# Patient Record
Sex: Male | Born: 1950
Health system: Southern US, Community
[De-identification: ages and names within clinical notes are randomized; demographics above are authoritative.]

## PROBLEM LIST (undated history)

## (undated) DIAGNOSIS — E039 Hypothyroidism, unspecified: Secondary | ICD-10-CM

## (undated) DIAGNOSIS — E079 Disorder of thyroid, unspecified: Secondary | ICD-10-CM

## (undated) DIAGNOSIS — T7840XA Allergy, unspecified, initial encounter: Secondary | ICD-10-CM

## (undated) DIAGNOSIS — M1990 Unspecified osteoarthritis, unspecified site: Secondary | ICD-10-CM

## (undated) DIAGNOSIS — K219 Gastro-esophageal reflux disease without esophagitis: Secondary | ICD-10-CM

## (undated) DIAGNOSIS — J45909 Unspecified asthma, uncomplicated: Secondary | ICD-10-CM

## (undated) DIAGNOSIS — D649 Anemia, unspecified: Secondary | ICD-10-CM

## (undated) DIAGNOSIS — E785 Hyperlipidemia, unspecified: Secondary | ICD-10-CM

## (undated) DIAGNOSIS — I10 Essential (primary) hypertension: Secondary | ICD-10-CM

## (undated) DIAGNOSIS — F32A Depression, unspecified: Secondary | ICD-10-CM

## (undated) DIAGNOSIS — F329 Major depressive disorder, single episode, unspecified: Secondary | ICD-10-CM

## (undated) DIAGNOSIS — H532 Diplopia: Secondary | ICD-10-CM

## (undated) HISTORY — DX: Major depressive disorder, single episode, unspecified: F32.9

## (undated) HISTORY — DX: Anemia, unspecified: D64.9

## (undated) HISTORY — PX: TONSILLECTOMY: SUR1361

## (undated) HISTORY — DX: Gastro-esophageal reflux disease without esophagitis: K21.9

## (undated) HISTORY — DX: Essential (primary) hypertension: I10

## (undated) HISTORY — DX: Depression, unspecified: F32.A

## (undated) HISTORY — DX: Hyperlipidemia, unspecified: E78.5

## (undated) HISTORY — DX: Disorder of thyroid, unspecified: E07.9

## (undated) HISTORY — DX: Allergy, unspecified, initial encounter: T78.40XA

---

## 2000-06-21 ENCOUNTER — Other Ambulatory Visit: Admission: RE | Admit: 2000-06-21 | Discharge: 2000-06-21 | Payer: Self-pay | Admitting: Urology

## 2003-11-24 ENCOUNTER — Emergency Department (HOSPITAL_COMMUNITY): Admission: EM | Admit: 2003-11-24 | Discharge: 2003-11-24 | Payer: Self-pay | Admitting: Emergency Medicine

## 2003-11-25 HISTORY — PX: OTHER SURGICAL HISTORY: SHX169

## 2004-09-07 ENCOUNTER — Ambulatory Visit (HOSPITAL_BASED_OUTPATIENT_CLINIC_OR_DEPARTMENT_OTHER): Admission: RE | Admit: 2004-09-07 | Discharge: 2004-09-07 | Payer: Self-pay | Admitting: Internal Medicine

## 2004-09-30 ENCOUNTER — Ambulatory Visit: Payer: Self-pay | Admitting: Internal Medicine

## 2004-11-01 ENCOUNTER — Ambulatory Visit: Payer: Self-pay | Admitting: Internal Medicine

## 2004-11-02 ENCOUNTER — Encounter (INDEPENDENT_AMBULATORY_CARE_PROVIDER_SITE_OTHER): Payer: Self-pay | Admitting: *Deleted

## 2004-11-08 ENCOUNTER — Encounter (INDEPENDENT_AMBULATORY_CARE_PROVIDER_SITE_OTHER): Payer: Self-pay | Admitting: *Deleted

## 2004-11-14 ENCOUNTER — Encounter (INDEPENDENT_AMBULATORY_CARE_PROVIDER_SITE_OTHER): Payer: Self-pay | Admitting: *Deleted

## 2004-11-18 ENCOUNTER — Encounter (INDEPENDENT_AMBULATORY_CARE_PROVIDER_SITE_OTHER): Payer: Self-pay | Admitting: *Deleted

## 2004-11-25 ENCOUNTER — Ambulatory Visit: Payer: Self-pay | Admitting: Internal Medicine

## 2004-12-01 ENCOUNTER — Ambulatory Visit: Payer: Self-pay | Admitting: Internal Medicine

## 2004-12-01 ENCOUNTER — Encounter (INDEPENDENT_AMBULATORY_CARE_PROVIDER_SITE_OTHER): Payer: Self-pay | Admitting: *Deleted

## 2004-12-06 ENCOUNTER — Encounter: Payer: Self-pay | Admitting: Internal Medicine

## 2004-12-26 ENCOUNTER — Ambulatory Visit: Payer: Self-pay | Admitting: Cardiology

## 2004-12-26 ENCOUNTER — Encounter: Payer: Self-pay | Admitting: Internal Medicine

## 2005-11-03 ENCOUNTER — Encounter (INDEPENDENT_AMBULATORY_CARE_PROVIDER_SITE_OTHER): Payer: Self-pay | Admitting: *Deleted

## 2009-09-18 HISTORY — PX: BONE MARROW TRANSPLANT: SHX200

## 2010-05-02 ENCOUNTER — Ambulatory Visit: Payer: Self-pay | Admitting: Internal Medicine

## 2010-05-02 ENCOUNTER — Inpatient Hospital Stay (HOSPITAL_COMMUNITY): Admission: EM | Admit: 2010-05-02 | Discharge: 2010-05-06 | Payer: Self-pay | Admitting: Emergency Medicine

## 2010-05-03 ENCOUNTER — Ambulatory Visit: Payer: Self-pay | Admitting: Gastroenterology

## 2010-05-04 ENCOUNTER — Encounter: Payer: Self-pay | Admitting: Internal Medicine

## 2010-05-04 ENCOUNTER — Ambulatory Visit: Payer: Self-pay | Admitting: Cardiology

## 2010-05-04 ENCOUNTER — Encounter: Payer: Self-pay | Admitting: Gastroenterology

## 2010-05-05 ENCOUNTER — Encounter: Payer: Self-pay | Admitting: Gastroenterology

## 2010-05-06 ENCOUNTER — Encounter: Payer: Self-pay | Admitting: Internal Medicine

## 2010-05-30 DIAGNOSIS — R7402 Elevation of levels of lactic acid dehydrogenase (LDH): Secondary | ICD-10-CM | POA: Insufficient documentation

## 2010-05-30 DIAGNOSIS — D638 Anemia in other chronic diseases classified elsewhere: Secondary | ICD-10-CM | POA: Insufficient documentation

## 2010-05-30 DIAGNOSIS — K279 Peptic ulcer, site unspecified, unspecified as acute or chronic, without hemorrhage or perforation: Secondary | ICD-10-CM | POA: Insufficient documentation

## 2010-05-30 DIAGNOSIS — K449 Diaphragmatic hernia without obstruction or gangrene: Secondary | ICD-10-CM | POA: Insufficient documentation

## 2010-05-30 DIAGNOSIS — G4730 Sleep apnea, unspecified: Secondary | ICD-10-CM | POA: Insufficient documentation

## 2010-05-30 DIAGNOSIS — E785 Hyperlipidemia, unspecified: Secondary | ICD-10-CM | POA: Insufficient documentation

## 2010-05-30 DIAGNOSIS — D649 Anemia, unspecified: Secondary | ICD-10-CM | POA: Insufficient documentation

## 2010-05-30 DIAGNOSIS — R740 Nonspecific elevation of levels of transaminase and lactic acid dehydrogenase [LDH]: Secondary | ICD-10-CM

## 2010-05-30 DIAGNOSIS — K222 Esophageal obstruction: Secondary | ICD-10-CM | POA: Insufficient documentation

## 2010-05-30 DIAGNOSIS — J45909 Unspecified asthma, uncomplicated: Secondary | ICD-10-CM | POA: Insufficient documentation

## 2010-05-30 DIAGNOSIS — R7401 Elevation of levels of liver transaminase levels: Secondary | ICD-10-CM | POA: Insufficient documentation

## 2010-05-31 ENCOUNTER — Ambulatory Visit: Payer: Self-pay | Admitting: Internal Medicine

## 2010-05-31 LAB — CONVERTED CEMR LAB
Eosinophils Absolute: 0.3 10*3/uL (ref 0.0–0.7)
Lymphs Abs: 0.6 10*3/uL — ABNORMAL LOW (ref 0.7–4.0)
MCHC: 34.4 g/dL (ref 30.0–36.0)
MCV: 98.1 fL (ref 78.0–100.0)
Monocytes Absolute: 0.4 10*3/uL (ref 0.1–1.0)
Neutrophils Relative %: 49.9 % (ref 43.0–77.0)
Platelets: 190 10*3/uL (ref 150.0–400.0)

## 2010-06-02 ENCOUNTER — Ambulatory Visit: Payer: Self-pay | Admitting: Internal Medicine

## 2010-06-04 LAB — CONVERTED CEMR LAB
AST: 44 units/L — ABNORMAL HIGH (ref 0–37)
Albumin: 3.8 g/dL (ref 3.5–5.2)
Alkaline Phosphatase: 72 units/L (ref 39–117)
BUN: 22 mg/dL (ref 6–23)
Ferritin: 40.1 ng/mL (ref 22.0–322.0)
Folate: >20 ng/mL
Potassium: 5.5 meq/L — ABNORMAL HIGH (ref 3.5–5.1)
Saturation Ratios: 23.3 % (ref 20.0–50.0)
Sodium: 139 meq/L (ref 135–145)
Total Bilirubin: 0.4 mg/dL (ref 0.3–1.2)
Total Protein: 8.3 g/dL (ref 6.0–8.3)
Transferrin: 282.6 mg/dL (ref 212.0–360.0)

## 2010-06-22 ENCOUNTER — Ambulatory Visit: Payer: Self-pay | Admitting: Internal Medicine

## 2010-06-22 LAB — CONVERTED CEMR LAB
Basophils Absolute: 0 10*3/uL (ref 0.0–0.1)
Eosinophils Absolute: 0.2 10*3/uL (ref 0.0–0.7)
Hemoglobin: 11.6 g/dL — ABNORMAL LOW (ref 13.0–17.0)
Iron: 80 ug/dL (ref 42–165)
Lymphocytes Relative: 41.1 % (ref 12.0–46.0)
MCHC: 35.1 g/dL (ref 30.0–36.0)
Monocytes Relative: 8.9 % (ref 3.0–12.0)
Neutro Abs: 1.5 10*3/uL (ref 1.4–7.7)
Neutrophils Relative %: 43.4 % (ref 43.0–77.0)
RDW: 19 % — ABNORMAL HIGH (ref 11.5–14.6)
Saturation Ratios: 19.4 % — ABNORMAL LOW (ref 20.0–50.0)
Transferrin: 294.6 mg/dL (ref 212.0–360.0)

## 2010-07-18 ENCOUNTER — Ambulatory Visit: Payer: Self-pay | Admitting: Internal Medicine

## 2010-07-18 LAB — CONVERTED CEMR LAB
Basophils Absolute: 0 10*3/uL (ref 0.0–0.1)
Eosinophils Relative: 4.6 % (ref 0.0–5.0)
Iron: 102 ug/dL (ref 42–165)
Lymphocytes Relative: 23.8 % (ref 12.0–46.0)
Lymphs Abs: 1.1 10*3/uL (ref 0.7–4.0)
Monocytes Relative: 8.3 % (ref 3.0–12.0)
Neutrophils Relative %: 63 % (ref 43.0–77.0)
Platelets: 211 10*3/uL (ref 150.0–400.0)
RDW: 18.3 % — ABNORMAL HIGH (ref 11.5–14.6)
Transferrin: 287.2 mg/dL (ref 212.0–360.0)
WBC: 4.5 10*3/uL (ref 4.5–10.5)

## 2010-08-15 ENCOUNTER — Ambulatory Visit: Payer: Self-pay | Admitting: Cardiology

## 2010-08-18 ENCOUNTER — Ambulatory Visit: Payer: Self-pay | Admitting: Cardiology

## 2010-08-29 ENCOUNTER — Ambulatory Visit: Payer: Self-pay | Admitting: Internal Medicine

## 2010-09-01 LAB — CONVERTED CEMR LAB
Basophils Relative: 0.3 % (ref 0.0–3.0)
Eosinophils Absolute: 0.2 10*3/uL (ref 0.0–0.7)
Eosinophils Relative: 6 % — ABNORMAL HIGH (ref 0.0–5.0)
Lymphocytes Relative: 24.5 % (ref 12.0–46.0)
MCV: 99.1 fL (ref 78.0–100.0)
Monocytes Absolute: 0.5 10*3/uL (ref 0.1–1.0)
Neutrophils Relative %: 56.5 % (ref 43.0–77.0)
Platelets: 225 10*3/uL (ref 150.0–400.0)
RBC: 3.57 M/uL — ABNORMAL LOW (ref 4.22–5.81)
WBC: 3.9 10*3/uL — ABNORMAL LOW (ref 4.5–10.5)

## 2010-09-19 ENCOUNTER — Ambulatory Visit: Payer: Self-pay | Admitting: Internal Medicine

## 2010-09-22 ENCOUNTER — Other Ambulatory Visit: Payer: Self-pay | Admitting: Internal Medicine

## 2010-09-22 ENCOUNTER — Encounter: Payer: Self-pay | Admitting: Internal Medicine

## 2010-09-22 LAB — CBC WITH DIFFERENTIAL/PLATELET
Basophils Absolute: 0 10*3/uL (ref 0.0–0.1)
Basophils Relative: 0.2 % (ref 0.0–3.0)
Eosinophils Absolute: 0.1 10*3/uL (ref 0.0–0.7)
Eosinophils Relative: 2.8 % (ref 0.0–5.0)
HCT: 36.5 % — ABNORMAL LOW (ref 39.0–52.0)
Hemoglobin: 12.5 g/dL — ABNORMAL LOW (ref 13.0–17.0)
Lymphocytes Relative: 29.9 % (ref 12.0–46.0)
Lymphs Abs: 1.4 10*3/uL (ref 0.7–4.0)
MCHC: 34.3 g/dL (ref 30.0–36.0)
MCV: 101.2 fl — ABNORMAL HIGH (ref 78.0–100.0)
Monocytes Absolute: 0.5 10*3/uL (ref 0.1–1.0)
Monocytes Relative: 9.4 % (ref 3.0–12.0)
Neutro Abs: 2.8 10*3/uL (ref 1.4–7.7)
Neutrophils Relative %: 57.7 % (ref 43.0–77.0)
Platelets: 217 10*3/uL (ref 150.0–400.0)
RBC: 3.61 Mil/uL — ABNORMAL LOW (ref 4.22–5.81)
RDW: 17 % — ABNORMAL HIGH (ref 11.5–14.6)
WBC: 4.8 10*3/uL (ref 4.5–10.5)

## 2010-09-22 LAB — CONVERTED CEMR LAB: Retic Ct Pct: 1.2 % (ref 0.4–3.1)

## 2010-09-23 LAB — HEPATIC FUNCTION PANEL
ALT: 36 U/L (ref 0–53)
AST: 37 U/L (ref 0–37)
Albumin: 3.6 g/dL (ref 3.5–5.2)
Alkaline Phosphatase: 69 U/L (ref 39–117)
Bilirubin, Direct: 0.1 mg/dL (ref 0.0–0.3)
Total Bilirubin: 0.4 mg/dL (ref 0.3–1.2)
Total Protein: 8.4 g/dL — ABNORMAL HIGH (ref 6.0–8.3)

## 2010-09-23 LAB — FERRITIN: Ferritin: 56.6 ng/mL (ref 22.0–322.0)

## 2010-09-23 LAB — IBC PANEL
Iron: 111 ug/dL (ref 42–165)
Saturation Ratios: 28.2 % (ref 20.0–50.0)
Transferrin: 281.3 mg/dL (ref 212.0–360.0)

## 2010-09-23 LAB — B12 AND FOLATE PANEL
Folate: 15.7 ng/mL
Vitamin B-12: 967 pg/mL — ABNORMAL HIGH (ref 211–911)

## 2010-09-29 ENCOUNTER — Encounter: Payer: Self-pay | Admitting: Internal Medicine

## 2010-09-29 ENCOUNTER — Ambulatory Visit
Admission: RE | Admit: 2010-09-29 | Discharge: 2010-09-29 | Payer: Self-pay | Source: Home / Self Care | Attending: Internal Medicine | Admitting: Internal Medicine

## 2010-10-04 LAB — CONVERTED CEMR LAB
Albumin ELP: 48.3 % — ABNORMAL LOW (ref 55.8–66.1)
Alpha-2-Globulin: 8.3 % (ref 7.1–11.8)
Total Protein, Serum Electrophoresis: 9.2 g/dL — ABNORMAL HIGH (ref 6.0–8.3)

## 2010-10-07 ENCOUNTER — Telehealth (INDEPENDENT_AMBULATORY_CARE_PROVIDER_SITE_OTHER): Payer: Self-pay | Admitting: *Deleted

## 2010-10-10 ENCOUNTER — Ambulatory Visit (HOSPITAL_BASED_OUTPATIENT_CLINIC_OR_DEPARTMENT_OTHER): Payer: PRIVATE HEALTH INSURANCE | Admitting: Oncology

## 2010-10-11 LAB — CONVERTED CEMR LAB
IgG (Immunoglobin G), Serum: 4190 mg/dL — ABNORMAL HIGH (ref 694–1618)
IgM, Serum: 28 mg/dL — ABNORMAL LOW (ref 60–263)
Total Protein, Serum Electrophoresis: 10.1 g/dL — ABNORMAL HIGH (ref 6.0–8.3)

## 2010-10-14 ENCOUNTER — Ambulatory Visit (HOSPITAL_COMMUNITY)
Admission: RE | Admit: 2010-10-14 | Discharge: 2010-10-14 | Payer: Self-pay | Source: Home / Self Care | Attending: Oncology | Admitting: Oncology

## 2010-10-18 NOTE — Letter (Signed)
Summary: [facility])  [name])   Imported By: Lamona Curl CMA (AAMA) 07/15/2010 17:07:01  _____________________________________________________________________  External Attachment:    Type:   Image     Comment:   External Document

## 2010-10-18 NOTE — Assessment & Plan Note (Signed)
Summary: post hospital dueodenal ulcer/GI bleed   History of Present Illness Visit Type: Initial Visit Primary GI MD: Lina Sar MD Primary Provider: Dr. Patty Sermons Chief Complaint: follow-up hospitalization for duodenal ulcer c/o weakness, dizziness, and BP drops when standing History of Present Illness:   This is a 60 year old white male who is status post hospitalization for a bleeding duodenal ulcer. He had an urgent  upper endoscopy on 05/03/10 with findings of 3 small bleeding duodenal ulcers which were injected with epinephrine. He was H. pylori negative. His hemoglobin on discharge was 10.1. He has been on Protonix 40 mg once a day. His hemoglobin 2 days ago was 10.9. He has been extremely tired and not able to do his usual exercises which includes triathlon, biking and running. His last colonoscopy in 2007 was normal. He has a history of abnormal liver function tests which are related to statins for hyperlipidemia. He denies any abdominal pain or black stools except those caused by oral iron.   GI Review of Systems      Denies abdominal pain, acid reflux, belching, bloating, chest pain, dysphagia with liquids, dysphagia with solids, heartburn, loss of appetite, nausea, vomiting, vomiting blood, weight loss, and  weight gain.        Denies anal fissure, black tarry stools, change in bowel habit, constipation, diarrhea, diverticulosis, fecal incontinence, heme positive stool, hemorrhoids, irritable bowel syndrome, jaundice, light color stool, liver problems, rectal bleeding, and  rectal pain.    Current Medications (verified): 1)  Pantoprazole Sodium 40 Mg Tbec (Pantoprazole Sodium) .... Take 1 Tablet By Mouth Two Times A Day 2)  Zocor 10 Mg Tabs (Simvastatin) .... Take 1 Tablet By Mouth Two Times A Day 3)  Melatonin (Unknown Dosage) .... Take 1 Tablet By Mouth As Needed As Needed For Insomnia 4)  Iron 325 (65 Fe) Mg Tabs (Ferrous Sulfate) .[name].. 1 By Mouth Once Daily 5)  Fish Oil 1000  Mg Caps (Omega-3 Fatty Acids) .[name].. 1 By Mouth Once Daily 6)  Selenium 200 Mcg Tabs (Selenium) .... 2 By Mouth Once Daily 7)  Vitamin C 1000 Mg Tabs (Ascorbic Acid) .... 2 By Mouth Once Daily 8)  Zetia 10 Mg Tabs (Ezetimibe) .[name].. 1 By Mouth Once Daily 9)  Allergy 4 Mg Tabs (Chlorpheniramine Maleate) .[name].. 1 By Mouth Once Daily 10)  5-Htp 50 Mg Caps (5-Hydroxytryptophan) .[name].. 1 By Mouth Once Daily 11)  Vitamin A .[name].. 1 By Mouth Once Daily  Allergies (verified): No Known Drug Allergies  Past History:  Past Medical History: Current Problems:  SCHATZKI'S RING (ICD-530.3) HIATAL HERNIA (ICD-553.3) ANEMIA (ICD-285.9) PEPTIC ULCER DISEASE (ICD-533.90) DEPRESSION (ICD-311) ASTHMA, UNSPECIFIED (ICD-493.90) HYPERLIPIDEMIA (ICD-272.4) TRANSAMINASES, SERUM, ELEVATED (ICD-790.4) SLEEP APNEA, MILD (ICD-780.57)   Anxiety Disorder  Past Surgical History: Reviewed history from 05/30/2010 and no changes required. Unremarkable  Family History: Reviewed history from 05/30/2010 and no changes required. No FH of Colon Cancer: Family History of Prostate Cancer: Maternal GrandFather Family History of Diabetes: Father Family History of Heart Disease: Father Family History of Liver Disease: Brother (hep.C) Family History of Pancreatic Cancer:Brother  Social History: Reviewed history from 05/30/2010 and no changes required. Patient currently smokes.   Quit  Alcohol Use - yes Illicit Drug Use - no Daily Caffeine Use  Review of Systems       The patient complains of allergy/sinus, anxiety-new, depression-new, itching, skin rash, and sleeping problems.  The patient denies anemia, back pain, blood in urine, breast changes/lumps, change in vision, confusion, cough, coughing up blood,  fainting, fatigue, fever, headaches-new, hearing problems, heart murmur, heart rhythm changes, menstrual pain, muscle pains/cramps, night sweats, nosebleeds, pregnancy symptoms, shortness of breath, sore throat, swelling  of feet/legs, swollen lymph glands, thirst - excessive , urination - excessive , urination changes/pain, urine leakage, vision changes, and voice change.         Pertinent positive and negative review of systems were noted in the above HPI. All other ROS was otherwise negative.   Vital Signs:  Patient profile:   60 year old male Height:      70 inches Weight:      175 pounds BMI:     25.20 Pulse rate:   64 / minute Pulse rhythm:   regular BP sitting:   120 / 76  (left arm)  Vitals Entered By: [name] (June 02, 2010 9:08 AM)  Physical Exam  General:  Well developed, well nourished, no acute distress. Neck:  Supple; no masses or thyromegaly. Lungs:  Clear throughout to auscultation. Heart:  Regular rate and rhythm; no murmurs, rubs,  or bruits. Abdomen:  Soft, nontender and nondistended. No masses, hepatosplenomegaly or hernias noted. Normal bowel sounds. Rectal:  soft Hemoccult negative stool. Extremities:  No clubbing, cyanosis, edema or deformities noted. Skin:  Intact without significant lesions or rashes. Psych:  Alert and cooperative. Normal mood and affect.   Impression & Recommendations:  Problem # 1:  ANEMIA (ICD-285.9) persistent anemia following upper GI bleed. Patient just started on iron supplements. I have given him  samples of Integra to take on a daily basis. We will check his chemistries today. Orders: TLB-CMP (Comprehensive Metabolic Pnl) (80053-COMP) TLB-B12, Serum-Total ONLY (93235-T73) TLB-Folic Acid (Folate) (82746-FOL) TLB-IBC Pnl (Iron/FE;Transferrin) (83550-IBC) TLB-Ferritin (82728-FER) TLB-Sedimentation Rate (ESR) (85652-ESR) TLB-TSH (Thyroid Stimulating Hormone) (84443-TSH)  Problem # 2:  PEPTIC ULCER DISEASE (ICD-533.90) Patient is status post major upper GI bleed from H.Pylori  duodenal ulcers. He is currently stable hemodynamically. He needs to continue on Protonix 40 mg twice a day for 2 weeks and then he can reduce to once  daily. I will see him in 8 weeks. As far as his exercises are concerned, I have asked him to reduce it at least 50%. Orders: TLB-CMP (Comprehensive Metabolic Pnl) (80053-COMP) TLB-B12, Serum-Total ONLY (22025-K27) TLB-Folic Acid (Folate) (82746-FOL) TLB-IBC Pnl (Iron/FE;Transferrin) (83550-IBC) TLB-Ferritin (82728-FER)  Patient Instructions: 1)  Reduce exercise to 50%. 2)  Integra Plus one p.o. q.d.[name] 3)   metabolic panel, sedimentation rate, anemia panel, TSH. 4)  Protonix 40 mg p.o. Erland.i.d. 5)  Office visit 8 weeks. 6)  Copy sent to : Dr Patty Sermons 7)  The medication list was reviewed and reconciled.  All changed / newly prescribed medications were explained.  A complete medication list was provided to the patient / caregiver.

## 2010-10-18 NOTE — Letter (Signed)
Summary: Letter to Patient from [facility]  Letter to Patient from [facility]   Imported By: Lamona Curl CMA (AAMA) 06/01/2010 16:21:30  _____________________________________________________________________  External Attachment:    Type:   Image     Comment:   External Document

## 2010-10-18 NOTE — Assessment & Plan Note (Signed)
Summary: Gastroenterology  DURWIN DAVISSON MR#:  [id number] Page #  NAME:  Andre [name], Andre [name]  OFFICE NO:  [id number]  DATE:  11/25/04  DOB:  2050/09/29  HISTORY OF PRESENT ILLNESS:  The patient is a 60 year old white male who comes for evaluation of abnormal liver function tests.  We had received a call from him yesterday, and he faxed [place] the results from his recent physical at [place], which showed elevation of his transaminases.  We also have received copies of his liver function tests, which were repeated yesterday at Dr. Yevonne Pax office.  The liver function tests are as follows:  In May 2004, liver function tests were normal prior to him starting Lipitor.  The next liver function test done in June 2005 showed elevation of transaminases but normal bilirubin and normal alkaline phosphatase.  Lipitor was discontinued, and he was subsequently put on Crestor, which he stayed on, and then at some point changed to Zetia.  Liver tests November 01, 2004, showed AST 38, ALT 54, alkaline phosphatase 78, bilirubin 7 with GGT 152.  At that time, his iron saturation was 30%, hepatitis A antibody IgG was positive, IgM was negative, anti-HBc was negative, anti-HBs was negative, and hepatitis Osias surface antigen was negative.  Also, his hepatitis C antibody was negative.  His TSH was normal.  He was taken off his lipid-lowering agents, and on November 10, 2004, AST was 138 with ALT of 127, normal alkaline phosphatase, bilirubin, elevation of GGT to 207.  Next liver test check was on November 18, 2004 - AST 50, ALT 69, and GGT of 157 with normal rest of the liver function tests.  Yesterday in Dr. Yevonne Pax office, his repeat liver function tests on November 23, 2004, showed AST 98, ALT 101 with normal alkaline phosphatase 98, total bilirubin 0.3.  The patient has no history of liver problems.  He is feeling well energy wise and has no digestive problems to suggest gallbladder disease.  In fact, he had an upper abdominal ultrasound in  [facility] 2 weeks ago, which showed normal liver.  Splenic size was not described in the note, but the gallbladder appeared entirely normal, and common bile duct was normal size.  The patient uses alcohol socially.  He drinks 1 or 2 glasses of wine a day and about 1 or 2 drinks a week.  About once or twice a month, he and his wife drink a whole bottle of wine.  He has many days that he does not drink any alcohol and some days he drinks 1 or 2 beers but no other liquor.  He takes 2 Tylenol PM at night every night.  His brother was diagnosed with hepatitis C.  He was using drugs as a teenager and was in drug rehabilitation, and he is being treated for hepatitis C.  The patient lived with him in college when his brother was using drugs.  The patient himself has never used intravenous drugs or any types of injections.  He has never had blood transfusions.  He tried once to give blood at the age of about 64 but was refused for it.  He does not know the reason for it.  There is no other family history of liver disease.    MEDICATIONS:  Zetia 10 mg daily was discontinued about 2 weeks ago, Nexium 40 mg a day for acid reflux, Zoloft 200 mg q.h.s. was discontinued, Allegra 180 mg p.o. daily, Advair 100/50 p.r.n., Tylenol PM 2 at bedtime, Crestor 10 mg  was discontinued, Ziac 2.5/6.25 mg p.o. daily, and albuterol inhaler.   PAST HISTORY:  Significant for high blood pressure, asthmatic bronchitis, sleep apnea, hyperlipidemia, depression.  No operations.   FAMILY HISTORY:  Positive for heart disease and prostate cancer in his father and diabetes, hepatitis C in one brother.  SOCIAL HISTORY:  He is married.  He has 2 children.  College graduate.  He is a Animator.  He smokes cigars 2-3 a week.  Drinks alcohol as mentioned above.   REVIEW OF SYSTEMS:  Allergies, skin rashes diagnosed as eczema, sleeping problems, night sweats, itching.   PHYSICAL EXAMINATION:  Blood pressure 110/68.  Weight pounds  196 pounds.  Pulse 66.  He was in no distress.  Skin was warm and dry.  There was no palmar erythema, no Dupuytren contractions.  Sclerae were anicteric.  There were no spider nevi anywhere throughout his anterior chest or arms.  Also, there was no visible eczema.  Lungs were clear to auscultation.  Neck was supple.  Thyroid was not enlarged.  COR with quiet precordium; normal S1; normal S2.  Abdomen was soft and relaxed.  The liver edge was at the costal margin.  On percussion, I could not percuss the upper border of his liver because of increased tympany, but his liver was not enlarged and did not appear to be tender.  Splenic tip was not palpable.  Bowel sounds were normoactive.  Rectal exam not done.  Extremities:  No edema.    IMPRESSION:  A 60 year old white male with mild elevation of transaminases with normal bilirubin.  We have documented normal liver function tests from 2 years ago.  Liver abnormalities seem to have been documented since he was started on statin agents for hyperlipidemia.  The more likely reason for abnormal liver function tests would be then drug related.  Other possibility would be fatty liver.  Although as reported from the ultrasound from [place], his liver texture appeared normal.  He has no symptoms to suggest biliary dysfunction and structurally his gallbladder appears normal on the ultrasound.  Again, this is just from the report.  Other possibility would be some hereditary liver disease such as autoimmune liver disease, alpha-1 antitrypsin, unlikely Wilson's disease.  He has been checked for hepatitis A, Jakyle, and C, but we may have to repeat his hepatitis C serologies because his brother has hepatitis C, and patient has been exposed to him over the years.  We will check hepatitis C RNA by PCR.  He certainly does not show any evidence of chronic liver disease or chronic dysfunction of the liver as evidenced by normal serum albumin levels.    PLAN:  1.  I would like to  personally review the upper abdominal ultrasound to look at the splenic size and the texture of the liver.  2.  Patient has discontinued alcohol and Tylenol as well as statin medications.   3.  Obtain alpha-1 antitrypsin and ceruloplasmin levels, IgG and IgM levels, and ANA titer as well as prothrombin time.  I would hold off on his hyperlipidemic agents until we can assess the synthetic liver function.  If we do not get the upper abdominal ultrasound for review, we may need to obtain our own ultrasound to get more information about his liver and spleen.    I have told the patient that his liver impairment is not serious and that often we may need to observe the liver functions over a period of a year or 2 to see  the pattern.  Nothing necessarily has to be done about it.  I do not feel that a liver biopsy needs to be done right away unless we would see some benefit in potential treatment.        Hedwig Morton. Juanda Chance, M.D.  [id number] cc:  Cassell Clement, MD cc:  Macario Carls, MD, Internal Medicine       [facility]       [address]       [place], [place]  [address]  D:  11/25/04; T:  ; Job [phone]

## 2010-10-18 NOTE — Letter (Signed)
Summary: New Patient letter  [name]  [address] [place], [place] [address]   Phone: [phone]  Fax: [phone]       05/06/2010 MRN: [id number]  [facility] [address] [place], [place]  [address]  Dear Andre [name],  Welcome to the Gastroenterology Division at [facility].    You are scheduled to see Dr.  Juanda Chance  on 05/02/10 at 8:45 on the 3rd floor at [facility], [address]  We ask that you try to arrive at our office 15 minutes prior to your appointment time to allow for check-in.  We would like you to complete the enclosed self-administered evaluation form prior to your visit and bring it with you on the day of your appointment.  We will review it with you.  Also, please bring a complete list of all your medications or, if you prefer, bring the medication bottles and we will list them.  Please bring your insurance card so that we may make a copy of it.  If your insurance requires a referral to see a specialist, please bring your referral form from your primary care physician.  Co-payments are due at the time of your visit and may be paid by cash, check or credit card.     Your office visit will consist of a consult with your physician (includes a physical exam), any laboratory testing he/she may order, scheduling of any necessary diagnostic testing (e.g. x-ray, ultrasound, CT-scan), and scheduling of a procedure (e.g. Endoscopy, Colonoscopy) if required.  Please allow enough time on your schedule to allow for any/all of these possibilities.    If you cannot keep your appointment, please call [phone] to cancel or reschedule prior to your appointment date.  This allows [place] the opportunity to schedule an appointment for another patient in need of care.  If you do not cancel or reschedule by 5 p.m. the business day prior to your appointment date, you will be charged a $50.00 late cancellation/no-show fee.    Thank you for  choosing [facility] for your medical needs.  We appreciate the opportunity to care for you.  Please visit [place] at our website  to learn more about our practice.                     Sincerely,                                                             The Gastroenterology Division

## 2010-10-18 NOTE — Letter (Signed)
Summary: Gastroenterology  [facility]   Office No. [id number] Page December 06, 2004    Douglas L. Yetta Barre, MD [facility] [address] [place], [place] [address]  RE:     Andre [name]   Office No. [id number] DOB:  04/27/2051  Dear  Dr. Yetta Barre:   I had the pleasure of seeing your patient, [name], for evaluation of abnormal liver function tests. I appreciate your forwarding your complete and thorough physical exam from November 02, 2004, which included Andre [name] history as well as blood and urine tests and ultrasound of the liver and upper abdomen. I am enclosing my initial consultation note from November 25, 2004.  Andre [name] blood tests have come back showing mild elevation of autoimmune markers pertaining to possible mild inflammation of the liver. Specifically, his IgG gammaglobulin was elevated at 2077, while his IgM was normal. His ANA titer, however, was negative, but his anti-smooth muscle antibody IgG was elevated with a titer of 1:32, which is definitely positive, as well as the IgG titer of 1:20. As you remember, his sedimentation rate and C-reactive protein were mildly elevated.  Review of his upper abdominal ultrasound shows normal-sized spleen and no evidence of portal hypertension. His synthetic liver function was normal as far as his serum albumin and coagulation studies were concerned.  I have discussed this case with my associates, who agree that the patient most likely has a low-activity autoimmune liver disease which is not causing any impairment of the synthetic or other activities and probably is not definite enough to institute treatment, which in this case would have to be immunosuppressant medications. In fact, we would not recommend treatment. We would not recommend diagnostic liver biopsies because it would not likely result in any therapeutic intervention.  Our plan would be to continue to follow his liver function tests over a  period of the next 3 months and again in 6 months over a period of a year or two. If there is any evidence of a deterioration of the liver function or if he truly develops symptoms of autoimmune disease elsewhere, we would probably be interested in proceeding with a liver biopsy. I have talked to Andre [name] about it, and he agrees with the plans.  I will be in touch with you concerning his followup liver function tests, which will be repeated in June of 2006. It is a pleasure to take part in Andre [name] care.  Best regards,     Hedwig Morton. Juanda Chance, M.D.  Enclosure  [id number] D:  12/06/04; T:  ; Job [phone]

## 2010-10-18 NOTE — Procedures (Signed)
Summary: Upper Endoscopy  Patient: Andre [name] Note: All result statuses are Final unless otherwise noted.  Tests: (1) Upper Endoscopy (EGD)   EGD Upper Endoscopy       DONE     [facility]     [address]     [place], [place]  [address]           ENDOSCOPY PROCEDURE REPORT           PATIENT:  Andre [name], Andre [name]  MR#:  [id number]     BIRTHDATE:  04/05/1951, 58 yrs. old  GENDER:  male           ENDOSCOPIST:  Jamarquis Crull. Arlyce Dice, MD     Referred by:           PROCEDURE DATE:  05/03/2010     PROCEDURE:  EGD with biopsy     ASA CLASS:  Class II     INDICATIONS:  melena           MEDICATIONS:   Fentanyl 25 mcg IV, Versed 5 mg IV     TOPICAL ANESTHETIC:  Cetacaine Spray           DESCRIPTION OF PROCEDURE:   After the risks benefits and     alternatives of the procedure were thoroughly explained, informed     consent was obtained.  The EG-[id number] ([id number]) endoscope was     introduced through the mouth and advanced to the third portion of     the duodenum, without limitations.  The instrument was slowly     withdrawn as the mucosa was fully examined.     <<PROCEDUREIMAGES>>           Multiple ulcers were found in the bulb of the duodenum. 3 discreet     ulcers measuring 3-108mm at apex of bulb, 2 on anterior wall and 3rd     on posterior wall - all actively bleeding. 13cc epinephrine was     injected in divided portions into each ulcer achieving hemostasis.     Bx taken to r/o H. pylori (see image002, image005, image010, and     image011).  A Schatzki's ring was found at the gastroesophageal     junction (see image013).  A hiatal hernia was found. 3cm sliding     hiatal hernia  Otherwise the examination was normal.     Retroflexed views revealed no abnormalities.    The scope was then     withdrawn from the patient and the procedure completed.           COMPLICATIONS:  None           ENDOSCOPIC IMPRESSION:     1) Ulcers, multiple in the bulb of duodenum - s/p  epinephrine     injection with hemostasis     2) Schatzki's ring at the gastroesophageal junction     3) Hiatal hernia     4) Otherwise normal examination     RECOMMENDATIONS:Protonix drip           REPEAT EXAM:  No           ______________________________     Barbette Hair. Arlyce Dice, MD           CC:  Cassell Clement, MD           n.     Rosalie DoctorBarbette Hair. Kaplan at 05/03/2010 05:23 PM           Duwaine Maxin, [id number]  Note: An exclamation mark (!) indicates a result that was not dispersed into the flowsheet. Document Creation Date: 05/03/2010 5:25 PM _______________________________________________________________________  (1) Order result status: Final Collection or observation date-time: 05/03/2010 17:11 Requested date-time:  Receipt date-time:  Reported date-time:  Referring Physician:   Ordering Physician: Melvia Heaps [phone]) Specimen Source:  Source: Launa Grill Order Number: [id number] Lab site:

## 2010-10-18 NOTE — Letter (Signed)
Summary: [facility]  [facility]   Imported By: Lamona Curl CMA (AAMA) 06/01/2010 16:18:45  _____________________________________________________________________  External Attachment:    Type:   Image     Comment:   External Document

## 2010-10-18 NOTE — Letter (Signed)
Summary: [facility]  [employer]   Imported By: Lamona Curl CMA (AAMA) 06/01/2010 16:23:51  _____________________________________________________________________  External Attachment:    Type:   Image     Comment:   External Document

## 2010-10-18 NOTE — Letter (Signed)
Summary: Letter to pt from [facility]  Letter to pt from [facility]   Imported By: Lamona Curl CMA (AAMA) 06/01/2010 16:19:27  _____________________________________________________________________  External Attachment:    Type:   Image     Comment:   External Document

## 2010-10-18 NOTE — Letter (Signed)
Summary: [facility]   Imported By: Lamona Curl CMA (AAMA) 06/01/2010 16:23:13  _____________________________________________________________________  External Attachment:    Type:   Image     Comment:   External Document

## 2010-10-18 NOTE — Consult Note (Signed)
Summary: [place]  [facility]   Imported By: Lester Bloomfield 05/30/2010 11:03:21  _____________________________________________________________________  External Attachment:    Type:   Image     Comment:   External Document

## 2010-10-18 NOTE — Consult Note (Signed)
Summary: GI Bleed    [name], Andre [name]              ACCOUNT NO.:  [id number]      MEDICAL RECORD NO.:  [id number]           PATIENT TYPE:      LOCATION:                                 FACILITY:      PHYSICIAN:  Currie Paris, M.D.DATE OF BIRTH:  09/11/1951      DATE OF CONSULTATION:  05/04/2010   DATE OF DISCHARGE:                                    CONSULTATION      REQUESTING PHYSICIAN:  Ileana Roup, MD      GASTROENTEROLOGIST:  Barbette Hair. Arlyce Dice, MD, Clementeen Graham      REASON FOR CONSULTATION:  Upper GI bleed secondary to peptic ulcer   disease.      HISTORY OF PRESENT ILLNESS:  [name] is a 60 year old white male   with a history of hyperlipidemia but otherwise in good health who   developed weakness and shortness of breath this past Monday while at   work.  He is otherwise in very good health running marathons, biking,   and swimming.  After walking up 3 flights of steps, the patient   developed shortness of breath and worsening weakness.  Following a   meeting, he then developed sweats and felt like he was going to passed   out.  At this time, EMS was called and the patient was brought to the   emergency department.  At this time, workup was negative for any cardiac   indications; however, it did reveal anemia.  The patient then was found   to have heme-positive stools.  He does admit to having darker black   melanotic stools for the last week along with constipation.   Gastroenterology was consulted and an endoscopy was performed which   revealed three bleeding duodenal bulb ulcers with visible vessel.  These   were injected with epinephrine and bleeding ceased.  Overnight, the   patient did have a slight decrease in his hemoglobin to 7.9 and the   patient was transfused 2 units of packed red blood cells.  This morning,   his hemoglobin is up to 9.9.  The patient denies any abdominal pain at   any point in time.  At this time, we are asked to evaluate the  patient   to be on standby in case the patient were to rebleed and need emergent   surgical intervention.      REVIEW OF SYSTEMS:  Please see HPI.  Otherwise, all other systems are   negative.      FAMILY HISTORY:  Noncontributory.      PAST MEDICAL HISTORY:  Hyperlipidemia.      PAST SURGICAL HISTORY:  None.      SOCIAL HISTORY:  The patient has 2 daughters.  He is a Surveyor, minerals.  He   denies any alcohol or tobacco.      ALLERGIES:  NKDA.      Medications at home include:   1. Over-the-counter Tylenol p.r.n.   2. Melatonin 3 mg at bedtime daily as needed.   3. Selenium  400 mg daily.   4. Vitamin A.   5. Fish oil.   6. Vitamin C.   7. Over-the-counter 5-HTP.   8. Aspirin 81 mg daily.   9. Zyrtec 10 mg daily.   10.Doxycycline 100 mg Khyran.i.d.  He has been on a 14-day course.   11.Zetia 10 mg daily.      PHYSICAL EXAMINATION:  GENERAL:  [name] is a very pleasant 58-year-   old white male who is well developed and well nourished and currently in   bed in no acute distress.   VITAL SIGNS:  Temperature 97.9, pulse 62, blood pressure 129/64,   respirations are 16.   HEENT:  Head is normocephalic, atraumatic.  Sclerae noninjected.  Pupils   are equal, round, and reactive to light.  Ears and nose without any   obvious masses or lesions.  No rhinorrhea.  Mouth is pink and moist.   Throat shows no exudate.   HEART:  Regular rate and rhythm.  Normal S1 and S2.  No murmurs,   gallops, or rubs are noted.  He does have palpable carotid, radial, and   pedal pulses bilaterally.   LUNGS:  Clear to auscultation bilaterally with no wheezes, rhonchi, or   rales noted.  Respiratory effort is nonlabored.   ABDOMEN:  Soft, nontender, nondistended with active bowel sounds.  No   masses, scars, hernias, or organomegaly are noted.   MUSCULOSKELETAL:  All four extremities are symmetrical with no cyanosis,   clubbing, or edema.   NEUROLOGIC:  Cranial nerves II through XII appear to be grossly  intact.   Deep tendon reflex exam is deferred at this time.   PSYCHIATRIC:  The patient is alert and oriented x3 with an appropriate   affect.      LABORATORY DATA AND DIAGNOSTICS:  White blood cell count 7100,   hemoglobin 9.9 which is up to 7.9 overnight, hematocrit 29.5, platelet   count is 133,000.  Sodium 138, potassium 4.3, glucose 98, BUN 29,   creatinine 0.88.  Endoscopy revealed three discrete ulcers measuring 3-4   mm at the apex of the bulb.  They were all actively bleeding and   epinephrine was injected to achieve hemostasis.  Biopsy was taken to   rule out H. pylori which is currently pending.      IMPRESSION:   1. Upper gastrointestinal bleed secondary to three bleeding duodenal       bulb ulcers.   2. Hyperlipidemia.      PLAN:  At this time, the patient is currently stable and is tolerating   clear liquids.  I agree with GI evaluation and management with PPIs.  If   the patient remains stable, he will need appropriate GI follow up.   However, if the patient begins to acutely bleed, he may require urgent   or emergent surgical intervention for which we will be available for.   We will follow the patient along with you in case we are needed.   Otherwise, I agree with the current care that the patient is receiving   by his primary medical team as well as the gastroenterologist.               Letha Cape, PA         ______________________________   Currie Paris, M.D.      KEO/MEDQ  D:  05/04/2010  T:  05/04/2010  Job:  [id number]      cc:   Nilda Simmer.  Meredith Pel, M.D.   Barbette Hair. Arlyce Dice, MD,FACG      Electronically Signed by Barnetta Chapel PA on 05/12/2010 10:17:54 AM   Electronically Signed by Cyndia Bent M.D. on 05/12/2010 12:21:20 PM

## 2010-10-18 NOTE — Letter (Signed)
Summary: [facility]  [facility]   Imported By: Lamona Curl CMA (AAMA) 06/01/2010 16:22:45  _____________________________________________________________________  External Attachment:    Type:   Image     Comment:   External Document

## 2010-10-20 ENCOUNTER — Encounter: Payer: Self-pay | Admitting: Internal Medicine

## 2010-10-20 ENCOUNTER — Encounter (HOSPITAL_BASED_OUTPATIENT_CLINIC_OR_DEPARTMENT_OTHER): Payer: PRIVATE HEALTH INSURANCE | Admitting: Oncology

## 2010-10-20 DIAGNOSIS — K219 Gastro-esophageal reflux disease without esophagitis: Secondary | ICD-10-CM

## 2010-10-20 DIAGNOSIS — C9000 Multiple myeloma not having achieved remission: Secondary | ICD-10-CM

## 2010-10-20 DIAGNOSIS — E785 Hyperlipidemia, unspecified: Secondary | ICD-10-CM

## 2010-10-20 LAB — COMPREHENSIVE METABOLIC PANEL
ALT: 43 U/L (ref 0–53)
Albumin: 3.5 g/dL (ref 3.5–5.2)
CO2: 26 mEq/L (ref 19–32)
Calcium: 9.2 mg/dL (ref 8.4–10.5)
Chloride: 105 mEq/L (ref 96–112)
Glucose, Bld: 82 mg/dL (ref 70–99)
Potassium: 4.3 mEq/L (ref 3.5–5.3)
Sodium: 136 mEq/L (ref 135–145)
Total Protein: 9 g/dL — ABNORMAL HIGH (ref 6.0–8.3)

## 2010-10-20 LAB — LACTATE DEHYDROGENASE: LDH: 187 U/L (ref 94–250)

## 2010-10-20 LAB — CBC WITH DIFFERENTIAL/PLATELET
BASO%: 0.3 % (ref 0.0–2.0)
Eosinophils Absolute: 0.1 10*3/uL (ref 0.0–0.5)
MCHC: 34.3 g/dL (ref 32.0–36.0)
MONO#: 0.4 10*3/uL (ref 0.1–0.9)
NEUT#: 2 10*3/uL (ref 1.5–6.5)
RBC: 3.42 10*6/uL — ABNORMAL LOW (ref 4.20–5.82)
WBC: 3.6 10*3/uL — ABNORMAL LOW (ref 4.0–10.3)
lymph#: 1.1 10*3/uL (ref 0.9–3.3)

## 2010-10-20 NOTE — Progress Notes (Signed)
----   Converted from flag ---- ---- 10/07/2010 2:47 PM, Hart Carwin MD wrote: Rene Kocher, could you,please, set up an appointment with Dr Ruthann Cancer at [facility] with regards to his IgG gammopathy. I have spoken to Dr Darnelle Catalan. Please forward all his records to him ( especiaslly all the SPEP and Immunoelectrophoresis). Thanx DB. Please give them pt's mobile phone #  to contact him. ------------------------------  Phone Note Outgoing Call   Call placed by: Jesse Fall, RN Call placed to: Chrys Racer Summary of Call: Faxed patient records and referral form to Chrys Racer new patient coordinator at [facility]. Left message for her to let me know if she got all the records. Initial call taken by: Jesse Fall RN,  October 07, 2010 4:14 PM  Follow-up for Phone Call        Spoke with Luster Landsberg. She got the records and they are on Dr. Darrall Dears desk for review. She will call with appt.  Follow-up by: Jesse Fall RN,  October 10, 2010 1:30 PM

## 2010-10-21 ENCOUNTER — Other Ambulatory Visit (HOSPITAL_COMMUNITY)
Admission: RE | Admit: 2010-10-21 | Discharge: 2010-10-21 | Disposition: A | Discharge status: Home / Self Care | Payer: PRIVATE HEALTH INSURANCE | Source: Ambulatory Visit | Attending: Oncology | Admitting: Oncology

## 2010-10-21 ENCOUNTER — Other Ambulatory Visit: Payer: Self-pay | Admitting: Oncology

## 2010-10-21 ENCOUNTER — Encounter: Payer: PRIVATE HEALTH INSURANCE | Admitting: Oncology

## 2010-10-21 DIAGNOSIS — C9000 Multiple myeloma not having achieved remission: Secondary | ICD-10-CM

## 2010-10-21 DIAGNOSIS — D72822 Plasmacytosis: Secondary | ICD-10-CM | POA: Insufficient documentation

## 2010-10-21 DIAGNOSIS — D649 Anemia, unspecified: Secondary | ICD-10-CM | POA: Insufficient documentation

## 2010-10-21 DIAGNOSIS — D72819 Decreased white blood cell count, unspecified: Secondary | ICD-10-CM | POA: Insufficient documentation

## 2010-10-24 ENCOUNTER — Other Ambulatory Visit: Payer: Self-pay | Admitting: Oncology

## 2010-10-25 LAB — PROTEIN ELECTROPHORESIS, SERUM
Alpha-1-Globulin: 3.7 % (ref 2.9–4.9)
Beta 2: 3 % — ABNORMAL LOW (ref 3.2–6.5)
Gamma Globulin: 32.3 % — ABNORMAL HIGH (ref 11.1–18.8)
M-Spike, %: 2.6 g/dL

## 2010-10-26 LAB — UIFE/LIGHT CHAINS/TP QN, 24-HR UR
Albumin, U: DETECTED
Alpha 1, Urine: DETECTED — AB
Beta, Urine: DETECTED — AB
Free Lambda Excretion/Day: 448.2 mg/d
Free Lambda Lt Chains,Ur: 16.6 mg/dL — ABNORMAL HIGH (ref 0.08–1.01)
Total Protein, Urine-Ur/day: 510 mg/d — ABNORMAL HIGH (ref 10–140)
Volume, Urine: 2700 mL

## 2010-10-28 ENCOUNTER — Encounter (HOSPITAL_BASED_OUTPATIENT_CLINIC_OR_DEPARTMENT_OTHER): Payer: PRIVATE HEALTH INSURANCE | Admitting: Oncology

## 2010-10-28 ENCOUNTER — Encounter: Payer: Self-pay | Admitting: Internal Medicine

## 2010-10-28 DIAGNOSIS — C9000 Multiple myeloma not having achieved remission: Secondary | ICD-10-CM

## 2010-11-14 ENCOUNTER — Encounter (HOSPITAL_BASED_OUTPATIENT_CLINIC_OR_DEPARTMENT_OTHER): Payer: PRIVATE HEALTH INSURANCE | Admitting: Oncology

## 2010-11-14 DIAGNOSIS — E785 Hyperlipidemia, unspecified: Secondary | ICD-10-CM

## 2010-11-14 DIAGNOSIS — C9000 Multiple myeloma not having achieved remission: Secondary | ICD-10-CM

## 2010-11-14 DIAGNOSIS — K219 Gastro-esophageal reflux disease without esophagitis: Secondary | ICD-10-CM

## 2010-11-15 NOTE — Letter (Signed)
Summary: [facility]  [facility]   Imported By: Sherian Rein 11/07/2010 07:48:17  _____________________________________________________________________  External Attachment:    Type:   Image     Comment:   External Document

## 2010-11-17 ENCOUNTER — Encounter (HOSPITAL_BASED_OUTPATIENT_CLINIC_OR_DEPARTMENT_OTHER): Payer: PRIVATE HEALTH INSURANCE | Admitting: Oncology

## 2010-11-17 ENCOUNTER — Other Ambulatory Visit: Payer: Self-pay | Admitting: Oncology

## 2010-11-17 DIAGNOSIS — C9000 Multiple myeloma not having achieved remission: Secondary | ICD-10-CM

## 2010-11-17 DIAGNOSIS — E785 Hyperlipidemia, unspecified: Secondary | ICD-10-CM

## 2010-11-17 DIAGNOSIS — K219 Gastro-esophageal reflux disease without esophagitis: Secondary | ICD-10-CM

## 2010-11-17 DIAGNOSIS — Z5112 Encounter for antineoplastic immunotherapy: Secondary | ICD-10-CM

## 2010-11-17 LAB — PROTIME-INR: INR: 1.1 — ABNORMAL LOW (ref 2.00–3.50)

## 2010-11-17 LAB — CBC WITH DIFFERENTIAL/PLATELET
BASO%: 0.3 % (ref 0.0–2.0)
HCT: 34 % — ABNORMAL LOW (ref 38.4–49.9)
MCHC: 34.1 g/dL (ref 32.0–36.0)
MONO#: 0.2 10*3/uL (ref 0.1–0.9)
RBC: 3.43 10*6/uL — ABNORMAL LOW (ref 4.20–5.82)
WBC: 3.4 10*3/uL — ABNORMAL LOW (ref 4.0–10.3)
lymph#: 1 10*3/uL (ref 0.9–3.3)
nRBC: 0 % (ref 0–0)

## 2010-11-21 ENCOUNTER — Ambulatory Visit: Payer: Self-pay | Admitting: Internal Medicine

## 2010-11-21 ENCOUNTER — Encounter (HOSPITAL_BASED_OUTPATIENT_CLINIC_OR_DEPARTMENT_OTHER): Payer: PRIVATE HEALTH INSURANCE | Admitting: Oncology

## 2010-11-21 ENCOUNTER — Other Ambulatory Visit: Payer: Self-pay | Admitting: Oncology

## 2010-11-21 DIAGNOSIS — K219 Gastro-esophageal reflux disease without esophagitis: Secondary | ICD-10-CM

## 2010-11-21 DIAGNOSIS — C9000 Multiple myeloma not having achieved remission: Secondary | ICD-10-CM

## 2010-11-21 DIAGNOSIS — E785 Hyperlipidemia, unspecified: Secondary | ICD-10-CM

## 2010-11-21 DIAGNOSIS — Z5112 Encounter for antineoplastic immunotherapy: Secondary | ICD-10-CM

## 2010-11-21 LAB — CBC WITH DIFFERENTIAL/PLATELET
BASO%: 0.4 % (ref 0.0–2.0)
EOS%: 1.4 % (ref 0.0–7.0)
HCT: 35.7 % — ABNORMAL LOW (ref 38.4–49.9)
LYMPH%: 30.5 % (ref 14.0–49.0)
MCH: 34.7 pg — ABNORMAL HIGH (ref 27.2–33.4)
MCHC: 34.6 g/dL (ref 32.0–36.0)
NEUT%: 62.2 % (ref 39.0–75.0)
Platelets: 206 10*3/uL (ref 140–400)

## 2010-11-24 ENCOUNTER — Other Ambulatory Visit: Payer: Self-pay | Admitting: Oncology

## 2010-11-24 ENCOUNTER — Encounter (HOSPITAL_BASED_OUTPATIENT_CLINIC_OR_DEPARTMENT_OTHER): Payer: PRIVATE HEALTH INSURANCE | Admitting: Oncology

## 2010-11-24 DIAGNOSIS — C9000 Multiple myeloma not having achieved remission: Secondary | ICD-10-CM

## 2010-11-24 DIAGNOSIS — E785 Hyperlipidemia, unspecified: Secondary | ICD-10-CM

## 2010-11-24 DIAGNOSIS — Z5112 Encounter for antineoplastic immunotherapy: Secondary | ICD-10-CM

## 2010-11-24 DIAGNOSIS — K219 Gastro-esophageal reflux disease without esophagitis: Secondary | ICD-10-CM

## 2010-11-24 LAB — CBC WITH DIFFERENTIAL/PLATELET
BASO%: 0 % (ref 0.0–2.0)
Basophils Absolute: 0 10*3/uL (ref 0.0–0.1)
HCT: 35.6 % — ABNORMAL LOW (ref 38.4–49.9)
LYMPH%: 5.1 % — ABNORMAL LOW (ref 14.0–49.0)
MCH: 33.5 pg — ABNORMAL HIGH (ref 27.2–33.4)
MCHC: 34.3 g/dL (ref 32.0–36.0)
MONO#: 0.1 10*3/uL (ref 0.1–0.9)
NEUT%: 93.9 % — ABNORMAL HIGH (ref 39.0–75.0)
Platelets: 179 10*3/uL (ref 140–400)
WBC: 6.3 10*3/uL (ref 4.0–10.3)

## 2010-11-24 LAB — COMPREHENSIVE METABOLIC PANEL
AST: 38 U/L — ABNORMAL HIGH (ref 0–37)
BUN: 29 mg/dL — ABNORMAL HIGH (ref 6–23)
CO2: 19 mEq/L (ref 19–32)
Calcium: 9.1 mg/dL (ref 8.4–10.5)
Chloride: 102 mEq/L (ref 96–112)
Creatinine, Ser: 1.04 mg/dL (ref 0.40–1.50)
Glucose, Bld: 180 mg/dL — ABNORMAL HIGH (ref 70–99)

## 2010-11-24 LAB — IGG: IgG (Immunoglobin G), Serum: 3370 mg/dL — ABNORMAL HIGH (ref 694–1618)

## 2010-11-24 NOTE — Letter (Signed)
Summary: Ruthann Cancer MD  Ruthann Cancer MD   Imported By: Lester Faison 11/17/2010 10:48:52  _____________________________________________________________________  External Attachment:    Type:   Image     Comment:   External Document

## 2010-11-28 ENCOUNTER — Other Ambulatory Visit: Payer: Self-pay | Admitting: Oncology

## 2010-11-28 ENCOUNTER — Encounter (HOSPITAL_BASED_OUTPATIENT_CLINIC_OR_DEPARTMENT_OTHER): Payer: PRIVATE HEALTH INSURANCE | Admitting: Oncology

## 2010-11-28 DIAGNOSIS — K219 Gastro-esophageal reflux disease without esophagitis: Secondary | ICD-10-CM

## 2010-11-28 DIAGNOSIS — Z5181 Encounter for therapeutic drug level monitoring: Secondary | ICD-10-CM

## 2010-11-28 DIAGNOSIS — Z7901 Long term (current) use of anticoagulants: Secondary | ICD-10-CM

## 2010-11-28 DIAGNOSIS — Z5112 Encounter for antineoplastic immunotherapy: Secondary | ICD-10-CM

## 2010-11-28 LAB — CBC WITH DIFFERENTIAL/PLATELET
Basophils Absolute: 0 10*3/uL (ref 0.0–0.1)
Eosinophils Absolute: 0.1 10*3/uL (ref 0.0–0.5)
HGB: 12.2 g/dL — ABNORMAL LOW (ref 13.0–17.1)
MONO#: 0.3 10*3/uL (ref 0.1–0.9)
NEUT#: 4.6 10*3/uL (ref 1.5–6.5)
RBC: 3.64 10*6/uL — ABNORMAL LOW (ref 4.20–5.82)
RDW: 16.3 % — ABNORMAL HIGH (ref 11.0–14.6)
WBC: 5.9 10*3/uL (ref 4.0–10.3)
lymph#: 0.8 10*3/uL — ABNORMAL LOW (ref 0.9–3.3)

## 2010-11-28 LAB — PROTIME-INR
INR: 2.7 (ref 2.00–3.50)
Protime: 32.4 Seconds — ABNORMAL HIGH (ref 10.6–13.4)

## 2010-12-01 ENCOUNTER — Encounter (HOSPITAL_BASED_OUTPATIENT_CLINIC_OR_DEPARTMENT_OTHER): Payer: PRIVATE HEALTH INSURANCE | Admitting: Oncology

## 2010-12-01 ENCOUNTER — Other Ambulatory Visit: Payer: Self-pay | Admitting: Oncology

## 2010-12-01 DIAGNOSIS — C9000 Multiple myeloma not having achieved remission: Secondary | ICD-10-CM

## 2010-12-01 DIAGNOSIS — E785 Hyperlipidemia, unspecified: Secondary | ICD-10-CM

## 2010-12-01 DIAGNOSIS — Z7901 Long term (current) use of anticoagulants: Secondary | ICD-10-CM

## 2010-12-01 LAB — CBC WITH DIFFERENTIAL/PLATELET
Basophils Absolute: 0 10*3/uL (ref 0.0–0.1)
EOS%: 0.2 % (ref 0.0–7.0)
Eosinophils Absolute: 0 10*3/uL (ref 0.0–0.5)
HGB: 12.7 g/dL — ABNORMAL LOW (ref 13.0–17.1)
NEUT#: 7.6 10*3/uL — ABNORMAL HIGH (ref 1.5–6.5)
RBC: 3.76 10*6/uL — ABNORMAL LOW (ref 4.20–5.82)
RDW: 16.5 % — ABNORMAL HIGH (ref 11.0–14.6)
lymph#: 0.5 10*3/uL — ABNORMAL LOW (ref 0.9–3.3)
nRBC: 0 % (ref 0–0)

## 2010-12-01 LAB — PROTIME-INR
INR: 2.6 (ref 2.00–3.50)
Protime: 31.2 Seconds — ABNORMAL HIGH (ref 10.6–13.4)

## 2010-12-02 LAB — COMPREHENSIVE METABOLIC PANEL
ALT: 39 U/L (ref 0–53)
AST: 37 U/L (ref 0–37)
Albumin: 3 g/dL — ABNORMAL LOW (ref 3.5–5.2)
Chloride: 105 mEq/L (ref 96–112)
Creatinine, Ser: 0.94 mg/dL (ref 0.4–1.5)
GFR calc Af Amer: >60 mL/min (ref >60)
Sodium: 135 mEq/L (ref 135–145)
Total Bilirubin: 0.4 mg/dL (ref 0.3–1.2)

## 2010-12-02 LAB — BASIC METABOLIC PANEL
BUN: 15 mg/dL (ref 6–23)
BUN: 26 mg/dL — ABNORMAL HIGH (ref 6–23)
CO2: 25 mEq/L (ref 19–32)
Calcium: 8.7 mg/dL (ref 8.4–10.5)
Calcium: 9.2 mg/dL (ref 8.4–10.5)
Calcium: 9.2 mg/dL (ref 8.4–10.5)
Chloride: 109 mEq/L (ref 96–112)
Chloride: 109 mEq/L (ref 96–112)
Creatinine, Ser: 0.95 mg/dL (ref 0.4–1.5)
Creatinine, Ser: 1 mg/dL (ref 0.4–1.5)
GFR calc Af Amer: >60 mL/min (ref >60)
GFR calc Af Amer: >60 mL/min (ref >60)
GFR calc Af Amer: >60 mL/min (ref >60)
GFR calc non Af Amer: >60 mL/min (ref >60)
GFR calc non Af Amer: >60 mL/min (ref >60)
Glucose, Bld: 100 mg/dL — ABNORMAL HIGH (ref 70–99)
Glucose, Bld: 98 mg/dL (ref 70–99)
Potassium: 3.8 mEq/L (ref 3.5–5.1)
Potassium: 4.3 mEq/L (ref 3.5–5.1)
Sodium: 138 mEq/L (ref 135–145)
Sodium: 138 mEq/L (ref 135–145)

## 2010-12-02 LAB — CBC
HCT: 22.6 % — ABNORMAL LOW (ref 39.0–52.0)
HCT: 27.7 % — ABNORMAL LOW (ref 39.0–52.0)
HCT: 28.7 % — ABNORMAL LOW (ref 39.0–52.0)
HCT: 28.9 % — ABNORMAL LOW (ref 39.0–52.0)
HCT: 29.5 % — ABNORMAL LOW (ref 39.0–52.0)
HCT: 30.4 % — ABNORMAL LOW (ref 39.0–52.0)
HCT: 30.7 % — ABNORMAL LOW (ref 39.0–52.0)
Hemoglobin: 10.1 g/dL — ABNORMAL LOW (ref 13.0–17.0)
Hemoglobin: 9.1 g/dL — ABNORMAL LOW (ref 13.0–17.0)
Hemoglobin: 9.9 g/dL — ABNORMAL LOW (ref 13.0–17.0)
MCH: 31.5 pg (ref 26.0–34.0)
MCH: 31.8 pg (ref 26.0–34.0)
MCH: 32.1 pg (ref 26.0–34.0)
MCH: 32.7 pg (ref 26.0–34.0)
MCH: 33.2 pg (ref 26.0–34.0)
MCH: 34.1 pg — ABNORMAL HIGH (ref 26.0–34.0)
MCHC: 33.1 g/dL (ref 30.0–36.0)
MCHC: 33.6 g/dL (ref 30.0–36.0)
MCHC: 33.6 g/dL (ref 30.0–36.0)
MCHC: 34.1 g/dL (ref 30.0–36.0)
MCHC: 34.9 g/dL (ref 30.0–36.0)
MCHC: 35.1 g/dL (ref 30.0–36.0)
MCHC: 35.1 g/dL (ref 30.0–36.0)
MCV: 100.3 fL — ABNORMAL HIGH (ref 78.0–100.0)
MCV: 100.4 fL — ABNORMAL HIGH (ref 78.0–100.0)
MCV: 101.7 fL — ABNORMAL HIGH (ref 78.0–100.0)
MCV: 91.5 fL (ref 78.0–100.0)
MCV: 93.2 fL (ref 78.0–100.0)
MCV: 93.9 fL (ref 78.0–100.0)
MCV: 99.6 fL (ref 78.0–100.0)
Platelets: 139 10*3/uL — ABNORMAL LOW (ref 150–400)
Platelets: 155 10*3/uL (ref 150–400)
Platelets: 158 10*3/uL (ref 150–400)
Platelets: 158 10*3/uL (ref 150–400)
Platelets: 159 10*3/uL (ref 150–400)
Platelets: 171 10*3/uL (ref 150–400)
Platelets: 180 10*3/uL (ref 150–400)
RBC: 2.27 MIL/uL — ABNORMAL LOW (ref 4.22–5.81)
RBC: 2.28 MIL/uL — ABNORMAL LOW (ref 4.22–5.81)
RBC: 2.61 MIL/uL — ABNORMAL LOW (ref 4.22–5.81)
RBC: 2.76 MIL/uL — ABNORMAL LOW (ref 4.22–5.81)
RBC: 3.05 MIL/uL — ABNORMAL LOW (ref 4.22–5.81)
RBC: 3.14 MIL/uL — ABNORMAL LOW (ref 4.22–5.81)
RBC: 3.27 MIL/uL — ABNORMAL LOW (ref 4.22–5.81)
RDW: 15.1 % (ref 11.5–15.5)
RDW: 15.2 % (ref 11.5–15.5)
RDW: 15.3 % (ref 11.5–15.5)
RDW: 19 % — ABNORMAL HIGH (ref 11.5–15.5)
RDW: 19.1 % — ABNORMAL HIGH (ref 11.5–15.5)
WBC: 5.2 10*3/uL (ref 4.0–10.5)
WBC: 5.2 10*3/uL (ref 4.0–10.5)
WBC: 5.6 10*3/uL (ref 4.0–10.5)
WBC: 5.8 10*3/uL (ref 4.0–10.5)
WBC: 6.1 10*3/uL (ref 4.0–10.5)
WBC: 7.1 10*3/uL (ref 4.0–10.5)
WBC: 7.8 10*3/uL (ref 4.0–10.5)
WBC: 8 10*3/uL (ref 4.0–10.5)

## 2010-12-02 LAB — CARDIAC PANEL(CRET KIN+CKTOT+MB+TROPI)
CK, MB: 2.1 ng/mL (ref 0.3–4.0)
Relative Index: 1.9 (ref 0.0–2.5)
Total CK: 111 U/L (ref 7–232)
Troponin I: 0.01 ng/mL (ref 0.00–0.06)
Troponin I: 0.01 ng/mL (ref 0.00–0.06)

## 2010-12-02 LAB — CROSSMATCH: Antibody Screen: NEGATIVE

## 2010-12-02 LAB — DIFFERENTIAL
Basophils Absolute: 0 10*3/uL (ref 0.0–0.1)
Basophils Absolute: 0 10*3/uL (ref 0.0–0.1)
Basophils Relative: 0 % (ref 0–1)
Basophils Relative: 0 % (ref 0–1)
Eosinophils Absolute: 0.4 10*3/uL (ref 0.0–0.7)
Eosinophils Relative: 5 % (ref 0–5)
Lymphocytes Relative: 15 % (ref 12–46)
Lymphocytes Relative: 25 % (ref 12–46)
Monocytes Absolute: 0.4 10*3/uL (ref 0.1–1.0)
Monocytes Absolute: 0.4 10*3/uL (ref 0.1–1.0)
Monocytes Relative: 7 % (ref 3–12)
Neutro Abs: 3 10*3/uL (ref 1.7–7.7)
Neutrophils Relative %: 58 % (ref 43–77)

## 2010-12-02 LAB — POCT I-STAT, CHEM 8
Creatinine, Ser: 0.9 mg/dL (ref 0.4–1.5)
HCT: 32 % — ABNORMAL LOW (ref 39.0–52.0)
Hemoglobin: 10.9 g/dL — ABNORMAL LOW (ref 13.0–17.0)
Potassium: 4.2 mEq/L (ref 3.5–5.1)
Sodium: 138 mEq/L (ref 135–145)
TCO2: 24 mmol/L (ref 0–100)

## 2010-12-02 LAB — URINALYSIS, ROUTINE W REFLEX MICROSCOPIC
Glucose, UA: NEGATIVE mg/dL
Hgb urine dipstick: NEGATIVE
Ketones, ur: 15 mg/dL — AB
Protein, ur: NEGATIVE mg/dL
Urobilinogen, UA: 0.2 mg/dL (ref 0.0–1.0)

## 2010-12-02 LAB — POCT CARDIAC MARKERS: Myoglobin, poc: 68.2 ng/mL (ref 12–200)

## 2010-12-02 LAB — IRON AND TIBC
Saturation Ratios: 43 % (ref 20–55)
TIBC: 272 ug/dL (ref 215–435)

## 2010-12-02 LAB — GLUCOSE, CAPILLARY: Glucose-Capillary: 94 mg/dL (ref 70–99)

## 2010-12-02 LAB — HEMOCCULT GUIAC POC 1CARD (OFFICE): Fecal Occult Bld: NEGATIVE

## 2010-12-02 LAB — MRSA PCR SCREENING
MRSA by PCR: NEGATIVE
MRSA by PCR: NEGATIVE

## 2010-12-02 LAB — VITAMIN B12: Vitamin B-12: 571 pg/mL (ref 211–911)

## 2010-12-02 LAB — FERRITIN: Ferritin: 78 ng/mL (ref 22–322)

## 2010-12-02 LAB — TROPONIN I: Troponin I: 0.02 ng/mL (ref 0.00–0.06)

## 2010-12-02 LAB — FOLATE: Folate: 8.2 ng/mL

## 2010-12-02 LAB — TSH: TSH: 2.175 u[IU]/mL (ref 0.350–4.500)

## 2010-12-02 LAB — HAPTOGLOBIN: Haptoglobin: 85 mg/dL (ref 16–200)

## 2010-12-02 LAB — LACTATE DEHYDROGENASE: LDH: 178 U/L (ref 94–250)

## 2010-12-02 LAB — CK TOTAL AND CKMB (NOT AT ARMC): Relative Index: 2.1 (ref 0.0–2.5)

## 2010-12-08 ENCOUNTER — Other Ambulatory Visit: Payer: Self-pay | Admitting: Oncology

## 2010-12-08 ENCOUNTER — Encounter (HOSPITAL_BASED_OUTPATIENT_CLINIC_OR_DEPARTMENT_OTHER): Payer: PRIVATE HEALTH INSURANCE | Admitting: Oncology

## 2010-12-08 DIAGNOSIS — E785 Hyperlipidemia, unspecified: Secondary | ICD-10-CM

## 2010-12-08 DIAGNOSIS — Z5112 Encounter for antineoplastic immunotherapy: Secondary | ICD-10-CM

## 2010-12-08 DIAGNOSIS — K219 Gastro-esophageal reflux disease without esophagitis: Secondary | ICD-10-CM

## 2010-12-08 DIAGNOSIS — C9000 Multiple myeloma not having achieved remission: Secondary | ICD-10-CM

## 2010-12-08 LAB — PROTIME-INR: INR: 2.7 (ref 2.00–3.50)

## 2010-12-08 LAB — CBC WITH DIFFERENTIAL/PLATELET
BASO%: 0 % (ref 0.0–2.0)
EOS%: 0.2 % (ref 0.0–7.0)
MCH: 34.7 pg — ABNORMAL HIGH (ref 27.2–33.4)
MCHC: 34.5 g/dL (ref 32.0–36.0)
MONO#: 0.1 10*3/uL (ref 0.1–0.9)
RDW: 17.1 % — ABNORMAL HIGH (ref 11.0–14.6)
WBC: 5.5 10*3/uL (ref 4.0–10.3)
lymph#: 0.2 10*3/uL — ABNORMAL LOW (ref 0.9–3.3)

## 2010-12-12 ENCOUNTER — Other Ambulatory Visit: Payer: Self-pay | Admitting: Oncology

## 2010-12-12 ENCOUNTER — Encounter (HOSPITAL_BASED_OUTPATIENT_CLINIC_OR_DEPARTMENT_OTHER): Payer: PRIVATE HEALTH INSURANCE | Admitting: Oncology

## 2010-12-12 DIAGNOSIS — E785 Hyperlipidemia, unspecified: Secondary | ICD-10-CM

## 2010-12-12 DIAGNOSIS — K219 Gastro-esophageal reflux disease without esophagitis: Secondary | ICD-10-CM

## 2010-12-12 DIAGNOSIS — C9000 Multiple myeloma not having achieved remission: Secondary | ICD-10-CM

## 2010-12-12 DIAGNOSIS — Z5112 Encounter for antineoplastic immunotherapy: Secondary | ICD-10-CM

## 2010-12-12 LAB — PROTIME-INR
INR: 1.6 — ABNORMAL LOW (ref 2.00–3.50)
Protime: 19.2 Seconds — ABNORMAL HIGH (ref 10.6–13.4)

## 2010-12-12 LAB — PROTEIN ELECTROPHORESIS, SERUM
Albumin ELP: 49.3 % — ABNORMAL LOW (ref 55.8–66.1)
Beta 2: 3.7 % (ref 3.2–6.5)
Total Protein, Serum Electrophoresis: 7.3 g/dL (ref 6.0–8.3)

## 2010-12-12 LAB — CBC WITH DIFFERENTIAL/PLATELET
Basophils Absolute: 0 10*3/uL (ref 0.0–0.1)
EOS%: 0.6 % (ref 0.0–7.0)
Eosinophils Absolute: 0 10*3/uL (ref 0.0–0.5)
HCT: 34.2 % — ABNORMAL LOW (ref 38.4–49.9)
HGB: 11.5 g/dL — ABNORMAL LOW (ref 13.0–17.1)
MONO#: 0.2 10*3/uL (ref 0.1–0.9)
NEUT#: 2.9 10*3/uL (ref 1.5–6.5)
NEUT%: 83.1 % — ABNORMAL HIGH (ref 39.0–75.0)
RDW: 16.5 % — ABNORMAL HIGH (ref 11.0–14.6)
WBC: 3.5 10*3/uL — ABNORMAL LOW (ref 4.0–10.3)
lymph#: 0.4 10*3/uL — ABNORMAL LOW (ref 0.9–3.3)

## 2010-12-12 LAB — KAPPA/LAMBDA LIGHT CHAINS
Kappa free light chain: 1.3 mg/dL (ref 0.33–1.94)
Kappa:Lambda Ratio: 0.32 (ref 0.26–1.65)

## 2010-12-15 ENCOUNTER — Other Ambulatory Visit: Payer: Self-pay | Admitting: Oncology

## 2010-12-15 ENCOUNTER — Encounter (HOSPITAL_BASED_OUTPATIENT_CLINIC_OR_DEPARTMENT_OTHER): Payer: PRIVATE HEALTH INSURANCE | Admitting: Oncology

## 2010-12-15 DIAGNOSIS — Z5112 Encounter for antineoplastic immunotherapy: Secondary | ICD-10-CM

## 2010-12-15 DIAGNOSIS — E785 Hyperlipidemia, unspecified: Secondary | ICD-10-CM

## 2010-12-15 DIAGNOSIS — Z7901 Long term (current) use of anticoagulants: Secondary | ICD-10-CM

## 2010-12-15 DIAGNOSIS — C9000 Multiple myeloma not having achieved remission: Secondary | ICD-10-CM

## 2010-12-15 DIAGNOSIS — K219 Gastro-esophageal reflux disease without esophagitis: Secondary | ICD-10-CM

## 2010-12-15 LAB — CBC WITH DIFFERENTIAL/PLATELET
BASO%: 0.2 % (ref 0.0–2.0)
EOS%: 0.7 % (ref 0.0–7.0)
HCT: 34.8 % — ABNORMAL LOW (ref 38.4–49.9)
LYMPH%: 11.9 % — ABNORMAL LOW (ref 14.0–49.0)
MCH: 33.5 pg — ABNORMAL HIGH (ref 27.2–33.4)
MCHC: 34.2 g/dL (ref 32.0–36.0)
NEUT%: 82.1 % — ABNORMAL HIGH (ref 39.0–75.0)
RBC: 3.55 10*6/uL — ABNORMAL LOW (ref 4.20–5.82)
lymph#: 0.5 10*3/uL — ABNORMAL LOW (ref 0.9–3.3)

## 2010-12-19 ENCOUNTER — Encounter (HOSPITAL_BASED_OUTPATIENT_CLINIC_OR_DEPARTMENT_OTHER): Payer: PRIVATE HEALTH INSURANCE | Admitting: Oncology

## 2010-12-19 ENCOUNTER — Other Ambulatory Visit: Payer: Self-pay | Admitting: Oncology

## 2010-12-19 DIAGNOSIS — K219 Gastro-esophageal reflux disease without esophagitis: Secondary | ICD-10-CM

## 2010-12-19 DIAGNOSIS — Z5112 Encounter for antineoplastic immunotherapy: Secondary | ICD-10-CM

## 2010-12-19 DIAGNOSIS — C9000 Multiple myeloma not having achieved remission: Secondary | ICD-10-CM

## 2010-12-19 DIAGNOSIS — E785 Hyperlipidemia, unspecified: Secondary | ICD-10-CM

## 2010-12-19 LAB — CBC WITH DIFFERENTIAL/PLATELET
BASO%: 0.2 % (ref 0.0–2.0)
Eosinophils Absolute: 0.1 10*3/uL (ref 0.0–0.5)
LYMPH%: 10.3 % — ABNORMAL LOW (ref 14.0–49.0)
MCHC: 34.2 g/dL (ref 32.0–36.0)
MONO#: 0.4 10*3/uL (ref 0.1–0.9)
NEUT#: 4.4 10*3/uL (ref 1.5–6.5)
Platelets: 144 10*3/uL (ref 140–400)
RBC: 3.5 10*6/uL — ABNORMAL LOW (ref 4.20–5.82)
WBC: 5.4 10*3/uL (ref 4.0–10.3)
lymph#: 0.6 10*3/uL — ABNORMAL LOW (ref 0.9–3.3)
nRBC: 0 % (ref 0–0)

## 2010-12-19 LAB — PROTEIN ELECTROPHORESIS, SERUM
Alpha-2-Globulin: 11 % (ref 7.1–11.8)
Beta 2: 3.8 % (ref 3.2–6.5)
Beta Globulin: 6.6 % (ref 4.7–7.2)
M-Spike, %: 1.2 g/dL

## 2010-12-19 LAB — COMPREHENSIVE METABOLIC PANEL
ALT: 91 U/L — ABNORMAL HIGH (ref 0–53)
AST: 77 U/L — ABNORMAL HIGH (ref 0–37)
Alkaline Phosphatase: 91 U/L (ref 39–117)
Creatinine, Ser: 1.14 mg/dL (ref 0.40–1.50)
Sodium: 136 mEq/L (ref 135–145)
Total Bilirubin: 0.3 mg/dL (ref 0.3–1.2)
Total Protein: 7.3 g/dL (ref 6.0–8.3)

## 2010-12-19 LAB — PROTIME-INR
INR: 1.8 — ABNORMAL LOW (ref 2.00–3.50)
Protime: 21.6 Seconds — ABNORMAL HIGH (ref 10.6–13.4)

## 2010-12-19 LAB — KAPPA/LAMBDA LIGHT CHAINS: Lambda Free Lght Chn: 3.18 mg/dL — ABNORMAL HIGH (ref 0.57–2.63)

## 2010-12-25 ENCOUNTER — Emergency Department (HOSPITAL_COMMUNITY)
Admission: EM | Admit: 2010-12-25 | Discharge: 2010-12-25 | Disposition: A | Discharge status: Home / Self Care | Payer: PRIVATE HEALTH INSURANCE | Attending: Emergency Medicine | Admitting: Emergency Medicine

## 2010-12-25 DIAGNOSIS — M729 Fibroblastic disorder, unspecified: Secondary | ICD-10-CM

## 2010-12-25 DIAGNOSIS — M5430 Sciatica, unspecified side: Secondary | ICD-10-CM | POA: Insufficient documentation

## 2010-12-25 DIAGNOSIS — Z859 Personal history of malignant neoplasm, unspecified: Secondary | ICD-10-CM | POA: Insufficient documentation

## 2010-12-25 DIAGNOSIS — Z7901 Long term (current) use of anticoagulants: Secondary | ICD-10-CM | POA: Insufficient documentation

## 2010-12-25 DIAGNOSIS — M7989 Other specified soft tissue disorders: Secondary | ICD-10-CM | POA: Insufficient documentation

## 2010-12-29 ENCOUNTER — Ambulatory Visit (HOSPITAL_COMMUNITY)
Admission: RE | Admit: 2010-12-29 | Discharge: 2010-12-29 | Disposition: A | Discharge status: Home / Self Care | Payer: PRIVATE HEALTH INSURANCE | Source: Ambulatory Visit | Attending: Oncology | Admitting: Oncology

## 2010-12-29 ENCOUNTER — Other Ambulatory Visit: Payer: Self-pay | Admitting: Oncology

## 2010-12-29 ENCOUNTER — Encounter (HOSPITAL_BASED_OUTPATIENT_CLINIC_OR_DEPARTMENT_OTHER): Payer: PRIVATE HEALTH INSURANCE | Admitting: Oncology

## 2010-12-29 DIAGNOSIS — M4120 Other idiopathic scoliosis, site unspecified: Secondary | ICD-10-CM | POA: Insufficient documentation

## 2010-12-29 DIAGNOSIS — M899 Disorder of bone, unspecified: Secondary | ICD-10-CM | POA: Insufficient documentation

## 2010-12-29 DIAGNOSIS — K219 Gastro-esophageal reflux disease without esophagitis: Secondary | ICD-10-CM

## 2010-12-29 DIAGNOSIS — Z5112 Encounter for antineoplastic immunotherapy: Secondary | ICD-10-CM

## 2010-12-29 DIAGNOSIS — C9000 Multiple myeloma not having achieved remission: Secondary | ICD-10-CM | POA: Insufficient documentation

## 2010-12-29 DIAGNOSIS — M545 Low back pain, unspecified: Secondary | ICD-10-CM | POA: Insufficient documentation

## 2010-12-29 DIAGNOSIS — M1610 Unilateral primary osteoarthritis, unspecified hip: Secondary | ICD-10-CM | POA: Insufficient documentation

## 2010-12-29 DIAGNOSIS — M25559 Pain in unspecified hip: Secondary | ICD-10-CM | POA: Insufficient documentation

## 2010-12-29 DIAGNOSIS — M949 Disorder of cartilage, unspecified: Secondary | ICD-10-CM | POA: Insufficient documentation

## 2010-12-29 DIAGNOSIS — Z5181 Encounter for therapeutic drug level monitoring: Secondary | ICD-10-CM

## 2010-12-29 DIAGNOSIS — Z7901 Long term (current) use of anticoagulants: Secondary | ICD-10-CM

## 2010-12-29 DIAGNOSIS — M169 Osteoarthritis of hip, unspecified: Secondary | ICD-10-CM | POA: Insufficient documentation

## 2010-12-29 DIAGNOSIS — E785 Hyperlipidemia, unspecified: Secondary | ICD-10-CM

## 2010-12-29 LAB — CBC WITH DIFFERENTIAL/PLATELET
Basophils Absolute: 0 10*3/uL (ref 0.0–0.1)
Eosinophils Absolute: 0 10*3/uL (ref 0.0–0.5)
HGB: 12.5 g/dL — ABNORMAL LOW (ref 13.0–17.1)
LYMPH%: 7.5 % — ABNORMAL LOW (ref 14.0–49.0)
MCV: 99.2 fL — ABNORMAL HIGH (ref 79.3–98.0)
MONO#: 0.1 10*3/uL (ref 0.1–0.9)
MONO%: 1.1 % (ref 0.0–14.0)
NEUT#: 6.6 10*3/uL — ABNORMAL HIGH (ref 1.5–6.5)
Platelets: 190 10*3/uL (ref 140–400)
RBC: 3.71 10*6/uL — ABNORMAL LOW (ref 4.20–5.82)
RDW: 16.6 % — ABNORMAL HIGH (ref 11.0–14.6)
WBC: 7.2 10*3/uL (ref 4.0–10.3)
nRBC: 0 % (ref 0–0)

## 2010-12-29 LAB — PROTIME-INR
INR: 1.6 — ABNORMAL LOW (ref 2.00–3.50)
Protime: 19.2 Seconds — ABNORMAL HIGH (ref 10.6–13.4)

## 2011-01-02 ENCOUNTER — Encounter (HOSPITAL_BASED_OUTPATIENT_CLINIC_OR_DEPARTMENT_OTHER): Payer: PRIVATE HEALTH INSURANCE | Admitting: Oncology

## 2011-01-02 ENCOUNTER — Other Ambulatory Visit: Payer: Self-pay | Admitting: Oncology

## 2011-01-02 DIAGNOSIS — C9000 Multiple myeloma not having achieved remission: Secondary | ICD-10-CM

## 2011-01-02 DIAGNOSIS — K219 Gastro-esophageal reflux disease without esophagitis: Secondary | ICD-10-CM

## 2011-01-02 DIAGNOSIS — Z5181 Encounter for therapeutic drug level monitoring: Secondary | ICD-10-CM

## 2011-01-02 DIAGNOSIS — Z5112 Encounter for antineoplastic immunotherapy: Secondary | ICD-10-CM

## 2011-01-02 DIAGNOSIS — M25551 Pain in right hip: Secondary | ICD-10-CM

## 2011-01-02 DIAGNOSIS — Z7901 Long term (current) use of anticoagulants: Secondary | ICD-10-CM

## 2011-01-02 LAB — PROTEIN ELECTROPHORESIS, SERUM
Albumin ELP: 55.3 % — ABNORMAL LOW (ref 55.8–66.1)
Beta Globulin: 7.3 % — ABNORMAL HIGH (ref 4.7–7.2)
M-Spike, %: 0.81 g/dL
Total Protein, Serum Electrophoresis: 7 g/dL (ref 6.0–8.3)

## 2011-01-02 LAB — CBC WITH DIFFERENTIAL/PLATELET
BASO%: 0.1 % (ref 0.0–2.0)
EOS%: 1.1 % (ref 0.0–7.0)
LYMPH%: 11.8 % — ABNORMAL LOW (ref 14.0–49.0)
MCH: 34.7 pg — ABNORMAL HIGH (ref 27.2–33.4)
MCHC: 34 g/dL (ref 32.0–36.0)
MCV: 102 fL — ABNORMAL HIGH (ref 79.3–98.0)
MONO%: 11.4 % (ref 0.0–14.0)
NEUT#: 4.4 10*3/uL (ref 1.5–6.5)
Platelets: 193 10*3/uL (ref 140–400)
RBC: 3.27 10*6/uL — ABNORMAL LOW (ref 4.20–5.82)
RDW: 16.7 % — ABNORMAL HIGH (ref 11.0–14.6)

## 2011-01-02 LAB — COMPREHENSIVE METABOLIC PANEL
ALT: 105 U/L — ABNORMAL HIGH (ref 0–53)
Albumin: 3.9 g/dL (ref 3.5–5.2)
Alkaline Phosphatase: 99 U/L (ref 39–117)
CO2: 22 mEq/L (ref 19–32)
Glucose, Bld: 128 mg/dL — ABNORMAL HIGH (ref 70–99)
Potassium: 4.1 mEq/L (ref 3.5–5.3)
Sodium: 138 mEq/L (ref 135–145)
Total Protein: 7 g/dL (ref 6.0–8.3)

## 2011-01-02 LAB — PROTIME-INR: INR: 1.7 — ABNORMAL LOW (ref 2.00–3.50)

## 2011-01-04 ENCOUNTER — Ambulatory Visit (HOSPITAL_COMMUNITY)
Admission: RE | Admit: 2011-01-04 | Discharge: 2011-01-04 | Disposition: A | Discharge status: Home / Self Care | Payer: PRIVATE HEALTH INSURANCE | Source: Ambulatory Visit | Attending: Oncology | Admitting: Oncology

## 2011-01-04 DIAGNOSIS — S3210XA Unspecified fracture of sacrum, initial encounter for closed fracture: Secondary | ICD-10-CM | POA: Insufficient documentation

## 2011-01-04 DIAGNOSIS — C9000 Multiple myeloma not having achieved remission: Secondary | ICD-10-CM | POA: Insufficient documentation

## 2011-01-04 DIAGNOSIS — M25551 Pain in right hip: Secondary | ICD-10-CM

## 2011-01-04 DIAGNOSIS — M25559 Pain in unspecified hip: Secondary | ICD-10-CM | POA: Insufficient documentation

## 2011-01-04 DIAGNOSIS — X58XXXA Exposure to other specified factors, initial encounter: Secondary | ICD-10-CM | POA: Insufficient documentation

## 2011-01-04 MED ORDER — GADOBENATE DIMEGLUMINE 529 MG/ML IV SOLN
20.0000 mL | Freq: Once | INTRAVENOUS | Status: AC | PRN
  Administered 2011-01-04: 20 mL via INTRAVENOUS

## 2011-01-05 ENCOUNTER — Other Ambulatory Visit: Payer: Self-pay | Admitting: Oncology

## 2011-01-05 ENCOUNTER — Encounter (HOSPITAL_BASED_OUTPATIENT_CLINIC_OR_DEPARTMENT_OTHER): Payer: PRIVATE HEALTH INSURANCE | Admitting: Oncology

## 2011-01-05 DIAGNOSIS — C9000 Multiple myeloma not having achieved remission: Secondary | ICD-10-CM

## 2011-01-05 DIAGNOSIS — K219 Gastro-esophageal reflux disease without esophagitis: Secondary | ICD-10-CM

## 2011-01-05 DIAGNOSIS — E785 Hyperlipidemia, unspecified: Secondary | ICD-10-CM

## 2011-01-05 DIAGNOSIS — Z5112 Encounter for antineoplastic immunotherapy: Secondary | ICD-10-CM

## 2011-01-05 LAB — PROTIME-INR
INR: 2.2 (ref 2.00–3.50)
Protime: 26.4 Seconds — ABNORMAL HIGH (ref 10.6–13.4)

## 2011-01-05 LAB — CBC WITH DIFFERENTIAL/PLATELET
BASO%: 0.7 % (ref 0.0–2.0)
EOS%: 3.3 % (ref 0.0–7.0)
HCT: 35 % — ABNORMAL LOW (ref 38.4–49.9)
MCH: 33.1 pg (ref 27.2–33.4)
MCHC: 33.4 g/dL (ref 32.0–36.0)
MONO#: 0.4 10*3/uL (ref 0.1–0.9)
NEUT%: 78.4 % — ABNORMAL HIGH (ref 39.0–75.0)
RBC: 3.53 10*6/uL — ABNORMAL LOW (ref 4.20–5.82)
RDW: 16.4 % — ABNORMAL HIGH (ref 11.0–14.6)
WBC: 5.5 10*3/uL (ref 4.0–10.3)
lymph#: 0.6 10*3/uL — ABNORMAL LOW (ref 0.9–3.3)
nRBC: 0 % (ref 0–0)

## 2011-01-05 LAB — BASIC METABOLIC PANEL
BUN: 19 mg/dL (ref 6–23)
Calcium: 8.9 mg/dL (ref 8.4–10.5)
Creatinine, Ser: 1.01 mg/dL (ref 0.40–1.50)

## 2011-01-09 ENCOUNTER — Other Ambulatory Visit: Payer: Self-pay | Admitting: Oncology

## 2011-01-09 ENCOUNTER — Encounter (HOSPITAL_BASED_OUTPATIENT_CLINIC_OR_DEPARTMENT_OTHER): Payer: PRIVATE HEALTH INSURANCE | Admitting: Oncology

## 2011-01-09 DIAGNOSIS — K219 Gastro-esophageal reflux disease without esophagitis: Secondary | ICD-10-CM

## 2011-01-09 DIAGNOSIS — E785 Hyperlipidemia, unspecified: Secondary | ICD-10-CM

## 2011-01-09 DIAGNOSIS — Z5112 Encounter for antineoplastic immunotherapy: Secondary | ICD-10-CM

## 2011-01-09 DIAGNOSIS — C9000 Multiple myeloma not having achieved remission: Secondary | ICD-10-CM

## 2011-01-09 LAB — PROTIME-INR
INR: 2.2 (ref 2.00–3.50)
Protime: 26.4 Seconds — ABNORMAL HIGH (ref 10.6–13.4)

## 2011-01-09 LAB — CBC WITH DIFFERENTIAL/PLATELET
BASO%: 0 % (ref 0.0–2.0)
Basophils Absolute: 0 10*3/uL (ref 0.0–0.1)
EOS%: 0.5 % (ref 0.0–7.0)
MCH: 34.9 pg — ABNORMAL HIGH (ref 27.2–33.4)
MCHC: 34.4 g/dL (ref 32.0–36.0)
MCV: 101.4 fL — ABNORMAL HIGH (ref 79.3–98.0)
MONO%: 2.7 % (ref 0.0–14.0)
RDW: 16.5 % — ABNORMAL HIGH (ref 11.0–14.6)
lymph#: 0.4 10*3/uL — ABNORMAL LOW (ref 0.9–3.3)

## 2011-01-11 ENCOUNTER — Encounter (HOSPITAL_BASED_OUTPATIENT_CLINIC_OR_DEPARTMENT_OTHER): Payer: PRIVATE HEALTH INSURANCE | Admitting: Oncology

## 2011-01-11 DIAGNOSIS — K5900 Constipation, unspecified: Secondary | ICD-10-CM

## 2011-01-11 DIAGNOSIS — C9000 Multiple myeloma not having achieved remission: Secondary | ICD-10-CM

## 2011-01-11 DIAGNOSIS — IMO0002 Reserved for concepts with insufficient information to code with codable children: Secondary | ICD-10-CM

## 2011-01-11 DIAGNOSIS — M8448XA Pathological fracture, other site, initial encounter for fracture: Secondary | ICD-10-CM

## 2011-01-11 LAB — PROTEIN ELECTROPHORESIS, SERUM
Albumin ELP: 56.1 % (ref 55.8–66.1)
Alpha-1-Globulin: 5.6 % — ABNORMAL HIGH (ref 2.9–4.9)
Beta 2: 4.2 % (ref 3.2–6.5)
Gamma Globulin: 13.5 % (ref 11.1–18.8)

## 2011-01-11 LAB — KAPPA/LAMBDA LIGHT CHAINS: Kappa free light chain: 1.13 mg/dL (ref 0.33–1.94)

## 2011-01-19 ENCOUNTER — Other Ambulatory Visit: Payer: Self-pay | Admitting: Oncology

## 2011-01-19 ENCOUNTER — Encounter (HOSPITAL_BASED_OUTPATIENT_CLINIC_OR_DEPARTMENT_OTHER): Payer: PRIVATE HEALTH INSURANCE | Admitting: Oncology

## 2011-01-19 DIAGNOSIS — K219 Gastro-esophageal reflux disease without esophagitis: Secondary | ICD-10-CM

## 2011-01-19 DIAGNOSIS — C9000 Multiple myeloma not having achieved remission: Secondary | ICD-10-CM

## 2011-01-19 DIAGNOSIS — E785 Hyperlipidemia, unspecified: Secondary | ICD-10-CM

## 2011-01-19 DIAGNOSIS — Z5112 Encounter for antineoplastic immunotherapy: Secondary | ICD-10-CM

## 2011-01-19 LAB — CBC WITH DIFFERENTIAL/PLATELET
Basophils Absolute: 0 10*3/uL (ref 0.0–0.1)
Eosinophils Absolute: 0.1 10*3/uL (ref 0.0–0.5)
HGB: 11.7 g/dL — ABNORMAL LOW (ref 13.0–17.1)
MCV: 98 fL (ref 79.3–98.0)
MONO#: 0.2 10*3/uL (ref 0.1–0.9)
NEUT#: 5.2 10*3/uL (ref 1.5–6.5)
RDW: 16.2 % — ABNORMAL HIGH (ref 11.0–14.6)
lymph#: 0.5 10*3/uL — ABNORMAL LOW (ref 0.9–3.3)

## 2011-01-19 LAB — BASIC METABOLIC PANEL
BUN: 22 mg/dL (ref 6–23)
CO2: 18 mEq/L — ABNORMAL LOW (ref 19–32)
Chloride: 107 mEq/L (ref 96–112)
Creatinine, Ser: 1 mg/dL (ref 0.40–1.50)
Potassium: 4.2 mEq/L (ref 3.5–5.3)

## 2011-01-23 ENCOUNTER — Other Ambulatory Visit: Payer: Self-pay | Admitting: Oncology

## 2011-01-23 ENCOUNTER — Encounter (HOSPITAL_BASED_OUTPATIENT_CLINIC_OR_DEPARTMENT_OTHER): Payer: PRIVATE HEALTH INSURANCE | Admitting: Oncology

## 2011-01-23 DIAGNOSIS — C9000 Multiple myeloma not having achieved remission: Secondary | ICD-10-CM

## 2011-01-23 DIAGNOSIS — Z7901 Long term (current) use of anticoagulants: Secondary | ICD-10-CM

## 2011-01-23 DIAGNOSIS — Z5112 Encounter for antineoplastic immunotherapy: Secondary | ICD-10-CM

## 2011-01-23 LAB — CBC WITH DIFFERENTIAL/PLATELET
Basophils Absolute: 0 10*3/uL (ref 0.0–0.1)
EOS%: 0.4 % (ref 0.0–7.0)
LYMPH%: 10 % — ABNORMAL LOW (ref 14.0–49.0)
MCH: 32.7 pg (ref 27.2–33.4)
MCV: 95.4 fL (ref 79.3–98.0)
MONO%: 3 % (ref 0.0–14.0)
Platelets: 190 10*3/uL (ref 140–400)
RBC: 3.49 10*6/uL — ABNORMAL LOW (ref 4.20–5.82)
RDW: 15.7 % — ABNORMAL HIGH (ref 11.0–14.6)

## 2011-01-24 ENCOUNTER — Other Ambulatory Visit: Payer: Self-pay | Admitting: Oncology

## 2011-01-26 ENCOUNTER — Other Ambulatory Visit: Payer: Self-pay | Admitting: Oncology

## 2011-01-26 ENCOUNTER — Encounter (HOSPITAL_BASED_OUTPATIENT_CLINIC_OR_DEPARTMENT_OTHER): Payer: PRIVATE HEALTH INSURANCE | Admitting: Oncology

## 2011-01-26 DIAGNOSIS — C9000 Multiple myeloma not having achieved remission: Secondary | ICD-10-CM

## 2011-01-26 LAB — CBC WITH DIFFERENTIAL/PLATELET
BASO%: 0.2 % (ref 0.0–2.0)
EOS%: 0 % (ref 0.0–7.0)
Eosinophils Absolute: 0 10*3/uL (ref 0.0–0.5)
LYMPH%: 10.9 % — ABNORMAL LOW (ref 14.0–49.0)
MCHC: 34.4 g/dL (ref 32.0–36.0)
MCV: 100.4 fL — ABNORMAL HIGH (ref 79.3–98.0)
MONO%: 0.8 % (ref 0.0–14.0)
NEUT#: 3.9 10*3/uL (ref 1.5–6.5)
Platelets: 165 10*3/uL (ref 140–400)
RBC: 3.62 10*6/uL — ABNORMAL LOW (ref 4.20–5.82)
RDW: 16.4 % — ABNORMAL HIGH (ref 11.0–14.6)

## 2011-01-26 LAB — PROTIME-INR
INR: 1.1 — ABNORMAL LOW (ref 2.00–3.50)
Protime: 13.2 Seconds (ref 10.6–13.4)

## 2011-01-30 LAB — COMPREHENSIVE METABOLIC PANEL
ALT: 34 U/L (ref 0–53)
Alkaline Phosphatase: 66 U/L (ref 39–117)
Creatinine, Ser: 0.95 mg/dL (ref 0.40–1.50)
Sodium: 134 mEq/L — ABNORMAL LOW (ref 135–145)
Total Bilirubin: 0.3 mg/dL (ref 0.3–1.2)
Total Protein: 6.4 g/dL (ref 6.0–8.3)

## 2011-01-30 LAB — PROTEIN ELECTROPHORESIS, SERUM
Albumin ELP: 58.1 % (ref 55.8–66.1)
Alpha-2-Globulin: 12.2 % — ABNORMAL HIGH (ref 7.1–11.8)
Beta 2: 4.5 % (ref 3.2–6.5)
Beta Globulin: 7.3 % — ABNORMAL HIGH (ref 4.7–7.2)

## 2011-01-30 LAB — KAPPA/LAMBDA LIGHT CHAINS: Lambda Free Lght Chn: 1.8 mg/dL (ref 0.57–2.63)

## 2011-02-01 ENCOUNTER — Other Ambulatory Visit: Payer: Self-pay | Admitting: Oncology

## 2011-02-01 ENCOUNTER — Other Ambulatory Visit: Payer: Self-pay | Admitting: Interventional Radiology

## 2011-02-01 ENCOUNTER — Ambulatory Visit (HOSPITAL_COMMUNITY): Payer: PRIVATE HEALTH INSURANCE | Attending: Oncology

## 2011-02-01 ENCOUNTER — Ambulatory Visit (HOSPITAL_COMMUNITY)
Admission: RE | Admit: 2011-02-01 | Discharge: 2011-02-01 | Disposition: A | Discharge status: Home / Self Care | Payer: PRIVATE HEALTH INSURANCE | Source: Ambulatory Visit | Attending: Oncology | Admitting: Oncology

## 2011-02-01 DIAGNOSIS — C9000 Multiple myeloma not having achieved remission: Secondary | ICD-10-CM | POA: Insufficient documentation

## 2011-02-01 DIAGNOSIS — D72822 Plasmacytosis: Secondary | ICD-10-CM | POA: Insufficient documentation

## 2011-02-01 LAB — CBC
Platelets: 131 10*3/uL — ABNORMAL LOW (ref 150–400)
RDW: 15.6 % — ABNORMAL HIGH (ref 11.5–15.5)
WBC: 3.9 10*3/uL — ABNORMAL LOW (ref 4.0–10.5)

## 2011-02-01 LAB — APTT: aPTT: 31 seconds (ref 24–37)

## 2011-02-01 LAB — PROTIME-INR: INR: 0.94 (ref 0.00–1.49)

## 2011-02-03 NOTE — Procedures (Signed)
Andre [name], Andre [name]              ACCOUNT NO.:  [id number]   MEDICAL RECORD NO.:  [id number]          PATIENT TYPE:  OUT   LOCATION:  SLEEP CENTER                 FACILITY:  [facility]   PHYSICIAN:  Clinton D. Maple Hudson, M.D. DATE OF BIRTH:  23-May-1951   DATE OF STUDY:  09/07/2004                              NOCTURNAL POLYSOMNOGRAM   INDICATION FOR STUDY:  Hypersomnia with sleep apnea.  Other sleep  disturbance.  Neck size 15.5 inches.  Epsworth sleepiness score 13/24.  BMI  29.  Weight 204 pounds.   SLEEP ARCHITECTURE:  Total sleep time 418 with sleep efficiency 90%.  Stage  1 was 11%, stage 2 was 63% and stages 3 and 4 were 10%.  REM was 17% of  total sleep time.  Sleep latency 3 minutes.  REM latency 295 minutes.  Awake  after sleep onset 42 minutes.  Arousal index 20.   RESPIRATORY DATA:  RDI 14.5 obstructive events per hour, indicating mild  obstructive sleep apnea/hypopnea syndrome.  This included 5 central apneas,  26 obstructive apneas and 70 hypopneas.  Events were not positional.  REM  RDI was 26.8 per hour.  He did not have enough early events to permit CPAP  titration by protocol.   OXYGEN DATA:  Moderate to occasionally loud snoring with oxygen desaturation  to a nadir of 85% with events.  Mean oxygen saturation was 93% on room air  through the study.   CARDIAC DATA:  Normal sinus rhythm.   MOVEMENT/PARASOMNIA:  A total of 86 limb jerks were recorded of which 12  were associated with arousal or awakening for a periodic limb movement with  arousal index of 1.7 per hour.   IMPRESSION/RECOMMENDATION:  Mild obstructive sleep apnea/hypopnea syndrome,  RDI 14.5 per hour with oxygen desaturation to 85% during events.  Very mild  periodic limb movement with arousal, 1.7 per hour.  Consider return for CPAP  titration or evaluation for alternative therapies as appropriate.                                                           Clinton D. Maple Hudson, M.D.  Diplomate, [employer]   CDY/MEDQ  D:  09/11/2004 13:46:25  T:  09/12/2004 16:02:09  Job:  [id number]

## 2011-02-08 ENCOUNTER — Other Ambulatory Visit: Payer: Self-pay | Admitting: Oncology

## 2011-02-08 ENCOUNTER — Encounter (HOSPITAL_BASED_OUTPATIENT_CLINIC_OR_DEPARTMENT_OTHER): Payer: PRIVATE HEALTH INSURANCE | Admitting: Oncology

## 2011-02-08 DIAGNOSIS — C9000 Multiple myeloma not having achieved remission: Secondary | ICD-10-CM

## 2011-02-08 DIAGNOSIS — Z5112 Encounter for antineoplastic immunotherapy: Secondary | ICD-10-CM

## 2011-02-08 LAB — COMPREHENSIVE METABOLIC PANEL
ALT: 24 U/L (ref 0–53)
AST: 27 U/L (ref 0–37)
Albumin: 3.5 g/dL (ref 3.5–5.2)
Alkaline Phosphatase: 66 U/L (ref 39–117)
Calcium: 8.8 mg/dL (ref 8.4–10.5)
Chloride: 106 mEq/L (ref 96–112)
Potassium: 4.7 mEq/L (ref 3.5–5.3)
Sodium: 139 mEq/L (ref 135–145)
Total Protein: 6.4 g/dL (ref 6.0–8.3)

## 2011-02-08 LAB — CBC WITH DIFFERENTIAL/PLATELET
Basophils Absolute: 0.1 10*3/uL (ref 0.0–0.1)
EOS%: 5.2 % (ref 0.0–7.0)
HGB: 11.1 g/dL — ABNORMAL LOW (ref 13.0–17.1)
MCH: 34.1 pg — ABNORMAL HIGH (ref 27.2–33.4)
MCV: 99.9 fL — ABNORMAL HIGH (ref 79.3–98.0)
MONO%: 12.2 % (ref 0.0–14.0)
RDW: 16.4 % — ABNORMAL HIGH (ref 11.0–14.6)

## 2011-02-14 ENCOUNTER — Other Ambulatory Visit: Payer: Self-pay | Admitting: Cardiology

## 2011-02-14 MED ORDER — EZETIMIBE 10 MG PO TABS: 10.0000 mg | ORAL_TABLET | Freq: Every day | ORAL | Status: DC

## 2011-02-14 NOTE — Telephone Encounter (Signed)
Med refill

## 2011-02-15 ENCOUNTER — Encounter: Payer: Self-pay | Admitting: Cardiology

## 2011-02-16 ENCOUNTER — Other Ambulatory Visit: Payer: Self-pay | Admitting: *Deleted

## 2011-02-16 ENCOUNTER — Ambulatory Visit: Payer: PRIVATE HEALTH INSURANCE | Admitting: Cardiology

## 2011-02-16 ENCOUNTER — Encounter: Payer: PRIVATE HEALTH INSURANCE | Admitting: *Deleted

## 2011-02-16 DIAGNOSIS — E7800 Pure hypercholesterolemia, unspecified: Secondary | ICD-10-CM

## 2011-02-16 MED ORDER — EZETIMIBE 10 MG PO TABS: 10.0000 mg | ORAL_TABLET | Freq: Every day | ORAL | Status: DC

## 2011-02-16 NOTE — Telephone Encounter (Signed)
Refilled zetia.

## 2011-02-27 ENCOUNTER — Other Ambulatory Visit: Payer: Self-pay | Admitting: Physician Assistant

## 2011-02-27 ENCOUNTER — Encounter (HOSPITAL_BASED_OUTPATIENT_CLINIC_OR_DEPARTMENT_OTHER): Payer: PRIVATE HEALTH INSURANCE | Admitting: Oncology

## 2011-02-27 DIAGNOSIS — IMO0002 Reserved for concepts with insufficient information to code with codable children: Secondary | ICD-10-CM

## 2011-02-27 DIAGNOSIS — M8448XA Pathological fracture, other site, initial encounter for fracture: Secondary | ICD-10-CM

## 2011-02-27 DIAGNOSIS — K5900 Constipation, unspecified: Secondary | ICD-10-CM

## 2011-02-27 DIAGNOSIS — C9000 Multiple myeloma not having achieved remission: Secondary | ICD-10-CM

## 2011-02-27 LAB — CBC WITH DIFFERENTIAL/PLATELET
BASO%: 0 % (ref 0.0–2.0)
Basophils Absolute: 0 10*3/uL (ref 0.0–0.1)
EOS%: 2.1 % (ref 0.0–7.0)
HCT: 36.9 % — ABNORMAL LOW (ref 38.4–49.9)
HGB: 12.5 g/dL — ABNORMAL LOW (ref 13.0–17.1)
MCH: 33.8 pg — ABNORMAL HIGH (ref 27.2–33.4)
MCHC: 33.8 g/dL (ref 32.0–36.0)
MCV: 100 fL — ABNORMAL HIGH (ref 79.3–98.0)
MONO%: 3.9 % (ref 0.0–14.0)
NEUT%: 88.7 % — ABNORMAL HIGH (ref 39.0–75.0)
RDW: 16 % — ABNORMAL HIGH (ref 11.0–14.6)
lymph#: 0.4 10*3/uL — ABNORMAL LOW (ref 0.9–3.3)

## 2011-02-27 LAB — COMPREHENSIVE METABOLIC PANEL
ALT: 29 U/L (ref 0–53)
AST: 31 U/L (ref 0–37)
Alkaline Phosphatase: 71 U/L (ref 39–117)
BUN: 18 mg/dL (ref 6–23)
Calcium: 10.3 mg/dL (ref 8.4–10.5)
Chloride: 102 mEq/L (ref 96–112)
Creatinine, Ser: 1.01 mg/dL (ref 0.50–1.35)

## 2011-03-01 ENCOUNTER — Other Ambulatory Visit: Payer: Self-pay | Admitting: Physician Assistant

## 2011-03-01 ENCOUNTER — Encounter (HOSPITAL_BASED_OUTPATIENT_CLINIC_OR_DEPARTMENT_OTHER): Payer: PRIVATE HEALTH INSURANCE | Admitting: Oncology

## 2011-03-01 DIAGNOSIS — C9000 Multiple myeloma not having achieved remission: Secondary | ICD-10-CM

## 2011-03-01 DIAGNOSIS — M8448XA Pathological fracture, other site, initial encounter for fracture: Secondary | ICD-10-CM

## 2011-03-01 DIAGNOSIS — K5900 Constipation, unspecified: Secondary | ICD-10-CM

## 2011-03-01 DIAGNOSIS — IMO0002 Reserved for concepts with insufficient information to code with codable children: Secondary | ICD-10-CM

## 2011-03-01 LAB — COMPREHENSIVE METABOLIC PANEL
ALT: 19 U/L (ref 0–53)
AST: 19 U/L (ref 0–37)
Alkaline Phosphatase: 70 U/L (ref 39–117)
BUN: 18 mg/dL (ref 6–23)
Calcium: 9.5 mg/dL (ref 8.4–10.5)
Chloride: 104 mEq/L (ref 96–112)
Creatinine, Ser: 0.98 mg/dL (ref 0.50–1.35)
Total Bilirubin: 0.6 mg/dL (ref 0.3–1.2)

## 2011-03-01 LAB — CBC WITH DIFFERENTIAL/PLATELET
BASO%: 0.2 % (ref 0.0–2.0)
Basophils Absolute: 0 10*3/uL (ref 0.0–0.1)
EOS%: 3 % (ref 0.0–7.0)
HCT: 33 % — ABNORMAL LOW (ref 38.4–49.9)
HGB: 11.2 g/dL — ABNORMAL LOW (ref 13.0–17.1)
LYMPH%: 8.7 % — ABNORMAL LOW (ref 14.0–49.0)
MCH: 33.5 pg — ABNORMAL HIGH (ref 27.2–33.4)
MCHC: 33.9 g/dL (ref 32.0–36.0)
MCV: 98.8 fL — ABNORMAL HIGH (ref 79.3–98.0)
MONO%: 4.4 % (ref 0.0–14.0)
NEUT%: 83.7 % — ABNORMAL HIGH (ref 39.0–75.0)
Platelets: 91 10*3/uL — ABNORMAL LOW (ref 140–400)

## 2011-03-03 ENCOUNTER — Encounter (HOSPITAL_BASED_OUTPATIENT_CLINIC_OR_DEPARTMENT_OTHER): Payer: PRIVATE HEALTH INSURANCE | Admitting: Oncology

## 2011-03-03 ENCOUNTER — Other Ambulatory Visit: Payer: Self-pay | Admitting: Physician Assistant

## 2011-03-03 DIAGNOSIS — M8448XA Pathological fracture, other site, initial encounter for fracture: Secondary | ICD-10-CM

## 2011-03-03 DIAGNOSIS — K5900 Constipation, unspecified: Secondary | ICD-10-CM

## 2011-03-03 DIAGNOSIS — IMO0002 Reserved for concepts with insufficient information to code with codable children: Secondary | ICD-10-CM

## 2011-03-03 DIAGNOSIS — C9000 Multiple myeloma not having achieved remission: Secondary | ICD-10-CM

## 2011-03-03 LAB — CBC WITH DIFFERENTIAL/PLATELET
Basophils Absolute: 0 10*3/uL (ref 0.0–0.1)
EOS%: 8.9 % — ABNORMAL HIGH (ref 0.0–7.0)
HCT: 31.5 % — ABNORMAL LOW (ref 38.4–49.9)
HGB: 10.8 g/dL — ABNORMAL LOW (ref 13.0–17.1)
LYMPH%: 46.7 % (ref 14.0–49.0)
MCH: 32.5 pg (ref 27.2–33.4)
NEUT%: 6.6 % — ABNORMAL LOW (ref 39.0–75.0)
Platelets: 86 10*3/uL — ABNORMAL LOW (ref 140–400)
lymph#: 0.4 10*3/uL — ABNORMAL LOW (ref 0.9–3.3)

## 2011-03-03 LAB — COMPREHENSIVE METABOLIC PANEL
Albumin: 4.1 g/dL (ref 3.5–5.2)
BUN: 12 mg/dL (ref 6–23)
Calcium: 9 mg/dL (ref 8.4–10.5)
Chloride: 101 mEq/L (ref 96–112)
Creatinine, Ser: 0.86 mg/dL (ref 0.50–1.35)
Glucose, Bld: 100 mg/dL — ABNORMAL HIGH (ref 70–99)
Potassium: 4.4 mEq/L (ref 3.5–5.3)

## 2011-03-15 ENCOUNTER — Encounter: Payer: Self-pay | Admitting: Otolaryngology

## 2011-03-15 ENCOUNTER — Encounter: Payer: Self-pay | Admitting: Cardiology

## 2011-04-03 ENCOUNTER — Other Ambulatory Visit: Payer: Self-pay | Admitting: Oncology

## 2011-04-03 ENCOUNTER — Encounter (HOSPITAL_BASED_OUTPATIENT_CLINIC_OR_DEPARTMENT_OTHER): Payer: PRIVATE HEALTH INSURANCE | Admitting: Oncology

## 2011-04-03 DIAGNOSIS — K5900 Constipation, unspecified: Secondary | ICD-10-CM

## 2011-04-03 DIAGNOSIS — C9000 Multiple myeloma not having achieved remission: Secondary | ICD-10-CM

## 2011-04-03 DIAGNOSIS — M8448XA Pathological fracture, other site, initial encounter for fracture: Secondary | ICD-10-CM

## 2011-04-03 DIAGNOSIS — IMO0002 Reserved for concepts with insufficient information to code with codable children: Secondary | ICD-10-CM

## 2011-04-03 LAB — COMPREHENSIVE METABOLIC PANEL
ALT: 21 U/L (ref 0–53)
AST: 25 U/L (ref 0–37)
Albumin: 4.1 g/dL (ref 3.5–5.2)
Alkaline Phosphatase: 85 U/L (ref 39–117)
BUN: 18 mg/dL (ref 6–23)
CO2: 23 mEq/L (ref 19–32)
Calcium: 9.7 mg/dL (ref 8.4–10.5)
Chloride: 104 mEq/L (ref 96–112)
Creatinine, Ser: 0.92 mg/dL (ref 0.50–1.35)
Glucose, Bld: 89 mg/dL (ref 70–99)
Potassium: 4.2 mEq/L (ref 3.5–5.3)
Sodium: 139 mEq/L (ref 135–145)
Total Bilirubin: 0.3 mg/dL (ref 0.3–1.2)
Total Protein: 6.5 g/dL (ref 6.0–8.3)

## 2011-04-03 LAB — CBC WITH DIFFERENTIAL/PLATELET
BASO%: 0.3 % (ref 0.0–2.0)
Basophils Absolute: 0 10*3/uL (ref 0.0–0.1)
EOS%: 0 % (ref 0.0–7.0)
Eosinophils Absolute: 0 10*3/uL (ref 0.0–0.5)
HCT: 31.6 % — ABNORMAL LOW (ref 38.4–49.9)
HGB: 10.7 g/dL — ABNORMAL LOW (ref 13.0–17.1)
LYMPH%: 28.5 % (ref 14.0–49.0)
MCH: 31.9 pg (ref 27.2–33.4)
MCHC: 33.9 g/dL (ref 32.0–36.0)
MCV: 94.3 fL (ref 79.3–98.0)
MONO#: 1.5 10*3/uL — ABNORMAL HIGH (ref 0.1–0.9)
MONO%: 20.6 % — ABNORMAL HIGH (ref 0.0–14.0)
NEUT#: 3.6 10*3/uL (ref 1.5–6.5)
NEUT%: 50.6 % (ref 39.0–75.0)
Platelets: 169 10*3/uL (ref 140–400)
RBC: 3.35 10*6/uL — ABNORMAL LOW (ref 4.20–5.82)
RDW: 16.6 % — ABNORMAL HIGH (ref 11.0–14.6)
WBC: 7.1 10*3/uL (ref 4.0–10.3)
lymph#: 2 10*3/uL (ref 0.9–3.3)
nRBC: 1 % — ABNORMAL HIGH (ref 0–0)

## 2011-04-10 ENCOUNTER — Encounter (HOSPITAL_BASED_OUTPATIENT_CLINIC_OR_DEPARTMENT_OTHER): Payer: PRIVATE HEALTH INSURANCE | Admitting: Oncology

## 2011-04-10 ENCOUNTER — Other Ambulatory Visit: Payer: Self-pay | Admitting: Oncology

## 2011-04-10 DIAGNOSIS — IMO0002 Reserved for concepts with insufficient information to code with codable children: Secondary | ICD-10-CM

## 2011-04-10 DIAGNOSIS — K5900 Constipation, unspecified: Secondary | ICD-10-CM

## 2011-04-10 DIAGNOSIS — M8448XA Pathological fracture, other site, initial encounter for fracture: Secondary | ICD-10-CM

## 2011-04-10 DIAGNOSIS — C9000 Multiple myeloma not having achieved remission: Secondary | ICD-10-CM

## 2011-04-10 LAB — CBC WITH DIFFERENTIAL/PLATELET
Basophils Absolute: 0 10*3/uL (ref 0.0–0.1)
Eosinophils Absolute: 0.2 10*3/uL (ref 0.0–0.5)
HCT: 33.7 % — ABNORMAL LOW (ref 38.4–49.9)
HGB: 11.5 g/dL — ABNORMAL LOW (ref 13.0–17.1)
LYMPH%: 28.9 % (ref 14.0–49.0)
MCV: 98.8 fL — ABNORMAL HIGH (ref 79.3–98.0)
MONO%: 16.2 % — ABNORMAL HIGH (ref 0.0–14.0)
NEUT#: 2.9 10*3/uL (ref 1.5–6.5)
NEUT%: 50.8 % (ref 39.0–75.0)
Platelets: 238 10*3/uL (ref 140–400)

## 2011-04-12 LAB — COMPREHENSIVE METABOLIC PANEL
ALT: 24 U/L (ref 0–53)
CO2: 25 mEq/L (ref 19–32)
Calcium: 9.7 mg/dL (ref 8.4–10.5)
Chloride: 102 mEq/L (ref 96–112)
Glucose, Bld: 113 mg/dL — ABNORMAL HIGH (ref 70–99)
Sodium: 138 mEq/L (ref 135–145)
Total Protein: 6.3 g/dL (ref 6.0–8.3)

## 2011-04-12 LAB — SPEP & IFE WITH QIG
Albumin ELP: 60 % (ref 55.8–66.1)
Alpha-1-Globulin: 4.8 % (ref 2.9–4.9)
Alpha-2-Globulin: 11 % (ref 7.1–11.8)
Beta 2: 4.5 % (ref 3.2–6.5)
IgM, Serum: 26 mg/dL — ABNORMAL LOW (ref 41–251)

## 2011-04-12 LAB — MAGNESIUM: Magnesium: 2 mg/dL (ref 1.5–2.5)

## 2011-04-17 ENCOUNTER — Other Ambulatory Visit: Payer: Self-pay | Admitting: Oncology

## 2011-04-17 ENCOUNTER — Encounter (HOSPITAL_BASED_OUTPATIENT_CLINIC_OR_DEPARTMENT_OTHER): Payer: PRIVATE HEALTH INSURANCE | Admitting: Oncology

## 2011-04-17 DIAGNOSIS — IMO0002 Reserved for concepts with insufficient information to code with codable children: Secondary | ICD-10-CM

## 2011-04-17 DIAGNOSIS — M8448XA Pathological fracture, other site, initial encounter for fracture: Secondary | ICD-10-CM

## 2011-04-17 DIAGNOSIS — K5900 Constipation, unspecified: Secondary | ICD-10-CM

## 2011-04-17 DIAGNOSIS — C9000 Multiple myeloma not having achieved remission: Secondary | ICD-10-CM

## 2011-04-17 LAB — COMPREHENSIVE METABOLIC PANEL
ALT: 24 U/L (ref 0–53)
AST: 35 U/L (ref 0–37)
Calcium: 10.1 mg/dL (ref 8.4–10.5)
Chloride: 106 mEq/L (ref 96–112)
Creatinine, Ser: 0.94 mg/dL (ref 0.50–1.35)
Potassium: 4.9 mEq/L (ref 3.5–5.3)
Sodium: 140 mEq/L (ref 135–145)

## 2011-04-17 LAB — CBC WITH DIFFERENTIAL/PLATELET
BASO%: 0.3 % (ref 0.0–2.0)
EOS%: 6.5 % (ref 0.0–7.0)
MCH: 33.4 pg (ref 27.2–33.4)
MCHC: 33.3 g/dL (ref 32.0–36.0)
RBC: 3.66 10*6/uL — ABNORMAL LOW (ref 4.20–5.82)
RDW: 18.9 % — ABNORMAL HIGH (ref 11.0–14.6)
lymph#: 1.5 10*3/uL (ref 0.9–3.3)

## 2011-05-23 ENCOUNTER — Other Ambulatory Visit: Payer: Self-pay | Admitting: Oncology

## 2011-05-23 ENCOUNTER — Encounter (HOSPITAL_BASED_OUTPATIENT_CLINIC_OR_DEPARTMENT_OTHER): Payer: PRIVATE HEALTH INSURANCE | Admitting: Oncology

## 2011-05-23 DIAGNOSIS — C9000 Multiple myeloma not having achieved remission: Secondary | ICD-10-CM

## 2011-05-23 DIAGNOSIS — K5900 Constipation, unspecified: Secondary | ICD-10-CM

## 2011-05-23 DIAGNOSIS — IMO0002 Reserved for concepts with insufficient information to code with codable children: Secondary | ICD-10-CM

## 2011-05-23 DIAGNOSIS — M8448XA Pathological fracture, other site, initial encounter for fracture: Secondary | ICD-10-CM

## 2011-05-23 LAB — CBC WITH DIFFERENTIAL/PLATELET
Basophils Absolute: 0 10*3/uL (ref 0.0–0.1)
Eosinophils Absolute: 0.2 10*3/uL (ref 0.0–0.5)
HCT: 39.8 % (ref 38.4–49.9)
HGB: 13.6 g/dL (ref 13.0–17.1)
LYMPH%: 12.6 % — ABNORMAL LOW (ref 14.0–49.0)
MCV: 95.9 fL (ref 79.3–98.0)
MONO#: 0.5 10*3/uL (ref 0.1–0.9)
MONO%: 9.6 % (ref 0.0–14.0)
NEUT#: 3.8 10*3/uL (ref 1.5–6.5)
Platelets: 155 10*3/uL (ref 140–400)
RBC: 4.15 10*6/uL — ABNORMAL LOW (ref 4.20–5.82)
WBC: 5.1 10*3/uL (ref 4.0–10.3)

## 2011-05-25 LAB — COMPREHENSIVE METABOLIC PANEL
Albumin: 4 g/dL (ref 3.5–5.2)
Alkaline Phosphatase: 74 U/L (ref 39–117)
BUN: 17 mg/dL (ref 6–23)
CO2: 22 mEq/L (ref 19–32)
Glucose, Bld: 94 mg/dL (ref 70–99)
Potassium: 4.2 mEq/L (ref 3.5–5.3)
Sodium: 138 mEq/L (ref 135–145)
Total Bilirubin: 0.5 mg/dL (ref 0.3–1.2)
Total Protein: 6.4 g/dL (ref 6.0–8.3)

## 2011-05-25 LAB — PROTEIN ELECTROPHORESIS, SERUM
Albumin ELP: 56.4 % (ref 55.8–66.1)
Alpha-2-Globulin: 13.1 % — ABNORMAL HIGH (ref 7.1–11.8)
Beta 2: 4.9 % (ref 3.2–6.5)
Beta Globulin: 7.5 % — ABNORMAL HIGH (ref 4.7–7.2)

## 2011-05-25 LAB — KAPPA/LAMBDA LIGHT CHAINS: Kappa:Lambda Ratio: 1.04 (ref 0.26–1.65)

## 2011-06-27 ENCOUNTER — Encounter: Payer: Self-pay | Admitting: Certified Registered Nurse Anesthetist

## 2011-06-28 ENCOUNTER — Encounter: Payer: Self-pay | Admitting: *Deleted

## 2011-07-04 ENCOUNTER — Encounter: Payer: Self-pay | Admitting: Oncology

## 2011-07-04 ENCOUNTER — Other Ambulatory Visit: Payer: Self-pay | Admitting: Oncology

## 2011-07-04 DIAGNOSIS — C9000 Multiple myeloma not having achieved remission: Secondary | ICD-10-CM | POA: Insufficient documentation

## 2011-07-18 ENCOUNTER — Encounter: Payer: Self-pay | Admitting: *Deleted

## 2011-07-20 ENCOUNTER — Encounter (HOSPITAL_BASED_OUTPATIENT_CLINIC_OR_DEPARTMENT_OTHER): Payer: PRIVATE HEALTH INSURANCE | Admitting: Oncology

## 2011-07-20 ENCOUNTER — Other Ambulatory Visit: Payer: Self-pay | Admitting: Oncology

## 2011-07-20 DIAGNOSIS — C9000 Multiple myeloma not having achieved remission: Secondary | ICD-10-CM

## 2011-07-20 DIAGNOSIS — M8448XA Pathological fracture, other site, initial encounter for fracture: Secondary | ICD-10-CM

## 2011-07-20 DIAGNOSIS — K5900 Constipation, unspecified: Secondary | ICD-10-CM

## 2011-07-20 DIAGNOSIS — IMO0002 Reserved for concepts with insufficient information to code with codable children: Secondary | ICD-10-CM

## 2011-07-20 LAB — CBC WITH DIFFERENTIAL/PLATELET
BASO%: 0.3 % (ref 0.0–2.0)
EOS%: 3.1 % (ref 0.0–7.0)
HCT: 39.3 % (ref 38.4–49.9)
MCH: 31.7 pg (ref 27.2–33.4)
MCHC: 33.8 g/dL (ref 32.0–36.0)
MONO#: 0.7 10*3/uL (ref 0.1–0.9)
RBC: 4.18 10*6/uL — ABNORMAL LOW (ref 4.20–5.82)
RDW: 16.6 % — ABNORMAL HIGH (ref 11.0–14.6)
WBC: 5.8 10*3/uL (ref 4.0–10.3)
lymph#: 1.1 10*3/uL (ref 0.9–3.3)

## 2011-07-21 LAB — PROTEIN / CREATININE RATIO, URINE
Creatinine, Urine: 31.5 mg/dL
Total Protein, Urine: <3 mg/dL

## 2011-07-24 ENCOUNTER — Ambulatory Visit (HOSPITAL_BASED_OUTPATIENT_CLINIC_OR_DEPARTMENT_OTHER): Payer: PRIVATE HEALTH INSURANCE | Admitting: Oncology

## 2011-07-24 DIAGNOSIS — C9000 Multiple myeloma not having achieved remission: Secondary | ICD-10-CM

## 2011-07-24 LAB — COMPREHENSIVE METABOLIC PANEL
ALT: 34 U/L (ref 0–53)
AST: 37 U/L (ref 0–37)
Alkaline Phosphatase: 83 U/L (ref 39–117)
CO2: 20 mEq/L (ref 19–32)
Creatinine, Ser: 1.04 mg/dL (ref 0.50–1.35)
Sodium: 141 mEq/L (ref 135–145)
Total Bilirubin: 0.3 mg/dL (ref 0.3–1.2)
Total Protein: 6.6 g/dL (ref 6.0–8.3)

## 2011-07-24 LAB — PROTEIN ELECTROPHORESIS, SERUM
Alpha-2-Globulin: 10.8 % (ref 7.1–11.8)
Beta 2: 3.4 % (ref 3.2–6.5)
Beta Globulin: 6.9 % (ref 4.7–7.2)
Gamma Globulin: 11.8 % (ref 11.1–18.8)
M-Spike, %: 0.24 g/dL
Total Protein, Serum Electrophoresis: 6.6 g/dL (ref 6.0–8.3)

## 2011-07-24 NOTE — Progress Notes (Signed)
ID: Andre [name]   Chief Complaint  Patient presents with  . Multiple Myeloma    60 y/o [place] man with a history or IgG lambda multiple myeloma    Interval History: Pt fells "great"--skin a bit dry--no pain, rash, fever, exercising regulalry by walking, swimming, biking and doing yoga (no jogging).   ROS:  Detailed ROS otherwise negative  Medications: I have reviewed the patient's current medications.    Objective: Vital signs in last 24 hours:  Blood pressure 135/84, pulse 70, temperature 97.2 F (36.2 C), temperature source Oral, height 5\' 10"  (1.778 m), weight 178 lb 8 oz (80.967 kg).   Physical Exam:   Sclerae unicteric  Oropharynx clear  No peripheral adenopathy  Lungs clear -- no rales or rhonchi  Heart regular rate and rhythm  Abdomen benign  MSK no focal spinal tenderness, no peripheral edema  Neuro nonfocal  Breast exam:   Lab Results:  All labs WNL, M-spike 0.28   Studies/Results: No results found.  Assessment:  Stable to slightly improved post transplant   Plan: Initiate monthly zolendronic acide, repeat labs monthly, consider starting revlimid when M-pike trends up Candi Profit C 07/24/2011

## 2011-08-06 ENCOUNTER — Other Ambulatory Visit: Payer: Self-pay | Admitting: Oncology

## 2011-08-07 ENCOUNTER — Ambulatory Visit (HOSPITAL_BASED_OUTPATIENT_CLINIC_OR_DEPARTMENT_OTHER): Payer: PRIVATE HEALTH INSURANCE

## 2011-08-07 ENCOUNTER — Other Ambulatory Visit: Payer: Self-pay | Admitting: Oncology

## 2011-08-07 DIAGNOSIS — C9000 Multiple myeloma not having achieved remission: Secondary | ICD-10-CM

## 2011-08-07 MED ORDER — ZOLEDRONIC ACID 4 MG/100ML IV SOLN
4.0000 mg | Freq: Once | INTRAVENOUS | Status: AC
  Administered 2011-08-07: 4 mg via INTRAVENOUS
  Filled 2011-08-07: qty 100

## 2011-08-07 MED ORDER — SODIUM CHLORIDE 0.9 % IJ SOLN
3.0000 mL | Freq: Once | INTRAMUSCULAR | Status: DC | PRN
  Filled 2011-08-07: qty 10

## 2011-08-07 MED ORDER — SODIUM CHLORIDE 0.9 % IV SOLN
Freq: Once | INTRAVENOUS | Status: AC
  Administered 2011-08-07: 10:00:00 via INTRAVENOUS

## 2011-08-07 NOTE — Patient Instructions (Signed)
Instructed patient to call with any problems.  Patient aware of next appointment 

## 2011-08-22 ENCOUNTER — Other Ambulatory Visit: Payer: Self-pay | Admitting: Oncology

## 2011-08-22 ENCOUNTER — Other Ambulatory Visit (HOSPITAL_BASED_OUTPATIENT_CLINIC_OR_DEPARTMENT_OTHER): Payer: PRIVATE HEALTH INSURANCE | Admitting: Lab

## 2011-08-22 DIAGNOSIS — M8448XA Pathological fracture, other site, initial encounter for fracture: Secondary | ICD-10-CM

## 2011-08-22 DIAGNOSIS — C9000 Multiple myeloma not having achieved remission: Secondary | ICD-10-CM

## 2011-08-22 LAB — CBC WITH DIFFERENTIAL/PLATELET
BASO%: 0.3 % (ref 0.0–2.0)
EOS%: 1.8 % (ref 0.0–7.0)
MCH: 31.7 pg (ref 27.2–33.4)
MCHC: 33.9 g/dL (ref 32.0–36.0)
MCV: 93.6 fL (ref 79.3–98.0)
MONO%: 10.8 % (ref 0.0–14.0)
NEUT#: 4.4 10*3/uL (ref 1.5–6.5)
RBC: 3.88 10*6/uL — ABNORMAL LOW (ref 4.20–5.82)
RDW: 17.8 % — ABNORMAL HIGH (ref 11.0–14.6)

## 2011-08-22 LAB — PROTEIN / CREATININE RATIO, URINE
Creatinine, Urine: 106.2 mg/dL
Total Protein, Urine: 7 mg/dL

## 2011-08-23 ENCOUNTER — Ambulatory Visit: Payer: PRIVATE HEALTH INSURANCE

## 2011-08-23 NOTE — Progress Notes (Signed)
Explained to pt that he is not due for zometa today. Trey Paula is going to talk to pt re scheduling

## 2011-08-24 LAB — COMPREHENSIVE METABOLIC PANEL
ALT: 29 U/L (ref 0–53)
AST: 33 U/L (ref 0–37)
Albumin: 4 g/dL (ref 3.5–5.2)
CO2: 22 mEq/L (ref 19–32)
Calcium: 9 mg/dL (ref 8.4–10.5)
Chloride: 105 mEq/L (ref 96–112)
Creatinine, Ser: 1.02 mg/dL (ref 0.50–1.35)
Potassium: 4.2 mEq/L (ref 3.5–5.3)
Total Protein: 6.1 g/dL (ref 6.0–8.3)

## 2011-08-24 LAB — PROTEIN ELECTROPHORESIS, SERUM
Alpha-2-Globulin: 10.4 % (ref 7.1–11.8)
Beta 2: 2.9 % — ABNORMAL LOW (ref 3.2–6.5)
Beta Globulin: 6.7 % (ref 4.7–7.2)
Gamma Globulin: 12.7 % (ref 11.1–18.8)
M-Spike, %: 0.27 g/dL
Total Protein, Serum Electrophoresis: 6.1 g/dL (ref 6.0–8.3)

## 2011-08-24 LAB — KAPPA/LAMBDA LIGHT CHAINS: Kappa free light chain: 1.79 mg/dL (ref 0.33–1.94)

## 2011-09-04 ENCOUNTER — Other Ambulatory Visit (HOSPITAL_BASED_OUTPATIENT_CLINIC_OR_DEPARTMENT_OTHER): Payer: PRIVATE HEALTH INSURANCE | Admitting: Lab

## 2011-09-04 ENCOUNTER — Other Ambulatory Visit: Payer: Self-pay | Admitting: Oncology

## 2011-09-04 ENCOUNTER — Ambulatory Visit (HOSPITAL_BASED_OUTPATIENT_CLINIC_OR_DEPARTMENT_OTHER): Payer: PRIVATE HEALTH INSURANCE

## 2011-09-04 DIAGNOSIS — C9000 Multiple myeloma not having achieved remission: Secondary | ICD-10-CM

## 2011-09-04 LAB — CBC WITH DIFFERENTIAL/PLATELET
EOS%: 2.5 % (ref 0.0–7.0)
Eosinophils Absolute: 0.1 10*3/uL (ref 0.0–0.5)
LYMPH%: 23.6 % (ref 14.0–49.0)
MCH: 30.8 pg (ref 27.2–33.4)
MCHC: 33.8 g/dL (ref 32.0–36.0)
MCV: 91.1 fL (ref 79.3–98.0)
MONO%: 12.1 % (ref 0.0–14.0)
NEUT#: 3.2 10*3/uL (ref 1.5–6.5)
Platelets: 152 10*3/uL (ref 140–400)
RBC: 4.06 10*6/uL — ABNORMAL LOW (ref 4.20–5.82)
RDW: 17.1 % — ABNORMAL HIGH (ref 11.0–14.6)

## 2011-09-04 LAB — PROTEIN / CREATININE RATIO, URINE: Creatinine, Urine: 26 mg/dL

## 2011-09-04 MED ORDER — SODIUM CHLORIDE 0.9 % IV SOLN
Freq: Once | INTRAVENOUS | Status: AC
  Administered 2011-09-04: 20 mL/h via INTRAVENOUS

## 2011-09-04 MED ORDER — ZOLEDRONIC ACID 4 MG/100ML IV SOLN
4.0000 mg | Freq: Once | INTRAVENOUS | Status: AC
  Administered 2011-09-04: 4 mg via INTRAVENOUS
  Filled 2011-09-04: qty 100

## 2011-09-04 MED ORDER — SODIUM CHLORIDE 0.9 % IJ SOLN
10.0000 mL | INTRAMUSCULAR | Status: DC | PRN
  Filled 2011-09-04: qty 10

## 2011-09-06 LAB — COMPREHENSIVE METABOLIC PANEL
Albumin: 4.1 g/dL (ref 3.5–5.2)
Alkaline Phosphatase: 74 U/L (ref 39–117)
BUN: 16 mg/dL (ref 6–23)
CO2: 22 mEq/L (ref 19–32)
Calcium: 9.3 mg/dL (ref 8.4–10.5)
Glucose, Bld: 88 mg/dL (ref 70–99)
Potassium: 4.6 mEq/L (ref 3.5–5.3)
Sodium: 141 mEq/L (ref 135–145)
Total Protein: 6.4 g/dL (ref 6.0–8.3)

## 2011-09-06 LAB — KAPPA/LAMBDA LIGHT CHAINS
Kappa free light chain: 1.65 mg/dL (ref 0.33–1.94)
Kappa:Lambda Ratio: 0.95 (ref 0.26–1.65)
Lambda Free Lght Chn: 1.74 mg/dL (ref 0.57–2.63)

## 2011-09-06 LAB — PROTEIN ELECTROPHORESIS, SERUM
Alpha-1-Globulin: 4.2 % (ref 2.9–4.9)
Gamma Globulin: 12.6 % (ref 11.1–18.8)
M-Spike, %: 0.29 g/dL
Total Protein, Serum Electrophoresis: 6.4 g/dL (ref 6.0–8.3)

## 2011-09-26 ENCOUNTER — Ambulatory Visit (HOSPITAL_BASED_OUTPATIENT_CLINIC_OR_DEPARTMENT_OTHER): Payer: PRIVATE HEALTH INSURANCE | Admitting: Oncology

## 2011-09-26 ENCOUNTER — Telehealth: Payer: Self-pay | Admitting: Oncology

## 2011-09-26 ENCOUNTER — Other Ambulatory Visit: Payer: Self-pay | Admitting: Oncology

## 2011-09-26 ENCOUNTER — Other Ambulatory Visit: Payer: PRIVATE HEALTH INSURANCE | Admitting: Lab

## 2011-09-26 DIAGNOSIS — C9000 Multiple myeloma not having achieved remission: Secondary | ICD-10-CM

## 2011-09-26 NOTE — Telephone Encounter (Signed)
Gv pt appt for [id number]

## 2011-09-26 NOTE — Progress Notes (Signed)
ID: Andre [name]  MR#: [id number]  CSN#: [id number]   Interval History:  Andre [name] returns today with one of his daughters for followup of his multiple myeloma. The only active treatment he is receiving right now is: Chronic acid. He received this most recently 09/04/2011. He tells me he feels "like the flu" for 2 or 3 days after that treatment. He has no other symptoms. His teeth have been fine and he goes to the dentist every 6 months. There are no plans for any extractions or implants. He has a return visit at [facility] for February 13. He has become a vegetarian, eating aches and fish however. He has a new copy, [name], who is in mixture of core D. and [place].  ROS:  He is running 30 minutes a couple of times a week and swimming 3 times a week. On weekends he runs a little bit more. He is fairly bored at work he says because he had to give so many duties up. 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 He does say that he has "chemotherapy brain", forgetting particularly names of people whose names she really should know. A detailed review of systems is otherwise unremarkable   Allergies  Allergen Reactions  . Crestor (Rosuvastatin Calcium)   . Lipitor (Atorvastatin Calcium)     Current Outpatient Prescriptions  Medication Sig Dispense Refill  . aspirin 81 MG tablet Take 81 mg by mouth daily.        . Cetirizine HCl (ZYRTEC PO) Take by mouth daily.        [name] ezetimibe (ZETIA) 10 MG tablet Take 1 tablet (10 mg total) by mouth daily.  90 tablet  3  . famciclovir (FAMVIR) 250 MG tablet Take 500 mg by mouth.        . fish oil-omega-3 fatty acids 1000 MG capsule Take by mouth daily.       . IRON PO Take by mouth daily.        [name] omeprazole (PRILOSEC) 40 MG capsule Take 40 mg by mouth daily.        [name] triamcinolone (KENALOG) 0.1 % cream Apply 1 application topically 2 (two) times daily.        [name] 5-Hydroxytryptophan (5-HTP PO) Take by mouth  daily.        [name] acyclovir (ZOVIRAX) 800 MG tablet Take 800 mg by mouth 2 (two) times daily.        . fexofenadine (ALLEGRA) 60 MG tablet Take 180 mg by mouth daily.        . fluticasone (FLOVENT HFA) 110 MCG/ACT inhaler Inhale 1 puff into the lungs 2 (two) times daily. fLOVENT 1 PUFF QID.       [name] SELENIUM PO Take by mouth daily.        . Vitamin D, Ergocalciferol, (DRISDOL) 50000 UNITS CAPS Take 50,000 Units by mouth every 7 (seven) days.           Objective:  Filed Vitals:   09/26/11 1132  BP: 131/80  Pulse: 68  Temp: 97.6 F (36.4 [name])    BMI: Body mass index is 24.79 kg/(m^2).   ECOG FS:  Physical Exam:   Sclerae unicteric  Oropharynx clear  No peripheral adenopathy  Lungs clear -- no rales or rhonchi  Heart regular rate and rhythm  Abdomen benign  MSK no focal spinal tenderness, no peripheral edema  Neuro nonfocal   Lab Results:  Chemistry      Component Value Date/Time   NA 141 09/04/2011 1012   NA 141 09/04/2011 1012   NA 141 09/04/2011 1012   K 4.6 09/04/2011 1012   K 4.6 09/04/2011 1012   K 4.6 09/04/2011 1012   CL 108 09/04/2011 1012   CL 108 09/04/2011 1012   CL 108 09/04/2011 1012   CO2 22 09/04/2011 1012   CO2 22 09/04/2011 1012   CO2 22 09/04/2011 1012   BUN 16 09/04/2011 1012   BUN 16 09/04/2011 1012   BUN 16 09/04/2011 1012   CREATININE 0.94 09/04/2011 1012   CREATININE 0.94 09/04/2011 1012   CREATININE 0.94 09/04/2011 1012      Component Value Date/Time   CALCIUM 9.3 09/04/2011 1012   CALCIUM 9.3 09/04/2011 1012   CALCIUM 9.3 09/04/2011 1012   ALKPHOS 74 09/04/2011 1012   ALKPHOS 74 09/04/2011 1012   ALKPHOS 74 09/04/2011 1012   AST 33 09/04/2011 1012   AST 33 09/04/2011 1012   AST 33 09/04/2011 1012   ALT 24 09/04/2011 1012   ALT 24 09/04/2011 1012   ALT 24 09/04/2011 1012   BILITOT 0.5 09/04/2011 1012   BILITOT 0.5 09/04/2011 1012   BILITOT 0.5 09/04/2011 1012       Lab Results  Component Value Date   WBC 5.1 09/04/2011     HGB 12.5* 09/04/2011   HCT 37.0* 09/04/2011   MCV 91.1 09/04/2011   PLT 152 09/04/2011   NEUTROABS 3.2 09/04/2011    Studies/Results:  No new results found.  Assessment: 61 year old [place] man with a history of multiple myeloma diagnosed February of 2012, with an initial M. spike of 2.6 g/dL, high ACE showing an immunoglobulin G. lambda paraprotein and free lambda light chains in the urine. Cytogenetics showed trisomy 16. Bone marrow biopsy showed a 22% plasmacytosis. Treated with  (1) bortezomib subcutaneously, lenalidomide, and dexamethasone, with repeat bone marrow biopsy may of 2012 showing 10% plasmacytosis  (2) high-dose chemotherapy with BCNU and melphalan at [facility], followed by stem cell rescue July of 2012  (3) on monthly zoledronic acid started December of 2012  Plan: I think he will do better with the zoledronic acid if she takes calcium the day before the day of and the day after each treatment and he will implement this. He will see Dr. Barbaraann Boys at [facility] in February and will see me again in March. We will follow his SPEP and protein/creatinine urinary ratio monthly. He knows to call for any problems that might occur before the next visit.  Andre [name] 09/26/2011

## 2011-09-28 LAB — PROTEIN ELECTROPHORESIS, SERUM
Albumin ELP: 64.4 % (ref 55.8–66.1)
Beta 2: 3.2 % (ref 3.2–6.5)
Total Protein, Serum Electrophoresis: 6.2 g/dL (ref 6.0–8.3)

## 2011-09-28 LAB — KAPPA/LAMBDA LIGHT CHAINS
Kappa:Lambda Ratio: 0.96 (ref 0.26–1.65)
Lambda Free Lght Chn: 1.47 mg/dL (ref 0.57–2.63)

## 2011-09-28 LAB — BASIC METABOLIC PANEL
CO2: 25 mEq/L (ref 19–32)
Chloride: 106 mEq/L (ref 96–112)
Potassium: 4.3 mEq/L (ref 3.5–5.3)
Sodium: 139 mEq/L (ref 135–145)

## 2011-10-02 ENCOUNTER — Ambulatory Visit (HOSPITAL_BASED_OUTPATIENT_CLINIC_OR_DEPARTMENT_OTHER): Payer: PRIVATE HEALTH INSURANCE

## 2011-10-02 ENCOUNTER — Other Ambulatory Visit: Payer: PRIVATE HEALTH INSURANCE | Admitting: Lab

## 2011-10-02 DIAGNOSIS — C9000 Multiple myeloma not having achieved remission: Secondary | ICD-10-CM

## 2011-10-02 LAB — CBC WITH DIFFERENTIAL/PLATELET
BASO%: 0.4 % (ref 0.0–2.0)
Eosinophils Absolute: 0.1 10*3/uL (ref 0.0–0.5)
HCT: 37.6 % — ABNORMAL LOW (ref 38.4–49.9)
LYMPH%: 19.3 % (ref 14.0–49.0)
MCHC: 34.4 g/dL (ref 32.0–36.0)
MONO#: 0.5 10*3/uL (ref 0.1–0.9)
NEUT#: 2.9 10*3/uL (ref 1.5–6.5)
NEUT%: 65.8 % (ref 39.0–75.0)
Platelets: 168 10*3/uL (ref 140–400)
WBC: 4.4 10*3/uL (ref 4.0–10.3)
lymph#: 0.8 10*3/uL — ABNORMAL LOW (ref 0.9–3.3)

## 2011-10-02 MED ORDER — SODIUM CHLORIDE 0.9 % IV SOLN
Freq: Once | INTRAVENOUS | Status: AC
  Administered 2011-10-02: 09:00:00 via INTRAVENOUS

## 2011-10-02 MED ORDER — ZOLEDRONIC ACID 4 MG/100ML IV SOLN
4.0000 mg | Freq: Once | INTRAVENOUS | Status: AC
  Administered 2011-10-02: 4 mg via INTRAVENOUS
  Filled 2011-10-02: qty 100

## 2011-10-04 LAB — COMPREHENSIVE METABOLIC PANEL
ALT: 19 U/L (ref 0–53)
CO2: 23 mEq/L (ref 19–32)
Calcium: 9.3 mg/dL (ref 8.4–10.5)
Chloride: 105 mEq/L (ref 96–112)
Glucose, Bld: 94 mg/dL (ref 70–99)
Sodium: 138 mEq/L (ref 135–145)
Total Bilirubin: 0.4 mg/dL (ref 0.3–1.2)
Total Protein: 6.2 g/dL (ref 6.0–8.3)

## 2011-10-04 LAB — PROTEIN ELECTROPHORESIS, SERUM
Albumin ELP: 65.3 % (ref 55.8–66.1)
Alpha-1-Globulin: 4.1 % (ref 2.9–4.9)
Alpha-2-Globulin: 9.7 % (ref 7.1–11.8)
Beta 2: 3.2 % (ref 3.2–6.5)
Beta Globulin: 6.4 % (ref 4.7–7.2)
Gamma Globulin: 11.3 % (ref 11.1–18.8)

## 2011-10-04 LAB — PROTEIN / CREATININE RATIO, URINE

## 2011-10-25 ENCOUNTER — Encounter: Payer: Self-pay | Admitting: *Deleted

## 2011-10-25 NOTE — Progress Notes (Unsigned)
Pt reports that he has cold sx, sore throat, body aches, but is afebrile. Pt inquires of MD, to recommend a PCP. Call back # [phone]

## 2011-10-30 ENCOUNTER — Other Ambulatory Visit (HOSPITAL_BASED_OUTPATIENT_CLINIC_OR_DEPARTMENT_OTHER): Payer: PRIVATE HEALTH INSURANCE | Admitting: Lab

## 2011-10-30 ENCOUNTER — Ambulatory Visit (HOSPITAL_BASED_OUTPATIENT_CLINIC_OR_DEPARTMENT_OTHER): Payer: PRIVATE HEALTH INSURANCE

## 2011-10-30 DIAGNOSIS — C9000 Multiple myeloma not having achieved remission: Secondary | ICD-10-CM

## 2011-10-30 LAB — PROTEIN / CREATININE RATIO, URINE
Creatinine, Urine: 121.7 mg/dL
Protein Creatinine Ratio: 0.03 (ref <0.15)
Total Protein, Urine: 4 mg/dL

## 2011-10-30 LAB — CBC WITH DIFFERENTIAL/PLATELET
BASO%: 0.4 % (ref 0.0–2.0)
Basophils Absolute: 0 10*3/uL (ref 0.0–0.1)
EOS%: 4.2 % (ref 0.0–7.0)
MCH: 32.5 pg (ref 27.2–33.4)
MCHC: 34.8 g/dL (ref 32.0–36.0)
MCV: 93.2 fL (ref 79.3–98.0)
MONO%: 13.4 % (ref 0.0–14.0)
RBC: 4.25 10*6/uL (ref 4.20–5.82)
RDW: 14.4 % (ref 11.0–14.6)
lymph#: 1.2 10*3/uL (ref 0.9–3.3)
nRBC: 0 % (ref 0–0)

## 2011-10-30 MED ORDER — ZOLEDRONIC ACID 4 MG/100ML IV SOLN
4.0000 mg | Freq: Once | INTRAVENOUS | Status: AC
  Administered 2011-10-30: 4 mg via INTRAVENOUS
  Filled 2011-10-30: qty 100

## 2011-10-30 MED ORDER — SODIUM CHLORIDE 0.9 % IV SOLN
Freq: Once | INTRAVENOUS | Status: AC
  Administered 2011-10-30: 08:00:00 via INTRAVENOUS

## 2011-11-01 LAB — PROTEIN ELECTROPHORESIS, SERUM
Albumin ELP: 62.6 % (ref 55.8–66.1)
Total Protein, Serum Electrophoresis: 6.3 g/dL (ref 6.0–8.3)

## 2011-11-01 LAB — COMPREHENSIVE METABOLIC PANEL
ALT: 28 U/L (ref 0–53)
Albumin: 4.1 g/dL (ref 3.5–5.2)
Alkaline Phosphatase: 74 U/L (ref 39–117)
CO2: 23 mEq/L (ref 19–32)
Glucose, Bld: 91 mg/dL (ref 70–99)
Potassium: 4.5 mEq/L (ref 3.5–5.3)
Sodium: 138 mEq/L (ref 135–145)
Total Protein: 6.3 g/dL (ref 6.0–8.3)

## 2011-11-02 ENCOUNTER — Ambulatory Visit: Payer: PRIVATE HEALTH INSURANCE

## 2011-11-07 ENCOUNTER — Other Ambulatory Visit: Payer: Self-pay | Admitting: *Deleted

## 2011-11-07 DIAGNOSIS — C9000 Multiple myeloma not having achieved remission: Secondary | ICD-10-CM

## 2011-11-07 MED ORDER — OMEPRAZOLE 40 MG PO CPDR: 40.0000 mg | DELAYED_RELEASE_CAPSULE | Freq: Every day | ORAL | Status: DC

## 2011-11-10 ENCOUNTER — Other Ambulatory Visit: Payer: Self-pay | Admitting: *Deleted

## 2011-11-13 ENCOUNTER — Telehealth: Payer: Self-pay | Admitting: *Deleted

## 2011-11-13 NOTE — Telephone Encounter (Signed)
per orders from 11-10-2011 added on zometa on 11-30-2011

## 2011-11-29 ENCOUNTER — Other Ambulatory Visit (HOSPITAL_BASED_OUTPATIENT_CLINIC_OR_DEPARTMENT_OTHER): Payer: PRIVATE HEALTH INSURANCE | Admitting: Lab

## 2011-11-29 DIAGNOSIS — C9000 Multiple myeloma not having achieved remission: Secondary | ICD-10-CM

## 2011-11-29 LAB — PROTEIN / CREATININE RATIO, URINE

## 2011-11-29 LAB — CBC WITH DIFFERENTIAL/PLATELET
BASO%: 0.3 % (ref 0.0–2.0)
Basophils Absolute: 0 10*3/uL (ref 0.0–0.1)
EOS%: 7.6 % — ABNORMAL HIGH (ref 0.0–7.0)
HCT: 38.1 % — ABNORMAL LOW (ref 38.4–49.9)
LYMPH%: 20.1 % (ref 14.0–49.0)
MCH: 31.5 pg (ref 27.2–33.4)
MCHC: 33.6 g/dL (ref 32.0–36.0)
MCV: 93.8 fL (ref 79.3–98.0)
MONO%: 8.7 % (ref 0.0–14.0)
NEUT%: 63.3 % (ref 39.0–75.0)
lymph#: 1.2 10*3/uL (ref 0.9–3.3)

## 2011-11-30 ENCOUNTER — Ambulatory Visit (HOSPITAL_BASED_OUTPATIENT_CLINIC_OR_DEPARTMENT_OTHER): Payer: PRIVATE HEALTH INSURANCE | Admitting: Oncology

## 2011-11-30 ENCOUNTER — Other Ambulatory Visit: Payer: Self-pay | Admitting: Oncology

## 2011-11-30 ENCOUNTER — Ambulatory Visit (HOSPITAL_BASED_OUTPATIENT_CLINIC_OR_DEPARTMENT_OTHER): Payer: PRIVATE HEALTH INSURANCE

## 2011-11-30 DIAGNOSIS — C9000 Multiple myeloma not having achieved remission: Secondary | ICD-10-CM

## 2011-11-30 MED ORDER — ZOLEDRONIC ACID 4 MG/100ML IV SOLN
4.0000 mg | Freq: Once | INTRAVENOUS | Status: AC
  Administered 2011-11-30: 4 mg via INTRAVENOUS
  Filled 2011-11-30: qty 100

## 2011-11-30 NOTE — Patient Instructions (Signed)
Pt aware of next appt and knows to call office if any problems.

## 2011-11-30 NOTE — Progress Notes (Signed)
ID: [name]   DOB: 1951-06-19  MR#: [id number]  CSN#:[id number]  HISTORY OF PRESENT ILLNESS: The patient was worked up for peptic ulcer disease in August of 2011, with significant bleeding and anemia.  The patient was Helicobacter pylori negative.  He received some epinephrine when he had his EGD and then started on Protonix.  [name] anemia slowly resolved so that by September, his hemoglobin was up to 10.9 and by earlier this month, his hemoglobin was up to 12.5.    As part of his general workup, he was found to have a slightly elevated globulin fraction.  In September, his total protein was 8.3 with an albumin of 3.8.  In January, the total protein was 8.4 and albumin 3.6.  With persistence of this slight abnormality, Dr. Juanda Chance obtained serum immunofixation and SPEP.  The SPTP showed an M-spike of 2.67 grams.  A total IgG was 4,190.  Total IgA low at 28.  Total IgM low at 28 also.  The immunofixation showed a monoclonal IgG lambda paraprotein.  There were also monoclonal free lambda light chains present. With this information, the patient was referred for further evaluation. 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 He saw Dr. Twanna Hy last month and he understands her to wish him to start Revlimid at this point. I have not yet received a note from her.  REVIEW OF SYSTEMS: He has had a "head cold" for about a month. This was manifested by cough and a little bit of a runny nose. There was no fever, phlegm production, and only minimal aches and pains. He takes Tylenol and Zyrtec for that. Otherwise a detailed review of systems is noncontributory  PAST MEDICAL HISTORY: Past Medical History  Diagnosis Date  . Hypertension   . Hyperlipidemia   . Duodenal ulcer   .  Multiple myeloma 07/04/2011  Significant for peptic ulcer disease as noted above.  History of hyperlipidemia.  History of anxiety and depression.  History of GERD which is significantly improved with weight loss and history of reactive airway disease, possibly secondary to the GERD which also improved with weight loss.  There was a history of obesity, now much improved secondary to exercise and diet.   PAST SURGICAL HISTORY: Past Surgical History  Procedure Date  . Cardiolite study 11/25/2003    NORMAL    FAMILY HISTORY Family History  Problem Relation Age of Onset  . Cancer Mother   . Hypertension Father   . Stroke Father   . Asthma Father   . Diabetes Father   [name] father died at the age of 44 following a stroke.  [name] mother died with cancer of the throat at the age of 73.   She had a history of depression and was a smoker.  Not clear how much alcohol she drank, according to the patient.  The patient had two brothers.  One died from the age of 6 from a "kidney problem."  The second one died at the age of 71 from pancreatic cancer. (This may have been duodenal cancer. 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  His wife of 37 years is [name].  Unfortunately, they are in the process of divorce, although they continue to live in the same house. The patient has two daughters:  Andre [name] who is 7, and Andre [name] who is 5.  They both live here in [place]. 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  The patient is very close to his daughters.  There are no grandchildren.  The patient is not a church attender.  He does derive a great deal of support from friends at the "Y" which he attends very regularly, and also participates in Al-Anon even though actually there is no alcohol or drug problem in the family.  He  participates in this group and he gets quite a bit of support from it.     ADVANCED DIRECTIVES:  HEALTH MAINTENANCE: History  Substance Use Topics  . Smoking status: Former Games developer  . Smokeless tobacco: Not on file  . Alcohol Use: No     Colonoscopy:  PSA:  Lipid panel:  Allergies  Allergen Reactions  . Crestor (Rosuvastatin Calcium)   . Lipitor (Atorvastatin Calcium)     Current Outpatient Prescriptions  Medication Sig Dispense Refill  . aspirin 81 MG tablet Take 81 mg by mouth daily.        . Cetirizine HCl (ZYRTEC PO) Take by mouth daily.        [name] ezetimibe (ZETIA) 10 MG tablet Take 1 tablet (10 mg total) by mouth daily.  90 tablet  3  . famciclovir (FAMVIR) 250 MG tablet Take 500 mg by mouth.        . fish oil-omega-3 fatty acids 1000 MG capsule Take by mouth daily.       . fluticasone (FLOVENT HFA) 110 MCG/ACT inhaler Inhale 1 puff into the lungs 2 (two) times daily. fLOVENT 1 PUFF QID.       [name] omeprazole (PRILOSEC) 40 MG capsule Take 1 capsule (40 mg total) by mouth daily.  30 capsule  2  . 5-Hydroxytryptophan (5-HTP PO) Take by mouth daily.        . IRON PO Take by mouth daily.        . SELENIUM PO Take by mouth daily.        [name] triamcinolone (KENALOG) 0.1 % cream Apply 1 application topically 2 (two) times daily.          OBJECTIVE: Middle-aged white male who appears tired Filed Vitals:   11/30/11 0849  BP: 127/82  Pulse: 96  Temp: 97.4 F (36.3 C)     Body mass index is 25.18 kg/(m^2).    ECOG FS: 0  Sclerae unicteric Oropharynx clear No peripheral adenopathy Lungs no rales or rhonchi Heart regular rate and rhythm Abd benign MSK no focal spinal tenderness, no peripheral edema Neuro: nonfocal  LAB RESULTS: His M spike here it was not detectable last checked. It was 0.13 at [facility]. His beta 2 microglobulin was minimally elevated but again he's been having a little bit of a head cold. His urine protein/creatinine ratio was essentially  Unmeasurable.  Lab  Results  Component Value Date   WBC 5.8 11/29/2011   NEUTROABS 3.7 11/29/2011   HGB 12.8* 11/29/2011   HCT 38.1* 11/29/2011   MCV 93.8 11/29/2011   PLT 156 11/29/2011      Chemistry      Component Value Date/Time   NA 139 11/29/2011 0740   K 5.3 11/29/2011 0740   CL 105 11/29/2011 0740   CO2 27 11/29/2011 0740  BUN 18 11/29/2011 0740   CREATININE 1.00 11/29/2011 0740      Component Value Date/Time   CALCIUM 9.5 11/29/2011 0740   ALKPHOS 89 11/29/2011 0740   AST 34 11/29/2011 0740   ALT 30 11/29/2011 0740   BILITOT 0.4 11/29/2011 0740       No results found for this basename: LABCA2    No components found with this basename: LABCA125    No results found for this basename: INR:1;PROTIME:1 in the last 168 hours  Urinalysis    Component Value Date/Time   COLORURINE YELLOW 05/02/2010 0957   APPEARANCEUR CLEAR 05/02/2010 0957   LABSPEC 1.024 05/02/2010 0957   PHURINE 5.5 05/02/2010 0957   GLUCOSEU NEGATIVE 05/02/2010 0957   HGBUR NEGATIVE 05/02/2010 0957   BILIRUBINUR NEGATIVE 05/02/2010 0957   KETONESUR 15* 05/02/2010 0957   PROTEINUR NEGATIVE 05/02/2010 0957   UROBILINOGEN 0.2 05/02/2010 0957   NITRITE NEGATIVE 05/02/2010 0957   LEUKOCYTESUR NEGATIVE MICROSCOPIC NOT DONE ON URINES WITH NEGATIVE PROTEIN, BLOOD, LEUKOCYTES, NITRITE, OR GLUCOSE <1000 mg/dL. 05/02/2010 0957       STUDIES: No new results found.  ASSESSMENT: 61 year old [place] man with a history of multiple myeloma diagnosed February of 2012, with an initial M. spike of 2.6 g/dL, IFE showing an IgG lambda paraprotein and free lambda light chains in the urine. Cytogenetics showed trisomy 33. Bone marrow biopsy showed a 22% plasmacytosis. Treated with  (1) bortezomib subcutaneously, lenalidomide, and dexamethasone, with repeat bone marrow biopsy may of 2012 showing 10% plasmacytosis  (2) high-dose chemotherapy with BCNU and melphalan at [facility], followed by stem cell rescue July of 2012  (3) on monthly zoledronic acid  started December of 2012   PLAN: We are continuing the zoledronic acid monthly. We are adding a bortezomib at 10 mg daily 21 days on and 7 days off. We are going to continue to follow his labs on a monthly basis and he will be seen here every 3 months, with next visit at [facility] planned for August. We will repeat a bone survey prior to the [facility] visit. He is aware of the possible toxicities side effects and complications of Revlimid, which he has had before. He knows to call for any problems that may develop before the next visit  Nathalia Wismer C    11/30/2011

## 2011-12-01 LAB — COMPREHENSIVE METABOLIC PANEL
ALT: 30 U/L (ref 0–53)
AST: 34 U/L (ref 0–37)
Alkaline Phosphatase: 89 U/L (ref 39–117)
BUN: 18 mg/dL (ref 6–23)
Creatinine, Ser: 1 mg/dL (ref 0.50–1.35)
Total Bilirubin: 0.4 mg/dL (ref 0.3–1.2)

## 2011-12-01 LAB — PROTEIN ELECTROPHORESIS, SERUM
Albumin ELP: 61.7 % (ref 55.8–66.1)
Beta 2: 3.7 % (ref 3.2–6.5)
Total Protein, Serum Electrophoresis: 6.6 g/dL (ref 6.0–8.3)

## 2011-12-07 ENCOUNTER — Other Ambulatory Visit: Payer: Self-pay | Admitting: *Deleted

## 2011-12-07 ENCOUNTER — Other Ambulatory Visit: Payer: Self-pay | Admitting: Oncology

## 2011-12-07 MED ORDER — LENALIDOMIDE 10 MG PO CAPS: ORAL_CAPSULE | ORAL | Status: DC

## 2011-12-27 ENCOUNTER — Telehealth: Payer: Self-pay | Admitting: *Deleted

## 2011-12-27 NOTE — Telephone Encounter (Signed)
Pt called to this RN to state onset post resuming revlimid of constipation and feeling achy.  Per discussion [name] will increase dosage of miralax as well as use OTC med like Gas X for discomfort.  [name] understands to call office if needed.

## 2011-12-28 ENCOUNTER — Ambulatory Visit (HOSPITAL_BASED_OUTPATIENT_CLINIC_OR_DEPARTMENT_OTHER): Payer: PRIVATE HEALTH INSURANCE

## 2011-12-28 ENCOUNTER — Other Ambulatory Visit (HOSPITAL_BASED_OUTPATIENT_CLINIC_OR_DEPARTMENT_OTHER): Payer: PRIVATE HEALTH INSURANCE | Admitting: Lab

## 2011-12-28 DIAGNOSIS — C9000 Multiple myeloma not having achieved remission: Secondary | ICD-10-CM

## 2011-12-28 LAB — PROTEIN / CREATININE RATIO, URINE
Creatinine, Urine: 130.9 mg/dL
Total Protein, Urine: 13 mg/dL

## 2011-12-28 LAB — CBC WITH DIFFERENTIAL/PLATELET
Basophils Absolute: 0 10*3/uL (ref 0.0–0.1)
Eosinophils Absolute: 0.1 10*3/uL (ref 0.0–0.5)
HCT: 34.5 % — ABNORMAL LOW (ref 38.4–49.9)
HGB: 11.7 g/dL — ABNORMAL LOW (ref 13.0–17.1)
MCV: 96.3 fL (ref 79.3–98.0)
MONO%: 17 % — ABNORMAL HIGH (ref 0.0–14.0)
NEUT#: 2.6 10*3/uL (ref 1.5–6.5)
NEUT%: 65.4 % (ref 39.0–75.0)
Platelets: 120 10*3/uL — ABNORMAL LOW (ref 140–400)
RDW: 14.7 % — ABNORMAL HIGH (ref 11.0–14.6)

## 2011-12-28 MED ORDER — ZOLEDRONIC ACID 4 MG/100ML IV SOLN
4.0000 mg | Freq: Once | INTRAVENOUS | Status: AC
  Administered 2011-12-28: 4 mg via INTRAVENOUS
  Filled 2011-12-28: qty 100

## 2012-01-01 LAB — COMPREHENSIVE METABOLIC PANEL
Albumin: 3.8 g/dL (ref 3.5–5.2)
CO2: 22 mEq/L (ref 19–32)
Glucose, Bld: 103 mg/dL — ABNORMAL HIGH (ref 70–99)
Sodium: 140 mEq/L (ref 135–145)
Total Bilirubin: 0.5 mg/dL (ref 0.3–1.2)
Total Protein: 6 g/dL (ref 6.0–8.3)

## 2012-01-01 LAB — PROTEIN ELECTROPHORESIS, SERUM
Albumin ELP: 59.2 % (ref 55.8–66.1)
Beta Globulin: 6.8 % (ref 4.7–7.2)
Total Protein, Serum Electrophoresis: 6 g/dL (ref 6.0–8.3)

## 2012-01-03 ENCOUNTER — Other Ambulatory Visit: Payer: Self-pay | Admitting: *Deleted

## 2012-01-03 MED ORDER — LENALIDOMIDE 10 MG PO CAPS: ORAL_CAPSULE | ORAL | Status: DC

## 2012-01-08 ENCOUNTER — Telehealth: Payer: Self-pay | Admitting: *Deleted

## 2012-01-08 NOTE — Telephone Encounter (Signed)
Andre [name] called to this RN due to mild tenderness and redness at site of port ( port has been removed ).  Area is not hot to touch but Andre [name] did notice " feels kinda hard there ".  Andre [name] states he has had some rashes with start of Revlimid "but this seems different "  This note will be reviewed with MD for appropriate follow up.

## 2012-01-25 ENCOUNTER — Ambulatory Visit (HOSPITAL_BASED_OUTPATIENT_CLINIC_OR_DEPARTMENT_OTHER): Payer: PRIVATE HEALTH INSURANCE

## 2012-01-25 ENCOUNTER — Other Ambulatory Visit (HOSPITAL_BASED_OUTPATIENT_CLINIC_OR_DEPARTMENT_OTHER): Payer: PRIVATE HEALTH INSURANCE

## 2012-01-25 DIAGNOSIS — C9000 Multiple myeloma not having achieved remission: Secondary | ICD-10-CM

## 2012-01-25 LAB — CBC WITH DIFFERENTIAL/PLATELET
Basophils Absolute: 0.1 10*3/uL (ref 0.0–0.1)
EOS%: 15.4 % — ABNORMAL HIGH (ref 0.0–7.0)
Eosinophils Absolute: 0.7 10*3/uL — ABNORMAL HIGH (ref 0.0–0.5)
HCT: 37.9 % — ABNORMAL LOW (ref 38.4–49.9)
HGB: 12.9 g/dL — ABNORMAL LOW (ref 13.0–17.1)
MCH: 30.9 pg (ref 27.2–33.4)
MCV: 90.9 fL (ref 79.3–98.0)
MONO%: 14.1 % — ABNORMAL HIGH (ref 0.0–14.0)
NEUT#: 2.2 10*3/uL (ref 1.5–6.5)
NEUT%: 48.3 % (ref 39.0–75.0)
RDW: 14.6 % (ref 11.0–14.6)
lymph#: 1 10*3/uL (ref 0.9–3.3)

## 2012-01-25 MED ORDER — ZOLEDRONIC ACID 4 MG/100ML IV SOLN
4.0000 mg | Freq: Once | INTRAVENOUS | Status: AC
  Administered 2012-01-25: 4 mg via INTRAVENOUS
  Filled 2012-01-25: qty 100

## 2012-01-25 MED ORDER — SODIUM CHLORIDE 0.9 % IJ SOLN
10.0000 mL | INTRAMUSCULAR | Status: DC | PRN
  Filled 2012-01-25: qty 10

## 2012-01-25 NOTE — Progress Notes (Signed)
Pt seems down and somewhat anxious today.  Pt states that he is overwhelmed in dealing with "life."  Pt denies being suicidal.  RN educating patient on antidepressants and taking them on a temporary basis.  Pt verbalized understanding but did not state that it was something he was interested in at the time.  Pt also states a slight increase in urinary frequency and rare occasions where he felt his bladder did not empty completely. Pt states this is new but really seemed distracted and uninterested in giving more details.  RN will leave notice with Dr. Darnelle Catalan of the findings.

## 2012-01-25 NOTE — Patient Instructions (Signed)
Pt discharged home ambulatory.  Pt to call with questions and concerns.  Pt encouraged to call if having any further lingering depression

## 2012-01-26 LAB — PROTEIN / CREATININE RATIO, URINE
Creatinine, Urine: 20.1 mg/dL
Total Protein, Urine: <3 mg/dL

## 2012-01-29 ENCOUNTER — Other Ambulatory Visit: Payer: Self-pay | Admitting: *Deleted

## 2012-01-29 LAB — COMPREHENSIVE METABOLIC PANEL
Albumin: 4.1 g/dL (ref 3.5–5.2)
BUN: 20 mg/dL (ref 6–23)
CO2: 23 mEq/L (ref 19–32)
Glucose, Bld: 85 mg/dL (ref 70–99)
Potassium: 4.3 mEq/L (ref 3.5–5.3)
Sodium: 138 mEq/L (ref 135–145)
Total Protein: 6.5 g/dL (ref 6.0–8.3)

## 2012-01-29 LAB — BETA 2 MICROGLOBULIN, SERUM: Beta-2 Microglobulin: 2.83 mg/L — ABNORMAL HIGH (ref 1.01–1.73)

## 2012-01-29 LAB — PROTEIN ELECTROPHORESIS, SERUM
Albumin ELP: 60.4 % (ref 55.8–66.1)
Alpha-1-Globulin: 4.5 % (ref 2.9–4.9)
Total Protein, Serum Electrophoresis: 6.5 g/dL (ref 6.0–8.3)

## 2012-01-29 LAB — KAPPA/LAMBDA LIGHT CHAINS: Lambda Free Lght Chn: 2.52 mg/dL (ref 0.57–2.63)

## 2012-01-30 ENCOUNTER — Telehealth: Payer: Self-pay | Admitting: *Deleted

## 2012-01-30 ENCOUNTER — Other Ambulatory Visit: Payer: Self-pay | Admitting: Oncology

## 2012-01-30 DIAGNOSIS — C9000 Multiple myeloma not having achieved remission: Secondary | ICD-10-CM

## 2012-01-30 NOTE — Telephone Encounter (Signed)
per orders from 01-30-2012 labs have been added on for 02-22-2012 at 8:30am

## 2012-02-08 ENCOUNTER — Other Ambulatory Visit: Payer: Self-pay | Admitting: *Deleted

## 2012-02-08 DIAGNOSIS — C9000 Multiple myeloma not having achieved remission: Secondary | ICD-10-CM

## 2012-02-08 MED ORDER — OMEPRAZOLE 40 MG PO CPDR: 40.0000 mg | DELAYED_RELEASE_CAPSULE | Freq: Every day | ORAL | Status: DC

## 2012-02-22 ENCOUNTER — Ambulatory Visit (HOSPITAL_BASED_OUTPATIENT_CLINIC_OR_DEPARTMENT_OTHER): Payer: PRIVATE HEALTH INSURANCE

## 2012-02-22 ENCOUNTER — Other Ambulatory Visit (HOSPITAL_BASED_OUTPATIENT_CLINIC_OR_DEPARTMENT_OTHER): Payer: PRIVATE HEALTH INSURANCE | Admitting: Lab

## 2012-02-22 ENCOUNTER — Other Ambulatory Visit: Payer: Self-pay | Admitting: Oncology

## 2012-02-22 VITALS — BP 127/75 | HR 62 | Temp 98.5°F

## 2012-02-22 DIAGNOSIS — C9000 Multiple myeloma not having achieved remission: Secondary | ICD-10-CM

## 2012-02-22 LAB — CBC WITH DIFFERENTIAL/PLATELET
Basophils Absolute: 0 10*3/uL (ref 0.0–0.1)
Eosinophils Absolute: 0.3 10*3/uL (ref 0.0–0.5)
HGB: 12.5 g/dL — ABNORMAL LOW (ref 13.0–17.1)
MCV: 91 fL (ref 79.3–98.0)
MONO#: 0.4 10*3/uL (ref 0.1–0.9)
MONO%: 13.1 % (ref 0.0–14.0)
NEUT#: 1.4 10*3/uL — ABNORMAL LOW (ref 1.5–6.5)
RDW: 15 % — ABNORMAL HIGH (ref 11.0–14.6)
WBC: 3.1 10*3/uL — ABNORMAL LOW (ref 4.0–10.3)
lymph#: 0.9 10*3/uL (ref 0.9–3.3)
nRBC: 0 % (ref 0–0)

## 2012-02-22 LAB — PROTEIN / CREATININE RATIO, URINE
Creatinine, Urine: 34.6 mg/dL
Total Protein, Urine: <3 mg/dL

## 2012-02-22 MED ORDER — HEPARIN SOD (PORK) LOCK FLUSH 100 UNIT/ML IV SOLN
500.0000 [IU] | Freq: Once | INTRAVENOUS | Status: DC | PRN
  Filled 2012-02-22: qty 5

## 2012-02-22 MED ORDER — ALTEPLASE 2 MG IJ SOLR
2.0000 mg | Freq: Once | INTRAMUSCULAR | Status: DC | PRN
  Filled 2012-02-22: qty 2

## 2012-02-22 MED ORDER — SODIUM CHLORIDE 0.9 % IJ SOLN
10.0000 mL | INTRAMUSCULAR | Status: DC | PRN
  Filled 2012-02-22: qty 10

## 2012-02-22 MED ORDER — ZOLEDRONIC ACID 4 MG/100ML IV SOLN
4.0000 mg | Freq: Once | INTRAVENOUS | Status: AC
  Administered 2012-02-22: 4 mg via INTRAVENOUS
  Filled 2012-02-22: qty 100

## 2012-02-22 MED ORDER — HEPARIN SOD (PORK) LOCK FLUSH 100 UNIT/ML IV SOLN
250.0000 [IU] | Freq: Once | INTRAVENOUS | Status: DC | PRN
  Filled 2012-02-22: qty 5

## 2012-02-22 MED ORDER — SODIUM CHLORIDE 0.9 % IJ SOLN
3.0000 mL | Freq: Once | INTRAMUSCULAR | Status: DC | PRN
  Filled 2012-02-22: qty 10

## 2012-02-22 NOTE — Patient Instructions (Signed)
[facility] Discharge Instructions for Patients Receiving Chemotherapy  Today you received the following chemotherapy agents Zometa To help prevent nausea and vomiting after your treatment, we encourage you to take your nausea medication as prescribed. If you develop nausea and vomiting that is not controlled by your nausea medication, call the clinic. If it is after clinic hours your family physician or the after hours number for the clinic or go to the Emergency Department.   BELOW ARE SYMPTOMS THAT SHOULD BE REPORTED IMMEDIATELY:  *FEVER GREATER THAN 100.5 F  *CHILLS WITH OR WITHOUT FEVER  NAUSEA AND VOMITING THAT IS NOT CONTROLLED WITH YOUR NAUSEA MEDICATION  *UNUSUAL SHORTNESS OF BREATH  *UNUSUAL BRUISING OR BLEEDING  TENDERNESS IN MOUTH AND THROAT WITH OR WITHOUT PRESENCE OF ULCERS  *URINARY PROBLEMS  *BOWEL PROBLEMS  UNUSUAL RASH Items with * indicate a potential emergency and should be followed up as soon as possible.  One of the nurses will contact you 24 hours after your treatment. Please let the nurse know about any problems that you may have experienced. Feel free to call the clinic you have any questions or concerns. The clinic phone number is [phone].   I have been informed and understand all the instructions given to me. I know to contact the clinic, my physician, or go to the Emergency Department if any problems should occur. I do not have any questions at this time, but understand that I may call the clinic during office hours   should I have any questions or need assistance in obtaining follow up care.    __________________________________________  _____________  __________ Signature of Patient or Authorized Representative            Date                   Time    __________________________________________ Nurse's Signature    

## 2012-02-26 LAB — COMPREHENSIVE METABOLIC PANEL
ALT: 30 U/L (ref 0–53)
Albumin: 4.3 g/dL (ref 3.5–5.2)
CO2: 25 mEq/L (ref 19–32)
Calcium: 9 mg/dL (ref 8.4–10.5)
Chloride: 105 mEq/L (ref 96–112)
Glucose, Bld: 66 mg/dL — ABNORMAL LOW (ref 70–99)
Potassium: 3.9 mEq/L (ref 3.5–5.3)
Sodium: 138 mEq/L (ref 135–145)
Total Protein: 6.7 g/dL (ref 6.0–8.3)

## 2012-02-26 LAB — PROTEIN ELECTROPHORESIS, SERUM
Albumin ELP: 61.6 % (ref 55.8–66.1)
Total Protein, Serum Electrophoresis: 6.7 g/dL (ref 6.0–8.3)

## 2012-02-26 LAB — KAPPA/LAMBDA LIGHT CHAINS: Lambda Free Lght Chn: 3.11 mg/dL — ABNORMAL HIGH (ref 0.57–2.63)

## 2012-02-27 ENCOUNTER — Other Ambulatory Visit: Payer: Self-pay | Admitting: *Deleted

## 2012-02-27 MED ORDER — LENALIDOMIDE 10 MG PO CAPS: ORAL_CAPSULE | ORAL | Status: DC

## 2012-03-04 ENCOUNTER — Ambulatory Visit (HOSPITAL_BASED_OUTPATIENT_CLINIC_OR_DEPARTMENT_OTHER): Payer: PRIVATE HEALTH INSURANCE | Admitting: Oncology

## 2012-03-04 ENCOUNTER — Telehealth: Payer: Self-pay | Admitting: Oncology

## 2012-03-04 ENCOUNTER — Telehealth: Payer: Self-pay | Admitting: *Deleted

## 2012-03-04 VITALS — BP 149/90 | HR 55 | Temp 97.8°F | Ht 70.0 in | Wt 179.7 lb

## 2012-03-04 DIAGNOSIS — C9000 Multiple myeloma not having achieved remission: Secondary | ICD-10-CM

## 2012-03-04 NOTE — Telephone Encounter (Signed)
Per staff message I have scheduled appt. JMW 

## 2012-03-04 NOTE — Progress Notes (Signed)
ID: Andre [name]   DOB: 06-05-1951  MR#: [id number]  WUJ#:[id number]  HISTORY OF PRESENT ILLNESS: The patient was worked up for peptic ulcer disease in August of 2011, with significant bleeding and anemia.  The patient was Helicobacter pylori negative.  He received some epinephrine when he had his EGD and then started on Protonix.  [name] anemia slowly resolved so that by September, his hemoglobin was up to 10.9 and by earlier this month, his hemoglobin was up to 12.5.    As part of his general workup, he was found to have a slightly elevated globulin fraction.  In September, his total protein was 8.3 with an albumin of 3.8.  In January, the total protein was 8.4 and albumin 3.6.  With persistence of this slight abnormality, Dr. Juanda Chance obtained serum immunofixation and SPEP.  The SPTP showed an M-spike of 2.67 grams.  A total IgG was 4,190.  Total IgA low at 28.  Total IgM low at 28 also.  The immunofixation showed a monoclonal IgG lambda paraprotein.  There were also monoclonal free lambda light chains present. With this information, the patient was referred for further evaluation. A diagnosis of myeloma was confirmed by bone marrow biopsy and subsequebnt treatment is as detailed below  INTERVAL HISTORY: Andre [name] returns today with his daughter for followup of his multiple myeloma. He was doing generally well until June 5. He started feeling bad at that day. The symptoms were a little bit date but generally he was not able to keep up with his bicycle group, or felt like exercising. Since her last visit here also he [place] which is much noise year. He is having trouble sleeping, taking some Tylenol PM at night. At any rate, when he started feeling poorly he contacted [facility], and he was told to stay off his Revlimid (he had a ready been off for one week).  REVIEW OF SYSTEMS: He has not had any fever, bleeding, or pain. He has had a little bit of a sore throat, but no drainage,  cough, phlegm production, pleurisy, or shortness of breath. He's had some bilateral ankle swelling. His appetite is poor. He's had a bit of a headache. Otherwise a detailed review of systems today was noncontributory.  PAST MEDICAL HISTORY: Past Medical History  Diagnosis Date  . Hypertension   . Hyperlipidemia   . Duodenal ulcer   . Multiple myeloma 07/04/2011  Significant for peptic ulcer disease as noted above.  History of hyperlipidemia.  History of anxiety and depression.  History of GERD which is significantly improved with weight loss and history of reactive airway disease, possibly secondary to the GERD which also improved with weight loss.  There was a history of obesity, now much improved secondary to exercise and diet.   PAST SURGICAL HISTORY: Past Surgical History  Procedure Date  . Cardiolite study 11/25/2003    NORMAL    FAMILY HISTORY Family History  Problem Relation Age of Onset  . Cancer Mother   . Hypertension Father   . Stroke Father   . Asthma Father   . Diabetes Father   [name] father died at the age of 13 following a stroke.  [name] mother died with cancer of the throat at the age of 68.   She had a history of depression and was a smoker.  Not clear how much alcohol she drank, according to the patient.  The patient had two brothers.  One died from the  age of 6 from a "kidney problem."  The second one died at the age of 39 from pancreatic cancer. (This may have been duodenal cancer. 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  His wife of 37 years is Andre [name].  Unfortunately, they are in the process of divorce. The patient has two daughters:  Andre [name] who is 81, and Andre [name] who is 37.  They both live here in [place]. 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  The patient is very close  to his daughters.  There are no grandchildren.  The patient is not a church attender.  He does derive a great deal of support from friends at the "[employer]" which he attends very regularly, and also participates in Al-Anon even though actually there is no alcohol or drug problem in the family.  He participates in this group and he gets quite a bit of support from it.     ADVANCED DIRECTIVES:  HEALTH MAINTENANCE: History  Substance Use Topics  . Smoking status: Former Games developer  . Smokeless tobacco: Not on file  . Alcohol Use: No     Colonoscopy:  PSA:  Lipid panel:  Allergies  Allergen Reactions  . Crestor (Rosuvastatin Calcium)   . Lipitor (Atorvastatin Calcium)     Current Outpatient Prescriptions  Medication Sig Dispense Refill  . aspirin 81 MG tablet Take 81 mg by mouth daily.        . Cetirizine HCl (ZYRTEC PO) Take by mouth daily.        . diphenhydramine-acetaminophen (TYLENOL PM) 25-500 MG TABS Take 2 tablets by mouth at bedtime as needed.      . famciclovir (FAMVIR) 250 MG tablet Take 500 mg by mouth.        . fish oil-omega-3 fatty acids 1000 MG capsule Take by mouth daily.       . fluticasone (FLOVENT HFA) 110 MCG/ACT inhaler Inhale 1 puff into the lungs 2 (two) times daily. fLOVENT 1 PUFF QID.       [name] IRON PO Take by mouth daily.        [name] omeprazole (PRILOSEC) 40 MG capsule Take 1 capsule (40 mg total) by mouth daily.  30 capsule  2  . sennosides-docusate sodium (SENOKOT-S) 8.6-50 MG tablet Take 1 tablet by mouth daily.      [name] 5-Hydroxytryptophan (5-HTP PO) Take by mouth daily.        [name] ezetimibe (ZETIA) 10 MG tablet Take 1 tablet (10 mg total) by mouth daily.  90 tablet  3  . lenalidomide (REVLIMID) 10 MG capsule 10mg  daily x 21 days / off x 7 days  21 capsule  0  . SELENIUM PO Take by mouth daily.        [name] triamcinolone (KENALOG) 0.1 % cream Apply 1 application topically 2 (two) times daily.        [name] DISCONTD: fexofenadine (ALLEGRA) 60 MG tablet Take 180 mg by mouth daily.           OBJECTIVE: Middle-aged white male in no acute distress Filed Vitals:   03/04/12 1116  BP: 149/90  Pulse: 55  Temp: 97.8 F (36.6 C)     Body mass index is 25.78 kg/(m^2).    ECOG FS: 1  Sclerae unicteric Oropharynx clear No peripheral adenopathy Lungs no rales or rhonchi  Heart regular rate and rhythm Abd benign MSK slight bilateral lower extremity edema, really more in the ankles, right slightly greater than left. Neuro: nonfocal  LAB RESULTS: His kappa light chain continues to increase, currently 5.13. Lambda is 3.11. The SPEP continues to show no monoclonal protein and his urine of protein/creatinine ratio is less than the measurable cut off.  Lab Results  Component Value Date   WBC 3.1* 02/22/2012   NEUTROABS 1.4* 02/22/2012   HGB 12.5* 02/22/2012   HCT 36.5* 02/22/2012   MCV 91.0 02/22/2012   PLT 94* 02/22/2012      Chemistry      Component Value Date/Time   NA 138 02/22/2012 0834   K 3.9 02/22/2012 0834   CL 105 02/22/2012 0834   CO2 25 02/22/2012 0834   BUN 12 02/22/2012 0834   CREATININE 1.12 02/22/2012 0834      Component Value Date/Time   CALCIUM 9.0 02/22/2012 0834   ALKPHOS 49 02/22/2012 0834   AST 39* 02/22/2012 0834   ALT 30 02/22/2012 0834   BILITOT 0.4 02/22/2012 0834       No results found for this basename: LABCA2    No components found with this basename: LABCA125    No results found for this basename: INR:1;PROTIME:1 in the last 168 hours  Urinalysis    Component Value Date/Time   COLORURINE YELLOW 05/02/2010 0957   APPEARANCEUR CLEAR 05/02/2010 0957   LABSPEC 1.024 05/02/2010 0957   PHURINE 5.5 05/02/2010 0957   GLUCOSEU NEGATIVE 05/02/2010 0957   HGBUR NEGATIVE 05/02/2010 0957   BILIRUBINUR NEGATIVE 05/02/2010 0957   KETONESUR 15* 05/02/2010 0957   PROTEINUR NEGATIVE 05/02/2010 0957   UROBILINOGEN 0.2 05/02/2010 0957   NITRITE NEGATIVE 05/02/2010 0957   LEUKOCYTESUR NEGATIVE MICROSCOPIC NOT DONE ON URINES WITH NEGATIVE PROTEIN, BLOOD, LEUKOCYTES, NITRITE, OR  GLUCOSE <1000 mg/dL. 05/02/2010 0957       STUDIES: No new results found.  ASSESSMENT: 61 year old [place] man with a history of multiple myeloma diagnosed February of 2012, with an initial M. spike of 2.6 g/dL, IFE showing an IgG lambda paraprotein and free lambda light chains in the urine. Cytogenetics showed trisomy 69. Bone marrow biopsy showed a 22% plasmacytosis. Treated with  (1) bortezomib subcutaneously, lenalidomide, and dexamethasone, with repeat bone marrow biopsy may of 2012 showing 10% plasmacytosis  (2) high-dose chemotherapy with BCNU and melphalan at [facility], followed by stem cell rescue July of 2012  (3) on monthly zoledronic acid started December of 2012 (4) low-dose lenalidomide resumed April 2013   PLAN: We're holding the lenalidomide for now. I am going to repeat labs this week and then again July 1. She he will see me again on July 2 and we will do zoledronic acid on that day. The fact that both the kappa and lambda are up suggests some kind of inflammatory problem, although I have no localizing symptoms as well as no other significant systemic symptoms beyond fatigue. We will repeat labs on July 1. He is a ready scheduled to be seen at [facility] again late August.  In short I do not feel what we are seeing is recurrence of his myeloma, but an unrelated issue him a possibly viral, and hopefully it will either clear on its own or it will become more diagnostically manifest over the next 2 weeks the Jeannetta Cerutti C    03/04/2012

## 2012-03-04 NOTE — Telephone Encounter (Signed)
gve the pt his June,july 2013 appt calendar. Pt is aware his zometa tx will be added. Sent michelle a staff message

## 2012-03-07 ENCOUNTER — Other Ambulatory Visit (HOSPITAL_BASED_OUTPATIENT_CLINIC_OR_DEPARTMENT_OTHER): Payer: PRIVATE HEALTH INSURANCE | Admitting: Lab

## 2012-03-07 DIAGNOSIS — C9000 Multiple myeloma not having achieved remission: Secondary | ICD-10-CM

## 2012-03-07 LAB — CBC WITH DIFFERENTIAL/PLATELET
Basophils Absolute: 0.1 10*3/uL (ref 0.0–0.1)
Eosinophils Absolute: 0.2 10*3/uL (ref 0.0–0.5)
HCT: 37 % — ABNORMAL LOW (ref 38.4–49.9)
HGB: 12.5 g/dL — ABNORMAL LOW (ref 13.0–17.1)
MCV: 95.5 fL (ref 79.3–98.0)
MONO%: 5.2 % (ref 0.0–14.0)
NEUT#: 2.2 10*3/uL (ref 1.5–6.5)
NEUT%: 59.2 % (ref 39.0–75.0)
RDW: 16.7 % — ABNORMAL HIGH (ref 11.0–14.6)

## 2012-03-08 LAB — KAPPA/LAMBDA LIGHT CHAINS
Kappa free light chain: 2.15 mg/dL — ABNORMAL HIGH (ref 0.33–1.94)
Lambda Free Lght Chn: 1.63 mg/dL (ref 0.57–2.63)

## 2012-03-08 LAB — BETA 2 MICROGLOBULIN, SERUM: Beta-2 Microglobulin: 1.79 mg/L — ABNORMAL HIGH (ref 1.01–1.73)

## 2012-03-10 ENCOUNTER — Other Ambulatory Visit: Payer: Self-pay | Admitting: Cardiology

## 2012-03-12 ENCOUNTER — Other Ambulatory Visit: Payer: Self-pay | Admitting: Oncology

## 2012-03-13 ENCOUNTER — Other Ambulatory Visit: Payer: Self-pay | Admitting: *Deleted

## 2012-03-13 DIAGNOSIS — E7800 Pure hypercholesterolemia, unspecified: Secondary | ICD-10-CM

## 2012-03-13 MED ORDER — EZETIMIBE 10 MG PO TABS: 10.0000 mg | ORAL_TABLET | Freq: Every day | ORAL | Status: DC

## 2012-03-13 NOTE — Telephone Encounter (Signed)
Refilled zetia.

## 2012-03-18 ENCOUNTER — Other Ambulatory Visit: Payer: PRIVATE HEALTH INSURANCE | Admitting: Lab

## 2012-03-18 DIAGNOSIS — C9000 Multiple myeloma not having achieved remission: Secondary | ICD-10-CM

## 2012-03-18 LAB — CBC WITH DIFFERENTIAL/PLATELET
BASO%: 0.9 % (ref 0.0–2.0)
HCT: 36.8 % — ABNORMAL LOW (ref 38.4–49.9)
MCHC: 33.6 g/dL (ref 32.0–36.0)
MONO#: 0.2 10*3/uL (ref 0.1–0.9)
NEUT#: 1.6 10*3/uL (ref 1.5–6.5)
RBC: 3.82 10*6/uL — ABNORMAL LOW (ref 4.20–5.82)
WBC: 2.8 10*3/uL — ABNORMAL LOW (ref 4.0–10.3)
lymph#: 0.7 10*3/uL — ABNORMAL LOW (ref 0.9–3.3)

## 2012-03-18 LAB — PROTEIN / CREATININE RATIO, URINE: Protein Creatinine Ratio: 0.09 (ref <0.15)

## 2012-03-19 ENCOUNTER — Ambulatory Visit (HOSPITAL_BASED_OUTPATIENT_CLINIC_OR_DEPARTMENT_OTHER): Payer: PRIVATE HEALTH INSURANCE | Admitting: Oncology

## 2012-03-19 ENCOUNTER — Ambulatory Visit (HOSPITAL_BASED_OUTPATIENT_CLINIC_OR_DEPARTMENT_OTHER): Payer: PRIVATE HEALTH INSURANCE

## 2012-03-19 ENCOUNTER — Telehealth: Payer: Self-pay | Admitting: *Deleted

## 2012-03-19 ENCOUNTER — Telehealth: Payer: Self-pay | Admitting: Oncology

## 2012-03-19 ENCOUNTER — Other Ambulatory Visit: Payer: PRIVATE HEALTH INSURANCE | Admitting: Lab

## 2012-03-19 VITALS — BP 132/85 | HR 75 | Temp 98.0°F | Ht 70.0 in | Wt 180.9 lb

## 2012-03-19 DIAGNOSIS — C9000 Multiple myeloma not having achieved remission: Secondary | ICD-10-CM

## 2012-03-19 MED ORDER — ZOLEDRONIC ACID 4 MG/100ML IV SOLN
4.0000 mg | Freq: Once | INTRAVENOUS | Status: AC
  Administered 2012-03-19: 4 mg via INTRAVENOUS
  Filled 2012-03-19: qty 100

## 2012-03-19 NOTE — Patient Instructions (Addendum)
[facility] Discharge Instructions for Patients Receiving Chemotherapy  Today you received the following chemotherapy agents Zometa To help prevent nausea and vomiting after your treatment, we encourage you to take your nausea medication as directed by your provider   If you develop nausea and vomiting that is not controlled by your nausea medication, call the clinic. If it is after clinic hours your family physician or the after hours number for the clinic or go to the Emergency Department.   BELOW ARE SYMPTOMS THAT SHOULD BE REPORTED IMMEDIATELY:  *FEVER GREATER THAN 100.5 F  *CHILLS WITH OR WITHOUT FEVER  NAUSEA AND VOMITING THAT IS NOT CONTROLLED WITH YOUR NAUSEA MEDICATION  *UNUSUAL SHORTNESS OF BREATH  *UNUSUAL BRUISING OR BLEEDING  TENDERNESS IN MOUTH AND THROAT WITH OR WITHOUT PRESENCE OF ULCERS  *URINARY PROBLEMS  *BOWEL PROBLEMS  UNUSUAL RASH Items with * indicate a potential emergency and should be followed up as soon as possible.  One of the nurses will contact you 24 hours after your treatment. Please let the nurse know about any problems that you may have experienced. Feel free to call the clinic you have any questions or concerns. The clinic phone number is [phone].   I have been informed and understand all the instructions given to me. I know to contact the clinic, my physician, or go to the Emergency Department if any problems should occur. I do not have any questions at this time, but understand that I may call the clinic during office hours   should I have any questions or need assistance in obtaining follow up care.    __________________________________________  _____________  __________ Signature of Patient or Authorized Representative            Date                   Time    __________________________________________ Nurse's Signature

## 2012-03-19 NOTE — Telephone Encounter (Signed)
Per staff message I have scheduled appts. JMW  

## 2012-03-19 NOTE — Telephone Encounter (Signed)
lmonvm adviisng the pt to pick up his appt calendars for the rest of the appt calendars for sept/oct

## 2012-03-19 NOTE — Progress Notes (Signed)
ID: Andre [name]   DOB: [birth year]  MR#: [id number]  WUJ#:[id number]  HISTORY OF PRESENT ILLNESS: The patient was worked up for peptic ulcer disease in August of 2011, with significant bleeding and anemia.  The patient was Helicobacter pylori negative.  He received some epinephrine when he had his EGD and then started on Protonix.  [name] anemia slowly resolved so that by September, his hemoglobin was up to 10.9 and by earlier this month, his hemoglobin was up to 12.5.    As part of his general workup, he was found to have a slightly elevated globulin fraction.  In September, his total protein was 8.3 with an albumin of 3.8.  In January, the total protein was 8.4 and albumin 3.6.  With persistence of this slight abnormality, Dr. Juanda Chance obtained serum immunofixation and SPEP.  The SPTP showed an M-spike of 2.67 grams.  A total IgG was 4,190.  Total IgA low at 28.  Total IgM low at 28 also.  The immunofixation showed a monoclonal IgG lambda paraprotein.  There were also monoclonal free lambda light chains present. With this information, the patient was referred for further evaluation. A diagnosis of myeloma was confirmed by bone marrow biopsy and subsequebnt treatment is as detailed below  INTERVAL HISTORY: Andre [name] returns today with his daughter for followup of his multiple myeloma. His daughter in particular is concerned because "several people have noticed" that he looks more tired, has less energy, and "looks older". Despite this she keeps up with his vigorous exercise program, including going jogging this morning.   REVIEW OF SYSTEMS: He denies fever. He continues to sleep poorly, even though they have made some changes in his new apartment to limit the nighttime noise. He feels tired. He is constipated, and he takes 2 Senokot at bedtime but does not remember to take the MiraLAX in the morning. He is more forgetful, he tells me. He doesn't know exactly how much fluid he is taking during the day.  He is having some headaches, no nasal discharge, no cough, no pleurisy, no shortness of breath. A detailed review of systems was otherwise noncontributory  PAST MEDICAL HISTORY: Past Medical History  Diagnosis Date  . Hypertension   . Hyperlipidemia   . Duodenal ulcer   . Multiple myeloma 07/04/2011  Significant for peptic ulcer disease as noted above.  History of hyperlipidemia.  History of anxiety and depression.  History of GERD which is significantly improved with weight loss and history of reactive airway disease, possibly secondary to the GERD which also improved with weight loss.  There was a history of obesity, now much improved secondary to exercise and diet.   PAST SURGICAL HISTORY: Past Surgical History  Procedure Date  . Cardiolite study 11/25/2003    NORMAL    FAMILY HISTORY Family History  Problem Relation Age of Onset  . Cancer Mother   . Hypertension Father   . Stroke Father   . Asthma Father   . Diabetes Father   [name] father died at the age of 60 following a stroke.  [name] mother died with cancer of the throat at the age of 28.   She had a history of depression and was a smoker.  Not clear how much alcohol she drank, according to the patient.  The patient had two brothers.  One died from the age of 6 from a "kidney problem."  The second one died at the age of 1 from pancreatic cancer. (This may have  been duodenal cancer. 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  His wife of 37 years is [name].  Unfortunately, they are in the process of divorce. The patient has two daughters:  [name] who is 48, and Bogart who is 79.  They both live here in [place]. 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  The patient is very close to his daughters.  There are no grandchildren.  The patient is  not a church attender.  He does derive a great deal of support from friends at the "[employer]" which he attends very regularly, and also participates in Al-Anon even though actually there is no alcohol or drug problem in the family.  He participates in this group and he gets quite a bit of support from it.     ADVANCED DIRECTIVES:  HEALTH MAINTENANCE: History  Substance Use Topics  . Smoking status: Former Games developer  . Smokeless tobacco: Not on file  . Alcohol Use: No     Colonoscopy:  PSA:  Lipid panel:  Allergies  Allergen Reactions  . Crestor (Rosuvastatin Calcium)   . Lipitor (Atorvastatin Calcium)     Current Outpatient Prescriptions  Medication Sig Dispense Refill  . 5-Hydroxytryptophan (5-HTP PO) Take by mouth daily.        [name] aspirin 81 MG tablet Take 81 mg by mouth daily.        . Cetirizine HCl (ZYRTEC PO) Take by mouth daily.        . diphenhydramine-acetaminophen (TYLENOL PM) 25-500 MG TABS Take 2 tablets by mouth at bedtime as needed.      . ezetimibe (ZETIA) 10 MG tablet Take 1 tablet (10 mg total) by mouth daily.  30 tablet  0  . famciclovir (FAMVIR) 250 MG tablet Take 500 mg by mouth.        . fish oil-omega-3 fatty acids 1000 MG capsule Take by mouth daily.       . fluticasone (FLOVENT HFA) 110 MCG/ACT inhaler Inhale 1 puff into the lungs 2 (two) times daily. fLOVENT 1 PUFF QID.       [name] IRON PO Take by mouth daily.        [name] lenalidomide (REVLIMID) 10 MG capsule 10mg  daily x 21 days / off x 7 days  21 capsule  0  . omeprazole (PRILOSEC) 40 MG capsule Take 1 capsule (40 mg total) by mouth daily.  30 capsule  2  . SELENIUM PO Take by mouth daily.        . sennosides-docusate sodium (SENOKOT-S) 8.6-50 MG tablet Take 1 tablet by mouth daily.      [name] triamcinolone (KENALOG) 0.1 % cream Apply 1 application topically 2 (two) times daily.        [name] DISCONTD: fexofenadine (ALLEGRA) 60 MG tablet Take 180 mg by mouth daily.          OBJECTIVE: Middle-aged white male who appears  fatigued Filed Vitals:   03/19/12 0836  BP: 132/85  Pulse: 75  Temp: 98 F (36.7 C)     Body mass index is 25.96 kg/(m^2).    ECOG FS: 1  Sclerae unicteric; some infraorbital edema, noi erythema Oropharynx clear No peripheral adenopathy Lungs no rales or rhonchi Heart regular rate and rhythm Abd benign MSK no focal spinal tenderness Neuro: nonfocal  LAB RESULTS:  Lab  Results  Component Value Date   WBC 2.8* 03/18/2012   NEUTROABS 1.6 03/18/2012   HGB 12.4* 03/18/2012   HCT 36.8* 03/18/2012   MCV 96.4 03/18/2012   PLT 80* 03/18/2012      Chemistry      Component Value Date/Time   NA 139 03/18/2012 0947   K 3.8 03/18/2012 0947   CL 102 03/18/2012 0947   CO2 25 03/18/2012 0947   BUN 16 03/18/2012 0947   CREATININE 1.16 03/18/2012 0947      Component Value Date/Time   CALCIUM 9.0 03/18/2012 0947   ALKPHOS 74 03/18/2012 0947   AST 80* 03/18/2012 0947   ALT 44 03/18/2012 0947   BILITOT 0.4 03/18/2012 0947       No results found for this basename: LABCA2    No components found with this basename: LABCA125    No results found for this basename: INR:1;PROTIME:1 in the last 168 hours  Urinalysis    Component Value Date/Time   COLORURINE YELLOW 05/02/2010 0957   APPEARANCEUR CLEAR 05/02/2010 0957   LABSPEC 1.024 05/02/2010 0957   PHURINE 5.5 05/02/2010 0957   GLUCOSEU NEGATIVE 05/02/2010 0957   HGBUR NEGATIVE 05/02/2010 0957   BILIRUBINUR NEGATIVE 05/02/2010 0957   KETONESUR 15* 05/02/2010 0957   PROTEINUR NEGATIVE 05/02/2010 0957   UROBILINOGEN 0.2 05/02/2010 0957   NITRITE NEGATIVE 05/02/2010 0957   LEUKOCYTESUR NEGATIVE MICROSCOPIC NOT DONE ON URINES WITH NEGATIVE PROTEIN, BLOOD, LEUKOCYTES, NITRITE, OR GLUCOSE <1000 mg/dL. 05/02/2010 0957       STUDIES: No new results found.  ASSESSMENT: 61 year old [place] man with a history of multiple myeloma diagnosed February of 2012, with an initial M. spike of 2.6 g/dL, IFE showing an IgG lambda paraprotein and free lambda light chains in the  urine. Cytogenetics showed trisomy 67. Bone marrow biopsy showed a 22% plasmacytosis. Treated with  (1) bortezomib subcutaneously, lenalidomide, and dexamethasone, with repeat bone marrow biopsy may of 2012 showing 10% plasmacytosis  (2) high-dose chemotherapy with BCNU and melphalan at [facility], followed by stem cell rescue July of 2012  (3) on monthly zoledronic acid started December of 2012 (4) low-dose lenalidomide resumed April 2013   PLAN:   He resumed the lenalidomide last week, and is tolerating that well. He has the right bowel prophylaxis regimen, if he only uses it. As far as his symptoms, I think he is having mild to moderate sinusitis. We discussed of his taking guaifenesin 2 tablets twice daily, drinking lots of hot liquids, and using an occasional Aleve or Tylenol. His repeat light chains from yesterday are still pending. He will receive zoledronic acid today and every 4 weeks as before. We are going to continue to follow his lab work on an every 4 week basis. He will see me again in October unless there are problems that develop before then. He will continue his lenalidomide at 10 mg daily 3 weeks on one week off  Devinne Epstein C    03/19/2012

## 2012-03-20 ENCOUNTER — Ambulatory Visit: Payer: PRIVATE HEALTH INSURANCE

## 2012-03-20 ENCOUNTER — Other Ambulatory Visit: Payer: PRIVATE HEALTH INSURANCE | Admitting: Lab

## 2012-03-20 ENCOUNTER — Other Ambulatory Visit: Payer: Self-pay | Admitting: Oncology

## 2012-03-20 DIAGNOSIS — C9000 Multiple myeloma not having achieved remission: Secondary | ICD-10-CM

## 2012-03-20 LAB — PROTEIN ELECTROPHORESIS, SERUM
Beta Globulin: 6.2 % (ref 4.7–7.2)
Total Protein, Serum Electrophoresis: 6.3 g/dL (ref 6.0–8.3)

## 2012-03-20 LAB — COMPREHENSIVE METABOLIC PANEL
ALT: 44 U/L (ref 0–53)
Albumin: 4.1 g/dL (ref 3.5–5.2)
CO2: 25 mEq/L (ref 19–32)
Calcium: 9 mg/dL (ref 8.4–10.5)
Chloride: 102 mEq/L (ref 96–112)
Potassium: 3.8 mEq/L (ref 3.5–5.3)
Sodium: 139 mEq/L (ref 135–145)
Total Protein: 6.3 g/dL (ref 6.0–8.3)

## 2012-03-20 LAB — KAPPA/LAMBDA LIGHT CHAINS
Kappa free light chain: 4.69 mg/dL — ABNORMAL HIGH (ref 0.33–1.94)
Lambda Free Lght Chn: 1.98 mg/dL (ref 0.57–2.63)

## 2012-03-21 ENCOUNTER — Ambulatory Visit: Payer: PRIVATE HEALTH INSURANCE

## 2012-03-24 ENCOUNTER — Other Ambulatory Visit: Payer: Self-pay | Admitting: Oncology

## 2012-03-25 ENCOUNTER — Telehealth: Payer: Self-pay | Admitting: *Deleted

## 2012-03-25 ENCOUNTER — Other Ambulatory Visit: Payer: Self-pay | Admitting: *Deleted

## 2012-03-25 MED ORDER — TRAZODONE HCL 50 MG PO TABS: 50.0000 mg | ORAL_TABLET | Freq: Every day | ORAL | Status: DC

## 2012-03-25 NOTE — Telephone Encounter (Signed)
Pt left message stating concern for ongoing fatique, decreased stamina and loss of sleep.  Andre [name] states he awoke last night at midnight and was unable to return to sleep- he finally took 1 mg ativan.  This am his coworkers noticed Andre [name]'s speech is slurred.  Andre [name] states he " feels out of it " but also felt this way yesterday without use of ativan.  He noticed that he cannot walk up the 3 flights of stairs he usually does at work without fatique.  Andre [name] stated several times per lengthy message which required a 2nd message to be completed- " I am just concerned why am I feeling like this?"  Return call number was not given.  This note will be given to MD for review and follow up.

## 2012-03-27 ENCOUNTER — Inpatient Hospital Stay (HOSPITAL_COMMUNITY)
Admission: EM | Admit: 2012-03-27 | Discharge: 2012-03-29 | DRG: 069 | Disposition: A | Discharge status: Home / Self Care | Payer: PRIVATE HEALTH INSURANCE | Attending: Internal Medicine | Admitting: Internal Medicine

## 2012-03-27 ENCOUNTER — Other Ambulatory Visit: Payer: Self-pay | Admitting: Oncology

## 2012-03-27 ENCOUNTER — Telehealth: Payer: Self-pay | Admitting: Oncology

## 2012-03-27 ENCOUNTER — Emergency Department (HOSPITAL_COMMUNITY): Payer: PRIVATE HEALTH INSURANCE

## 2012-03-27 ENCOUNTER — Encounter (HOSPITAL_COMMUNITY): Payer: Self-pay | Admitting: Emergency Medicine

## 2012-03-27 DIAGNOSIS — R21 Rash and other nonspecific skin eruption: Secondary | ICD-10-CM | POA: Diagnosis present

## 2012-03-27 DIAGNOSIS — K222 Esophageal obstruction: Secondary | ICD-10-CM

## 2012-03-27 DIAGNOSIS — G4730 Sleep apnea, unspecified: Secondary | ICD-10-CM

## 2012-03-27 DIAGNOSIS — G459 Transient cerebral ischemic attack, unspecified: Secondary | ICD-10-CM

## 2012-03-27 DIAGNOSIS — K449 Diaphragmatic hernia without obstruction or gangrene: Secondary | ICD-10-CM

## 2012-03-27 DIAGNOSIS — R7401 Elevation of levels of liver transaminase levels: Secondary | ICD-10-CM

## 2012-03-27 DIAGNOSIS — E785 Hyperlipidemia, unspecified: Secondary | ICD-10-CM

## 2012-03-27 DIAGNOSIS — J45909 Unspecified asthma, uncomplicated: Secondary | ICD-10-CM

## 2012-03-27 DIAGNOSIS — H532 Diplopia: Secondary | ICD-10-CM

## 2012-03-27 DIAGNOSIS — C9000 Multiple myeloma not having achieved remission: Secondary | ICD-10-CM

## 2012-03-27 DIAGNOSIS — F329 Major depressive disorder, single episode, unspecified: Secondary | ICD-10-CM

## 2012-03-27 DIAGNOSIS — D649 Anemia, unspecified: Secondary | ICD-10-CM

## 2012-03-27 DIAGNOSIS — E7800 Pure hypercholesterolemia, unspecified: Secondary | ICD-10-CM

## 2012-03-27 DIAGNOSIS — D61818 Other pancytopenia: Secondary | ICD-10-CM | POA: Diagnosis present

## 2012-03-27 DIAGNOSIS — K279 Peptic ulcer, site unspecified, unspecified as acute or chronic, without hemorrhage or perforation: Secondary | ICD-10-CM

## 2012-03-27 HISTORY — DX: Diplopia: H53.2

## 2012-03-27 HISTORY — DX: Transient cerebral ischemic attack, unspecified: G45.9

## 2012-03-27 LAB — CBC WITH DIFFERENTIAL/PLATELET
Basophils Absolute: 0 10*3/uL (ref 0.0–0.1)
Eosinophils Absolute: 0.3 10*3/uL (ref 0.0–0.7)
Lymphs Abs: 0.8 10*3/uL (ref 0.7–4.0)
MCH: 31.5 pg (ref 26.0–34.0)
MCHC: 33.8 g/dL (ref 30.0–36.0)
MCV: 93.1 fL (ref 78.0–100.0)
Monocytes Absolute: 0.4 10*3/uL (ref 0.1–1.0)
Platelets: 60 10*3/uL — ABNORMAL LOW (ref 150–400)
RDW: 16.9 % — ABNORMAL HIGH (ref 11.5–15.5)
WBC: 3.3 10*3/uL — ABNORMAL LOW (ref 4.0–10.5)

## 2012-03-27 LAB — COMPREHENSIVE METABOLIC PANEL
ALT: 32 U/L (ref 0–53)
AST: 43 U/L — ABNORMAL HIGH (ref 0–37)
Albumin: 3.7 g/dL (ref 3.5–5.2)
Alkaline Phosphatase: 80 U/L (ref 39–117)
BUN: 13 mg/dL (ref 6–23)
CO2: 26 mEq/L (ref 19–32)
Glucose, Bld: 81 mg/dL (ref 70–99)
Total Bilirubin: 0.3 mg/dL (ref 0.3–1.2)

## 2012-03-27 LAB — RAPID URINE DRUG SCREEN, HOSP PERFORMED
Barbiturates: NOT DETECTED
Benzodiazepines: NOT DETECTED
Cocaine: NOT DETECTED
Opiates: NOT DETECTED

## 2012-03-27 MED ORDER — LORAZEPAM 0.5 MG PO TABS
0.5000 mg | ORAL_TABLET | Freq: Every day | ORAL | Status: DC
  Administered 2012-03-27 – 2012-03-28 (×2): 0.5 mg via ORAL
  Filled 2012-03-27 (×2): qty 1

## 2012-03-27 MED ORDER — HYDROCORTISONE 1 % EX CREA
TOPICAL_CREAM | Freq: Two times a day (BID) | CUTANEOUS | Status: DC
  Administered 2012-03-27: 1 via TOPICAL
  Administered 2012-03-28 – 2012-03-29 (×3): via TOPICAL
  Filled 2012-03-27: qty 28

## 2012-03-27 MED ORDER — SENNA 8.6 MG PO TABS
2.0000 | ORAL_TABLET | Freq: Every day | ORAL | Status: DC
  Administered 2012-03-27 – 2012-03-28 (×2): 17.2 mg via ORAL
  Filled 2012-03-27 (×2): qty 1
  Filled 2012-03-27: qty 2

## 2012-03-27 MED ORDER — TRAZODONE HCL 50 MG PO TABS
50.0000 mg | ORAL_TABLET | Freq: Every day | ORAL | Status: DC
  Administered 2012-03-27 – 2012-03-28 (×2): 50 mg via ORAL
  Filled 2012-03-27 (×4): qty 1

## 2012-03-27 MED ORDER — GADOBENATE DIMEGLUMINE 529 MG/ML IV SOLN
17.0000 mL | Freq: Once | INTRAVENOUS | Status: AC | PRN
  Administered 2012-03-27: 17 mL via INTRAVENOUS

## 2012-03-27 MED ORDER — GUAIFENESIN ER 600 MG PO TB12
1200.0000 mg | ORAL_TABLET | Freq: Two times a day (BID) | ORAL | Status: DC
  Administered 2012-03-27 – 2012-03-29 (×4): 1200 mg via ORAL
  Filled 2012-03-27 (×5): qty 2

## 2012-03-27 MED ORDER — BISACODYL 5 MG PO TBEC: 10.0000 mg | DELAYED_RELEASE_TABLET | Freq: Every day | ORAL | Status: DC | PRN

## 2012-03-27 MED ORDER — SODIUM CHLORIDE 0.9 % IV SOLN
INTRAVENOUS | Status: DC
  Administered 2012-03-27 – 2012-03-29 (×5): via INTRAVENOUS

## 2012-03-27 MED ORDER — FLUCONAZOLE 100 MG PO TABS
100.0000 mg | ORAL_TABLET | Freq: Every day | ORAL | Status: DC
  Administered 2012-03-28 – 2012-03-29 (×2): 100 mg via ORAL
  Filled 2012-03-27 (×2): qty 1

## 2012-03-27 MED ORDER — LORATADINE 10 MG PO TABS
10.0000 mg | ORAL_TABLET | Freq: Every day | ORAL | Status: DC
  Administered 2012-03-28 – 2012-03-29 (×2): 10 mg via ORAL
  Filled 2012-03-27 (×2): qty 1

## 2012-03-27 MED ORDER — EZETIMIBE 10 MG PO TABS
10.0000 mg | ORAL_TABLET | Freq: Every day | ORAL | Status: DC
  Administered 2012-03-28 – 2012-03-29 (×2): 10 mg via ORAL
  Filled 2012-03-27 (×2): qty 1

## 2012-03-27 MED ORDER — PANTOPRAZOLE SODIUM 40 MG PO TBEC
40.0000 mg | DELAYED_RELEASE_TABLET | Freq: Every day | ORAL | Status: DC
  Administered 2012-03-28 – 2012-03-29 (×2): 40 mg via ORAL
  Filled 2012-03-27 (×2): qty 1

## 2012-03-27 MED ORDER — ACETAMINOPHEN 325 MG PO TABS
650.0000 mg | ORAL_TABLET | ORAL | Status: DC | PRN
  Administered 2012-03-29: 650 mg via ORAL
  Filled 2012-03-27: qty 2

## 2012-03-27 MED ORDER — FLUTICASONE PROPIONATE 50 MCG/ACT NA SUSP
1.0000 | Freq: Every day | NASAL | Status: DC
  Filled 2012-03-27: qty 16

## 2012-03-27 NOTE — ED Provider Notes (Signed)
I have personally seen and examined the patient.  I have discussed the plan of care with the resident.  I have reviewed the documentation on PMH/FH/Soc. History.  I have reviewed the documentation of the resident and agree.  Doug Sou, MD 03/27/12 [phone]

## 2012-03-27 NOTE — ED Provider Notes (Signed)
History     CSN: [id number]  Arrival date & time 03/27/12  1008   First MD Initiated Contact with Patient 03/27/12 1044      Chief Complaint  Patient presents with  . Dizziness  . Blurred Vision  . Aphasia    (Consider location/radiation/quality/duration/timing/severity/associated sxs/prior treatment) HPI 61 year old man with a history of multiple myeloma s/p bone marrow transplant who presents with new onset diplopia that occurred this morning. It last ten minutes and then resolved spontaneously. This is the first occurrence of diplopia, but he feels that his vision is  worsening gradually with intermittent blurriness. This is also accompanied by generalized decrease in energy, but he still exercises daily. Additionally, he notes indisidious onset memory loss and word finding deficit. However, he denies dysarthria, aphassia and dysphagia. He also has a new onset rash on his trunk and lower extremities bilaterally. He says the he has had this rash in the past and told it was "yeast". However, he believes that this is worse than previous occurrences.    Past Medical History  Diagnosis Date  . Hypertension   . Hyperlipidemia   . Duodenal ulcer   . Multiple myeloma 07/04/2011    Past Surgical History  Procedure Date  . Cardiolite study 11/25/2003    NORMAL    Family History  Problem Relation Age of Onset  . Cancer Mother   . Hypertension Father   . Stroke Father   . Asthma Father   . Diabetes Father     History  Substance Use Topics  . Smoking status: Former Games developer  . Smokeless tobacco: Not on file  . Alcohol Use: No      Review of Systems  Constitutional: Positive for fatigue. Negative for fever and chills.  HENT: Positive for hearing loss, congestion and sneezing.   Eyes: Positive for visual disturbance.  Respiratory: Negative.   Cardiovascular: Negative.   Gastrointestinal: Negative.   Genitourinary: Negative.   Musculoskeletal: Negative.   Neurological:  Positive for dizziness, weakness and headaches.  Hematological: Negative.     Allergies  Crestor and Lipitor  Home Medications   Current Outpatient Rx  Name Route Sig Dispense Refill  . ASPIRIN 81 MG PO TABS Oral Take 81 mg by mouth daily.      [name] ZYRTEC PO Oral Take by mouth daily.      [name] EZETIMIBE 10 MG PO TABS Oral Take 1 tablet (10 mg total) by mouth daily. 30 tablet 0    Patient needs to call for fasting lab work and app ...  . FAMCICLOVIR 250 MG PO TABS Oral Take 250 mg by mouth daily.     . OMEGA-3 FATTY ACIDS 1000 MG PO CAPS Oral Take 2 g by mouth daily.     [name] FLUCONAZOLE 100 MG PO TABS Oral Take 100 mg by mouth daily.    . GUAIFENESIN ER 600 MG PO TB12 Oral Take 1,200 mg by mouth 2 (two) times daily.    [name] LORAZEPAM 0.5 MG PO TABS Oral Take 0.5 mg by mouth at bedtime.    . OMEPRAZOLE 40 MG PO CPDR Oral Take 1 capsule (40 mg total) by mouth daily. 30 capsule 2  . TRAZODONE HCL 50 MG PO TABS Oral Take 1 tablet (50 mg total) by mouth at bedtime. 30 tablet 3  . ZOLEDRONIC ACID 4 MG/5ML IV CONC Intravenous Inject 4 mg into the vein once.      BP 119/80  Pulse 51  Temp 98.5 F (  36.9 C) (Oral)  Resp 9  SpO2 97%  Physical Exam  Constitutional: Vital signs are normal. He appears ill. No distress.  HENT:  Head: Normocephalic and atraumatic.  Right Ear: External ear normal.  Left Ear: External ear normal.  Mouth/Throat: Oropharynx is clear and moist.  Eyes: Conjunctivae and EOM are normal. Pupils are equal, round, and reactive to light.  Neck: Normal range of motion. Neck supple.  Cardiovascular: Normal rate, regular rhythm and normal heart sounds.   Pulmonary/Chest: Effort normal and breath sounds normal.  Abdominal: Soft. Bowel sounds are normal.  Neurological: He is alert. He has normal strength and normal reflexes. He is not disoriented. No cranial nerve deficit or sensory deficit. He displays a negative Romberg sign.  Skin: Petechiae and rash noted.       Petechiael  rash on trunk and lower extremities bilaterally  Psychiatric: His affect is blunt. He is slowed.    ED Course  Procedures (including critical care time)  Labs Reviewed  COMPREHENSIVE METABOLIC PANEL - Abnormal; Notable for the following:    AST 43 (*)     GFR calc non Af Amer 66 (*)     GFR calc Af Amer 77 (*)     All other components within normal limits  CBC WITH DIFFERENTIAL - Abnormal; Notable for the following:    WBC 3.3 (*)     RBC 3.78 (*)     Hemoglobin 11.9 (*)     HCT 35.2 (*)     RDW 16.9 (*)     Platelets 60 (*)     Monocytes Relative 13 (*)     Eosinophils Relative 9 (*)     All other components within normal limits   [name] Wo Contrast  03/27/2012  *RADIOLOGY REPORT*  Clinical Data: 61 year old male with multiple myeloma.  Blurred vision.  MRI HEAD WITHOUT AND WITH CONTRAST  Technique:  Multiplanar, multiecho pulse sequences of the brain and surrounding structures were obtained according to standard protocol without and with intravenous contrast  Contrast: 17mL MULTIHANCE GADOBENATE DIMEGLUMINE 529 MG/ML IV SOLN  Comparison: None.  Findings: Bone marrow signal is heterogeneous but overall appears to be within normal limits throughout the visualized skull and upper cervical spine.  No restricted diffusion to suggest acute infarction.  No midline shift, mass effect, evidence of mass lesion, ventriculomegaly, extra-axial collection or acute intracranial hemorrhage. Cervicomedullary junction and pituitary are within normal limits. Major intracranial vascular flow voids are preserved.  Mild for age cerebral white matter T2 and FLAIR hyperintensity, mostly periventricular.  No abnormal enhancement of the brain.  No abnormal dural thickening or enhancement identified.  Negative visualized cervical spine.  Visualized orbit soft tissues are within normal limits.  Optic chiasm is within normal limits. Mild ethmoid sinus mucosal thickening.  Other Visualized paranasal sinuses and  mastoids are clear.  Negative scalp soft tissues.  IMPRESSION: 1. No acute or metastatic intracranial abnormality. 2.  Mildly heterogeneous but otherwise essentially normal skull bone marrow signal without strong evidence of active myelomatous involvement.  Original Report Authenticated By: Harley Hallmark, M.D.     1. TIA (transient ischemic attack)       MDM  Given the patient's history of neurologic symptoms with resolution prior to exam, there is concern for vascular issues like a TIA. Given his multiple myeloma, there is also risk for intracranial pathology. Therefore, an MRI of the brain was ordered to r/o metastases or lesions. [name] oncologist Dr. Ruthann Cancer was  consulted and agreed with this plan. Given the negative MRI, he will be admitted for a TIA work-up.         Garnetta Buddy, MD 03/27/12 [phone]

## 2012-03-27 NOTE — Telephone Encounter (Signed)
S/w the pt and he is aware of his appts on 04/16/2012

## 2012-03-27 NOTE — ED Provider Notes (Signed)
Complained of generalized weakness for one month andSlow speech for one month. Patient felt improved this morning he presents today as he developed diplopia lasting for less than 5 minutes this morning  Doug Sou, MD 03/27/12 1644

## 2012-03-27 NOTE — ED Notes (Signed)
States "something isn't right. Speech is slow, can't put thoughts together, increased fatigue," did swim this morning,

## 2012-03-27 NOTE — H&P (Signed)
[facility] History and Physical  Andre [name] ZOX:[id number] DOB: [birth year] DOA: 03/27/2012  Referring physician: Dr. Arvid Right PCP: Renee Harder, MD   Chief Complaint: Transient diplopia and dizziness  HPI:  The patient is a 61 year old white male with past medical history significant for multiple myeloma, hypertension, hyperlipidemia who arm presents with above complaints. He states that the arm today he had double vision which lasted for a couple of minutes as well as dizziness and then spontaneously resolved. He states that he has had slurred speech for the past few days -and reports that he first noted it after taking Ativan the night of prior, but even after his dose of Ativan was decreased he he still has some slurred speech per family.He denies blurry vision, focal weakness, dysphagia. He reports that he has had nasal congestion, and 'sinus 'headache' but no rhinorrhea or sneezing, or postnasal drip. He denies fevers, cough, shortness of breath, chest pain and no vomiting. He reports that he has been feeling fatigued and had nausea for the past month, and Dr. Darnelle Catalan  asked him to stop his Revelmid about 3 days ago. He was seen in the ED and is an MRI of his brain was done and showed no acute intracranial findings. Lab work revealed a platelet count of 60. He is admitted for further evaluation and management. 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 Bocock admits to a rash that he first developed several days ago-was started initially from his groin area bilaterally and spread to his lower extremities and and then up to his trunk and upper extremities. She does not report any pruritus. He states that he's had a similar rash sometime in the past but it resolved.  Review of Systems:  The patient denies  anorexia, fever, weight loss,, vision loss, decreased hearing, hoarseness, chest pain, syncope, dyspnea on exertion, peripheral edema, balance deficits, hemoptysis, abdominal pain, melena, hematochezia, severe indigestion/heartburn, hematuria, incontinence, genital sores, muscle weakness, transient blindness, difficulty walking, depression, unusual weight change, abnormal bleeding.   Past Medical History  Diagnosis Date  . Hypertension   . Hyperlipidemia   . Duodenal ulcer   . Multiple myeloma 07/04/2011   Past Surgical History  Procedure Date  . Cardiolite study 11/25/2003    NORMAL   Social History:  reports that he quit smoking about 43 years ago. He has never used smokeless tobacco. He reports that he does not drink alcohol or use illicit drugs.  Allergies  Allergen Reactions  . Crestor (Rosuvastatin Calcium)   . Lipitor (Atorvastatin Calcium)     Family History  Problem Relation Age of Onset  . Cancer Mother   . Hypertension Father   . Stroke Father   . Asthma Father   . Diabetes Father     Prior to Admission medications   Medication Sig Start Date End Date Taking? Authorizing Provider  aspirin 81 MG tablet Take 81 mg by mouth daily.     Yes Historical Provider, MD  Cetirizine HCl (ZYRTEC PO) Take by mouth daily.     Yes Historical Provider, MD  ezetimibe (ZETIA) 10 MG tablet Take 1 tablet (10 mg total) by mouth daily. 03/13/12 03/13/13 Yes Cassell Clement, MD  famciclovir (FAMVIR) 250 MG tablet Take 250 mg by mouth daily.  Yes Historical Provider, MD  fish oil-omega-3 fatty acids 1000 MG capsule Take 2 g by mouth daily.    Yes Historical Provider, MD  fluconazole (DIFLUCAN) 100 MG tablet Take 100 mg by mouth daily. 03/24/12 04/03/12 Yes Historical Provider, MD  guaiFENesin (MUCINEX) 600 MG 12 hr tablet Take 1,200 mg by mouth 2 (two) times daily.   Yes Historical Provider, MD  LORazepam (ATIVAN) 0.5 MG tablet Take 0.5 mg by mouth at bedtime.   Yes Historical Provider, MD    omeprazole (PRILOSEC) 40 MG capsule Take 1 capsule (40 mg total) by mouth daily. 02/08/12  Yes Lowella Dell, MD  traZODone (DESYREL) 50 MG tablet Take 1 tablet (50 mg total) by mouth at bedtime. 03/25/12 04/24/12 Yes Lowella Dell, MD  zolendronic acid (ZOMETA) 4 MG/5ML injection Inject 4 mg into the vein once.   Yes Historical Provider, MD   Physical Exam: Filed Vitals:   03/27/12 1600 03/27/12 1630 03/27/12 1700 03/27/12 1756  BP: 120/71 131/93 134/89 139/89  Pulse: 48 57 53 56  Temp:    97.7 F (36.5 C)  TempSrc:    Oral  Resp:    18  Height:    5\' 9"  (1.753 m)  Weight:    83.8 kg (184 lb 11.9 oz)  SpO2: 96% 97% 97% 98%    Constitutional: Vital signs reviewed.  Patient is a well-developed and well-nourished  in no acute distress and cooperative with exam. Alert and oriented x3.  Head: Normocephalic and atraumatic Mouth: no erythema or exudates, slightly dry MM Eyes: PERRL, EOMI, conjunctivae normal, No scleral icterus.  Neck: Supple, Trachea midline normal ROM, No JVD, mass, thyromegaly, or carotid bruit present.  Cardiovascular: RRR, S1 normal, S2 normal, no MRG, pulses symmetric and intact bilaterally Pulmonary/Chest: CTAB, no wheezes, rales, or rhonchi Abdominal: Soft. Non-tender, non-distended, bowel sounds are normal, no masses, organomegaly, or guarding present.  Extremities: No cyanosis and no edema  Neurological: A&O x3, Strength is normal and symmetric bilaterally, cranial nerve II-XII are grossly intact, no focal motor deficit, sensory intact to light touch bilaterally. No facial asymmetry. Right plantar reflex downgoing, left  plantar reflex equivocal. Skin: Warm, dry and intact. He has a nonblanching papular/petechial appearing rash diffusely on his lower extremities, on the flexor surfaces of his upper extremities, trunk -none on his face.  Psychiatric:  speech and behavior is normal. Judgment and thought content normal.    Labs on Admission:  Basic Metabolic  Panel:  Lab 03/27/12 1225  NA 136  K 3.8  CL 101  CO2 26  GLUCOSE 81  BUN 13  CREATININE 1.15  CALCIUM 9.2  MG --  PHOS --   Liver Function Tests:  Lab 03/27/12 1225  AST 43*  ALT 32  ALKPHOS 80  BILITOT 0.3  PROT 6.7  ALBUMIN 3.7   No results found for this basename: LIPASE:5,AMYLASE:5 in the last 168 hours No results found for this basename: AMMONIA:5 in the last 168 hours CBC:  Lab 03/27/12 1225  WBC 3.3*  NEUTROABS 1.8  HGB 11.9*  HCT 35.2*  MCV 93.1  PLT 60*   Cardiac Enzymes: No results found for this basename: CKTOTAL:5,CKMB:5,CKMBINDEX:5,TROPONINI:5 in the last 168 hours BNP: No components found with this basename: POCBNP:5 CBG: No results found for this basename: GLUCAP:5 in the last 168 hours  Radiological Exams on Admission: Mr Armon Orvis Wo Contrast  03/27/2012  *RADIOLOGY REPORT*  Clinical Data: 61 year old male with multiple myeloma.  Blurred vision.  MRI HEAD  WITHOUT AND WITH CONTRAST  Technique:  Multiplanar, multiecho pulse sequences of the brain and surrounding structures were obtained according to standard protocol without and with intravenous contrast  Contrast: 17mL MULTIHANCE GADOBENATE DIMEGLUMINE 529 MG/ML IV SOLN  Comparison: None.  Findings: Bone marrow signal is heterogeneous but overall appears to be within normal limits throughout the visualized skull and upper cervical spine.  No restricted diffusion to suggest acute infarction.  No midline shift, mass effect, evidence of mass lesion, ventriculomegaly, extra-axial collection or acute intracranial hemorrhage. Cervicomedullary junction and pituitary are within normal limits. Major intracranial vascular flow voids are preserved.  Mild for age cerebral white matter T2 and FLAIR hyperintensity, mostly periventricular.  No abnormal enhancement of the brain.  No abnormal dural thickening or enhancement identified.  Negative visualized cervical spine.  Visualized orbit soft tissues are within normal  limits.  Optic chiasm is within normal limits. Mild ethmoid sinus mucosal thickening.  Other Visualized paranasal sinuses and mastoids are clear.  Negative scalp soft tissues.  IMPRESSION: 1. No acute or metastatic intracranial abnormality. 2.  Mildly heterogeneous but otherwise essentially normal skull bone marrow signal without strong evidence of active myelomatous involvement.  Original Report Authenticated By: Harley Hallmark, M.D.    EKG: Normal sinus rhythm with no acute ischemic changes  Assessment/Plan Active Problems:  Probable TIA (transient ischemic attack)/ Transient diplopia -As discussed above, MRI negative for CVA, carotid Dopplers and echo pending. -Will hold off aspirin at this time due to his thrombocytopenia -Have consulted neurology for further recommendations  Multiple myeloma -Patient is followed by Dr. Darnelle Catalan and states that his Revlimid was stopped 3 days ago Pancytopenia/thrombocytopenia -Will hold off aspirin and Famvir for now, follow and recheck -He has no gross bleeding at this time, will follow -Have consulted Dr. Darnelle Catalan and for further recommendations  Rash/?petechial -Follow and recheck platelet counts, dermatology referal inpatient versus outpatient    Code Status: full code  Kela Millin [facility] Pager [phone] TIME spent- If 7PM-7AM, please contact night-coverage [email or web address] Password [facility] 03/27/2012, 6:52 PM

## 2012-03-27 NOTE — ED Notes (Signed)
Resident at bedside.  

## 2012-03-27 NOTE — ED Notes (Signed)
Double vision, slow speaking, weakness, cannot fully smile x 2 days. Hx mult myeloma. States he took 1 mg ativan Monday night, felt "slow" after. Denies taking full dose since, symptoms still present. Rash present on legs, upper thighs, bilat x5 days

## 2012-03-28 ENCOUNTER — Other Ambulatory Visit: Payer: PRIVATE HEALTH INSURANCE

## 2012-03-28 ENCOUNTER — Inpatient Hospital Stay (HOSPITAL_COMMUNITY): Payer: PRIVATE HEALTH INSURANCE

## 2012-03-28 DIAGNOSIS — I059 Rheumatic mitral valve disease, unspecified: Secondary | ICD-10-CM

## 2012-03-28 LAB — CBC
HCT: 35.6 % — ABNORMAL LOW (ref 39.0–52.0)
Hemoglobin: 12 g/dL — ABNORMAL LOW (ref 13.0–17.0)
MCH: 31.9 pg (ref 26.0–34.0)
MCHC: 33.7 g/dL (ref 30.0–36.0)
MCV: 94.7 fL (ref 78.0–100.0)
RDW: 17 % — ABNORMAL HIGH (ref 11.5–15.5)

## 2012-03-28 LAB — GLUCOSE, CAPILLARY
Glucose-Capillary: 83 mg/dL (ref 70–99)
Glucose-Capillary: 79 mg/dL (ref 70–99)

## 2012-03-28 LAB — HEMOGLOBIN A1C: Mean Plasma Glucose: 108 mg/dL (ref <117)

## 2012-03-28 LAB — LIPID PANEL
Cholesterol: 147 mg/dL (ref 0–200)
Total CHOL/HDL Ratio: 2.9 RATIO
VLDL: 12 mg/dL (ref 0–40)

## 2012-03-28 MED ORDER — GADOBENATE DIMEGLUMINE 529 MG/ML IV SOLN
17.0000 mL | Freq: Once | INTRAVENOUS | Status: AC | PRN
Start: 2012-03-28 — End: 2012-03-28
  Administered 2012-03-28: 17 mL via INTRAVENOUS

## 2012-03-28 NOTE — Progress Notes (Signed)
   CARE MANAGEMENT NOTE 03/28/2012  Patient:  Andre [name], Andre [name]   Account Number:  [id number]  Date Initiated:  03/28/2012  Documentation initiated by:  Jiles Crocker  Subjective/Objective Assessment:   ADMITTED WITH Probable TIA (transient ischemic attack)/ Transient diplopia; Multiple myeloma     Action/Plan:   PCP: Renee Harder, MD  LIVES AT HOME ALONE   Anticipated DC Date:  04/04/2012   Anticipated DC Plan:  HOME W HOME HEALTH SERVICES      DC Planning Services  CM consult               Status of service:  In process, will continue to follow Medicare Important Message given?  NA - LOS <3 / Initial given by admissions (If response is "NO", the following Medicare IM given date fields will be blank)  Per UR Regulation:  Reviewed for med. necessity/level of care/duration of stay  Comments:  7/11/2013[facility] RN, BSN, MHA

## 2012-03-28 NOTE — Progress Notes (Signed)
VASCULAR LAB PRELIMINARY  PRELIMINARY  PRELIMINARY  PRELIMINARY  Carotid duplex  completed.    Preliminary report:  Bilateral:  No evidence of hemodynamically significant internal carotid artery stenosis.   Vertebral artery flow is antegrade.      Katerina Zurn, RVT 03/28/2012, 10:20 AM

## 2012-03-28 NOTE — Progress Notes (Signed)
[facility] PROGRESS NOTE  Andre [name] ZOX:[id number] DOB: 1950/11/25 DOA: 03/27/2012 PCP: Renee Harder, MD  Assessment/Plan: Active Problems:  TIA (transient ischemic attack)  Transient diplopia  Multiple myeloma  Rash  Pancytopenia  Probable TIA (transient ischemic attack)/ Transient diplopia  -As discussed above, MRI negative for CVA, carotid Dopplers and echo pending.  -holding off aspirin at this time due to his thrombocytopenia  -appreciate neurology input- will obtain MRA of head and neck -carotid doppler neg for stenosis,f/u echo Multiple myeloma  -Patient is followed by Dr. Darnelle Catalan and states that his Revlimid was stopped 3 days ago  -await onc eval. Pancytopenia/thrombocytopenia  -holding off aspirin and Famvir for now, plt ct beginning to trend up, follow  -He has no gross bleeding at this time, will follow  -await Dr. Darnelle Catalan and for further recommendations  Rash/?petechial  -pt was seen by Dr Donzetta Starch in past, called his office, but he is out of town till Monday 7/15   Brief narrative: Pt is a 61 yo with multiple myeloma admitted with tansient diplopia and dizziness. MRI in ED neg for acute intracranial findings. Pt also with pancytopenia/>thrombocytopenia, and has rash.   Consultants:  Neuro  onc  Antibiotics:  NONE  HPI/Subjective: Today pt states he feels much better, no further dizziness or diplopia. Reports pruritis with rash.  Objective: Filed Vitals:   03/28/12 0632 03/28/12 1339 03/28/12 1847 03/28/12 2250  BP: 131/83 127/79 160/84 143/81  Pulse: 64 50 48 46  Temp:  98.3 F (36.8 C) 97.5 F (36.4 C) 97.4 F (36.3 C)  TempSrc:  Oral Oral Oral  Resp: 20 18 16 16   Height:      Weight:      SpO2: 99% 96% 98% 95%    Intake/Output Summary (Last 24 hours) at 03/28/12 2312 Last data filed at 03/28/12 1900  Gross per 24 hour  Intake 3191.67 ml  Output    650 ml  Net 2541.67 ml    Exam: Eyes: PERRL, EOMI, conjunctivae  normal, No scleral icterus.  Neck: Supple, Trachea midline normal ROM, No JVD, mass, thyromegaly, or carotid bruit present.  Cardiovascular: RRR, S1 normal, S2 normal, no MRG, pulses symmetric and intact bilaterally  Pulmonary/Chest: CTAB, no wheezes, rales, or rhonchi  Abdominal: Soft. Non-tender, non-distended, bowel sounds are normal, no masses, organomegaly, or guarding present.  Extremities: No cyanosis and no edema  Skin: Warm, dry and intact. Unchanged nonblanching papular/petechial appearing rash diffusely on his lower extremities, on the flexor surfaces of his upper extremities, trunk -none on his face   Data Reviewed: Basic Metabolic Panel:  Lab 03/27/12 8119  NA 136  K 3.8  CL 101  CO2 26  GLUCOSE 81  BUN 13  CREATININE 1.15  CALCIUM 9.2  MG --  PHOS --   Liver Function Tests:  Lab 03/27/12 1225  AST 43*  ALT 32  ALKPHOS 80  BILITOT 0.3  PROT 6.7  ALBUMIN 3.7   No results found for this basename: LIPASE:5,AMYLASE:5 in the last 168 hours No results found for this basename: AMMONIA:5 in the last 168 hours CBC:  Lab 03/28/12 0348 03/27/12 1225  WBC 2.7* 3.3*  NEUTROABS -- 1.8  HGB 12.0* 11.9*  HCT 35.6* 35.2*  MCV 94.7 93.1  PLT 64* 60*   Cardiac Enzymes: No results found for this basename: CKTOTAL:5,CKMB:5,CKMBINDEX:5,TROPONINI:5 in the last 168 hours BNP (last 3 results) No results found for this basename: PROBNP:3 in the last 8760 hours CBG:  Lab 03/28/12 2248 03/28/12  1651 03/28/12 1148 03/28/12 0818 03/27/12 2202  GLUCAP 79 86 83 69* 94    No results found for this or any previous visit (from the past 240 hour(s)).   Studies:  [name] ZO Contrast  03/27/2012  *RADIOLOGY REPORT*  Clinical Data: 61 year old male with multiple myeloma.  Blurred vision.  MRI HEAD WITHOUT AND WITH CONTRAST  Technique:  Multiplanar, multiecho pulse sequences of the brain and surrounding structures were obtained according to standard protocol without and with  intravenous contrast  Contrast: 17mL MULTIHANCE GADOBENATE DIMEGLUMINE 529 MG/ML IV SOLN  Comparison: None.  Findings: Bone marrow signal is heterogeneous but overall appears to be within normal limits throughout the visualized skull and upper cervical spine.  No restricted diffusion to suggest acute infarction.  No midline shift, mass effect, evidence of mass lesion, ventriculomegaly, extra-axial collection or acute intracranial hemorrhage. Cervicomedullary junction and pituitary are within normal limits. Major intracranial vascular flow voids are preserved.  Mild for age cerebral white matter T2 and FLAIR hyperintensity, mostly periventricular.  No abnormal enhancement of the brain.  No abnormal dural thickening or enhancement identified.  Negative visualized cervical spine.  Visualized orbit soft tissues are within normal limits.  Optic chiasm is within normal limits. Mild ethmoid sinus mucosal thickening.  Other Visualized paranasal sinuses and mastoids are clear.  Negative scalp soft tissues.  IMPRESSION: 1. No acute or metastatic intracranial abnormality. 2.  Mildly heterogeneous but otherwise essentially normal skull bone marrow signal without strong evidence of active myelomatous involvement.  Original Report Authenticated By: Harley Hallmark, M.D.    Scheduled Meds:   . ezetimibe  10 mg Oral Daily  . fluconazole  100 mg Oral Daily  . fluticasone  1 spray Each Nare Daily  . guaiFENesin  1,200 mg Oral BID  . hydrocortisone cream   Topical BID  . loratadine  10 mg Oral Daily  . LORazepam  0.5 mg Oral QHS  . pantoprazole  40 mg Oral Q1200  . senna  2 tablet Oral QHS  . traZODone  50 mg Oral QHS   Continuous Infusions:   . sodium chloride 100 mL/hr at 03/28/12 2104          Kela Millin [facility] Pager [phone]  If 7PM-7AM, please contact night-coverage [email or web address] Password [facility] 03/28/2012, 12:46 PM

## 2012-03-28 NOTE — Consult Note (Signed)
[facility] CONSULT NOTE     Reason for Consult: Transient diplopia.  CC: Transient diplopia.    HPI:    Andre [name] is an 61 y.o. male who presents for evaluation of transient diplopia with presyncopal sensation. He has a history of multiple myeloma and is treated with chemotherapy. On the day of admission, the patient experienced double vision for 1-2 minutes in conjunction with a feeling of faintness. An associate of his felt that his speech was also slightly slurred at that time. He states he went to the bathroom to urinate and the symptoms resolved; also looked in the mirror and his face and eyes appeared normal. He was feeling generally unwell at that time and states that a sense of general malaise has been going on for about one month. He feels that this may be related to his chemotherapy. He states that his speech has been slurred for the past few days, initially noticing this after taking Ativan. The patient denies blurred vision, eye pain, head pain, facial weakness, facial numbness, limb weakness or limb numbness. The patient endorses nasal congestion and sinus discomfort for about the last month.   I have reviewed the MRI scan and agree with the radiologist's interpretation. No acute or metastatic intracranial abnormality seen. There is no finding to explain the patient's transient diplopia.    Past Medical History  Diagnosis Date  . Hypertension   . Hyperlipidemia   . Duodenal ulcer   . Multiple myeloma 07/04/2011    Past Surgical History  Procedure Date  . Cardiolite study 11/25/2003    NORMAL    Family History  Problem Relation Age of Onset  . Cancer Mother   . Hypertension Father   . Stroke Father   . Asthma Father   . Diabetes Father     Social History:  reports that he quit smoking about 43 years ago. He has never used smokeless tobacco. He reports that he does not drink alcohol or use illicit drugs.  Allergies  Allergen  Reactions  . Crestor (Rosuvastatin Calcium)   . Lipitor (Atorvastatin Calcium)     Medications:    Scheduled:   . ezetimibe  10 mg Oral Daily  . fluconazole  100 mg Oral Daily  . fluticasone  1 spray Each Nare Daily  . guaiFENesin  1,200 mg Oral BID  . hydrocortisone cream   Topical BID  . loratadine  10 mg Oral Daily  . LORazepam  0.5 mg Oral QHS  . pantoprazole  40 mg Oral Q1200  . senna  2 tablet Oral QHS  . traZODone  50 mg Oral QHS    Review of Systems - No fevers, cough, SOB, vomiting, chest pain. Positive for sinus discomfort and fatigue.   Blood pressure 131/83, pulse 64, temperature 97.3 F (36.3 C), temperature source Oral, resp. rate 20, height 5\' 9"  (1.753 m), weight 83.8 kg (184 lb 11.9 oz), SpO2 99.00%.   Neurologic Examination:  Ment: Intact to complex questions and commands.  CN: PERRL, visual fields intact, EOMI without nystagmus or elicited diplopia. Eyes conjugate. Palpebral fissures equal. VII equal bilaterally. Hearing intact to conversation. Facial sensation normal in V1-3 distribution bilaterally. Tongue midline. Palate elevates normally. No hypophonia or hoarseness.  Motor: 5/5 in all 4 extremities.  Sensory: Intact to fine touch and temperature x 4. No extinction.  Reflexes: 2+ and symmetric  throughout with downgoing toes.  Cerebellar: No ataxia on FNF.  Gait: Deferred.    Lab Results  Component Value Date/Time   CHOL 147 03/28/2012  3:48 AM    Results for orders placed during the hospital encounter of 03/27/12 (from the past 48 hour(s))  COMPREHENSIVE METABOLIC PANEL     Status: Abnormal   Collection Time   03/27/12 12:25 PM      Component Value Range Comment   Sodium 136  135 - 145 mEq/L    Potassium 3.8  3.5 - 5.1 mEq/L    Chloride 101  96 - 112 mEq/L    CO2 26  19 - 32 mEq/L    Glucose, Bld 81  70 - 99 mg/dL    BUN 13  6 - 23 mg/dL    Creatinine, Ser 8.11  0.50 - 1.35 mg/dL    Calcium 9.2  8.4 - 91.4 mg/dL    Total Protein 6.7  6.0 -  8.3 g/dL    Albumin 3.7  3.5 - 5.2 g/dL    AST 43 (*) 0 - 37 U/L    ALT 32  0 - 53 U/L    Alkaline Phosphatase 80  39 - 117 U/L    Total Bilirubin 0.3  0.3 - 1.2 mg/dL    GFR calc non Af Amer 66 (*) >90 mL/min    GFR calc Af Amer 77 (*) >90 mL/min   CBC WITH DIFFERENTIAL     Status: Abnormal   Collection Time   03/27/12 12:25 PM      Component Value Range Comment   WBC 3.3 (*) 4.0 - 10.5 K/uL    RBC 3.78 (*) 4.22 - 5.81 MIL/uL    Hemoglobin 11.9 (*) 13.0 - 17.0 g/dL    HCT 78.2 (*) 95.6 - 52.0 %    MCV 93.1  78.0 - 100.0 fL    MCH 31.5  26.0 - 34.0 pg    MCHC 33.8  30.0 - 36.0 g/dL    RDW 21.3 (*) 08.6 - 15.5 %    Platelets 60 (*) 150 - 400 K/uL    Neutrophils Relative 55  43 - 77 %    Lymphocytes Relative 23  12 - 46 %    Monocytes Relative 13 (*) 3 - 12 %    Eosinophils Relative 9 (*) 0 - 5 %    Basophils Relative 0  0 - 1 %    Neutro Abs 1.8  1.7 - 7.7 K/uL    Lymphs Abs 0.8  0.7 - 4.0 K/uL    Monocytes Absolute 0.4  0.1 - 1.0 K/uL    Eosinophils Absolute 0.3  0.0 - 0.7 K/uL    Basophils Absolute 0.0  0.0 - 0.1 K/uL   HEMOGLOBIN A1C     Status: Normal   Collection Time   03/27/12 12:25 PM      Component Value Range Comment   Hemoglobin A1C 5.4  <5.7 %    Mean Plasma Glucose 108  <117 mg/dL   URINE RAPID DRUG SCREEN (HOSP PERFORMED)     Status: Normal   Collection Time   03/27/12  9:27 PM      Component Value Range Comment   Opiates NONE DETECTED  NONE DETECTED    Cocaine NONE DETECTED  NONE DETECTED    Benzodiazepines NONE DETECTED  NONE DETECTED    Amphetamines NONE DETECTED  NONE DETECTED    Tetrahydrocannabinol NONE DETECTED  NONE DETECTED    Barbiturates NONE  DETECTED  NONE DETECTED   GLUCOSE, CAPILLARY     Status: Normal   Collection Time   03/27/12 10:02 PM      Component Value Range Comment   Glucose-Capillary 94  70 - 99 mg/dL   LIPID PANEL     Status: Normal   Collection Time   03/28/12  3:48 AM      Component Value Range Comment   Cholesterol 147  0 -  200 mg/dL    Triglycerides 60  <098 mg/dL    HDL 51  >11 mg/dL    Total CHOL/HDL Ratio 2.9      VLDL 12  0 - 40 mg/dL    LDL Cholesterol 84  0 - 99 mg/dL   CBC     Status: Abnormal   Collection Time   03/28/12  3:48 AM      Component Value Range Comment   WBC 2.7 (*) 4.0 - 10.5 K/uL    RBC 3.76 (*) 4.22 - 5.81 MIL/uL    Hemoglobin 12.0 (*) 13.0 - 17.0 g/dL    HCT 91.4 (*) 78.2 - 52.0 %    MCV 94.7  78.0 - 100.0 fL    MCH 31.9  26.0 - 34.0 pg    MCHC 33.7  30.0 - 36.0 g/dL    RDW 95.6 (*) 21.3 - 15.5 %    Platelets 64 (*) 150 - 400 K/uL CONSISTENT WITH PREVIOUS RESULT  GLUCOSE, CAPILLARY     Status: Abnormal   Collection Time   03/28/12  8:18 AM      Component Value Range Comment   Glucose-Capillary 69 (*) 70 - 99 mg/dL     Mr Breylen Agyeman Wo Contrast  03/27/2012  *RADIOLOGY REPORT*  Clinical Data: 61 year old male with multiple myeloma.  Blurred vision.  MRI HEAD WITHOUT AND WITH CONTRAST  Technique:  Multiplanar, multiecho pulse sequences of the brain and surrounding structures were obtained according to standard protocol without and with intravenous contrast  Contrast: 17mL MULTIHANCE GADOBENATE DIMEGLUMINE 529 MG/ML IV SOLN  Comparison: None.  Findings: Bone marrow signal is heterogeneous but overall appears to be within normal limits throughout the visualized skull and upper cervical spine.  No restricted diffusion to suggest acute infarction.  No midline shift, mass effect, evidence of mass lesion, ventriculomegaly, extra-axial collection or acute intracranial hemorrhage. Cervicomedullary junction and pituitary are within normal limits. Major intracranial vascular flow voids are preserved.  Mild for age cerebral white matter T2 and FLAIR hyperintensity, mostly periventricular.  No abnormal enhancement of the brain.  No abnormal dural thickening or enhancement identified.  Negative visualized cervical spine.  Visualized orbit soft tissues are within normal limits.  Optic chiasm is within  normal limits. Mild ethmoid sinus mucosal thickening.  Other Visualized paranasal sinuses and mastoids are clear.  Negative scalp soft tissues.  IMPRESSION: 1. No acute or metastatic intracranial abnormality. 2.  Mildly heterogeneous but otherwise essentially normal skull bone marrow signal without strong evidence of active myelomatous involvement.  Original Report Authenticated By: Harley Hallmark, M.D.     Assessment/Plan:   Transient diplopia with presyncope. No neurological or MRI examination findings to suggest a structural cause for his diplopia, although a vascular etiology is possible. Symptoms not consistent with myasthenia gravis. Transient, short duration of episode with complete resolution, as well as nonfocal neurological exam are reassuring; however, TIA should be considered. DDx includes transient hypotension with vertebrobasilar hypoperfusion. Recommend MRA of head and neck to further evaluate for possible critical vertebral  or basilar artery stenosis.     Electronically signed: Dr. Caryl Pina

## 2012-03-28 NOTE — Progress Notes (Signed)
  Echocardiogram 2D Echocardiogram has been performed.  Cathie Beams 03/28/2012, 9:26 AM

## 2012-03-28 NOTE — Progress Notes (Signed)
PT Cancellation Note  ___Treatment cancelled today due to medical issues with patient which prohibited   therapy  ___x Treatment cancelled today due to patient receiving procedure or test   ___ Treatment cancelled today due to patient's refusal to participate   ___ Treatment cancelled today due to    [facility] T [phone]

## 2012-03-29 ENCOUNTER — Telehealth: Payer: Self-pay | Admitting: *Deleted

## 2012-03-29 ENCOUNTER — Other Ambulatory Visit: Payer: Self-pay | Admitting: Oncology

## 2012-03-29 DIAGNOSIS — G459 Transient cerebral ischemic attack, unspecified: Principal | ICD-10-CM

## 2012-03-29 DIAGNOSIS — C9000 Multiple myeloma not having achieved remission: Secondary | ICD-10-CM

## 2012-03-29 DIAGNOSIS — D696 Thrombocytopenia, unspecified: Secondary | ICD-10-CM

## 2012-03-29 DIAGNOSIS — Z9484 Stem cells transplant status: Secondary | ICD-10-CM

## 2012-03-29 LAB — GLUCOSE, CAPILLARY: Glucose-Capillary: 77 mg/dL (ref 70–99)

## 2012-03-29 LAB — CBC
Platelets: 58 10*3/uL — ABNORMAL LOW (ref 150–400)
RBC: 3.7 MIL/uL — ABNORMAL LOW (ref 4.22–5.81)
WBC: 3 10*3/uL — ABNORMAL LOW (ref 4.0–10.5)

## 2012-03-29 MED ORDER — LORATADINE 10 MG PO TABS: 10.0000 mg | ORAL_TABLET | Freq: Every day | ORAL | Status: DC

## 2012-03-29 MED ORDER — ASPIRIN 81 MG PO TABS: 81.0000 mg | ORAL_TABLET | Freq: Every day | ORAL | Status: DC

## 2012-03-29 MED ORDER — FLUTICASONE PROPIONATE 50 MCG/ACT NA SUSP: 1.0000 | Freq: Every day | NASAL | Status: DC

## 2012-03-29 MED ORDER — DOXYCYCLINE HYCLATE 50 MG PO CAPS: 100.0000 mg | ORAL_CAPSULE | Freq: Two times a day (BID) | ORAL | Status: AC

## 2012-03-29 NOTE — Progress Notes (Signed)
MRA of neck reviewed. Proximal left vertebral artery tortuosity with possible focal stenosis noted. This could be due to artifact or atherosclerotic disease. CTA could be obtained to further evaluate, however, confirmation of atherosclerotic narrowing would not change treatment. The patient has a normal caliber right vertebral artery, which provides most of the flow-related signal to the basilar artery. Would avoid hypotension, maintain good hydration, start ASA and Lipitor if no contraindications.   Please call neurology to discuss further if necessary.   Electronically signed: Dr. Caryl Pina

## 2012-03-29 NOTE — Consult Note (Addendum)
Date of Admission:  03/27/2012  Date of Consult:  03/29/2012  Reason for Consult:Rash, thrmbocytpoenia Referring Physician: Magrinat  Impression/Recommendation Rash Vasculitis vs RMSF/ricketsia Check ANA, ANCA, ACE RMSF, Ehrlichia Check HIV, RPR Skin BX If he is d/c, give him rx for doxy to take if he develops fever or any clinical change.   Comment- very odd rash (spares palms and soles). He is not so immunosuppressed that he would have OIs (KS). He could have RMSF but would expect more diffuse rash (his decreased PLT fit with this, his normal/near normal LFTs, and lack of fever do not). A vasculitis (Wegner's) would fit his sinus congestion and rash.   Thank you so much for this interesting consult,   Andre [name] [phone]  Andre [name] is an 61 y.o. male.  HPI: 61 yo M with hx of Multiple Myeloma, HTN, hyperlipidemia, adm 7-10 with 1-2 minutes of diplopia, slurred speech, and pre-syncope.  He has felt unwell for a month (fatigue, nausea) and has had slurred speech for last several days. He had MRI that showed no acute event.  He has also developed a rash over last 3 days(first in his groin, then onto his LE, then onto his trunk). States he gets this rash yearly, resolved with steroids. Occasionally pruritic. Pt attributes rash to heat.  No fever or chills.  His labs are notable for a mild leukopenia, anemia and thrombocytopenia (58-60s).  He has no hx of tick bite. Has a dog (for last 9 months) Recently started on trazadone for sleep.   Past Medical History  Diagnosis Date  . Hypertension   . Hyperlipidemia   . Duodenal ulcer   . Multiple myeloma 07/04/2011    Past Surgical History  Procedure Date  . Cardiolite study 11/25/2003    NORMAL  ergies:   Allergies  Allergen Reactions  . Crestor (Rosuvastatin Calcium)   . Lipitor (Atorvastatin Calcium)     Medications:  Scheduled:   . ezetimibe  10 mg Oral Daily  . fluconazole  100 mg Oral Daily  . fluticasone  1  spray Each Nare Daily  . guaiFENesin  1,200 mg Oral BID  . hydrocortisone cream   Topical BID  . loratadine  10 mg Oral Daily  . LORazepam  0.5 mg Oral QHS  . pantoprazole  40 mg Oral Q1200  . senna  2 tablet Oral QHS  . traZODone  50 mg Oral QHS    Social History:  reports that he quit smoking about 43 years ago. He has never used smokeless tobacco. He reports that he does not drink alcohol or use illicit drugs.  Family History  Problem Relation Age of Onset  . Cancer Mother   . Hypertension Father   . Stroke Father   . Asthma Father   . Diabetes Father     General ROS: normal BM, no oral ulcers, normal urination, no nose or gum bleeds, denies ETOH/tobacco. Recently separated.  Blood pressure 137/83, pulse 51, temperature 97.7 F (36.5 C), temperature source Oral, resp. rate 16, height 5\' 9"  (1.753 m), weight 83.8 kg (184 lb 11.9 oz), SpO2 96.00%. General appearance: alert, cooperative and no distress Eyes: negative findings: pupils equal, round, reactive to light and accomodation and bilateral arcus senilus. Throat: abnormal findings: small erythematous areas on anterior pharynx Neck: no adenopathy and supple, symmetrical, trachea midline Lungs: clear to auscultation bilaterally Heart: regular rate and rhythm Abdomen: normal findings: bowel sounds normal and soft, non-tender Extremities: edema none Skin: diffuse petchiea on  bilateral LE   Results for orders placed during the hospital encounter of 03/27/12 (from the past 48 hour(s))  URINE RAPID DRUG SCREEN (HOSP PERFORMED)     Status: Normal   Collection Time   03/27/12  9:27 PM      Component Value Range Comment   Opiates NONE DETECTED  NONE DETECTED    Cocaine NONE DETECTED  NONE DETECTED    Benzodiazepines NONE DETECTED  NONE DETECTED    Amphetamines NONE DETECTED  NONE DETECTED    Tetrahydrocannabinol NONE DETECTED  NONE DETECTED    Barbiturates NONE DETECTED  NONE DETECTED   GLUCOSE, CAPILLARY     Status: Normal     Collection Time   03/27/12 10:02 PM      Component Value Range Comment   Glucose-Capillary 94  70 - 99 mg/dL   LIPID PANEL     Status: Normal   Collection Time   03/28/12  3:48 AM      Component Value Range Comment   Cholesterol 147  0 - 200 mg/dL    Triglycerides 60  <161 mg/dL    HDL 51  >09 mg/dL    Total CHOL/HDL Ratio 2.9      VLDL 12  0 - 40 mg/dL    LDL Cholesterol 84  0 - 99 mg/dL   CBC     Status: Abnormal   Collection Time   03/28/12  3:48 AM      Component Value Range Comment   WBC 2.7 (*) 4.0 - 10.5 K/uL    RBC 3.76 (*) 4.22 - 5.81 MIL/uL    Hemoglobin 12.0 (*) 13.0 - 17.0 g/dL    HCT 60.4 (*) 54.0 - 52.0 %    MCV 94.7  78.0 - 100.0 fL    MCH 31.9  26.0 - 34.0 pg    MCHC 33.7  30.0 - 36.0 g/dL    RDW 98.1 (*) 19.1 - 15.5 %    Platelets 64 (*) 150 - 400 K/uL CONSISTENT WITH PREVIOUS RESULT  GLUCOSE, CAPILLARY     Status: Abnormal   Collection Time   03/28/12  8:18 AM      Component Value Range Comment   Glucose-Capillary 69 (*) 70 - 99 mg/dL   GLUCOSE, CAPILLARY     Status: Normal   Collection Time   03/28/12 11:48 AM      Component Value Range Comment   Glucose-Capillary 83  70 - 99 mg/dL   GLUCOSE, CAPILLARY     Status: Normal   Collection Time   03/28/12  4:51 PM      Component Value Range Comment   Glucose-Capillary 86  70 - 99 mg/dL   GLUCOSE, CAPILLARY     Status: Normal   Collection Time   03/28/12 10:48 PM      Component Value Range Comment   Glucose-Capillary 79  70 - 99 mg/dL   CBC     Status: Abnormal   Collection Time   03/29/12  3:40 AM      Component Value Range Comment   WBC 3.0 (*) 4.0 - 10.5 K/uL    RBC 3.70 (*) 4.22 - 5.81 MIL/uL    Hemoglobin 11.7 (*) 13.0 - 17.0 g/dL    HCT 47.8 (*) 29.5 - 52.0 %    MCV 93.2  78.0 - 100.0 fL    MCH 31.6  26.0 - 34.0 pg    MCHC 33.9  30.0 - 36.0 g/dL    RDW 62.1 (*)  11.5 - 15.5 %    Platelets 58 (*) 150 - 400 K/uL CONSISTENT WITH PREVIOUS RESULT  GLUCOSE, CAPILLARY     Status: Normal   Collection  Time   03/29/12  7:32 AM      Component Value Range Comment   Glucose-Capillary 70  70 - 99 mg/dL   GLUCOSE, CAPILLARY     Status: Normal   Collection Time   03/29/12 11:55 AM      Component Value Range Comment   Glucose-Capillary 77  70 - 99 mg/dL    Comment 1 Notify RN      No results found for this basename: sdes, specrequest, cult, reptstatus   Mr [name] Wo Contrast  03/28/2012  *RADIOLOGY REPORT*  Clinical Data:  Diplopia and dizziness.  Multiple myeloma.  MRA HEAD WITHOUT CONTRAST  Technique:  Angiographic images of the Circle of Willis were obtained using MRA technique without intravenous contrast.  Comparison:  MRI head 03/27/2012  Findings:  Both vertebral arteries are patent to the basilar.  PICA is patent bilaterally.  Basilar is widely patent.  Superior cerebellar and posterior cerebral arteries are patent bilaterally.  Internal carotid artery is patent bilaterally without significant stenosis.  Anterior and middle cerebral arteries are patent bilaterally without stenosis.  Negative for cerebral aneurysm.  IMPRESSION: Negative  *RADIOLOGY REPORT*  Clinical Data:  Diplopia and dizziness.  MRA NECK WITHOUT AND WITH CONTRAST  Technique:  Angiographic images of the neck were obtained using MRA technique without and with intravenous contrast.  Carotid stenosis measurements (when applicable) are obtained utilizing NASCET criteria, using the distal internal carotid diameter as the denominator.  Contrast: 17mL MULTIHANCE GADOBENATE DIMEGLUMINE 529 MG/ML IV SOLN  Comparison:  MRI head 03/27/2012  Findings:  Right carotid:  No significant carotid stenosis. Bifurcation is widely patent.  Left carotid:  No significant carotid stenosis or dissection. Bifurcation widely patent.  Vertebral arteries:  Decreased signal in the proximal left vertebral artery may be due to stenosis or tortuosity and artifact. Right vertebral artery is dominant and  widely patent to the basilar.  Left vertebral is patent to the  basilar.  IMPRESSION: No significant carotid stenosis.  Possible stenosis versus artifact in the proximal left vertebral artery.  Original Report Authenticated By: Camelia Phenes, M.D.   Mr Angiogram Neck W Wo Contrast  03/28/2012  *RADIOLOGY REPORT*  Clinical Data:  Diplopia and dizziness.  Multiple myeloma.  MRA HEAD WITHOUT CONTRAST  Technique:  Angiographic images of the Circle of Willis were obtained using MRA technique without intravenous contrast.  Comparison:  MRI head 03/27/2012  Findings:  Both vertebral arteries are patent to the basilar.  PICA is patent bilaterally.  Basilar is widely patent.  Superior cerebellar and posterior cerebral arteries are patent bilaterally.  Internal carotid artery is patent bilaterally without significant stenosis.  Anterior and middle cerebral arteries are patent bilaterally without stenosis.  Negative for cerebral aneurysm.  IMPRESSION: Negative  *RADIOLOGY REPORT*  Clinical Data:  Diplopia and dizziness.  MRA NECK WITHOUT AND WITH CONTRAST  Technique:  Angiographic images of the neck were obtained using MRA technique without and with intravenous contrast.  Carotid stenosis measurements (when applicable) are obtained utilizing NASCET criteria, using the distal internal carotid diameter as the denominator.  Contrast: 17mL MULTIHANCE GADOBENATE DIMEGLUMINE 529 MG/ML IV SOLN  Comparison:  MRI head 03/27/2012  Findings:  Right carotid:  No significant carotid stenosis. Bifurcation is widely patent.  Left carotid:  No significant carotid stenosis or dissection. Bifurcation  widely patent.  Vertebral arteries:  Decreased signal in the proximal left vertebral artery may be due to stenosis or tortuosity and artifact. Right vertebral artery is dominant and  widely patent to the basilar.  Left vertebral is patent to the basilar.  IMPRESSION: No significant carotid stenosis.  Possible stenosis versus artifact in the proximal left vertebral artery.  Original Report Authenticated By:  Camelia Phenes, M.D.   Mr Damarious Holtsclaw Wo Contrast  03/27/2012  *RADIOLOGY REPORT*  Clinical Data: 61 year old male with multiple myeloma.  Blurred vision.  MRI HEAD WITHOUT AND WITH CONTRAST  Technique:  Multiplanar, multiecho pulse sequences of the brain and surrounding structures were obtained according to standard protocol without and with intravenous contrast  Contrast: 17mL MULTIHANCE GADOBENATE DIMEGLUMINE 529 MG/ML IV SOLN  Comparison: None.  Findings: Bone marrow signal is heterogeneous but overall appears to be within normal limits throughout the visualized skull and upper cervical spine.  No restricted diffusion to suggest acute infarction.  No midline shift, mass effect, evidence of mass lesion, ventriculomegaly, extra-axial collection or acute intracranial hemorrhage. Cervicomedullary junction and pituitary are within normal limits. Major intracranial vascular flow voids are preserved.  Mild for age cerebral white matter T2 and FLAIR hyperintensity, mostly periventricular.  No abnormal enhancement of the brain.  No abnormal dural thickening or enhancement identified.  Negative visualized cervical spine.  Visualized orbit soft tissues are within normal limits.  Optic chiasm is within normal limits. Mild ethmoid sinus mucosal thickening.  Other Visualized paranasal sinuses and mastoids are clear.  Negative scalp soft tissues.  IMPRESSION: 1. No acute or metastatic intracranial abnormality. 2.  Mildly heterogeneous but otherwise essentially normal skull bone marrow signal without strong evidence of active myelomatous involvement.  Original Report Authenticated By: Harley Hallmark, M.D.   No results found for this or any previous visit (from the past 240 hour(s)).    03/29/2012, 2:03 PM     LOS: 2 days

## 2012-03-29 NOTE — Progress Notes (Signed)
Andre [name]   DOB:1951/05/12   GN#:[id number]   HQI#:[id number]  Subjective: Andre [name] is reading his Kindle in bed this AM; uneventful night; denies fever at any point, not aware of any tick bites, not aware of any exposures (he moved in to a newly-constructed apartment and the start of his symptoms coincided roughly with the move). He is completing a divorce and admits to some depressive thoughts, was started on trazodone (more to help insomnia than for treatment of depression) last few days. His lenalidomide was stopped about 3 weeks ago, resumed for 2 days, then stopped again   Objective: middle-aged White male examined in bed Filed Vitals:   03/29/12 0533  BP: 145/91  Pulse: 48  Temp: 97.7 F (36.5 C)  Resp: 16    Body mass index is 27.28 kg/(m^2).  Intake/Output Summary (Last 24 hours) at 03/29/12 0748 Last data filed at 03/29/12 0700  Gross per 24 hour  Intake   3360 ml  Output    250 ml  Net   3110 ml     Exam is unchanged c/w yesterday  CBG (last 3)   Basename 03/29/12 0732 03/28/12 2248 03/28/12 1651  GLUCAP 70 79 86     Labs:  Lab Results  Component Value Date   WBC 3.0* 03/29/2012   HGB 11.7* 03/29/2012   HCT 34.5* 03/29/2012   MCV 93.2 03/29/2012   PLT 58* 03/29/2012   NEUTROABS 1.8 03/27/2012    Urine Studies No results found for this basename: UACOL:2,UAPR:2,USPG:2,UPH:2,UTP:2,UGL:2,UKET:2,UBIL:2,UHGB:2,UNIT:2,UROB:2,ULEU:2,UEPI:2,UWBC:2,URBC:2,UBAC:2,CAST:2,CRYS:2,UCOM:2,BILUA:2 in the last 72 hours  Basic Metabolic Panel:  Lab 03/27/12 1324  NA 136  K 3.8  CL 101  CO2 26  GLUCOSE 81  BUN 13  CREATININE 1.15  CALCIUM 9.2  MG --  PHOS --   GFR Estimated Creatinine Clearance: 66.6 ml/min (by C-G formula based on Cr of 1.15). Liver Function Tests:  Lab 03/27/12 1225  AST 43*  ALT 32  ALKPHOS 80  BILITOT 0.3  PROT 6.7  ALBUMIN 3.7   No results found for this basename: LIPASE:5,AMYLASE:5 in the last 168 hours No results found for this  basename: AMMONIA:5 in the last 168 hours Coagulation profile No results found for this basename: INR:5,PROTIME:5 in the last 168 hours  CBC:  Lab 03/29/12 0340 03/28/12 0348 03/27/12 1225  WBC 3.0* 2.7* 3.3*  NEUTROABS -- -- 1.8  HGB 11.7* 12.0* 11.9*  HCT 34.5* 35.6* 35.2*  MCV 93.2 94.7 93.1  PLT 58* 64* 60*   Cardiac Enzymes: No results found for this basename: CKTOTAL:5,CKMB:5,CKMBINDEX:5,TROPONINI:5 in the last 168 hours BNP: No components found with this basename: POCBNP:5 CBG:  Lab 03/29/12 0732 03/28/12 2248 03/28/12 1651 03/28/12 1148 03/28/12 0818  GLUCAP 70 79 86 83 69*   D-Dimer No results found for this basename: DDIMER:2 in the last 72 hours Hgb A1c  Basename 03/27/12 1225  HGBA1C 5.4   Lipid Profile  Basename 03/28/12 0348  CHOL 147  HDL 51  LDLCALC 84  TRIG 60  CHOLHDL 2.9  LDLDIRECT --   Thyroid function studies No results found for this basename: TSH,T4TOTAL,FREET3,T3FREE,THYROIDAB in the last 72 hours Anemia work up No results found for this basename: VITAMINB12:2,FOLATE:2,FERRITIN:2,TIBC:2,IRON:2,RETICCTPCT:2 in the last 72 hours Microbiology No results found for this or any previous visit (from the past 240 hour(s)).    Studies:  [name] MW Contrast  03/28/2012  *RADIOLOGY REPORT*  Clinical Data:  Diplopia and dizziness.  Multiple myeloma.  MRA HEAD WITHOUT CONTRAST  Technique:  Angiographic  images of the Circle of Willis were obtained using MRA technique without intravenous contrast.  Comparison:  MRI head 03/27/2012  Findings:  Both vertebral arteries are patent to the basilar.  PICA is patent bilaterally.  Basilar is widely patent.  Superior cerebellar and posterior cerebral arteries are patent bilaterally.  Internal carotid artery is patent bilaterally without significant stenosis.  Anterior and middle cerebral arteries are patent bilaterally without stenosis.  Negative for cerebral aneurysm.  IMPRESSION: Negative  *RADIOLOGY REPORT*   Clinical Data:  Diplopia and dizziness.  MRA NECK WITHOUT AND WITH CONTRAST  Technique:  Angiographic images of the neck were obtained using MRA technique without and with intravenous contrast.  Carotid stenosis measurements (when applicable) are obtained utilizing NASCET criteria, using the distal internal carotid diameter as the denominator.  Contrast: 17mL MULTIHANCE GADOBENATE DIMEGLUMINE 529 MG/ML IV SOLN  Comparison:  MRI head 03/27/2012  Findings:  Right carotid:  No significant carotid stenosis. Bifurcation is widely patent.  Left carotid:  No significant carotid stenosis or dissection. Bifurcation widely patent.  Vertebral arteries:  Decreased signal in the proximal left vertebral artery may be due to stenosis or tortuosity and artifact. Right vertebral artery is dominant and  widely patent to the basilar.  Left vertebral is patent to the basilar.  IMPRESSION: No significant carotid stenosis.  Possible stenosis versus artifact in the proximal left vertebral artery.  Original Report Authenticated By: Camelia Phenes, M.D.   Mr Angiogram Neck W Wo Contrast  03/28/2012  *RADIOLOGY REPORT*  Clinical Data:  Diplopia and dizziness.  Multiple myeloma.  MRA HEAD WITHOUT CONTRAST  Technique:  Angiographic images of the Circle of Willis were obtained using MRA technique without intravenous contrast.  Comparison:  MRI head 03/27/2012  Findings:  Both vertebral arteries are patent to the basilar.  PICA is patent bilaterally.  Basilar is widely patent.  Superior cerebellar and posterior cerebral arteries are patent bilaterally.  Internal carotid artery is patent bilaterally without significant stenosis.  Anterior and middle cerebral arteries are patent bilaterally without stenosis.  Negative for cerebral aneurysm.  IMPRESSION: Negative  *RADIOLOGY REPORT*  Clinical Data:  Diplopia and dizziness.  MRA NECK WITHOUT AND WITH CONTRAST  Technique:  Angiographic images of the neck were obtained using MRA technique without  and with intravenous contrast.  Carotid stenosis measurements (when applicable) are obtained utilizing NASCET criteria, using the distal internal carotid diameter as the denominator.  Contrast: 17mL MULTIHANCE GADOBENATE DIMEGLUMINE 529 MG/ML IV SOLN  Comparison:  MRI head 03/27/2012  Findings:  Right carotid:  No significant carotid stenosis. Bifurcation is widely patent.  Left carotid:  No significant carotid stenosis or dissection. Bifurcation widely patent.  Vertebral arteries:  Decreased signal in the proximal left vertebral artery may be due to stenosis or tortuosity and artifact. Right vertebral artery is dominant and  widely patent to the basilar.  Left vertebral is patent to the basilar.  IMPRESSION: No significant carotid stenosis.  Possible stenosis versus artifact in the proximal left vertebral artery.  Original Report Authenticated By: Camelia Phenes, M.D.   Mr Johnaton Sonneborn Wo Contrast  03/27/2012  *RADIOLOGY REPORT*  Clinical Data: 61 year old male with multiple myeloma.  Blurred vision.  MRI HEAD WITHOUT AND WITH CONTRAST  Technique:  Multiplanar, multiecho pulse sequences of the brain and surrounding structures were obtained according to standard protocol without and with intravenous contrast  Contrast: 17mL MULTIHANCE GADOBENATE DIMEGLUMINE 529 MG/ML IV SOLN  Comparison: None.  Findings: Bone marrow signal is heterogeneous but overall  appears to be within normal limits throughout the visualized skull and upper cervical spine.  No restricted diffusion to suggest acute infarction.  No midline shift, mass effect, evidence of mass lesion, ventriculomegaly, extra-axial collection or acute intracranial hemorrhage. Cervicomedullary junction and pituitary are within normal limits. Major intracranial vascular flow voids are preserved.  Mild for age cerebral white matter T2 and FLAIR hyperintensity, mostly periventricular.  No abnormal enhancement of the brain.  No abnormal dural thickening or enhancement  identified.  Negative visualized cervical spine.  Visualized orbit soft tissues are within normal limits.  Optic chiasm is within normal limits. Mild ethmoid sinus mucosal thickening.  Other Visualized paranasal sinuses and mastoids are clear.  Negative scalp soft tissues.  IMPRESSION: 1. No acute or metastatic intracranial abnormality. 2.  Mildly heterogeneous but otherwise essentially normal skull bone marrow signal without strong evidence of active myelomatous involvement.  Original Report Authenticated By: Harley Hallmark, M.D.    [facility]* *[facility]* [address] [place], [place] [address] [phone]  ------------------------------------------------------------ Transthoracic Echocardiography  (Report amended )  Patient: Doyce, Saling MR #: [id number] Study Date: 03/28/2012 Gender: M Age: 61 Height: 175.3cm Weight: 83.8kg BSA: 2.80m^2 Pt. Status: Room: 1445  PERFORMING [place] SONOGRAPHER Cathie Beams ORDERING Viyuoh, Adeline cc:  ------------------------------------------------------------  ------------------------------------------------------------ Indications: TIA 435.9.  ------------------------------------------------------------ History: PMH: History of multiple myeloma. Transient diplopia. Rash. Pancytopenia. Schatzki's ring. Mild sleep apnea. Risk factors: Dyslipidemia.  ------------------------------------------------------------ Study Conclusions  - Left ventricle: Poor acoustic windows limit study. Overall LVEF is depressed at approximately 40 to 45% with diffuse hypokinesis. The cavity size was normal. Wall thickness was normal. - Mitral valve: Mild regurgitation. - Left atrium: The atrium was mildly dilated. Transthoracic echocardiography. M-mode, complete 2D, spectral Doppler, and color Doppler. Height: Height: 175.3cm. Height: 69in. Weight: Weight: 83.8kg. Weight: 184.4lb. Body mass index: BMI:  27.3kg/m^2. Body surface area: BSA: 2.64m^2. Blood pressure: 131/83. Patient status: Inpatient. Location: Bedside.  ------------------------------------------------------------  ------------------------------------------------------------ Left ventricle: Poor acoustic windows limit study. Overall LVEF is depressed at approximately 40 to 45% with diffuse hypokinesis. The cavity size was normal. Wall thickness was normal.  ------------------------------------------------------------ Aortic valve: Mildly thickened leaflets. Doppler: Trivial regurgitation.  ------------------------------------------------------------ Mitral valve: Structurally normal valve. Leaflet separation was normal. Doppler: Transvalvular velocity was within the normal range. There was no evidence for stenosis. Mild regurgitation.  ------------------------------------------------------------ Left atrium: The atrium was mildly dilated.  ------------------------------------------------------------ Right ventricle: The cavity size was normal. Wall thickness was normal. Systolic function was normal.  ------------------------------------------------------------ Tricuspid valve: Structurally normal valve. Leaflet separation was normal. Doppler: Transvalvular velocity was within the normal range. No regurgitation.  ------------------------------------------------------------ Right atrium: The atrium was normal in size.  ------------------------------------------------------------ Pericardium: There was no pericardial effusion.  ------------------------------------------------------------  2D measurements Normal Doppler measurements Normal Left ventricle Left ventricle LVID ED, 53 mm 43-52 Ea, lat ann, 7.6 cm/s ------ chord, tiss DP PLAX E/Ea, lat 5.9 ------ LVID ES, 39 mm 23-38 ann, tiss DP 7 chord, Ea, med ann, 6.5 cm/s ------ PLAX tiss DP 3 FS, 26 % >29 E/Ea, med 6.9 ------ chord, ann, tiss DP 5 PLAX  Mitral valve LVPW, ED 8 mm ------ Peak E vel 45. cm/s ------ IVS/LVPW 1 <1.3 4 ratio, ED Peak A vel 67. cm/s ------ Ventricular septum 1 IVS, ED 8 mm ------ Deceleration 356 ms 150-23 Aorta time 0 Root 33 mm ------ Peak E/A 0.7 ------ diam, ED ratio Left atrium Right ventricle AP dim 40.32 mm ------ Sa vel, lat  10 cm/s ------ AP dim 1.98 cm/m^2 <2.2 ann, tiss DP index  ------------------------------------------------------------ Lanetta Inch, Paula [id number]           Assessment: 61 y.o. [place] man admitted with rash, thrombocytopenia, possible TIA, workup negative except for a possibly artefactual drop in his EF by echo  --- with a history of multiple myeloma diagnosed February of 2012, with an initial M. spike of 2.6 g/dL, IFE showing an IgG lambda paraprotein and free lambda light chains in the urine. Cytogenetics showed trisomy 42. Bone marrow biopsy showed a 22% plasmacytosis. Treated with  (1) bortezomib subcutaneously, lenalidomide, and dexamethasone, with repeat bone marrow biopsy may of 2012 showing 10% plasmacytosis  (2) high-dose chemotherapy with BCNU and melphalan at [facility], followed by stem cell rescue July of 2012  (3) on monthly zoledronic acid started December of 2012  (4) low-dose lenalidomide resumed April 2013, held past 3 weeks  Discussion: Andre [name] has been unwell for several weeks. While there is no evidence of active myeloma by recent labs, his immune system would not be expected to be normal one year from his stem-cell transplant. His cytopenias may be due to medication although he has been mostly off lenalidomide (Revlimid) for the past 3 weeks. His thrombocytopenia is not severe enough to explain his rash. He tells me he has had no tick exposures to his knowledge and has had no fever. At this point I have no single diagnosis that can explain all his symptoms. I anticipate he will be going home today.  Plan: I think it may be a good idea for  ID to consult, and I have discussed [name]'s case with Dr Cliffton Asters. He is aware Mr Bellanca likely will go home today. I have arranged for a rest MUGA, as outpatient (July 24). He already has a follow-up appointment with me July 30.   May discharge the patient at your discretion. Thank you for the excellent care provided to Mr. Steppe!     Qasim Diveley C 03/29/2012

## 2012-03-29 NOTE — Discharge Summary (Signed)
Physician Discharge Summary  FINNEAN CERAMI RUE:[id number] DOB: 12/19/1950 DOA: 03/27/2012  PCP: Renee Harder, MD  Admit date: 03/27/2012 Discharge date: 03/29/2012  Recommendations for Outpatient Follow-up:  1. PCP in one to 2 weeks, call for APPT 2. Dr. Aaron Edelman as directed 3. PCP to follow up on ANA, ANCA, ACE  RMSF, Ehrlichia  HIV, RPR results 4.Dr Ninetta Lights at outpatient ID clinic, call for appointment Discharge Diagnoses:  Active Problems:  TIA (transient ischemic attack)  Transient diplopia  Multiple myeloma  Rash  Pancytopenia    Discharge Condition: Improved/Stable Diet recommendation: heart healthy  Brief History of present illness:  The patient is a 61 year old white male with past medical history significant for multiple myeloma, hypertension, hyperlipidemia who arm presents with above complaints. He states that the arm today he had double vision which lasted for a couple of minutes as well as dizziness and then spontaneously resolved. He states that he has had slurred speech for the past few days -and reports that he first noted it after taking Ativan the night of prior, but even after his dose of Ativan was decreased he he still has some slurred speech per family.He denies blurry vision, focal weakness, dysphagia. He reports that he has had nasal congestion, and 'sinus 'headache' but no rhinorrhea or sneezing, or postnasal drip. He denies fevers, cough, shortness of breath, chest pain and no vomiting. He reports that he has been feeling fatigued and had nausea for the past month, and Dr. Darnelle Catalan asked him to stop his Revelmid about 3 days ago. He was seen in the ED and is an MRI of his brain was done and showed no acute intracranial findings. Lab work revealed a platelet count of 60. He is admitted for further evaluation and management. 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 Maday admits to a rash that he first developed several days ago-was started initially from his groin area bilaterally and spread to his lower extremities and and then up to his trunk and upper extremities. She does not report any pruritus. He states that he's had a similar rash sometime in the past but it resolved.   Hospital Course by problem list:  Probable TIA (transient ischemic attack)/ Transient diplopia  -As discussed above, MRI negative for CVA, carotid Dopplers and echo pending.  -holding off aspirin at this time due to his thrombocytopenia  -appreciate neurology input -carotid doppler neg for stenosis,f/u echo  -MRA of the head and neck was done as per neuro recommendations and MRA of the head was negative, MRA of the neck showed no significant carotid stenosis, possible stenosis versus artifact in the proximal left vertebral artery. -Dr Otelia Limes followed up and stated that the MRA neck findings could be due to artifact or atherosclerotic disease. CTA could be obtained to further evaluate, however, confirmation of atherosclerotic narrowing would not change treatment. The patient has a normal caliber right vertebral artery, which provides most of the flow-related signal to the basilar artery. Would avoid hypotension, maintain good hydration, start ASA and Lipitor if no contraindications.  -And reports allergic reaction to Lipitor, so we'll not start Lipitor, will continue his zetia, aspirin has been put on hold due to low platelets and it will be restarted on okay with Dr. Roderic Scarce is to  f/u with him as directed. Multiple myeloma  -Patient is followed by Dr. Darnelle Catalan and states that his Revlimid was stopped 3 days ago  -Appreciate Dr. Darrall Dears input, patient to followup with him as directed.  Pancytopenia/thrombocytopenia  -ASA famciclovir were held in the hospital. And per [facility] okay to discharge from  their standpoint. -He has no gross bleeding at this time, follow up with Dr Darnelle Catalan re:ASA  Also outpt  Rash/?petechial  -pt was seen by Dr Donzetta Starch in past, called his office, but he is out of town till Monday 7/15 -up with him outpatient. -Dr. Moshe Cipro input, please see consult note. Will give patient a prescription for doxycycline as directed and his to keep that on hand and only fill if he has any fever or clinical change as discussed -And is to follow up outpatient follow with his PCP and Dr. Ninetta Lights for results ofCheck ANA, ANCA, ACE  RMSF, Ehrlichia Check HIV, RPR      Procedures: ECHO Study Conclusions  - Left ventricle: Poor acoustic windows limit study. Overall LVEF is depressed at approximately 40 to 45% with diffuse hypokinesis. The cavity size was normal. Wall thickness was normal. - Mitral valve: Mild regurgitation. - Left atrium: The atrium was mildly dilated. Transthoracic echocardiography.  CAROTID DOPPLERS Carotid duplex completed.  Preliminary report: Bilateral: No evidence of hemodynamically significant internal carotid artery stenosis. Vertebral artery flow is antegrade.     Consultations: Neuro- Dr Otelia Limes ONC-Dr Magrinat ID-Dr hatcher   Discharge Exam: Filed Vitals:   03/29/12 1340  BP: 137/83  Pulse: 51  Temp: 97.7 F (36.5 C)  Resp: 16   Filed Vitals:   03/28/12 2250 03/29/12 0248 03/29/12 0533 03/29/12 1340  BP: 143/81 137/88 145/91 137/83  Pulse: 46 47 48 51  Temp: 97.4 F (36.3 C) 97.6 F (36.4 C) 97.7 F (36.5 C) 97.7 F (36.5 C)  TempSrc: Oral Oral Oral Oral  Resp: 16 16 16 16   Height:      Weight:      SpO2: 95% 93% 99% 96%    Exam:  Eyes: PERRL, EOMI, conjunctivae normal, No scleral icterus.  Neck: Supple, Trachea midline normal ROM, No JVD, mass, thyromegaly, or carotid bruit present.  Cardiovascular: RRR, S1 normal, S2 normal, no MRG, pulses symmetric and intact bilaterally  Pulmonary/Chest: CTAB, no wheezes,  rales, or rhonchi  Abdominal: Soft. Non-tender, non-distended, bowel sounds are normal, no masses, organomegaly, or guarding present.  Extremities: No cyanosis and no edema  Skin: Warm, dry and intact. Unchanged nonblanching papular/petechial appearing rash diffusely on his lower extremities, on the flexor surfaces of his upper extremities, trunk -none on his face  Discharge Instructions  Discharge Orders    Future Appointments: Provider: Department: Dept Phone: Center:   04/16/2012 2:30 PM Lowella Dell, MD [facility] [phone] None   04/16/2012 3:30 PM [facility] [phone] None   05/14/2012 1:30 PM Windell Hummingbird [facility] [phone] None   05/14/2012 2:00 PM [facility] [phone] None   06/11/2012 1:15 PM Beverely Pace Shumate [facility] [phone] None   06/11/2012 1:45 PM [facility] [phone] None   06/18/2012 10:30 AM Radene Gunning [facility] [phone] None   06/18/2012 11:00 AM Lowella Dell, MD [facility] [phone] None   07/09/2012 11:15 AM Delcie Roch [facility] [phone] None   07/09/2012 11:45 AM [facility] [phone] None     Future Orders Please Complete By Expires   Diet -  low sodium heart healthy      Increase activity slowly        Medication List  As of 03/29/2012  4:49 PM   STOP taking these medications         ZYRTEC PO         TAKE these medications         aspirin 81 MG tablet   Take 1 tablet (81 mg total) by mouth daily.      doxycycline 50 MG capsule   Commonly known as: VIBRAMYCIN   Take 2 capsules (100 mg total) by mouth 2 (two) times daily.      ezetimibe 10 MG tablet   Commonly known as: ZETIA   Take 1 tablet (10 mg total) by mouth daily.      famciclovir 250 MG tablet   Commonly known as: FAMVIR   Take 250 mg by mouth daily.      fish oil-omega-3 fatty acids 1000 MG capsule   Take 2 g by mouth daily.      fluconazole  100 MG tablet   Commonly known as: DIFLUCAN   Take 100 mg by mouth daily.      fluticasone 50 MCG/ACT nasal spray   Commonly known as: FLONASE   Place 1 spray into the nose daily.      guaiFENesin 600 MG 12 hr tablet   Commonly known as: MUCINEX   Take 1,200 mg by mouth 2 (two) times daily.      loratadine 10 MG tablet   Commonly known as: CLARITIN   Take 1 tablet (10 mg total) by mouth daily.      LORazepam 0.5 MG tablet   Commonly known as: ATIVAN   Take 0.5 mg by mouth at bedtime.      omeprazole 40 MG capsule   Commonly known as: PRILOSEC   Take 1 capsule (40 mg total) by mouth daily.      traZODone 50 MG tablet   Commonly known as: DESYREL   Take 1 tablet (50 mg total) by mouth at bedtime.      ZOMETA 4 MG/5ML injection   Generic drug: zolendronic acid   Inject 4 mg into the vein once.              The results of significant diagnostics from this hospitalization (including imaging, microbiology, ancillary and laboratory) are listed below for reference.    Significant Diagnostic Studies: Mr [name] JX Contrast  03/28/2012  *RADIOLOGY REPORT*  Clinical Data:  Diplopia and dizziness.  Multiple myeloma.  MRA HEAD WITHOUT CONTRAST  Technique:  Angiographic images of the Circle of Willis were obtained using MRA technique without intravenous contrast.  Comparison:  MRI head 03/27/2012  Findings:  Both vertebral arteries are patent to the basilar.  PICA is patent bilaterally.  Basilar is widely patent.  Superior cerebellar and posterior cerebral arteries are patent bilaterally.  Internal carotid artery is patent bilaterally without significant stenosis.  Anterior and middle cerebral arteries are patent bilaterally without stenosis.  Negative for cerebral aneurysm.  IMPRESSION: Negative  *RADIOLOGY REPORT*  Clinical Data:  Diplopia and dizziness.  MRA NECK WITHOUT AND WITH CONTRAST  Technique:  Angiographic images of the neck were obtained using MRA technique without and with  intravenous contrast.  Carotid stenosis measurements (when applicable) are obtained utilizing NASCET criteria, using the distal internal carotid diameter as the denominator.  Contrast: 17mL MULTIHANCE GADOBENATE DIMEGLUMINE 529 MG/ML IV SOLN  Comparison:  MRI head 03/27/2012  Findings:  Right carotid:  No significant carotid stenosis. Bifurcation is widely patent.  Left carotid:  No significant carotid stenosis or dissection. Bifurcation widely patent.  Vertebral arteries:  Decreased signal in the proximal left vertebral artery may be due to stenosis or tortuosity and artifact. Right vertebral artery is dominant and  widely patent to the basilar.  Left vertebral is patent to the basilar.  IMPRESSION: No significant carotid stenosis.  Possible stenosis versus artifact in the proximal left vertebral artery.  Original Report Authenticated By: Camelia Phenes, M.D.   Mr Angiogram Neck W Wo Contrast  03/28/2012  *RADIOLOGY REPORT*  Clinical Data:  Diplopia and dizziness.  Multiple myeloma.  MRA HEAD WITHOUT CONTRAST  Technique:  Angiographic images of the Circle of Willis were obtained using MRA technique without intravenous contrast.  Comparison:  MRI head 03/27/2012  Findings:  Both vertebral arteries are patent to the basilar.  PICA is patent bilaterally.  Basilar is widely patent.  Superior cerebellar and posterior cerebral arteries are patent bilaterally.  Internal carotid artery is patent bilaterally without significant stenosis.  Anterior and middle cerebral arteries are patent bilaterally without stenosis.  Negative for cerebral aneurysm.  IMPRESSION: Negative  *RADIOLOGY REPORT*  Clinical Data:  Diplopia and dizziness.  MRA NECK WITHOUT AND WITH CONTRAST  Technique:  Angiographic images of the neck were obtained using MRA technique without and with intravenous contrast.  Carotid stenosis measurements (when applicable) are obtained utilizing NASCET criteria, using the distal internal carotid diameter as the  denominator.  Contrast: 17mL MULTIHANCE GADOBENATE DIMEGLUMINE 529 MG/ML IV SOLN  Comparison:  MRI head 03/27/2012  Findings:  Right carotid:  No significant carotid stenosis. Bifurcation is widely patent.  Left carotid:  No significant carotid stenosis or dissection. Bifurcation widely patent.  Vertebral arteries:  Decreased signal in the proximal left vertebral artery may be due to stenosis or tortuosity and artifact. Right vertebral artery is dominant and  widely patent to the basilar.  Left vertebral is patent to the basilar.  IMPRESSION: No significant carotid stenosis.  Possible stenosis versus artifact in the proximal left vertebral artery.  Original Report Authenticated By: Camelia Phenes, M.D.   Mr Milen Lengacher Wo Contrast  03/27/2012  *RADIOLOGY REPORT*  Clinical Data: 61 year old male with multiple myeloma.  Blurred vision.  MRI HEAD WITHOUT AND WITH CONTRAST  Technique:  Multiplanar, multiecho pulse sequences of the brain and surrounding structures were obtained according to standard protocol without and with intravenous contrast  Contrast: 17mL MULTIHANCE GADOBENATE DIMEGLUMINE 529 MG/ML IV SOLN  Comparison: None.  Findings: Bone marrow signal is heterogeneous but overall appears to be within normal limits throughout the visualized skull and upper cervical spine.  No restricted diffusion to suggest acute infarction.  No midline shift, mass effect, evidence of mass lesion, ventriculomegaly, extra-axial collection or acute intracranial hemorrhage. Cervicomedullary junction and pituitary are within normal limits. Major intracranial vascular flow voids are preserved.  Mild for age cerebral white matter T2 and FLAIR hyperintensity, mostly periventricular.  No abnormal enhancement of the brain.  No abnormal dural thickening or enhancement identified.  Negative visualized cervical spine.  Visualized orbit soft tissues are within normal limits.  Optic chiasm is within normal limits. Mild ethmoid sinus mucosal  thickening.  Other Visualized paranasal sinuses and mastoids are clear.  Negative scalp soft tissues.  IMPRESSION: 1. No acute or metastatic intracranial abnormality. 2.  Mildly heterogeneous but otherwise essentially normal skull bone marrow signal without strong evidence of active myelomatous involvement.  Original  Report Authenticated By: Harley Hallmark, M.D.    Microbiology: No results found for this or any previous visit (from the past 240 hour(s)).   Labs: Basic Metabolic Panel:  Lab 03/27/12 1610  NA 136  K 3.8  CL 101  CO2 26  GLUCOSE 81  BUN 13  CREATININE 1.15  CALCIUM 9.2  MG --  PHOS --   Liver Function Tests:  Lab 03/27/12 1225  AST 43*  ALT 32  ALKPHOS 80  BILITOT 0.3  PROT 6.7  ALBUMIN 3.7   No results found for this basename: LIPASE:5,AMYLASE:5 in the last 168 hours No results found for this basename: AMMONIA:5 in the last 168 hours CBC:  Lab 03/29/12 0340 03/28/12 0348 03/27/12 1225  WBC 3.0* 2.7* 3.3*  NEUTROABS -- -- 1.8  HGB 11.7* 12.0* 11.9*  HCT 34.5* 35.6* 35.2*  MCV 93.2 94.7 93.1  PLT 58* 64* 60*   Cardiac Enzymes: No results found for this basename: CKTOTAL:5,CKMB:5,CKMBINDEX:5,TROPONINI:5 in the last 168 hours BNP: BNP (last 3 results) No results found for this basename: PROBNP:3 in the last 8760 hours CBG:  Lab 03/29/12 1155 03/29/12 0732 03/28/12 2248 03/28/12 1651 03/28/12 1148  GLUCAP 77 70 79 86 83    Time coordinating discharge: >69mins  Signed:  Zohan Shiflet C  [facility] 03/29/2012, 4:49 PM

## 2012-03-29 NOTE — Evaluation (Signed)
Physical Therapy Evaluation Patient Details Name: Andre [name] MRN: [id number] DOB: [birth year] Today's Date: 03/29/2012 Time: 4782-9562 PT Time Calculation (min): 13 min  PT Assessment / Plan / Recommendation Clinical Impression  Pt presents with TIA with history of multiple myeloma.  Tolerated ambulation in hallway with no AD at independent level with no LOB or instability noted with head turns with gait.  Pt will not require any further follow up in acute care or at D/C.      PT Assessment  Patent does not need any further PT services    Follow Up Recommendations  No PT follow up    Barriers to Discharge        Equipment Recommendations  None recommended by PT    Recommendations for Other Services     Frequency      Precautions / Restrictions Precautions Precautions: None Restrictions Weight Bearing Restrictions: No   Pertinent Vitals/Pain No pain      Mobility  Bed Mobility Bed Mobility: Supine to Sit Supine to Sit: 7: Independent Transfers Transfers: Sit to Stand;Stand to Sit Sit to Stand: 7: Independent;From bed;With upper extremity assist Stand to Sit: 7: Independent;With upper extremity assist;With armrests;To chair/3-in-1 Ambulation/Gait Ambulation/Gait Assistance: 7: Independent Ambulation Distance (Feet): 400 Feet Assistive device: None Gait Pattern: Within Functional Limits Stairs: No Wheelchair Mobility Wheelchair Mobility: No    Exercises     PT Diagnosis:    PT Problem List:   PT Treatment Interventions:     PT Goals    Visit Information  Last PT Received On: 03/29/12 Assistance Needed: +1    Subjective Data  Subjective: I slept pretty well last night Patient Stated Goal: to get home today   Prior Functioning  Home Living Lives With: Alone Available Help at Discharge: Family Type of Home: House Home Access: Level entry Home Layout: One level Bathroom Shower/Tub: Engineer, manufacturing systems: Standard Home Adaptive  Equipment: Straight cane Prior Function Level of Independence: Independent Able to Take Stairs?: Yes Driving: Yes Communication Communication: No difficulties    Cognition  Overall Cognitive Status: Appears within functional limits for tasks assessed/performed Arousal/Alertness: Awake/alert Orientation Level: Appears intact for tasks assessed Behavior During Session: [facility] for tasks performed    Extremity/Trunk Assessment Right Lower Extremity Assessment RLE ROM/Strength/Tone: Within functional levels RLE Coordination: WFL - gross motor Left Lower Extremity Assessment LLE ROM/Strength/Tone: Within functional levels LLE Coordination: WFL - gross motor Trunk Assessment Trunk Assessment: Normal   Balance High Level Balance High Level Balance Activites: Head turns High Level Balance Comments: Had pt ambulate approx 60-70' perfomring head turns side to side and up/down with no LOB/instability noted.   End of Session PT - End of Session Activity Tolerance: Patient tolerated treatment well Patient left: in chair;with call bell/phone within reach;with family/visitor present Nurse Communication: Mobility status  GP     Page, Meribeth Mattes 03/29/2012, 9:47 AM

## 2012-03-29 NOTE — Telephone Encounter (Signed)
Patient is currently in room 1445 at [facility]. Outpatient MUGA scan can not be schedule while patient is in the hospital. You can order it as an inpatient or wait till she is discharged to schedule as an out patient. Sent md and email thru epic to inform him of the patient status

## 2012-03-30 ENCOUNTER — Other Ambulatory Visit: Payer: Self-pay | Admitting: Oncology

## 2012-03-30 LAB — RPR: RPR Ser Ql: NONREACTIVE

## 2012-04-01 ENCOUNTER — Other Ambulatory Visit: Payer: Self-pay | Admitting: *Deleted

## 2012-04-01 DIAGNOSIS — C9000 Multiple myeloma not having achieved remission: Secondary | ICD-10-CM

## 2012-04-01 LAB — ANCA SCREEN W REFLEX TITER: Atypical p-ANCA Screen: NEGATIVE

## 2012-04-01 LAB — HIV-1 RNA QUANT-NO REFLEX-BLD
HIV 1 RNA Quant: <20 copies/mL (ref <20)
HIV-1 RNA Quant, Log: <1.3 {Log} (ref <1.30)

## 2012-04-01 MED ORDER — LORAZEPAM 0.5 MG PO TABS: ORAL_TABLET | ORAL | Status: DC

## 2012-04-02 ENCOUNTER — Telehealth: Payer: Self-pay | Admitting: Oncology

## 2012-04-02 NOTE — Telephone Encounter (Signed)
S/w the pt and he is aware of his lab and muga scan appt

## 2012-04-10 ENCOUNTER — Other Ambulatory Visit (HOSPITAL_BASED_OUTPATIENT_CLINIC_OR_DEPARTMENT_OTHER): Payer: PRIVATE HEALTH INSURANCE | Admitting: Lab

## 2012-04-10 ENCOUNTER — Encounter (HOSPITAL_COMMUNITY)
Admission: RE | Admit: 2012-04-10 | Discharge: 2012-04-10 | Disposition: A | Discharge status: Home / Self Care | Payer: PRIVATE HEALTH INSURANCE | Source: Ambulatory Visit | Attending: Oncology | Admitting: Oncology

## 2012-04-10 DIAGNOSIS — C9000 Multiple myeloma not having achieved remission: Secondary | ICD-10-CM

## 2012-04-10 LAB — CBC & DIFF AND RETIC
Basophils Absolute: 0.1 10*3/uL (ref 0.0–0.1)
Eosinophils Absolute: 0.6 10*3/uL — ABNORMAL HIGH (ref 0.0–0.5)
HGB: 12.7 g/dL — ABNORMAL LOW (ref 13.0–17.1)
Immature Retic Fract: 14.4 % — ABNORMAL HIGH (ref 3.00–10.60)
NEUT#: 1.7 10*3/uL (ref 1.5–6.5)
RBC: 3.93 10*6/uL — ABNORMAL LOW (ref 4.20–5.82)
RDW: 17.9 % — ABNORMAL HIGH (ref 11.0–14.6)
Retic %: 1.12 % (ref 0.80–1.80)
Retic Ct Abs: 44.02 10*3/uL (ref 34.80–93.90)
WBC: 4.1 10*3/uL (ref 4.0–10.3)
lymph#: 1.4 10*3/uL (ref 0.9–3.3)

## 2012-04-10 LAB — PROTEIN / CREATININE RATIO, URINE: Protein Creatinine Ratio: 0.09 (ref <0.15)

## 2012-04-10 MED ORDER — TECHNETIUM TC 99M-LABELED RED BLOOD CELLS IV KIT
24.8000 | PACK | Freq: Once | INTRAVENOUS | Status: AC | PRN
  Administered 2012-04-10: 24.8 via INTRAVENOUS

## 2012-04-12 LAB — COMPREHENSIVE METABOLIC PANEL
ALT: 30 U/L (ref 0–53)
AST: 53 U/L — ABNORMAL HIGH (ref 0–37)
Alkaline Phosphatase: 63 U/L (ref 39–117)
BUN: 16 mg/dL (ref 6–23)
Calcium: 9.9 mg/dL (ref 8.4–10.5)
Chloride: 103 mEq/L (ref 96–112)
Creatinine, Ser: 1.49 mg/dL — ABNORMAL HIGH (ref 0.50–1.35)
Potassium: 4.2 mEq/L (ref 3.5–5.3)

## 2012-04-12 LAB — PROTEIN ELECTROPHORESIS, SERUM
Albumin ELP: 64.7 % (ref 55.8–66.1)
Beta Globulin: 6.3 % (ref 4.7–7.2)
Total Protein, Serum Electrophoresis: 6.9 g/dL (ref 6.0–8.3)

## 2012-04-12 LAB — SEDIMENTATION RATE: Sed Rate: 5 mm/hr (ref 0–16)

## 2012-04-12 LAB — KAPPA/LAMBDA LIGHT CHAINS
Kappa free light chain: 2.89 mg/dL — ABNORMAL HIGH (ref 0.33–1.94)
Kappa:Lambda Ratio: 1.55 (ref 0.26–1.65)

## 2012-04-16 ENCOUNTER — Telehealth: Payer: Self-pay | Admitting: *Deleted

## 2012-04-16 ENCOUNTER — Ambulatory Visit (HOSPITAL_BASED_OUTPATIENT_CLINIC_OR_DEPARTMENT_OTHER): Payer: PRIVATE HEALTH INSURANCE | Admitting: Oncology

## 2012-04-16 ENCOUNTER — Other Ambulatory Visit: Payer: PRIVATE HEALTH INSURANCE | Admitting: Lab

## 2012-04-16 ENCOUNTER — Ambulatory Visit: Payer: PRIVATE HEALTH INSURANCE

## 2012-04-16 ENCOUNTER — Other Ambulatory Visit: Payer: Self-pay | Admitting: Oncology

## 2012-04-16 VITALS — BP 153/82 | HR 58 | Temp 98.4°F | Ht 69.0 in | Wt 180.4 lb

## 2012-04-16 DIAGNOSIS — K277 Chronic peptic ulcer, site unspecified, without hemorrhage or perforation: Secondary | ICD-10-CM

## 2012-04-16 DIAGNOSIS — C9000 Multiple myeloma not having achieved remission: Secondary | ICD-10-CM

## 2012-04-16 DIAGNOSIS — K5909 Other constipation: Secondary | ICD-10-CM

## 2012-04-16 NOTE — Telephone Encounter (Signed)
Patient's appointments have all ready been scheduled printed out calendar and gave to the patient

## 2012-04-16 NOTE — Progress Notes (Signed)
ID: Andre [name]   DOB: [birth year]  MR#: [id number]  FAO#:[id number]  HISTORY OF PRESENT ILLNESS: The patient was worked up for peptic ulcer disease in August of 2011, with significant bleeding and anemia.  The patient was Helicobacter pylori negative.  He received some epinephrine when he had his EGD and then started on Protonix.  [name] anemia slowly resolved so that by September, his hemoglobin was up to 10.9 and by earlier this month, his hemoglobin was up to 12.5.    As part of his general workup, he was found to have a slightly elevated globulin fraction.  In September, his total protein was 8.3 with an albumin of 3.8.  In January, the total protein was 8.4 and albumin 3.6.  With persistence of this slight abnormality, Dr. Juanda [name] obtained serum immunofixation and SPEP.  The SPTP showed an M-spike of 2.67 grams.  A total IgG was 4,190.  Total IgA low at 28.  Total IgM low at 28 also.  The immunofixation showed a monoclonal IgG lambda paraprotein.  There were also monoclonal free lambda light chains present. With this information, the patient was referred for further evaluation. A diagnosis of myeloma was confirmed by bone marrow biopsy and subsequebnt treatment is as detailed below  INTERVAL HISTORY: Andre [name] returns today with his daughter for followup of his multiple myeloma. Since the last visit here he had a hospitalization for what initially was thought might be a stroke. He had extensive workup including brain MRI, and brain and neck MRA is, all of which were negative. He had a rest MUGA which was fine as well. In addition, because he had a rash, and evidence of malaise for several weeks, as well as a slight transaminitis, we obtained an infectious disease consult. Extensive lab work was obtained including IgM 4 [facility] spotted fever and early 2 uses, ANA and ANCA titers, RPR, and HIV, all of which were negative.   REVIEW OF SYSTEMS: He tells me he saw his dermatologist, Dr. Donzetta [name], regarding his rash, and that Dr. Yetta [name] feels this is a form of eczema. He is treating it with a lotion. Andre [name] is working a little bit harder than before, and "getting back to normal". She still speaks with a nasal voice and sounds slightly hoarse. He usually wakes up at 3:45 in the morning so he can exercise between 345 and 7 AM, but more recently he has been "in bulge and," and getting about 445. He continues to jog, swim, and do spin class. He remains off Revlimid. He is still constipated. He is not drinking as much as I would like. Otherwise a detailed review of systems today was entirely negative.  PAST MEDICAL HISTORY: Past Medical History  Diagnosis Date  . Hypertension   . Hyperlipidemia   . Duodenal ulcer   . Multiple myeloma 07/04/2011  Significant for peptic ulcer disease as noted above.  History of hyperlipidemia.  History of anxiety and depression.  History of GERD which is significantly improved with weight loss and history of reactive airway disease, possibly secondary to the GERD which also improved with weight loss.  There was a history of obesity, now much improved secondary to exercise and diet.   PAST SURGICAL HISTORY: Past Surgical History  Procedure Date  . Cardiolite study 11/25/2003    NORMAL    FAMILY HISTORY Family History  Problem Relation Age of Onset  . Cancer Mother   . Hypertension Father   . Stroke Father   .  Asthma Father   . Diabetes Father   [name] father died at the age of 66 following a stroke.  [name] mother died with cancer of the throat at the age of 77.   She had a history of depression and was a smoker.  Not clear how much alcohol she drank, according to the patient.  The patient had two brothers.  One died from the age of 6 from a "kidney problem."  The second one died at the age of 40 from pancreatic cancer. (This may have been duodenal cancer. The patient is not sure.)  SOCIAL HISTORY: Andre [name] works as a Surveyor, minerals.  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  His wife of 37 years is Andre [name].  Unfortunately, they are in the process of divorce. The patient has two daughters:  Andre [name] who is 46, and Andre [name] who is 5.  They both live here in [place]. 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  The patient is very close to his daughters.  There are no grandchildren.  The patient is not a church attender.  He does derive a great deal of support from friends at the "[employer]" which he attends very regularly, and also participates in Al-Anon even though actually there is no alcohol or drug problem in the family.  He participates in this group and he gets quite a bit of support from it.     ADVANCED DIRECTIVES:  HEALTH MAINTENANCE: History  Substance Use Topics  . Smoking status: Former Smoker -- 3 years    Quit date: 03/27/1969  . Smokeless tobacco: Never Used  . Alcohol Use: No     Colonoscopy:  PSA:  Lipid panel:  Allergies  Allergen Reactions  . Crestor (Rosuvastatin Calcium)   . Lipitor (Atorvastatin Calcium)     Current Outpatient Prescriptions  Medication Sig Dispense Refill  . aspirin 81 MG tablet Take 1 tablet (81 mg total) by mouth daily.  30 tablet    . ezetimibe (ZETIA) 10 MG tablet Take 1 tablet (10 mg total) by mouth daily.  30 tablet  0  . famciclovir (FAMVIR) 250 MG tablet Take 250 mg by mouth daily.       . fish oil-omega-3 fatty acids 1000 MG capsule Take 2 g by mouth daily.       . fluticasone (FLONASE) 50 MCG/ACT nasal spray Place 1 spray into the nose daily.  16 g  0  . guaiFENesin (MUCINEX) 600 MG 12 hr tablet Take 1,200 mg by mouth 2 (two) times daily.      [name] loratadine (CLARITIN) 10 MG tablet Take 1 tablet (10 mg total) by mouth daily.      [name] LORazepam (ATIVAN) 0.5 MG tablet TAKE 1/2 TO ONE TAB BY MOUTH TWICE DAILY  60 tablet  3  . omeprazole (PRILOSEC) 40 MG capsule Take 1 capsule (40 mg total) by  mouth daily.  30 capsule  2  . traZODone (DESYREL) 50 MG tablet Take 1 tablet (50 mg total) by mouth at bedtime.  30 tablet  3  . zolendronic acid (ZOMETA) 4 MG/5ML injection Inject 4 mg into the vein once.      [name] DISCONTD: fexofenadine (ALLEGRA) 60 MG tablet Take 180 mg by mouth daily.          OBJECTIVE: Middle-aged white male who appears slightly fatigued Filed Vitals:   04/16/12 1439  BP:  153/82  Pulse: 58  Temp: 98.4 F (36.9 C)     Body mass index is 26.64 kg/(m^2).    ECOG FS: 1  Sclerae unicteric; some infraorbital edema, no erythema Oropharynx clear No peripheral adenopathy Lungs no rales or rhonchi Heart regular rate and rhythm Abd benign MSK no focal spinal tenderness, no peripheral edema Neuro: nonfocal; alert and oriented x3; mood slightly depressed  LAB RESULTS:  Lab Results  Component Value Date   WBC 4.1 04/10/2012   NEUTROABS 1.7 04/10/2012   HGB 12.7* 04/10/2012   HCT 36.3* 04/10/2012   MCV 92.4 04/10/2012   PLT 90* 04/10/2012      Chemistry      Component Value Date/Time   NA 140 04/10/2012 1123   K 4.2 04/10/2012 1123   CL 103 04/10/2012 1123   CO2 26 04/10/2012 1123   BUN 16 04/10/2012 1123   CREATININE 1.49* 04/10/2012 1123      Component Value Date/Time   CALCIUM 9.9 04/10/2012 1123   ALKPHOS 63 04/10/2012 1123   AST 53* 04/10/2012 1123   ALT 30 04/10/2012 1123   BILITOT 0.5 04/10/2012 1123       No results found for this basename: LABCA2    No components found with this basename: LABCA125    No results found for this basename: INR:1;PROTIME:1 in the last 168 hours  Urinalysis    Component Value Date/Time   COLORURINE YELLOW 05/02/2010 0957   APPEARANCEUR CLEAR 05/02/2010 0957   LABSPEC 1.024 05/02/2010 0957   PHURINE 5.5 05/02/2010 0957   GLUCOSEU NEGATIVE 05/02/2010 0957   HGBUR NEGATIVE 05/02/2010 0957   BILIRUBINUR NEGATIVE 05/02/2010 0957   KETONESUR 15* 05/02/2010 0957   PROTEINUR NEGATIVE 05/02/2010 0957   UROBILINOGEN 0.2 05/02/2010 0957    NITRITE NEGATIVE 05/02/2010 0957   LEUKOCYTESUR NEGATIVE MICROSCOPIC NOT DONE ON URINES WITH NEGATIVE PROTEIN, BLOOD, LEUKOCYTES, NITRITE, OR GLUCOSE <1000 mg/dL. 05/02/2010 0957    Results for Andre [name], Andre [name] (MRN [id number]) as of 04/16/2012 14:53  Ref. Range 01/25/2012 09:33 02/22/2012 08:34 03/07/2012 08:45 03/18/2012 09:47 04/10/2012 11:23  Kappa free light chain Latest Range: 0.33-1.94 mg/dL 5.78 (H) 4.69 (H) 6.29 (H) 4.69 (H) 2.89 (H)   Results for SHEM, PLEMMONS (MRN [id number]) as of 04/16/2012 14:53  Ref. Range 12/28/2011 08:44 01/25/2012 09:33 02/22/2012 08:33 03/18/2012 09:47 04/10/2012 11:23  PROTEIN CREATININE RATIO Latest Range: <0.15  0.10 * * 0.09 0.09   M-spike:not detectable  STUDIES: [name] Wo Contrast  03/28/2012  *RADIOLOGY REPORT*  Clinical Data:  Diplopia and dizziness.  Multiple myeloma.  MRA HEAD WITHOUT CONTRAST  Technique:  Angiographic images of the Circle of Willis were obtained using MRA technique without intravenous contrast.  Comparison:  MRI head 03/27/2012  Findings:  Both vertebral arteries are patent to the basilar.  PICA is patent bilaterally.  Basilar is widely patent.  Superior cerebellar and posterior cerebral arteries are patent bilaterally.  Internal carotid artery is patent bilaterally without significant stenosis.  Anterior and middle cerebral arteries are patent bilaterally without stenosis.  Negative for cerebral aneurysm.  IMPRESSION: Negative  *RADIOLOGY REPORT*  Clinical Data:  Diplopia and dizziness.  MRA NECK WITHOUT AND WITH CONTRAST  Technique:  Angiographic images of the neck were obtained using MRA technique without and with intravenous contrast.  Carotid stenosis measurements (when applicable) are obtained utilizing NASCET criteria, using the distal internal carotid diameter as the denominator.  Contrast: 17mL MULTIHANCE GADOBENATE DIMEGLUMINE 529 MG/ML IV SOLN  Comparison:  MRI head  03/27/2012  Findings:  Right carotid:  No significant carotid  stenosis. Bifurcation is widely patent.  Left carotid:  No significant carotid stenosis or dissection. Bifurcation widely patent.  Vertebral arteries:  Decreased signal in the proximal left vertebral artery may be due to stenosis or tortuosity and artifact. Right vertebral artery is dominant and  widely patent to the basilar.  Left vertebral is patent to the basilar.  IMPRESSION: No significant carotid stenosis.  Possible stenosis versus artifact in the proximal left vertebral artery.  Original Report Authenticated By: Andre [name], M.D.   Mr Angiogram Neck W Wo Contrast  03/28/2012  *RADIOLOGY REPORT*  Clinical Data:  Diplopia and dizziness.  Multiple myeloma.  MRA HEAD WITHOUT CONTRAST  Technique:  Angiographic images of the Circle of Willis were obtained using MRA technique without intravenous contrast.  Comparison:  MRI head 03/27/2012  Findings:  Both vertebral arteries are patent to the basilar.  PICA is patent bilaterally.  Basilar is widely patent.  Superior cerebellar and posterior cerebral arteries are patent bilaterally.  Internal carotid artery is patent bilaterally without significant stenosis.  Anterior and middle cerebral arteries are patent bilaterally without stenosis.  Negative for cerebral aneurysm.  IMPRESSION: Negative  *RADIOLOGY REPORT*  Clinical Data:  Diplopia and dizziness.  MRA NECK WITHOUT AND WITH CONTRAST  Technique:  Angiographic images of the neck were obtained using MRA technique without and with intravenous contrast.  Carotid stenosis measurements (when applicable) are obtained utilizing NASCET criteria, using the distal internal carotid diameter as the denominator.  Contrast: 17mL MULTIHANCE GADOBENATE DIMEGLUMINE 529 MG/ML IV SOLN  Comparison:  MRI head 03/27/2012  Findings:  Right carotid:  No significant carotid stenosis. Bifurcation is widely patent.  Left carotid:  No significant carotid stenosis or dissection. Bifurcation widely patent.  Vertebral arteries:  Decreased  signal in the proximal left vertebral artery may be due to stenosis or tortuosity and artifact. Right vertebral artery is dominant and  widely patent to the basilar.  Left vertebral is patent to the basilar.  IMPRESSION: No significant carotid stenosis.  Possible stenosis versus artifact in the proximal left vertebral artery.  Original Report Authenticated By: Andre [name], M.D.   Mr Graydon Fofana Wo Contrast  03/27/2012  *RADIOLOGY REPORT*  Clinical Data: 61 year old male with multiple myeloma.  Blurred vision.  MRI HEAD WITHOUT AND WITH CONTRAST  Technique:  Multiplanar, multiecho pulse sequences of the brain and surrounding structures were obtained according to standard protocol without and with intravenous contrast  Contrast: 17mL MULTIHANCE GADOBENATE DIMEGLUMINE 529 MG/ML IV SOLN  Comparison: None.  Findings: Bone marrow signal is heterogeneous but overall appears to be within normal limits throughout the visualized skull and upper cervical spine.  No restricted diffusion to suggest acute infarction.  No midline shift, mass effect, evidence of mass lesion, ventriculomegaly, extra-axial collection or acute intracranial hemorrhage. Cervicomedullary junction and pituitary are within normal limits. Major intracranial vascular flow voids are preserved.  Mild for age cerebral white matter T2 and FLAIR hyperintensity, mostly periventricular.  No abnormal enhancement of the brain.  No abnormal dural thickening or enhancement identified.  Negative visualized cervical spine.  Visualized orbit soft tissues are within normal limits.  Optic chiasm is within normal limits. Mild ethmoid sinus mucosal thickening.  Other Visualized paranasal sinuses and mastoids are clear.  Negative scalp soft tissues.  IMPRESSION: 1. No acute or metastatic intracranial abnormality. 2.  Mildly heterogeneous but otherwise essentially normal skull bone marrow signal without strong evidence of active myelomatous involvement.  Original Report  Authenticated By: Andre [name], M.D.   Nm Cardiac Muga Rest  04/10/2012  *RADIOLOGY REPORT*  Clinical Data: Multiple myeloma, receiving chemotherapy  NUCLEAR MEDICINE CARDIAC MULTIPLE UPTAKE GATED ACQUISITION SCAN  Technique: Radiolabeled red blood cells used to perform resting radionuclide ventriculography. Imaging performed in the anterior, LAO, and lateral projections.Resting left ventricular ejection fraction estimated after drawing region of interest curves around the left ventricle during systole and diastole.  Radiopharmaceutical: 24.8MILLI CURIE ULTRATAG TECHNETIUM TC 82M- LABELED RED BLOOD CELLS IV KIT  Comparison: None available  Findings: Resting left ventricular ejection fraction is estimated at 53%.  No gross wall motion abnormality.  IMPRESSION: Estimated EF 53%  Original Report Authenticated By: Judie Petit. Ruel Favors, M.D.    ASSESSMENT: 61 y.o.  [place] man with a history of multiple myeloma diagnosed February of 2012, with an initial M-spike of 2.6 g/dL, IFE showing an IgG lambda paraprotein and free lambda light chains in the urine. Cytogenetics showed trisomy 66. Bone marrow biopsy showed a 22% plasmacytosis. Treated with   (1) bortezomib (subcutaneously), lenalidomide, and dexamethasone, with repeat bone marrow biopsy may of 2012 showing 10% plasmacytosis   (2) high-dose chemotherapy with BCNU and melphalan at [facility], followed by stem cell rescue July of 2012   (3) on monthly zoledronic acid started December of 2012  (4) low-dose lenalidomide resumed April 2013, interrupted several times since   PLAN:   I am not sure exactly what was going on with Andre [name] a few weeks ago, when he was feeling of so poorly, but his entire workup was negative and he seems to be on demand. I have asked him to not resume lenalidomide at this point, since he really has an appointment with Dr. as bread oh in a month and that way he she can evaluate them off treatment and decide again about whether she wants  him back on lenalidomide and at which the os. Note that he remains constipated even though he is not on that medication. I have asked him to drink at least 2 quarts of water daily. His creatinine has risen slightly, to 1.49, doubtless due to the contrast load he received earlier this month, possibly also do to dehydration. I am holding zoledronic acid on total lab results.  We will continue to check his lab work on a monthly basis. He will see me again late October. He knows to call for any problems that may develop before that visit.  Syble Picco C    04/16/2012

## 2012-04-18 ENCOUNTER — Ambulatory Visit: Payer: PRIVATE HEALTH INSURANCE

## 2012-04-18 ENCOUNTER — Other Ambulatory Visit: Payer: PRIVATE HEALTH INSURANCE | Admitting: Lab

## 2012-04-18 DIAGNOSIS — R5383 Other fatigue: Secondary | ICD-10-CM | POA: Insufficient documentation

## 2012-04-26 ENCOUNTER — Other Ambulatory Visit: Payer: PRIVATE HEALTH INSURANCE

## 2012-04-29 ENCOUNTER — Other Ambulatory Visit: Payer: PRIVATE HEALTH INSURANCE

## 2012-05-01 ENCOUNTER — Ambulatory Visit: Payer: PRIVATE HEALTH INSURANCE | Admitting: Nurse Practitioner

## 2012-05-13 ENCOUNTER — Ambulatory Visit (HOSPITAL_COMMUNITY)
Admission: RE | Admit: 2012-05-13 | Discharge: 2012-05-13 | Disposition: A | Discharge status: Home / Self Care | Payer: PRIVATE HEALTH INSURANCE | Source: Ambulatory Visit | Attending: Oncology | Admitting: Oncology

## 2012-05-13 ENCOUNTER — Telehealth: Payer: Self-pay | Admitting: *Deleted

## 2012-05-13 ENCOUNTER — Other Ambulatory Visit: Payer: Self-pay | Admitting: Oncology

## 2012-05-13 DIAGNOSIS — C9000 Multiple myeloma not having achieved remission: Secondary | ICD-10-CM | POA: Insufficient documentation

## 2012-05-13 DIAGNOSIS — Z8781 Personal history of (healed) traumatic fracture: Secondary | ICD-10-CM | POA: Insufficient documentation

## 2012-05-13 DIAGNOSIS — I639 Cerebral infarction, unspecified: Secondary | ICD-10-CM

## 2012-05-13 DIAGNOSIS — M533 Sacrococcygeal disorders, not elsewhere classified: Secondary | ICD-10-CM | POA: Insufficient documentation

## 2012-05-13 DIAGNOSIS — M5146 Schmorl's nodes, lumbar region: Secondary | ICD-10-CM | POA: Insufficient documentation

## 2012-05-13 DIAGNOSIS — R52 Pain, unspecified: Secondary | ICD-10-CM

## 2012-05-13 DIAGNOSIS — F29 Unspecified psychosis not due to a substance or known physiological condition: Secondary | ICD-10-CM | POA: Insufficient documentation

## 2012-05-13 DIAGNOSIS — R413 Other amnesia: Secondary | ICD-10-CM | POA: Insufficient documentation

## 2012-05-13 DIAGNOSIS — Z87898 Personal history of other specified conditions: Secondary | ICD-10-CM | POA: Insufficient documentation

## 2012-05-13 MED ORDER — GADOBENATE DIMEGLUMINE 529 MG/ML IV SOLN
17.0000 mL | Freq: Once | INTRAVENOUS | Status: AC | PRN
  Administered 2012-05-13: 17 mL via INTRAVENOUS

## 2012-05-13 NOTE — Telephone Encounter (Signed)
Pt left to  to this RN to inquire about scheduled appts tomorrow and if needed to keep. Andre [name] went to [facility] last week and was seen by MD with labs.  Per [name] recommendation was to continue to hold zometa at present. It was also recommended that he obtain an MRI of the pelvis to re-evaluate area of hx of fx due to new onset of pain.  Call returned and above discussed with plan to cancel appts for tomorrow. MRI will be scheduled.  Of note [name] states he left work early due to onset of " feeling woozy- and feel like I am not able to find my words well".  Per discussion - Andre [name]'s dtr is coming over to his home and if needed will take him to urgent care.  This RN discussed above with MD with recommendation to order MRI of head and pelvis- with head being urgent for evaluation of symptoms.  Per MRI department MRI can be performed at 8pm.  Reviewed above with MD with his recommendation for Andre [name] to proceed to urgent care for evaluation and then go for MRI.  All the above discussed with [name] who verbalized understanding. Of note he called urgent care and was told to go to the ER-  [name] states he feels symptoms are subsiding and his plan is to stay at home with his daughter present. If symptoms recur or worsen he will proceed to the ER or call 911.

## 2012-05-14 ENCOUNTER — Ambulatory Visit: Payer: PRIVATE HEALTH INSURANCE

## 2012-05-14 ENCOUNTER — Other Ambulatory Visit: Payer: PRIVATE HEALTH INSURANCE | Admitting: Lab

## 2012-05-14 ENCOUNTER — Telehealth: Payer: Self-pay | Admitting: *Deleted

## 2012-05-14 NOTE — Telephone Encounter (Signed)
Per review of MRI of head by MD recommendation is for pt to be seen by neurology.  This RN called to [name], spoke with Sedalia Muta and obtained an appt for this Friday at 830 with Dr Marjory Lies.  Called pt and discussed results and MD recommendation.  Andre [name] stated prior commitment per above ( his dtr has a surgical procedure at [facility] ) .  Per discussion Andre [name] will call neurology office to discuss appt as well as he states he would like a copy to be faxed to Dr Barnett Abu with whom " we are friends ".

## 2012-05-16 ENCOUNTER — Other Ambulatory Visit: Payer: Self-pay | Admitting: Oncology

## 2012-05-16 ENCOUNTER — Ambulatory Visit: Payer: PRIVATE HEALTH INSURANCE

## 2012-05-16 ENCOUNTER — Other Ambulatory Visit: Payer: PRIVATE HEALTH INSURANCE | Admitting: Lab

## 2012-05-16 ENCOUNTER — Other Ambulatory Visit: Payer: Self-pay | Admitting: *Deleted

## 2012-05-16 DIAGNOSIS — C9000 Multiple myeloma not having achieved remission: Secondary | ICD-10-CM

## 2012-05-16 MED ORDER — OMEPRAZOLE 40 MG PO CPDR: 40.0000 mg | DELAYED_RELEASE_CAPSULE | Freq: Every day | ORAL | Status: DC

## 2012-05-17 ENCOUNTER — Other Ambulatory Visit: Payer: Self-pay | Admitting: Oncology

## 2012-05-17 ENCOUNTER — Encounter: Payer: Self-pay | Admitting: Oncology

## 2012-05-30 ENCOUNTER — Telehealth: Payer: Self-pay | Admitting: *Deleted

## 2012-05-30 NOTE — Telephone Encounter (Signed)
Pt called to this RN to inform of current work up for abnormal mri of brain- He was seen today by Dr Anne Hahn and obtained additional studies as well as he is scheduled for spinal tap 05/30/2012.  Per Andre [name] possible dx include PML and MS. Andre [name] inquired about above in relation to his cancer diagnosis and therapy " have you ever seen this before? ".  This RN discussed with Andre [name] above work up and relation to his dx and chemo is not a known cause for either of these diagnosis  but due to surveillance by this office with pt's symptoms comorbid diagnosis are often found. Noted at present abnormal enhancement on mri of brain has not been diagnosed and current work up is appropriate.  Verbal support given to Andre [name] regard above potential diagnosis as well as request to keep this office aware of situation for appropriate coordination of care.  Andre [name] verbalized understanding and will keep in contact with this office.

## 2012-06-09 ENCOUNTER — Other Ambulatory Visit: Payer: Self-pay | Admitting: Oncology

## 2012-06-09 NOTE — Progress Notes (Signed)
Received a call from Lesia Sago. He tells me the blood and spinal fluid on Andre [name] was negative for JC virus antibodies by PCR. So luckily we are not dealing with progressive multifocal leukoencephalopathy.  On the other hand he did find the ACE level to be increased. He wonders if we are dealing with neurosarcoidosis. He is giving him a trial of prednisone. He will also send a chest x-ray to look for hilar adenopathy.

## 2012-06-10 ENCOUNTER — Telehealth: Payer: Self-pay | Admitting: *Deleted

## 2012-06-10 ENCOUNTER — Encounter: Payer: Self-pay | Admitting: *Deleted

## 2012-06-10 NOTE — Telephone Encounter (Signed)
Pt left message wanting to verify if this office is aware of current therapy by neurologist of daily prednisone 20mg  - He also inquired if he should keep appointments scheduled for lab and zometa tomorrow.  This RN returned call to pt's cell number and obtained identified vm- message left informing no noted correspondence per neurologist in chart. Noted steroid therapy will be documented. Regarding appointments for tomorrow - pt is to keep lab appt but zometa will be held at present until MD returns to office for review of current neuro status therapy and coordination of care.

## 2012-06-11 ENCOUNTER — Other Ambulatory Visit (HOSPITAL_BASED_OUTPATIENT_CLINIC_OR_DEPARTMENT_OTHER): Payer: PRIVATE HEALTH INSURANCE | Admitting: Lab

## 2012-06-11 ENCOUNTER — Ambulatory Visit: Payer: PRIVATE HEALTH INSURANCE

## 2012-06-11 DIAGNOSIS — C9000 Multiple myeloma not having achieved remission: Secondary | ICD-10-CM

## 2012-06-11 LAB — CBC WITH DIFFERENTIAL/PLATELET
BASO%: 0.2 % (ref 0.0–2.0)
Eosinophils Absolute: 0 10*3/uL (ref 0.0–0.5)
LYMPH%: 15.1 % (ref 14.0–49.0)
MCHC: 34.5 g/dL (ref 32.0–36.0)
MONO#: 0.3 10*3/uL (ref 0.1–0.9)
NEUT#: 4.9 10*3/uL (ref 1.5–6.5)
RBC: 3.31 10*6/uL — ABNORMAL LOW (ref 4.20–5.82)
RDW: 20.2 % — ABNORMAL HIGH (ref 11.0–14.6)
WBC: 6.2 10*3/uL (ref 4.0–10.3)
lymph#: 0.9 10*3/uL (ref 0.9–3.3)

## 2012-06-11 LAB — COMPREHENSIVE METABOLIC PANEL (CC13)
AST: 29 U/L (ref 5–34)
Alkaline Phosphatase: 57 U/L (ref 40–150)
Glucose: 105 mg/dl — ABNORMAL HIGH (ref 70–99)
Sodium: 137 mEq/L (ref 136–145)
Total Bilirubin: 0.5 mg/dL (ref 0.20–1.20)
Total Protein: 7.3 g/dL (ref 6.4–8.3)

## 2012-06-11 LAB — PROTEIN / CREATININE RATIO, URINE
Creatinine, Urine: 57.2 mg/dL
Total Protein, Urine: 6 mg/dL

## 2012-06-13 LAB — BETA 2 MICROGLOBULIN, SERUM: Beta-2 Microglobulin: 1.64 mg/L (ref 1.01–1.73)

## 2012-06-13 LAB — PROTEIN ELECTROPHORESIS, SERUM
Albumin ELP: 67.9 % — ABNORMAL HIGH (ref 55.8–66.1)
Alpha-1-Globulin: 3.4 % (ref 2.9–4.9)
Alpha-2-Globulin: 8.3 % (ref 7.1–11.8)
Beta Globulin: 5.7 % (ref 4.7–7.2)
Total Protein, Serum Electrophoresis: 7.3 g/dL (ref 6.0–8.3)

## 2012-06-18 ENCOUNTER — Other Ambulatory Visit: Payer: PRIVATE HEALTH INSURANCE | Admitting: Lab

## 2012-06-18 ENCOUNTER — Ambulatory Visit: Payer: PRIVATE HEALTH INSURANCE | Admitting: Oncology

## 2012-06-18 ENCOUNTER — Ambulatory Visit (HOSPITAL_BASED_OUTPATIENT_CLINIC_OR_DEPARTMENT_OTHER): Payer: PRIVATE HEALTH INSURANCE | Admitting: Oncology

## 2012-06-18 VITALS — BP 139/82 | HR 56 | Temp 98.0°F | Resp 20 | Ht 69.0 in | Wt 175.5 lb

## 2012-06-18 DIAGNOSIS — C9000 Multiple myeloma not having achieved remission: Secondary | ICD-10-CM

## 2012-06-18 MED ORDER — ZOLPIDEM TARTRATE 5 MG PO TABS: 5.0000 mg | ORAL_TABLET | Freq: Every evening | ORAL | Status: DC | PRN

## 2012-06-18 NOTE — Progress Notes (Signed)
ID: Andre [name]   DOB: 1950/12/17  MR#: [id number]  UVO#:[id number]  HISTORY OF PRESENT ILLNESS: The patient was worked up for peptic ulcer disease in August of 2011, with significant bleeding and anemia.  The patient was Helicobacter pylori negative.  He received some epinephrine when he had his EGD and then started on Protonix.  [name] anemia slowly resolved so that by September, his hemoglobin was up to 10.9 and by earlier this month, his hemoglobin was up to 12.5.    As part of his general workup, he was found to have a slightly elevated globulin fraction.  In September, his total protein was 8.3 with an albumin of 3.8.  In January, the total protein was 8.4 and albumin 3.6.  With persistence of this slight abnormality, Dr. Juanda Chance obtained serum immunofixation and SPEP.  The SPTP showed an M-spike of 2.67 grams.  A total IgG was 4,190.  Total IgA low at 28.  Total IgM low at 28 also.  The immunofixation showed a monoclonal IgG lambda paraprotein.  There were also monoclonal free lambda light chains present. With this information, the patient was referred for further evaluation. A diagnosis of myeloma was confirmed by bone marrow biopsy and subsequebnt treatment is as detailed below  INTERVAL HISTORY: Andre [name] returns today for followup of his multiple myeloma. Since the last visit here he was evaluated by neurology for possible JC virus encephalopathy and he was found to be negative in serum and CSF. On the other hand his ACE level was elevated in both. Dr. Anne Hahn' working diagnosis is primary CNS sarcoid. Andre [name] is on prednisone 20 mg daily currently.   REVIEW OF SYSTEMS: Aside from that he continues to have problems sleeping. He tells me Benadryl does not work for him anymore. He requested some Ambien which I was glad to write for him, suggesting nevertheless that he alternate with the Benadryl. He continues to exercise regularly, swimming 3-4 times a week and taking walks with some runs of the  other days. Currently he has no headaches no visual changes no nausea or vomiting photophobia or stiff neck. He has nocturia 2-3 times every night. He has mild constipation which is not a new problem for him. Otherwise a detailed review of systems today was noncontributory  PAST MEDICAL HISTORY: Past Medical History  Diagnosis Date  . Hypertension   . Hyperlipidemia   . Duodenal ulcer   . Multiple myeloma 07/04/2011  Significant for peptic ulcer disease as noted above.  History of hyperlipidemia.  History of anxiety and depression.  History of GERD which is significantly improved with weight loss and history of reactive airway disease, possibly secondary to the GERD which also improved with weight loss.  There was a history of obesity, now much improved secondary to exercise and diet.   PAST SURGICAL HISTORY: Past Surgical History  Procedure Date  . Cardiolite study 11/25/2003    NORMAL    FAMILY HISTORY Family History  Problem Relation Age of Onset  . Cancer Mother   . Hypertension Father   . Stroke Father   . Asthma Father   . Diabetes Father   [name] father died at the age of 53 following a stroke.  [name] mother died with cancer of the throat at the age of 5.   She had a history of depression and was a smoker.  Not clear how much alcohol she drank, according to the patient.  The patient had two brothers.  One died from  the age of 6 from a "kidney problem."  The second one died at the age of 10 from pancreatic cancer. (This may have been duodenal cancer. 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  His wife of 37 years is [name].  Unfortunately, they are in the process of divorce. The patient has two daughters:  Andre [name] who is 77, and Burciaga who is 71.  They both live here in [place]. 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  The patient is very close to his daughters.  There are no grandchildren.  The patient is not a church attender.  He does derive a great deal of support from friends at the "[employer]" which he attends very regularly, and also participates in Al-Anon even though actually there is no alcohol or drug problem in the family.  He participates in this group and he gets quite a bit of support from it.     ADVANCED DIRECTIVES:  HEALTH MAINTENANCE: History  Substance Use Topics  . Smoking status: Former Smoker -- 3 years    Quit date: 03/27/1969  . Smokeless tobacco: Never Used  . Alcohol Use: No     Colonoscopy:  PSA:  Lipid panel:  Allergies  Allergen Reactions  . Crestor (Rosuvastatin Calcium)   . Lipitor (Atorvastatin Calcium)     Current Outpatient Prescriptions  Medication Sig Dispense Refill  . aspirin 81 MG tablet Take 1 tablet (81 mg total) by mouth daily.  30 tablet    . ezetimibe (ZETIA) 10 MG tablet Take 1 tablet (10 mg total) by mouth daily.  30 tablet  0  . famciclovir (FAMVIR) 250 MG tablet Take 250 mg by mouth daily.       . fish oil-omega-3 fatty acids 1000 MG capsule Take 2 g by mouth daily.       . fluticasone (FLONASE) 50 MCG/ACT nasal spray Place 1 spray into the nose daily.  16 g  0  . guaiFENesin (MUCINEX) 600 MG 12 hr tablet Take 1,200 mg by mouth 2 (two) times daily.      [name] loratadine (CLARITIN) 10 MG tablet Take 1 tablet (10 mg total) by mouth daily.      [name] LORazepam (ATIVAN) 0.5 MG tablet TAKE 1/2 TO ONE TAB BY MOUTH TWICE DAILY  60 tablet  3  . omeprazole (PRILOSEC) 40 MG capsule Take 1 capsule (40 mg total) by mouth daily.  30 capsule  4  . predniSONE (DELTASONE) 20 MG tablet Take 20 mg by mouth daily.      . traZODone (DESYREL) 50 MG tablet Take 1 tablet (50 mg total) by mouth at bedtime.  30 tablet  3  . zolendronic acid (ZOMETA) 4 MG/5ML injection Inject 4 mg into the vein once.      [name] zolpidem (AMBIEN) 5 MG tablet Take 1 tablet (5 mg  total) by mouth at bedtime as needed for sleep.  60 tablet  4  . DISCONTD: fexofenadine (ALLEGRA) 60 MG tablet Take 180 mg by mouth daily.          OBJECTIVE: Middle-aged white male in no acute distress Filed Vitals:   06/18/12 1103  BP: 139/82  Pulse: 56  Temp: 98 F (36.7 C)  Resp: 20     Body mass index is 25.92 kg/(m^2).    ECOG  FS: 0  Sclerae unicteric Oropharynx clear No peripheral adenopathy Lungs no rales or rhonchi Heart regular rate and rhythm Abd benign MSK no focal spinal tenderness, no peripheral edema Neuro: nonfocal; alert and oriented x3; appropriate affect  LAB RESULTS:  SPEP shows no detectable M-spike (06/11/2012);   Results for NYXON, STRUPP (MRN [id number]) as of 06/18/2012 17:47  Ref. Range 01/25/2012 09:33 02/22/2012 08:34 03/07/2012 08:45 03/18/2012 09:47 04/10/2012 11:23  Kappa free light chain Latest Range: 0.33-1.94 mg/dL 4.09 (H) 8.11 (H) 9.14 (H) 4.69 (H) 2.89 (H)    Results for JOHNSON, ARIZOLA (MRN [id number]) as of 06/18/2012 17:47  Ref. Range 01/25/2012 09:33 02/22/2012 08:34 03/07/2012 08:45 03/18/2012 09:47 04/10/2012 11:23  Lambda Free Lght Chn Latest Range: 0.57-2.63 mg/dL 0.86 5.78 (H) 4.69 6.29 1.87     Lab Results  Component Value Date   WBC 6.2 06/11/2012   NEUTROABS 4.9 06/11/2012   HGB 12.3* 06/11/2012   HCT 35.7* 06/11/2012   MCV 107.9* 06/11/2012   PLT 118* 06/11/2012      Chemistry      Component Value Date/Time   NA 137 06/11/2012 1257   NA 140 04/10/2012 1123   K 4.9 06/11/2012 1257   K 4.2 04/10/2012 1123   CL 103 06/11/2012 1257   CL 103 04/10/2012 1123   CO2 24 06/11/2012 1257   CO2 26 04/10/2012 1123   BUN 23.0 06/11/2012 1257   BUN 16 04/10/2012 1123   CREATININE 1.3 06/11/2012 1257   CREATININE 1.49* 04/10/2012 1123      Component Value Date/Time   CALCIUM 10.4 06/11/2012 1257   CALCIUM 9.9 04/10/2012 1123   ALKPHOS 57 06/11/2012 1257   ALKPHOS 63 04/10/2012 1123   AST 29 06/11/2012 1257   AST 53* 04/10/2012 1123   ALT 33 06/11/2012  1257   ALT 30 04/10/2012 1123   BILITOT 0.50 06/11/2012 1257   BILITOT 0.5 04/10/2012 1123       No results found for this basename: LABCA2    No components found with this basename: LABCA125    No results found for this basename: INR:1;PROTIME:1 in the last 168 hours  Urinalysis    Component Value Date/Time   COLORURINE YELLOW 05/02/2010 0957   APPEARANCEUR CLEAR 05/02/2010 0957   LABSPEC 1.024 05/02/2010 0957   PHURINE 5.5 05/02/2010 0957   GLUCOSEU NEGATIVE 05/02/2010 0957   HGBUR NEGATIVE 05/02/2010 0957   BILIRUBINUR NEGATIVE 05/02/2010 0957   KETONESUR 15* 05/02/2010 0957   PROTEINUR NEGATIVE 05/02/2010 0957   UROBILINOGEN 0.2 05/02/2010 0957   NITRITE NEGATIVE 05/02/2010 0957   LEUKOCYTESUR NEGATIVE MICROSCOPIC NOT DONE ON URINES WITH NEGATIVE PROTEIN, BLOOD, LEUKOCYTES, NITRITE, OR GLUCOSE <1000 mg/dL. 05/02/2010 0957   Results for AVANTE, CARNEIRO (MRN [id number]) as of 04/16/2012 14:53  Ref. Range 12/28/2011 08:44 01/25/2012 09:33 02/22/2012 08:33 03/18/2012 09:47 04/10/2012 11:23  PROTEIN CREATININE RATIO Latest Range: <0.15  0.10 * * 0.09 0.09   STUDIES: Mr [name] Wo Contrast  03/28/2012  *RADIOLOGY REPORT*  Clinical Data:  Diplopia and dizziness.  Multiple myeloma.  MRA HEAD WITHOUT CONTRAST  Technique:  Angiographic images of the Circle of Willis were obtained using MRA technique without intravenous contrast.  Comparison:  MRI head 03/27/2012  Findings:  Both vertebral arteries are patent to the basilar.  PICA is patent bilaterally.  Basilar is widely patent.  Superior cerebellar and posterior cerebral arteries are patent bilaterally.  Internal carotid artery is patent bilaterally without significant stenosis.  Anterior and middle cerebral  arteries are patent bilaterally without stenosis.  Negative for cerebral aneurysm.  IMPRESSION: Negative  *RADIOLOGY REPORT*  Clinical Data:  Diplopia and dizziness.  MRA NECK WITHOUT AND WITH CONTRAST  Technique:  Angiographic images of the neck were  obtained using MRA technique without and with intravenous contrast.  Carotid stenosis measurements (when applicable) are obtained utilizing NASCET criteria, using the distal internal carotid diameter as the denominator.  Contrast: 17mL MULTIHANCE GADOBENATE DIMEGLUMINE 529 MG/ML IV SOLN  Comparison:  MRI head 03/27/2012  Findings:  Right carotid:  No significant carotid stenosis. Bifurcation is widely patent.  Left carotid:  No significant carotid stenosis or dissection. Bifurcation widely patent.  Vertebral arteries:  Decreased signal in the proximal left vertebral artery may be due to stenosis or tortuosity and artifact. Right vertebral artery is dominant and  widely patent to the basilar.  Left vertebral is patent to the basilar.  IMPRESSION: No significant carotid stenosis.  Possible stenosis versus artifact in the proximal left vertebral artery.  Original Report Authenticated By: Camelia Phenes, M.D.   Mr Angiogram Neck W Wo Contrast  03/28/2012  *RADIOLOGY REPORT*  Clinical Data:  Diplopia and dizziness.  Multiple myeloma.  MRA HEAD WITHOUT CONTRAST  Technique:  Angiographic images of the Circle of Willis were obtained using MRA technique without intravenous contrast.  Comparison:  MRI head 03/27/2012  Findings:  Both vertebral arteries are patent to the basilar.  PICA is patent bilaterally.  Basilar is widely patent.  Superior cerebellar and posterior cerebral arteries are patent bilaterally.  Internal carotid artery is patent bilaterally without significant stenosis.  Anterior and middle cerebral arteries are patent bilaterally without stenosis.  Negative for cerebral aneurysm.  IMPRESSION: Negative  *RADIOLOGY REPORT*  Clinical Data:  Diplopia and dizziness.  MRA NECK WITHOUT AND WITH CONTRAST  Technique:  Angiographic images of the neck were obtained using MRA technique without and with intravenous contrast.  Carotid stenosis measurements (when applicable) are obtained utilizing NASCET criteria, using  the distal internal carotid diameter as the denominator.  Contrast: 17mL MULTIHANCE GADOBENATE DIMEGLUMINE 529 MG/ML IV SOLN  Comparison:  MRI head 03/27/2012  Findings:  Right carotid:  No significant carotid stenosis. Bifurcation is widely patent.  Left carotid:  No significant carotid stenosis or dissection. Bifurcation widely patent.  Vertebral arteries:  Decreased signal in the proximal left vertebral artery may be due to stenosis or tortuosity and artifact. Right vertebral artery is dominant and  widely patent to the basilar.  Left vertebral is patent to the basilar.  IMPRESSION: No significant carotid stenosis.  Possible stenosis versus artifact in the proximal left vertebral artery.  Original Report Authenticated By: Camelia Phenes, M.D.   Mr Berlon Wirthlin Wo Contrast  03/27/2012  *RADIOLOGY REPORT*  Clinical Data: 61 year old male with multiple myeloma.  Blurred vision.  MRI HEAD WITHOUT AND WITH CONTRAST  Technique:  Multiplanar, multiecho pulse sequences of the brain and surrounding structures were obtained according to standard protocol without and with intravenous contrast  Contrast: 17mL MULTIHANCE GADOBENATE DIMEGLUMINE 529 MG/ML IV SOLN  Comparison: None.  Findings: Bone marrow signal is heterogeneous but overall appears to be within normal limits throughout the visualized skull and upper cervical spine.  No restricted diffusion to suggest acute infarction.  No midline shift, mass effect, evidence of mass lesion, ventriculomegaly, extra-axial collection or acute intracranial hemorrhage. Cervicomedullary junction and pituitary are within normal limits. Major intracranial vascular flow voids are preserved.  Mild for age cerebral white matter T2 and FLAIR hyperintensity, mostly  periventricular.  No abnormal enhancement of the brain.  No abnormal dural thickening or enhancement identified.  Negative visualized cervical spine.  Visualized orbit soft tissues are within normal limits.  Optic chiasm is within  normal limits. Mild ethmoid sinus mucosal thickening.  Other Visualized paranasal sinuses and mastoids are clear.  Negative scalp soft tissues.  IMPRESSION: 1. No acute or metastatic intracranial abnormality. 2.  Mildly heterogeneous but otherwise essentially normal skull bone marrow signal without strong evidence of active myelomatous involvement.  Original Report Authenticated By: Harley Hallmark, M.D.   Nm Cardiac Muga Rest  04/10/2012  *RADIOLOGY REPORT*  Clinical Data: Multiple myeloma, receiving chemotherapy  NUCLEAR MEDICINE CARDIAC MULTIPLE UPTAKE GATED ACQUISITION SCAN  Technique: Radiolabeled red blood cells used to perform resting radionuclide ventriculography. Imaging performed in the anterior, LAO, and lateral projections.Resting left ventricular ejection fraction estimated after drawing region of interest curves around the left ventricle during systole and diastole.  Radiopharmaceutical: 24.8MILLI CURIE ULTRATAG TECHNETIUM TC 67M- LABELED RED BLOOD CELLS IV KIT  Comparison: None available  Findings: Resting left ventricular ejection fraction is estimated at 53%.  No gross wall motion abnormality.  IMPRESSION: Estimated EF 53%  Original Report Authenticated By: Judie Petit. Ruel Favors, M.D.    ASSESSMENT: 61 y.o.  [place] man with a history of multiple myeloma diagnosed February of 2012, with an initial M-spike of 2.6 g/dL, IFE showing an IgG lambda paraprotein and free lambda light chains in the urine. Cytogenetics showed trisomy 87. Bone marrow biopsy showed a 22% plasmacytosis. Treated with   (1) bortezomib (subcutaneously), lenalidomide, and dexamethasone, with repeat bone marrow biopsy may of 2012 showing 10% plasmacytosis   (2) high-dose chemotherapy with BCNU and melphalan at [facility], followed by stem cell rescue July of 2012   (3) on monthly zoledronic acid started December of 2012  (4) low-dose lenalidomide resumed April 2013, interrupted several times since  (5) CNS symptoms and  abnormal brain MRI being evaluated by neurology   PLAN: I do not have a copy of Dr. Clarisa Kindred final report so I am not sure how long he plans to keep Andre [name] on prednisone before repeating a brain MRI, if that is his plan. In the meantime I would not want to resume Revlimid. Luckily at this point there is no evidence of disease progression, and we can simply wait on until neurologic evaluation is finalized. Andre [name] will return here October 22 for his every three-month zoledronic acid infusion. He will see [place] again in early December, but he knows to call for any problems that may develop before that visit.  I am not sure exactly what was going on with Andre [name] a few weeks ago, when he was feeling of so poorly, but his entire workup was negative and he seems to be on demand. I have asked him to not resume lenalidomide at this point, since he really has an appointment with Dr. as bread oh in a month and that way he she can evaluate them off treatment and decide again about whether she wants him back on lenalidomide and at which the os. Note that he remains constipated even though he is not on that medication. I have asked him to drink at least 2 quarts of water daily. His creatinine has risen slightly, to 1.49, doubtless due to the contrast load he received earlier this month, possibly also do to dehydration. I am holding zoledronic acid on total lab results.  We will continue to check his lab work on a monthly basis. He  will see me again late October. He knows to call for any problems that may develop before that visit.  Corbin Hott C    06/18/2012

## 2012-06-20 ENCOUNTER — Telehealth: Payer: Self-pay | Admitting: *Deleted

## 2012-06-20 NOTE — Telephone Encounter (Signed)
Gave patient appointment for 08-12-2012 lab only 10:00am   Gave patient appointment for 08-20-2012 md 11:00am

## 2012-07-02 ENCOUNTER — Telehealth: Payer: Self-pay | Admitting: *Deleted

## 2012-07-02 NOTE — Telephone Encounter (Signed)
Per staff message I have adjusted appts for 10/22.  JMW

## 2012-07-09 ENCOUNTER — Other Ambulatory Visit: Payer: PRIVATE HEALTH INSURANCE | Admitting: Lab

## 2012-07-09 ENCOUNTER — Ambulatory Visit (HOSPITAL_BASED_OUTPATIENT_CLINIC_OR_DEPARTMENT_OTHER): Payer: PRIVATE HEALTH INSURANCE

## 2012-07-09 ENCOUNTER — Other Ambulatory Visit (HOSPITAL_BASED_OUTPATIENT_CLINIC_OR_DEPARTMENT_OTHER): Payer: PRIVATE HEALTH INSURANCE | Admitting: Lab

## 2012-07-09 ENCOUNTER — Ambulatory Visit: Payer: PRIVATE HEALTH INSURANCE

## 2012-07-09 VITALS — BP 139/83 | HR 92 | Temp 97.9°F

## 2012-07-09 DIAGNOSIS — Z5112 Encounter for antineoplastic immunotherapy: Secondary | ICD-10-CM

## 2012-07-09 DIAGNOSIS — C9000 Multiple myeloma not having achieved remission: Secondary | ICD-10-CM

## 2012-07-09 LAB — PROTEIN / CREATININE RATIO, URINE: Protein Creatinine Ratio: 0.06 (ref <0.15)

## 2012-07-09 LAB — COMPREHENSIVE METABOLIC PANEL (CC13)
ALT: 32 U/L (ref 0–55)
AST: 30 U/L (ref 5–34)
Alkaline Phosphatase: 44 U/L (ref 40–150)
Sodium: 137 mEq/L (ref 136–145)
Total Bilirubin: 0.7 mg/dL (ref 0.20–1.20)
Total Protein: 6.8 g/dL (ref 6.4–8.3)

## 2012-07-09 LAB — CBC WITH DIFFERENTIAL/PLATELET
Basophils Absolute: 0 10*3/uL (ref 0.0–0.1)
Eosinophils Absolute: 0 10*3/uL (ref 0.0–0.5)
HGB: 12.7 g/dL — ABNORMAL LOW (ref 13.0–17.1)
LYMPH%: 10.5 % — ABNORMAL LOW (ref 14.0–49.0)
MCH: 36.9 pg — ABNORMAL HIGH (ref 27.2–33.4)
MCV: 107.3 fL — ABNORMAL HIGH (ref 79.3–98.0)
MONO%: 3.2 % (ref 0.0–14.0)
NEUT#: 5.7 10*3/uL (ref 1.5–6.5)
Platelets: 106 10*3/uL — ABNORMAL LOW (ref 140–400)
RBC: 3.44 10*6/uL — ABNORMAL LOW (ref 4.20–5.82)

## 2012-07-09 MED ORDER — ZOLEDRONIC ACID 4 MG/5ML IV CONC
4.0000 mg | Freq: Once | INTRAVENOUS | Status: AC
  Administered 2012-07-09: 4 mg via INTRAVENOUS
  Filled 2012-07-09: qty 5

## 2012-07-09 MED ORDER — SODIUM CHLORIDE 0.9 % IJ SOLN
3.0000 mL | Freq: Once | INTRAMUSCULAR | Status: DC | PRN
  Filled 2012-07-09: qty 10

## 2012-07-09 MED ORDER — SODIUM CHLORIDE 0.9 % IV SOLN
INTRAVENOUS | Status: DC
  Administered 2012-07-09: 09:00:00 via INTRAVENOUS

## 2012-07-09 NOTE — Patient Instructions (Addendum)
Zometa (Zoledronic Acid) What is this medicine? ZOLEDRONIC ACID (ZOE le dron ik AS id) lowers the amount of calcium loss from bone. It is used to treat too much calcium in your blood from cancer. It is also used to prevent complications of cancer that has spread to the bone. This medicine may be used for other purposes; ask your health care provider or pharmacist if you have questions.  What should I tell my health care provider before I take this medicine? They need to know if you have any of these conditions: -aspirin-sensitive asthma -dental disease -kidney disease -an unusual or allergic reaction to zoledronic acid, other medicines, foods, dyes, or preservatives -pregnant or trying to get pregnant -breast-feeding  How should I use this medicine? This medicine is for infusion into a vein. It is given by a health care professional in a hospital or clinic setting. Talk to your pediatrician regarding the use of this medicine in children. Special care may be needed. Overdosage: If you think you have taken too much of this medicine contact a poison control center or emergency room at once. NOTE: This medicine is only for you. Do not share this medicine with others. What if I miss a dose? It is important not to miss your dose. Call your doctor or health care professional if you are unable to keep an appointment.  What may interact with this medicine? -certain antibiotics given by injection -NSAIDs, medicines for pain and inflammation, like ibuprofen or naproxen -some diuretics like bumetanide, furosemide -teriparatide -thalidomide This list may not describe all possible interactions. Give your health care provider a list of all the medicines, herbs, non-prescription drugs, or dietary supplements you use. Also tell them if you smoke, drink alcohol, or use illegal drugs. Some items may interact with your medicine.  What should I watch for while using this medicine? Visit your doctor or  health care professional for regular checkups. It may be some time before you see the benefit from this medicine. Do not stop taking your medicine unless your doctor tells you to. Your doctor may order blood tests or other tests to see how you are doing. Women should inform their doctor if they wish to become pregnant or think they might be pregnant. There is a potential for serious side effects to an unborn child. Talk to your health care professional or pharmacist for more information. You should make sure that you get enough calcium and vitamin D while you are taking this medicine. Discuss the foods you eat and the vitamins you take with your health care professional. Some people who take this medicine have severe bone, joint, and/or muscle pain. This medicine may also increase your risk for a broken thigh bone. Tell your doctor right away if you have pain in your upper leg or groin. Tell your doctor if you have any pain that does not go away or that gets worse.  What side effects may I notice from receiving this medicine? Side effects that you should report to your doctor or health care professional as soon as possible: -allergic reactions like skin rash, itching or hives, swelling of the face, lips, or tongue -anxiety, confusion, or depression -breathing problems -changes in vision -feeling faint or lightheaded, falls -jaw burning, cramping, pain -muscle cramps, stiffness, or weakness -trouble passing urine or change in the amount of urine  Side effects that usually do not require medical attention (report to your doctor or health care professional if they continue or are bothersome): -bone,   joint, or muscle pain -fever -hair loss -irritation at site where injected -loss of appetite -nausea, vomiting -stomach upset -tired This list may not describe all possible side effects. Call your doctor for medical advice about side effects. You may report side effects to FDA at  1-800-FDA-1088. Where should I keep my medicine? This drug is given in a hospital or clinic and will not be stored at home. NOTE: This sheet is a summary. It may not cover all possible information. If you have questions about this medicine, talk to your doctor, pharmacist, or health care provider.  2012, [employer]vier/Gold Standard. (03/03/2011 9:06:58 AM) 

## 2012-07-11 LAB — KAPPA/LAMBDA LIGHT CHAINS
Kappa free light chain: 0.85 mg/dL (ref 0.33–1.94)
Kappa:Lambda Ratio: 1.42 (ref 0.26–1.65)
Lambda Free Lght Chn: 0.6 mg/dL (ref 0.57–2.63)

## 2012-07-11 LAB — SPEP & IFE WITH QIG
Albumin ELP: 68.8 % — ABNORMAL HIGH (ref 55.8–66.1)
Alpha-2-Globulin: 7.9 % (ref 7.1–11.8)
Beta Globulin: 6.2 % (ref 4.7–7.2)
IgA: 86 mg/dL (ref 68–379)
Total Protein, Serum Electrophoresis: 6.5 g/dL (ref 6.0–8.3)

## 2012-08-12 ENCOUNTER — Encounter: Payer: Self-pay | Admitting: Oncology

## 2012-08-12 ENCOUNTER — Other Ambulatory Visit: Payer: PRIVATE HEALTH INSURANCE | Admitting: Lab

## 2012-08-12 DIAGNOSIS — C9000 Multiple myeloma not having achieved remission: Secondary | ICD-10-CM

## 2012-08-12 DIAGNOSIS — K5900 Constipation, unspecified: Secondary | ICD-10-CM

## 2012-08-12 LAB — PROTEIN / CREATININE RATIO, URINE: Total Protein, Urine: <3 mg/dL

## 2012-08-12 LAB — COMPREHENSIVE METABOLIC PANEL (CC13)
Albumin: 3.9 g/dL (ref 3.5–5.0)
CO2: 26 mEq/L (ref 22–29)
Calcium: 10.1 mg/dL (ref 8.4–10.4)
Glucose: 105 mg/dl — ABNORMAL HIGH (ref 70–99)
Potassium: 4.3 mEq/L (ref 3.5–5.1)
Sodium: 139 mEq/L (ref 136–145)
Total Protein: 6.8 g/dL (ref 6.4–8.3)

## 2012-08-12 LAB — CBC WITH DIFFERENTIAL/PLATELET
Basophils Absolute: 0 10*3/uL (ref 0.0–0.1)
Eosinophils Absolute: 0 10*3/uL (ref 0.0–0.5)
HGB: 13.1 g/dL (ref 13.0–17.1)
MONO#: 0.2 10*3/uL (ref 0.1–0.9)
NEUT#: 7.8 10*3/uL — ABNORMAL HIGH (ref 1.5–6.5)
Platelets: 119 10*3/uL — ABNORMAL LOW (ref 140–400)
RBC: 3.56 10*6/uL — ABNORMAL LOW (ref 4.20–5.82)
RDW: 14.7 % — ABNORMAL HIGH (ref 11.0–14.6)
WBC: 8.7 10*3/uL (ref 4.0–10.3)

## 2012-08-16 LAB — KAPPA/LAMBDA LIGHT CHAINS: Kappa free light chain: 1.17 mg/dL (ref 0.33–1.94)

## 2012-08-16 LAB — SPEP & IFE WITH QIG
Albumin ELP: 66 % (ref 55.8–66.1)
Alpha-1-Globulin: 3.9 % (ref 2.9–4.9)
Alpha-2-Globulin: 9.1 % (ref 7.1–11.8)
Beta Globulin: 6.9 % (ref 4.7–7.2)
Total Protein, Serum Electrophoresis: 7.1 g/dL (ref 6.0–8.3)

## 2012-08-16 LAB — BETA 2 MICROGLOBULIN, SERUM: Beta-2 Microglobulin: 1.71 mg/L (ref 1.01–1.73)

## 2012-08-20 ENCOUNTER — Ambulatory Visit (HOSPITAL_BASED_OUTPATIENT_CLINIC_OR_DEPARTMENT_OTHER): Payer: PRIVATE HEALTH INSURANCE | Admitting: Oncology

## 2012-08-20 ENCOUNTER — Telehealth: Payer: Self-pay | Admitting: *Deleted

## 2012-08-20 VITALS — BP 136/80 | HR 57 | Temp 97.5°F | Resp 20 | Ht 69.0 in | Wt 178.9 lb

## 2012-08-20 DIAGNOSIS — C9000 Multiple myeloma not having achieved remission: Secondary | ICD-10-CM

## 2012-08-20 NOTE — Telephone Encounter (Signed)
Per staff message and POF I have scheduled appt.  JMW  

## 2012-08-20 NOTE — Progress Notes (Signed)
ID: DOMINIQ FONTAINE   DOB: 1951-05-03  MR#: [id number]  BMW#:[id number]   PCP: Sonda Primes SU: OTHER MD: Lesia Sago, Eddie Candle  HISTORY OF PRESENT ILLNESS: The patient was worked up for peptic ulcer disease in August of 2011, with significant bleeding and anemia.  The patient was Helicobacter pylori negative.  He received some epinephrine when he had his EGD and then started on Protonix.  [name] anemia slowly resolved so that by September, his hemoglobin was up to 10.9 and by earlier this month, his hemoglobin was up to 12.5.    As part of his general workup, he was found to have a slightly elevated globulin fraction.  In September, his total protein was 8.3 with an albumin of 3.8.  In January, the total protein was 8.4 and albumin 3.6.  With persistence of this slight abnormality, Dr. Juanda Chance obtained serum immunofixation and SPEP.  The SPTP showed an M-spike of 2.67 grams.  A total IgG was 4,190.  Total IgA low at 28.  Total IgM low at 28 also.  The immunofixation showed a monoclonal IgG lambda paraprotein.  There were also monoclonal free lambda light chains present. With this information, the patient was referred for further evaluation. A diagnosis of myeloma was confirmed by bone marrow biopsy and subsequebnt treatment is as detailed below  INTERVAL HISTORY: [name] returns today for followup of his multiple myeloma. In the interval since his last visit here he was evaluated by Dr. Orlean Patten read a at [facility]. Her feeling is that we should continue off Revlimid for now, at least until the neurologic problem gets completely clarified (there is a second opinion at [facility] pending).   REVIEW OF SYSTEMS: [name] is pretty much back to baseline. He is swimming daily and taking walks with his dog (he is not jogging currently). He is working full-time. He has gotten used to it is new apartment, which can be noisy at night, and is sleeping better. He has a little discomfort in his right shoulder  sometimes, but there have been no fevers, no bleeding, no rash, and no weight loss. He had a good time in Thanksgiving's, in his looking forward to the Christmas and new year holidays. He tolerated his zoledronic acid in October without event. A detailed review of systems was otherwise noncontributory.  PAST MEDICAL HISTORY: Past Medical History  Diagnosis Date  . Hypertension   . Hyperlipidemia   . Duodenal ulcer   . Multiple myeloma 07/04/2011  Significant for peptic ulcer disease as noted above.  History of hyperlipidemia.  History of anxiety and depression.  History of GERD which is significantly improved with weight loss and history of reactive airway disease, possibly secondary to the GERD which also improved with weight loss.  There was a history of obesity, now much improved secondary to exercise and diet.   PAST SURGICAL HISTORY: Past Surgical History  Procedure Date  . Cardiolite study 11/25/2003    NORMAL    FAMILY HISTORY Family History  Problem Relation Age of Onset  . Cancer Mother   . Hypertension Father   . Stroke Father   . Asthma Father   . Diabetes Father   [name] father died at the age of 33 following a stroke.  [name] mother died with cancer of the throat at the age of 43.   She had a history of depression and was a smoker.  Not clear how much alcohol she drank, according to the patient.  The patient had  two brothers.  One died from the age of 6 from a "kidney problem."  The second one died at the age of 63 from pancreatic cancer. (This may have been duodenal cancer. The patient is not sure.)  SOCIAL HISTORY: [name] works as a Surveyor, minerals.  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  His wife of 37 years is [name].  Unfortunately, they are in the process of divorce. The patient has two daughters:  [name] who is 64, and Petrovich who is 64.  They both  live here in [place]. 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  The patient is very close to his daughters.  There are no grandchildren.  The patient is not a church attender.  He does derive a great deal of support from friends at the "[employer]" which he attends very regularly, and also participates in Al-Anon even though actually there is no alcohol or drug problem in the family.  He participates in this group and he gets quite a bit of support from it.      ADVANCED DIRECTIVES: In place  HEALTH MAINTENANCE: History  Substance Use Topics  . Smoking status: Former Smoker -- 3 years    Quit date: 03/27/1969  . Smokeless tobacco: Never Used  . Alcohol Use: No     Colonoscopy:  PSA:  Lipid panel:  Allergies  Allergen Reactions  . Crestor (Rosuvastatin Calcium)   . Lipitor (Atorvastatin Calcium)     Current Outpatient Prescriptions  Medication Sig Dispense Refill  . aspirin 81 MG tablet Take 1 tablet (81 mg total) by mouth daily.  30 tablet    . ezetimibe (ZETIA) 10 MG tablet Take 1 tablet (10 mg total) by mouth daily.  30 tablet  0  . famciclovir (FAMVIR) 250 MG tablet Take 250 mg by mouth daily.       . fish oil-omega-3 fatty acids 1000 MG capsule Take 2 g by mouth daily.       . fluticasone (FLONASE) 50 MCG/ACT nasal spray Place 1 spray into the nose daily.  16 g  0  . guaiFENesin (MUCINEX) 600 MG 12 hr tablet Take 1,200 mg by mouth 2 (two) times daily.      [name] loratadine (CLARITIN) 10 MG tablet Take 1 tablet (10 mg total) by mouth daily.      [name] LORazepam (ATIVAN) 0.5 MG tablet TAKE 1/2 TO ONE TAB BY MOUTH TWICE DAILY  60 tablet  3  . omeprazole (PRILOSEC) 40 MG capsule Take 1 capsule (40 mg total) by mouth daily.  30 capsule  4  . predniSONE (DELTASONE) 20 MG tablet Take 20 mg by mouth daily.      . traZODone (DESYREL) 50 MG tablet Take 1 tablet (50 mg total) by mouth at bedtime.  30 tablet  3  . zolendronic acid (ZOMETA) 4 MG/5ML injection Inject 4 mg into the vein once.      [name] zolpidem  (AMBIEN) 5 MG tablet Take 1 tablet (5 mg total) by mouth at bedtime as needed for sleep.  60 tablet  4  . [DISCONTINUED] fexofenadine (ALLEGRA) 60 MG tablet Take 180 mg by mouth daily.          OBJECTIVE: Middle-aged white male in no acute distress Filed Vitals:   08/20/12 1111  BP: 136/80  Pulse: 57  Temp: 97.5 F (36.4 C)  Resp: 20     Body  mass index is 26.42 kg/(m^2).    ECOG FS: 0  Sclerae unicteric Oropharynx clear No peripheral adenopathy Lungs no rales or rhonchi Heart regular rate and rhythm Abd benign MSK no focal spinal tenderness, no peripheral edema; good strength on abduction both upper extremities Neuro: nonfocal; alert and oriented x3; positive affect  LAB RESULTS:  SPEP shows no monoclonal bands; his lambda light chains and beta-2 microglobulin levels are normal; he has a normal IgG and IgM level, slightly low IgA level   Lab Results  Component Value Date   WBC 8.7 08/12/2012   NEUTROABS 7.8* 08/12/2012   HGB 13.1 08/12/2012   HCT 38.9 08/12/2012   MCV 109.4* 08/12/2012   PLT 119* 08/12/2012      Chemistry      Component Value Date/Time   NA 139 08/12/2012 1018   NA 140 04/10/2012 1123   K 4.3 08/12/2012 1018   K 4.2 04/10/2012 1123   CL 105 08/12/2012 1018   CL 103 04/10/2012 1123   CO2 26 08/12/2012 1018   CO2 26 04/10/2012 1123   BUN 20.0 08/12/2012 1018   BUN 16 04/10/2012 1123   CREATININE 1.2 08/12/2012 1018   CREATININE 1.49* 04/10/2012 1123      Component Value Date/Time   CALCIUM 10.1 08/12/2012 1018   CALCIUM 9.9 04/10/2012 1123   ALKPHOS 46 08/12/2012 1018   ALKPHOS 63 04/10/2012 1123   AST 24 08/12/2012 1018   AST 53* 04/10/2012 1123   ALT 24 08/12/2012 1018   ALT 30 04/10/2012 1123   BILITOT 0.36 08/12/2012 1018   BILITOT 0.5 04/10/2012 1123       No results found for this basename: LABCA2    No components found with this basename: LABCA125    No results found for this basename: INR:1;PROTIME:1 in the last 168  hours  Urinalysis    Component Value Date/Time   COLORURINE YELLOW 05/02/2010 0957   APPEARANCEUR CLEAR 05/02/2010 0957   LABSPEC 1.024 05/02/2010 0957   PHURINE 5.5 05/02/2010 0957   GLUCOSEU NEGATIVE 05/02/2010 0957   HGBUR NEGATIVE 05/02/2010 0957   BILIRUBINUR NEGATIVE 05/02/2010 0957   KETONESUR 15* 05/02/2010 0957   PROTEINUR NEGATIVE 05/02/2010 0957   UROBILINOGEN 0.2 05/02/2010 0957   NITRITE NEGATIVE 05/02/2010 0957   LEUKOCYTESUR NEGATIVE MICROSCOPIC NOT DONE ON URINES WITH NEGATIVE PROTEIN, BLOOD, LEUKOCYTES, NITRITE, OR GLUCOSE <1000 mg/dL. 05/02/2010 0957    STUDIES: Mr [name] Wo Contrast  03/28/2012  *RADIOLOGY REPORT*  Clinical Data:  Diplopia and dizziness.  Multiple myeloma.  MRA HEAD WITHOUT CONTRAST  Technique:  Angiographic images of the Circle of Willis were obtained using MRA technique without intravenous contrast.  Comparison:  MRI head 03/27/2012  Findings:  Both vertebral arteries are patent to the basilar.  PICA is patent bilaterally.  Basilar is widely patent.  Superior cerebellar and posterior cerebral arteries are patent bilaterally.  Internal carotid artery is patent bilaterally without significant stenosis.  Anterior and middle cerebral arteries are patent bilaterally without stenosis.  Negative for cerebral aneurysm.  IMPRESSION: Negative  *RADIOLOGY REPORT*  Clinical Data:  Diplopia and dizziness.  MRA NECK WITHOUT AND WITH CONTRAST  Technique:  Angiographic images of the neck were obtained using MRA technique without and with intravenous contrast.  Carotid stenosis measurements (when applicable) are obtained utilizing NASCET criteria, using the distal internal carotid diameter as the denominator.  Contrast: 17mL MULTIHANCE GADOBENATE DIMEGLUMINE 529 MG/ML IV SOLN  Comparison:  MRI head 03/27/2012  Findings:  Right carotid:  No significant carotid stenosis. Bifurcation is widely patent.  Left carotid:  No significant carotid stenosis or dissection. Bifurcation widely  patent.  Vertebral arteries:  Decreased signal in the proximal left vertebral artery may be due to stenosis or tortuosity and artifact. Right vertebral artery is dominant and  widely patent to the basilar.  Left vertebral is patent to the basilar.  IMPRESSION: No significant carotid stenosis.  Possible stenosis versus artifact in the proximal left vertebral artery.  Original Report Authenticated By: Camelia Phenes, M.D.   Mr Angiogram Neck W Wo Contrast  03/28/2012  *RADIOLOGY REPORT*  Clinical Data:  Diplopia and dizziness.  Multiple myeloma.  MRA HEAD WITHOUT CONTRAST  Technique:  Angiographic images of the Circle of Willis were obtained using MRA technique without intravenous contrast.  Comparison:  MRI head 03/27/2012  Findings:  Both vertebral arteries are patent to the basilar.  PICA is patent bilaterally.  Basilar is widely patent.  Superior cerebellar and posterior cerebral arteries are patent bilaterally.  Internal carotid artery is patent bilaterally without significant stenosis.  Anterior and middle cerebral arteries are patent bilaterally without stenosis.  Negative for cerebral aneurysm.  IMPRESSION: Negative  *RADIOLOGY REPORT*  Clinical Data:  Diplopia and dizziness.  MRA NECK WITHOUT AND WITH CONTRAST  Technique:  Angiographic images of the neck were obtained using MRA technique without and with intravenous contrast.  Carotid stenosis measurements (when applicable) are obtained utilizing NASCET criteria, using the distal internal carotid diameter as the denominator.  Contrast: 17mL MULTIHANCE GADOBENATE DIMEGLUMINE 529 MG/ML IV SOLN  Comparison:  MRI head 03/27/2012  Findings:  Right carotid:  No significant carotid stenosis. Bifurcation is widely patent.  Left carotid:  No significant carotid stenosis or dissection. Bifurcation widely patent.  Vertebral arteries:  Decreased signal in the proximal left vertebral artery may be due to stenosis or tortuosity and artifact. Right vertebral artery is  dominant and  widely patent to the basilar.  Left vertebral is patent to the basilar.  IMPRESSION: No significant carotid stenosis.  Possible stenosis versus artifact in the proximal left vertebral artery.  Original Report Authenticated By: Camelia Phenes, M.D.   Mr Stepfon Rawles Wo Contrast  03/27/2012  *RADIOLOGY REPORT*  Clinical Data: 61 year old male with multiple myeloma.  Blurred vision.  MRI HEAD WITHOUT AND WITH CONTRAST  Technique:  Multiplanar, multiecho pulse sequences of the brain and surrounding structures were obtained according to standard protocol without and with intravenous contrast  Contrast: 17mL MULTIHANCE GADOBENATE DIMEGLUMINE 529 MG/ML IV SOLN  Comparison: None.  Findings: Bone marrow signal is heterogeneous but overall appears to be within normal limits throughout the visualized skull and upper cervical spine.  No restricted diffusion to suggest acute infarction.  No midline shift, mass effect, evidence of mass lesion, ventriculomegaly, extra-axial collection or acute intracranial hemorrhage. Cervicomedullary junction and pituitary are within normal limits. Major intracranial vascular flow voids are preserved.  Mild for age cerebral white matter T2 and FLAIR hyperintensity, mostly periventricular.  No abnormal enhancement of the brain.  No abnormal dural thickening or enhancement identified.  Negative visualized cervical spine.  Visualized orbit soft tissues are within normal limits.  Optic chiasm is within normal limits. Mild ethmoid sinus mucosal thickening.  Other Visualized paranasal sinuses and mastoids are clear.  Negative scalp soft tissues.  IMPRESSION: 1. No acute or metastatic intracranial abnormality. 2.  Mildly heterogeneous but otherwise essentially normal skull bone marrow signal without strong evidence of active myelomatous involvement.  Original Report Authenticated By: Ulla Potash  III, M.D.   Nm Cardiac Muga Rest  04/10/2012  *RADIOLOGY REPORT*  Clinical Data: Multiple  myeloma, receiving chemotherapy  NUCLEAR MEDICINE CARDIAC MULTIPLE UPTAKE GATED ACQUISITION SCAN  Technique: Radiolabeled red blood cells used to perform resting radionuclide ventriculography. Imaging performed in the anterior, LAO, and lateral projections.Resting left ventricular ejection fraction estimated after drawing region of interest curves around the left ventricle during systole and diastole.  Radiopharmaceutical: 24.8MILLI CURIE ULTRATAG TECHNETIUM TC 77M- LABELED RED BLOOD CELLS IV KIT  Comparison: None available  Findings: Resting left ventricular ejection fraction is estimated at 53%.  No gross wall motion abnormality.  IMPRESSION: Estimated EF 53%  Original Report Authenticated By: Judie Petit. Ruel Favors, M.D.    ASSESSMENT: 62 y.o.  [place] man with a history of multiple myeloma diagnosed February of 2012, with an initial M-spike of 2.6 g/dL, IFE showing an IgG lambda paraprotein and free lambda light chains in the urine. Cytogenetics showed trisomy 39. Bone marrow biopsy showed a 22% plasmacytosis. 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 Anne Hahn   PLAN: [name] is doing great from a myeloma point of view, with no evidence of disease activity. He will be seen again at [facility] in January, and he will see me again late February. I am concerned regarding osteoporosis given his continuing use of steroids. He will have zoledronic acid again mid February, with repeat  lab work at that time. Otherwise she knows to call me for any problems that may develop before the next visit.  Garcia Dalzell C    08/20/2012

## 2012-08-20 NOTE — Telephone Encounter (Signed)
zometa feb 18 w labs, see me feb 25  Sent email to set up patients zometa

## 2012-08-22 ENCOUNTER — Telehealth: Payer: Self-pay | Admitting: Internal Medicine

## 2012-08-22 NOTE — Telephone Encounter (Signed)
The patient is hoping to become a new patient of Dr.[name]'s.  He states he reached out to Dr.Brodie to talk to Dr.[name] about adding him on.  Do you want him added?

## 2012-08-23 NOTE — Telephone Encounter (Signed)
Yes, pls Thx 

## 2012-08-23 NOTE — Telephone Encounter (Signed)
Pt scheduled for next available, thanks!

## 2012-08-23 NOTE — Telephone Encounter (Signed)
Thx

## 2012-08-27 ENCOUNTER — Other Ambulatory Visit: Payer: Self-pay | Admitting: Chiropractic Medicine

## 2012-08-27 ENCOUNTER — Ambulatory Visit
Admission: RE | Admit: 2012-08-27 | Discharge: 2012-08-27 | Disposition: A | Discharge status: Home / Self Care | Payer: PRIVATE HEALTH INSURANCE | Source: Ambulatory Visit | Attending: Chiropractic Medicine | Admitting: Chiropractic Medicine

## 2012-08-27 DIAGNOSIS — M25519 Pain in unspecified shoulder: Secondary | ICD-10-CM

## 2012-09-25 ENCOUNTER — Other Ambulatory Visit: Payer: Self-pay | Admitting: *Deleted

## 2012-09-25 DIAGNOSIS — C9000 Multiple myeloma not having achieved remission: Secondary | ICD-10-CM

## 2012-09-27 ENCOUNTER — Encounter: Payer: Self-pay | Admitting: Internal Medicine

## 2012-09-27 ENCOUNTER — Other Ambulatory Visit (INDEPENDENT_AMBULATORY_CARE_PROVIDER_SITE_OTHER): Payer: Commercial Managed Care - PPO

## 2012-09-27 ENCOUNTER — Ambulatory Visit (INDEPENDENT_AMBULATORY_CARE_PROVIDER_SITE_OTHER): Payer: Commercial Managed Care - PPO | Admitting: Internal Medicine

## 2012-09-27 VITALS — BP 170/80 | HR 80 | Temp 98.2°F | Resp 16 | Ht 69.0 in | Wt 185.0 lb

## 2012-09-27 DIAGNOSIS — F341 Dysthymic disorder: Secondary | ICD-10-CM

## 2012-09-27 DIAGNOSIS — G4700 Insomnia, unspecified: Secondary | ICD-10-CM

## 2012-09-27 DIAGNOSIS — R5381 Other malaise: Secondary | ICD-10-CM

## 2012-09-27 DIAGNOSIS — C9000 Multiple myeloma not having achieved remission: Secondary | ICD-10-CM

## 2012-09-27 DIAGNOSIS — F329 Major depressive disorder, single episode, unspecified: Secondary | ICD-10-CM

## 2012-09-27 DIAGNOSIS — Z0000 Encounter for general adult medical examination without abnormal findings: Secondary | ICD-10-CM

## 2012-09-27 DIAGNOSIS — R5383 Other fatigue: Secondary | ICD-10-CM

## 2012-09-27 DIAGNOSIS — Z7189 Other specified counseling: Secondary | ICD-10-CM | POA: Insufficient documentation

## 2012-09-27 LAB — HEPATIC FUNCTION PANEL
ALT: 26 U/L (ref 0–53)
AST: 25 U/L (ref 0–37)
Albumin: 3.9 g/dL (ref 3.5–5.2)
Alkaline Phosphatase: 38 U/L — ABNORMAL LOW (ref 39–117)
Bilirubin, Direct: 0.1 mg/dL (ref 0.0–0.3)
Total Bilirubin: 0.6 mg/dL (ref 0.3–1.2)
Total Protein: 6.2 g/dL (ref 6.0–8.3)

## 2012-09-27 LAB — URINALYSIS, ROUTINE W REFLEX MICROSCOPIC
Bilirubin Urine: NEGATIVE
Ketones, ur: NEGATIVE
Leukocytes, UA: NEGATIVE
Urobilinogen, UA: 0.2 (ref 0.0–1.0)

## 2012-09-27 LAB — TESTOSTERONE: Testosterone: 146.32 ng/dL — ABNORMAL LOW (ref 350.00–890.00)

## 2012-09-27 LAB — PSA: PSA: 0.46 ng/mL (ref 0.10–4.00)

## 2012-09-27 LAB — CORTISOL: Cortisol, Plasma: 6.4 ug/dL

## 2012-09-27 LAB — VITAMIN B12: Vitamin B-12: 884 pg/mL (ref 211–911)

## 2012-09-27 MED ORDER — ESCITALOPRAM OXALATE 10 MG PO TABS: 10.0000 mg | ORAL_TABLET | Freq: Every day | ORAL | Status: DC

## 2012-09-27 NOTE — Assessment & Plan Note (Signed)
Start Lexapro

## 2012-09-27 NOTE — Assessment & Plan Note (Signed)
He is getting care at [facility]

## 2012-09-27 NOTE — Assessment & Plan Note (Signed)
Continue with current prescription therapy as reflected on the Med list.  

## 2012-09-27 NOTE — Progress Notes (Signed)
  Subjective:     HPI   New patient presents for a wellness exam; he needs to address  chronic hypertension that has been well controlled with medicines; to address chronic fatigue, insomnia,  hyperlipidemia controlled with medicines as well; and to address MM, anxiety.  C/o depression.  BP Readings from Last 3 Encounters:  09/27/12 170/80  08/20/12 136/80  07/09/12 139/83   Wt Readings from Last 3 Encounters:  09/27/12 185 lb (83.915 kg)  08/20/12 178 lb 14.4 oz (81.149 kg)  06/18/12 175 lb 8 oz (79.606 kg)       Review of Systems  Constitutional: Positive for fatigue. Negative for diaphoresis, appetite change and unexpected weight change.  HENT: Negative for nosebleeds, congestion, sore throat, facial swelling, sneezing, trouble swallowing and neck pain.   Eyes: Negative for itching and visual disturbance.  Respiratory: Negative for cough, chest tightness and wheezing.   Cardiovascular: Negative for chest pain, palpitations and leg swelling.  Gastrointestinal: Negative for nausea, vomiting, diarrhea, blood in stool and abdominal distention.  Genitourinary: Negative for dysuria, urgency, frequency and hematuria.  Musculoskeletal: Negative for back pain, joint swelling and gait problem.  Skin: Negative for rash.  Neurological: Positive for weakness. Negative for dizziness, tremors and speech difficulty.  Psychiatric/Behavioral: Positive for dysphoric mood. Negative for suicidal ideas, sleep disturbance and agitation. The patient is nervous/anxious.        Objective:   Physical Exam  Constitutional: He is oriented to person, place, and time. He appears well-developed. No distress.       Appears tired  HENT:  Mouth/Throat: Oropharynx is clear and moist.  Eyes: Conjunctivae normal are normal. Pupils are equal, round, and reactive to light.  Neck: Normal range of motion. No JVD present. No thyromegaly present.  Cardiovascular: Normal rate, regular rhythm, normal heart  sounds and intact distal pulses.  Exam reveals no gallop and no friction rub.   No murmur heard. Pulmonary/Chest: Effort normal and breath sounds normal. No respiratory distress. He has no wheezes. He has no rales. He exhibits no tenderness.  Abdominal: Soft. Bowel sounds are normal. He exhibits no distension and no mass. There is no tenderness. There is no rebound and no guarding.  Musculoskeletal: Normal range of motion. He exhibits no edema and no tenderness.  Lymphadenopathy:    He has no cervical adenopathy.  Neurological: He is alert and oriented to person, place, and time. He has normal reflexes. No cranial nerve deficit. He exhibits normal muscle tone. Coordination normal.  Skin: Skin is warm and dry. No rash noted.  Psychiatric: He has a normal mood and affect. His behavior is normal. Judgment and thought content normal.    Lab Results  Component Value Date   WBC 8.7 08/12/2012   HGB 13.1 08/12/2012   HCT 38.9 08/12/2012   PLT 119* 08/12/2012   GLUCOSE 105* 08/12/2012   CHOL 147 03/28/2012   TRIG 60 03/28/2012   HDL 51 03/28/2012   LDLCALC 84 03/28/2012   ALT 26 09/27/2012   AST 25 09/27/2012   NA 139 08/12/2012   K 4.3 08/12/2012   CL 105 08/12/2012   CREATININE 1.2 08/12/2012   BUN 20.0 08/12/2012   CO2 26 08/12/2012   TSH 25.53* 09/27/2012   PSA 0.46 09/27/2012   INR 0.90 02/08/2011   INR 0.90 02/08/2011   HGBA1C 5.4 03/27/2012    Chart reviewed A complex case     Assessment & Plan:

## 2012-09-27 NOTE — Assessment & Plan Note (Signed)
2014 CFS - post chemo, bone marrow transplant -- multifactorial

## 2012-09-27 NOTE — Assessment & Plan Note (Signed)
We discussed age appropriate health related issues, including available/recomended screening tests and vaccinations. All questions were answered. 

## 2012-09-28 LAB — VITAMIN D 25 HYDROXY (VIT D DEFICIENCY, FRACTURES): Vit D, 25-Hydroxy: 29 ng/mL — ABNORMAL LOW (ref 30–89)

## 2012-09-30 ENCOUNTER — Other Ambulatory Visit: Payer: Self-pay | Admitting: *Deleted

## 2012-09-30 ENCOUNTER — Encounter: Payer: Self-pay | Admitting: Internal Medicine

## 2012-09-30 ENCOUNTER — Telehealth: Payer: Self-pay | Admitting: Internal Medicine

## 2012-09-30 DIAGNOSIS — E039 Hypothyroidism, unspecified: Secondary | ICD-10-CM

## 2012-09-30 DIAGNOSIS — C9000 Multiple myeloma not having achieved remission: Secondary | ICD-10-CM

## 2012-09-30 DIAGNOSIS — E559 Vitamin D deficiency, unspecified: Secondary | ICD-10-CM

## 2012-09-30 DIAGNOSIS — E291 Testicular hypofunction: Secondary | ICD-10-CM

## 2012-09-30 LAB — TESTOSTERONE, FREE, TOTAL, SHBG
Testosterone, Free: 31 pg/mL — ABNORMAL LOW (ref 47.0–244.0)
Testosterone-% Free: 2 % (ref 1.6–2.9)

## 2012-09-30 MED ORDER — ERGOCALCIFEROL 1.25 MG (50000 UT) PO CAPS: 50000.0000 [IU] | ORAL_CAPSULE | ORAL | Status: DC

## 2012-09-30 MED ORDER — LEVOTHYROXINE SODIUM 50 MCG PO TABS: 50.0000 ug | ORAL_TABLET | Freq: Every day | ORAL | Status: DC

## 2012-09-30 MED ORDER — VITAMIN D 1000 UNITS PO TABS: 1000.0000 [IU] | ORAL_TABLET | Freq: Every day | ORAL | Status: DC

## 2012-09-30 NOTE — Telephone Encounter (Signed)
Pt informed

## 2012-09-30 NOTE — Telephone Encounter (Signed)
Andre [name], please, inform patient that all labs are normal except for a low vit D - start Vit D Rx and low thyroid production - start thyroid med. He has a low testost as well. Keep ROV with TSH in 4-6 wks to discuss further Rx.  Thx

## 2012-10-01 ENCOUNTER — Ambulatory Visit (HOSPITAL_COMMUNITY)
Admission: RE | Admit: 2012-10-01 | Discharge: 2012-10-01 | Disposition: A | Discharge status: Home / Self Care | Payer: Commercial Managed Care - PPO | Source: Ambulatory Visit | Attending: Oncology | Admitting: Oncology

## 2012-10-01 ENCOUNTER — Other Ambulatory Visit: Payer: Self-pay | Admitting: Oncology

## 2012-10-01 ENCOUNTER — Other Ambulatory Visit (HOSPITAL_BASED_OUTPATIENT_CLINIC_OR_DEPARTMENT_OTHER): Payer: Commercial Managed Care - PPO | Admitting: Lab

## 2012-10-01 DIAGNOSIS — C9000 Multiple myeloma not having achieved remission: Secondary | ICD-10-CM

## 2012-10-01 DIAGNOSIS — M542 Cervicalgia: Secondary | ICD-10-CM | POA: Insufficient documentation

## 2012-10-01 DIAGNOSIS — M25519 Pain in unspecified shoulder: Secondary | ICD-10-CM | POA: Insufficient documentation

## 2012-10-01 DIAGNOSIS — M47812 Spondylosis without myelopathy or radiculopathy, cervical region: Secondary | ICD-10-CM | POA: Insufficient documentation

## 2012-10-01 DIAGNOSIS — M5020 Other cervical disc displacement, unspecified cervical region: Secondary | ICD-10-CM | POA: Insufficient documentation

## 2012-10-01 LAB — COMPREHENSIVE METABOLIC PANEL (CC13)
ALT: 20 U/L (ref 0–55)
Albumin: 3.4 g/dL — ABNORMAL LOW (ref 3.5–5.0)
CO2: 27 mEq/L (ref 22–29)
Calcium: 9.4 mg/dL (ref 8.4–10.4)
Chloride: 103 mEq/L (ref 98–107)
Glucose: 122 mg/dl — ABNORMAL HIGH (ref 70–99)
Sodium: 140 mEq/L (ref 136–145)
Total Bilirubin: 0.42 mg/dL (ref 0.20–1.20)
Total Protein: 6.5 g/dL (ref 6.4–8.3)

## 2012-10-01 LAB — PROTEIN / CREATININE RATIO, URINE
Protein Creatinine Ratio: 0.04 (ref <0.15)
Total Protein, Urine: 3 mg/dL

## 2012-10-01 LAB — CBC WITH DIFFERENTIAL/PLATELET
BASO%: 0.2 % (ref 0.0–2.0)
EOS%: 0.3 % (ref 0.0–7.0)
MCH: 35.4 pg — ABNORMAL HIGH (ref 27.2–33.4)
MCHC: 34.6 g/dL (ref 32.0–36.0)
MCV: 102.3 fL — ABNORMAL HIGH (ref 79.3–98.0)
MONO%: 6.1 % (ref 0.0–14.0)
NEUT%: 86.2 % — ABNORMAL HIGH (ref 39.0–75.0)
RDW: 15.3 % — ABNORMAL HIGH (ref 11.0–14.6)
lymph#: 0.7 10*3/uL — ABNORMAL LOW (ref 0.9–3.3)

## 2012-10-01 MED ORDER — GADOBENATE DIMEGLUMINE 529 MG/ML IV SOLN
17.0000 mL | Freq: Once | INTRAVENOUS | Status: AC | PRN
  Administered 2012-10-01: 17 mL via INTRAVENOUS

## 2012-10-03 LAB — SPEP & IFE WITH QIG
Albumin ELP: 66.1 % (ref 55.8–66.1)
Alpha-1-Globulin: 3.8 % (ref 2.9–4.9)
Alpha-2-Globulin: 10.2 % (ref 7.1–11.8)
Gamma Globulin: 9.3 % — ABNORMAL LOW (ref 11.1–18.8)

## 2012-10-03 LAB — KAPPA/LAMBDA LIGHT CHAINS
Kappa:Lambda Ratio: 0.7 (ref 0.26–1.65)
Lambda Free Lght Chn: 1.45 mg/dL (ref 0.57–2.63)

## 2012-10-08 ENCOUNTER — Telehealth: Payer: Self-pay | Admitting: *Deleted

## 2012-10-08 ENCOUNTER — Other Ambulatory Visit: Payer: Self-pay | Admitting: *Deleted

## 2012-10-08 ENCOUNTER — Encounter: Payer: Self-pay | Admitting: Internal Medicine

## 2012-10-08 DIAGNOSIS — R9089 Other abnormal findings on diagnostic imaging of central nervous system: Secondary | ICD-10-CM | POA: Insufficient documentation

## 2012-10-08 NOTE — Progress Notes (Signed)
Fax received from pt requesting assistance with scan and lab request per visit at [facility]. [name] would like to obtain these test locally.  Faxed request per [facility] ordered and onc tx schedule sent to scheduling.

## 2012-10-08 NOTE — Telephone Encounter (Signed)
Pt informed

## 2012-10-08 NOTE — Telephone Encounter (Signed)
TSH 25.53   0.35 - 5.50 uIU/mL Regarding thyroid test results above, should the 25.53 reading be .2553 or are my results 5 times higher than normal? Thanks. Laruth Bouchard

## 2012-10-08 NOTE — Telephone Encounter (Signed)
The TSH test result is high which means that the thyroid gland function is low. There is a low level of thyroid hormone in your body at present. The value is correct. Pls take the Rx Thx

## 2012-10-08 NOTE — Progress Notes (Signed)
Message left by Andre [name] wanting recommendation per shoulder MRI result.  Per MD review recommended ortho consult- if pt does not have an ortho - request will be sent to Dr Rennis Chris.  This RN returned call to Andre [name], obtianed VM- detailed message left.  Andre [name] left second message stating he does not have an ortho and has heard good things about Dr Rennis Chris and would like to proceed with appointment.  Request placed via Onc Tx Schedule.

## 2012-10-10 ENCOUNTER — Telehealth: Payer: Self-pay | Admitting: Oncology

## 2012-10-10 ENCOUNTER — Other Ambulatory Visit: Payer: Self-pay | Admitting: *Deleted

## 2012-10-10 NOTE — Telephone Encounter (Signed)
S/w the pt and he is aware of the appt with dr supple on 10/14/2012@10 :00am. gve this information to kim in medical records for them to send that office some notes prior to that appt.

## 2012-10-11 ENCOUNTER — Telehealth: Payer: Self-pay | Admitting: Oncology

## 2012-10-11 ENCOUNTER — Other Ambulatory Visit: Payer: Commercial Managed Care - PPO

## 2012-10-11 NOTE — Telephone Encounter (Signed)
lmonvm for pt re lb for 1/28 @ 3:15pm. Also s/w Marchelle Folks @ [facility] re appt w/Dr. Rennis Chris and per Marchelle Folks pt is already on schedule for 1/27 @ 3:30pm. Also lm for pt confirming this appt.

## 2012-10-11 NOTE — Telephone Encounter (Signed)
Faxed pt medical records to Dr. Rennis Chris

## 2012-10-14 ENCOUNTER — Other Ambulatory Visit: Payer: Self-pay | Admitting: *Deleted

## 2012-10-14 DIAGNOSIS — C9000 Multiple myeloma not having achieved remission: Secondary | ICD-10-CM

## 2012-10-14 MED ORDER — OMEPRAZOLE 40 MG PO CPDR: 40.0000 mg | DELAYED_RELEASE_CAPSULE | Freq: Every day | ORAL | Status: DC

## 2012-10-15 ENCOUNTER — Other Ambulatory Visit: Payer: Self-pay | Admitting: Oncology

## 2012-10-15 ENCOUNTER — Ambulatory Visit (HOSPITAL_COMMUNITY)
Admission: RE | Admit: 2012-10-15 | Discharge: 2012-10-15 | Disposition: A | Discharge status: Home / Self Care | Payer: Commercial Managed Care - PPO | Source: Ambulatory Visit | Attending: Oncology | Admitting: Oncology

## 2012-10-15 ENCOUNTER — Other Ambulatory Visit (HOSPITAL_BASED_OUTPATIENT_CLINIC_OR_DEPARTMENT_OTHER): Payer: Commercial Managed Care - PPO | Admitting: Lab

## 2012-10-15 ENCOUNTER — Encounter (HOSPITAL_COMMUNITY): Payer: Self-pay

## 2012-10-15 DIAGNOSIS — R9089 Other abnormal findings on diagnostic imaging of central nervous system: Secondary | ICD-10-CM

## 2012-10-15 DIAGNOSIS — C9000 Multiple myeloma not having achieved remission: Secondary | ICD-10-CM | POA: Insufficient documentation

## 2012-10-15 DIAGNOSIS — R930 Abnormal findings on diagnostic imaging of skull and head, not elsewhere classified: Secondary | ICD-10-CM | POA: Insufficient documentation

## 2012-10-15 DIAGNOSIS — I2510 Atherosclerotic heart disease of native coronary artery without angina pectoris: Secondary | ICD-10-CM | POA: Insufficient documentation

## 2012-10-15 LAB — COMPREHENSIVE METABOLIC PANEL (CC13)
ALT: 25 U/L (ref 0–55)
AST: 25 U/L (ref 5–34)
Alkaline Phosphatase: 41 U/L (ref 40–150)
BUN: 23.7 mg/dL (ref 7.0–26.0)
Creatinine: 1.1 mg/dL (ref 0.7–1.3)

## 2012-10-15 MED ORDER — IOHEXOL 300 MG/ML  SOLN
80.0000 mL | Freq: Once | INTRAMUSCULAR | Status: AC | PRN
  Administered 2012-10-15: 80 mL via INTRAVENOUS

## 2012-10-21 ENCOUNTER — Other Ambulatory Visit: Payer: Self-pay | Admitting: *Deleted

## 2012-10-25 ENCOUNTER — Telehealth: Payer: Self-pay | Admitting: Cardiology

## 2012-10-25 NOTE — Telephone Encounter (Signed)
Pt read in My Chart that he has heart disease and was never told that

## 2012-10-25 NOTE — Telephone Encounter (Signed)
Will have Dr Patty Sermons review CT of chest and call next week

## 2012-11-01 ENCOUNTER — Ambulatory Visit: Payer: Commercial Managed Care - PPO | Admitting: Internal Medicine

## 2012-11-05 ENCOUNTER — Other Ambulatory Visit (HOSPITAL_BASED_OUTPATIENT_CLINIC_OR_DEPARTMENT_OTHER): Payer: Commercial Managed Care - PPO | Admitting: Lab

## 2012-11-05 ENCOUNTER — Ambulatory Visit (HOSPITAL_BASED_OUTPATIENT_CLINIC_OR_DEPARTMENT_OTHER): Payer: Commercial Managed Care - PPO

## 2012-11-05 VITALS — BP 127/74 | HR 73 | Temp 97.0°F | Resp 20

## 2012-11-05 DIAGNOSIS — C9000 Multiple myeloma not having achieved remission: Secondary | ICD-10-CM

## 2012-11-05 LAB — PROTEIN / CREATININE RATIO, URINE
Protein Creatinine Ratio: 0.1 (ref <0.15)
Total Protein, Urine: 10 mg/dL

## 2012-11-05 LAB — COMPREHENSIVE METABOLIC PANEL (CC13)
ALT: 22 U/L (ref 0–55)
CO2: 25 mEq/L (ref 22–29)
Creatinine: 1.2 mg/dL (ref 0.7–1.3)
Total Bilirubin: 0.47 mg/dL (ref 0.20–1.20)

## 2012-11-05 LAB — CBC WITH DIFFERENTIAL/PLATELET
Basophils Absolute: 0 10*3/uL (ref 0.0–0.1)
EOS%: 3.3 % (ref 0.0–7.0)
HCT: 37.3 % — ABNORMAL LOW (ref 38.4–49.9)
HGB: 12.5 g/dL — ABNORMAL LOW (ref 13.0–17.1)
LYMPH%: 16.6 % (ref 14.0–49.0)
MCH: 33.1 pg (ref 27.2–33.4)
MCHC: 33.5 g/dL (ref 32.0–36.0)
MCV: 98.7 fL — ABNORMAL HIGH (ref 79.3–98.0)
MONO%: 12.6 % (ref 0.0–14.0)
NEUT%: 67 % (ref 39.0–75.0)
Platelets: 107 10*3/uL — ABNORMAL LOW (ref 140–400)

## 2012-11-05 MED ORDER — ZOLEDRONIC ACID 4 MG/100ML IV SOLN
4.0000 mg | Freq: Once | INTRAVENOUS | Status: AC
  Administered 2012-11-05: 4 mg via INTRAVENOUS
  Filled 2012-11-05: qty 100

## 2012-11-05 NOTE — Telephone Encounter (Signed)
Left message to call back  

## 2012-11-05 NOTE — Patient Instructions (Signed)
Zoledronic Acid injection (Hypercalcemia, Oncology) What is this medicine? ZOLEDRONIC ACID (ZOE le dron ik AS id) lowers the amount of calcium loss from bone. It is used to treat too much calcium in your blood from cancer. It is also used to prevent complications of cancer that has spread to the bone. This medicine may be used for other purposes; ask your health care provider or pharmacist if you have questions. What should I tell my health care provider before I take this medicine? They need to know if you have any of these conditions: -aspirin-sensitive asthma -dental disease -kidney disease -an unusual or allergic reaction to zoledronic acid, other medicines, foods, dyes, or preservatives -pregnant or trying to get pregnant -breast-feeding How should I use this medicine? This medicine is for infusion into a vein. It is given by a health care professional in a hospital or clinic setting. Talk to your pediatrician regarding the use of this medicine in children. Special care may be needed. Overdosage: If you think you have taken too much of this medicine contact a poison control center or emergency room at once. NOTE: This medicine is only for you. Do not share this medicine with others. What if I miss a dose? It is important not to miss your dose. Call your doctor or health care professional if you are unable to keep an appointment. What may interact with this medicine? -certain antibiotics given by injection -NSAIDs, medicines for pain and inflammation, like ibuprofen or naproxen -some diuretics like bumetanide, furosemide -teriparatide -thalidomide This list may not describe all possible interactions. Give your health care provider a list of all the medicines, herbs, non-prescription drugs, or dietary supplements you use. Also tell them if you smoke, drink alcohol, or use illegal drugs. Some items may interact with your medicine. What should I watch for while using this medicine? Visit  your doctor or health care professional for regular checkups. It may be some time before you see the benefit from this medicine. Do not stop taking your medicine unless your doctor tells you to. Your doctor may order blood tests or other tests to see how you are doing. Women should inform their doctor if they wish to become pregnant or think they might be pregnant. There is a potential for serious side effects to an unborn child. Talk to your health care professional or pharmacist for more information. You should make sure that you get enough calcium and vitamin D while you are taking this medicine. Discuss the foods you eat and the vitamins you take with your health care professional. Some people who take this medicine have severe bone, joint, and/or muscle pain. This medicine may also increase your risk for a broken thigh bone. Tell your doctor right away if you have pain in your upper leg or groin. Tell your doctor if you have any pain that does not go away or that gets worse. What side effects may I notice from receiving this medicine? Side effects that you should report to your doctor or health care professional as soon as possible: -allergic reactions like skin rash, itching or hives, swelling of the face, lips, or tongue -anxiety, confusion, or depression -breathing problems -changes in vision -feeling faint or lightheaded, falls -jaw burning, cramping, pain -muscle cramps, stiffness, or weakness -trouble passing urine or change in the amount of urine Side effects that usually do not require medical attention (report to your doctor or health care professional if they continue or are bothersome): -bone, joint, or muscle pain -  fever -hair loss -irritation at site where injected -loss of appetite -nausea, vomiting -stomach upset -tired This list may not describe all possible side effects. Call your doctor for medical advice about side effects. You may report side effects to FDA at  1-800-FDA-1088. Where should I keep my medicine? This drug is given in a hospital or clinic and will not be stored at home. NOTE: This sheet is a summary. It may not cover all possible information. If you have questions about this medicine, talk to your doctor, pharmacist, or health care provider.  2013, [employer]vier/Gold Standard. (03/03/2011 9:06:58 AM)  

## 2012-11-05 NOTE — Telephone Encounter (Signed)
Pt calling to follow up on this because he has never heard back from Dr. Patty Sermons regarding the situation

## 2012-11-06 ENCOUNTER — Telehealth: Payer: Self-pay | Admitting: Cardiology

## 2012-11-06 DIAGNOSIS — E785 Hyperlipidemia, unspecified: Secondary | ICD-10-CM

## 2012-11-06 NOTE — Telephone Encounter (Signed)
Left message to call back  

## 2012-11-06 NOTE — Telephone Encounter (Signed)
New Problem:    Patient called in returning your call. Please call back. 

## 2012-11-07 LAB — SPEP & IFE WITH QIG
Alpha-1-Globulin: 3.6 % (ref 2.9–4.9)
Beta 2: 3.7 % (ref 3.2–6.5)
Gamma Globulin: 8.1 % — ABNORMAL LOW (ref 11.1–18.8)
IgA: 65 mg/dL — ABNORMAL LOW (ref 68–379)
IgG (Immunoglobin G), Serum: 629 mg/dL — ABNORMAL LOW (ref 650–1600)
IgM, Serum: 46 mg/dL (ref 41–251)

## 2012-11-07 LAB — KAPPA/LAMBDA LIGHT CHAINS: Lambda Free Lght Chn: 1.48 mg/dL (ref 0.57–2.63)

## 2012-11-07 NOTE — Telephone Encounter (Signed)
New problem    Returning call back to nurse.   

## 2012-11-07 NOTE — Telephone Encounter (Signed)
Patient not having any cardiac issues but has been a long time since he has seen  Dr. Patty Sermons, scheduled ov next week.

## 2012-11-12 ENCOUNTER — Telehealth: Payer: Self-pay | Admitting: *Deleted

## 2012-11-12 ENCOUNTER — Telehealth: Payer: Self-pay | Admitting: Oncology

## 2012-11-12 ENCOUNTER — Ambulatory Visit (HOSPITAL_BASED_OUTPATIENT_CLINIC_OR_DEPARTMENT_OTHER): Payer: Commercial Managed Care - PPO | Admitting: Oncology

## 2012-11-12 VITALS — BP 145/85 | HR 60 | Temp 97.5°F | Resp 20 | Ht 69.0 in | Wt 187.1 lb

## 2012-11-12 DIAGNOSIS — C9000 Multiple myeloma not having achieved remission: Secondary | ICD-10-CM

## 2012-11-12 NOTE — Telephone Encounter (Signed)
, °

## 2012-11-12 NOTE — Telephone Encounter (Signed)
Per staff phone call and POF I have schedueld appts.  JMW  

## 2012-11-12 NOTE — Progress Notes (Signed)
ID: Andre [name]   DOB: 1951-05-12  MR#: [id number]  WUJ#:[id number]   PCP: Andre [name] SU: OTHER MD: Andre [name], Andre [name], Andre [name]  HISTORY OF PRESENT ILLNESS: The patient was worked up for peptic ulcer disease in August of 2011, with significant bleeding and anemia.  The patient was Helicobacter pylori negative.  He received some epinephrine when he had his EGD and then started on Protonix.  [name] anemia slowly resolved so that by September, his hemoglobin was up to 10.9 and by earlier this month, his hemoglobin was up to 12.5.    As part of his general workup, he was found to have a slightly elevated globulin fraction.  In September, his total protein was 8.3 with an albumin of 3.8.  In January, the total protein was 8.4 and albumin 3.6.  With persistence of this slight abnormality, Dr. Juanda [name] obtained serum immunofixation and SPEP.  The SPTP showed an M-spike of 2.67 grams.  A total IgG was 4,190.  Total IgA low at 28.  Total IgM low at 28 also.  The immunofixation showed a monoclonal IgG lambda paraprotein.  There were also monoclonal free lambda light chains present. With this information, the patient was referred for further evaluation. A diagnosis of myeloma was confirmed by bone marrow biopsy and subsequebnt treatment is as detailed below  INTERVAL HISTORY: Andre [name] returns today for followup of his multiple myeloma. Since his last visit here his diagnosis of sarcoidosis has been reviewed by neurology at [facility], and a CT of the chest here showed no evidence of adenopathy. He is now off prednisone (as of 2 weeks ago). Other workup is pending.   REVIEW OF SYSTEMS: He is mildly fatigued, and is walking rather than running. He has not gone back to swimming. He continues to have pain in his right shoulder which is intermittent. He feels somewhat depressed. He denies headache, visual changes, nausea, or vomiting. He tells me his balance is good although he has fallen  perhaps 3 times over the last several months, 2 of those occasions being while exercising outside and tripping over something. There has been no fever, rash, bleeding, or unexplained weight loss. A detailed review of systems today was otherwise noncontributory  PAST MEDICAL HISTORY: Past Medical History  Diagnosis Date  . Hypertension   . Hyperlipidemia   . Duodenal ulcer   . Multiple myeloma(203.0) 07/04/2011  . Allergy   Significant for peptic ulcer disease as noted above.  History of hyperlipidemia.  History of anxiety and depression.  History of GERD which is significantly improved with weight loss and history of reactive airway disease, possibly secondary to the GERD which also improved with weight loss.  There was a history of obesity, now much improved secondary to exercise and diet.   PAST SURGICAL HISTORY: Past Surgical History  Procedure Laterality Date  . Cardiolite study  11/25/2003    NORMAL  . Bone marrow transplant  2011    for MM    FAMILY HISTORY Family History  Problem Relation Age of Onset  . Cancer Mother   . Hypertension Father   . Stroke Father   . Asthma Father   . Diabetes Father   [name] father died at the age of 57 following a stroke.  [name] mother died with cancer of the throat at the age of 44.   She had a history of depression and was a smoker.  Not clear how much alcohol she drank, according to the patient.  The patient had two brothers.  One died from the age of 6 from a "kidney problem."  The second one died at the age of 55 from pancreatic cancer. (This may have been duodenal cancer. 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  His wife of 37 years is Andre [name].  Unfortunately, they are in the process of divorce. The patient has two daughters:  Andre [name] who is 60, and  Andre [name] who is 57.  They both live here in [place]. 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  The patient is very close to his daughters.  There are no grandchildren.  The patient is not a church attender.  He does derive a great deal of support from friends at the "[employer]" which he attends very regularly, and also participates in Al-Anon even though actually there is no alcohol or drug problem in the family.  He participates in this group and he gets quite a bit of support from it.      ADVANCED DIRECTIVES: In place  HEALTH MAINTENANCE: History  Substance Use Topics  . Smoking status: Former Smoker -- 3 years    Quit date: 03/27/1969  . Smokeless tobacco: Never Used  . Alcohol Use: No     Colonoscopy:  PSA:  Lipid panel:  Allergies  Allergen Reactions  . Crestor (Rosuvastatin Calcium)   . Lipitor (Atorvastatin Calcium)     Current Outpatient Prescriptions  Medication Sig Dispense Refill  . aspirin 81 MG tablet Take 1 tablet (81 mg total) by mouth daily.  30 tablet    . cholecalciferol (VITAMIN D) 1000 UNITS tablet Take 1 tablet (1,000 Units total) by mouth daily.  100 tablet  3  . Coenzyme Q10 (CO Q 10 PO) Take by mouth daily.      . ergocalciferol (VITAMIN D2) 50000 UNITS capsule Take 1 capsule (50,000 Units total) by mouth once a week.  6 capsule  0  . escitalopram (LEXAPRO) 10 MG tablet Take 1 tablet (10 mg total) by mouth daily.  30 tablet  5  . ezetimibe (ZETIA) 10 MG tablet Take 1 tablet (10 mg total) by mouth daily.  30 tablet  0  . famciclovir (FAMVIR) 250 MG tablet Take 250 mg by mouth daily.       . fish oil-omega-3 fatty acids 1000 MG capsule Take 2 g by mouth daily.       . fluticasone (FLONASE) 50 MCG/ACT nasal spray Place 1 spray into the nose daily.  16 g  0  . guaiFENesin (MUCINEX) 600 MG 12 hr tablet Take 1,200 mg by mouth 2 (two) times daily.      [name] levothyroxine (SYNTHROID) 50 MCG tablet Take 1 tablet (50 mcg total) by mouth daily.  30 tablet  11  . loratadine  (CLARITIN) 10 MG tablet Take 1 tablet (10 mg total) by mouth daily.      [name] LORazepam (ATIVAN) 0.5 MG tablet TAKE 1/2 TO ONE TAB BY MOUTH TWICE DAILY  60 tablet  3  . omeprazole (PRILOSEC) 40 MG capsule Take 1 capsule (40 mg total) by mouth daily.  30 capsule  5  . predniSONE (DELTASONE) 20 MG tablet Take 20 mg by mouth daily.      . traZODone (DESYREL) 50 MG tablet Take 1 tablet (50 mg total) by mouth at bedtime.  30 tablet  3  . zolendronic acid (ZOMETA) 4 MG/5ML injection Inject 4 mg into the vein once.      [name] zolpidem (AMBIEN) 5 MG tablet Take 1 tablet (5 mg total) by mouth at bedtime as needed for sleep.  60 tablet  4  . [DISCONTINUED] fexofenadine (ALLEGRA) 60 MG tablet Take 180 mg by mouth daily.         No current facility-administered medications for this visit.    OBJECTIVE: Middle-aged white male who appears tired Filed Vitals:   11/12/12 0928  BP: 145/85  Pulse: 60  Temp: 97.5 F (36.4 C)  Resp: 20     Body mass index is 27.62 kg/(m^2).    ECOG FS: 1  Sclerae unicteric Oropharynx clear No peripheral adenopathy Lungs no rales or rhonchi Heart regular rate and rhythm Abd soft, nontender, positive bowel sounds MSK no focal spinal tenderness, no peripheral edema Neuro: nonfocal; well oriented; appropriate affect  LAB RESULTS:    Results for ROMELO, SCIANDRA (MRN [id number]) as of 11/12/2012 13:42  Ref. Range 11/05/2012 08:06  Albumin ELP Latest Range: 55.8-66.1 % 67.6 (H)  COMMENT (PROTEIN ELECTROPHOR) No range found *  Alpha-1-Globulin Latest Range: 2.9-4.9 % 3.6  Alpha-2-Globulin Latest Range: 7.1-11.8 % 10.1  Beta Globulin Latest Range: 4.7-7.2 % 6.9  Beta 2 Latest Range: 3.2-6.5 % 3.7  Gamma Globulin Latest Range: 11.1-18.8 % 8.1 (L)  M-SPIKE, % No range found NOT DET  SPE Interp. No range found *  IgG (Immunoglobin G), Serum Latest Range: [phone] mg/dL 119 (L)  IgA Latest Range: 68-379 mg/dL 65 (L)  IgM, Serum Latest Range: 41-251 mg/dL 46  Total Protein,  serum electrophor Latest Range: 6.0-8.3 g/dL 6.2  Kappa free light chain Latest Range: 0.33-1.94 mg/dL 1.47  Lambda Free Lght Chn Latest Range: 0.57-2.63 mg/dL 8.29  Kappa:Lambda Ratio Latest Range: 0.26-1.65  0.93     Lab Results  Component Value Date   WBC 4.3 11/05/2012   NEUTROABS 2.9 11/05/2012   HGB 12.5* 11/05/2012   HCT 37.3* 11/05/2012   MCV 98.7* 11/05/2012   PLT 107* 11/05/2012      Chemistry      Component Value Date/Time   NA 139 11/05/2012 0806   NA 140 04/10/2012 1123   K 4.0 11/05/2012 0806   K 4.2 04/10/2012 1123   CL 105 11/05/2012 0806   CL 103 04/10/2012 1123   CO2 25 11/05/2012 0806   CO2 26 04/10/2012 1123   BUN 20.4 11/05/2012 0806   BUN 16 04/10/2012 1123   CREATININE 1.2 11/05/2012 0806   CREATININE 1.49* 04/10/2012 1123      Component Value Date/Time   CALCIUM 9.7 11/05/2012 0806   CALCIUM 9.9 04/10/2012 1123   ALKPHOS 39* 11/05/2012 0806   ALKPHOS 38* 09/27/2012 1132   AST 25 11/05/2012 0806   AST 25 09/27/2012 1132   ALT 22 11/05/2012 0806   ALT 26 09/27/2012 1132   BILITOT 0.47 11/05/2012 0806   BILITOT 0.6 09/27/2012 1132       No results found for this basename: LABCA2    No components found with this basename: LABCA125    No results found for this basename: INR,  in the last 168 hours  Urinalysis    Component Value Date/Time   COLORURINE YELLOW 09/27/2012 1132   APPEARANCEUR CLEAR 09/27/2012 1132   LABSPEC 1.015 09/27/2012 1132   PHURINE 6.0 09/27/2012 1132   GLUCOSEU NEGATIVE 05/02/2010 0957   HGBUR SMALL 09/27/2012 1132  BILIRUBINUR NEGATIVE 09/27/2012 1132   KETONESUR NEGATIVE 09/27/2012 1132   PROTEINUR NEGATIVE 05/02/2010 0957   UROBILINOGEN 0.2 09/27/2012 1132   NITRITE NEGATIVE 09/27/2012 1132   LEUKOCYTESUR NEGATIVE 09/27/2012 1132    STUDIES: 10/15/2012  *RADIOLOGY REPORT*  Clinical Data: Multiple myeloma.  Possible sarcoidosis.  CT CHEST WITH CONTRAST  Technique:  Multidetector CT imaging of the chest was performed following the standard  protocol during bolus administration of intravenous contrast.  Contrast: 80mL OMNIPAQUE IOHEXOL 300 MG/ML  SOLN  Comparison: 05/02/2010  Findings: Coronary artery atherosclerosis noted particularly involving the left anterior descending coronary artery.  There is prominence of stool in the upper colon.  No mediastinal or hilar adenopathy.  No nodular airway thickening or nodularity along the septa and pleural surfaces.  No architectural distortion of the lung.  No upper abdominal findings characteristic of sarcoidosis.  No specific bony lesions of myeloma observed.  IMPRESSION:  1.  No thoracic findings of sarcoidosis. 2.  Coronary artery disease. 3.  No specific osseous abnormalities characteristic of myeloma are observed in the thorax. 4.  Prominence of stool in the transverse colon may reflect constipation.   Original Report Authenticated By: Andre Rong, M.D.     ASSESSMENT: 62 y.o.   man with a history of multiple myeloma diagnosed February of 2012, with an initial M-spike of 2.6 g/dL, IFE showing an IgG lambda paraprotein and free lambda light chains in the urine. Cytogenetics showed trisomy 32. Bone marrow biopsy showed a 22% plasmacytosis. Treated with   (1) bortezomib (subcutaneously), lenalidomide, and dexamethasone, with repeat bone marrow biopsy May of 2012 showing 10% plasmacytosis   (2) high-dose chemotherapy with BCNU and melphalan at [facility], followed by stem cell rescue July of 2012   (3) on zoledronic acid started December of 2012, initially monthly, currently every 3 months, is recent dose 11/05/12  (4) low-dose lenalidomide resumed April 2013, interrupted several times since, currently off  (5) CNS symptoms and abnormal brain MRI being evaluated by neurology    PLAN: Andre [name] is doing great from a myeloma point of view, with no evidence of disease activity. He will be seen at [facility] again in April. He will see [place] again in May with his next dose of zoledronic acid. I have  encouraged him to continue to increase his exercise regimen. He is still recovering from coming off the prednisone. I do not think this is a good time to resume the Revlimid, but certainly if he is feeling better and the neurologic issues have largely been clarified by the time he sees Dr. Barbaraann Boys in April we can consider starting Revlimid then at her discretion. MAGRINAT,GUSTAV C    11/12/2012

## 2012-11-13 ENCOUNTER — Ambulatory Visit (INDEPENDENT_AMBULATORY_CARE_PROVIDER_SITE_OTHER): Payer: Commercial Managed Care - PPO | Admitting: Internal Medicine

## 2012-11-13 ENCOUNTER — Ambulatory Visit (INDEPENDENT_AMBULATORY_CARE_PROVIDER_SITE_OTHER): Payer: Commercial Managed Care - PPO

## 2012-11-13 ENCOUNTER — Telehealth: Payer: Self-pay | Admitting: Oncology

## 2012-11-13 ENCOUNTER — Encounter: Payer: Self-pay | Admitting: Internal Medicine

## 2012-11-13 ENCOUNTER — Other Ambulatory Visit (INDEPENDENT_AMBULATORY_CARE_PROVIDER_SITE_OTHER): Payer: Commercial Managed Care - PPO

## 2012-11-13 VITALS — BP 144/92 | HR 80 | Temp 98.0°F | Resp 16 | Wt 186.0 lb

## 2012-11-13 DIAGNOSIS — E291 Testicular hypofunction: Secondary | ICD-10-CM

## 2012-11-13 DIAGNOSIS — F329 Major depressive disorder, single episode, unspecified: Secondary | ICD-10-CM

## 2012-11-13 DIAGNOSIS — E785 Hyperlipidemia, unspecified: Secondary | ICD-10-CM

## 2012-11-13 DIAGNOSIS — E559 Vitamin D deficiency, unspecified: Secondary | ICD-10-CM

## 2012-11-13 DIAGNOSIS — R5383 Other fatigue: Secondary | ICD-10-CM

## 2012-11-13 DIAGNOSIS — E039 Hypothyroidism, unspecified: Secondary | ICD-10-CM

## 2012-11-13 DIAGNOSIS — C9000 Multiple myeloma not having achieved remission: Secondary | ICD-10-CM

## 2012-11-13 DIAGNOSIS — R5381 Other malaise: Secondary | ICD-10-CM

## 2012-11-13 DIAGNOSIS — F341 Dysthymic disorder: Secondary | ICD-10-CM

## 2012-11-13 LAB — TSH: TSH: 8.57 u[IU]/mL — ABNORMAL HIGH (ref 0.35–5.50)

## 2012-11-13 LAB — LIPID PANEL
Cholesterol: 211 mg/dL — ABNORMAL HIGH (ref 0–200)
Total CHOL/HDL Ratio: 5
Triglycerides: 73 mg/dL (ref 0.0–149.0)
VLDL: 14.6 mg/dL (ref 0.0–40.0)

## 2012-11-13 MED ORDER — VITAMIN D 1000 UNITS PO TABS: 1000.0000 [IU] | ORAL_TABLET | Freq: Every day | ORAL | Status: DC

## 2012-11-13 MED ORDER — TESTOSTERONE 10 MG/ACT (2%) TD GEL: 2.0000 | TRANSDERMAL | Status: DC

## 2012-11-13 NOTE — Assessment & Plan Note (Signed)
Considering the Testost replacement

## 2012-11-13 NOTE — Assessment & Plan Note (Signed)
Continue with current prescription therapy as reflected on the Med list.  

## 2012-11-13 NOTE — Assessment & Plan Note (Signed)
Better on Lexapro 

## 2012-11-13 NOTE — Assessment & Plan Note (Signed)
Discussed options 

## 2012-11-13 NOTE — Telephone Encounter (Signed)
S/W THE PT AND HE IS AWARE OF HIS APPT FOR LABS ONLY ON 01/27/2013

## 2012-11-13 NOTE — Progress Notes (Signed)
Quick Note:  Please make copy of labs for patient visit. ______ 

## 2012-11-13 NOTE — Assessment & Plan Note (Signed)
Continue with current prescription therapy as reflected on the Med list. TSH 

## 2012-11-13 NOTE — Progress Notes (Signed)
   Subjective:     HPI   New patient presents for a f/u  chronic hypertension that has been well controlled with medicines; to address chronic fatigue, insomnia,  hyperlipidemia controlled with medicines as well; and to address MM, anxiety.  F/u depression - better. F/u hypogonadism and low testosterone  BP Readings from Last 3 Encounters:  11/13/12 144/92  11/12/12 145/85  11/05/12 127/74   Wt Readings from Last 3 Encounters:  11/13/12 186 lb (84.369 kg)  11/12/12 187 lb 1.6 oz (84.868 kg)  09/27/12 185 lb (83.915 kg)       Review of Systems  Constitutional: Positive for fatigue. Negative for diaphoresis, appetite change and unexpected weight change.  HENT: Negative for nosebleeds, congestion, sore throat, facial swelling, sneezing, trouble swallowing and neck pain.   Eyes: Negative for itching and visual disturbance.  Respiratory: Negative for cough, chest tightness and wheezing.   Cardiovascular: Negative for chest pain, palpitations and leg swelling.  Gastrointestinal: Negative for nausea, vomiting, diarrhea, blood in stool and abdominal distention.  Genitourinary: Negative for dysuria, urgency, frequency and hematuria.  Musculoskeletal: Negative for back pain, joint swelling and gait problem.  Skin: Negative for rash.  Neurological: Positive for weakness. Negative for dizziness, tremors and speech difficulty.  Psychiatric/Behavioral: Positive for dysphoric mood. Negative for suicidal ideas, sleep disturbance and agitation. The patient is nervous/anxious.        Objective:   Physical Exam  Constitutional: He is oriented to person, place, and time. He appears well-developed. No distress.  Appears tired  HENT:  Mouth/Throat: Oropharynx is clear and moist.  Eyes: Conjunctivae are normal. Pupils are equal, round, and reactive to light.  Neck: Normal range of motion. No JVD present. No thyromegaly present.  Cardiovascular: Normal rate, regular rhythm, normal heart  sounds and intact distal pulses.  Exam reveals no gallop and no friction rub.   No murmur heard. Pulmonary/Chest: Effort normal and breath sounds normal. No respiratory distress. He has no wheezes. He has no rales. He exhibits no tenderness.  Abdominal: Soft. Bowel sounds are normal. He exhibits no distension and no mass. There is no tenderness. There is no rebound and no guarding.  Musculoskeletal: Normal range of motion. He exhibits no edema and no tenderness.  Lymphadenopathy:    He has no cervical adenopathy.  Neurological: He is alert and oriented to person, place, and time. He has normal reflexes. No cranial nerve deficit. He exhibits normal muscle tone. Coordination normal.  Skin: Skin is warm and dry. No rash noted.  Psychiatric: He has a normal mood and affect. His behavior is normal. Judgment and thought content normal.    Lab Results  Component Value Date   WBC 4.3 11/05/2012   HGB 12.5* 11/05/2012   HCT 37.3* 11/05/2012   PLT 107* 11/05/2012   GLUCOSE 97 11/05/2012   CHOL 211* 11/13/2012   TRIG 73.0 11/13/2012   HDL 44.70 11/13/2012   LDLDIRECT 155.8 11/13/2012   LDLCALC 84 03/28/2012   ALT 22 11/05/2012   AST 25 11/05/2012   NA 139 11/05/2012   K 4.0 11/05/2012   CL 105 11/05/2012   CREATININE 1.2 11/05/2012   BUN 20.4 11/05/2012   CO2 25 11/05/2012   TSH 25.53* 09/27/2012   PSA 0.46 09/27/2012   INR 0.90 02/08/2011   HGBA1C 5.4 03/27/2012    Chart reviewed      Assessment & Plan:

## 2012-11-14 ENCOUNTER — Telehealth: Payer: Self-pay | Admitting: Internal Medicine

## 2012-11-14 MED ORDER — LEVOTHYROXINE SODIUM 50 MCG PO TABS: 75.0000 ug | ORAL_TABLET | Freq: Every day | ORAL | Status: DC

## 2012-11-14 MED ORDER — LEVOTHYROXINE SODIUM 75 MCG PO TABS: 75.0000 ug | ORAL_TABLET | Freq: Every day | ORAL | Status: DC

## 2012-11-14 NOTE — Telephone Encounter (Signed)
Pt informed

## 2012-11-14 NOTE — Telephone Encounter (Signed)
Andre [name], please, inform patient that he needs to take Levothyroxine 75 mcg/d TSH in 2 mo Thx

## 2012-11-15 ENCOUNTER — Encounter: Payer: Self-pay | Admitting: Cardiology

## 2012-11-15 ENCOUNTER — Ambulatory Visit (INDEPENDENT_AMBULATORY_CARE_PROVIDER_SITE_OTHER): Payer: Commercial Managed Care - PPO | Admitting: Cardiology

## 2012-11-15 VITALS — BP 128/90 | HR 62 | Ht 69.0 in | Wt 186.0 lb

## 2012-11-15 DIAGNOSIS — E785 Hyperlipidemia, unspecified: Secondary | ICD-10-CM

## 2012-11-15 MED ORDER — EZETIMIBE 10 MG PO TABS: 10.0000 mg | ORAL_TABLET | Freq: Every day | ORAL | Status: DC

## 2012-11-15 NOTE — Assessment & Plan Note (Addendum)
His LDL cholesterol is 155.  Previously he had been on Zetia which he tolerated well but has run out.  We will restart Zetia 10 mg daily.  The patient will also work hard on careful diet and weight loss.  He has gained a lot of weight as a result of his previous chemotherapy which he has now completed.  In the past he had difficulty with statins because of developing abnormal liver function studies.  His recent liver function tests have been normal

## 2012-11-15 NOTE — Progress Notes (Signed)
Drue Second Date of Birth:  April 26, 1951 [facility] [address] [place], [place]  [address] [phone]  Fax   [phone]  HPI: This 62 year old gentleman is seen after a long absence.  We have not seen him in several years.  In the and he was diagnosed with myeloma and was treated by Dr. Darnelle Catalan and also by a myeloma specialist at [facility].  He is presently in remission and that he is not on any recent chemotherapy From a cardiac standpoint the patient has a past history of labile hypertension and a history of hypercholesterolemia.  Current Outpatient Prescriptions  Medication Sig Dispense Refill  . aspirin 81 MG tablet Take 1 tablet (81 mg total) by mouth daily.  30 tablet    . cholecalciferol (VITAMIN D) 1000 UNITS tablet Take 1 tablet (1,000 Units total) by mouth daily.  100 tablet  3  . escitalopram (LEXAPRO) 10 MG tablet Take 1 tablet (10 mg total) by mouth daily.  30 tablet  5  . fish oil-omega-3 fatty acids 1000 MG capsule Take 2 g by mouth daily.       [name] levothyroxine (SYNTHROID) 75 MCG tablet Take 1 tablet (75 mcg total) by mouth daily.  30 tablet  2  . loratadine (CLARITIN) 10 MG tablet Take 1 tablet (10 mg total) by mouth daily.      [name] omeprazole (PRILOSEC) 40 MG capsule Take 1 capsule (40 mg total) by mouth daily.  30 capsule  5  . ezetimibe (ZETIA) 10 MG tablet Take 1 tablet (10 mg total) by mouth daily.  90 tablet  3  . [DISCONTINUED] fexofenadine (ALLEGRA) 60 MG tablet Take 180 mg by mouth daily.         No current facility-administered medications for this visit.    Allergies  Allergen Reactions  . Crestor (Rosuvastatin Calcium)   . Lipitor (Atorvastatin Calcium)     Patient Active Problem List  Diagnosis  . HYPERLIPIDEMIA  . ANEMIA  . ASTHMA, UNSPECIFIED  . SCHATZKI'S RING  . PEPTIC ULCER DISEASE  . HIATAL HERNIA  . SLEEP APNEA, MILD  . TRANSAMINASES, SERUM, ELEVATED  . Multiple myeloma  . Myeloma  . TIA (transient ischemic  attack)  . Transient diplopia  . Rash  . Pancytopenia  . Fatigue  . Insomnia  . Reactive depression (situational)  . Well adult exam  . Vitamin D deficiency  . Hypothyroidism  . Hypogonadism male  . Abnormal brain MRI    History  Smoking status  . Former Smoker -- 3 years  . Quit date: 03/27/1969  Smokeless tobacco  . Never Used    History  Alcohol Use No    Family History  Problem Relation Age of Onset  . Cancer Mother   . Hypertension Father   . Stroke Father   . Asthma Father   . Diabetes Father     Review of Systems: The patient denies any heat or cold intolerance.  No weight gain or weight loss.  The patient denies headaches or blurry vision.  There is no cough or sputum production.  The patient denies dizziness.  There is no hematuria or hematochezia.  The patient denies any muscle aches or arthritis.  The patient denies any rash.  The patient denies frequent falling or instability.  There is no history of depression or anxiety.  All other systems were reviewed and are negative.   Physical Exam: Filed Vitals:   11/15/12 1504  BP: 128/90  Pulse: 62  the general appearance reveals a well-developed well-nourished pleasant gentleman in no distress.The head and neck exam reveals pupils equal and reactive.  Extraocular movements are full.  There is no scleral icterus.  The mouth and pharynx are normal.  The neck is supple.  The carotids reveal no bruits.  The jugular venous pressure is normal.  The  thyroid is not enlarged.  There is no lymphadenopathy.  The chest is clear to percussion and auscultation.  There are no rales or rhonchi.  Expansion of the chest is symmetrical.  The precordium is quiet.  The first heart sound is normal.  The second heart sound is physiologically split.  There is no murmur gallop rub or click.  There is no abnormal lift or heave.  The abdomen is soft and nontender.  The bowel sounds are normal.  The liver and spleen are not enlarged.  There  are no abdominal masses.  There are no abdominal bruits.  Extremities reveal good pedal pulses.  There is mild ankle edema worse on the right..  There is no cyanosis or clubbing.  Strength is normal and symmetrical in all extremities.  There is no lateralizing weakness.  There are no sensory deficits.  The skin is warm and dry.  There is no rash.      Assessment / Plan: Continue same medication and restart Zetia 10 mg daily.  For his blood pressure he will continue to exercise and be more careful with diet and lose weight and in particular watch dietary salt. Recheck in 6 months for followup office visit lipid panel hepatic function panel and basal metabolic panel.

## 2012-11-15 NOTE — Patient Instructions (Addendum)
Start Zetia 10 mg daily   Your physician wants you to follow-up in: 6 months with fasting lab. You will receive a reminder letter in the mail two months in advance. If you don't receive a letter, please call our office to schedule the follow-up appointment.

## 2012-11-21 ENCOUNTER — Ambulatory Visit: Payer: Commercial Managed Care - PPO | Admitting: Internal Medicine

## 2013-01-02 ENCOUNTER — Other Ambulatory Visit: Payer: Self-pay | Admitting: Oncology

## 2013-01-02 ENCOUNTER — Encounter: Payer: Self-pay | Admitting: Oncology

## 2013-01-10 ENCOUNTER — Other Ambulatory Visit (INDEPENDENT_AMBULATORY_CARE_PROVIDER_SITE_OTHER): Payer: Commercial Managed Care - PPO

## 2013-01-10 ENCOUNTER — Ambulatory Visit (INDEPENDENT_AMBULATORY_CARE_PROVIDER_SITE_OTHER): Payer: Commercial Managed Care - PPO | Admitting: Internal Medicine

## 2013-01-10 ENCOUNTER — Ambulatory Visit (INDEPENDENT_AMBULATORY_CARE_PROVIDER_SITE_OTHER)
Admission: RE | Admit: 2013-01-10 | Discharge: 2013-01-10 | Disposition: A | Discharge status: Home / Self Care | Payer: Commercial Managed Care - PPO | Source: Ambulatory Visit | Attending: Internal Medicine | Admitting: Internal Medicine

## 2013-01-10 ENCOUNTER — Telehealth: Payer: Self-pay | Admitting: Internal Medicine

## 2013-01-10 ENCOUNTER — Encounter: Payer: Self-pay | Admitting: Internal Medicine

## 2013-01-10 VITALS — BP 118/74 | HR 74 | Temp 97.6°F | Resp 16 | Wt 182.0 lb

## 2013-01-10 DIAGNOSIS — E039 Hypothyroidism, unspecified: Secondary | ICD-10-CM

## 2013-01-10 DIAGNOSIS — R059 Cough, unspecified: Secondary | ICD-10-CM | POA: Insufficient documentation

## 2013-01-10 DIAGNOSIS — A499 Bacterial infection, unspecified: Secondary | ICD-10-CM

## 2013-01-10 DIAGNOSIS — R05 Cough: Secondary | ICD-10-CM

## 2013-01-10 DIAGNOSIS — J329 Chronic sinusitis, unspecified: Secondary | ICD-10-CM | POA: Insufficient documentation

## 2013-01-10 LAB — TSH: TSH: 5.79 u[IU]/mL — ABNORMAL HIGH (ref 0.35–5.50)

## 2013-01-10 MED ORDER — MOXIFLOXACIN HCL 400 MG PO TABS: 400.0000 mg | ORAL_TABLET | Freq: Every day | ORAL | Status: DC

## 2013-01-10 MED ORDER — LEVOTHYROXINE SODIUM 88 MCG PO TABS: 88.0000 ug | ORAL_TABLET | Freq: Every day | ORAL | Status: DC

## 2013-01-10 MED ORDER — HYDROCOD POLST-CPM POLST ER 10-8 MG PO CP12: 1.0000 | ORAL_CAPSULE | Freq: Two times a day (BID) | ORAL | Status: DC | PRN

## 2013-01-10 NOTE — Assessment & Plan Note (Signed)
I will treat this with avelox

## 2013-01-10 NOTE — Addendum Note (Signed)
Addended by: Etta Grandchild on: 01/10/2013 01:28 PM   Modules accepted: Orders, Medications

## 2013-01-10 NOTE — Assessment & Plan Note (Signed)
CXR is Neg for PNA He will try tussicaps for the cough

## 2013-01-10 NOTE — Progress Notes (Signed)
Subjective:    Patient ID: Andre [name], male    DOB: 09/18/1951, 62 y.o.   MRN: [id number]  Sinusitis This is a new problem. Episode onset: for 2 weeks. The problem has been gradually worsening since onset. There has been no fever. The fever has been present for less than 1 day. His pain is at a severity of 0/10. He is experiencing no pain. Associated symptoms include chills, congestion, coughing (NP), sinus pressure and a sore throat. Pertinent negatives include no diaphoresis, ear pain, headaches, hoarse voice, neck pain, shortness of breath, sneezing or swollen glands. Past treatments include spray decongestants. The treatment provided mild relief.      Review of Systems  Constitutional: Positive for chills. Negative for fever, diaphoresis, activity change, appetite change, fatigue and unexpected weight change.  HENT: Positive for congestion, sore throat and sinus pressure. Negative for ear pain, hoarse voice, rhinorrhea, sneezing, trouble swallowing, neck pain, dental problem, voice change and postnasal drip.   Eyes: Negative.   Respiratory: Positive for cough (NP). Negative for choking, shortness of breath and stridor.   Cardiovascular: Negative.  Negative for chest pain, palpitations and leg swelling.  Gastrointestinal: Negative.  Negative for nausea, vomiting, abdominal pain, diarrhea and constipation.  Endocrine: Negative.   Genitourinary: Negative.   Musculoskeletal: Negative.   Skin: Negative.  Negative for rash.  Allergic/Immunologic: Positive for immunocompromised state. Negative for environmental allergies and food allergies.  Neurological: Negative.  Negative for dizziness and headaches.  Hematological: Negative.  Negative for adenopathy. Does not bruise/bleed easily.  Psychiatric/Behavioral: Negative.        Objective:   Physical Exam  Vitals reviewed. Constitutional: He is oriented to person, place, and time. He appears well-developed and well-nourished.  Non-toxic  appearance. He does not have a sickly appearance. He does not appear ill. No distress.  HENT:  Head: No trismus in the jaw.  Right Ear: Hearing, tympanic membrane, external ear and ear canal normal.  Left Ear: Hearing, tympanic membrane, external ear and ear canal normal.  Nose: Mucosal edema and rhinorrhea present. No nose lacerations, sinus tenderness, nasal deformity, septal deviation or nasal septal hematoma. No epistaxis.  No foreign bodies. Right sinus exhibits maxillary sinus tenderness. Right sinus exhibits no frontal sinus tenderness. Left sinus exhibits maxillary sinus tenderness. Left sinus exhibits no frontal sinus tenderness.  Mouth/Throat: Oropharynx is clear and moist and mucous membranes are normal. Mucous membranes are not pale, not dry and not cyanotic. No oral lesions. No edematous. No oropharyngeal exudate, posterior oropharyngeal edema, posterior oropharyngeal erythema or tonsillar abscesses.  Eyes: Conjunctivae are normal. Right eye exhibits no discharge. Left eye exhibits no discharge. No scleral icterus.  Neck: Normal range of motion. Neck supple. No JVD present. No tracheal deviation present. No thyromegaly present.  Cardiovascular: Normal rate, regular rhythm, normal heart sounds and intact distal pulses.  Exam reveals no gallop and no friction rub.   No murmur heard. Pulmonary/Chest: Effort normal and breath sounds normal. No stridor. No respiratory distress. He has no wheezes. He has no rales. He exhibits no tenderness.  Abdominal: Soft. Bowel sounds are normal. He exhibits no distension and no mass. There is no tenderness. There is no rebound and no guarding.  Musculoskeletal: Normal range of motion. He exhibits no edema and no tenderness.  Lymphadenopathy:    He has no cervical adenopathy.  Neurological: He is oriented to person, place, and time.  Skin: Skin is warm and dry. No rash noted. He is not diaphoretic. No erythema. No pallor.  Psychiatric: He has a normal  mood and affect. His behavior is normal. Judgment and thought content normal.     Lab Results  Component Value Date   WBC 4.3 11/05/2012   HGB 12.5* 11/05/2012   HCT 37.3* 11/05/2012   PLT 107* 11/05/2012   GLUCOSE 97 11/05/2012   CHOL 211* 11/13/2012   TRIG 73.0 11/13/2012   HDL 44.70 11/13/2012   LDLDIRECT 155.8 11/13/2012   LDLCALC 84 03/28/2012   ALT 22 11/05/2012   AST 25 11/05/2012   NA 139 11/05/2012   K 4.0 11/05/2012   CL 105 11/05/2012   CREATININE 1.2 11/05/2012   BUN 20.4 11/05/2012   CO2 25 11/05/2012   TSH 8.57* 11/13/2012   PSA 0.46 09/27/2012   INR 0.90 02/08/2011   HGBA1C 5.4 03/27/2012       Assessment & Plan:

## 2013-01-10 NOTE — Telephone Encounter (Signed)
Patient Information:  Caller Name: Carrier  Phone: [phone]  Patient: Andre [name], Andre [name]  Gender: Male  DOB: 05/04/1951  Age: 62 Years  PCP: [name], Alex (Adults only)  Office Follow Up:  Does the office need to follow up with this patient?: No  Instructions For The Office: N/A  RN Note:  pt went to work this am and then came home because he felt so bad  Symptoms  Reason For Call & Symptoms: cold symptoms:  sore throat, runny nose and cough.  Yellow mucous  Reviewed Health History In EMR: Yes  Reviewed Medications In EMR: Yes  Reviewed Allergies In EMR: Yes  Reviewed Surgeries / Procedures: Yes  Date of Onset of Symptoms: 12/27/2012  Treatments Tried: ziacam, [facility] brand of thera flu  Treatments Tried Worked: No  Guideline(s) Used:  Colds  Disposition Per Guideline:   See Today or Tomorrow in Office  Reason For Disposition Reached:   Sinus congestion (pressure, fullness) present > 10 days  Advice Given:  N/A  Patient Will Follow Care Advice:  YES  Appointment Scheduled:  01/10/2013 10:00:00 Appointment Scheduled Provider:  Sanda Linger (Adults only)

## 2013-01-10 NOTE — Assessment & Plan Note (Signed)
I will check his TSH today and will adjust his dose if needed 

## 2013-01-10 NOTE — Patient Instructions (Signed)

## 2013-01-27 ENCOUNTER — Ambulatory Visit: Payer: Commercial Managed Care - PPO | Admitting: Lab

## 2013-01-27 DIAGNOSIS — C9000 Multiple myeloma not having achieved remission: Secondary | ICD-10-CM

## 2013-01-27 LAB — CBC WITH DIFFERENTIAL/PLATELET
BASO%: 0.7 % (ref 0.0–2.0)
EOS%: 5 % (ref 0.0–7.0)
HGB: 11.8 g/dL — ABNORMAL LOW (ref 13.0–17.1)
MCH: 32.1 pg (ref 27.2–33.4)
MCHC: 33.7 g/dL (ref 32.0–36.0)
RDW: 14.3 % (ref 11.0–14.6)
lymph#: 1.1 10*3/uL (ref 0.9–3.3)

## 2013-01-27 LAB — COMPREHENSIVE METABOLIC PANEL (CC13)
Albumin: 3.6 g/dL (ref 3.5–5.0)
Alkaline Phosphatase: 57 U/L (ref 40–150)
BUN: 16.7 mg/dL (ref 7.0–26.0)
Creatinine: 1.1 mg/dL (ref 0.7–1.3)
Glucose: 93 mg/dl (ref 70–99)
Total Bilirubin: 0.47 mg/dL (ref 0.20–1.20)

## 2013-01-28 ENCOUNTER — Ambulatory Visit (HOSPITAL_BASED_OUTPATIENT_CLINIC_OR_DEPARTMENT_OTHER): Payer: Commercial Managed Care - PPO

## 2013-01-28 ENCOUNTER — Telehealth: Payer: Self-pay | Admitting: Oncology

## 2013-01-28 ENCOUNTER — Other Ambulatory Visit: Payer: Commercial Managed Care - PPO | Admitting: Lab

## 2013-01-28 ENCOUNTER — Ambulatory Visit (HOSPITAL_BASED_OUTPATIENT_CLINIC_OR_DEPARTMENT_OTHER): Payer: Commercial Managed Care - PPO | Admitting: Oncology

## 2013-01-28 VITALS — BP 112/73 | HR 74 | Temp 98.4°F | Resp 20 | Ht 69.0 in | Wt 181.6 lb

## 2013-01-28 DIAGNOSIS — C9000 Multiple myeloma not having achieved remission: Secondary | ICD-10-CM

## 2013-01-28 LAB — KAPPA/LAMBDA LIGHT CHAINS
Kappa free light chain: 1.3 mg/dL (ref 0.33–1.94)
Kappa:Lambda Ratio: 0.84 (ref 0.26–1.65)
Lambda Free Lght Chn: 1.54 mg/dL (ref 0.57–2.63)

## 2013-01-28 LAB — PROTEIN / CREATININE RATIO, URINE: Creatinine, Urine: 30.5 mg/dL

## 2013-01-28 MED ORDER — ZOLEDRONIC ACID 4 MG/100ML IV SOLN
4.0000 mg | Freq: Once | INTRAVENOUS | Status: AC
  Administered 2013-01-28: 4 mg via INTRAVENOUS
  Filled 2013-01-28: qty 100

## 2013-01-28 MED ORDER — SODIUM CHLORIDE 0.9 % IJ SOLN
3.0000 mL | Freq: Once | INTRAMUSCULAR | Status: DC | PRN
  Filled 2013-01-28: qty 10

## 2013-01-28 MED ORDER — ZOLEDRONIC ACID 4 MG/5ML IV CONC: 4.0000 mg | Freq: Once | INTRAVENOUS | Status: DC

## 2013-01-28 NOTE — Progress Notes (Signed)
ID: Andre [name]   DOB: Feb 26, 1951  MR#: [id number]  HYQ#:[id number]   PCP: Sonda Primes SU: OTHER MD: Lesia Sago, Eddie Candle, Vivi Martens Brackbill  HISTORY OF PRESENT ILLNESS: The patient was worked up for peptic ulcer disease in August of 2011, with significant bleeding and anemia.  The patient was Helicobacter pylori negative.  He received some epinephrine when he had his EGD and then started on Protonix.  [name] anemia slowly resolved so that by September, his hemoglobin was up to 10.9 and by earlier this month, his hemoglobin was up to 12.5.    As part of his general workup, he was found to have a slightly elevated globulin fraction.  In September, his total protein was 8.3 with an albumin of 3.8.  In January, the total protein was 8.4 and albumin 3.6.  With persistence of this slight abnormality, Dr. Juanda Chance obtained serum immunofixation and SPEP.  The SPTP showed an M-spike of 2.67 grams.  A total IgG was 4,190.  Total IgA low at 28.  Total IgM low at 28 also.  The immunofixation showed a monoclonal IgG lambda paraprotein.  There were also monoclonal free lambda light chains present. With this information, the patient was referred for further evaluation. A diagnosis of myeloma was confirmed by bone marrow biopsy and subsequebnt treatment is as detailed below  INTERVAL HISTORY: Andre [name] returns today for followup of his multiple myeloma. The interval history is generally positive. He is working full-time. He swimming and half hour twice a week and runs/walks 2-3 miles 3-4 times a week. His social life has improved as well. Finally, his daughter Ing is pregnant.   REVIEW OF SYSTEMS: He is less confused and forgetful than before, and also less depressed. He has occasional headaches. He had quite a few "cold" this winter, leading to a significant sinus infection which required antibiotics to clear. He is now on daily Claritin. His thyroid medication was recently  increased slightly. He is sleeping better. Overall the picture on getting is that he is getting back to his normal, although he is only about 85% there. A detailed review of systems was otherwise noncontributory  PAST MEDICAL HISTORY: Past Medical History  Diagnosis Date  . Hypertension   . Hyperlipidemia   . Duodenal ulcer   . Multiple myeloma(203.0) 07/04/2011  . Allergy   Significant for peptic ulcer disease as noted above.  History of hyperlipidemia.  History of anxiety and depression.  History of GERD which is significantly improved with weight loss and history of reactive airway disease, possibly secondary to the GERD which also improved with weight loss.  There was a history of obesity, now much improved secondary to exercise and diet.   PAST SURGICAL HISTORY: Past Surgical History  Procedure Laterality Date  . Cardiolite study  11/25/2003    NORMAL  . Bone marrow transplant  2011    for MM    FAMILY HISTORY Family History  Problem Relation Age of Onset  . Cancer Mother   . Hypertension Father   . Stroke Father   . Asthma Father   . Diabetes Father   [name] father died at the age of 67 following a stroke.  [name] mother died with cancer of the throat at the age of 110.   She had a history of depression and was a smoker.  Not clear how much alcohol she drank, according to the patient.  The patient had two brothers.  One died from the age  of 6 from a "kidney problem."  The second one died at the age of 50 from pancreatic cancer. (This may have been duodenal cancer. 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  His wife of 37 years is [name].  Unfortunately, they are in the process of divorce. The patient has two daughters:  [name] who is 34, and Hoselton who is 70.  They both live here in  [place]. 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  The patient is very close to his daughters.  There are no grandchildren.  The patient is not a church attender.  He does derive a great deal of support from friends at the "[employer]" which he attends very regularly, and also participates in Al-Anon even though actually there is no alcohol or drug problem in the family.  He participates in this group and he gets quite a bit of support from it.      ADVANCED DIRECTIVES: In place  HEALTH MAINTENANCE: History  Substance Use Topics  . Smoking status: Former Smoker -- 3 years    Quit date: 03/27/1969  . Smokeless tobacco: Never Used  . Alcohol Use: No     Colonoscopy:  PSA:  Lipid panel:  Allergies  Allergen Reactions  . Crestor (Rosuvastatin Calcium)   . Lipitor (Atorvastatin Calcium)     Current Outpatient Prescriptions  Medication Sig Dispense Refill  . aspirin 81 MG tablet Take 1 tablet (81 mg total) by mouth daily.  30 tablet    . cholecalciferol (VITAMIN D) 1000 UNITS tablet Take 1 tablet (1,000 Units total) by mouth daily.  100 tablet  3  . escitalopram (LEXAPRO) 10 MG tablet Take 1 tablet (10 mg total) by mouth daily.  30 tablet  5  . ezetimibe (ZETIA) 10 MG tablet Take 1 tablet (10 mg total) by mouth daily.  90 tablet  3  . fish oil-omega-3 fatty acids 1000 MG capsule Take 2 g by mouth daily.       . Hydrocod Polst-Chlorphen Polst 10-8 MG CP12 Take 1 tablet by mouth 2 (two) times daily as needed.  25 each  0  . levothyroxine (SYNTHROID, LEVOTHROID) 88 MCG tablet Take 1 tablet (88 mcg total) by mouth daily.  90 tablet  1  . loratadine (CLARITIN) 10 MG tablet Take 1 tablet (10 mg total) by mouth daily.      [name] moxifloxacin (AVELOX) 400 MG tablet Take 1 tablet (400 mg total) by mouth daily.  10 tablet  0  . omeprazole (PRILOSEC) 40 MG capsule Take 1 capsule (40 mg total) by mouth daily.  30 capsule  5  . [DISCONTINUED] fexofenadine (ALLEGRA) 60 MG tablet Take 180 mg by mouth daily.          No current facility-administered medications for this visit.    OBJECTIVE: Middle-aged white male who appears well Filed Vitals:   01/28/13 1356  BP: 112/73  Pulse: 74  Temp: 98.4 F (36.9 [name])  Resp: 20     Body mass index is 26.81 kg/(m^2).    ECOG FS: 0  Sclerae unicteric Oropharynx clear No cervical or supraclavicular adenopathy Lungs no rales or rhonchi Heart regular rate and rhythm Abd soft, nontender, positive bowel sounds MSK no focal spinal tenderness, no peripheral edema Neuro: nonfocal; well oriented; appropriate affect  LAB RESULTS:  Repeat SPEP and light chains yesterday are pending  Lab Results  Component Value Date   WBC 4.7 01/27/2013   NEUTROABS 2.7 01/27/2013   HGB 11.8* 01/27/2013   HCT 35.1* 01/27/2013   MCV 95.2 01/27/2013   PLT 165 01/27/2013      Chemistry      Component Value Date/Time   NA 139 01/27/2013 1322   NA 140 04/10/2012 1123   K 4.1 01/27/2013 1322   K 4.2 04/10/2012 1123   CL 106 01/27/2013 1322   CL 103 04/10/2012 1123   CO2 25 01/27/2013 1322   CO2 26 04/10/2012 1123   BUN 16.7 01/27/2013 1322   BUN 16 04/10/2012 1123   CREATININE 1.1 01/27/2013 1322   CREATININE 1.49* 04/10/2012 1123      Component Value Date/Time   CALCIUM 9.4 01/27/2013 1322   CALCIUM 9.9 04/10/2012 1123   ALKPHOS 57 01/27/2013 1322   ALKPHOS 38* 09/27/2012 1132   AST 23 01/27/2013 1322   AST 25 09/27/2012 1132   ALT 15 01/27/2013 1322   ALT 26 09/27/2012 1132   BILITOT 0.47 01/27/2013 1322   BILITOT 0.6 09/27/2012 1132       No results found for this basename: LABCA2    No components found with this basename: LABCA125    No results found for this basename: INR,  in the last 168 hours  Urinalysis    Component Value Date/Time   COLORURINE YELLOW 09/27/2012 1132   APPEARANCEUR CLEAR 09/27/2012 1132   LABSPEC 1.015 09/27/2012 1132   PHURINE 6.0 09/27/2012 1132   GLUCOSEU NEGATIVE 05/02/2010 0957   HGBUR SMALL 09/27/2012 1132   BILIRUBINUR NEGATIVE 09/27/2012 1132    KETONESUR NEGATIVE 09/27/2012 1132   PROTEINUR NEGATIVE 05/02/2010 0957   UROBILINOGEN 0.2 09/27/2012 1132   NITRITE NEGATIVE 09/27/2012 1132   LEUKOCYTESUR NEGATIVE 09/27/2012 1132    STUDIES: Dg Chest 2 View  01/10/2013  *RADIOLOGY REPORT*  Clinical Data: Cough.  CHEST - 2 VIEW  Comparison: Chest CT 10/15/2012  Findings: Mild peribronchial thickening. Heart and mediastinal contours are within normal limits.  No focal opacities or effusions.  No acute bony abnormality.  IMPRESSION: Mild bronchitic changes.   Original Report Authenticated By: Charlett Nose, M.D.     ASSESSMENT: 62 y.o.  [place] man with a history of multiple myeloma diagnosed February of 2012, with an initial M-spike of 2.6 g/dL, IFE showing an IgG lambda paraprotein and free lambda light chains in the urine. Cytogenetics showed trisomy 3. Bone marrow biopsy showed a 22% plasmacytosis. Treated with   (1) bortezomib (subcutaneously), lenalidomide, and dexamethasone, with repeat bone marrow biopsy May of 2012 showing 10% plasmacytosis   (2) high-dose chemotherapy with BCNU and melphalan at [facility], followed by stem cell rescue July of 2012   (3) on zoledronic acid started December of 2012, initially monthly, currently every 3 months, most recent dose 01/28/2013  (4) low-dose lenalidomide resumed April 2013, interrupted several times since, currently off  (5) CNS symptoms and abnormal brain MRI extensively evaluated by neurology with no definitive diagnosis established; improving    PLAN: Andre [name] is doing much better overall clinically, and I think he is ready now to start lenalidomide, perhaps add low-dose. He is already scheduled to meet with Dr. Barbaraann Boys later this week. He will bring yesterday's labs for her to evaluate. I am going to see him again in 2 months. He knows to call for any problems that may develop before that visit.  Andre [name]  01/28/2013   

## 2013-01-28 NOTE — Patient Instructions (Addendum)
Zoledronic Acid injection (Hypercalcemia, Oncology) What is this medicine? ZOLEDRONIC ACID (ZOE le dron ik AS id) lowers the amount of calcium loss from bone. It is used to treat too much calcium in your blood from cancer. It is also used to prevent complications of cancer that has spread to the bone. This medicine may be used for other purposes; ask your health care provider or pharmacist if you have questions. What should I tell my health care provider before I take this medicine? They need to know if you have any of these conditions: -aspirin-sensitive asthma -dental disease -kidney disease -an unusual or allergic reaction to zoledronic acid, other medicines, foods, dyes, or preservatives -pregnant or trying to get pregnant -breast-feeding How should I use this medicine? This medicine is for infusion into a vein. It is given by a health care professional in a hospital or clinic setting. Talk to your pediatrician regarding the use of this medicine in children. Special care may be needed. Overdosage: If you think you have taken too much of this medicine contact a poison control center or emergency room at once. NOTE: This medicine is only for you. Do not share this medicine with others. What if I miss a dose? It is important not to miss your dose. Call your doctor or health care professional if you are unable to keep an appointment. What may interact with this medicine? -certain antibiotics given by injection -NSAIDs, medicines for pain and inflammation, like ibuprofen or naproxen -some diuretics like bumetanide, furosemide -teriparatide -thalidomide This list may not describe all possible interactions. Give your health care provider a list of all the medicines, herbs, non-prescription drugs, or dietary supplements you use. Also tell them if you smoke, drink alcohol, or use illegal drugs. Some items may interact with your medicine. What should I watch for while using this medicine? Visit  your doctor or health care professional for regular checkups. It may be some time before you see the benefit from this medicine. Do not stop taking your medicine unless your doctor tells you to. Your doctor may order blood tests or other tests to see how you are doing. Women should inform their doctor if they wish to become pregnant or think they might be pregnant. There is a potential for serious side effects to an unborn child. Talk to your health care professional or pharmacist for more information. You should make sure that you get enough calcium and vitamin D while you are taking this medicine. Discuss the foods you eat and the vitamins you take with your health care professional. Some people who take this medicine have severe bone, joint, and/or muscle pain. This medicine may also increase your risk for a broken thigh bone. Tell your doctor right away if you have pain in your upper leg or groin. Tell your doctor if you have any pain that does not go away or that gets worse. What side effects may I notice from receiving this medicine? Side effects that you should report to your doctor or health care professional as soon as possible: -allergic reactions like skin rash, itching or hives, swelling of the face, lips, or tongue -anxiety, confusion, or depression -breathing problems -changes in vision -feeling faint or lightheaded, falls -jaw burning, cramping, pain -muscle cramps, stiffness, or weakness -trouble passing urine or change in the amount of urine Side effects that usually do not require medical attention (report to your doctor or health care professional if they continue or are bothersome): -bone, joint, or muscle pain -  fever -hair loss -irritation at site where injected -loss of appetite -nausea, vomiting -stomach upset -tired This list may not describe all possible side effects. Call your doctor for medical advice about side effects. You may report side effects to FDA at  1-800-FDA-1088. Where should I keep my medicine? This drug is given in a hospital or clinic and will not be stored at home. NOTE: This sheet is a summary. It may not cover all possible information. If you have questions about this medicine, talk to your doctor, pharmacist, or health care provider.  2013, [employer]. (03/03/2011 9:06:58 AM)  

## 2013-01-29 ENCOUNTER — Other Ambulatory Visit: Payer: Self-pay | Admitting: *Deleted

## 2013-01-29 ENCOUNTER — Telehealth: Payer: Self-pay | Admitting: *Deleted

## 2013-01-29 DIAGNOSIS — C9000 Multiple myeloma not having achieved remission: Secondary | ICD-10-CM

## 2013-01-29 NOTE — Telephone Encounter (Signed)
Per staff message and POF I have scheduled appts.  JMW  

## 2013-01-30 ENCOUNTER — Other Ambulatory Visit: Payer: Self-pay | Admitting: Oncology

## 2013-01-31 LAB — SPEP & IFE WITH QIG
Alpha-1-Globulin: 3.9 % (ref 2.9–4.9)
Beta 2: 3 % — ABNORMAL LOW (ref 3.2–6.5)
Gamma Globulin: 10.3 % — ABNORMAL LOW (ref 11.1–18.8)
IgM, Serum: 34 mg/dL — ABNORMAL LOW (ref 41–251)

## 2013-02-04 ENCOUNTER — Telehealth: Payer: Self-pay | Admitting: *Deleted

## 2013-02-04 ENCOUNTER — Other Ambulatory Visit: Payer: Self-pay | Admitting: Oncology

## 2013-02-04 ENCOUNTER — Other Ambulatory Visit: Payer: Self-pay | Admitting: *Deleted

## 2013-02-04 MED ORDER — LENALIDOMIDE 5 MG PO CAPS: 5.0000 mg | ORAL_CAPSULE | Freq: Every day | ORAL | Status: DC

## 2013-02-04 NOTE — Telephone Encounter (Signed)
sw pt [facility] appt d/t for 03/10/13 lab @ 1:45pm and ov @ 2:15pm. Pt is aware...td

## 2013-02-04 NOTE — Telephone Encounter (Signed)
Per MD this RN spoke with pt regarding SPEP result with noted M spike now appearing. Andre [name] states per Dr Barbaraann Boys recommedation if M spike return to resume revlimid at 5mg  ( lower dose then before ).  Prescription obtained with authorization number per [employer].  Prescription faxed to [facility] at [phone].

## 2013-02-06 ENCOUNTER — Ambulatory Visit (INDEPENDENT_AMBULATORY_CARE_PROVIDER_SITE_OTHER): Payer: Commercial Managed Care - PPO | Admitting: Internal Medicine

## 2013-02-06 ENCOUNTER — Encounter: Payer: Self-pay | Admitting: Internal Medicine

## 2013-02-06 ENCOUNTER — Other Ambulatory Visit: Payer: Self-pay | Admitting: *Deleted

## 2013-02-06 VITALS — BP 112/72 | HR 72 | Temp 98.3°F | Resp 16 | Wt 180.0 lb

## 2013-02-06 DIAGNOSIS — F341 Dysthymic disorder: Secondary | ICD-10-CM

## 2013-02-06 DIAGNOSIS — K222 Esophageal obstruction: Secondary | ICD-10-CM

## 2013-02-06 DIAGNOSIS — E039 Hypothyroidism, unspecified: Secondary | ICD-10-CM

## 2013-02-06 DIAGNOSIS — F329 Major depressive disorder, single episode, unspecified: Secondary | ICD-10-CM

## 2013-02-06 DIAGNOSIS — R5383 Other fatigue: Secondary | ICD-10-CM

## 2013-02-06 DIAGNOSIS — E559 Vitamin D deficiency, unspecified: Secondary | ICD-10-CM

## 2013-02-06 DIAGNOSIS — C9000 Multiple myeloma not having achieved remission: Secondary | ICD-10-CM

## 2013-02-06 DIAGNOSIS — R5381 Other malaise: Secondary | ICD-10-CM

## 2013-02-06 NOTE — Assessment & Plan Note (Signed)
Per Dr Magrinat 

## 2013-02-06 NOTE — Assessment & Plan Note (Signed)
Better Continue with current prescription therapy as reflected on the Med list.  

## 2013-02-06 NOTE — Progress Notes (Signed)
   Subjective:     HPI   Pt presents for a f/u  chronic hypertension that has been well controlled with medicines; to address chronic fatigue, insomnia,  hyperlipidemia controlled with medicines as well; and to address MM - re-occurred, anxiety.  F/u depression - better. F/u hypogonadism and low testosterone  BP Readings from Last 3 Encounters:  02/06/13 112/72  01/28/13 112/73  01/10/13 118/74   Wt Readings from Last 3 Encounters:  02/06/13 180 lb (81.647 kg)  01/28/13 181 lb 9.6 oz (82.373 kg)  01/10/13 182 lb (82.555 kg)       Review of Systems  Constitutional: Positive for fatigue. Negative for diaphoresis, appetite change and unexpected weight change.  HENT: Negative for nosebleeds, congestion, sore throat, facial swelling, sneezing, trouble swallowing and neck pain.   Eyes: Negative for itching and visual disturbance.  Respiratory: Negative for cough, chest tightness and wheezing.   Cardiovascular: Negative for chest pain, palpitations and leg swelling.  Gastrointestinal: Negative for nausea, vomiting, diarrhea, blood in stool and abdominal distention.  Genitourinary: Negative for dysuria, urgency, frequency and hematuria.  Musculoskeletal: Negative for back pain, joint swelling and gait problem.  Skin: Negative for rash.  Neurological: Positive for weakness. Negative for dizziness, tremors and speech difficulty.  Psychiatric/Behavioral: Positive for dysphoric mood. Negative for suicidal ideas, sleep disturbance and agitation. The patient is nervous/anxious.        Objective:   Physical Exam  Constitutional: He is oriented to person, place, and time. He appears well-developed. No distress.  Appears tired  HENT:  Mouth/Throat: Oropharynx is clear and moist.  Eyes: Conjunctivae are normal. Pupils are equal, round, and reactive to light.  Neck: Normal range of motion. No JVD present. No thyromegaly present.  Cardiovascular: Normal rate, regular rhythm, normal heart  sounds and intact distal pulses.  Exam reveals no gallop and no friction rub.   No murmur heard. Pulmonary/Chest: Effort normal and breath sounds normal. No respiratory distress. He has no wheezes. He has no rales. He exhibits no tenderness.  Abdominal: Soft. Bowel sounds are normal. He exhibits no distension and no mass. There is no tenderness. There is no rebound and no guarding.  Musculoskeletal: Normal range of motion. He exhibits no edema and no tenderness.  Lymphadenopathy:    He has no cervical adenopathy.  Neurological: He is alert and oriented to person, place, and time. He has normal reflexes. No cranial nerve deficit. He exhibits normal muscle tone. Coordination normal.  Skin: Skin is warm and dry. No rash noted.  Psychiatric: He has a normal mood and affect. His behavior is normal. Judgment and thought content normal.    Lab Results  Component Value Date   WBC 4.7 01/27/2013   HGB 11.8* 01/27/2013   HCT 35.1* 01/27/2013   PLT 165 01/27/2013   GLUCOSE 93 01/27/2013   CHOL 211* 11/13/2012   TRIG 73.0 11/13/2012   HDL 44.70 11/13/2012   LDLDIRECT 155.8 11/13/2012   LDLCALC 84 03/28/2012   ALT 15 01/27/2013   AST 23 01/27/2013   NA 139 01/27/2013   K 4.1 01/27/2013   CL 106 01/27/2013   CREATININE 1.1 01/27/2013   BUN 16.7 01/27/2013   CO2 25 01/27/2013   TSH 5.79* 01/10/2013   PSA 0.46 09/27/2012   INR 0.90 02/08/2011   HGBA1C 5.4 03/27/2012    Chart reviewed      Assessment & Plan:

## 2013-02-06 NOTE — Assessment & Plan Note (Signed)
No sx's Continue with current prescription therapy as reflected on the Med list.  

## 2013-02-06 NOTE — Assessment & Plan Note (Signed)
Continue with current prescription therapy as reflected on the Med list.  

## 2013-02-06 NOTE — Assessment & Plan Note (Signed)
Daytime tiredness - - power nap

## 2013-02-11 ENCOUNTER — Encounter: Payer: Self-pay | Admitting: Oncology

## 2013-02-17 ENCOUNTER — Other Ambulatory Visit: Payer: Self-pay | Admitting: *Deleted

## 2013-02-17 MED ORDER — LENALIDOMIDE 5 MG PO CAPS: 5.0000 mg | ORAL_CAPSULE | Freq: Every day | ORAL | Status: DC

## 2013-02-18 ENCOUNTER — Other Ambulatory Visit: Payer: Self-pay | Admitting: *Deleted

## 2013-02-25 ENCOUNTER — Emergency Department (HOSPITAL_COMMUNITY): Payer: Commercial Managed Care - PPO

## 2013-02-25 ENCOUNTER — Encounter (HOSPITAL_COMMUNITY): Payer: Self-pay | Admitting: Emergency Medicine

## 2013-02-25 ENCOUNTER — Emergency Department (HOSPITAL_COMMUNITY)
Admission: EM | Admit: 2013-02-25 | Discharge: 2013-02-25 | Disposition: A | Discharge status: Home / Self Care | Payer: Commercial Managed Care - PPO | Attending: Emergency Medicine | Admitting: Emergency Medicine

## 2013-02-25 DIAGNOSIS — Z7982 Long term (current) use of aspirin: Secondary | ICD-10-CM | POA: Insufficient documentation

## 2013-02-25 DIAGNOSIS — M545 Low back pain, unspecified: Secondary | ICD-10-CM | POA: Insufficient documentation

## 2013-02-25 DIAGNOSIS — I10 Essential (primary) hypertension: Secondary | ICD-10-CM | POA: Insufficient documentation

## 2013-02-25 DIAGNOSIS — Z87891 Personal history of nicotine dependence: Secondary | ICD-10-CM | POA: Insufficient documentation

## 2013-02-25 DIAGNOSIS — K260 Acute duodenal ulcer with hemorrhage: Secondary | ICD-10-CM | POA: Insufficient documentation

## 2013-02-25 DIAGNOSIS — Z79899 Other long term (current) drug therapy: Secondary | ICD-10-CM | POA: Insufficient documentation

## 2013-02-25 DIAGNOSIS — M549 Dorsalgia, unspecified: Secondary | ICD-10-CM

## 2013-02-25 DIAGNOSIS — Z862 Personal history of diseases of the blood and blood-forming organs and certain disorders involving the immune mechanism: Secondary | ICD-10-CM | POA: Insufficient documentation

## 2013-02-25 DIAGNOSIS — Z85830 Personal history of malignant neoplasm of bone: Secondary | ICD-10-CM | POA: Insufficient documentation

## 2013-02-25 DIAGNOSIS — Z8639 Personal history of other endocrine, nutritional and metabolic disease: Secondary | ICD-10-CM | POA: Insufficient documentation

## 2013-02-25 MED ORDER — CYCLOBENZAPRINE HCL 10 MG PO TABS: 10.0000 mg | ORAL_TABLET | Freq: Three times a day (TID) | ORAL | Status: DC | PRN

## 2013-02-25 MED ORDER — IBUPROFEN 800 MG PO TABS: 800.0000 mg | ORAL_TABLET | Freq: Three times a day (TID) | ORAL | Status: DC

## 2013-02-25 NOTE — ED Notes (Addendum)
Pt c/o lower back pain that started about a week ago, states he did a couple of exercises and it got a little better but today when he got up from working it was across his whole lower back. Took 3 ibuprofen about 45 mins ago with some relief.

## 2013-02-26 NOTE — ED Provider Notes (Signed)
History     CSN: [id number]  Arrival date & time 02/25/13  1244   First MD Initiated Contact with Patient 02/25/13 1310      Chief Complaint  Patient presents with  . Back Pain    (Consider location/radiation/quality/duration/timing/severity/associated sxs/prior treatment) Patient is a 62 y.o. male presenting with back pain. The history is provided by the patient (the pt complains of back pain radiating down his left leg).  Back Pain Location:  Lumbar spine Quality:  Aching Radiates to:  L posterior upper leg Pain severity:  Moderate Pain is:  Worse during the day Onset quality:  Gradual Timing:  Constant Chronicity:  New Associated symptoms: no abdominal pain, no chest pain and no headaches     Past Medical History  Diagnosis Date  . Hypertension   . Hyperlipidemia   . Duodenal ulcer   . Multiple myeloma 07/04/2011  . Allergy     Past Surgical History  Procedure Laterality Date  . Cardiolite study  11/25/2003    NORMAL  . Bone marrow transplant  2011    for MM    Family History  Problem Relation Age of Onset  . Cancer Mother   . Hypertension Father   . Stroke Father   . Asthma Father   . Diabetes Father     History  Substance Use Topics  . Smoking status: Former Smoker -- 3 years    Quit date: 03/27/1969  . Smokeless tobacco: Never Used  . Alcohol Use: No      Review of Systems  Constitutional: Negative for appetite change and fatigue.  HENT: Negative for congestion, sinus pressure and ear discharge.   Eyes: Negative for discharge.  Respiratory: Negative for cough.   Cardiovascular: Negative for chest pain.  Gastrointestinal: Negative for abdominal pain and diarrhea.  Genitourinary: Negative for frequency and hematuria.  Musculoskeletal: Positive for back pain.  Skin: Negative for rash.  Neurological: Negative for seizures and headaches.  Psychiatric/Behavioral: Negative for hallucinations.    Allergies  Crestor and Lipitor  Home  Medications   Current Outpatient Rx  Name  Route  Sig  Dispense  Refill  . aspirin 81 MG tablet   Oral   Take 1 tablet (81 mg total) by mouth daily.   30 tablet        AS DIRECTED PER DR MAGRINAT   . cholecalciferol (VITAMIN D) 1000 UNITS tablet   Oral   Take 1 tablet (1,000 Units total) by mouth daily.   100 tablet   3   . diphenhydramine-acetaminophen (TYLENOL PM) 25-500 MG TABS   Oral   Take 0.5 tablets by mouth at bedtime as needed.         [name] escitalopram (LEXAPRO) 10 MG tablet   Oral   Take 1 tablet (10 mg total) by mouth daily.   30 tablet   5   . ezetimibe (ZETIA) 10 MG tablet   Oral   Take 1 tablet (10 mg total) by mouth daily.   90 tablet   3   . ibuprofen (ADVIL,MOTRIN) 200 MG tablet   Oral   Take 600 mg by mouth every 6 (six) hours as needed for pain.         [name] lenalidomide (REVLIMID) 5 MG capsule   Oral   Take 1 capsule (5 mg total) by mouth daily. Take 5mg  daily x 21 days then off x 7 days   21 capsule   0   . levothyroxine (SYNTHROID, LEVOTHROID) 88 MCG  tablet   Oral   Take 1 tablet (88 mcg total) by mouth daily.   90 tablet   1   . loratadine (CLARITIN) 10 MG tablet   Oral   Take 1 tablet (10 mg total) by mouth daily.         [name] omeprazole (PRILOSEC) 40 MG capsule   Oral   Take 1 capsule (40 mg total) by mouth daily.   30 capsule   5   . cyclobenzaprine (FLEXERIL) 10 MG tablet   Oral   Take 1 tablet (10 mg total) by mouth 3 (three) times daily as needed for muscle spasms.   30 tablet   0   . Hydrocod Polst-Chlorphen Polst 10-8 MG CP12   Oral   Take 1 tablet by mouth 2 (two) times daily as needed.   25 each   0   . ibuprofen (ADVIL,MOTRIN) 800 MG tablet   Oral   Take 1 tablet (800 mg total) by mouth 3 (three) times daily.   30 tablet   0     BP 146/79  Pulse 72  Temp(Src) 98.5 F (36.9 C) (Oral)  Resp 20  SpO2 100%  Physical Exam  Constitutional: He is oriented to person, place, and time. He appears  well-developed.  HENT:  Head: Normocephalic.  Eyes: Conjunctivae and EOM are normal. No scleral icterus.  Neck: Neck supple. No thyromegaly present.  Cardiovascular: Normal rate and regular rhythm.  Exam reveals no gallop and no friction rub.   No murmur heard. Pulmonary/Chest: No stridor. He has no wheezes. He has no rales. He exhibits no tenderness.  Abdominal: He exhibits no distension. There is no tenderness. There is no rebound.  Musculoskeletal: Normal range of motion. He exhibits no edema.  Tender lumbar spine with pos straight raising on left  Lymphadenopathy:    He has no cervical adenopathy.  Neurological: He is oriented to person, place, and time. Coordination normal.  Skin: No rash noted. No erythema.  Psychiatric: He has a normal mood and affect. His behavior is normal.    ED Course  Procedures (including critical care time)  Labs Reviewed - No data to display Dg Lumbar Spine Complete  02/25/2013   *RADIOLOGY REPORT*  Clinical Data: Low back pain, history of sacral fracture 1 year ago  LUMBAR SPINE - COMPLETE 4+ VIEW  Comparison: MR pelvis of 05/13/2012  Findings: The lumbar vertebrae are in normal alignment.  No compression deformity is seen.  There is mild degenerative disc disease at L2-3 and L3-4 levels with slight loss of disc space and spurring.  The SI joints appear normal.  Some sclerosis is noted in the right sacrum which may be due to healing from prior study of fracture.  IMPRESSION: Normal alignment with mild degenerative disc disease at L3-4 and L4- 5 levels.  No acute compression deformity.   Original Report Authenticated By: Dwyane Dee, M.D.     1. Back pain       MDM          Benny Lennert, MD 02/26/13 1435

## 2013-03-10 ENCOUNTER — Other Ambulatory Visit: Payer: Commercial Managed Care - PPO | Admitting: Lab

## 2013-03-10 ENCOUNTER — Telehealth: Payer: Self-pay | Admitting: *Deleted

## 2013-03-10 ENCOUNTER — Encounter: Payer: Self-pay | Admitting: Physician Assistant

## 2013-03-10 ENCOUNTER — Ambulatory Visit (HOSPITAL_BASED_OUTPATIENT_CLINIC_OR_DEPARTMENT_OTHER): Payer: Commercial Managed Care - PPO | Admitting: Physician Assistant

## 2013-03-10 VITALS — BP 107/68 | HR 101 | Temp 97.8°F | Resp 20 | Ht 69.0 in | Wt 181.0 lb

## 2013-03-10 DIAGNOSIS — C9000 Multiple myeloma not having achieved remission: Secondary | ICD-10-CM

## 2013-03-10 DIAGNOSIS — D649 Anemia, unspecified: Secondary | ICD-10-CM

## 2013-03-10 LAB — COMPREHENSIVE METABOLIC PANEL (CC13)
ALT: 39 U/L (ref 0–55)
CO2: 24 mEq/L (ref 22–29)
Calcium: 8.9 mg/dL (ref 8.4–10.4)
Chloride: 107 mEq/L (ref 98–107)
Creatinine: 1.1 mg/dL (ref 0.7–1.3)
Glucose: 99 mg/dl (ref 70–99)
Total Bilirubin: 0.37 mg/dL (ref 0.20–1.20)
Total Protein: 6.5 g/dL (ref 6.4–8.3)

## 2013-03-10 LAB — CBC WITH DIFFERENTIAL/PLATELET
Basophils Absolute: 0 10*3/uL (ref 0.0–0.1)
EOS%: 8 % — ABNORMAL HIGH (ref 0.0–7.0)
Eosinophils Absolute: 0.3 10*3/uL (ref 0.0–0.5)
HGB: 12 g/dL — ABNORMAL LOW (ref 13.0–17.1)
NEUT#: 1.9 10*3/uL (ref 1.5–6.5)
RDW: 14.7 % — ABNORMAL HIGH (ref 11.0–14.6)
lymph#: 1 10*3/uL (ref 0.9–3.3)

## 2013-03-10 NOTE — Telephone Encounter (Signed)
Per staff phone call and POF I have schedueld appts.  JMW  

## 2013-03-10 NOTE — Progress Notes (Signed)
ID: Andre [name]   DOB: 12-21-1950  MR#: [id number]  CSN#:[id number]   PCP: Andre [name]: OTHER MD: Andre [name], Andre [name], Andre [name]  HISTORY OF PRESENT ILLNESS: The patient was worked up for peptic ulcer disease in August of 2011, with significant bleeding and anemia.  The patient was Helicobacter pylori negative.  He received some epinephrine when he had his EGD and then started on Protonix.  [name] anemia slowly resolved so that by September, his hemoglobin was up to 10.9 and by earlier this month, his hemoglobin was up to 12.5.    As part of his general workup, he was found to have a slightly elevated globulin fraction.  In September, his total protein was 8.3 with an albumin of 3.8.  In January, the total protein was 8.4 and albumin 3.6.  With persistence of this slight abnormality, Dr. Juanda [name] obtained serum immunofixation and SPEP.  The SPTP showed an M-spike of 2.67 grams.  A total IgG was 4,190.  Total IgA low at 28.  Total IgM low at 28 also.  The immunofixation showed a monoclonal IgG lambda paraprotein.  There were also monoclonal free lambda light chains present. With this information, the patient was referred for further evaluation. A diagnosis of myeloma was confirmed by bone marrow biopsy and subsequebnt treatment is as detailed below  INTERVAL HISTORY: Andre [name] returns today for followup of his multiple myeloma. The interval history is stable. He begin low-dose lenalidomide on June 4, and is currently day 19 cycle one (he takes this 21 days on and 7 days off).   The big news is that his daughter Andre [name] gave birth to his first granddaughter Andre [name]") last Thursday, June 19, and they're both doing well!  Andre [name] himself is working full-time. He continues to exercise on a regular basis. He is now walking 5 or 6 days per week, usually 2-3 miles. He has had some increased lower back pain which has interrupted his running, and some shoulder pain that  interrupted his swimming.   REVIEW OF SYSTEMS: Physically, Andre [name] has had some continued problems concentrating, and still finds himself to be forgetful. He continues on Andre [name], 10 mg daily, which he feels is helping with the depression. His energy level is low, and in fact he takes at least one nap  every day. His vision is still blurry, and he tells me he had one "episode" last week where his vision was very blurred and "things felt like they were jumping around" .  He sat still, rested, and drink water until the symptoms resolved, and he was able to drive himself home with no residual symptoms. He has occasional headaches, and he does think that these increased after resuming the lenalidomide. He takes ibuprofen or Tylenol which he finds helpful.   He's eating and drinking fairly well, and denies any nausea. He has mild constipation and manages daily bowel movements. He's had no blood in the stool, and in fact denies any abnormal bleeding elsewhere. His previous peripheral neuropathy continues to improve.  A detailed review of systems is otherwise stable and noncontributory.   PAST MEDICAL HISTORY: Past Medical History  Diagnosis Date  . Hypertension   . Hyperlipidemia   . Duodenal ulcer   . Multiple myeloma 07/04/2011  . Allergy   Significant for peptic ulcer disease as noted above.  History of hyperlipidemia.  History of anxiety and depression.  History of GERD which is significantly improved with weight loss and history of reactive airway  disease, possibly secondary to the GERD which also improved with weight loss.  There was a history of obesity, now much improved secondary to exercise and diet.   PAST SURGICAL HISTORY: Past Surgical History  Procedure Laterality Date  . Cardiolite study  11/25/2003    NORMAL  . Bone marrow transplant  2011    for MM    FAMILY HISTORY Family History  Problem Relation Age of Onset  . Cancer Mother   . Hypertension Father   . Stroke Father   .  Asthma Father   . Diabetes Father   [name] father died at the age of 43 following a stroke.  [name] mother died with cancer of the throat at the age of 44.   She had a history of depression and was a smoker.  Not clear how much alcohol she drank, according to the patient.  The patient had two brothers.  One died from the age of 6 from a "kidney problem."  The second one died at the age of 57 from pancreatic cancer. (This may have been duodenal cancer. 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  His wife of 37 years is Andre [name].  Unfortunately, they are in the process of divorce. The patient has two daughters:  Andre [name] who is 66, and Andre [name] who is 50.  They both live here in [place]. 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  The patient is very close to his daughters.  Andre [name] has one daughter, Andre [name], born 03/06/2013.  The patient is not a church attender.  He does derive a great deal of support from friends at the "[employer]" which he attends very regularly, and also participates in Al-Anon even though actually there is no alcohol or drug problem in the family.  He participates in this group and he gets quite a bit of support from it.      ADVANCED DIRECTIVES: In place  HEALTH MAINTENANCE: History  Substance Use Topics  . Smoking status: Former Smoker -- 3 years    Quit date: 03/27/1969  . Smokeless tobacco: Never Used  . Alcohol Use: No     Colonoscopy:  PSA:  Lipid panel:  Allergies  Allergen Reactions  . Crestor (Rosuvastatin Calcium)     ADVERSE EFFECTS ON LIVER  . Lipitor (Atorvastatin Calcium)     Current Outpatient Prescriptions  Medication Sig Dispense Refill  . aspirin 81 MG tablet Take 1 tablet (81 mg total) by mouth daily.  30 tablet    . cholecalciferol (VITAMIN D) 1000 UNITS tablet Take 1  tablet (1,000 Units total) by mouth daily.  100 tablet  3  . diphenhydramine-acetaminophen (TYLENOL PM) 25-500 MG TABS Take 0.5 tablets by mouth at bedtime as needed.      [name] escitalopram (Andre [name]) 10 MG tablet Take 1 tablet (10 mg total) by mouth daily.  30 tablet  5  . ezetimibe (ZETIA) 10 MG tablet Take 1 tablet (10 mg total) by mouth daily.  90 tablet  3  . ibuprofen (ADVIL,MOTRIN) 200 MG tablet Take 600 mg by mouth every 6 (six) hours as needed for pain.      [name] ibuprofen (ADVIL,MOTRIN) 800 MG tablet Take 1 tablet (800 mg total) by mouth 3 (three) times daily.  30 tablet  0  . lenalidomide (REVLIMID) 5 MG  capsule Take 1 capsule (5 mg total) by mouth daily. Take 5mg  daily x 21 days then off x 7 days  21 capsule  0  . levothyroxine (SYNTHROID, LEVOTHROID) 88 MCG tablet Take 1 tablet (88 mcg total) by mouth daily.  90 tablet  1  . loratadine (CLARITIN) 10 MG tablet Take 1 tablet (10 mg total) by mouth daily.      [name] omeprazole (PRILOSEC) 40 MG capsule Take 1 capsule (40 mg total) by mouth daily.  30 capsule  5  . cyclobenzaprine (FLEXERIL) 10 MG tablet Take 1 tablet (10 mg total) by mouth 3 (three) times daily as needed for muscle spasms.  30 tablet  0  . Hydrocod Polst-Chlorphen Polst 10-8 MG CP12 Take 1 tablet by mouth 2 (two) times daily as needed.  25 each  0  . [DISCONTINUED] fexofenadine (ALLEGRA) 60 MG tablet Take 180 mg by mouth daily.         No current facility-administered medications for this visit.    OBJECTIVE: Middle-aged white male who appears comfortable and is in no acute distress Filed Vitals:   03/10/13 1411  BP: 107/68  Pulse: 101  Temp: 97.8 F (36.6 C)  Resp: 20     Body mass index is 26.72 kg/(m^2).    ECOG FS: 1 Filed Weights   03/10/13 1411  Weight: 181 lb (82.101 kg)   Sclerae unicteric Oropharynx clear, buccal mucosa is pink and moist No cervical or supraclavicular adenopathy Lungs clear to auscultation, no rales or rhonchi Heart regular rate and  rhythm Abd soft, nontender to palpation, positive bowel sounds MSK no focal spinal tenderness to vigorous palpation, no peripheral edema Neuro: nonfocal; well oriented; appropriate affect  LAB RESULTS:   Additional labs are pending today (03/10/2013),  including kappa/lambda light chains, proteins/creatinine ratio, and metabolic panel.   Lab Results  Component Value Date   WBC 4.0 03/10/2013   NEUTROABS 1.9 03/10/2013   HGB 12.0* 03/10/2013   HCT 35.1* 03/10/2013   MCV 93.0 03/10/2013   PLT 120* 03/10/2013      Chemistry      Component Value Date/Time   NA 141 03/10/2013 1352   NA 140 04/10/2012 1123   K 4.4 03/10/2013 1352   K 4.2 04/10/2012 1123   CL 107 03/10/2013 1352   CL 103 04/10/2012 1123   CO2 24 03/10/2013 1352   CO2 26 04/10/2012 1123   BUN 15.6 03/10/2013 1352   BUN 16 04/10/2012 1123   CREATININE 1.1 03/10/2013 1352   CREATININE 1.49* 04/10/2012 1123      Component Value Date/Time   CALCIUM 8.9 03/10/2013 1352   CALCIUM 9.9 04/10/2012 1123   ALKPHOS 72 03/10/2013 1352   ALKPHOS 38* 09/27/2012 1132   AST 31 03/10/2013 1352   AST 25 09/27/2012 1132   ALT 39 03/10/2013 1352   ALT 26 09/27/2012 1132   BILITOT 0.37 03/10/2013 1352   BILITOT 0.6 09/27/2012 1132         STUDIES:  Dg Lumbar Spine Complete  02/25/2013   *RADIOLOGY REPORT*  Clinical Data: Low back pain, history of sacral fracture 1 year ago  LUMBAR SPINE - COMPLETE 4+ VIEW  Comparison: MR pelvis of 05/13/2012  Findings: The lumbar vertebrae are in normal alignment.  No compression deformity is seen.  There is mild degenerative disc disease at L2-3 and L3-4 levels with slight loss of disc space and spurring.  The SI joints appear normal.  Some sclerosis is noted in the right  sacrum which may be due to healing from prior study of fracture.  IMPRESSION: Normal alignment with mild degenerative disc disease at L3-4 and L4- 5 levels.  No acute compression deformity.   Original Report Authenticated By: Dwyane Dee, M.D.      ASSESSMENT: 62 y.o.  [place] man with a history of multiple myeloma diagnosed February of 2012, with an initial M-spike of 2.6 g/dL, IFE showing an IgG lambda paraprotein and free lambda light chains in the urine. Cytogenetics showed trisomy 67. Bone marrow biopsy showed a 22% plasmacytosis. Treated with   (1) bortezomib (subcutaneously), lenalidomide, and dexamethasone, with repeat bone marrow biopsy May of 2012 showing 10% plasmacytosis   (2) high-dose chemotherapy with BCNU and melphalan at [facility], followed by stem cell rescue July of 2012   (3) on zoledronic acid started December of 2012, initially monthly, currently given every 3 months, most recent dose 01/28/2013  (4) low-dose lenalidomide resumed April 2013, interrupted several times since.  Resumed again on 02/19/2013, and a dose of 5 mg daily, 21 days on and 7 days off.  (5) CNS symptoms and abnormal brain MRI extensively evaluated by neurology with no definitive diagnosis established; improving    PLAN:  I have reviewed Andre [name]'s case with Dr. Darnelle Catalan, and Andre [name] seems to be tolerating the lenalidomide reasonably well clinically. He'll continue with the current regimen, with Revlimid given at 5 mg daily, 21 days on and 7 days off. He is scheduled to see Dr. Darnelle Catalan in 2 weeks as he begins cycle 2 of lenalidomide, then again in early August as he begins cycle 3. At that time, he will also be due for his next dose of zoledronic acid, given every 3 months with last dose on 01/28/2013.  Of course in the meanwhile, Andre [name] will let us know if he has any increased problems. He also knows to call with any questions prior to his next scheduled visit.   Graham Doukas    03/10/2013

## 2013-03-10 NOTE — Telephone Encounter (Signed)
appts made and printed. Pt is aware that zometa will follow after seeing GCM. i emailed MW to add the tx...td

## 2013-03-11 ENCOUNTER — Telehealth: Payer: Self-pay | Admitting: *Deleted

## 2013-03-11 NOTE — Telephone Encounter (Signed)
[facility] faxed Revlimid refill request.  Request to provider for review.

## 2013-03-12 LAB — SPEP & IFE WITH QIG
Albumin ELP: 61.8 % (ref 55.8–66.1)
Beta 2: 3.6 % (ref 3.2–6.5)
IgA: 121 mg/dL (ref 68–379)
IgM, Serum: 53 mg/dL (ref 41–251)
Total Protein, Serum Electrophoresis: 6.3 g/dL (ref 6.0–8.3)

## 2013-03-12 LAB — KAPPA/LAMBDA LIGHT CHAINS
Kappa free light chain: 3.47 mg/dL — ABNORMAL HIGH (ref 0.33–1.94)
Kappa:Lambda Ratio: 0.95 (ref 0.26–1.65)
Lambda Free Lght Chn: 3.67 mg/dL — ABNORMAL HIGH (ref 0.57–2.63)

## 2013-03-14 ENCOUNTER — Other Ambulatory Visit: Payer: Self-pay | Admitting: *Deleted

## 2013-03-14 MED ORDER — LENALIDOMIDE 5 MG PO CAPS: 5.0000 mg | ORAL_CAPSULE | Freq: Every day | ORAL | Status: DC

## 2013-03-17 ENCOUNTER — Other Ambulatory Visit: Payer: Self-pay | Admitting: Oncology

## 2013-03-17 ENCOUNTER — Telehealth: Payer: Self-pay | Admitting: *Deleted

## 2013-03-17 NOTE — Telephone Encounter (Signed)
Message left by pt stating he reviewed lab results per mychart and noted increase in M spike.  " I was wondering if Dr Thea Silversmith wanted to increase my dose of revlimid since I am tolerating it so well "  Return call number given as [phone].

## 2013-03-18 ENCOUNTER — Other Ambulatory Visit: Payer: Commercial Managed Care - PPO | Admitting: Lab

## 2013-03-18 ENCOUNTER — Telehealth: Payer: Self-pay | Admitting: Oncology

## 2013-03-18 ENCOUNTER — Other Ambulatory Visit: Payer: Self-pay | Admitting: *Deleted

## 2013-03-19 ENCOUNTER — Ambulatory Visit (HOSPITAL_BASED_OUTPATIENT_CLINIC_OR_DEPARTMENT_OTHER): Payer: Commercial Managed Care - PPO | Admitting: Oncology

## 2013-03-19 ENCOUNTER — Encounter: Payer: Commercial Managed Care - PPO | Admitting: *Deleted

## 2013-03-19 ENCOUNTER — Encounter: Payer: Self-pay | Admitting: *Deleted

## 2013-03-19 ENCOUNTER — Telehealth: Payer: Self-pay | Admitting: *Deleted

## 2013-03-19 ENCOUNTER — Telehealth: Payer: Self-pay | Admitting: Oncology

## 2013-03-19 VITALS — BP 121/75 | HR 69 | Temp 98.5°F | Resp 20 | Ht 69.0 in | Wt 180.4 lb

## 2013-03-19 DIAGNOSIS — C9000 Multiple myeloma not having achieved remission: Secondary | ICD-10-CM

## 2013-03-19 NOTE — Progress Notes (Signed)
ID: Andre [name]   DOB: 1951/08/14  MR#: [id number]  MVH#:[id number]   PCP: Andre [name] SU: OTHER MD: Andre [name], Andre [name], Andre [name]  HISTORY OF PRESENT ILLNESS: The patient was worked up for peptic ulcer disease in August of 2011, with significant bleeding and anemia.  The patient was Helicobacter pylori negative.  He received some epinephrine when he had his EGD and then started on Protonix.  [name] anemia slowly resolved so that by September, his hemoglobin was up to 10.9 and by earlier this month, his hemoglobin was up to 12.5.    As part of his general workup, he was found to have a slightly elevated globulin fraction.  In September, his total protein was 8.3 with an albumin of 3.8.  In January, the total protein was 8.4 and albumin 3.6.  With persistence of this slight abnormality, Dr. Juanda [name] obtained serum immunofixation and SPEP.  The SPTP showed an M-spike of 2.67 grams.  A total IgG was 4,190.  Total IgA low at 28.  Total IgM low at 28 also.  The immunofixation showed a monoclonal IgG lambda paraprotein.  There were also monoclonal free lambda light chains present. With this information, the patient was referred for further evaluation. A diagnosis of myeloma was confirmed by bone marrow biopsy and subsequebnt treatment is as detailed below  INTERVAL HISTORY: Andre [name] returns today for followup of his multiple myeloma accompanied by his daughter Andre [name] (his daughter Andre [name], who usually comes with him, is busy with her 46 day old baby). We scheduled this visit so we could discuss with Andre [name] the results of his most recent lab work, which showed disease progression.   REVIEW OF SYSTEMS: He is walking 2 miles a day, beginning to do a little bit of running, but he isn't swimming because he continues to have a little bit of discomfort in his left shoulder. He has mild discomfort in his lower back, which developed after he helped his daughter moves some  heavy boxes. A film of that area showed only mild arthritis. He is tolerating the left eyelid it with mild constipation and mild to moderate fatigue as the main side effects, but he continues to work full-time. He is able to walk up 3 flights of stairs at work without stopping. There have been no unusual headaches, visual changes, nausea, vomiting, taste alteration, or change in bladder habits. There has been no fever, rash, or bleeding. A detailed review of systems was otherwise noncontributory  PAST MEDICAL HISTORY: Past Medical History  Diagnosis Date  . Hypertension   . Hyperlipidemia   . Duodenal ulcer   . Multiple myeloma 07/04/2011  . Allergy   Significant for peptic ulcer disease as noted above.  History of hyperlipidemia.  History of anxiety and depression.  History of GERD which is significantly improved with weight loss and history of reactive airway disease, possibly secondary to the GERD which also improved with weight loss.  There was a history of obesity, now much improved secondary to exercise and diet.   PAST SURGICAL HISTORY: Past Surgical History  Procedure Laterality Date  . Cardiolite study  11/25/2003    NORMAL  . Bone marrow transplant  2011    for MM    FAMILY HISTORY Family History  Problem Relation Age of Onset  . Cancer Mother   . Hypertension Father   . Stroke Father   . Asthma Father   . Diabetes Father   [name] father died at the age  of 84 following a stroke.  [name] mother died with cancer of the throat at the age of 21.   She had a history of depression and was a smoker.  Not clear how much alcohol she drank, according to the patient.  The patient had two brothers.  One died from the age of 6 from a "kidney problem."  The second one died at the age of 46 from pancreatic cancer. (This may have been duodenal cancer. 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  His wife of 37 years is Andre [name].  Unfortunately, they are in the process of divorce. The patient has two daughters:  Andre [name] who is 6, and Andre [name] who is 54.  They both live here in [place]. 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  The patient is very close to his daughters.  Andre [name] has one daughter, Andre [name], born 03/06/2013.  The patient is not a church attender.  He does derive a great deal of support from friends at the "[employer]" which he attends very regularly, and also participates in Al-Anon even though actually there is no alcohol or drug problem in the family.  He participates in this group and he gets quite a bit of support from it.      ADVANCED DIRECTIVES: In place  HEALTH MAINTENANCE: History  Substance Use Topics  . Smoking status: Former Smoker -- 3 years    Quit date: 03/27/1969  . Smokeless tobacco: Never Used  . Alcohol Use: No     Colonoscopy:  PSA:  Lipid panel:  Allergies  Allergen Reactions  . Crestor (Rosuvastatin Calcium)     ADVERSE EFFECTS ON LIVER  . Lipitor (Atorvastatin Calcium)     Current Outpatient Prescriptions  Medication Sig Dispense Refill  . aspirin 81 MG tablet Take 1 tablet (81 mg total) by mouth daily.  30 tablet    . cholecalciferol (VITAMIN D) 1000 UNITS tablet Take 1 tablet (1,000 Units total) by mouth daily.  100 tablet  3  . cyclobenzaprine (FLEXERIL) 10 MG tablet Take 1 tablet (10 mg total) by mouth 3 (three) times daily as needed for muscle spasms.  30 tablet  0  . diphenhydramine-acetaminophen (TYLENOL PM) 25-500 MG TABS Take 0.5 tablets by mouth at bedtime as needed.      [name] escitalopram (LEXAPRO) 10 MG tablet Take 1 tablet (10 mg total) by mouth daily.  30 tablet  5  . ezetimibe (ZETIA) 10 MG tablet Take 1 tablet (10 mg total) by mouth daily.  90 tablet  3  . Hydrocod Polst-Chlorphen Polst 10-8 MG CP12 Take 1 tablet by mouth 2 (two) times daily as  needed.  25 each  0  . ibuprofen (ADVIL,MOTRIN) 200 MG tablet Take 600 mg by mouth every 6 (six) hours as needed for pain.      [name] ibuprofen (ADVIL,MOTRIN) 800 MG tablet Take 1 tablet (800 mg total) by mouth 3 (three) times daily.  30 tablet  0  . lenalidomide (REVLIMID) 5 MG capsule Take 1 capsule (5 mg total) by mouth daily. Take 5mg  daily x 21 days then off x 7 days  21 capsule  0  . levothyroxine (SYNTHROID, LEVOTHROID) 88 MCG tablet Take 1 tablet (88 mcg total) by mouth daily.  90 tablet  1  . loratadine (CLARITIN) 10 MG  tablet Take 1 tablet (10 mg total) by mouth daily.      [name] omeprazole (PRILOSEC) 40 MG capsule Take 1 capsule (40 mg total) by mouth daily.  30 capsule  5  . [DISCONTINUED] fexofenadine (ALLEGRA) 60 MG tablet Take 180 mg by mouth daily.         No current facility-administered medications for this visit.    OBJECTIVE: Middle-aged white male in no acute distress Filed Vitals:   03/19/13 0910  BP: 121/75  Pulse: 69  Temp: 98.5 F (36.9 C)  Resp: 20     Body mass index is 26.63 kg/(m^2).    ECOG FS: 1 Filed Weights   03/19/13 0910  Weight: 180 lb 6.4 oz (81.829 kg)   Sclerae unicteric Oropharynx clear No cervical or supraclavicular adenopathy Lungs clear to auscultation, no rales or rhonchi Heart regular rate and rhythm, no S3 noted Abd soft, nontender, positive bowel sounds MSK no focal spinal tenderness to vigorous palpation including the lumbosacral area, no peripheral edema Neuro: nonfocal; well oriented; appropriate affect  LAB RESULTS: Results for GIBRAN, VESELKA (MRN [id number]) as of 03/19/2013 09:10  Ref. Range 08/12/2012 10:18 10/01/2012 08:40 11/05/2012 08:06 01/27/2013 00:00 03/10/2013 13:52  M-SPIKE, % No range found NOT DET NOT DET NOT DET 0.19 0.27   Results for [facility] (MRN [id number]) as of 03/19/2013 09:10  Ref. Range 10/01/2012 08:40 11/05/2012 08:06 01/27/2013 00:00 01/27/2013 13:22 03/10/2013 13:52  Kappa free light chain Latest Range:  0.33-1.94 mg/dL 7.82 9.56  2.13 0.86 (H)  Lambda Free Lght Chn Latest Range: 0.57-2.63 mg/dL 5.78 4.69  6.29 5.28 (H)  Kappa:Lambda Ratio Latest Range: 0.26-1.65  0.70 0.93  0.84 0.95     Lab Results  Component Value Date   WBC 4.0 03/10/2013   NEUTROABS 1.9 03/10/2013   HGB 12.0* 03/10/2013   HCT 35.1* 03/10/2013   MCV 93.0 03/10/2013   PLT 120* 03/10/2013      Chemistry      Component Value Date/Time   NA 141 03/10/2013 1352   NA 140 04/10/2012 1123   K 4.4 03/10/2013 1352   K 4.2 04/10/2012 1123   CL 107 03/10/2013 1352   CL 103 04/10/2012 1123   CO2 24 03/10/2013 1352   CO2 26 04/10/2012 1123   BUN 15.6 03/10/2013 1352   BUN 16 04/10/2012 1123   CREATININE 1.1 03/10/2013 1352   CREATININE 1.49* 04/10/2012 1123      Component Value Date/Time   CALCIUM 8.9 03/10/2013 1352   CALCIUM 9.9 04/10/2012 1123   ALKPHOS 72 03/10/2013 1352   ALKPHOS 38* 09/27/2012 1132   AST 31 03/10/2013 1352   AST 25 09/27/2012 1132   ALT 39 03/10/2013 1352   ALT 26 09/27/2012 1132   BILITOT 0.37 03/10/2013 1352   BILITOT 0.6 09/27/2012 1132      STUDIES:  Dg Lumbar Spine Complete  02/25/2013   *RADIOLOGY REPORT*  Clinical Data: Low back pain, history of sacral fracture 1 year ago  LUMBAR SPINE - COMPLETE 4+ VIEW  Comparison: MR pelvis of 05/13/2012  Findings: The lumbar vertebrae are in normal alignment.  No compression deformity is seen.  There is mild degenerative disc disease at L2-3 and L3-4 levels with slight loss of disc space and spurring.  The SI joints appear normal.  Some sclerosis is noted in the right sacrum which may be due to healing from prior study of fracture.  IMPRESSION: Normal alignment with mild degenerative disc disease at L3-4 and  L4- 5 levels.  No acute compression deformity.   Original Report Authenticated By: Dwyane Dee, M.D.     ASSESSMENT: 62 y.o.  [place] man with a history of multiple myeloma diagnosed February of 2012, with an initial M-spike of 2.6 g/dL, IFE showing an IgG  lambda paraprotein and free lambda light chains in the urine. Cytogenetics showed trisomy 71. Bone marrow biopsy showed a 22% plasmacytosis. Treated with   (1) bortezomib (subcutaneously), lenalidomide, and dexamethasone, with repeat bone marrow biopsy May of 2012 showing 10% plasmacytosis   (2) high-dose chemotherapy with BCNU and melphalan at [facility], followed by stem cell rescue July of 2012   (3) on zoledronic acid started December of 2012, initially monthly, currently given every 3 months, most recent dose 01/28/2013  (4) low-dose lenalidomide resumed April 2013, interrupted several times since.  Resumed again on 02/19/2013, and a dose of 5 mg daily, 21 days on and 7 days off.  (5) CNS symptoms and abnormal brain MRI extensively evaluated by neurology with no definitive diagnosis established; improving   (6) rising M spike noted June 2014   PLAN:   we spent the better part of today's 45 minute meeting reviewing [name]'s situation in detail. It appears [name]'s myeloma is again active. The fact that the M spike continued to rise despite his being on 5 mg of lenalidomide daily tells [place] that that treatment, at least at that dose, is not going to control the disease.  I think we need to be a bit more aggressive so as not to let the myeloma get ahead of [place]. Accordingly I would treat him with carfilzomib and dexamethasone, preferably through our SWOG S1304 study. I discussed the format of this study and the overall treatment plan with Andre [name]. He is very interested in participating.   The only fly in the ointment is an echocardiogram he had July of 2013 which showed a borderline to low ejection fraction. Given his excellent exercise tolerance I suspect this has resolved, but in any case it will need to be repeated. Andre [name] is very interested in participating. I will arrange for a meeting with the research nurse to get the process started.   MAGRINAT,GUSTAV C    03/19/2013

## 2013-03-19 NOTE — Telephone Encounter (Signed)
Per staff message and POF I have scheduled appts.  JMW  

## 2013-03-20 ENCOUNTER — Telehealth: Payer: Self-pay | Admitting: Oncology

## 2013-03-20 ENCOUNTER — Other Ambulatory Visit (HOSPITAL_COMMUNITY): Payer: Commercial Managed Care - PPO

## 2013-03-20 NOTE — Telephone Encounter (Signed)
, °

## 2013-03-24 ENCOUNTER — Other Ambulatory Visit: Payer: Self-pay | Admitting: *Deleted

## 2013-03-24 ENCOUNTER — Ambulatory Visit (HOSPITAL_COMMUNITY)
Admission: RE | Admit: 2013-03-24 | Discharge: 2013-03-24 | Disposition: A | Discharge status: Home / Self Care | Payer: Commercial Managed Care - PPO | Source: Ambulatory Visit | Attending: Oncology | Admitting: Oncology

## 2013-03-24 ENCOUNTER — Telehealth: Payer: Self-pay | Admitting: *Deleted

## 2013-03-24 DIAGNOSIS — Z0000 Encounter for general adult medical examination without abnormal findings: Secondary | ICD-10-CM

## 2013-03-24 DIAGNOSIS — Z01818 Encounter for other preprocedural examination: Secondary | ICD-10-CM | POA: Insufficient documentation

## 2013-03-24 DIAGNOSIS — G4700 Insomnia, unspecified: Secondary | ICD-10-CM

## 2013-03-24 DIAGNOSIS — C9000 Multiple myeloma not having achieved remission: Secondary | ICD-10-CM

## 2013-03-24 DIAGNOSIS — F329 Major depressive disorder, single episode, unspecified: Secondary | ICD-10-CM

## 2013-03-24 DIAGNOSIS — I519 Heart disease, unspecified: Secondary | ICD-10-CM | POA: Insufficient documentation

## 2013-03-24 DIAGNOSIS — R5383 Other fatigue: Secondary | ICD-10-CM

## 2013-03-24 DIAGNOSIS — I517 Cardiomegaly: Secondary | ICD-10-CM

## 2013-03-24 MED ORDER — ESCITALOPRAM OXALATE 10 MG PO TABS: 10.0000 mg | ORAL_TABLET | Freq: Every day | ORAL | Status: DC

## 2013-03-24 NOTE — Telephone Encounter (Signed)
sw pt and tried to make him aware of his appts for 03/25/13. Pt was already aware due to mychart. i made him aware that MW will move his other appts. i emailed MW to move the tx...td

## 2013-03-24 NOTE — Progress Notes (Signed)
Echocardiogram 2D Echocardiogram has been performed.  Andre [name] 03/24/2013, 11:39 AM

## 2013-03-25 ENCOUNTER — Ambulatory Visit: Payer: Commercial Managed Care - PPO | Admitting: Oncology

## 2013-03-25 ENCOUNTER — Other Ambulatory Visit (HOSPITAL_BASED_OUTPATIENT_CLINIC_OR_DEPARTMENT_OTHER): Payer: Commercial Managed Care - PPO | Admitting: Lab

## 2013-03-25 ENCOUNTER — Ambulatory Visit: Payer: Commercial Managed Care - PPO

## 2013-03-25 ENCOUNTER — Other Ambulatory Visit: Payer: Self-pay

## 2013-03-25 ENCOUNTER — Telehealth: Payer: Self-pay | Admitting: *Deleted

## 2013-03-25 DIAGNOSIS — C9000 Multiple myeloma not having achieved remission: Secondary | ICD-10-CM

## 2013-03-25 LAB — CBC WITH DIFFERENTIAL/PLATELET
Basophils Absolute: 0.2 10*3/uL — ABNORMAL HIGH (ref 0.0–0.1)
EOS%: 9.5 % — ABNORMAL HIGH (ref 0.0–7.0)
Eosinophils Absolute: 0.4 10*3/uL (ref 0.0–0.5)
HGB: 12.3 g/dL — ABNORMAL LOW (ref 13.0–17.1)
MCV: 91.9 fL (ref 79.3–98.0)
MONO%: 11.6 % (ref 0.0–14.0)
NEUT#: 1.8 10*3/uL (ref 1.5–6.5)
RBC: 3.99 10*6/uL — ABNORMAL LOW (ref 4.20–5.82)
RDW: 15 % — ABNORMAL HIGH (ref 11.0–14.6)
lymph#: 1.4 10*3/uL (ref 0.9–3.3)

## 2013-03-25 LAB — COMPREHENSIVE METABOLIC PANEL (CC13)
Albumin: 3.6 g/dL (ref 3.5–5.0)
Alkaline Phosphatase: 66 U/L (ref 40–150)
BUN: 19 mg/dL (ref 7.0–26.0)
CO2: 25 mEq/L (ref 22–29)
Calcium: 9.3 mg/dL (ref 8.4–10.4)
Chloride: 106 mEq/L (ref 98–109)
Glucose: 112 mg/dl (ref 70–140)
Potassium: 3.7 mEq/L (ref 3.5–5.1)
Sodium: 138 mEq/L (ref 136–145)
Total Protein: 6.7 g/dL (ref 6.4–8.3)

## 2013-03-25 NOTE — Telephone Encounter (Signed)
Per staff message and POF I have scheduled appts.  JMW  

## 2013-03-26 ENCOUNTER — Other Ambulatory Visit: Payer: Self-pay | Admitting: *Deleted

## 2013-03-26 ENCOUNTER — Telehealth: Payer: Self-pay | Admitting: *Deleted

## 2013-03-26 DIAGNOSIS — C9000 Multiple myeloma not having achieved remission: Secondary | ICD-10-CM

## 2013-03-26 NOTE — Telephone Encounter (Signed)
Lm informed the pt that his appt w/ [facility] on 04/03/13 @ 4:30 pm is now cancel. Also made him aware that his appt times for 04/08/13 has changed. gv appt for 7/22/14labs@ 10:30am, ov @ 11am, and tx to follow. i emailed MW to adjust tx time. Pt is aware that i will mail a letter/avs as well...td

## 2013-03-27 ENCOUNTER — Encounter (HOSPITAL_COMMUNITY): Payer: Self-pay | Admitting: Pharmacy Technician

## 2013-03-27 ENCOUNTER — Other Ambulatory Visit: Payer: Self-pay | Admitting: Radiology

## 2013-03-29 LAB — SPEP & IFE WITH QIG
Gamma Globulin: 13.7 % (ref 11.1–18.8)
IgA: 117 mg/dL (ref 68–379)
IgG (Immunoglobin G), Serum: 974 mg/dL (ref 650–1600)
IgM, Serum: 43 mg/dL (ref 41–251)
M-Spike, %: 0.32 g/dL

## 2013-03-29 LAB — IGE: IgE (Immunoglobulin E), Serum: 2349.2 IU/mL — ABNORMAL HIGH (ref 0.0–180.0)

## 2013-03-29 LAB — BETA 2 MICROGLOBULIN, SERUM: Beta-2 Microglobulin: 1.91 mg/L — ABNORMAL HIGH (ref 1.01–1.73)

## 2013-03-31 ENCOUNTER — Encounter (HOSPITAL_COMMUNITY): Payer: Self-pay

## 2013-03-31 ENCOUNTER — Ambulatory Visit (HOSPITAL_COMMUNITY)
Admission: RE | Admit: 2013-03-31 | Discharge: 2013-03-31 | Disposition: A | Discharge status: Home / Self Care | Payer: Commercial Managed Care - PPO | Source: Ambulatory Visit | Attending: Oncology | Admitting: Oncology

## 2013-03-31 ENCOUNTER — Encounter: Payer: Self-pay | Admitting: *Deleted

## 2013-03-31 DIAGNOSIS — D649 Anemia, unspecified: Secondary | ICD-10-CM | POA: Insufficient documentation

## 2013-03-31 DIAGNOSIS — E785 Hyperlipidemia, unspecified: Secondary | ICD-10-CM | POA: Insufficient documentation

## 2013-03-31 DIAGNOSIS — C9000 Multiple myeloma not having achieved remission: Secondary | ICD-10-CM

## 2013-03-31 DIAGNOSIS — Z79899 Other long term (current) drug therapy: Secondary | ICD-10-CM | POA: Insufficient documentation

## 2013-03-31 DIAGNOSIS — C9002 Multiple myeloma in relapse: Secondary | ICD-10-CM | POA: Insufficient documentation

## 2013-03-31 DIAGNOSIS — I10 Essential (primary) hypertension: Secondary | ICD-10-CM | POA: Insufficient documentation

## 2013-03-31 LAB — BONE MARROW EXAM

## 2013-03-31 LAB — CBC
HCT: 37.3 % — ABNORMAL LOW (ref 39.0–52.0)
Hemoglobin: 12.7 g/dL — ABNORMAL LOW (ref 13.0–17.0)
MCHC: 34 g/dL (ref 30.0–36.0)
MCV: 89 fL (ref 78.0–100.0)

## 2013-03-31 LAB — PROTIME-INR: INR: 0.91 (ref 0.00–1.49)

## 2013-03-31 MED ORDER — SODIUM CHLORIDE 0.9 % IV SOLN
INTRAVENOUS | Status: DC
  Administered 2013-03-31: 20 mL/h via INTRAVENOUS

## 2013-03-31 MED ORDER — FENTANYL CITRATE 0.05 MG/ML IJ SOLN
INTRAMUSCULAR | Status: AC
  Filled 2013-03-31: qty 6

## 2013-03-31 MED ORDER — FENTANYL CITRATE 0.05 MG/ML IJ SOLN
INTRAMUSCULAR | Status: AC | PRN
  Administered 2013-03-31: 100 ug via INTRAVENOUS

## 2013-03-31 MED ORDER — HYDROCODONE-ACETAMINOPHEN 5-325 MG PO TABS
1.0000 | ORAL_TABLET | ORAL | Status: DC | PRN
  Filled 2013-03-31: qty 2

## 2013-03-31 MED ORDER — MIDAZOLAM HCL 2 MG/2ML IJ SOLN
INTRAMUSCULAR | Status: AC
  Filled 2013-03-31: qty 6

## 2013-03-31 MED ORDER — MIDAZOLAM HCL 2 MG/2ML IJ SOLN
INTRAMUSCULAR | Status: AC | PRN
  Administered 2013-03-31 (×2): 1 mg via INTRAVENOUS

## 2013-03-31 NOTE — Progress Notes (Signed)
03/31/2013 Patient in to clinic this morning for bone marrow procedure in interventional radiology. Spoke with Lynden Ang in Interventional Radiology [phone]) who will notify Dr. Bonnielee Haff (pager [phone]) that additional samples are required for the research study, to be collected during today's procedure, in addition to standard procedures collected for institutional assessment by [facility] pathology. Retrieved 24 hour urine sample from patient in Short Stay center this morning after notifying patient's nurse, Darel Hong, and transported sample to [facility] laboratory for testing (UPEP, IFE and total protein). Cindy S. Clelia Croft BSN, RN, CCRP 03/31/2013 9:27 AM

## 2013-03-31 NOTE — Procedures (Signed)
BM aspirate and Core R ilac bone No comp

## 2013-03-31 NOTE — H&P (Signed)
Andre [name] is an 62 y.o. male.   Chief Complaint: "I'm having another bone marrow biopsy" HPI: Patient with history of recurrent multiple myeloma presents today for CT guided bone marrow biopsy.  Past Medical History  Diagnosis Date  . Hypertension   . Hyperlipidemia   . Duodenal ulcer   . Multiple myeloma 07/04/2011  . Allergy     Past Surgical History  Procedure Laterality Date  . Cardiolite study  11/25/2003    NORMAL  . Bone marrow transplant  2011    for MM    Family History  Problem Relation Age of Onset  . Cancer Mother   . Hypertension Father   . Stroke Father   . Asthma Father   . Diabetes Father    Social History:  reports that he quit smoking about 44 years ago. He has never used smokeless tobacco. He reports that he does not drink alcohol or use illicit drugs.  Allergies:  Allergies  Allergen Reactions  . Crestor (Rosuvastatin Calcium)     ADVERSE EFFECTS ON LIVER  . Lipitor (Atorvastatin Calcium)     Current outpatient prescriptions:aspirin EC 81 MG tablet, Take 81 mg by mouth every morning., Disp: , Rfl: ;  cholecalciferol (VITAMIN D) 1000 UNITS tablet, Take 1,000 Units by mouth every morning., Disp: , Rfl: ;  diphenhydramine-acetaminophen (TYLENOL PM) 25-500 MG TABS, Take 1 tablet by mouth at bedtime. , Disp: , Rfl: ;  escitalopram (LEXAPRO) 10 MG tablet, Take 10 mg by mouth at bedtime., Disp: , Rfl:  ezetimibe (ZETIA) 10 MG tablet, Take 10 mg by mouth every morning., Disp: , Rfl: ;  ibuprofen (ADVIL,MOTRIN) 200 MG tablet, Take 400 mg by mouth every 8 (eight) hours as needed for pain. , Disp: , Rfl: ;  levothyroxine (SYNTHROID, LEVOTHROID) 88 MCG tablet, Take 88 mcg by mouth daily before breakfast., Disp: , Rfl: ;  loratadine (CLARITIN) 10 MG tablet, Take 10 mg by mouth every morning., Disp: , Rfl:  omeprazole (PRILOSEC) 40 MG capsule, Take 40 mg by mouth every morning., Disp: , Rfl: ;  [DISCONTINUED] fexofenadine (ALLEGRA) 60 MG tablet, Take 180 mg by  mouth daily.  , Disp: , Rfl:  Current facility-administered medications:0.9 %  sodium chloride infusion, , Intravenous, Continuous, Brayton El, PA-C, Last Rate: 20 mL/hr at 03/31/13 0717, 20 mL/hr at 03/31/13 0717   Results for orders placed during the hospital encounter of 03/31/13 (from the past 48 hour(s))  CBC     Status: Abnormal   Collection Time    03/31/13  7:10 AM      Result Value Range   WBC 4.7  4.0 - 10.5 K/uL   RBC 4.19 (*) 4.22 - 5.81 MIL/uL   Hemoglobin 12.7 (*) 13.0 - 17.0 g/dL   HCT 21.3 (*) 08.6 - 57.8 %   MCV 89.0  78.0 - 100.0 fL   MCH 30.3  26.0 - 34.0 pg   MCHC 34.0  30.0 - 36.0 g/dL   RDW 46.9  62.9 - 52.8 %   Platelets 164  150 - 400 K/uL  PROTIME-INR     Status: None   Collection Time    03/31/13  7:10 AM      Result Value Range   Prothrombin Time 12.1  11.6 - 15.2 seconds   INR 0.91  0.00 - 1.49   No results found.  Review of Systems  Constitutional: Negative for fever and chills.  Respiratory: Negative for cough and shortness of breath.   Cardiovascular:  Negative for chest pain.  Gastrointestinal: Negative for nausea, vomiting and abdominal pain.  Musculoskeletal: Negative for back pain.  Neurological: Negative for headaches.  Endo/Heme/Allergies: Does not bruise/bleed easily.    Blood pressure 137/78, pulse 53, temperature 97.4 F (36.3 C), temperature source Oral, resp. rate 20, height 5\' 9"  (1.753 m), weight 180 lb (81.647 kg), SpO2 97.00%. Physical Exam  Constitutional: He is oriented to person, place, and time. He appears well-developed and well-nourished.  Cardiovascular:  Sl bradycardic but reg rhythm  Respiratory: Effort normal and breath sounds normal.  GI: Soft. Bowel sounds are normal.  Musculoskeletal: Normal range of motion. He exhibits no edema.  Neurological: He is alert and oriented to person, place, and time.     Assessment/Plan: Pt with recurrent multiple myeloma. Plan is for CT guided bone marrow biopsy today.  Details/risks of procedure d/w pt with his understanding and consent.  ALLRED,D KEVIN 03/31/2013, 8:10 AM

## 2013-04-01 ENCOUNTER — Encounter (HOSPITAL_COMMUNITY)
Admission: RE | Admit: 2013-04-01 | Discharge: 2013-04-01 | Disposition: A | Discharge status: Home / Self Care | Payer: Commercial Managed Care - PPO | Source: Ambulatory Visit | Attending: Oncology | Admitting: Oncology

## 2013-04-01 ENCOUNTER — Ambulatory Visit (HOSPITAL_COMMUNITY)
Admission: RE | Admit: 2013-04-01 | Discharge: 2013-04-01 | Disposition: A | Discharge status: Home / Self Care | Payer: Commercial Managed Care - PPO | Source: Ambulatory Visit | Attending: Oncology | Admitting: Oncology

## 2013-04-01 DIAGNOSIS — C9000 Multiple myeloma not having achieved remission: Secondary | ICD-10-CM

## 2013-04-01 DIAGNOSIS — M899 Disorder of bone, unspecified: Secondary | ICD-10-CM | POA: Insufficient documentation

## 2013-04-01 LAB — GLUCOSE, CAPILLARY: Glucose-Capillary: 79 mg/dL (ref 70–99)

## 2013-04-01 MED ORDER — FLUDEOXYGLUCOSE F - 18 (FDG) INJECTION
16.9000 | Freq: Once | INTRAVENOUS | Status: AC | PRN
  Administered 2013-04-01: 16.9 via INTRAVENOUS

## 2013-04-03 ENCOUNTER — Ambulatory Visit: Payer: Commercial Managed Care - PPO | Admitting: Oncology

## 2013-04-07 ENCOUNTER — Other Ambulatory Visit: Payer: Commercial Managed Care - PPO | Admitting: Lab

## 2013-04-07 ENCOUNTER — Other Ambulatory Visit: Payer: Self-pay | Admitting: *Deleted

## 2013-04-07 ENCOUNTER — Telehealth: Payer: Self-pay | Admitting: *Deleted

## 2013-04-07 ENCOUNTER — Ambulatory Visit: Payer: Commercial Managed Care - PPO

## 2013-04-07 DIAGNOSIS — C9000 Multiple myeloma not having achieved remission: Secondary | ICD-10-CM

## 2013-04-07 LAB — UIFE/LIGHT CHAINS/TP QN, 24-HR UR: Time: 24 hours

## 2013-04-07 LAB — UPEP/TP, 24-HR URINE

## 2013-04-07 NOTE — Telephone Encounter (Signed)
04/07/2013 Spoke with patient by phone today to inform him that we were notified today that [facility] laboratory lost his 24-hour urine sample that was collected and received by the [facility] laboratory on Monday, July 14th. Accordingly, the patient cannot be enrolled in the [facility] S1304 study without this sample. In addition, other lab tests will be out of date as of tomorrow (original anticipated randomization and treatment date).  Per discussion with Dr. Darnelle Catalan and coordination with patient, appointments previously scheduled for this week and next week will be cancelled. The patient will be seen on Friday, July 25th for [facility] and physical by mid-level provider. Patient will collect the repeat 24-hour urine sample on Sunday, July 27th and return the sample to the [facility] lab on Monday morning 04/14/2013. Results anticipated to be received in order to determine eligibility and enroll patient for anticipated treatment start of Tuesday, August 5th.  Per Teacher, English as a foreign language discussion with [facility] lab representation, there will be no charges to the patient for the repeat lab samples to be obtained; patient notified of above.  Cindy S. Clelia Croft BSN, RN, CCRP 04/07/2013 1:34 PM

## 2013-04-08 ENCOUNTER — Ambulatory Visit: Payer: Commercial Managed Care - PPO

## 2013-04-08 ENCOUNTER — Telehealth: Payer: Self-pay | Admitting: *Deleted

## 2013-04-08 ENCOUNTER — Other Ambulatory Visit: Payer: Commercial Managed Care - PPO

## 2013-04-08 ENCOUNTER — Other Ambulatory Visit: Payer: Commercial Managed Care - PPO | Admitting: Lab

## 2013-04-08 ENCOUNTER — Ambulatory Visit: Payer: Commercial Managed Care - PPO | Admitting: Family

## 2013-04-08 NOTE — Telephone Encounter (Signed)
sw pt informed him that his appts were cancel for 7/22, 7/23, 7/29, and 7/30. gv appt d/t for 04/11/13 for labs @ 9:30am and ov @ 10am. Pt is aware...td

## 2013-04-09 ENCOUNTER — Ambulatory Visit: Payer: Commercial Managed Care - PPO

## 2013-04-11 ENCOUNTER — Encounter: Payer: Commercial Managed Care - PPO | Admitting: *Deleted

## 2013-04-11 ENCOUNTER — Other Ambulatory Visit: Payer: Commercial Managed Care - PPO | Admitting: Lab

## 2013-04-11 ENCOUNTER — Telehealth: Payer: Self-pay | Admitting: Oncology

## 2013-04-11 ENCOUNTER — Encounter: Payer: Self-pay | Admitting: Family

## 2013-04-11 ENCOUNTER — Ambulatory Visit (HOSPITAL_BASED_OUTPATIENT_CLINIC_OR_DEPARTMENT_OTHER): Payer: Commercial Managed Care - PPO | Admitting: Family

## 2013-04-11 VITALS — BP 125/76 | HR 73 | Temp 98.5°F | Resp 20 | Ht 69.0 in | Wt 183.7 lb

## 2013-04-11 DIAGNOSIS — C9000 Multiple myeloma not having achieved remission: Secondary | ICD-10-CM

## 2013-04-11 LAB — CBC WITH DIFFERENTIAL/PLATELET
BASO%: 0.8 % (ref 0.0–2.0)
EOS%: 11.8 % — ABNORMAL HIGH (ref 0.0–7.0)
HGB: 12 g/dL — ABNORMAL LOW (ref 13.0–17.1)
MCH: 31.3 pg (ref 27.2–33.4)
MCHC: 34.2 g/dL (ref 32.0–36.0)
MCV: 91.6 fL (ref 79.3–98.0)
MONO%: 12 % (ref 0.0–14.0)
RBC: 3.85 10*6/uL — ABNORMAL LOW (ref 4.20–5.82)
RDW: 15.3 % — ABNORMAL HIGH (ref 11.0–14.6)
lymph#: 1 10*3/uL (ref 0.9–3.3)

## 2013-04-11 LAB — COMPREHENSIVE METABOLIC PANEL (CC13)
ALT: 19 U/L (ref 0–55)
AST: 23 U/L (ref 5–34)
Albumin: 3.5 g/dL (ref 3.5–5.0)
Alkaline Phosphatase: 63 U/L (ref 40–150)
Calcium: 9 mg/dL (ref 8.4–10.4)
Chloride: 109 mEq/L (ref 98–109)
Potassium: 4.2 mEq/L (ref 3.5–5.1)
Sodium: 140 mEq/L (ref 136–145)

## 2013-04-11 NOTE — Patient Instructions (Addendum)
Please contact us at [phone] if you have any questions or concerns.  Please continue to do well and enjoy life!!!  Get plenty of rest, drink plenty of water, exercise daily (walking), eat a balanced diet.  Take measures for constipation including increased hydration, Activia yogurt, probiotics, increased fiber intake, stool softeners, MiraLax if necessary.  Flush left eye twice daily with saline, take 1 Benadryl capsule daily x 3 days (in addition todaily Loratadine) for irritated eye.  If not clearing over the weekend or getting worse (i.e. Swelling, discharge) - go to ER or Urgent Care.

## 2013-04-11 NOTE — Telephone Encounter (Signed)
gv pt appt schedule for July and August. Pt needs f/u 8/19 prior to tx w/either GM or AB. No availability. Message to GM/AB and Judithann Graves - pt aware.

## 2013-04-11 NOTE — Progress Notes (Signed)
[facility]  Telephone:([phone] [phone]  OFFICE PROGRESS NOTE    ID: MUKUND WEINREB   DOB: 10-31-1950  MR#: [id number]  JYN#:[id number]   PCP: Sonda Primes, MD NEURO: Marlan Palau , MD CARDIO: Clovis Pu. Patty Sermons, MD NEURO: Elinor Dodge, MD [facility]) HEME/ONC:  Eddie Candle, MD [facility])   HISTORY OF PRESENT ILLNESS: The patient was worked up for peptic ulcer disease in 04/2010, with significant bleeding and anemia.  The patient was Helicobacter pylori negative.  He received some epinephrine when he had his EGD and then started on Protonix.  [name] anemia slowly resolved so that by 05/2010, his hemoglobin was up to 10.9.  Currently his hemoglobin has been in the mid-to-low 12 g/dL range.    As part of his general workup, he was found to have a slightly elevated globulin fraction.  In 05/2010, his total protein was 8.3 with an albumin of 3.8.  In 09/2010, the total protein was 8.4 and albumin 3.6.  With persistence of this slight abnormality, Dr. Juanda Chance obtained serum immunofixation and SPEP in 09/2010.  The SPEP showed an M-spike of 2.67 grams.  A total IgG was 4,190.  Total IgA low at 28.  Total IgM low at 28 also.  The immunofixation showed a monoclonal IgG lambda paraprotein.  There were also monoclonal free lambda light chains present. With this information, the patient was referred for further evaluation. A diagnosis of myeloma was confirmed by bone marrow biopsy and his subsequent treatment is as detailed below.  INTERVAL HISTORY: Mr. Juhasz returns today for followup of his multiple myeloma.  He was visited by research nurse Judithann Graves, RN for screening laboratory tests to determine eligibility for [phone] protocol study.   We discussed the results of his most recent PET scan and bone scan in 03/2013.  His interval history is significant for completing a 2-D echo on 03/24/2013 which  showed an estimated left ventricular ejection fraction between 50% to 55%.  Mr. Watkinson also completed a bone marrow biopsy on 03/31/2013.    REVIEW OF SYSTEMS: He continues to walk 2 miles a day.  He states his back pain and shoulder pain have been better do to increased physical stretching activities.  He denies any neuropathy, but does state he has a sharp pain in the top of both of his feet that is intermittent in nature.  He continues to experience fatigue.  Mr. Keenum also states that he has ongoing constipation.  He continues to work full-time. He is able to walk up 3 flights of stairs at work without stopping. There have been no unusual headaches, visual changes, nausea, vomiting, taste alteration, or change in bladder habits. There has been no fever, rash, or bleeding. A detailed review of systems was otherwise noncontributory   PAST MEDICAL HISTORY: Past Medical History  Diagnosis Date  . Hypertension   . Hyperlipidemia   . Duodenal ulcer   . Multiple myeloma 07/04/2011  . Allergy   . Thyroid disease   Significant for peptic ulcer disease as noted above.  History of hyperlipidemia.  History of anxiety and depression.  History of GERD which is significantly improved with weight loss and history of reactive airway disease, possibly secondary to the GERD which also improved with weight loss.  There was a history of obesity, now much improved secondary to exercise and diet.    PAST SURGICAL HISTORY: Past Surgical History  Procedure Laterality Date  .  Cardiolite study  11/25/2003    NORMAL  . Bone marrow transplant  2011    for MM  . Tonsillectomy       FAMILY HISTORY Family History  Problem Relation Age of Onset  . Cancer Mother   . Hypertension Father   . Stroke Father   . Asthma Father   . Diabetes Father   [name] father died at the age of 29 following a stroke.  [name] mother died with cancer of the throat at the age of 62.   She had a history of  depression and was a smoker.  Not clear how much alcohol she drank, according to the patient.  The patient had two brothers.  One died from the age of 6 from a "kidney problem."  The second one died at the age of 27 from pancreatic cancer. (This may have been duodenal cancer. The patient is not sure.)   SOCIAL HISTORY: [name] works as a Surveyor, minerals.  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  He is currently employed as a Paramedic.  His wife of 37 years is [name].  Unfortunately, they are in the process of divorce. The patient has two daughters:  [name] who is 23, and Skare who is 34.  They both live here in [place]. 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  The patient is very close to his daughters.  Thomann has one daughter, [name], born 03/06/2013.  The patient is not a church attender.  He does derive a great deal of support from friends at the "[employer]" which he attends very regularly, and also participates in Al-Anon even though actually there is no alcohol or drug problem in the family.  He participates in this group and he gets quite a bit of support from it.       ADVANCED DIRECTIVES: In place   HEALTH MAINTENANCE: History  Substance Use Topics  . Smoking status: Former Smoker -- 3 years    Quit date: 03/27/1969  . Smokeless tobacco: Never Used  . Alcohol Use: No     Colonoscopy:  Not on file  PSA: 09/27/2012  Lipid panel: 11/15/2012  Allergies  Allergen Reactions  . Crestor (Rosuvastatin Calcium)     ADVERSE EFFECTS ON LIVER  . Lipitor (Atorvastatin Calcium)     Current Outpatient Prescriptions  Medication Sig Dispense Refill  . aspirin EC 81 MG tablet Take 81 mg by mouth every morning.      . cholecalciferol (VITAMIN D) 1000 UNITS tablet Take 1,000 Units by mouth every morning.      . diphenhydramine-acetaminophen (TYLENOL PM) 25-500 MG TABS Take 1 tablet by mouth at bedtime.       [name] escitalopram (LEXAPRO) 10 MG  tablet Take 10 mg by mouth at bedtime.      [name] ezetimibe (ZETIA) 10 MG tablet Take 10 mg by mouth every morning.      [name] ibuprofen (ADVIL,MOTRIN) 200 MG tablet Take 400 mg by mouth every 8 (eight) hours as needed for pain.       [name] levothyroxine (SYNTHROID, LEVOTHROID) 88 MCG tablet Take 88 mcg by mouth daily before breakfast.      . loratadine (CLARITIN) 10 MG tablet Take 10 mg by mouth every morning.      [name] omeprazole (PRILOSEC) 40 MG capsule Take 40 mg by mouth every morning.      . [DISCONTINUED] fexofenadine (ALLEGRA) 60 MG tablet Take 180 mg  by mouth daily.         No current facility-administered medications for this visit.    OBJECTIVE: Middle-aged white male in no acute distress Filed Vitals:   04/11/13 1006  BP: 125/76  Pulse: 73  Temp: 98.5 F (36.9 C)  Resp: 20     Body mass index is 27.12 kg/(m^2).    ECOG FS: 1,  Zubrod Perf. Status Scale 0,   Filed Weights   04/11/13 1006  Weight: 183 lb 11.2 oz (83.326 kg)   General appearance: Alert, cooperative, well nourished, no apparent distress Head: Normocephalic, without obvious abnormality, atraumatic Eyes: Injected left sclera, right conjunctiva/corneas was clear, PERRLA, EOMI Nose: Nares, septum and mucosa are normal, no drainage or sinus tenderness Neck: No adenopathy, supple, symmetrical, trachea midline, no tenderness Resp: Clear to auscultation bilaterally Cardio: Regular rate and rhythm, S1, S2 normal, no murmur, click, rub or gallop GI: Soft, distended, non-tender, hypoactive bowel sounds, no organomegaly Extremities: Extremities normal, atraumatic, no cyanosis or edema Lymph nodes: Cervical, supraclavicular, and axillary nodes normal Neurologic: Grossly normal, no peripheral motor neuropathy or peripheral sensory neuropathy.   LAB RESULTS: Results for JAVARUS, DORNER (MRN [id number]) as of 03/19/2013 09:10  Ref. Range 08/12/2012 10:18 10/01/2012 08:40 11/05/2012 08:06 01/27/2013 00:00 03/10/2013 13:52 03/25/2013 14:10   M-SPIKE, % No range found NOT DET NOT DET NOT DET 0.19 0.27 0.32     Results for [facility] (MRN [id number]) as of 03/19/2013 09:10  Ref. Range 10/01/2012 08:40 11/05/2012 08:06 01/27/2013 00:00 01/27/2013 13:22 03/10/2013 13:52 03/25/2013 14:10  Kappa free light chain Latest Range: 0.33-1.94 mg/dL 7.82 9.56  2.13 0.86 (H) 2.52 (H)  Lambda Free Lght Chn Latest Range: 0.57-2.63 mg/dL 5.78 4.69  6.29 5.28 (H) 2.51  Kappa:Lambda Ratio Latest Range: 0.26-1.65  0.70 0.93  0.84 0.95 1.00     Lab Results  Component Value Date   WBC 3.9* 04/11/2013   NEUTROABS 1.9 04/11/2013   HGB 12.0* 04/11/2013   HCT 35.2* 04/11/2013   MCV 91.6 04/11/2013   PLT 150 04/11/2013      Chemistry      Component Value Date/Time   NA 140 04/11/2013 0942   NA 140 04/10/2012 1123   K 4.2 04/11/2013 0942   K 4.2 04/10/2012 1123   CL 107 03/10/2013 1352   CL 103 04/10/2012 1123   CO2 22 04/11/2013 0942   CO2 26 04/10/2012 1123   BUN 23.0 04/11/2013 0942   BUN 16 04/10/2012 1123   CREATININE 1.0 04/11/2013 0942   CREATININE 1.49* 04/10/2012 1123      Component Value Date/Time   CALCIUM 9.0 04/11/2013 0942   CALCIUM 9.9 04/10/2012 1123   ALKPHOS 63 04/11/2013 0942   ALKPHOS 38* 09/27/2012 1132   AST 23 04/11/2013 0942   AST 25 09/27/2012 1132   ALT 19 04/11/2013 0942   ALT 26 09/27/2012 1132   BILITOT 0.27 04/11/2013 0942   BILITOT 0.6 09/27/2012 1132      STUDIES: Nm Pet Image Restag (ps) Skull Base To Thigh 04/01/2013   **ADDENDUM** CREATED: 04/01/2013 15:26:22  Diffuse uptake in the thyroid gland most consistent with thyroiditis.  **END ADDENDUM** SIGNED BY: Cyndie Chime, M.D.  04/01/2013   *RADIOLOGY REPORT*  Clinical Data: Subsequent treatment strategy for multiple myeloma.  NUCLEAR MEDICINE PET SKULL BASE TO THIGH  Fasting Blood Glucose:  79  Technique:  16.9 mCi F-18 FDG was injected intravenously. CT data was obtained and used for attenuation correction and anatomic localization  only.  (This was not acquired as a  diagnostic CT examination.) Additional exam technical data entered on technologist worksheet.  Comparison:  Chest CT 10/15/2012  Findings:  Neck: No hypermetabolic lymph nodes in the neck.  Chest:  No hypermetabolic mediastinal or hilar nodes.  No suspicious pulmonary nodules on the CT scan.  Abdomen/Pelvis:  No abnormal hypermetabolic activity within the liver, pancreas, adrenal glands, or spleen.  No hypermetabolic lymph nodes in the abdomen or pelvis.  Skeleton:  Slight increased uptake around the right glenohumeral joint is likely due to inflammation.  No destructive bone lesion. No hypermetabolic bone lesions are identified although there is diffuse osseous myelomatous disease as noted on the recent bone survey.  I do not see a pathologic fracture or obvious soft tissue component.  IMPRESSION:  1.  Diffuse osseous myelomatous disease.  No pathologic fracture or soft tissue component. 2.  Otherwise, negative PET CT.  Original Report Authenticated By: Rudie Meyer, M.D.   Dg Bone Survey Met 04/01/2013   *RADIOLOGY REPORT*  Clinical Data: 62 year old male with multiple myeloma.  METASTATIC BONE SURVEY  Comparison: 10/14/2010.  Findings: Abnormal mineralization of the calvarium with chronic numerous small lytic lesions.  Overall no significant interval change; some areas demonstrate slightly more lesion while some areas demonstrate slightly less.  C-spine bone mineralization is stable.  Stable lower cervical spine disc and endplate degeneration.  Thoracic spine and visualized thorax bone mineralization is stable. Thoracic osteopenia but no pathologic vertebral fracture identified.  Lumbar spine bone mineralization is stable.  Osteopenia but no lumbar spine pathologic fracture.  Stable to mildly progressed lumbar disc and endplate degeneration at L2-L3.  Stable and relatively normal for age pelvis bone mineralization.  Stable bone mineralization and both femurs with scattered small fairly subtle lytic lesions.   Stable more severe lytic lesions in the bilateral humeri.  No humerus pathologic fracture.  These images also include the ribs which appears stable with no expansile or destructive rib lesion identified.  There are stable mild lytic lesions in the distal left clavicle.  IMPRESSION: Overall stable plain radiographic findings of multiple myeloma since 10/14/2010.  Extensive lytic lesions in the calvarium and both humeri.  Osteopenia in the spine.  No pathologic fracture identified.   Original Report Authenticated By: Erskine Speed, M.D.     ASSESSMENT: Mr. Carrier is a 62 y.o.  [place], [place] man with a history of multiple myeloma that was diagnosed 10/2010, with an initial M-spike of 2.6 g/dL, IFE showing an IgG lambda paraprotein and free lambda light chains in the urine. Cytogenetics showed trisomy 9. Bone marrow biopsy showed a 22% plasmacytosis. Treated with:   (1) Bortezomib (subcutaneously), Lenalidomide, and Dexamethasone, with repeat bone marrow biopsy 01/2011 showing 10% plasmacytosis.  (2) High-dose chemotherapy with BCNU and Melphalan at [facility] followed by stem cell rescue in 03/2011.  (3) On Zoledronic acid started 08/2011, initially monthly, currently given every 3 months, most recent dose 01/28/2013.  (4) Low-dose Lenalidomide resumed 12/2011, interrupted several times since.  Resumed again on 02/19/2013, at a dose of 5 mg daily, 21 days on and 7 days off.  Discontinued in 03/2013.  (5) CNS symptoms and abnormal brain MRI extensively evaluated by neurology with no definitive diagnosis established; improving.   (6) Rising M spike noted in 02/2013.  (7) Left eye irritation  (8) Constipation   PLAN:  Dr. Darnelle Catalan previously discussed with Mr. Ledo in detail that it appears his myeloma is again active. The fact that the M  spike continued to rise despite his being on 5 mg of Lenalidomide daily tells [place] that treatment, at least at that dose, is not going to control the  disease.  Aggressive myeloma treatment was discussed and agreed upon by Dr. Darnelle Catalan and Mr. Furukawa.   Currently Mr. Arrazola is undergoing SWOG [phone] protocol screening exams to determine eligibility to participate in the protocol.   Accordingly, Dr. Darnelle Catalan would treat him with Carfilzomib and Dexamethasone, preferably within the context of this study.  Mr. Huckeby is asked to flush irritated left his eye with normal saline and to take rate attained 10 mg by mouth daily and 1 Benadryl 25 mg capsule by mouth daily.  The patient was asked to go to the ER urgent care if his eye is not clearing within the next few days or if his eye symptoms worsen (i.e. swelling, discharge).  Mr. Hillis and I discussed several measures for alleviating constipation including increased hydration, probiotics, and decreased fiber intake, stool softeners, and MiraLAX as directed.  We plan to see Mr. Deshler again on 04/22/2013 at which time, Mr. Cameron Ali is scheduled to begin treatment with Carfilzomib and Dexamethasone.  He is also scheduled to receive Zometa on that day.  We will check laboratories of CBC and CMP during his next office visit.  All questions answered.  Mr. Custis was encouraged to contact us in the interim if he has any problems, concerns, or questions.   Larina Bras NP-C,  04/11/2013  2:48 PM

## 2013-04-13 ENCOUNTER — Other Ambulatory Visit (HOSPITAL_COMMUNITY): Payer: Self-pay | Admitting: Oncology

## 2013-04-13 NOTE — Progress Notes (Signed)
Repeat chromosomal analysis from bone marrow biopsy 03/31/2013 showed normal male chromosomes.

## 2013-04-14 ENCOUNTER — Ambulatory Visit: Payer: Commercial Managed Care - PPO

## 2013-04-14 ENCOUNTER — Ambulatory Visit: Payer: Commercial Managed Care - PPO | Admitting: Lab

## 2013-04-14 ENCOUNTER — Other Ambulatory Visit: Payer: Self-pay | Admitting: *Deleted

## 2013-04-14 DIAGNOSIS — C9000 Multiple myeloma not having achieved remission: Secondary | ICD-10-CM

## 2013-04-14 MED ORDER — OMEPRAZOLE 40 MG PO CPDR: 40.0000 mg | DELAYED_RELEASE_CAPSULE | Freq: Every morning | ORAL | Status: DC

## 2013-04-14 NOTE — Telephone Encounter (Signed)
NO NOTE. REFILL ONLY.

## 2013-04-15 ENCOUNTER — Ambulatory Visit: Payer: Commercial Managed Care - PPO

## 2013-04-15 ENCOUNTER — Other Ambulatory Visit: Payer: Commercial Managed Care - PPO | Admitting: Lab

## 2013-04-15 LAB — SPEP & IFE WITH QIG
Alpha-2-Globulin: 10 % (ref 7.1–11.8)
Beta Globulin: 7 % (ref 4.7–7.2)
Gamma Globulin: 13.1 % (ref 11.1–18.8)
IgA: 104 mg/dL (ref 68–379)
IgG (Immunoglobin G), Serum: 798 mg/dL (ref 650–1600)
M-Spike, %: 0.31 g/dL

## 2013-04-15 LAB — KAPPA/LAMBDA LIGHT CHAINS
Kappa free light chain: 1.81 mg/dL (ref 0.33–1.94)
Kappa:Lambda Ratio: 0.71 (ref 0.26–1.65)

## 2013-04-15 LAB — PHOSPHORUS: Phosphorus: 2.4 mg/dL (ref 2.3–4.6)

## 2013-04-15 LAB — IGE: IgE (Immunoglobulin E), Serum: 2413.6 IU/mL — ABNORMAL HIGH (ref 0.0–180.0)

## 2013-04-16 ENCOUNTER — Ambulatory Visit: Payer: Commercial Managed Care - PPO

## 2013-04-16 LAB — UPEP/TP, 24-HR URINE: Total Volume, Urine: 2100 mL

## 2013-04-17 LAB — UIFE/LIGHT CHAINS/TP QN, 24-HR UR
Albumin, U: DETECTED
Alpha 1, Urine: DETECTED — AB
Alpha 2, Urine: DETECTED — AB
Beta, Urine: DETECTED — AB
Free Lambda Excretion/Day: 1.68 mg/d
Gamma Globulin, Urine: DETECTED — AB

## 2013-04-18 ENCOUNTER — Telehealth: Payer: Self-pay | Admitting: Oncology

## 2013-04-18 ENCOUNTER — Other Ambulatory Visit: Payer: Self-pay | Admitting: Oncology

## 2013-04-18 ENCOUNTER — Other Ambulatory Visit: Payer: Self-pay | Admitting: *Deleted

## 2013-04-18 MED ORDER — LENALIDOMIDE 25 MG PO CAPS: ORAL_CAPSULE | ORAL | Status: DC

## 2013-04-18 NOTE — Telephone Encounter (Signed)
Per 8/1 pof cx all visits/labs/tx and schedule for f/u w/GM 8/25 @ 2:30 (although call) and lb 8/21. S/w pt he is aware and has been given new lab date for 8/21 and f/u for 8/25.

## 2013-04-18 NOTE — Progress Notes (Signed)
After a full workup it turns out Andre [name]'s myeloma, while a bit more active, does not meet criteria for true progression, and so he cannot enroll in the carfilzomib study. I called him to discuss the situation. We're going to try 25 mg of lenalidomide daily for 14 days, off 7 days, and check labs in 4 weeks and 8 weeks. He has a good understanding of this plan and is in agreement with it. He knows to call for any side effects or other concerns.

## 2013-04-21 ENCOUNTER — Ambulatory Visit: Payer: Commercial Managed Care - PPO

## 2013-04-21 ENCOUNTER — Telehealth: Payer: Self-pay | Admitting: *Deleted

## 2013-04-21 NOTE — Telephone Encounter (Signed)
[facility] faxed revlimid refill request.  Request to provider for review.  August 1st refill noted but no authorization number.

## 2013-04-22 ENCOUNTER — Other Ambulatory Visit: Payer: Commercial Managed Care - PPO

## 2013-04-22 ENCOUNTER — Ambulatory Visit: Payer: Commercial Managed Care - PPO

## 2013-04-22 ENCOUNTER — Ambulatory Visit: Payer: Commercial Managed Care - PPO | Admitting: Oncology

## 2013-04-22 ENCOUNTER — Other Ambulatory Visit: Payer: Commercial Managed Care - PPO | Admitting: Lab

## 2013-04-23 ENCOUNTER — Ambulatory Visit: Payer: Commercial Managed Care - PPO

## 2013-04-29 ENCOUNTER — Other Ambulatory Visit: Payer: Commercial Managed Care - PPO | Admitting: Lab

## 2013-04-29 ENCOUNTER — Ambulatory Visit: Payer: Commercial Managed Care - PPO

## 2013-04-30 ENCOUNTER — Encounter: Payer: Self-pay | Admitting: Oncology

## 2013-04-30 ENCOUNTER — Ambulatory Visit: Payer: Commercial Managed Care - PPO

## 2013-05-06 ENCOUNTER — Ambulatory Visit: Payer: Commercial Managed Care - PPO

## 2013-05-06 ENCOUNTER — Other Ambulatory Visit: Payer: Commercial Managed Care - PPO | Admitting: Lab

## 2013-05-07 ENCOUNTER — Ambulatory Visit: Payer: Commercial Managed Care - PPO

## 2013-05-08 ENCOUNTER — Other Ambulatory Visit (HOSPITAL_BASED_OUTPATIENT_CLINIC_OR_DEPARTMENT_OTHER): Payer: Commercial Managed Care - PPO | Admitting: Lab

## 2013-05-08 DIAGNOSIS — C9000 Multiple myeloma not having achieved remission: Secondary | ICD-10-CM

## 2013-05-08 LAB — COMPREHENSIVE METABOLIC PANEL (CC13)
ALT: 21 U/L (ref 0–55)
AST: 17 U/L (ref 5–34)
Albumin: 3.6 g/dL (ref 3.5–5.0)
Alkaline Phosphatase: 60 U/L (ref 40–150)
Calcium: 9 mg/dL (ref 8.4–10.4)
Chloride: 108 mEq/L (ref 98–109)
Potassium: 4.4 mEq/L (ref 3.5–5.1)
Sodium: 140 mEq/L (ref 136–145)
Total Protein: 6.9 g/dL (ref 6.4–8.3)

## 2013-05-08 LAB — CBC WITH DIFFERENTIAL/PLATELET
EOS%: 6.1 % (ref 0.0–7.0)
HGB: 11.8 g/dL — ABNORMAL LOW (ref 13.0–17.1)
MCH: 30.5 pg (ref 27.2–33.4)
MCV: 92.2 fL (ref 79.3–98.0)
MONO%: 20.8 % — ABNORMAL HIGH (ref 0.0–14.0)
NEUT#: 2.6 10*3/uL (ref 1.5–6.5)
RBC: 3.88 10*6/uL — ABNORMAL LOW (ref 4.20–5.82)
RDW: 16.4 % — ABNORMAL HIGH (ref 11.0–14.6)
lymph#: 1.4 10*3/uL (ref 0.9–3.3)

## 2013-05-09 LAB — PROTEIN / CREATININE RATIO, URINE: Creatinine, Urine: 26.4 mg/dL

## 2013-05-12 ENCOUNTER — Ambulatory Visit (HOSPITAL_BASED_OUTPATIENT_CLINIC_OR_DEPARTMENT_OTHER): Payer: Commercial Managed Care - PPO | Admitting: Oncology

## 2013-05-12 ENCOUNTER — Other Ambulatory Visit: Payer: Self-pay | Admitting: *Deleted

## 2013-05-12 ENCOUNTER — Telehealth: Payer: Self-pay | Admitting: *Deleted

## 2013-05-12 VITALS — BP 110/72 | HR 55 | Temp 98.6°F | Resp 20 | Ht 69.0 in | Wt 180.8 lb

## 2013-05-12 DIAGNOSIS — C9000 Multiple myeloma not having achieved remission: Secondary | ICD-10-CM

## 2013-05-12 LAB — SPEP & IFE WITH QIG
Albumin ELP: 59.7 % (ref 55.8–66.1)
Beta 2: 4.4 % (ref 3.2–6.5)
Beta Globulin: 6.9 % (ref 4.7–7.2)
IgA: 147 mg/dL (ref 68–379)
IgG (Immunoglobin G), Serum: 1040 mg/dL (ref 650–1600)
IgM, Serum: 50 mg/dL (ref 41–251)
M-Spike, %: 0.38 g/dL
Total Protein, Serum Electrophoresis: 7 g/dL (ref 6.0–8.3)

## 2013-05-12 LAB — KAPPA/LAMBDA LIGHT CHAINS: Kappa free light chain: 3.51 mg/dL — ABNORMAL HIGH (ref 0.33–1.94)

## 2013-05-12 NOTE — Progress Notes (Signed)
ID: HAMILTON MARINELLO   DOB: 1951/02/12  MR#: [id number]  WUJ#:[id number]   PCP: Trinna Post [name] SU: OTHER MD: Lesia Sago, Eddie Candle, Vivi Martens Brackbill  HISTORY OF PRESENT ILLNESS: The patient was worked up for peptic ulcer disease in August of 2011, with significant bleeding and anemia.  The patient was Helicobacter pylori negative.  He received some epinephrine when he had his EGD and then started on Protonix.  [name] anemia slowly resolved so that by September, his hemoglobin was up to 10.9 and by earlier this month, his hemoglobin was up to 12.5.    As part of his general workup, he was found to have a slightly elevated globulin fraction.  In September, his total protein was 8.3 with an albumin of 3.8.  In January, the total protein was 8.4 and albumin 3.6.  With persistence of this slight abnormality, Dr. Juanda Chance obtained serum immunofixation and SPEP.  The SPTP showed an M-spike of 2.67 grams.  A total IgG was 4,190.  Total IgA low at 28.  Total IgM low at 28 also.  The immunofixation showed a monoclonal IgG lambda paraprotein.  There were also monoclonal free lambda light chains present. With this information, the patient was referred for further evaluation. A diagnosis of myeloma was confirmed by bone marrow biopsy and subsequebnt treatment is as detailed below  INTERVAL HISTORY: [name] returns today for followup of his multiple myeloma. Today is day 21 of his first cycle of full dose lenalidomide   REVIEW OF SYSTEMS: [name] is tolerating the lenalidomide well. He feels a little bit more fatigued and occasionally sneaks an afternoon nap. Recently he felt he couldn't fully participate in a spin class. Nevertheless he goes to spin 2 days a week, walks one to one a half miles every day, swims twice a week, and is planning to go [place] this weekend. He is a little bit more constipated than usual, and despite multiple discussion he still has not placed himself on an adequate  bowel prophylaxis regimen. He takes a stool softener daily. He tells me shoulder pain is better he occasionally feels a little bit dizzy, but has attributed that to dehydration, and now is trying to drink more during the day. Those symptoms have not recurred. A detailed review of systems today was otherwise noncontributory  PAST MEDICAL HISTORY: Past Medical History  Diagnosis Date  . Hypertension   . Hyperlipidemia   . Duodenal ulcer   . Multiple myeloma 07/04/2011  . Allergy   . Thyroid disease   Significant for peptic ulcer disease as noted above.  History of hyperlipidemia.  History of anxiety and depression.  History of GERD which is significantly improved with weight loss and history of reactive airway disease, possibly secondary to the GERD which also improved with weight loss.  There was a history of obesity, now much improved secondary to exercise and diet.   PAST SURGICAL HISTORY: Past Surgical History  Procedure Laterality Date  . Cardiolite study  11/25/2003    NORMAL  . Bone marrow transplant  2011    for MM  . Tonsillectomy      FAMILY HISTORY Family History  Problem Relation Age of Onset  . Cancer Mother   . Hypertension Father   . Stroke Father   . Asthma Father   . Diabetes Father   [name] father died at the age of 49 following a stroke.  [name] mother died with cancer of the throat at the age of 28.  She had a history of depression and was a smoker.  Not clear how much alcohol she drank, according to the patient.  The patient had two brothers.  One died from the age of 6 from a "kidney problem."  The second one died at the age of 75 from pancreatic cancer. (This may have been duodenal cancer. The patient is not sure.)  SOCIAL HISTORY: [name] works as a Surveyor, minerals.  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  His wife of 37+ years is [name].   Unfortunately, they are in the process of divorce. The patient has two daughters:  [name] who is 7, and Goldfarb who is 59.  They both live here in [place]. 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  The patient is very close to his daughters.  Groft has one daughter, [name], born 03/06/2013.  The patient is not a church attender.  He does derive a great deal of support from friends at the "[employer]" which he attends very regularly, and also participates in Al-Anon even though actually there is no alcohol or drug problem in the family.  He participates in this group and he gets quite a bit of support from it.      ADVANCED DIRECTIVES: In place  HEALTH MAINTENANCE: History  Substance Use Topics  . Smoking status: Former Smoker -- 3 years    Quit date: 03/27/1969  . Smokeless tobacco: Never Used  . Alcohol Use: No     Colonoscopy:  PSA:  Lipid panel:  Allergies  Allergen Reactions  . Crestor [Rosuvastatin Calcium]     ADVERSE EFFECTS ON LIVER  . Lipitor [Atorvastatin Calcium]     Current Outpatient Prescriptions  Medication Sig Dispense Refill  . aspirin EC 81 MG tablet Take 81 mg by mouth every morning.      . cholecalciferol (VITAMIN D) 1000 UNITS tablet Take 1,000 Units by mouth every morning.      . diphenhydramine-acetaminophen (TYLENOL PM) 25-500 MG TABS Take 1 tablet by mouth at bedtime.       [name] escitalopram (LEXAPRO) 10 MG tablet Take 10 mg by mouth at bedtime.      [name] ezetimibe (ZETIA) 10 MG tablet Take 10 mg by mouth every morning.      [name] ibuprofen (ADVIL,MOTRIN) 200 MG tablet Take 400 mg by mouth every 8 (eight) hours as needed for pain.       [name] lenalidomide (REVLIMID) 25 MG capsule Take one tablet daily for 14 days, then off 7 days, then repeat  28 capsule  4  . levothyroxine (SYNTHROID, LEVOTHROID) 88 MCG tablet Take 88 mcg by mouth daily before breakfast.      . loratadine (CLARITIN) 10 MG tablet Take 10 mg by mouth every morning.      [name] omeprazole (PRILOSEC) 40 MG  capsule Take 1 capsule (40 mg total) by mouth every morning.  30 capsule  4  . [DISCONTINUED] fexofenadine (ALLEGRA) 60 MG tablet Take 180 mg by mouth daily.         No current facility-administered medications for this visit.    OBJECTIVE: Middle-aged white male who appears stated age 13 Vitals:   05/12/13 1449  BP: 110/72  Pulse: 55  Temp: 98.6 F (37 C)  Resp: 20     Body mass index is 26.69 kg/(m^2).    ECOG FS: 0 Filed Weights   05/12/13 1449  Weight:  180 lb 12.8 oz (82.01 kg)   Sclerae unicteric, EOMs full, pupils equal and reactive to light Oropharynx clear No cervical or supraclavicular adenopathy Lungs clear to auscultation, good excursion bilaterally Heart regular rate and rhythm, no murmur noted Abd soft, nontender, positive bowel sounds MSK no focal spinal tenderness , no peripheral edema Neuro: nonfocal; well oriented; appropriate affect  LAB RESULTS: Repeat SPEP and light chains 05/08/2013 showed the M spike to be slightly higher at 0.38 as compared to 0.31 July 25. Lambda free light chains had increased to 3.69 from 2.55, but so had the A light chains to 3.51 from 1.81, with a result that the kappa and lambda ratio was normal at 0.95. Total IgG was 1040, up from 798 July 25. IgA was also improved at 147, up from 104. IgM was 50 as opposed to 33   Lab Results  Component Value Date   WBC 5.5 05/08/2013   NEUTROABS 2.6 05/08/2013   HGB 11.8* 05/08/2013   HCT 35.8* 05/08/2013   MCV 92.2 05/08/2013   PLT 105* 05/08/2013      Chemistry      Component Value Date/Time   NA 140 05/08/2013 1428   NA 140 04/10/2012 1123   K 4.4 05/08/2013 1428   K 4.2 04/10/2012 1123   CL 107 03/10/2013 1352   CL 103 04/10/2012 1123   CO2 21* 05/08/2013 1428   CO2 26 04/10/2012 1123   BUN 18.4 05/08/2013 1428   BUN 16 04/10/2012 1123   CREATININE 1.0 05/08/2013 1428   CREATININE 1.49* 04/10/2012 1123      Component Value Date/Time   CALCIUM 9.0 05/08/2013 1428   CALCIUM 9.9 04/10/2012  1123   ALKPHOS 60 05/08/2013 1428   ALKPHOS 38* 09/27/2012 1132   AST 17 05/08/2013 1428   AST 25 09/27/2012 1132   ALT 21 05/08/2013 1428   ALT 26 09/27/2012 1132   BILITOT 0.41 05/08/2013 1428   BILITOT 0.6 09/27/2012 1132      STUDIES: METASTATIC BONE SURVEY  Comparison: 10/14/2010.  Findings: Abnormal mineralization of the calvarium with chronic  numerous small lytic lesions. Overall no significant interval  change; some areas demonstrate slightly more lesion while some  areas demonstrate slightly less.  C-spine bone mineralization is stable. Stable lower cervical spine  disc and endplate degeneration.  Thoracic spine and visualized thorax bone mineralization is stable.  Thoracic osteopenia but no pathologic vertebral fracture  identified.  Lumbar spine bone mineralization is stable. Osteopenia but no  lumbar spine pathologic fracture. Stable to mildly progressed  lumbar disc and endplate degeneration at L2-L3.  Stable and relatively normal for age pelvis bone mineralization.  Stable bone mineralization and both femurs with scattered small  fairly subtle lytic lesions.  Stable more severe lytic lesions in the bilateral humeri. No  humerus pathologic fracture. These images also include the ribs  which appears stable with no expansile or destructive rib lesion  identified. There are stable mild lytic lesions in the distal left  clavicle.  IMPRESSION:  Overall stable plain radiographic findings of multiple myeloma  since 10/14/2010. Extensive lytic lesions in the calvarium and  both humeri. Osteopenia in the spine. No pathologic fracture  identified.   Patient: JASPER, HANF Collected: 03/31/2013 Client: [facility] Accession: [id number] Received: 03/31/2013 Art Hoss DOB: [birth year] Age: 24 Gender: M Reported: 04/02/2013 [address] Patient Ph: [phone] MRN #: [id number] [place], [place] [address] Visit #: [id number] Chart #: Phone: [phone] Fax: CC:  Ruthann Cancer,  MD BONE MARROW REPORT FINAL DIAGNOSIS Diagnosis Bone Marrow, Aspirate,Biopsy, and Clot, right iliac - SLIGHTLY HYPERCELLULAR BONE MARROW FOR AGE WITH TRILINEAGE HEMATOPOIESIS AND 4% PLASMA CELLS. - SEE COMMENT. PERIPHERAL BLOOD: - MILD NORMOCYTIC-NORMOCHROMIC ANEMIA. Diagnosis Note The plasma cells represent 4% of all cells with lack of large aggregates or sheets. Immunohistochemical stains generally show that the plasma cell display a polyclonal staining pattern for kappa and lambda light chains although in small foci, there appears to be a slight lambda light chain excess. In the presence of a monoclonal IgG lambda protein, the findings suggest plasma cell dyscrasia/neoplasm. However the findings are limited and hence clinical correlation is recommended. (BNS:kh 04-02-13) Guerry Bruin MD Pathologist, Electronic Signature (Case signed 04/02/2013) GROSS AND MICROSCOPIC INFORMATION Specimen Clinical Information multiple myeloma (kp) Source Bone Marrow, Aspirate,Biopsy, and Clot, right iliac Microscopic LAB DATA: CBC performed on 03/31/2013 shows: WBC 4.7 K/ul Neutrophils 47% HB 12.7 g/dl Lymphocytes 16% HCT 10.9% Monocytes 16% 1 of 3 FINAL for [facility],  J ([id number]) Microscopic(continued) MCV 89.0 fL Eosinophils 12% RDW 14.4 % Basophils 1% PLT 164 K/ul PERIPHERAL BLOOD SMEAR: The red blood cells display mild anisopoikilocytosis with minimal polychromasia. No rouleaux formation is identified. The white blood cells are normal in number. The neutrophils display scattered cells with mild toxic granulation but significant left shift is not seen on scan. The platelets are normal in number. BONE MARROW ASPIRATE: Erythroid precursors: Orderly and progressive maturation for the most part. Granulocytic precursors: Orderly and progressive maturation. Megakaryocytes: Abundant with predominately normal morphology. Lymphocytes/plasma cells: The plasma cells represent  4% of all cells with variable distribution associated with small clusters mostly composed of few cells. Large clusters or sheets of plasma cells are not identified. Large lymphoid aggregates are not identified. TOUCH PREPARATIONS: A mixture of cell types. CLOT and BIOPSY: The sections show 40 to 60% cellularity with a mixture of cell types. Occasional small clusters of plasma cells are identified. Significant lymphoid aggregates are not present. Immunohistochemical stains for CD138, kappa and lambda were performed on block 1A and 1B, with appropriate controls. CD138 highlights the plasma cell component present. Kappa and lambda stains show a polyclonal staining pattern in plasma cells although in small foci there appears to be slight lambda light chain excess. IRON STAIN: Iron stains are performed on a bone marrow aspirate smear and section of clot. The controls stained appropriately. Storage Iron: Scanty. Ringed Sideroblasts: Absent. ADDITIONAL DATA / TESTING:The specimen was sent for cytogenetic analysis and a separate report showed normal male chromosomes with no discernible clonal chromosomal abnormalities   Specimen Table Bone Marrow count performed on 500 cells shows: Blasts: 1% Myeloid 57% Promyelocyts: 1% Myelocytes: 10% Erythroid 27% Metamyelocyts: 3% Bands: 13% Lymphocytes: 9% Neutrophils: 24% Eosinophils: 5% Plasma Cells: 4% Basophils: 0% Monocytes: 3%    ASSESSMENT: 62 y.o.  [place] man with a history of multiple myeloma diagnosed February of 2012, with an initial M-spike of 2.6 g/dL, IFE showing an IgG lambda paraprotein and free lambda light chains in the urine. Cytogenetics showed trisomy 53. Bone marrow biopsy showed a 22% plasmacytosis. Treated with   (1) bortezomib (subcutaneously), lenalidomide, and dexamethasone, with repeat bone marrow biopsy May of 2012 showing 10% plasmacytosis   (2) high-dose chemotherapy with BCNU and melphalan at [facility], followed by stem  cell rescue July of 2012   (3) on zoledronic acid started December of 2012, initially monthly, currently given every 3 months, most recent dose 01/28/2013  (4) low-dose lenalidomide resumed April 2013, interrupted several times since.  Resumed again on 02/19/2013, and a dose of 5 mg daily, 21 days on and 7 days off.  (5) CNS symptoms and abnormal brain MRI extensively evaluated by neurology with no definitive diagnosis established; improving   (6) rising M spike noted June 2014 but did not meet criteria for carfilzomib study  (7) on lenalidomide 25 mg daily, 14 days on, 7 days off, starting 04/18/2013   PLAN:  [name] is tolerating the full dose lenalidomide well. The plan is to continue 14 days on, 7 days off. He is M spike is slightly up, but so are all his immunoglobulins. His lambda light chains are also up, but then the Light chains rose as well so that the ratio remains normal. Overall I am interpreting and is as stable after her 1 cycle of this dose of lenalidomide.  He will start his second cycle tomorrow. She will have repeat lab work September 16 and see me a week later for results. He also has an appointment with Dr. Barbaraann Boys at [facility] later this month. Finally we have scheduled him for a repeat brain MRI September 9. We will make sure Dr. Ronalee Red gets a copy of that report.  Johnknows to call for any problems that may develop before his next visit here.   Mekhi Sonn C    05/12/2013

## 2013-05-12 NOTE — Telephone Encounter (Signed)
THIS REFILL REQUEST FOR REVLIMID WAS GIVEN TO DR.MAGRINAT'S NURSE, VAL DODD,RN. 

## 2013-05-12 NOTE — Telephone Encounter (Signed)
appts made and printed. Pt is aware that cs will call him w/ appt d/t for his MRI...td

## 2013-05-13 ENCOUNTER — Other Ambulatory Visit: Payer: Self-pay | Admitting: *Deleted

## 2013-05-13 MED ORDER — LENALIDOMIDE 25 MG PO CAPS: ORAL_CAPSULE | ORAL | Status: DC

## 2013-05-21 ENCOUNTER — Telehealth: Payer: Self-pay | Admitting: Oncology

## 2013-05-21 NOTE — Telephone Encounter (Signed)
Sent medical records to [facility].

## 2013-05-23 ENCOUNTER — Other Ambulatory Visit: Payer: Self-pay | Admitting: *Deleted

## 2013-05-27 ENCOUNTER — Ambulatory Visit (HOSPITAL_COMMUNITY)
Admission: RE | Admit: 2013-05-27 | Discharge: 2013-05-27 | Disposition: A | Discharge status: Home / Self Care | Payer: Commercial Managed Care - PPO | Source: Ambulatory Visit | Attending: Oncology | Admitting: Oncology

## 2013-05-27 DIAGNOSIS — R413 Other amnesia: Secondary | ICD-10-CM | POA: Insufficient documentation

## 2013-05-27 DIAGNOSIS — C9000 Multiple myeloma not having achieved remission: Secondary | ICD-10-CM

## 2013-05-27 MED ORDER — GADOBENATE DIMEGLUMINE 529 MG/ML IV SOLN
20.0000 mL | Freq: Once | INTRAVENOUS | Status: AC | PRN
  Administered 2013-05-27: 17 mL via INTRAVENOUS

## 2013-05-28 ENCOUNTER — Telehealth: Payer: Self-pay | Admitting: *Deleted

## 2013-05-28 ENCOUNTER — Other Ambulatory Visit: Payer: Self-pay | Admitting: *Deleted

## 2013-05-28 DIAGNOSIS — C9000 Multiple myeloma not having achieved remission: Secondary | ICD-10-CM

## 2013-05-28 NOTE — Telephone Encounter (Signed)
gv pt appt for labs on 06/10/13@ 12:30pm..the patient is aware...td

## 2013-05-30 ENCOUNTER — Telehealth: Payer: Self-pay | Admitting: *Deleted

## 2013-05-30 NOTE — Telephone Encounter (Signed)
Called patient per Dr Darnelle Catalan, "Brain MRI now looks fine"

## 2013-06-02 ENCOUNTER — Telehealth: Payer: Self-pay | Admitting: *Deleted

## 2013-06-02 ENCOUNTER — Telehealth: Payer: Self-pay | Admitting: Internal Medicine

## 2013-06-02 DIAGNOSIS — E039 Hypothyroidism, unspecified: Secondary | ICD-10-CM

## 2013-06-02 NOTE — Telephone Encounter (Signed)
Per pharmacy I have scheduled appt 

## 2013-06-02 NOTE — Telephone Encounter (Signed)
Patient is going tomorrow morning for labs at the cancer center and he would like to have his labs for Dr. Posey Rea drawn a the same time, he needs an order placed for this , call the patient with any questions

## 2013-06-02 NOTE — Telephone Encounter (Signed)
OK - done Thx 

## 2013-06-03 ENCOUNTER — Other Ambulatory Visit (HOSPITAL_BASED_OUTPATIENT_CLINIC_OR_DEPARTMENT_OTHER): Payer: Commercial Managed Care - PPO | Admitting: Lab

## 2013-06-03 ENCOUNTER — Other Ambulatory Visit: Payer: Self-pay | Admitting: *Deleted

## 2013-06-03 DIAGNOSIS — C9000 Multiple myeloma not having achieved remission: Secondary | ICD-10-CM

## 2013-06-03 LAB — CBC WITH DIFFERENTIAL/PLATELET
Basophils Absolute: 0.1 10*3/uL (ref 0.0–0.1)
EOS%: 11.6 % — ABNORMAL HIGH (ref 0.0–7.0)
Eosinophils Absolute: 0.4 10*3/uL (ref 0.0–0.5)
HGB: 11.3 g/dL — ABNORMAL LOW (ref 13.0–17.1)
LYMPH%: 30.9 % (ref 14.0–49.0)
MCH: 31.3 pg (ref 27.2–33.4)
MCV: 92.7 fL (ref 79.3–98.0)
MONO%: 14.7 % — ABNORMAL HIGH (ref 0.0–14.0)
NEUT#: 1.4 10*3/uL — ABNORMAL LOW (ref 1.5–6.5)
Platelets: 90 10*3/uL — ABNORMAL LOW (ref 140–400)
RBC: 3.62 10*6/uL — ABNORMAL LOW (ref 4.20–5.82)
RDW: 17.4 % — ABNORMAL HIGH (ref 11.0–14.6)

## 2013-06-03 LAB — COMPREHENSIVE METABOLIC PANEL (CC13)
AST: 22 U/L (ref 5–34)
Alkaline Phosphatase: 66 U/L (ref 40–150)
BUN: 18.9 mg/dL (ref 7.0–26.0)
Creatinine: 1.1 mg/dL (ref 0.7–1.3)
Glucose: 81 mg/dl (ref 70–140)
Potassium: 4.4 mEq/L (ref 3.5–5.1)
Total Bilirubin: 0.29 mg/dL (ref 0.20–1.20)

## 2013-06-03 LAB — PROTEIN / CREATININE RATIO, URINE
Creatinine, Urine: 113 mg/dL
Total Protein, Urine: 6 mg/dL

## 2013-06-03 NOTE — Telephone Encounter (Signed)
Patient notified

## 2013-06-05 LAB — SPEP & IFE WITH QIG
Alpha-2-Globulin: 9.9 % (ref 7.1–11.8)
Beta 2: 4 % (ref 3.2–6.5)
Beta Globulin: 7.5 % — ABNORMAL HIGH (ref 4.7–7.2)
IgA: 189 mg/dL (ref 68–379)
IgG (Immunoglobin G), Serum: 1010 mg/dL (ref 650–1600)
M-Spike, %: 0.35 g/dL

## 2013-06-05 LAB — KAPPA/LAMBDA LIGHT CHAINS: Kappa:Lambda Ratio: 0.78 (ref 0.26–1.65)

## 2013-06-06 ENCOUNTER — Ambulatory Visit: Payer: Commercial Managed Care - PPO | Admitting: Internal Medicine

## 2013-06-10 ENCOUNTER — Ambulatory Visit (HOSPITAL_BASED_OUTPATIENT_CLINIC_OR_DEPARTMENT_OTHER): Payer: Commercial Managed Care - PPO | Admitting: Oncology

## 2013-06-10 ENCOUNTER — Other Ambulatory Visit: Payer: Self-pay | Admitting: Oncology

## 2013-06-10 ENCOUNTER — Telehealth: Payer: Self-pay | Admitting: Oncology

## 2013-06-10 ENCOUNTER — Other Ambulatory Visit (HOSPITAL_BASED_OUTPATIENT_CLINIC_OR_DEPARTMENT_OTHER): Payer: Commercial Managed Care - PPO | Admitting: Lab

## 2013-06-10 ENCOUNTER — Ambulatory Visit (HOSPITAL_BASED_OUTPATIENT_CLINIC_OR_DEPARTMENT_OTHER): Payer: Commercial Managed Care - PPO

## 2013-06-10 VITALS — BP 123/76 | HR 48 | Temp 97.9°F | Resp 18 | Ht 69.0 in | Wt 181.6 lb

## 2013-06-10 DIAGNOSIS — C9000 Multiple myeloma not having achieved remission: Secondary | ICD-10-CM

## 2013-06-10 LAB — CBC WITH DIFFERENTIAL/PLATELET
Basophils Absolute: 0 10*3/uL (ref 0.0–0.1)
Eosinophils Absolute: 0.4 10*3/uL (ref 0.0–0.5)
HCT: 37.3 % — ABNORMAL LOW (ref 38.4–49.9)
HGB: 12.3 g/dL — ABNORMAL LOW (ref 13.0–17.1)
LYMPH%: 28.8 % (ref 14.0–49.0)
MCHC: 32.9 g/dL (ref 32.0–36.0)
MCV: 92.8 fL (ref 79.3–98.0)
MONO#: 0.2 10*3/uL (ref 0.1–0.9)
MONO%: 8.6 % (ref 0.0–14.0)
NEUT#: 1.4 10*3/uL — ABNORMAL LOW (ref 1.5–6.5)
Platelets: 111 10*3/uL — ABNORMAL LOW (ref 140–400)
RBC: 4.02 10*6/uL — ABNORMAL LOW (ref 4.20–5.82)
RDW: 17.7 % — ABNORMAL HIGH (ref 11.0–14.6)

## 2013-06-10 LAB — BASIC METABOLIC PANEL (CC13)
BUN: 14.8 mg/dL (ref 7.0–26.0)
CO2: 23 mEq/L (ref 22–29)
Calcium: 9.4 mg/dL (ref 8.4–10.4)
Glucose: 69 mg/dl — ABNORMAL LOW (ref 70–140)

## 2013-06-10 MED ORDER — SODIUM CHLORIDE 0.9 % IJ SOLN
10.0000 mL | INTRAMUSCULAR | Status: DC | PRN
  Filled 2013-06-10: qty 10

## 2013-06-10 MED ORDER — ZOLEDRONIC ACID 4 MG/100ML IV SOLN
4.0000 mg | Freq: Once | INTRAVENOUS | Status: AC
  Administered 2013-06-10: 4 mg via INTRAVENOUS
  Filled 2013-06-10: qty 100

## 2013-06-10 NOTE — Progress Notes (Signed)
ID: Andre [name]   DOB: Jan 06, 1951  MR#: [id number]  WUJ#:[id number]   PCP: Andre [name] SU: OTHER MD: Andre [name], Andre [name], Andre [name]  HISTORY OF PRESENT ILLNESS: The patient was worked up for peptic ulcer disease in August of 2011, with significant bleeding and anemia.  The patient was Helicobacter pylori negative.  He received some epinephrine when he had his EGD and then started on Protonix.  [name] anemia slowly resolved so that by September, his hemoglobin was up to 10.9 and by earlier this month, his hemoglobin was up to 12.5.    As part of his general workup, he was found to have a slightly elevated globulin fraction.  In September, his total protein was 8.3 with an albumin of 3.8.  In January, the total protein was 8.4 and albumin 3.6.  With persistence of this slight abnormality, Dr. Juanda [name] obtained serum immunofixation and SPEP.  The SPTP showed an M-spike of 2.67 grams.  A total IgG was 4,190.  Total IgA low at 28.  Total IgM low at 28 also.  The immunofixation showed a monoclonal IgG lambda paraprotein.  There were also monoclonal free lambda light chains present. With this information, the patient was referred for further evaluation. A diagnosis of myeloma was confirmed by bone marrow biopsy and subsequebnt treatment is as detailed below  INTERVAL HISTORY: Andre [name] returns today for followup of his multiple myeloma. Today is day 8 of his 3d cycle of full dose lenalidomide   REVIEW OF SYSTEMS: Andre [name] continues to tolerate the lenalidomide well. He does have some constipation, and he really does not like a routine bowel prophylaxis. He uses Dulcolax as needed and has MiraLAX available. He is not having any neuropathy issue of concern. He's currently training for a half marathon, walk/ran 6.4 miles earlier today, swims 45 minutes 3 times a week, and of course continues to work as before. He did have an episode of what he calls "amnesia" yesterday  (06/09/2013) at home. He was unable to remember his dogs name or some names at work, though he says he remembers his family names. He had gotten up at 4 in the morning as usual, had minimal breakfast [facility] [employer] [facility], came back home, had more breakfast, and then had this episode where he noted the agnosia but without any dizziness, nausea, balance problems, visual changes, headache, or any other symptoms of any type. He suspects that perhaps he was a little bit dry. In addition there is unusual stress in his life currently partly because of marital issues, partly because his granddaughter has been found to have multiple hemangiomas (she is only 50 months old), and he is very wary about his daughter Andre [name], of course is also worried about his cancer or issues. The agnosia resolved spontaneously after about an hour and has had no further episodes Otherwise though a detailed review of systems today was entirely noncontributory  PAST MEDICAL HISTORY: Past Medical History  Diagnosis Date  . Hypertension   . Hyperlipidemia   . Duodenal ulcer   . Multiple myeloma 07/04/2011  . Allergy   . Thyroid disease   Significant for peptic ulcer disease as noted above.  History of hyperlipidemia.  History of anxiety and depression.  History of GERD which is significantly improved with weight loss and history of reactive airway disease, possibly secondary to the GERD which also improved with weight loss.  There was a history of obesity, now much improved secondary to  exercise and diet.   PAST SURGICAL HISTORY: Past Surgical History  Procedure Laterality Date  . Cardiolite study  11/25/2003    NORMAL  . Bone marrow transplant  2011    for MM  . Tonsillectomy      FAMILY HISTORY Family History  Problem Relation Age of Onset  . Cancer Mother   . Hypertension Father   . Stroke Father   . Asthma Father   . Diabetes Father   [name] father died at the age of 4 following a stroke.  The  patient's mother died with cancer of the throat at the age of 20.   She had a history of depression and was a smoker.  Not clear how much alcohol she drank, according to the patient.  The patient had two brothers.  One died from the age of 6 from a "kidney problem."  The second one died at the age of 88 from pancreatic cancer. (This may have been duodenal cancer. 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  His wife of 37+ years is Andre [name].  Unfortunately, they are in the process of divorce. The patient has two daughters:  Andre [name] who is 26, and Andre [name] who is 34.  They both live here in [place]. 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  The patient is very close to his daughters.  Andre [name] has one daughter, Andre [name], born 03/06/2013.  The patient is not a church attender.  He does derive a great deal of support from friends at the [employer] which he attends very regularly, and also participates in Al-Anon even though actually there is no alcohol or drug problem in the family.  He participates in this group and he gets quite a bit of support from it.      ADVANCED DIRECTIVES: In place  HEALTH MAINTENANCE: History  Substance Use Topics  . Smoking status: Former Smoker -- 3 years    Quit date: 03/27/1969  . Smokeless tobacco: Never Used  . Alcohol Use: No     Colonoscopy:  PSA:  Lipid panel:  Allergies  Allergen Reactions  . Crestor [Rosuvastatin Calcium]     ADVERSE EFFECTS ON LIVER  . Lipitor [Atorvastatin Calcium]     Current Outpatient Prescriptions  Medication Sig Dispense Refill  . aspirin EC 81 MG tablet Take 81 mg by mouth every morning.      . cholecalciferol (VITAMIN D) 1000 UNITS tablet Take 1,000 Units by mouth every morning.      . diphenhydramine-acetaminophen (TYLENOL PM) 25-500 MG TABS Take 1 tablet by  mouth at bedtime.       [name] escitalopram (LEXAPRO) 10 MG tablet Take 10 mg by mouth at bedtime.      [name] ezetimibe (ZETIA) 10 MG tablet Take 10 mg by mouth every morning.      [name] ibuprofen (ADVIL,MOTRIN) 200 MG tablet Take 400 mg by mouth every 8 (eight) hours as needed for pain.       [name] lenalidomide (REVLIMID) 25 MG capsule Take one tablet daily for 14 days, then off 7 days, then repeat  21 capsule  0  . levothyroxine (SYNTHROID, LEVOTHROID) 88 MCG tablet Take 88 mcg by mouth daily before breakfast.      . loratadine (CLARITIN) 10 MG tablet Take 10 mg by mouth every morning.      [name]  omeprazole (PRILOSEC) 40 MG capsule Take 1 capsule (40 mg total) by mouth every morning.  30 capsule  4  . [DISCONTINUED] fexofenadine (ALLEGRA) 60 MG tablet Take 180 mg by mouth daily.         No current facility-administered medications for this visit.    OBJECTIVE: Middle-aged white male in no acute distress Filed Vitals:   06/10/13 1246  BP: 123/76  Pulse: 48  Temp: 97.9 F (36.6 [name])  Resp: 18     Body mass index is 26.81 kg/(m^2).    ECOG FS: 0 Filed Weights   06/10/13 1246  Weight: 181 lb 9.6 oz (82.373 kg)   Sclerae unicteric, EOMs full, pupils equal and reactive to light Oropharynx clear, good dentition No cervical or supraclavicular adenopathy Lungs clear to auscultation, good excursion bilaterally Heart regular rate and rhythm, no murmur noted Abd soft, nontender, positive bowel sounds MSK no focal spinal tenderness , no peripheral edema Neuro: nonfocal; well oriented; appropriate affect  LAB RESULTS: (latest is first)  M-Spike, % g/dL  1.61    0.96    0.45    0.32    0.27    0.19     Range 1wk ago (06/03/13) 68mo ago (05/08/13) 55mo ago (04/11/13) 55mo ago (03/25/13) 89mo ago (03/10/13) 30mo ago (01/27/13) 31mo ago (11/05/12) Kappa free light chain 0.33 - 1.94 mg/dL  4.09 (H)    8.11 (H)    1.81    2.52 (H)    3.47 (H)    1.30    1.38   Lambda Free Lght Chn 0.57 - 2.63  mg/dL  9.14 (H)    7.82 (H)    2.55    2.51    3.67 (H)    1.54    1.48   Kappa:Lambda Ratio 0.26 - 1.65  0.78    0.95    0.71    1.00    0.95    0.84    0.93        Lab Results  Component Value Date   WBC 2.9* 06/10/2013   NEUTROABS 1.4* 06/10/2013   HGB 12.3* 06/10/2013   HCT 37.3* 06/10/2013   MCV 92.8 06/10/2013   PLT 111* 06/10/2013      Chemistry      Component Value Date/Time   NA 138 06/03/2013 0817   NA 140 04/10/2012 1123   K 4.4 06/03/2013 0817   K 4.2 04/10/2012 1123   CL 107 03/10/2013 1352   CL 103 04/10/2012 1123   CO2 24 06/03/2013 0817   CO2 26 04/10/2012 1123   BUN 18.9 06/03/2013 0817   BUN 16 04/10/2012 1123   CREATININE 1.1 06/03/2013 0817   CREATININE 1.49* 04/10/2012 1123      Component Value Date/Time   CALCIUM 9.0 06/03/2013 0817   CALCIUM 9.9 04/10/2012 1123   ALKPHOS 66 06/03/2013 0817   ALKPHOS 38* 09/27/2012 1132   AST 22 06/03/2013 0817   AST 25 09/27/2012 1132   ALT 19 06/03/2013 0817   ALT 26 09/27/2012 1132   BILITOT 0.29 06/03/2013 0817   BILITOT 0.6 09/27/2012 1132      STUDIES: Mr Damek Ende Wo Contrast  2013-05-28   CLINICAL DATA:  62 year old male with multiple myeloma. History of memory loss and confusion.  EXAM: MRI HEAD WITHOUT AND WITH CONTRAST  TECHNIQUE: Multiplanar, multiecho pulse sequences of the brain and surrounding structures were obtained according to standard protocol without and with intravenous contrast  CONTRAST:  20 mL MultiHance  COMPARISON:  Brain MRI without contrast 05/13/2012, MRI without and with contrast 03/27/2012.  FINDINGS: Interval decreased patchy and periventricular cerebral white matter FLAIR hyperintensity, with signal changes now more resembling those on 03/27/2012. Occasional white matter areas are increased in T2 and FLAIR signal since that July COMPARISON (left periatrial white matter). No associated enhancement or mass effect.  No restricted diffusion to suggest acute infarction. No midline shift,  mass effect, evidence of mass lesion, ventriculomegaly, extra-axial collection or acute intracranial hemorrhage. Cervicomedullary junction and pituitary are within normal limits. Major intracranial vascular flow voids are preserved.  No cortical encephalomalacia. Signal in the deep gray matter nuclei and brainstem remains within normal limits.  Heterogeneous bone marrow signal throughout the calvarium appears stable. Associated heterogeneous enhancement is stable. No dural thickening or abnormal dural enhancement. Negative visualized cervical spine.  Visualized orbit soft tissues are within normal limits. Stable mild paranasal sinus mucosal thickening. Mastoids remain clear. Negative negative scalp soft tissues.  IMPRESSION: 1. Regressed nonspecific cerebral white matter nonenhancing signal changes since 05/13/2012, mildly increased relative to the 03/27/2012 study. No new or acute intracranial abnormality.  2. Stable heterogeneous marrow signal throughout the calvarium. Negative visualized skullbase and upper cervical spine.   Electronically Signed   By: Augusto Gamble M.D.   On: 05/27/2013 09:35   ASSESSMENT: 62 y.o.  Andre [name] with a history of multiple myeloma diagnosed February of 2012, with an initial M-spike of 2.6 g/dL, IFE showing an IgG lambda paraprotein and free lambda light chains in the urine. Cytogenetics showed trisomy 56. Bone marrow biopsy showed a 22% plasmacytosis. Treated with   (1) bortezomib (subcutaneously), lenalidomide, and dexamethasone, with repeat bone marrow biopsy May of 2012 showing 10% plasmacytosis   (2) high-dose chemotherapy with BCNU and melphalan at [facility], followed by stem cell rescue July of 2012   (3) on zoledronic acid started December of 2012, initially monthly, currently given every 3 months, most recent dose 01/28/2013  (4) low-dose lenalidomide resumed April 2013, interrupted several times since.  Resumed again on 02/19/2013, and a dose of 5 mg daily, 21 days on  and 7 days off.  (5) CNS symptoms and abnormal brain MRI extensively evaluated by neurology with no definitive diagnosis established; improving   (6) rising M spike noted June 2014 but did not meet criteria for carfilzomib study  (7) on lenalidomide 25 mg daily, 14 days on, 7 days off, starting 04/18/2013   PLAN:  Andre [name] is doing well as far as his myeloma is concerned. His platelets are a little bit up today, and his hemoglobin is up. His white cell count is a little down, which is consistent with this treatment. There has not been a significant change in his kappa/lambda ratio, which remains normal, or his M spike, which remains at about 0.35. The plan is to continue as before, 2 weeks on, 1 week off, and we will see him again at the beginning of his sixth cycle, with labs a few days before. He really has an appointment with Dr. Barbaraann Boys 4 November.  I am not sure what the "and knees he a" (actually agnosia) episode was all about. It could have been dehydration, as he suspects, he is certainly under a lot of family stress in addition to his medical issues at this point. His MRI of the brain is reassuring in this regard as was exam today. However I did alert him to let me know if he has any further episodes similar to the one yesterday.  He knows to call for any problems that may develop before his next visit here.   Andre [name]    06/10/2013

## 2013-06-10 NOTE — Addendum Note (Signed)
Addended by: Billey Co on: 06/10/2013 05:21 PM   Modules accepted: Medications

## 2013-06-10 NOTE — Patient Instructions (Addendum)
Zoledronic Acid injection (Hypercalcemia, Oncology) What is this medicine? ZOLEDRONIC ACID (ZOE le dron ik AS id) lowers the amount of calcium loss from bone. It is used to treat too much calcium in your blood from cancer. It is also used to prevent complications of cancer that has spread to the bone. This medicine may be used for other purposes; ask your health care provider or pharmacist if you have questions. What should I tell my health care provider before I take this medicine? They need to know if you have any of these conditions: -aspirin-sensitive asthma -dental disease -kidney disease -an unusual or allergic reaction to zoledronic acid, other medicines, foods, dyes, or preservatives -pregnant or trying to get pregnant -breast-feeding How should I use this medicine? This medicine is for infusion into a vein. It is given by a health care professional in a hospital or clinic setting. Talk to your pediatrician regarding the use of this medicine in children. Special care may be needed. Overdosage: If you think you have taken too much of this medicine contact a poison control center or emergency room at once. NOTE: This medicine is only for you. Do not share this medicine with others. What if I miss a dose? It is important not to miss your dose. Call your doctor or health care professional if you are unable to keep an appointment. What may interact with this medicine? -certain antibiotics given by injection -NSAIDs, medicines for pain and inflammation, like ibuprofen or naproxen -some diuretics like bumetanide, furosemide -teriparatide -thalidomide This list may not describe all possible interactions. Give your health care provider a list of all the medicines, herbs, non-prescription drugs, or dietary supplements you use. Also tell them if you smoke, drink alcohol, or use illegal drugs. Some items may interact with your medicine. What should I watch for while using this medicine? Visit  your doctor or health care professional for regular checkups. It may be some time before you see the benefit from this medicine. Do not stop taking your medicine unless your doctor tells you to. Your doctor may order blood tests or other tests to see how you are doing. Women should inform their doctor if they wish to become pregnant or think they might be pregnant. There is a potential for serious side effects to an unborn child. Talk to your health care professional or pharmacist for more information. You should make sure that you get enough calcium and vitamin D while you are taking this medicine. Discuss the foods you eat and the vitamins you take with your health care professional. Some people who take this medicine have severe bone, joint, and/or muscle pain. This medicine may also increase your risk for a broken thigh bone. Tell your doctor right away if you have pain in your upper leg or groin. Tell your doctor if you have any pain that does not go away or that gets worse. What side effects may I notice from receiving this medicine? Side effects that you should report to your doctor or health care professional as soon as possible: -allergic reactions like skin rash, itching or hives, swelling of the face, lips, or tongue -anxiety, confusion, or depression -breathing problems -changes in vision -feeling faint or lightheaded, falls -jaw burning, cramping, pain -muscle cramps, stiffness, or weakness -trouble passing urine or change in the amount of urine Side effects that usually do not require medical attention (report to your doctor or health care professional if they continue or are bothersome): -bone, joint, or muscle pain -  fever -hair loss -irritation at site where injected -loss of appetite -nausea, vomiting -stomach upset -tired This list may not describe all possible side effects. Call your doctor for medical advice about side effects. You may report side effects to FDA at  1-800-FDA-1088. Where should I keep my medicine? This drug is given in a hospital or clinic and will not be stored at home. NOTE: This sheet is a summary. It may not cover all possible information. If you have questions about this medicine, talk to your doctor, pharmacist, or health care provider.  2013, [employer]. (03/03/2011 9:06:58 AM)  

## 2013-06-11 ENCOUNTER — Ambulatory Visit: Payer: Commercial Managed Care - PPO | Admitting: Internal Medicine

## 2013-06-11 DIAGNOSIS — Z0289 Encounter for other administrative examinations: Secondary | ICD-10-CM

## 2013-06-13 ENCOUNTER — Encounter: Payer: Self-pay | Admitting: *Deleted

## 2013-06-13 NOTE — Progress Notes (Signed)
RECEIVED A FAX FROM [facility] CONCERNING A REFILL REQUEST FOR PT.'S REVLIMID. THIS REQUEST WAS GIVEN TO DR.MAGRINAT'S NURSE, VAL DODD,RN.

## 2013-06-16 ENCOUNTER — Other Ambulatory Visit: Payer: Self-pay | Admitting: *Deleted

## 2013-06-16 ENCOUNTER — Ambulatory Visit (INDEPENDENT_AMBULATORY_CARE_PROVIDER_SITE_OTHER): Payer: Commercial Managed Care - PPO | Admitting: *Deleted

## 2013-06-16 ENCOUNTER — Ambulatory Visit (INDEPENDENT_AMBULATORY_CARE_PROVIDER_SITE_OTHER): Payer: Commercial Managed Care - PPO | Admitting: Internal Medicine

## 2013-06-16 ENCOUNTER — Encounter: Payer: Self-pay | Admitting: Internal Medicine

## 2013-06-16 VITALS — BP 122/80 | HR 48 | Temp 97.0°F | Wt 182.0 lb

## 2013-06-16 DIAGNOSIS — E039 Hypothyroidism, unspecified: Secondary | ICD-10-CM

## 2013-06-16 DIAGNOSIS — E291 Testicular hypofunction: Secondary | ICD-10-CM

## 2013-06-16 DIAGNOSIS — Z23 Encounter for immunization: Secondary | ICD-10-CM

## 2013-06-16 DIAGNOSIS — Z0000 Encounter for general adult medical examination without abnormal findings: Secondary | ICD-10-CM

## 2013-06-16 LAB — BASIC METABOLIC PANEL
CO2: 28 mEq/L (ref 19–32)
Calcium: 8.6 mg/dL (ref 8.4–10.5)
Chloride: 106 mEq/L (ref 96–112)
Creatinine, Ser: 1.1 mg/dL (ref 0.4–1.5)
Glucose, Bld: 79 mg/dL (ref 70–99)

## 2013-06-16 LAB — TSH: TSH: 13.52 u[IU]/mL — ABNORMAL HIGH (ref 0.35–5.50)

## 2013-06-16 LAB — URINALYSIS, ROUTINE W REFLEX MICROSCOPIC
Ketones, ur: NEGATIVE
Leukocytes, UA: NEGATIVE
Specific Gravity, Urine: 1.01 (ref 1.000–1.030)
Urine Glucose: NEGATIVE
Urobilinogen, UA: 0.2 (ref 0.0–1.0)

## 2013-06-16 LAB — HEPATIC FUNCTION PANEL
AST: 26 U/L (ref 0–37)
Albumin: 3.8 g/dL (ref 3.5–5.2)
Alkaline Phosphatase: 62 U/L (ref 39–117)
Total Bilirubin: 0.5 mg/dL (ref 0.3–1.2)
Total Protein: 6.3 g/dL (ref 6.0–8.3)

## 2013-06-16 LAB — PSA: PSA: 0.95 ng/mL (ref 0.10–4.00)

## 2013-06-16 MED ORDER — LENALIDOMIDE 25 MG PO CAPS: ORAL_CAPSULE | ORAL | Status: DC

## 2013-06-16 NOTE — Assessment & Plan Note (Signed)
Off Rx 

## 2013-06-16 NOTE — Progress Notes (Signed)
   Subjective:     HPI   The patient presents for a wellness exam; he needs to address  chronic hypertension that has been well controlled with medicines; to address chronic fatigue, insomnia,  hyperlipidemia controlled with medicines as well; and to address MM, anxiety. Runnig 17 mi/wk  C/o depression.  BP Readings from Last 3 Encounters:  06/16/13 122/80  06/10/13 123/76  05/12/13 110/72   Wt Readings from Last 3 Encounters:  06/16/13 182 lb (82.555 kg)  06/10/13 181 lb 9.6 oz (82.373 kg)  05/12/13 180 lb 12.8 oz (82.01 kg)       Review of Systems  Constitutional: Positive for fatigue. Negative for diaphoresis, appetite change and unexpected weight change.  HENT: Negative for nosebleeds, congestion, sore throat, facial swelling, sneezing, trouble swallowing and neck pain.   Eyes: Negative for itching and visual disturbance.  Respiratory: Negative for cough, chest tightness and wheezing.   Cardiovascular: Negative for chest pain, palpitations and leg swelling.  Gastrointestinal: Negative for nausea, vomiting, diarrhea, blood in stool and abdominal distention.  Genitourinary: Negative for dysuria, urgency, frequency and hematuria.  Musculoskeletal: Negative for back pain, joint swelling and gait problem.  Skin: Negative for rash.  Neurological: Positive for weakness. Negative for dizziness, tremors and speech difficulty.  Psychiatric/Behavioral: Positive for dysphoric mood. Negative for suicidal ideas, sleep disturbance and agitation. The patient is nervous/anxious.        Objective:   Physical Exam  Constitutional: He is oriented to person, place, and time. He appears well-developed. No distress.  Appears tired  HENT:  Mouth/Throat: Oropharynx is clear and moist.  Eyes: Conjunctivae are normal. Pupils are equal, round, and reactive to light.  Neck: Normal range of motion. No JVD present. No thyromegaly present.  Cardiovascular: Normal rate, regular rhythm, normal  heart sounds and intact distal pulses.  Exam reveals no gallop and no friction rub.   No murmur heard. Pulmonary/Chest: Effort normal and breath sounds normal. No respiratory distress. He has no wheezes. He has no rales. He exhibits no tenderness.  Abdominal: Soft. Bowel sounds are normal. He exhibits no distension and no mass. There is no tenderness. There is no rebound and no guarding.  Genitourinary: Rectum normal and prostate normal. Guaiac negative stool.  Musculoskeletal: Normal range of motion. He exhibits no edema and no tenderness.  Lymphadenopathy:    He has no cervical adenopathy.  Neurological: He is alert and oriented to person, place, and time. He has normal reflexes. No cranial nerve deficit. He exhibits normal muscle tone. Coordination normal.  Skin: Skin is warm and dry. No rash noted.  Psychiatric: He has a normal mood and affect. His behavior is normal. Judgment and thought content normal.    Lab Results  Component Value Date   WBC 2.9* 06/10/2013   HGB 12.3* 06/10/2013   HCT 37.3* 06/10/2013   PLT 111* 06/10/2013   GLUCOSE 69* 06/10/2013   CHOL 211* 11/13/2012   TRIG 73.0 11/13/2012   HDL 44.70 11/13/2012   LDLDIRECT 155.8 11/13/2012   LDLCALC 84 03/28/2012   ALT 19 06/03/2013   AST 22 06/03/2013   NA 140 06/10/2013   K 4.0 06/10/2013   CL 107 03/10/2013   CREATININE 1.0 06/10/2013   BUN 14.8 06/10/2013   CO2 23 06/10/2013   TSH 5.79* 01/10/2013   PSA 0.46 09/27/2012   INR 0.91 03/31/2013   HGBA1C 5.4 03/27/2012          Assessment & Plan:

## 2013-06-16 NOTE — Assessment & Plan Note (Signed)
We discussed age appropriate health related issues, including available/recomended screening tests and vaccinations. We discussed a need for adhering to healthy diet and exercise. Labs/EKG were reviewed/ordered. All questions were answered.   

## 2013-06-16 NOTE — Assessment & Plan Note (Signed)
Continue with current prescription therapy as reflected on the Med list. Labs  

## 2013-06-18 LAB — NMR LIPOPROFILE WITHOUT LIPIDS
HDL Particle Number: 22.9 umol/L — ABNORMAL LOW
HDL Size: 9.4 nm
LDL Particle Number: 1169 nmol/L — ABNORMAL HIGH
LDL Size: 20.2 nm — ABNORMAL LOW
LP-IR Score: 52 — ABNORMAL HIGH
Large HDL-P: 6.9 umol/L
Large VLDL-P: 1.5 nmol/L
Small LDL Particle Number: 751 nmol/L — ABNORMAL HIGH
VLDL Size: 61.3 nm — ABNORMAL HIGH

## 2013-06-18 MED ORDER — LEVOTHYROXINE SODIUM 150 MCG PO TABS: 150.0000 ug | ORAL_TABLET | Freq: Every day | ORAL | Status: DC

## 2013-06-22 ENCOUNTER — Encounter: Payer: Self-pay | Admitting: Internal Medicine

## 2013-06-24 ENCOUNTER — Other Ambulatory Visit: Payer: Self-pay | Admitting: Internal Medicine

## 2013-06-24 MED ORDER — LEVOTHYROXINE SODIUM 150 MCG PO TABS: 150.0000 ug | ORAL_TABLET | Freq: Every day | ORAL | Status: DC

## 2013-06-26 ENCOUNTER — Other Ambulatory Visit: Payer: Self-pay | Admitting: Internal Medicine

## 2013-07-10 ENCOUNTER — Other Ambulatory Visit: Payer: Self-pay | Admitting: Physician Assistant

## 2013-07-10 ENCOUNTER — Telehealth: Payer: Self-pay | Admitting: *Deleted

## 2013-07-10 NOTE — Telephone Encounter (Signed)
Rec'd fax requesting refill on Revlimid from [facility]. Faxed back that it will be on hold until labs and OV with Zollie Scale on 07/16/13. Patient aware of plan and has some on hand at home already.

## 2013-07-11 ENCOUNTER — Other Ambulatory Visit (HOSPITAL_BASED_OUTPATIENT_CLINIC_OR_DEPARTMENT_OTHER): Payer: Commercial Managed Care - PPO | Admitting: Lab

## 2013-07-11 DIAGNOSIS — C9000 Multiple myeloma not having achieved remission: Secondary | ICD-10-CM

## 2013-07-11 LAB — COMPREHENSIVE METABOLIC PANEL (CC13)
ALT: 26 U/L (ref 0–55)
AST: 49 U/L — ABNORMAL HIGH (ref 5–34)
Albumin: 3.2 g/dL — ABNORMAL LOW (ref 3.5–5.0)
Anion Gap: 10 mEq/L (ref 3–11)
CO2: 24 mEq/L (ref 22–29)
Calcium: 8.9 mg/dL (ref 8.4–10.4)
Chloride: 105 mEq/L (ref 98–109)
Glucose: 90 mg/dl (ref 70–140)
Potassium: 3.8 mEq/L (ref 3.5–5.1)
Sodium: 139 mEq/L (ref 136–145)
Total Bilirubin: 0.46 mg/dL (ref 0.20–1.20)
Total Protein: 6.3 g/dL — ABNORMAL LOW (ref 6.4–8.3)

## 2013-07-11 LAB — CBC WITH DIFFERENTIAL/PLATELET
BASO%: 1.1 % (ref 0.0–2.0)
EOS%: 30 % — ABNORMAL HIGH (ref 0.0–7.0)
HCT: 35.6 % — ABNORMAL LOW (ref 38.4–49.9)
LYMPH%: 19.7 % (ref 14.0–49.0)
MCH: 31.3 pg (ref 27.2–33.4)
MCHC: 32.8 g/dL (ref 32.0–36.0)
MCV: 95.6 fL (ref 79.3–98.0)
MONO#: 0.4 10*3/uL (ref 0.1–0.9)
MONO%: 10.9 % (ref 0.0–14.0)
NEUT%: 38.3 % — ABNORMAL LOW (ref 39.0–75.0)
Platelets: 84 10*3/uL — ABNORMAL LOW (ref 140–400)
RBC: 3.72 10*6/uL — ABNORMAL LOW (ref 4.20–5.82)
WBC: 3.3 10*3/uL — ABNORMAL LOW (ref 4.0–10.3)

## 2013-07-11 LAB — PROTEIN / CREATININE RATIO, URINE: Total Protein, Urine: <3 mg/dL

## 2013-07-15 LAB — SPEP & IFE WITH QIG
Alpha-1-Globulin: 4 % (ref 2.9–4.9)
Alpha-2-Globulin: 9.8 % (ref 7.1–11.8)
Beta 2: 3.7 % (ref 3.2–6.5)
Beta Globulin: 7.9 % — ABNORMAL HIGH (ref 4.7–7.2)
Gamma Globulin: 13.7 % (ref 11.1–18.8)
IgA: 152 mg/dL (ref 68–379)
IgG (Immunoglobin G), Serum: 827 mg/dL (ref 650–1600)
M-Spike, %: 0.23 g/dL

## 2013-07-15 LAB — KAPPA/LAMBDA LIGHT CHAINS
Kappa free light chain: 2.65 mg/dL — ABNORMAL HIGH (ref 0.33–1.94)
Kappa:Lambda Ratio: 0.82 (ref 0.26–1.65)
Lambda Free Lght Chn: 3.23 mg/dL — ABNORMAL HIGH (ref 0.57–2.63)

## 2013-07-16 ENCOUNTER — Encounter: Payer: Self-pay | Admitting: Physician Assistant

## 2013-07-16 ENCOUNTER — Telehealth: Payer: Self-pay | Admitting: Oncology

## 2013-07-16 ENCOUNTER — Ambulatory Visit (HOSPITAL_BASED_OUTPATIENT_CLINIC_OR_DEPARTMENT_OTHER): Payer: Commercial Managed Care - PPO | Admitting: Physician Assistant

## 2013-07-16 VITALS — BP 109/69 | HR 65 | Temp 97.6°F | Resp 18 | Ht 69.0 in | Wt 179.5 lb

## 2013-07-16 DIAGNOSIS — L0390 Cellulitis, unspecified: Secondary | ICD-10-CM | POA: Insufficient documentation

## 2013-07-16 DIAGNOSIS — D61818 Other pancytopenia: Secondary | ICD-10-CM

## 2013-07-16 DIAGNOSIS — C9000 Multiple myeloma not having achieved remission: Secondary | ICD-10-CM

## 2013-07-16 DIAGNOSIS — L0291 Cutaneous abscess, unspecified: Secondary | ICD-10-CM

## 2013-07-16 MED ORDER — MUPIROCIN 2 % EX OINT: TOPICAL_OINTMENT | CUTANEOUS | Status: DC

## 2013-07-16 NOTE — Progress Notes (Signed)
ID: Andre [name]   DOB: Jul 02, 1951  MR#: [id number]  CSN#:[id number]   PCP: Sonda Primes SU: OTHER MD: Lesia Sago, Eddie Candle, Peyton Najjar  CHIEF COMPLAINT: Multiple myeloma    HISTORY OF PRESENT ILLNESS: The patient was worked up for peptic ulcer disease in August of 2011, with significant bleeding and anemia.  The patient was Helicobacter pylori negative.  He received some epinephrine when he had his EGD and then started on Protonix.  [name] anemia slowly resolved so that by September, his hemoglobin was up to 10.9 and by earlier this month, his hemoglobin was up to 12.5.    As part of his general workup, he was found to have a slightly elevated globulin fraction.  In September, his total protein was 8.3 with an albumin of 3.8.  In January, the total protein was 8.4 and albumin 3.6.  With persistence of this slight abnormality, Dr. Juanda Chance obtained serum immunofixation and SPEP.  The SPTP showed an M-spike of 2.67 grams.  A total IgG was 4,190.  Total IgA low at 28.  Total IgM low at 28 also.  The immunofixation showed a monoclonal IgG lambda paraprotein.  There were also monoclonal free lambda light chains present. With this information, the patient was referred for further evaluation.   A diagnosis of myeloma was confirmed by bone marrow biopsy and subsequebnt treatment is as detailed below  INTERVAL HISTORY: Andre [name] returns today for followup of his multiple myeloma. Today is day 1 of his fifth cycle of full dose lenalidomide, 25 mg daily, 14 days on and 7 days off. He is tolerating the medication well, and has only a few new complaints.  This morning in the shower, Andre [name] noticed an area of soreness under the left arm. He cannot feel any masses or lymph nodes, but the area is very tender to touch. He is noted no skin changes or redness. He does have very dry skin, and subsequently has quite a bit of itching, especially on his back. He's had no actual rashes.  He has a sore "raw" area inside his right nostril that has been present for several weeks. He's had this issue in the past and it tends to come and go. There no vesicles or pustules present.  Overall, Andre [name] is staying very busy. He is still running regularly, and in fact is planning to run a half marathon in 2 weeks. His family is doing well. His granddaughter (71 months old) has recently been treated in [facility] for internal hemangiomas, and is doing well.   REVIEW OF SYSTEMS: Andre [name] Has had no recent fevers, chills, drenching night sweats, increased fatigue, or unexplained weight loss. He denies any new or unusual bony pain. He is eating and drinking fairly well with no nausea. He occasionally has mild constipation, but recently that has not been a problem. She's had no increased cough, phlegm production, orthopnea, shortness of breath, chest pain, or palpitations. He denies any peripheral swelling. She's had no increased peripheral neuropathy. He denies any abnormal headaches or dizziness.  A detailed review of systems is otherwise stable and noncontributory.   PAST MEDICAL HISTORY: Past Medical History  Diagnosis Date  . Hypertension   . Hyperlipidemia   . Duodenal ulcer   . Multiple myeloma 07/04/2011  . Allergy   . Thyroid disease   Significant for peptic ulcer disease as noted above.  History of hyperlipidemia.  History of anxiety and depression.  History of GERD which is significantly improved with weight  loss and history of reactive airway disease, possibly secondary to the GERD which also improved with weight loss.  There was a history of obesity, now much improved secondary to exercise and diet.   PAST SURGICAL HISTORY: Past Surgical History  Procedure Laterality Date  . Cardiolite study  11/25/2003    NORMAL  . Bone marrow transplant  2011    for MM  . Tonsillectomy      FAMILY HISTORY Family History  Problem Relation Age of Onset  . Cancer Mother   . Hypertension Father    . Stroke Father   . Asthma Father   . Diabetes Father   [name] father died at the age of 23 following a stroke.  [name] mother died with cancer of the throat at the age of 72.   She had a history of depression and was a smoker.  Not clear how much alcohol she drank, according to the patient.  The patient had two brothers.  One died from the age of 6 from a "kidney problem."  The second one died at the age of 85 from pancreatic cancer. (This may have been duodenal cancer. 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  His wife of 37+ years was [name], but they are now divorced.   The patient has two daughters:  [name] who is 55, and Gantt who is 25.  They both live here in [place]. 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  The patient is very close to his daughters.  Dietze has one daughter, [name], born 03/06/2013.  The patient is not a church attender.  He does derive a great deal of support from friends at the "[employer] which he attends very regularly, and also participates in Al-Anon even though actually there is no alcohol or drug problem in the family.  He participates in this group and he gets quite a bit of support from it.      ADVANCED DIRECTIVES: In place  HEALTH MAINTENANCE: (updated 07/16/2013) History  Substance Use Topics  . Smoking status: Former Smoker -- 3 years    Quit date: 03/27/1969  . Smokeless tobacco: Never Used  . Alcohol Use: No     Colonoscopy:  PSA:  Sept 2014, normal/Dr. [name]  Lipid panel: Sept 2014/Dr. [name]   Allergies  Allergen Reactions  . Crestor [Rosuvastatin Calcium]     ADVERSE EFFECTS ON LIVER  . Lipitor [Atorvastatin Calcium]     Current Outpatient Prescriptions  Medication Sig Dispense Refill  . aspirin EC 81 MG tablet Take 81  mg by mouth every morning.      . Bisacodyl (DULCOLAX PO) Take 1 tablet by mouth.      . cholecalciferol (VITAMIN D) 1000 UNITS tablet Take 1,000 Units by mouth every morning.      . diphenhydramine-acetaminophen (TYLENOL PM) 25-500 MG TABS Take 1 tablet by mouth at bedtime.       [name] escitalopram (LEXAPRO) 10 MG tablet Take 10 mg by mouth at bedtime.      [name] ezetimibe (ZETIA) 10 MG tablet Take 10 mg by mouth every morning.      [name] ibuprofen (ADVIL,MOTRIN) 200 MG tablet Take 400 mg by mouth every 8 (eight) hours as needed for pain.       [name] lenalidomide (REVLIMID) 25  MG capsule Take one tablet daily for 14 days, then off 7 days, then repeat auth # 06/16/2013 [id number]  21 capsule  0  . levothyroxine (SYNTHROID, LEVOTHROID) 150 MCG tablet Take 1 tablet (150 mcg total) by mouth daily.  90 tablet  3  . loratadine (CLARITIN) 10 MG tablet Take 10 mg by mouth every morning.      [name] omeprazole (PRILOSEC) 40 MG capsule Take 1 capsule (40 mg total) by mouth every morning.  30 capsule  4  . polyethylene glycol (MIRALAX / GLYCOLAX) packet Take 17 g by mouth daily.      . predniSONE (DELTASONE) 20 MG tablet Take 40 mg by mouth daily.      . mupirocin ointment (BACTROBAN) 2 % Place small amount into each nostril twice daily x 5 days.  After application, press sides of nose together and gently massage.  22 g  0  . [DISCONTINUED] fexofenadine (ALLEGRA) 60 MG tablet Take 180 mg by mouth daily.         No current facility-administered medications for this visit.    OBJECTIVE: Middle-aged white male in no acute distress Filed Vitals:   07/16/13 0919  BP: 109/69  Pulse: 65  Temp: 97.6 F (36.4 C)  Resp: 18     Body mass index is 26.5 kg/(m^2).    ECOG FS: 0 Filed Weights   07/16/13 0919  Weight: 179 lb 8 oz (81.421 kg)   Physical Exam: HEENT:  Sclerae anicteric.  Oropharynx clear. Buccal mucosa is pink and moist. Mucositis the right nares is erythematous, but with no obvious drainage, pustules, or vesicles  noted. NODES:  No cervical or supraclavicular lymphadenopathy palpated. In the lower left axillary region there is a palpable lymph node approximately 1-1.5 cm in size, soft and movable. No additional axillary lymphadenopathy is palpated. LUNGS:  Clear to auscultation bilaterally.  No wheezes or rhonchi HEART:  Regular rate and rhythm. No murmur  ABDOMEN:  Soft, nontender.  Positive bowel sounds.  MSK:  No focal spinal tenderness to palpation. Full range of motion in the upper and lower extremities. EXTREMITIES:  No peripheral edema.   SKIN:  Skin is extremely dry and flaky, especially on the trunk and greater on the back. No obvious rashes are noted. NEURO:  Nonfocal. Well oriented.  Positive affect.   LAB RESULTS:   M-Spike, % g/dL 1.47  82/95/6213    0.86  06/03/2013    0.38  05/08/2013    0.31  04/11/2013    0.32  03/25/2013   Kappa Free Light Chains 2.65 07/11/2013     2.73 06/03/2013     3.51 05/08/2013     2.55 04/11/2013     2.51 03/25/2013   Lambda Free Light Chains 3.23 07/11/2013     3.49 06/03/2013     3.69 05/08/2013     2.55 04/11/2013     2.51 03/25/2013    Lab Results  Component Value Date   WBC 3.3* 07/11/2013   NEUTROABS 1.3* 07/11/2013   HGB 11.7* 07/11/2013   HCT 35.6* 07/11/2013   MCV 95.6 07/11/2013   PLT 84* 07/11/2013      Chemistry      Component Value Date/Time   NA 139 07/11/2013 0810   NA 138 06/16/2013 1547   K 3.8 07/11/2013 0810   K 4.7 06/16/2013 1547   CL 106 06/16/2013 1547   CL 107 03/10/2013 1352   CO2 24 07/11/2013 0810   CO2 28 06/16/2013 1547  BUN 19.0 07/11/2013 0810   BUN 13 06/16/2013 1547   CREATININE 1.0 07/11/2013 0810   CREATININE 1.1 06/16/2013 1547      Component Value Date/Time   CALCIUM 8.9 07/11/2013 0810   CALCIUM 8.6 06/16/2013 1547   ALKPHOS 58 07/11/2013 0810   ALKPHOS 62 06/16/2013 1547   AST 49* 07/11/2013 0810   AST 26 06/16/2013 1547   ALT 26 07/11/2013 0810   ALT 23 06/16/2013 1547   BILITOT 0.46  07/11/2013 0810   BILITOT 0.5 06/16/2013 1547      STUDIES: Mr Conlee Sliter Wo Contrast  13-Jun-2013   CLINICAL DATA:  62 year old male with multiple myeloma. History of memory loss and confusion.  EXAM: MRI HEAD WITHOUT AND WITH CONTRAST  TECHNIQUE: Multiplanar, multiecho pulse sequences of the brain and surrounding structures were obtained according to standard protocol without and with intravenous contrast  CONTRAST:  20 mL MultiHance  COMPARISON:  Brain MRI without contrast 05/13/2012, MRI without and with contrast 03/27/2012.  FINDINGS: Interval decreased patchy and periventricular cerebral white matter FLAIR hyperintensity, with signal changes now more resembling those on 03/27/2012. Occasional white matter areas are increased in T2 and FLAIR signal since that July COMPARISON (left periatrial white matter). No associated enhancement or mass effect.  No restricted diffusion to suggest acute infarction. No midline shift, mass effect, evidence of mass lesion, ventriculomegaly, extra-axial collection or acute intracranial hemorrhage. Cervicomedullary junction and pituitary are within normal limits. Major intracranial vascular flow voids are preserved.  No cortical encephalomalacia. Signal in the deep gray matter nuclei and brainstem remains within normal limits.  Heterogeneous bone marrow signal throughout the calvarium appears stable. Associated heterogeneous enhancement is stable. No dural thickening or abnormal dural enhancement. Negative visualized cervical spine.  Visualized orbit soft tissues are within normal limits. Stable mild paranasal sinus mucosal thickening. Mastoids remain clear. Negative negative scalp soft tissues.  IMPRESSION: 1. Regressed nonspecific cerebral white matter nonenhancing signal changes since 05/13/2012, mildly increased relative to the 03/27/2012 study. No new or acute intracranial abnormality.  2. Stable heterogeneous marrow signal throughout the calvarium. Negative visualized  skullbase and upper cervical spine.   Electronically Signed   By: Augusto Gamble M.D.   On: Jun 13, 2013 09:35   ASSESSMENT: 63 y.o.  [place] man with a history of multiple myeloma diagnosed February of 2012, with an initial M-spike of 2.6 g/dL, IFE showing an IgG lambda paraprotein and free lambda light chains in the urine. Cytogenetics showed trisomy 67. Bone marrow biopsy showed a 22% plasmacytosis. Treated with   (1) bortezomib (subcutaneously), lenalidomide, and dexamethasone, with repeat bone marrow biopsy May of 2012 showing 10% plasmacytosis   (2) high-dose chemotherapy with BCNU and melphalan at [facility], followed by stem cell rescue July of 2012   (3) on zoledronic acid started December of 2012, initially monthly, currently given every 3 months, most recent dose 01/28/2013  (4) low-dose lenalidomide resumed April 2013, interrupted several times since.  Resumed again on 02/19/2013, and a dose of 5 mg daily, 21 days on and 7 days off.  (5) CNS symptoms and abnormal brain MRI extensively evaluated by neurology with no definitive diagnosis established; improving   (6) rising M spike noted June 2014 but did not meet criteria for carfilzomib study  (7) on lenalidomide 25 mg daily, 14 days on, 7 days off, starting 04/18/2013   PLAN:  This case was reviewed with Dr. Darnelle Catalan, who also reviewed Andre [name]'s labs today. He appears to be very stable, and is tolerating the  lenolidomide very well at the current dose. Accordingly, we are making no changes to his current regimen, and he will continue on 25 mg daily, 14 days on and 7 days off.  The abnormality in the left axilla is consistent with a reactive or inflammatory lymph node, and Andre [name] will stop back in one week for brief followup simply to make sure that the area is improving. Dr. Darnelle Catalan also examined this area today.   Otherwise, Andre [name] is scheduled to see Dr. Barbaraann Boys on November 19. He'll return to see Dr. Darnelle Catalan in early December to review his  treatment plan. In the meanwhile, he knows to call with any changes or problems.  Of note, I have called in a prescription for Bactroban ointment to use in the nostrils, applying ointment in both nostrils twice daily for 5 days for an apparent cellulitis. Andre [name] will let us know if this does not take care of the problem.  Antwon Rochin PA-C    07/16/2013

## 2013-07-16 NOTE — Telephone Encounter (Signed)
, °

## 2013-07-28 ENCOUNTER — Other Ambulatory Visit: Payer: Self-pay | Admitting: Oncology

## 2013-07-28 DIAGNOSIS — C9000 Multiple myeloma not having achieved remission: Secondary | ICD-10-CM

## 2013-07-29 ENCOUNTER — Ambulatory Visit (HOSPITAL_BASED_OUTPATIENT_CLINIC_OR_DEPARTMENT_OTHER): Payer: Commercial Managed Care - PPO | Admitting: Oncology

## 2013-07-29 ENCOUNTER — Other Ambulatory Visit (HOSPITAL_BASED_OUTPATIENT_CLINIC_OR_DEPARTMENT_OTHER): Payer: Commercial Managed Care - PPO

## 2013-07-29 VITALS — BP 107/70 | HR 69 | Temp 98.0°F | Resp 20 | Ht 69.0 in | Wt 182.6 lb

## 2013-07-29 DIAGNOSIS — L989 Disorder of the skin and subcutaneous tissue, unspecified: Secondary | ICD-10-CM | POA: Insufficient documentation

## 2013-07-29 DIAGNOSIS — C9000 Multiple myeloma not having achieved remission: Secondary | ICD-10-CM

## 2013-07-29 LAB — CBC WITH DIFFERENTIAL/PLATELET
BASO%: 0.8 % (ref 0.0–2.0)
Basophils Absolute: 0 10*3/uL (ref 0.0–0.1)
EOS%: 19.1 % — ABNORMAL HIGH (ref 0.0–7.0)
HCT: 32.1 % — ABNORMAL LOW (ref 38.4–49.9)
HGB: 10.5 g/dL — ABNORMAL LOW (ref 13.0–17.1)
MCH: 31.5 pg (ref 27.2–33.4)
MCHC: 32.7 g/dL (ref 32.0–36.0)
MCV: 96.3 fL (ref 79.3–98.0)
MONO#: 0.3 10*3/uL (ref 0.1–0.9)
MONO%: 14.2 % — ABNORMAL HIGH (ref 0.0–14.0)
RBC: 3.33 10*6/uL — ABNORMAL LOW (ref 4.20–5.82)
RDW: 20.6 % — ABNORMAL HIGH (ref 11.0–14.6)
WBC: 2.4 10*3/uL — ABNORMAL LOW (ref 4.0–10.3)

## 2013-07-29 LAB — COMPREHENSIVE METABOLIC PANEL (CC13)
ALT: 23 U/L (ref 0–55)
AST: 19 U/L (ref 5–34)
Albumin: 3.1 g/dL — ABNORMAL LOW (ref 3.5–5.0)
Alkaline Phosphatase: 73 U/L (ref 40–150)
Anion Gap: 9 mEq/L (ref 3–11)
BUN: 14.9 mg/dL (ref 7.0–26.0)
CO2: 23 mEq/L (ref 22–29)
Potassium: 3.8 mEq/L (ref 3.5–5.1)
Sodium: 139 mEq/L (ref 136–145)
Total Protein: 6.3 g/dL — ABNORMAL LOW (ref 6.4–8.3)

## 2013-07-29 NOTE — Progress Notes (Signed)
ID: Andre [name]   DOB: December 11, 1950  MR#: [id number]  CSN#:[id number]   PCP: Sonda Primes SU: OTHER MD: Lesia Sago, Eddie Candle, Peyton Najjar  CHIEF COMPLAINT: Multiple myeloma    HISTORY OF PRESENT ILLNESS: The patient was worked up for peptic ulcer disease in August of 2011, with significant bleeding and anemia.  The patient was Helicobacter pylori negative.  He received some epinephrine when he had his EGD and then started on Protonix.  [name] anemia slowly resolved so that by September, his hemoglobin was up to 10.9 and by earlier this month, his hemoglobin was up to 12.5.    As part of his general workup, he was found to have a slightly elevated globulin fraction.  In September, his total protein was 8.3 with an albumin of 3.8.  In January, the total protein was 8.4 and albumin 3.6.  With persistence of this slight abnormality, Dr. Juanda Chance obtained serum immunofixation and SPEP.  The SPTP showed an M-spike of 2.67 grams.  A total IgG was 4,190.  Total IgA low at 28.  Total IgM low at 28 also.  The immunofixation showed a monoclonal IgG lambda paraprotein.  There were also monoclonal free lambda light chains present. With this information, the patient was referred for further evaluation.   A diagnosis of myeloma was confirmed by bone marrow biopsy and subsequebnt treatment is as detailed below  INTERVAL HISTORY: Andre [name] returns today for an unscheduled visit. He has noted a "bump" at the base of his neck on the left that he wanted [place] to look at. It is causing him some pain. He is taking Motrin and that makes it feel better. It has been there approximately 1 week.   REVIEW OF SYSTEMS: Andre [name] has had no fever, bleeding, or shortness of breath, cough, phlegm production or pleurisy. There has been no change in bowel or bladder habits. He has no pain. He continues to exercise vigorously, although he complains of feeling tired. He is working full-time. He developed a  "total body rash", which is hard to see and is associated with dry skin. He is using some all be MultiHance with some success as far as that is concerned. He is tolerating the lenalidomide without significant constipation, somnolence, or other side effects. A detailed review of systems was otherwise stable   PAST MEDICAL HISTORY: Past Medical History  Diagnosis Date  . Hypertension   . Hyperlipidemia   . Duodenal ulcer   . Multiple myeloma 07/04/2011  . Allergy   . Thyroid disease   Significant for peptic ulcer disease as noted above.  History of hyperlipidemia.  History of anxiety and depression.  History of GERD which is significantly improved with weight loss and history of reactive airway disease, possibly secondary to the GERD which also improved with weight loss.  There was a history of obesity, now much improved secondary to exercise and diet.   PAST SURGICAL HISTORY: Past Surgical History  Procedure Laterality Date  . Cardiolite study  11/25/2003    NORMAL  . Bone marrow transplant  2011    for MM  . Tonsillectomy      FAMILY HISTORY Family History  Problem Relation Age of Onset  . Cancer Mother   . Hypertension Father   . Stroke Father   . Asthma Father   . Diabetes Father   [name] father died at the age of 48 following a stroke.  [name] mother died with cancer of the throat at the  age of 62.   She had a history of depression and was a smoker.  Not clear how much alcohol she drank, according to the patient.  The patient had two brothers.  One died from the age of 6 from a "kidney problem."  The second one died at the age of 66 from pancreatic cancer. (This may have been duodenal cancer. 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  His wife of 37+ years was [name], but  they are now divorced.   The patient has two daughters:  Andre [name] who is 40, and Andre [name] who is 5.  They both live here in [place]. 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  The patient is very close to his daughters.  Parson has one daughter, Andre [name], born 03/06/2013.  The patient is not a church attender.  He does derive a great deal of support from friends at the "[employer]" which he attends very regularly, and also participates in Al-Anon even though actually there is no alcohol or drug problem in the family.  He participates in this group and he gets quite a bit of support from it.      ADVANCED DIRECTIVES: In place  HEALTH MAINTENANCE: (updated 07/16/2013) History  Substance Use Topics  . Smoking status: Former Smoker -- 3 years    Quit date: 03/27/1969  . Smokeless tobacco: Never Used  . Alcohol Use: No     Colonoscopy:  PSA:  Sept 2014, normal/Dr. [name]  Lipid panel: Sept 2014/Dr. [name]   Allergies  Allergen Reactions  . Crestor [Rosuvastatin Calcium]     ADVERSE EFFECTS ON LIVER  . Lipitor [Atorvastatin Calcium]     Current Outpatient Prescriptions  Medication Sig Dispense Refill  . aspirin EC 81 MG tablet Take 81 mg by mouth every morning.      . Bisacodyl (DULCOLAX PO) Take 1 tablet by mouth.      . cholecalciferol (VITAMIN D) 1000 UNITS tablet Take 1,000 Units by mouth every morning.      . diphenhydramine-acetaminophen (TYLENOL PM) 25-500 MG TABS Take 1 tablet by mouth at bedtime.       [name] escitalopram (LEXAPRO) 10 MG tablet Take 10 mg by mouth at bedtime.      [name] ezetimibe (ZETIA) 10 MG tablet Take 10 mg by mouth every morning.      [name] ibuprofen (ADVIL,MOTRIN) 200 MG tablet Take 400 mg by mouth every 8 (eight) hours as needed for pain.       [name] lenalidomide (REVLIMID) 25 MG capsule Take one tablet daily for 14 days, then off 7 days, then repeat auth # 06/16/2013 [id number]  21 capsule  0  . levothyroxine (SYNTHROID, LEVOTHROID) 150 MCG tablet Take 1 tablet (150 mcg  total) by mouth daily.  90 tablet  3  . loratadine (CLARITIN) 10 MG tablet Take 10 mg by mouth every morning.      . mupirocin ointment (BACTROBAN) 2 % Place small amount into each nostril twice daily x 5 days.  After application, press sides of nose together and gently massage.  22 g  0  . omeprazole (PRILOSEC) 40 MG capsule Take 1 capsule (40 mg total) by mouth every morning.  30 capsule  4  . polyethylene glycol (MIRALAX / GLYCOLAX) packet Take 17 g by mouth daily.      [name]  predniSONE (DELTASONE) 20 MG tablet Take 40 mg by mouth daily.      . [DISCONTINUED] fexofenadine (ALLEGRA) 60 MG tablet Take 180 mg by mouth daily.         No current facility-administered medications for this visit.    OBJECTIVE: Middle-aged white male in no acute distress Filed Vitals:   07/29/13 1319  BP: 107/70  Pulse: 69  Temp: 98 F (36.7 C)  Resp: 20     Body mass index is 26.95 kg/(m^2).    ECOG FS: 0 Filed Weights   07/29/13 1319  Weight: 182 lb 9.6 oz (82.827 kg)   Physical Exam: Pupils were equal round and reactive to light  Oropharynx was clear There is no cervical or supraclavicular adenopathy.  At the base of the neck to the left, posteriorly, there is a flat moderately firm rounded lesion consistent with a cyst. It is not fluctuant, erythematous, or tender to palpation. It is minimally movable. There is no obvious skin breakdown, bite mark, or folliculitis. Heart regular rate and rhythm Abdomen soft nontender positive bowel sounds Minimal bilateral lower extremity edema Neuro is nonfocal, well-oriented, would depressed affect  LAB RESULTS:   M-Spike, % g/dL 1.61  09/60/4540    9.81  06/03/2013    0.38  05/08/2013    0.31  04/11/2013    0.32  03/25/2013   Kappa Free Light Chains 2.65 07/11/2013     2.73 06/03/2013     3.51 05/08/2013     2.55 04/11/2013     2.51 03/25/2013   Lambda Free Light  Chains 3.23 07/11/2013     3.49 06/03/2013     3.69 05/08/2013     2.55 04/11/2013     2.51 03/25/2013    Lab Results  Component Value Date   WBC 2.4* 07/29/2013   NEUTROABS 1.0* 07/29/2013   HGB 10.5* 07/29/2013   HCT 32.1* 07/29/2013   MCV 96.3 07/29/2013   PLT 91* 07/29/2013      Chemistry      Component Value Date/Time   NA 139 07/11/2013 0810   NA 138 06/16/2013 1547   K 3.8 07/11/2013 0810   K 4.7 06/16/2013 1547   CL 106 06/16/2013 1547   CL 107 03/10/2013 1352   CO2 24 07/11/2013 0810   CO2 28 06/16/2013 1547   BUN 19.0 07/11/2013 0810   BUN 13 06/16/2013 1547   CREATININE 1.0 07/11/2013 0810   CREATININE 1.1 06/16/2013 1547      Component Value Date/Time   CALCIUM 8.9 07/11/2013 0810   CALCIUM 8.6 06/16/2013 1547   ALKPHOS 58 07/11/2013 0810   ALKPHOS 62 06/16/2013 1547   AST 49* 07/11/2013 0810   AST 26 06/16/2013 1547   ALT 26 07/11/2013 0810   ALT 23 06/16/2013 1547   BILITOT 0.46 07/11/2013 0810   BILITOT 0.5 06/16/2013 1547      STUDIES: No results found.  ASSESSMENT: 62 y.o.  [place] man with a history of multiple myeloma diagnosed February of 2012, with an initial M-spike of 2.6 g/dL, IFE showing an IgG lambda paraprotein and free lambda light chains in the urine. Cytogenetics showed trisomy 33. Bone marrow biopsy showed a 22% plasmacytosis. Treated with   (1) bortezomib (subcutaneously), lenalidomide, and dexamethasone, with repeat bone marrow biopsy May of 2012 showing 10% plasmacytosis   (2) high-dose chemotherapy with BCNU and melphalan at [facility], followed by stem cell rescue July of 2012   (3) on zoledronic acid started December of 2012, initially  monthly, currently given every 3 months, most recent dose 01/28/2013  (4) low-dose lenalidomide resumed April 2013, interrupted several times since.  Resumed again on 02/19/2013, and a dose of 5 mg daily, 21 days on and 7 days off.  (5) CNS symptoms and abnormal brain MRI extensively evaluated by neurology  with no definitive diagnosis established; improving   (6) rising M spike noted June 2014 but did not meet criteria for carfilzomib study  (7) on lenalidomide 25 mg daily, 14 days on, 7 days off, starting 04/18/2013   PLAN:  I'm not quite sure why the bump in the neck that Andre [name] has developed is. It seems to me more like a sebaceous cyst than anything else. However he says it is decreasing. I don't know why it would be causing him any discomfort.   Because he thinks it is smaller, we're going to wait a few days before doing anything about it. However if it is not significantly smaller by next Monday him in November 17, I will send him to a surgeon for biopsy or aspiration (he requests Dr. Ovidio Kin).  Lowella Dell, MD     07/29/2013

## 2013-07-30 ENCOUNTER — Other Ambulatory Visit: Payer: Self-pay | Admitting: *Deleted

## 2013-07-30 DIAGNOSIS — C9000 Multiple myeloma not having achieved remission: Secondary | ICD-10-CM

## 2013-07-30 MED ORDER — LENALIDOMIDE 25 MG PO CAPS: ORAL_CAPSULE | ORAL | Status: DC

## 2013-07-31 LAB — PROTEIN ELECTROPHORESIS, SERUM
Albumin ELP: 54 % — ABNORMAL LOW (ref 55.8–66.1)
Alpha-1-Globulin: 8.4 % — ABNORMAL HIGH (ref 2.9–4.9)
Beta 2: 4.1 % (ref 3.2–6.5)
Beta Globulin: 7.6 % — ABNORMAL HIGH (ref 4.7–7.2)
Gamma Globulin: 14.3 % (ref 11.1–18.8)
M-Spike, %: 0.33 g/dL

## 2013-08-05 ENCOUNTER — Telehealth: Payer: Self-pay | Admitting: *Deleted

## 2013-08-05 DIAGNOSIS — C9000 Multiple myeloma not having achieved remission: Secondary | ICD-10-CM

## 2013-08-05 MED ORDER — AZITHROMYCIN 250 MG PO TABS: ORAL_TABLET | ORAL | Status: DC

## 2013-08-05 NOTE — Telephone Encounter (Signed)
Patient calling in to report that he has had a cold with congestion since last Thursday. Has been unsuccessful with OTC Dayquil and Nyquil. Still running low grade temps of 99-100 and persistant headaches. Per Dr Darnelle Catalan, we will call in for Zpak and patient to continue with OTC supportive care.

## 2013-08-19 ENCOUNTER — Other Ambulatory Visit (HOSPITAL_BASED_OUTPATIENT_CLINIC_OR_DEPARTMENT_OTHER): Payer: Commercial Managed Care - PPO | Admitting: Lab

## 2013-08-19 DIAGNOSIS — C9000 Multiple myeloma not having achieved remission: Secondary | ICD-10-CM

## 2013-08-19 LAB — CBC WITH DIFFERENTIAL/PLATELET
EOS%: 19.9 % — ABNORMAL HIGH (ref 0.0–7.0)
Eosinophils Absolute: 0.8 10*3/uL — ABNORMAL HIGH (ref 0.0–0.5)
HCT: 31.8 % — ABNORMAL LOW (ref 38.4–49.9)
HGB: 10.4 g/dL — ABNORMAL LOW (ref 13.0–17.1)
LYMPH%: 24.7 % (ref 14.0–49.0)
MCH: 32.8 pg (ref 27.2–33.4)
MCV: 100 fL — ABNORMAL HIGH (ref 79.3–98.0)
MONO%: 11.1 % (ref 0.0–14.0)
NEUT%: 43 % (ref 39.0–75.0)
Platelets: 86 10*3/uL — ABNORMAL LOW (ref 140–400)
RBC: 3.18 10*6/uL — ABNORMAL LOW (ref 4.20–5.82)
RDW: 20 % — ABNORMAL HIGH (ref 11.0–14.6)

## 2013-08-19 LAB — PROTEIN / CREATININE RATIO, URINE
Creatinine, Urine: 139.3 mg/dL
Total Protein, Urine: 13 mg/dL

## 2013-08-25 ENCOUNTER — Other Ambulatory Visit: Payer: Self-pay | Admitting: *Deleted

## 2013-08-25 MED ORDER — LENALIDOMIDE 25 MG PO CAPS: 25.0000 mg | ORAL_CAPSULE | Freq: Every day | ORAL | Status: DC

## 2013-08-26 ENCOUNTER — Telehealth: Payer: Self-pay | Admitting: *Deleted

## 2013-08-26 ENCOUNTER — Ambulatory Visit (HOSPITAL_BASED_OUTPATIENT_CLINIC_OR_DEPARTMENT_OTHER): Payer: Commercial Managed Care - PPO | Admitting: Oncology

## 2013-08-26 ENCOUNTER — Ambulatory Visit (HOSPITAL_BASED_OUTPATIENT_CLINIC_OR_DEPARTMENT_OTHER): Payer: Commercial Managed Care - PPO | Admitting: Lab

## 2013-08-26 VITALS — BP 103/67 | HR 61 | Temp 98.7°F | Resp 18 | Ht 69.0 in | Wt 182.3 lb

## 2013-08-26 DIAGNOSIS — C9000 Multiple myeloma not having achieved remission: Secondary | ICD-10-CM

## 2013-08-26 DIAGNOSIS — R9409 Abnormal results of other function studies of central nervous system: Secondary | ICD-10-CM

## 2013-08-26 LAB — CBC WITH DIFFERENTIAL/PLATELET
BASO%: 0.9 % (ref 0.0–2.0)
EOS%: 28.2 % — ABNORMAL HIGH (ref 0.0–7.0)
HCT: 29.9 % — ABNORMAL LOW (ref 38.4–49.9)
MCH: 33.9 pg — ABNORMAL HIGH (ref 27.2–33.4)
MCHC: 34.2 g/dL (ref 32.0–36.0)
MCV: 99 fL — ABNORMAL HIGH (ref 79.3–98.0)
MONO#: 0.4 10*3/uL (ref 0.1–0.9)
MONO%: 13.6 % (ref 0.0–14.0)
NEUT%: 29.8 % — ABNORMAL LOW (ref 39.0–75.0)
RDW: 19.9 % — ABNORMAL HIGH (ref 11.0–14.6)
WBC: 2.8 10*3/uL — ABNORMAL LOW (ref 4.0–10.3)
lymph#: 0.8 10*3/uL — ABNORMAL LOW (ref 0.9–3.3)

## 2013-08-26 LAB — COMPREHENSIVE METABOLIC PANEL (CC13)
ALT: 20 U/L (ref 0–55)
AST: 20 U/L (ref 5–34)
Albumin: 3.1 g/dL — ABNORMAL LOW (ref 3.5–5.0)
CO2: 23 mEq/L (ref 22–29)
Calcium: 8.8 mg/dL (ref 8.4–10.4)
Chloride: 106 mEq/L (ref 98–109)
Creatinine: 1 mg/dL (ref 0.7–1.3)
Potassium: 4.5 mEq/L (ref 3.5–5.1)
Sodium: 138 mEq/L (ref 136–145)
Total Protein: 6.3 g/dL — ABNORMAL LOW (ref 6.4–8.3)

## 2013-08-26 NOTE — Telephone Encounter (Signed)
sw pt gv appts for labs on 09/23/13@ 8am and 10/21/13 @ 8am. i also gv appt for labs@ 11:30am and ov@ 12 noon for 10/29/13. Pt is aware...td

## 2013-08-26 NOTE — Telephone Encounter (Signed)
Sent pt back to labs today. Pt is aware that i have to send an email to [facility] for a time slot. Pt is aware that i will call w/ labs for the future...td

## 2013-08-26 NOTE — Progress Notes (Signed)
ID: [name]   DOB: 1951/07/23  MR#: [id number]  WUJ#:[id number]   PCP: Andre [name] SU: OTHER MD: Andre [name], Andre [name], Andre [name]  CHIEF COMPLAINT: Multiple myeloma   HISTORY OF PRESENT ILLNESS: The patient was worked up for peptic ulcer disease in August of 2011, with significant bleeding and anemia.  The patient was Helicobacter pylori negative.  He received some epinephrine when he had his EGD and then started on Protonix.  [name] anemia slowly resolved so that by September, his hemoglobin was up to 10.9 and by earlier this month, his hemoglobin was up to 12.5.    As part of his general workup, he was found to have a slightly elevated globulin fraction.  In September, his total protein was 8.3 with an albumin of 3.8.  In January, the total protein was 8.4 and albumin 3.6.  With persistence of this slight abnormality, Dr. Juanda [name] obtained serum immunofixation and SPEP.  The SPTP showed an M-spike of 2.67 grams.  A total IgG was 4,190.  Total IgA low at 28.  Total IgM low at 28 also.  The immunofixation showed a monoclonal IgG lambda paraprotein.  There were also monoclonal free lambda light chains present. With this information, the patient was referred for further evaluation.   A diagnosis of myeloma was confirmed by bone marrow biopsy and subsequebnt treatment is as detailed below  INTERVAL HISTORY: Andre [name] returns today for followup of his multiple myeloma. Since his last visit here he took a trip by himself to the [facility], which she enjoyed. While there he ran a half marathon.   REVIEW OF SYSTEMS: Andre [name] called [place] a couple weeks ago complaining of an upper respiratory infection that would not clear. He was producing yellow phlegm, although temperatures were mild. We wrote him for a Z-Pak and had cleared it. Also the "bump" on the back of his neck has resolved without any other intervention. He denies constipation or peripheral neuropathy  problems. He had a nice time over Thanksgiving's particularly of visiting with this baby grandchild he developed a cramp after her 12 miles a running, left flank, which cleared with hydration and a little bit of massage. He was able to finish the race. A detailed review of systems was otherwise noncontributory  PAST MEDICAL HISTORY: Past Medical History  Diagnosis Date  . Hypertension   . Hyperlipidemia   . Duodenal ulcer   . Multiple myeloma 07/04/2011  . Allergy   . Thyroid disease   Significant for peptic ulcer disease as noted above.  History of hyperlipidemia.  History of anxiety and depression.  History of GERD which is significantly improved with weight loss and history of reactive airway disease, possibly secondary to the GERD which also improved with weight loss.  There was a history of obesity, now much improved secondary to exercise and diet.   PAST SURGICAL HISTORY: Past Surgical History  Procedure Laterality Date  . Cardiolite study  11/25/2003    NORMAL  . Bone marrow transplant  2011    for MM  . Tonsillectomy      FAMILY HISTORY Family History  Problem Relation Age of Onset  . Cancer Mother   . Hypertension Father   . Stroke Father   . Asthma Father   . Diabetes Father   [name] father died at the age of 90 following a stroke.  [name] mother died with cancer of the throat at the age of 54.   She had a  history of depression and was a smoker.  Not clear how much alcohol she drank, according to the patient.  The patient had two brothers.  One died from the age of 6 from a "kidney problem."  The second one died at the age of 49 from pancreatic cancer. (This may have been duodenal cancer. 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  His wife of 37+ years was Andre [name], but  they are now divorced.   The patient has two daughters:  Andre [name] who is 17, and Andre [name] who is 20.  They both live here in [place]. 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  The patient is very close to his daughters.  Andre [name] has one daughter, Andre [name], born 03/06/2013.  The patient is not a church attender.  He does derive a great deal of support from friends at the "[employer]" which he attends very regularly, and also participates in Al-Anon even though actually there is no alcohol or drug problem in the family.  He participates in this group and he gets quite a bit of support from it.      ADVANCED DIRECTIVES: In place  HEALTH MAINTENANCE: (updated 07/16/2013) History  Substance Use Topics  . Smoking status: Former Smoker -- 3 years    Quit date: 03/27/1969  . Smokeless tobacco: Never Used  . Alcohol Use: No     Colonoscopy:  PSA:  Sept 2014, normal/Dr. [name]  Lipid panel: Sept 2014/Dr. [name]   Allergies  Allergen Reactions  . Crestor [Rosuvastatin Calcium]     ADVERSE EFFECTS ON LIVER  . Lipitor [Atorvastatin Calcium]     Current Outpatient Prescriptions  Medication Sig Dispense Refill  . aspirin EC 81 MG tablet Take 81 mg by mouth every morning.      [name] azithromycin (ZITHROMAX) 250 MG tablet Take as directed  6 each  0  . Bisacodyl (DULCOLAX PO) Take 1 tablet by mouth.      . cholecalciferol (VITAMIN D) 1000 UNITS tablet Take 1,000 Units by mouth every morning.      . diphenhydramine-acetaminophen (TYLENOL PM) 25-500 MG TABS Take 1 tablet by mouth at bedtime.       [name] escitalopram (LEXAPRO) 10 MG tablet Take 10 mg by mouth at bedtime.      [name] ezetimibe (ZETIA) 10 MG tablet Take 10 mg by mouth every morning.      [name] ibuprofen (ADVIL,MOTRIN) 200 MG tablet Take 400 mg by mouth every 8 (eight) hours as needed for pain.       [name] lenalidomide (REVLIMID) 25 MG capsule Take 1 capsule (25 mg total) by mouth daily. Take one tablet daily for 21 days, then off 7 days, repeat q28days Berkley Harvey  #[id number] on 08/25/2013 Faxed to [facility] at [phone]  21 capsule  0  . levothyroxine (SYNTHROID, LEVOTHROID) 150 MCG tablet Take 1 tablet (150 mcg total) by mouth daily.  90 tablet  3  . loratadine (CLARITIN) 10 MG tablet Take 10 mg by mouth every morning.      . mupirocin ointment (BACTROBAN) 2 % Place small amount into each nostril twice daily x 5 days.  After application, press sides of nose together and gently massage.  22 g  0  . omeprazole (PRILOSEC) 40 MG capsule Take 1 capsule (40 mg total) by mouth  every morning.  30 capsule  4  . polyethylene glycol (MIRALAX / GLYCOLAX) packet Take 17 g by mouth daily.      . predniSONE (DELTASONE) 20 MG tablet Take 40 mg by mouth daily.      . [DISCONTINUED] fexofenadine (ALLEGRA) 60 MG tablet Take 180 mg by mouth daily.         No current facility-administered medications for this visit.    OBJECTIVE: Middle-aged white man who appears well Filed Vitals:   08/26/13 0919  BP: 103/67  Pulse: 61  Temp: 98.7 F (37.1 C)  Resp: 18     Body mass index is 26.91 kg/(m^2).    ECOG FS: 0 Filed Weights   08/26/13 0919  Weight: 182 lb 4.8 oz (82.691 kg)   Physical Exam: Pupils were equal and round Oropharynx no thrush or other lesions There is no cervical or supraclavicular adenopathy. What was probably a blocked pore at the back of his neck has resolved. Heart regular rate and rhythm Abdomen soft, nontender, positive bowel sounds No peripheral edema Neuro is nonfocal, well-oriented, appropriate affect  LAB RESULTS:   M-Spike, % g/dL 1.61  09/60/4540    9.81  06/03/2013    0.38  05/08/2013    0.31  04/11/2013    0.32  03/25/2013   Kappa Free Light Chains 2.65 07/11/2013     2.73 06/03/2013     3.51 05/08/2013     2.55 04/11/2013     2.51 03/25/2013   Lambda Free Light Chains 3.23 07/11/2013     3.49 06/03/2013     3.69 05/08/2013     2.55 04/11/2013     2.51 03/25/2013    Lab Results  Component Value Date   WBC 3.9*  08/19/2013   NEUTROABS 1.7 08/19/2013   HGB 10.4* 08/19/2013   HCT 31.8* 08/19/2013   MCV 100.0* 08/19/2013   PLT 86* 08/19/2013      Chemistry      Component Value Date/Time   NA 139 07/29/2013 1259   NA 138 06/16/2013 1547   K 3.8 07/29/2013 1259   K 4.7 06/16/2013 1547   CL 106 06/16/2013 1547   CL 107 03/10/2013 1352   CO2 23 07/29/2013 1259   CO2 28 06/16/2013 1547   BUN 14.9 07/29/2013 1259   BUN 13 06/16/2013 1547   CREATININE 0.9 07/29/2013 1259   CREATININE 1.1 06/16/2013 1547      Component Value Date/Time   CALCIUM 9.1 07/29/2013 1259   CALCIUM 8.6 06/16/2013 1547   ALKPHOS 73 07/29/2013 1259   ALKPHOS 62 06/16/2013 1547   AST 19 07/29/2013 1259   AST 26 06/16/2013 1547   ALT 23 07/29/2013 1259   ALT 23 06/16/2013 1547   BILITOT 0.30 07/29/2013 1259   BILITOT 0.5 06/16/2013 1547      STUDIES: No results found.  ASSESSMENT: 62 y.o.  [place] man with a history of multiple myeloma diagnosed February of 2012, with an initial M-spike of 2.6 g/dL, IFE showing an IgG lambda paraprotein and free lambda light chains in the urine. Cytogenetics showed trisomy 30. Bone marrow biopsy showed a 22% plasmacytosis. Treated with   (1) bortezomib (subcutaneously), lenalidomide, and dexamethasone, with repeat bone marrow biopsy May of 2012 showing 10% plasmacytosis   (2) high-dose chemotherapy with BCNU and melphalan at [facility], followed by stem cell rescue July of 2012   (3) on zoledronic acid started December of 2012, initially monthly, currently given every 3 months, most recent dose 01/28/2013  (  4) low-dose lenalidomide resumed April 2013, interrupted several times since.  Resumed again on 02/19/2013, and a dose of 5 mg daily, 21 days on and 7 days off.  (5) CNS symptoms and abnormal brain MRI extensively evaluated by neurology with no definitive diagnosis established; improving   (6) rising M spike noted June 2014 but did not meet criteria for carfilzomib study  (7) on lenalidomide 25  mg daily, 14 days on, 7 days off, starting 04/18/2013   PLAN:  We have scheduled Andre [name] for every 4 week as in light chains but for some reason that has not been done. I discussed this with the lab. I sent him back to get those labs drawn today. He will also have a TSH today.  I think it would be better if he had his labs drawn a few days before the visit and then there would be little question as to which to draw. Accordingly his next set of lab work will be January 6. He will see Dr. Barbaraann Boys at [facility] January 15. Her labs he will be February 3 and then he'll see me February 10.  At this point it appears that Andre [name] is doing well as far as his myeloma is concerned. He is tolerating the lenalidomide well. The plan is to continue on a 14 days on, 7 days off pattern indefinitely or until disease progression. He knows to call for any problems that may develop before his next visit here.  Lowella Dell, MD     08/26/2013

## 2013-08-28 LAB — SPEP & IFE WITH QIG
Albumin ELP: 57 % (ref 55.8–66.1)
Alpha-1-Globulin: 5.4 % — ABNORMAL HIGH (ref 2.9–4.9)
Alpha-2-Globulin: 10.5 % (ref 7.1–11.8)
Beta 2: 4.5 % (ref 3.2–6.5)
Gamma Globulin: 15.3 % (ref 11.1–18.8)
IgA: 240 mg/dL (ref 68–379)
IgG (Immunoglobin G), Serum: 1000 mg/dL (ref 650–1600)
IgM, Serum: 48 mg/dL (ref 41–251)

## 2013-08-28 LAB — KAPPA/LAMBDA LIGHT CHAINS
Kappa:Lambda Ratio: 1.01 (ref 0.26–1.65)
Lambda Free Lght Chn: 5.13 mg/dL — ABNORMAL HIGH (ref 0.57–2.63)

## 2013-08-29 ENCOUNTER — Other Ambulatory Visit: Payer: Self-pay | Admitting: *Deleted

## 2013-09-03 ENCOUNTER — Ambulatory Visit (HOSPITAL_BASED_OUTPATIENT_CLINIC_OR_DEPARTMENT_OTHER): Payer: Commercial Managed Care - PPO

## 2013-09-03 ENCOUNTER — Telehealth: Payer: Self-pay | Admitting: *Deleted

## 2013-09-03 ENCOUNTER — Other Ambulatory Visit: Payer: Self-pay | Admitting: *Deleted

## 2013-09-03 ENCOUNTER — Ambulatory Visit (HOSPITAL_COMMUNITY)
Admission: RE | Admit: 2013-09-03 | Discharge: 2013-09-03 | Disposition: A | Discharge status: Home / Self Care | Payer: Commercial Managed Care - PPO | Source: Ambulatory Visit | Attending: Physician Assistant | Admitting: Physician Assistant

## 2013-09-03 DIAGNOSIS — C9000 Multiple myeloma not having achieved remission: Secondary | ICD-10-CM

## 2013-09-03 DIAGNOSIS — D649 Anemia, unspecified: Secondary | ICD-10-CM

## 2013-09-03 DIAGNOSIS — R079 Chest pain, unspecified: Secondary | ICD-10-CM | POA: Insufficient documentation

## 2013-09-03 LAB — CBC WITH DIFFERENTIAL/PLATELET
BASO%: 2.7 % — ABNORMAL HIGH (ref 0.0–2.0)
Basophils Absolute: 0.1 10*3/uL (ref 0.0–0.1)
EOS%: 14.2 % — ABNORMAL HIGH (ref 0.0–7.0)
HCT: 28.7 % — ABNORMAL LOW (ref 38.4–49.9)
HGB: 9.8 g/dL — ABNORMAL LOW (ref 13.0–17.1)
LYMPH%: 31.5 % (ref 14.0–49.0)
MCH: 34.9 pg — ABNORMAL HIGH (ref 27.2–33.4)
MCHC: 34.3 g/dL (ref 32.0–36.0)
MONO%: 16.5 % — ABNORMAL HIGH (ref 0.0–14.0)
NEUT%: 35.1 % — ABNORMAL LOW (ref 39.0–75.0)
Platelets: 80 10*3/uL — ABNORMAL LOW (ref 140–400)

## 2013-09-03 NOTE — Telephone Encounter (Signed)
Patient came in for labs/xray, discussed with Zollie Scale, PA, no obvious/acute injuries noted on xray, labs stable. Patient can continue to take Ibuprofen for pain and will call us if pain worsens.

## 2013-09-03 NOTE — Telephone Encounter (Signed)
Patient calling in to report that he fell Monday and still has same amount of pain in ribs, has been taking Ibuprofen, but not helping much. Spoke with Zollie Scale, PA, patient should come in for CBC to check platelets and have a CXR. Patient states he can be here within the hour. He is to go to [facility] first then come for labs and wait for results.

## 2013-09-15 ENCOUNTER — Telehealth: Payer: Self-pay | Admitting: *Deleted

## 2013-09-15 ENCOUNTER — Telehealth: Payer: Self-pay | Admitting: Internal Medicine

## 2013-09-15 ENCOUNTER — Ambulatory Visit (INDEPENDENT_AMBULATORY_CARE_PROVIDER_SITE_OTHER): Payer: Commercial Managed Care - PPO | Admitting: Gastroenterology

## 2013-09-15 ENCOUNTER — Encounter: Payer: Self-pay | Admitting: Gastroenterology

## 2013-09-15 ENCOUNTER — Other Ambulatory Visit: Payer: Self-pay | Admitting: *Deleted

## 2013-09-15 VITALS — BP 90/70 | HR 84 | Ht 69.5 in | Wt 178.4 lb

## 2013-09-15 DIAGNOSIS — C9000 Multiple myeloma not having achieved remission: Secondary | ICD-10-CM

## 2013-09-15 DIAGNOSIS — K625 Hemorrhage of anus and rectum: Secondary | ICD-10-CM | POA: Insufficient documentation

## 2013-09-15 HISTORY — DX: Hemorrhage of anus and rectum: K62.5

## 2013-09-15 MED ORDER — MOVIPREP 100 G PO SOLR: 1.0000 | ORAL | Status: DC

## 2013-09-15 MED ORDER — OMEPRAZOLE 40 MG PO CPDR: 40.0000 mg | DELAYED_RELEASE_CAPSULE | Freq: Every morning | ORAL | Status: DC

## 2013-09-15 MED ORDER — HYDROCORTISONE ACETATE 25 MG RE SUPP: 25.0000 mg | Freq: Two times a day (BID) | RECTAL | Status: DC

## 2013-09-15 NOTE — Telephone Encounter (Signed)
Augusto Garbe, RN at 09/15/2013 8:59 AM     Status: Signed        Patient called reporting rectal bleeding. "I've called GI, Dr./ Juanda Chance and haven't heard back yet. I've had this happen before and had an ulcer problem. Blood on tissue and on the stool. None in the water. I've been taking ibuprofen for pain in my rib area. Blood is not random it's when I have a bowel movement." Asked if constipated and says I have been before. This nurse instructed to go to ER for evaluation. Wants to wait a little longer for a return call from Dr. Regino Schultze office. This nurse suggested he try something new for pain that does not affect or thin blood and to drink more water or stool softeners to not strain with bowel movements. Will notify providers. Next scheduled f/u here is 10-29-2013   The above note was from the Triage Nurse at the [facility] where pt is being tx with Revlimid for Multiple Myleoma.  Pt reports BRB on toilet tissue and in the stool, not the bowl, for 2-3 days. He is not sure if he has hemorrhoids; only COLON was done in [place] and I did not look for the report. He states he fell and has pain in his rib area and has been taking a lot of Ibuprofen. He has a hx of bleeding ulcers in 2011. Pt will see Doug Sou, PA this am.

## 2013-09-15 NOTE — Progress Notes (Signed)
09/15/2013 Andre [name] [id number] 12/13/1950   HISTORY OF PRESENT ILLNESS:  This is a pleasant 62 year old male who is a patient of Dr. Regino Schultze.  He was seen by our physicians last in 2011 at which time he was hospitalized for an UGIB.  He underwent EGD by Dr. Arlyce Dice at that time on 05/03/2010 and was found to have multiple ulcers in the bulb of the duodenum that were injected with epi for hemostasis.  He also had a Schatzki's ring at the GE junction and a hiatal hernia.  Biopsies showed chronic duodenitis C/W peptic duodenitis.  Since that time he has been diagnosed with multiple myeloma and has been undergoing treatment for that.    He comes in today with complaints of rectal bleeding.  He says that for the past few days he has been passing blood with his stools; says that the blood is mixed in with the stools.  No black stools.  No abdominal pain.  Tends to have constipation but uses Miralax and dulcolax for that issue.  Today it was the most that he had seen.  Last colonoscopy was in 10/2005 in [place] at which time he had moderate diverticulosis and modest hemorrhoids noted.  Labs are checked regularly by oncology.  Hgb last week was 9.8 grams and 3 months ago his Hgb was 12.3 grams, but obviously labs are always off with this multiple myeloma so he says that his oncologist did not seem concerned about the drop.       He was taking ibuprofen regularly for about a week due to pain on his right side in his ribs from a fall that he had.  Is no longer taking that.  He takes daily omeprazole as well.  He also complains of a rash on his back and circulation problems in his fingers.  Says that he saw dermatology about the rash before, but it is in different areas now.        Past Medical History  Diagnosis Date  . Hypertension   . Hyperlipidemia   . Duodenal ulcer   . Multiple myeloma 07/04/2011  . Allergy   . Thyroid disease   . GERD (gastroesophageal reflux disease)   . Depression   . Anemia     Past Surgical History  Procedure Laterality Date  . Cardiolite study  11/25/2003    NORMAL  . Bone marrow transplant  2011    for MM  . Tonsillectomy      reports that he quit smoking about 44 years ago. He has never used smokeless tobacco. He reports that he does not drink alcohol or use illicit drugs. family history includes Asthma in his father; Diabetes in his father; Hypertension in his father; Pancreatic cancer in his brother; Stroke in his father; Throat cancer in his mother. Allergies  Allergen Reactions  . Crestor [Rosuvastatin Calcium]     ADVERSE EFFECTS ON LIVER  . Lipitor [Atorvastatin Calcium]       Outpatient Encounter Prescriptions as of 09/15/2013  Medication Sig  . aspirin EC 81 MG tablet Take 81 mg by mouth every morning.  . Bisacodyl (DULCOLAX PO) Take 1 tablet by mouth.  . cetirizine (ZYRTEC) 10 MG tablet Take 10 mg by mouth daily.  . cholecalciferol (VITAMIN D) 1000 UNITS tablet Take 1,000 Units by mouth every morning.  . diphenhydrAMINE (BENADRYL) 25 MG tablet Take 25 mg by mouth as needed for itching.  . diphenhydramine-acetaminophen (TYLENOL PM) 25-500 MG TABS Take 1 tablet by  mouth at bedtime.   [name] escitalopram (LEXAPRO) 10 MG tablet Take 10 mg by mouth at bedtime.  [name] ibuprofen (ADVIL,MOTRIN) 200 MG tablet Take 400 mg by mouth every 8 (eight) hours as needed for pain.   [name] lenalidomide (REVLIMID) 25 MG capsule Take 1 capsule (25 mg total) by mouth daily. Take one tablet daily for 21 days, then off 7 days, repeat q28days Berkley Harvey #[id number] on 08/25/2013 Faxed to [facility] at [phone]  . levothyroxine (SYNTHROID, LEVOTHROID) 150 MCG tablet Take 1 tablet (150 mcg total) by mouth daily.  . mupirocin ointment (BACTROBAN) 2 % Place small amount into each nostril twice daily x 5 days.  After application, press sides of nose together and gently massage.  [name] omeprazole (PRILOSEC) 40 MG capsule Take 1 capsule (40 mg total) by mouth every morning.  . hydrocortisone  (ANUSOL-HC) 25 MG suppository Place 1 suppository (25 mg total) rectally every 12 (twelve) hours.  [name] MOVIPREP 100 G SOLR Take 1 kit (200 g total) by mouth as directed.  . polyethylene glycol (MIRALAX / GLYCOLAX) packet Take 17 g by mouth as needed.   . predniSONE (DELTASONE) 20 MG tablet Take 40 mg by mouth daily.  . [DISCONTINUED] azithromycin (ZITHROMAX) 250 MG tablet Take as directed  . [DISCONTINUED] ezetimibe (ZETIA) 10 MG tablet Take 10 mg by mouth every morning.  . [DISCONTINUED] loratadine (CLARITIN) 10 MG tablet Take 10 mg by mouth every morning.     REVIEW OF SYSTEMS  : All other systems reviewed and negative except where noted in the History of Present Illness.   PHYSICAL EXAM: BP 90/70  Pulse 84  Ht 5' 9.5" (1.765 m)  Wt 178 lb 6 oz (80.91 kg)  BMI 25.97 kg/m2  SpO2 98% General: Well developed white male in no acute distress; slightly anxious. Head: Normocephalic and atraumatic Eyes:  Sclerae anicteric, conjunctiva pink. Ears: Normal auditory acuity. Lungs: Clear throughout to auscultation Heart: Regular rate and rhythm Abdomen: Soft, non-distended.  BS present.  Non-tender. Rectal:  External hemorrhoid noted without bleeding.  DRE did not reveal any masses.  Very small amount of light brown stool on exam glove with trace amount of pink blood.  Heme positive. Musculoskeletal: Symmetrical with no gross deformities  Skin:  He has a diffuse erythematous rash that looks somewhat like a contact dermatitis covering his back and on his upper arms. Extremities: No edema  Neurological: Alert oriented x 4, grossly non-focal. Psychological:  Alert and cooperative. Normal mood and affect  ASSESSMENT AND PLAN: -Rectal bleeding:  Could be hemorrhoidal or diverticular (had diverticulosis on previous colonoscopy in 2007).  Will treat with hydrocortisone suppositories at bedtime for now.  Will schedule colonoscopy for further evaluation.  The risks, benefits, and alternatives were  discussed with the patient and he consents to proceed.  CBC is monitored regularly by oncology.  *I told him that he needs to contact and discuss the other issues regarding his rash and poor circulation with his PCP and/or oncology as they maybe be side effects or related to his chemo.

## 2013-09-15 NOTE — Patient Instructions (Addendum)
We sent prescriptions to [facility], [address] 1. Colonoscopy prep  (Moviprep) 2. Anusol HC Suppository  You have been scheduled for a colonoscopy with propofol. Please follow written instructions given to you at your visit today.  Please pick up your prep kit at the pharmacy within the next 1-3 days. If you use inhalers (even only as needed), please bring them with you on the day of your procedure.

## 2013-09-15 NOTE — Telephone Encounter (Signed)
Patient called reporting rectal bleeding.  "I've called GI, Dr./ Juanda Chance and haven't heard back yet.  I've had this happen before and had an ulcer problem.  Blood on tissue and on the stool.  None in the water.  I've been taking ibuprofen for pain in my rib area.  Blood is not random it's when I have a bowel movement."  Asked if constipated and says I have been before.  This nurse instructed to go to ER for evaluation.  Wants to wait a little longer for a return call from Dr. Regino Schultze office.  This nurse suggested he try something new for pain that does not affect or thin blood and to drink more water or stool softeners to not strain with bowel movements.  Will notify providers.  Next scheduled f/u here is 10-29-2013.

## 2013-09-16 NOTE — Progress Notes (Signed)
Reviewed and agree. He is my next door neighbor. When is his colonoscopy? I can do him during my hospital week if there are no available spots before that or even next Thursday Jan 8 on my day off, at [facility], conscious sedation

## 2013-09-17 ENCOUNTER — Other Ambulatory Visit (INDEPENDENT_AMBULATORY_CARE_PROVIDER_SITE_OTHER): Payer: Commercial Managed Care - PPO

## 2013-09-17 ENCOUNTER — Other Ambulatory Visit: Payer: Self-pay | Admitting: Internal Medicine

## 2013-09-17 ENCOUNTER — Encounter: Payer: Self-pay | Admitting: Internal Medicine

## 2013-09-17 ENCOUNTER — Ambulatory Visit (INDEPENDENT_AMBULATORY_CARE_PROVIDER_SITE_OTHER): Payer: Commercial Managed Care - PPO | Admitting: Internal Medicine

## 2013-09-17 ENCOUNTER — Telehealth: Payer: Self-pay | Admitting: *Deleted

## 2013-09-17 VITALS — BP 120/82 | HR 68 | Temp 95.7°F | Resp 16 | Wt 180.0 lb

## 2013-09-17 DIAGNOSIS — E039 Hypothyroidism, unspecified: Secondary | ICD-10-CM

## 2013-09-17 DIAGNOSIS — C9000 Multiple myeloma not having achieved remission: Secondary | ICD-10-CM

## 2013-09-17 DIAGNOSIS — I7300 Raynaud's syndrome without gangrene: Secondary | ICD-10-CM

## 2013-09-17 DIAGNOSIS — R21 Rash and other nonspecific skin eruption: Secondary | ICD-10-CM

## 2013-09-17 LAB — URINALYSIS, ROUTINE W REFLEX MICROSCOPIC
Leukocytes, UA: NEGATIVE
Nitrite: NEGATIVE
Total Protein, Urine: NEGATIVE
Urine Glucose: NEGATIVE
pH: 6 (ref 5.0–8.0)

## 2013-09-17 LAB — BASIC METABOLIC PANEL
BUN: 19 mg/dL (ref 6–23)
CO2: 26 mEq/L (ref 19–32)
Chloride: 103 mEq/L (ref 96–112)
Creatinine, Ser: 1.2 mg/dL (ref 0.4–1.5)
Potassium: 3.5 mEq/L (ref 3.5–5.1)

## 2013-09-17 LAB — SEDIMENTATION RATE: Sed Rate: 45 mm/hr — ABNORMAL HIGH (ref 0–22)

## 2013-09-17 MED ORDER — METHYLPREDNISOLONE ACETATE 80 MG/ML IJ SUSP
80.0000 mg | Freq: Once | INTRAMUSCULAR | Status: AC
  Administered 2013-09-17: 80 mg via INTRAMUSCULAR

## 2013-09-17 MED ORDER — PREDNISONE 10 MG PO TABS: ORAL_TABLET | ORAL | Status: DC

## 2013-09-17 NOTE — Assessment & Plan Note (Signed)
F/u Dr Magrinat 

## 2013-09-17 NOTE — Assessment & Plan Note (Signed)
New 12/14 - poss Revlimid related  Depomedrol 80 mg im Prednisone 10 mg: take 4 tabs a day x 3 days; then 3 tabs a day x 4 days; then 2 tabs a day x 4 days, then 1 tab a day x 6 days, then stop. Take pc.

## 2013-09-17 NOTE — Progress Notes (Signed)
Pre visit review using our clinic review tool, if applicable. No additional management support is needed unless otherwise documented below in the visit note. 

## 2013-09-17 NOTE — Progress Notes (Signed)
Subjective:     HPI  C/o poor circulation - fingers are turning white in the cold C/o rash and itching x 4 mo - worse; he had to stop running; not feeling well x 2 mo C/o R CP after a fall  The patient  needs to address  chronic hypertension that has been well controlled with medicines; to address chronic fatigue, insomnia,  hyperlipidemia F/u  depression.  BP Readings from Last 3 Encounters:  09/17/13 120/82  09/15/13 90/70  08/26/13 103/67   Wt Readings from Last 3 Encounters:  09/17/13 180 lb (81.647 kg)  09/15/13 178 lb 6 oz (80.91 kg)  08/26/13 182 lb 4.8 oz (82.691 kg)       Review of Systems  Constitutional: Positive for fatigue. Negative for diaphoresis, appetite change and unexpected weight change.  HENT: Negative for congestion, facial swelling, nosebleeds, sneezing, sore throat and trouble swallowing.   Eyes: Negative for itching and visual disturbance.  Respiratory: Negative for cough, chest tightness and wheezing.   Cardiovascular: Negative for chest pain, palpitations and leg swelling.  Gastrointestinal: Negative for nausea, vomiting, diarrhea, blood in stool and abdominal distention.  Genitourinary: Negative for dysuria, urgency, frequency and hematuria.  Musculoskeletal: Negative for back pain, gait problem, joint swelling and neck pain.  Skin: Positive for pallor and rash.  Neurological: Positive for weakness. Negative for dizziness, tremors and speech difficulty.  Psychiatric/Behavioral: Positive for dysphoric mood. Negative for suicidal ideas, sleep disturbance and agitation. The patient is nervous/anxious.        Objective:   Physical Exam  Constitutional: He is oriented to person, place, and time. He appears well-developed. No distress.  Appears tired  HENT:  Mouth/Throat: Oropharynx is clear and moist.  Eyes: Conjunctivae are normal. Pupils are equal, round, and reactive to light.  Neck: Normal range of motion. No JVD present. No thyromegaly  present.  Cardiovascular: Normal rate, regular rhythm, normal heart sounds and intact distal pulses.  Exam reveals no gallop and no friction rub.   No murmur heard. 4 fingertips are white and cool  Pulmonary/Chest: Effort normal and breath sounds normal. No respiratory distress. He has no wheezes. He has no rales. He exhibits no tenderness.  Abdominal: Soft. Bowel sounds are normal. He exhibits no distension and no mass. There is no tenderness. There is no rebound and no guarding.  Genitourinary: Rectum normal and prostate normal. Guaiac negative stool.  Musculoskeletal: Normal range of motion. He exhibits no edema and no tenderness.  Lymphadenopathy:    He has no cervical adenopathy.  Neurological: He is alert and oriented to person, place, and time. He has normal reflexes. No cranial nerve deficit. He exhibits normal muscle tone. Coordination normal.  Skin: Skin is warm and dry. No rash noted.  Diffuse erythematous and scaly rash on face, neck, trunk  Psychiatric: He has a normal mood and affect. His behavior is normal. Judgment and thought content normal.    Lab Results  Component Value Date   WBC 3.2* 09/03/2013   HGB 9.8* 09/03/2013   HCT 28.7* 09/03/2013   PLT 80* 09/03/2013   GLUCOSE 76 08/26/2013   CHOL 211* 11/13/2012   TRIG 73.0 11/13/2012   HDL 44.70 11/13/2012   LDLDIRECT 155.8 11/13/2012   LDLCALC 84 03/28/2012   ALT 20 08/26/2013   AST 20 08/26/2013   NA 138 08/26/2013   K 4.5 08/26/2013   CL 106 06/16/2013   CREATININE 1.0 08/26/2013   BUN 17.2 08/26/2013   CO2 23 08/26/2013  TSH 13.52* 06/16/2013   PSA 0.95 06/16/2013   INR 0.91 03/31/2013   HGBA1C 5.4 03/27/2012          Assessment & Plan:

## 2013-09-17 NOTE — Assessment & Plan Note (Signed)
New 12/14 - poss Revlimid related Gloves

## 2013-09-17 NOTE — Patient Instructions (Signed)
Use good gloves

## 2013-09-17 NOTE — Assessment & Plan Note (Signed)
Labs

## 2013-09-17 NOTE — Telephone Encounter (Signed)
This RN spoke with pt per his call stating concern per Dr Posey Rea that rash and poor circulation if fingers is being caused by the Revlimid. Andre [name]'s feeling is that it is the Revlimid that is the cause too.  At present Andre [name] has taken 1 dose of his " next on week ".  Per phone discussion- Andre [name] will hold his Revlimid at present. This note will be given to MD for review upon his return to the office on 09/22/2013.

## 2013-09-18 ENCOUNTER — Encounter: Payer: Self-pay | Admitting: Internal Medicine

## 2013-09-19 ENCOUNTER — Other Ambulatory Visit: Payer: Self-pay | Admitting: *Deleted

## 2013-09-20 ENCOUNTER — Other Ambulatory Visit: Payer: Self-pay | Admitting: Oncology

## 2013-09-22 ENCOUNTER — Other Ambulatory Visit: Payer: Self-pay | Admitting: Oncology

## 2013-09-23 ENCOUNTER — Ambulatory Visit (AMBULATORY_SURGERY_CENTER): Payer: Commercial Managed Care - PPO | Admitting: Internal Medicine

## 2013-09-23 ENCOUNTER — Encounter: Payer: Self-pay | Admitting: Internal Medicine

## 2013-09-23 ENCOUNTER — Other Ambulatory Visit: Payer: Commercial Managed Care - PPO

## 2013-09-23 ENCOUNTER — Telehealth: Payer: Self-pay | Admitting: Oncology

## 2013-09-23 VITALS — BP 107/59 | HR 50 | Temp 96.5°F | Resp 17 | Ht 69.5 in | Wt 178.0 lb

## 2013-09-23 DIAGNOSIS — K625 Hemorrhage of anus and rectum: Secondary | ICD-10-CM

## 2013-09-23 MED ORDER — SODIUM CHLORIDE 0.9 % IV SOLN: 500.0000 mL | INTRAVENOUS | Status: DC

## 2013-09-23 MED ORDER — HYDROCORTISONE ACETATE 25 MG RE SUPP: 25.0000 mg | Freq: Every evening | RECTAL | Status: DC | PRN

## 2013-09-23 NOTE — Progress Notes (Signed)
A/ox3 pleased with MAC, report to Wendy RN 

## 2013-09-23 NOTE — Progress Notes (Addendum)
Called to room to assist during endoscopic procedure.  Patient ID and intended procedure confirmed with present staff. Received instructions for my participation in the procedure from the performing physician.    There was no polyp after all.

## 2013-09-23 NOTE — Telephone Encounter (Signed)
, °

## 2013-09-23 NOTE — Patient Instructions (Signed)
YOU HAD AN ENDOSCOPIC PROCEDURE TODAY AT THE [facility]: Refer to the procedure report that was given to you for any specific questions about what was found during the examination.  If the procedure report does not answer your questions, please call your gastroenterologist to clarify.  If you requested that your care partner not be given the details of your procedure findings, then the procedure report has been included in a sealed envelope for you to review at your convenience later.  YOU SHOULD EXPECT: Some feelings of bloating in the abdomen. Passage of more gas than usual.  Walking can help get rid of the air that was put into your GI tract during the procedure and reduce the bloating. If you had a lower endoscopy (such as a colonoscopy or flexible sigmoidoscopy) you may notice spotting of blood in your stool or on the toilet paper. If you underwent a bowel prep for your procedure, then you may not have a normal bowel movement for a few days.  DIET: Your first meal following the procedure should be a light meal and then it is ok to progress to your normal diet.  A half-sandwich or bowl of soup is an example of a good first meal.  Heavy or fried foods are harder to digest and may make you feel nauseous or bloated.  Likewise meals heavy in dairy and vegetables can cause extra gas to form and this can also increase the bloating.  Drink plenty of fluids but you should avoid alcoholic beverages for 24 hours.  ACTIVITY: Your care partner should take you home directly after the procedure.  You should plan to take it easy, moving slowly for the rest of the day.  You can resume normal activity the day after the procedure however you should NOT DRIVE or use heavy machinery for 24 hours (because of the sedation medicines used during the test).    SYMPTOMS TO REPORT IMMEDIATELY: A gastroenterologist can be reached at any hour.  During normal business hours, 8:30 AM to 5:00 PM Monday through Friday,  call [phone].  After hours and on weekends, please call the GI answering service at [phone] who will take a message and have the physician on call contact you.   Following lower endoscopy (colonoscopy or flexible sigmoidoscopy):  Excessive amounts of blood in the stool  Significant tenderness or worsening of abdominal pains  Swelling of the abdomen that is new, acute  Fever of 100F or higher  FOLLOW UP: If any biopsies were taken you will be contacted by phone or by letter within the next 1-3 weeks.  Call your gastroenterologist if you have not heard about the biopsies in 3 weeks.  Our staff will call the home number listed on your records the next business day following your procedure to check on you and address any questions or concerns that you may have at that time regarding the information given to you following your procedure. This is a courtesy call and so if there is no answer at the home number and we have not heard from you through the emergency physician on call, we will assume that you have returned to your regular daily activities without incident.  SIGNATURES/CONFIDENTIALITY: You and/or your care partner have signed paperwork which will be entered into your electronic medical record.  These signatures attest to the fact that that the information above on your After Visit Summary has been reviewed and is understood.  Full responsibility of the confidentiality of this   discharge information lies with you and/or your care-partner.  Recommendations Reduce Advil intake High-fiber diet Anusol HC suppositories as needed for symptomatic hemorrhoids  Laxative and stool softeners as needed for constipation Next colonoscopy in 10 years

## 2013-09-23 NOTE — Op Note (Signed)
[place]  [address] [name], [id number]   COLONOSCOPY PROCEDURE REPORT  PATIENT: Andre [name], Andre [name].  MR#: [id number] BIRTHDATE: [birth year] , 62  yrs. old GENDER: Male ENDOSCOPIST: Lafayette Dragon, MD REFERRED JD:[facility] Avel Sensor, M.D, Dr Gunnar Bulla Magrinat. PROCEDURE DATE:  09/23/2013 PROCEDURE:   Colonoscopy, diagnostic First Screening Colonoscopy - Avg.  risk and is 50 yrs.  old or older - No.  Prior Negative Screening - Now for repeat screening. N/A  History of Adenoma - Now for follow-up colonoscopy & has been > or = to 3 yrs.  N/A  Polyps Removed Today? No.  Recommend repeat exam, <10 yrs? No. ASA CLASS:   Class III INDICATIONS:anal bleeding and Anemia, multiple myeloma. Last colonoscopy 2007 showed internal hemorrhoids MEDICATIONS: MAC sedation, administered by CRNA and propofol (Diprivan) 270m IV  DESCRIPTION OF PROCEDURE:   After the risks benefits and alternatives of the procedure were thoroughly explained, informed consent was obtained.  A digital rectal exam revealed no abnormalities of the rectum.   The LB [id number] endoscope was introduced through the anus and advanced to the cecum, which was identified by both the appendix and ileocecal valve. No adverse events experienced.   The quality of the prep was good, using MoviPrep  The instrument was then slowly withdrawn as the colon was fully examined.      COLON FINDINGS: Small internal hemorrhoids were found.  Retroflexed views revealed no abnormalities. The time to cecum=12 minutes 20 seconds.  Withdrawal time=8 minutes 22 seconds.  The scope was withdrawn and the procedure completed. COMPLICATIONS: There were no complications.  ENDOSCOPIC IMPRESSION: Small internal hemorrhoids ,no evidence of active bleeding. Suspect the source of bleeding was hemorrhoidal no significant diverticulosis noted  RECOMMENDATIONS: reduce Advil intake High-fiber diet Anusol HC suppositories as needed  for symptomatic hemorrhoids Lax and stool softeners as needed for constipation Recall colonoscopy in 10 years   eSigned:  DLafayette Dragon MD 09/23/2013 10:49 AM   cc:   PATIENT NAME:  [name], [name] MR#: [id number]

## 2013-09-24 ENCOUNTER — Telehealth: Payer: Self-pay | Admitting: *Deleted

## 2013-09-24 NOTE — Telephone Encounter (Deleted)
  Follow up Call-  Call back number 09/23/2013  Post procedure Call Back phone  # [phone]  Permission to leave phone message Yes     Patient questions:  Do you have a fever, pain , or abdominal swelling? {yes no:314532} Pain Score  {NUMBERS; 0-10:5044} *  Have you tolerated food without any problems? {yes no:314532}  Have you been able to return to your normal activities? {yes no:314532}  Do you have any questions about your discharge instructions: Diet   {yes no:314532} Medications  {yes no:314532} Follow up visit  {yes no:314532}  Do you have questions or concerns about your Care? {yes no:314532}  Actions: * If pain score is 4 or above: {ACTION; [facility] ENDO PAIN >4:21563::"No action needed, pain <4."}

## 2013-09-24 NOTE — Telephone Encounter (Signed)
Name [place].  LMOM if any questions or concerns, please give [place] a call.

## 2013-09-25 ENCOUNTER — Other Ambulatory Visit: Payer: Commercial Managed Care - PPO

## 2013-09-25 ENCOUNTER — Ambulatory Visit (HOSPITAL_BASED_OUTPATIENT_CLINIC_OR_DEPARTMENT_OTHER): Payer: Commercial Managed Care - PPO | Admitting: Oncology

## 2013-09-25 ENCOUNTER — Encounter: Payer: Commercial Managed Care - PPO | Admitting: Physician Assistant

## 2013-09-25 ENCOUNTER — Other Ambulatory Visit (HOSPITAL_BASED_OUTPATIENT_CLINIC_OR_DEPARTMENT_OTHER): Payer: Commercial Managed Care - PPO

## 2013-09-25 VITALS — BP 117/68 | HR 50 | Temp 97.7°F | Resp 18 | Ht 69.5 in | Wt 176.8 lb

## 2013-09-25 DIAGNOSIS — C9000 Multiple myeloma not having achieved remission: Secondary | ICD-10-CM

## 2013-09-25 LAB — CBC WITH DIFFERENTIAL/PLATELET
BASO%: 0.7 % (ref 0.0–2.0)
Basophils Absolute: 0 10*3/uL (ref 0.0–0.1)
EOS%: 0.3 % (ref 0.0–7.0)
Eosinophils Absolute: 0 10*3/uL (ref 0.0–0.5)
HCT: 33.5 % — ABNORMAL LOW (ref 38.4–49.9)
HGB: 11.1 g/dL — ABNORMAL LOW (ref 13.0–17.1)
LYMPH#: 0.9 10*3/uL (ref 0.9–3.3)
LYMPH%: 29.7 % (ref 14.0–49.0)
MCH: 34 pg — ABNORMAL HIGH (ref 27.2–33.4)
MCHC: 33.2 g/dL (ref 32.0–36.0)
MCV: 102.6 fL — ABNORMAL HIGH (ref 79.3–98.0)
MONO#: 0.2 10*3/uL (ref 0.1–0.9)
MONO%: 6.9 % (ref 0.0–14.0)
NEUT#: 1.9 10*3/uL (ref 1.5–6.5)
NEUT%: 62.4 % (ref 39.0–75.0)
Platelets: 84 10*3/uL — ABNORMAL LOW (ref 140–400)
RBC: 3.26 10*6/uL — ABNORMAL LOW (ref 4.20–5.82)
RDW: 18.5 % — ABNORMAL HIGH (ref 11.0–14.6)
WBC: 3.1 10*3/uL — ABNORMAL LOW (ref 4.0–10.3)

## 2013-09-25 MED ORDER — LENALIDOMIDE 10 MG PO CAPS: ORAL_CAPSULE | ORAL | Status: DC

## 2013-09-25 NOTE — Progress Notes (Signed)
ID: Andre [name]   DOB: 1950-10-19  MR#: [id number]  HYI#:[id number]   PCP: [name] SU: OTHER MD: [name], [name], Shanon Rosser  CHIEF COMPLAINT: Multiple myeloma   HISTORY OF PRESENT ILLNESS: The patient was worked up for peptic ulcer disease in August of 2011, with significant bleeding and anemia.  The patient was Helicobacter pylori negative.  He received some epinephrine when he had his EGD and then started on Protonix.  [name] anemia slowly resolved so that by September, his hemoglobin was up to 10.9 and by earlier this month, his hemoglobin was up to 12.5.    As part of his general workup, he was found to have a slightly elevated globulin fraction.  In September, his total protein was 8.3 with an albumin of 3.8.  In January, the total protein was 8.4 and albumin 3.6.  With persistence of this slight abnormality, Dr. Olevia Perches obtained serum immunofixation and SPEP.  The SPTP showed an M-spike of 2.67 grams.  A total IgG was 4,190.  Total IgA low at 28.  Total IgM low at 28 also.  The immunofixation showed a monoclonal IgG lambda paraprotein.  There were also monoclonal free lambda light chains present. With this information, the patient was referred for further evaluation.   A diagnosis of myeloma was confirmed by bone marrow biopsy and subsequebnt treatment is as detailed below  INTERVAL HISTORY: Andre [name] returns today for followup of his multiple myeloma. Since his last visit here he took a trip by himself to the [facility], which she enjoyed. While there he ran a half marathon.   REVIEW OF SYSTEMS: Andre [name] called [place] a couple weeks ago complaining of an upper respiratory infection that would not clear. He was producing yellow phlegm, although temperatures were mild. We wrote him for a Z-Pak and had cleared it. Also the "bump" on the back of his neck has resolved without any other intervention. He denies constipation or peripheral neuropathy  problems. He had a nice time over Thanksgiving's particularly of visiting with this baby grandchild he developed a cramp after her 12 miles a running, left flank, which cleared with hydration and a little bit of massage. He was able to finish the race. A detailed review of systems was otherwise noncontributory  PAST MEDICAL HISTORY: Past Medical History  Diagnosis Date  . Hypertension   . Hyperlipidemia   . Duodenal ulcer   . Multiple myeloma 07/04/2011  . Allergy   . Thyroid disease   . GERD (gastroesophageal reflux disease)   . Depression   . Anemia   Significant for peptic ulcer disease as noted above.  History of hyperlipidemia.  History of anxiety and depression.  History of GERD which is significantly improved with weight loss and history of reactive airway disease, possibly secondary to the GERD which also improved with weight loss.  There was a history of obesity, now much improved secondary to exercise and diet.   PAST SURGICAL HISTORY: Past Surgical History  Procedure Laterality Date  . Cardiolite study  11/25/2003    NORMAL  . Bone marrow transplant  2011    for MM  . Tonsillectomy      FAMILY HISTORY Family History  Problem Relation Age of Onset  . Throat cancer Mother   . Hypertension Father   . Stroke Father   . Asthma Father   . Diabetes Father   . Pancreatic cancer Brother   [name] father died at the age of 28 following  a stroke.  [name] mother died with cancer of the throat at the age of 58.   She had a history of depression and was a smoker.  Not clear how much alcohol she drank, according to the patient.  The patient had two brothers.  One died from the age of 61 from a "kidney problem."  The second one died at the age of 21 from pancreatic cancer. (This may have been duodenal cancer. 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  His wife of 37+ years was [name], but they are now divorced.   The patient has two daughters:  [name] who is 76, and Missildine who is 48.  They both live here in [place]. 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  The patient is very close to his daughters.  Constante has one daughter, [name], born 03/06/2013.  The patient is not a church attender.  He does derive a great deal of support from friends at the [employer] which he attends very regularly, and also participates in Al-Anon even though actually there is no alcohol or drug problem in the family.  He participates in this group and he gets quite a bit of support from it.      ADVANCED DIRECTIVES: In place  HEALTH MAINTENANCE: (updated 07/16/2013) History  Substance Use Topics  . Smoking status: Former Smoker -- 3 years    Quit date: 03/27/1969  . Smokeless tobacco: Never Used  . Alcohol Use: No     Colonoscopy:  PSA:  Sept 2014, normal/Dr. [name]  Lipid panel: Sept 2014/Dr. [name]   Allergies  Allergen Reactions  . Crestor [Rosuvastatin Calcium]     ADVERSE EFFECTS ON LIVER  . Lipitor [Atorvastatin Calcium]     Current Outpatient Prescriptions  Medication Sig Dispense Refill  . aspirin EC 81 MG tablet Take 81 mg by mouth every morning.      . Bisacodyl (DULCOLAX PO) Take 1 tablet by mouth.      . cetirizine (ZYRTEC) 10 MG tablet Take 10 mg by mouth daily.      . cholecalciferol (VITAMIN D) 1000 UNITS tablet Take 1,000 Units by mouth every morning.      . diphenhydrAMINE (BENADRYL) 25 MG tablet Take 25 mg by mouth as needed for itching.      . diphenhydramine-acetaminophen (TYLENOL PM) 25-500 MG TABS Take 1 tablet by mouth at bedtime.       [name] escitalopram (LEXAPRO) 10 MG tablet take 1 tablet by mouth once daily  30 tablet  11  . hydrocortisone (ANUSOL-HC) 25 MG suppository Place 1 suppository (25 mg total) rectally every 12 (twelve) hours.  10  suppository  1  . hydrocortisone (ANUSOL-HC) 25 MG suppository Place 1 suppository (25 mg total) rectally at bedtime as needed for hemorrhoids.  12 suppository  1  . ibuprofen (ADVIL,MOTRIN) 200 MG tablet Take 400 mg by mouth every 8 (eight) hours as needed for pain.       [name] lenalidomide (REVLIMID) 25 MG capsule Take 1 capsule (25 mg total) by mouth daily. Take one tablet daily for 21 days, then off 7 days, repeat q28days Josem Kaufmann #[id number] on 08/25/2013 Faxed to [facility] at [phone]  21 capsule  0  . levothyroxine (SYNTHROID, LEVOTHROID) 150 MCG tablet Take 1 tablet (150 mcg total)  by mouth daily.  90 tablet  3  . mupirocin ointment (BACTROBAN) 2 % Place small amount into each nostril twice daily x 5 days.  After application, press sides of nose together and gently massage.  22 g  0  . omeprazole (PRILOSEC) 40 MG capsule Take 1 capsule (40 mg total) by mouth every morning.  30 capsule  4  . polyethylene glycol (MIRALAX / GLYCOLAX) packet Take 17 g by mouth as needed.       . predniSONE (DELTASONE) 10 MG tablet Prednisone 10 mg: take 4 tabs a day x 3 days; then 3 tabs a day x 4 days; then 2 tabs a day x 4 days, then 1 tab a day x 6 days, then stop. Take pc.  38 tablet  1  . [DISCONTINUED] fexofenadine (ALLEGRA) 60 MG tablet Take 180 mg by mouth daily.         No current facility-administered medications for this visit.    OBJECTIVE: Middle-aged white man who appears well Filed Vitals:   09/25/13 1146  BP: 117/68  Pulse: 50  Temp: 97.7 F (36.5 C)  Resp: 18     Body mass index is 25.74 kg/(m^2).    ECOG FS: 0 Filed Weights   09/25/13 1146  Weight: 176 lb 12.8 oz (80.196 kg)   Physical Exam: Pupils were equal and round Oropharynx no thrush or other lesions There is no cervical or supraclavicular adenopathy. What was probably a blocked pore at the back of his neck has resolved. Heart regular rate and rhythm Abdomen soft, nontender, positive bowel sounds No peripheral edema Neuro is  nonfocal, well-oriented, appropriate affect  LAB RESULTS:   M-Spike, % g/dL 0.23  07/11/2013    0.35  06/03/2013    0.38  05/08/2013    0.31  04/11/2013    0.32  03/25/2013   Kappa Free Light Chains 2.65 07/11/2013     2.73 06/03/2013     3.51 05/08/2013     2.55 04/11/2013     2.51 03/25/2013   Lambda Free Light Chains 3.23 07/11/2013     3.49 06/03/2013     3.69 05/08/2013     2.55 04/11/2013     2.51 03/25/2013    Lab Results  Component Value Date   WBC 3.1* 09/25/2013   NEUTROABS 1.9 09/25/2013   HGB 11.1* 09/25/2013   HCT 33.5* 09/25/2013   MCV 102.6* 09/25/2013   PLT 84* 09/25/2013      Chemistry      Component Value Date/Time   NA 136 09/17/2013 0940   NA 138 08/26/2013 1016   K 3.5 09/17/2013 0940   K 4.5 08/26/2013 1016   CL 103 09/17/2013 0940   CL 107 03/10/2013 1352   CO2 26 09/17/2013 0940   CO2 23 08/26/2013 1016   BUN 19 09/17/2013 0940   BUN 17.2 08/26/2013 1016   CREATININE 1.2 09/17/2013 0940   CREATININE 1.0 08/26/2013 1016      Component Value Date/Time   CALCIUM 8.7 09/17/2013 0940   CALCIUM 8.8 08/26/2013 1016   ALKPHOS 81 08/26/2013 1016   ALKPHOS 62 06/16/2013 1547   AST 20 08/26/2013 1016   AST 26 06/16/2013 1547   ALT 20 08/26/2013 1016   ALT 23 06/16/2013 1547   BILITOT 0.51 08/26/2013 1016   BILITOT 0.5 06/16/2013 1547      STUDIES: No results found.  ASSESSMENT: 63 y.o.  [place] man with a history of multiple myeloma diagnosed February of 2012,  with an initial M-spike of 2.6 g/dL, IFE showing an IgG lambda paraprotein and free lambda light chains in the urine. Cytogenetics showed trisomy 103. Bone marrow biopsy showed a 22% plasmacytosis. Treated with   (1) bortezomib (subcutaneously), lenalidomide, and dexamethasone, with repeat bone marrow biopsy May of 2012 showing 10% plasmacytosis   (2) high-dose chemotherapy with BCNU and melphalan at [facility], followed by stem cell rescue July of 2012   (3) on zoledronic acid started December of 2012,  initially monthly, currently given every 3 months, most recent dose 01/28/2013  (4) low-dose lenalidomide resumed April 2013, interrupted several times since.  Resumed again on 02/19/2013, and a dose of 5 mg daily, 21 days on and 7 days off.  (5) CNS symptoms and abnormal brain MRI extensively evaluated by neurology with no definitive diagnosis established; improving   (6) rising M spike noted June 2014 but did not meet criteria for carfilzomib study  (7) on lenalidomide 25 mg daily, 14 days on, 7 days off, starting 04/18/2013   PLAN:  We have scheduled [name] for every 4 week as in light chains but for some reason that has not been done. I discussed this with the lab. I sent him back to get those labs drawn today. He will also have a TSH today.  I think it would be better if he had his labs drawn a few days before the visit and then there would be little question as to which to draw. Accordingly his next set of lab work will be January 6. He will see Dr. Alvie Heidelberg at [facility] January 15. Her labs he will be February 3 and then he'll see me February 10.  At this point it appears that Andre [name] is doing well as far as his myeloma is concerned. He is tolerating the lenalidomide well. The plan is to continue on a 14 days on, 7 days off pattern indefinitely or until disease progression. He knows to call for any problems that may develop before his next visit here.  Chauncey Cruel, MD     09/25/2013

## 2013-09-25 NOTE — Progress Notes (Signed)
ID: ARGYLE GUSTAFSON   DOB: 01/10/1951  MR#: [id number]  NIO#:[id number]   PCP: Cristie Hem [name] SU: OTHER MD: [name], [name], Shanon Rosser  CHIEF COMPLAINT: Multiple myeloma   HISTORY OF PRESENT ILLNESS: The patient was worked up for peptic ulcer disease in August of 2011, with significant bleeding and anemia.  The patient was Helicobacter pylori negative.  He received some epinephrine when he had his EGD and then started on Protonix.  [name] anemia slowly resolved so that by September, his hemoglobin was up to 10.9 and by earlier this month, his hemoglobin was up to 12.5.    As part of his general workup, he was found to have a slightly elevated globulin fraction.  In September, his total protein was 8.3 with an albumin of 3.8.  In January, the total protein was 8.4 and albumin 3.6.  With persistence of this slight abnormality, Dr. Olevia Perches obtained serum immunofixation and SPEP.  The SPTP showed an M-spike of 2.67 grams.  A total IgG was 4,190.  Total IgA low at 28.  Total IgM low at 28 also.  The immunofixation showed a monoclonal IgG lambda paraprotein.  There were also monoclonal free lambda light chains present. With this information, the patient was referred for further evaluation.   A diagnosis of myeloma was confirmed by bone marrow biopsy and subsequebnt treatment is as detailed below  INTERVAL HISTORY: [name] returns today for followup of his multiple myeloma. The interval history is significant for having developed a total body rash, which was evaluated by his primary care physician, Dr. Alain Marion. He stopped the lenalidomide December 29 and received a steroid taper. He tells me the rash is better.   REVIEW OF SYSTEMS: [name] tolerated the 25 mg lenalidomide moderately well. He does get constipated from it. He finds it very hard to regulate his bowel movements. He is supposed to be on stool softeners and MiraLAX daily, but for some reason he doesn't  quite do that but waits until he gets constipated before trying to work on that. Even though he complains of feeling tired he has done a couple of half marathons recently. He feels a little bit more forgetful than usual. He has had no bleeding, no fever, no unexplained weight loss, and no pain. He just had a colonoscopy earlier this week which was unremarkable except for internal hemorrhoids. Overall he "feels good" and doesn't feel any significantly better off treatment. A detailed review of systems today was otherwise noncontributory  PAST MEDICAL HISTORY: Past Medical History  Diagnosis Date  . Hypertension   . Hyperlipidemia   . Duodenal ulcer   . Multiple myeloma 07/04/2011  . Allergy   . Thyroid disease   . GERD (gastroesophageal reflux disease)   . Depression   . Anemia   Significant for peptic ulcer disease as noted above.  History of hyperlipidemia.  History of anxiety and depression.  History of GERD which is significantly improved with weight loss and history of reactive airway disease, possibly secondary to the GERD which also improved with weight loss.  There was a history of obesity, now much improved secondary to exercise and diet.   PAST SURGICAL HISTORY: Past Surgical History  Procedure Laterality Date  . Cardiolite study  11/25/2003    NORMAL  . Bone marrow transplant  2011    for MM  . Tonsillectomy      FAMILY HISTORY Family History  Problem Relation Age of Onset  . Throat cancer Mother   .  Hypertension Father   . Stroke Father   . Asthma Father   . Diabetes Father   . Pancreatic cancer Brother   [name] father died at the age of 33 following a stroke.  [name] mother died with cancer of the throat at the age of 40.   She had a history of depression and was a smoker.  Not clear how much alcohol she drank, according to the patient.  The patient had two brothers.  One died from the age of 27 from a "kidney problem."  The second one died at the age of 43  from pancreatic cancer. (This may have been duodenal cancer. 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  His wife of 37+ years was [name], but they are now divorced.   The patient has two daughters:  [name] who is 76, and Lippy who is 68.  They both live here in [place]. 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  The patient is very close to his daughters.  Schoonmaker has one daughter, [name], born 03/06/2013, apparently with [employer] The patient is not a church attender.  He does derive a great deal of support from friends at the "[employer] which he attends very regularly, and also participates in Al-Anon even though actually there is no alcohol or drug problem in the family.  He participates in this group and he gets quite a bit of support from it.      ADVANCED DIRECTIVES: In place  HEALTH MAINTENANCE: (updated 07/16/2013) History  Substance Use Topics  . Smoking status: Former Smoker -- 3 years    Quit date: 03/27/1969  . Smokeless tobacco: Never Used  . Alcohol Use: No     Colonoscopy:  PSA:  Sept 2014, normal/Dr. [name]  Lipid panel: Sept 2014/Dr. [name]   Allergies  Allergen Reactions  . Crestor [Rosuvastatin Calcium]     ADVERSE EFFECTS ON LIVER  . Lipitor [Atorvastatin Calcium]     Current Outpatient Prescriptions  Medication Sig Dispense Refill  . aspirin EC 81 MG tablet Take 81 mg by mouth every morning.      . Bisacodyl (DULCOLAX PO) Take 1 tablet by mouth.      . cetirizine (ZYRTEC) 10 MG tablet Take 10 mg by mouth daily.      . cholecalciferol (VITAMIN D) 1000 UNITS tablet Take 1,000 Units by mouth every morning.      . diphenhydrAMINE (BENADRYL) 25 MG tablet Take 25 mg by mouth as needed for itching.      . diphenhydramine-acetaminophen (TYLENOL  PM) 25-500 MG TABS Take 1 tablet by mouth at bedtime.       [name] escitalopram (LEXAPRO) 10 MG tablet take 1 tablet by mouth once daily  30 tablet  11  . hydrocortisone (ANUSOL-HC) 25 MG suppository Place 1 suppository (25 mg total) rectally every 12 (twelve) hours.  10 suppository  1  . hydrocortisone (ANUSOL-HC) 25 MG suppository Place 1 suppository (25 mg total) rectally at bedtime as needed for hemorrhoids.  12 suppository  1  . ibuprofen (ADVIL,MOTRIN) 200 MG tablet Take 400 mg by mouth every 8 (eight) hours as needed for pain.       [name] lenalidomide (REVLIMID) 25 MG capsule Take 1 capsule (25 mg total) by mouth daily. Take one  tablet daily for 21 days, then off 7 days, repeat q28days Josem Kaufmann #[id number] on 08/25/2013 Faxed to [facility] at [phone]  21 capsule  0  . levothyroxine (SYNTHROID, LEVOTHROID) 150 MCG tablet Take 1 tablet (150 mcg total) by mouth daily.  90 tablet  3  . mupirocin ointment (BACTROBAN) 2 % Place small amount into each nostril twice daily x 5 days.  After application, press sides of nose together and gently massage.  22 g  0  . omeprazole (PRILOSEC) 40 MG capsule Take 1 capsule (40 mg total) by mouth every morning.  30 capsule  4  . polyethylene glycol (MIRALAX / GLYCOLAX) packet Take 17 g by mouth as needed.       . predniSONE (DELTASONE) 10 MG tablet Prednisone 10 mg: take 4 tabs a day x 3 days; then 3 tabs a day x 4 days; then 2 tabs a day x 4 days, then 1 tab a day x 6 days, then stop. Take pc.  38 tablet  1  . [DISCONTINUED] fexofenadine (ALLEGRA) 60 MG tablet Take 180 mg by mouth daily.         No current facility-administered medications for this visit.    OBJECTIVE: Middle-aged white man who appears well There were no vitals filed for this visit.   There is no weight on file to calculate BMI.    ECOG FS: 1 There were no vitals filed for this visit. Physical Exam: Pupils were equal and reactive Oropharynx no thrush or other lesions There is no cervical or  supraclavicular adenopathy Heart regular rate and rhythm Abdomen soft, nontender, positive bowel sounds No peripheral edema Neuro nonfocal, well-oriented, appropriate affect Skin: There is a minimally palpable erythematous blanching rash over the trunk, back greater than front, associated with some peeling and minimal scaliness. This is confluent.  LAB RESULTS: Results for MIECZYSLAW, STAMAS (MRN [id number]) as of 09/25/2013 12:49  Ref. Range 03/10/2013 13:52 03/25/2013 14:10 04/11/2013 09:43 05/08/2013 14:27 06/03/2013 08:17 07/11/2013 08:10 07/29/2013 12:59 08/26/2013 10:16  Kappa free light chain Latest Range: 0.33-1.94 mg/dL 3.47 (H) 2.52 (H) 1.81 3.51 (H) 2.73 (H) 2.65 (H)  5.19 (H)  Lambda Free Lght Chn Latest Range: 0.57-2.63 mg/dL 3.67 (H) 2.51 2.55 3.69 (H) 3.49 (H) 3.23 (H)  5.13 (H)  Kappa:Lambda Ratio Latest Range: 0.26-1.65  0.95 1.00 0.71 0.95 0.78 0.82  1.01   Results for ALONTAE, CHALOUX (MRN [id number]) as of 09/25/2013 12:49  Ref. Range 03/10/2013 13:52 03/25/2013 14:10 04/11/2013 09:43 05/08/2013 14:27 06/03/2013 08:17 07/11/2013 08:10 07/29/2013 12:59 08/26/2013 10:16  M-SPIKE, % No range found 0.27 0.32 0.31 0.38 0.35 0.23 0.33 0.34   Results for LINLEY, MOXLEY (MRN [id number]) as of 09/25/2013 12:49  Ref. Range 10/01/2012 08:40 11/05/2012 08:05 01/27/2013 13:22 03/10/2013 13:52 05/08/2013 14:27 06/03/2013 08:17 07/11/2013 08:10 08/19/2013 08:14  PROTEIN CREATININE RATIO Latest Range: <0.15  0.04 0.10 * * * 0.05 * 0.09     Lab Results  Component Value Date   WBC 3.1* 09/25/2013   NEUTROABS 1.9 09/25/2013   HGB 11.1* 09/25/2013   HCT 33.5* 09/25/2013   MCV 102.6* 09/25/2013   PLT 84* 09/25/2013      Chemistry      Component Value Date/Time   NA 136 09/17/2013 0940   NA 138 08/26/2013 1016   K 3.5 09/17/2013 0940   K 4.5 08/26/2013 1016   CL 103 09/17/2013 0940   CL 107 03/10/2013 1352   CO2 26 09/17/2013 0940   CO2 23  08/26/2013 1016   BUN 19 09/17/2013 0940   BUN 17.2 08/26/2013 1016    CREATININE 1.2 09/17/2013 0940   CREATININE 1.0 08/26/2013 1016      Component Value Date/Time   CALCIUM 8.7 09/17/2013 0940   CALCIUM 8.8 08/26/2013 1016   ALKPHOS 81 08/26/2013 1016   ALKPHOS 62 06/16/2013 1547   AST 20 08/26/2013 1016   AST 26 06/16/2013 1547   ALT 20 08/26/2013 1016   ALT 23 06/16/2013 1547   BILITOT 0.51 08/26/2013 1016   BILITOT 0.5 06/16/2013 1547      STUDIES: Dg Chest 2 View  09/03/2013   CLINICAL DATA:  History of trauma, chest pain  EXAM: CHEST  2 VIEW  COMPARISON:  None.  FINDINGS: The heart size and mediastinal contours are within normal limits. Both lungs are clear. There is minimal dextroscoliosis within the thoracolumbar spine.  IMPRESSION: No active cardiopulmonary disease.   Electronically Signed   By: Margaree Mackintosh M.D.   On: 09/03/2013 13:57    ASSESSMENT: 63 y.o.  [place] man with a history of multiple myeloma diagnosed February of 2012, with an initial M-spike of 2.6 g/dL, IFE showing an IgG lambda paraprotein and free lambda light chains in the urine. Cytogenetics showed trisomy 31. Bone marrow biopsy showed a 22% plasmacytosis. Treated with   (1) bortezomib (subcutaneously), lenalidomide, and dexamethasone, with repeat bone marrow biopsy May of 2012 showing 10% plasmacytosis   (2) high-dose chemotherapy with BCNU and melphalan at [facility], followed by stem cell rescue July of 2012   (3) on zoledronic acid started December of 2012, initially monthly, currently given every 3 months, most recent dose 01/28/2013  (4) low-dose lenalidomide resumed April 2013, interrupted several times since.  Resumed again on 02/19/2013, and a dose of 5 mg daily, 21 days on and 7 days off.  (5) CNS symptoms and abnormal brain MRI extensively evaluated by neurology with no definitive diagnosis established; improving   (6) rising M spike noted June 2014 but did not meet criteria for carfilzomib study  (7) on lenalidomide 25 mg daily, 14 days on, 7 days off, starting  04/18/2013, interrupted December 2014 because of rash; resumed January 2015 at 10 mg/ day 21 days on/ 7 days off   PLAN:  [name] was myeloma labs remain stable. While his lambda light chains to go up and down, the Is follow so his ratio generally remains normal. The main problem he has aside from the recent rash is constipation, and we have repeatedly reviewed how he can make sure not to have hard bowel movements, even if he occasionally and sub-having loose 1. At any rate colonoscopy earlier this week showed only internal hemorrhoids.  I am not sure the rash [name] developed is entirely attributable to the lenalidomide. In any case he has been off the medication now about 2 weeks and also received a steroid taper. The rash has improved but not resolved.  Symptomatically I have suggested he use of emollients. As far as myeloma treatment is concerned we are going to drop the lenalidomide dose to 10 mg daily and you'll be on that 3 weeks on, one week off. He a ready has an appointment with Dr. Alvie Heidelberg at [facility] next week. His next set of labs here will be the first week in February and I will see him the week after that. Diagnosed a call for any problems that may develop before that visit  Chauncey Cruel, MD     09/25/2013

## 2013-09-26 ENCOUNTER — Other Ambulatory Visit: Payer: Self-pay | Admitting: *Deleted

## 2013-09-30 ENCOUNTER — Encounter: Payer: Commercial Managed Care - PPO | Admitting: Internal Medicine

## 2013-10-01 NOTE — Progress Notes (Signed)
Patient was seen by Dr. [name] on 09/25/2013.  [name], PA-C 09/25/2013

## 2013-10-15 ENCOUNTER — Ambulatory Visit (INDEPENDENT_AMBULATORY_CARE_PROVIDER_SITE_OTHER): Payer: Commercial Managed Care - PPO | Admitting: Internal Medicine

## 2013-10-15 ENCOUNTER — Other Ambulatory Visit: Payer: Self-pay

## 2013-10-15 ENCOUNTER — Encounter: Payer: Self-pay | Admitting: Internal Medicine

## 2013-10-15 VITALS — BP 124/86 | HR 76 | Temp 97.5°F | Resp 16 | Wt 180.0 lb

## 2013-10-15 DIAGNOSIS — C9000 Multiple myeloma not having achieved remission: Secondary | ICD-10-CM

## 2013-10-15 DIAGNOSIS — J45909 Unspecified asthma, uncomplicated: Secondary | ICD-10-CM

## 2013-10-15 DIAGNOSIS — Z23 Encounter for immunization: Secondary | ICD-10-CM

## 2013-10-15 DIAGNOSIS — R5383 Other fatigue: Secondary | ICD-10-CM

## 2013-10-15 DIAGNOSIS — R5381 Other malaise: Secondary | ICD-10-CM

## 2013-10-15 DIAGNOSIS — G459 Transient cerebral ischemic attack, unspecified: Secondary | ICD-10-CM

## 2013-10-15 DIAGNOSIS — F4321 Adjustment disorder with depressed mood: Secondary | ICD-10-CM

## 2013-10-15 DIAGNOSIS — F329 Major depressive disorder, single episode, unspecified: Secondary | ICD-10-CM

## 2013-10-15 DIAGNOSIS — R21 Rash and other nonspecific skin eruption: Secondary | ICD-10-CM

## 2013-10-15 DIAGNOSIS — E039 Hypothyroidism, unspecified: Secondary | ICD-10-CM

## 2013-10-15 MED ORDER — LENALIDOMIDE 10 MG PO CAPS: ORAL_CAPSULE | ORAL | Status: DC

## 2013-10-15 MED ORDER — METHYLPREDNISOLONE ACETATE 80 MG/ML IJ SUSP
80.0000 mg | Freq: Once | INTRAMUSCULAR | Status: AC
Start: 2013-10-15 — End: 2013-10-15
  Administered 2013-10-15: 80 mg via INTRAMUSCULAR

## 2013-10-15 MED ORDER — AMLODIPINE BESYLATE 2.5 MG PO TABS: 2.5000 mg | ORAL_TABLET | Freq: Every day | ORAL | Status: DC

## 2013-10-15 NOTE — Assessment & Plan Note (Signed)
Continue with current prescription therapy as reflected on the Med list.  

## 2013-10-15 NOTE — Assessment & Plan Note (Addendum)
12/14 x2 mo - poss Revlimid related 1/15 better on a lower dose Depomedrol 80 mg im

## 2013-10-15 NOTE — Progress Notes (Signed)
Pre visit review using our clinic review tool, if applicable. No additional management support is needed unless otherwise documented below in the visit note. 

## 2013-10-15 NOTE — Assessment & Plan Note (Signed)
Better  

## 2013-10-15 NOTE — Telephone Encounter (Signed)
lvm that pt needs to take [employer] survey

## 2013-10-15 NOTE — Progress Notes (Signed)
Subjective:     HPI  F/u poor circulation - fingers are turning white in the cold - not better  Dr [name] has reduced his Revlimid dose from 25 to 10 mg F/u rash and itching x 5 mo - worse - better on Steroids F/u R CP after a fall - resolved  The patient  needs to address  chronic hypertension that has been well controlled with medicines; to address chronic fatigue, insomnia,  Hyperlipidemia  F/u  depression.  BP Readings from Last 3 Encounters:  10/15/13 124/86  09/25/13 117/68  09/23/13 107/59   Wt Readings from Last 3 Encounters:  10/15/13 180 lb (81.647 kg)  09/25/13 176 lb 12.8 oz (80.196 kg)  09/23/13 178 lb (80.74 kg)       Review of Systems  Constitutional: Positive for fatigue. Negative for diaphoresis, appetite change and unexpected weight change.  HENT: Negative for congestion, facial swelling, nosebleeds, sneezing, sore throat and trouble swallowing.   Eyes: Negative for itching and visual disturbance.  Respiratory: Negative for cough, chest tightness and wheezing.   Cardiovascular: Negative for chest pain, palpitations and leg swelling.  Gastrointestinal: Negative for nausea, vomiting, diarrhea, blood in stool and abdominal distention.  Genitourinary: Negative for dysuria, urgency, frequency and hematuria.  Musculoskeletal: Negative for back pain, gait problem, joint swelling and neck pain.  Skin: Positive for pallor and rash.  Neurological: Positive for weakness. Negative for dizziness, tremors and speech difficulty.  Psychiatric/Behavioral: Positive for dysphoric mood. Negative for suicidal ideas, sleep disturbance and agitation. The patient is nervous/anxious.        Objective:   Physical Exam  Constitutional: He is oriented to person, place, and time. He appears well-developed. No distress.  Appears tired  HENT:  Mouth/Throat: Oropharynx is clear and moist.  Eyes: Conjunctivae are normal. Pupils are equal, round, and reactive to light.  Neck:  Normal range of motion. No JVD present. No thyromegaly present.  Cardiovascular: Normal rate, regular rhythm, normal heart sounds and intact distal pulses.  Exam reveals no gallop and no friction rub.   No murmur heard. 4 fingertips are white and cool  Pulmonary/Chest: Effort normal and breath sounds normal. No respiratory distress. He has no wheezes. He has no rales. He exhibits no tenderness.  Abdominal: Soft. Bowel sounds are normal. He exhibits no distension and no mass. There is no tenderness. There is no rebound and no guarding.  Genitourinary: Rectum normal and prostate normal. Guaiac negative stool.  Musculoskeletal: Normal range of motion. He exhibits no edema and no tenderness.  Lymphadenopathy:    He has no cervical adenopathy.  Neurological: He is alert and oriented to person, place, and time. He has normal reflexes. No cranial nerve deficit. He exhibits normal muscle tone. Coordination normal.  Skin: Skin is warm and dry. Rash noted.  Residual faint rash on back  Psychiatric: He has a normal mood and affect. His behavior is normal. Judgment and thought content normal.    Lab Results  Component Value Date   WBC 3.1* 09/25/2013   HGB 11.1* 09/25/2013   HCT 33.5* 09/25/2013   PLT 84* 09/25/2013   GLUCOSE 78 09/17/2013   CHOL 211* 11/13/2012   TRIG 73.0 11/13/2012   HDL 44.70 11/13/2012   LDLDIRECT 155.8 11/13/2012   LDLCALC 84 03/28/2012   ALT 20 08/26/2013   AST 20 08/26/2013   NA 136 09/17/2013   K 3.5 09/17/2013   CL 103 09/17/2013   CREATININE 1.2 09/17/2013   BUN 19 09/17/2013  CO2 26 09/17/2013   TSH 0.08* 09/17/2013   PSA 0.95 06/16/2013   INR 0.91 03/31/2013   HGBA1C 5.4 03/27/2012          Assessment & Plan:

## 2013-10-16 ENCOUNTER — Other Ambulatory Visit: Payer: Self-pay | Admitting: *Deleted

## 2013-10-16 DIAGNOSIS — C9000 Multiple myeloma not having achieved remission: Secondary | ICD-10-CM

## 2013-10-16 MED ORDER — LENALIDOMIDE 10 MG PO CAPS: ORAL_CAPSULE | ORAL | Status: DC

## 2013-10-21 ENCOUNTER — Other Ambulatory Visit (HOSPITAL_BASED_OUTPATIENT_CLINIC_OR_DEPARTMENT_OTHER): Payer: Commercial Managed Care - PPO

## 2013-10-21 DIAGNOSIS — C9000 Multiple myeloma not having achieved remission: Secondary | ICD-10-CM

## 2013-10-21 LAB — CBC WITH DIFFERENTIAL/PLATELET
BASO%: 0.5 % (ref 0.0–2.0)
BASOS ABS: 0 10*3/uL (ref 0.0–0.1)
EOS%: 5.7 % (ref 0.0–7.0)
Eosinophils Absolute: 0.1 10*3/uL (ref 0.0–0.5)
HEMATOCRIT: 33.8 % — AB (ref 38.4–49.9)
HEMOGLOBIN: 11.4 g/dL — AB (ref 13.0–17.1)
LYMPH#: 1 10*3/uL (ref 0.9–3.3)
LYMPH%: 41.3 % (ref 14.0–49.0)
MCH: 34.6 pg — AB (ref 27.2–33.4)
MCHC: 33.7 g/dL (ref 32.0–36.0)
MCV: 102.5 fL — AB (ref 79.3–98.0)
MONO#: 0.4 10*3/uL (ref 0.1–0.9)
MONO%: 17 % — AB (ref 0.0–14.0)
NEUT#: 0.9 10*3/uL — ABNORMAL LOW (ref 1.5–6.5)
NEUT%: 35.5 % — AB (ref 39.0–75.0)
Platelets: 65 10*3/uL — ABNORMAL LOW (ref 140–400)
RBC: 3.3 10*6/uL — ABNORMAL LOW (ref 4.20–5.82)
RDW: 17.4 % — ABNORMAL HIGH (ref 11.0–14.6)
WBC: 2.5 10*3/uL — ABNORMAL LOW (ref 4.0–10.3)

## 2013-10-29 ENCOUNTER — Ambulatory Visit: Payer: Commercial Managed Care - PPO

## 2013-10-29 ENCOUNTER — Telehealth: Payer: Self-pay | Admitting: Oncology

## 2013-10-29 ENCOUNTER — Other Ambulatory Visit (HOSPITAL_BASED_OUTPATIENT_CLINIC_OR_DEPARTMENT_OTHER): Payer: Commercial Managed Care - PPO

## 2013-10-29 ENCOUNTER — Ambulatory Visit (HOSPITAL_BASED_OUTPATIENT_CLINIC_OR_DEPARTMENT_OTHER): Payer: Commercial Managed Care - PPO | Admitting: Oncology

## 2013-10-29 VITALS — BP 124/81 | HR 54 | Temp 97.5°F | Resp 20 | Ht 69.5 in | Wt 180.0 lb

## 2013-10-29 DIAGNOSIS — K279 Peptic ulcer, site unspecified, unspecified as acute or chronic, without hemorrhage or perforation: Secondary | ICD-10-CM

## 2013-10-29 DIAGNOSIS — J45909 Unspecified asthma, uncomplicated: Secondary | ICD-10-CM

## 2013-10-29 DIAGNOSIS — C9000 Multiple myeloma not having achieved remission: Secondary | ICD-10-CM

## 2013-10-29 DIAGNOSIS — D649 Anemia, unspecified: Secondary | ICD-10-CM

## 2013-10-29 DIAGNOSIS — E039 Hypothyroidism, unspecified: Secondary | ICD-10-CM

## 2013-10-29 LAB — CBC WITH DIFFERENTIAL/PLATELET
BASO%: 1 % (ref 0.0–2.0)
Basophils Absolute: 0 10*3/uL (ref 0.0–0.1)
EOS ABS: 0.1 10*3/uL (ref 0.0–0.5)
EOS%: 4.5 % (ref 0.0–7.0)
HEMATOCRIT: 35.2 % — AB (ref 38.4–49.9)
HEMOGLOBIN: 11.5 g/dL — AB (ref 13.0–17.1)
LYMPH#: 1.2 10*3/uL (ref 0.9–3.3)
LYMPH%: 40.8 % (ref 14.0–49.0)
MCH: 33.9 pg — ABNORMAL HIGH (ref 27.2–33.4)
MCHC: 32.8 g/dL (ref 32.0–36.0)
MCV: 103.5 fL — ABNORMAL HIGH (ref 79.3–98.0)
MONO#: 0.4 10*3/uL (ref 0.1–0.9)
MONO%: 13.6 % (ref 0.0–14.0)
NEUT%: 40.1 % (ref 39.0–75.0)
NEUTROS ABS: 1.1 10*3/uL — AB (ref 1.5–6.5)
Platelets: 61 10*3/uL — ABNORMAL LOW (ref 140–400)
RBC: 3.4 10*6/uL — ABNORMAL LOW (ref 4.20–5.82)
RDW: 16.9 % — ABNORMAL HIGH (ref 11.0–14.6)
WBC: 2.8 10*3/uL — AB (ref 4.0–10.3)

## 2013-10-29 LAB — PROTEIN / CREATININE RATIO, URINE
Creatinine, Urine: 66.2 mg/dL
Protein Creatinine Ratio: 0.05 (ref <0.15)
Total Protein, Urine: 3 mg/dL

## 2013-10-29 NOTE — Telephone Encounter (Signed)
, °

## 2013-10-29 NOTE — Progress Notes (Signed)
ID: AVEN CHRISTEN   DOB: Aug 15, 1951  MR#: [id number]  JZP#:[id number]   PCP: Cristie Hem [name] SU: OTHER MD: [name], [name], Shanon Rosser  CHIEF COMPLAINT: Multiple myeloma   HISTORY OF PRESENT ILLNESS: The patient was worked up for peptic ulcer disease in August of 2011, with significant bleeding and anemia.  The patient was Helicobacter pylori negative.  He received some epinephrine when he had his EGD and then started on Protonix.  [name] anemia slowly resolved so that by September, his hemoglobin was up to 10.9 and by earlier this month, his hemoglobin was up to 12.5.    As part of his general workup, he was found to have a slightly elevated globulin fraction.  In September, his total protein was 8.3 with an albumin of 3.8.  In January, the total protein was 8.4 and albumin 3.6.  With persistence of this slight abnormality, Dr. Olevia Perches obtained serum immunofixation and SPEP.  The SPTP showed an M-spike of 2.67 grams.  A total IgG was 4,190.  Total IgA low at 28.  Total IgM low at 28 also.  The immunofixation showed a monoclonal IgG lambda paraprotein.  There were also monoclonal free lambda light chains present. With this information, the patient was referred for further evaluation.   A diagnosis of myeloma was confirmed by bone marrow biopsy and subsequebnt treatment is as detailed below  INTERVAL HISTORY: Andre [name] returns today for followup of his multiple myeloma. The interval history is generally unremarkable. He continues on lenalidomide 2 weeks on and one-week off. He appears to be tolerating this well. He has mild constipation, which is a chronic problem with him, and 1 we have discussed multiple times (for some reason he is very resistance to a routine bowel prophylaxis regimen). There is only very minimal grade 1 neuropathy, unchanged from baseline   REVIEW OF SYSTEMS: Andre [name] t feels "tired", but is training for a half marathon. He does 3 mild sinus  treadmill is almost everyday. He is about to get back into swimming. Sometimes he gets cramps in his feet or cats. This can be painful. He got himself some potassium pills and that is helping. He is also trying to hydrate himself well. When he gets very cold his fingers can turn white (not blue). He was started on amlodipine for this problem.  He complains of easy bruising. A detailed review of systems today was otherwise noncontributory  PAST MEDICAL HISTORY: Past Medical History  Diagnosis Date  . Hypertension   . Hyperlipidemia   . Duodenal ulcer   . Multiple myeloma 07/04/2011  . Allergy   . Thyroid disease   . GERD (gastroesophageal reflux disease)   . Depression   . Anemia   Significant for peptic ulcer disease as noted above.  History of hyperlipidemia.  History of anxiety and depression.  History of GERD which is significantly improved with weight loss and history of reactive airway disease, possibly secondary to the GERD which also improved with weight loss.  There was a history of obesity, now much improved secondary to exercise and diet.   PAST SURGICAL HISTORY: Past Surgical History  Procedure Laterality Date  . Cardiolite study  11/25/2003    NORMAL  . Bone marrow transplant  2011    for MM  . Tonsillectomy      FAMILY HISTORY Family History  Problem Relation Age of Onset  . Throat cancer Mother   . Hypertension Father   . Stroke Father   .  Asthma Father   . Diabetes Father   . Pancreatic cancer Brother   [name] father died at the age of 66 following a stroke.  [name] mother died with cancer of the throat at the age of 17.   She had a history of depression and was a smoker.  Not clear how much alcohol she drank, according to the patient.  The patient had two brothers.  One died from the age of 61 from a "kidney problem."  The second one died at the age of 71 from pancreatic cancer. (This may have been duodenal cancer. 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  His wife of 37+ years was Andre [name], but they are now divorced.   The patient has two daughters:  Andre [name] who is 85, and Andre [name] who is 81.  They both live here in [place]. 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  The patient is very close to his daughters.  Andre [name] has one daughter, Andre [name], born 03/06/2013, apparently with Weber/Osler/Rendu The patient is not a church attender.  He does derive a great deal of support from friends at the "[employer]" which he attends very regularly, and also participates in Al-Anon even though actually there is no alcohol or drug problem in the family.  He participates in this group and he gets quite a bit of support from it.      ADVANCED DIRECTIVES: In place  HEALTH MAINTENANCE: (updated 07/16/2013) History  Substance Use Topics  . Smoking status: Former Smoker -- 3 years    Quit date: 03/27/1969  . Smokeless tobacco: Never Used  . Alcohol Use: No     Colonoscopy:  PSA:  Sept 2014, normal/Dr. [name]  Lipid panel: Sept 2014/Dr. [name]   Allergies  Allergen Reactions  . Crestor [Rosuvastatin Calcium]     ADVERSE EFFECTS ON LIVER  . Lipitor [Atorvastatin Calcium]     Current Outpatient Prescriptions  Medication Sig Dispense Refill  . amLODipine (NORVASC) 2.5 MG tablet Take 1 tablet (2.5 mg total) by mouth daily.  30 tablet  5  . aspirin EC 81 MG tablet Take 81 mg by mouth every morning.      . Bisacodyl (DULCOLAX PO) Take 1 tablet by mouth.      . cetirizine (ZYRTEC) 10 MG tablet Take 10 mg by mouth daily.      . cholecalciferol (VITAMIN D) 1000 UNITS tablet Take 1,000 Units by mouth every morning.      . diphenhydrAMINE (BENADRYL) 25 MG tablet Take 25 mg by mouth as needed for itching.      . diphenhydramine-acetaminophen  (TYLENOL PM) 25-500 MG TABS Take 1 tablet by mouth at bedtime.       [name] escitalopram (LEXAPRO) 10 MG tablet take 1 tablet by mouth once daily  30 tablet  11  . hydrocortisone (ANUSOL-HC) 25 MG suppository Place 1 suppository (25 mg total) rectally every 12 (twelve) hours.  10 suppository  1  . hydrocortisone (ANUSOL-HC) 25 MG suppository Place 1 suppository (25 mg total) rectally at bedtime as needed for hemorrhoids.  12 suppository  1  . ibuprofen (ADVIL,MOTRIN) 200 MG tablet Take 400 mg by mouth every 8 (eight) hours as needed for pain.       [name] lenalidomide (REVLIMID) 10 MG capsule  Take one capsule daily for 21 days, then off for 7 days, then repeat  21 capsule  0  . levothyroxine (SYNTHROID, LEVOTHROID) 150 MCG tablet Take 1 tablet (150 mcg total) by mouth daily.  90 tablet  3  . mupirocin ointment (BACTROBAN) 2 % Place small amount into each nostril twice daily x 5 days.  After application, press sides of nose together and gently massage.  22 g  0  . omeprazole (PRILOSEC) 40 MG capsule Take 1 capsule (40 mg total) by mouth every morning.  30 capsule  4  . polyethylene glycol (MIRALAX / GLYCOLAX) packet Take 17 g by mouth as needed.       . [DISCONTINUED] fexofenadine (ALLEGRA) 60 MG tablet Take 180 mg by mouth daily.         No current facility-administered medications for this visit.    OBJECTIVE: Middle-aged white man in no acute distress Filed Vitals:   10/29/13 1154  BP: 124/81  Pulse: 54  Temp: 97.5 F (36.4 C)  Resp: 20     Body mass index is 26.21 kg/(m^2).    ECOG FS: 1 Filed Weights   10/29/13 1154  Weight: 180 lb (81.647 kg)   Physical Exam: Pupils were equal and round Oropharynx no thrush, teeth in good repair There is no cervical or supraclavicular adenopathy Heart regular rate and rhythm Abdomen soft, nontender, positive bowel sounds No peripheral edema Neuro nonfocal, well-oriented, appropriate affect  LAB RESULTS:  Lab work obtained at [facility] 10/01/2013 showed a  A light chains at 2.49, lambda at 4.28, with a ratio of 0.58. Total IgG was 1070, IgA to 39 and IgM 34. The IgE was 3220. The M spike on the SPEP was 0.25. Beta-2 microglobulin was 3.26.    Lab Results  Component Value Date   WBC 2.8* 10/29/2013   NEUTROABS 1.1* 10/29/2013   HGB 11.5* 10/29/2013   HCT 35.2* 10/29/2013   MCV 103.5* 10/29/2013   PLT 61* 10/29/2013      Chemistry      Component Value Date/Time   NA 136 09/17/2013 0940   NA 138 08/26/2013 1016   K 3.5 09/17/2013 0940   K 4.5 08/26/2013 1016   CL 103 09/17/2013 0940   CL 107 03/10/2013 1352   CO2 26 09/17/2013 0940   CO2 23 08/26/2013 1016   BUN 19 09/17/2013 0940   BUN 17.2 08/26/2013 1016   CREATININE 1.2 09/17/2013 0940   CREATININE 1.0 08/26/2013 1016      Component Value Date/Time   CALCIUM 8.7 09/17/2013 0940   CALCIUM 8.8 08/26/2013 1016   ALKPHOS 81 08/26/2013 1016   ALKPHOS 62 06/16/2013 1547   AST 20 08/26/2013 1016   AST 26 06/16/2013 1547   ALT 20 08/26/2013 1016   ALT 23 06/16/2013 1547   BILITOT 0.51 08/26/2013 1016   BILITOT 0.5 06/16/2013 1547      STUDIES: No results found. ASSESSMENT: 63 y.o.  [place] man with a history of multiple myeloma diagnosed February of 2012, with an initial M-spike of 2.6 g/dL, IFE showing an IgG lambda paraprotein and free lambda light chains in the urine. Cytogenetics showed trisomy 62. Bone marrow biopsy showed a 22% plasmacytosis. Treated with   (1) bortezomib (subcutaneously), lenalidomide, and dexamethasone, with repeat bone marrow biopsy May of 2012 showing 10% plasmacytosis   (2) high-dose chemotherapy with BCNU and melphalan at [facility], followed by stem cell rescue July of 2012   (3) on zoledronic acid started December of 2012,  initially monthly, currently given every 3 months, most recent dose 06/10/2013  (4) low-dose lenalidomide resumed April 2013, interrupted several times since.  Resumed again on 02/19/2013, and a dose of 5 mg daily, 21 days on and 7 days off.  (5)  CNS symptoms and abnormal brain MRI extensively evaluated by neurology with no definitive diagnosis established; improving   (6) rising M spike noted June 2014 but did not meet criteria for carfilzomib study  (7) on lenalidomide 25 mg daily, 14 days on, 7 days off, starting 04/18/2013, interrupted December 2014 because of rash; resumed January 2015 at 10 mg/ day 21 days on/ 7 days off   PLAN:  Andre [name] is tolerating the lenalidomide at a lower dose much better. His myeloma appears to continue under control. We are going to start checking labs every 2 weeks, just to make sure he does not become significantly anemic or thrombocytopenic. We will follow his protein studies on a monthly basis.  We are behind on his alendronate and we will schedule that for repeat in March.  He will see me again early April. He may return to [facility] in June or otherwise see [place] that month as well. Andre [name] has a good understanding of the overall plan. He agrees with that. He knows the goal of treatment is control. He will call with any problems that may develop before the next visit here.  Chauncey Cruel, MD     10/29/2013

## 2013-11-03 ENCOUNTER — Telehealth: Payer: Self-pay | Admitting: Oncology

## 2013-11-03 LAB — PROTEIN ELECTROPHORESIS, SERUM
ALPHA-1-GLOBULIN: 4.1 % (ref 2.9–4.9)
ALPHA-2-GLOBULIN: 10 % (ref 7.1–11.8)
Albumin ELP: 57.5 % (ref 55.8–66.1)
Beta 2: 4.5 % (ref 3.2–6.5)
Beta Globulin: 7.7 % — ABNORMAL HIGH (ref 4.7–7.2)
GAMMA GLOBULIN: 16.2 % (ref 11.1–18.8)
M-Spike, %: 0.48 g/dL
Total Protein, Serum Electrophoresis: 7.2 g/dL (ref 6.0–8.3)

## 2013-11-03 LAB — KAPPA/LAMBDA LIGHT CHAINS
KAPPA FREE LGHT CHN: 3.55 mg/dL — AB (ref 0.33–1.94)
Kappa:Lambda Ratio: 0.78 (ref 0.26–1.65)
Lambda Free Lght Chn: 4.57 mg/dL — ABNORMAL HIGH (ref 0.57–2.63)

## 2013-11-03 LAB — BETA 2 MICROGLOBULIN, SERUM: BETA 2 MICROGLOBULIN: 2.34 mg/L — AB (ref 1.01–1.73)

## 2013-11-03 NOTE — Telephone Encounter (Signed)
s.w. pt and advised on all appt...pt ok and aware °

## 2013-11-07 ENCOUNTER — Other Ambulatory Visit: Payer: Self-pay | Admitting: *Deleted

## 2013-11-07 NOTE — Telephone Encounter (Signed)
THIS REFILL REQUEST FOR REVLIMID WAS PLACED ON DR.MAGRINAT'S NURSE'S DESK.

## 2013-11-10 ENCOUNTER — Encounter: Payer: Self-pay | Admitting: Oncology

## 2013-11-10 ENCOUNTER — Other Ambulatory Visit: Payer: Self-pay | Admitting: *Deleted

## 2013-11-10 DIAGNOSIS — C9000 Multiple myeloma not having achieved remission: Secondary | ICD-10-CM

## 2013-11-10 MED ORDER — LENALIDOMIDE 10 MG PO CAPS: ORAL_CAPSULE | ORAL | Status: DC

## 2013-11-12 ENCOUNTER — Telehealth: Payer: Self-pay | Admitting: Physician Assistant

## 2013-11-12 NOTE — Telephone Encounter (Signed)
I have fixed the patient's appts. I have sent him an email with new appts

## 2013-11-12 NOTE — Telephone Encounter (Signed)
, °

## 2013-11-13 ENCOUNTER — Other Ambulatory Visit: Payer: Commercial Managed Care - PPO

## 2013-11-17 ENCOUNTER — Other Ambulatory Visit (HOSPITAL_BASED_OUTPATIENT_CLINIC_OR_DEPARTMENT_OTHER): Payer: Commercial Managed Care - PPO

## 2013-11-17 DIAGNOSIS — K279 Peptic ulcer, site unspecified, unspecified as acute or chronic, without hemorrhage or perforation: Secondary | ICD-10-CM

## 2013-11-17 DIAGNOSIS — E039 Hypothyroidism, unspecified: Secondary | ICD-10-CM

## 2013-11-17 DIAGNOSIS — J45909 Unspecified asthma, uncomplicated: Secondary | ICD-10-CM

## 2013-11-17 DIAGNOSIS — D649 Anemia, unspecified: Secondary | ICD-10-CM

## 2013-11-17 DIAGNOSIS — C9000 Multiple myeloma not having achieved remission: Secondary | ICD-10-CM

## 2013-11-17 LAB — COMPREHENSIVE METABOLIC PANEL (CC13)
ALT: 16 U/L (ref 0–55)
ANION GAP: 5 meq/L (ref 3–11)
AST: 16 U/L (ref 5–34)
Albumin: 3.4 g/dL — ABNORMAL LOW (ref 3.5–5.0)
Alkaline Phosphatase: 75 U/L (ref 40–150)
BUN: 15.3 mg/dL (ref 7.0–26.0)
CO2: 24 meq/L (ref 22–29)
Calcium: 9.2 mg/dL (ref 8.4–10.4)
Chloride: 108 mEq/L (ref 98–109)
Creatinine: 1 mg/dL (ref 0.7–1.3)
GLUCOSE: 85 mg/dL (ref 70–140)
Potassium: 3.7 mEq/L (ref 3.5–5.1)
SODIUM: 137 meq/L (ref 136–145)
Total Bilirubin: 0.35 mg/dL (ref 0.20–1.20)
Total Protein: 6.8 g/dL (ref 6.4–8.3)

## 2013-11-17 LAB — CBC WITH DIFFERENTIAL/PLATELET
BASO%: 0.9 % (ref 0.0–2.0)
BASOS ABS: 0 10*3/uL (ref 0.0–0.1)
EOS%: 17.8 % — ABNORMAL HIGH (ref 0.0–7.0)
Eosinophils Absolute: 0.5 10*3/uL (ref 0.0–0.5)
HEMATOCRIT: 33.9 % — AB (ref 38.4–49.9)
HGB: 11.6 g/dL — ABNORMAL LOW (ref 13.0–17.1)
LYMPH#: 0.9 10*3/uL (ref 0.9–3.3)
LYMPH%: 32.9 % (ref 14.0–49.0)
MCH: 35 pg — ABNORMAL HIGH (ref 27.2–33.4)
MCHC: 34.2 g/dL (ref 32.0–36.0)
MCV: 102.3 fL — ABNORMAL HIGH (ref 79.3–98.0)
MONO#: 0.4 10*3/uL (ref 0.1–0.9)
MONO%: 13.8 % (ref 0.0–14.0)
NEUT#: 0.9 10*3/uL — ABNORMAL LOW (ref 1.5–6.5)
NEUT%: 34.6 % — ABNORMAL LOW (ref 39.0–75.0)
Platelets: 76 10*3/uL — ABNORMAL LOW (ref 140–400)
RBC: 3.31 10*6/uL — ABNORMAL LOW (ref 4.20–5.82)
RDW: 16.9 % — ABNORMAL HIGH (ref 11.0–14.6)
WBC: 2.7 10*3/uL — AB (ref 4.0–10.3)

## 2013-11-20 LAB — PROTEIN ELECTROPHORESIS, SERUM
ALPHA-1-GLOBULIN: 4.5 % (ref 2.9–4.9)
ALPHA-2-GLOBULIN: 10.5 % (ref 7.1–11.8)
Albumin ELP: 56.8 % (ref 55.8–66.1)
Beta 2: 4.3 % (ref 3.2–6.5)
Beta Globulin: 7.4 % — ABNORMAL HIGH (ref 4.7–7.2)
Gamma Globulin: 16.5 % (ref 11.1–18.8)
M-Spike, %: 0.44 g/dL
TOTAL PROTEIN, SERUM ELECTROPHOR: 6.5 g/dL (ref 6.0–8.3)

## 2013-11-20 LAB — KAPPA/LAMBDA LIGHT CHAINS
KAPPA LAMBDA RATIO: 1.03 (ref 0.26–1.65)
Kappa free light chain: 5.03 mg/dL — ABNORMAL HIGH (ref 0.33–1.94)
Lambda Free Lght Chn: 4.88 mg/dL — ABNORMAL HIGH (ref 0.57–2.63)

## 2013-11-27 ENCOUNTER — Other Ambulatory Visit: Payer: Commercial Managed Care - PPO

## 2013-11-27 ENCOUNTER — Ambulatory Visit: Payer: Commercial Managed Care - PPO

## 2013-11-28 ENCOUNTER — Ambulatory Visit (HOSPITAL_BASED_OUTPATIENT_CLINIC_OR_DEPARTMENT_OTHER): Payer: Commercial Managed Care - PPO

## 2013-11-28 ENCOUNTER — Other Ambulatory Visit (HOSPITAL_BASED_OUTPATIENT_CLINIC_OR_DEPARTMENT_OTHER): Payer: Commercial Managed Care - PPO

## 2013-11-28 VITALS — BP 108/73 | HR 54 | Temp 97.7°F | Resp 18

## 2013-11-28 DIAGNOSIS — C9000 Multiple myeloma not having achieved remission: Secondary | ICD-10-CM

## 2013-11-28 DIAGNOSIS — E039 Hypothyroidism, unspecified: Secondary | ICD-10-CM

## 2013-11-28 DIAGNOSIS — K279 Peptic ulcer, site unspecified, unspecified as acute or chronic, without hemorrhage or perforation: Secondary | ICD-10-CM

## 2013-11-28 DIAGNOSIS — J45909 Unspecified asthma, uncomplicated: Secondary | ICD-10-CM

## 2013-11-28 DIAGNOSIS — D649 Anemia, unspecified: Secondary | ICD-10-CM

## 2013-11-28 LAB — CBC WITH DIFFERENTIAL/PLATELET
BASO%: 0.2 % (ref 0.0–2.0)
Basophils Absolute: 0 10*3/uL (ref 0.0–0.1)
EOS%: 6.8 % (ref 0.0–7.0)
Eosinophils Absolute: 0.2 10*3/uL (ref 0.0–0.5)
HCT: 33.8 % — ABNORMAL LOW (ref 38.4–49.9)
HGB: 11.3 g/dL — ABNORMAL LOW (ref 13.0–17.1)
LYMPH%: 31.4 % (ref 14.0–49.0)
MCH: 34.1 pg — AB (ref 27.2–33.4)
MCHC: 33.5 g/dL (ref 32.0–36.0)
MCV: 101.7 fL — ABNORMAL HIGH (ref 79.3–98.0)
MONO#: 0.5 10*3/uL (ref 0.1–0.9)
MONO%: 18.5 % — AB (ref 0.0–14.0)
NEUT#: 1.2 10*3/uL — ABNORMAL LOW (ref 1.5–6.5)
NEUT%: 43.1 % (ref 39.0–75.0)
Platelets: 78 10*3/uL — ABNORMAL LOW (ref 140–400)
RBC: 3.32 10*6/uL — AB (ref 4.20–5.82)
RDW: 16.4 % — ABNORMAL HIGH (ref 11.0–14.6)
WBC: 2.8 10*3/uL — AB (ref 4.0–10.3)
lymph#: 0.9 10*3/uL (ref 0.9–3.3)

## 2013-11-28 LAB — COMPREHENSIVE METABOLIC PANEL (CC13)
ALBUMIN: 3.4 g/dL — AB (ref 3.5–5.0)
ALT: 18 U/L (ref 0–55)
AST: 16 U/L (ref 5–34)
Alkaline Phosphatase: 82 U/L (ref 40–150)
Anion Gap: 10 mEq/L (ref 3–11)
BUN: 16.6 mg/dL (ref 7.0–26.0)
CO2: 21 mEq/L — ABNORMAL LOW (ref 22–29)
Calcium: 9.7 mg/dL (ref 8.4–10.4)
Chloride: 108 mEq/L (ref 98–109)
Creatinine: 1 mg/dL (ref 0.7–1.3)
Glucose: 103 mg/dl (ref 70–140)
POTASSIUM: 4 meq/L (ref 3.5–5.1)
Sodium: 139 mEq/L (ref 136–145)
Total Bilirubin: 0.29 mg/dL (ref 0.20–1.20)
Total Protein: 6.8 g/dL (ref 6.4–8.3)

## 2013-11-28 LAB — PROTEIN / CREATININE RATIO, URINE
CREATININE, URINE: 119.1 mg/dL
Protein Creatinine Ratio: 0.1 (ref <0.15)
TOTAL PROTEIN, URINE: 12 mg/dL

## 2013-11-28 MED ORDER — ZOLEDRONIC ACID 4 MG/100ML IV SOLN
4.0000 mg | Freq: Once | INTRAVENOUS | Status: AC
  Administered 2013-11-28: 4 mg via INTRAVENOUS
  Filled 2013-11-28: qty 100

## 2013-11-28 NOTE — Patient Instructions (Signed)

## 2013-12-02 LAB — PROTEIN ELECTROPHORESIS, SERUM
ALBUMIN ELP: 56.6 % (ref 55.8–66.1)
Alpha-1-Globulin: 4.4 % (ref 2.9–4.9)
Alpha-2-Globulin: 10.7 % (ref 7.1–11.8)
Beta 2: 4.5 % (ref 3.2–6.5)
Beta Globulin: 7.6 % — ABNORMAL HIGH (ref 4.7–7.2)
Gamma Globulin: 16.2 % (ref 11.1–18.8)
M-SPIKE, %: 0.43 g/dL
Total Protein, Serum Electrophoresis: 6.1 g/dL (ref 6.0–8.3)

## 2013-12-02 LAB — KAPPA/LAMBDA LIGHT CHAINS
KAPPA LAMBDA RATIO: 1.02 (ref 0.26–1.65)
Kappa free light chain: 4.01 mg/dL — ABNORMAL HIGH (ref 0.33–1.94)
Lambda Free Lght Chn: 3.95 mg/dL — ABNORMAL HIGH (ref 0.57–2.63)

## 2013-12-03 ENCOUNTER — Other Ambulatory Visit: Payer: Self-pay | Admitting: Physician Assistant

## 2013-12-03 ENCOUNTER — Telehealth: Payer: Self-pay | Admitting: Oncology

## 2013-12-03 NOTE — Telephone Encounter (Signed)
S/w the pt and he is aware of his April appts. °

## 2013-12-08 ENCOUNTER — Other Ambulatory Visit: Payer: Self-pay | Admitting: Internal Medicine

## 2013-12-08 ENCOUNTER — Encounter: Payer: Self-pay | Admitting: Internal Medicine

## 2013-12-08 MED ORDER — TADALAFIL 20 MG PO TABS
10.0000 mg | ORAL_TABLET | Freq: Every day | ORAL | Status: DC | PRN
Start: 2013-12-08 — End: 2014-07-03

## 2013-12-09 ENCOUNTER — Other Ambulatory Visit: Payer: Self-pay | Admitting: *Deleted

## 2013-12-09 DIAGNOSIS — C9000 Multiple myeloma not having achieved remission: Secondary | ICD-10-CM

## 2013-12-09 MED ORDER — LENALIDOMIDE 10 MG PO CAPS: ORAL_CAPSULE | ORAL | Status: DC

## 2013-12-09 NOTE — Telephone Encounter (Signed)
THIS REFILL REQUEST FOR REVLIMID WAS GIVEN TO DR.MAGRINAT'S NURSE, VAL DODD,RN. 

## 2013-12-11 ENCOUNTER — Other Ambulatory Visit (HOSPITAL_BASED_OUTPATIENT_CLINIC_OR_DEPARTMENT_OTHER): Payer: Commercial Managed Care - PPO

## 2013-12-11 ENCOUNTER — Other Ambulatory Visit: Payer: Self-pay | Admitting: *Deleted

## 2013-12-11 DIAGNOSIS — C9000 Multiple myeloma not having achieved remission: Secondary | ICD-10-CM

## 2013-12-11 LAB — CBC WITH DIFFERENTIAL/PLATELET
BASO%: 1.1 % (ref 0.0–2.0)
BASOS ABS: 0 10*3/uL (ref 0.0–0.1)
EOS%: 14.2 % — ABNORMAL HIGH (ref 0.0–7.0)
Eosinophils Absolute: 0.5 10*3/uL (ref 0.0–0.5)
HCT: 32.6 % — ABNORMAL LOW (ref 38.4–49.9)
HGB: 10.8 g/dL — ABNORMAL LOW (ref 13.0–17.1)
LYMPH%: 24.3 % (ref 14.0–49.0)
MCH: 33.7 pg — ABNORMAL HIGH (ref 27.2–33.4)
MCHC: 33.1 g/dL (ref 32.0–36.0)
MCV: 101.7 fL — AB (ref 79.3–98.0)
MONO#: 0.4 10*3/uL (ref 0.1–0.9)
MONO%: 12.5 % (ref 0.0–14.0)
NEUT#: 1.6 10*3/uL (ref 1.5–6.5)
NEUT%: 47.9 % (ref 39.0–75.0)
PLATELETS: 80 10*3/uL — AB (ref 140–400)
RBC: 3.2 10*6/uL — AB (ref 4.20–5.82)
RDW: 16.7 % — ABNORMAL HIGH (ref 11.0–14.6)
WBC: 3.3 10*3/uL — ABNORMAL LOW (ref 4.0–10.3)
lymph#: 0.8 10*3/uL — ABNORMAL LOW (ref 0.9–3.3)

## 2013-12-15 ENCOUNTER — Other Ambulatory Visit: Payer: Self-pay | Admitting: Internal Medicine

## 2013-12-25 ENCOUNTER — Ambulatory Visit (HOSPITAL_BASED_OUTPATIENT_CLINIC_OR_DEPARTMENT_OTHER): Payer: Commercial Managed Care - PPO | Admitting: Oncology

## 2013-12-25 ENCOUNTER — Telehealth: Payer: Self-pay | Admitting: Oncology

## 2013-12-25 ENCOUNTER — Other Ambulatory Visit (HOSPITAL_BASED_OUTPATIENT_CLINIC_OR_DEPARTMENT_OTHER): Payer: Commercial Managed Care - PPO

## 2013-12-25 ENCOUNTER — Ambulatory Visit: Payer: Commercial Managed Care - PPO | Admitting: Physician Assistant

## 2013-12-25 ENCOUNTER — Telehealth: Payer: Self-pay | Admitting: *Deleted

## 2013-12-25 VITALS — BP 113/74 | HR 59 | Temp 97.7°F | Resp 18 | Ht 69.5 in | Wt 181.7 lb

## 2013-12-25 DIAGNOSIS — D649 Anemia, unspecified: Secondary | ICD-10-CM

## 2013-12-25 DIAGNOSIS — J45909 Unspecified asthma, uncomplicated: Secondary | ICD-10-CM

## 2013-12-25 DIAGNOSIS — K279 Peptic ulcer, site unspecified, unspecified as acute or chronic, without hemorrhage or perforation: Secondary | ICD-10-CM

## 2013-12-25 DIAGNOSIS — E039 Hypothyroidism, unspecified: Secondary | ICD-10-CM

## 2013-12-25 DIAGNOSIS — C9000 Multiple myeloma not having achieved remission: Secondary | ICD-10-CM

## 2013-12-25 DIAGNOSIS — D61818 Other pancytopenia: Secondary | ICD-10-CM

## 2013-12-25 LAB — COMPREHENSIVE METABOLIC PANEL (CC13)
ALBUMIN: 3.3 g/dL — AB (ref 3.5–5.0)
ALT: 11 U/L (ref 0–55)
AST: 16 U/L (ref 5–34)
Alkaline Phosphatase: 68 U/L (ref 40–150)
Anion Gap: 8 mEq/L (ref 3–11)
BILIRUBIN TOTAL: 0.37 mg/dL (ref 0.20–1.20)
BUN: 22.5 mg/dL (ref 7.0–26.0)
CO2: 23 mEq/L (ref 22–29)
Calcium: 9.2 mg/dL (ref 8.4–10.4)
Chloride: 110 mEq/L — ABNORMAL HIGH (ref 98–109)
Creatinine: 1.2 mg/dL (ref 0.7–1.3)
Glucose: 100 mg/dl (ref 70–140)
POTASSIUM: 4.2 meq/L (ref 3.5–5.1)
Sodium: 141 mEq/L (ref 136–145)
Total Protein: 6.4 g/dL (ref 6.4–8.3)

## 2013-12-25 LAB — PROTEIN / CREATININE RATIO, URINE
Creatinine, Urine: 139.9 mg/dL
PROTEIN CREATININE RATIO: 0.04 (ref <0.15)
TOTAL PROTEIN, URINE: 6 mg/dL

## 2013-12-25 LAB — CBC WITH DIFFERENTIAL/PLATELET
BASO%: 1.7 % (ref 0.0–2.0)
BASOS ABS: 0 10*3/uL (ref 0.0–0.1)
EOS%: 5.9 % (ref 0.0–7.0)
Eosinophils Absolute: 0.2 10*3/uL (ref 0.0–0.5)
HEMATOCRIT: 31.8 % — AB (ref 38.4–49.9)
HGB: 10.6 g/dL — ABNORMAL LOW (ref 13.0–17.1)
LYMPH%: 33.5 % (ref 14.0–49.0)
MCH: 33.5 pg — AB (ref 27.2–33.4)
MCHC: 33.4 g/dL (ref 32.0–36.0)
MCV: 100.4 fL — AB (ref 79.3–98.0)
MONO#: 0.4 10*3/uL (ref 0.1–0.9)
MONO%: 16.6 % — AB (ref 0.0–14.0)
NEUT%: 42.3 % (ref 39.0–75.0)
NEUTROS ABS: 1.1 10*3/uL — AB (ref 1.5–6.5)
Platelets: 69 10*3/uL — ABNORMAL LOW (ref 140–400)
RBC: 3.17 10*6/uL — ABNORMAL LOW (ref 4.20–5.82)
RDW: 16.3 % — ABNORMAL HIGH (ref 11.0–14.6)
WBC: 2.6 10*3/uL — ABNORMAL LOW (ref 4.0–10.3)
lymph#: 0.9 10*3/uL (ref 0.9–3.3)

## 2013-12-25 NOTE — Progress Notes (Signed)
ID: Andre [name]   DOB: July 24, 1951  MR#: [id number]  CSN#:[id number]   PCP: [name] SU: OTHER MD: [name], [name], Shanon Rosser  CHIEF COMPLAINT: Multiple myeloma   HISTORY OF PRESENT ILLNESS: The patient was worked up for peptic ulcer disease in August of 2011, with significant bleeding and anemia.  The patient was Helicobacter pylori negative.  He received some epinephrine when he had his EGD and then started on Protonix.  [name] anemia slowly resolved so that by September, his hemoglobin was up to 10.9 and by earlier this month, his hemoglobin was up to 12.5.    As part of his general workup, he was found to have a slightly elevated globulin fraction.  In September, his total protein was 8.3 with an albumin of 3.8.  In January, the total protein was 8.4 and albumin 3.6.  With persistence of this slight abnormality, Dr. Olevia Perches obtained serum immunofixation and SPEP.  The SPTP showed an M-spike of 2.67 grams.  A total IgG was 4,190.  Total IgA low at 28.  Total IgM low at 28 also.  The immunofixation showed a monoclonal IgG lambda paraprotein.  There were also monoclonal free lambda light chains present. With this information, the patient was referred for further evaluation.   A diagnosis of myeloma was confirmed by bone marrow biopsy and subsequebnt treatment is as detailed below  INTERVAL HISTORY: Andre [name] returns today for followup of his multiple myeloma. The interval history is generally unremarkable. He is working full-time. His daughters have sold their shop and now Olden is home with her baby and [name] is working in Personal assistant. Andre [name] has met a very nice lady who lives in [place] and this is stimulating to him. He continues on lenalidomide 2 weeks on and one-week off at 10 mg a day.   REVIEW OF SYSTEMS: Andre [name] reports no neuropathy symptoms. He remains moderately constipated. He does have a bowel movement every day and he says it is  not hard he just asked to "sit there for 10 or 12 minutes". He is now on 3 stool softeners a day but never did start the MiraLAX. He describes himself as tired particularly towards the end of the 3-week treatment cycle. He denies rash, bleeding, fever, or other systemic symptoms. He has cramps sometimes in the morning. He does not know much fluid he is drinking during the day but understands a couple quarts a day is the goal. A detailed review of systems today was otherwise noncontributory  PAST MEDICAL HISTORY: Past Medical History  Diagnosis Date  . Hypertension   . Hyperlipidemia   . Duodenal ulcer   . Multiple myeloma 07/04/2011  . Allergy   . Thyroid disease   . GERD (gastroesophageal reflux disease)   . Depression   . Anemia   Significant for peptic ulcer disease as noted above.  History of hyperlipidemia.  History of anxiety and depression.  History of GERD which is significantly improved with weight loss and history of reactive airway disease, possibly secondary to the GERD which also improved with weight loss.  There was a history of obesity, now much improved secondary to exercise and diet.   PAST SURGICAL HISTORY: Past Surgical History  Procedure Laterality Date  . Cardiolite study  11/25/2003    NORMAL  . Bone marrow transplant  2011    for MM  . Tonsillectomy      FAMILY HISTORY Family History  Problem Relation Age of Onset  .  Throat cancer Mother   . Hypertension Father   . Stroke Father   . Asthma Father   . Diabetes Father   . Pancreatic cancer Brother   [name] father died at the age of 9 following a stroke.  [name] mother died with cancer of the throat at the age of 63.   She had a history of depression and was a smoker.  Not clear how much alcohol she drank, according to the patient.  The patient had two brothers.  One died from the age of 11 from a "kidney problem."  The second one died at the age of 59 from pancreatic cancer. (This may have been  duodenal cancer. 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  His wife of 37+ years was [name], but they are now divorced.   The patient has two daughters:  [name] who is 99, and Butler who is 56.  They both live here in [place]. 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  The patient is very close to his daughters.  Sinn has one daughter, [name], born 03/06/2013, apparently with Weber/Osler/Rendu The patient is not a church attender.  He does derive a great deal of support from friends at the "[employer]" which he attends very regularly, and also participates in Al-Anon even though actually there is no alcohol or drug problem in the family.  He participates in this group and he gets quite a bit of support from it.      ADVANCED DIRECTIVES: In place  HEALTH MAINTENANCE: (updated 07/16/2013) History  Substance Use Topics  . Smoking status: Former Smoker -- 3 years    Quit date: 03/27/1969  . Smokeless tobacco: Never Used  . Alcohol Use: No     Colonoscopy:  PSA:  Sept 2014, normal/Dr. [name]  Lipid panel: Sept 2014/Dr. [name]   Allergies  Allergen Reactions  . Crestor [Rosuvastatin Calcium]     ADVERSE EFFECTS ON LIVER  . Lipitor [Atorvastatin Calcium]     Current Outpatient Prescriptions  Medication Sig Dispense Refill  . amLODipine (NORVASC) 2.5 MG tablet Take 1 tablet (2.5 mg total) by mouth daily.  30 tablet  5  . aspirin EC 81 MG tablet Take 81 mg by mouth every morning.      . Bisacodyl (DULCOLAX PO) Take 1 tablet by mouth.      . cetirizine (ZYRTEC) 10 MG tablet Take 10 mg by mouth daily.      . cholecalciferol (VITAMIN D) 1000 UNITS tablet take 1 tablet by mouth once daily  100 tablet  3  . diphenhydrAMINE (BENADRYL) 25 MG tablet Take 25 mg by mouth as needed  for itching.      . diphenhydramine-acetaminophen (TYLENOL PM) 25-500 MG TABS Take 1 tablet by mouth at bedtime.       [name] escitalopram (LEXAPRO) 10 MG tablet take 1 tablet by mouth once daily  30 tablet  11  . hydrocortisone (ANUSOL-HC) 25 MG suppository Place 1 suppository (25 mg total) rectally every 12 (twelve) hours.  10 suppository  1  . hydrocortisone (ANUSOL-HC) 25 MG suppository Place 1 suppository (25 mg total) rectally at bedtime as needed for hemorrhoids.  12 suppository  1  . ibuprofen (ADVIL,MOTRIN) 200 MG tablet Take 400 mg by mouth every 8 (eight)  hours as needed for pain.       [name] lenalidomide (REVLIMID) 10 MG capsule Take one capsule daily for 21 days, then off for 7 days, then repeat  21 capsule  0  . levothyroxine (SYNTHROID, LEVOTHROID) 150 MCG tablet Take 1 tablet (150 mcg total) by mouth daily.  90 tablet  3  . mupirocin ointment (BACTROBAN) 2 % Place small amount into each nostril twice daily x 5 days.  After application, press sides of nose together and gently massage.  22 g  0  . omeprazole (PRILOSEC) 40 MG capsule Take 1 capsule (40 mg total) by mouth every morning.  30 capsule  4  . polyethylene glycol (MIRALAX / GLYCOLAX) packet Take 17 g by mouth as needed.       . tadalafil (CIALIS) 20 MG tablet Take 0.5-1 tablets (10-20 mg total) by mouth daily as needed for erectile dysfunction.  10 tablet  3  . [DISCONTINUED] fexofenadine (ALLEGRA) 60 MG tablet Take 180 mg by mouth daily.         No current facility-administered medications for this visit.    OBJECTIVE: Middle-aged white man in no acute distress Filed Vitals:   12/25/13 0902  BP: 113/74  Pulse: 59  Temp: 97.7 F (36.5 C)  Resp: 18     Body mass index is 26.46 kg/(m^2).    ECOG FS: 1 Filed Weights   12/25/13 0902  Weight: 181 lb 11.2 oz (82.419 kg)   Sclerae unicteric, pupils equal and reactive Oropharynx clear and moist No cervical or supraclavicular adenopathy Lungs no rales or rhonchi Heart regular  rate and rhythm Abd soft, nontender, positive bowel sounds MSK no focal spinal tenderness, no upper extremity lymphedema Neuro: nonfocal, well oriented, appropriate affect    LAB RESULTS: Results for LES, LONGMORE (MRN [id number]) as of 12/25/2013 09:03  Ref. Range 07/29/2013 12:59 08/26/2013 10:16 10/29/2013 12:41 11/17/2013 08:22 11/28/2013 08:22  M-SPIKE, % No range found 0.33 0.34 0.48 0.44 0.43    Results for [facility] (MRN [id number]) as of 12/25/2013 09:03  Ref. Range 07/29/2013 12:59 08/26/2013 10:16 10/29/2013 12:41 11/17/2013 08:22 11/28/2013 08:22  Kappa free light chain Latest Range: 0.33-1.94 mg/dL  5.19 (H) 3.55 (H) 5.03 (H) 4.01 (H)  Lambda Free Lght Chn Latest Range: 0.57-2.63 mg/dL  5.13 (H) 4.57 (H) 4.88 (H) 3.95 (H)  Kappa:Lambda Ratio Latest Range: 0.26-1.65   1.01 0.78 1.03 1.02   Lab Results  Component Value Date   WBC 2.6* 12/25/2013   NEUTROABS 1.1* 12/25/2013   HGB 10.6* 12/25/2013   HCT 31.8* 12/25/2013   MCV 100.4* 12/25/2013   PLT 69* 12/25/2013      Chemistry      Component Value Date/Time   NA 139 11/28/2013 0822   NA 136 09/17/2013 0940   K 4.0 11/28/2013 0822   K 3.5 09/17/2013 0940   CL 103 09/17/2013 0940   CL 107 03/10/2013 1352   CO2 21* 11/28/2013 0822   CO2 26 09/17/2013 0940   BUN 16.6 11/28/2013 0822   BUN 19 09/17/2013 0940   CREATININE 1.0 11/28/2013 0822   CREATININE 1.2 09/17/2013 0940      Component Value Date/Time   CALCIUM 9.7 11/28/2013 0822   CALCIUM 8.7 09/17/2013 0940   ALKPHOS 82 11/28/2013 0822   ALKPHOS 62 06/16/2013 1547   AST 16 11/28/2013 0822   AST 26 06/16/2013 1547   ALT 18 11/28/2013 0822   ALT 23 06/16/2013 1547   BILITOT 0.29 11/28/2013  0037   BILITOT 0.5 06/16/2013 1547      STUDIES: No results found. ASSESSMENT: 63 y.o.  [place] man with a history of multiple myeloma diagnosed February of 2012, with an initial M-spike of 2.6 g/dL, IFE showing an IgG lambda paraprotein and free lambda light chains in the urine.  Cytogenetics showed trisomy 72. Bone marrow biopsy showed a 22% plasmacytosis. Treated with   (1) bortezomib (subcutaneously), lenalidomide, and dexamethasone, with repeat bone marrow biopsy May of 2012 showing 10% plasmacytosis   (2) high-dose chemotherapy with BCNU and melphalan at [facility], followed by stem cell rescue July of 2012   (3) on zoledronic acid started December of 2012, initially monthly, currently given every 3 months, most recent dose 11/28/2013  (4) low-dose lenalidomide resumed April 2013, interrupted several times since.  Resumed again on 02/19/2013, and a dose of 5 mg daily, 21 days on and 7 days off.  (5) CNS symptoms and abnormal brain MRI extensively evaluated by neurology with no definitive diagnosis established; improving   (6) rising M spike noted June 2014 but did not meet criteria for carfilzomib study  (7) on lenalidomide 25 mg daily, 14 days on, 7 days off, starting 04/18/2013, interrupted December 2014 because of rash; resumed January 2015 at 10 mg/ day 21 days on/ 7 days off   PLAN:  Andre [name] is very stable as far as his multiple myeloma is concerned. He has slightly progressive cytopenias and mild to moderate symptoms from the lenalidomide but I am making no changes in his dose or treatment interval. We repeated his lab work today, with results pending, and we will continue to check his lab work on a monthly basis. If he sees Dr. Alvie Heidelberg in June and then he can see me again in August.  We again went over her bowel prophylaxis issues and I again urged him to take MiraLAX in addition to stool softeners. [name] agrees he can be a little "forgetful" and that is a concern, however I do not see progression in that problem over the past year.  He has a good understanding of the overall plan. He agrees with it. He will call with any problems that may develop before the next visit here.   Chauncey Cruel, MD     12/25/2013

## 2013-12-25 NOTE — Telephone Encounter (Signed)
, °

## 2013-12-25 NOTE — Telephone Encounter (Signed)
Per staff message and POF I have scheduled appts.  JMW  

## 2013-12-29 LAB — PROTEIN ELECTROPHORESIS, SERUM
ALBUMIN ELP: 57.9 % (ref 55.8–66.1)
ALPHA-1-GLOBULIN: 4.3 % (ref 2.9–4.9)
Alpha-2-Globulin: 10.4 % (ref 7.1–11.8)
Beta 2: 4.6 % (ref 3.2–6.5)
Beta Globulin: 7 % (ref 4.7–7.2)
GAMMA GLOBULIN: 15.8 % (ref 11.1–18.8)
M-Spike, %: 0.36 g/dL
TOTAL PROTEIN, SERUM ELECTROPHOR: 6.2 g/dL (ref 6.0–8.3)

## 2013-12-29 LAB — KAPPA/LAMBDA LIGHT CHAINS
Kappa free light chain: 4.28 mg/dL — ABNORMAL HIGH (ref 0.33–1.94)
Kappa:Lambda Ratio: 0.83 (ref 0.26–1.65)
LAMBDA FREE LGHT CHN: 5.15 mg/dL — AB (ref 0.57–2.63)

## 2014-01-06 ENCOUNTER — Other Ambulatory Visit: Payer: Self-pay | Admitting: *Deleted

## 2014-01-06 DIAGNOSIS — C9000 Multiple myeloma not having achieved remission: Secondary | ICD-10-CM

## 2014-01-06 MED ORDER — LENALIDOMIDE 10 MG PO CAPS: ORAL_CAPSULE | ORAL | Status: DC

## 2014-01-06 NOTE — Telephone Encounter (Signed)
THIS REFILL REQUEST FOR REVLIMID WAS GIVEN TO DR.MAGRINAT'S NURSE, AMY MITCHELL,RN. 

## 2014-01-22 ENCOUNTER — Other Ambulatory Visit (HOSPITAL_BASED_OUTPATIENT_CLINIC_OR_DEPARTMENT_OTHER): Payer: Commercial Managed Care - PPO

## 2014-01-22 ENCOUNTER — Telehealth: Payer: Self-pay | Admitting: Oncology

## 2014-01-22 DIAGNOSIS — D649 Anemia, unspecified: Secondary | ICD-10-CM

## 2014-01-22 DIAGNOSIS — J45909 Unspecified asthma, uncomplicated: Secondary | ICD-10-CM

## 2014-01-22 DIAGNOSIS — C9000 Multiple myeloma not having achieved remission: Secondary | ICD-10-CM

## 2014-01-22 DIAGNOSIS — E039 Hypothyroidism, unspecified: Secondary | ICD-10-CM

## 2014-01-22 DIAGNOSIS — K279 Peptic ulcer, site unspecified, unspecified as acute or chronic, without hemorrhage or perforation: Secondary | ICD-10-CM

## 2014-01-22 LAB — COMPREHENSIVE METABOLIC PANEL (CC13)
ALT: 13 U/L (ref 0–55)
AST: 15 U/L (ref 5–34)
Albumin: 3.3 g/dL — ABNORMAL LOW (ref 3.5–5.0)
Alkaline Phosphatase: 70 U/L (ref 40–150)
Anion Gap: 11 mEq/L (ref 3–11)
BILIRUBIN TOTAL: 0.31 mg/dL (ref 0.20–1.20)
BUN: 21.4 mg/dL (ref 7.0–26.0)
CO2: 21 mEq/L — ABNORMAL LOW (ref 22–29)
CREATININE: 1.1 mg/dL (ref 0.7–1.3)
Calcium: 9.5 mg/dL (ref 8.4–10.4)
Chloride: 109 mEq/L (ref 98–109)
Glucose: 82 mg/dl (ref 70–140)
Potassium: 4.3 mEq/L (ref 3.5–5.1)
Sodium: 141 mEq/L (ref 136–145)
Total Protein: 6.6 g/dL (ref 6.4–8.3)

## 2014-01-22 LAB — CBC WITH DIFFERENTIAL/PLATELET
BASO%: 1.5 % (ref 0.0–2.0)
Basophils Absolute: 0 10*3/uL (ref 0.0–0.1)
EOS%: 5.7 % (ref 0.0–7.0)
Eosinophils Absolute: 0.2 10*3/uL (ref 0.0–0.5)
HEMATOCRIT: 32.3 % — AB (ref 38.4–49.9)
HGB: 10.9 g/dL — ABNORMAL LOW (ref 13.0–17.1)
LYMPH%: 33.1 % (ref 14.0–49.0)
MCH: 34.1 pg — ABNORMAL HIGH (ref 27.2–33.4)
MCHC: 33.8 g/dL (ref 32.0–36.0)
MCV: 100.9 fL — ABNORMAL HIGH (ref 79.3–98.0)
MONO#: 0.5 10*3/uL (ref 0.1–0.9)
MONO%: 19.5 % — ABNORMAL HIGH (ref 0.0–14.0)
NEUT#: 1.1 10*3/uL — ABNORMAL LOW (ref 1.5–6.5)
NEUT%: 40.2 % (ref 39.0–75.0)
PLATELETS: 70 10*3/uL — AB (ref 140–400)
RBC: 3.2 10*6/uL — AB (ref 4.20–5.82)
RDW: 16.4 % — ABNORMAL HIGH (ref 11.0–14.6)
WBC: 2.8 10*3/uL — ABNORMAL LOW (ref 4.0–10.3)
lymph#: 0.9 10*3/uL (ref 0.9–3.3)

## 2014-01-22 LAB — PROTEIN / CREATININE RATIO, URINE
Creatinine, Urine: 151.8 mg/dL
PROTEIN CREATININE RATIO: 0.04 (ref <0.15)
TOTAL PROTEIN, URINE: 6 mg/dL

## 2014-01-23 ENCOUNTER — Telehealth: Payer: Self-pay | Admitting: *Deleted

## 2014-01-23 NOTE — Telephone Encounter (Signed)
Per staff message from scheduler I have moved appt from 6/4 to 6/2

## 2014-01-26 ENCOUNTER — Other Ambulatory Visit: Payer: Self-pay | Admitting: Oncology

## 2014-01-26 ENCOUNTER — Ambulatory Visit (HOSPITAL_BASED_OUTPATIENT_CLINIC_OR_DEPARTMENT_OTHER): Payer: Commercial Managed Care - PPO | Admitting: Oncology

## 2014-01-26 DIAGNOSIS — C9001 Multiple myeloma in remission: Secondary | ICD-10-CM

## 2014-01-26 DIAGNOSIS — F411 Generalized anxiety disorder: Secondary | ICD-10-CM

## 2014-01-26 DIAGNOSIS — C9000 Multiple myeloma not having achieved remission: Secondary | ICD-10-CM

## 2014-01-26 DIAGNOSIS — F29 Unspecified psychosis not due to a substance or known physiological condition: Secondary | ICD-10-CM

## 2014-01-26 LAB — KAPPA/LAMBDA LIGHT CHAINS
KAPPA FREE LGHT CHN: 1.87 mg/dL (ref 0.33–1.94)
Kappa:Lambda Ratio: 0.31 (ref 0.26–1.65)
Lambda Free Lght Chn: 6.11 mg/dL — ABNORMAL HIGH (ref 0.57–2.63)

## 2014-01-26 LAB — PROTEIN ELECTROPHORESIS, SERUM
Albumin ELP: 57.9 % (ref 55.8–66.1)
Alpha-1-Globulin: 4 % (ref 2.9–4.9)
Alpha-2-Globulin: 10.1 % (ref 7.1–11.8)
Beta 2: 4.3 % (ref 3.2–6.5)
Beta Globulin: 7.4 % — ABNORMAL HIGH (ref 4.7–7.2)
GAMMA GLOBULIN: 16.3 % (ref 11.1–18.8)
M-SPIKE, %: 0.45 g/dL
Total Protein, Serum Electrophoresis: 6.4 g/dL (ref 6.0–8.3)

## 2014-01-26 NOTE — Progress Notes (Signed)
ID: PLEZ BELTON   DOB: 06/04/1951  MR#: [id number]  JSE#:[id number]   PCP: Cristie Hem [name] SU: OTHER MD: [name], [name], Shanon Rosser  CHIEF COMPLAINT: Multiple myeloma   HISTORY OF PRESENT ILLNESS: The patient was worked up for peptic ulcer disease in August of 2011, with significant bleeding and anemia.  The patient was Helicobacter pylori negative.  He received some epinephrine when he had his EGD and then started on Protonix.  [name] anemia slowly resolved so that by September, his hemoglobin was up to 10.9 and by earlier this month, his hemoglobin was up to 12.5.    As part of his general workup, he was found to have a slightly elevated globulin fraction.  In September, his total protein was 8.3 with an albumin of 3.8.  In January, the total protein was 8.4 and albumin 3.6.  With persistence of this slight abnormality, Dr. Olevia Perches obtained serum immunofixation and SPEP.  The SPTP showed an M-spike of 2.67 grams.  A total IgG was 4,190.  Total IgA low at 28.  Total IgM low at 28 also.  The immunofixation showed a monoclonal IgG lambda paraprotein.  There were also monoclonal free lambda light chains present. With this information, the patient was referred for further evaluation.   A diagnosis of myeloma was confirmed by bone marrow biopsy and subsequebnt treatment is as detailed below  INTERVAL HISTORY: [name] returns today for an unscheduled visit and accompanied by his "girlfriend" Camey. He walked in today because he was feeling forgetful and confused. He had a bit of a headache. It was mostly on the left face. It was not severe. He says he was trying to do something at work and couldn't get it done. He did feel a great deal of anxiety because he had to tell his wife that he would no longer pay for her to be a member of a country club. That apparently was not in their agreement. He was a very stressful situation for him   REVIEW OF SYSTEMS: [name]  reports no changes in vision, no nausea or vomiting, no dizziness or gait imbalance. Has been no fever, rash or bleeding. He denies any cough, shortness of breath, or change in bowel or bladder habits. He denies pain. A detailed review of systems was otherwise noncontributory  PAST MEDICAL HISTORY: Past Medical History  Diagnosis Date  . Hypertension   . Hyperlipidemia   . Duodenal ulcer   . Multiple myeloma 07/04/2011  . Allergy   . Thyroid disease   . GERD (gastroesophageal reflux disease)   . Depression   . Anemia   Significant for peptic ulcer disease as noted above.  History of hyperlipidemia.  History of anxiety and depression.  History of GERD which is significantly improved with weight loss and history of reactive airway disease, possibly secondary to the GERD which also improved with weight loss.  There was a history of obesity, now much improved secondary to exercise and diet.   PAST SURGICAL HISTORY: Past Surgical History  Procedure Laterality Date  . Cardiolite study  11/25/2003    NORMAL  . Bone marrow transplant  2011    for MM  . Tonsillectomy      FAMILY HISTORY Family History  Problem Relation Age of Onset  . Throat cancer Mother   . Hypertension Father   . Stroke Father   . Asthma Father   . Diabetes Father   . Pancreatic cancer Brother   [name] father  died at the age of 31 following a stroke.  [name] mother died with cancer of the throat at the age of 25.   She had a history of depression and was a smoker.  Not clear how much alcohol she drank, according to the patient.  The patient had two brothers.  One died from the age of 59 from a "kidney problem."  The second one died at the age of 3 from pancreatic cancer. (This may have been duodenal cancer. 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  His wife of 37+ years was [name], but they are now divorced.   The patient has two daughters:  [name] who is 88, and Turney who is 18.  They both live here in [place]. 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  The patient is very close to his daughters.  [place] has one daughter, [name], born 03/06/2013, apparently with Weber/Osler/Rendu The patient is not a church attender.  He does derive a great deal of support from friends at the "[employer]" which he attends very regularly, and also participates in Al-Anon even though actually there is no alcohol or drug problem in the family.  He participates in this group and he gets quite a bit of support from it.      ADVANCED DIRECTIVES: In place  HEALTH MAINTENANCE: (updated 07/16/2013) History  Substance Use Topics  . Smoking status: Former Smoker -- 3 years    Quit date: 03/27/1969  . Smokeless tobacco: Never Used  . Alcohol Use: No     Colonoscopy:  PSA:  Sept 2014, normal/Dr. [name]  Lipid panel: Sept 2014/Dr. [name]   Allergies  Allergen Reactions  . Crestor [Rosuvastatin Calcium]     ADVERSE EFFECTS ON LIVER  . Lipitor [Atorvastatin Calcium]     Current Outpatient Prescriptions  Medication Sig Dispense Refill  . amLODipine (NORVASC) 2.5 MG tablet Take 1 tablet (2.5 mg total) by mouth daily.  30 tablet  5  . aspirin EC 81 MG tablet Take 81 mg by mouth every morning.      . Bisacodyl (DULCOLAX PO) Take 1 tablet by mouth.      . cetirizine (ZYRTEC) 10 MG tablet Take 10 mg by mouth daily.      . cholecalciferol (VITAMIN D) 1000 UNITS tablet take 1 tablet by mouth once daily  100 tablet  3  . diphenhydrAMINE (BENADRYL) 25 MG tablet Take 25 mg by mouth as needed for itching.      . diphenhydramine-acetaminophen (TYLENOL PM) 25-500 MG TABS Take 1 tablet by mouth at bedtime.       [name] escitalopram (LEXAPRO) 10 MG tablet take 1 tablet by mouth once daily  30 tablet  11  .  hydrocortisone (ANUSOL-HC) 25 MG suppository Place 1 suppository (25 mg total) rectally every 12 (twelve) hours.  10 suppository  1  . hydrocortisone (ANUSOL-HC) 25 MG suppository Place 1 suppository (25 mg total) rectally at bedtime as needed for hemorrhoids.  12 suppository  1  . ibuprofen (ADVIL,MOTRIN) 200 MG tablet Take 400 mg by mouth every 8 (eight) hours as needed for pain.       [name] lenalidomide (REVLIMID) 10 MG capsule Take one capsule daily for 21 days, then off for 7 days, then repeat  21 capsule  0  . levothyroxine (SYNTHROID, LEVOTHROID) 150 MCG tablet Take 1 tablet (150 mcg total) by mouth daily.  90 tablet  3  . mupirocin ointment (BACTROBAN) 2 % Place small amount into each nostril twice daily x 5 days.  After application, press sides of nose together and gently massage.  22 g  0  . omeprazole (PRILOSEC) 40 MG capsule Take 1 capsule (40 mg total) by mouth every morning.  30 capsule  4  . polyethylene glycol (MIRALAX / GLYCOLAX) packet Take 17 g by mouth as needed.       . tadalafil (CIALIS) 20 MG tablet Take 0.5-1 tablets (10-20 mg total) by mouth daily as needed for erectile dysfunction.  10 tablet  3  . [DISCONTINUED] fexofenadine (ALLEGRA) 60 MG tablet Take 180 mg by mouth daily.         No current facility-administered medications for this visit.    OBJECTIVE: Middle-aged white man who appears stated age  Blood pressure sitting was 130/72 with heart rate 56, standing was 119/77 with heart rate 72 Sclerae unicteric, pupils equal and reactive, EOMs intact Oropharynx clear and moist No cervical or supraclavicular adenopathy Lungs no rales or rhonchi Heart regular rate and rhythm, no murmur appreciated Abd soft, nontender, positive bowel sounds MSK no focal spinal tenderness, no joint edema Neuro: nonfocal, and specifically 5 over 5 motor throughout, with no sensory level. Normal grimace and frown. Affect is "spacey". He was unable to interpret figurative comments ("did you  where a kevlar Vest?") and did not remember the name of the name of his neurologist   LAB RESULTS:  Lab Results  Component Value Date   WBC 2.8* 01/22/2014   NEUTROABS 1.1* 01/22/2014   HGB 10.9* 01/22/2014   HCT 32.3* 01/22/2014   MCV 100.9* 01/22/2014   PLT 70* 01/22/2014      Chemistry      Component Value Date/Time   NA 141 01/22/2014 0813   NA 136 09/17/2013 0940   K 4.3 01/22/2014 0813   K 3.5 09/17/2013 0940   CL 103 09/17/2013 0940   CL 107 03/10/2013 1352   CO2 21* 01/22/2014 0813   CO2 26 09/17/2013 0940   BUN 21.4 01/22/2014 0813   BUN 19 09/17/2013 0940   CREATININE 1.1 01/22/2014 0813   CREATININE 1.2 09/17/2013 0940      Component Value Date/Time   CALCIUM 9.5 01/22/2014 0813   CALCIUM 8.7 09/17/2013 0940   ALKPHOS 70 01/22/2014 0813   ALKPHOS 62 06/16/2013 1547   AST 15 01/22/2014 0813   AST 26 06/16/2013 1547   ALT 13 01/22/2014 0813   ALT 23 06/16/2013 1547   BILITOT 0.31 01/22/2014 0813   BILITOT 0.5 06/16/2013 1547      STUDIES: No results found.  ASSESSMENT: 63 y.o.  [place] man with a history of multiple myeloma diagnosed February of 2012, with an initial M-spike of 2.6 g/dL, IFE showing an IgG lambda paraprotein and free lambda light chains in the urine. Cytogenetics showed trisomy 57. Bone marrow biopsy showed a 22% plasmacytosis. Treated with   (1) bortezomib (subcutaneously), lenalidomide, and dexamethasone, with repeat bone marrow biopsy May of 2012 showing 10% plasmacytosis   (2) high-dose chemotherapy with BCNU and melphalan at [facility], followed by stem cell rescue July of 2012   (3) on zoledronic acid started December of 2012, initially monthly, currently given every 3 months, most recent dose 11/28/2013  (4) low-dose lenalidomide resumed April 2013, interrupted several times since.  Resumed again  on 02/19/2013, and a dose of 5 mg daily, 21 days on and 7 days off.  (5) CNS symptoms and abnormal brain MRI extensively evaluated by neurology with no definitive diagnosis  established; improving   (6) rising M spike noted June 2014 but did not meet criteria for carfilzomib study  (7) on lenalidomide 25 mg daily, 14 days on, 7 days off, starting 04/18/2013, interrupted December 2014 because of rash; resumed January 2015 at 10 mg/ day 21 days on/ 7 days off   PLAN:  I am not sure what is going on with [name]. His neurologic exam is nonfocal. Or this could be anxiety I suppose from the stressful situation with his wife today. Nevertheless I am setting him up for an MRI of the brain on the earliest we were able to schedule is for 2 days from now. I have asked him to call Dr. Jannifer Franklin and alert them as to his symptoms and certainly call him or [place] or go to the emergency room if any new symptoms develop. Otherwise we will call him as soon as we have the results of his brain MRI.  In the meantime we are holding the Revlimid N. Natalia Leatherwood. I have asked him to rehydrate himself over the next 2 hours since he is mildly orthostatic.  He knows to call for any problems that may develop before the next visit here.  Chauncey Cruel, MD     01/26/2014

## 2014-01-28 ENCOUNTER — Ambulatory Visit (HOSPITAL_COMMUNITY)
Admission: RE | Admit: 2014-01-28 | Discharge: 2014-01-28 | Disposition: A | Discharge status: Home / Self Care | Payer: Commercial Managed Care - PPO | Source: Ambulatory Visit | Attending: Oncology | Admitting: Oncology

## 2014-01-28 ENCOUNTER — Telehealth: Payer: Self-pay | Admitting: *Deleted

## 2014-01-28 DIAGNOSIS — C9001 Multiple myeloma in remission: Secondary | ICD-10-CM | POA: Insufficient documentation

## 2014-01-28 DIAGNOSIS — R4182 Altered mental status, unspecified: Secondary | ICD-10-CM | POA: Insufficient documentation

## 2014-01-28 MED ORDER — GADOBENATE DIMEGLUMINE 529 MG/ML IV SOLN
17.0000 mL | Freq: Once | INTRAVENOUS | Status: AC | PRN
  Administered 2014-01-28: 17 mL via INTRAVENOUS

## 2014-01-28 NOTE — Telephone Encounter (Signed)
Called patient following MRI from today, per Dr [name], MRI is negative. Patient to continue with Revlimid as before. Patient verbalized understanding. Forwarded information to Dr Nigel Bridgeman, Neurologist at [facility] per patient request for follow up in August 2015.

## 2014-01-29 ENCOUNTER — Telehealth: Payer: Self-pay | Admitting: *Deleted

## 2014-01-29 NOTE — Telephone Encounter (Signed)
This RN received call from pt stating he will be seeing Dr Doneen Poisson at [facility] tomorrow 5/15 - per discussion - [name] will pick up a disc of MRI obtained 5/11 to take with hime for MD review.  This RN called to radiology and spoke with [facility] per above. Disc will be available for pick up today. Pt aware.

## 2014-01-30 ENCOUNTER — Other Ambulatory Visit: Payer: Self-pay | Admitting: *Deleted

## 2014-01-30 DIAGNOSIS — C9000 Multiple myeloma not having achieved remission: Secondary | ICD-10-CM

## 2014-01-30 NOTE — Telephone Encounter (Signed)
THIS REFILL REQUEST FOR REVLIMID WAS GIVEN TO DR.MAGRINAT'S NURSE, AMY MITCHELL,RN. 

## 2014-02-03 MED ORDER — LENALIDOMIDE 10 MG PO CAPS: ORAL_CAPSULE | ORAL | Status: DC

## 2014-02-03 NOTE — Addendum Note (Signed)
Addended by: Lannette Donath E on: 02/03/2014 12:32 PM   Modules accepted: Orders

## 2014-02-17 ENCOUNTER — Other Ambulatory Visit: Payer: Self-pay | Admitting: *Deleted

## 2014-02-17 ENCOUNTER — Other Ambulatory Visit (HOSPITAL_BASED_OUTPATIENT_CLINIC_OR_DEPARTMENT_OTHER): Payer: Commercial Managed Care - PPO

## 2014-02-17 ENCOUNTER — Ambulatory Visit (HOSPITAL_BASED_OUTPATIENT_CLINIC_OR_DEPARTMENT_OTHER): Payer: Commercial Managed Care - PPO

## 2014-02-17 VITALS — BP 105/69 | HR 46 | Temp 97.8°F | Resp 20

## 2014-02-17 DIAGNOSIS — D649 Anemia, unspecified: Secondary | ICD-10-CM

## 2014-02-17 DIAGNOSIS — K279 Peptic ulcer, site unspecified, unspecified as acute or chronic, without hemorrhage or perforation: Secondary | ICD-10-CM

## 2014-02-17 DIAGNOSIS — C9000 Multiple myeloma not having achieved remission: Secondary | ICD-10-CM

## 2014-02-17 DIAGNOSIS — E039 Hypothyroidism, unspecified: Secondary | ICD-10-CM

## 2014-02-17 DIAGNOSIS — J45909 Unspecified asthma, uncomplicated: Secondary | ICD-10-CM

## 2014-02-17 LAB — CBC WITH DIFFERENTIAL/PLATELET
BASO%: 0.6 % (ref 0.0–2.0)
Basophils Absolute: 0 10*3/uL (ref 0.0–0.1)
EOS%: 6.7 % (ref 0.0–7.0)
Eosinophils Absolute: 0.2 10*3/uL (ref 0.0–0.5)
HCT: 32.8 % — ABNORMAL LOW (ref 38.4–49.9)
HGB: 10.9 g/dL — ABNORMAL LOW (ref 13.0–17.1)
LYMPH%: 35.4 % (ref 14.0–49.0)
MCH: 33.6 pg — AB (ref 27.2–33.4)
MCHC: 33.3 g/dL (ref 32.0–36.0)
MCV: 100.9 fL — AB (ref 79.3–98.0)
MONO#: 0.4 10*3/uL (ref 0.1–0.9)
MONO%: 15.6 % — AB (ref 0.0–14.0)
NEUT#: 1.2 10*3/uL — ABNORMAL LOW (ref 1.5–6.5)
NEUT%: 41.7 % (ref 39.0–75.0)
Platelets: 83 10*3/uL — ABNORMAL LOW (ref 140–400)
RBC: 3.25 10*6/uL — ABNORMAL LOW (ref 4.20–5.82)
RDW: 16.8 % — AB (ref 11.0–14.6)
WBC: 2.9 10*3/uL — ABNORMAL LOW (ref 4.0–10.3)
lymph#: 1 10*3/uL (ref 0.9–3.3)

## 2014-02-17 LAB — COMPREHENSIVE METABOLIC PANEL (CC13)
ALK PHOS: 75 U/L (ref 40–150)
ALT: 17 U/L (ref 0–55)
AST: 17 U/L (ref 5–34)
Albumin: 3.3 g/dL — ABNORMAL LOW (ref 3.5–5.0)
Anion Gap: 11 mEq/L (ref 3–11)
BUN: 17.2 mg/dL (ref 7.0–26.0)
CALCIUM: 10 mg/dL (ref 8.4–10.4)
CHLORIDE: 107 meq/L (ref 98–109)
CO2: 22 mEq/L (ref 22–29)
CREATININE: 1.2 mg/dL (ref 0.7–1.3)
Glucose: 96 mg/dl (ref 70–140)
Potassium: 4.7 mEq/L (ref 3.5–5.1)
Sodium: 141 mEq/L (ref 136–145)
Total Bilirubin: 0.44 mg/dL (ref 0.20–1.20)
Total Protein: 6.7 g/dL (ref 6.4–8.3)

## 2014-02-17 LAB — PROTEIN / CREATININE RATIO, URINE
CREATININE, URINE: 96.4 mg/dL
Protein Creatinine Ratio: 0.05 (ref <0.15)
Total Protein, Urine: 5 mg/dL

## 2014-02-17 MED ORDER — SODIUM CHLORIDE 0.9 % IV SOLN
INTRAVENOUS | Status: DC
  Administered 2014-02-17: 09:00:00 via INTRAVENOUS

## 2014-02-17 MED ORDER — OMEPRAZOLE 40 MG PO CPDR: 40.0000 mg | DELAYED_RELEASE_CAPSULE | Freq: Every morning | ORAL | Status: DC

## 2014-02-17 MED ORDER — ZOLEDRONIC ACID 4 MG/100ML IV SOLN
4.0000 mg | Freq: Once | INTRAVENOUS | Status: AC
  Administered 2014-02-17: 4 mg via INTRAVENOUS
  Filled 2014-02-17: qty 100

## 2014-02-17 NOTE — Patient Instructions (Signed)

## 2014-02-19 ENCOUNTER — Ambulatory Visit: Payer: Commercial Managed Care - PPO

## 2014-02-19 ENCOUNTER — Other Ambulatory Visit: Payer: Commercial Managed Care - PPO

## 2014-02-19 LAB — PROTEIN ELECTROPHORESIS, SERUM
Albumin ELP: 57.5 % (ref 55.8–66.1)
Alpha-1-Globulin: 4.1 % (ref 2.9–4.9)
Alpha-2-Globulin: 9.6 % (ref 7.1–11.8)
Beta 2: 4.2 % (ref 3.2–6.5)
Beta Globulin: 7.4 % — ABNORMAL HIGH (ref 4.7–7.2)
Gamma Globulin: 17.2 % (ref 11.1–18.8)
M-Spike, %: 0.53 g/dL
TOTAL PROTEIN, SERUM ELECTROPHOR: 6.6 g/dL (ref 6.0–8.3)

## 2014-02-19 LAB — KAPPA/LAMBDA LIGHT CHAINS
Kappa free light chain: 2.89 mg/dL — ABNORMAL HIGH (ref 0.33–1.94)
Kappa:Lambda Ratio: 0.59 (ref 0.26–1.65)
LAMBDA FREE LGHT CHN: 4.89 mg/dL — AB (ref 0.57–2.63)

## 2014-02-24 ENCOUNTER — Other Ambulatory Visit: Payer: Self-pay | Admitting: *Deleted

## 2014-03-10 ENCOUNTER — Other Ambulatory Visit: Payer: Self-pay | Admitting: Physician Assistant

## 2014-03-18 ENCOUNTER — Other Ambulatory Visit (HOSPITAL_BASED_OUTPATIENT_CLINIC_OR_DEPARTMENT_OTHER): Payer: Commercial Managed Care - PPO

## 2014-03-18 DIAGNOSIS — J45909 Unspecified asthma, uncomplicated: Secondary | ICD-10-CM

## 2014-03-18 DIAGNOSIS — C9000 Multiple myeloma not having achieved remission: Secondary | ICD-10-CM

## 2014-03-18 DIAGNOSIS — K279 Peptic ulcer, site unspecified, unspecified as acute or chronic, without hemorrhage or perforation: Secondary | ICD-10-CM

## 2014-03-18 DIAGNOSIS — E039 Hypothyroidism, unspecified: Secondary | ICD-10-CM

## 2014-03-18 DIAGNOSIS — D649 Anemia, unspecified: Secondary | ICD-10-CM

## 2014-03-18 LAB — COMPREHENSIVE METABOLIC PANEL (CC13)
ALBUMIN: 3.3 g/dL — AB (ref 3.5–5.0)
ALT: 16 U/L (ref 0–55)
ANION GAP: 9 meq/L (ref 3–11)
AST: 17 U/L (ref 5–34)
Alkaline Phosphatase: 65 U/L (ref 40–150)
BUN: 22.1 mg/dL (ref 7.0–26.0)
CHLORIDE: 107 meq/L (ref 98–109)
CO2: 22 meq/L (ref 22–29)
Calcium: 9.4 mg/dL (ref 8.4–10.4)
Creatinine: 1.2 mg/dL (ref 0.7–1.3)
GLUCOSE: 100 mg/dL (ref 70–140)
POTASSIUM: 4.1 meq/L (ref 3.5–5.1)
SODIUM: 138 meq/L (ref 136–145)
TOTAL PROTEIN: 6.7 g/dL (ref 6.4–8.3)
Total Bilirubin: 0.33 mg/dL (ref 0.20–1.20)

## 2014-03-18 LAB — CBC WITH DIFFERENTIAL/PLATELET
BASO%: 2 % (ref 0.0–2.0)
BASOS ABS: 0.1 10*3/uL (ref 0.0–0.1)
EOS ABS: 0.2 10*3/uL (ref 0.0–0.5)
EOS%: 8.2 % — AB (ref 0.0–7.0)
HCT: 31.1 % — ABNORMAL LOW (ref 38.4–49.9)
HGB: 10.6 g/dL — ABNORMAL LOW (ref 13.0–17.1)
LYMPH%: 39.6 % (ref 14.0–49.0)
MCH: 33 pg (ref 27.2–33.4)
MCHC: 34.1 g/dL (ref 32.0–36.0)
MCV: 96.9 fL (ref 79.3–98.0)
MONO#: 0.4 10*3/uL (ref 0.1–0.9)
MONO%: 16.5 % — ABNORMAL HIGH (ref 0.0–14.0)
NEUT%: 33.7 % — ABNORMAL LOW (ref 39.0–75.0)
NEUTROS ABS: 0.9 10*3/uL — AB (ref 1.5–6.5)
PLATELETS: 70 10*3/uL — AB (ref 140–400)
RBC: 3.21 10*6/uL — ABNORMAL LOW (ref 4.20–5.82)
RDW: 15.3 % — AB (ref 11.0–14.6)
WBC: 2.6 10*3/uL — ABNORMAL LOW (ref 4.0–10.3)
lymph#: 1 10*3/uL (ref 0.9–3.3)

## 2014-03-18 LAB — PROTEIN / CREATININE RATIO, URINE
Creatinine, Urine: 74.7 mg/dL
PROTEIN CREATININE RATIO: 0.04 (ref <0.15)
TOTAL PROTEIN, URINE: 3 mg/dL

## 2014-03-19 ENCOUNTER — Other Ambulatory Visit: Payer: Self-pay | Admitting: *Deleted

## 2014-03-19 ENCOUNTER — Other Ambulatory Visit: Payer: Commercial Managed Care - PPO

## 2014-03-19 NOTE — Telephone Encounter (Signed)
THIS REFILL REQUEST FOR REVLIMID WAS GIVEN TO DR.MAGRINAT'S NURSE, VAL DODD,RN.

## 2014-03-23 ENCOUNTER — Other Ambulatory Visit: Payer: Self-pay | Admitting: *Deleted

## 2014-03-23 LAB — PROTEIN ELECTROPHORESIS, SERUM
ALBUMIN ELP: 55 % — AB (ref 55.8–66.1)
ALPHA-1-GLOBULIN: 4.2 % (ref 2.9–4.9)
ALPHA-2-GLOBULIN: 10.5 % (ref 7.1–11.8)
Beta 2: 5.3 % (ref 3.2–6.5)
Beta Globulin: 6.7 % (ref 4.7–7.2)
COMMENT (PROTEIN ELECTROPHOR): 22
Gamma Globulin: 18.3 % (ref 11.1–18.8)
M-Spike, %: 0.55 g/dL
TOTAL PROTEIN, SERUM ELECTROPHOR: 6.5 g/dL (ref 6.0–8.3)

## 2014-03-23 LAB — KAPPA/LAMBDA LIGHT CHAINS
Kappa free light chain: 5.13 mg/dL — ABNORMAL HIGH (ref 0.33–1.94)
Kappa:Lambda Ratio: 0.75 (ref 0.26–1.65)
Lambda Free Lght Chn: 6.86 mg/dL — ABNORMAL HIGH (ref 0.57–2.63)

## 2014-03-25 ENCOUNTER — Other Ambulatory Visit: Payer: Self-pay | Admitting: *Deleted

## 2014-03-25 DIAGNOSIS — C9000 Multiple myeloma not having achieved remission: Secondary | ICD-10-CM

## 2014-03-25 MED ORDER — LENALIDOMIDE 10 MG PO CAPS: ORAL_CAPSULE | ORAL | Status: DC

## 2014-03-27 ENCOUNTER — Encounter: Payer: Self-pay | Admitting: Oncology

## 2014-03-30 ENCOUNTER — Encounter: Payer: Self-pay | Admitting: Oncology

## 2014-03-30 NOTE — Telephone Encounter (Addendum)
Message printed and taken to Dr. [name] for review. 03-31-2014 Dr. [name] collaborative spoke with patient.  Patient instructed his ratios are good.  No need to resume revlimid.

## 2014-04-16 ENCOUNTER — Other Ambulatory Visit (HOSPITAL_BASED_OUTPATIENT_CLINIC_OR_DEPARTMENT_OTHER): Payer: Commercial Managed Care - PPO

## 2014-04-16 DIAGNOSIS — K279 Peptic ulcer, site unspecified, unspecified as acute or chronic, without hemorrhage or perforation: Secondary | ICD-10-CM

## 2014-04-16 DIAGNOSIS — C9000 Multiple myeloma not having achieved remission: Secondary | ICD-10-CM

## 2014-04-16 DIAGNOSIS — E039 Hypothyroidism, unspecified: Secondary | ICD-10-CM

## 2014-04-16 DIAGNOSIS — D649 Anemia, unspecified: Secondary | ICD-10-CM

## 2014-04-16 DIAGNOSIS — J45909 Unspecified asthma, uncomplicated: Secondary | ICD-10-CM

## 2014-04-16 LAB — CBC WITH DIFFERENTIAL/PLATELET
BASO%: 1.8 % (ref 0.0–2.0)
BASOS ABS: 0 10*3/uL (ref 0.0–0.1)
EOS%: 6.3 % (ref 0.0–7.0)
Eosinophils Absolute: 0.2 10*3/uL (ref 0.0–0.5)
HCT: 33.3 % — ABNORMAL LOW (ref 38.4–49.9)
HEMOGLOBIN: 11.1 g/dL — AB (ref 13.0–17.1)
LYMPH%: 40.4 % (ref 14.0–49.0)
MCH: 33 pg (ref 27.2–33.4)
MCHC: 33.3 g/dL (ref 32.0–36.0)
MCV: 99.1 fL — ABNORMAL HIGH (ref 79.3–98.0)
MONO#: 0.5 10*3/uL (ref 0.1–0.9)
MONO%: 19.3 % — AB (ref 0.0–14.0)
NEUT#: 0.8 10*3/uL — ABNORMAL LOW (ref 1.5–6.5)
NEUT%: 32.2 % — ABNORMAL LOW (ref 39.0–75.0)
Platelets: 84 10*3/uL — ABNORMAL LOW (ref 140–400)
RBC: 3.36 10*6/uL — AB (ref 4.20–5.82)
RDW: 16.3 % — AB (ref 11.0–14.6)
WBC: 2.5 10*3/uL — ABNORMAL LOW (ref 4.0–10.3)
lymph#: 1 10*3/uL (ref 0.9–3.3)

## 2014-04-16 LAB — PROTEIN / CREATININE RATIO, URINE
Creatinine, Urine: 102.1 mg/dL
Protein Creatinine Ratio: 0.05 (ref <0.15)
Total Protein, Urine: 5 mg/dL

## 2014-04-17 ENCOUNTER — Other Ambulatory Visit: Payer: Self-pay | Admitting: *Deleted

## 2014-04-17 NOTE — Telephone Encounter (Signed)
THIS REFILL REQUEST FOR REVLIMID WAS GIVEN TO DR.MAGRINAT'S NURSE, VAL DODD,RN.

## 2014-04-20 LAB — PROTEIN ELECTROPHORESIS, SERUM
ALPHA-1-GLOBULIN: 3.8 % (ref 2.9–4.9)
ALPHA-2-GLOBULIN: 9.7 % (ref 7.1–11.8)
Albumin ELP: 56.4 % (ref 55.8–66.1)
BETA 2: 4.6 % (ref 3.2–6.5)
Beta Globulin: 7.3 % — ABNORMAL HIGH (ref 4.7–7.2)
Gamma Globulin: 18.2 % (ref 11.1–18.8)
M-Spike, %: 0.58 g/dL
Total Protein, Serum Electrophoresis: 6.5 g/dL (ref 6.0–8.3)

## 2014-04-20 LAB — KAPPA/LAMBDA LIGHT CHAINS
KAPPA FREE LGHT CHN: 4.96 mg/dL — AB (ref 0.33–1.94)
Kappa:Lambda Ratio: 0.72 (ref 0.26–1.65)
LAMBDA FREE LGHT CHN: 6.86 mg/dL — AB (ref 0.57–2.63)

## 2014-04-21 ENCOUNTER — Ambulatory Visit (HOSPITAL_BASED_OUTPATIENT_CLINIC_OR_DEPARTMENT_OTHER): Payer: Commercial Managed Care - PPO | Admitting: Oncology

## 2014-04-21 VITALS — BP 115/51 | HR 51 | Temp 97.5°F | Resp 18 | Ht 69.5 in | Wt 182.6 lb

## 2014-04-21 DIAGNOSIS — C9000 Multiple myeloma not having achieved remission: Secondary | ICD-10-CM

## 2014-04-21 DIAGNOSIS — E039 Hypothyroidism, unspecified: Secondary | ICD-10-CM

## 2014-04-21 DIAGNOSIS — R930 Abnormal findings on diagnostic imaging of skull and head, not elsewhere classified: Secondary | ICD-10-CM

## 2014-04-21 DIAGNOSIS — D61818 Other pancytopenia: Secondary | ICD-10-CM

## 2014-04-21 DIAGNOSIS — R9089 Other abnormal findings on diagnostic imaging of central nervous system: Secondary | ICD-10-CM

## 2014-04-21 NOTE — Progress Notes (Signed)
ID: Andre [name]   DOB: 09-11-1951  MR#: [id number]  YKD#:[id number]   PCP: [name] SU: OTHER MD: [name], [name], Dian Situ Brackbill  CHIEF COMPLAINT: Multiple myeloma CURRENT TREATMENT: Lenalidomide maintenance   HISTORY OF PRESENT ILLNESS: From the original intake nodes:  The patient was worked up for peptic ulcer disease in August of 2011, with significant bleeding and anemia.  The patient was Helicobacter pylori negative.  He received some epinephrine when he had his EGD and then started on Protonix.  [name] anemia slowly resolved so that by September, his hemoglobin was up to 10.9 and by earlier this month, his hemoglobin was up to 12.5.    As part of his general workup, he was found to have a slightly elevated globulin fraction.  In September, his total protein was 8.3 with an albumin of 3.8.  In January, the total protein was 8.4 and albumin 3.6.  With persistence of this slight abnormality, Dr. Olevia Perches obtained serum immunofixation and SPEP.  The SPTP showed an M-spike of 2.67 grams.  A total IgG was 4,190.  Total IgA low at 28.  Total IgM low at 28 also.  The immunofixation showed a monoclonal IgG lambda paraprotein.  There were also monoclonal free lambda light chains present. With this information, the patient was referred for further evaluation.   A diagnosis of myeloma was confirmed by bone marrow biopsy and subsequebnt treatment is as detailed below  INTERVAL HISTORY: Andre [name] returns today for followup of his multiple myeloma. He continues on lenalidomide, 10 mg daily, 21 days on, 7 days off. This is the third week of treatment. He is tolerating the lenalidomide well, with no change in his peripheral neuropathy (he has minimal residual neuropathy from earlier treatments), no insomnia, and only mild constipation, which is a chronic problem with.   REVIEW OF SYSTEMS: Andre [name] continues to work full-time. Is not exercising as vigorously as he used  to do. He does walk extensively several days a week. There have been no unusual headaches, visual changes, dizziness, imbalance, confusion, nausea, or vomiting. He denies any cough, phlegm production or pleurisy. There have been no fever or bleeding. His been no rash. A detailed review of systems today was otherwise noncontributory  PAST MEDICAL HISTORY: Past Medical History  Diagnosis Date  . Hypertension   . Hyperlipidemia   . Duodenal ulcer   . Multiple myeloma 07/04/2011  . Allergy   . Thyroid disease   . GERD (gastroesophageal reflux disease)   . Depression   . Anemia   Significant for peptic ulcer disease as noted above.  History of hyperlipidemia.  History of anxiety and depression.  History of GERD which is significantly improved with weight loss and history of reactive airway disease, possibly secondary to the GERD which also improved with weight loss.  There was a history of obesity, now much improved secondary to exercise and diet.   PAST SURGICAL HISTORY: Past Surgical History  Procedure Laterality Date  . Cardiolite study  11/25/2003    NORMAL  . Bone marrow transplant  2011    for MM  . Tonsillectomy      FAMILY HISTORY Family History  Problem Relation Age of Onset  . Throat cancer Mother   . Hypertension Father   . Stroke Father   . Asthma Father   . Diabetes Father   . Pancreatic cancer Brother   [name] father died at the age of 52 following a stroke.  [name] mother  died with cancer of the throat at the age of 17.   She had a history of depression and was a smoker.  Not clear how much alcohol she drank, according to the patient.  The patient had two brothers.  One died from the age of 42 from a "kidney problem."  The second one died at the age of 8 from pancreatic cancer. (This may have been duodenal cancer. 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  His wife of 37+ years was [name], but they are now divorced.   The patient has two daughters:  [name] who is 90, and General who is 48.  They both live here in [place]. 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  The patient is very close to his daughters.  Post[place]us has one daughter, [name], born 03/06/2013, apparently with Weber/Osler/Rendu The patient is not a church attender.  He does derive a great deal of support from friends at the "[employer]" which he attends very regularly, and also participates in Al-Anon even though actually there is no alcohol or drug problem in the family.  He participates in this group and he gets quite a bit of support from it.  He is considering marriage to "Carney" his significant other, in the spring of 2016    ADVANCED DIRECTIVES: In place  HEALTH MAINTENANCE: (updated 07/16/2013) History  Substance Use Topics  . Smoking status: Former Smoker -- 3 years    Quit date: 03/27/1969  . Smokeless tobacco: Never Used  . Alcohol Use: No     Colonoscopy:  PSA:  Sept 2014, normal/Dr. [name]  Lipid panel: Sept 2014/Dr. [name]   Allergies  Allergen Reactions  . Crestor [Rosuvastatin Calcium]     ADVERSE EFFECTS ON LIVER  . Lipitor [Atorvastatin Calcium]     Current Outpatient Prescriptions  Medication Sig Dispense Refill  . amLODipine (NORVASC) 2.5 MG tablet Take 1 tablet (2.5 mg total) by mouth daily.  30 tablet  5  . aspirin EC 81 MG tablet Take 81 mg by mouth every morning.      . Bisacodyl (DULCOLAX PO) Take 1 tablet by mouth.      . cetirizine (ZYRTEC) 10 MG tablet Take 10 mg by mouth daily.      . cholecalciferol (VITAMIN D) 1000 UNITS tablet take 1 tablet by mouth once daily  100 tablet  3  . diphenhydrAMINE (BENADRYL) 25 MG tablet Take 25 mg by mouth as needed for itching.      . diphenhydramine-acetaminophen (TYLENOL PM) 25-500 MG TABS  Take 1 tablet by mouth at bedtime.       [name] escitalopram (LEXAPRO) 10 MG tablet take 1 tablet by mouth once daily  30 tablet  11  . hydrocortisone (ANUSOL-HC) 25 MG suppository Place 1 suppository (25 mg total) rectally every 12 (twelve) hours.  10 suppository  1  . hydrocortisone (ANUSOL-HC) 25 MG suppository Place 1 suppository (25 mg total) rectally at bedtime as needed for hemorrhoids.  12 suppository  1  . ibuprofen (ADVIL,MOTRIN) 200 MG tablet Take 400 mg by mouth every 8 (eight) hours as needed for pain.       [name] lenalidomide (REVLIMID) 10 MG capsule Take one capsule daily for 21 days, then off for 7 days, then repeat  21 capsule  0  . levothyroxine (SYNTHROID, LEVOTHROID) 150 MCG tablet Take 1 tablet (150 mcg total) by mouth daily.  90 tablet  3  . mupirocin ointment (BACTROBAN) 2 % Place small amount into each nostril twice daily x 5 days.  After application, press sides of nose together and gently massage.  22 g  0  . omeprazole (PRILOSEC) 40 MG capsule Take 1 capsule (40 mg total) by mouth every morning.  30 capsule  2  . polyethylene glycol (MIRALAX / GLYCOLAX) packet Take 17 g by mouth as needed.       . tadalafil (CIALIS) 20 MG tablet Take 0.5-1 tablets (10-20 mg total) by mouth daily as needed for erectile dysfunction.  10 tablet  3  . [DISCONTINUED] fexofenadine (ALLEGRA) 60 MG tablet Take 180 mg by mouth daily.         No current facility-administered medications for this visit.    OBJECTIVE: Middle-aged white man in no acute distress Filed Vitals:   04/21/14 1610  BP: 115/51  Pulse: 51  Temp: 97.5 F (36.4 C)  Resp: 18   Body mass index is 26.59 kg/(m^2).  Sclerae unicteric, pupils equal and reactive Oropharynx clear and moist No cervical or supraclavicular adenopathy Lungs no rales or rhonchi Heart regular rate and rhythm Abd soft, nontender, positive bowel sounds MSK no focal spinal tenderness, no joint edema Neuro: nonfocal, well oriented, appropriate  affect     LAB RESULTS:  Lab Results  Component Value Date   WBC 2.5* 04/16/2014   NEUTROABS 0.8* 04/16/2014   HGB 11.1* 04/16/2014   HCT 33.3* 04/16/2014   MCV 99.1* 04/16/2014   PLT 84* 04/16/2014      Chemistry      Component Value Date/Time   NA 138 03/18/2014 0823   NA 136 09/17/2013 0940   K 4.1 03/18/2014 0823   K 3.5 09/17/2013 0940   CL 103 09/17/2013 0940   CL 107 03/10/2013 1352   CO2 22 03/18/2014 0823   CO2 26 09/17/2013 0940   BUN 22.1 03/18/2014 0823   BUN 19 09/17/2013 0940   CREATININE 1.2 03/18/2014 0823   CREATININE 1.2 09/17/2013 0940      Component Value Date/Time   CALCIUM 9.4 03/18/2014 0823   CALCIUM 8.7 09/17/2013 0940   ALKPHOS 65 03/18/2014 0823   ALKPHOS 62 06/16/2013 1547   AST 17 03/18/2014 0823   AST 26 06/16/2013 1547   ALT 16 03/18/2014 0823   ALT 23 06/16/2013 1547   BILITOT 0.33 03/18/2014 0823   BILITOT 0.5 06/16/2013 1547    -Spike, % g/dL  0.58    0.55    0.53    0.45    0.36    0.43    0.44    Lambda Free Lght Chn 0.57 - 2.63 mg/dL  6.86 (H)    6.86 (H)    4.89 (H)    6.11 (H)    5.15 (H)    3.95 (H)    4.88 (H)      STUDIES: No results found.  ASSESSMENT: 63 y.o.  [place] man with a history of multiple myeloma diagnosed February of 2012, with an initial M-spike of 2.6 g/dL, IFE showing an IgG lambda paraprotein and free lambda light chains in the urine. Cytogenetics showed trisomy 83. Bone marrow biopsy showed a 22% plasmacytosis. Treated with   (1) bortezomib (subcutaneously), lenalidomide, and dexamethasone, with repeat bone marrow biopsy May of 2012 showing 10% plasmacytosis   (2) high-dose chemotherapy with BCNU  and melphalan at [facility], followed by stem cell rescue July of 2012   (3) on zoledronic acid started December of 2012, initially monthly, currently given every 3 months, most recent dose 11/28/2013  (4) low-dose lenalidomide resumed April 2013, interrupted several times since.  Resumed again on 02/19/2013, and  a dose of 5 mg daily, 21 days on and 7 days off.  (5) CNS symptoms and abnormal brain MRI extensively evaluated by neurology with no definitive diagnosis established; improving   (6) rising M spike noted June 2014 but did not meet criteria for carfilzomib study  (7) on lenalidomide 25 mg daily, 14 days on, 7 days off, starting 04/18/2013, interrupted December 2014 because of rash; resumed January 2015 at 10 mg/ day at 21 days on/ 7 days off   PLAN:  Andre [name] is tolerating the lenalidomide remarkably well. There may be a very gradual upward trend, but the kappa lambda ratio remains normal and the M spike is still well under 1 g. We do have to watch his counts, but I would prefer not to drop his dose a less than neutropenia becomes more severe. We are going to follow his counts on an every two-week basis at this point. Of course we will continue to follow the SPEP and light chains every 4 weeks.  I have encouraged her to get back into his more usual vigorous exercise program. He is considering marriage in the spring of 2016, which is very positive thing from every point of view. Otherwise the plan is to continue lenalidomide to progression, at which point we will consider the carfilzomib study.  Andre [name] has a good understanding of the overall plan. He agrees with it. He knows the goal of treatment in his case is control. He will call with any problems that may develop before his next visit here. Chauncey Cruel, MD     04/21/2014

## 2014-04-22 ENCOUNTER — Telehealth: Payer: Self-pay | Admitting: Oncology

## 2014-04-22 NOTE — Addendum Note (Signed)
Addended by: Amelia Jo I on: 04/22/2014 09:16 AM   Modules accepted: Orders, Medications

## 2014-04-22 NOTE — Telephone Encounter (Signed)
cld & spoke to pt in re to sch set for labs-pt stated had MY CHART and will review other times & dates

## 2014-04-23 ENCOUNTER — Other Ambulatory Visit: Payer: Self-pay | Admitting: *Deleted

## 2014-04-23 ENCOUNTER — Telehealth: Payer: Self-pay | Admitting: *Deleted

## 2014-04-23 DIAGNOSIS — C9000 Multiple myeloma not having achieved remission: Secondary | ICD-10-CM

## 2014-04-23 MED ORDER — LENALIDOMIDE 10 MG PO CAPS: ORAL_CAPSULE | ORAL | Status: DC

## 2014-04-23 NOTE — Telephone Encounter (Signed)
Called pt on cell phone and work phone unsuccessfully.  Left messages on both voice mail for pt to call triage nurse back re: pt needs to call [employer] to take survey for Revlimid refill.

## 2014-05-05 ENCOUNTER — Other Ambulatory Visit (HOSPITAL_BASED_OUTPATIENT_CLINIC_OR_DEPARTMENT_OTHER): Payer: Commercial Managed Care - PPO

## 2014-05-05 DIAGNOSIS — J45909 Unspecified asthma, uncomplicated: Secondary | ICD-10-CM

## 2014-05-05 DIAGNOSIS — D649 Anemia, unspecified: Secondary | ICD-10-CM

## 2014-05-05 DIAGNOSIS — E039 Hypothyroidism, unspecified: Secondary | ICD-10-CM

## 2014-05-05 DIAGNOSIS — K279 Peptic ulcer, site unspecified, unspecified as acute or chronic, without hemorrhage or perforation: Secondary | ICD-10-CM

## 2014-05-05 DIAGNOSIS — C9000 Multiple myeloma not having achieved remission: Secondary | ICD-10-CM

## 2014-05-05 LAB — CBC WITH DIFFERENTIAL/PLATELET
BASO%: 1.1 % (ref 0.0–2.0)
Basophils Absolute: 0 10*3/uL (ref 0.0–0.1)
EOS%: 16.3 % — ABNORMAL HIGH (ref 0.0–7.0)
Eosinophils Absolute: 0.4 10*3/uL (ref 0.0–0.5)
HEMATOCRIT: 34.6 % — AB (ref 38.4–49.9)
HGB: 11.4 g/dL — ABNORMAL LOW (ref 13.0–17.1)
LYMPH%: 32 % (ref 14.0–49.0)
MCH: 32.8 pg (ref 27.2–33.4)
MCHC: 32.9 g/dL (ref 32.0–36.0)
MCV: 99.6 fL — ABNORMAL HIGH (ref 79.3–98.0)
MONO#: 0.3 10*3/uL (ref 0.1–0.9)
MONO%: 13.4 % (ref 0.0–14.0)
NEUT#: 0.8 10*3/uL — ABNORMAL LOW (ref 1.5–6.5)
NEUT%: 37.2 % — AB (ref 39.0–75.0)
PLATELETS: 78 10*3/uL — AB (ref 140–400)
RBC: 3.47 10*6/uL — AB (ref 4.20–5.82)
RDW: 17 % — ABNORMAL HIGH (ref 11.0–14.6)
WBC: 2.2 10*3/uL — ABNORMAL LOW (ref 4.0–10.3)
lymph#: 0.7 10*3/uL — ABNORMAL LOW (ref 0.9–3.3)

## 2014-05-07 ENCOUNTER — Telehealth: Payer: Self-pay | Admitting: *Deleted

## 2014-05-07 NOTE — Telephone Encounter (Signed)
Message left by pt stating request for a return call " due to issues with memory and I am supposed to drive to [place] today and am unsure what to do?"  This RN returned call to given number of [phone] and obtained identified VM-message left to return call.  This note will be given to MD for review.

## 2014-05-10 ENCOUNTER — Encounter: Payer: Self-pay | Admitting: Oncology

## 2014-05-18 ENCOUNTER — Other Ambulatory Visit: Payer: Self-pay | Admitting: *Deleted

## 2014-05-18 NOTE — Telephone Encounter (Signed)
THIS REFILL REQUEST FOR REVLIMID WAS GIVEN TO DR.MAGRINAT'S NURSE, STACEY CAMP,RN.

## 2014-05-19 ENCOUNTER — Other Ambulatory Visit (HOSPITAL_BASED_OUTPATIENT_CLINIC_OR_DEPARTMENT_OTHER): Payer: Commercial Managed Care - PPO

## 2014-05-19 ENCOUNTER — Other Ambulatory Visit: Payer: Self-pay | Admitting: *Deleted

## 2014-05-19 DIAGNOSIS — C9000 Multiple myeloma not having achieved remission: Secondary | ICD-10-CM

## 2014-05-19 DIAGNOSIS — K279 Peptic ulcer, site unspecified, unspecified as acute or chronic, without hemorrhage or perforation: Secondary | ICD-10-CM

## 2014-05-19 DIAGNOSIS — D649 Anemia, unspecified: Secondary | ICD-10-CM

## 2014-05-19 DIAGNOSIS — E039 Hypothyroidism, unspecified: Secondary | ICD-10-CM

## 2014-05-19 DIAGNOSIS — D61818 Other pancytopenia: Secondary | ICD-10-CM

## 2014-05-19 DIAGNOSIS — J45909 Unspecified asthma, uncomplicated: Secondary | ICD-10-CM

## 2014-05-19 LAB — CBC WITH DIFFERENTIAL/PLATELET
BASO%: 1.5 % (ref 0.0–2.0)
Basophils Absolute: 0 10*3/uL (ref 0.0–0.1)
EOS%: 10.3 % — AB (ref 0.0–7.0)
Eosinophils Absolute: 0.2 10*3/uL (ref 0.0–0.5)
HEMATOCRIT: 31.7 % — AB (ref 38.4–49.9)
HGB: 10.8 g/dL — ABNORMAL LOW (ref 13.0–17.1)
LYMPH%: 32.3 % (ref 14.0–49.0)
MCH: 33.1 pg (ref 27.2–33.4)
MCHC: 34.1 g/dL (ref 32.0–36.0)
MCV: 97.2 fL (ref 79.3–98.0)
MONO#: 0.2 10*3/uL (ref 0.1–0.9)
MONO%: 9.7 % (ref 0.0–14.0)
NEUT#: 0.9 10*3/uL — ABNORMAL LOW (ref 1.5–6.5)
NEUT%: 46.2 % (ref 39.0–75.0)
Platelets: 70 10*3/uL — ABNORMAL LOW (ref 140–400)
RBC: 3.26 10*6/uL — AB (ref 4.20–5.82)
RDW: 15.9 % — ABNORMAL HIGH (ref 11.0–14.6)
WBC: 2 10*3/uL — AB (ref 4.0–10.3)
lymph#: 0.6 10*3/uL — ABNORMAL LOW (ref 0.9–3.3)

## 2014-05-19 LAB — PROTEIN / CREATININE RATIO, URINE
Creatinine, Urine: 76.4 mg/dL
Protein Creatinine Ratio: 0.1 (ref <0.15)
Total Protein, Urine: 8 mg/dL (ref 5–25)

## 2014-05-19 MED ORDER — OMEPRAZOLE 40 MG PO CPDR: 40.0000 mg | DELAYED_RELEASE_CAPSULE | Freq: Every morning | ORAL | Status: DC

## 2014-05-19 MED ORDER — LENALIDOMIDE 10 MG PO CAPS: ORAL_CAPSULE | ORAL | Status: DC

## 2014-05-21 ENCOUNTER — Other Ambulatory Visit: Payer: Self-pay | Admitting: Oncology

## 2014-05-21 ENCOUNTER — Encounter: Payer: Self-pay | Admitting: Oncology

## 2014-05-21 LAB — PROTEIN ELECTROPHORESIS, SERUM
ALBUMIN ELP: 56.4 % (ref 55.8–66.1)
ALPHA-2-GLOBULIN: 9.5 % (ref 7.1–11.8)
Alpha-1-Globulin: 4 % (ref 2.9–4.9)
BETA GLOBULIN: 7.2 % (ref 4.7–7.2)
Beta 2: 4.4 % (ref 3.2–6.5)
Gamma Globulin: 18.5 % (ref 11.1–18.8)
M-Spike, %: 0.58 g/dL
Total Protein, Serum Electrophoresis: 6.2 g/dL (ref 6.0–8.3)

## 2014-05-21 LAB — KAPPA/LAMBDA LIGHT CHAINS
KAPPA FREE LGHT CHN: 5.01 mg/dL — AB (ref 0.33–1.94)
Kappa:Lambda Ratio: 0.73 (ref 0.26–1.65)
Lambda Free Lght Chn: 6.83 mg/dL — ABNORMAL HIGH (ref 0.57–2.63)

## 2014-06-02 ENCOUNTER — Ambulatory Visit (HOSPITAL_BASED_OUTPATIENT_CLINIC_OR_DEPARTMENT_OTHER): Payer: Commercial Managed Care - PPO

## 2014-06-02 DIAGNOSIS — D649 Anemia, unspecified: Secondary | ICD-10-CM

## 2014-06-02 DIAGNOSIS — E039 Hypothyroidism, unspecified: Secondary | ICD-10-CM

## 2014-06-02 DIAGNOSIS — C9000 Multiple myeloma not having achieved remission: Secondary | ICD-10-CM

## 2014-06-02 DIAGNOSIS — K279 Peptic ulcer, site unspecified, unspecified as acute or chronic, without hemorrhage or perforation: Secondary | ICD-10-CM

## 2014-06-02 DIAGNOSIS — J45909 Unspecified asthma, uncomplicated: Secondary | ICD-10-CM

## 2014-06-02 LAB — CBC WITH DIFFERENTIAL/PLATELET
BASO%: 0.9 % (ref 0.0–2.0)
Basophils Absolute: 0 10*3/uL (ref 0.0–0.1)
EOS ABS: 0.4 10*3/uL (ref 0.0–0.5)
EOS%: 17.5 % — ABNORMAL HIGH (ref 0.0–7.0)
HCT: 32.6 % — ABNORMAL LOW (ref 38.4–49.9)
HGB: 11.1 g/dL — ABNORMAL LOW (ref 13.0–17.1)
LYMPH%: 35.9 % (ref 14.0–49.0)
MCH: 33.2 pg (ref 27.2–33.4)
MCHC: 34 g/dL (ref 32.0–36.0)
MCV: 97.6 fL (ref 79.3–98.0)
MONO#: 0.4 10*3/uL (ref 0.1–0.9)
MONO%: 17.9 % — ABNORMAL HIGH (ref 0.0–14.0)
NEUT%: 27.8 % — ABNORMAL LOW (ref 39.0–75.0)
NEUTROS ABS: 0.7 10*3/uL — AB (ref 1.5–6.5)
PLATELETS: 64 10*3/uL — AB (ref 140–400)
RBC: 3.34 10*6/uL — ABNORMAL LOW (ref 4.20–5.82)
RDW: 16.1 % — ABNORMAL HIGH (ref 11.0–14.6)
WBC: 2.3 10*3/uL — AB (ref 4.0–10.3)
lymph#: 0.8 10*3/uL — ABNORMAL LOW (ref 0.9–3.3)

## 2014-06-02 LAB — TECHNOLOGIST REVIEW

## 2014-06-03 ENCOUNTER — Encounter: Payer: Self-pay | Admitting: Oncology

## 2014-06-08 ENCOUNTER — Other Ambulatory Visit: Payer: Self-pay | Admitting: Oncology

## 2014-06-12 ENCOUNTER — Other Ambulatory Visit: Payer: Self-pay | Admitting: *Deleted

## 2014-06-12 DIAGNOSIS — C9000 Multiple myeloma not having achieved remission: Secondary | ICD-10-CM

## 2014-06-12 MED ORDER — LENALIDOMIDE 10 MG PO CAPS: ORAL_CAPSULE | ORAL | Status: DC

## 2014-06-16 ENCOUNTER — Other Ambulatory Visit: Payer: Commercial Managed Care - PPO

## 2014-06-16 DIAGNOSIS — K279 Peptic ulcer, site unspecified, unspecified as acute or chronic, without hemorrhage or perforation: Secondary | ICD-10-CM

## 2014-06-16 DIAGNOSIS — J45909 Unspecified asthma, uncomplicated: Secondary | ICD-10-CM

## 2014-06-16 DIAGNOSIS — E039 Hypothyroidism, unspecified: Secondary | ICD-10-CM

## 2014-06-16 DIAGNOSIS — D649 Anemia, unspecified: Secondary | ICD-10-CM

## 2014-06-16 DIAGNOSIS — C9000 Multiple myeloma not having achieved remission: Secondary | ICD-10-CM

## 2014-06-16 LAB — PROTEIN / CREATININE RATIO, URINE
Creatinine, Urine: 106.2 mg/dL
PROTEIN CREATININE RATIO: 0.15 — AB (ref <0.15)
Total Protein, Urine: 16 mg/dL (ref 5–25)

## 2014-06-23 ENCOUNTER — Telehealth: Payer: Self-pay | Admitting: Nurse Practitioner

## 2014-06-23 ENCOUNTER — Other Ambulatory Visit: Payer: Self-pay | Admitting: *Deleted

## 2014-06-23 ENCOUNTER — Other Ambulatory Visit (HOSPITAL_BASED_OUTPATIENT_CLINIC_OR_DEPARTMENT_OTHER): Payer: Commercial Managed Care - PPO

## 2014-06-23 ENCOUNTER — Ambulatory Visit (HOSPITAL_BASED_OUTPATIENT_CLINIC_OR_DEPARTMENT_OTHER): Payer: Commercial Managed Care - PPO | Admitting: Nurse Practitioner

## 2014-06-23 ENCOUNTER — Encounter: Payer: Self-pay | Admitting: Nurse Practitioner

## 2014-06-23 VITALS — BP 121/69 | HR 61 | Temp 98.2°F | Resp 18 | Ht 69.5 in | Wt 183.6 lb

## 2014-06-23 DIAGNOSIS — C9000 Multiple myeloma not having achieved remission: Secondary | ICD-10-CM

## 2014-06-23 LAB — CBC WITH DIFFERENTIAL/PLATELET
BASO%: 1.2 % (ref 0.0–2.0)
Basophils Absolute: 0 10*3/uL (ref 0.0–0.1)
EOS%: 9.7 % — ABNORMAL HIGH (ref 0.0–7.0)
Eosinophils Absolute: 0.4 10*3/uL (ref 0.0–0.5)
HCT: 34.5 % — ABNORMAL LOW (ref 38.4–49.9)
HGB: 11.3 g/dL — ABNORMAL LOW (ref 13.0–17.1)
LYMPH#: 1.4 10*3/uL (ref 0.9–3.3)
LYMPH%: 37 % (ref 14.0–49.0)
MCH: 32.9 pg (ref 27.2–33.4)
MCHC: 32.7 g/dL (ref 32.0–36.0)
MCV: 100.4 fL — ABNORMAL HIGH (ref 79.3–98.0)
MONO#: 0.5 10*3/uL (ref 0.1–0.9)
MONO%: 12.7 % (ref 0.0–14.0)
NEUT#: 1.5 10*3/uL (ref 1.5–6.5)
NEUT%: 39.4 % (ref 39.0–75.0)
Platelets: 92 10*3/uL — ABNORMAL LOW (ref 140–400)
RBC: 3.43 10*6/uL — AB (ref 4.20–5.82)
RDW: 16.6 % — ABNORMAL HIGH (ref 11.0–14.6)
WBC: 3.7 10*3/uL — AB (ref 4.0–10.3)

## 2014-06-23 NOTE — Telephone Encounter (Signed)
, °

## 2014-06-23 NOTE — Progress Notes (Signed)
ID: Andre [name]   DOB: 10/08/1950  MR#: [id number]  KY[facility]#:[id number]   PCP: Cristie Hem [name] SU: OTHER MD: [name], [name], Shanon Rosser  CHIEF COMPLAINT: Multiple myeloma CURRENT TREATMENT: Lenalidomide maintenance   HISTORY OF PRESENT ILLNESS: From the original intake nodes:  The patient was worked up for peptic ulcer disease in August of 2011, with significant bleeding and anemia.  The patient was Helicobacter pylori negative.  He received some epinephrine when he had his EGD and then started on Protonix.  [name] anemia slowly resolved so that by September, his hemoglobin was up to 10.9 and by earlier this month, his hemoglobin was up to 12.5.    As part of his general workup, he was found to have a slightly elevated globulin fraction.  In September, his total protein was 8.3 with an albumin of 3.8.  In January, the total protein was 8.4 and albumin 3.6.  With persistence of this slight abnormality, Dr. Olevia Perches obtained serum immunofixation and SPEP.  The SPTP showed an M-spike of 2.67 grams.  A total IgG was 4,190.  Total IgA low at 28.  Total IgM low at 28 also.  The immunofixation showed a monoclonal IgG lambda paraprotein.  There were also monoclonal free lambda light chains present. With this information, the patient was referred for further evaluation.   A diagnosis of myeloma was confirmed by bone marrow biopsy and subsequebnt treatment is as detailed below  INTERVAL HISTORY: Andre [name] returns today for followup of his multiple myeloma. He continues on lenalidomide, 10 mg daily, 21 days on, 7 days off. This is his second week of this cycle. He is tolerating the lenalidomide well. His main complaint is how "foggy" his train of thought can be at times. This is not persistent or frequent, but when it occurs he feels the need to leave work.    REVIEW OF SYSTEMS: Andre [name] denies fevers, chills, nausea, vomiting, or changes in bowel or bladder habits. He has no  shortness of breath, chest pain, cough, palpitations, or fatigue. He has had some rare occurences of dizziness and mild headaches. He denies losing balance, confusion, or vision changes. He walks on the treadmill 3-4 times per week. A detailed review of systems is otherwise noncontributory.  PAST MEDICAL HISTORY: Past Medical History  Diagnosis Date  . Hypertension   . Hyperlipidemia   . Duodenal ulcer   . Multiple myeloma 07/04/2011  . Allergy   . Thyroid disease   . GERD (gastroesophageal reflux disease)   . Depression   . Anemia   Significant for peptic ulcer disease as noted above.  History of hyperlipidemia.  History of anxiety and depression.  History of GERD which is significantly improved with weight loss and history of reactive airway disease, possibly secondary to the GERD which also improved with weight loss.  There was a history of obesity, now much improved secondary to exercise and diet.   PAST SURGICAL HISTORY: Past Surgical History  Procedure Laterality Date  . Cardiolite study  11/25/2003    NORMAL  . Bone marrow transplant  2011    for MM  . Tonsillectomy      FAMILY HISTORY Family History  Problem Relation Age of Onset  . Throat cancer Mother   . Hypertension Father   . Stroke Father   . Asthma Father   . Diabetes Father   . Pancreatic cancer Brother   [name] father died at the age of 46 following a stroke.  The  patient's mother died with cancer of the throat at the age of 23.   She had a history of depression and was a smoker.  Not clear how much alcohol she drank, according to the patient.  The patient had two brothers.  One died from the age of 3 from a "kidney problem."  The second one died at the age of 82 from pancreatic cancer. (This may have been duodenal cancer. 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  His wife of 37+ years was [name], but they are now divorced.   The patient has two daughters:  [name] who is 99, and Seddon who is 103.  They both live here in [place]. 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  The patient is very close to his daughters.  Crace has one daughter, [name], born 03/06/2013, apparently with [employer] The patient is not a church attender.  He does derive a great deal of support from friends at the "[employer] which he attends very regularly, and also participates in Al-Anon even though actually there is no alcohol or drug problem in the family.  He participates in this group and he gets quite a bit of support from it.  He is considering marriage to "Carney" his significant other, in the spring of 2016    ADVANCED DIRECTIVES: In place  HEALTH MAINTENANCE: (updated 07/16/2013) History  Substance Use Topics  . Smoking status: Former Smoker -- 3 years    Quit date: 03/27/1969  . Smokeless tobacco: Never Used  . Alcohol Use: No     Colonoscopy:  PSA:  Sept 2014, normal/Dr. [name]  Lipid panel: Sept 2014/Dr. [name]   Allergies  Allergen Reactions  . Crestor [Rosuvastatin Calcium]     ADVERSE EFFECTS ON LIVER  . Lipitor [Atorvastatin Calcium]     Current Outpatient Prescriptions  Medication Sig Dispense Refill  . aspirin EC 81 MG tablet Take 81 mg by mouth every morning.      . Bisacodyl (DULCOLAX PO) Take 1 tablet by mouth.      . cetirizine (ZYRTEC) 10 MG tablet Take 10 mg by mouth daily.      . cholecalciferol (VITAMIN D) 1000 UNITS tablet take 1 tablet by mouth once daily  100 tablet  3  . escitalopram (LEXAPRO) 10 MG tablet take 1 tablet by mouth once daily  30 tablet  11  . ibuprofen (ADVIL,MOTRIN) 200 MG tablet Take 400 mg by mouth every 8 (eight) hours as needed for pain.       [name] lenalidomide (REVLIMID) 10 MG capsule Take one capsule daily for 21 days, then off for 7  days, then repeat  21 capsule  0  . levothyroxine (SYNTHROID, LEVOTHROID) 150 MCG tablet Take 1 tablet (150 mcg total) by mouth daily.  90 tablet  3  . mupirocin ointment (BACTROBAN) 2 % Place small amount into each nostril twice daily x 5 days.  After application, press sides of nose together and gently massage.  22 g  0  . omeprazole (PRILOSEC) 40 MG capsule Take 1 capsule (40 mg total) by mouth every morning.  30 capsule  2  . tadalafil (CIALIS) 20 MG tablet Take 0.5-1 tablets (10-20 mg total) by mouth daily as needed for erectile dysfunction.  10 tablet  3  .  polyethylene glycol (MIRALAX / GLYCOLAX) packet Take 17 g by mouth as needed.       . [DISCONTINUED] fexofenadine (ALLEGRA) 60 MG tablet Take 180 mg by mouth daily.         No current facility-administered medications for this visit.    OBJECTIVE: Middle-aged white man in no acute distress Filed Vitals:   06/23/14 1311  BP: 121/69  Pulse: 61  Temp: 98.2 F (36.8 C)  Resp: 18   Body mass index is 26.73 kg/(m^2).  Skin: warm, dry  HEENT: sclerae anicteric, conjunctivae pink, oropharynx clear. No thrush or mucositis.  Lymph Nodes: No cervical or supraclavicular lymphadenopathy  Lungs: clear to auscultation bilaterally, no rales, wheezes, or rhonci  Heart: regular rate and rhythm  Abdomen: round, soft, non tender, positive bowel sounds  Musculoskeletal: No focal spinal tenderness, no peripheral edema  Neuro: non focal, well oriented, positive affect   LAB RESULTS:  Lab Results  Component Value Date   WBC 3.7* 06/23/2014   NEUTROABS 1.5 06/23/2014   HGB 11.3* 06/23/2014   HCT 34.5* 06/23/2014   MCV 100.4* 06/23/2014   PLT 92* 06/23/2014      Chemistry      Component Value Date/Time   NA 138 03/18/2014 0823   NA 136 09/17/2013 0940   K 4.1 03/18/2014 0823   K 3.5 09/17/2013 0940   CL 103 09/17/2013 0940   CL 107 03/10/2013 1352   CO2 22 03/18/2014 0823   CO2 26 09/17/2013 0940   BUN 22.1 03/18/2014 0823   BUN 19 09/17/2013  0940   CREATININE 1.2 03/18/2014 0823   CREATININE 1.2 09/17/2013 0940      Component Value Date/Time   CALCIUM 9.4 03/18/2014 0823   CALCIUM 8.7 09/17/2013 0940   ALKPHOS 65 03/18/2014 0823   ALKPHOS 62 06/16/2013 1547   AST 17 03/18/2014 0823   AST 26 06/16/2013 1547   ALT 16 03/18/2014 0823   ALT 23 06/16/2013 1547   BILITOT 0.33 03/18/2014 0823   BILITOT 0.5 06/16/2013 1547    -Spike, % g/dL  0.58    0.55    0.53    0.45    0.36    0.43    0.44    Lambda Free Lght Chn 0.57 - 2.63 mg/dL  6.86 (H)    6.86 (H)    4.89 (H)    6.11 (H)    5.15 (H)    3.95 (H)    4.88 (H)      STUDIES: No results found.  ASSESSMENT: 63 y.o.  [place] man with a history of multiple myeloma diagnosed February of 2012, with an initial M-spike of 2.6 g/dL, IFE showing an IgG lambda paraprotein and free lambda light chains in the urine. Cytogenetics showed trisomy 60. Bone marrow biopsy showed a 22% plasmacytosis. Treated with   (1) bortezomib (subcutaneously), lenalidomide, and dexamethasone, with repeat bone marrow biopsy May of 2012 showing 10% plasmacytosis   (2) high-dose chemotherapy with BCNU and melphalan at [facility], followed by stem cell rescue July of 2012   (3) on zoledronic acid started December of 2012, initially monthly, currently given every 3 months, most recent dose 11/28/2013  (4) low-dose lenalidomide resumed April 2013, interrupted several times since.  Resumed again on 02/19/2013, and a dose of 5 mg daily, 21 days on and 7 days off.  (5) CNS symptoms and abnormal brain MRI extensively evaluated by neurology with no definitive diagnosis established; improving   (6) rising M spike noted  June 2014 but did not meet criteria for carfilzomib study  (7) on lenalidomide 25 mg daily, 14 days on, 7 days off, starting 04/18/2013, interrupted December 2014 because of rash; resumed January 2015 at 10 mg/ day at 21 days on/ 7 days off   PLAN:  Andre [name] continues to tolerate the  lenalidomide well. A CBC was drawn today and his hgb, ANC, and platelet count were all the best that they have been in months. He will continue to have CBC and CMET drawn every 2 weeks. We will have the SPEP, kappa/lambda light chains, and protien/creatinine ratios performed monthly, next due at his next lab visit. His previous labs showed that the kappa/lambda ratio remained normal, but the M spike was still well under 1.0, though there has been an upward trend.   The plan is to continue the lenalidomide to progression. At that point, consideration will be given to the carfilzomib study. [name] understands and agrees with this plan. He knows the goal of treatment in his case is control. He has been encouraged to call with any issues that might arise before his next visit here.   Marcelino Duster, NP     06/23/2014

## 2014-06-24 ENCOUNTER — Other Ambulatory Visit: Payer: Self-pay | Admitting: *Deleted

## 2014-06-29 ENCOUNTER — Other Ambulatory Visit: Payer: Self-pay | Admitting: Internal Medicine

## 2014-06-30 ENCOUNTER — Other Ambulatory Visit: Payer: Commercial Managed Care - PPO

## 2014-07-03 ENCOUNTER — Ambulatory Visit (INDEPENDENT_AMBULATORY_CARE_PROVIDER_SITE_OTHER): Payer: Commercial Managed Care - PPO | Admitting: Internal Medicine

## 2014-07-03 ENCOUNTER — Encounter: Payer: Self-pay | Admitting: Internal Medicine

## 2014-07-03 VITALS — BP 120/78 | HR 58 | Temp 98.3°F | Ht 70.0 in | Wt 184.0 lb

## 2014-07-03 DIAGNOSIS — Z23 Encounter for immunization: Secondary | ICD-10-CM

## 2014-07-03 DIAGNOSIS — Z0000 Encounter for general adult medical examination without abnormal findings: Secondary | ICD-10-CM

## 2014-07-03 MED ORDER — SILDENAFIL CITRATE 100 MG PO TABS: 100.0000 mg | ORAL_TABLET | ORAL | Status: DC | PRN

## 2014-07-03 MED ORDER — LEVOTHYROXINE SODIUM 150 MCG PO TABS: 150.0000 ug | ORAL_TABLET | Freq: Every day | ORAL | Status: DC

## 2014-07-03 NOTE — Progress Notes (Signed)
Subjective:     HPI  The patient is here for a wellness exam.   Getting re-married soon F/u poor circulation - fingers are turning white in the cold   Dr [name] has reduced his Revlimid dose from 25 to 10 mg  The patient  needs to address  chronic hypertension that has been well controlled with medicines; to address chronic fatigue, insomnia,  Hyperlipidemia  F/u  Depression - better.  BP Readings from Last 3 Encounters:  07/03/14 120/78  06/23/14 121/69  04/21/14 115/51   Wt Readings from Last 3 Encounters:  07/03/14 184 lb (83.462 kg)  06/23/14 183 lb 9.6 oz (83.28 kg)  04/21/14 182 lb 9.6 oz (82.827 kg)       Review of Systems  Constitutional: Positive for fatigue. Negative for diaphoresis, appetite change and unexpected weight change.  HENT: Negative for congestion, facial swelling, nosebleeds, sneezing, sore throat and trouble swallowing.   Eyes: Negative for itching and visual disturbance.  Respiratory: Negative for cough, chest tightness and wheezing.   Cardiovascular: Negative for chest pain, palpitations and leg swelling.  Gastrointestinal: Negative for nausea, vomiting, diarrhea, blood in stool and abdominal distention.  Genitourinary: Negative for dysuria, urgency, frequency and hematuria.  Musculoskeletal: Negative for back pain, gait problem, joint swelling and neck pain.  Skin: Positive for pallor and rash.  Neurological: Positive for weakness. Negative for dizziness, tremors and speech difficulty.  Psychiatric/Behavioral: Positive for dysphoric mood. Negative for suicidal ideas, sleep disturbance and agitation. The patient is nervous/anxious.        Objective:   Physical Exam  Constitutional: He is oriented to person, place, and time. He appears well-developed. No distress.  Appears tired  HENT:  Mouth/Throat: Oropharynx is clear and moist.  Eyes: Conjunctivae are normal. Pupils are equal, round, and reactive to light.  Neck: Normal range of  motion. No JVD present. No thyromegaly present.  Cardiovascular: Normal rate, regular rhythm, normal heart sounds and intact distal pulses.  Exam reveals no gallop and no friction rub.   No murmur heard. 4 fingertips are white and cool  Pulmonary/Chest: Effort normal and breath sounds normal. No respiratory distress. He has no wheezes. He has no rales. He exhibits no tenderness.  Abdominal: Soft. Bowel sounds are normal. He exhibits no distension and no mass. There is no tenderness. There is no rebound and no guarding.  Genitourinary: Rectum normal and prostate normal. Guaiac negative stool.  Musculoskeletal: Normal range of motion. He exhibits no edema and no tenderness.  Lymphadenopathy:    He has no cervical adenopathy.  Neurological: He is alert and oriented to person, place, and time. He has normal reflexes. No cranial nerve deficit. He exhibits normal muscle tone. Coordination normal.  Skin: Skin is warm and dry. Rash noted.  Residual faint rash on back  Psychiatric: He has a normal mood and affect. His behavior is normal. Judgment and thought content normal.    Lab Results  Component Value Date   WBC 3.7* 06/23/2014   HGB 11.3* 06/23/2014   HCT 34.5* 06/23/2014   PLT 92* 06/23/2014   GLUCOSE 100 03/18/2014   CHOL 211* 11/13/2012   TRIG 73.0 11/13/2012   HDL 44.70 11/13/2012   LDLDIRECT 155.8 11/13/2012   LDLCALC 84 03/28/2012   ALT 16 03/18/2014   AST 17 03/18/2014   NA 138 03/18/2014   K 4.1 03/18/2014   CL 103 09/17/2013   CREATININE 1.2 03/18/2014   BUN 22.1 03/18/2014   CO2 22 03/18/2014  TSH 0.08* 09/17/2013   PSA 0.95 06/16/2013   INR 0.91 03/31/2013   HGBA1C 5.4 03/27/2012          Assessment & Plan:

## 2014-07-03 NOTE — Progress Notes (Signed)
Pre visit review using our clinic review tool, if applicable. No additional management support is needed unless otherwise documented below in the visit note. 

## 2014-07-07 ENCOUNTER — Other Ambulatory Visit: Payer: Self-pay | Admitting: *Deleted

## 2014-07-07 ENCOUNTER — Other Ambulatory Visit (HOSPITAL_BASED_OUTPATIENT_CLINIC_OR_DEPARTMENT_OTHER): Payer: Commercial Managed Care - PPO

## 2014-07-07 DIAGNOSIS — C9000 Multiple myeloma not having achieved remission: Secondary | ICD-10-CM

## 2014-07-07 LAB — CBC WITH DIFFERENTIAL/PLATELET
BASO%: 1.3 % (ref 0.0–2.0)
Basophils Absolute: 0 10*3/uL (ref 0.0–0.1)
EOS%: 6.6 % (ref 0.0–7.0)
Eosinophils Absolute: 0.2 10*3/uL (ref 0.0–0.5)
HCT: 34.4 % — ABNORMAL LOW (ref 38.4–49.9)
HGB: 11.2 g/dL — ABNORMAL LOW (ref 13.0–17.1)
LYMPH%: 42.2 % (ref 14.0–49.0)
MCH: 32.5 pg (ref 27.2–33.4)
MCHC: 32.5 g/dL (ref 32.0–36.0)
MCV: 100 fL — ABNORMAL HIGH (ref 79.3–98.0)
MONO#: 0.4 10*3/uL (ref 0.1–0.9)
MONO%: 15 % — ABNORMAL HIGH (ref 0.0–14.0)
NEUT#: 0.9 10*3/uL — ABNORMAL LOW (ref 1.5–6.5)
NEUT%: 34.9 % — ABNORMAL LOW (ref 39.0–75.0)
Platelets: 85 10*3/uL — ABNORMAL LOW (ref 140–400)
RBC: 3.44 10*6/uL — ABNORMAL LOW (ref 4.20–5.82)
RDW: 16.5 % — ABNORMAL HIGH (ref 11.0–14.6)
WBC: 2.7 10*3/uL — ABNORMAL LOW (ref 4.0–10.3)
lymph#: 1.1 10*3/uL (ref 0.9–3.3)

## 2014-07-07 LAB — COMPREHENSIVE METABOLIC PANEL (CC13)
ALT: 16 U/L (ref 0–55)
AST: 18 U/L (ref 5–34)
Albumin: 3.2 g/dL — ABNORMAL LOW (ref 3.5–5.0)
Alkaline Phosphatase: 76 U/L (ref 40–150)
Anion Gap: 7 meq/L (ref 3–11)
BUN: 20.7 mg/dL (ref 7.0–26.0)
CO2: 19 meq/L — ABNORMAL LOW (ref 22–29)
Calcium: 9 mg/dL (ref 8.4–10.4)
Chloride: 113 meq/L — ABNORMAL HIGH (ref 98–109)
Creatinine: 1.2 mg/dL (ref 0.7–1.3)
Glucose: 116 mg/dL (ref 70–140)
Potassium: 4.1 meq/L (ref 3.5–5.1)
Sodium: 138 meq/L (ref 136–145)
Total Bilirubin: 0.37 mg/dL (ref 0.20–1.20)
Total Protein: 6.7 g/dL (ref 6.4–8.3)

## 2014-07-07 LAB — PROTEIN / CREATININE RATIO, URINE
Creatinine, Urine: 96.4 mg/dL
Protein Creatinine Ratio: 0.13 (ref <0.15)
TOTAL PROTEIN, URINE: 13 mg/dL (ref 5–25)

## 2014-07-07 NOTE — Telephone Encounter (Signed)
THIS REFILL REQUEST FOR REVLIMID WAS GIVEN TO DR.MAGRINAT'S NURSE, DIANA BURLESON,RN.

## 2014-07-08 ENCOUNTER — Other Ambulatory Visit: Payer: Self-pay | Admitting: *Deleted

## 2014-07-08 DIAGNOSIS — C9000 Multiple myeloma not having achieved remission: Secondary | ICD-10-CM

## 2014-07-08 MED ORDER — LENALIDOMIDE 10 MG PO CAPS: ORAL_CAPSULE | ORAL | Status: DC

## 2014-07-08 NOTE — Telephone Encounter (Signed)
[facility] faxed revlimid refill request.  Request to provider's desk/in-basket for review and approved.  Authorization number obtained indicates patient is required to take survey for authorization number to be valid.  Called [phone], voicemail left with [employer] number and instructions to take patient survey.

## 2014-07-09 LAB — PROTEIN ELECTROPHORESIS, SERUM
Albumin ELP: 53.6 % — ABNORMAL LOW (ref 55.8–66.1)
Alpha-1-Globulin: 4.3 % (ref 2.9–4.9)
Alpha-2-Globulin: 9.8 % (ref 7.1–11.8)
Beta 2: 5.3 % (ref 3.2–6.5)
Beta Globulin: 6.8 % (ref 4.7–7.2)
Gamma Globulin: 20.2 % — ABNORMAL HIGH (ref 11.1–18.8)
M-Spike, %: 0.69 g/dL
Total Protein, Serum Electrophoresis: 6.6 g/dL (ref 6.0–8.3)

## 2014-07-09 LAB — KAPPA/LAMBDA LIGHT CHAINS
Kappa free light chain: 3.62 mg/dL — ABNORMAL HIGH (ref 0.33–1.94)
Kappa:Lambda Ratio: 0.53 (ref 0.26–1.65)
Lambda Free Lght Chn: 6.87 mg/dL — ABNORMAL HIGH (ref 0.57–2.63)

## 2014-07-12 NOTE — Assessment & Plan Note (Signed)
We discussed age appropriate health related issues, including available/recomended screening tests and vaccinations. All questions were answered.

## 2014-07-14 ENCOUNTER — Other Ambulatory Visit: Payer: Commercial Managed Care - PPO

## 2014-07-17 ENCOUNTER — Other Ambulatory Visit: Payer: Self-pay | Admitting: *Deleted

## 2014-07-17 DIAGNOSIS — C9000 Multiple myeloma not having achieved remission: Secondary | ICD-10-CM

## 2014-07-20 ENCOUNTER — Telehealth: Payer: Self-pay | Admitting: Oncology

## 2014-07-20 ENCOUNTER — Other Ambulatory Visit (HOSPITAL_BASED_OUTPATIENT_CLINIC_OR_DEPARTMENT_OTHER): Payer: Commercial Managed Care - PPO

## 2014-07-20 ENCOUNTER — Telehealth: Payer: Self-pay | Admitting: Nephrology

## 2014-07-20 ENCOUNTER — Ambulatory Visit (HOSPITAL_BASED_OUTPATIENT_CLINIC_OR_DEPARTMENT_OTHER): Payer: Commercial Managed Care - PPO | Admitting: Oncology

## 2014-07-20 VITALS — BP 113/69 | HR 61 | Temp 97.7°F | Resp 18 | Ht 70.0 in | Wt 181.7 lb

## 2014-07-20 DIAGNOSIS — H538 Other visual disturbances: Secondary | ICD-10-CM

## 2014-07-20 DIAGNOSIS — R930 Abnormal findings on diagnostic imaging of skull and head, not elsewhere classified: Secondary | ICD-10-CM

## 2014-07-20 DIAGNOSIS — C9000 Multiple myeloma not having achieved remission: Secondary | ICD-10-CM

## 2014-07-20 DIAGNOSIS — R9089 Other abnormal findings on diagnostic imaging of central nervous system: Secondary | ICD-10-CM

## 2014-07-20 DIAGNOSIS — D61818 Other pancytopenia: Secondary | ICD-10-CM

## 2014-07-20 DIAGNOSIS — R42 Dizziness and giddiness: Secondary | ICD-10-CM

## 2014-07-20 LAB — CBC WITH DIFFERENTIAL/PLATELET
BASO%: 0.4 % (ref 0.0–2.0)
BASOS ABS: 0 10*3/uL (ref 0.0–0.1)
EOS ABS: 0.2 10*3/uL (ref 0.0–0.5)
EOS%: 7.1 % — ABNORMAL HIGH (ref 0.0–7.0)
HCT: 34.7 % — ABNORMAL LOW (ref 38.4–49.9)
HEMOGLOBIN: 11.3 g/dL — AB (ref 13.0–17.1)
LYMPH%: 33.2 % (ref 14.0–49.0)
MCH: 32.7 pg (ref 27.2–33.4)
MCHC: 32.5 g/dL (ref 32.0–36.0)
MCV: 100.7 fL — AB (ref 79.3–98.0)
MONO#: 0.4 10*3/uL (ref 0.1–0.9)
MONO%: 12.8 % (ref 0.0–14.0)
NEUT%: 46.5 % (ref 39.0–75.0)
NEUTROS ABS: 1.5 10*3/uL (ref 1.5–6.5)
PLATELETS: 101 10*3/uL — AB (ref 140–400)
RBC: 3.45 10*6/uL — ABNORMAL LOW (ref 4.20–5.82)
RDW: 16.6 % — ABNORMAL HIGH (ref 11.0–14.6)
WBC: 3.3 10*3/uL — ABNORMAL LOW (ref 4.0–10.3)
lymph#: 1.1 10*3/uL (ref 0.9–3.3)

## 2014-07-20 LAB — COMPREHENSIVE METABOLIC PANEL (CC13)
ALBUMIN: 3.5 g/dL (ref 3.5–5.0)
ALK PHOS: 77 U/L (ref 40–150)
ALT: 25 U/L (ref 0–55)
AST: 23 U/L (ref 5–34)
Anion Gap: 6 mEq/L (ref 3–11)
BILIRUBIN TOTAL: 0.26 mg/dL (ref 0.20–1.20)
BUN: 15.7 mg/dL (ref 7.0–26.0)
CO2: 24 mEq/L (ref 22–29)
Calcium: 9.4 mg/dL (ref 8.4–10.4)
Chloride: 109 mEq/L (ref 98–109)
Creatinine: 1.2 mg/dL (ref 0.7–1.3)
GLUCOSE: 67 mg/dL — AB (ref 70–140)
Potassium: 4.3 mEq/L (ref 3.5–5.1)
Sodium: 140 mEq/L (ref 136–145)
Total Protein: 7 g/dL (ref 6.4–8.3)

## 2014-07-20 NOTE — Progress Notes (Signed)
ID: Andre [name]   DOB: 1951-04-18  MR#: [id number]  ALP#:[id number]   PCP: [name] SU: OTHER MD: [name], [name], Dian Situ Nabil, Bubolz Gollehon [phone])  CHIEF COMPLAINT: Multiple myeloma CURRENT TREATMENT: Lenalidomide maintenance   HISTORY OF PRESENT ILLNESS: From the original intake nodes:  The patient was worked up for peptic ulcer disease in August of 2011, with significant bleeding and anemia.  The patient was Helicobacter pylori negative.  He received some epinephrine when he had his EGD and then started on Protonix.  [name] anemia slowly resolved so that by September, his hemoglobin was up to 10.9 and by earlier this month, his hemoglobin was up to 12.5.    As part of his general workup, he was found to have a slightly elevated globulin fraction.  In September, his total protein was 8.3 with an albumin of 3.8.  In January, the total protein was 8.4 and albumin 3.6.  With persistence of this slight abnormality, Dr. Olevia Perches obtained serum immunofixation and SPEP.  The SPTP showed an M-spike of 2.67 grams.  A total IgG was 4,190.  Total IgA low at 28.  Total IgM low at 28 also.  The immunofixation showed a monoclonal IgG lambda paraprotein.  There were also monoclonal free lambda light chains present. With this information, the patient was referred for further evaluation.   A diagnosis of myeloma was confirmed by bone marrow biopsy and subsequebnt treatment is as detailed below  INTERVAL HISTORY: Andre [name] returns today for followup of his multiple myeloma. He continues on lenalidomide, 10 mg daily, 21 days on, 7 days off. This is day 11 of his current cycle. He is tolerating the lenalidomide well. He denies any significant peripheral neuropathy. He is not constipated or sleepy.   REVIEW OF SYSTEMS: Andre [name] sometimes has blurred vision and feels a little unclear in his thinking. This tends to happen about once a week. It can happen right after  breakfast or you can happen at work sometimes. It is not occurring more frequently or 4 more prolonged periods of time than before. The main problem she has right now is a left maxillary wisdom tooth that is going to have to be removed this Friday, November 6. He is walking about 2 miles every other day but he is not swimming or jogging at this point. He is planning to get married. He has been dating [employer] for about 2 years now. He's got a second grandchild on the way.   PAST MEDICAL HISTORY: Past Medical History  Diagnosis Date  . Hypertension   . Hyperlipidemia   . Duodenal ulcer   . Multiple myeloma 07/04/2011  . Allergy   . Thyroid disease   . GERD (gastroesophageal reflux disease)   . Depression   . Anemia   Significant for peptic ulcer disease as noted above.  History of hyperlipidemia.  History of anxiety and depression.  History of GERD which is significantly improved with weight loss and history of reactive airway disease, possibly secondary to the GERD which also improved with weight loss.  There was a history of obesity, now much improved secondary to exercise and diet.   PAST SURGICAL HISTORY: Past Surgical History  Procedure Laterality Date  . Cardiolite study  11/25/2003    NORMAL  . Bone marrow transplant  2011    for MM  . Tonsillectomy      FAMILY HISTORY Family History  Problem Relation Age of Onset  . Throat cancer Mother   .  Hypertension Father   . Stroke Father   . Asthma Father   . Diabetes Father   . Pancreatic cancer Brother   [name] father died at the age of 57 following a stroke.  [name] mother died with cancer of the throat at the age of 66.   She had a history of depression and was a smoker.  Not clear how much alcohol she drank, according to the patient.  The patient had two brothers.  One died from the age of 29 from a "kidney problem."  The second one died at the age of 42 from pancreatic cancer. (This may have been duodenal  cancer. 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  His wife of 37+ years was [name], but they are now divorced.   The patient has two daughters:  [name] who is 33, and Groeneveld who is 58.  They both live here in [place]. 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  The patient is very close to his daughters.  Lacko has one daughter, [name], born 03/06/2013, apparently with [employer] The patient is not a church attender.  He does derive a great deal of support from friends at the [employer] which he attends very regularly, and also participates in Al-Anon even though actually there is no alcohol or drug problem in the family.  He participates in this group and he gets quite a bit of support from it.  He is considering marriage to "Carnmie" his significant other, January 2016    ADVANCED DIRECTIVES: In place  HEALTH MAINTENANCE: (updated 07/16/2013) History  Substance Use Topics  . Smoking status: Former Smoker -- 3 years    Quit date: 03/27/1969  . Smokeless tobacco: Never Used  . Alcohol Use: No     Colonoscopy:  PSA:  Sept 2014, normal/Dr. [name]  Lipid panel: Sept 2014/Dr. [name]   Allergies  Allergen Reactions  . Crestor [Rosuvastatin Calcium]     ADVERSE EFFECTS ON LIVER  . Lipitor [Atorvastatin Calcium]     Current Outpatient Prescriptions  Medication Sig Dispense Refill  . aspirin EC 81 MG tablet Take 81 mg by mouth every morning.    . Bisacodyl (DULCOLAX PO) Take 1 tablet by mouth.    . cetirizine (ZYRTEC) 10 MG tablet Take 10 mg by mouth daily.    . cholecalciferol (VITAMIN D) 1000 UNITS tablet take 1 tablet by mouth once daily 100 tablet 3  . escitalopram (LEXAPRO) 10 MG tablet take 1 tablet by mouth once daily 30 tablet 11  . ibuprofen (ADVIL,MOTRIN)  200 MG tablet Take 400 mg by mouth every 8 (eight) hours as needed for pain.     [name] lenalidomide (REVLIMID) 10 MG capsule Take one capsule daily for 21 days, then off for 7 days, then repeat 21 capsule 0  . levothyroxine (SYNTHROID, LEVOTHROID) 150 MCG tablet Take 1 tablet (150 mcg total) by mouth daily before breakfast. 90 tablet 3  . mupirocin ointment (BACTROBAN) 2 % Place small amount into each nostril twice daily x 5 days.  After application, press sides of nose together and gently massage. 22 g 0  . omeprazole (PRILOSEC) 40 MG capsule Take 1 capsule (40 mg total) by mouth every morning. 30 capsule 2  . polyethylene glycol (MIRALAX / GLYCOLAX) packet Take 17  g by mouth as needed.     . sildenafil (VIAGRA) 100 MG tablet Take 1 tablet (100 mg total) by mouth as needed for erectile dysfunction. 12 tablet 11  . [DISCONTINUED] fexofenadine (ALLEGRA) 60 MG tablet Take 180 mg by mouth daily.       No current facility-administered medications for this visit.    OBJECTIVE: Middle-aged white man in no acute distress Filed Vitals:   07/20/14 1400  BP: 113/69  Pulse: 61  Temp: 97.7 F (36.5 C)  Resp: 18   Body mass index is 26.07 kg/(m^2).  Sclerae unicteric, pupils equal and reactive Oropharynx clear and moist-- no thrushor other lesions No cervical or supraclavicular adenopathy Lungs no rales or rhonchi Heart regular rate and rhythm Abd soft, nontender, positive bowel sounds MSK no focal spinal tenderness, no upper extremity lymphedema Neuro: nonfocal, well oriented, appropriate affect   LAB RESULTS:  Lab Results  Component Value Date   WBC 3.3* 07/20/2014   NEUTROABS 1.5 07/20/2014   HGB 11.3* 07/20/2014   HCT 34.7* 07/20/2014   MCV 100.7* 07/20/2014   PLT 101* 07/20/2014      Chemistry      Component Value Date/Time   NA 138 07/07/2014 0803   NA 136 09/17/2013 0940   K 4.1 07/07/2014 0803   K 3.5 09/17/2013 0940   CL 103 09/17/2013 0940   CL 107 03/10/2013 1352   CO2  19* 07/07/2014 0803   CO2 26 09/17/2013 0940   BUN 20.7 07/07/2014 0803   BUN 19 09/17/2013 0940   CREATININE 1.2 07/07/2014 0803   CREATININE 1.2 09/17/2013 0940      Component Value Date/Time   CALCIUM 9.0 07/07/2014 0803   CALCIUM 8.7 09/17/2013 0940   ALKPHOS 76 07/07/2014 0803   ALKPHOS 62 06/16/2013 1547   AST 18 07/07/2014 0803   AST 26 06/16/2013 1547   ALT 16 07/07/2014 0803   ALT 23 06/16/2013 1547   BILITOT 0.37 07/07/2014 0803   BILITOT 0.5 06/16/2013 1547    Results for [name], BUA (MRN [id number]) as of 07/20/2014 14:02  Ref. Range 02/17/2014 08:05 03/18/2014 08:23 04/16/2014 08:11 05/19/2014 08:24 07/07/2014 08:03  Kappa free light chain Latest Range: 0.33-1.94 mg/dL 2.89 (H) 5.13 (H) 4.96 (H) 5.01 (H) 3.62 (H)  Lambda Free Lght Chn Latest Range: 0.57-2.63 mg/dL 4.89 (H) 6.86 (H) 6.86 (H) 6.83 (H) 6.87 (H)  Kappa:Lambda Ratio Latest Range: 0.26-1.65  0.59 0.75 0.72 0.73 0.53  Results for [facility] (MRN [id number]) as of 07/20/2014 14:02  Ref. Range 12/25/2013 08:47 01/22/2014 08:13 02/17/2014 08:05 03/18/2014 08:23 04/16/2014 08:11 05/19/2014 08:24 07/07/2014 08:03  M-SPIKE, % No range found 0.36 0.45 0.53 0.55 0.58 0.58 0.69   STUDIES: No results found.  ASSESSMENT: 63 y.o.  Novice man with a history of multiple myeloma diagnosed February of 2012, with an initial M-spike of 2.6 g/dL, IFE showing an IgG lambda paraprotein and free lambda light chains in the urine. Cytogenetics showed trisomy 74. Bone marrow biopsy showed a 22% plasmacytosis. Treated with   (1) bortezomib (subcutaneously), lenalidomide, and dexamethasone, with repeat bone marrow biopsy May of 2012 showing 10% plasmacytosis   (2) high-dose chemotherapy with BCNU and melphalan at [facility], followed by stem cell rescue July of 2012   (3) on zoledronic acid started December of 2012, initially monthly, currently given every 3 months, most recent dose 11/28/2013  (4) low-dose lenalidomide resumed April 2013,  interrupted several times since.  Resumed again on 02/19/2013, and a dose  of 5 mg daily, 21 days on and 7 days off.  (5) CNS symptoms and abnormal brain MRI extensively evaluated by neurology with no definitive diagnosis established; improving   (6) rising M spike noted June 2014 but did not meet criteria for carfilzomib study  (7) on lenalidomide 25 mg daily, 14 days on, 7 days off, starting 04/18/2013, interrupted December 2014 because of rash; resumed January 2015 at 10 mg/ day at 21 days on/ 7 days off   PLAN:  Andre [name] is tolerating the lenalidomide fine. His counts are holding up and he doesn't have any significant peripheral neuropathy.  I don't think antibiotic prophylaxis is absolutely mandatory prior to his wisdom tooth extraction, but it is true that he is somewhat immunocompromised because of the nature of his illness and because of the lenalidomide. If his dentist wanted to use antibiotics before the procedure I would have no problems with that.  I don't have an explanation as to Andre [name]'s brief episodes of dizziness and blurred vision. He has had abnormal MRIs of the brain in the past and he has been excised evaluated by neurology both here and at [facility]. [place] as this is not progressive or more continuous or intense I think observation alone is adequate.  We are going to continue lenalidomide until there is evidence of disease progression. At that time we would consider the carfilzomib study.  Chauncey Cruel, MD     07/20/2014

## 2014-07-20 NOTE — Telephone Encounter (Signed)
per pof to sch pt appt-gave pt copy of sch °

## 2014-07-21 ENCOUNTER — Other Ambulatory Visit: Payer: Commercial Managed Care - PPO

## 2014-07-28 ENCOUNTER — Other Ambulatory Visit: Payer: Commercial Managed Care - PPO

## 2014-08-04 ENCOUNTER — Telehealth: Payer: Self-pay | Admitting: Nurse Practitioner

## 2014-08-04 ENCOUNTER — Telehealth: Payer: Self-pay | Admitting: Oncology

## 2014-08-04 ENCOUNTER — Other Ambulatory Visit (HOSPITAL_BASED_OUTPATIENT_CLINIC_OR_DEPARTMENT_OTHER): Payer: Commercial Managed Care - PPO

## 2014-08-04 ENCOUNTER — Other Ambulatory Visit: Payer: Self-pay | Admitting: Nurse Practitioner

## 2014-08-04 DIAGNOSIS — C9000 Multiple myeloma not having achieved remission: Secondary | ICD-10-CM

## 2014-08-04 LAB — CBC WITH DIFFERENTIAL/PLATELET
BASO%: 0.7 % (ref 0.0–2.0)
Basophils Absolute: 0 10*3/uL (ref 0.0–0.1)
EOS%: 5.5 % (ref 0.0–7.0)
Eosinophils Absolute: 0.2 10*3/uL (ref 0.0–0.5)
HEMATOCRIT: 33.2 % — AB (ref 38.4–49.9)
HGB: 11.3 g/dL — ABNORMAL LOW (ref 13.0–17.1)
LYMPH%: 46.7 % (ref 14.0–49.0)
MCH: 33.2 pg (ref 27.2–33.4)
MCHC: 34 g/dL (ref 32.0–36.0)
MCV: 97.6 fL (ref 79.3–98.0)
MONO#: 0.3 10*3/uL (ref 0.1–0.9)
MONO%: 10.3 % (ref 0.0–14.0)
NEUT%: 36.8 % — ABNORMAL LOW (ref 39.0–75.0)
NEUTROS ABS: 1 10*3/uL — AB (ref 1.5–6.5)
PLATELETS: 74 10*3/uL — AB (ref 140–400)
RBC: 3.4 10*6/uL — AB (ref 4.20–5.82)
RDW: 15.6 % — ABNORMAL HIGH (ref 11.0–14.6)
WBC: 2.7 10*3/uL — ABNORMAL LOW (ref 4.0–10.3)
lymph#: 1.3 10*3/uL (ref 0.9–3.3)

## 2014-08-04 LAB — COMPREHENSIVE METABOLIC PANEL (CC13)
ALBUMIN: 3.3 g/dL — AB (ref 3.5–5.0)
ALT: 22 U/L (ref 0–55)
AST: 18 U/L (ref 5–34)
Alkaline Phosphatase: 72 U/L (ref 40–150)
Anion Gap: 8 mEq/L (ref 3–11)
BILIRUBIN TOTAL: 0.32 mg/dL (ref 0.20–1.20)
BUN: 17.5 mg/dL (ref 7.0–26.0)
CALCIUM: 9.8 mg/dL (ref 8.4–10.4)
CO2: 22 mEq/L (ref 22–29)
Chloride: 110 mEq/L — ABNORMAL HIGH (ref 98–109)
Creatinine: 1.1 mg/dL (ref 0.7–1.3)
GLUCOSE: 94 mg/dL (ref 70–140)
Potassium: 4.2 mEq/L (ref 3.5–5.1)
Sodium: 141 mEq/L (ref 136–145)
Total Protein: 6.8 g/dL (ref 6.4–8.3)

## 2014-08-04 LAB — PROTEIN / CREATININE RATIO, URINE
Creatinine, Urine: 87.5 mg/dL
Protein Creatinine Ratio: 0.16 — ABNORMAL HIGH (ref <0.15)
Total Protein, Urine: 14 mg/dL (ref 5–25)

## 2014-08-04 NOTE — Telephone Encounter (Signed)
s.w pt and advised on NOV appt....pt ok adn aware °

## 2014-08-04 NOTE — Telephone Encounter (Signed)
Reviewed lab work. [place] down to 1.0, scheduled to restart lenalidomide this Thursday. Consulted with Dr. [name]. He wishes to hold start of drug for 1 week and recheck CBC. Conveyed this message to Andre [name] around 1:20pm. Patient confirms understanding. Will hold med until given permission to restart next week. POF put in for lab draw next Tuesday. Will continue to monitor.

## 2014-08-05 ENCOUNTER — Encounter: Payer: Self-pay | Admitting: Nurse Practitioner

## 2014-08-06 LAB — PROTEIN ELECTROPHORESIS, SERUM
ALPHA-2-GLOBULIN: 10.7 % (ref 7.1–11.8)
Albumin ELP: 52.3 % — ABNORMAL LOW (ref 55.8–66.1)
Alpha-1-Globulin: 4.4 % (ref 2.9–4.9)
Beta 2: 5.4 % (ref 3.2–6.5)
Beta Globulin: 7 % (ref 4.7–7.2)
Gamma Globulin: 20.2 % — ABNORMAL HIGH (ref 11.1–18.8)
M-SPIKE, %: 0.68 g/dL
TOTAL PROTEIN, SERUM ELECTROPHOR: 6.6 g/dL (ref 6.0–8.3)

## 2014-08-06 LAB — KAPPA/LAMBDA LIGHT CHAINS
KAPPA FREE LGHT CHN: 2.15 mg/dL — AB (ref 0.33–1.94)
Kappa:Lambda Ratio: 0.4 (ref 0.26–1.65)
LAMBDA FREE LGHT CHN: 5.35 mg/dL — AB (ref 0.57–2.63)

## 2014-08-07 ENCOUNTER — Other Ambulatory Visit: Payer: Self-pay | Admitting: *Deleted

## 2014-08-07 NOTE — Telephone Encounter (Signed)
Refill request to MD desk for approval. 

## 2014-08-11 ENCOUNTER — Other Ambulatory Visit (HOSPITAL_BASED_OUTPATIENT_CLINIC_OR_DEPARTMENT_OTHER): Payer: Commercial Managed Care - PPO

## 2014-08-11 ENCOUNTER — Other Ambulatory Visit: Payer: Commercial Managed Care - PPO

## 2014-08-11 ENCOUNTER — Other Ambulatory Visit: Payer: Self-pay | Admitting: Emergency Medicine

## 2014-08-11 DIAGNOSIS — C9000 Multiple myeloma not having achieved remission: Secondary | ICD-10-CM

## 2014-08-11 LAB — COMPREHENSIVE METABOLIC PANEL (CC13)
ALBUMIN: 3.4 g/dL — AB (ref 3.5–5.0)
ALT: 11 U/L (ref 0–55)
AST: 16 U/L (ref 5–34)
Alkaline Phosphatase: 85 U/L (ref 40–150)
Anion Gap: 8 mEq/L (ref 3–11)
BUN: 21.2 mg/dL (ref 7.0–26.0)
CALCIUM: 9.2 mg/dL (ref 8.4–10.4)
CHLORIDE: 108 meq/L (ref 98–109)
CO2: 22 mEq/L (ref 22–29)
CREATININE: 1.1 mg/dL (ref 0.7–1.3)
GLUCOSE: 119 mg/dL (ref 70–140)
Potassium: 3.9 mEq/L (ref 3.5–5.1)
Sodium: 138 mEq/L (ref 136–145)
Total Bilirubin: 0.31 mg/dL (ref 0.20–1.20)
Total Protein: 7 g/dL (ref 6.4–8.3)

## 2014-08-11 LAB — CBC WITH DIFFERENTIAL/PLATELET
BASO%: 2.5 % — ABNORMAL HIGH (ref 0.0–2.0)
BASOS ABS: 0.1 10*3/uL (ref 0.0–0.1)
EOS ABS: 0.1 10*3/uL (ref 0.0–0.5)
EOS%: 4.4 % (ref 0.0–7.0)
HEMATOCRIT: 34.4 % — AB (ref 38.4–49.9)
HEMOGLOBIN: 11.3 g/dL — AB (ref 13.0–17.1)
LYMPH%: 42 % (ref 14.0–49.0)
MCH: 32.9 pg (ref 27.2–33.4)
MCHC: 32.9 g/dL (ref 32.0–36.0)
MCV: 100 fL — AB (ref 79.3–98.0)
MONO#: 0.3 10*3/uL (ref 0.1–0.9)
MONO%: 10.5 % (ref 0.0–14.0)
NEUT%: 40.6 % (ref 39.0–75.0)
NEUTROS ABS: 1 10*3/uL — AB (ref 1.5–6.5)
Platelets: 104 10*3/uL — ABNORMAL LOW (ref 140–400)
RBC: 3.44 10*6/uL — ABNORMAL LOW (ref 4.20–5.82)
RDW: 16.8 % — ABNORMAL HIGH (ref 11.0–14.6)
WBC: 2.5 10*3/uL — ABNORMAL LOW (ref 4.0–10.3)
lymph#: 1.1 10*3/uL (ref 0.9–3.3)

## 2014-08-12 ENCOUNTER — Other Ambulatory Visit: Payer: Self-pay | Admitting: *Deleted

## 2014-08-12 NOTE — Telephone Encounter (Signed)
THIS REFILL REQUEST FOR REVLIMID WAS GIVEN TO DR.MAGRINAT'S NURSE, VAL DODD,RN.

## 2014-08-17 ENCOUNTER — Other Ambulatory Visit: Payer: Self-pay | Admitting: *Deleted

## 2014-08-17 DIAGNOSIS — C9000 Multiple myeloma not having achieved remission: Secondary | ICD-10-CM

## 2014-08-17 MED ORDER — LENALIDOMIDE 10 MG PO CAPS: ORAL_CAPSULE | ORAL | Status: DC

## 2014-08-17 MED ORDER — OMEPRAZOLE 40 MG PO CPDR: 40.0000 mg | DELAYED_RELEASE_CAPSULE | Freq: Every morning | ORAL | Status: DC

## 2014-08-18 ENCOUNTER — Telehealth: Payer: Self-pay | Admitting: Nurse Practitioner

## 2014-08-18 ENCOUNTER — Other Ambulatory Visit (HOSPITAL_BASED_OUTPATIENT_CLINIC_OR_DEPARTMENT_OTHER): Payer: Commercial Managed Care - PPO

## 2014-08-18 ENCOUNTER — Other Ambulatory Visit: Payer: Self-pay | Admitting: Oncology

## 2014-08-18 DIAGNOSIS — C9000 Multiple myeloma not having achieved remission: Secondary | ICD-10-CM

## 2014-08-18 LAB — CBC WITH DIFFERENTIAL/PLATELET
BASO%: 1.1 % (ref 0.0–2.0)
Basophils Absolute: 0 10*3/uL (ref 0.0–0.1)
EOS%: 4.7 % (ref 0.0–7.0)
Eosinophils Absolute: 0.1 10*3/uL (ref 0.0–0.5)
HCT: 34.1 % — ABNORMAL LOW (ref 38.4–49.9)
HGB: 11.6 g/dL — ABNORMAL LOW (ref 13.0–17.1)
LYMPH#: 1 10*3/uL (ref 0.9–3.3)
LYMPH%: 35 % (ref 14.0–49.0)
MCH: 33.5 pg — ABNORMAL HIGH (ref 27.2–33.4)
MCHC: 34 g/dL (ref 32.0–36.0)
MCV: 98.6 fL — ABNORMAL HIGH (ref 79.3–98.0)
MONO#: 0.4 10*3/uL (ref 0.1–0.9)
MONO%: 14.4 % — ABNORMAL HIGH (ref 0.0–14.0)
NEUT%: 44.8 % (ref 39.0–75.0)
NEUTROS ABS: 1.2 10*3/uL — AB (ref 1.5–6.5)
PLATELETS: 86 10*3/uL — AB (ref 140–400)
RBC: 3.46 10*6/uL — AB (ref 4.20–5.82)
RDW: 15.6 % — ABNORMAL HIGH (ref 11.0–14.6)
WBC: 2.8 10*3/uL — ABNORMAL LOW (ref 4.0–10.3)

## 2014-08-18 LAB — COMPREHENSIVE METABOLIC PANEL (CC13)
ALBUMIN: 3.4 g/dL — AB (ref 3.5–5.0)
ALT: 23 U/L (ref 0–55)
ANION GAP: 8 meq/L (ref 3–11)
AST: 23 U/L (ref 5–34)
Alkaline Phosphatase: 84 U/L (ref 40–150)
BILIRUBIN TOTAL: 0.31 mg/dL (ref 0.20–1.20)
BUN: 17.7 mg/dL (ref 7.0–26.0)
CO2: 22 meq/L (ref 22–29)
Calcium: 9.5 mg/dL (ref 8.4–10.4)
Chloride: 107 mEq/L (ref 98–109)
Creatinine: 1.1 mg/dL (ref 0.7–1.3)
Glucose: 103 mg/dl (ref 70–140)
Potassium: 4.3 mEq/L (ref 3.5–5.1)
SODIUM: 137 meq/L (ref 136–145)
TOTAL PROTEIN: 6.9 g/dL (ref 6.4–8.3)

## 2014-08-18 NOTE — Telephone Encounter (Signed)
per pof to sch pt appt-cld & spoke to pt to give time & date-pt understood

## 2014-08-18 NOTE — Telephone Encounter (Signed)
Reviewed lab work. ANC up to 1.2. Consulted with Dr. [name]. He suggested [name] restart the lenalidomide, but with 14 days on/7 days off instead of 21 days on/7 days off. Patient confirms understanding. Patient to return to clinic this Friday for an office visit with Dr. [name]. Will continue weekly labs.

## 2014-08-20 ENCOUNTER — Telehealth: Payer: Self-pay | Admitting: *Deleted

## 2014-08-20 ENCOUNTER — Other Ambulatory Visit: Payer: Self-pay | Admitting: Oncology

## 2014-08-20 DIAGNOSIS — C9000 Multiple myeloma not having achieved remission: Secondary | ICD-10-CM

## 2014-08-20 NOTE — Telephone Encounter (Signed)
Called patient to take Revlimid survey required for current refill order to be filled.  Patient states "this is too early.  I need to call them because this is costing too much money and I will count Revlimid pills I have at home.  [employer] number given with instructions to follow option to talk to a live person.  He says the sig "was changed 07-20-2014 to take 14 days on, 7 days off and repeat and heather is aware of this change".  Asked that he let Triage know the results of his inquiry and he can refuse shipment.

## 2014-08-21 ENCOUNTER — Telehealth: Payer: Self-pay | Admitting: *Deleted

## 2014-08-21 ENCOUNTER — Telehealth: Payer: Self-pay | Admitting: Oncology

## 2014-08-21 ENCOUNTER — Other Ambulatory Visit: Payer: Self-pay | Admitting: Nurse Practitioner

## 2014-08-21 ENCOUNTER — Ambulatory Visit (HOSPITAL_BASED_OUTPATIENT_CLINIC_OR_DEPARTMENT_OTHER): Payer: Commercial Managed Care - PPO | Admitting: Oncology

## 2014-08-21 VITALS — BP 130/72 | HR 66 | Temp 97.5°F | Resp 18 | Ht 70.0 in | Wt 182.5 lb

## 2014-08-21 DIAGNOSIS — C9000 Multiple myeloma not having achieved remission: Secondary | ICD-10-CM

## 2014-08-21 DIAGNOSIS — K5900 Constipation, unspecified: Secondary | ICD-10-CM

## 2014-08-21 DIAGNOSIS — R9089 Other abnormal findings on diagnostic imaging of central nervous system: Secondary | ICD-10-CM

## 2014-08-21 MED ORDER — LENALIDOMIDE 10 MG PO CAPS: 10.0000 mg | ORAL_CAPSULE | Freq: Every day | ORAL | Status: DC

## 2014-08-21 NOTE — Telephone Encounter (Signed)
Per staff message and POF I have scheduled appts. Advised scheduler of appts. JMW  

## 2014-08-21 NOTE — Telephone Encounter (Signed)
pe pof to sch pt appt-MW sch trmts-will mail copy of sch-mailed

## 2014-08-21 NOTE — Telephone Encounter (Signed)
per pof to sch pt appt-sent MW email to sch pt trmt-will mail copy o fsch once reply

## 2014-08-21 NOTE — Progress Notes (Signed)
ID: Andre [name]   DOB: 22-Jul-1951  MR#: [id number]  CSN#:[id number]   PCP: [name] SU: OTHER MD: [name], [name], Dian Situ Jorgeluis, Gurganus Gollehon [phone])  CHIEF COMPLAINT: Multiple myeloma CURRENT TREATMENT: Lenalidomide maintenance   HISTORY OF PRESENT ILLNESS: From the original intake nodes:  The patient was worked up for peptic ulcer disease in August of 2011, with significant bleeding and anemia.  The patient was Helicobacter pylori negative.  He received some epinephrine when he had his EGD and then started on Protonix.  [name] anemia slowly resolved so that by September, his hemoglobin was up to 10.9 and by earlier this month, his hemoglobin was up to 12.5.    As part of his general workup, he was found to have a slightly elevated globulin fraction.  In September, his total protein was 8.3 with an albumin of 3.8.  In January, the total protein was 8.4 and albumin 3.6.  With persistence of this slight abnormality, Dr. Olevia Perches obtained serum immunofixation and SPEP.  The SPTP showed an M-spike of 2.67 grams.  A total IgG was 4,190.  Total IgA low at 28.  Total IgM low at 28 also.  The immunofixation showed a monoclonal IgG lambda paraprotein.  There were also monoclonal free lambda light chains present. With this information, the patient was referred for further evaluation.   A diagnosis of myeloma was confirmed by bone marrow biopsy and subsequebnt treatment is as detailed below  INTERVAL HISTORY: Andre [name] returns today for followup of his multiple myeloma accompanied by his fiancee [name].[name] abdominal tenderness the last visit here we dropped his lenalidomide dose from 10 mg/d 21/7 to same daily dose 14/7. We will be following his protein counts closely to make sure there is no significant increase, in which case we would go back to the prior schedule. The problem of course is his bordeline counts,  Which however have not resulted in  intercurrentinfection or transfusion needs.  REVIEW OF SYSTEMS: Andre [name] still has a balance problem though there have been no recent falls. He feels a little dizzy some mornings. There has been no nausea or vomiting, no visual changes, and no focal weakness. He denies neuropahy symptoms. He remains constipatied, the takes too many softeners etc and develops diarrhea. This is a chronic complaint. He is walking about a mile a day, which would be great in most patients but is a significant decline from prior in his case. A detailed review of systems today was otherwise negative.   PAST MEDICAL HISTORY: Past Medical History  Diagnosis Date  . Hypertension   . Hyperlipidemia   . Duodenal ulcer   . Multiple myeloma 07/04/2011  . Allergy   . Thyroid disease   . GERD (gastroesophageal reflux disease)   . Depression   . Anemia   Significant for peptic ulcer disease as noted above.  History of hyperlipidemia.  History of anxiety and depression.  History of GERD which is significantly improved with weight loss and history of reactive airway disease, possibly secondary to the GERD which also improved with weight loss.  There was a history of obesity, now much improved secondary to exercise and diet.   PAST SURGICAL HISTORY: Past Surgical History  Procedure Laterality Date  . Cardiolite study  11/25/2003    NORMAL  . Bone marrow transplant  2011    for MM  . Tonsillectomy      FAMILY HISTORY Family History  Problem Relation Age of Onset  . Throat  cancer Mother   . Hypertension Father   . Stroke Father   . Asthma Father   . Diabetes Father   . Pancreatic cancer Brother   [name] father died at the age of 72 following a stroke.  [name] mother died with cancer of the throat at the age of 51.   She had a history of depression and was a smoker.  Not clear how much alcohol she drank, according to the patient.  The patient had two brothers.  One died from the age of 11 from a "kidney  problem."  The second one died at the age of 25 from pancreatic cancer. (This may have been duodenal cancer. 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  His wife of 37+ years was [name], but they are now divorced.   The patient has two daughters:  [name] who is 65, and [name] who is 24.  They both live here in [place]. 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  The patient is very close to his daughters.  [place] has one daughter, Andre [name], born 03/06/2013, apparently with [employer]/Rendu The patient is not a church attender.  He does derive a great deal of support from friends at the "[employer] which he attends very regularly, and also participates in Al-Anon even though actually there is no alcohol or drug problem in the family.  He participates in this group and he gets quite a bit of support from it.  He is considering marriage to "Carnmie" his significant other, January 2016    ADVANCED DIRECTIVES: In place  HEALTH MAINTENANCE: (updated 07/16/2013) History  Substance Use Topics  . Smoking status: Former Smoker -- 3 years    Quit date: 03/27/1969  . Smokeless tobacco: Never Used  . Alcohol Use: No     Colonoscopy:  PSA:  Sept 2014, normal/Dr. [name]  Lipid panel: Sept 2014/Dr. [name]   Allergies  Allergen Reactions  . Crestor [Rosuvastatin Calcium]     ADVERSE EFFECTS ON LIVER  . Lipitor [Atorvastatin Calcium]     Current Outpatient Prescriptions  Medication Sig Dispense Refill  . aspirin EC 81 MG tablet Take 81 mg by mouth every morning.    . Bisacodyl (DULCOLAX PO) Take 1 tablet by mouth.    . cetirizine (ZYRTEC) 10 MG tablet Take 10 mg by mouth daily.    . cholecalciferol (VITAMIN D) 1000 UNITS tablet take 1 tablet by mouth once daily 100 tablet 3  . escitalopram  (LEXAPRO) 10 MG tablet take 1 tablet by mouth once daily 30 tablet 11  . ibuprofen (ADVIL,MOTRIN) 200 MG tablet Take 400 mg by mouth every 8 (eight) hours as needed for pain.     [name] lenalidomide (REVLIMID) 10 MG capsule Take 1 capsule (10 mg total) by mouth daily. 14 days on, 7 days off, then repeat 14 capsule 1  . levothyroxine (SYNTHROID, LEVOTHROID) 150 MCG tablet Take 1 tablet (150 mcg total) by mouth daily before breakfast. 90 tablet 3  . mupirocin ointment (BACTROBAN) 2 % Place small amount into each nostril twice daily x 5 days.  After application, press sides of nose together and gently massage. 22 g 0  . omeprazole (PRILOSEC) 40 MG capsule Take 1 capsule (40 mg total) by mouth every morning. 30 capsule 2  .  polyethylene glycol (MIRALAX / GLYCOLAX) packet Take 17 g by mouth as needed.     . sildenafil (VIAGRA) 100 MG tablet Take 1 tablet (100 mg total) by mouth as needed for erectile dysfunction. 12 tablet 11  . [DISCONTINUED] fexofenadine (ALLEGRA) 60 MG tablet Take 180 mg by mouth daily.       No current facility-administered medications for this visit.    OBJECTIVE: Middle-aged white man who appears stated age 63 Vitals:   08/21/14 1333  BP: 130/72  Pulse: 66  Temp: 97.5 F (36.4 C)  Resp: 18   Body mass index is 26.19 kg/(m^2).  Sclerae unicteric, pupils round, equal and reactive Oropharynx clear and moist-- no thrushor other lesions No cervical or supraclavicular adenopathy Lungs no rales or rhonchi Heart regular rate and rhythm Abd soft, nontender, positive bowel sounds MSK no focal spinal tenderness, no lymphedema Neuro: nonfocal, well oriented, appropriate affect   LAB RESULTS:  Lab Results  Component Value Date   WBC 2.8* 08/18/2014   NEUTROABS 1.2* 08/18/2014   HGB 11.6* 08/18/2014   HCT 34.1* 08/18/2014   MCV 98.6* 08/18/2014   PLT 86* 08/18/2014      Chemistry      Component Value Date/Time   NA 137 08/18/2014 0827   NA 136 09/17/2013 0940   K  4.3 08/18/2014 0827   K 3.5 09/17/2013 0940   CL 103 09/17/2013 0940   CL 107 03/10/2013 1352   CO2 22 08/18/2014 0827   CO2 26 09/17/2013 0940   BUN 17.7 08/18/2014 0827   BUN 19 09/17/2013 0940   CREATININE 1.1 08/18/2014 0827   CREATININE 1.2 09/17/2013 0940      Component Value Date/Time   CALCIUM 9.5 08/18/2014 0827   CALCIUM 8.7 09/17/2013 0940   ALKPHOS 84 08/18/2014 0827   ALKPHOS 62 06/16/2013 1547   AST 23 08/18/2014 0827   AST 26 06/16/2013 1547   ALT 23 08/18/2014 0827   ALT 23 06/16/2013 1547   BILITOT 0.31 08/18/2014 0827   BILITOT 0.5 06/16/2013 1547    .Results for TYJAY, GALINDO (MRN [id number]) as of 08/21/2014 13:27  Ref. Range 03/18/2014 08:23 04/16/2014 08:11 05/19/2014 08:24 07/07/2014 08:03 08/04/2014 08:01  M-SPIKE, % No range found 0.55 0.58 0.58 0.69 0.68  Results for [facility] (MRN [id number]) as of 08/21/2014 13:27  Ref. Range 03/18/2014 08:23 04/16/2014 08:11 05/19/2014 08:24 07/07/2014 08:03 08/04/2014 08:01  Lambda Free Lght Chn Latest Range: 0.57-2.63 mg/dL 6.86 (H) 6.86 (H) 6.83 (H) 6.87 (H) 5.35 (H)  Kappa:Lambda Ratio Latest Range: 0.26-1.65  0.75 0.72 0.73 0.53 0.40   .  STUDIES: No results found.  ASSESSMENT: 63 y.o.  [place] man with a history of multiple myeloma diagnosed February of 2012, with an initial M-spike of 2.6 g/dL, IFE showing an IgG lambda paraprotein and free lambda light chains in the urine. Cytogenetics showed trisomy 48. Bone marrow biopsy showed a 22% plasmacytosis. Treated with   (1) bortezomib (subcutaneously), lenalidomide, and dexamethasone, with repeat bone marrow biopsy May of 2012 showing 10% plasmacytosis   (2) high-dose chemotherapy with BCNU and melphalan at [facility], followed by stem cell rescue July of 2012   (3) on zoledronic acid started December of 2012, initially monthly, currently given every 3 months, most recent dose 02/17/2014  (4) low-dose lenalidomide resumed April 2013, interrupted several times  since.  Resumed again on 02/19/2013, and a dose of 5 mg daily, 21 days on and 7 days off.  (5) CNS symptoms and  abnormal brain MRI extensively evaluated by neurology with no definitive diagnosis established; improving   (6) rising M spike noted June 2014 but did not meet criteria for carfilzomib study  (7) on lenalidomide 25 mg daily, 14 days on, 7 days off, starting 04/18/2013, interrupted December 2014 because of rash;   (a) resumed January 2015 at 10 mg/ day at 21 days on/ 7 days off  (Jostin) starting 08/18/2014 decreasedto 10 mg/ day 14 days on, 7 days off because of cytopenias   PLAN:  Andre [name] is very stable clnically. He is dropping off on his exercise routine-- we discussed that. He isging to be married early next year.   I suggested repeating a brain MRI at this point since he apparently no longer has neurologic follow-up at [facility]. He demurred, however. He is overdue for zolendronate, which he will receive DEC 15   We again discussed constipation-- this is an area he can resolve if he focuses on it; I tried to enroll [name] on the project. At any rate he knows to take 1 to 4 stool softerners a day and use Miralax as needed.  I am hopeful the slightly less intense lenalidomide schedule will allow his neutrophils to come up some. If however we note a rise in his protein numbers we will go back to the 21/7 schedule. If that does not hold it of course we would consider the carfilzomib study or other suggestions depending on Dr Kendell Bane opinion--he will be seeing her in [place]   Accordingly he will see me again in March. We will continue to follow the counts Q 2 weeks through December, then monthly as before. Andre [name] has a good understanding of the overall plan. He agrees with it. He knows the goal of treatment in his case is control. He will call with any problems that may develop beore his next visit here. [name]  Chauncey Cruel, MD     08/21/2014

## 2014-08-25 ENCOUNTER — Other Ambulatory Visit: Payer: Commercial Managed Care - PPO

## 2014-09-01 ENCOUNTER — Other Ambulatory Visit (HOSPITAL_BASED_OUTPATIENT_CLINIC_OR_DEPARTMENT_OTHER): Payer: Commercial Managed Care - PPO

## 2014-09-01 ENCOUNTER — Ambulatory Visit (HOSPITAL_BASED_OUTPATIENT_CLINIC_OR_DEPARTMENT_OTHER): Payer: Commercial Managed Care - PPO

## 2014-09-01 DIAGNOSIS — C9000 Multiple myeloma not having achieved remission: Secondary | ICD-10-CM

## 2014-09-01 LAB — CBC WITH DIFFERENTIAL/PLATELET
BASO%: 0.3 % (ref 0.0–2.0)
Basophils Absolute: 0 10*3/uL (ref 0.0–0.1)
EOS%: 6.4 % (ref 0.0–7.0)
Eosinophils Absolute: 0.2 10*3/uL (ref 0.0–0.5)
HEMATOCRIT: 32.4 % — AB (ref 38.4–49.9)
HGB: 10.7 g/dL — ABNORMAL LOW (ref 13.0–17.1)
LYMPH%: 28.5 % (ref 14.0–49.0)
MCH: 33.5 pg — AB (ref 27.2–33.4)
MCHC: 33.1 g/dL (ref 32.0–36.0)
MCV: 101.1 fL — ABNORMAL HIGH (ref 79.3–98.0)
MONO#: 0.5 10*3/uL (ref 0.1–0.9)
MONO%: 15.2 % — ABNORMAL HIGH (ref 0.0–14.0)
NEUT#: 1.5 10*3/uL (ref 1.5–6.5)
NEUT%: 49.6 % (ref 39.0–75.0)
Platelets: 84 10*3/uL — ABNORMAL LOW (ref 140–400)
RBC: 3.2 10*6/uL — ABNORMAL LOW (ref 4.20–5.82)
RDW: 16.8 % — ABNORMAL HIGH (ref 11.0–14.6)
WBC: 3 10*3/uL — AB (ref 4.0–10.3)
lymph#: 0.9 10*3/uL (ref 0.9–3.3)

## 2014-09-01 LAB — COMPREHENSIVE METABOLIC PANEL (CC13)
ALT: 20 U/L (ref 0–55)
AST: 19 U/L (ref 5–34)
Albumin: 3.1 g/dL — ABNORMAL LOW (ref 3.5–5.0)
Alkaline Phosphatase: 73 U/L (ref 40–150)
Anion Gap: 10 mEq/L (ref 3–11)
BUN: 14 mg/dL (ref 7.0–26.0)
CHLORIDE: 108 meq/L (ref 98–109)
CO2: 21 mEq/L — ABNORMAL LOW (ref 22–29)
CREATININE: 1 mg/dL (ref 0.7–1.3)
Calcium: 9 mg/dL (ref 8.4–10.4)
EGFR: 76 mL/min/{1.73_m2} — ABNORMAL LOW (ref >90)
Glucose: 103 mg/dl (ref 70–140)
Potassium: 3.8 mEq/L (ref 3.5–5.1)
SODIUM: 139 meq/L (ref 136–145)
TOTAL PROTEIN: 6.3 g/dL — AB (ref 6.4–8.3)
Total Bilirubin: 0.38 mg/dL (ref 0.20–1.20)

## 2014-09-01 LAB — PROTEIN / CREATININE RATIO, URINE
Creatinine, Urine: 127.5 mg/dL
PROTEIN CREATININE RATIO: 0.23 — AB (ref <0.15)
Total Protein, Urine: 29 mg/dL — ABNORMAL HIGH (ref 5–25)

## 2014-09-01 MED ORDER — ZOLEDRONIC ACID 4 MG/100ML IV SOLN
4.0000 mg | Freq: Once | INTRAVENOUS | Status: AC
  Administered 2014-09-01: 4 mg via INTRAVENOUS
  Filled 2014-09-01: qty 100

## 2014-09-01 NOTE — Patient Instructions (Signed)

## 2014-09-03 ENCOUNTER — Other Ambulatory Visit: Payer: Self-pay | Admitting: Nurse Practitioner

## 2014-09-08 ENCOUNTER — Other Ambulatory Visit: Payer: Commercial Managed Care - PPO

## 2014-09-10 ENCOUNTER — Encounter: Payer: Self-pay | Admitting: Nurse Practitioner

## 2014-09-15 ENCOUNTER — Other Ambulatory Visit (HOSPITAL_BASED_OUTPATIENT_CLINIC_OR_DEPARTMENT_OTHER): Payer: Commercial Managed Care - PPO

## 2014-09-15 DIAGNOSIS — C9000 Multiple myeloma not having achieved remission: Secondary | ICD-10-CM

## 2014-09-15 LAB — COMPREHENSIVE METABOLIC PANEL (CC13)
ALT: 16 U/L (ref 0–55)
AST: 20 U/L (ref 5–34)
Albumin: 3.4 g/dL — ABNORMAL LOW (ref 3.5–5.0)
Alkaline Phosphatase: 81 U/L (ref 40–150)
Anion Gap: 8 mEq/L (ref 3–11)
BUN: 15.3 mg/dL (ref 7.0–26.0)
CALCIUM: 8.8 mg/dL (ref 8.4–10.4)
CHLORIDE: 108 meq/L (ref 98–109)
CO2: 21 mEq/L — ABNORMAL LOW (ref 22–29)
CREATININE: 1.1 mg/dL (ref 0.7–1.3)
EGFR: 71 mL/min/{1.73_m2} — AB (ref >90)
Glucose: 87 mg/dl (ref 70–140)
Potassium: 4.4 mEq/L (ref 3.5–5.1)
Sodium: 137 mEq/L (ref 136–145)
Total Bilirubin: 0.42 mg/dL (ref 0.20–1.20)
Total Protein: 7 g/dL (ref 6.4–8.3)

## 2014-09-15 LAB — CBC WITH DIFFERENTIAL/PLATELET
BASO%: 0.6 % (ref 0.0–2.0)
Basophils Absolute: 0 10*3/uL (ref 0.0–0.1)
EOS%: 3.8 % (ref 0.0–7.0)
Eosinophils Absolute: 0.1 10*3/uL (ref 0.0–0.5)
HEMATOCRIT: 33.9 % — AB (ref 38.4–49.9)
HGB: 11.2 g/dL — ABNORMAL LOW (ref 13.0–17.1)
LYMPH#: 0.8 10*3/uL — AB (ref 0.9–3.3)
LYMPH%: 21.8 % (ref 14.0–49.0)
MCH: 33.1 pg (ref 27.2–33.4)
MCHC: 32.9 g/dL (ref 32.0–36.0)
MCV: 100.6 fL — ABNORMAL HIGH (ref 79.3–98.0)
MONO#: 0.2 10*3/uL (ref 0.1–0.9)
MONO%: 6.6 % (ref 0.0–14.0)
NEUT#: 2.4 10*3/uL (ref 1.5–6.5)
NEUT%: 67.2 % (ref 39.0–75.0)
Platelets: 94 10*3/uL — ABNORMAL LOW (ref 140–400)
RBC: 3.37 10*6/uL — ABNORMAL LOW (ref 4.20–5.82)
RDW: 17 % — AB (ref 11.0–14.6)
WBC: 3.6 10*3/uL — ABNORMAL LOW (ref 4.0–10.3)

## 2014-09-16 ENCOUNTER — Other Ambulatory Visit (INDEPENDENT_AMBULATORY_CARE_PROVIDER_SITE_OTHER): Payer: Commercial Managed Care - PPO

## 2014-09-16 ENCOUNTER — Other Ambulatory Visit: Payer: Self-pay | Admitting: *Deleted

## 2014-09-16 ENCOUNTER — Telehealth: Payer: Self-pay | Admitting: *Deleted

## 2014-09-16 DIAGNOSIS — Z0000 Encounter for general adult medical examination without abnormal findings: Secondary | ICD-10-CM

## 2014-09-16 DIAGNOSIS — C9000 Multiple myeloma not having achieved remission: Secondary | ICD-10-CM

## 2014-09-16 LAB — URINALYSIS, ROUTINE W REFLEX MICROSCOPIC
BILIRUBIN URINE: NEGATIVE
KETONES UR: NEGATIVE
Leukocytes, UA: NEGATIVE
Nitrite: NEGATIVE
PH: 7 (ref 5.0–8.0)
SPECIFIC GRAVITY, URINE: 1.01 (ref 1.000–1.030)
TOTAL PROTEIN, URINE-UPE24: NEGATIVE
Urine Glucose: NEGATIVE
Urobilinogen, UA: 0.2 (ref 0.0–1.0)

## 2014-09-16 LAB — LIPID PANEL
CHOL/HDL RATIO: 4
Cholesterol: 165 mg/dL (ref 0–200)
HDL: 37.6 mg/dL — ABNORMAL LOW (ref >39.00)
LDL CALC: 106 mg/dL — AB (ref 0–99)
NonHDL: 127.4
TRIGLYCERIDES: 107 mg/dL (ref 0.0–149.0)
VLDL: 21.4 mg/dL (ref 0.0–40.0)

## 2014-09-16 LAB — BASIC METABOLIC PANEL
BUN: 20 mg/dL (ref 6–23)
CO2: 24 mEq/L (ref 19–32)
Calcium: 9 mg/dL (ref 8.4–10.5)
Chloride: 108 mEq/L (ref 96–112)
Creatinine, Ser: 1.4 mg/dL (ref 0.4–1.5)
GFR: 54.46 mL/min — ABNORMAL LOW (ref >60.00)
GLUCOSE: 89 mg/dL (ref 70–99)
POTASSIUM: 5.3 meq/L — AB (ref 3.5–5.1)
SODIUM: 139 meq/L (ref 135–145)

## 2014-09-16 LAB — HEPATIC FUNCTION PANEL
ALK PHOS: 75 U/L (ref 39–117)
ALT: 16 U/L (ref 0–53)
AST: 21 U/L (ref 0–37)
Albumin: 3.8 g/dL (ref 3.5–5.2)
BILIRUBIN DIRECT: 0.1 mg/dL (ref 0.0–0.3)
Total Bilirubin: 0.6 mg/dL (ref 0.2–1.2)
Total Protein: 7 g/dL (ref 6.0–8.3)

## 2014-09-16 LAB — TSH: TSH: 0.12 u[IU]/mL — ABNORMAL LOW (ref 0.35–4.50)

## 2014-09-16 LAB — PSA: PSA: 1.12 ng/mL (ref 0.10–4.00)

## 2014-09-16 MED ORDER — LENALIDOMIDE 10 MG PO CAPS: 10.0000 mg | ORAL_CAPSULE | Freq: Every day | ORAL | Status: DC

## 2014-09-16 NOTE — Telephone Encounter (Signed)
Called pt at home to remind pt to take survey with [facility] for Revlimid refill.  Pt stated he has had diarrhea x 3 today.  Pt is still on Revlimid cycle until Sep 21, 2014. Instructed pt to take Imodium now to help with diarrhea symptom.  Reinforced increased po fluids.  Instructed pt to call Dr. [name] nurse early in the am for update on diarrhea.  Message to Dr. [name] and desk nurse Val to review.

## 2014-09-16 NOTE — Telephone Encounter (Signed)
Spoke with Andre [name] and was informed that Andre [name] had taken 2 Imodium capsules, and drinking Gatorade at present.  Informed Andre [name] that per Dr. [name], Andre [name] can come in early am 09/17/14 to be evaluated by [name], NP symptom management.  Andre [name] understood and agreed with the plan.

## 2014-09-17 ENCOUNTER — Other Ambulatory Visit (HOSPITAL_BASED_OUTPATIENT_CLINIC_OR_DEPARTMENT_OTHER): Payer: Commercial Managed Care - PPO

## 2014-09-17 ENCOUNTER — Ambulatory Visit (HOSPITAL_BASED_OUTPATIENT_CLINIC_OR_DEPARTMENT_OTHER): Payer: Commercial Managed Care - PPO | Admitting: Nurse Practitioner

## 2014-09-17 ENCOUNTER — Encounter: Payer: Self-pay | Admitting: Nurse Practitioner

## 2014-09-17 VITALS — BP 108/57 | HR 71 | Temp 97.8°F | Resp 18 | Ht 70.0 in | Wt 182.2 lb

## 2014-09-17 DIAGNOSIS — C9000 Multiple myeloma not having achieved remission: Secondary | ICD-10-CM

## 2014-09-17 DIAGNOSIS — E559 Vitamin D deficiency, unspecified: Secondary | ICD-10-CM

## 2014-09-17 DIAGNOSIS — R197 Diarrhea, unspecified: Secondary | ICD-10-CM | POA: Insufficient documentation

## 2014-09-17 LAB — COMPREHENSIVE METABOLIC PANEL (CC13)
ALK PHOS: 84 U/L (ref 40–150)
ALT: 16 U/L (ref 0–55)
AST: 18 U/L (ref 5–34)
Albumin: 3.4 g/dL — ABNORMAL LOW (ref 3.5–5.0)
Anion Gap: 9 mEq/L (ref 3–11)
BILIRUBIN TOTAL: 0.59 mg/dL (ref 0.20–1.20)
BUN: 12.2 mg/dL (ref 7.0–26.0)
CO2: 21 mEq/L — ABNORMAL LOW (ref 22–29)
Calcium: 8.8 mg/dL (ref 8.4–10.4)
Chloride: 109 mEq/L (ref 98–109)
Creatinine: 1.2 mg/dL (ref 0.7–1.3)
EGFR: 61 mL/min/{1.73_m2} — ABNORMAL LOW (ref >90)
Glucose: 92 mg/dl (ref 70–140)
POTASSIUM: 4.1 meq/L (ref 3.5–5.1)
SODIUM: 139 meq/L (ref 136–145)
Total Protein: 6.9 g/dL (ref 6.4–8.3)

## 2014-09-17 LAB — CBC WITH DIFFERENTIAL/PLATELET
BASO%: 0.4 % (ref 0.0–2.0)
Basophils Absolute: 0 10*3/uL (ref 0.0–0.1)
EOS%: 5.8 % (ref 0.0–7.0)
Eosinophils Absolute: 0.2 10*3/uL (ref 0.0–0.5)
HCT: 35.6 % — ABNORMAL LOW (ref 38.4–49.9)
HEMOGLOBIN: 12.1 g/dL — AB (ref 13.0–17.1)
LYMPH#: 0.9 10*3/uL (ref 0.9–3.3)
LYMPH%: 33.2 % (ref 14.0–49.0)
MCH: 33.7 pg — AB (ref 27.2–33.4)
MCHC: 34 g/dL (ref 32.0–36.0)
MCV: 99.2 fL — ABNORMAL HIGH (ref 79.3–98.0)
MONO#: 0.2 10*3/uL (ref 0.1–0.9)
MONO%: 7.3 % (ref 0.0–14.0)
NEUT#: 1.4 10*3/uL — ABNORMAL LOW (ref 1.5–6.5)
NEUT%: 53.3 % (ref 39.0–75.0)
Platelets: 84 10*3/uL — ABNORMAL LOW (ref 140–400)
RBC: 3.59 10*6/uL — AB (ref 4.20–5.82)
RDW: 16.4 % — AB (ref 11.0–14.6)
WBC: 2.6 10*3/uL — ABNORMAL LOW (ref 4.0–10.3)

## 2014-09-17 LAB — PROTEIN ELECTROPHORESIS, SERUM
ALPHA-1-GLOBULIN: 4.6 % (ref 2.9–4.9)
Albumin ELP: 52.5 % — ABNORMAL LOW (ref 55.8–66.1)
Alpha-2-Globulin: 10.7 % (ref 7.1–11.8)
BETA 2: 5 % (ref 3.2–6.5)
Beta Globulin: 6.9 % (ref 4.7–7.2)
GAMMA GLOBULIN: 20.3 % — AB (ref 11.1–18.8)
M-Spike, %: 0.67 g/dL
Total Protein, Serum Electrophoresis: 6.8 g/dL (ref 6.0–8.3)

## 2014-09-17 LAB — KAPPA/LAMBDA LIGHT CHAINS
KAPPA FREE LGHT CHN: 5.22 mg/dL — AB (ref 0.33–1.94)
Kappa:Lambda Ratio: 0.66 (ref 0.26–1.65)
LAMBDA FREE LGHT CHN: 7.86 mg/dL — AB (ref 0.57–2.63)

## 2014-09-17 MED ORDER — ONDANSETRON HCL 8 MG PO TABS
8.0000 mg | ORAL_TABLET | Freq: Three times a day (TID) | ORAL | Status: DC | PRN
Start: 2014-09-17 — End: 2014-12-01

## 2014-09-17 NOTE — Progress Notes (Signed)
will   SYMPTOM MANAGEMENT CLINIC   HPI: Andre [name] 63 y.o. male diagnosed with multiple myeloma.  Currently undergoing Revlimid oral therapy.  Patient called the cancer Center yesterday requesting urgent care visit.  He states that he developed diarrhea yesterday; and felt slightly dehydrated.  He states that he did initiate Imodium therapy yesterday as well; and now all diarrhea has cleared.  He was able to drink a little coffee and Gatorade; and eat some toast earlier this morning with no diarrhea episodes.  Patient has a history of chronic constipation alternating with diarrhea after taking stool softeners.  Patient states that he does intermittently feel slightly nauseous; but has had no vomiting.  He did request a refill of his Zofran today.  Patient denies any recent fevers or chills.  Patient states that he did just return from a primary care visit and yearly physical yesterday as well.  He is questioning possibility of reviewing those lab results as well.   HPI  ROS  Past Medical History  Diagnosis Date  . Hypertension   . Hyperlipidemia   . Duodenal ulcer   . Multiple myeloma 07/04/2011  . Allergy   . Thyroid disease   . GERD (gastroesophageal reflux disease)   . Depression   . Anemia     Past Surgical History  Procedure Laterality Date  . Cardiolite study  11/25/2003    NORMAL  . Bone marrow transplant  2011    for MM  . Tonsillectomy      has HYPERLIPIDEMIA; ANEMIA; ASTHMA, UNSPECIFIED; SCHATZKI'S RING; PEPTIC ULCER DISEASE; HIATAL HERNIA; SLEEP APNEA, MILD; TRANSAMINASES, SERUM, ELEVATED; Multiple myeloma; TIA (transient ischemic attack); Transient diplopia; Pancytopenia; Fatigue; Insomnia; Reactive depression (situational); Well adult exam; Vitamin D deficiency; Hypothyroidism; Hypogonadism male; Abnormal brain MRI; Cough; Bacterial sinusitis; Cellulitis; Skin lesion, superficial; Hemorrhage of rectum and anus; Rash of entire body; Raynaud's phenomenon; and  Diarrhea on his problem list.     is allergic to crestor and lipitor.    Medication List       This list is accurate as of: 09/17/14 10:57 AM.  Always use your most recent med list.               aspirin EC 81 MG tablet  Take 81 mg by mouth every morning.     cetirizine 10 MG tablet  Commonly known as:  ZYRTEC  Take 10 mg by mouth daily.     cholecalciferol 1000 UNITS tablet  Commonly known as:  VITAMIN D  take 1 tablet by mouth once daily     DULCOLAX PO  Take 1 tablet by mouth.     escitalopram 10 MG tablet  Commonly known as:  LEXAPRO  take 1 tablet by mouth once daily     ibuprofen 200 MG tablet  Commonly known as:  ADVIL,MOTRIN  Take 400 mg by mouth every 8 (eight) hours as needed for pain.     lenalidomide 10 MG capsule  Commonly known as:  REVLIMID  Take 1 capsule (10 mg total) by mouth daily. 14 days on, 7 days off, then repeat     levothyroxine 150 MCG tablet  Commonly known as:  SYNTHROID, LEVOTHROID  Take 1 tablet (150 mcg total) by mouth daily before breakfast.     mupirocin ointment 2 %  Commonly known as:  BACTROBAN  Place small amount into each nostril twice daily x 5 days.  After application, press sides of nose together and gently massage.  omeprazole 40 MG capsule  Commonly known as:  PRILOSEC  Take 1 capsule (40 mg total) by mouth every morning.     ondansetron 8 MG tablet  Commonly known as:  ZOFRAN  Take 1 tablet (8 mg total) by mouth every 8 (eight) hours as needed for nausea or vomiting.     polyethylene glycol packet  Commonly known as:  MIRALAX / GLYCOLAX  Take 17 g by mouth as needed.     sildenafil 100 MG tablet  Commonly known as:  VIAGRA  Take 1 tablet (100 mg total) by mouth as needed for erectile dysfunction.         PHYSICAL EXAMINATION  Blood pressure 108/57, pulse 71, temperature 97.8 F (36.6 C), temperature source Oral, resp. rate 18, height _0  (1.778 m), weight 182 lb 3.2 oz (82.645 kg), SpO2 99  %.  Physical Exam  Constitutional: He is oriented to person, place, and time. Vital signs are normal. He appears unhealthy.  HENT:  Head: Normocephalic and atraumatic.  Mouth/Throat: No oropharyngeal exudate.  Eyes: Conjunctivae and EOM are normal. Pupils are equal, round, and reactive to light. Right eye exhibits no discharge. Left eye exhibits no discharge. No scleral icterus.  Neck: Normal range of motion. Neck supple. No JVD present. No tracheal deviation present. No thyromegaly present.  Cardiovascular: Normal rate, regular rhythm, normal heart sounds and intact distal pulses.   Pulmonary/Chest: Effort normal and breath sounds normal. No stridor. No respiratory distress. He has no wheezes. He has no rales. He exhibits no tenderness.  Abdominal: Soft. Bowel sounds are normal. He exhibits no distension and no mass. There is no tenderness. There is no rebound and no guarding.  Musculoskeletal: Normal range of motion. He exhibits no edema or tenderness.  Lymphadenopathy:    He has no cervical adenopathy.  Neurological: He is alert and oriented to person, place, and time. Gait normal.  Skin: Skin is warm and dry. No rash noted. No erythema. There is pallor.  Psychiatric: Affect normal.  Nursing note and vitals reviewed.   LABORATORY DATA:. Appointment on 09/17/2014  Component Date Value Ref Range Status  . WBC 09/17/2014 2.6* 4.0 - 10.3 10e3/uL Final  . NEUT# 09/17/2014 1.4* 1.5 - 6.5 10e3/uL Final  . HGB 09/17/2014 12.1* 13.0 - 17.1 g/dL Final  . HCT 09/17/2014 35.6* 38.4 - 49.9 % Final  . Platelets 09/17/2014 84* 140 - 400 10e3/uL Final  . MCV 09/17/2014 99.2* 79.3 - 98.0 fL Final  . MCH 09/17/2014 33.7* 27.2 - 33.4 pg Final  . MCHC 09/17/2014 34.0  32.0 - 36.0 g/dL Final  . RBC 09/17/2014 3.59* 4.20 - 5.82 10e6/uL Final  . RDW 09/17/2014 16.4* 11.0 - 14.6 % Final  . lymph# 09/17/2014 0.9  0.9 - 3.3 10e3/uL Final  . MONO# 09/17/2014 0.2  0.1 - 0.9 10e3/uL Final  . Eosinophils  Absolute 09/17/2014 0.2  0.0 - 0.5 10e3/uL Final  . Basophils Absolute 09/17/2014 0.0  0.0 - 0.1 10e3/uL Final  . NEUT% 09/17/2014 53.3  39.0 - 75.0 % Final  . LYMPH% 09/17/2014 33.2  14.0 - 49.0 % Final  . MONO% 09/17/2014 7.3  0.0 - 14.0 % Final  . EOS% 09/17/2014 5.8  0.0 - 7.0 % Final  . BASO% 09/17/2014 0.4  0.0 - 2.0 % Final  . Sodium 09/17/2014 139  136 - 145 mEq/L Final  . Potassium 09/17/2014 4.1  3.5 - 5.1 mEq/L Final  . Chloride 09/17/2014 109  98 - 109 mEq/L  Final  . CO2 09/17/2014 21* 22 - 29 mEq/L Final  . Glucose 09/17/2014 92  70 - 140 mg/dl Final  . BUN 09/17/2014 12.2  7.0 - 26.0 mg/dL Final  . Creatinine 09/17/2014 1.2  0.7 - 1.3 mg/dL Final  . Total Bilirubin 09/17/2014 0.59  0.20 - 1.20 mg/dL Final  . Alkaline Phosphatase 09/17/2014 84  40 - 150 U/L Final  . AST 09/17/2014 18  5 - 34 U/L Final  . ALT 09/17/2014 16  0 - 55 U/L Final  . Total Protein 09/17/2014 6.9  6.4 - 8.3 g/dL Final  . Albumin 09/17/2014 3.4* 3.5 - 5.0 g/dL Final  . Calcium 09/17/2014 8.8  8.4 - 10.4 mg/dL Final  . Anion Gap 09/17/2014 9  3 - 11 mEq/L Final  . EGFR 09/17/2014 61* >90 ml/min/1.73 m2 Final   eGFR is calculated using the CKD-EPI Creatinine Equation (2009)  Appointment on 09/16/2014  Component Date Value Ref Range Status  . Sodium 09/16/2014 139  135 - 145 mEq/L Final  . Potassium 09/16/2014 5.3* 3.5 - 5.1 mEq/L Final  . Chloride 09/16/2014 108  96 - 112 mEq/L Final  . CO2 09/16/2014 24  19 - 32 mEq/L Final  . Glucose, Bld 09/16/2014 89  70 - 99 mg/dL Final  . BUN 09/16/2014 20  6 - 23 mg/dL Final  . Creatinine, Ser 09/16/2014 1.4  0.4 - 1.5 mg/dL Final  . Calcium 09/16/2014 9.0  8.4 - 10.5 mg/dL Final  . GFR 09/16/2014 54.46* >60.00 mL/min Final  . Total Bilirubin 09/16/2014 0.6  0.2 - 1.2 mg/dL Final  . Bilirubin, Direct 09/16/2014 0.1  0.0 - 0.3 mg/dL Final  . Alkaline Phosphatase 09/16/2014 75  39 - 117 U/L Final  . AST 09/16/2014 21  0 - 37 U/L Final  . ALT 09/16/2014 16   0 - 53 U/L Final  . Total Protein 09/16/2014 7.0  6.0 - 8.3 g/dL Final  . Albumin 09/16/2014 3.8  3.5 - 5.2 g/dL Final  . Cholesterol 09/16/2014 165  0 - 200 mg/dL Final   ATP III Classification       Desirable:  < 200 mg/dL               Borderline High:  200 - 239 mg/dL          High:  > = 240 mg/dL  . Triglycerides 09/16/2014 107.0  0.0 - 149.0 mg/dL Final   Normal:  <150 mg/dLBorderline High:  150 - 199 mg/dL  . HDL 09/16/2014 37.60* >39.00 mg/dL Final  . VLDL 09/16/2014 21.4  0.0 - 40.0 mg/dL Final  . LDL Cholesterol 09/16/2014 106* 0 - 99 mg/dL Final  . Total CHOL/HDL Ratio 09/16/2014 4   Final                  Men          Women1/2 Average Risk     3.4          3.3Average Risk          5.0          4.42X Average Risk          9.6          7.13X Average Risk          15.0          11.0                      .  NonHDL 09/16/2014 127.40   Final   NOTE:  Non-HDL goal should be 30 mg/dL higher than patient's LDL goal (i.e. LDL goal of < 70 mg/dL, would have non-HDL goal of < 100 mg/dL)  . PSA 09/16/2014 1.12  0.10 - 4.00 ng/mL Final  . TSH 09/16/2014 0.12* 0.35 - 4.50 uIU/mL Final  . Color, Urine 09/16/2014 YELLOW  Yellow;Lt. Yellow Final  . APPearance 09/16/2014 CLEAR  Clear Final  . Specific Gravity, Urine 09/16/2014 1.010  1.000-1.030 Final  . pH 09/16/2014 7.0  5.0 - 8.0 Final  . Total Protein, Urine 09/16/2014 NEGATIVE  Negative Final  . Urine Glucose 09/16/2014 NEGATIVE  Negative Final  . Ketones, ur 09/16/2014 NEGATIVE  Negative Final  . Bilirubin Urine 09/16/2014 NEGATIVE  Negative Final  . Hgb urine dipstick 09/16/2014 SMALL* Negative Final  . Urobilinogen, UA 09/16/2014 0.2  0.0 - 1.0 Final  . Leukocytes, UA 09/16/2014 NEGATIVE  Negative Final  . Nitrite 09/16/2014 NEGATIVE  Negative Final  . WBC, UA 09/16/2014 0-2/hpf  0-2/hpf Final  . RBC / HPF 09/16/2014 3-6/hpf* 0-2/hpf Final  . Squamous Epithelial / LPF 09/16/2014 Rare(0-4/hpf)  Rare(0-4/hpf) Final  . Hyaline Casts, UA  09/16/2014 Presence of* None Final  Appointment on 09/15/2014  Component Date Value Ref Range Status  . WBC 09/15/2014 3.6* 4.0 - 10.3 10e3/uL Final  . NEUT# 09/15/2014 2.4  1.5 - 6.5 10e3/uL Final  . HGB 09/15/2014 11.2* 13.0 - 17.1 g/dL Final  . HCT 09/15/2014 33.9* 38.4 - 49.9 % Final  . Platelets 09/15/2014 94* 140 - 400 10e3/uL Final  . MCV 09/15/2014 100.6* 79.3 - 98.0 fL Final  . MCH 09/15/2014 33.1  27.2 - 33.4 pg Final  . MCHC 09/15/2014 32.9  32.0 - 36.0 g/dL Final  . RBC 09/15/2014 3.37* 4.20 - 5.82 10e6/uL Final  . RDW 09/15/2014 17.0* 11.0 - 14.6 % Final  . lymph# 09/15/2014 0.8* 0.9 - 3.3 10e3/uL Final  . MONO# 09/15/2014 0.2  0.1 - 0.9 10e3/uL Final  . Eosinophils Absolute 09/15/2014 0.1  0.0 - 0.5 10e3/uL Final  . Basophils Absolute 09/15/2014 0.0  0.0 - 0.1 10e3/uL Final  . NEUT% 09/15/2014 67.2  39.0 - 75.0 % Final  . LYMPH% 09/15/2014 21.8  14.0 - 49.0 % Final  . MONO% 09/15/2014 6.6  0.0 - 14.0 % Final  . EOS% 09/15/2014 3.8  0.0 - 7.0 % Final  . BASO% 09/15/2014 0.6  0.0 - 2.0 % Final  . Sodium 09/15/2014 137  136 - 145 mEq/L Final  . Potassium 09/15/2014 4.4  3.5 - 5.1 mEq/L Final  . Chloride 09/15/2014 108  98 - 109 mEq/L Final  . CO2 09/15/2014 21* 22 - 29 mEq/L Final  . Glucose 09/15/2014 87  70 - 140 mg/dl Final  . BUN 09/15/2014 15.3  7.0 - 26.0 mg/dL Final  . Creatinine 09/15/2014 1.1  0.7 - 1.3 mg/dL Final  . Total Bilirubin 09/15/2014 0.42  0.20 - 1.20 mg/dL Final  . Alkaline Phosphatase 09/15/2014 81  40 - 150 U/L Final  . AST 09/15/2014 20  5 - 34 U/L Final  . ALT 09/15/2014 16  0 - 55 U/L Final  . Total Protein 09/15/2014 7.0  6.4 - 8.3 g/dL Final  . Albumin 09/15/2014 3.4* 3.5 - 5.0 g/dL Final  . Calcium 09/15/2014 8.8  8.4 - 10.4 mg/dL Final  . Anion Gap 09/15/2014 8  3 - 11 mEq/L Final  . EGFR 09/15/2014 71* >90 ml/min/1.73 m2 Final  eGFR is calculated using the CKD-EPI Creatinine Equation (2009)  . Kappa free light chain 09/15/2014 5.22*  0.33 - 1.94 mg/dL Preliminary  . Lambda Free Lght Chn 09/15/2014 7.86* 0.57 - 2.63 mg/dL Preliminary  . Kappa:Lambda Ratio 09/15/2014 0.66  0.26 - 1.65 Preliminary     RADIOGRAPHIC STUDIES: No results found.  ASSESSMENT/PLAN:    Diarrhea Patient reports that he had diarrhea a total of 4 times yesterday; with the first episode causing some stool incontinence he was at work.  Patient states that he then took Imodium as directed; and this seems to have resolved all diarrhea symptoms.  Patient was able to eat some toast this morning with no recurrence of the diarrhea.  Discussed the need to continue with a clear liquid diet and to advance very slowly to a brat diet as tolerated.  Patient states that he does not feel dehydrated today.  Advised both patient and his fiance to call or go directly to the emergency department if he develops any worsening symptoms over the weekend.  Patient does admit to having issues with constipation alternating with diarrhea.  He states that he takes stool softeners to prevent any constipation; and then will subsequently often develop some diarrhea issues.  Patient also requested and given a refill for his Zofran today as well.  Multiple myeloma Patient continues the oral Revlimid therapy at a decreased maintenance therapy of 2 weeks on; with one week off.  He is scheduled to complete his next cycle of Revlimid this coming Monday, 09/21/2014.  His counts have improved with a WBC of 2.6 and an ANC of 1.4.  Hemoglobin remained stable at 12.1.  Platelet count however, has decreased from 94 down to 84.  Patient denies any worsening issues with either easy bleeding or bruising.  There was some discussion regarding patient taking a baby aspirin daily.  Advised patient not baby aspirin not needed when platelets are lower; but did advised patient to follow-up with both his primary care provider and Dr. [name] regarding future aspirin use.  Agent did obtain his light chains  and urine protein creatinine ratio lab results as well; but still pending are the SPEP results.  Advised patient I would follow-up with him as soon as the SPEP results are available.  Also, patient had a physical with his primary care physician Dr. Alain Marion just yesterday; it had additional labs drawn.  Did briefly review all of patient's lab results from that primary care visit.  Total cholesterol was 165.  PSA was within normal limits.  TSH was slightly low; and advised patient to follow-up with his primary care provider regarding possible Synthroid dosage adjustment.  Patient was also questioning his vitamin D level.  Was unable to add vitamin D level to today's already drawn labs.  Will add vitamin D level to next set of labs drawn here at the cancer center.  Patient was also questioning the scheduling of his next brain MRI.  Advised patient I would let him know regarding the scheduling of his next MRI.  Patient will continue with monthly lab draws.  He'll return on 12/01/2014 for his next follow-up visit here at the cancer center.  Patient stated understanding of all instructions; and was in agreement with this plan of care. The patient knows to call the clinic with any problems, questions or concerns.   Review/collaboration with Dr. [name] regarding all aspects of patient's visit today.   Total time spent with patient was 25 minutes;  with greater than 75  percent of that time spent in face to face counseling regarding his symptoms, and coordination of care and follow up.  Disclaimer: This note was dictated with voice recognition software. Similar sounding words can inadvertently be transcribed and may not be corrected upon review.   Drue Second, NP 09/17/2014

## 2014-09-17 NOTE — Assessment & Plan Note (Signed)
Patient reports that he had diarrhea a total of 4 times yesterday; with the first episode causing some stool incontinence he was at work.  Patient states that he then took Imodium as directed; and this seems to have resolved all diarrhea symptoms.  Patient was able to eat some toast this morning with no recurrence of the diarrhea.  Discussed the need to continue with a clear liquid diet and to advance very slowly to a brat diet as tolerated.  Patient states that he does not feel dehydrated today.  Advised both patient and his fiance to call or go directly to the emergency department if he develops any worsening symptoms over the weekend.  Patient does admit to having issues with constipation alternating with diarrhea.  He states that he takes stool softeners to prevent any constipation; and then will subsequently often develop some diarrhea issues.  Patient also requested and given a refill for his Zofran today as well.

## 2014-09-17 NOTE — Assessment & Plan Note (Signed)
Patient continues the oral Revlimid therapy at a decreased maintenance therapy of 2 weeks on; with one week off.  He is scheduled to complete his next cycle of Revlimid this coming Monday, 09/21/2014.  His counts have improved with a WBC of 2.6 and an ANC of 1.4.  Hemoglobin remained stable at 12.1.  Platelet count however, has decreased from 94 down to 84.  Patient denies any worsening issues with either easy bleeding or bruising.  There was some discussion regarding patient taking a baby aspirin daily.  Advised patient not baby aspirin not needed when platelets are lower; but did advised patient to follow-up with both his primary care provider and Dr. [name] regarding future aspirin use.  Agent did obtain his light chains and urine protein creatinine ratio lab results as well; but still pending are the SPEP results.  Advised patient I would follow-up with him as soon as the SPEP results are available.  Also, patient had a physical with his primary care physician Dr. Alain Marion just yesterday; it had additional labs drawn.  Did briefly review all of patient's lab results from that primary care visit.  Total cholesterol was 165.  PSA was within normal limits.  TSH was slightly low; and advised patient to follow-up with his primary care provider regarding possible Synthroid dosage adjustment.  Patient was also questioning his vitamin D level.  Was unable to add vitamin D level to today's already drawn labs.  Will add vitamin D level to next set of labs drawn here at the cancer center.  Patient was also questioning the scheduling of his next brain MRI.  Advised patient I would let him know regarding the scheduling of his next MRI.  Patient will continue with monthly lab draws.  He'll return on 12/01/2014 for his next follow-up visit here at the cancer center.

## 2014-09-22 ENCOUNTER — Other Ambulatory Visit: Payer: Self-pay | Admitting: *Deleted

## 2014-09-22 ENCOUNTER — Other Ambulatory Visit: Payer: Commercial Managed Care - PPO

## 2014-09-22 DIAGNOSIS — E875 Hyperkalemia: Secondary | ICD-10-CM

## 2014-09-22 DIAGNOSIS — E039 Hypothyroidism, unspecified: Secondary | ICD-10-CM

## 2014-09-29 ENCOUNTER — Other Ambulatory Visit: Payer: Commercial Managed Care - PPO

## 2014-09-29 ENCOUNTER — Other Ambulatory Visit: Payer: Self-pay | Admitting: Nurse Practitioner

## 2014-09-29 ENCOUNTER — Other Ambulatory Visit (HOSPITAL_BASED_OUTPATIENT_CLINIC_OR_DEPARTMENT_OTHER): Payer: Commercial Managed Care - PPO

## 2014-09-29 ENCOUNTER — Ambulatory Visit: Payer: Commercial Managed Care - PPO | Admitting: Oncology

## 2014-09-29 DIAGNOSIS — C9000 Multiple myeloma not having achieved remission: Secondary | ICD-10-CM

## 2014-09-29 DIAGNOSIS — E559 Vitamin D deficiency, unspecified: Secondary | ICD-10-CM

## 2014-09-29 LAB — CBC WITH DIFFERENTIAL/PLATELET
BASO%: 1.2 % (ref 0.0–2.0)
Basophils Absolute: 0 10*3/uL (ref 0.0–0.1)
EOS%: 9.1 % — ABNORMAL HIGH (ref 0.0–7.0)
Eosinophils Absolute: 0.2 10*3/uL (ref 0.0–0.5)
HCT: 33.9 % — ABNORMAL LOW (ref 38.4–49.9)
HEMOGLOBIN: 10.9 g/dL — AB (ref 13.0–17.1)
LYMPH%: 36.5 % (ref 14.0–49.0)
MCH: 33.1 pg (ref 27.2–33.4)
MCHC: 32.2 g/dL (ref 32.0–36.0)
MCV: 102.9 fL — ABNORMAL HIGH (ref 79.3–98.0)
MONO#: 0.6 10*3/uL (ref 0.1–0.9)
MONO%: 21.5 % — ABNORMAL HIGH (ref 0.0–14.0)
NEUT#: 0.9 10*3/uL — ABNORMAL LOW (ref 1.5–6.5)
NEUT%: 31.7 % — AB (ref 39.0–75.0)
Platelets: 92 10*3/uL — ABNORMAL LOW (ref 140–400)
RBC: 3.29 10*6/uL — ABNORMAL LOW (ref 4.20–5.82)
RDW: 17 % — ABNORMAL HIGH (ref 11.0–14.6)
WBC: 2.7 10*3/uL — ABNORMAL LOW (ref 4.0–10.3)
lymph#: 1 10*3/uL (ref 0.9–3.3)

## 2014-09-29 LAB — COMPREHENSIVE METABOLIC PANEL (CC13)
ALBUMIN: 3.5 g/dL (ref 3.5–5.0)
ALK PHOS: 76 U/L (ref 40–150)
ALT: 17 U/L (ref 0–55)
AST: 21 U/L (ref 5–34)
Anion Gap: 6 mEq/L (ref 3–11)
BUN: 17.1 mg/dL (ref 7.0–26.0)
CO2: 25 mEq/L (ref 22–29)
CREATININE: 1.1 mg/dL (ref 0.7–1.3)
Calcium: 9 mg/dL (ref 8.4–10.4)
Chloride: 108 mEq/L (ref 98–109)
EGFR: 67 mL/min/{1.73_m2} — AB (ref >90)
GLUCOSE: 79 mg/dL (ref 70–140)
POTASSIUM: 4.7 meq/L (ref 3.5–5.1)
Sodium: 139 mEq/L (ref 136–145)
Total Bilirubin: 0.35 mg/dL (ref 0.20–1.20)
Total Protein: 7 g/dL (ref 6.4–8.3)

## 2014-09-29 LAB — PROTEIN / CREATININE RATIO, URINE
Creatinine, Urine: 32.7 mg/dL
Protein Creatinine Ratio: 0.12 (ref <0.15)
Total Protein, Urine: 4 mg/dL — ABNORMAL LOW (ref 5–25)

## 2014-09-30 ENCOUNTER — Telehealth: Payer: Self-pay | Admitting: *Deleted

## 2014-09-30 LAB — VITAMIN D 25 HYDROXY (VIT D DEFICIENCY, FRACTURES): VIT D 25 HYDROXY: 32 ng/mL (ref 30–100)

## 2014-09-30 NOTE — Telephone Encounter (Signed)
Called patient for [employer] in reference to vitamin D level. Advised patient that level is 32 which is in the low range (30-100). Patient currently taking 1000 units a day. Advised patient that he may take 2000 units a day to help increase level. Patient verbalized understanding.

## 2014-10-02 ENCOUNTER — Telehealth: Payer: Self-pay | Admitting: Oncology

## 2014-10-02 NOTE — Telephone Encounter (Signed)
Faxed pt medical records to [facility]

## 2014-10-05 ENCOUNTER — Other Ambulatory Visit: Payer: Self-pay | Admitting: Internal Medicine

## 2014-10-08 ENCOUNTER — Encounter: Payer: Self-pay | Admitting: Nurse Practitioner

## 2014-10-09 NOTE — Telephone Encounter (Signed)
Spoke with Mr. Baruch and told him that his  Protein tests were normal per Nira Conn.  He needs to come in on 10-13-14 for labs as his ANC was low at 0.9.  Told Heather that Mr. Hoos is still taking his revlimid and will finish his 14 day cycle on 10-12-14.  Told Mr. Viscomi that he needs to call the office or go to the ED if he has a temp of 100.5 or higher.  Patient verbalized understanding.  He was given an appointment for 10-13-14 at 0815.

## 2014-10-13 ENCOUNTER — Other Ambulatory Visit: Payer: Self-pay | Admitting: *Deleted

## 2014-10-13 ENCOUNTER — Other Ambulatory Visit (HOSPITAL_BASED_OUTPATIENT_CLINIC_OR_DEPARTMENT_OTHER): Payer: Commercial Managed Care - PPO

## 2014-10-13 DIAGNOSIS — C9000 Multiple myeloma not having achieved remission: Secondary | ICD-10-CM

## 2014-10-13 LAB — CBC WITH DIFFERENTIAL/PLATELET
BASO%: 1.1 % (ref 0.0–2.0)
BASOS ABS: 0 10*3/uL (ref 0.0–0.1)
EOS%: 7.1 % — ABNORMAL HIGH (ref 0.0–7.0)
Eosinophils Absolute: 0.2 10*3/uL (ref 0.0–0.5)
HCT: 36.2 % — ABNORMAL LOW (ref 38.4–49.9)
HEMOGLOBIN: 11.7 g/dL — AB (ref 13.0–17.1)
LYMPH%: 28.2 % (ref 14.0–49.0)
MCH: 32.9 pg (ref 27.2–33.4)
MCHC: 32.2 g/dL (ref 32.0–36.0)
MCV: 102 fL — AB (ref 79.3–98.0)
MONO#: 0.3 10*3/uL (ref 0.1–0.9)
MONO%: 12 % (ref 0.0–14.0)
NEUT%: 51.6 % (ref 39.0–75.0)
NEUTROS ABS: 1.3 10*3/uL — AB (ref 1.5–6.5)
Platelets: 107 10*3/uL — ABNORMAL LOW (ref 140–400)
RBC: 3.55 10*6/uL — AB (ref 4.20–5.82)
RDW: 16.7 % — ABNORMAL HIGH (ref 11.0–14.6)
WBC: 2.6 10*3/uL — ABNORMAL LOW (ref 4.0–10.3)
lymph#: 0.7 10*3/uL — ABNORMAL LOW (ref 0.9–3.3)

## 2014-10-13 LAB — COMPREHENSIVE METABOLIC PANEL (CC13)
ALT: 21 U/L (ref 0–55)
AST: 21 U/L (ref 5–34)
Albumin: 3.6 g/dL (ref 3.5–5.0)
Alkaline Phosphatase: 70 U/L (ref 40–150)
Anion Gap: 10 mEq/L (ref 3–11)
BUN: 18.3 mg/dL (ref 7.0–26.0)
CHLORIDE: 109 meq/L (ref 98–109)
CO2: 20 meq/L — AB (ref 22–29)
Calcium: 8.6 mg/dL (ref 8.4–10.4)
Creatinine: 1.4 mg/dL — ABNORMAL HIGH (ref 0.7–1.3)
EGFR: 55 mL/min/{1.73_m2} — ABNORMAL LOW (ref >90)
GLUCOSE: 99 mg/dL (ref 70–140)
Potassium: 4.2 mEq/L (ref 3.5–5.1)
Sodium: 138 mEq/L (ref 136–145)
TOTAL PROTEIN: 7 g/dL (ref 6.4–8.3)
Total Bilirubin: 0.54 mg/dL (ref 0.20–1.20)

## 2014-10-13 MED ORDER — LENALIDOMIDE 10 MG PO CAPS: 10.0000 mg | ORAL_CAPSULE | Freq: Every day | ORAL | Status: DC

## 2014-10-13 NOTE — Telephone Encounter (Signed)
Faxed Rx of Revlimid to [facility]. Authorization # [id number].

## 2014-10-14 ENCOUNTER — Telehealth: Payer: Self-pay | Admitting: *Deleted

## 2014-10-14 NOTE — Telephone Encounter (Signed)
[facility] called requesting Revlimid refill as it needs a valid authorization number.

## 2014-10-15 ENCOUNTER — Other Ambulatory Visit: Payer: Self-pay | Admitting: *Deleted

## 2014-10-15 DIAGNOSIS — C9000 Multiple myeloma not having achieved remission: Secondary | ICD-10-CM

## 2014-10-15 MED ORDER — LENALIDOMIDE 10 MG PO CAPS: 10.0000 mg | ORAL_CAPSULE | Freq: Every day | ORAL | Status: DC

## 2014-10-23 ENCOUNTER — Other Ambulatory Visit (INDEPENDENT_AMBULATORY_CARE_PROVIDER_SITE_OTHER): Payer: Commercial Managed Care - PPO

## 2014-10-23 DIAGNOSIS — E039 Hypothyroidism, unspecified: Secondary | ICD-10-CM

## 2014-10-23 DIAGNOSIS — E875 Hyperkalemia: Secondary | ICD-10-CM

## 2014-10-23 LAB — BASIC METABOLIC PANEL
BUN: 19 mg/dL (ref 6–23)
CO2: 24 mEq/L (ref 19–32)
CREATININE: 1.22 mg/dL (ref 0.40–1.50)
Calcium: 9.1 mg/dL (ref 8.4–10.5)
Chloride: 108 mEq/L (ref 96–112)
GFR: 63.29 mL/min (ref >60.00)
GLUCOSE: 69 mg/dL — AB (ref 70–99)
Potassium: 4.1 mEq/L (ref 3.5–5.1)
SODIUM: 138 meq/L (ref 135–145)

## 2014-10-23 LAB — T4, FREE: Free T4: 1.15 ng/dL (ref 0.60–1.60)

## 2014-10-23 LAB — TSH: TSH: 0.14 u[IU]/mL — ABNORMAL LOW (ref 0.35–4.50)

## 2014-10-25 ENCOUNTER — Other Ambulatory Visit: Payer: Self-pay | Admitting: Oncology

## 2014-10-26 ENCOUNTER — Other Ambulatory Visit: Payer: Self-pay | Admitting: Internal Medicine

## 2014-10-26 ENCOUNTER — Other Ambulatory Visit: Payer: Self-pay

## 2014-10-26 ENCOUNTER — Encounter: Payer: Self-pay | Admitting: Oncology

## 2014-10-26 DIAGNOSIS — C9000 Multiple myeloma not having achieved remission: Secondary | ICD-10-CM

## 2014-10-26 MED ORDER — LENALIDOMIDE 5 MG PO CAPS: 5.0000 mg | ORAL_CAPSULE | Freq: Every day | ORAL | Status: DC

## 2014-10-26 NOTE — Telephone Encounter (Signed)
Per Dr [name], dose to be reduced to 5 mg, 7 days on 7 days off then repeat.    New authorization rcvd from [employer], prescription printed and sent to [facility]. Notes from [facility] dtd 10/01/14 reviewed by Dr [name].  Sent to scan.

## 2014-10-27 ENCOUNTER — Other Ambulatory Visit: Payer: Self-pay | Admitting: *Deleted

## 2014-10-27 ENCOUNTER — Other Ambulatory Visit (HOSPITAL_BASED_OUTPATIENT_CLINIC_OR_DEPARTMENT_OTHER): Payer: Commercial Managed Care - PPO

## 2014-10-27 DIAGNOSIS — C9000 Multiple myeloma not having achieved remission: Secondary | ICD-10-CM

## 2014-10-27 LAB — COMPREHENSIVE METABOLIC PANEL (CC13)
ALBUMIN: 3.6 g/dL (ref 3.5–5.0)
ALT: 20 U/L (ref 0–55)
ANION GAP: 10 meq/L (ref 3–11)
AST: 22 U/L (ref 5–34)
Alkaline Phosphatase: 81 U/L (ref 40–150)
BUN: 17.8 mg/dL (ref 7.0–26.0)
CHLORIDE: 106 meq/L (ref 98–109)
CO2: 22 meq/L (ref 22–29)
Calcium: 9.5 mg/dL (ref 8.4–10.4)
Creatinine: 1.3 mg/dL (ref 0.7–1.3)
EGFR: 57 mL/min/{1.73_m2} — ABNORMAL LOW (ref >90)
Glucose: 94 mg/dl (ref 70–140)
POTASSIUM: 4.2 meq/L (ref 3.5–5.1)
SODIUM: 138 meq/L (ref 136–145)
TOTAL PROTEIN: 7.2 g/dL (ref 6.4–8.3)
Total Bilirubin: 0.34 mg/dL (ref 0.20–1.20)

## 2014-10-27 LAB — CBC WITH DIFFERENTIAL/PLATELET
BASO%: 1.8 % (ref 0.0–2.0)
BASOS ABS: 0 10*3/uL (ref 0.0–0.1)
EOS ABS: 0.2 10*3/uL (ref 0.0–0.5)
EOS%: 9.4 % — ABNORMAL HIGH (ref 0.0–7.0)
HEMATOCRIT: 35.8 % — AB (ref 38.4–49.9)
HEMOGLOBIN: 11.7 g/dL — AB (ref 13.0–17.1)
LYMPH#: 0.6 10*3/uL — AB (ref 0.9–3.3)
LYMPH%: 26.7 % (ref 14.0–49.0)
MCH: 33 pg (ref 27.2–33.4)
MCHC: 32.7 g/dL (ref 32.0–36.0)
MCV: 100.8 fL — ABNORMAL HIGH (ref 79.3–98.0)
MONO#: 0.1 10*3/uL (ref 0.1–0.9)
MONO%: 6.2 % (ref 0.0–14.0)
NEUT%: 55.9 % (ref 39.0–75.0)
NEUTROS ABS: 1.3 10*3/uL — AB (ref 1.5–6.5)
Platelets: 116 10*3/uL — ABNORMAL LOW (ref 140–400)
RBC: 3.55 10*6/uL — ABNORMAL LOW (ref 4.20–5.82)
RDW: 16.5 % — AB (ref 11.0–14.6)
WBC: 2.4 10*3/uL — ABNORMAL LOW (ref 4.0–10.3)

## 2014-10-27 LAB — PROTEIN / CREATININE RATIO, URINE
Creatinine, Urine: 101.6 mg/dL
Protein Creatinine Ratio: 0.14 (ref <0.15)
Total Protein, Urine: 14 mg/dL (ref 5–25)

## 2014-10-27 MED ORDER — LEVOTHYROXINE SODIUM 150 MCG PO TABS: 150.0000 ug | ORAL_TABLET | Freq: Every day | ORAL | Status: DC

## 2014-10-29 LAB — PROTEIN ELECTROPHORESIS, SERUM
Albumin ELP: 54.3 % — ABNORMAL LOW (ref 55.8–66.1)
Alpha-1-Globulin: 4.3 % (ref 2.9–4.9)
Alpha-2-Globulin: 10.8 % (ref 7.1–11.8)
Beta 2: 5.1 % (ref 3.2–6.5)
Beta Globulin: 7 % (ref 4.7–7.2)
Gamma Globulin: 18.5 % (ref 11.1–18.8)
M-SPIKE, %: 0.69 g/dL
Total Protein, Serum Electrophoresis: 7 g/dL (ref 6.0–8.3)

## 2014-10-29 LAB — KAPPA/LAMBDA LIGHT CHAINS
Kappa free light chain: 5.58 mg/dL — ABNORMAL HIGH (ref 0.33–1.94)
Kappa:Lambda Ratio: 0.66 (ref 0.26–1.65)
LAMBDA FREE LGHT CHN: 8.42 mg/dL — AB (ref 0.57–2.63)

## 2014-11-16 ENCOUNTER — Other Ambulatory Visit: Payer: Self-pay | Admitting: *Deleted

## 2014-11-16 DIAGNOSIS — K279 Peptic ulcer, site unspecified, unspecified as acute or chronic, without hemorrhage or perforation: Secondary | ICD-10-CM

## 2014-11-16 DIAGNOSIS — C9000 Multiple myeloma not having achieved remission: Secondary | ICD-10-CM

## 2014-11-16 MED ORDER — OMEPRAZOLE 40 MG PO CPDR: 40.0000 mg | DELAYED_RELEASE_CAPSULE | Freq: Every morning | ORAL | Status: DC

## 2014-11-18 ENCOUNTER — Other Ambulatory Visit: Payer: Self-pay | Admitting: *Deleted

## 2014-11-18 NOTE — Telephone Encounter (Signed)
Received faxed request for Revlimid refill.  Pt. Now on 5mg  - 7 days on and then 7 days off.  Reduced dose due to cytopenias.  Pt. Due for labs on 3/8 and then see Dr. [name] on 3/15.  Will give to Dr. [name] for his review.

## 2014-11-24 ENCOUNTER — Other Ambulatory Visit (HOSPITAL_BASED_OUTPATIENT_CLINIC_OR_DEPARTMENT_OTHER): Payer: Commercial Managed Care - PPO

## 2014-11-24 DIAGNOSIS — C9000 Multiple myeloma not having achieved remission: Secondary | ICD-10-CM

## 2014-11-24 LAB — CBC WITH DIFFERENTIAL/PLATELET
BASO%: 0.3 % (ref 0.0–2.0)
Basophils Absolute: 0 10*3/uL (ref 0.0–0.1)
EOS%: 5.6 % (ref 0.0–7.0)
Eosinophils Absolute: 0.2 10*3/uL (ref 0.0–0.5)
HCT: 35.5 % — ABNORMAL LOW (ref 38.4–49.9)
HEMOGLOBIN: 12 g/dL — AB (ref 13.0–17.1)
LYMPH%: 29.5 % (ref 14.0–49.0)
MCH: 33 pg (ref 27.2–33.4)
MCHC: 33.8 g/dL (ref 32.0–36.0)
MCV: 97.5 fL (ref 79.3–98.0)
MONO#: 0.3 10*3/uL (ref 0.1–0.9)
MONO%: 9.1 % (ref 0.0–14.0)
NEUT%: 55.5 % (ref 39.0–75.0)
NEUTROS ABS: 1.8 10*3/uL (ref 1.5–6.5)
Platelets: 95 10*3/uL — ABNORMAL LOW (ref 140–400)
RBC: 3.64 10*6/uL — AB (ref 4.20–5.82)
RDW: 15 % — ABNORMAL HIGH (ref 11.0–14.6)
WBC: 3.2 10*3/uL — ABNORMAL LOW (ref 4.0–10.3)
lymph#: 0.9 10*3/uL (ref 0.9–3.3)

## 2014-11-24 LAB — COMPREHENSIVE METABOLIC PANEL (CC13)
ALBUMIN: 3.6 g/dL (ref 3.5–5.0)
ALK PHOS: 77 U/L (ref 40–150)
ALT: 17 U/L (ref 0–55)
ANION GAP: 10 meq/L (ref 3–11)
AST: 19 U/L (ref 5–34)
BUN: 22.6 mg/dL (ref 7.0–26.0)
CO2: 20 mEq/L — ABNORMAL LOW (ref 22–29)
Calcium: 9.3 mg/dL (ref 8.4–10.4)
Chloride: 106 mEq/L (ref 98–109)
Creatinine: 1.4 mg/dL — ABNORMAL HIGH (ref 0.7–1.3)
EGFR: 54 mL/min/{1.73_m2} — AB (ref >90)
GLUCOSE: 130 mg/dL (ref 70–140)
Potassium: 4.4 mEq/L (ref 3.5–5.1)
SODIUM: 136 meq/L (ref 136–145)
TOTAL PROTEIN: 7 g/dL (ref 6.4–8.3)
Total Bilirubin: 0.46 mg/dL (ref 0.20–1.20)

## 2014-11-24 LAB — PROTEIN / CREATININE RATIO, URINE
Creatinine, Urine: 146.8 mg/dL
PROTEIN CREATININE RATIO: 0.1 (ref <0.15)
Total Protein, Urine: 14 mg/dL (ref 5–25)

## 2014-11-25 ENCOUNTER — Other Ambulatory Visit: Payer: Self-pay | Admitting: Emergency Medicine

## 2014-11-25 DIAGNOSIS — C9000 Multiple myeloma not having achieved remission: Secondary | ICD-10-CM

## 2014-11-25 MED ORDER — LENALIDOMIDE 5 MG PO CAPS: 5.0000 mg | ORAL_CAPSULE | Freq: Every day | ORAL | Status: DC

## 2014-11-26 LAB — PROTEIN ELECTROPHORESIS, SERUM
ALPHA-2-GLOBULIN: 10.4 % (ref 7.1–11.8)
Albumin ELP: 56.2 % (ref 55.8–66.1)
Alpha-1-Globulin: 3.7 % (ref 2.9–4.9)
Beta 2: 4.2 % (ref 3.2–6.5)
Beta Globulin: 7.2 % (ref 4.7–7.2)
Gamma Globulin: 18.3 % (ref 11.1–18.8)
M-Spike, %: 0.7 g/dL
TOTAL PROTEIN, SERUM ELECTROPHOR: 6.9 g/dL (ref 6.0–8.3)

## 2014-11-26 LAB — KAPPA/LAMBDA LIGHT CHAINS
KAPPA LAMBDA RATIO: 0.39 (ref 0.26–1.65)
Kappa free light chain: 3.46 mg/dL — ABNORMAL HIGH (ref 0.33–1.94)
LAMBDA FREE LGHT CHN: 8.91 mg/dL — AB (ref 0.57–2.63)

## 2014-11-27 ENCOUNTER — Encounter: Payer: Self-pay | Admitting: Nurse Practitioner

## 2014-12-01 ENCOUNTER — Telehealth: Payer: Self-pay | Admitting: Oncology

## 2014-12-01 ENCOUNTER — Ambulatory Visit (HOSPITAL_BASED_OUTPATIENT_CLINIC_OR_DEPARTMENT_OTHER): Payer: Commercial Managed Care - PPO | Admitting: Oncology

## 2014-12-01 VITALS — BP 109/84 | HR 69 | Temp 98.3°F | Resp 18 | Ht 70.0 in | Wt 181.9 lb

## 2014-12-01 DIAGNOSIS — J4531 Mild persistent asthma with (acute) exacerbation: Secondary | ICD-10-CM

## 2014-12-01 DIAGNOSIS — C9000 Multiple myeloma not having achieved remission: Secondary | ICD-10-CM

## 2014-12-01 DIAGNOSIS — R9089 Other abnormal findings on diagnostic imaging of central nervous system: Secondary | ICD-10-CM

## 2014-12-01 DIAGNOSIS — G459 Transient cerebral ischemic attack, unspecified: Secondary | ICD-10-CM

## 2014-12-01 DIAGNOSIS — D61818 Other pancytopenia: Secondary | ICD-10-CM

## 2014-12-01 NOTE — Progress Notes (Signed)
ID: Andre [name]   DOB: 02/10/1951  MR#: [id number]  WUJ#:[id number]   PCP: Andre [name] SU: OTHER MD: Andre [name], Andre [name], Andre [name], Andre [name] [phone])  CHIEF COMPLAINT: Multiple myeloma  CURRENT TREATMENT: Lenalidomide maintenance   HISTORY OF PRESENT ILLNESS: From the original intake nodes:  The patient was worked up for peptic ulcer disease in August of 2011, with significant bleeding and anemia.  The patient was Helicobacter pylori negative.  He received some epinephrine when he had his EGD and then started on Protonix.  [name] anemia slowly resolved so that by September, his hemoglobin was up to 10.9 and by earlier this month, his hemoglobin was up to 12.5.    As part of his general workup, he was found to have a slightly elevated globulin fraction.  In September, his total protein was 8.3 with an albumin of 3.8.  In January, the total protein was 8.4 and albumin 3.6.  With persistence of this slight abnormality, Dr. Olevia [name] obtained serum immunofixation and SPEP.  The SPTP showed an M-spike of 2.67 grams.  A total IgG was 4,190.  Total IgA low at 28.  Total IgM low at 28 also.  The immunofixation showed a monoclonal IgG lambda paraprotein.  There were also monoclonal free lambda light chains present. With this information, the patient was referred for further evaluation.   A diagnosis of myeloma was confirmed by bone marrow biopsy and subsequebnt treatment is as detailed below  INTERVAL HISTORY: Andre [name] returns today for followup of his multiple myeloma. He had his wife Andre [name] went to [place] for their honeymoon and he really enjoyed that. He continues on lenalidomide 5 mg daily, 1 week on 1 week off. On this dose he feels "pretty good". He still complains of mild constipation. Occasionally he takes a laxative. He is not hydrating himself as much as I would like.  REVIEW OF SYSTEMS: Andre [name] has some "night sweats" at times. He has some  ringing in his ear and just had an episode of sore throat, all likely related to allergy issues. He has very infrequent headaches which are mild. He continues to have mild peripheral neuropathy symptoms. He feels forgetful and is interested in coming to our "chemo brain" talk. A detailed review of systems today was otherwise noncontributory   PAST MEDICAL HISTORY: Past Medical History  Diagnosis Date  . Hypertension   . Hyperlipidemia   . Duodenal ulcer   . Multiple myeloma 07/04/2011  . Allergy   . Thyroid disease   . GERD (gastroesophageal reflux disease)   . Depression   . Anemia   Significant for peptic ulcer disease as noted above.  History of hyperlipidemia.  History of anxiety and depression.  History of GERD which is significantly improved with weight loss and history of reactive airway disease, possibly secondary to the GERD which also improved with weight loss.  There was a history of obesity, now much improved secondary to exercise and diet.   PAST SURGICAL HISTORY: Past Surgical History  Procedure Laterality Date  . Cardiolite study  11/25/2003    NORMAL  . Bone marrow transplant  2011    for MM  . Tonsillectomy      FAMILY HISTORY Family History  Problem Relation Age of Onset  . Throat cancer Mother   . Hypertension Father   . Stroke Father   . Asthma Father   . Diabetes Father   . Pancreatic cancer Brother   [name] father died  at the age of 66 following a stroke.  [name] mother died with cancer of the throat at the age of 66.   She had a history of depression and was a smoker.  Not clear how much alcohol she drank, according to the patient.  The patient had two brothers.  One died from the age of 47 from a "kidney problem."  The second one died at the age of 22 from pancreatic cancer. (This may have been duodenal cancer. 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  His wife of 37+ years was Andre [name], but they are now divorced. In 2016 he remarried, his current wife's name isKami.  The patient has two daughters:  Andre [name], who works in [place], who is expecting her second child April 2016. .  They both live here in [place]. 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  The patient is very close to his daughters.  Andre [name] first daughter, Andre [name], born 03/06/2013, apparently has Weber/Osler/Rendu. The patient is not a church attender.  He does derive a great deal of support from friends at the "[employer] which he attends very regularly, and also participates in Al-Anon even though actually there is no alcohol or drug problem in the family.  He participates in this group and he gets quite a bit of support from it.    ADVANCED DIRECTIVES: In place  HEALTH MAINTENANCE: (updated 07/16/2013) History  Substance Use Topics  . Smoking status: Former Smoker -- 3 years    Quit date: 03/27/1969  . Smokeless tobacco: Never Used  . Alcohol Use: No     Colonoscopy:  PSA:  Sept 2014, normal/Dr. [name]  Lipid panel: Sept 2014/Dr. [name]   Allergies  Allergen Reactions  . Crestor [Rosuvastatin Calcium]     ADVERSE EFFECTS ON LIVER  . Lipitor [Atorvastatin Calcium]     Current Outpatient Prescriptions  Medication Sig Dispense Refill  . aspirin EC 81 MG tablet Take 81 mg by mouth every morning.    . Bisacodyl (DULCOLAX PO) Take 1 tablet by mouth.    . cetirizine (ZYRTEC) 10 MG tablet Take 10 mg by mouth daily.    . cholecalciferol (VITAMIN D) 1000 UNITS tablet take 1 tablet by mouth once daily 100 tablet 3  . escitalopram (LEXAPRO) 10 MG tablet Take 1 tablet (10 mg total) by mouth daily. 30 tablet 5  . ibuprofen (ADVIL,MOTRIN) 200 MG tablet Take 400 mg by mouth every 8 (eight) hours as needed for pain.     [name] lenalidomide  (REVLIMID) 5 MG capsule Take 1 capsule (5 mg total) by mouth daily. 7 days on, 7 days off, then repeat 14 capsule 1  . levothyroxine (SYNTHROID, LEVOTHROID) 150 MCG tablet Take 1 tablet (150 mcg total) by mouth daily before breakfast. 90 tablet 2  . mupirocin ointment (BACTROBAN) 2 % Place small amount into each nostril twice daily x 5 days.  After application, press sides of nose together and gently massage. 22 g 0  . omeprazole (PRILOSEC) 40 MG capsule Take 1 capsule (40 mg total) by mouth every morning. 90 capsule 3  . ondansetron (ZOFRAN) 8 MG tablet Take 1 tablet (8 mg total) by mouth every 8 (eight) hours as needed for nausea or vomiting. 20 tablet 1  . polyethylene  glycol (MIRALAX / GLYCOLAX) packet Take 17 g by mouth as needed.     . sildenafil (VIAGRA) 100 MG tablet Take 1 tablet (100 mg total) by mouth as needed for erectile dysfunction. 12 tablet 11  . [DISCONTINUED] fexofenadine (ALLEGRA) 60 MG tablet Take 180 mg by mouth daily.       No current facility-administered medications for this visit.    OBJECTIVE: Middle-aged white man in no acute distress Filed Vitals:   12/01/14 0932  BP: 109/84  Pulse: 69  Temp: 98.3 F (36.8 C)  Resp: 18   Body mass index is 26.1 kg/(m^2).  Sclerae unicteric, pupils equal and reactive Oropharynx clear and moist-- no thrush or other lesionsNo cervical or supraclavicular adenopathy Lungs no rales or rhonchi Heart regular rate and rhythm Abd soft, nontender, positive bowel sounds MSK no focal spinal tenderness, no upper extremity lymphedema Neuro: nonfocal, well oriented, appropriate affect Breasts:     LAB RESULTS:  Lab Results  Component Value Date   WBC 3.2* 11/24/2014   NEUTROABS 1.8 11/24/2014   HGB 12.0* 11/24/2014   HCT 35.5* 11/24/2014   MCV 97.5 11/24/2014   PLT 95* 11/24/2014      Chemistry      Component Value Date/Time   NA 136 11/24/2014 0755   NA 138 10/23/2014 0842   K 4.4 11/24/2014 0755   K 4.1 10/23/2014  0842   CL 108 10/23/2014 0842   CL 107 03/10/2013 1352   CO2 20* 11/24/2014 0755   CO2 24 10/23/2014 0842   BUN 22.6 11/24/2014 0755   BUN 19 10/23/2014 0842   CREATININE 1.4* 11/24/2014 0755   CREATININE 1.22 10/23/2014 0842      Component Value Date/Time   CALCIUM 9.3 11/24/2014 0755   CALCIUM 9.1 10/23/2014 0842   ALKPHOS 77 11/24/2014 0755   ALKPHOS 75 09/16/2014 0750   AST 19 11/24/2014 0755   AST 21 09/16/2014 0750   ALT 17 11/24/2014 0755   ALT 16 09/16/2014 0750   BILITOT 0.46 11/24/2014 0755   BILITOT 0.6 09/16/2014 0750     .Results for ZEIN, HELBING (MRN [id number]) as of 12/01/2014 10:24  Ref. Range 07/07/2014 08:03 08/04/2014 08:01 09/15/2014 08:11 10/27/2014 08:31 11/24/2014 07:55  M-SPIKE, % Latest Units: g/dL 0.69 0.68 0.67 0.69 0.70   Results for KYSHAUN, BARNETTE (MRN [id number]) as of 12/01/2014 10:24  Ref. Range 07/07/2014 08:03 08/04/2014 08:01 09/15/2014 08:11 10/27/2014 08:31 11/24/2014 07:55  Kappa:Lambda Ratio Latest Range: 0.26-1.65  0.53 0.40 0.66 0.66 0.39   STUDIES: No results found.  ASSESSMENT: 64 y.o.  [place] man with a history of multiple myeloma diagnosed February of 2012, with an initial M-spike of 2.6 g/dL, IFE showing an IgG lambda paraprotein and free lambda light chains in the urine. Cytogenetics showed trisomy 69. Bone marrow biopsy showed a 22% plasmacytosis. Treated with   (1) bortezomib (subcutaneously), lenalidomide, and dexamethasone, with repeat bone marrow biopsy May of 2012 showing 10% plasmacytosis   (2) high-dose chemotherapy with BCNU and melphalan at [facility], followed by stem cell rescue July of 2012   (3) on zoledronic acid started December of 2012, initially monthly, currently given every 3 months, most recent dose 02/17/2014  (4) low-dose lenalidomide resumed April 2013, interrupted several times since.  Resumed again on 02/19/2013, and a dose of 5 mg daily, 21 days on and 7 days off.  (5) CNS symptoms and abnormal brain MRI  extensively evaluated by neurology with no definitive diagnosis established; improving   (6)  rising M spike noted June 2014 but did not meet criteria for carfilzomib study  (7) on lenalidomide 25 mg daily, 14 days on, 7 days off, starting 04/18/2013, interrupted December 2014 because of rash;   (a) resumed January 2015 at 10 mg/ day at 21 days on/ 7 days off  (Cassiel) starting 08/18/2014 decreasedto 10 mg/ day 14 days on, 7 days off because of cytopenias  (c) as of February 2016 he has been on 5 mg daily 7 days on 7 days off   PLAN:  Andre [name] is tolerating his current dose and schedule of Revlimid excellently. He does have some constipation associated with this, but then he has always been on the constipated side. His numbers are very stable and so long as there is not a significant progression the plan is to continue the Revlimid. If I when there is progression he will enroll in the carfilzomib study.  We discussed infection prevention issues but I encouraged him to continue all his regular activities. He is also trying to increase his exercise tolerance.  We will continue to check his labs on a monthly basis. He will see me again in 3 months. He knows to call for any problems that may develop before then.   [name]  Chauncey Cruel, MD     12/01/2014

## 2014-12-01 NOTE — Telephone Encounter (Signed)
appts made and time moved later per pt req,[place] per pt req to see barb neff and NUT consult  anne

## 2014-12-07 ENCOUNTER — Ambulatory Visit: Payer: Commercial Managed Care - PPO | Admitting: Nutrition

## 2014-12-07 NOTE — Progress Notes (Signed)
64 year old male diagnosed with multiple myeloma in February 2012.  He is a patient of Dr. [name].  Past medical history includes hypertension, hyperlipidemia, duodenal ulcer, GERD, depression, anemia, and obesity.  Medications include Dulcolax, vitamin D, Revlimid, Synthroid, Prilosec, and MiraLAX.  Height: 5 feet 10 inches. Weight: 185.2 pounds. Usual body weight: 180 pounds. BMI: 26.57.  Patient would like review of healthy diet during treatment for multiple myeloma. Patient is a vegetarian but eats fish. Complains of constipation on Revlimid.  This has been improved as dosage has been decreased.  Nutrition diagnosis: Food and nutrition related knowledge deficit related to multiple myeloma as evidenced by no prior need for nutrition related information.  Intervention: Patient educated to consume adequate protein and calories for weight maintenance. Recommended patient continue exercise as permitted by physician. Reviewed strategies for dealing with constipation. Many questions were answered.  Teach back method used.  Contact information was given.  Monitoring, evaluation, goals: Patient will consume healthy diet with increased vegetables to promote weight maintenance.  Nutrition diagnosis resolved.  **Disclaimer: This note was dictated with voice recognition software. Similar sounding words can inadvertently be transcribed and this note may contain transcription errors which may not have been corrected upon publication of note.**

## 2014-12-22 ENCOUNTER — Other Ambulatory Visit (HOSPITAL_BASED_OUTPATIENT_CLINIC_OR_DEPARTMENT_OTHER): Payer: Commercial Managed Care - PPO

## 2014-12-22 DIAGNOSIS — C9000 Multiple myeloma not having achieved remission: Secondary | ICD-10-CM

## 2014-12-22 LAB — COMPREHENSIVE METABOLIC PANEL (CC13)
ALBUMIN: 3.4 g/dL — AB (ref 3.5–5.0)
ALT: 28 U/L (ref 0–55)
AST: 24 U/L (ref 5–34)
Alkaline Phosphatase: 78 U/L (ref 40–150)
Anion Gap: 8 mEq/L (ref 3–11)
BUN: 24.7 mg/dL (ref 7.0–26.0)
CALCIUM: 9.2 mg/dL (ref 8.4–10.4)
CHLORIDE: 108 meq/L (ref 98–109)
CO2: 20 mEq/L — ABNORMAL LOW (ref 22–29)
Creatinine: 1.5 mg/dL — ABNORMAL HIGH (ref 0.7–1.3)
EGFR: 49 mL/min/{1.73_m2} — ABNORMAL LOW (ref >90)
Glucose: 96 mg/dl (ref 70–140)
POTASSIUM: 4.1 meq/L (ref 3.5–5.1)
Sodium: 136 mEq/L (ref 136–145)
Total Bilirubin: 0.44 mg/dL (ref 0.20–1.20)
Total Protein: 6.8 g/dL (ref 6.4–8.3)

## 2014-12-22 LAB — CBC WITH DIFFERENTIAL/PLATELET
BASO%: 0.4 % (ref 0.0–2.0)
Basophils Absolute: 0 10*3/uL (ref 0.0–0.1)
EOS%: 5.4 % (ref 0.0–7.0)
Eosinophils Absolute: 0.1 10*3/uL (ref 0.0–0.5)
HCT: 32.9 % — ABNORMAL LOW (ref 38.4–49.9)
HGB: 11.2 g/dL — ABNORMAL LOW (ref 13.0–17.1)
LYMPH#: 0.6 10*3/uL — AB (ref 0.9–3.3)
LYMPH%: 27.4 % (ref 14.0–49.0)
MCH: 32.9 pg (ref 27.2–33.4)
MCHC: 34 g/dL (ref 32.0–36.0)
MCV: 96.8 fL (ref 79.3–98.0)
MONO#: 0.3 10*3/uL (ref 0.1–0.9)
MONO%: 13.9 % (ref 0.0–14.0)
NEUT%: 52.9 % (ref 39.0–75.0)
NEUTROS ABS: 1.2 10*3/uL — AB (ref 1.5–6.5)
Platelets: 74 10*3/uL — ABNORMAL LOW (ref 140–400)
RBC: 3.4 10*6/uL — ABNORMAL LOW (ref 4.20–5.82)
RDW: 15.4 % — ABNORMAL HIGH (ref 11.0–14.6)
WBC: 2.2 10*3/uL — ABNORMAL LOW (ref 4.0–10.3)

## 2014-12-23 LAB — PROTEIN / CREATININE RATIO, URINE
CREATININE, URINE: 143.6 mg/dL
Protein Creatinine Ratio: 0.14 (ref <0.15)
Total Protein, Urine: 20 mg/dL (ref 5–25)

## 2014-12-24 LAB — KAPPA/LAMBDA LIGHT CHAINS
KAPPA FREE LGHT CHN: 3.87 mg/dL — AB (ref 0.33–1.94)
Kappa:Lambda Ratio: 0.33 (ref 0.26–1.65)
Lambda Free Lght Chn: 11.6 mg/dL — ABNORMAL HIGH (ref 0.57–2.63)

## 2014-12-24 LAB — PROTEIN ELECTROPHORESIS, SERUM
ALPHA-2-GLOBULIN: 0.7 g/dL (ref 0.5–0.9)
Abnormal Protein Band1: 0.8 g/dL
Albumin ELP: 3.7 g/dL — ABNORMAL LOW (ref 3.8–4.8)
Alpha-1-Globulin: 0.3 g/dL (ref 0.2–0.3)
BETA 2: 0.3 g/dL (ref 0.2–0.5)
Beta Globulin: 0.4 g/dL (ref 0.4–0.6)
GAMMA GLOBULIN: 1.3 g/dL (ref 0.8–1.7)
Total Protein, Serum Electrophoresis: 6.8 g/dL (ref 6.1–8.1)

## 2015-01-06 ENCOUNTER — Telehealth: Payer: Self-pay

## 2015-01-06 ENCOUNTER — Other Ambulatory Visit: Payer: Self-pay | Admitting: *Deleted

## 2015-01-06 DIAGNOSIS — C9000 Multiple myeloma not having achieved remission: Secondary | ICD-10-CM

## 2015-01-06 MED ORDER — LENALIDOMIDE 5 MG PO CAPS: 5.0000 mg | ORAL_CAPSULE | Freq: Every day | ORAL | Status: DC

## 2015-01-06 NOTE — Telephone Encounter (Signed)
Incoming fax requesting [employer] authorization number for revlimid. Forwarded to [facility] RN with Dr [name].

## 2015-01-06 NOTE — Telephone Encounter (Signed)
This medication was e-scribed to [facility].

## 2015-01-06 NOTE — Telephone Encounter (Signed)
TC from [place] @ [facility] requesting refill on patient's Revlimid. Please fax to [phone] today. Please put in parentheses "[name]" Chaney Maclaren so the data entry will be correct.

## 2015-01-08 ENCOUNTER — Encounter: Payer: Self-pay | Admitting: Oncology

## 2015-01-08 ENCOUNTER — Other Ambulatory Visit: Payer: Self-pay | Admitting: *Deleted

## 2015-01-19 ENCOUNTER — Other Ambulatory Visit (HOSPITAL_BASED_OUTPATIENT_CLINIC_OR_DEPARTMENT_OTHER): Payer: Commercial Managed Care - PPO

## 2015-01-19 DIAGNOSIS — C9000 Multiple myeloma not having achieved remission: Secondary | ICD-10-CM | POA: Diagnosis not present

## 2015-01-19 LAB — CBC WITH DIFFERENTIAL/PLATELET
BASO%: 0.6 % (ref 0.0–2.0)
BASOS ABS: 0 10*3/uL (ref 0.0–0.1)
EOS%: 11.6 % — ABNORMAL HIGH (ref 0.0–7.0)
Eosinophils Absolute: 0.3 10*3/uL (ref 0.0–0.5)
HCT: 34.1 % — ABNORMAL LOW (ref 38.4–49.9)
HGB: 11.1 g/dL — ABNORMAL LOW (ref 13.0–17.1)
LYMPH#: 0.7 10*3/uL — AB (ref 0.9–3.3)
LYMPH%: 28 % (ref 14.0–49.0)
MCH: 31.7 pg (ref 27.2–33.4)
MCHC: 32.5 g/dL (ref 32.0–36.0)
MCV: 97.6 fL (ref 79.3–98.0)
MONO#: 0.3 10*3/uL (ref 0.1–0.9)
MONO%: 12.8 % (ref 0.0–14.0)
NEUT#: 1.1 10*3/uL — ABNORMAL LOW (ref 1.5–6.5)
NEUT%: 47 % (ref 39.0–75.0)
PLATELETS: 121 10*3/uL — AB (ref 140–400)
RBC: 3.49 10*6/uL — ABNORMAL LOW (ref 4.20–5.82)
RDW: 16.7 % — ABNORMAL HIGH (ref 11.0–14.6)
WBC: 2.4 10*3/uL — ABNORMAL LOW (ref 4.0–10.3)

## 2015-01-20 LAB — PROTEIN / CREATININE RATIO, URINE
Creatinine, Urine: 105.1 mg/dL
Protein Creatinine Ratio: 0.13 (ref <0.15)
Total Protein, Urine: 14 mg/dL (ref 5–25)

## 2015-01-21 LAB — PROTEIN ELECTROPHORESIS, SERUM
ALBUMIN ELP: 3.7 g/dL — AB (ref 3.8–4.8)
ALPHA-1-GLOBULIN: 0.3 g/dL (ref 0.2–0.3)
Abnormal Protein Band1: 0.7 g/dL
Alpha-2-Globulin: 0.7 g/dL (ref 0.5–0.9)
BETA 2: 0.3 g/dL (ref 0.2–0.5)
Beta Globulin: 0.5 g/dL (ref 0.4–0.6)
Gamma Globulin: 1.3 g/dL (ref 0.8–1.7)
Total Protein, Serum Electrophoresis: 6.6 g/dL (ref 6.1–8.1)

## 2015-01-21 LAB — KAPPA/LAMBDA LIGHT CHAINS
Kappa free light chain: 3.94 mg/dL — ABNORMAL HIGH (ref 0.33–1.94)
Kappa:Lambda Ratio: 0.29 (ref 0.26–1.65)
Lambda Free Lght Chn: 13.4 mg/dL — ABNORMAL HIGH (ref 0.57–2.63)

## 2015-01-29 ENCOUNTER — Telehealth: Payer: Self-pay

## 2015-01-29 NOTE — Telephone Encounter (Signed)
revlimid refill request to Dr [name]

## 2015-02-01 ENCOUNTER — Telehealth: Payer: Self-pay | Admitting: *Deleted

## 2015-02-01 ENCOUNTER — Other Ambulatory Visit: Payer: Self-pay | Admitting: *Deleted

## 2015-02-01 DIAGNOSIS — C9000 Multiple myeloma not having achieved remission: Secondary | ICD-10-CM

## 2015-02-01 MED ORDER — LENALIDOMIDE 5 MG PO CAPS: 5.0000 mg | ORAL_CAPSULE | Freq: Every day | ORAL | Status: DC

## 2015-02-01 NOTE — Telephone Encounter (Signed)
Received call from [place] @ [facility] pharmacy re:  Pt will be out of town;  Pt requested refill of Revlimid before he leaves.  Message sent to Dr. [name] , and Tivis Ringer, desk nurse today. [facility]  Phone     [phone]  [facility]

## 2015-02-02 NOTE — Telephone Encounter (Signed)
Prescription sent- thank you - Val

## 2015-02-02 NOTE — Telephone Encounter (Signed)
I believe this haas been taken care of  Triage: Please make sure to copy my nurse with all med requests you cannot handle yourself  Thanks!

## 2015-02-09 ENCOUNTER — Encounter: Payer: Self-pay | Admitting: Nurse Practitioner

## 2015-02-09 ENCOUNTER — Encounter: Payer: Self-pay | Admitting: Oncology

## 2015-02-11 ENCOUNTER — Other Ambulatory Visit: Payer: Self-pay | Admitting: Oncology

## 2015-02-16 ENCOUNTER — Other Ambulatory Visit (HOSPITAL_BASED_OUTPATIENT_CLINIC_OR_DEPARTMENT_OTHER): Payer: Commercial Managed Care - PPO

## 2015-02-16 DIAGNOSIS — C9000 Multiple myeloma not having achieved remission: Secondary | ICD-10-CM

## 2015-02-16 LAB — PROTEIN / CREATININE RATIO, URINE
CREATININE, URINE: 81.5 mg/dL
Protein Creatinine Ratio: 0.18 — ABNORMAL HIGH (ref <0.15)
Total Protein, Urine: 15 mg/dL (ref 5–25)

## 2015-02-18 LAB — PROTEIN ELECTROPHORESIS, SERUM
Abnormal Protein Band1: 0.8 g/dL
Albumin ELP: 3.8 g/dL (ref 3.8–4.8)
Alpha-1-Globulin: 0.3 g/dL (ref 0.2–0.3)
Alpha-2-Globulin: 0.8 g/dL (ref 0.5–0.9)
BETA GLOBULIN: 0.5 g/dL (ref 0.4–0.6)
Beta 2: 0.3 g/dL (ref 0.2–0.5)
Gamma Globulin: 1.4 g/dL (ref 0.8–1.7)
TOTAL PROTEIN, SERUM ELECTROPHOR: 7.1 g/dL (ref 6.1–8.1)

## 2015-02-18 LAB — KAPPA/LAMBDA LIGHT CHAINS
Kappa free light chain: 4.07 mg/dL — ABNORMAL HIGH (ref 0.33–1.94)
Kappa:Lambda Ratio: 0.32 (ref 0.26–1.65)
Lambda Free Lght Chn: 12.7 mg/dL — ABNORMAL HIGH (ref 0.57–2.63)

## 2015-02-22 ENCOUNTER — Ambulatory Visit (HOSPITAL_BASED_OUTPATIENT_CLINIC_OR_DEPARTMENT_OTHER): Payer: Commercial Managed Care - PPO | Admitting: Oncology

## 2015-02-22 ENCOUNTER — Ambulatory Visit: Payer: Commercial Managed Care - PPO | Admitting: Oncology

## 2015-02-22 ENCOUNTER — Ambulatory Visit (HOSPITAL_BASED_OUTPATIENT_CLINIC_OR_DEPARTMENT_OTHER): Payer: Commercial Managed Care - PPO

## 2015-02-22 ENCOUNTER — Telehealth: Payer: Self-pay | Admitting: Oncology

## 2015-02-22 VITALS — BP 131/73 | HR 66 | Temp 97.6°F | Resp 18 | Ht 70.0 in | Wt 182.4 lb

## 2015-02-22 DIAGNOSIS — C9000 Multiple myeloma not having achieved remission: Secondary | ICD-10-CM

## 2015-02-22 DIAGNOSIS — I10 Essential (primary) hypertension: Secondary | ICD-10-CM | POA: Diagnosis not present

## 2015-02-22 DIAGNOSIS — C9001 Multiple myeloma in remission: Secondary | ICD-10-CM | POA: Diagnosis not present

## 2015-02-22 LAB — CBC WITH DIFFERENTIAL/PLATELET
BASO%: 0.8 % (ref 0.0–2.0)
BASOS ABS: 0 10*3/uL (ref 0.0–0.1)
EOS%: 3 % (ref 0.0–7.0)
Eosinophils Absolute: 0.1 10*3/uL (ref 0.0–0.5)
HEMATOCRIT: 32.8 % — AB (ref 38.4–49.9)
HGB: 11.3 g/dL — ABNORMAL LOW (ref 13.0–17.1)
LYMPH%: 36.5 % (ref 14.0–49.0)
MCH: 32.7 pg (ref 27.2–33.4)
MCHC: 34.5 g/dL (ref 32.0–36.0)
MCV: 94.8 fL (ref 79.3–98.0)
MONO#: 0.7 10*3/uL (ref 0.1–0.9)
MONO%: 19.5 % — ABNORMAL HIGH (ref 0.0–14.0)
NEUT#: 1.5 10*3/uL (ref 1.5–6.5)
NEUT%: 40.2 % (ref 39.0–75.0)
NRBC: 0 % (ref 0–0)
PLATELETS: 110 10*3/uL — AB (ref 140–400)
RBC: 3.46 10*6/uL — ABNORMAL LOW (ref 4.20–5.82)
RDW: 16 % — ABNORMAL HIGH (ref 11.0–14.6)
WBC: 3.6 10*3/uL — ABNORMAL LOW (ref 4.0–10.3)
lymph#: 1.3 10*3/uL (ref 0.9–3.3)

## 2015-02-22 NOTE — Progress Notes (Signed)
ID: DONATELLO KLEVE   DOB: [birth year]  MR#: [id number]  TKP#:[id number]   PCP: [name] SU: OTHER MD: [name], [name], [address] [place] [phone]), Ardine Bjork  CHIEF COMPLAINT: Multiple myeloma  CURRENT TREATMENT: Lenalidomide maintenance   HISTORY OF PRESENT ILLNESS: From the original intake nodes:  The patient was worked up for peptic ulcer disease in August of 2011, with significant bleeding and anemia.  The patient was Helicobacter pylori negative.  He received some epinephrine when he had his EGD and then started on Protonix.  [name] anemia slowly resolved so that by September, his hemoglobin was up to 10.9 and by earlier this month, his hemoglobin was up to 12.5.    As part of his general workup, he was found to have a slightly elevated globulin fraction.  In September, his total protein was 8.3 with an albumin of 3.8.  In January, the total protein was 8.4 and albumin 3.6.  With persistence of this slight abnormality, Dr. Olevia Perches obtained serum immunofixation and SPEP.  The SPTP showed an M-spike of 2.67 grams.  A total IgG was 4,190.  Total IgA low at 28.  Total IgM low at 28 also.  The immunofixation showed a monoclonal IgG lambda paraprotein.  There were also monoclonal free lambda light chains present. With this information, the patient was referred for further evaluation.   A diagnosis of myeloma was confirmed by bone marrow biopsy and subsequebnt treatment is as detailed below  INTERVAL HISTORY: [name] returns today for followup of his multiple myeloma. He continues on very low-dose lenalidomide maintenance, 5 mg daily 7 days on and 7 days off. He is just starting his week" on" today. -- On the current dose C doesn't feel particularly fatigued. He continues to have constipation and as soon as he starts the lenalidomide he starts the stool softeners. Unfortunately he has never been able to get his bowels regulated. There are  either to lose or too hard. Usually has 2 or 3 bowel movements during the lenalidomide week and 1-2 on the "off" week. He is not having new problems with neuropathy or daytime somnolence. He obtains a drug in approximately $100 a month  REVIEW OF SYSTEMS: [name] just travel to [place] for his wife's daughter's graduation and on the way picked up a "head cold". He had a severe headache, cough and phlegm, but no fever. He still has a little bit of a dry cough. He is not exercising regularly currently because these just getting over that viral illness. He is having pain at the base of his left thumb and so Ardine Bjork who gave him a shot and put him on a splint. That worked well. Sometimes [name] feels a little bit the Z or "goofy", and finds it hard to concentrate. This is not persistent however. Currently he is not hydrating himself as aggressively as he used to. Sometimes has trouble swallowing. A detailed review of systems today was otherwise stable   PAST MEDICAL HISTORY: Past Medical History  Diagnosis Date  . Hypertension   . Hyperlipidemia   . Duodenal ulcer   . Multiple myeloma 07/04/2011  . Allergy   . Thyroid disease   . GERD (gastroesophageal reflux disease)   . Depression   . Anemia   Significant for peptic ulcer disease as noted above.  History of hyperlipidemia.  History of anxiety and depression.  History of GERD which is significantly improved with weight loss and history of reactive airway disease,  possibly secondary to the GERD which also improved with weight loss.  There was a history of obesity, now much improved secondary to exercise and diet.   PAST SURGICAL HISTORY: Past Surgical History  Procedure Laterality Date  . Cardiolite study  11/25/2003    NORMAL  . Bone marrow transplant  2011    for MM  . Tonsillectomy      FAMILY HISTORY Family History  Problem Relation Age of Onset  . Throat cancer Mother   . Hypertension Father   . Stroke Father   . Asthma Father   .  Diabetes Father   . Pancreatic cancer Brother   [name] father died at the age of 63 following a stroke.  [name] mother died with cancer of the throat at the age of 37.   She had a history of depression and was a smoker.  Not clear how much alcohol she drank, according to the patient.  The patient had two brothers.  One died from the age of 2 from a "kidney problem."  The second one died at the age of 42 from pancreatic cancer. (This may have been duodenal cancer. 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  His wife of 37+ years was [name], but they are now divorced. In 2016 he remarried, his current wife's name is [name].  The patient has two daughters:  [name], who works in [place], who is expecting her second child April 2016. .  They both live here in [place]. 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  The patient is very close to his daughters.  Lobue first daughter, [name], born 03/06/2013, apparently has Weber/Osler/Rendu. The patient is not a church attender.  He does derive a great deal of support from friends at the "[employer]" which he attends very regularly, and also participates in Al-Anon even though actually there is no alcohol or drug problem in the family.  He participates in this group and he gets quite a bit of support from it.    ADVANCED DIRECTIVES: In place  HEALTH MAINTENANCE: (updated 07/16/2013) History  Substance Use Topics  . Smoking status: Former Smoker -- 3 years    Quit date: 03/27/1969  . Smokeless tobacco: Never Used  . Alcohol Use: No     Colonoscopy:  PSA:  Sept 2014, normal/Dr. [name]  Lipid panel: Sept 2014/Dr. [name]   Allergies  Allergen Reactions  . Crestor [Rosuvastatin Calcium]     ADVERSE EFFECTS ON LIVER   . Lipitor [Atorvastatin Calcium]     Current Outpatient Prescriptions  Medication Sig Dispense Refill  . aspirin EC 81 MG tablet Take 81 mg by mouth every morning.    . Bisacodyl (DULCOLAX PO) Take 1 tablet by mouth.    . cetirizine (ZYRTEC) 10 MG tablet Take 10 mg by mouth daily.    . cholecalciferol (VITAMIN D) 1000 UNITS tablet take 1 tablet by mouth once daily 100 tablet 3  . escitalopram (LEXAPRO) 10 MG tablet Take 1 tablet (10 mg total) by mouth daily. 30 tablet 5  . ibuprofen (ADVIL,MOTRIN) 200 MG tablet Take 400 mg by mouth every 8 (eight) hours as needed for pain.     [name] lenalidomide (REVLIMID) 5 MG capsule Take 1 capsule (5 mg total) by mouth daily.  7 days on, 7 days off, then repeat 14 capsule 0  . levothyroxine (SYNTHROID, LEVOTHROID) 150 MCG tablet Take 1 tablet (150 mcg total) by mouth daily before breakfast. 90 tablet 2  . omeprazole (PRILOSEC) 40 MG capsule Take 1 capsule (40 mg total) by mouth every morning. 90 capsule 3  . polyethylene glycol (MIRALAX / GLYCOLAX) packet Take 17 g by mouth as needed.     . sildenafil (VIAGRA) 100 MG tablet Take 1 tablet (100 mg total) by mouth as needed for erectile dysfunction. 12 tablet 11  . [DISCONTINUED] fexofenadine (ALLEGRA) 60 MG tablet Take 180 mg by mouth daily.       No current facility-administered medications for this visit.    OBJECTIVE: Middle-aged white man who appears well Filed Vitals:   02/22/15 1132  BP: 131/73  Pulse: 66  Temp: 97.6 F (36.4 C)  Resp: 18   Body mass index is 26.17 kg/(m^2).  Sclerae unicteric, EOMs intact Oropharynx clear, dentition in good repair No cervical or supraclavicular adenopathy Lungs no rales or rhonchi Heart regular rate and rhythm Abd soft, nontender, positive bowel sounds MSK no focal spinal tenderness, no upper extremity lymphedema Neuro: nonfocal, well oriented, appropriate affect   LAB RESULTS:  Lab Results  Component Value Date   WBC 2.4* 01/19/2015   NEUTROABS  1.1* 01/19/2015   HGB 11.1* 01/19/2015   HCT 34.1* 01/19/2015   MCV 97.6 01/19/2015   PLT 121* 01/19/2015      Chemistry      Component Value Date/Time   NA 136 12/22/2014 0858   NA 138 10/23/2014 0842   K 4.1 12/22/2014 0858   K 4.1 10/23/2014 0842   CL 108 10/23/2014 0842   CL 107 03/10/2013 1352   CO2 20* 12/22/2014 0858   CO2 24 10/23/2014 0842   BUN 24.7 12/22/2014 0858   BUN 19 10/23/2014 0842   CREATININE 1.5* 12/22/2014 0858   CREATININE 1.22 10/23/2014 0842      Component Value Date/Time   CALCIUM 9.2 12/22/2014 0858   CALCIUM 9.1 10/23/2014 0842   ALKPHOS 78 12/22/2014 0858   ALKPHOS 75 09/16/2014 0750   AST 24 12/22/2014 0858   AST 21 09/16/2014 0750   ALT 28 12/22/2014 0858   ALT 16 09/16/2014 0750   BILITOT 0.44 12/22/2014 0858   BILITOT 0.6 09/16/2014 0750    Results for INOCENCIO, ROY (MRN [id number]) as of 02/22/2015 11:35  Ref. Range 10/27/2014 08:31 11/24/2014 07:55 12/22/2014 08:59 01/19/2015 08:13 02/16/2015 09:03  Kappa:Lambda Ratio Latest Ref Range: 0.26-1.65  0.66 0.39 0.33 0.29 0.32   Abnormal Protein Band1 g/dL 0.8 0.7 0.8         STUDIES: No results found.  ASSESSMENT: 64 y.o.  [place] man with a history of multiple myeloma diagnosed February of 2012, with an initial M-spike of 2.6 g/dL, IFE showing an IgG lambda paraprotein and free lambda light chains in the urine. Cytogenetics showed trisomy 63. Bone marrow biopsy showed a 22% plasmacytosis. Treated with   (1) bortezomib (subcutaneously), lenalidomide, and dexamethasone, with repeat bone marrow biopsy May of 2012 showing 10% plasmacytosis   (2) high-dose chemotherapy with BCNU and melphalan at [facility], followed by stem cell rescue July of 2012   (3) on zoledronic acid started December of 2012, initially monthly, currently given every 3 months, most recent dose 02/17/2014  (4) low-dose lenalidomide resumed April 2013, interrupted several times since.  Resumed again on 02/19/2013, and a dose of  5 mg daily, 21 days  on and 7 days off.  (5) CNS symptoms and abnormal brain MRI extensively evaluated by neurology with no definitive diagnosis established; improving   (6) rising M spike noted June 2014 but did not meet criteria for carfilzomib study  (7) on lenalidomide 25 mg daily, 14 days on, 7 days off, starting 04/18/2013, interrupted December 2014 because of rash;   (a) resumed January 2015 at 10 mg/ day at 21 days on/ 7 days off  ([place]) starting 08/18/2014 decreasedto 10 mg/ day 14 days on, 7 days off because of cytopenias  (c) as of February 2016 he has been on 5 mg daily 7 days on 7 days off   PLAN:  [name] continues in remission as far as his myeloma is concerned. Even at this lower dose, though, his counts are marginal. I think it would be useful before the end of the year to repeat a bone marrow biopsy, and we will likely do that the first week in December so we will have all the results ready by the time he sees Dr. Alvie Heidelberg HTN January 2017. We will also obtain a bone survey around that time.  Otherwise I am not making any changes in his treatment protocol. We will obtain a CBC today, then again June 28 with full labs, then monthly, with a visit in September and then again the second week in December.  He has a good understanding of the overall plan. He knows a goal of treatment is control. He will call with any problems that may develop before his next visit here. [name]  Chauncey Cruel, MD     02/22/2015

## 2015-02-22 NOTE — Telephone Encounter (Signed)
Appointments made and avs printed for patient °

## 2015-02-24 ENCOUNTER — Encounter: Payer: Self-pay | Admitting: Oncology

## 2015-02-24 ENCOUNTER — Other Ambulatory Visit: Payer: Self-pay | Admitting: Oncology

## 2015-03-03 ENCOUNTER — Other Ambulatory Visit: Payer: Self-pay | Admitting: *Deleted

## 2015-03-03 DIAGNOSIS — C9000 Multiple myeloma not having achieved remission: Secondary | ICD-10-CM

## 2015-03-03 MED ORDER — LENALIDOMIDE 5 MG PO CAPS: 5.0000 mg | ORAL_CAPSULE | Freq: Every day | ORAL | Status: DC

## 2015-03-09 ENCOUNTER — Other Ambulatory Visit: Payer: Self-pay | Admitting: *Deleted

## 2015-03-09 MED ORDER — VITAMIN D 1000 UNITS PO TABS: 1000.0000 [IU] | ORAL_TABLET | Freq: Every day | ORAL | Status: DC

## 2015-03-16 ENCOUNTER — Other Ambulatory Visit (HOSPITAL_BASED_OUTPATIENT_CLINIC_OR_DEPARTMENT_OTHER): Payer: Commercial Managed Care - PPO

## 2015-03-16 DIAGNOSIS — C9000 Multiple myeloma not having achieved remission: Secondary | ICD-10-CM

## 2015-03-16 LAB — CBC WITH DIFFERENTIAL/PLATELET
BASO%: 0.7 % (ref 0.0–2.0)
Basophils Absolute: 0 10*3/uL (ref 0.0–0.1)
EOS%: 7.9 % — AB (ref 0.0–7.0)
Eosinophils Absolute: 0.2 10*3/uL (ref 0.0–0.5)
HEMATOCRIT: 34 % — AB (ref 38.4–49.9)
HGB: 11.4 g/dL — ABNORMAL LOW (ref 13.0–17.1)
LYMPH#: 0.7 10*3/uL — AB (ref 0.9–3.3)
LYMPH%: 25.6 % (ref 14.0–49.0)
MCH: 32.2 pg (ref 27.2–33.4)
MCHC: 33.4 g/dL (ref 32.0–36.0)
MCV: 96.3 fL (ref 79.3–98.0)
MONO#: 0.3 10*3/uL (ref 0.1–0.9)
MONO%: 11.4 % (ref 0.0–14.0)
NEUT#: 1.5 10*3/uL (ref 1.5–6.5)
NEUT%: 54.4 % (ref 39.0–75.0)
Platelets: 141 10*3/uL (ref 140–400)
RBC: 3.54 10*6/uL — AB (ref 4.20–5.82)
RDW: 16.6 % — ABNORMAL HIGH (ref 11.0–14.6)
WBC: 2.8 10*3/uL — AB (ref 4.0–10.3)

## 2015-03-16 LAB — COMPREHENSIVE METABOLIC PANEL (CC13)
ALT: 26 U/L (ref 0–55)
AST: 22 U/L (ref 5–34)
Albumin: 3.3 g/dL — ABNORMAL LOW (ref 3.5–5.0)
Alkaline Phosphatase: 84 U/L (ref 40–150)
Anion Gap: 6 mEq/L (ref 3–11)
BUN: 22.3 mg/dL (ref 7.0–26.0)
CO2: 24 mEq/L (ref 22–29)
CREATININE: 1.2 mg/dL (ref 0.7–1.3)
Calcium: 9.5 mg/dL (ref 8.4–10.4)
Chloride: 108 mEq/L (ref 98–109)
EGFR: 64 mL/min/{1.73_m2} — ABNORMAL LOW (ref >90)
Glucose: 96 mg/dl (ref 70–140)
POTASSIUM: 3.9 meq/L (ref 3.5–5.1)
Sodium: 138 mEq/L (ref 136–145)
Total Bilirubin: 0.42 mg/dL (ref 0.20–1.20)
Total Protein: 6.9 g/dL (ref 6.4–8.3)

## 2015-03-16 LAB — PROTEIN / CREATININE RATIO, URINE
Creatinine, Urine: 127.4 mg/dL
Protein Creatinine Ratio: 0.16 — ABNORMAL HIGH (ref <0.15)
Total Protein, Urine: 21 mg/dL (ref 5–25)

## 2015-03-18 LAB — PROTEIN ELECTROPHORESIS, SERUM
ABNORMAL PROTEIN BAND1: 0.9 g/dL
ALPHA-1-GLOBULIN: 0.3 g/dL (ref 0.2–0.3)
Albumin ELP: 3.5 g/dL — ABNORMAL LOW (ref 3.8–4.8)
Alpha-2-Globulin: 0.8 g/dL (ref 0.5–0.9)
BETA 2: 0.3 g/dL (ref 0.2–0.5)
BETA GLOBULIN: 0.5 g/dL (ref 0.4–0.6)
Gamma Globulin: 1.4 g/dL (ref 0.8–1.7)
Total Protein, Serum Electrophoresis: 6.7 g/dL (ref 6.1–8.1)

## 2015-03-18 LAB — KAPPA/LAMBDA LIGHT CHAINS
Kappa free light chain: 4.3 mg/dL — ABNORMAL HIGH (ref 0.33–1.94)
Kappa:Lambda Ratio: 0.3 (ref 0.26–1.65)
LAMBDA FREE LGHT CHN: 14.3 mg/dL — AB (ref 0.57–2.63)

## 2015-03-29 ENCOUNTER — Other Ambulatory Visit: Payer: Self-pay

## 2015-03-29 ENCOUNTER — Other Ambulatory Visit: Payer: Self-pay | Admitting: Internal Medicine

## 2015-03-29 DIAGNOSIS — C9000 Multiple myeloma not having achieved remission: Secondary | ICD-10-CM

## 2015-03-29 MED ORDER — LENALIDOMIDE 5 MG PO CAPS: 5.0000 mg | ORAL_CAPSULE | Freq: Every day | ORAL | Status: DC

## 2015-03-31 ENCOUNTER — Other Ambulatory Visit: Payer: Self-pay

## 2015-03-31 DIAGNOSIS — C9000 Multiple myeloma not having achieved remission: Secondary | ICD-10-CM

## 2015-03-31 MED ORDER — LENALIDOMIDE 5 MG PO CAPS: 5.0000 mg | ORAL_CAPSULE | Freq: Every day | ORAL | Status: DC

## 2015-04-13 ENCOUNTER — Other Ambulatory Visit (HOSPITAL_BASED_OUTPATIENT_CLINIC_OR_DEPARTMENT_OTHER): Payer: Commercial Managed Care - PPO

## 2015-04-13 DIAGNOSIS — C9000 Multiple myeloma not having achieved remission: Secondary | ICD-10-CM

## 2015-04-13 LAB — COMPREHENSIVE METABOLIC PANEL (CC13)
ALBUMIN: 3.5 g/dL (ref 3.5–5.0)
ALT: 24 U/L (ref 0–55)
AST: 24 U/L (ref 5–34)
Alkaline Phosphatase: 90 U/L (ref 40–150)
Anion Gap: 7 mEq/L (ref 3–11)
BUN: 19.1 mg/dL (ref 7.0–26.0)
CALCIUM: 9.5 mg/dL (ref 8.4–10.4)
CO2: 22 mEq/L (ref 22–29)
CREATININE: 1.3 mg/dL (ref 0.7–1.3)
Chloride: 107 mEq/L (ref 98–109)
EGFR: 60 mL/min/{1.73_m2} — ABNORMAL LOW (ref >90)
GLUCOSE: 101 mg/dL (ref 70–140)
Potassium: 4.4 mEq/L (ref 3.5–5.1)
Sodium: 137 mEq/L (ref 136–145)
TOTAL PROTEIN: 7.2 g/dL (ref 6.4–8.3)
Total Bilirubin: 0.47 mg/dL (ref 0.20–1.20)

## 2015-04-13 LAB — CBC WITH DIFFERENTIAL/PLATELET
BASO%: 1.3 % (ref 0.0–2.0)
Basophils Absolute: 0 10*3/uL (ref 0.0–0.1)
EOS%: 8.6 % — AB (ref 0.0–7.0)
Eosinophils Absolute: 0.2 10*3/uL (ref 0.0–0.5)
HCT: 34.2 % — ABNORMAL LOW (ref 38.4–49.9)
HGB: 11.5 g/dL — ABNORMAL LOW (ref 13.0–17.1)
LYMPH%: 27 % (ref 14.0–49.0)
MCH: 31.8 pg (ref 27.2–33.4)
MCHC: 33.6 g/dL (ref 32.0–36.0)
MCV: 94.5 fL (ref 79.3–98.0)
MONO#: 0.3 10*3/uL (ref 0.1–0.9)
MONO%: 11.7 % (ref 0.0–14.0)
NEUT#: 1.3 10*3/uL — ABNORMAL LOW (ref 1.5–6.5)
NEUT%: 51.4 % (ref 39.0–75.0)
Platelets: 130 10*3/uL — ABNORMAL LOW (ref 140–400)
RBC: 3.62 10*6/uL — ABNORMAL LOW (ref 4.20–5.82)
RDW: 16.1 % — AB (ref 11.0–14.6)
WBC: 2.5 10*3/uL — ABNORMAL LOW (ref 4.0–10.3)
lymph#: 0.7 10*3/uL — ABNORMAL LOW (ref 0.9–3.3)

## 2015-04-13 LAB — PROTEIN / CREATININE RATIO, URINE
Creatinine, Urine: 25.8 mg/dL
PROTEIN CREATININE RATIO: 0.19 — AB (ref <0.15)
TOTAL PROTEIN, URINE: 5 mg/dL (ref 5–25)

## 2015-04-16 LAB — KAPPA/LAMBDA LIGHT CHAINS
Kappa free light chain: 3.87 mg/dL — ABNORMAL HIGH (ref 0.33–1.94)
Kappa:Lambda Ratio: 0.23 — ABNORMAL LOW (ref 0.26–1.65)
LAMBDA FREE LGHT CHN: 17.2 mg/dL — AB (ref 0.57–2.63)

## 2015-04-16 LAB — PROTEIN ELECTROPHORESIS, SERUM
ALBUMIN ELP: 3.8 g/dL (ref 3.8–4.8)
Abnormal Protein Band1: 1 g/dL
Alpha-1-Globulin: 0.3 g/dL (ref 0.2–0.3)
Alpha-2-Globulin: 0.8 g/dL (ref 0.5–0.9)
BETA GLOBULIN: 0.5 g/dL (ref 0.4–0.6)
Beta 2: 0.3 g/dL (ref 0.2–0.5)
Gamma Globulin: 1.5 g/dL (ref 0.8–1.7)
TOTAL PROTEIN, SERUM ELECTROPHOR: 7.1 g/dL (ref 6.1–8.1)

## 2015-04-28 ENCOUNTER — Telehealth: Payer: Self-pay | Admitting: *Deleted

## 2015-04-28 NOTE — Telephone Encounter (Signed)
TC from patient requesting refill on his Revilmid from [facility].

## 2015-04-30 ENCOUNTER — Telehealth: Payer: Self-pay

## 2015-04-30 ENCOUNTER — Other Ambulatory Visit: Payer: Self-pay | Admitting: *Deleted

## 2015-04-30 DIAGNOSIS — C9000 Multiple myeloma not having achieved remission: Secondary | ICD-10-CM

## 2015-04-30 MED ORDER — LENALIDOMIDE 5 MG PO CAPS: 5.0000 mg | ORAL_CAPSULE | Freq: Every day | ORAL | Status: DC

## 2015-04-30 NOTE — Telephone Encounter (Signed)
Rob from [facility] called requesting refill on pt's revlimid. Pt was using [facility] which is now with [facility]. The [facility] phone# is [phone]. The [facility] fax# is [phone]. Please send new Rx.

## 2015-05-11 ENCOUNTER — Other Ambulatory Visit (HOSPITAL_BASED_OUTPATIENT_CLINIC_OR_DEPARTMENT_OTHER): Payer: Commercial Managed Care - PPO

## 2015-05-11 DIAGNOSIS — C9000 Multiple myeloma not having achieved remission: Secondary | ICD-10-CM

## 2015-05-11 LAB — CBC WITH DIFFERENTIAL/PLATELET
BASO%: 0.7 % (ref 0.0–2.0)
BASOS ABS: 0 10*3/uL (ref 0.0–0.1)
EOS ABS: 0.2 10*3/uL (ref 0.0–0.5)
EOS%: 5.9 % (ref 0.0–7.0)
HCT: 35.2 % — ABNORMAL LOW (ref 38.4–49.9)
HGB: 11.7 g/dL — ABNORMAL LOW (ref 13.0–17.1)
LYMPH%: 20.5 % (ref 14.0–49.0)
MCH: 31.6 pg (ref 27.2–33.4)
MCHC: 33.3 g/dL (ref 32.0–36.0)
MCV: 94.8 fL (ref 79.3–98.0)
MONO#: 0.3 10*3/uL (ref 0.1–0.9)
MONO%: 9.7 % (ref 0.0–14.0)
NEUT%: 63.2 % (ref 39.0–75.0)
NEUTROS ABS: 2.1 10*3/uL (ref 1.5–6.5)
PLATELETS: 130 10*3/uL — AB (ref 140–400)
RBC: 3.71 10*6/uL — AB (ref 4.20–5.82)
RDW: 16.9 % — ABNORMAL HIGH (ref 11.0–14.6)
WBC: 3.4 10*3/uL — AB (ref 4.0–10.3)
lymph#: 0.7 10*3/uL — ABNORMAL LOW (ref 0.9–3.3)

## 2015-05-11 LAB — COMPREHENSIVE METABOLIC PANEL (CC13)
ALBUMIN: 3.3 g/dL — AB (ref 3.5–5.0)
ALK PHOS: 86 U/L (ref 40–150)
ALT: 28 U/L (ref 0–55)
ANION GAP: 8 meq/L (ref 3–11)
AST: 22 U/L (ref 5–34)
BILIRUBIN TOTAL: 0.38 mg/dL (ref 0.20–1.20)
BUN: 18.2 mg/dL (ref 7.0–26.0)
CO2: 22 meq/L (ref 22–29)
Calcium: 9.3 mg/dL (ref 8.4–10.4)
Chloride: 107 mEq/L (ref 98–109)
Creatinine: 1.3 mg/dL (ref 0.7–1.3)
EGFR: 59 mL/min/{1.73_m2} — AB (ref >90)
Glucose: 88 mg/dl (ref 70–140)
Potassium: 4 mEq/L (ref 3.5–5.1)
Sodium: 137 mEq/L (ref 136–145)
TOTAL PROTEIN: 6.9 g/dL (ref 6.4–8.3)

## 2015-05-11 LAB — PROTEIN / CREATININE RATIO, URINE
Creatinine, Urine: 28 mg/dL
Total Protein, Urine: <4 mg/dL — ABNORMAL LOW (ref 5–25)

## 2015-05-13 LAB — PROTEIN ELECTROPHORESIS, SERUM
ALBUMIN ELP: 3.7 g/dL — AB (ref 3.8–4.8)
ALPHA-1-GLOBULIN: 0.3 g/dL (ref 0.2–0.3)
ALPHA-2-GLOBULIN: 0.8 g/dL (ref 0.5–0.9)
Abnormal Protein Band1: 0.9 g/dL
Beta 2: 0.4 g/dL (ref 0.2–0.5)
Beta Globulin: 0.5 g/dL (ref 0.4–0.6)
GAMMA GLOBULIN: 1.4 g/dL (ref 0.8–1.7)
Total Protein, Serum Electrophoresis: 7 g/dL (ref 6.1–8.1)

## 2015-05-13 LAB — KAPPA/LAMBDA LIGHT CHAINS
KAPPA FREE LGHT CHN: 3.73 mg/dL — AB (ref 0.33–1.94)
KAPPA LAMBDA RATIO: 0.26 (ref 0.26–1.65)
LAMBDA FREE LGHT CHN: 14.4 mg/dL — AB (ref 0.57–2.63)

## 2015-05-17 ENCOUNTER — Encounter: Payer: Self-pay | Admitting: Nurse Practitioner

## 2015-05-25 ENCOUNTER — Other Ambulatory Visit: Payer: Self-pay | Admitting: *Deleted

## 2015-05-25 DIAGNOSIS — C9000 Multiple myeloma not having achieved remission: Secondary | ICD-10-CM

## 2015-05-25 MED ORDER — LENALIDOMIDE 5 MG PO CAPS: 5.0000 mg | ORAL_CAPSULE | Freq: Every day | ORAL | Status: DC

## 2015-05-25 NOTE — Telephone Encounter (Signed)
Patient called requesting Revlimid refill he is to restart 05-31-2015 from [facility].     Patient instructed to take patient survey.  [facility] number provided.

## 2015-05-29 ENCOUNTER — Emergency Department (HOSPITAL_COMMUNITY): Payer: Commercial Managed Care - PPO

## 2015-05-29 ENCOUNTER — Encounter (HOSPITAL_COMMUNITY): Payer: Self-pay | Admitting: Emergency Medicine

## 2015-05-29 ENCOUNTER — Emergency Department (HOSPITAL_COMMUNITY)
Admission: EM | Admit: 2015-05-29 | Discharge: 2015-05-29 | Disposition: A | Discharge status: Home / Self Care | Payer: Commercial Managed Care - PPO | Attending: Emergency Medicine | Admitting: Emergency Medicine

## 2015-05-29 DIAGNOSIS — Z79899 Other long term (current) drug therapy: Secondary | ICD-10-CM | POA: Insufficient documentation

## 2015-05-29 DIAGNOSIS — Z7982 Long term (current) use of aspirin: Secondary | ICD-10-CM | POA: Insufficient documentation

## 2015-05-29 DIAGNOSIS — K219 Gastro-esophageal reflux disease without esophagitis: Secondary | ICD-10-CM | POA: Insufficient documentation

## 2015-05-29 DIAGNOSIS — I10 Essential (primary) hypertension: Secondary | ICD-10-CM | POA: Insufficient documentation

## 2015-05-29 DIAGNOSIS — Z8579 Personal history of other malignant neoplasms of lymphoid, hematopoietic and related tissues: Secondary | ICD-10-CM | POA: Insufficient documentation

## 2015-05-29 DIAGNOSIS — Z862 Personal history of diseases of the blood and blood-forming organs and certain disorders involving the immune mechanism: Secondary | ICD-10-CM | POA: Diagnosis not present

## 2015-05-29 DIAGNOSIS — Z8739 Personal history of other diseases of the musculoskeletal system and connective tissue: Secondary | ICD-10-CM | POA: Insufficient documentation

## 2015-05-29 DIAGNOSIS — R4182 Altered mental status, unspecified: Secondary | ICD-10-CM | POA: Insufficient documentation

## 2015-05-29 DIAGNOSIS — E079 Disorder of thyroid, unspecified: Secondary | ICD-10-CM | POA: Insufficient documentation

## 2015-05-29 LAB — COMPREHENSIVE METABOLIC PANEL
ALT: 24 U/L (ref 17–63)
ANION GAP: 11 (ref 5–15)
AST: 31 U/L (ref 15–41)
Albumin: 3.7 g/dL (ref 3.5–5.0)
Alkaline Phosphatase: 85 U/L (ref 38–126)
BILIRUBIN TOTAL: 0.5 mg/dL (ref 0.3–1.2)
BUN: 20 mg/dL (ref 6–20)
CO2: 22 mmol/L (ref 22–32)
Calcium: 9.6 mg/dL (ref 8.9–10.3)
Chloride: 104 mmol/L (ref 101–111)
Creatinine, Ser: 1.32 mg/dL — ABNORMAL HIGH (ref 0.61–1.24)
GFR, EST NON AFRICAN AMERICAN: 55 mL/min — AB (ref >60)
Glucose, Bld: 95 mg/dL (ref 65–99)
POTASSIUM: 5 mmol/L (ref 3.5–5.1)
Sodium: 137 mmol/L (ref 135–145)
TOTAL PROTEIN: 7.2 g/dL (ref 6.5–8.1)

## 2015-05-29 LAB — URINALYSIS, ROUTINE W REFLEX MICROSCOPIC
BILIRUBIN URINE: NEGATIVE
Glucose, UA: NEGATIVE mg/dL
Ketones, ur: NEGATIVE mg/dL
LEUKOCYTES UA: NEGATIVE
NITRITE: NEGATIVE
Protein, ur: NEGATIVE mg/dL
SPECIFIC GRAVITY, URINE: 1.009 (ref 1.005–1.030)
UROBILINOGEN UA: 0.2 mg/dL (ref 0.0–1.0)
pH: 7 (ref 5.0–8.0)

## 2015-05-29 LAB — RAPID URINE DRUG SCREEN, HOSP PERFORMED
Amphetamines: NOT DETECTED
Barbiturates: NOT DETECTED
Benzodiazepines: NOT DETECTED
COCAINE: NOT DETECTED
OPIATES: NOT DETECTED
TETRAHYDROCANNABINOL: NOT DETECTED

## 2015-05-29 LAB — CBC
HEMATOCRIT: 36.4 % — AB (ref 39.0–52.0)
HEMOGLOBIN: 12.1 g/dL — AB (ref 13.0–17.0)
MCH: 31.3 pg (ref 26.0–34.0)
MCHC: 33.2 g/dL (ref 30.0–36.0)
MCV: 94.1 fL (ref 78.0–100.0)
Platelets: 125 10*3/uL — ABNORMAL LOW (ref 150–400)
RBC: 3.87 MIL/uL — AB (ref 4.22–5.81)
RDW: 16 % — AB (ref 11.5–15.5)
WBC: 4.7 10*3/uL (ref 4.0–10.5)

## 2015-05-29 LAB — URINE MICROSCOPIC-ADD ON

## 2015-05-29 NOTE — ED Provider Notes (Signed)
2130 - MR findings reviewed and are negative for any acute changes. Findings discussed with the patient and wife at bedside who verbalize understanding. Wife states that patient is "night and day better", back to baseline. Wife reports that patient has had some similar episodes in the past. Patient stable for d/c at this time. Return precautions provided at discharge; VSS.   Filed Vitals:   05/29/15 1900 05/29/15 1915 05/29/15 1924 05/29/15 1930  BP: 125/73 125/48  123/72  Pulse: 54 71  60  Temp:   98.5 F (36.9 C)   TempSrc:      Resp: 16 16  16   SpO2: 100% 90%  97%   Results for orders placed or performed during the hospital encounter of 05/29/15  Comprehensive metabolic panel  Result Value Ref Range   Sodium 137 135 - 145 mmol/L   Potassium 5.0 3.5 - 5.1 mmol/L   Chloride 104 101 - 111 mmol/L   CO2 22 22 - 32 mmol/L   Glucose, Bld 95 65 - 99 mg/dL   BUN 20 6 - 20 mg/dL   Creatinine, Ser 1.32 (H) 0.61 - 1.24 mg/dL   Calcium 9.6 8.9 - 10.3 mg/dL   Total Protein 7.2 6.5 - 8.1 g/dL   Albumin 3.7 3.5 - 5.0 g/dL   AST 31 15 - 41 U/L   ALT 24 17 - 63 U/L   Alkaline Phosphatase 85 38 - 126 U/L   Total Bilirubin 0.5 0.3 - 1.2 mg/dL   GFR calc non Af Amer 55 (L) >60 mL/min   GFR calc Af Amer >60 >60 mL/min   Anion gap 11 5 - 15  CBC  Result Value Ref Range   WBC 4.7 4.0 - 10.5 K/uL   RBC 3.87 (L) 4.22 - 5.81 MIL/uL   Hemoglobin 12.1 (L) 13.0 - 17.0 g/dL   HCT 36.4 (L) 39.0 - 52.0 %   MCV 94.1 78.0 - 100.0 fL   MCH 31.3 26.0 - 34.0 pg   MCHC 33.2 30.0 - 36.0 g/dL   RDW 16.0 (H) 11.5 - 15.5 %   Platelets 125 (L) 150 - 400 K/uL  Urinalysis, Routine w reflex microscopic (not at [facility])  Result Value Ref Range   Color, Urine YELLOW YELLOW   APPearance CLEAR CLEAR   Specific Gravity, Urine 1.009 1.005 - 1.030   pH 7.0 5.0 - 8.0   Glucose, UA NEGATIVE NEGATIVE mg/dL   Hgb urine dipstick MODERATE (A) NEGATIVE   Bilirubin Urine NEGATIVE NEGATIVE   Ketones, ur NEGATIVE NEGATIVE  mg/dL   Protein, ur NEGATIVE NEGATIVE mg/dL   Urobilinogen, UA 0.2 0.0 - 1.0 mg/dL   Nitrite NEGATIVE NEGATIVE   Leukocytes, UA NEGATIVE NEGATIVE  Urine rapid drug screen (hosp performed)  Result Value Ref Range   Opiates NONE DETECTED NONE DETECTED   Cocaine NONE DETECTED NONE DETECTED   Benzodiazepines NONE DETECTED NONE DETECTED   Amphetamines NONE DETECTED NONE DETECTED   Tetrahydrocannabinol NONE DETECTED NONE DETECTED   Barbiturates NONE DETECTED NONE DETECTED  Urine microscopic-add on  Result Value Ref Range   RBC / HPF 3-6 <3 RBC/hpf   Ct Head Wo Contrast  05/29/2015   CLINICAL DATA:  Confusion. Altered mental status. History of stroke. History of myeloma.  EXAM: CT HEAD WITHOUT CONTRAST  TECHNIQUE: Contiguous axial images were obtained from the base of the skull through the vertex without intravenous contrast.  COMPARISON:  MR brain 01/28/2014.  FINDINGS: No evidence for acute infarction, hemorrhage, mass lesion, hydrocephalus,  or extra-axial fluid. Mild atrophy. Slight hypoattenuation of white matter representing small vessel disease. Remote LEFT cerebellar and LEFT posterior lentiform nucleus infarcts. Extensive osseous disease in the calvarium and skullbase, multiple punched-out lytic lesions, related to multiple myeloma. No epidural tumor is evident.  IMPRESSION: Atrophy and small vessel disease.  No acute intracranial findings.  Osseous changes related to multiple myeloma, without visible epidural tumor.   Electronically Signed   By: Staci Righter M.D.   On: 05/29/2015 16:15   Mr Brain Wo Contrast  05/29/2015   CLINICAL DATA:  Acute onset of confusion. Altered mental status. Memory loss. Personal history of multiple myeloma and hypertension.  EXAM: MRI HEAD WITHOUT CONTRAST  TECHNIQUE: Multiplanar, multiecho pulse sequences of the brain and surrounding structures were obtained without intravenous contrast.  COMPARISON:  CT head from the same day.  MRI brain 01/28/2014.  FINDINGS:  The diffusion-weighted images demonstrate no evidence for acute or subacute infarction. No acute hemorrhage or mass lesion is present. The ventricles are of normal size. Mild periventricular white matter changes bilaterally are stable.  The basal ganglia and brainstem are within normal limits. Flow is present in the major intracranial arteries. The globes and orbits are intact. Minimal mucosal thickening is present in the posterior ethmoid air cells bilaterally. The paranasal sinuses are otherwise clear.  Skullbase is within normal limits. Midline structures are unremarkable.  IMPRESSION: 1. No acute intracranial abnormality or significant interval change. 2. Stable mild atrophy and white matter change, within normal limits for age.   Electronically Signed   By: San Morelle M.D.   On: 05/29/2015 21:13      Antonietta Breach, PA-C 05/29/15 2134  Quintella Reichert, MD 05/30/15 [phone]

## 2015-05-29 NOTE — ED Notes (Signed)
Pts wife reports that he has had sudden onset of altered mental status. Pt was repeating questions and has been increasingly forgetful. Pt is a cancer pt and is on chemo theropy  Spoke to Dr. Tomi Bamberger concerning pts altered mental status and possible code stroke. No code stroke to be called at this time.

## 2015-05-29 NOTE — ED Provider Notes (Signed)
CSN: [id number]     Arrival date & time 05/29/15  1423 History   First MD Initiated Contact with Patient 05/29/15 1714     Chief Complaint  Patient presents with  . Altered Mental Status   HPI  [name] is a 64 year old male with PMHx of multiple myeloma in remission and HTN presenting with AMS. Pt's wife provides most of the history. She states that at 130 PM today, pt became acutely confused and with memory loss. Pt could not remember things like where he was, how he got there, how long he and his wife had been married, etc. She states he has had episodes like this in the past that only lasted about 30 minutes and resolved on their own. Pt's oncologist states these episodes are likely related to the chemotherapy he is taking. Pt takes lenalidomide for MM, 7 days on and 7 days off for past 8 years. Pt states he is in no pain currently. He states he is here because "his wife loves him". Wife denies fevers or recent complaints of pain. Husband answers most of the interview questions with "I don't know" or "I can't remember".   Past Medical History  Diagnosis Date  . Hypertension   . Hyperlipidemia   . Duodenal ulcer   . Multiple myeloma 07/04/2011  . Allergy   . Thyroid disease   . GERD (gastroesophageal reflux disease)   . Depression   . Anemia    Past Surgical History  Procedure Laterality Date  . Cardiolite study  11/25/2003    NORMAL  . Bone marrow transplant  2011    for MM  . Tonsillectomy     Family History  Problem Relation Age of Onset  . Throat cancer Mother   . Hypertension Father   . Stroke Father   . Asthma Father   . Diabetes Father   . Pancreatic cancer Brother    Social History  Substance Use Topics  . Smoking status: Former Smoker -- 3 years    Quit date: 03/27/1969  . Smokeless tobacco: Never Used  . Alcohol Use: No    Review of Systems  Unable to perform ROS     Allergies  Rosuvastatin; Crestor; and Lipitor  Home Medications   Prior to  Admission medications   Medication Sig Start Date End Date Taking? Authorizing Provider  aspirin EC 81 MG tablet Take 81 mg by mouth every morning.   Yes Historical Provider, MD  Bisacodyl (DULCOLAX PO) Take 1 tablet by mouth daily as needed (for constipation).    Yes Historical Provider, MD  cetirizine (ZYRTEC) 10 MG tablet Take 10 mg by mouth daily.   Yes Historical Provider, MD  cholecalciferol (VITAMIN D) 1000 UNITS tablet Take 1 tablet (1,000 Units total) by mouth daily. 03/09/15  Yes Aleksei [name] V, MD  escitalopram (LEXAPRO) 10 MG tablet take 1 tablet by mouth once daily 03/29/15  Yes Aleksei [name] V, MD  lenalidomide (REVLIMID) 5 MG capsule Take 1 capsule (5 mg total) by mouth daily. 7 days on, 7 days off, then repeat Patient taking differently: Take 5 mg by mouth at bedtime. 7 days on, 7 days off, then repeat 05/25/15  Yes Chauncey Cruel, MD  levothyroxine (SYNTHROID, LEVOTHROID) 150 MCG tablet Take 1 tablet (150 mcg total) by mouth daily before breakfast. 10/27/14  Yes Aleksei [name] V, MD  omeprazole (PRILOSEC) 40 MG capsule Take 1 capsule (40 mg total) by mouth every morning. 11/16/14  Yes [name]  Magrinat, MD  sildenafil (VIAGRA) 100 MG tablet Take 1 tablet (100 mg total) by mouth as needed for erectile dysfunction. 07/03/14   Aleksei [name] V, MD   BP 125/73 mmHg  Pulse 54  Temp(Src) 98.5 F (36.9 C) (Oral)  Resp 16  SpO2 100% Physical Exam  Constitutional: He is oriented to person, place, and time. He appears well-developed and well-nourished. No distress.  HENT:  Head: Normocephalic and atraumatic.  Eyes: Conjunctivae and EOM are normal. Pupils are equal, round, and reactive to light.  Neck: Normal range of motion.  Cardiovascular: Normal rate, regular rhythm and normal heart sounds.   Pulmonary/Chest: Effort normal and breath sounds normal. No respiratory distress. He has no wheezes. He has no rales.  Abdominal: Soft. There is no tenderness.  Musculoskeletal:  Normal range of motion.  Neurological: He is alert and oriented to person, place, and time. No cranial nerve deficit.  5/5 motor strength in all extremities. Sensation intact to light touch throughout.  Skin: Skin is warm and dry.  Psychiatric: His affect is inappropriate. Cognition and memory are impaired. He exhibits abnormal recent memory and abnormal remote memory.  Pt with inappropriate affect. Laughing throughout interview. Answers multiple questions with jokes. Impaired memory. Cannot remember where he works, how long he and his wife have been married or what he has been doing the past couple of days. He is not concerned by this. Appears to not be paying attention to the conversation at times.  He is inattentive.  Nursing note and vitals reviewed.   ED Course  Procedures (including critical care time) Labs Review Labs Reviewed  COMPREHENSIVE METABOLIC PANEL - Abnormal; Notable for the following:    Creatinine, Ser 1.32 (*)    GFR calc non Af Amer 55 (*)    All other components within normal limits  CBC - Abnormal; Notable for the following:    RBC 3.87 (*)    Hemoglobin 12.1 (*)    HCT 36.4 (*)    RDW 16.0 (*)    Platelets 125 (*)    All other components within normal limits  URINALYSIS, ROUTINE W REFLEX MICROSCOPIC (NOT AT [facility]) - Abnormal; Notable for the following:    Hgb urine dipstick MODERATE (*)    All other components within normal limits  URINE RAPID DRUG SCREEN, HOSP PERFORMED  URINE MICROSCOPIC-ADD ON    Imaging Review Ct Head Wo Contrast  05/29/2015   CLINICAL DATA:  Confusion. Altered mental status. History of stroke. History of myeloma.  EXAM: CT HEAD WITHOUT CONTRAST  TECHNIQUE: Contiguous axial images were obtained from the base of the skull through the vertex without intravenous contrast.  COMPARISON:  MR brain 01/28/2014.  FINDINGS: No evidence for acute infarction, hemorrhage, mass lesion, hydrocephalus, or extra-axial fluid. Mild atrophy. Slight  hypoattenuation of white matter representing small vessel disease. Remote LEFT cerebellar and LEFT posterior lentiform nucleus infarcts. Extensive osseous disease in the calvarium and skullbase, multiple punched-out lytic lesions, related to multiple myeloma. No epidural tumor is evident.  IMPRESSION: Atrophy and small vessel disease.  No acute intracranial findings.  Osseous changes related to multiple myeloma, without visible epidural tumor.   Electronically Signed   By: Staci Righter M.D.   On: 05/29/2015 16:15   I have personally reviewed and evaluated these images and lab results as part of my medical decision-making.   EKG Interpretation   Date/Time:  Saturday May 29 2015 17:47:11 EDT Ventricular Rate:  55 PR Interval:  138 QRS Duration: 96 QT  Interval:  441 QTC Calculation: 422 R Axis:   -21 Text Interpretation:  Sinus rhythm Borderline left axis deviation  Confirmed by Hazle Coca [phone]) on 05/29/2015 5:52:32 PM      MDM   Final diagnoses:  None   Pt presenting with AMS. Pt having difficulty with memory in particular. Denies current complaints. Wife states he has not been febrile or complaining of any symptoms before the onset of AMS. VSS. GCS 15. Non-focal neuro exam. Pt inattentive and difficult to interview. Inappropriate laughing and joking. Is oriented to person and current place but not why he is here or recent locations. Cannot remember what he did today or yesterday. CBC, CMP, UA, UDS unremarkable. Head CT shows no acute findings. Consulted Dr. Doy Mince (neuro) who recommends brain MR. If brain MR normal, pt stable for discharge and can follow up with oncologist outpatient. Patient's wife is able to care for him at home. Case discussed and handed off to Dr. Ralene Bathe at shift change.     Thales Knipple, PA-C 05/29/15 1936  Quintella Reichert, MD 05/30/15 [phone]

## 2015-05-29 NOTE — ED Notes (Signed)
Pt returned from MRI °

## 2015-05-29 NOTE — Discharge Instructions (Signed)

## 2015-05-29 NOTE — ED Notes (Signed)
Patient transported to CT 

## 2015-05-30 ENCOUNTER — Other Ambulatory Visit: Payer: Self-pay | Admitting: Oncology

## 2015-05-31 ENCOUNTER — Telehealth: Payer: Self-pay | Admitting: *Deleted

## 2015-05-31 NOTE — Telephone Encounter (Signed)
PT. HAD AN "EPISODE" OVER THE WEEKEND. DR.MAGRINAT MAY WANT TO SEE PT. ALSO NEED TO GET LAB RESULTS TO [facility].

## 2015-06-01 ENCOUNTER — Telehealth: Payer: Self-pay | Admitting: *Deleted

## 2015-06-01 NOTE — Telephone Encounter (Signed)
This RN obtained an appointment with Dr Nigel Bridgeman post recent episode over the weekend of global amnesia.  At present [name] feels he is about back to baseline.  He is continuing on his current regimen of revlimid.  This RN obtained an appointment with Dr Nigel Bridgeman at [facility] for 06/14/2015 at 320pm.  Andre [name] aware of above.  Will keep scheduled appointment with Dr Jannifer Rodney.

## 2015-06-06 NOTE — Progress Notes (Signed)
ID: Andre Andre [name]   DOB: 10-20-1950  MR#: [id number]  XNA#:[id number]   PCP: Andre Andre [name] SU: OTHER MD: Andre Andre [name], Andre Andre [name], [address] [place] [phone]), Andre Andre [name]  CHIEF COMPLAINT: Multiple myeloma  CURRENT TREATMENT: Lenalidomide maintenance   HISTORY OF PRESENT ILLNESS: From the original intake nodes:  The patient was worked up for peptic ulcer disease in August of 2011, with significant bleeding and anemia.  The patient was Helicobacter pylori negative.  He received some epinephrine when he had his EGD and then started on Protonix.  [name] anemia slowly resolved so that by September, his hemoglobin was up to 10.9 and by earlier this month, his hemoglobin was up to 12.5.    As part of his general workup, he was found to have a slightly elevated globulin fraction.  In September, his total protein was 8.3 with an albumin of 3.8.  In January, the total protein was 8.4 and albumin 3.6.  With persistence of this slight abnormality, Dr. Olevia Andre [name] obtained serum immunofixation and SPEP.  The SPTP showed an M-spike of 2.67 grams.  A total IgG was 4,190.  Total IgA low at 28.  Total IgM low at 28 also.  The immunofixation showed a monoclonal IgG lambda paraprotein.  There were also monoclonal free lambda light chains present. With this information, the patient was referred for further evaluation.   A diagnosis of myeloma was confirmed by bone marrow biopsy and subsequebnt treatment is as detailed below  INTERVAL HISTORY: Andre Andre [name] returns today for followup of his multiple myeloma accompanied by his wife Andre Andre [name]. He continues on very low-dose lenalidomide maintenance, 5 mg daily 7 days on and 7 days off. He is doing very well on this dose, as far as side effects are concerned, and his labs show no evidence of disease progression.  On 05/29/2015 about 2 PM  I was called by his wife who told me Andre Andre [name] was not acting right. He seemed confused, didn't seem  to know where he was or why he was there. I suggested she take him to the emergency room and he was evaluated there with no focal neurologic findings, unremarkable  electrocardiogram, with a CT of the head and a brain MRI both of which showed no acute changes. There was no evidence of infection, and  his comprehensive metabolic panel and complete blood count were normal. By 8 PM or so he was back to normal. Of note, intercourse had shortly preceded this event. He had not taken sildenafil. Also, he was towards the end of his "off" week on lenalidomide. The problem Andre [name] not recurred.  REVIEW OF SYSTEMS: Andre Andre [name] continues to work full-time. There is some stress from work but not more than usual. He walks 1-2 miles daily for exercise. He is tolerating the very low-dose lenalidomide with no change in symptoms, mostly mild constipation which easily resolves when he remembers to take his stool softeners. He's had no headaches no visual changes no nausea or vomiting or dizziness or gait imbalance, no falls. There have been no medication changes and specifically no benzodiazepines or narcotics. He reports no palpitations, chest pain or pressure, shortness of breath, rash, or bleeding problems. There Andre [name] been no fever. A detailed review of systems today was otherwise noncontributory   PAST MEDICAL HISTORY: Past Medical History  Diagnosis Date  . Hypertension   . Hyperlipidemia   . Duodenal ulcer   . Multiple myeloma 07/04/2011  . Allergy   . Thyroid disease   .  GERD (gastroesophageal reflux disease)   . Depression   . Anemia   Significant for peptic ulcer disease as noted above.  History of hyperlipidemia.  History of anxiety and depression.  History of GERD which is significantly improved with weight loss and history of reactive airway disease, possibly secondary to the GERD which also improved with weight loss.  There was a history of obesity, now much improved secondary to exercise and diet.   PAST SURGICAL  HISTORY: Past Surgical History  Procedure Laterality Date  . Cardiolite study  11/25/2003    NORMAL  . Bone marrow transplant  2011    for MM  . Tonsillectomy      FAMILY HISTORY Family History  Problem Relation Age of Onset  . Throat cancer Mother   . Hypertension Father   . Stroke Father   . Asthma Father   . Diabetes Father   . Pancreatic cancer Brother   [name] father died at the age of 16 following a stroke.  [name] mother died with cancer of the throat at the age of 32.   She had a history of depression and was a smoker.  Not clear how much alcohol she drank, according to the patient.  The patient had two brothers.  One died from the age of 16 from a "kidney problem."  The second one died at the age of 44 from pancreatic cancer. (This may have been duodenal cancer. 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  His wife of 37+ years was Andre Andre [name], but they are now divorced. In 2016 he remarried, his current wife's name is Andre Andre [name].  The patient Andre [name] two daughters:  Andre Andre [name], who works in [place], who is expecting her second child April 2016. .  They both live here in [place]. 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  The patient is very close to his daughters.  Andre Andre [name] first daughter, Andre Andre [name], born 03/06/2013, apparently Andre [name] Andre Andre [name]. The patient is not a church attender.  He does derive a great deal of support from friends at the "[employer] which he attends very regularly, and also participates in Al-Anon even though actually there is no alcohol or drug problem in the family.  He participates in this group and he gets quite a bit of support from it.    ADVANCED DIRECTIVES: In place  HEALTH MAINTENANCE: (updated 07/16/2013) Social History  Substance Use  Topics  . Smoking status: Former Smoker -- 3 years    Quit date: 03/27/1969  . Smokeless tobacco: Never Used  . Alcohol Use: No     Colonoscopy:  PSA:  Sept 2014, normal/Dr. [name]  Lipid panel: Sept 2014/Dr. [name]   Allergies  Allergen Reactions  . Rosuvastatin     Other reaction(s): Liver Disorder  . Crestor [Rosuvastatin Calcium]     ADVERSE EFFECTS ON LIVER  . Lipitor [Atorvastatin Calcium]     Current Outpatient Prescriptions  Medication Sig Dispense Refill  . aspirin EC 81 MG tablet Take 81 mg by mouth every morning.    . Bisacodyl (DULCOLAX PO) Take 1 tablet by mouth daily as needed (for constipation).     . cetirizine (ZYRTEC) 10 MG tablet Take 10 mg by mouth daily.    . cholecalciferol (VITAMIN D) 1000 UNITS tablet Take  1 tablet (1,000 Units total) by mouth daily. 100 tablet 3  . escitalopram (LEXAPRO) 10 MG tablet take 1 tablet by mouth once daily 30 tablet 5  . lenalidomide (REVLIMID) 5 MG capsule Take 1 capsule (5 mg total) by mouth daily. 7 days on, 7 days off, then repeat (Patient taking differently: Take 5 mg by mouth at bedtime. 7 days on, 7 days off, then repeat) 14 capsule 0  . levothyroxine (SYNTHROID, LEVOTHROID) 150 MCG tablet Take 1 tablet (150 mcg total) by mouth daily before breakfast. 90 tablet 2  . omeprazole (PRILOSEC) 40 MG capsule Take 1 capsule (40 mg total) by mouth every morning. 90 capsule 3  . sildenafil (VIAGRA) 100 MG tablet Take 1 tablet (100 mg total) by mouth as needed for erectile dysfunction. 12 tablet 11  . [DISCONTINUED] fexofenadine (ALLEGRA) 60 MG tablet Take 180 mg by mouth daily.       No current facility-administered medications for this visit.    OBJECTIVE: Middle-aged white man in no acute distress Filed Vitals:   06/07/15 0958  BP: 120/92  Pulse: 69  Temp: 97.7 F (36.5 C)  Resp: 18   Body mass index is 26.39 kg/(m^2).  Sclerae unicteric, EOMs intact, pupils round and equal Oropharynx clear, dentition in good  repair No cervical or supraclavicular adenopathy Lungs no rales or rhonchi Heart regular rate and rhythm, no murmur appreciated Abd soft, nontender, positive bowel sounds MSK no focal spinal tenderness, no upper extremity lymphedema Neuro: Entirely nonfocal, well oriented 3, answering without hesitation, appropriate affect   LAB RESULTS:  Lab Results  Component Value Date   WBC 2.7* 06/07/2015   NEUTROABS 1.5 06/07/2015   HGB 11.6* 06/07/2015   HCT 34.4* 06/07/2015   MCV 94.5 06/07/2015   PLT 119* 06/07/2015      Chemistry      Component Value Date/Time   NA 139 06/07/2015 0943   NA 137 05/29/2015 1532   K 4.3 06/07/2015 0943   K 5.0 05/29/2015 1532   CL 104 05/29/2015 1532   CL 107 03/10/2013 1352   CO2 23 06/07/2015 0943   CO2 22 05/29/2015 1532   BUN 16.1 06/07/2015 0943   BUN 20 05/29/2015 1532   CREATININE 1.2 06/07/2015 0943   CREATININE 1.32* 05/29/2015 1532      Component Value Date/Time   CALCIUM 9.3 06/07/2015 0943   CALCIUM 9.6 05/29/2015 1532   ALKPHOS 90 06/07/2015 0943   ALKPHOS 85 05/29/2015 1532   AST 24 06/07/2015 0943   AST 31 05/29/2015 1532   ALT 29 06/07/2015 0943   ALT 24 05/29/2015 1532   BILITOT 0.40 06/07/2015 0943   BILITOT 0.5 05/29/2015 1532    Results for OTONIEL, MYHAND (MRN [id number]) as of 06/07/2015 10:31  Ref. Range 01/19/2015 08:13 02/16/2015 09:03 03/16/2015 08:42 04/13/2015 08:51 05/11/2015 08:50  Kappa free light chain Latest Ref Range: 0.33-1.94 mg/dL 3.94 (H) 4.07 (H) 4.30 (H) 3.87 (H) 3.73 (H)  Lambda Free Lght Chn Latest Ref Range: 0.57-2.63 mg/dL 13.40 (H) 12.70 (H) 14.30 (H) 17.20 (H) 14.40 (H)  Kappa:Lambda Ratio Latest Ref Range: 0.26-1.65  0.29 0.32 0.30 0.23 (L) 0.26   Results for [facility] (MRN [id number]) as of 06/07/2015 10:31  Ref. Range 01/19/2015 08:13 02/16/2015 09:03 03/16/2015 08:42 04/13/2015 08:51 05/11/2015 08:50  Gamma Globulin Latest Ref Range: 0.8-1.7 g/dL 1.3 1.4 1.4 1.5 1.4  A restricted band  consistent with monoclonal protein is present.The monoclonal protein peak accounts for 0.9 g/dL of the  total1.4 g/dL of protein in the gamma region.  STUDIES: Ct Head Wo Contrast  05/29/2015   CLINICAL DATA:  Confusion. Altered mental status. History of stroke. History of myeloma.  EXAM: CT HEAD WITHOUT CONTRAST  TECHNIQUE: Contiguous axial images were obtained from the base of the skull through the vertex without intravenous contrast.  COMPARISON:  MR brain 01/28/2014.  FINDINGS: No evidence for acute infarction, hemorrhage, mass lesion, hydrocephalus, or extra-axial fluid. Mild atrophy. Slight hypoattenuation of white matter representing small vessel disease. Remote LEFT cerebellar and LEFT posterior lentiform nucleus infarcts. Extensive osseous disease in the calvarium and skullbase, multiple punched-out lytic lesions, related to multiple myeloma. No epidural tumor is evident.  IMPRESSION: Atrophy and small vessel disease.  No acute intracranial findings.  Osseous changes related to multiple myeloma, without visible epidural tumor.   Electronically Signed   By: Staci Righter M.D.   On: 05/29/2015 16:15   Mr Brain Wo Contrast  05/29/2015   CLINICAL DATA:  Acute onset of confusion. Altered mental status. Memory loss. Personal history of multiple myeloma and hypertension.  EXAM: MRI HEAD WITHOUT CONTRAST  TECHNIQUE: Multiplanar, multiecho pulse sequences of the brain and surrounding structures were obtained without intravenous contrast.  COMPARISON:  CT head from the same day.  MRI brain 01/28/2014.  FINDINGS: The diffusion-weighted images demonstrate no evidence for acute or subacute infarction. No acute hemorrhage or mass lesion is present. The ventricles are of normal size. Mild periventricular white matter changes bilaterally are stable.  The basal ganglia and brainstem are within normal limits. Flow is present in the major intracranial arteries. The globes and orbits are intact. Minimal mucosal  thickening is present in the posterior ethmoid air cells bilaterally. The paranasal sinuses are otherwise clear.  Skullbase is within normal limits. Midline structures are unremarkable.  IMPRESSION: 1. No acute intracranial abnormality or significant interval change. 2. Stable mild atrophy and white matter change, within normal limits for age.   Electronically Signed   By: San Morelle M.D.   On: 05/29/2015 21:13    ASSESSMENT: 65 y.o.  [place] man with a history of multiple myeloma diagnosed February of 2012, with an initial M-spike of 2.6 g/dL, IFE showing an IgG lambda paraprotein and free lambda light chains in the urine. Cytogenetics showed trisomy 28. Bone marrow biopsy showed a 22% plasmacytosis. Treated with   (1) bortezomib (subcutaneously), lenalidomide, and dexamethasone, with repeat bone marrow biopsy May of 2012 showing 10% plasmacytosis   (2) high-dose chemotherapy with BCNU and melphalan at [facility], followed by stem cell rescue July of 2012   (3) on zoledronic acid started December of 2012, initially monthly, currently given every 3 months, most recent dose 02/17/2014  (4) low-dose lenalidomide resumed April 2013, interrupted several times since.  Resumed again on 02/19/2013, and a dose of 5 mg daily, 21 days on and 7 days off.  (5) CNS symptoms and abnormal brain MRI extensively evaluated by neurology with no definitive diagnosis established; improving   (6) rising M spike noted June 2014 but did not meet criteria for carfilzomib study  (7) on lenalidomide 25 mg daily, 14 days on, 7 days off, starting 04/18/2013, interrupted December 2014 because of rash;   (a) resumed January 2015 at 10 mg/ day at 21 days on/ 7 days off  ([facility]) starting 08/18/2014 decreasedto 10 mg/ day 14 days on, 7 days off because of cytopenias  (c) as of February 2016 he Andre [name] been on 5 mg daily 7 days on 7 days off  (  8) transient global amnesia 05/29/2015, resolved without intervention   PLAN:  I am  not sure what happened on September 10, but it certainly does not seem to have been a stroke or even a TIA. I think it was a blood flow issue to the brain, possibly related to the earlier activity, and not directly related to lenalidomide, since he had been off that medication for one week.  Certainly they will let me know if this happens again, and they will also discuss it further with. Dr. Nigel Bridgeman at [facility], whom they are seeing later this month. I suggested a get a disc with the actual MRI images so we can review them directly at the time of the visit. In terms of prevention, I suggested not taking sildenafil, although that was not related to this incident, and perhaps having something to drink immediately after intercourse encased dehydration was partly responsible for this transient event.  As far as his myeloma is concerned he is doing terrific on very minimal doses of lenalidomide. He is ready scheduled for lab work in October and November and then a bone marrow biopsy in early December. He will see me after that and then he will see Dr. Alvie Heidelberg at [facility] in January  Andre Andre [name] a good understanding of this plan. He agrees with it. He knows to call for any problems that may develop before the next visit here.   [name]  Chauncey Cruel, MD     06/07/2015

## 2015-06-07 ENCOUNTER — Ambulatory Visit (HOSPITAL_BASED_OUTPATIENT_CLINIC_OR_DEPARTMENT_OTHER): Payer: Commercial Managed Care - PPO | Admitting: Oncology

## 2015-06-07 ENCOUNTER — Other Ambulatory Visit (HOSPITAL_BASED_OUTPATIENT_CLINIC_OR_DEPARTMENT_OTHER): Payer: Commercial Managed Care - PPO

## 2015-06-07 VITALS — BP 120/92 | HR 69 | Temp 97.7°F | Resp 18 | Ht 70.0 in | Wt 183.9 lb

## 2015-06-07 DIAGNOSIS — R930 Abnormal findings on diagnostic imaging of skull and head, not elsewhere classified: Secondary | ICD-10-CM

## 2015-06-07 DIAGNOSIS — C9000 Multiple myeloma not having achieved remission: Secondary | ICD-10-CM

## 2015-06-07 DIAGNOSIS — R9089 Other abnormal findings on diagnostic imaging of central nervous system: Secondary | ICD-10-CM

## 2015-06-07 DIAGNOSIS — G454 Transient global amnesia: Secondary | ICD-10-CM | POA: Diagnosis not present

## 2015-06-07 HISTORY — DX: Transient global amnesia: G45.4

## 2015-06-07 LAB — COMPREHENSIVE METABOLIC PANEL (CC13)
ALBUMIN: 3.5 g/dL (ref 3.5–5.0)
ALK PHOS: 90 U/L (ref 40–150)
ALT: 29 U/L (ref 0–55)
ANION GAP: 7 meq/L (ref 3–11)
AST: 24 U/L (ref 5–34)
BILIRUBIN TOTAL: 0.4 mg/dL (ref 0.20–1.20)
BUN: 16.1 mg/dL (ref 7.0–26.0)
CALCIUM: 9.3 mg/dL (ref 8.4–10.4)
CO2: 23 mEq/L (ref 22–29)
Chloride: 108 mEq/L (ref 98–109)
Creatinine: 1.2 mg/dL (ref 0.7–1.3)
EGFR: 64 mL/min/{1.73_m2} — AB (ref >90)
Glucose: 87 mg/dl (ref 70–140)
POTASSIUM: 4.3 meq/L (ref 3.5–5.1)
Sodium: 139 mEq/L (ref 136–145)
TOTAL PROTEIN: 7.2 g/dL (ref 6.4–8.3)

## 2015-06-07 LAB — CBC WITH DIFFERENTIAL/PLATELET
BASO%: 0.9 % (ref 0.0–2.0)
Basophils Absolute: 0 10*3/uL (ref 0.0–0.1)
EOS%: 7.5 % — ABNORMAL HIGH (ref 0.0–7.0)
Eosinophils Absolute: 0.2 10*3/uL (ref 0.0–0.5)
HEMATOCRIT: 34.4 % — AB (ref 38.4–49.9)
HEMOGLOBIN: 11.6 g/dL — AB (ref 13.0–17.1)
LYMPH%: 23.4 % (ref 14.0–49.0)
MCH: 31.9 pg (ref 27.2–33.4)
MCHC: 33.7 g/dL (ref 32.0–36.0)
MCV: 94.5 fL (ref 79.3–98.0)
MONO#: 0.3 10*3/uL (ref 0.1–0.9)
MONO%: 12.3 % (ref 0.0–14.0)
NEUT#: 1.5 10*3/uL (ref 1.5–6.5)
NEUT%: 55.9 % (ref 39.0–75.0)
PLATELETS: 119 10*3/uL — AB (ref 140–400)
RBC: 3.64 10*6/uL — ABNORMAL LOW (ref 4.20–5.82)
RDW: 17 % — ABNORMAL HIGH (ref 11.0–14.6)
WBC: 2.7 10*3/uL — AB (ref 4.0–10.3)
lymph#: 0.6 10*3/uL — ABNORMAL LOW (ref 0.9–3.3)

## 2015-06-08 ENCOUNTER — Other Ambulatory Visit: Payer: Commercial Managed Care - PPO

## 2015-06-14 ENCOUNTER — Encounter: Payer: Self-pay | Admitting: Internal Medicine

## 2015-06-16 ENCOUNTER — Telehealth: Payer: Self-pay | Admitting: Oncology

## 2015-06-16 ENCOUNTER — Encounter: Payer: Self-pay | Admitting: Internal Medicine

## 2015-06-16 NOTE — Telephone Encounter (Signed)
Faxed pt medical records to [facility]

## 2015-06-21 ENCOUNTER — Telehealth: Payer: Self-pay | Admitting: *Deleted

## 2015-06-21 NOTE — Telephone Encounter (Signed)
PT. IS TAKING ALEVE AND FORCING FLUIDS. NO FEVER. PT. HAS A COUGH WITH YELLOW SPUTUM. HE HAS SOME WHEEZING BUT NO SHORTNESS OF BREATH. PT. HAS A RUNNY NOSE WITH CLEAR MUCUS. ANY SUGGESTIONS?

## 2015-06-22 ENCOUNTER — Other Ambulatory Visit: Payer: Self-pay | Admitting: *Deleted

## 2015-06-22 DIAGNOSIS — C9000 Multiple myeloma not having achieved remission: Secondary | ICD-10-CM

## 2015-06-22 MED ORDER — LENALIDOMIDE 5 MG PO CAPS: 5.0000 mg | ORAL_CAPSULE | Freq: Every day | ORAL | Status: DC

## 2015-06-22 NOTE — Telephone Encounter (Signed)
Signed prescription faxed to [facility] at [phone]. Original prescription sent to HIM to be scanned into patient's chart.

## 2015-06-23 ENCOUNTER — Telehealth: Payer: Self-pay | Admitting: *Deleted

## 2015-06-23 NOTE — Telephone Encounter (Signed)
Call received "to check on the status of my revlimid."  Informed order was faxed to [facility] yesterday at 1800.  Reports he is due to resume on Monday 06-28-2015.

## 2015-07-06 ENCOUNTER — Other Ambulatory Visit (HOSPITAL_BASED_OUTPATIENT_CLINIC_OR_DEPARTMENT_OTHER): Payer: Commercial Managed Care - PPO

## 2015-07-06 DIAGNOSIS — C9000 Multiple myeloma not having achieved remission: Secondary | ICD-10-CM | POA: Diagnosis not present

## 2015-07-06 LAB — CBC WITH DIFFERENTIAL/PLATELET
BASO%: 0.7 % (ref 0.0–2.0)
Basophils Absolute: 0 10*3/uL (ref 0.0–0.1)
EOS%: 8.8 % — AB (ref 0.0–7.0)
Eosinophils Absolute: 0.2 10*3/uL (ref 0.0–0.5)
HCT: 34.4 % — ABNORMAL LOW (ref 38.4–49.9)
HGB: 11.9 g/dL — ABNORMAL LOW (ref 13.0–17.1)
LYMPH%: 27.5 % (ref 14.0–49.0)
MCH: 32.2 pg (ref 27.2–33.4)
MCHC: 34.6 g/dL (ref 32.0–36.0)
MCV: 93.2 fL (ref 79.3–98.0)
MONO#: 0.3 10*3/uL (ref 0.1–0.9)
MONO%: 10.3 % (ref 0.0–14.0)
NEUT#: 1.4 10*3/uL — ABNORMAL LOW (ref 1.5–6.5)
NEUT%: 52.7 % (ref 39.0–75.0)
Platelets: 120 10*3/uL — ABNORMAL LOW (ref 140–400)
RBC: 3.69 10*6/uL — AB (ref 4.20–5.82)
RDW: 16 % — ABNORMAL HIGH (ref 11.0–14.6)
WBC: 2.7 10*3/uL — ABNORMAL LOW (ref 4.0–10.3)
lymph#: 0.8 10*3/uL — ABNORMAL LOW (ref 0.9–3.3)

## 2015-07-06 LAB — COMPREHENSIVE METABOLIC PANEL (CC13)
ALT: 26 U/L (ref 0–55)
ANION GAP: 9 meq/L (ref 3–11)
AST: 24 U/L (ref 5–34)
Albumin: 3.5 g/dL (ref 3.5–5.0)
Alkaline Phosphatase: 86 U/L (ref 40–150)
BUN: 22.2 mg/dL (ref 7.0–26.0)
CHLORIDE: 109 meq/L (ref 98–109)
CO2: 19 meq/L — AB (ref 22–29)
CREATININE: 1.4 mg/dL — AB (ref 0.7–1.3)
Calcium: 9.4 mg/dL (ref 8.4–10.4)
EGFR: 52 mL/min/{1.73_m2} — ABNORMAL LOW (ref >90)
GLUCOSE: 106 mg/dL (ref 70–140)
Potassium: 4 mEq/L (ref 3.5–5.1)
SODIUM: 137 meq/L (ref 136–145)
Total Bilirubin: 0.53 mg/dL (ref 0.20–1.20)
Total Protein: 7.4 g/dL (ref 6.4–8.3)

## 2015-07-08 ENCOUNTER — Ambulatory Visit (INDEPENDENT_AMBULATORY_CARE_PROVIDER_SITE_OTHER): Payer: Commercial Managed Care - PPO | Admitting: Internal Medicine

## 2015-07-08 ENCOUNTER — Other Ambulatory Visit (INDEPENDENT_AMBULATORY_CARE_PROVIDER_SITE_OTHER): Payer: Commercial Managed Care - PPO

## 2015-07-08 ENCOUNTER — Other Ambulatory Visit: Payer: Self-pay | Admitting: Internal Medicine

## 2015-07-08 ENCOUNTER — Ambulatory Visit (INDEPENDENT_AMBULATORY_CARE_PROVIDER_SITE_OTHER)
Admission: RE | Admit: 2015-07-08 | Discharge: 2015-07-08 | Disposition: A | Discharge status: Home / Self Care | Payer: Commercial Managed Care - PPO | Source: Ambulatory Visit | Attending: Internal Medicine | Admitting: Internal Medicine

## 2015-07-08 ENCOUNTER — Encounter: Payer: Self-pay | Admitting: Internal Medicine

## 2015-07-08 VITALS — BP 150/98 | HR 52 | Temp 98.3°F | Ht 70.0 in | Wt 185.0 lb

## 2015-07-08 DIAGNOSIS — E038 Other specified hypothyroidism: Secondary | ICD-10-CM

## 2015-07-08 DIAGNOSIS — Z0000 Encounter for general adult medical examination without abnormal findings: Secondary | ICD-10-CM | POA: Diagnosis not present

## 2015-07-08 DIAGNOSIS — L570 Actinic keratosis: Secondary | ICD-10-CM | POA: Diagnosis not present

## 2015-07-08 DIAGNOSIS — R05 Cough: Secondary | ICD-10-CM

## 2015-07-08 DIAGNOSIS — E559 Vitamin D deficiency, unspecified: Secondary | ICD-10-CM

## 2015-07-08 DIAGNOSIS — E034 Atrophy of thyroid (acquired): Secondary | ICD-10-CM

## 2015-07-08 DIAGNOSIS — R059 Cough, unspecified: Secondary | ICD-10-CM

## 2015-07-08 LAB — URINALYSIS, ROUTINE W REFLEX MICROSCOPIC
BILIRUBIN URINE: NEGATIVE
Ketones, ur: NEGATIVE
LEUKOCYTES UA: NEGATIVE
NITRITE: NEGATIVE
Specific Gravity, Urine: <=1.005 — AB (ref 1.000–1.030)
TOTAL PROTEIN, URINE-UPE24: NEGATIVE
UROBILINOGEN UA: 0.2 (ref 0.0–1.0)
Urine Glucose: NEGATIVE
WBC, UA: NONE SEEN (ref 0–?)
pH: 5.5 (ref 5.0–8.0)

## 2015-07-08 LAB — LIPID PANEL
CHOL/HDL RATIO: 5
CHOLESTEROL: 181 mg/dL (ref 0–200)
HDL: 38.9 mg/dL — ABNORMAL LOW (ref >39.00)
LDL Cholesterol: 103 mg/dL — ABNORMAL HIGH (ref 0–99)
NonHDL: 141.61
TRIGLYCERIDES: 191 mg/dL — AB (ref 0.0–149.0)
VLDL: 38.2 mg/dL (ref 0.0–40.0)

## 2015-07-08 LAB — TSH: TSH: 0.04 u[IU]/mL — AB (ref 0.35–4.50)

## 2015-07-08 LAB — PSA: PSA: 0.7 ng/mL (ref 0.10–4.00)

## 2015-07-08 MED ORDER — BENZONATATE 200 MG PO CAPS: 200.0000 mg | ORAL_CAPSULE | Freq: Two times a day (BID) | ORAL | Status: DC | PRN

## 2015-07-08 NOTE — Progress Notes (Signed)
Subjective:  Patient ID: Andre [name], male    DOB: 1951/06/15  Age: 64 y.o. MRN: [id number]  CC: No chief complaint on file.   HPI Andre [name] presents for a well exam. C/o cough - x1 month after a URI. C/o GERD sx's  Outpatient Prescriptions Prior to Visit  Medication Sig Dispense Refill  . aspirin EC 81 MG tablet Take 81 mg by mouth every morning.    . Bisacodyl (DULCOLAX PO) Take 1 tablet by mouth daily as needed (for constipation).     . cetirizine (ZYRTEC) 10 MG tablet Take 10 mg by mouth daily.    . cholecalciferol (VITAMIN D) 1000 UNITS tablet Take 1 tablet (1,000 Units total) by mouth daily. 100 tablet 3  . escitalopram (LEXAPRO) 10 MG tablet take 1 tablet by mouth once daily 30 tablet 5  . lenalidomide (REVLIMID) 5 MG capsule Take 1 capsule (5 mg total) by mouth daily. 7 days on, 7 days off, then repeat 14 capsule 0  . omeprazole (PRILOSEC) 40 MG capsule Take 1 capsule (40 mg total) by mouth every morning. 90 capsule 3  . sildenafil (VIAGRA) 100 MG tablet Take 1 tablet (100 mg total) by mouth as needed for erectile dysfunction. 12 tablet 11  . levothyroxine (SYNTHROID, LEVOTHROID) 150 MCG tablet Take 1 tablet (150 mcg total) by mouth daily before breakfast. 90 tablet 2   No facility-administered medications prior to visit.    ROS Review of Systems  Constitutional: Negative for appetite change, fatigue and unexpected weight change.  HENT: Negative for congestion, nosebleeds, sneezing, sore throat and trouble swallowing.   Eyes: Negative for itching and visual disturbance.  Respiratory: Positive for cough.   Cardiovascular: Negative for chest pain, palpitations and leg swelling.  Gastrointestinal: Negative for nausea, diarrhea, blood in stool and abdominal distention.  Genitourinary: Negative for frequency and hematuria.  Musculoskeletal: Negative for back pain, joint swelling, gait problem and neck pain.  Skin: Negative for rash.  Neurological: Negative for  dizziness, tremors, speech difficulty and weakness.  Psychiatric/Behavioral: Negative for sleep disturbance, dysphoric mood and agitation. The patient is not nervous/anxious.     Objective:  BP 150/98 mmHg  Pulse 52  Temp(Src) 98.3 F (36.8 C) (Oral)  Ht 5' 10"  (1.778 m)  Wt 185 lb (83.915 kg)  BMI 26.54 kg/m2  SpO2 98%  BP Readings from Last 3 Encounters:  07/08/15 150/98  06/07/15 120/92  05/29/15 113/59    Wt Readings from Last 3 Encounters:  07/08/15 185 lb (83.915 kg)  06/07/15 183 lb 14.4 oz (83.416 kg)  02/22/15 182 lb 6.4 oz (82.736 kg)    Physical Exam  Constitutional: He is oriented to person, place, and time. He appears well-developed. No distress.  NAD  HENT:  Mouth/Throat: Oropharynx is clear and moist.  Eyes: Conjunctivae are normal. Pupils are equal, round, and reactive to light.  Neck: Normal range of motion. No JVD present. No thyromegaly present.  Cardiovascular: Normal rate, regular rhythm, normal heart sounds and intact distal pulses.  Exam reveals no gallop and no friction rub.   No murmur heard. Pulmonary/Chest: Effort normal and breath sounds normal. No respiratory distress. He has no wheezes. He has no rales. He exhibits no tenderness.  Abdominal: Soft. Bowel sounds are normal. He exhibits no distension and no mass. There is no tenderness. There is no rebound and no guarding.  Musculoskeletal: Normal range of motion. He exhibits no edema or tenderness.  Lymphadenopathy:    He has no cervical  adenopathy.  Neurological: He is alert and oriented to person, place, and time. He has normal reflexes. No cranial nerve deficit. He exhibits normal muscle tone. He displays a negative Romberg sign. Coordination and gait normal.  Skin: Skin is warm and dry. No rash noted.  Psychiatric: He has a normal mood and affect. His behavior is normal. Judgment and thought content normal.  AK L temple   Lab Results  Component Value Date   WBC 2.7* 07/06/2015   HGB  11.9* 07/06/2015   HCT 34.4* 07/06/2015   PLT 120* 07/06/2015   GLUCOSE 106 07/06/2015   CHOL 181 07/08/2015   TRIG 191.0* 07/08/2015   HDL 38.90* 07/08/2015   LDLDIRECT 155.8 11/13/2012   LDLCALC 103* 07/08/2015   ALT 26 07/06/2015   AST 24 07/06/2015   NA 137 07/06/2015   K 4.0 07/06/2015   CL 104 05/29/2015   CREATININE 1.4* 07/06/2015   BUN 22.2 07/06/2015   CO2 19* 07/06/2015   TSH 0.04* 07/08/2015   PSA 0.70 07/08/2015   INR 0.91 03/31/2013   HGBA1C 5.4 03/27/2012    Ct Head Wo Contrast  05/29/2015  CLINICAL DATA:  Confusion. Altered mental status. History of stroke. History of myeloma. EXAM: CT HEAD WITHOUT CONTRAST TECHNIQUE: Contiguous axial images were obtained from the base of the skull through the vertex without intravenous contrast. COMPARISON:  MR brain 01/28/2014. FINDINGS: No evidence for acute infarction, hemorrhage, mass lesion, hydrocephalus, or extra-axial fluid. Mild atrophy. Slight hypoattenuation of white matter representing small vessel disease. Remote LEFT cerebellar and LEFT posterior lentiform nucleus infarcts. Extensive osseous disease in the calvarium and skullbase, multiple punched-out lytic lesions, related to multiple myeloma. No epidural tumor is evident. IMPRESSION: Atrophy and small vessel disease.  No acute intracranial findings. Osseous changes related to multiple myeloma, without visible epidural tumor. Electronically Signed   By: Staci Righter M.D.   On: 05/29/2015 16:15   Mr Brain Wo Contrast  05/29/2015  CLINICAL DATA:  Acute onset of confusion. Altered mental status. Memory loss. Personal history of multiple myeloma and hypertension. EXAM: MRI HEAD WITHOUT CONTRAST TECHNIQUE: Multiplanar, multiecho pulse sequences of the brain and surrounding structures were obtained without intravenous contrast. COMPARISON:  CT head from the same day.  MRI brain 01/28/2014. FINDINGS: The diffusion-weighted images demonstrate no evidence for acute or subacute  infarction. No acute hemorrhage or mass lesion is present. The ventricles are of normal size. Mild periventricular white matter changes bilaterally are stable. The basal ganglia and brainstem are within normal limits. Flow is present in the major intracranial arteries. The globes and orbits are intact. Minimal mucosal thickening is present in the posterior ethmoid air cells bilaterally. The paranasal sinuses are otherwise clear. Skullbase is within normal limits. Midline structures are unremarkable. IMPRESSION: 1. No acute intracranial abnormality or significant interval change. 2. Stable mild atrophy and white matter change, within normal limits for age. Electronically Signed   By: San Morelle M.D.   On: 05/29/2015 21:13    Procedure Note :     Procedure : Cryosurgery   Indication:  Actinic keratosis(es)   Risks including unsuccessful procedure , bleeding, infection, bruising, scar, a need for a repeat  procedure and others were explained to the patient in detail as well as the benefits. Informed consent was obtained verbally.   1  lesion(s)  on  L temple  was/were treated with liquid nitrogen on a Q-tip in a usual fasion . Band-Aid was applied and antibiotic ointment was given for a  later use.   Tolerated well. Complications none.   Postprocedure instructions :     Keep the wounds clean. You can wash them with liquid soap and water. Pat dry with gauze or a Kleenex tissue  Before applying antibiotic ointment and a Band-Aid.   You need to report immediately  if  any signs of infection develop.    Assessment & Plan:   Diagnoses and all orders for this visit:  Well adult exam -     TSH; Future -     PSA; Future -     Lipid panel; Future -     Urinalysis; Future  Vitamin D deficiency  Cough -     DG Chest 2 View  Hypothyroidism due to acquired atrophy of thyroid -     TSH; Future -     Lipid panel; Future  Other orders -     benzonatate (TESSALON) 200 MG capsule; Take 1  capsule (200 mg total) by mouth 2 (two) times daily as needed for cough.   I am having Mr. Creps start on benzonatate. I am also having him maintain his aspirin EC, Bisacodyl (DULCOLAX PO), cetirizine, sildenafil, omeprazole, cholecalciferol, escitalopram, and lenalidomide.  Meds ordered this encounter  Medications  . benzonatate (TESSALON) 200 MG capsule    Sig: Take 1 capsule (200 mg total) by mouth 2 (two) times daily as needed for cough.    Dispense:  30 capsule    Refill:  0     Follow-up: Return in about 6 months (around 01/06/2016) for a follow-up visit.  [name], MD

## 2015-07-08 NOTE — Assessment & Plan Note (Signed)
L temple See cryo

## 2015-07-08 NOTE — Assessment & Plan Note (Signed)
Post-URI. Better Tessalon prn

## 2015-07-08 NOTE — Assessment & Plan Note (Signed)

## 2015-07-08 NOTE — Assessment & Plan Note (Signed)
On Vit D 

## 2015-07-08 NOTE — Assessment & Plan Note (Signed)
On Levothroid 

## 2015-07-08 NOTE — Patient Instructions (Addendum)
Postprocedure instructions :     Keep the wounds clean. You can wash them with liquid soap and water. Pat dry with gauze or a Kleenex tissue  Before applying antibiotic ointment and a Band-Aid. 

## 2015-07-08 NOTE — Progress Notes (Signed)
Pre visit review using our clinic review tool, if applicable. No additional management support is needed unless otherwise documented below in the visit note. 

## 2015-07-20 ENCOUNTER — Other Ambulatory Visit: Payer: Self-pay | Admitting: *Deleted

## 2015-07-20 DIAGNOSIS — C9000 Multiple myeloma not having achieved remission: Secondary | ICD-10-CM

## 2015-07-20 MED ORDER — LENALIDOMIDE 5 MG PO CAPS
5.0000 mg | ORAL_CAPSULE | Freq: Every day | ORAL | Status: DC
Start: 2015-07-20 — End: 2015-08-16

## 2015-08-03 ENCOUNTER — Other Ambulatory Visit (HOSPITAL_BASED_OUTPATIENT_CLINIC_OR_DEPARTMENT_OTHER): Payer: Commercial Managed Care - PPO

## 2015-08-03 DIAGNOSIS — C9000 Multiple myeloma not having achieved remission: Secondary | ICD-10-CM

## 2015-08-03 LAB — CBC WITH DIFFERENTIAL/PLATELET
BASO%: 0.3 % (ref 0.0–2.0)
BASOS ABS: 0 10*3/uL (ref 0.0–0.1)
EOS ABS: 0.4 10*3/uL (ref 0.0–0.5)
EOS%: 12.3 % — AB (ref 0.0–7.0)
HEMATOCRIT: 35.6 % — AB (ref 38.4–49.9)
HEMOGLOBIN: 11.9 g/dL — AB (ref 13.0–17.1)
LYMPH#: 0.7 10*3/uL — AB (ref 0.9–3.3)
LYMPH%: 23.3 % (ref 14.0–49.0)
MCH: 31.9 pg (ref 27.2–33.4)
MCHC: 33.4 g/dL (ref 32.0–36.0)
MCV: 95.4 fL (ref 79.3–98.0)
MONO#: 0.4 10*3/uL (ref 0.1–0.9)
MONO%: 14.7 % — ABNORMAL HIGH (ref 0.0–14.0)
NEUT#: 1.5 10*3/uL (ref 1.5–6.5)
NEUT%: 49.4 % (ref 39.0–75.0)
PLATELETS: 125 10*3/uL — AB (ref 140–400)
RBC: 3.73 10*6/uL — ABNORMAL LOW (ref 4.20–5.82)
RDW: 16.2 % — AB (ref 11.0–14.6)
WBC: 3 10*3/uL — ABNORMAL LOW (ref 4.0–10.3)

## 2015-08-03 LAB — COMPREHENSIVE METABOLIC PANEL (CC13)
ALBUMIN: 3.3 g/dL — AB (ref 3.5–5.0)
ALK PHOS: 98 U/L (ref 40–150)
ALT: 27 U/L (ref 0–55)
ANION GAP: 8 meq/L (ref 3–11)
AST: 26 U/L (ref 5–34)
BUN: 19.3 mg/dL (ref 7.0–26.0)
CALCIUM: 9.4 mg/dL (ref 8.4–10.4)
CHLORIDE: 110 meq/L — AB (ref 98–109)
CO2: 19 mEq/L — ABNORMAL LOW (ref 22–29)
Creatinine: 1.3 mg/dL (ref 0.7–1.3)
EGFR: 56 mL/min/{1.73_m2} — ABNORMAL LOW (ref >90)
Glucose: 91 mg/dl (ref 70–140)
POTASSIUM: 3.9 meq/L (ref 3.5–5.1)
Sodium: 137 mEq/L (ref 136–145)
Total Bilirubin: 0.43 mg/dL (ref 0.20–1.20)
Total Protein: 7.3 g/dL (ref 6.4–8.3)

## 2015-08-13 ENCOUNTER — Other Ambulatory Visit: Payer: Self-pay | Admitting: *Deleted

## 2015-08-13 DIAGNOSIS — C9000 Multiple myeloma not having achieved remission: Secondary | ICD-10-CM

## 2015-08-16 ENCOUNTER — Other Ambulatory Visit: Payer: Self-pay

## 2015-08-16 DIAGNOSIS — C9000 Multiple myeloma not having achieved remission: Secondary | ICD-10-CM

## 2015-08-16 MED ORDER — LENALIDOMIDE 5 MG PO CAPS: 5.0000 mg | ORAL_CAPSULE | Freq: Every day | ORAL | Status: DC

## 2015-08-19 ENCOUNTER — Other Ambulatory Visit: Payer: Self-pay

## 2015-08-23 ENCOUNTER — Other Ambulatory Visit: Payer: Self-pay | Admitting: Radiology

## 2015-08-24 ENCOUNTER — Ambulatory Visit (HOSPITAL_COMMUNITY)
Admission: RE | Admit: 2015-08-24 | Discharge: 2015-08-24 | Disposition: A | Discharge status: Home / Self Care | Payer: Commercial Managed Care - PPO | Source: Ambulatory Visit | Attending: Oncology | Admitting: Oncology

## 2015-08-24 ENCOUNTER — Encounter (HOSPITAL_COMMUNITY): Payer: Self-pay

## 2015-08-24 DIAGNOSIS — C9000 Multiple myeloma not having achieved remission: Secondary | ICD-10-CM

## 2015-08-24 DIAGNOSIS — R5383 Other fatigue: Secondary | ICD-10-CM | POA: Insufficient documentation

## 2015-08-24 DIAGNOSIS — F4321 Adjustment disorder with depressed mood: Secondary | ICD-10-CM | POA: Insufficient documentation

## 2015-08-24 DIAGNOSIS — G4700 Insomnia, unspecified: Secondary | ICD-10-CM | POA: Diagnosis not present

## 2015-08-24 DIAGNOSIS — H532 Diplopia: Secondary | ICD-10-CM | POA: Insufficient documentation

## 2015-08-24 DIAGNOSIS — Z01812 Encounter for preprocedural laboratory examination: Secondary | ICD-10-CM | POA: Insufficient documentation

## 2015-08-24 DIAGNOSIS — D649 Anemia, unspecified: Secondary | ICD-10-CM | POA: Insufficient documentation

## 2015-08-24 DIAGNOSIS — D696 Thrombocytopenia, unspecified: Secondary | ICD-10-CM | POA: Insufficient documentation

## 2015-08-24 HISTORY — DX: Diplopia: H53.2

## 2015-08-24 HISTORY — DX: Hypothyroidism, unspecified: E03.9

## 2015-08-24 HISTORY — DX: Unspecified osteoarthritis, unspecified site: M19.90

## 2015-08-24 HISTORY — DX: Unspecified asthma, uncomplicated: J45.909

## 2015-08-24 LAB — CBC WITH DIFFERENTIAL/PLATELET
BASOS PCT: 0 %
Basophils Absolute: 0 10*3/uL (ref 0.0–0.1)
EOS ABS: 0.3 10*3/uL (ref 0.0–0.7)
Eosinophils Relative: 7 %
HEMATOCRIT: 37.1 % — AB (ref 39.0–52.0)
HEMOGLOBIN: 12.5 g/dL — AB (ref 13.0–17.0)
Lymphocytes Relative: 24 %
Lymphs Abs: 1.1 10*3/uL (ref 0.7–4.0)
MCH: 32.1 pg (ref 26.0–34.0)
MCHC: 33.7 g/dL (ref 30.0–36.0)
MCV: 95.1 fL (ref 78.0–100.0)
Monocytes Absolute: 1 10*3/uL (ref 0.1–1.0)
Monocytes Relative: 22 %
NEUTROS ABS: 2.1 10*3/uL (ref 1.7–7.7)
NEUTROS PCT: 47 %
Platelets: 132 10*3/uL — ABNORMAL LOW (ref 150–400)
RBC: 3.9 MIL/uL — AB (ref 4.22–5.81)
RDW: 16 % — ABNORMAL HIGH (ref 11.5–15.5)
WBC: 4.6 10*3/uL (ref 4.0–10.5)

## 2015-08-24 LAB — PROTIME-INR
INR: 1 (ref 0.00–1.49)
Prothrombin Time: 13.4 s (ref 11.6–15.2)

## 2015-08-24 LAB — BONE MARROW EXAM

## 2015-08-24 MED ORDER — FENTANYL CITRATE (PF) 100 MCG/2ML IJ SOLN
INTRAMUSCULAR | Status: AC
  Filled 2015-08-24: qty 4

## 2015-08-24 MED ORDER — SODIUM CHLORIDE 0.9 % IV SOLN
INTRAVENOUS | Status: DC
  Administered 2015-08-24: 10:00:00 via INTRAVENOUS

## 2015-08-24 MED ORDER — MIDAZOLAM HCL 2 MG/2ML IJ SOLN
INTRAMUSCULAR | Status: AC
  Filled 2015-08-24: qty 6

## 2015-08-24 MED ORDER — MIDAZOLAM HCL 2 MG/2ML IJ SOLN
INTRAMUSCULAR | Status: AC | PRN
  Administered 2015-08-24 (×3): 1 mg via INTRAVENOUS

## 2015-08-24 MED ORDER — HYDROCODONE-ACETAMINOPHEN 5-325 MG PO TABS
1.0000 | ORAL_TABLET | ORAL | Status: DC | PRN
  Filled 2015-08-24: qty 2

## 2015-08-24 MED ORDER — FENTANYL CITRATE (PF) 100 MCG/2ML IJ SOLN
INTRAMUSCULAR | Status: AC | PRN
  Administered 2015-08-24: 50 ug via INTRAVENOUS

## 2015-08-24 NOTE — Procedures (Signed)
CT-guided  R iliac bone marrow aspiration and core biopsy No complication No blood loss. See complete dictation in [facility] PACS  

## 2015-08-24 NOTE — Progress Notes (Signed)
Patient ID: Andre [name], male   DOB: [birth year], 64 y.o.   MRN: [id number]    Referring Physician(s): Magrinat,Gustav C  Chief Complaint:  Multiple myeloma  Subjective: Patient familiar to IR service from prior bone marrow biopsy in 2014. He has a history of multiple myeloma and is currently on lenalidomide maintenance therapy. He presents today for follow-up CT-guided bone marrow biopsy to gauge treatment response. He currently denies fevers, chills, HA, CP, dyspnea, cough, abd/back pain, N/V or abnormal bleeding.    Allergies: Rosuvastatin; Crestor; and Lipitor  Medications: Prior to Admission medications   Medication Sig Start Date End Date Taking? Authorizing Provider  Acetaminophen (TYLENOL 8 HOUR ARTHRITIS PAIN PO) Take by mouth.   Yes Historical Provider, MD  aspirin EC 81 MG tablet Take 81 mg by mouth every morning.   Yes Historical Provider, MD  cetirizine (ZYRTEC) 10 MG tablet Take 10 mg by mouth daily.   Yes Historical Provider, MD  cholecalciferol (VITAMIN D) 1000 UNITS tablet Take 1 tablet (1,000 Units total) by mouth daily. 03/09/15  Yes Aleksei [name] V, MD  escitalopram (LEXAPRO) 10 MG tablet take 1 tablet by mouth once daily 03/29/15  Yes Aleksei [name] V, MD  lenalidomide (REVLIMID) 5 MG capsule Take 1 capsule (5 mg total) by mouth daily. 7 days on, 7 days off, then repeat 08/16/15  Yes Chauncey Cruel, MD  levothyroxine (SYNTHROID, LEVOTHROID) 150 MCG tablet Take 1 tablet by mouth  daily 07/08/15  Yes Aleksei [name] V, MD  naproxen (NAPROSYN) 250 MG tablet Take by mouth 2 (two) times daily with a meal.   Yes Historical Provider, MD  omeprazole (PRILOSEC) 40 MG capsule Take 1 capsule (40 mg total) by mouth every morning. 11/16/14  Yes Chauncey Cruel, MD  benzonatate (TESSALON) 200 MG capsule Take 1 capsule (200 mg total) by mouth 2 (two) times daily as needed for cough. 07/08/15   Aleksei [name] V, MD  Bisacodyl (DULCOLAX PO) Take 1 tablet by mouth  daily as needed (for constipation).     Historical Provider, MD  sildenafil (VIAGRA) 100 MG tablet Take 1 tablet (100 mg total) by mouth as needed for erectile dysfunction. 07/03/14   Aleksei [name] V, MD     Vital Signs: BP 117/87 mmHg  Pulse 67  Temp(Src) 97.7 F (36.5 C) (Oral)  Resp 18  SpO2 100%  Physical Exam patient awake, alert. Chest clear to auscultation bilaterally. Heart with regular rate and rhythm. Abdomen soft, positive bowel sounds, nontender. Extremities with full range of motion and no significant edema.  Imaging: No results found.  Labs:  CBC:  Recent Labs  06/07/15 0943 07/06/15 0827 08/03/15 0847 08/24/15 0930  WBC 2.7* 2.7* 3.0* 4.6  HGB 11.6* 11.9* 11.9* 12.5*  HCT 34.4* 34.4* 35.6* 37.1*  PLT 119* 120* 125* 132*    COAGS: No results for input(s): INR, APTT in the last 8760 hours.  BMP:  Recent Labs  09/16/14 0750  10/23/14 0842  05/29/15 1532 06/07/15 0943 07/06/15 0827 08/03/15 0848  NA 139  < > 138  < > 137 139 137 137  K 5.3*  < > 4.1  < > 5.0 4.3 4.0 3.9  CL 108  --  108  --  104  --   --   --   CO2 24  < > 24  < > 22 23 19* 19*  GLUCOSE 89  < > 69*  < > 95 87 106 91  BUN 20  < >  19  < > 20 16.1 22.2 19.3  CALCIUM 9.0  < > 9.1  < > 9.6 9.3 9.4 9.4  CREATININE 1.4  < > 1.22  < > 1.32* 1.2 1.4* 1.3  GFRNONAA  --   --   --   --  51*  --   --   --   GFRAA  --   --   --   --  >60  --   --   --   < > = values in this interval not displayed.  LIVER FUNCTION TESTS:  Recent Labs  05/29/15 1532 06/07/15 0943 07/06/15 0827 08/03/15 0848  BILITOT 0.5 0.40 0.53 0.43  AST 31 24 24 26   ALT 24 29 26 27   ALKPHOS 85 90 86 98  PROT 7.2 7.2 7.4 7.3  ALBUMIN 3.7 3.5 3.5 3.3*    Assessment and Plan: Patient with history of multiple myeloma, currently on lenalidomide maintenance therapy. Plan is for follow-up CT guided bone marrow biopsy today.Risks and benefits discussed with the patient including, but not limited to bleeding,  infection, damage to adjacent structures or low yield requiring additional tests.All of the patient's questions were answered, patient is agreeable to proceed.Consent signed and in chart.     Signed: D. Rowe Robert 08/24/2015, 9:44 AM   I spent a total of 15 minutes at the the patient's bedside AND on the patient's hospital floor or unit, greater than 50% of which was counseling/coordinating care for CT-guided bone marrow biopsy

## 2015-08-24 NOTE — Discharge Instructions (Signed)
Moderate Conscious Sedation, Adult Sedation is the use of medicines to promote relaxation and relieve discomfort and anxiety. Moderate conscious sedation is a type of sedation. Under moderate conscious sedation you are less alert than normal but are still able to respond to instructions or stimulation. Moderate conscious sedation is used during short medical and dental procedures. It is milder than deep sedation or general anesthesia and allows you to return to your regular activities sooner. LET [facility] CARE PROVIDER KNOW ABOUT:   Any allergies you have.  All medicines you are taking, including vitamins, herbs, eye drops, creams, and over-the-counter medicines.  Use of steroids (by mouth or creams).  Previous problems you or members of your family have had with the use of anesthetics.  Any blood disorders you have.  Previous surgeries you have had.  Medical conditions you have.  Possibility of pregnancy, if this applies.  Use of cigarettes, alcohol, or illegal drugs. RISKS AND COMPLICATIONS Generally, this is a safe procedure. However, as with any procedure, problems can occur. Possible problems include:  Oversedation.  Trouble breathing on your own. You may need to have a breathing tube until you are awake and breathing on your own.  Allergic reaction to any of the medicines used for the procedure. BEFORE THE PROCEDURE  You may have blood tests done. These tests can help show how well your kidneys and liver are working. They can also show how well your blood clots.  A physical exam will be done.  Only take medicines as directed by your health care provider. You may need to stop taking medicines (such as blood thinners, aspirin, or nonsteroidal anti-inflammatory drugs) before the procedure.   Do not eat or drink at least 6 hours before the procedure or as directed by your health care provider.  Arrange for a responsible adult, family member, or friend to take you home  after the procedure. He or she should stay with you for at least 24 hours after the procedure, until the medicine has worn off. PROCEDURE   An intravenous (IV) catheter will be inserted into one of your veins. Medicine will be able to flow directly into your body through this catheter. You may be given medicine through this tube to help prevent pain and help you relax.  The medical or dental procedure will be done. AFTER THE PROCEDURE  You will stay in a recovery area until the medicine has worn off. Your blood pressure and pulse will be checked.   Depending on the procedure you had, you may be allowed to go home when you can tolerate liquids and your pain is under control.   This information is not intended to replace advice given to you by your health care provider. Make sure you discuss any questions you have with your health care provider.   Document Released: 05/30/2001 Document Revised: 09/25/2014 Document Reviewed: 05/12/2013 Elsevier Interactive Patient Education 2016 [place]. Bone Marrow Aspiration and Bone Marrow Biopsy, Care After Refer to this sheet in the next few weeks. These instructions provide you with information about caring for yourself after your procedure. Your health care provider may also give you more specific instructions. Your treatment has been planned according to current medical practices, but problems sometimes occur. Call your health care provider if you have any problems or questions after your procedure. WHAT TO EXPECT AFTER THE PROCEDURE After your procedure, it is common to have:  Soreness or tenderness around the puncture site.  Bruising. HOME CARE INSTRUCTIONS  Take  medicines only as directed by your health care provider.  Follow your health care provider's instructions about:  Puncture site care.  Bandage (dressing) changes and removal.  Bathe and shower as directed by your health care provider.  Check your puncture site every day for  signs of infection. Watch for:  Redness, swelling, or pain.  Fluid, blood, or pus.  Return to your normal activities as directed by your health care provider.  Keep all follow-up visits as directed by your health care provider. This is important. SEEK MEDICAL CARE IF:  You have a fever.  You have uncontrollable bleeding.  You have redness, swelling, or pain at the site of your puncture.  You have fluid, blood, or pus coming from your puncture site.   This information is not intended to replace advice given to you by your health care provider. Make sure you discuss any questions you have with your health care provider.   Document Released: 03/24/2005 Document Revised: 01/19/2015 Document Reviewed: 08/26/2014 Elsevier Interactive Patient Education [employer].

## 2015-08-26 ENCOUNTER — Encounter: Payer: Self-pay | Admitting: Oncology

## 2015-08-26 ENCOUNTER — Other Ambulatory Visit: Payer: Self-pay | Admitting: Oncology

## 2015-08-31 ENCOUNTER — Other Ambulatory Visit (HOSPITAL_BASED_OUTPATIENT_CLINIC_OR_DEPARTMENT_OTHER): Payer: Commercial Managed Care - PPO

## 2015-08-31 ENCOUNTER — Telehealth: Payer: Self-pay | Admitting: Oncology

## 2015-08-31 ENCOUNTER — Ambulatory Visit (HOSPITAL_BASED_OUTPATIENT_CLINIC_OR_DEPARTMENT_OTHER): Payer: Commercial Managed Care - PPO | Admitting: Oncology

## 2015-08-31 VITALS — BP 123/76 | HR 66 | Temp 97.9°F | Resp 18 | Ht 70.0 in | Wt 186.8 lb

## 2015-08-31 DIAGNOSIS — D649 Anemia, unspecified: Secondary | ICD-10-CM

## 2015-08-31 DIAGNOSIS — C9002 Multiple myeloma in relapse: Secondary | ICD-10-CM

## 2015-08-31 DIAGNOSIS — C9000 Multiple myeloma not having achieved remission: Secondary | ICD-10-CM

## 2015-08-31 LAB — COMPREHENSIVE METABOLIC PANEL
ALT: 22 U/L (ref 0–55)
ANION GAP: 8 meq/L (ref 3–11)
AST: 23 U/L (ref 5–34)
Albumin: 3.5 g/dL (ref 3.5–5.0)
Alkaline Phosphatase: 96 U/L (ref 40–150)
BILIRUBIN TOTAL: 0.37 mg/dL (ref 0.20–1.20)
BUN: 19.5 mg/dL (ref 7.0–26.0)
CHLORIDE: 105 meq/L (ref 98–109)
CO2: 23 meq/L (ref 22–29)
Calcium: 9.2 mg/dL (ref 8.4–10.4)
Creatinine: 1.4 mg/dL — ABNORMAL HIGH (ref 0.7–1.3)
EGFR: 54 mL/min/{1.73_m2} — AB (ref >90)
GLUCOSE: 74 mg/dL (ref 70–140)
POTASSIUM: 3.8 meq/L (ref 3.5–5.1)
SODIUM: 136 meq/L (ref 136–145)
TOTAL PROTEIN: 7.9 g/dL (ref 6.4–8.3)

## 2015-08-31 LAB — CBC WITH DIFFERENTIAL/PLATELET
BASO%: 0.6 % (ref 0.0–2.0)
Basophils Absolute: 0 10*3/uL (ref 0.0–0.1)
EOS ABS: 0.3 10*3/uL (ref 0.0–0.5)
EOS%: 8.5 % — ABNORMAL HIGH (ref 0.0–7.0)
HCT: 36.7 % — ABNORMAL LOW (ref 38.4–49.9)
HGB: 12.1 g/dL — ABNORMAL LOW (ref 13.0–17.1)
LYMPH%: 20.2 % (ref 14.0–49.0)
MCH: 31.9 pg (ref 27.2–33.4)
MCHC: 33 g/dL (ref 32.0–36.0)
MCV: 96.6 fL (ref 79.3–98.0)
MONO#: 0.4 10*3/uL (ref 0.1–0.9)
MONO%: 10.5 % (ref 0.0–14.0)
NEUT%: 60.2 % (ref 39.0–75.0)
NEUTROS ABS: 2.3 10*3/uL (ref 1.5–6.5)
PLATELETS: 137 10*3/uL — AB (ref 140–400)
RBC: 3.81 10*6/uL — AB (ref 4.20–5.82)
RDW: 16.8 % — ABNORMAL HIGH (ref 11.0–14.6)
WBC: 3.8 10*3/uL — AB (ref 4.0–10.3)
lymph#: 0.8 10*3/uL — ABNORMAL LOW (ref 0.9–3.3)

## 2015-08-31 LAB — TISSUE HYBRIDIZATION (BONE MARROW)-NCBH

## 2015-08-31 LAB — CHROMOSOME ANALYSIS, BONE MARROW

## 2015-08-31 NOTE — Telephone Encounter (Signed)
Appointments made and avs printed °

## 2015-08-31 NOTE — Progress Notes (Signed)
ID: Andre [name]   DOB: May 02, 1951  MR#: [id number]  QMG#:[id number]   PCP: [name] SU: OTHER MD: [name], [name], [address] [facility] ([place]), Ardine Bjork  CHIEF COMPLAINT: Multiple myeloma  CURRENT TREATMENT: stopping lenalidomide maintenance   HISTORY OF PRESENT ILLNESS: From the original intake nodes:  The patient was worked up for peptic ulcer disease in August of 2011, with significant bleeding and anemia.  The patient was Helicobacter pylori negative.  He received some epinephrine when he had his EGD and then started on Protonix.  [name] anemia slowly resolved so that by September, his hemoglobin was up to 10.9 and by earlier this month, his hemoglobin was up to 12.5.    As part of his general workup, he was found to have a slightly elevated globulin fraction.  In September, his total protein was 8.3 with an albumin of 3.8.  In January, the total protein was 8.4 and albumin 3.6.  With persistence of this slight abnormality, Dr. Olevia Perches obtained serum immunofixation and SPEP.  The SPTP showed an M-spike of 2.67 grams.  A total IgG was 4,190.  Total IgA low at 28.  Total IgM low at 28 also.  The immunofixation showed a monoclonal IgG lambda paraprotein.  There were also monoclonal free lambda light chains present. With this information, the patient was referred for further evaluation.   A diagnosis of myeloma was confirmed by bone marrow biopsy and subsequebnt treatment is as detailed below  INTERVAL HISTORY: [name] returns today with his wife [name] for followup of his multiple myeloma. We have continue to follow his protein studies, which have been very stable, the M spike at less than a gram and the Past/lambda ratio in the normal range. He has been on very low-dose lenalidomide, 5 mg daily 1 week on 1 week off. Last week she had a restaging bone marrow biopsy which shows 22% plasma cells in the marrow. His prior biopsy, 2  years ago, showed 4% plasma cells. He is here to discuss those results.  REVIEW OF SYSTEMS: [name] works full-time. He exercises by walking and doing a little running. This is a little less frequent now than before. It is also more walking than running. He is not swimming anymore. He finds that he "keeps a cold" longer than before. This is mostly congestion and sinus symptoms, no fever, no shortness of breath, no green or bloody phlegm. He takes Zyrtec for this. He continues to have "spells". These tend to happen more in the morning. They last about 30 minutes. He sees double and can't concentrate very well. This usually clears without any intervention other than perhaps drinking some water or eating some food. They do not happen every day but perhaps once a week herself. He has a feeling that his right foot doesn't work right and is very careful going up and down steps. He has not stumbled or fall and however. There is no change in his peripheral neuropathy symptoms. He describes himself as fatigued and forgetful. He is not depressed. A detailed review of systems today was otherwise stable  PAST MEDICAL HISTORY: Past Medical History  Diagnosis Date  . Hypertension   . Hyperlipidemia   . Duodenal ulcer   . Allergy   . Thyroid disease   . GERD (gastroesophageal reflux disease)   . Depression   . Anemia   . Hypothyroidism   . Asthma     no treatment x 20 years  . Arthritis   .  Double vision     occurs at times   . Multiple myeloma 07/04/2011  Significant for peptic ulcer disease as noted above.  History of hyperlipidemia.  History of anxiety and depression.  History of GERD which is significantly improved with weight loss and history of reactive airway disease, possibly secondary to the GERD which also improved with weight loss.  There was a history of obesity, now much improved secondary to exercise and diet.   PAST SURGICAL HISTORY: Past Surgical History  Procedure Laterality Date  .  Cardiolite study  11/25/2003    NORMAL  . Bone marrow transplant  2011    for MM  . Tonsillectomy      FAMILY HISTORY Family History  Problem Relation Age of Onset  . Throat cancer Mother   . Hypertension Father   . Stroke Father   . Asthma Father   . Diabetes Father   . Pancreatic cancer Brother   [name] father died at the age of 84 following a stroke.  [name] mother died with cancer of the throat at the age of 74.   She had a history of depression and was a smoker.  Not clear how much alcohol she drank, according to the patient.  The patient had two brothers.  One died from the age of 6 from a "kidney problem."  The second one died at the age of 56 from pancreatic cancer. (This may have been duodenal cancer. 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  His wife of 37+ years was Ellen, but they are now divorced. In 2016 he remarried, his current wife's name is [name].  The patient has two daughters:  Mary Ellen, who works in real estate, and Elizabeth, who is expecting her second child April 2016. .  They both live here in [place]. 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  The patient is very close to his daughters.  Elizabeth's first daughter, Haley, born 03/06/2013, apparently has Weber/Osler/Rendu. The patient is not a church attender.  He does derive a great deal of support from friends at the "[employer]" which he attends very regularly, and also participates in Al-Anon even though actually there is no alcohol or drug problem in the family.  He participates in this group and he gets quite a bit of support from it.    ADVANCED DIRECTIVES: In place  HEALTH MAINTENANCE: (updated 07/16/2013) Social History  Substance Use Topics  . Smoking status: Former Smoker -- 3 years    Quit  date: 03/27/1969  . Smokeless tobacco: Never Used  . Alcohol Use: No     Colonoscopy:  PSA:  Sept 2014, normal/Dr. [name]  Lipid panel: Sept 2014/Dr. [name]   Allergies  Allergen Reactions  . Rosuvastatin     Other reaction(s): Liver Disorder  . Crestor [Rosuvastatin Calcium]     ADVERSE EFFECTS ON LIVER  . Lipitor [Atorvastatin Calcium]     Current Outpatient Prescriptions  Medication Sig Dispense Refill  . Acetaminophen (TYLENOL 8 HOUR ARTHRITIS PAIN PO) Take by mouth.    . aspirin EC 81 MG tablet Take 81 mg by mouth every morning.    . benzonatate (TESSALON) 200 MG capsule Take 1 capsule (200 mg total) by mouth 2 (two) times daily as needed for cough. 30 capsule 0  .   Bisacodyl (DULCOLAX PO) Take 1 tablet by mouth daily as needed (for constipation).     . cetirizine (ZYRTEC) 10 MG tablet Take 10 mg by mouth daily.    . cholecalciferol (VITAMIN D) 1000 UNITS tablet Take 1 tablet (1,000 Units total) by mouth daily. 100 tablet 3  . escitalopram (LEXAPRO) 10 MG tablet take 1 tablet by mouth once daily 30 tablet 5  . levothyroxine (SYNTHROID, LEVOTHROID) 150 MCG tablet Take 1 tablet by mouth  daily 90 tablet 3  . naproxen (NAPROSYN) 250 MG tablet Take by mouth 2 (two) times daily with a meal.    . omeprazole (PRILOSEC) 40 MG capsule Take 1 capsule (40 mg total) by mouth every morning. 90 capsule 3  . sildenafil (VIAGRA) 100 MG tablet Take 1 tablet (100 mg total) by mouth as needed for erectile dysfunction. 12 tablet 11  . [DISCONTINUED] fexofenadine (ALLEGRA) 60 MG tablet Take 180 mg by mouth daily.       No current facility-administered medications for this visit.    OBJECTIVE: Middle-aged white man who appears stated age Filed Vitals:   08/31/15 0958  BP: 123/76  Pulse: 66  Temp: 97.9 F (36.6 C)  Resp: 18   Body mass index is 26.8 kg/(m^2).  Sclerae unicteric, pupils round and equal, extraocular movements, mild bilateral arcus senilis Oropharynx clear and  moist-- no thrush or other lesions, good dentition No cervical or supraclavicular adenopathy Lungs no rales or rhonchi Heart regular rate and rhythm Abd soft, nontender, positive bowel sounds MSK no focal spinal tenderness, no joint edema Neuro: nonfocal, , with pedal dorsiflexion bilaterally 5 over 5, well oriented, appropriate affect  LAB RESULTS:  Lab Results  Component Value Date   WBC 3.8* 08/31/2015   NEUTROABS 2.3 08/31/2015   HGB 12.1* 08/31/2015   HCT 36.7* 08/31/2015   MCV 96.6 08/31/2015   PLT 137* 08/31/2015      Chemistry      Component Value Date/Time   NA 136 08/31/2015 0933   NA 137 05/29/2015 1532   K 3.8 08/31/2015 0933   K 5.0 05/29/2015 1532   CL 104 05/29/2015 1532   CL 107 03/10/2013 1352   CO2 23 08/31/2015 0933   CO2 22 05/29/2015 1532   BUN 19.5 08/31/2015 0933   BUN 20 05/29/2015 1532   CREATININE 1.4* 08/31/2015 0933   CREATININE 1.32* 05/29/2015 1532      Component Value Date/Time   CALCIUM 9.2 08/31/2015 0933   CALCIUM 9.6 05/29/2015 1532   ALKPHOS 96 08/31/2015 0933   ALKPHOS 85 05/29/2015 1532   AST 23 08/31/2015 0933   AST 31 05/29/2015 1532   ALT 22 08/31/2015 0933   ALT 24 05/29/2015 1532   BILITOT 0.37 08/31/2015 0933   BILITOT 0.5 05/29/2015 1532    Results for Alejo,  J (MRN [id number]) as of 08/31/2015 10:26  Ref. Range 01/19/2015 08:13 02/16/2015 09:03 03/16/2015 08:42 04/13/2015 08:51 05/11/2015 08:50  Gamma Globulin Latest Ref Range: 0.8-1.7 g/dL 1.3 1.4 1.4 1.5 1.4  SPE Interp. Unknown * * * * *  Total Protein, Serum Electrophoresis Latest Ref Range: 6.1-8.1 g/dL 6.6 7.1 6.7 7.1 7.0  Kappa free light chain Latest Ref Range: 0.33-1.94 mg/dL 3.94 (H) 4.07 (H) 4.30 (H) 3.87 (H) 3.73 (H)  Lambda Free Lght Chn Latest Ref Range: 0.57-2.63 mg/dL 13.40 (H) 12.70 (H) 14.30 (H) 17.20 (H) 14.40 (H)  Kappa:Lambda Ratio Latest Ref Range: 0.26-1.65  0.29 0.32 0.30 0.23 (L) 0.26    Collected: 05/11/15   0850   Resulting lab: [facility]   Value: *   Comment: A restricted band consistent with monoclonal protein is present.The monoclonal protein peak accounts for 0.9 g/dL of the total1.4 g/dL of protein in the gamma region. Results are consistent with SPE performed on 04/14/2015 Reviewed by Janice J. Hessling,    Patient: Pursel,  J Collected: 08/24/2015 Client: [facility] Accession: [id number] Received: 08/24/2015 D. Daniel Hassell DOB: 07/08/1951 Age: 64 Gender: M Reported: 08/26/2015 [address] Patient Ph: [phone] MRN #: [id number] [place], [place] [address] Visit #: [id number] Chart #: Phone: [phone] Fax: CC: Gustav Magrinat, MD BONE MARROW REPORT FINAL DIAGNOSIS Diagnosis Bone Marrow, Aspirate,Biopsy, and Clot, mult myeloma post treatment - HYPERCELLULAR BONE MARROW FOR AGE WITH PLASMA CELL NEOPLASM. - SEE COMMENT. PERIPHERAL BLOOD: - MILD NORMOCYTIC-NORMOCHROMIC ANEMIA - THROMBOCYTOPENIA. Diagnosis Note The bone marrow shows increased number of atypical plasma cells representing 22% of all cells in the aspirate associated with interstitial infiltrates and numerous variably sized clusters in the clot and biopsy sections. Immunohistochemical stains show that the plasma cells display lambda light chain restriction/excess consistent with persistent/recurrent plasma cell neoplasm. There is a background of trilineage hematopoiesis with nonspecific changes. Correlation with cytogenetic and FISH studies is recommended. (BNS:kh:ecj 08-26-15) STUDIES: Ct Biopsy  08/24/2015  CLINICAL DATA:  Multiple myeloma, post treatment. EXAM: CT GUIDED DEEP ILIAC BONE ASPIRATION AND CORE BIOPSY TECHNIQUE: The procedure, risks (including but not limited to bleeding, infection, organ damage ), benefits, and alternatives were explained to the patient. Questions regarding the procedure were encouraged and answered. The patient understands and consents to the procedure. Patient was placed supine on the CT  gantry and limited axial scans through the pelvis were obtained. Appropriate skin entry site was identified. Skin site was marked, prepped with Betadine, draped in usual sterile fashion, and infiltrated locally with 1% lidocaine. Intravenous Fentanyl and Versed were administered as conscious sedation during continuous cardiorespiratory monitoring by the radiology RN, with a total moderate sedation time of 11 minutes. Under CT fluoroscopic guidance an 11-gauge Cook trocar bone needle was advanced into the right iliac bone just lateral to the sacroiliac joint. Once needle tip position was confirmed, coaxial core and aspiration samples were obtained. The final sample was obtained using the guiding needle itself, which was then removed. Two additional core samples were then obtained at the request of the pathology tech. Post procedure scans show no hematoma or fracture. Patient tolerated procedure well. COMPLICATIONS: COMPLICATIONS none IMPRESSION: 1. Technically successful CT guided right iliac bone core and aspiration biopsy. Electronically Signed   By: D  Hassell M.D.   On: 08/24/2015 15:42    ASSESSMENT: 64 y.o.  [place] man with a history of multiple myeloma diagnosed February of 2012, with an initial M-spike of 2.6 g/dL, IFE showing an IgG lambda paraprotein and free lambda light chains in the urine. Cytogenetics showed trisomy 11. Bone marrow biopsy showed a 22% plasmacytosis. Treated with   (1) bortezomib (subcutaneously), lenalidomide, and dexamethasone, with repeat bone marrow biopsy May of 2012 showing 10% plasmacytosis   (2) high-dose chemotherapy with BCNU and melphalan at [facility], followed by stem cell rescue July of 2012   (3) on zoledronic acid started December of 2012, initially monthly, currently given every 3 months, most recent dose 02/17/2014  (4) low-dose lenalidomide resumed April 2013, interrupted several times since.  Resumed again on 02/19/2013, and a dose of 5 mg daily, 21 days on and  7 days off, later further reduced to 7 days   on, 7 days off  (5) CNS symptoms and abnormal brain MRI extensively evaluated by neurology with no definitive diagnosis established; improving   (6) rising M spike noted June 2014 but did not meet criteria for carfilzomib study  (7) on lenalidomide 25 mg daily, 14 days on, 7 days off, starting 04/18/2013, interrupted December 2014 because of rash;   (a) resumed January 2015 at 10 mg/ day at 21 days on/ 7 days off  (Beck) starting 08/18/2014 decreasedto 10 mg/ day 14 days on, 7 days off because of cytopenias  (c) as of February 2016 he has been on 5 mg daily 7 days on 7 days off  (8) transient global amnesia 05/29/2015, resolved without intervention   PLAN:  [name]'s protein studies are very stable on his current lenalidomide dose, which is minimal. His counts are holding up well. The fly in the ointment is the 20% plasmacytosis in his bone marrow biopsy.  Certainly one option would be to continue as we are doing and repeat a bone marrow biopsy in 3 months. However I would like him to visit again with Dr. Alvie Heidelberg and see if he qualifies for some of the studies (unfortunately not available here in [place]) that are bringing in some of the more exciting myeloma drugs into the clinic. He is agreeable to that, so I have asked him to go ahead and stop the lenalidomide at this point.  We are obtaining a bone survey within a week and he will also receive a dose of zolendronate this week.  I don't have an explanation for his "spells" described above. If they are due to the lenalidomide, which I doubt, she should notice a change in either frequency or intensity or both so he will keep an eye out for that.  Otherwise I have encouraged him to intensify his exercise program. He is going to return to see me in February. He knows to call for any problems that may develop before that visit.  [name]  Chauncey Cruel, MD     08/31/2015

## 2015-09-06 ENCOUNTER — Ambulatory Visit (HOSPITAL_COMMUNITY)
Admission: RE | Admit: 2015-09-06 | Discharge: 2015-09-06 | Disposition: A | Discharge status: Home / Self Care | Payer: Commercial Managed Care - PPO | Source: Ambulatory Visit | Attending: Oncology | Admitting: Oncology

## 2015-09-06 ENCOUNTER — Ambulatory Visit (HOSPITAL_BASED_OUTPATIENT_CLINIC_OR_DEPARTMENT_OTHER): Payer: Commercial Managed Care - PPO

## 2015-09-06 VITALS — BP 116/69 | HR 65 | Temp 97.8°F | Resp 18

## 2015-09-06 DIAGNOSIS — C9002 Multiple myeloma in relapse: Secondary | ICD-10-CM

## 2015-09-06 DIAGNOSIS — M899 Disorder of bone, unspecified: Secondary | ICD-10-CM | POA: Insufficient documentation

## 2015-09-06 DIAGNOSIS — M4854XA Collapsed vertebra, not elsewhere classified, thoracic region, initial encounter for fracture: Secondary | ICD-10-CM | POA: Insufficient documentation

## 2015-09-06 DIAGNOSIS — X58XXXA Exposure to other specified factors, initial encounter: Secondary | ICD-10-CM | POA: Insufficient documentation

## 2015-09-06 MED ORDER — ZOLEDRONIC ACID 4 MG/100ML IV SOLN
4.0000 mg | Freq: Once | INTRAVENOUS | Status: AC
  Administered 2015-09-06: 4 mg via INTRAVENOUS
  Filled 2015-09-06: qty 100

## 2015-09-06 MED ORDER — SODIUM CHLORIDE 0.9 % IV SOLN
Freq: Once | INTRAVENOUS | Status: AC
  Administered 2015-09-06: 09:00:00 via INTRAVENOUS

## 2015-09-06 NOTE — Patient Instructions (Signed)

## 2015-09-08 ENCOUNTER — Telehealth: Payer: Self-pay | Admitting: *Deleted

## 2015-09-08 ENCOUNTER — Encounter: Payer: Self-pay | Admitting: Oncology

## 2015-09-08 NOTE — Telephone Encounter (Signed)
Patient called requesting results of 09-06-2015 Bone Survey. Return number [phone]."    Will notify Dr. [name] of this request. IMPRESSION: Mild progression of numerous lytic lesions within the calvarium.  New mild compression fracture at T12.  Lytic lesions within the femurs and humeri bilaterally appear stable.  Subtle small lytic lesions within the left radius. This was not imaged on prior study.   Electronically Signed  By: Rolm Baptise M.D.  On: 09/06/2015 10:29

## 2015-09-09 ENCOUNTER — Encounter (HOSPITAL_COMMUNITY): Payer: Self-pay

## 2015-09-11 ENCOUNTER — Other Ambulatory Visit: Payer: Self-pay | Admitting: Oncology

## 2015-09-11 NOTE — Progress Notes (Unsigned)
Calles pt w results of bone surbvery. He will see Dr Alvie Heidelberg Jan 5 and we will change Rx after that visit basedon her recommendations

## 2015-09-15 ENCOUNTER — Other Ambulatory Visit: Payer: Self-pay | Admitting: *Deleted

## 2015-09-15 NOTE — Telephone Encounter (Signed)
This RN faxed back unsigned prescription for revlimid to [facility] mail order pharmacy with written statement that this medication is currently on hold.

## 2015-09-27 NOTE — Telephone Encounter (Signed)
Progress Notes   Andre [name] (MR# [id number])      Progress Notes Info    Author Note Status Last Update User Last Update Date/Time   Chauncey Cruel, MD Sign at close encounter Chauncey Cruel, MD 09/11/2015 8:52 AM    Progress Notes    Expand All Collapse All   Calles pt w results of bone surbvery. He will see Dr Alvie Heidelberg Jan 5 and we will change Rx after that visit basedon her recommendations

## 2015-10-01 ENCOUNTER — Encounter: Payer: Self-pay | Admitting: Oncology

## 2015-10-05 NOTE — Telephone Encounter (Signed)
Pt is getting anxious to hear about treatment plan. My chart message forwarded to Dr Doris Cheadle. Pt has been to [facility], needs treatment prior to transplant.

## 2015-10-06 ENCOUNTER — Other Ambulatory Visit: Payer: Self-pay | Admitting: Oncology

## 2015-10-06 ENCOUNTER — Telehealth: Payer: Self-pay | Admitting: Oncology

## 2015-10-06 DIAGNOSIS — C9002 Multiple myeloma in relapse: Secondary | ICD-10-CM

## 2015-10-06 MED ORDER — DEXAMETHASONE 6 MG PO TABS: ORAL_TABLET | ORAL | Status: DC

## 2015-10-06 MED ORDER — ASPIRIN EC 81 MG PO TBEC: 81.0000 mg | DELAYED_RELEASE_TABLET | Freq: Every day | ORAL | Status: DC

## 2015-10-06 MED ORDER — POMALIDOMIDE 4 MG PO CAPS: ORAL_CAPSULE | ORAL | Status: DC

## 2015-10-06 NOTE — Telephone Encounter (Signed)
Spoke with patient and gave appointments for lab and treatments.

## 2015-10-06 NOTE — Progress Notes (Unsigned)
Andre [name] was evaluated again at [facility] by Dr. Alvie Heidelberg and she is suggesting a second transplant. Of course he will need pretransplant remission induction and she is suggesting pomalidomide, dexamethasone, and bortezomib.  The specific doses are pomalidomide 4 mg daily 14 days on, 7 days off, Velcade given on days 1, 4, 8, and 11 of each 21 day cycle, and dexamethasone at 20 mg given on days 1, 2, 4, 5, 8, 9, and 11, 12 of each 21 day cycle.  I have called Andre [name] and let them know of the plan. He is in agreement. He will see me on Fabry seventh and that will be day 1 cycle 1. After 3 cycles she will be restaged and hopefully can proceed to transplantation.

## 2015-10-07 ENCOUNTER — Other Ambulatory Visit: Payer: Self-pay | Admitting: *Deleted

## 2015-10-07 ENCOUNTER — Other Ambulatory Visit: Payer: Self-pay | Admitting: Oncology

## 2015-10-07 MED ORDER — POMALIDOMIDE 4 MG PO CAPS: ORAL_CAPSULE | ORAL | Status: DC

## 2015-10-07 MED ORDER — ESCITALOPRAM OXALATE 10 MG PO TABS: 10.0000 mg | ORAL_TABLET | Freq: Every day | ORAL | Status: DC

## 2015-10-12 ENCOUNTER — Other Ambulatory Visit: Payer: Self-pay | Admitting: Oncology

## 2015-10-15 ENCOUNTER — Other Ambulatory Visit: Payer: Self-pay | Admitting: Oncology

## 2015-10-15 ENCOUNTER — Encounter: Payer: Self-pay | Admitting: Oncology

## 2015-10-15 DIAGNOSIS — C9002 Multiple myeloma in relapse: Secondary | ICD-10-CM

## 2015-10-15 MED ORDER — DEXAMETHASONE 6 MG PO TABS: ORAL_TABLET | ORAL | Status: DC

## 2015-10-15 NOTE — Progress Notes (Signed)
Per [facility] pomalyst approved 10/13/15-10/12/16. [place] [id number]. I sent to medical records

## 2015-10-17 ENCOUNTER — Other Ambulatory Visit: Payer: Self-pay | Admitting: Oncology

## 2015-10-17 ENCOUNTER — Encounter: Payer: Self-pay | Admitting: Oncology

## 2015-10-19 ENCOUNTER — Telehealth: Payer: Self-pay | Admitting: Oncology

## 2015-10-19 ENCOUNTER — Other Ambulatory Visit (HOSPITAL_BASED_OUTPATIENT_CLINIC_OR_DEPARTMENT_OTHER): Payer: Commercial Managed Care - PPO

## 2015-10-19 DIAGNOSIS — C9000 Multiple myeloma not having achieved remission: Secondary | ICD-10-CM

## 2015-10-19 DIAGNOSIS — C9002 Multiple myeloma in relapse: Secondary | ICD-10-CM

## 2015-10-19 LAB — COMPREHENSIVE METABOLIC PANEL
ALT: 23 U/L (ref 0–55)
ANION GAP: 7 meq/L (ref 3–11)
AST: 23 U/L (ref 5–34)
Albumin: 3.5 g/dL (ref 3.5–5.0)
Alkaline Phosphatase: 78 U/L (ref 40–150)
BUN: 20.8 mg/dL (ref 7.0–26.0)
CHLORIDE: 110 meq/L — AB (ref 98–109)
CO2: 18 meq/L — AB (ref 22–29)
CREATININE: 1.2 mg/dL (ref 0.7–1.3)
Calcium: 9.6 mg/dL (ref 8.4–10.4)
EGFR: 64 mL/min/{1.73_m2} — ABNORMAL LOW (ref >90)
Glucose: 102 mg/dl (ref 70–140)
Potassium: 4.1 mEq/L (ref 3.5–5.1)
Sodium: 136 mEq/L (ref 136–145)
Total Bilirubin: 0.35 mg/dL (ref 0.20–1.20)
Total Protein: 7.7 g/dL (ref 6.4–8.3)

## 2015-10-19 LAB — CBC WITH DIFFERENTIAL/PLATELET
BASO%: 0.3 % (ref 0.0–2.0)
BASOS ABS: 0 10*3/uL (ref 0.0–0.1)
EOS ABS: 0.1 10*3/uL (ref 0.0–0.5)
EOS%: 2.2 % (ref 0.0–7.0)
HCT: 32.6 % — ABNORMAL LOW (ref 38.4–49.9)
HGB: 11.2 g/dL — ABNORMAL LOW (ref 13.0–17.1)
LYMPH%: 29.3 % (ref 14.0–49.0)
MCH: 32.2 pg (ref 27.2–33.4)
MCHC: 34.4 g/dL (ref 32.0–36.0)
MCV: 93.7 fL (ref 79.3–98.0)
MONO#: 0.6 10*3/uL (ref 0.1–0.9)
MONO%: 16.6 % — AB (ref 0.0–14.0)
NEUT#: 1.9 10*3/uL (ref 1.5–6.5)
NEUT%: 51.6 % (ref 39.0–75.0)
Platelets: 121 10*3/uL — ABNORMAL LOW (ref 140–400)
RBC: 3.48 10*6/uL — AB (ref 4.20–5.82)
RDW: 16.3 % — AB (ref 11.0–14.6)
WBC: 3.7 10*3/uL — ABNORMAL LOW (ref 4.0–10.3)
lymph#: 1.1 10*3/uL (ref 0.9–3.3)

## 2015-10-19 NOTE — Telephone Encounter (Signed)
Called and left a message with 2/3 md visit

## 2015-10-20 LAB — PROTEIN / CREATININE RATIO, URINE
CREATININE, UR: 35.5 mg/dL
PROTEIN,TOTAL,URINE: 5.1 mg/dL
PROTEIN/CREAT RATIO: 144 mg/g{creat} (ref 0–200)

## 2015-10-20 LAB — KAPPA/LAMBDA LIGHT CHAINS
Ig Kappa Free Light Chain: 23.04 mg/L — ABNORMAL HIGH (ref 3.30–19.40)
Ig Lambda Free Light Chain: 121.32 mg/L — ABNORMAL HIGH (ref 5.71–26.30)
KAPPA/LAMBDA FLC RATIO: 0.19 — AB (ref 0.26–1.65)

## 2015-10-21 LAB — PROTEIN ELECTROPHORESIS, SERUM
A/G RATIO SPE: 0.9 (ref 0.7–1.7)
ALBUMIN: 3.3 g/dL (ref 2.9–4.4)
ALPHA 2: 0.6 g/dL (ref 0.4–1.0)
Alpha 1: 0.2 g/dL (ref 0.0–0.4)
BETA: 1.1 g/dL (ref 0.7–1.3)
Gamma Globulin: 1.7 g/dL (ref 0.4–1.8)
Globulin, Total: 3.7 g/dL (ref 2.2–3.9)
M-Spike, %: 1.3 g/dL — ABNORMAL HIGH
Total Protein: 7 g/dL (ref 6.0–8.5)

## 2015-10-22 ENCOUNTER — Ambulatory Visit (HOSPITAL_BASED_OUTPATIENT_CLINIC_OR_DEPARTMENT_OTHER): Payer: Commercial Managed Care - PPO | Admitting: Oncology

## 2015-10-22 ENCOUNTER — Telehealth: Payer: Self-pay | Admitting: Oncology

## 2015-10-22 VITALS — BP 142/86 | HR 77 | Temp 97.5°F | Resp 18 | Ht 70.0 in | Wt 186.3 lb

## 2015-10-22 DIAGNOSIS — C9002 Multiple myeloma in relapse: Secondary | ICD-10-CM | POA: Diagnosis not present

## 2015-10-22 MED ORDER — VALACYCLOVIR HCL 1 G PO TABS: 1000.0000 mg | ORAL_TABLET | Freq: Every day | ORAL | Status: DC

## 2015-10-22 MED ORDER — ASPIRIN EC 325 MG PO TBEC: 325.0000 mg | DELAYED_RELEASE_TABLET | Freq: Every day | ORAL | Status: DC

## 2015-10-22 MED ORDER — ZOLPIDEM TARTRATE 5 MG PO TABS: 5.0000 mg | ORAL_TABLET | Freq: Every evening | ORAL | Status: DC | PRN

## 2015-10-22 NOTE — Telephone Encounter (Signed)
Appointments made and avs printed °

## 2015-10-22 NOTE — Progress Notes (Signed)
ID: [name]   DOB: February 17, 1951  MR#: [id number]  EYE#:[id number]   PCP: [name] SU: OTHER MD: [name], [name], [address] [place] [phone]), Ardine Bjork  CHIEF COMPLAINT: Multiple myeloma  CURRENT TREATMENT: Bortezomib, pomalidomide, dexamethasone   HISTORY OF PRESENT ILLNESS: From the original intake nodes:  The patient was worked up for peptic ulcer disease in August of 2011, with significant bleeding and anemia.  The patient was Helicobacter pylori negative.  He received some epinephrine when he had his EGD and then started on Protonix.  [name] anemia slowly resolved so that by September, his hemoglobin was up to 10.9 and by earlier this month, his hemoglobin was up to 12.5.    As part of his general workup, he was found to have a slightly elevated globulin fraction.  In September, his total protein was 8.3 with an albumin of 3.8.  In January, the total protein was 8.4 and albumin 3.6.  With persistence of this slight abnormality, Dr. Olevia Perches obtained serum immunofixation and SPEP.  The SPTP showed an M-spike of 2.67 grams.  A total IgG was 4,190.  Total IgA low at 28.  Total IgM low at 28 also.  The immunofixation showed a monoclonal IgG lambda paraprotein.  There were also monoclonal free lambda light chains present. With this information, the patient was referred for further evaluation.   A diagnosis of myeloma was confirmed by bone marrow biopsy and subsequebnt treatment is as detailed below  INTERVAL HISTORY: Andre [name] returns today for follow-up of his relapsed multiple myeloma accompanied by his wife Andre [name]. We were able to get approval of his pomalidomide and he has had in hand. He is scheduled to start the bortezomib next Tuesday. He is here today to discuss the possible side effects, toxicities and complications of these agents.  REVIEW OF SYSTEMS: Andre [name] has been pretty much staying home except to go to work. He has  gone fishing a couple of times. He is afraid of crowds. He just doesn't want to get sick. He has insomnia problems which are not new and which were actually helped by the lenalidomide, so he is hoping the pomalidomide will be equally helpful there. He has mild ringing in his ears. He does have some back pain which is very variable. Yesterday he did 30 minutes and a recumbent bike and after that his back heard a little bit more. He feels anxious but not depressed. He is a little bit forgetful. His wife feels is less forgetful and he wants. Otherwise a detailed review of systems today was noncontributory  PAST MEDICAL HISTORY: Past Medical History  Diagnosis Date  . Hypertension   . Hyperlipidemia   . Duodenal ulcer   . Allergy   . Thyroid disease   . GERD (gastroesophageal reflux disease)   . Depression   . Anemia   . Hypothyroidism   . Asthma     no treatment x 20 years  . Arthritis   . Double vision     occurs at times   . Multiple myeloma 07/04/2011  Significant for peptic ulcer disease as noted above.  History of hyperlipidemia.  History of anxiety and depression.  History of GERD which is significantly improved with weight loss and history of reactive airway disease, possibly secondary to the GERD which also improved with weight loss.  There was a history of obesity, now much improved secondary to exercise and diet.   PAST SURGICAL HISTORY: Past Surgical History  Procedure Laterality Date  . Cardiolite study  11/25/2003    NORMAL  . Bone marrow transplant  2011    for MM  . Tonsillectomy      FAMILY HISTORY Family History  Problem Relation Age of Onset  . Throat cancer Mother   . Hypertension Father   . Stroke Father   . Asthma Father   . Diabetes Father   . Pancreatic cancer Brother   [name] father died at the age of 65 following a stroke.  [name] mother died with cancer of the throat at the age of 45.   She had a history of depression and was a smoker.  Not  clear how much alcohol she drank, according to the patient.  The patient had two brothers.  One died from the age of 68 from a "kidney problem."  The second one died at the age of 22 from pancreatic cancer. (This may have been duodenal cancer. 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  His wife of 37+ years was [name], but they are now divorced. In 2016 he remarried, his current wife's name is [name].  The patient has two daughters:  Andre [name], who works in [place], who is expecting her second child April 2016. .  They both live here in [place]. 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  The patient is very close to his daughters.  [place] first daughter, Andre [name], born 03/06/2013, apparently has Weber/Osler/Rendu. The patient is not a church attender.  He does derive a great deal of support from friends at the "[employer]" which he attends very regularly, and also participates in Al-Anon even though actually there is no alcohol or drug problem in the family.  He participates in this group and he gets quite a bit of support from it.     ADVANCED DIRECTIVES: In place  HEALTH MAINTENANCE: (updated 07/16/2013) Social History  Substance Use Topics  . Smoking status: Former Smoker -- 3 years    Quit date: 03/27/1969  . Smokeless tobacco: Never Used  . Alcohol Use: No     Colonoscopy:  PSA:  Sept 2014, normal/Dr. [name]  Lipid panel: Sept 2014/Dr. [name]   Allergies  Allergen Reactions  . Rosuvastatin     Other reaction(s): Liver Disorder  . Crestor [Rosuvastatin Calcium]     ADVERSE EFFECTS ON LIVER  . Lipitor [Atorvastatin Calcium]     Current Outpatient Prescriptions  Medication Sig Dispense Refill  . Acetaminophen (TYLENOL 8 HOUR ARTHRITIS PAIN PO) Take by mouth.     [name] aspirin EC 81 MG tablet Take 81 mg by mouth every morning.    [name] aspirin EC 81 MG tablet Take 1 tablet (81 mg total) by mouth daily. 100 tablet 4  . benzonatate (TESSALON) 200 MG capsule Take 1 capsule (200 mg total) by mouth 2 (two) times daily as needed for cough. 30 capsule 0  . Bisacodyl (DULCOLAX PO) Take 1 tablet by mouth daily as needed (for constipation).     . cetirizine (ZYRTEC) 10 MG tablet Take 10 mg by mouth daily.    . cholecalciferol (VITAMIN D) 1000 UNITS tablet Take 1 tablet (1,000 Units total) by mouth daily. 100 tablet 3  . dexamethasone (DECADRON) 6 MG tablet Take 3 tablets by mouth with  food as directed 24 tablet 4  . escitalopram (LEXAPRO) 10 MG tablet Take 1 tablet (10 mg total) by mouth daily. 30 tablet 5  . levothyroxine (SYNTHROID, LEVOTHROID) 150 MCG tablet Take 1 tablet by mouth  daily 90 tablet 3  . naproxen (NAPROSYN) 250 MG tablet Take by mouth 2 (two) times daily with a meal.    . omeprazole (PRILOSEC) 40 MG capsule Take 1 capsule (40 mg total) by mouth every morning. 90 capsule 3  . pomalidomide (POMALYST) 4 MG capsule Take 1 capsule by mouth with water daily on days 1-14. Repeat every 21 days. 14 capsule 4  . sildenafil (VIAGRA) 100 MG tablet Take 1 tablet (100 mg total) by mouth as needed for erectile dysfunction. 12 tablet 11  . [DISCONTINUED] fexofenadine (ALLEGRA) 60 MG tablet Take 180 mg by mouth daily.       No current facility-administered medications for this visit.    OBJECTIVE: Middle-aged white man in no acute distress Filed Vitals:   10/22/15 1337  BP: 142/86  Pulse: 77  Temp: 97.5 F (36.4 C)  Resp: 18   Body mass index is 26.73 kg/(m^2).  Sclerae unicteric, EOMs intact Oropharynx clear, dentition in good repair No cervical or supraclavicular adenopathy Lungs no rales or rhonchi Heart regular rate and rhythm Abd soft, nontender, positive bowel sounds MSK no focal spinal tenderness, no upper extremity lymphedema Neuro: nonfocal,  well oriented, appropriate affect   LAB RESULTS:  Lab Results  Component Value Date   WBC 3.7* 10/19/2015   NEUTROABS 1.9 10/19/2015   HGB 11.2* 10/19/2015   HCT 32.6* 10/19/2015   MCV 93.7 10/19/2015   PLT 121* 10/19/2015      Chemistry      Component Value Date/Time   NA 136 10/19/2015 0940   NA 137 05/29/2015 1532   K 4.1 10/19/2015 0940   K 5.0 05/29/2015 1532   CL 104 05/29/2015 1532   CL 107 03/10/2013 1352   CO2 18* 10/19/2015 0940   CO2 22 05/29/2015 1532   BUN 20.8 10/19/2015 0940   BUN 20 05/29/2015 1532   CREATININE 1.2 10/19/2015 0940   CREATININE 1.32* 05/29/2015 1532      Component Value Date/Time   CALCIUM 9.6 10/19/2015 0940   CALCIUM 9.6 05/29/2015 1532   ALKPHOS 78 10/19/2015 0940   ALKPHOS 85 05/29/2015 1532   AST 23 10/19/2015 0940   AST 31 05/29/2015 1532   ALT 23 10/19/2015 0940   ALT 24 05/29/2015 1532   BILITOT 0.35 10/19/2015 0940   BILITOT 0.5 05/29/2015 1532    Results for QUADRE, BRISTOL (MRN [id number]) as of 10/22/2015 15:17  Ref. Range 08/04/2014 08:01 09/15/2014 08:11 10/27/2014 08:31 11/24/2014 07:55 10/19/2015 09:40  M-SPIKE, % Latest Ref Range: Not Observed g/dL 0.68 0.67 0.69 0.70 1.3 (H)   STUDIES: No results found.  ASSESSMENT: 65 y.o.  [place] man with a history of multiple myeloma diagnosed February of 2012, with an initial M-spike of 2.6 g/dL, IFE showing an IgG lambda paraprotein and free lambda light chains in the urine. Cytogenetics showed trisomy 23. Bone marrow biopsy showed a 22% plasmacytosis. Treated with   (1) bortezomib (subcutaneously), lenalidomide, and dexamethasone, with repeat bone marrow biopsy May of 2012 showing 10% plasmacytosis   (2) high-dose chemotherapy with BCNU and melphalan at [facility], followed by stem cell rescue July of 2012   (3) on zoledronic acid started December of 2012, initially monthly, currently given every 3 months, most recent dose 02/17/2014  (4)  low-dose lenalidomide resumed April  2013, interrupted several times since.  Resumed again on 02/19/2013, and a dose of 5 mg daily, 21 days on and 7 days off, later further reduced to 7 days on, 7 days off  (5) CNS symptoms and abnormal brain MRI extensively evaluated by neurology with no definitive diagnosis established; improving   (6) rising M spike noted June 2014 but did not meet criteria for carfilzomib study  (7) on lenalidomide 25 mg daily, 14 days on, 7 days off, starting 04/18/2013, interrupted December 2014 because of rash;   (a) resumed January 2015 at 10 mg/ day at 21 days on/ 7 days off  ([place]) starting 08/18/2014 decreasedto 10 mg/ day 14 days on, 7 days off because of cytopenias  (c) as of February 2016 he has been on 5 mg daily 7 days on 7 days off  (d) lenalidomide discontinued December 2016 with evidence of disease progression  (8) transient global amnesia 05/29/2015, resolved without intervention  (9) starting PVD 10/25/2015 w ASA 325 thromboprophylaxis, valacyclovir prophylaxis  (a) pomalidomide 4 mg/d days 1-14  ([place]) bortezomib sQ days 2,5,9,,12 of each 21 day cycle  (c) dexamethasome 20 mg two days a week   PLAN:  Andre [name] is ready to start his treatment 2 try to get into remission so he can follow-up with a second autologous transplant. We are going to start the pomalidomide 10/25/2015. Today we discussed the possible toxicities, side effects and complications. He will take this drug daily for 14 days, repeating every 21 days.  He is starting the bortezomib on 10/26/2015, repeating Tuesday and Friday of the first 2 weeks and then off the third week. Of course this repeats every 21 days as well.  We negotiated on the dexamethasone. Here ready has problems with insomnia. We are going to do the dexamethasone at 20 mg on Tuesdays and Thursdays only(in the MM007 protocol it was 4 days out of every 9) and he may take Ambien in those nights. I discouraged his taking Ambien more frequently than that because he can develop  tolerance. We are upping his aspirin to 325 as of today and he was told far to Junior today as well.  We will be following his myeloma labs every 3 weeks with the next set do February 21. I'm going to see him next week just to make sure there are no unusual side effects to start with and of course we will follow his counts on a weekly basis as well.  He is scheduled to see Dr. Alvie Heidelberg again in mid April. He will probably complete his 12 weeks of treatment here late April before proceeding to his transplant sometime in May most likely.  Andre [name] has a good understanding of this plan. He agrees with it. He will call with any problems that may develop before his next visit here.   Chauncey Cruel, MD     10/22/2015

## 2015-10-25 ENCOUNTER — Other Ambulatory Visit: Payer: Self-pay | Admitting: Nurse Practitioner

## 2015-10-26 ENCOUNTER — Ambulatory Visit: Payer: Commercial Managed Care - PPO

## 2015-10-26 ENCOUNTER — Telehealth: Payer: Self-pay | Admitting: Oncology

## 2015-10-26 ENCOUNTER — Ambulatory Visit (HOSPITAL_BASED_OUTPATIENT_CLINIC_OR_DEPARTMENT_OTHER): Payer: Commercial Managed Care - PPO

## 2015-10-26 ENCOUNTER — Ambulatory Visit: Payer: Commercial Managed Care - PPO | Admitting: Oncology

## 2015-10-26 VITALS — BP 136/90 | HR 80 | Temp 98.2°F | Resp 18

## 2015-10-26 DIAGNOSIS — C9002 Multiple myeloma in relapse: Secondary | ICD-10-CM | POA: Diagnosis not present

## 2015-10-26 DIAGNOSIS — Z5112 Encounter for antineoplastic immunotherapy: Secondary | ICD-10-CM

## 2015-10-26 MED ORDER — PROCHLORPERAZINE MALEATE 10 MG PO TABS
10.0000 mg | ORAL_TABLET | Freq: Once | ORAL | Status: AC
  Administered 2015-10-26: 10 mg via ORAL

## 2015-10-26 MED ORDER — ONDANSETRON HCL 8 MG PO TABS
ORAL_TABLET | ORAL | Status: AC
  Filled 2015-10-26: qty 1

## 2015-10-26 MED ORDER — PROCHLORPERAZINE MALEATE 10 MG PO TABS
ORAL_TABLET | ORAL | Status: AC
  Filled 2015-10-26: qty 1

## 2015-10-26 MED ORDER — BORTEZOMIB CHEMO SQ INJECTION 3.5 MG (2.5MG/ML)
1.3000 mg/m2 | Freq: Once | INTRAMUSCULAR | Status: AC
  Administered 2015-10-26: 2.75 mg via SUBCUTANEOUS
  Filled 2015-10-26: qty 2.75

## 2015-10-26 NOTE — Telephone Encounter (Signed)
Adjusted 2/17 appointments per 2/3 pof. Not able to speak patient by phone due to some type of phone issue. Patient to be given new schedule at 2/10 visit.

## 2015-10-26 NOTE — Patient Instructions (Signed)
[facility] Discharge Instructions for Patients Receiving Chemotherapy  Today you received the following chemotherapy agents Velcade To help prevent nausea and vomiting after your treatment, we encourage you to take your nausea medication as prescribed.  If you develop nausea and vomiting that is not controlled by your nausea medication, call the clinic.   BELOW ARE SYMPTOMS THAT SHOULD BE REPORTED IMMEDIATELY:  *FEVER GREATER THAN 100.5 F  *CHILLS WITH OR WITHOUT FEVER  NAUSEA AND VOMITING THAT IS NOT CONTROLLED WITH YOUR NAUSEA MEDICATION  *UNUSUAL SHORTNESS OF BREATH  *UNUSUAL BRUISING OR BLEEDING  TENDERNESS IN MOUTH AND THROAT WITH OR WITHOUT PRESENCE OF ULCERS  *URINARY PROBLEMS  *BOWEL PROBLEMS  UNUSUAL RASH Items with * indicate a potential emergency and should be followed up as soon as possible.  Feel free to call the clinic you have any questions or concerns. The clinic phone number is [phone].  Please show the CHEMO ALERT CARD at check-in to the Emergency Department and triage nurse.   Bortezomib injection (Velcade) What is this medicine? BORTEZOMIB (bor TEZ oh mib) is a medicine that targets proteins in cancer cells and stops the cancer cells from growing. It is used to treat multiple myeloma and mantle-cell lymphoma. This medicine may be used for other purposes; ask your health care provider or pharmacist if you have questions. What should I tell my health care provider before I take this medicine? They need to know if you have any of these conditions: -diabetes -heart disease -irregular heartbeat -liver disease -on hemodialysis -low blood counts, like low white blood cells, platelets, or hemoglobin -peripheral neuropathy -taking medicine for blood pressure -an unusual or allergic reaction to bortezomib, mannitol, boron, other medicines, foods, dyes, or preservatives -pregnant or trying to get pregnant -breast-feeding How should I  use this medicine? This medicine is for injection into a vein or for injection under the skin. It is given by a health care professional in a hospital or clinic setting. Talk to your pediatrician regarding the use of this medicine in children. Special care may be needed. Overdosage: If you think you have taken too much of this medicine contact a poison control center or emergency room at once. NOTE: This medicine is only for you. Do not share this medicine with others. What if I miss a dose? It is important not to miss your dose. Call your doctor or health care professional if you are unable to keep an appointment. What may interact with this medicine? This medicine may interact with the following medications: -ketoconazole -rifampin -ritonavir -St. [name]'s Wort This list may not describe all possible interactions. Give your health care provider a list of all the medicines, herbs, non-prescription drugs, or dietary supplements you use. Also tell them if you smoke, drink alcohol, or use illegal drugs. Some items may interact with your medicine. What should I watch for while using this medicine? Visit your doctor for checks on your progress. This drug may make you feel generally unwell. This is not uncommon, as chemotherapy can affect healthy cells as well as cancer cells. Report any side effects. Continue your course of treatment even though you feel ill unless your doctor tells you to stop. You may get drowsy or dizzy. Do not drive, use machinery, or do anything that needs mental alertness until you know how this medicine affects you. Do not stand or sit up quickly, especially if you are an older patient. This reduces the risk of dizzy or fainting spells. In some   cases, you may be given additional medicines to help with side effects. Follow all directions for their use. Call your doctor or health care professional for advice if you get a fever, chills or sore throat, or other symptoms of a cold or  flu. Do not treat yourself. This drug decreases your body's ability to fight infections. Try to avoid being around people who are sick. This medicine may increase your risk to bruise or bleed. Call your doctor or health care professional if you notice any unusual bleeding. You may need blood work done while you are taking this medicine. In some patients, this medicine may cause a serious brain infection that may cause death. If you have any problems seeing, thinking, speaking, walking, or standing, tell your doctor right away. If you cannot reach your doctor, urgently seek other source of medical care. Do not become pregnant while taking this medicine. Women should inform their doctor if they wish to become pregnant or think they might be pregnant. There is a potential for serious side effects to an unborn child. Talk to your health care professional or pharmacist for more information. Do not breast-feed an infant while taking this medicine. Check with your doctor or health care professional if you get an attack of severe diarrhea, nausea and vomiting, or if you sweat a lot. The loss of too much body fluid can make it dangerous for you to take this medicine. What side effects may I notice from receiving this medicine? Side effects that you should report to your doctor or health care professional as soon as possible: -allergic reactions like skin rash, itching or hives, swelling of the face, lips, or tongue -breathing problems -changes in hearing -changes in vision -fast, irregular heartbeat -feeling faint or lightheaded, falls -pain, tingling, numbness in the hands or feet -right upper belly pain -seizures -swelling of the ankles, feet, hands -unusual bleeding or bruising -unusually weak or tired -vomiting -yellowing of the eyes or skin Side effects that usually do not require medical attention (report to your doctor or health care professional if they continue or are bothersome): -changes in  emotions or moods -constipation -diarrhea -loss of appetite -headache -irritation at site where injected -nausea This list may not describe all possible side effects. Call your doctor for medical advice about side effects. You may report side effects to FDA at 1-800-FDA-1088. Where should I keep my medicine? This drug is given in a hospital or clinic and will not be stored at home. NOTE: This sheet is a summary. It may not cover all possible information. If you have questions about this medicine, talk to your doctor, pharmacist, or health care provider.    2016, Elsevier/Gold Standard. (2014-11-03 14:47:04)  

## 2015-10-27 ENCOUNTER — Telehealth: Payer: Self-pay | Admitting: Pharmacist

## 2015-10-27 NOTE — Telephone Encounter (Signed)
10/27/15: Attempted to reach patient for follow up on oral medication:Pomalyst/Velcade. No answer. Left VM for patient to call back with any questions or issues.   Thank you,  Montel Clock, PharmD, [facility] [phone]

## 2015-10-29 ENCOUNTER — Other Ambulatory Visit (HOSPITAL_BASED_OUTPATIENT_CLINIC_OR_DEPARTMENT_OTHER): Payer: Commercial Managed Care - PPO

## 2015-10-29 ENCOUNTER — Telehealth: Payer: Self-pay | Admitting: Oncology

## 2015-10-29 ENCOUNTER — Ambulatory Visit (HOSPITAL_BASED_OUTPATIENT_CLINIC_OR_DEPARTMENT_OTHER): Payer: Commercial Managed Care - PPO | Admitting: Oncology

## 2015-10-29 ENCOUNTER — Ambulatory Visit (HOSPITAL_BASED_OUTPATIENT_CLINIC_OR_DEPARTMENT_OTHER): Payer: Commercial Managed Care - PPO

## 2015-10-29 VITALS — BP 139/77 | HR 70 | Temp 97.6°F | Resp 18 | Ht 70.0 in | Wt 187.4 lb

## 2015-10-29 DIAGNOSIS — B359 Dermatophytosis, unspecified: Secondary | ICD-10-CM | POA: Diagnosis not present

## 2015-10-29 DIAGNOSIS — C9002 Multiple myeloma in relapse: Secondary | ICD-10-CM | POA: Diagnosis not present

## 2015-10-29 DIAGNOSIS — C9000 Multiple myeloma not having achieved remission: Secondary | ICD-10-CM

## 2015-10-29 DIAGNOSIS — Z5112 Encounter for antineoplastic immunotherapy: Secondary | ICD-10-CM

## 2015-10-29 LAB — CBC WITH DIFFERENTIAL/PLATELET
BASO%: 0.1 % (ref 0.0–2.0)
BASOS ABS: 0 10*3/uL (ref 0.0–0.1)
EOS%: 0.8 % (ref 0.0–7.0)
Eosinophils Absolute: 0 10*3/uL (ref 0.0–0.5)
HEMATOCRIT: 35.2 % — AB (ref 38.4–49.9)
HGB: 11.8 g/dL — ABNORMAL LOW (ref 13.0–17.1)
LYMPH#: 0.4 10*3/uL — AB (ref 0.9–3.3)
LYMPH%: 6.9 % — AB (ref 14.0–49.0)
MCH: 32.4 pg (ref 27.2–33.4)
MCHC: 33.6 g/dL (ref 32.0–36.0)
MCV: 96.3 fL (ref 79.3–98.0)
MONO#: 0.3 10*3/uL (ref 0.1–0.9)
MONO%: 4.9 % (ref 0.0–14.0)
NEUT#: 4.7 10*3/uL (ref 1.5–6.5)
NEUT%: 87.3 % — AB (ref 39.0–75.0)
PLATELETS: 148 10*3/uL (ref 140–400)
RBC: 3.65 10*6/uL — AB (ref 4.20–5.82)
RDW: 17.3 % — ABNORMAL HIGH (ref 11.0–14.6)
WBC: 5.4 10*3/uL (ref 4.0–10.3)

## 2015-10-29 MED ORDER — PROCHLORPERAZINE MALEATE 10 MG PO TABS
ORAL_TABLET | ORAL | Status: AC
  Filled 2015-10-29: qty 1

## 2015-10-29 MED ORDER — BORTEZOMIB CHEMO SQ INJECTION 3.5 MG (2.5MG/ML)
1.3000 mg/m2 | Freq: Once | INTRAMUSCULAR | Status: AC
  Administered 2015-10-29: 2.75 mg via SUBCUTANEOUS
  Filled 2015-10-29: qty 2.75

## 2015-10-29 MED ORDER — KETOCONAZOLE 2 % EX CREA: 1.0000 "application " | TOPICAL_CREAM | Freq: Every day | CUTANEOUS | Status: DC

## 2015-10-29 MED ORDER — PROCHLORPERAZINE MALEATE 10 MG PO TABS
10.0000 mg | ORAL_TABLET | Freq: Once | ORAL | Status: AC
  Administered 2015-10-29: 10 mg via ORAL

## 2015-10-29 NOTE — Progress Notes (Signed)
ID: Andre [name]   DOB: 27-Aug-1951  MR#: [id number]  ZRA#:[id number]   PCP: [name] SU: OTHER MD: [name], [name], [address] [place] [phone]), Ardine Bjork  CHIEF COMPLAINT: Multiple myeloma  CURRENT TREATMENT: Bortezomib, pomalidomide, dexamethasone   HISTORY OF PRESENT ILLNESS: From the original intake nodes:  The patient was worked up for peptic ulcer disease in August of 2011, with significant bleeding and anemia.  The patient was Helicobacter pylori negative.  He received some epinephrine when he had his EGD and then started on Protonix.  [name] anemia slowly resolved so that by September, his hemoglobin was up to 10.9 and by earlier this month, his hemoglobin was up to 12.5.    As part of his general workup, he was found to have a slightly elevated globulin fraction.  In September, his total protein was 8.3 with an albumin of 3.8.  In January, the total protein was 8.4 and albumin 3.6.  With persistence of this slight abnormality, Dr. Olevia Perches obtained serum immunofixation and SPEP.  The SPTP showed an M-spike of 2.67 grams.  A total IgG was 4,190.  Total IgA low at 28.  Total IgM low at 28 also.  The immunofixation showed a monoclonal IgG lambda paraprotein.  There were also monoclonal free lambda light chains present. With this information, the patient was referred for further evaluation.   A diagnosis of myeloma was confirmed by bone marrow biopsy and subsequebnt treatment is as detailed below  INTERVAL HISTORY: Andre [name] returns today for follow-up of his multiple myeloma accompanied by his wife [name]. Today is day 1 cycle 4 of bortezomib which she receives on days 1, 4, 8 and 11 every 21 days. He is also on pomalidomide which he receives daily and dexamethasone which he takes on the bortezomib days.   REVIEW OF SYSTEMS: Andre [name] had a little nausea after his first bortezomib treatment but no vomiting. He has had some headaches,  which are mild, possibly related to sinus problems, and successfully treated with Aleve. He set facial flushing related to the dexamethasone and also insomnia. He is using Ambien 10 mg on the dexamethasone nights. Other nights he uses Benadryl. He does feel tired and was not able to get back to work on the bortezomib day. He doesn't plan to go back to work today either. He has a little bit of heartburn which she treats with Tums. He doesn't know exactly how to take his Prilosec because it supposedly should be on an empty stomach and it conflicts with other medications. He is still exercising. He has mild back pain but is able to walk and uses stationary bike. He feels forgetful and anxious but not depressed. He is still very functional at work. A detailed review of systems today was otherwise stable  PAST MEDICAL HISTORY: Past Medical History  Diagnosis Date  . Hypertension   . Hyperlipidemia   . Duodenal ulcer   . Allergy   . Thyroid disease   . GERD (gastroesophageal reflux disease)   . Depression   . Anemia   . Hypothyroidism   . Asthma     no treatment x 20 years  . Arthritis   . Double vision     occurs at times   . Multiple myeloma 07/04/2011  Significant for peptic ulcer disease as noted above.  History of hyperlipidemia.  History of anxiety and depression.  History of GERD which is significantly improved with weight loss and history of reactive airway  disease, possibly secondary to the GERD which also improved with weight loss.  There was a history of obesity, now much improved secondary to exercise and diet.   PAST SURGICAL HISTORY: Past Surgical History  Procedure Laterality Date  . Cardiolite study  11/25/2003    NORMAL  . Bone marrow transplant  2011    for MM  . Tonsillectomy      FAMILY HISTORY Family History  Problem Relation Age of Onset  . Throat cancer Mother   . Hypertension Father   . Stroke Father   . Asthma Father   . Diabetes Father   . Pancreatic cancer  Brother   [name] father died at the age of 80 following a stroke.  [name] mother died with cancer of the throat at the age of 26.   She had a history of depression and was a smoker.  Not clear how much alcohol she drank, according to the patient.  The patient had two brothers.  One died from the age of 69 from a "kidney problem."  The second one died at the age of 1 from pancreatic cancer. (This may have been duodenal cancer. 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  His wife of 37+ years was [name], but they are now divorced. In 2016 he remarried, his current wife's name is [name].  The patient has two daughters:  [name], who works in [place], who is expecting her second child April 2016. .  They both live here in [place]. 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  The patient is very close to his daughters.  Weick first daughter, [name], born 03/06/2013, apparently has Weber/Osler/Rendu. The patient is not a church attender.  He does derive a great deal of support from friends at the "[employer]" which he attends very regularly, and also participates in Al-Anon even though actually there is no alcohol or drug problem in the family.  He participates in this group and he gets quite a bit of support from it.     ADVANCED DIRECTIVES: In place  HEALTH MAINTENANCE: (updated 07/16/2013) Social History  Substance Use Topics  . Smoking status: Former Smoker -- 3 years    Quit date: 03/27/1969  . Smokeless tobacco: Never Used  . Alcohol Use: No     Colonoscopy:  PSA:  Sept 2014, normal/Dr. [name]  Lipid panel: Sept 2014/Dr. [name]   Allergies  Allergen Reactions  . Rosuvastatin     Other reaction(s): Liver Disorder  . Crestor [Rosuvastatin Calcium]      ADVERSE EFFECTS ON LIVER  . Lipitor [Atorvastatin Calcium]     Current Outpatient Prescriptions  Medication Sig Dispense Refill  . Acetaminophen (TYLENOL 8 HOUR ARTHRITIS PAIN PO) Take by mouth.    [name] aspirin EC 325 MG tablet Take 1 tablet (325 mg total) by mouth daily. 100 tablet 4  . Bisacodyl (DULCOLAX PO) Take 1 tablet by mouth daily as needed (for constipation).     . cetirizine (ZYRTEC) 10 MG tablet Take 10 mg by mouth daily.    . cholecalciferol (VITAMIN D) 1000 UNITS tablet Take 1 tablet (1,000 Units total) by mouth daily. 100 tablet 3  . dexamethasone (DECADRON) 6 MG tablet Take 3 tablets by mouth with food as directed 24 tablet 4  .  escitalopram (LEXAPRO) 10 MG tablet Take 1 tablet (10 mg total) by mouth daily. 30 tablet 5  . levothyroxine (SYNTHROID, LEVOTHROID) 150 MCG tablet Take 1 tablet by mouth  daily 90 tablet 3  . naproxen (NAPROSYN) 250 MG tablet Take by mouth 2 (two) times daily with a meal.    . omeprazole (PRILOSEC) 40 MG capsule Take 1 capsule (40 mg total) by mouth every morning. 90 capsule 3  . pomalidomide (POMALYST) 4 MG capsule Take 1 capsule by mouth with water daily on days 1-14. Repeat every 21 days. 14 capsule 4  . sildenafil (VIAGRA) 100 MG tablet Take 1 tablet (100 mg total) by mouth as needed for erectile dysfunction. 12 tablet 11  . valACYclovir (VALTREX) 1000 MG tablet Take 1 tablet (1,000 mg total) by mouth daily. 60 tablet 6  . zolpidem (AMBIEN) 5 MG tablet Take 1-2 tablets (5-10 mg total) by mouth at bedtime as needed for sleep. 30 tablet 1  . [DISCONTINUED] fexofenadine (ALLEGRA) 60 MG tablet Take 180 mg by mouth daily.       No current facility-administered medications for this visit.    OBJECTIVE: Middle-aged white man who appears stated age 65 Vitals:   10/29/15 0858  BP: 139/77  Pulse: 70  Temp: 97.6 F (36.4 C)  Resp: 18   Body mass index is 26.89 kg/(m^2).  Filed Weights   10/29/15 0858  Weight: 187 lb 6.4 oz (85.004 kg)     Sclerae unicteric, pupils round and equal Oropharynx clear and moist-- no thrush or other lesions No cervical or supraclavicular adenopathy Lungs no rales or rhonchi Heart regular rate and rhythm Abd soft, nontender, positive bowel sounds MSK no focal spinal tenderness, no upper extremity lymphedema Neuro: nonfocal, well oriented, appropriate affect Skin: An area of dermatophytosis left lower quadrant    LAB RESULTS:  Lab Results  Component Value Date   WBC 5.4 10/29/2015   NEUTROABS 4.7 10/29/2015   HGB 11.8* 10/29/2015   HCT 35.2* 10/29/2015   MCV 96.3 10/29/2015   PLT 148 10/29/2015      Chemistry      Component Value Date/Time   NA 136 10/19/2015 0940   NA 137 05/29/2015 1532   K 4.1 10/19/2015 0940   K 5.0 05/29/2015 1532   CL 104 05/29/2015 1532   CL 107 03/10/2013 1352   CO2 18* 10/19/2015 0940   CO2 22 05/29/2015 1532   BUN 20.8 10/19/2015 0940   BUN 20 05/29/2015 1532   CREATININE 1.2 10/19/2015 0940   CREATININE 1.32* 05/29/2015 1532      Component Value Date/Time   CALCIUM 9.6 10/19/2015 0940   CALCIUM 9.6 05/29/2015 1532   ALKPHOS 78 10/19/2015 0940   ALKPHOS 85 05/29/2015 1532   AST 23 10/19/2015 0940   AST 31 05/29/2015 1532   ALT 23 10/19/2015 0940   ALT 24 05/29/2015 1532   BILITOT 0.35 10/19/2015 0940   BILITOT 0.5 05/29/2015 1532    Results for AMAN, BONET (MRN [id number]) as of 10/22/2015 15:17  Ref. Range 08/04/2014 08:01 09/15/2014 08:11 10/27/2014 08:31 11/24/2014 07:55 10/19/2015 09:40  M-SPIKE, % Latest Ref Range: Not Observed g/dL 0.68 0.67 0.69 0.70 1.3 (H)      Ref Range 10d ago    Ig Kappa Free Light Chain 3.30 - 19.40 mg/L 23.04 (H)   Ig Lambda Free Light Chain 5.71 - 26.30 mg/L 121.32 (H)   Kappa/Lambda FluidC Ratio 0.26 - 1.65  0.19 (L)  STUDIES: No results found.  ASSESSMENT: 65 y.o.  [place] man with a history of multiple myeloma diagnosed February of 2012, with an initial M-spike of 2.6 g/dL, IFE  showing an IgG lambda paraprotein and free lambda light chains in the urine. Cytogenetics showed trisomy 19. Bone marrow biopsy showed a 22% plasmacytosis. Treated with   (1) bortezomib (subcutaneously), lenalidomide, and dexamethasone, with repeat bone marrow biopsy May of 2012 showing 10% plasmacytosis   (2) high-dose chemotherapy with BCNU and melphalan at [facility], followed by stem cell rescue July of 2012   (3) on zoledronic acid started December of 2012, initially monthly, currently given every 3 months, most recent dose 02/17/2014  (4) low-dose lenalidomide resumed April 2013, interrupted several times since.  Resumed again on 02/19/2013, and a dose of 5 mg daily, 21 days on and 7 days off, later further reduced to 7 days on, 7 days off  (5) CNS symptoms and abnormal brain MRI extensively evaluated by neurology with no definitive diagnosis established; improving   (6) rising M spike noted June 2014 but did not meet criteria for carfilzomib study  (7) on lenalidomide 25 mg daily, 14 days on, 7 days off, starting 04/18/2013, interrupted December 2014 because of rash;   (a) resumed January 2015 at 10 mg/ day at 21 days on/ 7 days off  (Jimel) starting 08/18/2014 decreasedto 10 mg/ day 14 days on, 7 days off because of cytopenias  (c) as of February 2016 he has been on 5 mg daily 7 days on 7 days off  (d) lenalidomide discontinued December 2016 with evidence of disease progression  (8) transient global amnesia 05/29/2015, resolved without intervention  (9) starting PVD 10/25/2015 w ASA 325 thromboprophylaxis, valacyclovir prophylaxis  (a) pomalidomide 4 mg/d days 1-14  (Axil) bortezomib sQ days 2,5,9,,12 of each 21 day cycle  (c) dexamethasome 20 mg two days a week   PLAN:  Andre [name] is tolerating his first cycle of PVD without unusual toxicities. I have encouraged him to continue to exercise and work as much as possible. He is very careful regarding infectious exposures.  I have prescribed  ketoconazole cream for his dermatophytosis. We discussed that he should take his Prilosec. I encouraged him to use the Ambien on dexamethasone nights. It is encouraging that so far he has developed no problems with peripheral neuropathy. He is also managing his constipation better.  He will return next week for completion of this cycle. We will do lab work every 3 weeks, the plan being to continue treatment through April. He will be reevaluated at [facility] mid April and at that time I expect he will receive a transplant date.   Chauncey Cruel, MD     10/29/2015

## 2015-10-29 NOTE — Telephone Encounter (Signed)
Gave patient avs report and appointments for February and March. Treatments appointments added up to one cycle more after last office visit. Additional treatments will be added at future office visits.

## 2015-10-29 NOTE — Progress Notes (Signed)
Okay to proceed with treatment with Cmet from 10/19/15 per Dr. [name].

## 2015-10-29 NOTE — Patient Instructions (Signed)
[facility] Discharge Instructions for Patients Receiving Chemotherapy  Today you received the following chemotherapy agents Velcade To help prevent nausea and vomiting after your treatment, we encourage you to take your nausea medication as prescribed.  If you develop nausea and vomiting that is not controlled by your nausea medication, call the clinic.   BELOW ARE SYMPTOMS THAT SHOULD BE REPORTED IMMEDIATELY:  *FEVER GREATER THAN 100.5 F  *CHILLS WITH OR WITHOUT FEVER  NAUSEA AND VOMITING THAT IS NOT CONTROLLED WITH YOUR NAUSEA MEDICATION  *UNUSUAL SHORTNESS OF BREATH  *UNUSUAL BRUISING OR BLEEDING  TENDERNESS IN MOUTH AND THROAT WITH OR WITHOUT PRESENCE OF ULCERS  *URINARY PROBLEMS  *BOWEL PROBLEMS  UNUSUAL RASH Items with * indicate a potential emergency and should be followed up as soon as possible.  Feel free to call the clinic you have any questions or concerns. The clinic phone number is [phone].  Please show the CHEMO ALERT CARD at check-in to the Emergency Department and triage nurse.   Bortezomib injection (Velcade) What is this medicine? BORTEZOMIB (bor TEZ oh mib) is a medicine that targets proteins in cancer cells and stops the cancer cells from growing. It is used to treat multiple myeloma and mantle-cell lymphoma. This medicine may be used for other purposes; ask your health care provider or pharmacist if you have questions. What should I tell my health care provider before I take this medicine? They need to know if you have any of these conditions: -diabetes -heart disease -irregular heartbeat -liver disease -on hemodialysis -low blood counts, like low white blood cells, platelets, or hemoglobin -peripheral neuropathy -taking medicine for blood pressure -an unusual or allergic reaction to bortezomib, mannitol, boron, other medicines, foods, dyes, or preservatives -pregnant or trying to get pregnant -breast-feeding How should I  use this medicine? This medicine is for injection into a vein or for injection under the skin. It is given by a health care professional in a hospital or clinic setting. Talk to your pediatrician regarding the use of this medicine in children. Special care may be needed. Overdosage: If you think you have taken too much of this medicine contact a poison control center or emergency room at once. NOTE: This medicine is only for you. Do not share this medicine with others. What if I miss a dose? It is important not to miss your dose. Call your doctor or health care professional if you are unable to keep an appointment. What may interact with this medicine? This medicine may interact with the following medications: -ketoconazole -rifampin -ritonavir -St. [name]'s Wort This list may not describe all possible interactions. Give your health care provider a list of all the medicines, herbs, non-prescription drugs, or dietary supplements you use. Also tell them if you smoke, drink alcohol, or use illegal drugs. Some items may interact with your medicine. What should I watch for while using this medicine? Visit your doctor for checks on your progress. This drug may make you feel generally unwell. This is not uncommon, as chemotherapy can affect healthy cells as well as cancer cells. Report any side effects. Continue your course of treatment even though you feel ill unless your doctor tells you to stop. You may get drowsy or dizzy. Do not drive, use machinery, or do anything that needs mental alertness until you know how this medicine affects you. Do not stand or sit up quickly, especially if you are an older patient. This reduces the risk of dizzy or fainting spells. In some   cases, you may be given additional medicines to help with side effects. Follow all directions for their use. Call your doctor or health care professional for advice if you get a fever, chills or sore throat, or other symptoms of a cold or  flu. Do not treat yourself. This drug decreases your body's ability to fight infections. Try to avoid being around people who are sick. This medicine may increase your risk to bruise or bleed. Call your doctor or health care professional if you notice any unusual bleeding. You may need blood work done while you are taking this medicine. In some patients, this medicine may cause a serious brain infection that may cause death. If you have any problems seeing, thinking, speaking, walking, or standing, tell your doctor right away. If you cannot reach your doctor, urgently seek other source of medical care. Do not become pregnant while taking this medicine. Women should inform their doctor if they wish to become pregnant or think they might be pregnant. There is a potential for serious side effects to an unborn child. Talk to your health care professional or pharmacist for more information. Do not breast-feed an infant while taking this medicine. Check with your doctor or health care professional if you get an attack of severe diarrhea, nausea and vomiting, or if you sweat a lot. The loss of too much body fluid can make it dangerous for you to take this medicine. What side effects may I notice from receiving this medicine? Side effects that you should report to your doctor or health care professional as soon as possible: -allergic reactions like skin rash, itching or hives, swelling of the face, lips, or tongue -breathing problems -changes in hearing -changes in vision -fast, irregular heartbeat -feeling faint or lightheaded, falls -pain, tingling, numbness in the hands or feet -right upper belly pain -seizures -swelling of the ankles, feet, hands -unusual bleeding or bruising -unusually weak or tired -vomiting -yellowing of the eyes or skin Side effects that usually do not require medical attention (report to your doctor or health care professional if they continue or are bothersome): -changes in  emotions or moods -constipation -diarrhea -loss of appetite -headache -irritation at site where injected -nausea This list may not describe all possible side effects. Call your doctor for medical advice about side effects. You may report side effects to FDA at 1-800-FDA-1088. Where should I keep my medicine? This drug is given in a hospital or clinic and will not be stored at home. NOTE: This sheet is a summary. It may not cover all possible information. If you have questions about this medicine, talk to your doctor, pharmacist, or health care provider.    2016, Elsevier/Gold Standard. (2014-11-03 14:47:04)  

## 2015-11-02 ENCOUNTER — Other Ambulatory Visit (HOSPITAL_BASED_OUTPATIENT_CLINIC_OR_DEPARTMENT_OTHER): Payer: Commercial Managed Care - PPO

## 2015-11-02 ENCOUNTER — Encounter: Payer: Self-pay | Admitting: Pharmacist

## 2015-11-02 ENCOUNTER — Ambulatory Visit (HOSPITAL_BASED_OUTPATIENT_CLINIC_OR_DEPARTMENT_OTHER): Payer: Commercial Managed Care - PPO

## 2015-11-02 VITALS — BP 128/71 | HR 63 | Temp 98.2°F | Resp 20

## 2015-11-02 DIAGNOSIS — Z5112 Encounter for antineoplastic immunotherapy: Secondary | ICD-10-CM | POA: Diagnosis not present

## 2015-11-02 DIAGNOSIS — C9000 Multiple myeloma not having achieved remission: Secondary | ICD-10-CM

## 2015-11-02 DIAGNOSIS — C9002 Multiple myeloma in relapse: Secondary | ICD-10-CM

## 2015-11-02 LAB — CBC WITH DIFFERENTIAL/PLATELET
BASO%: 0.1 % (ref 0.0–2.0)
Basophils Absolute: 0 10*3/uL (ref 0.0–0.1)
EOS ABS: 0 10*3/uL (ref 0.0–0.5)
EOS%: 0.5 % (ref 0.0–7.0)
HEMATOCRIT: 39.3 % (ref 38.4–49.9)
HEMOGLOBIN: 13 g/dL (ref 13.0–17.1)
LYMPH#: 0.4 10*3/uL — AB (ref 0.9–3.3)
LYMPH%: 6.8 % — ABNORMAL LOW (ref 14.0–49.0)
MCH: 32.2 pg (ref 27.2–33.4)
MCHC: 33 g/dL (ref 32.0–36.0)
MCV: 97.3 fL (ref 79.3–98.0)
MONO#: 0.3 10*3/uL (ref 0.1–0.9)
MONO%: 4.2 % (ref 0.0–14.0)
NEUT%: 88.4 % — ABNORMAL HIGH (ref 39.0–75.0)
NEUTROS ABS: 5.7 10*3/uL (ref 1.5–6.5)
PLATELETS: 117 10*3/uL — AB (ref 140–400)
RBC: 4.04 10*6/uL — ABNORMAL LOW (ref 4.20–5.82)
RDW: 17.6 % — AB (ref 11.0–14.6)
WBC: 6.5 10*3/uL (ref 4.0–10.3)

## 2015-11-02 LAB — COMPREHENSIVE METABOLIC PANEL
ALT: 32 U/L (ref 0–55)
ANION GAP: 10 meq/L (ref 3–11)
AST: 25 U/L (ref 5–34)
Albumin: 3.6 g/dL (ref 3.5–5.0)
Alkaline Phosphatase: 81 U/L (ref 40–150)
BILIRUBIN TOTAL: 0.34 mg/dL (ref 0.20–1.20)
BUN: 24.4 mg/dL (ref 7.0–26.0)
CALCIUM: 9.4 mg/dL (ref 8.4–10.4)
CO2: 19 mEq/L — ABNORMAL LOW (ref 22–29)
CREATININE: 1.3 mg/dL (ref 0.7–1.3)
Chloride: 104 mEq/L (ref 98–109)
EGFR: 56 mL/min/{1.73_m2} — ABNORMAL LOW (ref >90)
Glucose: 78 mg/dl (ref 70–140)
Potassium: 4.3 mEq/L (ref 3.5–5.1)
Sodium: 133 mEq/L — ABNORMAL LOW (ref 136–145)
TOTAL PROTEIN: 8 g/dL (ref 6.4–8.3)

## 2015-11-02 MED ORDER — BORTEZOMIB CHEMO SQ INJECTION 3.5 MG (2.5MG/ML)
1.3000 mg/m2 | Freq: Once | INTRAMUSCULAR | Status: AC
  Administered 2015-11-02: 2.75 mg via SUBCUTANEOUS
  Filled 2015-11-02: qty 2.75

## 2015-11-02 MED ORDER — PROCHLORPERAZINE MALEATE 10 MG PO TABS
ORAL_TABLET | ORAL | Status: AC
  Filled 2015-11-02: qty 1

## 2015-11-02 MED ORDER — PROCHLORPERAZINE MALEATE 10 MG PO TABS
10.0000 mg | ORAL_TABLET | Freq: Once | ORAL | Status: AC
Start: 2015-11-02 — End: 2015-11-02
  Administered 2015-11-02: 10 mg via ORAL

## 2015-11-02 NOTE — Progress Notes (Signed)
..  Oral Chemotherapy Follow-Up Form  Original Start date of oral chemotherapy: _2/6/2017___   Called patient today to follow up regarding patient's oral chemotherapy medication: _Pomalyst/Velcade___  Pt is doing well today. No issues to report. He has been on Regimen for 1 week. No side effects or missed doses.   Pt reports _0___ tablets/doses missed in the last week/month.     Will follow up and call patient again in _2 weeks____   Thank you,  Montel Clock, PharmD, [facility]

## 2015-11-02 NOTE — Progress Notes (Signed)
OK to treat with CMET results from 10/19/15 per Dr. [name].

## 2015-11-02 NOTE — Patient Instructions (Signed)
[facility] Discharge Instructions for Patients Receiving Chemotherapy  Today you received the following chemotherapy agents Velcade To help prevent nausea and vomiting after your treatment, we encourage you to take your nausea medication as prescribed.  If you develop nausea and vomiting that is not controlled by your nausea medication, call the clinic.   BELOW ARE SYMPTOMS THAT SHOULD BE REPORTED IMMEDIATELY:  *FEVER GREATER THAN 100.5 F  *CHILLS WITH OR WITHOUT FEVER  NAUSEA AND VOMITING THAT IS NOT CONTROLLED WITH YOUR NAUSEA MEDICATION  *UNUSUAL SHORTNESS OF BREATH  *UNUSUAL BRUISING OR BLEEDING  TENDERNESS IN MOUTH AND THROAT WITH OR WITHOUT PRESENCE OF ULCERS  *URINARY PROBLEMS  *BOWEL PROBLEMS  UNUSUAL RASH Items with * indicate a potential emergency and should be followed up as soon as possible.  Feel free to call the clinic you have any questions or concerns. The clinic phone number is [phone].  Please show the [facility] at check-in to the Emergency Department and triage nurse.   Bortezomib injection (Velcade) What is this medicine? BORTEZOMIB (bor TEZ oh mib) is a medicine that targets proteins in cancer cells and stops the cancer cells from growing. It is used to treat multiple myeloma and mantle-cell lymphoma. This medicine may be used for other purposes; ask your health care provider or pharmacist if you have questions. What should I tell my health care provider before I take this medicine? They need to know if you have any of these conditions: -diabetes -heart disease -irregular heartbeat -liver disease -on hemodialysis -low blood counts, like low white blood cells, platelets, or hemoglobin -peripheral neuropathy -taking medicine for blood pressure -an unusual or allergic reaction to bortezomib, mannitol, boron, other medicines, foods, dyes, or preservatives -pregnant or trying to get pregnant -breast-feeding How should I  use this medicine? This medicine is for injection into a vein or for injection under the skin. It is given by a health care professional in a hospital or clinic setting. Talk to your pediatrician regarding the use of this medicine in children. Special care may be needed. Overdosage: If you think you have taken too much of this medicine contact a poison control center or emergency room at once. NOTE: This medicine is only for you. Do not share this medicine with others. What if I miss a dose? It is important not to miss your dose. Call your doctor or health care professional if you are unable to keep an appointment. What may interact with this medicine? This medicine may interact with the following medications: -ketoconazole -rifampin -ritonavir -St. [name]'s Wort This list may not describe all possible interactions. Give your health care provider a list of all the medicines, herbs, non-prescription drugs, or dietary supplements you use. Also tell them if you smoke, drink alcohol, or use illegal drugs. Some items may interact with your medicine. What should I watch for while using this medicine? Visit your doctor for checks on your progress. This drug may make you feel generally unwell. This is not uncommon, as chemotherapy can affect healthy cells as well as cancer cells. Report any side effects. Continue your course of treatment even though you feel ill unless your doctor tells you to stop. You may get drowsy or dizzy. Do not drive, use machinery, or do anything that needs mental alertness until you know how this medicine affects you. Do not stand or sit up quickly, especially if you are an older patient. This reduces the risk of dizzy or fainting spells. In some  cases, you may be given additional medicines to help with side effects. Follow all directions for their use. Call your doctor or health care professional for advice if you get a fever, chills or sore throat, or other symptoms of a cold or  flu. Do not treat yourself. This drug decreases your body's ability to fight infections. Try to avoid being around people who are sick. This medicine may increase your risk to bruise or bleed. Call your doctor or health care professional if you notice any unusual bleeding. You may need blood work done while you are taking this medicine. In some patients, this medicine may cause a serious brain infection that may cause death. If you have any problems seeing, thinking, speaking, walking, or standing, tell your doctor right away. If you cannot reach your doctor, urgently seek other source of medical care. Do not become pregnant while taking this medicine. Women should inform their doctor if they wish to become pregnant or think they might be pregnant. There is a potential for serious side effects to an unborn child. Talk to your health care professional or pharmacist for more information. Do not breast-feed an infant while taking this medicine. Check with your doctor or health care professional if you get an attack of severe diarrhea, nausea and vomiting, or if you sweat a lot. The loss of too much body fluid can make it dangerous for you to take this medicine. What side effects may I notice from receiving this medicine? Side effects that you should report to your doctor or health care professional as soon as possible: -allergic reactions like skin rash, itching or hives, swelling of the face, lips, or tongue -breathing problems -changes in hearing -changes in vision -fast, irregular heartbeat -feeling faint or lightheaded, falls -pain, tingling, numbness in the hands or feet -right upper belly pain -seizures -swelling of the ankles, feet, hands -unusual bleeding or bruising -unusually weak or tired -vomiting -yellowing of the eyes or skin Side effects that usually do not require medical attention (report to your doctor or health care professional if they continue or are bothersome): -changes in  emotions or moods -constipation -diarrhea -loss of appetite -headache -irritation at site where injected -nausea This list may not describe all possible side effects. Call your doctor for medical advice about side effects. You may report side effects to FDA at 1-800-FDA-1088. Where should I keep my medicine? This drug is given in a hospital or clinic and will not be stored at home. NOTE: This sheet is a summary. It may not cover all possible information. If you have questions about this medicine, talk to your doctor, pharmacist, or health care provider.    2016, Elsevier/Gold Standard. (2014-11-03 14:47:04)

## 2015-11-04 ENCOUNTER — Telehealth: Payer: Self-pay

## 2015-11-04 NOTE — Telephone Encounter (Signed)
Patient called today stating that he has not had a bowel movement in two days.  He has tried using dulcolax and has not been successful.  Writer encouraged him to try miralax and instructed him to use it as directed, also to pick up a senna type product and take it daily because patient states that he has been dealing with constipation since he began his treatment regimen.  Patient stated understanding.  He has an appt in lab, Gentry Fitz, NP and infusion.  He will discuss with Nira Conn if this issue has not been resolved.

## 2015-11-05 ENCOUNTER — Ambulatory Visit (HOSPITAL_BASED_OUTPATIENT_CLINIC_OR_DEPARTMENT_OTHER): Payer: Commercial Managed Care - PPO | Admitting: Nurse Practitioner

## 2015-11-05 ENCOUNTER — Other Ambulatory Visit (HOSPITAL_BASED_OUTPATIENT_CLINIC_OR_DEPARTMENT_OTHER): Payer: Commercial Managed Care - PPO

## 2015-11-05 ENCOUNTER — Encounter: Payer: Self-pay | Admitting: Nurse Practitioner

## 2015-11-05 ENCOUNTER — Other Ambulatory Visit: Payer: Commercial Managed Care - PPO

## 2015-11-05 ENCOUNTER — Other Ambulatory Visit: Payer: Self-pay | Admitting: *Deleted

## 2015-11-05 ENCOUNTER — Ambulatory Visit (HOSPITAL_BASED_OUTPATIENT_CLINIC_OR_DEPARTMENT_OTHER): Payer: Commercial Managed Care - PPO

## 2015-11-05 ENCOUNTER — Ambulatory Visit: Payer: Commercial Managed Care - PPO

## 2015-11-05 VITALS — BP 135/80 | HR 85 | Temp 97.6°F | Resp 19 | Wt 189.2 lb

## 2015-11-05 DIAGNOSIS — Z5111 Encounter for antineoplastic chemotherapy: Secondary | ICD-10-CM | POA: Diagnosis not present

## 2015-11-05 DIAGNOSIS — D696 Thrombocytopenia, unspecified: Secondary | ICD-10-CM | POA: Diagnosis not present

## 2015-11-05 DIAGNOSIS — K5900 Constipation, unspecified: Secondary | ICD-10-CM

## 2015-11-05 DIAGNOSIS — C9002 Multiple myeloma in relapse: Secondary | ICD-10-CM

## 2015-11-05 DIAGNOSIS — C9000 Multiple myeloma not having achieved remission: Secondary | ICD-10-CM

## 2015-11-05 DIAGNOSIS — K279 Peptic ulcer, site unspecified, unspecified as acute or chronic, without hemorrhage or perforation: Secondary | ICD-10-CM

## 2015-11-05 LAB — CBC WITH DIFFERENTIAL/PLATELET
BASO%: 0 % (ref 0.0–2.0)
Basophils Absolute: 0 10*3/uL (ref 0.0–0.1)
EOS%: 0.2 % (ref 0.0–7.0)
Eosinophils Absolute: 0 10*3/uL (ref 0.0–0.5)
HCT: 37.6 % — ABNORMAL LOW (ref 38.4–49.9)
HGB: 12.2 g/dL — ABNORMAL LOW (ref 13.0–17.1)
LYMPH%: 6.8 % — AB (ref 14.0–49.0)
MCH: 31.6 pg (ref 27.2–33.4)
MCHC: 32.6 g/dL (ref 32.0–36.0)
MCV: 97 fL (ref 79.3–98.0)
MONO#: 0.4 10*3/uL (ref 0.1–0.9)
MONO%: 5.8 % (ref 0.0–14.0)
NEUT%: 87.2 % — ABNORMAL HIGH (ref 39.0–75.0)
NEUTROS ABS: 5.3 10*3/uL (ref 1.5–6.5)
Platelets: 81 10*3/uL — ABNORMAL LOW (ref 140–400)
RBC: 3.87 10*6/uL — AB (ref 4.20–5.82)
RDW: 17.3 % — ABNORMAL HIGH (ref 11.0–14.6)
WBC: 6.1 10*3/uL (ref 4.0–10.3)
lymph#: 0.4 10*3/uL — ABNORMAL LOW (ref 0.9–3.3)

## 2015-11-05 MED ORDER — PROCHLORPERAZINE MALEATE 10 MG PO TABS
10.0000 mg | ORAL_TABLET | Freq: Once | ORAL | Status: AC
  Administered 2015-11-05: 10 mg via ORAL

## 2015-11-05 MED ORDER — PROCHLORPERAZINE MALEATE 10 MG PO TABS
ORAL_TABLET | ORAL | Status: AC
  Filled 2015-11-05: qty 1

## 2015-11-05 MED ORDER — BORTEZOMIB CHEMO SQ INJECTION 3.5 MG (2.5MG/ML)
1.3000 mg/m2 | Freq: Once | INTRAMUSCULAR | Status: AC
  Administered 2015-11-05: 2.75 mg via SUBCUTANEOUS
  Filled 2015-11-05: qty 2.75

## 2015-11-05 MED ORDER — OMEPRAZOLE 40 MG PO CPDR: 40.0000 mg | DELAYED_RELEASE_CAPSULE | Freq: Every morning | ORAL | Status: DC

## 2015-11-05 NOTE — Progress Notes (Signed)
ID: PAULMICHAEL [name]   DOB: [birth year]  MR#: [id number]  IDU#:[id number]   PCP: [name] SU: OTHER MD: [name], [name], [address] [place] [phone]), Ardine Bjork  CHIEF COMPLAINT: Multiple myeloma  CURRENT TREATMENT: Bortezomib, pomalidomide, dexamethasone   HISTORY OF PRESENT ILLNESS: From the original intake nodes:  The patient was worked up for peptic ulcer disease in August of 2011, with significant bleeding and anemia.  The patient was Helicobacter pylori negative.  He received some epinephrine when he had his EGD and then started on Protonix.  [name] anemia slowly resolved so that by September, his hemoglobin was up to 10.9 and by earlier this month, his hemoglobin was up to 12.5.    As part of his general workup, he was found to have a slightly elevated globulin fraction.  In September, his total protein was 8.3 with an albumin of 3.8.  In January, the total protein was 8.4 and albumin 3.6.  With persistence of this slight abnormality, Dr. Olevia Perches obtained serum immunofixation and SPEP.  The SPTP showed an M-spike of 2.67 grams.  A total IgG was 4,190.  Total IgA low at 28.  Total IgM low at 28 also.  The immunofixation showed a monoclonal IgG lambda paraprotein.  There were also monoclonal free lambda light chains present. With this information, the patient was referred for further evaluation.   A diagnosis of myeloma was confirmed by bone marrow biopsy and subsequebnt treatment is as detailed below  INTERVAL HISTORY: Andre [name] returns today for follow-up of his multiple myeloma, accompanied by his wife Andre [name]. Today is day 11, cycle 1 of bortezomib which she receives on days 1, 4, 8 and 11 every 21 days. He is also on pomalidomide which he receives daily and dexamethasone which he takes on the bortezomib days.   REVIEW OF SYSTEMS: Andre [name] is using miralax twice daily but is having hard stools. He takes just 1 sennakot tablet daily.  His water intake is fair. He eats a salad daily and his appetite is good. His heartburn has decreased with omeprazole use. He is fatigued. He has regular headaches, managed with aleve. His back pain has resolved. He is using [place] on days he takes high doses of dexamethasone. He denies shortness of breath, chest pain, cough, or palpitations. He denies mouth sores. The rash to his left abdomen is improving but still present. He is not using his ketoconazole cream as often as prescribed. He is anxious but denies depression. A detailed review of systems is otherwise stable.  PAST MEDICAL HISTORY: Past Medical History  Diagnosis Date  . Hypertension   . Hyperlipidemia   . Duodenal ulcer   . Allergy   . Thyroid disease   . GERD (gastroesophageal reflux disease)   . Depression   . Anemia   . Hypothyroidism   . Asthma     no treatment x 20 years  . Arthritis   . Double vision     occurs at times   . Multiple myeloma 07/04/2011  Significant for peptic ulcer disease as noted above.  History of hyperlipidemia.  History of anxiety and depression.  History of GERD which is significantly improved with weight loss and history of reactive airway disease, possibly secondary to the GERD which also improved with weight loss.  There was a history of obesity, now much improved secondary to exercise and diet.   PAST SURGICAL HISTORY: Past Surgical History  Procedure Laterality Date  . Cardiolite study  11/25/2003  NORMAL  . Bone marrow transplant  2011    for MM  . Tonsillectomy      FAMILY HISTORY Family History  Problem Relation Age of Onset  . Throat cancer Mother   . Hypertension Father   . Stroke Father   . Asthma Father   . Diabetes Father   . Pancreatic cancer Brother   [name] father died at the age of 10 following a stroke.  [name] mother died with cancer of the throat at the age of 66.   She had a history of depression and was a smoker.  Not clear how much alcohol she  drank, according to the patient.  The patient had two brothers.  One died from the age of 52 from a "kidney problem."  The second one died at the age of 61 from pancreatic cancer. (This may have been duodenal cancer. 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  His wife of 37+ years was [name], but they are now divorced. In 2016 he remarried, his current wife's name is [name].  The patient has two daughters:  Andre [name], who works in [place], who is expecting her second child April 2016. .  They both live here in [place]. 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  The patient is very close to his daughters.  [place] first daughter, Andre [name], born 03/06/2013, apparently has Weber/Osler/Rendu. The patient is not a church attender.  He does derive a great deal of support from friends at the "[employer]" which he attends very regularly, and also participates in Al-Anon even though actually there is no alcohol or drug problem in the family.  He participates in this group and he gets quite a bit of support from it.     ADVANCED DIRECTIVES: In place  HEALTH MAINTENANCE: (updated 07/16/2013) Social History  Substance Use Topics  . Smoking status: Former Smoker -- 3 years    Quit date: 03/27/1969  . Smokeless tobacco: Never Used  . Alcohol Use: No     Colonoscopy:  PSA:  Sept 2014, normal/Dr. [name]  Lipid panel: Sept 2014/Dr. [name]   Allergies  Allergen Reactions  . Rosuvastatin     Other reaction(s): Liver Disorder  . Crestor [Rosuvastatin Calcium]     ADVERSE EFFECTS ON LIVER  . Lipitor [Atorvastatin Calcium]     Current Outpatient Prescriptions  Medication Sig Dispense Refill  . Acetaminophen (TYLENOL 8 HOUR ARTHRITIS PAIN PO) Take by mouth.    [name] aspirin EC 325 MG  tablet Take 1 tablet (325 mg total) by mouth daily. 100 tablet 4  . cetirizine (ZYRTEC) 10 MG tablet Take 10 mg by mouth daily.    . cholecalciferol (VITAMIN D) 1000 UNITS tablet Take 1 tablet (1,000 Units total) by mouth daily. 100 tablet 3  . dexamethasone (DECADRON) 6 MG tablet Take 3 tablets by mouth with food as directed 24 tablet 4  . escitalopram (LEXAPRO) 10 MG tablet Take 1 tablet (10 mg total) by mouth daily. 30 tablet 5  . ketoconazole (NIZORAL) 2 % cream Apply 1 application topically daily. 15 g 0  . levothyroxine (SYNTHROID, LEVOTHROID) 150 MCG tablet Take 1 tablet by mouth  daily 90 tablet 3  . naproxen (NAPROSYN) 250 MG tablet Take by mouth 2 (two) times daily  with a meal.    . omeprazole (PRILOSEC) 40 MG capsule Take 1 capsule (40 mg total) by mouth every morning. 90 capsule 3  . pomalidomide (POMALYST) 4 MG capsule Take 1 capsule by mouth with water daily on days 1-14. Repeat every 21 days. 14 capsule 4  . valACYclovir (VALTREX) 1000 MG tablet Take 1 tablet (1,000 mg total) by mouth daily. 60 tablet 6  . zolpidem (AMBIEN) 5 MG tablet Take 1-2 tablets (5-10 mg total) by mouth at bedtime as needed for sleep. 30 tablet 1  . Bisacodyl (DULCOLAX PO) Take 1 tablet by mouth daily as needed (for constipation). Reported on 11/05/2015    . sildenafil (VIAGRA) 100 MG tablet Take 1 tablet (100 mg total) by mouth as needed for erectile dysfunction. (Patient not taking: Reported on 11/05/2015) 12 tablet 11  . [DISCONTINUED] fexofenadine (ALLEGRA) 60 MG tablet Take 180 mg by mouth daily.       No current facility-administered medications for this visit.    OBJECTIVE: Middle-aged white man who appears stated age 65 Vitals:   11/05/15 1125  BP: 135/80  Pulse: 85  Temp: 97.6 F (36.4 C)  Resp: 19   Body mass index is 27.15 kg/(m^2).  Filed Weights   11/05/15 1125  Weight: 189 lb 3.2 oz (85.821 kg)   Skin: continued dermatophytosis to left lower quadrant HEENT: sclerae anicteric,  conjunctivae pink, oropharynx clear. No thrush or mucositis.  Lymph Nodes: No cervical or supraclavicular lymphadenopathy  Lungs: clear to auscultation bilaterally, no rales, wheezes, or rhonci  Heart: regular rate and rhythm  Abdomen: round, soft, non tender, positive bowel sounds  Musculoskeletal: No focal spinal tenderness, no peripheral edema  Neuro: non focal, well oriented, positive affect   LAB RESULTS:  Lab Results  Component Value Date   WBC 6.1 11/05/2015   NEUTROABS 5.3 11/05/2015   HGB 12.2* 11/05/2015   HCT 37.6* 11/05/2015   MCV 97.0 11/05/2015   PLT 81* 11/05/2015      Chemistry      Component Value Date/Time   NA 133* 11/02/2015 0920   NA 137 05/29/2015 1532   K 4.3 11/02/2015 0920   K 5.0 05/29/2015 1532   CL 104 05/29/2015 1532   CL 107 03/10/2013 1352   CO2 19* 11/02/2015 0920   CO2 22 05/29/2015 1532   BUN 24.4 11/02/2015 0920   BUN 20 05/29/2015 1532   CREATININE 1.3 11/02/2015 0920   CREATININE 1.32* 05/29/2015 1532      Component Value Date/Time   CALCIUM 9.4 11/02/2015 0920   CALCIUM 9.6 05/29/2015 1532   ALKPHOS 81 11/02/2015 0920   ALKPHOS 85 05/29/2015 1532   AST 25 11/02/2015 0920   AST 31 05/29/2015 1532   ALT 32 11/02/2015 0920   ALT 24 05/29/2015 1532   BILITOT 0.34 11/02/2015 0920   BILITOT 0.5 05/29/2015 1532    Results for LEXX, MONTE (MRN [id number]) as of 10/22/2015 15:17  Ref. Range 08/04/2014 08:01 09/15/2014 08:11 10/27/2014 08:31 11/24/2014 07:55 10/19/2015 09:40  M-SPIKE, % Latest Ref Range: Not Observed g/dL 0.68 0.67 0.69 0.70 1.3 (H)      Ref Range 10d ago    Ig Kappa Free Light Chain 3.30 - 19.40 mg/L 23.04 (H)   Ig Lambda Free Light Chain 5.71 - 26.30 mg/L 121.32 (H)   Kappa/Lambda FluidC Ratio 0.26 - 1.65  0.19 (L)         STUDIES: No results found.  ASSESSMENT: 65 y.o.   [place] man with  a history of multiple myeloma diagnosed February of 2012, with an initial M-spike of 2.6 g/dL, IFE showing an IgG  lambda paraprotein and free lambda light chains in the urine. Cytogenetics showed trisomy 41. Bone marrow biopsy showed a 22% plasmacytosis. Treated with   (1) bortezomib (subcutaneously), lenalidomide, and dexamethasone, with repeat bone marrow biopsy May of 2012 showing 10% plasmacytosis   (2) high-dose chemotherapy with BCNU and melphalan at [facility], followed by stem cell rescue July of 2012   (3) on zoledronic acid started December of 2012, initially monthly, currently given every 3 months, most recent dose 02/17/2014  (4) low-dose lenalidomide resumed April 2013, interrupted several times since.  Resumed again on 02/19/2013, and a dose of 5 mg daily, 21 days on and 7 days off, later further reduced to 7 days on, 7 days off  (5) CNS symptoms and abnormal brain MRI extensively evaluated by neurology with no definitive diagnosis established; improving   (6) rising M spike noted June 2014 but did not meet criteria for carfilzomib study  (7) on lenalidomide 25 mg daily, 14 days on, 7 days off, starting 04/18/2013, interrupted December 2014 because of rash;   (a) resumed January 2015 at 10 mg/ day at 21 days on/ 7 days off  ([place]) starting 08/18/2014 decreasedto 10 mg/ day 14 days on, 7 days off because of cytopenias  (c) as of February 2016 he has been on 5 mg daily 7 days on 7 days off  (d) lenalidomide discontinued December 2016 with evidence of disease progression  (8) transient global amnesia 05/29/2015, resolved without intervention  (9) starting PVD 10/25/2015 w ASA 325 thromboprophylaxis, valacyclovir prophylaxis  (a) pomalidomide 4 mg/d days 1-14  ([place]) bortezomib sQ days 2,5,9,,12 of each 21 day cycle  (c) dexamethasome 20 mg two days a week   PLAN:  [name]'s main concern was constipation. I wrote down a bowel regimen that he is to lean on for the next 3-5 days. He will take 2 sennakot tablets BID with miralax daily. When he achieves soft formed bowel movements at least every other day he  can "pull back" on the intensity. He will increase his water and fiber intake.   The labs were reviewed in detail and his platelets are down to 81. I consulted with Dr. [name], given that Andre [name] is also on 351m aspirin daily. He suggests we proceed with day 11 velcade as planned.Our goal is platelets 50 or higher.   JJenny Reichmannwill return in 10 days prior to the start of cycle 2 of treatment. He understands and agrees with this plan. He has been encouraged to call with any issues that might arise before his next visit here.   HLaurie Panda NP    11/05/2015

## 2015-11-05 NOTE — Progress Notes (Signed)
Pt states he took his decadron at home prior to coming in for appt.

## 2015-11-05 NOTE — Progress Notes (Signed)
Okay to treat per Gentry Fitz, NP with today's labs and okay to use CMet from 11/02/15.

## 2015-11-05 NOTE — Patient Instructions (Signed)
Bortezomib injection What is this medicine? BORTEZOMIB (bor TEZ oh mib) is a medicine that targets proteins in cancer cells and stops the cancer cells from growing. It is used to treat multiple myeloma and mantle-cell lymphoma. This medicine may be used for other purposes; ask your health care provider or pharmacist if you have questions. What should I tell my health care provider before I take this medicine? They need to know if you have any of these conditions: -diabetes -heart disease -irregular heartbeat -liver disease -on hemodialysis -low blood counts, like low white blood cells, platelets, or hemoglobin -peripheral neuropathy -taking medicine for blood pressure -an unusual or allergic reaction to bortezomib, mannitol, boron, other medicines, foods, dyes, or preservatives -pregnant or trying to get pregnant -breast-feeding How should I use this medicine? This medicine is for injection into a vein or for injection under the skin. It is given by a health care professional in a hospital or clinic setting. Talk to your pediatrician regarding the use of this medicine in children. Special care may be needed. Overdosage: If you think you have taken too much of this medicine contact a poison control center or emergency room at once. NOTE: This medicine is only for you. Do not share this medicine with others. What if I miss a dose? It is important not to miss your dose. Call your doctor or health care professional if you are unable to keep an appointment. What may interact with this medicine? This medicine may interact with the following medications: -ketoconazole -rifampin -ritonavir -St. [name]'s Wort This list may not describe all possible interactions. Give your health care provider a list of all the medicines, herbs, non-prescription drugs, or dietary supplements you use. Also tell them if you smoke, drink alcohol, or use illegal drugs. Some items may interact with your medicine. What  should I watch for while using this medicine? Visit your doctor for checks on your progress. This drug may make you feel generally unwell. This is not uncommon, as chemotherapy can affect healthy cells as well as cancer cells. Report any side effects. Continue your course of treatment even though you feel ill unless your doctor tells you to stop. You may get drowsy or dizzy. Do not drive, use machinery, or do anything that needs mental alertness until you know how this medicine affects you. Do not stand or sit up quickly, especially if you are an older patient. This reduces the risk of dizzy or fainting spells. In some cases, you may be given additional medicines to help with side effects. Follow all directions for their use. Call your doctor or health care professional for advice if you get a fever, chills or sore throat, or other symptoms of a cold or flu. Do not treat yourself. This drug decreases your body's ability to fight infections. Try to avoid being around people who are sick. This medicine may increase your risk to bruise or bleed. Call your doctor or health care professional if you notice any unusual bleeding. You may need blood work done while you are taking this medicine. In some patients, this medicine may cause a serious brain infection that may cause death. If you have any problems seeing, thinking, speaking, walking, or standing, tell your doctor right away. If you cannot reach your doctor, urgently seek other source of medical care. Do not become pregnant while taking this medicine. Women should inform their doctor if they wish to become pregnant or think they might be pregnant. There is a potential for serious  side effects to an unborn child. Talk to your health care professional or pharmacist for more information. Do not breast-feed an infant while taking this medicine. Check with your doctor or health care professional if you get an attack of severe diarrhea, nausea and vomiting, or if  you sweat a lot. The loss of too much body fluid can make it dangerous for you to take this medicine. What side effects may I notice from receiving this medicine? Side effects that you should report to your doctor or health care professional as soon as possible: -allergic reactions like skin rash, itching or hives, swelling of the face, lips, or tongue -breathing problems -changes in hearing -changes in vision -fast, irregular heartbeat -feeling faint or lightheaded, falls -pain, tingling, numbness in the hands or feet -right upper belly pain -seizures -swelling of the ankles, feet, hands -unusual bleeding or bruising -unusually weak or tired -vomiting -yellowing of the eyes or skin Side effects that usually do not require medical attention (report to your doctor or health care professional if they continue or are bothersome): -changes in emotions or moods -constipation -diarrhea -loss of appetite -headache -irritation at site where injected -nausea This list may not describe all possible side effects. Call your doctor for medical advice about side effects. You may report side effects to FDA at 1-800-FDA-1088. Where should I keep my medicine? This drug is given in a hospital or clinic and will not be stored at home. NOTE: This sheet is a summary. It may not cover all possible information. If you have questions about this medicine, talk to your doctor, pharmacist, or health care provider.    2016, [employer] Standard. (2014-11-03 14:47:04)

## 2015-11-06 LAB — PROTEIN / CREATININE RATIO, URINE
Creatinine, Urine: 21.1 mg/dL
PROTEIN UR: 4.4 mg/dL
PROTEIN/CREAT RATIO: 209 mg/g{creat} — AB (ref 0–200)

## 2015-11-07 ENCOUNTER — Other Ambulatory Visit: Payer: Self-pay

## 2015-11-07 ENCOUNTER — Encounter (HOSPITAL_COMMUNITY): Payer: Self-pay | Admitting: Radiology

## 2015-11-07 ENCOUNTER — Inpatient Hospital Stay (HOSPITAL_COMMUNITY)
Admission: EM | Admit: 2015-11-07 | Discharge: 2015-11-09 | DRG: 389 | Disposition: A | Discharge status: Home / Self Care | Payer: Commercial Managed Care - PPO | Attending: Internal Medicine | Admitting: Internal Medicine

## 2015-11-07 ENCOUNTER — Emergency Department (HOSPITAL_COMMUNITY): Payer: Commercial Managed Care - PPO

## 2015-11-07 DIAGNOSIS — Z823 Family history of stroke: Secondary | ICD-10-CM | POA: Diagnosis not present

## 2015-11-07 DIAGNOSIS — E039 Hypothyroidism, unspecified: Secondary | ICD-10-CM | POA: Diagnosis present

## 2015-11-07 DIAGNOSIS — Z8249 Family history of ischemic heart disease and other diseases of the circulatory system: Secondary | ICD-10-CM

## 2015-11-07 DIAGNOSIS — Z808 Family history of malignant neoplasm of other organs or systems: Secondary | ICD-10-CM

## 2015-11-07 DIAGNOSIS — Z9481 Bone marrow transplant status: Secondary | ICD-10-CM

## 2015-11-07 DIAGNOSIS — Z791 Long term (current) use of non-steroidal anti-inflammatories (NSAID): Secondary | ICD-10-CM | POA: Diagnosis not present

## 2015-11-07 DIAGNOSIS — Z8711 Personal history of peptic ulcer disease: Secondary | ICD-10-CM | POA: Diagnosis not present

## 2015-11-07 DIAGNOSIS — C9000 Multiple myeloma not having achieved remission: Secondary | ICD-10-CM | POA: Diagnosis present

## 2015-11-07 DIAGNOSIS — K8020 Calculus of gallbladder without cholecystitis without obstruction: Secondary | ICD-10-CM | POA: Diagnosis present

## 2015-11-07 DIAGNOSIS — R109 Unspecified abdominal pain: Secondary | ICD-10-CM | POA: Diagnosis not present

## 2015-11-07 DIAGNOSIS — D6959 Other secondary thrombocytopenia: Secondary | ICD-10-CM | POA: Diagnosis present

## 2015-11-07 DIAGNOSIS — K5669 Other intestinal obstruction: Secondary | ICD-10-CM | POA: Diagnosis not present

## 2015-11-07 DIAGNOSIS — Z7952 Long term (current) use of systemic steroids: Secondary | ICD-10-CM | POA: Diagnosis not present

## 2015-11-07 DIAGNOSIS — K279 Peptic ulcer, site unspecified, unspecified as acute or chronic, without hemorrhage or perforation: Secondary | ICD-10-CM

## 2015-11-07 DIAGNOSIS — I129 Hypertensive chronic kidney disease with stage 1 through stage 4 chronic kidney disease, or unspecified chronic kidney disease: Secondary | ICD-10-CM | POA: Diagnosis present

## 2015-11-07 DIAGNOSIS — Z7982 Long term (current) use of aspirin: Secondary | ICD-10-CM

## 2015-11-07 DIAGNOSIS — D649 Anemia, unspecified: Secondary | ICD-10-CM | POA: Diagnosis present

## 2015-11-07 DIAGNOSIS — K4090 Unilateral inguinal hernia, without obstruction or gangrene, not specified as recurrent: Secondary | ICD-10-CM | POA: Diagnosis present

## 2015-11-07 DIAGNOSIS — E038 Other specified hypothyroidism: Secondary | ICD-10-CM | POA: Diagnosis not present

## 2015-11-07 DIAGNOSIS — Z888 Allergy status to other drugs, medicaments and biological substances status: Secondary | ICD-10-CM | POA: Diagnosis not present

## 2015-11-07 DIAGNOSIS — K5900 Constipation, unspecified: Secondary | ICD-10-CM

## 2015-11-07 DIAGNOSIS — N183 Chronic kidney disease, stage 3 (moderate): Secondary | ICD-10-CM | POA: Diagnosis present

## 2015-11-07 DIAGNOSIS — Q929 Trisomy and partial trisomy of autosomes, unspecified: Secondary | ICD-10-CM

## 2015-11-07 DIAGNOSIS — K5660 Unspecified intestinal obstruction: Secondary | ICD-10-CM | POA: Diagnosis present

## 2015-11-07 DIAGNOSIS — K56609 Unspecified intestinal obstruction, unspecified as to partial versus complete obstruction: Secondary | ICD-10-CM | POA: Diagnosis present

## 2015-11-07 DIAGNOSIS — Z79899 Other long term (current) drug therapy: Secondary | ICD-10-CM | POA: Diagnosis not present

## 2015-11-07 DIAGNOSIS — E785 Hyperlipidemia, unspecified: Secondary | ICD-10-CM | POA: Diagnosis present

## 2015-11-07 DIAGNOSIS — D6481 Anemia due to antineoplastic chemotherapy: Secondary | ICD-10-CM | POA: Diagnosis present

## 2015-11-07 DIAGNOSIS — D631 Anemia in chronic kidney disease: Secondary | ICD-10-CM | POA: Diagnosis present

## 2015-11-07 DIAGNOSIS — Z825 Family history of asthma and other chronic lower respiratory diseases: Secondary | ICD-10-CM

## 2015-11-07 DIAGNOSIS — Z833 Family history of diabetes mellitus: Secondary | ICD-10-CM

## 2015-11-07 DIAGNOSIS — Z800 Family history of malignant neoplasm of digestive organs: Secondary | ICD-10-CM | POA: Diagnosis not present

## 2015-11-07 DIAGNOSIS — D638 Anemia in other chronic diseases classified elsewhere: Secondary | ICD-10-CM | POA: Diagnosis present

## 2015-11-07 DIAGNOSIS — Z87891 Personal history of nicotine dependence: Secondary | ICD-10-CM

## 2015-11-07 DIAGNOSIS — D696 Thrombocytopenia, unspecified: Secondary | ICD-10-CM

## 2015-11-07 DIAGNOSIS — T451X5A Adverse effect of antineoplastic and immunosuppressive drugs, initial encounter: Secondary | ICD-10-CM | POA: Diagnosis present

## 2015-11-07 DIAGNOSIS — K5901 Slow transit constipation: Secondary | ICD-10-CM | POA: Diagnosis not present

## 2015-11-07 DIAGNOSIS — R748 Abnormal levels of other serum enzymes: Secondary | ICD-10-CM

## 2015-11-07 DIAGNOSIS — Z818 Family history of other mental and behavioral disorders: Secondary | ICD-10-CM | POA: Diagnosis not present

## 2015-11-07 DIAGNOSIS — R1013 Epigastric pain: Secondary | ICD-10-CM | POA: Diagnosis not present

## 2015-11-07 LAB — COMPREHENSIVE METABOLIC PANEL
ALK PHOS: 81 U/L (ref 38–126)
ALT: 36 U/L (ref 17–63)
AST: 26 U/L (ref 15–41)
Albumin: 3.7 g/dL (ref 3.5–5.0)
Anion gap: 11 (ref 5–15)
BUN: 22 mg/dL — ABNORMAL HIGH (ref 6–20)
CALCIUM: 9.4 mg/dL (ref 8.9–10.3)
CO2: 23 mmol/L (ref 22–32)
CREATININE: 1.3 mg/dL — AB (ref 0.61–1.24)
Chloride: 101 mmol/L (ref 101–111)
GFR, EST NON AFRICAN AMERICAN: 56 mL/min — AB (ref >60)
Glucose, Bld: 101 mg/dL — ABNORMAL HIGH (ref 65–99)
Potassium: 4.2 mmol/L (ref 3.5–5.1)
SODIUM: 135 mmol/L (ref 135–145)
Total Bilirubin: 0.4 mg/dL (ref 0.3–1.2)
Total Protein: 6.8 g/dL (ref 6.5–8.1)

## 2015-11-07 LAB — CBC
HCT: 37.7 % — ABNORMAL LOW (ref 39.0–52.0)
Hemoglobin: 12.7 g/dL — ABNORMAL LOW (ref 13.0–17.0)
MCH: 32.1 pg (ref 26.0–34.0)
MCHC: 33.7 g/dL (ref 30.0–36.0)
MCV: 95.2 fL (ref 78.0–100.0)
PLATELETS: 56 10*3/uL — AB (ref 150–400)
RBC: 3.96 MIL/uL — AB (ref 4.22–5.81)
RDW: 16.7 % — AB (ref 11.5–15.5)
WBC: 4.6 10*3/uL (ref 4.0–10.5)

## 2015-11-07 LAB — URINALYSIS, ROUTINE W REFLEX MICROSCOPIC
BILIRUBIN URINE: NEGATIVE
Glucose, UA: NEGATIVE mg/dL
KETONES UR: NEGATIVE mg/dL
Leukocytes, UA: NEGATIVE
Nitrite: NEGATIVE
PROTEIN: NEGATIVE mg/dL
Specific Gravity, Urine: 1.009 (ref 1.005–1.030)
pH: 7 (ref 5.0–8.0)

## 2015-11-07 LAB — URINE MICROSCOPIC-ADD ON

## 2015-11-07 LAB — LIPASE, BLOOD: Lipase: 72 U/L — ABNORMAL HIGH (ref 11–51)

## 2015-11-07 MED ORDER — MENTHOL 3 MG MT LOZG
1.0000 | LOZENGE | OROMUCOSAL | Status: DC | PRN
  Administered 2015-11-07: 3 mg via ORAL
  Filled 2015-11-07: qty 9

## 2015-11-07 MED ORDER — VALACYCLOVIR HCL 500 MG PO TABS
1000.0000 mg | ORAL_TABLET | Freq: Every day | ORAL | Status: DC
  Administered 2015-11-09: 1000 mg via ORAL
  Filled 2015-11-07 (×3): qty 2

## 2015-11-07 MED ORDER — ACETAMINOPHEN 325 MG PO TABS: 650.0000 mg | ORAL_TABLET | Freq: Four times a day (QID) | ORAL | Status: DC | PRN

## 2015-11-07 MED ORDER — IOHEXOL 300 MG/ML  SOLN: 80.0000 mL | Freq: Once | INTRAMUSCULAR | Status: DC | PRN

## 2015-11-07 MED ORDER — ONDANSETRON HCL 4 MG PO TABS: 4.0000 mg | ORAL_TABLET | Freq: Four times a day (QID) | ORAL | Status: DC | PRN

## 2015-11-07 MED ORDER — MORPHINE SULFATE (PF) 4 MG/ML IV SOLN
4.0000 mg | Freq: Once | INTRAVENOUS | Status: AC
  Administered 2015-11-07: 4 mg via INTRAVENOUS
  Filled 2015-11-07: qty 1

## 2015-11-07 MED ORDER — LIDOCAINE VISCOUS 2 % MT SOLN
15.0000 mL | Freq: Once | OROMUCOSAL | Status: AC
  Administered 2015-11-07: 15 mL via OROMUCOSAL
  Filled 2015-11-07: qty 15

## 2015-11-07 MED ORDER — DEXAMETHASONE 6 MG PO TABS
18.0000 mg | ORAL_TABLET | Freq: Every day | ORAL | Status: DC
  Filled 2015-11-07: qty 3

## 2015-11-07 MED ORDER — ESCITALOPRAM OXALATE 10 MG PO TABS
10.0000 mg | ORAL_TABLET | Freq: Every day | ORAL | Status: DC
Start: 2015-11-08 — End: 2015-11-09
  Administered 2015-11-09: 10 mg via ORAL
  Filled 2015-11-07 (×2): qty 1

## 2015-11-07 MED ORDER — MORPHINE SULFATE (PF) 2 MG/ML IV SOLN
2.0000 mg | INTRAVENOUS | Status: DC | PRN
  Administered 2015-11-07 – 2015-11-08 (×3): 2 mg via INTRAVENOUS
  Filled 2015-11-07 (×3): qty 1

## 2015-11-07 MED ORDER — ACETAMINOPHEN 650 MG RE SUPP: 650.0000 mg | Freq: Four times a day (QID) | RECTAL | Status: DC | PRN

## 2015-11-07 MED ORDER — SODIUM CHLORIDE 0.9 % IV SOLN
INTRAVENOUS | Status: AC
  Administered 2015-11-07 – 2015-11-08 (×2): via INTRAVENOUS

## 2015-11-07 MED ORDER — ONDANSETRON HCL 4 MG/2ML IJ SOLN: 4.0000 mg | Freq: Four times a day (QID) | INTRAMUSCULAR | Status: DC | PRN

## 2015-11-07 MED ORDER — PANTOPRAZOLE SODIUM 40 MG IV SOLR
40.0000 mg | Freq: Once | INTRAVENOUS | Status: AC
  Administered 2015-11-07: 40 mg via INTRAVENOUS
  Filled 2015-11-07: qty 40

## 2015-11-07 MED ORDER — PANTOPRAZOLE SODIUM 40 MG IV SOLR
40.0000 mg | INTRAVENOUS | Status: DC
  Administered 2015-11-08: 40 mg via INTRAVENOUS
  Filled 2015-11-07 (×2): qty 40

## 2015-11-07 MED ORDER — IOHEXOL 300 MG/ML  SOLN
100.0000 mL | Freq: Once | INTRAMUSCULAR | Status: AC | PRN
  Administered 2015-11-07: 80 mL via INTRAVENOUS

## 2015-11-07 MED ORDER — KETOCONAZOLE 2 % EX CREA
1.0000 "application " | TOPICAL_CREAM | Freq: Every day | CUTANEOUS | Status: DC
  Administered 2015-11-08: 1 via TOPICAL
  Filled 2015-11-07: qty 15

## 2015-11-07 MED ORDER — VITAMIN D3 25 MCG (1000 UNIT) PO TABS
1000.0000 [IU] | ORAL_TABLET | Freq: Every day | ORAL | Status: DC
  Administered 2015-11-09: 1000 [IU] via ORAL
  Filled 2015-11-07 (×2): qty 1

## 2015-11-07 MED ORDER — HYDROCODONE-ACETAMINOPHEN 5-325 MG PO TABS: 1.0000 | ORAL_TABLET | ORAL | Status: DC | PRN

## 2015-11-07 MED ORDER — ONDANSETRON HCL 4 MG/2ML IJ SOLN
4.0000 mg | Freq: Once | INTRAMUSCULAR | Status: AC
  Administered 2015-11-07: 4 mg via INTRAVENOUS
  Filled 2015-11-07: qty 2

## 2015-11-07 MED ORDER — PHENOL 1.4 % MT LIQD
1.0000 | OROMUCOSAL | Status: DC | PRN
  Administered 2015-11-07: 1 via OROMUCOSAL
  Filled 2015-11-07: qty 177

## 2015-11-07 MED ORDER — IOHEXOL 300 MG/ML  SOLN
25.0000 mL | Freq: Once | INTRAMUSCULAR | Status: AC | PRN
  Administered 2015-11-07: 25 mL via ORAL

## 2015-11-07 MED ORDER — SODIUM CHLORIDE 0.9 % IV BOLUS (SEPSIS)
1000.0000 mL | Freq: Once | INTRAVENOUS | Status: AC
  Administered 2015-11-07: 1000 mL via INTRAVENOUS

## 2015-11-07 MED ORDER — LEVOTHYROXINE SODIUM 150 MCG PO TABS
150.0000 ug | ORAL_TABLET | Freq: Every day | ORAL | Status: DC
  Filled 2015-11-07 (×2): qty 1

## 2015-11-07 MED ORDER — ZOLPIDEM TARTRATE 5 MG PO TABS: 5.0000 mg | ORAL_TABLET | Freq: Every evening | ORAL | Status: DC | PRN

## 2015-11-07 NOTE — H&P (Signed)
[facility] History and Physical  [name] OIZ:[id number] DOB: 02-25-1951 DOA: 11/07/2015  Referring physician: ED physician PCP: [name], MD  Specialists:  Dr. [name] (oncology)   Chief Complaint:  Constipation, epigastric discomfort   HPI: Andre [name] is a 65 y.o. male with PMH of hypertension, hyperlipidemia, hypothyroidism, peptic ulcer disease, and multiple myeloma diagnosed in 2011 and currently on chemotherapy and presents to the ED with several days of worsening constipation and concomitant development of epigastric discomfort. Patient is currently under treatment for his multiple myeloma with most recent treatment on 11/05/2015. He had been complaining of constipation at that time and had escalated his bowel regimen which included daily MiraLAX and Colace. Despite this therapy, patient has had only one, very small bowel movement in the past several days. Over this interval is developed worsening epigastric discomfort. He denies any history of abdominal surgery and has never had similar symptoms previously. He does have a history of peptic ulcer disease but describes the current symptoms as different from anything he had experienced in the past. There's been no fever, chills, chest pain, palpitations, flank pain, or dysuria.  In ED, patient was found to be afebrile, saturating well on room air, and with vital signs stable. CMP is notable for a stable CKD stage III. CBC is notable for a stable normocytic anemia with hemoglobin of 12.7, as well as a worsening chronic thrombocytopenia with platelet count now 56,000. CT of the abdomen was obtained and is consistent with a small bowel obstruction with transition point in the right upper quadrant. NG tube was placed for comfort in the emergency department and the patient was given a dose of IV Protonix. Surgical consultation was requested by the EDP and Dr. Zella Richer has kindly agreed to see the patient. Mr. [name]  remained hemodynamically stable in the emergency department and will be admitted to the general ward for ongoing evaluation and management of small bowel obstruction.  Where does patient live?   At home     Can patient participate in ADLs?  Yes        Review of Systems:   General: no fevers, chills, sweats, weight change, or poor appetite. Gen weakness HEENT: no blurry vision, hearing changes or sore throat Pulm: no dyspnea, cough, or wheeze CV: no chest pain or palpitations Abd: no vomiting or diarrhea. Nausea, epigastric discomfort, constipation GU: no dysuria, hematuria, increased urinary frequency, or urgency  Ext: no leg edema Neuro: no focal weakness, numbness, or tingling, no vision change or hearing loss Skin: no rash, no wounds MSK: No muscle spasm, no deformity, no red, hot, or swollen joint Heme: No easy bruising or bleeding Travel history: No recent long distant travel    Allergy:  Allergies  Allergen Reactions  . Atorvastatin Other (See Comments)  . Rosuvastatin     Other reaction(s): Liver Disorder  . Crestor [Rosuvastatin Calcium]     ADVERSE EFFECTS ON LIVER  . Lipitor [Atorvastatin Calcium]     Past Medical History  Diagnosis Date  . Hypertension   . Hyperlipidemia   . Duodenal ulcer   . Allergy   . Thyroid disease   . GERD (gastroesophageal reflux disease)   . Depression   . Anemia   . Hypothyroidism   . Asthma     no treatment x 20 years  . Arthritis   . Double vision     occurs at times   . Multiple myeloma 07/04/2011    Past Surgical History  Procedure  Laterality Date  . Cardiolite study  11/25/2003    NORMAL  . Bone marrow transplant  2011    for MM  . Tonsillectomy      Social History:  reports that he quit smoking about 46 years ago. He has never used smokeless tobacco. He reports that he does not drink alcohol or use illicit drugs.  Family History:  Family History  Problem Relation Age of Onset  . Throat cancer Mother   .  Hypertension Father   . Stroke Father   . Asthma Father   . Diabetes Father   . Pancreatic cancer Brother      Prior to Admission medications   Medication Sig Start Date End Date Taking? Authorizing Provider  aspirin EC 325 MG tablet Take 1 tablet (325 mg total) by mouth daily. 10/22/15  Yes Chauncey Cruel, MD  Bisacodyl (DULCOLAX PO) Take 1 tablet by mouth daily as needed (for constipation). Reported on 11/05/2015   Yes Historical Provider, MD  cetirizine (ZYRTEC) 10 MG tablet Take 10 mg by mouth daily.   Yes Historical Provider, MD  cholecalciferol (VITAMIN D) 1000 UNITS tablet Take 1 tablet (1,000 Units total) by mouth daily. 03/09/15  Yes Aleksei [name] V, MD  dexamethasone (DECADRON) 6 MG tablet Take 3 tablets by mouth with food as directed 10/15/15  Yes Chauncey Cruel, MD  escitalopram (LEXAPRO) 10 MG tablet Take 1 tablet (10 mg total) by mouth daily. 10/07/15  Yes Aleksei [name] V, MD  ketoconazole (NIZORAL) 2 % cream Apply 1 application topically daily. 10/29/15  Yes Chauncey Cruel, MD  levothyroxine (SYNTHROID, LEVOTHROID) 150 MCG tablet Take 1 tablet by mouth  daily 07/08/15  Yes Aleksei [name] V, MD  naproxen sodium (ANAPROX) 220 MG tablet Take 440 mg by mouth at bedtime as needed (pain.).   Yes Historical Provider, MD  omeprazole (PRILOSEC) 40 MG capsule Take 1 capsule (40 mg total) by mouth every morning. 11/05/15  Yes Chauncey Cruel, MD  polyethylene glycol (MIRALAX / GLYCOLAX) packet Take 17 g by mouth daily.   Yes Historical Provider, MD  pomalidomide (POMALYST) 4 MG capsule Take 1 capsule by mouth with water daily on days 1-14. Repeat every 21 days. 10/07/15  Yes Chauncey Cruel, MD  senna (SENOKOT) 8.6 MG tablet Take 1 tablet by mouth 2 (two) times daily.   Yes Historical Provider, MD  valACYclovir (VALTREX) 1000 MG tablet Take 1 tablet (1,000 mg total) by mouth daily. 10/22/15  Yes Chauncey Cruel, MD  zolpidem (AMBIEN) 5 MG tablet Take 1-2 tablets (5-10 mg  total) by mouth at bedtime as needed for sleep. 10/22/15  Yes Chauncey Cruel, MD  sildenafil (VIAGRA) 100 MG tablet Take 1 tablet (100 mg total) by mouth as needed for erectile dysfunction. Patient not taking: Reported on 11/05/2015 07/03/14   Cassandria Anger, MD    Physical Exam: Filed Vitals:   11/07/15 1600 11/07/15 1620 11/07/15 1822 11/07/15 1847  BP: 113/80 113/80 119/78 123/81  Pulse: 58 56 63 71  Temp:    98.5 F (36.9 C)  TempSrc:    Axillary  Resp: 16 12 12 12   SpO2: 100% 97% 98% 98%   General: Not in acute distress HEENT:       Eyes: PERRL, EOMI, no scleral icterus or conjunctival pallor.       ENT: No discharge from the ears or nose, no pharyngeal ulcers, oral mucosa moist and pink. NGT draining bilious fluid.  Neck: No JVD, no bruit, no appreciable mass Heme: No cervical adenopathy, no pallor Cardiac: S1/S2, RRR, No murmurs, No gallops or rubs. Pulm: Good air movement bilaterally. No rales, wheezing, rhonchi or rubs. Abd: Soft, nondistended, mild tenderness across upper quadrants, no rebound pain or gaurding, BS hypoactive, but present. Ext: No LE edema bilaterally. 2+DP/PT pulse bilaterally. Musculoskeletal: No gross deformity, no red, hot, swollen joints   Skin: No rashes or wounds on exposed surfaces  Neuro: Alert, oriented X3, cranial nerves II-XII grossly intact. No focal findings Psych: Patient is not overtly psychotic, appropriate mood and affect.  Labs on Admission:  Basic Metabolic Panel:  Recent Labs Lab 11/02/15 0920 11/07/15 1256  NA 133* 135  K 4.3 4.2  CL  --  101  CO2 19* 23  GLUCOSE 78 101*  BUN 24.4 22*  CREATININE 1.3 1.30*  CALCIUM 9.4 9.4   Liver Function Tests:  Recent Labs Lab 11/02/15 0920 11/07/15 1256  AST 25 26  ALT 32 36  ALKPHOS 81 81  BILITOT 0.34 0.4  PROT 8.0 6.8  ALBUMIN 3.6 3.7    Recent Labs Lab 11/07/15 1256  LIPASE 72*   No results for input(s): AMMONIA in the last 168 hours. CBC:  Recent  Labs Lab 11/02/15 0838 11/05/15 1104 11/07/15 1256  WBC 6.5 6.1 4.6  NEUTROABS 5.7 5.3  --   HGB 13.0 12.2* 12.7*  HCT 39.3 37.6* 37.7*  MCV 97.3 97.0 95.2  PLT 117* 81* 56*   Cardiac Enzymes: No results for input(s): CKTOTAL, CKMB, CKMBINDEX, TROPONINI in the last 168 hours.  BNP (last 3 results) No results for input(s): BNP in the last 8760 hours.  ProBNP (last 3 results) No results for input(s): PROBNP in the last 8760 hours.  CBG: No results for input(s): GLUCAP in the last 168 hours.  Radiological Exams on Admission: Ct Abdomen Pelvis W Contrast  11/07/2015  CLINICAL DATA:  Patient with multiple myeloma diagnosed October, 2012. Last chemotherapy treatment 11/05/2015. Lower abdominal pain and cramping today. Initial encounter. EXAM: CT ABDOMEN AND PELVIS WITH CONTRAST TECHNIQUE: Multidetector CT imaging of the abdomen and pelvis was performed using the standard protocol following bolus administration of intravenous contrast. CONTRAST:  25 mL OMNIPAQUE IOHEXOL 300 MG/ML SOLN, 80 mL OMNIPAQUE IOHEXOL 300 MG/ML SOLN COMPARISON:  PET CT scan 04/01/2013.  Osseous survey 09/06/2015. FINDINGS: The lung bases are clear. No pleural or pericardial effusion. Heart size is normal. Calcific coronary artery disease is seen. Small hiatal hernia is noted. A few small gallstones are identified there is no CT evidence of cholecystitis. The liver, spleen, adrenal glands, kidneys and pancreas appear normal. Small bowel loops are dilated up to approximately 3.3 cm. Fecalized contents of small bowel are seen in the mid and right upper quadrant of the abdomen. Transition point appears to the in the right upper quadrant where an angulated loop of bowel is identified. Distal small bowel loops are completely decompressed. There is a small volume of free pelvic fluid. No pneumatosis, free intraperitoneal air or portal venous gas is identified. The patient has a right inguinal hernia containing a loop of small  bowel but there is no transition point in this location. The colon demonstrates scattered diverticular disease without diverticulitis. There is a large volume of stool in the ascending and transverse colon. The appendix appears normal. No lymphadenopathy is identified. Scattered aorto iliac atherosclerosis without aneurysm is seen. Bones demonstrate a mild, anterior superior endplate compression fracture of T12. Scattered punctate lucent lesions are  identified throughout the spine and likely in the pelvis and hips consistent with the patient's history of multiple myeloma. IMPRESSION: The study is positive for small bowel obstruction with a transition poorly likely in the right upper quadrant of the abdomen where an angulated loop of bowel is identified. Right inguinal hernia contains a loop of small bowel but no associated complication such as obstruction. Prominent stool burden ascending and transverse colon. Diverticulosis without diverticulitis. Aortoiliac atherosclerosis. Gallstones without cholecystitis. Punctate lucent lesions in the bones are consistent with history of multiple myeloma. Mild superior endplate compression fracture of T12 is seen on the comparison osseous survey. Calcific coronary artery disease. Electronically Signed   By: Inge Rise M.D.   On: 11/07/2015 17:14    EKG: Independently reviewed.  Abnormal findings:   Sinus rhythm, LAD, no significant change from priors   Assessment/Plan  1. SBO - CT findings c/w SBO, transition point in RUQ  - No relevant surgical history  - Small BM passed 24 hrs prior to arrival  - Bowel sounds hypoactive but present on exam in ED  - NGT placed for comfort, draining dark green liquid  - IVF hydration, NPO  - Monitor for resolution, advance diet as bowel function returns  - Appreciate surgical consultants   2. Multiple myeloma  - Diagnosed 2011, managed by Dr. [name]  - Under treatment with bortezomib, pamalidomide, and dexamethasone   - Appreciate Dr. [name] input; none of these chemo agents known to be associated with SBO; he plans to round on pt tomorrow   3. Thrombocytopenia  - Likely chemotherapy-induced  - 56,000 on admission, lower than priors  - Hold pharmacologic VTE ppx, SCD only  - Monitor   4. Hypothyroidism - Appears to be stable  - Continue home-dose Synthroid 150 mcg qD    5. Peptic ulcer disease - Reports h/o bleeding peptic ulcer  - Continue IV PPI while NPO  - Monitor   6. Anemia - Stable, likely secondary to chemotherapeutics vs chronic kidney disease  - Hgb 12.7 on arrival, consistent with his apparent baseline  - No sign of active blood loss, monitoring   7. CKD stage III  - SCr 1.30 on admission, consistent with his apparent baseline  - IVF hydration while NPO - Avoid nephrotoxins where possible  - Monitor      DVT ppx:  SCDs  Code Status: Full code Family Communication:  Yes, patient's wife at bed side Disposition Plan: Admit to inpatient   Date of Service 11/07/2015    Vianne Bulls, MD [facility] Pager [phone]  If 7PM-7AM, please contact night-coverage [email or web address] Password [facility] 11/07/2015, 7:03 PM

## 2015-11-07 NOTE — Progress Notes (Signed)
COURTESY NOTE: Called by patient's wife who tells me he is in the ED and has been found to have SBO. Reviewed CT which confirms.  Andre [name]'s myeloma shows minimal activity at present. He is receiving pomalidomide, bortezomib and dexamethasone. None of those agents is associated with SBO. Pomalidomide can cause constipation.The dexamethasone (which he receives only on bortezomib days, last dose 11/05/2015) could potentially hide a diverticular fever. All those agents can be stopped w/o taper while he undergoes evaluation and treatment for his SBO.  Please let me know if I can be of help. Otherwise I will plan to see him in the hospital during rounds tomorrow AM  Lurline Del MD  [phone]

## 2015-11-07 NOTE — ED Provider Notes (Signed)
CSN: [id number]     Arrival date & time 11/07/15  1232 History   First MD Initiated Contact with Patient 11/07/15 1507     Chief Complaint  Patient presents with  . Abdominal Pain  . Constipation     (Consider location/radiation/quality/duration/timing/severity/associated sxs/prior Treatment) HPI Comments: Andre [name] is a 65 y.o. male with a PMHx of HTN, HLD, duodenal ulcer, hypothyroidism, asthma, anemia, depression, arthritis, and multiple myeloma on chemo (last tx Friday 3 days ago), who presents to the ED with complaints of sudden onset upper abdominal pain that began around 6 AM. He describes the pain as 3/10 intermittent cramping in the upper abdomen, nonradiating, with no known aggravating or alleviating factors given the has not tried anything prior to arrival. Associated symptoms include constipation, he has been on laxatives which include Senokot and Mira lax, and the last stool was present 24 hours ago when he had a soft small bowel movement. He also complains of nausea. He admits to chronic NSAID use, took Aleve last night before bed.  He denies any fevers, chills, chest pain, shortness breath, vomiting, diarrhea, obstipation, melena, hematochezia, rectal pain, testicular pain or swelling, dysuria, hematuria, numbness, tingling, weakness, recent travel, suspicious food intake, sick contacts, or alcohol use. He has never had pancreatitis. No prior abd surgeries. No hx of obstructions  Patient is a 65 y.o. male presenting with abdominal pain and constipation. The history is provided by medical records and the patient. No language interpreter was used.  Abdominal Pain Pain location:  LUQ, RUQ and epigastric Pain quality: cramping   Pain radiates to:  Does not radiate Pain severity:  Moderate Onset quality:  Sudden Duration:  9 hours Timing:  Intermittent Progression:  Waxing and waning Chronicity:  New Context: laxative use   Context: not recent travel, not sick contacts and  not suspicious food intake   Relieved by:  None tried Worsened by:  Nothing tried Ineffective treatments:  None tried Associated symptoms: constipation and nausea   Associated symptoms: no chest pain, no chills, no diarrhea, no dysuria, no fever, no flatus, no hematemesis, no hematochezia, no hematuria, no melena, no shortness of breath and no vomiting   Risk factors: NSAID use   Risk factors: no alcohol abuse   Constipation Associated symptoms: abdominal pain and nausea   Associated symptoms: no diarrhea, no dysuria, no fever, no flatus, no hematochezia and no vomiting     Past Medical History  Diagnosis Date  . Hypertension   . Hyperlipidemia   . Duodenal ulcer   . Allergy   . Thyroid disease   . GERD (gastroesophageal reflux disease)   . Depression   . Anemia   . Hypothyroidism   . Asthma     no treatment x 20 years  . Arthritis   . Double vision     occurs at times   . Multiple myeloma 07/04/2011   Past Surgical History  Procedure Laterality Date  . Cardiolite study  11/25/2003    NORMAL  . Bone marrow transplant  2011    for MM  . Tonsillectomy     Family History  Problem Relation Age of Onset  . Throat cancer Mother   . Hypertension Father   . Stroke Father   . Asthma Father   . Diabetes Father   . Pancreatic cancer Brother    Social History  Substance Use Topics  . Smoking status: Former Smoker -- 3 years    Quit date: 03/27/1969  . Smokeless  tobacco: Never Used  . Alcohol Use: No    Review of Systems  Constitutional: Negative for fever and chills.  Respiratory: Negative for shortness of breath.   Cardiovascular: Negative for chest pain.  Gastrointestinal: Positive for nausea, abdominal pain and constipation. Negative for vomiting, diarrhea, blood in stool, melena, hematochezia, anal bleeding, rectal pain, flatus and hematemesis.  Genitourinary: Negative for dysuria, hematuria, scrotal swelling and testicular pain.  Musculoskeletal: Negative for  myalgias and arthralgias.  Skin: Negative for color change.  Allergic/Immunologic: Positive for immunocompromised state (on chemo).  Neurological: Negative for weakness and numbness.  Psychiatric/Behavioral: Negative for confusion.   10 Systems reviewed and are negative for acute change except as noted in the HPI.    Allergies  Atorvastatin; Rosuvastatin; Crestor; and Lipitor  Home Medications   Prior to Admission medications   Medication Sig Start Date End Date Taking? Authorizing Provider  aspirin EC 325 MG tablet Take 1 tablet (325 mg total) by mouth daily. 10/22/15  Yes Chauncey Cruel, MD  Bisacodyl (DULCOLAX PO) Take 1 tablet by mouth daily as needed (for constipation). Reported on 11/05/2015   Yes Historical Provider, MD  cetirizine (ZYRTEC) 10 MG tablet Take 10 mg by mouth daily.   Yes Historical Provider, MD  cholecalciferol (VITAMIN D) 1000 UNITS tablet Take 1 tablet (1,000 Units total) by mouth daily. 03/09/15  Yes Aleksei [name] V, MD  dexamethasone (DECADRON) 6 MG tablet Take 3 tablets by mouth with food as directed 10/15/15  Yes Chauncey Cruel, MD  escitalopram (LEXAPRO) 10 MG tablet Take 1 tablet (10 mg total) by mouth daily. 10/07/15  Yes Aleksei [name] V, MD  ketoconazole (NIZORAL) 2 % cream Apply 1 application topically daily. 10/29/15  Yes Chauncey Cruel, MD  levothyroxine (SYNTHROID, LEVOTHROID) 150 MCG tablet Take 1 tablet by mouth  daily 07/08/15  Yes Aleksei [name] V, MD  naproxen sodium (ANAPROX) 220 MG tablet Take 440 mg by mouth at bedtime as needed (pain.).   Yes Historical Provider, MD  omeprazole (PRILOSEC) 40 MG capsule Take 1 capsule (40 mg total) by mouth every morning. 11/05/15  Yes Chauncey Cruel, MD  polyethylene glycol (MIRALAX / GLYCOLAX) packet Take 17 g by mouth daily.   Yes Historical Provider, MD  pomalidomide (POMALYST) 4 MG capsule Take 1 capsule by mouth with water daily on days 1-14. Repeat every 21 days. 10/07/15  Yes Chauncey Cruel, MD  senna (SENOKOT) 8.6 MG tablet Take 1 tablet by mouth 2 (two) times daily.   Yes Historical Provider, MD  valACYclovir (VALTREX) 1000 MG tablet Take 1 tablet (1,000 mg total) by mouth daily. 10/22/15  Yes Chauncey Cruel, MD  zolpidem (AMBIEN) 5 MG tablet Take 1-2 tablets (5-10 mg total) by mouth at bedtime as needed for sleep. 10/22/15  Yes Chauncey Cruel, MD  sildenafil (VIAGRA) 100 MG tablet Take 1 tablet (100 mg total) by mouth as needed for erectile dysfunction. Patient not taking: Reported on 11/05/2015 07/03/14   Aleksei [name] V, MD   BP 113/81 mmHg  Pulse 58  Temp(Src) 97.8 F (36.6 C) (Oral)  Resp 22  SpO2 99% Physical Exam  Constitutional: He is oriented to person, place, and time. Vital signs are normal. He appears well-developed and well-nourished.  Non-toxic appearance. No distress.  Afebrile, nontoxic, NAD  HENT:  Head: Normocephalic and atraumatic.  Mouth/Throat: Oropharynx is clear and moist. Mucous membranes are dry.  Dry mucous membranes  Eyes: Conjunctivae and EOM are normal. Right eye exhibits  no discharge. Left eye exhibits no discharge.  Neck: Normal range of motion. Neck supple.  Cardiovascular: Normal rate, regular rhythm, normal heart sounds and intact distal pulses.  Exam reveals no gallop and no friction rub.   No murmur heard. Pulmonary/Chest: Effort normal and breath sounds normal. No respiratory distress. He has no decreased breath sounds. He has no wheezes. He has no rhonchi. He has no rales.  Abdominal: Soft. Normal appearance and bowel sounds are normal. He exhibits no distension. There is tenderness in the epigastric area, left upper quadrant and left lower quadrant. There is no rigidity, no rebound, no guarding, no CVA tenderness, no tenderness at McBurney's point and negative Murphy's sign.    Soft, nondistended, +BS throughout, with TTP in LUQ, LLQ, and epigastrum, no r/g/r, neg murphy's, neg mcburney's, no CVA TTP   Musculoskeletal:  Normal range of motion.  Neurological: He is alert and oriented to person, place, and time. He has normal strength. No sensory deficit.  Skin: Skin is warm, dry and intact. No rash noted.  Psychiatric: He has a normal mood and affect.  Nursing note and vitals reviewed.   ED Course  Procedures (including critical care time) Labs Review Labs Reviewed  LIPASE, BLOOD - Abnormal; Notable for the following:    Lipase 72 (*)    All other components within normal limits  COMPREHENSIVE METABOLIC PANEL - Abnormal; Notable for the following:    Glucose, Bld 101 (*)    BUN 22 (*)    Creatinine, Ser 1.30 (*)    GFR calc non Af Amer 56 (*)    All other components within normal limits  CBC - Abnormal; Notable for the following:    RBC 3.96 (*)    Hemoglobin 12.7 (*)    HCT 37.7 (*)    RDW 16.7 (*)    Platelets 56 (*)    All other components within normal limits  URINALYSIS, ROUTINE W REFLEX MICROSCOPIC (NOT AT [facility]) - Abnormal; Notable for the following:    Hgb urine dipstick SMALL (*)    All other components within normal limits  URINE MICROSCOPIC-ADD ON - Abnormal; Notable for the following:    Squamous Epithelial / LPF 0-5 (*)    Bacteria, UA RARE (*)    All other components within normal limits  I-STAT TROPOININ, ED    Imaging Review Ct Abdomen Pelvis W Contrast  11/07/2015  CLINICAL DATA:  Patient with multiple myeloma diagnosed October, 2012. Last chemotherapy treatment 11/05/2015. Lower abdominal pain and cramping today. Initial encounter. EXAM: CT ABDOMEN AND PELVIS WITH CONTRAST TECHNIQUE: Multidetector CT imaging of the abdomen and pelvis was performed using the standard protocol following bolus administration of intravenous contrast. CONTRAST:  25 mL OMNIPAQUE IOHEXOL 300 MG/ML SOLN, 80 mL OMNIPAQUE IOHEXOL 300 MG/ML SOLN COMPARISON:  PET CT scan 04/01/2013.  Osseous survey 09/06/2015. FINDINGS: The lung bases are clear. No pleural or pericardial effusion. Heart size is normal.  Calcific coronary artery disease is seen. Small hiatal hernia is noted. A few small gallstones are identified there is no CT evidence of cholecystitis. The liver, spleen, adrenal glands, kidneys and pancreas appear normal. Small bowel loops are dilated up to approximately 3.3 cm. Fecalized contents of small bowel are seen in the mid and right upper quadrant of the abdomen. Transition point appears to the in the right upper quadrant where an angulated loop of bowel is identified. Distal small bowel loops are completely decompressed. There is a small volume of free pelvic fluid. No  pneumatosis, free intraperitoneal air or portal venous gas is identified. The patient has a right inguinal hernia containing a loop of small bowel but there is no transition point in this location. The colon demonstrates scattered diverticular disease without diverticulitis. There is a large volume of stool in the ascending and transverse colon. The appendix appears normal. No lymphadenopathy is identified. Scattered aorto iliac atherosclerosis without aneurysm is seen. Bones demonstrate a mild, anterior superior endplate compression fracture of T12. Scattered punctate lucent lesions are identified throughout the spine and likely in the pelvis and hips consistent with the patient's history of multiple myeloma. IMPRESSION: The study is positive for small bowel obstruction with a transition poorly likely in the right upper quadrant of the abdomen where an angulated loop of bowel is identified. Right inguinal hernia contains a loop of small bowel but no associated complication such as obstruction. Prominent stool burden ascending and transverse colon. Diverticulosis without diverticulitis. Aortoiliac atherosclerosis. Gallstones without cholecystitis. Punctate lucent lesions in the bones are consistent with history of multiple myeloma. Mild superior endplate compression fracture of T12 is seen on the comparison osseous survey. Calcific coronary  artery disease. Electronically Signed   By: Inge Rise M.D.   On: 11/07/2015 17:14   I have personally reviewed and evaluated these images and lab results as part of my medical decision-making.   EKG Interpretation   Date/Time:  Sunday November 07 2015 15:52:22 EST Ventricular Rate:  60 PR Interval:  173 QRS Duration: 96 QT Interval:  406 QTC Calculation: 406 R Axis:   0 Text Interpretation:  Sinus rhythm Left axis deviation No significant  change since last tracing Confirmed by Wilson Singer  MD, STEPHEN ([id number]) on  11/07/2015 3:56:38 PM      MDM   Final diagnoses:  SBO (small bowel obstruction) (HCC)  Elevated lipase  Constipation, unspecified constipation type  Chronic anemia  Multiple myeloma, remission status unspecified ([place])    65 y.o. male here with upper abdominal pain that began around 6 AM. He is a cancer patient with multiple myeloma. He also complains of constipation with a very small soft bowel movement 24 hours ago but none since then. On exam, tenderness was prominently in the epigastrium and left upper quadrant, no tenderness in the right upper quadrant, negative Murphy's sign, mild tenderness in the left lower quadrant as well. He also complains of nausea. Labs revealed lipase of 72 which would be consistent with early pancreatitis. UA without evidence of UTI. CMP with baseline BUN/creatinine of 22/1.3. No alkaline phosphatase or LFT elevations. Bili is normal. CBC reveals chronic anemia and chronic thrombocytopenia which is relatively unchanged from baseline. Given lab findings of possible pancreatitis, will obtain CT and hopefully this will evaluate the gallbladder as well. Will also see if any obstruction or any other acute findings show in the lower abdominal organs. Doubt need for rectal exam right now but CT will help reveal any impaction. Will give pain meds, nausea meds, and protonix, and fluids, and reassess shortly. Will hold off on GI cocktail until we know if  he can take PO. Will also get EKG and trop.  5:24 PM CT showing SBO with transition point in RUQ, prominent stool burden in transverse and ascending colon, no impaction. Gall stones without cholecystitis, but I question whether this could be contributing to the elevated lipase. Will proceed with NGT placement, and consult surgery as well as admission teams. Pt and family updated. Of note, EKG without acute changes, trop not yet resulted, will  ensure this results. Dr. Wilson Singer saw pt with me and agrees with plan.  5:28 PM Dr. Myna Hidalgo returning page, will admit to med-surg. Still awaiting to hear back from surgery team.   5:43 PM Dr. Zella Richer of surgery returning page, will consult on pt while pt is admitted. Please see H&P notes for further documentation of care. Pt stable at this time.  BP 113/80 mmHg  Pulse 56  Temp(Src) 97.8 F (36.6 C) (Oral)  Resp 12  SpO2 97%  Meds ordered this encounter  Medications  . sodium chloride 0.9 % bolus 1,000 mL    Sig:   . ondansetron (ZOFRAN) injection 4 mg    Sig:   . morphine 4 MG/ML injection 4 mg    Sig:   . pantoprazole (PROTONIX) injection 40 mg    Sig:   . DISCONTD: iohexol (OMNIPAQUE) 300 MG/ML solution 80 mL    Sig:   . iohexol (OMNIPAQUE) 300 MG/ML solution 25 mL    Sig:   . iohexol (OMNIPAQUE) 300 MG/ML solution 100 mL    Sig:   . lidocaine (XYLOCAINE) 2 % viscous mouth solution 15 mL    Sig:       Camprubi-Soms, PA-C 11/07/15 [place], MD 11/11/15 [phone]

## 2015-11-07 NOTE — ED Notes (Addendum)
Chemo pt last treatment on fri. Has had constipation with very little BM 24 hours ago. Has taken multiple different laxatives with no relief. Lower abd pain intermit cramping feeling. Denies any hx of bowel obstruction. Alert x4. States that when he got out of the shower she lowered self to knees due to pain. Did not hit his head.

## 2015-11-07 NOTE — Consult Note (Signed)
Reason for Consult:  SBO Referring Physician: Mercedes Camprubi-Soms, PA-C   Andre [name] is an 64 y.o. male.  HPI: he awoke this morning and is normal time and began having waves of cramping epigastric pain that progressively worsened. He thought it was secondary to some constipation he had. He had a small bowel movement prior to coming to the emergency room but did not get any relief. He has had some nausea but no vomiting. On presentation to the emergency room, he was evaluated and a CT scan demonstrated dilated loops of small bowel with what appeared to be a transition zone in the right upper quadrant area. There is also a small right inguinal hernia containing non-dilated small bowel.   Of note is that he is taking chemotherapy for multiple myeloma. Since being in the hospital, he has passed a small amount of gas. He was able to drink all of the oral contrast for the CT scan.  A nasogastric tube has been placed.  He has had no previous intra-abdominal surgery.  Past Medical History  Diagnosis Date  . Hypertension   . Hyperlipidemia   . Duodenal ulcer   . Allergy   . Thyroid disease   . GERD (gastroesophageal reflux disease)   . Depression   . Anemia   . Hypothyroidism   . Asthma     no treatment x 20 years  . Arthritis   . Double vision     occurs at times   . Multiple myeloma 07/04/2011    Past Surgical History  Procedure Laterality Date  . Cardiolite study  11/25/2003    NORMAL  . Bone marrow transplant  2011    for MM  . Tonsillectomy      Family History  Problem Relation Age of Onset  . Throat cancer Mother   . Hypertension Father   . Stroke Father   . Asthma Father   . Diabetes Father   . Pancreatic cancer Brother     Social History:  reports that he quit smoking about 46 years ago. He has never used smokeless tobacco. He reports that he does not drink alcohol or use illicit drugs.  Allergies:  Allergies  Allergen Reactions  . Atorvastatin Other (See  Comments)  . Rosuvastatin     Other reaction(s): Liver Disorder  . Crestor [Rosuvastatin Calcium]     ADVERSE EFFECTS ON LIVER  . Lipitor [Atorvastatin Calcium]     Prior to Admission medications   Medication Sig Start Date End Date Taking? Authorizing Provider  aspirin EC 325 MG tablet Take 1 tablet (325 mg total) by mouth daily. 10/22/15  Yes Gustav C Magrinat, MD  Bisacodyl (DULCOLAX PO) Take 1 tablet by mouth daily as needed (for constipation). Reported on 11/05/2015   Yes Historical Provider, MD  cetirizine (ZYRTEC) 10 MG tablet Take 10 mg by mouth daily.   Yes Historical Provider, MD  cholecalciferol (VITAMIN D) 1000 UNITS tablet Take 1 tablet (1,000 Units total) by mouth daily. 03/09/15  Yes Aleksei [name] V, MD  dexamethasone (DECADRON) 6 MG tablet Take 3 tablets by mouth with food as directed 10/15/15  Yes Gustav C Magrinat, MD  escitalopram (LEXAPRO) 10 MG tablet Take 1 tablet (10 mg total) by mouth daily. 10/07/15  Yes Aleksei [name] V, MD  ketoconazole (NIZORAL) 2 % cream Apply 1 application topically daily. 10/29/15  Yes Gustav C Magrinat, MD  levothyroxine (SYNTHROID, LEVOTHROID) 150 MCG tablet Take 1 tablet by mouth  daily 07/08/15  Yes Aleksei [name]   V, MD  naproxen sodium (ANAPROX) 220 MG tablet Take 440 mg by mouth at bedtime as needed (pain.).   Yes Historical Provider, MD  omeprazole (PRILOSEC) 40 MG capsule Take 1 capsule (40 mg total) by mouth every morning. 11/05/15  Yes Gustav C Magrinat, MD  polyethylene glycol (MIRALAX / GLYCOLAX) packet Take 17 g by mouth daily.   Yes Historical Provider, MD  pomalidomide (POMALYST) 4 MG capsule Take 1 capsule by mouth with water daily on days 1-14. Repeat every 21 days. 10/07/15  Yes Gustav C Magrinat, MD  senna (SENOKOT) 8.6 MG tablet Take 1 tablet by mouth 2 (two) times daily.   Yes Historical Provider, MD  valACYclovir (VALTREX) 1000 MG tablet Take 1 tablet (1,000 mg total) by mouth daily. 10/22/15  Yes Gustav C Magrinat, MD   zolpidem (AMBIEN) 5 MG tablet Take 1-2 tablets (5-10 mg total) by mouth at bedtime as needed for sleep. 10/22/15  Yes Gustav C Magrinat, MD  sildenafil (VIAGRA) 100 MG tablet Take 1 tablet (100 mg total) by mouth as needed for erectile dysfunction. Patient not taking: Reported on 11/05/2015 07/03/14   Aleksei [name] V, MD     Results for orders placed or performed during the hospital encounter of 11/07/15 (from the past 48 hour(s))  Lipase, blood     Status: Abnormal   Collection Time: 11/07/15 12:56 PM  Result Value Ref Range   Lipase 72 (H) 11 - 51 U/L  Comprehensive metabolic panel     Status: Abnormal   Collection Time: 11/07/15 12:56 PM  Result Value Ref Range   Sodium 135 135 - 145 mmol/L   Potassium 4.2 3.5 - 5.1 mmol/L   Chloride 101 101 - 111 mmol/L   CO2 23 22 - 32 mmol/L   Glucose, Bld 101 (H) 65 - 99 mg/dL   BUN 22 (H) 6 - 20 mg/dL   Creatinine, Ser 1.30 (H) 0.61 - 1.24 mg/dL   Calcium 9.4 8.9 - 10.3 mg/dL   Total Protein 6.8 6.5 - 8.1 g/dL   Albumin 3.7 3.5 - 5.0 g/dL   AST 26 15 - 41 U/L   ALT 36 17 - 63 U/L   Alkaline Phosphatase 81 38 - 126 U/L   Total Bilirubin 0.4 0.3 - 1.2 mg/dL   GFR calc non Af Amer 56 (L) >60 mL/min   GFR calc Af Amer >60 >60 mL/min    Comment: (NOTE) The eGFR has been calculated using the CKD EPI equation. This calculation has not been validated in all clinical situations. eGFR's persistently <60 mL/min signify possible Chronic Kidney Disease.    Anion gap 11 5 - 15  CBC     Status: Abnormal   Collection Time: 11/07/15 12:56 PM  Result Value Ref Range   WBC 4.6 4.0 - 10.5 K/uL   RBC 3.96 (L) 4.22 - 5.81 MIL/uL   Hemoglobin 12.7 (L) 13.0 - 17.0 g/dL   HCT 37.7 (L) 39.0 - 52.0 %   MCV 95.2 78.0 - 100.0 fL   MCH 32.1 26.0 - 34.0 pg   MCHC 33.7 30.0 - 36.0 g/dL   RDW 16.7 (H) 11.5 - 15.5 %   Platelets 56 (L) 150 - 400 K/uL    Comment: REPEATED TO VERIFY SPECIMEN CHECKED FOR CLOTS PLATELET COUNT CONFIRMED BY SMEAR   Urinalysis,  Routine w reflex microscopic (not at [facility])     Status: Abnormal   Collection Time: 11/07/15  2:17 PM  Result Value Ref Range   Color,   Urine YELLOW YELLOW   APPearance CLEAR CLEAR   Specific Gravity, Urine 1.009 1.005 - 1.030   pH 7.0 5.0 - 8.0   Glucose, UA NEGATIVE NEGATIVE mg/dL   Hgb urine dipstick SMALL (A) NEGATIVE   Bilirubin Urine NEGATIVE NEGATIVE   Ketones, ur NEGATIVE NEGATIVE mg/dL   Protein, ur NEGATIVE NEGATIVE mg/dL   Nitrite NEGATIVE NEGATIVE   Leukocytes, UA NEGATIVE NEGATIVE  Urine microscopic-add on     Status: Abnormal   Collection Time: 11/07/15  2:17 PM  Result Value Ref Range   Squamous Epithelial / LPF 0-5 (A) NONE SEEN   WBC, UA 0-5 0 - 5 WBC/hpf   RBC / HPF 6-30 0 - 5 RBC/hpf   Bacteria, UA RARE (A) NONE SEEN    Ct Abdomen Pelvis W Contrast  11/07/2015  CLINICAL DATA:  Patient with multiple myeloma diagnosed October, 2012. Last chemotherapy treatment 11/05/2015. Lower abdominal pain and cramping today. Initial encounter. EXAM: CT ABDOMEN AND PELVIS WITH CONTRAST TECHNIQUE: Multidetector CT imaging of the abdomen and pelvis was performed using the standard protocol following bolus administration of intravenous contrast. CONTRAST:  25 mL OMNIPAQUE IOHEXOL 300 MG/ML SOLN, 80 mL OMNIPAQUE IOHEXOL 300 MG/ML SOLN COMPARISON:  PET CT scan 04/01/2013.  Osseous survey 09/06/2015. FINDINGS: The lung bases are clear. No pleural or pericardial effusion. Heart size is normal. Calcific coronary artery disease is seen. Small hiatal hernia is noted. A few small gallstones are identified there is no CT evidence of cholecystitis. The liver, spleen, adrenal glands, kidneys and pancreas appear normal. Small bowel loops are dilated up to approximately 3.3 cm. Fecalized contents of small bowel are seen in the mid and right upper quadrant of the abdomen. Transition point appears to the in the right upper quadrant where an angulated loop of bowel is identified. Distal small bowel loops are  completely decompressed. There is a small volume of free pelvic fluid. No pneumatosis, free intraperitoneal air or portal venous gas is identified. The patient has a right inguinal hernia containing a loop of small bowel but there is no transition point in this location. The colon demonstrates scattered diverticular disease without diverticulitis. There is a large volume of stool in the ascending and transverse colon. The appendix appears normal. No lymphadenopathy is identified. Scattered aorto iliac atherosclerosis without aneurysm is seen. Bones demonstrate a mild, anterior superior endplate compression fracture of T12. Scattered punctate lucent lesions are identified throughout the spine and likely in the pelvis and hips consistent with the patient's history of multiple myeloma. IMPRESSION: The study is positive for small bowel obstruction with a transition poorly likely in the right upper quadrant of the abdomen where an angulated loop of bowel is identified. Right inguinal hernia contains a loop of small bowel but no associated complication such as obstruction. Prominent stool burden ascending and transverse colon. Diverticulosis without diverticulitis. Aortoiliac atherosclerosis. Gallstones without cholecystitis. Punctate lucent lesions in the bones are consistent with history of multiple myeloma. Mild superior endplate compression fracture of T12 is seen on the comparison osseous survey. Calcific coronary artery disease. Electronically Signed   By: Thomas  Dalessio M.D.   On: 11/07/2015 17:14    Review of Systems  Constitutional: Negative for fever and chills.  Respiratory: Positive for shortness of breath.   Cardiovascular: Negative for chest pain.  Gastrointestinal: Positive for nausea, abdominal pain and constipation. Negative for blood in stool.  Genitourinary: Negative for hematuria.   Blood pressure 123/81, pulse 71, temperature 98.5 F (36.9 C), temperature source Axillary,   resp. rate 12,  SpO2 98 %. Physical Exam  Constitutional: He appears well-developed and well-nourished. No distress.  HENT:  Nasogastric tube in place draining brown-colored fluid  Eyes: No scleral icterus.  Neck: No thyromegaly present.  Cardiovascular: Normal rate and regular rhythm.   Respiratory: Effort normal and breath sounds normal.  GI: Soft. He exhibits no distension and no mass. There is no tenderness.  2 small red rash-like areas where he states he is getting chemotherapy injections.  Genitourinary:  Easily reducible moderate-sized right inguinal hernia.  Neurological: He is alert.  Skin: Skin is warm and dry.    Assessment/Plan: Small bowel obstruction by CT scan. Etiology is unclear at this time. He is not toxic. He is taking chemotherapy for his multiple myeloma.  Recommendation: NG tube decompression. IV fluid hydration. Repeat x-rays in the morning to follow the progression of the oral contrast.  Lluvia Gwynne Andre 11/07/2015, 7:43 PM

## 2015-11-08 ENCOUNTER — Inpatient Hospital Stay (HOSPITAL_COMMUNITY): Payer: Commercial Managed Care - PPO

## 2015-11-08 DIAGNOSIS — E038 Other specified hypothyroidism: Secondary | ICD-10-CM

## 2015-11-08 DIAGNOSIS — R109 Unspecified abdominal pain: Secondary | ICD-10-CM

## 2015-11-08 DIAGNOSIS — C9000 Multiple myeloma not having achieved remission: Secondary | ICD-10-CM

## 2015-11-08 DIAGNOSIS — D649 Anemia, unspecified: Secondary | ICD-10-CM

## 2015-11-08 DIAGNOSIS — K5669 Other intestinal obstruction: Secondary | ICD-10-CM

## 2015-11-08 DIAGNOSIS — K5901 Slow transit constipation: Secondary | ICD-10-CM

## 2015-11-08 LAB — CBC WITH DIFFERENTIAL/PLATELET
Basophils Absolute: 0 10*3/uL (ref 0.0–0.1)
Basophils Relative: 0 %
EOS ABS: 0.1 10*3/uL (ref 0.0–0.7)
Eosinophils Relative: 3 %
HCT: 33.6 % — ABNORMAL LOW (ref 39.0–52.0)
HEMOGLOBIN: 11 g/dL — AB (ref 13.0–17.0)
LYMPHS ABS: 0.6 10*3/uL — AB (ref 0.7–4.0)
Lymphocytes Relative: 15 %
MCH: 32.2 pg (ref 26.0–34.0)
MCHC: 32.7 g/dL (ref 30.0–36.0)
MCV: 98.2 fL (ref 78.0–100.0)
Monocytes Absolute: 0.9 10*3/uL (ref 0.1–1.0)
Monocytes Relative: 23 %
NEUTROS PCT: 60 %
Neutro Abs: 2.5 10*3/uL (ref 1.7–7.7)
PLATELETS: 45 10*3/uL — AB (ref 150–400)
RBC: 3.42 MIL/uL — AB (ref 4.22–5.81)
RDW: 17.2 % — AB (ref 11.5–15.5)
WBC: 4.1 10*3/uL (ref 4.0–10.5)

## 2015-11-08 LAB — BASIC METABOLIC PANEL
ANION GAP: 6 (ref 5–15)
BUN: 17 mg/dL (ref 6–20)
CHLORIDE: 107 mmol/L (ref 101–111)
CO2: 25 mmol/L (ref 22–32)
CREATININE: 1.07 mg/dL (ref 0.61–1.24)
Calcium: 8.2 mg/dL — ABNORMAL LOW (ref 8.9–10.3)
GFR calc non Af Amer: >60 mL/min (ref >60)
Glucose, Bld: 84 mg/dL (ref 65–99)
POTASSIUM: 4.4 mmol/L (ref 3.5–5.1)
SODIUM: 138 mmol/L (ref 135–145)

## 2015-11-08 LAB — GLUCOSE, CAPILLARY
GLUCOSE-CAPILLARY: 66 mg/dL (ref 65–99)
GLUCOSE-CAPILLARY: 102 mg/dL — AB (ref 65–99)
GLUCOSE-CAPILLARY: 81 mg/dL (ref 65–99)

## 2015-11-08 MED ORDER — DEXTROSE 50 % IV SOLN
25.0000 mL | Freq: Once | INTRAVENOUS | Status: AC
  Administered 2015-11-08: 25 mL via INTRAVENOUS
  Filled 2015-11-08: qty 50

## 2015-11-08 MED ORDER — DEXTROSE-NACL 5-0.9 % IV SOLN
INTRAVENOUS | Status: DC
  Administered 2015-11-08: 08:00:00 via INTRAVENOUS

## 2015-11-08 MED ORDER — CHLORHEXIDINE GLUCONATE 0.12 % MT SOLN
15.0000 mL | Freq: Two times a day (BID) | OROMUCOSAL | Status: DC
  Administered 2015-11-08 (×2): 15 mL via OROMUCOSAL
  Filled 2015-11-08 (×3): qty 15

## 2015-11-08 MED ORDER — CETYLPYRIDINIUM CHLORIDE 0.05 % MT LIQD
7.0000 mL | Freq: Two times a day (BID) | OROMUCOSAL | Status: DC
  Administered 2015-11-08: 7 mL via OROMUCOSAL

## 2015-11-08 MED ORDER — LEVOTHYROXINE SODIUM 100 MCG IV SOLR
75.0000 ug | Freq: Every day | INTRAVENOUS | Status: DC
  Administered 2015-11-08: 75 ug via INTRAVENOUS
  Filled 2015-11-08 (×2): qty 5

## 2015-11-08 NOTE — Consult Note (Signed)
[place]  [phone]) [phone] [phone]     ID: Andre [name] DOB: 1951-01-09  MR#: [id number]  HDQ#:[id number]  Patient Care Team: Cassandria Anger, MD as PCP - General (Internal Medicine) Chauncey Cruel, MD as Consulting Physician (Oncology) Concepcion Living, MD as Consulting Physician (Neurology) PCP: [name], MD GYN: SU:  OTHER MD:  CHIEF COMPLAINT: SBO, myeloma  CURRENT TREATMENT: pomalidomide, bortezomib, dexamethasone, ASA   INTERVAL HISTORY: Andre [name] developed cramping abdominal pain yeasterday. He has a long history of problems with constipation and pomalidomide can cause constipation. This was different however, and as the pain grew worse he presented to the ED 11/07/2015 where exam and labs were relatively unremarkable except for an elevated lipase, which led to an abdominal CT. This showed SBO with the apparent tramsition point in the RUQ. There is a R inguinal hernia containing bowel but w/o obstruction. There is evidence of diverticulosis. An ng was placed to suction and he has been admitted with surgical consultation in process.  Andre [name]'s myeloma recently progressed and he was started on remission-induction therapy 10/26/2015. He is receiving pomalidomide, bortezomib and dexamethasone. None of those agents is associated with SBO. Pomalidomide can cause constipation.The dexamethasone (which he receives only on bortezomib days, last dose 11/05/2015) could potentially hide a diverticular fever. All those agents can be stopped w/o taper while he undergoes evaluation and treatment for his SBO.  REVIEW OF SYSTEMS: He c/o discomfort from the ng suction but abdominal pain and cramps are improved. No nausea or vomiting. No h/a, visual changes, confusion, cough, SOB, fever, rash or bleeding. Wife Andre [name] in room.  MULTIPLE MYELOMA HISTORY: From the original intake note::  "Andre [name]" was worked up for peptic ulcer disease in August of  2011, with significant bleeding and anemia. The patient was Helicobacter pylori negative. He received some epinephrine when he had his EGD and then started on Protonix. [name] anemia slowly resolved so that by September, his hemoglobin was up to 10.9 and by earlier this month, his hemoglobin was up to 12.5.   As part of his general workup, he was found to have a slightly elevated globulin fraction. In September 2011, his total protein was 8.3 with an albumin of 3.8. In January, the total protein was 8.4 and albumin 3.6. With persistence of this slight abnormality, Dr. Olevia Perches obtained serum immunofixation and SPEP. The SPTP showed an M-spike of 2.67 grams. A total IgG was 4,190. Total IgA low at 28. Total IgM low at 28 also. The immunofixation showed a monoclonal IgG lambda paraprotein. There were also monoclonal free lambda light chains present. With this information, the patient was referred for further evaluation.   A diagnosis of myeloma was confirmed by bone marrow biopsy and subsequebnt treatment is as detailed below  PAST MEDICAL HISTORY: Past Medical History  Diagnosis Date  . Hypertension   . Hyperlipidemia   . Duodenal ulcer   . Allergy   . Thyroid disease   . GERD (gastroesophageal reflux disease)   . Depression   . Anemia   . Hypothyroidism   . Asthma     no treatment x 20 years  . Arthritis   . Double vision     occurs at times   . Multiple myeloma 07/04/2011    PAST SURGICAL HISTORY: Past Surgical History  Procedure Laterality Date  . Cardiolite study  11/25/2003    NORMAL  . Bone marrow transplant  2011    for MM  . Tonsillectomy  FAMILY HISTORY Family History  Problem Relation Age of Onset  . Throat cancer Mother   . Hypertension Father   . Stroke Father   . Asthma Father   . Diabetes Father   . Pancreatic cancer Brother   [name] father died at the age of 28 following a stroke. [name] mother died with cancer of the throat  at the age of 18. She had a history of depression and was a smoker. Not clear how much alcohol she drank, according to the patient. The patient had two brothers. One died from the age of 58 from a "kidney problem." The second one died at the age of 53 from pancreatic cancer. (This may have been duodenal cancer. 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 His wife of 37+ years was Andre [name], but they are now divorced. In 2016 he remarried, his current wife's name is Andre [name]. The patient has two daughters: Andre [name], who works in [place].. They both live here in [place]. The patient is very close to his daughters. [name] first daughter, Andre [name], born 03/06/2013, apparently has Weber/Osler/Rendu. The patient is not a church attender. He does derive a great deal of support from friends at the "[employer]" which he attends very regularly, and also participates in Al-Anon even though actually there is no alcohol abuse    ADVANCED DIRECTIVES: in place   HEALTH MAINTENANCE: Social History  Substance Use Topics  . Smoking status: Former Smoker -- 3 years    Quit date: 03/27/1969  . Smokeless tobacco: Never Used  . Alcohol Use: No     Allergies  Allergen Reactions  . Atorvastatin Other (See Comments)  . Rosuvastatin     Other reaction(s): Liver Disorder  . Crestor [Rosuvastatin Calcium]     ADVERSE EFFECTS ON LIVER  . Lipitor [Atorvastatin Calcium]     Current Facility-Administered Medications  Medication Dose Route Frequency Provider Last Rate Last Dose  . acetaminophen (TYLENOL) tablet 650 mg  650 mg Oral Q6H PRN Vianne Bulls, MD       Or  . acetaminophen (TYLENOL) suppository 650 mg  650 mg Rectal Q6H PRN Vianne Bulls, MD      . antiseptic oral rinse (CPC / CETYLPYRIDINIUM CHLORIDE 0.05%) solution 7 mL  7 mL Mouth Rinse  [id number] Reyne Dumas, MD      . chlorhexidine (PERIDEX) 0.12 % solution 15 mL  15 mL Mouth Rinse BID Reyne Dumas, MD      . cholecalciferol (VITAMIN D) tablet 1,000 Units  1,000 Units Oral Daily Timothy S Opyd, MD      . dextrose 5 %-0.9 % sodium chloride infusion   Intravenous Continuous Reyne Dumas, MD 75 mL/hr at 11/08/15 0748    . escitalopram (LEXAPRO) tablet 10 mg  10 mg Oral Daily Ilene Qua Opyd, MD      . HYDROcodone-acetaminophen (NORCO/VICODIN) 5-325 MG per tablet 1-2 tablet  1-2 tablet Oral Q4H PRN Ilene Qua Opyd, MD      . ketoconazole (NIZORAL) 2 % cream 1 application  1 application Topical Daily Timothy S Opyd, MD      . levothyroxine (SYNTHROID, LEVOTHROID) tablet 150 mcg  150 mcg Oral QAC breakfast Ilene Qua Opyd, MD      . menthol-cetylpyridinium (CEPACOL) lozenge 3 mg  1 lozenge Oral PRN Jackolyn Confer, MD  3 mg at 11/07/15 2032  . morphine 2 MG/ML injection 2 mg  2 mg Intravenous Q2H PRN Vianne Bulls, MD   2 mg at 11/08/15 0222  . ondansetron (ZOFRAN) tablet 4 mg  4 mg Oral Q6H PRN Vianne Bulls, MD       Or  . ondansetron (ZOFRAN) injection 4 mg  4 mg Intravenous Q6H PRN Vianne Bulls, MD      . pantoprazole (PROTONIX) injection 40 mg  40 mg Intravenous Q24H Ilene Qua Opyd, MD      . phenol (CHLORASEPTIC) mouth spray 1 spray  1 spray Mouth/Throat PRN Jackolyn Confer, MD   1 spray at 11/07/15 2031  . valACYclovir (VALTREX) tablet 1,000 mg  1,000 mg Oral Daily Vianne Bulls, MD   1,000 mg at 11/07/15 2000  . zolpidem (AMBIEN) tablet 5 mg  5 mg Oral QHS PRN Vianne Bulls, MD        OBJECTIVE: middle aged White man examined in bed Filed Vitals:   11/07/15 2110 11/08/15 0525  BP: 147/73 121/64  Pulse: 59 58  Temp: 97.7 F (36.5 C) 97.7 F (36.5 C)  Resp: 14 16     Body mass index is 26.69 kg/(m^2).    ECOG FS:1 - Symptomatic but completely ambulatory  Ocular: Sclerae unicteric, EOMs intact Lymphatic: No cervical or supraclavicular adenopathy Lungs no rales or  rhonchi, auscultated anterolaterally Heart regular rate and rhythm Abd slightly distended, not tender to mild palpation, no BS other than ng noise MSK no focal spinal tenderness, no peripheral edema Neuro: non-focal   LAB RESULTS:  CMP     Component Value Date/Time   NA 138 11/08/2015 0448   NA 133* 11/02/2015 0920   K 4.4 11/08/2015 0448   K 4.3 11/02/2015 0920   CL 107 11/08/2015 0448   CL 107 03/10/2013 1352   CO2 25 11/08/2015 0448   CO2 19* 11/02/2015 0920   GLUCOSE 84 11/08/2015 0448   GLUCOSE 78 11/02/2015 0920   GLUCOSE 99 03/10/2013 1352   BUN 17 11/08/2015 0448   BUN 24.4 11/02/2015 0920   CREATININE 1.07 11/08/2015 0448   CREATININE 1.3 11/02/2015 0920   CALCIUM 8.2* 11/08/2015 0448   CALCIUM 9.4 11/02/2015 0920   PROT 6.8 11/07/2015 1256   PROT 8.0 11/02/2015 0920   PROT 7.0 10/19/2015 0940   ALBUMIN 3.7 11/07/2015 1256   ALBUMIN 3.6 11/02/2015 0920   AST 26 11/07/2015 1256   AST 25 11/02/2015 0920   ALT 36 11/07/2015 1256   ALT 32 11/02/2015 0920   ALKPHOS 81 11/07/2015 1256   ALKPHOS 81 11/02/2015 0920   BILITOT 0.4 11/07/2015 1256   BILITOT 0.34 11/02/2015 0920   GFRNONAA >60 11/08/2015 0448   GFRAA >60 11/08/2015 0448    INo results found for: SPEP, UPEP  Lab Results  Component Value Date   WBC 4.1 11/08/2015   NEUTROABS 2.5 11/08/2015   HGB 11.0* 11/08/2015   HCT 33.6* 11/08/2015   MCV 98.2 11/08/2015   PLT 45* 11/08/2015    _0 @  No results found for: LABCA2  No components found for: LABCA125  No results for input(s): INR in the last 168 hours.  Urinalysis    Component Value Date/Time   COLORURINE YELLOW 11/07/2015 1417   APPEARANCEUR CLEAR 11/07/2015 1417   LABSPEC 1.009 11/07/2015 1417   PHURINE 7.0 11/07/2015 1417   GLUCOSEU NEGATIVE 11/07/2015 1417   GLUCOSEU NEGATIVE 07/08/2015 1050   HGBUR SMALL* 11/07/2015 1417  BILIRUBINUR NEGATIVE 11/07/2015 1417   KETONESUR NEGATIVE 11/07/2015 1417   PROTEINUR  NEGATIVE 11/07/2015 1417   UROBILINOGEN 0.2 07/08/2015 1050   NITRITE NEGATIVE 11/07/2015 1417   LEUKOCYTESUR NEGATIVE 11/07/2015 1417     STUDIES: Ct Abdomen Pelvis W Contrast  11/07/2015  CLINICAL DATA:  Patient with multiple myeloma diagnosed October, 2012. Last chemotherapy treatment 11/05/2015. Lower abdominal pain and cramping today. Initial encounter. EXAM: CT ABDOMEN AND PELVIS WITH CONTRAST TECHNIQUE: Multidetector CT imaging of the abdomen and pelvis was performed using the standard protocol following bolus administration of intravenous contrast. CONTRAST:  25 mL OMNIPAQUE IOHEXOL 300 MG/ML SOLN, 80 mL OMNIPAQUE IOHEXOL 300 MG/ML SOLN COMPARISON:  PET CT scan 04/01/2013.  Osseous survey 09/06/2015. FINDINGS: The lung bases are clear. No pleural or pericardial effusion. Heart size is normal. Calcific coronary artery disease is seen. Small hiatal hernia is noted. A few small gallstones are identified there is no CT evidence of cholecystitis. The liver, spleen, adrenal glands, kidneys and pancreas appear normal. Small bowel loops are dilated up to approximately 3.3 cm. Fecalized contents of small bowel are seen in the mid and right upper quadrant of the abdomen. Transition point appears to the in the right upper quadrant where an angulated loop of bowel is identified. Distal small bowel loops are completely decompressed. There is a small volume of free pelvic fluid. No pneumatosis, free intraperitoneal air or portal venous gas is identified. The patient has a right inguinal hernia containing a loop of small bowel but there is no transition point in this location. The colon demonstrates scattered diverticular disease without diverticulitis. There is a large volume of stool in the ascending and transverse colon. The appendix appears normal. No lymphadenopathy is identified. Scattered aorto iliac atherosclerosis without aneurysm is seen. Bones demonstrate a mild, anterior superior endplate compression  fracture of T12. Scattered punctate lucent lesions are identified throughout the spine and likely in the pelvis and hips consistent with the patient's history of multiple myeloma. IMPRESSION: The study is positive for small bowel obstruction with a transition poorly likely in the right upper quadrant of the abdomen where an angulated loop of bowel is identified. Right inguinal hernia contains a loop of small bowel but no associated complication such as obstruction. Prominent stool burden ascending and transverse colon. Diverticulosis without diverticulitis. Aortoiliac atherosclerosis. Gallstones without cholecystitis. Punctate lucent lesions in the bones are consistent with history of multiple myeloma. Mild superior endplate compression fracture of T12 is seen on the comparison osseous survey. Calcific coronary artery disease. Electronically Signed   By: Inge Rise M.D.   On: 11/07/2015 17:14    ASSESSMENT: 65 y.o. [place] man admitted 11/07/2015 with SBO,  with a history of multiple myeloma diagnosed February of 2012, with an initial M-spike of 2.6 g/dL, IFE showing an IgG lambda paraprotein and free lambda light chains in the urine. Cytogenetics showed trisomy 64. Bone marrow biopsy showed a 22% plasmacytosis. Treated with   (1) bortezomib (subcutaneously), lenalidomide, and dexamethasone, with repeat bone marrow biopsy May of 2012 showing 10% plasmacytosis   (2) high-dose chemotherapy with BCNU and melphalan at [facility], followed by stem cell rescue July of 2012   (3) on zoledronic acid started December of 2012, initially monthly, currently given every 3 months, most recent dose 02/17/2014  (4) low-dose lenalidomide resumed April 2013, interrupted several times since. Resumed again on 02/19/2013, and a dose of 5 mg daily, 21 days on and 7 days off, later further reduced to 7 days on, 7 days  off  (5) CNS symptoms and abnormal brain MRI extensively evaluated by neurology with no definitive  diagnosis established; improving   (6) rising M spike noted June 2014 but did not meet criteria for carfilzomib study  (7) on lenalidomide 25 mg daily, 14 days on, 7 days off, starting 04/18/2013, interrupted December 2014 because of rash;  (a) resumed January 2015 at 10 mg/ day at 21 days on/ 7 days off (Jie) starting 08/18/2014 decreasedto 10 mg/ day 14 days on, 7 days off because of cytopenias (c) as of February 2016 he has been on 5 mg daily 7 days on 7 days off (d) lenalidomide discontinued December 2016 with evidence of disease progression  (8) transient global amnesia 05/29/2015, resolved without intervention  (9) started PVD 10/25/2015 w ASA 325 thromboprophylaxis, valacyclovir prophylaxis (a) pomalidomide 4 mg/d days 1-14 (Javari) bortezomib sQ days 2,5,9,,12 of each 21 day cycle (c) dexamethasome 20 mg two days a week (given on tuesdays and fridays)  (10) autologous stem cell transplant anticipated April or May 2017   PLAN: From a myeloma point of view Andre [name] has early recurrence with no symptomatology. He is currently day 14 cycle 1 of remission induction, the plan being to treat through March after which he will present at [facility] for autologous transplant. His prognosis is generally good and we anticipate years of good-quality life ahead. Accordingly he is a full code.  I do not believe the myeloma is responsible for the SBO. On the other hand he has no history of abdominal procedures. Note he was on ASA prophylactically as his myeloma treament is associated with DVTs--last dose 11/07/2015. If platelets rise above 50K would start prophylactic lovenox. Will write for PAS today.  Next treatment is not due until 11/16/2015. Can postpone as needed until current problem resolved.  Will follow with you. Please let me know if I can be of further help.  Chauncey Cruel, MD   11/08/2015 7:58 AM Medical  Oncology and Hematology [facility] [address] [place], [place] [address] Tel. [phone]    Fax. [phone]

## 2015-11-08 NOTE — Progress Notes (Addendum)
[facility] PROGRESS NOTE  Andre [name] QBH:[id number] DOB: 09/23/1950 DOA: 11/07/2015 PCP: Andre Kehr, MD  Length of stay: 1   Assessment/Plan: Principal Problem:   SBO (small bowel obstruction) (HCC) Active Problems:   ANEMIA   Peptic ulcer disease   Multiple myeloma ([place])   Hypothyroidism   Thrombocytopenia ([place])   Constipation   Chronic anemia     brief summary 65 y.o. male with PMH of hypertension, hyperlipidemia, hypothyroidism, peptic ulcer disease, and multiple myeloma diagnosed in 2011 and currently on chemotherapy and presents to the ED with several days of worsening constipation and concomitant development of epigastric discomfort. Patient is currently under treatment for his multiple myeloma with most recent treatment on 11/05/2015. He had been complaining of constipation at that time and had escalated his bowel regimen which included daily MiraLAX and Colace. Despite this therapy, patient has had only one, very small bowel movement in the past several days.CT of the abdomen was obtained and is consistent with a small bowel obstruction with transition point in the right upper quadrant. NG tube was placed for comfort in the emergency department and the patient was given a dose of IV Protonix. Surgical consultation was requested by the EDP and Dr. Zella Richer  Evaluated the patient.   assessment and plan   1. SBO - CT findings c/w SBO, transition point in RUQ  - No relevant surgical history  - Small BM passed 24 hrs prior to arrival   continue NGT   , KUB this morning normal. Oral contrast in the colon - IVF hydration, NPO  ? Or advance , defer to general surgery    2. Multiple myeloma  - Diagnosed 2011, managed by Dr. Jana [name]  - Under treatment with bortezomib, pamalidomide, and dexamethasone  - Appreciate Dr. Virgie [name] input; none of these chemo agents known to be associated with SBO; he plans to round on pt tomorrow   Next treatment is on  2/28  3. Thrombocytopenia  - Likely chemotherapy-induced   56> 45 , hematology oncology following - Hold pharmacologic VTE ppx, SCD only     4. Hypothyroidism - Appears to be stable  - Continue home-dose Synthroid 150 mcg qD   5. Peptic ulcer disease - Reports h/o bleeding peptic ulcer  - Continue IV PPI while NPO  - Monitor   6. Anemia - Stable, likely secondary to chemotherapeutics vs chronic kidney disease  - Hgb 12.7 on arrival, consistent with his apparent baseline  - No sign of active blood loss, monitoring   7. CKD stage III  - SCr 1.30 on admission,  Now 1.0 -  Continue IVF hydration while NPO - Avoid nephrotoxins where possible       DVT prophylaxsis  SCDs  Code Status:      Code Status Orders        Start     Ordered   11/07/15 1901  Full code   Continuous     11/07/15 1903    Code Status History    Date Active Date Inactive Code Status Order ID Comments User Context   This patient has a current code status but no historical code status.      Family Communication: Discussed in detail with the patient, all imaging results, lab results explained to the patient   Disposition Plan:  Anticipate discharge in 3-5 days     Consultants:   general surgery  Hematology oncology    Procedures:   none  Antibiotics:  Anti-infectives    Start     Dose/Rate Route Frequency Ordered Stop   11/07/15 2000  valACYclovir (VALTREX) tablet 1,000 mg     1,000 mg Oral Daily 11/07/15 1903           HPI/Subjective:  patient had 1350 mL output from NG tube overnight , less nauseous this morning  Objective: Filed Vitals:   11/07/15 1847 11/07/15 1950 11/07/15 2110 11/08/15 0525  BP: 123/81  147/73 121/64  Pulse: 71  59 58  Temp: 98.5 F (36.9 C)  97.7 F (36.5 C) 97.7 F (36.5 C)  TempSrc: Axillary  Oral Oral  Resp: _0 Height:  _1  (1.778 m)    Weight:  84.369 kg (186 lb)    SpO2: 98%  98% 95%    Intake/Output  Summary (Last 24 hours) at 11/08/15 1055 Last data filed at 11/08/15 0600  Gross per 24 hour  Intake 1256.25 ml  Output   1660 ml  Net -403.75 ml    Exam:  General: No acute respiratory distress Lungs: Clear to auscultation bilaterally without wheezes or crackles Cardiovascular: Regular rate and rhythm without murmur gallop or rub normal S1 and S2 Abd: Soft, nondistended, mild tenderness across upper quadrants, no rebound pain or gaurding Extremities: No significant cyanosis, clubbing, or edema bilateral lower extremities     Data Review   Micro Results No results found for this or any previous visit (from the past 240 hour(s)).  Radiology Reports Ct Abdomen Pelvis W Contrast  11/07/2015  CLINICAL DATA:  Patient with multiple myeloma diagnosed October, 2012. Last chemotherapy treatment 11/05/2015. Lower abdominal pain and cramping today. Initial encounter. EXAM: CT ABDOMEN AND PELVIS WITH CONTRAST TECHNIQUE: Multidetector CT imaging of the abdomen and pelvis was performed using the standard protocol following bolus administration of intravenous contrast. CONTRAST:  25 mL OMNIPAQUE IOHEXOL 300 MG/ML SOLN, 80 mL OMNIPAQUE IOHEXOL 300 MG/ML SOLN COMPARISON:  PET CT scan 04/01/2013.  Osseous survey 09/06/2015. FINDINGS: The lung bases are clear. No pleural or pericardial effusion. Heart size is normal. Calcific coronary artery disease is seen. Small hiatal hernia is noted. A few small gallstones are identified there is no CT evidence of cholecystitis. The liver, spleen, adrenal glands, kidneys and pancreas appear normal. Small bowel loops are dilated up to approximately 3.3 cm. Fecalized contents of small bowel are seen in the mid and right upper quadrant of the abdomen. Transition point appears to the in the right upper quadrant where an angulated loop of bowel is identified. Distal small bowel loops are completely decompressed. There is a small volume of free pelvic fluid. No pneumatosis,  free intraperitoneal air or portal venous gas is identified. The patient has a right inguinal hernia containing a loop of small bowel but there is no transition point in this location. The colon demonstrates scattered diverticular disease without diverticulitis. There is a large volume of stool in the ascending and transverse colon. The appendix appears normal. No lymphadenopathy is identified. Scattered aorto iliac atherosclerosis without aneurysm is seen. Bones demonstrate a mild, anterior superior endplate compression fracture of T12. Scattered punctate lucent lesions are identified throughout the spine and likely in the pelvis and hips consistent with the patient's history of multiple myeloma. IMPRESSION: The study is positive for small bowel obstruction with a transition poorly likely in the right upper quadrant of the abdomen where an angulated loop of bowel is identified. Right inguinal hernia contains a loop of small bowel but no associated complication  such as obstruction. Prominent stool burden ascending and transverse colon. Diverticulosis without diverticulitis. Aortoiliac atherosclerosis. Gallstones without cholecystitis. Punctate lucent lesions in the bones are consistent with history of multiple myeloma. Mild superior endplate compression fracture of T12 is seen on the comparison osseous survey. Calcific coronary artery disease. Electronically Signed   By: Inge Rise M.D.   On: 11/07/2015 17:14   Dg Abd 2 Views  11/08/2015  CLINICAL DATA:  Follow-up small bowel obstruction, abdominal cramping all over EXAM: ABDOMEN - 2 VIEW COMPARISON:  CT 11/07/2015 FINDINGS: NG tube is in the stomach. Large volume stool within the colon. Oral contrast material within the right colon. No current visible dilated small bowel loops. No free air organomegaly. IMPRESSION: No current visible dilated small bowel loops. Electronically Signed   By: Rolm Baptise M.D.   On: 11/08/2015 09:52     CBC  Recent  Labs Lab 11/02/15 0838 11/05/15 1104 11/07/15 1256 11/08/15 0448  WBC 6.5 6.1 4.6 4.1  HGB 13.0 12.2* 12.7* 11.0*  HCT 39.3 37.6* 37.7* 33.6*  PLT 117* 81* 56* 45*  MCV 97.3 97.0 95.2 98.2  MCH 32.2 31.6 32.1 32.2  MCHC 33.0 32.6 33.7 32.7  RDW 17.6* 17.3* 16.7* 17.2*  LYMPHSABS 0.4* 0.4*  --  0.6*  MONOABS 0.3 0.4  --  0.9  EOSABS 0.0 0.0  --  0.1  BASOSABS 0.0 0.0  --  0.0    Chemistries   Recent Labs Lab 11/02/15 0920 11/07/15 1256 11/08/15 0448  NA 133* 135 138  K 4.3 4.2 4.4  CL  --  101 107  CO2 19* 23 25  GLUCOSE 78 101* 84  BUN 24.4 22* 17  CREATININE 1.3 1.30* 1.07  CALCIUM 9.4 9.4 8.2*  AST 25 26  --   ALT 32 36  --   ALKPHOS 81 81  --   BILITOT 0.34 0.4  --    ------------------------------------------------------------------------------------------------------------------ estimated creatinine clearance is 70.1 mL/min (by C-G formula based on Cr of 1.07). ------------------------------------------------------------------------------------------------------------------ No results for input(s): HGBA1C in the last 72 hours. ------------------------------------------------------------------------------------------------------------------ No results for input(s): CHOL, HDL, LDLCALC, TRIG, CHOLHDL, LDLDIRECT in the last 72 hours. ------------------------------------------------------------------------------------------------------------------ No results for input(s): TSH, T4TOTAL, T3FREE, THYROIDAB in the last 72 hours.  Invalid input(s): FREET3 ------------------------------------------------------------------------------------------------------------------ No results for input(s): VITAMINB12, FOLATE, FERRITIN, TIBC, IRON, RETICCTPCT in the last 72 hours.  Coagulation profile No results for input(s): INR, PROTIME in the last 168 hours.  No results for input(s): DDIMER in the last 72 hours.  Cardiac Enzymes No results for input(s): CKMB, TROPONINI,  MYOGLOBIN in the last 168 hours.  Invalid input(s): CK ------------------------------------------------------------------------------------------------------------------ Invalid input(s): POCBNP   CBG:  Recent Labs Lab 11/08/15 0723  GLUCAP 66       Studies: Ct Abdomen Pelvis W Contrast  11/07/2015  CLINICAL DATA:  Patient with multiple myeloma diagnosed October, 2012. Last chemotherapy treatment 11/05/2015. Lower abdominal pain and cramping today. Initial encounter. EXAM: CT ABDOMEN AND PELVIS WITH CONTRAST TECHNIQUE: Multidetector CT imaging of the abdomen and pelvis was performed using the standard protocol following bolus administration of intravenous contrast. CONTRAST:  25 mL OMNIPAQUE IOHEXOL 300 MG/ML SOLN, 80 mL OMNIPAQUE IOHEXOL 300 MG/ML SOLN COMPARISON:  PET CT scan 04/01/2013.  Osseous survey 09/06/2015. FINDINGS: The lung bases are clear. No pleural or pericardial effusion. Heart size is normal. Calcific coronary artery disease is seen. Small hiatal hernia is noted. A few small gallstones are identified there is no CT evidence of cholecystitis. The liver, spleen,  adrenal glands, kidneys and pancreas appear normal. Small bowel loops are dilated up to approximately 3.3 cm. Fecalized contents of small bowel are seen in the mid and right upper quadrant of the abdomen. Transition point appears to the in the right upper quadrant where an angulated loop of bowel is identified. Distal small bowel loops are completely decompressed. There is a small volume of free pelvic fluid. No pneumatosis, free intraperitoneal air or portal venous gas is identified. The patient has a right inguinal hernia containing a loop of small bowel but there is no transition point in this location. The colon demonstrates scattered diverticular disease without diverticulitis. There is a large volume of stool in the ascending and transverse colon. The appendix appears normal. No lymphadenopathy is identified.  Scattered aorto iliac atherosclerosis without aneurysm is seen. Bones demonstrate a mild, anterior superior endplate compression fracture of T12. Scattered punctate lucent lesions are identified throughout the spine and likely in the pelvis and hips consistent with the patient's history of multiple myeloma. IMPRESSION: The study is positive for small bowel obstruction with a transition poorly likely in the right upper quadrant of the abdomen where an angulated loop of bowel is identified. Right inguinal hernia contains a loop of small bowel but no associated complication such as obstruction. Prominent stool burden ascending and transverse colon. Diverticulosis without diverticulitis. Aortoiliac atherosclerosis. Gallstones without cholecystitis. Punctate lucent lesions in the bones are consistent with history of multiple myeloma. Mild superior endplate compression fracture of T12 is seen on the comparison osseous survey. Calcific coronary artery disease. Electronically Signed   By: Inge Rise M.D.   On: 11/07/2015 17:14   Dg Abd 2 Views  11/08/2015  CLINICAL DATA:  Follow-up small bowel obstruction, abdominal cramping all over EXAM: ABDOMEN - 2 VIEW COMPARISON:  CT 11/07/2015 FINDINGS: NG tube is in the stomach. Large volume stool within the colon. Oral contrast material within the right colon. No current visible dilated small bowel loops. No free air organomegaly. IMPRESSION: No current visible dilated small bowel loops. Electronically Signed   By: Rolm Baptise M.D.   On: 11/08/2015 09:52      Lab Results  Component Value Date   HGBA1C 5.4 03/27/2012   Lab Results  Component Value Date   LDLCALC 103* 07/08/2015   CREATININE 1.07 11/08/2015       Scheduled Meds: . antiseptic oral rinse  7 mL Mouth Rinse q12n4p  . chlorhexidine  15 mL Mouth Rinse BID  . cholecalciferol  1,000 Units Oral Daily  . escitalopram  10 mg Oral Daily  . ketoconazole  1 application Topical Daily  . levothyroxine   75 mcg Intravenous Daily  . pantoprazole (PROTONIX) IV  40 mg Intravenous Q24H  . valACYclovir  1,000 mg Oral Daily   Continuous Infusions: . dextrose 5 % and 0.9% NaCl 75 mL/hr at 11/08/15 1638    Principal Problem:   SBO (small bowel obstruction) (HCC) Active Problems:   ANEMIA   Peptic ulcer disease   Multiple myeloma (HCC)   Hypothyroidism   Thrombocytopenia (HCC)   Constipation   Chronic anemia    Time spent: 45 minutes   [facility] Pager [phone]. If 7PM-7AM, please contact night-coverage at [email or web address], password [facility] 11/08/2015, 10:55 AM  LOS: 1 day

## 2015-11-08 NOTE — Progress Notes (Signed)
Hypoglycemic Event  CBG: 66  Treatment: 25 ml of D50 IV  Symptoms: asymptomatic  Follow-up CBG: Time:0802  CBG Result: 102  Possible Reasons for Event: Pt NPO  Comments/MD notified: Paged MD D5 added to maintenance fluids    Andre [name]

## 2015-11-08 NOTE — Progress Notes (Signed)
Patient ID: Andre [name], male   DOB: March 06, 1951, 65 y.o.   MRN: [id number]    Subjective: Feels better today. Doesn't like the NG tube with congestion and sore throat. Denies abdominal pain. Has had flatus but no bowel movements.  Objective: Vital signs in last 24 hours: Temp:  [97.7 F (36.5 C)-98.5 F (36.9 C)] 97.7 F (36.5 C) (02/20 0525) Pulse Rate:  [56-71] 58 (02/20 0525) Resp:  [12-22] 16 (02/20 0525) BP: (113-147)/(64-81) 121/64 mmHg (02/20 0525) SpO2:  [95 %-100 %] 95 % (02/20 0525) Weight:  [84.369 kg (186 lb)] 84.369 kg (186 lb) (02/19 1950) Last BM Date: 11/06/15  Intake/Output from previous day: 02/19 0701 - 02/20 0700 In: 1256.3 [I.V.:1256.3] Out: 1660 [Urine:1350; Emesis/NG output:310] Intake/Output this shift:    General appearance: alert, cooperative and no distress GI: nondistended.  Mild right mid abdominal tenderness without guarding.  Lab Results:   Recent Labs  11/07/15 1256 11/08/15 0448  WBC 4.6 4.1  HGB 12.7* 11.0*  HCT 37.7* 33.6*  PLT 56* 45*   BMET  Recent Labs  11/07/15 1256 11/08/15 0448  NA 135 138  K 4.2 4.4  CL 101 107  CO2 23 25  GLUCOSE 101* 84  BUN 22* 17  CREATININE 1.30* 1.07  CALCIUM 9.4 8.2*     Studies/Results: Ct Abdomen Pelvis W Contrast  11/07/2015  CLINICAL DATA:  Patient with multiple myeloma diagnosed October, 2012. Last chemotherapy treatment 11/05/2015. Lower abdominal pain and cramping today. Initial encounter. EXAM: CT ABDOMEN AND PELVIS WITH CONTRAST TECHNIQUE: Multidetector CT imaging of the abdomen and pelvis was performed using the standard protocol following bolus administration of intravenous contrast. CONTRAST:  25 mL OMNIPAQUE IOHEXOL 300 MG/ML SOLN, 80 mL OMNIPAQUE IOHEXOL 300 MG/ML SOLN COMPARISON:  PET CT scan 04/01/2013.  Osseous survey 09/06/2015. FINDINGS: The lung bases are clear. No pleural or pericardial effusion. Heart size is normal. Calcific coronary artery disease is seen. Small  hiatal hernia is noted. A few small gallstones are identified there is no CT evidence of cholecystitis. The liver, spleen, adrenal glands, kidneys and pancreas appear normal. Small bowel loops are dilated up to approximately 3.3 cm. Fecalized contents of small bowel are seen in the mid and right upper quadrant of the abdomen. Transition point appears to the in the right upper quadrant where an angulated loop of bowel is identified. Distal small bowel loops are completely decompressed. There is a small volume of free pelvic fluid. No pneumatosis, free intraperitoneal air or portal venous gas is identified. The patient has a right inguinal hernia containing a loop of small bowel but there is no transition point in this location. The colon demonstrates scattered diverticular disease without diverticulitis. There is a large volume of stool in the ascending and transverse colon. The appendix appears normal. No lymphadenopathy is identified. Scattered aorto iliac atherosclerosis without aneurysm is seen. Bones demonstrate a mild, anterior superior endplate compression fracture of T12. Scattered punctate lucent lesions are identified throughout the spine and likely in the pelvis and hips consistent with the patient's history of multiple myeloma. IMPRESSION: The study is positive for small bowel obstruction with a transition poorly likely in the right upper quadrant of the abdomen where an angulated loop of bowel is identified. Right inguinal hernia contains a loop of small bowel but no associated complication such as obstruction. Prominent stool burden ascending and transverse colon. Diverticulosis without diverticulitis. Aortoiliac atherosclerosis. Gallstones without cholecystitis. Punctate lucent lesions in the bones are consistent with history of multiple  myeloma. Mild superior endplate compression fracture of T12 is seen on the comparison osseous survey. Calcific coronary artery disease. Electronically Signed   By:  Inge Rise M.D.   On: 11/07/2015 17:14    Anti-infectives: Anti-infectives    Start     Dose/Rate Route Frequency Ordered Stop   11/07/15 2000  valACYclovir (VALTREX) tablet 1,000 mg     1,000 mg Oral Daily 11/07/15 1903        Assessment/Plan: Small bowel obstruction with transition point in the right upper quadrant. Currently undergoing treatment for multiple myeloma. Etiology of his obstruction unclear, no previous surgery. Seems clinically improved today.  Repeat x-rays ordered. History of peptic ulcer disease Right inguinal hernia on CT without obstruction Probable gallstones on CT    LOS: 1 day    [name] T 11/08/2015

## 2015-11-09 ENCOUNTER — Other Ambulatory Visit: Payer: Self-pay | Admitting: *Deleted

## 2015-11-09 LAB — CBC WITH DIFFERENTIAL/PLATELET
BASOS ABS: 0 10*3/uL (ref 0.0–0.1)
BASOS PCT: 0 %
EOS ABS: 0.1 10*3/uL (ref 0.0–0.7)
EOS PCT: 3 %
HCT: 35.2 % — ABNORMAL LOW (ref 39.0–52.0)
Hemoglobin: 11.5 g/dL — ABNORMAL LOW (ref 13.0–17.0)
LYMPHS PCT: 26 %
Lymphs Abs: 0.9 10*3/uL (ref 0.7–4.0)
MCH: 32.2 pg (ref 26.0–34.0)
MCHC: 32.7 g/dL (ref 30.0–36.0)
MCV: 98.6 fL (ref 78.0–100.0)
MONO ABS: 0.6 10*3/uL (ref 0.1–1.0)
Monocytes Relative: 17 %
NEUTROS ABS: 1.8 10*3/uL (ref 1.7–7.7)
Neutrophils Relative %: 53 %
PLATELETS: 39 10*3/uL — AB (ref 150–400)
RBC: 3.57 MIL/uL — ABNORMAL LOW (ref 4.22–5.81)
RDW: 17.1 % — AB (ref 11.5–15.5)
WBC: 3.4 10*3/uL — ABNORMAL LOW (ref 4.0–10.5)

## 2015-11-09 LAB — COMPREHENSIVE METABOLIC PANEL
ALT: 27 U/L (ref 17–63)
AST: 20 U/L (ref 15–41)
Albumin: 3.2 g/dL — ABNORMAL LOW (ref 3.5–5.0)
Alkaline Phosphatase: 69 U/L (ref 38–126)
Anion gap: 5 (ref 5–15)
BILIRUBIN TOTAL: 0.3 mg/dL (ref 0.3–1.2)
BUN: 11 mg/dL (ref 6–20)
CHLORIDE: 109 mmol/L (ref 101–111)
CO2: 22 mmol/L (ref 22–32)
CREATININE: 1 mg/dL (ref 0.61–1.24)
Calcium: 8.5 mg/dL — ABNORMAL LOW (ref 8.9–10.3)
GLUCOSE: 85 mg/dL (ref 65–99)
Potassium: 4.3 mmol/L (ref 3.5–5.1)
Sodium: 136 mmol/L (ref 135–145)
Total Protein: 6.3 g/dL — ABNORMAL LOW (ref 6.5–8.1)

## 2015-11-09 LAB — GLUCOSE, CAPILLARY: Glucose-Capillary: 94 mg/dL (ref 65–99)

## 2015-11-09 LAB — APTT: APTT: 28 s (ref 24–37)

## 2015-11-09 LAB — PROTIME-INR
INR: 1.01 (ref 0.00–1.49)
Prothrombin Time: 13.5 seconds (ref 11.6–15.2)

## 2015-11-09 MED ORDER — PANTOPRAZOLE SODIUM 40 MG PO TBEC
40.0000 mg | DELAYED_RELEASE_TABLET | Freq: Every day | ORAL | Status: DC
  Administered 2015-11-09: 40 mg via ORAL
  Filled 2015-11-09: qty 1

## 2015-11-09 MED ORDER — POMALIDOMIDE 4 MG PO CAPS: ORAL_CAPSULE | ORAL | Status: DC

## 2015-11-09 MED ORDER — POLYETHYLENE GLYCOL 3350 17 G PO PACK
17.0000 g | PACK | Freq: Two times a day (BID) | ORAL | Status: DC
  Administered 2015-11-09: 17 g via ORAL
  Filled 2015-11-09: qty 1

## 2015-11-09 MED ORDER — PANTOPRAZOLE SODIUM 40 MG PO TBEC: 40.0000 mg | DELAYED_RELEASE_TABLET | Freq: Every day | ORAL | Status: DC

## 2015-11-09 MED ORDER — LEVOTHYROXINE SODIUM 150 MCG PO TABS
150.0000 ug | ORAL_TABLET | Freq: Every day | ORAL | Status: DC
Start: 2015-11-09 — End: 2015-11-09
  Administered 2015-11-09: 150 ug via ORAL
  Filled 2015-11-09 (×2): qty 1

## 2015-11-09 MED ORDER — BISACODYL 10 MG RE SUPP
10.0000 mg | Freq: Once | RECTAL | Status: AC
  Administered 2015-11-09: 10 mg via RECTAL
  Filled 2015-11-09: qty 1

## 2015-11-09 NOTE — Discharge Instructions (Signed)
Can advance diet slowly, maintaining a low residue diet for approximately 1 week and then slowly introducing high fiber food

## 2015-11-09 NOTE — Discharge Summary (Signed)
Physician Discharge Summary  Andre [name] MRN: [id number] DOB/AGE: 67-Oct-1952 67 y.o.  PCP: [name], MD   Admit date: 11/07/2015 Discharge date: 11/09/2015  Discharge Diagnoses:     Principal Problem:   SBO (small bowel obstruction) (HCC) Active Problems:   ANEMIA   Peptic ulcer disease   Multiple myeloma (HCC)   Hypothyroidism   Thrombocytopenia (HCC)   Constipation   Chronic anemia    Follow-up recommendations Follow-up with PCP in 3-5 days , including all  additional recommended appointments as below Follow-up CBC, CMP in 3-5 days Can advance diet slowly, maintaining a low residue diet for approximately 1 week and then slowly introducing high fiber food     Medication List    STOP taking these medications        aspirin EC 325 MG tablet     naproxen sodium 220 MG tablet  Commonly known as:  ANAPROX     omeprazole 40 MG capsule  Commonly known as:  PRILOSEC     sildenafil 100 MG tablet  Commonly known as:  VIAGRA      TAKE these medications        cetirizine 10 MG tablet  Commonly known as:  ZYRTEC  Take 10 mg by mouth daily.     cholecalciferol 1000 units tablet  Commonly known as:  VITAMIN D  Take 1 tablet (1,000 Units total) by mouth daily.     dexamethasone 6 MG tablet  Commonly known as:  DECADRON  Take 3 tablets by mouth with food as directed     DULCOLAX PO  Take 1 tablet by mouth daily as needed (for constipation). Reported on 11/05/2015     escitalopram 10 MG tablet  Commonly known as:  LEXAPRO  Take 1 tablet (10 mg total) by mouth daily.     ketoconazole 2 % cream  Commonly known as:  NIZORAL  Apply 1 application topically daily.     levothyroxine 150 MCG tablet  Commonly known as:  SYNTHROID, LEVOTHROID  Take 1 tablet by mouth  daily     pantoprazole 40 MG tablet  Commonly known as:  PROTONIX  Take 1 tablet (40 mg total) by mouth daily.     polyethylene glycol packet  Commonly known as:  MIRALAX / GLYCOLAX  Take  17 g by mouth daily.     pomalidomide 4 MG capsule  Commonly known as:  POMALYST  Take 1 capsule by mouth with water daily on days 1-14. Repeat every 21 days.     senna 8.6 MG tablet  Commonly known as:  SENOKOT  Take 1 tablet by mouth 2 (two) times daily.     valACYclovir 1000 MG tablet  Commonly known as:  VALTREX  Take 1 tablet (1,000 mg total) by mouth daily.     zolpidem 5 MG tablet  Commonly known as:  AMBIEN  Take 1-2 tablets (5-10 mg total) by mouth at bedtime as needed for sleep.         Discharge Condition: Stable   Discharge Instructions     Allergies  Allergen Reactions  . Atorvastatin Other (See Comments)  . Rosuvastatin     Other reaction(s): Liver Disorder  . Crestor [Rosuvastatin Calcium]     ADVERSE EFFECTS ON LIVER  . Lipitor [Atorvastatin Calcium]       Disposition: 01-Home or Self Care   Consults:  General surgery Hematology oncology     Significant Diagnostic Studies:  Ct Abdomen Pelvis W Contrast  11/07/2015  CLINICAL DATA:  Patient with multiple myeloma diagnosed October, 2012. Last chemotherapy treatment 11/05/2015. Lower abdominal pain and cramping today. Initial encounter. EXAM: CT ABDOMEN AND PELVIS WITH CONTRAST TECHNIQUE: Multidetector CT imaging of the abdomen and pelvis was performed using the standard protocol following bolus administration of intravenous contrast. CONTRAST:  25 mL OMNIPAQUE IOHEXOL 300 MG/ML SOLN, 80 mL OMNIPAQUE IOHEXOL 300 MG/ML SOLN COMPARISON:  PET CT scan 04/01/2013.  Osseous survey 09/06/2015. FINDINGS: The lung bases are clear. No pleural or pericardial effusion. Heart size is normal. Calcific coronary artery disease is seen. Small hiatal hernia is noted. A few small gallstones are identified there is no CT evidence of cholecystitis. The liver, spleen, adrenal glands, kidneys and pancreas appear normal. Small bowel loops are dilated up to approximately 3.3 cm. Fecalized contents of small bowel are seen in  the mid and right upper quadrant of the abdomen. Transition point appears to the in the right upper quadrant where an angulated loop of bowel is identified. Distal small bowel loops are completely decompressed. There is a small volume of free pelvic fluid. No pneumatosis, free intraperitoneal air or portal venous gas is identified. The patient has a right inguinal hernia containing a loop of small bowel but there is no transition point in this location. The colon demonstrates scattered diverticular disease without diverticulitis. There is a large volume of stool in the ascending and transverse colon. The appendix appears normal. No lymphadenopathy is identified. Scattered aorto iliac atherosclerosis without aneurysm is seen. Bones demonstrate a mild, anterior superior endplate compression fracture of T12. Scattered punctate lucent lesions are identified throughout the spine and likely in the pelvis and hips consistent with the patient's history of multiple myeloma. IMPRESSION: The study is positive for small bowel obstruction with a transition poorly likely in the right upper quadrant of the abdomen where an angulated loop of bowel is identified. Right inguinal hernia contains a loop of small bowel but no associated complication such as obstruction. Prominent stool burden ascending and transverse colon. Diverticulosis without diverticulitis. Aortoiliac atherosclerosis. Gallstones without cholecystitis. Punctate lucent lesions in the bones are consistent with history of multiple myeloma. Mild superior endplate compression fracture of T12 is seen on the comparison osseous survey. Calcific coronary artery disease. Electronically Signed   By: Drusilla Kanner M.D.   On: 11/07/2015 17:14   Dg Abd 2 Views  11/08/2015  CLINICAL DATA:  Follow-up small bowel obstruction, abdominal cramping all over EXAM: ABDOMEN - 2 VIEW COMPARISON:  CT 11/07/2015 FINDINGS: NG tube is in the stomach. Large volume stool within the colon.  Oral contrast material within the right colon. No current visible dilated small bowel loops. No free air organomegaly. IMPRESSION: No current visible dilated small bowel loops. Electronically Signed   By: Charlett Nose M.D.   On: 11/08/2015 09:52        Filed Weights   11/07/15 1950  Weight: 84.369 kg (186 lb)     Microbiology: No results found for this or any previous visit (from the past 240 hour(s)).     Blood Culture No results found for: SDES, SPECREQUEST, CULT, REPTSTATUS    Labs: Results for orders placed or performed during the hospital encounter of 11/07/15 (from the past 48 hour(s))  Lipase, blood     Status: Abnormal   Collection Time: 11/07/15 12:56 PM  Result Value Ref Range   Lipase 72 (H) 11 - 51 U/L  Comprehensive metabolic panel     Status: Abnormal   Collection Time: 11/07/15 12:56  PM  Result Value Ref Range   Sodium 135 135 - 145 mmol/L   Potassium 4.2 3.5 - 5.1 mmol/L   Chloride 101 101 - 111 mmol/L   CO2 23 22 - 32 mmol/L   Glucose, Bld 101 (H) 65 - 99 mg/dL   BUN 22 (H) 6 - 20 mg/dL   Creatinine, Ser 1.30 (H) 0.61 - 1.24 mg/dL   Calcium 9.4 8.9 - 10.3 mg/dL   Total Protein 6.8 6.5 - 8.1 g/dL   Albumin 3.7 3.5 - 5.0 g/dL   AST 26 15 - 41 U/L   ALT 36 17 - 63 U/L   Alkaline Phosphatase 81 38 - 126 U/L   Total Bilirubin 0.4 0.3 - 1.2 mg/dL   GFR calc non Af Amer 56 (L) >60 mL/min   GFR calc Af Amer >60 >60 mL/min    Comment: (NOTE) The eGFR has been calculated using the CKD EPI equation. This calculation has not been validated in all clinical situations. eGFR's persistently <60 mL/min signify possible Chronic Kidney Disease.    Anion gap 11 5 - 15  CBC     Status: Abnormal   Collection Time: 11/07/15 12:56 PM  Result Value Ref Range   WBC 4.6 4.0 - 10.5 K/uL   RBC 3.96 (L) 4.22 - 5.81 MIL/uL   Hemoglobin 12.7 (L) 13.0 - 17.0 g/dL   HCT 37.7 (L) 39.0 - 52.0 %   MCV 95.2 78.0 - 100.0 fL   MCH 32.1 26.0 - 34.0 pg   MCHC 33.7 30.0 - 36.0  g/dL   RDW 16.7 (H) 11.5 - 15.5 %   Platelets 56 (L) 150 - 400 K/uL    Comment: REPEATED TO VERIFY SPECIMEN CHECKED FOR CLOTS PLATELET COUNT CONFIRMED BY SMEAR   Urinalysis, Routine w reflex microscopic (not at [facility]     Status: Abnormal   Collection Time: 11/07/15  2:17 PM  Result Value Ref Range   Color, Urine YELLOW YELLOW   APPearance CLEAR CLEAR   Specific Gravity, Urine 1.009 1.005 - 1.030   pH 7.0 5.0 - 8.0   Glucose, UA NEGATIVE NEGATIVE mg/dL   Hgb urine dipstick SMALL (A) NEGATIVE   Bilirubin Urine NEGATIVE NEGATIVE   Ketones, ur NEGATIVE NEGATIVE mg/dL   Protein, ur NEGATIVE NEGATIVE mg/dL   Nitrite NEGATIVE NEGATIVE   Leukocytes, UA NEGATIVE NEGATIVE  Urine microscopic-add on     Status: Abnormal   Collection Time: 11/07/15  2:17 PM  Result Value Ref Range   Squamous Epithelial / LPF 0-5 (A) NONE SEEN   WBC, UA 0-5 0 - 5 WBC/hpf   RBC / HPF 6-30 0 - 5 RBC/hpf   Bacteria, UA RARE (A) NONE SEEN  Basic metabolic panel     Status: Abnormal   Collection Time: 11/08/15  4:48 AM  Result Value Ref Range   Sodium 138 135 - 145 mmol/L   Potassium 4.4 3.5 - 5.1 mmol/L   Chloride 107 101 - 111 mmol/L   CO2 25 22 - 32 mmol/L   Glucose, Bld 84 65 - 99 mg/dL   BUN 17 6 - 20 mg/dL   Creatinine, Ser 1.07 0.61 - 1.24 mg/dL   Calcium 8.2 (L) 8.9 - 10.3 mg/dL   GFR calc non Af Amer >60 >60 mL/min   GFR calc Af Amer >60 >60 mL/min    Comment: (NOTE) The eGFR has been calculated using the CKD EPI equation. This calculation has not been validated in all clinical situations. eGFR's  persistently <60 mL/min signify possible Chronic Kidney Disease.    Anion gap 6 5 - 15  CBC WITH DIFFERENTIAL     Status: Abnormal   Collection Time: 11/08/15  4:48 AM  Result Value Ref Range   WBC 4.1 4.0 - 10.5 K/uL   RBC 3.42 (L) 4.22 - 5.81 MIL/uL   Hemoglobin 11.0 (L) 13.0 - 17.0 g/dL   HCT 33.6 (L) 39.0 - 52.0 %   MCV 98.2 78.0 - 100.0 fL   MCH 32.2 26.0 - 34.0 pg   MCHC 32.7 30.0 - 36.0  g/dL   RDW 17.2 (H) 11.5 - 15.5 %   Platelets 45 (L) 150 - 400 K/uL    Comment: CONSISTENT WITH PREVIOUS RESULT   Neutrophils Relative % 60 %   Neutro Abs 2.5 1.7 - 7.7 K/uL   Lymphocytes Relative 15 %   Lymphs Abs 0.6 (L) 0.7 - 4.0 K/uL   Monocytes Relative 23 %   Monocytes Absolute 0.9 0.1 - 1.0 K/uL   Eosinophils Relative 3 %   Eosinophils Absolute 0.1 0.0 - 0.7 K/uL   Basophils Relative 0 %   Basophils Absolute 0.0 0.0 - 0.1 K/uL  Glucose, capillary     Status: None   Collection Time: 11/08/15  7:23 AM  Result Value Ref Range   Glucose-Capillary 66 65 - 99 mg/dL  Glucose, capillary     Status: Abnormal   Collection Time: 11/08/15  8:06 AM  Result Value Ref Range   Glucose-Capillary 102 (H) 65 - 99 mg/dL  Glucose, capillary     Status: None   Collection Time: 11/08/15 12:55 PM  Result Value Ref Range   Glucose-Capillary 81 65 - 99 mg/dL  CBC with Differential     Status: Abnormal   Collection Time: 11/09/15  4:49 AM  Result Value Ref Range   WBC 3.4 (L) 4.0 - 10.5 K/uL   RBC 3.57 (L) 4.22 - 5.81 MIL/uL   Hemoglobin 11.5 (L) 13.0 - 17.0 g/dL   HCT 35.2 (L) 39.0 - 52.0 %   MCV 98.6 78.0 - 100.0 fL   MCH 32.2 26.0 - 34.0 pg   MCHC 32.7 30.0 - 36.0 g/dL   RDW 17.1 (H) 11.5 - 15.5 %   Platelets 39 (L) 150 - 400 K/uL    Comment: CONSISTENT WITH PREVIOUS RESULT   Neutrophils Relative % 53 %   Neutro Abs 1.8 1.7 - 7.7 K/uL   Lymphocytes Relative 26 %   Lymphs Abs 0.9 0.7 - 4.0 K/uL   Monocytes Relative 17 %   Monocytes Absolute 0.6 0.1 - 1.0 K/uL   Eosinophils Relative 3 %   Eosinophils Absolute 0.1 0.0 - 0.7 K/uL   Basophils Relative 0 %   Basophils Absolute 0.0 0.0 - 0.1 K/uL  Protime-INR     Status: None   Collection Time: 11/09/15  4:49 AM  Result Value Ref Range   Prothrombin Time 13.5 11.6 - 15.2 seconds   INR 1.01 0.00 - 1.49  APTT     Status: None   Collection Time: 11/09/15  4:49 AM  Result Value Ref Range   aPTT 28 24 - 37 seconds  Comprehensive  metabolic panel     Status: Abnormal   Collection Time: 11/09/15  4:49 AM  Result Value Ref Range   Sodium 136 135 - 145 mmol/L   Potassium 4.3 3.5 - 5.1 mmol/L   Chloride 109 101 - 111 mmol/L   CO2 22 22 - 32 mmol/L  Glucose, Bld 85 65 - 99 mg/dL   BUN 11 6 - 20 mg/dL   Creatinine, Ser 1.00 0.61 - 1.24 mg/dL   Calcium 8.5 (L) 8.9 - 10.3 mg/dL   Total Protein 6.3 (L) 6.5 - 8.1 g/dL   Albumin 3.2 (L) 3.5 - 5.0 g/dL   AST 20 15 - 41 U/L   ALT 27 17 - 63 U/L   Alkaline Phosphatase 69 38 - 126 U/L   Total Bilirubin 0.3 0.3 - 1.2 mg/dL   GFR calc non Af Amer >60 >60 mL/min   GFR calc Af Amer >60 >60 mL/min    Comment: (NOTE) The eGFR has been calculated using the CKD EPI equation. This calculation has not been validated in all clinical situations. eGFR's persistently <60 mL/min signify possible Chronic Kidney Disease.    Anion gap 5 5 - 15  Glucose, capillary     Status: None   Collection Time: 11/09/15  7:56 AM  Result Value Ref Range   Glucose-Capillary 94 65 - 99 mg/dL     Lipid Panel     Component Value Date/Time   CHOL 181 07/08/2015 1050   TRIG 191.0* 07/08/2015 1050   HDL 38.90* 07/08/2015 1050   CHOLHDL 5 07/08/2015 1050   VLDL 38.2 07/08/2015 1050   LDLCALC 103* 07/08/2015 1050   LDLDIRECT 155.8 11/13/2012 0757     Lab Results  Component Value Date   HGBA1C 5.4 03/27/2012     Lab Results  Component Value Date   LDLCALC 103* 07/08/2015   CREATININE 1.00 11/09/2015     brief summary 65 y.o. male with PMH of hypertension, hyperlipidemia, hypothyroidism, peptic ulcer disease, and multiple myeloma diagnosed in 2011 and currently on chemotherapy and presents to the ED with several days of worsening constipation and concomitant development of epigastric discomfort. Patient is currently under treatment for his multiple myeloma with most recent treatment on 11/05/2015. He had been complaining of constipation at that time and had escalated his bowel regimen  which included daily MiraLAX and Colace. Despite this therapy, patient has had only one, very small bowel movement in the past several days.CT of the abdomen was obtained and is consistent with a small bowel obstruction with transition point in the right upper quadrant. NG tube was placed for comfort in the emergency department and the patient was given a dose of IV Protonix. Surgical consultation was requested by the EDP and Dr. Zella Richer Evaluated the patient.  assessment and plan   1. SBO - CT findings c/w SBO, transition point in RUQ  - No relevant surgical history  Patient was advanced to full liquid diet which he tolerated well Discontinued NGT , KUB this morning normal. Oral contrast in the colon Seen by general surgery, patient advance to full liquids Recommended to stay on a low-residue diet for 1 week and then slowly advance Continue aggressive constipation protocol   2. Multiple myeloma  - Diagnosed 2011, managed by Dr. [name]  - Under treatment with bortezomib, pamalidomide, and dexamethasone  Patient seen by Dr. [name] during this hospitalization Next chemotherapy treatment is on 2/28  3. Thrombocytopenia  - Likely chemotherapy-induced  56> 45> 39> , hematology oncology following - Hold pharmacologic VTE ppx, SCD only    4. Hypothyroidism - Appears to be stable  - Continue home-dose Synthroid 150 mcg qD   5. Peptic ulcer disease - Reports h/o bleeding peptic ulcer  Continue PPI  6. Anemia - Stable, likely secondary to chemotherapeutics vs chronic kidney disease  -  Hgb 12.7 on arrival, consistent with his apparent baseline  - No sign of active blood loss, monitoring   7. CKD stage III  - SCr 1.30 on admission, Now 1.0 Improved with IV hydration - Avoid nephrotoxins where possible        Discharge Exam:    Blood pressure 131/79, pulse 68, temperature 98 F (36.7 C), temperature source Oral, resp. rate 18, height _0   (1.778 m), weight 84.369 kg (186 lb), SpO2 99 %.   General: No acute respiratory distress Lungs: Clear to auscultation bilaterally without wheezes or crackles Cardiovascular: Regular rate and rhythm without murmur gallop or rub normal S1 and S2 Abd: Soft, nondistended, mild tenderness across upper quadrants, no rebound pain or gaurding Extremities: No significant cyanosis, clubbing, or edema bilateral lower extremities       Follow-up Information    Follow up with [name], MD. Schedule an appointment as soon as possible for a visit in 3 days.   Specialty:  Internal Medicine   Contact information:   [place] [address] [phone]       Signed: NAQUAN, GARMAN 11/09/2015, 10:40 AM        Time spent >45 mins

## 2015-11-09 NOTE — Progress Notes (Signed)
Patient ID: Andre [name], male   DOB: 09/09/1951, 65 y.o.   MRN: [id number]     [facility]      [address] [place], [place] [address]    Phone: [phone] FAX: [phone]     Subjective: No n/v.  No bm, passing flatus.  Tolerating clears.   Objective:  Vital signs:  Filed Vitals:   11/08/15 0525 11/08/15 1524 11/08/15 2209 11/09/15 0524  BP: 121/64 143/82 133/70 131/79  Pulse: 58 58 63 68  Temp: 97.7 F (36.5 C) 98.1 F (36.7 C) 97.9 F (36.6 C) 98 F (36.7 C)  TempSrc: Oral Oral Oral Oral  Resp: _0 Height:      Weight:      SpO2: 95% 98% 95% 99%    Last BM Date: 11/06/15  Intake/Output   Yesterday:  02/20 0701 - 02/21 0700 In: 938.3 [I.V.:938.3] Out: 710 [Urine:700; Emesis/NG output:10] This shift:    I/O last 3 completed shifts: In: 2194.6 [I.V.:2194.6] Out: 2370 [Urine:2050; Emesis/NG output:320]   Physical Exam: General: Pt awake/alert/oriented x4 in no acute distress Abdomen: Soft.  Nondistended.  Non tender.  No evidence of peritonitis.  No incarcerated hernias.    Problem List:   Principal Problem:   SBO (small bowel obstruction) (HCC) Active Problems:   ANEMIA   Peptic ulcer disease   Multiple myeloma (HCC)   Hypothyroidism   Thrombocytopenia (HCC)   Constipation   Chronic anemia    Results:   Labs: Results for orders placed or performed during the hospital encounter of 11/07/15 (from the past 48 hour(s))  Lipase, blood     Status: Abnormal   Collection Time: 11/07/15 12:56 PM  Result Value Ref Range   Lipase 72 (H) 11 - 51 U/L  Comprehensive metabolic panel     Status: Abnormal   Collection Time: 11/07/15 12:56 PM  Result Value Ref Range   Sodium 135 135 - 145 mmol/L   Potassium 4.2 3.5 - 5.1 mmol/L   Chloride 101 101 - 111 mmol/L   CO2 23 22 - 32 mmol/L   Glucose, Bld 101 (H) 65 - 99 mg/dL   BUN 22 (H) 6 - 20 mg/dL   Creatinine, Ser 1.30 (H) 0.61 - 1.24 mg/dL    Calcium 9.4 8.9 - 10.3 mg/dL   Total Protein 6.8 6.5 - 8.1 g/dL   Albumin 3.7 3.5 - 5.0 g/dL   AST 26 15 - 41 U/L   ALT 36 17 - 63 U/L   Alkaline Phosphatase 81 38 - 126 U/L   Total Bilirubin 0.4 0.3 - 1.2 mg/dL   GFR calc non Af Amer 56 (L) >60 mL/min   GFR calc Af Amer >60 >60 mL/min    Comment: (NOTE) The eGFR has been calculated using the CKD EPI equation. This calculation has not been validated in all clinical situations. eGFR's persistently <60 mL/min signify possible Chronic Kidney Disease.    Anion gap 11 5 - 15  CBC     Status: Abnormal   Collection Time: 11/07/15 12:56 PM  Result Value Ref Range   WBC 4.6 4.0 - 10.5 K/uL   RBC 3.96 (L) 4.22 - 5.81 MIL/uL   Hemoglobin 12.7 (L) 13.0 - 17.0 g/dL   HCT 37.7 (L) 39.0 - 52.0 %   MCV 95.2 78.0 - 100.0 fL   MCH 32.1 26.0 - 34.0 pg   MCHC 33.7 30.0 - 36.0 g/dL   RDW  16.7 (H) 11.5 - 15.5 %   Platelets 56 (L) 150 - 400 K/uL    Comment: REPEATED TO VERIFY SPECIMEN CHECKED FOR CLOTS PLATELET COUNT CONFIRMED BY SMEAR   Urinalysis, Routine w reflex microscopic (not at [facility])     Status: Abnormal   Collection Time: 11/07/15  2:17 PM  Result Value Ref Range   Color, Urine YELLOW YELLOW   APPearance CLEAR CLEAR   Specific Gravity, Urine 1.009 1.005 - 1.030   pH 7.0 5.0 - 8.0   Glucose, UA NEGATIVE NEGATIVE mg/dL   Hgb urine dipstick SMALL (A) NEGATIVE   Bilirubin Urine NEGATIVE NEGATIVE   Ketones, ur NEGATIVE NEGATIVE mg/dL   Protein, ur NEGATIVE NEGATIVE mg/dL   Nitrite NEGATIVE NEGATIVE   Leukocytes, UA NEGATIVE NEGATIVE  Urine microscopic-add on     Status: Abnormal   Collection Time: 11/07/15  2:17 PM  Result Value Ref Range   Squamous Epithelial / LPF 0-5 (A) NONE SEEN   WBC, UA 0-5 0 - 5 WBC/hpf   RBC / HPF 6-30 0 - 5 RBC/hpf   Bacteria, UA RARE (A) NONE SEEN  Basic metabolic panel     Status: Abnormal   Collection Time: 11/08/15  4:48 AM  Result Value Ref Range   Sodium 138 135 - 145 mmol/L   Potassium 4.4  3.5 - 5.1 mmol/L   Chloride 107 101 - 111 mmol/L   CO2 25 22 - 32 mmol/L   Glucose, Bld 84 65 - 99 mg/dL   BUN 17 6 - 20 mg/dL   Creatinine, Ser 1.07 0.61 - 1.24 mg/dL   Calcium 8.2 (L) 8.9 - 10.3 mg/dL   GFR calc non Af Amer >60 >60 mL/min   GFR calc Af Amer >60 >60 mL/min    Comment: (NOTE) The eGFR has been calculated using the CKD EPI equation. This calculation has not been validated in all clinical situations. eGFR's persistently <60 mL/min signify possible Chronic Kidney Disease.    Anion gap 6 5 - 15  CBC WITH DIFFERENTIAL     Status: Abnormal   Collection Time: 11/08/15  4:48 AM  Result Value Ref Range   WBC 4.1 4.0 - 10.5 K/uL   RBC 3.42 (L) 4.22 - 5.81 MIL/uL   Hemoglobin 11.0 (L) 13.0 - 17.0 g/dL   HCT 33.6 (L) 39.0 - 52.0 %   MCV 98.2 78.0 - 100.0 fL   MCH 32.2 26.0 - 34.0 pg   MCHC 32.7 30.0 - 36.0 g/dL   RDW 17.2 (H) 11.5 - 15.5 %   Platelets 45 (L) 150 - 400 K/uL    Comment: CONSISTENT WITH PREVIOUS RESULT   Neutrophils Relative % 60 %   Neutro Abs 2.5 1.7 - 7.7 K/uL   Lymphocytes Relative 15 %   Lymphs Abs 0.6 (L) 0.7 - 4.0 K/uL   Monocytes Relative 23 %   Monocytes Absolute 0.9 0.1 - 1.0 K/uL   Eosinophils Relative 3 %   Eosinophils Absolute 0.1 0.0 - 0.7 K/uL   Basophils Relative 0 %   Basophils Absolute 0.0 0.0 - 0.1 K/uL  Glucose, capillary     Status: None   Collection Time: 11/08/15  7:23 AM  Result Value Ref Range   Glucose-Capillary 66 65 - 99 mg/dL  Glucose, capillary     Status: Abnormal   Collection Time: 11/08/15  8:06 AM  Result Value Ref Range   Glucose-Capillary 102 (H) 65 - 99 mg/dL  Glucose, capillary     Status:  None   Collection Time: 11/08/15 12:55 PM  Result Value Ref Range   Glucose-Capillary 81 65 - 99 mg/dL  CBC with Differential     Status: Abnormal   Collection Time: 11/09/15  4:49 AM  Result Value Ref Range   WBC 3.4 (L) 4.0 - 10.5 K/uL   RBC 3.57 (L) 4.22 - 5.81 MIL/uL   Hemoglobin 11.5 (L) 13.0 - 17.0 g/dL   HCT  35.2 (L) 39.0 - 52.0 %   MCV 98.6 78.0 - 100.0 fL   MCH 32.2 26.0 - 34.0 pg   MCHC 32.7 30.0 - 36.0 g/dL   RDW 17.1 (H) 11.5 - 15.5 %   Platelets 39 (L) 150 - 400 K/uL    Comment: CONSISTENT WITH PREVIOUS RESULT   Neutrophils Relative % 53 %   Neutro Abs 1.8 1.7 - 7.7 K/uL   Lymphocytes Relative 26 %   Lymphs Abs 0.9 0.7 - 4.0 K/uL   Monocytes Relative 17 %   Monocytes Absolute 0.6 0.1 - 1.0 K/uL   Eosinophils Relative 3 %   Eosinophils Absolute 0.1 0.0 - 0.7 K/uL   Basophils Relative 0 %   Basophils Absolute 0.0 0.0 - 0.1 K/uL  Protime-INR     Status: None   Collection Time: 11/09/15  4:49 AM  Result Value Ref Range   Prothrombin Time 13.5 11.6 - 15.2 seconds   INR 1.01 0.00 - 1.49  APTT     Status: None   Collection Time: 11/09/15  4:49 AM  Result Value Ref Range   aPTT 28 24 - 37 seconds  Comprehensive metabolic panel     Status: Abnormal   Collection Time: 11/09/15  4:49 AM  Result Value Ref Range   Sodium 136 135 - 145 mmol/L   Potassium 4.3 3.5 - 5.1 mmol/L   Chloride 109 101 - 111 mmol/L   CO2 22 22 - 32 mmol/L   Glucose, Bld 85 65 - 99 mg/dL   BUN 11 6 - 20 mg/dL   Creatinine, Ser 1.00 0.61 - 1.24 mg/dL   Calcium 8.5 (L) 8.9 - 10.3 mg/dL   Total Protein 6.3 (L) 6.5 - 8.1 g/dL   Albumin 3.2 (L) 3.5 - 5.0 g/dL   AST 20 15 - 41 U/L   ALT 27 17 - 63 U/L   Alkaline Phosphatase 69 38 - 126 U/L   Total Bilirubin 0.3 0.3 - 1.2 mg/dL   GFR calc non Af Amer >60 >60 mL/min   GFR calc Af Amer >60 >60 mL/min    Comment: (NOTE) The eGFR has been calculated using the CKD EPI equation. This calculation has not been validated in all clinical situations. eGFR's persistently <60 mL/min signify possible Chronic Kidney Disease.    Anion gap 5 5 - 15  Glucose, capillary     Status: None   Collection Time: 11/09/15  7:56 AM  Result Value Ref Range   Glucose-Capillary 94 65 - 99 mg/dL    Imaging / Studies: Ct Abdomen Pelvis W Contrast  11/07/2015  CLINICAL DATA:  Patient  with multiple myeloma diagnosed October, 2012. Last chemotherapy treatment 11/05/2015. Lower abdominal pain and cramping today. Initial encounter. EXAM: CT ABDOMEN AND PELVIS WITH CONTRAST TECHNIQUE: Multidetector CT imaging of the abdomen and pelvis was performed using the standard protocol following bolus administration of intravenous contrast. CONTRAST:  25 mL OMNIPAQUE IOHEXOL 300 MG/ML SOLN, 80 mL OMNIPAQUE IOHEXOL 300 MG/ML SOLN COMPARISON:  PET CT scan 04/01/2013.  Osseous survey 09/06/2015. FINDINGS:  The lung bases are clear. No pleural or pericardial effusion. Heart size is normal. Calcific coronary artery disease is seen. Small hiatal hernia is noted. A few small gallstones are identified there is no CT evidence of cholecystitis. The liver, spleen, adrenal glands, kidneys and pancreas appear normal. Small bowel loops are dilated up to approximately 3.3 cm. Fecalized contents of small bowel are seen in the mid and right upper quadrant of the abdomen. Transition point appears to the in the right upper quadrant where an angulated loop of bowel is identified. Distal small bowel loops are completely decompressed. There is a small volume of free pelvic fluid. No pneumatosis, free intraperitoneal air or portal venous gas is identified. The patient has a right inguinal hernia containing a loop of small bowel but there is no transition point in this location. The colon demonstrates scattered diverticular disease without diverticulitis. There is a large volume of stool in the ascending and transverse colon. The appendix appears normal. No lymphadenopathy is identified. Scattered aorto iliac atherosclerosis without aneurysm is seen. Bones demonstrate a mild, anterior superior endplate compression fracture of T12. Scattered punctate lucent lesions are identified throughout the spine and likely in the pelvis and hips consistent with the patient's history of multiple myeloma. IMPRESSION: The study is positive for small  bowel obstruction with a transition poorly likely in the right upper quadrant of the abdomen where an angulated loop of bowel is identified. Right inguinal hernia contains a loop of small bowel but no associated complication such as obstruction. Prominent stool burden ascending and transverse colon. Diverticulosis without diverticulitis. Aortoiliac atherosclerosis. Gallstones without cholecystitis. Punctate lucent lesions in the bones are consistent with history of multiple myeloma. Mild superior endplate compression fracture of T12 is seen on the comparison osseous survey. Calcific coronary artery disease. Electronically Signed   By: Inge Rise M.D.   On: 11/07/2015 17:14   Dg Abd 2 Views  11/08/2015  CLINICAL DATA:  Follow-up small bowel obstruction, abdominal cramping all over EXAM: ABDOMEN - 2 VIEW COMPARISON:  CT 11/07/2015 FINDINGS: NG tube is in the stomach. Large volume stool within the colon. Oral contrast material within the right colon. No current visible dilated small bowel loops. No free air organomegaly. IMPRESSION: No current visible dilated small bowel loops. Electronically Signed   By: Rolm Baptise M.D.   On: 11/08/2015 09:52    Medications / Allergies:  Scheduled Meds: . antiseptic oral rinse  7 mL Mouth Rinse q12n4p  . chlorhexidine  15 mL Mouth Rinse BID  . cholecalciferol  1,000 Units Oral Daily  . escitalopram  10 mg Oral Daily  . ketoconazole  1 application Topical Daily  . levothyroxine  150 mcg Oral QAC breakfast  . pantoprazole  40 mg Oral Daily  . valACYclovir  1,000 mg Oral Daily   Continuous Infusions: . dextrose 5 % and 0.9% NaCl 100 mL/hr at 11/09/15 0000   PRN Meds:.acetaminophen **OR** acetaminophen, HYDROcodone-acetaminophen, menthol-cetylpyridinium, morphine injection, ondansetron **OR** ondansetron (ZOFRAN) IV, phenol, zolpidem  Antibiotics: Anti-infectives    Start     Dose/Rate Route Frequency Ordered Stop   11/07/15 2000  valACYclovir (VALTREX)  tablet 1,000 mg     1,000 mg Oral Daily 11/07/15 1903          Assessment/Plan SBO with transition point RUQ-radiologically and clinically resolved.  Advance to fulls.  Can advance diet slowly, maintaining a low residue diet for approximately 1 week and then slowly introducing high fiber food. He is to start chemo next Tuesday  which causes him constipation, may resume miralax/senna if doing okay clinically.  Discharge later today if he remains clinically stable.    Erby Pian, [facility] Pager [phone])   11/09/2015 10:17 AM

## 2015-11-11 ENCOUNTER — Telehealth: Payer: Self-pay | Admitting: *Deleted

## 2015-11-11 NOTE — Telephone Encounter (Signed)
Transition Care Management Follow-up Telephone Call   Date discharged? 11/09/15   How have you been since you were released from the hospital? Pt states he is doing fine   Do you understand why you were in the hospital? YES   Do you understand the discharge instructions? YES   Where were you discharged to? Home   Items Reviewed:  Medications reviewed: YES  Allergies reviewed: YES  Dietary changes reviewed: YES  Referrals reviewed: No referral needed   Functional Questionnaire:   Activities of Daily Living (ADLs):   He states he are independent in the following: ambulation, bathing and hygiene, feeding, continence, grooming, toileting and dressing States he doesn't require assistance    Any transportation issues/concerns?: NO   Any patient concerns? NO   Confirmed importance and date/time of follow-up visits scheduled YES, appt 11/17/15  Provider Appointment booked with Terri Piedra  Confirmed with patient if condition begins to worsen call PCP or go to the ER.  Patient was given the office number and encouraged to call back with question or concerns.  : YES

## 2015-11-14 ENCOUNTER — Encounter: Payer: Self-pay | Admitting: Nurse Practitioner

## 2015-11-16 ENCOUNTER — Telehealth: Payer: Self-pay | Admitting: Oncology

## 2015-11-16 ENCOUNTER — Ambulatory Visit (HOSPITAL_BASED_OUTPATIENT_CLINIC_OR_DEPARTMENT_OTHER): Payer: Commercial Managed Care - PPO | Admitting: Oncology

## 2015-11-16 ENCOUNTER — Other Ambulatory Visit: Payer: Self-pay | Admitting: Oncology

## 2015-11-16 ENCOUNTER — Other Ambulatory Visit (HOSPITAL_BASED_OUTPATIENT_CLINIC_OR_DEPARTMENT_OTHER): Payer: Commercial Managed Care - PPO

## 2015-11-16 ENCOUNTER — Ambulatory Visit (HOSPITAL_BASED_OUTPATIENT_CLINIC_OR_DEPARTMENT_OTHER): Payer: Commercial Managed Care - PPO

## 2015-11-16 VITALS — BP 138/79 | HR 71 | Temp 97.8°F | Resp 18 | Ht 70.0 in | Wt 187.1 lb

## 2015-11-16 DIAGNOSIS — C9002 Multiple myeloma in relapse: Secondary | ICD-10-CM

## 2015-11-16 DIAGNOSIS — C9000 Multiple myeloma not having achieved remission: Secondary | ICD-10-CM

## 2015-11-16 DIAGNOSIS — Z5111 Encounter for antineoplastic chemotherapy: Secondary | ICD-10-CM | POA: Diagnosis not present

## 2015-11-16 LAB — COMPREHENSIVE METABOLIC PANEL
ALT: 27 U/L (ref 0–55)
ANION GAP: 11 meq/L (ref 3–11)
AST: 18 U/L (ref 5–34)
Albumin: 3.3 g/dL — ABNORMAL LOW (ref 3.5–5.0)
Alkaline Phosphatase: 96 U/L (ref 40–150)
BUN: 19.5 mg/dL (ref 7.0–26.0)
CALCIUM: 9.4 mg/dL (ref 8.4–10.4)
CHLORIDE: 108 meq/L (ref 98–109)
CO2: 19 meq/L — AB (ref 22–29)
Creatinine: 1.4 mg/dL — ABNORMAL HIGH (ref 0.7–1.3)
EGFR: 51 mL/min/{1.73_m2} — AB (ref >90)
Glucose: 99 mg/dl (ref 70–140)
POTASSIUM: 4.3 meq/L (ref 3.5–5.1)
Sodium: 137 mEq/L (ref 136–145)
Total Bilirubin: 0.35 mg/dL (ref 0.20–1.20)
Total Protein: 7 g/dL (ref 6.4–8.3)

## 2015-11-16 LAB — CBC WITH DIFFERENTIAL/PLATELET
BASO%: 1 % (ref 0.0–2.0)
BASOS ABS: 0 10*3/uL (ref 0.0–0.1)
EOS%: 0.5 % (ref 0.0–7.0)
Eosinophils Absolute: 0 10*3/uL (ref 0.0–0.5)
HEMATOCRIT: 32.9 % — AB (ref 38.4–49.9)
HGB: 11.2 g/dL — ABNORMAL LOW (ref 13.0–17.1)
LYMPH%: 26.6 % (ref 14.0–49.0)
MCH: 32.6 pg (ref 27.2–33.4)
MCHC: 34 g/dL (ref 32.0–36.0)
MCV: 95.6 fL (ref 79.3–98.0)
MONO#: 0.2 10*3/uL (ref 0.1–0.9)
MONO%: 11.1 % (ref 0.0–14.0)
NEUT#: 1.2 10*3/uL — ABNORMAL LOW (ref 1.5–6.5)
NEUT%: 60.8 % (ref 39.0–75.0)
PLATELETS: 63 10*3/uL — AB (ref 140–400)
RBC: 3.44 10*6/uL — AB (ref 4.20–5.82)
RDW: 17.5 % — AB (ref 11.0–14.6)
WBC: 2 10*3/uL — ABNORMAL LOW (ref 4.0–10.3)
lymph#: 0.5 10*3/uL — ABNORMAL LOW (ref 0.9–3.3)

## 2015-11-16 MED ORDER — PROCHLORPERAZINE MALEATE 10 MG PO TABS
ORAL_TABLET | ORAL | Status: AC
  Filled 2015-11-16: qty 1

## 2015-11-16 MED ORDER — BORTEZOMIB CHEMO SQ INJECTION 3.5 MG (2.5MG/ML)
1.3000 mg/m2 | Freq: Once | INTRAMUSCULAR | Status: AC
  Administered 2015-11-16: 2.75 mg via SUBCUTANEOUS
  Filled 2015-11-16: qty 2.75

## 2015-11-16 MED ORDER — PROCHLORPERAZINE MALEATE 10 MG PO TABS
10.0000 mg | ORAL_TABLET | Freq: Once | ORAL | Status: AC
  Administered 2015-11-16: 10 mg via ORAL

## 2015-11-16 NOTE — Progress Notes (Signed)
VO given and read back with Dr. [name].  OK to treat patient despite CBC and CMET results from today.  Patient states he took his decadron at home this morning.  He also denies any sleepiness or dizziness with the oral compazine.

## 2015-11-16 NOTE — Patient Instructions (Signed)
[facility] Discharge Instructions for Patients Receiving Chemotherapy  Today you received the following chemotherapy agents Velcade To help prevent nausea and vomiting after your treatment, we encourage you to take your nausea medication as prescribed.  If you develop nausea and vomiting that is not controlled by your nausea medication, call the clinic.   BELOW ARE SYMPTOMS THAT SHOULD BE REPORTED IMMEDIATELY:  *FEVER GREATER THAN 100.5 F  *CHILLS WITH OR WITHOUT FEVER  NAUSEA AND VOMITING THAT IS NOT CONTROLLED WITH YOUR NAUSEA MEDICATION  *UNUSUAL SHORTNESS OF BREATH  *UNUSUAL BRUISING OR BLEEDING  TENDERNESS IN MOUTH AND THROAT WITH OR WITHOUT PRESENCE OF ULCERS  *URINARY PROBLEMS  *BOWEL PROBLEMS  UNUSUAL RASH Items with * indicate a potential emergency and should be followed up as soon as possible.  Feel free to call the clinic you have any questions or concerns. The clinic phone number is [phone].  Please show the CHEMO ALERT CARD at check-in to the Emergency Department and triage nurse.   Bortezomib injection (Velcade) What is this medicine? BORTEZOMIB (bor TEZ oh mib) is a medicine that targets proteins in cancer cells and stops the cancer cells from growing. It is used to treat multiple myeloma and mantle-cell lymphoma. This medicine may be used for other purposes; ask your health care provider or pharmacist if you have questions. What should I tell my health care provider before I take this medicine? They need to know if you have any of these conditions: -diabetes -heart disease -irregular heartbeat -liver disease -on hemodialysis -low blood counts, like low white blood cells, platelets, or hemoglobin -peripheral neuropathy -taking medicine for blood pressure -an unusual or allergic reaction to bortezomib, mannitol, boron, other medicines, foods, dyes, or preservatives -pregnant or trying to get pregnant -breast-feeding How should I  use this medicine? This medicine is for injection into a vein or for injection under the skin. It is given by a health care professional in a hospital or clinic setting. Talk to your pediatrician regarding the use of this medicine in children. Special care may be needed. Overdosage: If you think you have taken too much of this medicine contact a poison control center or emergency room at once. NOTE: This medicine is only for you. Do not share this medicine with others. What if I miss a dose? It is important not to miss your dose. Call your doctor or health care professional if you are unable to keep an appointment. What may interact with this medicine? This medicine may interact with the following medications: -ketoconazole -rifampin -ritonavir -St. [name]'s Wort This list may not describe all possible interactions. Give your health care provider a list of all the medicines, herbs, non-prescription drugs, or dietary supplements you use. Also tell them if you smoke, drink alcohol, or use illegal drugs. Some items may interact with your medicine. What should I watch for while using this medicine? Visit your doctor for checks on your progress. This drug may make you feel generally unwell. This is not uncommon, as chemotherapy can affect healthy cells as well as cancer cells. Report any side effects. Continue your course of treatment even though you feel ill unless your doctor tells you to stop. You may get drowsy or dizzy. Do not drive, use machinery, or do anything that needs mental alertness until you know how this medicine affects you. Do not stand or sit up quickly, especially if you are an older patient. This reduces the risk of dizzy or fainting spells. In some   cases, you may be given additional medicines to help with side effects. Follow all directions for their use. Call your doctor or health care professional for advice if you get a fever, chills or sore throat, or other symptoms of a cold or  flu. Do not treat yourself. This drug decreases your body's ability to fight infections. Try to avoid being around people who are sick. This medicine may increase your risk to bruise or bleed. Call your doctor or health care professional if you notice any unusual bleeding. You may need blood work done while you are taking this medicine. In some patients, this medicine may cause a serious brain infection that may cause death. If you have any problems seeing, thinking, speaking, walking, or standing, tell your doctor right away. If you cannot reach your doctor, urgently seek other source of medical care. Do not become pregnant while taking this medicine. Women should inform their doctor if they wish to become pregnant or think they might be pregnant. There is a potential for serious side effects to an unborn child. Talk to your health care professional or pharmacist for more information. Do not breast-feed an infant while taking this medicine. Check with your doctor or health care professional if you get an attack of severe diarrhea, nausea and vomiting, or if you sweat a lot. The loss of too much body fluid can make it dangerous for you to take this medicine. What side effects may I notice from receiving this medicine? Side effects that you should report to your doctor or health care professional as soon as possible: -allergic reactions like skin rash, itching or hives, swelling of the face, lips, or tongue -breathing problems -changes in hearing -changes in vision -fast, irregular heartbeat -feeling faint or lightheaded, falls -pain, tingling, numbness in the hands or feet -right upper belly pain -seizures -swelling of the ankles, feet, hands -unusual bleeding or bruising -unusually weak or tired -vomiting -yellowing of the eyes or skin Side effects that usually do not require medical attention (report to your doctor or health care professional if they continue or are bothersome): -changes in  emotions or moods -constipation -diarrhea -loss of appetite -headache -irritation at site where injected -nausea This list may not describe all possible side effects. Call your doctor for medical advice about side effects. You may report side effects to FDA at 1-800-FDA-1088. Where should I keep my medicine? This drug is given in a hospital or clinic and will not be stored at home. NOTE: This sheet is a summary. It may not cover all possible information. If you have questions about this medicine, talk to your doctor, pharmacist, or health care provider.    2016, [employer]. (2014-11-03 14:47:04)  

## 2015-11-16 NOTE — Telephone Encounter (Signed)
appt made and avs printed °

## 2015-11-16 NOTE — Progress Notes (Signed)
ID: Andre [name]   DOB: 1951-04-01  MR#: [id number]  YCX#:[id number]   PCP: [name] SU: OTHER MD: [name], [name], [address] [place] [phone]), Ardine Bjork  CHIEF COMPLAINT: Multiple myeloma  CURRENT TREATMENT: Bortezomib, pomalidomide, dexamethasone   HISTORY OF PRESENT ILLNESS: From the original intake nodes:  The patient was worked up for peptic ulcer disease in August of 2011, with significant bleeding and anemia.  The patient was Helicobacter pylori negative.  He received some epinephrine when he had his EGD and then started on Protonix.  [name] anemia slowly resolved so that by September, his hemoglobin was up to 10.9 and by earlier this month, his hemoglobin was up to 12.5.    As part of his general workup, he was found to have a slightly elevated globulin fraction.  In September, his total protein was 8.3 with an albumin of 3.8.  In January, the total protein was 8.4 and albumin 3.6.  With persistence of this slight abnormality, Dr. Olevia Perches obtained serum immunofixation and SPEP.  The SPTP showed an M-spike of 2.67 grams.  A total IgG was 4,190.  Total IgA low at 28.  Total IgM low at 28 also.  The immunofixation showed a monoclonal IgG lambda paraprotein.  There were also monoclonal free lambda light chains present. With this information, the patient was referred for further evaluation.   A diagnosis of myeloma was confirmed by bone marrow biopsy and subsequebnt treatment is as detailed below  INTERVAL HISTORY: Andre [name] returns today for follow-up of his relapsed  multiple myeloma. Today is day 1, cycle 2 of bortezomib which he receives on days 1, 4, 8 and 11 every 21 days. He is also on pomalidomide which he resumed yesterday and dexamethasone which he takes on bortezomib days.  Since his last visit here, Andre [name] had an emergent admission to call Hospital for apparent bowel obstruction. An area of likely obstruction was seen on  his CT scan, but he was not confirmed the next day on plain film, and he continued to pass gas and had several bowel movements. Since going home he has altered his diet, so he is eating more fiber, and drinking less caffeine. He also interrupted the lenalidomide for about a week him a restarting it again yesterday. He also took MiraLAX yesterday just to make sure   REVIEW OF SYSTEMS: Andre [name] has had no fever or bleeding problems periods he gets some mouth irritation at times but no frank ulcers. He has some ringing in his ears, feels forgetful, has low back pain which is not more persistent or intense than before, and has insomnia problems. There has been no neuropathy. A detailed review of systems today was otherwise noncontributory.  PAST MEDICAL HISTORY: Past Medical History  Diagnosis Date  . Hypertension   . Hyperlipidemia   . Duodenal ulcer   . Allergy   . Thyroid disease   . GERD (gastroesophageal reflux disease)   . Depression   . Anemia   . Hypothyroidism   . Asthma     no treatment x 20 years  . Arthritis   . Double vision     occurs at times   . Multiple myeloma 07/04/2011  Significant for peptic ulcer disease as noted above.  History of hyperlipidemia.  History of anxiety and depression.  History of GERD which is significantly improved with weight loss and history of reactive airway disease, possibly secondary to the GERD which also improved with weight loss.  There was a history of obesity, now much improved secondary to exercise and diet.   PAST SURGICAL HISTORY: Past Surgical History  Procedure Laterality Date  . Cardiolite study  11/25/2003    NORMAL  . Bone marrow transplant  2011    for MM  . Tonsillectomy      FAMILY HISTORY Family History  Problem Relation Age of Onset  . Throat cancer Mother   . Hypertension Father   . Stroke Father   . Asthma Father   . Diabetes Father   . Pancreatic cancer Brother   [name] father died at the age of 82 following a  stroke.  [name] mother died with cancer of the throat at the age of 72.   She had a history of depression and was a smoker.  Not clear how much alcohol she drank, according to the patient.  The patient had two brothers.  One died from the age of 50 from a "kidney problem."  The second one died at the age of 31 from pancreatic cancer. (This may have been duodenal cancer. 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  His wife of 37+ years was Andre [name], but they are now divorced. In 2016 he remarried, his current wife's name is Andre [name].  The patient has two daughters:  Andre [name], who works in [place], who is expecting her second child April 2016. .  They both live here in [place]. 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  The patient is very close to his daughters.  Petit first daughter, Andre [name], born 03/06/2013, apparently has Weber/Osler/Rendu. The patient is not a church attender.  He does derive a great deal of support from friends at the "[employer]" which he attends very regularly, and also participates in Al-Anon even though actually there is no alcohol or drug problem in the family.  He participates in this group and he gets quite a bit of support from it.     ADVANCED DIRECTIVES: In place  HEALTH MAINTENANCE: (updated 07/16/2013) Social History  Substance Use Topics  . Smoking status: Former Smoker -- 3 years    Quit date: 03/27/1969  . Smokeless tobacco: Never Used  . Alcohol Use: No     Colonoscopy:  PSA:  Sept 2014, normal/Dr. [name]  Lipid panel: Sept 2014/Dr. [name]   Allergies  Allergen Reactions  . Atorvastatin Other (See Comments)  . Rosuvastatin     Other reaction(s): Liver Disorder  . Crestor [Rosuvastatin Calcium]     ADVERSE EFFECTS ON LIVER  .  Lipitor [Atorvastatin Calcium]     Current Outpatient Prescriptions  Medication Sig Dispense Refill  . Bisacodyl (DULCOLAX PO) Take 1 tablet by mouth daily as needed (for constipation). Reported on 11/05/2015    . cetirizine (ZYRTEC) 10 MG tablet Take 10 mg by mouth daily.    . cholecalciferol (VITAMIN D) 1000 UNITS tablet Take 1 tablet (1,000 Units total) by mouth daily. 100 tablet 3  . dexamethasone (DECADRON) 6 MG tablet Take 3 tablets by mouth with food as directed 24 tablet 4  . escitalopram (LEXAPRO) 10 MG tablet Take 1 tablet (10 mg total) by mouth daily. 30 tablet 5  . ketoconazole (NIZORAL) 2 % cream Apply 1 application topically daily. 15 g 0  . levothyroxine (SYNTHROID,  LEVOTHROID) 150 MCG tablet Take 1 tablet by mouth  daily 90 tablet 3  . pantoprazole (PROTONIX) 40 MG tablet Take 1 tablet (40 mg total) by mouth daily. 30 tablet 0  . polyethylene glycol (MIRALAX / GLYCOLAX) packet Take 17 g by mouth daily.    . pomalidomide (POMALYST) 4 MG capsule Take 1 capsule by mouth with water daily on days 1-14. Repeat every 21 days. 14 capsule 4  . senna (SENOKOT) 8.6 MG tablet Take 1 tablet by mouth 2 (two) times daily.    . valACYclovir (VALTREX) 1000 MG tablet Take 1 tablet (1,000 mg total) by mouth daily. 60 tablet 6  . zolpidem (AMBIEN) 5 MG tablet Take 1-2 tablets (5-10 mg total) by mouth at bedtime as needed for sleep. 30 tablet 1  . [DISCONTINUED] fexofenadine (ALLEGRA) 60 MG tablet Take 180 mg by mouth daily.       No current facility-administered medications for this visit.    OBJECTIVE: Middle-aged white ma iIn no acute distress Filed Vitals:   11/16/15 0814  BP: 138/79  Pulse: 71  Temp: 97.8 F (36.6 C)  Resp: 18   Body mass index is 26.85 kg/(m^2).  Filed Weights   11/16/15 0814  Weight: 187 lb 1.6 oz (84.868 kg)   Sclerae unicteric, pupils round and equal Oropharynx clear and moist-- no thrush or other lesions No cervical or supraclavicular adenopathy Lungs no  rales or rhonchi Heart regular rate and rhythm Abd soft, nontender, positive bowel sounds MSK no focal spinal tenderness, no upper extremity lymphedema Neuro: nonfocal, well oriented, appropriate affect     LAB RESULTS:  Lab Results  Component Value Date   WBC 2.0* 11/16/2015   NEUTROABS 1.2* 11/16/2015   HGB 11.2* 11/16/2015   HCT 32.9* 11/16/2015   MCV 95.6 11/16/2015   PLT 63* 11/16/2015      Chemistry      Component Value Date/Time   NA 136 11/09/2015 0449   NA 133* 11/02/2015 0920   K 4.3 11/09/2015 0449   K 4.3 11/02/2015 0920   CL 109 11/09/2015 0449   CL 107 03/10/2013 1352   CO2 22 11/09/2015 0449   CO2 19* 11/02/2015 0920   BUN 11 11/09/2015 0449   BUN 24.4 11/02/2015 0920   CREATININE 1.00 11/09/2015 0449   CREATININE 1.3 11/02/2015 0920      Component Value Date/Time   CALCIUM 8.5* 11/09/2015 0449   CALCIUM 9.4 11/02/2015 0920   ALKPHOS 69 11/09/2015 0449   ALKPHOS 81 11/02/2015 0920   AST 20 11/09/2015 0449   AST 25 11/02/2015 0920   ALT 27 11/09/2015 0449   ALT 32 11/02/2015 0920   BILITOT 0.3 11/09/2015 0449   BILITOT 0.34 11/02/2015 0920     Repeat/lambda and M spike drawn today and pending  STUDIES: Ct Abdomen Pelvis W Contrast  11/07/2015  CLINICAL DATA:  Patient with multiple myeloma diagnosed October, 2012. Last chemotherapy treatment 11/05/2015. Lower abdominal pain and cramping today. Initial encounter. EXAM: CT ABDOMEN AND PELVIS WITH CONTRAST TECHNIQUE: Multidetector CT imaging of the abdomen and pelvis was performed using the standard protocol following bolus administration of intravenous contrast. CONTRAST:  25 mL OMNIPAQUE IOHEXOL 300 MG/ML SOLN, 80 mL OMNIPAQUE IOHEXOL 300 MG/ML SOLN COMPARISON:  PET CT scan 04/01/2013.  Osseous survey 09/06/2015. FINDINGS: The lung bases are clear. No pleural or pericardial effusion. Heart size is normal. Calcific coronary artery disease is seen. Small hiatal hernia is noted. A few small gallstones are  identified there  is no CT evidence of cholecystitis. The liver, spleen, adrenal glands, kidneys and pancreas appear normal. Small bowel loops are dilated up to approximately 3.3 cm. Fecalized contents of small bowel are seen in the mid and right upper quadrant of the abdomen. Transition point appears to the in the right upper quadrant where an angulated loop of bowel is identified. Distal small bowel loops are completely decompressed. There is a small volume of free pelvic fluid. No pneumatosis, free intraperitoneal air or portal venous gas is identified. The patient has a right inguinal hernia containing a loop of small bowel but there is no transition point in this location. The colon demonstrates scattered diverticular disease without diverticulitis. There is a large volume of stool in the ascending and transverse colon. The appendix appears normal. No lymphadenopathy is identified. Scattered aorto iliac atherosclerosis without aneurysm is seen. Bones demonstrate a mild, anterior superior endplate compression fracture of T12. Scattered punctate lucent lesions are identified throughout the spine and likely in the pelvis and hips consistent with the patient's history of multiple myeloma. IMPRESSION: The study is positive for small bowel obstruction with a transition poorly likely in the right upper quadrant of the abdomen where an angulated loop of bowel is identified. Right inguinal hernia contains a loop of small bowel but no associated complication such as obstruction. Prominent stool burden ascending and transverse colon. Diverticulosis without diverticulitis. Aortoiliac atherosclerosis. Gallstones without cholecystitis. Punctate lucent lesions in the bones are consistent with history of multiple myeloma. Mild superior endplate compression fracture of T12 is seen on the comparison osseous survey. Calcific coronary artery disease. Electronically Signed   By: Inge Rise M.D.   On: 11/07/2015 17:14   Dg  Abd 2 Views  11/08/2015  CLINICAL DATA:  Follow-up small bowel obstruction, abdominal cramping all over EXAM: ABDOMEN - 2 VIEW COMPARISON:  CT 11/07/2015 FINDINGS: NG tube is in the stomach. Large volume stool within the colon. Oral contrast material within the right colon. No current visible dilated small bowel loops. No free air organomegaly. IMPRESSION: No current visible dilated small bowel loops. Electronically Signed   By: Rolm Baptise M.D.   On: 11/08/2015 09:52    ASSESSMENT: 65 y.o.  [place] man with a history of multiple myeloma diagnosed February of 2012, with an initial M-spike of 2.6 g/dL, IFE showing an IgG lambda paraprotein and free lambda light chains in the urine. Cytogenetics showed trisomy 61. Bone marrow biopsy showed a 22% plasmacytosis. Treated with   (1) bortezomib (subcutaneously), lenalidomide, and dexamethasone, with repeat bone marrow biopsy May of 2012 showing 10% plasmacytosis   (2) high-dose chemotherapy with BCNU and melphalan at [facility], followed by stem cell rescue July of 2012   (3) on zoledronic acid started December of 2012, initially monthly, currently given every 3 months, most recent dose 02/17/2014  (4) low-dose lenalidomide resumed April 2013, interrupted several times since.  Resumed again on 02/19/2013, and a dose of 5 mg daily, 21 days on and 7 days off, later further reduced to 7 days on, 7 days off  (5) CNS symptoms and abnormal brain MRI extensively evaluated by neurology with no definitive diagnosis established; improving   (6) rising M spike noted June 2014 but did not meet criteria for carfilzomib study  (7) on lenalidomide 25 mg daily, 14 days on, 7 days off, starting 04/18/2013, interrupted December 2014 because of rash;   (a) resumed January 2015 at 10 mg/ day at 21 days on/ 7 days off  (Dimitrious) starting 08/18/2014  decreasedto 10 mg/ day 14 days on, 7 days off because of cytopenias  (c) as of February 2016 he has been on 5 mg daily 7 days on 7 days  off  (d) lenalidomide discontinued December 2016 with evidence of disease progression  (8) transient global amnesia 05/29/2015, resolved without intervention  (9) starting PVD 10/25/2015 w ASA 325 thromboprophylaxis, valacyclovir prophylaxis  (a) pomalidomide 4 mg/d days 1-14  (Enis) bortezomib sQ days 2,5,9,,12 of each 21 day cycle  (c) dexamethasome 20 mg two days a week   PLAN:  I think Andre [name]'s apparent small bowel obstruction, which was very transient, may have really represented a significant ileus on the abnormal liver mild. We will have to watch this. He is encouraged to use the MiraLAX regularly, and also his plan to eat more fiber is a good one. He will let us know if he becomes backed up.  His platelets are acceptable, being over 50,000. His white cells are close to neutropenia, and we will be watching this closely each dose of bortezomib. He may need some Neulasta support at some point to keep his treatments in course.  I have gone ahead and made him a return appointment here on March 21, which will be the beginning of his third cycle. He will receive his next zolendronate dose on March 24. He will start cycle 4 April 11 and around that time he will also be reevaluated at [facility] under Dr. Kendell Bane guidance. We are looking to his transplant sometime in May or June.  He knows to call for any problems that may develop before his next visit here. Chauncey Cruel, MD    11/16/2015

## 2015-11-17 ENCOUNTER — Ambulatory Visit (INDEPENDENT_AMBULATORY_CARE_PROVIDER_SITE_OTHER): Payer: Commercial Managed Care - PPO | Admitting: Family

## 2015-11-17 ENCOUNTER — Encounter: Payer: Self-pay | Admitting: Family

## 2015-11-17 VITALS — BP 128/88 | HR 56 | Temp 97.9°F | Resp 14 | Ht 70.0 in | Wt 188.0 lb

## 2015-11-17 DIAGNOSIS — S8002XA Contusion of left knee, initial encounter: Secondary | ICD-10-CM

## 2015-11-17 DIAGNOSIS — K5669 Other intestinal obstruction: Secondary | ICD-10-CM | POA: Diagnosis not present

## 2015-11-17 DIAGNOSIS — C9000 Multiple myeloma not having achieved remission: Secondary | ICD-10-CM | POA: Diagnosis not present

## 2015-11-17 DIAGNOSIS — K56609 Unspecified intestinal obstruction, unspecified as to partial versus complete obstruction: Secondary | ICD-10-CM

## 2015-11-17 DIAGNOSIS — S8000XA Contusion of unspecified knee, initial encounter: Secondary | ICD-10-CM | POA: Insufficient documentation

## 2015-11-17 LAB — KAPPA/LAMBDA LIGHT CHAINS
Ig Kappa Free Light Chain: 34.04 mg/L — ABNORMAL HIGH (ref 3.30–19.40)
Ig Lambda Free Light Chain: 38.49 mg/L — ABNORMAL HIGH (ref 5.71–26.30)
Kappa/Lambda FluidC Ratio: 0.88 (ref 0.26–1.65)

## 2015-11-17 NOTE — Assessment & Plan Note (Signed)
Symptoms and exam consistent with contusion. Continue conservative treatment with ice/heat and over-the-counter medications as needed for symptom relief. Stretching multiple times throughout the day. Follow-up if symptoms worsen or do not improve.

## 2015-11-17 NOTE — Progress Notes (Signed)
Pre visit review using our clinic review tool, if applicable. No additional management support is needed unless otherwise documented below in the visit note. 

## 2015-11-17 NOTE — Progress Notes (Signed)
 Subjective:    Patient ID:  Andre [name], male    DOB: 04/28/1951, 65 y.o.   MRN: [id number]  Chief Complaint  Patient presents with  . Hospitalization Follow-up    feeling better since being out of the hospital, no concerns    HPI:   Andre Bergeron is a 65 y.o. male who  has a past medical history of Hypertension; Hyperlipidemia; Duodenal ulcer; Allergy; Thyroid disease; GERD (gastroesophageal reflux disease); Depression; Anemia; Hypothyroidism; Asthma; Arthritis; Double vision; and Multiple myeloma (07/04/2011). and presents today for a hospitalization follow up.  Recently evaluated in the emergency department and admitted to the hospital with complaints of sudden onset upper abdominal pain. Describes the pain as cramping and a severity of 3 out of 10. Associated symptoms included constipation for which she attempted laxative use. Physical exam showed generalized upper abdominal pain. He is currently being treated for multiple myeloma. Lives revealed a lipase of 72 consistent with possible early pancreatitis and a normal UA. A CT scan was ordered to evaluate the gallbladder. CT significant for small bowel obstruction with transition point in right upper quadrant and prominent stool burden in the transverse and descending colon with no impaction. A nasogastric tube was placed and surgery was consulted. Small bowel obstruction was treated medically and KUB the following morning was normal. He is recommended to stay on a low residual diet for 1 week and then slowly advance as tolerated. Recommended continuation of aggressive constipation protocol. He'll continue chemotherapy for his multiple myeloma. Hospitalist follow-up recommendations include CBC, CMP and advancing diet slowly from low residual and gradually introducing higher fiber foods. All hospital records, labs and procedures were reviewed in detail.  Since leaving the hospital he reports that he is feeling better with no concerns. Has  restarted taking his medications as prescribed. Currently using Miralax and Senna which does help with his symptoms. Reports having a bowel movement just about daily. No fevers or abdominal pain. He is back to his baseline since being in the hospital. Does have some difficulties sleeping which is managed with Ambien. Notes some discomfort in his left knee from prior to hospitalization   Allergies  Allergen Reactions  . Atorvastatin Other (See Comments)  . Rosuvastatin     Other reaction(s): Liver Disorder  . Crestor [Rosuvastatin Calcium]     ADVERSE EFFECTS ON LIVER  . Lipitor [Atorvastatin Calcium]      Current Outpatient Prescriptions on File Prior to Visit  Medication Sig Dispense Refill  . Bisacodyl (DULCOLAX PO) Take 1 tablet by mouth daily as needed (for constipation). Reported on 11/05/2015    . cetirizine (ZYRTEC) 10 MG tablet Take 10 mg by mouth daily.    . cholecalciferol (VITAMIN D) 1000 UNITS tablet Take 1 tablet (1,000 Units total) by mouth daily. 100 tablet 3  . dexamethasone (DECADRON) 6 MG tablet Take 3 tablets by mouth with food as directed 24 tablet 4  . escitalopram (LEXAPRO) 10 MG tablet Take 1 tablet (10 mg total) by mouth daily. 30 tablet 5  . ketoconazole (NIZORAL) 2 % cream Apply 1 application topically daily. 15 g 0  . levothyroxine (SYNTHROID, LEVOTHROID) 150 MCG tablet Take 1 tablet by mouth  daily 90 tablet 3  . pantoprazole (PROTONIX) 40 MG tablet Take 1 tablet (40 mg total) by mouth daily. 30 tablet 0  . polyethylene glycol (MIRALAX / GLYCOLAX) packet Take 17 g by mouth daily.    . pomalidomide (POMALYST) 4 MG capsule Take 1 capsule by mouth with   water daily on days 1-14. Repeat every 21 days. 14 capsule 4  . senna (SENOKOT) 8.6 MG tablet Take 1 tablet by mouth 2 (two) times daily.    . valACYclovir (VALTREX) 1000 MG tablet Take 1 tablet (1,000 mg total) by mouth daily. 60 tablet 6  . zolpidem (AMBIEN) 5 MG tablet Take 1-2 tablets (5-10 mg total) by mouth at  bedtime as needed for sleep. 30 tablet 1  . [DISCONTINUED] fexofenadine (ALLEGRA) 60 MG tablet Take 180 mg by mouth daily.       No current facility-administered medications on file prior to visit.     Past Surgical History  Procedure Laterality Date  . Cardiolite study  11/25/2003    NORMAL  . Bone marrow transplant  2011    for MM  . Tonsillectomy      Past Medical History  Diagnosis Date  . Hypertension   . Hyperlipidemia   . Duodenal ulcer   . Allergy   . Thyroid disease   . GERD (gastroesophageal reflux disease)   . Depression   . Anemia   . Hypothyroidism   . Asthma     no treatment x 20 years  . Arthritis   . Double vision     occurs at times   . Multiple myeloma 07/04/2011     Review of Systems  Constitutional: Negative for fever and chills.  Cardiovascular: Negative for chest pain, palpitations and leg swelling.  Gastrointestinal: Negative for nausea, vomiting, abdominal pain, constipation and blood in stool.  Musculoskeletal:       Positive for left knee pain.      Objective:    BP 128/88 mmHg  Pulse 56  Temp(Src) 97.9 F (36.6 C) (Oral)  Resp 14  Ht 5' 10" (1.778 m)  Wt 188 lb (85.276 kg)  BMI 26.98 kg/m2  SpO2 97% Nursing note and vital signs reviewed.  Physical Exam  Constitutional: He is oriented to person, place, and time. He appears well-developed and well-nourished. No distress.  Cardiovascular: Normal rate, regular rhythm, normal heart sounds and intact distal pulses.   Pulmonary/Chest: Effort normal and breath sounds normal.  Abdominal: Soft. Bowel sounds are normal. He exhibits no distension and no mass. There is no tenderness. There is no rebound and no guarding.  Musculoskeletal:  Left knee - no obvious deformity or edema noted. Mild discoloration. Tenderness elicited along the extensor mechanism superior to patella. No other tenderness able to be elicited. Range of motion within normal limits. Strength is 5+. Ligamentous and meniscal  testing are negative. Distal pulses and sensation are intact and appropriate.  Neurological: He is alert and oriented to person, place, and time.  Skin: Skin is warm and dry.  Psychiatric: He has a normal mood and affect. His behavior is normal. Judgment and thought content normal.       Assessment & Plan:   Problem List Items Addressed This Visit      Digestive   SBO (small bowel obstruction) (HCC)    Symptoms of constipation and small bowel obstruction appear resolved with current treatments. Continue current MiraLAX and senna to prevent constipation. No further treatment necessary at this time. Follow-up if symptoms return or constipation worsens.        Other   Contusion, knee - Primary    Symptoms and exam consistent with contusion. Continue conservative treatment with ice/heat and over-the-counter medications as needed for symptom relief. Stretching multiple times throughout the day. Follow-up if symptoms worsen or do not improve.

## 2015-11-17 NOTE — Patient Instructions (Signed)
Thank you for choosing [facility].  Summary/Instructions:  Please continue to take your medication   If your symptoms worsen or fail to improve, please contact our office for further instruction, or in case of emergency go directly to the emergency room at the closest medical facility.

## 2015-11-17 NOTE — Assessment & Plan Note (Addendum)
Symptoms of constipation and small bowel obstruction appear resolved with current treatments. He continues to remain at high risk secondary to his current chemotherapy treatments. Continue current MiraLAX and senna to prevent constipation. No further treatment necessary at this time. Follow-up if symptoms return or constipation worsens.

## 2015-11-18 LAB — PROTEIN ELECTROPHORESIS, SERUM
A/G Ratio: 1.1 (ref 0.7–1.7)
ALPHA 1: 0.2 g/dL (ref 0.0–0.4)
ALPHA 2: 0.8 g/dL (ref 0.4–1.0)
Albumin: 3.3 g/dL (ref 2.9–4.4)
BETA: 1.1 g/dL (ref 0.7–1.3)
GLOBULIN, TOTAL: 3.1 g/dL (ref 2.2–3.9)
Gamma Globulin: 1 g/dL (ref 0.4–1.8)
M-SPIKE, %: 0.6 g/dL — AB
Total Protein: 6.4 g/dL (ref 6.0–8.5)

## 2015-11-19 ENCOUNTER — Other Ambulatory Visit: Payer: Commercial Managed Care - PPO

## 2015-11-19 ENCOUNTER — Ambulatory Visit (HOSPITAL_BASED_OUTPATIENT_CLINIC_OR_DEPARTMENT_OTHER): Payer: Commercial Managed Care - PPO

## 2015-11-19 VITALS — BP 136/82 | HR 60 | Temp 97.1°F

## 2015-11-19 DIAGNOSIS — C9002 Multiple myeloma in relapse: Secondary | ICD-10-CM

## 2015-11-19 DIAGNOSIS — Z5112 Encounter for antineoplastic immunotherapy: Secondary | ICD-10-CM

## 2015-11-19 MED ORDER — PROCHLORPERAZINE MALEATE 10 MG PO TABS
10.0000 mg | ORAL_TABLET | Freq: Once | ORAL | Status: AC
  Administered 2015-11-19: 10 mg via ORAL

## 2015-11-19 MED ORDER — BORTEZOMIB CHEMO SQ INJECTION 3.5 MG (2.5MG/ML)
1.3000 mg/m2 | Freq: Once | INTRAMUSCULAR | Status: AC
  Administered 2015-11-19: 2.75 mg via SUBCUTANEOUS
  Filled 2015-11-19: qty 2.75

## 2015-11-19 MED ORDER — PROCHLORPERAZINE MALEATE 10 MG PO TABS
ORAL_TABLET | ORAL | Status: AC
  Filled 2015-11-19: qty 1

## 2015-11-19 NOTE — Progress Notes (Signed)
OK to treat today with labs from 11/16/15 - VO given and read back with Dr. [name].

## 2015-11-19 NOTE — Patient Instructions (Signed)
[facility] Discharge Instructions for Patients Receiving Chemotherapy  Today you received the following chemotherapy agents:  Velcade  To help prevent nausea and vomiting after your treatment, we encourage you to take your nausea medication as prescribed.   If you develop nausea and vomiting that is not controlled by your nausea medication, call the clinic.   BELOW ARE SYMPTOMS THAT SHOULD BE REPORTED IMMEDIATELY:  *FEVER GREATER THAN 100.5 F  *CHILLS WITH OR WITHOUT FEVER  NAUSEA AND VOMITING THAT IS NOT CONTROLLED WITH YOUR NAUSEA MEDICATION  *UNUSUAL SHORTNESS OF BREATH  *UNUSUAL BRUISING OR BLEEDING  TENDERNESS IN MOUTH AND THROAT WITH OR WITHOUT PRESENCE OF ULCERS  *URINARY PROBLEMS  *BOWEL PROBLEMS  UNUSUAL RASH Items with * indicate a potential emergency and should be followed up as soon as possible.  Feel free to call the clinic you have any questions or concerns. The clinic phone number is [phone].  Please show the CHEMO ALERT CARD at check-in to the Emergency Department and triage nurse.   

## 2015-11-22 NOTE — Telephone Encounter (Signed)
..  Oral Chemotherapy Follow-Up Form    Called patient today to follow up regarding patient's oral chemotherapy medication: 10/25/15  Attempted to reach patient at home. Left VM requesting patient call if he is experiencing adverse effects from his Pomalyst or has any questions about his treatment. Will attempt to call patient again in 2 weeks.

## 2015-11-23 ENCOUNTER — Other Ambulatory Visit (HOSPITAL_BASED_OUTPATIENT_CLINIC_OR_DEPARTMENT_OTHER): Payer: Commercial Managed Care - PPO

## 2015-11-23 ENCOUNTER — Telehealth: Payer: Self-pay | Admitting: *Deleted

## 2015-11-23 ENCOUNTER — Ambulatory Visit (HOSPITAL_BASED_OUTPATIENT_CLINIC_OR_DEPARTMENT_OTHER): Payer: Commercial Managed Care - PPO

## 2015-11-23 ENCOUNTER — Other Ambulatory Visit: Payer: Self-pay | Admitting: *Deleted

## 2015-11-23 VITALS — BP 109/59 | HR 65 | Temp 97.9°F | Resp 18

## 2015-11-23 DIAGNOSIS — C9000 Multiple myeloma not having achieved remission: Secondary | ICD-10-CM

## 2015-11-23 DIAGNOSIS — Z5112 Encounter for antineoplastic immunotherapy: Secondary | ICD-10-CM | POA: Diagnosis not present

## 2015-11-23 DIAGNOSIS — C9002 Multiple myeloma in relapse: Secondary | ICD-10-CM

## 2015-11-23 LAB — CBC WITH DIFFERENTIAL/PLATELET
BASO%: 0.3 % (ref 0.0–2.0)
Basophils Absolute: 0 10*3/uL (ref 0.0–0.1)
EOS%: 3.8 % (ref 0.0–7.0)
Eosinophils Absolute: 0.1 10*3/uL (ref 0.0–0.5)
HCT: 33.9 % — ABNORMAL LOW (ref 38.4–49.9)
HGB: 11.4 g/dL — ABNORMAL LOW (ref 13.0–17.1)
LYMPH%: 18.1 % (ref 14.0–49.0)
MCH: 32.5 pg (ref 27.2–33.4)
MCHC: 33.6 g/dL (ref 32.0–36.0)
MCV: 96.6 fL (ref 79.3–98.0)
MONO#: 0.7 10*3/uL (ref 0.1–0.9)
MONO%: 18.7 % — AB (ref 0.0–14.0)
NEUT%: 59.1 % (ref 39.0–75.0)
NEUTROS ABS: 2.2 10*3/uL (ref 1.5–6.5)
Platelets: 47 10*3/uL — ABNORMAL LOW (ref 140–400)
RBC: 3.51 10*6/uL — AB (ref 4.20–5.82)
RDW: 18.1 % — ABNORMAL HIGH (ref 11.0–14.6)
WBC: 3.6 10*3/uL — AB (ref 4.0–10.3)
lymph#: 0.7 10*3/uL — ABNORMAL LOW (ref 0.9–3.3)
nRBC: 1 % — ABNORMAL HIGH (ref 0–0)

## 2015-11-23 MED ORDER — METOCLOPRAMIDE HCL 5 MG PO TABS: 5.0000 mg | ORAL_TABLET | Freq: Four times a day (QID) | ORAL | Status: DC | PRN

## 2015-11-23 MED ORDER — PROCHLORPERAZINE MALEATE 10 MG PO TABS
ORAL_TABLET | ORAL | Status: AC
  Filled 2015-11-23: qty 1

## 2015-11-23 MED ORDER — PROCHLORPERAZINE MALEATE 10 MG PO TABS
10.0000 mg | ORAL_TABLET | Freq: Once | ORAL | Status: AC
  Administered 2015-11-23: 10 mg via ORAL

## 2015-11-23 MED ORDER — BORTEZOMIB CHEMO SQ INJECTION 3.5 MG (2.5MG/ML)
1.3000 mg/m2 | Freq: Once | INTRAMUSCULAR | Status: AC
  Administered 2015-11-23: 2.75 mg via SUBCUTANEOUS
  Filled 2015-11-23: qty 2.75

## 2015-11-23 NOTE — Telephone Encounter (Signed)
This RN was contacted by RN in treatment room per pt's platelet level today is 47,000 - pt scheduled for velcade injection.  Pt has also been constipated with BM this AM.   Per MD ok to treat today.  Pt is to institute miralax BID.  Informed RN in treatment room.

## 2015-11-23 NOTE — Progress Notes (Signed)
Patient comes in today for velcade.  He has had severe constipation the last two days.  He took miralax, senna, ducolax as warm apple juice, prunes.  He just got relief this morning.  He has felt pretty miserable and has also had some nausea with this.  He would like a script for Compazine. Discussed with Marlon Pel RN -  She will call in compazine to his pharmacy.  Also he is to use miraxlax BID for the next few days and then let her know Thursday or Friday how he is doing. Will verify with Dr. [name] [id number] today with platelets of 47K.   VO given and read back Val DoddRN/Dr. Magrinat  OK to treat today with platelets of 47K.

## 2015-11-23 NOTE — Patient Instructions (Signed)
[facility] Discharge Instructions for Patients Receiving Chemotherapy  Today you received the following chemotherapy agents Velcade  To help prevent nausea and vomiting after your treatment, we encourage you to take your nausea medication    If you develop nausea and vomiting that is not controlled by your nausea medication, call the clinic.   BELOW ARE SYMPTOMS THAT SHOULD BE REPORTED IMMEDIATELY:  *FEVER GREATER THAN 100.5 F  *CHILLS WITH OR WITHOUT FEVER  NAUSEA AND VOMITING THAT IS NOT CONTROLLED WITH YOUR NAUSEA MEDICATION  *UNUSUAL SHORTNESS OF BREATH  *UNUSUAL BRUISING OR BLEEDING  TENDERNESS IN MOUTH AND THROAT WITH OR WITHOUT PRESENCE OF ULCERS  *URINARY PROBLEMS  *BOWEL PROBLEMS  UNUSUAL RASH Items with * indicate a potential emergency and should be followed up as soon as possible.  Feel free to call the clinic you have any questions or concerns. The clinic phone number is [phone].  Please show the CHEMO ALERT CARD at check-in to the Emergency Department and triage nurse.   

## 2015-11-26 ENCOUNTER — Telehealth: Payer: Self-pay | Admitting: Oncology

## 2015-11-26 ENCOUNTER — Ambulatory Visit (HOSPITAL_BASED_OUTPATIENT_CLINIC_OR_DEPARTMENT_OTHER): Payer: Commercial Managed Care - PPO | Admitting: Nurse Practitioner

## 2015-11-26 ENCOUNTER — Ambulatory Visit (HOSPITAL_BASED_OUTPATIENT_CLINIC_OR_DEPARTMENT_OTHER): Payer: Commercial Managed Care - PPO

## 2015-11-26 ENCOUNTER — Other Ambulatory Visit (HOSPITAL_BASED_OUTPATIENT_CLINIC_OR_DEPARTMENT_OTHER): Payer: Commercial Managed Care - PPO

## 2015-11-26 ENCOUNTER — Encounter: Payer: Self-pay | Admitting: Nurse Practitioner

## 2015-11-26 VITALS — BP 121/73 | HR 93 | Temp 97.4°F | Resp 18 | Wt 190.1 lb

## 2015-11-26 DIAGNOSIS — C9000 Multiple myeloma not having achieved remission: Secondary | ICD-10-CM

## 2015-11-26 DIAGNOSIS — D696 Thrombocytopenia, unspecified: Secondary | ICD-10-CM

## 2015-11-26 DIAGNOSIS — C9002 Multiple myeloma in relapse: Secondary | ICD-10-CM

## 2015-11-26 DIAGNOSIS — Z5112 Encounter for antineoplastic immunotherapy: Secondary | ICD-10-CM | POA: Diagnosis not present

## 2015-11-26 DIAGNOSIS — K5900 Constipation, unspecified: Secondary | ICD-10-CM

## 2015-11-26 LAB — CBC WITH DIFFERENTIAL/PLATELET
BASO%: 0.2 % (ref 0.0–2.0)
Basophils Absolute: 0 10*3/uL (ref 0.0–0.1)
EOS%: 0.1 % (ref 0.0–7.0)
Eosinophils Absolute: 0 10*3/uL (ref 0.0–0.5)
HCT: 34.2 % — ABNORMAL LOW (ref 38.4–49.9)
HEMOGLOBIN: 11.3 g/dL — AB (ref 13.0–17.1)
LYMPH%: 11.8 % — ABNORMAL LOW (ref 14.0–49.0)
MCH: 32.4 pg (ref 27.2–33.4)
MCHC: 33 g/dL (ref 32.0–36.0)
MCV: 98 fL (ref 79.3–98.0)
MONO#: 0.2 10*3/uL (ref 0.1–0.9)
MONO%: 4.5 % (ref 0.0–14.0)
NEUT%: 83.4 % — ABNORMAL HIGH (ref 39.0–75.0)
NEUTROS ABS: 3.3 10*3/uL (ref 1.5–6.5)
Platelets: 47 10*3/uL — ABNORMAL LOW (ref 140–400)
RBC: 3.49 10*6/uL — ABNORMAL LOW (ref 4.20–5.82)
RDW: 18.9 % — ABNORMAL HIGH (ref 11.0–14.6)
WBC: 4 10*3/uL (ref 4.0–10.3)
lymph#: 0.5 10*3/uL — ABNORMAL LOW (ref 0.9–3.3)

## 2015-11-26 MED ORDER — BORTEZOMIB CHEMO SQ INJECTION 3.5 MG (2.5MG/ML)
1.3000 mg/m2 | Freq: Once | INTRAMUSCULAR | Status: AC
  Administered 2015-11-26: 2.75 mg via SUBCUTANEOUS
  Filled 2015-11-26: qty 2.75

## 2015-11-26 MED ORDER — PROCHLORPERAZINE MALEATE 10 MG PO TABS
10.0000 mg | ORAL_TABLET | Freq: Once | ORAL | Status: AC
  Administered 2015-11-26: 10 mg via ORAL

## 2015-11-26 MED ORDER — PROCHLORPERAZINE MALEATE 10 MG PO TABS
ORAL_TABLET | ORAL | Status: AC
Start: 2015-11-26 — End: 2015-11-26
  Filled 2015-11-26: qty 1

## 2015-11-26 NOTE — Telephone Encounter (Signed)
gave and printed appt sched and avs for pt for march and April

## 2015-11-26 NOTE — Progress Notes (Signed)
ID: Andre [name]   DOB: 1951-01-07  MR#: [id number]  OFB#:[id number]   PCP: Andre [name] SU: OTHER MD: Andre [name], Andre [name], [address] [place] [phone]), Andre [name]  CHIEF COMPLAINT: Multiple myeloma  CURRENT TREATMENT: Bortezomib, pomalidomide, dexamethasone  HISTORY OF PRESENT ILLNESS: From the original intake nodes:  The patient was worked up for peptic ulcer disease in August of 2011, with significant bleeding and anemia.  The patient was Helicobacter pylori negative.  He received some epinephrine when he had his EGD and then started on Protonix.  [name] anemia slowly resolved so that by September, his hemoglobin was up to 10.9 and by earlier this month, his hemoglobin was up to 12.5.    As part of his general workup, he was found to have a slightly elevated globulin fraction.  In September, his total protein was 8.3 with an albumin of 3.8.  In January, the total protein was 8.4 and albumin 3.6.  With persistence of this slight abnormality, Dr. Olevia [name] obtained serum immunofixation and SPEP.  The SPTP showed an M-spike of 2.67 grams.  A total IgG was 4,190.  Total IgA low at 28.  Total IgM low at 28 also.  The immunofixation showed a monoclonal IgG lambda paraprotein.  There were also monoclonal free lambda light chains present. With this information, the patient was referred for further evaluation.   A diagnosis of myeloma was confirmed by bone marrow biopsy and subsequebnt treatment is as detailed below  INTERVAL HISTORY: Andre [name] returns today for follow-up of his relapsed multiple myeloma, accompanied by his significant other. Today is day 11, cycle 2 of bortezomib which he receives on days 1, 4, 8 and 11 every 21 days. He is also on pomalidomide and has 3 days left in this cycle. Dexamethasone 57m is taken twice weekly.   REVIEW OF SYSTEMS: Andre Reichmannstarted taking miralax twice daily and finally had a soft formed bowel movement  yesterday. Nothing today, but he does feel less bloated. His appetite is up and down depending on the day. Compazine manages his nausea well. He complains of fatigue and generalized weakness. His only pain is chronic in the low back. He sleeps well. He denies fevers or chills. He has no mouth sores, rashes, or neuropathy symptoms. A detailed review of systems sis otherwise stable.  PAST MEDICAL HISTORY: Past Medical History  Diagnosis Date  . Hypertension   . Hyperlipidemia   . Duodenal ulcer   . Allergy   . Thyroid disease   . GERD (gastroesophageal reflux disease)   . Depression   . Anemia   . Hypothyroidism   . Asthma     no treatment x 20 years  . Arthritis   . Double vision     occurs at times   . Multiple myeloma 07/04/2011  Significant for peptic ulcer disease as noted above.  History of hyperlipidemia.  History of anxiety and depression.  History of GERD which is significantly improved with weight loss and history of reactive airway disease, possibly secondary to the GERD which also improved with weight loss.  There was a history of obesity, now much improved secondary to exercise and diet.   PAST SURGICAL HISTORY: Past Surgical History  Procedure Laterality Date  . Cardiolite study  11/25/2003    NORMAL  . Bone marrow transplant  2011    for MM  . Tonsillectomy      FAMILY HISTORY Family History  Problem Relation Age of Onset  . Throat  cancer Mother   . Hypertension Father   . Stroke Father   . Asthma Father   . Diabetes Father   . Pancreatic cancer Brother   [name] father died at the age of 28 following a stroke.  [name] mother died with cancer of the throat at the age of 59.   She had a history of depression and was a smoker.  Not clear how much alcohol she drank, according to the patient.  The patient had two brothers.  One died from the age of 37 from a "kidney problem."  The second one died at the age of 33 from pancreatic cancer. (This may have been  duodenal cancer. 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  His wife of 37+ years was Andre [name], but they are now divorced. In [name] he remarried, his current wife's name is Andre [name].  The patient has two daughters:  Andre [name], who works in [employer], who is expecting her second child Andre [name]. .  They both live here in [place]. 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  The patient is very close to his daughters.  Laura first daughter, Andre [name], born 03/06/2013, apparently has Weber/Osler/Rendu. The patient is not a church attender.  He does derive a great deal of support from friends at the "[employer]" which he attends very regularly, and also participates in Al-Anon even though actually there is no alcohol or drug problem in the family.  He participates in this group and he gets quite a bit of support from it.     ADVANCED DIRECTIVES: In place  HEALTH MAINTENANCE: (updated 07/16/2013) Social History  Substance Use Topics  . Smoking status: Former Smoker -- 3 years    Quit date: 03/27/1969  . Smokeless tobacco: Never Used  . Alcohol Use: No     Colonoscopy:  PSA:  Sept 2014, normal/Dr. [name]  Lipid panel: Sept 2014/Dr. [name]   Allergies  Allergen Reactions  . Atorvastatin Other (See Comments)  . Rosuvastatin     Other reaction(s): Liver Disorder  . Crestor [Rosuvastatin Calcium]     ADVERSE EFFECTS ON LIVER  . Lipitor [Atorvastatin Calcium]     Current Outpatient Prescriptions  Medication Sig Dispense Refill  . Bisacodyl (DULCOLAX PO) Take 1 tablet by mouth daily as needed (for constipation). Reported on 11/05/2015    . cetirizine (ZYRTEC) 10 MG tablet Take 10 mg by mouth daily.    . cholecalciferol (VITAMIN D) 1000 UNITS tablet Take 1 tablet (1,000 Units  total) by mouth daily. 100 tablet 3  . dexamethasone (DECADRON) 6 MG tablet Take 3 tablets by mouth with food as directed 24 tablet 4  . escitalopram (LEXAPRO) 10 MG tablet Take 1 tablet (10 mg total) by mouth daily. 30 tablet 5  . ketoconazole (NIZORAL) 2 % cream Apply 1 application topically daily. 15 g 0  . levothyroxine (SYNTHROID, LEVOTHROID) 150 MCG tablet Take 1 tablet by mouth  daily 90 tablet 3  . metoCLOPramide (REGLAN) 5 MG tablet Take 1 tablet (5 mg total) by mouth every 6 (six) hours as needed for nausea. 90 tablet 1  . pantoprazole (PROTONIX) 40 MG tablet Take 1 tablet (40 mg total) by mouth daily. 30 tablet 0  . polyethylene glycol (MIRALAX / GLYCOLAX) packet Take 17  g by mouth 2 (two) times daily.     . pomalidomide (POMALYST) 4 MG capsule Take 1 capsule by mouth with water daily on days 1-14. Repeat every 21 days. 14 capsule 4  . valACYclovir (VALTREX) 1000 MG tablet Take 1 tablet (1,000 mg total) by mouth daily. 60 tablet 6  . zolpidem (AMBIEN) 5 MG tablet Take 1-2 tablets (5-10 mg total) by mouth at bedtime as needed for sleep. 30 tablet 1  . senna (SENOKOT) 8.6 MG tablet Take 1 tablet by mouth 2 (two) times daily. Reported on 11/26/2015    . [DISCONTINUED] fexofenadine (ALLEGRA) 60 MG tablet Take 180 mg by mouth daily.       No current facility-administered medications for this visit.    OBJECTIVE: Middle-aged white ma iIn no acute distress Filed Vitals:   11/26/15 1027  BP: 121/73  Pulse: 93  Temp: 97.4 F (36.3 C)  Resp: 18   Body mass index is 27.28 kg/(m^2).  Filed Weights   11/26/15 1027  Weight: 190 lb 1.6 oz (86.229 kg)    Skin: warm, dry  HEENT: sclerae anicteric, conjunctivae pink, oropharynx clear. No thrush or mucositis.  Lymph Nodes: No cervical or supraclavicular lymphadenopathy  Lungs: clear to auscultation bilaterally, no rales, wheezes, or rhonci  Heart: regular rate and rhythm  Abdomen: round, soft, non tender, positive bowel sounds   Musculoskeletal: No focal spinal tenderness, no peripheral edema  Neuro: non focal, well oriented, positive affect   LAB RESULTS:  Lab Results  Component Value Date   WBC 4.0 11/26/2015   NEUTROABS 3.3 11/26/2015   HGB 11.3* 11/26/2015   HCT 34.2* 11/26/2015   MCV 98.0 11/26/2015   PLT 47* 11/26/2015      Chemistry      Component Value Date/Time   NA 137 11/16/2015 0800   NA 136 11/09/2015 0449   K 4.3 11/16/2015 0800   K 4.3 11/09/2015 0449   CL 109 11/09/2015 0449   CL 107 03/10/2013 1352   CO2 19* 11/16/2015 0800   CO2 22 11/09/2015 0449   BUN 19.5 11/16/2015 0800   BUN 11 11/09/2015 0449   CREATININE 1.4* 11/16/2015 0800   CREATININE 1.00 11/09/2015 0449      Component Value Date/Time   CALCIUM 9.4 11/16/2015 0800   CALCIUM 8.5* 11/09/2015 0449   ALKPHOS 96 11/16/2015 0800   ALKPHOS 69 11/09/2015 0449   AST 18 11/16/2015 0800   AST 20 11/09/2015 0449   ALT 27 11/16/2015 0800   ALT 27 11/09/2015 0449   BILITOT 0.35 11/16/2015 0800   BILITOT 0.3 11/09/2015 0449      STUDIES: Ct Abdomen Pelvis W Contrast  11/07/2015  CLINICAL DATA:  Patient with multiple myeloma diagnosed October, 2012. Last chemotherapy treatment 11/05/2015. Lower abdominal pain and cramping today. Initial encounter. EXAM: CT ABDOMEN AND PELVIS WITH CONTRAST TECHNIQUE: Multidetector CT imaging of the abdomen and pelvis was performed using the standard protocol following bolus administration of intravenous contrast. CONTRAST:  25 mL OMNIPAQUE IOHEXOL 300 MG/ML SOLN, 80 mL OMNIPAQUE IOHEXOL 300 MG/ML SOLN COMPARISON:  PET CT scan 04/01/2013.  Osseous survey 12/19/[name]. FINDINGS: The lung bases are clear. No pleural or pericardial effusion. Heart size is normal. Calcific coronary artery disease is seen. Small hiatal hernia is noted. A few small gallstones are identified there is no CT evidence of cholecystitis. The liver, spleen, adrenal glands, kidneys and pancreas appear normal. Small bowel loops are  dilated up to approximately 3.3 cm. Fecalized contents of small  bowel are seen in the mid and right upper quadrant of the abdomen. Transition point appears to the in the right upper quadrant where an angulated loop of bowel is identified. Distal small bowel loops are completely decompressed. There is a small volume of free pelvic fluid. No pneumatosis, free intraperitoneal air or portal venous gas is identified. The patient has a right inguinal hernia containing a loop of small bowel but there is no transition point in this location. The colon demonstrates scattered diverticular disease without diverticulitis. There is a large volume of stool in the ascending and transverse colon. The appendix appears normal. No lymphadenopathy is identified. Scattered aorto iliac atherosclerosis without aneurysm is seen. Bones demonstrate a mild, anterior superior endplate compression fracture of T12. Scattered punctate lucent lesions are identified throughout the spine and likely in the pelvis and hips consistent with the patient's history of multiple myeloma. IMPRESSION: The study is positive for small bowel obstruction with a transition poorly likely in the right upper quadrant of the abdomen where an angulated loop of bowel is identified. Right inguinal hernia contains a loop of small bowel but no associated complication such as obstruction. Prominent stool burden ascending and transverse colon. Diverticulosis without diverticulitis. Aortoiliac atherosclerosis. Gallstones without cholecystitis. Punctate lucent lesions in the bones are consistent with history of multiple myeloma. Mild superior endplate compression fracture of T12 is seen on the comparison osseous survey. Calcific coronary artery disease. Electronically Signed   By: Inge Rise M.D.   On: 11/07/2015 17:14   Dg Abd 2 Views  11/08/2015  CLINICAL DATA:  Follow-up small bowel obstruction, abdominal cramping all over EXAM: ABDOMEN - 2 VIEW COMPARISON:  CT  11/07/2015 FINDINGS: NG tube is in the stomach. Large volume stool within the colon. Oral contrast material within the right colon. No current visible dilated small bowel loops. No free air organomegaly. IMPRESSION: No current visible dilated small bowel loops. Electronically Signed   By: Rolm Baptise M.D.   On: 11/08/2015 09:52    ASSESSMENT: 65 y.o.  [place] man with a history of multiple myeloma diagnosed February of 2012, with an initial M-spike of 2.6 g/dL, IFE showing an IgG lambda paraprotein and free lambda light chains in the urine. Cytogenetics showed trisomy 73. Bone marrow biopsy showed a 22% plasmacytosis. Treated with   (1) bortezomib (subcutaneously), lenalidomide, and dexamethasone, with repeat bone marrow biopsy May of 2012 showing 10% plasmacytosis   (2) high-dose chemotherapy with BCNU and melphalan at [facility], followed by stem cell rescue July of 2012   (3) on zoledronic acid started December of 2012, initially monthly, currently given every 3 months, most recent dose 02/17/2014  (4) low-dose lenalidomide resumed Andre 2013, interrupted several times since.  Resumed again on 02/19/2013, and a dose of 5 mg daily, 21 days on and 7 days off, later further reduced to 7 days on, 7 days off  (5) CNS symptoms and abnormal brain MRI extensively evaluated by neurology with no definitive diagnosis established; improving   (6) rising M spike noted June 2014 but did not meet criteria for carfilzomib study  (7) on lenalidomide 25 mg daily, 14 days on, 7 days off, starting 04/18/2013, interrupted December 2014 because of rash;   (a) resumed January 2015 at 10 mg/ day at 21 days on/ 7 days off  (Ilijah) starting 08/18/2014 decreasedto 10 mg/ day 14 days on, 7 days off because of cytopenias  (c) as of February [name] he has been on 5 mg daily 7 days on 7  days off  (d) lenalidomide discontinued December [name] with evidence of disease progression  (8) transient global amnesia 09/10/[name], resolved  without intervention  (9) starting PVD 10/25/2015 w ASA 325 thromboprophylaxis, valacyclovir prophylaxis  (a) pomalidomide 4 mg/d days 1-14  (Trevious) bortezomib sQ days 2,5,9,,12 of each 21 day cycle  (c) dexamethasome 20 mg two days a week   PLAN:  Andre [name] has gotten his constipation under control, and as a result is feeling better overall. He knows to keep up the water and fiber intake, and continue with miralax BID.   The labs were reviewed in detail, his platelets are low but stable at 47. He will proceed with day 11, cycle 2 velcade as planned today. His light chains ratio has normalized according to the 2/28 labs.  I have put some more appointments on the calendar. He will return on 3/21 for follow up and the start of cycle 3. He will see Dr. [name] on 4/11 with the start of cycle 4. On 4/12 he has a return appointment with Dr. Kendell Bane office. He is looking to have a transplant sometime in May or June.  Andre [name] understands and agrees with this plan. He has been encouraged to call with any issues that might arise before his next visit here.   Laurie Panda, NP    11/26/2015

## 2015-11-26 NOTE — Patient Instructions (Signed)
[facility] Discharge Instructions for Patients Receiving Chemotherapy  Today you received the following chemotherapy agents Velcade. To help prevent nausea and vomiting after your treatment, we encourage you to take your nausea medication as directed.  If you develop nausea and vomiting that is not controlled by your nausea medication, call the clinic.   BELOW ARE SYMPTOMS THAT SHOULD BE REPORTED IMMEDIATELY:  *FEVER GREATER THAN 100.5 F  *CHILLS WITH OR WITHOUT FEVER  NAUSEA AND VOMITING THAT IS NOT CONTROLLED WITH YOUR NAUSEA MEDICATION  *UNUSUAL SHORTNESS OF BREATH  *UNUSUAL BRUISING OR BLEEDING  TENDERNESS IN MOUTH AND THROAT WITH OR WITHOUT PRESENCE OF ULCERS  *URINARY PROBLEMS  *BOWEL PROBLEMS  UNUSUAL RASH Items with * indicate a potential emergency and should be followed up as soon as possible.  Feel free to call the clinic you have any questions or concerns. The clinic phone number is [phone].  Please show the CHEMO ALERT CARD at check-in to the Emergency Department and triage nurse.    

## 2015-11-26 NOTE — Progress Notes (Signed)
Reviewed labs with Dr. [name]. Okay to treat with Platelets of 47 today (11/26/15) and CMET from 11/16/15 per Dr. [name].

## 2015-11-27 LAB — PROTEIN / CREATININE RATIO, URINE
Creatinine, Urine: 39.5 mg/dL
PROTEIN,TOTAL,URINE: <4 mg/dL

## 2015-11-30 ENCOUNTER — Ambulatory Visit: Payer: Commercial Managed Care - PPO

## 2015-12-01 ENCOUNTER — Other Ambulatory Visit: Payer: Self-pay | Admitting: *Deleted

## 2015-12-01 NOTE — Telephone Encounter (Signed)
TC from patient requesting refill on his Pomalyst to [place].  Please call patient when refill has been called/fax'd to pharmacy.

## 2015-12-01 NOTE — Telephone Encounter (Signed)
TC from [place] @ [place] requesting refill prescription for pt's Pomalyst

## 2015-12-02 ENCOUNTER — Other Ambulatory Visit: Payer: Self-pay | Admitting: *Deleted

## 2015-12-02 MED ORDER — POMALIDOMIDE 4 MG PO CAPS: ORAL_CAPSULE | ORAL | Status: DC

## 2015-12-06 ENCOUNTER — Telehealth: Payer: Self-pay | Admitting: Pharmacist

## 2015-12-06 NOTE — Telephone Encounter (Signed)
Follow up call for Pomalyst management. Pt unavailable.  I left a voicemail. He is coming to [facility] tomorrow for infusion.  I will try to speak to him there tomorrow. Kennith Center, Pharm.D., CPP 12/06/2015@4 :30 PM

## 2015-12-07 ENCOUNTER — Ambulatory Visit (HOSPITAL_BASED_OUTPATIENT_CLINIC_OR_DEPARTMENT_OTHER): Payer: Commercial Managed Care - PPO

## 2015-12-07 ENCOUNTER — Ambulatory Visit (HOSPITAL_BASED_OUTPATIENT_CLINIC_OR_DEPARTMENT_OTHER): Payer: Commercial Managed Care - PPO | Admitting: Nurse Practitioner

## 2015-12-07 ENCOUNTER — Other Ambulatory Visit (HOSPITAL_BASED_OUTPATIENT_CLINIC_OR_DEPARTMENT_OTHER): Payer: Commercial Managed Care - PPO

## 2015-12-07 ENCOUNTER — Encounter: Payer: Self-pay | Admitting: Pharmacist

## 2015-12-07 ENCOUNTER — Other Ambulatory Visit: Payer: Self-pay

## 2015-12-07 ENCOUNTER — Encounter: Payer: Self-pay | Admitting: Nurse Practitioner

## 2015-12-07 VITALS — BP 115/73 | HR 103 | Temp 98.2°F | Resp 18 | Ht 70.0 in | Wt 187.0 lb

## 2015-12-07 DIAGNOSIS — Z5112 Encounter for antineoplastic immunotherapy: Secondary | ICD-10-CM

## 2015-12-07 DIAGNOSIS — C9002 Multiple myeloma in relapse: Secondary | ICD-10-CM

## 2015-12-07 DIAGNOSIS — C9000 Multiple myeloma not having achieved remission: Secondary | ICD-10-CM

## 2015-12-07 LAB — COMPREHENSIVE METABOLIC PANEL
ALBUMIN: 3.7 g/dL (ref 3.5–5.0)
ALK PHOS: 81 U/L (ref 40–150)
ALT: 25 U/L (ref 0–55)
AST: 22 U/L (ref 5–34)
Anion Gap: 10 mEq/L (ref 3–11)
BILIRUBIN TOTAL: 0.39 mg/dL (ref 0.20–1.20)
BUN: 18.5 mg/dL (ref 7.0–26.0)
CO2: 19 meq/L — AB (ref 22–29)
Calcium: 9.2 mg/dL (ref 8.4–10.4)
Chloride: 108 mEq/L (ref 98–109)
Creatinine: 1.3 mg/dL (ref 0.7–1.3)
EGFR: 57 mL/min/{1.73_m2} — ABNORMAL LOW (ref >90)
GLUCOSE: 117 mg/dL (ref 70–140)
Potassium: 4.7 mEq/L (ref 3.5–5.1)
SODIUM: 137 meq/L (ref 136–145)
TOTAL PROTEIN: 6.8 g/dL (ref 6.4–8.3)

## 2015-12-07 LAB — CBC WITH DIFFERENTIAL/PLATELET
BASO%: 0.4 % (ref 0.0–2.0)
BASOS ABS: 0 10*3/uL (ref 0.0–0.1)
EOS ABS: 0 10*3/uL (ref 0.0–0.5)
EOS%: 0.1 % (ref 0.0–7.0)
HCT: 33.3 % — ABNORMAL LOW (ref 38.4–49.9)
HEMOGLOBIN: 11 g/dL — AB (ref 13.0–17.1)
LYMPH#: 0.4 10*3/uL — AB (ref 0.9–3.3)
LYMPH%: 14.8 % (ref 14.0–49.0)
MCH: 32.3 pg (ref 27.2–33.4)
MCHC: 33 g/dL (ref 32.0–36.0)
MCV: 97.9 fL (ref 79.3–98.0)
MONO#: 0.1 10*3/uL (ref 0.1–0.9)
MONO%: 2.7 % (ref 0.0–14.0)
NEUT%: 82 % — ABNORMAL HIGH (ref 39.0–75.0)
NEUTROS ABS: 2 10*3/uL (ref 1.5–6.5)
Platelets: 110 10*3/uL — ABNORMAL LOW (ref 140–400)
RBC: 3.4 10*6/uL — AB (ref 4.20–5.82)
RDW: 18.6 % — AB (ref 11.0–14.6)
WBC: 2.4 10*3/uL — ABNORMAL LOW (ref 4.0–10.3)

## 2015-12-07 MED ORDER — BORTEZOMIB CHEMO SQ INJECTION 3.5 MG (2.5MG/ML)
1.3000 mg/m2 | Freq: Once | INTRAMUSCULAR | Status: AC
  Administered 2015-12-07: 2.75 mg via SUBCUTANEOUS
  Filled 2015-12-07: qty 2.75

## 2015-12-07 MED ORDER — PROCHLORPERAZINE MALEATE 10 MG PO TABS
10.0000 mg | ORAL_TABLET | Freq: Once | ORAL | Status: AC
  Administered 2015-12-07: 10 mg via ORAL

## 2015-12-07 MED ORDER — ZOLEDRONIC ACID 4 MG/100ML IV SOLN
4.0000 mg | Freq: Once | INTRAVENOUS | Status: AC
  Administered 2015-12-07: 4 mg via INTRAVENOUS
  Filled 2015-12-07: qty 100

## 2015-12-07 MED ORDER — PROCHLORPERAZINE MALEATE 10 MG PO TABS
ORAL_TABLET | ORAL | Status: AC
  Filled 2015-12-07: qty 1

## 2015-12-07 MED ORDER — SODIUM CHLORIDE 0.9 % IV SOLN
Freq: Once | INTRAVENOUS | Status: AC
  Administered 2015-12-07: 13:00:00 via INTRAVENOUS

## 2015-12-07 NOTE — Progress Notes (Signed)
ID: Andre [name]   DOB: 03/30/1951  MR#: [id number]  NID#:[id number]   PCP: Andre [name] SU: OTHER MD: Andre [name], Andre [name], [address] [place] [phone]), Andre [name]  CHIEF COMPLAINT: Multiple myeloma  CURRENT TREATMENT: Bortezomib, pomalidomide, dexamethasone  HISTORY OF PRESENT ILLNESS: From the original intake nodes:  The patient was worked up for peptic ulcer disease in August of 2011, with significant bleeding and anemia.  The patient was Helicobacter pylori negative.  He received some epinephrine when he had his EGD and then started on Protonix.  [name] anemia slowly resolved so that by September, his hemoglobin was up to 10.9 and by earlier this month, his hemoglobin was up to 12.5.    As part of his general workup, he was found to have a slightly elevated globulin fraction.  In September, his total protein was 8.3 with an albumin of 3.8.  In January, the total protein was 8.4 and albumin 3.6.  With persistence of this slight abnormality, Dr. Olevia [name] obtained serum immunofixation and SPEP.  The SPTP showed an M-spike of 2.67 grams.  A total IgG was 4,190.  Total IgA low at 28.  Total IgM low at 28 also.  The immunofixation showed a monoclonal IgG lambda paraprotein.  There were also monoclonal free lambda light chains present. With this information, the patient was referred for further evaluation.   A diagnosis of myeloma was confirmed by bone marrow biopsy and subsequebnt treatment is as detailed below  INTERVAL HISTORY: Andre [name] returns today for follow-up of his relapsed multiple myeloma, alone. Today is day 1, cycle 3 of bortezomib which he receives on days 1, 4, 8 and 11 every 21 days. He only has nausea the day after this injection and compazine his helpful. He takes pomalidomide on days 1-14. He notices some fatigue and weakness while on this.  Dexamethasone 7m is also taken twice weekly.   REVIEW OF SYSTEMS: Andre Reichmannhas a good  strategy for his constipation using miralax and dulcolax PRN. He moves his bowels about every other day with some mild bloating. His appetite is fair. He has no pain beside mild twinges to his lower back. He denies mouth sores, rashes, or neuropathy symptoms. He is not sleeping well, getting a few hours here and there with tylenol PM and ambien. A detailed review of systems is otherwise stable.  PAST MEDICAL HISTORY: Past Medical History  Diagnosis Date  . Hypertension   . Hyperlipidemia   . Duodenal ulcer   . Allergy   . Thyroid disease   . GERD (gastroesophageal reflux disease)   . Depression   . Anemia   . Hypothyroidism   . Asthma     no treatment x 20 years  . Arthritis   . Double vision     occurs at times   . Multiple myeloma 07/04/2011  Significant for peptic ulcer disease as noted above.  History of hyperlipidemia.  History of anxiety and depression.  History of GERD which is significantly improved with weight loss and history of reactive airway disease, possibly secondary to the GERD which also improved with weight loss.  There was a history of obesity, now much improved secondary to exercise and diet.   PAST SURGICAL HISTORY: Past Surgical History  Procedure Laterality Date  . Cardiolite study  11/25/2003    NORMAL  . Bone marrow transplant  2011    for MM  . Tonsillectomy      FAMILY HISTORY Family History  Problem  Relation Age of Onset  . Andre [name]   . Hypertension Father   . Stroke Father   . Asthma Father   . Diabetes Father   . Pancreatic cancer Brother   [name] father died at the age of 20 following a stroke.  [name] [name] died with cancer of the Andre at the age of 62.   She had a history of depression and was a smoker.  Not clear how much alcohol she drank, according to the patient.  The patient had two brothers.  One died from the age of 45 from a "kidney problem."  The second one died at the age of 24 from pancreatic cancer. (This  may have been duodenal cancer. 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  His wife of 37+ years was Andre [name], but they are now divorced. In 2016 he remarried, his current wife's name is Andre [name].  The patient has two daughters:  Andre [name], who is expecting her second child April 2016. .  They both live here in [place]. 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  The patient is very close to his daughters.  [place] first daughter, Andre [name], born 03/06/2013, apparently has Weber/Osler/Rendu. The patient is not a church attender.  He does derive a great deal of support from friends at the "[employer]" which he attends very regularly, and also participates in Al-Anon even though actually there is no alcohol or drug problem in the family.  He participates in this group and he gets quite a bit of support from it.     ADVANCED DIRECTIVES: In place  HEALTH MAINTENANCE: (updated 07/16/2013) Social History  Substance Use Topics  . Smoking status: Former Smoker -- 3 years    Quit date: 03/27/1969  . Smokeless tobacco: Never Used  . Alcohol Use: No     Colonoscopy:  PSA:  Sept 2014, normal/Dr. [name]  Lipid panel: Sept 2014/Dr. [name]   Allergies  Allergen Reactions  . Atorvastatin Other (See Comments)  . Rosuvastatin     Other reaction(s): Liver Disorder  . Crestor [Rosuvastatin Calcium]     ADVERSE EFFECTS ON LIVER  . Lipitor [Atorvastatin Calcium]     Current Outpatient Prescriptions  Medication Sig Dispense Refill  . cetirizine (ZYRTEC) 10 MG tablet Take 10 mg by mouth daily.    . cholecalciferol (VITAMIN D) 1000 UNITS tablet Take 1 tablet (1,000 Units total) by mouth daily. 100 tablet 3  . dexamethasone (DECADRON) 6 MG tablet Take 3 tablets by mouth  with food as directed 24 tablet 4  . escitalopram (LEXAPRO) 10 MG tablet Take 1 tablet (10 mg total) by mouth daily. 30 tablet 5  . levothyroxine (SYNTHROID, LEVOTHROID) 150 MCG tablet Take 1 tablet by mouth  daily 90 tablet 3  . metoCLOPramide (REGLAN) 5 MG tablet Take 1 tablet (5 mg total) by mouth every 6 (six) hours as needed for nausea. 90 tablet 1  . pantoprazole (PROTONIX) 40 MG tablet Take 1 tablet (40 mg total) by mouth daily. 30 tablet 0  . polyethylene glycol (MIRALAX / GLYCOLAX) packet Take 17 g by mouth daily.     . pomalidomide (POMALYST) 4 MG capsule Take 1 capsule by mouth with water daily on days 1-14. Repeat every 21  days. 14 capsule 4  . valACYclovir (VALTREX) 1000 MG tablet Take 1 tablet (1,000 mg total) by mouth daily. 60 tablet 6  . zolpidem (AMBIEN) 5 MG tablet Take 1-2 tablets (5-10 mg total) by mouth at bedtime as needed for sleep. 30 tablet 1  . Bisacodyl (DULCOLAX PO) Take 1 tablet by mouth daily as needed (for constipation). Reported on 12/07/2015    . ketoconazole (NIZORAL) 2 % cream Apply 1 application topically daily. (Patient not taking: Reported on 12/07/2015) 15 g 0  . senna (SENOKOT) 8.6 MG tablet Take 1 tablet by mouth 2 (two) times daily. Reported on 12/07/2015    . [DISCONTINUED] fexofenadine (ALLEGRA) 60 MG tablet Take 180 mg by mouth daily.       No current facility-administered medications for this visit.    OBJECTIVE: Middle-aged white ma iIn no acute distress Filed Vitals:   12/07/15 1126  BP: 115/73  Pulse: 103  Temp: 98.2 F (36.8 C)  Resp: 18   Body mass index is 26.83 kg/(m^2).  Filed Weights   12/07/15 1126  Weight: 187 lb (84.823 kg)    Skin: warm, dry  HEENT: sclerae anicteric, conjunctivae pink, oropharynx clear. No thrush or mucositis.  Lymph Nodes: No cervical or supraclavicular lymphadenopathy  Lungs: clear to auscultation bilaterally, no rales, wheezes, or rhonci  Heart: regular rate and rhythm  Abdomen: round, soft, non tender,  positive bowel sounds  Musculoskeletal: No focal spinal tenderness, no peripheral edema  Neuro: non focal, well oriented, positive affect   LAB RESULTS:  Lab Results  Component Value Date   WBC 2.4* 12/07/2015   NEUTROABS 2.0 12/07/2015   HGB 11.0* 12/07/2015   HCT 33.3* 12/07/2015   MCV 97.9 12/07/2015   PLT 110* 12/07/2015      Chemistry      Component Value Date/Time   NA 137 11/16/2015 0800   NA 136 11/09/2015 0449   K 4.3 11/16/2015 0800   K 4.3 11/09/2015 0449   CL 109 11/09/2015 0449   CL 107 03/10/2013 1352   CO2 19* 11/16/2015 0800   CO2 22 11/09/2015 0449   BUN 19.5 11/16/2015 0800   BUN 11 11/09/2015 0449   CREATININE 1.4* 11/16/2015 0800   CREATININE 1.00 11/09/2015 0449      Component Value Date/Time   CALCIUM 9.4 11/16/2015 0800   CALCIUM 8.5* 11/09/2015 0449   ALKPHOS 96 11/16/2015 0800   ALKPHOS 69 11/09/2015 0449   AST 18 11/16/2015 0800   AST 20 11/09/2015 0449   ALT 27 11/16/2015 0800   ALT 27 11/09/2015 0449   BILITOT 0.35 11/16/2015 0800   BILITOT 0.3 11/09/2015 0449      STUDIES: Ct Abdomen Pelvis W Contrast  11/07/2015  CLINICAL DATA:  Patient with multiple myeloma diagnosed October, 2012. Last chemotherapy treatment 11/05/2015. Lower abdominal pain and cramping today. Initial encounter. EXAM: CT ABDOMEN AND PELVIS WITH CONTRAST TECHNIQUE: Multidetector CT imaging of the abdomen and pelvis was performed using the standard protocol following bolus administration of intravenous contrast. CONTRAST:  25 mL OMNIPAQUE IOHEXOL 300 MG/ML SOLN, 80 mL OMNIPAQUE IOHEXOL 300 MG/ML SOLN COMPARISON:  PET CT scan 04/01/2013.  Osseous survey 09/06/2015. FINDINGS: The lung bases are clear. No pleural or pericardial effusion. Heart size is normal. Calcific coronary artery disease is seen. Small hiatal hernia is noted. A few small gallstones are identified there is no CT evidence of cholecystitis. The liver, spleen, adrenal glands, kidneys and pancreas appear  normal. Small bowel loops are dilated up  to approximately 3.3 cm. Fecalized contents of small bowel are seen in the mid and right upper quadrant of the abdomen. Transition point appears to the in the right upper quadrant where an angulated loop of bowel is identified. Distal small bowel loops are completely decompressed. There is a small volume of free pelvic fluid. No pneumatosis, free intraperitoneal air or portal venous gas is identified. The patient has a right inguinal hernia containing a loop of small bowel but there is no transition point in this location. The colon demonstrates scattered diverticular disease without diverticulitis. There is a large volume of stool in the ascending and transverse colon. The appendix appears normal. No lymphadenopathy is identified. Scattered aorto iliac atherosclerosis without aneurysm is seen. Bones demonstrate a mild, anterior superior endplate compression fracture of T12. Scattered punctate lucent lesions are identified throughout the spine and likely in the pelvis and hips consistent with the patient's history of multiple myeloma. IMPRESSION: The study is positive for small bowel obstruction with a transition poorly likely in the right upper quadrant of the abdomen where an angulated loop of bowel is identified. Right inguinal hernia contains a loop of small bowel but no associated complication such as obstruction. Prominent stool burden ascending and transverse colon. Diverticulosis without diverticulitis. Aortoiliac atherosclerosis. Gallstones without cholecystitis. Punctate lucent lesions in the bones are consistent with history of multiple myeloma. Mild superior endplate compression fracture of T12 is seen on the comparison osseous survey. Calcific coronary artery disease. Electronically Signed   By: Inge Rise M.D.   On: 11/07/2015 17:14   Dg Abd 2 Views  11/08/2015  CLINICAL DATA:  Follow-up small bowel obstruction, abdominal cramping all over EXAM: ABDOMEN  - 2 VIEW COMPARISON:  CT 11/07/2015 FINDINGS: NG tube is in the stomach. Large volume stool within the colon. Oral contrast material within the right colon. No current visible dilated small bowel loops. No free air organomegaly. IMPRESSION: No current visible dilated small bowel loops. Electronically Signed   By: Rolm Baptise M.D.   On: 11/08/2015 09:52    ASSESSMENT: 65 y.o.  [place] man with a history of multiple myeloma diagnosed February of 2012, with an initial M-spike of 2.6 g/dL, IFE showing an IgG lambda paraprotein and free lambda light chains in the urine. Cytogenetics showed trisomy 16. Bone marrow biopsy showed a 22% plasmacytosis. Treated with   (1) bortezomib (subcutaneously), lenalidomide, and dexamethasone, with repeat bone marrow biopsy May of 2012 showing 10% plasmacytosis   (2) high-dose chemotherapy with BCNU and melphalan at [facility], followed by stem cell rescue July of 2012   (3) on zoledronic acid started December of 2012, initially monthly, currently given every 3 months, most recent dose 02/17/2014  (4) low-dose lenalidomide resumed April 2013, interrupted several times since.  Resumed again on 02/19/2013, and a dose of 5 mg daily, 21 days on and 7 days off, later further reduced to 7 days on, 7 days off  (5) CNS symptoms and abnormal brain MRI extensively evaluated by neurology with no definitive diagnosis established; improving   (6) rising M spike noted June 2014 but did not meet criteria for carfilzomib study  (7) on lenalidomide 25 mg daily, 14 days on, 7 days off, starting 04/18/2013, interrupted December 2014 because of rash;   (a) resumed January 2015 at 10 mg/ day at 21 days on/ 7 days off  ([place]) starting 08/18/2014 decreasedto 10 mg/ day 14 days on, 7 days off because of cytopenias  (c) as of February 2016 he has been  on 5 mg daily 7 days on 7 days off  (d) lenalidomide discontinued December 2016 with evidence of disease progression  (8) transient global amnesia  05/29/2015, resolved without intervention  (9) starting PVD 10/25/2015 w ASA 325 thromboprophylaxis, valacyclovir prophylaxis  (a) pomalidomide 4 mg/d days 1-14  (Zaylon) bortezomib sQ days 2,5,9,12 of each 21 day cycle  (c) dexamethasome 20 mg two days a week   PLAN:  The labs were reviewed in detail and are sufficient for treatment. His platelets have improved to 110. He will proceed with his next bortezomib injection today. He is also due for zometa. The SPEP and light chains are still pending, but those have been normalizing for the past month.   He is awaiting a call from the transplant coordinator at [facility]. He believes he will have a meeting with this team prior to Dr. Kendell Bane appointment on 4/12. He will see Dr. [name] on 4/11 with the start of cycle 4. Andre [name] understands and agrees with this plan. He has been encouraged to call with any issues that might arise before his next visit here.   Laurie Panda, NP    12/07/2015

## 2015-12-07 NOTE — Patient Instructions (Addendum)
[facility] Discharge Instructions for Patients Receiving Chemotherapy  Today you received the following chemotherapy agents: Velcade.  To help prevent nausea and vomiting after your treatment, we encourage you to take your nausea medication as directed.   If you develop nausea and vomiting that is not controlled by your nausea medication, call the clinic.   BELOW ARE SYMPTOMS THAT SHOULD BE REPORTED IMMEDIATELY:  *FEVER GREATER THAN 100.5 F  *CHILLS WITH OR WITHOUT FEVER  NAUSEA AND VOMITING THAT IS NOT CONTROLLED WITH YOUR NAUSEA MEDICATION  *UNUSUAL SHORTNESS OF BREATH  *UNUSUAL BRUISING OR BLEEDING  TENDERNESS IN MOUTH AND THROAT WITH OR WITHOUT PRESENCE OF ULCERS  *URINARY PROBLEMS  *BOWEL PROBLEMS  UNUSUAL RASH Items with * indicate a potential emergency and should be followed up as soon as possible.  Feel free to call the clinic you have any questions or concerns. The clinic phone number is [phone].  Please show the [address] at check-in to the Emergency Department and triage nurse.   Zoledronic Acid injection (Paget's Disease, Osteoporosis) What is this medicine? ZOLEDRONIC ACID (ZOE le dron ik AS id) lowers the amount of calcium loss from bone. It is used to treat Paget's disease and osteoporosis in women. This medicine may be used for other purposes; ask your health care provider or pharmacist if you have questions. What should I tell my health care provider before I take this medicine? They need to know if you have any of these conditions: -aspirin-sensitive asthma -cancer, especially if you are receiving medicines used to treat cancer -dental disease or wear dentures -infection -kidney disease -low levels of calcium in the blood -past surgery on the parathyroid gland or intestines -receiving corticosteroids like dexamethasone or prednisone -an unusual or allergic reaction to zoledronic acid, other medicines, foods, dyes, or  preservatives -pregnant or trying to get pregnant -breast-feeding How should I use this medicine? This medicine is for infusion into a vein. It is given by a health care professional in a hospital or clinic setting. Talk to your pediatrician regarding the use of this medicine in children. This medicine is not approved for use in children. Overdosage: If you think you have taken too much of this medicine contact a poison control center or emergency room at once. NOTE: This medicine is only for you. Do not share this medicine with others. What if I miss a dose? It is important not to miss your dose. Call your doctor or health care professional if you are unable to keep an appointment. What may interact with this medicine? -certain antibiotics given by injection -NSAIDs, medicines for pain and inflammation, like ibuprofen or naproxen -some diuretics like bumetanide, furosemide -teriparatide This list may not describe all possible interactions. Give your health care provider a list of all the medicines, herbs, non-prescription drugs, or dietary supplements you use. Also tell them if you smoke, drink alcohol, or use illegal drugs. Some items may interact with your medicine. What should I watch for while using this medicine? Visit your doctor or health care professional for regular checkups. It may be some time before you see the benefit from this medicine. Do not stop taking your medicine unless your doctor tells you to. Your doctor may order blood tests or other tests to see how you are doing. Women should inform their doctor if they wish to become pregnant or think they might be pregnant. There is a potential for serious side effects to an unborn child. Talk to your health  care professional or pharmacist for more information. You should make sure that you get enough calcium and vitamin D while you are taking this medicine. Discuss the foods you eat and the vitamins you take with your health care  professional. Some people who take this medicine have severe bone, joint, and/or muscle pain. This medicine may also increase your risk for jaw problems or a broken thigh bone. Tell your doctor right away if you have severe pain in your jaw, bones, joints, or muscles. Tell your doctor if you have any pain that does not go away or that gets worse. Tell your dentist and dental surgeon that you are taking this medicine. You should not have major dental surgery while on this medicine. See your dentist to have a dental exam and fix any dental problems before starting this medicine. Take good care of your teeth while on this medicine. Make sure you see your dentist for regular follow-up appointments. What side effects may I notice from receiving this medicine? Side effects that you should report to your doctor or health care professional as soon as possible: -allergic reactions like skin rash, itching or hives, swelling of the face, lips, or tongue -anxiety, confusion, or depression -breathing problems -changes in vision -eye pain -feeling faint or lightheaded, falls -jaw pain, especially after dental work -mouth sores -muscle cramps, stiffness, or weakness -redness, blistering, peeling or loosening of the skin, including inside the mouth -trouble passing urine or change in the amount of urine Side effects that usually do not require medical attention (report to your doctor or health care professional if they continue or are bothersome): -bone, joint, or muscle pain -constipation -diarrhea -fever -hair loss -irritation at site where injected -loss of appetite -nausea, vomiting -stomach upset -trouble sleeping -trouble swallowing -weak or tired This list may not describe all possible side effects. Call your doctor for medical advice about side effects. You may report side effects to FDA at 1-800-FDA-1088. Where should I keep my medicine? This drug is given in a hospital or clinic and will not  be stored at home. NOTE: This sheet is a summary. It may not cover all possible information. If you have questions about this medicine, talk to your doctor, pharmacist, or health care provider.    2016, Elsevier/Gold Standard. (2014-01-31 14:19:57)

## 2015-12-07 NOTE — Progress Notes (Signed)
Oral Chemotherapy Follow-Up   Original Start date of oral chemotherapy: 10/25/15   I spoke with patient today during his chemo infusion to follow up regarding patient's oral chemotherapy medication: Pomalyst  Pt reports 0 tablets/doses missed in the 2 weeks. He reports (correctly) taking 4 mg daily on days 1-14,   Pt reports the following side effects: Constipation. He is currently using Miralax once daily.  He takes it BID only on the weekends when he isn't working.  He is drinking a lot of water, even has a bottle of water on his tray table in the infusion area today.  He has 1-2 formed stools daily and feels his constipation is currently controlled. He is trying to eat more fiber/salads.  Pt states his appetite is "too good."      Weakness.  Generally true that he is weak/tired and lacks strength.  He gets SOB when climbing 3 flights of stairs at work when before now he did not experience this.  Other Issues: Trouble obtaining his drug.  He says there is this "passing the buck" between [employer] and our office.  He has called [facility] and they blame [name] staff for delaying the refills.  He just got drug supply today for tonight's dose.  So far he has been lucky and hasn't missed doses due to delay in receiving his drug.   Will follow up and call patient again in 1 month unless needed before then.   Thank you,  Kennith Center, Pharm.D., CPP 12/07/2015@3 :14 PM Oral Chemotherapy Clinic

## 2015-12-08 LAB — KAPPA/LAMBDA LIGHT CHAINS
IG KAPPA FREE LIGHT CHAIN: 35.01 mg/L — AB (ref 3.30–19.40)
IG LAMBDA FREE LIGHT CHAIN: 29.4 mg/L — AB (ref 5.71–26.30)
Kappa/Lambda FluidC Ratio: 1.19 (ref 0.26–1.65)

## 2015-12-09 LAB — PROTEIN ELECTROPHORESIS, SERUM
A/G RATIO SPE: 1.2 (ref 0.7–1.7)
ALPHA 1: 0.3 g/dL (ref 0.0–0.4)
Albumin: 3.6 g/dL (ref 2.9–4.4)
Alpha 2: 0.7 g/dL (ref 0.4–1.0)
Beta: 1.2 g/dL (ref 0.7–1.3)
GAMMA GLOBULIN: 0.8 g/dL (ref 0.4–1.8)
GLOBULIN, TOTAL: 3 g/dL (ref 2.2–3.9)
M-SPIKE, %: 0.4 g/dL — AB
TOTAL PROTEIN: 6.6 g/dL (ref 6.0–8.5)

## 2015-12-09 NOTE — Progress Notes (Signed)
Thank you :)

## 2015-12-10 ENCOUNTER — Ambulatory Visit (HOSPITAL_BASED_OUTPATIENT_CLINIC_OR_DEPARTMENT_OTHER): Payer: Commercial Managed Care - PPO

## 2015-12-10 ENCOUNTER — Other Ambulatory Visit (HOSPITAL_BASED_OUTPATIENT_CLINIC_OR_DEPARTMENT_OTHER): Payer: Commercial Managed Care - PPO

## 2015-12-10 VITALS — BP 121/63 | HR 55 | Temp 97.8°F | Resp 20

## 2015-12-10 DIAGNOSIS — C9002 Multiple myeloma in relapse: Secondary | ICD-10-CM

## 2015-12-10 DIAGNOSIS — C9000 Multiple myeloma not having achieved remission: Secondary | ICD-10-CM

## 2015-12-10 DIAGNOSIS — Z5112 Encounter for antineoplastic immunotherapy: Secondary | ICD-10-CM | POA: Diagnosis not present

## 2015-12-10 LAB — COMPREHENSIVE METABOLIC PANEL
ALT: 21 U/L (ref 0–55)
AST: 17 U/L (ref 5–34)
Albumin: 3.3 g/dL — ABNORMAL LOW (ref 3.5–5.0)
Alkaline Phosphatase: 72 U/L (ref 40–150)
Anion Gap: 8 mEq/L (ref 3–11)
BUN: 20.7 mg/dL (ref 7.0–26.0)
CO2: 22 mEq/L (ref 22–29)
Calcium: 9.2 mg/dL (ref 8.4–10.4)
Chloride: 108 mEq/L (ref 98–109)
Creatinine: 1.2 mg/dL (ref 0.7–1.3)
EGFR: 65 mL/min/{1.73_m2} — ABNORMAL LOW (ref >90)
Glucose: 104 mg/dl (ref 70–140)
Potassium: 4.6 mEq/L (ref 3.5–5.1)
Sodium: 138 mEq/L (ref 136–145)
Total Bilirubin: 0.32 mg/dL (ref 0.20–1.20)
Total Protein: 6.4 g/dL (ref 6.4–8.3)

## 2015-12-10 LAB — CBC WITH DIFFERENTIAL/PLATELET
BASO%: 1 % (ref 0.0–2.0)
Basophils Absolute: 0 10*3/uL (ref 0.0–0.1)
EOS%: 0.9 % (ref 0.0–7.0)
Eosinophils Absolute: 0 10*3/uL (ref 0.0–0.5)
HEMATOCRIT: 31.1 % — AB (ref 38.4–49.9)
HEMOGLOBIN: 10.3 g/dL — AB (ref 13.0–17.1)
LYMPH#: 0.3 10*3/uL — AB (ref 0.9–3.3)
LYMPH%: 14.9 % (ref 14.0–49.0)
MCH: 32.8 pg (ref 27.2–33.4)
MCHC: 33.2 g/dL (ref 32.0–36.0)
MCV: 98.9 fL — ABNORMAL HIGH (ref 79.3–98.0)
MONO#: 0.2 10*3/uL (ref 0.1–0.9)
MONO%: 8.6 % (ref 0.0–14.0)
NEUT#: 1.5 10*3/uL (ref 1.5–6.5)
NEUT%: 74.6 % (ref 39.0–75.0)
PLATELETS: 95 10*3/uL — AB (ref 140–400)
RBC: 3.14 10*6/uL — ABNORMAL LOW (ref 4.20–5.82)
RDW: 19.4 % — AB (ref 11.0–14.6)
WBC: 2 10*3/uL — ABNORMAL LOW (ref 4.0–10.3)

## 2015-12-10 MED ORDER — PROCHLORPERAZINE MALEATE 10 MG PO TABS
10.0000 mg | ORAL_TABLET | Freq: Once | ORAL | Status: AC
  Administered 2015-12-10: 10 mg via ORAL

## 2015-12-10 MED ORDER — BORTEZOMIB CHEMO SQ INJECTION 3.5 MG (2.5MG/ML)
1.3000 mg/m2 | Freq: Once | INTRAMUSCULAR | Status: AC
  Administered 2015-12-10: 2.75 mg via SUBCUTANEOUS
  Filled 2015-12-10: qty 2.75

## 2015-12-10 MED ORDER — PROCHLORPERAZINE MALEATE 10 MG PO TABS
ORAL_TABLET | ORAL | Status: AC
Start: 2015-12-10 — End: 2015-12-10
  Filled 2015-12-10: qty 1

## 2015-12-10 NOTE — Patient Instructions (Signed)
[facility] Discharge Instructions for Patients Receiving Chemotherapy  Today you received the following chemotherapy agents:  Velcade  To help prevent nausea and vomiting after your treatment, we encourage you to take your nausea medication as ordered per MD.   If you develop nausea and vomiting that is not controlled by your nausea medication, call the clinic.   BELOW ARE SYMPTOMS THAT SHOULD BE REPORTED IMMEDIATELY:  *FEVER GREATER THAN 100.5 F  *CHILLS WITH OR WITHOUT FEVER  NAUSEA AND VOMITING THAT IS NOT CONTROLLED WITH YOUR NAUSEA MEDICATION  *UNUSUAL SHORTNESS OF BREATH  *UNUSUAL BRUISING OR BLEEDING  TENDERNESS IN MOUTH AND THROAT WITH OR WITHOUT PRESENCE OF ULCERS  *URINARY PROBLEMS  *BOWEL PROBLEMS  UNUSUAL RASH Items with * indicate a potential emergency and should be followed up as soon as possible.  Feel free to call the clinic you have any questions or concerns. The clinic phone number is [phone].  Please show the CHEMO ALERT CARD at check-in to the Emergency Department and triage nurse.   

## 2015-12-10 NOTE — Progress Notes (Signed)
OK to treat with WBC-2.0, ANC-1.5 and platelet count of 95 per Dr. [name].

## 2015-12-13 ENCOUNTER — Telehealth: Payer: Self-pay | Admitting: *Deleted

## 2015-12-13 NOTE — Telephone Encounter (Signed)
This RN received VM from pt stating he needs to have labs per " my

## 2015-12-14 ENCOUNTER — Ambulatory Visit (HOSPITAL_BASED_OUTPATIENT_CLINIC_OR_DEPARTMENT_OTHER): Payer: Commercial Managed Care - PPO

## 2015-12-14 ENCOUNTER — Other Ambulatory Visit: Payer: Self-pay

## 2015-12-14 ENCOUNTER — Other Ambulatory Visit (HOSPITAL_BASED_OUTPATIENT_CLINIC_OR_DEPARTMENT_OTHER): Payer: Commercial Managed Care - PPO

## 2015-12-14 VITALS — BP 112/76 | HR 65 | Temp 97.7°F | Resp 18

## 2015-12-14 DIAGNOSIS — Z5112 Encounter for antineoplastic immunotherapy: Secondary | ICD-10-CM | POA: Diagnosis not present

## 2015-12-14 DIAGNOSIS — C9002 Multiple myeloma in relapse: Secondary | ICD-10-CM

## 2015-12-14 DIAGNOSIS — K279 Peptic ulcer, site unspecified, unspecified as acute or chronic, without hemorrhage or perforation: Secondary | ICD-10-CM

## 2015-12-14 DIAGNOSIS — C9000 Multiple myeloma not having achieved remission: Secondary | ICD-10-CM

## 2015-12-14 LAB — CBC WITH DIFFERENTIAL/PLATELET
BASO%: 0.3 % (ref 0.0–2.0)
Basophils Absolute: 0 10*3/uL (ref 0.0–0.1)
EOS%: 1.5 % (ref 0.0–7.0)
Eosinophils Absolute: 0.1 10*3/uL (ref 0.0–0.5)
HCT: 31.6 % — ABNORMAL LOW (ref 38.4–49.9)
HGB: 10.7 g/dL — ABNORMAL LOW (ref 13.0–17.1)
LYMPH%: 13 % — AB (ref 14.0–49.0)
MCH: 32.8 pg (ref 27.2–33.4)
MCHC: 33.9 g/dL (ref 32.0–36.0)
MCV: 96.9 fL (ref 79.3–98.0)
MONO#: 0.1 10*3/uL (ref 0.1–0.9)
MONO%: 1.8 % (ref 0.0–14.0)
NEUT%: 83.4 % — ABNORMAL HIGH (ref 39.0–75.0)
NEUTROS ABS: 2.8 10*3/uL (ref 1.5–6.5)
PLATELETS: 76 10*3/uL — AB (ref 140–400)
RBC: 3.26 10*6/uL — AB (ref 4.20–5.82)
RDW: 18 % — AB (ref 11.0–14.6)
WBC: 3.3 10*3/uL — AB (ref 4.0–10.3)
lymph#: 0.4 10*3/uL — ABNORMAL LOW (ref 0.9–3.3)
nRBC: 0 % (ref 0–0)

## 2015-12-14 MED ORDER — PROCHLORPERAZINE MALEATE 10 MG PO TABS
10.0000 mg | ORAL_TABLET | Freq: Once | ORAL | Status: AC
  Administered 2015-12-14: 10 mg via ORAL

## 2015-12-14 MED ORDER — PANTOPRAZOLE SODIUM 40 MG PO TBEC: 40.0000 mg | DELAYED_RELEASE_TABLET | Freq: Every day | ORAL | Status: DC

## 2015-12-14 MED ORDER — BORTEZOMIB CHEMO SQ INJECTION 3.5 MG (2.5MG/ML)
1.3000 mg/m2 | Freq: Once | INTRAMUSCULAR | Status: AC
  Administered 2015-12-14: 2.75 mg via SUBCUTANEOUS
  Filled 2015-12-14: qty 2.75

## 2015-12-14 MED ORDER — PROCHLORPERAZINE MALEATE 10 MG PO TABS
ORAL_TABLET | ORAL | Status: AC
  Filled 2015-12-14: qty 1

## 2015-12-14 NOTE — Patient Instructions (Signed)
[facility] Discharge Instructions for Patients Receiving Chemotherapy  Today you received the following chemotherapy agents Velcade. To help prevent nausea and vomiting after your treatment, we encourage you to take your nausea medication as directed.  If you develop nausea and vomiting that is not controlled by your nausea medication, call the clinic.   BELOW ARE SYMPTOMS THAT SHOULD BE REPORTED IMMEDIATELY:  *FEVER GREATER THAN 100.5 F  *CHILLS WITH OR WITHOUT FEVER  NAUSEA AND VOMITING THAT IS NOT CONTROLLED WITH YOUR NAUSEA MEDICATION  *UNUSUAL SHORTNESS OF BREATH  *UNUSUAL BRUISING OR BLEEDING  TENDERNESS IN MOUTH AND THROAT WITH OR WITHOUT PRESENCE OF ULCERS  *URINARY PROBLEMS  *BOWEL PROBLEMS  UNUSUAL RASH Items with * indicate a potential emergency and should be followed up as soon as possible.  Feel free to call the clinic you have any questions or concerns. The clinic phone number is [phone].  Please show the CHEMO ALERT CARD at check-in to the Emergency Department and triage nurse.    

## 2015-12-14 NOTE — Progress Notes (Signed)
OK to treat today with PLT level today per Dr. [name].

## 2015-12-17 ENCOUNTER — Other Ambulatory Visit: Payer: Self-pay | Admitting: Nurse Practitioner

## 2015-12-17 ENCOUNTER — Ambulatory Visit (HOSPITAL_BASED_OUTPATIENT_CLINIC_OR_DEPARTMENT_OTHER): Payer: Commercial Managed Care - PPO

## 2015-12-17 ENCOUNTER — Other Ambulatory Visit (HOSPITAL_BASED_OUTPATIENT_CLINIC_OR_DEPARTMENT_OTHER): Payer: Commercial Managed Care - PPO

## 2015-12-17 VITALS — BP 137/72 | HR 67 | Temp 97.9°F

## 2015-12-17 DIAGNOSIS — C9002 Multiple myeloma in relapse: Secondary | ICD-10-CM

## 2015-12-17 DIAGNOSIS — C9000 Multiple myeloma not having achieved remission: Secondary | ICD-10-CM

## 2015-12-17 DIAGNOSIS — Z5112 Encounter for antineoplastic immunotherapy: Secondary | ICD-10-CM | POA: Diagnosis not present

## 2015-12-17 LAB — CBC WITH DIFFERENTIAL/PLATELET
BASO%: 0 % (ref 0.0–2.0)
Basophils Absolute: 0 10*3/uL (ref 0.0–0.1)
EOS%: 2 % (ref 0.0–7.0)
Eosinophils Absolute: 0.1 10*3/uL (ref 0.0–0.5)
HEMATOCRIT: 31 % — AB (ref 38.4–49.9)
HGB: 10.4 g/dL — ABNORMAL LOW (ref 13.0–17.1)
LYMPH#: 0.3 10*3/uL — AB (ref 0.9–3.3)
LYMPH%: 7.4 % — ABNORMAL LOW (ref 14.0–49.0)
MCH: 32.4 pg (ref 27.2–33.4)
MCHC: 33.5 g/dL (ref 32.0–36.0)
MCV: 96.6 fL (ref 79.3–98.0)
MONO#: 0.4 10*3/uL (ref 0.1–0.9)
MONO%: 9.1 % (ref 0.0–14.0)
NEUT#: 3.3 10*3/uL (ref 1.5–6.5)
NEUT%: 81.5 % — AB (ref 39.0–75.0)
PLATELETS: 63 10*3/uL — AB (ref 140–400)
RBC: 3.21 10*6/uL — ABNORMAL LOW (ref 4.20–5.82)
RDW: 17.8 % — ABNORMAL HIGH (ref 11.0–14.6)
WBC: 4.1 10*3/uL (ref 4.0–10.3)

## 2015-12-17 MED ORDER — BORTEZOMIB CHEMO SQ INJECTION 3.5 MG (2.5MG/ML)
1.3000 mg/m2 | Freq: Once | INTRAMUSCULAR | Status: AC
  Administered 2015-12-17: 2.75 mg via SUBCUTANEOUS
  Filled 2015-12-17: qty 2.75

## 2015-12-17 MED ORDER — PROCHLORPERAZINE MALEATE 10 MG PO TABS
ORAL_TABLET | ORAL | Status: AC
  Filled 2015-12-17: qty 1

## 2015-12-17 MED ORDER — PROCHLORPERAZINE MALEATE 10 MG PO TABS
10.0000 mg | ORAL_TABLET | Freq: Once | ORAL | Status: AC
  Administered 2015-12-17: 10 mg via ORAL

## 2015-12-17 NOTE — Patient Instructions (Signed)
[facility] Discharge Instructions for Patients Receiving Chemotherapy  Today you received the following chemotherapy agents:  Velcade  To help prevent nausea and vomiting after your treatment, we encourage you to take your nausea medication as prescribed.   If you develop nausea and vomiting that is not controlled by your nausea medication, call the clinic.   BELOW ARE SYMPTOMS THAT SHOULD BE REPORTED IMMEDIATELY:  *FEVER GREATER THAN 100.5 F  *CHILLS WITH OR WITHOUT FEVER  NAUSEA AND VOMITING THAT IS NOT CONTROLLED WITH YOUR NAUSEA MEDICATION  *UNUSUAL SHORTNESS OF BREATH  *UNUSUAL BRUISING OR BLEEDING  TENDERNESS IN MOUTH AND THROAT WITH OR WITHOUT PRESENCE OF ULCERS  *URINARY PROBLEMS  *BOWEL PROBLEMS  UNUSUAL RASH Items with * indicate a potential emergency and should be followed up as soon as possible.  Feel free to call the clinic you have any questions or concerns. The clinic phone number is [phone].  Please show the CHEMO ALERT CARD at check-in to the Emergency Department and triage nurse.   

## 2015-12-17 NOTE — Progress Notes (Signed)
OK to treat with platelet count of  63 if patient is willing to  Treat per H. Boelter, NP. Pt is wanting to  Go forward with  Treatment today.

## 2015-12-18 LAB — PROTEIN / CREATININE RATIO, URINE: CREATININE, UR: 23.4 mg/dL

## 2015-12-19 ENCOUNTER — Emergency Department (HOSPITAL_COMMUNITY)
Admission: EM | Admit: 2015-12-19 | Discharge: 2015-12-20 | Disposition: A | Discharge status: Home / Self Care | Payer: Commercial Managed Care - PPO | Source: Home / Self Care | Attending: Physician Assistant | Admitting: Physician Assistant

## 2015-12-19 ENCOUNTER — Other Ambulatory Visit: Payer: Self-pay

## 2015-12-19 ENCOUNTER — Encounter (HOSPITAL_COMMUNITY): Payer: Self-pay | Admitting: Emergency Medicine

## 2015-12-19 ENCOUNTER — Emergency Department (HOSPITAL_COMMUNITY): Payer: Commercial Managed Care - PPO

## 2015-12-19 DIAGNOSIS — M7981 Nontraumatic hematoma of soft tissue: Secondary | ICD-10-CM

## 2015-12-19 DIAGNOSIS — R531 Weakness: Secondary | ICD-10-CM | POA: Diagnosis not present

## 2015-12-19 DIAGNOSIS — K219 Gastro-esophageal reflux disease without esophagitis: Secondary | ICD-10-CM | POA: Insufficient documentation

## 2015-12-19 DIAGNOSIS — M1990 Unspecified osteoarthritis, unspecified site: Secondary | ICD-10-CM

## 2015-12-19 DIAGNOSIS — R059 Cough, unspecified: Secondary | ICD-10-CM

## 2015-12-19 DIAGNOSIS — Z87891 Personal history of nicotine dependence: Secondary | ICD-10-CM | POA: Insufficient documentation

## 2015-12-19 DIAGNOSIS — Z862 Personal history of diseases of the blood and blood-forming organs and certain disorders involving the immune mechanism: Secondary | ICD-10-CM

## 2015-12-19 DIAGNOSIS — R0981 Nasal congestion: Secondary | ICD-10-CM

## 2015-12-19 DIAGNOSIS — C9001 Multiple myeloma in remission: Secondary | ICD-10-CM

## 2015-12-19 DIAGNOSIS — I10 Essential (primary) hypertension: Secondary | ICD-10-CM

## 2015-12-19 DIAGNOSIS — R5383 Other fatigue: Secondary | ICD-10-CM

## 2015-12-19 DIAGNOSIS — Z79899 Other long term (current) drug therapy: Secondary | ICD-10-CM

## 2015-12-19 DIAGNOSIS — E039 Hypothyroidism, unspecified: Secondary | ICD-10-CM

## 2015-12-19 DIAGNOSIS — J45901 Unspecified asthma with (acute) exacerbation: Secondary | ICD-10-CM | POA: Insufficient documentation

## 2015-12-19 DIAGNOSIS — R05 Cough: Secondary | ICD-10-CM

## 2015-12-19 DIAGNOSIS — J440 Chronic obstructive pulmonary disease with acute lower respiratory infection: Secondary | ICD-10-CM | POA: Diagnosis not present

## 2015-12-19 LAB — COMPREHENSIVE METABOLIC PANEL
ALBUMIN: 3.3 g/dL — AB (ref 3.5–5.0)
ALT: 31 U/L (ref 17–63)
AST: 26 U/L (ref 15–41)
Alkaline Phosphatase: 66 U/L (ref 38–126)
Anion gap: 7 (ref 5–15)
BUN: 24 mg/dL — AB (ref 6–20)
CHLORIDE: 104 mmol/L (ref 101–111)
CO2: 24 mmol/L (ref 22–32)
Calcium: 8.9 mg/dL (ref 8.9–10.3)
Creatinine, Ser: 1.19 mg/dL (ref 0.61–1.24)
GFR calc Af Amer: >60 mL/min (ref >60)
GFR calc non Af Amer: >60 mL/min (ref >60)
GLUCOSE: 104 mg/dL — AB (ref 65–99)
POTASSIUM: 4.2 mmol/L (ref 3.5–5.1)
SODIUM: 135 mmol/L (ref 135–145)
Total Bilirubin: 0.3 mg/dL (ref 0.3–1.2)
Total Protein: 5.8 g/dL — ABNORMAL LOW (ref 6.5–8.1)

## 2015-12-19 LAB — CBC WITH DIFFERENTIAL/PLATELET
Basophils Absolute: 0 10*3/uL (ref 0.0–0.1)
Basophils Relative: 0 %
EOS PCT: 15 %
Eosinophils Absolute: 0.5 10*3/uL (ref 0.0–0.7)
HCT: 29.4 % — ABNORMAL LOW (ref 39.0–52.0)
Hemoglobin: 10.2 g/dL — ABNORMAL LOW (ref 13.0–17.0)
LYMPHS ABS: 0.4 10*3/uL — AB (ref 0.7–4.0)
Lymphocytes Relative: 14 %
MCH: 33.1 pg (ref 26.0–34.0)
MCHC: 34.7 g/dL (ref 30.0–36.0)
MCV: 95.5 fL (ref 78.0–100.0)
MONO ABS: 0.5 10*3/uL (ref 0.1–1.0)
Monocytes Relative: 16 %
Neutro Abs: 1.7 10*3/uL (ref 1.7–7.7)
Neutrophils Relative %: 55 %
PLATELETS: 50 10*3/uL — AB (ref 150–400)
RBC: 3.08 MIL/uL — AB (ref 4.22–5.81)
RDW: 18 % — ABNORMAL HIGH (ref 11.5–15.5)
WBC: 3.2 10*3/uL — AB (ref 4.0–10.5)

## 2015-12-19 LAB — BRAIN NATRIURETIC PEPTIDE: B Natriuretic Peptide: 103.5 pg/mL — ABNORMAL HIGH (ref 0.0–100.0)

## 2015-12-19 MED ORDER — SODIUM CHLORIDE 0.9 % IV BOLUS (SEPSIS)
1000.0000 mL | Freq: Once | INTRAVENOUS | Status: AC
  Administered 2015-12-19: 1000 mL via INTRAVENOUS

## 2015-12-19 MED ORDER — ALBUTEROL SULFATE HFA 108 (90 BASE) MCG/ACT IN AERS
2.0000 | INHALATION_SPRAY | Freq: Once | RESPIRATORY_TRACT | Status: AC
  Administered 2015-12-20: 2 via RESPIRATORY_TRACT
  Filled 2015-12-19: qty 6.7

## 2015-12-19 MED ORDER — ALBUTEROL SULFATE (2.5 MG/3ML) 0.083% IN NEBU
5.0000 mg | INHALATION_SOLUTION | Freq: Once | RESPIRATORY_TRACT | Status: AC
  Administered 2015-12-19: 5 mg via RESPIRATORY_TRACT
  Filled 2015-12-19: qty 6

## 2015-12-19 MED ORDER — LEVOFLOXACIN 750 MG PO TABS
750.0000 mg | ORAL_TABLET | Freq: Once | ORAL | Status: AC
  Administered 2015-12-20: 750 mg via ORAL
  Filled 2015-12-19: qty 1

## 2015-12-19 MED ORDER — AEROCHAMBER PLUS W/MASK MISC
1.0000 | Freq: Once | Status: AC
  Administered 2015-12-20: 1
  Filled 2015-12-19: qty 1

## 2015-12-19 MED ORDER — ALUM & MAG HYDROXIDE-SIMETH 200-200-20 MG/5ML PO SUSP
30.0000 mL | Freq: Once | ORAL | Status: AC
  Administered 2015-12-19: 30 mL via ORAL
  Filled 2015-12-19: qty 30

## 2015-12-19 NOTE — ED Provider Notes (Addendum)
CSN: [id number]     Arrival date & time 12/19/15  2024 History   First MD Initiated Contact with Patient 12/19/15 2053     Chief Complaint  Patient presents with  . Shortness of Breath  . Chemotherapy     (Consider location/radiation/quality/duration/timing/severity/associated sxs/prior Treatment) HPI   Patient is a 65 year old male with multiple myeloma undergoing chemotherapy. Patient presents today with cold-like symptoms. They started on Friday that become progressive and worse today felt a cough and some wheezing at home. Patient's had no fever.  Patient received a breathing treatment at triage.  Patient reports on Friday developed some cold-like symptoms and then Saturday it became increasingly cough-like until today.   Past Medical History  Diagnosis Date  . Hypertension   . Hyperlipidemia   . Duodenal ulcer   . Allergy   . Thyroid disease   . GERD (gastroesophageal reflux disease)   . Depression   . Anemia   . Hypothyroidism   . Asthma     no treatment x 20 years  . Arthritis   . Double vision     occurs at times   . Multiple myeloma 07/04/2011   Past Surgical History  Procedure Laterality Date  . Cardiolite study  11/25/2003    NORMAL  . Bone marrow transplant  2011    for MM  . Tonsillectomy     Family History  Problem Relation Age of Onset  . Throat cancer Mother   . Hypertension Father   . Stroke Father   . Asthma Father   . Diabetes Father   . Pancreatic cancer Brother    Social History  Substance Use Topics  . Smoking status: Former Smoker -- 3 years    Quit date: 03/27/1969  . Smokeless tobacco: Never Used  . Alcohol Use: No    Review of Systems  Constitutional: Positive for fatigue. Negative for fever and activity change.  HENT: Positive for congestion.   Respiratory: Positive for cough and shortness of breath.   Cardiovascular: Negative for chest pain.  Gastrointestinal: Negative for abdominal pain.  Musculoskeletal: Negative for back  pain.  Psychiatric/Behavioral: Negative for agitation.      Allergies  Atorvastatin; Rosuvastatin; Crestor; and Lipitor  Home Medications   Prior to Admission medications   Medication Sig Start Date End Date Taking? Authorizing Provider  aspirin 325 MG tablet Take 325 mg by mouth daily.   Yes Historical Provider, MD  Bisacodyl (DULCOLAX PO) Take 1 tablet by mouth daily as needed (for constipation). Reported on 12/07/2015   Yes Historical Provider, MD  Bortezomib (VELCADE IJ) Inject 1 Dose as directed 2 (two) times a week.   Yes Historical Provider, MD  cetirizine (ZYRTEC) 10 MG tablet Take 10 mg by mouth daily.   Yes Historical Provider, MD  cholecalciferol (VITAMIN D) 1000 UNITS tablet Take 1 tablet (1,000 Units total) by mouth daily. 03/09/15  Yes Aleksei [name] V, MD  dexamethasone (DECADRON) 6 MG tablet Take 3 tablets by mouth with food as directed 10/15/15  Yes Chauncey Cruel, MD  escitalopram (LEXAPRO) 10 MG tablet Take 1 tablet (10 mg total) by mouth daily. 10/07/15  Yes Aleksei [name] V, MD  ketoconazole (NIZORAL) 2 % cream Apply 1 application topically daily. 10/29/15  Yes Chauncey Cruel, MD  levothyroxine (SYNTHROID, LEVOTHROID) 150 MCG tablet Take 1 tablet by mouth  daily 07/08/15  Yes Aleksei [name] V, MD  metoCLOPramide (REGLAN) 5 MG tablet Take 1 tablet (5 mg total) by mouth every 6 (  six) hours as needed for nausea. 11/23/15  Yes Chauncey Cruel, MD  pantoprazole (PROTONIX) 40 MG tablet Take 1 tablet (40 mg total) by mouth daily. 12/14/15  Yes Chauncey Cruel, MD  polyethylene glycol (MIRALAX / GLYCOLAX) packet Take 17 g by mouth daily.    Yes Historical Provider, MD  pomalidomide (POMALYST) 4 MG capsule Take 1 capsule by mouth with water daily on days 1-14. Repeat every 21 days. 12/02/15  Yes Nicholas Lose, MD  senna (SENOKOT) 8.6 MG tablet Take 1 tablet by mouth 2 (two) times daily. Reported on 12/07/2015   Yes Historical Provider, MD  valACYclovir (VALTREX) 1000 MG  tablet Take 1 tablet (1,000 mg total) by mouth daily. 10/22/15  Yes Chauncey Cruel, MD  zolpidem (AMBIEN) 5 MG tablet Take 1-2 tablets (5-10 mg total) by mouth at bedtime as needed for sleep. 10/22/15  Yes Chauncey Cruel, MD  levofloxacin (LEVAQUIN) 500 MG tablet Take 1 tablet (500 mg total) by mouth daily. 12/20/15 12/20/15  Rhonna Holster Lyn Estiven Kohan, MD   BP 109/74 mmHg  Pulse 77  Temp(Src) 97.5 F (36.4 C) (Oral)  Resp 16  SpO2 100% Physical Exam  Constitutional: He is oriented to person, place, and time. He appears well-nourished.  HENT:  Head: Normocephalic.  Mouth/Throat: Oropharynx is clear and moist.  Moist mucous membranes.  Eyes: Conjunctivae are normal.  Neck: No tracheal deviation present.  Cardiovascular: Normal rate.   Pulmonary/Chest: Effort normal. No stridor. No respiratory distress.  Very faint respiratory wheeze subtle at the right lung base.  Abdominal: Soft. There is no tenderness. There is no guarding.  Bruising on abdomen.  Musculoskeletal: Normal range of motion. He exhibits no edema.  Neurological: He is oriented to person, place, and time.  Skin: Skin is warm and dry. No rash noted. He is not diaphoretic.  Psychiatric: He has a normal mood and affect. His behavior is normal.  Nursing note and vitals reviewed.   ED Course  Procedures (including critical care time) Labs Review Labs Reviewed  COMPREHENSIVE METABOLIC PANEL - Abnormal; Notable for the following:    Glucose, Bld 104 (*)    BUN 24 (*)    Total Protein 5.8 (*)    Albumin 3.3 (*)    All other components within normal limits  CBC WITH DIFFERENTIAL/PLATELET - Abnormal; Notable for the following:    WBC 3.2 (*)    RBC 3.08 (*)    Hemoglobin 10.2 (*)    HCT 29.4 (*)    RDW 18.0 (*)    Platelets 50 (*)    Lymphs Abs 0.4 (*)    All other components within normal limits  BRAIN NATRIURETIC PEPTIDE - Abnormal; Notable for the following:    Bralen Natriuretic Peptide 103.5 (*)    All other components  within normal limits  INFLUENZA PANEL BY PCR (TYPE A & Jacori, H1N1)    Imaging Review Dg Chest 2 View  12/19/2015  CLINICAL DATA:  Shortness of breath and cold symptoms, initial encounter EXAM: CHEST  2 VIEW COMPARISON:  07/08/2015 FINDINGS: Cardiac shadow is within normal limits. The lungs are clear bilaterally. Bony structures are within normal limits. IMPRESSION: No active cardiopulmonary disease. Electronically Signed   By: Inez Catalina M.D.   On: 12/19/2015 21:11   I have personally reviewed and evaluated these images and lab results as part of my medical decision-making.   EKG Interpretation None      MDM   Final diagnoses:  Cough   Patient's  very pleasant 64 year old male undergoing chemotherapy for multiple myeloma. Patient presents with 3 days of congestion and one day of cough and wheezing. No fever. The patient did receive his flu shot this year.  We'll get chest x-ray. The flu pending.  We'll consider treating for pneumonia based on clinical symptoms. We'll touch base with on-call physician for DR. Magrinot.  No tachycardia, do not suspect PE given the viral symptoms.   12:03 AM Discussed with oncology. We will treat for pneumonia, given albuterol for wheezing as needed at home. Strict return cautioned expressed. Patient oxygenating normally with normal vital signs stable so will try outpatient treatment   Kaveri Perras Julio Alm, MD 12/19/15 2348  Terrian Ridlon Lyn Thomasene Lot, MD 12/20/15 0003

## 2015-12-19 NOTE — ED Notes (Signed)
Pt on chemo.  States that he has had cough and SOB.  No fevers at home.  No NVD.  Denies pain.  Was sent here by on call nurse.

## 2015-12-20 ENCOUNTER — Encounter: Payer: Self-pay | Admitting: Nurse Practitioner

## 2015-12-20 ENCOUNTER — Other Ambulatory Visit: Payer: Self-pay | Admitting: Oncology

## 2015-12-20 ENCOUNTER — Ambulatory Visit (HOSPITAL_BASED_OUTPATIENT_CLINIC_OR_DEPARTMENT_OTHER): Payer: Commercial Managed Care - PPO | Admitting: Nurse Practitioner

## 2015-12-20 VITALS — BP 123/65 | HR 67 | Temp 97.5°F | Resp 18 | Wt 192.8 lb

## 2015-12-20 DIAGNOSIS — J219 Acute bronchiolitis, unspecified: Secondary | ICD-10-CM | POA: Diagnosis not present

## 2015-12-20 DIAGNOSIS — C9002 Multiple myeloma in relapse: Secondary | ICD-10-CM

## 2015-12-20 LAB — INFLUENZA PANEL BY PCR (TYPE A & B)
H1N1FLUPCR: NOT DETECTED
INFLBPCR: NEGATIVE
Influenza A By PCR: NEGATIVE

## 2015-12-20 MED ORDER — LEVOFLOXACIN 500 MG PO TABS: 500.0000 mg | ORAL_TABLET | Freq: Every day | ORAL | Status: AC

## 2015-12-20 NOTE — Discharge Instructions (Signed)
We are treating you for pneumonia today. Please take antibiotics as prescribed. Please use albuterol as needed to help with any cough or wheezing you may have at home. Please return if you are having trouble breathing, fever, or other concerns.

## 2015-12-20 NOTE — Progress Notes (Signed)
ID: Andre [name]   DOB: 03/22/1951  MR#: [id number]  EHU#:[id number]   PCP: [name] SU: OTHER MD: [name], [name], [address] [place] [phone] [id number]), Ardine Bjork  CHIEF COMPLAINT: Multiple myeloma  CURRENT TREATMENT: Bortezomib, pomalidomide, dexamethasone  HISTORY OF PRESENT ILLNESS: From the original intake nodes:  The patient was worked up for peptic ulcer disease in August of 2011, with significant bleeding and anemia.  The patient was Helicobacter pylori negative.  He received some epinephrine when he had his EGD and then started on Protonix.  [name] anemia slowly resolved so that by September, his hemoglobin was up to 10.9 and by earlier this month, his hemoglobin was up to 12.5.    As part of his general workup, he was found to have a slightly elevated globulin fraction.  In September, his total protein was 8.3 with an albumin of 3.8.  In January, the total protein was 8.4 and albumin 3.6.  With persistence of this slight abnormality, Dr. Olevia Perches obtained serum immunofixation and SPEP.  The SPTP showed an M-spike of 2.67 grams.  A total IgG was 4,190.  Total IgA low at 28.  Total IgM low at 28 also.  The immunofixation showed a monoclonal IgG lambda paraprotein.  There were also monoclonal free lambda light chains present. With this information, the patient was referred for further evaluation.   A diagnosis of myeloma was confirmed by bone marrow biopsy and subsequebnt treatment is as detailed below  INTERVAL HISTORY: Andre [name] returns today for follow-up of his relapsed multiple myeloma, accompanied by his significant other, [name]. Today is day 14, cycle 3 of bortezomib which he receives on days 1, 4, 8 and 11 every 21 days.  He takes pomalidomide on days 1-14.Dexamethasone 46m is also taken twice weekly.   The interval history is remarkable for a trip to the ED yesterday. He presented with cough and wheezing, but no fever. He was  given an albuterol inhaler and a prescription for levofloxacin, after a dose of antibiotics and a breathing treatment in the treatment bay. The did not fill the prescription for the levofloxacin because he wanted to make sure that we agreed with this assessment.  REVIEW OF SYSTEMS: JJenny Reichmannfeels week. He slept poorly because of the cough. He forgot to use the inhaler last night, so he is not sure if it is helpful. He is coughing up a clear to yellow thick mucus occasionally. His wheeze is audible without a stethoscope, but he is not gasping for air. He takes zyrtec daily for his allergies.  He moves his bowels about every other day with some mild bloating, using miralax and dulcolax PRN. His appetite is fair. He has no pain beside mild twinges to his lower back. He denies mouth sores, rashes, or neuropathy symptoms.  A detailed review of systems is otherwise stable.  PAST MEDICAL HISTORY: Past Medical History  Diagnosis Date  . Hypertension   . Hyperlipidemia   . Duodenal ulcer   . Allergy   . Thyroid disease   . GERD (gastroesophageal reflux disease)   . Depression   . Anemia   . Hypothyroidism   . Asthma     no treatment x 20 years  . Arthritis   . Double vision     occurs at times   . Multiple myeloma 07/04/2011  Significant for peptic ulcer disease as noted above.  History of hyperlipidemia.  History of anxiety and depression.  History of GERD which is  significantly improved with weight loss and history of reactive airway disease, possibly secondary to the GERD which also improved with weight loss.  There was a history of obesity, now much improved secondary to exercise and diet.   PAST SURGICAL HISTORY: Past Surgical History  Procedure Laterality Date  . Cardiolite study  11/25/2003    NORMAL  . Bone marrow transplant  2011    for MM  . Tonsillectomy      FAMILY HISTORY Family History  Problem Relation Age of Onset  . Throat cancer Mother   . Hypertension Father   . Stroke  Father   . Asthma Father   . Diabetes Father   . Pancreatic cancer Brother   [name] father died at the age of 25 following a stroke.  [name] mother died with cancer of the throat at the age of 63.   She had a history of depression and was a smoker.  Not clear how much alcohol she drank, according to the patient.  The patient had two brothers.  One died from the age of 61 from a "kidney problem."  The second one died at the age of 84 from pancreatic cancer. (This may have been duodenal cancer. 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  His wife of 37+ years was [name], but they are now divorced. In 2016 he remarried, his current wife's name is [name].  The patient has two daughters:  Andre [name], who works in [place], who is expecting her second child April 2016. .  They both live here in [place]. 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  The patient is very close to his daughters.  Ruffins first daughter, Andre [name], born 03/06/2013, apparently has Weber/Osler/Rendu. The patient is not a church attender.  He does derive a great deal of support from friends at the "[employer]" which he attends very regularly, and also participates in Al-Anon even though actually there is no alcohol or drug problem in the family.  He participates in this group and he gets quite a bit of support from it.     ADVANCED DIRECTIVES: In place  HEALTH MAINTENANCE: (updated 07/16/2013) Social History  Substance Use Topics  . Smoking status: Former Smoker -- 3 years    Quit date: 03/27/1969  . Smokeless tobacco: Never Used  . Alcohol Use: No     Colonoscopy:  PSA:  Sept 2014, normal/Dr. [name]  Lipid panel: Sept 2014/Dr. [name]   Allergies  Allergen Reactions  . Atorvastatin Other (See  Comments)  . Rosuvastatin     Other reaction(s): Liver Disorder  . Crestor [Rosuvastatin Calcium]     ADVERSE EFFECTS ON LIVER  . Lipitor [Atorvastatin Calcium]     Current Outpatient Prescriptions  Medication Sig Dispense Refill  . aspirin 325 MG tablet Take 325 mg by mouth daily.    . Bisacodyl (DULCOLAX PO) Take 1 tablet by mouth daily as needed (for constipation). Reported on 12/07/2015    . Bortezomib (VELCADE IJ) Inject 1 Dose as directed 2 (two) times a week.    . cetirizine (ZYRTEC) 10 MG tablet Take 10 mg by mouth daily.    . cholecalciferol (VITAMIN D) 1000 UNITS tablet Take 1 tablet (1,000 Units total) by mouth daily. 100 tablet 3  . escitalopram (  LEXAPRO) 10 MG tablet Take 1 tablet (10 mg total) by mouth daily. 30 tablet 5  . levofloxacin (LEVAQUIN) 500 MG tablet Take 1 tablet (500 mg total) by mouth daily. 10 tablet 0  . levothyroxine (SYNTHROID, LEVOTHROID) 150 MCG tablet Take 1 tablet by mouth  daily 90 tablet 3  . metoCLOPramide (REGLAN) 5 MG tablet Take 1 tablet (5 mg total) by mouth every 6 (six) hours as needed for nausea. 90 tablet 1  . pantoprazole (PROTONIX) 40 MG tablet Take 1 tablet (40 mg total) by mouth daily. 30 tablet 0  . polyethylene glycol (MIRALAX / GLYCOLAX) packet Take 17 g by mouth daily.     . pomalidomide (POMALYST) 4 MG capsule Take 1 capsule by mouth with water daily on days 1-14. Repeat every 21 days. 14 capsule 4  . senna (SENOKOT) 8.6 MG tablet Take 1 tablet by mouth 2 (two) times daily. Reported on 12/07/2015    . valACYclovir (VALTREX) 1000 MG tablet Take 1 tablet (1,000 mg total) by mouth daily. 60 tablet 6  . zolpidem (AMBIEN) 5 MG tablet Take 1-2 tablets (5-10 mg total) by mouth at bedtime as needed for sleep. 30 tablet 1  . dexamethasone (DECADRON) 6 MG tablet Take 3 tablets by mouth with food as directed (Patient not taking: Reported on 12/20/2015) 24 tablet 4  . ketoconazole (NIZORAL) 2 % cream Apply 1 application topically daily. (Patient not  taking: Reported on 12/20/2015) 15 g 0  . [DISCONTINUED] fexofenadine (ALLEGRA) 60 MG tablet Take 180 mg by mouth daily.       No current facility-administered medications for this visit.    OBJECTIVE: Middle-aged white ma iIn no acute distress Filed Vitals:   12/20/15 1415  BP: 123/65  Pulse: 67  Temp: 97.5 F (36.4 C)  Resp: 18   Body mass index is 27.66 kg/(m^2).  Filed Weights   12/20/15 1415  Weight: 192 lb 12.8 oz (87.454 kg)   Skin: warm, dry  HEENT: sclerae anicteric, conjunctivae pink, oropharynx clear. No thrush or mucositis.  Lymph Nodes: No cervical or supraclavicular lymphadenopathy  Lungs: audible wheezing at the beginning of our visit, but lungs are now clear to auscultation bilaterally Heart: regular rate and rhythm  Abdomen: round, soft, non tender, positive bowel sounds  Musculoskeletal: No focal spinal tenderness, no peripheral edema  Neuro: non focal, well oriented, positive affect   LAB RESULTS:  Lab Results  Component Value Date   WBC 3.2* 12/19/2015   NEUTROABS 1.7 12/19/2015   HGB 10.2* 12/19/2015   HCT 29.4* 12/19/2015   MCV 95.5 12/19/2015   PLT 50* 12/19/2015      Chemistry      Component Value Date/Time   NA 135 12/19/2015 2143   NA 138 12/10/2015 0841   K 4.2 12/19/2015 2143   K 4.6 12/10/2015 0841   CL 104 12/19/2015 2143   CL 107 03/10/2013 1352   CO2 24 12/19/2015 2143   CO2 22 12/10/2015 0841   BUN 24* 12/19/2015 2143   BUN 20.7 12/10/2015 0841   CREATININE 1.19 12/19/2015 2143   CREATININE 1.2 12/10/2015 0841      Component Value Date/Time   CALCIUM 8.9 12/19/2015 2143   CALCIUM 9.2 12/10/2015 0841   ALKPHOS 66 12/19/2015 2143   ALKPHOS 72 12/10/2015 0841   AST 26 12/19/2015 2143   AST 17 12/10/2015 0841   ALT 31 12/19/2015 2143   ALT 21 12/10/2015 0841   BILITOT 0.3 12/19/2015 2143   BILITOT 0.32  12/10/2015 0841      STUDIES: Dg Chest 2 View  12/19/2015  CLINICAL DATA:  Shortness of breath and cold symptoms,  initial encounter EXAM: CHEST  2 VIEW COMPARISON:  07/08/2015 FINDINGS: Cardiac shadow is within normal limits. The lungs are clear bilaterally. Bony structures are within normal limits. IMPRESSION: No active cardiopulmonary disease. Electronically Signed   By: Inez Catalina M.D.   On: 12/19/2015 21:11   ASSESSMENT: 65 y.o.  [place] man with a history of multiple myeloma diagnosed February of 2012, with an initial M-spike of 2.6 g/dL, IFE showing an IgG lambda paraprotein and free lambda light chains in the urine. Cytogenetics showed trisomy 76. Bone marrow biopsy showed a 22% plasmacytosis. Treated with   (1) bortezomib (subcutaneously), lenalidomide, and dexamethasone, with repeat bone marrow biopsy May of 2012 showing 10% plasmacytosis   (2) high-dose chemotherapy with BCNU and melphalan at [facility], followed by stem cell rescue July of 2012   (3) on zoledronic acid started December of 2012, initially monthly, currently given every 3 months, most recent dose 02/17/2014  (4) low-dose lenalidomide resumed April 2013, interrupted several times since.  Resumed again on 02/19/2013, and a dose of 5 mg daily, 21 days on and 7 days off, later further reduced to 7 days on, 7 days off  (5) CNS symptoms and abnormal brain MRI extensively evaluated by neurology with no definitive diagnosis established; improving   (6) rising M spike noted June 2014 but did not meet criteria for carfilzomib study  (7) on lenalidomide 25 mg daily, 14 days on, 7 days off, starting 04/18/2013, interrupted December 2014 because of rash;   (a) resumed January 2015 at 10 mg/ day at 21 days on/ 7 days off  (Demetrus) starting 08/18/2014 decreasedto 10 mg/ day 14 days on, 7 days off because of cytopenias  (c) as of February 2016 he has been on 5 mg daily 7 days on 7 days off  (d) lenalidomide discontinued December 2016 with evidence of disease progression  (8) transient global amnesia 05/29/2015, resolved without intervention  (9)  starting PVD 10/25/2015 w ASA 325 thromboprophylaxis, valacyclovir prophylaxis  (a) pomalidomide 4 mg/d days 1-14  (Angelo) bortezomib sQ days 2,5,9,12 of each 21 day cycle  (c) dexamethasome 20 mg two days a week  PLAN:  [name]'s lungs are clear on auscultation as well as his chest xray. The flu swab returned negative. At this point I believe he is dealing with acute bronchitis. I have advised him to go ahead and fill the prescription for levofloxacin and continue with his inhaler every 4-6 hours. If he were any other patient, I'd put him on a steroid taper. Because he is already on high dose dexamethasone, he will continue with the 49m twice weekly dosing he has been doing.   He was advised to proceed immediately to the ED with fever,chills, increased shortness of breath, or pleurisy.  JJenny Reichmannwill return next week for cycle 4 of treatment and follow up with Dr. MJana Hakim He understands and agrees with this plan. He has been encouraged to call with any issues that might arise before his next visit here.   HLaurie Panda NP 12/20/2015

## 2015-12-22 ENCOUNTER — Telehealth: Payer: Self-pay | Admitting: Nurse Practitioner

## 2015-12-22 ENCOUNTER — Encounter: Payer: Self-pay | Admitting: Nurse Practitioner

## 2015-12-22 ENCOUNTER — Ambulatory Visit (HOSPITAL_BASED_OUTPATIENT_CLINIC_OR_DEPARTMENT_OTHER): Payer: Commercial Managed Care - PPO

## 2015-12-22 ENCOUNTER — Other Ambulatory Visit: Payer: Self-pay

## 2015-12-22 ENCOUNTER — Other Ambulatory Visit: Payer: Commercial Managed Care - PPO

## 2015-12-22 ENCOUNTER — Ambulatory Visit (HOSPITAL_BASED_OUTPATIENT_CLINIC_OR_DEPARTMENT_OTHER): Payer: Commercial Managed Care - PPO | Admitting: Nurse Practitioner

## 2015-12-22 ENCOUNTER — Emergency Department (HOSPITAL_COMMUNITY): Payer: Commercial Managed Care - PPO

## 2015-12-22 ENCOUNTER — Encounter (HOSPITAL_COMMUNITY): Payer: Self-pay | Admitting: Internal Medicine

## 2015-12-22 ENCOUNTER — Inpatient Hospital Stay (HOSPITAL_COMMUNITY)
Admission: EM | Admit: 2015-12-22 | Discharge: 2015-12-26 | DRG: 190 | Disposition: A | Discharge status: Home / Self Care | Payer: Commercial Managed Care - PPO | Attending: Internal Medicine | Admitting: Internal Medicine

## 2015-12-22 ENCOUNTER — Telehealth: Payer: Self-pay | Admitting: *Deleted

## 2015-12-22 ENCOUNTER — Encounter (HOSPITAL_COMMUNITY): Payer: Self-pay | Admitting: Emergency Medicine

## 2015-12-22 VITALS — BP 148/86 | HR 83 | Temp 99.1°F | Resp 17 | Ht 70.0 in

## 2015-12-22 DIAGNOSIS — E785 Hyperlipidemia, unspecified: Secondary | ICD-10-CM | POA: Diagnosis present

## 2015-12-22 DIAGNOSIS — Z8249 Family history of ischemic heart disease and other diseases of the circulatory system: Secondary | ICD-10-CM

## 2015-12-22 DIAGNOSIS — Y95 Nosocomial condition: Secondary | ICD-10-CM | POA: Diagnosis present

## 2015-12-22 DIAGNOSIS — M1990 Unspecified osteoarthritis, unspecified site: Secondary | ICD-10-CM | POA: Diagnosis present

## 2015-12-22 DIAGNOSIS — R531 Weakness: Secondary | ICD-10-CM

## 2015-12-22 DIAGNOSIS — E079 Disorder of thyroid, unspecified: Secondary | ICD-10-CM | POA: Diagnosis present

## 2015-12-22 DIAGNOSIS — J45901 Unspecified asthma with (acute) exacerbation: Secondary | ICD-10-CM | POA: Diagnosis present

## 2015-12-22 DIAGNOSIS — D696 Thrombocytopenia, unspecified: Secondary | ICD-10-CM | POA: Diagnosis present

## 2015-12-22 DIAGNOSIS — R509 Fever, unspecified: Secondary | ICD-10-CM | POA: Diagnosis not present

## 2015-12-22 DIAGNOSIS — J189 Pneumonia, unspecified organism: Secondary | ICD-10-CM | POA: Diagnosis present

## 2015-12-22 DIAGNOSIS — E860 Dehydration: Secondary | ICD-10-CM | POA: Insufficient documentation

## 2015-12-22 DIAGNOSIS — D649 Anemia, unspecified: Secondary | ICD-10-CM | POA: Diagnosis present

## 2015-12-22 DIAGNOSIS — J441 Chronic obstructive pulmonary disease with (acute) exacerbation: Secondary | ICD-10-CM | POA: Diagnosis present

## 2015-12-22 DIAGNOSIS — R0602 Shortness of breath: Secondary | ICD-10-CM

## 2015-12-22 DIAGNOSIS — C9002 Multiple myeloma in relapse: Secondary | ICD-10-CM

## 2015-12-22 DIAGNOSIS — Z833 Family history of diabetes mellitus: Secondary | ICD-10-CM | POA: Diagnosis not present

## 2015-12-22 DIAGNOSIS — Z9481 Bone marrow transplant status: Secondary | ICD-10-CM | POA: Diagnosis not present

## 2015-12-22 DIAGNOSIS — I10 Essential (primary) hypertension: Secondary | ICD-10-CM | POA: Diagnosis present

## 2015-12-22 DIAGNOSIS — G9340 Encephalopathy, unspecified: Secondary | ICD-10-CM | POA: Diagnosis present

## 2015-12-22 DIAGNOSIS — Z87891 Personal history of nicotine dependence: Secondary | ICD-10-CM

## 2015-12-22 DIAGNOSIS — Q929 Trisomy and partial trisomy of autosomes, unspecified: Secondary | ICD-10-CM | POA: Diagnosis not present

## 2015-12-22 DIAGNOSIS — A419 Sepsis, unspecified organism: Secondary | ICD-10-CM

## 2015-12-22 DIAGNOSIS — J9601 Acute respiratory failure with hypoxia: Secondary | ICD-10-CM | POA: Diagnosis present

## 2015-12-22 DIAGNOSIS — E871 Hypo-osmolality and hyponatremia: Secondary | ICD-10-CM | POA: Diagnosis present

## 2015-12-22 DIAGNOSIS — J440 Chronic obstructive pulmonary disease with acute lower respiratory infection: Secondary | ICD-10-CM | POA: Diagnosis present

## 2015-12-22 DIAGNOSIS — C9000 Multiple myeloma not having achieved remission: Secondary | ICD-10-CM

## 2015-12-22 DIAGNOSIS — Z8711 Personal history of peptic ulcer disease: Secondary | ICD-10-CM

## 2015-12-22 DIAGNOSIS — Z7982 Long term (current) use of aspirin: Secondary | ICD-10-CM | POA: Diagnosis not present

## 2015-12-22 DIAGNOSIS — Z66 Do not resuscitate: Secondary | ICD-10-CM | POA: Diagnosis present

## 2015-12-22 DIAGNOSIS — R062 Wheezing: Secondary | ICD-10-CM | POA: Diagnosis not present

## 2015-12-22 DIAGNOSIS — Q349 Congenital malformation of respiratory system, unspecified: Secondary | ICD-10-CM

## 2015-12-22 DIAGNOSIS — K219 Gastro-esophageal reflux disease without esophagitis: Secondary | ICD-10-CM | POA: Diagnosis present

## 2015-12-22 DIAGNOSIS — E039 Hypothyroidism, unspecified: Secondary | ICD-10-CM | POA: Diagnosis present

## 2015-12-22 DIAGNOSIS — R05 Cough: Secondary | ICD-10-CM | POA: Diagnosis not present

## 2015-12-22 DIAGNOSIS — F329 Major depressive disorder, single episode, unspecified: Secondary | ICD-10-CM | POA: Diagnosis present

## 2015-12-22 DIAGNOSIS — D61818 Other pancytopenia: Secondary | ICD-10-CM | POA: Diagnosis present

## 2015-12-22 DIAGNOSIS — H532 Diplopia: Secondary | ICD-10-CM | POA: Diagnosis present

## 2015-12-22 LAB — CBC WITH DIFFERENTIAL/PLATELET
BASO%: 0.4 % (ref 0.0–2.0)
BASOS ABS: 0 10*3/uL (ref 0.0–0.1)
Basophils Absolute: 0 10*3/uL (ref 0.0–0.1)
Basophils Relative: 1 %
EOS ABS: 0 10*3/uL (ref 0.0–0.7)
EOS%: 0 % (ref 0.0–7.0)
Eosinophils Absolute: 0 10*3/uL (ref 0.0–0.5)
Eosinophils Relative: 1 %
HEMATOCRIT: 28.8 % — AB (ref 38.4–49.9)
HEMATOCRIT: 26.3 % — AB (ref 39.0–52.0)
HEMOGLOBIN: 10 g/dL — AB (ref 13.0–17.1)
HEMOGLOBIN: 9.1 g/dL — AB (ref 13.0–17.0)
LYMPH#: 0.3 10*3/uL — AB (ref 0.9–3.3)
LYMPH%: 12.9 % — ABNORMAL LOW (ref 14.0–49.0)
LYMPHS PCT: 15 %
Lymphs Abs: 0.3 10*3/uL — ABNORMAL LOW (ref 0.7–4.0)
MCH: 32.9 pg (ref 27.2–33.4)
MCH: 32.7 pg (ref 26.0–34.0)
MCHC: 34.7 g/dL (ref 32.0–36.0)
MCHC: 34.6 g/dL (ref 30.0–36.0)
MCV: 94.7 fL (ref 79.3–98.0)
MCV: 94.6 fL (ref 78.0–100.0)
MONO#: 0.4 10*3/uL (ref 0.1–0.9)
MONO%: 17 % — AB (ref 0.0–14.0)
MONOS PCT: 12 %
Monocytes Absolute: 0.2 10*3/uL (ref 0.1–1.0)
NEUT%: 69.7 % (ref 39.0–75.0)
NEUTROS ABS: 1.7 10*3/uL (ref 1.5–6.5)
NEUTROS ABS: 1.5 10*3/uL — AB (ref 1.7–7.7)
NEUTROS PCT: 71 %
Platelets: 39 10*3/uL — ABNORMAL LOW (ref 140–400)
Platelets: 38 10*3/uL — ABNORMAL LOW (ref 150–400)
RBC: 3.04 10*6/uL — ABNORMAL LOW (ref 4.20–5.82)
RBC: 2.78 MIL/uL — ABNORMAL LOW (ref 4.22–5.81)
RDW: 18.1 % — AB (ref 11.0–14.6)
RDW: 17.9 % — ABNORMAL HIGH (ref 11.5–15.5)
WBC: 2.4 10*3/uL — AB (ref 4.0–10.3)
WBC: 2 10*3/uL — ABNORMAL LOW (ref 4.0–10.5)

## 2015-12-22 LAB — COMPREHENSIVE METABOLIC PANEL
ALBUMIN: 3.2 g/dL — AB (ref 3.5–5.0)
ALBUMIN: 3.1 g/dL — AB (ref 3.5–5.0)
ALK PHOS: 77 U/L (ref 40–150)
ALK PHOS: 63 U/L (ref 38–126)
ALT: 24 U/L (ref 0–55)
ALT: 24 U/L (ref 17–63)
ANION GAP: 7 (ref 5–15)
AST: 22 U/L (ref 5–34)
AST: 25 U/L (ref 15–41)
Anion Gap: 7 mEq/L (ref 3–11)
BILIRUBIN TOTAL: 0.6 mg/dL (ref 0.3–1.2)
BUN: 16 mg/dL (ref 7.0–26.0)
BUN: 17 mg/dL (ref 6–20)
CALCIUM: 7.6 mg/dL — AB (ref 8.9–10.3)
CO2: 23 mEq/L (ref 22–29)
CO2: 18 mmol/L — AB (ref 22–32)
CREATININE: 1.2 mg/dL (ref 0.7–1.3)
CREATININE: 1.05 mg/dL (ref 0.61–1.24)
Calcium: 8.8 mg/dL (ref 8.4–10.4)
Chloride: 100 mEq/L (ref 98–109)
Chloride: 101 mmol/L (ref 101–111)
EGFR: 66 mL/min/{1.73_m2} — ABNORMAL LOW (ref >90)
GFR calc Af Amer: >60 mL/min (ref >60)
GFR calc non Af Amer: >60 mL/min (ref >60)
GLUCOSE: 91 mg/dL (ref 70–140)
GLUCOSE: 123 mg/dL — AB (ref 65–99)
POTASSIUM: 4.1 meq/L (ref 3.5–5.1)
Potassium: 3.5 mmol/L (ref 3.5–5.1)
SODIUM: 130 meq/L — AB (ref 136–145)
SODIUM: 126 mmol/L — AB (ref 135–145)
TOTAL PROTEIN: 6 g/dL — AB (ref 6.5–8.1)
Total Bilirubin: 0.36 mg/dL (ref 0.20–1.20)
Total Protein: 6.6 g/dL (ref 6.4–8.3)

## 2015-12-22 LAB — LACTIC ACID, PLASMA: LACTIC ACID, VENOUS: 1.1 mmol/L (ref 0.5–2.0)

## 2015-12-22 LAB — I-STAT CG4 LACTIC ACID, ED: Lactic Acid, Venous: 1.12 mmol/L (ref 0.5–2.0)

## 2015-12-22 LAB — I-STAT TROPONIN, ED: Troponin i, poc: 0.01 ng/mL (ref 0.00–0.08)

## 2015-12-22 MED ORDER — SODIUM CHLORIDE 0.9 % IV BOLUS (SEPSIS)
30.0000 mL/kg | Freq: Once | INTRAVENOUS | Status: AC
  Administered 2015-12-22: 2625 mL via INTRAVENOUS

## 2015-12-22 MED ORDER — LORATADINE 10 MG PO TABS
10.0000 mg | ORAL_TABLET | Freq: Every day | ORAL | Status: DC
  Administered 2015-12-23 – 2015-12-26 (×4): 10 mg via ORAL
  Filled 2015-12-22 (×4): qty 1

## 2015-12-22 MED ORDER — SODIUM CHLORIDE 0.9% FLUSH
3.0000 mL | Freq: Two times a day (BID) | INTRAVENOUS | Status: DC
  Administered 2015-12-22 – 2015-12-26 (×5): 3 mL via INTRAVENOUS

## 2015-12-22 MED ORDER — PIPERACILLIN-TAZOBACTAM 3.375 G IVPB
3.3750 g | Freq: Three times a day (TID) | INTRAVENOUS | Status: DC
  Administered 2015-12-22 – 2015-12-26 (×11): 3.375 g via INTRAVENOUS
  Filled 2015-12-22 (×10): qty 50

## 2015-12-22 MED ORDER — PREDNISONE 20 MG PO TABS
40.0000 mg | ORAL_TABLET | Freq: Every day | ORAL | Status: DC
  Administered 2015-12-23: 40 mg via ORAL
  Filled 2015-12-22: qty 2

## 2015-12-22 MED ORDER — SODIUM CHLORIDE 0.9 % IV BOLUS (SEPSIS)
500.0000 mL | Freq: Once | INTRAVENOUS | Status: AC
  Administered 2015-12-22: 500 mL via INTRAVENOUS

## 2015-12-22 MED ORDER — ASPIRIN 325 MG PO TABS
325.0000 mg | ORAL_TABLET | Freq: Every day | ORAL | Status: DC
  Administered 2015-12-23 – 2015-12-26 (×4): 325 mg via ORAL
  Filled 2015-12-22 (×4): qty 1

## 2015-12-22 MED ORDER — IPRATROPIUM-ALBUTEROL 0.5-2.5 (3) MG/3ML IN SOLN
3.0000 mL | Freq: Once | RESPIRATORY_TRACT | Status: AC
  Administered 2015-12-22: 3 mL via RESPIRATORY_TRACT
  Filled 2015-12-22: qty 3

## 2015-12-22 MED ORDER — ESCITALOPRAM OXALATE 10 MG PO TABS
10.0000 mg | ORAL_TABLET | Freq: Every day | ORAL | Status: DC
  Administered 2015-12-23 – 2015-12-26 (×4): 10 mg via ORAL
  Filled 2015-12-22 (×4): qty 1

## 2015-12-22 MED ORDER — HYDROCODONE-ACETAMINOPHEN 5-325 MG PO TABS
1.0000 | ORAL_TABLET | ORAL | Status: DC | PRN
  Administered 2015-12-22: 1 via ORAL
  Administered 2015-12-25: 2 via ORAL
  Filled 2015-12-22: qty 2
  Filled 2015-12-22: qty 1

## 2015-12-22 MED ORDER — ONDANSETRON HCL 4 MG PO TABS: 4.0000 mg | ORAL_TABLET | Freq: Four times a day (QID) | ORAL | Status: DC | PRN

## 2015-12-22 MED ORDER — PIPERACILLIN-TAZOBACTAM 3.375 G IVPB 30 MIN
3.3750 g | Freq: Once | INTRAVENOUS | Status: AC
  Administered 2015-12-22: 3.375 g via INTRAVENOUS
  Filled 2015-12-22: qty 50

## 2015-12-22 MED ORDER — VANCOMYCIN HCL IN DEXTROSE 1-5 GM/200ML-% IV SOLN
1000.0000 mg | Freq: Once | INTRAVENOUS | Status: AC
  Administered 2015-12-22: 1000 mg via INTRAVENOUS
  Filled 2015-12-22: qty 200

## 2015-12-22 MED ORDER — POLYETHYLENE GLYCOL 3350 17 G PO PACK: 17.0000 g | PACK | Freq: Every day | ORAL | Status: DC | PRN

## 2015-12-22 MED ORDER — SENNA 8.6 MG PO TABS
1.0000 | ORAL_TABLET | Freq: Two times a day (BID) | ORAL | Status: DC
  Administered 2015-12-23 – 2015-12-26 (×6): 8.6 mg via ORAL
  Filled 2015-12-22 (×6): qty 1

## 2015-12-22 MED ORDER — LEVOTHYROXINE SODIUM 50 MCG PO TABS
150.0000 ug | ORAL_TABLET | Freq: Every day | ORAL | Status: DC
  Administered 2015-12-23 – 2015-12-26 (×4): 150 ug via ORAL
  Filled 2015-12-22 (×4): qty 1

## 2015-12-22 MED ORDER — ACETAMINOPHEN 325 MG PO TABS: 650.0000 mg | ORAL_TABLET | Freq: Four times a day (QID) | ORAL | Status: DC | PRN

## 2015-12-22 MED ORDER — AZTREONAM 2 G IJ SOLR: 2.0000 g | Freq: Once | INTRAMUSCULAR | Status: DC

## 2015-12-22 MED ORDER — PIPERACILLIN-TAZOBACTAM 3.375 G IVPB 30 MIN: 3.3750 g | Freq: Once | INTRAVENOUS | Status: DC

## 2015-12-22 MED ORDER — LORAZEPAM 1 MG PO TABS: 1.0000 mg | ORAL_TABLET | Freq: Once | ORAL | Status: DC

## 2015-12-22 MED ORDER — IPRATROPIUM BROMIDE 0.02 % IN SOLN
0.5000 mg | Freq: Four times a day (QID) | RESPIRATORY_TRACT | Status: DC
  Administered 2015-12-23: 0.5 mg via RESPIRATORY_TRACT
  Filled 2015-12-22: qty 2.5

## 2015-12-22 MED ORDER — VANCOMYCIN HCL IN DEXTROSE 1-5 GM/200ML-% IV SOLN
1000.0000 mg | Freq: Two times a day (BID) | INTRAVENOUS | Status: DC
  Administered 2015-12-23 – 2015-12-25 (×5): 1000 mg via INTRAVENOUS
  Filled 2015-12-22 (×5): qty 200

## 2015-12-22 MED ORDER — SODIUM CHLORIDE 0.9 % IV BOLUS (SEPSIS)
1000.0000 mL | Freq: Once | INTRAVENOUS | Status: AC
  Administered 2015-12-22: 1000 mL via INTRAVENOUS

## 2015-12-22 MED ORDER — ONDANSETRON HCL 4 MG/2ML IJ SOLN: 4.0000 mg | Freq: Four times a day (QID) | INTRAMUSCULAR | Status: DC | PRN

## 2015-12-22 MED ORDER — VALACYCLOVIR HCL 500 MG PO TABS
1000.0000 mg | ORAL_TABLET | Freq: Every day | ORAL | Status: DC
  Administered 2015-12-23 – 2015-12-26 (×4): 1000 mg via ORAL
  Filled 2015-12-22 (×4): qty 2

## 2015-12-22 MED ORDER — ACETAMINOPHEN 650 MG RE SUPP: 650.0000 mg | Freq: Four times a day (QID) | RECTAL | Status: DC | PRN

## 2015-12-22 MED ORDER — ZOLPIDEM TARTRATE 5 MG PO TABS
5.0000 mg | ORAL_TABLET | Freq: Every evening | ORAL | Status: DC | PRN
  Administered 2015-12-22 – 2015-12-25 (×4): 5 mg via ORAL
  Filled 2015-12-22 (×4): qty 1

## 2015-12-22 MED ORDER — GUAIFENESIN ER 600 MG PO TB12
600.0000 mg | ORAL_TABLET | Freq: Two times a day (BID) | ORAL | Status: DC
  Administered 2015-12-22 – 2015-12-26 (×8): 600 mg via ORAL
  Filled 2015-12-22 (×9): qty 1

## 2015-12-22 MED ORDER — METOCLOPRAMIDE HCL 10 MG PO TABS
5.0000 mg | ORAL_TABLET | Freq: Four times a day (QID) | ORAL | Status: DC | PRN
  Administered 2015-12-22: 5 mg via ORAL
  Filled 2015-12-22: qty 1

## 2015-12-22 MED ORDER — PANTOPRAZOLE SODIUM 40 MG IV SOLR
40.0000 mg | Freq: Once | INTRAVENOUS | Status: AC
  Administered 2015-12-22: 40 mg via INTRAVENOUS
  Filled 2015-12-22: qty 40

## 2015-12-22 MED ORDER — ALBUTEROL SULFATE (2.5 MG/3ML) 0.083% IN NEBU
2.5000 mg | INHALATION_SOLUTION | RESPIRATORY_TRACT | Status: DC | PRN
  Administered 2015-12-23: 2.5 mg via RESPIRATORY_TRACT
  Filled 2015-12-22: qty 3

## 2015-12-22 MED ORDER — VANCOMYCIN HCL IN DEXTROSE 1-5 GM/200ML-% IV SOLN: 1000.0000 mg | Freq: Once | INTRAVENOUS | Status: DC

## 2015-12-22 NOTE — ED Provider Notes (Signed)
CSN: [id number]     Arrival date & time 12/22/15  1706 History   First MD Initiated Contact with Patient 12/22/15 1709     Chief Complaint  Patient presents with  . Weakness     (Consider location/radiation/quality/duration/timing/severity/associated sxs/prior Treatment) HPI Patient is referred from oncology for fluids and antibiotics and hospital admission. He has been getting increasingly ill over the past 3-5 days. Patient was seen in the emergency department 3 days ago with upper respiratory symptoms and prescribed Levaquin per consultation with oncology and discharged to follow-up. He has followed up at his oncology office yesterday. He continues to be extremely weak with some mild confusion. He has not had any improvement since starting antibiotics. His MAXIMUM TEMPERATURE was 100 today. He has not had vomiting or diarrhea. He does identify some nausea and subsequently has described some burning sensation like reflux. There've been no rashes areas of redness or swelling. He has had cough that has been wet sounding but not productive. With decline general condition he has been advised to come to the emergency department. A is currently undergoing chemotherapy for multiple myeloma with ultimate plan for bone marrow transplant. Past Medical History  Diagnosis Date  . Hypertension   . Hyperlipidemia   . Duodenal ulcer   . Allergy   . Thyroid disease   . GERD (gastroesophageal reflux disease)   . Depression   . Anemia   . Hypothyroidism   . Asthma     no treatment x 20 years  . Arthritis   . Double vision     occurs at times   . Multiple myeloma 07/04/2011   Past Surgical History  Procedure Laterality Date  . Cardiolite study  11/25/2003    NORMAL  . Bone marrow transplant  2011    for MM  . Tonsillectomy     Family History  Problem Relation Age of Onset  . Throat cancer Mother   . Hypertension Father   . Stroke Father   . Asthma Father   . Diabetes Father   . Pancreatic  cancer Brother    Social History  Substance Use Topics  . Smoking status: Former Smoker -- 3 years    Quit date: 03/27/1969  . Smokeless tobacco: Never Used  . Alcohol Use: No    Review of Systems  10 Systems reviewed and are negative for acute change except as noted in the HPI.    Allergies  Atorvastatin; Rosuvastatin; Crestor; and Lipitor  Home Medications   Prior to Admission medications   Medication Sig Start Date End Date Taking? Authorizing Provider  aspirin 325 MG tablet Take 325 mg by mouth daily.   Yes Historical Provider, MD  Bisacodyl (DULCOLAX PO) Take 1 tablet by mouth daily as needed (for constipation). Reported on 12/07/2015   Yes Historical Provider, MD  cetirizine (ZYRTEC) 10 MG tablet Take 10 mg by mouth daily.   Yes Historical Provider, MD  cholecalciferol (VITAMIN D) 1000 UNITS tablet Take 1 tablet (1,000 Units total) by mouth daily. 03/09/15  Yes Aleksei [name] V, MD  dexamethasone (DECADRON) 6 MG tablet Take 3 tablets by mouth with food as directed Patient taking differently: Take 18 mg by mouth as directed. Takes 3 tablets (18 mg) on Tues and Fri on Days 1-14. Repeat Every 21 Days. 10/15/15  Yes Chauncey Cruel, MD  escitalopram (LEXAPRO) 10 MG tablet Take 1 tablet (10 mg total) by mouth daily. 10/07/15  Yes Aleksei [name] V, MD  ketoconazole (NIZORAL) 2 %  cream Apply 1 application topically daily. Patient taking differently: Apply 1 application topically daily as needed for irritation.  10/29/15  Yes Chauncey Cruel, MD  levofloxacin (LEVAQUIN) 500 MG tablet Take 500 mg by mouth daily. 10 Day Course. ABT Start Date 12/20/15 & End Date 12/29/15.   Yes Historical Provider, MD  levothyroxine (SYNTHROID, LEVOTHROID) 150 MCG tablet Take 1 tablet by mouth  daily 07/08/15  Yes Aleksei [name] V, MD  polyethylene glycol (MIRALAX / GLYCOLAX) packet Take 17 g by mouth daily as needed for mild constipation.    Yes Historical Provider, MD  senna (SENOKOT) 8.6 MG  tablet Take 1 tablet by mouth 2 (two) times daily. Reported on 12/07/2015   Yes Historical Provider, MD  valACYclovir (VALTREX) 1000 MG tablet Take 1 tablet (1,000 mg total) by mouth daily. 10/22/15  Yes Chauncey Cruel, MD  zolpidem (AMBIEN) 5 MG tablet Take 1-2 tablets (5-10 mg total) by mouth at bedtime as needed for sleep. 10/22/15  Yes Chauncey Cruel, MD  albuterol (PROVENTIL HFA;VENTOLIN HFA) 108 (90 Base) MCG/ACT inhaler Inhale 2 puffs into the lungs every 4 (four) hours as needed for wheezing or shortness of breath. 12/26/15   Modena Jansky, MD  Bortezomib (VELCADE IJ) Inject 1 Dose as directed as directed. Inject on Tues and Fri on Days 1-14. Repeat Every 21 Days.    Historical Provider, MD  metoCLOPramide (REGLAN) 5 MG tablet Take 1 tablet (5 mg total) by mouth every 6 (six) hours as needed for nausea. 11/23/15   Chauncey Cruel, MD  pantoprazole (PROTONIX) 40 MG tablet Take 1 tablet (40 mg total) by mouth 2 (two) times daily before a meal. 12/26/15   Modena Jansky, MD  pomalidomide (POMALYST) 4 MG capsule Take 1 capsule by mouth with water daily on days 1-14. Repeat every 21 days. 12/02/15   Nicholas Lose, MD  predniSONE (DELTASONE) 10 MG tablet Take 4 tabs daily for 2 days, then 3 tabs daily for 2 days, then 2 tabs daily for 2 days, then 1 tab daily for 2 days, then stop. 12/27/15   Modena Jansky, MD   BP 131/65 mmHg  Pulse 61  Temp(Src) 97.7 F (36.5 C) (Axillary)  Resp 18  Ht 5' 10"  (1.778 m)  Wt 185 lb 3.2 oz (84.006 kg)  BMI 26.57 kg/m2  SpO2 96% Physical Exam  Constitutional: He is oriented to person, place, and time.  Patient appears fatigued and uncomfortable. He is nontoxic with clear mental status. She does not have respiratory distress. Patient's general condition is well-nourished and well-developed without cachexia or general appearance of chronic illness.  HENT:  Head: Normocephalic and atraumatic.  Right Ear: External ear normal.  Left Ear: External ear normal.   Nose: Nose normal.  Mouth/Throat: Oropharynx is clear and moist.  Eyes: EOM are normal. Pupils are equal, round, and reactive to light.  Neck: Neck supple.  Cardiovascular: Normal rate, regular rhythm, normal heart sounds and intact distal pulses.   Pulmonary/Chest: Effort normal.  Sounds are decreased at the bases.crackles at the bases with occasional extra wheezes Moist cough with deep inspiration.  Abdominal: Soft. Bowel sounds are normal. He exhibits no distension and no mass. There is no tenderness. There is no rebound and no guarding.  Musculoskeletal: Normal range of motion. He exhibits no edema or tenderness.  Neurological: He is alert and oriented to person, place, and time. No cranial nerve deficit. He exhibits normal muscle tone. Coordination normal.  Skin: Skin is  warm and dry. No rash noted.  Psychiatric: He has a normal mood and affect.    ED Course  Procedures (including critical care time) Labs Review Labs Reviewed  RESPIRATORY VIRUS PANEL - Abnormal; Notable for the following:    Metapneumovirus Positive (*)    All other components within normal limits  CBC WITH DIFFERENTIAL/PLATELET - Abnormal; Notable for the following:    WBC 2.0 (*)    RBC 2.78 (*)    Hemoglobin 9.1 (*)    HCT 26.3 (*)    RDW 17.9 (*)    Platelets 38 (*)    Neutro Abs 1.5 (*)    Lymphs Abs 0.3 (*)    All other components within normal limits  COMPREHENSIVE METABOLIC PANEL - Abnormal; Notable for the following:    Sodium 126 (*)    CO2 18 (*)    Glucose, Bld 123 (*)    Calcium 7.6 (*)    Total Protein 6.0 (*)    Albumin 3.1 (*)    All other components within normal limits  URINALYSIS, ROUTINE W REFLEX MICROSCOPIC (NOT AT [facility]) - Abnormal; Notable for the following:    Hgb urine dipstick MODERATE (*)    All other components within normal limits  OSMOLALITY, URINE - Abnormal; Notable for the following:    Osmolality, Ur 290 (*)    All other components within normal limits   COMPREHENSIVE METABOLIC PANEL - Abnormal; Notable for the following:    CO2 21 (*)    Calcium 8.0 (*)    Total Protein 5.9 (*)    Albumin 3.1 (*)    All other components within normal limits  TROPONIN I - Abnormal; Notable for the following:    Troponin I 0.04 (*)    All other components within normal limits  CBC WITH DIFFERENTIAL/PLATELET - Abnormal; Notable for the following:    WBC 1.9 (*)    RBC 2.76 (*)    Hemoglobin 9.1 (*)    HCT 26.0 (*)    RDW 17.9 (*)    Platelets 34 (*)    Neutro Abs 1.1 (*)    Lymphs Abs 0.4 (*)    All other components within normal limits  URINE MICROSCOPIC-ADD ON - Abnormal; Notable for the following:    Squamous Epithelial / LPF 0-5 (*)    Bacteria, UA RARE (*)    All other components within normal limits  CBC WITH DIFFERENTIAL/PLATELET - Abnormal; Notable for the following:    WBC 2.1 (*)    RBC 2.90 (*)    Hemoglobin 9.5 (*)    HCT 26.9 (*)    RDW 17.7 (*)    Platelets 41 (*)    Neutro Abs 1.6 (*)    Lymphs Abs 0.2 (*)    All other components within normal limits  MRSA PCR SCREENING  GRAM STAIN  CULTURE, EXPECTORATED SPUTUM-ASSESSMENT  CULTURE, EXPECTORATED SPUTUM-ASSESSMENT  PNEUMOCYSTIS JIROVECI SMEAR BY DFA  LACTIC ACID, PLASMA  CREATININE, URINE, RANDOM  SODIUM, URINE, RANDOM  STREP PNEUMONIAE URINARY ANTIGEN  PROCALCITONIN  PROTIME-INR  APTT  MAGNESIUM  PHOSPHORUS  TSH  TROPONIN I  TROPONIN I  MULTIPLE MYELOMA PANEL, SERUM  KAPPA/LAMBDA LIGHT CHAINS  I-STAT CG4 LACTIC ACID, ED  I-STAT TROPOININ, ED    Imaging Review No results found. I have personally reviewed and evaluated these images and lab results as part of my medical decision-making.   EKG Interpretation   Date/Time:  Wednesday December 22 2015 17:09:59 EDT Ventricular Rate:  73 PR  Interval:  161 QRS Duration: 101 QT Interval:  388 QTC Calculation: 427 R Axis:   -33 Text Interpretation:  Sinus rhythm Left axis deviation ED PHYSICIAN  INTERPRETATION  AVAILABLE IN [facility] Confirmed by TEST, Record  ([id number]) on 12/23/2015 6:57:49 AM     Consultation: [facility] hospitalist for admission. MDM   Final diagnoses:  Multiple myeloma not having achieved remission (HCC)  General weakness  Respiratory anomaly   Patient has history of multiple myeloma. He has had increasing weakness and upper respiratory symptoms. Patient has been started on Levaquin but not improving. At this time he has been referred to the emergency department for admission. Treatment initiated and consultations placed to hospitalist for admission.    Charlesetta Shanks, MD 12/26/15 [phone]

## 2015-12-22 NOTE — Telephone Encounter (Signed)
Called ED Charge RN to inform that the UA was never resulted; and does not even appear to have actually been ordered.    This provider had RN call and specifically request the "add on" of the UA to urine that was already collected; but unable to determine if this message was received by the outside lab.   Requested that ED RN update ED MD.

## 2015-12-22 NOTE — ED Notes (Signed)
Patient transported to X-ray 

## 2015-12-22 NOTE — Telephone Encounter (Signed)
per pof to see Selena Lesser put pt in @1 -had to put pt in lab & sym mgmt @ 1

## 2015-12-22 NOTE — Progress Notes (Signed)
SYMPTOM MANAGEMENT CLINIC    Chief Complaint: Fever   HPI:  Andre [name] 65 y.o. male diagnosed with multiple myeloma.  Currently undergoing Velcade/Pomalyst chemotherapy regimen.    Patient presents to the emergency department on Sunday, 12/19/2015 with complaint of URI and congested cough.  Chest x-ray obtained was clear; with no acute findings.  However, patient was prescribed Levaquin prophylactically.  Patient was seen at the [facility] on Monday, 12/20/2015; he was encouraged to initiate his Levaquin antibiotics for probable acute bronchitis.  Since that time,-.  Patient states he has become increasingly fatigued and weak.  He has been list at this time in the bed; and is unable to ambulate without full assist.  He has had both urine and stool incontinence; secondary to inability to ambulate to the bathroom quickly.  Patient also reports low-grade fever and chills for the past few days.  He reports worsening congested/productive cough as well.  He feels short of breath and has heard some wheezes.  Patient also states that he has tried to drink fluids; but has had poor food intake for the past 3 days.  Exam today reveals patient to be fatigued and extremely weak.  He is also very pale today.  Patient appears mildly short of breath; and has audible crackles.  Breath sounds revealed rhonchi throughout all lung fields; and increased wheezing as well.  Blood cultures 2 obtained; as well as urinalysis and urine culture.  Have not reviewed the urinalysis results as of yet.  Blood cultures and urine culture results are pending.  Metabolic panel was essentially within normal limits; with the exception of a sodium of 130.  Vital signs revealed temperature 99.1, and O2 sat 94% on room air.  Patient had a large bowel movement while in the cancer Center exam room which was normal.  Due to initial diagnosis of failed outpatient therapy-patient will be transported to the emergency  department for further evaluation and management.  Differential diagnoses to consider should include possible pneumonia, impending sepsis.  Patient should be considered a full code; since there are no advanced directives on file in the chart.  Brief history.  Report was called to the emergency department charge nurse; prior to the patient being transported to the emergency department the wheelchair.  Per the cancer Center nurse.  No history exists.    Review of Systems  Constitutional: Positive for fever, chills, weight loss and malaise/fatigue.  Respiratory: Positive for cough, sputum production, shortness of breath and wheezing. Negative for hemoptysis.   Musculoskeletal: Positive for falls.  Neurological: Positive for weakness.  All other systems reviewed and are negative.   Past Medical History  Diagnosis Date  . Hypertension   . Hyperlipidemia   . Duodenal ulcer   . Allergy   . Thyroid disease   . GERD (gastroesophageal reflux disease)   . Depression   . Anemia   . Hypothyroidism   . Asthma     no treatment x 20 years  . Arthritis   . Double vision     occurs at times   . Multiple myeloma 07/04/2011    Past Surgical History  Procedure Laterality Date  . Cardiolite study  11/25/2003    NORMAL  . Bone marrow transplant  2011    for MM  . Tonsillectomy      has HYPERLIPIDEMIA; ANEMIA; Asthma; SCHATZKI'S RING; Peptic ulcer disease; HIATAL HERNIA; SLEEP APNEA, MILD; TRANSAMINASES, SERUM, ELEVATED; Multiple myeloma ([place]); TIA (transient ischemic attack); Transient diplopia; Fatigue;  Insomnia; Reactive depression (situational); Vitamin D deficiency; Hypothyroidism; Hypogonadism male; Abnormal brain MRI; Cough; Skin lesion, superficial; Hemorrhage of rectum and anus; Raynaud's phenomenon; Transient global amnesia; Actinic keratosis; Thrombocytopenia ([place]); SBO (small bowel obstruction) ([place]); Chronic anemia; Dehydration; Weakness; and Fever on his problem list.    is  allergic to atorvastatin; rosuvastatin; crestor; and lipitor.    Medication List       This list is accurate as of: 12/22/15  5:08 PM.  Always use your most recent med list.               aspirin 325 MG tablet  Take 325 mg by mouth daily.     cetirizine 10 MG tablet  Commonly known as:  ZYRTEC  Take 10 mg by mouth daily.     cholecalciferol 1000 units tablet  Commonly known as:  VITAMIN D  Take 1 tablet (1,000 Units total) by mouth daily.     dexamethasone 6 MG tablet  Commonly known as:  DECADRON  Take 3 tablets by mouth with food as directed     DULCOLAX PO  Take 1 tablet by mouth daily as needed (for constipation). Reported on 12/07/2015     escitalopram 10 MG tablet  Commonly known as:  LEXAPRO  Take 1 tablet (10 mg total) by mouth daily.     ketoconazole 2 % cream  Commonly known as:  NIZORAL  Apply 1 application topically daily.     levothyroxine 150 MCG tablet  Commonly known as:  SYNTHROID, LEVOTHROID  Take 1 tablet by mouth  daily     metoCLOPramide 5 MG tablet  Commonly known as:  REGLAN  Take 1 tablet (5 mg total) by mouth every 6 (six) hours as needed for nausea.     pantoprazole 40 MG tablet  Commonly known as:  PROTONIX  Take 1 tablet (40 mg total) by mouth daily.     polyethylene glycol packet  Commonly known as:  MIRALAX / GLYCOLAX  Take 17 g by mouth daily as needed for mild constipation.     pomalidomide 4 MG capsule  Commonly known as:  POMALYST  Take 1 capsule by mouth with water daily on days 1-14. Repeat every 21 days.     senna 8.6 MG tablet  Commonly known as:  SENOKOT  Take 1 tablet by mouth 2 (two) times daily. Reported on 12/07/2015     valACYclovir 1000 MG tablet  Commonly known as:  VALTREX  Take 1 tablet (1,000 mg total) by mouth daily.     VELCADE IJ  Inject 1 Dose as directed as directed. Inject on Tues and Fri on Days 1-14. Repeat Every 21 Days.     zolpidem 5 MG tablet  Commonly known as:  AMBIEN  Take 1-2 tablets  (5-10 mg total) by mouth at bedtime as needed for sleep.         PHYSICAL EXAMINATION  Oncology Vitals 12/22/2015 12/22/2015  Height - 178 cm  Weight - (No Data)  Weight (lbs) - (No Data)  Temp 99.1 99.1  Pulse 72 83  Resp 25 17  SpO2 93 94   BP Readings from Last 2 Encounters:  12/22/15 139/81  12/22/15 148/86    Physical Exam  Constitutional: He is oriented to person, place, and time. He appears malnourished and dehydrated. He appears unhealthy. He appears cachectic. He has a sickly appearance.  HENT:  Head: Normocephalic and atraumatic.  Eyes: Conjunctivae and EOM are normal. Pupils are equal, round, and reactive  to light. Right eye exhibits no discharge. Left eye exhibits no discharge. No scleral icterus.  Neck: Normal range of motion. Neck supple. No JVD present. No tracheal deviation present. No thyromegaly present.  Cardiovascular: Normal rate, regular rhythm, normal heart sounds and intact distal pulses.   Pulmonary/Chest: No stridor. No respiratory distress. He has wheezes. He has rales. He exhibits no tenderness.  Rhonchi to bilateral lung fields; with occasional wheezing.  Mild shortness of breath noted on exam.  Abdominal: Soft. Bowel sounds are normal. He exhibits no distension and no mass. There is no tenderness. There is no rebound and no guarding.  Musculoskeletal: Normal range of motion. He exhibits edema. He exhibits no tenderness.  Trace edema to bilateral ankles.  Lymphadenopathy:    He has no cervical adenopathy.  Neurological: He is alert and oriented to person, place, and time.  Patient appears very weak; and needed a 2 person assist to use the bedside commode.  Skin: Skin is warm and dry. No rash noted. No erythema. There is pallor.  Psychiatric: Affect normal.  Patient's wife notes the patient has appeared slightly confused at times this past week; but patient appeared able to answer all questions or follow all commands during exam today.  Nursing note and  vitals reviewed.   LABORATORY DATA:. Admission on 12/22/2015  Component Date Value Ref Range Status  . Lactic Acid, Venous 12/22/2015 1.12  0.5 - 2.0 mmol/L Final  . Troponin i, poc 12/22/2015 0.01  0.00 - 0.08 ng/mL Final  . Comment 3 12/22/2015          Final   Comment: Due to the release kinetics of cTnI, a negative result within the first hours of the onset of symptoms does not rule out myocardial infarction with certainty. If myocardial infarction is still suspected, repeat the test at appropriate intervals.   Appointment on 12/22/2015  Component Date Value Ref Range Status  . WBC 12/22/2015 2.4* 4.0 - 10.3 10e3/uL Final  . NEUT# 12/22/2015 1.7  1.5 - 6.5 10e3/uL Final  . HGB 12/22/2015 10.0* 13.0 - 17.1 g/dL Final  . HCT 12/22/2015 28.8* 38.4 - 49.9 % Final  . Platelets 12/22/2015 39* 140 - 400 10e3/uL Final  . MCV 12/22/2015 94.7  79.3 - 98.0 fL Final  . MCH 12/22/2015 32.9  27.2 - 33.4 pg Final  . MCHC 12/22/2015 34.7  32.0 - 36.0 g/dL Final  . RBC 12/22/2015 3.04* 4.20 - 5.82 10e6/uL Final  . RDW 12/22/2015 18.1* 11.0 - 14.6 % Final  . lymph# 12/22/2015 0.3* 0.9 - 3.3 10e3/uL Final  . MONO# 12/22/2015 0.4  0.1 - 0.9 10e3/uL Final  . Eosinophils Absolute 12/22/2015 0.0  0.0 - 0.5 10e3/uL Final  . Basophils Absolute 12/22/2015 0.0  0.0 - 0.1 10e3/uL Final  . NEUT% 12/22/2015 69.7  39.0 - 75.0 % Final  . LYMPH% 12/22/2015 12.9* 14.0 - 49.0 % Final  . MONO% 12/22/2015 17.0* 0.0 - 14.0 % Final  . EOS% 12/22/2015 0.0  0.0 - 7.0 % Final  . BASO% 12/22/2015 0.4  0.0 - 2.0 % Final  . Sodium 12/22/2015 130* 136 - 145 mEq/L Final  . Potassium 12/22/2015 4.1  3.5 - 5.1 mEq/L Final  . Chloride 12/22/2015 100  98 - 109 mEq/L Final  . CO2 12/22/2015 23  22 - 29 mEq/L Final  . Glucose 12/22/2015 91  70 - 140 mg/dl Final   Glucose reference range is for nonfasting patients. Fasting glucose reference range is 70- 100.  [name]  BUN 12/22/2015 16.0  7.0 - 26.0 mg/dL Final  . Creatinine  12/22/2015 1.2  0.7 - 1.3 mg/dL Final  . Total Bilirubin 12/22/2015 0.36  0.20 - 1.20 mg/dL Final  . Alkaline Phosphatase 12/22/2015 77  40 - 150 U/L Final  . AST 12/22/2015 22  5 - 34 U/L Final  . ALT 12/22/2015 24  0 - 55 U/L Final  . Total Protein 12/22/2015 6.6  6.4 - 8.3 g/dL Final  . Albumin 12/22/2015 3.2* 3.5 - 5.0 g/dL Final  . Calcium 12/22/2015 8.8  8.4 - 10.4 mg/dL Final  . Anion Gap 12/22/2015 7  3 - 11 mEq/L Final  . EGFR 12/22/2015 66* >90 ml/min/1.73 m2 Final   eGFR is calculated using the CKD-EPI Creatinine Equation (2009)  Admission on 12/19/2015, Discharged on 12/20/2015  Component Date Value Ref Range Status  . Sodium 12/19/2015 135  135 - 145 mmol/L Final  . Potassium 12/19/2015 4.2  3.5 - 5.1 mmol/L Final  . Chloride 12/19/2015 104  101 - 111 mmol/L Final  . CO2 12/19/2015 24  22 - 32 mmol/L Final  . Glucose, Bld 12/19/2015 104* 65 - 99 mg/dL Final  . BUN 12/19/2015 24* 6 - 20 mg/dL Final  . Creatinine, Ser 12/19/2015 1.19  0.61 - 1.24 mg/dL Final  . Calcium 12/19/2015 8.9  8.9 - 10.3 mg/dL Final  . Total Protein 12/19/2015 5.8* 6.5 - 8.1 g/dL Final  . Albumin 12/19/2015 3.3* 3.5 - 5.0 g/dL Final  . AST 12/19/2015 26  15 - 41 U/L Final  . ALT 12/19/2015 31  17 - 63 U/L Final  . Alkaline Phosphatase 12/19/2015 66  38 - 126 U/L Final  . Total Bilirubin 12/19/2015 0.3  0.3 - 1.2 mg/dL Final  . GFR calc non Af Amer 12/19/2015 >60  >60 mL/min Final  . GFR calc Af Amer 12/19/2015 >60  >60 mL/min Final   Comment: (NOTE) The eGFR has been calculated using the CKD EPI equation. This calculation has not been validated in all clinical situations. eGFR's persistently <60 mL/min signify possible Chronic Kidney Disease.   . Anion gap 12/19/2015 7  5 - 15 Final  . WBC 12/19/2015 3.2* 4.0 - 10.5 K/uL Final  . RBC 12/19/2015 3.08* 4.22 - 5.81 MIL/uL Final  . Hemoglobin 12/19/2015 10.2* 13.0 - 17.0 g/dL Final  . HCT 12/19/2015 29.4* 39.0 - 52.0 % Final  . MCV 12/19/2015  95.5  78.0 - 100.0 fL Final  . MCH 12/19/2015 33.1  26.0 - 34.0 pg Final  . MCHC 12/19/2015 34.7  30.0 - 36.0 g/dL Final  . RDW 12/19/2015 18.0* 11.5 - 15.5 % Final  . Platelets 12/19/2015 50* 150 - 400 K/uL Final   Comment: SPECIMEN CHECKED FOR CLOTS REPEATED TO VERIFY PLATELET COUNT CONFIRMED BY SMEAR LARGE PLATELETS PRESENT   . Neutrophils Relative % 12/19/2015 55   Final  . Neutro Abs 12/19/2015 1.7  1.7 - 7.7 K/uL Final  . Lymphocytes Relative 12/19/2015 14   Final  . Lymphs Abs 12/19/2015 0.4* 0.7 - 4.0 K/uL Final  . Monocytes Relative 12/19/2015 16   Final  . Monocytes Absolute 12/19/2015 0.5  0.1 - 1.0 K/uL Final  . Eosinophils Relative 12/19/2015 15   Final  . Eosinophils Absolute 12/19/2015 0.5  0.0 - 0.7 K/uL Final  . Basophils Relative 12/19/2015 0   Final  . Basophils Absolute 12/19/2015 0.0  0.0 - 0.1 K/uL Final  . Influenza A By PCR 12/19/2015 NEGATIVE  NEGATIVE Final  .  Influenza [place] By PCR 12/19/2015 NEGATIVE  NEGATIVE Final  . H1N1 flu by pcr 12/19/2015 NOT DETECTED  NOT DETECTED Final   Comment:        The Xpert Flu assay (FDA approved for nasal aspirates or washes and nasopharyngeal swab specimens), is intended as an aid in the diagnosis of influenza and should not be used as a sole basis for treatment. Performed at [facility]   . [place] Natriuretic Peptide 12/19/2015 103.5* 0.0 - 100.0 pg/mL Final    RADIOGRAPHIC STUDIES: Dg Chest 2 View  12/22/2015  CLINICAL DATA:  Worsening fatigue and confusion.  Neutropenia. EXAM: CHEST  2 VIEW COMPARISON:  12/19/2015 FINDINGS: The heart size and mediastinal contours are within normal limits. Both lungs are clear. The visualized skeletal structures are unremarkable. IMPRESSION: No active cardiopulmonary disease. Electronically Signed   By: Andreas Newport M.D.   On: 12/22/2015 18:26   Dg Chest 2 View  12/19/2015  CLINICAL DATA:  Shortness of breath and cold symptoms, initial encounter EXAM: CHEST  2 VIEW COMPARISON:   07/08/2015 FINDINGS: Cardiac shadow is within normal limits. The lungs are clear bilaterally. Bony structures are within normal limits. IMPRESSION: No active cardiopulmonary disease. Electronically Signed   By: Inez Catalina M.D.   On: 12/19/2015 21:11    ASSESSMENT/PLAN:    Multiple myeloma [facility]) Patient is in preparation with [facility] for a potential bone marrow transplant soon.  He is currently undergoing Velcade injections / Pomalyst oral regimen.  He last received a Velcade injection on 12/17/2015.  See further notes for today's visit.  Note: Per chart-patient should be considered a full code.  Patient is scheduled to return on 12/28/2015 for labs, visit, and his next cycle of chemotherapy.  Thrombocytopenia ([place]) Blood counts obtained today reveal a WBC of 2.4, ANC 1.7, hemoglobin 10.0, and platelet count down to 39.  Patient denies any worsening issues with either easy bleeding or bruising.  Will continue to monitor closely.  Dehydration Patient states that he has been feeling very poorly for the last few days.  He states he has been drinking fluids; but eating very little.  Labs obtained today reveal a sodium level 130.  Patient appears dehydrated and very weak.  Patient will be transported to the emergency department for further evaluation and management.  Weakness Patient states that he has become progressively fatigued and weak.  Within the past few days.  He states he has been spending most of this time in the bed.  His wife reports the patient has become a full assist with any ambulation whatsoever.  She also reports the patient has had both urine and stool incontinence; secondary to inability to get to the bathroom in time.  Patient will be transported to the emergency department for further evaluation and management this afternoon.  Fever Patient presents to the emergency department on Sunday, 12/19/2015 with complaint of URI and congested cough.  Chest x-ray obtained  was clear; with no acute findings.  However, patient was prescribed Levaquin prophylactically.  Patient was seen at the [facility] on Monday, 12/20/2015; he was encouraged to initiate his Levaquin antibiotics for probable acute bronchitis.  Since that time,-.  Patient states he has become increasingly fatigued and weak.  He has been list at this time in the bed; and is unable to ambulate without full assist.  He has had both urine and stool incontinence; secondary to inability to ambulate to the bathroom quickly.  Patient also reports low-grade fever and  chills for the past few days.  He reports worsening congested/productive cough as well.  He feels short of breath and has heard some wheezes.  Patient also states that he has tried to drink fluids; but has had poor food intake for the past 3 days.  Exam today reveals patient to be fatigued and extremely weak.  He is also very pale today.  Patient appears mildly short of breath; and has audible crackles.  Breath sounds revealed rhonchi throughout all lung fields; and increased wheezing as well.  Blood cultures 2 obtained; as well as urinalysis and urine culture.  Have not reviewed the urinalysis results as of yet.  Blood cultures and urine culture results are pending.  Metabolic panel was essentially within normal limits; with the exception of a sodium of 130.  Vital signs revealed temperature 99.1, and O2 sat 94% on room air.  Patient had a large bowel movement while in the cancer Center exam room which was normal.  Due to initial diagnosis of failed outpatient therapy-patient will be transported to the emergency department for further evaluation and management.  Differential diagnoses to consider should include possible pneumonia, impending sepsis.  Patient should be considered a full code; since there are no advanced directives on file in the chart.  Brief history.  Report was called to the emergency department charge nurse; prior to  the patient being transported to the emergency department the wheelchair.  Per the cancer Center nurse.    Patient stated understanding of all instructions; and was in agreement with this plan of care. The patient knows to call the clinic with any problems, questions or concerns.   Review/collaboration with Dr. [name] regarding all aspects of patient's visit today.   Total time spent with patient was 40 minutes;  with greater than 75 percent of that time spent in face to face counseling regarding patient's symptoms,  and coordination of care and follow up.  Disclaimer:This dictation was prepared with Dragon/digital dictation along with [employer]. Any transcriptional errors that result from this process are unintentional.  Drue Second, NP 12/22/2015

## 2015-12-22 NOTE — Assessment & Plan Note (Addendum)
Patient states that he has been feeling very poorly for the last few days.  He states he has been drinking fluids; but eating very little.  Labs obtained today reveal a sodium level 130.  Patient appears dehydrated and very weak.  Patient will be transported to the emergency department for further evaluation and management.

## 2015-12-22 NOTE — ED Notes (Signed)
Per [facility] center: pt presents with increased fatigue and confusion, also neutropenic. Pt UA and culture sent down. 1 set of blood cultures. Pt states that he feels a little "goofy" but denies pain.

## 2015-12-22 NOTE — ED Notes (Signed)
Bed: WA21 Expected date:  Expected time:  Means of arrival:  Comments: Hold for cancer center

## 2015-12-22 NOTE — Progress Notes (Signed)
Pharmacy Antibiotic Note  Andre [name] is a 65 y.o. male with multiple myeloma admitted from the [facility] on 12/22/2015 with possible pneumonia and sepsis after failed outpatient therapy with Levaquin. Pharmacy has been consulted for Vancomycin and Zosyn dosing.  Plan: Vancomycin 1g IV every 12 hours.  Goal trough 15-20 mcg/mL.  Plan for Vancomycin trough level at steady state. Zosyn 3.375g IV x 1 over 30 min given in ED. Continue with Zosyn 3.375g IV q8h (infuse over 4 hours). Monitor renal function, cultures, clinical course.     Temp (24hrs), Avg:99.1 F (37.3 C), Min:99.1 F (37.3 C), Max:99.1 F (37.3 C)   Recent Labs Lab 12/17/15 1031 12/19/15 2143 12/22/15 1326 12/22/15 1328 12/22/15 1745  WBC 4.1 3.2* 2.4*  --   --   CREATININE  --  1.19  --  1.2  --   LATICACIDVEN  --   --   --   --  1.12    Estimated Creatinine Clearance: 62.5 mL/min (by C-G formula based on Cr of 1.2).    Allergies  Allergen Reactions  . Atorvastatin Other (See Comments)  . Rosuvastatin     Other reaction(s): Liver Disorder  . Crestor [Rosuvastatin Calcium]     ADVERSE EFFECTS ON LIVER  . Lipitor [Atorvastatin Calcium]     Antimicrobials this admission: 4/5 >> Vancomycin >> 4/5 >> Zosyn >>  Dose adjustments this admission: ---  Microbiology results: Ordered at [place]: 4/5 BCx: pending 4/5 UCx: pending  Thank you for allowing pharmacy to be a part of this patient's care.   Lindell Spar, PharmD, BCPS Pager: [phone] 12/22/2015 6:41 PM

## 2015-12-22 NOTE — Telephone Encounter (Signed)
Writer spoke with patient's wife.  She is extremely worried because of patient's sudden onset confusion and weakness.  Patient has fallen several times trying to ambulate to the BR,  He's also has been incontinent of urine.  Patient has been hydrating but not eating the last several days.  As far as the bronchitis, patient's wife feels that it has improved, he has taken 3 doses of the levofloxacin.  He continues to have a productive cough with clear mucus but the cough has lessened in severity.

## 2015-12-22 NOTE — Telephone Encounter (Signed)
no double booking in Sym Mgmt couldnt sch @ 1:30

## 2015-12-22 NOTE — Assessment & Plan Note (Signed)
Patient presents to the emergency department on Sunday, 12/19/2015 with complaint of URI and congested cough.  Chest x-ray obtained was clear; with no acute findings.  However, patient was prescribed Levaquin prophylactically.  Patient was seen at the [facility] on Monday, 12/20/2015; he was encouraged to initiate his Levaquin antibiotics for probable acute bronchitis.  Since that time,-.  Patient states he has become increasingly fatigued and weak.  He has been list at this time in the bed; and is unable to ambulate without full assist.  He has had both urine and stool incontinence; secondary to inability to ambulate to the bathroom quickly.  Patient also reports low-grade fever and chills for the past few days.  He reports worsening congested/productive cough as well.  He feels short of breath and has heard some wheezes.  Patient also states that he has tried to drink fluids; but has had poor food intake for the past 3 days.  Exam today reveals patient to be fatigued and extremely weak.  He is also very pale today.  Patient appears mildly short of breath; and has audible crackles.  Breath sounds revealed rhonchi throughout all lung fields; and increased wheezing as well.  Blood cultures 2 obtained; as well as urinalysis and urine culture.  Have not reviewed the urinalysis results as of yet.  Blood cultures and urine culture results are pending.  Metabolic panel was essentially within normal limits; with the exception of a sodium of 130.  Vital signs revealed temperature 99.1, and O2 sat 94% on room air.  Patient had a large bowel movement while in the cancer Center exam room which was normal.  Due to initial diagnosis of failed outpatient therapy-patient will be transported to the emergency department for further evaluation and management.  Differential diagnoses to consider should include possible pneumonia, impending sepsis.  Patient should be considered a full code; since there are  no advanced directives on file in the chart.  Brief history.  Report was called to the emergency department charge nurse; prior to the patient being transported to the emergency department the wheelchair.  Per the cancer Center nurse.

## 2015-12-22 NOTE — Assessment & Plan Note (Signed)
Blood counts obtained today reveal a WBC of 2.4, ANC 1.7, hemoglobin 10.0, and platelet count down to 39.  Patient denies any worsening issues with either easy bleeding or bruising.  Will continue to monitor closely.

## 2015-12-22 NOTE — H&P (Signed)
PCP: [name], MD  Oncology: Magrinat  Referring provider Pfeiffer   Chief Complaint:  Fatigue and cough  HPI: Andre [name] is a 65 y.o. male   has a past medical history of Hypertension; Hyperlipidemia; Duodenal ulcer; Allergy; Thyroid disease; GERD (gastroesophageal reflux disease); Depression; Anemia; Hypothyroidism; Asthma; Arthritis; Double vision; and Multiple myeloma (07/04/2011).   Presented with worsening cough and fevers 6 days. He has been seen in the emergency department on the second of April complaining of URI at that point he was discharged to home as there was no evidence of pneumonia. He was given empiric Levaquin came back to [facility] and referred still complaining of increased fatigue and generalized fatigue weakness having her time getting out of bed both urinary and stool incontinence. Reports  Low grade fevers up to 100  and chills and productive cough yellow sputum. He have had some runny nose the day before. Reports shortness of breath and chest tightness. Patient has  not been eating well but tries to drink some fluids. Reports no bleeding no hemoptysis.  While at [place] blood cultures were obtained and urine analysis. Reports hx of asthma but it has not bothered him for a while.  He was sent to emergency department [place] ER: White blood cell count 2.4 hemoglobin 10 platelets 30 Sodium 1:30   Regarding pertinent past history: History of multiple myeloma currently on Velcade/Pomalyst last treatment was 5 days ago.   Hospitalist was called for admission for SIRS and increased work of breathing  Review of Systems:    Pertinent positives include:  Fevers, chills, fatigue, shortness of breath at rest.  excess mucus,  productive cough, wheezing. dyspnea on exertion, Constitutional:  No weight loss, night sweats, weight loss  HEENT:  No headaches, Difficulty swallowing,Tooth/dental problems,Sore throat,  No sneezing, itching, ear ache, nasal  congestion, post nasal drip,  Cardio-vascular:  No chest pain, Orthopnea, PND, anasarca, dizziness, palpitations.no Bilateral lower extremity swelling  GI:  No heartburn, indigestion, abdominal pain, nausea, vomiting, diarrhea, change in bowel habits, loss of appetite, melena, blood in stool, hematemesis Resp:     No coughing up of blood.No change in color of mucus.No  Skin:  no rash or lesions. No jaundice GU:  no dysuria, change in color of urine, no urgency or frequency. No straining to urinate.  No flank pain.  Musculoskeletal:  No joint pain or no joint swelling. No decreased range of motion. No back pain.  Psych:  No change in mood or affect. No depression or anxiety. No memory loss.  Neuro: no localizing neurological complaints, no tingling, no weakness, no double vision, no gait abnormality, no slurred speech, no confusion  Otherwise ROS are negative except for above, 10 systems were reviewed  Past Medical History: Past Medical History  Diagnosis Date  . Hypertension   . Hyperlipidemia   . Duodenal ulcer   . Allergy   . Thyroid disease   . GERD (gastroesophageal reflux disease)   . Depression   . Anemia   . Hypothyroidism   . Asthma     no treatment x 20 years  . Arthritis   . Double vision     occurs at times   . Multiple myeloma 07/04/2011   Past Surgical History  Procedure Laterality Date  . Cardiolite study  11/25/2003    NORMAL  . Bone marrow transplant  2011    for MM  . Tonsillectomy       Medications: Prior to Admission medications  Medication Sig Start Date End Date Taking? Authorizing Provider  aspirin 325 MG tablet Take 325 mg by mouth daily.   Yes Historical Provider, MD  Bisacodyl (DULCOLAX PO) Take 1 tablet by mouth daily as needed (for constipation). Reported on 12/07/2015   Yes Historical Provider, MD  cetirizine (ZYRTEC) 10 MG tablet Take 10 mg by mouth daily.   Yes Historical Provider, MD  cholecalciferol (VITAMIN D) 1000 UNITS tablet  Take 1 tablet (1,000 Units total) by mouth daily. 03/09/15  Yes Aleksei [name] V, MD  dexamethasone (DECADRON) 6 MG tablet Take 3 tablets by mouth with food as directed Patient taking differently: Take 18 mg by mouth as directed. Takes 3 tablets (18 mg) on Tues and Fri on Days 1-14. Repeat Every 21 Days. 10/15/15  Yes Chauncey Cruel, MD  escitalopram (LEXAPRO) 10 MG tablet Take 1 tablet (10 mg total) by mouth daily. 10/07/15  Yes Aleksei [name] V, MD  ketoconazole (NIZORAL) 2 % cream Apply 1 application topically daily. Patient taking differently: Apply 1 application topically daily as needed for irritation.  10/29/15  Yes Chauncey Cruel, MD  levofloxacin (LEVAQUIN) 500 MG tablet Take 500 mg by mouth daily. 10 Day Course. ABT Start Date 12/20/15 & End Date 12/29/15.   Yes Historical Provider, MD  levothyroxine (SYNTHROID, LEVOTHROID) 150 MCG tablet Take 1 tablet by mouth  daily 07/08/15  Yes Aleksei [name] V, MD  polyethylene glycol (MIRALAX / GLYCOLAX) packet Take 17 g by mouth daily as needed for mild constipation.    Yes Historical Provider, MD  senna (SENOKOT) 8.6 MG tablet Take 1 tablet by mouth 2 (two) times daily. Reported on 12/07/2015   Yes Historical Provider, MD  valACYclovir (VALTREX) 1000 MG tablet Take 1 tablet (1,000 mg total) by mouth daily. 10/22/15  Yes Chauncey Cruel, MD  zolpidem (AMBIEN) 5 MG tablet Take 1-2 tablets (5-10 mg total) by mouth at bedtime as needed for sleep. 10/22/15  Yes Chauncey Cruel, MD  Bortezomib (VELCADE IJ) Inject 1 Dose as directed as directed. Inject on Tues and Fri on Days 1-14. Repeat Every 21 Days.    Historical Provider, MD  metoCLOPramide (REGLAN) 5 MG tablet Take 1 tablet (5 mg total) by mouth every 6 (six) hours as needed for nausea. 11/23/15   Chauncey Cruel, MD  pantoprazole (PROTONIX) 40 MG tablet Take 1 tablet (40 mg total) by mouth daily. 12/14/15   Chauncey Cruel, MD  pomalidomide (POMALYST) 4 MG capsule Take 1 capsule by mouth with  water daily on days 1-14. Repeat every 21 days. 12/02/15   Nicholas Lose, MD    Allergies:   Allergies  Allergen Reactions  . Atorvastatin Other (See Comments)  . Rosuvastatin     Other reaction(s): Liver Disorder  . Crestor [Rosuvastatin Calcium]     ADVERSE EFFECTS ON LIVER  . Lipitor [Atorvastatin Calcium]     Social History:  Ambulatory   independently   Lives at home  With family     reports that he quit smoking about 46 years ago. He has never used smokeless tobacco. He reports that he does not drink alcohol or use illicit drugs.     Family History: family history includes Asthma in his father; Diabetes in his father; Hypertension in his father; Pancreatic cancer in his brother; Stroke in his father; Throat cancer in his mother.    Physical Exam: Patient Vitals for the past 24 hrs:  BP Temp Temp src Pulse Resp SpO2  12/22/15  1711 139/81 mmHg 99.1 F (37.3 C) Oral 72 25 93 %    1. General:  increased work of breathing 2. Psychological: Alert and  Oriented 3. Head/ENT:     Dry Mucous Membranes                          Head Non traumatic, neck supple                          Normal  Dentition 4. SKIN:   decreased Skin turgor,  Skin clean Dry and intact no rash 5. Heart: Regular rate and rhythm no Murmur, Rub or gallop 6. Lungs: some wheezes and crackles  St the bases 7. Abdomen: Soft, non-tender, Non distended 8. Lower extremities: no clubbing, cyanosis, or edema 9. Neurologically Grossly intact, moving all 4 extremities equally 10. MSK: Normal range of motion  body mass index is unknown because there is no weight on file.   Labs on Admission:   Results for orders placed or performed during the hospital encounter of 12/22/15 (from the past 24 hour(s))  I-stat troponin, ED     Status: None   Collection Time: 12/22/15  5:42 PM  Result Value Ref Range   Troponin i, poc 0.01 0.00 - 0.08 ng/mL   Comment 3          I-Stat CG4 Lactic Acid, ED     Status: None    Collection Time: 12/22/15  5:45 PM  Result Value Ref Range   Lactic Acid, Venous 1.12 0.5 - 2.0 mmol/L    UA will obtain  Lab Results  Component Value Date   HGBA1C 5.4 03/27/2012    Estimated Creatinine Clearance: 62.5 mL/min (by C-G formula based on Cr of 1.2).  BNP (last 3 results) No results for input(s): PROBNP in the last 8760 hours.  Other results:  I have pearsonaly reviewed this: ECG REPORT  Rate: 73  Rhythm:  ST&T Change: no ischemic changes QTC 427  There were no vitals filed for this visit.   Cultures: No results found for: [place], [place], CULT, REPTSTATUS   Radiological Exams on Admission: Dg Chest 2 View  12/22/2015  CLINICAL DATA:  Worsening fatigue and confusion.  Neutropenia. EXAM: CHEST  2 VIEW COMPARISON:  12/19/2015 FINDINGS: The heart size and mediastinal contours are within normal limits. Both lungs are clear. The visualized skeletal structures are unremarkable. IMPRESSION: No active cardiopulmonary disease. Electronically Signed   By: Andreas Newport M.D.   On: 12/22/2015 18:26    Chart has been reviewed  Family  at  Bedside  plan of care was discussed with  Daughter Ernie Avena [phone]  Wife Gurney Balthazor [phone]  Assessment/Plan  65 year old gentleman history of multiple myeloma currently on chemotherapy complicated by thrombocytopenia presents with significant URI symptoms associated with possible asthma exacerbation although cannot ruled out early SIRS given immunocompromised state  Present on Admission:  . Asthma exacerbation -  continue when necessary albuterol as well as scheduled Atrovent given past history of tobacco abuse. Given continuous wheezing and no evidence of pneumonia, and chest x-ray will initiate steroid taper holding off on large doses of steroids given patient already has immunocompromised state from multiple myeloma.   . Multiple myeloma [facility]) - discussed case with hematology oncology who will see patient  in the morning.  . Thrombocytopenia ([place])   - progressive most likely secondary to multiple myeloma obtain type and screen  .  Hypothyroidism - continue Synthroid check TSH   possible sirs grieving increased respiratory rate and leukopenia monitor for any evidence of severe sepsis at this point lactic acid stable no evidence of hypotension. Continue initiated broad-spectrum antibiotics given   immunocompromise state of blood cultures and negative and patient improves this could be discontinued in the future   Prophylaxis: SCD   CODE STATUS:    DNR/DNI as per patient    Disposition:  To home once workup is complete and patient is stable  Other plan as per orders.  I have spent a total of 67 min on this admission   extra time was spent to discuss case  With Dr. Kara Pacer 12/22/2015, 9:04 PM    [facility]  Pager [phone]   after 2 AM please page floor coverage PA If 7AM-7PM, please contact the day team taking care of the patient  [email or web address]  Password [email or web address]

## 2015-12-22 NOTE — Assessment & Plan Note (Signed)
Patient is in preparation with [facility] for a potential bone marrow transplant soon.  He is currently undergoing Velcade injections / Pomalyst oral regimen.  He last received a Velcade injection on 12/17/2015.  See further notes for today's visit.  Note: Per chart-patient should be considered a full code.  Patient is scheduled to return on 12/28/2015 for labs, visit, and his next cycle of chemotherapy.

## 2015-12-22 NOTE — Telephone Encounter (Signed)
"  My husband [name] is sick going through chemotherapy for transplant.  Bronchitis is worse.  I need to know what to do.  I can't handle him, he is not in good shape.  May need to be admitted.  May have low grade temperature.  He's weak his bowels are moving as normal but he can't maneuver to get to the bathroom in time and also wets on himself.  Inability to get around period.  Very confused, going in the closet.  Call home [phone]."    This nurse advised she start with having him eat yogurt for bowels and obtain bedside commode.[name]

## 2015-12-22 NOTE — Assessment & Plan Note (Signed)
Patient states that he has become progressively fatigued and weak.  Within the past few days.  He states he has been spending most of this time in the bed.  His wife reports the patient has become a full assist with any ambulation whatsoever.  She also reports the patient has had both urine and stool incontinence; secondary to inability to get to the bathroom in time.  Patient will be transported to the emergency department for further evaluation and management this afternoon.

## 2015-12-23 ENCOUNTER — Other Ambulatory Visit: Payer: Self-pay | Admitting: Nurse Practitioner

## 2015-12-23 ENCOUNTER — Inpatient Hospital Stay (HOSPITAL_COMMUNITY): Payer: Commercial Managed Care - PPO

## 2015-12-23 ENCOUNTER — Encounter (HOSPITAL_COMMUNITY): Payer: Self-pay | Admitting: Radiology

## 2015-12-23 DIAGNOSIS — J9601 Acute respiratory failure with hypoxia: Secondary | ICD-10-CM

## 2015-12-23 DIAGNOSIS — D61810 Antineoplastic chemotherapy induced pancytopenia: Secondary | ICD-10-CM

## 2015-12-23 DIAGNOSIS — C9000 Multiple myeloma not having achieved remission: Secondary | ICD-10-CM

## 2015-12-23 DIAGNOSIS — E039 Hypothyroidism, unspecified: Secondary | ICD-10-CM

## 2015-12-23 DIAGNOSIS — E871 Hypo-osmolality and hyponatremia: Secondary | ICD-10-CM

## 2015-12-23 DIAGNOSIS — J45901 Unspecified asthma with (acute) exacerbation: Secondary | ICD-10-CM

## 2015-12-23 DIAGNOSIS — K219 Gastro-esophageal reflux disease without esophagitis: Secondary | ICD-10-CM | POA: Insufficient documentation

## 2015-12-23 DIAGNOSIS — R05 Cough: Secondary | ICD-10-CM

## 2015-12-23 DIAGNOSIS — J189 Pneumonia, unspecified organism: Secondary | ICD-10-CM | POA: Insufficient documentation

## 2015-12-23 DIAGNOSIS — R062 Wheezing: Secondary | ICD-10-CM

## 2015-12-23 LAB — COMPREHENSIVE METABOLIC PANEL
ALBUMIN: 3.1 g/dL — AB (ref 3.5–5.0)
ALT: 23 U/L (ref 17–63)
AST: 24 U/L (ref 15–41)
Alkaline Phosphatase: 60 U/L (ref 38–126)
Anion gap: 10 (ref 5–15)
BUN: 14 mg/dL (ref 6–20)
CHLORIDE: 104 mmol/L (ref 101–111)
CO2: 21 mmol/L — ABNORMAL LOW (ref 22–32)
Calcium: 8 mg/dL — ABNORMAL LOW (ref 8.9–10.3)
Creatinine, Ser: 0.95 mg/dL (ref 0.61–1.24)
GFR calc Af Amer: >60 mL/min (ref >60)
GFR calc non Af Amer: >60 mL/min (ref >60)
GLUCOSE: 86 mg/dL (ref 65–99)
POTASSIUM: 3.9 mmol/L (ref 3.5–5.1)
Sodium: 135 mmol/L (ref 135–145)
Total Bilirubin: 0.6 mg/dL (ref 0.3–1.2)
Total Protein: 5.9 g/dL — ABNORMAL LOW (ref 6.5–8.1)

## 2015-12-23 LAB — URINALYSIS, ROUTINE W REFLEX MICROSCOPIC
BILIRUBIN UA: NEGATIVE
Bacteria: NONE SEEN
Bilirubin Urine: NEGATIVE
Casts: NONE SEEN /lpf
Epithelial Cells (non renal): NONE SEEN /hpf (ref 0–10)
GLUCOSE, UA: NEGATIVE mg/dL
GLUCOSE: NEGATIVE
Ketones, UA: NEGATIVE
Ketones, ur: NEGATIVE mg/dL
Leukocytes, UA: NEGATIVE
Leukocytes, UA: NEGATIVE
NITRITE UA: NEGATIVE
Nitrite: NEGATIVE
PH UA: 7 (ref 5.0–7.5)
Protein, ur: NEGATIVE mg/dL
SPEC GRAV UA: 1.016 (ref 1.005–1.030)
SPECIFIC GRAVITY, URINE: 1.01 (ref 1.005–1.030)
UUROB: 0.2 mg/dL (ref 0.2–1.0)
pH: 6 (ref 5.0–8.0)

## 2015-12-23 LAB — URINE MICROSCOPIC-ADD ON: WBC UA: NONE SEEN WBC/hpf (ref 0–5)

## 2015-12-23 LAB — TROPONIN I
TROPONIN I: 0.03 ng/mL (ref <0.031)
TROPONIN I: 0.04 ng/mL — AB (ref <0.031)
Troponin I: 0.03 ng/mL (ref <0.031)

## 2015-12-23 LAB — PROTIME-INR
INR: 1.16 (ref 0.00–1.49)
Prothrombin Time: 14.5 seconds (ref 11.6–15.2)

## 2015-12-23 LAB — URINE CULTURE: Organism ID, Bacteria: NO GROWTH

## 2015-12-23 LAB — CBC WITH DIFFERENTIAL/PLATELET
BASOS ABS: 0 10*3/uL (ref 0.0–0.1)
Basophils Relative: 0 %
Eosinophils Absolute: 0 10*3/uL (ref 0.0–0.7)
Eosinophils Relative: 0 %
HCT: 26 % — ABNORMAL LOW (ref 39.0–52.0)
Hemoglobin: 9.1 g/dL — ABNORMAL LOW (ref 13.0–17.0)
Lymphocytes Relative: 23 %
Lymphs Abs: 0.4 10*3/uL — ABNORMAL LOW (ref 0.7–4.0)
MCH: 33 pg (ref 26.0–34.0)
MCHC: 35 g/dL (ref 30.0–36.0)
MCV: 94.2 fL (ref 78.0–100.0)
MONO ABS: 0.4 10*3/uL (ref 0.1–1.0)
MONOS PCT: 19 %
NEUTROS PCT: 58 %
Neutro Abs: 1.1 10*3/uL — ABNORMAL LOW (ref 1.7–7.7)
PLATELETS: 34 10*3/uL — AB (ref 150–400)
RBC: 2.76 MIL/uL — AB (ref 4.22–5.81)
RDW: 17.9 % — ABNORMAL HIGH (ref 11.5–15.5)
WBC: 1.9 10*3/uL — AB (ref 4.0–10.5)

## 2015-12-23 LAB — TSH: TSH: 0.358 u[IU]/mL (ref 0.350–4.500)

## 2015-12-23 LAB — MAGNESIUM: Magnesium: 2.2 mg/dL (ref 1.7–2.4)

## 2015-12-23 LAB — PROCALCITONIN: Procalcitonin: <0.1 ng/mL

## 2015-12-23 LAB — APTT: aPTT: 36 seconds (ref 24–37)

## 2015-12-23 LAB — CREATININE, URINE, RANDOM: CREATININE, URINE: 41.7 mg/dL

## 2015-12-23 LAB — OSMOLALITY, URINE: OSMOLALITY UR: 290 mosm/kg — AB (ref 300–900)

## 2015-12-23 LAB — SODIUM, URINE, RANDOM: Sodium, Ur: 45 mmol/L

## 2015-12-23 LAB — STREP PNEUMONIAE URINARY ANTIGEN: STREP PNEUMO URINARY ANTIGEN: NEGATIVE

## 2015-12-23 LAB — PHOSPHORUS: PHOSPHORUS: 2.6 mg/dL (ref 2.5–4.6)

## 2015-12-23 MED ORDER — IPRATROPIUM-ALBUTEROL 0.5-2.5 (3) MG/3ML IN SOLN
3.0000 mL | Freq: Four times a day (QID) | RESPIRATORY_TRACT | Status: DC
  Administered 2015-12-23 (×2): 3 mL via RESPIRATORY_TRACT
  Filled 2015-12-23 (×2): qty 3

## 2015-12-23 MED ORDER — PANTOPRAZOLE SODIUM 40 MG PO TBEC
40.0000 mg | DELAYED_RELEASE_TABLET | Freq: Two times a day (BID) | ORAL | Status: DC
  Administered 2015-12-23 – 2015-12-26 (×7): 40 mg via ORAL
  Filled 2015-12-23 (×8): qty 1

## 2015-12-23 MED ORDER — IPRATROPIUM-ALBUTEROL 0.5-2.5 (3) MG/3ML IN SOLN
3.0000 mL | Freq: Three times a day (TID) | RESPIRATORY_TRACT | Status: DC
  Administered 2015-12-24 – 2015-12-26 (×8): 3 mL via RESPIRATORY_TRACT
  Filled 2015-12-23 (×8): qty 3

## 2015-12-23 MED ORDER — IOPAMIDOL (ISOVUE-300) INJECTION 61%
75.0000 mL | Freq: Once | INTRAVENOUS | Status: AC | PRN
  Administered 2015-12-23: 75 mL via INTRAVENOUS

## 2015-12-23 MED ORDER — METHYLPREDNISOLONE SODIUM SUCC 125 MG IJ SOLR
60.0000 mg | Freq: Three times a day (TID) | INTRAMUSCULAR | Status: DC
  Administered 2015-12-23 – 2015-12-25 (×6): 60 mg via INTRAVENOUS
  Filled 2015-12-23 (×7): qty 2

## 2015-12-23 NOTE — Consult Note (Signed)
[place]  [phone] Fax:(336) [phone]     ID: Andre [name] DOB: 1951/06/23  MR#: [id number]  OPF#:[id number]  Patient Care Team: Cassandria Anger, MD as PCP - General (Internal Medicine) Chauncey Cruel, MD as Consulting Physician (Oncology) Concepcion Living, MD as Consulting Physician (Neurology) PCP: [name], MD OTHER NO:[id number] gasparetto MD [facility])  CHIEF COMPLAINT: multiple myeloma  CURRENT TREATMENT: pomalidomide, dexamethasone, bortezomib in preparation for 2d transplant in May     INTERVAL HISTORY: "Andre [name]" had his most recent bortezomib treatment (day 11 cycle 3) 12/17/2015. On 12/19/2015 he was seen in the ED with cough and wheezing and was started on albuterol and levaquin. We saw him in the office 04/03-- CXR was clear, flu screen negative. He was advised to fill the levaquin script. However he worsened over the next 2 days with temp to 100 and increasing cough and SOB. He also felt progressively tired and weak. He was evaluated by our acute-care NP 01/21/2016 and referred to the ED leading to admission. He is being treated empirically with zosyn and vanco and continuing valtrex. He was not on bactrim prophylaxis as outpatient.  REVIEW OF SYSTEMS: Andre [name] is a bit confused this AM. This is not unusual for him-- he has had extensive neurologic evaluation for this here and at [facility] w/o a definitive diagnosis being established. This generally clears and he functions normally (works full time). He is still quite SOB even at rest and has a wet-sounding cough. Fells very weak. Had "good BM" 2 hrs ago. No family in room  MYELOMA:HISTORY: From the original intake nodes:  The patient was worked up for peptic ulcer disease in August of 2011, with significant bleeding and anemia. The patient was Helicobacter pylori negative. He received some epinephrine when he had his EGD and then started on Protonix. [name] anemia slowly  resolved so that by September, his hemoglobin was up to 10.9 and by earlier this month, his hemoglobin was up to 12.5.   As part of his general workup, he was found to have a slightly elevated globulin fraction. In September, his total protein was 8.3 with an albumin of 3.8. In January, the total protein was 8.4 and albumin 3.6. With persistence of this slight abnormality, Dr. Olevia Perches obtained serum immunofixation and SPEP. The SPTP showed an M-spike of 2.67 grams. A total IgG was 4,190. Total IgA low at 28. Total IgM low at 28 also. The immunofixation showed a monoclonal IgG lambda paraprotein. There were also monoclonal free lambda light chains present. With this information, the patient was referred for further evaluation.   A diagnosis of myeloma was confirmed by bone marrow biopsy and subsequebnt treatment is as detailed below  PAST MEDICAL HISTORY: Past Medical History  Diagnosis Date  . Hypertension   . Hyperlipidemia   . Duodenal ulcer   . Allergy   . Thyroid disease   . GERD (gastroesophageal reflux disease)   . Depression   . Anemia   . Hypothyroidism   . Asthma     no treatment x 20 years  . Arthritis   . Double vision     occurs at times   . Multiple myeloma 07/04/2011    PAST SURGICAL HISTORY: Past Surgical History  Procedure Laterality Date  . Cardiolite study  11/25/2003    NORMAL  . Bone marrow transplant  2011    for MM  . Tonsillectomy      FAMILY HISTORY Family History  Problem  Relation Age of Onset  . Throat cancer Mother   . Hypertension Father   . Stroke Father   . Asthma Father   . Diabetes Father   . 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 His wife of 37+ years was [name], but they are now divorced. In 2016 he remarried, his current wife's name is [name]. The patient has two daughters: [name], who  works in [place]. They both live here in [place].  The patient is very close to his daughters. [place] first daughter, Andre [name], born 03/06/2013, apparently has Weber/Osler/Rendu. The patient is not a church attender. He does derive a great deal of support from friends at the "[employer]" which he attends very regularly, and also participates in Al-Anon even though actually there is no alcohol or drug problem in the family. He participates in this group and he gets quite a bit of support from it.     ADVANCED DIRECTIVES: IN PLACE   HEALTH MAINTENANCE: Social History  Substance Use Topics  . Smoking status: Former Smoker -- 3 years    Quit date: 03/27/1969  . Smokeless tobacco: Never Used  . Alcohol Use: No      Allergies  Allergen Reactions  . Atorvastatin Other (See Comments)  . Rosuvastatin     Other reaction(s): Liver Disorder  . Crestor [Rosuvastatin Calcium]     ADVERSE EFFECTS ON LIVER  . Lipitor [Atorvastatin Calcium]     Current Facility-Administered Medications  Medication Dose Route Frequency Provider Last Rate Last Dose  . acetaminophen (TYLENOL) tablet 650 mg  650 mg Oral Q6H PRN Toy Baker, MD       Or  . acetaminophen (TYLENOL) suppository 650 mg  650 mg Rectal Q6H PRN Toy Baker, MD      . albuterol (PROVENTIL) (2.5 MG/3ML) 0.083% nebulizer solution 2.5 mg  2.5 mg Nebulization Q2H PRN Toy Baker, MD   2.5 mg at 12/23/15 0653  . aspirin tablet 325 mg  325 mg Oral Daily Toy Baker, MD      . escitalopram (LEXAPRO) tablet 10 mg  10 mg Oral Daily Toy Baker, MD      . guaiFENesin (MUCINEX) 12 hr tablet 600 mg  600 mg Oral BID Toy Baker, MD   600 mg at 12/22/15 2337  . HYDROcodone-acetaminophen (NORCO/VICODIN) 5-325 MG per tablet 1-2 tablet  1-2 tablet Oral Q4H PRN Toy Baker, MD   1 tablet at 12/22/15 2313  . ipratropium (ATROVENT) nebulizer solution 0.5 mg  0.5 mg Nebulization Q6H Toy Baker, MD   0.5 mg at 12/23/15 0359  . levothyroxine (SYNTHROID, LEVOTHROID) tablet 150 mcg  150 mcg Oral QAC breakfast Toy Baker, MD      . loratadine (CLARITIN) tablet 10 mg  10 mg Oral Daily Toy Baker, MD      . LORazepam (ATIVAN) tablet 1 mg  1 mg Oral Once Jeryl Columbia, NP   Stopped at 12/23/15 0631  . metoCLOPramide (REGLAN) tablet 5 mg  5 mg Oral Q6H PRN Toy Baker, MD   5 mg at 12/22/15 2313  . ondansetron (ZOFRAN) tablet 4 mg  4 mg Oral Q6H PRN Toy Baker, MD       Or  . ondansetron (ZOFRAN) injection 4 mg  4 mg Intravenous Q6H PRN Toy Baker, MD      . piperacillin-tazobactam (  ZOSYN) IVPB 3.375 g  3.375 g Intravenous Q8H Charlesetta Shanks, MD   3.375 g at 12/22/15 2321  . polyethylene glycol (MIRALAX / GLYCOLAX) packet 17 g  17 g Oral Daily PRN Toy Baker, MD      . predniSONE (DELTASONE) tablet 40 mg  40 mg Oral Q breakfast Toy Baker, MD      . senna (SENOKOT) tablet 8.6 mg  1 tablet Oral BID Toy Baker, MD   8.6 mg at 12/22/15 2337  . sodium chloride flush (NS) 0.9 % injection 3 mL  3 mL Intravenous Q12H Toy Baker, MD   3 mL at 12/22/15 2337  . valACYclovir (VALTREX) tablet 1,000 mg  1,000 mg Oral Daily Toy Baker, MD      . vancomycin (VANCOCIN) IVPB 1000 mg/200 mL premix  1,000 mg Intravenous Q12H Charlesetta Shanks, MD   1,000 mg at 12/23/15 0630  . zolpidem (AMBIEN) tablet 5 mg  5 mg Oral QHS PRN Toy Baker, MD   5 mg at 12/22/15 2313    OBJECTIVE: middle aged [place] male examined in bed Filed Vitals:   12/23/15 0148 12/23/15 0505  BP: 132/68 126/71  Pulse: 72 64  Temp: 98.3 F (36.8 C) 98.3 F (36.8 C)  Resp: 16 18     There is no weight on file to calculate BMI.      Ocular: Sclerae unicteric, EOMs intact Lymphatic: No cervical or supraclavicular adenopathy Lungs bilateral wheezes and rales Heart regular rate and rhythm, no murmur appreciated Abd soft, nontender, positive  bowel sounds Neuro: non-focal, A&O x3, appropriate affect   LAB RESULTS:  CMP     Component Value Date/Time   NA 135 12/23/2015 0434   NA 130* 12/22/2015 1328   K 3.9 12/23/2015 0434   K 4.1 12/22/2015 1328   CL 104 12/23/2015 0434   CL 107 03/10/2013 1352   CO2 21* 12/23/2015 0434   CO2 23 12/22/2015 1328   GLUCOSE 86 12/23/2015 0434   GLUCOSE 91 12/22/2015 1328   GLUCOSE 99 03/10/2013 1352   BUN 14 12/23/2015 0434   BUN 16.0 12/22/2015 1328   CREATININE 0.95 12/23/2015 0434   CREATININE 1.2 12/22/2015 1328   CALCIUM 8.0* 12/23/2015 0434   CALCIUM 8.8 12/22/2015 1328   PROT 5.9* 12/23/2015 0434   PROT 6.6 12/22/2015 1328   PROT 6.6 12/07/2015 1111   ALBUMIN 3.1* 12/23/2015 0434   ALBUMIN 3.2* 12/22/2015 1328   AST 24 12/23/2015 0434   AST 22 12/22/2015 1328   ALT 23 12/23/2015 0434   ALT 24 12/22/2015 1328   ALKPHOS 60 12/23/2015 0434   ALKPHOS 77 12/22/2015 1328   BILITOT 0.6 12/23/2015 0434   BILITOT 0.36 12/22/2015 1328   GFRNONAA >60 12/23/2015 0434   GFRAA >60 12/23/2015 0434    INo results found for: SPEP, UPEP  Lab Results  Component Value Date   WBC 1.9* 12/23/2015   NEUTROABS 1.1* 12/23/2015   HGB 9.1* 12/23/2015   HCT 26.0* 12/23/2015   MCV 94.2 12/23/2015   PLT 34* 12/23/2015    _0 @  No results found for: LABCA2  No components found for: LABCA125   Recent Labs Lab 12/23/15 0029  INR 1.16    Urinalysis    Component Value Date/Time   COLORURINE YELLOW 12/23/2015 0220   APPEARANCEUR CLEAR 12/23/2015 0220   LABSPEC 1.010 12/23/2015 0220   PHURINE 6.0 12/23/2015 0220   GLUCOSEU NEGATIVE 12/23/2015 0220   GLUCOSEU NEGATIVE 07/08/2015 1050   HGBUR MODERATE* 12/23/2015  [place] 12/23/2015 0220   KETONESUR NEGATIVE 12/23/2015 0220   PROTEINUR NEGATIVE 12/23/2015 0220   UROBILINOGEN 0.2 07/08/2015 1050   NITRITE NEGATIVE 12/23/2015 0220   LEUKOCYTESUR NEGATIVE 12/23/2015 0220     STUDIES: Dg Chest  2 View  12/22/2015  CLINICAL DATA:  Worsening fatigue and confusion.  Neutropenia. EXAM: CHEST  2 VIEW COMPARISON:  12/19/2015 FINDINGS: The heart size and mediastinal contours are within normal limits. Both lungs are clear. The visualized skeletal structures are unremarkable. IMPRESSION: No active cardiopulmonary disease. Electronically Signed   By: Andreas Newport M.D.   On: 12/22/2015 18:26   Dg Chest 2 View  12/19/2015  CLINICAL DATA:  Shortness of breath and cold symptoms, initial encounter EXAM: CHEST  2 VIEW COMPARISON:  07/08/2015 FINDINGS: Cardiac shadow is within normal limits. The lungs are clear bilaterally. Bony structures are within normal limits. IMPRESSION: No active cardiopulmonary disease. Electronically Signed   By: Inez Catalina M.D.   On: 12/19/2015 21:11    ASSESSMENT: 65 y.o. [place] man with a history of multiple myeloma diagnosed February of 2012, with an initial M-spike of 2.6 g/dL, IFE showing an IgG lambda paraprotein and free lambda light chains in the urine. Cytogenetics showed trisomy 52. Bone marrow biopsy showed a 22% plasmacytosis. Treated with   (1) bortezomib (subcutaneously), lenalidomide, and dexamethasone, with repeat bone marrow biopsy May of 2012 showing 10% plasmacytosis   (2) high-dose chemotherapy with BCNU and melphalan at [facility], followed by stem cell rescue July of 2012   (3) on zoledronic acid started December of 2012, initially monthly, currently given every 3 months, most recent dose 02/17/2014  (4) low-dose lenalidomide resumed April 2013, interrupted several times since. Resumed again on 02/19/2013, and a dose of 5 mg daily, 21 days on and 7 days off, later further reduced to 7 days on, 7 days off  (5) CNS symptoms and abnormal brain MRI extensively evaluated by neurology with no definitive diagnosis established; improving   (6) rising M spike noted June 2014 but did not meet criteria for carfilzomib study  (7) on lenalidomide 25 mg daily, 14  days on, 7 days off, starting 04/18/2013, interrupted December 2014 because of rash;  (a) resumed January 2015 at 10 mg/ day at 21 days on/ 7 days off ([place]) starting 08/18/2014 decreasedto 10 mg/ day 14 days on, 7 days off because of cytopenias (c) as of February 2016 he has been on 5 mg daily 7 days on 7 days off (d) lenalidomide discontinued December 2016 with evidence of disease progression  (8) transient global amnesia 05/29/2015, resolved without intervention  (9) started PVD 10/25/2015 w ASA 325 thromboprophylaxis, valacyclovir prophylaxis (a) pomalidomide 4 mg/d days 1-14 ([place]) bortezomib sQ days 2,5,9,12 of each 21 day cycle (c) dexamethasome 20 mg two days a week  PLAN:  I am not sure what is going on w [name]'s lungs-- PCP is a possibility. I will write for a chest CT and agree w Dr Algis Liming that a PUL consult will be helpful. Also agree w Dr Janelle Floor plan for steroids in this pt who has been on 20 mg dexamethasone 2 days a week for the past 2 months.  Andre [name] has a living will but I do not see an irreversible terminal problem here and he should be full code. Have changed the order.  No problem interrupting his myeloma therapy for now. Will obtain SPEP in AM. He has an appt at [facility] to plan for his transplant scheduled for  sometime in May.  Greatly appreciate your help to this patient!     The patient has a good understanding of the overall plan. She agrees with it. She knows the goal of treatment in her case is cure. She will call with any problems that may develop before her next visit here.  Chauncey Cruel, MD   12/23/2015 8:08 AM Medical Oncology and Hematology [facility] [address] [place], [place] [address] Tel. [phone]    Fax. [phone]

## 2015-12-23 NOTE — Consult Note (Signed)
Name: Andre [name] MRN: [id number] DOB: 1950-11-18    ADMISSION DATE:  12/22/2015 CONSULTATION DATE:  12/23/15  REFERRING MD :  Dr. Algis Liming / [facility]   CHIEF COMPLAINT:  Wheezing    HISTORY OF PRESENT ILLNESS:  65 y/o M, former smoker (quit in his 20's, approx 5 years, occasional cigars in his 30's/40's), works as a Chief Strategy Officer,  with PMH of HTN, HLD, GERD, duodenal ulcer, hypothyroidism, depression, anemia, asthma, and multiple myeloma s/p bortezomib treatment (day 11 cycle 3) 12/17/2015 and waiting bone marrow transplant who presented to [facility] on 12/22/15 with cough and wheezing.    The patient was seen in the ER on 4/2 with cough and wheezing and was started on levaquin and albuterol.  He followed up in the [place] office on 4/3 with a clear chest xray and negative flu screen.  He was advised to fill the levaquin script.  Unfortunately, he had two days of worsening symptoms with fever to 100, increased cough, shortness of breath, fatigue and weakness.  He was seen again in the [place] office on 4/5 and referred to [facility] for evaluation.     Initial labs - Na 126, K 3.5, Cl 101, CO2 18, sr cr 1.05, glucose 123, albumin 3.1, lactic acid 1.1, PCT < 0.10, troponin 0.03, WBC 2.0, hgb 9.1, HCT 9.1, and platelets 34. Repeat CXR on 4/5 continued to be negative for airspace disease.    PCCM consulted for evaluation of wheezing.    Currently, the patient reports he feels his breathing is improved.  He notes ongoing non-productive cough and mild tightness with breathing.  The patient also reports increased reflux symptoms in the last few weeks with chest burning and acid.    PAST MEDICAL HISTORY :   has a past medical history of Hypertension; Hyperlipidemia; Duodenal ulcer; Allergy; Thyroid disease; GERD (gastroesophageal reflux disease); Depression; Anemia; Hypothyroidism; Asthma; Arthritis; Double vision; and Multiple myeloma (07/04/2011).  has past surgical history that includes CARDIOLITE STUDY (11/25/2003); Bone  marrow transplant (2011); and Tonsillectomy.   Prior to Admission medications   Medication Sig Start Date End Date Taking? Authorizing Provider  aspirin 325 MG tablet Take 325 mg by mouth daily.   Yes Historical Provider, MD  Bisacodyl (DULCOLAX PO) Take 1 tablet by mouth daily as needed (for constipation). Reported on 12/07/2015   Yes Historical Provider, MD  cetirizine (ZYRTEC) 10 MG tablet Take 10 mg by mouth daily.   Yes Historical Provider, MD  cholecalciferol (VITAMIN D) 1000 UNITS tablet Take 1 tablet (1,000 Units total) by mouth daily. 03/09/15  Yes Aleksei [name] V, MD  dexamethasone (DECADRON) 6 MG tablet Take 3 tablets by mouth with food as directed Patient taking differently: Take 18 mg by mouth as directed. Takes 3 tablets (18 mg) on Tues and Fri on Days 1-14. Repeat Every 21 Days. 10/15/15  Yes Chauncey Cruel, MD  escitalopram (LEXAPRO) 10 MG tablet Take 1 tablet (10 mg total) by mouth daily. 10/07/15  Yes Aleksei [name] V, MD  ketoconazole (NIZORAL) 2 % cream Apply 1 application topically daily. Patient taking differently: Apply 1 application topically daily as needed for irritation.  10/29/15  Yes Chauncey Cruel, MD  levofloxacin (LEVAQUIN) 500 MG tablet Take 500 mg by mouth daily. 10 Day Course. ABT Start Date 12/20/15 & End Date 12/29/15.   Yes Historical Provider, MD  levothyroxine (SYNTHROID, LEVOTHROID) 150 MCG tablet Take 1 tablet by mouth  daily 07/08/15  Yes Aleksei [name] V, MD  polyethylene glycol (MIRALAX /  GLYCOLAX) packet Take 17 g by mouth daily as needed for mild constipation.    Yes Historical Provider, MD  senna (SENOKOT) 8.6 MG tablet Take 1 tablet by mouth 2 (two) times daily. Reported on 12/07/2015   Yes Historical Provider, MD  valACYclovir (VALTREX) 1000 MG tablet Take 1 tablet (1,000 mg total) by mouth daily. 10/22/15  Yes Chauncey Cruel, MD  zolpidem (AMBIEN) 5 MG tablet Take 1-2 tablets (5-10 mg total) by mouth at bedtime as needed for sleep. 10/22/15   Yes Chauncey Cruel, MD  Bortezomib (VELCADE IJ) Inject 1 Dose as directed as directed. Inject on Tues and Fri on Days 1-14. Repeat Every 21 Days.    Historical Provider, MD  metoCLOPramide (REGLAN) 5 MG tablet Take 1 tablet (5 mg total) by mouth every 6 (six) hours as needed for nausea. 11/23/15   Chauncey Cruel, MD  pantoprazole (PROTONIX) 40 MG tablet Take 1 tablet (40 mg total) by mouth daily. 12/14/15   Chauncey Cruel, MD  pomalidomide (POMALYST) 4 MG capsule Take 1 capsule by mouth with water daily on days 1-14. Repeat every 21 days. 12/02/15   Nicholas Lose, MD   Allergies  Allergen Reactions  . Atorvastatin Other (See Comments)  . Rosuvastatin     Other reaction(s): Liver Disorder  . Crestor [Rosuvastatin Calcium]     ADVERSE EFFECTS ON LIVER  . Lipitor [Atorvastatin Calcium]     FAMILY HISTORY:  family history includes Asthma in his father; Diabetes in his father; Hypertension in his father; Pancreatic cancer in his brother; Stroke in his father; Throat cancer in his mother.   SOCIAL HISTORY:  reports that he quit smoking about 46 years ago. He has never used smokeless tobacco. He reports that he does not drink alcohol or use illicit drugs.  REVIEW OF SYSTEMS:  POSITIVES ARE IN BOLD Constitutional: Negative for fever, chills, weight loss, malaise/fatigue and diaphoresis.  HENT: Negative for hearing loss, ear pain, nosebleeds, congestion, sore throat, neck pain, tinnitus and ear discharge.   Eyes: Negative for blurred vision, double vision, photophobia, pain, discharge and redness.  Respiratory: Negative for cough, hemoptysis, sputum production, shortness of breath, wheezing and stridor.   Cardiovascular: Negative for chest pain, palpitations, orthopnea, claudication, leg swelling and PND.  Gastrointestinal: Negative for heartburn, nausea, vomiting, abdominal pain, diarrhea, constipation, blood in stool and melena.  Genitourinary: Negative for dysuria, urgency, frequency,  hematuria and flank pain.  Musculoskeletal: Negative for myalgias, back pain, joint pain and falls.  Skin: Negative for itching and rash.  Neurological: Negative for dizziness, tingling, tremors, sensory change, speech change, focal weakness, seizures, loss of consciousness, weakness and headaches.  Endo/Heme/Allergies: Negative for environmental allergies and polydipsia. Does not bruise/bleed easily.  SUBJECTIVE:   VITAL SIGNS: Temp:  [98.3 F (36.8 C)-99.2 F (37.3 C)] 98.3 F (36.8 C) (04/06 0505) Pulse Rate:  [64-83] 64 (04/06 0505) Resp:  [16-25] 18 (04/06 0505) BP: (126-164)/(68-94) 126/71 mmHg (04/06 0505) SpO2:  [90 %-100 %] 97 % (04/06 0505)  PHYSICAL EXAMINATION: General:  Fatigued appearing adult male in NAD  Neuro:  AAOx4, speech clear, MAE  HEENT:  MM pink/moist, no jvd Cardiovascular:  s1s2 distant, regular  Lungs:  Mild tachypnea, non-labored, wheezing on R with basilar crackles, clear on L Abdomen:  Soft, non-tender, bsx4 active  Musculoskeletal:  No acute deformities  Skin:  Warm/dry, no edema    Recent Labs Lab 12/19/15 2143 12/22/15 1328 12/22/15 1956 12/23/15 0434  NA 135 130* 126* 135  K 4.2 4.1 3.5 3.9  CL 104  --  101 104  CO2 24 23 18* 21*  BUN 24* 16.0 17 14  CREATININE 1.19 1.2 1.05 0.95  GLUCOSE 104* 91 123* 86    Recent Labs Lab 12/22/15 1326 12/22/15 1956 12/23/15 0434  HGB 10.0* 9.1* 9.1*  HCT 28.8* 26.3* 26.0*  WBC 2.4* 2.0* 1.9*  PLT 39* 38* 34*   Dg Chest 2 View  12/22/2015  CLINICAL DATA:  Worsening fatigue and confusion.  Neutropenia. EXAM: CHEST  2 VIEW COMPARISON:  12/19/2015 FINDINGS: The heart size and mediastinal contours are within normal limits. Both lungs are clear. The visualized skeletal structures are unremarkable. IMPRESSION: No active cardiopulmonary disease. Electronically Signed   By: Andreas Newport M.D.   On: 12/22/2015 18:26     SIGNIFICANT EVENTS  4/05  Admit with SOB, wheezing, cough, fatigue /  weakness  STUDIES:  CT Chest 4/5 >>   ANTIBIOTICS: Zosyn 4/5 >>  Vanco 4/5 >>   CULTURES: Sputum 4/5 >>   ASSESSMENT / PLAN:   Cough  Wheezing - hx of prior asthma, not on medications in the last 20 years.  ? If GERD has aggravated asthma symptoms Hx Asthma - suspect acute flare SOB - resolved   Plan: Pulmonary hygiene - IS / mobilize as able Agree with CT chest to evaluate parenchyma, r/o LAN Maximum GERD Rx Agree with solumedrol for bronchospasm  Duoneb Q6 with PRN albuterol  Oxygen as needed to support sats > 92% Monitor intermittent CXR  Claritin QD Continue antibiotics for now  Await sputum culture (if patient able)   Multiple Myeloma   Plan: Per ONC / Primary SVC   All other medical issues per [place], NP-C [facility] Pgr: [phone] or if no answer [phone] 12/23/2015, 9:24 AM   PCCM Attending Note: Patient seen and examined with nurse practitioner. Please refer to her consult note which I have reviewed in detail. Patient currently with history of asthma/COPD on treatment for multiple myeloma presenting with sore throat, sinus congestion and subsequent wheezing. Patient does do worse low-grade fevers as well as fatigue and weakness suggestive of a viral etiology. Of note patient also is having ongoing issues with significant reflux.  BP 126/71 mmHg  Pulse 64  Temp(Src) 97.6 F (36.4 C) (Oral)  Resp 20  Ht 5' 10"  (1.778 m)  Wt 185 lb 3.2 oz (84.006 kg)  BMI 26.57 kg/m2  SpO2 97% Gen.: Laying in bed. No distress. Wife at bedside. Integument: Warm and dry. No rash on exposed skin. Pulmonary: Mild diffuse end expiratory wheezing. Speaking in complete sentences. Normal work of breathing. Abdomen: Soft. Not distended. Nontender.  CBC Latest Ref Rng 12/23/2015 12/22/2015 12/22/2015  WBC 4.0 - 10.5 K/uL 1.9(L) 2.0(L) 2.4(L)  Hemoglobin 13.0 - 17.0 g/dL 9.1(L) 9.1(L) 10.0(L)  Hematocrit 39.0 - 52.0 % 26.0(L) 26.3(L) 28.8(L)    Platelets 150 - 400 K/uL 34(L) 38(L) 39(L)    BMP Latest Ref Rng 12/23/2015 12/22/2015 12/22/2015  Glucose 65 - 99 mg/dL 86 123(H) 91  BUN 6 - 20 mg/dL 14 17 16.0  Creatinine 0.61 - 1.24 mg/dL 0.95 1.05 1.2  Sodium 135 - 145 mmol/L 135 126(L) 130(L)  Potassium 3.5 - 5.1 mmol/L 3.9 3.5 4.1  Chloride 101 - 111 mmol/L 104 101 -  CO2 22 - 32 mmol/L 21(L) 18(L) 23  Calcium 8.9 - 10.3 mg/dL 8.0(L) 7.6(L) 8.26    A/P: 65 year old male with known underlying asthma/COPD  presenting with wheezing and viral prodrome. Influenza PCR was negative but given the nodularity on his CT imaging which could be consistent with a viral process I have ordered a respiratory viral panel PCR. Certainly his underlying reflux is likely contributing to some of water we are appreciating that his wheezing.  1. Viral URI/pneumonitis: Respiratory viral panel PCR pending. 2. Possible healthcare associated pneumonia: Agree with continuing broad-spectrum antibiotic therapy with Zosyn & vancomycin for now. Would consider tapering to Zosyn alone if patient's MRSA PCR screen is negative. 3. Asthma/COPD exacerbation: Agree with continuing Solu-Medrol IV every 8 hours for now. Recommend transitioning to prednisone 60 mg daily in the next 24-48 hours. Agree with continuing Duonebs every 6 hours for now. 4. GERD: Currently on Protonix twice daily. If patient continues to have symptoms recommend considering the addition of Pepcid/Zantac daily at bedtime to his regimen.  Sonia Baller Ashok Cordia, M.D. [facility] Pager:  [phone] After 3pm or if no response, call [phone] 7:44 PM 12/23/2015

## 2015-12-23 NOTE — Progress Notes (Signed)
PROGRESS NOTE    Andre [name]  NOB:[id number]  DOB: 11/02/1950  DOA: 12/22/2015 PCP: [name], MD Outpatient Specialists: Oncologist: Dr. [name] Magrinat Neurologist: Dr. Vickey Sages [facility] [place] course: 65 year old male with history of multiple myeloma diagnosed Feb 2012, on chemotherapy, awaiting bone marrow transplantation, CNS symptoms and abnormal brain MRI extensively evaluated by neurology without definitive diagnosis and improved, TGA, former smoker, HTN, HLD, GERD, duodenal ulcer, hypothyroidism, depression, anemia, asthma presented to [facility] on 12/22/15 with complaints of cough and wheezing. Seen in ED on 4/2 for similar symptoms and treated with levofloxacin and albuterol, follow-up in oncology office 4/3 included negative chest x-ray and flu screen, noted to have progressively worsening symptoms, seen again in oncology office 4/5 and referred for admission. In the ED, workup significant for sodium 126, bicarbonate 18, normal lactate, PCT <0.1, hemoglobin 9.1, platelets 31 and repeat chest x-ray without acute findings. Admitted for URTI and asthma exacerbation. Oncologist consulted and requesting CT chest due to unclear etiology for respiratory symptoms. Pulmonology consulted due to severity of symptoms.   Assessment & Plan:   Asthma exacerbation - Likely precipitated by pneumonia which was not seen on initial chest x-ray 4/5 but revealed on CT chest 4/6. - Continue treatment with IV antibiotics (vancomycin & Zosyn), changed steroids to IV Solu-Medrol, scheduled and when necessary bronchodilator nebulizations, aggressive pulmonary toilet and twice a day PPI for possible GERD as cause of flareup. - Pulmonology consulted. - Monitor closely and if he decompensates, transfer to ICU and alert CCM.  Healthcare associated pneumonia - Not seen on chest x-ray 4/5 but noted on CT chest with contrast on 4/6. - Continue empiric IV vancomycin and Zosyn pending culture  results.  Acute respiratory failure with hypoxia - Secondary to pneumonia and asthma exacerbation. - Treatment as above and oxygen supplementation. - Minimally elevated troponin likely secondary to demand ischemia from same.  Hyponatremia - Possibly from dehydration. Resolved.  Pancytopenia  - Secondary to multiple myeloma and related treatment. ANC: 1.1 - Stable.   Multiple myeloma - Oncologist input appreciated. Discussed with Dr. [name]. Patient has appointment at [facility] to plan for his transplant scheduled for sometime in May.  Hypothyroidism - Continue Synthroid.  Acute encephalopathy - Likely related to acute illness. As per discussion with oncology, patient has been evaluated extensively for prior such episodes by neurology without definitive diagnosis.    DVT prophylaxis:  SCDs Code Status: patient was admitted as DNR. However oncologist indicated that patient is full code and was changed to same on 4/6. Family Communication: None at bedside. Disposition Plan: DC home when stable.   Consultants:  Medical oncology  Pulmonology  Procedures:   None  Antimicrobials:  IV Zosyn 4/5 >  IV vancomycin 4/5 >  Valacyclovir 4/6 >   Subjective: Seen this morning. Stated that he was doing better however looked to be mildly dyspneic and wheezing.  Objective: Filed Vitals:   12/23/15 0403 12/23/15 0505 12/23/15 1317 12/23/15 1500  BP:  126/71    Pulse:  64    Temp:  98.3 F (36.8 C)  97.6 F (36.4 C)  TempSrc:  Oral  Oral  Resp:  18  20  Height:      SpO2: 100% 97% 97% 97%    Intake/Output Summary (Last 24 hours) at 12/23/15 1533 Last data filed at 12/23/15 1000  Gross per 24 hour  Intake    240 ml  Output   1000 ml  Net   -760 ml  There were no vitals filed for this visit.  Exam:  General exam: Pleasant middle-aged male lying comfortably propped up in bed with mild increased work of breathing. Respiratory system: Reduced breath sounds bilaterally  with scattered bilateral medium pitched expiratory rhonchi and occasional basal crackles. Mild increased work of breathing. Cardiovascular system: S1 & S2 heard, RRR. No JVD, murmurs, gallops, clicks or pedal edema. Telemetry: SR. Gastrointestinal system: Abdomen is nondistended, soft and nontender. Normal bowel sounds heard. Central nervous system: Alert and oriented to self and place. No focal neurological deficits. Extremities: Symmetric 5 x 5 power.   Data Reviewed: Basic Metabolic Panel:  Recent Labs Lab 12/19/15 2143 12/22/15 1328 12/22/15 1956 12/23/15 0434  NA 135 130* 126* 135  K 4.2 4.1 3.5 3.9  CL 104  --  101 104  CO2 24 23 18* 21*  GLUCOSE 104* 91 123* 86  BUN 24* 16.0 17 14  CREATININE 1.19 1.2 1.05 0.95  CALCIUM 8.9 8.8 7.6* 8.0*  MG  --   --   --  2.2  PHOS  --   --   --  2.6   Liver Function Tests:  Recent Labs Lab 12/19/15 2143 12/22/15 1328 12/22/15 1956 12/23/15 0434  AST _0 ALT _1 ALKPHOS 66 77 63 60  BILITOT 0.3 0.36 0.6 0.6  PROT 5.8* 6.6 6.0* 5.9*  ALBUMIN 3.3* 3.2* 3.1* 3.1*   No results for input(s): LIPASE, AMYLASE in the last 168 hours. No results for input(s): AMMONIA in the last 168 hours. CBC:  Recent Labs Lab 12/17/15 1031 12/19/15 2143 12/22/15 1326 12/22/15 1956 12/23/15 0434  WBC 4.1 3.2* 2.4* 2.0* 1.9*  NEUTROABS 3.3 1.7 1.7 1.5* 1.1*  HGB 10.4* 10.2* 10.0* 9.1* 9.1*  HCT 31.0* 29.4* 28.8* 26.3* 26.0*  MCV 96.6 95.5 94.7 94.6 94.2  PLT 63* 50* 39* 38* 34*   Cardiac Enzymes:  Recent Labs Lab 12/23/15 0029 12/23/15 0434 12/23/15 1054  TROPONINI 0.03 0.03 0.04*   BNP (last 3 results) No results for input(s): PROBNP in the last 8760 hours. CBG: No results for input(s): GLUCAP in the last 168 hours.  No results found for this or any previous visit (from the past 240 hour(s)).       Studies: Dg Chest 2 View  12/22/2015  CLINICAL DATA:  Worsening fatigue and confusion.  Neutropenia. EXAM:  CHEST  2 VIEW COMPARISON:  12/19/2015 FINDINGS: The heart size and mediastinal contours are within normal limits. Both lungs are clear. The visualized skeletal structures are unremarkable. IMPRESSION: No active cardiopulmonary disease. Electronically Signed   By: Andreas Newport M.D.   On: 12/22/2015 18:26   Ct Chest W Contrast  12/23/2015  CLINICAL DATA:  Worsening nonproductive cough, wheezing, and shortness of breath. Fever. Multiple myeloma undergoing chemotherapy. Former smoker. EXAM: CT CHEST WITH CONTRAST TECHNIQUE: Multidetector CT imaging of the chest was performed during intravenous contrast administration. CONTRAST:  53m ISOVUE-300 IOPAMIDOL (ISOVUE-300) INJECTION 61% COMPARISON:  PET-CT on 04/01/2013 FINDINGS: Mediastinum/Lymph Nodes: Mildly enlarged right hilar lymph node is seen measuring 13 mm in short axis on image 40/series 2. No other pathologically enlarged lymph nodes identified. Heart size is normal. Coronary artery calcification noted. Lungs/Pleura: New patchy areas of airspace disease are seen in both lower lobes as well as other small poorly defined nodular densities in the lower lobes and the left upper lobe. These findings are suspicious for infectious or inflammatory process. No evidence of pleural effusion. Upper abdomen:  Tiny calcified gallstone seen, without evidence acute cholecystitis. Musculoskeletal: No focal lytic bone lesions identified. Mild T12 vertebral body compression fracture is seen which is new since 2014. IMPRESSION: New patchy areas of airspace disease and ill-defined nodular densities in both lungs, suspicious for infectious or inflammatory process. Consider short-term followup by CT in 3 months. New mild right hilar lymphadenopathy, which may be reactive in etiology. Recommend continued attention on follow-up CT. Mild T12 vertebral body compression fracture which is new since 2014 exam. Electronically Signed   By: Earle Gell M.D.   On: 12/23/2015 11:11         Scheduled Meds: . aspirin  325 mg Oral Daily  . escitalopram  10 mg Oral Daily  . guaiFENesin  600 mg Oral BID  . ipratropium-albuterol  3 mL Nebulization Q6H  . levothyroxine  150 mcg Oral QAC breakfast  . loratadine  10 mg Oral Daily  . LORazepam  1 mg Oral Once  . methylPREDNISolone (SOLU-MEDROL) injection  60 mg Intravenous 3 times per day  . pantoprazole  40 mg Oral BID AC  . piperacillin-tazobactam (ZOSYN)  IV  3.375 g Intravenous Q8H  . senna  1 tablet Oral BID  . sodium chloride flush  3 mL Intravenous Q12H  . valACYclovir  1,000 mg Oral Daily  . vancomycin  1,000 mg Intravenous Q12H   Continuous Infusions:   Active Problems:   Multiple myeloma (HCC)   Hypothyroidism   Thrombocytopenia (HCC)   Asthma exacerbation   Hyponatremia    Time spent: 45 minutes.    Vernell Leep, MD, FACP, FHM. [facility] Pager [phone]  If 7PM-7AM, please contact night-coverage [email or web address] Password [email or web address] 12/23/2015, 3:33 PM    LOS: 1 day

## 2015-12-24 LAB — CBC WITH DIFFERENTIAL/PLATELET
BASOS PCT: 0 %
Basophils Absolute: 0 10*3/uL (ref 0.0–0.1)
EOS PCT: 0 %
Eosinophils Absolute: 0 10*3/uL (ref 0.0–0.7)
HEMATOCRIT: 26.9 % — AB (ref 39.0–52.0)
HEMOGLOBIN: 9.5 g/dL — AB (ref 13.0–17.0)
Lymphocytes Relative: 11 %
Lymphs Abs: 0.2 10*3/uL — ABNORMAL LOW (ref 0.7–4.0)
MCH: 32.8 pg (ref 26.0–34.0)
MCHC: 35.3 g/dL (ref 30.0–36.0)
MCV: 92.8 fL (ref 78.0–100.0)
MONO ABS: 0.3 10*3/uL (ref 0.1–1.0)
MONOS PCT: 15 %
NEUTROS PCT: 74 %
Neutro Abs: 1.6 10*3/uL — ABNORMAL LOW (ref 1.7–7.7)
Platelets: 41 10*3/uL — ABNORMAL LOW (ref 150–400)
RBC: 2.9 MIL/uL — AB (ref 4.22–5.81)
RDW: 17.7 % — ABNORMAL HIGH (ref 11.5–15.5)
WBC: 2.1 10*3/uL — ABNORMAL LOW (ref 4.0–10.5)

## 2015-12-24 LAB — MRSA PCR SCREENING: MRSA by PCR: NEGATIVE

## 2015-12-24 NOTE — Consult Note (Signed)
Name: Andre [name] MRN: [id number] DOB: 07-13-1951    ADMISSION DATE:  12/22/2015 CONSULTATION DATE:  12/23/15  REFERRING MD :  Dr. Algis Liming / [facility]   CHIEF COMPLAINT:  Wheezing    HISTORY OF PRESENT ILLNESS:  65 y/o M, former smoker (quit in his 20's, approx 5 years, occasional cigars in his 30's/40's), works as a Chief Strategy Officer,  with PMH of HTN, HLD, GERD, duodenal ulcer, hypothyroidism, depression, anemia, asthma, and multiple myeloma s/p bortezomib treatment (day 11 cycle 3) 12/17/2015 and waiting bone marrow transplant who presented to [facility] on 12/22/15 with cough and wheezing.    The patient was seen in the ER on 4/2 with cough and wheezing and was started on levaquin and albuterol.  He followed up in the [place] office on 4/3 with a clear chest xray and negative flu screen.  He was advised to fill the levaquin script.  Unfortunately, he had two days of worsening symptoms with fever to 100, increased cough, shortness of breath, fatigue and weakness.  He was seen again in the [place] office on 4/5 and referred to [facility] for evaluation.     Initial labs - Na 126, K 3.5, Cl 101, CO2 18, sr cr 1.05, glucose 123, albumin 3.1, lactic acid 1.1, PCT < 0.10, troponin 0.03, WBC 2.0, hgb 9.1, HCT 9.1, and platelets 34. Repeat CXR on 4/5 continued to be negative for airspace disease.    PCCM consulted for evaluation of wheezing.    Currently, the patient reports he feels his breathing is improved.  He notes ongoing non-productive cough and mild tightness with breathing.  The patient also reports increased reflux symptoms in the last few weeks with chest burning and acid.    SUBJECTIVE: Patient reports cough, wheezing, and dyspnea improving. Did ambulate in the hallway. Denies any nausea, vomiting, or abdominal pain.  REVIEW OF SYSTEMS: No subjective fever, chills, or sweats. No chest pain or pressure.  VITAL SIGNS: Temp:  [97.5 F (36.4 C)-98.1 F (36.7 C)] 98.1 F (36.7 C) (04/07 1627) Pulse Rate:  [59-81] 78  (04/07 1627) Resp:  [18-20] 20 (04/07 1627) BP: (120-135)/(60-82) 120/70 mmHg (04/07 1627) SpO2:  [92 %-98 %] 94 % (04/07 1627)  PHYSICAL EXAMINATION: General:  Awake. Alert. Wife at bedside.  Integument:  Warm & dry. No rash on exposed skin. No bruising on exposed skin. HEENT:  Moist mucus membranes. No oral ulcers. No scleral injection or icterus.  Cardiovascular:  Regular rate. No edema. No appreciable JVD.  Pulmonary:  Improving bilateral wheeze. Good aeration bilaterally. Normal work of breathing. Abdomen: Soft. Normal bowel sounds. Nondistended. Grossly nontender.   Recent Labs Lab 12/19/15 2143 12/22/15 1328 12/22/15 1956 12/23/15 0434  NA 135 130* 126* 135  K 4.2 4.1 3.5 3.9  CL 104  --  101 104  CO2 24 23 18* 21*  BUN 24* 16.0 17 14  CREATININE 1.19 1.2 1.05 0.95  GLUCOSE 104* 91 123* 86    Recent Labs Lab 12/22/15 1956 12/23/15 0434 12/24/15 0420  HGB 9.1* 9.1* 9.5*  HCT 26.3* 26.0* 26.9*  WBC 2.0* 1.9* 2.1*  PLT 38* 34* 41*   Dg Chest 2 View  12/22/2015  CLINICAL DATA:  Worsening fatigue and confusion.  Neutropenia. EXAM: CHEST  2 VIEW COMPARISON:  12/19/2015 FINDINGS: The heart size and mediastinal contours are within normal limits. Both lungs are clear. The visualized skeletal structures are unremarkable. IMPRESSION: No active cardiopulmonary disease. Electronically Signed   By: Valerie Roys.D.  On: 12/22/2015 18:26   Ct Chest W Contrast  12/23/2015  CLINICAL DATA:  Worsening nonproductive cough, wheezing, and shortness of breath. Fever. Multiple myeloma undergoing chemotherapy. Former smoker. EXAM: CT CHEST WITH CONTRAST TECHNIQUE: Multidetector CT imaging of the chest was performed during intravenous contrast administration. CONTRAST:  19m ISOVUE-300 IOPAMIDOL (ISOVUE-300) INJECTION 61% COMPARISON:  PET-CT on 04/01/2013 FINDINGS: Mediastinum/Lymph Nodes: Mildly enlarged right hilar lymph node is seen measuring 13 mm in short axis on image 40/series 2.  No other pathologically enlarged lymph nodes identified. Heart size is normal. Coronary artery calcification noted. Lungs/Pleura: New patchy areas of airspace disease are seen in both lower lobes as well as other small poorly defined nodular densities in the lower lobes and the left upper lobe. These findings are suspicious for infectious or inflammatory process. No evidence of pleural effusion. Upper abdomen: Tiny calcified gallstone seen, without evidence acute cholecystitis. Musculoskeletal: No focal lytic bone lesions identified. Mild T12 vertebral body compression fracture is seen which is new since 2014. IMPRESSION: New patchy areas of airspace disease and ill-defined nodular densities in both lungs, suspicious for infectious or inflammatory process. Consider short-term followup by CT in 3 months. New mild right hilar lymphadenopathy, which may be reactive in etiology. Recommend continued attention on follow-up CT. Mild T12 vertebral body compression fracture which is new since 2014 exam. Electronically Signed   By: JEarle GellM.D.   On: 12/23/2015 11:11     SIGNIFICANT EVENTS  4/05  Admit with SOB, wheezing, cough, fatigue / weakness  STUDIES:  CT Chest 4/6: Patchy bilateral ill-defined nodular areas with groundglass. No pathologic mediastinal or hilar adenopathy. T12 compression fracture noted as new since 2014. No pleural effusion or thickening.  ANTIBIOTICS: Zosyn 4/5 >>  Vanco 4/5 >>   MICROBIOLOGY: Respiratory Viral Panel PCR 4/6>>> Influenza PCR 4/2:  Negative Blood Ctx x1 4/5>>> Urine Ctx 4/5:  Negative MRSA PCR 4/7:  Negative    ASSESSMENT / PLAN:  65year old male with known underlying asthma/COPD presenting with wheezing and viral prodrome. Infectious workup remains pending. Patient clinically improving on his current antibiotic regimen. His asthma exacerbation seems to be improving as well. With continued clinical improvement I am doubtful the patient has underlying  PJP.  1. Viral URI/pneumonitis: Respiratory viral panel PCR pending. 2. Possible HCAP: Recommend considering discontinuing vancomycin with negative MRSA PCR & blood culture negative to date. I would recommend a 7 day course of Zosyn empirically. 3. Asthma/COPD exacerbation: Recommend transitioning to prednisone 60 mg daily tomorrow. Agree with continuing Duonebs 3 times a day. 4. GERD: Currently on Protonix twice daily. If patient continues to have symptoms recommend considering the addition of Pepcid/Zantac daily at bedtime to his regimen.  We will reassess the patient on 4/10. Please contact the rounding physician if there are any questions or acute changes over the weekend.  JSonia BallerNAshok Cordia M.D. LM[facility] HospitalPulmonary & Critical Care Pager:  [phone] 3pm or if no response, call [phone] PM 12/24/2015

## 2015-12-24 NOTE — Progress Notes (Signed)
Andre [name]   DOB:Mar 01, 1951   VA#:[id number]   THY#:[id number]  Subjective: Andre [name] is mentally recovered today-- back to his usual baseline; had a "good" BM, walked in hall a short distance w walker and PT, less weak but still weak and SOB; coughing some, not very productive; wife in room   Objective: middle aged White man examined in bed Filed Vitals:   12/24/15 0820 12/24/15 1200  BP: 129/69 121/80  Pulse: 65 81  Temp: 97.5 F (36.4 C) 97.7 F (36.5 C)  Resp: 20 20    Body mass index is 26.57 kg/(m^2).  Intake/Output Summary (Last 24 hours) at 12/24/15 1246 Last data filed at 12/24/15 1201  Gross per 24 hour  Intake   1510 ml  Output   1250 ml  Net    260 ml     Sclerae unicteric  No peripheral adenopathy  Lungs clear -- no wheezes today  Heart regular rate and rhythm  Abdomen benign  Neuro nonfocal   CBG (last 3)  No results for input(s): GLUCAP in the last 72 hours.   Labs:  Lab Results  Component Value Date   WBC 2.1* 12/24/2015   HGB 9.5* 12/24/2015   HCT 26.9* 12/24/2015   MCV 92.8 12/24/2015   PLT 41* 12/24/2015   NEUTROABS 1.6* 12/24/2015    @LASTCHEMISTRY @  Urine Studies No results for input(s): UHGB, CRYS in the last 72 hours.  Invalid input(s): UACOL, UAPR, USPG, UPH, UTP, UGL, UKET, UBIL, UNIT, UROB, Andre [name], UEPI, UWBC, RIDGELY, Andre [name], [place]  Basic Metabolic Panel:  Recent Labs Lab 12/19/15 2143 12/22/15 1328 12/22/15 1956 12/23/15 0434  NA 135 130* 126* 135  K 4.2 4.1 3.5 3.9  CL 104  --  101 104  CO2 24 23 18* 21*  GLUCOSE 104* 91 123* 86  BUN 24* 16.0 17 14  CREATININE 1.19 1.2 1.05 0.95  CALCIUM 8.9 8.8 7.6* 8.0*  MG  --   --   --  2.2  PHOS  --   --   --  2.6   GFR Estimated Creatinine Clearance: 79 mL/min (by C-G formula based on Cr of 0.95). Liver Function Tests:  Recent Labs Lab 12/19/15 2143 12/22/15 1328 12/22/15 1956 12/23/15 0434  AST 26 22 25 24   ALT 31 24 24 23   ALKPHOS 66 77 63 60  BILITOT 0.3  0.36 0.6 0.6  PROT 5.8* 6.6 6.0* 5.9*  ALBUMIN 3.3* 3.2* 3.1* 3.1*   No results for input(s): LIPASE, AMYLASE in the last 168 hours. No results for input(s): AMMONIA in the last 168 hours. Coagulation profile  Recent Labs Lab 12/23/15 0029  INR 1.16    CBC:  Recent Labs Lab 12/19/15 2143 12/22/15 1326 12/22/15 1956 12/23/15 0434 12/24/15 0420  WBC 3.2* 2.4* 2.0* 1.9* 2.1*  NEUTROABS 1.7 1.7 1.5* 1.1* 1.6*  HGB 10.2* 10.0* 9.1* 9.1* 9.5*  HCT 29.4* 28.8* 26.3* 26.0* 26.9*  MCV 95.5 94.7 94.6 94.2 92.8  PLT 50* 39* 38* 34* 41*   Cardiac Enzymes:  Recent Labs Lab 12/23/15 0029 12/23/15 0434 12/23/15 1054  TROPONINI 0.03 0.03 0.04*   BNP: Invalid input(s): POCBNP CBG: No results for input(s): GLUCAP in the last 168 hours. D-Dimer No results for input(s): DDIMER in the last 72 hours. Hgb A1c No results for input(s): HGBA1C in the last 72 hours. Lipid Profile No results for input(s): CHOL, HDL, LDLCALC, TRIG, CHOLHDL, LDLDIRECT in the last 72 hours. Thyroid function studies  Recent Labs  12/23/15 0434  TSH 0.358   Anemia work up No results for input(s): VITAMINB12, FOLATE, FERRITIN, TIBC, IRON, RETICCTPCT in the last 72 hours. Microbiology Recent Results (from the past 240 hour(s))  Urine Culture     Status: None   Collection Time: 12/22/15  1:28 PM  Result Value Ref Range Status   Urine Culture, Routine Final report  Final   Urine Culture result 1 No growth  Final  Culture, Blood     Status: None (Preliminary result)   Collection Time: 12/22/15  1:29 PM  Result Value Ref Range Status   BLOOD CULTURE, ROUTINE Preliminary report  Preliminary   RESULT 1 Comment  Preliminary    Comment: No growth in 36 - 48 hours.      Studies:  Dg Chest 2 View  12/22/2015  CLINICAL DATA:  Worsening fatigue and confusion.  Neutropenia. EXAM: CHEST  2 VIEW COMPARISON:  12/19/2015 FINDINGS: The heart size and mediastinal contours are within normal limits. Both lungs  are clear. The visualized skeletal structures are unremarkable. IMPRESSION: No active cardiopulmonary disease. Electronically Signed   By: Andreas Newport M.D.   On: 12/22/2015 18:26   Ct Chest W Contrast  12/23/2015  CLINICAL DATA:  Worsening nonproductive cough, wheezing, and shortness of breath. Fever. Multiple myeloma undergoing chemotherapy. Former smoker. EXAM: CT CHEST WITH CONTRAST TECHNIQUE: Multidetector CT imaging of the chest was performed during intravenous contrast administration. CONTRAST:  52m ISOVUE-300 IOPAMIDOL (ISOVUE-300) INJECTION 61% COMPARISON:  PET-CT on 04/01/2013 FINDINGS: Mediastinum/Lymph Nodes: Mildly enlarged right hilar lymph node is seen measuring 13 mm in short axis on image 40/series 2. No other pathologically enlarged lymph nodes identified. Heart size is normal. Coronary artery calcification noted. Lungs/Pleura: New patchy areas of airspace disease are seen in both lower lobes as well as other small poorly defined nodular densities in the lower lobes and the left upper lobe. These findings are suspicious for infectious or inflammatory process. No evidence of pleural effusion. Upper abdomen: Tiny calcified gallstone seen, without evidence acute cholecystitis. Musculoskeletal: No focal lytic bone lesions identified. Mild T12 vertebral body compression fracture is seen which is new since 2014. IMPRESSION: New patchy areas of airspace disease and ill-defined nodular densities in both lungs, suspicious for infectious or inflammatory process. Consider short-term followup by CT in 3 months. New mild right hilar lymphadenopathy, which may be reactive in etiology. Recommend continued attention on follow-up CT. Mild T12 vertebral body compression fracture which is new since 2014 exam. Electronically Signed   By: JEarle GellM.D.   On: 12/23/2015 11:11    Assessment: 65y.o. [place] man with a history of multiple myeloma diagnosed February of 2012, with an initial M-spike of 2.6  g/dL, IFE showing an IgG lambda paraprotein and free lambda light chains in the urine. Cytogenetics showed trisomy 160 Bone marrow biopsy showed a 22% plasmacytosis.  Now admitted 12/22/2015 with pneumonia/ pneumonitis  (1) bortezomib (subcutaneously), lenalidomide, and dexamethasone, with repeat bone marrow biopsy May of 2012 showing 10% plasmacytosis   (2) [facility] July of 2012   (3) on zoledronic acid started December of 2012, initially monthly, currently given every 3 months, most recent dose 02/17/2014  (4) low-dose lenalidomide resumed April 2013, interrupted several times since. Resumed again on 02/19/2013, and a dose of 5 mg daily, 21 days on and 7 days off, later further reduced to 7 days on, 7 days off  (5) CNS symptoms and abnormal  brain MRI extensively evaluated by neurology with no definitive diagnosis established; improving   (6) rising M spike noted June 2014 but did not meet criteria for carfilzomib study  (7) on lenalidomide 25 mg daily, 14 days on, 7 days off, starting 04/18/2013, interrupted December 2014 because of rash;  (a) resumed January 2015 at 10 mg/ day at 21 days on/ 7 days off (Panfilo) starting 08/18/2014 decreasedto 10 mg/ day 14 days on, 7 days off because of cytopenias (c) as of February 2016 he has been on 5 mg daily 7 days on 7 days off (d) lenalidomide discontinued December 2016 with evidence of disease progression  (8) transient global amnesia 05/29/2015, resolved without intervention  (9) started PVD 10/25/2015 w ASA 325 thromboprophylaxis, valacyclovir prophylaxis-- last dose 12/17/2015 (a) pomalidomide 4 mg/d days 1-14 (Emani) bortezomib sQ days 2,5,9,12 of each 21 day cycle (c) dexamethasome 20 mg two days a week  (10) bilateral ASD by CT scan 12/23/2015-- currently on day 3 antibacterial and day  2antivirals (but was on vatrex prophylaxis) , day 2 steroids  PLAN:  Andre [name] appears somewhat improved today. He was able to take some steps with a walker and PT helping. He continues on zosyn/ vanco/ valtrex. I am requesting a sputum sample for PCP and discussed w Andre [name] how to obtain it w/o contamination. SPEP and other myeloma studies were drawn this AM and will be ready early next week in preparation for his appointment at [facility] April 12 to discuss transplantaton.  CBC is recovering, now 7 days off myeloma treatment.  If there is no significant improvement over the weekend wonder if BAL would be helpful in narrowing the possibilities.  I will be away this weekend but please consult my partners if we can be of help.   Chauncey Cruel, MD 12/24/2015  12:46 PM Medical Oncology and Hematology [facility] [address], [place] [address] Tel. [phone]    Fax. [phone]

## 2015-12-24 NOTE — Evaluation (Signed)
Physical Therapy Evaluation Patient Details Name: Andre [name] MRN: [id number] DOB: 06/30/1951 Today's Date: 12/24/2015   History of Present Illness  65 y.o. male with h/o DM, OA, anxiety, back surgery, TKA, multiple myeloma s/p bone marrow transplant now awaiting another transplant, admitted with cough, fever. Dx of PNA, pneumonitis.   Clinical Impression  Pt admitted with above diagnosis. Pt currently with functional limitations due to the deficits listed below (see PT Problem List). Pt ambulated 43' with RW, SaO2 95% on RA, HR 104, distance limited by fatigue. Encouraged pt to ambulate daily. HHPT, 3 in 1, and 4 wheeled walker recommended.  Pt will benefit from skilled PT to increase their independence and safety with mobility to allow discharge to the venue listed below.       Follow Up Recommendations Home health PT    Equipment Recommendations  3in1 (PT);Other (comment) (4 wheeled rolling walker)    Recommendations for Other Services       Precautions / Restrictions Precautions Precautions: Fall Precaution Comments: 1 fall PTA Restrictions Weight Bearing Restrictions: No      Mobility  Bed Mobility Overal bed mobility: Modified Independent             General bed mobility comments: with rail  Transfers Overall transfer level: Needs assistance Equipment used: Rolling walker (2 wheeled) Transfers: Sit to/from Stand Sit to Stand: Supervision         General transfer comment: verbal cues hand placement  Ambulation/Gait Ambulation/Gait assistance: Supervision Ambulation Distance (Feet): 60 Feet Assistive device: Rolling walker (2 wheeled) Gait Pattern/deviations: WFL(Within Functional Limits)   Gait velocity interpretation: Below normal speed for age/gender General Gait Details: steady with RW, SaO2 95% on RA, HR 104, 3/4 dyspnea, distance limited by fatigue  Stairs            Wheelchair Mobility    Modified Rankin (Stroke Patients Only)        Balance Overall balance assessment: Modified Independent                                           Pertinent Vitals/Pain Pain Assessment: No/denies pain    Home Living Family/patient expects to be discharged to:: Private residence Living Arrangements: Spouse/significant other Available Help at Discharge: Family Type of Home: House Home Access: Stairs to enter   Technical brewer of Steps: 4 Home Layout: Able to live on main level with bedroom/bathroom Home Equipment: Shower seat - built in;Hand held Veterinary surgeon - single point      Prior Function Level of Independence: Independent with assistive device(s)         Comments: has been using cane just recently     Hand Dominance        Extremity/Trunk Assessment   Upper Extremity Assessment: Overall WFL for tasks assessed           Lower Extremity Assessment: Overall WFL for tasks assessed      Cervical / Trunk Assessment: Normal  Communication   Communication: No difficulties  Cognition Arousal/Alertness: Awake/alert Behavior During Therapy: WFL for tasks assessed/performed Overall Cognitive Status: Within Functional Limits for tasks assessed                      General Comments      Exercises        Assessment/Plan    PT Assessment Patient needs continued PT  services  PT Diagnosis Difficulty walking;Generalized weakness   PT Problem List Decreased activity tolerance;Decreased mobility;Cardiopulmonary status limiting activity  PT Treatment Interventions DME instruction;Gait training;Stair training;Functional mobility training;Therapeutic activities;Patient/family education;Therapeutic exercise   PT Goals (Current goals can be found in the Care Plan section) Acute Rehab PT Goals Patient Stated Goal: to be able to walk farther, return to work  PT Goal Formulation: With patient/family Time For Goal Achievement: 01/07/16 Potential to Achieve Goals: Good     Frequency Min 3X/week   Barriers to discharge        Co-evaluation               End of Session Equipment Utilized During Treatment: Gait belt Activity Tolerance: Patient tolerated treatment well Patient left: in chair;with call bell/phone within reach;with chair alarm set;with family/visitor present Nurse Communication: Mobility status         Time: 5502-7142 PT Time Calculation (min) (ACUTE ONLY): 30 min   Charges:   PT Evaluation $PT Eval Low Complexity: 1 Procedure PT Treatments $Gait Training: 8-22 mins   PT G Codes:        Dar[place], [place] 12/24/2015, 10:38 AM [phone]

## 2015-12-24 NOTE — Progress Notes (Signed)
Pt and wife at bedside declined HHPT.

## 2015-12-24 NOTE — Progress Notes (Addendum)
PROGRESS NOTE    Andre [name]  WUJ:[id number]  DOB: 1951/07/19  DOA: 12/22/2015 PCP: [name], MD Outpatient Specialists: Oncologist: Dr. [name] Magrinat Neurologist: Dr. Vickey Sages [facility] course: 65 year old male with history of multiple myeloma diagnosed Feb 2012, on chemotherapy, awaiting bone marrow transplantation, CNS symptoms and abnormal brain MRI extensively evaluated by neurology without definitive diagnosis and improved, TGA, former smoker, HTN, HLD, GERD, duodenal ulcer, hypothyroidism, depression, anemia, asthma presented to [facility] on 12/22/15 with complaints of cough and wheezing. Seen in ED on 4/2 for similar symptoms and treated with levofloxacin and albuterol, follow-up in oncology office 4/3 included negative chest x-ray and flu screen, noted to have progressively worsening symptoms, seen again in oncology office 4/5 and referred for admission. In the ED, workup significant for sodium 126, bicarbonate 18, normal lactate, PCT <0.1, hemoglobin 9.1, platelets 31 and repeat chest x-ray without acute findings. Admitted for URTI and asthma exacerbation. Oncologist consulted and requested CT chest which revealed findings suspicious for pneumonia. Pulmonology consulted due to severity of symptoms. Slightly improved.   Assessment & Plan:   Asthma/COPD exacerbation - Likely precipitated by viral URI/pneumonitis complicated by pneumonia which was not seen on initial chest x-ray 4/5 but revealed on CT chest 4/6. - Continue treatment with IV antibiotics (vancomycin & Zosyn), changed steroids to IV Solu-Medrol, scheduled and when necessary bronchodilator nebulizations, aggressive pulmonary toilet and twice a day PPI for possible GERD as cause of flareup. - Pulmonology consultation appreciated: Suspecting viral URI/pneumonitis complicated by HCAP-RSV panel pending.. - Monitor closely and if he decompensates, transfer to ICU and alert CCM. - Slowly  improving.  Healthcare associated pneumonia - Not seen on chest x-ray 4/5 but noted on CT chest with contrast on 4/6. - Continue empiric IV vancomycin and Zosyn pending culture results. Check MRSA PCR screen and if negative, DC vancomycin. - ? Consideration for PCP-discussed with pulmonology team.? Add Bactrim.  Acute respiratory failure with hypoxia - Secondary to pneumonia and asthma exacerbation. - Treatment as above and oxygen supplementation. - Minimally elevated troponin likely secondary to demand ischemia from same. - Improving.  Hyponatremia - Possibly from dehydration. Resolved.  Pancytopenia  - Secondary to multiple myeloma and related treatment. ANC: 1.1 - Stable.   Multiple myeloma - Oncologist input appreciated. Discussed with Dr. [name]. Patient has appointment at [facility] to plan for his transplant scheduled for sometime in May. On valacyclovir per oncology.  Hypothyroidism - Continue Synthroid.  Acute encephalopathy - Likely related to acute illness. As per discussion with oncology, patient has been evaluated extensively for prior such episodes by neurology without definitive diagnosis.  - Mental status seems to have improved. No overt confusion this morning.  GERD - Twice a day PPI.   DVT prophylaxis:  SCDs Code Status: patient was admitted as DNR. However oncologist indicated that patient is full code and was changed to same on 4/6. Family Communication: None at bedside. Disposition Plan: DC home when stable.   Consultants:  Medical oncology  Pulmonology  Procedures:   None  Antimicrobials:  IV Zosyn 4/5 >  IV vancomycin 4/5 >  Valacyclovir 4/6 >   Subjective: States that his dyspnea is better but still had some on exertion. As per RN, ambulated to the bathroom and maintained good oxygen saturations on room air. No confusion reported.  Objective: Filed Vitals:   12/24/15 0848 12/24/15 0927 12/24/15 1034 12/24/15 1200  BP:    121/80   Pulse:    81  Temp:  97.7 F (36.5 C)  TempSrc:    Oral  Resp:    20  Height:      Weight:      SpO2: 94% 93% 95% 93%    Intake/Output Summary (Last 24 hours) at 12/24/15 1239 Last data filed at 12/24/15 1201  Gross per 24 hour  Intake   1510 ml  Output   1250 ml  Net    260 ml   Filed Weights   12/23/15 1500  Weight: 84.006 kg (185 lb 3.2 oz)    Exam:  General exam: Pleasant middle-aged male lying comfortably propped up in bed. Looks better than he did yesterday. Respiratory system: Improved breath sounds. Slightly harsh. Few bilateral medium pitched expiratory rhonchi but less compared to yesterday. No increased work of breathing. Cardiovascular system: S1 & S2 heard, RRR. No JVD, murmurs, gallops, clicks or pedal edema.  Gastrointestinal system: Abdomen is nondistended, soft and nontender. Normal bowel sounds heard. Central nervous system: Alert and oriented to self and place. No focal neurological deficits. Extremities: Symmetric 5 x 5 power.   Data Reviewed: Basic Metabolic Panel:  Recent Labs Lab 12/19/15 2143 12/22/15 1328 12/22/15 1956 12/23/15 0434  NA 135 130* 126* 135  K 4.2 4.1 3.5 3.9  CL 104  --  101 104  CO2 24 23 18* 21*  GLUCOSE 104* 91 123* 86  BUN 24* 16.0 17 14  CREATININE 1.19 1.2 1.05 0.95  CALCIUM 8.9 8.8 7.6* 8.0*  MG  --   --   --  2.2  PHOS  --   --   --  2.6   Liver Function Tests:  Recent Labs Lab 12/19/15 2143 12/22/15 1328 12/22/15 1956 12/23/15 0434  AST 26 22 25 24   ALT 31 24 24 23   ALKPHOS 66 77 63 60  BILITOT 0.3 0.36 0.6 0.6  PROT 5.8* 6.6 6.0* 5.9*  ALBUMIN 3.3* 3.2* 3.1* 3.1*   No results for input(s): LIPASE, AMYLASE in the last 168 hours. No results for input(s): AMMONIA in the last 168 hours. CBC:  Recent Labs Lab 12/19/15 2143 12/22/15 1326 12/22/15 1956 12/23/15 0434 12/24/15 0420  WBC 3.2* 2.4* 2.0* 1.9* 2.1*  NEUTROABS 1.7 1.7 1.5* 1.1* 1.6*  HGB 10.2* 10.0* 9.1* 9.1* 9.5*  HCT 29.4* 28.8*  26.3* 26.0* 26.9*  MCV 95.5 94.7 94.6 94.2 92.8  PLT 50* 39* 38* 34* 41*   Cardiac Enzymes:  Recent Labs Lab 12/23/15 0029 12/23/15 0434 12/23/15 1054  TROPONINI 0.03 0.03 0.04*   BNP (last 3 results) No results for input(s): PROBNP in the last 8760 hours. CBG: No results for input(s): GLUCAP in the last 168 hours.  Recent Results (from the past 240 hour(s))  Urine Culture     Status: None   Collection Time: 12/22/15  1:28 PM  Result Value Ref Range Status   Urine Culture, Routine Final report  Final   Urine Culture result 1 No growth  Final  Culture, Blood     Status: None (Preliminary result)   Collection Time: 12/22/15  1:29 PM  Result Value Ref Range Status   BLOOD CULTURE, ROUTINE Preliminary report  Preliminary   RESULT 1 Comment  Preliminary    Comment: No growth in 36 - 48 hours.         Studies: Dg Chest 2 View  12/22/2015  CLINICAL DATA:  Worsening fatigue and confusion.  Neutropenia. EXAM: CHEST  2 VIEW COMPARISON:  12/19/2015 FINDINGS: The heart size and mediastinal contours are  within normal limits. Both lungs are clear. The visualized skeletal structures are unremarkable. IMPRESSION: No active cardiopulmonary disease. Electronically Signed   By: Andreas Newport M.D.   On: 12/22/2015 18:26   Ct Chest W Contrast  12/23/2015  CLINICAL DATA:  Worsening nonproductive cough, wheezing, and shortness of breath. Fever. Multiple myeloma undergoing chemotherapy. Former smoker. EXAM: CT CHEST WITH CONTRAST TECHNIQUE: Multidetector CT imaging of the chest was performed during intravenous contrast administration. CONTRAST:  18m ISOVUE-300 IOPAMIDOL (ISOVUE-300) INJECTION 61% COMPARISON:  PET-CT on 04/01/2013 FINDINGS: Mediastinum/Lymph Nodes: Mildly enlarged right hilar lymph node is seen measuring 13 mm in short axis on image 40/series 2. No other pathologically enlarged lymph nodes identified. Heart size is normal. Coronary artery calcification noted. Lungs/Pleura: New  patchy areas of airspace disease are seen in both lower lobes as well as other small poorly defined nodular densities in the lower lobes and the left upper lobe. These findings are suspicious for infectious or inflammatory process. No evidence of pleural effusion. Upper abdomen: Tiny calcified gallstone seen, without evidence acute cholecystitis. Musculoskeletal: No focal lytic bone lesions identified. Mild T12 vertebral body compression fracture is seen which is new since 2014. IMPRESSION: New patchy areas of airspace disease and ill-defined nodular densities in both lungs, suspicious for infectious or inflammatory process. Consider short-term followup by CT in 3 months. New mild right hilar lymphadenopathy, which may be reactive in etiology. Recommend continued attention on follow-up CT. Mild T12 vertebral body compression fracture which is new since 2014 exam. Electronically Signed   By: JEarle GellM.D.   On: 12/23/2015 11:11        Scheduled Meds: . aspirin  325 mg Oral Daily  . escitalopram  10 mg Oral Daily  . guaiFENesin  600 mg Oral BID  . ipratropium-albuterol  3 mL Nebulization TID  . levothyroxine  150 mcg Oral QAC breakfast  . loratadine  10 mg Oral Daily  . methylPREDNISolone (SOLU-MEDROL) injection  60 mg Intravenous 3 times per day  . pantoprazole  40 mg Oral BID AC  . piperacillin-tazobactam (ZOSYN)  IV  3.375 g Intravenous Q8H  . senna  1 tablet Oral BID  . sodium chloride flush  3 mL Intravenous Q12H  . valACYclovir  1,000 mg Oral Daily  . vancomycin  1,000 mg Intravenous Q12H   Continuous Infusions:   Active Problems:   Multiple myeloma (HCC)   Hypothyroidism   Thrombocytopenia (HCC)   Asthma exacerbation   Hyponatremia   HCAP (healthcare-associated pneumonia)   Esophageal reflux    Time spent: 25 minutes.    HVernell Leep MD, FACP, FHM. [facility] Pager [phone] If 7PM-7AM, please contact night-coverage [email or web address] Password  [email or web address] 12/24/2015, 12:39 PM    LOS: 2 days

## 2015-12-25 DIAGNOSIS — D696 Thrombocytopenia, unspecified: Secondary | ICD-10-CM

## 2015-12-25 LAB — RESPIRATORY VIRUS PANEL
ADENOVIRUS: NEGATIVE
INFLUENZA B 1: NEGATIVE
Influenza A: NEGATIVE
METAPNEUMOVIRUS: POSITIVE — AB
PARAINFLUENZA 1 A: NEGATIVE
PARAINFLUENZA 3 A: NEGATIVE
Parainfluenza 2: NEGATIVE
RHINOVIRUS: NEGATIVE
Respiratory Syncytial Virus A: NEGATIVE
Respiratory Syncytial Virus B: NEGATIVE

## 2015-12-25 MED ORDER — PREDNISONE 50 MG PO TABS
50.0000 mg | ORAL_TABLET | Freq: Every day | ORAL | Status: DC
  Administered 2015-12-25 – 2015-12-26 (×2): 50 mg via ORAL
  Filled 2015-12-25 (×2): qty 1

## 2015-12-25 NOTE — Progress Notes (Signed)
Pharmacy Antibiotic Note  Andre [name] is a 65 y.o. male with multiple myeloma admitted from the [facility] on 12/22/2015 with possible pneumonia and sepsis after failed outpatient therapy with Levaquin. Pharmacy has been consulted for Vancomycin and Zosyn dosing.  Plan: No change to Zosyn 3.375gm q8hr - 4 hr infusion CCM recommends total 7 days abx (Zosyn) Vancomycin discontinued  Height: _0  (177.8 cm) Weight: 185 lb 3.2 oz (84.006 kg) IBW/kg (Calculated) : 73  Temp (24hrs), Avg:98 F (36.7 C), Min:97.7 F (36.5 C), Max:98.5 F (36.9 C)   Recent Labs Lab 12/19/15 2143 12/22/15 1326 12/22/15 1328 12/22/15 1745 12/22/15 1956 12/22/15 1957 12/23/15 0434 12/24/15 0420  WBC 3.2* 2.4*  --   --  2.0*  --  1.9* 2.1*  CREATININE 1.19  --  1.2  --  1.05  --  0.95  --   LATICACIDVEN  --   --   --  1.12  --  1.1  --   --     Estimated Creatinine Clearance: 79 mL/min (by C-G formula based on Cr of 0.95).    Allergies  Allergen Reactions  . Atorvastatin Other (See Comments)  . Rosuvastatin     Other reaction(s): Liver Disorder  . Crestor [Rosuvastatin Calcium]     ADVERSE EFFECTS ON LIVER  . Lipitor [Atorvastatin Calcium]    Antimicrobials this admission: 4/5 >> Vancomycin >> 4/8 4/5 >> Zosyn >>  Microbiology results: 4/2 Infl panel: neg 4/5 BCx: ngtd 4/5 UCx: ng-final 4/6: Strep pneumo Ag: neg 4/2 flu: neg 4/6 resp virus panel: + Metapneumovirus  Thank you for allowing pharmacy to be a part of this patient's care.  Minda Ditto PharmD Pager [phone] 12/25/2015, 11:28 AM

## 2015-12-25 NOTE — Progress Notes (Signed)
PROGRESS NOTE    Andre [name]  XFQ:[id number]  DOB: [birth year]  DOA: 12/22/2015 PCP: [name], MD Outpatient Specialists: Oncologist: Dr. [name] Magrinat Neurologist: Dr. Vickey Sages [facility] course: 65 year old male with history of multiple myeloma diagnosed Feb 2012, on chemotherapy, awaiting bone marrow transplantation, CNS symptoms and abnormal brain MRI extensively evaluated by neurology without definitive diagnosis and improved, TGA, former smoker, HTN, HLD, GERD, duodenal ulcer, hypothyroidism, depression, anemia, asthma presented to [facility] on 12/22/15 with complaints of cough and wheezing. Seen in ED on 4/2 for similar symptoms and treated with levofloxacin and albuterol, follow-up in oncology office 4/3 included negative chest x-ray and flu screen, noted to have progressively worsening symptoms, seen again in oncology office 4/5 and referred for admission. In the ED, workup significant for sodium 126, bicarbonate 18, normal lactate, PCT <0.1, hemoglobin 9.1, platelets 31 and repeat chest x-ray without acute findings. Admitted for URTI and asthma exacerbation. Oncologist consulted and requested CT chest which revealed findings suspicious for pneumonia. Pulmonology consulted due to severity of symptoms. Improving..   Assessment & Plan:   Asthma/COPD exacerbation - Likely precipitated by viral URI/pneumonitis complicated by pneumonia which was not seen on initial chest x-ray 4/5 but revealed on CT chest 4/6. - Continue treatment with IV antibiotics (vancomycin & Zosyn), changed steroids to IV Solu-Medrol, scheduled and when necessary bronchodilator nebulizations, aggressive pulmonary toilet and twice a day PPI for possible GERD as cause of flareup. - Pulmonology consultation appreciated: Suspecting viral URI/pneumonitis complicated by HCAP-RSV panel pending.. - Monitor closely and if he decompensates, transfer to ICU and alert CCM. - Continues to improve.  Discontinued vancomycin (MRSA PCR negative). Changed steroids to oral.  Healthcare associated pneumonia - Not seen on chest x-ray 4/5 but noted on CT chest with contrast on 4/6. - Continue empiric IV vancomycin and Zosyn pending culture results. Check MRSA PCR screen and if negative, DC vancomycin. - MRSA PCR negative and hence vancomycin discontinued  - As per pulmonology, not suspecting PCP. Sputum for PCP could not be sent. RSV panel positive for Metapneumovirus  Acute respiratory failure with hypoxia - Secondary to pneumonia and asthma exacerbation. - Treatment as above and oxygen supplementation. - Minimally elevated troponin likely secondary to demand ischemia from same. - Improving. Wean off of oxygen as long as he saturates >92%.  Hyponatremia - Possibly from dehydration. Resolved.  Pancytopenia  - Secondary to multiple myeloma and related treatment. ANC: 1.1 - Stable.   Multiple myeloma - Oncologist input appreciated. Discussed with Dr. [name]. Patient has appointment at [facility] to plan for his transplant scheduled for sometime in May. On valacyclovir per oncology.  Hypothyroidism - Continue Synthroid.  Acute encephalopathy - Likely related to acute illness. As per discussion with oncology, patient has been evaluated extensively for prior such episodes by neurology without definitive diagnosis.  - Resolved.  GERD - Twice a day PPI.   DVT prophylaxis:  SCDs Code Status: patient was admitted as DNR. However oncologist indicated that patient is full code and was changed to same on 4/6. Family Communication: None at bedside. Disposition Plan: DC home when stable, possibly 4/9.   Consultants:  Medical oncology  Pulmonology  Procedures:   None  Antimicrobials:  IV Zosyn 4/5 >  IV vancomycin 4/5 >  Valacyclovir 4/6 >   Subjective: States that he continues to feel much better. No dyspnea at rest. Has not ambulated much today to describe on exertion. No  chest pain. Intermittent cough but decreasing. As  per RN, no acute issues.  Objective: Filed Vitals:   12/24/15 1956 12/24/15 2036 12/25/15 0523 12/25/15 0740  BP: 128/79  132/72   Pulse: 75  65   Temp: 97.8 F (36.6 C)  98.5 F (36.9 C)   TempSrc: Oral  Oral   Resp: 20  20   Height:      Weight:      SpO2: 95% 93% 97% 96%    Intake/Output Summary (Last 24 hours) at 12/25/15 1331 Last data filed at 12/25/15 0855  Gross per 24 hour  Intake    250 ml  Output   1325 ml  Net  -1075 ml   Filed Weights   12/23/15 1500  Weight: 84.006 kg (185 lb 3.2 oz)    Exam:  General exam: Patient sitting comfortably in chair this morning. Looks much improved compared to 48 hours ago. Respiratory system: Clear to auscultation. No increased work of breathing. Cardiovascular system: S1 & S2 heard, RRR. No JVD, murmurs, gallops, clicks or pedal edema.  Gastrointestinal system: Abdomen is nondistended, soft and nontender. Normal bowel sounds heard. Central nervous system: Alert and oriented to self and place. No focal neurological deficits. Extremities: Symmetric 5 x 5 power.   Data Reviewed: Basic Metabolic Panel:  Recent Labs Lab 12/19/15 2143 12/22/15 1328 12/22/15 1956 12/23/15 0434  NA 135 130* 126* 135  K 4.2 4.1 3.5 3.9  CL 104  --  101 104  CO2 24 23 18* 21*  GLUCOSE 104* 91 123* 86  BUN 24* 16.0 17 14  CREATININE 1.19 1.2 1.05 0.95  CALCIUM 8.9 8.8 7.6* 8.0*  MG  --   --   --  2.2  PHOS  --   --   --  2.6   Liver Function Tests:  Recent Labs Lab 12/19/15 2143 12/22/15 1328 12/22/15 1956 12/23/15 0434  AST _0 ALT _1 ALKPHOS 66 77 63 60  BILITOT 0.3 0.36 0.6 0.6  PROT 5.8* 6.6 6.0* 5.9*  ALBUMIN 3.3* 3.2* 3.1* 3.1*   No results for input(s): LIPASE, AMYLASE in the last 168 hours. No results for input(s): AMMONIA in the last 168 hours. CBC:  Recent Labs Lab 12/19/15 2143 12/22/15 1326 12/22/15 1956 12/23/15 0434 12/24/15 0420  WBC  3.2* 2.4* 2.0* 1.9* 2.1*  NEUTROABS 1.7 1.7 1.5* 1.1* 1.6*  HGB 10.2* 10.0* 9.1* 9.1* 9.5*  HCT 29.4* 28.8* 26.3* 26.0* 26.9*  MCV 95.5 94.7 94.6 94.2 92.8  PLT 50* 39* 38* 34* 41*   Cardiac Enzymes:  Recent Labs Lab 12/23/15 0029 12/23/15 0434 12/23/15 1054  TROPONINI 0.03 0.03 0.04*   BNP (last 3 results) No results for input(s): PROBNP in the last 8760 hours. CBG: No results for input(s): GLUCAP in the last 168 hours.  Recent Results (from the past 240 hour(s))  Urine Culture     Status: None   Collection Time: 12/22/15  1:28 PM  Result Value Ref Range Status   Urine Culture, Routine Final report  Final   Urine Culture result 1 No growth  Final  Culture, Blood     Status: None (Preliminary result)   Collection Time: 12/22/15  1:29 PM  Result Value Ref Range Status   BLOOD CULTURE, ROUTINE Preliminary report  Preliminary   RESULT 1 Comment  Preliminary    Comment: No growth in 36 - 48 hours.  Respiratory virus panel     Status: Abnormal   Collection Time: 12/23/15 11:44  AM  Result Value Ref Range Status   Respiratory Syncytial Virus A Negative Negative Final   Respiratory Syncytial Virus Colan Negative Negative Final   Influenza A Negative Negative Final   Influenza Nnamdi Negative Negative Final   Parainfluenza 1 Negative Negative Final   Parainfluenza 2 Negative Negative Final   Parainfluenza 3 Negative Negative Final   Metapneumovirus Positive (A) Negative Final   Rhinovirus Negative Negative Final   Adenovirus Negative Negative Final    Comment: (NOTE) Performed At: [facility] [place], [place] [id number] Lindon Romp MD [id number]   MRSA PCR Screening     Status: None   Collection Time: 12/24/15 12:59 PM  Result Value Ref Range Status   MRSA by PCR NEGATIVE NEGATIVE Final    Comment:        The GeneXpert MRSA Assay (FDA approved for NASAL specimens only), is one component of a comprehensive MRSA colonization surveillance  program. It is not intended to diagnose MRSA infection nor to guide or monitor treatment for MRSA infections.          Studies: No results found.      Scheduled Meds: . aspirin  325 mg Oral Daily  . escitalopram  10 mg Oral Daily  . guaiFENesin  600 mg Oral BID  . ipratropium-albuterol  3 mL Nebulization TID  . levothyroxine  150 mcg Oral QAC breakfast  . loratadine  10 mg Oral Daily  . pantoprazole  40 mg Oral BID AC  . piperacillin-tazobactam (ZOSYN)  IV  3.375 g Intravenous Q8H  . predniSONE  50 mg Oral Q breakfast  . senna  1 tablet Oral BID  . sodium chloride flush  3 mL Intravenous Q12H  . valACYclovir  1,000 mg Oral Daily   Continuous Infusions:   Active Problems:   Multiple myeloma (HCC)   Hypothyroidism   Thrombocytopenia (HCC)   Asthma exacerbation   Hyponatremia   HCAP (healthcare-associated pneumonia)   Esophageal reflux    Time spent: 25 minutes.    Vernell Leep, MD, FACP, FHM. [facility] Pager [phone]  If 7PM-7AM, please contact night-coverage [email or web address] Password [email or web address] 12/25/2015, 1:31 PM    LOS: 3 days

## 2015-12-26 MED ORDER — PANTOPRAZOLE SODIUM 40 MG PO TBEC: 40.0000 mg | DELAYED_RELEASE_TABLET | Freq: Two times a day (BID) | ORAL | Status: DC

## 2015-12-26 MED ORDER — ALBUTEROL SULFATE HFA 108 (90 BASE) MCG/ACT IN AERS: 2.0000 | INHALATION_SPRAY | RESPIRATORY_TRACT | Status: DC | PRN

## 2015-12-26 MED ORDER — PREDNISONE 10 MG PO TABS: ORAL_TABLET | ORAL | Status: DC

## 2015-12-26 NOTE — Discharge Summary (Signed)
Physician Discharge Summary  Andre [name]  NAT:[id number]  DOB: May 24, 1951  DOA: 12/22/2015  PCP: [name], MD  Outpatient Specialists: Oncologist: Dr. [name] Magrinat Neurologist: Dr. Vickey Sages Hartsell III  Admit date: 12/22/2015 Discharge date: 12/26/2015  Time spent: Greater than 30 minutes  Recommendations for Outpatient Follow-up:  1. Dr. [name], PCP in 1 week. Please follow final blood culture results that were sent from the hospital. 2. Dr. Lurline Del, Oncology: Patient is advised to keep prior appointment on 12/28/15 at 9:30 AM. To be seen with repeat labs (CBC with differential & BMP). 3. Patient may need repeat chest imaging to ensure resolution of pneumonia findings. Pneumonia did not show up on chest x-ray and was seen on CT chest. May consider repeating CT chest without contrast in 3-4 weeks. 4. Patient states that he has follow-up appointment with his transplant team at the [facility] on 12/29/15.  Discharge Diagnoses:  Active Problems:   Multiple myeloma (HCC)   Hypothyroidism   Thrombocytopenia (HCC)   Asthma exacerbation   Hyponatremia   HCAP (healthcare-associated pneumonia)   Esophageal reflux   Discharge Condition: Improved & Stable  Diet recommendation: Heart healthy diet.  Filed Weights   12/23/15 1500  Weight: 84.006 kg (185 lb 3.2 oz)    History of present illness:  65 year old male with history of multiple myeloma diagnosed Feb 2012, on chemotherapy, awaiting bone marrow transplantation, CNS symptoms and abnormal brain MRI extensively evaluated by neurology without definitive diagnosis and improved, TGA, former smoker, HTN, HLD, GERD, duodenal ulcer, hypothyroidism, depression, anemia, asthma presented to [facility] on 12/22/15 with complaints of cough and wheezing. Seen in ED on 4/2 for similar symptoms and treated with levofloxacin and albuterol, follow-up in oncology office 4/3 included negative chest x-ray and flu screen, noted to have  progressively worsening symptoms, seen again in oncology office 4/5 and referred for admission. In the ED, workup significant for sodium 126, bicarbonate 18, normal lactate, PCT <0.1, hemoglobin 9.1, platelets 31 and repeat chest x-ray without acute findings. Admitted for URTI and asthma exacerbation. Oncologist consulted and requested CT chest which revealed findings suspicious for pneumonia. Pulmonology consulted due to severity of symptoms.  Hospital Course:   Asthma/COPD exacerbation - Likely precipitated by viral URI/pneumonitis complicated by pneumonia which was not seen on initial chest x-ray 4/5 but revealed on CT chest 4/6. - Treated with IV antibiotics (vancomycin & Zosyn), changed steroids to IV Solu-Medrol, scheduled and when necessary bronchodilator nebulizations, aggressive pulmonary toilet and twice a day PPI for possible GERD as cause of flareup. - Pulmonology consultation appreciated: Suspecting viral URI/pneumonitis complicated by HCAP-RSV panel positive for Metapneumovirus - Discontinued vancomycin (MRSA PCR negative). Changed steroids to oral. - Clinically improved. Breathing back to baseline. Not hypoxic with ambulation/activity. Patient will complete rapid prednisone taper (currently off of Decadron that he takes for his myeloma) and complete course of levofloxacin on 4/12 (had been initiated prior to admission).  Healthcare associated pneumonia - Not seen on chest x-ray 4/5 but noted on CT chest with contrast on 4/6. - Treated with empiric IV vancomycin and Zosyn pending culture results.  - MRSA PCR negative and hence vancomycin discontinued  - As per pulmonology, not suspecting PCP. Sputum for PCP could not be sent. RSV panel positive for Metapneumovirus  Acute respiratory failure with hypoxia - Secondary to pneumonia and asthma exacerbation. - Treatment as above and oxygen supplementation. - Minimally elevated troponin likely secondary to demand ischemia from same. -  Resolved.  Hyponatremia - Possibly  from dehydration. Resolved.  Pancytopenia  - Secondary to multiple myeloma and related treatment. ANC: 1.1 - Stable. Outpatient follow-up with oncology on 4/11.  Multiple myeloma - Oncologist input appreciated. Patient states that he has a follow-up appointment at the [facility] transplant Department on 4/12. On valacyclovir per oncology.  Hypothyroidism - Continue Synthroid.  Acute encephalopathy - Likely related to acute illness. As per discussion with oncology, patient has been evaluated extensively for prior such episodes by neurology without definitive diagnosis.  - Resolved.  GERD - Twice a day PPI.   Consultants:  Medical oncology  Pulmonology  Procedures:  None   Discharge Exam:  Complaints:  Feels much better. States that his breathing is back at baseline. Intermittent mild dry cough. No other complaints reported. As per RN, no acute issues. Ambulated halls without hypoxia.  Filed Vitals:   12/25/15 2101 12/26/15 0601 12/26/15 0833 12/26/15 1350  BP:  137/67  131/65  Pulse:  55  61  Temp:  97.9 F (36.6 C)  97.7 F (36.5 C)  TempSrc:  Oral  Axillary  Resp:  20  18  Height:      Weight:      SpO2: 93% 97% 97% 96%    General exam: Patient sitting comfortably in chair this morning.  Respiratory system: Clear to auscultation. No increased work of breathing. Cardiovascular system: S1 & S2 heard, RRR. No JVD, murmurs, gallops, clicks or pedal edema. Telemetry: Sinus rhythm. Gastrointestinal system: Abdomen is nondistended, soft and nontender. Normal bowel sounds heard. Central nervous system: Alert and oriented to self and place. No focal neurological deficits. Extremities: Symmetric 5 x 5 power.   Discharge Instructions      Discharge Instructions    Call MD for:  difficulty breathing, headache or visual disturbances    Complete by:  As directed      Call MD for:  extreme fatigue    Complete by:  As directed       Call MD for:  persistant dizziness or light-headedness    Complete by:  As directed      Call MD for:  persistant nausea and vomiting    Complete by:  As directed      Call MD for:  severe uncontrolled pain    Complete by:  As directed      Call MD for:  temperature >100.4    Complete by:  As directed      Diet - low sodium heart healthy    Complete by:  As directed      Increase activity slowly    Complete by:  As directed             Medication List    TAKE these medications        albuterol 108 (90 Base) MCG/ACT inhaler  Commonly known as:  PROVENTIL HFA;VENTOLIN HFA  Inhale 2 puffs into the lungs every 4 (four) hours as needed for wheezing or shortness of breath.     aspirin 325 MG tablet  Take 325 mg by mouth daily.     cetirizine 10 MG tablet  Commonly known as:  ZYRTEC  Take 10 mg by mouth daily.     cholecalciferol 1000 units tablet  Commonly known as:  VITAMIN D  Take 1 tablet (1,000 Units total) by mouth daily.     dexamethasone 6 MG tablet  Commonly known as:  DECADRON  Take 3 tablets by mouth with food as directed     DULCOLAX PO  Take 1 tablet by mouth daily as needed (for constipation). Reported on 12/07/2015     escitalopram 10 MG tablet  Commonly known as:  LEXAPRO  Take 1 tablet (10 mg total) by mouth daily.     ketoconazole 2 % cream  Commonly known as:  NIZORAL  Apply 1 application topically daily.     levofloxacin 500 MG tablet  Commonly known as:  LEVAQUIN  Take 500 mg by mouth daily. 10 Day Course. ABT Start Date 12/20/15 & End Date 12/29/15.     levothyroxine 150 MCG tablet  Commonly known as:  SYNTHROID, LEVOTHROID  Take 1 tablet by mouth  daily     metoCLOPramide 5 MG tablet  Commonly known as:  REGLAN  Take 1 tablet (5 mg total) by mouth every 6 (six) hours as needed for nausea.     pantoprazole 40 MG tablet  Commonly known as:  PROTONIX  Take 1 tablet (40 mg total) by mouth 2 (two) times daily before a meal.     polyethylene  glycol packet  Commonly known as:  MIRALAX / GLYCOLAX  Take 17 g by mouth daily as needed for mild constipation.     pomalidomide 4 MG capsule  Commonly known as:  POMALYST  Take 1 capsule by mouth with water daily on days 1-14. Repeat every 21 days.     predniSONE 10 MG tablet  Commonly known as:  DELTASONE  Take 4 tabs daily for 2 days, then 3 tabs daily for 2 days, then 2 tabs daily for 2 days, then 1 tab daily for 2 days, then stop.  Start taking on:  12/27/2015     senna 8.6 MG tablet  Commonly known as:  SENOKOT  Take 1 tablet by mouth 2 (two) times daily. Reported on 12/07/2015     valACYclovir 1000 MG tablet  Commonly known as:  VALTREX  Take 1 tablet (1,000 mg total) by mouth daily.     VELCADE IJ  Inject 1 Dose as directed as directed. Inject on Tues and Fri on Days 1-14. Repeat Every 21 Days.     zolpidem 5 MG tablet  Commonly known as:  AMBIEN  Take 1-2 tablets (5-10 mg total) by mouth at bedtime as needed for sleep.       Follow-up Information    Follow up with [name], MD. Schedule an appointment as soon as possible for a visit in 1 week.   Specialty:  Internal Medicine   Contact information:   [place] [address] [phone]       Follow up with Chauncey Cruel, MD On 12/28/2015.   Specialty:  Oncology   Why:  Keep prior appointment. To be seen with repeat labs (CBC with diff & BMP).   Contact information:   [place] [address] [phone]       Get Medicines reviewed and adjusted: Please take all your medications with you for your next visit with your Primary MD  Please request your Primary MD to go over all hospital tests and procedure/radiological results at the follow up. Please ask your Primary MD to get all Hospital records sent to his/her office.  If you experience worsening of your admission symptoms, develop shortness of breath, life threatening emergency, suicidal or homicidal thoughts you  must seek medical attention immediately by calling 911 or calling your MD immediately if symptoms less severe.  You must read complete instructions/literature along with all the possible adverse reactions/side  effects for all the Medicines you take and that have been prescribed to you. Take any new Medicines after you have completely understood and accept all the possible adverse reactions/side effects.   Do not drive when taking pain medications.   Do not take more than prescribed Pain, Sleep and Anxiety Medications  Special Instructions: If you have smoked or chewed Tobacco in the last 2 yrs please stop smoking, stop any regular Alcohol and or any Recreational drug use.  Wear Seat belts while driving.  Please note  You were cared for by a hospitalist during your hospital stay. Once you are discharged, your primary care physician will handle any further medical issues. Please note that NO REFILLS for any discharge medications will be authorized once you are discharged, as it is imperative that you return to your primary care physician (or establish a relationship with a primary care physician if you do not have one) for your aftercare needs so that they can reassess your need for medications and monitor your lab values.    The results of significant diagnostics from this hospitalization (including imaging, microbiology, ancillary and laboratory) are listed below for reference.    Significant Diagnostic Studies: Dg Chest 2 View  12/22/2015  CLINICAL DATA:  Worsening fatigue and confusion.  Neutropenia. EXAM: CHEST  2 VIEW COMPARISON:  12/19/2015 FINDINGS: The heart size and mediastinal contours are within normal limits. Both lungs are clear. The visualized skeletal structures are unremarkable. IMPRESSION: No active cardiopulmonary disease. Electronically Signed   By: Andreas Newport M.D.   On: 12/22/2015 18:26   Dg Chest 2 View  12/19/2015  CLINICAL DATA:  Shortness of breath and cold  symptoms, initial encounter EXAM: CHEST  2 VIEW COMPARISON:  07/08/2015 FINDINGS: Cardiac shadow is within normal limits. The lungs are clear bilaterally. Bony structures are within normal limits. IMPRESSION: No active cardiopulmonary disease. Electronically Signed   By: Inez Catalina M.D.   On: 12/19/2015 21:11   Ct Chest W Contrast  12/23/2015  CLINICAL DATA:  Worsening nonproductive cough, wheezing, and shortness of breath. Fever. Multiple myeloma undergoing chemotherapy. Former smoker. EXAM: CT CHEST WITH CONTRAST TECHNIQUE: Multidetector CT imaging of the chest was performed during intravenous contrast administration. CONTRAST:  18m ISOVUE-300 IOPAMIDOL (ISOVUE-300) INJECTION 61% COMPARISON:  PET-CT on 04/01/2013 FINDINGS: Mediastinum/Lymph Nodes: Mildly enlarged right hilar lymph node is seen measuring 13 mm in short axis on image 40/series 2. No other pathologically enlarged lymph nodes identified. Heart size is normal. Coronary artery calcification noted. Lungs/Pleura: New patchy areas of airspace disease are seen in both lower lobes as well as other small poorly defined nodular densities in the lower lobes and the left upper lobe. These findings are suspicious for infectious or inflammatory process. No evidence of pleural effusion. Upper abdomen: Tiny calcified gallstone seen, without evidence acute cholecystitis. Musculoskeletal: No focal lytic bone lesions identified. Mild T12 vertebral body compression fracture is seen which is new since 2014. IMPRESSION: New patchy areas of airspace disease and ill-defined nodular densities in both lungs, suspicious for infectious or inflammatory process. Consider short-term followup by CT in 3 months. New mild right hilar lymphadenopathy, which may be reactive in etiology. Recommend continued attention on follow-up CT. Mild T12 vertebral body compression fracture which is new since 2014 exam. Electronically Signed   By: JEarle GellM.D.   On: 12/23/2015 11:11     Microbiology: Recent Results (from the past 240 hour(s))  Urine Culture     Status: None   Collection Time:  12/22/15  1:28 PM  Result Value Ref Range Status   Urine Culture, Routine Final report  Final   Urine Culture result 1 No growth  Final  Culture, Blood     Status: None (Preliminary result)   Collection Time: 12/22/15  1:29 PM  Result Value Ref Range Status   BLOOD CULTURE, ROUTINE Preliminary report  Preliminary   RESULT 1 Comment  Preliminary    Comment: No growth in 36 - 48 hours.  Respiratory virus panel     Status: Abnormal   Collection Time: 12/23/15 11:44 AM  Result Value Ref Range Status   Respiratory Syncytial Virus A Negative Negative Final   Respiratory Syncytial Virus Piero Negative Negative Final   Influenza A Negative Negative Final   Influenza Rino Negative Negative Final   Parainfluenza 1 Negative Negative Final   Parainfluenza 2 Negative Negative Final   Parainfluenza 3 Negative Negative Final   Metapneumovirus Positive (A) Negative Final   Rhinovirus Negative Negative Final   Adenovirus Negative Negative Final    Comment: (NOTE) Performed At: [facility] [place], [place] [id number] Lindon Romp MD [id number]   MRSA PCR Screening     Status: None   Collection Time: 12/24/15 12:59 PM  Result Value Ref Range Status   MRSA by PCR NEGATIVE NEGATIVE Final    Comment:        The GeneXpert MRSA Assay (FDA approved for NASAL specimens only), is one component of a comprehensive MRSA colonization surveillance program. It is not intended to diagnose MRSA infection nor to guide or monitor treatment for MRSA infections.      Labs: Basic Metabolic Panel:  Recent Labs Lab 12/19/15 2143 12/22/15 1328 12/22/15 1956 12/23/15 0434  NA 135 130* 126* 135  K 4.2 4.1 3.5 3.9  CL 104  --  101 104  CO2 24 23 18* 21*  GLUCOSE 104* 91 123* 86  BUN 24* 16.0 17 14  CREATININE 1.19 1.2 1.05 0.95  CALCIUM 8.9 8.8 7.6* 8.0*  MG  --    --   --  2.2  PHOS  --   --   --  2.6   Liver Function Tests:  Recent Labs Lab 12/19/15 2143 12/22/15 1328 12/22/15 1956 12/23/15 0434  AST _0 ALT _1 ALKPHOS 66 77 63 60  BILITOT 0.3 0.36 0.6 0.6  PROT 5.8* 6.6 6.0* 5.9*  ALBUMIN 3.3* 3.2* 3.1* 3.1*   No results for input(s): LIPASE, AMYLASE in the last 168 hours. No results for input(s): AMMONIA in the last 168 hours. CBC:  Recent Labs Lab 12/19/15 2143 12/22/15 1326 12/22/15 1956 12/23/15 0434 12/24/15 0420  WBC 3.2* 2.4* 2.0* 1.9* 2.1*  NEUTROABS 1.7 1.7 1.5* 1.1* 1.6*  HGB 10.2* 10.0* 9.1* 9.1* 9.5*  HCT 29.4* 28.8* 26.3* 26.0* 26.9*  MCV 95.5 94.7 94.6 94.2 92.8  PLT 50* 39* 38* 34* 41*   Cardiac Enzymes:  Recent Labs Lab 12/23/15 0029 12/23/15 0434 12/23/15 1054  TROPONINI 0.03 0.03 0.04*   BNP: BNP (last 3 results)  Recent Labs  12/19/15 2143  BNP 103.5*    ProBNP (last 3 results) No results for input(s): PROBNP in the last 8760 hours.  CBG: No results for input(s): GLUCAP in the last 168 hours.     Signed:  Vernell Leep, MD, FACP, FHM. [facility] Pager [phone]  If 7PM-7AM, please contact night-coverage [email or web address] Password [email or web address] 12/26/2015, 2:17 PM

## 2015-12-26 NOTE — Discharge Instructions (Signed)

## 2015-12-26 NOTE — Progress Notes (Signed)
Patient ambulated approximately 360 ft on room air, oxygen saturation level remained 96%-98 %. Patient tolerated well and denies any SOB.

## 2015-12-26 NOTE — Progress Notes (Signed)
Discharge instructions reviewed with patient and wife utilizing teach back method. No questions at this time. Patient being  discharged to home.

## 2015-12-27 ENCOUNTER — Telehealth: Payer: Self-pay | Admitting: *Deleted

## 2015-12-27 LAB — KAPPA/LAMBDA LIGHT CHAINS
KAPPA FREE LGHT CHN: 27.61 mg/L — AB (ref 3.30–19.40)
KAPPA, LAMDA LIGHT CHAIN RATIO: 1.11 (ref 0.26–1.65)
Lambda free light chains: 24.82 mg/L (ref 5.71–26.30)

## 2015-12-27 NOTE — Telephone Encounter (Signed)
Called pt to set up TCM appt pt states he is already schedule to see md 4/20, and want to keep appt on that day. MD doesn't have any sooner appt available...Andre [name]

## 2015-12-28 ENCOUNTER — Ambulatory Visit: Payer: Commercial Managed Care - PPO

## 2015-12-28 ENCOUNTER — Ambulatory Visit (HOSPITAL_BASED_OUTPATIENT_CLINIC_OR_DEPARTMENT_OTHER): Payer: Commercial Managed Care - PPO | Admitting: Oncology

## 2015-12-28 ENCOUNTER — Other Ambulatory Visit (HOSPITAL_BASED_OUTPATIENT_CLINIC_OR_DEPARTMENT_OTHER): Payer: Commercial Managed Care - PPO

## 2015-12-28 VITALS — BP 131/77 | HR 67 | Temp 98.1°F | Resp 18 | Ht 70.0 in | Wt 185.0 lb

## 2015-12-28 DIAGNOSIS — C9000 Multiple myeloma not having achieved remission: Secondary | ICD-10-CM | POA: Diagnosis not present

## 2015-12-28 DIAGNOSIS — C9002 Multiple myeloma in relapse: Secondary | ICD-10-CM

## 2015-12-28 LAB — MULTIPLE MYELOMA PANEL, SERUM
ALBUMIN/GLOB SERPL: 1.1 (ref 0.7–1.7)
Albumin SerPl Elph-Mcnc: 2.9 g/dL (ref 2.9–4.4)
Alpha 1: 0.4 g/dL (ref 0.0–0.4)
Alpha2 Glob SerPl Elph-Mcnc: 0.8 g/dL (ref 0.4–1.0)
B-GLOBULIN SERPL ELPH-MCNC: 1 g/dL (ref 0.7–1.3)
GAMMA GLOB SERPL ELPH-MCNC: 0.5 g/dL (ref 0.4–1.8)
GLOBULIN, TOTAL: 2.7 g/dL (ref 2.2–3.9)
IGA: 105 mg/dL (ref 61–437)
IgG (Immunoglobin G), Serum: 626 mg/dL — ABNORMAL LOW (ref 700–1600)
IgM, Serum: 32 mg/dL (ref 20–172)
M PROTEIN SERPL ELPH-MCNC: 0.2 g/dL — AB
Total Protein ELP: 5.6 g/dL — ABNORMAL LOW (ref 6.0–8.5)

## 2015-12-28 LAB — CBC WITH DIFFERENTIAL/PLATELET
BASO%: 0.1 % (ref 0.0–2.0)
Basophils Absolute: 0 10*3/uL (ref 0.0–0.1)
EOS%: 0.1 % (ref 0.0–7.0)
Eosinophils Absolute: 0 10*3/uL (ref 0.0–0.5)
HEMATOCRIT: 32.1 % — AB (ref 38.4–49.9)
HEMOGLOBIN: 10.5 g/dL — AB (ref 13.0–17.1)
LYMPH#: 0.4 10*3/uL — AB (ref 0.9–3.3)
LYMPH%: 14 % (ref 14.0–49.0)
MCH: 32.1 pg (ref 27.2–33.4)
MCHC: 32.8 g/dL (ref 32.0–36.0)
MCV: 97.9 fL (ref 79.3–98.0)
MONO#: 0.3 10*3/uL (ref 0.1–0.9)
MONO%: 11 % (ref 0.0–14.0)
NEUT%: 74.8 % (ref 39.0–75.0)
NEUTROS ABS: 2.4 10*3/uL (ref 1.5–6.5)
PLATELETS: 139 10*3/uL — AB (ref 140–400)
RBC: 3.28 10*6/uL — AB (ref 4.20–5.82)
RDW: 19 % — AB (ref 11.0–14.6)
WBC: 3.2 10*3/uL — AB (ref 4.0–10.3)

## 2015-12-28 LAB — CULTURE, BLOOD (SINGLE)

## 2015-12-28 LAB — COMPREHENSIVE METABOLIC PANEL
ALBUMIN: 3.2 g/dL — AB (ref 3.5–5.0)
ALK PHOS: 55 U/L (ref 40–150)
ALT: 34 U/L (ref 0–55)
ANION GAP: 9 meq/L (ref 3–11)
AST: 20 U/L (ref 5–34)
BUN: 21.9 mg/dL (ref 7.0–26.0)
CO2: 25 mEq/L (ref 22–29)
CREATININE: 1.1 mg/dL (ref 0.7–1.3)
Calcium: 9.6 mg/dL (ref 8.4–10.4)
Chloride: 106 mEq/L (ref 98–109)
EGFR: 73 mL/min/{1.73_m2} — AB (ref >90)
GLUCOSE: 88 mg/dL (ref 70–140)
POTASSIUM: 4 meq/L (ref 3.5–5.1)
SODIUM: 139 meq/L (ref 136–145)
Total Bilirubin: 0.55 mg/dL (ref 0.20–1.20)
Total Protein: 6.3 g/dL — ABNORMAL LOW (ref 6.4–8.3)

## 2015-12-28 MED ORDER — SULFAMETHOXAZOLE-TRIMETHOPRIM 800-160 MG PO TABS: 1.0000 | ORAL_TABLET | Freq: Two times a day (BID) | ORAL | Status: DC

## 2015-12-28 NOTE — Progress Notes (Signed)
ID:  [name]   DOB: 12/23/1950  MR#: [id number]  CSN#:[id number]   PCP: Andre [name] SU: OTHER MD: Andre [name], Andre [name], Andre [name],Andre [name], Andre [name] ([phone]), Andre [name]  CHIEF COMPLAINT: Multiple myeloma  CURRENT TREATMENT: Bortezomib, pomalidomide, dexamethasone  HISTORY OF PRESENT ILLNESS: From the original intake nodes:  The patient was worked up for peptic ulcer disease in August of 2011, with significant bleeding and anemia.  The patient was Helicobacter pylori negative.  He received some epinephrine when he had his EGD and then started on Protonix.  [name] anemia slowly resolved so that by September, his hemoglobin was up to 10.9 and by earlier this month, his hemoglobin was up to 12.5.    As part of his general workup, he was found to have a slightly elevated globulin fraction.  In September, his total protein was 8.3 with an albumin of 3.8.  In January, the total protein was 8.4 and albumin 3.6.  With persistence of this slight abnormality, Andre [name] obtained serum immunofixation and SPEP.  The SPTP showed an M-spike of 2.67 grams.  A total IgG was 4,190.  Total IgA low at 28.  Total IgM low at 28 also.  The immunofixation showed a monoclonal IgG lambda paraprotein.  There were also monoclonal free lambda light chains present. With this information, the patient was referred for further evaluation.   A diagnosis of myeloma was confirmed by bone marrow biopsy and subsequebnt treatment is as detailed below  INTERVAL HISTORY: Andre [name] returns today for follow-up of his multiple myeloma, accompanied by his wife, Andre [name]. He was being treated with bortezomib, dexamethasone and pomalidomide, but those treatments were stopped at the end of March when he developed what proved to be a viral pneumonia. He was admitted, treated with broad-spectrum antibiotics while cultures were pending, and finally improved when started on steroids. I had requested a  sputum for PCP but that was never sent.  While in the hospital we obtained a final SPEP and kappa/ lambda chains. The SPEP results are pending but the light chains show a normal ratio.  REVIEW OF SYSTEMS: Andre [name] has largely recovered from his pneumonia. He is "up and about,", but not yet back to work. He is not planning to go back to work until after he recovers from the planned transplantation. He has problems with insomnia, some seasonal allergies, ringing in his ears, still has a little bit of a dry cough, and is being treated for heartburn, which is contributing to the cough. He has no pain, fever, rash, or other systemic symptoms. A detailed review of systems today was otherwise stable.  PAST MEDICAL HISTORY: Past Medical History  Diagnosis Date  . Hypertension   . Hyperlipidemia   . Duodenal ulcer   . Allergy   . Thyroid disease   . GERD (gastroesophageal reflux disease)   . Depression   . Anemia   . Hypothyroidism   . Asthma     no treatment x 20 years  . Arthritis   . Double vision     occurs at times   . Multiple myeloma 07/04/2011  Significant for peptic ulcer disease as noted above.  History of hyperlipidemia.  History of anxiety and depression.  History of GERD which is significantly improved with weight loss and history of reactive airway disease, possibly secondary to the GERD which also improved with weight loss.  There was a history of obesity, now much improved secondary to exercise and diet.   PAST   SURGICAL HISTORY: Past Surgical History  Procedure Laterality Date  . Cardiolite study  11/25/2003    NORMAL  . Bone marrow transplant  2011    for MM  . Tonsillectomy      FAMILY HISTORY Family History  Problem Relation Age of Onset  . Throat cancer Mother   . Hypertension Father   . Stroke Father   . Asthma Father   . Diabetes Father   . Pancreatic cancer Brother   [name] father died at the age of 84 following a stroke.  [name] mother died with  cancer of the throat at the age of 74.   She had a history of depression and was a smoker.  Not clear how much alcohol she drank, according to the patient.  The patient had two brothers.  One died from the age of 6 from a "kidney problem."  The second one died at the age of 56 from pancreatic cancer. (This may have been duodenal cancer. 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  His wife of 37+ years was Andre [name], but they are now divorced. In 2016 he remarried, his current wife's name is Andre [name].  The patient has two daughters:  Andre [name], who works in real estate, and Andre [name], who is expecting her second child April 2016. .  They both live here in [place]. 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  The patient is very close to his daughters.  Andre [name]'s first daughter, Andre [name], born 03/06/2013, apparently has Weber/Osler/Rendu. The patient is not a church attender.  He does derive a great deal of support from friends at the [employer] which he attends very regularly, and also participates in Al-Anon even though actually there is no alcohol or drug problem in the family.  He participates in this group and he gets quite a bit of support from it.     ADVANCED DIRECTIVES: In place  HEALTH MAINTENANCE: (updated 07/16/2013) Social History  Substance Use Topics  . Smoking status: Former Smoker -- 3 years    Quit date: 03/27/1969  . Smokeless tobacco: Never Used  . Alcohol Use: No     Colonoscopy:  PSA:  Sept 2014, normal/Dr. [name]  Lipid panel: Sept 2014/Dr. [name]   Allergies  Allergen Reactions  . Atorvastatin Other (See Comments)  . Rosuvastatin     Other reaction(s): Liver Disorder  . Crestor [Rosuvastatin Calcium]     ADVERSE EFFECTS ON LIVER  . Lipitor [Atorvastatin Calcium]      Current Outpatient Prescriptions  Medication Sig Dispense Refill  . albuterol (PROVENTIL HFA;VENTOLIN HFA) 108 (90 Base) MCG/ACT inhaler Inhale 2 puffs into the lungs every 4 (four) hours as needed for wheezing or shortness of breath. 1 Inhaler 0  . aspirin 325 MG tablet Take 325 mg by mouth daily.    . Bisacodyl (DULCOLAX PO) Take 1 tablet by mouth daily as needed (for constipation). Reported on 12/07/2015    . Bortezomib (VELCADE IJ) Inject 1 Dose as directed as directed. Inject on Tues and Fri on Days 1-14. Repeat Every 21 Days.    . cetirizine (ZYRTEC) 10 MG tablet Take 10 mg by mouth daily.    . cholecalciferol (VITAMIN D) 1000 UNITS tablet Take 1 tablet (1,000 Units total) by mouth daily. 100 tablet 3  .   dexamethasone (DECADRON) 6 MG tablet Take 3 tablets by mouth with food as directed (Patient taking differently: Take 18 mg by mouth as directed. Takes 3 tablets (18 mg) on Tues and Fri on Days 1-14. Repeat Every 21 Days.) 24 tablet 4  . escitalopram (LEXAPRO) 10 MG tablet Take 1 tablet (10 mg total) by mouth daily. 30 tablet 5  . ketoconazole (NIZORAL) 2 % cream Apply 1 application topically daily. (Patient taking differently: Apply 1 application topically daily as needed for irritation. ) 15 g 0  . levofloxacin (LEVAQUIN) 500 MG tablet Take 500 mg by mouth daily. 10 Day Course. ABT Start Date 12/20/15 & End Date 12/29/15.    . levothyroxine (SYNTHROID, LEVOTHROID) 150 MCG tablet Take 1 tablet by mouth  daily 90 tablet 3  . metoCLOPramide (REGLAN) 5 MG tablet Take 1 tablet (5 mg total) by mouth every 6 (six) hours as needed for nausea. 90 tablet 1  . pantoprazole (PROTONIX) 40 MG tablet Take 1 tablet (40 mg total) by mouth 2 (two) times daily before a meal. 60 tablet 0  . polyethylene glycol (MIRALAX / GLYCOLAX) packet Take 17 g by mouth daily as needed for mild constipation.     . pomalidomide (POMALYST) 4 MG capsule Take 1 capsule by mouth with water daily on days 1-14. Repeat every 21  days. 14 capsule 4  . predniSONE (DELTASONE) 10 MG tablet Take 4 tabs daily for 2 days, then 3 tabs daily for 2 days, then 2 tabs daily for 2 days, then 1 tab daily for 2 days, then stop. 20 tablet 0  . senna (SENOKOT) 8.6 MG tablet Take 1 tablet by mouth 2 (two) times daily. Reported on 12/07/2015    . valACYclovir (VALTREX) 1000 MG tablet Take 1 tablet (1,000 mg total) by mouth daily. 60 tablet 6  . zolpidem (AMBIEN) 5 MG tablet Take 1-2 tablets (5-10 mg total) by mouth at bedtime as needed for sleep. 30 tablet 1  . [DISCONTINUED] fexofenadine (ALLEGRA) 60 MG tablet Take 180 mg by mouth daily.       No current facility-administered medications for this visit.    OBJECTIVE: Middle-aged white man who appears stated age  Filed Vitals:   12/28/15 0931  BP: 131/77  Pulse: 67  Temp: 98.1 F (36.7 C)  Resp: 18   Body mass index is 26.54 kg/(m^2).  Filed Weights   12/28/15 0931  Weight: 185 lb (83.915 kg)   Sclerae unicteric, pupils round and equal Oropharynx clear and moist-- no thrush or other lesions No cervical or supraclavicular adenopathy Lungs no rales or rhonchi, no wheezes appreciated Heart regular rate and rhythm Abd soft, nontender, positive bowel sounds MSK no focal spinal tenderness, no upper extremity lymphedema Neuro: nonfocal, well oriented, appropriate affect   LAB RESULTS:  Lab Results  Component Value Date   WBC 3.2* 12/28/2015   NEUTROABS 2.4 12/28/2015   HGB 10.5* 12/28/2015   HCT 32.1* 12/28/2015   MCV 97.9 12/28/2015   PLT 139* 12/28/2015      Chemistry      Component Value Date/Time   NA 135 12/23/2015 0434   NA 130* 12/22/2015 1328   K 3.9 12/23/2015 0434   K 4.1 12/22/2015 1328   CL 104 12/23/2015 0434   CL 107 03/10/2013 1352   CO2 21* 12/23/2015 0434   CO2 23 12/22/2015 1328   BUN 14 12/23/2015 0434   BUN 16.0 12/22/2015 1328   CREATININE 0.95 12/23/2015 0434   CREATININE 1.2 12/22/2015   1328      Component Value Date/Time   CALCIUM  8.0* 12/23/2015 0434   CALCIUM 8.8 12/22/2015 1328   ALKPHOS 60 12/23/2015 0434   ALKPHOS 77 12/22/2015 1328   AST 24 12/23/2015 0434   AST 22 12/22/2015 1328   ALT 23 12/23/2015 0434   ALT 24 12/22/2015 1328   BILITOT 0.6 12/23/2015 0434   BILITOT 0.36 12/22/2015 1328     Results for Snowden,  J (MRN [id number]) as of 12/28/2015 09:56  Ref. Range 05/11/2015 08:50 10/19/2015 09:40 11/16/2015 08:00 12/07/2015 11:11 12/24/2015 04:20  M-SPIKE, % Latest Ref Range: Not Observed g/dL  1.3 (H) 0.6 (H) 0.4 (H)    STUDIES: Dg Chest 2 View  12/22/2015  CLINICAL DATA:  Worsening fatigue and confusion.  Neutropenia. EXAM: CHEST  2 VIEW COMPARISON:  12/19/2015 FINDINGS: The heart size and mediastinal contours are within normal limits. Both lungs are clear. The visualized skeletal structures are unremarkable. IMPRESSION: No active cardiopulmonary disease. Electronically Signed   By: Daniel R Mitchell M.D.   On: 12/22/2015 18:26   Dg Chest 2 View  12/19/2015  CLINICAL DATA:  Shortness of breath and cold symptoms, initial encounter EXAM: CHEST  2 VIEW COMPARISON:  07/08/2015 FINDINGS: Cardiac shadow is within normal limits. The lungs are clear bilaterally. Bony structures are within normal limits. IMPRESSION: No active cardiopulmonary disease. Electronically Signed   By: Mark  Lukens M.D.   On: 12/19/2015 21:11   Ct Chest W Contrast  12/23/2015  CLINICAL DATA:  Worsening nonproductive cough, wheezing, and shortness of breath. Fever. Multiple myeloma undergoing chemotherapy. Former smoker. EXAM: CT CHEST WITH CONTRAST TECHNIQUE: Multidetector CT imaging of the chest was performed during intravenous contrast administration. CONTRAST:  75mL ISOVUE-300 IOPAMIDOL (ISOVUE-300) INJECTION 61% COMPARISON:  PET-CT on 04/01/2013 FINDINGS: Mediastinum/Lymph Nodes: Mildly enlarged right hilar lymph node is seen measuring 13 mm in short axis on image 40/series 2. No other pathologically enlarged lymph nodes identified. Heart  size is normal. Coronary artery calcification noted. Lungs/Pleura: New patchy areas of airspace disease are seen in both lower lobes as well as other small poorly defined nodular densities in the lower lobes and the left upper lobe. These findings are suspicious for infectious or inflammatory process. No evidence of pleural effusion. Upper abdomen: Tiny calcified gallstone seen, without evidence acute cholecystitis. Musculoskeletal: No focal lytic bone lesions identified. Mild T12 vertebral body compression fracture is seen which is new since 2014. IMPRESSION: New patchy areas of airspace disease and ill-defined nodular densities in both lungs, suspicious for infectious or inflammatory process. Consider short-term followup by CT in 3 months. New mild right hilar lymphadenopathy, which may be reactive in etiology. Recommend continued attention on follow-up CT. Mild T12 vertebral body compression fracture which is new since 2014 exam. Electronically Signed   By: Andre [name]  Stahl M.D.   On: 12/23/2015 11:11   ASSESSMENT: 64 y.o.  [place] man with a history of multiple myeloma diagnosed February of 2012, with an initial M-spike of 2.6 g/dL, IFE showing an IgG lambda paraprotein and free lambda light chains in the urine. Cytogenetics showed trisomy 11. Bone marrow biopsy showed a 22% plasmacytosis. Treated with   (1) bortezomib (subcutaneously), lenalidomide, and dexamethasone, with repeat bone marrow biopsy May of 2012 showing 10% plasmacytosis   (2) high-dose chemotherapy with BCNU and melphalan at [facility], followed by stem cell rescue July of 2012   (3) on zoledronic acid started December of 2012, initially monthly, currently given every 3 months, most recent dose 02/17/2014  (  4) low-dose lenalidomide resumed April 2013, interrupted several times since.  Resumed again on 02/19/2013, and a dose of 5 mg daily, 21 days on and 7 days off, later further reduced to 7 days on, 7 days off  (5) CNS symptoms and abnormal  brain MRI extensively evaluated by neurology with no definitive diagnosis established; improving   (6) rising M spike noted June 2014 but did not meet criteria for carfilzomib study  (7) on lenalidomide 25 mg daily, 14 days on, 7 days off, starting 04/18/2013, interrupted December 2014 because of rash;   (a) resumed January 2015 at 10 mg/ day at 21 days on/ 7 days off  (Dalin) starting 08/18/2014 decreasedto 10 mg/ day 14 days on, 7 days off because of cytopenias  (c) as of February 2016 he has been on 5 mg daily 7 days on 7 days off  (d) lenalidomide discontinued December 2016 with evidence of disease progression  (8) transient global amnesia 05/29/2015, resolved without intervention  (9) starting PVD 10/25/2015 w ASA 325 thromboprophylaxis, valacyclovir prophylaxis, last dose 12/17/2015  (a) pomalidomide 4 mg/d days 1-14  (Tyger) bortezomib sQ days 2,5,9,12 of each 21 day cycle  (c) dexamethasome 20 mg two days a week  (10) metapneumovirus pneumonia April 2017  (a) completing course of steroids and week of bactrim mid April 2017  PLAN:  Andre [name] feels "pretty much normal", and certainly from a pneumonia point of view he has turned the corner. The one test we did not send was a sputum sample for PCP, and I want to make sure we cover that. All antibiotics he had in the hospital did not. This steroids he received of course would have helped if he had that diagnosis.  Just in case I am putting him on Bactrim for a week. We discussed the possible toxicities side effects and complications of this agent and he will let me know if he has any problems from it. He knows to avoid hard sunlight and to drink lots of fluids.  He will be seen at [facility] tomorrow and I expect his transplant will be scheduled for sometime around mid-May. Tentatively he will see me again in a couple of weeks and we will operational eyes any other plans that need to be made before he undergoes treatment at [facility]  He knows to call for any  other problems that may develop before the next visit.   , C, MD 12/28/2015 t  

## 2015-12-29 ENCOUNTER — Telehealth: Payer: Self-pay | Admitting: Oncology

## 2015-12-29 LAB — KAPPA/LAMBDA LIGHT CHAINS
Ig Kappa Free Light Chain: 21.12 mg/L — ABNORMAL HIGH (ref 3.30–19.40)
Ig Lambda Free Light Chain: 21.78 mg/L (ref 5.71–26.30)
KAPPA/LAMBDA FLC RATIO: 0.97 (ref 0.26–1.65)

## 2015-12-29 NOTE — Telephone Encounter (Signed)
sch fu appt per GM 4/11 pof. Pt will get updated sch on 4/14 visit

## 2015-12-30 LAB — PROTEIN ELECTROPHORESIS, SERUM
A/G Ratio: 1.2 (ref 0.7–1.7)
ALPHA 2: 0.7 g/dL (ref 0.4–1.0)
Albumin: 3.1 g/dL (ref 2.9–4.4)
Alpha 1: 0.2 g/dL (ref 0.0–0.4)
BETA: 1 g/dL (ref 0.7–1.3)
GLOBULIN, TOTAL: 2.6 g/dL (ref 2.2–3.9)
Gamma Globulin: 0.7 g/dL (ref 0.4–1.8)
M-SPIKE, %: 0.2 g/dL — AB
Total Protein: 5.7 g/dL — ABNORMAL LOW (ref 6.0–8.5)

## 2015-12-31 ENCOUNTER — Ambulatory Visit: Payer: Commercial Managed Care - PPO

## 2015-12-31 ENCOUNTER — Other Ambulatory Visit: Payer: Commercial Managed Care - PPO

## 2015-12-31 ENCOUNTER — Other Ambulatory Visit: Payer: Self-pay | Admitting: *Deleted

## 2016-01-03 ENCOUNTER — Other Ambulatory Visit: Payer: Self-pay | Admitting: Oncology

## 2016-01-03 MED ORDER — POMALIDOMIDE 2 MG PO CAPS: 4.0000 mg | ORAL_CAPSULE | Freq: Every day | ORAL | Status: DC

## 2016-01-03 NOTE — Progress Notes (Signed)
Faxed bone marrow biopsy results to [facility] transplant coordinator.

## 2016-01-03 NOTE — Progress Notes (Signed)
ID: QUADIR MUNS   DOB: Jul 31, 1951  MR#: [id number]  GYF#:[id number]   PCP: [name] SU: OTHER MD: [name], [name], [address] [place] [phone]), Ardine Bjork  CHIEF COMPLAINT: Multiple myeloma  CURRENT TREATMENT: Bortezomib, pomalidomide, dexamethasone  HISTORY OF PRESENT ILLNESS: From the original intake nodes:  The patient was worked up for peptic ulcer disease in August of 2011, with significant bleeding and anemia.  The patient was Helicobacter pylori negative.  He received some epinephrine when he had his EGD and then started on Protonix.  [name] anemia slowly resolved so that by September, his hemoglobin was up to 10.9 and by earlier this month, his hemoglobin was up to 12.5.    As part of his general workup, he was found to have a slightly elevated globulin fraction.  In September, his total protein was 8.3 with an albumin of 3.8.  In January, the total protein was 8.4 and albumin 3.6.  With persistence of this slight abnormality, Dr. Olevia Perches obtained serum immunofixation and SPEP.  The SPTP showed an M-spike of 2.67 grams.  A total IgG was 4,190.  Total IgA low at 28.  Total IgM low at 28 also.  The immunofixation showed a monoclonal IgG lambda paraprotein.  There were also monoclonal free lambda light chains present. With this information, the patient was referred for further evaluation.   A diagnosis of myeloma was confirmed by bone marrow biopsy and subsequebnt treatment is as detailed below  INTERVAL HISTORY: [name] returns today for follow-up of his myeloma, accompanied by his wife, Louanne Belton.  He is slowly recovering from his apparent viral pneumonia,  Which was treated with various antibiotics including a course of Septra. He also received some doxycycline and unfortunately was exposed to significant sunlight so he still peeling and itchy. We started him on a Medrol Dosepak. He has 3 days to go but the itching is already much  better and almost resolved. He is here to discuss resumption of his treatment  REVIEW OF SYSTEMS: [name] still feels somewhat weak and is not exercising regularly. He goes to bed about 8 and gets up around 4 in the morning. He does have an exercise bike and feels he could start getting back on it. He sometimes has a throbbing discomfort or pain in his feet, but this is very intermittent. His cough which has been dry throughout is now slightly productive of slightly yellow phlegm. There has been no fever no hemoptysis and no purulence.  Bowel movements are finally normal.A detailed review of systems today was otherwise noncontributory  PAST MEDICAL HISTORY: Past Medical History  Diagnosis Date  . Hypertension   . Hyperlipidemia   . Duodenal ulcer   . Allergy   . Thyroid disease   . GERD (gastroesophageal reflux disease)   . Depression   . Anemia   . Hypothyroidism   . Asthma     no treatment x 20 years  . Arthritis   . Double vision     occurs at times   . Multiple myeloma 07/04/2011  Significant for peptic ulcer disease as noted above.  History of hyperlipidemia.  History of anxiety and depression.  History of GERD which is significantly improved with weight loss and history of reactive airway disease, possibly secondary to the GERD which also improved with weight loss.  There was a history of obesity, now much improved secondary to exercise and diet.   PAST SURGICAL HISTORY: Past Surgical History  Procedure Laterality  Date  . Cardiolite study  11/25/2003    NORMAL  . Bone marrow transplant  2011    for MM  . Tonsillectomy      FAMILY HISTORY Family History  Problem Relation Age of Onset  . Throat cancer Mother   . Hypertension Father   . Stroke Father   . Asthma Father   . Diabetes Father   . Pancreatic cancer Brother   [name] father died at the age of 43 following a stroke.  [name] mother died with cancer of the throat at the age of 59.   She had a history of  depression and was a smoker.  Not clear how much alcohol she drank, according to the patient.  The patient had two brothers.  One died from the age of 52 from a "kidney problem."  The second one died at the age of 108 from pancreatic cancer. (This may have been duodenal cancer. 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  His wife of 37+ years was [name], but they are now divorced. In 2016 he remarried, his current wife's name is [name].  The patient has two daughters:  [name], who works in [place], who is expecting her second child April 2016. .  They both live here in [place]. 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  The patient is very close to his daughters.  Bisig first daughter, [name], born 03/06/2013, apparently has Weber/Osler/Rendu. The patient is not a church attender.  He does derive a great deal of support from friends at the "[employer] which he attends very regularly, and also participates in Al-Anon even though actually there is no alcohol or drug problem in the family.  He participates in this group and he gets quite a bit of support from it.     ADVANCED DIRECTIVES: In place  HEALTH MAINTENANCE: (updated 07/16/2013) Social History  Substance Use Topics  . Smoking status: Former Smoker -- 3 years    Quit date: 03/27/1969  . Smokeless tobacco: Never Used  . Alcohol Use: No     Colonoscopy:  PSA:  Sept 2014, normal/Dr. [name]  Lipid panel: Sept 2014/Dr. [name]   Allergies  Allergen Reactions  . Atorvastatin Other (See Comments)  . Rosuvastatin     Other reaction(s): Liver Disorder  . Crestor [Rosuvastatin Calcium]     ADVERSE EFFECTS ON LIVER  . Lipitor [Atorvastatin Calcium]   . Septra [Sulfamethoxazole-Trimethoprim] Rash    Current  Outpatient Prescriptions  Medication Sig Dispense Refill  . albuterol (PROVENTIL HFA;VENTOLIN HFA) 108 (90 Base) MCG/ACT inhaler Inhale 2 puffs into the lungs every 4 (four) hours as needed for wheezing or shortness of breath. 1 Inhaler 3  . aspirin 325 MG tablet Take 325 mg by mouth daily.    . Bisacodyl (DULCOLAX PO) Take 1 tablet by mouth daily as needed (for constipation). Reported on 12/07/2015    . bortezomib IV (VELCADE) 3.5 MG injection Inject 1.3 mg/m2 into the vein once.    . cetirizine (ZYRTEC) 10 MG tablet Take 10 mg by mouth daily.    . cholecalciferol (VITAMIN D) 1000 UNITS tablet Take 1 tablet (1,000 Units total) by mouth daily. 100 tablet 3  . escitalopram (LEXAPRO) 10 MG tablet Take 1 tablet (10 mg total) by  mouth daily. 30 tablet 5  . ketoconazole (NIZORAL) 2 % cream Apply 1 application topically daily. (Patient taking differently: Apply 1 application topically daily as needed for irritation. ) 15 g 0  . levothyroxine (SYNTHROID, LEVOTHROID) 150 MCG tablet Take 1 tablet by mouth  daily 90 tablet 3  . methylPREDNISolone (MEDROL DOSEPAK) 4 MG TBPK tablet Take 6 tabs after breakfast decreasing by one tablet daily until medication is gone. 21 tablet 0  . metoCLOPramide (REGLAN) 5 MG tablet Take 1 tablet (5 mg total) by mouth every 6 (six) hours as needed for nausea. 90 tablet 1  . pantoprazole (PROTONIX) 40 MG tablet Take 1 tablet (40 mg total) by mouth 2 (two) times daily before a meal. 60 tablet 0  . polyethylene glycol (MIRALAX / GLYCOLAX) packet Take 17 g by mouth daily as needed for mild constipation.     . pomalidomide (POMALYST) 2 MG capsule Take 2 capsules (4 mg total) by mouth daily. Take with water on days 1-21. Repeat every 28 days. 42 capsule 6  . predniSONE (DELTASONE) 10 MG tablet Take 4 tabs daily for 2 days, then 3 tabs daily for 2 days, then 2 tabs daily for 2 days, then 1 tab daily for 2 days, then stop. 20 tablet 0  . senna (SENOKOT) 8.6 MG tablet Take 1 tablet by  mouth 2 (two) times daily. Reported on 12/07/2015    . valACYclovir (VALTREX) 1000 MG tablet Take 1 tablet (1,000 mg total) by mouth daily. 60 tablet 6  . zolpidem (AMBIEN) 5 MG tablet Take 1-2 tablets (5-10 mg total) by mouth at bedtime as needed for sleep. 30 tablet 1  . [DISCONTINUED] fexofenadine (ALLEGRA) 60 MG tablet Take 180 mg by mouth daily.       No current facility-administered medications for this visit.    OBJECTIVE: Middle-aged white man  In no acute distress Filed Vitals:   01/07/16 1609  BP: 118/78  Pulse: 82  Temp: 97.6 F (36.4 C)  Resp: 18   Body mass index is 26.36 kg/(m^2).  Filed Weights   01/07/16 1609  Weight: 183 lb 11.2 oz (83.326 kg)   Sclerae unicteric, EOMs intact Oropharynx clear, dentition in good repair No cervical or supraclavicular adenopathy Lungs no rales or rhonchi Heart regular rate and rhythm Abd soft, nontender, positive bowel sounds MSK no focal spinal tenderness, no upper extremity lymphedema Neuro: nonfocal, well oriented, appropriate affect Skin:  Mild desquamation over sun-exposed areas   LAB RESULTS:  Lab Results  Component Value Date   WBC 3.2* 12/28/2015   NEUTROABS 2.4 12/28/2015   HGB 10.5* 12/28/2015   HCT 32.1* 12/28/2015   MCV 97.9 12/28/2015   PLT 139* 12/28/2015      Chemistry      Component Value Date/Time   NA 139 12/28/2015 0918   NA 135 12/23/2015 0434   K 4.0 12/28/2015 0918   K 3.9 12/23/2015 0434   CL 104 12/23/2015 0434   CL 107 03/10/2013 1352   CO2 25 12/28/2015 0918   CO2 21* 12/23/2015 0434   BUN 21.9 12/28/2015 0918   BUN 14 12/23/2015 0434   CREATININE 1.1 12/28/2015 0918   CREATININE 0.95 12/23/2015 0434      Component Value Date/Time   CALCIUM 9.6 12/28/2015 0918   CALCIUM 8.0* 12/23/2015 0434   ALKPHOS 55 12/28/2015 0918   ALKPHOS 60 12/23/2015 0434   AST 20 12/28/2015 0918   AST 24 12/23/2015 0434   ALT 34 12/28/2015 4920  ALT 23 12/23/2015 0434   BILITOT 0.55 12/28/2015 0918    BILITOT 0.6 12/23/2015 0434    Results for BERNELL, SIGAL (MRN [id number]) as of 01/07/2016 16:37  Ref. Range 12/23/2015 12:12 12/24/2015 04:20 12/24/2015 12:59 12/28/2015 09:18 12/28/2015 09:18  M-SPIKE, % Latest Ref Range: Not Observed g/dL    0.2 (H)   Results for NIRANJAN, RUFENER (MRN [id number]) as of 01/07/2016 16:37  Ref. Range 12/23/2015 12:12 12/24/2015 04:20 12/24/2015 12:59 12/28/2015 09:18 12/28/2015 09:18  Kappa, lamda light chain ratio Latest Ref Range: 0.26-1.65   1.11       STUDIES: Dg Chest 2 View  12/22/2015  CLINICAL DATA:  Worsening fatigue and confusion.  Neutropenia. EXAM: CHEST  2 VIEW COMPARISON:  12/19/2015 FINDINGS: The heart size and mediastinal contours are within normal limits. Both lungs are clear. The visualized skeletal structures are unremarkable. IMPRESSION: No active cardiopulmonary disease. Electronically Signed   By: Andreas Newport M.D.   On: 12/22/2015 18:26   Dg Chest 2 View  12/19/2015  CLINICAL DATA:  Shortness of breath and cold symptoms, initial encounter EXAM: CHEST  2 VIEW COMPARISON:  07/08/2015 FINDINGS: Cardiac shadow is within normal limits. The lungs are clear bilaterally. Bony structures are within normal limits. IMPRESSION: No active cardiopulmonary disease. Electronically Signed   By: Inez Catalina M.D.   On: 12/19/2015 21:11   Ct Chest W Contrast  12/23/2015  CLINICAL DATA:  Worsening nonproductive cough, wheezing, and shortness of breath. Fever. Multiple myeloma undergoing chemotherapy. Former smoker. EXAM: CT CHEST WITH CONTRAST TECHNIQUE: Multidetector CT imaging of the chest was performed during intravenous contrast administration. CONTRAST:  51m ISOVUE-300 IOPAMIDOL (ISOVUE-300) INJECTION 61% COMPARISON:  PET-CT on 04/01/2013 FINDINGS: Mediastinum/Lymph Nodes: Mildly enlarged right hilar lymph node is seen measuring 13 mm in short axis on image 40/series 2. No other pathologically enlarged lymph nodes identified. Heart size is normal. Coronary artery  calcification noted. Lungs/Pleura: New patchy areas of airspace disease are seen in both lower lobes as well as other small poorly defined nodular densities in the lower lobes and the left upper lobe. These findings are suspicious for infectious or inflammatory process. No evidence of pleural effusion. Upper abdomen: Tiny calcified gallstone seen, without evidence acute cholecystitis. Musculoskeletal: No focal lytic bone lesions identified. Mild T12 vertebral body compression fracture is seen which is new since 2014. IMPRESSION: New patchy areas of airspace disease and ill-defined nodular densities in both lungs, suspicious for infectious or inflammatory process. Consider short-term followup by CT in 3 months. New mild right hilar lymphadenopathy, which may be reactive in etiology. Recommend continued attention on follow-up CT. Mild T12 vertebral body compression fracture which is new since 2014 exam. Electronically Signed   By: JEarle GellM.D.   On: 12/23/2015 11:11   ASSESSMENT: 65y.o.  [place] man with a history of multiple myeloma diagnosed February of 2012, with an initial M-spike of 2.6 g/dL, IFE showing an IgG lambda paraprotein and free lambda light chains in the urine. Cytogenetics showed trisomy 175 Bone marrow biopsy showed a 22% plasmacytosis. Treated with   (1) bortezomib (subcutaneously), lenalidomide, and dexamethasone, with repeat bone marrow biopsy May of 2012 showing 10% plasmacytosis   (2) [facility] July of 2012   (3) on zoledronic acid started December of 2012, initially monthly, currently given every 3 months, most recent dose  12/07/2015  (4) low-dose lenalidomide resumed April 2013, interrupted several times.  Resumed again on  02/19/2013, ata dose of 5 mg daily, 21 days on and 7 days off, later further reduced to 7 days on, 7 days off  (5) CNS symptoms and abnormal brain MRI extensively evaluated by  neurology with no definitive diagnosis established  (6) rising M spike noted June 2014 but did not meet criteria for carfilzomib study  (7) on lenalidomide 25 mg daily, 14 days on, 7 days off, starting 04/18/2013, interrupted December 2014 because of rash;   (a) resumed January 2015 at 10 mg/ day at 21 days on/ 7 days off  (Eh) starting 08/18/2014 decreased to 10 mg/ day 14 days on, 7 days off because of cytopenias  (c) as of February 2016 was on 5 mg daily 7 days on 7 days off  (d) lenalidomide discontinued December 2016 with evidence of disease progression  (8) transient global amnesia 05/29/2015, resolved without intervention  (9) starting PVD 10/25/2015 w ASA 325 thromboprophylaxis, valacyclovir prophylaxis, last dose 12/17/2015  (a) pomalidomide 4 mg/d days 1-14  ([place]) bortezomib sQ days 2,5,9,12 of each 21 day cycle  (c) dexamethasome 20 mg two days a week  (10) metapneumovirus pneumonia April 2017  (a) completing course of steroids and week of bactrim mid April 2017  (11) resuming PVD 01/11/2016  PLAN:  [name]  Is ready to resume treatment, and that will be next Tuesday. We have put in the paperwork for his pomalidomide and he should be receiving that next week. He will resume the bortezomib for 20 02/05/2016 and he usually takes his dexamethasone on the bortezomib days. He is also scheduled back at [facility] for 20 01/06/2016 for additional testing.  With retransplant looming in a few weeks and I strongly urged him to get back into a more vigorous exercise program. We really want him to get as strong as possible before he undergoes the more aggressive treatment.   Otherwise I will see him again on May 2 and every 3 weeks thereafter. He knows to call for any problems that may develop before his next visit here.  Chauncey Cruel, MD 01/07/2016 t

## 2016-01-04 ENCOUNTER — Other Ambulatory Visit: Payer: Self-pay

## 2016-01-04 DIAGNOSIS — C9000 Multiple myeloma not having achieved remission: Secondary | ICD-10-CM

## 2016-01-04 DIAGNOSIS — R21 Rash and other nonspecific skin eruption: Secondary | ICD-10-CM

## 2016-01-04 MED ORDER — METHYLPREDNISOLONE 4 MG PO TBPK: ORAL_TABLET | ORAL | Status: DC

## 2016-01-04 NOTE — Telephone Encounter (Signed)
Patient called stating that he had a reaction to septra, he took his last tablet on Sunday and is struggling with a rash and itching.  Patient states he is taking 50 mg of benadryl twice daily however he continues to be very "itchy". Per Dr. [name] patient can start on the medrol dose pak, take 25 mg of benadryl, use some hydrocortisone cream.  Writer informed patient and sent the medrol dose pak to patient's pharmacy [facility] as requested.

## 2016-01-06 ENCOUNTER — Ambulatory Visit (INDEPENDENT_AMBULATORY_CARE_PROVIDER_SITE_OTHER): Payer: Commercial Managed Care - PPO | Admitting: Internal Medicine

## 2016-01-06 ENCOUNTER — Encounter: Payer: Self-pay | Admitting: Internal Medicine

## 2016-01-06 VITALS — BP 150/90 | HR 68 | Wt 184.0 lb

## 2016-01-06 DIAGNOSIS — J189 Pneumonia, unspecified organism: Secondary | ICD-10-CM

## 2016-01-06 DIAGNOSIS — R21 Rash and other nonspecific skin eruption: Secondary | ICD-10-CM | POA: Diagnosis not present

## 2016-01-06 DIAGNOSIS — C9000 Multiple myeloma not having achieved remission: Secondary | ICD-10-CM | POA: Diagnosis not present

## 2016-01-06 MED ORDER — ALBUTEROL SULFATE HFA 108 (90 BASE) MCG/ACT IN AERS: 2.0000 | INHALATION_SPRAY | RESPIRATORY_TRACT | Status: DC | PRN

## 2016-01-06 MED ORDER — ESCITALOPRAM OXALATE 10 MG PO TABS: 10.0000 mg | ORAL_TABLET | Freq: Every day | ORAL | Status: DC

## 2016-01-06 MED ORDER — ZOLPIDEM TARTRATE 5 MG PO TABS: 5.0000 mg | ORAL_TABLET | Freq: Every evening | ORAL | Status: DC | PRN

## 2016-01-06 NOTE — Progress Notes (Signed)
Subjective:  Patient ID: Andre [name], male    DOB: Aug 03, 1951  Age: 65 y.o. MRN: [id number]  CC: No chief complaint on file.   HPI Andre [name] presents for post-hosp f/u for PNA - hosp charts reviewed. C/o rash from Septra. F/u MM, hypothyroidism. Stem cell transplant at [facility] pending                                Outpatient Prescriptions Prior to Visit  Medication Sig Dispense Refill  . albuterol (PROVENTIL HFA;VENTOLIN HFA) 108 (90 Base) MCG/ACT inhaler Inhale 2 puffs into the lungs every 4 (four) hours as needed for wheezing or shortness of breath. 1 Inhaler 0  . aspirin 325 MG tablet Take 325 mg by mouth daily.    . Bisacodyl (DULCOLAX PO) Take 1 tablet by mouth daily as needed (for constipation). Reported on 12/07/2015    . cetirizine (ZYRTEC) 10 MG tablet Take 10 mg by mouth daily.    . cholecalciferol (VITAMIN D) 1000 UNITS tablet Take 1 tablet (1,000 Units total) by mouth daily. 100 tablet 3  . escitalopram (LEXAPRO) 10 MG tablet Take 1 tablet (10 mg total) by mouth daily. 30 tablet 5  . ketoconazole (NIZORAL) 2 % cream Apply 1 application topically daily. (Patient taking differently: Apply 1 application topically daily as needed for irritation. ) 15 g 0  . levothyroxine (SYNTHROID, LEVOTHROID) 150 MCG tablet Take 1 tablet by mouth  daily 90 tablet 3  . methylPREDNISolone (MEDROL DOSEPAK) 4 MG TBPK tablet Take 6 tabs after breakfast decreasing by one tablet daily until medication is gone. 21 tablet 0  . metoCLOPramide (REGLAN) 5 MG tablet Take 1 tablet (5 mg total) by mouth every 6 (six) hours as needed for nausea. 90 tablet 1  . pantoprazole (PROTONIX) 40 MG tablet Take 1 tablet (40 mg total) by mouth 2 (two) times daily before a meal. 60 tablet 0  . polyethylene glycol (MIRALAX / GLYCOLAX) packet Take 17 g by mouth daily as needed for mild constipation.     . pomalidomide (POMALYST) 2 MG capsule Take 2 capsules (4 mg total) by mouth daily. Take with water on days  1-21. Repeat every 28 days. 42 capsule 6  . predniSONE (DELTASONE) 10 MG tablet Take 4 tabs daily for 2 days, then 3 tabs daily for 2 days, then 2 tabs daily for 2 days, then 1 tab daily for 2 days, then stop. 20 tablet 0  . senna (SENOKOT) 8.6 MG tablet Take 1 tablet by mouth 2 (two) times daily. Reported on 12/07/2015    . valACYclovir (VALTREX) 1000 MG tablet Take 1 tablet (1,000 mg total) by mouth daily. 60 tablet 6  . zolpidem (AMBIEN) 5 MG tablet Take 1-2 tablets (5-10 mg total) by mouth at bedtime as needed for sleep. 30 tablet 1   No facility-administered medications prior to visit.    ROS Review of Systems  Constitutional: Positive for fatigue. Negative for appetite change and unexpected weight change.  HENT: Negative for congestion, nosebleeds, sneezing, sore throat and trouble swallowing.   Eyes: Negative for itching and visual disturbance.  Respiratory: Positive for cough. Negative for apnea, chest tightness, shortness of breath and wheezing.   Cardiovascular: Negative for chest pain, palpitations and leg swelling.  Gastrointestinal: Negative for nausea, diarrhea, blood in stool and abdominal distention.  Genitourinary: Negative for frequency and hematuria.  Musculoskeletal: Negative for back pain, joint swelling, gait problem  and neck pain.  Skin: Positive for color change, pallor and rash.  Neurological: Positive for weakness. Negative for dizziness, tremors and speech difficulty.  Psychiatric/Behavioral: Negative for suicidal ideas, sleep disturbance, dysphoric mood and agitation. The patient is not nervous/anxious.     Objective:  BP 150/90 mmHg  Pulse 68  Wt 184 lb (83.462 kg)  SpO2 97%  BP Readings from Last 3 Encounters:  01/06/16 150/90  12/28/15 131/77  12/26/15 131/65    Wt Readings from Last 3 Encounters:  01/06/16 184 lb (83.462 kg)  12/28/15 185 lb (83.915 kg)  12/23/15 185 lb 3.2 oz (84.006 kg)    Physical Exam  Constitutional: He is oriented to  person, place, and time. He appears well-developed. No distress.  NAD  HENT:  Mouth/Throat: Oropharynx is clear and moist.  Eyes: Conjunctivae are normal. Pupils are equal, round, and reactive to light.  Neck: Normal range of motion. No JVD present. No thyromegaly present.  Cardiovascular: Normal rate, regular rhythm, normal heart sounds and intact distal pulses.  Exam reveals no gallop and no friction rub.   No murmur heard. Pulmonary/Chest: Effort normal and breath sounds normal. No respiratory distress. He has no wheezes. He has no rales. He exhibits no tenderness.  Abdominal: Soft. Bowel sounds are normal. He exhibits no distension and no mass. There is no tenderness. There is no rebound and no guarding.  Musculoskeletal: Normal range of motion. He exhibits no edema or tenderness.  Lymphadenopathy:    He has no cervical adenopathy.  Neurological: He is alert and oriented to person, place, and time. He has normal reflexes. No cranial nerve deficit. He exhibits normal muscle tone. He displays a negative Romberg sign. Coordination and gait normal.  Skin: Skin is warm and dry. Rash noted. There is erythema. There is pallor.  Psychiatric: He has a normal mood and affect. His behavior is normal. Judgment and thought content normal.  flaky skin on face - peeling fainttrunk erythema  Lab Results  Component Value Date   WBC 3.2* 12/28/2015   HGB 10.5* 12/28/2015   HCT 32.1* 12/28/2015   PLT 139* 12/28/2015   GLUCOSE 88 12/28/2015   CHOL 181 07/08/2015   TRIG 191.0* 07/08/2015   HDL 38.90* 07/08/2015   LDLDIRECT 155.8 11/13/2012   LDLCALC 103* 07/08/2015   ALT 34 12/28/2015   AST 20 12/28/2015   NA 139 12/28/2015   K 4.0 12/28/2015   CL 104 12/23/2015   CREATININE 1.1 12/28/2015   BUN 21.9 12/28/2015   CO2 25 12/28/2015   TSH 0.358 12/23/2015   PSA 0.70 07/08/2015   INR 1.16 12/23/2015   HGBA1C 5.4 03/27/2012    Dg Chest 2 View  12/22/2015  CLINICAL DATA:  Worsening fatigue and  confusion.  Neutropenia. EXAM: CHEST  2 VIEW COMPARISON:  12/19/2015 FINDINGS: The heart size and mediastinal contours are within normal limits. Both lungs are clear. The visualized skeletal structures are unremarkable. IMPRESSION: No active cardiopulmonary disease. Electronically Signed   By: Andreas Newport M.D.   On: 12/22/2015 18:26   Ct Chest W Contrast  12/23/2015  CLINICAL DATA:  Worsening nonproductive cough, wheezing, and shortness of breath. Fever. Multiple myeloma undergoing chemotherapy. Former smoker. EXAM: CT CHEST WITH CONTRAST TECHNIQUE: Multidetector CT imaging of the chest was performed during intravenous contrast administration. CONTRAST:  74m ISOVUE-300 IOPAMIDOL (ISOVUE-300) INJECTION 61% COMPARISON:  PET-CT on 04/01/2013 FINDINGS: Mediastinum/Lymph Nodes: Mildly enlarged right hilar lymph node is seen measuring 13 mm in short axis on image 40/series  2. No other pathologically enlarged lymph nodes identified. Heart size is normal. Coronary artery calcification noted. Lungs/Pleura: New patchy areas of airspace disease are seen in both lower lobes as well as other small poorly defined nodular densities in the lower lobes and the left upper lobe. These findings are suspicious for infectious or inflammatory process. No evidence of pleural effusion. Upper abdomen: Tiny calcified gallstone seen, without evidence acute cholecystitis. Musculoskeletal: No focal lytic bone lesions identified. Mild T12 vertebral body compression fracture is seen which is new since 2014. IMPRESSION: New patchy areas of airspace disease and ill-defined nodular densities in both lungs, suspicious for infectious or inflammatory process. Consider short-term followup by CT in 3 months. New mild right hilar lymphadenopathy, which may be reactive in etiology. Recommend continued attention on follow-up CT. Mild T12 vertebral body compression fracture which is new since 2014 exam. Electronically Signed   By: Earle Gell M.D.    On: 12/23/2015 11:11    Assessment & Plan:   There are no diagnoses linked to this encounter. I am having Mr. Mees maintain his Bisacodyl (DULCOLAX PO), cetirizine, cholecalciferol, levothyroxine, escitalopram, zolpidem, valACYclovir, ketoconazole, polyethylene glycol, senna, metoCLOPramide, aspirin, predniSONE, pantoprazole, albuterol, pomalidomide, methylPREDNISolone, and bortezomib IV.  Meds ordered this encounter  Medications  . bortezomib IV (VELCADE) 3.5 MG injection    Sig: Inject 1.3 mg/m2 into the vein once.     Follow-up: No Follow-up on file.  [name], MD

## 2016-01-06 NOTE — Assessment & Plan Note (Signed)
Pt finished an abx He had a CXR f/u at [facility]

## 2016-01-06 NOTE — Assessment & Plan Note (Signed)
Stem cell transplant at [facility] pending

## 2016-01-06 NOTE — Assessment & Plan Note (Signed)
4/17 due to sulfa Resolving on Prednisone

## 2016-01-06 NOTE — Progress Notes (Signed)
Pre visit review using our clinic review tool, if applicable. No additional management support is needed unless otherwise documented below in the visit note. 

## 2016-01-07 ENCOUNTER — Telehealth: Payer: Self-pay | Admitting: Oncology

## 2016-01-07 ENCOUNTER — Ambulatory Visit (HOSPITAL_BASED_OUTPATIENT_CLINIC_OR_DEPARTMENT_OTHER): Payer: Commercial Managed Care - PPO | Admitting: Oncology

## 2016-01-07 VITALS — BP 118/78 | HR 82 | Temp 97.6°F | Resp 18 | Ht 70.0 in | Wt 183.7 lb

## 2016-01-07 DIAGNOSIS — C9002 Multiple myeloma in relapse: Secondary | ICD-10-CM

## 2016-01-07 MED ORDER — DEXAMETHASONE 4 MG PO TABS
20.0000 mg | ORAL_TABLET | ORAL | Status: DC
Start: 2016-01-07 — End: 2016-02-21

## 2016-01-07 NOTE — Telephone Encounter (Signed)
appt made and avs printed. Staff message sent to override GM 5/2 sch at 8 am

## 2016-01-09 ENCOUNTER — Other Ambulatory Visit: Payer: Self-pay | Admitting: Oncology

## 2016-01-10 ENCOUNTER — Telehealth: Payer: Self-pay | Admitting: Oncology

## 2016-01-10 DIAGNOSIS — Z006 Encounter for examination for normal comparison and control in clinical research program: Secondary | ICD-10-CM | POA: Insufficient documentation

## 2016-01-10 NOTE — Telephone Encounter (Signed)
Faxed 12/16 path report to [facility] [phone]

## 2016-01-11 ENCOUNTER — Other Ambulatory Visit: Payer: Commercial Managed Care - PPO

## 2016-01-11 ENCOUNTER — Telehealth: Payer: Self-pay | Admitting: Oncology

## 2016-01-11 ENCOUNTER — Ambulatory Visit: Payer: Commercial Managed Care - PPO

## 2016-01-11 NOTE — Telephone Encounter (Signed)
Pt called to cx appt....done..md confirmed cx.Marland KitchenMarland KitchenMarland Kitchen

## 2016-01-14 ENCOUNTER — Ambulatory Visit: Payer: Commercial Managed Care - PPO

## 2016-01-14 ENCOUNTER — Other Ambulatory Visit: Payer: Commercial Managed Care - PPO

## 2016-01-18 ENCOUNTER — Ambulatory Visit: Payer: Commercial Managed Care - PPO | Admitting: Oncology

## 2016-01-18 ENCOUNTER — Other Ambulatory Visit: Payer: Commercial Managed Care - PPO

## 2016-01-18 ENCOUNTER — Ambulatory Visit: Payer: Commercial Managed Care - PPO

## 2016-01-21 ENCOUNTER — Other Ambulatory Visit: Payer: Commercial Managed Care - PPO

## 2016-01-21 ENCOUNTER — Ambulatory Visit: Payer: Commercial Managed Care - PPO

## 2016-02-15 DIAGNOSIS — D709 Neutropenia, unspecified: Secondary | ICD-10-CM | POA: Insufficient documentation

## 2016-02-17 ENCOUNTER — Telehealth: Payer: Self-pay | Admitting: Pharmacist

## 2016-02-17 NOTE — Telephone Encounter (Signed)
error 

## 2016-02-18 ENCOUNTER — Encounter: Payer: Self-pay | Admitting: *Deleted

## 2016-02-18 ENCOUNTER — Telehealth: Payer: Self-pay | Admitting: Oncology

## 2016-02-18 ENCOUNTER — Other Ambulatory Visit: Payer: Self-pay | Admitting: Oncology

## 2016-02-18 ENCOUNTER — Other Ambulatory Visit: Payer: Self-pay | Admitting: *Deleted

## 2016-02-18 DIAGNOSIS — C9000 Multiple myeloma not having achieved remission: Secondary | ICD-10-CM

## 2016-02-18 NOTE — Progress Notes (Signed)
Received call from [facility] regarding patient being discharged today. Per Dr. [name], patient to be seen on Monday, 02/21/16 at 9:30 and labs at 9:00 am. Discharging nurse at [facility], is to inform patient of appointments at [facility]. POF sent to scheduler.

## 2016-02-18 NOTE — Telephone Encounter (Signed)
spoke w/ pt confirmed 6/5 apt

## 2016-02-21 ENCOUNTER — Other Ambulatory Visit (HOSPITAL_BASED_OUTPATIENT_CLINIC_OR_DEPARTMENT_OTHER): Payer: Commercial Managed Care - PPO

## 2016-02-21 ENCOUNTER — Ambulatory Visit (HOSPITAL_BASED_OUTPATIENT_CLINIC_OR_DEPARTMENT_OTHER): Payer: Commercial Managed Care - PPO | Admitting: Oncology

## 2016-02-21 ENCOUNTER — Telehealth: Payer: Self-pay | Admitting: Oncology

## 2016-02-21 VITALS — BP 145/77 | HR 79 | Temp 98.5°F | Resp 18 | Ht 70.0 in | Wt 192.6 lb

## 2016-02-21 DIAGNOSIS — C9002 Multiple myeloma in relapse: Secondary | ICD-10-CM

## 2016-02-21 DIAGNOSIS — R531 Weakness: Secondary | ICD-10-CM | POA: Diagnosis not present

## 2016-02-21 DIAGNOSIS — C9000 Multiple myeloma not having achieved remission: Secondary | ICD-10-CM | POA: Diagnosis not present

## 2016-02-21 DIAGNOSIS — C9001 Multiple myeloma in remission: Secondary | ICD-10-CM

## 2016-02-21 LAB — CBC WITH DIFFERENTIAL/PLATELET
BASO%: 0.5 % (ref 0.0–2.0)
Basophils Absolute: 0 10*3/uL (ref 0.0–0.1)
EOS%: 0.2 % (ref 0.0–7.0)
Eosinophils Absolute: 0 10*3/uL (ref 0.0–0.5)
HEMATOCRIT: 30.2 % — AB (ref 38.4–49.9)
HEMOGLOBIN: 9.8 g/dL — AB (ref 13.0–17.1)
LYMPH#: 1.3 10*3/uL (ref 0.9–3.3)
LYMPH%: 21.3 % (ref 14.0–49.0)
MCH: 29.3 pg (ref 27.2–33.4)
MCHC: 32.5 g/dL (ref 32.0–36.0)
MCV: 90.1 fL (ref 79.3–98.0)
MONO#: 1.4 10*3/uL — AB (ref 0.1–0.9)
MONO%: 23.4 % — ABNORMAL HIGH (ref 0.0–14.0)
NEUT%: 54.6 % (ref 39.0–75.0)
NEUTROS ABS: 3.4 10*3/uL (ref 1.5–6.5)
NRBC: 1 % — AB (ref 0–0)
PLATELETS: 116 10*3/uL — AB (ref 140–400)
RBC: 3.35 10*6/uL — AB (ref 4.20–5.82)
RDW: 17.2 % — AB (ref 11.0–14.6)
WBC: 6.2 10*3/uL (ref 4.0–10.3)

## 2016-02-21 LAB — COMPREHENSIVE METABOLIC PANEL
ALBUMIN: 3 g/dL — AB (ref 3.5–5.0)
ALK PHOS: 60 U/L (ref 40–150)
ALT: 13 U/L (ref 0–55)
ANION GAP: 9 meq/L (ref 3–11)
AST: 24 U/L (ref 5–34)
BUN: 7.6 mg/dL (ref 7.0–26.0)
CO2: 28 meq/L (ref 22–29)
CREATININE: 0.9 mg/dL (ref 0.7–1.3)
Calcium: 9.4 mg/dL (ref 8.4–10.4)
Chloride: 105 mEq/L (ref 98–109)
EGFR: 87 mL/min/{1.73_m2} — AB (ref >90)
Glucose: 75 mg/dl (ref 70–140)
Potassium: 4.4 mEq/L (ref 3.5–5.1)
Sodium: 141 mEq/L (ref 136–145)
TOTAL PROTEIN: 5.9 g/dL — AB (ref 6.4–8.3)

## 2016-02-21 LAB — MAGNESIUM: Magnesium: 2 mg/dl (ref 1.5–2.5)

## 2016-02-21 LAB — TECHNOLOGIST REVIEW

## 2016-02-21 NOTE — Telephone Encounter (Signed)
appt made and avs printed °

## 2016-02-21 NOTE — Progress Notes (Signed)
ID: FOY VANDUYNE   DOB: June 14, 1951  MR#: [id number]  JKD#:[id number]   PCP: [name] SU: OTHER MD: [name], [name], [address] [place] ([place]), Ardine Bjork  CHIEF COMPLAINT: Multiple myeloma  CURRENT TREATMENT: Observation, currently day 67 post autologous transplant  HISTORY OF PRESENT ILLNESS: From the original intake nodes:  The patient was worked up for peptic ulcer disease in August of 2011, with significant bleeding and anemia.  The patient was Helicobacter pylori negative.  He received some epinephrine when he had his EGD and then started on Protonix.  [name] anemia slowly resolved so that by September, his hemoglobin was up to 10.9 and by earlier this month, his hemoglobin was up to 12.5.    As part of his general workup, he was found to have a slightly elevated globulin fraction.  In September, his total protein was 8.3 with an albumin of 3.8.  In January, the total protein was 8.4 and albumin 3.6.  With persistence of this slight abnormality, Dr. Olevia Perches obtained serum immunofixation and SPEP.  The SPTP showed an M-spike of 2.67 grams.  A total IgG was 4,190.  Total IgA low at 28.  Total IgM low at 28 also.  The immunofixation showed a monoclonal IgG lambda paraprotein.  There were also monoclonal free lambda light chains present. With this information, the patient was referred for further evaluation.   A diagnosis of myeloma was confirmed by bone marrow biopsy and subsequebnt treatment is as detailed below  INTERVAL HISTORY: [name] returns today for follow-up of his multiple myeloma, accompanied by his wife, Louanne Belton.  Since the last visit here, he was called back to [facility] and received a second autologous transplant using the stem cells collected before his first transplant some years ago. More specifically, he received melphalan 200 mg/m on 02/03/2016 and then stem cell rescue the following day(3.4210 to the 6 cells per  kilogram). He was started on growth factors on day 5 and by 02/16/2016 is white cell count had recovered. He was released to home 06/67/2017  REVIEW OF SYSTEMS: [name] seems to be recovering remarkably well. Of course he feels moderately fatigued. He sleeps poorly. He takes Ambien some days and other days nothing or Benadryl. His ankles are swelling son. His appetite is poor. He's had some heartburn problems. He tells me he had "uncontrollable diarrhea" while at [facility], but that has resolved in his bowel movements are currently formed and soft. He feels forgetful. He says her balance is not perfect. He is a little bit depressed although that is no particularly noticeable in today's visit. He denies any intercurrent fevers, bleeding, bruising, or rash. He still has some peripheral neuropathy involving his toes. That is at baseline. A detailed review of systems was otherwise stable.  PAST MEDICAL HISTORY: Past Medical History  Diagnosis Date  . Hypertension   . Hyperlipidemia   . Duodenal ulcer   . Allergy   . Thyroid disease   . GERD (gastroesophageal reflux disease)   . Depression   . Anemia   . Hypothyroidism   . Asthma     no treatment x 20 years  . Arthritis   . Double vision     occurs at times   . Multiple myeloma 07/04/2011  Significant for peptic ulcer disease as noted above.  History of hyperlipidemia.  History of anxiety and depression.  History of GERD which is significantly improved with weight loss and history of reactive airway disease, possibly secondary  to the GERD which also improved with weight loss.  There was a history of obesity, now much improved secondary to exercise and diet.   PAST SURGICAL HISTORY: Past Surgical History  Procedure Laterality Date  . Cardiolite study  11/25/2003    NORMAL  . Bone marrow transplant  2011    for MM  . Tonsillectomy      FAMILY HISTORY Family History  Problem Relation Age of Onset  . Throat cancer Mother   . Hypertension Father    . Stroke Father   . Asthma Father   . Diabetes Father   . Pancreatic cancer Brother   [name] father died at the age of 58 following a stroke.  [name] mother died with cancer of the throat at the age of 36.   She had a history of depression and was a smoker.  Not clear how much alcohol she drank, according to the patient.  The patient had two brothers.  One died from the age of 72 from a "kidney problem."  The second one died at the age of 63 from pancreatic cancer. (This may have been duodenal cancer. 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  His wife of 37+ years was [name], but they are now divorced. In 2016 he remarried, his current wife's name is [name].  The patient has two daughters:  [name], who works in [place], who is expecting her second child April 2016. .  They both live here in [place]. 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  The patient is very close to his daughters.  Rehberg first daughter, [name], born 03/06/2013, apparently has Weber/Osler/Rendu. The patient is not a church attender.  He does derive a great deal of support from friends at the "[employer]" which he attends very regularly, and also participates in Al-Anon even though actually there is no alcohol or drug problem in the family.  He participates in this group and he gets quite a bit of support from it.     ADVANCED DIRECTIVES: In place  HEALTH MAINTENANCE: (updated 07/16/2013) Social History  Substance Use Topics  . Smoking status: Former Smoker -- 3 years    Quit date: 03/27/1969  . Smokeless tobacco: Never Used  . Alcohol Use: No     Colonoscopy:  PSA:  Sept 2014, normal/Dr. [name]  Lipid panel: Sept 2014/Dr. [name]   Allergies  Allergen Reactions  . Atorvastatin  Other (See Comments)  . Rosuvastatin     Other reaction(s): Liver Disorder  . Crestor [Rosuvastatin Calcium]     ADVERSE EFFECTS ON LIVER  . Lipitor [Atorvastatin Calcium]   . Septra [Sulfamethoxazole-Trimethoprim] Rash    Current Outpatient Prescriptions  Medication Sig Dispense Refill  . albuterol (PROVENTIL HFA;VENTOLIN HFA) 108 (90 Base) MCG/ACT inhaler Inhale 2 puffs into the lungs every 4 (four) hours as needed for wheezing or shortness of breath. 1 Inhaler 3  . aspirin 325 MG tablet Take 325 mg by mouth daily.    . Bisacodyl (DULCOLAX PO) Take 1 tablet by mouth daily as needed (for constipation). Reported on 12/07/2015    . cetirizine (ZYRTEC) 10 MG tablet Take 10 mg by mouth daily.    . cholecalciferol (VITAMIN D) 1000 UNITS tablet Take 1 tablet (1,000 Units total) by mouth  daily. 100 tablet 3  . dexamethasone (DECADRON) 4 MG tablet Take 5 tablets (20 mg total) by mouth 2 (two) times a week. 60 tablet 4  . escitalopram (LEXAPRO) 10 MG tablet Take 1 tablet (10 mg total) by mouth daily. 30 tablet 5  . ketoconazole (NIZORAL) 2 % cream Apply 1 application topically daily. (Patient taking differently: Apply 1 application topically daily as needed for irritation. ) 15 g 0  . levothyroxine (SYNTHROID, LEVOTHROID) 150 MCG tablet Take 1 tablet by mouth  daily 90 tablet 3  . metoCLOPramide (REGLAN) 5 MG tablet Take 1 tablet (5 mg total) by mouth every 6 (six) hours as needed for nausea. 90 tablet 1  . pantoprazole (PROTONIX) 40 MG tablet Take 1 tablet (40 mg total) by mouth 2 (two) times daily before a meal. 60 tablet 0  . polyethylene glycol (MIRALAX / GLYCOLAX) packet Take 17 g by mouth daily as needed for mild constipation.     . pomalidomide (POMALYST) 2 MG capsule Take 2 capsules (4 mg total) by mouth daily. Take with water on days 1-21. Repeat every 28 days. 42 capsule 6  . senna (SENOKOT) 8.6 MG tablet Take 1 tablet by mouth 2 (two) times daily. Reported on 12/07/2015    . valACYclovir  (VALTREX) 1000 MG tablet Take 1 tablet (1,000 mg total) by mouth daily. 60 tablet 6  . zolpidem (AMBIEN) 5 MG tablet Take 1-2 tablets (5-10 mg total) by mouth at bedtime as needed for sleep. 30 tablet 1   No current facility-administered medications for this visit.    OBJECTIVE: Middle-aged white manWho appears stated age 65 Vitals:   02/21/16 0947  BP: 145/77  Pulse: 79  Temp: 98.5 F (36.9 C)  Resp: 18   Body mass index is 27.64 kg/(m^2).  Filed Weights   02/21/16 0947  Weight: 192 lb 9.6 oz (87.363 kg)   Sclerae unicteric, pupils round and equal Oropharynx clear and moist-- no thrush or other lesions No cervical or supraclavicular adenopathy Lungs no rales or rhonchi Heart regular rate and rhythm Abd soft, nontender, positive bowel sounds MSK no focal spinal tenderness, minimal bilateral ankle edema Neuro: nonfocal, well oriented, appropriate affect    LAB RESULTS:  Lab Results  Component Value Date   WBC 6.2 02/21/2016   NEUTROABS 3.4 02/21/2016   HGB 9.8* 02/21/2016   HCT 30.2* 02/21/2016   MCV 90.1 02/21/2016   PLT 116* 02/21/2016      Chemistry      Component Value Date/Time   NA 141 02/21/2016 0931   NA 135 12/23/2015 0434   K 4.4 02/21/2016 0931   K 3.9 12/23/2015 0434   CL 104 12/23/2015 0434   CL 107 03/10/2013 1352   CO2 28 02/21/2016 0931   CO2 21* 12/23/2015 0434   BUN 7.6 02/21/2016 0931   BUN 14 12/23/2015 0434   CREATININE 0.9 02/21/2016 0931   CREATININE 0.95 12/23/2015 0434      Component Value Date/Time   CALCIUM 9.4 02/21/2016 0931   CALCIUM 8.0* 12/23/2015 0434   ALKPHOS 60 02/21/2016 0931   ALKPHOS 60 12/23/2015 0434   AST 24 02/21/2016 0931   AST 24 12/23/2015 0434   ALT 13 02/21/2016 0931   ALT 23 12/23/2015 0434   BILITOT <0.30 02/21/2016 0931   BILITOT 0.6 12/23/2015 0434     STUDIES: No results found. ASSESSMENT: 65 y.o.  [place] man with a history of multiple myeloma diagnosed February of 2012, with an  initial M-spike  of 2.6 g/dL, IFE showing an IgG lambda paraprotein and free lambda light chains in the urine. Cytogenetics showed trisomy 39. Bone marrow biopsy showed a 22% plasmacytosis. Treated with   (1) bortezomib (subcutaneously), lenalidomide, and dexamethasone, with repeat bone marrow biopsy May of 2012 showing 10% plasmacytosis   (2) high-dose chemotherapy with BCNU and melphalan at [facility], followed by stem cell rescue July of 2012   (3) on zoledronic acid started December of 2012, initially monthly, currently given every 3 months, most recent dose  12/07/2015  (4) low-dose lenalidomide resumed April 2013, interrupted several times.  Resumed again on 02/19/2013, ata dose of 5 mg daily, 21 days on and 7 days off, later further reduced to 7 days on, 7 days off  (5) CNS symptoms and abnormal brain MRI extensively evaluated by neurology with no definitive diagnosis established  (6) rising M spike noted June 2014 but did not meet criteria for carfilzomib study  (7) on lenalidomide 25 mg daily, 14 days on, 7 days off, starting 04/18/2013, interrupted December 2014 because of rash;   (a) resumed January 2015 at 10 mg/ day at 21 days on/ 7 days off  (Alexie) starting 08/18/2014 decreased to 10 mg/ day 14 days on, 7 days off because of cytopenias  (c) as of February 2016 was on 5 mg daily 7 days on 7 days off  (d) lenalidomide discontinued December 2016 with evidence of disease progression  (8) transient global amnesia 05/29/2015, resolved without intervention  (9) starting PVD 10/25/2015 w ASA 325 thromboprophylaxis, valacyclovir prophylaxis, last dose 12/17/2015  (a) pomalidomide 4 mg/d days 1-14  (Leonell) bortezomib sQ days 2,5,9,12 of each 21 day cycle  (c) dexamethasome 20 mg two days a week  (10) metapneumovirus pneumonia April 2017  (a) completing course of steroids and week of bactrim mid April 2017  (11) status post second autologous transplant at [facility] 02/04/2016(preparatory regimen  melphalan 200 mg/m)  PLAN:  [name] is currently day 18 from his stem cell rescue, with excellent tolerance. His white cell count in particular has recovered very nicely. Currently there is no evidence of infection or significant morbidity from his transplant.  Of course he remains somewhat weak. I think he could start a walking program so long as it doesn't expose himself to per sunlight. He's got some ankle swelling, and I suggested some compression stockings for that. Of course he can also elevate his legs when he is at rest. He has not had any further problems with diarrhea or constipation.  He understands that wearing a mask will not protect him from infection but it will warn other people that he is at risk and it may be useful in that regard.  I think it would be fine for him to go out to eat or go to church if he wishes at this point. He just needs to be meticulous with regards to hand washing.  We are going to check his labs on a weekly basis as per [facility]'s request. He will see Dr. Bernita Buffy Endo again at the end of this month. He is going to see me again mid July. I have encouraged him to call with any questions even if seemingly minor for the next several weeks. We can always working man as needed  At this point I am delighted that he has come through his second transplant so well. We will likely start maintenance again at some point under Dr. Kendell Bane direction.  Chauncey Cruel, MD 02/21/2016 t

## 2016-02-22 LAB — PROTEIN ELECTROPHORESIS, SERUM
A/G RATIO SPE: 1.3 (ref 0.7–1.7)
Albumin: 2.9 g/dL (ref 2.9–4.4)
Alpha 1: 0.3 g/dL (ref 0.0–0.4)
Alpha 2: 0.7 g/dL (ref 0.4–1.0)
BETA: 0.9 g/dL (ref 0.7–1.3)
GAMMA GLOBULIN: 0.4 g/dL (ref 0.4–1.8)
GLOBULIN, TOTAL: 2.3 g/dL (ref 2.2–3.9)
M-SPIKE, %: 0.1 g/dL — AB
Total Protein: 5.2 g/dL — ABNORMAL LOW (ref 6.0–8.5)

## 2016-02-22 LAB — PROTEIN / CREATININE RATIO, URINE
Creatinine, Urine: 28.5 mg/dL
PROTEIN,TOTAL,URINE: 4.7 mg/dL
Protein/Creat Ratio: 165 mg/g creat (ref 0–200)

## 2016-02-22 LAB — KAPPA/LAMBDA LIGHT CHAINS
Ig Kappa Free Light Chain: 9.7 mg/L (ref 3.3–19.4)
Ig Lambda Free Light Chain: 11.2 mg/L (ref 5.7–26.3)
Kappa/Lambda FluidC Ratio: 0.87 (ref 0.26–1.65)

## 2016-02-26 ENCOUNTER — Observation Stay (HOSPITAL_COMMUNITY)
Admission: EM | Admit: 2016-02-26 | Discharge: 2016-03-01 | Disposition: A | Discharge status: Home / Self Care | Payer: Commercial Managed Care - PPO | Attending: Family Medicine | Admitting: Family Medicine

## 2016-02-26 ENCOUNTER — Emergency Department (HOSPITAL_COMMUNITY): Payer: Commercial Managed Care - PPO

## 2016-02-26 ENCOUNTER — Encounter (HOSPITAL_COMMUNITY): Payer: Self-pay

## 2016-02-26 DIAGNOSIS — E039 Hypothyroidism, unspecified: Secondary | ICD-10-CM | POA: Insufficient documentation

## 2016-02-26 DIAGNOSIS — E785 Hyperlipidemia, unspecified: Secondary | ICD-10-CM | POA: Diagnosis not present

## 2016-02-26 DIAGNOSIS — M545 Low back pain, unspecified: Secondary | ICD-10-CM

## 2016-02-26 DIAGNOSIS — Z87891 Personal history of nicotine dependence: Secondary | ICD-10-CM | POA: Insufficient documentation

## 2016-02-26 DIAGNOSIS — Z79899 Other long term (current) drug therapy: Secondary | ICD-10-CM | POA: Diagnosis not present

## 2016-02-26 DIAGNOSIS — Z7982 Long term (current) use of aspirin: Secondary | ICD-10-CM | POA: Insufficient documentation

## 2016-02-26 DIAGNOSIS — M1990 Unspecified osteoarthritis, unspecified site: Secondary | ICD-10-CM | POA: Diagnosis not present

## 2016-02-26 DIAGNOSIS — Z9484 Stem cells transplant status: Secondary | ICD-10-CM | POA: Insufficient documentation

## 2016-02-26 DIAGNOSIS — E860 Dehydration: Secondary | ICD-10-CM | POA: Diagnosis not present

## 2016-02-26 DIAGNOSIS — C9000 Multiple myeloma not having achieved remission: Principal | ICD-10-CM | POA: Insufficient documentation

## 2016-02-26 DIAGNOSIS — R5381 Other malaise: Secondary | ICD-10-CM | POA: Diagnosis present

## 2016-02-26 DIAGNOSIS — R509 Fever, unspecified: Secondary | ICD-10-CM

## 2016-02-26 DIAGNOSIS — D649 Anemia, unspecified: Secondary | ICD-10-CM | POA: Diagnosis present

## 2016-02-26 DIAGNOSIS — J45909 Unspecified asthma, uncomplicated: Secondary | ICD-10-CM | POA: Insufficient documentation

## 2016-02-26 DIAGNOSIS — M549 Dorsalgia, unspecified: Secondary | ICD-10-CM | POA: Diagnosis present

## 2016-02-26 DIAGNOSIS — I10 Essential (primary) hypertension: Secondary | ICD-10-CM | POA: Diagnosis not present

## 2016-02-26 DIAGNOSIS — R21 Rash and other nonspecific skin eruption: Secondary | ICD-10-CM | POA: Diagnosis present

## 2016-02-26 DIAGNOSIS — D638 Anemia in other chronic diseases classified elsewhere: Secondary | ICD-10-CM | POA: Diagnosis present

## 2016-02-26 LAB — URINE MICROSCOPIC-ADD ON

## 2016-02-26 LAB — COMPREHENSIVE METABOLIC PANEL
ALK PHOS: 61 U/L (ref 38–126)
ALT: 20 U/L (ref 17–63)
ANION GAP: 8 (ref 5–15)
AST: 34 U/L (ref 15–41)
Albumin: 3.5 g/dL (ref 3.5–5.0)
BUN: 14 mg/dL (ref 6–20)
CO2: 26 mmol/L (ref 22–32)
CREATININE: 0.96 mg/dL (ref 0.61–1.24)
Calcium: 9.1 mg/dL (ref 8.9–10.3)
Chloride: 102 mmol/L (ref 101–111)
Glucose, Bld: 98 mg/dL (ref 65–99)
Potassium: 3.8 mmol/L (ref 3.5–5.1)
Sodium: 136 mmol/L (ref 135–145)
TOTAL PROTEIN: 6.6 g/dL (ref 6.5–8.1)
Total Bilirubin: 0.4 mg/dL (ref 0.3–1.2)

## 2016-02-26 LAB — CBC WITH DIFFERENTIAL/PLATELET
BASOS PCT: 0 %
Basophils Absolute: 0 10*3/uL (ref 0.0–0.1)
EOS ABS: 0 10*3/uL (ref 0.0–0.7)
EOS PCT: 0 %
HCT: 31.4 % — ABNORMAL LOW (ref 39.0–52.0)
HEMOGLOBIN: 10.7 g/dL — AB (ref 13.0–17.0)
LYMPHS PCT: 20 %
Lymphs Abs: 1 10*3/uL (ref 0.7–4.0)
MCH: 30.1 pg (ref 26.0–34.0)
MCHC: 34.1 g/dL (ref 30.0–36.0)
MCV: 88.5 fL (ref 78.0–100.0)
MONO ABS: 1 10*3/uL (ref 0.1–1.0)
Monocytes Relative: 20 %
NEUTROS ABS: 2.9 10*3/uL (ref 1.7–7.7)
NEUTROS PCT: 60 %
Platelets: 174 10*3/uL (ref 150–400)
RBC: 3.55 MIL/uL — ABNORMAL LOW (ref 4.22–5.81)
RDW: 18.1 % — ABNORMAL HIGH (ref 11.5–15.5)
WBC: 4.9 10*3/uL (ref 4.0–10.5)

## 2016-02-26 LAB — TROPONIN I: Troponin I: <0.03 ng/mL (ref <0.031)

## 2016-02-26 LAB — URINALYSIS, ROUTINE W REFLEX MICROSCOPIC
Bilirubin Urine: NEGATIVE
Glucose, UA: NEGATIVE mg/dL
KETONES UR: NEGATIVE mg/dL
LEUKOCYTES UA: NEGATIVE
NITRITE: NEGATIVE
PH: 7 (ref 5.0–8.0)
PROTEIN: NEGATIVE mg/dL
SPECIFIC GRAVITY, URINE: 1.017 (ref 1.005–1.030)

## 2016-02-26 LAB — I-STAT CG4 LACTIC ACID, ED: Lactic Acid, Venous: 2.19 mmol/L (ref 0.5–2.0)

## 2016-02-26 LAB — TSH: TSH: 6.5 u[IU]/mL — ABNORMAL HIGH (ref 0.350–4.500)

## 2016-02-26 MED ORDER — BISACODYL 5 MG PO TBEC: 5.0000 mg | DELAYED_RELEASE_TABLET | Freq: Every day | ORAL | Status: DC | PRN

## 2016-02-26 MED ORDER — ALBUTEROL SULFATE HFA 108 (90 BASE) MCG/ACT IN AERS: 2.0000 | INHALATION_SPRAY | RESPIRATORY_TRACT | Status: DC | PRN

## 2016-02-26 MED ORDER — ONDANSETRON HCL 4 MG/2ML IJ SOLN
4.0000 mg | Freq: Four times a day (QID) | INTRAMUSCULAR | Status: DC | PRN
  Administered 2016-02-27: 4 mg via INTRAVENOUS
  Filled 2016-02-26: qty 2

## 2016-02-26 MED ORDER — SODIUM CHLORIDE 0.9 % IV SOLN
INTRAVENOUS | Status: AC
  Administered 2016-02-27: via INTRAVENOUS

## 2016-02-26 MED ORDER — SODIUM CHLORIDE 0.9 % IV BOLUS (SEPSIS)
1000.0000 mL | Freq: Once | INTRAVENOUS | Status: AC
  Administered 2016-02-26: 1000 mL via INTRAVENOUS

## 2016-02-26 MED ORDER — OXYCODONE HCL 5 MG PO TABS: 5.0000 mg | ORAL_TABLET | Freq: Four times a day (QID) | ORAL | Status: DC | PRN

## 2016-02-26 MED ORDER — DOCUSATE SODIUM 100 MG PO CAPS: 100.0000 mg | ORAL_CAPSULE | Freq: Every day | ORAL | Status: DC | PRN

## 2016-02-26 MED ORDER — ZOLPIDEM TARTRATE 5 MG PO TABS
5.0000 mg | ORAL_TABLET | Freq: Every evening | ORAL | Status: DC | PRN
  Administered 2016-02-27: 5 mg via ORAL
  Filled 2016-02-26: qty 1

## 2016-02-26 MED ORDER — ESCITALOPRAM OXALATE 10 MG PO TABS
10.0000 mg | ORAL_TABLET | Freq: Every day | ORAL | Status: DC
Start: 2016-02-27 — End: 2016-03-01
  Administered 2016-02-27 – 2016-02-29 (×4): 10 mg via ORAL
  Filled 2016-02-26 (×4): qty 1

## 2016-02-26 MED ORDER — ACETAMINOPHEN 650 MG RE SUPP: 650.0000 mg | Freq: Four times a day (QID) | RECTAL | Status: DC | PRN

## 2016-02-26 MED ORDER — FAMOTIDINE 20 MG PO TABS
10.0000 mg | ORAL_TABLET | Freq: Every day | ORAL | Status: DC
  Administered 2016-02-27: 10 mg via ORAL
  Filled 2016-02-26: qty 1

## 2016-02-26 MED ORDER — LORAZEPAM 0.5 MG PO TABS: 0.5000 mg | ORAL_TABLET | Freq: Four times a day (QID) | ORAL | Status: DC | PRN

## 2016-02-26 MED ORDER — LORATADINE 10 MG PO TABS
10.0000 mg | ORAL_TABLET | Freq: Every day | ORAL | Status: DC
  Administered 2016-02-27 – 2016-03-01 (×4): 10 mg via ORAL
  Filled 2016-02-26 (×4): qty 1

## 2016-02-26 MED ORDER — ONDANSETRON HCL 4 MG PO TABS: 4.0000 mg | ORAL_TABLET | Freq: Four times a day (QID) | ORAL | Status: DC | PRN

## 2016-02-26 MED ORDER — POLYETHYLENE GLYCOL 3350 17 G PO PACK: 17.0000 g | PACK | Freq: Every day | ORAL | Status: DC | PRN

## 2016-02-26 MED ORDER — VALACYCLOVIR HCL 500 MG PO TABS
500.0000 mg | ORAL_TABLET | Freq: Every day | ORAL | Status: DC
  Administered 2016-02-27 – 2016-03-01 (×4): 500 mg via ORAL
  Filled 2016-02-26 (×4): qty 1

## 2016-02-26 MED ORDER — PROCHLORPERAZINE MALEATE 10 MG PO TABS: 10.0000 mg | ORAL_TABLET | Freq: Four times a day (QID) | ORAL | Status: DC | PRN

## 2016-02-26 MED ORDER — MORPHINE SULFATE (PF) 4 MG/ML IV SOLN
4.0000 mg | Freq: Once | INTRAVENOUS | Status: AC
  Administered 2016-02-26: 4 mg via INTRAVENOUS
  Filled 2016-02-26: qty 1

## 2016-02-26 MED ORDER — ONDANSETRON HCL 4 MG/2ML IJ SOLN
4.0000 mg | Freq: Once | INTRAMUSCULAR | Status: AC
  Administered 2016-02-26: 4 mg via INTRAVENOUS
  Filled 2016-02-26: qty 2

## 2016-02-26 MED ORDER — ACETAMINOPHEN 325 MG PO TABS
650.0000 mg | ORAL_TABLET | Freq: Four times a day (QID) | ORAL | Status: DC | PRN
  Administered 2016-03-01: 650 mg via ORAL
  Filled 2016-02-26: qty 2

## 2016-02-26 MED ORDER — PANTOPRAZOLE SODIUM 40 MG PO TBEC: 40.0000 mg | DELAYED_RELEASE_TABLET | Freq: Every day | ORAL | Status: DC | PRN

## 2016-02-26 MED ORDER — HYDROCODONE-ACETAMINOPHEN 5-325 MG PO TABS
1.0000 | ORAL_TABLET | ORAL | Status: DC | PRN
  Administered 2016-02-27: 2 via ORAL
  Filled 2016-02-26: qty 2

## 2016-02-26 MED ORDER — LEVOTHYROXINE SODIUM 100 MCG PO TABS
200.0000 ug | ORAL_TABLET | Freq: Every day | ORAL | Status: DC
  Administered 2016-02-27 – 2016-02-29 (×3): 200 ug via ORAL
  Filled 2016-02-26 (×3): qty 2

## 2016-02-26 MED ORDER — ASPIRIN 325 MG PO TABS
325.0000 mg | ORAL_TABLET | Freq: Every day | ORAL | Status: DC
  Administered 2016-02-27 – 2016-03-01 (×4): 325 mg via ORAL
  Filled 2016-02-26 (×4): qty 1

## 2016-02-26 NOTE — H&P (Signed)
Andre [name] LGX:[id number] DOB: September 18, 1951 DOA: 02/26/2016     PCP: Andre Kehr, MD   Outpatient Specialists: [facility] [place] Patient coming from:  home Lives  With family    Chief Complaint: Generalized fatigue  HPI: Andre [name] is a 65 y.o. male with medical history significant of HTN.  peptic ulcer disease chronic anemia, multiple myeloma    Presented with generalized fatigue he became so tired that when he fell down today he couldn't even get up. Denies any chest pain or shortness of breath no high fevers no chills does report some low-grade fevers.  He has been falling while trying to get out of bed. Feels like has no strength to stand up. Reports ever since his transplant his been feeling weaker. He denies any loss of consciousness no head injury. EMS was called  Patient endorses back pain family states that generalized weakness have gotten worse since back pain started. Pain is over lumbar spine somewhat to her right, likely lifting his right leg has made the pain worse. Regarding pertinent Chronic problems: Multiple myeloma being followed at [facility] status post 2 autologous transplants last one was on 02/03/2016 Patient has history of peptic ulcer disease diagnosed in 2011 the significant bleeding and anemia  IN ER: MAXIMUM TEMPERATURE 99.1 heart rate up to 90 blood pressure 125/89 WBC 4.9 hemoglobin 10.7 crit 0.96 troponin less than 0.03 lactic acid elevated to 19 TSH elevated at 6.5  Chest x-ray unremarkable Plain films of the back showing stable old T12 compression deformity no acute fracture  ER discuss case with oncology Dr. Burr Medico Hospitalist was called for admission for dehydration and debility frequent falls  Review of Systems:    Pertinent positives include: Low-grade feversfatigue,  Constitutional:  No weight loss, night sweats, Fevers, chills,  weight loss  HEENT:  No headaches, Difficulty swallowing,Tooth/dental problems,Sore throat,  No sneezing,  itching, ear ache, nasal congestion, post nasal drip,  Cardio-vascular:  No chest pain, Orthopnea, PND, anasarca, dizziness, palpitations.no Bilateral lower extremity swelling  GI:  No heartburn, indigestion, abdominal pain, nausea, vomiting, diarrhea, change in bowel habits, loss of appetite, melena, blood in stool, hematemesis Resp:  no shortness of breath at rest. No dyspnea on exertion, No excess mucus, no productive cough, No non-productive cough, No coughing up of blood.No change in color of mucus.No wheezing. Skin:  no rash or lesions. No jaundice GU:  no dysuria, change in color of urine, no urgency or frequency. No straining to urinate.  No flank pain.  Musculoskeletal:  No joint pain or no joint swelling. No decreased range of motion. No back pain.  Psych:  No change in mood or affect. No depression or anxiety. No memory loss.  Neuro: no localizing neurological complaints, no tingling, no weakness, no double vision, no gait abnormality, no slurred speech, no confusion  As per HPI otherwise 10 point review of systems negative.   Past Medical History: Past Medical History  Diagnosis Date  . Hypertension   . Hyperlipidemia   . Duodenal ulcer   . Allergy   . Thyroid disease   . GERD (gastroesophageal reflux disease)   . Depression   . Anemia   . Hypothyroidism   . Asthma     no treatment x 20 years  . Arthritis   . Double vision     occurs at times   . Multiple myeloma 07/04/2011   Past Surgical History  Procedure Laterality Date  . Cardiolite study  11/25/2003  NORMAL  . Bone marrow transplant  2011    for MM  . Tonsillectomy       Social History:  Ambulatory   independently      reports that he quit smoking about 46 years ago. He has never used smokeless tobacco. He reports that he does not drink alcohol or use illicit drugs.  Allergies:   Allergies  Allergen Reactions  . Atorvastatin Other (See Comments)  . Rosuvastatin     Other reaction(s):  Liver Disorder  . Crestor [Rosuvastatin Calcium]     ADVERSE EFFECTS ON LIVER  . Lipitor [Atorvastatin Calcium]   . Septra [Sulfamethoxazole-Trimethoprim] Rash       Family History:    Family History  Problem Relation Age of Onset  . Throat cancer Mother   . Hypertension Father   . Stroke Father   . Asthma Father   . Diabetes Father   . Pancreatic cancer Brother     Medications: Prior to Admission medications   Medication Sig Start Date End Date Taking? Authorizing Provider  albuterol (PROVENTIL HFA;VENTOLIN HFA) 108 (90 Base) MCG/ACT inhaler Inhale 2 puffs into the lungs every 4 (four) hours as needed for wheezing or shortness of breath. 01/06/16  Yes Evie Lacks [name], MD  aspirin 325 MG tablet Take 325 mg by mouth daily.   Yes Historical Provider, MD  Bisacodyl (DULCOLAX PO) Take 1 tablet by mouth daily as needed (for constipation). Reported on 12/07/2015   Yes Historical Provider, MD  cetirizine (ZYRTEC) 10 MG tablet Take 10 mg by mouth daily.   Yes Historical Provider, MD  docusate sodium (COLACE) 100 MG capsule Take 100 mg by mouth daily as needed for mild constipation or moderate constipation.   Yes Historical Provider, MD  escitalopram (LEXAPRO) 10 MG tablet Take 1 tablet (10 mg total) by mouth daily. 01/06/16  Yes Cassandria Anger, MD  levothyroxine (SYNTHROID, LEVOTHROID) 150 MCG tablet Take 1 tablet by mouth  daily Patient taking differently: Take 150 mcg by mouth daily 07/08/15  Yes Evie Lacks [name], MD  LORazepam (ATIVAN) 1 MG tablet Take 0.5-1 mg by mouth every 6 (six) hours as needed for anxiety (nausea).  02/07/16  Yes Historical Provider, MD  ondansetron (ZOFRAN-ODT) 8 MG disintegrating tablet Take 8 mg by mouth every 8 (eight) hours as needed for nausea or vomiting.  02/02/16 03/03/16 Yes Historical Provider, MD  oxyCODONE (OXY IR/ROXICODONE) 5 MG immediate release tablet Take 5 mg by mouth every 6 (six) hours as needed for moderate pain or severe pain.  02/02/16   Yes Historical Provider, MD  pantoprazole (PROTONIX) 40 MG tablet Take 1 tablet (40 mg total) by mouth 2 (two) times daily before a meal. Patient taking differently: Take 40 mg by mouth daily as needed (reflux).  12/26/15  Yes Modena Jansky, MD  polyethylene glycol (MIRALAX / GLYCOLAX) packet Take 17 g by mouth daily as needed for mild constipation or moderate constipation.    Yes Historical Provider, MD  prochlorperazine (COMPAZINE) 10 MG tablet Take 10 mg by mouth every 6 (six) hours as needed for nausea or vomiting.  02/02/16  Yes Historical Provider, MD  ranitidine (ZANTAC) 150 MG tablet Take 150 mg by mouth 2 (two) times daily. 02/03/16  Yes Historical Provider, MD  valACYclovir (VALTREX) 1000 MG tablet Take 1 tablet (1,000 mg total) by mouth daily. Patient taking differently: Take 500 mg by mouth daily.  10/22/15  Yes Chauncey Cruel, MD  zolendronic acid (ZOMETA) 4 MG/5ML injection  Inject 4 mg into the vein every 3 (three) months.   Yes Historical Provider, MD  zolpidem (AMBIEN) 5 MG tablet Take 1-2 tablets (5-10 mg total) by mouth at bedtime as needed for sleep. 01/06/16  Yes Cassandria Anger, MD  cholecalciferol (VITAMIN D) 1000 UNITS tablet Take 1 tablet (1,000 Units total) by mouth daily. Patient not taking: Reported on 02/26/2016 03/09/15   Cassandria Anger, MD    Physical Exam: Patient Vitals for the past 24 hrs:  BP Temp Temp src Pulse Resp SpO2  02/26/16 2037 125/89 mmHg 99 F (37.2 C) Oral 88 17 94 %  02/26/16 1854 - 99.1 F (37.3 C) Oral 90 18 97 %    1. General:  in No Acute distress 2. Psychological: Alert and   Oriented 3. Head/ENT:    Dry Mucous Membranes                          Head Non traumatic, neck supple                           Poor Dentition 4. SKIN:   decreased Skin turgor,  Skin clean Dry and intact no rash 5. Heart: Regular rate and rhythm no Murmur, Rub or gallop 6. Lungs: Clear to auscultation bilaterally, no wheezes or crackles   7. Abdomen: Soft,  non-tender, Non distended 8. Lower extremities: no clubbing, cyanosis, or edema 9. Neurologically Strength 5 out of 5 in all 4 extremities cranial nerves II through XII intact   10. MSK: Normal range of motion, pain to palpation over lumbar spine   body mass index is unknown because there is no weight on file.  Labs on Admission:   Labs on Admission: I have personally reviewed following labs and imaging studies  CBC:  Recent Labs Lab 02/21/16 0931 02/26/16 1923  WBC 6.2 4.9  NEUTROABS 3.4 2.9  HGB 9.8* 10.7*  HCT 30.2* 31.4*  MCV 90.1 88.5  PLT 116* 767   Basic Metabolic Panel:  Recent Labs Lab 02/21/16 0931 02/26/16 1923  NA 141 136  K 4.4 3.8  CL  --  102  CO2 28 26  GLUCOSE 75 98  BUN 7.6 14  CREATININE 0.9 0.96  CALCIUM 9.4 9.1  MG 2.0  --    GFR: Estimated Creatinine Clearance: 78.2 mL/min (by C-G formula based on Cr of 0.96). Liver Function Tests:  Recent Labs Lab 02/21/16 0931 02/26/16 1923  AST 24 34  ALT 13 20  ALKPHOS 60 61  BILITOT <0.30 0.4  PROT 5.9*  5.2* 6.6  ALBUMIN 3.0* 3.5   No results for input(s): LIPASE, AMYLASE in the last 168 hours. No results for input(s): AMMONIA in the last 168 hours. Coagulation Profile: No results for input(s): INR, PROTIME in the last 168 hours. Cardiac Enzymes:  Recent Labs Lab 02/26/16 1923  TROPONINI <0.03   BNP (last 3 results) No results for input(s): PROBNP in the last 8760 hours. HbA1C: No results for input(s): HGBA1C in the last 72 hours. CBG: No results for input(s): GLUCAP in the last 168 hours. Lipid Profile: No results for input(s): CHOL, HDL, LDLCALC, TRIG, CHOLHDL, LDLDIRECT in the last 72 hours. Thyroid Function Tests:  Recent Labs  02/26/16 1924  TSH 6.500*   Anemia Panel: No results for input(s): VITAMINB12, FOLATE, FERRITIN, TIBC, IRON, RETICCTPCT in the last 72 hours. Urine analysis:    Component Value Date/Time  COLORURINE YELLOW 02/26/2016 2039   APPEARANCEUR  CLEAR 02/26/2016 2039   APPEARANCEUR Clear 12/22/2015 1614   LABSPEC 1.017 02/26/2016 2039   PHURINE 7.0 02/26/2016 2039   GLUCOSEU NEGATIVE 02/26/2016 2039   GLUCOSEU NEGATIVE 07/08/2015 1050   HGBUR SMALL* 02/26/2016 2039   BILIRUBINUR NEGATIVE 02/26/2016 2039   BILIRUBINUR Negative 12/22/2015 [place] 02/26/2016 2039   PROTEINUR NEGATIVE 02/26/2016 2039   PROTEINUR 1+* 12/22/2015 1614   UROBILINOGEN 0.2 07/08/2015 1050   NITRITE NEGATIVE 02/26/2016 2039   NITRITE Negative 12/22/2015 1614   LEUKOCYTESUR NEGATIVE 02/26/2016 2039   LEUKOCYTESUR Negative 12/22/2015 1614   Sepsis Labs: _0 (procalcitonin:4,lacticidven:4) ) Recent Results (from the past 240 hour(s))  TECHNOLOGIST REVIEW     Status: None   Collection Time: 02/21/16  9:31 AM  Result Value Ref Range Status   Technologist Review Metas and Myelocytes present  Final      UA   no evidence of UTI  Lab Results  Component Value Date   HGBA1C 5.4 03/27/2012    Estimated Creatinine Clearance: 78.2 mL/min (by C-G formula based on Cr of 0.96).  BNP (last 3 results) No results for input(s): PROBNP in the last 8760 hours.   ECG REPORT  Independently reviewed Rate:90  Rhythm: Left fascicular block ST&T Change: No acute ischemic changes   QTC 442  There were no vitals filed for this visit.   Cultures: No results found for: SDES, [place], CULT, REPTSTATUS   Radiological Exams on Admission: Dg Chest 2 View  02/26/2016  CLINICAL DATA:  FALL, PAIN Pt c/o back pain diffusely x 1 day, family states multiple falls today, weakness, chemotherapy pt. EXAM: CHEST  2 VIEW COMPARISON:  12/22/2015 FINDINGS: The heart size and mediastinal contours are within normal limits. Both lungs are clear. The visualized skeletal structures are unremarkable. IMPRESSION: No active cardiopulmonary disease. Electronically Signed   By: Skipper Cliche M.D.   On: 02/26/2016 19:07   Dg Lumbar Spine Complete  02/26/2016   CLINICAL DATA:  Diffuse back pain for 1 day. Multiple falls today. Multiple myeloma. EXAM: LUMBAR SPINE - COMPLETE 4+ VIEW COMPARISON:  The abdomen radiographs dated 11/08/2015 and abdomen and pelvis CT dated 11/07/2015. FINDINGS: Transitional thoracolumbar vertebra followed by 5 non-rib-bearing lumbar vertebrae. Stable mild to moderate levoconvex rotary scoliosis. Mild to moderate anterior and posterior spur formation at multiple levels. Facet degenerative changes in the mid and lower lumbar spine. An approximately 20% T12 superior endplate compression deformity with sclerosis is unchanged. No bony retropulsion. No acute fracture or subluxation. No pars defects. Mild atheromatous arterial calcifications. IMPRESSION: 1. No acute fracture or subluxation. 2. Stable old T12 compression deformity. 3. Multilevel degenerative changes. Electronically Signed   By: Claudie Revering M.D.   On: 02/26/2016 19:07    Chart has been reviewed    Assessment/Plan  65 y.o. male with medical history significant of HTN.  peptic ulcer disease chronic anemia, multiple myeloma sp Autologous transplant here with dehydration and debility low-grade fevers and back pain   Present on Admission:  . ANEMIA - chronic currently appears to be Pacerone continue to follow  . Multiple myeloma [facility]) - oncology aware of admission if patient remains here . Debility - PT OT evaluation prior to discharge patient does not wish to be placed but wife states she cannot support his weight. He would benefit from at least home health PT OT set up  . Dehydration - administer IV fluids and recheck orthostatics  . Fever low-grade but given  slightly elevated lactic acid will await results of blood culture repeat lactic acid. We'll hold off on antibiotics at this point is no clear source of infection or evidence of sepsis  . Back pain- given history of multiple myeloma and low-grade fever will obtain MRI of lumbar spine tomorrow  Other plan as per  orders.  DVT prophylaxis:    SCDs until imaging is done  Code Status:  FULL CODE   as per patient   Family Communication:   Family  at  Bedside  plan of care was discussed with Wife  [name] Hardebeck [phone]  Disposition Plan:     To home once workup is complete and patient is stable if PT OT evaluation clears for home  Consults called: Oncology aware of patient being admitted    Admission status:   obs    Level of care        medical floor            [place] 02/26/2016, 11:27 PM    [facility]  Pager [phone]   after 2 AM please page floor coverage PA If 7AM-7PM, please contact the day team taking care of the patient  [email or web address]  Password [email or web address]

## 2016-02-26 NOTE — ED Notes (Signed)
Bed: [id number] Expected date:  Expected time:  Means of arrival:  Comments: EMS- gen weakness, multiple falls

## 2016-02-26 NOTE — ED Notes (Signed)
Informed Dr. Gilford Raid of lactic acid of 2.19 by QA @ [phone]

## 2016-02-26 NOTE — ED Notes (Signed)
Per EMS, pt from home.  Pt has fallen twice today.  Pt has been weaker following chemo tx.  Pt c/o back pain.  No loc.  No head injury.  Pt is alert and oriented.  Vitals:  132 bp palp, hr 100, resp 18, 98.7 oral, cbg 116

## 2016-02-26 NOTE — ED Provider Notes (Signed)
CSN: [id number]     Arrival date & time 02/26/16  1809 History   First MD Initiated Contact with Patient 02/26/16 1810     Chief Complaint  Patient presents with  . Fall  . Back Pain  PT RELEASED FROM THE [facility] BONE MARROW TRANSPORT FLOOR 1 WEEK AGO.  HE RECEIVED A STEM CELL TRANSPLANT ON 02/04/16.  HIS CELL COUNTS HAVE RECOVERED.  HE IS FOLLOWED BY DR. ENDO AT [facility].  HERE, HE IS FOLLOWED BY DR. MAGRINAT.  PT SAW DR. MAGRINAT ON 6/5.  PT C/O WEAKNESS THEN.  HE CONTINUES TO HAVE A LOT OF WEAKNESS.  HE HAS FALLEN TWICE TODAY.  HE DID NOT HIT HIS HEAD.  NO LOC.  HE WAS UNABLE TO GET UP AFTER THE 2ND FALL.  HE ALSO C/O A LOT OF LOWER BACK PAIN.  THE PT HAS HAD LOW GRADE FEVERS AT HOME, BUT NOTHING OVER 100.     (Consider location/radiation/quality/duration/timing/severity/associated sxs/prior Treatment) The history is provided by the patient and the spouse.    Past Medical History  Diagnosis Date  . Hypertension   . Hyperlipidemia   . Duodenal ulcer   . Allergy   . Thyroid disease   . GERD (gastroesophageal reflux disease)   . Depression   . Anemia   . Hypothyroidism   . Asthma     no treatment x 20 years  . Arthritis   . Double vision     occurs at times   . Multiple myeloma 07/04/2011   Past Surgical History  Procedure Laterality Date  . Cardiolite study  11/25/2003    NORMAL  . Bone marrow transplant  2011    for MM  . Tonsillectomy     Family History  Problem Relation Age of Onset  . Throat cancer Mother   . Hypertension Father   . Stroke Father   . Asthma Father   . Diabetes Father   . Pancreatic cancer Brother    Social History  Substance Use Topics  . Smoking status: Former Smoker -- 3 years    Quit date: 03/27/1969  . Smokeless tobacco: Never Used  . Alcohol Use: No    Review of Systems  Musculoskeletal: Positive for back pain.  Neurological: Positive for weakness.  All other systems reviewed and are negative.     Allergies  Atorvastatin;  Rosuvastatin; Crestor; Lipitor; and Septra  Home Medications   Prior to Admission medications   Medication Sig Start Date End Date Taking? Authorizing Provider  albuterol (PROVENTIL HFA;VENTOLIN HFA) 108 (90 Base) MCG/ACT inhaler Inhale 2 puffs into the lungs every 4 (four) hours as needed for wheezing or shortness of breath. 01/06/16  Yes Evie Lacks [name], MD  aspirin 325 MG tablet Take 325 mg by mouth daily.   Yes Historical Provider, MD  Bisacodyl (DULCOLAX PO) Take 1 tablet by mouth daily as needed (for constipation). Reported on 12/07/2015   Yes Historical Provider, MD  cetirizine (ZYRTEC) 10 MG tablet Take 10 mg by mouth daily.   Yes Historical Provider, MD  docusate sodium (COLACE) 100 MG capsule Take 100 mg by mouth daily as needed for mild constipation or moderate constipation.   Yes Historical Provider, MD  escitalopram (LEXAPRO) 10 MG tablet Take 1 tablet (10 mg total) by mouth daily. 01/06/16  Yes Cassandria Anger, MD  levothyroxine (SYNTHROID, LEVOTHROID) 150 MCG tablet Take 1 tablet by mouth  daily Patient taking differently: Take 150 mcg by mouth daily 07/08/15  Yes Aleksei V [name],  MD  LORazepam (ATIVAN) 1 MG tablet Take 0.5-1 mg by mouth every 6 (six) hours as needed for anxiety (nausea).  02/07/16  Yes Historical Provider, MD  ondansetron (ZOFRAN-ODT) 8 MG disintegrating tablet Take 8 mg by mouth every 8 (eight) hours as needed for nausea or vomiting.  02/02/16 03/03/16 Yes Historical Provider, MD  oxyCODONE (OXY IR/ROXICODONE) 5 MG immediate release tablet Take 5 mg by mouth every 6 (six) hours as needed for moderate pain or severe pain.  02/02/16  Yes Historical Provider, MD  pantoprazole (PROTONIX) 40 MG tablet Take 1 tablet (40 mg total) by mouth 2 (two) times daily before a meal. Patient taking differently: Take 40 mg by mouth daily as needed (reflux).  12/26/15  Yes Modena Jansky, MD  polyethylene glycol (MIRALAX / GLYCOLAX) packet Take 17 g by mouth daily as needed for  mild constipation or moderate constipation.    Yes Historical Provider, MD  prochlorperazine (COMPAZINE) 10 MG tablet Take 10 mg by mouth every 6 (six) hours as needed for nausea or vomiting.  02/02/16  Yes Historical Provider, MD  ranitidine (ZANTAC) 150 MG tablet Take 150 mg by mouth 2 (two) times daily. 02/03/16  Yes Historical Provider, MD  valACYclovir (VALTREX) 1000 MG tablet Take 1 tablet (1,000 mg total) by mouth daily. Patient taking differently: Take 500 mg by mouth daily.  10/22/15  Yes Chauncey Cruel, MD  zolendronic acid (ZOMETA) 4 MG/5ML injection Inject 4 mg into the vein every 3 (three) months.   Yes Historical Provider, MD  zolpidem (AMBIEN) 5 MG tablet Take 1-2 tablets (5-10 mg total) by mouth at bedtime as needed for sleep. 01/06/16  Yes Cassandria Anger, MD  cholecalciferol (VITAMIN D) 1000 UNITS tablet Take 1 tablet (1,000 Units total) by mouth daily. Patient not taking: Reported on 02/26/2016 03/09/15   Evie Lacks [name], MD   BP 155/86 mmHg  Pulse 101  Temp(Src) 98.8 F (37.1 C) (Oral)  Resp 117  Ht 5' 9"  (1.753 m)  Wt 182 lb 14.4 oz (82.963 kg)  BMI 27.00 kg/m2  SpO2 98% Physical Exam  Constitutional: He is oriented to person, place, and time. He appears well-developed and well-nourished.  HENT:  Head: Normocephalic and atraumatic.  Right Ear: External ear normal.  Left Ear: External ear normal.  Nose: Nose normal.  Mouth/Throat: Mucous membranes are dry.  Eyes: Conjunctivae and EOM are normal. Pupils are equal, round, and reactive to light.  Neck: Normal range of motion. Neck supple.  Cardiovascular: Regular rhythm, normal heart sounds and intact distal pulses.  Tachycardia present.   Pulmonary/Chest: Effort normal and breath sounds normal.  Abdominal: Soft. Bowel sounds are normal.  Musculoskeletal: Normal range of motion. He exhibits edema.       Lumbar back: He exhibits tenderness.  Neurological: He is alert and oriented to person, place, and time.   Skin: Skin is warm and dry.  Psychiatric: He has a normal mood and affect. His behavior is normal. Judgment and thought content normal.  Nursing note and vitals reviewed.   ED Course  Procedures (including critical care time) Labs Review Labs Reviewed  CBC WITH DIFFERENTIAL/PLATELET - Abnormal; Notable for the following:    RBC 3.55 (*)    Hemoglobin 10.7 (*)    HCT 31.4 (*)    RDW 18.1 (*)    All other components within normal limits  URINALYSIS, ROUTINE W REFLEX MICROSCOPIC (NOT AT [facility]) - Abnormal; Notable for the following:    Hgb urine dipstick  SMALL (*)    All other components within normal limits  TSH - Abnormal; Notable for the following:    TSH 6.500 (*)    All other components within normal limits  URINE MICROSCOPIC-ADD ON - Abnormal; Notable for the following:    Squamous Epithelial / LPF 0-5 (*)    Bacteria, UA RARE (*)    Casts HYALINE CASTS (*)    All other components within normal limits  I-STAT CG4 LACTIC ACID, ED - Abnormal; Notable for the following:    Lactic Acid, Venous 2.19 (*)    All other components within normal limits  URINE CULTURE  CULTURE, BLOOD (ROUTINE X 2)  CULTURE, BLOOD (ROUTINE X 2)  COMPREHENSIVE METABOLIC PANEL  TROPONIN I  LACTIC ACID, PLASMA  T4, FREE  T3, FREE  MAGNESIUM  PHOSPHORUS  COMPREHENSIVE METABOLIC PANEL  CBC    Imaging Review Dg Chest 2 View  02/26/2016  CLINICAL DATA:  FALL, PAIN Pt c/o back pain diffusely x 1 day, family states multiple falls today, weakness, chemotherapy pt. EXAM: CHEST  2 VIEW COMPARISON:  12/22/2015 FINDINGS: The heart size and mediastinal contours are within normal limits. Both lungs are clear. The visualized skeletal structures are unremarkable. IMPRESSION: No active cardiopulmonary disease. Electronically Signed   By: Skipper Cliche M.D.   On: 02/26/2016 19:07   Dg Lumbar Spine Complete  02/26/2016  CLINICAL DATA:  Diffuse back pain for 1 day. Multiple falls today. Multiple myeloma. EXAM:  LUMBAR SPINE - COMPLETE 4+ VIEW COMPARISON:  The abdomen radiographs dated 11/08/2015 and abdomen and pelvis CT dated 11/07/2015. FINDINGS: Transitional thoracolumbar vertebra followed by 5 non-rib-bearing lumbar vertebrae. Stable mild to moderate levoconvex rotary scoliosis. Mild to moderate anterior and posterior spur formation at multiple levels. Facet degenerative changes in the mid and lower lumbar spine. An approximately 20% T12 superior endplate compression deformity with sclerosis is unchanged. No bony retropulsion. No acute fracture or subluxation. No pars defects. Mild atheromatous arterial calcifications. IMPRESSION: 1. No acute fracture or subluxation. 2. Stable old T12 compression deformity. 3. Multilevel degenerative changes. Electronically Signed   By: Claudie Revering M.D.   On: 02/26/2016 19:07   I have personally reviewed and evaluated these images and lab results as part of my medical decision-making.   EKG Interpretation   Date/Time:  Saturday February 26 2016 19:04:35 EDT Ventricular Rate:  90 PR Interval:  155 QRS Duration: 89 QT Interval:  361 QTC Calculation: 442 R Axis:   -54 Text Interpretation:  Sinus rhythm Probable left atrial enlargement Left  anterior fascicular block Abnormal R-wave progression, late transition  Borderline T abnormalities, anterior leads Confirmed by Samwise Eckardt MD, Yamari Ventola  ([id number]) on 02/26/2016 7:09:56 PM      MDM   Final diagnoses:  Physical deconditioning  Multiple myeloma, remission status unspecified ([place])  H/O stem cell transplant ([place])  Bilateral low back pain without sciatica  Dehydration       Isla Pence, MD 02/27/16 [phone]

## 2016-02-26 NOTE — ED Notes (Signed)
Unable to collect labs patient is in xray 

## 2016-02-27 ENCOUNTER — Observation Stay (HOSPITAL_COMMUNITY): Payer: Commercial Managed Care - PPO

## 2016-02-27 DIAGNOSIS — E860 Dehydration: Secondary | ICD-10-CM | POA: Diagnosis not present

## 2016-02-27 DIAGNOSIS — M545 Low back pain: Secondary | ICD-10-CM | POA: Diagnosis not present

## 2016-02-27 DIAGNOSIS — R21 Rash and other nonspecific skin eruption: Secondary | ICD-10-CM

## 2016-02-27 DIAGNOSIS — C9000 Multiple myeloma not having achieved remission: Secondary | ICD-10-CM | POA: Diagnosis not present

## 2016-02-27 DIAGNOSIS — D649 Anemia, unspecified: Secondary | ICD-10-CM | POA: Diagnosis not present

## 2016-02-27 LAB — CBC WITH DIFFERENTIAL/PLATELET
BASOS ABS: 0 10*3/uL (ref 0.0–0.1)
Basophils Relative: 1 %
EOS ABS: 0 10*3/uL (ref 0.0–0.7)
Eosinophils Relative: 1 %
HEMATOCRIT: 29.6 % — AB (ref 39.0–52.0)
Hemoglobin: 10.2 g/dL — ABNORMAL LOW (ref 13.0–17.0)
LYMPHS ABS: 0.6 10*3/uL — AB (ref 0.7–4.0)
Lymphocytes Relative: 14 %
MCH: 30.2 pg (ref 26.0–34.0)
MCHC: 34.5 g/dL (ref 30.0–36.0)
MCV: 87.6 fL (ref 78.0–100.0)
MONO ABS: 1.3 10*3/uL — AB (ref 0.1–1.0)
Monocytes Relative: 32 %
NEUTROS PCT: 52 %
Neutro Abs: 2.2 10*3/uL (ref 1.7–7.7)
PLATELETS: 142 10*3/uL — AB (ref 150–400)
RBC: 3.38 MIL/uL — AB (ref 4.22–5.81)
RDW: 17.9 % — AB (ref 11.5–15.5)
WBC MORPHOLOGY: INCREASED
WBC: 4.1 10*3/uL (ref 4.0–10.5)

## 2016-02-27 LAB — COMPREHENSIVE METABOLIC PANEL
ALBUMIN: 3.2 g/dL — AB (ref 3.5–5.0)
ALK PHOS: 56 U/L (ref 38–126)
ALT: 19 U/L (ref 17–63)
AST: 29 U/L (ref 15–41)
Anion gap: 6 (ref 5–15)
BILIRUBIN TOTAL: 0.5 mg/dL (ref 0.3–1.2)
BUN: 11 mg/dL (ref 6–20)
CALCIUM: 8.6 mg/dL — AB (ref 8.9–10.3)
CO2: 25 mmol/L (ref 22–32)
CREATININE: 0.89 mg/dL (ref 0.61–1.24)
Chloride: 105 mmol/L (ref 101–111)
GFR calc Af Amer: >60 mL/min (ref >60)
GFR calc non Af Amer: >60 mL/min (ref >60)
GLUCOSE: 90 mg/dL (ref 65–99)
Potassium: 3.7 mmol/L (ref 3.5–5.1)
SODIUM: 136 mmol/L (ref 135–145)
TOTAL PROTEIN: 6 g/dL — AB (ref 6.5–8.1)

## 2016-02-27 LAB — MAGNESIUM: Magnesium: 1.7 mg/dL (ref 1.7–2.4)

## 2016-02-27 LAB — CBC
HCT: 31.2 % — ABNORMAL LOW (ref 39.0–52.0)
Hemoglobin: 10.5 g/dL — ABNORMAL LOW (ref 13.0–17.0)
MCH: 30.3 pg (ref 26.0–34.0)
MCHC: 33.7 g/dL (ref 30.0–36.0)
MCV: 90.2 fL (ref 78.0–100.0)
PLATELETS: 157 10*3/uL (ref 150–400)
RBC: 3.46 MIL/uL — ABNORMAL LOW (ref 4.22–5.81)
RDW: 18.4 % — AB (ref 11.5–15.5)
WBC: 3.8 10*3/uL — ABNORMAL LOW (ref 4.0–10.5)

## 2016-02-27 LAB — PHOSPHORUS: Phosphorus: 2.7 mg/dL (ref 2.5–4.6)

## 2016-02-27 LAB — PROTIME-INR
INR: 1.07 (ref 0.00–1.49)
Prothrombin Time: 14.1 seconds (ref 11.6–15.2)

## 2016-02-27 LAB — T4, FREE: FREE T4: 0.88 ng/dL (ref 0.61–1.12)

## 2016-02-27 LAB — APTT: aPTT: 34 seconds (ref 24–37)

## 2016-02-27 LAB — LACTIC ACID, PLASMA: Lactic Acid, Venous: 1.1 mmol/L (ref 0.5–2.0)

## 2016-02-27 MED ORDER — ENSURE ENLIVE PO LIQD
237.0000 mL | Freq: Two times a day (BID) | ORAL | Status: DC
  Administered 2016-02-27 – 2016-03-01 (×7): 237 mL via ORAL

## 2016-02-27 MED ORDER — FAMOTIDINE IN NACL 20-0.9 MG/50ML-% IV SOLN: 20.0000 mg | Freq: Two times a day (BID) | INTRAVENOUS | Status: DC

## 2016-02-27 MED ORDER — VANCOMYCIN HCL IN DEXTROSE 1-5 GM/200ML-% IV SOLN
1000.0000 mg | Freq: Two times a day (BID) | INTRAVENOUS | Status: DC
  Administered 2016-02-28 – 2016-02-29 (×3): 1000 mg via INTRAVENOUS
  Filled 2016-02-27 (×2): qty 200

## 2016-02-27 MED ORDER — FAMOTIDINE IN NACL 20-0.9 MG/50ML-% IV SOLN
20.0000 mg | Freq: Two times a day (BID) | INTRAVENOUS | Status: DC
  Administered 2016-02-27 – 2016-02-28 (×2): 20 mg via INTRAVENOUS
  Filled 2016-02-27 (×2): qty 50

## 2016-02-27 MED ORDER — CYCLOBENZAPRINE HCL 5 MG PO TABS
5.0000 mg | ORAL_TABLET | Freq: Three times a day (TID) | ORAL | Status: DC
  Administered 2016-02-27 – 2016-03-01 (×10): 5 mg via ORAL
  Filled 2016-02-27 (×10): qty 1

## 2016-02-27 MED ORDER — GADOBENATE DIMEGLUMINE 529 MG/ML IV SOLN
17.0000 mL | Freq: Once | INTRAVENOUS | Status: AC | PRN
  Administered 2016-02-27: 17 mL via INTRAVENOUS

## 2016-02-27 MED ORDER — DIPHENHYDRAMINE HCL 25 MG PO CAPS
25.0000 mg | ORAL_CAPSULE | Freq: Four times a day (QID) | ORAL | Status: DC | PRN
  Administered 2016-02-27: 25 mg via ORAL
  Filled 2016-02-27: qty 1

## 2016-02-27 MED ORDER — DEXTROSE 5 % IV SOLN
2.0000 g | Freq: Three times a day (TID) | INTRAVENOUS | Status: DC
  Administered 2016-02-27 – 2016-02-29 (×6): 2 g via INTRAVENOUS
  Filled 2016-02-27 (×7): qty 2

## 2016-02-27 MED ORDER — ALBUTEROL SULFATE (2.5 MG/3ML) 0.083% IN NEBU: 2.5000 mg | INHALATION_SOLUTION | Freq: Four times a day (QID) | RESPIRATORY_TRACT | Status: DC | PRN

## 2016-02-27 MED ORDER — VANCOMYCIN HCL IN DEXTROSE 1-5 GM/200ML-% IV SOLN
1000.0000 mg | INTRAVENOUS | Status: AC
  Administered 2016-02-27: 1000 mg via INTRAVENOUS
  Filled 2016-02-27: qty 200

## 2016-02-27 NOTE — Progress Notes (Signed)
Patient arrived on the unit at approximately 2346 accompanied by spouse. He is alert and verbally responsive and in no obvious distress. Noted weakness on standing. Made comfortable.

## 2016-02-27 NOTE — Progress Notes (Signed)
MD notified of patients rash on chest, neck, face, shoulders and upper back. Temperature at 98.4 Axillary. Will continue to monitor.

## 2016-02-27 NOTE — Evaluation (Signed)
Physical Therapy Evaluation Patient Details Name: Andre [name] MRN: [id number] DOB: 17-Jan-1951 Today's Date: 02/27/2016   History of Present Illness  65 yo male admitted with anemia, multiple falls, back pain. Hx of DM, OA, back surgery, T12 comp fx, HTN, multiple myeloma-s/p stem cell transplant 02/04/16.   Clinical Impression  On eval, pt required Min assist for mobility-walked ~100 feet with RW. Pain rated at least 6/10 with activity. Pt is unsteady and remains at risk for falls. Noted pt to have some memory issues. Verbal and tactile cueing required during session. Discussed d/c plan-pt states he would prefer to return home. He does report wife is concerned and doesn't feel she can manage safely with him. He also states he would like to have discussion with oncologist. Feel he could benefit from short stay at SNF for continued rehab.     Follow Up Recommendations SNF( if pt will agree);Supervision/Assistance - 24 hour. (If he declines SNF recommend HHPT follow up)    Equipment Recommendations  Rolling walker with 5" wheels (if pt doesn't already have)    Recommendations for Other Services       Precautions / Restrictions Precautions Precautions: Fall Restrictions Weight Bearing Restrictions: No      Mobility  Bed Mobility Overal bed mobility: Needs Assistance Bed Mobility: Supine to Sit;Sit to Sidelying     Supine to sit: Min assist;HOB elevated   Sit to sidelying: Min assist General bed mobility comments: Assist for trunk and bil LEs. Increased time. VCs for technique.   Transfers Overall transfer level: Needs assistance Equipment used: Rolling walker (2 wheeled) Transfers: Sit to/from Stand Sit to Stand: From elevated surface;Min assist         General transfer comment: Assist to rise, stabilize, control descent. VCs safety, technique, hand placement.   Ambulation/Gait Ambulation/Gait assistance: Min assist Ambulation Distance (Feet): 100 Feet Assistive  device: Rolling walker (2 wheeled) Gait Pattern/deviations: Trunk flexed;Step-through pattern;Decreased stride length     General Gait Details: assist to stabilize pt and maneuver safely with RW.   Stairs            Wheelchair Mobility    Modified Rankin (Stroke Patients Only)       Balance Overall balance assessment: Needs assistance;History of Falls         Standing balance support: Bilateral upper extremity supported;During functional activity Standing balance-Leahy Scale: Poor Standing balance comment: requiring RW                             Pertinent Vitals/Pain Pain Assessment: 0-10 Pain Score: 6  Pain Location: back , R LE Pain Descriptors / Indicators: Aching;Sharp;Sore Pain Intervention(s): Monitored during session;Repositioned    Home Living Family/patient expects to be discharged to:: Private residence Living Arrangements: Spouse/significant other Available Help at Discharge: Family Type of Home: House Home Access: Stairs to enter Entrance Stairs-Rails: Right Entrance Stairs-Number of Steps: 4 Home Layout: Able to live on main level with bedroom/bathroom Home Equipment: Cane - single point;Walker - 2 wheels;Shower seat - built in      Prior Function Level of Independence: Independent with assistive device(s)         Comments: uses cane sometimes     Hand Dominance        Extremity/Trunk Assessment   Upper Extremity Assessment:  (noted one instance of "overshooting" when pt was reaching out to touch/grab)           Lower Extremity Assessment:  Generalized weakness;RLE deficits/detail RLE Deficits / Details: hip flex 3-/5-possibly limited by pain.     Cervical / Trunk Assessment: Normal  Communication   Communication: No difficulties  Cognition Arousal/Alertness: Awake/alert Behavior During Therapy: WFL for tasks assessed/performed Overall Cognitive Status: No family/caregiver present to determine baseline cognitive  functioning Area of Impairment: Memory;Problem solving     Memory: Decreased short-term memory       Problem Solving: Requires tactile cues;Requires verbal cues General Comments: pt had difficulty recalling DME available at home.     General Comments      Exercises        Assessment/Plan    PT Assessment Patient needs continued PT services  PT Diagnosis Difficulty walking;Abnormality of gait;Generalized weakness;Acute pain   PT Problem List Decreased strength;Decreased activity tolerance;Decreased balance;Decreased mobility;Decreased knowledge of use of DME;Decreased cognition;Pain  PT Treatment Interventions DME instruction;Gait training;Functional mobility training;Therapeutic activities;Patient/family education;Balance training;Therapeutic exercise   PT Goals (Current goals can be found in the Care Plan section) Acute Rehab PT Goals Patient Stated Goal: home PT Goal Formulation: With patient Time For Goal Achievement: 03/12/16 Potential to Achieve Goals: Good    Frequency Min 3X/week   Barriers to discharge        Co-evaluation               End of Session Equipment Utilized During Treatment: Gait belt Activity Tolerance: Patient limited by pain Patient left: in bed;with call bell/phone within reach (bed alarm unable to be set so placed all 4 rails up)      Functional Assessment Tool Used: clinical judgement Functional Limitation: Mobility: Walking and moving around Mobility: Walking and Moving Around Current Status (N4076): At least 20 percent but less than 40 percent impaired, limited or restricted Mobility: Walking and Moving Around Goal Status [phone]): At least 1 percent but less than 20 percent impaired, limited or restricted    Time: 1031-5945 PT Time Calculation (min) (ACUTE ONLY): 27 min   Charges:   PT Evaluation $PT Eval Low Complexity: 1 Procedure PT Treatments $Gait Training: 8-22 mins   PT G Codes:   PT G-Codes **NOT FOR INPATIENT  CLASS** Functional Assessment Tool Used: clinical judgement Functional Limitation: Mobility: Walking and moving around Mobility: Walking and Moving Around Current Status (O5929): At least 20 percent but less than 40 percent impaired, limited or restricted Mobility: Walking and Moving Around Goal Status [phone]): At least 1 percent but less than 20 percent impaired, limited or restricted    Weston Anna, MPT Pager: [phone]

## 2016-02-27 NOTE — Progress Notes (Signed)
MD notified of patients temp of 100.1. Will continue to monitor.

## 2016-02-27 NOTE — Progress Notes (Signed)
[facility] Progress Note  Patient: Andre [name] YFV:[id number]   PCP: [name], MD DOB: 11-13-1950   DOA: 02/26/2016   DOS: 02/27/2016   Date of Service: the patient was seen and examined on 02/27/2016  Subjective: The patient in the morning complaining about mild pain on the right flank which has been present since his discharge from [facility]. Denies having any complaints of dizziness or lightheadedness no chest pain or shortness of breath and nausea no vomiting. He mention he felt sleepy. Later in the evening the patient developed a rash on his chest around tape that was present for his access site also had fever. Nutrition: Tolerating oral diet  Brief hospital course: Patient was admitted on 02/26/2016, with complaint of fall and back pain, was found to have fever with low WBC. Currently further plan is continue IV antibiotics.  Assessment and Plan: 1. Multiple myeloma ([place]) Initial autologous transplant in 2012, recent autologous stem cell rescue 02/04/2016 granix was given for WBC recovery. Treated for febrile neutropenia as well as diarrhea with negative workup and discharged on 02/18/2016. Patient has been complaining of back pain ever since and presented with a fall and generalized weakness. At present due to decreasing WBC as well as low-grade temperature the patient will be started on cefepime and vancomycin to cover him for possible infection. Patient's primary oncologist will follow-up in the morning, at present I discussed the case with Dr. Burr Medico.  2. Papular rash on the chest. Blanchable non-itching. Located around the tape. Possibility of allergic reaction. We will use Benadryl and Pepcid.  3. Acute encephalopathy. Patient has mild confusion at present, at his baseline the patient has some confusion as per wife but it is more profound. Possibility is due to pain medications as well as Flexeril. We will continue to closely monitor the patient at  present.  4. Hypothyroidism. Check TSH free T4 in the morning.  5. Back pain. Patient's primary reason for admission. Examination does not reveal any neurological component. MRI spine was also unremarkable. We will continue to closely monitor.  Pain management: OxyIR from Percocet due to Tylenol component. Continue when necessary Tylenol, continue when necessary Norco. Added schedule Flexeril Activity: consulted physical therapy Bowel regimen: last BM prior to admission Diet: Regular diet DVT Prophylaxis: subcutaneous Heparin  Advance goals of care discussion: Full code  Family Communication: family was present at bedside, at the time of interview. The pt provided permission to discuss medical plan with the family. Opportunity was given to ask question and all questions were answered satisfactorily.   Disposition:  Discharge to home, with home health Expected discharge date: 02/29/2016  Consultants: Medical oncology Procedures: None  Antibiotics: Anti-infectives    Start     Dose/Rate Route Frequency Ordered Stop   02/28/16 0800  vancomycin (VANCOCIN) IVPB 1000 mg/200 mL premix     1,000 mg 200 mL/hr over 60 Minutes Intravenous Every 12 hours 02/27/16 1856     02/27/16 1900  ceFEPIme (MAXIPIME) 2 g in dextrose 5 % 50 mL IVPB     2 g 100 mL/hr over 30 Minutes Intravenous Every 8 hours 02/27/16 1826     02/27/16 1900  vancomycin (VANCOCIN) IVPB 1000 mg/200 mL premix     1,000 mg 200 mL/hr over 60 Minutes Intravenous STAT 02/27/16 1856 02/28/16 1900   02/27/16 1000  valACYclovir (VALTREX) tablet 500 mg     500 mg Oral Daily 02/26/16 2354          Intake/Output Summary (Last  24 hours) at 02/27/16 1912 Last data filed at 02/27/16 1300  Gross per 24 hour  Intake  687.1 ml  Output    725 ml  Net  -37.9 ml   Filed Weights   02/27/16 0012  Weight: 82.963 kg (182 lb 14.4 oz)    Objective: Physical Exam: Filed Vitals:   02/27/16 1320 02/27/16 1611 02/27/16 1745  02/27/16 1900  BP: 137/85   147/96  Pulse: 98   91  Temp: 98.8 F (37.1 C) 100.1 F (37.8 C) 98.4 F (36.9 C) 99.1 F (37.3 C)  TempSrc: Oral Oral Axillary Oral  Resp: 16   20  Height:      Weight:      SpO2: 96%   98%    General: Alert, Awake and Oriented to Time, Place and Person. Appear in mild distress Eyes: PERRL, Conjunctiva normal ENT: Oral Mucosa clear moist. Neck: no JVD, no Abnormal Mass Or lumps Cardiovascular: S1 and S2 Present, no Murmur, Peripheral Pulses Present Respiratory: Bilateral Air entry equal and Decreased, Clear to Auscultation, no Crackles, no wheezes Abdomen: Bowel Sound present, Soft and no tenderness Skin: no redness, maculopapular Rash in the right chest Extremities: no Pedal edema, no calf tenderness Neurologic: Grossly no focal neuro deficit. Bilaterally Equal motor strength  Data Reviewed: CBC:  Recent Labs Lab 02/21/16 0931 02/26/16 1923 02/27/16 0355  WBC 6.2 4.9 3.8*  NEUTROABS 3.4 2.9  --   HGB 9.8* 10.7* 10.5*  HCT 30.2* 31.4* 31.2*  MCV 90.1 88.5 90.2  PLT 116* 174 888   Basic Metabolic Panel:  Recent Labs Lab 02/21/16 0931 02/26/16 1923 02/27/16 0355  NA 141 136 136  K 4.4 3.8 3.7  CL  --  102 105  CO2 _0 GLUCOSE 75 98 90  BUN 7._1 CREATININE 0.9 0.96 0.89  CALCIUM 9.4 9.1 8.6*  MG 2.0  --  1.7  PHOS  --   --  2.7    Liver Function Tests:  Recent Labs Lab 02/21/16 0931 02/26/16 1923 02/27/16 0355  AST 24 34 29  ALT _2 ALKPHOS 60 61 56  BILITOT <0.30 0.4 0.5  PROT 5.9*  5.2* 6.6 6.0*  ALBUMIN 3.0* 3.5 3.2*   No results for input(s): LIPASE, AMYLASE in the last 168 hours. No results for input(s): AMMONIA in the last 168 hours. Coagulation Profile:  Recent Labs Lab 02/27/16 1840  INR 1.07   Cardiac Enzymes:  Recent Labs Lab 02/26/16 1923  TROPONINI <0.03   BNP (last 3 results) No results for input(s): PROBNP in the last 8760 hours.  CBG: No results for input(s):  GLUCAP in the last 168 hours.  Studies: Mr Lumbar Spine W Wo Contrast  02/27/2016  CLINICAL DATA:  Back pain right worse than left. History of multiple myeloma. Low-grade fever. Fatigue. Recent falls. EXAM: MRI LUMBAR SPINE WITHOUT AND WITH CONTRAST TECHNIQUE: Multiplanar and multiecho pulse sequences of the lumbar spine were obtained without and with intravenous contrast. CONTRAST:  50m MULTIHANCE GADOBENATE DIMEGLUMINE 529 MG/ML IV SOLN COMPARISON:  Radiography 02/26/2016. Abdominal CT 11/07/2015. Head CT 04/01/2013. FINDINGS: Segmentation:  5 lumbar type vertebral bodies. Alignment:  Curvature convex to the left with the apex at L2-3. Vertebrae: Old superior endplate fracture anteriorly at T12 with loss of height of 20%. Completely healed without residual edema. No evidence of recent fracture. Marrow pattern is somewhat heterogeneous which can be normal. There are no enhancing or destructive marrow space lesions in  the region image to. Conus medullaris: Extends to the T12 level and appears normal. Paraspinal and other soft tissues: No abnormal finding. Disc levels: L1-2: Normal interspace. L2-3: Small central disc herniation indents the thecal sac slightly but does not affect the neural structures. Likely incidental. No stenosis. L3-4:  Mild bulging of the disc.  No stenosis. L4-5: Mild bulging of the disc. Mild facet hypertrophy. No stenosis. Facet arthritis could be associated with low back pain. L5-S1: Minimal bulging of the disc. Mild facet degeneration. No stenosis. Facet arthritis could be associated with mild low back pain. Sacroiliac joints appear unremarkable. IMPRESSION: Old healed minor superior endplate fracture anteriorly at T12. No recent fracture. Marrow pattern is somewhat heterogeneous, but is one of the patterns of normal. There are no enhancing lesions or destructive lesions. No statement can be made about the presence of myeloma cells given this pattern. No stenosis or neural compression.  Mild age related desiccation and bulging of the discs. Mild facet osteoarthritis at L4-5 and L5-S1, which could be associated with low back pain. Electronically Signed   By: [name] Chimes M.D.   On: 02/27/2016 09:48     Scheduled Meds: . aspirin  325 mg Oral Daily  . ceFEPime (MAXIPIME) IV  2 g Intravenous Q8H  . cyclobenzaprine  5 mg Oral TID  . escitalopram  10 mg Oral Daily  . famotidine (PEPCID) IV  20 mg Intravenous Q12H  . feeding supplement (ENSURE ENLIVE)  237 mL Oral BID BM  . levothyroxine  200 mcg Oral QAC breakfast  . loratadine  10 mg Oral Daily  . valACYclovir  500 mg Oral Daily  . vancomycin  1,000 mg Intravenous STAT  . [START ON 02/28/2016] vancomycin  1,000 mg Intravenous Q12H   Continuous Infusions:  PRN Meds: acetaminophen **OR** acetaminophen, albuterol, bisacodyl, diphenhydrAMINE, docusate sodium, HYDROcodone-acetaminophen, LORazepam, ondansetron **OR** ondansetron (ZOFRAN) IV, oxyCODONE, pantoprazole, polyethylene glycol, prochlorperazine, zolpidem  Time spent: 30 minutes  Author: Berle Mull, MD [facility] Pager: [phone] 02/27/2016 7:12 PM  If 7PM-7AM, please contact night-coverage at [email or web address], password [facility]

## 2016-02-27 NOTE — Progress Notes (Signed)
Pharmacy Antibiotic Note  Andre [name] is a 65 y.o. male admitted on 02/26/2016 with generalized weakness.  PMH includes Multiple Myeloma, currently not on chemo, but s/p stem cell transplant in May 2017.  On 6/11 he is febrile to max of 100.1, WBC is decreased, and RN reports new large rash on his chest.  Pharmacy has been consulted for Vancomycin dosing; Cefepime dosing per MD for febrile neutropenia.  Plan:  Cefepime 2g IV q8h  Vancomycin 1g IV q12h.  Measure Vanc trough at steady state.  Follow up renal fxn, culture results, and clinical course.   Height: 5' 9"  (175.3 cm) Weight: 182 lb 14.4 oz (82.963 kg) IBW/kg (Calculated) : 70.7  Temp (24hrs), Avg:98.8 F (37.1 C), Min:97.9 F (36.6 C), Max:100.1 F (37.8 C)   Recent Labs Lab 02/21/16 0931 02/26/16 1923 02/26/16 1931 02/27/16 0027 02/27/16 0355  WBC 6.2 4.9  --   --  3.8*  CREATININE 0.9 0.96  --   --  0.89  LATICACIDVEN  --   --  2.19* 1.1  --     Estimated Creatinine Clearance: 81.6 mL/min (by C-G formula based on Cr of 0.89).    Allergies  Allergen Reactions  . Atorvastatin Other (See Comments)  . Rosuvastatin     Other reaction(s): Liver Disorder  . Crestor [Rosuvastatin Calcium]     ADVERSE EFFECTS ON LIVER  . Lipitor [Atorvastatin Calcium]   . Septra [Sulfamethoxazole-Trimethoprim] Rash    Antimicrobials this admission: 6/11 Cefepime >>  6/11 Vancomycin >>   Dose adjustments this admission:  Microbiology results: 6/10 BCx: sent 6/10 UCx: sent   Thank you for allowing pharmacy to be a part of this patient's care.  Gretta Arab PharmD, BCPS Pager [phone] 02/27/2016 6:45 PM

## 2016-02-28 ENCOUNTER — Other Ambulatory Visit: Payer: Self-pay | Admitting: Oncology

## 2016-02-28 DIAGNOSIS — C9000 Multiple myeloma not having achieved remission: Secondary | ICD-10-CM | POA: Diagnosis not present

## 2016-02-28 DIAGNOSIS — D649 Anemia, unspecified: Secondary | ICD-10-CM

## 2016-02-28 DIAGNOSIS — M545 Low back pain: Secondary | ICD-10-CM | POA: Diagnosis not present

## 2016-02-28 DIAGNOSIS — C9002 Multiple myeloma in relapse: Secondary | ICD-10-CM

## 2016-02-28 DIAGNOSIS — R5383 Other fatigue: Secondary | ICD-10-CM | POA: Diagnosis not present

## 2016-02-28 DIAGNOSIS — R410 Disorientation, unspecified: Secondary | ICD-10-CM | POA: Diagnosis not present

## 2016-02-28 DIAGNOSIS — K274 Chronic or unspecified peptic ulcer, site unspecified, with hemorrhage: Secondary | ICD-10-CM

## 2016-02-28 DIAGNOSIS — R52 Pain, unspecified: Secondary | ICD-10-CM

## 2016-02-28 DIAGNOSIS — E860 Dehydration: Secondary | ICD-10-CM | POA: Diagnosis not present

## 2016-02-28 LAB — COMPREHENSIVE METABOLIC PANEL
ALT: 19 U/L (ref 17–63)
AST: 30 U/L (ref 15–41)
Albumin: 3.1 g/dL — ABNORMAL LOW (ref 3.5–5.0)
Alkaline Phosphatase: 58 U/L (ref 38–126)
Anion gap: 4 — ABNORMAL LOW (ref 5–15)
BUN: 11 mg/dL (ref 6–20)
CALCIUM: 9.1 mg/dL (ref 8.9–10.3)
CHLORIDE: 105 mmol/L (ref 101–111)
CO2: 28 mmol/L (ref 22–32)
CREATININE: 0.74 mg/dL (ref 0.61–1.24)
Glucose, Bld: 100 mg/dL — ABNORMAL HIGH (ref 65–99)
Potassium: 4 mmol/L (ref 3.5–5.1)
Sodium: 137 mmol/L (ref 135–145)
Total Bilirubin: 0.5 mg/dL (ref 0.3–1.2)
Total Protein: 5.8 g/dL — ABNORMAL LOW (ref 6.5–8.1)

## 2016-02-28 LAB — URINE CULTURE

## 2016-02-28 LAB — CBC WITH DIFFERENTIAL/PLATELET
BASOS PCT: 0 %
Basophils Absolute: 0 10*3/uL (ref 0.0–0.1)
EOS PCT: 3 %
Eosinophils Absolute: 0.1 10*3/uL (ref 0.0–0.7)
HCT: 30.7 % — ABNORMAL LOW (ref 39.0–52.0)
Hemoglobin: 10.2 g/dL — ABNORMAL LOW (ref 13.0–17.0)
LYMPHS ABS: 1 10*3/uL (ref 0.7–4.0)
Lymphocytes Relative: 30 %
MCH: 30 pg (ref 26.0–34.0)
MCHC: 33.2 g/dL (ref 30.0–36.0)
MCV: 90.3 fL (ref 78.0–100.0)
MONO ABS: 0.7 10*3/uL (ref 0.1–1.0)
Monocytes Relative: 21 %
NEUTROS ABS: 1.5 10*3/uL — AB (ref 1.7–7.7)
NEUTROS PCT: 46 %
PLATELETS: 140 10*3/uL — AB (ref 150–400)
RBC: 3.4 MIL/uL — ABNORMAL LOW (ref 4.22–5.81)
RDW: 18.3 % — ABNORMAL HIGH (ref 11.5–15.5)
WBC: 3.3 10*3/uL — ABNORMAL LOW (ref 4.0–10.5)

## 2016-02-28 LAB — T3, FREE: T3, Free: 2.1 pg/mL (ref 2.0–4.4)

## 2016-02-28 LAB — MAGNESIUM: MAGNESIUM: 2.2 mg/dL (ref 1.7–2.4)

## 2016-02-28 MED ORDER — POLYETHYLENE GLYCOL 3350 17 G PO PACK
17.0000 g | PACK | Freq: Every day | ORAL | Status: DC
Start: 2016-02-28 — End: 2016-03-01
  Administered 2016-02-28 – 2016-02-29 (×2): 17 g via ORAL
  Filled 2016-02-28 (×3): qty 1

## 2016-02-28 MED ORDER — FAMOTIDINE 20 MG PO TABS
20.0000 mg | ORAL_TABLET | Freq: Two times a day (BID) | ORAL | Status: DC
  Administered 2016-02-28 – 2016-03-01 (×4): 20 mg via ORAL
  Filled 2016-02-28 (×4): qty 1

## 2016-02-28 MED ORDER — HYDROCORTISONE 0.5 % EX CREA
TOPICAL_CREAM | Freq: Two times a day (BID) | CUTANEOUS | Status: DC
  Administered 2016-02-28: 1 via TOPICAL
  Administered 2016-02-28: 15:00:00 via TOPICAL
  Filled 2016-02-28: qty 28.35

## 2016-02-28 NOTE — Progress Notes (Signed)
Physical Therapy Treatment Patient Details Name: Andre [name] MRN: [id number] DOB: 01-12-1951 Today's Date: 02/28/2016    History of Present Illness 65 yo male admitted with anemia, multiple falls, back pain. Hx of DM, OA, back surgery, T12 comp fx, HTN, multiple myeloma-s/p stem cell transplant 02/04/16.     PT Comments    Improved mobility and active Andre [name] LE movements.  Assisted pt OOB to amb a greater distance in hallway.  Still unsteady and limited activity level.  Pt will need ST Rehab at SNF.  Follow Up Recommendations  SNF     Equipment Recommendations       Recommendations for Other Services       Precautions / Restrictions Precautions Precautions: Fall Restrictions Weight Bearing Restrictions: No    Mobility  Bed Mobility Overal bed mobility: Needs Assistance Bed Mobility: Supine to Sit;Sit to Supine     Supine to sit: Supervision;HOB elevated Sit to supine: Min assist   General bed mobility comments: Assist for trunk and bil LEs. Increased time. VCs for technique.   Transfers Overall transfer level: Needs assistance Equipment used: Rolling walker (2 wheeled) Transfers: Sit to/from Stand Sit to Stand: From elevated surface;Min assist         General transfer comment: Assist to rise, stabilize, control descent. VCs safety, technique, hand placement.   Ambulation/Gait Ambulation/Gait assistance: Min guard;Min assist Ambulation Distance (Feet): 185 Feet (x 2 one sitting rest break) Assistive device: Rolling walker (2 wheeled);None Gait Pattern/deviations: Step-through pattern;Decreased stride length Gait velocity: decreased   General Gait Details: stated with walker but appeared "clumbsy" so then + 2 hand held assist side by side with recliner chair following for safety and Revin LE quivered with fatigue.    Stairs            Wheelchair Mobility    Modified Rankin (Stroke Patients Only)       Balance                                     Cognition Arousal/Alertness: Awake/alert Behavior During Therapy: WFL for tasks assessed/performed Overall Cognitive Status: Within Functional Limits for tasks assessed Area of Impairment: Problem solving     Memory: Decreased short-term memory       Problem Solving: Requires tactile cues;Requires verbal cues      Exercises      General Comments        Pertinent Vitals/Pain Pain Assessment: No/denies pain Pain Score: 7  Pain Location: lower back Pain Descriptors / Indicators: Aching;Sore Pain Intervention(s): Limited activity within patient's tolerance;Monitored during session    Andre [name] expects to be discharged to:: Private residence Living Arrangements: Spouse/significant other Available Help at Discharge: Family Type of Home: House Home Access: Stairs to enter Entrance Stairs-Rails: Right Home Layout: Able to live on main level with bedroom/bathroom Home Equipment: [place] - single point;Walker - 2 wheels;Shower seat - built in      Prior Function Level of Independence: Independent with assistive device(s)      Comments: uses cane sometimes   PT Goals (current goals can now be found in the care plan section) Acute Rehab PT Goals Patient Stated Goal: rehab then home Progress towards PT goals: Progressing toward goals    Frequency  Min 3X/week    PT Plan Current plan remains appropriate    Co-evaluation             End  of Session Equipment Utilized During Treatment: Gait belt Activity Tolerance: Patient limited by pain Patient left: in bed;with call bell/phone within reach     Time: 1031-1058 PT Time Calculation (min) (ACUTE ONLY): 27 min  Charges:  $Gait Training: 8-22 mins $Therapeutic Activity: 8-22 mins                    G Codes:      Rica Koyanagi  PTA [facility]  Acute  Rehab Pager      [phone]

## 2016-02-28 NOTE — Progress Notes (Signed)
Occupational Therapy Evaluation Patient Details Name: Andre [name] MRN: [id number] DOB: 1951/01/22 Today's Date: 02/28/2016    History of Present Illness 65 yo male admitted with anemia, multiple falls, back pain. Hx of DM, OA, back surgery, T12 comp fx, HTN, multiple myeloma-s/p stem cell transplant 02/04/16.    Clinical Impression   Patient presents to OT with decreased ADL independence and safety due to the deficits listed below. He will benefit from skilled OT to maximize function and to facilitate a safe discharge. OT will follow.    Follow Up Recommendations  SNF    Equipment Recommendations  3 in 1 bedside comode    Recommendations for Other Services       Precautions / Restrictions Precautions Precautions: Fall Restrictions Weight Bearing Restrictions: No      Mobility Bed Mobility Overal bed mobility: Needs Assistance Bed Mobility: Supine to Sit;Sit to Supine     Supine to sit: Supervision;HOB elevated Sit to supine: Min assist      Transfers Overall transfer level: Needs assistance Equipment used: Rolling walker (2 wheeled) Transfers: Sit to/from Stand Sit to Stand: From elevated surface;Min assist         General transfer comment: Assist to rise, stabilize, control descent. VCs safety, technique, hand placement.     Balance                                            ADL Overall ADL's : Needs assistance/impaired Eating/Feeding: Set up;Sitting;Bed level   Grooming: Wash/dry hands;Wash/dry face;Oral care;Minimal assistance;Sitting   Upper Body Bathing: Minimal assitance;Sitting   Lower Body Bathing: Moderate assistance;Sit to/from stand   Upper Body Dressing : Set up;Sitting                   Functional mobility during ADLs: Minimal assistance;Moderate assistance;Rolling walker General ADL Comments: Patient sat EOB, participated in grooming, bathing, dressing activities sit to stand to RW. Fatigues easily.  Cognitive deficits present.      Vision     Perception     Praxis      Pertinent Vitals/Pain Pain Assessment: 0-10 Pain Score: 7  Pain Location: lower back Pain Descriptors / Indicators: Aching;Sore Pain Intervention(s): Limited activity within patient's tolerance;Monitored during session     Hand Dominance Right   Extremity/Trunk Assessment Upper Extremity Assessment Upper Extremity Assessment: Generalized weakness       Cervical / Trunk Assessment Cervical / Trunk Assessment: Normal   Communication Communication Communication: No difficulties   Cognition Arousal/Alertness: Awake/alert Behavior During Therapy: WFL for tasks assessed/performed Overall Cognitive Status: Impaired/Different from baseline Area of Impairment: Memory;Problem solving     Memory: Decreased short-term memory       Problem Solving: Requires tactile cues;Requires verbal cues     General Comments       Exercises       Shoulder Instructions      Home Living Family/patient expects to be discharged to:: Private residence Living Arrangements: Spouse/significant other Available Help at Discharge: Family Type of Home: House Home Access: Stairs to enter Technical brewer of Steps: 4 Entrance Stairs-Rails: Right Home Layout: Able to live on main level with bedroom/bathroom     Bathroom Shower/Tub: Occupational psychologist: [place] - single point;Walker - 2 wheels;Shower seat - built in  Prior Functioning/Environment Level of Independence: Independent with assistive device(s)        Comments: uses cane sometimes    OT Diagnosis: Generalized weakness;Cognitive deficits   OT Problem List: Decreased strength;Decreased activity tolerance;Impaired balance (sitting and/or standing);Decreased cognition;Decreased safety awareness;Decreased knowledge of use of DME or AE;Decreased knowledge of precautions;Pain   OT  Treatment/Interventions: Self-care/ADL training;DME and/or AE instruction;Therapeutic activities;Patient/family education;Therapeutic exercise    OT Goals(Current goals can be found in the care plan section) Acute Rehab OT Goals Patient Stated Goal: rehab then home OT Goal Formulation: With patient Time For Goal Achievement: 03/13/16 Potential to Achieve Goals: Good ADL Goals Pt Will Perform Grooming: with supervision;standing Pt Will Perform Upper Body Bathing: with set-up;sitting Pt Will Perform Lower Body Bathing: with supervision;sit to/from stand Pt Will Perform Upper Body Dressing: with set-up;sitting Pt Will Perform Lower Body Dressing: with supervision;sit to/from stand Pt Will Transfer to Toilet: with supervision;ambulating;bedside commode Pt Will Perform Toileting - Clothing Manipulation and hygiene: with supervision;sit to/from stand  OT Frequency: Min 2X/week   Barriers to D/C:            Co-evaluation              End of Session Equipment Utilized During Treatment: Surveyor, mining Communication: Other (comment) (wife asking about topical ointment for rash)  Activity Tolerance: Patient tolerated treatment well Patient left: in bed;with call bell/phone within reach;with family/visitor present   Time: 5681-2751 OT Time Calculation (min): 44 min Charges:  OT General Charges $OT Visit: 1 Procedure OT Evaluation $OT Eval Moderate Complexity: 1 Procedure OT Treatments $Self Care/Home Management : 23-37 mins G-Codes: OT G-codes **NOT FOR INPATIENT CLASS** Functional Assessment Tool Used: clinical judgment Functional Limitation: Self care Self Care Current Status (Z0017): At least 40 percent but less than 60 percent impaired, limited or restricted Self Care Goal Status (C9449): At least 1 percent but less than 20 percent impaired, limited or restricted  Andre [name] A 02/28/2016, 12:52 PM

## 2016-02-28 NOTE — Consult Note (Signed)
[place]  [phone] [phone]     ID: TRICE ASPINALL DOB: August 28, 1951  MR#: [id number]  JEH#:[id number]  Patient Care Team: Cassandria Anger, MD as PCP - General (Internal Medicine) Chauncey Cruel, MD as Consulting Physician (Oncology) Concepcion Living, MD as Consulting Physician (Neurology) PCP: [name], MD OTHER MD: [name] MD  CHIEF COMPLAINT: multiple myeloma, s/p autologous transplant  CURRENT TREATMENT: observation   INTERVAL HISTORY: "[name]" is currently day 24 from his second autologous stem cell transplant for relapsed myeloma. He was doing remarkably well when I saw him 06/05, but as of 06/09 he was c/o back pain, nocturia and overall weakness. Per records he fell and couldn't get up--patient has no memory of that. Since admission 02/26/2016 cultures remain negative and lumbar MRI showed no enhancing or destructive lesions. He is being treated with empiric cefepime/vancomycin and d/c to Rehab facility is being considered  REVIEW OF SYSTEMS: [name] is vague about what brought him to the hospital--this is not a new problem. There has been a slow decline in his mentation for the past 2-3 years and he has had episodes of frank confusion, though this AM he is well-oriented. Brain MRI and neurologic evaluation have not provided a definitive diagnosis. Today he says his pain is better and he feels better overall. Denies constipation or diarrhe, dysuria or hematuria. No family in room   HISTORY OF PRESENT ILLNESS: From the original intake nodes:  The patient was worked up for peptic ulcer disease in August of 2011, with significant bleeding and anemia. The patient was Helicobacter pylori negative. He received some epinephrine when he had his EGD and then started on Protonix. [name] anemia slowly resolved so that by September, his hemoglobin was up to 10.9 and by earlier this month, his hemoglobin was up  to 67.5.   As part of his general workup, he was found to have a slightly elevated globulin fraction. In September, his total protein was 8.3 with an albumin of 3.8. In January, the total protein was 8.4 and albumin 3.6. With persistence of this slight abnormality, Dr. Olevia Perches obtained serum immunofixation and SPEP. The SPTP showed an M-spike of 2.67 grams. A total IgG was 4,190. Total IgA low at 28. Total IgM low at 28 also. The immunofixation showed a monoclonal IgG lambda paraprotein. There were also monoclonal free lambda light chains present. With this information, the patient was referred for further evaluation.   A diagnosis of myeloma was confirmed by bone marrow biopsy and subsequebnt treatment is as detailed below  PAST MEDICAL HISTORY: Past Medical History  Diagnosis Date  . Hypertension   . Hyperlipidemia   . Duodenal ulcer   . Allergy   . Thyroid disease   . GERD (gastroesophageal reflux disease)   . Depression   . Anemia   . Hypothyroidism   . Asthma     no treatment x 20 years  . Arthritis   . Double vision     occurs at times   . Multiple myeloma 07/04/2011    PAST SURGICAL HISTORY: Past Surgical History  Procedure Laterality Date  . Cardiolite study  11/25/2003    NORMAL  . Bone marrow transplant  2011    for MM  . Tonsillectomy      FAMILY HISTORY Family History  Problem Relation Age of Onset  . Throat cancer Mother   . Hypertension Father   . Stroke Father   . Asthma Father   .  Diabetes Father   . Pancreatic cancer Brother   [name] father died at the age of 85 following a stroke. [name] mother died with cancer of the throat at the age of 57. She had a history of depression and was a smoker. Not clear how much alcohol she drank, according to the patient. The patient had two brothers. One died from the age of 71 from a "kidney problem." The second one died at the age of 59 from pancreatic cancer. (This may have been duodenal  cancer. 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 His wife of 37+ years was [name], but they are now divorced. In 2016 he remarried, his current wife's name is [name]. The patient has two daughters: [name], who works in [employer].. They both live here in [place]. The patient is very close to his daughters. Renteria first daughter, [name], born 03/06/2013, apparently has Weber/Osler/Rendu. The patient is not a church attender. He does derive a great deal of support from friends at the "[employer]" which he attends very regularly, and also participates in Al-Anon even though actually there is no alcohol or drug problem in the family. He participates in this group and he gets quite a bit of support from it.     HEALTH MAINTENANCE: Social History  Substance Use Topics  . Smoking status: Former Smoker -- 3 years    Quit date: 03/27/1969  . Smokeless tobacco: Never Used  . Alcohol Use: No     Allergies  Allergen Reactions  . Atorvastatin Other (See Comments)  . Rosuvastatin     Other reaction(s): Liver Disorder  . Crestor [Rosuvastatin Calcium]     ADVERSE EFFECTS ON LIVER  . Lipitor [Atorvastatin Calcium]   . Septra [Sulfamethoxazole-Trimethoprim] Rash    Current Facility-Administered Medications  Medication Dose Route Frequency Provider Last Rate Last Dose  . acetaminophen (TYLENOL) tablet 650 mg  650 mg Oral Q6H PRN Toy Baker, MD       Or  . acetaminophen (TYLENOL) suppository 650 mg  650 mg Rectal Q6H PRN Toy Baker, MD      . albuterol (PROVENTIL) (2.5 MG/3ML) 0.083% nebulizer solution 2.5 mg  2.5 mg Nebulization Q6H PRN Toy Baker, MD      . aspirin tablet 325 mg  325 mg Oral Daily Toy Baker, MD   325 mg at 02/28/16 0735  . bisacodyl (DULCOLAX) EC tablet 5 mg  5 mg Oral Daily PRN Toy Baker,  MD      . ceFEPIme (MAXIPIME) 2 g in dextrose 5 % 50 mL IVPB  2 g Intravenous Q8H Lavina Hamman, MD   2 g at 02/28/16 0513  . cyclobenzaprine (FLEXERIL) tablet 5 mg  5 mg Oral TID Lavina Hamman, MD   5 mg at 02/28/16 0735  . diphenhydrAMINE (BENADRYL) capsule 25 mg  25 mg Oral Q6H PRN Lavina Hamman, MD   25 mg at 02/27/16 1907  . docusate sodium (COLACE) capsule 100 mg  100 mg Oral Daily PRN Toy Baker, MD      . escitalopram (LEXAPRO) tablet 10 mg  10 mg Oral Daily Toy Baker, MD   10 mg at 02/27/16 2128  . famotidine (PEPCID) IVPB 20 mg premix  20 mg Intravenous Q12H Lavina Hamman, MD   20 mg at 02/28/16 0735  . feeding supplement (  ENSURE ENLIVE) (ENSURE ENLIVE) liquid 237 mL  237 mL Oral BID BM Anastassia Doutova, MD   237 mL at 02/28/16 0800  . HYDROcodone-acetaminophen (NORCO/VICODIN) 5-325 MG per tablet 1-2 tablet  1-2 tablet Oral Q4H PRN Toy Baker, MD   2 tablet at 02/27/16 2020  . levothyroxine (SYNTHROID, LEVOTHROID) tablet 200 mcg  200 mcg Oral QAC breakfast Toy Baker, MD   200 mcg at 02/28/16 0735  . loratadine (CLARITIN) tablet 10 mg  10 mg Oral Daily Toy Baker, MD   10 mg at 02/28/16 0735  . LORazepam (ATIVAN) tablet 0.5-1 mg  0.5-1 mg Oral Q6H PRN Toy Baker, MD      . ondansetron (ZOFRAN) tablet 4 mg  4 mg Oral Q6H PRN Toy Baker, MD       Or  . ondansetron (ZOFRAN) injection 4 mg  4 mg Intravenous Q6H PRN Toy Baker, MD   4 mg at 02/27/16 1635  . oxyCODONE (Oxy IR/ROXICODONE) immediate release tablet 5-10 mg  5-10 mg Oral Q6H PRN Toy Baker, MD      . pantoprazole (PROTONIX) EC tablet 40 mg  40 mg Oral Daily PRN Toy Baker, MD      . polyethylene glycol (MIRALAX / GLYCOLAX) packet 17 g  17 g Oral Daily PRN Toy Baker, MD      . prochlorperazine (COMPAZINE) tablet 10 mg  10 mg Oral Q6H PRN Toy Baker, MD      . valACYclovir (VALTREX) tablet 500 mg  500 mg Oral Daily Toy Baker, MD   500 mg at 02/27/16 0951  . vancomycin (VANCOCIN) IVPB 1000 mg/200 mL premix  1,000 mg Intravenous Q12H Christine E Shade, RPH   1,000 mg at 02/28/16 0734  . zolpidem (AMBIEN) tablet 5 mg  5 mg Oral QHS PRN Toy Baker, MD   5 mg at 02/27/16 0029    OBJECTIVE: middle aged White man examined in bed Filed Vitals:   02/28/16 0158 02/28/16 0520  BP: 138/77 146/80  Pulse: 75 81  Temp: 97.5 F (36.4 C) 98.6 F (37 C)  Resp: 20 18     Body mass index is 27 kg/(m^2).    Ocular: Sclerae unicteric,EOMs intact Ear-nose-throat: Oropharynx clear Lymphatic: No cervical or supraclavicular adenopathy Lungs no rales or rhonchi, auscultated anterolaterally Heart regular rate and rhythm Abd soft, nontender, positive bowel sounds MSK no joint edema Neuro: non-focal, appropriate affect    LAB RESULTS:  CMP     Component Value Date/Time   NA 137 02/28/2016 0318   NA 141 02/21/2016 0931   K 4.0 02/28/2016 0318   K 4.4 02/21/2016 0931   CL 105 02/28/2016 0318   CL 107 03/10/2013 1352   CO2 28 02/28/2016 0318   CO2 28 02/21/2016 0931   GLUCOSE 100* 02/28/2016 0318   GLUCOSE 75 02/21/2016 0931   GLUCOSE 99 03/10/2013 1352   BUN 11 02/28/2016 0318   BUN 7.6 02/21/2016 0931   CREATININE 0.74 02/28/2016 0318   CREATININE 0.9 02/21/2016 0931   CALCIUM 9.1 02/28/2016 0318   CALCIUM 9.4 02/21/2016 0931   PROT 5.8* 02/28/2016 0318   PROT 5.9* 02/21/2016 0931   PROT 5.2* 02/21/2016 0931   ALBUMIN 3.1* 02/28/2016 0318   ALBUMIN 3.0* 02/21/2016 0931   AST 30 02/28/2016 0318   AST 24 02/21/2016 0931   ALT 19 02/28/2016 0318   ALT 13 02/21/2016 0931   ALKPHOS 58 02/28/2016 0318   ALKPHOS 60 02/21/2016 0931   BILITOT 0.5 02/28/2016 0318  BILITOT <0.30 02/21/2016 0931   GFRNONAA >60 02/28/2016 0318   GFRAA >60 02/28/2016 0318    INo results found for: SPEP, UPEP  Lab Results  Component Value Date   WBC 3.3* 02/28/2016   NEUTROABS 1.5* 02/28/2016   HGB 10.2*  02/28/2016   HCT 30.7* 02/28/2016   MCV 90.3 02/28/2016   PLT 140* 02/28/2016    _0 @  No results found for: LABCA2  No components found for: LABCA125   Recent Labs Lab 02/27/16 1840  INR 1.07    Urinalysis    Component Value Date/Time   COLORURINE YELLOW 02/26/2016 2039   APPEARANCEUR CLEAR 02/26/2016 2039   APPEARANCEUR Clear 12/22/2015 1614   LABSPEC 1.017 02/26/2016 2039   PHURINE 7.0 02/26/2016 2039   GLUCOSEU NEGATIVE 02/26/2016 2039   GLUCOSEU NEGATIVE 07/08/2015 1050   HGBUR SMALL* 02/26/2016 2039   BILIRUBINUR NEGATIVE 02/26/2016 2039   BILIRUBINUR Negative 12/22/2015 [place] 02/26/2016 2039   PROTEINUR NEGATIVE 02/26/2016 2039   PROTEINUR 1+* 12/22/2015 1614   UROBILINOGEN 0.2 07/08/2015 1050   NITRITE NEGATIVE 02/26/2016 2039   NITRITE Negative 12/22/2015 1614   LEUKOCYTESUR NEGATIVE 02/26/2016 2039   LEUKOCYTESUR Negative 12/22/2015 1614     STUDIES: Dg Chest 2 View  02/26/2016  CLINICAL DATA:  FALL, PAIN Pt c/o back pain diffusely x 1 day, family states multiple falls today, weakness, chemotherapy pt. EXAM: CHEST  2 VIEW COMPARISON:  12/22/2015 FINDINGS: The heart size and mediastinal contours are within normal limits. Both lungs are clear. The visualized skeletal structures are unremarkable. IMPRESSION: No active cardiopulmonary disease. Electronically Signed   By: Skipper Cliche M.D.   On: 02/26/2016 19:07   Dg Lumbar Spine Complete  02/26/2016  CLINICAL DATA:  Diffuse back pain for 1 day. Multiple falls today. Multiple myeloma. EXAM: LUMBAR SPINE - COMPLETE 4+ VIEW COMPARISON:  The abdomen radiographs dated 11/08/2015 and abdomen and pelvis CT dated 11/07/2015. FINDINGS: Transitional thoracolumbar vertebra followed by 5 non-rib-bearing lumbar vertebrae. Stable mild to moderate levoconvex rotary scoliosis. Mild to moderate anterior and posterior spur formation at multiple levels. Facet degenerative changes in the mid and  lower lumbar spine. An approximately 20% T12 superior endplate compression deformity with sclerosis is unchanged. No bony retropulsion. No acute fracture or subluxation. No pars defects. Mild atheromatous arterial calcifications. IMPRESSION: 1. No acute fracture or subluxation. 2. Stable old T12 compression deformity. 3. Multilevel degenerative changes. Electronically Signed   By: Claudie Revering M.D.   On: 02/26/2016 19:07   Mr Lumbar Spine W Wo Contrast  02/27/2016  CLINICAL DATA:  Back pain right worse than left. History of multiple myeloma. Low-grade fever. Fatigue. Recent falls. EXAM: MRI LUMBAR SPINE WITHOUT AND WITH CONTRAST TECHNIQUE: Multiplanar and multiecho pulse sequences of the lumbar spine were obtained without and with intravenous contrast. CONTRAST:  56m MULTIHANCE GADOBENATE DIMEGLUMINE 529 MG/ML IV SOLN COMPARISON:  Radiography 02/26/2016. Abdominal CT 11/07/2015. Head CT 04/01/2013. FINDINGS: Segmentation:  5 lumbar type vertebral bodies. Alignment:  Curvature convex to the left with the apex at L2-3. Vertebrae: Old superior endplate fracture anteriorly at T12 with loss of height of 20%. Completely healed without residual edema. No evidence of recent fracture. Marrow pattern is somewhat heterogeneous which can be normal. There are no enhancing or destructive marrow space lesions in the region image to. Conus medullaris: Extends to the T12 level and appears normal. Paraspinal and other soft tissues: No abnormal finding. Disc levels: L1-2: Normal interspace. L2-3: Small central disc herniation indents the  thecal sac slightly but does not affect the neural structures. Likely incidental. No stenosis. L3-4:  Mild bulging of the disc.  No stenosis. L4-5: Mild bulging of the disc. Mild facet hypertrophy. No stenosis. Facet arthritis could be associated with low back pain. L5-S1: Minimal bulging of the disc. Mild facet degeneration. No stenosis. Facet arthritis could be associated with mild low back  pain. Sacroiliac joints appear unremarkable. IMPRESSION: Old healed minor superior endplate fracture anteriorly at T12. No recent fracture. Marrow pattern is somewhat heterogeneous, but is one of the patterns of normal. There are no enhancing lesions or destructive lesions. No statement can be made about the presence of myeloma cells given this pattern. No stenosis or neural compression. Mild age related desiccation and bulging of the discs. Mild facet osteoarthritis at L4-5 and L5-S1, which could be associated with low back pain. Electronically Signed   By: [name] Chimes M.D.   On: 02/27/2016 09:48    ASSESSMENT: 65 y.o. [place] man with a history of multiple myeloma diagnosed February of 2012, with an initial M-spike of 2.6 g/dL, IFE showing an IgG lambda paraprotein and free lambda light chains in the urine. Cytogenetics showed trisomy 51. Bone marrow biopsy showed a 22% plasmacytosis. Treated with   (1) bortezomib (subcutaneously), lenalidomide, and dexamethasone, with repeat bone marrow biopsy May of 2012 showing 10% plasmacytosis   (2) high-dose chemotherapy with BCNU and melphalan at [facility], followed by stem cell rescue July of 2012   (3) on zoledronic acid started December of 2012, initially monthly, currently given every 3 months, most recent dose 12/07/2015  (4) low-dose lenalidomide resumed April 2013, interrupted several times. Resumed again on 02/19/2013, ata dose of 5 mg daily, 21 days on and 7 days off, later further reduced to 7 days on, 7 days off  (5) CNS symptoms and abnormal brain MRI extensively evaluated by neurology with no definitive diagnosis established  (6) rising M spike noted June 2014 but did not meet criteria for carfilzomib study  (7) on lenalidomide 25 mg daily, 14 days on, 7 days off, starting 04/18/2013, interrupted December 2014 because of rash;  (a) resumed January 2015 at 10 mg/ day at 21 days on/ 7 days off ([place]) starting 08/18/2014  decreased to 10 mg/ day 14 days on, 7 days off because of cytopenias (c) as of February 2016 was on 5 mg daily 7 days on 7 days off (d) lenalidomide discontinued December 2016 with evidence of disease progression  (8) transient global amnesia 05/29/2015, resolved without intervention  (9) starting PVD 10/25/2015 w ASA 325 thromboprophylaxis, valacyclovir prophylaxis, last dose 12/17/2015 (a) pomalidomide 4 mg/d days 1-14 ([place]) bortezomib sQ days 2,5,9,12 of each 21 day cycle (c) dexamethasome 20 mg two days a week  (10) metapneumovirus pneumonia April 2017 (a) completing course of steroids and week of bactrim mid April 2017  (11) status post second autologous transplant at [facility] 02/04/2016(preparatory regimen melphalan 200 mg/m)  PLAN: Not sure exactly what happened prior to admission--wife not present. He may simply have been dehydrated. I think the idea of d/c to a Rehab facility temporarily is a good one. From a myeloma point of view he had good marrow recovery from his transplant preparatory regimen and he is currently in remission. Will copy Dr Alvie Heidelberg at [facility] for her input.  He does need weekly labs, which have been ordered through our office but can be done at the Rehab facility if he ends up being discharged to one for now.  Appreciate your  help to this patient!  Chauncey Cruel, MD   02/28/2016 8:07 AM Medical Oncology and Hematology [facility] [address] [place], [place] [address] Tel. [phone]    Fax. [phone]

## 2016-02-28 NOTE — Clinical Social Work Note (Signed)
Clinical Social Work Assessment  Patient Details  Name: Andre [name] MRN: [id number] Date of Birth: March 12, 1951  Date of referral:  02/28/16               Reason for consult:  Facility Placement, Discharge Planning                Permission sought to share information with:  Case Manager, Customer service manager, Family Supports Permission granted to share information::  Yes, Verbal Permission Granted  Name::        Agency::  SNF search  Relationship::  Cammy (pt wife)  Contact Information:     Housing/Transportation Living arrangements for the past 2 months:  Single Family Home Source of Information:  Patient, Medical Team, Spouse Patient Interpreter Needed:  None Criminal Activity/Legal Involvement Pertinent to Current Situation/Hospitalization:  No - Comment as needed Significant Relationships:  Spouse, Other Family Members Lives with:  Spouse Do you feel safe going back to the place where you live?  Yes Need for family participation in patient care:  Yes (Comment)  Care giving concerns:  Assessment completed with wife via phone as she called Social Worker expressing interest in SNF. Reports she does not feel comfortable taking patient home due to recent acute stay at [facility] for stem transplant and compromised immune system. Wife reports she will be there with him at home if needed, but feels like he would do better in short term rehab facility. Patient at first was refusing, at this time he is agreeable to SNF at DC.   Social Worker assessment / plan:  LCSW completed assessment and explained role. Consult for new facility placement. LCSW explained process for SNF placement. Extra time spent with wife explaining process for insurance authorization, private pay and skilled needs. Patient at this time is walking 130ft with device (RW).  He will most likely be difficult to place due to increased indepdence and walking. He does not require wound therapy or IV therapies per  discussion with MD. Wife would still like work up to see if authorization can be obtained. She has not ruled out private pay and LCSW has given her ball park figures to understand room and board, PT and medications. Wife is interested in [facility] if insurance will pay. She plans to go to facility to view. LCSW has faxed patient out and completed FL2 and obtained passar. Call placed to [facility] to review referral and check for insurance. This is pending and LCSW will follow up.   Employment status:  Therapist, music:  Managed Care PT Recommendations:  24 Hour Supervision, [place] / Referral to community resources:  [place]  Patient/Family's Response to care:  Agreeable to plan  Patient/Family's Understanding of and Emotional Response to Diagnosis, Current Treatment, and Prognosis:  Wife very knowledgeable about patient current diagnosis and how his CA treatment has played a major role in need for SNF. She verbalizes concern and safety issues with regards to his DC. She is proactive in care as she is going to view facilities discussed above and ask questions.  Emotional Assessment Appearance:  Appears stated age Attitude/Demeanor/Rapport:  Other (Cooperative and pleasant) Affect (typically observed):  Accepting, Adaptable Orientation:  Oriented to Place, Oriented to Self, Oriented to  Time, Oriented to Situation Alcohol / Substance use:  Not Applicable Psych involvement (Current and /or in the community):  No (Comment)  Discharge Needs  Concerns to be addressed:  No discharge needs identified Readmission within  the last 30 days:  No Current discharge risk:  None Barriers to Discharge:  Ship broker, Continued Medical Work up   Rein, Heis 02/28/2016, 12:41 PM

## 2016-02-28 NOTE — Progress Notes (Signed)
Nutrition Brief Note  Patient identified on the Malnutrition Screening Tool (MST) Report  Patient in room with OT. Pt's wife reports he ate a good breakfast. Sometimes his appetite fluctuates but overall is good. Pt has been ordered Ensure supplements and is drinking them. Weight is relatively stable.  Wt Readings from Last 15 Encounters:  02/27/16 182 lb 14.4 oz (82.963 kg)  02/27/16 182 lb (82.555 kg)  02/21/16 192 lb 9.6 oz (87.363 kg)  01/07/16 183 lb 11.2 oz (83.326 kg)  01/06/16 184 lb (83.462 kg)  12/28/15 185 lb (83.915 kg)  12/23/15 185 lb 3.2 oz (84.006 kg)  12/20/15 192 lb 12.8 oz (87.454 kg)  12/07/15 187 lb (84.823 kg)  11/26/15 190 lb 1.6 oz (86.229 kg)  11/17/15 188 lb (85.276 kg)  11/16/15 187 lb 1.6 oz (84.868 kg)  11/07/15 186 lb (84.369 kg)  11/05/15 189 lb 3.2 oz (85.821 kg)  10/29/15 187 lb 6.4 oz (85.004 kg)    Body mass index is 27 kg/(m^2). Patient meets criteria for overweight based on current BMI.   Current diet order is Heart Healthy, patient is consuming approximately 50-100% of meals at this time. Labs and medications reviewed.   No nutrition interventions warranted at this time. If nutrition issues arise, please consult RD.   Clayton Bibles, MS, RD, LDN Pager: [phone] After Hours Pager: [phone]

## 2016-02-28 NOTE — Clinical Social Work Placement (Signed)
   CLINICAL SOCIAL WORK PLACEMENT  NOTE  Date:  02/28/2016  Patient Details  Name: Andre [name] MRN: [id number] Date of Birth: 09-26-1950  Clinical Social Work is seeking post-discharge placement for this patient at the [place] level of care (*CSW will initial, date and re-position this form in  chart as items are completed):  Yes   Patient/family provided with [facility] Work Department's list of facilities offering this level of care within the geographic area requested by the patient (or if unable, by the patient's family).  Yes   Patient/family informed of their freedom to choose among providers that offer the needed level of care, that participate in Medicare, Medicaid or managed care program needed by the patient, have an available bed and are willing to accept the patient.  Yes   Patient/family informed of [facility]'s ownership interest in [facility] and [facility], as well as of the fact that they are under no obligation to receive care at these facilities.  PASRR submitted to EDS on 02/28/16     PASRR number received on 02/28/16     Existing PASRR number confirmed on       FL2 transmitted to all facilities in geographic area requested by pt/family on 02/28/16     FL2 transmitted to all facilities within larger geographic area on       Patient informed that his/her managed care company has contracts with or will negotiate with certain facilities, including the following:            Patient/family informed of bed offers received.  Patient chooses bed at       Physician recommends and patient chooses bed at      Patient to be transferred to   on  .  Patient to be transferred to facility by       Patient family notified on   of transfer.  Name of family member notified:        PHYSICIAN Please sign FL2     Additional Comment:    _______________________________________________ Lilly [place], LCSW 02/28/2016,  12:38 PM

## 2016-02-28 NOTE — Care Management Note (Signed)
Case Management Note  Patient Details  Name:  Andre [name] MRN: [id number] Date of Birth: 02/01/1951  Subjective/Objective:         64 yo admitted with Multiple Myeloma           Action/Plan: From home with spouse.  This CM met with pt and wife at bedside to discuss dc planning.  Pt and wife feel that short term SNF would be best option for pt at this time.  CSW alerted of pt plan. No other CM needs communicated.  Expected Discharge Date:                  Expected Discharge Plan:  Skilled Nursing Facility  In-House Referral:  Clinical Social Work  Discharge planning Services  CM Consult  Post Acute Care Choice:    Choice offered to:     DME Arranged:    DME Agency:     HH Arranged:    HH Agency:     Status of Service:  Completed, signed off  Medicare Important Message Given:    Date Medicare IM Given:    Medicare IM give by:    Date Additional Medicare IM Given:    Additional Medicare Important Message give by:     If discussed at Long Length of Stay Meetings, dates discussed:    Additional Comments:  ,  H, RN 02/28/2016, 10:46 AM  [phone]  

## 2016-02-28 NOTE — [place] FL2 (Signed)
[place] MEDICAID FL2 LEVEL OF CARE SCREENING TOOL     IDENTIFICATION  Patient Name: Andre [name] Birthdate: Feb 07, 1951 Sex: male Admission Date (Current Location): 02/26/2016  [facility] and [place] Number:  Herbalist and Address:  [facility],  [place] [address] [place]      Provider Number: [id number]  Attending Physician Name and Address:  Lavina Hamman, MD  Relative Name and Phone Number:       Current Level of Care: Hospital Recommended Level of Care: [place] Prior Approval Number:    Date Approved/Denied:   PASRR Number:   [id number]   Discharge Plan: SNF    Current Diagnoses: Patient Active Problem List   Diagnosis Date Noted  . Debility 02/26/2016  . Back pain 02/26/2016  . Rash and nonspecific skin eruption 01/06/2016  . HCAP (healthcare-associated pneumonia)   . Esophageal reflux   . Dehydration 12/22/2015  . Weakness 12/22/2015  . Fever 12/22/2015  . Asthma exacerbation 12/22/2015  . Hyponatremia 12/22/2015  . General weakness   . Multiple myeloma not having achieved remission ([place])   . Sepsis ([place])   . Chronic anemia   . SBO (small bowel obstruction) ([place]) 11/07/2015  . Thrombocytopenia ([place]) 11/05/2015  . Actinic keratosis 07/08/2015  . Transient global amnesia 06/07/2015  . Raynaud's phenomenon 09/17/2013  . Hemorrhage of rectum and anus 09/15/2013  . Skin lesion, superficial 07/29/2013  . Cough 01/10/2013  . Abnormal brain MRI 10/08/2012  . Vitamin D deficiency 09/30/2012  . Hypothyroidism 09/30/2012  . Hypogonadism male 09/30/2012  . Fatigue 09/27/2012  . Insomnia 09/27/2012  . Reactive depression (situational) 09/27/2012  . TIA (transient ischemic attack) 03/27/2012  . Transient diplopia 03/27/2012  . Multiple myeloma ([place]) 07/04/2011  . HYPERLIPIDEMIA 05/30/2010  . ANEMIA 05/30/2010  . Asthma 05/30/2010  . SCHATZKI'S RING 05/30/2010  . Peptic ulcer disease 05/30/2010  .  HIATAL HERNIA 05/30/2010  . SLEEP APNEA, MILD 05/30/2010  . TRANSAMINASES, SERUM, ELEVATED 05/30/2010    Orientation RESPIRATION BLADDER Height & Weight     Self, Time, Situation, Place  Normal Continent Weight: 182 lb 14.4 oz (82.963 kg) Height:  5' 9"  (175.3 cm)  BEHAVIORAL SYMPTOMS/MOOD NEUROLOGICAL BOWEL NUTRITION STATUS      Continent Diet (Heart Healthy)  AMBULATORY STATUS COMMUNICATION OF NEEDS Skin   Limited Assist Verbally Normal                       Personal Care Assistance Level of Assistance  Bathing, Feeding, Dressing Bathing Assistance: Limited assistance Feeding assistance: Independent Dressing Assistance: Independent     Functional Limitations Info  Sight, Hearing, Speech Sight Info: Adequate Hearing Info: Adequate Speech Info: Adequate    SPECIAL CARE FACTORS FREQUENCY  PT (By licensed PT), OT (By licensed OT)     PT Frequency: 3x OT Frequency: 3x            Contractures Contractures Info: Not present    Additional Factors Info  Code Status, Allergies, Psychotropic Code Status Info: Full Code Allergies Info: Atorvastatin, Rosuvastatin, Crestor, Lipitor, Septra Psychotropic Info: Lexapro         Current Medications (02/28/2016):  This is the current hospital active medication list Current Facility-Administered Medications  Medication Dose Route Frequency Provider Last Rate Last Dose  . acetaminophen (TYLENOL) tablet 650 mg  650 mg Oral Q6H PRN Toy Baker, MD       Or  . acetaminophen (TYLENOL) suppository 650 mg  650 mg Rectal Q6H PRN Toy Baker, MD      . albuterol (PROVENTIL) (2.5 MG/3ML) 0.083% nebulizer solution 2.5 mg  2.5 mg Nebulization Q6H PRN Toy Baker, MD      . aspirin tablet 325 mg  325 mg Oral Daily Toy Baker, MD   325 mg at 02/28/16 0735  . bisacodyl (DULCOLAX) EC tablet 5 mg  5 mg Oral Daily PRN Toy Baker, MD      . ceFEPIme (MAXIPIME) 2 g in dextrose 5 % 50 mL IVPB  2 g  Intravenous Q8H Lavina Hamman, MD   2 g at 02/28/16 0513  . cyclobenzaprine (FLEXERIL) tablet 5 mg  5 mg Oral TID Lavina Hamman, MD   5 mg at 02/28/16 0735  . diphenhydrAMINE (BENADRYL) capsule 25 mg  25 mg Oral Q6H PRN Lavina Hamman, MD   25 mg at 02/27/16 1907  . docusate sodium (COLACE) capsule 100 mg  100 mg Oral Daily PRN Toy Baker, MD      . escitalopram (LEXAPRO) tablet 10 mg  10 mg Oral Daily Toy Baker, MD   10 mg at 02/27/16 2128  . famotidine (PEPCID) IVPB 20 mg premix  20 mg Intravenous Q12H Lavina Hamman, MD   20 mg at 02/28/16 0735  . feeding supplement (ENSURE ENLIVE) (ENSURE ENLIVE) liquid 237 mL  237 mL Oral BID BM Anastassia Doutova, MD   237 mL at 02/28/16 0800  . HYDROcodone-acetaminophen (NORCO/VICODIN) 5-325 MG per tablet 1-2 tablet  1-2 tablet Oral Q4H PRN Toy Baker, MD   2 tablet at 02/27/16 2020  . levothyroxine (SYNTHROID, LEVOTHROID) tablet 200 mcg  200 mcg Oral QAC breakfast Toy Baker, MD   200 mcg at 02/28/16 0735  . loratadine (CLARITIN) tablet 10 mg  10 mg Oral Daily Toy Baker, MD   10 mg at 02/28/16 0735  . LORazepam (ATIVAN) tablet 0.5-1 mg  0.5-1 mg Oral Q6H PRN Toy Baker, MD      . ondansetron (ZOFRAN) tablet 4 mg  4 mg Oral Q6H PRN Toy Baker, MD       Or  . ondansetron (ZOFRAN) injection 4 mg  4 mg Intravenous Q6H PRN Toy Baker, MD   4 mg at 02/27/16 1635  . oxyCODONE (Oxy IR/ROXICODONE) immediate release tablet 5-10 mg  5-10 mg Oral Q6H PRN Toy Baker, MD      . pantoprazole (PROTONIX) EC tablet 40 mg  40 mg Oral Daily PRN Toy Baker, MD      . polyethylene glycol (MIRALAX / GLYCOLAX) packet 17 g  17 g Oral Daily PRN Toy Baker, MD      . prochlorperazine (COMPAZINE) tablet 10 mg  10 mg Oral Q6H PRN Toy Baker, MD      . valACYclovir (VALTREX) tablet 500 mg  500 mg Oral Daily Toy Baker, MD   500 mg at 02/28/16 1019  . vancomycin (VANCOCIN) IVPB  1000 mg/200 mL premix  1,000 mg Intravenous Q12H Christine E Shade, RPH   1,000 mg at 02/28/16 0734  . zolpidem (AMBIEN) tablet 5 mg  5 mg Oral QHS PRN Toy Baker, MD   5 mg at 02/27/16 0029     Discharge Medications: Please see discharge summary for a list of discharge medications.  Relevant Imaging Results:  Relevant Lab Results:   Additional Information SSN: [id number]    (patient is currently not having chemo or radition, this is planned for the future, but not current).   Not anticipating IV  antibiotics at DC, will most likely switch to PO  Cowen Pesqueira, Evie Lacks, LCSW

## 2016-02-28 NOTE — Progress Notes (Signed)
[facility] Progress Note  Patient: Andre [name] [id number]   PCP: [name], MD DOB: March 06, 1951   DOA: 02/26/2016   DOS: 02/28/2016   Date of Service: the patient was seen and examined on 02/28/2016  Subjective: Patient is mentating better this morning. Rash is also improving. No nausea no vomiting or shortness of breath. No abdominal pain. Patient continues to complain of mild right flank pain. Nutrition: Tolerating oral diet  Brief hospital course: Patient was admitted on 02/26/2016, with complaint of fall and back pain, was found to have fever with low WBC. Currently further plan is continue IV antibiotics.  Assessment and Plan: 1. Multiple myeloma ([place]) Initial autologous transplant in 2012, recent autologous stem cell rescue 02/04/2016 granix was given for WBC recovery. Treated for febrile neutropenia as well as diarrhea with negative workup and discharged on 02/18/2016. Patient has been complaining of back pain ever since and presented with a fall and generalized weakness. At present due to decreasing WBC as well as low-grade temperature the patient will be started on cefepime and vancomycin to cover him for possible infection. I discussed the case with patient's primary oncologist, who agrees with continuing IV antibiotics.  2. Papular rash on the chest. NOT-Spreading further Blanchable non-itching. Located around the tape which has been removed. Possibility of allergic reaction. We will use Benadryl and Pepcid. Add hydrocortisone ointment.  3. Acute encephalopathy. Resolved Patient has mild confusion at present, at his baseline the patient has some confusion as per wife but it is more profound. Possibility is due to pain medications as well as Flexeril. We will continue to closely monitor the patient at present.  4. Hypothyroidism. Stable TSH free T. Continue Synthroid  5. Back pain. Patient's primary reason for admission. Examination does not reveal any  neurological component. MRI spine was also unremarkable. We will continue to closely monitor.  Pain management: OxyIR from Percocet due to Tylenol component. Continue when necessary Tylenol, continue when necessary Norco. Added schedule Flexeril Activity: consulted physical therapy Bowel regimen: last BM 02/26/2016, stool softeners added Diet: Regular diet DVT Prophylaxis: subcutaneous Heparin  Advance goals of care discussion: Full code  Family Communication: family was present at bedside, at the time of interview. The pt provided permission to discuss medical plan with the family. Opportunity was given to ask question and all questions were answered satisfactorily.   Disposition:  Discharge to home, with home health Expected discharge date: 02/29/2016  Consultants: Medical oncology Procedures: None  Antibiotics: Anti-infectives    Start     Dose/Rate Route Frequency Ordered Stop   02/28/16 0800  vancomycin (VANCOCIN) IVPB 1000 mg/200 mL premix     1,000 mg 200 mL/hr over 60 Minutes Intravenous Every 12 hours 02/27/16 1856     02/27/16 1900  ceFEPIme (MAXIPIME) 2 g in dextrose 5 % 50 mL IVPB     2 g 100 mL/hr over 30 Minutes Intravenous Every 8 hours 02/27/16 1826     02/27/16 1900  vancomycin (VANCOCIN) IVPB 1000 mg/200 mL premix     1,000 mg 200 mL/hr over 60 Minutes Intravenous STAT 02/27/16 1856 02/27/16 2014   02/27/16 1000  valACYclovir (VALTREX) tablet 500 mg     500 mg Oral Daily 02/26/16 2354          Intake/Output Summary (Last 24 hours) at 02/28/16 1618 Last data filed at 02/28/16 1521  Gross per 24 hour  Intake    350 ml  Output   2700 ml  Net  -2350 ml  Filed Weights   02/27/16 0012  Weight: 82.963 kg (182 lb 14.4 oz)    Objective: Physical Exam: Filed Vitals:   02/27/16 2212 02/28/16 0158 02/28/16 0520 02/28/16 1519  BP: 136/83 138/77 146/80 133/91  Pulse: 97 75 81 91  Temp: 99.3 F (37.4 C) 97.5 F (36.4 C) 98.6 F (37 C) 98.2 F (36.8 C)    TempSrc: Oral Oral Oral Oral  Resp: 16 20 18 18   Height:      Weight:      SpO2: 95% 97% 97% 95%    General: Alert, Awake and Oriented to Time, Place and Person. Appear in mild distress Eyes: PERRL, Conjunctiva normal ENT: Oral Mucosa clear moist. Neck: no JVD, no Abnormal Mass Or lumps Cardiovascular: S1 and S2 Present, no Murmur, Peripheral Pulses Present Respiratory: Bilateral Air entry equal and Decreased, Clear to Auscultation, no Crackles, no wheezes Abdomen: Bowel Sound present, Soft and no tenderness Skin: no redness, maculopapular Rash in the right chest Extremities: no Pedal edema, no calf tenderness Neurologic: Grossly no focal neuro deficit. Bilaterally Equal motor strength  Data Reviewed: CBC:  Recent Labs Lab 02/26/16 1923 02/27/16 0355 02/27/16 1840 02/28/16 0318  WBC 4.9 3.8* 4.1 3.3*  NEUTROABS 2.9  --  2.2 1.5*  HGB 10.7* 10.5* 10.2* 10.2*  HCT 31.4* 31.2* 29.6* 30.7*  MCV 88.5 90.2 87.6 90.3  PLT 174 157 142* 491*   Basic Metabolic Panel:  Recent Labs Lab 02/26/16 1923 02/27/16 0355 02/28/16 0318  NA 136 136 137  K 3.8 3.7 4.0  CL 102 105 105  CO2 26 25 28   GLUCOSE 98 90 100*  BUN 14 11 11   CREATININE 0.96 0.89 0.74  CALCIUM 9.1 8.6* 9.1  MG  --  1.7 2.2  PHOS  --  2.7  --     Liver Function Tests:  Recent Labs Lab 02/26/16 1923 02/27/16 0355 02/28/16 0318  AST 34 29 30  ALT 20 19 19   ALKPHOS 61 56 58  BILITOT 0.4 0.5 0.5  PROT 6.6 6.0* 5.8*  ALBUMIN 3.5 3.2* 3.1*   No results for input(s): LIPASE, AMYLASE in the last 168 hours. No results for input(s): AMMONIA in the last 168 hours. Coagulation Profile:  Recent Labs Lab 02/27/16 1840  INR 1.07   Cardiac Enzymes:  Recent Labs Lab 02/26/16 1923  TROPONINI <0.03   BNP (last 3 results) No results for input(s): PROBNP in the last 8760 hours.  CBG: No results for input(s): GLUCAP in the last 168 hours.  Studies: No results found.   Scheduled Meds: . aspirin   325 mg Oral Daily  . ceFEPime (MAXIPIME) IV  2 g Intravenous Q8H  . cyclobenzaprine  5 mg Oral TID  . escitalopram  10 mg Oral Daily  . famotidine (PEPCID) IV  20 mg Intravenous Q12H  . feeding supplement (ENSURE ENLIVE)  237 mL Oral BID BM  . hydrocortisone cream   Topical BID  . levothyroxine  200 mcg Oral QAC breakfast  . loratadine  10 mg Oral Daily  . valACYclovir  500 mg Oral Daily  . vancomycin  1,000 mg Intravenous Q12H   Continuous Infusions:  PRN Meds: acetaminophen **OR** acetaminophen, albuterol, bisacodyl, diphenhydrAMINE, docusate sodium, HYDROcodone-acetaminophen, LORazepam, ondansetron **OR** ondansetron (ZOFRAN) IV, oxyCODONE, pantoprazole, polyethylene glycol, prochlorperazine, zolpidem  Time spent: 30 minutes  Author: Berle Mull, MD [facility] Pager: [phone] 02/28/2016 4:18 PM  If 7PM-7AM, please contact night-coverage at [email or web address], password [facility]

## 2016-02-29 ENCOUNTER — Other Ambulatory Visit: Payer: Self-pay

## 2016-02-29 ENCOUNTER — Other Ambulatory Visit: Payer: Commercial Managed Care - PPO

## 2016-02-29 ENCOUNTER — Telehealth: Payer: Self-pay | Admitting: Oncology

## 2016-02-29 DIAGNOSIS — C9000 Multiple myeloma not having achieved remission: Secondary | ICD-10-CM

## 2016-02-29 DIAGNOSIS — K274 Chronic or unspecified peptic ulcer, site unspecified, with hemorrhage: Secondary | ICD-10-CM | POA: Diagnosis not present

## 2016-02-29 DIAGNOSIS — E039 Hypothyroidism, unspecified: Secondary | ICD-10-CM | POA: Diagnosis not present

## 2016-02-29 DIAGNOSIS — R5383 Other fatigue: Secondary | ICD-10-CM | POA: Diagnosis not present

## 2016-02-29 DIAGNOSIS — R410 Disorientation, unspecified: Secondary | ICD-10-CM | POA: Diagnosis not present

## 2016-02-29 DIAGNOSIS — M545 Low back pain: Secondary | ICD-10-CM

## 2016-02-29 DIAGNOSIS — C9002 Multiple myeloma in relapse: Secondary | ICD-10-CM | POA: Diagnosis not present

## 2016-02-29 LAB — CBC WITH DIFFERENTIAL/PLATELET
BASOS ABS: 0 10*3/uL (ref 0.0–0.1)
BASOS PCT: 1 %
EOS ABS: 0.2 10*3/uL (ref 0.0–0.7)
Eosinophils Relative: 4 %
HCT: 31.6 % — ABNORMAL LOW (ref 39.0–52.0)
Hemoglobin: 10.7 g/dL — ABNORMAL LOW (ref 13.0–17.0)
Lymphocytes Relative: 30 %
Lymphs Abs: 1.4 10*3/uL (ref 0.7–4.0)
MCH: 29.7 pg (ref 26.0–34.0)
MCHC: 33.9 g/dL (ref 30.0–36.0)
MCV: 87.8 fL (ref 78.0–100.0)
MONO ABS: 1 10*3/uL (ref 0.1–1.0)
Monocytes Relative: 23 %
NEUTROS ABS: 1.9 10*3/uL (ref 1.7–7.7)
NEUTROS PCT: 42 %
PLATELETS: 145 10*3/uL — AB (ref 150–400)
RBC: 3.6 MIL/uL — ABNORMAL LOW (ref 4.22–5.81)
RDW: 18.2 % — ABNORMAL HIGH (ref 11.5–15.5)
WBC: 4.5 10*3/uL (ref 4.0–10.5)

## 2016-02-29 MED ORDER — ALUM & MAG HYDROXIDE-SIMETH 200-200-20 MG/5ML PO SUSP
15.0000 mL | ORAL | Status: DC | PRN
  Administered 2016-02-29 – 2016-03-01 (×2): 15 mL via ORAL
  Filled 2016-02-29 (×2): qty 30

## 2016-02-29 MED ORDER — HYDROCORTISONE 0.5 % EX CREA
TOPICAL_CREAM | Freq: Three times a day (TID) | CUTANEOUS | Status: DC
  Administered 2016-02-29 – 2016-03-01 (×3): via TOPICAL
  Filled 2016-02-29: qty 28.35

## 2016-02-29 MED ORDER — LEVOFLOXACIN 750 MG PO TABS
750.0000 mg | ORAL_TABLET | ORAL | Status: DC
  Administered 2016-02-29: 750 mg via ORAL
  Filled 2016-02-29: qty 1

## 2016-02-29 MED ORDER — ENSURE ENLIVE PO LIQD: 237.0000 mL | Freq: Two times a day (BID) | ORAL | Status: DC

## 2016-02-29 MED ORDER — LEVOTHYROXINE SODIUM 50 MCG PO TABS
150.0000 ug | ORAL_TABLET | Freq: Every day | ORAL | Status: DC
  Administered 2016-03-01: 150 ug via ORAL
  Filled 2016-02-29: qty 1

## 2016-02-29 MED ORDER — SODIUM CHLORIDE 0.9 % IV SOLN
INTRAVENOUS | Status: DC
  Administered 2016-02-29: 18:00:00 via INTRAVENOUS

## 2016-02-29 MED ORDER — CYCLOBENZAPRINE HCL 5 MG PO TABS: 5.0000 mg | ORAL_TABLET | Freq: Three times a day (TID) | ORAL | Status: DC | PRN

## 2016-02-29 MED ORDER — LEVOFLOXACIN 750 MG PO TABS: 750.0000 mg | ORAL_TABLET | ORAL | Status: AC

## 2016-02-29 MED ORDER — HYDROCORTISONE 0.5 % EX CREA: TOPICAL_CREAM | Freq: Three times a day (TID) | CUTANEOUS | Status: AC

## 2016-02-29 MED ORDER — SODIUM CHLORIDE 0.9 % IV BOLUS (SEPSIS)
1000.0000 mL | INTRAVENOUS | Status: AC
  Administered 2016-02-29 (×2): 1000 mL via INTRAVENOUS

## 2016-02-29 NOTE — Discharge Summary (Addendum)
[facility] Discharge Summary   Patient: Andre [name] SKA:[id number]   PCP: [name], MD DOB: 08/01/1951   Date of admission: 02/26/2016   Date of discharge:  03/01/2016    Discharge Diagnoses:  Principal Problem:   Multiple myeloma ([place]) Active Problems:   ANEMIA   Hypothyroidism   Dehydration   Fever   Rash and nonspecific skin eruption   Debility   Back pain  Admitted From: home Disposition:  SNF  Recommendations for Outpatient Follow-up:  1.  Please follow-up with PCP in one week. 2. Please follow-up with oncology on 622 for blood work.  Follow-up Information    Follow up with [facility].   Specialty:  [place] information:   [place] [phone]      Follow up with [name], MD. Schedule an appointment as soon as possible for a visit in 1 week.   Specialty:  Internal Medicine   Contact information:   [place] [address] [phone]       Follow up with Chauncey Cruel, MD. Go on 03/07/2016.   Specialty:  Oncology   Why:  for blood work   Contact information:   [place] [address] [phone]      Diet recommendation: Regular diet  Activity: The patient is advised to gradually reintroduce usual activities.  Discharge Condition: good  Code Status: Full code  History of present illness: As per the H and P dictated on admission, "Andre [name] is a 65 y.o. male with medical history significant of HTN. peptic ulcer disease chronic anemia, multiple myeloma   Presented with generalized fatigue he became so tired that when he fell down today he couldn't even get up. Denies any chest pain or shortness of breath no high fevers no chills does report some low-grade fevers. He has been falling while trying to get out of bed. Feels like has no strength to stand up. Reports ever since his transplant his  been feeling weaker. He denies any loss of consciousness no head injury. EMS was called Patient endorses back pain family states that generalized weakness have gotten worse since back pain started. Pain is over lumbar spine somewhat to her right, likely lifting his right leg has made the pain worse. Regarding pertinent Chronic problems: Multiple myeloma being followed at [facility] status post 2 autologous transplants last one was on 02/03/2016 Patient has history of peptic ulcer disease diagnosed in 2011 the significant bleeding and anemia"  Hospital Course:  Summary of his active problems in the hospital is as following. 1. Multiple myeloma ([place]) Initial autologous transplant in 2012, recent autologous stem cell rescue 02/04/2016 granix was given for WBC recovery. Treated for febrile neutropenia as well as diarrhea with negative workup and discharged on 02/18/2016. Patient has been complaining of back pain ever since and presented with a fall and generalized weakness. due to decreasing WBC, recent history of chemotherapy as well as low-grade temperature the patient was started on cefepime and vancomycin to cover him for possible infection. Blood cultures negative for 48 hours. No other acute infection identified. We will transition to oral Levaquin and finish a total 5 day treatment course.  2. Papular rash on the chest. Improving Blanchable non-itching. Located around the tape which has been removed. Possibility of allergic reaction. We will use Benadryl and Zantac. And hydrocortisone ointment.  3. Acute encephalopathy. Resolved Patient has mild confusion at present,  at his baseline the patient has some confusion as per wife but it is more profound. Possibility is due to pain medications as well as Flexeril. We will continue to closely monitor the patient at present.  4. Hypothyroidism. Stable TSH free T4. Continue Synthroid  5. Back pain. Improving Patient's primary reason for  admission. Examination does not reveal any neurological component. MRI spine was also unremarkable. We will continue to closely monitor.  6. Orthostatic hypotension. Generalized weakness. Improved with IV hydration.   All other chronic medical condition were stable during the hospitalization.  Patient was seen by physical therapy, who recommended SNF, which was arranged by Education officer, museum and case Freight forwarder. Patient is stable for discharge today  Procedures and Results:  none   Consultations:  Medical oncology  DISCHARGE MEDICATION: Current Discharge Medication List    START taking these medications   Details  cyclobenzaprine (FLEXERIL) 5 MG tablet Take 1 tablet (5 mg total) by mouth 3 (three) times daily as needed for muscle spasms. Qty: 30 tablet, Refills: 0    feeding supplement, ENSURE ENLIVE, (ENSURE ENLIVE) LIQD Take 237 mLs by mouth 2 (two) times daily between meals. Qty: 237 mL, Refills: 12    hydrocortisone cream 0.5 % Apply topically 3 (three) times daily. Qty: 30 g, Refills: 0    levofloxacin (LEVAQUIN) 750 MG tablet Take 1 tablet (750 mg total) by mouth daily. Qty: 1 tablet, Refills: 0      CONTINUE these medications which have NOT CHANGED   Details  albuterol (PROVENTIL HFA;VENTOLIN HFA) 108 (90 Base) MCG/ACT inhaler Inhale 2 puffs into the lungs every 4 (four) hours as needed for wheezing or shortness of breath. Qty: 1 Inhaler, Refills: 3    aspirin 325 MG tablet Take 325 mg by mouth daily.    Bisacodyl (DULCOLAX PO) Take 1 tablet by mouth daily as needed (for constipation). Reported on 12/07/2015    cetirizine (ZYRTEC) 10 MG tablet Take 10 mg by mouth daily.    docusate sodium (COLACE) 100 MG capsule Take 100 mg by mouth daily as needed for mild constipation or moderate constipation.    escitalopram (LEXAPRO) 10 MG tablet Take 1 tablet (10 mg total) by mouth daily. Qty: 30 tablet, Refills: 5    levothyroxine (SYNTHROID, LEVOTHROID) 150 MCG tablet Take  1 tablet by mouth  daily Qty: 90 tablet, Refills: 3    LORazepam (ATIVAN) 1 MG tablet Take 0.5-1 mg by mouth every 6 (six) hours as needed for anxiety (nausea).     ondansetron (ZOFRAN-ODT) 8 MG disintegrating tablet Take 8 mg by mouth every 8 (eight) hours as needed for nausea or vomiting.     oxyCODONE (OXY IR/ROXICODONE) 5 MG immediate release tablet Take 5 mg by mouth every 6 (six) hours as needed for moderate pain or severe pain.     pantoprazole (PROTONIX) 40 MG tablet Take 1 tablet (40 mg total) by mouth 2 (two) times daily before a meal. Qty: 60 tablet, Refills: 0    polyethylene glycol (MIRALAX / GLYCOLAX) packet Take 17 g by mouth daily as needed for mild constipation or moderate constipation.     prochlorperazine (COMPAZINE) 10 MG tablet Take 10 mg by mouth every 6 (six) hours as needed for nausea or vomiting.  Refills: 1    ranitidine (ZANTAC) 150 MG tablet Take 150 mg by mouth 2 (two) times daily.    valACYclovir (VALTREX) 1000 MG tablet Take 1 tablet (1,000 mg total) by mouth daily. Qty: 60 tablet, Refills: 6  zolendronic acid (ZOMETA) 4 MG/5ML injection Inject 4 mg into the vein every 3 (three) months.    zolpidem (AMBIEN) 5 MG tablet Take 1-2 tablets (5-10 mg total) by mouth at bedtime as needed for sleep. Qty: 30 tablet, Refills: 1    cholecalciferol (VITAMIN D) 1000 UNITS tablet Take 1 tablet (1,000 Units total) by mouth daily. Qty: 100 tablet, Refills: 3       Allergies  Allergen Reactions  . Atorvastatin Other (See Comments)  . Rosuvastatin     Other reaction(s): Liver Disorder  . Crestor [Rosuvastatin Calcium]     ADVERSE EFFECTS ON LIVER  . Lipitor [Atorvastatin Calcium]   . Septra [Sulfamethoxazole-Trimethoprim] Rash   Discharge Instructions    Diet - low sodium heart healthy    Complete by:  As directed      Discharge instructions    Complete by:  As directed   It is important that you read following instructions as well as go over your  medication list with RN to help you understand your care after this hospitalization.  Discharge Instructions: Please follow-up with PCP in one week  Please request your primary care physician to go over all Hospital Tests and Procedure/Radiological results at the follow up,  Please get all Hospital records sent to your PCP by signing hospital release before you go home.   Do not drive, operating heavy machinery, perform activities at heights, swimming or participation in water activities or provide baby sitting services while your are on Pain, Sleep and Anxiety Medications; until you have been seen by Primary Care Physician or a Neurologist and advised to do so again. Do not take more than prescribed Pain, Sleep and Anxiety Medications. You were cared for by a hospitalist during your hospital stay. If you have any questions about your discharge medications or the care you received while you were in the hospital after you are discharged, you can call the unit and ask to speak with the hospitalist on call if the hospitalist that took care of you is not available.  Once you are discharged, your primary care physician will handle any further medical issues. Please note that NO REFILLS for any discharge medications will be authorized once you are discharged, as it is imperative that you return to your primary care physician (or establish a relationship with a primary care physician if you do not have one) for your aftercare needs so that they can reassess your need for medications and monitor your lab values. You Must read complete instructions/literature along with all the possible adverse reactions/side effects for all the Medicines you take and that have been prescribed to you. Take any new Medicines after you have completely understood and accept all the possible adverse reactions/side effects. Wear Seat belts while driving.     Increase activity slowly    Complete by:  As directed             The results of significant diagnostics from this hospitalization (including imaging, microbiology, ancillary and laboratory) are listed below for reference.    Significant Diagnostic Studies: Dg Chest 2 View  02/26/2016  CLINICAL DATA:  FALL, PAIN Pt c/o back pain diffusely x 1 day, family states multiple falls today, weakness, chemotherapy pt. EXAM: CHEST  2 VIEW COMPARISON:  12/22/2015 FINDINGS: The heart size and mediastinal contours are within normal limits. Both lungs are clear. The visualized skeletal structures are unremarkable. IMPRESSION: No active cardiopulmonary disease. Electronically Signed   By: Elodia Florence.D.  On: 02/26/2016 19:07   Dg Lumbar Spine Complete  02/26/2016  CLINICAL DATA:  Diffuse back pain for 1 day. Multiple falls today. Multiple myeloma. EXAM: LUMBAR SPINE - COMPLETE 4+ VIEW COMPARISON:  The abdomen radiographs dated 11/08/2015 and abdomen and pelvis CT dated 11/07/2015. FINDINGS: Transitional thoracolumbar vertebra followed by 5 non-rib-bearing lumbar vertebrae. Stable mild to moderate levoconvex rotary scoliosis. Mild to moderate anterior and posterior spur formation at multiple levels. Facet degenerative changes in the mid and lower lumbar spine. An approximately 20% T12 superior endplate compression deformity with sclerosis is unchanged. No bony retropulsion. No acute fracture or subluxation. No pars defects. Mild atheromatous arterial calcifications. IMPRESSION: 1. No acute fracture or subluxation. 2. Stable old T12 compression deformity. 3. Multilevel degenerative changes. Electronically Signed   By: Claudie Revering M.D.   On: 02/26/2016 19:07   Mr Lumbar Spine W Wo Contrast  02/27/2016  CLINICAL DATA:  Back pain right worse than left. History of multiple myeloma. Low-grade fever. Fatigue. Recent falls. EXAM: MRI LUMBAR SPINE WITHOUT AND WITH CONTRAST TECHNIQUE: Multiplanar and multiecho pulse sequences of the lumbar spine were obtained without and with  intravenous contrast. CONTRAST:  79m MULTIHANCE GADOBENATE DIMEGLUMINE 529 MG/ML IV SOLN COMPARISON:  Radiography 02/26/2016. Abdominal CT 11/07/2015. Head CT 04/01/2013. FINDINGS: Segmentation:  5 lumbar type vertebral bodies. Alignment:  Curvature convex to the left with the apex at L2-3. Vertebrae: Old superior endplate fracture anteriorly at T12 with loss of height of 20%. Completely healed without residual edema. No evidence of recent fracture. Marrow pattern is somewhat heterogeneous which can be normal. There are no enhancing or destructive marrow space lesions in the region image to. Conus medullaris: Extends to the T12 level and appears normal. Paraspinal and other soft tissues: No abnormal finding. Disc levels: L1-2: Normal interspace. L2-3: Small central disc herniation indents the thecal sac slightly but does not affect the neural structures. Likely incidental. No stenosis. L3-4:  Mild bulging of the disc.  No stenosis. L4-5: Mild bulging of the disc. Mild facet hypertrophy. No stenosis. Facet arthritis could be associated with low back pain. L5-S1: Minimal bulging of the disc. Mild facet degeneration. No stenosis. Facet arthritis could be associated with mild low back pain. Sacroiliac joints appear unremarkable. IMPRESSION: Old healed minor superior endplate fracture anteriorly at T12. No recent fracture. Marrow pattern is somewhat heterogeneous, but is one of the patterns of normal. There are no enhancing lesions or destructive lesions. No statement can be made about the presence of myeloma cells given this pattern. No stenosis or neural compression. Mild age related desiccation and bulging of the discs. Mild facet osteoarthritis at L4-5 and L5-S1, which could be associated with low back pain. Electronically Signed   By: MNelson ChimesM.D.   On: 02/27/2016 09:48    Microbiology: Recent Results (from the past 240 hour(s))  TECHNOLOGIST REVIEW     Status: None   Collection Time: 02/21/16  9:31 AM   Result Value Ref Range Status   Technologist Review Metas and Myelocytes present  Final  Culture, blood (routine x 2)     Status: None (Preliminary result)   Collection Time: 02/26/16  7:30 PM  Result Value Ref Range Status   Specimen Description BLOOD BLOOD RIGHT FOREARM  Final   Special Requests BOTTLES DRAWN AEROBIC AND ANAEROBIC 5CC  Final   Culture   Final    NO GROWTH 2 DAYS [facility]   Report Status PENDING  Incomplete  Urine culture  Status: Abnormal   Collection Time: 02/26/16  8:39 PM  Result Value Ref Range Status   Specimen Description URINE, CLEAN CATCH  Final   Special Requests NONE  Final   Culture (A)  Final    1,000 COLONIES/mL INSIGNIFICANT GROWTH Performed at [facility]    Report Status 02/28/2016 FINAL  Final  Culture, blood (routine x 2)     Status: None (Preliminary result)   Collection Time: 02/27/16 12:23 AM  Result Value Ref Range Status   Specimen Description BLOOD LEFT ARM  Final   Special Requests BOTTLES DRAWN AEROBIC AND ANAEROBIC 10CC  Final   Culture   Final    NO GROWTH 2 DAYS Performed at [facility]    Report Status PENDING  Incomplete     Labs: CBC:  Recent Labs Lab 02/26/16 1923 02/27/16 0355 02/27/16 1840 02/28/16 0318 02/29/16 0326  WBC 4.9 3.8* 4.1 3.3* 4.5  NEUTROABS 2.9  --  2.2 1.5* 1.9  HGB 10.7* 10.5* 10.2* 10.2* 10.7*  HCT 31.4* 31.2* 29.6* 30.7* 31.6*  MCV 88.5 90.2 87.6 90.3 87.8  PLT 174 157 142* 140* 761*   Basic Metabolic Panel:  Recent Labs Lab 02/26/16 1923 02/27/16 0355 02/28/16 0318  NA 136 136 137  K 3.8 3.7 4.0  CL 102 105 105  CO2 26 25 28   GLUCOSE 98 90 100*  BUN 14 11 11   CREATININE 0.96 0.89 0.74  CALCIUM 9.1 8.6* 9.1  MG  --  1.7 2.2  PHOS  --  2.7  --    Liver Function Tests:  Recent Labs Lab 02/26/16 1923 02/27/16 0355 02/28/16 0318  AST 34 29 30  ALT 20 19 19   ALKPHOS 61 56 58  BILITOT 0.4 0.5 0.5  PROT 6.6 6.0* 5.8*  ALBUMIN  3.5 3.2* 3.1*   No results for input(s): LIPASE, AMYLASE in the last 168 hours. No results for input(s): AMMONIA in the last 168 hours. Cardiac Enzymes:  Recent Labs Lab 02/26/16 1923  TROPONINI <0.03   BNP (last 3 results)  Recent Labs  12/19/15 2143  BNP 103.5*   CBG: No results for input(s): GLUCAP in the last 168 hours.  SignedBerle Mull  [facility]  02/29/2016  , 6:10 PM

## 2016-02-29 NOTE — Progress Notes (Signed)
Occupational Therapy Treatment Patient Details Name: Andre [name] MRN: [id number] DOB: [birth year] Today's Date: 02/29/2016    History of present illness 65 yo male admitted with anemia, multiple falls, back pain. Hx of DM, OA, back surgery, T12 comp fx, HTN, multiple myeloma-s/p stem cell transplant 02/04/16.    OT comments  Patient progressing slowly towards OT goals. He still presents with decreased memory, problem solving, and decreased safety/judgment. Recommend SNF rehab at discharge.   Follow Up Recommendations  SNF    Equipment Recommendations  3 in 1 bedside comode    Recommendations for Other Services      Precautions / Restrictions Precautions Precautions: Fall Restrictions Weight Bearing Restrictions: No       Mobility Bed Mobility Overal bed mobility: Needs Assistance Bed Mobility: Supine to Sit;Sit to Supine     Supine to sit: Supervision Sit to supine: Min assist   General bed mobility comments: assist for LEs  Transfers Overall transfer level: Needs assistance Equipment used: Rolling walker (2 wheeled) Transfers: Sit to/from Stand Sit to Stand: Min guard;Min assist         General transfer comment: vc's safety, technique, hand placement. Occasional assist to rise and control descent    Balance                                   ADL Overall ADL's : Needs assistance/impaired     Grooming: Wash/dry hands;Wash/dry face;Oral care;Set up;Sitting                               Functional mobility during ADLs: Minimal assistance;Min guard;Rolling walker General ADL Comments: Sat EOB to participate in grooming activity. Then worked on sit to stand to [employer] and standing balance in prep for standing ADLs. Patient tolerated well. Wife present.      Vision                     Perception     Praxis      Cognition   Behavior During Therapy: [facility] for tasks assessed/performed Overall Cognitive Status: Within  Functional Limits for tasks assessed Area of Impairment: Problem solving     Memory: Decreased short-term memory        Problem Solving: Requires tactile cues;Requires verbal cues General Comments: pt did not remember working with me yesterday    Extremity/Trunk Assessment               Exercises     Shoulder Instructions       General Comments      Pertinent Vitals/ Pain       Pain Assessment: 0-10 Pain Score: 4  Faces Pain Scale: Hurts little more Pain Location: low back Pain Descriptors / Indicators: Sore;Tightness Pain Intervention(s): Limited activity within patient's tolerance;Monitored during session;Repositioned  Home Living                                          Prior Functioning/Environment              Frequency Min 2X/week     Progress Toward Goals  OT Goals(current goals can now be found in the care plan section)  Progress towards OT goals: Progressing toward goals  Acute Rehab OT Goals Patient Stated  Goal: rehab then home  Plan Discharge plan remains appropriate    Co-evaluation                 End of Session Equipment Utilized During Treatment: Rolling walker   Activity Tolerance Patient tolerated treatment well   Patient Left in bed;with call bell/phone within reach;with family/visitor present   Nurse Communication      Functional Assessment Tool Used: clinical judgment Functional Limitation: Self care Self Care Current Status (U2025): At least 40 percent but less than 60 percent impaired, limited or restricted Self Care Goal Status (K2706): At least 1 percent but less than 20 percent impaired, limited or restricted   Time: 1350-1425 OT Time Calculation (min): 35 min  Charges: OT G-codes **NOT FOR INPATIENT CLASS** Functional Assessment Tool Used: clinical judgment Functional Limitation: Self care Self Care Current Status (C3762): At least 40 percent but less than 60 percent impaired, limited or  restricted Self Care Goal Status (G3151): At least 1 percent but less than 20 percent impaired, limited or restricted OT General Charges $OT Visit: 1 Procedure OT Treatments $Self Care/Home Management : 8-22 mins $Therapeutic Activity: 8-22 mins  Travonne Schowalter A 02/29/2016, 2:32 PM

## 2016-02-29 NOTE — Telephone Encounter (Signed)
Faxed pt labs to [facility] [phone]

## 2016-02-29 NOTE — Progress Notes (Signed)
Pharmacy: Levaquin  Patient's a 65 y.o M known to pharmacy from cefepime and vancomycin consult for febrile neutropenia.  To change abx to levaquin PO for 2 more days per MD's request.  -scr 0.74 (crcl~90)  Plan: - levaquin 750 mg PO q24h x2 days - pharmacy will sign off - re-consult [place] if need further assistance  Thank you for asking pharmacy to participate in this patient's care.  Dia Sitter, PharmD, BCPS 02/29/2016 5:38 PM

## 2016-02-29 NOTE — Progress Notes (Signed)
[facility] Progress Note  Patient: Andre [name] ZHY:[id number]   PCP: [name], MD DOB: Feb 15, 1951   DOA: 02/26/2016   DOS: 02/29/2016   Date of Service: the patient was seen and examined on 02/29/2016  Subjective: Patient is mentating better this morning. Rash is also improving. No nausea no vomiting or shortness of breath. No abdominal pain. Patient continues to complain of mild right flank pain. Nutrition: Tolerating oral diet  Brief hospital course: Patient was admitted on 02/26/2016, with complaint of fall and back pain, was found to have fever with low WBC. Currently further plan is continue IV antibiotics.  Assessment and Plan: 1. Multiple myeloma ([place]) Initial autologous transplant in 2012, recent autologous stem cell rescue 02/04/2016 granix was given for WBC recovery. Treated for febrile neutropenia as well as diarrhea with negative workup and discharged on 02/18/2016. Patient has been complaining of back pain ever since and presented with a fall and generalized weakness. At present due to decreasing WBC as well as low-grade temperature the patient will be started on cefepime and vancomycin to cover him for possible infection.Blood cultures negative for 48 hours. No other acute infection identified. We will transition to oral Levaquin and finish a total 5 day treatment course.  2. Papular rash on the chest. Improving Blanchable non-itching. Located around the tape which has been removed. Possibility of allergic reaction. We will use Benadryl and Pepcid. Add hydrocortisone ointment.  3. Acute encephalopathy. Resolved Patient has mild confusion at present, at his baseline the patient has some confusion as per wife but it is more profound. Possibility is due to pain medications as well as Flexeril. We will continue to closely monitor the patient at present.  4. Hypothyroidism. Stable TSH free T. Continue Synthroid  5. Back pain. Improving Patient's primary  reason for admission. Examination does not reveal any neurological component. MRI spine was also unremarkable. We will continue to closely monitor.  6. Orthostatic hypotension. Generalized weakness. Improved with IV hydration. We'll continue with IV hydration overnight and encourage oral fluid  Pain management: OxyIR from Percocet due to Tylenol component. Continue when necessary Tylenol, continue when necessary Norco. Added schedule Flexeril Activity: consulted physical therapy Bowel regimen: last BM 02/26/2016, stool softeners added Diet: Regular diet DVT Prophylaxis: subcutaneous Heparin  Advance goals of care discussion: Full code  Family Communication: family was present at bedside, at the time of interview. The pt provided permission to discuss medical plan with the family. Opportunity was given to ask question and all questions were answered satisfactorily.   Disposition:  Discharge to SNF Expected discharge date: 03/01/2016  Consultants: Medical oncology Procedures: None  Antibiotics: Anti-infectives    Start     Dose/Rate Route Frequency Ordered Stop   02/28/16 0800  vancomycin (VANCOCIN) IVPB 1000 mg/200 mL premix  Status:  Discontinued     1,000 mg 200 mL/hr over 60 Minutes Intravenous Every 12 hours 02/27/16 1856 02/29/16 1215   02/27/16 1900  ceFEPIme (MAXIPIME) 2 g in dextrose 5 % 50 mL IVPB  Status:  Discontinued     2 g 100 mL/hr over 30 Minutes Intravenous Every 8 hours 02/27/16 1826 02/29/16 1707   02/27/16 1900  vancomycin (VANCOCIN) IVPB 1000 mg/200 mL premix     1,000 mg 200 mL/hr over 60 Minutes Intravenous STAT 02/27/16 1856 02/27/16 2014   02/27/16 1000  valACYclovir (VALTREX) tablet 500 mg     500 mg Oral Daily 02/26/16 2354          Intake/Output Summary (  Last 24 hours) at 02/29/16 1719 Last data filed at 02/29/16 0841  Gross per 24 hour  Intake    240 ml  Output    840 ml  Net   -600 ml   Filed Weights   02/27/16 0012  Weight: 82.963 kg  (182 lb 14.4 oz)    Objective: Physical Exam: Filed Vitals:   02/28/16 0520 02/28/16 1519 02/28/16 2145 02/29/16 0633  BP: 146/80 133/91 140/92 131/82  Pulse: 81 91 100 88  Temp: 98.6 F (37 C) 98.2 F (36.8 C) 98.5 F (36.9 C) 98.6 F (37 C)  TempSrc: Oral Oral Oral Oral  Resp: 18 18 18 18   Height:      Weight:      SpO2: 97% 95% 100% 97%    General: Alert, Awake and Oriented to Time, Place and Person. Appear in mild distress Eyes: PERRL, Conjunctiva normal ENT: Oral Mucosa clear moist. Neck: no JVD, no Abnormal Mass Or lumps Cardiovascular: S1 and S2 Present, no Murmur, Peripheral Pulses Present Respiratory: Bilateral Air entry equal and Decreased, Clear to Auscultation, no Crackles, no wheezes Abdomen: Bowel Sound present, Soft and no tenderness Skin: no redness, maculopapular Rash in the right chest Extremities: no Pedal edema, no calf tenderness Neurologic: Grossly no focal neuro deficit. Bilaterally Equal motor strength  Data Reviewed: CBC:  Recent Labs Lab 02/26/16 1923 02/27/16 0355 02/27/16 1840 02/28/16 0318 02/29/16 0326  WBC 4.9 3.8* 4.1 3.3* 4.5  NEUTROABS 2.9  --  2.2 1.5* 1.9  HGB 10.7* 10.5* 10.2* 10.2* 10.7*  HCT 31.4* 31.2* 29.6* 30.7* 31.6*  MCV 88.5 90.2 87.6 90.3 87.8  PLT 174 157 142* 140* 209*   Basic Metabolic Panel:  Recent Labs Lab 02/26/16 1923 02/27/16 0355 02/28/16 0318  NA 136 136 137  K 3.8 3.7 4.0  CL 102 105 105  CO2 26 25 28   GLUCOSE 98 90 100*  BUN 14 11 11   CREATININE 0.96 0.89 0.74  CALCIUM 9.1 8.6* 9.1  MG  --  1.7 2.2  PHOS  --  2.7  --     Liver Function Tests:  Recent Labs Lab 02/26/16 1923 02/27/16 0355 02/28/16 0318  AST 34 29 30  ALT 20 19 19   ALKPHOS 61 56 58  BILITOT 0.4 0.5 0.5  PROT 6.6 6.0* 5.8*  ALBUMIN 3.5 3.2* 3.1*   No results for input(s): LIPASE, AMYLASE in the last 168 hours. No results for input(s): AMMONIA in the last 168 hours. Coagulation Profile:  Recent Labs Lab  02/27/16 1840  INR 1.07   Cardiac Enzymes:  Recent Labs Lab 02/26/16 1923  TROPONINI <0.03   BNP (last 3 results) No results for input(s): PROBNP in the last 8760 hours.  CBG: No results for input(s): GLUCAP in the last 168 hours.  Studies: No results found.   Scheduled Meds: . aspirin  325 mg Oral Daily  . cyclobenzaprine  5 mg Oral TID  . escitalopram  10 mg Oral Daily  . famotidine  20 mg Oral BID  . feeding supplement (ENSURE ENLIVE)  237 mL Oral BID BM  . hydrocortisone cream   Topical TID  . levothyroxine  200 mcg Oral QAC breakfast  . loratadine  10 mg Oral Daily  . polyethylene glycol  17 g Oral Daily  . valACYclovir  500 mg Oral Daily   Continuous Infusions: . sodium chloride     PRN Meds: acetaminophen **OR** acetaminophen, albuterol, bisacodyl, diphenhydrAMINE, docusate sodium, HYDROcodone-acetaminophen, LORazepam, ondansetron **OR** ondansetron (ZOFRAN)  IV, oxyCODONE, pantoprazole, prochlorperazine, zolpidem  Time spent: 30 minutes  Author: Berle Mull, MD [facility] Pager: [phone] 02/29/2016 5:19 PM  If 7PM-7AM, please contact night-coverage at [email or web address], password [facility]

## 2016-02-29 NOTE — Progress Notes (Signed)
Andre [name]   DOB:1950-09-28   YB#:[id number]   NID#:[id number]  Subjective: back pain is "pretty much gone." Constipated--not unusual for him. Not getting OOB much--"no one to walk with." Wife not in room   Objective: middle aged White man exmained in ebd Filed Vitals:   02/28/16 2145 02/29/16 0633  BP: 140/92 131/82  Pulse: 100 88  Temp: 98.5 F (36.9 C) 98.6 F (37 C)  Resp: 18 18    Body mass index is 27 kg/(m^2).  Intake/Output Summary (Last 24 hours) at 02/29/16 0840 Last data filed at 02/29/16 0604  Gross per 24 hour  Intake    360 ml  Output    790 ml  Net   -430 ml     Sclerae unicteric  Oropharynx clear  No peripheral adenopathy  Lungs clear -- no rales or rhonchi  Heart regular rate and rhythm  Abdomen benign  Neuro nonfocal, oriented x3   CBG (last 3)  No results for input(s): GLUCAP in the last 72 hours.   Labs:  Lab Results  Component Value Date   WBC 4.5 02/29/2016   HGB 10.7* 02/29/2016   HCT 31.6* 02/29/2016   MCV 87.8 02/29/2016   PLT 145* 02/29/2016   NEUTROABS 1.9 02/29/2016    _0 @  Urine Studies No results for input(s): UHGB, CRYS in the last 72 hours.  Invalid input(s): UACOL, UAPR, USPG, UPH, UTP, UGL, UKET, UBIL, UNIT, UROB, ULEU, UEPI, UWBC, URBC, UBAC, CAST, UCOM, BILUA  Basic Metabolic Panel:  Recent Labs Lab 02/26/16 1923 02/27/16 0355 02/28/16 0318  NA 136 136 137  K 3.8 3.7 4.0  CL 102 105 105  CO2 _1 GLUCOSE 98 90 100*  BUN _2 CREATININE 0.96 0.89 0.74  CALCIUM 9.1 8.6* 9.1  MG  --  1.7 2.2  PHOS  --  2.7  --    GFR Estimated Creatinine Clearance: 90.8 mL/min (by C-G formula based on Cr of 0.74). Liver Function Tests:  Recent Labs Lab 02/26/16 1923 02/27/16 0355 02/28/16 0318  AST 34 29 30  ALT _3 ALKPHOS 61 56 58  BILITOT 0.4 0.5 0.5  PROT 6.6 6.0* 5.8*  ALBUMIN 3.5 3.2* 3.1*   No results for input(s): LIPASE, AMYLASE in the last 168 hours. No results for  input(s): AMMONIA in the last 168 hours. Coagulation profile  Recent Labs Lab 02/27/16 1840  INR 1.07    CBC:  Recent Labs Lab 02/26/16 1923 02/27/16 0355 02/27/16 1840 02/28/16 0318 02/29/16 0326  WBC 4.9 3.8* 4.1 3.3* 4.5  NEUTROABS 2.9  --  2.2 1.5* 1.9  HGB 10.7* 10.5* 10.2* 10.2* 10.7*  HCT 31.4* 31.2* 29.6* 30.7* 31.6*  MCV 88.5 90.2 87.6 90.3 87.8  PLT 174 157 142* 140* 145*   Cardiac Enzymes:  Recent Labs Lab 02/26/16 1923  TROPONINI <0.03   BNP: Invalid input(s): POCBNP CBG: No results for input(s): GLUCAP in the last 168 hours. D-Dimer No results for input(s): DDIMER in the last 72 hours. Hgb A1c No results for input(s): HGBA1C in the last 72 hours. Lipid Profile No results for input(s): CHOL, HDL, LDLCALC, TRIG, CHOLHDL, LDLDIRECT in the last 72 hours. Thyroid function studies  Recent Labs  02/26/16 1924 02/27/16 0027  TSH 6.500*  --   T3FREE  --  2.1   Anemia work up No results for input(s): VITAMINB12, FOLATE, FERRITIN, TIBC, IRON, RETICCTPCT in the last 72 hours. Microbiology Recent Results (from the past  240 hour(s))  TECHNOLOGIST REVIEW     Status: None   Collection Time: 02/21/16  9:31 AM  Result Value Ref Range Status   Technologist Review Metas and Myelocytes present  Final  Culture, blood (routine x 2)     Status: None (Preliminary result)   Collection Time: 02/26/16  7:30 PM  Result Value Ref Range Status   Specimen Description BLOOD BLOOD RIGHT FOREARM  Final   Special Requests BOTTLES DRAWN AEROBIC AND ANAEROBIC 5CC  Final   Culture   Final    NO GROWTH 1 DAY Performed at [facility]    Report Status PENDING  Incomplete  Urine culture     Status: Abnormal   Collection Time: 02/26/16  8:39 PM  Result Value Ref Range Status   Specimen Description URINE, CLEAN CATCH  Final   Special Requests NONE  Final   Culture (A)  Final    1,000 COLONIES/mL INSIGNIFICANT GROWTH Performed at [facility]    Report  Status 02/28/2016 FINAL  Final  Culture, blood (routine x 2)     Status: None (Preliminary result)   Collection Time: 02/27/16 12:23 AM  Result Value Ref Range Status   Specimen Description BLOOD LEFT ARM  Final   Special Requests BOTTLES DRAWN AEROBIC AND ANAEROBIC 10CC  Final   Culture   Final    NO GROWTH 1 DAY Performed at [facility]    Report Status PENDING  Incomplete      Studies:  Mr Lumbar Spine W Wo Contrast  02/27/2016  CLINICAL DATA:  Back pain right worse than left. History of multiple myeloma. Low-grade fever. Fatigue. Recent falls. EXAM: MRI LUMBAR SPINE WITHOUT AND WITH CONTRAST TECHNIQUE: Multiplanar and multiecho pulse sequences of the lumbar spine were obtained without and with intravenous contrast. CONTRAST:  35m MULTIHANCE GADOBENATE DIMEGLUMINE 529 MG/ML IV SOLN COMPARISON:  Radiography 02/26/2016. Abdominal CT 11/07/2015. Head CT 04/01/2013. FINDINGS: Segmentation:  5 lumbar type vertebral bodies. Alignment:  Curvature convex to the left with the apex at L2-3. Vertebrae: Old superior endplate fracture anteriorly at T12 with loss of height of 20%. Completely healed without residual edema. No evidence of recent fracture. Marrow pattern is somewhat heterogeneous which can be normal. There are no enhancing or destructive marrow space lesions in the region image to. Conus medullaris: Extends to the T12 level and appears normal. Paraspinal and other soft tissues: No abnormal finding. Disc levels: L1-2: Normal interspace. L2-3: Small central disc herniation indents the thecal sac slightly but does not affect the neural structures. Likely incidental. No stenosis. L3-4:  Mild bulging of the disc.  No stenosis. L4-5: Mild bulging of the disc. Mild facet hypertrophy. No stenosis. Facet arthritis could be associated with low back pain. L5-S1: Minimal bulging of the disc. Mild facet degeneration. No stenosis. Facet arthritis could be associated with mild low back pain. Sacroiliac  joints appear unremarkable. IMPRESSION: Old healed minor superior endplate fracture anteriorly at T12. No recent fracture. Marrow pattern is somewhat heterogeneous, but is one of the patterns of normal. There are no enhancing lesions or destructive lesions. No statement can be made about the presence of myeloma cells given this pattern. No stenosis or neural compression. Mild age related desiccation and bulging of the discs. Mild facet osteoarthritis at L4-5 and L5-S1, which could be associated with low back pain. Electronically Signed   By: MNelson ChimesM.D.   On: 02/27/2016 09:48    Assessment: 65y.o. [place] man with a history  of multiple myeloma diagnosed February of 2012, with an initial M-spike of 2.6 g/dL, IFE showing an IgG lambda paraprotein and free lambda light chains in the urine. Cytogenetics showed trisomy 61. Bone marrow biopsy showed a 22% plasmacytosis. Treated with   (1) bortezomib (subcutaneously), lenalidomide, and dexamethasone, with repeat bone marrow biopsy May of 2012 showing 10% plasmacytosis   (2) high-dose chemotherapy with BCNU and melphalan at [facility], followed by stem cell rescue July of 2012   (3) on zoledronic acid started December of 2012, initially monthly, currently given every 3 months, most recent dose 12/07/2015  (4) low-dose lenalidomide resumed April 2013, interrupted several times. Resumed again on 02/19/2013, ata dose of 5 mg daily, 21 days on and 7 days off, later further reduced to 7 days on, 7 days off  (5) CNS symptoms and abnormal brain MRI extensively evaluated by neurology with no definitive diagnosis established  (6) rising M spike noted June 2014 but did not meet criteria for carfilzomib study  (7) on lenalidomide 25 mg daily, 14 days on, 7 days off, starting 04/18/2013, interrupted December 2014 because of rash;  (a) resumed January 2015 at 10 mg/ day at 21 days on/ 7 days off ([place]) starting 08/18/2014 decreased to 10 mg/  day 14 days on, 7 days off because of cytopenias (c) as of February 2016 was on 5 mg daily 7 days on 7 days off (d) lenalidomide discontinued December 2016 with evidence of disease progression  (8) transient global amnesia 05/29/2015, resolved without intervention  (9) starting PVD 10/25/2015 w ASA 325 thromboprophylaxis, valacyclovir prophylaxis, last dose 12/17/2015 (a) pomalidomide 4 mg/d days 1-14 ([place]) bortezomib sQ days 2,5,9,12 of each 21 day cycle (c) dexamethasome 20 mg two days a week  (10) metapneumovirus pneumonia April 2017 (a) completing course of steroids and week of bactrim mid April 2017  (11) status post second autologous transplant at [facility] 02/04/2016(preparatory regimen melphalan 200 mg/m)     Plan: [name] seems to have recovered from his "slump," however he has not been getting OOB much this admission. Cultures remain negative. Back pain is improved.  If he is discharged today, as anticipated, he is aware of appt in our office 6/20 at 10:30 AM for labs and labs and visit the following week  Please let me know if I can be of further help.   Chauncey Cruel, MD 02/29/2016  8:40 AM Medical Oncology and Hematology [facility] [address] [place], [place] [address] Tel. [phone]    Fax. [phone]

## 2016-02-29 NOTE — Progress Notes (Signed)
Physical Therapy Treatment Patient Details Name: Andre [name] MRN: [id number] DOB: [birth year] Today's Date: 02/29/2016    History of Present Illness 65 yo male admitted with anemia, multiple falls, back pain. Hx of DM, OA, back surgery, T12 comp fx, HTN, multiple myeloma-s/p stem cell transplant 02/04/16.     PT Comments    Pt receiving 2 bolus IV fluids so assisted OOB to amb while monitoring BP's.  Supine  115/73   HR 81 EOB     116/69   HR 92 Standing   94/51    HR 105 after AMB  105/77  HR 102  Mild c/o feeling "woozy" Pt still demonstrates impaired judgement/memory.  He did not remember walking with me just yesterday.  Unsteady gait.  Pt was too clumsy with the walker, so amb + 2 hand held assist.  HIGH FALL RISK. Pt will need ST Rehab at SNF prior to safely returning home.   Follow Up Recommendations  SNF     Equipment Recommendations       Recommendations for Other Services       Precautions / Restrictions Precautions Precautions: Fall Restrictions Weight Bearing Restrictions: No    Mobility  Bed Mobility Overal bed mobility: Needs Assistance Bed Mobility: Supine to Sit;Sit to Supine     Supine to sit: Supervision Sit to supine: Min assist   General bed mobility comments: Assist for trunk and bil LEs. Increased time. VCs for technique.   Transfers Overall transfer level: Needs assistance Equipment used: None Transfers: Sit to/from Stand Sit to Stand: From elevated surface;Min assist         General transfer comment: Assist to rise, stabilize, control descent. VCs safety, technique, hand placement.   Ambulation/Gait Ambulation/Gait assistance: Min assist Ambulation Distance (Feet): 122 Feet Assistive device: None Gait Pattern/deviations: Step-through pattern;Decreased stride length Gait velocity: decreased   General Gait Details: pt was too clumbsy using walker so amb + 2 hand held assist.  Unsteady gait.  Limited endurance.  HIGH FALL  RISK   Stairs            Wheelchair Mobility    Modified Rankin (Stroke Patients Only)       Balance                                    Cognition Arousal/Alertness: Awake/alert Behavior During Therapy: WFL for tasks assessed/performed Overall Cognitive Status: Within Functional Limits for tasks assessed Area of Impairment: Problem solving     Memory: Decreased short-term memory         General Comments: pt didn't remember walking with me yesterday    Exercises      General Comments        Pertinent Vitals/Pain Pain Assessment: Faces Faces Pain Scale: Hurts little more Pain Location: lower back Pain Descriptors / Indicators: Aching;Discomfort;Grimacing Pain Intervention(s): Monitored during session;Repositioned    Home Living                      Prior Function            PT Goals (current goals can now be found in the care plan section) Progress towards PT goals: Progressing toward goals    Frequency  Min 3X/week    PT Plan Current plan remains appropriate    Co-evaluation             End of Session Equipment Utilized  During Treatment: Gait belt Activity Tolerance: Patient limited by fatigue Patient left: in bed;with call bell/phone within reach     Time: 9833-8250 PT Time Calculation (min) (ACUTE ONLY): 27 min  Charges:  $Gait Training: 8-22 mins $Therapeutic Activity: 8-22 mins                    G Codes:      Andre [name]  PTA [facility]  Acute  Rehab Pager      [phone]

## 2016-02-29 NOTE — Progress Notes (Signed)
CSW assisting with d/c planning. Pt / spouse have accepted ST Rehab bed at [facility]. SNF has received authorization from [facility] insurance for admission tomorrow if stable for d/c. CSW will continue to follow to assist with d/c planning needs.  Werner Lean LCSW [phone]

## 2016-03-01 MED ORDER — LORAZEPAM 1 MG PO TABS: 0.5000 mg | ORAL_TABLET | Freq: Four times a day (QID) | ORAL | Status: DC | PRN

## 2016-03-01 MED ORDER — OXYCODONE HCL 5 MG PO TABS: 5.0000 mg | ORAL_TABLET | Freq: Four times a day (QID) | ORAL | Status: DC | PRN

## 2016-03-01 NOTE — Clinical Social Work Placement (Signed)
   CLINICAL SOCIAL WORK PLACEMENT  NOTE  Date:  03/01/2016  Patient Details  Name: Andre [name] MRN: [id number] Date of Birth: 09-16-1951  Clinical Social Work is seeking post-discharge placement for this patient at the [place] level of care (*CSW will initial, date and re-position this form in  chart as items are completed):  Yes   Patient/family provided with [facility] Work Department's list of facilities offering this level of care within the geographic area requested by the patient (or if unable, by the patient's family).  Yes   Patient/family informed of their freedom to choose among providers that offer the needed level of care, that participate in Medicare, Medicaid or managed care program needed by the patient, have an available bed and are willing to accept the patient.  Yes   Patient/family informed of [facility]'s ownership interest in [facility] and [facility], as well as of the fact that they are under no obligation to receive care at these facilities.  PASRR submitted to EDS on 02/28/16     PASRR number received on 02/28/16     Existing PASRR number confirmed on       FL2 transmitted to all facilities in geographic area requested by pt/family on 02/28/16     FL2 transmitted to all facilities within larger geographic area on       Patient informed that his/her managed care company has contracts with or will negotiate with certain facilities, including the following:        Yes   Patient/family informed of bed offers received.  Patient chooses bed at [facility]     Physician recommends and patient chooses bed at      Patient to be transferred to [facility] on 03/01/16.  Patient to be transferred to facility by [facility]     Patient family notified on 03/01/16 of transfer.  Name of family member notified:  SPOUSE     PHYSICIAN Please sign FL2     Additional Comment: Pt / spouse are  in agreement with d/c to [facility] [place] today. Pt requesting to transport by car. D/C summary sent to SNF for review. Scripts included in d/c packet. # for report provided to nsg. D/C packet provided to pt.   _______________________________________________ Luretha Rued, [place]  [phone] 03/01/2016, 1:27 PM

## 2016-03-01 NOTE — Progress Notes (Signed)
Report called to [facility] at [facility] that will receive the patient.  Patient stable for discharge.

## 2016-03-01 NOTE — Progress Notes (Signed)
Nursing Discharge Summary  Patient ID: MATTHER DOUPE MRN: [id number] DOB/AGE: Apr 05, 1951 65 y.o.  Admit date: 02/26/2016 Discharge date: 03/01/2016  Discharged Condition: good  Disposition: 01-Home or Self Care  Follow-up Information    Follow up with [facility] SNF.   Specialty:  [place] information:   [place] [phone]      Follow up with [name], MD. Schedule an appointment as soon as possible for a visit in 1 week.   Specialty:  Internal Medicine   Contact information:   [place] [address] [phone]       Follow up with Chauncey Cruel, MD. Go on 03/07/2016.   Specialty:  Oncology   Why:  for blood work   Contact information:   [place] [address] [phone]       Prescriptions Given: Packet sent with wife and patient for his admission to [place]. Patient stable from AM assessment.  Means of Discharge: Patient to be taken downstairs via wheelchair to be discharged to [facility] via private vehicle.   Signed: [place], [place] 03/01/2016, 12:21 PM

## 2016-03-01 NOTE — Progress Notes (Signed)
Pt has orthostatic vitals ordered for this am. Tech was able to get them lying and sitting, but dinamap would not register bp while standing.  Would have taken the standing bp manually, but patient had been standing up too long and wanted to sit back down.  Will let day shift know.

## 2016-03-02 ENCOUNTER — Telehealth: Payer: Self-pay | Admitting: *Deleted

## 2016-03-02 NOTE — Telephone Encounter (Signed)
Pt was on TCM list admitted for multiple myeloma was d/c 03/01/16, and sent to SNF.../lmb 

## 2016-03-03 LAB — CULTURE, BLOOD (ROUTINE X 2)
CULTURE: NO GROWTH
CULTURE: NO GROWTH

## 2016-03-07 ENCOUNTER — Other Ambulatory Visit (HOSPITAL_BASED_OUTPATIENT_CLINIC_OR_DEPARTMENT_OTHER): Payer: Commercial Managed Care - PPO

## 2016-03-07 DIAGNOSIS — C9000 Multiple myeloma not having achieved remission: Secondary | ICD-10-CM

## 2016-03-07 LAB — CBC WITH DIFFERENTIAL/PLATELET
BASO%: 1.2 % (ref 0.0–2.0)
Basophils Absolute: 0.1 10*3/uL (ref 0.0–0.1)
EOS%: 16.4 % — AB (ref 0.0–7.0)
Eosinophils Absolute: 1.2 10*3/uL — ABNORMAL HIGH (ref 0.0–0.5)
HCT: 37.9 % — ABNORMAL LOW (ref 38.4–49.9)
HGB: 12.5 g/dL — ABNORMAL LOW (ref 13.0–17.1)
LYMPH%: 27.5 % (ref 14.0–49.0)
MCH: 30.5 pg (ref 27.2–33.4)
MCHC: 33 g/dL (ref 32.0–36.0)
MCV: 92.6 fL (ref 79.3–98.0)
MONO#: 1.6 10*3/uL — ABNORMAL HIGH (ref 0.1–0.9)
MONO%: 21.6 % — ABNORMAL HIGH (ref 0.0–14.0)
NEUT#: 2.4 10*3/uL (ref 1.5–6.5)
NEUT%: 33.3 % — AB (ref 39.0–75.0)
PLATELETS: 198 10*3/uL (ref 140–400)
RBC: 4.1 10*6/uL — AB (ref 4.20–5.82)
RDW: 21.8 % — ABNORMAL HIGH (ref 11.0–14.6)
WBC: 7.2 10*3/uL (ref 4.0–10.3)
lymph#: 2 10*3/uL (ref 0.9–3.3)

## 2016-03-07 LAB — COMPREHENSIVE METABOLIC PANEL
ALBUMIN: 3.6 g/dL (ref 3.5–5.0)
ALK PHOS: 67 U/L (ref 40–150)
ALT: 16 U/L (ref 0–55)
AST: 27 U/L (ref 5–34)
Anion Gap: 9 mEq/L (ref 3–11)
BUN: 15 mg/dL (ref 7.0–26.0)
CALCIUM: 10.9 mg/dL — AB (ref 8.4–10.4)
CO2: 24 mEq/L (ref 22–29)
CREATININE: 1.1 mg/dL (ref 0.7–1.3)
Chloride: 106 mEq/L (ref 98–109)
EGFR: 71 mL/min/{1.73_m2} — ABNORMAL LOW (ref >90)
GLUCOSE: 90 mg/dL (ref 70–140)
Potassium: 4.2 mEq/L (ref 3.5–5.1)
Sodium: 139 mEq/L (ref 136–145)
Total Bilirubin: 0.36 mg/dL (ref 0.20–1.20)
Total Protein: 7.5 g/dL (ref 6.4–8.3)

## 2016-03-14 ENCOUNTER — Ambulatory Visit (HOSPITAL_BASED_OUTPATIENT_CLINIC_OR_DEPARTMENT_OTHER): Payer: Commercial Managed Care - PPO | Admitting: Oncology

## 2016-03-14 ENCOUNTER — Other Ambulatory Visit (HOSPITAL_BASED_OUTPATIENT_CLINIC_OR_DEPARTMENT_OTHER): Payer: Commercial Managed Care - PPO

## 2016-03-14 ENCOUNTER — Telehealth: Payer: Self-pay | Admitting: Oncology

## 2016-03-14 VITALS — BP 125/95 | HR 100 | Temp 98.0°F | Resp 18 | Ht 69.0 in | Wt 178.5 lb

## 2016-03-14 DIAGNOSIS — C9000 Multiple myeloma not having achieved remission: Secondary | ICD-10-CM

## 2016-03-14 DIAGNOSIS — Z9481 Bone marrow transplant status: Secondary | ICD-10-CM

## 2016-03-14 DIAGNOSIS — D649 Anemia, unspecified: Secondary | ICD-10-CM

## 2016-03-14 DIAGNOSIS — C9002 Multiple myeloma in relapse: Secondary | ICD-10-CM

## 2016-03-14 LAB — COMPREHENSIVE METABOLIC PANEL
ALT: 18 U/L (ref 0–55)
ANION GAP: 8 meq/L (ref 3–11)
AST: 24 U/L (ref 5–34)
Albumin: 3.4 g/dL — ABNORMAL LOW (ref 3.5–5.0)
Alkaline Phosphatase: 68 U/L (ref 40–150)
BUN: 19.6 mg/dL (ref 7.0–26.0)
CHLORIDE: 106 meq/L (ref 98–109)
CO2: 22 meq/L (ref 22–29)
Calcium: 9.7 mg/dL (ref 8.4–10.4)
Creatinine: 1.1 mg/dL (ref 0.7–1.3)
EGFR: 66 mL/min/{1.73_m2} — ABNORMAL LOW (ref >90)
GLUCOSE: 74 mg/dL (ref 70–140)
Potassium: 4.1 mEq/L (ref 3.5–5.1)
SODIUM: 136 meq/L (ref 136–145)
TOTAL PROTEIN: 7.2 g/dL (ref 6.4–8.3)
Total Bilirubin: 0.32 mg/dL (ref 0.20–1.20)

## 2016-03-14 LAB — CBC WITH DIFFERENTIAL/PLATELET
BASO%: 1.1 % (ref 0.0–2.0)
Basophils Absolute: 0.1 10*3/uL (ref 0.0–0.1)
EOS%: 10 % — AB (ref 0.0–7.0)
Eosinophils Absolute: 0.8 10*3/uL — ABNORMAL HIGH (ref 0.0–0.5)
HCT: 35.5 % — ABNORMAL LOW (ref 38.4–49.9)
HGB: 11.6 g/dL — ABNORMAL LOW (ref 13.0–17.1)
LYMPH%: 25.5 % (ref 14.0–49.0)
MCH: 30.6 pg (ref 27.2–33.4)
MCHC: 32.7 g/dL (ref 32.0–36.0)
MCV: 93.8 fL (ref 79.3–98.0)
MONO#: 1.3 10*3/uL — ABNORMAL HIGH (ref 0.1–0.9)
MONO%: 16.8 % — AB (ref 0.0–14.0)
NEUT%: 46.6 % (ref 39.0–75.0)
NEUTROS ABS: 3.6 10*3/uL (ref 1.5–6.5)
PLATELETS: 212 10*3/uL (ref 140–400)
RBC: 3.79 10*6/uL — AB (ref 4.20–5.82)
RDW: 22.1 % — ABNORMAL HIGH (ref 11.0–14.6)
WBC: 7.7 10*3/uL (ref 4.0–10.3)
lymph#: 2 10*3/uL (ref 0.9–3.3)

## 2016-03-14 NOTE — Progress Notes (Signed)
ID: Andre [name]   DOB: 26-Jun-1951  MR#: [id number]  QZR#:[id number]   PCP: [name] SU: OTHER MD: [name], [name], [address] [place]), Ardine Bjork  CHIEF COMPLAINT: Multiple myeloma  CURRENT TREATMENT: Observation, currently day 18 post autologous transplant  HISTORY OF PRESENT ILLNESS: From the original intake nodes:  The patient was worked up for peptic ulcer disease in August of 2011, with significant bleeding and anemia.  The patient was Helicobacter pylori negative.  He received some epinephrine when he had his EGD and then started on Protonix.  [name] anemia slowly resolved so that by September, his hemoglobin was up to 10.9 and by earlier this month, his hemoglobin was up to 12.5.    As part of his general workup, he was found to have a slightly elevated globulin fraction.  In September, his total protein was 8.3 with an albumin of 3.8.  In January, the total protein was 8.4 and albumin 3.6.  With persistence of this slight abnormality, Dr. Olevia Perches obtained serum immunofixation and SPEP.  The SPTP showed an M-spike of 2.67 grams.  A total IgG was 4,190.  Total IgA low at 28.  Total IgM low at 28 also.  The immunofixation showed a monoclonal IgG lambda paraprotein.  There were also monoclonal free lambda light chains present. With this information, the patient was referred for further evaluation.   A diagnosis of myeloma was confirmed by bone marrow biopsy and subsequebnt treatment is as detailed below  INTERVAL HISTORY: Andre [name] returns today for follow-up of his multiple myeloma, accompanied by his wife, Louanne Belton. Today is day 40 from his second transplant. Since the last visit here, he had a brief admission for confusion, then was discharged to a rehabilitation facility. He was they're only 2 days. There were doing a good job on the rehabilitation but they were very concerned that nobody seems to be washing their hands.  At home he is getting home health physical therapy once a week. Unfortunately he is not following up on the other days  On the plus side he is back to work half time now and is able to get through that half day without significant problems although he admits his concentration is not as good as it used to be.  REVIEW OF SYSTEMS: Andre [name] has had no bleeding, fever, or rash--the rash he had previously involving the anterior chest is clear. His bowel movements are now well controlled on Colace which she takes intermittently. He continues to lower back pain but we evaluated that with an MRI which showed no evidence of obvious disease there. He has some heartburn issues. He still a little bit on the forgetful side. Otherwise a detailed review of systems today was stable  PAST MEDICAL HISTORY: Past Medical History  Diagnosis Date  . Hypertension   . Hyperlipidemia   . Duodenal ulcer   . Allergy   . Thyroid disease   . GERD (gastroesophageal reflux disease)   . Depression   . Anemia   . Hypothyroidism   . Asthma     no treatment x 20 years  . Arthritis   . Double vision     occurs at times   . Multiple myeloma 07/04/2011  Significant for peptic ulcer disease as noted above.  History of hyperlipidemia.  History of anxiety and depression.  History of GERD which is significantly improved with weight loss and history of reactive airway disease, possibly secondary to the GERD which also  improved with weight loss.  There was a history of obesity, now much improved secondary to exercise and diet.   PAST SURGICAL HISTORY: Past Surgical History  Procedure Laterality Date  . Cardiolite study  11/25/2003    NORMAL  . Bone marrow transplant  2011    for MM  . Tonsillectomy      FAMILY HISTORY Family History  Problem Relation Age of Onset  . Throat cancer Mother   . Hypertension Father   . Stroke Father   . Asthma Father   . Diabetes Father   . Pancreatic cancer Brother   [name] father  died at the age of 19 following a stroke.  [name] mother died with cancer of the throat at the age of 15.   She had a history of depression and was a smoker.  Not clear how much alcohol she drank, according to the patient.  The patient had two brothers.  One died from the age of 104 from a "kidney problem."  The second one died at the age of 70 from pancreatic cancer. (This may have been duodenal cancer. 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  His wife of 37+ years was [name], but they are now divorced. In 2016 he remarried, his current wife's name is [name].  The patient has two daughters:  [name], who works in [place], who is expecting her second child April 2016. .  They both live here in [place]. 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  The patient is very close to his daughters.  [place] first daughter, [name], born 03/06/2013, apparently has Weber/Osler/Rendu. The patient is not a church attender.  He does derive a great deal of support from friends at the "[employer]" which he attends very regularly, and also participates in Al-Anon even though actually there is no alcohol or drug problem in the family.  He participates in this group and he gets quite a bit of support from it.     ADVANCED DIRECTIVES: In place  HEALTH MAINTENANCE: (updated 07/16/2013) Social History  Substance Use Topics  . Smoking status: Former Smoker -- 3 years    Quit date: 03/27/1969  . Smokeless tobacco: Never Used  . Alcohol Use: No     Colonoscopy:  PSA:  Sept 2014, normal/Dr. [name]  Lipid panel: Sept 2014/Dr. [name]   Allergies  Allergen Reactions  . Atorvastatin Other (See Comments)  . Rosuvastatin     Other reaction(s): Liver Disorder  . Crestor [Rosuvastatin Calcium]      ADVERSE EFFECTS ON LIVER  . Lipitor [Atorvastatin Calcium]   . Septra [Sulfamethoxazole-Trimethoprim] Rash    Current Outpatient Prescriptions  Medication Sig Dispense Refill  . albuterol (PROVENTIL HFA;VENTOLIN HFA) 108 (90 Base) MCG/ACT inhaler Inhale 2 puffs into the lungs every 4 (four) hours as needed for wheezing or shortness of breath. 1 Inhaler 3  . aspirin 325 MG tablet Take 325 mg by mouth daily.    . Bisacodyl (DULCOLAX PO) Take 1 tablet by mouth daily as needed (for constipation). Reported on 12/07/2015    . cetirizine (ZYRTEC) 10 MG tablet Take 10 mg by mouth daily.    . cholecalciferol (VITAMIN D) 1000 UNITS tablet Take 1 tablet (1,000 Units total) by mouth daily. (Patient not taking: Reported  on 02/26/2016) 100 tablet 3  . cyclobenzaprine (FLEXERIL) 5 MG tablet Take 1 tablet (5 mg total) by mouth 3 (three) times daily as needed for muscle spasms. 30 tablet 0  . docusate sodium (COLACE) 100 MG capsule Take 100 mg by mouth daily as needed for mild constipation or moderate constipation.    [name] escitalopram (LEXAPRO) 10 MG tablet Take 1 tablet (10 mg total) by mouth daily. 30 tablet 5  . feeding supplement, ENSURE ENLIVE, (ENSURE ENLIVE) LIQD Take 237 mLs by mouth 2 (two) times daily between meals. 237 mL 12  . levothyroxine (SYNTHROID, LEVOTHROID) 150 MCG tablet Take 1 tablet by mouth  daily (Patient taking differently: Take 150 mcg by mouth daily) 90 tablet 3  . LORazepam (ATIVAN) 1 MG tablet Take 0.5-1 tablets (0.5-1 mg total) by mouth every 6 (six) hours as needed for anxiety (nausea). 10 tablet 0  . oxyCODONE (OXY IR/ROXICODONE) 5 MG immediate release tablet Take 1 tablet (5 mg total) by mouth every 6 (six) hours as needed for moderate pain or severe pain. 10 tablet 0  . pantoprazole (PROTONIX) 40 MG tablet Take 1 tablet (40 mg total) by mouth 2 (two) times daily before a meal. (Patient taking differently: Take 40 mg by mouth daily as needed (reflux). ) 60 tablet 0  . polyethylene  glycol (MIRALAX / GLYCOLAX) packet Take 17 g by mouth daily as needed for mild constipation or moderate constipation.     . prochlorperazine (COMPAZINE) 10 MG tablet Take 10 mg by mouth every 6 (six) hours as needed for nausea or vomiting.   1  . ranitidine (ZANTAC) 150 MG tablet Take 150 mg by mouth 2 (two) times daily.    . valACYclovir (VALTREX) 1000 MG tablet Take 1 tablet (1,000 mg total) by mouth daily. (Patient taking differently: Take 500 mg by mouth daily. ) 60 tablet 6  . zolendronic acid (ZOMETA) 4 MG/5ML injection Inject 4 mg into the vein every 3 (three) months.    . zolpidem (AMBIEN) 5 MG tablet Take 1-2 tablets (5-10 mg total) by mouth at bedtime as needed for sleep. 30 tablet 1   No current facility-administered medications for this visit.    OBJECTIVE: Middle-aged white man in no acute distress  Filed Vitals:   03/14/16 1056  BP: 125/95  Pulse: 100  Temp: 98 F (36.7 C)  Resp: 18   Body mass index is 26.35 kg/(m^2).  Filed Weights   03/14/16 1056  Weight: 178 lb 8 oz (80.967 kg)   Sclerae unicteric, EOMs intact Oropharynx clear and moist No cervical or supraclavicular adenopathy Lungs no rales or rhonchi Heart regular rate and rhythm Abd soft, nontender, positive bowel sounds MSK no focal spinal tenderness, no upper extremity lymphedema Neuro: nonfocal, well oriented, appropriate affect   LAB RESULTS:  Lab Results  Component Value Date   WBC 7.7 03/14/2016   NEUTROABS 3.6 03/14/2016   HGB 11.6* 03/14/2016   HCT 35.5* 03/14/2016   MCV 93.8 03/14/2016   PLT 212 03/14/2016      Chemistry      Component Value Date/Time   NA 139 03/07/2016 1036   NA 137 02/28/2016 0318   K 4.2 03/07/2016 1036   K 4.0 02/28/2016 0318   CL 105 02/28/2016 0318   CL 107 03/10/2013 1352   CO2 24 03/07/2016 1036   CO2 28 02/28/2016 0318   BUN 15.0 03/07/2016 1036   BUN 11 02/28/2016 0318   CREATININE 1.1 03/07/2016 1036   CREATININE  0.74 02/28/2016 0318       Component Value Date/Time   CALCIUM 10.9* 03/07/2016 1036   CALCIUM 9.1 02/28/2016 0318   ALKPHOS 67 03/07/2016 1036   ALKPHOS 58 02/28/2016 0318   AST 27 03/07/2016 1036   AST 30 02/28/2016 0318   ALT 16 03/07/2016 1036   ALT 19 02/28/2016 0318   BILITOT 0.36 03/07/2016 1036   BILITOT 0.5 02/28/2016 0318     As of 02/21/2016, the M spike was 0.1, Free light chains 9.7, lambda 11.2, with a normal ratio of 0.87.  STUDIES: Dg Chest 2 View  02/26/2016  CLINICAL DATA:  FALL, PAIN Pt c/o back pain diffusely x 1 day, family states multiple falls today, weakness, chemotherapy pt. EXAM: CHEST  2 VIEW COMPARISON:  12/22/2015 FINDINGS: The heart size and mediastinal contours are within normal limits. Both lungs are clear. The visualized skeletal structures are unremarkable. IMPRESSION: No active cardiopulmonary disease. Electronically Signed   By: Skipper Cliche M.D.   On: 02/26/2016 19:07   Dg Lumbar Spine Complete  02/26/2016  CLINICAL DATA:  Diffuse back pain for 1 day. Multiple falls today. Multiple myeloma. EXAM: LUMBAR SPINE - COMPLETE 4+ VIEW COMPARISON:  The abdomen radiographs dated 11/08/2015 and abdomen and pelvis CT dated 11/07/2015. FINDINGS: Transitional thoracolumbar vertebra followed by 5 non-rib-bearing lumbar vertebrae. Stable mild to moderate levoconvex rotary scoliosis. Mild to moderate anterior and posterior spur formation at multiple levels. Facet degenerative changes in the mid and lower lumbar spine. An approximately 20% T12 superior endplate compression deformity with sclerosis is unchanged. No bony retropulsion. No acute fracture or subluxation. No pars defects. Mild atheromatous arterial calcifications. IMPRESSION: 1. No acute fracture or subluxation. 2. Stable old T12 compression deformity. 3. Multilevel degenerative changes. Electronically Signed   By: Claudie Revering M.D.   On: 02/26/2016 19:07   Mr Lumbar Spine W Wo Contrast  02/27/2016  CLINICAL DATA:  Back pain right  worse than left. History of multiple myeloma. Low-grade fever. Fatigue. Recent falls. EXAM: MRI LUMBAR SPINE WITHOUT AND WITH CONTRAST TECHNIQUE: Multiplanar and multiecho pulse sequences of the lumbar spine were obtained without and with intravenous contrast. CONTRAST:  7m MULTIHANCE GADOBENATE DIMEGLUMINE 529 MG/ML IV SOLN COMPARISON:  Radiography 02/26/2016. Abdominal CT 11/07/2015. Head CT 04/01/2013. FINDINGS: Segmentation:  5 lumbar type vertebral bodies. Alignment:  Curvature convex to the left with the apex at L2-3. Vertebrae: Old superior endplate fracture anteriorly at T12 with loss of height of 20%. Completely healed without residual edema. No evidence of recent fracture. Marrow pattern is somewhat heterogeneous which can be normal. There are no enhancing or destructive marrow space lesions in the region image to. Conus medullaris: Extends to the T12 level and appears normal. Paraspinal and other soft tissues: No abnormal finding. Disc levels: L1-2: Normal interspace. L2-3: Small central disc herniation indents the thecal sac slightly but does not affect the neural structures. Likely incidental. No stenosis. L3-4:  Mild bulging of the disc.  No stenosis. L4-5: Mild bulging of the disc. Mild facet hypertrophy. No stenosis. Facet arthritis could be associated with low back pain. L5-S1: Minimal bulging of the disc. Mild facet degeneration. No stenosis. Facet arthritis could be associated with mild low back pain. Sacroiliac joints appear unremarkable. IMPRESSION: Old healed minor superior endplate fracture anteriorly at T12. No recent fracture. Marrow pattern is somewhat heterogeneous, but is one of the patterns of normal. There are no enhancing lesions or destructive lesions. No statement can be made about the presence of myeloma cells  given this pattern. No stenosis or neural compression. Mild age related desiccation and bulging of the discs. Mild facet osteoarthritis at L4-5 and L5-S1, which could be  associated with low back pain. Electronically Signed   By: [name] Chimes M.D.   On: 02/27/2016 09:48   ASSESSMENT: 64 y.o.  [place] man with a history of multiple myeloma diagnosed February of 2012, with an initial M-spike of 2.6 g/dL, IFE showing an IgG lambda paraprotein and free lambda light chains in the urine. Cytogenetics showed trisomy 40. Bone marrow biopsy showed a 22% plasmacytosis. Treated with   (1) bortezomib (subcutaneously), lenalidomide, and dexamethasone, with repeat bone marrow biopsy May of 2012 showing 10% plasmacytosis   (2) high-dose chemotherapy with BCNU and melphalan at [facility], followed by stem cell rescue July of 2012   (3) on zoledronic acid started December of 2012, initially monthly, currently given every 3 months, most recent dose  12/07/2015  (4) low-dose lenalidomide resumed April 2013, interrupted several times.  Resumed again on 02/19/2013, ata dose of 5 mg daily, 21 days on and 7 days off, later further reduced to 7 days on, 7 days off  (5) CNS symptoms and abnormal brain MRI extensively evaluated by neurology with no definitive diagnosis established  (6) rising M spike noted June 2014 but did not meet criteria for carfilzomib study  (7) on lenalidomide 25 mg daily, 14 days on, 7 days off, starting 04/18/2013, interrupted December 2014 because of rash;   (a) resumed January 2015 at 10 mg/ day at 21 days on/ 7 days off  ([place]) starting 08/18/2014 decreased to 10 mg/ day 14 days on, 7 days off because of cytopenias  (c) as of February 2016 was on 5 mg daily 7 days on 7 days off  (d) lenalidomide discontinued December 2016 with evidence of disease progression  (8) transient global amnesia 05/29/2015, resolved without intervention  (9) starting PVD 10/25/2015 w ASA 325 thromboprophylaxis, valacyclovir prophylaxis, last dose 12/17/2015  (a) pomalidomide 4 mg/d days 1-14  ([place]) bortezomib sQ days 2,5,9,12 of each 21 day cycle  (c) dexamethasome 20 mg two days a  week  (10) metapneumovirus pneumonia April 2017  (a) completing course of steroids and week of bactrim mid April 2017  (11) status post second autologous transplant at [facility] 02/04/2016(preparatory regimen melphalan 200 mg/m)  PLAN:  Andre [name] is now day 40 from his second transplant. He has excellent counts. Whenever the cause of his recent confusion that has cleared. We obtained an SPEP on 02/21/2016, which showed his M spike had decreased to 0.1. We drew a repeat today and hopefully we will have those results available when he sees Dr. Bernita Buffy at up at [facility] later this week.  I urged Andre [name] to start doing his physical exercises every day and not just on the one day a week when the home health nurse is there.  Otherwise and delighted that he is back to work. He is managing his chronic problems with constipation rather well at present. We are continuing to do weekly labs although his labs have been very stable. Hopefully after he sees Dr. Bernita Buffy at her we can broaden that to every couple of weeks to make it easier on him.  He knows to call for any problems that may develop before his next visit here, which will be on 03/27/2016. Chauncey Cruel, MD 03/14/2016 t

## 2016-03-14 NOTE — Telephone Encounter (Signed)
appt made and avs printed °

## 2016-03-15 LAB — KAPPA/LAMBDA LIGHT CHAINS
IG KAPPA FREE LIGHT CHAIN: 41.1 mg/L — AB (ref 3.3–19.4)
Ig Lambda Free Light Chain: 19.8 mg/L (ref 5.7–26.3)
KAPPA/LAMBDA FLC RATIO: 2.08 — AB (ref 0.26–1.65)

## 2016-03-15 LAB — PROTEIN / CREATININE RATIO, URINE
Creatinine, Urine: 129.8 mg/dL
PROTEIN UR: 13.1 mg/dL
PROTEIN/CREAT RATIO: 101 mg/g{creat} (ref 0–200)

## 2016-03-16 LAB — PROTEIN ELECTROPHORESIS, SERUM
A/G RATIO SPE: 1.1 (ref 0.7–1.7)
Albumin: 3.6 g/dL (ref 2.9–4.4)
Alpha 1: 0.2 g/dL (ref 0.0–0.4)
Alpha 2: 0.8 g/dL (ref 0.4–1.0)
BETA: 1.2 g/dL (ref 0.7–1.3)
GLOBULIN, TOTAL: 3.3 g/dL (ref 2.2–3.9)
Gamma Globulin: 1.1 g/dL (ref 0.4–1.8)
M-SPIKE, %: 0.2 g/dL — AB
Total Protein: 6.9 g/dL (ref 6.0–8.5)

## 2016-03-20 ENCOUNTER — Other Ambulatory Visit (HOSPITAL_BASED_OUTPATIENT_CLINIC_OR_DEPARTMENT_OTHER): Payer: Commercial Managed Care - PPO

## 2016-03-20 DIAGNOSIS — C9000 Multiple myeloma not having achieved remission: Secondary | ICD-10-CM

## 2016-03-20 LAB — CBC WITH DIFFERENTIAL/PLATELET
BASO%: 0.6 % (ref 0.0–2.0)
BASOS ABS: 0 10*3/uL (ref 0.0–0.1)
EOS ABS: 0.4 10*3/uL (ref 0.0–0.5)
EOS%: 6.1 % (ref 0.0–7.0)
HEMATOCRIT: 33.5 % — AB (ref 38.4–49.9)
HEMOGLOBIN: 11.3 g/dL — AB (ref 13.0–17.1)
LYMPH#: 1.6 10*3/uL (ref 0.9–3.3)
LYMPH%: 27.5 % (ref 14.0–49.0)
MCH: 31.4 pg (ref 27.2–33.4)
MCHC: 33.7 g/dL (ref 32.0–36.0)
MCV: 93.3 fL (ref 79.3–98.0)
MONO#: 1 10*3/uL — ABNORMAL HIGH (ref 0.1–0.9)
MONO%: 16.7 % — AB (ref 0.0–14.0)
NEUT%: 49.1 % (ref 39.0–75.0)
NEUTROS ABS: 2.9 10*3/uL (ref 1.5–6.5)
Platelets: 178 10*3/uL (ref 140–400)
RBC: 3.59 10*6/uL — ABNORMAL LOW (ref 4.20–5.82)
RDW: 21.6 % — AB (ref 11.0–14.6)
WBC: 5.9 10*3/uL (ref 4.0–10.3)

## 2016-03-20 LAB — COMPREHENSIVE METABOLIC PANEL
ALBUMIN: 3.4 g/dL — AB (ref 3.5–5.0)
ALK PHOS: 73 U/L (ref 40–150)
ALT: 27 U/L (ref 0–55)
AST: 32 U/L (ref 5–34)
Anion Gap: 10 mEq/L (ref 3–11)
BUN: 19.2 mg/dL (ref 7.0–26.0)
CALCIUM: 9.6 mg/dL (ref 8.4–10.4)
CO2: 22 mEq/L (ref 22–29)
CREATININE: 1.1 mg/dL (ref 0.7–1.3)
Chloride: 107 mEq/L (ref 98–109)
EGFR: 72 mL/min/{1.73_m2} — ABNORMAL LOW (ref >90)
GLUCOSE: 84 mg/dL (ref 70–140)
POTASSIUM: 4.2 meq/L (ref 3.5–5.1)
SODIUM: 138 meq/L (ref 136–145)
TOTAL PROTEIN: 7.1 g/dL (ref 6.4–8.3)

## 2016-03-21 ENCOUNTER — Other Ambulatory Visit: Payer: Self-pay | Admitting: Nurse Practitioner

## 2016-03-26 NOTE — Progress Notes (Signed)
ID: Andre [name]   DOB: [birth year]  MR#: [id number]  PIR#:[id number]   PCP: Andre [name] SU: OTHER MD: Andre [name], Andre [name], [address] [phone]), Ardine Bjork  CHIEF COMPLAINT: Multiple myeloma  CURRENT TREATMENT: Observation, status post 2d autologous transplant 02/04/2016  HISTORY OF PRESENT ILLNESS: From the original intake nodes:  The patient was worked up for peptic ulcer disease in August of 2011, with significant bleeding and anemia.  The patient was Helicobacter pylori negative.  He received some epinephrine when he had his EGD and then started on Protonix.  [name] anemia slowly resolved so that by September, his hemoglobin was up to 10.9 and by earlier this month, his hemoglobin was up to 12.5.    As part of his general workup, he was found to have a slightly elevated globulin fraction.  In September, his total protein was 8.3 with an albumin of 3.8.  In January, the total protein was 8.4 and albumin 3.6.  With persistence of this slight abnormality, Dr. Olevia [name] obtained serum immunofixation and SPEP.  The SPTP showed an M-spike of 2.67 grams.  A total IgG was 4,190.  Total IgA low at 28.  Total IgM low at 28 also.  The immunofixation showed a monoclonal IgG lambda paraprotein.  There were also monoclonal free lambda light chains present. With this information, the patient was referred for further evaluation.   A diagnosis of myeloma was confirmed by bone marrow biopsy and subsequebnt treatment is as detailed below  INTERVAL HISTORY: Andre [name] returns today for follow-up of his multiple myeloma. Today is day 62 from his second transplant. Since the last visit here he was evaluated at [facility] 03/16/2016. His lab work is copied alone is very favorable. Continues on Valtrex for VZV prophylaxis, to be continued for a year post transplant.  He is here today for his next dose of zolendronate.  REVIEW OF SYSTEMS: Andre [name] continues to work  part-time. He describes himself as mildly to moderately fatigued he hopes to be able to work full-time in a couple of months. He had minor trauma to a right rib a couple of weeks ago and sometimes when he takes a deep breath it can hurt. It hurts more when he is lying down. Sometimes he takes Aleve for this. He does have heartburn. He has other areas of arthritis which are not more intense or persistent than usual. He can be forgetful, but denies confusion. A detailed review of systems today was otherwise stable  PAST MEDICAL HISTORY: Past Medical History  Diagnosis Date  . Hypertension   . Hyperlipidemia   . Duodenal ulcer   . Allergy   . Thyroid disease   . GERD (gastroesophageal reflux disease)   . Depression   . Anemia   . Hypothyroidism   . Asthma     no treatment x 20 years  . Arthritis   . Double vision     occurs at times   . Multiple myeloma 07/04/2011  Significant for peptic ulcer disease as noted above.  History of hyperlipidemia.  History of anxiety and depression.  History of GERD which is significantly improved with weight loss and history of reactive airway disease, possibly secondary to the GERD which also improved with weight loss.  There was a history of obesity, now much improved secondary to exercise and diet.   PAST SURGICAL HISTORY: Past Surgical History  Procedure Laterality Date  . Cardiolite study  11/25/2003    NORMAL  . Bone  marrow transplant  2011    for MM  . Tonsillectomy      FAMILY HISTORY Family History  Problem Relation Age of Onset  . Throat cancer Mother   . Hypertension Father   . Stroke Father   . Asthma Father   . Diabetes Father   . Pancreatic cancer Brother   [name] father died at the age of 54 following a stroke.  [name] mother died with cancer of the throat at the age of 55.   She had a history of depression and was a smoker.  Not clear how much alcohol she drank, according to the patient.  The patient had two brothers.   One died from the age of 62 from a "kidney problem."  The second one died at the age of 60 from pancreatic cancer. (This may have been duodenal cancer. 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  His wife of 37+ years was Andre [name], but they are now divorced. In 2016 he remarried, his current wife's name is Andre [name].  The patient has two daughters:  Andre [name], who works in [place], who is expecting her second child April 2016. .  They both live here in [place]. 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  The patient is very close to his daughters.  Andre [name] first daughter, Andre [name], born 03/06/2013, apparently has Weber/Osler/Rendu. The patient is not a church attender.  He does derive a great deal of support from friends at the "[employer]" which he attends very regularly, and also participates in Al-Anon even though actually there is no alcohol or drug problem in the family.  He participates in this group and he gets quite a bit of support from it.     ADVANCED DIRECTIVES: In place  HEALTH MAINTENANCE: (updated 07/16/2013) Social History  Substance Use Topics  . Smoking status: Former Smoker -- 3 years    Quit date: 03/27/1969  . Smokeless tobacco: Never Used  . Alcohol Use: No     Colonoscopy:  PSA:  Sept 2014, normal/Dr. [name]  Lipid panel: Sept 2014/Dr. [name]   Allergies  Allergen Reactions  . Atorvastatin Other (See Comments)  . Rosuvastatin     Other reaction(s): Liver Disorder  . Crestor [Rosuvastatin Calcium]     ADVERSE EFFECTS ON LIVER  . Lipitor [Atorvastatin Calcium]   . Septra [Sulfamethoxazole-Trimethoprim] Rash    Current Outpatient Prescriptions  Medication Sig Dispense Refill  . traZODone (DESYREL) 50 MG tablet Take 50 mg by mouth at bedtime.    [name]  aspirin 325 MG tablet Take 325 mg by mouth daily.    . cetirizine (ZYRTEC) 10 MG tablet Take 10 mg by mouth daily.    [name] docusate sodium (COLACE) 100 MG capsule Take 100 mg by mouth daily as needed for mild constipation or moderate constipation.    [name] escitalopram (LEXAPRO) 10 MG tablet Take 1 tablet (10 mg total) by mouth daily. 30 tablet 5  . feeding supplement, ENSURE ENLIVE, (ENSURE ENLIVE) LIQD Take 237 mLs by mouth 2 (two) times daily between meals. 237 mL 12  . levothyroxine (SYNTHROID, LEVOTHROID) 150 MCG tablet Take 1 tablet by mouth  daily (Patient taking differently: Take 150 mcg by mouth daily) 90 tablet 3  . pantoprazole (PROTONIX) 40 MG tablet Take  1 tablet (40 mg total) by mouth 2 (two) times daily before a meal. (Patient taking differently: Take 40 mg by mouth daily as needed (reflux). ) 60 tablet 0  . ranitidine (ZANTAC) 150 MG tablet Take 150 mg by mouth 2 (two) times daily.    . valACYclovir (VALTREX) 1000 MG tablet Take 1 tablet (1,000 mg total) by mouth daily. (Patient taking differently: Take 500 mg by mouth daily. ) 60 tablet 6  . zolendronic acid (ZOMETA) 4 MG/5ML injection Inject 4 mg into the vein every 3 (three) months.    . zolpidem (AMBIEN) 5 MG tablet Take 1-2 tablets (5-10 mg total) by mouth at bedtime as needed for sleep. 30 tablet 1   No current facility-administered medications for this visit.    OBJECTIVE: Middle-aged white manWho appears stated age 65 Vitals:   03/27/16 1221  BP: 122/82  Pulse: 85  Temp: 97.7 F (36.5 C)  Resp: 18   Body mass index is 26.78 kg/(m^2).  Filed Weights   03/27/16 1221  Weight: 181 lb 6.4 oz (82.283 kg)   Sclerae unicteric, pupils round and equal Oropharynx clear and moist-- no thrush or other lesions No cervical or supraclavicular adenopathy Lungs no rales or rhonchi Heart regular rate and rhythm Abd soft, nontender, positive bowel sounds MSK no focal spinal tenderness, no upper extremity lymphedema Neuro:  nonfocal, well oriented, appropriate affect     LAB RESULTS:   Immunoglobulin Profile (03/16/2016 1:27 PM) Immunoglobulin Profile (03/16/2016 1:27 PM)  Component Value Ref Range  IgG 1140 588 - 1573 mg/dL  IgM 57 57 - 237 mg/dL  IgA 187 46 - 287 mg/dL  IgE 173 4 - 269 IU/mL   Immunoglobulin Profile (03/16/2016 1:27 PM)  Specimen Performing Laboratory  [facility] [place], [place] [address]   Back to top of Lab Results   Immunoglobulin Free Lt Chains Blood (03/16/2016 1:27 PM) Immunoglobulin Free Lt Chains Blood (03/16/2016 1:27 PM)  Component Value Ref Range  Ig Free Light Chain Kappa 3.23 (H) 0.33 - 1.94 mg/dl  Ig Free Light Chain Lambda 1.98 0.57 - 2.63 mg/dl  Ig FLC Kappa/Lambda Ratio 1.63 0.26 - 1.65    Lab Results  Component Value Date   WBC 4.9 03/27/2016   NEUTROABS 2.3 03/27/2016   HGB 11.9* 03/27/2016   HCT 35.5* 03/27/2016   MCV 94.8 03/27/2016   PLT 156 03/27/2016      Chemistry      Component Value Date/Time   NA 139 03/27/2016 1203   NA 137 02/28/2016 0318   K 4.1 03/27/2016 1203   K 4.0 02/28/2016 0318   CL 105 02/28/2016 0318   CL 107 03/10/2013 1352   CO2 23 03/27/2016 1203   CO2 28 02/28/2016 0318   BUN 22.0 03/27/2016 1203   BUN 11 02/28/2016 0318   CREATININE 1.3 03/27/2016 1203   CREATININE 0.74 02/28/2016 0318      Component Value Date/Time   CALCIUM 9.9 03/27/2016 1203   CALCIUM 9.1 02/28/2016 0318   ALKPHOS 75 03/27/2016 1203   ALKPHOS 58 02/28/2016 0318   AST 29 03/27/2016 1203   AST 30 02/28/2016 0318   ALT 20 03/27/2016 1203   ALT 19 02/28/2016 0318   BILITOT 0.36 03/27/2016 1203   BILITOT 0.5 02/28/2016 0318     As of 02/21/2016, the M spike was 0.1, Free light chains 9.7, lambda 11.2, with a normal ratio of 0.87.  STUDIES: Dg  Chest 2 View  02/26/2016  CLINICAL DATA:  FALL, PAIN Pt c/o back pain diffusely x 1 day, family states multiple falls today, weakness,  chemotherapy pt. EXAM: CHEST  2 VIEW COMPARISON:  12/22/2015 FINDINGS: The heart size and mediastinal contours are within normal limits. Both lungs are clear. The visualized skeletal structures are unremarkable. IMPRESSION: No active cardiopulmonary disease. Electronically Signed   By: Skipper Cliche M.D.   On: 02/26/2016 19:07   Dg Lumbar Spine Complete  02/26/2016  CLINICAL DATA:  Diffuse back pain for 1 day. Multiple falls today. Multiple myeloma. EXAM: LUMBAR SPINE - COMPLETE 4+ VIEW COMPARISON:  The abdomen radiographs dated 11/08/2015 and abdomen and pelvis CT dated 11/07/2015. FINDINGS: Transitional thoracolumbar vertebra followed by 5 non-rib-bearing lumbar vertebrae. Stable mild to moderate levoconvex rotary scoliosis. Mild to moderate anterior and posterior spur formation at multiple levels. Facet degenerative changes in the mid and lower lumbar spine. An approximately 20% T12 superior endplate compression deformity with sclerosis is unchanged. No bony retropulsion. No acute fracture or subluxation. No pars defects. Mild atheromatous arterial calcifications. IMPRESSION: 1. No acute fracture or subluxation. 2. Stable old T12 compression deformity. 3. Multilevel degenerative changes. Electronically Signed   By: Claudie Revering M.D.   On: 02/26/2016 19:07   Mr Lumbar Spine W Wo Contrast  02/27/2016  CLINICAL DATA:  Back pain right worse than left. History of multiple myeloma. Low-grade fever. Fatigue. Recent falls. EXAM: MRI LUMBAR SPINE WITHOUT AND WITH CONTRAST TECHNIQUE: Multiplanar and multiecho pulse sequences of the lumbar spine were obtained without and with intravenous contrast. CONTRAST:  10m MULTIHANCE GADOBENATE DIMEGLUMINE 529 MG/ML IV SOLN COMPARISON:  Radiography 02/26/2016. Abdominal CT 11/07/2015. Head CT 04/01/2013. FINDINGS: Segmentation:  5 lumbar type vertebral bodies. Alignment:  Curvature convex to the left with the apex at L2-3. Vertebrae: Old superior endplate fracture anteriorly  at T12 with loss of height of 20%. Completely healed without residual edema. No evidence of recent fracture. Marrow pattern is somewhat heterogeneous which can be normal. There are no enhancing or destructive marrow space lesions in the region image to. Conus medullaris: Extends to the T12 level and appears normal. Paraspinal and other soft tissues: No abnormal finding. Disc levels: L1-2: Normal interspace. L2-3: Small central disc herniation indents the thecal sac slightly but does not affect the neural structures. Likely incidental. No stenosis. L3-4:  Mild bulging of the disc.  No stenosis. L4-5: Mild bulging of the disc. Mild facet hypertrophy. No stenosis. Facet arthritis could be associated with low back pain. L5-S1: Minimal bulging of the disc. Mild facet degeneration. No stenosis. Facet arthritis could be associated with mild low back pain. Sacroiliac joints appear unremarkable. IMPRESSION: Old healed minor superior endplate fracture anteriorly at T12. No recent fracture. Marrow pattern is somewhat heterogeneous, but is one of the patterns of normal. There are no enhancing lesions or destructive lesions. No statement can be made about the presence of myeloma cells given this pattern. No stenosis or neural compression. Mild age related desiccation and bulging of the discs. Mild facet osteoarthritis at L4-5 and L5-S1, which could be associated with low back pain. Electronically Signed   By: MNelson ChimesM.D.   On: 02/27/2016 09:48   ASSESSMENT: 65y.o.  [place] man with a history of multiple myeloma diagnosed February of 2012, with an initial M-spike of 2.6 g/dL, IFE showing an IgG lambda paraprotein and free lambda light chains in the urine. Cytogenetics showed trisomy 115 Bone marrow biopsy showed a 22% plasmacytosis. Treated with   (  1) bortezomib (subcutaneously), lenalidomide, and dexamethasone, with repeat bone marrow biopsy May of 2012 showing 10% plasmacytosis   (2) high-dose chemotherapy with  BCNU and melphalan at [facility], followed by stem cell rescue July of 2012   (3) on zoledronic acid started December of 2012, initially monthly, currently given every 3 months, most recent dose  12/07/2015  (4) low-dose lenalidomide resumed April 2013, interrupted several times.  Resumed again on 02/19/2013, ata dose of 5 mg daily, 21 days on and 7 days off, later further reduced to 7 days on, 7 days off  (5) CNS symptoms and abnormal brain MRI extensively evaluated by neurology with no definitive diagnosis established  (6) rising M spike noted June 2014 but did not meet criteria for carfilzomib study  (7) on lenalidomide 25 mg daily, 14 days on, 7 days off, starting 04/18/2013, interrupted December 2014 because of rash;   (a) resumed January 2015 at 10 mg/ day at 21 days on/ 7 days off  (Jamel) starting 08/18/2014 decreased to 10 mg/ day 14 days on, 7 days off because of cytopenias  (c) as of February 2016 was on 5 mg daily 7 days on 7 days off  (d) lenalidomide discontinued December 2016 with evidence of disease progression  (8) transient global amnesia 05/29/2015, resolved without intervention  (9) starting PVD 10/25/2015 w ASA 325 thromboprophylaxis, valacyclovir prophylaxis, last dose 12/17/2015  (a) pomalidomide 4 mg/d days 1-14  (Eliot) bortezomib sQ days 2,5,9,12 of each 21 day cycle  (c) dexamethasome 20 mg two days a week  (10) metapneumovirus pneumonia April 2017  (a) completing course of steroids and week of bactrim mid April 2017  (11) status post second autologous transplant at [facility] 02/04/2016(preparatory regimen melphalan 200 mg/m)  PLAN:  Andre [name] is now day 88 from his second transplant. His counts are excellent and were going to start checking his counts every 21 days.  He is going to receive zolendronate today. His next dose will be October 2.  He will be rescheduled at [facility] in August. Accordingly I will see him in early September. By that time he should be starting maintenance  therapy and we will operationalize that  I think the rib cage problem he's got likely were percent set minor rib fractures secondary to minor trauma. That requires only occasional analgesics.  Otherwise he knows to call for any problems that may develop before his next visit, and in particular if he develops any fever or rash or bleeding.    Chauncey Cruel, MD 03/27/2016 t

## 2016-03-27 ENCOUNTER — Ambulatory Visit (HOSPITAL_BASED_OUTPATIENT_CLINIC_OR_DEPARTMENT_OTHER): Payer: Commercial Managed Care - PPO | Admitting: Oncology

## 2016-03-27 ENCOUNTER — Ambulatory Visit (HOSPITAL_BASED_OUTPATIENT_CLINIC_OR_DEPARTMENT_OTHER): Payer: Commercial Managed Care - PPO

## 2016-03-27 ENCOUNTER — Other Ambulatory Visit (HOSPITAL_BASED_OUTPATIENT_CLINIC_OR_DEPARTMENT_OTHER): Payer: Commercial Managed Care - PPO

## 2016-03-27 ENCOUNTER — Telehealth: Payer: Self-pay | Admitting: Oncology

## 2016-03-27 VITALS — BP 122/82 | HR 85 | Temp 97.7°F | Resp 18 | Ht 69.0 in | Wt 181.4 lb

## 2016-03-27 DIAGNOSIS — R930 Abnormal findings on diagnostic imaging of skull and head, not elsewhere classified: Secondary | ICD-10-CM

## 2016-03-27 DIAGNOSIS — C9001 Multiple myeloma in remission: Secondary | ICD-10-CM | POA: Diagnosis not present

## 2016-03-27 DIAGNOSIS — Z9481 Bone marrow transplant status: Secondary | ICD-10-CM | POA: Diagnosis not present

## 2016-03-27 DIAGNOSIS — C9000 Multiple myeloma not having achieved remission: Secondary | ICD-10-CM

## 2016-03-27 DIAGNOSIS — R9089 Other abnormal findings on diagnostic imaging of central nervous system: Secondary | ICD-10-CM

## 2016-03-27 DIAGNOSIS — C9002 Multiple myeloma in relapse: Secondary | ICD-10-CM

## 2016-03-27 LAB — CBC WITH DIFFERENTIAL/PLATELET
BASO%: 0.5 % (ref 0.0–2.0)
Basophils Absolute: 0 10*3/uL (ref 0.0–0.1)
EOS ABS: 0.4 10*3/uL (ref 0.0–0.5)
EOS%: 7.9 % — ABNORMAL HIGH (ref 0.0–7.0)
HCT: 35.5 % — ABNORMAL LOW (ref 38.4–49.9)
HGB: 11.9 g/dL — ABNORMAL LOW (ref 13.0–17.1)
LYMPH%: 29 % (ref 14.0–49.0)
MCH: 31.7 pg (ref 27.2–33.4)
MCHC: 33.4 g/dL (ref 32.0–36.0)
MCV: 94.8 fL (ref 79.3–98.0)
MONO#: 0.8 10*3/uL (ref 0.1–0.9)
MONO%: 16 % — ABNORMAL HIGH (ref 0.0–14.0)
NEUT#: 2.3 10*3/uL (ref 1.5–6.5)
NEUT%: 46.6 % (ref 39.0–75.0)
PLATELETS: 156 10*3/uL (ref 140–400)
RBC: 3.75 10*6/uL — AB (ref 4.20–5.82)
RDW: 20.9 % — ABNORMAL HIGH (ref 11.0–14.6)
WBC: 4.9 10*3/uL (ref 4.0–10.3)
lymph#: 1.4 10*3/uL (ref 0.9–3.3)

## 2016-03-27 LAB — COMPREHENSIVE METABOLIC PANEL
ALK PHOS: 75 U/L (ref 40–150)
ALT: 20 U/L (ref 0–55)
ANION GAP: 10 meq/L (ref 3–11)
AST: 29 U/L (ref 5–34)
Albumin: 3.6 g/dL (ref 3.5–5.0)
BILIRUBIN TOTAL: 0.36 mg/dL (ref 0.20–1.20)
BUN: 22 mg/dL (ref 7.0–26.0)
CALCIUM: 9.9 mg/dL (ref 8.4–10.4)
CHLORIDE: 106 meq/L (ref 98–109)
CO2: 23 mEq/L (ref 22–29)
Creatinine: 1.3 mg/dL (ref 0.7–1.3)
EGFR: 58 mL/min/{1.73_m2} — AB (ref >90)
Glucose: 80 mg/dl (ref 70–140)
Potassium: 4.1 mEq/L (ref 3.5–5.1)
Sodium: 139 mEq/L (ref 136–145)
TOTAL PROTEIN: 7.6 g/dL (ref 6.4–8.3)

## 2016-03-27 MED ORDER — ZOLEDRONIC ACID 4 MG/100ML IV SOLN
4.0000 mg | Freq: Once | INTRAVENOUS | Status: AC
  Administered 2016-03-27: 4 mg via INTRAVENOUS
  Filled 2016-03-27: qty 100

## 2016-03-27 NOTE — Patient Instructions (Signed)

## 2016-03-27 NOTE — Telephone Encounter (Signed)
appt made and avs printed °

## 2016-04-03 ENCOUNTER — Other Ambulatory Visit: Payer: Commercial Managed Care - PPO

## 2016-04-10 ENCOUNTER — Other Ambulatory Visit: Payer: Commercial Managed Care - PPO

## 2016-04-13 ENCOUNTER — Other Ambulatory Visit: Payer: Self-pay | Admitting: Internal Medicine

## 2016-04-17 ENCOUNTER — Other Ambulatory Visit (HOSPITAL_BASED_OUTPATIENT_CLINIC_OR_DEPARTMENT_OTHER): Payer: Commercial Managed Care - PPO

## 2016-04-17 DIAGNOSIS — C9001 Multiple myeloma in remission: Secondary | ICD-10-CM

## 2016-04-17 DIAGNOSIS — C9002 Multiple myeloma in relapse: Secondary | ICD-10-CM

## 2016-04-17 DIAGNOSIS — C9000 Multiple myeloma not having achieved remission: Secondary | ICD-10-CM

## 2016-04-17 LAB — COMPREHENSIVE METABOLIC PANEL
ALBUMIN: 3.4 g/dL — AB (ref 3.5–5.0)
ALT: 21 U/L (ref 0–55)
ANION GAP: 8 meq/L (ref 3–11)
AST: 29 U/L (ref 5–34)
Alkaline Phosphatase: 83 U/L (ref 40–150)
BILIRUBIN TOTAL: 0.46 mg/dL (ref 0.20–1.20)
BUN: 16.6 mg/dL (ref 7.0–26.0)
CALCIUM: 9.4 mg/dL (ref 8.4–10.4)
CHLORIDE: 105 meq/L (ref 98–109)
CO2: 22 mEq/L (ref 22–29)
CREATININE: 1 mg/dL (ref 0.7–1.3)
EGFR: 74 mL/min/{1.73_m2} — ABNORMAL LOW (ref >90)
GLUCOSE: 81 mg/dL (ref 70–140)
POTASSIUM: 4.5 meq/L (ref 3.5–5.1)
Sodium: 136 mEq/L (ref 136–145)
Total Protein: 6.8 g/dL (ref 6.4–8.3)

## 2016-04-17 LAB — CBC WITH DIFFERENTIAL/PLATELET
BASO%: 0.6 % (ref 0.0–2.0)
BASOS ABS: 0 10*3/uL (ref 0.0–0.1)
EOS ABS: 0.3 10*3/uL (ref 0.0–0.5)
EOS%: 5.2 % (ref 0.0–7.0)
HEMATOCRIT: 32.8 % — AB (ref 38.4–49.9)
HEMOGLOBIN: 10.8 g/dL — AB (ref 13.0–17.1)
LYMPH#: 1.5 10*3/uL (ref 0.9–3.3)
LYMPH%: 29.8 % (ref 14.0–49.0)
MCH: 31.4 pg (ref 27.2–33.4)
MCHC: 33 g/dL (ref 32.0–36.0)
MCV: 95.2 fL (ref 79.3–98.0)
MONO#: 1.1 10*3/uL — AB (ref 0.1–0.9)
MONO%: 20.7 % — ABNORMAL HIGH (ref 0.0–14.0)
NEUT#: 2.2 10*3/uL (ref 1.5–6.5)
NEUT%: 43.7 % (ref 39.0–75.0)
PLATELETS: 146 10*3/uL (ref 140–400)
RBC: 3.45 10*6/uL — ABNORMAL LOW (ref 4.20–5.82)
RDW: 18.9 % — AB (ref 11.0–14.6)
WBC: 5.1 10*3/uL (ref 4.0–10.3)

## 2016-04-18 LAB — PROTEIN ELECTROPHORESIS, SERUM
A/G Ratio: 1.3 (ref 0.7–1.7)
ALPHA 1: 0.2 g/dL (ref 0.0–0.4)
ALPHA 2: 0.6 g/dL (ref 0.4–1.0)
Albumin: 3.5 g/dL (ref 2.9–4.4)
Beta: 1 g/dL (ref 0.7–1.3)
Gamma Globulin: 1 g/dL (ref 0.4–1.8)
Globulin, Total: 2.8 g/dL (ref 2.2–3.9)
Total Protein: 6.3 g/dL (ref 6.0–8.5)

## 2016-04-18 LAB — PROTEIN / CREATININE RATIO, URINE
Creatinine, Urine: 17.6 mg/dL
Protein/Creat Ratio: <227 mg/g creat — ABNORMAL HIGH (ref 0–200)

## 2016-04-18 LAB — KAPPA/LAMBDA LIGHT CHAINS
IG KAPPA FREE LIGHT CHAIN: 28.7 mg/L — AB (ref 3.3–19.4)
IG LAMBDA FREE LIGHT CHAIN: 20.5 mg/L (ref 5.7–26.3)
Kappa/Lambda FluidC Ratio: 1.4 (ref 0.26–1.65)

## 2016-04-20 ENCOUNTER — Ambulatory Visit (HOSPITAL_BASED_OUTPATIENT_CLINIC_OR_DEPARTMENT_OTHER): Payer: Commercial Managed Care - PPO | Admitting: Nurse Practitioner

## 2016-04-20 ENCOUNTER — Telehealth: Payer: Self-pay | Admitting: Nurse Practitioner

## 2016-04-20 ENCOUNTER — Other Ambulatory Visit (HOSPITAL_BASED_OUTPATIENT_CLINIC_OR_DEPARTMENT_OTHER): Payer: Commercial Managed Care - PPO

## 2016-04-20 ENCOUNTER — Ambulatory Visit (HOSPITAL_COMMUNITY)
Admission: RE | Admit: 2016-04-20 | Discharge: 2016-04-20 | Disposition: A | Discharge status: Home / Self Care | Payer: Commercial Managed Care - PPO | Source: Ambulatory Visit | Attending: Nurse Practitioner | Admitting: Nurse Practitioner

## 2016-04-20 ENCOUNTER — Ambulatory Visit (HOSPITAL_BASED_OUTPATIENT_CLINIC_OR_DEPARTMENT_OTHER): Payer: Commercial Managed Care - PPO

## 2016-04-20 ENCOUNTER — Telehealth: Payer: Self-pay | Admitting: *Deleted

## 2016-04-20 VITALS — BP 116/71 | HR 103 | Temp 98.3°F | Resp 18 | Ht 69.0 in | Wt 184.0 lb

## 2016-04-20 DIAGNOSIS — E860 Dehydration: Secondary | ICD-10-CM | POA: Diagnosis not present

## 2016-04-20 DIAGNOSIS — C9000 Multiple myeloma not having achieved remission: Secondary | ICD-10-CM

## 2016-04-20 DIAGNOSIS — C9001 Multiple myeloma in remission: Secondary | ICD-10-CM | POA: Diagnosis not present

## 2016-04-20 DIAGNOSIS — R609 Edema, unspecified: Secondary | ICD-10-CM | POA: Diagnosis not present

## 2016-04-20 DIAGNOSIS — Z9484 Stem cells transplant status: Secondary | ICD-10-CM

## 2016-04-20 DIAGNOSIS — R197 Diarrhea, unspecified: Secondary | ICD-10-CM

## 2016-04-20 LAB — CBC WITH DIFFERENTIAL/PLATELET
BASO%: 0.5 % (ref 0.0–2.0)
Basophils Absolute: 0 10*3/uL (ref 0.0–0.1)
EOS%: 6.6 % (ref 0.0–7.0)
Eosinophils Absolute: 0.3 10*3/uL (ref 0.0–0.5)
HCT: 32.6 % — ABNORMAL LOW (ref 38.4–49.9)
HGB: 11.1 g/dL — ABNORMAL LOW (ref 13.0–17.1)
LYMPH%: 29 % (ref 14.0–49.0)
MCH: 31.2 pg (ref 27.2–33.4)
MCHC: 34 g/dL (ref 32.0–36.0)
MCV: 91.6 fL (ref 79.3–98.0)
MONO#: 0.6 10*3/uL (ref 0.1–0.9)
MONO%: 15.7 % — AB (ref 0.0–14.0)
NEUT%: 48.2 % (ref 39.0–75.0)
NEUTROS ABS: 1.9 10*3/uL (ref 1.5–6.5)
Platelets: 117 10*3/uL — ABNORMAL LOW (ref 140–400)
RBC: 3.56 10*6/uL — AB (ref 4.20–5.82)
RDW: 17 % — ABNORMAL HIGH (ref 11.0–14.6)
WBC: 4 10*3/uL (ref 4.0–10.3)
lymph#: 1.2 10*3/uL (ref 0.9–3.3)

## 2016-04-20 LAB — COMPREHENSIVE METABOLIC PANEL
ALT: 16 U/L (ref 0–55)
ANION GAP: 10 meq/L (ref 3–11)
AST: 22 U/L (ref 5–34)
Albumin: 3.4 g/dL — ABNORMAL LOW (ref 3.5–5.0)
Alkaline Phosphatase: 87 U/L (ref 40–150)
BILIRUBIN TOTAL: 0.4 mg/dL (ref 0.20–1.20)
BUN: 17.3 mg/dL (ref 7.0–26.0)
CHLORIDE: 107 meq/L (ref 98–109)
CO2: 22 meq/L (ref 22–29)
CREATININE: 1.2 mg/dL (ref 0.7–1.3)
Calcium: 9.9 mg/dL (ref 8.4–10.4)
EGFR: 62 mL/min/{1.73_m2} — ABNORMAL LOW (ref >90)
GLUCOSE: 93 mg/dL (ref 70–140)
Potassium: 3.8 mEq/L (ref 3.5–5.1)
SODIUM: 139 meq/L (ref 136–145)
TOTAL PROTEIN: 7 g/dL (ref 6.4–8.3)

## 2016-04-20 MED ORDER — SODIUM CHLORIDE 0.9 % IV SOLN: INTRAVENOUS | Status: AC

## 2016-04-20 MED ORDER — SODIUM CHLORIDE 0.9 % IV SOLN
INTRAVENOUS | Status: AC
  Administered 2016-04-20: 16:00:00 via INTRAVENOUS

## 2016-04-20 MED ORDER — SODIUM CHLORIDE 0.9 % IV SOLN: 1000.0000 mL | Freq: Once | INTRAVENOUS | Status: DC

## 2016-04-20 NOTE — Telephone Encounter (Signed)
TC from patient stating he is experiencing diarrhea for the last 3 days. States he also has been feeling light headed. Denies fever. States he had chills yesterday.. Urine is clear. Also states he is having swelling in his ankles. He does have compression stockings (per recommendation of Val, RN with Dr. [name]).  Spoke with Selena Lesser, NP for [facility]. She will see him this afternoon.  TC to phone #  Provided by patient [phone]) and left message regarding lab and [facility] appts today.

## 2016-04-20 NOTE — Telephone Encounter (Signed)
Pt sched next available °

## 2016-04-20 NOTE — Patient Instructions (Signed)

## 2016-04-20 NOTE — Progress Notes (Signed)
*  Preliminary Results* Right lower extremity venous duplex completed. Right lower extremity is negative for deep vein thrombosis. There is no evidence of right Baker's cyst.  Incidental finding: There is evidence of a heterogenous area of the right groin, suggestive of a possible prominent inguinal lymph node.  04/20/2016 2:54 PM  Maudry Mayhew, Rishith.S., RVT, RDCS, RDMS

## 2016-04-26 ENCOUNTER — Encounter: Payer: Self-pay | Admitting: Nurse Practitioner

## 2016-04-26 DIAGNOSIS — R609 Edema, unspecified: Secondary | ICD-10-CM | POA: Insufficient documentation

## 2016-04-26 DIAGNOSIS — R600 Localized edema: Secondary | ICD-10-CM | POA: Insufficient documentation

## 2016-04-26 NOTE — Assessment & Plan Note (Signed)
Patient is status post his second transplant at [name] on 02/04/2016.  He is followed by Dr. Bernita [name] at [facility].  He continues with close observation only.  Patient is scheduled to return on 05/15/2016 for labs only.  Scheduled to return for a follow-up visit with Dr. Jana [name] on 05/30/1999

## 2016-04-26 NOTE — Assessment & Plan Note (Signed)
Patient reports increased bilateral lower extremity edema.  He states that he does have some compression hose that he frequently clears.  He denies any calf pain or new symptoms.  Exam today reveals bilateral lower extremity edema with the right lower extremity slightly larger than left.  Doppler ultrasound obtained today was negative for DVT.  Patient was advised to continue elevating his legs above the level of his heart whenever he is at rest.  He can also continue to wear his compression stockings.  He should remain as active as possible as well.

## 2016-04-26 NOTE — Progress Notes (Signed)
SYMPTOM MANAGEMENT CLINIC    Chief Complaint: Diarrhea, dehydration  HPI:  Andre [name] 65 y.o. male diagnosed with multiple myeloma.  Patient is status post his second transplant on 02/04/2016.  He is currently undergoing observation only.  Patient presents to the [facility] today with complaint of diarrhea and dehydration.    No history exists.    Review of Systems  Constitutional: Positive for malaise/fatigue.  Gastrointestinal: Positive for diarrhea.  All other systems reviewed and are negative.   Past Medical History:  Diagnosis Date  . Allergy   . Anemia   . Arthritis   . Asthma    no treatment x 20 years  . Depression   . Double vision    occurs at times   . Duodenal ulcer   . GERD (gastroesophageal reflux disease)   . Hyperlipidemia   . Hypertension   . Hypothyroidism   . Multiple myeloma 07/04/2011  . Thyroid disease     Past Surgical History:  Procedure Laterality Date  . BONE MARROW TRANSPLANT  2011   for MM  . CARDIOLITE STUDY  11/25/2003   NORMAL  . TONSILLECTOMY      has HYPERLIPIDEMIA; ANEMIA; Asthma; SCHATZKI'S RING; Peptic ulcer disease; HIATAL HERNIA; SLEEP APNEA, MILD; TRANSAMINASES, SERUM, ELEVATED; Multiple myeloma ([place]); TIA (transient ischemic attack); Transient diplopia; Fatigue; Insomnia; Reactive depression (situational); Vitamin D deficiency; Hypothyroidism; Hypogonadism male; Abnormal brain MRI; Cough; Skin lesion, superficial; Hemorrhage of rectum and anus; Raynaud's phenomenon; Diarrhea; Transient global amnesia; Actinic keratosis; SBO (small bowel obstruction) ([place]); Chronic anemia; Dehydration; Weakness; Asthma exacerbation; General weakness; Multiple myeloma not having achieved remission ([place]); Sepsis ([place]); Hyponatremia; HCAP (healthcare-associated pneumonia); Esophageal reflux; Rash and nonspecific skin eruption; Debility; Back pain; Bone marrow transplant status ([place]); and Peripheral edema on his problem list.    is  allergic to atorvastatin; rosuvastatin; crestor [rosuvastatin calcium]; lipitor [atorvastatin calcium]; and septra [sulfamethoxazole-trimethoprim].    Medication List       Accurate as of 04/20/16 11:59 PM. Always use your most recent med list.          aspirin 325 MG tablet Take 325 mg by mouth daily.   cetirizine 10 MG tablet Commonly known as:  ZYRTEC Take 10 mg by mouth daily.   docusate sodium 100 MG capsule Commonly known as:  COLACE Take 100 mg by mouth daily as needed for mild constipation or moderate constipation.   escitalopram 10 MG tablet Commonly known as:  LEXAPRO Take 1 tablet (10 mg total) by mouth daily.   feeding supplement (ENSURE ENLIVE) Liqd Take 237 mLs by mouth 2 (two) times daily between meals.   levothyroxine 150 MCG tablet Commonly known as:  SYNTHROID, LEVOTHROID take 1 tablet by mouth every morning ON AN EMPTY STOMACH   pantoprazole 40 MG tablet Commonly known as:  PROTONIX Take 1 tablet (40 mg total) by mouth 2 (two) times daily before a meal.   ranitidine 150 MG tablet Commonly known as:  ZANTAC Take 150 mg by mouth 2 (two) times daily.   traZODone 50 MG tablet Commonly known as:  DESYREL Take 50 mg by mouth at bedtime.   valACYclovir 1000 MG tablet Commonly known as:  VALTREX Take 1 tablet (1,000 mg total) by mouth daily.   zolendronic acid 4 MG/5ML injection Commonly known as:  ZOMETA Inject 4 mg into the vein every 3 (three) months.   zolpidem 5 MG tablet Commonly known as:  AMBIEN Take 1-2 tablets (5-10 mg total) by mouth at bedtime  as needed for sleep.        PHYSICAL EXAMINATION  Oncology Vitals 04/20/2016 04/20/2016  Height - -  Weight - -  Weight (lbs) - -  BMI (kg/m2) - -  Temp 97.9 -  Pulse 69 75  Resp 18 18  Resp (Historical as of 04/18/12) - -  SpO2 100 100  BSA (m2) - -   BP Readings from Last 2 Encounters:  04/20/16 124/67  04/20/16 116/71    Physical Exam  Constitutional: He is oriented to person,  place, and time and well-developed, well-nourished, and in no distress.  HENT:  Head: Normocephalic and atraumatic.  Mouth/Throat: Oropharynx is clear and moist.  Eyes: Conjunctivae and EOM are normal. Pupils are equal, round, and reactive to light. Right eye exhibits no discharge. Left eye exhibits no discharge. No scleral icterus.  Neck: Normal range of motion. Neck supple. No JVD present. No tracheal deviation present. No thyromegaly present.  Cardiovascular: Normal rate, regular rhythm, normal heart sounds and intact distal pulses.   Pulmonary/Chest: Effort normal and breath sounds normal. No respiratory distress. He has no wheezes. He has no rales. He exhibits no tenderness.  Abdominal: Soft. Bowel sounds are normal. He exhibits no distension and no mass. There is no tenderness. There is no rebound and no guarding.  Musculoskeletal: Normal range of motion. He exhibits no edema, tenderness or deformity.  Lymphadenopathy:    He has no cervical adenopathy.  Neurological: He is alert and oriented to person, place, and time. Gait normal.  Skin: Skin is warm and dry. No rash noted. No erythema. No pallor.  Psychiatric: Affect normal.  Nursing note and vitals reviewed.   LABORATORY DATA:. Appointment on 04/20/2016  Component Date Value Ref Range Status  . WBC 04/20/2016 4.0  4.0 - 10.3 10e3/uL Final  . NEUT# 04/20/2016 1.9  1.5 - 6.5 10e3/uL Final  . HGB 04/20/2016 11.1* 13.0 - 17.1 g/dL Final  . HCT 04/20/2016 32.6* 38.4 - 49.9 % Final  . Platelets 04/20/2016 117* 140 - 400 10e3/uL Final  . MCV 04/20/2016 91.6  79.3 - 98.0 fL Final  . MCH 04/20/2016 31.2  27.2 - 33.4 pg Final  . MCHC 04/20/2016 34.0  32.0 - 36.0 g/dL Final  . RBC 04/20/2016 3.56* 4.20 - 5.82 10e6/uL Final  . RDW 04/20/2016 17.0* 11.0 - 14.6 % Final  . lymph# 04/20/2016 1.2  0.9 - 3.3 10e3/uL Final  . MONO# 04/20/2016 0.6  0.1 - 0.9 10e3/uL Final  . Eosinophils Absolute 04/20/2016 0.3  0.0 - 0.5 10e3/uL Final  .  Basophils Absolute 04/20/2016 0.0  0.0 - 0.1 10e3/uL Final  . NEUT% 04/20/2016 48.2  39.0 - 75.0 % Final  . LYMPH% 04/20/2016 29.0  14.0 - 49.0 % Final  . MONO% 04/20/2016 15.7* 0.0 - 14.0 % Final  . EOS% 04/20/2016 6.6  0.0 - 7.0 % Final  . BASO% 04/20/2016 0.5  0.0 - 2.0 % Final  . Sodium 04/20/2016 139  136 - 145 mEq/L Final  . Potassium 04/20/2016 3.8  3.5 - 5.1 mEq/L Final  . Chloride 04/20/2016 107  98 - 109 mEq/L Final  . CO2 04/20/2016 22  22 - 29 mEq/L Final  . Glucose 04/20/2016 93  70 - 140 mg/dl Final  . BUN 04/20/2016 17.3  7.0 - 26.0 mg/dL Final  . Creatinine 04/20/2016 1.2  0.7 - 1.3 mg/dL Final  . Total Bilirubin 04/20/2016 0.40  0.20 - 1.20 mg/dL Final  . Alkaline Phosphatase 04/20/2016 87  40 - 150 U/L Final  . AST 04/20/2016 22  5 - 34 U/L Final  . ALT 04/20/2016 16  0 - 55 U/L Final  . Total Protein 04/20/2016 7.0  6.4 - 8.3 g/dL Final  . Albumin 04/20/2016 3.4* 3.5 - 5.0 g/dL Final  . Calcium 04/20/2016 9.9  8.4 - 10.4 mg/dL Final  . Anion Gap 04/20/2016 10  3 - 11 mEq/L Final  . EGFR 04/20/2016 62* >90 ml/min/1.73 m2 Final    RADIOGRAPHIC STUDIES: No results found.  ASSESSMENT/PLAN:    Peripheral edema Patient reports increased bilateral lower extremity edema.  He states that he does have some compression hose that he frequently clears.  He denies any calf pain or new symptoms.  Exam today reveals bilateral lower extremity edema with the right lower extremity slightly larger than left.  Doppler ultrasound obtained today was negative for DVT.  Patient was advised to continue elevating his legs above the level of his heart whenever he is at rest.  He can also continue to wear his compression stockings.  He should remain as active as possible as well.  Multiple myeloma [facility]) Patient is status post his second transplant at [facility] on 02/04/2016.  He is followed by Dr. Bernita Buffy at [facility].  He continues with close observation only.  Patient is  scheduled to return on 05/15/2016 for labs only.  Scheduled to return for a follow-up visit with Dr. [name] on 05/30/1999  Diarrhea Patient states he's been having multiple episodes of diarrhea for the past several days.  He has only occasionally taken some Imodium.  Patient was given written instructions regarding the use of over-the-counter Imodium on an as-needed basis.  Patient appears mildly date hydrated today; will receive IV fluid rehydration at the cancer center.  Dehydration Patient reports some frequent diarrhea for the past few days and feels dehydrated.  He will receive IV fluid rehydration while at the cancer Center today.  Was also encouraged to push fluids is much as possible.  Patient also stated that he felt a little lightheaded at times; but patient was not found to be orthostatic when blood pressures checked in different positions.  Most likely, occasional transient lightheaded is secondary to dehydration.  Patient was advised to call/return or go directly to the emergency department for any worsening symptoms whatsoever.   Patient stated understanding of all instructions; and was in agreement with this plan of care. The patient knows to call the clinic with any problems, questions or concerns.   Total time spent with patient was 25 minutes;  with greater than 75 percent of that time spent in face to face counseling regarding patient's symptoms,  and coordination of care and follow up.  Disclaimer:This dictation was prepared with Dragon/digital dictation along with [employer]. Any transcriptional errors that result from this process are unintentional.  Drue Second, NP 04/26/2016

## 2016-04-26 NOTE — Assessment & Plan Note (Signed)
Patient states he's been having multiple episodes of diarrhea for the past several days.  He has only occasionally taken some Imodium.  Patient was given written instructions regarding the use of over-the-counter Imodium on an as-needed basis.  Patient appears mildly date hydrated today; will receive IV fluid rehydration at the cancer center.

## 2016-04-26 NOTE — Assessment & Plan Note (Signed)
Patient reports some frequent diarrhea for the past few days and feels dehydrated.  He will receive IV fluid rehydration while at the cancer Center today.  Was also encouraged to push fluids is much as possible.  Patient also stated that he felt a little lightheaded at times; but patient was not found to be orthostatic when blood pressures checked in different positions.  Most likely, occasional transient lightheaded is secondary to dehydration.  Patient was advised to call/return or go directly to the emergency department for any worsening symptoms whatsoever.

## 2016-05-02 ENCOUNTER — Telehealth: Payer: Self-pay | Admitting: *Deleted

## 2016-05-02 NOTE — Telephone Encounter (Signed)
TC to patient to f/u on visit to [facility] on 04/20/16 for diarrhea and dehydration. Spoke with patient. He states he is doing better. Appetite good, drinking adequate amount of fluids. He is working on increasing his activity and exercising. He states he has only occasional loose stool. He has not had to use Imodium for this.  He plans to go back to work part time after labor day.  F/u appts in place for labs and Dr. [name] and Dr. Alvie Heidelberg @ [facility].  Pt voices understanding to call with any concerns or questions.

## 2016-05-09 ENCOUNTER — Other Ambulatory Visit: Payer: Self-pay | Admitting: Oncology

## 2016-05-09 ENCOUNTER — Telehealth: Payer: Self-pay | Admitting: Oncology

## 2016-05-09 NOTE — Telephone Encounter (Signed)
PATIENT CALLED TO [facility] APPT FOR LATER TIME. 05/09/16

## 2016-05-15 ENCOUNTER — Other Ambulatory Visit: Payer: Commercial Managed Care - PPO

## 2016-05-15 ENCOUNTER — Other Ambulatory Visit (HOSPITAL_BASED_OUTPATIENT_CLINIC_OR_DEPARTMENT_OTHER): Payer: Commercial Managed Care - PPO

## 2016-05-15 DIAGNOSIS — C9000 Multiple myeloma not having achieved remission: Secondary | ICD-10-CM | POA: Diagnosis not present

## 2016-05-15 DIAGNOSIS — C9002 Multiple myeloma in relapse: Secondary | ICD-10-CM

## 2016-05-15 LAB — CBC WITH DIFFERENTIAL/PLATELET
BASO%: 0.6 % (ref 0.0–2.0)
BASOS ABS: 0 10*3/uL (ref 0.0–0.1)
EOS%: 5 % (ref 0.0–7.0)
Eosinophils Absolute: 0.3 10*3/uL (ref 0.0–0.5)
HEMATOCRIT: 35.4 % — AB (ref 38.4–49.9)
HEMOGLOBIN: 11.9 g/dL — AB (ref 13.0–17.1)
LYMPH#: 1.7 10*3/uL (ref 0.9–3.3)
LYMPH%: 30.4 % (ref 14.0–49.0)
MCH: 31.1 pg (ref 27.2–33.4)
MCHC: 33.5 g/dL (ref 32.0–36.0)
MCV: 92.8 fL (ref 79.3–98.0)
MONO#: 0.8 10*3/uL (ref 0.1–0.9)
MONO%: 15.1 % — ABNORMAL HIGH (ref 0.0–14.0)
NEUT%: 48.9 % (ref 39.0–75.0)
NEUTROS ABS: 2.7 10*3/uL (ref 1.5–6.5)
Platelets: 189 10*3/uL (ref 140–400)
RBC: 3.81 10*6/uL — ABNORMAL LOW (ref 4.20–5.82)
RDW: 16.3 % — AB (ref 11.0–14.6)
WBC: 5.5 10*3/uL (ref 4.0–10.3)

## 2016-05-15 LAB — COMPREHENSIVE METABOLIC PANEL
ALBUMIN: 3.7 g/dL (ref 3.5–5.0)
ALK PHOS: 82 U/L (ref 40–150)
ALT: 16 U/L (ref 0–55)
AST: 26 U/L (ref 5–34)
Anion Gap: 11 mEq/L (ref 3–11)
BILIRUBIN TOTAL: 0.4 mg/dL (ref 0.20–1.20)
BUN: 18.8 mg/dL (ref 7.0–26.0)
CALCIUM: 9.9 mg/dL (ref 8.4–10.4)
CO2: 22 mEq/L (ref 22–29)
CREATININE: 1.2 mg/dL (ref 0.7–1.3)
Chloride: 105 mEq/L (ref 98–109)
EGFR: 62 mL/min/{1.73_m2} — ABNORMAL LOW (ref >90)
GLUCOSE: 84 mg/dL (ref 70–140)
POTASSIUM: 4.3 meq/L (ref 3.5–5.1)
Sodium: 139 mEq/L (ref 136–145)
TOTAL PROTEIN: 7.5 g/dL (ref 6.4–8.3)

## 2016-05-16 LAB — PROTEIN / CREATININE RATIO, URINE
Creatinine, Urine: 25.5 mg/dL
PROTEIN,TOTAL,URINE: <4 mg/dL
Protein/Creat Ratio: <157 mg/g creat (ref 0–200)

## 2016-05-16 LAB — KAPPA/LAMBDA LIGHT CHAINS
IG KAPPA FREE LIGHT CHAIN: 22.7 mg/L — AB (ref 3.3–19.4)
IG LAMBDA FREE LIGHT CHAIN: 24.6 mg/L (ref 5.7–26.3)
Kappa/Lambda FluidC Ratio: 0.92 (ref 0.26–1.65)

## 2016-05-17 LAB — PROTEIN ELECTROPHORESIS, SERUM
A/G RATIO SPE: 1.3 (ref 0.7–1.7)
ALPHA 2: 0.7 g/dL (ref 0.4–1.0)
Albumin: 3.8 g/dL (ref 2.9–4.4)
Alpha 1: 0.2 g/dL (ref 0.0–0.4)
Beta: 1.1 g/dL (ref 0.7–1.3)
GLOBULIN, TOTAL: 2.9 g/dL (ref 2.2–3.9)
Gamma Globulin: 1 g/dL (ref 0.4–1.8)
TOTAL PROTEIN: 6.7 g/dL (ref 6.0–8.5)

## 2016-05-28 NOTE — Progress Notes (Signed)
ID: [name]   DOB: 02-26-1951  MR#: [id number]  CSN#:[id number]   PCP: Andre [name] SU: OTHER MD: Andre [name], Andre [name], [address] [place] [phone]), Andre [name]  CHIEF COMPLAINT: Multiple myeloma  CURRENT TREATMENT: Observation, status post 2d autologous transplant 02/04/2016  HISTORY OF PRESENT ILLNESS: From the original intake nodes:  The patient was worked up for peptic ulcer disease in August of 2011, with significant bleeding and anemia.  The patient was Helicobacter pylori negative.  He received some epinephrine when he had his EGD and then started on Protonix.  [name] anemia slowly resolved so that by September, his hemoglobin was up to 10.9 and by earlier this month, his hemoglobin was up to 12.5.    As part of his general workup, he was found to have a slightly elevated globulin fraction.  In September, his total protein was 8.3 with an albumin of 3.8.  In January, the total protein was 8.4 and albumin 3.6.  With persistence of this slight abnormality, Andre [name] obtained serum immunofixation and SPEP.  The SPTP showed an M-spike of 2.67 grams.  A total IgG was 4,190.  Total IgA low at 28.  Total IgM low at 28 also.  The immunofixation showed a monoclonal IgG lambda paraprotein.  There were also monoclonal free lambda light chains present. With this information, the patient was referred for further evaluation.   A diagnosis of myeloma was confirmed by bone marrow biopsy and subsequebnt treatment is as detailed below  INTERVAL HISTORY:  Andre [name] returns today for follow-up of his multiple myeloma status post second transplant. He was supposed to have met with Dr. Alvie [name] at [facility] before this visit but there was a complication and the visit has been postponed until late this month  REVIEW OF SYSTEMS: Andre [name] has been back to work full-time about 3 weeks. He describes himself as mildly fatigued. His ankles are swelling a little. He  has compression stockings and he tries to keep them up as much as he can during the day. He does have arthritis pains here and there. There are not more intense or persistent than before. He is mildly constipated. He is walking about 1-1/2 or 2 miles most days. He has not had any fever, rash, or bleeding problems. A detailed review of systems today was otherwise stable  PAST MEDICAL HISTORY: Past Medical History:  Diagnosis Date  . Allergy   . Anemia   . Arthritis   . Asthma    no treatment x 20 years  . Depression   . Double vision    occurs at times   . Duodenal ulcer   . GERD (gastroesophageal reflux disease)   . Hyperlipidemia   . Hypertension   . Hypothyroidism   . Multiple myeloma 07/04/2011  . Thyroid disease   Significant for peptic ulcer disease as noted above.  History of hyperlipidemia.  History of anxiety and depression.  History of GERD which is significantly improved with weight loss and history of reactive airway disease, possibly secondary to the GERD which also improved with weight loss.  There was a history of obesity, now much improved secondary to exercise and diet.   PAST SURGICAL HISTORY: Past Surgical History:  Procedure Laterality Date  . BONE MARROW TRANSPLANT  2011   for MM  . CARDIOLITE STUDY  11/25/2003   NORMAL  . TONSILLECTOMY      FAMILY HISTORY Family History  Problem Relation Age of Onset  . Throat cancer  Mother   . Hypertension Father   . Stroke Father   . Asthma Father   . Diabetes Father   . Pancreatic cancer Brother   [name] father died at the age of 44 following a stroke.  [name] mother died with cancer of the throat at the age of 84.   She had a history of depression and was a smoker.  Not clear how much alcohol she drank, according to the patient.  The patient had two brothers.  One died from the age of 54 from a "kidney problem."  The second one died at the age of 40 from pancreatic cancer. (This may have been duodenal  cancer. 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  His wife of 37+ years was Andre [name], but they are now divorced. In 2016 he remarried, his current wife's name is Andre [name].  The patient has two daughters:  Andre [name], who works in [place], who is expecting her second child April 2016. .  They both live here in [place]. 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  The patient is very close to his daughters.  Andre [name] first daughter, Andre [name], born 03/06/2013, apparently has Weber/Osler/Rendu. The patient is not a church attender.  He does derive a great deal of support from friends at the "[employer]" which he attends very regularly, and also participates in Al-Anon even though actually there is no alcohol or drug problem in the family.  He participates in this group and he gets quite a bit of support from it.     ADVANCED DIRECTIVES: In place  HEALTH MAINTENANCE: (updated 07/16/2013) Social History  Substance Use Topics  . Smoking status: Former Smoker    Years: 3.00    Quit date: 03/27/1969  . Smokeless tobacco: Never Used  . Alcohol use No     Colonoscopy:  PSA:  Sept 2014, normal/Dr. [name]  Lipid panel: Sept 2014/Dr. [name]   Allergies  Allergen Reactions  . Atorvastatin Other (See Comments)  . Rosuvastatin     Other reaction(s): Liver Disorder  . Crestor [Rosuvastatin Calcium]     ADVERSE EFFECTS ON LIVER  . Lipitor [Atorvastatin Calcium]   . Septra [Sulfamethoxazole-Trimethoprim] Rash    Current Outpatient Prescriptions  Medication Sig Dispense Refill  . aspirin 325 MG tablet Take 325 mg by mouth daily.    . cetirizine (ZYRTEC) 10 MG tablet Take 10 mg by mouth daily.    [name] docusate sodium (COLACE) 100 MG capsule Take 100 mg by mouth daily as needed for mild  constipation or moderate constipation.    [name] escitalopram (LEXAPRO) 10 MG tablet Take 1 tablet (10 mg total) by mouth daily. 30 tablet 5  . feeding supplement, ENSURE ENLIVE, (ENSURE ENLIVE) LIQD Take 237 mLs by mouth 2 (two) times daily between meals. 237 mL 12  . levothyroxine (SYNTHROID, LEVOTHROID) 150 MCG tablet take 1 tablet by mouth every morning ON AN EMPTY STOMACH 90 tablet 1  . pantoprazole (PROTONIX) 40 MG tablet take 1 tablet by mouth twice a day before meals 60 tablet 0  . ranitidine (ZANTAC) 150 MG tablet Take 150 mg by mouth 2 (two) times daily.    . sildenafil (VIAGRA) 25 MG tablet Take 1 tablet (25 mg total) by mouth daily as needed for erectile  dysfunction. 10 tablet 0  . valACYclovir (VALTREX) 1000 MG tablet Take 1 tablet (1,000 mg total) by mouth daily. (Patient taking differently: Take 500 mg by mouth daily. ) 60 tablet 6  . zolendronic acid (ZOMETA) 4 MG/5ML injection Inject 4 mg into the vein every 3 (three) months.    . zolpidem (AMBIEN) 5 MG tablet Take 1-2 tablets (5-10 mg total) by mouth at bedtime as needed for sleep. 30 tablet 1   No current facility-administered medications for this visit.     OBJECTIVE: Middle-aged white man in no acute distress Vitals:   05/29/16 1626  BP: 129/81  Pulse: 91  Resp: 18  Temp: 98.4 F (36.9 C)   Body mass index is 26.61 kg/m.  Filed Weights   05/29/16 1626  Weight: 180 lb 3.2 oz (81.7 kg)   Sclerae unicteric, EOMs intact Oropharynx clear and moist No cervical or supraclavicular adenopathy Lungs no rales or rhonchi Heart regular rate and rhythm Abd soft, nontender, positive bowel sounds MSK no focal spinal tenderness, no upper extremity lymphedema Neuro: nonfocal, well oriented, appropriate affect  LAB RESULTS:   Immunoglobulin Profile (03/16/2016 1:27 PM) Immunoglobulin Profile (03/16/2016 1:27 PM)  Component Value Ref Range  IgG 1140 588 - 1573 mg/dL  IgM 57 57 - 237 mg/dL  IgA 187 46 - 287 mg/dL  IgE 173 4 -  269 IU/mL   Immunoglobulin Profile (03/16/2016 1:27 PM)  Specimen Performing Laboratory  [facility] [place], [place] [id number]   Back to top of Lab Results   Immunoglobulin Free Lt Chains Blood (03/16/2016 1:27 PM) Immunoglobulin Free Lt Chains Blood (03/16/2016 1:27 PM)  Component Value Ref Range  Ig Free Light Chain Kappa 3.23 (H) 0.33 - 1.94 mg/dl  Ig Free Light Chain Lambda 1.98 0.57 - 2.63 mg/dl  Ig FLC Kappa/Lambda Ratio 1.63 0.26 - 1.65    Lab Results  Component Value Date   WBC 5.5 05/15/2016   NEUTROABS 2.7 05/15/2016   HGB 11.9 (L) 05/15/2016   HCT 35.4 (L) 05/15/2016   MCV 92.8 05/15/2016   PLT 189 05/15/2016      Chemistry      Component Value Date/Time   NA 139 05/15/2016 1142   K 4.3 05/15/2016 1142   CL 105 02/28/2016 0318   CL 107 03/10/2013 1352   CO2 22 05/15/2016 1142   BUN 18.8 05/15/2016 1142   CREATININE 1.2 05/15/2016 1142      Component Value Date/Time   CALCIUM 9.9 05/15/2016 1142   ALKPHOS 82 05/15/2016 1142   AST 26 05/15/2016 1142   ALT 16 05/15/2016 1142   BILITOT 0.40 05/15/2016 1142     As of 02/21/2016, the M spike was 0.1, Free light chains 9.7, lambda 11.2, with a normal ratio of 0.87.  STUDIES: No results found.   ASSESSMENT: 65 y.o.  [place] man with a history of multiple myeloma diagnosed February of 2012, with an initial M-spike of 2.6 g/dL, IFE showing an IgG lambda paraprotein and free lambda light chains in the urine. Cytogenetics showed trisomy 32. Bone marrow biopsy showed a 22% plasmacytosis. Treated with   (1) bortezomib (subcutaneously), lenalidomide, and dexamethasone, with repeat bone marrow biopsy May of 2012 showing 10% plasmacytosis   (2) high-dose chemotherapy with BCNU and melphalan at [facility], followed by stem cell rescue July of 2012   (3) on zoledronic acid started December of 2012, initially monthly, currently given every 3 months, most recent  dose  12/07/2015  (4) low-dose lenalidomide resumed April 2013, interrupted several times.  Resumed again on 02/19/2013, ata dose of 5 mg daily, 21 days on and 7 days off, later further reduced to 7 days on, 7 days off  (5) CNS symptoms and abnormal brain MRI extensively evaluated by neurology with no definitive diagnosis established  (6) rising M spike noted June 2014 but did not meet criteria for carfilzomib study  (7) on lenalidomide 25 mg daily, 14 days on, 7 days off, starting 04/18/2013, interrupted December 2014 because of rash;   (a) resumed January 2015 at 10 mg/ day at 21 days on/ 7 days off  ([place]) starting 08/18/2014 decreased to 10 mg/ day 14 days on, 7 days off because of cytopenias  (c) as of February 2016 was on 5 mg daily 7 days on 7 days off  (d) lenalidomide discontinued December 2016 with evidence of disease progression  (8) transient global amnesia 05/29/2015, resolved without intervention  (9) starting PVD 10/25/2015 w ASA 325 thromboprophylaxis, valacyclovir prophylaxis, last dose 12/17/2015  (a) pomalidomide 4 mg/d days 1-14  ([place]) bortezomib sQ days 2,5,9,12 of each 21 day cycle  (c) dexamethasome 20 mg two days a week  (10) metapneumovirus pneumonia April 2017  (a) completing course of steroids and week of bactrim mid April 2017  (11) status post second autologous transplant at [facility] 02/04/2016(preparatory regimen melphalan 200 mg/m)  PLAN:  Andre [name] is doing were well os transplant with no M spike and a normal kappa/ lambda ratio as of his most recent labs 05/15/2016.  He will meet with Dr. Alvie [name] 06/14/2016. He will have lab work again then. Very likely he will be started on a maintenance regimen at that time.  He will then have lab work here late October and the third week in November and see me again right after Thanksgiving's by that time we will know if she is having any significant side effects from treatment.  Otherwise she knows to call for any problems  that may develop before his next visit here.    Chauncey Cruel, MD 05/29/2016 t

## 2016-05-29 ENCOUNTER — Telehealth: Payer: Self-pay | Admitting: Oncology

## 2016-05-29 ENCOUNTER — Ambulatory Visit (HOSPITAL_BASED_OUTPATIENT_CLINIC_OR_DEPARTMENT_OTHER): Payer: Commercial Managed Care - PPO | Admitting: Oncology

## 2016-05-29 VITALS — BP 129/81 | HR 91 | Temp 98.4°F | Resp 18 | Ht 69.0 in | Wt 180.2 lb

## 2016-05-29 DIAGNOSIS — D649 Anemia, unspecified: Secondary | ICD-10-CM | POA: Diagnosis not present

## 2016-05-29 DIAGNOSIS — C9000 Multiple myeloma not having achieved remission: Secondary | ICD-10-CM | POA: Diagnosis not present

## 2016-05-29 MED ORDER — SILDENAFIL CITRATE 25 MG PO TABS: 25.0000 mg | ORAL_TABLET | Freq: Every day | ORAL | 0 refills | Status: DC | PRN

## 2016-05-29 NOTE — Telephone Encounter (Signed)
appt made and avs printed °

## 2016-06-12 ENCOUNTER — Other Ambulatory Visit (HOSPITAL_BASED_OUTPATIENT_CLINIC_OR_DEPARTMENT_OTHER): Payer: Commercial Managed Care - PPO

## 2016-06-12 DIAGNOSIS — C9000 Multiple myeloma not having achieved remission: Secondary | ICD-10-CM

## 2016-06-12 DIAGNOSIS — C9002 Multiple myeloma in relapse: Secondary | ICD-10-CM

## 2016-06-12 LAB — COMPREHENSIVE METABOLIC PANEL
ALBUMIN: 3.5 g/dL (ref 3.5–5.0)
ALK PHOS: 81 U/L (ref 40–150)
ALT: 18 U/L (ref 0–55)
ANION GAP: 8 meq/L (ref 3–11)
AST: 22 U/L (ref 5–34)
BUN: 20.4 mg/dL (ref 7.0–26.0)
CALCIUM: 9.4 mg/dL (ref 8.4–10.4)
CHLORIDE: 108 meq/L (ref 98–109)
CO2: 24 mEq/L (ref 22–29)
CREATININE: 1.2 mg/dL (ref 0.7–1.3)
EGFR: 63 mL/min/{1.73_m2} — ABNORMAL LOW (ref >90)
Glucose: 79 mg/dl (ref 70–140)
Potassium: 4.7 mEq/L (ref 3.5–5.1)
Sodium: 139 mEq/L (ref 136–145)
TOTAL PROTEIN: 6.8 g/dL (ref 6.4–8.3)

## 2016-06-12 LAB — CBC WITH DIFFERENTIAL/PLATELET
BASO%: 0.6 % (ref 0.0–2.0)
Basophils Absolute: 0 10*3/uL (ref 0.0–0.1)
EOS%: 2.8 % (ref 0.0–7.0)
Eosinophils Absolute: 0.1 10*3/uL (ref 0.0–0.5)
HEMATOCRIT: 33.4 % — AB (ref 38.4–49.9)
HEMOGLOBIN: 11.2 g/dL — AB (ref 13.0–17.1)
LYMPH#: 1.4 10*3/uL (ref 0.9–3.3)
LYMPH%: 26.6 % (ref 14.0–49.0)
MCH: 31.2 pg (ref 27.2–33.4)
MCHC: 33.5 g/dL (ref 32.0–36.0)
MCV: 93.3 fL (ref 79.3–98.0)
MONO#: 0.7 10*3/uL (ref 0.1–0.9)
MONO%: 14.5 % — ABNORMAL HIGH (ref 0.0–14.0)
NEUT#: 2.8 10*3/uL (ref 1.5–6.5)
NEUT%: 55.5 % (ref 39.0–75.0)
PLATELETS: 176 10*3/uL (ref 140–400)
RBC: 3.58 10*6/uL — ABNORMAL LOW (ref 4.20–5.82)
RDW: 14.6 % (ref 11.0–14.6)
WBC: 5.1 10*3/uL (ref 4.0–10.3)

## 2016-06-13 LAB — PROTEIN / CREATININE RATIO, URINE
Creatinine, Urine: 25.6 mg/dL
PROTEIN,TOTAL,URINE: 4.7 mg/dL
Protein/Creat Ratio: 184 mg/g creat (ref 0–200)

## 2016-06-13 LAB — KAPPA/LAMBDA LIGHT CHAINS
IG KAPPA FREE LIGHT CHAIN: 21.4 mg/L — AB (ref 3.3–19.4)
IG LAMBDA FREE LIGHT CHAIN: 19.2 mg/L (ref 5.7–26.3)
KAPPA/LAMBDA FLC RATIO: 1.11 (ref 0.26–1.65)

## 2016-06-14 LAB — PROTEIN ELECTROPHORESIS, SERUM
A/G Ratio: 1.6 (ref 0.7–1.7)
ALBUMIN: 3.9 g/dL (ref 2.9–4.4)
ALPHA 1: 0.2 g/dL (ref 0.0–0.4)
ALPHA 2: 0.6 g/dL (ref 0.4–1.0)
BETA: 1 g/dL (ref 0.7–1.3)
GAMMA GLOBULIN: 0.7 g/dL (ref 0.4–1.8)
Globulin, Total: 2.4 g/dL (ref 2.2–3.9)
Total Protein: 6.3 g/dL (ref 6.0–8.5)

## 2016-06-19 ENCOUNTER — Ambulatory Visit (HOSPITAL_BASED_OUTPATIENT_CLINIC_OR_DEPARTMENT_OTHER): Payer: Commercial Managed Care - PPO

## 2016-06-19 ENCOUNTER — Other Ambulatory Visit (HOSPITAL_BASED_OUTPATIENT_CLINIC_OR_DEPARTMENT_OTHER): Payer: Commercial Managed Care - PPO

## 2016-06-19 VITALS — BP 141/76 | HR 64 | Temp 97.7°F | Resp 18

## 2016-06-19 DIAGNOSIS — C9000 Multiple myeloma not having achieved remission: Secondary | ICD-10-CM

## 2016-06-19 DIAGNOSIS — C9002 Multiple myeloma in relapse: Secondary | ICD-10-CM

## 2016-06-19 LAB — CBC WITH DIFFERENTIAL/PLATELET
BASO%: 0.2 % (ref 0.0–2.0)
Basophils Absolute: 0 10*3/uL (ref 0.0–0.1)
EOS%: 2.2 % (ref 0.0–7.0)
Eosinophils Absolute: 0.2 10*3/uL (ref 0.0–0.5)
HEMATOCRIT: 34 % — AB (ref 38.4–49.9)
HEMOGLOBIN: 11.1 g/dL — AB (ref 13.0–17.1)
LYMPH#: 1.6 10*3/uL (ref 0.9–3.3)
LYMPH%: 20.2 % (ref 14.0–49.0)
MCH: 30.2 pg (ref 27.2–33.4)
MCHC: 32.5 g/dL (ref 32.0–36.0)
MCV: 93 fL (ref 79.3–98.0)
MONO#: 0.9 10*3/uL (ref 0.1–0.9)
MONO%: 11.9 % (ref 0.0–14.0)
NEUT#: 5 10*3/uL (ref 1.5–6.5)
NEUT%: 65.5 % (ref 39.0–75.0)
Platelets: 168 10*3/uL (ref 140–400)
RBC: 3.66 10*6/uL — ABNORMAL LOW (ref 4.20–5.82)
RDW: 14.5 % (ref 11.0–14.6)
WBC: 7.7 10*3/uL (ref 4.0–10.3)

## 2016-06-19 LAB — COMPREHENSIVE METABOLIC PANEL
ALT: 17 U/L (ref 0–55)
AST: 23 U/L (ref 5–34)
Albumin: 3.4 g/dL — ABNORMAL LOW (ref 3.5–5.0)
Alkaline Phosphatase: 88 U/L (ref 40–150)
Anion Gap: 9 mEq/L (ref 3–11)
BUN: 17 mg/dL (ref 7.0–26.0)
CALCIUM: 9.3 mg/dL (ref 8.4–10.4)
CHLORIDE: 108 meq/L (ref 98–109)
CO2: 22 mEq/L (ref 22–29)
CREATININE: 1 mg/dL (ref 0.7–1.3)
EGFR: 75 mL/min/{1.73_m2} — ABNORMAL LOW (ref >90)
Glucose: 84 mg/dl (ref 70–140)
Potassium: 4.2 mEq/L (ref 3.5–5.1)
Sodium: 139 mEq/L (ref 136–145)
Total Bilirubin: <0.3 mg/dL (ref 0.20–1.20)
Total Protein: 6.7 g/dL (ref 6.4–8.3)

## 2016-06-19 MED ORDER — ZOLEDRONIC ACID 4 MG/100ML IV SOLN
4.0000 mg | Freq: Once | INTRAVENOUS | Status: AC
  Administered 2016-06-19: 4 mg via INTRAVENOUS
  Filled 2016-06-19: qty 100

## 2016-06-19 NOTE — Patient Instructions (Signed)

## 2016-07-10 ENCOUNTER — Telehealth: Payer: Self-pay | Admitting: *Deleted

## 2016-07-10 ENCOUNTER — Other Ambulatory Visit: Payer: Self-pay | Admitting: *Deleted

## 2016-07-10 MED ORDER — PANTOPRAZOLE SODIUM 40 MG PO TBEC: DELAYED_RELEASE_TABLET | ORAL | 3 refills | Status: DC

## 2016-07-10 NOTE — Telephone Encounter (Signed)
Per Care Everywhere - obtained note per Dr Samule Ohm that states maintenance regimen discussed with recommendations.  See below : DISPOSITION: Andre [name] is a 65 year old gentleman with IgG lambda Multiple Myeloma ISS II, standard risk. He is s/p high dose BCNU/melphalan followed by autologous transplant in 2012. He had disease progression in 08/2015 and he was started on salvage therapy with velcade/pomalidomide/dexa in 1/17 and has achieved VGPR. He is underwent salvage transplant with high dose Melphalan 28m/m2 and autologous stem cell transplant on 02/04/16 (3.42x10^6/kg CD34+stem cell infused). Today, we discussed maintenance therapy to obtain longer remission. Recommend Velcade 1.3841mm2, pomalidomide 1-41m641m1-D21 based on tolerability.   PLAN: 1. MM labs: today M-spike IgG lambda 0.13 kappa 2.16 lambda 1.5 ratio 1.44 2. Continue Valtrex 500 twice daily.  3. Recommend maintenance Velcade 1.3mg67m every other week, pomalidomide 1-41mg 94mD21 based on tolerability.  4. Recommend Monthly Zometa 5. Pneumo-vax at 6 mths 6. Lower extremity edema: recommend 24 urine; Echo. 7. RTC in 9 mths or sooner if needed; will administer 12 mth vaccinations at that time.  KIM RENEEHarriet Pho  I personally saw the patient and performed a substantive portion of this encounter in conjunction with the listed APP as documented above.  CristJeanann Lewandowsky Above will be given to MD for review for appropriate follow up.

## 2016-07-10 NOTE — Telephone Encounter (Signed)
"  I'm post transplant.  Supposed to receive Velcade. Pomalyst, Thalidomide three weeks on one week off according to [facility].  They seem surprised I'm not receiving these maintenance drugs yet from my local oncologist.  Please call me [phone].

## 2016-07-10 NOTE — Telephone Encounter (Signed)
Per further review in Care Everywhere pt was seen by Dr Cathren Harsh 03/16/2016 with recommendation to return to [facility] to discuss started maintenance regimen - follow up appointment delayed until 9/27 - unsure why.  Note given to MD for appropriate recommendations.

## 2016-07-11 ENCOUNTER — Other Ambulatory Visit (HOSPITAL_BASED_OUTPATIENT_CLINIC_OR_DEPARTMENT_OTHER): Payer: Commercial Managed Care - PPO

## 2016-07-11 DIAGNOSIS — C9000 Multiple myeloma not having achieved remission: Secondary | ICD-10-CM

## 2016-07-11 DIAGNOSIS — C9002 Multiple myeloma in relapse: Secondary | ICD-10-CM | POA: Diagnosis not present

## 2016-07-11 LAB — CBC WITH DIFFERENTIAL/PLATELET
BASO%: 0.2 % (ref 0.0–2.0)
BASOS ABS: 0 10*3/uL (ref 0.0–0.1)
EOS ABS: 0.2 10*3/uL (ref 0.0–0.5)
EOS%: 3.4 % (ref 0.0–7.0)
HEMATOCRIT: 32 % — AB (ref 38.4–49.9)
HEMOGLOBIN: 10.8 g/dL — AB (ref 13.0–17.1)
LYMPH#: 1.5 10*3/uL (ref 0.9–3.3)
LYMPH%: 31.5 % (ref 14.0–49.0)
MCH: 30 pg (ref 27.2–33.4)
MCHC: 33.8 g/dL (ref 32.0–36.0)
MCV: 88.9 fL (ref 79.3–98.0)
MONO#: 0.7 10*3/uL (ref 0.1–0.9)
MONO%: 14.2 % — ABNORMAL HIGH (ref 0.0–14.0)
NEUT#: 2.4 10*3/uL (ref 1.5–6.5)
NEUT%: 50.7 % (ref 39.0–75.0)
PLATELETS: 134 10*3/uL — AB (ref 140–400)
RBC: 3.6 10*6/uL — ABNORMAL LOW (ref 4.20–5.82)
RDW: 14.4 % (ref 11.0–14.6)
WBC: 4.6 10*3/uL (ref 4.0–10.3)

## 2016-07-11 LAB — COMPREHENSIVE METABOLIC PANEL
ALBUMIN: 3.4 g/dL — AB (ref 3.5–5.0)
ALK PHOS: 76 U/L (ref 40–150)
ALT: 16 U/L (ref 0–55)
AST: 21 U/L (ref 5–34)
Anion Gap: 9 mEq/L (ref 3–11)
BUN: 22.6 mg/dL (ref 7.0–26.0)
CHLORIDE: 106 meq/L (ref 98–109)
CO2: 21 meq/L — AB (ref 22–29)
Calcium: 9.3 mg/dL (ref 8.4–10.4)
Creatinine: 1.1 mg/dL (ref 0.7–1.3)
EGFR: 71 mL/min/{1.73_m2} — AB (ref >90)
GLUCOSE: 97 mg/dL (ref 70–140)
POTASSIUM: 4.3 meq/L (ref 3.5–5.1)
SODIUM: 136 meq/L (ref 136–145)
Total Bilirubin: 0.24 mg/dL (ref 0.20–1.20)
Total Protein: 6.6 g/dL (ref 6.4–8.3)

## 2016-07-12 LAB — KAPPA/LAMBDA LIGHT CHAINS
Ig Kappa Free Light Chain: 22 mg/L — ABNORMAL HIGH (ref 3.3–19.4)
Ig Lambda Free Light Chain: 13.3 mg/L (ref 5.7–26.3)
Kappa/Lambda FluidC Ratio: 1.65 (ref 0.26–1.65)

## 2016-07-12 LAB — PROTEIN / CREATININE RATIO, URINE
Creatinine, Urine: 36.9 mg/dL
PROTEIN,TOTAL,URINE: 5.2 mg/dL
Protein/Creat Ratio: 141 mg/g creat (ref 0–200)

## 2016-07-13 LAB — PROTEIN ELECTROPHORESIS, SERUM
A/G RATIO SPE: 1.1 (ref 0.7–1.7)
ALBUMIN: 3.3 g/dL (ref 2.9–4.4)
Alpha 1: 0.3 g/dL (ref 0.0–0.4)
Alpha 2: 0.7 g/dL (ref 0.4–1.0)
BETA: 1.1 g/dL (ref 0.7–1.3)
GAMMA GLOBULIN: 0.8 g/dL (ref 0.4–1.8)
GLOBULIN, TOTAL: 2.9 g/dL (ref 2.2–3.9)
TOTAL PROTEIN: 6.2 g/dL (ref 6.0–8.5)

## 2016-07-17 ENCOUNTER — Other Ambulatory Visit: Payer: Self-pay | Admitting: Oncology

## 2016-07-17 ENCOUNTER — Other Ambulatory Visit: Payer: Self-pay

## 2016-07-17 DIAGNOSIS — C9000 Multiple myeloma not having achieved remission: Secondary | ICD-10-CM

## 2016-07-17 MED ORDER — POMALIDOMIDE 1 MG PO CAPS: 1.0000 mg | ORAL_CAPSULE | Freq: Every day | ORAL | 6 refills | Status: DC

## 2016-07-17 NOTE — Telephone Encounter (Signed)
I returned call to pt.  He would like to start his Velcade on 11/9.  9 am.  Scheduling request sent.  MD notified.

## 2016-07-19 ENCOUNTER — Other Ambulatory Visit: Payer: Self-pay | Admitting: *Deleted

## 2016-07-19 MED ORDER — POMALIDOMIDE 1 MG PO CAPS: 1.0000 mg | ORAL_CAPSULE | Freq: Every day | ORAL | 6 refills | Status: DC

## 2016-07-19 NOTE — Telephone Encounter (Signed)
Informed per pharmacy - prescription for pomalyst sent to speciality pharmacy without [facility] number.  This RN obtained- reordered medication with [employer] number on prescription.

## 2016-07-24 ENCOUNTER — Telehealth: Payer: Self-pay

## 2016-07-24 ENCOUNTER — Other Ambulatory Visit: Payer: Self-pay | Admitting: Oncology

## 2016-07-24 NOTE — Telephone Encounter (Signed)
I confirmed pt appt on 11/9 for velcade.  Pt reports he should receive pomalyst today and will start it immediately.  Pt is requesting assistance on the monthly refills - it is a source of high stress for him.  I explained to pt that the pomalyst requires a monthly certification due to side effects.  I advised I would discuss with desk RN.  Pt voiced understanding.    Routed to [employer]

## 2016-07-26 ENCOUNTER — Telehealth: Payer: Self-pay | Admitting: Oncology

## 2016-07-26 NOTE — Telephone Encounter (Signed)
11/24 Appointments rescheduled to 11/28 per patient request. The patient will be out of town on 11/24.

## 2016-07-27 ENCOUNTER — Telehealth: Payer: Self-pay | Admitting: *Deleted

## 2016-07-27 ENCOUNTER — Ambulatory Visit (HOSPITAL_BASED_OUTPATIENT_CLINIC_OR_DEPARTMENT_OTHER): Payer: Commercial Managed Care - PPO

## 2016-07-27 ENCOUNTER — Other Ambulatory Visit: Payer: Self-pay | Admitting: Oncology

## 2016-07-27 VITALS — BP 141/89 | HR 54 | Temp 97.9°F | Resp 17

## 2016-07-27 DIAGNOSIS — C9000 Multiple myeloma not having achieved remission: Secondary | ICD-10-CM

## 2016-07-27 DIAGNOSIS — Z5112 Encounter for antineoplastic immunotherapy: Secondary | ICD-10-CM

## 2016-07-27 DIAGNOSIS — Z9481 Bone marrow transplant status: Secondary | ICD-10-CM

## 2016-07-27 DIAGNOSIS — C9002 Multiple myeloma in relapse: Secondary | ICD-10-CM

## 2016-07-27 MED ORDER — PROCHLORPERAZINE MALEATE 10 MG PO TABS
ORAL_TABLET | ORAL | Status: AC
  Filled 2016-07-27: qty 1

## 2016-07-27 MED ORDER — BORTEZOMIB CHEMO SQ INJECTION 3.5 MG (2.5MG/ML)
1.3000 mg/m2 | Freq: Once | INTRAMUSCULAR | Status: AC
  Administered 2016-07-27: 2.5 mg via SUBCUTANEOUS
  Filled 2016-07-27: qty 2.5

## 2016-07-27 MED ORDER — SODIUM CHLORIDE 0.9 % IV SOLN
Freq: Once | INTRAVENOUS | Status: AC
  Administered 2016-07-27: 10:00:00 via INTRAVENOUS

## 2016-07-27 MED ORDER — BORTEZOMIB CHEMO IV INJECTION 3.5 MG: 1.3000 mg/m2 | Freq: Once | INTRAMUSCULAR | Status: DC

## 2016-07-27 MED ORDER — PROCHLORPERAZINE MALEATE 10 MG PO TABS: 10.0000 mg | ORAL_TABLET | Freq: Once | ORAL | Status: DC

## 2016-07-27 MED ORDER — PROCHLORPERAZINE MALEATE 10 MG PO TABS
10.0000 mg | ORAL_TABLET | Freq: Once | ORAL | Status: AC
  Administered 2016-07-27: 10 mg via ORAL

## 2016-07-27 MED ORDER — ZOLEDRONIC ACID 4 MG/100ML IV SOLN
4.0000 mg | Freq: Once | INTRAVENOUS | Status: AC
  Administered 2016-07-27: 4 mg via INTRAVENOUS
  Filled 2016-07-27: qty 100

## 2016-07-27 NOTE — Telephone Encounter (Signed)
Per LOS I have scheduled appts and noitified the scheduler

## 2016-07-27 NOTE — Patient Instructions (Addendum)
[facility] Discharge Instructions for Patients Receiving Chemotherapy  Today you received the following chemotherapy agents: Velcade  To help prevent nausea and vomiting after your treatment, we encourage you to take your nausea medication as directed.    If you develop nausea and vomiting that is not controlled by your nausea medication, call the clinic.   BELOW ARE SYMPTOMS THAT SHOULD BE REPORTED IMMEDIATELY:  *FEVER GREATER THAN 100.5 F  *CHILLS WITH OR WITHOUT FEVER  NAUSEA AND VOMITING THAT IS NOT CONTROLLED WITH YOUR NAUSEA MEDICATION  *UNUSUAL SHORTNESS OF BREATH  *UNUSUAL BRUISING OR BLEEDING  TENDERNESS IN MOUTH AND THROAT WITH OR WITHOUT PRESENCE OF ULCERS  *URINARY PROBLEMS  *BOWEL PROBLEMS  UNUSUAL RASH Items with * indicate a potential emergency and should be followed up as soon as possible.  Feel free to call the clinic you have any questions or concerns. The clinic phone number is [phone].  Please show the [place] at check-in to the Emergency Department and triage nurse.  Zoledronic Acid injection (Hypercalcemia, Oncology) What is this medicine? ZOLEDRONIC ACID (ZOE le dron ik AS id) lowers the amount of calcium loss from bone. It is used to treat too much calcium in your blood from cancer. It is also used to prevent complications of cancer that has spread to the bone. This medicine may be used for other purposes; ask your health care provider or pharmacist if you have questions. What should I tell my health care provider before I take this medicine? They need to know if you have any of these conditions: -aspirin-sensitive asthma -cancer, especially if you are receiving medicines used to treat cancer -dental disease or wear dentures -infection -kidney disease -receiving corticosteroids like dexamethasone or prednisone -an unusual or allergic reaction to zoledronic acid, other medicines, foods, dyes, or  preservatives -pregnant or trying to get pregnant -breast-feeding How should I use this medicine? This medicine is for infusion into a vein. It is given by a health care professional in a hospital or clinic setting. Talk to your pediatrician regarding the use of this medicine in children. Special care may be needed. Overdosage: If you think you have taken too much of this medicine contact a poison control center or emergency room at once. NOTE: This medicine is only for you. Do not share this medicine with others. What if I miss a dose? It is important not to miss your dose. Call your doctor or health care professional if you are unable to keep an appointment. What may interact with this medicine? -certain antibiotics given by injection -NSAIDs, medicines for pain and inflammation, like ibuprofen or naproxen -some diuretics like bumetanide, furosemide -teriparatide -thalidomide This list may not describe all possible interactions. Give your health care provider a list of all the medicines, herbs, non-prescription drugs, or dietary supplements you use. Also tell them if you smoke, drink alcohol, or use illegal drugs. Some items may interact with your medicine. What should I watch for while using this medicine? Visit your doctor or health care professional for regular checkups. It may be some time before you see the benefit from this medicine. Do not stop taking your medicine unless your doctor tells you to. Your doctor may order blood tests or other tests to see how you are doing. Women should inform their doctor if they wish to become pregnant or think they might be pregnant. There is a potential for serious side effects to an unborn child. Talk to your health care professional  or pharmacist for more information. You should make sure that you get enough calcium and vitamin D while you are taking this medicine. Discuss the foods you eat and the vitamins you take with your health care  professional. Some people who take this medicine have severe bone, joint, and/or muscle pain. This medicine may also increase your risk for jaw problems or a broken thigh bone. Tell your doctor right away if you have severe pain in your jaw, bones, joints, or muscles. Tell your doctor if you have any pain that does not go away or that gets worse. Tell your dentist and dental surgeon that you are taking this medicine. You should not have major dental surgery while on this medicine. See your dentist to have a dental exam and fix any dental problems before starting this medicine. Take good care of your teeth while on this medicine. Make sure you see your dentist for regular follow-up appointments. What side effects may I notice from receiving this medicine? Side effects that you should report to your doctor or health care professional as soon as possible: -allergic reactions like skin rash, itching or hives, swelling of the face, lips, or tongue -anxiety, confusion, or depression -breathing problems -changes in vision -eye pain -feeling faint or lightheaded, falls -jaw pain, especially after dental work -mouth sores -muscle cramps, stiffness, or weakness -redness, blistering, peeling or loosening of the skin, including inside the mouth -trouble passing urine or change in the amount of urine Side effects that usually do not require medical attention (report to your doctor or health care professional if they continue or are bothersome): -bone, joint, or muscle pain -constipation -diarrhea -fever -hair loss -irritation at site where injected -loss of appetite -nausea, vomiting -stomach upset -trouble sleeping -trouble swallowing -weak or tired This list may not describe all possible side effects. Call your doctor for medical advice about side effects. You may report side effects to FDA at 1-800-FDA-1088. Where should I keep my medicine? This drug is given in a hospital or clinic and will not  be stored at home. NOTE: This sheet is a summary. It may not cover all possible information. If you have questions about this medicine, talk to your doctor, pharmacist, or health care provider.    2016, Elsevier/Gold Standard. (2014-01-31 14:19:39)  

## 2016-07-27 NOTE — Progress Notes (Signed)
Per Dr. [name] okay to treat with labs from 07/11/16.  Per Dr. [name], Zometa to be given monthly, and is due today. Pt aware and verbalizes understanding.  Pt tolerated treatment well. Pt monitored 30 minutes post treatment. Pt and VS stable at discharge.

## 2016-08-05 ENCOUNTER — Other Ambulatory Visit: Payer: Self-pay | Admitting: Internal Medicine

## 2016-08-07 ENCOUNTER — Other Ambulatory Visit (HOSPITAL_BASED_OUTPATIENT_CLINIC_OR_DEPARTMENT_OTHER): Payer: Commercial Managed Care - PPO

## 2016-08-07 DIAGNOSIS — C9000 Multiple myeloma not having achieved remission: Secondary | ICD-10-CM

## 2016-08-07 DIAGNOSIS — C9002 Multiple myeloma in relapse: Secondary | ICD-10-CM | POA: Diagnosis not present

## 2016-08-07 LAB — CBC WITH DIFFERENTIAL/PLATELET
BASO%: 0.6 % (ref 0.0–2.0)
BASOS ABS: 0 10*3/uL (ref 0.0–0.1)
EOS ABS: 0.2 10*3/uL (ref 0.0–0.5)
EOS%: 4.4 % (ref 0.0–7.0)
HCT: 32.9 % — ABNORMAL LOW (ref 38.4–49.9)
HEMOGLOBIN: 10.7 g/dL — AB (ref 13.0–17.1)
LYMPH%: 23.5 % (ref 14.0–49.0)
MCH: 29.2 pg (ref 27.2–33.4)
MCHC: 32.5 g/dL (ref 32.0–36.0)
MCV: 89.7 fL (ref 79.3–98.0)
MONO#: 1.1 10*3/uL — AB (ref 0.1–0.9)
MONO%: 21.7 % — AB (ref 0.0–14.0)
NEUT#: 2.6 10*3/uL (ref 1.5–6.5)
NEUT%: 49.8 % (ref 39.0–75.0)
PLATELETS: 150 10*3/uL (ref 140–400)
RBC: 3.67 10*6/uL — ABNORMAL LOW (ref 4.20–5.82)
RDW: 15.4 % — AB (ref 11.0–14.6)
WBC: 5.2 10*3/uL (ref 4.0–10.3)
lymph#: 1.2 10*3/uL (ref 0.9–3.3)

## 2016-08-07 LAB — COMPREHENSIVE METABOLIC PANEL
ALBUMIN: 3.4 g/dL — AB (ref 3.5–5.0)
ALK PHOS: 95 U/L (ref 40–150)
ALT: 18 U/L (ref 0–55)
ANION GAP: 8 meq/L (ref 3–11)
AST: 20 U/L (ref 5–34)
BILIRUBIN TOTAL: 0.29 mg/dL (ref 0.20–1.20)
BUN: 16.3 mg/dL (ref 7.0–26.0)
CALCIUM: 9 mg/dL (ref 8.4–10.4)
CO2: 20 mEq/L — ABNORMAL LOW (ref 22–29)
Chloride: 105 mEq/L (ref 98–109)
Creatinine: 1 mg/dL (ref 0.7–1.3)
EGFR: 78 mL/min/{1.73_m2} — AB (ref >90)
GLUCOSE: 74 mg/dL (ref 70–140)
POTASSIUM: 4.3 meq/L (ref 3.5–5.1)
SODIUM: 134 meq/L — AB (ref 136–145)
TOTAL PROTEIN: 6.9 g/dL (ref 6.4–8.3)

## 2016-08-07 NOTE — Telephone Encounter (Signed)
Pt called in and said that he needs this refill today because he is going out of town [employer] - [facility] on file

## 2016-08-08 ENCOUNTER — Encounter: Payer: Self-pay | Admitting: Oncology

## 2016-08-08 LAB — PROTEIN ELECTROPHORESIS, SERUM
A/G RATIO SPE: 1.1 (ref 0.7–1.7)
ALBUMIN: 3.5 g/dL (ref 2.9–4.4)
ALPHA 1: 0.2 g/dL (ref 0.0–0.4)
Alpha 2: 0.8 g/dL (ref 0.4–1.0)
Beta: 1.2 g/dL (ref 0.7–1.3)
Gamma Globulin: 0.9 g/dL (ref 0.4–1.8)
Globulin, Total: 3.2 g/dL (ref 2.2–3.9)
TOTAL PROTEIN: 6.7 g/dL (ref 6.0–8.5)

## 2016-08-08 LAB — KAPPA/LAMBDA LIGHT CHAINS
IG KAPPA FREE LIGHT CHAIN: 35.8 mg/L — AB (ref 3.3–19.4)
IG LAMBDA FREE LIGHT CHAIN: 20.6 mg/L (ref 5.7–26.3)
KAPPA/LAMBDA FLC RATIO: 1.74 — AB (ref 0.26–1.65)

## 2016-08-08 LAB — PROTEIN / CREATININE RATIO, URINE: CREATININE, UR: 27 mg/dL

## 2016-08-11 ENCOUNTER — Other Ambulatory Visit: Payer: Commercial Managed Care - PPO

## 2016-08-11 ENCOUNTER — Ambulatory Visit: Payer: Commercial Managed Care - PPO

## 2016-08-14 ENCOUNTER — Other Ambulatory Visit: Payer: Self-pay

## 2016-08-14 MED ORDER — POMALIDOMIDE 1 MG PO CAPS
1.0000 mg | ORAL_CAPSULE | Freq: Every day | ORAL | 0 refills | Status: DC
Start: 2016-08-14 — End: 2016-09-13

## 2016-08-14 MED ORDER — POMALIDOMIDE 1 MG PO CAPS: 1.0000 mg | ORAL_CAPSULE | Freq: Every day | ORAL | 0 refills | Status: DC

## 2016-08-14 NOTE — Telephone Encounter (Signed)
Pomalyst - Cycle 2 start 12/4.  [employer] auth # obtained - refill escribed to [place].    LMOVM  - pt with [facility] # to arrange delivery.  Pt to return call to clinic with any questions.

## 2016-08-15 ENCOUNTER — Other Ambulatory Visit (HOSPITAL_BASED_OUTPATIENT_CLINIC_OR_DEPARTMENT_OTHER): Payer: Commercial Managed Care - PPO

## 2016-08-15 ENCOUNTER — Ambulatory Visit (HOSPITAL_BASED_OUTPATIENT_CLINIC_OR_DEPARTMENT_OTHER): Payer: Commercial Managed Care - PPO

## 2016-08-15 ENCOUNTER — Ambulatory Visit (HOSPITAL_BASED_OUTPATIENT_CLINIC_OR_DEPARTMENT_OTHER): Payer: Commercial Managed Care - PPO | Admitting: Oncology

## 2016-08-15 VITALS — BP 136/66 | HR 62 | Temp 97.6°F | Resp 18 | Ht 69.0 in | Wt 187.5 lb

## 2016-08-15 DIAGNOSIS — C9000 Multiple myeloma not having achieved remission: Secondary | ICD-10-CM

## 2016-08-15 DIAGNOSIS — C9002 Multiple myeloma in relapse: Secondary | ICD-10-CM | POA: Diagnosis not present

## 2016-08-15 DIAGNOSIS — Z5112 Encounter for antineoplastic immunotherapy: Secondary | ICD-10-CM | POA: Diagnosis not present

## 2016-08-15 DIAGNOSIS — Z9481 Bone marrow transplant status: Secondary | ICD-10-CM

## 2016-08-15 LAB — CBC WITH DIFFERENTIAL/PLATELET
BASO%: 0.7 % (ref 0.0–2.0)
Basophils Absolute: 0 10*3/uL (ref 0.0–0.1)
EOS%: 6.7 % (ref 0.0–7.0)
Eosinophils Absolute: 0.3 10*3/uL (ref 0.0–0.5)
HEMATOCRIT: 34 % — AB (ref 38.4–49.9)
HEMOGLOBIN: 11.2 g/dL — AB (ref 13.0–17.1)
LYMPH#: 1.5 10*3/uL (ref 0.9–3.3)
LYMPH%: 37.9 % (ref 14.0–49.0)
MCH: 28.9 pg (ref 27.2–33.4)
MCHC: 32.9 g/dL (ref 32.0–36.0)
MCV: 87.9 fL (ref 79.3–98.0)
MONO#: 0.8 10*3/uL (ref 0.1–0.9)
MONO%: 20.3 % — ABNORMAL HIGH (ref 0.0–14.0)
NEUT#: 1.4 10*3/uL — ABNORMAL LOW (ref 1.5–6.5)
NEUT%: 34.4 % — ABNORMAL LOW (ref 39.0–75.0)
Platelets: 112 10*3/uL — ABNORMAL LOW (ref 140–400)
RBC: 3.87 10*6/uL — ABNORMAL LOW (ref 4.20–5.82)
RDW: 15.9 % — AB (ref 11.0–14.6)
WBC: 4 10*3/uL (ref 4.0–10.3)

## 2016-08-15 LAB — COMPREHENSIVE METABOLIC PANEL
ALBUMIN: 3.5 g/dL (ref 3.5–5.0)
ALK PHOS: 96 U/L (ref 40–150)
ALT: 21 U/L (ref 0–55)
AST: 22 U/L (ref 5–34)
Anion Gap: 7 mEq/L (ref 3–11)
BUN: 19.1 mg/dL (ref 7.0–26.0)
CALCIUM: 9.5 mg/dL (ref 8.4–10.4)
CHLORIDE: 106 meq/L (ref 98–109)
CO2: 22 mEq/L (ref 22–29)
CREATININE: 1.1 mg/dL (ref 0.7–1.3)
EGFR: 72 mL/min/{1.73_m2} — ABNORMAL LOW (ref >90)
Glucose: 74 mg/dl (ref 70–140)
Potassium: 4.6 mEq/L (ref 3.5–5.1)
Sodium: 136 mEq/L (ref 136–145)
Total Bilirubin: 0.28 mg/dL (ref 0.20–1.20)
Total Protein: 6.9 g/dL (ref 6.4–8.3)

## 2016-08-15 MED ORDER — ZOLEDRONIC ACID 4 MG/5ML IV CONC: 4.0000 mg | Freq: Once | INTRAVENOUS | Status: DC

## 2016-08-15 MED ORDER — PROCHLORPERAZINE MALEATE 10 MG PO TABS
ORAL_TABLET | ORAL | Status: AC
  Filled 2016-08-15: qty 1

## 2016-08-15 MED ORDER — BORTEZOMIB CHEMO SQ INJECTION 3.5 MG (2.5MG/ML)
1.3000 mg/m2 | Freq: Once | INTRAMUSCULAR | Status: AC
  Administered 2016-08-15: 2.5 mg via SUBCUTANEOUS
  Filled 2016-08-15: qty 2.5

## 2016-08-15 MED ORDER — POMALIDOMIDE 1 MG PO CAPS: 1.0000 mg | ORAL_CAPSULE | Freq: Every day | ORAL | 0 refills | Status: DC

## 2016-08-15 MED ORDER — SODIUM CHLORIDE 0.9 % IV SOLN: Freq: Once | INTRAVENOUS | Status: DC

## 2016-08-15 MED ORDER — PROCHLORPERAZINE MALEATE 10 MG PO TABS
10.0000 mg | ORAL_TABLET | Freq: Once | ORAL | Status: AC
  Administered 2016-08-15: 10 mg via ORAL

## 2016-08-15 NOTE — Progress Notes (Signed)
Ok to treat per Dr. [name] with [facility] 1.4

## 2016-08-15 NOTE — Patient Instructions (Signed)
Neutropenia Introduction Neutropenia is a condition that occurs when you have a lower-than-normal level of a type of white blood cell (neutrophil) in your body. Neutrophils are made in the spongy center of large bones (bone marrow) and they fight infections. Neutrophils are your body's main defense against bacterial and fungal infections. The fewer neutrophils you have and the longer your body remains without them, the greater your risk of getting a severe infection. What are the causes? This condition can occur if your body uses up or destroys neutrophils faster than your bone marrow can make them. This problem may happen because of:  Bacterial or fungal infection.  Allergic disorders.  Reactions to some medicines.  Autoimmune disease.  An enlarged spleen. This condition can also occur if your bone marrow does not produce enough neutrophils. This problem may be caused by:  Cancer.  Cancer treatments, such as radiation or chemotherapy.  Viral infections.  Medicines, such as phenytoin.  Vitamin B12 deficiency.  Diseases of the bone marrow.  Environmental toxins, such as insecticides. What are the signs or symptoms? This condition does not usually cause symptoms. If symptoms are present, they are usually caused by an underlying infection. Symptoms of an infection may include:  Fever.  Chills.  Swollen glands.  Oral or anal ulcers.  Cough and shortness of breath.  Rash.  Skin infection.  Fatigue. How is this diagnosed? Your health care provider may suspect neutropenia if you have:  A condition that may cause neutropenia.  Symptoms of infection, especially fever.  Frequent and unusual infections. You will have a medical history and physical exam. Tests will also be done, such as:  A complete blood count (CBC).  A procedure to collect a sample of bone marrow for examination (bone marrow biopsy).  A chest X-ray.  A urine culture.  A blood culture. How is  this treated? Treatment depends on the underlying cause and severity of your condition. Mild neutropenia may not require treatment. Treatment may include medicines, such as:  Antibiotic medicine given through an IV tube.  Antiviral medicines.  Antifungal medicines.  A medicine to increase neutrophil production (colony-stimulating factor). You may get this drug through an IV tube or by injection.  Steroids given through an IV tube. If an underlying condition is causing neutropenia, you may need treatment for that condition. If medicines you are taking are causing neutropenia, your health care provider may have you stop taking those medicines. Follow these instructions at home: Medicines  Take over-the-counter and prescription medicines only as told by your health care provider.  Get a seasonal flu shot (influenza vaccine). Lifestyle  Do not eat unpasteurized foods.Do not eat unwashed raw fruits or vegetables.  Avoid exposure to groups of people or children.  Avoid being around people who are sick.  Avoid being around dirt or dust, such as in construction areas or gardens.  Do not provide direct care for pets. Avoid animal droppings. Do not clean litter boxes and bird cages. Hygiene   Bathe daily.  Clean the area between the genitals and the anus (perineal area) after you urinate or have a bowel movement. If you are male, wipe from front to back.  Brush your teeth with a soft toothbrush before and after meals.  Do not use a razor that has a blade. Use an electric razor to remove hair.  Wash your hands often. Make sure others who come in contact with you also wash their hands. If soap and water are not available, use hand   sanitizer. General instructions  Do not have sex unless your health care provider has approved.  Take actions to avoid cuts and burns. For example:  Be cautious when you use knives. Always cut away from yourself.  Keep knives in protective sheaths or  guards when not in use.  Use oven mitts when you cook with a hot stove, oven, or grill.  Stand a safe distance away from open fires.  Avoid people who received a vaccine in the past 30 days if that vaccine contained a live version of the germ (live vaccine). You should not get a live vaccine. Common live vaccines are varicella, measles, mumps, and rubella.  Do not share food utensils.  Do not use tampons, enemas, or rectal suppositories unless your health care provider has approved.  Keep all appointments as told by your health care provider. This is important. Contact a health care provider if:  You have a fever.  You have chills or you start to shake.  You have:  A sore throat.  A warm, red, or tender area on your skin.  A cough.  Frequent or painful urination.  Vaginal discharge or itching.  You develop:  Sores in your mouth or anus.  Swollen lymph nodes.  Red streaks on the skin.  A rash.  You feel:  Nauseous or you vomit.  Very fatigued.  Short of breath. This information is not intended to replace advice given to you by your health care provider. Make sure you discuss any questions you have with your health care provider. Document Released: 02/24/2002 Document Revised: 02/10/2016 Document Reviewed: 03/17/2015  2017 [place] Discharge Instructions for Patients Receiving Chemotherapy  Today you received the following chemotherapy agents Velcade & Zometa  To help prevent nausea and vomiting after your treatment, we encourage you to take your nausea medication as directed.  If you develop nausea and vomiting that is not controlled by your nausea medication, call the clinic.   BELOW ARE SYMPTOMS THAT SHOULD BE REPORTED IMMEDIATELY:  *FEVER GREATER THAN 100.5 F  *CHILLS WITH OR WITHOUT FEVER  NAUSEA AND VOMITING THAT IS NOT CONTROLLED WITH YOUR NAUSEA MEDICATION  *UNUSUAL SHORTNESS OF BREATH  *UNUSUAL BRUISING OR  BLEEDING  TENDERNESS IN MOUTH AND THROAT WITH OR WITHOUT PRESENCE OF ULCERS  *URINARY PROBLEMS  *BOWEL PROBLEMS  UNUSUAL RASH Items with * indicate a potential emergency and should be followed up as soon as possible.  Feel free to call the clinic you have any questions or concerns. The clinic phone number is [phone].  Please show the [place] at check-in to the Emergency Department and triage nurse.

## 2016-08-15 NOTE — Progress Notes (Signed)
ID: DENE NAZIR   DOB: [birth year]  MR#: [id number]  FFM#:[id number]   PCP: Andre [name] SU: OTHER MD: Andre [name], Andre [name], [address] [place] [phone]), Andre [name]  CHIEF COMPLAINT: Multiple myeloma  CURRENT TREATMENT: Maintenance therapy with bortezomib, zolendronate, and pomalidomide  INTERVAL HISTORY:  Andre [name] returns today for follow-up of his multiple myeloma accompanied by his wife Andre [name]. He met with his [place] in September and they recommended we start maintenance therapy. This was started earlier this month. He has just finished a cycle of pomalidomide, which he is tolerating well at 1 mg per day. He does describe mild to moderate fatigue as the only side effect. He has had no peripheral neuropathy from that or from the bortezomib which he receives every 14 days. Today will be his third dose of that medication. He is now receiving zolendronate monthly instead of every 3 months. He has no side effects from that.   REVIEW OF SYSTEMS: Andre [name] continues to have intermittent problems with constipation, which is a chronic issue with him. He never quite gets it well regulated. Once he starts an effective bowel prophylaxis program he goes "the other way" and then stops and gets constipated again. He has had mild dizziness, but no nausea or vomiting, no double vision or blackouts, no passing out, no falls, and no gait imbalance. He admits to mild sinus symptoms and some ringing in his ears. He has had mouth sores in the past but not currently. He has moderate heartburn. He had an episode of blood in the semen, very scant, and this has not recurred. He has some dry skin particularly over the lower legs which can be achieved. He gets rare headaches. He feels forgetful and somewhat depressed. He does continue to work full time. He tried some yoga but "it was too advanced", and mostly he tries to do some walking and uses a recumbent bike. Overall he  detailed review of systems today was stable  HISTORY OF PRESENT ILLNESS: From the original intake nodes:  The patient was worked up for peptic ulcer disease in August of 2011, with significant bleeding and anemia.  The patient was Helicobacter pylori negative.  He received some epinephrine when he had his EGD and then started on Protonix.  [name] anemia slowly resolved so that by September, his hemoglobin was up to 10.9 and by earlier this month, his hemoglobin was up to 12.5.    As part of his general workup, he was found to have a slightly elevated globulin fraction.  In September, his total protein was 8.3 with an albumin of 3.8.  In January, the total protein was 8.4 and albumin 3.6.  With persistence of this slight abnormality, Dr. Olevia [name] obtained serum immunofixation and SPEP.  The SPTP showed an M-spike of 2.67 grams.  A total IgG was 4,190.  Total IgA low at 28.  Total IgM low at 28 also.  The immunofixation showed a monoclonal IgG lambda paraprotein.  There were also monoclonal free lambda light chains present. With this information, the patient was referred for further evaluation.   A diagnosis of myeloma was confirmed by bone marrow biopsy and subsequebnt treatment is as detailed below  PAST MEDICAL HISTORY: Past Medical History:  Diagnosis Date  . Allergy   . Anemia   . Arthritis   . Asthma    no treatment x 20 years  . Depression   . Double vision    occurs at times   .  Duodenal ulcer   . GERD (gastroesophageal reflux disease)   . Hyperlipidemia   . Hypertension   . Hypothyroidism   . Multiple myeloma 07/04/2011  . Thyroid disease   Significant for peptic ulcer disease as noted above.  History of hyperlipidemia.  History of anxiety and depression.  History of GERD which is significantly improved with weight loss and history of reactive airway disease, possibly secondary to the GERD which also improved with weight loss.  There was a history of obesity, now much  improved secondary to exercise and diet.   PAST SURGICAL HISTORY: Past Surgical History:  Procedure Laterality Date  . BONE MARROW TRANSPLANT  2011   for MM  . CARDIOLITE STUDY  11/25/2003   NORMAL  . TONSILLECTOMY      FAMILY HISTORY Family History  Problem Relation Age of Onset  . Throat cancer Mother   . Hypertension Father   . Stroke Father   . Asthma Father   . Diabetes Father   . Pancreatic cancer Brother   [name] father died at the age of 81 following a stroke.  [name] mother died with cancer of the throat at the age of 31.   She had a history of depression and was a smoker.  Not clear how much alcohol she drank, according to the patient.  The patient had two brothers.  One died from the age of 69 from a "kidney problem."  The second one died at the age of 88 from pancreatic cancer. (This may have been duodenal cancer. 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  His wife of 37+ years was Andre [name], but they are now divorced. In 2016 he remarried, his current wife's name is Andre [name].  The patient has two daughters:  Andre [name], who works in [place], who is expecting her second child April 2016. .  They both live here in [place]. 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  The patient is very close to his daughters.  [name] first daughter, Andre [name], born 03/06/2013, apparently has Weber/Osler/Rendu. The patient is not a church attender.  He does derive a great deal of support from friends at the "[employer]" which he attends very regularly, and also participates in Al-Anon even though actually there is no alcohol or drug problem in the family.  He participates in this group and he gets quite a bit of support from it.     ADVANCED DIRECTIVES: In place  HEALTH  MAINTENANCE: (updated 07/16/2013) Social History  Substance Use Topics  . Smoking status: Former Smoker    Years: 3.00    Quit date: 03/27/1969  . Smokeless tobacco: Never Used  . Alcohol use No     Colonoscopy:  PSA:  Sept 2014, normal/Dr. [name]  Lipid panel: Sept 2014/Dr. [name]   Allergies  Allergen Reactions  . Atorvastatin Other (See Comments)  . Rosuvastatin     Other reaction(s): Liver Disorder  . Crestor [Rosuvastatin Calcium]     ADVERSE EFFECTS ON LIVER  . Lipitor [Atorvastatin Calcium]   . Septra [Sulfamethoxazole-Trimethoprim] Rash    Current Outpatient Prescriptions  Medication Sig Dispense Refill  . aspirin 325 MG tablet Take 325 mg by mouth daily.    . cetirizine (ZYRTEC) 10 MG tablet Take 10 mg by mouth daily.    [name]  docusate sodium (COLACE) 100 MG capsule Take 100 mg by mouth daily as needed for mild constipation or moderate constipation.    [name] escitalopram (LEXAPRO) 10 MG tablet take 1 tablet by mouth once daily 30 tablet 1  . feeding supplement, ENSURE ENLIVE, (ENSURE ENLIVE) LIQD Take 237 mLs by mouth 2 (two) times daily between meals. 237 mL 12  . levothyroxine (SYNTHROID, LEVOTHROID) 150 MCG tablet take 1 tablet by mouth every morning ON AN EMPTY STOMACH 90 tablet 1  . pantoprazole (PROTONIX) 40 MG tablet take 1 tablet by mouth twice a day before meals 60 tablet 3  . pomalidomide (POMALYST) 1 MG capsule Take 1 capsule (1 mg total) by mouth daily. Take with water on days 1-21. Repeat every 28 days. 21 capsule 0  . pomalidomide (POMALYST) 1 MG capsule Take 1 capsule (1 mg total) by mouth daily. Take with water on days 1-21. Repeat every 28 days. 21 capsule 0  . ranitidine (ZANTAC) 150 MG tablet Take 150 mg by mouth 2 (two) times daily.    . sildenafil (VIAGRA) 25 MG tablet Take 1 tablet (25 mg total) by mouth daily as needed for erectile dysfunction. 10 tablet 0  . valACYclovir (VALTREX) 1000 MG tablet Take 1 tablet (1,000 mg total) by mouth daily.  (Patient taking differently: Take 500 mg by mouth daily. ) 60 tablet 6  . zolendronic acid (ZOMETA) 4 MG/5ML injection Inject 4 mg into the vein every 3 (three) months.    . zolpidem (AMBIEN) 5 MG tablet Take 1-2 tablets (5-10 mg total) by mouth at bedtime as needed for sleep. 30 tablet 1   No current facility-administered medications for this visit.    Facility-Administered Medications Ordered in Other Visits  Medication Dose Route Frequency Provider Last Rate Last Dose  . 0.9 %  sodium chloride infusion   Intravenous Once Chauncey Cruel, MD        Objective: Middle-aged white man who appears stated age    BP: 136/66  Pulse: 62  Resp: 18  Temp: 97.6 F (36.4 C)   Body mass index is 27.69 kg/m.  Filed Weights   08/15/16 0954  Weight: 187 lb 8 oz (85 kg)   Sclerae unicteric, pupils round and equal Oropharynx clear and moist-- no thrush or other lesions No cervical or supraclavicular adenopathy Lungs no rales or rhonchi Heart regular rate and rhythm Abd soft, nontender, positive bowel sounds MSK no focal spinal tenderness Neuro: nonfocal, well oriented, appropriate affect  LAB RESULTS:     Ref Range & Units 8d ago (08/07/16) 67moago (07/11/16) 128mogo (07/11/16) 8m68moo (06/19/16)   Total Protein 6.0 - 8.5 g/dL 6.7  6.2      Albumin 2.9 - 4.4 g/dL 3.5  3.3  3.4R   3.4R     Alpha 1 0.0 - 0.4 g/dL 0.2  0.3      Alpha 2 0.4 - 1.0 g/dL 0.8  0.7      Beta 0.7 - 1.3 g/dL 1.2  1.1      Gamma Globulin 0.4 - 1.8 g/dL 0.9  0.8      M-Spike, % Not Observed g/dL Not Obs...  Not Obs...     Not Observed   GLOBULIN, TOTAL 2.2 - 3.9 g/dL 3.2  2.9      A/G Ratio 0.7 - 1.7 1.1  1.1      Please Note:  Comment  CommentCM          Ref Range &  Units 8d ago 74moago 217mogo 36m20moo    Ig Kappa Free Light Chain 3.3 - 19.4 mg/L 35.8   22.0   21.4   22.7     Ig Lambda Free Light Chain 5.7 - 26.3 mg/L 20.6  13.3  19.2  24.6    Kappa/Lambda FluidC Ratio 0.26 - 1.65 1.74   1.65  1.11   0.92      Lab Results  Component Value Date   WBC 4.0 08/15/2016   NEUTROABS 1.4 (L) 08/15/2016   HGB 11.2 (L) 08/15/2016   HCT 34.0 (L) 08/15/2016   MCV 87.9 08/15/2016   PLT 112 (L) 08/15/2016      Chemistry      Component Value Date/Time   NA 136 08/15/2016 0932   K 4.6 08/15/2016 0932   CL 105 02/28/2016 0318   CL 107 03/10/2013 1352   CO2 22 08/15/2016 0932   BUN 19.1 08/15/2016 0932   CREATININE 1.1 08/15/2016 0932      Component Value Date/Time   CALCIUM 9.5 08/15/2016 0932   ALKPHOS 96 08/15/2016 0932   AST 22 08/15/2016 0932   ALT 21 08/15/2016 0932   BILITOT 0.28 08/15/2016 0932     STUDIES: No results found.   ASSESSMENT: 65 48o.  [place] man with a history of multiple myeloma diagnosed February of 2012, with an initial M-spike of 2.6 g/dL, IFE showing an IgG lambda paraprotein and free lambda light chains in the urine. Cytogenetics showed trisomy 11.16one marrow biopsy showed a 22% plasmacytosis. Treated with   (1) bortezomib (subcutaneously), lenalidomide, and dexamethasone, with repeat bone marrow biopsy May of 2012 showing 10% plasmacytosis   (2) [facility]   (3) on zoledronic acid started December of 2012, initially monthly, currently given every 3 months, most recent dose  12/07/2015  (4) low-dose lenalidomide resumed April 2013, interrupted several times.  Resumed again on 02/19/2013, ata dose of 5 mg daily, 21 days on and 7 days off, later further reduced to 7 days on, 7 days off  (5) CNS symptoms and abnormal brain MRI extensively evaluated by neurology with no definitive diagnosis established  (6) rising M spike noted June 2014 but did not meet criteria for carfilzomib study  (7) on lenalidomide 25 mg daily, 14 days on, 7 days off, starting 04/18/2013, interrupted December 2014 because of rash;   (a) resumed January 2015 at 10 mg/ day at 21 days on/ 7 days  off  ([place]) starting 08/18/2014 decreased to 10 mg/ day 14 days on, 7 days off because of cytopenias  (c) as of February 2016 was on 5 mg daily 7 days on 7 days off  (d) lenalidomide discontinued December 2016 with evidence of disease progression  (8) transient global amnesia 05/29/2015, resolved without intervention  (9) starting PVD 10/25/2015 w ASA 325 thromboprophylaxis, valacyclovir prophylaxis, last dose 12/17/2015  (a) pomalidomide 4 mg/d days 1-14  ([place]ie) bortezomib sQ days 2,5,9,12 of each 21 day cycle  (c) dexamethasome 20 mg two days a week  (10) metapneumovirus pneumonia April 2017  (a) completing course of steroids and week of bactrim mid April 2017  (11) status [facility](preparatory regimen melphalan 200 mg/m)  (12) maintenance therapy started November 2017, consisting of  (a) bortezomib 1.3 mg/M2 QOW, first dose 07/27/2016  ([place]) pomalidomide 1 mg days 1-21 Q28 days (increase to 2 mg as tolerated)  started 07/19/2016  (c) zolendronate monthly started 07/27/2016 (previously Q 3 months)  PLAN:  Andre [name] is tolerating his maintenance therapy generally well in though there have been some drops in his counts they are acceptable. Accordingly we are proceeding with bortezomib today.  He is planning to be in [place] for approximately 6 weeks beginning early January. Once as his date she will let me know and I can fix him up with a hematologist in the area the year who can continue his treatments without interruption.  It would be useful for him to have 2 months supply of pomalidomide so he does not have to have stuff mailed while he is in [place]. We will try to arrange all that for him.  He was concerned that the [place] lab work did show a monoclonal protein. That was in the IFE, which we have not been following. I will add that to his next set of labs so we can see the comparison. Also there has been a slight bump in the kappa/lambda ratio. We will  continue to follow that for any trends.   Otherwise we are proceeding with treatment as before. I normally see him on a monthly basis which would put [place] into January. Of course if he is out of town and then we will reschedule that visit.    Chauncey Cruel, MD 08/15/2016 t

## 2016-08-16 ENCOUNTER — Other Ambulatory Visit (INDEPENDENT_AMBULATORY_CARE_PROVIDER_SITE_OTHER): Payer: Commercial Managed Care - PPO

## 2016-08-16 ENCOUNTER — Ambulatory Visit (INDEPENDENT_AMBULATORY_CARE_PROVIDER_SITE_OTHER): Payer: Commercial Managed Care - PPO | Admitting: Internal Medicine

## 2016-08-16 ENCOUNTER — Encounter: Payer: Self-pay | Admitting: Internal Medicine

## 2016-08-16 ENCOUNTER — Ambulatory Visit: Payer: Commercial Managed Care - PPO | Admitting: Oncology

## 2016-08-16 VITALS — BP 130/90 | HR 63 | Temp 97.5°F | Ht 69.0 in | Wt 186.0 lb

## 2016-08-16 DIAGNOSIS — L853 Xerosis cutis: Secondary | ICD-10-CM | POA: Diagnosis not present

## 2016-08-16 DIAGNOSIS — Z0000 Encounter for general adult medical examination without abnormal findings: Secondary | ICD-10-CM | POA: Diagnosis not present

## 2016-08-16 DIAGNOSIS — E291 Testicular hypofunction: Secondary | ICD-10-CM

## 2016-08-16 DIAGNOSIS — F329 Major depressive disorder, single episode, unspecified: Secondary | ICD-10-CM | POA: Diagnosis not present

## 2016-08-16 DIAGNOSIS — Z9481 Bone marrow transplant status: Secondary | ICD-10-CM

## 2016-08-16 DIAGNOSIS — C9000 Multiple myeloma not having achieved remission: Secondary | ICD-10-CM

## 2016-08-16 DIAGNOSIS — E039 Hypothyroidism, unspecified: Secondary | ICD-10-CM

## 2016-08-16 DIAGNOSIS — F5101 Primary insomnia: Secondary | ICD-10-CM

## 2016-08-16 LAB — URINALYSIS, ROUTINE W REFLEX MICROSCOPIC
Bilirubin Urine: NEGATIVE
Ketones, ur: NEGATIVE
Leukocytes, UA: NEGATIVE
Nitrite: NEGATIVE
Specific Gravity, Urine: 1.015
Total Protein, Urine: NEGATIVE
Urine Glucose: NEGATIVE
Urobilinogen, UA: 0.2
pH: 5.5 (ref 5.0–8.0)

## 2016-08-16 LAB — LIPID PANEL
Cholesterol: 197 mg/dL (ref 0–200)
HDL: 39.6 mg/dL
LDL Cholesterol: 127 mg/dL — ABNORMAL HIGH (ref 0–99)
NonHDL: 157.73
Total CHOL/HDL Ratio: 5
Triglycerides: 155 mg/dL — ABNORMAL HIGH (ref 0.0–149.0)
VLDL: 31 mg/dL (ref 0.0–40.0)

## 2016-08-16 LAB — VITAMIN B12: Vitamin B-12: 344 pg/mL (ref 211–911)

## 2016-08-16 LAB — TSH: TSH: 0.37 u[IU]/mL (ref 0.35–4.50)

## 2016-08-16 LAB — PSA: PSA: 0.64 ng/mL (ref 0.10–4.00)

## 2016-08-16 MED ORDER — TRIAMCINOLONE ACETONIDE 0.5 % EX CREA: 1.0000 "application " | TOPICAL_CREAM | Freq: Three times a day (TID) | CUTANEOUS | 2 refills | Status: AC

## 2016-08-16 NOTE — Assessment & Plan Note (Signed)
On Levothroid Labs 

## 2016-08-16 NOTE — Progress Notes (Signed)
Pre visit review using our clinic review tool, if applicable. No additional management support is needed unless otherwise documented below in the visit note. 

## 2016-08-16 NOTE — Assessment & Plan Note (Signed)
[facility] BMT 2017 Dr [name]

## 2016-08-16 NOTE — Assessment & Plan Note (Signed)
Zolpidem prn - rare  Potential benefits of a long term benzodiazepines  use as well as potential risks  and complications were explained to the patient and were aknowledged. 

## 2016-08-16 NOTE — Assessment & Plan Note (Signed)
Not on Rx 

## 2016-08-16 NOTE — Assessment & Plan Note (Signed)
Kenalog cream prn 

## 2016-08-16 NOTE — Progress Notes (Signed)
Subjective:  Patient ID: Andre [name], male    DOB: 1951-07-15  Age: 65 y.o. MRN: [id number]  CC: Annual Exam   HPI CADYN SHADOWENS presents for a well exam. He had a bone marrow transplant in 2017 at [facility]  Outpatient Medications Prior to Visit  Medication Sig Dispense Refill  . aspirin 325 MG tablet Take 325 mg by mouth daily.    . cetirizine (ZYRTEC) 10 MG tablet Take 10 mg by mouth daily.    [name] docusate sodium (COLACE) 100 MG capsule Take 100 mg by mouth daily as needed for mild constipation or moderate constipation.    [name] escitalopram (LEXAPRO) 10 MG tablet take 1 tablet by mouth once daily 30 tablet 1  . feeding supplement, ENSURE ENLIVE, (ENSURE ENLIVE) LIQD Take 237 mLs by mouth 2 (two) times daily between meals. 237 mL 12  . levothyroxine (SYNTHROID, LEVOTHROID) 150 MCG tablet take 1 tablet by mouth every morning ON AN EMPTY STOMACH 90 tablet 1  . pantoprazole (PROTONIX) 40 MG tablet take 1 tablet by mouth twice a day before meals 60 tablet 3  . pomalidomide (POMALYST) 1 MG capsule Take 1 capsule (1 mg total) by mouth daily. Take with water on days 1-21. Repeat every 28 days. 21 capsule 0  . pomalidomide (POMALYST) 1 MG capsule Take 1 capsule (1 mg total) by mouth daily. Take with water on days 1-21. Repeat every 28 days. 21 capsule 0  . ranitidine (ZANTAC) 150 MG tablet Take 150 mg by mouth 2 (two) times daily.    . sildenafil (VIAGRA) 25 MG tablet Take 1 tablet (25 mg total) by mouth daily as needed for erectile dysfunction. 10 tablet 0  . valACYclovir (VALTREX) 1000 MG tablet Take 1 tablet (1,000 mg total) by mouth daily. (Patient taking differently: Take 500 mg by mouth daily. ) 60 tablet 6  . zolendronic acid (ZOMETA) 4 MG/5ML injection Inject 4 mg into the vein every 3 (three) months.    . zolpidem (AMBIEN) 5 MG tablet Take 1-2 tablets (5-10 mg total) by mouth at bedtime as needed for sleep. 30 tablet 1   No facility-administered medications prior to visit.      ROS Review of Systems  Constitutional: Positive for fatigue. Negative for appetite change and unexpected weight change.  HENT: Negative for congestion, nosebleeds, sneezing, sore throat and trouble swallowing.   Eyes: Negative for itching and visual disturbance.  Respiratory: Negative for cough.   Cardiovascular: Negative for chest pain, palpitations and leg swelling.  Gastrointestinal: Negative for abdominal distention, blood in stool, diarrhea and nausea.  Genitourinary: Negative for frequency and hematuria.  Musculoskeletal: Negative for back pain, gait problem, joint swelling and neck pain.  Skin: Negative for rash.  Neurological: Positive for light-headedness. Negative for dizziness, tremors, speech difficulty and weakness.  Psychiatric/Behavioral: Negative for agitation, dysphoric mood, sleep disturbance and suicidal ideas. The patient is not nervous/anxious.     Objective:  BP 130/90   Pulse 63   Temp 97.5 F (36.4 C) (Oral)   Ht 5\' 9"  (1.753 m)   Wt 186 lb (84.4 kg)   SpO2 99%   BMI 27.47 kg/m   BP Readings from Last 3 Encounters:  08/16/16 130/90  08/15/16 136/66  07/27/16 (!) 141/89    Wt Readings from Last 3 Encounters:  08/16/16 186 lb (84.4 kg)  08/15/16 187 lb 8 oz (85 kg)  05/29/16 180 lb 3.2 oz (81.7 kg)    Physical Exam  Constitutional: He is oriented  to person, place, and time. He appears well-developed and well-nourished. No distress.  HENT:  Head: Normocephalic and atraumatic.  Right Ear: External ear normal.  Left Ear: External ear normal.  Nose: Nose normal.  Mouth/Throat: Oropharynx is clear and moist. No oropharyngeal exudate.  Eyes: Conjunctivae and EOM are normal. Pupils are equal, round, and reactive to light. Right eye exhibits no discharge. Left eye exhibits no discharge. No scleral icterus.  Neck: Normal range of motion. Neck supple. No JVD present. No tracheal deviation present. No thyromegaly present.  Cardiovascular: Normal rate,  regular rhythm, normal heart sounds and intact distal pulses.  Exam reveals no gallop and no friction rub.   No murmur heard. Pulmonary/Chest: Effort normal and breath sounds normal. No stridor. No respiratory distress. He has no wheezes. He has no rales. He exhibits no tenderness.  Abdominal: Soft. Bowel sounds are normal. He exhibits no distension and no mass. There is no tenderness. There is no rebound and no guarding.  Genitourinary: Rectum normal, prostate normal and penis normal. Rectal exam shows guaiac negative stool. No penile tenderness.  Musculoskeletal: Normal range of motion. He exhibits no edema or tenderness.  Lymphadenopathy:    He has no cervical adenopathy.  Neurological: He is alert and oriented to person, place, and time. He has normal reflexes. No cranial nerve deficit. He exhibits normal muscle tone. Coordination normal.  Skin: Skin is warm and dry. No rash noted. He is not diaphoretic. No erythema. No pallor.  Psychiatric: He has a normal mood and affect. His behavior is normal. Judgment and thought content normal.  dry skin on face and thighs  Lab Results  Component Value Date   WBC 4.0 08/15/2016   HGB 11.2 (L) 08/15/2016   HCT 34.0 (L) 08/15/2016   PLT 112 (L) 08/15/2016   GLUCOSE 74 08/15/2016   CHOL 181 07/08/2015   TRIG 191.0 (H) 07/08/2015   HDL 38.90 (L) 07/08/2015   LDLDIRECT 155.8 11/13/2012   LDLCALC 103 (H) 07/08/2015   ALT 21 08/15/2016   AST 22 08/15/2016   NA 136 08/15/2016   K 4.6 08/15/2016   CL 105 02/28/2016   CREATININE 1.1 08/15/2016   BUN 19.1 08/15/2016   CO2 22 08/15/2016   TSH 6.500 (H) 02/26/2016   PSA 0.70 07/08/2015   INR 1.07 02/27/2016   HGBA1C 5.4 03/27/2012    No results found.  Assessment & Plan:   There are no diagnoses linked to this encounter. I am having Mr. Shank maintain his cetirizine, valACYclovir, aspirin, zolpidem, zolendronic acid, ranitidine, docusate sodium, feeding supplement (ENSURE ENLIVE),  levothyroxine, sildenafil, pantoprazole, escitalopram, pomalidomide, and pomalidomide.  No orders of the defined types were placed in this encounter.    Follow-up: No Follow-up on file.  [name], MD

## 2016-08-16 NOTE — Assessment & Plan Note (Signed)
2017 [facility]

## 2016-08-16 NOTE — Assessment & Plan Note (Signed)
On Lexapro 

## 2016-08-29 ENCOUNTER — Ambulatory Visit (HOSPITAL_BASED_OUTPATIENT_CLINIC_OR_DEPARTMENT_OTHER): Payer: Commercial Managed Care - PPO

## 2016-08-29 ENCOUNTER — Other Ambulatory Visit (HOSPITAL_BASED_OUTPATIENT_CLINIC_OR_DEPARTMENT_OTHER): Payer: Commercial Managed Care - PPO

## 2016-08-29 VITALS — BP 140/78 | HR 58 | Temp 98.4°F | Resp 17

## 2016-08-29 DIAGNOSIS — Z9481 Bone marrow transplant status: Secondary | ICD-10-CM

## 2016-08-29 DIAGNOSIS — Z5112 Encounter for antineoplastic immunotherapy: Secondary | ICD-10-CM

## 2016-08-29 DIAGNOSIS — C9002 Multiple myeloma in relapse: Secondary | ICD-10-CM | POA: Diagnosis not present

## 2016-08-29 DIAGNOSIS — C9000 Multiple myeloma not having achieved remission: Secondary | ICD-10-CM

## 2016-08-29 LAB — CBC WITH DIFFERENTIAL/PLATELET
BASO%: 1.4 % (ref 0.0–2.0)
BASOS ABS: 0.1 10*3/uL (ref 0.0–0.1)
EOS ABS: 0.3 10*3/uL (ref 0.0–0.5)
EOS%: 7.9 % — ABNORMAL HIGH (ref 0.0–7.0)
HCT: 33.5 % — ABNORMAL LOW (ref 38.4–49.9)
HGB: 10.9 g/dL — ABNORMAL LOW (ref 13.0–17.1)
LYMPH%: 31.7 % (ref 14.0–49.0)
MCH: 28.9 pg (ref 27.2–33.4)
MCHC: 32.5 g/dL (ref 32.0–36.0)
MCV: 88.9 fL (ref 79.3–98.0)
MONO#: 0.4 10*3/uL (ref 0.1–0.9)
MONO%: 11.5 % (ref 0.0–14.0)
NEUT%: 47.5 % (ref 39.0–75.0)
NEUTROS ABS: 1.7 10*3/uL (ref 1.5–6.5)
Platelets: 140 10*3/uL (ref 140–400)
RBC: 3.77 10*6/uL — AB (ref 4.20–5.82)
RDW: 16.5 % — ABNORMAL HIGH (ref 11.0–14.6)
WBC: 3.7 10*3/uL — AB (ref 4.0–10.3)
lymph#: 1.2 10*3/uL (ref 0.9–3.3)

## 2016-08-29 LAB — COMPREHENSIVE METABOLIC PANEL
ALT: 21 U/L (ref 0–55)
AST: 21 U/L (ref 5–34)
Albumin: 3.4 g/dL — ABNORMAL LOW (ref 3.5–5.0)
Alkaline Phosphatase: 81 U/L (ref 40–150)
Anion Gap: 9 mEq/L (ref 3–11)
BUN: 22.8 mg/dL (ref 7.0–26.0)
CHLORIDE: 108 meq/L (ref 98–109)
CO2: 21 meq/L — AB (ref 22–29)
Calcium: 9 mg/dL (ref 8.4–10.4)
Creatinine: 1.2 mg/dL (ref 0.7–1.3)
EGFR: 65 mL/min/{1.73_m2} — AB (ref >90)
GLUCOSE: 90 mg/dL (ref 70–140)
POTASSIUM: 4.1 meq/L (ref 3.5–5.1)
SODIUM: 137 meq/L (ref 136–145)
Total Bilirubin: 0.37 mg/dL (ref 0.20–1.20)
Total Protein: 6.8 g/dL (ref 6.4–8.3)

## 2016-08-29 MED ORDER — PROCHLORPERAZINE MALEATE 10 MG PO TABS
10.0000 mg | ORAL_TABLET | Freq: Once | ORAL | Status: AC
  Administered 2016-08-29: 10 mg via ORAL

## 2016-08-29 MED ORDER — BORTEZOMIB CHEMO SQ INJECTION 3.5 MG (2.5MG/ML)
1.3000 mg/m2 | Freq: Once | INTRAMUSCULAR | Status: AC
  Administered 2016-08-29: 2.5 mg via SUBCUTANEOUS
  Filled 2016-08-29: qty 2.5

## 2016-08-29 MED ORDER — ZOLEDRONIC ACID 4 MG/100ML IV SOLN
4.0000 mg | Freq: Once | INTRAVENOUS | Status: AC
  Administered 2016-08-29: 4 mg via INTRAVENOUS
  Filled 2016-08-29: qty 100

## 2016-08-29 MED ORDER — PROCHLORPERAZINE MALEATE 10 MG PO TABS
ORAL_TABLET | ORAL | Status: AC
  Filled 2016-08-29: qty 1

## 2016-08-29 NOTE — Patient Instructions (Signed)
[facility] Discharge Instructions for Patients Receiving Chemotherapy  Today you received the following chemotherapy agents Velcade and Zometa  To help prevent nausea and vomiting after your treatment, we encourage you to take your nausea medication as directed    If you develop nausea and vomiting that is not controlled by your nausea medication, call the clinic.   BELOW ARE SYMPTOMS THAT SHOULD BE REPORTED IMMEDIATELY:  *FEVER GREATER THAN 100.5 F  *CHILLS WITH OR WITHOUT FEVER  NAUSEA AND VOMITING THAT IS NOT CONTROLLED WITH YOUR NAUSEA MEDICATION  *UNUSUAL SHORTNESS OF BREATH  *UNUSUAL BRUISING OR BLEEDING  TENDERNESS IN MOUTH AND THROAT WITH OR WITHOUT PRESENCE OF ULCERS  *URINARY PROBLEMS  *BOWEL PROBLEMS  UNUSUAL RASH Items with * indicate a potential emergency and should be followed up as soon as possible.  Feel free to call the clinic you have any questions or concerns. The clinic phone number is [phone].  Please show the CHEMO ALERT CARD at check-in to the Emergency Department and triage nurse.   

## 2016-08-30 LAB — KAPPA/LAMBDA LIGHT CHAINS
Ig Kappa Free Light Chain: 30.4 mg/L — ABNORMAL HIGH (ref 3.3–19.4)
Ig Lambda Free Light Chain: 19.5 mg/L (ref 5.7–26.3)
Kappa/Lambda FluidC Ratio: 1.56 (ref 0.26–1.65)

## 2016-08-30 LAB — PROTEIN / CREATININE RATIO, URINE
CREATININE, UR: 84.8 mg/dL
PROTEIN,TOTAL,URINE: 10 mg/dL
Protein/Creat Ratio: 118 mg/g creat (ref 0–200)

## 2016-08-31 ENCOUNTER — Other Ambulatory Visit: Payer: Self-pay | Admitting: General Practice

## 2016-08-31 MED ORDER — LEVOTHYROXINE SODIUM 150 MCG PO TABS: ORAL_TABLET | ORAL | 1 refills | Status: DC

## 2016-08-31 MED ORDER — ESCITALOPRAM OXALATE 10 MG PO TABS: 10.0000 mg | ORAL_TABLET | Freq: Every day | ORAL | 1 refills | Status: DC

## 2016-09-01 ENCOUNTER — Other Ambulatory Visit: Payer: Self-pay | Admitting: *Deleted

## 2016-09-01 ENCOUNTER — Encounter: Payer: Self-pay | Admitting: *Deleted

## 2016-09-01 LAB — PROTEIN ELECTROPHORESIS, SERUM
A/G RATIO SPE: 1.3 (ref 0.7–1.7)
ALBUMIN: 3.5 g/dL (ref 2.9–4.4)
ALPHA 1: 0.2 g/dL (ref 0.0–0.4)
Alpha 2: 0.7 g/dL (ref 0.4–1.0)
Beta: 1.1 g/dL (ref 0.7–1.3)
GAMMA GLOBULIN: 0.8 g/dL (ref 0.4–1.8)
Globulin, Total: 2.8 g/dL (ref 2.2–3.9)
TOTAL PROTEIN: 6.3 g/dL (ref 6.0–8.5)

## 2016-09-01 LAB — IMMUNOFIXATION ELECTROPHORESIS
IGA/IMMUNOGLOBULIN A, SERUM: 148 mg/dL (ref 61–437)
IGM (IMMUNOGLOBIN M), SRM: 48 mg/dL (ref 20–172)
IgG, Qn, Serum: 935 mg/dL (ref 700–1600)
Total Protein: 6.3 g/dL (ref 6.0–8.5)

## 2016-09-01 MED ORDER — VALACYCLOVIR HCL 1 G PO TABS: 1000.0000 mg | ORAL_TABLET | Freq: Every day | ORAL | 1 refills | Status: DC

## 2016-09-01 MED ORDER — PANTOPRAZOLE SODIUM 40 MG PO TBEC: DELAYED_RELEASE_TABLET | ORAL | 1 refills | Status: DC

## 2016-09-05 ENCOUNTER — Telehealth: Payer: Self-pay | Admitting: Internal Medicine

## 2016-09-05 MED ORDER — ESCITALOPRAM OXALATE 10 MG PO TABS: 10.0000 mg | ORAL_TABLET | Freq: Every day | ORAL | 0 refills | Status: DC

## 2016-09-05 NOTE — Telephone Encounter (Signed)
Patient is calling about lexapro. His mail supply will not arrive until 12/28. He is asking for a temporary fill be sent in to [facility] on [address]. Patient states he is out as of today

## 2016-09-05 NOTE — Telephone Encounter (Signed)
Notified pt sent in 2 week supply to [facility]...Andre [name]

## 2016-09-12 ENCOUNTER — Ambulatory Visit (HOSPITAL_BASED_OUTPATIENT_CLINIC_OR_DEPARTMENT_OTHER): Payer: Commercial Managed Care - PPO

## 2016-09-12 ENCOUNTER — Other Ambulatory Visit (HOSPITAL_BASED_OUTPATIENT_CLINIC_OR_DEPARTMENT_OTHER): Payer: Commercial Managed Care - PPO

## 2016-09-12 VITALS — BP 151/63 | HR 50 | Temp 97.7°F | Resp 16

## 2016-09-12 DIAGNOSIS — C9002 Multiple myeloma in relapse: Secondary | ICD-10-CM | POA: Diagnosis not present

## 2016-09-12 DIAGNOSIS — C9000 Multiple myeloma not having achieved remission: Secondary | ICD-10-CM

## 2016-09-12 DIAGNOSIS — Z5112 Encounter for antineoplastic immunotherapy: Secondary | ICD-10-CM

## 2016-09-12 DIAGNOSIS — Z9481 Bone marrow transplant status: Secondary | ICD-10-CM

## 2016-09-12 LAB — COMPREHENSIVE METABOLIC PANEL
ALBUMIN: 3.5 g/dL (ref 3.5–5.0)
ALK PHOS: 84 U/L (ref 40–150)
ALT: 24 U/L (ref 0–55)
ANION GAP: 5 meq/L (ref 3–11)
AST: 23 U/L (ref 5–34)
BUN: 19.5 mg/dL (ref 7.0–26.0)
CALCIUM: 9.2 mg/dL (ref 8.4–10.4)
CO2: 23 mEq/L (ref 22–29)
CREATININE: 1 mg/dL (ref 0.7–1.3)
Chloride: 108 mEq/L (ref 98–109)
EGFR: 74 mL/min/{1.73_m2} — ABNORMAL LOW (ref >90)
Glucose: 78 mg/dl (ref 70–140)
POTASSIUM: 4.7 meq/L (ref 3.5–5.1)
Sodium: 136 mEq/L (ref 136–145)
Total Bilirubin: 0.37 mg/dL (ref 0.20–1.20)
Total Protein: 6.8 g/dL (ref 6.4–8.3)

## 2016-09-12 LAB — CBC WITH DIFFERENTIAL/PLATELET
BASO%: 1 % (ref 0.0–2.0)
Basophils Absolute: 0 10*3/uL (ref 0.0–0.1)
EOS ABS: 0.3 10*3/uL (ref 0.0–0.5)
EOS%: 7.3 % — AB (ref 0.0–7.0)
HEMATOCRIT: 33.2 % — AB (ref 38.4–49.9)
HEMOGLOBIN: 11 g/dL — AB (ref 13.0–17.1)
LYMPH#: 1.5 10*3/uL (ref 0.9–3.3)
LYMPH%: 33.3 % (ref 14.0–49.0)
MCH: 29.8 pg (ref 27.2–33.4)
MCHC: 33 g/dL (ref 32.0–36.0)
MCV: 90.2 fL (ref 79.3–98.0)
MONO#: 0.9 10*3/uL (ref 0.1–0.9)
MONO%: 19.7 % — ABNORMAL HIGH (ref 0.0–14.0)
NEUT#: 1.7 10*3/uL (ref 1.5–6.5)
NEUT%: 38.7 % — ABNORMAL LOW (ref 39.0–75.0)
PLATELETS: 139 10*3/uL — AB (ref 140–400)
RBC: 3.69 10*6/uL — ABNORMAL LOW (ref 4.20–5.82)
RDW: 17.2 % — AB (ref 11.0–14.6)
WBC: 4.4 10*3/uL (ref 4.0–10.3)

## 2016-09-12 MED ORDER — PROCHLORPERAZINE MALEATE 10 MG PO TABS
ORAL_TABLET | ORAL | Status: AC
  Filled 2016-09-12: qty 1

## 2016-09-12 MED ORDER — PROCHLORPERAZINE MALEATE 10 MG PO TABS
10.0000 mg | ORAL_TABLET | Freq: Once | ORAL | Status: AC
  Administered 2016-09-12: 10 mg via ORAL

## 2016-09-12 MED ORDER — BORTEZOMIB CHEMO SQ INJECTION 3.5 MG (2.5MG/ML)
1.3000 mg/m2 | Freq: Once | INTRAMUSCULAR | Status: AC
  Administered 2016-09-12: 2.5 mg via SUBCUTANEOUS
  Filled 2016-09-12: qty 2.5

## 2016-09-12 NOTE — Patient Instructions (Signed)
[facility] Discharge Instructions for Patients Receiving Chemotherapy  Today you received the following chemotherapy agents velcade   To help prevent nausea and vomiting after your treatment, we encourage you to take your nausea medication as directed  If you develop nausea and vomiting that is not controlled by your nausea medication, call the clinic.   BELOW ARE SYMPTOMS THAT SHOULD BE REPORTED IMMEDIATELY:  *FEVER GREATER THAN 100.5 F  *CHILLS WITH OR WITHOUT FEVER  NAUSEA AND VOMITING THAT IS NOT CONTROLLED WITH YOUR NAUSEA MEDICATION  *UNUSUAL SHORTNESS OF BREATH  *UNUSUAL BRUISING OR BLEEDING  TENDERNESS IN MOUTH AND THROAT WITH OR WITHOUT PRESENCE OF ULCERS  *URINARY PROBLEMS  *BOWEL PROBLEMS  UNUSUAL RASH Items with * indicate a potential emergency and should be followed up as soon as possible.  Feel free to call the clinic you have any questions or concerns. The clinic phone number is [phone].  

## 2016-09-13 ENCOUNTER — Other Ambulatory Visit: Payer: Self-pay | Admitting: *Deleted

## 2016-09-13 MED ORDER — POMALIDOMIDE 1 MG PO CAPS: 1.0000 mg | ORAL_CAPSULE | Freq: Every day | ORAL | 0 refills | Status: DC

## 2016-09-19 ENCOUNTER — Telehealth: Payer: Self-pay | Admitting: Oncology

## 2016-09-19 NOTE — Telephone Encounter (Signed)
Pt called to [facility] Jan 9 appts due to being out of town. Pt did not want to r/s at this time

## 2016-09-21 ENCOUNTER — Telehealth: Payer: Self-pay | Admitting: *Deleted

## 2016-09-21 NOTE — Telephone Encounter (Signed)
This RN obtained per fax from [employer] new PA form for pomalcyst.  Completed and faxed for continuation of known therapy.

## 2016-09-26 ENCOUNTER — Other Ambulatory Visit: Payer: Commercial Managed Care - PPO

## 2016-09-26 ENCOUNTER — Inpatient Hospital Stay: Payer: Commercial Managed Care - PPO

## 2016-09-26 ENCOUNTER — Encounter: Payer: Self-pay | Admitting: Oncology

## 2016-09-26 NOTE — Progress Notes (Signed)
Received approval from [employer] for [employer]. Approval is 09/21/16-09/21/17.

## 2016-09-28 ENCOUNTER — Telehealth: Payer: Self-pay | Admitting: *Deleted

## 2016-09-28 NOTE — Telephone Encounter (Signed)
Oral Chemotherapy Pharmacist Encounter  Noted approved prior authorization for Pomalyst from [employer] PA case #: [id number] Effective dates: 09/21/16-09/21/17  I called [employer] to alert them of PA approval. They will make a note on patient's account. They are not actively processing a Pomalyst prescription, but will reach out to the office with any issues.  Johny Drilling, PharmD, BCPS, BCOP 09/28/2016  9:21 AM Oral Oncology Clinic [phone]

## 2016-09-28 NOTE — Telephone Encounter (Signed)
This RN spoke with pt per his call stating ongoing and worsening " itching ".  Andre [name] states above started prior to leaving for [place] early January ( will be there 6 weeks ).  Andre [name] was seen by a local oncologist for continuation of velcade therapy while staying in [facility].  Per MD review of above pt will hold pomalyst at present- use benadryl if needed and requested Andre [name] to call this RN on Monday for update on rash for possible other interventions.  Andre [name] verbalized understanding - no other needs at this time.

## 2016-10-27 ENCOUNTER — Other Ambulatory Visit: Payer: Self-pay | Admitting: Adult Health

## 2016-10-31 ENCOUNTER — Ambulatory Visit (HOSPITAL_BASED_OUTPATIENT_CLINIC_OR_DEPARTMENT_OTHER): Payer: Commercial Managed Care - PPO

## 2016-10-31 ENCOUNTER — Ambulatory Visit (HOSPITAL_BASED_OUTPATIENT_CLINIC_OR_DEPARTMENT_OTHER): Payer: Commercial Managed Care - PPO | Admitting: Adult Health

## 2016-10-31 ENCOUNTER — Encounter: Payer: Self-pay | Admitting: Adult Health

## 2016-10-31 ENCOUNTER — Other Ambulatory Visit (HOSPITAL_BASED_OUTPATIENT_CLINIC_OR_DEPARTMENT_OTHER): Payer: Commercial Managed Care - PPO

## 2016-10-31 VITALS — BP 125/71 | HR 90 | Temp 97.9°F | Resp 18 | Ht 69.0 in | Wt 184.2 lb

## 2016-10-31 VITALS — BP 146/79 | HR 65 | Temp 98.6°F | Resp 20

## 2016-10-31 DIAGNOSIS — R21 Rash and other nonspecific skin eruption: Secondary | ICD-10-CM | POA: Diagnosis not present

## 2016-10-31 DIAGNOSIS — C9000 Multiple myeloma not having achieved remission: Secondary | ICD-10-CM

## 2016-10-31 DIAGNOSIS — C9002 Multiple myeloma in relapse: Secondary | ICD-10-CM

## 2016-10-31 DIAGNOSIS — D509 Iron deficiency anemia, unspecified: Secondary | ICD-10-CM

## 2016-10-31 LAB — COMPREHENSIVE METABOLIC PANEL
ALBUMIN: 3.8 g/dL (ref 3.5–5.0)
ALK PHOS: 94 U/L (ref 40–150)
ALT: 22 U/L (ref 0–55)
AST: 24 U/L (ref 5–34)
Anion Gap: 11 mEq/L (ref 3–11)
BILIRUBIN TOTAL: 0.25 mg/dL (ref 0.20–1.20)
BUN: 17.9 mg/dL (ref 7.0–26.0)
CALCIUM: 9.7 mg/dL (ref 8.4–10.4)
CO2: 23 mEq/L (ref 22–29)
CREATININE: 1.2 mg/dL (ref 0.7–1.3)
Chloride: 108 mEq/L (ref 98–109)
EGFR: 65 mL/min/{1.73_m2} — ABNORMAL LOW (ref >90)
GLUCOSE: 77 mg/dL (ref 70–140)
POTASSIUM: 3.9 meq/L (ref 3.5–5.1)
Sodium: 141 mEq/L (ref 136–145)
TOTAL PROTEIN: 7.3 g/dL (ref 6.4–8.3)

## 2016-10-31 LAB — CBC WITH DIFFERENTIAL/PLATELET
BASO%: 0.5 % (ref 0.0–2.0)
BASOS ABS: 0 10*3/uL (ref 0.0–0.1)
EOS ABS: 0.2 10*3/uL (ref 0.0–0.5)
EOS%: 4.5 % (ref 0.0–7.0)
HEMATOCRIT: 34.8 % — AB (ref 38.4–49.9)
HEMOGLOBIN: 11.3 g/dL — AB (ref 13.0–17.1)
LYMPH#: 1.6 10*3/uL (ref 0.9–3.3)
LYMPH%: 30.6 % (ref 14.0–49.0)
MCH: 29.5 pg (ref 27.2–33.4)
MCHC: 32.5 g/dL (ref 32.0–36.0)
MCV: 90.8 fL (ref 79.3–98.0)
MONO#: 0.7 10*3/uL (ref 0.1–0.9)
MONO%: 13 % (ref 0.0–14.0)
NEUT%: 51.4 % (ref 39.0–75.0)
NEUTROS ABS: 2.7 10*3/uL (ref 1.5–6.5)
Platelets: 167 10*3/uL (ref 140–400)
RBC: 3.83 10*6/uL — ABNORMAL LOW (ref 4.20–5.82)
RDW: 17.7 % — AB (ref 11.0–14.6)
WBC: 5.2 10*3/uL (ref 4.0–10.3)

## 2016-10-31 MED ORDER — SODIUM CHLORIDE 0.9 % IV SOLN
510.0000 mg | Freq: Once | INTRAVENOUS | Status: AC
  Administered 2016-10-31: 510 mg via INTRAVENOUS
  Filled 2016-10-31: qty 17

## 2016-10-31 MED ORDER — SODIUM CHLORIDE 0.9 % IV SOLN
Freq: Once | INTRAVENOUS | Status: AC
  Administered 2016-10-31: 15:00:00 via INTRAVENOUS

## 2016-10-31 NOTE — Patient Instructions (Signed)
Ferumoxytol injection What is this medicine? FERUMOXYTOL is an iron complex. Iron is used to make healthy red blood cells, which carry oxygen and nutrients throughout the body. This medicine is used to treat iron deficiency anemia in people with chronic kidney disease. COMMON BRAND NAME(S): Feraheme What should I tell my health care provider before I take this medicine? They need to know if you have any of these conditions: -anemia not caused by low iron levels -high levels of iron in the blood -magnetic resonance imaging (MRI) test scheduled -an unusual or allergic reaction to iron, other medicines, foods, dyes, or preservatives -pregnant or trying to get pregnant -breast-feeding How should I use this medicine? This medicine is for injection into a vein. It is given by a health care professional in a hospital or clinic setting. Talk to your pediatrician regarding the use of this medicine in children. Special care may be needed. What if I miss a dose? It is important not to miss your dose. Call your doctor or health care professional if you are unable to keep an appointment. What may interact with this medicine? This medicine may interact with the following medications: -other iron products What should I watch for while using this medicine? Visit your doctor or healthcare professional regularly. Tell your doctor or healthcare professional if your symptoms do not start to get better or if they get worse. You may need blood work done while you are taking this medicine. You may need to follow a special diet. Talk to your doctor. Foods that contain iron include: whole grains/cereals, dried fruits, beans, or peas, leafy green vegetables, and organ meats (liver, kidney). What side effects may I notice from receiving this medicine? Side effects that you should report to your doctor or health care professional as soon as possible: -allergic reactions like skin rash, itching or hives, swelling of the  face, lips, or tongue -breathing problems -changes in blood pressure -feeling faint or lightheaded, falls -fever or chills -flushing, sweating, or hot feelings -swelling of the ankles or feet Side effects that usually do not require medical attention (report to your doctor or health care professional if they continue or are bothersome): -diarrhea -headache -nausea, vomiting -stomach pain Where should I keep my medicine? This drug is given in a hospital or clinic and will not be stored at home.  2017 Elsevier/Gold Standard (2015-10-07 12:41:49)  

## 2016-10-31 NOTE — Progress Notes (Signed)
ID: Andre [name]   DOB: [birth year]  MR#: [id number]  VZD#:[id number]   PCP: [name] SU: OTHER MD: [name], [name], [address] [place] [phone]), Ardine Bjork  CHIEF COMPLAINT: Multiple myeloma  CURRENT TREATMENT: Maintenance therapy with bortezomib, zolendronate, and pomalidomide  INTERVAL HISTORY:  Patient is here today after his vacation to [facility] for 5-6 weeks.  He was taking pomalidomide while down there and had intense itching and a rash.  He called [place] and was instructed to stop the Pomalidomide and the rash and itching stopped.  He did receive Bortezomib on 09/26/16 and on 10/10/16 at the cancer center down in [place].  Dr. Baltazar Najjar directed his care while down there.  He did not receive Zometa while in [place] and says that he is getting that every month.    REVIEW OF SYSTEMS: He does have some knee pain in the left when climbing stairs.  He is taking a full dose Aspirin per day and denies any increase in lower extremity swelling or calf pain.  He was told that he was iron deficient and was recommended feraheme, however he declined until he came back here.  His ferritin level was 10 per the records I reviewed today.   A detailed ROS was conducted and is negative except what is noted above.   HISTORY OF PRESENT ILLNESS:  From the original intake nodes:  The patient was worked up for peptic ulcer disease in August of 2011, with significant bleeding and anemia.  The patient was Helicobacter pylori negative.  He received some epinephrine when he had his EGD and then started on Protonix.  [name] anemia slowly resolved so that by September, his hemoglobin was up to 10.9 and by earlier this month, his hemoglobin was up to 12.5.    As part of his general workup, he was found to have a slightly elevated globulin fraction.  In September, his total protein was 8.3 with an albumin of 3.8.  In January, the total protein was 8.4 and albumin  3.6.  With persistence of this slight abnormality, Dr. Olevia Perches obtained serum immunofixation and SPEP.  The SPTP showed an M-spike of 2.67 grams.  A total IgG was 4,190.  Total IgA low at 28.  Total IgM low at 28 also.  The immunofixation showed a monoclonal IgG lambda paraprotein.  There were also monoclonal free lambda light chains present. With this information, the patient was referred for further evaluation.   A diagnosis of myeloma was confirmed by bone marrow biopsy and subsequebnt treatment is as detailed below  PAST MEDICAL HISTORY: Past Medical History:  Diagnosis Date  . Allergy   . Anemia   . Arthritis   . Asthma    no treatment x 20 years  . Depression   . Double vision    occurs at times   . Duodenal ulcer   . GERD (gastroesophageal reflux disease)   . Hyperlipidemia   . Hypertension   . Hypothyroidism   . Multiple myeloma 07/04/2011  . Thyroid disease   Significant for peptic ulcer disease as noted above.  History of hyperlipidemia.  History of anxiety and depression.  History of GERD which is significantly improved with weight loss and history of reactive airway disease, possibly secondary to the GERD which also improved with weight loss.  There was a history of obesity, now much improved secondary to exercise and diet.   PAST SURGICAL HISTORY: Past Surgical History:  Procedure Laterality Date  .  BONE MARROW TRANSPLANT  2011   for MM  . CARDIOLITE STUDY  11/25/2003   NORMAL  . TONSILLECTOMY      FAMILY HISTORY Family History  Problem Relation Age of Onset  . Throat cancer Mother   . Hypertension Father   . Stroke Father   . Asthma Father   . Diabetes Father   . Pancreatic cancer Brother   [name] father died at the age of 35 following a stroke.  [name] mother died with cancer of the throat at the age of 76.   She had a history of depression and was a smoker.  Not clear how much alcohol she drank, according to the patient.  The patient had two  brothers.  One died from the age of 73 from a "kidney problem."  The second one died at the age of 53 from pancreatic cancer. (This may have been duodenal cancer. 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  His wife of 37+ years was [name], but they are now divorced. In 2016 he remarried, his current wife's name is [name].  The patient has two daughters:  [name], who works in [place], who is expecting her second child April 2016. .  They both live here in [place]. 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  The patient is very close to his daughters.  [place] first daughter, [name], born 03/06/2013, apparently has Weber/Osler/Rendu. The patient is not a church attender.  He does derive a great deal of support from friends at the "[employer]" which he attends very regularly, and also participates in Al-Anon even though actually there is no alcohol or drug problem in the family.  He participates in this group and he gets quite a bit of support from it.     ADVANCED DIRECTIVES: In place  HEALTH MAINTENANCE: (updated 07/16/2013) Social History  Substance Use Topics  . Smoking status: Former Smoker    Years: 3.00    Quit date: 03/27/1969  . Smokeless tobacco: Never Used  . Alcohol use No     Colonoscopy:  PSA:  Sept 2014, normal/Dr. [name]  Lipid panel: Sept 2014/Dr. [name]   Allergies  Allergen Reactions  . Atorvastatin Other (See Comments)  . Rosuvastatin     Other reaction(s): Liver Disorder  . Crestor [Rosuvastatin Calcium]     ADVERSE EFFECTS ON LIVER  . Lipitor [Atorvastatin Calcium]   . Septra [Sulfamethoxazole-Trimethoprim] Rash    Current Outpatient Prescriptions  Medication Sig Dispense Refill  . aspirin 325 MG tablet Take 325 mg by mouth daily.    .  cetirizine (ZYRTEC) 10 MG tablet Take 10 mg by mouth daily.    [name] docusate sodium (COLACE) 100 MG capsule Take 100 mg by mouth daily as needed for mild constipation or moderate constipation.    [name] escitalopram (LEXAPRO) 10 MG tablet take 1 tablet by mouth once daily 30 tablet 1  . feeding supplement, ENSURE ENLIVE, (ENSURE ENLIVE) LIQD Take 237 mLs by mouth 2 (two) times daily between meals. 237 mL 12  . levothyroxine (SYNTHROID, LEVOTHROID) 150 MCG tablet take 1 tablet by mouth every morning ON AN EMPTY STOMACH 90 tablet 1  . pantoprazole (PROTONIX) 40 MG tablet take 1 tablet by mouth twice a day before meals 60 tablet 3  .  pomalidomide (POMALYST) 1 MG capsule Take 1 capsule (1 mg total) by mouth daily. Take with water on days 1-21. Repeat every 28 days. 21 capsule 0  . pomalidomide (POMALYST) 1 MG capsule Take 1 capsule (1 mg total) by mouth daily. Take with water on days 1-21. Repeat every 28 days. 21 capsule 0  . ranitidine (ZANTAC) 150 MG tablet Take 150 mg by mouth 2 (two) times daily.    . sildenafil (VIAGRA) 25 MG tablet Take 1 tablet (25 mg total) by mouth daily as needed for erectile dysfunction. 10 tablet 0  . valACYclovir (VALTREX) 1000 MG tablet Take 1 tablet (1,000 mg total) by mouth daily. (Patient taking differently: Take 500 mg by mouth daily. ) 60 tablet 6  . zolendronic acid (ZOMETA) 4 MG/5ML injection Inject 4 mg into the vein every 3 (three) months.    . zolpidem (AMBIEN) 5 MG tablet Take 1-2 tablets (5-10 mg total) by mouth at bedtime as needed for sleep. 30 tablet 1   No current facility-administered medications for this visit.    Facility-Administered Medications Ordered in Other Visits  Medication Dose Route Frequency Provider Last Rate Last Dose  . 0.9 %  sodium chloride infusion   Intravenous Once Chauncey Cruel, MD        Objective: Middle-aged white man who appears stated age    BP: 136/66  Pulse: 62  Resp: 18  Temp: 97.6 F (36.4 C)   Body mass index is  27.69 kg/m.  Filed Weights   08/15/16 0954  Weight: 187 lb 8 oz (85 kg)    HEENT:  Sclerae anicteric.  PERRL.  Oropharynx clear and moist. No ulcerations or evidence of oropharyngeal candidiasis. Neck is supple.  NODES:  No cervical, supraclavicular, or axillary lymphadenopathy palpated.  LUNGS:  Clear to auscultation bilaterally.  No wheezes or rhonchi. HEART:  Regular rate and rhythm. No murmur appreciated. ABDOMEN:  Soft, nontender.  Positive, normoactive bowel sounds. No organomegaly palpated. MSK:  No focal spinal tenderness to palpation. Full range of motion bilaterally in the upper extremities. EXTREMITIES:  No peripheral edema.   SKIN:  Clear with no obvious rashes or skin changes. No nail dyscrasia. NEURO:  Nonfocal. Well oriented.  Appropriate affect.    LAB RESULTS:     Ref Range & Units 8d ago (08/07/16) 77moago (07/11/16) 1158mogo (07/11/16) 74m47moo (06/19/16)   Total Protein 6.0 - 8.5 g/dL 6.7  6.2      Albumin 2.9 - 4.4 g/dL 3.5  3.3  3.4R   3.4R     Alpha 1 0.0 - 0.4 g/dL 0.2  0.3      Alpha 2 0.4 - 1.0 g/dL 0.8  0.7      Beta 0.7 - 1.3 g/dL 1.2  1.1      Gamma Globulin 0.4 - 1.8 g/dL 0.9  0.8      M-Spike, % Not Observed g/dL Not Obs...  Not Obs...     Not Observed   GLOBULIN, TOTAL 2.2 - 3.9 g/dL 3.2  2.9      A/G Ratio 0.7 - 1.7 1.1  1.1      Please Note:  Comment  CommentCM          Ref Range & Units 8d ago 74mo574mo 58mo 19mo57mo a76mo  Ig Kappa Free Light Chain 3.3 - 19.4 mg/L 35.8   22.0   21.4   22.7     Ig Lambda Free Light Chain 5.7 -  26.3 mg/L 20.6  13.3  19.2  24.6    Kappa/Lambda FluidC Ratio 0.26 - 1.65 1.74   1.65  1.11  0.92      Lab Results  Component Value Date   WBC 4.0 08/15/2016   NEUTROABS 1.4 (L) 08/15/2016   HGB 11.2 (L) 08/15/2016   HCT 34.0 (L) 08/15/2016   MCV 87.9 08/15/2016   PLT 112 (L) 08/15/2016      Chemistry      Component Value Date/Time   NA 136 08/15/2016 0932   K 4.6 08/15/2016 0932   CL 105 02/28/2016  0318   CL 107 03/10/2013 1352   CO2 22 08/15/2016 0932   BUN 19.1 08/15/2016 0932   CREATININE 1.1 08/15/2016 0932      Component Value Date/Time   CALCIUM 9.5 08/15/2016 0932   ALKPHOS 96 08/15/2016 0932   AST 22 08/15/2016 0932   ALT 21 08/15/2016 0932   BILITOT 0.28 08/15/2016 0932     STUDIES: No results found.   ASSESSMENT: 66 y.o.  [place] man with a history of multiple myeloma diagnosed February of 2012, with an initial M-spike of 2.6 g/dL, IFE showing an IgG lambda paraprotein and free lambda light chains in the urine. Cytogenetics showed trisomy 71. Bone marrow biopsy showed a 22% plasmacytosis. Treated with   (1) bortezomib (subcutaneously), lenalidomide, and dexamethasone, with repeat bone marrow biopsy May of 2012 showing 10% plasmacytosis   (2) high-dose chemotherapy with BCNU and melphalan at [facility], followed by stem cell rescue July of 2012   (3) on zoledronic acid started December of 2012, initially monthly, currently given every 3 months, most recent dose  12/07/2015  (4) low-dose lenalidomide resumed April 2013, interrupted several times.  Resumed again on 02/19/2013, ata dose of 5 mg daily, 21 days on and 7 days off, later further reduced to 7 days on, 7 days off  (5) CNS symptoms and abnormal brain MRI extensively evaluated by neurology with no definitive diagnosis established  (6) rising M spike noted June 2014 but did not meet criteria for carfilzomib study  (7) on lenalidomide 25 mg daily, 14 days on, 7 days off, starting 04/18/2013, interrupted December 2014 because of rash;   (a) resumed January 2015 at 10 mg/ day at 21 days on/ 7 days off  (Zariah) starting 08/18/2014 decreased to 10 mg/ day 14 days on, 7 days off because of cytopenias  (c) as of February 2016 was on 5 mg daily 7 days on 7 days off  (d) lenalidomide discontinued December 2016 with evidence of disease progression  (8) transient global amnesia 05/29/2015, resolved without intervention  (9)  starting PVD 10/25/2015 w ASA 325 thromboprophylaxis, valacyclovir prophylaxis, last dose 12/17/2015  (a) pomalidomide 4 mg/d days 1-14  ([place]ux) bortezomib sQ days 2,5,9,12 of each 21 day cycle  (c) dexamethasome 20 mg two days a week  (10) metapneumovirus pneumonia April 2017  (a) completing course of steroids and week of bactrim mid April 2017  (11) status post second autologous transplant at [facility] 02/04/2016(preparatory regimen melphalan 200 mg/m)  (12) maintenance therapy started November 2017, consisting of  (a) bortezomib 1.3 mg/M2 QOW, first dose 07/27/2016  ([place]y) pomalidomide 1 mg days 1-21 Q28 days (increase to 2 mg as tolerated) started 07/19/2016  (c) zolendronate monthly started 07/27/2016 (previously Q 3 months)  PLAN:   [name] will receive feraheme today for a ferritin level of 10.  He also has feraheme scheduled for next week.  He does not want to  receive Bortezomib and Zometa today.  He tells me that it makes him feel hungover and therefore he wants to wait until Thursday since he has plans tonight.  I reviewed his itching and rash questionably from the pomalidomide with Dr. [name].  He was recommended to restart pomalidomide and if the rash recurs, we will have to explore other treatment options.  Andre [name] was in agreement with this.  He will return on Thursday for his bortezomib and zometa, and in one week for his second dose of feraheme.  He verbalized understanding of this plan.    A total of (30) minutes of face-to-face time was spent with this patient with greater than 50% of that time in counseling and care-coordination.   Charlestine Massed, NP 11/01/16

## 2016-11-01 ENCOUNTER — Other Ambulatory Visit: Payer: Self-pay | Admitting: Oncology

## 2016-11-02 LAB — IMMUNOFIXATION ELECTROPHORESIS
IGA/IMMUNOGLOBULIN A, SERUM: 165 mg/dL (ref 61–437)
IGG (IMMUNOGLOBIN G), SERUM: 940 mg/dL (ref 700–1600)
IgM, Qn, Serum: 38 mg/dL (ref 20–172)
Total Protein: 7.1 g/dL (ref 6.0–8.5)

## 2016-11-07 ENCOUNTER — Ambulatory Visit (HOSPITAL_BASED_OUTPATIENT_CLINIC_OR_DEPARTMENT_OTHER): Payer: Commercial Managed Care - PPO

## 2016-11-07 VITALS — BP 118/76 | HR 67 | Temp 98.0°F | Resp 18

## 2016-11-07 DIAGNOSIS — C9000 Multiple myeloma not having achieved remission: Secondary | ICD-10-CM

## 2016-11-07 DIAGNOSIS — D509 Iron deficiency anemia, unspecified: Secondary | ICD-10-CM

## 2016-11-07 DIAGNOSIS — C9002 Multiple myeloma in relapse: Secondary | ICD-10-CM

## 2016-11-07 MED ORDER — SODIUM CHLORIDE 0.9 % IV SOLN
510.0000 mg | Freq: Once | INTRAVENOUS | Status: AC
  Administered 2016-11-07: 510 mg via INTRAVENOUS
  Filled 2016-11-07: qty 17

## 2016-11-07 MED ORDER — SODIUM CHLORIDE 0.9 % IV SOLN
Freq: Once | INTRAVENOUS | Status: AC
  Administered 2016-11-07: 12:00:00 via INTRAVENOUS

## 2016-11-07 NOTE — Patient Instructions (Signed)
Ferumoxytol injection What is this medicine? FERUMOXYTOL is an iron complex. Iron is used to make healthy red blood cells, which carry oxygen and nutrients throughout the body. This medicine is used to treat iron deficiency anemia in people with chronic kidney disease. COMMON BRAND NAME(S): Feraheme What should I tell my health care provider before I take this medicine? They need to know if you have any of these conditions: -anemia not caused by low iron levels -high levels of iron in the blood -magnetic resonance imaging (MRI) test scheduled -an unusual or allergic reaction to iron, other medicines, foods, dyes, or preservatives -pregnant or trying to get pregnant -breast-feeding How should I use this medicine? This medicine is for injection into a vein. It is given by a health care professional in a hospital or clinic setting. Talk to your pediatrician regarding the use of this medicine in children. Special care may be needed. What if I miss a dose? It is important not to miss your dose. Call your doctor or health care professional if you are unable to keep an appointment. What may interact with this medicine? This medicine may interact with the following medications: -other iron products What should I watch for while using this medicine? Visit your doctor or healthcare professional regularly. Tell your doctor or healthcare professional if your symptoms do not start to get better or if they get worse. You may need blood work done while you are taking this medicine. You may need to follow a special diet. Talk to your doctor. Foods that contain iron include: whole grains/cereals, dried fruits, beans, or peas, leafy green vegetables, and organ meats (liver, kidney). What side effects may I notice from receiving this medicine? Side effects that you should report to your doctor or health care professional as soon as possible: -allergic reactions like skin rash, itching or hives, swelling of the  face, lips, or tongue -breathing problems -changes in blood pressure -feeling faint or lightheaded, falls -fever or chills -flushing, sweating, or hot feelings -swelling of the ankles or feet Side effects that usually do not require medical attention (report to your doctor or health care professional if they continue or are bothersome): -diarrhea -headache -nausea, vomiting -stomach pain Where should I keep my medicine? This drug is given in a hospital or clinic and will not be stored at home.  2017 Elsevier/Gold Standard (2015-10-07 12:41:49)  

## 2016-11-15 ENCOUNTER — Ambulatory Visit (HOSPITAL_BASED_OUTPATIENT_CLINIC_OR_DEPARTMENT_OTHER): Payer: Commercial Managed Care - PPO | Admitting: Adult Health

## 2016-11-15 ENCOUNTER — Encounter: Payer: Self-pay | Admitting: Adult Health

## 2016-11-15 ENCOUNTER — Other Ambulatory Visit: Payer: Self-pay | Admitting: Oncology

## 2016-11-15 ENCOUNTER — Ambulatory Visit (HOSPITAL_BASED_OUTPATIENT_CLINIC_OR_DEPARTMENT_OTHER): Payer: Commercial Managed Care - PPO

## 2016-11-15 ENCOUNTER — Other Ambulatory Visit (HOSPITAL_BASED_OUTPATIENT_CLINIC_OR_DEPARTMENT_OTHER): Payer: Commercial Managed Care - PPO

## 2016-11-15 VITALS — BP 115/56 | HR 64 | Temp 97.5°F | Resp 18 | Ht 69.0 in | Wt 186.8 lb

## 2016-11-15 DIAGNOSIS — D509 Iron deficiency anemia, unspecified: Secondary | ICD-10-CM

## 2016-11-15 DIAGNOSIS — C9000 Multiple myeloma not having achieved remission: Secondary | ICD-10-CM

## 2016-11-15 DIAGNOSIS — C9002 Multiple myeloma in relapse: Secondary | ICD-10-CM

## 2016-11-15 DIAGNOSIS — Z9481 Bone marrow transplant status: Secondary | ICD-10-CM

## 2016-11-15 DIAGNOSIS — Z5112 Encounter for antineoplastic immunotherapy: Secondary | ICD-10-CM

## 2016-11-15 LAB — COMPREHENSIVE METABOLIC PANEL
ALT: 31 U/L (ref 0–55)
ANION GAP: 9 meq/L (ref 3–11)
AST: 27 U/L (ref 5–34)
Albumin: 3.5 g/dL (ref 3.5–5.0)
Alkaline Phosphatase: 89 U/L (ref 40–150)
BILIRUBIN TOTAL: 0.39 mg/dL (ref 0.20–1.20)
BUN: 16.2 mg/dL (ref 7.0–26.0)
CALCIUM: 9.3 mg/dL (ref 8.4–10.4)
CO2: 20 meq/L — AB (ref 22–29)
CREATININE: 1.1 mg/dL (ref 0.7–1.3)
Chloride: 108 mEq/L (ref 98–109)
EGFR: 69 mL/min/{1.73_m2} — ABNORMAL LOW (ref >90)
Glucose: 92 mg/dl (ref 70–140)
Potassium: 4.3 mEq/L (ref 3.5–5.1)
Sodium: 136 mEq/L (ref 136–145)
TOTAL PROTEIN: 6.5 g/dL (ref 6.4–8.3)

## 2016-11-15 LAB — CBC WITH DIFFERENTIAL/PLATELET
BASO%: 0.2 % (ref 0.0–2.0)
Basophils Absolute: 0 10*3/uL (ref 0.0–0.1)
EOS%: 3.3 % (ref 0.0–7.0)
Eosinophils Absolute: 0.2 10*3/uL (ref 0.0–0.5)
HEMATOCRIT: 34.4 % — AB (ref 38.4–49.9)
HGB: 11.5 g/dL — ABNORMAL LOW (ref 13.0–17.1)
LYMPH#: 1.2 10*3/uL (ref 0.9–3.3)
LYMPH%: 25.3 % (ref 14.0–49.0)
MCH: 30.7 pg (ref 27.2–33.4)
MCHC: 33.4 g/dL (ref 32.0–36.0)
MCV: 91.7 fL (ref 79.3–98.0)
MONO#: 0.9 10*3/uL (ref 0.1–0.9)
MONO%: 19.1 % — ABNORMAL HIGH (ref 0.0–14.0)
NEUT%: 52.1 % (ref 39.0–75.0)
NEUTROS ABS: 2.4 10*3/uL (ref 1.5–6.5)
PLATELETS: 114 10*3/uL — AB (ref 140–400)
RBC: 3.75 10*6/uL — AB (ref 4.20–5.82)
RDW: 19 % — ABNORMAL HIGH (ref 11.0–14.6)
WBC: 4.6 10*3/uL (ref 4.0–10.3)

## 2016-11-15 MED ORDER — PROCHLORPERAZINE MALEATE 10 MG PO TABS
10.0000 mg | ORAL_TABLET | Freq: Once | ORAL | Status: AC
  Administered 2016-11-15: 10 mg via ORAL

## 2016-11-15 MED ORDER — ZOLEDRONIC ACID 4 MG/100ML IV SOLN
4.0000 mg | Freq: Once | INTRAVENOUS | Status: AC
  Administered 2016-11-15: 4 mg via INTRAVENOUS
  Filled 2016-11-15: qty 100

## 2016-11-15 MED ORDER — PROCHLORPERAZINE MALEATE 10 MG PO TABS
ORAL_TABLET | ORAL | Status: AC
  Filled 2016-11-15: qty 1

## 2016-11-15 MED ORDER — BORTEZOMIB CHEMO SQ INJECTION 3.5 MG (2.5MG/ML)
1.3000 mg/m2 | Freq: Once | INTRAMUSCULAR | Status: AC
  Administered 2016-11-15: 2.5 mg via SUBCUTANEOUS
  Filled 2016-11-15: qty 2.5

## 2016-11-15 NOTE — Patient Instructions (Signed)
[facility] Discharge Instructions for Patients Receiving Chemotherapy  Today you received the following chemotherapy agents: Velcade   To help prevent nausea and vomiting after your treatment, we encourage you to take your nausea medication as directed.    If you develop nausea and vomiting that is not controlled by your nausea medication, call the clinic.   BELOW ARE SYMPTOMS THAT SHOULD BE REPORTED IMMEDIATELY:  *FEVER GREATER THAN 100.5 F  *CHILLS WITH OR WITHOUT FEVER  NAUSEA AND VOMITING THAT IS NOT CONTROLLED WITH YOUR NAUSEA MEDICATION  *UNUSUAL SHORTNESS OF BREATH  *UNUSUAL BRUISING OR BLEEDING  TENDERNESS IN MOUTH AND THROAT WITH OR WITHOUT PRESENCE OF ULCERS  *URINARY PROBLEMS  *BOWEL PROBLEMS  UNUSUAL RASH Items with * indicate a potential emergency and should be followed up as soon as possible.  Feel free to call the clinic you have any questions or concerns. The clinic phone number is [phone].  Please show the CHEMO ALERT CARD at check-in to the Emergency Department and triage nurse.    Zoledronic Acid injection (Hypercalcemia, Oncology) What is this medicine? ZOLEDRONIC ACID (ZOE le dron ik AS id) lowers the amount of calcium loss from bone. It is used to treat too much calcium in your blood from cancer. It is also used to prevent complications of cancer that has spread to the bone. This medicine may be used for other purposes; ask your health care provider or pharmacist if you have questions. COMMON BRAND NAME(S): Zometa What should I tell my health care provider before I take this medicine? They need to know if you have any of these conditions: -aspirin-sensitive asthma -cancer, especially if you are receiving medicines used to treat cancer -dental disease or wear dentures -infection -kidney disease -receiving corticosteroids like dexamethasone or prednisone -an unusual or allergic reaction to zoledronic acid, other medicines,  foods, dyes, or preservatives -pregnant or trying to get pregnant -breast-feeding How should I use this medicine? This medicine is for infusion into a vein. It is given by a health care professional in a hospital or clinic setting. Talk to your pediatrician regarding the use of this medicine in children. Special care may be needed. Overdosage: If you think you have taken too much of this medicine contact a poison control center or emergency room at once. NOTE: This medicine is only for you. Do not share this medicine with others. What if I miss a dose? It is important not to miss your dose. Call your doctor or health care professional if you are unable to keep an appointment. What may interact with this medicine? -certain antibiotics given by injection -NSAIDs, medicines for pain and inflammation, like ibuprofen or naproxen -some diuretics like bumetanide, furosemide -teriparatide -thalidomide This list may not describe all possible interactions. Give your health care provider a list of all the medicines, herbs, non-prescription drugs, or dietary supplements you use. Also tell them if you smoke, drink alcohol, or use illegal drugs. Some items may interact with your medicine. What should I watch for while using this medicine? Visit your doctor or health care professional for regular checkups. It may be some time before you see the benefit from this medicine. Do not stop taking your medicine unless your doctor tells you to. Your doctor may order blood tests or other tests to see how you are doing. Women should inform their doctor if they wish to become pregnant or think they might be pregnant. There is a potential for serious side effects to an unborn   child. Talk to your health care professional or pharmacist for more information. You should make sure that you get enough calcium and vitamin D while you are taking this medicine. Discuss the foods you eat and the vitamins you take with your health  care professional. Some people who take this medicine have severe bone, joint, and/or muscle pain. This medicine may also increase your risk for jaw problems or a broken thigh bone. Tell your doctor right away if you have severe pain in your jaw, bones, joints, or muscles. Tell your doctor if you have any pain that does not go away or that gets worse. Tell your dentist and dental surgeon that you are taking this medicine. You should not have major dental surgery while on this medicine. See your dentist to have a dental exam and fix any dental problems before starting this medicine. Take good care of your teeth while on this medicine. Make sure you see your dentist for regular follow-up appointments. What side effects may I notice from receiving this medicine? Side effects that you should report to your doctor or health care professional as soon as possible: -allergic reactions like skin rash, itching or hives, swelling of the face, lips, or tongue -anxiety, confusion, or depression -breathing problems -changes in vision -eye pain -feeling faint or lightheaded, falls -jaw pain, especially after dental work -mouth sores -muscle cramps, stiffness, or weakness -redness, blistering, peeling or loosening of the skin, including inside the mouth -trouble passing urine or change in the amount of urine Side effects that usually do not require medical attention (report to your doctor or health care professional if they continue or are bothersome): -bone, joint, or muscle pain -constipation -diarrhea -fever -hair loss -irritation at site where injected -loss of appetite -nausea, vomiting -stomach upset -trouble sleeping -trouble swallowing -weak or tired This list may not describe all possible side effects. Call your doctor for medical advice about side effects. You may report side effects to FDA at 1-800-FDA-1088. Where should I keep my medicine? This drug is given in a hospital or clinic and  will not be stored at home. NOTE: This sheet is a summary. It may not cover all possible information. If you have questions about this medicine, talk to your doctor, pharmacist, or health care provider.  2018 Elsevier/Gold Standard (2014-01-31 14:19:39)  

## 2016-11-15 NOTE — Progress Notes (Signed)
ID: Andre [name]   DOB: 1950-10-09  MR#: [id number]  WEX#:[id number]   PCP: [name] SU: OTHER MD: [name], [name], [address] [place] [phone]), Ardine Bjork  CHIEF COMPLAINT: Multiple myeloma  CURRENT TREATMENT: Maintenance therapy with bortezomib, zolendronate, and pomalidomide  INTERVAL HISTORY:  Patient is here today for evaluation prior to receiving Velcade and Zometa.  He is doing well today.  He restarted Pomalidomide and is due to complete this cycle on March 5.  He will then take 1 week off.  He is taking 34m per day.  He did not develop a rash this time with taking it. And is tolerating it well.    Next appt with dentistry is on 12/05/2016 with Dr. [name]   REVIEW OF SYSTEMS: JJenny Reichmannis on a bowel regimen and has been having normal bowel movements.  He is sleeping well at night.  [facility].  He does have chronic knee pain with walking up stairs.  He has some swelling in his ankles bilaterally.  He does take an aspirin daily.  He denies fevers, chills, nausea, vomiting, constipation, diarrhea, chest pain, palpitations, shortness of breath, rash or any concerns.  A detailed ROS is non contributory.    HISTORY OF PRESENT ILLNESS:  From the original intake nodes:  The patient was worked up for peptic ulcer disease in August of 2011, with significant bleeding and anemia.  The patient was Helicobacter pylori negative.  He received some epinephrine when he had his EGD and then started on Protonix.  [name] anemia slowly resolved so that by September, his hemoglobin was up to 10.9 and by earlier this month, his hemoglobin was up to 12.5.    As part of his general workup, he was found to have a slightly elevated globulin fraction.  In September, his total protein was 8.3 with an albumin of 3.8.  In January, the total protein was 8.4 and albumin 3.6.  With persistence of  this slight abnormality, Dr. BOlevia Perchesobtained serum immunofixation and SPEP.  The SPTP showed an M-spike of 2.67 grams.  A total IgG was 4,190.  Total IgA low at 28.  Total IgM low at 28 also.  The immunofixation showed a monoclonal IgG lambda paraprotein.  There were also monoclonal free lambda light chains present. With this information, the patient was referred for further evaluation.   A diagnosis of myeloma was confirmed by bone marrow biopsy and subsequebnt treatment is as detailed below  PAST MEDICAL HISTORY: Past Medical History:  Diagnosis Date  . Allergy   . Anemia   . Arthritis   . Asthma    no treatment x 20 years  . Depression   . Double vision    occurs at times   . Duodenal ulcer   . GERD (gastroesophageal reflux disease)   . Hyperlipidemia   . Hypertension   . Hypothyroidism   . Multiple myeloma 07/04/2011  . Thyroid disease   Significant for peptic ulcer disease as noted above.  History of hyperlipidemia.  History of anxiety and depression.  History of GERD which is significantly improved with weight loss and history of reactive airway disease, possibly secondary to the GERD which also improved with weight loss.  There was a history of obesity, now much improved secondary to exercise and diet.   PAST SURGICAL HISTORY: Past Surgical History:  Procedure Laterality Date  . BONE MARROW TRANSPLANT  2011  for MM  . CARDIOLITE STUDY  11/25/2003   NORMAL  . TONSILLECTOMY      FAMILY HISTORY Family History  Problem Relation Age of Onset  . Throat cancer Mother   . Hypertension Father   . Stroke Father   . Asthma Father   . Diabetes Father   . Pancreatic cancer Brother   [name] father died at the age of 52 following a stroke.  [name] mother died with cancer of the throat at the age of 29.   She had a history of depression and was a smoker.  Not clear how much alcohol she drank, according to the patient.  The patient had two brothers.  One died from the  age of 63 from a "kidney problem."  The second one died at the age of 74 from pancreatic cancer. (This may have been duodenal cancer. 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  His wife of 37+ years was [name], but they are now divorced. In 2016 he remarried, his current wife's name is [name].  The patient has two daughters:  [name], who works in [place], who is expecting her second child April 2016. .  They both live here in [place]. 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  The patient is very close to his daughters.  Grivas first daughter, [name], born 03/06/2013, apparently has Weber/Osler/Rendu. The patient is not a church attender.  He does derive a great deal of support from friends at the [employer] which he attends very regularly, and also participates in Al-Anon even though actually there is no alcohol or drug problem in the family.  He participates in this group and he gets quite a bit of support from it.     ADVANCED DIRECTIVES: In place  HEALTH MAINTENANCE: (updated 07/16/2013) Social History  Substance Use Topics  . Smoking status: Former Smoker    Years: 3.00    Quit date: 03/27/1969  . Smokeless tobacco: Never Used  . Alcohol use No     Colonoscopy:  PSA:  Sept 2014, normal/Dr. [name]  Lipid panel: Sept 2014/Dr. [name]   Allergies  Allergen Reactions  . Atorvastatin Other (See Comments)  . Rosuvastatin     Other reaction(s): Liver Disorder  . Crestor [Rosuvastatin Calcium]     ADVERSE EFFECTS ON LIVER  . Lipitor [Atorvastatin Calcium]   . Septra [Sulfamethoxazole-Trimethoprim] Rash    Current Outpatient Prescriptions  Medication Sig Dispense Refill  . aspirin 325 MG tablet Take 325 mg by mouth daily.    . cetirizine (ZYRTEC) 10 MG  tablet Take 10 mg by mouth daily.    [name] docusate sodium (COLACE) 100 MG capsule Take 100 mg by mouth daily as needed for mild constipation or moderate constipation.    [name] escitalopram (LEXAPRO) 10 MG tablet take 1 tablet by mouth once daily 30 tablet 1  . feeding supplement, ENSURE ENLIVE, (ENSURE ENLIVE) LIQD Take 237 mLs by mouth 2 (two) times daily between meals. 237 mL 12  . levothyroxine (SYNTHROID, LEVOTHROID) 150 MCG tablet take 1 tablet by mouth every morning ON AN EMPTY STOMACH 90 tablet 1  . pantoprazole (PROTONIX) 40 MG tablet take 1 tablet by mouth twice a day before meals 60 tablet 3  . pomalidomide (POMALYST) 1 MG capsule  Take 1 capsule (1 mg total) by mouth daily. Take with water on days 1-21. Repeat every 28 days. 21 capsule 0  . pomalidomide (POMALYST) 1 MG capsule Take 1 capsule (1 mg total) by mouth daily. Take with water on days 1-21. Repeat every 28 days. 21 capsule 0  . ranitidine (ZANTAC) 150 MG tablet Take 150 mg by mouth 2 (two) times daily.    . sildenafil (VIAGRA) 25 MG tablet Take 1 tablet (25 mg total) by mouth daily as needed for erectile dysfunction. 10 tablet 0  . valACYclovir (VALTREX) 1000 MG tablet Take 1 tablet (1,000 mg total) by mouth daily. (Patient taking differently: Take 500 mg by mouth daily. ) 60 tablet 6  . zolendronic acid (ZOMETA) 4 MG/5ML injection Inject 4 mg into the vein every 3 (three) months.    . zolpidem (AMBIEN) 5 MG tablet Take 1-2 tablets (5-10 mg total) by mouth at bedtime as needed for sleep. 30 tablet 1   No current facility-administered medications for this visit.    Facility-Administered Medications Ordered in Other Visits  Medication Dose Route Frequency Provider Last Rate Last Dose  . 0.9 %  sodium chloride infusion   Intravenous Once Chauncey Cruel, MD        Objective: Middle-aged white man who appears stated age    BP: 136/66  Pulse: 62  Resp: 18  Temp: 97.6 F (36.4 C)   Body mass index is 27.69 kg/m.  Filed Weights    08/15/16 0954  Weight: 187 lb 8 oz (85 kg)  HEENT:  Sclerae anicteric.  Oropharynx clear and moist. No ulcerations or evidence of oropharyngeal candidiasis. Neck is supple. PERRL NODES:  No cervical, supraclavicular, or axillary lymphadenopathy palpated.  BREAST EXAM:  Deferred. LUNGS:  Clear to auscultation bilaterally.  No wheezes or rhonchi. HEART:  Regular rate and rhythm. No murmur appreciated. ABDOMEN:  Soft, nontender.  Positive, normoactive bowel sounds. No organomegaly palpated. MSK:  No focal spinal tenderness to palpation. Full range of motion bilaterally in the upper extremities. Crepitus noted to left knee, no swelling or tenderness to palpation EXTREMITIES:  Scant bilateral lower extremity edema   SKIN:  Clear with no obvious rashes or skin changes. No nail dyscrasia. NEURO:  Nonfocal. Well oriented.  Appropriate affect.      LAB RESULTS:     Ref Range & Units 8d ago (08/07/16) 30moago (07/11/16) 116mogo (07/11/16) 80m4683moo (06/19/16)   Total Protein 6.0 - 8.5 g/dL 6.7  6.2      Albumin 2.9 - 4.4 g/dL 3.5  3.3  3.4R   3.4R     Alpha 1 0.0 - 0.4 g/dL 0.2  0.3      Alpha 2 0.4 - 1.0 g/dL 0.8  0.7      Beta 0.7 - 1.3 g/dL 1.2  1.1      Gamma Globulin 0.4 - 1.8 g/dL 0.9  0.8      M-Spike, % Not Observed g/dL Not Obs...  Not Obs...     Not Observed   GLOBULIN, TOTAL 2.2 - 3.9 g/dL 3.2  2.9      A/G Ratio 0.7 - 1.7 1.1  1.1      Please Note:  Comment  CommentCM          Ref Range & Units 8d ago 80mo78mo 683mo 56mo83mo a70mo  Ig Kappa Free Light Chain 3.3 - 19.4 mg/L 35.8   22.0   21.4   22.7  Ig Lambda Free Light Chain 5.7 - 26.3 mg/L 20.6  13.3  19.2  24.6    Kappa/Lambda FluidC Ratio 0.26 - 1.65 1.74   1.65  1.11  0.92    Appointment on 11/15/2016  Component Date Value Ref Range Status  . WBC 11/15/2016 4.6  4.0 - 10.3 10e3/uL Final  . NEUT# 11/15/2016 2.4  1.5 - 6.5 10e3/uL Final  . HGB 11/15/2016 11.5* 13.0 - 17.1 g/dL Final  . HCT 11/15/2016 34.4* 38.4  - 49.9 % Final  . Platelets 11/15/2016 114* 140 - 400 10e3/uL Final  . MCV 11/15/2016 91.7  79.3 - 98.0 fL Final  . MCH 11/15/2016 30.7  27.2 - 33.4 pg Final  . MCHC 11/15/2016 33.4  32.0 - 36.0 g/dL Final  . RBC 11/15/2016 3.75* 4.20 - 5.82 10e6/uL Final  . RDW 11/15/2016 19.0* 11.0 - 14.6 % Final  . lymph# 11/15/2016 1.2  0.9 - 3.3 10e3/uL Final  . MONO# 11/15/2016 0.9  0.1 - 0.9 10e3/uL Final  . Eosinophils Absolute 11/15/2016 0.2  0.0 - 0.5 10e3/uL Final  . Basophils Absolute 11/15/2016 0.0  0.0 - 0.1 10e3/uL Final  . NEUT% 11/15/2016 52.1  39.0 - 75.0 % Final  . LYMPH% 11/15/2016 25.3  14.0 - 49.0 % Final  . MONO% 11/15/2016 19.1* 0.0 - 14.0 % Final  . EOS% 11/15/2016 3.3  0.0 - 7.0 % Final  . BASO% 11/15/2016 0.2  0.0 - 2.0 % Final  . Sodium 11/15/2016 136  136 - 145 mEq/L Final  . Potassium 11/15/2016 4.3  3.5 - 5.1 mEq/L Final  . Chloride 11/15/2016 108  98 - 109 mEq/L Final  . CO2 11/15/2016 20* 22 - 29 mEq/L Final  . Glucose 11/15/2016 92  70 - 140 mg/dl Final  . BUN 11/15/2016 16.2  7.0 - 26.0 mg/dL Final  . Creatinine 11/15/2016 1.1  0.7 - 1.3 mg/dL Final  . Total Bilirubin 11/15/2016 0.39  0.20 - 1.20 mg/dL Final  . Alkaline Phosphatase 11/15/2016 89  40 - 150 U/L Final  . AST 11/15/2016 27  5 - 34 U/L Final  . ALT 11/15/2016 31  0 - 55 U/L Final  . Total Protein 11/15/2016 6.5  6.4 - 8.3 g/dL Final  . Albumin 11/15/2016 3.5  3.5 - 5.0 g/dL Final  . Calcium 11/15/2016 9.3  8.4 - 10.4 mg/dL Final  . Anion Gap 11/15/2016 9  3 - 11 mEq/L Final  . EGFR 11/15/2016 69* >90 ml/min/1.73 m2 Final       STUDIES: No results found.   ASSESSMENT: 66 y.o.  [place] man with a history of multiple myeloma diagnosed February of 2012, with an initial M-spike of 2.6 g/dL, IFE showing an IgG lambda paraprotein and free lambda light chains in the urine. Cytogenetics showed trisomy 37. Bone marrow biopsy showed a 22% plasmacytosis. Treated with   (1) bortezomib (subcutaneously),  lenalidomide, and dexamethasone, with repeat bone marrow biopsy May of 2012 showing 10% plasmacytosis   (2) high-dose chemotherapy with BCNU and melphalan at [facility], followed by stem cell rescue July of 2012   (3) on zoledronic acid started December of 2012, initially monthly, currently given every 3 months, most recent dose  12/07/2015  (4) low-dose lenalidomide resumed April 2013, interrupted several times.  Resumed again on 02/19/2013, ata dose of 5 mg daily, 21 days on and 7 days off, later further reduced to 7 days on, 7 days off  (5) CNS symptoms and abnormal brain MRI extensively evaluated  by neurology with no definitive diagnosis established  (6) rising M spike noted June 2014 but did not meet criteria for carfilzomib study  (7) on lenalidomide 25 mg daily, 14 days on, 7 days off, starting 04/18/2013, interrupted December 2014 because of rash;   (a) resumed January 2015 at 10 mg/ day at 21 days on/ 7 days off  ([place]eno) starting 08/18/2014 decreased to 10 mg/ day 14 days on, 7 days off because of cytopenias  (c) as of February 2016 was on 5 mg daily 7 days on 7 days off  (d) lenalidomide discontinued December 2016 with evidence of disease progression  (8) transient global amnesia 05/29/2015, resolved without intervention  (9) starting PVD 10/25/2015 w ASA 325 thromboprophylaxis, valacyclovir prophylaxis, last dose 12/17/2015  (a) pomalidomide 4 mg/d days 1-14  (Javonn) bortezomib sQ days 2,5,9,12 of each 21 day cycle  (c) dexamethasome 20 mg two days a week  (10) metapneumovirus pneumonia April 2017  (a) completing course of steroids and week of bactrim mid April 2017  (11) status post second autologous transplant at [facility] 02/04/2016(preparatory regimen melphalan 200 mg/m)  (12) maintenance therapy started November 2017, consisting of  (a) bortezomib 1.3 mg/M2 QOW, first dose 07/27/2016  ([place]ain) pomalidomide 1 mg days 1-21 Q28 days (increase to 2 mg as tolerated) started 07/19/2016  (c)  zolendronate monthly started 07/27/2016 (previously Q 3 months)  PLAN:  [name] is doing well today.  His CBC is stable and I reviewed this with him today.  CMP is pending.  He will proceed with treatment today.  He will return in 2 weeks for lab/eval and Velcade.    He is due for his next cycle of pomalidomide on 11/27/16.  He will continue with Pomalidomide 74m daily until around 11/20/16.     A total of (30) minutes of face-to-face time was spent with this patient with greater than 50% of that time in counseling and care-coordination.   LCharlestine Massed NP 11/15/16

## 2016-11-16 ENCOUNTER — Ambulatory Visit
Admission: RE | Admit: 2016-11-16 | Payer: Commercial Managed Care - PPO | Source: Ambulatory Visit | Admitting: Radiation Oncology

## 2016-11-17 ENCOUNTER — Telehealth: Payer: Self-pay | Admitting: Adult Health

## 2016-11-17 MED ORDER — ONDANSETRON HCL 8 MG PO TABS: 8.0000 mg | ORAL_TABLET | Freq: Three times a day (TID) | ORAL | 0 refills | Status: DC | PRN

## 2016-11-17 NOTE — Telephone Encounter (Signed)
Patient c/o nausea, and fatigue since Bortezomib and Zometa 2 weeks ago.  He has taken compazine but it is making him tired.  I sent in Ondansetron PRN for him to take if needed.  To call if not improving or worsening at all.  Charlestine Massed, NP

## 2016-11-20 ENCOUNTER — Other Ambulatory Visit (HOSPITAL_COMMUNITY)
Admission: AD | Admit: 2016-11-20 | Discharge: 2016-11-20 | Disposition: A | Discharge status: Home / Self Care | Payer: Commercial Managed Care - PPO | Source: Ambulatory Visit | Attending: Adult Health | Admitting: Adult Health

## 2016-11-20 ENCOUNTER — Ambulatory Visit (HOSPITAL_COMMUNITY)
Admission: RE | Admit: 2016-11-20 | Discharge: 2016-11-20 | Disposition: A | Discharge status: Home / Self Care | Payer: Commercial Managed Care - PPO | Source: Ambulatory Visit | Attending: Adult Health | Admitting: Adult Health

## 2016-11-20 ENCOUNTER — Encounter: Payer: Self-pay | Admitting: Adult Health

## 2016-11-20 ENCOUNTER — Ambulatory Visit (HOSPITAL_BASED_OUTPATIENT_CLINIC_OR_DEPARTMENT_OTHER): Payer: Commercial Managed Care - PPO

## 2016-11-20 ENCOUNTER — Ambulatory Visit (HOSPITAL_BASED_OUTPATIENT_CLINIC_OR_DEPARTMENT_OTHER): Payer: Commercial Managed Care - PPO | Admitting: Adult Health

## 2016-11-20 VITALS — BP 122/72 | HR 103 | Temp 99.9°F | Resp 18 | Ht 69.0 in | Wt 185.4 lb

## 2016-11-20 DIAGNOSIS — J101 Influenza due to other identified influenza virus with other respiratory manifestations: Secondary | ICD-10-CM

## 2016-11-20 DIAGNOSIS — R05 Cough: Secondary | ICD-10-CM | POA: Insufficient documentation

## 2016-11-20 DIAGNOSIS — R5383 Other fatigue: Secondary | ICD-10-CM

## 2016-11-20 DIAGNOSIS — C9000 Multiple myeloma not having achieved remission: Secondary | ICD-10-CM

## 2016-11-20 DIAGNOSIS — R509 Fever, unspecified: Secondary | ICD-10-CM | POA: Diagnosis present

## 2016-11-20 LAB — COMPREHENSIVE METABOLIC PANEL
ALBUMIN: 3.6 g/dL (ref 3.5–5.0)
ALK PHOS: 108 U/L (ref 40–150)
ALT: 45 U/L (ref 0–55)
AST: 33 U/L (ref 5–34)
Anion Gap: 8 mEq/L (ref 3–11)
BILIRUBIN TOTAL: 0.31 mg/dL (ref 0.20–1.20)
BUN: 17.2 mg/dL (ref 7.0–26.0)
CALCIUM: 9.2 mg/dL (ref 8.4–10.4)
CO2: 25 mEq/L (ref 22–29)
Chloride: 100 mEq/L (ref 98–109)
Creatinine: 1.3 mg/dL (ref 0.7–1.3)
EGFR: 55 mL/min/{1.73_m2} — AB (ref >90)
GLUCOSE: 88 mg/dL (ref 70–140)
POTASSIUM: 4.6 meq/L (ref 3.5–5.1)
Sodium: 133 mEq/L — ABNORMAL LOW (ref 136–145)
TOTAL PROTEIN: 6.8 g/dL (ref 6.4–8.3)

## 2016-11-20 LAB — CBC WITH DIFFERENTIAL/PLATELET
BASO%: 0.2 % (ref 0.0–2.0)
BASOS ABS: 0 10*3/uL (ref 0.0–0.1)
EOS ABS: 0 10*3/uL (ref 0.0–0.5)
EOS%: 0.8 % (ref 0.0–7.0)
HEMATOCRIT: 37.4 % — AB (ref 38.4–49.9)
HEMOGLOBIN: 12.2 g/dL — AB (ref 13.0–17.1)
LYMPH#: 0.6 10*3/uL — AB (ref 0.9–3.3)
LYMPH%: 15.4 % (ref 14.0–49.0)
MCH: 31.2 pg (ref 27.2–33.4)
MCHC: 32.7 g/dL (ref 32.0–36.0)
MCV: 95.4 fL (ref 79.3–98.0)
MONO#: 1.4 10*3/uL — AB (ref 0.1–0.9)
MONO%: 36 % — ABNORMAL HIGH (ref 0.0–14.0)
NEUT%: 47.6 % (ref 39.0–75.0)
NEUTROS ABS: 1.9 10*3/uL (ref 1.5–6.5)
PLATELETS: 84 10*3/uL — AB (ref 140–400)
RBC: 3.92 10*6/uL — ABNORMAL LOW (ref 4.20–5.82)
RDW: 22.3 % — AB (ref 11.0–14.6)
WBC: 3.9 10*3/uL — ABNORMAL LOW (ref 4.0–10.3)

## 2016-11-20 LAB — INFLUENZA PANEL BY PCR (TYPE A & B)
Influenza A By PCR: NEGATIVE
Influenza B By PCR: POSITIVE — AB

## 2016-11-20 MED ORDER — OSELTAMIVIR PHOSPHATE 75 MG PO CAPS: 75.0000 mg | ORAL_CAPSULE | Freq: Two times a day (BID) | ORAL | 0 refills | Status: DC

## 2016-11-20 NOTE — Progress Notes (Signed)
ID: [name]   DOB: 1951/01/10  MR#: [id number]  MOQ#:[id number]   PCP: Andre [name] SU: OTHER MD: Andre [name], Andre [name], [address] [place] [phone]), Andre [name]  CHIEF COMPLAINT: Multiple myeloma  CURRENT TREATMENT: Maintenance therapy with bortezomib, zolendronate, and pomalidomide  INTERVAL HISTORY:  Patient here for urgent appointment due to low grade fevers, muscle aches and fatigue.  These all started the day after receiving bortezomib last Thursday.  He is coughing, has nasal drainage.  No sinus tenderness or ear pain.  He has no known sick contacts but has been out in public several times recently.     REVIEW OF SYSTEMS: Other than what is noted above a detailed ROS is non contributory.  HISTORY OF PRESENT ILLNESS:  From the original intake nodes:  The patient was worked up for peptic ulcer disease in August of 2011, with significant bleeding and anemia.  The patient was Helicobacter pylori negative.  He received some epinephrine when he had his EGD and then started on Protonix.  [name] anemia slowly resolved so that by September, his hemoglobin was up to 10.9 and by earlier this month, his hemoglobin was up to 12.5.    As part of his general workup, he was found to have a slightly elevated globulin fraction.  In September, his total protein was 8.3 with an albumin of 3.8.  In January, the total protein was 8.4 and albumin 3.6.  With persistence of this slight abnormality, Dr. Olevia [name] obtained serum immunofixation and SPEP.  The SPTP showed an M-spike of 2.67 grams.  A total IgG was 4,190.  Total IgA low at 28.  Total IgM low at 28 also.  The immunofixation showed a monoclonal IgG lambda paraprotein.  There were also monoclonal free lambda light chains present. With this information, the patient was referred for further evaluation.   A diagnosis of myeloma was confirmed by bone marrow biopsy and subsequebnt treatment is as  detailed below  PAST MEDICAL HISTORY: Past Medical History:  Diagnosis Date  . Allergy   . Anemia   . Arthritis   . Asthma    no treatment x 20 years  . Depression   . Double vision    occurs at times   . Duodenal ulcer   . GERD (gastroesophageal reflux disease)   . Hyperlipidemia   . Hypertension   . Hypothyroidism   . Multiple myeloma 07/04/2011  . Thyroid disease   Significant for peptic ulcer disease as noted above.  History of hyperlipidemia.  History of anxiety and depression.  History of GERD which is significantly improved with weight loss and history of reactive airway disease, possibly secondary to the GERD which also improved with weight loss.  There was a history of obesity, now much improved secondary to exercise and diet.   PAST SURGICAL HISTORY: Past Surgical History:  Procedure Laterality Date  . BONE MARROW TRANSPLANT  2011   for MM  . CARDIOLITE STUDY  11/25/2003   NORMAL  . TONSILLECTOMY      FAMILY HISTORY Family History  Problem Relation Age of Onset  . Throat cancer Mother   . Hypertension Father   . Stroke Father   . Asthma Father   . Diabetes Father   . Pancreatic cancer Brother   [name] father died at the age of 8 following a stroke.  [name] mother died with cancer of the throat at the age of 79.   She had a history  of depression and was a smoker.  Not clear how much alcohol she drank, according to the patient.  The patient had two brothers.  One died from the age of 99 from a "kidney problem."  The second one died at the age of 39 from pancreatic cancer. (This may have been duodenal cancer. 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  His wife of 37+ years was Andre [name], but they are now divorced. In 2016 he remarried, his current wife's name is Andre [name].  The patient has two  daughters:  Andre [name], who works in [place], who is expecting her second child April 2016. .  They both live here in [place]. 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  The patient is very close to his daughters.  Thwaites first daughter, Andre [name], born 03/06/2013, apparently has Weber/Osler/Rendu. The patient is not a church attender.  He does derive a great deal of support from friends at the "[employer]" which he attends very regularly, and also participates in Al-Anon even though actually there is no alcohol or drug problem in the family.  He participates in this group and he gets quite a bit of support from it.     ADVANCED DIRECTIVES: In place  HEALTH MAINTENANCE: (updated 07/16/2013) Social History  Substance Use Topics  . Smoking status: Former Smoker    Years: 3.00    Quit date: 03/27/1969  . Smokeless tobacco: Never Used  . Alcohol use No     Colonoscopy:  PSA:  Sept 2014, normal/Dr. [name]  Lipid panel: Sept 2014/Dr. [name]   Allergies  Allergen Reactions  . Atorvastatin Other (See Comments)  . Rosuvastatin     Other reaction(s): Liver Disorder  . Crestor [Rosuvastatin Calcium]     ADVERSE EFFECTS ON LIVER  . Lipitor [Atorvastatin Calcium]   . Septra [Sulfamethoxazole-Trimethoprim] Rash    Current Outpatient Prescriptions  Medication Sig Dispense Refill  . aspirin 325 MG tablet Take 325 mg by mouth daily.    . cetirizine (ZYRTEC) 10 MG tablet Take 10 mg by mouth daily.    [name] docusate sodium (COLACE) 100 MG capsule Take 100 mg by mouth daily as needed for mild constipation or moderate constipation.    [name] escitalopram (LEXAPRO) 10 MG tablet take 1 tablet by mouth once daily 30 tablet 1  . feeding supplement, ENSURE ENLIVE, (ENSURE ENLIVE) LIQD Take 237 mLs by mouth 2 (two) times daily between meals. 237 mL 12  . levothyroxine (SYNTHROID, LEVOTHROID) 150 MCG tablet take 1 tablet by mouth every morning ON AN EMPTY  STOMACH 90 tablet 1  . pantoprazole (PROTONIX) 40 MG tablet take 1 tablet by mouth twice a day before meals 60 tablet 3  . pomalidomide (POMALYST) 1 MG capsule Take 1 capsule (1 mg total) by mouth daily. Take with water on days 1-21. Repeat every 28 days. 21 capsule 0  . pomalidomide (POMALYST) 1 MG capsule Take 1 capsule (1 mg total) by mouth daily. Take with water on days 1-21. Repeat every 28 days. 21 capsule 0  . ranitidine (ZANTAC) 150 MG tablet Take 150 mg by mouth 2 (two) times daily.    . sildenafil (VIAGRA) 25 MG tablet Take 1 tablet (25 mg total) by mouth daily as needed for erectile dysfunction. 10 tablet 0  .  valACYclovir (VALTREX) 1000 MG tablet Take 1 tablet (1,000 mg total) by mouth daily. (Patient taking differently: Take 500 mg by mouth daily. ) 60 tablet 6  . zolendronic acid (ZOMETA) 4 MG/5ML injection Inject 4 mg into the vein every 3 (three) months.    . zolpidem (AMBIEN) 5 MG tablet Take 1-2 tablets (5-10 mg total) by mouth at bedtime as needed for sleep. 30 tablet 1   No current facility-administered medications for this visit.    Facility-Administered Medications Ordered in Other Visits  Medication Dose Route Frequency Provider Last Rate Last Dose  . 0.9 %  sodium chloride infusion   Intravenous Once Chauncey Cruel, MD        Objective: Middle-aged white man who appears stated age, appears tired    BP: 136/66  Pulse: 62  Resp: 18  Temp: 97.6 F (36.4 C)   Body mass index is 27.69 kg/m.  Filed Weights   08/15/16 0954  Weight: 187 lb 8 oz (85 kg)  HEENT:  Sclerae anicteric.  Oropharynx clear and moist. No ulcerations or evidence of oropharyngeal candidiasis. Neck is supple. PERRL NODES:  No cervical, supraclavicular, or axillary lymphadenopathy palpated.  LUNGS:  Clear to auscultation bilaterally.  No wheezes or rhonchi. HEART:  Regular rate and rhythm. No murmur appreciated. ABDOMEN:  Soft, nontender.  Positive, normoactive bowel sounds. No organomegaly  palpated. MSK:  No focal spinal tenderness to palpation. Full range of motion bilaterally in the upper extremities. EXTREMITIES:  Scant bilateral lower extremity edema   SKIN:  Clear with no obvious rashes or skin changes. NEURO:  Nonfocal. Well oriented.  Appropriate affect.      LAB RESULTS:     Ref Range & Units 8d ago (08/07/16) 7105moago (07/11/16) 174mogo (07/11/16) 31m74moo (06/19/16)   Total Protein 6.0 - 8.5 g/dL 6.7  6.2      Albumin 2.9 - 4.4 g/dL 3.5  3.3  3.4R   3.4R     Alpha 1 0.0 - 0.4 g/dL 0.2  0.3      Alpha 2 0.4 - 1.0 g/dL 0.8  0.7      Beta 0.7 - 1.3 g/dL 1.2  1.1      Gamma Globulin 0.4 - 1.8 g/dL 0.9  0.8      M-Spike, % Not Observed g/dL Not Obs...  Not Obs...     Not Observed   GLOBULIN, TOTAL 2.2 - 3.9 g/dL 3.2  2.9      A/G Ratio 0.7 - 1.7 1.1  1.1      Please Note:  Comment  CommentCM          Ref Range & Units 8d ago [id number] 105mo 362mo62mo a34mo  Ig Kappa Free Light Chain 3.3 - 19.4 mg/L 35.8   22.0   21.4   22.7     Ig Lambda Free Light Chain 5.7 - 26.3 mg/L 20.6  13.3  19.2  24.6    Kappa/Lambda FluidC Ratio 0.26 - 1.65 1.74   1.65  1.11  0.92    Appointment on 11/20/2016  Component Date Value Ref Range Status  . WBC 11/20/2016 3.9* 4.0 - 10.3 10e3/uL Final  . NEUT# 11/20/2016 1.9  1.5 - 6.5 10e3/uL Final  . HGB 11/20/2016 12.2* 13.0 - 17.1 g/dL Final  . HCT 11/20/2016 37.4* 38.4 - 49.9 % Final  . Platelets 11/20/2016 84* 140 - 400 10e3/uL Final  . MCV 11/20/2016 95.4  79.3 - 98.0 fL  Final  . MCH 11/20/2016 31.2  27.2 - 33.4 pg Final  . MCHC 11/20/2016 32.7  32.0 - 36.0 g/dL Final  . RBC 11/20/2016 3.92* 4.20 - 5.82 10e6/uL Final  . RDW 11/20/2016 22.3* 11.0 - 14.6 % Final  . lymph# 11/20/2016 0.6* 0.9 - 3.3 10e3/uL Final  . MONO# 11/20/2016 1.4* 0.1 - 0.9 10e3/uL Final  . Eosinophils Absolute 11/20/2016 0.0  0.0 - 0.5 10e3/uL Final  . Basophils Absolute 11/20/2016 0.0  0.0 - 0.1 10e3/uL Final  . NEUT% 11/20/2016 47.6  39.0 - 75.0 % Final    . LYMPH% 11/20/2016 15.4  14.0 - 49.0 % Final  . MONO% 11/20/2016 36.0* 0.0 - 14.0 % Final  . EOS% 11/20/2016 0.8  0.0 - 7.0 % Final  . BASO% 11/20/2016 0.2  0.0 - 2.0 % Final  . Sodium 11/20/2016 133* 136 - 145 mEq/L Final  . Potassium 11/20/2016 4.6  3.5 - 5.1 mEq/L Final  . Chloride 11/20/2016 100  98 - 109 mEq/L Final  . CO2 11/20/2016 25  22 - 29 mEq/L Final  . Glucose 11/20/2016 88  70 - 140 mg/dl Final  . BUN 11/20/2016 17.2  7.0 - 26.0 mg/dL Final  . Creatinine 11/20/2016 1.3  0.7 - 1.3 mg/dL Final  . Total Bilirubin 11/20/2016 0.31  0.20 - 1.20 mg/dL Final  . Alkaline Phosphatase 11/20/2016 108  40 - 150 U/L Final  . AST 11/20/2016 33  5 - 34 U/L Final  . ALT 11/20/2016 45  0 - 55 U/L Final  . Total Protein 11/20/2016 6.8  6.4 - 8.3 g/dL Final  . Albumin 11/20/2016 3.6  3.5 - 5.0 g/dL Final  . Calcium 11/20/2016 9.2  8.4 - 10.4 mg/dL Final  . Anion Gap 11/20/2016 8  3 - 11 mEq/L Final  . EGFR 11/20/2016 55* >90 ml/min/1.73 m2 Final       STUDIES: No results found.   ASSESSMENT: 66 y.o.  [place] man with a history of multiple myeloma diagnosed February of 2012, with an initial M-spike of 2.6 g/dL, IFE showing an IgG lambda paraprotein and free lambda light chains in the urine. Cytogenetics showed trisomy 44. Bone marrow biopsy showed a 22% plasmacytosis. Treated with   (1) bortezomib (subcutaneously), lenalidomide, and dexamethasone, with repeat bone marrow biopsy May of 2012 showing 10% plasmacytosis   (2) high-dose chemotherapy with BCNU and melphalan at [facility], followed by stem cell rescue July of 2012   (3) on zoledronic acid started December of 2012, initially monthly, currently given every 3 months, most recent dose  12/07/2015  (4) low-dose lenalidomide resumed April 2013, interrupted several times.  Resumed again on 02/19/2013, ata dose of 5 mg daily, 21 days on and 7 days off, later further reduced to 7 days on, 7 days off  (5) CNS symptoms and abnormal brain  MRI extensively evaluated by neurology with no definitive diagnosis established  (6) rising M spike noted June 2014 but did not meet criteria for carfilzomib study  (7) on lenalidomide 25 mg daily, 14 days on, 7 days off, starting 04/18/2013, interrupted December 2014 because of rash;   (a) resumed January 2015 at 10 mg/ day at 21 days on/ 7 days off  ([place]) starting 08/18/2014 decreased to 10 mg/ day 14 days on, 7 days off because of cytopenias  (c) as of February 2016 was on 5 mg daily 7 days on 7 days off  (d) lenalidomide discontinued December 2016 with evidence of disease progression  (8)  transient global amnesia 05/29/2015, resolved without intervention  (9) starting PVD 10/25/2015 w ASA 325 thromboprophylaxis, valacyclovir prophylaxis, last dose 12/17/2015  (a) pomalidomide 4 mg/d days 1-14  ([place]) bortezomib sQ days 2,5,9,12 of each 21 day cycle  (c) dexamethasome 20 mg two days a week  (10) metapneumovirus pneumonia April 2017  (a) completing course of steroids and week of bactrim mid April 2017  (11) status post second autologous [facility] at [facility] 02/04/2016(preparatory regimen melphalan 200 mg/m)  (12) maintenance therapy started November 2017, consisting of  (a) bortezomib 1.3 mg/M2 QOW, first dose 07/27/2016  ([place]) pomalidomide 1 mg days 1-21 Q28 days (increase to 2 mg as tolerated) started 07/19/2016  (c) zolendronate monthly started 07/27/2016 (previously Q 3 months)  PLAN:  Andre [name]'s labs look pretty good.  WBCs are normal,   He underwent flu swab, and chest xray today.  Chest xray was normal, he was influenza Dawsyn positive.  Tamiflu sent into his pharmacy.     A total of (20) minutes of face-to-face time was spent with this patient with greater than 50% of that time in counseling and care-coordination.   Charlestine Massed, NP 11/20/16

## 2016-11-22 ENCOUNTER — Other Ambulatory Visit: Payer: Self-pay | Admitting: *Deleted

## 2016-11-22 MED ORDER — POMALIDOMIDE 1 MG PO CAPS: 1.0000 mg | ORAL_CAPSULE | Freq: Every day | ORAL | 0 refills | Status: DC

## 2016-11-28 ENCOUNTER — Telehealth: Payer: Self-pay | Admitting: *Deleted

## 2016-11-28 ENCOUNTER — Other Ambulatory Visit: Payer: Self-pay | Admitting: *Deleted

## 2016-11-28 MED ORDER — POMALIDOMIDE 1 MG PO CAPS: 1.0000 mg | ORAL_CAPSULE | Freq: Every day | ORAL | 0 refills | Status: DC

## 2016-11-28 NOTE — Telephone Encounter (Signed)
"  I'm supposed to see Dr. [name] tomorrow and receive Velcade.  I've had the flu two weeks and still do.I'm thinking I shouldn't come in. Please call me.  [phone]."      Called collaborative nurse.  Reports has already spoken with this patient.

## 2016-11-29 ENCOUNTER — Encounter: Payer: Self-pay | Admitting: Oncology

## 2016-11-29 ENCOUNTER — Other Ambulatory Visit: Payer: Commercial Managed Care - PPO

## 2016-11-29 ENCOUNTER — Ambulatory Visit: Payer: Commercial Managed Care - PPO | Admitting: Oncology

## 2016-11-29 ENCOUNTER — Ambulatory Visit: Payer: Commercial Managed Care - PPO

## 2016-12-04 ENCOUNTER — Telehealth: Payer: Self-pay | Admitting: Emergency Medicine

## 2016-12-04 NOTE — Telephone Encounter (Signed)
Patient called stating he is feeling better and is inquiring on when he can restart his treatment.  Per patient he has not restarted the Pomalidomide and states he has 2 tablets left.  Patient also states that next week would be best for coming in for an appointment.   Advised patient that we will look over everything and follow up with him this week with additional appointments and plan. Patient verbalized understanding.

## 2016-12-05 NOTE — Telephone Encounter (Signed)
Andre [name],  Can you set patient up for lab and appt and velcade for next week on Wednesday?  Val, can you give me an update on pomalidomide?     Thanks!  [name]

## 2016-12-08 ENCOUNTER — Telehealth: Payer: Self-pay | Admitting: Oncology

## 2016-12-08 NOTE — Telephone Encounter (Signed)
sw pt to confirm 3/28 appt at 11 am per LOS

## 2016-12-11 ENCOUNTER — Other Ambulatory Visit: Payer: Self-pay | Admitting: Oncology

## 2016-12-13 ENCOUNTER — Other Ambulatory Visit: Payer: Self-pay | Admitting: Oncology

## 2016-12-13 ENCOUNTER — Encounter: Payer: Self-pay | Admitting: Adult Health

## 2016-12-13 ENCOUNTER — Ambulatory Visit (HOSPITAL_BASED_OUTPATIENT_CLINIC_OR_DEPARTMENT_OTHER): Payer: Commercial Managed Care - PPO

## 2016-12-13 ENCOUNTER — Ambulatory Visit: Payer: Commercial Managed Care - PPO

## 2016-12-13 ENCOUNTER — Other Ambulatory Visit (HOSPITAL_BASED_OUTPATIENT_CLINIC_OR_DEPARTMENT_OTHER): Payer: Commercial Managed Care - PPO

## 2016-12-13 ENCOUNTER — Ambulatory Visit (HOSPITAL_BASED_OUTPATIENT_CLINIC_OR_DEPARTMENT_OTHER): Payer: Commercial Managed Care - PPO | Admitting: Adult Health

## 2016-12-13 VITALS — BP 127/67 | HR 64 | Temp 97.4°F | Resp 20 | Ht 69.0 in | Wt 183.4 lb

## 2016-12-13 DIAGNOSIS — C9000 Multiple myeloma not having achieved remission: Secondary | ICD-10-CM

## 2016-12-13 DIAGNOSIS — Z5112 Encounter for antineoplastic immunotherapy: Secondary | ICD-10-CM | POA: Diagnosis not present

## 2016-12-13 DIAGNOSIS — C9001 Multiple myeloma in remission: Secondary | ICD-10-CM

## 2016-12-13 DIAGNOSIS — Z9481 Bone marrow transplant status: Secondary | ICD-10-CM

## 2016-12-13 LAB — COMPREHENSIVE METABOLIC PANEL
ALT: 34 U/L (ref 0–55)
AST: 30 U/L (ref 5–34)
Albumin: 3.7 g/dL (ref 3.5–5.0)
Alkaline Phosphatase: 104 U/L (ref 40–150)
Anion Gap: 9 mEq/L (ref 3–11)
BUN: 17 mg/dL (ref 7.0–26.0)
CHLORIDE: 104 meq/L (ref 98–109)
CO2: 24 mEq/L (ref 22–29)
Calcium: 9.5 mg/dL (ref 8.4–10.4)
Creatinine: 1.1 mg/dL (ref 0.7–1.3)
EGFR: 67 mL/min/{1.73_m2} — AB (ref >90)
GLUCOSE: 89 mg/dL (ref 70–140)
Potassium: 4 mEq/L (ref 3.5–5.1)
SODIUM: 137 meq/L (ref 136–145)
Total Bilirubin: 0.4 mg/dL (ref 0.20–1.20)
Total Protein: 6.9 g/dL (ref 6.4–8.3)

## 2016-12-13 LAB — CBC WITH DIFFERENTIAL/PLATELET
BASO%: 0.9 % (ref 0.0–2.0)
BASOS ABS: 0 10*3/uL (ref 0.0–0.1)
EOS%: 9.8 % — AB (ref 0.0–7.0)
Eosinophils Absolute: 0.4 10*3/uL (ref 0.0–0.5)
HCT: 37.1 % — ABNORMAL LOW (ref 38.4–49.9)
HGB: 12.4 g/dL — ABNORMAL LOW (ref 13.0–17.1)
LYMPH%: 20.3 % (ref 14.0–49.0)
MCH: 33 pg (ref 27.2–33.4)
MCHC: 33.5 g/dL (ref 32.0–36.0)
MCV: 98.4 fL — ABNORMAL HIGH (ref 79.3–98.0)
MONO#: 0.8 10*3/uL (ref 0.1–0.9)
MONO%: 16.3 % — AB (ref 0.0–14.0)
NEUT#: 2.4 10*3/uL (ref 1.5–6.5)
NEUT%: 52.7 % (ref 39.0–75.0)
Platelets: 141 10*3/uL (ref 140–400)
RBC: 3.77 10*6/uL — AB (ref 4.20–5.82)
RDW: 22.3 % — ABNORMAL HIGH (ref 11.0–14.6)
WBC: 4.6 10*3/uL (ref 4.0–10.3)
lymph#: 0.9 10*3/uL (ref 0.9–3.3)

## 2016-12-13 MED ORDER — ZOLEDRONIC ACID 4 MG/100ML IV SOLN
4.0000 mg | Freq: Once | INTRAVENOUS | Status: AC
  Administered 2016-12-13: 4 mg via INTRAVENOUS
  Filled 2016-12-13: qty 100

## 2016-12-13 MED ORDER — PROCHLORPERAZINE MALEATE 10 MG PO TABS
ORAL_TABLET | ORAL | Status: AC
  Filled 2016-12-13: qty 1

## 2016-12-13 MED ORDER — SODIUM CHLORIDE 0.9 % IV SOLN
INTRAVENOUS | Status: DC
  Administered 2016-12-13: 13:00:00 via INTRAVENOUS

## 2016-12-13 MED ORDER — PROCHLORPERAZINE MALEATE 10 MG PO TABS
10.0000 mg | ORAL_TABLET | Freq: Once | ORAL | Status: AC
  Administered 2016-12-13: 10 mg via ORAL

## 2016-12-13 MED ORDER — BORTEZOMIB CHEMO SQ INJECTION 3.5 MG (2.5MG/ML)
1.3000 mg/m2 | Freq: Once | INTRAMUSCULAR | Status: AC
  Administered 2016-12-13: 2.5 mg via SUBCUTANEOUS
  Filled 2016-12-13: qty 2.5

## 2016-12-13 NOTE — Progress Notes (Signed)
ID: Andre [name]   DOB: [birth year]  MR#: [id number]  DGL#:[id number]   PCP: Andre [name] SU: OTHER MD: Andre [name], Andre [name], [address] [place], [place] [phone]), Andre [name]  CHIEF COMPLAINT: Multiple myeloma  CURRENT TREATMENT: Maintenance therapy with bortezomib, zolendronate, and pomalidomide  INTERVAL HISTORY:  Restarted Pomalidomide March 26.  He takes it 21 days in a row. He is due for Velcade and Zometa today. He recently missed his Velcade due to having had influenza and not feeling 100% from that.  He is feeling much better today.      REVIEW OF SYSTEMS: Andre [name] is doing well.  He denies fevers, chills, nausea, vomiting, constipation, diarrhea, peripheral neuropathy, abdominal pain.  He does note that his lower legs are slightly intermittently itchy since starting the pomalidomide, but doesn't note itching anywhere else.  A detailed ROS is otherwise non contributory.    HISTORY OF PRESENT ILLNESS:  From the original intake nodes:  The patient was worked up for peptic ulcer disease in August of 2011, with significant bleeding and anemia.  The patient was Helicobacter pylori negative.  He received some epinephrine when he had his EGD and then started on Protonix.  [name] anemia slowly resolved so that by September, his hemoglobin was up to 10.9 and by earlier this month, his hemoglobin was up to 12.5.    As part of his general workup, he was found to have a slightly elevated globulin fraction.  In September, his total protein was 8.3 with an albumin of 3.8.  In January, the total protein was 8.4 and albumin 3.6.  With persistence of this slight abnormality, Dr. Olevia [name] obtained serum immunofixation and SPEP.  The SPTP showed an M-spike of 2.67 grams.  A total IgG was 4,190.  Total IgA low at 28.  Total IgM low at 28 also.  The immunofixation showed a monoclonal IgG lambda paraprotein.  There were also monoclonal free lambda light chains  present. With this information, the patient was referred for further evaluation.   A diagnosis of myeloma was confirmed by bone marrow biopsy and subsequebnt treatment is as detailed below  PAST MEDICAL HISTORY: Past Medical History:  Diagnosis Date  . Allergy   . Anemia   . Arthritis   . Asthma    no treatment x 20 years  . Depression   . Double vision    occurs at times   . Duodenal ulcer   . GERD (gastroesophageal reflux disease)   . Hyperlipidemia   . Hypertension   . Hypothyroidism   . Multiple myeloma 07/04/2011  . Thyroid disease   Significant for peptic ulcer disease as noted above.  History of hyperlipidemia.  History of anxiety and depression.  History of GERD which is significantly improved with weight loss and history of reactive airway disease, possibly secondary to the GERD which also improved with weight loss.  There was a history of obesity, now much improved secondary to exercise and diet.   PAST SURGICAL HISTORY: Past Surgical History:  Procedure Laterality Date  . BONE MARROW TRANSPLANT  2011   for MM  . CARDIOLITE STUDY  11/25/2003   NORMAL  . TONSILLECTOMY      FAMILY HISTORY Family History  Problem Relation Age of Onset  . Throat cancer Mother   . Hypertension Father   . Stroke Father   . Asthma Father   . Diabetes Father   . Pancreatic cancer Brother   [name] father died at the  age of 67 following a stroke.  [name] mother died with cancer of the throat at the age of 24.   She had a history of depression and was a smoker.  Not clear how much alcohol she drank, according to the patient.  The patient had two brothers.  One died from the age of 49 from a "kidney problem."  The second one died at the age of 59 from pancreatic cancer. (This may have been duodenal cancer. 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  His wife of 37+ years was Andre [name], but they are now divorced. In 2016 he remarried, his current wife's name is Andre [name].  The patient has two daughters:  Andre [name], who works in [place], who is expecting her second child April 2016. .  They both live here in [place]. 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  The patient is very close to his daughters.  Andre [name] first daughter, Andre [name], born 03/06/2013, apparently has Weber/Osler/Rendu. The patient is not a church attender.  He does derive a great deal of support from friends at the "[employer]" which he attends very regularly, and also participates in Al-Anon even though actually there is no alcohol or drug problem in the family.  He participates in this group and he gets quite a bit of support from it.     ADVANCED DIRECTIVES: In place  HEALTH MAINTENANCE: (updated 07/16/2013) Social History  Substance Use Topics  . Smoking status: Former Smoker    Years: 3.00    Quit date: 03/27/1969  . Smokeless tobacco: Never Used  . Alcohol use No     Colonoscopy:  PSA:  Sept 2014, normal/Dr. [name]  Lipid panel: Sept 2014/Dr. [name]   Allergies  Allergen Reactions  . Atorvastatin Other (See Comments)  . Rosuvastatin     Other reaction(s): Liver Disorder  . Crestor [Rosuvastatin Calcium]     ADVERSE EFFECTS ON LIVER  . Lipitor [Atorvastatin Calcium]   . Septra [Sulfamethoxazole-Trimethoprim] Rash    Current Outpatient Prescriptions  Medication Sig Dispense Refill  . aspirin 325 MG tablet Take 325 mg by mouth daily.    . cetirizine (ZYRTEC) 10 MG tablet Take 10 mg by mouth daily.    [name] docusate sodium (COLACE) 100 MG capsule Take 100 mg by mouth daily as needed for mild constipation or moderate constipation.    [name] escitalopram (LEXAPRO) 10 MG tablet take 1 tablet by mouth once daily 30 tablet 1  . feeding supplement, ENSURE ENLIVE, (ENSURE ENLIVE)  LIQD Take 237 mLs by mouth 2 (two) times daily between meals. 237 mL 12  . levothyroxine (SYNTHROID, LEVOTHROID) 150 MCG tablet take 1 tablet by mouth every morning ON AN EMPTY STOMACH 90 tablet 1  . pantoprazole (PROTONIX) 40 MG tablet take 1 tablet by mouth twice a day before meals 60 tablet 3  . pomalidomide (POMALYST) 1 MG capsule Take 1 capsule (1 mg total) by mouth daily. Take with water on days 1-21. Repeat every 28 days. 21 capsule 0  . pomalidomide (POMALYST) 1 MG capsule Take 1 capsule (1 mg total) by mouth daily. Take with water on days 1-21. Repeat every 28 days. 21 capsule 0  . ranitidine (ZANTAC) 150 MG tablet Take 150 mg by mouth 2 (two) times daily.    [name]  sildenafil (VIAGRA) 25 MG tablet Take 1 tablet (25 mg total) by mouth daily as needed for erectile dysfunction. 10 tablet 0  . valACYclovir (VALTREX) 1000 MG tablet Take 1 tablet (1,000 mg total) by mouth daily. (Patient taking differently: Take 500 mg by mouth daily. ) 60 tablet 6  . zolendronic acid (ZOMETA) 4 MG/5ML injection Inject 4 mg into the vein every 3 (three) months.    . zolpidem (AMBIEN) 5 MG tablet Take 1-2 tablets (5-10 mg total) by mouth at bedtime as needed for sleep. 30 tablet 1   No current facility-administered medications for this visit.    Facility-Administered Medications Ordered in Other Visits  Medication Dose Route Frequency Provider Last Rate Last Dose  . 0.9 %  sodium chloride infusion   Intravenous Once Chauncey Cruel, MD        Objective: Middle aged white man in no acute distress    BP: 136/66  Pulse: 62  Resp: 18  Temp: 97.6 F (36.4 C)   Body mass index is 27.69 kg/m.  Filed Weights   08/15/16 0954  Weight: 187 lb 8 oz (85 kg)  HEENT:  Sclerae anicteric.  Oropharynx clear and moist. No ulcerations or evidence of oropharyngeal candidiasis. Neck is supple. PERRL NODES:  No cervical, supraclavicular, or axillary lymphadenopathy palpated.  LUNGS:  Clear to auscultation bilaterally.  No  wheezes or rhonchi. HEART:  Regular rate and rhythm. No murmur appreciated. ABDOMEN:  Soft, nontender.  Positive, normoactive bowel sounds. No organomegaly palpated. MSK:  No focal spinal tenderness to palpation. Full range of motion bilaterally in the upper extremities. EXTREMITIES:  WWP, no peripheral edema   SKIN:  Clear with no obvious rashes or skin changes. NEURO:  Nonfocal. Well oriented.  Appropriate affect.      LAB RESULTS:     Ref Range & Units 8d ago (08/07/16) 749moago (07/11/16) 174mogo (07/11/16) 49m40moo (06/19/16)   Total Protein 6.0 - 8.5 g/dL 6.7  6.2      Albumin 2.9 - 4.4 g/dL 3.5  3.3  3.4R   3.4R     Alpha 1 0.0 - 0.4 g/dL 0.2  0.3      Alpha 2 0.4 - 1.0 g/dL 0.8  0.7      Beta 0.7 - 1.3 g/dL 1.2  1.1      Gamma Globulin 0.4 - 1.8 g/dL 0.9  0.8      M-Spike, % Not Observed g/dL Not Obs...  Not Obs...     Not Observed   GLOBULIN, TOTAL 2.2 - 3.9 g/dL 3.2  2.9      A/G Ratio 0.7 - 1.7 1.1  1.1      Please Note:  Comment  CommentCM          Ref Range & Units 8d ago 49mo85mo 2mo 89mo59mo a22mo  Ig Kappa Free Light Chain 3.3 - 19.4 mg/L 35.8   22.0   21.4   22.7     Ig Lambda Free Light Chain 5.7 - 26.3 mg/L 20.6  13.3  19.2  24.6    Kappa/Lambda FluidC Ratio 0.26 - 1.65 1.74   1.65  1.11  0.92    No visits with results within 1 Day(s) from this visit.  Latest known visit with results is:  Hospital Outpatient Visit on 11/20/2016  Component Date Value Ref Range Status  . Influenza A By PCR 11/20/2016 NEGATIVE  NEGATIVE Final  . Influenza Voris By PCR 11/20/2016 POSITIVE*  NEGATIVE Final   Comment: (NOTE) The Xpert Xpress Flu assay is intended as an aid in the diagnosis of  influenza and should not be used as a sole basis for treatment.  This  assay is FDA approved for nasopharyngeal swab specimens only. Nasal  washings and aspirates are unacceptable for Xpert Xpress Flu testing.        STUDIES: No results found.   ASSESSMENT: 66 y.o.  [place] man  with a history of multiple myeloma diagnosed February of 2012, with an initial M-spike of 2.6 g/dL, IFE showing an IgG lambda paraprotein and free lambda light chains in the urine. Cytogenetics showed trisomy 56. Bone marrow biopsy showed a 22% plasmacytosis. Treated with   (1) bortezomib (subcutaneously), lenalidomide, and dexamethasone, with repeat bone marrow biopsy May of 2012 showing 10% plasmacytosis   (2) high-dose chemotherapy with BCNU and melphalan at [facility], followed by stem cell rescue July of 2012   (3) on zoledronic acid started December of 2012, initially monthly, currently given every 3 months, most recent dose  12/07/2015  (4) low-dose lenalidomide resumed April 2013, interrupted several times.  Resumed again on 02/19/2013, ata dose of 5 mg daily, 21 days on and 7 days off, later further reduced to 7 days on, 7 days off  (5) CNS symptoms and abnormal brain MRI extensively evaluated by neurology with no definitive diagnosis established  (6) rising M spike noted June 2014 but did not meet criteria for carfilzomib study  (7) on lenalidomide 25 mg daily, 14 days on, 7 days off, starting 04/18/2013, interrupted December 2014 because of rash;   (a) resumed January 2015 at 10 mg/ day at 21 days on/ 7 days off  (Kalil) starting 08/18/2014 decreased to 10 mg/ day 14 days on, 7 days off because of cytopenias  (c) as of February 2016 was on 5 mg daily 7 days on 7 days off  (d) lenalidomide discontinued December 2016 with evidence of disease progression  (8) transient global amnesia 05/29/2015, resolved without intervention  (9) starting PVD 10/25/2015 w ASA 325 thromboprophylaxis, valacyclovir prophylaxis, last dose 12/17/2015  (a) pomalidomide 4 mg/d days 1-14  (Hezekiah) bortezomib sQ days 2,5,9,12 of each 21 day cycle  (c) dexamethasome 20 mg two days a week  (10) metapneumovirus pneumonia April 2017  (a) completing course of steroids and week of bactrim mid April 2017  (11) status post  second autologous transplant at [facility] 02/04/2016(preparatory regimen melphalan 200 mg/m)  (12) maintenance therapy started November 2017, consisting of  (a) bortezomib 1.3 mg/M2 QOW, first dose 07/27/2016  (Devynn) pomalidomide 1 mg days 1-21 Q28 days (increase to 2 mg as tolerated) started 07/19/2016  (c) zolendronate monthly started 07/27/2016 (previously Q 3 months)  PLAN:  Doing well.  Labs are normal.  Andre [name] will proceed with Bortezomib and monthly Zolendronate today.  He will go back to his every other week Bortezomib schedule.  I requested appointments be made for the next couple of months.  He verbalized understanding of the plan and is in agreement with it.  He knows to call for any questions or concerns whatsoever.   A total of (20) minutes of face-to-face time was spent with this patient with greater than 50% of that time in counseling and care-coordination.   Gardenia Phlegm, NP 12/13/16

## 2016-12-14 ENCOUNTER — Other Ambulatory Visit: Payer: Self-pay | Admitting: *Deleted

## 2016-12-15 LAB — IMMUNOFIXATION ELECTROPHORESIS
IgA, Qn, Serum: 161 mg/dL (ref 61–437)
IgG, Qn, Serum: 935 mg/dL (ref 700–1600)
IgM, Qn, Serum: 36 mg/dL (ref 20–172)
Total Protein: 6.7 g/dL (ref 6.0–8.5)

## 2016-12-21 ENCOUNTER — Telehealth: Payer: Self-pay | Admitting: Adult Health

## 2016-12-21 ENCOUNTER — Telehealth: Payer: Self-pay

## 2016-12-21 NOTE — Telephone Encounter (Signed)
Called pt per [name] NP. Instructed him to go to urgent care or PCP if he gets worse. It may be influenza A. Also to stop Pomalidomide until he fells better. Pt was appreciative.

## 2016-12-21 NOTE — Telephone Encounter (Signed)
Patient not feeling well.  Recommend he either go to urgent care, or pcp if worsens as he may have influenza a.  Would stop pomalidomide until feeling better.    Wilber Bihari, NP

## 2016-12-27 ENCOUNTER — Other Ambulatory Visit (HOSPITAL_BASED_OUTPATIENT_CLINIC_OR_DEPARTMENT_OTHER): Payer: Commercial Managed Care - PPO

## 2016-12-27 ENCOUNTER — Ambulatory Visit (HOSPITAL_BASED_OUTPATIENT_CLINIC_OR_DEPARTMENT_OTHER): Payer: Commercial Managed Care - PPO

## 2016-12-27 ENCOUNTER — Other Ambulatory Visit: Payer: Self-pay | Admitting: Oncology

## 2016-12-27 ENCOUNTER — Telehealth: Payer: Self-pay | Admitting: Pharmacist

## 2016-12-27 VITALS — BP 109/66 | HR 56 | Temp 98.4°F | Resp 18

## 2016-12-27 DIAGNOSIS — C9000 Multiple myeloma not having achieved remission: Secondary | ICD-10-CM

## 2016-12-27 DIAGNOSIS — Z9481 Bone marrow transplant status: Secondary | ICD-10-CM

## 2016-12-27 DIAGNOSIS — Z5112 Encounter for antineoplastic immunotherapy: Secondary | ICD-10-CM

## 2016-12-27 LAB — CBC WITH DIFFERENTIAL/PLATELET
BASO%: 0.6 % (ref 0.0–2.0)
BASOS ABS: 0 10*3/uL (ref 0.0–0.1)
EOS ABS: 0.3 10*3/uL (ref 0.0–0.5)
EOS%: 6 % (ref 0.0–7.0)
HCT: 37.7 % — ABNORMAL LOW (ref 38.4–49.9)
HGB: 12.7 g/dL — ABNORMAL LOW (ref 13.0–17.1)
LYMPH%: 20.5 % (ref 14.0–49.0)
MCH: 33.9 pg — AB (ref 27.2–33.4)
MCHC: 33.8 g/dL (ref 32.0–36.0)
MCV: 100.3 fL — AB (ref 79.3–98.0)
MONO#: 0.7 10*3/uL (ref 0.1–0.9)
MONO%: 13.9 % (ref 0.0–14.0)
NEUT#: 3.1 10*3/uL (ref 1.5–6.5)
NEUT%: 59 % (ref 39.0–75.0)
PLATELETS: 123 10*3/uL — AB (ref 140–400)
RBC: 3.75 10*6/uL — AB (ref 4.20–5.82)
RDW: 23.2 % — ABNORMAL HIGH (ref 11.0–14.6)
WBC: 5.3 10*3/uL (ref 4.0–10.3)
lymph#: 1.1 10*3/uL (ref 0.9–3.3)

## 2016-12-27 LAB — COMPREHENSIVE METABOLIC PANEL
ALT: 57 U/L — ABNORMAL HIGH (ref 0–55)
ANION GAP: 8 meq/L (ref 3–11)
AST: 42 U/L — ABNORMAL HIGH (ref 5–34)
Albumin: 3.4 g/dL — ABNORMAL LOW (ref 3.5–5.0)
Alkaline Phosphatase: 124 U/L (ref 40–150)
BILIRUBIN TOTAL: 0.4 mg/dL (ref 0.20–1.20)
BUN: 17.2 mg/dL (ref 7.0–26.0)
CO2: 24 meq/L (ref 22–29)
Calcium: 9.5 mg/dL (ref 8.4–10.4)
Chloride: 105 mEq/L (ref 98–109)
Creatinine: 1 mg/dL (ref 0.7–1.3)
EGFR: 74 mL/min/{1.73_m2} — AB (ref >90)
Glucose: 116 mg/dl (ref 70–140)
POTASSIUM: 4.5 meq/L (ref 3.5–5.1)
SODIUM: 137 meq/L (ref 136–145)
TOTAL PROTEIN: 6.7 g/dL (ref 6.4–8.3)

## 2016-12-27 MED ORDER — PROCHLORPERAZINE MALEATE 10 MG PO TABS
10.0000 mg | ORAL_TABLET | Freq: Once | ORAL | Status: AC
  Administered 2016-12-27: 10 mg via ORAL

## 2016-12-27 MED ORDER — PROCHLORPERAZINE MALEATE 10 MG PO TABS
ORAL_TABLET | ORAL | Status: AC
  Filled 2016-12-27: qty 1

## 2016-12-27 MED ORDER — BORTEZOMIB CHEMO SQ INJECTION 3.5 MG (2.5MG/ML)
1.3000 mg/m2 | Freq: Once | INTRAMUSCULAR | Status: AC
  Administered 2016-12-27: 2.5 mg via SUBCUTANEOUS
  Filled 2016-12-27: qty 2.5

## 2016-12-27 NOTE — Patient Instructions (Signed)
[facility] Discharge Instructions for Patients Receiving Chemotherapy  Today you received the following chemotherapy agents Velcade. To help prevent nausea and vomiting after your treatment, we encourage you to take your nausea medication as directed.  If you develop nausea and vomiting that is not controlled by your nausea medication, call the clinic.   BELOW ARE SYMPTOMS THAT SHOULD BE REPORTED IMMEDIATELY:  *FEVER GREATER THAN 100.5 F  *CHILLS WITH OR WITHOUT FEVER  NAUSEA AND VOMITING THAT IS NOT CONTROLLED WITH YOUR NAUSEA MEDICATION  *UNUSUAL SHORTNESS OF BREATH  *UNUSUAL BRUISING OR BLEEDING  TENDERNESS IN MOUTH AND THROAT WITH OR WITHOUT PRESENCE OF ULCERS  *URINARY PROBLEMS  *BOWEL PROBLEMS  UNUSUAL RASH Items with * indicate a potential emergency and should be followed up as soon as possible.  Feel free to call the clinic you have any questions or concerns. The clinic phone number is [phone].  Please show the CHEMO ALERT CARD at check-in to the Emergency Department and triage nurse.    

## 2016-12-27 NOTE — Telephone Encounter (Signed)
I called [facility] team (Ph# [phone]) & left a vm for Kathryne Eriksson, coordinator re: pneumovax vaccination due? Pt saw Dr. Alvie Heidelberg on 06/14/16 & at that visit, she planned for pt to receive pneumovax at 6 mos, which could mean end of March 2018. Pt stated today he has not received pneumovax & our vaccine records align with that as well. Dr. [name] is in agreement w/ pt receiving vaccines at [facility], however we need [facility] to clarify if pt needs Prevnar-13 or Pneumovax-23. Pt is aware & in agreement w/ plan once we receive a call back from [facility].   Pt returns in 2 weeks for next Velcade.  We can give vaccine then if we have an answer by then.  Kennith Center, Pharm.D., CPP 12/27/2016@1 :23 PM

## 2016-12-27 NOTE — Progress Notes (Signed)
1329: per Dr. [name] okay to proceed with treatment prior to Cmet results.

## 2016-12-27 NOTE — Telephone Encounter (Signed)
Received call back from Kathryne Eriksson at [place].  No vaccines at this time. Dr. Alvie Heidelberg will have pt vaccinated when he is there next in June 2018.  Kennith Center, Pharm.D., CPP 12/27/2016@2 :34 PM

## 2017-01-03 ENCOUNTER — Other Ambulatory Visit: Payer: Self-pay | Admitting: *Deleted

## 2017-01-03 MED ORDER — POMALIDOMIDE 1 MG PO CAPS: 1.0000 mg | ORAL_CAPSULE | Freq: Every day | ORAL | 0 refills | Status: DC

## 2017-01-10 ENCOUNTER — Ambulatory Visit (HOSPITAL_BASED_OUTPATIENT_CLINIC_OR_DEPARTMENT_OTHER): Payer: Commercial Managed Care - PPO | Admitting: Adult Health

## 2017-01-10 ENCOUNTER — Other Ambulatory Visit: Payer: Self-pay | Admitting: Oncology

## 2017-01-10 ENCOUNTER — Other Ambulatory Visit (HOSPITAL_BASED_OUTPATIENT_CLINIC_OR_DEPARTMENT_OTHER): Payer: Commercial Managed Care - PPO

## 2017-01-10 ENCOUNTER — Ambulatory Visit (HOSPITAL_BASED_OUTPATIENT_CLINIC_OR_DEPARTMENT_OTHER): Payer: Commercial Managed Care - PPO

## 2017-01-10 VITALS — BP 133/79 | HR 60 | Temp 97.6°F | Resp 18 | Ht 69.0 in | Wt 186.7 lb

## 2017-01-10 DIAGNOSIS — Z9481 Bone marrow transplant status: Secondary | ICD-10-CM

## 2017-01-10 DIAGNOSIS — C9000 Multiple myeloma not having achieved remission: Secondary | ICD-10-CM

## 2017-01-10 DIAGNOSIS — Z5112 Encounter for antineoplastic immunotherapy: Secondary | ICD-10-CM

## 2017-01-10 LAB — CBC WITH DIFFERENTIAL/PLATELET
BASO%: 1.1 % (ref 0.0–2.0)
Basophils Absolute: 0.1 10*3/uL (ref 0.0–0.1)
EOS%: 8.5 % — ABNORMAL HIGH (ref 0.0–7.0)
Eosinophils Absolute: 0.5 10*3/uL (ref 0.0–0.5)
HCT: 38.7 % (ref 38.4–49.9)
HEMOGLOBIN: 13.1 g/dL (ref 13.0–17.1)
LYMPH#: 1.5 10*3/uL (ref 0.9–3.3)
LYMPH%: 28.6 % (ref 14.0–49.0)
MCH: 34.9 pg — ABNORMAL HIGH (ref 27.2–33.4)
MCHC: 33.7 g/dL (ref 32.0–36.0)
MCV: 103.6 fL — ABNORMAL HIGH (ref 79.3–98.0)
MONO#: 1 10*3/uL — AB (ref 0.1–0.9)
MONO%: 19.2 % — ABNORMAL HIGH (ref 0.0–14.0)
NEUT%: 42.6 % (ref 39.0–75.0)
NEUTROS ABS: 2.3 10*3/uL (ref 1.5–6.5)
PLATELETS: 146 10*3/uL (ref 140–400)
RBC: 3.74 10*6/uL — AB (ref 4.20–5.82)
RDW: 22.7 % — AB (ref 11.0–14.6)
WBC: 5.4 10*3/uL (ref 4.0–10.3)

## 2017-01-10 LAB — COMPREHENSIVE METABOLIC PANEL
ALBUMIN: 3.6 g/dL (ref 3.5–5.0)
ALK PHOS: 131 U/L (ref 40–150)
ALT: 43 U/L (ref 0–55)
ANION GAP: 10 meq/L (ref 3–11)
AST: 34 U/L (ref 5–34)
BILIRUBIN TOTAL: 0.31 mg/dL (ref 0.20–1.20)
BUN: 19.1 mg/dL (ref 7.0–26.0)
CALCIUM: 10.2 mg/dL (ref 8.4–10.4)
CO2: 25 mEq/L (ref 22–29)
CREATININE: 1.2 mg/dL (ref 0.7–1.3)
Chloride: 104 mEq/L (ref 98–109)
EGFR: 65 mL/min/{1.73_m2} — ABNORMAL LOW (ref >90)
Glucose: 92 mg/dl (ref 70–140)
Potassium: 5 mEq/L (ref 3.5–5.1)
Sodium: 140 mEq/L (ref 136–145)
TOTAL PROTEIN: 7.3 g/dL (ref 6.4–8.3)

## 2017-01-10 MED ORDER — BORTEZOMIB CHEMO SQ INJECTION 3.5 MG (2.5MG/ML)
1.3000 mg/m2 | Freq: Once | INTRAMUSCULAR | Status: AC
  Administered 2017-01-10: 2.5 mg via SUBCUTANEOUS
  Filled 2017-01-10: qty 2.5

## 2017-01-10 MED ORDER — PROCHLORPERAZINE MALEATE 10 MG PO TABS: 10.0000 mg | ORAL_TABLET | Freq: Once | ORAL | Status: DC

## 2017-01-10 NOTE — Progress Notes (Signed)
ID: Andre [name]   DOB: Aug 19, 1951  MR#: [id number]  EPP#:[id number]   PCP: Andre [name] SU: OTHER MD: Andre [name], Andre [name], [address] [place] [phone]), Andre [name]  CHIEF COMPLAINT: Multiple myeloma  CURRENT TREATMENT: Maintenance therapy with bortezomib, zolendronate, and pomalidomide  INTERVAL HISTORY:  Andre [name] is doing well today.  He is on his off week of Pomalidomide.  He doesn't like taking the pomalidomide due to fatiuge and constipation.  He is having bowel movements daily, however his stools are harder on days he is taking pomalidomide.  He also wants to take Zometa once every three months instead of once a month, due to bone pain and flu like symptoms.  He also says Bortezomib contributes to fatigue, but it is only the evening after.     REVIEW OF SYSTEMS: Andre [name] is continuing to exercise daily.  A detailed ROS is otherwise non contributory.    HISTORY OF PRESENT ILLNESS:  From the original intake nodes:  The patient was worked up for peptic ulcer disease in August of 2011, with significant bleeding and anemia.  The patient was Helicobacter pylori negative.  He received some epinephrine when he had his EGD and then started on Protonix.  [name] anemia slowly resolved so that by September, his hemoglobin was up to 10.9 and by earlier this month, his hemoglobin was up to 12.5.    As part of his general workup, he was found to have a slightly elevated globulin fraction.  In September, his total protein was 8.3 with an albumin of 3.8.  In January, the total protein was 8.4 and albumin 3.6.  With persistence of this slight abnormality, Dr. Olevia Perches obtained serum immunofixation and SPEP.  The SPTP showed an M-spike of 2.67 grams.  A total IgG was 4,190.  Total IgA low at 28.  Total IgM low at 28 also.  The immunofixation showed a monoclonal IgG lambda paraprotein.  There were also monoclonal free lambda light chains present. With this  information, the patient was referred for further evaluation.   A diagnosis of myeloma was confirmed by bone marrow biopsy and subsequebnt treatment is as detailed below  PAST MEDICAL HISTORY: Past Medical History:  Diagnosis Date  . Allergy   . Anemia   . Arthritis   . Asthma    no treatment x 20 years  . Depression   . Double vision    occurs at times   . Duodenal ulcer   . GERD (gastroesophageal reflux disease)   . Hyperlipidemia   . Hypertension   . Hypothyroidism   . Multiple myeloma 07/04/2011  . Thyroid disease   Significant for peptic ulcer disease as noted above.  History of hyperlipidemia.  History of anxiety and depression.  History of GERD which is significantly improved with weight loss and history of reactive airway disease, possibly secondary to the GERD which also improved with weight loss.  There was a history of obesity, now much improved secondary to exercise and diet.   PAST SURGICAL HISTORY: Past Surgical History:  Procedure Laterality Date  . BONE MARROW TRANSPLANT  2011   for MM  . CARDIOLITE STUDY  11/25/2003   NORMAL  . TONSILLECTOMY      FAMILY HISTORY Family History  Problem Relation Age of Onset  . Throat cancer Mother   . Hypertension Father   . Stroke Father   . Asthma Father   . Diabetes Father   . Pancreatic cancer Brother   The  patient's father died at the age of 72 following a stroke.  [name] mother died with cancer of the throat at the age of 57.   She had a history of depression and was a smoker.  Not clear how much alcohol she drank, according to the patient.  The patient had two brothers.  One died from the age of 79 from a "kidney problem."  The second one died at the age of 13 from pancreatic cancer. (This may have been duodenal cancer. 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  His wife of 37+ years was Andre [name], but they are now divorced. In 2016 he remarried, his current wife's name is Andre [name].  The patient has two daughters:  Andre [name], who works in [place], who is expecting her second child April 2016. .  They both live here in [place]. 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  The patient is very close to his daughters.  [place] first daughter, Andre [name], born 03/06/2013, apparently has Weber/Osler/Rendu. The patient is not a church attender.  He does derive a great deal of support from friends at the "[employer]" which he attends very regularly, and also participates in Al-Anon even though actually there is no alcohol or drug problem in the family.  He participates in this group and he gets quite a bit of support from it.     ADVANCED DIRECTIVES: In place  HEALTH MAINTENANCE: (updated 07/16/2013) Social History  Substance Use Topics  . Smoking status: Former Smoker    Years: 3.00    Quit date: 03/27/1969  . Smokeless tobacco: Never Used  . Alcohol use No     Colonoscopy:  PSA:  Sept 2014, normal/Dr. [name]  Lipid panel: Sept 2014/Dr. [name]   Allergies  Allergen Reactions  . Atorvastatin Other (See Comments)  . Rosuvastatin     Other reaction(s): Liver Disorder  . Crestor [Rosuvastatin Calcium]     ADVERSE EFFECTS ON LIVER  . Lipitor [Atorvastatin Calcium]   . Septra [Sulfamethoxazole-Trimethoprim] Rash    Current Outpatient Prescriptions  Medication Sig Dispense Refill  . aspirin 325 MG tablet Take 325 mg by mouth daily.    . cetirizine (ZYRTEC) 10 MG tablet Take 10 mg by mouth daily.    [name] docusate sodium (COLACE) 100 MG capsule Take 100 mg by mouth daily as needed for mild constipation or moderate constipation.    [name] escitalopram (LEXAPRO) 10 MG tablet take 1 tablet by mouth once daily 30 tablet 1  . feeding supplement, ENSURE ENLIVE, (ENSURE ENLIVE) LIQD Take 237 mLs  by mouth 2 (two) times daily between meals. 237 mL 12  . levothyroxine (SYNTHROID, LEVOTHROID) 150 MCG tablet take 1 tablet by mouth every morning ON AN EMPTY STOMACH 90 tablet 1  . pantoprazole (PROTONIX) 40 MG tablet take 1 tablet by mouth twice a day before meals 60 tablet 3  . pomalidomide (POMALYST) 1 MG capsule Take 1 capsule (1 mg total) by mouth daily. Take with water on days 1-21. Repeat every 28 days. 21 capsule 0  . pomalidomide (POMALYST) 1 MG capsule Take 1 capsule (1 mg total) by mouth daily. Take with water on days 1-21. Repeat every 28 days. 21 capsule 0  . ranitidine (ZANTAC) 150 MG tablet Take 150 mg by mouth  2 (two) times daily.    . sildenafil (VIAGRA) 25 MG tablet Take 1 tablet (25 mg total) by mouth daily as needed for erectile dysfunction. 10 tablet 0  . valACYclovir (VALTREX) 1000 MG tablet Take 1 tablet (1,000 mg total) by mouth daily. (Patient taking differently: Take 500 mg by mouth daily. ) 60 tablet 6  . zolendronic acid (ZOMETA) 4 MG/5ML injection Inject 4 mg into the vein every 3 (three) months.    . zolpidem (AMBIEN) 5 MG tablet Take 1-2 tablets (5-10 mg total) by mouth at bedtime as needed for sleep. 30 tablet 1   No current facility-administered medications for this visit.    Facility-Administered Medications Ordered in Other Visits  Medication Dose Route Frequency Provider Last Rate Last Dose  . 0.9 %  sodium chloride infusion   Intravenous Once Chauncey Cruel, MD        Objective: Middle aged white man in no acute distress    BP: 136/66  Pulse: 62  Resp: 18  Temp: 97.6 F (36.4 C)   Body mass index is 27.69 kg/m.  Filed Weights   08/15/16 0954  Weight: 187 lb 8 oz (85 kg)  HEENT:  Sclerae anicteric.  Oropharynx clear and moist. No ulcerations or evidence of oropharyngeal candidiasis. Neck is supple. PERRL NODES:  No cervical, supraclavicular, or axillary lymphadenopathy palpated.  LUNGS:  Clear to auscultation bilaterally.  No wheezes or  rhonchi. HEART:  Regular rate and rhythm. No murmur appreciated. ABDOMEN:  Soft, nontender.  Positive, normoactive bowel sounds. No organomegaly palpated. MSK:  No focal spinal tenderness to palpation.  EXTREMITIES:  WWP, no peripheral edema   SKIN:  Clear with no obvious rashes or skin changes. NEURO:  Nonfocal. Well oriented.  Appropriate affect.      LAB RESULTS:     Ref Range & Units 8d ago (08/07/16) 77moago (07/11/16) 168mogo (07/11/16) 1m16moo (06/19/16)   Total Protein 6.0 - 8.5 g/dL 6.7  6.2      Albumin 2.9 - 4.4 g/dL 3.5  3.3  3.4R   3.4R     Alpha 1 0.0 - 0.4 g/dL 0.2  0.3      Alpha 2 0.4 - 1.0 g/dL 0.8  0.7      Beta 0.7 - 1.3 g/dL 1.2  1.1      Gamma Globulin 0.4 - 1.8 g/dL 0.9  0.8      M-Spike, % Not Observed g/dL Not Obs...  Not Obs...     Not Observed   GLOBULIN, TOTAL 2.2 - 3.9 g/dL 3.2  2.9      A/G Ratio 0.7 - 1.7 1.1  1.1      Please Note:  Comment  CommentCM          Ref Range & Units 8d ago 6mo65mo 26mo 50mo63mo a84mo  Ig Kappa Free Light Chain 3.3 - 19.4 mg/L 35.8   22.0   21.4   22.7     Ig Lambda Free Light Chain 5.7 - 26.3 mg/L 20.6  13.3  19.2  24.6    Kappa/Lambda FluidC Ratio 0.26 - 1.65 1.74   1.65  1.11  0.92    No visits with results within 1 Day(s) from this visit.  Latest known visit with results is:  Appointment on 12/27/2016  Component Date Value Ref Range Status  . WBC 12/27/2016 5.3  4.0 - 10.3 10e3/uL Final  . NEUT# 12/27/2016 3.1  1.5 - 6.5 10e3/uL  Final  . HGB 12/27/2016 12.7* 13.0 - 17.1 g/dL Final  . HCT 12/27/2016 37.7* 38.4 - 49.9 % Final  . Platelets 12/27/2016 123* 140 - 400 10e3/uL Final  . MCV 12/27/2016 100.3* 79.3 - 98.0 fL Final  . MCH 12/27/2016 33.9* 27.2 - 33.4 pg Final  . MCHC 12/27/2016 33.8  32.0 - 36.0 g/dL Final  . RBC 12/27/2016 3.75* 4.20 - 5.82 10e6/uL Final  . RDW 12/27/2016 23.2* 11.0 - 14.6 % Final  . lymph# 12/27/2016 1.1  0.9 - 3.3 10e3/uL Final  . MONO# 12/27/2016 0.7  0.1 - 0.9 10e3/uL Final    . Eosinophils Absolute 12/27/2016 0.3  0.0 - 0.5 10e3/uL Final  . Basophils Absolute 12/27/2016 0.0  0.0 - 0.1 10e3/uL Final  . NEUT% 12/27/2016 59.0  39.0 - 75.0 % Final  . LYMPH% 12/27/2016 20.5  14.0 - 49.0 % Final  . MONO% 12/27/2016 13.9  0.0 - 14.0 % Final  . EOS% 12/27/2016 6.0  0.0 - 7.0 % Final  . BASO% 12/27/2016 0.6  0.0 - 2.0 % Final  . Sodium 12/27/2016 137  136 - 145 mEq/L Final  . Potassium 12/27/2016 4.5  3.5 - 5.1 mEq/L Final  . Chloride 12/27/2016 105  98 - 109 mEq/L Final  . CO2 12/27/2016 24  22 - 29 mEq/L Final  . Glucose 12/27/2016 116  70 - 140 mg/dl Final  . BUN 12/27/2016 17.2  7.0 - 26.0 mg/dL Final  . Creatinine 12/27/2016 1.0  0.7 - 1.3 mg/dL Final  . Total Bilirubin 12/27/2016 0.40  0.20 - 1.20 mg/dL Final  . Alkaline Phosphatase 12/27/2016 124  40 - 150 U/L Final  . AST 12/27/2016 42* 5 - 34 U/L Final  . ALT 12/27/2016 57* 0 - 55 U/L Final  . Total Protein 12/27/2016 6.7  6.4 - 8.3 g/dL Final  . Albumin 12/27/2016 3.4* 3.5 - 5.0 g/dL Final  . Calcium 12/27/2016 9.5  8.4 - 10.4 mg/dL Final  . Anion Gap 12/27/2016 8  3 - 11 mEq/L Final  . EGFR 12/27/2016 74* >90 ml/min/1.73 m2 Final       STUDIES: No results found.   ASSESSMENT: 66 y.o.  [place] man with a history of multiple myeloma diagnosed February of 2012, with an initial M-spike of 2.6 g/dL, IFE showing an IgG lambda paraprotein and free lambda light chains in the urine. Cytogenetics showed trisomy 60. Bone marrow biopsy showed a 22% plasmacytosis. Treated with   (1) bortezomib (subcutaneously), lenalidomide, and dexamethasone, with repeat bone marrow biopsy May of 2012 showing 10% plasmacytosis   (2) high-dose chemotherapy with BCNU and melphalan at [facility], followed by stem cell rescue July of 2012   (3) on zoledronic acid started December of 2012, initially monthly, currently given every 3 months, most recent dose  12/07/2015  (4) low-dose lenalidomide resumed April 2013, interrupted  several times.  Resumed again on 02/19/2013, ata dose of 5 mg daily, 21 days on and 7 days off, later further reduced to 7 days on, 7 days off  (5) CNS symptoms and abnormal brain MRI extensively evaluated by neurology with no definitive diagnosis established  (6) rising M spike noted June 2014 but did not meet criteria for carfilzomib study  (7) on lenalidomide 25 mg daily, 14 days on, 7 days off, starting 04/18/2013, interrupted December 2014 because of rash;   (a) resumed January 2015 at 10 mg/ day at 21 days on/ 7 days off  (Andrzej) starting 08/18/2014 decreased to 10 mg/  day 14 days on, 7 days off because of cytopenias  (c) as of February 2016 was on 5 mg daily 7 days on 7 days off  (d) lenalidomide discontinued December 2016 with evidence of disease progression  (8) transient global amnesia 05/29/2015, resolved without intervention  (9) starting PVD 10/25/2015 w ASA 325 thromboprophylaxis, valacyclovir prophylaxis, last dose 12/17/2015  (a) pomalidomide 4 mg/d days 1-14  (Yohan) bortezomib sQ days 2,5,9,12 of each 21 day cycle  (c) dexamethasome 20 mg two days a week  (10) metapneumovirus pneumonia April 2017  (a) completing course of steroids and week of bactrim mid April 2017  (11) status post second autologous transplant at [facility] 02/04/2016(preparatory regimen melphalan 200 mg/m)  (12) maintenance therapy started November 2017, consisting of  (a) bortezomib 1.3 mg/M2 QOW, first dose 07/27/2016  ([place]) pomalidomide 1 mg days 1-21 Q28 days (increase to 2 mg as tolerated) started 07/19/2016  (c) zolendronate monthly started 07/27/2016 (previously Q 3 months) however patient unablet to tolerate, and changed to q3  month in April, 2018 (Due in June, 2018)  PLAN:  Andre [name] will receive Bortezomib today.  He will restart the Pomalidomide next week when due.  He will change back to Zometa every three months.   He will have his myeloma labs retested in 4 weeks, and see Dr. [name] in 6 weeks.  He will  continue to return for Bortezomib every other week.  This was reviewed with patient by myself and Dr. [name].     A total of (20) minutes of face-to-face time was spent with this patient with greater than 50% of that time in counseling and care-coordination.   Gardenia Phlegm, NP 01/10/17   ADDENDUM: Andre [name] is doing quite well as far as his myeloma is concerned. There is some question of tolerance of treatment. I think he would do better if his Zometa were given every 3 months and that is his very strong preference. I do think it is prudent on his part to continue the pomalidomide even though he does have some fatigue and general malaise from that medication. The alternatives are all associated with more side effects.  He is doing a bit better as far as his constipation issue is concerned and continues to have a good functional status although I am concerned about some difficulties making decisions or getting clarity on issues. He also admits to forgetfulness. For now however he continues to work full-time.  I personally saw this patient and performed a substantive portion of this encounter with the listed APP documented above.   Chauncey Cruel, MD Medical Oncology and Hematology [facility] [address] [place], [place] [address] Tel. [phone]    Fax. [phone]

## 2017-01-10 NOTE — Patient Instructions (Signed)
[facility] Discharge Instructions for Patients Receiving Chemotherapy  Today you received the following chemotherapy agents Velcade  To help prevent nausea and vomiting after your treatment, we encourage you to take your nausea medication    If you develop nausea and vomiting that is not controlled by your nausea medication, call the clinic.   BELOW ARE SYMPTOMS THAT SHOULD BE REPORTED IMMEDIATELY:  *FEVER GREATER THAN 100.5 F  *CHILLS WITH OR WITHOUT FEVER  NAUSEA AND VOMITING THAT IS NOT CONTROLLED WITH YOUR NAUSEA MEDICATION  *UNUSUAL SHORTNESS OF BREATH  *UNUSUAL BRUISING OR BLEEDING  TENDERNESS IN MOUTH AND THROAT WITH OR WITHOUT PRESENCE OF ULCERS  *URINARY PROBLEMS  *BOWEL PROBLEMS  UNUSUAL RASH Items with * indicate a potential emergency and should be followed up as soon as possible.  Feel free to call the clinic you have any questions or concerns. The clinic phone number is [phone].  Please show the CHEMO ALERT CARD at check-in to the Emergency Department and triage nurse.   

## 2017-01-16 ENCOUNTER — Other Ambulatory Visit: Payer: Self-pay | Admitting: *Deleted

## 2017-01-16 MED ORDER — POMALIDOMIDE 1 MG PO CAPS: 1.0000 mg | ORAL_CAPSULE | Freq: Every day | ORAL | 0 refills | Status: DC

## 2017-01-24 ENCOUNTER — Ambulatory Visit (HOSPITAL_BASED_OUTPATIENT_CLINIC_OR_DEPARTMENT_OTHER): Payer: Commercial Managed Care - PPO

## 2017-01-24 ENCOUNTER — Other Ambulatory Visit (HOSPITAL_BASED_OUTPATIENT_CLINIC_OR_DEPARTMENT_OTHER): Payer: Commercial Managed Care - PPO

## 2017-01-24 VITALS — BP 122/71 | HR 57 | Temp 98.9°F | Resp 18

## 2017-01-24 DIAGNOSIS — Z5112 Encounter for antineoplastic immunotherapy: Secondary | ICD-10-CM | POA: Diagnosis not present

## 2017-01-24 DIAGNOSIS — C9000 Multiple myeloma not having achieved remission: Secondary | ICD-10-CM | POA: Diagnosis not present

## 2017-01-24 DIAGNOSIS — Z9481 Bone marrow transplant status: Secondary | ICD-10-CM

## 2017-01-24 LAB — COMPREHENSIVE METABOLIC PANEL
ALBUMIN: 3.6 g/dL (ref 3.5–5.0)
ALK PHOS: 98 U/L (ref 40–150)
ALT: 37 U/L (ref 0–55)
ANION GAP: 8 meq/L (ref 3–11)
AST: 34 U/L (ref 5–34)
BILIRUBIN TOTAL: 0.45 mg/dL (ref 0.20–1.20)
BUN: 20.7 mg/dL (ref 7.0–26.0)
CALCIUM: 10.3 mg/dL (ref 8.4–10.4)
CO2: 24 meq/L (ref 22–29)
CREATININE: 1.1 mg/dL (ref 0.7–1.3)
Chloride: 104 mEq/L (ref 98–109)
EGFR: 69 mL/min/{1.73_m2} — AB (ref >90)
Glucose: 89 mg/dl (ref 70–140)
Potassium: 4.7 mEq/L (ref 3.5–5.1)
Sodium: 136 mEq/L (ref 136–145)
TOTAL PROTEIN: 6.8 g/dL (ref 6.4–8.3)

## 2017-01-24 LAB — CBC WITH DIFFERENTIAL/PLATELET
BASO%: 1.8 % (ref 0.0–2.0)
Basophils Absolute: 0.1 10*3/uL (ref 0.0–0.1)
EOS ABS: 0.2 10*3/uL (ref 0.0–0.5)
EOS%: 4.7 % (ref 0.0–7.0)
HEMATOCRIT: 37.5 % — AB (ref 38.4–49.9)
HEMOGLOBIN: 13 g/dL (ref 13.0–17.1)
LYMPH#: 1.2 10*3/uL (ref 0.9–3.3)
LYMPH%: 26.8 % (ref 14.0–49.0)
MCH: 35.8 pg — ABNORMAL HIGH (ref 27.2–33.4)
MCHC: 34.6 g/dL (ref 32.0–36.0)
MCV: 103.3 fL — AB (ref 79.3–98.0)
MONO#: 0.6 10*3/uL (ref 0.1–0.9)
MONO%: 13.6 % (ref 0.0–14.0)
NEUT%: 53.1 % (ref 39.0–75.0)
NEUTROS ABS: 2.4 10*3/uL (ref 1.5–6.5)
PLATELETS: 146 10*3/uL (ref 140–400)
RBC: 3.63 10*6/uL — ABNORMAL LOW (ref 4.20–5.82)
RDW: 20.6 % — ABNORMAL HIGH (ref 11.0–14.6)
WBC: 4.4 10*3/uL (ref 4.0–10.3)

## 2017-01-24 MED ORDER — BORTEZOMIB CHEMO SQ INJECTION 3.5 MG (2.5MG/ML)
1.3000 mg/m2 | Freq: Once | INTRAMUSCULAR | Status: AC
  Administered 2017-01-24: 2.5 mg via SUBCUTANEOUS
  Filled 2017-01-24: qty 2.5

## 2017-01-24 MED ORDER — PROCHLORPERAZINE MALEATE 10 MG PO TABS: 10.0000 mg | ORAL_TABLET | Freq: Once | ORAL | Status: DC

## 2017-01-24 NOTE — Patient Instructions (Signed)
[facility] Discharge Instructions for Patients Receiving Chemotherapy  Today you received the following chemotherapy agents Velcade  To help prevent nausea and vomiting after your treatment, we encourage you to take your nausea medication    If you develop nausea and vomiting that is not controlled by your nausea medication, call the clinic.   BELOW ARE SYMPTOMS THAT SHOULD BE REPORTED IMMEDIATELY:  *FEVER GREATER THAN 100.5 F  *CHILLS WITH OR WITHOUT FEVER  NAUSEA AND VOMITING THAT IS NOT CONTROLLED WITH YOUR NAUSEA MEDICATION  *UNUSUAL SHORTNESS OF BREATH  *UNUSUAL BRUISING OR BLEEDING  TENDERNESS IN MOUTH AND THROAT WITH OR WITHOUT PRESENCE OF ULCERS  *URINARY PROBLEMS  *BOWEL PROBLEMS  UNUSUAL RASH Items with * indicate a potential emergency and should be followed up as soon as possible.  Feel free to call the clinic you have any questions or concerns. The clinic phone number is [phone].  Please show the CHEMO ALERT CARD at check-in to the Emergency Department and triage nurse.   

## 2017-01-29 LAB — IMMUNOFIXATION ELECTROPHORESIS
IGA/IMMUNOGLOBULIN A, SERUM: 168 mg/dL (ref 61–437)
IgM, Qn, Serum: 94 mg/dL (ref 20–172)
Total Protein: 6.2 g/dL (ref 6.0–8.5)

## 2017-01-30 ENCOUNTER — Other Ambulatory Visit: Payer: Self-pay

## 2017-01-30 ENCOUNTER — Encounter: Payer: Self-pay | Admitting: Adult Health

## 2017-01-30 DIAGNOSIS — C9000 Multiple myeloma not having achieved remission: Secondary | ICD-10-CM

## 2017-02-05 ENCOUNTER — Other Ambulatory Visit: Payer: Self-pay | Admitting: Oncology

## 2017-02-05 ENCOUNTER — Other Ambulatory Visit: Payer: Self-pay | Admitting: Internal Medicine

## 2017-02-06 ENCOUNTER — Ambulatory Visit (HOSPITAL_BASED_OUTPATIENT_CLINIC_OR_DEPARTMENT_OTHER): Payer: Commercial Managed Care - PPO

## 2017-02-06 ENCOUNTER — Telehealth: Payer: Self-pay | Admitting: *Deleted

## 2017-02-06 ENCOUNTER — Other Ambulatory Visit (HOSPITAL_BASED_OUTPATIENT_CLINIC_OR_DEPARTMENT_OTHER): Payer: Commercial Managed Care - PPO

## 2017-02-06 VITALS — BP 100/72 | HR 84 | Temp 97.8°F | Resp 16

## 2017-02-06 DIAGNOSIS — C9000 Multiple myeloma not having achieved remission: Secondary | ICD-10-CM | POA: Diagnosis not present

## 2017-02-06 DIAGNOSIS — Z5112 Encounter for antineoplastic immunotherapy: Secondary | ICD-10-CM | POA: Diagnosis not present

## 2017-02-06 DIAGNOSIS — Z9481 Bone marrow transplant status: Secondary | ICD-10-CM

## 2017-02-06 LAB — CBC WITH DIFFERENTIAL/PLATELET
BASO%: 0.8 % (ref 0.0–2.0)
Basophils Absolute: 0 10*3/uL (ref 0.0–0.1)
EOS%: 7.4 % — AB (ref 0.0–7.0)
Eosinophils Absolute: 0.4 10*3/uL (ref 0.0–0.5)
HCT: 39.1 % (ref 38.4–49.9)
HGB: 13.5 g/dL (ref 13.0–17.1)
LYMPH%: 24.4 % (ref 14.0–49.0)
MCH: 35.6 pg — ABNORMAL HIGH (ref 27.2–33.4)
MCHC: 34.5 g/dL (ref 32.0–36.0)
MCV: 103.2 fL — ABNORMAL HIGH (ref 79.3–98.0)
MONO#: 1.2 10*3/uL — AB (ref 0.1–0.9)
MONO%: 25.6 % — ABNORMAL HIGH (ref 0.0–14.0)
NEUT%: 41.8 % (ref 39.0–75.0)
NEUTROS ABS: 2 10*3/uL (ref 1.5–6.5)
PLATELETS: 107 10*3/uL — AB (ref 140–400)
RBC: 3.79 10*6/uL — ABNORMAL LOW (ref 4.20–5.82)
RDW: 17.4 % — ABNORMAL HIGH (ref 11.0–14.6)
WBC: 4.8 10*3/uL (ref 4.0–10.3)
lymph#: 1.2 10*3/uL (ref 0.9–3.3)

## 2017-02-06 LAB — COMPREHENSIVE METABOLIC PANEL
ALT: 32 U/L (ref 0–55)
ANION GAP: 12 meq/L — AB (ref 3–11)
AST: 29 U/L (ref 5–34)
Albumin: 3.8 g/dL (ref 3.5–5.0)
Alkaline Phosphatase: 93 U/L (ref 40–150)
BILIRUBIN TOTAL: 0.39 mg/dL (ref 0.20–1.20)
BUN: 17.6 mg/dL (ref 7.0–26.0)
CO2: 22 meq/L (ref 22–29)
CREATININE: 1.1 mg/dL (ref 0.7–1.3)
Calcium: 9.7 mg/dL (ref 8.4–10.4)
Chloride: 108 mEq/L (ref 98–109)
EGFR: 70 mL/min/{1.73_m2} — ABNORMAL LOW (ref >90)
GLUCOSE: 86 mg/dL (ref 70–140)
Potassium: 4.1 mEq/L (ref 3.5–5.1)
SODIUM: 142 meq/L (ref 136–145)
TOTAL PROTEIN: 7.3 g/dL (ref 6.4–8.3)

## 2017-02-06 MED ORDER — PROCHLORPERAZINE MALEATE 10 MG PO TABS: 10.0000 mg | ORAL_TABLET | Freq: Once | ORAL | Status: DC

## 2017-02-06 MED ORDER — PROCHLORPERAZINE MALEATE 10 MG PO TABS
ORAL_TABLET | ORAL | Status: AC
  Filled 2017-02-06: qty 1

## 2017-02-06 MED ORDER — BORTEZOMIB CHEMO SQ INJECTION 3.5 MG (2.5MG/ML)
1.3000 mg/m2 | Freq: Once | INTRAMUSCULAR | Status: AC
  Administered 2017-02-06: 2.5 mg via SUBCUTANEOUS
  Filled 2017-02-06: qty 2.5

## 2017-02-06 NOTE — Telephone Encounter (Signed)
"  I need to reschedule my appointments on June 6th to June 7th."  Offered to send message to schedulers or patient can stop by scheduling when he arrives today.  Andre [name] will stop by scheduling while here today.  Message left for collaborative as an FYI.

## 2017-02-06 NOTE — Patient Instructions (Signed)
[facility] Discharge Instructions for Patients Receiving Chemotherapy  Today you received the following chemotherapy agents Velcade. To help prevent nausea and vomiting after your treatment, we encourage you to take your nausea medication as directed.  If you develop nausea and vomiting that is not controlled by your nausea medication, call the clinic.   BELOW ARE SYMPTOMS THAT SHOULD BE REPORTED IMMEDIATELY:  *FEVER GREATER THAN 100.5 F  *CHILLS WITH OR WITHOUT FEVER  NAUSEA AND VOMITING THAT IS NOT CONTROLLED WITH YOUR NAUSEA MEDICATION  *UNUSUAL SHORTNESS OF BREATH  *UNUSUAL BRUISING OR BLEEDING  TENDERNESS IN MOUTH AND THROAT WITH OR WITHOUT PRESENCE OF ULCERS  *URINARY PROBLEMS  *BOWEL PROBLEMS  UNUSUAL RASH Items with * indicate a potential emergency and should be followed up as soon as possible.  Feel free to call the clinic you have any questions or concerns. The clinic phone number is [phone].  Please show the CHEMO ALERT CARD at check-in to the Emergency Department and triage nurse.    

## 2017-02-07 ENCOUNTER — Ambulatory Visit: Payer: Commercial Managed Care - PPO | Admitting: Oncology

## 2017-02-07 ENCOUNTER — Other Ambulatory Visit: Payer: Commercial Managed Care - PPO

## 2017-02-07 ENCOUNTER — Ambulatory Visit: Payer: Commercial Managed Care - PPO

## 2017-02-07 LAB — FERRITIN: Ferritin: 130 ng/ml (ref 22–316)

## 2017-02-07 LAB — IRON AND TIBC
%SAT: 32 % (ref 20–55)
Iron: 111 ug/dL (ref 42–163)
TIBC: 344 ug/dL (ref 202–409)
UIBC: 233 ug/dL (ref 117–376)

## 2017-02-21 ENCOUNTER — Ambulatory Visit: Payer: Commercial Managed Care - PPO | Admitting: Oncology

## 2017-02-21 ENCOUNTER — Other Ambulatory Visit: Payer: Commercial Managed Care - PPO

## 2017-02-21 ENCOUNTER — Ambulatory Visit: Payer: Commercial Managed Care - PPO

## 2017-02-21 NOTE — Progress Notes (Signed)
ID: Andre [name]   DOB: [birth year]  MR#: [id number]  NWG#:[id number]   PCP: Andre [name] SU: OTHER MD: Andre [name], Andre [name], [address] [place] [phone]), Andre [name]  CHIEF COMPLAINT: Multiple myeloma  CURRENT TREATMENT: Maintenance therapy with bortezomib, zolendronate, and pomalidomide  INTERVAL HISTORY:  Andre [name] returns today for follow-up of his multiple myeloma. He continues on every 2 week bortezomib. He tolerates this well although it makes him feel a little bit tired for about one day. He has not had problems with progressive peripheral neuropathy.  He also takes pomalidomide 1 mg daily, 21 days on, 7 daysoff. He is currently in the off week. He usually feels a little bit better on the off week although he still does not have a lot of energy she says. He thinks the pomalidomide affects his bowels unpredictably. Of course he has always had problems in this regard. Sometimes he is a little constipated and sometimes a little bit loose. He treats this symptomatically  Finally he also receives zolendronate every 3 months,with the dose scheduled for today.he says that makes him feel like the flu for 2 or 3 days but does not have any other issues related to it and in particular no dental issues.  REVIEW OF SYSTEMS: Aside from treatment related problems, Andre [name] is doing well. He is working full time. He tries to walk 1-1/2-2 miles most days. He describes his fatigue as moderate. He has some heartburn. He is on medication for that. He feels forgetful. He thinks his depression is well-controlled. A detailed review of systems today was otherwise stable.  HISTORY OF PRESENT ILLNESS:  From the original intake nodes:  The patient was worked up for peptic ulcer disease in August of 2011, with significant bleeding and anemia.  The patient was Andre [name] negative.  He received some epinephrine when he had his EGD and then started on Protonix.  The  patient's anemia slowly resolved so that by September, his hemoglobin was up to 10.9 and by earlier this month, his hemoglobin was up to 12.5.    As part of his general workup, he was found to have a slightly elevated globulin fraction.  In September, his total protein was 8.3 with an albumin of 3.8.  In January, the total protein was 8.4 and albumin 3.6.  With persistence of this slight abnormality, Andre [name] obtained serum immunofixation and SPEP.  The SPTP showed an M-spike of 2.67 grams.  A total IgG was 4,190.  Total IgA low at 28.  Total IgM low at 28 also.  The immunofixation showed a monoclonal IgG lambda paraprotein.  There were also monoclonal free lambda light chains present. With this information, the patient was referred for further evaluation.   A diagnosis of myeloma was confirmed by bone marrow biopsy and subsequebnt treatment is as detailed below  PAST MEDICAL HISTORY: Past Medical History:  Diagnosis Date  . Allergy   . Anemia   . Arthritis   . Asthma    no treatment x 20 years  . Depression   . Double vision    occurs at times   . Duodenal ulcer   . GERD (gastroesophageal reflux disease)   . Hyperlipidemia   . Hypertension   . Hypothyroidism   . Multiple myeloma 07/04/2011  . Thyroid disease   Significant for peptic ulcer disease as noted above.  History of hyperlipidemia.  History of anxiety and depression.  History of GERD which is significantly improved with weight loss  and history of reactive airway disease, possibly secondary to the GERD which also improved with weight loss.  There was a history of obesity, now much improved secondary to exercise and diet.   PAST SURGICAL HISTORY: Past Surgical History:  Procedure Laterality Date  . BONE MARROW TRANSPLANT  2011   for MM  . CARDIOLITE STUDY  11/25/2003   NORMAL  . TONSILLECTOMY      FAMILY HISTORY Family History  Problem Relation Age of Onset  . Throat cancer Mother   . Hypertension Father   . Stroke  Father   . Asthma Father   . Diabetes Father   . Pancreatic cancer Brother   [name] father died at the age of 8 following a stroke.  [name] mother died with cancer of the throat at the age of 37.   She had a history of depression and was a smoker.  Not clear how much alcohol she drank, according to the patient.  The patient had two brothers.  One died from the age of 3 from a "kidney problem."  The second one died at the age of 44 from pancreatic cancer. (This may have been duodenal cancer. 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  His wife of 37+ years was Andre [name], but they are now divorced. In [name] he remarried, his current wife's name is Andre [name].  The patient has two daughters:  Andre [name], who works in [place], who is expecting her second child Andre [name]. .  They both live here in [place]. 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  The patient is very close to his daughters.  Andre [name] first daughter, Andre [name], born 03/06/2013, apparently has Weber/Osler/Rendu. The patient is not a church attender.  He does derive a great deal of support from friends at the "[employer]" which he attends very regularly, and also participates in Al-Anon even though actually there is no alcohol or drug problem in the family.  He participates in this group and he gets quite a bit of support from it.     ADVANCED DIRECTIVES: In place  HEALTH MAINTENANCE: (updated 07/16/2013) Social History  Substance Use Topics  . Smoking status: Former Smoker    Years: 3.00    Quit date: 03/27/1969  . Smokeless tobacco: Never Used  . Alcohol use No     Colonoscopy:  PSA:  Sept 2014, normal/Dr. [name]  Lipid panel: Sept 2014/Dr. [name]   Allergies  Allergen Reactions  . Atorvastatin Other  (See Comments)  . Rosuvastatin     Other reaction(s): Liver Disorder  . Crestor [Rosuvastatin Calcium]     ADVERSE EFFECTS ON LIVER  . Lipitor [Atorvastatin Calcium]   . Septra [Sulfamethoxazole-Trimethoprim] Rash    Current Outpatient Prescriptions  Medication Sig Dispense Refill  . aspirin 325 MG tablet Take 325 mg by mouth daily.    . cetirizine (ZYRTEC) 10 MG tablet Take 10 mg by mouth daily.    [name] docusate sodium (COLACE) 100 MG capsule Take 100 mg by mouth daily as needed for mild constipation or moderate constipation.    [name] escitalopram (LEXAPRO) 10 MG tablet take 1 tablet by mouth once daily 30 tablet 1  . feeding supplement, ENSURE ENLIVE, (ENSURE ENLIVE) LIQD Take 237 mLs by mouth 2 (two) times daily between  meals. 237 mL 12  . levothyroxine (SYNTHROID, LEVOTHROID) 150 MCG tablet take 1 tablet by mouth every morning ON AN EMPTY STOMACH 90 tablet 1  . pantoprazole (PROTONIX) 40 MG tablet take 1 tablet by mouth twice a day before meals 60 tablet 3  . pomalidomide (POMALYST) 1 MG capsule Take 1 capsule (1 mg total) by mouth daily. Take with water on days 1-21. Repeat every 28 days. 21 capsule 0  . pomalidomide (POMALYST) 1 MG capsule Take 1 capsule (1 mg total) by mouth daily. Take with water on days 1-21. Repeat every 28 days. 21 capsule 0  . ranitidine (ZANTAC) 150 MG tablet Take 150 mg by mouth 2 (two) times daily.    . sildenafil (VIAGRA) 25 MG tablet Take 1 tablet (25 mg total) by mouth daily as needed for erectile dysfunction. 10 tablet 0  . valACYclovir (VALTREX) 1000 MG tablet Take 1 tablet (1,000 mg total) by mouth daily. (Patient taking differently: Take 500 mg by mouth daily. ) 60 tablet 6  . zolendronic acid (ZOMETA) 4 MG/5ML injection Inject 4 mg into the vein every 3 (three) months.    . zolpidem (AMBIEN) 5 MG tablet Take 1-2 tablets (5-10 mg total) by mouth at bedtime as needed for sleep. 30 tablet 1   No current facility-administered medications for this visit.     Facility-Administered Medications Ordered in Other Visits  Medication Dose Route Frequency Provider Last Rate Last Dose  . 0.9 %  sodium chloride infusion   Intravenous Once Chauncey Cruel, MD        Objective: Middle aged white man iwho appears stated age  44:   02/22/17 0936  BP: 127/74  Pulse: (!) 55  Resp: 18  Temp: 97.6 F (36.4 C)  Body mass index is 27.75 kg/m. Filed Weights   02/22/17 0936  Weight: 187 lb 14.4 oz (85.2 kg)    Filed Weights   08/15/16 0954  Weight: 187 lb 8 oz (85 kg)   Sclerae unicteric, EOMs intact Oropharynx clear and moist No cervical or supraclavicular adenopathy Lungs no rales or rhonchi Heart regular rate and rhythm Abd soft, nontender, positive bowel sounds MSK no focal spinal tenderness, no upper extremity lymphedema Neuro: nonfocal, well oriented, appropriate affect   No visits with results within 1 Day(s) from this visit.  Latest known visit with results is:  Appointment on 02/06/2017  Component Date Value Ref Range Status  . WBC 02/06/2017 4.8  4.0 - 10.3 10e3/uL Final  . NEUT# 02/06/2017 2.0  1.5 - 6.5 10e3/uL Final  . HGB 02/06/2017 13.5  13.0 - 17.1 g/dL Final  . HCT 02/06/2017 39.1  38.4 - 49.9 % Final  . Platelets 02/06/2017 107* 140 - 400 10e3/uL Final  . MCV 02/06/2017 103.2* 79.3 - 98.0 fL Final  . MCH 02/06/2017 35.6* 27.2 - 33.4 pg Final  . MCHC 02/06/2017 34.5  32.0 - 36.0 g/dL Final  . RBC 02/06/2017 3.79* 4.20 - 5.82 10e6/uL Final  . RDW 02/06/2017 17.4* 11.0 - 14.6 % Final  . lymph# 02/06/2017 1.2  0.9 - 3.3 10e3/uL Final  . MONO# 02/06/2017 1.2* 0.1 - 0.9 10e3/uL Final  . Eosinophils Absolute 02/06/2017 0.4  0.0 - 0.5 10e3/uL Final  . Basophils Absolute 02/06/2017 0.0  0.0 - 0.1 10e3/uL Final  . NEUT% 02/06/2017 41.8  39.0 - 75.0 % Final  . LYMPH% 02/06/2017 24.4  14.0 - 49.0 % Final  . MONO% 02/06/2017 25.6* 0.0 - 14.0 % Final  .  EOS% 02/06/2017 7.4* 0.0 - 7.0 % Final  . BASO% 02/06/2017 0.8  0.0 -  2.0 % Final  . Sodium 02/06/2017 142  136 - 145 mEq/L Final  . Potassium 02/06/2017 4.1  3.5 - 5.1 mEq/L Final  . Chloride 02/06/2017 108  98 - 109 mEq/L Final  . CO2 02/06/2017 22  22 - 29 mEq/L Final  . Glucose 02/06/2017 86  70 - 140 mg/dl Final   Glucose reference range is for nonfasting patients. Fasting glucose reference range is 70- 100.  [name] BUN 02/06/2017 17.6  7.0 - 26.0 mg/dL Final  . Creatinine 02/06/2017 1.1  0.7 - 1.3 mg/dL Final  . Total Bilirubin 02/06/2017 0.39  0.20 - 1.20 mg/dL Final  . Alkaline Phosphatase 02/06/2017 93  40 - 150 U/L Final  . AST 02/06/2017 29  5 - 34 U/L Final  . ALT 02/06/2017 32  0 - 55 U/L Final  . Total Protein 02/06/2017 7.3  6.4 - 8.3 g/dL Final  . Albumin 02/06/2017 3.8  3.5 - 5.0 g/dL Final  . Calcium 02/06/2017 9.7  8.4 - 10.4 mg/dL Final  . Anion Gap 02/06/2017 12* 3 - 11 mEq/L Final  . EGFR 02/06/2017 70* >90 ml/min/1.73 m2 Final   eGFR is calculated using the CKD-EPI Creatinine Equation (2009)  . Iron 02/06/2017 111  42 - 163 ug/dL Final  . TIBC 02/06/2017 344  202 - 409 ug/dL Final  . UIBC 02/06/2017 233  117 - 376 ug/dL Final  . %SAT 02/06/2017 32  20 - 55 % Final  . Ferritin 02/06/2017 130  22 - 316 ng/ml Final   Ferritin 02/06/2017 was 130.  Immunofixation 05/09/[name] showed an IgM IgM lambda monoclonal spike. The total IgG was 1.044, total IgA 1.68 and total IgM 0.94, all of these in the normal range.  STUDIES: No new results to review  ASSESSMENT: 66 y.o.  [place] man with a history of multiple myeloma diagnosed February of 2012, with an initial M-spike of 2.6 g/dL, IFE showing an IgG lambda paraprotein and free lambda light chains in the urine. Cytogenetics showed trisomy 40. Bone marrow biopsy showed a 22% plasmacytosis. Treated with   (1) bortezomib (subcutaneously), lenalidomide, and dexamethasone, with repeat bone marrow biopsy May of 2012 showing 10% plasmacytosis   (2) high-dose chemotherapy with BCNU and melphalan at  [facility], followed by stem cell rescue July of 2012   (3) on zoledronic acid started December of 2012, initially monthly, currently given every 3 months, most recent dose  12/07/2015  (4) low-dose lenalidomide resumed Andre 2013, interrupted several times.  Resumed again on 02/19/2013, ata dose of 5 mg daily, 21 days on and 7 days off, later further reduced to 7 days on, 7 days off  (5) CNS symptoms and abnormal brain MRI extensively evaluated by neurology with no definitive diagnosis established  (6) rising M spike noted June 2014 but did not meet criteria for carfilzomib study  (7) on lenalidomide 25 mg daily, 14 days on, 7 days off, starting 04/18/2013, interrupted December 2014 because of rash;   (a) resumed January 2015 at 10 mg/ day at 21 days on/ 7 days off  (Andre [name]) starting 08/18/2014 decreased to 10 mg/ day 14 days on, 7 days off because of cytopenias  (c) as of February [name] was on 5 mg daily 7 days on 7 days off  (d) lenalidomide discontinued December [name] with evidence of disease progression  (8) transient global amnesia 09/10/[name], resolved without intervention  (9) starting  PVD 10/25/2015 w ASA 325 thromboprophylaxis, valacyclovir prophylaxis, last dose 12/17/2015  (a) pomalidomide 4 mg/d days 1-14  (Andre [name]) bortezomib sQ days 2,5,9,12 of each 21 day cycle  (c) dexamethasome 20 mg two days a week  (10) metapneumovirus pneumonia Andre 2017  (a) completing course of steroids and week of bactrim mid Andre 2017  (11) status post second autologous transplant at [facility] 02/04/2016(preparatory regimen melphalan 200 mg/m)  (12) maintenance therapy started November 2017, consisting of  (a) bortezomib 1.3 mg/M2 QOW, first dose 07/27/2016  (Andre [name]) pomalidomide 1 mg days 1-21 Q28 days (increase to 2 mg as tolerated) started 07/19/2016  (c) zolendronate monthly started 07/27/2016 (previously Q 3 months) however patient unablet to tolerate, and changed to q3  month in Andre, 2018 (Due in June,  2018)  PLAN:  Andre [name] is tolerating his treatment well and we are proceeding with bortezomib and zolendronate today. He will resume his pomalidomide next week.  He wanted to change the July 5 treatment to the following week because [place] and because his grandson is, have surgical repair of cryptorchidism. No problem with that.  He is already scheduled to meet with Dr. Bernita Buffy 03/14/2017 and they will be obtaining labs here 2 weeks from today for her review.  He is going to see me again late July. He knows to call for any problems that may develop before his next visit here.   Chauncey Cruel 02/22/2017  Medical Oncology and Hematology [facility] [address] [place], [place] [address] Tel. [phone]    Fax. [phone]

## 2017-02-22 ENCOUNTER — Other Ambulatory Visit (HOSPITAL_BASED_OUTPATIENT_CLINIC_OR_DEPARTMENT_OTHER): Payer: Commercial Managed Care - PPO

## 2017-02-22 ENCOUNTER — Ambulatory Visit (HOSPITAL_BASED_OUTPATIENT_CLINIC_OR_DEPARTMENT_OTHER): Payer: Commercial Managed Care - PPO | Admitting: Oncology

## 2017-02-22 ENCOUNTER — Ambulatory Visit (HOSPITAL_BASED_OUTPATIENT_CLINIC_OR_DEPARTMENT_OTHER): Payer: Commercial Managed Care - PPO

## 2017-02-22 VITALS — BP 127/74 | HR 55 | Temp 97.6°F | Resp 18 | Ht 69.0 in | Wt 187.9 lb

## 2017-02-22 DIAGNOSIS — Z9481 Bone marrow transplant status: Secondary | ICD-10-CM

## 2017-02-22 DIAGNOSIS — C9000 Multiple myeloma not having achieved remission: Secondary | ICD-10-CM

## 2017-02-22 DIAGNOSIS — D701 Agranulocytosis secondary to cancer chemotherapy: Secondary | ICD-10-CM | POA: Insufficient documentation

## 2017-02-22 DIAGNOSIS — T451X5A Adverse effect of antineoplastic and immunosuppressive drugs, initial encounter: Secondary | ICD-10-CM

## 2017-02-22 DIAGNOSIS — R5383 Other fatigue: Secondary | ICD-10-CM | POA: Diagnosis not present

## 2017-02-22 DIAGNOSIS — Z5112 Encounter for antineoplastic immunotherapy: Secondary | ICD-10-CM | POA: Diagnosis not present

## 2017-02-22 DIAGNOSIS — F329 Major depressive disorder, single episode, unspecified: Secondary | ICD-10-CM | POA: Diagnosis not present

## 2017-02-22 HISTORY — DX: Adverse effect of antineoplastic and immunosuppressive drugs, initial encounter: D70.1

## 2017-02-22 LAB — CBC WITH DIFFERENTIAL/PLATELET
BASO%: 1.2 % (ref 0.0–2.0)
BASOS ABS: 0.1 10*3/uL (ref 0.0–0.1)
EOS%: 5.6 % (ref 0.0–7.0)
Eosinophils Absolute: 0.3 10*3/uL (ref 0.0–0.5)
HCT: 39.2 % (ref 38.4–49.9)
HEMOGLOBIN: 13.5 g/dL (ref 13.0–17.1)
LYMPH%: 34.2 % (ref 14.0–49.0)
MCH: 36.5 pg — AB (ref 27.2–33.4)
MCHC: 34.4 g/dL (ref 32.0–36.0)
MCV: 105.9 fL — AB (ref 79.3–98.0)
MONO#: 1.2 10*3/uL — ABNORMAL HIGH (ref 0.1–0.9)
MONO%: 23.8 % — AB (ref 0.0–14.0)
NEUT#: 1.8 10*3/uL (ref 1.5–6.5)
NEUT%: 35.2 % — AB (ref 39.0–75.0)
Platelets: 116 10*3/uL — ABNORMAL LOW (ref 140–400)
RBC: 3.7 10*6/uL — ABNORMAL LOW (ref 4.20–5.82)
RDW: 15.5 % — AB (ref 11.0–14.6)
WBC: 5 10*3/uL (ref 4.0–10.3)
lymph#: 1.7 10*3/uL (ref 0.9–3.3)

## 2017-02-22 LAB — COMPREHENSIVE METABOLIC PANEL
ALBUMIN: 3.6 g/dL (ref 3.5–5.0)
ALK PHOS: 84 U/L (ref 40–150)
ALT: 30 U/L (ref 0–55)
AST: 25 U/L (ref 5–34)
Anion Gap: 9 mEq/L (ref 3–11)
BUN: 19.3 mg/dL (ref 7.0–26.0)
CALCIUM: 9.7 mg/dL (ref 8.4–10.4)
CHLORIDE: 105 meq/L (ref 98–109)
CO2: 24 mEq/L (ref 22–29)
Creatinine: 1 mg/dL (ref 0.7–1.3)
EGFR: 74 mL/min/{1.73_m2} — AB (ref >90)
GLUCOSE: 69 mg/dL — AB (ref 70–140)
POTASSIUM: 4.5 meq/L (ref 3.5–5.1)
SODIUM: 139 meq/L (ref 136–145)
Total Bilirubin: 0.38 mg/dL (ref 0.20–1.20)
Total Protein: 7.3 g/dL (ref 6.4–8.3)

## 2017-02-22 MED ORDER — PROCHLORPERAZINE MALEATE 10 MG PO TABS: 10.0000 mg | ORAL_TABLET | Freq: Once | ORAL | Status: DC

## 2017-02-22 MED ORDER — BORTEZOMIB CHEMO SQ INJECTION 3.5 MG (2.5MG/ML)
1.3000 mg/m2 | Freq: Once | INTRAMUSCULAR | Status: AC
  Administered 2017-02-22: 2.5 mg via SUBCUTANEOUS
  Filled 2017-02-22: qty 2.5

## 2017-02-22 NOTE — Patient Instructions (Signed)

## 2017-03-06 ENCOUNTER — Other Ambulatory Visit: Payer: Self-pay

## 2017-03-06 DIAGNOSIS — C9000 Multiple myeloma not having achieved remission: Secondary | ICD-10-CM

## 2017-03-07 ENCOUNTER — Other Ambulatory Visit (HOSPITAL_BASED_OUTPATIENT_CLINIC_OR_DEPARTMENT_OTHER): Payer: Commercial Managed Care - PPO

## 2017-03-07 ENCOUNTER — Ambulatory Visit (HOSPITAL_BASED_OUTPATIENT_CLINIC_OR_DEPARTMENT_OTHER): Payer: Commercial Managed Care - PPO

## 2017-03-07 VITALS — BP 138/69 | HR 57 | Temp 98.2°F | Resp 16

## 2017-03-07 DIAGNOSIS — C9000 Multiple myeloma not having achieved remission: Secondary | ICD-10-CM

## 2017-03-07 DIAGNOSIS — Z9481 Bone marrow transplant status: Secondary | ICD-10-CM

## 2017-03-07 DIAGNOSIS — Z5112 Encounter for antineoplastic immunotherapy: Secondary | ICD-10-CM | POA: Diagnosis not present

## 2017-03-07 DIAGNOSIS — C9002 Multiple myeloma in relapse: Secondary | ICD-10-CM

## 2017-03-07 LAB — CBC WITH DIFFERENTIAL/PLATELET
BASO%: 0.6 % (ref 0.0–2.0)
Basophils Absolute: 0 10*3/uL (ref 0.0–0.1)
EOS%: 8.3 % — ABNORMAL HIGH (ref 0.0–7.0)
Eosinophils Absolute: 0.5 10*3/uL (ref 0.0–0.5)
HCT: 38.1 % — ABNORMAL LOW (ref 38.4–49.9)
HGB: 13.2 g/dL (ref 13.0–17.1)
LYMPH%: 24.1 % (ref 14.0–49.0)
MCH: 36.6 pg — ABNORMAL HIGH (ref 27.2–33.4)
MCHC: 34.6 g/dL (ref 32.0–36.0)
MCV: 105.5 fL — ABNORMAL HIGH (ref 79.3–98.0)
MONO#: 0.8 10*3/uL (ref 0.1–0.9)
MONO%: 15.4 % — AB (ref 0.0–14.0)
NEUT#: 2.8 10*3/uL (ref 1.5–6.5)
NEUT%: 51.6 % (ref 39.0–75.0)
Platelets: 113 10*3/uL — ABNORMAL LOW (ref 140–400)
RBC: 3.61 10*6/uL — AB (ref 4.20–5.82)
RDW: 14.3 % (ref 11.0–14.6)
WBC: 5.4 10*3/uL (ref 4.0–10.3)
lymph#: 1.3 10*3/uL (ref 0.9–3.3)

## 2017-03-07 LAB — COMPREHENSIVE METABOLIC PANEL
ALT: 36 U/L (ref 0–55)
ANION GAP: 10 meq/L (ref 3–11)
AST: 31 U/L (ref 5–34)
Albumin: 3.6 g/dL (ref 3.5–5.0)
Alkaline Phosphatase: 93 U/L (ref 40–150)
BUN: 17.9 mg/dL (ref 7.0–26.0)
CHLORIDE: 103 meq/L (ref 98–109)
CO2: 22 meq/L (ref 22–29)
CREATININE: 1.1 mg/dL (ref 0.7–1.3)
Calcium: 9.4 mg/dL (ref 8.4–10.4)
EGFR: 70 mL/min/{1.73_m2} — ABNORMAL LOW (ref >90)
Glucose: 78 mg/dl (ref 70–140)
POTASSIUM: 4.4 meq/L (ref 3.5–5.1)
Sodium: 135 mEq/L — ABNORMAL LOW (ref 136–145)
Total Bilirubin: 0.51 mg/dL (ref 0.20–1.20)
Total Protein: 7.1 g/dL (ref 6.4–8.3)

## 2017-03-07 MED ORDER — ZOLEDRONIC ACID 4 MG/100ML IV SOLN
4.0000 mg | Freq: Once | INTRAVENOUS | Status: AC
  Administered 2017-03-07: 4 mg via INTRAVENOUS
  Filled 2017-03-07: qty 100

## 2017-03-07 MED ORDER — PROCHLORPERAZINE MALEATE 10 MG PO TABS: 10.0000 mg | ORAL_TABLET | Freq: Once | ORAL | Status: DC

## 2017-03-07 MED ORDER — PROCHLORPERAZINE MALEATE 10 MG PO TABS
ORAL_TABLET | ORAL | Status: AC
  Filled 2017-03-07: qty 1

## 2017-03-07 MED ORDER — BORTEZOMIB CHEMO SQ INJECTION 3.5 MG (2.5MG/ML)
1.3000 mg/m2 | Freq: Once | INTRAMUSCULAR | Status: AC
Start: 2017-03-07 — End: 2017-03-07
  Administered 2017-03-07: 2.5 mg via SUBCUTANEOUS
  Filled 2017-03-07: qty 2.5

## 2017-03-07 NOTE — Patient Instructions (Signed)
[facility] Discharge Instructions for Patients Receiving Chemotherapy  Today you received the following chemotherapy agents: Velcade   To help prevent nausea and vomiting after your treatment, we encourage you to take your nausea medication as directed.    If you develop nausea and vomiting that is not controlled by your nausea medication, call the clinic.   BELOW ARE SYMPTOMS THAT SHOULD BE REPORTED IMMEDIATELY:  *FEVER GREATER THAN 100.5 F  *CHILLS WITH OR WITHOUT FEVER  NAUSEA AND VOMITING THAT IS NOT CONTROLLED WITH YOUR NAUSEA MEDICATION  *UNUSUAL SHORTNESS OF BREATH  *UNUSUAL BRUISING OR BLEEDING  TENDERNESS IN MOUTH AND THROAT WITH OR WITHOUT PRESENCE OF ULCERS  *URINARY PROBLEMS  *BOWEL PROBLEMS  UNUSUAL RASH Items with * indicate a potential emergency and should be followed up as soon as possible.  Feel free to call the clinic you have any questions or concerns. The clinic phone number is [phone].  Please show the CHEMO ALERT CARD at check-in to the Emergency Department and triage nurse.    Zoledronic Acid injection (Hypercalcemia, Oncology) What is this medicine? ZOLEDRONIC ACID (ZOE le dron ik AS id) lowers the amount of calcium loss from bone. It is used to treat too much calcium in your blood from cancer. It is also used to prevent complications of cancer that has spread to the bone. This medicine may be used for other purposes; ask your health care provider or pharmacist if you have questions. COMMON BRAND NAME(S): Zometa What should I tell my health care provider before I take this medicine? They need to know if you have any of these conditions: -aspirin-sensitive asthma -cancer, especially if you are receiving medicines used to treat cancer -dental disease or wear dentures -infection -kidney disease -receiving corticosteroids like dexamethasone or prednisone -an unusual or allergic reaction to zoledronic acid, other medicines,  foods, dyes, or preservatives -pregnant or trying to get pregnant -breast-feeding How should I use this medicine? This medicine is for infusion into a vein. It is given by a health care professional in a hospital or clinic setting. Talk to your pediatrician regarding the use of this medicine in children. Special care may be needed. Overdosage: If you think you have taken too much of this medicine contact a poison control center or emergency room at once. NOTE: This medicine is only for you. Do not share this medicine with others. What if I miss a dose? It is important not to miss your dose. Call your doctor or health care professional if you are unable to keep an appointment. What may interact with this medicine? -certain antibiotics given by injection -NSAIDs, medicines for pain and inflammation, like ibuprofen or naproxen -some diuretics like bumetanide, furosemide -teriparatide -thalidomide This list may not describe all possible interactions. Give your health care provider a list of all the medicines, herbs, non-prescription drugs, or dietary supplements you use. Also tell them if you smoke, drink alcohol, or use illegal drugs. Some items may interact with your medicine. What should I watch for while using this medicine? Visit your doctor or health care professional for regular checkups. It may be some time before you see the benefit from this medicine. Do not stop taking your medicine unless your doctor tells you to. Your doctor may order blood tests or other tests to see how you are doing. Women should inform their doctor if they wish to become pregnant or think they might be pregnant. There is a potential for serious side effects to an unborn   child. Talk to your health care professional or pharmacist for more information. You should make sure that you get enough calcium and vitamin D while you are taking this medicine. Discuss the foods you eat and the vitamins you take with your health  care professional. Some people who take this medicine have severe bone, joint, and/or muscle pain. This medicine may also increase your risk for jaw problems or a broken thigh bone. Tell your doctor right away if you have severe pain in your jaw, bones, joints, or muscles. Tell your doctor if you have any pain that does not go away or that gets worse. Tell your dentist and dental surgeon that you are taking this medicine. You should not have major dental surgery while on this medicine. See your dentist to have a dental exam and fix any dental problems before starting this medicine. Take good care of your teeth while on this medicine. Make sure you see your dentist for regular follow-up appointments. What side effects may I notice from receiving this medicine? Side effects that you should report to your doctor or health care professional as soon as possible: -allergic reactions like skin rash, itching or hives, swelling of the face, lips, or tongue -anxiety, confusion, or depression -breathing problems -changes in vision -eye pain -feeling faint or lightheaded, falls -jaw pain, especially after dental work -mouth sores -muscle cramps, stiffness, or weakness -redness, blistering, peeling or loosening of the skin, including inside the mouth -trouble passing urine or change in the amount of urine Side effects that usually do not require medical attention (report to your doctor or health care professional if they continue or are bothersome): -bone, joint, or muscle pain -constipation -diarrhea -fever -hair loss -irritation at site where injected -loss of appetite -nausea, vomiting -stomach upset -trouble sleeping -trouble swallowing -weak or tired This list may not describe all possible side effects. Call your doctor for medical advice about side effects. You may report side effects to FDA at 1-800-FDA-1088. Where should I keep my medicine? This drug is given in a hospital or clinic and  will not be stored at home. NOTE: This sheet is a summary. It may not cover all possible information. If you have questions about this medicine, talk to your doctor, pharmacist, or health care provider.  2018 Elsevier/Gold Standard (2014-01-31 14:19:39)  

## 2017-03-09 LAB — IMMUNOFIXATION ELECTROPHORESIS
IgA, Qn, Serum: 185 mg/dL (ref 61–437)
IgG, Qn, Serum: 1116 mg/dL (ref 700–1600)
IgM, Qn, Serum: 51 mg/dL (ref 20–172)
Total Protein: 6.7 g/dL (ref 6.0–8.5)

## 2017-03-20 ENCOUNTER — Telehealth: Payer: Self-pay | Admitting: *Deleted

## 2017-03-20 NOTE — Telephone Encounter (Signed)
Patient called to make [name], NP aware that he would not be at his appointment 03/26/17. He stated "saw Dr. Alvie Heidelberg from [facility], she changed the frequency of medication- Valcade." Patient confirmed his appointment on 04/05/17. Georgia Dom, NP aware patient was cancelling appointments for 03/26/17.

## 2017-03-22 ENCOUNTER — Encounter: Payer: Self-pay | Admitting: *Deleted

## 2017-03-26 ENCOUNTER — Other Ambulatory Visit: Payer: Commercial Managed Care - PPO

## 2017-03-26 ENCOUNTER — Ambulatory Visit: Payer: Commercial Managed Care - PPO

## 2017-04-04 ENCOUNTER — Other Ambulatory Visit: Payer: Self-pay | Admitting: Emergency Medicine

## 2017-04-04 DIAGNOSIS — C9000 Multiple myeloma not having achieved remission: Secondary | ICD-10-CM

## 2017-04-05 ENCOUNTER — Encounter: Payer: Self-pay | Admitting: Oncology

## 2017-04-05 ENCOUNTER — Ambulatory Visit (HOSPITAL_BASED_OUTPATIENT_CLINIC_OR_DEPARTMENT_OTHER): Payer: Commercial Managed Care - PPO | Admitting: Oncology

## 2017-04-05 ENCOUNTER — Other Ambulatory Visit (HOSPITAL_BASED_OUTPATIENT_CLINIC_OR_DEPARTMENT_OTHER): Payer: Commercial Managed Care - PPO

## 2017-04-05 ENCOUNTER — Ambulatory Visit (HOSPITAL_BASED_OUTPATIENT_CLINIC_OR_DEPARTMENT_OTHER): Payer: Commercial Managed Care - PPO

## 2017-04-05 VITALS — BP 134/72 | HR 54 | Temp 97.5°F | Resp 18 | Ht 69.0 in | Wt 189.8 lb

## 2017-04-05 DIAGNOSIS — C9000 Multiple myeloma not having achieved remission: Secondary | ICD-10-CM

## 2017-04-05 DIAGNOSIS — E039 Hypothyroidism, unspecified: Secondary | ICD-10-CM | POA: Diagnosis not present

## 2017-04-05 DIAGNOSIS — Z5112 Encounter for antineoplastic immunotherapy: Secondary | ICD-10-CM | POA: Diagnosis not present

## 2017-04-05 DIAGNOSIS — Z9481 Bone marrow transplant status: Secondary | ICD-10-CM

## 2017-04-05 LAB — CBC WITH DIFFERENTIAL/PLATELET
BASO%: 1.2 % (ref 0.0–2.0)
Basophils Absolute: 0.1 10*3/uL (ref 0.0–0.1)
EOS%: 6.5 % (ref 0.0–7.0)
Eosinophils Absolute: 0.3 10*3/uL (ref 0.0–0.5)
HCT: 38.7 % (ref 38.4–49.9)
HGB: 13.3 g/dL (ref 13.0–17.1)
LYMPH#: 1 10*3/uL (ref 0.9–3.3)
LYMPH%: 23.4 % (ref 14.0–49.0)
MCH: 37.4 pg — ABNORMAL HIGH (ref 27.2–33.4)
MCHC: 34.4 g/dL (ref 32.0–36.0)
MCV: 108.7 fL — ABNORMAL HIGH (ref 79.3–98.0)
MONO#: 0.6 10*3/uL (ref 0.1–0.9)
MONO%: 14.6 % — AB (ref 0.0–14.0)
NEUT#: 2.3 10*3/uL (ref 1.5–6.5)
NEUT%: 54.3 % (ref 39.0–75.0)
Platelets: 110 10*3/uL — ABNORMAL LOW (ref 140–400)
RBC: 3.56 10*6/uL — AB (ref 4.20–5.82)
RDW: 13.8 % (ref 11.0–14.6)
WBC: 4.2 10*3/uL (ref 4.0–10.3)

## 2017-04-05 LAB — COMPREHENSIVE METABOLIC PANEL
ALT: 29 U/L (ref 0–55)
AST: 26 U/L (ref 5–34)
Albumin: 3.4 g/dL — ABNORMAL LOW (ref 3.5–5.0)
Alkaline Phosphatase: 77 U/L (ref 40–150)
Anion Gap: 9 mEq/L (ref 3–11)
BUN: 18.8 mg/dL (ref 7.0–26.0)
CHLORIDE: 106 meq/L (ref 98–109)
CO2: 21 meq/L — AB (ref 22–29)
CREATININE: 1 mg/dL (ref 0.7–1.3)
Calcium: 9.2 mg/dL (ref 8.4–10.4)
EGFR: 74 mL/min/{1.73_m2} — ABNORMAL LOW (ref >90)
Glucose: 75 mg/dl (ref 70–140)
Potassium: 4.6 mEq/L (ref 3.5–5.1)
SODIUM: 136 meq/L (ref 136–145)
Total Bilirubin: 0.5 mg/dL (ref 0.20–1.20)
Total Protein: 6.8 g/dL (ref 6.4–8.3)

## 2017-04-05 MED ORDER — DTAP-HEPATITIS B RECOMB-IPV IM SUSP: 0.5000 mL | Freq: Once | INTRAMUSCULAR | Status: DC

## 2017-04-05 MED ORDER — PROCHLORPERAZINE MALEATE 10 MG PO TABS
ORAL_TABLET | ORAL | Status: AC
  Filled 2017-04-05: qty 1

## 2017-04-05 MED ORDER — PNEUMOCOCCAL 13-VAL CONJ VACC IM SUSP: 0.5000 mL | INTRAMUSCULAR | Status: DC

## 2017-04-05 MED ORDER — HAEMOPHILUS B POLYSAC CONJ VAC 7.5 MCG/0.5 ML IM SUSP: 0.5000 mL | Freq: Once | INTRAMUSCULAR | Status: DC

## 2017-04-05 MED ORDER — BORTEZOMIB CHEMO SQ INJECTION 3.5 MG (2.5MG/ML)
1.3000 mg/m2 | Freq: Once | INTRAMUSCULAR | Status: AC
  Administered 2017-04-05: 2.5 mg via SUBCUTANEOUS
  Filled 2017-04-05: qty 2.5

## 2017-04-05 MED ORDER — PROCHLORPERAZINE MALEATE 10 MG PO TABS
10.0000 mg | ORAL_TABLET | Freq: Once | ORAL | Status: AC
Start: 2017-04-05 — End: 2017-04-05
  Administered 2017-04-05: 10 mg via ORAL

## 2017-04-05 MED ORDER — HEPATITIS B VAC RECOMBINANT 10 MCG/0.5ML IJ SUSP: 0.5000 mL | Freq: Once | INTRAMUSCULAR | Status: DC

## 2017-04-05 NOTE — Progress Notes (Signed)
ID: Andre [name]   DOB: 09-11-1951  MR#: [id number]  IRC#:[id number]   PCP: Andre [name] SU: OTHER MD: Andre [name], Andre [name], [address] [place] [phone]), Andre [name]  CHIEF COMPLAINT: Multiple myeloma  CURRENT TREATMENT: Maintenance therapy with bortezomib, zolendronate, and pomalidomide  INTERVAL HISTORY:  Andre [name] returns today for follow-up and treatment of his multiple myeloma. He continues on bortezomib, which we have been doing every 2 weeks. After his visit at [facility] however Dr. Alvie [name] suggested going to monthly and that is what we have done--his last dose was 4 weeks ago.  He continues on pomalidomide. He has only been able to tolerate 1 mg daily, 21 days on 7 days off. He is now in the middle of the second week of the current cycle. He has some variable constipation/diarrhea, fatigue, and borderline low count issues from the medication. He is currently pending $100 a month out of pocket for this.  He also receives zolendronate every 3 weeks, with his most recent dose 03/07/2017  A summary of the 03/14/2017 note Andre [name] is as follows: 1. Andre [name] labs: SPEP: M-spike: 0.19; FLC: kappa: 3.46, lambda: 2.13, ratio: 1.62; Andre [name]: Andre [name] Kappa and lambda light chain.  2. Andre [name] Heavy Chain assasy: sent to [facility] clinic: pending 3. Continue Andre [name] 500 twice daily.  4. Recommend maintenance Velcade 1.51m/m2 monthly, pomalidomide 26mD1-D21 based on tolerability.  5. Recommend Zometa q 62m64m 6. Pneumo-vax at 6 mths 7. 12 mth vaccinations administered today.  8. 14 mth vaccinations to be administered at home.  9. RTC in 12 mths or sooner if needed; 24 mth vaccinations to be administered at this time    REVIEW OF SYSTEMS: Gen. overall feels "well". He is working full time. He is walking for exercise. [name]. He has diarrhea alternating with constipation which  is not a new problem for him. He feels forgetful. There has been no pain, rash, fever, unexplained weight loss, or other worrisome symptoms. A detailed review of systems today was stable  HISTORY OF PRESENT ILLNESS:  From the original intake nodes:  The patient was worked up for peptic ulcer disease in August of 2011, with significant bleeding and anemia.  The patient was Andre [name] negative.  He received some epinephrine when he had his EGD and then started on Protonix.  [name] anemia slowly resolved so that by September, his hemoglobin was up to 10.9 and by earlier this month, his hemoglobin was up to 12.5.    As part of his general workup, he was found to have a slightly elevated globulin fraction.  In September, his total protein was 8.3 with an albumin of 3.8.  In January, the total protein was 8.4 and albumin 3.6.  With persistence of this slight abnormality, Dr. BroOlevia Perchestained serum immunofixation and SPEP.  The SPTP showed an M-spike of 2.67 grams.  A total Andre [name] was 4,190.  Total IgA low at 28.  Total IgM low at 28 also.  The immunofixation showed a monoclonal Andre [name] lambda paraprotein.  There were also monoclonal free lambda light chains present. With this information, the patient was referred for further evaluation.   A diagnosis of myeloma was confirmed by bone marrow biopsy and subsequebnt treatment is as detailed below  PAST MEDICAL HISTORY: Past Medical History:  Diagnosis Date  . Allergy   . Anemia   . Arthritis   . Asthma  no treatment x 20 years  . Depression   . Double vision    occurs at times   . Duodenal ulcer   . GERD (gastroesophageal reflux disease)   . Hyperlipidemia   . Hypertension   . Hypothyroidism   . Multiple myeloma 07/04/2011  . Thyroid disease   Significant for peptic ulcer disease as noted above.  History of hyperlipidemia.  History of anxiety and depression.  History of GERD which is significantly improved with weight loss and history of  reactive airway disease, possibly secondary to the GERD which also improved with weight loss.  There was a history of obesity, now much improved secondary to exercise and diet.   PAST SURGICAL HISTORY: Past Surgical History:  Procedure Laterality Date  . BONE MARROW TRANSPLANT  2011   for Andre [name]  . CARDIOLITE STUDY  11/25/2003   NORMAL  . TONSILLECTOMY      FAMILY HISTORY Family History  Problem Relation Age of Onset  . Throat cancer Mother   . Hypertension Father   . Stroke Father   . Asthma Father   . Diabetes Father   . Pancreatic cancer Brother   [name] father died at the age of 14 following a stroke.  [name] mother died with cancer of the throat at the age of 65.   She had a history of depression and was a smoker.  Not clear how much alcohol she drank, according to the patient.  The patient had two brothers.  One died from the age of 66 from a "kidney problem."  The second one died at the age of 89 from pancreatic cancer. (This may have been duodenal cancer. 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  His wife of 37+ years was Andre [name], but they are now In process of divorce, to be finalized August 2018. In 2016 he remarried, his current wife's name is Andre [name].  The patient has two daughters:  Andre [name], who works in [place], who is expecting her second child April 2016. .  They both live here in [place]. 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  The patient is very close to his daughters.  Andre [name] first daughter, Andre [name], born 03/06/2013, apparently has Weber/Osler/Rendu. The patient is not a church attender.  He does derive a great deal of support from friends at the "[employer]" which he attends very regularly, and also participates in Al-Anon even though actually  there is no alcohol or drug problem in the family.  He participates in this group and he gets quite a bit of support from it.     ADVANCED DIRECTIVES: In place  HEALTH MAINTENANCE: (updated 07/16/2013) Social History  Substance Use Topics  . Smoking status: Former Smoker    Years: 3.00    Quit date: 03/27/1969  . Smokeless tobacco: Never Used  . Alcohol use No     Colonoscopy:  PSA:  Sept 2014, normal/Dr. [name]  Lipid panel: Sept 2014/Dr. [name]   Allergies  Allergen Reactions  . Atorvastatin Other (See Comments)  . Rosuvastatin     Other reaction(s): Liver Disorder  . Crestor [Rosuvastatin Calcium]     ADVERSE EFFECTS ON LIVER  . Lipitor [Atorvastatin Calcium]   . Septra [Sulfamethoxazole-Trimethoprim] Rash    Current Outpatient Prescriptions  Medication  Sig Dispense Refill  . aspirin 325 MG tablet Take 325 mg by mouth daily.    . cetirizine (ZYRTEC) 10 MG tablet Take 10 mg by mouth daily.    [name] docusate sodium (COLACE) 100 MG capsule Take 100 mg by mouth daily as needed for mild constipation or moderate constipation.    [name] escitalopram (LEXAPRO) 10 MG tablet take 1 tablet by mouth once daily 30 tablet 1  . feeding supplement, ENSURE ENLIVE, (ENSURE ENLIVE) LIQD Take 237 mLs by mouth 2 (two) times daily between meals. 237 mL 12  . levothyroxine (SYNTHROID, LEVOTHROID) 150 MCG tablet take 1 tablet by mouth every morning ON AN EMPTY STOMACH 90 tablet 1  . pantoprazole (PROTONIX) 40 MG tablet take 1 tablet by mouth twice a day before meals 60 tablet 3  . pomalidomide (POMALYST) 1 MG capsule Take 1 capsule (1 mg total) by mouth daily. Take with water on days 1-21. Repeat every 28 days. 21 capsule 0  . pomalidomide (POMALYST) 1 MG capsule Take 1 capsule (1 mg total) by mouth daily. Take with water on days 1-21. Repeat every 28 days. 21 capsule 0  . ranitidine (ZANTAC) 150 MG tablet Take 150 mg by mouth 2 (two) times daily.    . sildenafil (VIAGRA) 25 MG tablet Take 1 tablet  (25 mg total) by mouth daily as needed for erectile dysfunction. 10 tablet 0  . valACYclovir (Andre [name]) 1000 MG tablet Take 1 tablet (1,000 mg total) by mouth daily. (Patient taking differently: Take 500 mg by mouth daily. ) 60 tablet 6  . zolendronic acid (ZOMETA) 4 MG/5ML injection Inject 4 mg into the vein every 3 (three) months.    . zolpidem (AMBIEN) 5 MG tablet Take 1-2 tablets (5-10 mg total) by mouth at bedtime as needed for sleep. 30 tablet 1   Objective: Middle aged white man In no acute distress  Vitals:   04/05/17 0840  BP: 134/72  Pulse: (!) 54  Resp: 18  Temp: (!) 97.5 F (36.4 C)  Body mass index is 28.03 kg/m. Filed Weights   04/05/17 0840  Weight: 189 lb 12.8 oz (86.1 kg)   Sclerae unicteric, pupils round and equal Oropharynx clear and moist No cervical or supraclavicular adenopathy Lungs no rales or rhonchi Heart regular rate and rhythm Abd soft, nontender, positive bowel sounds MSK no focal spinal tenderness, no upper extremity lymphedema Neuro: nonfocal, well oriented, appropriate affect    Appointment on 04/05/2017  Component Date Value Ref Range Status  . WBC 04/05/2017 4.2  4.0 - 10.3 10e3/uL Final  . NEUT# 04/05/2017 2.3  1.5 - 6.5 10e3/uL Final  . HGB 04/05/2017 13.3  13.0 - 17.1 g/dL Final  . HCT 04/05/2017 38.7  38.4 - 49.9 % Final  . Platelets 04/05/2017 110* 140 - 400 10e3/uL Final  . MCV 04/05/2017 108.7* 79.3 - 98.0 fL Final  . MCH 04/05/2017 37.4* 27.2 - 33.4 pg Final  . MCHC 04/05/2017 34.4  32.0 - 36.0 g/dL Final  . RBC 04/05/2017 3.56* 4.20 - 5.82 10e6/uL Final  . RDW 04/05/2017 13.8  11.0 - 14.6 % Final  . lymph# 04/05/2017 1.0  0.9 - 3.3 10e3/uL Final  . MONO# 04/05/2017 0.6  0.1 - 0.9 10e3/uL Final  . Eosinophils Absolute 04/05/2017 0.3  0.0 - 0.5 10e3/uL Final  . Basophils Absolute 04/05/2017 0.1  0.0 - 0.1 10e3/uL Final  . NEUT% 04/05/2017 54.3  39.0 - 75.0 % Final  . LYMPH% 04/05/2017 23.4  14.0 - 49.0 %  Final  . MONO%  04/05/2017 14.6* 0.0 - 14.0 % Final  . EOS% 04/05/2017 6.5  0.0 - 7.0 % Final  . BASO% 04/05/2017 1.2  0.0 - 2.0 % Final   Immunoglobulin studies at [facility] showed the Lambda ratio to be 1.74, total Andre [name] 1260, total IgA 208, and the IgM 49. Andre [name] heavy chain assay is pending  STUDIES: No new results to review  ASSESSMENT: 66 y.o.  [place] man with a history of multiple myeloma diagnosed February of 2012, with an initial M-spike of 2.6 g/dL, Andre [name] showing an Andre [name] lambda paraprotein and free lambda light chains in the urine. Cytogenetics showed trisomy 2. Bone marrow biopsy showed a 22% plasmacytosis. Treated with   (1) bortezomib (subcutaneously), lenalidomide, and dexamethasone, with repeat bone marrow biopsy May of 2012 showing 10% plasmacytosis   (2) high-dose chemotherapy with BCNU and melphalan at [facility], followed by stem cell rescue July of 2012   (3) on zoledronic acid started December of 2012, initially monthly, currently given every 3 months, most recent dose  12/07/2015  (4) low-dose lenalidomide resumed April 2013, interrupted several times.  Resumed again on 02/19/2013, ata dose of 5 mg daily, 21 days on and 7 days off, later further reduced to 7 days on, 7 days off  (5) CNS symptoms and abnormal brain MRI extensively evaluated by neurology with no definitive diagnosis established  (6) rising M spike noted June 2014 but did not meet criteria for carfilzomib study  (7) on lenalidomide 25 mg daily, 14 days on, 7 days off, starting 04/18/2013, interrupted December 2014 because of rash;   (a) resumed January 2015 at 10 mg/ day at 21 days on/ 7 days off  ([place]) starting 08/18/2014 decreased to 10 mg/ day 14 days on, 7 days off because of cytopenias  (c) as of February 2016 was on 5 mg daily 7 days on 7 days off  (d) lenalidomide discontinued December 2016 with evidence of disease progression  (8) transient global amnesia 05/29/2015, resolved without intervention  (9) starting PVD 10/25/2015  w ASA 325 thromboprophylaxis, valacyclovir prophylaxis, last dose 12/17/2015  (a) pomalidomide 4 mg/d days 1-14  (Dail) bortezomib sQ days 2,5,9,12 of each 21 day cycle  (c) dexamethasome 20 mg two days a week  (10) metapneumovirus pneumonia April 2017  (a) completing course of steroids and week of bactrim mid April 2017  (11) status post second autologous transplant at [facility] 02/04/2016(preparatory regimen melphalan 200 mg/m)  (a) received twelve-month vaccinations 03/14/2017 (DPT, Haemophilus, Pneumovax 13, polio)  (Koal) 14 months injections to include TDap or DTaP, Hib conjugate, HepB energex Berkley 20 mcg/ml, Prevnar 13  (c) 24 month vaccines due at [facility] June 2019  (12) maintenance therapy started November 2017, consisting of  (a) bortezomib 1.3 mg/M2 QOW, first dose 07/27/2016  (Khayree) pomalidomide 1 mg days 1-21 Q28 days, started 07/19/2016  (c) zolendronate monthly started 07/27/2016 (previously Q 3 months) however patient unable to tolerate, and changed back to q3 month in April, 2018   (d) Bortezomib changed to monthly as of June 2018 because of tolerance issues  PLAN:  Andre [name] is very stable, although not yet in remission. We are backing off the every 2 week bortezomib to monthly Andre [name] just as we backed off from the monthly his alendronate to every 3 months. He has also only been able to tolerate 1 mg of pomalidomide. He gets excessively fatigued if we tried a 2 mg.  Even though this regimen is not producing a remission, he is  very stable on it and there has been no obvious disease progression.  The plan accordingly is to continue, with close follow-up. He will be due to for his 14 month vaccinations in August and those will be done here. He will be due for his next zolendronate dose September 2018.  He is about to finalize is divorced. His attorney has requested a letter discussing his condition and I will dictate that today and sent to the patient for him to use at his discretion  He knows to  call for any other problems that may develop before his next visit.  Andre [name] 04/05/2017  Medical Oncology and Hematology [facility] [address] [place], [place] [address] Tel. [phone]    Fax. [phone]

## 2017-04-05 NOTE — Patient Instructions (Signed)
[facility] Discharge Instructions for Patients Receiving Chemotherapy  Today you received the following chemotherapy agents: Velcade   To help prevent nausea and vomiting after your treatment, we encourage you to take your nausea medication as directed.    If you develop nausea and vomiting that is not controlled by your nausea medication, call the clinic.   BELOW ARE SYMPTOMS THAT SHOULD BE REPORTED IMMEDIATELY:  *FEVER GREATER THAN 100.5 F  *CHILLS WITH OR WITHOUT FEVER  NAUSEA AND VOMITING THAT IS NOT CONTROLLED WITH YOUR NAUSEA MEDICATION  *UNUSUAL SHORTNESS OF BREATH  *UNUSUAL BRUISING OR BLEEDING  TENDERNESS IN MOUTH AND THROAT WITH OR WITHOUT PRESENCE OF ULCERS  *URINARY PROBLEMS  *BOWEL PROBLEMS  UNUSUAL RASH Items with * indicate a potential emergency and should be followed up as soon as possible.  Feel free to call the clinic you have any questions or concerns. The clinic phone number is [phone].  Please show the CHEMO ALERT CARD at check-in to the Emergency Department and triage nurse.    Zoledronic Acid injection (Hypercalcemia, Oncology) What is this medicine? ZOLEDRONIC ACID (ZOE le dron ik AS id) lowers the amount of calcium loss from bone. It is used to treat too much calcium in your blood from cancer. It is also used to prevent complications of cancer that has spread to the bone. This medicine may be used for other purposes; ask your health care provider or pharmacist if you have questions. COMMON BRAND NAME(S): Zometa What should I tell my health care provider before I take this medicine? They need to know if you have any of these conditions: -aspirin-sensitive asthma -cancer, especially if you are receiving medicines used to treat cancer -dental disease or wear dentures -infection -kidney disease -receiving corticosteroids like dexamethasone or prednisone -an unusual or allergic reaction to zoledronic acid, other medicines,  foods, dyes, or preservatives -pregnant or trying to get pregnant -breast-feeding How should I use this medicine? This medicine is for infusion into a vein. It is given by a health care professional in a hospital or clinic setting. Talk to your pediatrician regarding the use of this medicine in children. Special care may be needed. Overdosage: If you think you have taken too much of this medicine contact a poison control center or emergency room at once. NOTE: This medicine is only for you. Do not share this medicine with others. What if I miss a dose? It is important not to miss your dose. Call your doctor or health care professional if you are unable to keep an appointment. What may interact with this medicine? -certain antibiotics given by injection -NSAIDs, medicines for pain and inflammation, like ibuprofen or naproxen -some diuretics like bumetanide, furosemide -teriparatide -thalidomide This list may not describe all possible interactions. Give your health care provider a list of all the medicines, herbs, non-prescription drugs, or dietary supplements you use. Also tell them if you smoke, drink alcohol, or use illegal drugs. Some items may interact with your medicine. What should I watch for while using this medicine? Visit your doctor or health care professional for regular checkups. It may be some time before you see the benefit from this medicine. Do not stop taking your medicine unless your doctor tells you to. Your doctor may order blood tests or other tests to see how you are doing. Women should inform their doctor if they wish to become pregnant or think they might be pregnant. There is a potential for serious side effects to an unborn   child. Talk to your health care professional or pharmacist for more information. You should make sure that you get enough calcium and vitamin D while you are taking this medicine. Discuss the foods you eat and the vitamins you take with your health  care professional. Some people who take this medicine have severe bone, joint, and/or muscle pain. This medicine may also increase your risk for jaw problems or a broken thigh bone. Tell your doctor right away if you have severe pain in your jaw, bones, joints, or muscles. Tell your doctor if you have any pain that does not go away or that gets worse. Tell your dentist and dental surgeon that you are taking this medicine. You should not have major dental surgery while on this medicine. See your dentist to have a dental exam and fix any dental problems before starting this medicine. Take good care of your teeth while on this medicine. Make sure you see your dentist for regular follow-up appointments. What side effects may I notice from receiving this medicine? Side effects that you should report to your doctor or health care professional as soon as possible: -allergic reactions like skin rash, itching or hives, swelling of the face, lips, or tongue -anxiety, confusion, or depression -breathing problems -changes in vision -eye pain -feeling faint or lightheaded, falls -jaw pain, especially after dental work -mouth sores -muscle cramps, stiffness, or weakness -redness, blistering, peeling or loosening of the skin, including inside the mouth -trouble passing urine or change in the amount of urine Side effects that usually do not require medical attention (report to your doctor or health care professional if they continue or are bothersome): -bone, joint, or muscle pain -constipation -diarrhea -fever -hair loss -irritation at site where injected -loss of appetite -nausea, vomiting -stomach upset -trouble sleeping -trouble swallowing -weak or tired This list may not describe all possible side effects. Call your doctor for medical advice about side effects. You may report side effects to FDA at 1-800-FDA-1088. Where should I keep my medicine? This drug is given in a hospital or clinic and  will not be stored at home. NOTE: This sheet is a summary. It may not cover all possible information. If you have questions about this medicine, talk to your doctor, pharmacist, or health care provider.  2018 Elsevier/Gold Standard (2014-01-31 14:19:39)  

## 2017-04-05 NOTE — Addendum Note (Signed)
Addended by: Henreitta Leber E on: 04/05/2017 12:47 PM   Modules accepted: Orders

## 2017-04-09 LAB — IMMUNOFIXATION ELECTROPHORESIS
IgA, Qn, Serum: 178 mg/dL (ref 61–437)
IgG, Qn, Serum: 1106 mg/dL (ref 700–1600)
IgM, Qn, Serum: 42 mg/dL (ref 20–172)
Total Protein: 6.6 g/dL (ref 6.0–8.5)

## 2017-04-18 ENCOUNTER — Other Ambulatory Visit: Payer: Self-pay | Admitting: *Deleted

## 2017-04-18 MED ORDER — POMALIDOMIDE 1 MG PO CAPS: 1.0000 mg | ORAL_CAPSULE | Freq: Every day | ORAL | 0 refills | Status: DC

## 2017-04-19 ENCOUNTER — Other Ambulatory Visit: Payer: Commercial Managed Care - PPO

## 2017-04-19 ENCOUNTER — Ambulatory Visit: Payer: Commercial Managed Care - PPO

## 2017-05-03 ENCOUNTER — Other Ambulatory Visit: Payer: Self-pay | Admitting: *Deleted

## 2017-05-03 DIAGNOSIS — C9000 Multiple myeloma not having achieved remission: Secondary | ICD-10-CM

## 2017-05-03 NOTE — Progress Notes (Signed)
ID: SONYA PUCCI   DOB: 1951/01/02  MR#: [id number]  LKT#:[id number]   PCP: [name] SU: OTHER MD: [name], [name], [address] [place] [phone]), Ardine Bjork  CHIEF COMPLAINT: Multiple myeloma  CURRENT TREATMENT: Maintenance therapy with bortezomib, zolendronate, and pomalidomide  INTERVAL HISTORY:  [name] returns today for follow-up and treatment of his multiple myeloma. He continues on bortezomib and pomalidomide. However we have had to drop the bortezomib dose to every month instead of weekly, and the pomalidomide is also at the lowest dose, 1 mg daily.  He tolerates this moderately well. He does feel fatigued, and most days at work he works half to [place] days. He is able to take walks for exercise but [facility]. He feels tired. He occasionally has cramps. He is making an effort to hydrate himself.  This has been a hard week for him because he has had the final hearings on his very protracted divorce. He requested a letter documenting his current situation and I was glad to provide that for him.  His next zolendronate dose will be in December.  REVIEW OF SYSTEMS: [name] continues to have problems with constipation. This is chronic. Occasionally has ankle swelling. He feels a little depressed this week. A detailed review of systems was otherwise noncontributory  HISTORY OF PRESENT ILLNESS:  From the original intake nodes:  The patient was worked up for peptic ulcer disease in August of 2011, with significant bleeding and anemia.  The patient was Helicobacter pylori negative.  He received some epinephrine when he had his EGD and then started on Protonix.  [name] anemia slowly resolved so that by September, his hemoglobin was up to 10.9 and by earlier this month, his hemoglobin was up to 12.5.    As part of his general workup, he was found to have a slightly elevated globulin fraction.  In  September, his total protein was 8.3 with an albumin of 3.8.  In January, the total protein was 8.4 and albumin 3.6.  With persistence of this slight abnormality, Dr. Olevia Perches obtained serum immunofixation and SPEP.  The SPTP showed an M-spike of 2.67 grams.  A total IgG was 4,190.  Total IgA low at 28.  Total IgM low at 28 also.  The immunofixation showed a monoclonal IgG lambda paraprotein.  There were also monoclonal free lambda light chains present. With this information, the patient was referred for further evaluation.   A diagnosis of myeloma was confirmed by bone marrow biopsy and subsequebnt treatment is as detailed below  PAST MEDICAL HISTORY: Past Medical History:  Diagnosis Date  . Allergy   . Anemia   . Arthritis   . Asthma    no treatment x 20 years  . Depression   . Double vision    occurs at times   . Duodenal ulcer   . GERD (gastroesophageal reflux disease)   . Hyperlipidemia   . Hypertension   . Hypothyroidism   . Multiple myeloma 07/04/2011  . Thyroid disease   Significant for peptic ulcer disease as noted above.  History of hyperlipidemia.  History of anxiety and depression.  History of GERD which is significantly improved with weight loss and history of reactive airway disease, possibly secondary to the GERD which also improved with weight loss.  There was a history of obesity, now much improved secondary to exercise and diet.   PAST SURGICAL HISTORY: Past Surgical History:  Procedure Laterality Date  .  BONE MARROW TRANSPLANT  2011   for MM  . CARDIOLITE STUDY  11/25/2003   NORMAL  . TONSILLECTOMY      FAMILY HISTORY Family History  Problem Relation Age of Onset  . Throat cancer Mother   . Hypertension Father   . Stroke Father   . Asthma Father   . Diabetes Father   . Pancreatic cancer Brother   [name] father died at the age of 70 following a stroke.  [name] mother died with cancer of the throat at the age of 105.   She had a history of  depression and was a smoker.  Not clear how much alcohol she drank, according to the patient.  The patient had two brothers.  One died from the age of 81 from a "kidney problem."  The second one died at the age of 61 from pancreatic cancer. (This may have been duodenal cancer. 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  His wife of 37+ years was [name], but they are in process of divorce, to be finalized August 2018. In 2016 he remarried, his current wife's name is [name].  The patient has two daughters:  [name], who works in [place], who had her second child April 2016. Both daughters live here in [place]. The patient is very close to his daughters.  Diop first daughter, [name], born 03/06/2013, apparently has Weber/Osler/Rendu. The patient is not a church attender.  [name] also participates in [place] even though actually there is no alcohol or drug problem in the family.  He participates in this group and he gets quite a bit of support from it.     ADVANCED DIRECTIVES: In place  HEALTH MAINTENANCE: (updated 07/16/2013) Social History  Substance Use Topics  . Smoking status: Former Smoker    Years: 3.00    Quit date: 03/27/1969  . Smokeless tobacco: Never Used  . Alcohol use No     Colonoscopy:  PSA:  Sept 2014, normal/Dr. [name]  Lipid panel: Sept 2014/Dr. [name]   Allergies  Allergen Reactions  . Atorvastatin Other (See Comments)  . Rosuvastatin     Other reaction(s): Liver Disorder  . Crestor [Rosuvastatin Calcium]     ADVERSE EFFECTS ON LIVER  . Lipitor [Atorvastatin Calcium]   . Septra [Sulfamethoxazole-Trimethoprim] Rash    Current Outpatient Prescriptions  Medication Sig Dispense Refill  . aspirin 325 MG tablet Take 325 mg by mouth daily.    . cetirizine (ZYRTEC) 10 MG tablet Take 10 mg by  mouth daily.    [name] docusate sodium (COLACE) 100 MG capsule Take 100 mg by mouth daily as needed for mild constipation or moderate constipation.    [name] escitalopram (LEXAPRO) 10 MG tablet take 1 tablet by mouth once daily 30 tablet 1  . feeding supplement, ENSURE ENLIVE, (ENSURE ENLIVE) LIQD Take 237 mLs by mouth 2 (two) times daily between meals. 237 mL 12  . levothyroxine (SYNTHROID, LEVOTHROID) 150 MCG tablet take 1 tablet by mouth every morning ON AN EMPTY STOMACH 90 tablet 1  . pantoprazole (PROTONIX) 40 MG tablet take 1 tablet by mouth twice a day before meals 60 tablet 3  . pomalidomide (POMALYST) 1 MG capsule Take 1 capsule (1 mg total) by mouth daily. Take with water on days 1-21. Repeat every 28 days. 21 capsule 0  .  pomalidomide (POMALYST) 1 MG capsule Take 1 capsule (1 mg total) by mouth daily. Take with water on days 1-21. Repeat every 28 days. 21 capsule 0  . ranitidine (ZANTAC) 150 MG tablet Take 150 mg by mouth 2 (two) times daily.    . sildenafil (VIAGRA) 25 MG tablet Take 1 tablet (25 mg total) by mouth daily as needed for erectile dysfunction. 10 tablet 0  . valACYclovir (VALTREX) 1000 MG tablet Take 1 tablet (1,000 mg total) by mouth daily. (Patient taking differently: Take 500 mg by mouth daily. ) 60 tablet 6  . zolendronic acid (ZOMETA) 4 MG/5ML injection Inject 4 mg into the vein every 3 (three) months.    . zolpidem (AMBIEN) 5 MG tablet Take 1-2 tablets (5-10 mg total) by mouth at bedtime as needed for sleep. 30 tablet 1   Objective: Middle aged white man Who appears stated age  74:   05/04/17 0829  BP: 125/77  Pulse: (!) 53  Resp: 18  Temp: 97.7 F (36.5 C)  SpO2: 100%  Body mass index is 27.93 kg/m. Filed Weights   05/04/17 0829  Weight: 189 lb 1.6 oz (85.8 kg)   Sclerae unicteric, pupils round and equal Oropharynx clear and moist No cervical or supraclavicular adenopathy Lungs no rales or rhonchi Heart regular rate and rhythm Abd soft, nontender, positive  bowel sounds MSK no focal spinal tenderness, no upper extremity lymphedema Neuro: nonfocal, well oriented, appropriate affect  _0 (totalprotelp,albuminelp,a1gs,a2gs,bets,beta2ser,gams,mspike,spei)@  _1 (kpafrelgtchn,lambdaser,kaplambratio)@  Appointment on 05/04/2017  Component Date Value Ref Range Status  . WBC 05/04/2017 4.5  4.0 - 10.3 10e3/uL Final  . NEUT# 05/04/2017 2.0  1.5 - 6.5 10e3/uL Final  . HGB 05/04/2017 13.0  13.0 - 17.1 g/dL Final  . HCT 05/04/2017 37.8* 38.4 - 49.9 % Final  . Platelets 05/04/2017 134* 140 - 400 10e3/uL Final  . MCV 05/04/2017 108.3* 79.3 - 98.0 fL Final  . MCH 05/04/2017 37.4* 27.2 - 33.4 pg Final  . MCHC 05/04/2017 34.5  32.0 - 36.0 g/dL Final  . RBC 05/04/2017 3.49* 4.20 - 5.82 10e6/uL Final  . RDW 05/04/2017 14.3  11.0 - 14.6 % Final  . lymph# 05/04/2017 1.4  0.9 - 3.3 10e3/uL Final  . MONO# 05/04/2017 0.8  0.1 - 0.9 10e3/uL Final  . Eosinophils Absolute 05/04/2017 0.2  0.0 - 0.5 10e3/uL Final  . Basophils Absolute 05/04/2017 0.1  0.0 - 0.1 10e3/uL Final  . NEUT% 05/04/2017 44.5  39.0 - 75.0 % Final  . LYMPH% 05/04/2017 31.3  14.0 - 49.0 % Final  . MONO% 05/04/2017 17.4* 0.0 - 14.0 % Final  . EOS% 05/04/2017 5.5  0.0 - 7.0 % Final  . BASO% 05/04/2017 1.3  0.0 - 2.0 % Final   Immunoglobulin studies at [facility] showed the Lambda ratio to be 1.74, total IgG 1260, total IgA 208, and the IgM 49. IgG heavy chain assay is pending  STUDIES: Myeloma studies 04/05/2017 showed a total IgG of 09/18/2004 total IgA 178 total IgM 42, all in the normal range, with an IgG Monoclonal component not otherwise quantified  ASSESSMENT: 66 y.o.  [place] man with a history of multiple myeloma diagnosed February of 2012, with an initial M-spike of 2.6 g/dL, IFE showing an IgG lambda paraprotein and free lambda light chains in the urine. Cytogenetics showed trisomy 4. Bone marrow biopsy showed a 22% plasmacytosis. Treated with   (1) bortezomib  (subcutaneously), lenalidomide, and dexamethasone, with repeat bone marrow biopsy May of 2012 showing 10% plasmacytosis   (  2) high-dose chemotherapy with BCNU and melphalan at [facility], followed by stem cell rescue July of 2012   (3) on zoledronic acid started December of 2012, initially monthly, currently given every 3 months, most recent dose  12/07/2015  (4) low-dose lenalidomide resumed April 2013, interrupted several times.  Resumed again on 02/19/2013, ata dose of 5 mg daily, 21 days on and 7 days off, later further reduced to 7 days on, 7 days off  (5) CNS symptoms and abnormal brain MRI extensively evaluated by neurology with no definitive diagnosis established  (6) rising M spike noted June 2014 but did not meet criteria for carfilzomib study  (7) on lenalidomide 25 mg daily, 14 days on, 7 days off, starting 04/18/2013, interrupted December 2014 because of rash;   (a) resumed January 2015 at 10 mg/ day at 21 days on/ 7 days off  (Maalik) starting 08/18/2014 decreased to 10 mg/ day 14 days on, 7 days off because of cytopenias  (c) as of February 2016 was on 5 mg daily 7 days on 7 days off  (d) lenalidomide discontinued December 2016 with evidence of disease progression  (8) transient global amnesia 05/29/2015, resolved without intervention  (9) starting PVD 10/25/2015 w ASA 325 thromboprophylaxis, valacyclovir prophylaxis, last dose 12/17/2015  (a) pomalidomide 4 mg/d days 1-14  (Jhair) bortezomib sQ days 2,5,9,12 of each 21 day cycle  (c) dexamethasome 20 mg two days a week  (10) metapneumovirus pneumonia April 2017  (a) completing course of steroids and week of bactrim mid April 2017  (11) status post second autologous transplant at [facility] 02/04/2016(preparatory regimen melphalan 200 mg/m)  (a) received twelve-month vaccinations 03/14/2017 (DPT, Haemophilus, Pneumovax 13, polio)  (Reyansh) 14 months injections 05/04/2016 include DTaP, Hib conjugate, HepB energex [place] 20 mcg/ml, Prevnar 13  (c) 24  month vaccines due at [facility] June 2019  (12) maintenance therapy started November 2017, consisting of  (a) bortezomib 1.3 mg/M2 QOW, first dose 07/27/2016  (Chadric) pomalidomide 1 mg days 1-21 Q28 days, started 07/19/2016  (c) zolendronate monthly started 07/27/2016 (previously Q 3 months) however patient unable to tolerate, and changed back to q3 month in April, 2018   (d) Bortezomib changed to monthly as of June 2018 because of tolerance issues  PLAN:  [name] continues to tolerate his current treatment moderately well. Of course the concern is that we have had to increase the treatment interval and decrease the dose for bortezomib and pomalidomide respectively. We are hopeful this will not compromise effectiveness.  We are checking labs with each bortezomib treatment which is every 28 days. We are going to see him with every second dose. His next visit therefore will be October 4 and then he will see me again in the last week in November.  He will receive his 14 month vaccinations today.  I provided him with a letter that he may use for purposes of his legal concerns.  [name] no stool call for any problems that may develop before the next visit here.  Chauncey Cruel 05/04/2017  Medical Oncology and Hematology [facility] [address] [place], [place] [address] Tel. [phone]    Fax. [phone]  For reference, this is a summary of the 03/14/2017 note Dr.Gasparetto, who is helping as manage [name]'s case: 1. MM labs: SPEP: M-spike: 0.19; FLC: kappa: 3.46, lambda: 2.13, ratio: 1.62; IFE: IgG Kappa and lambda light chain.  2. IgG Heavy Chain assasy: sent to [facility] clinic: pending 3. Continue Valtrex 500 twice daily.  4. Recommend  maintenance Velcade 1.1m/m2 monthly, pomalidomide 252mD1-D21 based on tolerability.  5. Recommend Zometa q 17m717m 6. Pneumo-vax at 6 mths 7. 12 mth vaccinations administered today.  8. 14 mth vaccinations to be administered at home.  9. RTC in 12  mths or sooner if needed; 24 mth vaccinations to be administered at this time

## 2017-05-04 ENCOUNTER — Other Ambulatory Visit (HOSPITAL_BASED_OUTPATIENT_CLINIC_OR_DEPARTMENT_OTHER): Payer: Commercial Managed Care - PPO

## 2017-05-04 ENCOUNTER — Ambulatory Visit (HOSPITAL_BASED_OUTPATIENT_CLINIC_OR_DEPARTMENT_OTHER): Payer: Commercial Managed Care - PPO

## 2017-05-04 ENCOUNTER — Ambulatory Visit (HOSPITAL_BASED_OUTPATIENT_CLINIC_OR_DEPARTMENT_OTHER): Payer: Commercial Managed Care - PPO | Admitting: Oncology

## 2017-05-04 ENCOUNTER — Encounter: Payer: Self-pay | Admitting: Oncology

## 2017-05-04 VITALS — BP 125/77 | HR 53 | Temp 97.7°F | Resp 18 | Ht 69.0 in | Wt 189.1 lb

## 2017-05-04 DIAGNOSIS — C9000 Multiple myeloma not having achieved remission: Secondary | ICD-10-CM

## 2017-05-04 DIAGNOSIS — Z5112 Encounter for antineoplastic immunotherapy: Secondary | ICD-10-CM

## 2017-05-04 DIAGNOSIS — Z9481 Bone marrow transplant status: Secondary | ICD-10-CM

## 2017-05-04 DIAGNOSIS — R9089 Other abnormal findings on diagnostic imaging of central nervous system: Secondary | ICD-10-CM | POA: Diagnosis not present

## 2017-05-04 DIAGNOSIS — Z23 Encounter for immunization: Secondary | ICD-10-CM | POA: Diagnosis not present

## 2017-05-04 DIAGNOSIS — K5900 Constipation, unspecified: Secondary | ICD-10-CM

## 2017-05-04 LAB — CBC WITH DIFFERENTIAL/PLATELET
BASO%: 1.3 % (ref 0.0–2.0)
Basophils Absolute: 0.1 10*3/uL (ref 0.0–0.1)
EOS%: 5.5 % (ref 0.0–7.0)
Eosinophils Absolute: 0.2 10*3/uL (ref 0.0–0.5)
HEMATOCRIT: 37.8 % — AB (ref 38.4–49.9)
HGB: 13 g/dL (ref 13.0–17.1)
LYMPH#: 1.4 10*3/uL (ref 0.9–3.3)
LYMPH%: 31.3 % (ref 14.0–49.0)
MCH: 37.4 pg — ABNORMAL HIGH (ref 27.2–33.4)
MCHC: 34.5 g/dL (ref 32.0–36.0)
MCV: 108.3 fL — ABNORMAL HIGH (ref 79.3–98.0)
MONO#: 0.8 10*3/uL (ref 0.1–0.9)
MONO%: 17.4 % — AB (ref 0.0–14.0)
NEUT#: 2 10*3/uL (ref 1.5–6.5)
NEUT%: 44.5 % (ref 39.0–75.0)
Platelets: 134 10*3/uL — ABNORMAL LOW (ref 140–400)
RBC: 3.49 10*6/uL — ABNORMAL LOW (ref 4.20–5.82)
RDW: 14.3 % (ref 11.0–14.6)
WBC: 4.5 10*3/uL (ref 4.0–10.3)

## 2017-05-04 LAB — COMPREHENSIVE METABOLIC PANEL
ALK PHOS: 81 U/L (ref 40–150)
ALT: 26 U/L (ref 0–55)
AST: 25 U/L (ref 5–34)
Albumin: 3.4 g/dL — ABNORMAL LOW (ref 3.5–5.0)
Anion Gap: 8 mEq/L (ref 3–11)
BILIRUBIN TOTAL: 0.39 mg/dL (ref 0.20–1.20)
BUN: 20.3 mg/dL (ref 7.0–26.0)
CALCIUM: 9.6 mg/dL (ref 8.4–10.4)
CO2: 22 meq/L (ref 22–29)
Chloride: 105 mEq/L (ref 98–109)
Creatinine: 1.1 mg/dL (ref 0.7–1.3)
EGFR: 69 mL/min/{1.73_m2} — ABNORMAL LOW (ref >90)
GLUCOSE: 81 mg/dL (ref 70–140)
Potassium: 4.5 mEq/L (ref 3.5–5.1)
SODIUM: 135 meq/L — AB (ref 136–145)
Total Protein: 7 g/dL (ref 6.4–8.3)

## 2017-05-04 MED ORDER — PROCHLORPERAZINE MALEATE 10 MG PO TABS
10.0000 mg | ORAL_TABLET | Freq: Once | ORAL | Status: AC
  Administered 2017-05-04: 10 mg via ORAL

## 2017-05-04 MED ORDER — HAEMOPHILUS B POLYSAC CONJ VAC IM SOLR
0.5000 mL | Freq: Once | INTRAMUSCULAR | Status: AC
  Administered 2017-05-04: 0.5 mL via INTRAMUSCULAR
  Filled 2017-05-04: qty 0.5

## 2017-05-04 MED ORDER — PNEUMOCOCCAL 13-VAL CONJ VACC IM SUSP
0.5000 mL | Freq: Once | INTRAMUSCULAR | Status: AC
  Administered 2017-05-04: 0.5 mL via INTRAMUSCULAR
  Filled 2017-05-04: qty 0.5

## 2017-05-04 MED ORDER — BORTEZOMIB CHEMO SQ INJECTION 3.5 MG (2.5MG/ML)
1.3000 mg/m2 | Freq: Once | INTRAMUSCULAR | Status: AC
  Administered 2017-05-04: 2.5 mg via SUBCUTANEOUS
  Filled 2017-05-04: qty 2.5

## 2017-05-04 MED ORDER — PROCHLORPERAZINE MALEATE 10 MG PO TABS
ORAL_TABLET | ORAL | Status: AC
Start: 2017-05-04 — End: 2017-05-04
  Filled 2017-05-04: qty 1

## 2017-05-04 MED ORDER — DTAP-HEPATITIS B RECOMB-IPV IM SUSP
0.5000 mL | Freq: Once | INTRAMUSCULAR | Status: AC
  Administered 2017-05-04: 0.5 mL via INTRAMUSCULAR
  Filled 2017-05-04: qty 0.5

## 2017-05-04 NOTE — Patient Instructions (Addendum)
[place] Discharge Instructions for Patients Receiving Chemotherapy  Today you received the following chemotherapy agents:  Velcade.  To help prevent nausea and vomiting after your treatment, we encourage you to take your nausea medication as directed.   If you develop nausea and vomiting that is not controlled by your nausea medication, call the clinic.   BELOW ARE SYMPTOMS THAT SHOULD BE REPORTED IMMEDIATELY:  *FEVER GREATER THAN 100.5 F  *CHILLS WITH OR WITHOUT FEVER  NAUSEA AND VOMITING THAT IS NOT CONTROLLED WITH YOUR NAUSEA MEDICATION  *UNUSUAL SHORTNESS OF BREATH  *UNUSUAL BRUISING OR BLEEDING  TENDERNESS IN MOUTH AND THROAT WITH OR WITHOUT PRESENCE OF ULCERS  *URINARY PROBLEMS  *BOWEL PROBLEMS  UNUSUAL RASH Items with * indicate a potential emergency and should be followed up as soon as possible.  Feel free to call the clinic you have any questions or concerns. The clinic phone number is [phone].  Please show the [facility] at check-in to the Emergency Department and triage nurse.  Pneumococcal Conjugate Vaccine suspension for injection What is this medicine? PNEUMOCOCCAL VACCINE (NEU mo KOK al vak SEEN) is a vaccine used to prevent pneumococcus bacterial infections. These bacteria can cause serious infections like pneumonia, meningitis, and blood infections. This vaccine will lower your chance of getting pneumonia. If you do get pneumonia, it can make your symptoms milder and your illness shorter. This vaccine will not treat an infection and will not cause infection. This vaccine is recommended for infants and young children, adults with certain medical conditions, and adults 37 years or older. This medicine may be used for other purposes; ask your health care provider or pharmacist if you have questions. COMMON BRAND NAME(S): Prevnar, Prevnar 13 What should I tell my health care provider before I take this medicine? They need to know if  you have any of these conditions: -bleeding problems -fever -immune system problems -an unusual or allergic reaction to pneumococcal vaccine, diphtheria toxoid, other vaccines, latex, other medicines, foods, dyes, or preservatives -pregnant or trying to get pregnant -breast-feeding How should I use this medicine? This vaccine is for injection into a muscle. It is given by a health care professional. A copy of Vaccine Information Statements will be given before each vaccination. Read this sheet carefully each time. The sheet may change frequently. Talk to your pediatrician regarding the use of this medicine in children. While this drug may be prescribed for children as young as 49 weeks old for selected conditions, precautions do apply. Overdosage: If you think you have taken too much of this medicine contact a poison control center or emergency room at once. NOTE: This medicine is only for you. Do not share this medicine with others. What if I miss a dose? It is important not to miss your dose. Call your doctor or health care professional if you are unable to keep an appointment. What may interact with this medicine? -medicines for cancer chemotherapy -medicines that suppress your immune function -steroid medicines like prednisone or cortisone This list may not describe all possible interactions. Give your health care provider a list of all the medicines, herbs, non-prescription drugs, or dietary supplements you use. Also tell them if you smoke, drink alcohol, or use illegal drugs. Some items may interact with your medicine. What should I watch for while using this medicine? Mild fever and pain should go away in 3 days or less. Report any unusual symptoms to your doctor or health care professional. What side effects may  I notice from receiving this medicine? Side effects that you should report to your doctor or health care professional as soon as possible: -allergic reactions like skin rash,  itching or hives, swelling of the face, lips, or tongue -breathing problems -confused -fast or irregular heartbeat -fever over 102 degrees F -seizures -unusual bleeding or bruising -unusual muscle weakness Side effects that usually do not require medical attention (report to your doctor or health care professional if they continue or are bothersome): -aches and pains -diarrhea -fever of 102 degrees F or less -headache -irritable -loss of appetite -pain, tender at site where injected -trouble sleeping This list may not describe all possible side effects. Call your doctor for medical advice about side effects. You may report side effects to FDA at 1-800-FDA-1088. Where should I keep my medicine? This does not apply. This vaccine is given in a clinic, pharmacy, doctor's office, or other health care setting and will not be stored at home. NOTE: This sheet is a summary. It may not cover all possible information. If you have questions about this medicine, talk to your doctor, pharmacist, or health care provider.  2018 Elsevier/Gold Standard (2014-06-11 10:27:27) Haemophilus influenzae type Ladarien Conjugate Vaccine injection What is this medicine? HAEMOPHILUS INFLUENZAE TYPE Haygen CONJUGATE VACCINE (hem OFF fil [place] in floo En zuh type Kaito KAN ji get VAK seen) is used to prevent infections of a Haemophilus bacteria. This medicine may be used for other purposes; ask your health care provider or pharmacist if you have questions. COMMON BRAND NAME(S): ActHIB, Hiberix, HibTITER, PedvaxHIB What should I tell my health care provider before I take this medicine? They need to know if you have any of these conditions: -bleeding disorder -Guillain-Barre syndrome -immune system problems -infection with fever -low levels of platelets in the blood -take medicines that treat or prevent blood clots -an unusual or allergic reaction to vaccines, other medicines, foods, dyes, or preservatives -pregnant or trying to  get pregnant -breast-feeding How should I use this medicine? This vaccine is for injection into a muscle. It is given by a health care professional. A copy of Vaccine Information Statements will be given before each vaccination. Read this sheet carefully each time. The sheet may change frequently. Talk to your pediatrician regarding the use of this medicine in children. While this drug may be prescribed for children as young as 73 months old for selected conditions, precautions do apply. Overdosage: If you think you have taken too much of this medicine contact a poison control center or emergency room at once. NOTE: This medicine is only for you. Do not share this medicine with others. What if I miss a dose? Keep appointments for follow-up (booster) doses as directed. It is important not to miss your dose. Call your doctor or health care professional if you are unable to keep an appointment. What may interact with this medicine? -adalimumab -anakinra -infliximab -medicines that suppress your immune system -medicines that treat or prevent blood clots like warfarin, enoxaparin, and dalteparin -medicines to treat cancer This list may not describe all possible interactions. Give your health care provider a list of all the medicines, herbs, non-prescription drugs, or dietary supplements you use. Also tell them if you smoke, drink alcohol, or use illegal drugs. Some items may interact with your medicine. What should I watch for while using this medicine? Visit your doctor for regular check-ups as directed. This vaccine, like all vaccines, may not fully protect everyone. What side effects may I notice from receiving this  medicine? Side effects that you should report to your doctor or health care professional as soon as possible: -allergic reactions like skin rash, itching or hives, swelling of the face, lips, or tongue -breathing problems -extreme changes in behavior -fever over 100 degrees  F -pain, tingling, numbness in the hands or feet -seizures -unusually weak or tired Side effects that usually do not require medical attention (report to your doctor or health care professional if they continue or are bothersome): -aches or pains -bruising, pain, swelling at site where injected -diarrhea -headache -loss of appetite -low-grade fever of 100 degrees F or less -nausea, vomiting -sleepy This list may not describe all possible side effects. Call your doctor for medical advice about side effects. You may report side effects to FDA at 1-800-FDA-1088. Where should I keep my medicine? This drug is given in a hospital or clinic and will not be stored at home. NOTE: This sheet is a summary. It may not cover all possible information. If you have questions about this medicine, talk to your doctor, pharmacist, or health care provider.  2018 Elsevier/Gold Standard (2014-01-05 13:43:01)  Diphtheria, Tetanus, Acellular Pertussis, Hepatitis Tresean, Poliovirus Vaccine What is this medicine? DIPHTHERIA TOXOID, TETANUS TOXOID, ACELLULAR PERTUSSIS VACCINE, DTaP; HEPATITIS Basheer VACCINE, RECOMBINANT; INACTIVATED POLIOVIRUS VACCINE, IPV (dif THEER ee uh TOK soid, TET n [place] TOK soid, ey SEL yuh ler per TUS iss VAK seen, DTaP; hep uh TAHY tis Alfie VAK seen; in ak tuh vey ted poh lee oh vahy ruhs VAK seen, IPV ) is used to prevent infections of diphtheria, tetanus (lockjaw), pertussis (whooping cough), hepatitis Erez, and poliovirus. This medicine may be used for other purposes; ask your health care provider or pharmacist if you have questions. COMMON BRAND NAME(S): Pediarix What should I tell my health care provider before I take this medicine? They need to know if you have any of these conditions: -infection with fever -neurological disease -seizure disorder -an unusual or allergic reaction to vaccines, yeast, neomycin, polymyxin [place], latex, other medicines, foods, dyes, or preservatives -pregnant or trying to  get pregnant -breast-feeding How should I use this medicine? This vaccine is for injection into a muscle. It is given by a health care professional. A copy of Vaccine Information Statements will be given before each vaccination. Read this sheet carefully each time. The sheet may change frequently. Talk to your pediatrician regarding the use of this medicine in children. While this drug may be prescribed for children as young as 8 weeks old for selected conditions, precautions do apply. Overdosage: If you think you have taken too much of this medicine contact a poison control center or emergency room at once. NOTE: This medicine is only for you. Do not share this medicine with others. What if I miss a dose? Keep appointments for follow-up (booster) doses as directed. It is important not to miss your dose. Call your doctor or health care professional if you are unable to keep an appointment. What may interact with this medicine? -adalimumab -anakinra -infliximab -medicines that suppress your immune system -medicines to treat cancer -steroid medicines like prednisone or cortisone This list may not describe all possible interactions. Give your health care provider a list of all the medicines, herbs, non-prescription drugs, or dietary supplements you use. Also tell them if you smoke, drink alcohol, or use illegal drugs. Some items may interact with your medicine. What should I watch for while using this medicine? Visit your doctor for regular check-ups as directed. This vaccine, like all  vaccines, may not fully protect everyone. What side effects may I notice from receiving this medicine? Side effects that you should report to your doctor or health care professional as soon as possible: -allergic reactions like skin rash, itching or hives, swelling of the face, lips, or tongue -blueish color to lips or nail beds -breathing problems -extreme changes in behavior -fever over 101 degrees  F -inconsolable crying for 3 hours or more -seizures -unusual bruising or bleeding -unusually weak or tired Side effects that usually do not require medical attention (report to your doctor or health care professional if they continue or are bothersome): -aches or pains -bruising, pain, swelling at site where injected -diarrhea -fussy -low-grade fever -loss of appetite -sleepy -vomiting This list may not describe all possible side effects. Call your doctor for medical advice about side effects. You may report side effects to FDA at 1-800-FDA-1088. Where should I keep my medicine? This drug is given in a hospital or clinic and will not be stored at home. NOTE: This sheet is a summary. It may not cover all possible information. If you have questions about this medicine, talk to your doctor, pharmacist, or health care provider.  2018 Elsevier/Gold Standard (2008-02-03 20:51:02)

## 2017-05-06 ENCOUNTER — Other Ambulatory Visit: Payer: Self-pay | Admitting: Oncology

## 2017-05-07 LAB — IMMUNOFIXATION ELECTROPHORESIS
IGA/IMMUNOGLOBULIN A, SERUM: 212 mg/dL (ref 61–437)
IGM (IMMUNOGLOBIN M), SRM: 42 mg/dL (ref 20–172)
IgG, Qn, Serum: 1235 mg/dL (ref 700–1600)
Total Protein: 6.7 g/dL (ref 6.0–8.5)

## 2017-05-10 ENCOUNTER — Other Ambulatory Visit: Payer: Self-pay | Admitting: Oncology

## 2017-05-22 ENCOUNTER — Telehealth: Payer: Self-pay

## 2017-05-22 NOTE — Telephone Encounter (Signed)
Call from [facility] requesting [employer] authorization number for pomalyst. Fax [phone] phone [phone]

## 2017-05-23 ENCOUNTER — Other Ambulatory Visit: Payer: Self-pay

## 2017-05-23 MED ORDER — POMALIDOMIDE 1 MG PO CAPS: ORAL_CAPSULE | ORAL | 0 refills | Status: DC

## 2017-05-24 MED ORDER — POMALIDOMIDE 1 MG PO CAPS: ORAL_CAPSULE | ORAL | 0 refills | Status: DC

## 2017-05-24 NOTE — Telephone Encounter (Signed)
I had also faxed this auth # earlier this morning

## 2017-05-24 NOTE — Telephone Encounter (Addendum)
FYI "Anderson Malta with [employer] calling in reference to shipment of Pomalyst for this patient.  Call [phone].  Fax authorization to [phone]."  This nurse resent eRx with [place] number obtained yesterday in order comments.

## 2017-05-24 NOTE — Telephone Encounter (Signed)
Nicole, FYI

## 2017-05-24 NOTE — Addendum Note (Signed)
Addended by: Cherylynn Ridges on: 05/24/2017 03:23 PM   Modules accepted: Orders

## 2017-05-30 ENCOUNTER — Other Ambulatory Visit: Payer: Self-pay

## 2017-05-30 DIAGNOSIS — C9000 Multiple myeloma not having achieved remission: Secondary | ICD-10-CM

## 2017-05-31 ENCOUNTER — Ambulatory Visit (HOSPITAL_BASED_OUTPATIENT_CLINIC_OR_DEPARTMENT_OTHER): Payer: Commercial Managed Care - PPO

## 2017-05-31 ENCOUNTER — Other Ambulatory Visit (HOSPITAL_BASED_OUTPATIENT_CLINIC_OR_DEPARTMENT_OTHER): Payer: Commercial Managed Care - PPO

## 2017-05-31 VITALS — BP 120/69 | HR 58 | Temp 98.8°F | Resp 18

## 2017-05-31 DIAGNOSIS — C9000 Multiple myeloma not having achieved remission: Secondary | ICD-10-CM

## 2017-05-31 DIAGNOSIS — Z23 Encounter for immunization: Secondary | ICD-10-CM | POA: Diagnosis not present

## 2017-05-31 DIAGNOSIS — Z5112 Encounter for antineoplastic immunotherapy: Secondary | ICD-10-CM | POA: Diagnosis not present

## 2017-05-31 DIAGNOSIS — Z9481 Bone marrow transplant status: Secondary | ICD-10-CM

## 2017-05-31 DIAGNOSIS — C9002 Multiple myeloma in relapse: Secondary | ICD-10-CM

## 2017-05-31 DIAGNOSIS — R9089 Other abnormal findings on diagnostic imaging of central nervous system: Secondary | ICD-10-CM

## 2017-05-31 LAB — COMPREHENSIVE METABOLIC PANEL
ALBUMIN: 3.6 g/dL (ref 3.5–5.0)
ALT: 32 U/L (ref 0–55)
ANION GAP: 9 meq/L (ref 3–11)
AST: 29 U/L (ref 5–34)
Alkaline Phosphatase: 88 U/L (ref 40–150)
BILIRUBIN TOTAL: 0.48 mg/dL (ref 0.20–1.20)
BUN: 24.8 mg/dL (ref 7.0–26.0)
CALCIUM: 9.7 mg/dL (ref 8.4–10.4)
CO2: 20 mEq/L — ABNORMAL LOW (ref 22–29)
Chloride: 107 mEq/L (ref 98–109)
Creatinine: 1.2 mg/dL (ref 0.7–1.3)
EGFR: 64 mL/min/{1.73_m2} — AB (ref >90)
Glucose: 95 mg/dl (ref 70–140)
Potassium: 4.8 mEq/L (ref 3.5–5.1)
Sodium: 136 mEq/L (ref 136–145)
TOTAL PROTEIN: 7.5 g/dL (ref 6.4–8.3)

## 2017-05-31 LAB — CBC WITH DIFFERENTIAL/PLATELET
BASO%: 1.1 % (ref 0.0–2.0)
BASOS ABS: 0 10*3/uL (ref 0.0–0.1)
EOS%: 5.3 % (ref 0.0–7.0)
Eosinophils Absolute: 0.2 10*3/uL (ref 0.0–0.5)
HEMATOCRIT: 40.2 % (ref 38.4–49.9)
HEMOGLOBIN: 14 g/dL (ref 13.0–17.1)
LYMPH%: 29.4 % (ref 14.0–49.0)
MCH: 37.5 pg — AB (ref 27.2–33.4)
MCHC: 34.8 g/dL (ref 32.0–36.0)
MCV: 107.8 fL — AB (ref 79.3–98.0)
MONO#: 0.5 10*3/uL (ref 0.1–0.9)
MONO%: 12.2 % (ref 0.0–14.0)
NEUT#: 2.3 10*3/uL (ref 1.5–6.5)
NEUT%: 52 % (ref 39.0–75.0)
Platelets: 138 10*3/uL — ABNORMAL LOW (ref 140–400)
RBC: 3.73 10*6/uL — ABNORMAL LOW (ref 4.20–5.82)
RDW: 14.5 % (ref 11.0–14.6)
WBC: 4.4 10*3/uL (ref 4.0–10.3)
lymph#: 1.3 10*3/uL (ref 0.9–3.3)

## 2017-05-31 LAB — DRAW EXTRA CLOT TUBE

## 2017-05-31 MED ORDER — INFLUENZA VAC SPLIT QUAD 0.5 ML IM SUSY
0.5000 mL | PREFILLED_SYRINGE | Freq: Once | INTRAMUSCULAR | Status: AC
  Administered 2017-05-31: 0.5 mL via INTRAMUSCULAR
  Filled 2017-05-31: qty 0.5

## 2017-05-31 MED ORDER — SODIUM CHLORIDE 0.9 % IV SOLN
Freq: Once | INTRAVENOUS | Status: AC
  Administered 2017-05-31: 10:00:00 via INTRAVENOUS

## 2017-05-31 MED ORDER — PROCHLORPERAZINE MALEATE 10 MG PO TABS
ORAL_TABLET | ORAL | Status: AC
  Filled 2017-05-31: qty 1

## 2017-05-31 MED ORDER — BORTEZOMIB CHEMO SQ INJECTION 3.5 MG (2.5MG/ML)
1.3000 mg/m2 | Freq: Once | INTRAMUSCULAR | Status: AC
  Administered 2017-05-31: 2.5 mg via SUBCUTANEOUS
  Filled 2017-05-31: qty 2.5

## 2017-05-31 MED ORDER — ZOLEDRONIC ACID 4 MG/100ML IV SOLN
4.0000 mg | Freq: Once | INTRAVENOUS | Status: AC
  Administered 2017-05-31: 4 mg via INTRAVENOUS
  Filled 2017-05-31: qty 100

## 2017-05-31 MED ORDER — PROCHLORPERAZINE MALEATE 10 MG PO TABS
10.0000 mg | ORAL_TABLET | Freq: Once | ORAL | Status: AC
  Administered 2017-05-31: 10 mg via ORAL

## 2017-05-31 NOTE — Patient Instructions (Addendum)
[facility] Discharge Instructions for Patients Receiving Chemotherapy  Today you received the following chemotherapy agents: Velcade  To help prevent nausea and vomiting after your treatment, we encourage you to take your nausea medication as directed.    If you develop nausea and vomiting that is not controlled by your nausea medication, call the clinic.   BELOW ARE SYMPTOMS THAT SHOULD BE REPORTED IMMEDIATELY:  *FEVER GREATER THAN 100.5 F  *CHILLS WITH OR WITHOUT FEVER  NAUSEA AND VOMITING THAT IS NOT CONTROLLED WITH YOUR NAUSEA MEDICATION  *UNUSUAL SHORTNESS OF BREATH  *UNUSUAL BRUISING OR BLEEDING  TENDERNESS IN MOUTH AND THROAT WITH OR WITHOUT PRESENCE OF ULCERS  *URINARY PROBLEMS  *BOWEL PROBLEMS  UNUSUAL RASH Items with * indicate a potential emergency and should be followed up as soon as possible.  Feel free to call the clinic you have any questions or concerns. The clinic phone number is [phone].  Please show the [facility] at check-in to the Emergency Department and triage nurse.     Zoledronic Acid injection (Hypercalcemia, Oncology) What is this medicine? ZOLEDRONIC ACID (ZOE le dron ik AS id) lowers the amount of calcium loss from bone. It is used to treat too much calcium in your blood from cancer. It is also used to prevent complications of cancer that has spread to the bone. This medicine may be used for other purposes; ask your health care provider or pharmacist if you have questions. COMMON BRAND NAME(S): Zometa What should I tell my health care provider before I take this medicine? They need to know if you have any of these conditions: -aspirin-sensitive asthma -cancer, especially if you are receiving medicines used to treat cancer -dental disease or wear dentures -infection -kidney disease -receiving corticosteroids like dexamethasone or prednisone -an unusual or allergic reaction to zoledronic acid, other medicines,  foods, dyes, or preservatives -pregnant or trying to get pregnant -breast-feeding How should I use this medicine? This medicine is for infusion into a vein. It is given by a health care professional in a hospital or clinic setting. Talk to your pediatrician regarding the use of this medicine in children. Special care may be needed. Overdosage: If you think you have taken too much of this medicine contact a poison control center or emergency room at once. NOTE: This medicine is only for you. Do not share this medicine with others. What if I miss a dose? It is important not to miss your dose. Call your doctor or health care professional if you are unable to keep an appointment. What may interact with this medicine? -certain antibiotics given by injection -NSAIDs, medicines for pain and inflammation, like ibuprofen or naproxen -some diuretics like bumetanide, furosemide -teriparatide -thalidomide This list may not describe all possible interactions. Give your health care provider a list of all the medicines, herbs, non-prescription drugs, or dietary supplements you use. Also tell them if you smoke, drink alcohol, or use illegal drugs. Some items may interact with your medicine. What should I watch for while using this medicine? Visit your doctor or health care professional for regular checkups. It may be some time before you see the benefit from this medicine. Do not stop taking your medicine unless your doctor tells you to. Your doctor may order blood tests or other tests to see how you are doing. Women should inform their doctor if they wish to become pregnant or think they might be pregnant. There is a potential for serious side effects to an unborn  child. Talk to your health care professional or pharmacist for more information. You should make sure that you get enough calcium and vitamin D while you are taking this medicine. Discuss the foods you eat and the vitamins you take with your health  care professional. Some people who take this medicine have severe bone, joint, and/or muscle pain. This medicine may also increase your risk for jaw problems or a broken thigh bone. Tell your doctor right away if you have severe pain in your jaw, bones, joints, or muscles. Tell your doctor if you have any pain that does not go away or that gets worse. Tell your dentist and dental surgeon that you are taking this medicine. You should not have major dental surgery while on this medicine. See your dentist to have a dental exam and fix any dental problems before starting this medicine. Take good care of your teeth while on this medicine. Make sure you see your dentist for regular follow-up appointments. What side effects may I notice from receiving this medicine? Side effects that you should report to your doctor or health care professional as soon as possible: -allergic reactions like skin rash, itching or hives, swelling of the face, lips, or tongue -anxiety, confusion, or depression -breathing problems -changes in vision -eye pain -feeling faint or lightheaded, falls -jaw pain, especially after dental work -mouth sores -muscle cramps, stiffness, or weakness -redness, blistering, peeling or loosening of the skin, including inside the mouth -trouble passing urine or change in the amount of urine Side effects that usually do not require medical attention (report to your doctor or health care professional if they continue or are bothersome): -bone, joint, or muscle pain -constipation -diarrhea -fever -hair loss -irritation at site where injected -loss of appetite -nausea, vomiting -stomach upset -trouble sleeping -trouble swallowing -weak or tired This list may not describe all possible side effects. Call your doctor for medical advice about side effects. You may report side effects to FDA at 1-800-FDA-1088. Where should I keep my medicine? This drug is given in a hospital or clinic and  will not be stored at home. NOTE: This sheet is a summary. It may not cover all possible information. If you have questions about this medicine, talk to your doctor, pharmacist, or health care provider.  2018 Elsevier/Gold Standard (2014-01-31 14:19:39)

## 2017-05-31 NOTE — Progress Notes (Signed)
Clarified orders with Dr. [name], Per Dr. [name] pt to receive Zometa today, Pt in agreement to plan. Per Dr. [name] okay for pt to receive flu shot.

## 2017-06-04 LAB — MULTIPLE MYELOMA PANEL, SERUM
ALBUMIN SERPL ELPH-MCNC: 3.4 g/dL (ref 2.9–4.4)
ALPHA 1: 0.2 g/dL (ref 0.0–0.4)
Albumin/Glob SerPl: 1.1 (ref 0.7–1.7)
Alpha2 Glob SerPl Elph-Mcnc: 0.7 g/dL (ref 0.4–1.0)
B-Globulin SerPl Elph-Mcnc: 1 g/dL (ref 0.7–1.3)
Gamma Glob SerPl Elph-Mcnc: 1.4 g/dL (ref 0.4–1.8)
Globulin, Total: 3.3 g/dL (ref 2.2–3.9)
IGA/IMMUNOGLOBULIN A, SERUM: 203 mg/dL (ref 61–437)
IGM (IMMUNOGLOBIN M), SRM: 54 mg/dL (ref 20–172)
IgG, Qn, Serum: 1354 mg/dL (ref 700–1600)
TOTAL PROTEIN: 6.7 g/dL (ref 6.0–8.5)

## 2017-06-18 ENCOUNTER — Telehealth: Payer: Self-pay | Admitting: Oncology

## 2017-06-18 NOTE — Telephone Encounter (Signed)
06/15/17 prescription refill °

## 2017-06-20 ENCOUNTER — Other Ambulatory Visit: Payer: Self-pay | Admitting: Oncology

## 2017-06-20 NOTE — Telephone Encounter (Signed)
06/19/17 prescription refill. Prescription scanned in media tab °

## 2017-06-21 ENCOUNTER — Other Ambulatory Visit (HOSPITAL_BASED_OUTPATIENT_CLINIC_OR_DEPARTMENT_OTHER): Payer: Commercial Managed Care - PPO

## 2017-06-21 ENCOUNTER — Ambulatory Visit (HOSPITAL_BASED_OUTPATIENT_CLINIC_OR_DEPARTMENT_OTHER): Payer: Commercial Managed Care - PPO | Admitting: Adult Health

## 2017-06-21 ENCOUNTER — Other Ambulatory Visit: Payer: Self-pay

## 2017-06-21 ENCOUNTER — Other Ambulatory Visit: Payer: Self-pay | Admitting: *Deleted

## 2017-06-21 ENCOUNTER — Telehealth: Payer: Self-pay | Admitting: Adult Health

## 2017-06-21 ENCOUNTER — Ambulatory Visit (HOSPITAL_BASED_OUTPATIENT_CLINIC_OR_DEPARTMENT_OTHER): Payer: Commercial Managed Care - PPO

## 2017-06-21 ENCOUNTER — Encounter: Payer: Self-pay | Admitting: Adult Health

## 2017-06-21 VITALS — BP 124/69 | HR 62 | Temp 98.0°F | Resp 18 | Ht 69.0 in | Wt 187.8 lb

## 2017-06-21 DIAGNOSIS — Z9481 Bone marrow transplant status: Secondary | ICD-10-CM

## 2017-06-21 DIAGNOSIS — C9000 Multiple myeloma not having achieved remission: Secondary | ICD-10-CM

## 2017-06-21 DIAGNOSIS — Z5112 Encounter for antineoplastic immunotherapy: Secondary | ICD-10-CM

## 2017-06-21 DIAGNOSIS — R9089 Other abnormal findings on diagnostic imaging of central nervous system: Secondary | ICD-10-CM

## 2017-06-21 LAB — CBC WITH DIFFERENTIAL/PLATELET
BASO%: 0.9 % (ref 0.0–2.0)
BASOS ABS: 0 10*3/uL (ref 0.0–0.1)
EOS%: 5.4 % (ref 0.0–7.0)
Eosinophils Absolute: 0.2 10*3/uL (ref 0.0–0.5)
HCT: 38.3 % — ABNORMAL LOW (ref 38.4–49.9)
HGB: 13.3 g/dL (ref 13.0–17.1)
LYMPH%: 36.2 % (ref 14.0–49.0)
MCH: 36.7 pg — ABNORMAL HIGH (ref 27.2–33.4)
MCHC: 34.7 g/dL (ref 32.0–36.0)
MCV: 105.8 fL — AB (ref 79.3–98.0)
MONO#: 0.7 10*3/uL (ref 0.1–0.9)
MONO%: 16.4 % — ABNORMAL HIGH (ref 0.0–14.0)
NEUT%: 41.1 % (ref 39.0–75.0)
NEUTROS ABS: 1.8 10*3/uL (ref 1.5–6.5)
PLATELETS: 126 10*3/uL — AB (ref 140–400)
RBC: 3.62 10*6/uL — AB (ref 4.20–5.82)
RDW: 14.1 % (ref 11.0–14.6)
WBC: 4.3 10*3/uL (ref 4.0–10.3)
lymph#: 1.6 10*3/uL (ref 0.9–3.3)

## 2017-06-21 LAB — COMPREHENSIVE METABOLIC PANEL
ALT: 20 U/L (ref 0–55)
AST: 22 U/L (ref 5–34)
Albumin: 3.5 g/dL (ref 3.5–5.0)
Alkaline Phosphatase: 80 U/L (ref 40–150)
Anion Gap: 8 mEq/L (ref 3–11)
BILIRUBIN TOTAL: 0.54 mg/dL (ref 0.20–1.20)
BUN: 23.6 mg/dL (ref 7.0–26.0)
CO2: 20 meq/L — AB (ref 22–29)
CREATININE: 1.1 mg/dL (ref 0.7–1.3)
Calcium: 9.6 mg/dL (ref 8.4–10.4)
Chloride: 108 mEq/L (ref 98–109)
EGFR: 71 mL/min/{1.73_m2} — ABNORMAL LOW (ref >90)
GLUCOSE: 86 mg/dL (ref 70–140)
Potassium: 4.2 mEq/L (ref 3.5–5.1)
SODIUM: 136 meq/L (ref 136–145)
TOTAL PROTEIN: 7.2 g/dL (ref 6.4–8.3)

## 2017-06-21 MED ORDER — PROCHLORPERAZINE MALEATE 10 MG PO TABS
ORAL_TABLET | ORAL | Status: AC
  Filled 2017-06-21: qty 1

## 2017-06-21 MED ORDER — POMALIDOMIDE 1 MG PO CAPS: ORAL_CAPSULE | ORAL | 0 refills | Status: DC

## 2017-06-21 MED ORDER — PROCHLORPERAZINE MALEATE 10 MG PO TABS
10.0000 mg | ORAL_TABLET | Freq: Once | ORAL | Status: AC
  Administered 2017-06-21: 10 mg via ORAL

## 2017-06-21 MED ORDER — BORTEZOMIB CHEMO SQ INJECTION 3.5 MG (2.5MG/ML)
1.3000 mg/m2 | Freq: Once | INTRAMUSCULAR | Status: AC
  Administered 2017-06-21: 2.5 mg via SUBCUTANEOUS
  Filled 2017-06-21: qty 2.5

## 2017-06-21 NOTE — Patient Instructions (Signed)
[facility] Discharge Instructions for Patients Receiving Chemotherapy  Today you received the following chemotherapy agents Velcade.  To help prevent nausea and vomiting after your treatment, we encourage you to take your nausea medication as directed.  If you develop nausea and vomiting that is not controlled by your nausea medication, call the clinic.   BELOW ARE SYMPTOMS THAT SHOULD BE REPORTED IMMEDIATELY:  *FEVER GREATER THAN 100.5 F  *CHILLS WITH OR WITHOUT FEVER  NAUSEA AND VOMITING THAT IS NOT CONTROLLED WITH YOUR NAUSEA MEDICATION  *UNUSUAL SHORTNESS OF BREATH  *UNUSUAL BRUISING OR BLEEDING  TENDERNESS IN MOUTH AND THROAT WITH OR WITHOUT PRESENCE OF ULCERS  *URINARY PROBLEMS  *BOWEL PROBLEMS  UNUSUAL RASH Items with * indicate a potential emergency and should be followed up as soon as possible.  Feel free to call the clinic should you have any questions or concerns. The clinic phone number is [phone].  Please show the CHEMO ALERT CARD at check-in to the Emergency Department and triage nurse.   

## 2017-06-21 NOTE — Progress Notes (Signed)
ID: [name]   DOB: Jan 31, 1951  MR#: [id number]  WUJ#:[id number]   PCP: Andre [name] SU: OTHER MD: Andre [name], Andre [name], [address] [place] [phone]), Andre [name]  CHIEF COMPLAINT: Multiple myeloma  CURRENT TREATMENT: Maintenance therapy with bortezomib, zolendronate, and pomalidomide  INTERVAL HISTORY:  Andre [name] is doing well today.  He is here for evaluation of his multiple myeloma.  He is taking Pomalidomide 74m three weeks on, one week off.  He doesn't particularly like it.  He does note mild fatigue and constipation with it.  He also receives Zolendronate every 3 months, next due in December, 2018.  He is also receiving Bortezomib monthly.  He is due for this today.  He wants to know how much longer he is going to have to take this treatment and if he can perhaps take a break from it.    REVIEW OF SYSTEMS: He is doing well today.  He did receive his flu shot this year.  He denies peripheral neuropathy, or any other concerns.    HISTORY OF PRESENT ILLNESS:  From the original intake nodes:  The patient was worked up for peptic ulcer disease in August of 2011, with significant bleeding and anemia.  The patient was Helicobacter pylori negative.  He received some epinephrine when he had his EGD and then started on Protonix.  [name] anemia slowly resolved so that by September, his hemoglobin was up to 10.9 and by earlier this month, his hemoglobin was up to 12.5.    As part of his general workup, he was found to have a slightly elevated globulin fraction.  In September, his total protein was 8.3 with an albumin of 3.8.  In January, the total protein was 8.4 and albumin 3.6.  With persistence of this slight abnormality, Andre [name] serum immunofixation and SPEP.  The SPTP showed an M-spike of 2.67 grams.  A total IgG was 4,190.  Total IgA low at 28.  Total IgM low at 28 also.  The immunofixation showed a monoclonal IgG lambda  paraprotein.  There were also monoclonal free lambda light chains present. With this information, the patient was referred for further evaluation.   A diagnosis of myeloma was confirmed by bone marrow biopsy and subsequebnt treatment is as detailed below  PAST MEDICAL HISTORY: Past Medical History:  Diagnosis Date  . Allergy   . Anemia   . Arthritis   . Asthma    no treatment x 20 years  . Depression   . Double vision    occurs at times   . Duodenal ulcer   . GERD (gastroesophageal reflux disease)   . Hyperlipidemia   . Hypertension   . Hypothyroidism   . Multiple myeloma 07/04/2011  . Thyroid disease   Significant for peptic ulcer disease as noted above.  History of hyperlipidemia.  History of anxiety and depression.  History of GERD which is significantly improved with weight loss and history of reactive airway disease, possibly secondary to the GERD which also improved with weight loss.  There was a history of obesity, now much improved secondary to exercise and diet.   PAST SURGICAL HISTORY: Past Surgical History:  Procedure Laterality Date  . BONE MARROW TRANSPLANT  2011   for MM  . CARDIOLITE STUDY  11/25/2003   NORMAL  . TONSILLECTOMY      FAMILY HISTORY Family History  Problem Relation Age of Onset  . Throat cancer Mother   . Hypertension Father   .  Stroke Father   . Asthma Father   . Diabetes Father   . Pancreatic cancer Brother   [name] father died at the age of 6 following a stroke.  [name] mother died with cancer of the throat at the age of 42.   She had a history of depression and was a smoker.  Not clear how much alcohol she drank, according to the patient.  The patient had two brothers.  One died from the age of 77 from a "kidney problem."  The second one died at the age of 41 from pancreatic cancer. (This may have been duodenal cancer. 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  His wife of 37+ years was Andre [name], but they are in process of divorce, to be finalized August 2018. In 2016 he remarried, his current wife's name is Andre [name].  The patient has two daughters:  Andre [name], who works in [place], who had her second child April 2016. Both daughters live here in [place]. The patient is very close to his daughters.  Andre [name] first daughter, Andre [name], born 03/06/2013, apparently has Weber/Os[place]/Rendu. The patient is not a church attender.  Andre [name] also participates in [place] even though actually there is no alcohol or drug problem in the family.  He participates in this group and he gets quite a bit of support from it.     ADVANCED DIRECTIVES: In place  HEALTH MAINTENANCE: (updated 07/16/2013) Social History  Substance Use Topics  . Smoking status: Former Smoker    Years: 3.00    Quit date: 03/27/1969  . Smokeless tobacco: Never Used  . Alcohol use No     Colonoscopy:  PSA:  Sept 2014, normal/Andre [name]  Lipid panel: Sept 2014/Andre [name]   Allergies  Allergen Reactions  . Atorvastatin Other (See Comments)  . Rosuvastatin     Other reaction(s): Liver Disorder  . Crestor [Rosuvastatin Calcium]     ADVERSE EFFECTS ON LIVER  . Lipitor [Atorvastatin Calcium]   . Septra [Sulfamethoxazole-Trimethoprim] Rash    Current Outpatient Prescriptions  Medication Sig Dispense Refill  . aspirin 325 MG tablet Take 325 mg by mouth daily.    . cetirizine (ZYRTEC) 10 MG tablet Take 10 mg by mouth daily.    [name] docusate sodium (COLACE) 100 MG capsule Take 100 mg by mouth daily as needed for mild constipation or moderate constipation.    [name] escitalopram (LEXAPRO) 10 MG tablet take 1 tablet by mouth once daily 30 tablet 1  . feeding supplement, ENSURE ENLIVE, (ENSURE ENLIVE) LIQD Take 237 mLs by mouth 2 (two) times daily between meals. 237 mL 12  . levothyroxine (SYNTHROID,  LEVOTHROID) 150 MCG tablet take 1 tablet by mouth every morning ON AN EMPTY STOMACH 90 tablet 1  . pantoprazole (PROTONIX) 40 MG tablet take 1 tablet by mouth twice a day before meals 60 tablet 3  . pomalidomide (POMALYST) 1 MG capsule Take 1 capsule (1 mg total) by mouth daily. Take with water on days 1-21. Repeat every 28 days. 21 capsule 0  . pomalidomide (POMALYST) 1 MG capsule Take 1 capsule (1 mg total) by mouth daily. Take with water on days 1-21. Repeat every 28 days. 21 capsule 0  . ranitidine (ZANTAC) 150 MG tablet Take 150 mg by mouth 2 (two) times daily.    [name]  sildenafil (VIAGRA) 25 MG tablet Take 1 tablet (25 mg total) by mouth daily as needed for erectile dysfunction. 10 tablet 0  . valACYclovir (VALTREX) 1000 MG tablet Take 1 tablet (1,000 mg total) by mouth daily. (Patient taking differently: Take 500 mg by mouth daily. ) 60 tablet 6  . zolendronic acid (ZOMETA) 4 MG/5ML injection Inject 4 mg into the vein every 3 (three) months.    . zolpidem (AMBIEN) 5 MG tablet Take 1-2 tablets (5-10 mg total) by mouth at bedtime as needed for sleep. 30 tablet 1   Objective:   Vitals:   06/21/17 0951  BP: 124/69  Pulse: 62  Resp: 18  Temp: 98 F (36.7 C)  SpO2: 100%  Body mass index is 27.73 kg/m. Filed Weights   06/21/17 0951  Weight: 187 lb 12.8 oz (85.2 kg)  GENERAL: Patient is a well appearing male in no acute distress HEENT:  Sclerae anicteric.  PERRL.  Oropharynx clear and moist. No ulcerations or evidence of oropharyngeal candidiasis. Neck is supple.  NODES:  No cervical, supraclavicular, or axillary lymphadenopathy palpated.  BREAST EXAM:  Deferred. LUNGS:  Clear to auscultation bilaterally.  No wheezes or rhonchi. HEART:  Regular rate and rhythm. No murmur appreciated. ABDOMEN:  Soft, nontender.  Positive, normoactive bowel sounds. No organomegaly palpated. MSK:  No focal spinal tenderness to palpation. Full range of motion bilaterally in the upper extremities. EXTREMITIES:   No peripheral edema.   SKIN:  Clear with no obvious rashes or skin changes. No nail dyscrasia. NEURO:  Nonfocal. Well oriented.  Appropriate affect.    No visits with results within 1 Day(s) from this visit.  Latest known visit with results is:  Appointment on 05/31/2017  Component Date Value Ref Range Status  . IgG, Qn, Serum 05/31/2017 1,354  700 - 1600 mg/dL Final  . IgA, Qn, Serum 05/31/2017 203  61 - 437 mg/dL Final  . IgM, Qn, Serum 05/31/2017 54  20 - 172 mg/dL Final  . Total Protein 05/31/2017 6.7  6.0 - 8.5 g/dL Final  . Albumin SerPl Elph-Mcnc 05/31/2017 3.4  2.9 - 4.4 g/dL Final  . Alpha 1 05/31/2017 0.2  0.0 - 0.4 g/dL Final  . Alpha2 Glob SerPl Elph-Mcnc 05/31/2017 0.7  0.4 - 1.0 g/dL Final  . Nyan-Globulin SerPl Elph-Mcnc 05/31/2017 1.0  0.7 - 1.3 g/dL Final  . Gamma Glob SerPl Elph-Mcnc 05/31/2017 1.4  0.4 - 1.8 g/dL Final  . M Protein SerPl Elph-Mcnc 05/31/2017 Not Observed  Not Observed g/dL Final  . Globulin, Total 05/31/2017 3.3  2.2 - 3.9 g/dL Final  . Albumin/Glob SerPl 05/31/2017 1.1  0.7 - 1.7 Final  . IFE 1 05/31/2017 Comment   Final   An apparent normal immunofixation pattern.  . Please Note 05/31/2017 Comment   Final   Comment: Protein electrophoresis scan will follow via computer, mail, or courier delivery.   . WBC 05/31/2017 4.4  4.0 - 10.3 10e3/uL Final  . NEUT# 05/31/2017 2.3  1.5 - 6.5 10e3/uL Final  . HGB 05/31/2017 14.0  13.0 - 17.1 g/dL Final  . HCT 05/31/2017 40.2  38.4 - 49.9 % Final  . Platelets 05/31/2017 138* 140 - 400 10e3/uL Final  . MCV 05/31/2017 107.8* 79.3 - 98.0 fL Final  . MCH 05/31/2017 37.5* 27.2 - 33.4 pg Final  . MCHC 05/31/2017 34.8  32.0 - 36.0 g/dL Final  . RBC 05/31/2017 3.73* 4.20 - 5.82 10e6/uL Final  . RDW 05/31/2017 14.5  11.0 - 14.6 %  Final  . lymph# 05/31/2017 1.3  0.9 - 3.3 10e3/uL Final  . MONO# 05/31/2017 0.5  0.1 - 0.9 10e3/uL Final  . Eosinophils Absolute 05/31/2017 0.2  0.0 - 0.5 10e3/uL Final  . Basophils  Absolute 05/31/2017 0.0  0.0 - 0.1 10e3/uL Final  . NEUT% 05/31/2017 52.0  39.0 - 75.0 % Final  . LYMPH% 05/31/2017 29.4  14.0 - 49.0 % Final  . MONO% 05/31/2017 12.2  0.0 - 14.0 % Final  . EOS% 05/31/2017 5.3  0.0 - 7.0 % Final  . BASO% 05/31/2017 1.1  0.0 - 2.0 % Final  . Extra Tube 05/31/2017 Sample held in lab for 7 days   Final  . Sodium 05/31/2017 136  136 - 145 mEq/L Final  . Potassium 05/31/2017 4.8  3.5 - 5.1 mEq/L Final  . Chloride 05/31/2017 107  98 - 109 mEq/L Final  . CO2 05/31/2017 20* 22 - 29 mEq/L Final  . Glucose 05/31/2017 95  70 - 140 mg/dl Final   Glucose reference range is for nonfasting patients. Fasting glucose reference range is 70- 100.  [name] BUN 05/31/2017 24.8  7.0 - 26.0 mg/dL Final  . Creatinine 05/31/2017 1.2  0.7 - 1.3 mg/dL Final  . Total Bilirubin 05/31/2017 0.48  0.20 - 1.20 mg/dL Final  . Alkaline Phosphatase 05/31/2017 88  40 - 150 U/L Final  . AST 05/31/2017 29  5 - 34 U/L Final  . ALT 05/31/2017 32  0 - 55 U/L Final  . Total Protein 05/31/2017 7.5  6.4 - 8.3 g/dL Final  . Albumin 05/31/2017 3.6  3.5 - 5.0 g/dL Final  . Calcium 05/31/2017 9.7  8.4 - 10.4 mg/dL Final  . Anion Gap 05/31/2017 9  3 - 11 mEq/L Final  . EGFR 05/31/2017 64* >90 ml/min/1.73 m2 Final   eGFR is calculated using the CKD-EPI Creatinine Equation (2009)   Immunoglobulin studies at [facility] showed the Lambda ratio to be 1.74, total IgG 1260, total IgA 208, and the IgM 49. IgG heavy chain assay is pending  STUDIES: Myeloma studies 04/05/2017 showed a total IgG of 09/18/2004 total IgA 178 total IgM 42, all in the normal range, with an IgG Monoclonal component not otherwise quantified  ASSESSMENT: 65 y.o.  [place] man with a history of multiple myeloma diagnosed February of 2012, with an initial M-spike of 2.6 g/dL, IFE showing an IgG lambda paraprotein and free lambda light chains in the urine. Cytogenetics showed trisomy 61. Bone marrow biopsy showed a 22% plasmacytosis. Treated with    (1) bortezomib (subcutaneously), lenalidomide, and dexamethasone, with repeat bone marrow biopsy May of 2012 showing 10% plasmacytosis   (2) high-dose chemotherapy with BCNU and melphalan at [facility], followed by stem cell rescue July of 2012   (3) on zoledronic acid started December of 2012, initially monthly, currently given every 3 months, most recent dose  12/07/2015  (4) low-dose lenalidomide resumed April 2013, interrupted several times.  Resumed again on 02/19/2013, ata dose of 5 mg daily, 21 days on and 7 days off, later further reduced to 7 days on, 7 days off  (5) CNS symptoms and abnormal brain MRI extensively evaluated by neurology with no definitive diagnosis established  (6) rising M spike noted June 2014 but did not meet criteria for carfilzomib study  (7) on lenalidomide 25 mg daily, 14 days on, 7 days off, starting 04/18/2013, interrupted December 2014 because of rash;   (a) resumed January 2015 at 10 mg/ day at 21 days on/ 7  days off  (Adarsh) starting 08/18/2014 decreased to 10 mg/ day 14 days on, 7 days off because of cytopenias  (c) as of February 2016 was on 5 mg daily 7 days on 7 days off  (d) lenalidomide discontinued December 2016 with evidence of disease progression  (8) transient global amnesia 05/29/2015, resolved without intervention  (9) starting PVD 10/25/2015 w ASA 325 thromboprophylaxis, valacyclovir prophylaxis, last dose 12/17/2015  (a) pomalidomide 4 mg/d days 1-14  (Dreyson) bortezomib sQ days 2,5,9,12 of each 21 day cycle  (c) dexamethasome 20 mg two days a week  (10) metapneumovirus pneumonia April 2017  (a) completing course of steroids and week of bactrim mid April 2017  (11) status post second autologous transplant at [facility] 02/04/2016(preparatory regimen melphalan 200 mg/m)  (a) received twelve-month vaccinations 03/14/2017 (DPT, Haemophilus, Pneumovax 13, polio)  (Pearse) 14 months injections 05/04/2016 include DTaP, Hib conjugate, HepB energex Andre [name] 20 mcg/ml,  Prevnar 13  (c) 24 month vaccines due at [facility] June 2019  (12) maintenance therapy started November 2017, consisting of  (a) bortezomib 1.3 mg/M2 QOW, first dose 07/27/2016  (Jeptha) pomalidomide 1 mg days 1-21 Q28 days, started 07/19/2016  (c) zolendronate monthly started 07/27/2016 (previously Q 3 months) however patient unable to tolerate, and changed back to q3 month in April, 2018   (d) Bortezomib changed to monthly as of June 2018 because of tolerance issues  PLAN:  Andre [name] is doing well today.  He will proceed with Bortezomib today (labs currently pending and assuming they are within parameters).  He will continue taking Pomalidomide 21 days on and 7 days off.  Due to his desire to stop his therapy, and wait for recurrence, I talked to him about this choice in detail and he will see Dr. [name] in 4 weeks prior to his next Bortezomib to discuss it further.  He knows to call for any questions or concerns prior to his next appointment with [place].    A total of (30) minutes of face-to-face time was spent with this patient with greater than 50% of that time in counseling and care-coordination.    Scot Dock 06/21/2017  Medical Oncology and Hematology [facility] [address] [place], [place] [address] Tel. [phone]    Fax. [phone]  For reference, this is a summary of the 03/14/2017 note AndreGasparetto, who is helping as manage Andre [name]'s case: 1. MM labs: SPEP: M-spike: 0.19; FLC: kappa: 3.46, lambda: 2.13, ratio: 1.62; IFE: IgG Kappa and lambda light chain.  2. IgG Heavy Chain assasy: sent to [facility] clinic: pending 3. Continue Valtrex 500 twice daily.  4. Recommend maintenance Velcade 1.69m/m2 monthly, pomalidomide 258mD1-D21 based on tolerability.  5. Recommend Zometa q 73m30m 6. Pneumo-vax at 6 mths 7. 12 mth vaccinations administered today.  8. 14 mth vaccinations to be administered at home.  9. RTC in 12 mths or sooner if needed; 24 mth vaccinations to be  administered at this time

## 2017-06-21 NOTE — Telephone Encounter (Signed)
Appointments complete per 10/4 los - patient to get printout in infusion. Follow date change per LC. Per [place] patient will see GM 11/1 with velcade and have velcade again after 11/29 f/u with GM.

## 2017-06-26 LAB — MULTIPLE MYELOMA PANEL, SERUM
Albumin SerPl Elph-Mcnc: 3.2 g/dL (ref 2.9–4.4)
Albumin/Glob SerPl: 1 (ref 0.7–1.7)
Alpha 1: 0.2 g/dL (ref 0.0–0.4)
Alpha2 Glob SerPl Elph-Mcnc: 0.8 g/dL (ref 0.4–1.0)
B-Globulin SerPl Elph-Mcnc: 1.1 g/dL (ref 0.7–1.3)
Gamma Glob SerPl Elph-Mcnc: 1.3 g/dL (ref 0.4–1.8)
Globulin, Total: 3.4 g/dL (ref 2.2–3.9)
IgA, Qn, Serum: 189 mg/dL (ref 61–437)
IgG, Qn, Serum: 1334 mg/dL (ref 700–1600)
IgM, Qn, Serum: 46 mg/dL (ref 20–172)
Total Protein: 6.6 g/dL (ref 6.0–8.5)

## 2017-07-15 ENCOUNTER — Other Ambulatory Visit: Payer: Self-pay | Admitting: Oncology

## 2017-07-17 NOTE — Progress Notes (Signed)
[place]  [phone] [phone]    ID: [id number]  DOB: 1951/02/25  MR#: [id number] CSN#: [id number]   Patient Care Team: Cassandria Anger, MD as PCP - General (Internal Medicine) Rasha Ibe, [name], MD as Consulting Physician (Oncology) Concepcion Living, MD as Consulting Physician (Neurology) [name], MD as Consulting Physician (Internal Medicine) Kathrynn Ducking, MD as Consulting Physician (Neurology) Dorna Bloom, [place] as Referring Physician (Oral Surgery)   CHIEF COMPLAINT: Multiple myeloma  CURRENT TREATMENT: Maintenance therapy with bortezomib, zolendronate, and pomalidomide  INTERVAL HISTORY:  Andre [name] returns today for a follow-up of his multiple myeloma. He is receiving Bortezomib fully and notes fatigue, nausea and mild neuropathy. When he takes Pomalidomide, 11m three weeks on, one week off, he will experience fatigue and diarrhea. His last infusion of Zolendronate was in September and he is due for his next treatment in December, however he will ercevie treatment today so that the treatments do not overlap with the holidays. He notes that it his side affects are worse at first, but improve with time. He did get his flu shot.      REVIEW OF SYSTEMS: Andre Reichmannreports he is doing well and staying busy, working. He notes it is hard to do the whole day due to fatigue. He is also spending time with his grandchildren. He is walking about 1.5 miles per day. He did go swimming yesterday, 07/18/2017. [place]. He denies unusual headaches, visual changes, nausea, vomiting, or dizziness. There has been no unusual cough, phlegm production, or pleurisy. This been no change in bowel or bladder habits. She denies unexplained fatigue or unexplained weight loss, bleeding, rash, or fever. A detailed review of systems was  otherwise entirely stable.    HISTORY OF PRESENT ILLNESS:  From the original intake nodes:  The patient was worked up for peptic ulcer disease in August of 2011, with significant bleeding and anemia.  The patient was Helicobacter pylori negative.  He received some epinephrine when he had his EGD and then started on Protonix.  [name] anemia slowly resolved so that by September, his hemoglobin was up to 10.9 and by earlier this month, his hemoglobin was up to 12.5.    As part of his general workup, he was found to have a slightly elevated globulin fraction.  In September, his total protein was 8.3 with an albumin of 3.8.  In January, the total protein was 8.4 and albumin 3.6.  With persistence of this slight abnormality, Dr. BOlevia Perchesobtained serum immunofixation and SPEP.  The SPTP showed an M-spike of 2.67 grams.  A total IgG was 4,190.  Total IgA low at 28.  Total IgM low at 28 also.  The immunofixation showed a monoclonal IgG lambda paraprotein.  There were also monoclonal free lambda light chains present. With this information, the patient was referred for further evaluation.   A diagnosis of myeloma was confirmed by bone marrow biopsy and subsequebnt treatment is as detailed below  PAST MEDICAL HISTORY: Past Medical History:  Diagnosis Date  . Allergy   . Anemia   . Arthritis   . Asthma    no treatment x 20 years  . Depression   . Double vision    occurs at times   . Duodenal ulcer   . GERD (gastroesophageal reflux disease)   . Hyperlipidemia   .  Hypertension   . Hypothyroidism   . Multiple myeloma 07/04/2011  . Thyroid disease   Significant for peptic ulcer disease as noted above.  History of hyperlipidemia.  History of anxiety and depression.  History of GERD which is significantly improved with weight loss and history of reactive airway disease, possibly secondary to the GERD which also improved with weight loss.  There was a history of obesity, now much improved secondary to  exercise and diet.   PAST SURGICAL HISTORY: Past Surgical History:  Procedure Laterality Date  . BONE MARROW TRANSPLANT  2011   for MM  . CARDIOLITE STUDY  11/25/2003   NORMAL  . TONSILLECTOMY      FAMILY HISTORY Family History  Problem Relation Age of Onset  . Throat cancer Mother   . Hypertension Father   . Stroke Father   . Asthma Father   . Diabetes Father   . Pancreatic cancer Brother   [name] father died at the age of 48 following a stroke.  [name] mother died with cancer of the throat at the age of 60.   She had a history of depression and was a smoker.  Not clear how much alcohol she drank, according to the patient.  The patient had two brothers.  One died from the age of 105 from a "kidney problem."  The second one died at the age of 49 from pancreatic cancer. (This may have been duodenal cancer. 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  His wife of 37+ years was [name], but they are in process of divorce, to be finalized August 2018. In 2016 he remarried, his current wife's name is [name].  The patient has two daughters:  [name], who works in [place], who had her second child April 2016. Both daughters live here in [place]. The patient is very close to his daughters.  [place] first daughter, [name], born 03/06/2013, apparently has Weber/Osler/Rendu. The patient is not a church attender.  Andre [name] also participates in [place] even though actually there is no alcohol or drug problem in the family.  He participates in this group and he gets quite a bit of support from it.     ADVANCED DIRECTIVES: In place  HEALTH MAINTENANCE: (updated 07/16/2013) Social History  Substance Use Topics  . Smoking status: Former Smoker    Years: 3.00    Quit date: 03/27/1969  . Smokeless tobacco: Never Used  .  Alcohol use No    Colonoscopy:  PSA:  Sept 2014, normal/Dr. [name]  Lipid panel: Sept 2014/Dr. [name]  Allergies  Allergen Reactions  . Atorvastatin Other (See Comments)  . Rosuvastatin     Other reaction(s): Liver Disorder  . Crestor [Rosuvastatin Calcium]     ADVERSE EFFECTS ON LIVER  . Lipitor [Atorvastatin Calcium]   . Septra [Sulfamethoxazole-Trimethoprim] Rash   Current Outpatient Prescriptions  Medication Sig Dispense Refill  . aspirin 325 MG tablet Take 325 mg by mouth daily.    . cetirizine (ZYRTEC) 10 MG tablet Take 10 mg by mouth daily.    [name] docusate sodium (COLACE) 100 MG capsule Take 100 mg by mouth daily as needed for mild constipation or moderate constipation.    [name] escitalopram (LEXAPRO) 10 MG tablet TAKE 1 TABLET BY MOUTH  DAILY 90 tablet 1  . feeding supplement, ENSURE  ENLIVE, (ENSURE ENLIVE) LIQD Take 237 mLs by mouth 2 (two) times daily between meals. 237 mL 12  . levothyroxine (SYNTHROID, LEVOTHROID) 150 MCG tablet TAKE 1 TABLET BY MOUTH  EVERY MORNING ON AN EMPTY  STOMACH 90 tablet 1  . ondansetron (ZOFRAN) 8 MG tablet Take 1 tablet (8 mg total) by mouth every 8 (eight) hours as needed for nausea or vomiting. 20 tablet 0  . pantoprazole (PROTONIX) 40 MG tablet TAKE 1 TABLET BY MOUTH  TWICE A DAY BEFORE MEALS 180 tablet 3  . POMALYST 1 MG capsule TAKE 1 CAPSULE BY MOUTH EVERY DAY FOR 21 DAYS, THEN 7 DAYS OFF 21 capsule 0  . ranitidine (ZANTAC) 150 MG tablet Take 150 mg by mouth 2 (two) times daily.    . sildenafil (VIAGRA) 25 MG tablet Take 1 tablet (25 mg total) by mouth daily as needed for erectile dysfunction. 10 tablet 0  . triamcinolone cream (KENALOG) 0.5 % Apply 1 application topically 3 (three) times daily. 45 g 2  . valACYclovir (VALTREX) 1000 MG tablet Take 1 tablet (1,000 mg total) by mouth daily. 180 tablet 1  . zolendronic acid (ZOMETA) 4 MG/5ML injection Inject 4 mg into the vein every 3 (three) months.     No current facility-administered  medications for this visit.    Objective: Middle-aged white man in no acute distress  Vitals:   07/19/17 0936  BP: 117/81  Pulse: 62  Resp: 18  Temp: 98 F (36.7 C)  SpO2: 100%  Body mass index is 27.64 kg/m. Filed Weights   07/19/17 0936  Weight: 187 lb 3.2 oz (84.9 kg)   Sclerae unicteric, EOMs intact Oropharynx clear and moist No cervical or supraclavicular adenopathy Lungs no rales or rhonchi Heart regular rate and rhythm Abd soft, nontender, positive bowel sounds MSK no focal spinal tenderness, no upper extremity lymphedema Neuro: nonfocal, well oriented, appropriate affect   Appointment on 07/19/2017  Component Date Value Ref Range Status  . WBC 07/19/2017 3.8* 4.0 - 10.3 10e3/uL Final  . NEUT# 07/19/2017 1.5  1.5 - 6.5 10e3/uL Final  . HGB 07/19/2017 13.1  13.0 - 17.1 g/dL Final  . HCT 07/19/2017 37.5* 38.4 - 49.9 % Final  . Platelets 07/19/2017 110* 140 - 400 10e3/uL Final  . MCV 07/19/2017 106.5* 79.3 - 98.0 fL Final  . MCH 07/19/2017 37.2* 27.2 - 33.4 pg Final  . MCHC 07/19/2017 34.9  32.0 - 36.0 g/dL Final  . RBC 07/19/2017 3.52* 4.20 - 5.82 10e6/uL Final  . RDW 07/19/2017 14.4  11.0 - 14.6 % Final  . lymph# 07/19/2017 1.3  0.9 - 3.3 10e3/uL Final  . MONO# 07/19/2017 0.8  0.1 - 0.9 10e3/uL Final  . Eosinophils Absolute 07/19/2017 0.2  0.0 - 0.5 10e3/uL Final  . Basophils Absolute 07/19/2017 0.0  0.0 - 0.1 10e3/uL Final  . NEUT% 07/19/2017 39.9  39.0 - 75.0 % Final  . LYMPH% 07/19/2017 33.6  14.0 - 49.0 % Final  . MONO% 07/19/2017 20.1* 0.0 - 14.0 % Final  . EOS% 07/19/2017 5.3  0.0 - 7.0 % Final  . BASO% 07/19/2017 1.1  0.0 - 2.0 % Final  . Sodium 07/19/2017 137  136 - 145 mEq/L Final  . Potassium 07/19/2017 4.3  3.5 - 5.1 mEq/L Final  . Chloride 07/19/2017 108  98 - 109 mEq/L Final  . CO2 07/19/2017 22  22 - 29 mEq/L Final  . Glucose 07/19/2017 98  70 - 140 mg/dl Final   Glucose reference  range is for nonfasting patients. Fasting glucose reference  range is 70- 100.  [name] BUN 07/19/2017 19.9  7.0 - 26.0 mg/dL Final  . Creatinine 07/19/2017 1.1  0.7 - 1.3 mg/dL Final  . Total Bilirubin 07/19/2017 0.41  0.20 - 1.20 mg/dL Final  . Alkaline Phosphatase 07/19/2017 71  40 - 150 U/L Final  . AST 07/19/2017 26  5 - 34 U/L Final  . ALT 07/19/2017 24  0 - 55 U/L Final  . Total Protein 07/19/2017 7.0  6.4 - 8.3 g/dL Final  . Albumin 07/19/2017 3.5  3.5 - 5.0 g/dL Final  . Calcium 07/19/2017 9.4  8.4 - 10.4 mg/dL Final  . Anion Gap 07/19/2017 7  3 - 11 mEq/L Final  . EGFR 07/19/2017 >60  >60 ml/min/1.73 m2 Final   eGFR is calculated using the CKD-EPI Creatinine Equation (2009)  . Extra Tube 07/19/2017 Sample held in lab for 7 days   Final     STUDIES: Most recent myeloma panel obtained June 21, 2017 showed no monoclonal spike and negative IFE.  This was repeated today  ASSESSMENT: 66 y.o.  [place] man with a history of multiple myeloma diagnosed February of 2012, with an initial M-spike of 2.6 g/dL, IFE showing an IgG lambda paraprotein and free lambda light chains in the urine. Cytogenetics showed trisomy 65. Bone marrow biopsy showed a 22% plasmacytosis. Treated with   (1) bortezomib (subcutaneously), lenalidomide, and dexamethasone, with repeat bone marrow biopsy May of 2012 showing 10% plasmacytosis   (2) high-dose chemotherapy with BCNU and melphalan at [facility], followed by stem cell rescue July of 2012   (3) on zoledronic acid started December of 2012, initially monthly, currently given every 3 months, most recent dose  12/07/2015  (4) low-dose lenalidomide resumed April 2013, interrupted several times.  Resumed again on 02/19/2013, ata dose of 5 mg daily, 21 days on and 7 days off, later further reduced to 7 days on, 7 days off  (5) CNS symptoms and abnormal brain MRI extensively evaluated by neurology with no definitive diagnosis established  (6) rising M spike noted June 2014 but did not meet criteria for carfilzomib study  (7) on  lenalidomide 25 mg daily, 14 days on, 7 days off, starting 04/18/2013, interrupted December 2014 because of rash;   (a) resumed January 2015 at 10 mg/ day at 21 days on/ 7 days off  (Zakariya) starting 08/18/2014 decreased to 10 mg/ day 14 days on, 7 days off because of cytopenias  (c) as of February 2016 was on 5 mg daily 7 days on 7 days off  (d) lenalidomide discontinued December 2016 with evidence of disease progression  (8) transient global amnesia 05/29/2015, resolved without intervention  (9) starting PVD 10/25/2015 w ASA 325 thromboprophylaxis, valacyclovir prophylaxis, last dose 12/17/2015  (a) pomalidomide 4 mg/d days 1-14  ([place]) bortezomib sQ days 2,5,9,12 of each 21 day cycle  (c) dexamethasome 20 mg two days a week  (10) metapneumovirus pneumonia April 2017  (a) completing course of steroids and week of bactrim mid April 2017  (11) status post second autologous transplant at [facility] 02/04/2016(preparatory regimen melphalan 200 mg/m)  (a) received twelve-month vaccinations 03/14/2017 (DPT, Haemophilus, Pneumovax 13, polio)  (Pete) 14 months injections 05/04/2016 include DTaP, Hib conjugate, HepB energex Aqeel 20 mcg/ml, Prevnar 13  (c) 24 month vaccines due at [facility] June 2019  (12) maintenance therapy started November 2017, consisting of  (a) bortezomib 1.3 mg/M2 QOW, first dose 07/27/2016  (Dimas) pomalidomide 1 mg days  1-21 Q28 days, started 07/19/2016  (c) zolendronate monthly started 07/27/2016 (previously Q 3 months) however patient unable to tolerate, and changed back to q3 month in April, 2018   (d) Bortezomib changed to monthly as of June 2018 because of tolerance issues  PLAN:  Andre [name] is now a year and a half out from his second autologous stem cell transplant, with no evidence of disease progression.  This is very favorable.  He wanted to know if he will always be treated.  Certainly that is the plan at this point.  There are so many new treatments coming down the pike for myeloma however  some of which may prove curative that we can only guess as to what the future might bring.  He also wanted to know if we could decrease his treatments further.  We are already giving him the Velcade only monthly and the Pomalyst at the lowest dose that I have available.  I really do not want to lower the treatment any further particularly since he is doing so well overall  He will be going to [place] in January.  I am going to see him late December to set that up for him.  He will go ahead and get his Zometa today just to get that out of the way of the holidays.  He knows to call for any problems that may develop before the next visit here.   For reference, this is a summary of the 03/14/2017 note Dr.Gasparetto, who is helping as manage Andre [name]'s case: 1. MM labs: SPEP: M-spike: 0.19; FLC: kappa: 3.46, lambda: 2.13, ratio: 1.62; IFE: IgG Kappa and lambda light chain.  2. IgG Heavy Chain assasy: sent to [facility] clinic: pending 3. Continue Valtrex 500 twice daily.  4. Recommend maintenance Velcade 1.78m/m2 monthly, pomalidomide 271mD1-D21 based on tolerability.  5. Recommend Zometa q 61m32m 6. Pneumo-vax at 6 mths 7. 12 mth vaccinations administered today.  8. 14 mth vaccinations to be administered at home.  9. RTC in 12 mths or sooner if needed; 24 mth vaccinations to be administered at this time    Sherrin Stahle, GusVirgie DadD  07/19/17 10:12 AM Medical Oncology and Hematology [facility] [id number]. [phone] Fax. [phone] document serves as a record of services personally performed by GusChauncey CruelD. It was created on his behalf by JenMargit Banda trained medical scribe. The creation of this record is based on the scribe's personal observations and the provider's statements to them. This document has been checked and approved by the attending provider.

## 2017-07-18 ENCOUNTER — Other Ambulatory Visit: Payer: Self-pay

## 2017-07-18 DIAGNOSIS — C9000 Multiple myeloma not having achieved remission: Secondary | ICD-10-CM

## 2017-07-18 NOTE — Telephone Encounter (Signed)
[employer] auth#  [id number]

## 2017-07-19 ENCOUNTER — Telehealth: Payer: Self-pay | Admitting: Oncology

## 2017-07-19 ENCOUNTER — Other Ambulatory Visit (HOSPITAL_BASED_OUTPATIENT_CLINIC_OR_DEPARTMENT_OTHER): Payer: Commercial Managed Care - PPO

## 2017-07-19 ENCOUNTER — Ambulatory Visit (HOSPITAL_BASED_OUTPATIENT_CLINIC_OR_DEPARTMENT_OTHER): Payer: Commercial Managed Care - PPO | Admitting: Oncology

## 2017-07-19 ENCOUNTER — Ambulatory Visit (HOSPITAL_BASED_OUTPATIENT_CLINIC_OR_DEPARTMENT_OTHER): Payer: Commercial Managed Care - PPO

## 2017-07-19 VITALS — BP 117/81 | HR 62 | Temp 98.0°F | Resp 18 | Ht 69.0 in | Wt 187.2 lb

## 2017-07-19 DIAGNOSIS — Z9481 Bone marrow transplant status: Secondary | ICD-10-CM

## 2017-07-19 DIAGNOSIS — R9089 Other abnormal findings on diagnostic imaging of central nervous system: Secondary | ICD-10-CM

## 2017-07-19 DIAGNOSIS — C9000 Multiple myeloma not having achieved remission: Secondary | ICD-10-CM

## 2017-07-19 DIAGNOSIS — T451X5A Adverse effect of antineoplastic and immunosuppressive drugs, initial encounter: Secondary | ICD-10-CM

## 2017-07-19 DIAGNOSIS — C9002 Multiple myeloma in relapse: Secondary | ICD-10-CM

## 2017-07-19 DIAGNOSIS — D701 Agranulocytosis secondary to cancer chemotherapy: Secondary | ICD-10-CM

## 2017-07-19 DIAGNOSIS — Z5112 Encounter for antineoplastic immunotherapy: Secondary | ICD-10-CM | POA: Diagnosis not present

## 2017-07-19 LAB — COMPREHENSIVE METABOLIC PANEL
ALK PHOS: 71 U/L (ref 40–150)
ALT: 24 U/L (ref 0–55)
ANION GAP: 7 meq/L (ref 3–11)
AST: 26 U/L (ref 5–34)
Albumin: 3.5 g/dL (ref 3.5–5.0)
BILIRUBIN TOTAL: 0.41 mg/dL (ref 0.20–1.20)
BUN: 19.9 mg/dL (ref 7.0–26.0)
CO2: 22 meq/L (ref 22–29)
Calcium: 9.4 mg/dL (ref 8.4–10.4)
Chloride: 108 mEq/L (ref 98–109)
Creatinine: 1.1 mg/dL (ref 0.7–1.3)
EGFR: >60 mL/min/{1.73_m2} (ref >60)
Glucose: 98 mg/dl (ref 70–140)
POTASSIUM: 4.3 meq/L (ref 3.5–5.1)
SODIUM: 137 meq/L (ref 136–145)
Total Protein: 7 g/dL (ref 6.4–8.3)

## 2017-07-19 LAB — CBC WITH DIFFERENTIAL/PLATELET
BASO%: 1.1 % (ref 0.0–2.0)
BASOS ABS: 0 10*3/uL (ref 0.0–0.1)
EOS ABS: 0.2 10*3/uL (ref 0.0–0.5)
EOS%: 5.3 % (ref 0.0–7.0)
HEMATOCRIT: 37.5 % — AB (ref 38.4–49.9)
HEMOGLOBIN: 13.1 g/dL (ref 13.0–17.1)
LYMPH%: 33.6 % (ref 14.0–49.0)
MCH: 37.2 pg — AB (ref 27.2–33.4)
MCHC: 34.9 g/dL (ref 32.0–36.0)
MCV: 106.5 fL — AB (ref 79.3–98.0)
MONO#: 0.8 10*3/uL (ref 0.1–0.9)
MONO%: 20.1 % — AB (ref 0.0–14.0)
NEUT#: 1.5 10*3/uL (ref 1.5–6.5)
NEUT%: 39.9 % (ref 39.0–75.0)
Platelets: 110 10*3/uL — ABNORMAL LOW (ref 140–400)
RBC: 3.52 10*6/uL — ABNORMAL LOW (ref 4.20–5.82)
RDW: 14.4 % (ref 11.0–14.6)
WBC: 3.8 10*3/uL — ABNORMAL LOW (ref 4.0–10.3)
lymph#: 1.3 10*3/uL (ref 0.9–3.3)

## 2017-07-19 LAB — DRAW EXTRA CLOT TUBE

## 2017-07-19 MED ORDER — ZOLEDRONIC ACID 4 MG/100ML IV SOLN
4.0000 mg | Freq: Once | INTRAVENOUS | Status: AC
  Administered 2017-07-19: 4 mg via INTRAVENOUS
  Filled 2017-07-19: qty 100

## 2017-07-19 MED ORDER — PROCHLORPERAZINE MALEATE 10 MG PO TABS
10.0000 mg | ORAL_TABLET | Freq: Once | ORAL | Status: AC
  Administered 2017-07-19: 10 mg via ORAL

## 2017-07-19 MED ORDER — BORTEZOMIB CHEMO SQ INJECTION 3.5 MG (2.5MG/ML)
1.3000 mg/m2 | Freq: Once | INTRAMUSCULAR | Status: AC
  Administered 2017-07-19: 2.5 mg via SUBCUTANEOUS
  Filled 2017-07-19: qty 2.5

## 2017-07-19 MED ORDER — PROCHLORPERAZINE MALEATE 10 MG PO TABS
ORAL_TABLET | ORAL | Status: AC
  Filled 2017-07-19: qty 1

## 2017-07-19 NOTE — Patient Instructions (Addendum)
[facility] Discharge Instructions for Patients Receiving Chemotherapy  Today you received the following chemotherapy agents: Velcade and Zometa  To help prevent nausea and vomiting after your treatment, we encourage you to take your nausea medication as directed.   If you develop nausea and vomiting that is not controlled by your nausea medication, call the clinic.   BELOW ARE SYMPTOMS THAT SHOULD BE REPORTED IMMEDIATELY:  *FEVER GREATER THAN 100.5 F  *CHILLS WITH OR WITHOUT FEVER  NAUSEA AND VOMITING THAT IS NOT CONTROLLED WITH YOUR NAUSEA MEDICATION  *UNUSUAL SHORTNESS OF BREATH  *UNUSUAL BRUISING OR BLEEDING  TENDERNESS IN MOUTH AND THROAT WITH OR WITHOUT PRESENCE OF ULCERS  *URINARY PROBLEMS  *BOWEL PROBLEMS  UNUSUAL RASH Items with * indicate a potential emergency and should be followed up as soon as possible.  Feel free to call the clinic should you have any questions or concerns. The clinic phone number is [phone].  Please show the CHEMO ALERT CARD at check-in to the Emergency Department and triage nurse.   

## 2017-07-19 NOTE — Telephone Encounter (Signed)
Gave patient avs and calendar with appts per 11.1 los °

## 2017-07-20 ENCOUNTER — Other Ambulatory Visit: Payer: Self-pay | Admitting: *Deleted

## 2017-07-20 MED ORDER — POMALIDOMIDE 1 MG PO CAPS: ORAL_CAPSULE | ORAL | 0 refills | Status: DC

## 2017-07-23 LAB — MULTIPLE MYELOMA PANEL, SERUM
ALBUMIN SERPL ELPH-MCNC: 3.4 g/dL (ref 2.9–4.4)
ALBUMIN/GLOB SERPL: 1.1 (ref 0.7–1.7)
ALPHA 1: 0.2 g/dL (ref 0.0–0.4)
ALPHA2 GLOB SERPL ELPH-MCNC: 0.7 g/dL (ref 0.4–1.0)
B-Globulin SerPl Elph-Mcnc: 1 g/dL (ref 0.7–1.3)
GLOBULIN, TOTAL: 3.2 g/dL (ref 2.2–3.9)
Gamma Glob SerPl Elph-Mcnc: 1.3 g/dL (ref 0.4–1.8)
IGM (IMMUNOGLOBIN M), SRM: 37 mg/dL (ref 20–172)
IgA, Qn, Serum: 177 mg/dL (ref 61–437)
IgG, Qn, Serum: 1327 mg/dL (ref 700–1600)
TOTAL PROTEIN: 6.6 g/dL (ref 6.0–8.5)

## 2017-07-31 ENCOUNTER — Other Ambulatory Visit: Payer: Self-pay | Admitting: Oncology

## 2017-07-31 ENCOUNTER — Other Ambulatory Visit: Payer: Self-pay | Admitting: Internal Medicine

## 2017-08-02 NOTE — Telephone Encounter (Signed)
Will fill to local pharmacy for 30 day supply if patient would like. Not able to send to [facility] for 90 day supply.   Please ask pt if sending to local is okay.

## 2017-08-02 NOTE — Telephone Encounter (Signed)
Pt said he that he does not need these prescriptions refilled.

## 2017-08-15 NOTE — Progress Notes (Signed)
[place]  [phone] [phone]) [phone]    ID: Andre [name]  DOB: Jan 14, 1951  MR#: [id number] CSN#: [id number]   Patient Care Team: Andre Anger, MD as PCP - General (Internal Medicine) Magrinat, Andre Dad, MD as Consulting Physician (Oncology) Andre Living, MD as Consulting Physician (Neurology) Andre Lewandowsky, MD as Consulting Physician (Internal Medicine) Andre Ducking, MD as Consulting Physician (Neurology) Andre [name], DDS as Referring Physician (Oral Surgery)   CHIEF COMPLAINT: Multiple myeloma  CURRENT TREATMENT: Maintenance therapy with bortezomib, zolendronate, and pomalidomide  INTERVAL HISTORY:  Andre [name] returns today for follow-up and treatment of his multiple myeloma.  He continues on bortezomib every 2 weeks, which he tolerates well.  He does not complain of significant fatigue and has had no worsening issues regarding neuropathy  He is also on pomalidomide at 1 mg daily.  He does not like the medication.  He takes it at bedtime that makes it a little better.  Otherwise he tolerates it well  He receives zolendronate every 3 months, with his most recent dose 07/19/2017.  He has no side effects from that that he is aware of   REVIEW OF SYSTEMS: Andre [name] remains very stable overall.  He is exercising by walking, as much as 2-1/2 miles, although this is very irregular.  Mostly he and his wife can me walk on weekends.  He is working full-time although he admits to taking long lunches and it is hard for him to be there the full 8 hours.  He gets constipated, is now using a more active Colace which has a little bit of laxative and that is helping.  He had some itching in his legs last night but otherwise he normally sleeps well.  He complains of memory issues, but this is very stable.  A detailed review of systems today was otherwise noncontributory  HISTORY OF PRESENT ILLNESS:  From the original intake nodes:  The  patient was worked up for peptic ulcer disease in August of 2011, with significant bleeding and anemia.  The patient was Helicobacter pylori negative.  He received some epinephrine when he had his EGD and then started on Protonix.  [name] anemia slowly resolved so that by September, his hemoglobin was up to 10.9 and by earlier this month, his hemoglobin was up to 12.5.    As part of his general workup, he was found to have a slightly elevated globulin fraction.  In September, his total protein was 8.3 with an albumin of 3.8.  In January, the total protein was 8.4 and albumin 3.6.  With persistence of this slight abnormality, Dr. Olevia [name] obtained serum immunofixation and SPEP.  The SPTP showed an M-spike of 2.67 grams.  A total IgG was 4,190.  Total IgA low at 28.  Total IgM low at 28 also.  The immunofixation showed a monoclonal IgG lambda paraprotein.  There were also monoclonal free lambda light chains present. With this information, the patient was referred for further evaluation.   A diagnosis of myeloma was confirmed by bone marrow biopsy and subsequebnt treatment is as detailed below  PAST MEDICAL HISTORY: Past Medical History:  Diagnosis Date   Allergy    Anemia    Arthritis    Asthma    no treatment x 20 years   Depression    Double vision    occurs at times    Duodenal ulcer    GERD (gastroesophageal reflux disease)    Hyperlipidemia  Hypertension    Hypothyroidism    Multiple myeloma 07/04/2011   Thyroid disease   Significant for peptic ulcer disease as noted above.  History of hyperlipidemia.  History of anxiety and depression.  History of GERD which is significantly improved with weight loss and history of reactive airway disease, possibly secondary to the GERD which also improved with weight loss.  There was a history of obesity, now much improved secondary to exercise and diet.   PAST SURGICAL HISTORY: Past Surgical History:  Procedure Laterality Date     BONE MARROW TRANSPLANT  2011   for MM   CARDIOLITE STUDY  11/25/2003   NORMAL   TONSILLECTOMY      FAMILY HISTORY Family History  Problem Relation Age of Onset   Throat cancer Mother    Hypertension Father    Stroke Father    Asthma Father    Diabetes Father    Pancreatic cancer Brother   [name] father died at the age of 85 following a stroke.  [name] mother died with cancer of the throat at the age of 63.   She had a history of depression and was a smoker.  Not clear how much alcohol she drank, according to the patient.  The patient had two brothers.  One died from the age of 72 from a "kidney problem."  The second one died at the age of 39 from pancreatic cancer. (This may have been duodenal cancer. 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  His wife of 37+ years was Andre [name], but they are in process of divorce, to be finalized August 2018. In 2016 he remarried, his current wife's name is Andre [name].  The patient has two daughters:  Andre [name], who works in Andre [name], who had her second child April 2016. Both daughters live here in [place]. The patient is very close to his daughters.  Andre [name] first daughter, Andre [name], born 03/06/2013, apparently has Weber/Osler/Rendu. The patient is not a church attender.  Andre [name] also participates in [place] even though actually there is no alcohol or drug problem in the family.  He participates in this group and he gets quite a bit of support from it.     ADVANCED DIRECTIVES: In place  HEALTH MAINTENANCE: (updated 07/16/2013) Social History   Tobacco Use   Smoking status: Former Smoker    Years: 3.00    Last attempt to quit: 03/27/1969    Years since quitting: 48.4   Smokeless tobacco: Never Used  Substance Use Topics   Alcohol use: No   Drug use: No     Colonoscopy:  PSA:  Sept 2014, normal/Dr. [name]  Lipid panel: Sept 2014/Dr. [name]  Allergies  Allergen Reactions   Atorvastatin Other (See Comments)   Rosuvastatin     Other reaction(s): Liver Disorder   Crestor [Rosuvastatin Calcium]     ADVERSE EFFECTS ON LIVER   Lipitor [Atorvastatin Calcium]    Septra [Sulfamethoxazole-Trimethoprim] Rash   Current Outpatient Medications  Medication Sig Dispense Refill   aspirin 325 MG tablet Take 325 mg by mouth daily.     cetirizine (ZYRTEC) 10 MG tablet Take 10 mg by mouth daily.     docusate sodium (COLACE) 100 MG capsule Take 100 mg by mouth daily as needed for mild constipation or moderate constipation.     escitalopram (  LEXAPRO) 10 MG tablet TAKE 1 TABLET BY MOUTH  DAILY 90 tablet 1   feeding supplement, ENSURE ENLIVE, (ENSURE ENLIVE) LIQD Take 237 mLs by mouth 2 (two) times daily between meals. 237 mL 12   levothyroxine (SYNTHROID, LEVOTHROID) 150 MCG tablet TAKE 1 TABLET BY MOUTH  EVERY MORNING ON AN EMPTY  STOMACH 90 tablet 1   ondansetron (ZOFRAN) 8 MG tablet Take 1 tablet (8 mg total) by mouth every 8 (eight) hours as needed for nausea or vomiting. 20 tablet 0   pantoprazole (PROTONIX) 40 MG tablet TAKE 1 TABLET BY MOUTH  TWICE A DAY BEFORE MEALS 180 tablet 3   pomalidomide (POMALYST) 1 MG capsule TAKE 1 CAPSULE BY MOUTH EVERY DAY FOR 21 DAYS, THEN 7 DAYS OFF 21 capsule 0   ranitidine (ZANTAC) 150 MG tablet Take 150 mg by mouth 2 (two) times daily.     sildenafil (VIAGRA) 25 MG tablet Take 1 tablet (25 mg total) by mouth daily as needed for erectile dysfunction. 10 tablet 0   triamcinolone cream (KENALOG) 0.5 % Apply 1 application topically 3 (three) times daily. 45 g 2   valACYclovir (VALTREX) 1000 MG tablet TAKE 1 TABLET BY MOUTH  DAILY 90 tablet 3   zolendronic acid (ZOMETA) 4 MG/5ML injection Inject 4 mg into the vein every 3 (three) months.     No current facility-administered medications for this  visit.    Objective: Middle-aged white man who appears stated age  21:   08/16/17 0842  BP: 117/72  Pulse: 60  Resp: 18  Temp: 98.3 F (36.8 C)  SpO2: 100%  Body mass index is 28.25 kg/m. Filed Weights   08/16/17 0842  Weight: 191 lb 4.8 oz (86.8 kg)   Sclerae unicteric, pupils round and equal Oropharynx clear and moist No cervical or supraclavicular adenopathy Lungs no rales or rhonchi Heart regular rate and rhythm Abd soft, nontender, positive bowel sounds MSK no focal spinal tenderness, no upper extremity lymphedema Neuro: nonfocal, well oriented, appropriate affect     Appointment on 08/16/2017  Component Date Value Ref Range Status   Extra Tube 08/16/2017 Sample held in lab for 7 days   Final   Sodium 08/16/2017 138  136 - 145 mEq/L Final   Potassium 08/16/2017 4.1  3.5 - 5.1 mEq/L Final   Chloride 08/16/2017 109  98 - 109 mEq/L Final   CO2 08/16/2017 22  22 - 29 mEq/L Final   Glucose 08/16/2017 87  70 - 140 mg/dl Final   Glucose reference range is for nonfasting patients. Fasting glucose reference range is 70- 100.   BUN 08/16/2017 15.1  7.0 - 26.0 mg/dL Final   Creatinine 08/16/2017 1.1  0.7 - 1.3 mg/dL Final   Total Bilirubin 08/16/2017 0.31  0.20 - 1.20 mg/dL Final   Alkaline Phosphatase 08/16/2017 81  40 - 150 U/L Final   AST 08/16/2017 19  5 - 34 U/L Final   ALT 08/16/2017 18  0 - 55 U/L Final   Total Protein 08/16/2017 7.4  6.4 - 8.3 g/dL Final   Albumin 08/16/2017 3.3* 3.5 - 5.0 g/dL Final   Calcium 08/16/2017 9.3  8.4 - 10.4 mg/dL Final   Anion Gap 08/16/2017 7  3 - 11 mEq/L Final   EGFR 08/16/2017 >60  >60 ml/min/1.73 m2 Final   eGFR is calculated using the CKD-EPI Creatinine Equation (2009)   WBC 08/16/2017 6.3  4.0 - 10.3 10e3/uL Final   NEUT# 08/16/2017 3.6  1.5 - 6.5 10e3/uL  Final   HGB 08/16/2017 12.6* 13.0 - 17.1 g/dL Final   HCT 08/16/2017 36.2* 38.4 - 49.9 % Final   Platelets 08/16/2017 141  140 - 400 10e3/uL  Final   MCV 08/16/2017 105.8* 79.3 - 98.0 fL Final   MCH 08/16/2017 36.8* 27.2 - 33.4 pg Final   MCHC 08/16/2017 34.8  32.0 - 36.0 g/dL Final   RBC 08/16/2017 3.42* 4.20 - 5.82 10e6/uL Final   RDW 08/16/2017 14.0  11.0 - 14.6 % Final   lymph# 08/16/2017 1.5  0.9 - 3.3 10e3/uL Final   MONO# 08/16/2017 1.0* 0.1 - 0.9 10e3/uL Final   Eosinophils Absolute 08/16/2017 0.2  0.0 - 0.5 10e3/uL Final   Basophils Absolute 08/16/2017 0.0  0.0 - 0.1 10e3/uL Final   NEUT% 08/16/2017 56.8  39.0 - 75.0 % Final   LYMPH% 08/16/2017 23.9  14.0 - 49.0 % Final   MONO% 08/16/2017 15.6* 0.0 - 14.0 % Final   EOS% 08/16/2017 3.2  0.0 - 7.0 % Final   BASO% 08/16/2017 0.5  0.0 - 2.0 % Final   nRBC 08/16/2017 0  0 - 0 % Final     STUDIES: He continues in remission, with no observed M spike.  We are repeating the kappa and lambda light chains today  ASSESSMENT: 66 y.o. [place] man with a history of multiple myeloma diagnosed February of 2012, with an initial M-spike of 2.6 g/dL, IFE showing an IgG lambda paraprotein and free lambda light chains in the urine. Cytogenetics showed trisomy 47. Bone marrow biopsy showed a 22% plasmacytosis. Treated with   (1) bortezomib (subcutaneously), lenalidomide, and dexamethasone, with repeat bone marrow biopsy May of 2012 showing 10% plasmacytosis   (2) high-dose chemotherapy with BCNU and melphalan at [facility], followed by stem cell rescue July of 2012   (3) on zoledronic acid started December of 2012, initially monthly, currently given every 3 months, most recent dose  12/07/2015  (4) low-dose lenalidomide resumed April 2013, interrupted several times.  Resumed again on 02/19/2013, ata dose of 5 mg daily, 21 days on and 7 days off, later further reduced to 7 days on, 7 days off  (5) CNS symptoms and abnormal brain MRI extensively evaluated by neurology with no definitive diagnosis established  (6) rising M spike noted June 2014 but did not meet criteria for  carfilzomib study  (7) on lenalidomide 25 mg daily, 14 days on, 7 days off, starting 04/18/2013, interrupted December 2014 because of rash;   (a) resumed January 2015 at 10 mg/ day at 21 days on/ 7 days off  (Talal) starting 08/18/2014 decreased to 10 mg/ day 14 days on, 7 days off because of cytopenias  (c) as of February 2016 was on 5 mg daily 7 days on 7 days off  (d) lenalidomide discontinued December 2016 with evidence of disease progression  (8) transient global amnesia 05/29/2015, resolved without intervention  (9) starting PVD 10/25/2015 w ASA 325 thromboprophylaxis, valacyclovir prophylaxis, last dose 12/17/2015  (a) pomalidomide 4 mg/d days 1-14  (Aram) bortezomib sQ days 2,5,9,12 of each 21 day cycle  (c) dexamethasome 20 mg two days a week  (10) metapneumovirus pneumonia April 2017  (a) completing course of steroids and week of bactrim mid April 2017  (11) status post second autologous transplant at [facility] 02/04/2016(preparatory regimen melphalan 200 mg/m)  (a) received twelve-month vaccinations 03/14/2017 (DPT, Haemophilus, Pneumovax 13, polio)  (Josh) 14 months injections 05/04/2016 include DTaP, Hib conjugate, HepB energex Kellis 20 mcg/ml, Prevnar 13  (c) 24  month vaccines due at [facility] June 2019  (12) maintenance therapy started November 2017, consisting of  (a) bortezomib 1.3 mg/M2 QOW, first dose 07/27/2016  (Victorio) pomalidomide 1 mg days 1-21 Q28 days, started 07/19/2016  (c) zolendronate monthly started 07/27/2016 (previously Q 3 months) however patient unable to tolerate, and changed back to q3 month in April, 2018   (d) Bortezomib changed to monthly as of June 2018 because of tolerance issues  PLAN:  Andre [name] is a year and a half out from his second autologous transplant, with no evidence of disease progression.  This is very favorable.  He continues to tolerate the current regimen well.  He will receive Velcade today and every 14days together with pomalidomide nightly.  He also receives  zolendronate, with his next dose due October 11, 2016.  I will see him on that day  He will see Dr. Alvie Heidelberg again sometime in the summer  Andre [name] will call with any issues that may develop before his next   Magrinat, Andre Dad, MD  08/16/17 9:00 AM Medical Oncology and Hematology [facility] [place], [place] [address] Tel. [phone]    Fax. [phone]  This document serves as a record of services personally performed by Chauncey Cruel, MD. It was created on his behalf by Margit Banda, a trained medical scribe. The creation of this record is based on the scribe's personal observations and the provider's statements to them.   I have reviewed the above documentation for accuracy and completeness, and I agree with the above.

## 2017-08-16 ENCOUNTER — Other Ambulatory Visit (HOSPITAL_BASED_OUTPATIENT_CLINIC_OR_DEPARTMENT_OTHER): Payer: Commercial Managed Care - PPO

## 2017-08-16 ENCOUNTER — Telehealth: Payer: Self-pay | Admitting: Oncology

## 2017-08-16 ENCOUNTER — Ambulatory Visit (HOSPITAL_BASED_OUTPATIENT_CLINIC_OR_DEPARTMENT_OTHER): Payer: Commercial Managed Care - PPO

## 2017-08-16 ENCOUNTER — Ambulatory Visit (HOSPITAL_BASED_OUTPATIENT_CLINIC_OR_DEPARTMENT_OTHER): Payer: Commercial Managed Care - PPO | Admitting: Oncology

## 2017-08-16 VITALS — BP 117/72 | HR 60 | Temp 98.3°F | Resp 18 | Ht 69.0 in | Wt 191.3 lb

## 2017-08-16 DIAGNOSIS — Z9481 Bone marrow transplant status: Secondary | ICD-10-CM

## 2017-08-16 DIAGNOSIS — C9001 Multiple myeloma in remission: Secondary | ICD-10-CM

## 2017-08-16 DIAGNOSIS — T451X5A Adverse effect of antineoplastic and immunosuppressive drugs, initial encounter: Secondary | ICD-10-CM

## 2017-08-16 DIAGNOSIS — D701 Agranulocytosis secondary to cancer chemotherapy: Secondary | ICD-10-CM

## 2017-08-16 DIAGNOSIS — C9000 Multiple myeloma not having achieved remission: Secondary | ICD-10-CM

## 2017-08-16 DIAGNOSIS — Z5112 Encounter for antineoplastic immunotherapy: Secondary | ICD-10-CM | POA: Diagnosis not present

## 2017-08-16 DIAGNOSIS — R9089 Other abnormal findings on diagnostic imaging of central nervous system: Secondary | ICD-10-CM

## 2017-08-16 LAB — CBC WITH DIFFERENTIAL/PLATELET
BASO%: 0.5 % (ref 0.0–2.0)
BASOS ABS: 0 10*3/uL (ref 0.0–0.1)
EOS ABS: 0.2 10*3/uL (ref 0.0–0.5)
EOS%: 3.2 % (ref 0.0–7.0)
HEMATOCRIT: 36.2 % — AB (ref 38.4–49.9)
HEMOGLOBIN: 12.6 g/dL — AB (ref 13.0–17.1)
LYMPH#: 1.5 10*3/uL (ref 0.9–3.3)
LYMPH%: 23.9 % (ref 14.0–49.0)
MCH: 36.8 pg — AB (ref 27.2–33.4)
MCHC: 34.8 g/dL (ref 32.0–36.0)
MCV: 105.8 fL — ABNORMAL HIGH (ref 79.3–98.0)
MONO#: 1 10*3/uL — AB (ref 0.1–0.9)
MONO%: 15.6 % — ABNORMAL HIGH (ref 0.0–14.0)
NEUT#: 3.6 10*3/uL (ref 1.5–6.5)
NEUT%: 56.8 % (ref 39.0–75.0)
PLATELETS: 141 10*3/uL (ref 140–400)
RBC: 3.42 10*6/uL — ABNORMAL LOW (ref 4.20–5.82)
RDW: 14 % (ref 11.0–14.6)
WBC: 6.3 10*3/uL (ref 4.0–10.3)
nRBC: 0 % (ref 0–0)

## 2017-08-16 LAB — COMPREHENSIVE METABOLIC PANEL
ALT: 18 U/L (ref 0–55)
ANION GAP: 7 meq/L (ref 3–11)
AST: 19 U/L (ref 5–34)
Albumin: 3.3 g/dL — ABNORMAL LOW (ref 3.5–5.0)
Alkaline Phosphatase: 81 U/L (ref 40–150)
BUN: 15.1 mg/dL (ref 7.0–26.0)
CALCIUM: 9.3 mg/dL (ref 8.4–10.4)
CHLORIDE: 109 meq/L (ref 98–109)
CO2: 22 mEq/L (ref 22–29)
Creatinine: 1.1 mg/dL (ref 0.7–1.3)
EGFR: >60 mL/min/{1.73_m2} (ref >60)
Glucose: 87 mg/dl (ref 70–140)
POTASSIUM: 4.1 meq/L (ref 3.5–5.1)
Sodium: 138 mEq/L (ref 136–145)
Total Bilirubin: 0.31 mg/dL (ref 0.20–1.20)
Total Protein: 7.4 g/dL (ref 6.4–8.3)

## 2017-08-16 LAB — DRAW EXTRA CLOT TUBE

## 2017-08-16 MED ORDER — PROCHLORPERAZINE MALEATE 10 MG PO TABS
ORAL_TABLET | ORAL | Status: AC
  Filled 2017-08-16: qty 1

## 2017-08-16 MED ORDER — BORTEZOMIB CHEMO SQ INJECTION 3.5 MG (2.5MG/ML)
1.3000 mg/m2 | Freq: Once | INTRAMUSCULAR | Status: AC
  Administered 2017-08-16: 2.5 mg via SUBCUTANEOUS
  Filled 2017-08-16: qty 2.5

## 2017-08-16 MED ORDER — PROCHLORPERAZINE MALEATE 10 MG PO TABS
10.0000 mg | ORAL_TABLET | Freq: Once | ORAL | Status: AC
  Administered 2017-08-16: 10 mg via ORAL

## 2017-08-16 NOTE — Patient Instructions (Signed)
[facility] Discharge Instructions for Patients Receiving Chemotherapy  Today you received the following chemotherapy agents Velcade To help prevent nausea and vomiting after your treatment, we encourage you to take your nausea medication as prescribed.   If you develop nausea and vomiting that is not controlled by your nausea medication, call the clinic.   BELOW ARE SYMPTOMS THAT SHOULD BE REPORTED IMMEDIATELY:  *FEVER GREATER THAN 100.5 F  *CHILLS WITH OR WITHOUT FEVER  NAUSEA AND VOMITING THAT IS NOT CONTROLLED WITH YOUR NAUSEA MEDICATION  *UNUSUAL SHORTNESS OF BREATH  *UNUSUAL BRUISING OR BLEEDING  TENDERNESS IN MOUTH AND THROAT WITH OR WITHOUT PRESENCE OF ULCERS  *URINARY PROBLEMS  *BOWEL PROBLEMS  UNUSUAL RASH Items with * indicate a potential emergency and should be followed up as soon as possible.  Feel free to call the clinic should you have any questions or concerns. The clinic phone number is [phone].  Please show the CHEMO ALERT CARD at check-in to the Emergency Department and triage nurse.   

## 2017-08-16 NOTE — Telephone Encounter (Signed)
Gave patient avs with appts per 11/29 los.

## 2017-08-17 ENCOUNTER — Ambulatory Visit (INDEPENDENT_AMBULATORY_CARE_PROVIDER_SITE_OTHER): Payer: Commercial Managed Care - PPO | Admitting: Internal Medicine

## 2017-08-17 ENCOUNTER — Encounter: Payer: Self-pay | Admitting: Internal Medicine

## 2017-08-17 DIAGNOSIS — Z0000 Encounter for general adult medical examination without abnormal findings: Secondary | ICD-10-CM

## 2017-08-17 LAB — KAPPA/LAMBDA LIGHT CHAINS

## 2017-08-17 MED ORDER — B COMPLEX PO TABS: 1.0000 | ORAL_TABLET | Freq: Every day | ORAL | 3 refills | Status: DC

## 2017-08-17 MED ORDER — VITAMIN D3 50 MCG (2000 UT) PO CAPS: 2000.0000 [IU] | ORAL_CAPSULE | Freq: Every day | ORAL | 3 refills | Status: DC

## 2017-08-17 MED ORDER — TRIAMCINOLONE ACETONIDE 0.5 % EX CREA: 1.0000 "application " | TOPICAL_CREAM | Freq: Four times a day (QID) | CUTANEOUS | 2 refills | Status: DC

## 2017-08-17 MED ORDER — ASPIRIN EC 81 MG PO TBEC: 81.0000 mg | DELAYED_RELEASE_TABLET | Freq: Every day | ORAL | 3 refills | Status: AC

## 2017-08-17 NOTE — Assessment & Plan Note (Signed)
We discussed age appropriate health related issues, including available/recomended screening tests and vaccinations. We discussed a need for adhering to healthy diet and exercise. Labs were ordered to be later reviewed . All questions were answered.   

## 2017-08-17 NOTE — Progress Notes (Signed)
Subjective:  Patient ID: Andre [name], male    DOB: [birth year]  Age: 66 y.o. MRN: [id number]  CC: No chief complaint on file.   HPI Andre [name] presents for a well exam  Outpatient Medications Prior to Visit  Medication Sig Dispense Refill  . aspirin 325 MG tablet Take 325 mg by mouth daily.    . cetirizine (ZYRTEC) 10 MG tablet Take 10 mg by mouth daily.    [name] docusate sodium (COLACE) 100 MG capsule Take 100 mg by mouth daily as needed for mild constipation or moderate constipation.    [name] escitalopram (LEXAPRO) 10 MG tablet TAKE 1 TABLET BY MOUTH  DAILY 90 tablet 1  . feeding supplement, ENSURE ENLIVE, (ENSURE ENLIVE) LIQD Take 237 mLs by mouth 2 (two) times daily between meals. 237 mL 12  . levothyroxine (SYNTHROID, LEVOTHROID) 150 MCG tablet TAKE 1 TABLET BY MOUTH  EVERY MORNING ON AN EMPTY  STOMACH 90 tablet 1  . ondansetron (ZOFRAN) 8 MG tablet Take 1 tablet (8 mg total) by mouth every 8 (eight) hours as needed for nausea or vomiting. 20 tablet 0  . pantoprazole (PROTONIX) 40 MG tablet TAKE 1 TABLET BY MOUTH  TWICE A DAY BEFORE MEALS 180 tablet 3  . pomalidomide (POMALYST) 1 MG capsule TAKE 1 CAPSULE BY MOUTH EVERY DAY FOR 21 DAYS, THEN 7 DAYS OFF 21 capsule 0  . ranitidine (ZANTAC) 150 MG tablet Take 150 mg by mouth 2 (two) times daily.    . sildenafil (VIAGRA) 25 MG tablet Take 1 tablet (25 mg total) by mouth daily as needed for erectile dysfunction. 10 tablet 0  . valACYclovir (VALTREX) 1000 MG tablet TAKE 1 TABLET BY MOUTH  DAILY 90 tablet 3  . zolendronic acid (ZOMETA) 4 MG/5ML injection Inject 4 mg into the vein every 3 (three) months.     No facility-administered medications prior to visit.     ROS Review of Systems  Constitutional: Positive for fatigue. Negative for appetite change and unexpected weight change.  HENT: Negative for congestion, nosebleeds, sneezing, sore throat and trouble swallowing.   Eyes: Negative for itching and visual disturbance.    Respiratory: Negative for cough.   Cardiovascular: Negative for chest pain, palpitations and leg swelling.  Gastrointestinal: Negative for abdominal distention, blood in stool, diarrhea and nausea.  Genitourinary: Negative for frequency and hematuria.  Musculoskeletal: Positive for arthralgias, back pain and neck pain. Negative for gait problem and joint swelling.  Skin: Negative for rash.  Neurological: Negative for dizziness, tremors, speech difficulty and weakness.  Hematological: Does not bruise/bleed easily.  Psychiatric/Behavioral: Negative for agitation, decreased concentration, dysphoric mood, sleep disturbance and suicidal ideas. The patient is not nervous/anxious.     Objective:  BP 126/82 (BP Location: Left Arm, Patient Position: Sitting, Cuff Size: Normal)   Pulse 65   Temp 98.5 F (36.9 C) (Oral)   Ht 5\' 9"  (1.753 m)   Wt 189 lb (85.7 kg)   SpO2 98%   BMI 27.91 kg/m   BP Readings from Last 3 Encounters:  08/17/17 126/82  08/16/17 117/72  07/19/17 117/81    Wt Readings from Last 3 Encounters:  08/17/17 189 lb (85.7 kg)  08/16/17 191 lb 4.8 oz (86.8 kg)  07/19/17 187 lb 3.2 oz (84.9 kg)    Physical Exam  Constitutional: He is oriented to person, place, and time. He appears well-developed. No distress.  NAD  HENT:  Mouth/Throat: Oropharynx is clear and moist.  Eyes: Conjunctivae are normal. Pupils  are equal, round, and reactive to light.  Neck: Normal range of motion. No JVD present. No thyromegaly present.  Cardiovascular: Normal rate, regular rhythm, normal heart sounds and intact distal pulses. Exam reveals no gallop and no friction rub.  No murmur heard. Pulmonary/Chest: Effort normal and breath sounds normal. No respiratory distress. He has no wheezes. He has no rales. He exhibits no tenderness.  Abdominal: Soft. Bowel sounds are normal. He exhibits no distension and no mass. There is no tenderness. There is no rebound and no guarding.  Musculoskeletal:  Normal range of motion. He exhibits tenderness. He exhibits no edema.  Lymphadenopathy:    He has no cervical adenopathy.  Neurological: He is alert and oriented to person, place, and time. He has normal reflexes. No cranial nerve deficit. He exhibits normal muscle tone. He displays a negative Romberg sign. Coordination and gait normal.  Skin: Skin is warm and dry. No rash noted.  Psychiatric: He has a normal mood and affect. His behavior is normal. Judgment and thought content normal.  erythema of the inj site - abd Pt declined rectal exam  Lab Results  Component Value Date   WBC 6.3 08/16/2017   HGB 12.6 (L) 08/16/2017   HCT 36.2 (L) 08/16/2017   PLT 141 08/16/2017   GLUCOSE 87 08/16/2017   CHOL 197 08/16/2016   TRIG 155.0 (H) 08/16/2016   HDL 39.60 08/16/2016   LDLDIRECT 155.8 11/13/2012   LDLCALC 127 (H) 08/16/2016   ALT 18 08/16/2017   AST 19 08/16/2017   NA 138 08/16/2017   K 4.1 08/16/2017   CL 105 02/28/2016   CREATININE 1.1 08/16/2017   BUN 15.1 08/16/2017   CO2 22 08/16/2017   TSH 0.37 08/16/2016   PSA 0.64 08/16/2016   INR 1.07 02/27/2016   HGBA1C 5.4 03/27/2012    Dg Chest 2 View  Result Date: 11/20/2016 CLINICAL DATA:  Nausea, fatigue, cough and fever. EXAM: CHEST  2 VIEW COMPARISON:  02/26/2016. FINDINGS: Trachea is midline. Heart size normal. Lungs are clear. No pleural fluid. IMPRESSION: No acute findings. Electronically Signed   By: Lorin Picket M.D.   On: 11/20/2016 16:09    Assessment & Plan:   There are no diagnoses linked to this encounter. I am having Andre Canny "[name]" maintain his cetirizine, aspirin, zolendronic acid, ranitidine, docusate sodium, feeding supplement (ENSURE ENLIVE), sildenafil, ondansetron, escitalopram, levothyroxine, pantoprazole, pomalidomide, and valACYclovir.  No orders of the defined types were placed in this encounter.    Follow-up: No Follow-up on file.  [name], MD

## 2017-08-17 NOTE — Patient Instructions (Signed)
Shingrix vaccine

## 2017-08-20 ENCOUNTER — Other Ambulatory Visit: Payer: Self-pay | Admitting: Oncology

## 2017-08-20 ENCOUNTER — Other Ambulatory Visit: Payer: Self-pay | Admitting: *Deleted

## 2017-08-20 DIAGNOSIS — Z9481 Bone marrow transplant status: Secondary | ICD-10-CM

## 2017-08-20 DIAGNOSIS — D701 Agranulocytosis secondary to cancer chemotherapy: Secondary | ICD-10-CM

## 2017-08-20 DIAGNOSIS — C9000 Multiple myeloma not having achieved remission: Secondary | ICD-10-CM

## 2017-08-20 DIAGNOSIS — T451X5A Adverse effect of antineoplastic and immunosuppressive drugs, initial encounter: Secondary | ICD-10-CM

## 2017-08-20 LAB — MULTIPLE MYELOMA PANEL, SERUM
ALBUMIN SERPL ELPH-MCNC: 3.2 g/dL (ref 2.9–4.4)
Albumin/Glob SerPl: 1 (ref 0.7–1.7)
Alpha 1: 0.2 g/dL (ref 0.0–0.4)
Alpha2 Glob SerPl Elph-Mcnc: 0.8 g/dL (ref 0.4–1.0)
B-GLOBULIN SERPL ELPH-MCNC: 1.1 g/dL (ref 0.7–1.3)
GAMMA GLOB SERPL ELPH-MCNC: 1.4 g/dL (ref 0.4–1.8)
GLOBULIN, TOTAL: 3.5 g/dL (ref 2.2–3.9)
IgA, Qn, Serum: 202 mg/dL (ref 61–437)
IgG, Qn, Serum: 1428 mg/dL (ref 700–1600)
IgM, Qn, Serum: 72 mg/dL (ref 20–172)
M PROTEIN SERPL ELPH-MCNC: 0.2 g/dL — AB
Total Protein: 6.7 g/dL (ref 6.0–8.5)

## 2017-08-20 LAB — KAPPA/LAMBDA LIGHT CHAINS

## 2017-08-21 ENCOUNTER — Other Ambulatory Visit: Payer: Self-pay | Admitting: Oncology

## 2017-08-27 ENCOUNTER — Other Ambulatory Visit: Payer: Self-pay | Admitting: Oncology

## 2017-08-27 DIAGNOSIS — C9000 Multiple myeloma not having achieved remission: Secondary | ICD-10-CM

## 2017-08-27 NOTE — Progress Notes (Unsigned)
Andre [name] has had a mild bump in his M spike.  I am going to confirm this with his next set of labs, 09/14/2017.  If this does show a recurrence/progression we will have to intensify his treatment.

## 2017-09-06 ENCOUNTER — Other Ambulatory Visit (INDEPENDENT_AMBULATORY_CARE_PROVIDER_SITE_OTHER): Payer: Commercial Managed Care - PPO

## 2017-09-06 DIAGNOSIS — C9000 Multiple myeloma not having achieved remission: Secondary | ICD-10-CM

## 2017-09-06 DIAGNOSIS — Z0000 Encounter for general adult medical examination without abnormal findings: Secondary | ICD-10-CM

## 2017-09-06 DIAGNOSIS — Z9481 Bone marrow transplant status: Secondary | ICD-10-CM

## 2017-09-06 LAB — BASIC METABOLIC PANEL
BUN: 17 mg/dL (ref 6–23)
CALCIUM: 9.7 mg/dL (ref 8.4–10.5)
CO2: 23 meq/L (ref 19–32)
CREATININE: 1.17 mg/dL (ref 0.40–1.50)
Chloride: 108 mEq/L (ref 96–112)
GFR: 65.84 mL/min (ref >60.00)
Glucose, Bld: 71 mg/dL (ref 70–99)
Potassium: 5.5 mEq/L — ABNORMAL HIGH (ref 3.5–5.1)
SODIUM: 142 meq/L (ref 135–145)

## 2017-09-06 LAB — CBC WITH DIFFERENTIAL/PLATELET
BASOS PCT: 1.2 % (ref 0.0–3.0)
Basophils Absolute: 0.1 10*3/uL (ref 0.0–0.1)
EOS ABS: 0.1 10*3/uL (ref 0.0–0.7)
Eosinophils Relative: 2 % (ref 0.0–5.0)
HEMATOCRIT: 40.2 % (ref 39.0–52.0)
HEMOGLOBIN: 13.5 g/dL (ref 13.0–17.0)
LYMPHS PCT: 35.6 % (ref 12.0–46.0)
Lymphs Abs: 2 10*3/uL (ref 0.7–4.0)
MCHC: 33.6 g/dL (ref 30.0–36.0)
MONOS PCT: 16.9 % — AB (ref 3.0–12.0)
Monocytes Absolute: 1 10*3/uL (ref 0.1–1.0)
Neutro Abs: 2.5 10*3/uL (ref 1.4–7.7)
Neutrophils Relative %: 44.3 % (ref 43.0–77.0)
Platelets: 147 10*3/uL — ABNORMAL LOW (ref 150.0–400.0)
RBC: 3.65 Mil/uL — ABNORMAL LOW (ref 4.22–5.81)
RDW: 14.3 % (ref 11.5–15.5)
WBC: 5.7 10*3/uL (ref 4.0–10.5)

## 2017-09-06 LAB — URINALYSIS
Bilirubin Urine: NEGATIVE
Hgb urine dipstick: NEGATIVE
Ketones, ur: NEGATIVE
LEUKOCYTES UA: NEGATIVE
Nitrite: NEGATIVE
SPECIFIC GRAVITY, URINE: 1.02 (ref 1.000–1.030)
Total Protein, Urine: NEGATIVE
URINE GLUCOSE: NEGATIVE
UROBILINOGEN UA: 0.2 (ref 0.0–1.0)
pH: 5.5 (ref 5.0–8.0)

## 2017-09-06 LAB — VITAMIN D 25 HYDROXY (VIT D DEFICIENCY, FRACTURES): VITD: 19.54 ng/mL — AB (ref 30.00–100.00)

## 2017-09-06 LAB — HEPATIC FUNCTION PANEL
ALBUMIN: 4.1 g/dL (ref 3.5–5.2)
ALK PHOS: 80 U/L (ref 39–117)
ALT: 19 U/L (ref 0–53)
AST: 23 U/L (ref 0–37)
Bilirubin, Direct: 0.1 mg/dL (ref 0.0–0.3)
TOTAL PROTEIN: 7.7 g/dL (ref 6.0–8.3)
Total Bilirubin: 0.5 mg/dL (ref 0.2–1.2)

## 2017-09-06 LAB — LIPID PANEL
CHOLESTEROL: 164 mg/dL (ref 0–200)
HDL: 38.6 mg/dL — ABNORMAL LOW (ref >39.00)
LDL CALC: 86 mg/dL (ref 0–99)
NonHDL: 125.22
Total CHOL/HDL Ratio: 4
Triglycerides: 194 mg/dL — ABNORMAL HIGH (ref 0.0–149.0)
VLDL: 38.8 mg/dL (ref 0.0–40.0)

## 2017-09-06 LAB — TSH: TSH: 0.05 u[IU]/mL — AB (ref 0.35–4.50)

## 2017-09-06 LAB — PSA: PSA: 1.76 ng/mL (ref 0.10–4.00)

## 2017-09-07 ENCOUNTER — Other Ambulatory Visit: Payer: Self-pay | Admitting: Internal Medicine

## 2017-09-07 DIAGNOSIS — R531 Weakness: Secondary | ICD-10-CM

## 2017-09-07 MED ORDER — VITAMIN D3 1.25 MG (50000 UT) PO CAPS: 1.0000 | ORAL_CAPSULE | ORAL | 0 refills | Status: DC

## 2017-09-14 ENCOUNTER — Ambulatory Visit (HOSPITAL_BASED_OUTPATIENT_CLINIC_OR_DEPARTMENT_OTHER): Payer: Commercial Managed Care - PPO | Admitting: Adult Health

## 2017-09-14 ENCOUNTER — Other Ambulatory Visit (HOSPITAL_BASED_OUTPATIENT_CLINIC_OR_DEPARTMENT_OTHER): Payer: Commercial Managed Care - PPO

## 2017-09-14 ENCOUNTER — Ambulatory Visit (HOSPITAL_BASED_OUTPATIENT_CLINIC_OR_DEPARTMENT_OTHER): Payer: Commercial Managed Care - PPO

## 2017-09-14 ENCOUNTER — Telehealth: Payer: Self-pay | Admitting: Adult Health

## 2017-09-14 ENCOUNTER — Encounter: Payer: Self-pay | Admitting: Adult Health

## 2017-09-14 VITALS — BP 126/80 | HR 75 | Temp 98.4°F | Resp 17 | Ht 69.0 in | Wt 184.2 lb

## 2017-09-14 DIAGNOSIS — C9000 Multiple myeloma not having achieved remission: Secondary | ICD-10-CM

## 2017-09-14 DIAGNOSIS — Z5112 Encounter for antineoplastic immunotherapy: Secondary | ICD-10-CM

## 2017-09-14 DIAGNOSIS — Z9481 Bone marrow transplant status: Secondary | ICD-10-CM

## 2017-09-14 LAB — CBC WITH DIFFERENTIAL/PLATELET
BASO%: 1.1 % (ref 0.0–2.0)
BASOS ABS: 0.1 10*3/uL (ref 0.0–0.1)
EOS%: 4.4 % (ref 0.0–7.0)
Eosinophils Absolute: 0.2 10*3/uL (ref 0.0–0.5)
HEMATOCRIT: 40.6 % (ref 38.4–49.9)
HEMOGLOBIN: 13.9 g/dL (ref 13.0–17.1)
LYMPH#: 1.2 10*3/uL (ref 0.9–3.3)
LYMPH%: 25.3 % (ref 14.0–49.0)
MCH: 36.7 pg — AB (ref 27.2–33.4)
MCHC: 34.2 g/dL (ref 32.0–36.0)
MCV: 107.5 fL — ABNORMAL HIGH (ref 79.3–98.0)
MONO#: 0.8 10*3/uL (ref 0.1–0.9)
MONO%: 16.6 % — ABNORMAL HIGH (ref 0.0–14.0)
NEUT#: 2.5 10*3/uL (ref 1.5–6.5)
NEUT%: 52.6 % (ref 39.0–75.0)
PLATELETS: 170 10*3/uL (ref 140–400)
RBC: 3.78 10*6/uL — ABNORMAL LOW (ref 4.20–5.82)
RDW: 14.9 % — ABNORMAL HIGH (ref 11.0–14.6)
WBC: 4.7 10*3/uL (ref 4.0–10.3)

## 2017-09-14 LAB — COMPREHENSIVE METABOLIC PANEL
ALBUMIN: 3.7 g/dL (ref 3.5–5.0)
ALK PHOS: 80 U/L (ref 40–150)
ALT: 23 U/L (ref 0–55)
ANION GAP: 9 meq/L (ref 3–11)
AST: 26 U/L (ref 5–34)
BUN: 19.4 mg/dL (ref 7.0–26.0)
CALCIUM: 10.1 mg/dL (ref 8.4–10.4)
CHLORIDE: 105 meq/L (ref 98–109)
CO2: 23 mEq/L (ref 22–29)
CREATININE: 1.1 mg/dL (ref 0.7–1.3)
EGFR: >60 mL/min/{1.73_m2} (ref >60)
Glucose: 73 mg/dl (ref 70–140)
POTASSIUM: 4.3 meq/L (ref 3.5–5.1)
Sodium: 136 mEq/L (ref 136–145)
Total Bilirubin: 0.42 mg/dL (ref 0.20–1.20)
Total Protein: 8 g/dL (ref 6.4–8.3)

## 2017-09-14 MED ORDER — BORTEZOMIB CHEMO SQ INJECTION 3.5 MG (2.5MG/ML)
1.3000 mg/m2 | Freq: Once | INTRAMUSCULAR | Status: AC
  Administered 2017-09-14: 2.5 mg via SUBCUTANEOUS
  Filled 2017-09-14: qty 2.5

## 2017-09-14 MED ORDER — PROCHLORPERAZINE MALEATE 10 MG PO TABS
ORAL_TABLET | ORAL | Status: AC
  Filled 2017-09-14: qty 1

## 2017-09-14 MED ORDER — HYDROXYZINE HCL 25 MG PO TABS: 25.0000 mg | ORAL_TABLET | Freq: Three times a day (TID) | ORAL | 0 refills | Status: DC | PRN

## 2017-09-14 MED ORDER — PROCHLORPERAZINE MALEATE 10 MG PO TABS
10.0000 mg | ORAL_TABLET | Freq: Once | ORAL | Status: AC
  Administered 2017-09-14: 10 mg via ORAL

## 2017-09-14 NOTE — Patient Instructions (Signed)
[facility] Discharge Instructions for Patients Receiving Chemotherapy  Today you received the following chemotherapy agents: Bortezomib (Velcade)  To help prevent nausea and vomiting after your treatment, we encourage you to take your nausea medication  as prescribed.    If you develop nausea and vomiting that is not controlled by your nausea medication, call the clinic.   BELOW ARE SYMPTOMS THAT SHOULD BE REPORTED IMMEDIATELY:  *FEVER GREATER THAN 100.5 F  *CHILLS WITH OR WITHOUT FEVER  NAUSEA AND VOMITING THAT IS NOT CONTROLLED WITH YOUR NAUSEA MEDICATION  *UNUSUAL SHORTNESS OF BREATH  *UNUSUAL BRUISING OR BLEEDING  TENDERNESS IN MOUTH AND THROAT WITH OR WITHOUT PRESENCE OF ULCERS  *URINARY PROBLEMS  *BOWEL PROBLEMS  UNUSUAL RASH Items with * indicate a potential emergency and should be followed up as soon as possible.  Feel free to call the clinic should you have any questions or concerns. The clinic phone number is [phone].  Please show the CHEMO ALERT CARD at check-in to the Emergency Department and triage nurse.   

## 2017-09-14 NOTE — Telephone Encounter (Signed)
No 12/28 los.  

## 2017-09-14 NOTE — Progress Notes (Signed)
[place]  [phone] [phone]    ID: Andre [name]  DOB: 12-01-1950  MR#: [id number] CSN#: [id number]   Patient Care Team: Andre Anger, MD as PCP - General (Internal Medicine) [name], Andre Dad, MD as Consulting Physician (Oncology) Andre Living, MD as Consulting Physician (Neurology) Andre Lewandowsky, MD as Consulting Physician (Internal Medicine) Andre Ducking, MD as Consulting Physician (Neurology) Andre [name], DDS as Referring Physician (Oral Surgery)   CHIEF COMPLAINT: Multiple myeloma  CURRENT TREATMENT: Maintenance therapy with bortezomib, zolendronate, and pomalidomide  INTERVAL HISTORY:  Andre [name] returns today for follow-up and treatment of his multiple myeloma.  He continues on bortezomib every 2 weeks, which he tolerates well.  He does not complain of significant fatigue and has had no worsening issues regarding neuropathy  He is also on pomalidomide at 1 mg daily.  He does not like the medication.  He takes it at 5pm daily. He takes this 21 days on, 7 days off.  He has had significant itching since restarting the medication.  It has been all over his body.  He is about half way thorugh his cycle.  He denies any rash, shortness of breath, or sign of any other reaction. Benadryl helps some, but only lasts four hours.    Andre [name] also receives zolendronate every 3 months, and received it last on 11/1.  This is due again on 10/19/2017.    REVIEW OF SYSTEMS: Andre [name] remains very stable overall. He denies any issues other than the itching noted above.  He denies fevers, body aches, fatigue, or any other concerns today.  A detailed ROS was conducted and is non contributory.    HISTORY OF PRESENT ILLNESS:  From the original intake nodes:  The patient was worked up for peptic ulcer disease in August of 2011, with significant bleeding and anemia.  The patient was Helicobacter pylori negative.  He received some  epinephrine when he had his EGD and then started on Protonix.  [name] anemia slowly resolved so that by September, his hemoglobin was up to 10.9 and by earlier this month, his hemoglobin was up to 12.5.    As part of his general workup, he was found to have a slightly elevated globulin fraction.  In September, his total protein was 8.3 with an albumin of 3.8.  In January, the total protein was 8.4 and albumin 3.6.  With persistence of this slight abnormality, Dr. Olevia [name] obtained serum immunofixation and SPEP.  The SPTP showed an M-spike of 2.67 grams.  A total IgG was 4,190.  Total IgA low at 28.  Total IgM low at 28 also.  The immunofixation showed a monoclonal IgG lambda paraprotein.  There were also monoclonal free lambda light chains present. With this information, the patient was referred for further evaluation.   A diagnosis of myeloma was confirmed by bone marrow biopsy and subsequebnt treatment is as detailed below  PAST MEDICAL HISTORY: Past Medical History:  Diagnosis Date  . Allergy   . Anemia   . Arthritis   . Asthma    no treatment x 20 years  . Depression   . Double vision    occurs at times   . Duodenal ulcer   . GERD (gastroesophageal reflux disease)   . Hyperlipidemia   . Hypertension   . Hypothyroidism   . Multiple myeloma 07/04/2011  . Thyroid disease   Significant for peptic ulcer disease as noted above.  History of hyperlipidemia.  History of  anxiety and depression.  History of GERD which is significantly improved with weight loss and history of reactive airway disease, possibly secondary to the GERD which also improved with weight loss.  There was a history of obesity, now much improved secondary to exercise and diet.   PAST SURGICAL HISTORY: Past Surgical History:  Procedure Laterality Date  . BONE MARROW TRANSPLANT  2011   for MM  . CARDIOLITE STUDY  11/25/2003   NORMAL  . TONSILLECTOMY      FAMILY HISTORY Family History  Problem Relation Age of  Onset  . Throat cancer Mother   . Hypertension Father   . Stroke Father   . Asthma Father   . Diabetes Father   . Pancreatic cancer Brother   [name] father died at the age of 59 following a stroke.  [name] mother died with cancer of the throat at the age of 85.   She had a history of depression and was a smoker.  Not clear how much alcohol she drank, according to the patient.  The patient had two brothers.  One died from the age of 32 from a "kidney problem."  The second one died at the age of 5 from pancreatic cancer. (This may have been duodenal cancer. 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  His wife of 37+ years was Andre [name], but they are in process of divorce, to be finalized August 2018. In 2016 he remarried, his current wife's name is Andre [name].  The patient has two daughters:  Andre [name], who works in [place], who had her second child April 2016. Both daughters live here in [place]. The patient is very close to his daughters.  [name] first daughter, Andre [name], born 03/06/2013, apparently has Weber/Osler/Rendu. The patient is not a church attender.  Andre [name] also participates in [employer] even though actually there is no alcohol or drug problem in the family.  He participates in this group and he gets quite a bit of support from it.     ADVANCED DIRECTIVES: In place  HEALTH MAINTENANCE: (updated 07/16/2013) Social History   Tobacco Use  . Smoking status: Former Smoker    Years: 3.00    Last attempt to quit: 03/27/1969    Years since quitting: 48.5  . Smokeless tobacco: Never Used  Substance Use Topics  . Alcohol use: No  . Drug use: No    Colonoscopy:  PSA:  Sept 2014, normal/Andre [name]  Lipid panel: Sept 2014/Andre [name]  Allergies  Allergen Reactions  . Atorvastatin Other (See Comments)  .  Rosuvastatin     Other reaction(s): Liver Disorder  . Crestor [Rosuvastatin Calcium]     ADVERSE EFFECTS ON LIVER  . Lipitor [Atorvastatin Calcium]   . Septra [Sulfamethoxazole-Trimethoprim] Rash   Current Outpatient Medications  Medication Sig Dispense Refill  . aspirin EC 81 MG tablet Take 1 tablet (81 mg total) by mouth daily. 100 tablet 3  . Dereke complex vitamins tablet Take 1 tablet by mouth daily. 100 tablet 3  . cetirizine (ZYRTEC) 10 MG tablet Take 10 mg by mouth daily.    . Cholecalciferol (VITAMIN D3) 2000 units capsule Take 1 capsule (2,000 Units total) by mouth daily. 100 capsule 3  . Cholecalciferol (VITAMIN D3) 50000 units CAPS Take 1 capsule by mouth once a week. 6 capsule 0  .  docusate sodium (COLACE) 100 MG capsule Take 100 mg by mouth daily as needed for mild constipation or moderate constipation.    [name] escitalopram (LEXAPRO) 10 MG tablet TAKE 1 TABLET BY MOUTH  DAILY 90 tablet 1  . feeding supplement, ENSURE ENLIVE, (ENSURE ENLIVE) LIQD Take 237 mLs by mouth 2 (two) times daily between meals. 237 mL 12  . levothyroxine (SYNTHROID, LEVOTHROID) 150 MCG tablet TAKE 1 TABLET BY MOUTH  EVERY MORNING ON AN EMPTY  STOMACH 90 tablet 1  . ondansetron (ZOFRAN) 8 MG tablet Take 1 tablet (8 mg total) by mouth every 8 (eight) hours as needed for nausea or vomiting. 20 tablet 0  . pantoprazole (PROTONIX) 40 MG tablet TAKE 1 TABLET BY MOUTH  TWICE A DAY BEFORE MEALS 180 tablet 3  . POMALYST 1 MG capsule TAKE 1 CAPSULE BY MOUTH EVERY DAY FOR 21 DAYS, THEN 7 DAYS OFF 21 capsule 0  . ranitidine (ZANTAC) 150 MG tablet Take 150 mg by mouth 2 (two) times daily.    . sildenafil (VIAGRA) 25 MG tablet Take 1 tablet (25 mg total) by mouth daily as needed for erectile dysfunction. 10 tablet 0  . triamcinolone cream (KENALOG) 0.5 % Apply 1 application topically 4 (four) times daily. 60 g 2  . valACYclovir (VALTREX) 1000 MG tablet TAKE 1 TABLET BY MOUTH  DAILY 90 tablet 3  . zolendronic acid (ZOMETA) 4  MG/5ML injection Inject 4 mg into the vein every 3 (three) months.     No current facility-administered medications for this visit.    Objective:   Vitals:   09/14/17 1030  BP: 126/80  Pulse: 75  Resp: 17  Temp: 98.4 F (36.9 C)  SpO2: 100%  Body mass index is 27.2 kg/m. Filed Weights   09/14/17 1030  Weight: 184 lb 3.2 oz (83.6 kg)  GENERAL: Patient is a well appearing male in no acute distress HEENT:  Sclerae anicteric.  Oropharynx clear and moist. No ulcerations or evidence of oropharyngeal candidiasis. Neck is supple.  NODES:  No cervical, supraclavicular, or axillary lymphadenopathy palpated.  LUNGS:  Clear to auscultation bilaterally.  No wheezes or rhonchi. HEART:  Regular rate and rhythm. No murmur appreciated. ABDOMEN:  Soft, nontender.  Positive, normoactive bowel sounds. No organomegaly palpated. MSK:  No focal spinal tenderness to palpation. Full range of motion bilaterally in the upper extremities. EXTREMITIES:  No peripheral edema.   SKIN:  Clear with no obvious rashes or skin changes. No nail dyscrasia. NEURO:  Nonfocal. Well oriented.  Appropriate affect.       Appointment on 09/14/2017  Component Date Value Ref Range Status  . WBC 09/14/2017 4.7  4.0 - 10.3 10e3/uL Final  . NEUT# 09/14/2017 2.5  1.5 - 6.5 10e3/uL Final  . HGB 09/14/2017 13.9  13.0 - 17.1 g/dL Final  . HCT 09/14/2017 40.6  38.4 - 49.9 % Final  . Platelets 09/14/2017 170  140 - 400 10e3/uL Final  . MCV 09/14/2017 107.5* 79.3 - 98.0 fL Final  . MCH 09/14/2017 36.7* 27.2 - 33.4 pg Final  . MCHC 09/14/2017 34.2  32.0 - 36.0 g/dL Final  . RBC 09/14/2017 3.78* 4.20 - 5.82 10e6/uL Final  . RDW 09/14/2017 14.9* 11.0 - 14.6 % Final  . lymph# 09/14/2017 1.2  0.9 - 3.3 10e3/uL Final  . MONO# 09/14/2017 0.8  0.1 - 0.9 10e3/uL Final  . Eosinophils Absolute 09/14/2017 0.2  0.0 - 0.5 10e3/uL Final  . Basophils Absolute 09/14/2017 0.1  0.0 - 0.1  10e3/uL Final  . NEUT% 09/14/2017 52.6  39.0 - 75.0 %  Final  . LYMPH% 09/14/2017 25.3  14.0 - 49.0 % Final  . MONO% 09/14/2017 16.6* 0.0 - 14.0 % Final  . EOS% 09/14/2017 4.4  0.0 - 7.0 % Final  . BASO% 09/14/2017 1.1  0.0 - 2.0 % Final     STUDIES: He continues in remission, with no observed M spike.  We are repeating the kappa and lambda light chains today  ASSESSMENT: 66 y.o. [place] man with a history of multiple myeloma diagnosed February of 2012, with an initial M-spike of 2.6 g/dL, IFE showing an IgG lambda paraprotein and free lambda light chains in the urine. Cytogenetics showed trisomy 66. Bone marrow biopsy showed a 22% plasmacytosis. Treated with   (1) bortezomib (subcutaneously), lenalidomide, and dexamethasone, with repeat bone marrow biopsy May of 2012 showing 10% plasmacytosis   (2) high-dose chemotherapy with BCNU and melphalan at [facility], followed by stem cell rescue July of 2012   (3) on zoledronic acid started December of 2012, initially monthly, currently given every 3 months, most recent dose  12/07/2015  (4) low-dose lenalidomide resumed April 2013, interrupted several times.  Resumed again on 02/19/2013, ata dose of 5 mg daily, 21 days on and 7 days off, later further reduced to 7 days on, 7 days off  (5) CNS symptoms and abnormal brain MRI extensively evaluated by neurology with no definitive diagnosis established  (6) rising M spike noted June 2014 but did not meet criteria for carfilzomib study  (7) on lenalidomide 25 mg daily, 14 days on, 7 days off, starting 04/18/2013, interrupted December 2014 because of rash;   (a) resumed January 2015 at 10 mg/ day at 21 days on/ 7 days off  (Rohith) starting 08/18/2014 decreased to 10 mg/ day 14 days on, 7 days off because of cytopenias  (c) as of February 2016 was on 5 mg daily 7 days on 7 days off  (d) lenalidomide discontinued December 2016 with evidence of disease progression  (8) transient global amnesia 05/29/2015, resolved without intervention  (9) starting PVD  10/25/2015 w ASA 325 thromboprophylaxis, valacyclovir prophylaxis, last dose 12/17/2015  (a) pomalidomide 4 mg/d days 1-14  (Kamonte) bortezomib sQ days 2,5,9,12 of each 21 day cycle  (c) dexamethasome 20 mg two days a week  (10) metapneumovirus pneumonia April 2017  (a) completing course of steroids and week of bactrim mid April 2017  (11) status post second autologous transplant at [facility] 02/04/2016(preparatory regimen melphalan 200 mg/m)  (a) received twelve-month vaccinations 03/14/2017 (DPT, Haemophilus, Pneumovax 13, polio)  (Kendryck) 14 months injections 05/04/2016 include DTaP, Hib conjugate, HepB energex Alyan 20 mcg/ml, Prevnar 13  (c) 24 month vaccines due at [facility] June 2019  (12) maintenance therapy started November 2017, consisting of  (a) bortezomib 1.3 mg/M2 QOW, first dose 07/27/2016  (Jaxtin) pomalidomide 1 mg days 1-21 Q28 days, started 07/19/2016  (c) zolendronate monthly started 07/27/2016 (previously Q 3 months) however patient unable to tolerate, and changed back to q3 month in April, 2018   (d) Bortezomib changed to monthly as of June 2018 because of tolerance issues, however changed back to every 2 weeks in January, 2019 due to slight elevation in M-Spike  PLAN:  Andre [name] is doing well today.  I reviewed his CBC with him which was normal today.  He will proceed with Bortezomib today.  He and I reviewed that in his myeloma panel he had a low but detectable M Spike of 0.2.  We will await his results today.  In regards to the itching, I recommended he try taking Atarax TID PRN instead of Benadryl.  He is agreeable.  He will continue with Bortezomib every 2 weeks.  He will review the pomalidomide with Dr. [name] at his next appointment prior to starting the pomalidomide back when he returns in 4 weeks.  He will see Dr. Alvie Heidelberg again sometime in the summer.  Andre [name] will call with any issues that may develop before his next appointment with [place].    A total of (30) minutes of face-to-face time was  spent with this patient with greater than 50% of that time in counseling and care-coordination.   Wilber Bihari, NP  09/14/17 10:47 AM Medical Oncology and Hematology [facility] [address] [place], [place] [address] Tel. [phone]    Fax. [phone]

## 2017-09-17 ENCOUNTER — Other Ambulatory Visit: Payer: Self-pay | Admitting: Internal Medicine

## 2017-09-17 LAB — MULTIPLE MYELOMA PANEL, SERUM
ALBUMIN SERPL ELPH-MCNC: 3.5 g/dL (ref 2.9–4.4)
ALPHA 1: 0.2 g/dL (ref 0.0–0.4)
Albumin/Glob SerPl: 0.9 (ref 0.7–1.7)
Alpha2 Glob SerPl Elph-Mcnc: 0.9 g/dL (ref 0.4–1.0)
B-GLOBULIN SERPL ELPH-MCNC: 1.1 g/dL (ref 0.7–1.3)
Gamma Glob SerPl Elph-Mcnc: 1.7 g/dL (ref 0.4–1.8)
Globulin, Total: 3.9 g/dL (ref 2.2–3.9)
IgA, Qn, Serum: 201 mg/dL (ref 61–437)
IgM, Qn, Serum: 56 mg/dL (ref 20–172)
M PROTEIN SERPL ELPH-MCNC: 0.4 g/dL — AB
TOTAL PROTEIN: 7.4 g/dL (ref 6.0–8.5)

## 2017-09-17 LAB — KAPPA/LAMBDA LIGHT CHAINS
IG KAPPA FREE LIGHT CHAIN: 54 mg/L — AB (ref 3.3–19.4)
IG LAMBDA FREE LIGHT CHAIN: 17.8 mg/L (ref 5.7–26.3)
KAPPA/LAMBDA FLC RATIO: 3.03 — AB (ref 0.26–1.65)

## 2017-09-19 ENCOUNTER — Telehealth: Payer: Self-pay

## 2017-09-19 ENCOUNTER — Other Ambulatory Visit: Payer: Self-pay | Admitting: Internal Medicine

## 2017-09-19 NOTE — Telephone Encounter (Signed)
Pt calling to ask if joint pain is a sign of his cancer. Pt recently started to become more active with his exercise routine and has experienced joint pain (on and off). Pt states it doesn't bother him too much but is just curious if it is part of his cancer.  Told pt that it could be from his recent increase in activity. Suggested that he start a lower impact exercise such as swimming and then increase activity as he adjust to this. Told pt that he can try warm pack on the joint pain and plenty of hydration with water or non caffeine fluids or juices. Pt verbalized understanding and states information was helpful. No further questions at this time.

## 2017-09-20 ENCOUNTER — Other Ambulatory Visit: Payer: Self-pay | Admitting: Oncology

## 2017-09-20 NOTE — Progress Notes (Unsigned)
[place]  [phone] Fax:([phone]) [phone]    ID: Andre [name]  DOB: [birth year]  MR#: [id number] CSN#: [id number]   Patient Care Team: Cassandria Anger, MD as PCP - General (Internal Medicine) Nariyah Osias, [name], MD as Consulting Physician (Oncology) Concepcion Living, MD as Consulting Physician (Neurology) [name], MD as Consulting Physician (Internal Medicine) Kathrynn Ducking, MD as Consulting Physician (Neurology) Dorna Bloom, Derwood as Referring Physician (Oral Surgery)   CHIEF COMPLAINT: Multiple myeloma  CURRENT TREATMENT: Maintenance therapy with bortezomib, zolendronate, and pomalidomide  INTERVAL HISTORY:  Andre [name] returns today for follow-up and treatment of his multiple myeloma.  He continues on bortezomib every 2 weeks, which he tolerates well.  He does not complain of significant fatigue and has had no worsening issues regarding neuropathy  He is also on pomalidomide at 1 mg daily.  He does not like the medication.  He takes it at 5pm daily. He takes this 21 days on, 7 days off.  He has had significant itching since restarting the medication.  It has been all over his body.  He is about half way thorugh his cycle.  He denies any rash, shortness of breath, or sign of any other reaction. Benadryl helps some, but only lasts four hours.    [name] also receives zolendronate every 3 months, and received it last on 11/1.  This is due again on 10/19/2017.    REVIEW OF SYSTEMS: Andre [name] remains very stable overall. He denies any issues other than the itching noted above.  He denies fevers, body aches, fatigue, or any other concerns today.  A detailed ROS was conducted and is non contributory.    HISTORY OF PRESENT ILLNESS:  From the original intake nodes:  The patient was worked up for peptic ulcer disease in August of 2011, with significant bleeding and anemia.  The patient was Helicobacter pylori negative.  He received some  epinephrine when he had his EGD and then started on Protonix.  [name] anemia slowly resolved so that by September, his hemoglobin was up to 10.9 and by earlier this month, his hemoglobin was up to 12.5.    As part of his general workup, he was found to have a slightly elevated globulin fraction.  In September, his total protein was 8.3 with an albumin of 3.8.  In January, the total protein was 8.4 and albumin 3.6.  With persistence of this slight abnormality, Dr. Olevia Perches obtained serum immunofixation and SPEP.  The SPTP showed an M-spike of 2.67 grams.  A total IgG was 4,190.  Total IgA low at 28.  Total IgM low at 28 also.  The immunofixation showed a monoclonal IgG lambda paraprotein.  There were also monoclonal free lambda light chains present. With this information, the patient was referred for further evaluation.   A diagnosis of myeloma was confirmed by bone marrow biopsy and subsequebnt treatment is as detailed below  PAST MEDICAL HISTORY: Past Medical History:  Diagnosis Date  . Allergy   . Anemia   . Arthritis   . Asthma    no treatment x 20 years  . Depression   . Double vision    occurs at times   . Duodenal ulcer   . GERD (gastroesophageal reflux disease)   . Hyperlipidemia   . Hypertension   . Hypothyroidism   . Multiple myeloma 07/04/2011  . Thyroid disease   Significant for peptic ulcer disease as noted above.  History of hyperlipidemia.  History of  anxiety and depression.  History of GERD which is significantly improved with weight loss and history of reactive airway disease, possibly secondary to the GERD which also improved with weight loss.  There was a history of obesity, now much improved secondary to exercise and diet.   PAST SURGICAL HISTORY: Past Surgical History:  Procedure Laterality Date  . BONE MARROW TRANSPLANT  2011   for MM  . CARDIOLITE STUDY  11/25/2003   NORMAL  . TONSILLECTOMY      FAMILY HISTORY Family History  Problem Relation Age of  Onset  . Throat cancer Mother   . Hypertension Father   . Stroke Father   . Asthma Father   . Diabetes Father   . Pancreatic cancer Brother   [name] father died at the age of 59 following a stroke.  [name] mother died with cancer of the throat at the age of 85.   She had a history of depression and was a smoker.  Not clear how much alcohol she drank, according to the patient.  The patient had two brothers.  One died from the age of 32 from a "kidney problem."  The second one died at the age of 5 from pancreatic cancer. (This may have been duodenal cancer. 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  His wife of 37+ years was [name], but they are in process of divorce, to be finalized August 2018. In 2016 he remarried, his current wife's name is [name].  The patient has two daughters:  Andre [name], who works in [place], who had her second child April 2016. Both daughters live here in [place]. The patient is very close to his daughters.  [name] first daughter, Andre [name], born 03/06/2013, apparently has Weber/Osler/Rendu. The patient is not a church attender.  [name] also participates in [employer] even though actually there is no alcohol or drug problem in the family.  He participates in this group and he gets quite a bit of support from it.     ADVANCED DIRECTIVES: In place  HEALTH MAINTENANCE: (updated 07/16/2013) Social History   Tobacco Use  . Smoking status: Former Smoker    Years: 3.00    Last attempt to quit: 03/27/1969    Years since quitting: 48.5  . Smokeless tobacco: Never Used  Substance Use Topics  . Alcohol use: No  . Drug use: No    Colonoscopy:  PSA:  Sept 2014, normal/Dr. [name]  Lipid panel: Sept 2014/Dr. [name]  Allergies  Allergen Reactions  . Atorvastatin Other (See Comments)  .  Rosuvastatin     Other reaction(s): Liver Disorder  . Crestor [Rosuvastatin Calcium]     ADVERSE EFFECTS ON LIVER  . Lipitor [Atorvastatin Calcium]   . Septra [Sulfamethoxazole-Trimethoprim] Rash   Current Outpatient Medications  Medication Sig Dispense Refill  . aspirin EC 81 MG tablet Take 1 tablet (81 mg total) by mouth daily. 100 tablet 3  . Dereke complex vitamins tablet Take 1 tablet by mouth daily. 100 tablet 3  . cetirizine (ZYRTEC) 10 MG tablet Take 10 mg by mouth daily.    . Cholecalciferol (VITAMIN D3) 2000 units capsule Take 1 capsule (2,000 Units total) by mouth daily. 100 capsule 3  . Cholecalciferol (VITAMIN D3) 50000 units CAPS Take 1 capsule by mouth once a week. 6 capsule 0  .  docusate sodium (COLACE) 100 MG capsule Take 100 mg by mouth daily as needed for mild constipation or moderate constipation.    [name] escitalopram (LEXAPRO) 10 MG tablet TAKE 1 TABLET BY MOUTH  DAILY 90 tablet 3  . escitalopram (LEXAPRO) 10 MG tablet take 1 tablet by mouth once daily 30 tablet 11  . feeding supplement, ENSURE ENLIVE, (ENSURE ENLIVE) LIQD Take 237 mLs by mouth 2 (two) times daily between meals. 237 mL 12  . hydrOXYzine (ATARAX/VISTARIL) 25 MG tablet Take 1 tablet (25 mg total) by mouth 3 (three) times daily as needed. 30 tablet 0  . levothyroxine (SYNTHROID, LEVOTHROID) 150 MCG tablet TAKE 1 TABLET BY MOUTH  EVERY MORNING ON AN EMPTY  STOMACH 90 tablet 3  . ondansetron (ZOFRAN) 8 MG tablet Take 1 tablet (8 mg total) by mouth every 8 (eight) hours as needed for nausea or vomiting. 20 tablet 0  . pantoprazole (PROTONIX) 40 MG tablet TAKE 1 TABLET BY MOUTH  TWICE A DAY BEFORE MEALS 180 tablet 3  . POMALYST 1 MG capsule TAKE 1 CAPSULE BY MOUTH EVERY DAY FOR 21 DAYS, THEN 7 DAYS OFF 21 capsule 0  . ranitidine (ZANTAC) 150 MG tablet Take 150 mg by mouth 2 (two) times daily.    . sildenafil (VIAGRA) 25 MG tablet Take 1 tablet (25 mg total) by mouth daily as needed for erectile dysfunction. 10 tablet 0    . triamcinolone cream (KENALOG) 0.5 % Apply 1 application topically 4 (four) times daily. 60 g 2  . valACYclovir (VALTREX) 1000 MG tablet TAKE 1 TABLET BY MOUTH  DAILY 90 tablet 3  . zolendronic acid (ZOMETA) 4 MG/5ML injection Inject 4 mg into the vein every 3 (three) months.     No current facility-administered medications for this visit.    Objective:   There were no vitals filed for this visit.There is no height or weight on file to calculate BMI. There were no vitals filed for this visit.GENERAL: Patient is a well appearing male in no acute distress HEENT:  Sclerae anicteric.  Oropharynx clear and moist. No ulcerations or evidence of oropharyngeal candidiasis. Neck is supple.  NODES:  No cervical, supraclavicular, or axillary lymphadenopathy palpated.  LUNGS:  Clear to auscultation bilaterally.  No wheezes or rhonchi. HEART:  Regular rate and rhythm. No murmur appreciated. ABDOMEN:  Soft, nontender.  Positive, normoactive bowel sounds. No organomegaly palpated. MSK:  No focal spinal tenderness to palpation. Full range of motion bilaterally in the upper extremities. EXTREMITIES:  No peripheral edema.   SKIN:  Clear with no obvious rashes or skin changes. No nail dyscrasia. NEURO:  Nonfocal. Well oriented.  Appropriate affect.       No visits with results within 1 Day(s) from this visit.  Latest known visit with results is:  Appointment on 09/14/2017  Component Date Value Ref Range Status  . WBC 09/14/2017 4.7  4.0 - 10.3 10e3/uL Final  . NEUT# 09/14/2017 2.5  1.5 - 6.5 10e3/uL Final  . HGB 09/14/2017 13.9  13.0 - 17.1 g/dL Final  . HCT 09/14/2017 40.6  38.4 - 49.9 % Final  . Platelets 09/14/2017 170  140 - 400 10e3/uL Final  . MCV 09/14/2017 107.5* 79.3 - 98.0 fL Final  . MCH 09/14/2017 36.7* 27.2 - 33.4 pg Final  . MCHC 09/14/2017 34.2  32.0 - 36.0 g/dL Final  . RBC 09/14/2017 3.78* 4.20 - 5.82 10e6/uL Final  . RDW 09/14/2017 14.9* 11.0 - 14.6 % Final  . lymph#  09/14/2017 1.2  0.9 - 3.3 10e3/uL Final  . MONO# 09/14/2017 0.8  0.1 - 0.9 10e3/uL Final  . Eosinophils Absolute 09/14/2017 0.2  0.0 - 0.5 10e3/uL Final  . Basophils Absolute 09/14/2017 0.1  0.0 - 0.1 10e3/uL Final  . NEUT% 09/14/2017 52.6  39.0 - 75.0 % Final  . LYMPH% 09/14/2017 25.3  14.0 - 49.0 % Final  . MONO% 09/14/2017 16.6* 0.0 - 14.0 % Final  . EOS% 09/14/2017 4.4  0.0 - 7.0 % Final  . BASO% 09/14/2017 1.1  0.0 - 2.0 % Final  . Sodium 09/14/2017 136  136 - 145 mEq/L Final  . Potassium 09/14/2017 4.3  3.5 - 5.1 mEq/L Final  . Chloride 09/14/2017 105  98 - 109 mEq/L Final  . CO2 09/14/2017 23  22 - 29 mEq/L Final  . Glucose 09/14/2017 73  70 - 140 mg/dl Final   Glucose reference range is for nonfasting patients. Fasting glucose reference range is 70- 100.  [name] BUN 09/14/2017 19.4  7.0 - 26.0 mg/dL Final  . Creatinine 09/14/2017 1.1  0.7 - 1.3 mg/dL Final  . Total Bilirubin 09/14/2017 0.42  0.20 - 1.20 mg/dL Final  . Alkaline Phosphatase 09/14/2017 80  40 - 150 U/L Final  . AST 09/14/2017 26  5 - 34 U/L Final  . ALT 09/14/2017 23  0 - 55 U/L Final  . Total Protein 09/14/2017 8.0  6.4 - 8.3 g/dL Final  . Albumin 09/14/2017 3.7  3.5 - 5.0 g/dL Final  . Calcium 09/14/2017 10.1  8.4 - 10.4 mg/dL Final  . Anion Gap 09/14/2017 9  3 - 11 mEq/L Final  . EGFR 09/14/2017 >60  >60 ml/min/1.73 m2 Final   eGFR is calculated using the CKD-EPI Creatinine Equation (2009)  . Ig Kappa Free Light Chain 09/14/2017 54.0* 3.3 - 19.4 mg/L Final  . Ig Lambda Free Light Chain 09/14/2017 17.8  5.7 - 26.3 mg/L Final  . Kappa/Lambda FluidC Ratio 09/14/2017 3.03* 0.26 - 1.65 Final  . IgG, Qn, Serum 09/14/2017 1,646* 700 - 1600 mg/dL Final  . IgA, Qn, Serum 09/14/2017 201  61 - 437 mg/dL Final  . IgM, Qn, Serum 09/14/2017 56  20 - 172 mg/dL Final  . Total Protein 09/14/2017 7.4  6.0 - 8.5 g/dL Final  . Albumin SerPl Elph-Mcnc 09/14/2017 3.5  2.9 - 4.4 g/dL Final  . Alpha 1 09/14/2017 0.2  0.0 - 0.4 g/dL  Final  . Alpha2 Glob SerPl Elph-Mcnc 09/14/2017 0.9  0.4 - 1.0 g/dL Final  . Dameir-Globulin SerPl Elph-Mcnc 09/14/2017 1.1  0.7 - 1.3 g/dL Final  . Gamma Glob SerPl Elph-Mcnc 09/14/2017 1.7  0.4 - 1.8 g/dL Final  . M Protein SerPl Elph-Mcnc 09/14/2017 0.4* Not Observed g/dL Final   An additional m-spike was observed at a concentration of 0.3 g/dl  . Globulin, Total 09/14/2017 3.9  2.2 - 3.9 g/dL Final  . Albumin/Glob SerPl 09/14/2017 0.9  0.7 - 1.7 Final  . IFE 1 09/14/2017 Comment   Final   Comment: Immunofixation shows IgG monoclonal protein with kappa light chain specificity.   . Please Note 09/14/2017 Comment   Final   Comment: Protein electrophoresis scan will follow via computer, mail, or courier delivery.      STUDIES: He continues in remission, with no observed M spike.  We are repeating the kappa and lambda light chains today  ASSESSMENT: 67 y.o. [place] man with a history of multiple myeloma diagnosed February of 2012, with an initial M-spike  of 2.6 g/dL, IFE showing an IgG lambda paraprotein and free lambda light chains in the urine. Cytogenetics showed trisomy 30. Bone marrow biopsy showed a 22% plasmacytosis. Treated with   (1) bortezomib (subcutaneously), lenalidomide, and dexamethasone, with repeat bone marrow biopsy May of 2012 showing 10% plasmacytosis   (2) high-dose chemotherapy with BCNU and melphalan at [facility], followed by stem cell rescue July of 2012   (3) on zoledronic acid started December of 2012, initially monthly, currently given every 3 months, most recent dose  12/07/2015  (4) low-dose lenalidomide resumed April 2013, interrupted several times.  Resumed again on 02/19/2013, ata dose of 5 mg daily, 21 days on and 7 days off, later further reduced to 7 days on, 7 days off  (5) CNS symptoms and abnormal brain MRI extensively evaluated by neurology with no definitive diagnosis established  (6) rising M spike noted June 2014 but did not meet criteria for  carfilzomib study  (7) on lenalidomide 25 mg daily, 14 days on, 7 days off, starting 04/18/2013, interrupted December 2014 because of rash;   (a) resumed January 2015 at 10 mg/ day at 21 days on/ 7 days off  (Ravi) starting 08/18/2014 decreased to 10 mg/ day 14 days on, 7 days off because of cytopenias  (c) as of February 2016 was on 5 mg daily 7 days on 7 days off  (d) lenalidomide discontinued December 2016 with evidence of disease progression  (8) transient global amnesia 05/29/2015, resolved without intervention  (9) starting PVD 10/25/2015 w ASA 325 thromboprophylaxis, valacyclovir prophylaxis, last dose 12/17/2015  (a) pomalidomide 4 mg/d days 1-14  (Tyrique) bortezomib sQ days 2,5,9,12 of each 21 day cycle  (c) dexamethasome 20 mg two days a week  (10) metapneumovirus pneumonia April 2017  (a) completing course of steroids and week of bactrim mid April 2017  (11) status post second autologous transplant at [facility] 02/04/2016(preparatory regimen melphalan 200 mg/m)  (a) received twelve-month vaccinations 03/14/2017 (DPT, Haemophilus, Pneumovax 13, polio)  (Lashaun) 14 months injections 05/04/2016 include DTaP, Hib conjugate, HepB energex Mikias 20 mcg/ml, Prevnar 13  (c) 24 month vaccines due at [facility] June 2019  (12) maintenance therapy started November 2017, consisting of  (a) bortezomib 1.3 mg/M2 QOW, first dose 07/27/2016  (Nickalous) pomalidomide 1 mg days 1-21 Q28 days, started 07/19/2016  (c) zolendronate monthly started 07/27/2016 (previously Q 3 months) however patient unable to tolerate, and changed back to q3 month in April, 2018   (d) Bortezomib changed to monthly as of June 2018 because of tolerance issues, however changed back to every 2 weeks in January, 2019 due to slight elevation in M-Spike  PLAN:  Andre [name] is doing well today.  I reviewed his CBC with him which was normal today.  He will proceed with Bortezomib today.  He and I reviewed that in his myeloma panel he had a low but detectable M Spike  of 0.2.  We will await his results today.  In regards to the itching, I recommended he try taking Atarax TID PRN instead of Benadryl.  He is agreeable.  He will continue with Bortezomib every 2 weeks.  He will review the pomalidomide with Dr. [name] at his next appointment prior to starting the pomalidomide back when he returns in 4 weeks.  He will see Dr. Alvie Heidelberg again sometime in the summer.  [name] will call with any issues that may develop before his next appointment with [place].    A total of (30) minutes of face-to-face time was spent  with this patient with greater than 50% of that time in counseling and care-coordination.   Wilber Bihari, NP  09/20/17 9:28 AM Medical Oncology and Hematology [facility] [address] [place], [place] [address] Tel. [phone]    Fax. [phone]

## 2017-09-25 ENCOUNTER — Other Ambulatory Visit: Payer: Self-pay | Admitting: Oncology

## 2017-09-25 ENCOUNTER — Other Ambulatory Visit: Payer: Self-pay | Admitting: *Deleted

## 2017-09-25 MED ORDER — VALACYCLOVIR HCL 1 G PO TABS: 1000.0000 mg | ORAL_TABLET | Freq: Every day | ORAL | 3 refills | Status: DC

## 2017-09-25 NOTE — Progress Notes (Signed)
[facility]  [phone] [phone]    ID: Andre [name]  DOB: [birth year]  MR#: [id number] CSN#: [id number]   Patient Care Team: Cassandria Anger, MD as PCP - General (Internal Medicine) Fatimah Sundquist, [name], MD as Consulting Physician (Oncology) Concepcion Living, MD as Consulting Physician (Neurology) [name], MD as Consulting Physician (Internal Medicine) Kathrynn Ducking, MD as Consulting Physician (Neurology) Dorna Bloom, [place] as Referring Physician (Oral Surgery)   CHIEF COMPLAINT: Multiple myeloma  CURRENT TREATMENT: Maintenance therapy with bortezomib, zolendronate, and pomalidomide  INTERVAL HISTORY:  Andre [name] returns today for follow-up and treatment of his multiple myeloma.  He continues on bortezomib every 2 weeks, which he tolerates well.  He does not complain of significant fatigue and has had no worsening issues regarding neuropathy  He is also on pomalidomide at 1 mg daily.  He does not like the medication.  He takes it at 5pm daily. He takes this 21 days on, 7 days off.  He has had significant itching since restarting the medication.  It has been all over his body.  He is about half way thorugh his cycle.  He denies any rash, shortness of breath, or sign of any other reaction. Benadryl helps some, but only lasts four hours.    Andre [name] also receives zolendronate every 3 months, and received it last on 11/1.  This is due again on 10/19/2017.    REVIEW OF SYSTEMS: Andre [name] remains very stable overall. He denies any issues other than the itching noted above.  He denies fevers, body aches, fatigue, or any other concerns today.  A detailed ROS was conducted and is non contributory.    HISTORY OF PRESENT ILLNESS:  From the original intake nodes:  The patient was worked up for peptic ulcer disease in August of 2011, with significant bleeding and anemia.  The patient was Helicobacter pylori negative.  He received some  epinephrine when he had his EGD and then started on Protonix.  [name] anemia slowly resolved so that by September, his hemoglobin was up to 10.9 and by earlier this month, his hemoglobin was up to 12.5.    As part of his general workup, he was found to have a slightly elevated globulin fraction.  In September, his total protein was 8.3 with an albumin of 3.8.  In January, the total protein was 8.4 and albumin 3.6.  With persistence of this slight abnormality, Dr. Olevia Perches obtained serum immunofixation and SPEP.  The SPTP showed an M-spike of 2.67 grams.  A total IgG was 4,190.  Total IgA low at 28.  Total IgM low at 28 also.  The immunofixation showed a monoclonal IgG lambda paraprotein.  There were also monoclonal free lambda light chains present. With this information, the patient was referred for further evaluation.   A diagnosis of myeloma was confirmed by bone marrow biopsy and subsequebnt treatment is as detailed below  PAST MEDICAL HISTORY: Past Medical History:  Diagnosis Date  . Allergy   . Anemia   . Arthritis   . Asthma    no treatment x 20 years  . Depression   . Double vision    occurs at times   . Duodenal ulcer   . GERD (gastroesophageal reflux disease)   . Hyperlipidemia   . Hypertension   . Hypothyroidism   . Multiple myeloma 07/04/2011  . Thyroid disease   Significant for peptic ulcer disease as noted above.  History of hyperlipidemia.  History of  anxiety and depression.  History of GERD which is significantly improved with weight loss and history of reactive airway disease, possibly secondary to the GERD which also improved with weight loss.  There was a history of obesity, now much improved secondary to exercise and diet.   PAST SURGICAL HISTORY: Past Surgical History:  Procedure Laterality Date  . BONE MARROW TRANSPLANT  2011   for MM  . CARDIOLITE STUDY  11/25/2003   NORMAL  . TONSILLECTOMY      FAMILY HISTORY Family History  Problem Relation Age of  Onset  . Throat cancer Mother   . Hypertension Father   . Stroke Father   . Asthma Father   . Diabetes Father   . Pancreatic cancer Brother   [name] father died at the age of 59 following a stroke.  [name] mother died with cancer of the throat at the age of 85.   She had a history of depression and was a smoker.  Not clear how much alcohol she drank, according to the patient.  The patient had two brothers.  One died from the age of 32 from a "kidney problem."  The second one died at the age of 5 from pancreatic cancer. (This may have been duodenal cancer. 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  His wife of 37+ years was [name], but they are in process of divorce, to be finalized August 2018. In 2016 he remarried, his current wife's name is [name].  The patient has two daughters:  [name], who works in [place], who had her second child April 2016. Both daughters live here in [place]. The patient is very close to his daughters.  [name] first daughter, [name], born 03/06/2013, apparently has Weber/Osler/Rendu. The patient is not a church attender.  Andre [name] also participates in [place] even though actually there is no alcohol or drug problem in the family.  He participates in this group and he gets quite a bit of support from it.     ADVANCED DIRECTIVES: In place  HEALTH MAINTENANCE: (updated 07/16/2013) Social History   Tobacco Use  . Smoking status: Former Smoker    Years: 3.00    Last attempt to quit: 03/27/1969    Years since quitting: 48.5  . Smokeless tobacco: Never Used  Substance Use Topics  . Alcohol use: No  . Drug use: No    Colonoscopy:  PSA:  Sept 2014, normal/Dr. [name]  Lipid panel: Sept 2014/Dr. [name]  Allergies  Allergen Reactions  . Atorvastatin Other (See Comments)  .  Rosuvastatin     Other reaction(s): Liver Disorder  . Crestor [Rosuvastatin Calcium]     ADVERSE EFFECTS ON LIVER  . Lipitor [Atorvastatin Calcium]   . Septra [Sulfamethoxazole-Trimethoprim] Rash   Current Outpatient Medications  Medication Sig Dispense Refill  . aspirin EC 81 MG tablet Take 1 tablet (81 mg total) by mouth daily. 100 tablet 3  . Dereke complex vitamins tablet Take 1 tablet by mouth daily. 100 tablet 3  . cetirizine (ZYRTEC) 10 MG tablet Take 10 mg by mouth daily.    . Cholecalciferol (VITAMIN D3) 2000 units capsule Take 1 capsule (2,000 Units total) by mouth daily. 100 capsule 3  . Cholecalciferol (VITAMIN D3) 50000 units CAPS Take 1 capsule by mouth once a week. 6 capsule 0  .  docusate sodium (COLACE) 100 MG capsule Take 100 mg by mouth daily as needed for mild constipation or moderate constipation.    [name] escitalopram (LEXAPRO) 10 MG tablet TAKE 1 TABLET BY MOUTH  DAILY 90 tablet 3  . escitalopram (LEXAPRO) 10 MG tablet take 1 tablet by mouth once daily 30 tablet 11  . feeding supplement, ENSURE ENLIVE, (ENSURE ENLIVE) LIQD Take 237 mLs by mouth 2 (two) times daily between meals. 237 mL 12  . hydrOXYzine (ATARAX/VISTARIL) 25 MG tablet Take 1 tablet (25 mg total) by mouth 3 (three) times daily as needed. 30 tablet 0  . levothyroxine (SYNTHROID, LEVOTHROID) 150 MCG tablet TAKE 1 TABLET BY MOUTH  EVERY MORNING ON AN EMPTY  STOMACH 90 tablet 3  . ondansetron (ZOFRAN) 8 MG tablet Take 1 tablet (8 mg total) by mouth every 8 (eight) hours as needed for nausea or vomiting. 20 tablet 0  . pantoprazole (PROTONIX) 40 MG tablet TAKE 1 TABLET BY MOUTH  TWICE A DAY BEFORE MEALS 180 tablet 3  . POMALYST 1 MG capsule TAKE 1 CAPSULE BY MOUTH EVERY DAY FOR 21 DAYS, THEN 7 DAYS OFF 21 capsule 0  . ranitidine (ZANTAC) 150 MG tablet Take 150 mg by mouth 2 (two) times daily.    . sildenafil (VIAGRA) 25 MG tablet Take 1 tablet (25 mg total) by mouth daily as needed for erectile dysfunction. 10 tablet 0    . triamcinolone cream (KENALOG) 0.5 % Apply 1 application topically 4 (four) times daily. 60 g 2  . valACYclovir (VALTREX) 1000 MG tablet TAKE 1 TABLET BY MOUTH  DAILY 90 tablet 3  . zolendronic acid (ZOMETA) 4 MG/5ML injection Inject 4 mg into the vein every 3 (three) months.     No current facility-administered medications for this visit.    Objective:   There were no vitals filed for this visit.There is no height or weight on file to calculate BMI. There were no vitals filed for this visit.GENERAL: Patient is a well appearing male in no acute distress HEENT:  Sclerae anicteric.  Oropharynx clear and moist. No ulcerations or evidence of oropharyngeal candidiasis. Neck is supple.  NODES:  No cervical, supraclavicular, or axillary lymphadenopathy palpated.  LUNGS:  Clear to auscultation bilaterally.  No wheezes or rhonchi. HEART:  Regular rate and rhythm. No murmur appreciated. ABDOMEN:  Soft, nontender.  Positive, normoactive bowel sounds. No organomegaly palpated. MSK:  No focal spinal tenderness to palpation. Full range of motion bilaterally in the upper extremities. EXTREMITIES:  No peripheral edema.   SKIN:  Clear with no obvious rashes or skin changes. No nail dyscrasia. NEURO:  Nonfocal. Well oriented.  Appropriate affect.       No visits with results within 1 Day(s) from this visit.  Latest known visit with results is:  Appointment on 09/14/2017  Component Date Value Ref Range Status  . WBC 09/14/2017 4.7  4.0 - 10.3 10e3/uL Final  . NEUT# 09/14/2017 2.5  1.5 - 6.5 10e3/uL Final  . HGB 09/14/2017 13.9  13.0 - 17.1 g/dL Final  . HCT 09/14/2017 40.6  38.4 - 49.9 % Final  . Platelets 09/14/2017 170  140 - 400 10e3/uL Final  . MCV 09/14/2017 107.5* 79.3 - 98.0 fL Final  . MCH 09/14/2017 36.7* 27.2 - 33.4 pg Final  . MCHC 09/14/2017 34.2  32.0 - 36.0 g/dL Final  . RBC 09/14/2017 3.78* 4.20 - 5.82 10e6/uL Final  . RDW 09/14/2017 14.9* 11.0 - 14.6 % Final  . lymph#  09/14/2017 1.2  0.9 - 3.3 10e3/uL Final  . MONO# 09/14/2017 0.8  0.1 - 0.9 10e3/uL Final  . Eosinophils Absolute 09/14/2017 0.2  0.0 - 0.5 10e3/uL Final  . Basophils Absolute 09/14/2017 0.1  0.0 - 0.1 10e3/uL Final  . NEUT% 09/14/2017 52.6  39.0 - 75.0 % Final  . LYMPH% 09/14/2017 25.3  14.0 - 49.0 % Final  . MONO% 09/14/2017 16.6* 0.0 - 14.0 % Final  . EOS% 09/14/2017 4.4  0.0 - 7.0 % Final  . BASO% 09/14/2017 1.1  0.0 - 2.0 % Final  . Sodium 09/14/2017 136  136 - 145 mEq/L Final  . Potassium 09/14/2017 4.3  3.5 - 5.1 mEq/L Final  . Chloride 09/14/2017 105  98 - 109 mEq/L Final  . CO2 09/14/2017 23  22 - 29 mEq/L Final  . Glucose 09/14/2017 73  70 - 140 mg/dl Final   Glucose reference range is for nonfasting patients. Fasting glucose reference range is 70- 100.  [name] BUN 09/14/2017 19.4  7.0 - 26.0 mg/dL Final  . Creatinine 09/14/2017 1.1  0.7 - 1.3 mg/dL Final  . Total Bilirubin 09/14/2017 0.42  0.20 - 1.20 mg/dL Final  . Alkaline Phosphatase 09/14/2017 80  40 - 150 U/L Final  . AST 09/14/2017 26  5 - 34 U/L Final  . ALT 09/14/2017 23  0 - 55 U/L Final  . Total Protein 09/14/2017 8.0  6.4 - 8.3 g/dL Final  . Albumin 09/14/2017 3.7  3.5 - 5.0 g/dL Final  . Calcium 09/14/2017 10.1  8.4 - 10.4 mg/dL Final  . Anion Gap 09/14/2017 9  3 - 11 mEq/L Final  . EGFR 09/14/2017 >60  >60 ml/min/1.73 m2 Final   eGFR is calculated using the CKD-EPI Creatinine Equation (2009)  . Ig Kappa Free Light Chain 09/14/2017 54.0* 3.3 - 19.4 mg/L Final  . Ig Lambda Free Light Chain 09/14/2017 17.8  5.7 - 26.3 mg/L Final  . Kappa/Lambda FluidC Ratio 09/14/2017 3.03* 0.26 - 1.65 Final  . IgG, Qn, Serum 09/14/2017 1,646* 700 - 1600 mg/dL Final  . IgA, Qn, Serum 09/14/2017 201  61 - 437 mg/dL Final  . IgM, Qn, Serum 09/14/2017 56  20 - 172 mg/dL Final  . Total Protein 09/14/2017 7.4  6.0 - 8.5 g/dL Final  . Albumin SerPl Elph-Mcnc 09/14/2017 3.5  2.9 - 4.4 g/dL Final  . Alpha 1 09/14/2017 0.2  0.0 - 0.4 g/dL  Final  . Alpha2 Glob SerPl Elph-Mcnc 09/14/2017 0.9  0.4 - 1.0 g/dL Final  . Oliverio-Globulin SerPl Elph-Mcnc 09/14/2017 1.1  0.7 - 1.3 g/dL Final  . Gamma Glob SerPl Elph-Mcnc 09/14/2017 1.7  0.4 - 1.8 g/dL Final  . M Protein SerPl Elph-Mcnc 09/14/2017 0.4* Not Observed g/dL Final   An additional m-spike was observed at a concentration of 0.3 g/dl  . Globulin, Total 09/14/2017 3.9  2.2 - 3.9 g/dL Final  . Albumin/Glob SerPl 09/14/2017 0.9  0.7 - 1.7 Final  . IFE 1 09/14/2017 Comment   Final   Comment: Immunofixation shows IgG monoclonal protein with kappa light chain specificity.   . Please Note 09/14/2017 Comment   Final   Comment: Protein electrophoresis scan will follow via computer, mail, or courier delivery.      STUDIES: He continues in remission, with no observed M spike.  We are repeating the kappa and lambda light chains today  ASSESSMENT: 67 y.o. [place] man with a history of multiple myeloma diagnosed February of 2012, with an initial M-spike  of 2.6 g/dL, IFE showing an IgG lambda paraprotein and free lambda light chains in the urine. Cytogenetics showed trisomy 40. Bone marrow biopsy showed a 22% plasmacytosis. Treated with   (1) bortezomib (subcutaneously), lenalidomide, and dexamethasone, with repeat bone marrow biopsy May of 2012 showing 10% plasmacytosis   (2) high-dose chemotherapy with BCNU and melphalan at [facility], followed by stem cell rescue July of 2012   (3) on zoledronic acid started December of 2012, initially monthly, currently given every 3 months, most recent dose  12/07/2015  (4) low-dose lenalidomide resumed April 2013, interrupted several times.  Resumed again on 02/19/2013, ata dose of 5 mg daily, 21 days on and 7 days off, later further reduced to 7 days on, 7 days off  (5) CNS symptoms and abnormal brain MRI extensively evaluated by neurology with no definitive diagnosis established  (6) rising M spike noted June 2014 but did not meet criteria for  carfilzomib study  (7) on lenalidomide 25 mg daily, 14 days on, 7 days off, starting 04/18/2013, interrupted December 2014 because of rash;   (a) resumed January 2015 at 10 mg/ day at 21 days on/ 7 days off  ([place]) starting 08/18/2014 decreased to 10 mg/ day 14 days on, 7 days off because of cytopenias  (c) as of February 2016 was on 5 mg daily 7 days on 7 days off  (d) lenalidomide discontinued December 2016 with evidence of disease progression  (8) transient global amnesia 05/29/2015, resolved without intervention  (9) starting PVD 10/25/2015 w ASA 325 thromboprophylaxis, valacyclovir prophylaxis, last dose 12/17/2015  (a) pomalidomide 4 mg/d days 1-14  ([place]) bortezomib sQ days 2,5,9,12 of each 21 day cycle  (c) dexamethasome 20 mg two days a week  (10) metapneumovirus pneumonia April 2017  (a) completing course of steroids and week of bactrim mid April 2017  (11) status post second autologous transplant at [facility] 02/04/2016(preparatory regimen melphalan 200 mg/m)  (a) received twelve-month vaccinations 03/14/2017 (DPT, Haemophilus, Pneumovax 13, polio)  ([place]) 14 months injections 05/04/2016 include DTaP, Hib conjugate, HepB energex [place] 20 mcg/ml, Prevnar 13  (c) 24 month vaccines due at [facility] June 2019  (12) maintenance therapy started November 2017, consisting of  (a) bortezomib 1.3 mg/M2 QOW, first dose 07/27/2016  ([place]) pomalidomide 1 mg days 1-21 Q28 days, started 07/19/2016  (c) zolendronate monthly started 07/27/2016 (previously Q 3 months) however patient unable to tolerate, and changed back to q3 month in April, 2018   (d) Bortezomib changed to monthly as of June 2018 because of tolerance issues, however changed back to every 2 weeks in January, 2019 due to slight elevation in M-Spike  PLAN:  Andre [name] is doing well today.  I reviewed his CBC with him which was normal today.  He will proceed with Bortezomib today.  He and I reviewed that in his myeloma panel he had a low but detectable M Spike  of 0.2.  We will await his results today.  In regards to the itching, I recommended he try taking Atarax TID PRN instead of Benadryl.  He is agreeable.  He will continue with Bortezomib every 2 weeks.  He will review the pomalidomide with Dr. [name] at his next appointment prior to starting the pomalidomide back when he returns in 4 weeks.  He will see Dr. Alvie Heidelberg again sometime in the summer.  Andre [name] will call with any issues that may develop before his next appointment with [place].    A total of (30) minutes of face-to-face time was spent  with this patient with greater than 50% of that time in counseling and care-coordination.   Wilber Bihari, NP  09/25/17 2:03 PM Medical Oncology and Hematology [facility] [address] [place], [place] [address] Tel. [phone]    Fax. [phone]

## 2017-09-27 ENCOUNTER — Ambulatory Visit: Payer: Commercial Managed Care - PPO

## 2017-09-27 ENCOUNTER — Ambulatory Visit: Payer: Commercial Managed Care - PPO | Admitting: Oncology

## 2017-09-27 ENCOUNTER — Other Ambulatory Visit: Payer: Self-pay | Admitting: Oncology

## 2017-09-27 ENCOUNTER — Other Ambulatory Visit: Payer: Commercial Managed Care - PPO

## 2017-10-02 ENCOUNTER — Other Ambulatory Visit: Payer: Self-pay

## 2017-10-02 MED ORDER — POMALIDOMIDE 1 MG PO CAPS: ORAL_CAPSULE | ORAL | 0 refills | Status: DC

## 2017-10-05 ENCOUNTER — Telehealth: Payer: Self-pay | Admitting: Pharmacy Technician

## 2017-10-05 NOTE — Telephone Encounter (Signed)
Oral Oncology Patient Advocate Encounter  Received notification from [place] that prior authorization renewal for Pomalyst is required.   PA submitted via phone and approved Auth # [id number] Dates of approval: 10/05/2017 through 10/05/2018  Oral Oncology Clinic will continue to follow.  Fabio Asa. Melynda Keller, [place] Patient [place] [phone] 10/05/2017 12:58 PM

## 2017-10-08 ENCOUNTER — Other Ambulatory Visit: Payer: Self-pay | Admitting: Oncology

## 2017-10-08 NOTE — Progress Notes (Signed)
Andre [name] was evaluated at [facility] and was told we should cancel his appts here, doubtless in preparation for a trial.  I am going to schedule him to see me sometime in March just to not lose touch but I expect he will likely be treated primarily at [facility] at least for the next foreseeable future.

## 2017-10-10 ENCOUNTER — Telehealth: Payer: Self-pay | Admitting: Oncology

## 2017-10-10 NOTE — Telephone Encounter (Signed)
Patient wanted to cancel

## 2017-10-11 ENCOUNTER — Other Ambulatory Visit: Payer: Commercial Managed Care - PPO

## 2017-10-11 ENCOUNTER — Ambulatory Visit: Payer: Commercial Managed Care - PPO

## 2017-10-11 ENCOUNTER — Ambulatory Visit: Payer: Commercial Managed Care - PPO | Admitting: Oncology

## 2017-10-16 ENCOUNTER — Other Ambulatory Visit: Payer: Self-pay | Admitting: Oncology

## 2017-10-16 ENCOUNTER — Telehealth: Payer: Self-pay | Admitting: Oncology

## 2017-10-16 DIAGNOSIS — C9000 Multiple myeloma not having achieved remission: Secondary | ICD-10-CM

## 2017-10-16 NOTE — Telephone Encounter (Signed)
Scheduled appt per 1/28 sch msg - spoke with patient regarding appts.

## 2017-10-18 ENCOUNTER — Other Ambulatory Visit: Payer: Self-pay | Admitting: Oncology

## 2017-10-18 ENCOUNTER — Other Ambulatory Visit: Payer: Self-pay | Admitting: *Deleted

## 2017-10-18 NOTE — Progress Notes (Signed)
[name] is being considered for a venetoclax trial at [facility]. We're rechenking his SPEP 2/12 toi assess elegibility

## 2017-10-30 ENCOUNTER — Inpatient Hospital Stay: Payer: Commercial Managed Care - PPO | Attending: Oncology

## 2017-10-30 DIAGNOSIS — C9000 Multiple myeloma not having achieved remission: Secondary | ICD-10-CM | POA: Insufficient documentation

## 2017-10-30 LAB — CBC WITH DIFFERENTIAL/PLATELET
BASOS ABS: 0 10*3/uL (ref 0.0–0.1)
BASOS PCT: 0 %
Eosinophils Absolute: 0.2 10*3/uL (ref 0.0–0.5)
Eosinophils Relative: 4 %
HEMATOCRIT: 39.8 % (ref 38.4–49.9)
HEMOGLOBIN: 13.6 g/dL (ref 13.0–17.1)
Lymphocytes Relative: 36 %
Lymphs Abs: 1.9 10*3/uL (ref 0.9–3.3)
MCH: 36.3 pg — ABNORMAL HIGH (ref 27.2–33.4)
MCHC: 34.2 g/dL (ref 32.0–36.0)
MCV: 106.1 fL — ABNORMAL HIGH (ref 79.3–98.0)
MONOS PCT: 13 %
Monocytes Absolute: 0.7 10*3/uL (ref 0.1–0.9)
NEUTROS ABS: 2.5 10*3/uL (ref 1.5–6.5)
NEUTROS PCT: 47 %
Platelets: 153 10*3/uL (ref 140–400)
RBC: 3.75 MIL/uL — AB (ref 4.20–5.82)
RDW: 14.3 % (ref 11.0–14.6)
WBC: 5.3 10*3/uL (ref 4.0–10.3)

## 2017-10-30 LAB — COMPREHENSIVE METABOLIC PANEL
ALBUMIN: 4 g/dL (ref 3.5–5.0)
ALT: 22 U/L (ref 0–55)
AST: 26 U/L (ref 5–34)
Alkaline Phosphatase: 78 U/L (ref 40–150)
Anion gap: 10 (ref 3–11)
BILIRUBIN TOTAL: 0.6 mg/dL (ref 0.2–1.2)
BUN: 26 mg/dL (ref 7–26)
CALCIUM: 9.9 mg/dL (ref 8.4–10.4)
CO2: 23 mmol/L (ref 22–29)
Chloride: 103 mmol/L (ref 98–109)
Creatinine, Ser: 1.08 mg/dL (ref 0.70–1.30)
GFR calc Af Amer: >60 mL/min (ref >60)
GFR calc non Af Amer: >60 mL/min (ref >60)
GLUCOSE: 79 mg/dL (ref 70–140)
Potassium: 4.4 mmol/L (ref 3.5–5.1)
Sodium: 136 mmol/L (ref 136–145)
TOTAL PROTEIN: 7.9 g/dL (ref 6.4–8.3)

## 2017-10-31 LAB — KAPPA/LAMBDA LIGHT CHAINS
KAPPA FREE LGHT CHN: 41 mg/L — AB (ref 3.3–19.4)
Kappa, lambda light chain ratio: 3.06 — ABNORMAL HIGH (ref 0.26–1.65)
Lambda free light chains: 13.4 mg/L (ref 5.7–26.3)

## 2017-11-01 ENCOUNTER — Other Ambulatory Visit: Payer: Self-pay | Admitting: Oncology

## 2017-11-02 LAB — MULTIPLE MYELOMA PANEL, SERUM
ALBUMIN/GLOB SERPL: 1.1 (ref 0.7–1.7)
ALPHA 1: 0.2 g/dL (ref 0.0–0.4)
ALPHA2 GLOB SERPL ELPH-MCNC: 0.8 g/dL (ref 0.4–1.0)
Albumin SerPl Elph-Mcnc: 3.7 g/dL (ref 2.9–4.4)
B-Globulin SerPl Elph-Mcnc: 1.2 g/dL (ref 0.7–1.3)
GLOBULIN, TOTAL: 3.7 g/dL (ref 2.2–3.9)
Gamma Glob SerPl Elph-Mcnc: 1.6 g/dL (ref 0.4–1.8)
IGG (IMMUNOGLOBIN G), SERUM: 1680 mg/dL — AB (ref 700–1600)
IGM (IMMUNOGLOBULIN M), SRM: 39 mg/dL (ref 20–172)
IgA: 160 mg/dL (ref 61–437)
M Protein SerPl Elph-Mcnc: 0.3 g/dL — ABNORMAL HIGH
Total Protein ELP: 7.4 g/dL (ref 6.0–8.5)

## 2017-11-05 ENCOUNTER — Other Ambulatory Visit: Payer: Self-pay | Admitting: Oncology

## 2017-11-05 ENCOUNTER — Encounter: Payer: Self-pay | Admitting: Oncology

## 2017-11-06 ENCOUNTER — Encounter: Payer: Self-pay | Admitting: Adult Health

## 2017-11-20 ENCOUNTER — Inpatient Hospital Stay: Payer: Commercial Managed Care - PPO | Attending: Oncology

## 2017-11-20 DIAGNOSIS — Z9481 Bone marrow transplant status: Secondary | ICD-10-CM

## 2017-11-20 DIAGNOSIS — T451X5A Adverse effect of antineoplastic and immunosuppressive drugs, initial encounter: Secondary | ICD-10-CM

## 2017-11-20 DIAGNOSIS — C9000 Multiple myeloma not having achieved remission: Secondary | ICD-10-CM | POA: Diagnosis present

## 2017-11-20 DIAGNOSIS — R9089 Other abnormal findings on diagnostic imaging of central nervous system: Secondary | ICD-10-CM

## 2017-11-20 DIAGNOSIS — D701 Agranulocytosis secondary to cancer chemotherapy: Secondary | ICD-10-CM

## 2017-11-20 LAB — COMPREHENSIVE METABOLIC PANEL
ALBUMIN: 3.7 g/dL (ref 3.5–5.0)
ALK PHOS: 77 U/L (ref 40–150)
ALT: 21 U/L (ref 0–55)
ANION GAP: 8 (ref 3–11)
AST: 25 U/L (ref 5–34)
BILIRUBIN TOTAL: 0.4 mg/dL (ref 0.2–1.2)
BUN: 22 mg/dL (ref 7–26)
CALCIUM: 10 mg/dL (ref 8.4–10.4)
CO2: 23 mmol/L (ref 22–29)
CREATININE: 1.04 mg/dL (ref 0.70–1.30)
Chloride: 106 mmol/L (ref 98–109)
GFR calc Af Amer: >60 mL/min (ref >60)
GFR calc non Af Amer: >60 mL/min (ref >60)
GLUCOSE: 94 mg/dL (ref 70–140)
Potassium: 3.8 mmol/L (ref 3.5–5.1)
Sodium: 137 mmol/L (ref 136–145)
TOTAL PROTEIN: 7.8 g/dL (ref 6.4–8.3)

## 2017-11-20 LAB — CBC WITH DIFFERENTIAL/PLATELET
BASOS PCT: 1 %
Basophils Absolute: 0 10*3/uL (ref 0.0–0.1)
Eosinophils Absolute: 0.1 10*3/uL (ref 0.0–0.5)
Eosinophils Relative: 3 %
HEMATOCRIT: 39.8 % (ref 38.4–49.9)
HEMOGLOBIN: 13.7 g/dL (ref 13.0–17.1)
Lymphocytes Relative: 31 %
Lymphs Abs: 1.4 10*3/uL (ref 0.9–3.3)
MCH: 36.8 pg — ABNORMAL HIGH (ref 27.2–33.4)
MCHC: 34.4 g/dL (ref 32.0–36.0)
MCV: 107.2 fL — ABNORMAL HIGH (ref 79.3–98.0)
MONOS PCT: 11 %
Monocytes Absolute: 0.5 10*3/uL (ref 0.1–0.9)
NEUTROS ABS: 2.5 10*3/uL (ref 1.5–6.5)
NEUTROS PCT: 54 %
Platelets: 147 10*3/uL (ref 140–400)
RBC: 3.71 MIL/uL — ABNORMAL LOW (ref 4.20–5.82)
RDW: 14.5 % (ref 11.0–14.6)
WBC: 4.6 10*3/uL (ref 4.0–10.3)

## 2017-11-21 LAB — KAPPA/LAMBDA LIGHT CHAINS
KAPPA FREE LGHT CHN: 34.8 mg/L — AB (ref 3.3–19.4)
Kappa, lambda light chain ratio: 2.9 — ABNORMAL HIGH (ref 0.26–1.65)
Lambda free light chains: 12 mg/L (ref 5.7–26.3)

## 2017-11-22 NOTE — Progress Notes (Signed)
[place]  [phone] [phone]    ID: Andre [name]  DOB: 04-19-1951  MR#: [id number] CSN#: [id number]   Patient Care Team: Cassandria Anger, MD as PCP - General (Internal Medicine) Magrinat, [name], MD as Consulting Physician (Oncology) Concepcion Living, MD as Consulting Physician (Neurology) [name], MD as Consulting Physician (Internal Medicine) Kathrynn Ducking, MD as Consulting Physician (Neurology) Dorna Bloom, DDS as Referring Physician (Oral Surgery)   CHIEF COMPLAINT: Multiple myeloma  CURRENT TREATMENT: Maintenance therapy with bortezomib, zolendronate, and pomalidomide  INTERVAL HISTORY:  Andre [name] returns today for follow-up and treatment of his multiple myeloma.  He is currently off treatment and we are observing his M spike so that when it rises to 0.5 treatment can be reinstituted.   Andre [name] also receives zolendronate every 3 months. He also tolerates this well.  He is due for a dose   REVIEW OF SYSTEMS: Andre [name] reports that he feels better than ever. He has improved his diet by eating less carbohydrates, and becoming a vegetarian. He has increased his spiritual mediation and has adopted the Wim-Hof method of deep breathing and Cold Therapy. He takes cold showers for 3 minutes, which invigorates him. He has also lost about 8 lbs which is a benefit from this therapy. He has also increased his exercise with push ups, walking 2-3 miles, and jogging. He is not currently swimming, but he works out with a Physiological scientist doing strength conditioning. He denies unusual headaches, visual changes, nausea, vomiting, or dizziness. There has been no unusual cough, phlegm production, or pleurisy. This been no change in bowel or bladder habits. He denies unexplained fatigue or unexplained weight loss, bleeding, rash, or fever. A detailed review of systems was otherwise stable.    HISTORY OF PRESENT ILLNESS:  From the  original intake nodes:  The patient was worked up for peptic ulcer disease in August of 2011, with significant bleeding and anemia.  The patient was Helicobacter pylori negative.  He received some epinephrine when he had his EGD and then started on Protonix.  [name] anemia slowly resolved so that by September, his hemoglobin was up to 10.9 and by earlier this month, his hemoglobin was up to 12.5.    As part of his general workup, he was found to have a slightly elevated globulin fraction.  In September, his total protein was 8.3 with an albumin of 3.8.  In January, the total protein was 8.4 and albumin 3.6.  With persistence of this slight abnormality, Dr. Olevia Perches obtained serum immunofixation and SPEP.  The SPTP showed an M-spike of 2.67 grams.  A total IgG was 4,190.  Total IgA low at 28.  Total IgM low at 28 also.  The immunofixation showed a monoclonal IgG lambda paraprotein.  There were also monoclonal free lambda light chains present. With this information, the patient was referred for further evaluation.   A diagnosis of myeloma was confirmed by bone marrow biopsy and subsequebnt treatment is as detailed below  PAST MEDICAL HISTORY: Past Medical History:  Diagnosis Date  . Allergy   . Anemia   . Arthritis   . Asthma    no treatment x 20 years  . Depression   . Double vision    occurs at times   . Duodenal ulcer   . GERD (gastroesophageal reflux disease)   . Hyperlipidemia   . Hypertension   . Hypothyroidism   . Multiple myeloma 07/04/2011  . Thyroid disease  Significant for peptic ulcer disease as noted above.  History of hyperlipidemia.  History of anxiety and depression.  History of GERD which is significantly improved with weight loss and history of reactive airway disease, possibly secondary to the GERD which also improved with weight loss.  There was a history of obesity, now much improved secondary to exercise and diet.   PAST SURGICAL HISTORY: Past Surgical History:    Procedure Laterality Date  . BONE MARROW TRANSPLANT  2011   for MM  . CARDIOLITE STUDY  11/25/2003   NORMAL  . TONSILLECTOMY      FAMILY HISTORY Family History  Problem Relation Age of Onset  . Throat cancer Mother   . Hypertension Father   . Stroke Father   . Asthma Father   . Diabetes Father   . Pancreatic cancer Brother   [name] father died at the age of 45 following a stroke.  [name] mother died with cancer of the throat at the age of 59.   She had a history of depression and was a smoker.  Not clear how much alcohol she drank, according to the patient.  The patient had two brothers.  One died from the age of 51 from a "kidney problem."  The second one died at the age of 44 from pancreatic cancer. (This may have been duodenal cancer. 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  His wife of 37+ years was [name], but they are in process of divorce, to be finalized August 2018. In 2016 he remarried, his current wife's name is [name].  The patient has two daughters:  [name], who works in [place], who had her second child April 2016. Both daughters live here in [place]. The patient is very close to his daughters.  [place] first daughter, [name], born 03/06/2013, apparently has Weber/Osler/Rendu. The patient is not a church attender.  Andre [name] also participates in [place] even though actually there is no alcohol or drug problem in the family.  He participates in this group and he gets quite a bit of support from it.     ADVANCED DIRECTIVES: In place  HEALTH MAINTENANCE: (updated 07/16/2013) Social History   Tobacco Use  . Smoking status: Former Smoker    Years: 3.00    Last attempt to quit: 03/27/1969    Years since quitting: 48.6  . Smokeless tobacco: Never Used  Substance Use Topics  . Alcohol use:  No  . Drug use: No    Colonoscopy:  PSA:  Sept 2014, normal/Dr. [name]  Lipid panel: Sept 2014/Dr. [name]  Allergies  Allergen Reactions  . Atorvastatin Other (See Comments)  . Rosuvastatin     Other reaction(s): Liver Disorder  . Crestor [Rosuvastatin Calcium]     ADVERSE EFFECTS ON LIVER  . Lipitor [Atorvastatin Calcium]   . Septra [Sulfamethoxazole-Trimethoprim] Rash   Current Outpatient Medications  Medication Sig Dispense Refill  . aspirin EC 81 MG tablet Take 1 tablet (81 mg total) by mouth daily. 100 tablet 3  . Nathanuel complex vitamins tablet Take 1 tablet by mouth daily. 100 tablet 3  . cetirizine (ZYRTEC) 10 MG tablet Take 10 mg by mouth daily.    . Cholecalciferol (VITAMIN D3) 2000 units capsule Take 1 capsule (2,000 Units total) by mouth daily. 100 capsule 3  . Cholecalciferol (VITAMIN  D3) 50000 units CAPS Take 1 capsule by mouth once a week. 6 capsule 0  . docusate sodium (COLACE) 100 MG capsule Take 100 mg by mouth daily as needed for mild constipation or moderate constipation.    [name] escitalopram (LEXAPRO) 10 MG tablet TAKE 1 TABLET BY MOUTH  DAILY 90 tablet 3  . escitalopram (LEXAPRO) 10 MG tablet take 1 tablet by mouth once daily 30 tablet 11  . feeding supplement, ENSURE ENLIVE, (ENSURE ENLIVE) LIQD Take 237 mLs by mouth 2 (two) times daily between meals. 237 mL 12  . hydrOXYzine (ATARAX/VISTARIL) 25 MG tablet Take 1 tablet (25 mg total) by mouth 3 (three) times daily as needed. 30 tablet 0  . levothyroxine (SYNTHROID, LEVOTHROID) 150 MCG tablet TAKE 1 TABLET BY MOUTH  EVERY MORNING ON AN EMPTY  STOMACH 90 tablet 3  . ondansetron (ZOFRAN) 8 MG tablet Take 1 tablet (8 mg total) by mouth every 8 (eight) hours as needed for nausea or vomiting. 20 tablet 0  . pantoprazole (PROTONIX) 40 MG tablet TAKE 1 TABLET BY MOUTH  TWICE A DAY BEFORE MEALS 180 tablet 3  . ranitidine (ZANTAC) 150 MG tablet Take 150 mg by mouth 2 (two) times daily.    . sildenafil (VIAGRA) 25 MG tablet  Take 1 tablet (25 mg total) by mouth daily as needed for erectile dysfunction. 10 tablet 0  . triamcinolone cream (KENALOG) 0.5 % Apply 1 application topically 4 (four) times daily. 60 g 2  . valACYclovir (VALTREX) 1000 MG tablet Take 1 tablet (1,000 mg total) by mouth daily. 90 tablet 3  . zolendronic acid (ZOMETA) 4 MG/5ML injection Inject 4 mg into the vein every 3 (three) months.     No current facility-administered medications for this visit.    Objective: Middle-aged white man who appears well  Vitals:   11/23/17 1004  BP: 138/80  Pulse: 62  Resp: 18  Temp: 97.8 F (36.6 C)  SpO2: 100%  Body mass index is 26.71 kg/m. Filed Weights   11/23/17 1004  Weight: 180 lb 14.4 oz (82.1 kg)   Sclerae unicteric, EOMs intact No cervical or supraclavicular adenopathy Lungs no rales or rhonchi Heart regular rate and rhythm Abd soft, nontender, positive bowel sounds MSK no focal spinal tenderness, no upper extremity lymphedema Neuro: nonfocal, well oriented, appropriate affect        No visits with results within 1 Day(s) from this visit.  Latest known visit with results is:  Appointment on 11/20/2017  Component Date Value Ref Range Status  . WBC 11/20/2017 4.6  4.0 - 10.3 K/uL Final  . RBC 11/20/2017 3.71* 4.20 - 5.82 MIL/uL Final  . Hemoglobin 11/20/2017 13.7  13.0 - 17.1 g/dL Final  . HCT 11/20/2017 39.8  38.4 - 49.9 % Final  . MCV 11/20/2017 107.2* 79.3 - 98.0 fL Final  . MCH 11/20/2017 36.8* 27.2 - 33.4 pg Final  . MCHC 11/20/2017 34.4  32.0 - 36.0 g/dL Final  . RDW 11/20/2017 14.5  11.0 - 14.6 % Final  . Platelets 11/20/2017 147  140 - 400 K/uL Final  . Neutrophils Relative % 11/20/2017 54  % Final  . Neutro Abs 11/20/2017 2.5  1.5 - 6.5 K/uL Final  . Lymphocytes Relative 11/20/2017 31  % Final  . Lymphs Abs 11/20/2017 1.4  0.9 - 3.3 K/uL Final  . Monocytes Relative 11/20/2017 11  % Final  . Monocytes Absolute 11/20/2017 0.5  0.1 - 0.9 K/uL Final  . Eosinophils  Relative 11/20/2017  3  % Final  . Eosinophils Absolute 11/20/2017 0.1  0.0 - 0.5 K/uL Final  . Basophils Relative 11/20/2017 1  % Final  . Basophils Absolute 11/20/2017 0.0  0.0 - 0.1 K/uL Final   Performed at [facility], [place] [address] [place], [place] [address]  . Sodium 11/20/2017 137  136 - 145 mmol/L Final  . Potassium 11/20/2017 3.8  3.5 - 5.1 mmol/L Final  . Chloride 11/20/2017 106  98 - 109 mmol/L Final  . CO2 11/20/2017 23  22 - 29 mmol/L Final  . Glucose, Bld 11/20/2017 94  70 - 140 mg/dL Final  . BUN 11/20/2017 22  7 - 26 mg/dL Final  . Creatinine, Ser 11/20/2017 1.04  0.70 - 1.30 mg/dL Final  . Calcium 11/20/2017 10.0  8.4 - 10.4 mg/dL Final  . Total Protein 11/20/2017 7.8  6.4 - 8.3 g/dL Final  . Albumin 11/20/2017 3.7  3.5 - 5.0 g/dL Final  . AST 11/20/2017 25  5 - 34 U/L Final  . ALT 11/20/2017 21  0 - 55 U/L Final  . Alkaline Phosphatase 11/20/2017 77  40 - 150 U/L Final  . Total Bilirubin 11/20/2017 0.4  0.2 - 1.2 mg/dL Final  . GFR calc non Af Amer 11/20/2017 >60  >60 mL/min Final  . GFR calc Af Amer 11/20/2017 >60  >60 mL/min Final   Comment: (NOTE) The eGFR has been calculated using the CKD EPI equation. This calculation has not been validated in all clinical situations. eGFR's persistently <60 mL/min signify possible Chronic Kidney Disease.   Georgiann Hahn gap 11/20/2017 8  3 - 11 Final   Performed at [facility], [place] [address] [place], [place] [address]  . Kappa free light chain 11/20/2017 34.8* 3.3 - 19.4 mg/L Final  . Lamda free light chains 11/20/2017 12.0  5.7 - 26.3 mg/L Final  . Kappa, lamda light chain ratio 11/20/2017 2.90* 0.26 - 1.65 Final   Comment: (NOTE) Performed At: [facility] [place], [place] [id number] Rush Farmer MD [id number] Performed at [facility], [place] [address], [place], [place] [address]      STUDIES: Unfortunately his  SPEP results are not yet ready.  ASSESSMENT: 67 y.o. [place] man with a history of multiple myeloma diagnosed February of 2012, with an initial M-spike of 2.6 g/dL, IFE showing an IgG lambda paraprotein and free lambda light chains in the urine. Cytogenetics showed trisomy 57. Bone marrow biopsy showed a 22% plasmacytosis. Treated with   (1) bortezomib (subcutaneously), lenalidomide, and dexamethasone, with repeat bone marrow biopsy May of 2012 showing 10% plasmacytosis   (2) high-dose chemotherapy with BCNU and melphalan at [facility], followed by stem cell rescue July of 2012   (3) on zoledronic acid started December of 2012, initially monthly, currently given every 3 months, most recent dose  12/07/2015  (4) low-dose lenalidomide resumed April 2013, interrupted several times.  Resumed again on 02/19/2013, ata dose of 5 mg daily, 21 days on and 7 days off, later further reduced to 7 days on, 7 days off  (5) CNS symptoms and abnormal brain MRI extensively evaluated by neurology with no definitive diagnosis established  (6) rising M spike noted June 2014 but did not meet criteria for carfilzomib study  (7) on lenalidomide 25 mg daily, 14 days on, 7 days off, starting 04/18/2013, interrupted December 2014 because of rash;   (a) resumed January 2015 at 10 mg/ day at 33  days on/ 7 days off  (Masin) starting 08/18/2014 decreased to 10 mg/ day 14 days on, 7 days off because of cytopenias  (c) as of February 2016 was on 5 mg daily 7 days on 7 days off  (d) lenalidomide discontinued December 2016 with evidence of disease progression  (8) transient global amnesia 05/29/2015, resolved without intervention  (9) starting PVD 10/25/2015 w ASA 325 thromboprophylaxis, valacyclovir prophylaxis, last dose 12/17/2015  (a) pomalidomide 4 mg/d days 1-14  ([place]) bortezomib sQ days 2,5,9,12 of each 21 day cycle  (c) dexamethasome 20 mg two days a week  (d) dexamethasone bortezomib and pomalidomide discontinued late  December 2018 with poor tolerance  (10) metapneumovirus pneumonia April 2017  (a) completing course of steroids and week of bactrim mid April 2017  (11) status post second autologous transplant at [facility] 02/04/2016(preparatory regimen melphalan 200 mg/m)  (a) received twelve-month vaccinations 03/14/2017 (DPT, Haemophilus, Pneumovax 13, polio)  (Rino) 14 months injections 05/04/2016 include DTaP, Hib conjugate, HepB energex [place] 20 mcg/ml, Prevnar 13  (c) 24 month vaccines due at [facility] June 2019  (12) maintenance therapy started November 2017, consisting of  (a) bortezomib 1.3 mg/M2 QOW, first dose 07/27/2016  (Amaad) pomalidomide 1 mg days 1-21 Q28 days, started 07/19/2016  (c) zolendronate monthly started 07/27/2016 (previously Q 3 months) however patient unable to tolerate, and changed back to q3 month in April, 2018   (d) Bortezomib changed to monthly as of June 2018 because of tolerance issues, however changed back to every 2 weeks in January, 2019 due to slight elevation in M-Spike  PLAN:  Andre [name] is feeling terrific and is enjoying his treatment medication.  He has an excellent exercise program, and diet.  He is doing better at work.  Overall I am delighted that he is doing so well.  He knows this is not going to last and we are ready beginning to discuss what his next step will be.  He is having some concerns regarding participating in studies although I continue to encourage him to do that.  He will be seeing Dr. Alvie Heidelberg in June at which time he will receive his 25-monthvaccinations.  I do believe his M spike probably will be above 0.5 at that time in which case he can discuss what studies are available there.  If he decides again participating in a study we will treat him here with daratumumab.  We began the discussion regarding that complex drug today.  He does need a bone survey and also a DEXA scan.  He also needs to get back on zolendronate, which he will receive next week.  He knows to  call for any issues that may develop before the next visit.  Magrinat, GVirgie Dad MD  11/23/17 10:37 AM Medical Oncology and Hematology [facility]AConcordia [place] [address]Tel. [phone]   Fax. [phone] This document serves as a record of services personally performed by GLurline Del MD. It was created on his behalf by ASheron Nightingale a trained medical scribe. The creation of this record is based on the scribe's personal observations and the provider's statements to them.   I have reviewed the above documentation for accuracy and completeness, and I agree with the above.

## 2017-11-23 ENCOUNTER — Inpatient Hospital Stay (HOSPITAL_BASED_OUTPATIENT_CLINIC_OR_DEPARTMENT_OTHER): Payer: Commercial Managed Care - PPO | Admitting: Oncology

## 2017-11-23 ENCOUNTER — Telehealth: Payer: Self-pay | Admitting: Oncology

## 2017-11-23 VITALS — BP 138/80 | HR 62 | Temp 97.8°F | Resp 18 | Ht 69.0 in | Wt 180.9 lb

## 2017-11-23 DIAGNOSIS — C9000 Multiple myeloma not having achieved remission: Secondary | ICD-10-CM

## 2017-11-23 DIAGNOSIS — Z9481 Bone marrow transplant status: Secondary | ICD-10-CM

## 2017-11-23 LAB — MULTIPLE MYELOMA PANEL, SERUM
ALBUMIN/GLOB SERPL: 1.2 (ref 0.7–1.7)
ALPHA 1: 0.2 g/dL (ref 0.0–0.4)
Albumin SerPl Elph-Mcnc: 3.7 g/dL (ref 2.9–4.4)
Alpha2 Glob SerPl Elph-Mcnc: 0.7 g/dL (ref 0.4–1.0)
B-GLOBULIN SERPL ELPH-MCNC: 1.1 g/dL (ref 0.7–1.3)
Gamma Glob SerPl Elph-Mcnc: 1.3 g/dL (ref 0.4–1.8)
Globulin, Total: 3.3 g/dL (ref 2.2–3.9)
IGG (IMMUNOGLOBIN G), SERUM: 1361 mg/dL (ref 700–1600)
IGM (IMMUNOGLOBULIN M), SRM: 31 mg/dL (ref 20–172)
IgA: 138 mg/dL (ref 61–437)
M PROTEIN SERPL ELPH-MCNC: 0.5 g/dL — AB
Total Protein ELP: 7 g/dL (ref 6.0–8.5)

## 2017-11-23 NOTE — Telephone Encounter (Signed)
Gave avs and calendar for march and april °

## 2017-11-25 ENCOUNTER — Encounter: Payer: Self-pay | Admitting: Oncology

## 2017-11-26 ENCOUNTER — Other Ambulatory Visit: Payer: Self-pay | Admitting: Oncology

## 2017-11-26 DIAGNOSIS — C9000 Multiple myeloma not having achieved remission: Secondary | ICD-10-CM

## 2017-11-26 DIAGNOSIS — Z9481 Bone marrow transplant status: Secondary | ICD-10-CM

## 2017-11-26 NOTE — Progress Notes (Signed)
START ON PATHWAY REGIMEN - Multiple Myeloma and Other Plasma Cell Dyscrasias     A cycle is every 28 days:     Daratumumab   **Always confirm dose/schedule in your pharmacy ordering system**    Patient Characteristics: Relapsed / Refractory, All Lines of Therapy R-ISS Staging: III Disease Classification: Relapsed Line of Therapy: Third Line Intent of Therapy: Non-Curative / Palliative Intent, Discussed with Patient

## 2017-11-26 NOTE — Progress Notes (Signed)
I called Andre [name] with the results of his most recent M spike.  I also alerted Dr. Alvie Heidelberg at [facility].

## 2017-11-30 ENCOUNTER — Ambulatory Visit
Admission: RE | Admit: 2017-11-30 | Discharge: 2017-11-30 | Disposition: A | Discharge status: Home / Self Care | Payer: Commercial Managed Care - PPO | Source: Ambulatory Visit | Attending: Oncology | Admitting: Oncology

## 2017-11-30 ENCOUNTER — Other Ambulatory Visit: Payer: Self-pay | Admitting: Oncology

## 2017-11-30 DIAGNOSIS — C9000 Multiple myeloma not having achieved remission: Secondary | ICD-10-CM

## 2017-11-30 DIAGNOSIS — Z9481 Bone marrow transplant status: Secondary | ICD-10-CM

## 2017-12-07 ENCOUNTER — Ambulatory Visit (HOSPITAL_COMMUNITY)
Admission: RE | Admit: 2017-12-07 | Discharge: 2017-12-07 | Disposition: A | Discharge status: Home / Self Care | Payer: Commercial Managed Care - PPO | Source: Ambulatory Visit | Attending: Oncology | Admitting: Oncology

## 2017-12-07 DIAGNOSIS — M899 Disorder of bone, unspecified: Secondary | ICD-10-CM | POA: Diagnosis not present

## 2017-12-07 DIAGNOSIS — C9000 Multiple myeloma not having achieved remission: Secondary | ICD-10-CM | POA: Diagnosis present

## 2017-12-17 ENCOUNTER — Inpatient Hospital Stay: Payer: Commercial Managed Care - PPO | Attending: Oncology

## 2017-12-17 ENCOUNTER — Inpatient Hospital Stay: Payer: Commercial Managed Care - PPO

## 2017-12-17 VITALS — BP 118/65 | HR 68 | Resp 16

## 2017-12-17 DIAGNOSIS — Z9481 Bone marrow transplant status: Secondary | ICD-10-CM

## 2017-12-17 DIAGNOSIS — Z87891 Personal history of nicotine dependence: Secondary | ICD-10-CM | POA: Insufficient documentation

## 2017-12-17 DIAGNOSIS — Z9484 Stem cells transplant status: Secondary | ICD-10-CM | POA: Insufficient documentation

## 2017-12-17 DIAGNOSIS — C9002 Multiple myeloma in relapse: Secondary | ICD-10-CM

## 2017-12-17 DIAGNOSIS — C9000 Multiple myeloma not having achieved remission: Secondary | ICD-10-CM

## 2017-12-17 DIAGNOSIS — Z79899 Other long term (current) drug therapy: Secondary | ICD-10-CM | POA: Insufficient documentation

## 2017-12-17 DIAGNOSIS — Z5112 Encounter for antineoplastic immunotherapy: Secondary | ICD-10-CM | POA: Insufficient documentation

## 2017-12-17 DIAGNOSIS — D701 Agranulocytosis secondary to cancer chemotherapy: Secondary | ICD-10-CM

## 2017-12-17 DIAGNOSIS — T451X5A Adverse effect of antineoplastic and immunosuppressive drugs, initial encounter: Secondary | ICD-10-CM

## 2017-12-17 DIAGNOSIS — R9089 Other abnormal findings on diagnostic imaging of central nervous system: Secondary | ICD-10-CM

## 2017-12-17 LAB — COMPREHENSIVE METABOLIC PANEL
ALBUMIN: 3.6 g/dL (ref 3.5–5.0)
ALT: 26 U/L (ref 0–55)
ANION GAP: 7 (ref 3–11)
AST: 25 U/L (ref 5–34)
Alkaline Phosphatase: 86 U/L (ref 40–150)
BUN: 23 mg/dL (ref 7–26)
CO2: 24 mmol/L (ref 22–29)
Calcium: 9.9 mg/dL (ref 8.4–10.4)
Chloride: 108 mmol/L (ref 98–109)
Creatinine, Ser: 0.92 mg/dL (ref 0.70–1.30)
GFR calc Af Amer: >60 mL/min (ref >60)
GFR calc non Af Amer: >60 mL/min (ref >60)
GLUCOSE: 95 mg/dL (ref 70–140)
POTASSIUM: 4 mmol/L (ref 3.5–5.1)
SODIUM: 139 mmol/L (ref 136–145)
TOTAL PROTEIN: 7.3 g/dL (ref 6.4–8.3)
Total Bilirubin: 0.3 mg/dL (ref 0.2–1.2)

## 2017-12-17 LAB — CBC WITH DIFFERENTIAL/PLATELET
BASOS ABS: 0 10*3/uL (ref 0.0–0.1)
Basophils Relative: 0 %
Eosinophils Absolute: 0.1 10*3/uL (ref 0.0–0.5)
Eosinophils Relative: 3 %
HEMATOCRIT: 38.7 % (ref 38.4–49.9)
HEMOGLOBIN: 13.3 g/dL (ref 13.0–17.1)
LYMPHS PCT: 29 %
Lymphs Abs: 1.4 10*3/uL (ref 0.9–3.3)
MCH: 36.9 pg — ABNORMAL HIGH (ref 27.2–33.4)
MCHC: 34.2 g/dL (ref 32.0–36.0)
MCV: 107.6 fL — ABNORMAL HIGH (ref 79.3–98.0)
MONO ABS: 0.6 10*3/uL (ref 0.1–0.9)
Monocytes Relative: 12 %
NEUTROS ABS: 2.7 10*3/uL (ref 1.5–6.5)
Neutrophils Relative %: 56 %
Platelets: 143 10*3/uL (ref 140–400)
RBC: 3.6 MIL/uL — AB (ref 4.20–5.82)
RDW: 13.7 % (ref 11.0–14.6)
WBC: 4.9 10*3/uL (ref 4.0–10.3)

## 2017-12-17 MED ORDER — ZOLEDRONIC ACID 4 MG/100ML IV SOLN
4.0000 mg | Freq: Once | INTRAVENOUS | Status: AC
  Administered 2017-12-17: 4 mg via INTRAVENOUS
  Filled 2017-12-17: qty 100

## 2017-12-17 MED ORDER — ZOLEDRONIC ACID 4 MG/5ML IV CONC
4.0000 mg | Freq: Once | INTRAVENOUS | Status: DC
  Filled 2017-12-17: qty 5

## 2017-12-17 NOTE — Patient Instructions (Signed)

## 2017-12-18 LAB — KAPPA/LAMBDA LIGHT CHAINS
KAPPA FREE LGHT CHN: 36.9 mg/L — AB (ref 3.3–19.4)
KAPPA, LAMDA LIGHT CHAIN RATIO: 2.8 — AB (ref 0.26–1.65)
LAMDA FREE LIGHT CHAINS: 13.2 mg/L (ref 5.7–26.3)

## 2017-12-20 LAB — MULTIPLE MYELOMA PANEL, SERUM
ALBUMIN SERPL ELPH-MCNC: 3.6 g/dL (ref 2.9–4.4)
ALPHA 1: 0.2 g/dL (ref 0.0–0.4)
ALPHA2 GLOB SERPL ELPH-MCNC: 0.7 g/dL (ref 0.4–1.0)
Albumin/Glob SerPl: 1.3 (ref 0.7–1.7)
B-GLOBULIN SERPL ELPH-MCNC: 1 g/dL (ref 0.7–1.3)
Gamma Glob SerPl Elph-Mcnc: 1.2 g/dL (ref 0.4–1.8)
Globulin, Total: 3 g/dL (ref 2.2–3.9)
IGG (IMMUNOGLOBIN G), SERUM: 1390 mg/dL (ref 700–1600)
IgA: 130 mg/dL (ref 61–437)
IgM (Immunoglobulin M), Srm: 46 mg/dL (ref 20–172)
M PROTEIN SERPL ELPH-MCNC: 0.4 g/dL — AB
TOTAL PROTEIN ELP: 6.6 g/dL (ref 6.0–8.5)

## 2017-12-20 NOTE — Progress Notes (Signed)
Andre [name]  [phone] [phone]    ID: Andre [name]  DOB: [birth year]  MR#: [id number] CSN#: [id number]   Patient Care Team: Cassandria Anger, MD as PCP - General (Internal Medicine) Salif Tay, [name], MD as Consulting Physician (Oncology) Concepcion Living, MD as Consulting Physician (Neurology) [name], MD as Consulting Physician (Internal Medicine) Kathrynn Ducking, MD as Consulting Physician (Neurology) Dorna Bloom, [place] as Referring Physician (Oral Surgery)   CHIEF COMPLAINT: Multiple myeloma  CURRENT TREATMENT: Maintenance therapy with bortezomib, zolendronate, and pomalidomide  INTERVAL HISTORY:  Andre [name] returns today for a follow-up and treatment of his multiple myeloma. He is accompanied by his wife. His labs have shown evidence of early relapse and he met with Dr. Alvie Heidelberg at [facility] in January.  At that time she felt he did not meet criteria--his relapse was minimal--but suggested he might eventually be able to participate in a trial of  Venetoclx, which is a BCL-2 inhibitor.  In the meantime he has been off Velcade since 09/14/2017 and off pomalidomide since 10/18/2017.  His counts however have not significantly changed.  His most recent M spike was still 0.4, and his kappa/lambda ratio was actually slightly lower at 2.80.  He is here today to discuss that data.    Since his last visit on 11/23/2017, Andre [name] underwent Bone Density on 11/30/2017 showing a t-score of -0.6. On 12/07/2017 he underwent a Bone Survey, showing an increased number of lesions to his skull.  He had a zolendronate dose on 12/17/2017.  He had a little bit of nausea and minimal bone aches after that treatment.    REVIEW OF SYSTEMS: Andre [name] has been doing well, overall. He reports eating better and exercising. He walks a minimum of 2 miles a day. He also uses bands for strength training. Andre [name] also meditates and has been practicing deep  breathing. He has lost some weight. Work has been great. He is planning on retiring at 67 years old, so he has been handing his work off to another person who he is slowly training to take over his place. He denies unusual headaches, visual changes, vomiting, or dizziness. There has been no unusual cough, phlegm production, or pleurisy. This been no change in bowel or bladder habits. He denies unexplained fatigue or unexplained weight loss, bleeding, rash, or fever. A detailed review of systems was otherwise noncontributory.     HISTORY OF PRESENT ILLNESS:  From the original intake nodes:  The patient was worked up for peptic ulcer disease in August of 2011, with significant bleeding and anemia.  The patient was Helicobacter pylori negative.  He received some epinephrine when he had his EGD and then started on Protonix.  [name] anemia slowly resolved so that by September, his hemoglobin was up to 10.9 and by earlier this month, his hemoglobin was up to 12.5.    As part of his general workup, he was found to have a slightly elevated globulin fraction.  In September, his total protein was 8.3 with an albumin of 3.8.  In January, the total protein was 8.4 and albumin 3.6.  With persistence of this slight abnormality, Dr. Olevia Perches obtained serum immunofixation and SPEP.  The SPTP showed an M-spike of 2.67 grams.  A total IgG was 4,190.  Total IgA low at 28.  Total IgM low at 28 also.  The immunofixation showed a monoclonal IgG lambda paraprotein.  There were also monoclonal free lambda light chains present. With this information,  the patient was referred for further evaluation.   A diagnosis of myeloma was confirmed by bone marrow biopsy and subsequebnt treatment is as detailed below  PAST MEDICAL HISTORY: Past Medical History:  Diagnosis Date  . Allergy   . Anemia   . Arthritis   . Asthma    no treatment x 20 years  . Depression   . Double vision    occurs at times   . Duodenal ulcer   .  GERD (gastroesophageal reflux disease)   . Hyperlipidemia   . Hypertension   . Hypothyroidism   . Multiple myeloma 07/04/2011  . Thyroid disease   Significant for peptic ulcer disease as noted above.  History of hyperlipidemia.  History of anxiety and depression.  History of GERD which is significantly improved with weight loss and history of reactive airway disease, possibly secondary to the GERD which also improved with weight loss.  There was a history of obesity, now much improved secondary to exercise and diet.   PAST SURGICAL HISTORY: Past Surgical History:  Procedure Laterality Date  . BONE MARROW TRANSPLANT  2011   for MM  . CARDIOLITE STUDY  11/25/2003   NORMAL  . TONSILLECTOMY      FAMILY HISTORY Family History  Problem Relation Age of Onset  . Throat cancer Mother   . Hypertension Father   . Stroke Father   . Asthma Father   . Diabetes Father   . Pancreatic cancer Brother   [name] father died at the age of 46 following a stroke.  [name] mother died with cancer of the throat at the age of 62.   She had a history of depression and was a smoker.  Not clear how much alcohol she drank, according to the patient.  The patient had two brothers.  One died from the age of 44 from a "kidney problem."  The second one died at the age of 42 from pancreatic cancer. (This may have been duodenal cancer. 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  His wife of 37+ years was [name], but they are in process of divorce, to be finalized August 2018. In 2016 he remarried, his current wife's name is [name].  The patient has two daughters:  [name], who works in [place], who had her second child April 2016. Both daughters live here in [place]. The patient is very close to his daughters.  Woo first daughter,  [name], born 03/06/2013, apparently has Weber/Osler/Rendu. The patient is not a church attender.  Andre [name] also participates in [employer] even though actually there is no alcohol or drug problem in the family.  He participates in this group and he gets quite a bit of support from it.     ADVANCED DIRECTIVES: In place  HEALTH MAINTENANCE: (updated 07/16/2013) Social History   Tobacco Use  . Smoking status: Former Smoker    Years: 3.00    Last attempt to quit: 03/27/1969    Years since quitting: 48.7  . Smokeless tobacco: Never Used  Substance Use Topics  . Alcohol use: No  . Drug use: No    Colonoscopy:  PSA:  Sept 2014, normal/Dr. [name]  Lipid panel: Sept 2014/Dr. [name]  Allergies  Allergen Reactions  . Atorvastatin Other (See Comments)  . Rosuvastatin     Other reaction(s): Liver  Disorder  . Crestor [Rosuvastatin Calcium]     ADVERSE EFFECTS ON LIVER  . Lipitor [Atorvastatin Calcium]   . Septra [Sulfamethoxazole-Trimethoprim] Rash   Current Outpatient Medications  Medication Sig Dispense Refill  . aspirin EC 81 MG tablet Take 1 tablet (81 mg total) by mouth daily. 100 tablet 3  . Habib complex vitamins tablet Take 1 tablet by mouth daily. 100 tablet 3  . cetirizine (ZYRTEC) 10 MG tablet Take 10 mg by mouth daily.    . Cholecalciferol (VITAMIN D3) 2000 units capsule Take 1 capsule (2,000 Units total) by mouth daily. 100 capsule 3  . Cholecalciferol (VITAMIN D3) 50000 units CAPS Take 1 capsule by mouth once a week. 6 capsule 0  . docusate sodium (COLACE) 100 MG capsule Take 100 mg by mouth daily as needed for mild constipation or moderate constipation.    [name] escitalopram (LEXAPRO) 10 MG tablet TAKE 1 TABLET BY MOUTH  DAILY 90 tablet 3  . escitalopram (LEXAPRO) 10 MG tablet take 1 tablet by mouth once daily 30 tablet 11  . feeding supplement, ENSURE ENLIVE, (ENSURE ENLIVE) LIQD Take 237 mLs by mouth 2 (two) times daily between meals. 237 mL 12  . hydrOXYzine (ATARAX/VISTARIL) 25 MG  tablet Take 1 tablet (25 mg total) by mouth 3 (three) times daily as needed. 30 tablet 0  . levothyroxine (SYNTHROID, LEVOTHROID) 150 MCG tablet TAKE 1 TABLET BY MOUTH  EVERY MORNING ON AN EMPTY  STOMACH 90 tablet 3  . ondansetron (ZOFRAN) 8 MG tablet Take 1 tablet (8 mg total) by mouth every 8 (eight) hours as needed for nausea or vomiting. 20 tablet 0  . pantoprazole (PROTONIX) 40 MG tablet TAKE 1 TABLET BY MOUTH  TWICE A DAY BEFORE MEALS 180 tablet 3  . ranitidine (ZANTAC) 150 MG tablet Take 150 mg by mouth 2 (two) times daily.    . sildenafil (VIAGRA) 25 MG tablet Take 1 tablet (25 mg total) by mouth daily as needed for erectile dysfunction. 10 tablet 0  . triamcinolone cream (KENALOG) 0.5 % Apply 1 application topically 4 (four) times daily. 60 g 2  . valACYclovir (VALTREX) 1000 MG tablet Take 1 tablet (1,000 mg total) by mouth daily. 90 tablet 3  . zolendronic acid (ZOMETA) 4 MG/5ML injection Inject 4 mg into the vein every 3 (three) months.     No current facility-administered medications for this visit.    Objective: Middle-aged white man in no acute distress  Vitals:   12/21/17 0848  BP: 117/65  Pulse: 65  Resp: 18  Temp: 97.9 F (36.6 C)  SpO2: 100%  Body mass index is 26.26 kg/m. Filed Weights   12/21/17 0848  Weight: 177 lb 12.8 oz (80.6 kg)   Sclerae unicteric, pupils round and equal No cervical or supraclavicular adenopathy Lungs no rales or rhonchi Heart regular rate and rhythm Abd soft, nontender, positive bowel sounds MSK no focal spinal tenderness, no upper extremity lymphedema Neuro: nonfocal, well oriented, appropriate affect  No visits with results within 1 Day(s) from this visit.  Latest known visit with results is:  Appointment on 12/17/2017  Component Date Value Ref Range Status  . Kappa free light chain 12/17/2017 36.9* 3.3 - 19.4 mg/L Final  . Lamda free light chains 12/17/2017 13.2  5.7 - 26.3 mg/L Final  . Kappa, lamda light chain ratio 12/17/2017  2.80* 0.26 - 1.65 Final   Comment: (NOTE) Performed At: [facility] [place], [place] [id number] Rush Farmer MD UK:[id number] Performed  at [facility], [place] [address], [place], [place] [address]   . Sodium 12/17/2017 139  136 - 145 mmol/L Final  . Potassium 12/17/2017 4.0  3.5 - 5.1 mmol/L Final  . Chloride 12/17/2017 108  98 - 109 mmol/L Final  . CO2 12/17/2017 24  22 - 29 mmol/L Final  . Glucose, Bld 12/17/2017 95  70 - 140 mg/dL Final  . BUN 12/17/2017 23  7 - 26 mg/dL Final  . Creatinine, Ser 12/17/2017 0.92  0.70 - 1.30 mg/dL Final  . Calcium 12/17/2017 9.9  8.4 - 10.4 mg/dL Final  . Total Protein 12/17/2017 7.3  6.4 - 8.3 g/dL Final  . Albumin 12/17/2017 3.6  3.5 - 5.0 g/dL Final  . AST 12/17/2017 25  5 - 34 U/L Final  . ALT 12/17/2017 26  0 - 55 U/L Final  . Alkaline Phosphatase 12/17/2017 86  40 - 150 U/L Final  . Total Bilirubin 12/17/2017 0.3  0.2 - 1.2 mg/dL Final  . GFR calc non Af Amer 12/17/2017 >60  >60 mL/min Final  . GFR calc Af Amer 12/17/2017 >60  >60 mL/min Final   Comment: (NOTE) The eGFR has been calculated using the CKD EPI equation. This calculation has not been validated in all clinical situations. eGFR's persistently <60 mL/min signify possible Chronic Kidney Disease.   Georgiann Hahn gap 12/17/2017 7  3 - 11 Final   Performed at [facility], [place] [address], [place], [place] [address]  . WBC 12/17/2017 4.9  4.0 - 10.3 K/uL Final  . RBC 12/17/2017 3.60* 4.20 - 5.82 MIL/uL Final  . Hemoglobin 12/17/2017 13.3  13.0 - 17.1 g/dL Final  . HCT 12/17/2017 38.7  38.4 - 49.9 % Final  . MCV 12/17/2017 107.6* 79.3 - 98.0 fL Final  . MCH 12/17/2017 36.9* 27.2 - 33.4 pg Final  . MCHC 12/17/2017 34.2  32.0 - 36.0 g/dL Final  . RDW 12/17/2017 13.7  11.0 - 14.6 % Final  . Platelets 12/17/2017 143  140 - 400 K/uL Final  . Neutrophils Relative % 12/17/2017 56  % Final  . Neutro Abs 12/17/2017 2.7   1.5 - 6.5 K/uL Final  . Lymphocytes Relative 12/17/2017 29  % Final  . Lymphs Abs 12/17/2017 1.4  0.9 - 3.3 K/uL Final  . Monocytes Relative 12/17/2017 12  % Final  . Monocytes Absolute 12/17/2017 0.6  0.1 - 0.9 K/uL Final  . Eosinophils Relative 12/17/2017 3  % Final  . Eosinophils Absolute 12/17/2017 0.1  0.0 - 0.5 K/uL Final  . Basophils Relative 12/17/2017 0  % Final  . Basophils Absolute 12/17/2017 0.0  0.0 - 0.1 K/uL Final   Performed at [facility], [place] [address] [place], [place] [address]  . IgG (Immunoglobin G), Serum 12/17/2017 1,390  700 - 1,600 mg/dL Final  . IgA 12/17/2017 130  61 - 437 mg/dL Final  . IgM (Immunoglobulin M), Srm 12/17/2017 46  20 - 172 mg/dL Final  . Total Protein ELP 12/17/2017 6.6  6.0 - 8.5 g/dL Corrected  . Albumin SerPl Elph-Mcnc 12/17/2017 3.6  2.9 - 4.4 g/dL Corrected  . Alpha 1 12/17/2017 0.2  0.0 - 0.4 g/dL Corrected  . Alpha2 Glob SerPl Elph-Mcnc 12/17/2017 0.7  0.4 - 1.0 g/dL Corrected  . Caulin-Globulin SerPl Elph-Mcnc 12/17/2017 1.0  0.7 - 1.3 g/dL Corrected  . Gamma Glob SerPl Elph-Mcnc 12/17/2017 1.2  0.4 - 1.8 g/dL Corrected  . M Protein  SerPl Elph-Mcnc 12/17/2017 0.4* Not Observed g/dL Corrected   Comment: An additional m spike was observed at a concentration of 0.4 g/dl   . Globulin, Total 12/17/2017 3.0  2.2 - 3.9 g/dL Corrected  . Albumin/Glob SerPl 12/17/2017 1.3  0.7 - 1.7 Corrected  . IFE 1 12/17/2017 Comment   Corrected   Comment: (NOTE) Immunofixation shows IgG monoclonal protein with kappa light chain specificity. Please note that samples from patients receiving DARZALEX(R) (daratumumab) treatment can appear as an "IgG kappa" and mask a complete response. If this patient is receiving DARA, this IFE assay interference can be removed by ordering test number [id number]-"Immunofixation, Daratumumab-Specific, Serum" and submitting a new sample for testing or by calling the lab to add this test to the current  sample. Immunofixation shows IgG monoclonal protein with lambda light chain specificity.   . Please Note 12/17/2017 Comment   Corrected   Comment: (NOTE) Protein electrophoresis scan will follow via computer, mail, or courier delivery. Performed At: [facility] [place], [place] [id number] Rush Farmer MD [place]:[id number] Performed at [facility], [place] [address] [place], [place] [address]      STUDIES:  Dg Bone Density  Result Date: 11/30/2017 EXAM: DUAL X-RAY ABSORPTIOMETRY (DXA) FOR BONE MINERAL DENSITY IMPRESSION: Referring Physician:  Chauncey Cruel PATIENT: Name: Andre [name], Andre [name] Patient ID: [id number] Birth Date: 1950-11-11 Height: 69.0 in. Sex: Male Measured: 11/30/2017 Weight: 180.9 lbs. Indications: Caucasian, Estrogen Deficient, hypogonadism, Hypothyroid, Levothyroxine, Lexapro, Multiple Myloma, Pantoprazole, Postmenopausal Fractures: None Treatments: Vitamin D (E933.5), zometa ASSESSMENT: The BMD measured at Femur Neck Right is 0.950 g/cm2 with a T-score of -0.6. This patient is considered normal according to the [facility]) criteria. Site Region Measured Date Measured Age YA BMD Significant CHANGE T-score AP Spine  L1-L4      9/30[age]    68.2         -0.3    1.118 g/cm2 DualFemur Neck Right 9/30/2017    68.4         -0.4    0.950 g/cm2 World Health Organization [facility]) criteria for post-menopausal, Caucasian Women: Normal       T-score at or above -1 SD Osteopenia   T-score between -1 and -2.5 SD Osteoporosis T-score at or below -2.5 SD RECOMMENDATION: [place] recommends that FDA-approved medical therapies be considered in postmenopausal women and men age 44 or older with a: 1. Hip or vertebral (clinical or morphometric) fracture. 2. T-score of less than or equal to -2.5 at the spine or hip. 3. Ten-year fracture probability by FRAX of 3% or greater for hip fracture or 20% or greater for  major osteoporotic fracture. All treatment decisions require clinical judgment and consideration of individual patient factors, including patient preferences, co-morbidities, previous drug use, risk factors not captured in the FRAX model (e.g. falls, vitamin D deficiency, increased bone turnover, interval significant decline in bone density) and possible under- or over-estimation of fracture risk by FRAX. All patients should ensure an adequate intake of dietary calcium (1200 mg/d) and vitamin D (800 IU daily) unless contraindicated. FOLLOW-UP: People with diagnosed cases of osteoporosis or osteopenia should be regularly tested for bone mineral density. For patients eligible for Medicare, routine testing is allowed once every 2 years. The testing frequency can be increased to one year for patients who have rapidly progressing disease, or for those who are receiving medical therapy to restore bone mass. I have reviewed this report and agree with the above findings.  [facility] Radiology Electronically Signed   By: [name] Clines M.D.   On: 11/30/2017 11:04   Dg Bone Survey Met  Result Date: 12/07/2017 CLINICAL DATA:  History of multiple myeloma. EXAM: METASTATIC BONE SURVEY COMPARISON:  09/06/2015. FINDINGS: Imaging of the axial and appendicular skeleton performed. Innumerable lesions are again noted throughout the skull. These may have increased in number from prior exam. Lesions in the humeri are again noted. Similar findings noted on prior exam. Stable T12 compression fracture. Small density noted right lung base, possibly atelectasis. Small lytic lesion noted the femurs appear to be stable. Questionable small lesions in the left radius unchanged. IMPRESSION: 1. Innumerable lesions again noted throughout the skull. These may have increased in number from prior exam. 2. Remaining lesions within the humeri, femurs, and left radius are unchanged. No other new abnormalities identified. Electronically Signed    By: Marcello Moores  Register   On: 12/07/2017 16:21     ASSESSMENT: 67 y.o. [place] man with a history of multiple myeloma diagnosed February of 2012, with an initial M-spike of 2.6 g/dL, IFE showing an IgG lambda paraprotein and free lambda light chains in the urine. Cytogenetics showed trisomy 52. Bone marrow biopsy showed a 22% plasmacytosis. Treated with   (1) bortezomib (subcutaneously), lenalidomide, and dexamethasone, with repeat bone marrow biopsy May of 2012 showing 10% plasmacytosis   (2) high-dose chemotherapy with BCNU and melphalan at [facility], followed by stem cell rescue July of 2012   (3) on zoledronic acid started December of 2012, initially monthly, currently given every 3 months, most recent dose  12/07/2015  (4) low-dose lenalidomide resumed April 2013, interrupted several times.  Resumed again on 02/19/2013, ata dose of 5 mg daily, 21 days on and 7 days off, later further reduced to 7 days on, 7 days off  (5) CNS symptoms and abnormal brain MRI extensively evaluated by neurology with no definitive diagnosis established  (6) rising M spike noted June 2014 but did not meet criteria for carfilzomib study  (7) on lenalidomide 25 mg daily, 14 days on, 7 days off, starting 04/18/2013, interrupted December 2014 because of rash;   (a) resumed January 2015 at 10 mg/ day at 21 days on/ 7 days off  (Matthias) starting 08/18/2014 decreased to 10 mg/ day 14 days on, 7 days off because of cytopenias  (c) as of February 2016 was on 5 mg daily 7 days on 7 days off  (d) lenalidomide discontinued December 2016 with evidence of disease progression  (8) transient global amnesia 05/29/2015, resolved without intervention  (9) starting PVD 10/25/2015 w ASA 325 thromboprophylaxis, valacyclovir prophylaxis, last dose 12/17/2015  (a) pomalidomide 4 mg/d days 1-14  ([place]) bortezomib sQ days 2,5,9,12 of each 21 day cycle  (c) dexamethasome 20 mg two days a week  (d) dexamethasone bortezomib and pomalidomide  discontinued late December 2018 with poor tolerance  (10) metapneumovirus pneumonia April 2017  (a) completing course of steroids and week of bactrim mid April 2017  (11) status post second autologous transplant at [facility] 02/04/2016(preparatory regimen melphalan 200 mg/m)  (a) received twelve-month vaccinations 03/14/2017 (DPT, Haemophilus, Pneumovax 13, polio)  (Fil[place]) 14 months injections 05/04/2016 include DTaP, Hib conjugate, HepB energex [place] 20 mcg/ml, Prevnar 13  (c) 24 month vaccines due at [facility] June 2019  (12) maintenance therapy started November 2017, consisting of  (a) bortezomib 1.3 mg/M2 QOW, first dose 07/27/2016  (Dhilan) pomalidomide 1 mg days 1-21 Q28 days, started 07/19/2016  (c) zolendronate monthly started 07/27/2016 (previously Q 3 months)  however patient unable to tolerate, and changed back to q3 month in April, 2018   (d) Bortezomib changed to monthly as of June 2018 because of tolerance issues, however discontinued after 09/14/2017 dose because of a rise in his M spike.  (e) pomalidomide held after 10/18/2017 in preparation for possible study at [place] (venetoclax)  (f) with numbers actually improved off treatment, resumed every 2-week bortezomib 12/25/2017  PLAN:  Andre [name] has been off treatment now 2 months.  He has been off Velcade even longer than that.  Despite that, his M spike is actually lower and his/lambda ratio is trending down.  This is remarkable and welcome.  Accordingly we are not going to treat him with daratumumab at this point.  I think it would be more reasonable to put him back on bortezomib every 2 weeks and continue to follow his labs.  If there is a rise in his numbers we would add pomalidomide again at a higher dose than before.  If there is further rise despite that then I think he needs to get back to [facility] and consider the venetoclax study  He is very much in favor of this plan.  He is going to continue his excellent diet and exercise program.  We are going to  keep checking his labs on an every 4-week basis and he will see me again mid June  Incidentally he will be receiving zolendronate again in July and I will remind him to take calcium the day of treatment, which I think will take care of the symptoms he experienced last time.  He knows to call for any other issues that may develop before the next visit.  Tuwana Kapaun, [name], MD  12/21/17 9:18 AM Medical Oncology and Hematology [facility] [address] [place], [place] [address] Tel. [phone]    Fax. [phone]  This document serves as a record of services personally performed by Chauncey Cruel, MD. It was created on his behalf by Margit Banda, a trained medical scribe. The creation of this record is based on the scribe's personal observations and the provider's statements to them.   I have reviewed the above documentation for accuracy and completeness, and I agree with the above.

## 2017-12-21 ENCOUNTER — Telehealth: Payer: Self-pay | Admitting: Oncology

## 2017-12-21 ENCOUNTER — Inpatient Hospital Stay (HOSPITAL_BASED_OUTPATIENT_CLINIC_OR_DEPARTMENT_OTHER): Payer: Commercial Managed Care - PPO | Admitting: Oncology

## 2017-12-21 VITALS — BP 117/65 | HR 65 | Temp 97.9°F | Resp 18 | Ht 69.0 in | Wt 177.8 lb

## 2017-12-21 DIAGNOSIS — Z9484 Stem cells transplant status: Secondary | ICD-10-CM | POA: Diagnosis not present

## 2017-12-21 DIAGNOSIS — Z79899 Other long term (current) drug therapy: Secondary | ICD-10-CM | POA: Diagnosis not present

## 2017-12-21 DIAGNOSIS — Z5112 Encounter for antineoplastic immunotherapy: Secondary | ICD-10-CM | POA: Diagnosis not present

## 2017-12-21 DIAGNOSIS — C9000 Multiple myeloma not having achieved remission: Secondary | ICD-10-CM | POA: Diagnosis not present

## 2017-12-21 NOTE — Telephone Encounter (Signed)
Gave patient AVS and calendar of upcoming April through July appointments.

## 2017-12-21 NOTE — Progress Notes (Signed)
DISCONTINUE ON PATHWAY REGIMEN - Multiple Myeloma and Other Plasma Cell Dyscrasias     A cycle is every 28 days:     Daratumumab   **Always confirm dose/schedule in your pharmacy ordering system**    REASON: Other Reason PRIOR TREATMENT: [id number]: Daratumumab 16 mg/kg q28 Days Until Progression or Unacceptable Toxicity TREATMENT RESPONSE: Unable to Evaluate  START OFF PATHWAY REGIMEN - Multiple Myeloma and Other Plasma Cell Dyscrasias   [id number]:Bortezomib:   A cycle is every 21 days:     Bortezomib   **Always confirm dose/schedule in your pharmacy ordering system**    Patient Characteristics: Relapsed / Refractory, All Lines of Therapy R-ISS Staging: III Disease Classification: Relapsed Line of Therapy: Second Line Intent of Therapy: Non-Curative / Palliative Intent, Discussed with Patient

## 2017-12-25 ENCOUNTER — Inpatient Hospital Stay: Payer: Commercial Managed Care - PPO

## 2017-12-25 ENCOUNTER — Other Ambulatory Visit: Payer: Commercial Managed Care - PPO

## 2017-12-25 ENCOUNTER — Ambulatory Visit: Payer: Commercial Managed Care - PPO

## 2017-12-25 ENCOUNTER — Telehealth: Payer: Self-pay

## 2017-12-25 VITALS — BP 98/60 | HR 82 | Temp 97.7°F | Resp 17

## 2017-12-25 DIAGNOSIS — Z5112 Encounter for antineoplastic immunotherapy: Secondary | ICD-10-CM | POA: Diagnosis not present

## 2017-12-25 DIAGNOSIS — C9000 Multiple myeloma not having achieved remission: Secondary | ICD-10-CM

## 2017-12-25 DIAGNOSIS — Z9481 Bone marrow transplant status: Secondary | ICD-10-CM

## 2017-12-25 MED ORDER — PROCHLORPERAZINE MALEATE 10 MG PO TABS
10.0000 mg | ORAL_TABLET | Freq: Once | ORAL | Status: AC
  Administered 2017-12-25: 10 mg via ORAL

## 2017-12-25 MED ORDER — BORTEZOMIB CHEMO SQ INJECTION 3.5 MG (2.5MG/ML)
1.3000 mg/m2 | Freq: Once | INTRAMUSCULAR | Status: AC
  Administered 2017-12-25: 2.5 mg via SUBCUTANEOUS
  Filled 2017-12-25: qty 2.5

## 2017-12-25 MED ORDER — PROCHLORPERAZINE MALEATE 10 MG PO TABS
ORAL_TABLET | ORAL | Status: AC
  Filled 2017-12-25: qty 1

## 2017-12-25 NOTE — Telephone Encounter (Signed)
Per Dr [name], ok to refer to last week's labs for Velcade- notified Katelin RN in infusion.

## 2017-12-25 NOTE — Patient Instructions (Signed)
[facility] Discharge Instructions for Patients Receiving Chemotherapy  Today you received the following chemotherapy agents: Bortezomib (Velcade)  To help prevent nausea and vomiting after your treatment, we encourage you to take your nausea medication  as prescribed.    If you develop nausea and vomiting that is not controlled by your nausea medication, call the clinic.   BELOW ARE SYMPTOMS THAT SHOULD BE REPORTED IMMEDIATELY:  *FEVER GREATER THAN 100.5 F  *CHILLS WITH OR WITHOUT FEVER  NAUSEA AND VOMITING THAT IS NOT CONTROLLED WITH YOUR NAUSEA MEDICATION  *UNUSUAL SHORTNESS OF BREATH  *UNUSUAL BRUISING OR BLEEDING  TENDERNESS IN MOUTH AND THROAT WITH OR WITHOUT PRESENCE OF ULCERS  *URINARY PROBLEMS  *BOWEL PROBLEMS  UNUSUAL RASH Items with * indicate a potential emergency and should be followed up as soon as possible.  Feel free to call the clinic should you have any questions or concerns. The clinic phone number is [phone].  Please show the CHEMO ALERT CARD at check-in to the Emergency Department and triage nurse.   

## 2018-01-08 ENCOUNTER — Inpatient Hospital Stay: Payer: Commercial Managed Care - PPO

## 2018-01-08 ENCOUNTER — Other Ambulatory Visit: Payer: Self-pay

## 2018-01-08 VITALS — BP 141/73 | HR 64 | Temp 97.7°F | Resp 17 | Ht 69.0 in | Wt 177.2 lb

## 2018-01-08 DIAGNOSIS — T451X5A Adverse effect of antineoplastic and immunosuppressive drugs, initial encounter: Secondary | ICD-10-CM

## 2018-01-08 DIAGNOSIS — C9000 Multiple myeloma not having achieved remission: Secondary | ICD-10-CM

## 2018-01-08 DIAGNOSIS — D701 Agranulocytosis secondary to cancer chemotherapy: Secondary | ICD-10-CM

## 2018-01-08 DIAGNOSIS — Z5112 Encounter for antineoplastic immunotherapy: Secondary | ICD-10-CM | POA: Diagnosis not present

## 2018-01-08 DIAGNOSIS — Z9481 Bone marrow transplant status: Secondary | ICD-10-CM

## 2018-01-08 DIAGNOSIS — R9089 Other abnormal findings on diagnostic imaging of central nervous system: Secondary | ICD-10-CM

## 2018-01-08 LAB — COMPREHENSIVE METABOLIC PANEL
ALBUMIN: 3.9 g/dL (ref 3.5–5.0)
ALK PHOS: 88 U/L (ref 40–150)
ALT: 26 U/L (ref 0–55)
AST: 26 U/L (ref 5–34)
Anion gap: 9 (ref 3–11)
BUN: 29 mg/dL — AB (ref 7–26)
CO2: 21 mmol/L — AB (ref 22–29)
Calcium: 10 mg/dL (ref 8.4–10.4)
Chloride: 107 mmol/L (ref 98–109)
Creatinine, Ser: 0.96 mg/dL (ref 0.70–1.30)
GFR calc Af Amer: >60 mL/min (ref >60)
GFR calc non Af Amer: >60 mL/min (ref >60)
GLUCOSE: 70 mg/dL (ref 70–140)
POTASSIUM: 4.2 mmol/L (ref 3.5–5.1)
SODIUM: 137 mmol/L (ref 136–145)
Total Bilirubin: 0.4 mg/dL (ref 0.2–1.2)
Total Protein: 7.7 g/dL (ref 6.4–8.3)

## 2018-01-08 LAB — CBC WITH DIFFERENTIAL/PLATELET
Basophils Absolute: 0 10*3/uL (ref 0.0–0.1)
Basophils Relative: 0 %
EOS PCT: 3 %
Eosinophils Absolute: 0.2 10*3/uL (ref 0.0–0.5)
HEMATOCRIT: 39 % (ref 38.4–49.9)
Hemoglobin: 13.4 g/dL (ref 13.0–17.1)
LYMPHS PCT: 28 %
Lymphs Abs: 1.4 10*3/uL (ref 0.9–3.3)
MCH: 36 pg — AB (ref 27.2–33.4)
MCHC: 34.4 g/dL (ref 32.0–36.0)
MCV: 104.8 fL — AB (ref 79.3–98.0)
MONO ABS: 0.7 10*3/uL (ref 0.1–0.9)
MONOS PCT: 14 %
NEUTROS ABS: 2.6 10*3/uL (ref 1.5–6.5)
Neutrophils Relative %: 55 %
PLATELETS: 126 10*3/uL — AB (ref 140–400)
RBC: 3.72 MIL/uL — ABNORMAL LOW (ref 4.20–5.82)
RDW: 13.3 % (ref 11.0–14.6)
WBC: 4.9 10*3/uL (ref 4.0–10.3)

## 2018-01-08 MED ORDER — BORTEZOMIB CHEMO SQ INJECTION 3.5 MG (2.5MG/ML)
1.3000 mg/m2 | Freq: Once | INTRAMUSCULAR | Status: AC
  Administered 2018-01-08: 2.5 mg via SUBCUTANEOUS
  Filled 2018-01-08: qty 2.5

## 2018-01-08 MED ORDER — PROCHLORPERAZINE MALEATE 10 MG PO TABS: 10.0000 mg | ORAL_TABLET | Freq: Once | ORAL | Status: DC

## 2018-01-08 MED ORDER — ONDANSETRON HCL 8 MG PO TABS: 8.0000 mg | ORAL_TABLET | Freq: Three times a day (TID) | ORAL | 0 refills | Status: DC | PRN

## 2018-01-08 NOTE — Patient Instructions (Signed)
[facility] Discharge Instructions for Patients Receiving Chemotherapy  Today you received the following chemotherapy agents Velcade To help prevent nausea and vomiting after your treatment, we encourage you to take your nausea medication as prescribed.   If you develop nausea and vomiting that is not controlled by your nausea medication, call the clinic.   BELOW ARE SYMPTOMS THAT SHOULD BE REPORTED IMMEDIATELY:  *FEVER GREATER THAN 100.5 F  *CHILLS WITH OR WITHOUT FEVER  NAUSEA AND VOMITING THAT IS NOT CONTROLLED WITH YOUR NAUSEA MEDICATION  *UNUSUAL SHORTNESS OF BREATH  *UNUSUAL BRUISING OR BLEEDING  TENDERNESS IN MOUTH AND THROAT WITH OR WITHOUT PRESENCE OF ULCERS  *URINARY PROBLEMS  *BOWEL PROBLEMS  UNUSUAL RASH Items with * indicate a potential emergency and should be followed up as soon as possible.  Feel free to call the clinic should you have any questions or concerns. The clinic phone number is [phone].  Please show the CHEMO ALERT CARD at check-in to the Emergency Department and triage nurse.   

## 2018-01-09 LAB — KAPPA/LAMBDA LIGHT CHAINS
KAPPA, LAMDA LIGHT CHAIN RATIO: 2.23 — AB (ref 0.26–1.65)
Kappa free light chain: 32.5 mg/L — ABNORMAL HIGH (ref 3.3–19.4)
LAMDA FREE LIGHT CHAINS: 14.6 mg/L (ref 5.7–26.3)

## 2018-01-11 LAB — MULTIPLE MYELOMA PANEL, SERUM
ALBUMIN/GLOB SERPL: 1.2 (ref 0.7–1.7)
ALPHA 1: 0.2 g/dL (ref 0.0–0.4)
ALPHA2 GLOB SERPL ELPH-MCNC: 0.7 g/dL (ref 0.4–1.0)
Albumin SerPl Elph-Mcnc: 3.7 g/dL (ref 2.9–4.4)
B-GLOBULIN SERPL ELPH-MCNC: 1.1 g/dL (ref 0.7–1.3)
GLOBULIN, TOTAL: 3.3 g/dL (ref 2.2–3.9)
Gamma Glob SerPl Elph-Mcnc: 1.2 g/dL (ref 0.4–1.8)
IgA: 126 mg/dL (ref 61–437)
IgG (Immunoglobin G), Serum: 1380 mg/dL (ref 700–1600)
IgM (Immunoglobulin M), Srm: 41 mg/dL (ref 20–172)
M PROTEIN SERPL ELPH-MCNC: 0.4 g/dL — AB
Total Protein ELP: 7 g/dL (ref 6.0–8.5)

## 2018-01-14 ENCOUNTER — Other Ambulatory Visit: Payer: Self-pay | Admitting: Oncology

## 2018-01-14 ENCOUNTER — Encounter: Payer: Self-pay | Admitting: Oncology

## 2018-01-16 DIAGNOSIS — Z719 Counseling, unspecified: Secondary | ICD-10-CM | POA: Diagnosis not present

## 2018-01-22 ENCOUNTER — Inpatient Hospital Stay: Payer: 59 | Attending: Oncology

## 2018-01-22 ENCOUNTER — Ambulatory Visit: Payer: Commercial Managed Care - PPO

## 2018-01-22 ENCOUNTER — Other Ambulatory Visit: Payer: Commercial Managed Care - PPO

## 2018-01-22 ENCOUNTER — Inpatient Hospital Stay: Payer: 59

## 2018-01-22 VITALS — BP 118/70 | HR 74 | Temp 98.0°F | Resp 17 | Ht 69.0 in | Wt 177.0 lb

## 2018-01-22 DIAGNOSIS — Z5112 Encounter for antineoplastic immunotherapy: Secondary | ICD-10-CM | POA: Insufficient documentation

## 2018-01-22 DIAGNOSIS — C9000 Multiple myeloma not having achieved remission: Secondary | ICD-10-CM | POA: Diagnosis not present

## 2018-01-22 DIAGNOSIS — Z9481 Bone marrow transplant status: Secondary | ICD-10-CM

## 2018-01-22 LAB — CBC WITH DIFFERENTIAL/PLATELET
BASOS PCT: 0 %
Basophils Absolute: 0 10*3/uL (ref 0.0–0.1)
EOS ABS: 0.2 10*3/uL (ref 0.0–0.5)
EOS PCT: 3 %
HCT: 40.6 % (ref 38.4–49.9)
Hemoglobin: 13.8 g/dL (ref 13.0–17.1)
Lymphocytes Relative: 25 %
Lymphs Abs: 1.5 10*3/uL (ref 0.9–3.3)
MCH: 36.2 pg — ABNORMAL HIGH (ref 27.2–33.4)
MCHC: 33.9 g/dL (ref 32.0–36.0)
MCV: 106.6 fL — ABNORMAL HIGH (ref 79.3–98.0)
MONO ABS: 0.8 10*3/uL (ref 0.1–0.9)
Monocytes Relative: 13 %
Neutro Abs: 3.4 10*3/uL (ref 1.5–6.5)
Neutrophils Relative %: 59 %
PLATELETS: 145 10*3/uL (ref 140–400)
RBC: 3.8 MIL/uL — ABNORMAL LOW (ref 4.20–5.82)
RDW: 13.1 % (ref 11.0–14.6)
WBC: 5.8 10*3/uL (ref 4.0–10.3)

## 2018-01-22 LAB — COMPREHENSIVE METABOLIC PANEL
ALBUMIN: 3.9 g/dL (ref 3.5–5.0)
ALT: 30 U/L (ref 0–55)
ANION GAP: 6 (ref 3–11)
AST: 29 U/L (ref 5–34)
Alkaline Phosphatase: 90 U/L (ref 40–150)
BILIRUBIN TOTAL: 0.3 mg/dL (ref 0.2–1.2)
BUN: 31 mg/dL — ABNORMAL HIGH (ref 7–26)
CHLORIDE: 108 mmol/L (ref 98–109)
CO2: 24 mmol/L (ref 22–29)
Calcium: 9.9 mg/dL (ref 8.4–10.4)
Creatinine, Ser: 1.02 mg/dL (ref 0.70–1.30)
GFR calc Af Amer: >60 mL/min (ref >60)
GFR calc non Af Amer: >60 mL/min (ref >60)
GLUCOSE: 93 mg/dL (ref 70–140)
POTASSIUM: 4.2 mmol/L (ref 3.5–5.1)
SODIUM: 138 mmol/L (ref 136–145)
TOTAL PROTEIN: 7.5 g/dL (ref 6.4–8.3)

## 2018-01-22 MED ORDER — BORTEZOMIB CHEMO SQ INJECTION 3.5 MG (2.5MG/ML)
1.3000 mg/m2 | Freq: Once | INTRAMUSCULAR | Status: AC
  Administered 2018-01-22: 2.5 mg via SUBCUTANEOUS
  Filled 2018-01-22: qty 2.5

## 2018-01-22 MED ORDER — PROCHLORPERAZINE MALEATE 10 MG PO TABS: 10.0000 mg | ORAL_TABLET | Freq: Once | ORAL | Status: DC

## 2018-01-22 NOTE — Patient Instructions (Signed)
[facility] Discharge Instructions for Patients Receiving Chemotherapy  Today you received the following chemotherapy agents Velcade To help prevent nausea and vomiting after your treatment, we encourage you to take your nausea medication as prescribed.   If you develop nausea and vomiting that is not controlled by your nausea medication, call the clinic.   BELOW ARE SYMPTOMS THAT SHOULD BE REPORTED IMMEDIATELY:  *FEVER GREATER THAN 100.5 F  *CHILLS WITH OR WITHOUT FEVER  NAUSEA AND VOMITING THAT IS NOT CONTROLLED WITH YOUR NAUSEA MEDICATION  *UNUSUAL SHORTNESS OF BREATH  *UNUSUAL BRUISING OR BLEEDING  TENDERNESS IN MOUTH AND THROAT WITH OR WITHOUT PRESENCE OF ULCERS  *URINARY PROBLEMS  *BOWEL PROBLEMS  UNUSUAL RASH Items with * indicate a potential emergency and should be followed up as soon as possible.  Feel free to call the clinic should you have any questions or concerns. The clinic phone number is [phone].  Please show the CHEMO ALERT CARD at check-in to the Emergency Department and triage nurse.   

## 2018-01-25 ENCOUNTER — Ambulatory Visit: Payer: Commercial Managed Care - PPO | Admitting: Internal Medicine

## 2018-01-28 ENCOUNTER — Other Ambulatory Visit: Payer: Commercial Managed Care - PPO

## 2018-01-28 ENCOUNTER — Ambulatory Visit: Payer: 59 | Admitting: Internal Medicine

## 2018-01-28 ENCOUNTER — Encounter: Payer: Self-pay | Admitting: Internal Medicine

## 2018-01-28 DIAGNOSIS — J4521 Mild intermittent asthma with (acute) exacerbation: Secondary | ICD-10-CM | POA: Diagnosis not present

## 2018-01-28 DIAGNOSIS — K5900 Constipation, unspecified: Secondary | ICD-10-CM

## 2018-01-28 DIAGNOSIS — K219 Gastro-esophageal reflux disease without esophagitis: Secondary | ICD-10-CM | POA: Diagnosis not present

## 2018-01-28 DIAGNOSIS — C9000 Multiple myeloma not having achieved remission: Secondary | ICD-10-CM

## 2018-01-28 DIAGNOSIS — F5101 Primary insomnia: Secondary | ICD-10-CM | POA: Diagnosis not present

## 2018-01-28 NOTE — Assessment & Plan Note (Signed)
on bortezomib inj q 2 weeks zometa

## 2018-01-28 NOTE — Assessment & Plan Note (Addendum)
Due to chemo - on bortezomib inj q 2 weeks LOC

## 2018-01-28 NOTE — Assessment & Plan Note (Signed)
Doing well 

## 2018-01-28 NOTE — Assessment & Plan Note (Signed)
Protonix.  ?

## 2018-01-28 NOTE — Assessment & Plan Note (Signed)
TSH, FT4 

## 2018-01-28 NOTE — Progress Notes (Signed)
Subjective:  Patient ID: Andre [name], male    DOB: November 30, 1950  Age: 67 y.o. MRN: [id number]  CC: No chief complaint on file.   HPI Andre [name] presents for constipation due to chemo shots q 2 weeks C/o nausea F/o GERD   Outpatient Medications Prior to Visit  Medication Sig Dispense Refill  . aspirin EC 81 MG tablet Take 1 tablet (81 mg total) by mouth daily. 100 tablet 3  . [place] complex vitamins tablet Take 1 tablet by mouth daily. 100 tablet 3  . cetirizine (ZYRTEC) 10 MG tablet Take 10 mg by mouth daily.    . Cholecalciferol (VITAMIN D3) 2000 units capsule Take 1 capsule (2,000 Units total) by mouth daily. 100 capsule 3  . Cholecalciferol (VITAMIN D3) 50000 units CAPS Take 1 capsule by mouth once a week. 6 capsule 0  . docusate sodium (COLACE) 100 MG capsule Take 100 mg by mouth daily as needed for mild constipation or moderate constipation.    [name] escitalopram (LEXAPRO) 10 MG tablet TAKE 1 TABLET BY MOUTH  DAILY 90 tablet 3  . escitalopram (LEXAPRO) 10 MG tablet take 1 tablet by mouth once daily 30 tablet 11  . feeding supplement, ENSURE ENLIVE, (ENSURE ENLIVE) LIQD Take 237 mLs by mouth 2 (two) times daily between meals. 237 mL 12  . hydrOXYzine (ATARAX/VISTARIL) 25 MG tablet Take 1 tablet (25 mg total) by mouth 3 (three) times daily as needed. 30 tablet 0  . levothyroxine (SYNTHROID, LEVOTHROID) 150 MCG tablet TAKE 1 TABLET BY MOUTH  EVERY MORNING ON AN EMPTY  STOMACH 90 tablet 3  . ondansetron (ZOFRAN) 8 MG tablet Take 1 tablet (8 mg total) by mouth every 8 (eight) hours as needed for nausea or vomiting. 20 tablet 0  . pantoprazole (PROTONIX) 40 MG tablet TAKE 1 TABLET BY MOUTH  TWICE A DAY BEFORE MEALS 180 tablet 3  . pomalidomide (POMALYST) 1 MG capsule TAKE 1 CAPSULE BY MOUTH EVERY DAY FOR 21 DAYS, THEN 7 DAYS OFF    . ranitidine (ZANTAC) 150 MG tablet Take 150 mg by mouth 2 (two) times daily.    . sildenafil (VIAGRA) 25 MG tablet Take 1 tablet (25 mg total) by mouth  daily as needed for erectile dysfunction. 10 tablet 0  . triamcinolone cream (KENALOG) 0.5 % Apply 1 application topically 4 (four) times daily. 60 g 2  . valACYclovir (VALTREX) 1000 MG tablet Take 1 tablet (1,000 mg total) by mouth daily. 90 tablet 3  . zolendronic acid (ZOMETA) 4 MG/5ML injection Inject 4 mg into the vein every 3 (three) months.     No facility-administered medications prior to visit.     ROS Review of Systems  Constitutional: Positive for fatigue. Negative for appetite change and unexpected weight change.  HENT: Negative for congestion, nosebleeds, sneezing, sore throat and trouble swallowing.   Eyes: Negative for itching and visual disturbance.  Respiratory: Negative for cough.   Cardiovascular: Negative for chest pain, palpitations and leg swelling.  Gastrointestinal: Positive for constipation and nausea. Negative for abdominal distention, blood in stool and diarrhea.  Genitourinary: Negative for frequency and hematuria.  Musculoskeletal: Positive for arthralgias. Negative for back pain, gait problem, joint swelling and neck pain.  Skin: Negative for rash.  Neurological: Negative for dizziness, tremors, speech difficulty and weakness.  Psychiatric/Behavioral: Negative for agitation, dysphoric mood and sleep disturbance. The patient is not nervous/anxious.     Objective:  BP 116/78 (BP Location: Left Arm, Patient Position: Sitting, Cuff Size:  Normal)   Pulse 95   Temp 98.1 F (36.7 C) (Oral)   Ht 5' 9" (1.753 m)   Wt 176 lb 1.3 oz (79.9 kg)   SpO2 97%   BMI 26.00 kg/m   BP Readings from Last 3 Encounters:  01/28/18 116/78  01/22/18 118/70  01/08/18 (!) 141/73    Wt Readings from Last 3 Encounters:  01/28/18 176 lb 1.3 oz (79.9 kg)  01/22/18 177 lb (80.3 kg)  01/08/18 177 lb 4 oz (80.4 kg)    Physical Exam  Constitutional: He is oriented to person, place, and time. He appears well-developed. No distress.  NAD  HENT:  Mouth/Throat: Oropharynx is  clear and moist.  Eyes: Pupils are equal, round, and reactive to light. Conjunctivae are normal.  Neck: Normal range of motion. No JVD present. No thyromegaly present.  Cardiovascular: Normal rate, regular rhythm, normal heart sounds and intact distal pulses. Exam reveals no gallop and no friction rub.  No murmur heard. Pulmonary/Chest: Effort normal and breath sounds normal. No respiratory distress. He has no wheezes. He has no rales. He exhibits no tenderness.  Abdominal: Soft. Bowel sounds are normal. He exhibits no distension and no mass. There is no tenderness. There is no rebound and no guarding.  Musculoskeletal: Normal range of motion. He exhibits no edema or tenderness.  Lymphadenopathy:    He has no cervical adenopathy.  Neurological: He is alert and oriented to person, place, and time. He has normal reflexes. No cranial nerve deficit. He exhibits normal muscle tone. He displays a negative Romberg sign. Coordination and gait normal.  Skin: Skin is warm and dry. No rash noted.  Psychiatric: He has a normal mood and affect. His behavior is normal. Judgment and thought content normal.   Appears tired a little  Lab Results  Component Value Date   WBC 5.8 01/22/2018   HGB 13.8 01/22/2018   HCT 40.6 01/22/2018   PLT 145 01/22/2018   GLUCOSE 93 01/22/2018   CHOL 164 09/06/2017   TRIG 194.0 (H) 09/06/2017   HDL 38.60 (L) 09/06/2017   LDLDIRECT 155.8 11/13/2012   LDLCALC 86 09/06/2017   ALT 30 01/22/2018   AST 29 01/22/2018   NA 138 01/22/2018   K 4.2 01/22/2018   CL 108 01/22/2018   CREATININE 1.02 01/22/2018   BUN 31 (H) 01/22/2018   CO2 24 01/22/2018   TSH 0.05 (L) 09/06/2017   PSA 1.76 09/06/2017   INR 1.07 02/27/2016   HGBA1C 5.4 03/27/2012    Dg Bone Survey Met  Result Date: 12/07/2017 CLINICAL DATA:  History of multiple myeloma. EXAM: METASTATIC BONE SURVEY COMPARISON:  09/06/2015. FINDINGS: Imaging of the axial and appendicular skeleton performed. Innumerable  lesions are again noted throughout the skull. These may have increased in number from prior exam. Lesions in the humeri are again noted. Similar findings noted on prior exam. Stable T12 compression fracture. Small density noted right lung base, possibly atelectasis. Small lytic lesion noted the femurs appear to be stable. Questionable small lesions in the left radius unchanged. IMPRESSION: 1. Innumerable lesions again noted throughout the skull. These may have increased in number from prior exam. 2. Remaining lesions within the humeri, femurs, and left radius are unchanged. No other new abnormalities identified. Electronically Signed   By: Marcello Moores  Register   On: 12/07/2017 16:21    Assessment & Plan:   There are no diagnoses linked to this encounter. I am having Windy Canny "[name]" maintain his cetirizine, zolendronic acid, ranitidine,  docusate sodium, feeding supplement (ENSURE ENLIVE), sildenafil, pantoprazole, aspirin EC, [place] complex vitamins, Vitamin D3, triamcinolone cream, Vitamin D3, hydrOXYzine, escitalopram, levothyroxine, escitalopram, valACYclovir, ondansetron, and pomalidomide.  No orders of the defined types were placed in this encounter.    Follow-up: No follow-ups on file.  [name], MD

## 2018-01-30 ENCOUNTER — Encounter: Payer: Self-pay | Admitting: Adult Health

## 2018-01-30 ENCOUNTER — Telehealth: Payer: Self-pay | Admitting: Oncology

## 2018-01-30 NOTE — Telephone Encounter (Signed)
Spoke to patient regarding upcoming may appointments per 5/15 sch message

## 2018-01-31 ENCOUNTER — Inpatient Hospital Stay: Payer: 59

## 2018-01-31 DIAGNOSIS — T451X5A Adverse effect of antineoplastic and immunosuppressive drugs, initial encounter: Secondary | ICD-10-CM

## 2018-01-31 DIAGNOSIS — Z5112 Encounter for antineoplastic immunotherapy: Secondary | ICD-10-CM | POA: Diagnosis not present

## 2018-01-31 DIAGNOSIS — D701 Agranulocytosis secondary to cancer chemotherapy: Secondary | ICD-10-CM

## 2018-01-31 DIAGNOSIS — C9000 Multiple myeloma not having achieved remission: Secondary | ICD-10-CM

## 2018-01-31 DIAGNOSIS — R9089 Other abnormal findings on diagnostic imaging of central nervous system: Secondary | ICD-10-CM

## 2018-01-31 DIAGNOSIS — Z9481 Bone marrow transplant status: Secondary | ICD-10-CM

## 2018-01-31 LAB — COMPREHENSIVE METABOLIC PANEL
ALK PHOS: 96 U/L (ref 40–150)
ALT: 49 U/L (ref 0–55)
ANION GAP: 7 (ref 3–11)
AST: 48 U/L — ABNORMAL HIGH (ref 5–34)
Albumin: 3.9 g/dL (ref 3.5–5.0)
BILIRUBIN TOTAL: 0.3 mg/dL (ref 0.2–1.2)
BUN: 30 mg/dL — ABNORMAL HIGH (ref 7–26)
CALCIUM: 9.8 mg/dL (ref 8.4–10.4)
CO2: 20 mmol/L — AB (ref 22–29)
CREATININE: 0.95 mg/dL (ref 0.70–1.30)
Chloride: 109 mmol/L (ref 98–109)
Glucose, Bld: 68 mg/dL — ABNORMAL LOW (ref 70–140)
Potassium: 4.4 mmol/L (ref 3.5–5.1)
SODIUM: 136 mmol/L (ref 136–145)
TOTAL PROTEIN: 7.5 g/dL (ref 6.4–8.3)

## 2018-01-31 LAB — CBC WITH DIFFERENTIAL/PLATELET
BASOS ABS: 0 10*3/uL (ref 0.0–0.1)
BASOS PCT: 1 %
EOS ABS: 0.1 10*3/uL (ref 0.0–0.5)
Eosinophils Relative: 4 %
HEMATOCRIT: 39.2 % (ref 38.4–49.9)
HEMOGLOBIN: 13.4 g/dL (ref 13.0–17.1)
Lymphocytes Relative: 25 %
Lymphs Abs: 1 10*3/uL (ref 0.9–3.3)
MCH: 36.3 pg — ABNORMAL HIGH (ref 27.2–33.4)
MCHC: 34.1 g/dL (ref 32.0–36.0)
MCV: 106.2 fL — ABNORMAL HIGH (ref 79.3–98.0)
MONOS PCT: 17 %
Monocytes Absolute: 0.7 10*3/uL (ref 0.1–0.9)
NEUTROS ABS: 2.1 10*3/uL (ref 1.5–6.5)
NEUTROS PCT: 53 %
Platelets: 132 10*3/uL — ABNORMAL LOW (ref 140–400)
RBC: 3.7 MIL/uL — ABNORMAL LOW (ref 4.20–5.82)
RDW: 13.3 % (ref 11.0–14.6)
WBC: 3.9 10*3/uL — AB (ref 4.0–10.3)

## 2018-02-01 LAB — KAPPA/LAMBDA LIGHT CHAINS
KAPPA, LAMDA LIGHT CHAIN RATIO: 2.29 — AB (ref 0.26–1.65)
Kappa free light chain: 32.8 mg/L — ABNORMAL HIGH (ref 3.3–19.4)
LAMDA FREE LIGHT CHAINS: 14.3 mg/L (ref 5.7–26.3)

## 2018-02-05 ENCOUNTER — Inpatient Hospital Stay: Payer: 59

## 2018-02-05 VITALS — BP 114/68 | HR 60 | Temp 97.8°F | Resp 17

## 2018-02-05 DIAGNOSIS — Z9481 Bone marrow transplant status: Secondary | ICD-10-CM

## 2018-02-05 DIAGNOSIS — C9000 Multiple myeloma not having achieved remission: Secondary | ICD-10-CM

## 2018-02-05 DIAGNOSIS — Z5112 Encounter for antineoplastic immunotherapy: Secondary | ICD-10-CM | POA: Diagnosis not present

## 2018-02-05 LAB — MULTIPLE MYELOMA PANEL, SERUM
ALBUMIN SERPL ELPH-MCNC: 3.5 g/dL (ref 2.9–4.4)
ALPHA2 GLOB SERPL ELPH-MCNC: 0.8 g/dL (ref 0.4–1.0)
Albumin/Glob SerPl: 1.1 (ref 0.7–1.7)
Alpha 1: 0.2 g/dL (ref 0.0–0.4)
B-GLOBULIN SERPL ELPH-MCNC: 1.1 g/dL (ref 0.7–1.3)
GAMMA GLOB SERPL ELPH-MCNC: 1.2 g/dL (ref 0.4–1.8)
GLOBULIN, TOTAL: 3.3 g/dL (ref 2.2–3.9)
IGG (IMMUNOGLOBIN G), SERUM: 1378 mg/dL (ref 700–1600)
IgA: 123 mg/dL (ref 61–437)
IgM (Immunoglobulin M), Srm: 53 mg/dL (ref 20–172)
Total Protein ELP: 6.8 g/dL (ref 6.0–8.5)

## 2018-02-05 MED ORDER — BORTEZOMIB CHEMO SQ INJECTION 3.5 MG (2.5MG/ML)
1.3000 mg/m2 | Freq: Once | INTRAMUSCULAR | Status: AC
  Administered 2018-02-05: 2.5 mg via SUBCUTANEOUS
  Filled 2018-02-05: qty 2.5

## 2018-02-05 MED ORDER — PROCHLORPERAZINE MALEATE 10 MG PO TABS: 10.0000 mg | ORAL_TABLET | Freq: Once | ORAL | Status: DC

## 2018-02-05 MED ORDER — PROCHLORPERAZINE MALEATE 10 MG PO TABS
ORAL_TABLET | ORAL | Status: AC
Start: 2018-02-05 — End: ?
  Filled 2018-02-05: qty 1

## 2018-02-05 NOTE — Patient Instructions (Signed)
[facility] Discharge [facility] for Patients Receiving Chemotherapy  Today you received the following chemotherapy agents Velcade To help prevent nausea and vomiting after your treatment, we encourage you to take your nausea medication as prescribed.   If you develop nausea and vomiting that is not controlled by your nausea medication, call the clinic.   BELOW ARE SYMPTOMS THAT SHOULD BE REPORTED IMMEDIATELY:  *FEVER GREATER THAN 100.5 F  *CHILLS WITH OR WITHOUT FEVER  NAUSEA AND VOMITING THAT IS NOT CONTROLLED WITH YOUR NAUSEA MEDICATION  *UNUSUAL SHORTNESS OF BREATH  *UNUSUAL BRUISING OR BLEEDING  TENDERNESS IN MOUTH AND THROAT WITH OR WITHOUT PRESENCE OF ULCERS  *URINARY PROBLEMS  *BOWEL PROBLEMS  UNUSUAL RASH Items with * indicate a potential emergency and should be followed up as soon as possible.  Feel free to call the clinic should you have any questions or concerns. The clinic phone number is [phone].  Please show the CHEMO ALERT CARD at check-in to the Emergency Department and triage nurse.   

## 2018-02-06 ENCOUNTER — Encounter: Payer: Self-pay | Admitting: Oncology

## 2018-02-14 ENCOUNTER — Ambulatory Visit: Payer: Commercial Managed Care - PPO | Admitting: Internal Medicine

## 2018-02-19 ENCOUNTER — Other Ambulatory Visit: Payer: Commercial Managed Care - PPO

## 2018-02-19 ENCOUNTER — Ambulatory Visit: Payer: Commercial Managed Care - PPO

## 2018-02-19 ENCOUNTER — Other Ambulatory Visit: Payer: 59

## 2018-02-19 ENCOUNTER — Ambulatory Visit: Payer: Self-pay

## 2018-02-20 ENCOUNTER — Inpatient Hospital Stay: Payer: 59 | Attending: Oncology

## 2018-02-20 ENCOUNTER — Inpatient Hospital Stay: Payer: 59

## 2018-02-20 VITALS — BP 122/71 | HR 65 | Temp 97.8°F | Resp 18 | Wt 178.0 lb

## 2018-02-20 DIAGNOSIS — Z9481 Bone marrow transplant status: Secondary | ICD-10-CM

## 2018-02-20 DIAGNOSIS — K279 Peptic ulcer, site unspecified, unspecified as acute or chronic, without hemorrhage or perforation: Secondary | ICD-10-CM | POA: Insufficient documentation

## 2018-02-20 DIAGNOSIS — C9000 Multiple myeloma not having achieved remission: Secondary | ICD-10-CM | POA: Diagnosis not present

## 2018-02-20 DIAGNOSIS — Z5112 Encounter for antineoplastic immunotherapy: Secondary | ICD-10-CM | POA: Diagnosis present

## 2018-02-20 DIAGNOSIS — E039 Hypothyroidism, unspecified: Secondary | ICD-10-CM | POA: Insufficient documentation

## 2018-02-20 DIAGNOSIS — I10 Essential (primary) hypertension: Secondary | ICD-10-CM | POA: Insufficient documentation

## 2018-02-20 DIAGNOSIS — R9089 Other abnormal findings on diagnostic imaging of central nervous system: Secondary | ICD-10-CM

## 2018-02-20 LAB — CBC WITH DIFFERENTIAL/PLATELET
BASOS PCT: 0 %
Basophils Absolute: 0 10*3/uL (ref 0.0–0.1)
Eosinophils Absolute: 0.1 10*3/uL (ref 0.0–0.5)
Eosinophils Relative: 2 %
HCT: 37.5 % — ABNORMAL LOW (ref 38.4–49.9)
HEMOGLOBIN: 12.9 g/dL — AB (ref 13.0–17.1)
LYMPHS ABS: 1 10*3/uL (ref 0.9–3.3)
Lymphocytes Relative: 23 %
MCH: 36.4 pg — ABNORMAL HIGH (ref 27.2–33.4)
MCHC: 34.5 g/dL (ref 32.0–36.0)
MCV: 105.6 fL — ABNORMAL HIGH (ref 79.3–98.0)
MONOS PCT: 15 %
Monocytes Absolute: 0.6 10*3/uL (ref 0.1–0.9)
NEUTROS ABS: 2.5 10*3/uL (ref 1.5–6.5)
NEUTROS PCT: 60 %
Platelets: 127 10*3/uL — ABNORMAL LOW (ref 140–400)
RBC: 3.55 MIL/uL — AB (ref 4.20–5.82)
RDW: 13 % (ref 11.0–14.6)
WBC: 4.2 10*3/uL (ref 4.0–10.3)

## 2018-02-20 LAB — COMPREHENSIVE METABOLIC PANEL
ALBUMIN: 3.8 g/dL (ref 3.5–5.0)
ALT: 27 U/L (ref 0–55)
ANION GAP: 7 (ref 3–11)
AST: 29 U/L (ref 5–34)
Alkaline Phosphatase: 85 U/L (ref 40–150)
BILIRUBIN TOTAL: 0.5 mg/dL (ref 0.2–1.2)
BUN: 28 mg/dL — AB (ref 7–26)
CALCIUM: 9.6 mg/dL (ref 8.4–10.4)
CO2: 20 mmol/L — ABNORMAL LOW (ref 22–29)
CREATININE: 0.99 mg/dL (ref 0.70–1.30)
Chloride: 108 mmol/L (ref 98–109)
GFR calc Af Amer: >60 mL/min (ref >60)
GFR calc non Af Amer: >60 mL/min (ref >60)
GLUCOSE: 81 mg/dL (ref 70–140)
Potassium: 4.7 mmol/L (ref 3.5–5.1)
Sodium: 135 mmol/L — ABNORMAL LOW (ref 136–145)
TOTAL PROTEIN: 7.2 g/dL (ref 6.4–8.3)

## 2018-02-20 MED ORDER — PROCHLORPERAZINE MALEATE 10 MG PO TABS
ORAL_TABLET | ORAL | Status: AC
  Filled 2018-02-20: qty 1

## 2018-02-20 MED ORDER — BORTEZOMIB CHEMO SQ INJECTION 3.5 MG (2.5MG/ML)
1.3000 mg/m2 | Freq: Once | INTRAMUSCULAR | Status: AC
  Administered 2018-02-20: 2.5 mg via SUBCUTANEOUS
  Filled 2018-02-20: qty 1

## 2018-02-20 MED ORDER — PROCHLORPERAZINE MALEATE 10 MG PO TABS
10.0000 mg | ORAL_TABLET | Freq: Once | ORAL | Status: AC
  Administered 2018-02-20: 10 mg via ORAL

## 2018-02-20 NOTE — Patient Instructions (Signed)
[facility] Discharge Instructions for Patients Receiving Chemotherapy  Today you received the following chemotherapy agents Velcade To help prevent nausea and vomiting after your treatment, we encourage you to take your nausea medication as prescribed.   If you develop nausea and vomiting that is not controlled by your nausea medication, call the clinic.   BELOW ARE SYMPTOMS THAT SHOULD BE REPORTED IMMEDIATELY:  *FEVER GREATER THAN 100.5 F  *CHILLS WITH OR WITHOUT FEVER  NAUSEA AND VOMITING THAT IS NOT CONTROLLED WITH YOUR NAUSEA MEDICATION  *UNUSUAL SHORTNESS OF BREATH  *UNUSUAL BRUISING OR BLEEDING  TENDERNESS IN MOUTH AND THROAT WITH OR WITHOUT PRESENCE OF ULCERS  *URINARY PROBLEMS  *BOWEL PROBLEMS  UNUSUAL RASH Items with * indicate a potential emergency and should be followed up as soon as possible.  Feel free to call the clinic should you have any questions or concerns. The clinic phone number is [phone].  Please show the CHEMO ALERT CARD at check-in to the Emergency Department and triage nurse.   

## 2018-02-24 LAB — MULTIPLE MYELOMA PANEL, SERUM
Albumin SerPl Elph-Mcnc: 3.4 g/dL (ref 2.9–4.4)
Albumin/Glob SerPl: 1.1 (ref 0.7–1.7)
Alpha 1: 0.2 g/dL (ref 0.0–0.4)
Alpha2 Glob SerPl Elph-Mcnc: 0.7 g/dL (ref 0.4–1.0)
B-Globulin SerPl Elph-Mcnc: 1 g/dL (ref 0.7–1.3)
Gamma Glob SerPl Elph-Mcnc: 1.2 g/dL (ref 0.4–1.8)
Globulin, Total: 3.2 g/dL (ref 2.2–3.9)
IgA: 118 mg/dL (ref 61–437)
IgG (Immunoglobin G), Serum: 1439 mg/dL (ref 700–1600)
IgM (Immunoglobulin M), Srm: 57 mg/dL (ref 20–172)
M Protein SerPl Elph-Mcnc: 0.2 g/dL — ABNORMAL HIGH
TOTAL PROTEIN ELP: 6.6 g/dL (ref 6.0–8.5)

## 2018-03-04 ENCOUNTER — Other Ambulatory Visit: Payer: Self-pay

## 2018-03-04 DIAGNOSIS — T451X5A Adverse effect of antineoplastic and immunosuppressive drugs, initial encounter: Secondary | ICD-10-CM

## 2018-03-04 DIAGNOSIS — D701 Agranulocytosis secondary to cancer chemotherapy: Secondary | ICD-10-CM

## 2018-03-04 DIAGNOSIS — C9000 Multiple myeloma not having achieved remission: Secondary | ICD-10-CM

## 2018-03-05 ENCOUNTER — Telehealth: Payer: Self-pay | Admitting: Oncology

## 2018-03-05 ENCOUNTER — Inpatient Hospital Stay (HOSPITAL_BASED_OUTPATIENT_CLINIC_OR_DEPARTMENT_OTHER): Payer: 59 | Admitting: Oncology

## 2018-03-05 ENCOUNTER — Inpatient Hospital Stay: Payer: 59

## 2018-03-05 VITALS — BP 122/63 | HR 81 | Temp 97.4°F | Resp 18 | Ht 69.0 in | Wt 179.3 lb

## 2018-03-05 DIAGNOSIS — I10 Essential (primary) hypertension: Secondary | ICD-10-CM

## 2018-03-05 DIAGNOSIS — K279 Peptic ulcer, site unspecified, unspecified as acute or chronic, without hemorrhage or perforation: Secondary | ICD-10-CM | POA: Diagnosis not present

## 2018-03-05 DIAGNOSIS — D701 Agranulocytosis secondary to cancer chemotherapy: Secondary | ICD-10-CM

## 2018-03-05 DIAGNOSIS — Z5112 Encounter for antineoplastic immunotherapy: Secondary | ICD-10-CM | POA: Diagnosis not present

## 2018-03-05 DIAGNOSIS — E039 Hypothyroidism, unspecified: Secondary | ICD-10-CM

## 2018-03-05 DIAGNOSIS — C9000 Multiple myeloma not having achieved remission: Secondary | ICD-10-CM

## 2018-03-05 DIAGNOSIS — Z9481 Bone marrow transplant status: Secondary | ICD-10-CM

## 2018-03-05 DIAGNOSIS — T451X5A Adverse effect of antineoplastic and immunosuppressive drugs, initial encounter: Secondary | ICD-10-CM

## 2018-03-05 LAB — CBC WITH DIFFERENTIAL (CANCER CENTER ONLY)
BASOS ABS: 0 10*3/uL (ref 0.0–0.1)
Basophils Relative: 0 %
EOS PCT: 3 %
Eosinophils Absolute: 0.2 10*3/uL (ref 0.0–0.5)
HEMATOCRIT: 38.4 % (ref 38.4–49.9)
Hemoglobin: 13 g/dL (ref 13.0–17.1)
LYMPHS PCT: 26 %
Lymphs Abs: 1.3 10*3/uL (ref 0.9–3.3)
MCH: 35.6 pg — ABNORMAL HIGH (ref 27.2–33.4)
MCHC: 33.9 g/dL (ref 32.0–36.0)
MCV: 105 fL — AB (ref 79.3–98.0)
Monocytes Absolute: 0.7 10*3/uL (ref 0.1–0.9)
Monocytes Relative: 13 %
NEUTROS ABS: 3 10*3/uL (ref 1.5–6.5)
Neutrophils Relative %: 58 %
PLATELETS: 125 10*3/uL — AB (ref 140–400)
RBC: 3.66 MIL/uL — ABNORMAL LOW (ref 4.20–5.82)
RDW: 13.4 % (ref 11.0–14.6)
WBC Count: 5.2 10*3/uL (ref 4.0–10.3)

## 2018-03-05 LAB — CMP (CANCER CENTER ONLY)
ALT: 25 U/L (ref 0–55)
AST: 28 U/L (ref 5–34)
Albumin: 3.7 g/dL (ref 3.5–5.0)
Alkaline Phosphatase: 83 U/L (ref 40–150)
Anion gap: 8 (ref 3–11)
BILIRUBIN TOTAL: 0.2 mg/dL (ref 0.2–1.2)
BUN: 39 mg/dL — AB (ref 7–26)
CHLORIDE: 106 mmol/L (ref 98–109)
CO2: 23 mmol/L (ref 22–29)
Calcium: 9.8 mg/dL (ref 8.4–10.4)
Creatinine: 1 mg/dL (ref 0.70–1.30)
GFR, Est AFR Am: >60 mL/min (ref >60)
Glucose, Bld: 84 mg/dL (ref 70–140)
POTASSIUM: 4.6 mmol/L (ref 3.5–5.1)
Sodium: 137 mmol/L (ref 136–145)
TOTAL PROTEIN: 7.1 g/dL (ref 6.4–8.3)

## 2018-03-05 MED ORDER — BORTEZOMIB CHEMO SQ INJECTION 3.5 MG (2.5MG/ML)
1.3000 mg/m2 | Freq: Once | INTRAMUSCULAR | Status: AC
Start: 2018-03-05 — End: 2018-03-05
  Administered 2018-03-05: 2.5 mg via SUBCUTANEOUS
  Filled 2018-03-05: qty 1

## 2018-03-05 MED ORDER — PROCHLORPERAZINE MALEATE 10 MG PO TABS: 10.0000 mg | ORAL_TABLET | Freq: Once | ORAL | Status: DC

## 2018-03-05 NOTE — Progress Notes (Signed)
[place]  [phone] [phone]    ID: Andre [name]  DOB: November 17, 1950  MR#: [id number] CSN#: [id number]   Patient Care Team: Andre Anger, MD as PCP - General (Internal Medicine) Magrinat, Andre Dad, MD as Consulting Physician (Oncology) Andre Living, MD as Consulting Physician (Neurology) Andre Lewandowsky, MD as Consulting Physician (Internal Medicine) Andre Ducking, MD as Consulting Physician (Neurology) Andre [name], DDS as Referring Physician (Oral Surgery)   CHIEF COMPLAINT: Multiple myeloma  CURRENT TREATMENT: Maintenance therapy with bortezomib, zolendronate, and pomalidomide  INTERVAL HISTORY:  Andre [name] returns today for a follow-up and treatment of his multiple myeloma acompanied by his wife, Andre [name]. He continues on bortezomid, with good tolerance. He takes p.o. ondansetron to prophylax nausea from the bortezomid. He denies any headaches. He has occasional constipation.  He denies neuropahty. he has slight itching on the right foot when he drives distances. He notes that his memory is decreasing. His wife notes that he was much clearer when he went off bortezomib for a few months  He also receives zolendronate every 12 weeks, with his most recent dose on 12/17/2017. He tolerated this well with no complictions   REVIEW OF SYSTEMS: Andre [name] had root canal surgery on 03/04/2018 under Dr. Dorothyann [name]. He did not suffer any complications from this. He is taking antibiotics.   He denies unusual headaches, visual changes, nausea, vomiting, or dizziness. There has been no unusual cough, phlegm production, or pleurisy. This been no change in bowel or bladder habits. He denies unexplained fatigue or unexplained weight loss, bleeding, rash, or fever. A detailed review of systems was otherwise stable.    HISTORY OF PRESENT ILLNESS:  From the original intake nodes:  The patient was worked up for peptic ulcer disease in August  of 2011, with significant bleeding and anemia.  The patient was Helicobacter pylori negative.  He received some epinephrine when he had his EGD and then started on Protonix.  [name] anemia slowly resolved so that by September, his hemoglobin was up to 10.9 and by earlier this month, his hemoglobin was up to 12.5.    As part of his general workup, he was found to have a slightly elevated globulin fraction.  In September, his total protein was 8.3 with an albumin of 3.8.  In January, the total protein was 8.4 and albumin 3.6.  With persistence of this slight abnormality, Dr. Olevia [name] obtained serum immunofixation and SPEP.  The SPTP showed an M-spike of 2.67 grams.  A total IgG was 4,190.  Total IgA low at 28.  Total IgM low at 28 also.  The immunofixation showed a monoclonal IgG lambda paraprotein.  There were also monoclonal free lambda light chains present. With this information, the patient was referred for further evaluation.   A diagnosis of myeloma was confirmed by bone marrow biopsy and subsequebnt treatment is as detailed below  PAST MEDICAL HISTORY: Past Medical History:  Diagnosis Date  . Allergy   . Anemia   . Arthritis   . Asthma    no treatment x 20 years  . Depression   . Double vision    occurs at times   . Duodenal ulcer   . GERD (gastroesophageal reflux disease)   . Hyperlipidemia   . Hypertension   . Hypothyroidism   . Multiple myeloma 07/04/2011  . Thyroid disease   Significant for peptic ulcer disease as noted above.  History of hyperlipidemia.  History of anxiety and depression.  History  of GERD which is significantly improved with weight loss and history of reactive airway disease, possibly secondary to the GERD which also improved with weight loss.  There was a history of obesity, now much improved secondary to exercise and diet.   PAST SURGICAL HISTORY: Past Surgical History:  Procedure Laterality Date  . BONE MARROW TRANSPLANT  2011   for MM  . CARDIOLITE  STUDY  11/25/2003   NORMAL  . TONSILLECTOMY      FAMILY HISTORY Family History  Problem Relation Age of Onset  . Throat cancer Mother   . Hypertension Father   . Stroke Father   . Asthma Father   . Diabetes Father   . Pancreatic cancer Brother   [name] father died at the age of 76 following a stroke.  [name] mother died with cancer of the throat at the age of 79.   She had a history of depression and was a smoker.  Not clear how much alcohol she drank, according to the patient.  The patient had two brothers.  One died from the age of 43 from a "kidney problem."  The second one died at the age of 66 from pancreatic cancer. (This may have been duodenal cancer. 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  His wife of 37+ years was Andre [name], but they are in process of divorce, to be finalized August 2018. In 2016 he remarried, his current wife's name is Andre [name].  The patient has two daughters:  Andre [name], who works in [employer], who had her second child April 2016. Both daughters live here in [place]. The patient is very close to his daughters.  [place] first daughter, Andre [name], born 03/06/2013, apparently has Weber/Osler/Rendu. The patient is not a church attender.  Andre [name] also participates in [employer] even though actually there is no alcohol or drug problem in the family.  He participates in this group and he gets quite a bit of support from it.     ADVANCED DIRECTIVES: In place  HEALTH MAINTENANCE: (updated 07/16/2013) Social History   Tobacco Use  . Smoking status: Former Smoker    Years: 3.00    Last attempt to quit: 03/27/1969    Years since quitting: 48.9  . Smokeless tobacco: Never Used  Substance Use Topics  . Alcohol use: No  . Drug use: No    Colonoscopy:  PSA:  Sept 2014, normal/Dr. [name]  Lipid  panel: Sept 2014/Dr. [name]  Allergies  Allergen Reactions  . Atorvastatin Other (See Comments)  . Rosuvastatin     Other reaction(s): Liver Disorder  . Crestor [Rosuvastatin Calcium]     ADVERSE EFFECTS ON LIVER  . Lipitor [Atorvastatin Calcium]   . Septra [Sulfamethoxazole-Trimethoprim] Rash   Current Outpatient Medications  Medication Sig Dispense Refill  . aspirin EC 81 MG tablet Take 1 tablet (81 mg total) by mouth daily. 100 tablet 3  . Venson complex vitamins tablet Take 1 tablet by mouth daily. 100 tablet 3  . cetirizine (ZYRTEC) 10 MG tablet Take 10 mg by mouth daily.    . Cholecalciferol (VITAMIN D3) 2000 units capsule Take 1 capsule (2,000 Units total) by mouth daily. 100 capsule 3  . docusate sodium (COLACE) 100 MG capsule Take 100 mg by mouth daily as needed for mild constipation or moderate constipation.    [name]  escitalopram (LEXAPRO) 10 MG tablet TAKE 1 TABLET BY MOUTH  DAILY 90 tablet 3  . feeding supplement, ENSURE ENLIVE, (ENSURE ENLIVE) LIQD Take 237 mLs by mouth 2 (two) times daily between meals. 237 mL 12  . hydrOXYzine (ATARAX/VISTARIL) 25 MG tablet Take 1 tablet (25 mg total) by mouth 3 (three) times daily as needed. 30 tablet 0  . levothyroxine (SYNTHROID, LEVOTHROID) 150 MCG tablet TAKE 1 TABLET BY MOUTH  EVERY MORNING ON AN EMPTY  STOMACH 90 tablet 3  . ondansetron (ZOFRAN) 8 MG tablet Take 1 tablet (8 mg total) by mouth every 8 (eight) hours as needed for nausea or vomiting. 20 tablet 0  . pantoprazole (PROTONIX) 40 MG tablet TAKE 1 TABLET BY MOUTH  TWICE A DAY BEFORE MEALS 180 tablet 3  . pomalidomide (POMALYST) 1 MG capsule TAKE 1 CAPSULE BY MOUTH EVERY DAY FOR 21 DAYS, THEN 7 DAYS OFF    . ranitidine (ZANTAC) 150 MG tablet Take 150 mg by mouth 2 (two) times daily.    . sildenafil (VIAGRA) 25 MG tablet Take 1 tablet (25 mg total) by mouth daily as needed for erectile dysfunction. 10 tablet 0  . triamcinolone cream (KENALOG) 0.5 % Apply 1 application topically 4  (four) times daily. 60 g 2  . valACYclovir (VALTREX) 1000 MG tablet Take 1 tablet (1,000 mg total) by mouth daily. 90 tablet 3  . zolendronic acid (ZOMETA) 4 MG/5ML injection Inject 4 mg into the vein every 3 (three) months.     No current facility-administered medications for this visit.    Objective: Middle-aged white man who appears stated age  36:   03/05/18 1208  BP: 122/63  Pulse: 81  Resp: 18  Temp: (!) 97.4 F (36.3 C)  SpO2: 98%  Body mass index is 26.48 kg/m. Filed Weights   03/05/18 1208  Weight: 179 lb 4.8 oz (81.3 kg)   Sclerae unicteric, EOMs intact Oropharynx clear and moist No cervical or supraclavicular adenopathy Lungs no rales or rhonchi Heart regular rate and rhythm Abd soft, nontender, positive bowel sounds MSK no focal spinal tenderness, no upper extremity lymphedema Neuro: nonfocal, well oriented, appropriate affect   Appointment on 03/05/2018  Component Date Value Ref Range Status  . WBC Count 03/05/2018 5.2  4.0 - 10.3 K/uL Final  . RBC 03/05/2018 3.66* 4.20 - 5.82 MIL/uL Final  . Hemoglobin 03/05/2018 13.0  13.0 - 17.1 g/dL Final  . HCT 03/05/2018 38.4  38.4 - 49.9 % Final  . MCV 03/05/2018 105.0* 79.3 - 98.0 fL Final  . MCH 03/05/2018 35.6* 27.2 - 33.4 pg Final  . MCHC 03/05/2018 33.9  32.0 - 36.0 g/dL Final  . RDW 03/05/2018 13.4  11.0 - 14.6 % Final  . Platelet Count 03/05/2018 125* 140 - 400 K/uL Final  . Neutrophils Relative % 03/05/2018 58  % Final  . Neutro Abs 03/05/2018 3.0  1.5 - 6.5 K/uL Final  . Lymphocytes Relative 03/05/2018 26  % Final  . Lymphs Abs 03/05/2018 1.3  0.9 - 3.3 K/uL Final  . Monocytes Relative 03/05/2018 13  % Final  . Monocytes Absolute 03/05/2018 0.7  0.1 - 0.9 K/uL Final  . Eosinophils Relative 03/05/2018 3  % Final  . Eosinophils Absolute 03/05/2018 0.2  0.0 - 0.5 K/uL Final  . Basophils Relative 03/05/2018 0  % Final  . Basophils Absolute 03/05/2018 0.0  0.0 - 0.1 K/uL Final   Performed at [facility], [address] [place],  [place] [address]  . Sodium 03/05/2018 137  136 - 145 mmol/L Final  . Potassium 03/05/2018 4.6  3.5 - 5.1 mmol/L Final  . Chloride 03/05/2018 106  98 - 109 mmol/L Final  . CO2 03/05/2018 23  22 - 29 mmol/L Final  . Glucose, Bld 03/05/2018 84  70 - 140 mg/dL Final  . BUN 03/05/2018 39* 7 - 26 mg/dL Final  . Creatinine 03/05/2018 1.00  0.70 - 1.30 mg/dL Final  . Calcium 03/05/2018 9.8  8.4 - 10.4 mg/dL Final  . Total Protein 03/05/2018 7.1  6.4 - 8.3 g/dL Final  . Albumin 03/05/2018 3.7  3.5 - 5.0 g/dL Final  . AST 03/05/2018 28  5 - 34 U/L Final  . ALT 03/05/2018 25  0 - 55 U/L Final  . Alkaline Phosphatase 03/05/2018 83  40 - 150 U/L Final  . Total Bilirubin 03/05/2018 0.2  0.2 - 1.2 mg/dL Final  . GFR, Est Non Af Am 03/05/2018 >60  >60 mL/min Final  . GFR, Est AFR Am 03/05/2018 >60  >60 mL/min Final   Comment: (NOTE) The eGFR has been calculated using the CKD EPI equation. This calculation has not been validated in all clinical situations. eGFR's persistently <60 mL/min signify possible Chronic Kidney Disease.   Georgiann Hahn gap 03/05/2018 8  3 - 11 Final   Performed at [facility], [place] [address] [place], [place] [address]     STUDIES:  We reviewed his M protein, which is minimally up at 0.2, with his kappa/lambda pending  ASSESSMENT: 67 y.o. Andre [name] man with a history of multiple myeloma diagnosed February of 2012, with an initial M-spike of 2.6 g/dL, IFE showing an IgG lambda paraprotein and free lambda light chains in the urine. Cytogenetics showed trisomy 28. Bone marrow biopsy showed a 22% plasmacytosis. Treated with   (1) bortezomib (subcutaneously), lenalidomide, and dexamethasone, with repeat bone marrow biopsy May of 2012 showing 10% plasmacytosis   (2) high-dose chemotherapy with BCNU and melphalan at [facility], followed by stem cell rescue July of 2012   (3) on zoledronic acid started December  of 2012, initially monthly, currently given every 3 months, most recent dose  12/07/2015  (4) low-dose lenalidomide resumed April 2013, interrupted several times.  Resumed again on 02/19/2013, ata dose of 5 mg daily, 21 days on and 7 days off, later further reduced to 7 days on, 7 days off  (5) CNS symptoms and abnormal brain MRI extensively evaluated by neurology with no definitive diagnosis established  (6) rising M spike noted June 2014 but did not meet criteria for carfilzomib study  (7) on lenalidomide 25 mg daily, 14 days on, 7 days off, starting 04/18/2013, interrupted December 2014 because of rash;   (a) resumed January 2015 at 10 mg/ day at 21 days on/ 7 days off  (Mindy) starting 08/18/2014 decreased to 10 mg/ day 14 days on, 7 days off because of cytopenias  (c) as of February 2016 was on 5 mg daily 7 days on 7 days off  (d) lenalidomide discontinued December 2016 with evidence of disease progression  (8) transient global amnesia 05/29/2015, resolved without intervention  (9) starting PVD 10/25/2015 w ASA 325 thromboprophylaxis, valacyclovir prophylaxis, last dose 12/17/2015  (a) pomalidomide 4 mg/d days 1-14  (Haroon) bortezomib sQ days 2,5,9,12 of each 21 day cycle  (c) dexamethasome 20 mg two days a week  (d) dexamethasone bortezomib and pomalidomide discontinued late December 2018 with poor tolerance  (10) metapneumovirus pneumonia April  2017  (a) completing course of steroids and week of bactrim mid April 2017  (11) status post second autologous transplant at [facility] 02/04/2016(preparatory regimen melphalan 200 mg/m)  (a) received twelve-month vaccinations 03/14/2017 (DPT, Haemophilus, Pneumovax 13, polio)  (Liborio) 14 months injections 05/04/2016 include DTaP, Hib conjugate, HepB energex Lavarr 20 mcg/ml, Prevnar 13  (c) 24 month vaccines due at [facility] June 2019  (12) maintenance therapy started November 2017, consisting of  (a) bortezomib 1.3 mg/M2 QOW, first dose 07/27/2016  (Aren)  pomalidomide 1 mg days 1-21 Q28 days, started 07/19/2016  (c) zolendronate monthly started 07/27/2016 (previously Q 3 months) however patient unable to tolerate, and changed back to q3 month in April, 2018   (d) Bortezomib changed to monthly as of June 2018 because of tolerance issues, however discontinued after 09/14/2017 dose because of a rise in his M spike.  (e) pomalidomide held after 10/18/2017 in preparation for possible study at [place] (venetoclax)  (f) with numbers actually improved off treatment, resumed every 2-week bortezomib 12/25/2017  PLAN:  Andre [name] is doing remarkably well on his current treatment plan.  He really is having no significant side effects from the bortezomib although there is perhaps some fatigue and perhaps some mental fogginess which comes and goes.  He does have a chronic constipation problem and it is not clear to me that the bortezomib is involved although the Zofran he takes to prevent nausea probably does contribute  He will be seen at [facility] tomorrow.  I suspect with his current counts he will not qualify for a study but I have strongly encouraged her to keep his mind open because if it is disease progresses a study would be the very best option for him  [place]  Accordingly we are giving him a treatment next week and he will receive Zometa at the same time.  His next treatment after that would be on July 17.  He will see me again in August.  He knows to call for any other issues that may develop before the next visit.     Magrinat, Andre Dad, MD  03/05/18 12:59 PM Medical Oncology and Hematology [facility] [address] [place], [place] [address] Tel. [phone]    Fax. [phone]  Alice Rieger, am acting as scribe for Chauncey Cruel MD.  I, Lurline Del MD, have reviewed the above documentation for accuracy and completeness, and I agree with the above.

## 2018-03-05 NOTE — Patient Instructions (Signed)
[facility] Discharge Instructions for Patients Receiving Chemotherapy  Today you received the following chemotherapy agents Velcade.  To help prevent nausea and vomiting after your treatment, we encourage you to take your nausea medication as directed.  If you develop nausea and vomiting that is not controlled by your nausea medication, call the clinic.   BELOW ARE SYMPTOMS THAT SHOULD BE REPORTED IMMEDIATELY:  *FEVER GREATER THAN 100.5 F  *CHILLS WITH OR WITHOUT FEVER  NAUSEA AND VOMITING THAT IS NOT CONTROLLED WITH YOUR NAUSEA MEDICATION  *UNUSUAL SHORTNESS OF BREATH  *UNUSUAL BRUISING OR BLEEDING  TENDERNESS IN MOUTH AND THROAT WITH OR WITHOUT PRESENCE OF ULCERS  *URINARY PROBLEMS  *BOWEL PROBLEMS  UNUSUAL RASH Items with * indicate a potential emergency and should be followed up as soon as possible.  Feel free to call the clinic should you have any questions or concerns. The clinic phone number is [phone].  Please show the CHEMO ALERT CARD at check-in to the Emergency Department and triage nurse.   

## 2018-03-05 NOTE — Telephone Encounter (Signed)
Gave patient avs and calendar of upcoming June through September appointments.

## 2018-03-06 ENCOUNTER — Other Ambulatory Visit: Payer: Self-pay | Admitting: Oncology

## 2018-03-06 DIAGNOSIS — C9000 Multiple myeloma not having achieved remission: Secondary | ICD-10-CM | POA: Diagnosis not present

## 2018-03-06 DIAGNOSIS — Z9484 Stem cells transplant status: Secondary | ICD-10-CM | POA: Diagnosis not present

## 2018-03-06 DIAGNOSIS — Z23 Encounter for immunization: Secondary | ICD-10-CM | POA: Diagnosis not present

## 2018-03-06 LAB — KAPPA/LAMBDA LIGHT CHAINS
KAPPA, LAMDA LIGHT CHAIN RATIO: 2.02 — AB (ref 0.26–1.65)
Kappa free light chain: 27.9 mg/L — ABNORMAL HIGH (ref 3.3–19.4)
Lambda free light chains: 13.8 mg/L (ref 5.7–26.3)

## 2018-03-07 ENCOUNTER — Telehealth: Payer: Self-pay | Admitting: Oncology

## 2018-03-07 NOTE — Telephone Encounter (Signed)
Spoke to patient regarding upcoming June appointments  °

## 2018-03-12 ENCOUNTER — Other Ambulatory Visit: Payer: Self-pay | Admitting: Oncology

## 2018-03-12 ENCOUNTER — Inpatient Hospital Stay: Payer: 59

## 2018-03-12 VITALS — BP 120/65 | HR 53 | Temp 98.2°F | Resp 18

## 2018-03-12 DIAGNOSIS — R9089 Other abnormal findings on diagnostic imaging of central nervous system: Secondary | ICD-10-CM

## 2018-03-12 DIAGNOSIS — T451X5A Adverse effect of antineoplastic and immunosuppressive drugs, initial encounter: Secondary | ICD-10-CM

## 2018-03-12 DIAGNOSIS — C9002 Multiple myeloma in relapse: Secondary | ICD-10-CM

## 2018-03-12 DIAGNOSIS — Z9481 Bone marrow transplant status: Secondary | ICD-10-CM

## 2018-03-12 DIAGNOSIS — C9000 Multiple myeloma not having achieved remission: Secondary | ICD-10-CM

## 2018-03-12 DIAGNOSIS — D701 Agranulocytosis secondary to cancer chemotherapy: Secondary | ICD-10-CM

## 2018-03-12 DIAGNOSIS — Z5112 Encounter for antineoplastic immunotherapy: Secondary | ICD-10-CM | POA: Diagnosis not present

## 2018-03-12 LAB — CMP (CANCER CENTER ONLY)
ALK PHOS: 67 U/L (ref 38–126)
ALT: 23 U/L (ref 0–44)
AST: 27 U/L (ref 15–41)
Albumin: 3.7 g/dL (ref 3.5–5.0)
Anion gap: 9 (ref 5–15)
BILIRUBIN TOTAL: 0.3 mg/dL (ref 0.3–1.2)
BUN: 32 mg/dL — AB (ref 8–23)
CALCIUM: 9.7 mg/dL (ref 8.9–10.3)
CO2: 22 mmol/L (ref 22–32)
CREATININE: 0.91 mg/dL (ref 0.61–1.24)
Chloride: 106 mmol/L (ref 98–111)
GFR, Est AFR Am: >60 mL/min (ref >60)
Glucose, Bld: 73 mg/dL (ref 70–99)
Potassium: 4.4 mmol/L (ref 3.5–5.1)
Sodium: 137 mmol/L (ref 135–145)
TOTAL PROTEIN: 7 g/dL (ref 6.5–8.1)

## 2018-03-12 LAB — CBC WITH DIFFERENTIAL (CANCER CENTER ONLY)
BASOS ABS: 0 10*3/uL (ref 0.0–0.1)
Basophils Relative: 1 %
Eosinophils Absolute: 0.2 10*3/uL (ref 0.0–0.5)
Eosinophils Relative: 4 %
HEMATOCRIT: 36.9 % — AB (ref 38.4–49.9)
Hemoglobin: 12.6 g/dL — ABNORMAL LOW (ref 13.0–17.1)
LYMPHS ABS: 1 10*3/uL (ref 0.9–3.3)
LYMPHS PCT: 26 %
MCH: 35.9 pg — AB (ref 27.2–33.4)
MCHC: 34.2 g/dL (ref 32.0–36.0)
MCV: 105 fL — ABNORMAL HIGH (ref 79.3–98.0)
Monocytes Absolute: 0.7 10*3/uL (ref 0.1–0.9)
Monocytes Relative: 18 %
NEUTROS ABS: 2.1 10*3/uL (ref 1.5–6.5)
Neutrophils Relative %: 51 %
Platelet Count: 105 10*3/uL — ABNORMAL LOW (ref 140–400)
RBC: 3.51 MIL/uL — AB (ref 4.20–5.82)
RDW: 13.3 % (ref 11.0–14.6)
WBC: 4 10*3/uL (ref 4.0–10.3)

## 2018-03-12 MED ORDER — PROCHLORPERAZINE MALEATE 10 MG PO TABS
ORAL_TABLET | ORAL | Status: AC
  Filled 2018-03-12: qty 1

## 2018-03-12 MED ORDER — SODIUM CHLORIDE 0.9 % IV SOLN
Freq: Once | INTRAVENOUS | Status: AC
  Administered 2018-03-12: 09:00:00 via INTRAVENOUS

## 2018-03-12 MED ORDER — ZOLEDRONIC ACID 4 MG/100ML IV SOLN
4.0000 mg | Freq: Once | INTRAVENOUS | Status: AC
  Administered 2018-03-12: 4 mg via INTRAVENOUS
  Filled 2018-03-12: qty 100

## 2018-03-12 MED ORDER — BORTEZOMIB CHEMO SQ INJECTION 3.5 MG (2.5MG/ML)
1.3000 mg/m2 | Freq: Once | INTRAMUSCULAR | Status: AC
  Administered 2018-03-12: 2.5 mg via SUBCUTANEOUS
  Filled 2018-03-12: qty 1

## 2018-03-12 MED ORDER — PROCHLORPERAZINE MALEATE 10 MG PO TABS
10.0000 mg | ORAL_TABLET | Freq: Once | ORAL | Status: AC
  Administered 2018-03-12: 10 mg via ORAL

## 2018-03-12 NOTE — Patient Instructions (Signed)
[facility] Discharge Instructions for Patients Receiving Chemotherapy  Today you received the following chemotherapy agents Velcade  To help prevent nausea and vomiting after your treatment, we encourage you to take your nausea medication as directed If you develop nausea and vomiting that is not controlled by your nausea medication, call the clinic.   BELOW ARE SYMPTOMS THAT SHOULD BE REPORTED IMMEDIATELY:  *FEVER GREATER THAN 100.5 F  *CHILLS WITH OR WITHOUT FEVER  NAUSEA AND VOMITING THAT IS NOT CONTROLLED WITH YOUR NAUSEA MEDICATION  *UNUSUAL SHORTNESS OF BREATH  *UNUSUAL BRUISING OR BLEEDING  TENDERNESS IN MOUTH AND THROAT WITH OR WITHOUT PRESENCE OF ULCERS  *URINARY PROBLEMS  *BOWEL PROBLEMS  UNUSUAL RASH Items with * indicate a potential emergency and should be followed up as soon as possible.  Feel free to call the clinic should you have any questions or concerns. The clinic phone number is [phone].  Please show the CHEMO ALERT CARD at check-in to the Emergency Department and triage nurse.   Zoledronic Acid injection (Hypercalcemia, Oncology) What is this medicine? ZOLEDRONIC ACID (ZOE le dron ik AS id) lowers the amount of calcium loss from bone. It is used to treat too much calcium in your blood from cancer. It is also used to prevent complications of cancer that has spread to the bone. This medicine may be used for other purposes; ask your health care provider or pharmacist if you have questions. COMMON BRAND NAME(S): Zometa What should I tell my health care provider before I take this medicine? They need to know if you have any of these conditions: -aspirin-sensitive asthma -cancer, especially if you are receiving medicines used to treat cancer -dental disease or wear dentures -infection -kidney disease -receiving corticosteroids like dexamethasone or prednisone -an unusual or allergic reaction to zoledronic acid, other medicines,  foods, dyes, or preservatives -pregnant or trying to get pregnant -breast-feeding How should I use this medicine? This medicine is for infusion into a vein. It is given by a health care professional in a hospital or clinic setting. Talk to your pediatrician regarding the use of this medicine in children. Special care may be needed. Overdosage: If you think you have taken too much of this medicine contact a poison control center or emergency room at once. NOTE: This medicine is only for you. Do not share this medicine with others. What if I miss a dose? It is important not to miss your dose. Call your doctor or health care professional if you are unable to keep an appointment. What may interact with this medicine? -certain antibiotics given by injection -NSAIDs, medicines for pain and inflammation, like ibuprofen or naproxen -some diuretics like bumetanide, furosemide -teriparatide -thalidomide This list may not describe all possible interactions. Give your health care provider a list of all the medicines, herbs, non-prescription drugs, or dietary supplements you use. Also tell them if you smoke, drink alcohol, or use illegal drugs. Some items may interact with your medicine. What should I watch for while using this medicine? Visit your doctor or health care professional for regular checkups. It may be some time before you see the benefit from this medicine. Do not stop taking your medicine unless your doctor tells you to. Your doctor may order blood tests or other tests to see how you are doing. Women should inform their doctor if they wish to become pregnant or think they might be pregnant. There is a potential for serious side effects to an unborn child. Talk to your   health care professional or pharmacist for more information. You should make sure that you get enough calcium and vitamin D while you are taking this medicine. Discuss the foods you eat and the vitamins you take with your health  care professional. Some people who take this medicine have severe bone, joint, and/or muscle pain. This medicine may also increase your risk for jaw problems or a broken thigh bone. Tell your doctor right away if you have severe pain in your jaw, bones, joints, or muscles. Tell your doctor if you have any pain that does not go away or that gets worse. Tell your dentist and dental surgeon that you are taking this medicine. You should not have major dental surgery while on this medicine. See your dentist to have a dental exam and fix any dental problems before starting this medicine. Take good care of your teeth while on this medicine. Make sure you see your dentist for regular follow-up appointments. What side effects may I notice from receiving this medicine? Side effects that you should report to your doctor or health care professional as soon as possible: -allergic reactions like skin rash, itching or hives, swelling of the face, lips, or tongue -anxiety, confusion, or depression -breathing problems -changes in vision -eye pain -feeling faint or lightheaded, falls -jaw pain, especially after dental work -mouth sores -muscle cramps, stiffness, or weakness -redness, blistering, peeling or loosening of the skin, including inside the mouth -trouble passing urine or change in the amount of urine Side effects that usually do not require medical attention (report to your doctor or health care professional if they continue or are bothersome): -bone, joint, or muscle pain -constipation -diarrhea -fever -hair loss -irritation at site where injected -loss of appetite -nausea, vomiting -stomach upset -trouble sleeping -trouble swallowing -weak or tired This list may not describe all possible side effects. Call your doctor for medical advice about side effects. You may report side effects to FDA at 1-800-FDA-1088. Where should I keep my medicine? This drug is given in a hospital or clinic and  will not be stored at home. NOTE: This sheet is a summary. It may not cover all possible information. If you have questions about this medicine, talk to your doctor, pharmacist, or health care provider.  2018 Elsevier/Gold Standard (2014-01-31 14:19:39)    

## 2018-03-14 ENCOUNTER — Ambulatory Visit: Payer: Commercial Managed Care - PPO | Admitting: Oncology

## 2018-03-14 LAB — MULTIPLE MYELOMA PANEL, SERUM
ALPHA2 GLOB SERPL ELPH-MCNC: 0.7 g/dL (ref 0.4–1.0)
Albumin SerPl Elph-Mcnc: 3.4 g/dL (ref 2.9–4.4)
Albumin/Glob SerPl: 1.1 (ref 0.7–1.7)
Alpha 1: 0.2 g/dL (ref 0.0–0.4)
B-GLOBULIN SERPL ELPH-MCNC: 1 g/dL (ref 0.7–1.3)
GAMMA GLOB SERPL ELPH-MCNC: 1.2 g/dL (ref 0.4–1.8)
Globulin, Total: 3.2 g/dL (ref 2.2–3.9)
IgA: 108 mg/dL (ref 61–437)
IgG (Immunoglobin G), Serum: 1356 mg/dL (ref 700–1600)
IgM (Immunoglobulin M), Srm: 60 mg/dL (ref 20–172)
M PROTEIN SERPL ELPH-MCNC: 0.3 g/dL — AB
TOTAL PROTEIN ELP: 6.6 g/dL (ref 6.0–8.5)

## 2018-03-19 ENCOUNTER — Ambulatory Visit: Payer: Commercial Managed Care - PPO

## 2018-04-02 ENCOUNTER — Ambulatory Visit: Payer: Commercial Managed Care - PPO

## 2018-04-03 ENCOUNTER — Inpatient Hospital Stay: Payer: 59 | Attending: Oncology

## 2018-04-03 ENCOUNTER — Inpatient Hospital Stay: Payer: 59

## 2018-04-03 VITALS — BP 145/75 | HR 56 | Temp 98.0°F | Resp 16

## 2018-04-03 DIAGNOSIS — Z9481 Bone marrow transplant status: Secondary | ICD-10-CM

## 2018-04-03 DIAGNOSIS — R9089 Other abnormal findings on diagnostic imaging of central nervous system: Secondary | ICD-10-CM

## 2018-04-03 DIAGNOSIS — C9000 Multiple myeloma not having achieved remission: Secondary | ICD-10-CM | POA: Insufficient documentation

## 2018-04-03 DIAGNOSIS — D701 Agranulocytosis secondary to cancer chemotherapy: Secondary | ICD-10-CM

## 2018-04-03 DIAGNOSIS — Z5112 Encounter for antineoplastic immunotherapy: Secondary | ICD-10-CM | POA: Insufficient documentation

## 2018-04-03 DIAGNOSIS — T451X5A Adverse effect of antineoplastic and immunosuppressive drugs, initial encounter: Secondary | ICD-10-CM

## 2018-04-03 LAB — CBC WITH DIFFERENTIAL (CANCER CENTER ONLY)
Basophils Absolute: 0 10*3/uL (ref 0.0–0.1)
Basophils Relative: 0 %
EOS ABS: 0.2 10*3/uL (ref 0.0–0.5)
EOS PCT: 3 %
HCT: 36.1 % — ABNORMAL LOW (ref 38.4–49.9)
Hemoglobin: 12.3 g/dL — ABNORMAL LOW (ref 13.0–17.1)
LYMPHS ABS: 1.2 10*3/uL (ref 0.9–3.3)
Lymphocytes Relative: 23 %
MCH: 35.2 pg — AB (ref 27.2–33.4)
MCHC: 34.1 g/dL (ref 32.0–36.0)
MCV: 103.4 fL — ABNORMAL HIGH (ref 79.3–98.0)
MONO ABS: 0.8 10*3/uL (ref 0.1–0.9)
MONOS PCT: 16 %
Neutro Abs: 2.9 10*3/uL (ref 1.5–6.5)
Neutrophils Relative %: 58 %
Platelet Count: 123 10*3/uL — ABNORMAL LOW (ref 140–400)
RBC: 3.49 MIL/uL — ABNORMAL LOW (ref 4.20–5.82)
RDW: 14 % (ref 11.0–14.6)
WBC Count: 5 10*3/uL (ref 4.0–10.3)

## 2018-04-03 LAB — CMP (CANCER CENTER ONLY)
ALK PHOS: 76 U/L (ref 38–126)
ALT: 19 U/L (ref 0–44)
AST: 25 U/L (ref 15–41)
Albumin: 3.8 g/dL (ref 3.5–5.0)
Anion gap: 5 (ref 5–15)
BUN: 29 mg/dL — AB (ref 8–23)
CHLORIDE: 105 mmol/L (ref 98–111)
CO2: 23 mmol/L (ref 22–32)
CREATININE: 0.97 mg/dL (ref 0.61–1.24)
Calcium: 9.3 mg/dL (ref 8.9–10.3)
GFR, Est AFR Am: >60 mL/min (ref >60)
Glucose, Bld: 87 mg/dL (ref 70–99)
Potassium: 4.3 mmol/L (ref 3.5–5.1)
SODIUM: 133 mmol/L — AB (ref 135–145)
Total Bilirubin: 0.4 mg/dL (ref 0.3–1.2)
Total Protein: 7.1 g/dL (ref 6.5–8.1)

## 2018-04-03 MED ORDER — BORTEZOMIB CHEMO SQ INJECTION 3.5 MG (2.5MG/ML)
1.3000 mg/m2 | Freq: Once | INTRAMUSCULAR | Status: AC
  Administered 2018-04-03: 2.5 mg via SUBCUTANEOUS
  Filled 2018-04-03: qty 1

## 2018-04-03 MED ORDER — PROCHLORPERAZINE MALEATE 10 MG PO TABS: 10.0000 mg | ORAL_TABLET | Freq: Once | ORAL | Status: DC

## 2018-04-03 NOTE — Patient Instructions (Signed)
[facility] Discharge Instructions for Patients Receiving Chemotherapy  Today you received the following chemotherapy agents Velcade.  To help prevent nausea and vomiting after your treatment, we encourage you to take your nausea medication as directed.  If you develop nausea and vomiting that is not controlled by your nausea medication, call the clinic.   BELOW ARE SYMPTOMS THAT SHOULD BE REPORTED IMMEDIATELY:  *FEVER GREATER THAN 100.5 F  *CHILLS WITH OR WITHOUT FEVER  NAUSEA AND VOMITING THAT IS NOT CONTROLLED WITH YOUR NAUSEA MEDICATION  *UNUSUAL SHORTNESS OF BREATH  *UNUSUAL BRUISING OR BLEEDING  TENDERNESS IN MOUTH AND THROAT WITH OR WITHOUT PRESENCE OF ULCERS  *URINARY PROBLEMS  *BOWEL PROBLEMS  UNUSUAL RASH Items with * indicate a potential emergency and should be followed up as soon as possible.  Feel free to call the clinic should you have any questions or concerns. The clinic phone number is [phone].  Please show the CHEMO ALERT CARD at check-in to the Emergency Department and triage nurse.   

## 2018-04-04 LAB — MULTIPLE MYELOMA PANEL, SERUM
ALBUMIN SERPL ELPH-MCNC: 3.1 g/dL (ref 2.9–4.4)
ALBUMIN/GLOB SERPL: 1 (ref 0.7–1.7)
Alpha 1: 0.2 g/dL (ref 0.0–0.4)
Alpha2 Glob SerPl Elph-Mcnc: 0.7 g/dL (ref 0.4–1.0)
B-Globulin SerPl Elph-Mcnc: 0.9 g/dL (ref 0.7–1.3)
GAMMA GLOB SERPL ELPH-MCNC: 1.4 g/dL (ref 0.4–1.8)
GLOBULIN, TOTAL: 3.3 g/dL (ref 2.2–3.9)
IgA: 105 mg/dL (ref 61–437)
IgG (Immunoglobin G), Serum: 1329 mg/dL (ref 700–1600)
IgM (Immunoglobulin M), Srm: 57 mg/dL (ref 20–172)
Total Protein ELP: 6.4 g/dL (ref 6.0–8.5)

## 2018-04-04 LAB — KAPPA/LAMBDA LIGHT CHAINS
Kappa free light chain: 28 mg/L — ABNORMAL HIGH (ref 3.3–19.4)
Kappa, lambda light chain ratio: 2.11 — ABNORMAL HIGH (ref 0.26–1.65)
LAMDA FREE LIGHT CHAINS: 13.3 mg/L (ref 5.7–26.3)

## 2018-04-17 ENCOUNTER — Inpatient Hospital Stay: Payer: 59

## 2018-04-17 VITALS — BP 137/71 | HR 60 | Temp 97.8°F | Resp 17

## 2018-04-17 DIAGNOSIS — Z9481 Bone marrow transplant status: Secondary | ICD-10-CM

## 2018-04-17 DIAGNOSIS — C9000 Multiple myeloma not having achieved remission: Secondary | ICD-10-CM

## 2018-04-17 DIAGNOSIS — T451X5A Adverse effect of antineoplastic and immunosuppressive drugs, initial encounter: Secondary | ICD-10-CM

## 2018-04-17 DIAGNOSIS — D701 Agranulocytosis secondary to cancer chemotherapy: Secondary | ICD-10-CM

## 2018-04-17 DIAGNOSIS — Z5112 Encounter for antineoplastic immunotherapy: Secondary | ICD-10-CM | POA: Diagnosis not present

## 2018-04-17 LAB — CBC WITH DIFFERENTIAL (CANCER CENTER ONLY)
Basophils Absolute: 0 10*3/uL (ref 0.0–0.1)
Basophils Relative: 0 %
EOS ABS: 0.1 10*3/uL (ref 0.0–0.5)
Eosinophils Relative: 2 %
HEMATOCRIT: 37.8 % — AB (ref 38.4–49.9)
HEMOGLOBIN: 13 g/dL (ref 13.0–17.1)
Lymphocytes Relative: 29 %
Lymphs Abs: 1.5 10*3/uL (ref 0.9–3.3)
MCH: 35.3 pg — AB (ref 27.2–33.4)
MCHC: 34.4 g/dL (ref 32.0–36.0)
MCV: 102.7 fL — ABNORMAL HIGH (ref 79.3–98.0)
MONO ABS: 0.6 10*3/uL (ref 0.1–0.9)
Monocytes Relative: 12 %
NEUTROS PCT: 57 %
Neutro Abs: 2.8 10*3/uL (ref 1.5–6.5)
Platelet Count: 109 10*3/uL — ABNORMAL LOW (ref 140–400)
RBC: 3.68 MIL/uL — ABNORMAL LOW (ref 4.20–5.82)
RDW: 14 % (ref 11.0–14.6)
WBC Count: 5 10*3/uL (ref 4.0–10.3)

## 2018-04-17 LAB — CMP (CANCER CENTER ONLY)
ALK PHOS: 75 U/L (ref 38–126)
ALT: 24 U/L (ref 0–44)
ANION GAP: 7 (ref 5–15)
AST: 26 U/L (ref 15–41)
Albumin: 3.8 g/dL (ref 3.5–5.0)
BILIRUBIN TOTAL: 0.4 mg/dL (ref 0.3–1.2)
BUN: 33 mg/dL — ABNORMAL HIGH (ref 8–23)
CALCIUM: 9.7 mg/dL (ref 8.9–10.3)
CO2: 23 mmol/L (ref 22–32)
Chloride: 105 mmol/L (ref 98–111)
Creatinine: 1.01 mg/dL (ref 0.61–1.24)
GFR, Estimated: >60 mL/min (ref >60)
Glucose, Bld: 88 mg/dL (ref 70–99)
Potassium: 4.5 mmol/L (ref 3.5–5.1)
Sodium: 135 mmol/L (ref 135–145)
TOTAL PROTEIN: 7.3 g/dL (ref 6.5–8.1)

## 2018-04-17 MED ORDER — PROCHLORPERAZINE MALEATE 10 MG PO TABS: 10.0000 mg | ORAL_TABLET | Freq: Once | ORAL | Status: DC

## 2018-04-17 MED ORDER — BORTEZOMIB CHEMO SQ INJECTION 3.5 MG (2.5MG/ML)
1.3000 mg/m2 | Freq: Once | INTRAMUSCULAR | Status: AC
  Administered 2018-04-17: 2.5 mg via SUBCUTANEOUS
  Filled 2018-04-17: qty 1

## 2018-04-17 NOTE — Patient Instructions (Signed)
[facility] Discharge Instructions for Patients Receiving Chemotherapy  Today you received the following chemotherapy agents Velcade.  To help prevent nausea and vomiting after your treatment, we encourage you to take your nausea medication as directed.  If you develop nausea and vomiting that is not controlled by your nausea medication, call the clinic.   BELOW ARE SYMPTOMS THAT SHOULD BE REPORTED IMMEDIATELY:  *FEVER GREATER THAN 100.5 F  *CHILLS WITH OR WITHOUT FEVER  NAUSEA AND VOMITING THAT IS NOT CONTROLLED WITH YOUR NAUSEA MEDICATION  *UNUSUAL SHORTNESS OF BREATH  *UNUSUAL BRUISING OR BLEEDING  TENDERNESS IN MOUTH AND THROAT WITH OR WITHOUT PRESENCE OF ULCERS  *URINARY PROBLEMS  *BOWEL PROBLEMS  UNUSUAL RASH Items with * indicate a potential emergency and should be followed up as soon as possible.  Feel free to call the clinic should you have any questions or concerns. The clinic phone number is [phone].  Please show the CHEMO ALERT CARD at check-in to the Emergency Department and triage nurse.   

## 2018-04-25 NOTE — Progress Notes (Signed)
Andre [name]  [phone] [phone]    ID: Andre [name]  DOB: 1950-10-29  MR#: [id number] CSN#: [id number]   Patient Care Team: Cassandria Anger, MD as PCP - General (Internal Medicine) Magrinat, [name], MD as Consulting Physician (Oncology) Concepcion Living, MD as Consulting Physician (Neurology) [name], MD as Consulting Physician (Internal Medicine) Kathrynn Ducking, MD as Consulting Physician (Neurology) Dorna Bloom, [place] as Referring Physician (Oral Surgery)   CHIEF COMPLAINT: Multiple myeloma  CURRENT TREATMENT: Maintenance therapy with bortezomib, zolendronate  INTERVAL HISTORY:  Andre [name] returns today for a follow-up and treatment of his multiple myeloma. He is doing well overall.   He continues on bortezomid every 14 days, with a dose due today, with good tolerance. He reports that a few days after his treatment, he had mild weakness, nausea, and constipation. He is still working, however, his workload has decreased which he is pleased with.   He also receives zolendronate every 12 weeks, with a dose due today. He tolerates this well.   Per patient, pomalidomide at 1.0 mg/day was discontinued prior to December 2018.    Most recent lab results are very favorable: M Protein SerPl Elph-Mcnc Not Observed g/dL Not Observed VC 0.3High  VC 0.2High  VC   Kappa, lamda light chain ratio 0.26 - 1.65 2.11High   2.02High  CM 2.29High  CM  Comment: (NOTE)      REVIEW OF SYSTEMS: Andre [name] reports that for exercise, he completes physical therapy and walks and runs on a treadmill, 23 days a week. He also does strength training with resistance bands up to 3 days a week. [name] He denies appetite or taste issues. He uses kenalog to place on his rash that notices following velcade administration. He denies unusual headaches, visual changes, nausea, vomiting, or dizziness. There has been no unusual cough, phlegm production, or  pleurisy. This been no change in bowel or bladder habits. He denies unexplained fatigue or unexplained weight loss, bleeding, rash, or fever. A detailed review of systems was otherwise stable.      HISTORY OF PRESENT ILLNESS:  From the original intake nodes:  The patient was worked up for peptic ulcer disease in August of 2011, with significant bleeding and anemia.  The patient was Helicobacter pylori negative.  He received some epinephrine when he had his EGD and then started on Protonix.  [name] anemia slowly resolved so that by September, his hemoglobin was up to 10.9 and by earlier this month, his hemoglobin was up to 12.5.    As part of his general workup, he was found to have a slightly elevated globulin fraction.  In September, his total protein was 8.3 with an albumin of 3.8.  In January, the total protein was 8.4 and albumin 3.6.  With persistence of this slight abnormality, Dr. Olevia Perches obtained serum immunofixation and SPEP.  The SPTP showed an M-spike of 2.67 grams.  A total IgG was 4,190.  Total IgA low at 28.  Total IgM low at 28 also.  The immunofixation showed a monoclonal IgG lambda paraprotein.  There were also monoclonal free lambda light chains present. With this information, the patient was referred for further evaluation.   A diagnosis of myeloma was confirmed by bone marrow biopsy and subsequebnt treatment is as detailed below  PAST MEDICAL HISTORY: Past Medical History:  Diagnosis Date  . Allergy   . Anemia   . Arthritis   . Asthma    no treatment  x 20 years  . Depression   . Double vision    occurs at times   . Duodenal ulcer   . GERD (gastroesophageal reflux disease)   . Hyperlipidemia   . Hypertension   . Hypothyroidism   . Multiple myeloma 07/04/2011  . Thyroid disease   Significant for peptic ulcer disease as noted above.  History of hyperlipidemia.  History of anxiety and depression.  History of GERD which is significantly improved with weight loss  and history of reactive airway disease, possibly secondary to the GERD which also improved with weight loss.  There was a history of obesity, now much improved secondary to exercise and diet.   PAST SURGICAL HISTORY: Past Surgical History:  Procedure Laterality Date  . BONE MARROW TRANSPLANT  2011   for MM  . CARDIOLITE STUDY  11/25/2003   NORMAL  . TONSILLECTOMY      FAMILY HISTORY Family History  Problem Relation Age of Onset  . Throat cancer Mother   . Hypertension Father   . Stroke Father   . Asthma Father   . Diabetes Father   . Pancreatic cancer Brother   [name] father died at the age of 32 following a stroke.  [name] mother died with cancer of the throat at the age of 43.   She had a history of depression and was a smoker.  Not clear how much alcohol she drank, according to the patient.  The patient had two brothers.  One died from the age of 22 from a "kidney problem."  The second one died at the age of 24 from pancreatic cancer. (This may have been duodenal cancer. 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  His wife of 37+ years was Andre [name], but they are in process of divorce, to be finalized August 2018. In 2016 he remarried, his current wife's name is Andre [name].  The patient has two daughters:  Andre [name], who works in [place], who had her second child April 2016. Both daughters live here in [place]. The patient is very close to his daughters.  [place] first daughter, Andre [name], born 03/06/2013, apparently has Weber/[place]/Rendu. The patient is not a church attender.  Andre [name] also participates in [place] even though actually there is no alcohol or drug problem in the family.  He participates in this group and he gets quite a bit of support from it.     ADVANCED DIRECTIVES: In place  HEALTH MAINTENANCE:  (updated 07/16/2013) Social History   Tobacco Use  . Smoking status: Former Smoker    Years: 3.00    Last attempt to quit: 03/27/1969    Years since quitting: 49.1  . Smokeless tobacco: Never Used  Substance Use Topics  . Alcohol use: No  . Drug use: No    Colonoscopy:  PSA:  Sept 2014, normal/Dr. [name]  Lipid panel: Sept 2014/Dr. [name]  Allergies  Allergen Reactions  . Atorvastatin Other (See Comments)  . Rosuvastatin     Other reaction(s): Liver Disorder  . Crestor [Rosuvastatin Calcium]     ADVERSE EFFECTS ON LIVER  . Lipitor [Atorvastatin Calcium]   . Septra [Sulfamethoxazole-Trimethoprim] Rash   Current Outpatient Medications  Medication Sig Dispense Refill  . aspirin EC 81 MG tablet Take 1 tablet (81 mg total) by mouth daily. 100 tablet 3  .  Trinity complex vitamins tablet Take 1 tablet by mouth daily. 100 tablet 3  . cetirizine (ZYRTEC) 10 MG tablet Take 10 mg by mouth daily.    . Cholecalciferol (VITAMIN D3) 2000 units capsule Take 1 capsule (2,000 Units total) by mouth daily. 100 capsule 3  . docusate sodium (COLACE) 100 MG capsule Take 100 mg by mouth daily as needed for mild constipation or moderate constipation.    [name] escitalopram (LEXAPRO) 10 MG tablet TAKE 1 TABLET BY MOUTH  DAILY 90 tablet 3  . feeding supplement, ENSURE ENLIVE, (ENSURE ENLIVE) LIQD Take 237 mLs by mouth 2 (two) times daily between meals. 237 mL 12  . hydrOXYzine (ATARAX/VISTARIL) 25 MG tablet Take 1 tablet (25 mg total) by mouth 3 (three) times daily as needed. 30 tablet 0  . levothyroxine (SYNTHROID, LEVOTHROID) 150 MCG tablet TAKE 1 TABLET BY MOUTH  EVERY MORNING ON AN EMPTY  STOMACH 90 tablet 3  . ondansetron (ZOFRAN) 8 MG tablet TAKE 1 TABLET BY MOUTH EVERY 8 HOURS AS NEEDED FOR NAUSEA AND VOMITING 20 tablet 0  . pantoprazole (PROTONIX) 40 MG tablet TAKE 1 TABLET BY MOUTH  TWICE A DAY BEFORE MEALS 180 tablet 3  . ranitidine (ZANTAC) 150 MG tablet Take 1 tablet (150 mg total) by mouth 2  (two) times daily. 90 tablet 3  . sildenafil (VIAGRA) 25 MG tablet Take 1 tablet (25 mg total) by mouth daily as needed for erectile dysfunction. 10 tablet 0  . triamcinolone cream (KENALOG) 0.5 % Apply 1 application topically 4 (four) times daily. 60 g 2  . valACYclovir (VALTREX) 1000 MG tablet Take 1 tablet (1,000 mg total) by mouth daily. 90 tablet 3  . zolendronic acid (ZOMETA) 4 MG/5ML injection Inject 4 mg into the vein every 3 (three) months.     No current facility-administered medications for this visit.    Objective: Middle-aged white man in no acute distress  Vitals:   05/01/18 1219  BP: 124/68  Pulse: 73  Resp: 18  Temp: 98 F (36.7 C)  SpO2: 98%  Body mass index is 26.48 kg/m. There were no vitals filed for this visit.  Sclerae unicteric, pupils round and equal Oropharynx clear and moist No cervical or supraclavicular adenopathy Lungs no rales or rhonchi Heart regular rate and rhythm Abd soft, nontender, positive bowel sounds MSK no focal spinal tenderness, no upper extremity lymphedema Neuro: nonfocal, well oriented, appropriate affect     Appointment on 05/01/2018  Component Date Value Ref Range Status  . WBC Count 05/01/2018 4.8  4.0 - 10.3 K/uL Final  . RBC 05/01/2018 3.49* 4.20 - 5.82 MIL/uL Final  . Hemoglobin 05/01/2018 12.4* 13.0 - 17.1 g/dL Final  . HCT 05/01/2018 36.7* 38.4 - 49.9 % Final  . MCV 05/01/2018 105.1* 79.3 - 98.0 fL Final  . MCH 05/01/2018 35.7* 27.2 - 33.4 pg Final  . MCHC 05/01/2018 33.9  32.0 - 36.0 g/dL Final  . RDW 05/01/2018 14.8* 11.0 - 14.6 % Final  . Platelet Count 05/01/2018 129* 140 - 400 K/uL Final  . Neutrophils Relative % 05/01/2018 63  % Final  . Neutro Abs 05/01/2018 3.0  1.5 - 6.5 K/uL Final  . Lymphocytes Relative 05/01/2018 21  % Final  . Lymphs Abs 05/01/2018 1.0  0.9 - 3.3 K/uL Final  . Monocytes Relative 05/01/2018 13  % Final  . Monocytes Absolute 05/01/2018 0.6  0.1 - 0.9 K/uL Final  . Eosinophils Relative  05/01/2018 3  % Final  . Eosinophils  Absolute 05/01/2018 0.1  0.0 - 0.5 K/uL Final  . Basophils Relative 05/01/2018 0  % Final  . Basophils Absolute 05/01/2018 0.0  0.0 - 0.1 K/uL Final   Performed at [facility], [place] [address], [place], [place] [address]     STUDIES: We again reviewed his SPEP results, the most recent of which showed no M spike.  The kappa lambda ratio is minimally off, unchanged  ASSESSMENT: 67 y.o. [place] man with a history of multiple myeloma diagnosed February of 2012, with an initial M-spike of 2.6 g/dL, IFE showing an IgG lambda paraprotein and free lambda light chains in the urine. Cytogenetics showed trisomy 64. Bone marrow biopsy showed a 22% plasmacytosis. Treated with   (1) bortezomib (subcutaneously), lenalidomide, and dexamethasone, with repeat bone marrow biopsy May of 2012 showing 10% plasmacytosis   (2) high-dose chemotherapy with BCNU and melphalan at [facility], followed by stem cell rescue July of 2012   (3) on zoledronic acid started December of 2012, initially monthly, currently given every 3 months, most recent dose  12/07/2015  (4) low-dose lenalidomide resumed April 2013, interrupted several times.  Resumed again on 02/19/2013, ata dose of 5 mg daily, 21 days on and 7 days off, later further reduced to 7 days on, 7 days off  (5) CNS symptoms and abnormal brain MRI extensively evaluated by neurology with no definitive diagnosis established  (6) rising M spike noted June 2014 but did not meet criteria for carfilzomib study  (7) on lenalidomide 25 mg daily, 14 days on, 7 days off, starting 04/18/2013, interrupted December 2014 because of rash;   (a) resumed January 2015 at 10 mg/ day at 21 days on/ 7 days off  ([place]) starting 08/18/2014 decreased to 10 mg/ day 14 days on, 7 days off because of cytopenias  (c) as of February 2016 was on 5 mg daily 7 days on 7 days off  (d) lenalidomide discontinued December 2016 with evidence of  disease progression  (8) transient global amnesia 05/29/2015, resolved without intervention  (9) starting PVD 10/25/2015 w ASA 325 thromboprophylaxis, valacyclovir prophylaxis, last dose 12/17/2015  (a) pomalidomide 4 mg/d days 1-14  ([place]) bortezomib sQ days 2,5,9,12 of each 21 day cycle  (c) dexamethasome 20 mg two days a week  (d) dexamethasone bortezomib and pomalidomide discontinued late December 2018 with poor tolerance  (10) metapneumovirus pneumonia April 2017  (a) completing course of steroids and week of bactrim mid April 2017  (11) status post second autologous transplant at [facility] 02/04/2016(preparatory regimen melphalan 200 mg/m)  (a) received twelve-month vaccinations 03/14/2017 (DPT, Haemophilus, Pneumovax 13, polio)  ([place]) 14 months injections 05/04/2016 include DTaP, Hib conjugate, HepB energex Abhiram 20 mcg/ml, Prevnar 13  (c) 24 month vaccines due at [facility] June 2019  (12) maintenance therapy started November 2017, consisting of  (a) bortezomib 1.3 mg/M2 every 14 days, first dose 07/27/2016  ([place]) pomalidomide 1 mg days 1-21 Q28 days, started 07/19/2016  (c) zolendronate monthly started 07/27/2016 (previously Q 3 months) however patient unable to tolerate, and changed back to q3 month in April, 2018   (d) Bortezomib changed to monthly as of June 2018 because of tolerance issues, however discontinued after 09/14/2017 dose because of a rise in his M spike  (e) pomalidomide held after 10/18/2017 in preparation for possible study at [place] [facility] (venetoclax)--never resumed  (f) with numbers actually improved off treatment, resumed every 2-week bortezomib 12/25/2017  PLAN:  Andre [name] is now more than 4 years out from his second  autologous transplant at [facility], with very well controlled multiple myeloma.  This is very favorable.  He is tolerating the Velcade generally well.  He only receives it every 14 days.  As noted above, he is most recent M spike was not measurable.  I am making no changes in his  treatment.  He will continue to receive bortezomib every 14 days and zolendronate every 12 weeks, with his next zolendronate dose being 06/12/2018.  He will see me in approximately 3 months.  He knows to call for any other issues that may develop before the next visit.    Magrinat, [name], MD  05/01/18 12:46 PM Medical Oncology and Hematology [facility] [address] [place], [place] [address] Tel. [phone]    Fax. [phone]    I, Soijett Blue am acting as scribe for Dr. Sarajane Jews C. Magrinat.  I, Lurline Del MD, have reviewed the above documentation for accuracy and completeness, and I agree with the above.

## 2018-04-30 ENCOUNTER — Ambulatory Visit: Payer: Commercial Managed Care - PPO | Admitting: Oncology

## 2018-04-30 ENCOUNTER — Other Ambulatory Visit: Payer: Commercial Managed Care - PPO

## 2018-05-01 ENCOUNTER — Inpatient Hospital Stay: Payer: 59

## 2018-05-01 ENCOUNTER — Inpatient Hospital Stay: Payer: 59 | Attending: Oncology

## 2018-05-01 ENCOUNTER — Inpatient Hospital Stay (HOSPITAL_BASED_OUTPATIENT_CLINIC_OR_DEPARTMENT_OTHER): Payer: 59 | Admitting: Oncology

## 2018-05-01 VITALS — BP 124/68 | HR 73 | Temp 98.0°F | Resp 18 | Ht 69.0 in

## 2018-05-01 VITALS — BP 112/76 | HR 68 | Temp 98.1°F | Resp 16

## 2018-05-01 DIAGNOSIS — Z87891 Personal history of nicotine dependence: Secondary | ICD-10-CM | POA: Diagnosis not present

## 2018-05-01 DIAGNOSIS — F329 Major depressive disorder, single episode, unspecified: Secondary | ICD-10-CM | POA: Diagnosis not present

## 2018-05-01 DIAGNOSIS — C9000 Multiple myeloma not having achieved remission: Secondary | ICD-10-CM

## 2018-05-01 DIAGNOSIS — Z5112 Encounter for antineoplastic immunotherapy: Secondary | ICD-10-CM | POA: Diagnosis not present

## 2018-05-01 DIAGNOSIS — I10 Essential (primary) hypertension: Secondary | ICD-10-CM | POA: Diagnosis not present

## 2018-05-01 DIAGNOSIS — E039 Hypothyroidism, unspecified: Secondary | ICD-10-CM | POA: Insufficient documentation

## 2018-05-01 DIAGNOSIS — D649 Anemia, unspecified: Secondary | ICD-10-CM | POA: Insufficient documentation

## 2018-05-01 DIAGNOSIS — T451X5A Adverse effect of antineoplastic and immunosuppressive drugs, initial encounter: Secondary | ICD-10-CM

## 2018-05-01 DIAGNOSIS — F419 Anxiety disorder, unspecified: Secondary | ICD-10-CM

## 2018-05-01 DIAGNOSIS — Z79899 Other long term (current) drug therapy: Secondary | ICD-10-CM | POA: Diagnosis not present

## 2018-05-01 DIAGNOSIS — E669 Obesity, unspecified: Secondary | ICD-10-CM

## 2018-05-01 DIAGNOSIS — Z9481 Bone marrow transplant status: Secondary | ICD-10-CM

## 2018-05-01 DIAGNOSIS — R9089 Other abnormal findings on diagnostic imaging of central nervous system: Secondary | ICD-10-CM

## 2018-05-01 DIAGNOSIS — D701 Agranulocytosis secondary to cancer chemotherapy: Secondary | ICD-10-CM

## 2018-05-01 DIAGNOSIS — Z9484 Stem cells transplant status: Secondary | ICD-10-CM

## 2018-05-01 LAB — CMP (CANCER CENTER ONLY)
ALBUMIN: 3.6 g/dL (ref 3.5–5.0)
ALT: 25 U/L (ref 0–44)
ANION GAP: 11 (ref 5–15)
AST: 26 U/L (ref 15–41)
Alkaline Phosphatase: 80 U/L (ref 38–126)
BUN: 31 mg/dL — AB (ref 8–23)
CHLORIDE: 105 mmol/L (ref 98–111)
CO2: 21 mmol/L — ABNORMAL LOW (ref 22–32)
Calcium: 9.3 mg/dL (ref 8.9–10.3)
Creatinine: 0.94 mg/dL (ref 0.61–1.24)
GFR, Est AFR Am: >60 mL/min (ref >60)
GLUCOSE: 95 mg/dL (ref 70–99)
POTASSIUM: 4.4 mmol/L (ref 3.5–5.1)
Sodium: 137 mmol/L (ref 135–145)
Total Bilirubin: 0.4 mg/dL (ref 0.3–1.2)
Total Protein: 7.1 g/dL (ref 6.5–8.1)

## 2018-05-01 LAB — CBC WITH DIFFERENTIAL (CANCER CENTER ONLY)
Basophils Absolute: 0 10*3/uL (ref 0.0–0.1)
Basophils Relative: 0 %
EOS PCT: 3 %
Eosinophils Absolute: 0.1 10*3/uL (ref 0.0–0.5)
HCT: 36.7 % — ABNORMAL LOW (ref 38.4–49.9)
HEMOGLOBIN: 12.4 g/dL — AB (ref 13.0–17.1)
LYMPHS ABS: 1 10*3/uL (ref 0.9–3.3)
LYMPHS PCT: 21 %
MCH: 35.7 pg — AB (ref 27.2–33.4)
MCHC: 33.9 g/dL (ref 32.0–36.0)
MCV: 105.1 fL — AB (ref 79.3–98.0)
Monocytes Absolute: 0.6 10*3/uL (ref 0.1–0.9)
Monocytes Relative: 13 %
Neutro Abs: 3 10*3/uL (ref 1.5–6.5)
Neutrophils Relative %: 63 %
PLATELETS: 129 10*3/uL — AB (ref 140–400)
RBC: 3.49 MIL/uL — AB (ref 4.20–5.82)
RDW: 14.8 % — ABNORMAL HIGH (ref 11.0–14.6)
WBC: 4.8 10*3/uL (ref 4.0–10.3)

## 2018-05-01 MED ORDER — RANITIDINE HCL 150 MG PO TABS: 150.0000 mg | ORAL_TABLET | Freq: Two times a day (BID) | ORAL | 3 refills | Status: DC

## 2018-05-01 MED ORDER — BORTEZOMIB CHEMO SQ INJECTION 3.5 MG (2.5MG/ML)
1.3000 mg/m2 | Freq: Once | INTRAMUSCULAR | Status: AC
  Administered 2018-05-01: 2.5 mg via SUBCUTANEOUS
  Filled 2018-05-01: qty 1

## 2018-05-01 MED ORDER — TRIAMCINOLONE ACETONIDE 0.5 % EX CREA: 1.0000 "application " | TOPICAL_CREAM | Freq: Four times a day (QID) | CUTANEOUS | 2 refills | Status: AC

## 2018-05-01 MED ORDER — PROCHLORPERAZINE MALEATE 10 MG PO TABS: 10.0000 mg | ORAL_TABLET | Freq: Once | ORAL | Status: DC

## 2018-05-01 NOTE — Patient Instructions (Signed)
[facility] Discharge Instructions for Patients Receiving Chemotherapy  Today you received the following chemotherapy agents Velcade To help prevent nausea and vomiting after your treatment, we encourage you to take your nausea medication as prescribed.   If you develop nausea and vomiting that is not controlled by your nausea medication, call the clinic.   BELOW ARE SYMPTOMS THAT SHOULD BE REPORTED IMMEDIATELY:  *FEVER GREATER THAN 100.5 F  *CHILLS WITH OR WITHOUT FEVER  NAUSEA AND VOMITING THAT IS NOT CONTROLLED WITH YOUR NAUSEA MEDICATION  *UNUSUAL SHORTNESS OF BREATH  *UNUSUAL BRUISING OR BLEEDING  TENDERNESS IN MOUTH AND THROAT WITH OR WITHOUT PRESENCE OF ULCERS  *URINARY PROBLEMS  *BOWEL PROBLEMS  UNUSUAL RASH Items with * indicate a potential emergency and should be followed up as soon as possible.  Feel free to call the clinic should you have any questions or concerns. The clinic phone number is [phone].  Please show the CHEMO ALERT CARD at check-in to the Emergency Department and triage nurse.   

## 2018-05-02 ENCOUNTER — Other Ambulatory Visit: Payer: Self-pay | Admitting: Oncology

## 2018-05-02 LAB — KAPPA/LAMBDA LIGHT CHAINS
Kappa free light chain: 30.2 mg/L — ABNORMAL HIGH (ref 3.3–19.4)
Kappa, lambda light chain ratio: 2.34 — ABNORMAL HIGH (ref 0.26–1.65)
Lambda free light chains: 12.9 mg/L (ref 5.7–26.3)

## 2018-05-03 LAB — MULTIPLE MYELOMA PANEL, SERUM
ALPHA2 GLOB SERPL ELPH-MCNC: 0.7 g/dL (ref 0.4–1.0)
Albumin SerPl Elph-Mcnc: 3.5 g/dL (ref 2.9–4.4)
Albumin/Glob SerPl: 1.2 (ref 0.7–1.7)
Alpha 1: 0.2 g/dL (ref 0.0–0.4)
B-GLOBULIN SERPL ELPH-MCNC: 1 g/dL (ref 0.7–1.3)
GLOBULIN, TOTAL: 3.1 g/dL (ref 2.2–3.9)
Gamma Glob SerPl Elph-Mcnc: 1.2 g/dL (ref 0.4–1.8)
IgA: 113 mg/dL (ref 61–437)
IgG (Immunoglobin G), Serum: 1270 mg/dL (ref 700–1600)
IgM (Immunoglobulin M), Srm: 62 mg/dL (ref 20–172)
TOTAL PROTEIN ELP: 6.6 g/dL (ref 6.0–8.5)

## 2018-05-07 ENCOUNTER — Other Ambulatory Visit: Payer: Self-pay | Admitting: Oncology

## 2018-05-14 ENCOUNTER — Other Ambulatory Visit: Payer: Self-pay

## 2018-05-14 NOTE — Progress Notes (Signed)
Error.  No entry.

## 2018-05-15 ENCOUNTER — Other Ambulatory Visit: Payer: Self-pay

## 2018-05-15 ENCOUNTER — Inpatient Hospital Stay: Payer: 59

## 2018-05-15 VITALS — BP 136/63 | HR 57 | Temp 97.7°F | Resp 16 | Wt 179.8 lb

## 2018-05-15 DIAGNOSIS — C9000 Multiple myeloma not having achieved remission: Secondary | ICD-10-CM

## 2018-05-15 DIAGNOSIS — Z5112 Encounter for antineoplastic immunotherapy: Secondary | ICD-10-CM | POA: Diagnosis not present

## 2018-05-15 DIAGNOSIS — Z9481 Bone marrow transplant status: Secondary | ICD-10-CM

## 2018-05-15 LAB — CBC WITH DIFFERENTIAL (CANCER CENTER ONLY)
BASOS PCT: 1 %
Basophils Absolute: 0 10*3/uL (ref 0.0–0.1)
EOS ABS: 0.1 10*3/uL (ref 0.0–0.5)
Eosinophils Relative: 3 %
HEMATOCRIT: 41.9 % (ref 38.4–49.9)
Hemoglobin: 13.8 g/dL (ref 13.0–17.1)
LYMPHS ABS: 1 10*3/uL (ref 0.9–3.3)
Lymphocytes Relative: 21 %
MCH: 35.2 pg — ABNORMAL HIGH (ref 27.2–33.4)
MCHC: 32.9 g/dL (ref 32.0–36.0)
MCV: 107 fL — ABNORMAL HIGH (ref 79.3–98.0)
MONO ABS: 0.8 10*3/uL (ref 0.1–0.9)
MONOS PCT: 16 %
Neutro Abs: 2.9 10*3/uL (ref 1.5–6.5)
Neutrophils Relative %: 59 %
Platelet Count: 120 10*3/uL — ABNORMAL LOW (ref 140–400)
RBC: 3.92 MIL/uL — ABNORMAL LOW (ref 4.20–5.82)
RDW: 14.8 % — AB (ref 11.0–14.6)
WBC Count: 4.8 10*3/uL (ref 4.0–10.3)

## 2018-05-15 LAB — CMP (CANCER CENTER ONLY)
ALBUMIN: 3.8 g/dL (ref 3.5–5.0)
ALK PHOS: 91 U/L (ref 38–126)
ALT: 20 U/L (ref 0–44)
AST: 28 U/L (ref 15–41)
Anion gap: 7 (ref 5–15)
BUN: 28 mg/dL — ABNORMAL HIGH (ref 8–23)
CALCIUM: 9.9 mg/dL (ref 8.9–10.3)
CO2: 25 mmol/L (ref 22–32)
Chloride: 105 mmol/L (ref 98–111)
Creatinine: 1.04 mg/dL (ref 0.61–1.24)
GFR, Est AFR Am: >60 mL/min (ref >60)
GFR, Estimated: >60 mL/min (ref >60)
GLUCOSE: 90 mg/dL (ref 70–99)
Potassium: 4.5 mmol/L (ref 3.5–5.1)
SODIUM: 137 mmol/L (ref 135–145)
Total Bilirubin: 0.4 mg/dL (ref 0.3–1.2)
Total Protein: 7.5 g/dL (ref 6.5–8.1)

## 2018-05-15 MED ORDER — PROCHLORPERAZINE MALEATE 10 MG PO TABS: 10.0000 mg | ORAL_TABLET | Freq: Once | ORAL | Status: DC

## 2018-05-15 MED ORDER — BORTEZOMIB CHEMO SQ INJECTION 3.5 MG (2.5MG/ML)
1.3000 mg/m2 | Freq: Once | INTRAMUSCULAR | Status: AC
  Administered 2018-05-15: 2.5 mg via SUBCUTANEOUS
  Filled 2018-05-15: qty 1

## 2018-05-15 NOTE — Patient Instructions (Signed)
[facility] Discharge Instructions for Patients Receiving Chemotherapy  Today you received the following chemotherapy agents Velcade.  To help prevent nausea and vomiting after your treatment, we encourage you to take your nausea medication as directed.  If you develop nausea and vomiting that is not controlled by your nausea medication, call the clinic.   BELOW ARE SYMPTOMS THAT SHOULD BE REPORTED IMMEDIATELY:  *FEVER GREATER THAN 100.5 F  *CHILLS WITH OR WITHOUT FEVER  NAUSEA AND VOMITING THAT IS NOT CONTROLLED WITH YOUR NAUSEA MEDICATION  *UNUSUAL SHORTNESS OF BREATH  *UNUSUAL BRUISING OR BLEEDING  TENDERNESS IN MOUTH AND THROAT WITH OR WITHOUT PRESENCE OF ULCERS  *URINARY PROBLEMS  *BOWEL PROBLEMS  UNUSUAL RASH Items with * indicate a potential emergency and should be followed up as soon as possible.  Feel free to call the clinic should you have any questions or concerns. The clinic phone number is [phone].  Please show the CHEMO ALERT CARD at check-in to the Emergency Department and triage nurse.   

## 2018-05-16 LAB — KAPPA/LAMBDA LIGHT CHAINS
Kappa free light chain: 30.6 mg/L — ABNORMAL HIGH (ref 3.3–19.4)
Kappa, lambda light chain ratio: 2.17 — ABNORMAL HIGH (ref 0.26–1.65)
Lambda free light chains: 14.1 mg/L (ref 5.7–26.3)

## 2018-05-20 ENCOUNTER — Encounter: Payer: Self-pay | Admitting: Oncology

## 2018-05-23 ENCOUNTER — Telehealth: Payer: Self-pay | Admitting: Medical Oncology

## 2018-05-23 ENCOUNTER — Inpatient Hospital Stay: Payer: 59

## 2018-05-23 ENCOUNTER — Other Ambulatory Visit: Payer: Self-pay | Admitting: Oncology

## 2018-05-23 ENCOUNTER — Inpatient Hospital Stay: Payer: 59 | Attending: Oncology | Admitting: Medical

## 2018-05-23 ENCOUNTER — Telehealth: Payer: Self-pay | Admitting: *Deleted

## 2018-05-23 ENCOUNTER — Ambulatory Visit (HOSPITAL_COMMUNITY)
Admission: RE | Admit: 2018-05-23 | Discharge: 2018-05-23 | Disposition: A | Discharge status: Home / Self Care | Payer: 59 | Source: Ambulatory Visit | Attending: Oncology | Admitting: Oncology

## 2018-05-23 ENCOUNTER — Ambulatory Visit (HOSPITAL_COMMUNITY)
Admission: RE | Admit: 2018-05-23 | Discharge: 2018-05-23 | Disposition: A | Discharge status: Home / Self Care | Payer: 59 | Source: Ambulatory Visit | Attending: Medical | Admitting: Medical

## 2018-05-23 VITALS — BP 125/74 | HR 77 | Temp 97.6°F | Resp 18 | Ht 69.0 in | Wt 182.9 lb

## 2018-05-23 DIAGNOSIS — G44319 Acute post-traumatic headache, not intractable: Secondary | ICD-10-CM | POA: Insufficient documentation

## 2018-05-23 DIAGNOSIS — Y9302 Activity, running: Secondary | ICD-10-CM | POA: Insufficient documentation

## 2018-05-23 DIAGNOSIS — R0781 Pleurodynia: Secondary | ICD-10-CM | POA: Insufficient documentation

## 2018-05-23 DIAGNOSIS — C9000 Multiple myeloma not having achieved remission: Secondary | ICD-10-CM

## 2018-05-23 DIAGNOSIS — X58XXXA Exposure to other specified factors, initial encounter: Secondary | ICD-10-CM | POA: Insufficient documentation

## 2018-05-23 DIAGNOSIS — S2241XA Multiple fractures of ribs, right side, initial encounter for closed fracture: Secondary | ICD-10-CM | POA: Insufficient documentation

## 2018-05-23 DIAGNOSIS — S2231XA Fracture of one rib, right side, initial encounter for closed fracture: Secondary | ICD-10-CM | POA: Insufficient documentation

## 2018-05-23 DIAGNOSIS — G319 Degenerative disease of nervous system, unspecified: Secondary | ICD-10-CM | POA: Diagnosis not present

## 2018-05-23 DIAGNOSIS — W19XXXA Unspecified fall, initial encounter: Principal | ICD-10-CM

## 2018-05-23 DIAGNOSIS — R937 Abnormal findings on diagnostic imaging of other parts of musculoskeletal system: Secondary | ICD-10-CM | POA: Insufficient documentation

## 2018-05-23 DIAGNOSIS — R51 Headache: Secondary | ICD-10-CM | POA: Diagnosis not present

## 2018-05-23 DIAGNOSIS — I739 Peripheral vascular disease, unspecified: Secondary | ICD-10-CM | POA: Diagnosis not present

## 2018-05-23 DIAGNOSIS — S0990XA Unspecified injury of head, initial encounter: Secondary | ICD-10-CM | POA: Diagnosis not present

## 2018-05-23 DIAGNOSIS — Z5112 Encounter for antineoplastic immunotherapy: Secondary | ICD-10-CM | POA: Insufficient documentation

## 2018-05-23 MED ORDER — HYDROCODONE-ACETAMINOPHEN 5-325 MG PO TABS: ORAL_TABLET | ORAL | 0 refills | Status: DC

## 2018-05-23 NOTE — Telephone Encounter (Signed)
This RN spoke with pt per his call stating he has fallen x 2 while jogging today. He now has significant rib pain.  Pt is under active chemo for multiple myeloma.  Per above discussion - pt is to come in for CXR and then be seen in [facility] for assessment and recommendation.  Andre [name] verbalized understanding.  Order entered for xray and urgent appointment request sent to scheduling.

## 2018-05-23 NOTE — Progress Notes (Signed)
These preliminary result these preliminary results were noted.  Awaiting final report.

## 2018-05-23 NOTE — Patient Instructions (Signed)

## 2018-05-23 NOTE — Telephone Encounter (Signed)
See other note

## 2018-05-23 NOTE — Telephone Encounter (Signed)
Fell twice today while running on pavement , right Rib pain now, call transferred to Val D.

## 2018-05-24 NOTE — Progress Notes (Signed)
[facility] Progress Note   Andre [name] [id number] December 18, 1950 67 y.o.  Lott Debbie Bellucci is managed by Dr. [name]  Actively treated with chemotherapy/immunotherapy: yes  Current Therapy: Velcade  Last Treated:   05/15/2018 (cycle 11, day 1)  Assessment: Plan:    Closed fracture of multiple ribs of right side, initial encounter - Plan: XR Ribs Unilateral W/Chest Right, HYDROcodone-acetaminophen (NORCO) 5-325 MG tablet  Acute post-traumatic headache, not intractable - Plan: CT Head Wo Contrast  Multiple myeloma not having achieved remission ([place])   Slight displaced fracture of the posterior right fifth rib and probable nondisplaced fracture of the posterior right fourth rib: Mr. Broughton was referred for a x-ray of his ribs which showed results as noted.  He was given a prescription for Norco for pain control.  He was encouraged to take multiple deep breaths throughout the day to reduce his risk of pneumonia.  He will follow-up with Dr. [name] on 05/29/2018 with a chest x-ray to be completed prior to his appointment.  A chest x-ray has been ordered.  Acute posttraumatic headache following blunt force trauma to the right frontal skull: The patient was referred for a noncontrast head CT which returned showing multiple lytic lesions consistent with the patient's history of multiple myeloma but with no findings suggestive of a subdural hematoma or other evidence of trauma to the brain.  Multiple myeloma: The patient continues to be followed by Dr. [name] and is status post cycle 11, day 1 of Velcade which was dosed on 05/15/2018.  He will follow-up with Dr. [name] on 05/29/2018.  Please see After Visit Summary for patient specific instructions.  Future Appointments  Date Time Provider [place]  05/29/2018  9:00 AM [facility] 3 [facility] None  05/29/2018 10:00 AM [facility] INFUSION [facility] None  06/12/2018  9:00 AM [facility] ONLY [facility]  None  06/12/2018 10:00 AM [facility] INFUSION [facility] None  06/26/2018  9:00 AM [facility] LAB 6 [facility] None  06/26/2018 10:00 AM [facility] INFUSION [facility] None  07/10/2018  9:00 AM [facility] LAB 2 [facility] None  07/10/2018 10:00 AM [facility] INFUSION [facility] None  07/24/2018 10:30 AM [facility] LAB 3 [facility] None  07/24/2018 11:00 AM Magrinat, [name], MD [facility] None  07/24/2018  1:00 PM [facility] INFUSION [facility] None  08/07/2018  9:00 AM [facility] LAB 4 [facility] None  08/07/2018 10:00 AM [facility] INFUSION [facility] None  08/19/2018  2:00 PM [name], Evie Lacks, MD [id number]  08/21/2018  9:00 AM [facility] LAB 2 [facility] None  08/21/2018 10:00 AM [facility] INFUSION [facility] None    Orders Placed This Encounter  Procedures  . CT Head Wo Contrast  . XR Ribs Unilateral W/Chest Right       Subjective:   Patient ID:  Marsha Atley Neubert is a 67 y.o. (DOB 05/03/1951) male.  Chief Complaint:  Chief Complaint  Patient presents with  . Chest Pain    HPI Gustave Seville Downs is a 67 year old male with a history of multiple myeloma who is managed by Dr. [name] and is currently treated with Velcade.  He had been jogging earlier today when he fell.  He hit his right frontal skull but does not recall doing so.  He is unsure if he had a loss of consciousness after his fall.  He has been having a headache but does not report that his headache is any more severe than it has been recently.  He would like to proceed with a head CT however.  He  also fell onto his knees causing a bruise at that location.  He is having right rib pain with a chest x-ray returning showing a slightly displaced fracture of the posterior right fifth rib and a probable nondisplaced fracture of the posterior right fourth rib.  Medications: I have reviewed the patient's current medications.  Allergies:  Allergies  Allergen Reactions  . Atorvastatin Other (See  Comments)  . Rosuvastatin     Other reaction(s): Liver Disorder  . Crestor [Rosuvastatin Calcium]     ADVERSE EFFECTS ON LIVER  . Lipitor [Atorvastatin Calcium]   . Septra [Sulfamethoxazole-Trimethoprim] Rash    Past Medical History:  Diagnosis Date  . Allergy   . Anemia   . Arthritis   . Asthma    no treatment x 20 years  . Depression   . Double vision    occurs at times   . Duodenal ulcer   . GERD (gastroesophageal reflux disease)   . Hyperlipidemia   . Hypertension   . Hypothyroidism   . Multiple myeloma 07/04/2011  . Thyroid disease     Past Surgical History:  Procedure Laterality Date  . BONE MARROW TRANSPLANT  2011   for MM  . CARDIOLITE STUDY  11/25/2003   NORMAL  . TONSILLECTOMY      Family History  Problem Relation Age of Onset  . Throat cancer Mother   . Hypertension Father   . Stroke Father   . Asthma Father   . Diabetes Father   . Pancreatic cancer Brother     Social History   Socioeconomic History  . Marital status: Married    Spouse name: Not on file  . Number of children: Not on file  . Years of education: Not on file  . Highest education level: Not on file  Occupational History  . Not on file  Social Needs  . Financial resource strain: Not on file  . Food insecurity:    Worry: Not on file    Inability: Not on file  . Transportation needs:    Medical: Not on file    Non-medical: Not on file  Tobacco Use  . Smoking status: Former Smoker    Years: 3.00    Last attempt to quit: 03/27/1969    Years since quitting: 49.1  . Smokeless tobacco: Never Used  Substance and Sexual Activity  . Alcohol use: No  . Drug use: No  . Sexual activity: Not Currently  Lifestyle  . Physical activity:    Days per week: Not on file    Minutes per session: Not on file  . Stress: Not on file  Relationships  . Social connections:    Talks on phone: Not on file    Gets together: Not on file    Attends religious service: Not on file    Active member  of club or organization: Not on file    Attends meetings of clubs or organizations: Not on file    Relationship status: Not on file  . Intimate partner violence:    Fear of current or ex partner: Not on file    Emotionally abused: Not on file    Physically abused: Not on file    Forced sexual activity: Not on file  Other Topics Concern  . Not on file  Social History Narrative  . Not on file    Past Medical History, Surgical history, Social history, and Family history were reviewed and updated as appropriate.   Please see review of systems  for further details on the patient's review from today.   Review of Systems:  Review of Systems  Constitutional: Negative for activity change.  Eyes: Negative for visual disturbance.  Respiratory: Negative for cough, chest tightness and shortness of breath.   Cardiovascular: Positive for chest pain. Negative for palpitations and leg swelling.  Skin: Positive for wound.       Bruising over the right frontal skull and bilateral knees.  Neurological: Positive for headaches. Negative for dizziness, syncope, facial asymmetry, speech difficulty, weakness, light-headedness and numbness.  Psychiatric/Behavioral: Negative for agitation, confusion and decreased concentration.    Objective:   Physical Exam:  BP 125/74 (BP Location: Left Arm, Patient Position: Sitting)   Pulse 77   Temp 97.6 F (36.4 C) (Oral)   Resp 18   Ht 5' 9"  (1.753 m)   Wt 182 lb 14.4 oz (83 kg)   SpO2 97%   BMI 27.01 kg/m  ECOG: 0  Physical Exam  Constitutional: He appears well-developed and well-nourished. No distress.  HENT:  Head: Normocephalic and atraumatic.  Eyes: Pupils are equal, round, and reactive to light. EOM are normal. Right conjunctiva is not injected. Right conjunctiva has no hemorrhage. Left conjunctiva is not injected. Left conjunctiva has no hemorrhage. Right eye exhibits normal extraocular motion and no nystagmus. Left eye exhibits normal extraocular  motion and no nystagmus.  Fundoscopic exam:      The right eye shows no AV nicking, no exudate, no hemorrhage and no papilledema.       The left eye shows no AV nicking, no exudate, no hemorrhage and no papilledema.  Cardiovascular: Normal rate, regular rhythm and normal heart sounds. Exam reveals no gallop and no friction rub.  No murmur heard. Pulmonary/Chest: Effort normal and breath sounds normal. No respiratory distress. He has no decreased breath sounds. He has no wheezes. He has no rales.  Abdominal: Soft.  Musculoskeletal:       Right lower leg: He exhibits tenderness (Tenderness over the right lateral mid ribs.).  Neurological: He is alert.  Skin: Skin is warm and dry. No rash noted. He is not diaphoretic. No erythema.  Bruising over the right frontal skull, bilateral knees, and right mid abdomen.  The right mid abdomen bruising represents where the patient has received a Velcade shot.  Psychiatric: He has a normal mood and affect. His behavior is normal. His mood appears not anxious. He is not agitated.    Lab Review:     Component Value Date/Time   NA 137 05/15/2018 0924   NA 136 09/14/2017 1008   K 4.5 05/15/2018 0924   K 4.3 09/14/2017 1008   CL 105 05/15/2018 0924   CL 107 03/10/2013 1352   CO2 25 05/15/2018 0924   CO2 23 09/14/2017 1008   GLUCOSE 90 05/15/2018 0924   GLUCOSE 73 09/14/2017 1008   GLUCOSE 99 03/10/2013 1352   BUN 28 (H) 05/15/2018 0924   BUN 19.4 09/14/2017 1008   CREATININE 1.04 05/15/2018 0924   CREATININE 1.1 09/14/2017 1008   CALCIUM 9.9 05/15/2018 0924   CALCIUM 10.1 09/14/2017 1008   PROT 7.5 05/15/2018 0924   PROT 8.0 09/14/2017 1008   PROT 7.4 09/14/2017 1008   ALBUMIN 3.8 05/15/2018 0924   ALBUMIN 3.7 09/14/2017 1008   AST 28 05/15/2018 0924   AST 26 09/14/2017 1008   ALT 20 05/15/2018 0924   ALT 23 09/14/2017 1008   ALKPHOS 91 05/15/2018 0924   ALKPHOS 80 09/14/2017 1008   BILITOT  0.4 05/15/2018 0924   BILITOT 0.42 09/14/2017  1008   GFRNONAA >60 05/15/2018 0924   GFRAA >60 05/15/2018 0924       Component Value Date/Time   WBC 4.8 05/15/2018 0924   WBC 4.2 02/20/2018 0806   RBC 3.92 (L) 05/15/2018 0924   HGB 13.8 05/15/2018 0924   HGB 13.9 09/14/2017 1008   HCT 41.9 05/15/2018 0924   HCT 40.6 09/14/2017 1008   PLT 120 (L) 05/15/2018 0924   PLT 170 09/14/2017 1008   MCV 107.0 (H) 05/15/2018 0924   MCV 107.5 (H) 09/14/2017 1008   MCH 35.2 (H) 05/15/2018 0924   MCHC 32.9 05/15/2018 0924   RDW 14.8 (H) 05/15/2018 0924   RDW 14.9 (H) 09/14/2017 1008   LYMPHSABS 1.0 05/15/2018 0924   LYMPHSABS 1.2 09/14/2017 1008   MONOABS 0.8 05/15/2018 0924   MONOABS 0.8 09/14/2017 1008   EOSABS 0.1 05/15/2018 0924   EOSABS 0.2 09/14/2017 1008   BASOSABS 0.0 05/15/2018 0924   BASOSABS 0.1 09/14/2017 1008   -------------------------------  Imaging from last 24 hours (if applicable):  Radiology interpretation: Dg Chest 2 View  Result Date: 05/23/2018 CLINICAL DATA:  Rib pain post fall. Known multiple myeloma. Evaluate for fracture. EXAM: CHEST - 2 VIEW COMPARISON:  Chest x-ray dated 11/20/2016. FINDINGS: Heart size and mediastinal contours are within normal limits. Lungs are clear. No pleural effusion or pneumothorax seen. Slightly displaced fracture of the posterior RIGHT fifth rib. Probable nondisplaced fracture of the posterior RIGHT fourth rib. IMPRESSION: 1. Slightly displaced fracture of the posterior RIGHT fifth rib. 2. Probable nondisplaced fracture of the posterior RIGHT fourth rib. 3. Lungs are clear.  No pleural effusion or pneumothorax seen. Electronically Signed   By: Franki Cabot M.D.   On: 05/23/2018 12:14   Ct Head Wo Contrast  Result Date: 05/23/2018 CLINICAL DATA:  Pain following fall EXAM: CT HEAD WITHOUT CONTRAST TECHNIQUE: Contiguous axial images were obtained from the base of the skull through the vertex without intravenous contrast. COMPARISON:  May 29, 2015 FINDINGS: Brain: There is mild  diffuse atrophy. There is no intracranial mass, hemorrhage, extra-axial fluid collection, or midline shift. There is evidence of a prior infarct in the posterior mid left cerebellum, stable. A more subtle prior infarct in the left lentiform nucleus is stable. There is relatively mild periventricular small vessel disease in the centra semiovale bilaterally. No acute appearing infarct evident. Vascular: No hyperdense vessel. There is calcification in each carotid siphon region. Skull: There are lytic lesions throughout the calvarium consistent with multiple myeloma, a finding that has been noted previously. Sinuses/Orbits: There is opacification in several ethmoid air cells. Other visualized paranasal sinuses are clear. Orbits appear symmetric bilaterally. Other: Mastoid air cells are clear. IMPRESSION: 1. Widespread lytic calvarial lesions consistent with known multiple myeloma, stable. 2. Atrophy with periventricular small vessel disease. Prior small infarcts, most notably in the posterior mid left cerebellum, stable. No acute infarct. No mass or hemorrhage. 3.  There are foci of arterial vascular calcification. Electronically Signed   By: Lowella Grip III M.D.   On: 05/23/2018 15:51        This case was discussed with Dr. [name]. He expressed his agreement with my management of this patient.

## 2018-05-27 ENCOUNTER — Encounter: Payer: Self-pay | Admitting: Medical

## 2018-05-27 ENCOUNTER — Other Ambulatory Visit: Payer: Self-pay | Admitting: Oncology

## 2018-05-27 DIAGNOSIS — S2241XA Multiple fractures of ribs, right side, initial encounter for closed fracture: Secondary | ICD-10-CM

## 2018-05-27 MED ORDER — HYDROCODONE-ACETAMINOPHEN 5-325 MG PO TABS: ORAL_TABLET | ORAL | 0 refills | Status: DC

## 2018-05-27 NOTE — Telephone Encounter (Signed)
[name] was seen in [facility] last Thursday and has 2 broken ribs.  He only has 1 more hydrocodone pill left and [name] prescribed it.  Lucianne Lei is out of the office this week and next week.  Could Dr. [name] refill this medication? Thanks! Threasa Beards

## 2018-05-29 ENCOUNTER — Inpatient Hospital Stay: Payer: 59

## 2018-05-29 VITALS — BP 132/68 | HR 53 | Temp 97.7°F | Resp 18

## 2018-05-29 DIAGNOSIS — Z9481 Bone marrow transplant status: Secondary | ICD-10-CM

## 2018-05-29 DIAGNOSIS — C9000 Multiple myeloma not having achieved remission: Secondary | ICD-10-CM

## 2018-05-29 DIAGNOSIS — Z5112 Encounter for antineoplastic immunotherapy: Secondary | ICD-10-CM | POA: Diagnosis not present

## 2018-05-29 LAB — CMP (CANCER CENTER ONLY)
ALT: 19 U/L (ref 0–44)
ANION GAP: 11 (ref 5–15)
AST: 25 U/L (ref 15–41)
Albumin: 3.6 g/dL (ref 3.5–5.0)
Alkaline Phosphatase: 79 U/L (ref 38–126)
BUN: 33 mg/dL — AB (ref 8–23)
CO2: 24 mmol/L (ref 22–32)
Calcium: 10 mg/dL (ref 8.9–10.3)
Chloride: 103 mmol/L (ref 98–111)
Creatinine: 1.11 mg/dL (ref 0.61–1.24)
GFR, Est AFR Am: >60 mL/min (ref >60)
Glucose, Bld: 93 mg/dL (ref 70–99)
POTASSIUM: 4.4 mmol/L (ref 3.5–5.1)
Sodium: 138 mmol/L (ref 135–145)
Total Bilirubin: 0.5 mg/dL (ref 0.3–1.2)
Total Protein: 7.2 g/dL (ref 6.5–8.1)

## 2018-05-29 LAB — CBC WITH DIFFERENTIAL (CANCER CENTER ONLY)
BASOS ABS: 0 10*3/uL (ref 0.0–0.1)
Basophils Relative: 0 %
Eosinophils Absolute: 0.2 10*3/uL (ref 0.0–0.5)
Eosinophils Relative: 4 %
HCT: 38.1 % — ABNORMAL LOW (ref 38.4–49.9)
Hemoglobin: 12.9 g/dL — ABNORMAL LOW (ref 13.0–17.1)
LYMPHS ABS: 0.8 10*3/uL — AB (ref 0.9–3.3)
LYMPHS PCT: 15 %
MCH: 35.5 pg — ABNORMAL HIGH (ref 27.2–33.4)
MCHC: 34 g/dL (ref 32.0–36.0)
MCV: 104.6 fL — AB (ref 79.3–98.0)
Monocytes Absolute: 0.8 10*3/uL (ref 0.1–0.9)
Monocytes Relative: 14 %
NEUTROS ABS: 3.6 10*3/uL (ref 1.5–6.5)
NEUTROS PCT: 67 %
PLATELETS: 118 10*3/uL — AB (ref 140–400)
RBC: 3.64 MIL/uL — ABNORMAL LOW (ref 4.20–5.82)
RDW: 14.5 % (ref 11.0–14.6)
WBC: 5.3 10*3/uL (ref 4.0–10.3)

## 2018-05-29 MED ORDER — PROCHLORPERAZINE MALEATE 10 MG PO TABS: 10.0000 mg | ORAL_TABLET | Freq: Once | ORAL | Status: DC

## 2018-05-29 MED ORDER — BORTEZOMIB CHEMO SQ INJECTION 3.5 MG (2.5MG/ML)
1.3000 mg/m2 | Freq: Once | INTRAMUSCULAR | Status: AC
  Administered 2018-05-29: 2.5 mg via SUBCUTANEOUS
  Filled 2018-05-29: qty 1

## 2018-05-29 NOTE — Patient Instructions (Signed)
[facility] Discharge Instructions for Patients Receiving Chemotherapy  Today you received the following chemotherapy agents Velcade.  To help prevent nausea and vomiting after your treatment, we encourage you to take your nausea medication as directed.  If you develop nausea and vomiting that is not controlled by your nausea medication, call the clinic.   BELOW ARE SYMPTOMS THAT SHOULD BE REPORTED IMMEDIATELY:  *FEVER GREATER THAN 100.5 F  *CHILLS WITH OR WITHOUT FEVER  NAUSEA AND VOMITING THAT IS NOT CONTROLLED WITH YOUR NAUSEA MEDICATION  *UNUSUAL SHORTNESS OF BREATH  *UNUSUAL BRUISING OR BLEEDING  TENDERNESS IN MOUTH AND THROAT WITH OR WITHOUT PRESENCE OF ULCERS  *URINARY PROBLEMS  *BOWEL PROBLEMS  UNUSUAL RASH Items with * indicate a potential emergency and should be followed up as soon as possible.  Feel free to call the clinic should you have any questions or concerns. The clinic phone number is [phone].  Please show the CHEMO ALERT CARD at check-in to the Emergency Department and triage nurse.   

## 2018-05-30 LAB — MULTIPLE MYELOMA PANEL, SERUM
ALBUMIN/GLOB SERPL: 1.1 (ref 0.7–1.7)
ALPHA2 GLOB SERPL ELPH-MCNC: 0.8 g/dL (ref 0.4–1.0)
Albumin SerPl Elph-Mcnc: 3.3 g/dL (ref 2.9–4.4)
Alpha 1: 0.3 g/dL (ref 0.0–0.4)
B-GLOBULIN SERPL ELPH-MCNC: 1.1 g/dL (ref 0.7–1.3)
GAMMA GLOB SERPL ELPH-MCNC: 1.2 g/dL (ref 0.4–1.8)
GLOBULIN, TOTAL: 3.3 g/dL (ref 2.2–3.9)
IGG (IMMUNOGLOBIN G), SERUM: 1310 mg/dL (ref 700–1600)
IgA: 115 mg/dL (ref 61–437)
IgM (Immunoglobulin M), Srm: 77 mg/dL (ref 20–172)
M PROTEIN SERPL ELPH-MCNC: 0.2 g/dL — AB
TOTAL PROTEIN ELP: 6.6 g/dL (ref 6.0–8.5)

## 2018-05-30 LAB — KAPPA/LAMBDA LIGHT CHAINS
KAPPA FREE LGHT CHN: 33.2 mg/L — AB (ref 3.3–19.4)
Kappa, lambda light chain ratio: 2.08 — ABNORMAL HIGH (ref 0.26–1.65)
LAMDA FREE LIGHT CHAINS: 16 mg/L (ref 5.7–26.3)

## 2018-06-12 ENCOUNTER — Inpatient Hospital Stay: Payer: 59

## 2018-06-12 VITALS — BP 133/74 | HR 64 | Temp 97.8°F | Resp 18

## 2018-06-12 DIAGNOSIS — C9002 Multiple myeloma in relapse: Secondary | ICD-10-CM

## 2018-06-12 DIAGNOSIS — Z9481 Bone marrow transplant status: Secondary | ICD-10-CM

## 2018-06-12 DIAGNOSIS — Z5112 Encounter for antineoplastic immunotherapy: Secondary | ICD-10-CM | POA: Diagnosis not present

## 2018-06-12 DIAGNOSIS — C9000 Multiple myeloma not having achieved remission: Secondary | ICD-10-CM

## 2018-06-12 LAB — CMP (CANCER CENTER ONLY)
ALBUMIN: 3.8 g/dL (ref 3.5–5.0)
ALT: 32 U/L (ref 0–44)
AST: 31 U/L (ref 15–41)
Alkaline Phosphatase: 98 U/L (ref 38–126)
Anion gap: 9 (ref 5–15)
BUN: 30 mg/dL — AB (ref 8–23)
CALCIUM: 9.8 mg/dL (ref 8.9–10.3)
CO2: 24 mmol/L (ref 22–32)
Chloride: 104 mmol/L (ref 98–111)
Creatinine: 1.03 mg/dL (ref 0.61–1.24)
GFR, Est AFR Am: >60 mL/min (ref >60)
GFR, Estimated: >60 mL/min (ref >60)
GLUCOSE: 90 mg/dL (ref 70–99)
Potassium: 4.8 mmol/L (ref 3.5–5.1)
Sodium: 137 mmol/L (ref 135–145)
TOTAL PROTEIN: 7.5 g/dL (ref 6.5–8.1)
Total Bilirubin: 0.5 mg/dL (ref 0.3–1.2)

## 2018-06-12 LAB — CBC WITH DIFFERENTIAL (CANCER CENTER ONLY)
BASOS ABS: 0 10*3/uL (ref 0.0–0.1)
BASOS PCT: 1 %
EOS PCT: 5 %
Eosinophils Absolute: 0.2 10*3/uL (ref 0.0–0.5)
HEMATOCRIT: 38.2 % — AB (ref 38.4–49.9)
Hemoglobin: 13 g/dL (ref 13.0–17.1)
LYMPHS PCT: 24 %
Lymphs Abs: 1 10*3/uL (ref 0.9–3.3)
MCH: 35.6 pg — ABNORMAL HIGH (ref 27.2–33.4)
MCHC: 34 g/dL (ref 32.0–36.0)
MCV: 104.7 fL — AB (ref 79.3–98.0)
MONO ABS: 0.7 10*3/uL (ref 0.1–0.9)
Monocytes Relative: 17 %
Neutro Abs: 2.2 10*3/uL (ref 1.5–6.5)
Neutrophils Relative %: 53 %
Platelet Count: 112 10*3/uL — ABNORMAL LOW (ref 140–400)
RBC: 3.65 MIL/uL — ABNORMAL LOW (ref 4.20–5.82)
RDW: 14.4 % (ref 11.0–14.6)
WBC Count: 4.1 10*3/uL (ref 4.0–10.3)

## 2018-06-12 MED ORDER — ZOLEDRONIC ACID 4 MG/100ML IV SOLN
4.0000 mg | Freq: Once | INTRAVENOUS | Status: AC
  Administered 2018-06-12: 4 mg via INTRAVENOUS
  Filled 2018-06-12: qty 100

## 2018-06-12 MED ORDER — PROCHLORPERAZINE MALEATE 10 MG PO TABS: 10.0000 mg | ORAL_TABLET | Freq: Once | ORAL | Status: DC

## 2018-06-12 MED ORDER — BORTEZOMIB CHEMO SQ INJECTION 3.5 MG (2.5MG/ML)
1.3000 mg/m2 | Freq: Once | INTRAMUSCULAR | Status: AC
  Administered 2018-06-12: 2.5 mg via SUBCUTANEOUS
  Filled 2018-06-12: qty 1

## 2018-06-12 NOTE — Patient Instructions (Signed)
[facility] Discharge Instructions for Patients Receiving Chemotherapy  Today you received the following chemotherapy agents Velcade.  To help prevent nausea and vomiting after your treatment, we encourage you to take your nausea medication as directed.  If you develop nausea and vomiting that is not controlled by your nausea medication, call the clinic.   BELOW ARE SYMPTOMS THAT SHOULD BE REPORTED IMMEDIATELY:  *FEVER GREATER THAN 100.5 F  *CHILLS WITH OR WITHOUT FEVER  NAUSEA AND VOMITING THAT IS NOT CONTROLLED WITH YOUR NAUSEA MEDICATION  *UNUSUAL SHORTNESS OF BREATH  *UNUSUAL BRUISING OR BLEEDING  TENDERNESS IN MOUTH AND THROAT WITH OR WITHOUT PRESENCE OF ULCERS  *URINARY PROBLEMS  *BOWEL PROBLEMS  UNUSUAL RASH Items with * indicate a potential emergency and should be followed up as soon as possible.  Feel free to call the clinic should you have any questions or concerns. The clinic phone number is [phone].  Please show the CHEMO ALERT CARD at check-in to the Emergency Department and triage nurse.   

## 2018-06-13 LAB — KAPPA/LAMBDA LIGHT CHAINS
KAPPA, LAMDA LIGHT CHAIN RATIO: 1.77 — AB (ref 0.26–1.65)
Kappa free light chain: 29.8 mg/L — ABNORMAL HIGH (ref 3.3–19.4)
LAMDA FREE LIGHT CHAINS: 16.8 mg/L (ref 5.7–26.3)

## 2018-06-14 LAB — MULTIPLE MYELOMA PANEL, SERUM
Albumin SerPl Elph-Mcnc: 3.6 g/dL (ref 2.9–4.4)
Albumin/Glob SerPl: 1.2 (ref 0.7–1.7)
Alpha 1: 0.2 g/dL (ref 0.0–0.4)
Alpha2 Glob SerPl Elph-Mcnc: 0.7 g/dL (ref 0.4–1.0)
B-Globulin SerPl Elph-Mcnc: 1 g/dL (ref 0.7–1.3)
Gamma Glob SerPl Elph-Mcnc: 1.3 g/dL (ref 0.4–1.8)
Globulin, Total: 3.2 g/dL (ref 2.2–3.9)
IGM (IMMUNOGLOBULIN M), SRM: 139 mg/dL (ref 20–172)
IgA: 123 mg/dL (ref 61–437)
IgG (Immunoglobin G), Serum: 1352 mg/dL (ref 700–1600)
M PROTEIN SERPL ELPH-MCNC: 0.3 g/dL — AB
TOTAL PROTEIN ELP: 6.8 g/dL (ref 6.0–8.5)

## 2018-06-25 ENCOUNTER — Other Ambulatory Visit: Payer: Self-pay | Admitting: Oncology

## 2018-06-26 ENCOUNTER — Inpatient Hospital Stay: Payer: 59 | Attending: Oncology

## 2018-06-26 ENCOUNTER — Inpatient Hospital Stay: Payer: 59

## 2018-06-26 VITALS — BP 132/75 | HR 64 | Temp 98.8°F | Resp 18

## 2018-06-26 DIAGNOSIS — Z9481 Bone marrow transplant status: Secondary | ICD-10-CM

## 2018-06-26 DIAGNOSIS — Z5112 Encounter for antineoplastic immunotherapy: Secondary | ICD-10-CM | POA: Diagnosis not present

## 2018-06-26 DIAGNOSIS — C9000 Multiple myeloma not having achieved remission: Secondary | ICD-10-CM

## 2018-06-26 LAB — CMP (CANCER CENTER ONLY)
ALBUMIN: 3.6 g/dL (ref 3.5–5.0)
ALT: 31 U/L (ref 0–44)
AST: 28 U/L (ref 15–41)
Alkaline Phosphatase: 100 U/L (ref 38–126)
Anion gap: 8 (ref 5–15)
BILIRUBIN TOTAL: 0.5 mg/dL (ref 0.3–1.2)
BUN: 28 mg/dL — ABNORMAL HIGH (ref 8–23)
CO2: 22 mmol/L (ref 22–32)
CREATININE: 0.99 mg/dL (ref 0.61–1.24)
Calcium: 9.8 mg/dL (ref 8.9–10.3)
Chloride: 107 mmol/L (ref 98–111)
Glucose, Bld: 78 mg/dL (ref 70–99)
Potassium: 4.6 mmol/L (ref 3.5–5.1)
Sodium: 137 mmol/L (ref 135–145)
TOTAL PROTEIN: 7.4 g/dL (ref 6.5–8.1)

## 2018-06-26 LAB — CBC WITH DIFFERENTIAL (CANCER CENTER ONLY)
Abs Immature Granulocytes: 0.02 10*3/uL (ref 0.00–0.07)
BASOS ABS: 0 10*3/uL (ref 0.0–0.1)
BASOS PCT: 0 %
EOS ABS: 0.2 10*3/uL (ref 0.0–0.5)
EOS PCT: 3 %
HCT: 37.4 % — ABNORMAL LOW (ref 39.0–52.0)
HEMOGLOBIN: 12.9 g/dL — AB (ref 13.0–17.0)
Immature Granulocytes: 0 %
Lymphocytes Relative: 22 %
Lymphs Abs: 1.2 10*3/uL (ref 0.7–4.0)
MCH: 35 pg — ABNORMAL HIGH (ref 26.0–34.0)
MCHC: 34.5 g/dL (ref 30.0–36.0)
MCV: 101.4 fL — ABNORMAL HIGH (ref 80.0–100.0)
Monocytes Absolute: 0.9 10*3/uL (ref 0.1–1.0)
Monocytes Relative: 17 %
NRBC: 0 % (ref 0.0–0.2)
Neutro Abs: 3.1 10*3/uL (ref 1.7–7.7)
Neutrophils Relative %: 58 %
PLATELETS: 101 10*3/uL — AB (ref 150–400)
RBC: 3.69 MIL/uL — AB (ref 4.22–5.81)
RDW: 13.6 % (ref 11.5–15.5)
WBC: 5.4 10*3/uL (ref 4.0–10.5)

## 2018-06-26 MED ORDER — PROCHLORPERAZINE MALEATE 10 MG PO TABS: 10.0000 mg | ORAL_TABLET | Freq: Once | ORAL | Status: DC

## 2018-06-26 MED ORDER — BORTEZOMIB CHEMO SQ INJECTION 3.5 MG (2.5MG/ML)
1.3000 mg/m2 | Freq: Once | INTRAMUSCULAR | Status: AC
  Administered 2018-06-26: 2.5 mg via SUBCUTANEOUS
  Filled 2018-06-26: qty 1

## 2018-06-26 NOTE — Patient Instructions (Signed)
[facility] Discharge Instructions for Patients Receiving Chemotherapy  Today you received the following chemotherapy agents Velcade.  To help prevent nausea and vomiting after your treatment, we encourage you to take your nausea medication as directed.  If you develop nausea and vomiting that is not controlled by your nausea medication, call the clinic.   BELOW ARE SYMPTOMS THAT SHOULD BE REPORTED IMMEDIATELY:  *FEVER GREATER THAN 100.5 F  *CHILLS WITH OR WITHOUT FEVER  NAUSEA AND VOMITING THAT IS NOT CONTROLLED WITH YOUR NAUSEA MEDICATION  *UNUSUAL SHORTNESS OF BREATH  *UNUSUAL BRUISING OR BLEEDING  TENDERNESS IN MOUTH AND THROAT WITH OR WITHOUT PRESENCE OF ULCERS  *URINARY PROBLEMS  *BOWEL PROBLEMS  UNUSUAL RASH Items with * indicate a potential emergency and should be followed up as soon as possible.  Feel free to call the clinic should you have any questions or concerns. The clinic phone number is [phone].  Please show the CHEMO ALERT CARD at check-in to the Emergency Department and triage nurse.   

## 2018-06-27 ENCOUNTER — Encounter: Payer: Self-pay | Admitting: Oncology

## 2018-06-27 LAB — KAPPA/LAMBDA LIGHT CHAINS
Kappa free light chain: 38.1 mg/L — ABNORMAL HIGH (ref 3.3–19.4)
Kappa, lambda light chain ratio: 1.89 — ABNORMAL HIGH (ref 0.26–1.65)
Lambda free light chains: 20.2 mg/L (ref 5.7–26.3)

## 2018-06-28 LAB — MULTIPLE MYELOMA PANEL, SERUM
ALBUMIN/GLOB SERPL: 1.1 (ref 0.7–1.7)
ALPHA2 GLOB SERPL ELPH-MCNC: 0.7 g/dL (ref 0.4–1.0)
Albumin SerPl Elph-Mcnc: 3.5 g/dL (ref 2.9–4.4)
Alpha 1: 0.2 g/dL (ref 0.0–0.4)
B-Globulin SerPl Elph-Mcnc: 1 g/dL (ref 0.7–1.3)
Gamma Glob SerPl Elph-Mcnc: 1.3 g/dL (ref 0.4–1.8)
Globulin, Total: 3.2 g/dL (ref 2.2–3.9)
IGG (IMMUNOGLOBIN G), SERUM: 1415 mg/dL (ref 700–1600)
IGM (IMMUNOGLOBULIN M), SRM: 113 mg/dL (ref 20–172)
IgA: 134 mg/dL (ref 61–437)
M PROTEIN SERPL ELPH-MCNC: 0.3 g/dL — AB
TOTAL PROTEIN ELP: 6.7 g/dL (ref 6.0–8.5)

## 2018-07-01 ENCOUNTER — Other Ambulatory Visit: Payer: Self-pay

## 2018-07-01 MED ORDER — ONDANSETRON HCL 8 MG PO TABS: ORAL_TABLET | ORAL | 0 refills | Status: DC

## 2018-07-10 ENCOUNTER — Inpatient Hospital Stay: Payer: 59

## 2018-07-10 VITALS — BP 127/61 | HR 58 | Temp 98.4°F | Resp 16 | Ht 69.0 in | Wt 183.2 lb

## 2018-07-10 DIAGNOSIS — C9000 Multiple myeloma not having achieved remission: Secondary | ICD-10-CM

## 2018-07-10 DIAGNOSIS — Z9481 Bone marrow transplant status: Secondary | ICD-10-CM

## 2018-07-10 DIAGNOSIS — Z5112 Encounter for antineoplastic immunotherapy: Secondary | ICD-10-CM | POA: Diagnosis not present

## 2018-07-10 LAB — CMP (CANCER CENTER ONLY)
ALBUMIN: 3.7 g/dL (ref 3.5–5.0)
ALT: 22 U/L (ref 0–44)
AST: 25 U/L (ref 15–41)
Alkaline Phosphatase: 85 U/L (ref 38–126)
Anion gap: 10 (ref 5–15)
BUN: 32 mg/dL — AB (ref 8–23)
CALCIUM: 9.7 mg/dL (ref 8.9–10.3)
CO2: 23 mmol/L (ref 22–32)
CREATININE: 1.03 mg/dL (ref 0.61–1.24)
Chloride: 104 mmol/L (ref 98–111)
GFR, Est AFR Am: >60 mL/min (ref >60)
GFR, Estimated: >60 mL/min (ref >60)
GLUCOSE: 77 mg/dL (ref 70–99)
Potassium: 4.8 mmol/L (ref 3.5–5.1)
SODIUM: 137 mmol/L (ref 135–145)
TOTAL PROTEIN: 7.3 g/dL (ref 6.5–8.1)
Total Bilirubin: 0.4 mg/dL (ref 0.3–1.2)

## 2018-07-10 LAB — CBC WITH DIFFERENTIAL (CANCER CENTER ONLY)
ABS IMMATURE GRANULOCYTES: 0.01 10*3/uL (ref 0.00–0.07)
BASOS ABS: 0 10*3/uL (ref 0.0–0.1)
Basophils Relative: 0 %
Eosinophils Absolute: 0.1 10*3/uL (ref 0.0–0.5)
Eosinophils Relative: 3 %
HCT: 36.3 % — ABNORMAL LOW (ref 39.0–52.0)
Hemoglobin: 12.5 g/dL — ABNORMAL LOW (ref 13.0–17.0)
IMMATURE GRANULOCYTES: 0 %
LYMPHS ABS: 1.1 10*3/uL (ref 0.7–4.0)
LYMPHS PCT: 23 %
MCH: 35.7 pg — ABNORMAL HIGH (ref 26.0–34.0)
MCHC: 34.4 g/dL (ref 30.0–36.0)
MCV: 103.7 fL — ABNORMAL HIGH (ref 80.0–100.0)
Monocytes Absolute: 0.9 10*3/uL (ref 0.1–1.0)
Monocytes Relative: 18 %
NEUTROS ABS: 2.6 10*3/uL (ref 1.7–7.7)
NEUTROS PCT: 56 %
NRBC: 0 % (ref 0.0–0.2)
Platelet Count: 117 10*3/uL — ABNORMAL LOW (ref 150–400)
RBC: 3.5 MIL/uL — ABNORMAL LOW (ref 4.22–5.81)
RDW: 14 % (ref 11.5–15.5)
WBC Count: 4.7 10*3/uL (ref 4.0–10.5)

## 2018-07-10 MED ORDER — BORTEZOMIB CHEMO SQ INJECTION 3.5 MG (2.5MG/ML)
1.3000 mg/m2 | Freq: Once | INTRAMUSCULAR | Status: AC
  Administered 2018-07-10: 2.5 mg via SUBCUTANEOUS
  Filled 2018-07-10: qty 1

## 2018-07-10 MED ORDER — PROCHLORPERAZINE MALEATE 10 MG PO TABS: 10.0000 mg | ORAL_TABLET | Freq: Once | ORAL | Status: DC

## 2018-07-10 NOTE — Patient Instructions (Signed)
[facility] Discharge Instructions for Patients Receiving Chemotherapy  Today you received the following chemotherapy agents: Bortezomib (Velcade)  To help prevent nausea and vomiting after your treatment, we encourage you to take your nausea medication as directed.    If you develop nausea and vomiting that is not controlled by your nausea medication, call the clinic.   BELOW ARE SYMPTOMS THAT SHOULD BE REPORTED IMMEDIATELY:  *FEVER GREATER THAN 100.5 F  *CHILLS WITH OR WITHOUT FEVER  NAUSEA AND VOMITING THAT IS NOT CONTROLLED WITH YOUR NAUSEA MEDICATION  *UNUSUAL SHORTNESS OF BREATH  *UNUSUAL BRUISING OR BLEEDING  TENDERNESS IN MOUTH AND THROAT WITH OR WITHOUT PRESENCE OF ULCERS  *URINARY PROBLEMS  *BOWEL PROBLEMS  UNUSUAL RASH Items with * indicate a potential emergency and should be followed up as soon as possible.  Feel free to call the clinic should you have any questions or concerns. The clinic phone number is [phone].  Please show the CHEMO ALERT CARD at check-in to the Emergency Department and triage nurse.   

## 2018-07-11 ENCOUNTER — Encounter: Payer: Self-pay | Admitting: Oncology

## 2018-07-11 ENCOUNTER — Other Ambulatory Visit: Payer: Self-pay | Admitting: Oncology

## 2018-07-11 LAB — KAPPA/LAMBDA LIGHT CHAINS
Kappa free light chain: 50.8 mg/L — ABNORMAL HIGH (ref 3.3–19.4)
Kappa, lambda light chain ratio: 3.16 — ABNORMAL HIGH (ref 0.26–1.65)
Lambda free light chains: 16.1 mg/L (ref 5.7–26.3)

## 2018-07-12 LAB — MULTIPLE MYELOMA PANEL, SERUM
ALPHA 1: 0.2 g/dL (ref 0.0–0.4)
ALPHA2 GLOB SERPL ELPH-MCNC: 0.7 g/dL (ref 0.4–1.0)
Albumin SerPl Elph-Mcnc: 3.6 g/dL (ref 2.9–4.4)
Albumin/Glob SerPl: 1.1 (ref 0.7–1.7)
B-GLOBULIN SERPL ELPH-MCNC: 1 g/dL (ref 0.7–1.3)
Gamma Glob SerPl Elph-Mcnc: 1.3 g/dL (ref 0.4–1.8)
Globulin, Total: 3.3 g/dL (ref 2.2–3.9)
IGG (IMMUNOGLOBIN G), SERUM: 1393 mg/dL (ref 700–1600)
IGM (IMMUNOGLOBULIN M), SRM: 88 mg/dL (ref 20–172)
IgA: 125 mg/dL (ref 61–437)
M PROTEIN SERPL ELPH-MCNC: 0.2 g/dL — AB
TOTAL PROTEIN ELP: 6.9 g/dL (ref 6.0–8.5)

## 2018-07-23 ENCOUNTER — Other Ambulatory Visit: Payer: Self-pay | Admitting: Internal Medicine

## 2018-07-23 ENCOUNTER — Other Ambulatory Visit: Payer: Self-pay | Admitting: Oncology

## 2018-07-24 ENCOUNTER — Telehealth: Payer: Self-pay | Admitting: Oncology

## 2018-07-24 ENCOUNTER — Inpatient Hospital Stay: Payer: 59

## 2018-07-24 ENCOUNTER — Inpatient Hospital Stay: Payer: 59 | Attending: Oncology | Admitting: Oncology

## 2018-07-24 VITALS — BP 121/67 | HR 73 | Temp 97.5°F | Resp 18 | Ht 69.0 in | Wt 181.8 lb

## 2018-07-24 DIAGNOSIS — Z79899 Other long term (current) drug therapy: Secondary | ICD-10-CM | POA: Diagnosis not present

## 2018-07-24 DIAGNOSIS — Z9481 Bone marrow transplant status: Secondary | ICD-10-CM

## 2018-07-24 DIAGNOSIS — G629 Polyneuropathy, unspecified: Secondary | ICD-10-CM | POA: Diagnosis not present

## 2018-07-24 DIAGNOSIS — C9000 Multiple myeloma not having achieved remission: Secondary | ICD-10-CM | POA: Diagnosis not present

## 2018-07-24 DIAGNOSIS — Z5112 Encounter for antineoplastic immunotherapy: Secondary | ICD-10-CM | POA: Insufficient documentation

## 2018-07-24 DIAGNOSIS — F419 Anxiety disorder, unspecified: Secondary | ICD-10-CM | POA: Diagnosis not present

## 2018-07-24 DIAGNOSIS — E039 Hypothyroidism, unspecified: Secondary | ICD-10-CM | POA: Insufficient documentation

## 2018-07-24 DIAGNOSIS — F329 Major depressive disorder, single episode, unspecified: Secondary | ICD-10-CM | POA: Insufficient documentation

## 2018-07-24 DIAGNOSIS — I10 Essential (primary) hypertension: Secondary | ICD-10-CM | POA: Insufficient documentation

## 2018-07-24 LAB — CBC WITH DIFFERENTIAL (CANCER CENTER ONLY)
ABS IMMATURE GRANULOCYTES: 0.01 10*3/uL (ref 0.00–0.07)
BASOS PCT: 0 %
Basophils Absolute: 0 10*3/uL (ref 0.0–0.1)
Eosinophils Absolute: 0.1 10*3/uL (ref 0.0–0.5)
Eosinophils Relative: 3 %
HEMATOCRIT: 38.8 % — AB (ref 39.0–52.0)
HEMOGLOBIN: 13.5 g/dL (ref 13.0–17.0)
IMMATURE GRANULOCYTES: 0 %
Lymphocytes Relative: 26 %
Lymphs Abs: 1.3 10*3/uL (ref 0.7–4.0)
MCH: 35.5 pg — ABNORMAL HIGH (ref 26.0–34.0)
MCHC: 34.8 g/dL (ref 30.0–36.0)
MCV: 102.1 fL — ABNORMAL HIGH (ref 80.0–100.0)
Monocytes Absolute: 0.9 10*3/uL (ref 0.1–1.0)
Monocytes Relative: 17 %
NEUTROS ABS: 2.7 10*3/uL (ref 1.7–7.7)
Neutrophils Relative %: 54 %
Platelet Count: 115 10*3/uL — ABNORMAL LOW (ref 150–400)
RBC: 3.8 MIL/uL — AB (ref 4.22–5.81)
RDW: 13.8 % (ref 11.5–15.5)
WBC: 5.1 10*3/uL (ref 4.0–10.5)
nRBC: 0 % (ref 0.0–0.2)

## 2018-07-24 LAB — CMP (CANCER CENTER ONLY)
ALBUMIN: 3.9 g/dL (ref 3.5–5.0)
ALK PHOS: 81 U/L (ref 38–126)
ALT: 23 U/L (ref 0–44)
ANION GAP: 8 (ref 5–15)
AST: 30 U/L (ref 15–41)
BILIRUBIN TOTAL: 0.4 mg/dL (ref 0.3–1.2)
BUN: 26 mg/dL — ABNORMAL HIGH (ref 8–23)
CHLORIDE: 108 mmol/L (ref 98–111)
CO2: 22 mmol/L (ref 22–32)
CREATININE: 1.09 mg/dL (ref 0.61–1.24)
Calcium: 9.8 mg/dL (ref 8.9–10.3)
GFR, Estimated: >60 mL/min (ref >60)
Glucose, Bld: 88 mg/dL (ref 70–99)
POTASSIUM: 4.6 mmol/L (ref 3.5–5.1)
SODIUM: 138 mmol/L (ref 135–145)
Total Protein: 7.5 g/dL (ref 6.5–8.1)

## 2018-07-24 MED ORDER — PROCHLORPERAZINE MALEATE 10 MG PO TABS: 10.0000 mg | ORAL_TABLET | Freq: Once | ORAL | Status: DC

## 2018-07-24 MED ORDER — BORTEZOMIB CHEMO SQ INJECTION 3.5 MG (2.5MG/ML)
1.3000 mg/m2 | Freq: Once | INTRAMUSCULAR | Status: AC
  Administered 2018-07-24: 2.5 mg via SUBCUTANEOUS
  Filled 2018-07-24: qty 1

## 2018-07-24 NOTE — Progress Notes (Signed)
[place]  Telephone:([phone]) [phone] Fax:(336) [phone]    ID: Andre [name]  DOB: 08-Nov-1950  MR#: [id number] CSN#: [id number]   Patient Care Team: Cassandria Anger, MD as PCP - General (Internal Medicine) Tykeem Lanzer, [name], MD as Consulting Physician (Oncology) Concepcion Living, MD as Consulting Physician (Neurology) [name], MD as Consulting Physician (Internal Medicine) Kathrynn Ducking, MD as Consulting Physician (Neurology) Dorna Bloom, Belcher as Referring Physician (Oral Surgery)   CHIEF COMPLAINT: Multiple myeloma  CURRENT TREATMENT: Maintenance therapy with bortezomib, zolendronate  INTERVAL HISTORY:  "Andre [name]" returns today for follow-up and treatment of his multiple myeloma. He is accompanied by his wife Andre [name] today. He is doing well overall.   The patient continues on bortezomid every 14 days, he notes that he now has a 4 day recovery versus a 2 day recovery.   He also receives zolendronate every 12 weeks, with this most recent dose 06/12/2018.2  Recall he has been off pomalidomide since January 2019.  Since his last visit here, he had a fall.  He had been running, and simply went forward and landed on his right rib cage and left head.  That he did not trip or slip and he did not feel that his legs were weak under him.  He underwent a chest x-ray on 05/23/2018, showing slightly displaced fracture of the posterior right fifth rib. Probable nondisplaced fracture of the posterior right fourth rib. Lungs are clear. No pleural effusion or pneumothorax seen.   He also underwent a Head CT w/o contrast on 05/23/2018, showing widespread lytic calvarial lesions consistent with known multiple myeloma, stable. Atrophy with periventricular small vessel disease. Prior small infarcts, most notably in the posterior mid left cerebellum, stable. No acute infarct. No mass or hemorrhage. There are foci of arterial vascular calcification.    REVIEW OF  SYSTEMS: "Andre [name]" reports that for exercise, he notes that he was exercising with bands, increasing his walking, and then started jogging prior to his fall. At the incidence of his fall, he notes that he was jogging in the morning about 2 miles when he suddenly fell down onto the pavement and feeling as if he was "pulled forward". He denies tripping, slipping, or his legs becoming weak. He struck his head and broke several ribs due to this incident. He has started back with walking and completing band work and he would like to build back up to running again. He works part-time from home. He denies unusual headaches, visual changes, nausea, vomiting, or dizziness. There has been no unusual cough, phlegm production, or pleurisy. This been no change in bowel or bladder habits. He denies unexplained fatigue or unexplained weight loss, bleeding, rash, or fever. A detailed review of systems was otherwise stable.        HISTORY OF PRESENT ILLNESS:  From the original intake nodes:  The patient was worked up for peptic ulcer disease in August of 2011, with significant bleeding and anemia.  The patient was Helicobacter pylori negative.  He received some epinephrine when he had his EGD and then started on Protonix.  [name] anemia slowly resolved so that by September, his hemoglobin was up to 10.9 and by earlier this month, his hemoglobin was up to 12.5.    As part of his general workup, he was found to have a slightly elevated globulin fraction.  In September, his total protein was 8.3 with an albumin of 3.8.  In January, the total protein was 8.4 and albumin  3.6.  With persistence of this slight abnormality, Dr. Olevia Perches obtained serum immunofixation and SPEP.  The SPTP showed an M-spike of 2.67 grams.  A total IgG was 4,190.  Total IgA low at 28.  Total IgM low at 28 also.  The immunofixation showed a monoclonal IgG lambda paraprotein.  There were also monoclonal free lambda light chains present. With this  information, the patient was referred for further evaluation.   A diagnosis of myeloma was confirmed by bone marrow biopsy and subsequebnt treatment is as detailed below  PAST MEDICAL HISTORY: Past Medical History:  Diagnosis Date  . Allergy   . Anemia   . Arthritis   . Asthma    no treatment x 20 years  . Depression   . Double vision    occurs at times   . Duodenal ulcer   . GERD (gastroesophageal reflux disease)   . Hyperlipidemia   . Hypertension   . Hypothyroidism   . Multiple myeloma 07/04/2011  . Thyroid disease    Significant for peptic ulcer disease as noted above.  History of hyperlipidemia.  History of anxiety and depression.  History of GERD which is significantly improved with weight loss and history of reactive airway disease, possibly secondary to the GERD which also improved with weight loss.  There was a history of obesity, now much improved secondary to exercise and diet.   PAST SURGICAL HISTORY: Past Surgical History:  Procedure Laterality Date  . BONE MARROW TRANSPLANT  2011   for MM  . CARDIOLITE STUDY  11/25/2003   NORMAL  . TONSILLECTOMY       FAMILY HISTORY Family History  Problem Relation Age of Onset  . Throat cancer Mother   . Hypertension Father   . Stroke Father   . Asthma Father   . Diabetes Father   . Pancreatic cancer Brother    [name] father died at the age of 28 following a stroke.  [name] mother died with cancer of the throat at the age of 21.   She had a history of depression and was a smoker.  Not clear how much alcohol she drank, according to the patient.  The patient had two brothers.  One died from the age of 57 from a "kidney problem."  The second one died at the age of 31 from pancreatic cancer. (This may have been duodenal cancer. 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  His wife of 37+ years was Andre [name], but they divorced. In 2016 he remarried, his current wife's name is Andre [name].  The patient has two daughters:  Andre [name], who works in [place], who had her second child April 2016. Both daughters live here in [place]. The patient is very close to his daughters.  Andre [name] first daughter, Andre [name], born 03/06/2013, apparently has Weber/Osler/Rendu. The patient is not a church attender.  Andre [name] also participates in [place] even though actually there is no alcohol or drug problem in the family.  He participates in this group and he gets quite a bit of support from it.     ADVANCED DIRECTIVES: In place  HEALTH MAINTENANCE: (updated 07/16/2013) Social History   Tobacco Use  . Smoking status: Former Smoker    Years: 3.00    Last attempt to quit: 03/27/1969    Years since quitting:  49.3  . Smokeless tobacco: Never Used  Substance Use Topics  . Alcohol use: No  . Drug use: No    Colonoscopy:  PSA:  Sept 2014, normal/Dr. [name]  Lipid panel: Sept 2014/Dr. [name]  Allergies  Allergen Reactions  . Atorvastatin Other (See Comments)  . Rosuvastatin     Other reaction(s): Liver Disorder  . Crestor [Rosuvastatin Calcium]     ADVERSE EFFECTS ON LIVER  . Lipitor [Atorvastatin Calcium]   . Septra [Sulfamethoxazole-Trimethoprim] Rash   Current Outpatient Medications  Medication Sig Dispense Refill  . aspirin EC 81 MG tablet Take 1 tablet (81 mg total) by mouth daily. 100 tablet 3  . Quenten complex vitamins tablet Take 1 tablet by mouth daily. 100 tablet 3  . cetirizine (ZYRTEC) 10 MG tablet Take 10 mg by mouth daily.    . Cholecalciferol (VITAMIN D3) 2000 units capsule Take 1 capsule (2,000 Units total) by mouth daily. 100 capsule 3  . docusate sodium (COLACE) 100 MG capsule Take 100 mg by mouth daily as needed for mild constipation or moderate constipation.    [name] escitalopram (LEXAPRO) 10 MG tablet TAKE 1 TABLET BY MOUTH  DAILY 90  tablet 3  . feeding supplement, ENSURE ENLIVE, (ENSURE ENLIVE) LIQD Take 237 mLs by mouth 2 (two) times daily between meals. 237 mL 12  . HYDROcodone-acetaminophen (NORCO) 5-325 MG tablet 1/2 to 2 q 6 hours 30 tablet 0  . hydrOXYzine (ATARAX/VISTARIL) 25 MG tablet Take 1 tablet (25 mg total) by mouth 3 (three) times daily as needed. 30 tablet 0  . levothyroxine (SYNTHROID, LEVOTHROID) 150 MCG tablet TAKE 1 TABLET BY MOUTH  EVERY MORNING ON AN EMPTY  STOMACH 90 tablet 3  . ondansetron (ZOFRAN) 8 MG tablet TAKE 1 TABLET BY MOUTH EVERY 8 HOURS AS NEEDED FOR NAUSEA AND VOMITING 20 tablet 0  . ondansetron (ZOFRAN) 8 MG tablet TAKE 1 TABLET BY MOUTH EVERY 8 HOURS AS NEEDED FOR NAUSEA AND VOMITING 20 tablet 0  . pantoprazole (PROTONIX) 40 MG tablet TAKE 1 TABLET BY MOUTH  TWICE A DAY BEFORE MEALS 180 tablet 3  . ranitidine (ZANTAC) 150 MG tablet Take 1 tablet (150 mg total) by mouth 2 (two) times daily. 90 tablet 3  . sildenafil (VIAGRA) 25 MG tablet Take 1 tablet (25 mg total) by mouth daily as needed for erectile dysfunction. 10 tablet 0  . triamcinolone cream (KENALOG) 0.5 % Apply 1 application topically 4 (four) times daily. 60 g 2  . valACYclovir (VALTREX) 1000 MG tablet Take 1 tablet (1,000 mg total) by mouth daily. 90 tablet 3  . valACYclovir (VALTREX) 1000 MG tablet TAKE 1 TABLET BY MOUTH  DAILY 90 tablet 3  . zolendronic acid (ZOMETA) 4 MG/5ML injection Inject 4 mg into the vein every 3 (three) months.     No current facility-administered medications for this visit.    Objective: Middle-aged white man who appears stated age  58:   07/24/18 1104  BP: 121/67  Pulse: 73  Resp: 18  Temp: (!) 97.5 F (36.4 C)  SpO2: 100%  Body mass index is 26.85 kg/m. Filed Weights   07/24/18 1104  Weight: 181 lb 12.8 oz (82.5 kg)   Sclerae unicteric, EOMs intact, pupils round equal and reactive No cervical or supraclavicular adenopathy Lungs no rales or rhonchi Heart regular rate and  rhythm Abd soft, nontender, positive bowel sounds MSK no focal spinal tenderness, no upper extremity lymphedema Neuro: nonfocal, well oriented, appropriate affect     Appointment  on 07/24/2018  Component Date Value Ref Range Status  . WBC Count 07/24/2018 5.1  4.0 - 10.5 K/uL Final  . RBC 07/24/2018 3.80* 4.22 - 5.81 MIL/uL Final  . Hemoglobin 07/24/2018 13.5  13.0 - 17.0 g/dL Final  . HCT 07/24/2018 38.8* 39.0 - 52.0 % Final  . MCV 07/24/2018 102.1* 80.0 - 100.0 fL Final  . MCH 07/24/2018 35.5* 26.0 - 34.0 pg Final  . MCHC 07/24/2018 34.8  30.0 - 36.0 g/dL Final  . RDW 07/24/2018 13.8  11.5 - 15.5 % Final  . Platelet Count 07/24/2018 115* 150 - 400 K/uL Final  . nRBC 07/24/2018 0.0  0.0 - 0.2 % Final  . Neutrophils Relative % 07/24/2018 54  % Final  . Neutro Abs 07/24/2018 2.7  1.7 - 7.7 K/uL Final  . Lymphocytes Relative 07/24/2018 26  % Final  . Lymphs Abs 07/24/2018 1.3  0.7 - 4.0 K/uL Final  . Monocytes Relative 07/24/2018 17  % Final  . Monocytes Absolute 07/24/2018 0.9  0.1 - 1.0 K/uL Final  . Eosinophils Relative 07/24/2018 3  % Final  . Eosinophils Absolute 07/24/2018 0.1  0.0 - 0.5 K/uL Final  . Basophils Relative 07/24/2018 0  % Final  . Basophils Absolute 07/24/2018 0.0  0.0 - 0.1 K/uL Final  . Immature Granulocytes 07/24/2018 0  % Final  . Abs Immature Granulocytes 07/24/2018 0.01  0.00 - 0.07 K/uL Final   Performed at [facility], [place] [address] [place], [place] [address]  . Sodium 07/24/2018 138  135 - 145 mmol/L Final  . Potassium 07/24/2018 4.6  3.5 - 5.1 mmol/L Final  . Chloride 07/24/2018 108  98 - 111 mmol/L Final  . CO2 07/24/2018 22  22 - 32 mmol/L Final  . Glucose, Bld 07/24/2018 88  70 - 99 mg/dL Final  . BUN 07/24/2018 26* 8 - 23 mg/dL Final  . Creatinine 07/24/2018 1.09  0.61 - 1.24 mg/dL Final  . Calcium 07/24/2018 9.8  8.9 - 10.3 mg/dL Final  . Total Protein 07/24/2018 7.5  6.5 - 8.1 g/dL Final  . Albumin 07/24/2018 3.9   3.5 - 5.0 g/dL Final  . AST 07/24/2018 30  15 - 41 U/L Final  . ALT 07/24/2018 23  0 - 44 U/L Final  . Alkaline Phosphatase 07/24/2018 81  38 - 126 U/L Final  . Total Bilirubin 07/24/2018 0.4  0.3 - 1.2 mg/dL Final  . GFR, Est Non Af Am 07/24/2018 >60  >60 mL/min Final  . GFR, Est AFR Am 07/24/2018 >60  >60 mL/min Final   Comment: (NOTE) The eGFR has been calculated using the CKD EPI equation. This calculation has not been validated in all clinical situations. eGFR's persistently <60 mL/min signify possible Chronic Kidney Disease.   Georgiann Hahn gap 07/24/2018 8  5 - 15 Final   Performed at [facility], [place] [address], [place], [place] [address]   Results for [facility]" (MRN [id number]) as of 07/24/2018 09:38  Ref. Range 05/01/2018 12:03 05/29/2018 08:59 06/12/2018 09:01 06/26/2018 08:52 07/10/2018 08:37  M Protein SerPl Elph-Mcnc Latest Ref Range: Not Observed g/dL Not Observed 0.2 (H) 0.3 (H) 0.3 (H) 0.2 (H)   Results for [facility]" (MRN [id number]) as of 07/24/2018 09:38  Ref. Range 05/15/2018 09:24 05/29/2018 08:59 06/12/2018 09:00 06/26/2018 08:51 07/10/2018 08:36  Kappa, lamda light chain ratio Latest Ref Range: 0.26 - 1.65  2.17 (H) 2.08 (H) 1.77 (H) 1.89 (H)  3.16 (H)   STUDIES: Since his last visit here, he underwent a chest x-ray on 05/23/2018, showing slightly displaced fracture of the posterior right fifth rib. Probable nondisplaced fracture of the posterior right fourth rib. Lungs are clear. No pleural effusion or pneumothorax seen.   He also underwent a Head CT w/o contrast on 05/23/2018, showing widespread lytic calvarial lesions consistent with known multiple myeloma, stable. Atrophy with periventricular small vessel disease. Prior small infarcts, most notably in the posterior mid left cerebellum, stable. No acute infarct. No mass or hemorrhage. There are foci of arterial vascular calcification.  ASSESSMENT: 67 y.o. [place] man with a  history of multiple myeloma diagnosed February of 2012, with an initial M-spike of 2.6 g/dL, IFE showing an IgG lambda paraprotein and free lambda light chains in the urine. Cytogenetics showed trisomy 29. Bone marrow biopsy showed a 22% plasmacytosis. Treated with   (1) bortezomib (subcutaneously), lenalidomide, and dexamethasone, with repeat bone marrow biopsy May of 2012 showing 10% plasmacytosis   (2) high-dose chemotherapy with BCNU and melphalan at [facility], followed by stem cell rescue July of 2012   (3) on zoledronic acid started December of 2012, initially monthly, currently given every 3 months, most recent dose  12/07/2015  (4) low-dose lenalidomide resumed April 2013, interrupted several times.  Resumed again on 02/19/2013, ata dose of 5 mg daily, 21 days on and 7 days off, later further reduced to 7 days on, 7 days off  (5) CNS symptoms and abnormal brain MRI extensively evaluated by neurology with no definitive diagnosis established  (6) rising M spike noted June 2014 but did not meet criteria for carfilzomib study  (7) on lenalidomide 25 mg daily, 14 days on, 7 days off, starting 04/18/2013, interrupted December 2014 because of rash;   (a) resumed January 2015 at 10 mg/ day at 21 days on/ 7 days off  (Zamarian) starting 08/18/2014 decreased to 10 mg/ day 14 days on, 7 days off because of cytopenias  (c) as of February 2016 was on 5 mg daily 7 days on 7 days off  (d) lenalidomide discontinued December 2016 with evidence of disease progression  (8) transient global amnesia 05/29/2015, resolved without intervention  (9) starting PVD 10/25/2015 w ASA 325 thromboprophylaxis, valacyclovir prophylaxis, last dose 12/17/2015  (a) pomalidomide 4 mg/d days 1-14  (Daviel) bortezomib sQ days 2,5,9,12 of each 21 day cycle  (c) dexamethasome 20 mg two days a week  (d) dexamethasone bortezomib and pomalidomide discontinued late December 2018 with poor tolerance  (10) metapneumovirus pneumonia April  2017  (a) completing course of steroids and week of bactrim mid April 2017  (11) status post second autologous transplant at [facility] 02/04/2016(preparatory regimen melphalan 200 mg/m)  (a) received twelve-month vaccinations 03/14/2017 (DPT, Haemophilus, Pneumovax 13, polio)  (Kavion) 14 months injections 05/04/2016 include DTaP, Hib conjugate, HepB energex Keldric 20 mcg/ml, Prevnar 13  (c) 24 month vaccines due at [facility] June 2019  (12) maintenance therapy started November 2017, consisting of  (a) bortezomib 1.3 mg/M2 every 14 days, first dose 07/27/2016  ([place]) pomalidomide 1 mg days 1-21 Q28 days, started 07/19/2016  (c) zolendronate monthly started 07/27/2016 (previously Q 3 months) however patient unable to tolerate, and changed back to q3 month in April, 2018   (d) Bortezomib changed to monthly as of June 2018 because of tolerance issues, however discontinued after 09/14/2017 dose because of a rise in his M spike  (e) pomalidomide held after 10/18/2017 in preparation for possible study at [facility] (venetoclax)--never resumed  (  f) with numbers actually improved off treatment, resumed every 2-week bortezomib 12/25/2017  PLAN:  Andre [name] is now 2-1/2 years out from his second transplant, with very stable disease.  He generally tolerates the bortezomib well, although it is making him somewhat tired.  We are going to move his treatments to Monday's so that he does not get entangled with problems over the holidays, which are Wednesday and Thursday this year.  Neuropathy has not progressed.  He is doing much better as far as his functional status is concerned since he went off the pomalidomide.  He was agitating to move the Velcade to every 3 weeks, but I am concerned that we might get disease progression, then have to intensify therapy, and he might end up with more side effects rather than fewer.  At this point I am very pleased at how well he is doing.  We are going to continue treatment until there is definite  evidence of disease progression.    I have encouraged him not to run but to continue to walk regularly.  He knows to call us for any problems that may develop before the next visit here.   Haitham Dolinsky, [name], MD  07/24/18 11:14 AM Medical Oncology and Hematology [facility] [address] [place], [place] [address] Tel. [phone]    Fax. [phone]    I, Soijett Blue am acting as scribe for Dr. Sarajane Jews C. Larenz Frasier.  I, Lurline Del MD, have reviewed the above documentation for accuracy and completeness, and I agree with the above.

## 2018-07-24 NOTE — Patient Instructions (Signed)
[facility] Discharge Instructions for Patients Receiving Chemotherapy  Today you received the following chemotherapy agents: Bortezomib (Velcade)  To help prevent nausea and vomiting after your treatment, we encourage you to take your nausea medication as directed.    If you develop nausea and vomiting that is not controlled by your nausea medication, call the clinic.   BELOW ARE SYMPTOMS THAT SHOULD BE REPORTED IMMEDIATELY:  *FEVER GREATER THAN 100.5 F  *CHILLS WITH OR WITHOUT FEVER  NAUSEA AND VOMITING THAT IS NOT CONTROLLED WITH YOUR NAUSEA MEDICATION  *UNUSUAL SHORTNESS OF BREATH  *UNUSUAL BRUISING OR BLEEDING  TENDERNESS IN MOUTH AND THROAT WITH OR WITHOUT PRESENCE OF ULCERS  *URINARY PROBLEMS  *BOWEL PROBLEMS  UNUSUAL RASH Items with * indicate a potential emergency and should be followed up as soon as possible.  Feel free to call the clinic should you have any questions or concerns. The clinic phone number is [phone].  Please show the CHEMO ALERT CARD at check-in to the Emergency Department and triage nurse.   

## 2018-07-24 NOTE — Telephone Encounter (Signed)
Gave pt avs and calendar  °

## 2018-07-25 ENCOUNTER — Telehealth: Payer: Self-pay | Admitting: *Deleted

## 2018-07-25 LAB — KAPPA/LAMBDA LIGHT CHAINS
KAPPA FREE LGHT CHN: 29.1 mg/L — AB (ref 3.3–19.4)
Kappa, lambda light chain ratio: 1.97 — ABNORMAL HIGH (ref 0.26–1.65)
LAMDA FREE LIGHT CHAINS: 14.8 mg/L (ref 5.7–26.3)

## 2018-07-25 NOTE — Telephone Encounter (Signed)
TC from patient inquiring about some prescriptions-he wants to make sure they have been sent to [facility]. Confirmed qwith pt that Valtrex and protonix were sent to [facility].

## 2018-07-29 LAB — MULTIPLE MYELOMA PANEL, SERUM
ALBUMIN/GLOB SERPL: 1.1 (ref 0.7–1.7)
ALPHA2 GLOB SERPL ELPH-MCNC: 0.7 g/dL (ref 0.4–1.0)
Albumin SerPl Elph-Mcnc: 3.7 g/dL (ref 2.9–4.4)
Alpha 1: 0.2 g/dL (ref 0.0–0.4)
B-GLOBULIN SERPL ELPH-MCNC: 1 g/dL (ref 0.7–1.3)
GAMMA GLOB SERPL ELPH-MCNC: 1.4 g/dL (ref 0.4–1.8)
GLOBULIN, TOTAL: 3.4 g/dL (ref 2.2–3.9)
IGG (IMMUNOGLOBIN G), SERUM: 1478 mg/dL (ref 700–1600)
IgA: 130 mg/dL (ref 61–437)
IgM (Immunoglobulin M), Srm: 79 mg/dL (ref 20–172)
M PROTEIN SERPL ELPH-MCNC: 0.3 g/dL — AB
Total Protein ELP: 7.1 g/dL (ref 6.0–8.5)

## 2018-08-05 ENCOUNTER — Inpatient Hospital Stay: Payer: 59

## 2018-08-05 VITALS — BP 111/76 | HR 89 | Temp 98.0°F | Resp 18 | Wt 182.2 lb

## 2018-08-05 DIAGNOSIS — C9000 Multiple myeloma not having achieved remission: Secondary | ICD-10-CM

## 2018-08-05 DIAGNOSIS — Z9481 Bone marrow transplant status: Secondary | ICD-10-CM

## 2018-08-05 DIAGNOSIS — Z5112 Encounter for antineoplastic immunotherapy: Secondary | ICD-10-CM | POA: Diagnosis not present

## 2018-08-05 DIAGNOSIS — E039 Hypothyroidism, unspecified: Secondary | ICD-10-CM | POA: Diagnosis not present

## 2018-08-05 LAB — CBC WITH DIFFERENTIAL (CANCER CENTER ONLY)
ABS IMMATURE GRANULOCYTES: 0.01 10*3/uL (ref 0.00–0.07)
BASOS PCT: 0 %
Basophils Absolute: 0 10*3/uL (ref 0.0–0.1)
Eosinophils Absolute: 0.1 10*3/uL (ref 0.0–0.5)
Eosinophils Relative: 2 %
HCT: 38.1 % — ABNORMAL LOW (ref 39.0–52.0)
HEMOGLOBIN: 13.1 g/dL (ref 13.0–17.0)
IMMATURE GRANULOCYTES: 0 %
LYMPHS PCT: 25 %
Lymphs Abs: 1.4 10*3/uL (ref 0.7–4.0)
MCH: 35.8 pg — ABNORMAL HIGH (ref 26.0–34.0)
MCHC: 34.4 g/dL (ref 30.0–36.0)
MCV: 104.1 fL — AB (ref 80.0–100.0)
MONOS PCT: 12 %
Monocytes Absolute: 0.6 10*3/uL (ref 0.1–1.0)
NEUTROS ABS: 3.4 10*3/uL (ref 1.7–7.7)
NEUTROS PCT: 61 %
Platelet Count: 106 10*3/uL — ABNORMAL LOW (ref 150–400)
RBC: 3.66 MIL/uL — ABNORMAL LOW (ref 4.22–5.81)
RDW: 14 % (ref 11.5–15.5)
WBC Count: 5.5 10*3/uL (ref 4.0–10.5)
nRBC: 0 % (ref 0.0–0.2)

## 2018-08-05 LAB — CMP (CANCER CENTER ONLY)
ALBUMIN: 3.7 g/dL (ref 3.5–5.0)
ALT: 19 U/L (ref 0–44)
AST: 27 U/L (ref 15–41)
Alkaline Phosphatase: 78 U/L (ref 38–126)
Anion gap: 8 (ref 5–15)
BILIRUBIN TOTAL: 0.3 mg/dL (ref 0.3–1.2)
BUN: 27 mg/dL — ABNORMAL HIGH (ref 8–23)
CHLORIDE: 107 mmol/L (ref 98–111)
CO2: 23 mmol/L (ref 22–32)
Calcium: 9.7 mg/dL (ref 8.9–10.3)
Creatinine: 1.07 mg/dL (ref 0.61–1.24)
GFR, Est AFR Am: >60 mL/min (ref >60)
GFR, Estimated: >60 mL/min (ref >60)
Glucose, Bld: 106 mg/dL — ABNORMAL HIGH (ref 70–99)
POTASSIUM: 4 mmol/L (ref 3.5–5.1)
SODIUM: 138 mmol/L (ref 135–145)
TOTAL PROTEIN: 7.2 g/dL (ref 6.5–8.1)

## 2018-08-05 MED ORDER — BORTEZOMIB CHEMO SQ INJECTION 3.5 MG (2.5MG/ML)
1.3000 mg/m2 | Freq: Once | INTRAMUSCULAR | Status: AC
  Administered 2018-08-05: 2.5 mg via SUBCUTANEOUS
  Filled 2018-08-05: qty 1

## 2018-08-05 MED ORDER — PROCHLORPERAZINE MALEATE 10 MG PO TABS: 10.0000 mg | ORAL_TABLET | Freq: Once | ORAL | Status: DC

## 2018-08-05 NOTE — Patient Instructions (Signed)
[facility] Discharge Instructions for Patients Receiving Chemotherapy  Today you received the following chemotherapy agents: Bortezomib (Velcade)  To help prevent nausea and vomiting after your treatment, we encourage you to take your nausea medication as directed.    If you develop nausea and vomiting that is not controlled by your nausea medication, call the clinic.   BELOW ARE SYMPTOMS THAT SHOULD BE REPORTED IMMEDIATELY:  *FEVER GREATER THAN 100.5 F  *CHILLS WITH OR WITHOUT FEVER  NAUSEA AND VOMITING THAT IS NOT CONTROLLED WITH YOUR NAUSEA MEDICATION  *UNUSUAL SHORTNESS OF BREATH  *UNUSUAL BRUISING OR BLEEDING  TENDERNESS IN MOUTH AND THROAT WITH OR WITHOUT PRESENCE OF ULCERS  *URINARY PROBLEMS  *BOWEL PROBLEMS  UNUSUAL RASH Items with * indicate a potential emergency and should be followed up as soon as possible.  Feel free to call the clinic should you have any questions or concerns. The clinic phone number is [phone].  Please show the CHEMO ALERT CARD at check-in to the Emergency Department and triage nurse.   

## 2018-08-06 ENCOUNTER — Other Ambulatory Visit: Payer: Self-pay | Admitting: Oncology

## 2018-08-06 LAB — KAPPA/LAMBDA LIGHT CHAINS
Kappa free light chain: 27.1 mg/L — ABNORMAL HIGH (ref 3.3–19.4)
Kappa, lambda light chain ratio: 1.81 — ABNORMAL HIGH (ref 0.26–1.65)
Lambda free light chains: 15 mg/L (ref 5.7–26.3)

## 2018-08-07 ENCOUNTER — Other Ambulatory Visit: Payer: Self-pay | Admitting: *Deleted

## 2018-08-07 ENCOUNTER — Other Ambulatory Visit: Payer: Self-pay | Admitting: Oncology

## 2018-08-07 ENCOUNTER — Other Ambulatory Visit: Payer: 59

## 2018-08-07 ENCOUNTER — Ambulatory Visit: Payer: 59

## 2018-08-07 ENCOUNTER — Encounter: Payer: Self-pay | Admitting: Oncology

## 2018-08-07 DIAGNOSIS — C9000 Multiple myeloma not having achieved remission: Secondary | ICD-10-CM

## 2018-08-07 LAB — MULTIPLE MYELOMA PANEL, SERUM
ALBUMIN/GLOB SERPL: 1.1 (ref 0.7–1.7)
ALPHA 1: 0.2 g/dL (ref 0.0–0.4)
Albumin SerPl Elph-Mcnc: 3.5 g/dL (ref 2.9–4.4)
Alpha2 Glob SerPl Elph-Mcnc: 0.7 g/dL (ref 0.4–1.0)
B-Globulin SerPl Elph-Mcnc: 1 g/dL (ref 0.7–1.3)
GAMMA GLOB SERPL ELPH-MCNC: 1.3 g/dL (ref 0.4–1.8)
GLOBULIN, TOTAL: 3.3 g/dL (ref 2.2–3.9)
IGA: 119 mg/dL (ref 61–437)
IgG (Immunoglobin G), Serum: 1392 mg/dL (ref 700–1600)
IgM (Immunoglobulin M), Srm: 68 mg/dL (ref 20–172)
M Protein SerPl Elph-Mcnc: 0.3 g/dL — ABNORMAL HIGH
Total Protein ELP: 6.8 g/dL (ref 6.0–8.5)

## 2018-08-07 MED ORDER — ONDANSETRON HCL 8 MG PO TABS: ORAL_TABLET | ORAL | 0 refills | Status: DC

## 2018-08-19 ENCOUNTER — Inpatient Hospital Stay: Payer: 59

## 2018-08-19 ENCOUNTER — Encounter: Payer: Self-pay | Admitting: Internal Medicine

## 2018-08-19 ENCOUNTER — Inpatient Hospital Stay: Payer: 59 | Attending: Oncology

## 2018-08-19 ENCOUNTER — Ambulatory Visit (INDEPENDENT_AMBULATORY_CARE_PROVIDER_SITE_OTHER): Payer: 59 | Admitting: Internal Medicine

## 2018-08-19 VITALS — HR 85

## 2018-08-19 DIAGNOSIS — C9000 Multiple myeloma not having achieved remission: Secondary | ICD-10-CM | POA: Diagnosis not present

## 2018-08-19 DIAGNOSIS — Z9481 Bone marrow transplant status: Secondary | ICD-10-CM

## 2018-08-19 DIAGNOSIS — Z0000 Encounter for general adult medical examination without abnormal findings: Secondary | ICD-10-CM

## 2018-08-19 DIAGNOSIS — Z5112 Encounter for antineoplastic immunotherapy: Secondary | ICD-10-CM | POA: Insufficient documentation

## 2018-08-19 LAB — CMP (CANCER CENTER ONLY)
ALK PHOS: 76 U/L (ref 38–126)
ALT: 20 U/L (ref 0–44)
ANION GAP: 9 (ref 5–15)
AST: 24 U/L (ref 15–41)
Albumin: 3.7 g/dL (ref 3.5–5.0)
BUN: 35 mg/dL — ABNORMAL HIGH (ref 8–23)
CALCIUM: 9.7 mg/dL (ref 8.9–10.3)
CO2: 24 mmol/L (ref 22–32)
CREATININE: 1.15 mg/dL (ref 0.61–1.24)
Chloride: 106 mmol/L (ref 98–111)
Glucose, Bld: 93 mg/dL (ref 70–99)
Potassium: 4.6 mmol/L (ref 3.5–5.1)
SODIUM: 139 mmol/L (ref 135–145)
Total Bilirubin: 0.3 mg/dL (ref 0.3–1.2)
Total Protein: 7.2 g/dL (ref 6.5–8.1)

## 2018-08-19 LAB — CBC WITH DIFFERENTIAL (CANCER CENTER ONLY)
Abs Immature Granulocytes: 0 10*3/uL (ref 0.00–0.07)
BASOS ABS: 0 10*3/uL (ref 0.0–0.1)
Basophils Relative: 0 %
EOS ABS: 0.1 10*3/uL (ref 0.0–0.5)
EOS PCT: 2 %
HEMATOCRIT: 37.8 % — AB (ref 39.0–52.0)
Hemoglobin: 13 g/dL (ref 13.0–17.0)
Immature Granulocytes: 0 %
LYMPHS ABS: 1.5 10*3/uL (ref 0.7–4.0)
LYMPHS PCT: 25 %
MCH: 35.5 pg — AB (ref 26.0–34.0)
MCHC: 34.4 g/dL (ref 30.0–36.0)
MCV: 103.3 fL — AB (ref 80.0–100.0)
MONOS PCT: 18 %
Monocytes Absolute: 1 10*3/uL (ref 0.1–1.0)
NRBC: 0 % (ref 0.0–0.2)
Neutro Abs: 3.2 10*3/uL (ref 1.7–7.7)
Neutrophils Relative %: 55 %
PLATELETS: 108 10*3/uL — AB (ref 150–400)
RBC: 3.66 MIL/uL — ABNORMAL LOW (ref 4.22–5.81)
RDW: 13.6 % (ref 11.5–15.5)
WBC Count: 5.8 10*3/uL (ref 4.0–10.5)

## 2018-08-19 MED ORDER — PROCHLORPERAZINE MALEATE 10 MG PO TABS: 10.0000 mg | ORAL_TABLET | Freq: Once | ORAL | Status: DC

## 2018-08-19 MED ORDER — BORTEZOMIB CHEMO SQ INJECTION 3.5 MG (2.5MG/ML)
1.3000 mg/m2 | Freq: Once | INTRAMUSCULAR | Status: AC
  Administered 2018-08-19: 2.5 mg via SUBCUTANEOUS
  Filled 2018-08-19: qty 1

## 2018-08-19 NOTE — Patient Instructions (Signed)
[facility] Discharge Instructions for Patients Receiving Chemotherapy  Today you received the following chemotherapy agents Bortezomib (VELCADE).  To help prevent nausea and vomiting after your treatment, we encourage you to take your nausea medication as prescribed.   If you develop nausea and vomiting that is not controlled by your nausea medication, call the clinic.   BELOW ARE SYMPTOMS THAT SHOULD BE REPORTED IMMEDIATELY:  *FEVER GREATER THAN 100.5 F  *CHILLS WITH OR WITHOUT FEVER  NAUSEA AND VOMITING THAT IS NOT CONTROLLED WITH YOUR NAUSEA MEDICATION  *UNUSUAL SHORTNESS OF BREATH  *UNUSUAL BRUISING OR BLEEDING  TENDERNESS IN MOUTH AND THROAT WITH OR WITHOUT PRESENCE OF ULCERS  *URINARY PROBLEMS  *BOWEL PROBLEMS  UNUSUAL RASH Items with * indicate a potential emergency and should be followed up as soon as possible.  Feel free to call the clinic should you have any questions or concerns. The clinic phone number is [phone].  Please show the CHEMO ALERT CARD at check-in to the Emergency Department and triage nurse.   

## 2018-08-19 NOTE — Assessment & Plan Note (Addendum)
Here for wellness/physical  Diet: heart healthy  Physical activity: not sedentary  Depression/mood screen: negative  Hearing: intact to whispered voice  Visual acuity: grossly normal w/readers, performs annual eye exam  ADLs: capable  Fall risk: low to none  Home safety: good  Cognitive evaluation: intact to orientation, naming, recall and repetition  EOL planning: adv directives, full code/ I agree  I have personally reviewed and have noted  1. [name] medical, surgical and social history  2. Their use of alcohol, tobacco or illicit drugs  3. Their current medications and supplements  4. [name] functional ability including ADL's, fall risks, home safety risks and hearing or visual impairment.  5. Diet and physical activities  6. Evidence for depression or mood disorders 7. The roster of all physicians providing medical care to patient - is listed in the Snapshot section of the chart and reviewed today.    Today patient counseled on age appropriate routine health concerns for screening and prevention, each reviewed and up to date or declined. Immunizations reviewed and up to date or declined. Labs ordered and reviewed. Risk factors for depression reviewed and negative. Hearing function and visual acuity are intact. ADLs screened and addressed as needed. Functional ability and level of safety reviewed and appropriate. Education, counseling and referrals performed based on assessed risks today. Patient provided with a copy of personalized plan for preventive services.

## 2018-08-19 NOTE — Progress Notes (Addendum)
Subjective:  Patient ID: Andre [name], male    DOB: 1950/11/17  Age: 67 y.o. MRN: [id number]  CC: No chief complaint on file.   HPI Andre [name] presents for a well exam. On chemo q 2 wks  Outpatient Medications Prior to Visit  Medication Sig Dispense Refill  . [place] complex vitamins tablet Take 1 tablet by mouth daily. 100 tablet 3  . cetirizine (ZYRTEC) 10 MG tablet Take 10 mg by mouth daily.    . Cholecalciferol (VITAMIN D3) 2000 units capsule Take 1 capsule (2,000 Units total) by mouth daily. 100 capsule 3  . docusate sodium (COLACE) 100 MG capsule Take 100 mg by mouth daily as needed for mild constipation or moderate constipation.    [name] escitalopram (LEXAPRO) 10 MG tablet TAKE 1 TABLET BY MOUTH  DAILY 90 tablet 3  . feeding supplement, ENSURE ENLIVE, (ENSURE ENLIVE) LIQD Take 237 mLs by mouth 2 (two) times daily between meals. 237 mL 12  . HYDROcodone-acetaminophen (NORCO) 5-325 MG tablet 1/2 to 2 q 6 hours 30 tablet 0  . hydrOXYzine (ATARAX/VISTARIL) 25 MG tablet Take 1 tablet (25 mg total) by mouth 3 (three) times daily as needed. 30 tablet 0  . levothyroxine (SYNTHROID, LEVOTHROID) 150 MCG tablet TAKE 1 TABLET BY MOUTH  EVERY MORNING ON AN EMPTY  STOMACH 90 tablet 3  . ondansetron (ZOFRAN) 8 MG tablet TAKE 1 TABLET BY MOUTH EVERY 8 HOURS AS NEEDED FOR NAUSEA AND VOMITING 20 tablet 0  . pantoprazole (PROTONIX) 40 MG tablet TAKE 1 TABLET BY MOUTH  TWICE A DAY BEFORE MEALS 180 tablet 3  . ranitidine (ZANTAC) 150 MG tablet Take 1 tablet (150 mg total) by mouth 2 (two) times daily. 90 tablet 3  . sildenafil (VIAGRA) 25 MG tablet Take 1 tablet (25 mg total) by mouth daily as needed for erectile dysfunction. 10 tablet 0  . triamcinolone cream (KENALOG) 0.5 % Apply 1 application topically 4 (four) times daily. 60 g 2  . valACYclovir (VALTREX) 1000 MG tablet TAKE 1 TABLET BY MOUTH  DAILY 90 tablet 3  . zolendronic acid (ZOMETA) 4 MG/5ML injection Inject 4 mg into the vein every 3 (three)  months.    . ondansetron (ZOFRAN) 8 MG tablet TAKE 1 TABLET BY MOUTH EVERY 8 HOURS AS NEEDED FOR NAUSEA AND VOMITING 20 tablet 0  . valACYclovir (VALTREX) 1000 MG tablet Take 1 tablet (1,000 mg total) by mouth daily. 90 tablet 3   No facility-administered medications prior to visit.     ROS: Review of Systems  Constitutional: Positive for fatigue. Negative for appetite change and unexpected weight change.  HENT: Negative for congestion, nosebleeds, sneezing, sore throat and trouble swallowing.   Eyes: Negative for itching and visual disturbance.  Respiratory: Negative for cough.   Cardiovascular: Negative for chest pain, palpitations and leg swelling.  Gastrointestinal: Negative for abdominal distention, blood in stool, diarrhea and nausea.  Genitourinary: Negative for frequency and hematuria.  Musculoskeletal: Positive for arthralgias. Negative for back pain, gait problem, joint swelling and neck pain.  Skin: Negative for rash.  Neurological: Positive for weakness. Negative for dizziness, tremors and speech difficulty.  Psychiatric/Behavioral: Positive for sleep disturbance. Negative for agitation, dysphoric mood and suicidal ideas. The patient is not nervous/anxious.     Objective:  BP 114/74 (BP Location: Left Arm, Patient Position: Sitting, Cuff Size: Normal)   Pulse (!) 57   Temp 97.6 F (36.4 C) (Oral)   Ht 5' 9"  (1.753 m)   Wt  183 lb (83 kg)   SpO2 97%   BMI 27.02 kg/m   BP Readings from Last 3 Encounters:  08/19/18 114/74  08/05/18 111/76  07/24/18 121/67    Wt Readings from Last 3 Encounters:  08/19/18 183 lb (83 kg)  08/05/18 182 lb 4 oz (82.7 kg)  07/24/18 181 lb 12.8 oz (82.5 kg)    Physical Exam  Constitutional: He is oriented to person, place, and time. He appears well-developed. No distress.  NAD  HENT:  Mouth/Throat: Oropharynx is clear and moist.  Eyes: Pupils are equal, round, and reactive to light. Conjunctivae are normal.  Neck: Normal range of  motion. No JVD present. No thyromegaly present.  Cardiovascular: Normal rate, regular rhythm, normal heart sounds and intact distal pulses. Exam reveals no gallop and no friction rub.  No murmur heard. Pulmonary/Chest: Effort normal and breath sounds normal. No respiratory distress. He has no wheezes. He has no rales. He exhibits no tenderness.  Abdominal: Soft. Bowel sounds are normal. He exhibits no distension and no mass. There is no tenderness. There is no rebound and no guarding.  Musculoskeletal: Normal range of motion. He exhibits no edema or tenderness.  Lymphadenopathy:    He has no cervical adenopathy.  Neurological: He is alert and oriented to person, place, and time. He has normal reflexes. No cranial nerve deficit. He exhibits normal muscle tone. He displays a negative Romberg sign. Coordination and gait normal.  Skin: Skin is warm and dry. No rash noted.  Psychiatric: He has a normal mood and affect. His behavior is normal. Judgment and thought content normal.   R great toe and 2nd toe w/nail deformities   Lab Results  Component Value Date   WBC 5.5 08/05/2018   HGB 13.1 08/05/2018   HCT 38.1 (L) 08/05/2018   PLT 106 (L) 08/05/2018   GLUCOSE 106 (H) 08/05/2018   CHOL 164 09/06/2017   TRIG 194.0 (H) 09/06/2017   HDL 38.60 (L) 09/06/2017   LDLDIRECT 155.8 11/13/2012   LDLCALC 86 09/06/2017   ALT 19 08/05/2018   AST 27 08/05/2018   NA 138 08/05/2018   K 4.0 08/05/2018   CL 107 08/05/2018   CREATININE 1.07 08/05/2018   BUN 27 (H) 08/05/2018   CO2 23 08/05/2018   TSH 0.05 (L) 09/06/2017   PSA 1.76 09/06/2017   INR 1.07 02/27/2016   HGBA1C 5.4 03/27/2012    Dg Chest 2 View  Result Date: 05/23/2018 CLINICAL DATA:  Rib pain post fall. Known multiple myeloma. Evaluate for fracture. EXAM: CHEST - 2 VIEW COMPARISON:  Chest x-ray dated 11/20/2016. FINDINGS: Heart size and mediastinal contours are within normal limits. Lungs are clear. No pleural effusion or pneumothorax  seen. Slightly displaced fracture of the posterior RIGHT fifth rib. Probable nondisplaced fracture of the posterior RIGHT fourth rib. IMPRESSION: 1. Slightly displaced fracture of the posterior RIGHT fifth rib. 2. Probable nondisplaced fracture of the posterior RIGHT fourth rib. 3. Lungs are clear.  No pleural effusion or pneumothorax seen. Electronically Signed   By: Franki Cabot M.D.   On: 05/23/2018 12:14   Ct Head Wo Contrast  Result Date: 05/23/2018 CLINICAL DATA:  Pain following fall EXAM: CT HEAD WITHOUT CONTRAST TECHNIQUE: Contiguous axial images were obtained from the base of the skull through the vertex without intravenous contrast. COMPARISON:  May 29, 2015 FINDINGS: Brain: There is mild diffuse atrophy. There is no intracranial mass, hemorrhage, extra-axial fluid collection, or midline shift. There is evidence of a prior infarct in  the posterior mid left cerebellum, stable. A more subtle prior infarct in the left lentiform nucleus is stable. There is relatively mild periventricular small vessel disease in the centra semiovale bilaterally. No acute appearing infarct evident. Vascular: No hyperdense vessel. There is calcification in each carotid siphon region. Skull: There are lytic lesions throughout the calvarium consistent with multiple myeloma, a finding that has been noted previously. Sinuses/Orbits: There is opacification in several ethmoid air cells. Other visualized paranasal sinuses are clear. Orbits appear symmetric bilaterally. Other: Mastoid air cells are clear. IMPRESSION: 1. Widespread lytic calvarial lesions consistent with known multiple myeloma, stable. 2. Atrophy with periventricular small vessel disease. Prior small infarcts, most notably in the posterior mid left cerebellum, stable. No acute infarct. No mass or hemorrhage. 3.  There are foci of arterial vascular calcification. Electronically Signed   By: Lowella Grip III M.D.   On: 05/23/2018 15:51    Assessment &  Plan:   There are no diagnoses linked to this encounter.   No orders of the defined types were placed in this encounter.    Follow-up: No follow-ups on file.  [name], MD

## 2018-08-20 ENCOUNTER — Ambulatory Visit: Payer: 59

## 2018-08-20 ENCOUNTER — Other Ambulatory Visit: Payer: 59

## 2018-08-20 LAB — KAPPA/LAMBDA LIGHT CHAINS
Kappa free light chain: 30.1 mg/L — ABNORMAL HIGH (ref 3.3–19.4)
Kappa, lambda light chain ratio: 1.8 — ABNORMAL HIGH (ref 0.26–1.65)
Lambda free light chains: 16.7 mg/L (ref 5.7–26.3)

## 2018-08-20 LAB — PROTEIN ELECTROPHORESIS, SERUM
A/G Ratio: 1.1 (ref 0.7–1.7)
Albumin ELP: 3.4 g/dL (ref 2.9–4.4)
Alpha-1-Globulin: 0.2 g/dL (ref 0.0–0.4)
Alpha-2-Globulin: 0.7 g/dL (ref 0.4–1.0)
BETA GLOBULIN: 1 g/dL (ref 0.7–1.3)
GAMMA GLOBULIN: 1.3 g/dL (ref 0.4–1.8)
Globulin, Total: 3.2 g/dL (ref 2.2–3.9)
M-SPIKE, %: 0.4 g/dL — AB
Total Protein ELP: 6.6 g/dL (ref 6.0–8.5)

## 2018-08-21 ENCOUNTER — Ambulatory Visit: Payer: 59

## 2018-08-21 ENCOUNTER — Other Ambulatory Visit: Payer: 59

## 2018-08-25 NOTE — Addendum Note (Signed)
Addended by: Cassandria Anger on: 08/25/2018 10:58 PM   Modules accepted: Level of Service

## 2018-09-02 ENCOUNTER — Inpatient Hospital Stay: Payer: 59

## 2018-09-02 VITALS — BP 110/73 | HR 79 | Temp 97.9°F | Resp 18

## 2018-09-02 DIAGNOSIS — Z5112 Encounter for antineoplastic immunotherapy: Secondary | ICD-10-CM | POA: Diagnosis not present

## 2018-09-02 DIAGNOSIS — C9000 Multiple myeloma not having achieved remission: Secondary | ICD-10-CM

## 2018-09-02 DIAGNOSIS — Z9481 Bone marrow transplant status: Secondary | ICD-10-CM

## 2018-09-02 LAB — CMP (CANCER CENTER ONLY)
ALT: 24 U/L (ref 0–44)
AST: 26 U/L (ref 15–41)
Albumin: 3.7 g/dL (ref 3.5–5.0)
Alkaline Phosphatase: 77 U/L (ref 38–126)
Anion gap: 11 (ref 5–15)
BUN: 32 mg/dL — ABNORMAL HIGH (ref 8–23)
CO2: 25 mmol/L (ref 22–32)
CREATININE: 1.15 mg/dL (ref 0.61–1.24)
Calcium: 9.7 mg/dL (ref 8.9–10.3)
Chloride: 103 mmol/L (ref 98–111)
GFR, Est AFR Am: >60 mL/min (ref >60)
GFR, Estimated: >60 mL/min (ref >60)
Glucose, Bld: 106 mg/dL — ABNORMAL HIGH (ref 70–99)
Potassium: 4.3 mmol/L (ref 3.5–5.1)
Sodium: 139 mmol/L (ref 135–145)
Total Bilirubin: 0.3 mg/dL (ref 0.3–1.2)
Total Protein: 7.3 g/dL (ref 6.5–8.1)

## 2018-09-02 LAB — CBC WITH DIFFERENTIAL (CANCER CENTER ONLY)
Abs Immature Granulocytes: 0.01 10*3/uL (ref 0.00–0.07)
Basophils Absolute: 0 10*3/uL (ref 0.0–0.1)
Basophils Relative: 0 %
Eosinophils Absolute: 0.2 10*3/uL (ref 0.0–0.5)
Eosinophils Relative: 3 %
HCT: 38.1 % — ABNORMAL LOW (ref 39.0–52.0)
Hemoglobin: 13 g/dL (ref 13.0–17.0)
Immature Granulocytes: 0 %
Lymphocytes Relative: 25 %
Lymphs Abs: 1.4 10*3/uL (ref 0.7–4.0)
MCH: 35.8 pg — ABNORMAL HIGH (ref 26.0–34.0)
MCHC: 34.1 g/dL (ref 30.0–36.0)
MCV: 105 fL — ABNORMAL HIGH (ref 80.0–100.0)
Monocytes Absolute: 0.8 10*3/uL (ref 0.1–1.0)
Monocytes Relative: 15 %
Neutro Abs: 3.2 10*3/uL (ref 1.7–7.7)
Neutrophils Relative %: 57 %
Platelet Count: 104 10*3/uL — ABNORMAL LOW (ref 150–400)
RBC: 3.63 MIL/uL — ABNORMAL LOW (ref 4.22–5.81)
RDW: 14 % (ref 11.5–15.5)
WBC Count: 5.6 10*3/uL (ref 4.0–10.5)
nRBC: 0 % (ref 0.0–0.2)

## 2018-09-02 MED ORDER — BORTEZOMIB CHEMO SQ INJECTION 3.5 MG (2.5MG/ML)
1.3000 mg/m2 | Freq: Once | INTRAMUSCULAR | Status: AC
  Administered 2018-09-02: 2.5 mg via SUBCUTANEOUS
  Filled 2018-09-02: qty 1

## 2018-09-02 MED ORDER — PROCHLORPERAZINE MALEATE 10 MG PO TABS: 10.0000 mg | ORAL_TABLET | Freq: Once | ORAL | Status: DC

## 2018-09-02 NOTE — Patient Instructions (Signed)
[facility] Discharge Instructions for Patients Receiving Chemotherapy  Today you received the following chemotherapy agents Bortezomib (VELCADE).  To help prevent nausea and vomiting after your treatment, we encourage you to take your nausea medication as prescribed.   If you develop nausea and vomiting that is not controlled by your nausea medication, call the clinic.   BELOW ARE SYMPTOMS THAT SHOULD BE REPORTED IMMEDIATELY:  *FEVER GREATER THAN 100.5 F  *CHILLS WITH OR WITHOUT FEVER  NAUSEA AND VOMITING THAT IS NOT CONTROLLED WITH YOUR NAUSEA MEDICATION  *UNUSUAL SHORTNESS OF BREATH  *UNUSUAL BRUISING OR BLEEDING  TENDERNESS IN MOUTH AND THROAT WITH OR WITHOUT PRESENCE OF ULCERS  *URINARY PROBLEMS  *BOWEL PROBLEMS  UNUSUAL RASH Items with * indicate a potential emergency and should be followed up as soon as possible.  Feel free to call the clinic should you have any questions or concerns. The clinic phone number is [phone].  Please show the CHEMO ALERT CARD at check-in to the Emergency Department and triage nurse.   

## 2018-09-16 ENCOUNTER — Other Ambulatory Visit: Payer: Self-pay

## 2018-09-16 ENCOUNTER — Inpatient Hospital Stay: Payer: 59

## 2018-09-16 VITALS — BP 111/72 | HR 97 | Temp 98.1°F | Resp 17

## 2018-09-16 DIAGNOSIS — C9000 Multiple myeloma not having achieved remission: Secondary | ICD-10-CM

## 2018-09-16 DIAGNOSIS — C9002 Multiple myeloma in relapse: Secondary | ICD-10-CM

## 2018-09-16 DIAGNOSIS — Z5112 Encounter for antineoplastic immunotherapy: Secondary | ICD-10-CM | POA: Diagnosis not present

## 2018-09-16 DIAGNOSIS — Z9481 Bone marrow transplant status: Secondary | ICD-10-CM

## 2018-09-16 LAB — CBC WITH DIFFERENTIAL (CANCER CENTER ONLY)
ABS IMMATURE GRANULOCYTES: 0.01 10*3/uL (ref 0.00–0.07)
Basophils Absolute: 0 10*3/uL (ref 0.0–0.1)
Basophils Relative: 0 %
Eosinophils Absolute: 0.1 10*3/uL (ref 0.0–0.5)
Eosinophils Relative: 2 %
HCT: 38.7 % — ABNORMAL LOW (ref 39.0–52.0)
Hemoglobin: 13.5 g/dL (ref 13.0–17.0)
IMMATURE GRANULOCYTES: 0 %
Lymphocytes Relative: 25 %
Lymphs Abs: 1.6 10*3/uL (ref 0.7–4.0)
MCH: 36 pg — ABNORMAL HIGH (ref 26.0–34.0)
MCHC: 34.9 g/dL (ref 30.0–36.0)
MCV: 103.2 fL — ABNORMAL HIGH (ref 80.0–100.0)
Monocytes Absolute: 0.9 10*3/uL (ref 0.1–1.0)
Monocytes Relative: 15 %
NEUTROS PCT: 58 %
Neutro Abs: 3.8 10*3/uL (ref 1.7–7.7)
Platelet Count: 115 10*3/uL — ABNORMAL LOW (ref 150–400)
RBC: 3.75 MIL/uL — ABNORMAL LOW (ref 4.22–5.81)
RDW: 13.6 % (ref 11.5–15.5)
WBC Count: 6.5 10*3/uL (ref 4.0–10.5)
nRBC: 0 % (ref 0.0–0.2)

## 2018-09-16 LAB — CMP (CANCER CENTER ONLY)
ALT: 24 U/L (ref 0–44)
AST: 27 U/L (ref 15–41)
Albumin: 3.8 g/dL (ref 3.5–5.0)
Alkaline Phosphatase: 82 U/L (ref 38–126)
Anion gap: 10 (ref 5–15)
BUN: 34 mg/dL — ABNORMAL HIGH (ref 8–23)
CO2: 23 mmol/L (ref 22–32)
Calcium: 10 mg/dL (ref 8.9–10.3)
Chloride: 103 mmol/L (ref 98–111)
Creatinine: 1.19 mg/dL (ref 0.61–1.24)
GFR, Est AFR Am: >60 mL/min (ref >60)
GFR, Estimated: >60 mL/min (ref >60)
Glucose, Bld: 94 mg/dL (ref 70–99)
POTASSIUM: 4.5 mmol/L (ref 3.5–5.1)
Sodium: 136 mmol/L (ref 135–145)
Total Bilirubin: 0.4 mg/dL (ref 0.3–1.2)
Total Protein: 7.6 g/dL (ref 6.5–8.1)

## 2018-09-16 MED ORDER — SODIUM CHLORIDE 0.9 % IV SOLN
Freq: Once | INTRAVENOUS | Status: AC
  Administered 2018-09-16: 15:00:00 via INTRAVENOUS
  Filled 2018-09-16: qty 250

## 2018-09-16 MED ORDER — PROCHLORPERAZINE MALEATE 10 MG PO TABS: 10.0000 mg | ORAL_TABLET | Freq: Once | ORAL | Status: DC

## 2018-09-16 MED ORDER — ZOLEDRONIC ACID 4 MG/100ML IV SOLN
4.0000 mg | Freq: Once | INTRAVENOUS | Status: AC
  Administered 2018-09-16: 4 mg via INTRAVENOUS
  Filled 2018-09-16: qty 100

## 2018-09-16 MED ORDER — BORTEZOMIB CHEMO SQ INJECTION 3.5 MG (2.5MG/ML)
1.3000 mg/m2 | Freq: Once | INTRAMUSCULAR | Status: AC
  Administered 2018-09-16: 2.5 mg via SUBCUTANEOUS
  Filled 2018-09-16: qty 1

## 2018-09-16 NOTE — Patient Instructions (Signed)
[facility] Discharge Instructions for Patients Receiving Chemotherapy  Today you received the following chemotherapy agents: Velcade and Zometa  To help prevent nausea and vomiting after your treatment, we encourage you to take your nausea medication as directed.   If you develop nausea and vomiting that is not controlled by your nausea medication, call the clinic.   BELOW ARE SYMPTOMS THAT SHOULD BE REPORTED IMMEDIATELY:  *FEVER GREATER THAN 100.5 F  *CHILLS WITH OR WITHOUT FEVER  NAUSEA AND VOMITING THAT IS NOT CONTROLLED WITH YOUR NAUSEA MEDICATION  *UNUSUAL SHORTNESS OF BREATH  *UNUSUAL BRUISING OR BLEEDING  TENDERNESS IN MOUTH AND THROAT WITH OR WITHOUT PRESENCE OF ULCERS  *URINARY PROBLEMS  *BOWEL PROBLEMS  UNUSUAL RASH Items with * indicate a potential emergency and should be followed up as soon as possible.  Feel free to call the clinic should you have any questions or concerns. The clinic phone number is [phone].  Please show the [facility] at check-in to the Emergency Department and triage nurse.

## 2018-09-17 LAB — KAPPA/LAMBDA LIGHT CHAINS
Kappa free light chain: 30.9 mg/L — ABNORMAL HIGH (ref 3.3–19.4)
Kappa, lambda light chain ratio: 2.09 — ABNORMAL HIGH (ref 0.26–1.65)
Lambda free light chains: 14.8 mg/L (ref 5.7–26.3)

## 2018-09-17 LAB — PROTEIN ELECTROPHORESIS, SERUM
A/G Ratio: 1.1 (ref 0.7–1.7)
Albumin ELP: 3.7 g/dL (ref 2.9–4.4)
Alpha-1-Globulin: 0.2 g/dL (ref 0.0–0.4)
Alpha-2-Globulin: 0.7 g/dL (ref 0.4–1.0)
Beta Globulin: 1 g/dL (ref 0.7–1.3)
Gamma Globulin: 1.3 g/dL (ref 0.4–1.8)
Globulin, Total: 3.3 g/dL (ref 2.2–3.9)
M-Spike, %: 0.2 g/dL — ABNORMAL HIGH
TOTAL PROTEIN ELP: 7 g/dL (ref 6.0–8.5)

## 2018-09-27 ENCOUNTER — Other Ambulatory Visit: Payer: Self-pay | Admitting: *Deleted

## 2018-09-27 DIAGNOSIS — C9000 Multiple myeloma not having achieved remission: Secondary | ICD-10-CM

## 2018-09-30 ENCOUNTER — Inpatient Hospital Stay: Payer: 59 | Attending: Oncology

## 2018-09-30 ENCOUNTER — Other Ambulatory Visit: Payer: Self-pay | Admitting: Oncology

## 2018-09-30 ENCOUNTER — Other Ambulatory Visit: Payer: Self-pay | Admitting: *Deleted

## 2018-09-30 ENCOUNTER — Inpatient Hospital Stay: Payer: 59

## 2018-09-30 VITALS — BP 123/76 | HR 80 | Temp 97.5°F | Resp 17

## 2018-09-30 DIAGNOSIS — Z5112 Encounter for antineoplastic immunotherapy: Secondary | ICD-10-CM | POA: Diagnosis not present

## 2018-09-30 DIAGNOSIS — C9000 Multiple myeloma not having achieved remission: Secondary | ICD-10-CM

## 2018-09-30 DIAGNOSIS — Z9481 Bone marrow transplant status: Secondary | ICD-10-CM

## 2018-09-30 LAB — CMP (CANCER CENTER ONLY)
ALT: 22 U/L (ref 0–44)
AST: 23 U/L (ref 15–41)
Albumin: 3.8 g/dL (ref 3.5–5.0)
Alkaline Phosphatase: 81 U/L (ref 38–126)
Anion gap: 8 (ref 5–15)
BUN: 32 mg/dL — ABNORMAL HIGH (ref 8–23)
CO2: 23 mmol/L (ref 22–32)
Calcium: 9.6 mg/dL (ref 8.9–10.3)
Chloride: 108 mmol/L (ref 98–111)
Creatinine: 1.03 mg/dL (ref 0.61–1.24)
GFR, Est AFR Am: >60 mL/min (ref >60)
GFR, Estimated: >60 mL/min (ref >60)
GLUCOSE: 102 mg/dL — AB (ref 70–99)
Potassium: 4.3 mmol/L (ref 3.5–5.1)
Sodium: 139 mmol/L (ref 135–145)
Total Bilirubin: 0.3 mg/dL (ref 0.3–1.2)
Total Protein: 7.4 g/dL (ref 6.5–8.1)

## 2018-09-30 LAB — CBC WITH DIFFERENTIAL (CANCER CENTER ONLY)
Abs Immature Granulocytes: 0.01 10*3/uL (ref 0.00–0.07)
Basophils Absolute: 0 10*3/uL (ref 0.0–0.1)
Basophils Relative: 0 %
Eosinophils Absolute: 0.2 10*3/uL (ref 0.0–0.5)
Eosinophils Relative: 3 %
HCT: 37.8 % — ABNORMAL LOW (ref 39.0–52.0)
Hemoglobin: 13.1 g/dL (ref 13.0–17.0)
IMMATURE GRANULOCYTES: 0 %
Lymphocytes Relative: 25 %
Lymphs Abs: 1.6 10*3/uL (ref 0.7–4.0)
MCH: 36 pg — ABNORMAL HIGH (ref 26.0–34.0)
MCHC: 34.7 g/dL (ref 30.0–36.0)
MCV: 103.8 fL — AB (ref 80.0–100.0)
MONOS PCT: 13 %
Monocytes Absolute: 0.8 10*3/uL (ref 0.1–1.0)
NEUTROS PCT: 59 %
Neutro Abs: 3.7 10*3/uL (ref 1.7–7.7)
PLATELETS: 115 10*3/uL — AB (ref 150–400)
RBC: 3.64 MIL/uL — ABNORMAL LOW (ref 4.22–5.81)
RDW: 13.4 % (ref 11.5–15.5)
WBC Count: 6.3 10*3/uL (ref 4.0–10.5)
nRBC: 0 % (ref 0.0–0.2)

## 2018-09-30 MED ORDER — BORTEZOMIB CHEMO SQ INJECTION 3.5 MG (2.5MG/ML)
1.3000 mg/m2 | Freq: Once | INTRAMUSCULAR | Status: AC
  Administered 2018-09-30: 2.5 mg via SUBCUTANEOUS
  Filled 2018-09-30: qty 1

## 2018-09-30 MED ORDER — PROCHLORPERAZINE MALEATE 10 MG PO TABS: 10.0000 mg | ORAL_TABLET | Freq: Once | ORAL | Status: DC

## 2018-09-30 NOTE — Patient Instructions (Signed)
[facility] Discharge Instructions for Patients Receiving Chemotherapy  Today you received the following chemotherapy agents Bortezomib (VELCADE).  To help prevent nausea and vomiting after your treatment, we encourage you to take your nausea medication as prescribed.   If you develop nausea and vomiting that is not controlled by your nausea medication, call the clinic.   BELOW ARE SYMPTOMS THAT SHOULD BE REPORTED IMMEDIATELY:  *FEVER GREATER THAN 100.5 F  *CHILLS WITH OR WITHOUT FEVER  NAUSEA AND VOMITING THAT IS NOT CONTROLLED WITH YOUR NAUSEA MEDICATION  *UNUSUAL SHORTNESS OF BREATH  *UNUSUAL BRUISING OR BLEEDING  TENDERNESS IN MOUTH AND THROAT WITH OR WITHOUT PRESENCE OF ULCERS  *URINARY PROBLEMS  *BOWEL PROBLEMS  UNUSUAL RASH Items with * indicate a potential emergency and should be followed up as soon as possible.  Feel free to call the clinic should you have any questions or concerns. The clinic phone number is [phone].  Please show the CHEMO ALERT CARD at check-in to the Emergency Department and triage nurse.   

## 2018-10-09 ENCOUNTER — Other Ambulatory Visit: Payer: Self-pay | Admitting: Oncology

## 2018-10-14 ENCOUNTER — Inpatient Hospital Stay: Payer: 59

## 2018-10-14 VITALS — BP 118/75 | HR 92 | Temp 98.1°F | Resp 18

## 2018-10-14 DIAGNOSIS — C9000 Multiple myeloma not having achieved remission: Secondary | ICD-10-CM

## 2018-10-14 DIAGNOSIS — Z5112 Encounter for antineoplastic immunotherapy: Secondary | ICD-10-CM | POA: Diagnosis not present

## 2018-10-14 DIAGNOSIS — Z9481 Bone marrow transplant status: Secondary | ICD-10-CM

## 2018-10-14 LAB — CBC WITH DIFFERENTIAL (CANCER CENTER ONLY)
Abs Immature Granulocytes: 0.01 10*3/uL (ref 0.00–0.07)
Basophils Absolute: 0 10*3/uL (ref 0.0–0.1)
Basophils Relative: 0 %
Eosinophils Absolute: 0.1 10*3/uL (ref 0.0–0.5)
Eosinophils Relative: 2 %
HCT: 37 % — ABNORMAL LOW (ref 39.0–52.0)
Hemoglobin: 12.7 g/dL — ABNORMAL LOW (ref 13.0–17.0)
Immature Granulocytes: 0 %
LYMPHS PCT: 22 %
Lymphs Abs: 1.3 10*3/uL (ref 0.7–4.0)
MCH: 36.4 pg — ABNORMAL HIGH (ref 26.0–34.0)
MCHC: 34.3 g/dL (ref 30.0–36.0)
MCV: 106 fL — ABNORMAL HIGH (ref 80.0–100.0)
Monocytes Absolute: 0.8 10*3/uL (ref 0.1–1.0)
Monocytes Relative: 13 %
Neutro Abs: 3.7 10*3/uL (ref 1.7–7.7)
Neutrophils Relative %: 63 %
Platelet Count: 102 10*3/uL — ABNORMAL LOW (ref 150–400)
RBC: 3.49 MIL/uL — ABNORMAL LOW (ref 4.22–5.81)
RDW: 13.5 % (ref 11.5–15.5)
WBC: 5.9 10*3/uL (ref 4.0–10.5)
nRBC: 0 % (ref 0.0–0.2)

## 2018-10-14 MED ORDER — PROCHLORPERAZINE MALEATE 10 MG PO TABS: 10.0000 mg | ORAL_TABLET | Freq: Once | ORAL | Status: DC

## 2018-10-14 MED ORDER — BORTEZOMIB CHEMO SQ INJECTION 3.5 MG (2.5MG/ML)
1.3000 mg/m2 | Freq: Once | INTRAMUSCULAR | Status: AC
  Administered 2018-10-14: 2.5 mg via SUBCUTANEOUS
  Filled 2018-10-14: qty 1

## 2018-10-14 NOTE — Patient Instructions (Signed)
[facility] Discharge Instructions for Patients Receiving Chemotherapy  Today you received the following chemotherapy agents Velcade.  To help prevent nausea and vomiting after your treatment, we encourage you to take your nausea medication as directed.  If you develop nausea and vomiting that is not controlled by your nausea medication, call the clinic.   BELOW ARE SYMPTOMS THAT SHOULD BE REPORTED IMMEDIATELY:  *FEVER GREATER THAN 100.5 F  *CHILLS WITH OR WITHOUT FEVER  NAUSEA AND VOMITING THAT IS NOT CONTROLLED WITH YOUR NAUSEA MEDICATION  *UNUSUAL SHORTNESS OF BREATH  *UNUSUAL BRUISING OR BLEEDING  TENDERNESS IN MOUTH AND THROAT WITH OR WITHOUT PRESENCE OF ULCERS  *URINARY PROBLEMS  *BOWEL PROBLEMS  UNUSUAL RASH Items with * indicate a potential emergency and should be followed up as soon as possible.  Feel free to call the clinic should you have any questions or concerns. The clinic phone number is [phone].  Please show the CHEMO ALERT CARD at check-in to the Emergency Department and triage nurse.   

## 2018-10-14 NOTE — Progress Notes (Signed)
OK to treat with just CBC today per MD Magrinat

## 2018-10-15 LAB — PROTEIN ELECTROPHORESIS, SERUM
A/G Ratio: 1.2 (ref 0.7–1.7)
Albumin ELP: 3.6 g/dL (ref 2.9–4.4)
Alpha-1-Globulin: 0.2 g/dL (ref 0.0–0.4)
Alpha-2-Globulin: 0.6 g/dL (ref 0.4–1.0)
Beta Globulin: 0.9 g/dL (ref 0.7–1.3)
GLOBULIN, TOTAL: 3 g/dL (ref 2.2–3.9)
Gamma Globulin: 1.2 g/dL (ref 0.4–1.8)
M-Spike, %: 0.2 g/dL — ABNORMAL HIGH
Total Protein ELP: 6.6 g/dL (ref 6.0–8.5)

## 2018-10-15 LAB — KAPPA/LAMBDA LIGHT CHAINS
Kappa free light chain: 26.7 mg/L — ABNORMAL HIGH (ref 3.3–19.4)
Kappa, lambda light chain ratio: 1.72 — ABNORMAL HIGH (ref 0.26–1.65)
Lambda free light chains: 15.5 mg/L (ref 5.7–26.3)

## 2018-10-28 ENCOUNTER — Telehealth: Payer: Self-pay | Admitting: *Deleted

## 2018-10-28 ENCOUNTER — Inpatient Hospital Stay: Payer: 59 | Attending: Oncology

## 2018-10-28 ENCOUNTER — Inpatient Hospital Stay: Payer: 59

## 2018-10-28 VITALS — BP 113/73 | HR 88 | Temp 97.7°F | Resp 18

## 2018-10-28 DIAGNOSIS — I10 Essential (primary) hypertension: Secondary | ICD-10-CM | POA: Insufficient documentation

## 2018-10-28 DIAGNOSIS — R110 Nausea: Secondary | ICD-10-CM | POA: Diagnosis not present

## 2018-10-28 DIAGNOSIS — C9000 Multiple myeloma not having achieved remission: Secondary | ICD-10-CM

## 2018-10-28 DIAGNOSIS — Z5112 Encounter for antineoplastic immunotherapy: Secondary | ICD-10-CM | POA: Diagnosis present

## 2018-10-28 DIAGNOSIS — Z9484 Stem cells transplant status: Secondary | ICD-10-CM | POA: Diagnosis not present

## 2018-10-28 DIAGNOSIS — Z9481 Bone marrow transplant status: Secondary | ICD-10-CM

## 2018-10-28 DIAGNOSIS — E039 Hypothyroidism, unspecified: Secondary | ICD-10-CM | POA: Insufficient documentation

## 2018-10-28 DIAGNOSIS — R509 Fever, unspecified: Secondary | ICD-10-CM | POA: Insufficient documentation

## 2018-10-28 LAB — CBC WITH DIFFERENTIAL (CANCER CENTER ONLY)
ABS IMMATURE GRANULOCYTES: 0.02 10*3/uL (ref 0.00–0.07)
Basophils Absolute: 0 10*3/uL (ref 0.0–0.1)
Basophils Relative: 0 %
EOS ABS: 0.2 10*3/uL (ref 0.0–0.5)
Eosinophils Relative: 3 %
HCT: 36.9 % — ABNORMAL LOW (ref 39.0–52.0)
Hemoglobin: 12.8 g/dL — ABNORMAL LOW (ref 13.0–17.0)
IMMATURE GRANULOCYTES: 0 %
Lymphocytes Relative: 23 %
Lymphs Abs: 1.4 10*3/uL (ref 0.7–4.0)
MCH: 36.3 pg — ABNORMAL HIGH (ref 26.0–34.0)
MCHC: 34.7 g/dL (ref 30.0–36.0)
MCV: 104.5 fL — ABNORMAL HIGH (ref 80.0–100.0)
Monocytes Absolute: 1 10*3/uL (ref 0.1–1.0)
Monocytes Relative: 17 %
NRBC: 0 % (ref 0.0–0.2)
Neutro Abs: 3.3 10*3/uL (ref 1.7–7.7)
Neutrophils Relative %: 57 %
PLATELETS: 99 10*3/uL — AB (ref 150–400)
RBC: 3.53 MIL/uL — ABNORMAL LOW (ref 4.22–5.81)
RDW: 13.5 % (ref 11.5–15.5)
WBC Count: 5.8 10*3/uL (ref 4.0–10.5)

## 2018-10-28 LAB — CMP (CANCER CENTER ONLY)
ALT: 20 U/L (ref 0–44)
AST: 25 U/L (ref 15–41)
Albumin: 3.6 g/dL (ref 3.5–5.0)
Alkaline Phosphatase: 76 U/L (ref 38–126)
Anion gap: 9 (ref 5–15)
BUN: 35 mg/dL — ABNORMAL HIGH (ref 8–23)
CHLORIDE: 108 mmol/L (ref 98–111)
CO2: 23 mmol/L (ref 22–32)
Calcium: 9.4 mg/dL (ref 8.9–10.3)
Creatinine: 1.13 mg/dL (ref 0.61–1.24)
GFR, Estimated: >60 mL/min (ref >60)
Glucose, Bld: 96 mg/dL (ref 70–99)
Potassium: 4.1 mmol/L (ref 3.5–5.1)
Sodium: 140 mmol/L (ref 135–145)
Total Bilirubin: 0.4 mg/dL (ref 0.3–1.2)
Total Protein: 7 g/dL (ref 6.5–8.1)

## 2018-10-28 MED ORDER — BORTEZOMIB CHEMO SQ INJECTION 3.5 MG (2.5MG/ML)
1.3000 mg/m2 | Freq: Once | INTRAMUSCULAR | Status: AC
  Administered 2018-10-28: 2.5 mg via SUBCUTANEOUS
  Filled 2018-10-28: qty 1

## 2018-10-28 MED ORDER — PROCHLORPERAZINE MALEATE 10 MG PO TABS: 10.0000 mg | ORAL_TABLET | Freq: Once | ORAL | Status: DC

## 2018-10-28 NOTE — Progress Notes (Signed)
Reviewed pt labs (CBC) with Dr. [name] and pt ok to treat with platelets 99

## 2018-10-28 NOTE — Telephone Encounter (Signed)
Ok to treat with velcade with platelet count of 99,000 per MD.

## 2018-10-28 NOTE — Patient Instructions (Signed)
[facility] Discharge Instructions for Patients Receiving Chemotherapy  Today you received the following chemotherapy agents Bortezomib (VELCADE).  To help prevent nausea and vomiting after your treatment, we encourage you to take your nausea medication as prescribed.   If you develop nausea and vomiting that is not controlled by your nausea medication, call the clinic.   BELOW ARE SYMPTOMS THAT SHOULD BE REPORTED IMMEDIATELY:  *FEVER GREATER THAN 100.5 F  *CHILLS WITH OR WITHOUT FEVER  NAUSEA AND VOMITING THAT IS NOT CONTROLLED WITH YOUR NAUSEA MEDICATION  *UNUSUAL SHORTNESS OF BREATH  *UNUSUAL BRUISING OR BLEEDING  TENDERNESS IN MOUTH AND THROAT WITH OR WITHOUT PRESENCE OF ULCERS  *URINARY PROBLEMS  *BOWEL PROBLEMS  UNUSUAL RASH Items with * indicate a potential emergency and should be followed up as soon as possible.  Feel free to call the clinic should you have any questions or concerns. The clinic phone number is [phone].  Please show the CHEMO ALERT CARD at check-in to the Emergency Department and triage nurse.   

## 2018-11-08 NOTE — Progress Notes (Signed)
[place]  [phone] [phone]    ID: Andre [name]  DOB: 05-Nov-1950  MR#: [id number] CSN#: [id number]   Patient Care Team: Andre Anger, MD as PCP - General (Internal Medicine) Andre [name], Andre Dad, MD as Consulting Physician (Oncology) Andre Living, MD as Consulting Physician (Neurology) Andre Lewandowsky, MD as Consulting Physician (Internal Medicine) Andre Ducking, MD as Consulting Physician (Neurology) Andre [name], DDS as Referring Physician (Oral Surgery) OTHERS: Andre [name] DDS   CHIEF COMPLAINT: Multiple myeloma  CURRENT TREATMENT: Maintenance therapy with bortezomib, zoledronate   INTERVAL HISTORY:  Andre [name] returns today for follow-up and treatment of his multiple myeloma. He is accompanied by his wife Andre [name].  He continues on bortezomid every 14 days. He had fatigue and nausea; he also had a low-grade fever lasting no more than 48 hours. He feels better on his off weeks, but he still has some fatigue.  He also receives zolendronate every 12 weeks, with his most recent dose 09/16/2018. He feels like he has the flu following this treatment, which he treated with Advil, nausea medicine, and tylenol. His next dose is due on 12/09/2018.   Since his last visit here, he has not undergone any additional studies.    REVIEW OF SYSTEMS: Andre [name] said he's doing pretty well. He is still working, split between the office and working from home. He has been trying to exercise, which he uses the indoor bike around 5 to 6 days per week for around 30 minutes per day. He also goes out for a walk, usually on the weekends. He mentions that he needs to return to strength training. The patient denies unusual headaches, visual changes, vomiting, or dizziness. There has been no unusual cough, phlegm production, or pleurisy. This been no change in bowel or bladder habits. The patient denies unexplained weight loss, bleeding, rash, or  fever. A detailed review of systems was otherwise noncontributory.    HISTORY OF PRESENT ILLNESS:  From the original intake nodes:  Andre [name] was worked up for peptic ulcer disease in August of 2011, with significant bleeding and anemia.  The patient was Helicobacter pylori negative.  He received some epinephrine when he had his EGD and then started on Protonix.  [name] anemia slowly resolved so that by September, his hemoglobin was up to 10.9 and by earlier this month, his hemoglobin was up to 12.5.    As part of his general workup, he was found to have a slightly elevated globulin fraction.  In September, his total protein was 8.3 with an albumin of 3.8.  In January, the total protein was 8.4 and albumin 3.6.  With persistence of this slight abnormality, Dr. Olevia [name] obtained serum immunofixation and SPEP.  The SPTP showed an M-spike of 2.67 grams.  A total IgG was 4,190.  Total IgA low at 28.  Total IgM low at 28 also.  The immunofixation showed a monoclonal IgG lambda paraprotein.  There were also monoclonal free lambda light chains present. With this information, the patient was referred for further evaluation.   A diagnosis of myeloma was confirmed by bone marrow biopsy and subsequebnt treatment is as detailed below   PAST MEDICAL HISTORY: Past Medical History:  Diagnosis Date  . Allergy   . Anemia   . Arthritis   . Asthma    no treatment x 20 years  . Depression   . Double vision    occurs at times   . Duodenal ulcer   .  GERD (gastroesophageal reflux disease)   . Hyperlipidemia   . Hypertension   . Hypothyroidism   . Multiple myeloma 07/04/2011  . Thyroid disease    Significant for peptic ulcer disease as noted above.  History of hyperlipidemia.  History of anxiety and depression.  History of GERD which is significantly improved with weight loss and history of reactive airway disease, possibly secondary to the GERD which also improved with weight loss.  There was a  history of obesity, now much improved secondary to exercise and diet.    PAST SURGICAL HISTORY: Past Surgical History:  Procedure Laterality Date  . BONE MARROW TRANSPLANT  2011   for MM  . CARDIOLITE STUDY  11/25/2003   NORMAL  . TONSILLECTOMY       FAMILY HISTORY: Family History  Problem Relation Age of Onset  . Throat cancer Mother   . Hypertension Father   . Stroke Father   . Asthma Father   . Diabetes Father   . Pancreatic cancer Brother    [name] father died at the age of 1 following a stroke.  [name] mother died with cancer of the throat at the age of 52.   She had a history of depression and was a smoker.  Not clear how much alcohol she drank, according to the patient.  The patient had two brothers.  One died from the age of 65 from a "kidney problem."  The second one died at the age of 57 from pancreatic cancer. (This may have been duodenal cancer. 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  His wife of 37+ years was Andre [name], but they divorced. In 2016 he remarried, his current wife's name is Andre [name].  The patient has two daughters:  Andre [name], who works in [place], who had her second child April 2016. Both daughters live here in [place]. The patient is very close to his daughters.  Szumski first daughter, Andre [name], born 03/06/2013, apparently has Weber/Osler/Rendu. The patient is not a church attender.  Andre [name] also participates in [place] even though actually there is no alcohol or drug problem in the family.  He participates in this group and he gets quite a bit of support from it.     ADVANCED DIRECTIVES: In place   HEALTH MAINTENANCE: (updated 07/16/2013) Social History   Tobacco Use  . Smoking status: Former Smoker    Years: 3.00    Last attempt to quit: 03/27/1969    Years since  quitting: 49.6  . Smokeless tobacco: Never Used  Substance Use Topics  . Alcohol use: No  . Drug use: No    Colonoscopy:  PSA:  Sept 2014, normal/Dr. [name]  Lipid panel: Sept 2014/Dr. [name]  Allergies  Allergen Reactions  . Atorvastatin Other (See Comments)  . Rosuvastatin     Other reaction(s): Liver Disorder  . Crestor [Rosuvastatin Calcium]     ADVERSE EFFECTS ON LIVER  . Lipitor [Atorvastatin Calcium]   . Septra [Sulfamethoxazole-Trimethoprim] Rash   Current Outpatient Medications  Medication Sig Dispense Refill  . Barnett complex vitamins tablet Take 1 tablet by mouth daily. 100 tablet 3  . cetirizine (ZYRTEC) 10 MG tablet Take 10 mg by mouth daily.    . Cholecalciferol (VITAMIN D3) 2000 units capsule Take 1 capsule (2,000 Units total) by mouth daily. 100 capsule  3  . docusate sodium (COLACE) 100 MG capsule Take 100 mg by mouth daily as needed for mild constipation or moderate constipation.    [name] escitalopram (LEXAPRO) 10 MG tablet TAKE 1 TABLET BY MOUTH  DAILY 90 tablet 3  . feeding supplement, ENSURE ENLIVE, (ENSURE ENLIVE) LIQD Take 237 mLs by mouth 2 (two) times daily between meals. 237 mL 12  . HYDROcodone-acetaminophen (NORCO) 5-325 MG tablet 1/2 to 2 q 6 hours 30 tablet 0  . hydrOXYzine (ATARAX/VISTARIL) 25 MG tablet Take 1 tablet (25 mg total) by mouth 3 (three) times daily as needed. 30 tablet 0  . levothyroxine (SYNTHROID, LEVOTHROID) 150 MCG tablet TAKE 1 TABLET BY MOUTH  EVERY MORNING ON AN EMPTY  STOMACH 90 tablet 3  . ondansetron (ZOFRAN) 8 MG tablet TAKE 1 TABLET BY MOUTH EVERY 8 HOURS AS NEEDED FOR NAUSEA OR VOMITING 20 tablet 0  . pantoprazole (PROTONIX) 40 MG tablet TAKE 1 TABLET BY MOUTH  TWICE A DAY BEFORE MEALS 180 tablet 3  . ranitidine (ZANTAC) 150 MG tablet Take 1 tablet (150 mg total) by mouth 2 (two) times daily. 90 tablet 3  . sildenafil (VIAGRA) 25 MG tablet Take 1 tablet (25 mg total) by mouth daily as needed for erectile dysfunction. 10 tablet 0    . triamcinolone cream (KENALOG) 0.5 % Apply 1 application topically 4 (four) times daily. 60 g 2  . valACYclovir (VALTREX) 1000 MG tablet TAKE 1 TABLET BY MOUTH  DAILY 90 tablet 3  . zolendronic acid (ZOMETA) 4 MG/5ML injection Inject 4 mg into the vein every 3 (three) months.     No current facility-administered medications for this visit.    Objective: Middle-aged white man in no acute distress  Vitals:   11/11/18 1435  BP: 111/70  Pulse: 89  Resp: 18  Temp: 98.2 F (36.8 C)  SpO2: 98%  Body mass index is 27.2 kg/m. Filed Weights   11/11/18 1435  Weight: 184 lb 3.2 oz (83.6 kg)   Sclerae unicteric, pupils round and equal Oropharynx clear and moist No cervical or supraclavicular adenopathy Lungs no rales or rhonchi Heart regular rate and rhythm Abd soft, nontender, positive bowel sounds MSK no focal spinal tenderness, no upper extremity lymphedema Neuro: nonfocal, well oriented, appropriate affect    LAB RESULTS:  Appointment on 11/11/2018  Component Date Value Ref Range Status  . WBC Count 11/11/2018 5.6  4.0 - 10.5 K/uL Final  . RBC 11/11/2018 3.57* 4.22 - 5.81 MIL/uL Final  . Hemoglobin 11/11/2018 12.9* 13.0 - 17.0 g/dL Final  . HCT 11/11/2018 37.5* 39.0 - 52.0 % Final  . MCV 11/11/2018 105.0* 80.0 - 100.0 fL Final  . MCH 11/11/2018 36.1* 26.0 - 34.0 pg Final  . MCHC 11/11/2018 34.4  30.0 - 36.0 g/dL Final  . RDW 11/11/2018 13.4  11.5 - 15.5 % Final  . Platelet Count 11/11/2018 107* 150 - 400 K/uL Final  . nRBC 11/11/2018 0.0  0.0 - 0.2 % Final  . Neutrophils Relative % 11/11/2018 58  % Final  . Neutro Abs 11/11/2018 3.3  1.7 - 7.7 K/uL Final  . Lymphocytes Relative 11/11/2018 24  % Final  . Lymphs Abs 11/11/2018 1.4  0.7 - 4.0 K/uL Final  . Monocytes Relative 11/11/2018 15  % Final  . Monocytes Absolute 11/11/2018 0.9  0.1 - 1.0 K/uL Final  . Eosinophils Relative 11/11/2018 3  % Final  . Eosinophils Absolute 11/11/2018 0.2  0.0 - 0.5 K/uL Final  .  Basophils Relative 11/11/2018 0  % Final  . Basophils Absolute 11/11/2018 0.0  0.0 - 0.1 K/uL Final  . Immature Granulocytes 11/11/2018 0  % Final  . Abs Immature Granulocytes 11/11/2018 0.01  0.00 - 0.07 K/uL Final   Performed at [facility], [place] [address] [place], [place] [address]  . Sodium 11/11/2018 138  135 - 145 mmol/L Final  . Potassium 11/11/2018 4.5  3.5 - 5.1 mmol/L Final  . Chloride 11/11/2018 105  98 - 111 mmol/L Final  . CO2 11/11/2018 22  22 - 32 mmol/L Final  . Glucose, Bld 11/11/2018 90  70 - 99 mg/dL Final  . BUN 11/11/2018 32* 8 - 23 mg/dL Final  . Creatinine 11/11/2018 1.10  0.61 - 1.24 mg/dL Final  . Calcium 11/11/2018 9.5  8.9 - 10.3 mg/dL Final  . Total Protein 11/11/2018 7.2  6.5 - 8.1 g/dL Final  . Albumin 11/11/2018 3.8  3.5 - 5.0 g/dL Final  . AST 11/11/2018 25  15 - 41 U/L Final  . ALT 11/11/2018 19  0 - 44 U/L Final  . Alkaline Phosphatase 11/11/2018 76  38 - 126 U/L Final  . Total Bilirubin 11/11/2018 0.3  0.3 - 1.2 mg/dL Final  . GFR, Est Non Af Am 11/11/2018 >60  >60 mL/min Final  . GFR, Est AFR Am 11/11/2018 >60  >60 mL/min Final  . Anion gap 11/11/2018 11  5 - 15 Final   Performed at [facility], [place] [address] [place], [place] [address]    STUDIES: We discussed his M spike and kappa lambda ratio in detail today  ASSESSMENT: 68 y.o. [place] man with a history of multiple myeloma diagnosed February of 2012, with an initial M-spike of 2.6 g/dL, IFE showing an IgG lambda paraprotein and free lambda light chains in the urine. Cytogenetics showed trisomy 78. Bone marrow biopsy showed a 22% plasmacytosis. Treated with   (1) bortezomib (subcutaneously), lenalidomide, and dexamethasone, with repeat bone marrow biopsy May of 2012 showing 10% plasmacytosis   (2) high-dose chemotherapy with BCNU and melphalan at [facility], followed by stem cell rescue July of 2012   (3) on zoledronic acid started December of  2012, initially monthly, currently given every 3 months, most recent dose  12/07/2015  (4) low-dose lenalidomide resumed April 2013, interrupted several times.  Resumed again on 02/19/2013, ata dose of 5 mg daily, 21 days on and 7 days off, later further reduced to 7 days on, 7 days off  (5) CNS symptoms and abnormal brain MRI extensively evaluated by neurology with no definitive diagnosis established  (6) rising M spike noted June 2014 but did not meet criteria for carfilzomib study  (7) on lenalidomide 25 mg daily, 14 days on, 7 days off, starting 04/18/2013, interrupted December 2014 because of rash;   (a) resumed January 2015 at 10 mg/ day at 21 days on/ 7 days off  (Rhyatt) starting 08/18/2014 decreased to 10 mg/ day 14 days on, 7 days off because of cytopenias  (c) as of February 2016 was on 5 mg daily 7 days on 7 days off  (d) lenalidomide discontinued December 2016 with evidence of disease progression  (8) transient global amnesia 05/29/2015, resolved without intervention  (9) starting PVD 10/25/2015 w ASA 325 thromboprophylaxis, valacyclovir prophylaxis, last dose 12/17/2015  (a) pomalidomide 4 mg/d days 1-14  (Ramel) bortezomib sQ days 2,5,9,12 of each 21 day cycle  (c) dexamethasome 20 mg two days a week  (d)  dexamethasone bortezomib and pomalidomide discontinued late December 2018 with poor tolerance  (10) metapneumovirus pneumonia April 2017  (a) completing course of steroids and week of bactrim mid April 2017  (11) status post second autologous transplant at [facility] 02/04/2016(preparatory regimen melphalan 200 mg/m)  (a) received twelve-month vaccinations 03/14/2017 (DPT, Haemophilus, Pneumovax 13, polio)  (Yoshito) 14 months injections 05/04/2016 include DTaP, Hib conjugate, HepB energex Nabor 20 mcg/ml, Prevnar 13  (c) 24 month vaccines due at [facility] June 2019  (12) maintenance therapy started November 2017, consisting of  (a) bortezomib 1.3 mg/M2 every 14 days, first dose 07/27/2016  (Silviano)  pomalidomide 1 mg days 1-21 Q28 days, started 07/19/2016  (c) zolendronate monthly started 07/27/2016 (previously Q 3 months) however patient unable to tolerate, and changed back to q3 month in April, 2018   (d) Bortezomib changed to monthly as of June 2018 because of tolerance issues, however discontinued after 09/14/2017 dose because of a rise in his M spike  (e) pomalidomide held after 10/18/2017 in preparation for possible study at [facility] (venetoclax)--never resumed  (f) with numbers actually improved off treatment, resumed every 2-week bortezomib 12/25/2017    PLAN:  Andre [name] will be 3 years out from his second autologous transplant when he next sees me 3 months from now.  He has done very well, with a very low and stable M spike and very low and stable kappa/lambda ratio.  Of course this is a chronic illness and he has chronic side effects from his treatment including nausea and fatigue particularly.  However just about everything else that I have for him would probably cause worse not better side effects.  We discussed the possibility of education and I am a bit concerned that his myeloma might take off during that time and be harder to bring back under control.  In short I think he is doing terrific and I am reluctant to change what we are doing since it is working so well  He is in agreement with this plan and so we are proceeding with bortezomib today and every 2weeks.  He will have zoledronate in another month.  He will see me again in 3 months from now.  He knows to call for any other issue that may develop before the next visit.   Ridgely [name], Andre Dad, MD  11/11/18 3:06 PM Medical Oncology and Hematology [facility] [address] [place], [place] [address] Tel. [phone]    Fax. [phone]   I, Jacqualyn Posey am acting as a Education administrator for Chauncey Cruel, MD.   I, Lurline Del MD, have reviewed the above documentation for accuracy and completeness, and I agree  with the above.

## 2018-11-11 ENCOUNTER — Inpatient Hospital Stay: Payer: 59

## 2018-11-11 ENCOUNTER — Inpatient Hospital Stay (HOSPITAL_BASED_OUTPATIENT_CLINIC_OR_DEPARTMENT_OTHER): Payer: 59 | Admitting: Oncology

## 2018-11-11 VITALS — BP 111/70 | HR 89 | Temp 98.2°F | Resp 18 | Ht 69.0 in | Wt 184.2 lb

## 2018-11-11 DIAGNOSIS — R110 Nausea: Secondary | ICD-10-CM

## 2018-11-11 DIAGNOSIS — C9000 Multiple myeloma not having achieved remission: Secondary | ICD-10-CM

## 2018-11-11 DIAGNOSIS — Z5112 Encounter for antineoplastic immunotherapy: Secondary | ICD-10-CM | POA: Diagnosis not present

## 2018-11-11 DIAGNOSIS — Z9484 Stem cells transplant status: Secondary | ICD-10-CM

## 2018-11-11 DIAGNOSIS — E039 Hypothyroidism, unspecified: Secondary | ICD-10-CM

## 2018-11-11 DIAGNOSIS — R509 Fever, unspecified: Secondary | ICD-10-CM | POA: Diagnosis not present

## 2018-11-11 DIAGNOSIS — Z9481 Bone marrow transplant status: Secondary | ICD-10-CM

## 2018-11-11 DIAGNOSIS — I10 Essential (primary) hypertension: Secondary | ICD-10-CM

## 2018-11-11 LAB — CBC WITH DIFFERENTIAL (CANCER CENTER ONLY)
Abs Immature Granulocytes: 0.01 10*3/uL (ref 0.00–0.07)
Basophils Absolute: 0 10*3/uL (ref 0.0–0.1)
Basophils Relative: 0 %
Eosinophils Absolute: 0.2 10*3/uL (ref 0.0–0.5)
Eosinophils Relative: 3 %
HCT: 37.5 % — ABNORMAL LOW (ref 39.0–52.0)
Hemoglobin: 12.9 g/dL — ABNORMAL LOW (ref 13.0–17.0)
Immature Granulocytes: 0 %
Lymphocytes Relative: 24 %
Lymphs Abs: 1.4 10*3/uL (ref 0.7–4.0)
MCH: 36.1 pg — ABNORMAL HIGH (ref 26.0–34.0)
MCHC: 34.4 g/dL (ref 30.0–36.0)
MCV: 105 fL — ABNORMAL HIGH (ref 80.0–100.0)
Monocytes Absolute: 0.9 10*3/uL (ref 0.1–1.0)
Monocytes Relative: 15 %
Neutro Abs: 3.3 10*3/uL (ref 1.7–7.7)
Neutrophils Relative %: 58 %
Platelet Count: 107 10*3/uL — ABNORMAL LOW (ref 150–400)
RBC: 3.57 MIL/uL — ABNORMAL LOW (ref 4.22–5.81)
RDW: 13.4 % (ref 11.5–15.5)
WBC Count: 5.6 10*3/uL (ref 4.0–10.5)
nRBC: 0 % (ref 0.0–0.2)

## 2018-11-11 LAB — CMP (CANCER CENTER ONLY)
ALT: 19 U/L (ref 0–44)
AST: 25 U/L (ref 15–41)
Albumin: 3.8 g/dL (ref 3.5–5.0)
Alkaline Phosphatase: 76 U/L (ref 38–126)
Anion gap: 11 (ref 5–15)
BUN: 32 mg/dL — ABNORMAL HIGH (ref 8–23)
CHLORIDE: 105 mmol/L (ref 98–111)
CO2: 22 mmol/L (ref 22–32)
Calcium: 9.5 mg/dL (ref 8.9–10.3)
Creatinine: 1.1 mg/dL (ref 0.61–1.24)
GFR, Est AFR Am: >60 mL/min (ref >60)
GFR, Estimated: >60 mL/min (ref >60)
GLUCOSE: 90 mg/dL (ref 70–99)
Potassium: 4.5 mmol/L (ref 3.5–5.1)
SODIUM: 138 mmol/L (ref 135–145)
Total Bilirubin: 0.3 mg/dL (ref 0.3–1.2)
Total Protein: 7.2 g/dL (ref 6.5–8.1)

## 2018-11-11 MED ORDER — PROCHLORPERAZINE MALEATE 10 MG PO TABS: 10.0000 mg | ORAL_TABLET | Freq: Once | ORAL | Status: DC

## 2018-11-11 MED ORDER — BORTEZOMIB CHEMO SQ INJECTION 3.5 MG (2.5MG/ML)
1.3000 mg/m2 | Freq: Once | INTRAMUSCULAR | Status: AC
  Administered 2018-11-11: 2.5 mg via SUBCUTANEOUS
  Filled 2018-11-11: qty 1

## 2018-11-11 NOTE — Patient Instructions (Signed)
[facility] Discharge Instructions for Patients Receiving Chemotherapy  Today you received the following chemotherapy agents: Bortezomib (Velcade)  To help prevent nausea and vomiting after your treatment, we encourage you to take your nausea medication as directed.    If you develop nausea and vomiting that is not controlled by your nausea medication, call the clinic.   BELOW ARE SYMPTOMS THAT SHOULD BE REPORTED IMMEDIATELY:  *FEVER GREATER THAN 100.5 F  *CHILLS WITH OR WITHOUT FEVER  NAUSEA AND VOMITING THAT IS NOT CONTROLLED WITH YOUR NAUSEA MEDICATION  *UNUSUAL SHORTNESS OF BREATH  *UNUSUAL BRUISING OR BLEEDING  TENDERNESS IN MOUTH AND THROAT WITH OR WITHOUT PRESENCE OF ULCERS  *URINARY PROBLEMS  *BOWEL PROBLEMS  UNUSUAL RASH Items with * indicate a potential emergency and should be followed up as soon as possible.  Feel free to call the clinic should you have any questions or concerns. The clinic phone number is [phone].  Please show the CHEMO ALERT CARD at check-in to the Emergency Department and triage nurse.   

## 2018-11-12 LAB — KAPPA/LAMBDA LIGHT CHAINS
Kappa free light chain: 27.5 mg/L — ABNORMAL HIGH (ref 3.3–19.4)
Kappa, lambda light chain ratio: 1.91 — ABNORMAL HIGH (ref 0.26–1.65)
Lambda free light chains: 14.4 mg/L (ref 5.7–26.3)

## 2018-11-13 ENCOUNTER — Other Ambulatory Visit: Payer: Self-pay | Admitting: Oncology

## 2018-11-13 LAB — PROTEIN ELECTROPHORESIS, SERUM
A/G Ratio: 1.2 (ref 0.7–1.7)
Albumin ELP: 3.6 g/dL (ref 2.9–4.4)
Alpha-1-Globulin: 0.2 g/dL (ref 0.0–0.4)
Alpha-2-Globulin: 0.7 g/dL (ref 0.4–1.0)
Beta Globulin: 1 g/dL (ref 0.7–1.3)
GAMMA GLOBULIN: 1.2 g/dL (ref 0.4–1.8)
Globulin, Total: 3.1 g/dL (ref 2.2–3.9)
M-Spike, %: 0.3 g/dL — ABNORMAL HIGH
Total Protein ELP: 6.7 g/dL (ref 6.0–8.5)

## 2018-11-22 ENCOUNTER — Other Ambulatory Visit: Payer: Self-pay | Admitting: *Deleted

## 2018-11-25 ENCOUNTER — Inpatient Hospital Stay: Payer: 59

## 2018-11-25 ENCOUNTER — Inpatient Hospital Stay: Payer: 59 | Attending: Oncology

## 2018-11-25 ENCOUNTER — Telehealth: Payer: Self-pay | Admitting: *Deleted

## 2018-11-25 VITALS — BP 103/63 | HR 92 | Temp 98.5°F | Resp 18

## 2018-11-25 DIAGNOSIS — C9000 Multiple myeloma not having achieved remission: Secondary | ICD-10-CM | POA: Diagnosis not present

## 2018-11-25 DIAGNOSIS — Z9481 Bone marrow transplant status: Secondary | ICD-10-CM

## 2018-11-25 DIAGNOSIS — Z5112 Encounter for antineoplastic immunotherapy: Secondary | ICD-10-CM | POA: Diagnosis present

## 2018-11-25 LAB — CBC WITH DIFFERENTIAL (CANCER CENTER ONLY)
Abs Immature Granulocytes: 0.01 10*3/uL (ref 0.00–0.07)
BASOS PCT: 0 %
Basophils Absolute: 0 10*3/uL (ref 0.0–0.1)
Eosinophils Absolute: 0.2 10*3/uL (ref 0.0–0.5)
Eosinophils Relative: 3 %
HCT: 36.9 % — ABNORMAL LOW (ref 39.0–52.0)
Hemoglobin: 12.6 g/dL — ABNORMAL LOW (ref 13.0–17.0)
Immature Granulocytes: 0 %
Lymphocytes Relative: 22 %
Lymphs Abs: 1.3 10*3/uL (ref 0.7–4.0)
MCH: 36.1 pg — AB (ref 26.0–34.0)
MCHC: 34.1 g/dL (ref 30.0–36.0)
MCV: 105.7 fL — ABNORMAL HIGH (ref 80.0–100.0)
Monocytes Absolute: 1.1 10*3/uL — ABNORMAL HIGH (ref 0.1–1.0)
Monocytes Relative: 17 %
Neutro Abs: 3.6 10*3/uL (ref 1.7–7.7)
Neutrophils Relative %: 58 %
Platelet Count: 97 10*3/uL — ABNORMAL LOW (ref 150–400)
RBC: 3.49 MIL/uL — ABNORMAL LOW (ref 4.22–5.81)
RDW: 13.3 % (ref 11.5–15.5)
WBC Count: 6.1 10*3/uL (ref 4.0–10.5)
nRBC: 0 % (ref 0.0–0.2)

## 2018-11-25 MED ORDER — BORTEZOMIB CHEMO SQ INJECTION 3.5 MG (2.5MG/ML)
1.3000 mg/m2 | Freq: Once | INTRAMUSCULAR | Status: AC
  Administered 2018-11-25: 2.5 mg via SUBCUTANEOUS
  Filled 2018-11-25: qty 1

## 2018-11-25 NOTE — Patient Instructions (Signed)
[facility] Discharge Instructions for Patients Receiving Chemotherapy  Today you received the following chemotherapy agents Bortezomib (VELCADE).  To help prevent nausea and vomiting after your treatment, we encourage you to take your nausea medication as prescribed.   If you develop nausea and vomiting that is not controlled by your nausea medication, call the clinic.   BELOW ARE SYMPTOMS THAT SHOULD BE REPORTED IMMEDIATELY:  *FEVER GREATER THAN 100.5 F  *CHILLS WITH OR WITHOUT FEVER  NAUSEA AND VOMITING THAT IS NOT CONTROLLED WITH YOUR NAUSEA MEDICATION  *UNUSUAL SHORTNESS OF BREATH  *UNUSUAL BRUISING OR BLEEDING  TENDERNESS IN MOUTH AND THROAT WITH OR WITHOUT PRESENCE OF ULCERS  *URINARY PROBLEMS  *BOWEL PROBLEMS  UNUSUAL RASH Items with * indicate a potential emergency and should be followed up as soon as possible.  Feel free to call the clinic should you have any questions or concerns. The clinic phone number is [phone].  Please show the CHEMO ALERT CARD at check-in to the Emergency Department and triage nurse.   

## 2018-11-25 NOTE — Telephone Encounter (Signed)
Ok to use CMET from 11/11/2018 for Velcade treatment today.  Ok to treat with platelets of 97,000.

## 2018-11-25 NOTE — Telephone Encounter (Signed)
Per Wilber Bihari NP ok to use CMET from 11/11/2018 and may treat with platelet result of 97,000 with Velcade.

## 2018-12-09 ENCOUNTER — Other Ambulatory Visit: Payer: Self-pay

## 2018-12-09 ENCOUNTER — Inpatient Hospital Stay: Payer: 59

## 2018-12-09 VITALS — BP 126/76 | HR 94 | Temp 98.4°F | Resp 18

## 2018-12-09 DIAGNOSIS — C9000 Multiple myeloma not having achieved remission: Secondary | ICD-10-CM

## 2018-12-09 DIAGNOSIS — Z9481 Bone marrow transplant status: Secondary | ICD-10-CM

## 2018-12-09 DIAGNOSIS — C9002 Multiple myeloma in relapse: Secondary | ICD-10-CM

## 2018-12-09 DIAGNOSIS — Z5112 Encounter for antineoplastic immunotherapy: Secondary | ICD-10-CM | POA: Diagnosis not present

## 2018-12-09 LAB — CMP (CANCER CENTER ONLY)
ALT: 24 U/L (ref 0–44)
AST: 26 U/L (ref 15–41)
Albumin: 3.7 g/dL (ref 3.5–5.0)
Alkaline Phosphatase: 82 U/L (ref 38–126)
Anion gap: 12 (ref 5–15)
BUN: 34 mg/dL — ABNORMAL HIGH (ref 8–23)
CALCIUM: 9.4 mg/dL (ref 8.9–10.3)
CO2: 20 mmol/L — ABNORMAL LOW (ref 22–32)
Chloride: 106 mmol/L (ref 98–111)
Creatinine: 1.06 mg/dL (ref 0.61–1.24)
GFR, Est AFR Am: >60 mL/min (ref >60)
GFR, Estimated: >60 mL/min (ref >60)
Glucose, Bld: 104 mg/dL — ABNORMAL HIGH (ref 70–99)
Potassium: 4.1 mmol/L (ref 3.5–5.1)
Sodium: 138 mmol/L (ref 135–145)
Total Bilirubin: 0.3 mg/dL (ref 0.3–1.2)
Total Protein: 7.5 g/dL (ref 6.5–8.1)

## 2018-12-09 LAB — CBC WITH DIFFERENTIAL (CANCER CENTER ONLY)
Abs Immature Granulocytes: 0.01 10*3/uL (ref 0.00–0.07)
BASOS ABS: 0 10*3/uL (ref 0.0–0.1)
Basophils Relative: 1 %
Eosinophils Absolute: 0.2 10*3/uL (ref 0.0–0.5)
Eosinophils Relative: 2 %
HEMATOCRIT: 38.4 % — AB (ref 39.0–52.0)
Hemoglobin: 13.5 g/dL (ref 13.0–17.0)
Immature Granulocytes: 0 %
Lymphocytes Relative: 20 %
Lymphs Abs: 1.3 10*3/uL (ref 0.7–4.0)
MCH: 36.6 pg — ABNORMAL HIGH (ref 26.0–34.0)
MCHC: 35.2 g/dL (ref 30.0–36.0)
MCV: 104.1 fL — ABNORMAL HIGH (ref 80.0–100.0)
Monocytes Absolute: 1.1 10*3/uL — ABNORMAL HIGH (ref 0.1–1.0)
Monocytes Relative: 17 %
Neutro Abs: 3.9 10*3/uL (ref 1.7–7.7)
Neutrophils Relative %: 60 %
Platelet Count: 110 10*3/uL — ABNORMAL LOW (ref 150–400)
RBC: 3.69 MIL/uL — ABNORMAL LOW (ref 4.22–5.81)
RDW: 13.2 % (ref 11.5–15.5)
WBC Count: 6.5 10*3/uL (ref 4.0–10.5)
nRBC: 0 % (ref 0.0–0.2)

## 2018-12-09 MED ORDER — ZOLEDRONIC ACID 4 MG/100ML IV SOLN
4.0000 mg | Freq: Once | INTRAVENOUS | Status: AC
  Administered 2018-12-09: 4 mg via INTRAVENOUS
  Filled 2018-12-09: qty 100

## 2018-12-09 MED ORDER — BORTEZOMIB CHEMO SQ INJECTION 3.5 MG (2.5MG/ML)
1.3000 mg/m2 | Freq: Once | INTRAMUSCULAR | Status: AC
  Administered 2018-12-09: 2.5 mg via SUBCUTANEOUS
  Filled 2018-12-09: qty 1

## 2018-12-09 MED ORDER — PROCHLORPERAZINE MALEATE 10 MG PO TABS: 10.0000 mg | ORAL_TABLET | Freq: Once | ORAL | Status: DC

## 2018-12-09 NOTE — Patient Instructions (Signed)
[facility] Discharge Instructions for Patients Receiving Chemotherapy  Today you received the following chemotherapy agents Bortezomib (VELCADE).  To help prevent nausea and vomiting after your treatment, we encourage you to take your nausea medication as prescribed.   If you develop nausea and vomiting that is not controlled by your nausea medication, call the clinic.   BELOW ARE SYMPTOMS THAT SHOULD BE REPORTED IMMEDIATELY:  *FEVER GREATER THAN 100.5 F  *CHILLS WITH OR WITHOUT FEVER  NAUSEA AND VOMITING THAT IS NOT CONTROLLED WITH YOUR NAUSEA MEDICATION  *UNUSUAL SHORTNESS OF BREATH  *UNUSUAL BRUISING OR BLEEDING  TENDERNESS IN MOUTH AND THROAT WITH OR WITHOUT PRESENCE OF ULCERS  *URINARY PROBLEMS  *BOWEL PROBLEMS  UNUSUAL RASH Items with * indicate a potential emergency and should be followed up as soon as possible.  Feel free to call the clinic should you have any questions or concerns. The clinic phone number is [phone].  Please show the CHEMO ALERT CARD at check-in to the Emergency Department and triage nurse.   

## 2018-12-10 LAB — PROTEIN ELECTROPHORESIS, SERUM
A/G Ratio: 1.2 (ref 0.7–1.7)
Albumin ELP: 3.7 g/dL (ref 2.9–4.4)
Alpha-1-Globulin: 0.2 g/dL (ref 0.0–0.4)
Alpha-2-Globulin: 0.7 g/dL (ref 0.4–1.0)
BETA GLOBULIN: 1 g/dL (ref 0.7–1.3)
Gamma Globulin: 1.2 g/dL (ref 0.4–1.8)
Globulin, Total: 3 g/dL (ref 2.2–3.9)
M-Spike, %: 0.2 g/dL — ABNORMAL HIGH
Total Protein ELP: 6.7 g/dL (ref 6.0–8.5)

## 2018-12-10 LAB — KAPPA/LAMBDA LIGHT CHAINS
Kappa free light chain: 28.4 mg/L — ABNORMAL HIGH (ref 3.3–19.4)
Kappa, lambda light chain ratio: 1.96 — ABNORMAL HIGH (ref 0.26–1.65)
LAMDA FREE LIGHT CHAINS: 14.5 mg/L (ref 5.7–26.3)

## 2018-12-23 ENCOUNTER — Inpatient Hospital Stay: Payer: 59 | Attending: Oncology

## 2018-12-23 ENCOUNTER — Other Ambulatory Visit: Payer: Self-pay

## 2018-12-23 ENCOUNTER — Inpatient Hospital Stay: Payer: 59

## 2018-12-23 VITALS — BP 101/73 | HR 85 | Temp 98.2°F | Resp 18

## 2018-12-23 DIAGNOSIS — Z5112 Encounter for antineoplastic immunotherapy: Secondary | ICD-10-CM | POA: Insufficient documentation

## 2018-12-23 DIAGNOSIS — C9000 Multiple myeloma not having achieved remission: Secondary | ICD-10-CM | POA: Diagnosis not present

## 2018-12-23 DIAGNOSIS — Z9481 Bone marrow transplant status: Secondary | ICD-10-CM

## 2018-12-23 LAB — CBC WITH DIFFERENTIAL (CANCER CENTER ONLY)
Abs Immature Granulocytes: 0.01 10*3/uL (ref 0.00–0.07)
Basophils Absolute: 0 10*3/uL (ref 0.0–0.1)
Basophils Relative: 1 %
Eosinophils Absolute: 0.2 10*3/uL (ref 0.0–0.5)
Eosinophils Relative: 2 %
HCT: 38 % — ABNORMAL LOW (ref 39.0–52.0)
Hemoglobin: 13.2 g/dL (ref 13.0–17.0)
Immature Granulocytes: 0 %
Lymphocytes Relative: 22 %
Lymphs Abs: 1.4 10*3/uL (ref 0.7–4.0)
MCH: 36.6 pg — ABNORMAL HIGH (ref 26.0–34.0)
MCHC: 34.7 g/dL (ref 30.0–36.0)
MCV: 105.3 fL — ABNORMAL HIGH (ref 80.0–100.0)
Monocytes Absolute: 0.9 10*3/uL (ref 0.1–1.0)
Monocytes Relative: 15 %
Neutro Abs: 3.8 10*3/uL (ref 1.7–7.7)
Neutrophils Relative %: 60 %
Platelet Count: 107 10*3/uL — ABNORMAL LOW (ref 150–400)
RBC: 3.61 MIL/uL — ABNORMAL LOW (ref 4.22–5.81)
RDW: 13.2 % (ref 11.5–15.5)
WBC Count: 6.3 10*3/uL (ref 4.0–10.5)
nRBC: 0 % (ref 0.0–0.2)

## 2018-12-23 MED ORDER — PROCHLORPERAZINE MALEATE 10 MG PO TABS: 10.0000 mg | ORAL_TABLET | Freq: Once | ORAL | Status: DC

## 2018-12-23 MED ORDER — BORTEZOMIB CHEMO SQ INJECTION 3.5 MG (2.5MG/ML)
1.3000 mg/m2 | Freq: Once | INTRAMUSCULAR | Status: AC
  Administered 2018-12-23: 15:00:00 2.5 mg via SUBCUTANEOUS
  Filled 2018-12-23: qty 1

## 2018-12-23 NOTE — Patient Instructions (Signed)
[facility] Discharge Instructions for Patients Receiving Chemotherapy  Today you received the following chemotherapy agents Velcade  To help prevent nausea and vomiting after your treatment, we encourage you to take your nausea medication as directed   If you develop nausea and vomiting that is not controlled by your nausea medication, call the clinic.   BELOW ARE SYMPTOMS THAT SHOULD BE REPORTED IMMEDIATELY:  *FEVER GREATER THAN 100.5 F  *CHILLS WITH OR WITHOUT FEVER  NAUSEA AND VOMITING THAT IS NOT CONTROLLED WITH YOUR NAUSEA MEDICATION  *UNUSUAL SHORTNESS OF BREATH  *UNUSUAL BRUISING OR BLEEDING  TENDERNESS IN MOUTH AND THROAT WITH OR WITHOUT PRESENCE OF ULCERS  *URINARY PROBLEMS  *BOWEL PROBLEMS  UNUSUAL RASH Items with * indicate a potential emergency and should be followed up as soon as possible.  Feel free to call the clinic should you have any questions or concerns. The clinic phone number is [phone].  Please show the [place] at check-in to the Emergency Department and triage nurse.  Coronavirus (COVID-19) Are you at risk?  Are you at risk for the Coronavirus (COVID-19)?  To be considered HIGH RISK for Coronavirus (COVID-19), you have to meet the following criteria:  . Traveled to [place], [place], [place], [place] or [place]; or in the [place] to [place], [place], [place], or [place]; and have fever, cough, and shortness of breath within the last 2 weeks of travel OR . Been in close contact with a person diagnosed with COVID-19 within the last 2 weeks and have fever, cough, and shortness of breath . IF YOU DO NOT MEET THESE CRITERIA, YOU ARE CONSIDERED LOW RISK FOR COVID-19.  What to do if you are HIGH RISK for COVID-19?  [name] If you are having a medical emergency, call 911. . Seek medical care right away. Before you go to a doctor's office, urgent care or emergency department, call ahead and tell them about your recent  travel, contact with someone diagnosed with COVID-19, and your symptoms. You should receive instructions from your physician's office regarding next steps of care.  . When you arrive at healthcare provider, tell the healthcare staff immediately you have returned from visiting [place], [place], [place], [place] or [place]; or traveled in the [place] to [place], [place], [place], or [place]; in the last two weeks or you have been in close contact with a person diagnosed with COVID-19 in the last 2 weeks.   . Tell the health care staff about your symptoms: fever, cough and shortness of breath. . After you have been seen by a medical provider, you will be either: o Tested for (COVID-19) and discharged home on quarantine except to seek medical care if symptoms worsen, and asked to  - Stay home and avoid contact with others until you get your results (4-5 days)  - Avoid travel on public transportation if possible (such as bus, train, or airplane) or o Sent to the Emergency Department by EMS for evaluation, COVID-19 testing, and possible admission depending on your condition and test results.  What to do if you are LOW RISK for COVID-19?  Reduce your risk of any infection by using the same precautions used for avoiding the common cold or flu:  [name] Wash your hands often with soap and warm water for at least 20 seconds.  If soap and water are not readily available, use an alcohol-based hand sanitizer with at least 60% alcohol.  . If coughing or sneezing,  cover your mouth and nose by coughing or sneezing into the elbow areas of your shirt or coat, into a tissue or into your sleeve (not your hands). . Avoid shaking hands with others and consider head nods or verbal greetings only. . Avoid touching your eyes, nose, or mouth with unwashed hands.  . Avoid close contact with people who are sick. . Avoid places or events with large numbers of people in one location, like concerts or sporting  events. . Carefully consider travel plans you have or are making. . If you are planning any travel outside or inside the [place], visit the CDC's Travelers' Health webpage for the latest health notices. . If you have some symptoms but not all symptoms, continue to monitor at home and seek medical attention if your symptoms worsen. . If you are having a medical emergency, call 911.   [place] / e-Visit: [email or web address]         [facility]: [facility]: [phone]                   [name]: [phone]

## 2018-12-23 NOTE — Progress Notes (Signed)
Per Dr. [name], okay to treat pt with CMP from 12/09/2018.  Pt only needs CMP drawn once a month.

## 2019-01-03 ENCOUNTER — Other Ambulatory Visit: Payer: Self-pay | Admitting: Oncology

## 2019-01-06 ENCOUNTER — Inpatient Hospital Stay: Payer: 59

## 2019-01-06 ENCOUNTER — Other Ambulatory Visit: Payer: Self-pay

## 2019-01-06 VITALS — BP 119/72 | HR 72 | Temp 98.1°F | Resp 16 | Ht 69.0 in | Wt 185.4 lb

## 2019-01-06 DIAGNOSIS — Z5112 Encounter for antineoplastic immunotherapy: Secondary | ICD-10-CM | POA: Diagnosis not present

## 2019-01-06 DIAGNOSIS — C9000 Multiple myeloma not having achieved remission: Secondary | ICD-10-CM

## 2019-01-06 DIAGNOSIS — Z9481 Bone marrow transplant status: Secondary | ICD-10-CM

## 2019-01-06 LAB — CBC WITH DIFFERENTIAL (CANCER CENTER ONLY)
Abs Immature Granulocytes: 0.01 10*3/uL (ref 0.00–0.07)
Basophils Absolute: 0 10*3/uL (ref 0.0–0.1)
Basophils Relative: 1 %
Eosinophils Absolute: 0.2 10*3/uL (ref 0.0–0.5)
Eosinophils Relative: 3 %
HCT: 36.1 % — ABNORMAL LOW (ref 39.0–52.0)
Hemoglobin: 12.6 g/dL — ABNORMAL LOW (ref 13.0–17.0)
Immature Granulocytes: 0 %
Lymphocytes Relative: 24 %
Lymphs Abs: 1.4 10*3/uL (ref 0.7–4.0)
MCH: 36.4 pg — ABNORMAL HIGH (ref 26.0–34.0)
MCHC: 34.9 g/dL (ref 30.0–36.0)
MCV: 104.3 fL — ABNORMAL HIGH (ref 80.0–100.0)
Monocytes Absolute: 1 10*3/uL (ref 0.1–1.0)
Monocytes Relative: 17 %
Neutro Abs: 3.4 10*3/uL (ref 1.7–7.7)
Neutrophils Relative %: 55 %
Platelet Count: 114 10*3/uL — ABNORMAL LOW (ref 150–400)
RBC: 3.46 MIL/uL — ABNORMAL LOW (ref 4.22–5.81)
RDW: 13.4 % (ref 11.5–15.5)
WBC Count: 6 10*3/uL (ref 4.0–10.5)
nRBC: 0 % (ref 0.0–0.2)

## 2019-01-06 LAB — CMP (CANCER CENTER ONLY)
ALT: 26 U/L (ref 0–44)
AST: 27 U/L (ref 15–41)
Albumin: 3.6 g/dL (ref 3.5–5.0)
Alkaline Phosphatase: 73 U/L (ref 38–126)
Anion gap: 9 (ref 5–15)
BUN: 35 mg/dL — ABNORMAL HIGH (ref 8–23)
CO2: 21 mmol/L — ABNORMAL LOW (ref 22–32)
Calcium: 9.2 mg/dL (ref 8.9–10.3)
Chloride: 106 mmol/L (ref 98–111)
Creatinine: 1.04 mg/dL (ref 0.61–1.24)
GFR, Est AFR Am: >60 mL/min (ref >60)
GFR, Estimated: >60 mL/min (ref >60)
Glucose, Bld: 84 mg/dL (ref 70–99)
Potassium: 4.6 mmol/L (ref 3.5–5.1)
Sodium: 136 mmol/L (ref 135–145)
Total Bilirubin: 0.4 mg/dL (ref 0.3–1.2)
Total Protein: 7.3 g/dL (ref 6.5–8.1)

## 2019-01-06 MED ORDER — BORTEZOMIB CHEMO SQ INJECTION 3.5 MG (2.5MG/ML)
1.3000 mg/m2 | Freq: Once | INTRAMUSCULAR | Status: AC
  Administered 2019-01-06: 15:00:00 2.5 mg via SUBCUTANEOUS
  Filled 2019-01-06: qty 1

## 2019-01-06 MED ORDER — PROCHLORPERAZINE MALEATE 10 MG PO TABS: 10.0000 mg | ORAL_TABLET | Freq: Once | ORAL | Status: DC

## 2019-01-06 NOTE — Patient Instructions (Signed)
[facility] Discharge Instructions for Patients Receiving Chemotherapy  Today you received the following chemotherapy agents:  bortezomib (Velcade)  To help prevent nausea and vomiting after your treatment, we encourage you to take your nausea medication as prescribed.   If you develop nausea and vomiting that is not controlled by your nausea medication, call the clinic.   BELOW ARE SYMPTOMS THAT SHOULD BE REPORTED IMMEDIATELY:  *FEVER GREATER THAN 100.5 F  *CHILLS WITH OR WITHOUT FEVER  NAUSEA AND VOMITING THAT IS NOT CONTROLLED WITH YOUR NAUSEA MEDICATION  *UNUSUAL SHORTNESS OF BREATH  *UNUSUAL BRUISING OR BLEEDING  TENDERNESS IN MOUTH AND THROAT WITH OR WITHOUT PRESENCE OF ULCERS  *URINARY PROBLEMS  *BOWEL PROBLEMS  UNUSUAL RASH Items with * indicate a potential emergency and should be followed up as soon as possible.  Feel free to call the clinic should you have any questions or concerns. The clinic phone number is [phone].  Please show the CHEMO ALERT CARD at check-in to the Emergency Department and triage nurse.   

## 2019-01-07 LAB — KAPPA/LAMBDA LIGHT CHAINS
Kappa free light chain: 31.6 mg/L — ABNORMAL HIGH (ref 3.3–19.4)
Kappa, lambda light chain ratio: 1.4 (ref 0.26–1.65)
Lambda free light chains: 22.5 mg/L (ref 5.7–26.3)

## 2019-01-07 LAB — PROTEIN ELECTROPHORESIS, SERUM
A/G Ratio: 1.1 (ref 0.7–1.7)
Albumin ELP: 3.3 g/dL (ref 2.9–4.4)
Alpha-1-Globulin: 0.2 g/dL (ref 0.0–0.4)
Alpha-2-Globulin: 0.7 g/dL (ref 0.4–1.0)
Beta Globulin: 1 g/dL (ref 0.7–1.3)
Gamma Globulin: 1.2 g/dL (ref 0.4–1.8)
Globulin, Total: 3.1 g/dL (ref 2.2–3.9)
Total Protein ELP: 6.4 g/dL (ref 6.0–8.5)

## 2019-01-16 ENCOUNTER — Other Ambulatory Visit: Payer: Self-pay | Admitting: *Deleted

## 2019-01-20 ENCOUNTER — Inpatient Hospital Stay: Payer: 59 | Attending: Oncology

## 2019-01-20 ENCOUNTER — Other Ambulatory Visit: Payer: Self-pay

## 2019-01-20 ENCOUNTER — Inpatient Hospital Stay: Payer: 59

## 2019-01-20 VITALS — BP 114/67 | HR 88 | Temp 98.7°F | Resp 18

## 2019-01-20 DIAGNOSIS — E669 Obesity, unspecified: Secondary | ICD-10-CM | POA: Diagnosis not present

## 2019-01-20 DIAGNOSIS — E039 Hypothyroidism, unspecified: Secondary | ICD-10-CM | POA: Insufficient documentation

## 2019-01-20 DIAGNOSIS — Z5112 Encounter for antineoplastic immunotherapy: Secondary | ICD-10-CM | POA: Insufficient documentation

## 2019-01-20 DIAGNOSIS — C9000 Multiple myeloma not having achieved remission: Secondary | ICD-10-CM | POA: Insufficient documentation

## 2019-01-20 DIAGNOSIS — Z9481 Bone marrow transplant status: Secondary | ICD-10-CM

## 2019-01-20 DIAGNOSIS — Z87891 Personal history of nicotine dependence: Secondary | ICD-10-CM | POA: Insufficient documentation

## 2019-01-20 DIAGNOSIS — Z79899 Other long term (current) drug therapy: Secondary | ICD-10-CM | POA: Insufficient documentation

## 2019-01-20 LAB — CBC WITH DIFFERENTIAL (CANCER CENTER ONLY)
Abs Immature Granulocytes: 0.02 10*3/uL (ref 0.00–0.07)
Basophils Absolute: 0 10*3/uL (ref 0.0–0.1)
Basophils Relative: 0 %
Eosinophils Absolute: 0.1 10*3/uL (ref 0.0–0.5)
Eosinophils Relative: 2 %
HCT: 39.7 % (ref 39.0–52.0)
Hemoglobin: 13.6 g/dL (ref 13.0–17.0)
Immature Granulocytes: 0 %
Lymphocytes Relative: 23 %
Lymphs Abs: 1.6 10*3/uL (ref 0.7–4.0)
MCH: 36.6 pg — ABNORMAL HIGH (ref 26.0–34.0)
MCHC: 34.3 g/dL (ref 30.0–36.0)
MCV: 106.7 fL — ABNORMAL HIGH (ref 80.0–100.0)
Monocytes Absolute: 1.1 10*3/uL — ABNORMAL HIGH (ref 0.1–1.0)
Monocytes Relative: 16 %
Neutro Abs: 3.9 10*3/uL (ref 1.7–7.7)
Neutrophils Relative %: 59 %
Platelet Count: 110 10*3/uL — ABNORMAL LOW (ref 150–400)
RBC: 3.72 MIL/uL — ABNORMAL LOW (ref 4.22–5.81)
RDW: 13.7 % (ref 11.5–15.5)
WBC Count: 6.7 10*3/uL (ref 4.0–10.5)
nRBC: 0 % (ref 0.0–0.2)

## 2019-01-20 MED ORDER — BORTEZOMIB CHEMO SQ INJECTION 3.5 MG (2.5MG/ML)
1.3000 mg/m2 | Freq: Once | INTRAMUSCULAR | Status: AC
  Administered 2019-01-20: 15:00:00 2.5 mg via SUBCUTANEOUS
  Filled 2019-01-20: qty 1

## 2019-01-20 MED ORDER — PROCHLORPERAZINE MALEATE 10 MG PO TABS: 10.0000 mg | ORAL_TABLET | Freq: Once | ORAL | Status: DC

## 2019-01-20 NOTE — Patient Instructions (Signed)
[facility] Discharge Instructions for Patients Receiving Chemotherapy  Today you received the following chemotherapy agents:  bortezomib (Velcade)  To help prevent nausea and vomiting after your treatment, we encourage you to take your nausea medication as prescribed.   If you develop nausea and vomiting that is not controlled by your nausea medication, call the clinic.   BELOW ARE SYMPTOMS THAT SHOULD BE REPORTED IMMEDIATELY:  *FEVER GREATER THAN 100.5 F  *CHILLS WITH OR WITHOUT FEVER  NAUSEA AND VOMITING THAT IS NOT CONTROLLED WITH YOUR NAUSEA MEDICATION  *UNUSUAL SHORTNESS OF BREATH  *UNUSUAL BRUISING OR BLEEDING  TENDERNESS IN MOUTH AND THROAT WITH OR WITHOUT PRESENCE OF ULCERS  *URINARY PROBLEMS  *BOWEL PROBLEMS  UNUSUAL RASH Items with * indicate a potential emergency and should be followed up as soon as possible.  Feel free to call the clinic should you have any questions or concerns. The clinic phone number is [phone].  Please show the [facility] at check-in to the Emergency Department and triage nurse.  This information is directly available on the CDC website: RunningShows.co.za.html    Source:CDC Reference to specific commercial products, manufacturers, companies, or trademarks does not constitute its endorsement or recommendation by the [place], [place], or Centers for [employer] and Prevention.

## 2019-02-03 ENCOUNTER — Other Ambulatory Visit: Payer: Self-pay

## 2019-02-03 ENCOUNTER — Inpatient Hospital Stay: Payer: 59

## 2019-02-03 ENCOUNTER — Inpatient Hospital Stay (HOSPITAL_BASED_OUTPATIENT_CLINIC_OR_DEPARTMENT_OTHER): Payer: 59 | Admitting: Oncology

## 2019-02-03 VITALS — BP 109/64 | HR 80 | Temp 98.2°F | Resp 18 | Ht 69.0 in | Wt 185.3 lb

## 2019-02-03 DIAGNOSIS — Z9481 Bone marrow transplant status: Secondary | ICD-10-CM

## 2019-02-03 DIAGNOSIS — I10 Essential (primary) hypertension: Secondary | ICD-10-CM

## 2019-02-03 DIAGNOSIS — C9000 Multiple myeloma not having achieved remission: Secondary | ICD-10-CM

## 2019-02-03 DIAGNOSIS — Z5112 Encounter for antineoplastic immunotherapy: Secondary | ICD-10-CM | POA: Diagnosis not present

## 2019-02-03 DIAGNOSIS — Z79899 Other long term (current) drug therapy: Secondary | ICD-10-CM

## 2019-02-03 DIAGNOSIS — E039 Hypothyroidism, unspecified: Secondary | ICD-10-CM

## 2019-02-03 DIAGNOSIS — Z87891 Personal history of nicotine dependence: Secondary | ICD-10-CM

## 2019-02-03 LAB — CBC WITH DIFFERENTIAL (CANCER CENTER ONLY)
Abs Immature Granulocytes: 0.01 10*3/uL (ref 0.00–0.07)
Basophils Absolute: 0 10*3/uL (ref 0.0–0.1)
Basophils Relative: 0 %
Eosinophils Absolute: 0.1 10*3/uL (ref 0.0–0.5)
Eosinophils Relative: 2 %
HCT: 37.8 % — ABNORMAL LOW (ref 39.0–52.0)
Hemoglobin: 13.1 g/dL (ref 13.0–17.0)
Immature Granulocytes: 0 %
Lymphocytes Relative: 23 %
Lymphs Abs: 1.3 10*3/uL (ref 0.7–4.0)
MCH: 36.5 pg — ABNORMAL HIGH (ref 26.0–34.0)
MCHC: 34.7 g/dL (ref 30.0–36.0)
MCV: 105.3 fL — ABNORMAL HIGH (ref 80.0–100.0)
Monocytes Absolute: 0.9 10*3/uL (ref 0.1–1.0)
Monocytes Relative: 16 %
Neutro Abs: 3.3 10*3/uL (ref 1.7–7.7)
Neutrophils Relative %: 59 %
Platelet Count: 110 10*3/uL — ABNORMAL LOW (ref 150–400)
RBC: 3.59 MIL/uL — ABNORMAL LOW (ref 4.22–5.81)
RDW: 13.7 % (ref 11.5–15.5)
WBC Count: 5.6 10*3/uL (ref 4.0–10.5)
nRBC: 0 % (ref 0.0–0.2)

## 2019-02-03 LAB — CMP (CANCER CENTER ONLY)
ALT: 22 U/L (ref 0–44)
AST: 25 U/L (ref 15–41)
Albumin: 3.8 g/dL (ref 3.5–5.0)
Alkaline Phosphatase: 79 U/L (ref 38–126)
Anion gap: 8 (ref 5–15)
BUN: 35 mg/dL — ABNORMAL HIGH (ref 8–23)
CO2: 24 mmol/L (ref 22–32)
Calcium: 9.6 mg/dL (ref 8.9–10.3)
Chloride: 107 mmol/L (ref 98–111)
Creatinine: 1.05 mg/dL (ref 0.61–1.24)
GFR, Est AFR Am: >60 mL/min (ref >60)
GFR, Estimated: >60 mL/min (ref >60)
Glucose, Bld: 91 mg/dL (ref 70–99)
Potassium: 4.6 mmol/L (ref 3.5–5.1)
Sodium: 139 mmol/L (ref 135–145)
Total Bilirubin: 0.3 mg/dL (ref 0.3–1.2)
Total Protein: 7.5 g/dL (ref 6.5–8.1)

## 2019-02-03 MED ORDER — BORTEZOMIB CHEMO SQ INJECTION 3.5 MG (2.5MG/ML)
1.3000 mg/m2 | Freq: Once | INTRAMUSCULAR | Status: AC
  Administered 2019-02-03: 2.5 mg via SUBCUTANEOUS
  Filled 2019-02-03: qty 1

## 2019-02-03 MED ORDER — PROCHLORPERAZINE MALEATE 10 MG PO TABS: 10.0000 mg | ORAL_TABLET | Freq: Once | ORAL | Status: DC

## 2019-02-03 NOTE — Patient Instructions (Signed)
Coronavirus (COVID-19) Are you at risk?  Are you at risk for the Coronavirus (COVID-19)?  To be considered HIGH RISK for Coronavirus (COVID-19), you have to meet the following criteria:  . Traveled to [place], [place], [place], [place] or [place]; or in the [place] to [place], [place], [place], or [place]; and have fever, cough, and shortness of breath within the last 2 weeks of travel OR . Been in close contact with a person diagnosed with COVID-19 within the last 2 weeks and have fever, cough, and shortness of breath . IF YOU DO NOT MEET THESE CRITERIA, YOU ARE CONSIDERED LOW RISK FOR COVID-19.  What to do if you are HIGH RISK for COVID-19?  . If you are having a medical emergency, call 911. . Seek medical care right away. Before you go to a doctor's office, urgent care or emergency department, call ahead and tell them about your recent travel, contact with someone diagnosed with COVID-19, and your symptoms. You should receive instructions from your physician's office regarding next steps of care.  . When you arrive at healthcare provider, tell the healthcare staff immediately you have returned from visiting [place], [place], [place], [place] or [place]; or traveled in the [place] to [place], [place], [place], or [place]; in the last two weeks or you have been in close contact with a person diagnosed with COVID-19 in the last 2 weeks.   . Tell the health care staff about your symptoms: fever, cough and shortness of breath. . After you have been seen by a medical provider, you will be either: o Tested for (COVID-19) and discharged home on quarantine except to seek medical care if symptoms worsen, and asked to  - Stay home and avoid contact with others until you get your results (4-5 days)  - Avoid travel on public transportation if possible (such as bus, train, or airplane) or o Sent to the Emergency Department by EMS for evaluation, COVID-19 testing, and possible  admission depending on your condition and test results.  What to do if you are LOW RISK for COVID-19?  Reduce your risk of any infection by using the same precautions used for avoiding the common cold or flu:  . Wash your hands often with soap and warm water for at least 20 seconds.  If soap and water are not readily available, use an alcohol-based hand sanitizer with at least 60% alcohol.  . If coughing or sneezing, cover your mouth and nose by coughing or sneezing into the elbow areas of your shirt or coat, into a tissue or into your sleeve (not your hands). . Avoid shaking hands with others and consider head nods or verbal greetings only. . Avoid touching your eyes, nose, or mouth with unwashed hands.  . Avoid close contact with people who are sick. . Avoid places or events with large numbers of people in one location, like concerts or sporting events. . Carefully consider travel plans you have or are making. . If you are planning any travel outside or inside the [place], visit the CDC's Travelers' Health webpage for the latest health notices. . If you have some symptoms but not all symptoms, continue to monitor at home and seek medical attention if your symptoms worsen. . If you are having a medical emergency, call 911.   ADDITIONAL HEALTHCARE OPTIONS FOR PATIENTS  [facility] / e-Visit: [email or web address]         [facility]: [phone]     Urgent Care: [phone]                   [facility]: [phone]    [facility] Discharge Instructions for Patients Receiving Chemotherapy  Today you received the following chemotherapy agents Velcade  To help prevent nausea and vomiting after your treatment, we encourage you to take your nausea medication as directed   If you develop nausea and vomiting that is not controlled by your nausea medication, call the clinic.   BELOW ARE  SYMPTOMS THAT SHOULD BE REPORTED IMMEDIATELY:  *FEVER GREATER THAN 100.5 F  *CHILLS WITH OR WITHOUT FEVER  NAUSEA AND VOMITING THAT IS NOT CONTROLLED WITH YOUR NAUSEA MEDICATION  *UNUSUAL SHORTNESS OF BREATH  *UNUSUAL BRUISING OR BLEEDING  TENDERNESS IN MOUTH AND THROAT WITH OR WITHOUT PRESENCE OF ULCERS  *URINARY PROBLEMS  *BOWEL PROBLEMS  UNUSUAL RASH Items with * indicate a potential emergency and should be followed up as soon as possible.  Feel free to call the clinic should you have any questions or concerns. The clinic phone number is [phone].  Please show the CHEMO ALERT CARD at check-in to the Emergency Department and triage nurse.   

## 2019-02-03 NOTE — Progress Notes (Signed)
[facility]  [phone] [phone]) [phone]    ID: Andre [name]  DOB: Sep 15, 1951  MR#: [id number] CSN#: [id number]   Patient Care Team: Cassandria Anger, MD as PCP - General (Internal Medicine) Valerio Pinard, [name], MD as Consulting Physician (Oncology) Concepcion Living, MD as Consulting Physician (Neurology) [name], MD as Consulting Physician (Internal Medicine) Kathrynn Ducking, MD as Consulting Physician (Neurology) Dorna Bloom, DDS as Referring Physician (Oral Surgery) OTHERS: Augustina Mood DDS   CHIEF COMPLAINT: Multiple myeloma  CURRENT TREATMENT: Maintenance therapy with bortezomib, zoledronate   INTERVAL HISTORY:  Andre [name] returns today for follow-up and treatment of his multiple myeloma.  He continues on bortezomid every 14 days. He denies any rashes. He reports mouth sores, which are improved with valtrex. He also had a low-grade fever lasting no more than 48 hours.   He also receives zolendronate every 12 weeks, with his most recent dose 12/09/2018. He feels like he has the flu following this treatment, which he treated with Advil, nausea medicine, and tylenol.   Since his last visit here, he has not undergone any additional studies.    REVIEW OF SYSTEMS: Andre [name] reports doing well overall. His wife, Andre [name], does the shopping. He and [name] walk for exercise. He states he has not gotten back into strength training. The patient denies unusual headaches, visual changes, nausea, vomiting, stiff neck, dizziness, or gait imbalance. There has been no cough, phlegm production, or pleurisy, no chest pain or pressure, and no change in bowel or bladder habits. The patient denies fever, rash, bleeding, unexplained fatigue or unexplained weight loss. A detailed review of systems was otherwise entirely negative.   HISTORY OF PRESENT ILLNESS:  From the original intake nodes:  Andre [name] was worked up for peptic ulcer disease in  August of 2011, with significant bleeding and anemia.  The patient was Helicobacter pylori negative.  He received some epinephrine when he had his EGD and then started on Protonix.  [name] anemia slowly resolved so that by September, his hemoglobin was up to 10.9 and by earlier this month, his hemoglobin was up to 12.5.    As part of his general workup, he was found to have a slightly elevated globulin fraction.  In September, his total protein was 8.3 with an albumin of 3.8.  In January, the total protein was 8.4 and albumin 3.6.  With persistence of this slight abnormality, Dr. Olevia Perches obtained serum immunofixation and SPEP.  The SPTP showed an M-spike of 2.67 grams.  A total IgG was 4,190.  Total IgA low at 28.  Total IgM low at 28 also.  The immunofixation showed a monoclonal IgG lambda paraprotein.  There were also monoclonal free lambda light chains present. With this information, the patient was referred for further evaluation.   A diagnosis of myeloma was confirmed by bone marrow biopsy and subsequebnt treatment is as detailed below   PAST MEDICAL HISTORY: Past Medical History:  Diagnosis Date  . Allergy   . Anemia   . Arthritis   . Asthma    no treatment x 20 years  . Depression   . Double vision    occurs at times   . Duodenal ulcer   . GERD (gastroesophageal reflux disease)   . Hyperlipidemia   . Hypertension   . Hypothyroidism   . Multiple myeloma 07/04/2011  . Thyroid disease    Significant for peptic ulcer disease as noted above.  History of hyperlipidemia.  History  of anxiety and depression.  History of GERD which is significantly improved with weight loss and history of reactive airway disease, possibly secondary to the GERD which also improved with weight loss.  There was a history of obesity, now much improved secondary to exercise and diet.    PAST SURGICAL HISTORY: Past Surgical History:  Procedure Laterality Date  . BONE MARROW TRANSPLANT  2011   for MM  .  CARDIOLITE STUDY  11/25/2003   NORMAL  . TONSILLECTOMY       FAMILY HISTORY: Family History  Problem Relation Age of Onset  . Throat cancer Mother   . Hypertension Father   . Stroke Father   . Asthma Father   . Diabetes Father   . Pancreatic cancer Brother    [name] father died at the age of 32 following a stroke.  [name] mother died with cancer of the throat at the age of 29.   She had a history of depression and was a smoker.  Not clear how much alcohol she drank, according to the patient.  The patient had two brothers.  One died from the age of 55 from a "kidney problem."  The second one died at the age of 72 from pancreatic cancer. (This may have been duodenal cancer. 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  His wife of 37+ years was [name], but they divorced. In 2016 he remarried, his current wife's name is [name].  The patient has two daughters:  [name], who works in [place], who had her second child April 2016. Both daughters live here in [place]. The patient is very close to his daughters.  Virgil first daughter, Andre [name], born 03/06/2013, apparently has Weber/Osler/Rendu. The patient is not a church attender.  [name] also participates in [place] even though actually there is no alcohol or drug problem in the family.  He participates in this group and he gets quite a bit of support from it.     ADVANCED DIRECTIVES: In place   HEALTH MAINTENANCE: (updated 07/16/2013) Social History   Tobacco Use  . Smoking status: Former Smoker    Years: 3.00    Last attempt to quit: 03/27/1969    Years since quitting: 49.8  . Smokeless tobacco: Never Used  Substance Use Topics  . Alcohol use: No  . Drug use: No    Colonoscopy:  PSA:  Sept 2014, normal/Dr. [name]  Lipid panel: Sept 2014/Dr.  [name]  Allergies  Allergen Reactions  . Atorvastatin Other (See Comments)  . Rosuvastatin     Other reaction(s): Liver Disorder  . Crestor [Rosuvastatin Calcium]     ADVERSE EFFECTS ON LIVER  . Lipitor [Atorvastatin Calcium]   . Septra [Sulfamethoxazole-Trimethoprim] Rash   Current Outpatient Medications  Medication Sig Dispense Refill  . Yannis complex vitamins tablet Take 1 tablet by mouth daily. 100 tablet 3  . cetirizine (ZYRTEC) 10 MG tablet Take 10 mg by mouth daily.    . Cholecalciferol (VITAMIN D3) 2000 units capsule Take 1 capsule (2,000 Units total) by mouth daily. 100 capsule 3  . docusate sodium (COLACE) 100 MG capsule Take 100 mg by mouth daily as needed for mild constipation or moderate constipation.    [name] escitalopram (LEXAPRO) 10 MG tablet TAKE 1 TABLET BY MOUTH  DAILY 90 tablet 3  .  feeding supplement, ENSURE ENLIVE, (ENSURE ENLIVE) LIQD Take 237 mLs by mouth 2 (two) times daily between meals. 237 mL 12  . levothyroxine (SYNTHROID, LEVOTHROID) 150 MCG tablet TAKE 1 TABLET BY MOUTH  EVERY MORNING ON AN EMPTY  STOMACH 90 tablet 3  . ondansetron (ZOFRAN) 8 MG tablet TAKE 1 TABLET BY MOUTH EVERY 8 HOURS AS NEEDED FOR NAUSEA OR VOMITING 20 tablet 0  . pantoprazole (PROTONIX) 40 MG tablet TAKE 1 TABLET BY MOUTH  TWICE A DAY BEFORE MEALS 180 tablet 3  . sildenafil (VIAGRA) 25 MG tablet Take 1 tablet (25 mg total) by mouth daily as needed for erectile dysfunction. 10 tablet 0  . triamcinolone cream (KENALOG) 0.5 % Apply 1 application topically 4 (four) times daily. 60 g 2  . valACYclovir (VALTREX) 1000 MG tablet TAKE 1 TABLET BY MOUTH  DAILY 90 tablet 3  . zolendronic acid (ZOMETA) 4 MG/5ML injection Inject 4 mg into the vein every 3 (three) months.     No current facility-administered medications for this visit.    Objective: Middle-aged white man who appears well  Vitals:   02/03/19 1416  BP: 109/64  Pulse: 80  Resp: 18  Temp: 98.2 F (36.8 C)  SpO2: 100%  Body mass  index is 27.36 kg/m. Filed Weights   02/03/19 1416  Weight: 185 lb 4.8 oz (84.1 kg)   Sclerae unicteric, EOMs intact Wearing a mask No cervical or supraclavicular adenopathy Lungs no rales or rhonchi Heart regular rate and rhythm Abd soft, nontender, positive bowel sounds MSK no focal spinal tenderness Neuro: nonfocal, well oriented, appropriate affect   LAB RESULTS:  Appointment on 02/03/2019  Component Date Value Ref Range Status  . WBC Count 02/03/2019 5.6  4.0 - 10.5 K/uL Final  . RBC 02/03/2019 3.59* 4.22 - 5.81 MIL/uL Final  . Hemoglobin 02/03/2019 13.1  13.0 - 17.0 g/dL Final  . HCT 02/03/2019 37.8* 39.0 - 52.0 % Final  . MCV 02/03/2019 105.3* 80.0 - 100.0 fL Final  . MCH 02/03/2019 36.5* 26.0 - 34.0 pg Final  . MCHC 02/03/2019 34.7  30.0 - 36.0 g/dL Final  . RDW 02/03/2019 13.7  11.5 - 15.5 % Final  . Platelet Count 02/03/2019 110* 150 - 400 K/uL Final  . nRBC 02/03/2019 0.0  0.0 - 0.2 % Final  . Neutrophils Relative % 02/03/2019 59  % Final  . Neutro Abs 02/03/2019 3.3  1.7 - 7.7 K/uL Final  . Lymphocytes Relative 02/03/2019 23  % Final  . Lymphs Abs 02/03/2019 1.3  0.7 - 4.0 K/uL Final  . Monocytes Relative 02/03/2019 16  % Final  . Monocytes Absolute 02/03/2019 0.9  0.1 - 1.0 K/uL Final  . Eosinophils Relative 02/03/2019 2  % Final  . Eosinophils Absolute 02/03/2019 0.1  0.0 - 0.5 K/uL Final  . Basophils Relative 02/03/2019 0  % Final  . Basophils Absolute 02/03/2019 0.0  0.0 - 0.1 K/uL Final  . Immature Granulocytes 02/03/2019 0  % Final  . Abs Immature Granulocytes 02/03/2019 0.01  0.00 - 0.07 K/uL Final   Performed at [facility], [place] [address], [place], [place] [address]    STUDIES: SPEP and light chain studies reviewed  ASSESSMENT: 68 y.o. [place] man with a history of multiple myeloma diagnosed February of 2012, with an initial M-spike of 2.6 g/dL, IFE showing an IgG lambda paraprotein and free lambda light chains in the  urine. Cytogenetics showed trisomy 2. Bone marrow biopsy showed a 22% plasmacytosis.  Treated with   (1) bortezomib (subcutaneously), lenalidomide, and dexamethasone, with repeat bone marrow biopsy May of 2012 showing 10% plasmacytosis   (2) high-dose chemotherapy with BCNU and melphalan at [facility], followed by stem cell rescue July of 2012   (3) on zoledronic acid started December of 2012, initially monthly, currently given every 3 months, most recent dose  12/07/2015  (4) low-dose lenalidomide resumed April 2013, interrupted several times.  Resumed again on 02/19/2013, ata dose of 5 mg daily, 21 days on and 7 days off, later further reduced to 7 days on, 7 days off  (5) CNS symptoms and abnormal brain MRI extensively evaluated by neurology with no definitive diagnosis established  (6) rising M spike noted June 2014 but did not meet criteria for carfilzomib study  (7) on lenalidomide 25 mg daily, 14 days on, 7 days off, starting 04/18/2013, interrupted December 2014 because of rash;   (a) resumed January 2015 at 10 mg/ day at 21 days on/ 7 days off  ([place]) starting 08/18/2014 decreased to 10 mg/ day 14 days on, 7 days off because of cytopenias  (c) as of February 2016 was on 5 mg daily 7 days on 7 days off  (d) lenalidomide discontinued December 2016 with evidence of disease progression  (8) transient global amnesia 05/29/2015, resolved without intervention  (9) starting PVD 10/25/2015 w ASA 325 thromboprophylaxis, valacyclovir prophylaxis, last dose 12/17/2015  (a) pomalidomide 4 mg/d days 1-14  ([place]) bortezomib sQ days 2,5,9,12 of each 21 day cycle  (c) dexamethasome 20 mg two days a week  (d) dexamethasone bortezomib and pomalidomide discontinued late December 2018 with poor tolerance  (10) metapneumovirus pneumonia April 2017  (a) completing course of steroids and week of bactrim mid April 2017  (11) status post second autologous transplant at [facility] 02/04/2016(preparatory regimen melphalan  200 mg/m)  (a) received twelve-month vaccinations 03/14/2017 (DPT, Haemophilus, Pneumovax 13, polio)  (Lamin) 14 months injections 05/04/2016 include DTaP, Hib conjugate, HepB energex Jarad 20 mcg/ml, Prevnar 13  (c) 24 month vaccines due at [facility] June 2019  (12) maintenance therapy started November 2017, consisting of  (a) bortezomib 1.3 mg/M2 every 14 days, first dose 07/27/2016  ([place]y) pomalidomide 1 mg days 1-21 Q28 days, started 07/19/2016  (c) zolendronate monthly started 07/27/2016 (previously Q 3 months) however patient unable to tolerate, and changed back to q3 month in April, 2018   (d) Bortezomib changed to monthly as of June 2018 because of tolerance issues, however discontinued after 09/14/2017 dose because of a rise in his M spike  (e) pomalidomide held after 10/18/2017 in preparation for possible study at [facility] (venetoclax)--never resumed  (f) with numbers actually improved off treatment, resumed every 2-week bortezomib 12/25/2017    PLAN:  Andre [name] is now 3 years out from his second autologous transplant at [facility].  His myeloma is very well controlled.  He has some symptoms from his treatments but he tolerates them well and the plan is to continue the every 2-week bortezomib so long as there is evidence of response.  In fact his M spike was not detectable last time we checked a month ago and his kappa/lambda light chain ratio was almost normal  He is thinking of retiring in December and switching to Medicare.  He does need to carefully assist with various options for insurance will have at that time since at some point we very likely will need to change treatment and there are many very expensive treatments now available for myeloma.  He will want  to make sure that all that is covered  He is taking appropriate pandemic precautions.  He will have his next zoledronate dose June 15.  He will see me again sometime late August.  He knows to call for any other issue that may develop before that visit.   Ell Tiso, [name], MD  02/03/19 2:35 PM Medical Oncology and Hematology [facility] [address] [place], [place] [address] Tel. [phone]    Fax. [phone]    I, Wilburn Mylar, am acting as scribe for Dr. [name]. Deane Melick.  I, Lurline Del MD, have reviewed the above documentation for accuracy and completeness, and I agree with the above.

## 2019-02-04 LAB — KAPPA/LAMBDA LIGHT CHAINS
Kappa free light chain: 31 mg/L — ABNORMAL HIGH (ref 3.3–19.4)
Kappa, lambda light chain ratio: 1.38 (ref 0.26–1.65)
Lambda free light chains: 22.5 mg/L (ref 5.7–26.3)

## 2019-02-04 LAB — PROTEIN ELECTROPHORESIS, SERUM
A/G Ratio: 1.2 (ref 0.7–1.7)
Albumin ELP: 3.6 g/dL (ref 2.9–4.4)
Alpha-1-Globulin: 0.2 g/dL (ref 0.0–0.4)
Alpha-2-Globulin: 0.7 g/dL (ref 0.4–1.0)
Beta Globulin: 1 g/dL (ref 0.7–1.3)
Gamma Globulin: 1.2 g/dL (ref 0.4–1.8)
Globulin, Total: 3.1 g/dL (ref 2.2–3.9)
M-Spike, %: 0.3 g/dL — ABNORMAL HIGH
Total Protein ELP: 6.7 g/dL (ref 6.0–8.5)

## 2019-02-14 ENCOUNTER — Telehealth: Payer: Self-pay | Admitting: Oncology

## 2019-02-14 NOTE — Telephone Encounter (Signed)
Scheduled appt per 5/29 sch message - unable to reach patient . Left message with appt date and time

## 2019-02-17 ENCOUNTER — Other Ambulatory Visit: Payer: Self-pay

## 2019-02-17 ENCOUNTER — Inpatient Hospital Stay: Payer: 59

## 2019-02-17 ENCOUNTER — Inpatient Hospital Stay: Payer: 59 | Attending: Oncology

## 2019-02-17 ENCOUNTER — Other Ambulatory Visit: Payer: Self-pay | Admitting: Oncology

## 2019-02-17 VITALS — BP 120/75 | HR 81 | Temp 98.7°F | Resp 18

## 2019-02-17 DIAGNOSIS — Z9481 Bone marrow transplant status: Secondary | ICD-10-CM

## 2019-02-17 DIAGNOSIS — Z5112 Encounter for antineoplastic immunotherapy: Secondary | ICD-10-CM | POA: Insufficient documentation

## 2019-02-17 DIAGNOSIS — C9000 Multiple myeloma not having achieved remission: Secondary | ICD-10-CM

## 2019-02-17 LAB — CBC WITH DIFFERENTIAL (CANCER CENTER ONLY)
Abs Immature Granulocytes: 0.01 10*3/uL (ref 0.00–0.07)
Basophils Absolute: 0 10*3/uL (ref 0.0–0.1)
Basophils Relative: 0 %
Eosinophils Absolute: 0.1 10*3/uL (ref 0.0–0.5)
Eosinophils Relative: 2 %
HCT: 37 % — ABNORMAL LOW (ref 39.0–52.0)
Hemoglobin: 12.6 g/dL — ABNORMAL LOW (ref 13.0–17.0)
Immature Granulocytes: 0 %
Lymphocytes Relative: 25 %
Lymphs Abs: 1.6 10*3/uL (ref 0.7–4.0)
MCH: 36.1 pg — ABNORMAL HIGH (ref 26.0–34.0)
MCHC: 34.1 g/dL (ref 30.0–36.0)
MCV: 106 fL — ABNORMAL HIGH (ref 80.0–100.0)
Monocytes Absolute: 1.2 10*3/uL — ABNORMAL HIGH (ref 0.1–1.0)
Monocytes Relative: 19 %
Neutro Abs: 3.3 10*3/uL (ref 1.7–7.7)
Neutrophils Relative %: 54 %
Platelet Count: 106 10*3/uL — ABNORMAL LOW (ref 150–400)
RBC: 3.49 MIL/uL — ABNORMAL LOW (ref 4.22–5.81)
RDW: 13.5 % (ref 11.5–15.5)
WBC Count: 6.3 10*3/uL (ref 4.0–10.5)
nRBC: 0 % (ref 0.0–0.2)

## 2019-02-17 MED ORDER — PROCHLORPERAZINE MALEATE 10 MG PO TABS: 10.0000 mg | ORAL_TABLET | Freq: Once | ORAL | Status: DC

## 2019-02-17 MED ORDER — BORTEZOMIB CHEMO SQ INJECTION 3.5 MG (2.5MG/ML)
1.3000 mg/m2 | Freq: Once | INTRAMUSCULAR | Status: AC
  Administered 2019-02-17: 2.5 mg via SUBCUTANEOUS
  Filled 2019-02-17: qty 1

## 2019-02-17 NOTE — Patient Instructions (Signed)
Coronavirus (COVID-19) Are you at risk?  Are you at risk for the Coronavirus (COVID-19)?  To be considered HIGH RISK for Coronavirus (COVID-19), you have to meet the following criteria:  . Traveled to [place], [place], [place], [place] or [place]; or in the [place] to [place], [place], [place], or [place]; and have fever, cough, and shortness of breath within the last 2 weeks of travel OR . Been in close contact with a person diagnosed with COVID-19 within the last 2 weeks and have fever, cough, and shortness of breath . IF YOU DO NOT MEET THESE CRITERIA, YOU ARE CONSIDERED LOW RISK FOR COVID-19.  What to do if you are HIGH RISK for COVID-19?  . If you are having a medical emergency, call 911. . Seek medical care right away. Before you go to a doctor's office, urgent care or emergency department, call ahead and tell them about your recent travel, contact with someone diagnosed with COVID-19, and your symptoms. You should receive instructions from your physician's office regarding next steps of care.  . When you arrive at healthcare provider, tell the healthcare staff immediately you have returned from visiting [place], [place], [place], [place] or [place]; or traveled in the [place] to [place], [place], [place], or [place]; in the last two weeks or you have been in close contact with a person diagnosed with COVID-19 in the last 2 weeks.   . Tell the health care staff about your symptoms: fever, cough and shortness of breath. . After you have been seen by a medical provider, you will be either: o Tested for (COVID-19) and discharged home on quarantine except to seek medical care if symptoms worsen, and asked to  - Stay home and avoid contact with others until you get your results (4-5 days)  - Avoid travel on public transportation if possible (such as bus, train, or airplane) or o Sent to the Emergency Department by EMS for evaluation, COVID-19 testing, and possible  admission depending on your condition and test results.  What to do if you are LOW RISK for COVID-19?  Reduce your risk of any infection by using the same precautions used for avoiding the common cold or flu:  . Wash your hands often with soap and warm water for at least 20 seconds.  If soap and water are not readily available, use an alcohol-based hand sanitizer with at least 60% alcohol.  . If coughing or sneezing, cover your mouth and nose by coughing or sneezing into the elbow areas of your shirt or coat, into a tissue or into your sleeve (not your hands). . Avoid shaking hands with others and consider head nods or verbal greetings only. . Avoid touching your eyes, nose, or mouth with unwashed hands.  . Avoid close contact with people who are sick. . Avoid places or events with large numbers of people in one location, like concerts or sporting events. . Carefully consider travel plans you have or are making. . If you are planning any travel outside or inside the [place], visit the CDC's Travelers' Health webpage for the latest health notices. . If you have some symptoms but not all symptoms, continue to monitor at home and seek medical attention if your symptoms worsen. . If you are having a medical emergency, call 911.   ADDITIONAL HEALTHCARE OPTIONS FOR PATIENTS  [facility] / e-Visit: [email or web address]         [facility]: [phone]  [place]: [phone]                   [facility]: [phone]    [facility] Discharge Instructions for Patients Receiving Chemotherapy  Today you received the following chemotherapy agents Velcade  To help prevent nausea and vomiting after your treatment, we encourage you to take your nausea medication as directed   If you develop nausea and vomiting that is not controlled by your nausea medication, call the clinic.   BELOW ARE  SYMPTOMS THAT SHOULD BE REPORTED IMMEDIATELY:  *FEVER GREATER THAN 100.5 F  *CHILLS WITH OR WITHOUT FEVER  NAUSEA AND VOMITING THAT IS NOT CONTROLLED WITH YOUR NAUSEA MEDICATION  *UNUSUAL SHORTNESS OF BREATH  *UNUSUAL BRUISING OR BLEEDING  TENDERNESS IN MOUTH AND THROAT WITH OR WITHOUT PRESENCE OF ULCERS  *URINARY PROBLEMS  *BOWEL PROBLEMS  UNUSUAL RASH Items with * indicate a potential emergency and should be followed up as soon as possible.  Feel free to call the clinic should you have any questions or concerns. The clinic phone number is [phone].  Please show the CHEMO ALERT CARD at check-in to the Emergency Department and triage nurse.   

## 2019-02-18 ENCOUNTER — Ambulatory Visit: Payer: 59 | Admitting: Internal Medicine

## 2019-02-25 ENCOUNTER — Ambulatory Visit (INDEPENDENT_AMBULATORY_CARE_PROVIDER_SITE_OTHER): Payer: 59 | Admitting: Internal Medicine

## 2019-02-25 ENCOUNTER — Encounter: Payer: Self-pay | Admitting: Internal Medicine

## 2019-02-25 DIAGNOSIS — E559 Vitamin D deficiency, unspecified: Secondary | ICD-10-CM | POA: Diagnosis not present

## 2019-02-25 DIAGNOSIS — G459 Transient cerebral ischemic attack, unspecified: Secondary | ICD-10-CM | POA: Diagnosis not present

## 2019-02-25 DIAGNOSIS — R531 Weakness: Secondary | ICD-10-CM | POA: Diagnosis not present

## 2019-02-25 DIAGNOSIS — C9000 Multiple myeloma not having achieved remission: Secondary | ICD-10-CM

## 2019-02-25 DIAGNOSIS — E039 Hypothyroidism, unspecified: Secondary | ICD-10-CM | POA: Diagnosis not present

## 2019-02-25 DIAGNOSIS — K219 Gastro-esophageal reflux disease without esophagitis: Secondary | ICD-10-CM

## 2019-02-25 MED ORDER — FAMOTIDINE 40 MG PO TABS: 40.0000 mg | ORAL_TABLET | Freq: Every day | ORAL | 3 refills | Status: DC

## 2019-02-25 NOTE — Assessment & Plan Note (Signed)
Worse. Added Pepcid Barium swallow if problems

## 2019-02-25 NOTE — Assessment & Plan Note (Signed)
No relapse 

## 2019-02-25 NOTE — Assessment & Plan Note (Signed)
Vit D 

## 2019-02-25 NOTE — Progress Notes (Signed)
Virtual Visit via Video Note  I connected with Andre [name] on 02/25/19 at  3:40 PM EDT by a video enabled telemedicine application and verified that I am speaking with the correct person using two identifiers.   I discussed the limitations of evaluation and management by telemedicine and the availability of in person appointments. The patient expressed understanding and agreed to proceed.  History of Present Illness: We need to follow-up on MM, hypothyroidism, GERD. GERD is worse  There has been no runny nose, cough, chest pain, abdominal pain, diarrhea, constipation, skin rashes.   Observations/Objective: The patient appears to be in no acute distress, looks well.  Assessment and Plan:  See my Assessment and Plan. Follow Up Instructions:    I discussed the assessment and treatment plan with the patient. The patient was provided an opportunity to ask questions and all were answered. The patient agreed with the plan and demonstrated an understanding of the instructions.   The patient was advised to call back or seek an in-person evaluation if the symptoms worsen or if the condition fails to improve as anticipated.  I provided face-to-face time during this encounter. We were at different locations.   [name], MD

## 2019-02-25 NOTE — Assessment & Plan Note (Signed)
On chemo q 2 wks

## 2019-02-25 NOTE — Assessment & Plan Note (Signed)
On Levothroid Labs 

## 2019-03-03 ENCOUNTER — Other Ambulatory Visit: Payer: Self-pay

## 2019-03-03 ENCOUNTER — Inpatient Hospital Stay: Payer: 59

## 2019-03-03 VITALS — BP 126/75 | HR 68 | Temp 97.8°F | Resp 18 | Wt 186.0 lb

## 2019-03-03 DIAGNOSIS — C9000 Multiple myeloma not having achieved remission: Secondary | ICD-10-CM

## 2019-03-03 DIAGNOSIS — Z9481 Bone marrow transplant status: Secondary | ICD-10-CM

## 2019-03-03 DIAGNOSIS — Z5112 Encounter for antineoplastic immunotherapy: Secondary | ICD-10-CM | POA: Diagnosis not present

## 2019-03-03 LAB — CBC WITH DIFFERENTIAL (CANCER CENTER ONLY)
Abs Immature Granulocytes: 0.01 10*3/uL (ref 0.00–0.07)
Basophils Absolute: 0 10*3/uL (ref 0.0–0.1)
Basophils Relative: 0 %
Eosinophils Absolute: 0.1 10*3/uL (ref 0.0–0.5)
Eosinophils Relative: 2 %
HCT: 36.8 % — ABNORMAL LOW (ref 39.0–52.0)
Hemoglobin: 12.9 g/dL — ABNORMAL LOW (ref 13.0–17.0)
Immature Granulocytes: 0 %
Lymphocytes Relative: 25 %
Lymphs Abs: 1.5 10*3/uL (ref 0.7–4.0)
MCH: 36.6 pg — ABNORMAL HIGH (ref 26.0–34.0)
MCHC: 35.1 g/dL (ref 30.0–36.0)
MCV: 104.5 fL — ABNORMAL HIGH (ref 80.0–100.0)
Monocytes Absolute: 0.9 10*3/uL (ref 0.1–1.0)
Monocytes Relative: 16 %
Neutro Abs: 3.2 10*3/uL (ref 1.7–7.7)
Neutrophils Relative %: 57 %
Platelet Count: 111 10*3/uL — ABNORMAL LOW (ref 150–400)
RBC: 3.52 MIL/uL — ABNORMAL LOW (ref 4.22–5.81)
RDW: 13.4 % (ref 11.5–15.5)
WBC Count: 5.8 10*3/uL (ref 4.0–10.5)
nRBC: 0 % (ref 0.0–0.2)

## 2019-03-03 LAB — CMP (CANCER CENTER ONLY)
ALT: 23 U/L (ref 0–44)
AST: 27 U/L (ref 15–41)
Albumin: 3.9 g/dL (ref 3.5–5.0)
Alkaline Phosphatase: 72 U/L (ref 38–126)
Anion gap: 10 (ref 5–15)
BUN: 34 mg/dL — ABNORMAL HIGH (ref 8–23)
CO2: 20 mmol/L — ABNORMAL LOW (ref 22–32)
Calcium: 9 mg/dL (ref 8.9–10.3)
Chloride: 106 mmol/L (ref 98–111)
Creatinine: 1.03 mg/dL (ref 0.61–1.24)
GFR, Est AFR Am: >60 mL/min (ref >60)
GFR, Estimated: >60 mL/min (ref >60)
Glucose, Bld: 84 mg/dL (ref 70–99)
Potassium: 4.3 mmol/L (ref 3.5–5.1)
Sodium: 136 mmol/L (ref 135–145)
Total Bilirubin: 0.3 mg/dL (ref 0.3–1.2)
Total Protein: 7.5 g/dL (ref 6.5–8.1)

## 2019-03-03 MED ORDER — BORTEZOMIB CHEMO SQ INJECTION 3.5 MG (2.5MG/ML)
1.3000 mg/m2 | Freq: Once | INTRAMUSCULAR | Status: AC
  Administered 2019-03-03: 2.5 mg via SUBCUTANEOUS
  Filled 2019-03-03: qty 1

## 2019-03-03 MED ORDER — PROCHLORPERAZINE MALEATE 10 MG PO TABS: 10.0000 mg | ORAL_TABLET | Freq: Once | ORAL | Status: DC

## 2019-03-03 NOTE — Patient Instructions (Addendum)
**  Please take at least 500mg  of oral Calcium a day-->Can find Calcium/Vitamin D combinations at most pharmacies**  [facility] Discharge Instructions for Patients Receiving Chemotherapy  Today you received the following chemotherapy agents: Bortezomib (Velcade)  To help prevent nausea and vomiting after your treatment, we encourage you to take your nausea medication as directed.   If you develop nausea and vomiting that is not controlled by your nausea medication, call the clinic.   BELOW ARE SYMPTOMS THAT SHOULD BE REPORTED IMMEDIATELY:  *FEVER GREATER THAN 100.5 F  *CHILLS WITH OR WITHOUT FEVER  NAUSEA AND VOMITING THAT IS NOT CONTROLLED WITH YOUR NAUSEA MEDICATION  *UNUSUAL SHORTNESS OF BREATH  *UNUSUAL BRUISING OR BLEEDING  TENDERNESS IN MOUTH AND THROAT WITH OR WITHOUT PRESENCE OF ULCERS  *URINARY PROBLEMS  *BOWEL PROBLEMS  UNUSUAL RASH Items with * indicate a potential emergency and should be followed up as soon as possible.  Feel free to call the clinic should you have any questions or concerns. The clinic phone number is [phone].  Please show the [place] at check-in to the Emergency Department and triage nurse.

## 2019-03-04 LAB — PROTEIN ELECTROPHORESIS, SERUM
A/G Ratio: 1.1 (ref 0.7–1.7)
Albumin ELP: 3.5 g/dL (ref 2.9–4.4)
Alpha-1-Globulin: 0.2 g/dL (ref 0.0–0.4)
Alpha-2-Globulin: 0.7 g/dL (ref 0.4–1.0)
Beta Globulin: 1 g/dL (ref 0.7–1.3)
Gamma Globulin: 1.2 g/dL (ref 0.4–1.8)
Globulin, Total: 3.2 g/dL (ref 2.2–3.9)
Total Protein ELP: 6.7 g/dL (ref 6.0–8.5)

## 2019-03-04 LAB — KAPPA/LAMBDA LIGHT CHAINS
Kappa free light chain: 31.8 mg/L — ABNORMAL HIGH (ref 3.3–19.4)
Kappa, lambda light chain ratio: 1.65 (ref 0.26–1.65)
Lambda free light chains: 19.3 mg/L (ref 5.7–26.3)

## 2019-03-16 ENCOUNTER — Encounter: Payer: Self-pay | Admitting: Oncology

## 2019-03-17 ENCOUNTER — Inpatient Hospital Stay: Payer: 59

## 2019-03-17 ENCOUNTER — Other Ambulatory Visit: Payer: Self-pay

## 2019-03-17 VITALS — BP 110/69 | HR 73 | Temp 98.7°F | Resp 17

## 2019-03-17 DIAGNOSIS — C9002 Multiple myeloma in relapse: Secondary | ICD-10-CM

## 2019-03-17 DIAGNOSIS — C9000 Multiple myeloma not having achieved remission: Secondary | ICD-10-CM

## 2019-03-17 DIAGNOSIS — Z9481 Bone marrow transplant status: Secondary | ICD-10-CM

## 2019-03-17 DIAGNOSIS — Z5112 Encounter for antineoplastic immunotherapy: Secondary | ICD-10-CM | POA: Diagnosis not present

## 2019-03-17 LAB — CBC WITH DIFFERENTIAL (CANCER CENTER ONLY)
Abs Immature Granulocytes: 0.01 10*3/uL (ref 0.00–0.07)
Basophils Absolute: 0 10*3/uL (ref 0.0–0.1)
Basophils Relative: 1 %
Eosinophils Absolute: 0.1 10*3/uL (ref 0.0–0.5)
Eosinophils Relative: 3 %
HCT: 36.1 % — ABNORMAL LOW (ref 39.0–52.0)
Hemoglobin: 12.4 g/dL — ABNORMAL LOW (ref 13.0–17.0)
Immature Granulocytes: 0 %
Lymphocytes Relative: 24 %
Lymphs Abs: 1.3 10*3/uL (ref 0.7–4.0)
MCH: 36.2 pg — ABNORMAL HIGH (ref 26.0–34.0)
MCHC: 34.3 g/dL (ref 30.0–36.0)
MCV: 105.2 fL — ABNORMAL HIGH (ref 80.0–100.0)
Monocytes Absolute: 0.8 10*3/uL (ref 0.1–1.0)
Monocytes Relative: 15 %
Neutro Abs: 3.2 10*3/uL (ref 1.7–7.7)
Neutrophils Relative %: 57 %
Platelet Count: 95 10*3/uL — ABNORMAL LOW (ref 150–400)
RBC: 3.43 MIL/uL — ABNORMAL LOW (ref 4.22–5.81)
RDW: 13.2 % (ref 11.5–15.5)
WBC Count: 5.5 10*3/uL (ref 4.0–10.5)
nRBC: 0 % (ref 0.0–0.2)

## 2019-03-17 MED ORDER — SODIUM CHLORIDE 0.9 % IV SOLN
INTRAVENOUS | Status: DC
  Administered 2019-03-17: 16:00:00 via INTRAVENOUS
  Filled 2019-03-17: qty 250

## 2019-03-17 MED ORDER — PROCHLORPERAZINE MALEATE 10 MG PO TABS: 10.0000 mg | ORAL_TABLET | Freq: Once | ORAL | Status: DC

## 2019-03-17 MED ORDER — BORTEZOMIB CHEMO SQ INJECTION 3.5 MG (2.5MG/ML)
1.3000 mg/m2 | Freq: Once | INTRAMUSCULAR | Status: AC
  Administered 2019-03-17: 2.5 mg via SUBCUTANEOUS
  Filled 2019-03-17: qty 1

## 2019-03-17 MED ORDER — ZOLEDRONIC ACID 4 MG/5ML IV CONC
4.0000 mg | Freq: Once | INTRAVENOUS | Status: AC
  Administered 2019-03-17: 4 mg via INTRAVENOUS
  Filled 2019-03-17: qty 5

## 2019-03-17 NOTE — Patient Instructions (Signed)
[facility] Discharge Instructions for Patients Receiving Chemotherapy  Today you received the following chemotherapy agents Bortezomib (VELCADE).  To help prevent nausea and vomiting after your treatment, we encourage you to take your nausea medication as prescribed.   If you develop nausea and vomiting that is not controlled by your nausea medication, call the clinic.   BELOW ARE SYMPTOMS THAT SHOULD BE REPORTED IMMEDIATELY:  *FEVER GREATER THAN 100.5 F  *CHILLS WITH OR WITHOUT FEVER  NAUSEA AND VOMITING THAT IS NOT CONTROLLED WITH YOUR NAUSEA MEDICATION  *UNUSUAL SHORTNESS OF BREATH  *UNUSUAL BRUISING OR BLEEDING  TENDERNESS IN MOUTH AND THROAT WITH OR WITHOUT PRESENCE OF ULCERS  *URINARY PROBLEMS  *BOWEL PROBLEMS  UNUSUAL RASH Items with * indicate a potential emergency and should be followed up as soon as possible.  Feel free to call the clinic should you have any questions or concerns. The clinic phone number is [phone].  Please show the [place] [facility] at check-in to the Emergency Department and triage nurse.  Zoledronic Acid injection (Hypercalcemia, Oncology) What is this medicine? ZOLEDRONIC ACID (ZOE le dron ik AS id) lowers the amount of calcium loss from bone. It is used to treat too much calcium in your blood from cancer. It is also used to prevent complications of cancer that has spread to the bone. This medicine may be used for other purposes; ask your health care provider or pharmacist if you have questions. COMMON BRAND NAME(S): Zometa What should I tell my health care provider before I take this medicine? They need to know if you have any of these conditions:  aspirin-sensitive asthma  cancer, especially if you are receiving medicines used to treat cancer  dental disease or wear dentures  infection  kidney disease  receiving corticosteroids like dexamethasone or prednisone  an unusual or allergic reaction to zoledronic  acid, other medicines, foods, dyes, or preservatives  pregnant or trying to get pregnant  breast-feeding How should I use this medicine? This medicine is for infusion into a vein. It is given by a health care professional in a hospital or clinic setting. Talk to your pediatrician regarding the use of this medicine in children. Special care may be needed. Overdosage: If you think you have taken too much of this medicine contact a poison control center or emergency room at once. NOTE: This medicine is only for you. Do not share this medicine with others. What if I miss a dose? It is important not to miss your dose. Call your doctor or health care professional if you are unable to keep an appointment. What may interact with this medicine?  certain antibiotics given by injection  NSAIDs, medicines for pain and inflammation, like ibuprofen or naproxen  some diuretics like bumetanide, furosemide  teriparatide  thalidomide This list may not describe all possible interactions. Give your health care provider a list of all the medicines, herbs, non-prescription drugs, or dietary supplements you use. Also tell them if you smoke, drink alcohol, or use illegal drugs. Some items may interact with your medicine. What should I watch for while using this medicine? Visit your doctor or health care professional for regular checkups. It may be some time before you see the benefit from this medicine. Do not stop taking your medicine unless your doctor tells you to. Your doctor may order blood tests or other tests to see how you are doing. Women should inform their doctor if they wish to become pregnant or think they might be  pregnant. There is a potential for serious side effects to an unborn child. Talk to your health care professional or pharmacist for more information. You should make sure that you get enough calcium and vitamin D while you are taking this medicine. Discuss the foods you eat and the  vitamins you take with your health care professional. Some people who take this medicine have severe bone, joint, and/or muscle pain. This medicine may also increase your risk for jaw problems or a broken thigh bone. Tell your doctor right away if you have severe pain in your jaw, bones, joints, or muscles. Tell your doctor if you have any pain that does not go away or that gets worse. Tell your dentist and dental surgeon that you are taking this medicine. You should not have major dental surgery while on this medicine. See your dentist to have a dental exam and fix any dental problems before starting this medicine. Take good care of your teeth while on this medicine. Make sure you see your dentist for regular follow-up appointments. What side effects may I notice from receiving this medicine? Side effects that you should report to your doctor or health care professional as soon as possible:  allergic reactions like skin rash, itching or hives, swelling of the face, lips, or tongue  anxiety, confusion, or depression  breathing problems  changes in vision  eye pain  feeling faint or lightheaded, falls  jaw pain, especially after dental work  mouth sores  muscle cramps, stiffness, or weakness  redness, blistering, peeling or loosening of the skin, including inside the mouth  trouble passing urine or change in the amount of urine Side effects that usually do not require medical attention (report to your doctor or health care professional if they continue or are bothersome):  bone, joint, or muscle pain  constipation  diarrhea  fever  hair loss  irritation at site where injected  loss of appetite  nausea, vomiting  stomach upset  trouble sleeping  trouble swallowing  weak or tired This list may not describe all possible side effects. Call your doctor for medical advice about side effects. You may report side effects to FDA at 1-800-FDA-1088. Where should I keep my  medicine? This drug is given in a hospital or clinic and will not be stored at home. NOTE: This sheet is a summary. It may not cover all possible information. If you have questions about this medicine, talk to your doctor, pharmacist, or health care provider.  2020 Elsevier/Gold Standard (2014-01-31 14:19:39)  Coronavirus (COVID-19) Are you at risk?  Are you at risk for the Coronavirus (COVID-19)?  To be considered HIGH RISK for Coronavirus (COVID-19), you have to meet the following criteria:  . Traveled to [place], [place], [place], [place] or [place]; or in the [place] to [place], [place], [place], or [place]; and have fever, cough, and shortness of breath within the last 2 weeks of travel OR . Been in close contact with a person diagnosed with COVID-19 within the last 2 weeks and have fever, cough, and shortness of breath . IF YOU DO NOT MEET THESE CRITERIA, YOU ARE CONSIDERED LOW RISK FOR COVID-19.  What to do if you are HIGH RISK for COVID-19?  [name] If you are having a medical emergency, call 911. . Seek medical care right away. Before you go to a doctor's office, urgent care or emergency department, call ahead and tell them about your recent travel, contact with someone diagnosed with COVID-19,  and your symptoms. You should receive instructions from your physician's office regarding next steps of care.  . When you arrive at healthcare provider, tell the healthcare staff immediately you have returned from visiting [place], [place], [place], [place] or [place]; or traveled in the [place] to [place], [place], [place], or [place]; in the last two weeks or you have been in close contact with a person diagnosed with COVID-19 in the last 2 weeks.   . Tell the health care staff about your symptoms: fever, cough and shortness of breath. . After you have been seen by a medical provider, you will be either: o Tested for (COVID-19) and discharged home on quarantine except to  seek medical care if symptoms worsen, and asked to  - Stay home and avoid contact with others until you get your results (4-5 days)  - Avoid travel on public transportation if possible (such as bus, train, or airplane) or o Sent to the Emergency Department by EMS for evaluation, COVID-19 testing, and possible admission depending on your condition and test results.  What to do if you are LOW RISK for COVID-19?  Reduce your risk of any infection by using the same precautions used for avoiding the common cold or flu:  [name] Wash your hands often with soap and warm water for at least 20 seconds.  If soap and water are not readily available, use an alcohol-based hand sanitizer with at least 60% alcohol.  . If coughing or sneezing, cover your mouth and nose by coughing or sneezing into the elbow areas of your shirt or coat, into a tissue or into your sleeve (not your hands). . Avoid shaking hands with others and consider head nods or verbal greetings only. . Avoid touching your eyes, nose, or mouth with unwashed hands.  . Avoid close contact with people who are sick. . Avoid places or events with large numbers of people in one location, like concerts or sporting events. . Carefully consider travel plans you have or are making. . If you are planning any travel outside or inside the [place], visit the CDC's Travelers' Health webpage for the latest health notices. . If you have some symptoms but not all symptoms, continue to monitor at home and seek medical attention if your symptoms worsen. . If you are having a medical emergency, call 911.   [facility] / e-Visit: [email or web address]         [facility]: [facility]: [phone]                   [facility]: [phone]

## 2019-03-17 NOTE — Progress Notes (Signed)
Per Dr. [name]: okay to treat patient with Plt. count of 95

## 2019-03-18 ENCOUNTER — Telehealth: Payer: Self-pay | Admitting: Oncology

## 2019-03-18 ENCOUNTER — Other Ambulatory Visit: Payer: Self-pay | Admitting: Oncology

## 2019-03-18 NOTE — Progress Notes (Signed)
Andre [name] met with Dr. Alvie Heidelberg.  Please see her note.  She is suggesting per his request that it is okay to go to every 3 weeks on his Velcade treatment and we are operational lysing that

## 2019-03-18 NOTE — Telephone Encounter (Signed)
Scheduled appt per 6/30 sch message- unable to reach pt . Left message with appt date and time    

## 2019-03-31 ENCOUNTER — Other Ambulatory Visit: Payer: Self-pay

## 2019-03-31 ENCOUNTER — Other Ambulatory Visit: Payer: 59

## 2019-03-31 ENCOUNTER — Ambulatory Visit (INDEPENDENT_AMBULATORY_CARE_PROVIDER_SITE_OTHER): Payer: 59 | Admitting: Podiatry

## 2019-03-31 ENCOUNTER — Ambulatory Visit (INDEPENDENT_AMBULATORY_CARE_PROVIDER_SITE_OTHER): Payer: 59

## 2019-03-31 ENCOUNTER — Encounter: Payer: Self-pay | Admitting: Podiatry

## 2019-03-31 ENCOUNTER — Ambulatory Visit: Payer: 59

## 2019-03-31 VITALS — BP 118/75 | HR 68 | Temp 97.7°F | Resp 16

## 2019-03-31 DIAGNOSIS — M79675 Pain in left toe(s): Secondary | ICD-10-CM | POA: Diagnosis not present

## 2019-03-31 DIAGNOSIS — M2141 Flat foot [pes planus] (acquired), right foot: Secondary | ICD-10-CM

## 2019-03-31 DIAGNOSIS — M2142 Flat foot [pes planus] (acquired), left foot: Secondary | ICD-10-CM | POA: Diagnosis not present

## 2019-03-31 DIAGNOSIS — B351 Tinea unguium: Secondary | ICD-10-CM

## 2019-03-31 DIAGNOSIS — M79674 Pain in right toe(s): Secondary | ICD-10-CM | POA: Diagnosis not present

## 2019-03-31 DIAGNOSIS — R2681 Unsteadiness on feet: Secondary | ICD-10-CM

## 2019-03-31 NOTE — Progress Notes (Signed)
   Subjective:    Patient ID: Andre [name], male    DOB: 1950/09/22, 68 y.o.   MRN: [id number]  HPI    Review of Systems  All other systems reviewed and are negative.      Objective:   Physical Exam        Assessment & Plan:

## 2019-04-03 NOTE — Progress Notes (Signed)
Subjective:   Patient ID: Andre [name], male   DOB: 68 y.o.   MRN: [id number]   HPI Patient presents stating that the nails have been difficult for him and that he does have sickness multiple myeloma and is under active treatment.  Patient does like to run walk and feels like his foot structure is off even though is not having pain.  Patient does not smoke likes to be active   Review of Systems  All other systems reviewed and are negative.       Objective:  Physical Exam Vitals signs and nursing note reviewed.  Constitutional:      Appearance: He is well-developed.  Pulmonary:     Effort: Pulmonary effort is normal.  Musculoskeletal: Normal range of motion.  Skin:    General: Skin is warm.  Neurological:     Mental Status: He is alert.     Neurovascular status was found to be intact muscle strength was found to be adequate with range of motion within normal limits.  Patient is found to have a slightly inward walking gait but is not developing balance issues or tripping.  Patient has elongated nailbeds 1-5 both feet that are thick and moderately brittle with no redness or drainage noted but they are moderately painful and difficult for him to cut.  Patient was found to have good digital perfusion well oriented x3     Assessment:  Mycotic nail infection with pain 1-5 both feet with moderate balance issues which may be due to the chemotherapy and other treatments he has had     Plan:  H&P conditions reviewed and at this time debridement of nailbeds accomplished with no iatrogenic bleeding and advised him on healthy gait processes.  Patient will be seen back if symptoms persist and will have regular routine nail cuts provided  X-rays were negative for signs of adductus deformity or any indications that there is other type of pathology noted

## 2019-04-07 ENCOUNTER — Inpatient Hospital Stay: Payer: 59 | Attending: Oncology

## 2019-04-07 ENCOUNTER — Other Ambulatory Visit: Payer: Self-pay

## 2019-04-07 ENCOUNTER — Inpatient Hospital Stay: Payer: 59

## 2019-04-07 VITALS — BP 120/78 | HR 68 | Temp 99.1°F | Resp 18

## 2019-04-07 DIAGNOSIS — C9000 Multiple myeloma not having achieved remission: Secondary | ICD-10-CM | POA: Diagnosis present

## 2019-04-07 DIAGNOSIS — Z9481 Bone marrow transplant status: Secondary | ICD-10-CM

## 2019-04-07 DIAGNOSIS — Z5112 Encounter for antineoplastic immunotherapy: Secondary | ICD-10-CM | POA: Diagnosis not present

## 2019-04-07 LAB — CMP (CANCER CENTER ONLY)
ALT: 23 U/L (ref 0–44)
AST: 29 U/L (ref 15–41)
Albumin: 3.9 g/dL (ref 3.5–5.0)
Alkaline Phosphatase: 71 U/L (ref 38–126)
Anion gap: 10 (ref 5–15)
BUN: 36 mg/dL — ABNORMAL HIGH (ref 8–23)
CO2: 22 mmol/L (ref 22–32)
Calcium: 9.8 mg/dL (ref 8.9–10.3)
Chloride: 106 mmol/L (ref 98–111)
Creatinine: 1.07 mg/dL (ref 0.61–1.24)
GFR, Est AFR Am: >60 mL/min (ref >60)
GFR, Estimated: >60 mL/min (ref >60)
Glucose, Bld: 90 mg/dL (ref 70–99)
Potassium: 4.5 mmol/L (ref 3.5–5.1)
Sodium: 138 mmol/L (ref 135–145)
Total Bilirubin: 0.3 mg/dL (ref 0.3–1.2)
Total Protein: 7.5 g/dL (ref 6.5–8.1)

## 2019-04-07 LAB — CBC WITH DIFFERENTIAL (CANCER CENTER ONLY)
Abs Immature Granulocytes: 0.01 10*3/uL (ref 0.00–0.07)
Basophils Absolute: 0 10*3/uL (ref 0.0–0.1)
Basophils Relative: 1 %
Eosinophils Absolute: 0.1 10*3/uL (ref 0.0–0.5)
Eosinophils Relative: 2 %
HCT: 36.8 % — ABNORMAL LOW (ref 39.0–52.0)
Hemoglobin: 12.9 g/dL — ABNORMAL LOW (ref 13.0–17.0)
Immature Granulocytes: 0 %
Lymphocytes Relative: 25 %
Lymphs Abs: 1.5 10*3/uL (ref 0.7–4.0)
MCH: 37 pg — ABNORMAL HIGH (ref 26.0–34.0)
MCHC: 35.1 g/dL (ref 30.0–36.0)
MCV: 105.4 fL — ABNORMAL HIGH (ref 80.0–100.0)
Monocytes Absolute: 0.9 10*3/uL (ref 0.1–1.0)
Monocytes Relative: 15 %
Neutro Abs: 3.4 10*3/uL (ref 1.7–7.7)
Neutrophils Relative %: 57 %
Platelet Count: 110 10*3/uL — ABNORMAL LOW (ref 150–400)
RBC: 3.49 MIL/uL — ABNORMAL LOW (ref 4.22–5.81)
RDW: 13.6 % (ref 11.5–15.5)
WBC Count: 5.9 10*3/uL (ref 4.0–10.5)
nRBC: 0 % (ref 0.0–0.2)

## 2019-04-07 MED ORDER — PROCHLORPERAZINE MALEATE 10 MG PO TABS: 10.0000 mg | ORAL_TABLET | Freq: Once | ORAL | Status: DC

## 2019-04-07 MED ORDER — BORTEZOMIB CHEMO SQ INJECTION 3.5 MG (2.5MG/ML)
1.3000 mg/m2 | Freq: Once | INTRAMUSCULAR | Status: AC
  Administered 2019-04-07: 2.5 mg via SUBCUTANEOUS
  Filled 2019-04-07: qty 1

## 2019-04-07 NOTE — Patient Instructions (Signed)
[facility] Discharge Instructions for Patients Receiving Chemotherapy  Today you received the following chemotherapy agents Bortezomib (VELCADE).  To help prevent nausea and vomiting after your treatment, we encourage you to take your nausea medication as prescribed.   If you develop nausea and vomiting that is not controlled by your nausea medication, call the clinic.   BELOW ARE SYMPTOMS THAT SHOULD BE REPORTED IMMEDIATELY:  *FEVER GREATER THAN 100.5 F  *CHILLS WITH OR WITHOUT FEVER  NAUSEA AND VOMITING THAT IS NOT CONTROLLED WITH YOUR NAUSEA MEDICATION  *UNUSUAL SHORTNESS OF BREATH  *UNUSUAL BRUISING OR BLEEDING  TENDERNESS IN MOUTH AND THROAT WITH OR WITHOUT PRESENCE OF ULCERS  *URINARY PROBLEMS  *BOWEL PROBLEMS  UNUSUAL RASH Items with * indicate a potential emergency and should be followed up as soon as possible.  Feel free to call the clinic should you have any questions or concerns. The clinic phone number is [phone].  Please show the [place] at check-in to the Emergency Department and triage nurse.  Zoledronic Acid injection (Hypercalcemia, Oncology) What is this medicine? ZOLEDRONIC ACID (ZOE le dron ik AS id) lowers the amount of calcium loss from bone. It is used to treat too much calcium in your blood from cancer. It is also used to prevent complications of cancer that has spread to the bone. This medicine may be used for other purposes; ask your health care provider or pharmacist if you have questions. COMMON BRAND NAME(S): Zometa What should I tell my health care provider before I take this medicine? They need to know if you have any of these conditions:  aspirin-sensitive asthma  cancer, especially if you are receiving medicines used to treat cancer  dental disease or wear dentures  infection  kidney disease  receiving corticosteroids like dexamethasone or prednisone  an unusual or allergic reaction to zoledronic  acid, other medicines, foods, dyes, or preservatives  pregnant or trying to get pregnant  breast-feeding How should I use this medicine? This medicine is for infusion into a vein. It is given by a health care professional in a hospital or clinic setting. Talk to your pediatrician regarding the use of this medicine in children. Special care may be needed. Overdosage: If you think you have taken too much of this medicine contact a poison control center or emergency room at once. NOTE: This medicine is only for you. Do not share this medicine with others. What if I miss a dose? It is important not to miss your dose. Call your doctor or health care professional if you are unable to keep an appointment. What may interact with this medicine?  certain antibiotics given by injection  NSAIDs, medicines for pain and inflammation, like ibuprofen or naproxen  some diuretics like bumetanide, furosemide  teriparatide  thalidomide This list may not describe all possible interactions. Give your health care provider a list of all the medicines, herbs, non-prescription drugs, or dietary supplements you use. Also tell them if you smoke, drink alcohol, or use illegal drugs. Some items may interact with your medicine. What should I watch for while using this medicine? Visit your doctor or health care professional for regular checkups. It may be some time before you see the benefit from this medicine. Do not stop taking your medicine unless your doctor tells you to. Your doctor may order blood tests or other tests to see how you are doing. Women should inform their doctor if they wish to become pregnant or think they might be  pregnant. There is a potential for serious side effects to an unborn child. Talk to your health care professional or pharmacist for more information. You should make sure that you get enough calcium and vitamin D while you are taking this medicine. Discuss the foods you eat and the  vitamins you take with your health care professional. Some people who take this medicine have severe bone, joint, and/or muscle pain. This medicine may also increase your risk for jaw problems or a broken thigh bone. Tell your doctor right away if you have severe pain in your jaw, bones, joints, or muscles. Tell your doctor if you have any pain that does not go away or that gets worse. Tell your dentist and dental surgeon that you are taking this medicine. You should not have major dental surgery while on this medicine. See your dentist to have a dental exam and fix any dental problems before starting this medicine. Take good care of your teeth while on this medicine. Make sure you see your dentist for regular follow-up appointments. What side effects may I notice from receiving this medicine? Side effects that you should report to your doctor or health care professional as soon as possible:  allergic reactions like skin rash, itching or hives, swelling of the face, lips, or tongue  anxiety, confusion, or depression  breathing problems  changes in vision  eye pain  feeling faint or lightheaded, falls  jaw pain, especially after dental work  mouth sores  muscle cramps, stiffness, or weakness  redness, blistering, peeling or loosening of the skin, including inside the mouth  trouble passing urine or change in the amount of urine Side effects that usually do not require medical attention (report to your doctor or health care professional if they continue or are bothersome):  bone, joint, or muscle pain  constipation  diarrhea  fever  hair loss  irritation at site where injected  loss of appetite  nausea, vomiting  stomach upset  trouble sleeping  trouble swallowing  weak or tired This list may not describe all possible side effects. Call your doctor for medical advice about side effects. You may report side effects to FDA at 1-800-FDA-1088. Where should I keep my  medicine? This drug is given in a hospital or clinic and will not be stored at home. NOTE: This sheet is a summary. It may not cover all possible information. If you have questions about this medicine, talk to your doctor, pharmacist, or health care provider.  2020 Elsevier/Gold Standard (2014-01-31 14:19:39)  Coronavirus (COVID-19) Are you at risk?  Are you at risk for the Coronavirus (COVID-19)?  To be considered HIGH RISK for Coronavirus (COVID-19), you have to meet the following criteria:  . Traveled to [place], [place], [place], [place] or [place]; or in the [place] to [place], [place], [place], or [place]; and have fever, cough, and shortness of breath within the last 2 weeks of travel OR . Been in close contact with a person diagnosed with COVID-19 within the last 2 weeks and have fever, cough, and shortness of breath . IF YOU DO NOT MEET THESE CRITERIA, YOU ARE CONSIDERED LOW RISK FOR COVID-19.  What to do if you are HIGH RISK for COVID-19?  [name] If you are having a medical emergency, call 911. . Seek medical care right away. Before you go to a doctor's office, urgent care or emergency department, call ahead and tell them about your recent travel, contact with someone diagnosed with COVID-19,  and your symptoms. You should receive instructions from your physician's office regarding next steps of care.  . When you arrive at healthcare provider, tell the healthcare staff immediately you have returned from visiting [place], [place], [place], [place] or [place]; or traveled in the [place] to [place], [place], [place], or [place]; in the last two weeks or you have been in close contact with a person diagnosed with COVID-19 in the last 2 weeks.   . Tell the health care staff about your symptoms: fever, cough and shortness of breath. . After you have been seen by a medical provider, you will be either: o Tested for (COVID-19) and discharged home on quarantine except to  seek medical care if symptoms worsen, and asked to  - Stay home and avoid contact with others until you get your results (4-5 days)  - Avoid travel on public transportation if possible (such as bus, train, or airplane) or o Sent to the Emergency Department by EMS for evaluation, COVID-19 testing, and possible admission depending on your condition and test results.  What to do if you are LOW RISK for COVID-19?  Reduce your risk of any infection by using the same precautions used for avoiding the common cold or flu:  [name] Wash your hands often with soap and warm water for at least 20 seconds.  If soap and water are not readily available, use an alcohol-based hand sanitizer with at least 60% alcohol.  . If coughing or sneezing, cover your mouth and nose by coughing or sneezing into the elbow areas of your shirt or coat, into a tissue or into your sleeve (not your hands). . Avoid shaking hands with others and consider head nods or verbal greetings only. . Avoid touching your eyes, nose, or mouth with unwashed hands.  . Avoid close contact with people who are sick. . Avoid places or events with large numbers of people in one location, like concerts or sporting events. . Carefully consider travel plans you have or are making. . If you are planning any travel outside or inside the [place], visit the CDC's Travelers' Health webpage for the latest health notices. . If you have some symptoms but not all symptoms, continue to monitor at home and seek medical attention if your symptoms worsen. . If you are having a medical emergency, call 911.   [place] / e-Visit: [email or web address]         [facility]: [facility]: [phone]                   [facility]: [phone]

## 2019-04-08 LAB — PROTEIN ELECTROPHORESIS, SERUM
A/G Ratio: 1.4 (ref 0.7–1.7)
Albumin ELP: 4 g/dL (ref 2.9–4.4)
Alpha-1-Globulin: 0.2 g/dL (ref 0.0–0.4)
Alpha-2-Globulin: 0.6 g/dL (ref 0.4–1.0)
Beta Globulin: 0.9 g/dL (ref 0.7–1.3)
Gamma Globulin: 1.1 g/dL (ref 0.4–1.8)
Globulin, Total: 2.8 g/dL (ref 2.2–3.9)
Total Protein ELP: 6.8 g/dL (ref 6.0–8.5)

## 2019-04-08 LAB — KAPPA/LAMBDA LIGHT CHAINS
Kappa free light chain: 35.7 mg/L — ABNORMAL HIGH (ref 3.3–19.4)
Kappa, lambda light chain ratio: 1.19 (ref 0.26–1.65)
Lambda free light chains: 30.1 mg/L — ABNORMAL HIGH (ref 5.7–26.3)

## 2019-04-10 ENCOUNTER — Other Ambulatory Visit: Payer: Self-pay

## 2019-04-10 ENCOUNTER — Ambulatory Visit: Payer: 59 | Admitting: Orthotics

## 2019-04-10 DIAGNOSIS — R609 Edema, unspecified: Secondary | ICD-10-CM

## 2019-04-10 DIAGNOSIS — R2681 Unsteadiness on feet: Secondary | ICD-10-CM

## 2019-04-10 DIAGNOSIS — M2142 Flat foot [pes planus] (acquired), left foot: Secondary | ICD-10-CM

## 2019-04-10 DIAGNOSIS — R600 Localized edema: Secondary | ICD-10-CM

## 2019-04-10 DIAGNOSIS — D701 Agranulocytosis secondary to cancer chemotherapy: Secondary | ICD-10-CM

## 2019-04-10 DIAGNOSIS — T451X5A Adverse effect of antineoplastic and immunosuppressive drugs, initial encounter: Secondary | ICD-10-CM

## 2019-04-10 DIAGNOSIS — M2141 Flat foot [pes planus] (acquired), right foot: Secondary | ICD-10-CM

## 2019-04-10 DIAGNOSIS — D709 Neutropenia, unspecified: Secondary | ICD-10-CM

## 2019-04-10 NOTE — Progress Notes (Signed)
Patient came in today for Eval/assessment for [facility].  Patient presents with a history of falling, and also fearful of falling.  Balance tests performed and patient does have issues keeping balance especially in foot to heel test.  Patient has week dorsiflexors and well as inverters/everters muscle group.  Demonstrants ankle instability.   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  He has fallen several times in the past few months.  He is treatment for cancer

## 2019-04-14 ENCOUNTER — Ambulatory Visit: Payer: 59

## 2019-04-14 ENCOUNTER — Other Ambulatory Visit: Payer: 59

## 2019-04-23 ENCOUNTER — Other Ambulatory Visit: Payer: Self-pay | Admitting: Oncology

## 2019-04-28 ENCOUNTER — Inpatient Hospital Stay (HOSPITAL_BASED_OUTPATIENT_CLINIC_OR_DEPARTMENT_OTHER): Payer: 59 | Admitting: Oncology

## 2019-04-28 ENCOUNTER — Inpatient Hospital Stay: Payer: 59 | Attending: Oncology

## 2019-04-28 ENCOUNTER — Other Ambulatory Visit: Payer: Self-pay

## 2019-04-28 ENCOUNTER — Inpatient Hospital Stay: Payer: 59

## 2019-04-28 VITALS — BP 123/73 | HR 99 | Temp 99.1°F | Resp 18 | Ht 69.0 in | Wt 186.5 lb

## 2019-04-28 DIAGNOSIS — C9000 Multiple myeloma not having achieved remission: Secondary | ICD-10-CM | POA: Diagnosis present

## 2019-04-28 DIAGNOSIS — Z9481 Bone marrow transplant status: Secondary | ICD-10-CM | POA: Diagnosis not present

## 2019-04-28 DIAGNOSIS — Z79899 Other long term (current) drug therapy: Secondary | ICD-10-CM | POA: Diagnosis not present

## 2019-04-28 DIAGNOSIS — Z87891 Personal history of nicotine dependence: Secondary | ICD-10-CM | POA: Diagnosis not present

## 2019-04-28 DIAGNOSIS — Z5112 Encounter for antineoplastic immunotherapy: Secondary | ICD-10-CM | POA: Diagnosis not present

## 2019-04-28 DIAGNOSIS — E039 Hypothyroidism, unspecified: Secondary | ICD-10-CM | POA: Diagnosis not present

## 2019-04-28 DIAGNOSIS — I10 Essential (primary) hypertension: Secondary | ICD-10-CM | POA: Diagnosis not present

## 2019-04-28 DIAGNOSIS — E669 Obesity, unspecified: Secondary | ICD-10-CM | POA: Insufficient documentation

## 2019-04-28 LAB — CBC WITH DIFFERENTIAL (CANCER CENTER ONLY)
Abs Immature Granulocytes: 0.02 10*3/uL (ref 0.00–0.07)
Basophils Absolute: 0 10*3/uL (ref 0.0–0.1)
Basophils Relative: 0 %
Eosinophils Absolute: 0.1 10*3/uL (ref 0.0–0.5)
Eosinophils Relative: 1 %
HCT: 36.1 % — ABNORMAL LOW (ref 39.0–52.0)
Hemoglobin: 12.4 g/dL — ABNORMAL LOW (ref 13.0–17.0)
Immature Granulocytes: 0 %
Lymphocytes Relative: 15 %
Lymphs Abs: 1 10*3/uL (ref 0.7–4.0)
MCH: 36.3 pg — ABNORMAL HIGH (ref 26.0–34.0)
MCHC: 34.3 g/dL (ref 30.0–36.0)
MCV: 105.6 fL — ABNORMAL HIGH (ref 80.0–100.0)
Monocytes Absolute: 0.9 10*3/uL (ref 0.1–1.0)
Monocytes Relative: 13 %
Neutro Abs: 4.9 10*3/uL (ref 1.7–7.7)
Neutrophils Relative %: 71 %
Platelet Count: 100 10*3/uL — ABNORMAL LOW (ref 150–400)
RBC: 3.42 MIL/uL — ABNORMAL LOW (ref 4.22–5.81)
RDW: 13.8 % (ref 11.5–15.5)
WBC Count: 6.9 10*3/uL (ref 4.0–10.5)
nRBC: 0 % (ref 0.0–0.2)

## 2019-04-28 LAB — CMP (CANCER CENTER ONLY)
ALT: 24 U/L (ref 0–44)
AST: 30 U/L (ref 15–41)
Albumin: 3.8 g/dL (ref 3.5–5.0)
Alkaline Phosphatase: 75 U/L (ref 38–126)
Anion gap: 11 (ref 5–15)
BUN: 38 mg/dL — ABNORMAL HIGH (ref 8–23)
CO2: 18 mmol/L — ABNORMAL LOW (ref 22–32)
Calcium: 9.6 mg/dL (ref 8.9–10.3)
Chloride: 105 mmol/L (ref 98–111)
Creatinine: 1.2 mg/dL (ref 0.61–1.24)
GFR, Est AFR Am: >60 mL/min (ref >60)
GFR, Estimated: >60 mL/min (ref >60)
Glucose, Bld: 76 mg/dL (ref 70–99)
Potassium: 4.9 mmol/L (ref 3.5–5.1)
Sodium: 134 mmol/L — ABNORMAL LOW (ref 135–145)
Total Bilirubin: 0.6 mg/dL (ref 0.3–1.2)
Total Protein: 7.3 g/dL (ref 6.5–8.1)

## 2019-04-28 MED ORDER — PROCHLORPERAZINE MALEATE 10 MG PO TABS: 10.0000 mg | ORAL_TABLET | Freq: Once | ORAL | Status: DC

## 2019-04-28 MED ORDER — BORTEZOMIB CHEMO SQ INJECTION 3.5 MG (2.5MG/ML)
1.3000 mg/m2 | Freq: Once | INTRAMUSCULAR | Status: AC
  Administered 2019-04-28: 2.5 mg via SUBCUTANEOUS
  Filled 2019-04-28: qty 1

## 2019-04-28 NOTE — Patient Instructions (Signed)
[facility] Discharge Instructions for Patients Receiving Chemotherapy  Today you received the following chemotherapy agents Velcade To help prevent nausea and vomiting after your treatment, we encourage you to take your nausea medication as prescribed.   If you develop nausea and vomiting that is not controlled by your nausea medication, call the clinic.   BELOW ARE SYMPTOMS THAT SHOULD BE REPORTED IMMEDIATELY:  *FEVER GREATER THAN 100.5 F  *CHILLS WITH OR WITHOUT FEVER  NAUSEA AND VOMITING THAT IS NOT CONTROLLED WITH YOUR NAUSEA MEDICATION  *UNUSUAL SHORTNESS OF BREATH  *UNUSUAL BRUISING OR BLEEDING  TENDERNESS IN MOUTH AND THROAT WITH OR WITHOUT PRESENCE OF ULCERS  *URINARY PROBLEMS  *BOWEL PROBLEMS  UNUSUAL RASH Items with * indicate a potential emergency and should be followed up as soon as possible.  Feel free to call the clinic should you have any questions or concerns. The clinic phone number is [phone].  Please show the CHEMO ALERT CARD at check-in to the Emergency Department and triage nurse.   

## 2019-04-28 NOTE — Progress Notes (Signed)
°[facility]  °[phone] [phone]  ° ° °ID: Andre [name]  DOB: 70/16/1952  MR#: [id number] CSN#: [id number] ° ° °Patient Care Team: °[name], Aleksei V, MD as PCP - General (Internal Medicine) °,  C, MD as Consulting Physician (Oncology) °Hartsell, Fletcher Lee III, MD as Consulting Physician (Neurology) °Gasparetto, Cristina, MD as Consulting Physician (Internal Medicine) °Willis, Charles K, MD as Consulting Physician (Neurology) °Gollehon, Steve, DDS as Referring Physician (Oral Surgery) °OTHERS: Sandra Fuller DDS ° ° °CHIEF COMPLAINT: Multiple myeloma ° °CURRENT TREATMENT: Maintenance therapy with bortezomib, zoledronate ° ° °INTERVAL HISTORY:  °[name] returns today for follow-up and treatment of his multiple myeloma.  On 03/12/2019 he met with Christina Gasparetto at [facility].  She felt he was doing well enough that his treatments could be stretched to every 3weeks chiefly for symptomatic reasons.  He is very pleased with this arrangement. ° °He continues on bortezomib, now given every 21 days. He tolerates this well and without any noticeable side effects.   ° °He also continues on zolendronate, which is given every 12 weeks.  His most recent dose was 03/17/2019. He tolerates this well and without any noticeable side effects.   ° °Of course we are following his M spike and his kappa lambda ratio.  Current results are very favorable. ° °Results for Phegley, Andre [name] "[name]" (MRN [id number]) as of 04/28/2019 09:46 ° Ref. Range 12/09/2018 14:18 01/06/2019 14:05 02/03/2019 13:56 03/03/2019 14:42 04/07/2019 14:45  °M-SPIKE, % Latest Ref Range: Not Observed g/dL 0.2 (H) Not Observed 0.3 (H) Not Observed Not Observed  °Results for Loch, Andre [name] "[name]" (MRN [id number]) as of 04/28/2019 09:46 ° Ref. Range 12/09/2018 14:17 01/06/2019 14:05 02/03/2019 13:56 03/03/2019 14:42 04/07/2019 14:44  °Kappa, lamda light chain ratio Latest Ref Range: 0.26 - 1.65  1.96 (H) 1.40 1.38 1.65 1.19   ° ° °Since his last visit here, he saw Dr. [name] Regal on 04/04/2019 for his bilateral pes planus. Bilateral foot Xrays performed on the same day were negative for signs of adductus deformity or any indications that there is other type of pathology noted.  ° ° °REVIEW OF SYSTEMS: °[name] has been exercising regularly; on Monday, Wednesday, and Fridays he utilizes his indoor bike and weight band, then on Tuesdays, Thursdays, and Saturdays he is walking 2-3 miles. He continues to work, but he is working from home and maintaining appropriate social distancing practices. He is retiring in December 68, and will be moving to Medicare for insurance. [name] does note some recent social stress from the loss of a family member 68. The patient denies unusual headaches, visual changes, nausea, vomiting, or dizziness. There has been no unusual cough, phlegm production, or pleurisy. This been no change in bowel or bladder habits. The patient denies unexplained fatigue or unexplained weight loss, bleeding, rash, or fever. A detailed review of systems was otherwise noncontributory.  ° ° °HISTORY OF PRESENT ILLNESS:  °From the original intake nodes: ° °Andre [name] was worked up for peptic ulcer disease in August of 2011, with significant bleeding and anemia.  The patient was Helicobacter pylori negative.  He received some epinephrine when he had his EGD and then started on Protonix.  [name] anemia slowly resolved so that by September, his hemoglobin was up to 10.9 and by earlier this month, his hemoglobin was up to 12.5.   ° °As part of his general workup, he was found to have a slightly elevated globulin fraction.  In September, his total protein   was 8.3 with an albumin of 3.8.  In January, the total protein was 8.4 and albumin 3.6.  With persistence of this slight abnormality, Dr. Brodie obtained serum immunofixation and SPEP.  The SPTP showed an M-spike of 2.67 grams.  A total IgG was 4,190.  Total IgA low at 28.  Total  IgM low at 28 also.  The immunofixation showed a monoclonal IgG lambda paraprotein.  There were also monoclonal free lambda light chains present. With this information, the patient was referred for further evaluation.  ° °A diagnosis of myeloma was confirmed by bone marrow biopsy and subsequebnt treatment is as detailed below ° ° °PAST MEDICAL HISTORY: °Past Medical History:  °Diagnosis Date  °• Allergy   °• Anemia   °• Arthritis   °• Asthma   ° no treatment x 20 years  °• Depression   °• Double vision   ° occurs at times   °• Duodenal ulcer   °• GERD (gastroesophageal reflux disease)   °• Hyperlipidemia   °• Hypertension   °• Hypothyroidism   °• Multiple myeloma 07/04/2011  °• Thyroid disease   ° °Significant for peptic ulcer disease as noted above.  History of hyperlipidemia.  History of anxiety and depression.  History of GERD which is significantly improved with weight loss and history of reactive airway disease, possibly secondary to the GERD which also improved with weight loss.  There was a history of obesity, now much improved secondary to exercise and diet.  ° ° °PAST SURGICAL HISTORY: °Past Surgical History:  °Procedure Laterality Date  °• BONE MARROW TRANSPLANT  2011  ° for MM  °• CARDIOLITE STUDY  11/25/2003  ° NORMAL  °• TONSILLECTOMY    ° ° ° °FAMILY HISTORY: °Family History  °Problem Relation Age of Onset  °• Throat cancer Mother   °• Hypertension Father   °• Stroke Father   °• Asthma Father   °• Diabetes Father   °• Pancreatic cancer Brother   ° °[name] father died at the age of 84 following a stroke.  [name] mother died with cancer of the throat at the age of 74.   She had a history of depression and was a smoker.  Not clear how much alcohol she drank, according to the patient.  The patient had two brothers.  One died from the age of 6 from a "kidney problem."  The second one died at the age of 56 from pancreatic cancer. (This may have been duodenal cancer. 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  His wife of 37+ years was Ellen, but they divorced. In 2016 he remarried, his current wife's name is [name].  The patient has two daughters:  Mary Ellen, who works in real estate, and Elizabeth, who had her second child April 2016. Both daughters live here in [place]. The patient is very close to his daughters.  Elizabeth's first daughter, Haley, born 03/06/2013, apparently has Weber/Osler/Rendu. The patient is not a church attender.  [name] also participates in [employer]-An[employer] even though actually there is no alcohol or drug problem in the family.  He participates in this group and he gets quite a bit of support from it.   ° ° ADVANCED DIRECTIVES: In place ° ° °HEALTH MAINTENANCE: (updated 07/16/2013) °Social History  ° °Tobacco Use  °• Smoking status:   Former Smoker  °  Years: 3.00  °  Quit date: 03/27/1969  °  Years since quitting: 50.1  °• Smokeless tobacco: Never Used  °Substance Use Topics  °• Alcohol use: No  °• Drug use: No  ° ° Colonoscopy: ° PSA:  Sept 2014, normal/Dr. [name] ° Lipid panel: Sept 2014/Dr. [name] ° °Allergies  °Allergen Reactions  °• Atorvastatin Other (See Comments)  °• Rosuvastatin   °  Other reaction(s): Liver Disorder  °• Crestor [Rosuvastatin Calcium]   °  ADVERSE EFFECTS ON LIVER  °• Lipitor [Atorvastatin Calcium]   °• Septra [Sulfamethoxazole-Trimethoprim] Rash  ° °Current Outpatient Medications  °Medication Sig Dispense Refill  °• Xion complex vitamins tablet Take 1 tablet by mouth daily. 100 tablet 3  °• calcium-vitamin D (OSCAL WITH D) 500-200 MG-UNIT tablet Take 1 tablet by mouth daily.    °• cetirizine (ZYRTEC) 10 MG tablet Take 10 mg by mouth daily.    °• Cholecalciferol (VITAMIN D3) 2000 units capsule Take 1 capsule (2,000 Units total) by mouth daily. 100 capsule 3  °• docusate sodium (COLACE) 100 MG  capsule Take 100 mg by mouth daily as needed for mild constipation or moderate constipation.    °• escitalopram (LEXAPRO) 10 MG tablet TAKE 1 TABLET BY MOUTH  DAILY 90 tablet 3  °• famotidine (PEPCID) 40 MG tablet Take 1 tablet (40 mg total) by mouth daily. 90 tablet 3  °• feeding supplement, ENSURE ENLIVE, (ENSURE ENLIVE) LIQD Take 237 mLs by mouth 2 (two) times daily between meals. 237 mL 12  °• levothyroxine (SYNTHROID, LEVOTHROID) 150 MCG tablet TAKE 1 TABLET BY MOUTH  EVERY MORNING ON AN EMPTY  STOMACH 90 tablet 3  °• ondansetron (ZOFRAN) 8 MG tablet TAKE 1 TABLET BY MOUTH EVERY 8 HOURS AS NEEDED FOR NAUSEA OR VOMITING 20 tablet 0  °• pantoprazole (PROTONIX) 40 MG tablet TAKE 1 TABLET BY MOUTH  TWICE A DAY BEFORE MEALS 180 tablet 3  °• sildenafil (VIAGRA) 25 MG tablet Take 1 tablet (25 mg total) by mouth daily as needed for erectile dysfunction. 10 tablet 0  °• triamcinolone cream (KENALOG) 0.5 % Apply 1 application topically 4 (four) times daily. 60 g 2  °• valACYclovir (VALTREX) 1000 MG tablet TAKE 1 TABLET BY MOUTH  DAILY 90 tablet 3  °• zolendronic acid (ZOMETA) 4 MG/5ML injection Inject 4 mg into the vein every 3 (three) months.    ° °No current facility-administered medications for this visit.   ° °Objective: Middle-aged white man in no acute distress ° °Vitals:  ° 04/28/19 0939  °BP: 123/73  °Pulse: 99  °Resp: 18  °Temp: 99.1 °F (37.3 °C)  °SpO2: 98%  °Body mass index is 27.54 kg/m². °Filed Weights  ° 04/28/19 0939  °Weight: 186 lb 8 oz (84.6 kg)  ° ° °Sclerae unicteric, EOMs intact °Wearing a mask °No cervical or supraclavicular adenopathy °Lungs no rales or rhonchi °Heart regular rate and rhythm °Abd soft, nontender, positive bowel sounds °MSK no focal spinal tenderness, no upper extremity lymphedema °Neuro: nonfocal, well oriented, appropriate affect ° ° ° °LAB RESULTS: ° °Appointment on 04/28/2019  °Component Date Value Ref Range Status  °• WBC Count 04/28/2019 6.9  4.0 - 10.5 K/uL Final  °• RBC  04/28/2019 3.42* 4.22 - 5.81 MIL/uL Final  °• Hemoglobin 04/28/2019 12.4* 13.0 - 17.0 g/dL Final  °• HCT 04/28/2019 36.1* 39.0 - 52.0 % Final  °• MCV 04/28/2019 105.6* 80.0 - 100.0 fL Final  °• MCH 04/28/2019 36.3* 26.0 -   34.0 pg Final  °• MCHC 04/28/2019 34.3  30.0 - 36.0 g/dL Final  °• RDW 04/28/2019 13.8  11.5 - 15.5 % Final  °• Platelet Count 04/28/2019 100* 150 - 400 K/uL Final  °• nRBC 04/28/2019 0.0  0.0 - 0.2 % Final  °• Neutrophils Relative % 04/28/2019 71  % Final  °• Neutro Abs 04/28/2019 4.9  1.7 - 7.7 K/uL Final  °• Lymphocytes Relative 04/28/2019 15  % Final  °• Lymphs Abs 04/28/2019 1.0  0.7 - 4.0 K/uL Final  °• Monocytes Relative 04/28/2019 13  % Final  °• Monocytes Absolute 04/28/2019 0.9  0.1 - 1.0 K/uL Final  °• Eosinophils Relative 04/28/2019 1  % Final  °• Eosinophils Absolute 04/28/2019 0.1  0.0 - 0.5 K/uL Final  °• Basophils Relative 04/28/2019 0  % Final  °• Basophils Absolute 04/28/2019 0.0  0.0 - 0.1 K/uL Final  °• Immature Granulocytes 04/28/2019 0  % Final  °• Abs Immature Granulocytes 04/28/2019 0.02  0.00 - 0.07 K/uL Final  ° Performed at [facility], [address] [place], [place] [address]  ° ° °STUDIES: °Dg Foot 2 Views Left ° °Result Date: 03/31/2019 °Please see detailed radiograph report in office note. ° °Dg Foot 2 Views Right ° °Result Date: 03/31/2019 °Please see detailed radiograph report in office note. ° ° °ASSESSMENT: 67 y.o. [place] man with a history of multiple myeloma diagnosed February of 2012, with an initial M-spike of 2.6 g/dL, IFE showing an IgG lambda paraprotein and free lambda light chains in the urine. Cytogenetics showed trisomy 11. Bone marrow biopsy showed a 22% plasmacytosis. Treated with  ° °(1) bortezomib (subcutaneously), lenalidomide, and dexamethasone, with repeat bone marrow biopsy May of 2012 showing 10% plasmacytosis  ° °(2) high-dose chemotherapy with BCNU and melphalan at [facility], followed by stem cell rescue July of 2012   ° °(3) on zoledronic acid started December of 2012, initially monthly, currently given every 3 months, most recent dose  12/07/2015 ° °(4) low-dose lenalidomide resumed April 2013, interrupted several times.  Resumed again on 02/19/2013, ata dose of 5 mg daily, 21 days on and 7 days off, later further reduced to 7 days on, 7 days off ° °(5) CNS symptoms and abnormal brain MRI extensively evaluated by neurology with no definitive diagnosis established ° °(6) rising M spike noted June 2014 but did not meet criteria for carfilzomib study ° °(7) on lenalidomide 25 mg daily, 14 days on, 7 days off, starting 04/18/2013, interrupted December 2014 because of rash;  ° (a) resumed January 2015 at 10 mg/ day at 21 days on/ 7 days off ° (Ludie) starting 08/18/2014 decreased to 10 mg/ day 14 days on, 7 days off because of cytopenias ° (c) as of February 2016 was on 5 mg daily 7 days on 7 days off ° (d) lenalidomide discontinued December 2016 with evidence of disease progression ° °(8) transient global amnesia 05/29/2015, resolved without intervention ° °(9) starting PVD 10/25/2015 w ASA 325 thromboprophylaxis, valacyclovir prophylaxis, last dose 12/17/2015 ° (a) pomalidomide 4 mg/d days 1-14 ° (Nekhi) bortezomib sQ days 2,5,9,12 of each 21 day cycle ° (c) dexamethasome 20 mg two days a week ° (d) dexamethasone bortezomib and pomalidomide discontinued late December 2018 with poor tolerance ° °(10) metapneumovirus pneumonia April 2017 ° (a) completing course of steroids and week of bactrim mid April 2017 ° °(11) status post second autologous transplant at [facility] 02/04/2016(preparatory regimen melphalan 200 mg/m²) ° (a) received twelve-month vaccinations 03/14/2017 (DPT,   Haemophilus, Pneumovax 13, polio) ° (Aldon) 14 months injections 05/04/2016 include DTaP, Hib conjugate, HepB energex Jawuan 20 mcg/ml, Prevnar 13 ° (c) 24 month vaccines due at [facility] June 2019 ° °(12) maintenance therapy started November 2017, consisting of ° (a) bortezomib 1.3 mg/M2  every 14 days, first dose 07/27/2016 ° (Treg) pomalidomide 1 mg days 1-21 Q28 days, started 07/19/2016 ° (c) zolendronate monthly started 07/27/2016 (previously Q 3 months) however patient unable to tolerate, and changed back to q3 month in April, 2018  ° (d) Bortezomib changed to monthly as of June 2018 because of tolerance issues, however discontinued after 09/14/2017 dose because of a rise in his M spike ° (e) pomalidomide held after 10/18/2017 in preparation for possible study at [facility] (venetoclax)--never resumed ° (f) with numbers actually improved off treatment, resumed every 2-week bortezomib 12/25/2017 ° (g) changed to every 3-week bortezomib as of July 2020 ° °PLAN:  °[name] is now 3 years out from his second autologous transplant for multiple myeloma.  His M spike is currently not detectable and his kappa lambda ratio is normal. ° °We are extending his treatments to every 3 weeks.  He would like to extend them further or even decrease them but I certainly do not recommend that. ° °I am delighted that his new exercise program.  He is looking healthier than he has recently and he is looking forward to retiring definitively this December. ° °We are continuing the treatments every 3 weeks.  He will have his zoledronate in September and December.  He will see me with the December zoledronate dose. ° °He knows to call for any other issues that may develop before the next visit. ° °,  C, MD  04/28/19 10:01 AM °Medical Oncology and Hematology °[facility] °[address] °[place], [place] [address] °Tel. [phone]    Fax. [phone]  ° °I, Amber Handy am acting as a scribe for  C, , MD.  ° °I,   MD, have reviewed the above documentation for accuracy and completeness, and I agree with the above. ° ° °

## 2019-04-29 ENCOUNTER — Telehealth: Payer: Self-pay | Admitting: Oncology

## 2019-04-29 LAB — PROTEIN ELECTROPHORESIS, SERUM
A/G Ratio: 1.4 (ref 0.7–1.7)
Albumin ELP: 3.8 g/dL (ref 2.9–4.4)
Alpha-1-Globulin: 0.2 g/dL (ref 0.0–0.4)
Alpha-2-Globulin: 0.7 g/dL (ref 0.4–1.0)
Beta Globulin: 0.8 g/dL (ref 0.7–1.3)
Gamma Globulin: 1.1 g/dL (ref 0.4–1.8)
Globulin, Total: 2.8 g/dL (ref 2.2–3.9)
Total Protein ELP: 6.6 g/dL (ref 6.0–8.5)

## 2019-04-29 LAB — KAPPA/LAMBDA LIGHT CHAINS
Kappa free light chain: 35.4 mg/L — ABNORMAL HIGH (ref 3.3–19.4)
Kappa, lambda light chain ratio: 1.25 (ref 0.26–1.65)
Lambda free light chains: 28.4 mg/L — ABNORMAL HIGH (ref 5.7–26.3)

## 2019-04-29 NOTE — Telephone Encounter (Signed)
I talk with patient regarding schedule, he prefers morning if possible

## 2019-05-12 ENCOUNTER — Other Ambulatory Visit: Payer: Self-pay | Admitting: Oncology

## 2019-05-19 ENCOUNTER — Other Ambulatory Visit: Payer: Self-pay

## 2019-05-19 ENCOUNTER — Inpatient Hospital Stay: Payer: 59

## 2019-05-19 ENCOUNTER — Ambulatory Visit: Payer: 59

## 2019-05-19 ENCOUNTER — Other Ambulatory Visit: Payer: 59

## 2019-05-19 VITALS — BP 96/70 | HR 79 | Temp 98.3°F | Resp 17

## 2019-05-19 DIAGNOSIS — Z5112 Encounter for antineoplastic immunotherapy: Secondary | ICD-10-CM | POA: Diagnosis not present

## 2019-05-19 DIAGNOSIS — Z9481 Bone marrow transplant status: Secondary | ICD-10-CM

## 2019-05-19 DIAGNOSIS — C9000 Multiple myeloma not having achieved remission: Secondary | ICD-10-CM

## 2019-05-19 LAB — CBC WITH DIFFERENTIAL (CANCER CENTER ONLY)
Abs Immature Granulocytes: 0.01 10*3/uL (ref 0.00–0.07)
Basophils Absolute: 0 10*3/uL (ref 0.0–0.1)
Basophils Relative: 0 %
Eosinophils Absolute: 0.1 10*3/uL (ref 0.0–0.5)
Eosinophils Relative: 1 %
HCT: 37.2 % — ABNORMAL LOW (ref 39.0–52.0)
Hemoglobin: 12.8 g/dL — ABNORMAL LOW (ref 13.0–17.0)
Immature Granulocytes: 0 %
Lymphocytes Relative: 18 %
Lymphs Abs: 1.4 10*3/uL (ref 0.7–4.0)
MCH: 36.1 pg — ABNORMAL HIGH (ref 26.0–34.0)
MCHC: 34.4 g/dL (ref 30.0–36.0)
MCV: 104.8 fL — ABNORMAL HIGH (ref 80.0–100.0)
Monocytes Absolute: 1 10*3/uL (ref 0.1–1.0)
Monocytes Relative: 14 %
Neutro Abs: 4.9 10*3/uL (ref 1.7–7.7)
Neutrophils Relative %: 67 %
Platelet Count: 100 10*3/uL — ABNORMAL LOW (ref 150–400)
RBC: 3.55 MIL/uL — ABNORMAL LOW (ref 4.22–5.81)
RDW: 13.5 % (ref 11.5–15.5)
WBC Count: 7.4 10*3/uL (ref 4.0–10.5)
nRBC: 0 % (ref 0.0–0.2)

## 2019-05-19 MED ORDER — PROCHLORPERAZINE MALEATE 10 MG PO TABS: 10.0000 mg | ORAL_TABLET | Freq: Once | ORAL | Status: DC

## 2019-05-19 MED ORDER — BORTEZOMIB CHEMO SQ INJECTION 3.5 MG (2.5MG/ML)
1.3000 mg/m2 | Freq: Once | INTRAMUSCULAR | Status: AC
  Administered 2019-05-19: 11:00:00 2.5 mg via SUBCUTANEOUS
  Filled 2019-05-19: qty 1

## 2019-05-19 NOTE — Patient Instructions (Signed)
[facility] Discharge Instructions for Patients Receiving Chemotherapy  Today you received the following chemotherapy agents Bortezomib (VELCADE).  To help prevent nausea and vomiting after your treatment, we encourage you to take your nausea medication as prescribed.   If you develop nausea and vomiting that is not controlled by your nausea medication, call the clinic.   BELOW ARE SYMPTOMS THAT SHOULD BE REPORTED IMMEDIATELY:  *FEVER GREATER THAN 100.5 F  *CHILLS WITH OR WITHOUT FEVER  NAUSEA AND VOMITING THAT IS NOT CONTROLLED WITH YOUR NAUSEA MEDICATION  *UNUSUAL SHORTNESS OF BREATH  *UNUSUAL BRUISING OR BLEEDING  TENDERNESS IN MOUTH AND THROAT WITH OR WITHOUT PRESENCE OF ULCERS  *URINARY PROBLEMS  *BOWEL PROBLEMS  UNUSUAL RASH Items with * indicate a potential emergency and should be followed up as soon as possible.  Feel free to call the clinic should you have any questions or concerns. The clinic phone number is [phone].  Please show the CHEMO ALERT CARD at check-in to the Emergency Department and triage nurse.   

## 2019-06-09 ENCOUNTER — Inpatient Hospital Stay: Payer: 59

## 2019-06-09 ENCOUNTER — Other Ambulatory Visit: Payer: Self-pay | Admitting: Oncology

## 2019-06-09 ENCOUNTER — Other Ambulatory Visit: Payer: Self-pay

## 2019-06-09 ENCOUNTER — Other Ambulatory Visit: Payer: 59

## 2019-06-09 ENCOUNTER — Ambulatory Visit: Payer: 59

## 2019-06-09 ENCOUNTER — Inpatient Hospital Stay: Payer: 59 | Attending: Oncology

## 2019-06-09 VITALS — BP 125/76 | HR 87 | Temp 97.8°F | Resp 16

## 2019-06-09 DIAGNOSIS — C9000 Multiple myeloma not having achieved remission: Secondary | ICD-10-CM

## 2019-06-09 DIAGNOSIS — Z23 Encounter for immunization: Secondary | ICD-10-CM

## 2019-06-09 DIAGNOSIS — Z5112 Encounter for antineoplastic immunotherapy: Secondary | ICD-10-CM | POA: Diagnosis not present

## 2019-06-09 DIAGNOSIS — Z9481 Bone marrow transplant status: Secondary | ICD-10-CM

## 2019-06-09 LAB — CMP (CANCER CENTER ONLY)
ALT: 20 U/L (ref 0–44)
AST: 25 U/L (ref 15–41)
Albumin: 3.8 g/dL (ref 3.5–5.0)
Alkaline Phosphatase: 76 U/L (ref 38–126)
Anion gap: 8 (ref 5–15)
BUN: 30 mg/dL — ABNORMAL HIGH (ref 8–23)
CO2: 22 mmol/L (ref 22–32)
Calcium: 9.1 mg/dL (ref 8.9–10.3)
Chloride: 103 mmol/L (ref 98–111)
Creatinine: 0.99 mg/dL (ref 0.61–1.24)
GFR, Est AFR Am: >60 mL/min (ref >60)
GFR, Estimated: >60 mL/min (ref >60)
Glucose, Bld: 86 mg/dL (ref 70–99)
Potassium: 4.3 mmol/L (ref 3.5–5.1)
Sodium: 133 mmol/L — ABNORMAL LOW (ref 135–145)
Total Bilirubin: 0.4 mg/dL (ref 0.3–1.2)
Total Protein: 6.9 g/dL (ref 6.5–8.1)

## 2019-06-09 LAB — CBC WITH DIFFERENTIAL (CANCER CENTER ONLY)
Abs Immature Granulocytes: 0.03 10*3/uL (ref 0.00–0.07)
Basophils Absolute: 0 10*3/uL (ref 0.0–0.1)
Basophils Relative: 0 %
Eosinophils Absolute: 0.1 10*3/uL (ref 0.0–0.5)
Eosinophils Relative: 1 %
HCT: 35.9 % — ABNORMAL LOW (ref 39.0–52.0)
Hemoglobin: 12.4 g/dL — ABNORMAL LOW (ref 13.0–17.0)
Immature Granulocytes: 1 %
Lymphocytes Relative: 20 %
Lymphs Abs: 1.2 10*3/uL (ref 0.7–4.0)
MCH: 36.9 pg — ABNORMAL HIGH (ref 26.0–34.0)
MCHC: 34.5 g/dL (ref 30.0–36.0)
MCV: 106.8 fL — ABNORMAL HIGH (ref 80.0–100.0)
Monocytes Absolute: 0.7 10*3/uL (ref 0.1–1.0)
Monocytes Relative: 12 %
Neutro Abs: 3.7 10*3/uL (ref 1.7–7.7)
Neutrophils Relative %: 66 %
Platelet Count: 96 10*3/uL — ABNORMAL LOW (ref 150–400)
RBC: 3.36 MIL/uL — ABNORMAL LOW (ref 4.22–5.81)
RDW: 13.6 % (ref 11.5–15.5)
WBC Count: 5.7 10*3/uL (ref 4.0–10.5)
nRBC: 0 % (ref 0.0–0.2)

## 2019-06-09 MED ORDER — PROCHLORPERAZINE MALEATE 10 MG PO TABS: 10.0000 mg | ORAL_TABLET | Freq: Once | ORAL | Status: DC

## 2019-06-09 MED ORDER — INFLUENZA VAC A&B SA ADJ QUAD 0.5 ML IM PRSY
0.5000 mL | PREFILLED_SYRINGE | Freq: Once | INTRAMUSCULAR | Status: AC
  Administered 2019-06-09: 0.5 mL via INTRAMUSCULAR

## 2019-06-09 MED ORDER — INFLUENZA VAC A&B SA ADJ QUAD 0.5 ML IM PRSY
PREFILLED_SYRINGE | INTRAMUSCULAR | Status: AC
  Filled 2019-06-09: qty 0.5

## 2019-06-09 MED ORDER — BORTEZOMIB CHEMO SQ INJECTION 3.5 MG (2.5MG/ML)
1.3000 mg/m2 | Freq: Once | INTRAMUSCULAR | Status: AC
  Administered 2019-06-09: 2.5 mg via SUBCUTANEOUS
  Filled 2019-06-09: qty 1

## 2019-06-09 NOTE — Progress Notes (Signed)
Per Dr. [name], ok to treat with platelets 96.

## 2019-06-09 NOTE — Patient Instructions (Addendum)
[facility] Discharge Instructions for Patients Receiving Chemotherapy  Today you received the following chemotherapy agents: Velcade.  To help prevent nausea and vomiting after your treatment, we encourage you to take your nausea medication as directed.   If you develop nausea and vomiting that is not controlled by your nausea medication, call the clinic.   BELOW ARE SYMPTOMS THAT SHOULD BE REPORTED IMMEDIATELY:  *FEVER GREATER THAN 100.5 F  *CHILLS WITH OR WITHOUT FEVER  NAUSEA AND VOMITING THAT IS NOT CONTROLLED WITH YOUR NAUSEA MEDICATION  *UNUSUAL SHORTNESS OF BREATH  *UNUSUAL BRUISING OR BLEEDING  TENDERNESS IN MOUTH AND THROAT WITH OR WITHOUT PRESENCE OF ULCERS  *URINARY PROBLEMS  *BOWEL PROBLEMS  UNUSUAL RASH Items with * indicate a potential emergency and should be followed up as soon as possible.  Feel free to call the clinic should you have any questions or concerns. The clinic phone number is [phone].  Please show the [place] at check-in to the Emergency Department and triage nurse.  Influenza Virus Vaccine injection What is this medicine? INFLUENZA VIRUS VACCINE (in floo EN zuh VAHY ruhs vak SEEN) helps to reduce the risk of getting influenza also known as the flu. The vaccine only helps protect you against some strains of the flu. This medicine may be used for other purposes; ask your health care provider or pharmacist if you have questions. COMMON BRAND NAME(S): Afluria, Afluria Quadrivalent, Agriflu, Alfuria, FLUAD, Fluarix, Fluarix Quadrivalent, Flublok, Flublok Quadrivalent, FLUCELVAX, Flulaval, Fluvirin, Fluzone, Fluzone High-Dose, Fluzone Intradermal What should I tell my health care provider before I take this medicine? They need to know if you have any of these conditions: bleeding disorder like hemophilia fever or infection Guillain-Barre syndrome or other neurological problems immune system problems infection with the  human immunodeficiency virus (HIV) or AIDS low blood platelet counts multiple sclerosis an unusual or allergic reaction to influenza virus vaccine, latex, other medicines, foods, dyes, or preservatives. Different brands of vaccines contain different allergens. Some may contain latex or eggs. Talk to your doctor about your allergies to make sure that you get the right vaccine. pregnant or trying to get pregnant breast-feeding How should I use this medicine? This vaccine is for injection into a muscle or under the skin. It is given by a health care professional. A copy of Vaccine Information Statements will be given before each vaccination. Read this sheet carefully each time. The sheet may change frequently. Talk to your healthcare provider to see which vaccines are right for you. Some vaccines should not be used in all age groups. Overdosage: If you think you have taken too much of this medicine contact a poison control center or emergency room at once. NOTE: This medicine is only for you. Do not share this medicine with others. What if I miss a dose? This does not apply. What may interact with this medicine? chemotherapy or radiation therapy medicines that lower your immune system like etanercept, anakinra, infliximab, and adalimumab medicines that treat or prevent blood clots like warfarin phenytoin steroid medicines like prednisone or cortisone theophylline vaccines This list may not describe all possible interactions. Give your health care provider a list of all the medicines, herbs, non-prescription drugs, or dietary supplements you use. Also tell them if you smoke, drink alcohol, or use illegal drugs. Some items may interact with your medicine. What should I watch for while using this medicine? Report any side effects that do not go away within 3 days to your doctor or  health care professional. Call your health care provider if any unusual symptoms occur within 6 weeks of receiving this  vaccine. You may still catch the flu, but the illness is not usually as bad. You cannot get the flu from the vaccine. The vaccine will not protect against colds or other illnesses that may cause fever. The vaccine is needed every year. What side effects may I notice from receiving this medicine? Side effects that you should report to your doctor or health care professional as soon as possible: allergic reactions like skin rash, itching or hives, swelling of the face, lips, or tongue Side effects that usually do not require medical attention (report to your doctor or health care professional if they continue or are bothersome): fever headache muscle aches and pains pain, tenderness, redness, or swelling at the injection site tiredness This list may not describe all possible side effects. Call your doctor for medical advice about side effects. You may report side effects to FDA at 1-800-FDA-1088. Where should I keep my medicine? The vaccine will be given by a health care professional in a clinic, pharmacy, doctor's office, or other health care setting. You will not be given vaccine doses to store at home. NOTE: This sheet is a summary. It may not cover all possible information. If you have questions about this medicine, talk to your doctor, pharmacist, or health care provider.  2020 Elsevier/Gold Standard (2018-07-30 08:45:43)

## 2019-06-10 LAB — KAPPA/LAMBDA LIGHT CHAINS
Kappa free light chain: 30.4 mg/L — ABNORMAL HIGH (ref 3.3–19.4)
Kappa, lambda light chain ratio: 0.94 (ref 0.26–1.65)
Lambda free light chains: 32.2 mg/L — ABNORMAL HIGH (ref 5.7–26.3)

## 2019-06-11 ENCOUNTER — Ambulatory Visit: Payer: 59 | Admitting: Podiatry

## 2019-06-11 LAB — PROTEIN ELECTROPHORESIS, SERUM
A/G Ratio: 1.2 (ref 0.7–1.7)
Albumin ELP: 3.6 g/dL (ref 2.9–4.4)
Alpha-1-Globulin: 0.2 g/dL (ref 0.0–0.4)
Alpha-2-Globulin: 0.7 g/dL (ref 0.4–1.0)
Beta Globulin: 0.9 g/dL (ref 0.7–1.3)
Gamma Globulin: 1.1 g/dL (ref 0.4–1.8)
Globulin, Total: 2.9 g/dL (ref 2.2–3.9)
Total Protein ELP: 6.5 g/dL (ref 6.0–8.5)

## 2019-06-13 ENCOUNTER — Encounter: Payer: Self-pay | Admitting: Oncology

## 2019-06-23 ENCOUNTER — Other Ambulatory Visit: Payer: Self-pay | Admitting: Oncology

## 2019-06-26 ENCOUNTER — Encounter: Payer: Self-pay | Admitting: Oncology

## 2019-06-30 ENCOUNTER — Inpatient Hospital Stay: Payer: 59 | Attending: Oncology

## 2019-06-30 ENCOUNTER — Other Ambulatory Visit: Payer: Self-pay

## 2019-06-30 ENCOUNTER — Other Ambulatory Visit: Payer: 59

## 2019-06-30 ENCOUNTER — Ambulatory Visit: Payer: 59

## 2019-06-30 ENCOUNTER — Inpatient Hospital Stay: Payer: 59

## 2019-06-30 VITALS — BP 109/75 | HR 84 | Temp 98.0°F | Resp 16 | Wt 179.0 lb

## 2019-06-30 DIAGNOSIS — C9000 Multiple myeloma not having achieved remission: Secondary | ICD-10-CM

## 2019-06-30 DIAGNOSIS — Z5112 Encounter for antineoplastic immunotherapy: Secondary | ICD-10-CM | POA: Diagnosis not present

## 2019-06-30 DIAGNOSIS — Z9481 Bone marrow transplant status: Secondary | ICD-10-CM

## 2019-06-30 DIAGNOSIS — C9002 Multiple myeloma in relapse: Secondary | ICD-10-CM

## 2019-06-30 LAB — CMP (CANCER CENTER ONLY)
ALT: 20 U/L (ref 0–44)
AST: 26 U/L (ref 15–41)
Albumin: 3.7 g/dL (ref 3.5–5.0)
Alkaline Phosphatase: 70 U/L (ref 38–126)
Anion gap: 8 (ref 5–15)
BUN: 39 mg/dL — ABNORMAL HIGH (ref 8–23)
CO2: 23 mmol/L (ref 22–32)
Calcium: 9.4 mg/dL (ref 8.9–10.3)
Chloride: 102 mmol/L (ref 98–111)
Creatinine: 1.08 mg/dL (ref 0.61–1.24)
GFR, Est AFR Am: >60 mL/min (ref >60)
GFR, Estimated: >60 mL/min (ref >60)
Glucose, Bld: 88 mg/dL (ref 70–99)
Potassium: 4.6 mmol/L (ref 3.5–5.1)
Sodium: 133 mmol/L — ABNORMAL LOW (ref 135–145)
Total Bilirubin: 0.4 mg/dL (ref 0.3–1.2)
Total Protein: 7 g/dL (ref 6.5–8.1)

## 2019-06-30 LAB — CBC WITH DIFFERENTIAL (CANCER CENTER ONLY)
Abs Immature Granulocytes: 0.01 10*3/uL (ref 0.00–0.07)
Basophils Absolute: 0 10*3/uL (ref 0.0–0.1)
Basophils Relative: 1 %
Eosinophils Absolute: 0.1 10*3/uL (ref 0.0–0.5)
Eosinophils Relative: 2 %
HCT: 36.8 % — ABNORMAL LOW (ref 39.0–52.0)
Hemoglobin: 12.7 g/dL — ABNORMAL LOW (ref 13.0–17.0)
Immature Granulocytes: 0 %
Lymphocytes Relative: 20 %
Lymphs Abs: 1.2 10*3/uL (ref 0.7–4.0)
MCH: 36.3 pg — ABNORMAL HIGH (ref 26.0–34.0)
MCHC: 34.5 g/dL (ref 30.0–36.0)
MCV: 105.1 fL — ABNORMAL HIGH (ref 80.0–100.0)
Monocytes Absolute: 0.9 10*3/uL (ref 0.1–1.0)
Monocytes Relative: 14 %
Neutro Abs: 3.8 10*3/uL (ref 1.7–7.7)
Neutrophils Relative %: 63 %
Platelet Count: 104 10*3/uL — ABNORMAL LOW (ref 150–400)
RBC: 3.5 MIL/uL — ABNORMAL LOW (ref 4.22–5.81)
RDW: 13.3 % (ref 11.5–15.5)
WBC Count: 6 10*3/uL (ref 4.0–10.5)
nRBC: 0 % (ref 0.0–0.2)

## 2019-06-30 MED ORDER — BORTEZOMIB CHEMO SQ INJECTION 3.5 MG (2.5MG/ML)
1.3000 mg/m2 | Freq: Once | INTRAMUSCULAR | Status: AC
  Administered 2019-06-30: 2.5 mg via SUBCUTANEOUS
  Filled 2019-06-30: qty 1

## 2019-06-30 MED ORDER — ZOLEDRONIC ACID 4 MG/100ML IV SOLN
4.0000 mg | Freq: Once | INTRAVENOUS | Status: AC
  Administered 2019-06-30: 4 mg via INTRAVENOUS
  Filled 2019-06-30: qty 100

## 2019-06-30 MED ORDER — SODIUM CHLORIDE 0.9 % IV SOLN
INTRAVENOUS | Status: DC
  Administered 2019-06-30: 11:00:00 via INTRAVENOUS
  Filled 2019-06-30: qty 250

## 2019-06-30 MED ORDER — PROCHLORPERAZINE MALEATE 10 MG PO TABS: 10.0000 mg | ORAL_TABLET | Freq: Once | ORAL | Status: DC

## 2019-06-30 NOTE — Patient Instructions (Signed)
Bortezomib injection What is this medicine? BORTEZOMIB (bor TEZ oh mib) is a medicine that targets proteins in cancer cells and stops the cancer cells from growing. It is used to treat multiple myeloma and mantle-cell lymphoma. This medicine may be used for other purposes; ask your health care provider or pharmacist if you have questions. COMMON BRAND NAME(S): Velcade What should I tell my health care provider before I take this medicine? They need to know if you have any of these conditions:  diabetes  heart disease  irregular heartbeat  liver disease  on hemodialysis  low blood counts, like low white blood cells, platelets, or hemoglobin  peripheral neuropathy  taking medicine for blood pressure  an unusual or allergic reaction to bortezomib, mannitol, boron, other medicines, foods, dyes, or preservatives  pregnant or trying to get pregnant  breast-feeding How should I use this medicine? This medicine is for injection into a vein or for injection under the skin. It is given by a health care professional in a hospital or clinic setting. Talk to your pediatrician regarding the use of this medicine in children. Special care may be needed. Overdosage: If you think you have taken too much of this medicine contact a poison control center or emergency room at once. NOTE: This medicine is only for you. Do not share this medicine with others. What if I miss a dose? It is important not to miss your dose. Call your doctor or health care professional if you are unable to keep an appointment. What may interact with this medicine? This medicine may interact with the following medications:  ketoconazole  rifampin  ritonavir  St. [name]'s Wort This list may not describe all possible interactions. Give your health care provider a list of all the medicines, herbs, non-prescription drugs, or dietary supplements you use. Also tell them if you smoke, drink alcohol, or use illegal drugs. Some  items may interact with your medicine. What should I watch for while using this medicine? You may get drowsy or dizzy. Do not drive, use machinery, or do anything that needs mental alertness until you know how this medicine affects you. Do not stand or sit up quickly, especially if you are an older patient. This reduces the risk of dizzy or fainting spells. In some cases, you may be given additional medicines to help with side effects. Follow all directions for their use. Call your doctor or health care professional for advice if you get a fever, chills or sore throat, or other symptoms of a cold or flu. Do not treat yourself. This drug decreases your body's ability to fight infections. Try to avoid being around people who are sick. This medicine may increase your risk to bruise or bleed. Call your doctor or health care professional if you notice any unusual bleeding. You may need blood work done while you are taking this medicine. In some patients, this medicine may cause a serious brain infection that may cause death. If you have any problems seeing, thinking, speaking, walking, or standing, tell your doctor right away. If you cannot reach your doctor, urgently seek other source of medical care. Check with your doctor or health care professional if you get an attack of severe diarrhea, nausea and vomiting, or if you sweat a lot. The loss of too much body fluid can make it dangerous for you to take this medicine. Do not become pregnant while taking this medicine or for at least 7 months after stopping it. Women should inform their doctor   if they wish to become pregnant or think they might be pregnant. Men should not father a child while taking this medicine and for at least 4 months after stopping it. There is a potential for serious side effects to an unborn child. Talk to your health care professional or pharmacist for more information. Do not breast-feed an infant while taking this medicine or for 2  months after stopping it. This medicine may interfere with the ability to have a child. You should talk with your doctor or health care professional if you are concerned about your fertility. What side effects may I notice from receiving this medicine? Side effects that you should report to your doctor or health care professional as soon as possible:  allergic reactions like skin rash, itching or hives, swelling of the face, lips, or tongue  breathing problems  changes in hearing  changes in vision  fast, irregular heartbeat  feeling faint or lightheaded, falls  pain, tingling, numbness in the hands or feet  right upper belly pain  seizures  swelling of the ankles, feet, hands  unusual bleeding or bruising  unusually weak or tired  vomiting  yellowing of the eyes or skin Side effects that usually do not require medical attention (report to your doctor or health care professional if they continue or are bothersome):  changes in emotions or moods  constipation  diarrhea  loss of appetite  headache  irritation at site where injected  nausea This list may not describe all possible side effects. Call your doctor for medical advice about side effects. You may report side effects to FDA at 1-800-FDA-1088. Where should I keep my medicine? This drug is given in a hospital or clinic and will not be stored at home. NOTE: This sheet is a summary. It may not cover all possible information. If you have questions about this medicine, talk to your doctor, pharmacist, or health care provider.  2020 Elsevier/Gold Standard (2018-01-14 16:29:31) Zoledronic Acid injection (Hypercalcemia, Oncology) What is this medicine? ZOLEDRONIC ACID (ZOE le dron ik AS id) lowers the amount of calcium loss from bone. It is used to treat too much calcium in your blood from cancer. It is also used to prevent complications of cancer that has spread to the bone. This medicine may be used for other  purposes; ask your health care provider or pharmacist if you have questions. COMMON BRAND NAME(S): Zometa What should I tell my health care provider before I take this medicine? They need to know if you have any of these conditions:  aspirin-sensitive asthma  cancer, especially if you are receiving medicines used to treat cancer  dental disease or wear dentures  infection  kidney disease  receiving corticosteroids like dexamethasone or prednisone  an unusual or allergic reaction to zoledronic acid, other medicines, foods, dyes, or preservatives  pregnant or trying to get pregnant  breast-feeding How should I use this medicine? This medicine is for infusion into a vein. It is given by a health care professional in a hospital or clinic setting. Talk to your pediatrician regarding the use of this medicine in children. Special care may be needed. Overdosage: If you think you have taken too much of this medicine contact a poison control center or emergency room at once. NOTE: This medicine is only for you. Do not share this medicine with others. What if I miss a dose? It is important not to miss your dose. Call your doctor or health care professional if you are unable  to keep an appointment. What may interact with this medicine?  certain antibiotics given by injection  NSAIDs, medicines for pain and inflammation, like ibuprofen or naproxen  some diuretics like bumetanide, furosemide  teriparatide  thalidomide This list may not describe all possible interactions. Give your health care provider a list of all the medicines, herbs, non-prescription drugs, or dietary supplements you use. Also tell them if you smoke, drink alcohol, or use illegal drugs. Some items may interact with your medicine. What should I watch for while using this medicine? Visit your doctor or health care professional for regular checkups. It may be some time before you see the benefit from this medicine. Do not  stop taking your medicine unless your doctor tells you to. Your doctor may order blood tests or other tests to see how you are doing. Women should inform their doctor if they wish to become pregnant or think they might be pregnant. There is a potential for serious side effects to an unborn child. Talk to your health care professional or pharmacist for more information. You should make sure that you get enough calcium and vitamin D while you are taking this medicine. Discuss the foods you eat and the vitamins you take with your health care professional. Some people who take this medicine have severe bone, joint, and/or muscle pain. This medicine may also increase your risk for jaw problems or a broken thigh bone. Tell your doctor right away if you have severe pain in your jaw, bones, joints, or muscles. Tell your doctor if you have any pain that does not go away or that gets worse. Tell your dentist and dental surgeon that you are taking this medicine. You should not have major dental surgery while on this medicine. See your dentist to have a dental exam and fix any dental problems before starting this medicine. Take good care of your teeth while on this medicine. Make sure you see your dentist for regular follow-up appointments. What side effects may I notice from receiving this medicine? Side effects that you should report to your doctor or health care professional as soon as possible:  allergic reactions like skin rash, itching or hives, swelling of the face, lips, or tongue  anxiety, confusion, or depression  breathing problems  changes in vision  eye pain  feeling faint or lightheaded, falls  jaw pain, especially after dental work  mouth sores  muscle cramps, stiffness, or weakness  redness, blistering, peeling or loosening of the skin, including inside the mouth  trouble passing urine or change in the amount of urine Side effects that usually do not require medical attention (report  to your doctor or health care professional if they continue or are bothersome):  bone, joint, or muscle pain  constipation  diarrhea  fever  hair loss  irritation at site where injected  loss of appetite  nausea, vomiting  stomach upset  trouble sleeping  trouble swallowing  weak or tired This list may not describe all possible side effects. Call your doctor for medical advice about side effects. You may report side effects to FDA at 1-800-FDA-1088. Where should I keep my medicine? This drug is given in a hospital or clinic and will not be stored at home. NOTE: This sheet is a summary. It may not cover all possible information. If you have questions about this medicine, talk to your doctor, pharmacist, or health care provider.  2020 Elsevier/Gold Standard (2014-01-31 14:19:39)

## 2019-07-01 LAB — PROTEIN ELECTROPHORESIS, SERUM
A/G Ratio: 1.2 (ref 0.7–1.7)
Albumin ELP: 3.6 g/dL (ref 2.9–4.4)
Alpha-1-Globulin: 0.2 g/dL (ref 0.0–0.4)
Alpha-2-Globulin: 0.7 g/dL (ref 0.4–1.0)
Beta Globulin: 1 g/dL (ref 0.7–1.3)
Gamma Globulin: 1.2 g/dL (ref 0.4–1.8)
Globulin, Total: 3.1 g/dL (ref 2.2–3.9)
M-Spike, %: 0.3 g/dL — ABNORMAL HIGH
Total Protein ELP: 6.7 g/dL (ref 6.0–8.5)

## 2019-07-01 LAB — KAPPA/LAMBDA LIGHT CHAINS
Kappa free light chain: 31.7 mg/L — ABNORMAL HIGH (ref 3.3–19.4)
Kappa, lambda light chain ratio: 1 (ref 0.26–1.65)
Lambda free light chains: 31.7 mg/L — ABNORMAL HIGH (ref 5.7–26.3)

## 2019-07-02 ENCOUNTER — Other Ambulatory Visit: Payer: Self-pay | Admitting: Internal Medicine

## 2019-07-03 ENCOUNTER — Encounter: Payer: Self-pay | Admitting: Oncology

## 2019-07-03 ENCOUNTER — Other Ambulatory Visit: Payer: Self-pay | Admitting: Oncology

## 2019-07-03 DIAGNOSIS — C9000 Multiple myeloma not having achieved remission: Secondary | ICD-10-CM

## 2019-07-12 ENCOUNTER — Other Ambulatory Visit: Payer: Self-pay | Admitting: Internal Medicine

## 2019-07-21 ENCOUNTER — Ambulatory Visit: Payer: 59

## 2019-07-21 ENCOUNTER — Other Ambulatory Visit: Payer: Self-pay | Admitting: Oncology

## 2019-07-21 ENCOUNTER — Inpatient Hospital Stay: Payer: 59

## 2019-07-21 ENCOUNTER — Inpatient Hospital Stay: Payer: 59 | Attending: Oncology

## 2019-07-21 ENCOUNTER — Other Ambulatory Visit: Payer: Self-pay

## 2019-07-21 ENCOUNTER — Other Ambulatory Visit: Payer: 59

## 2019-07-21 VITALS — BP 137/82 | HR 74 | Temp 97.4°F | Resp 17 | Wt 176.0 lb

## 2019-07-21 DIAGNOSIS — Z5112 Encounter for antineoplastic immunotherapy: Secondary | ICD-10-CM | POA: Insufficient documentation

## 2019-07-21 DIAGNOSIS — C9000 Multiple myeloma not having achieved remission: Secondary | ICD-10-CM

## 2019-07-21 DIAGNOSIS — Z9481 Bone marrow transplant status: Secondary | ICD-10-CM

## 2019-07-21 LAB — CBC WITH DIFFERENTIAL (CANCER CENTER ONLY)
Abs Immature Granulocytes: 0.02 10*3/uL (ref 0.00–0.07)
Basophils Absolute: 0 10*3/uL (ref 0.0–0.1)
Basophils Relative: 0 %
Eosinophils Absolute: 0.1 10*3/uL (ref 0.0–0.5)
Eosinophils Relative: 2 %
HCT: 34.3 % — ABNORMAL LOW (ref 39.0–52.0)
Hemoglobin: 11.8 g/dL — ABNORMAL LOW (ref 13.0–17.0)
Immature Granulocytes: 0 %
Lymphocytes Relative: 19 %
Lymphs Abs: 1.1 10*3/uL (ref 0.7–4.0)
MCH: 35.8 pg — ABNORMAL HIGH (ref 26.0–34.0)
MCHC: 34.4 g/dL (ref 30.0–36.0)
MCV: 103.9 fL — ABNORMAL HIGH (ref 80.0–100.0)
Monocytes Absolute: 0.7 10*3/uL (ref 0.1–1.0)
Monocytes Relative: 12 %
Neutro Abs: 4 10*3/uL (ref 1.7–7.7)
Neutrophils Relative %: 67 %
Platelet Count: 115 10*3/uL — ABNORMAL LOW (ref 150–400)
RBC: 3.3 MIL/uL — ABNORMAL LOW (ref 4.22–5.81)
RDW: 13.2 % (ref 11.5–15.5)
WBC Count: 6 10*3/uL (ref 4.0–10.5)
nRBC: 0 % (ref 0.0–0.2)

## 2019-07-21 MED ORDER — PROCHLORPERAZINE MALEATE 10 MG PO TABS: 10.0000 mg | ORAL_TABLET | Freq: Once | ORAL | Status: DC

## 2019-07-21 MED ORDER — BORTEZOMIB CHEMO SQ INJECTION 3.5 MG (2.5MG/ML)
1.3000 mg/m2 | Freq: Once | INTRAMUSCULAR | Status: AC
  Administered 2019-07-21: 11:00:00 2.5 mg via SUBCUTANEOUS
  Filled 2019-07-21: qty 1

## 2019-07-21 NOTE — Patient Instructions (Signed)
[facility] Discharge Instructions for Patients Receiving Chemotherapy  Today you received the following chemotherapy agents: Bortezomib (Velcade)  To help prevent nausea and vomiting after your treatment, we encourage you to take your nausea medication as directed.   If you develop nausea and vomiting that is not controlled by your nausea medication, call the clinic.   BELOW ARE SYMPTOMS THAT SHOULD BE REPORTED IMMEDIATELY:  *FEVER GREATER THAN 100.5 F  *CHILLS WITH OR WITHOUT FEVER  NAUSEA AND VOMITING THAT IS NOT CONTROLLED WITH YOUR NAUSEA MEDICATION  *UNUSUAL SHORTNESS OF BREATH  *UNUSUAL BRUISING OR BLEEDING  TENDERNESS IN MOUTH AND THROAT WITH OR WITHOUT PRESENCE OF ULCERS  *URINARY PROBLEMS  *BOWEL PROBLEMS  UNUSUAL RASH Items with * indicate a potential emergency and should be followed up as soon as possible.  Feel free to call the clinic should you have any questions or concerns. The clinic phone number is [phone].  Please show the CHEMO ALERT CARD at check-in to the Emergency Department and triage nurse.  Coronavirus (COVID-19) Are you at risk?  Are you at risk for the Coronavirus (COVID-19)?  To be considered HIGH RISK for Coronavirus (COVID-19), you have to meet the following criteria:  . Traveled to [place], [place], [place], [place] or [place]; or in the [place] to [place], [place], [place], or [place]; and have fever, cough, and shortness of breath within the last 2 weeks of travel OR . Been in close contact with a person diagnosed with COVID-19 within the last 2 weeks and have fever, cough, and shortness of breath . IF YOU DO NOT MEET THESE CRITERIA, YOU ARE CONSIDERED LOW RISK FOR COVID-19.  What to do if you are HIGH RISK for COVID-19?  . If you are having a medical emergency, call 911. . Seek medical care right away. Before you go to a doctor's office, urgent care or emergency department, call ahead and tell them  about your recent travel, contact with someone diagnosed with COVID-19, and your symptoms. You should receive instructions from your physician's office regarding next steps of care.  . When you arrive at healthcare provider, tell the healthcare staff immediately you have returned from visiting [place], [place], [place], [place] or [place]; or traveled in the [place] to [place], [place], [place], or [place]; in the last two weeks or you have been in close contact with a person diagnosed with COVID-19 in the last 2 weeks.   . Tell the health care staff about your symptoms: fever, cough and shortness of breath. . After you have been seen by a medical provider, you will be either: o Tested for (COVID-19) and discharged home on quarantine except to seek medical care if symptoms worsen, and asked to  - Stay home and avoid contact with others until you get your results (4-5 days)  - Avoid travel on public transportation if possible (such as bus, train, or airplane) or o Sent to the Emergency Department by EMS for evaluation, COVID-19 testing, and possible admission depending on your condition and test results.  What to do if you are LOW RISK for COVID-19?  Reduce your risk of any infection by using the same precautions used for avoiding the common cold or flu:  . Wash your hands often with soap and warm water for at least 20 seconds.  If soap and water are not readily available, use an alcohol-based hand sanitizer with at least 60% alcohol.  . If coughing or   sneezing, cover your mouth and nose by coughing or sneezing into the elbow areas of your shirt or coat, into a tissue or into your sleeve (not your hands). . Avoid shaking hands with others and consider head nods or verbal greetings only. . Avoid touching your eyes, nose, or mouth with unwashed hands.  . Avoid close contact with people who are sick. . Avoid places or events with large numbers of people in one location, like concerts or  sporting events. . Carefully consider travel plans you have or are making. . If you are planning any travel outside or inside the [place], visit the CDC's Travelers' Health webpage for the latest health notices. . If you have some symptoms but not all symptoms, continue to monitor at home and seek medical attention if your symptoms worsen. . If you are having a medical emergency, call 911.   ADDITIONAL HEALTHCARE OPTIONS FOR PATIENTS  [facility] / e-Visit: [email or web address]         [facility]: [phone]  [facility]: [phone]                   [facility]: [phone]   

## 2019-07-21 NOTE — Progress Notes (Signed)
Monthly CMP to be collected at next appointment on 08/11/19. Carolynn Serve aware

## 2019-07-22 LAB — PROTEIN ELECTROPHORESIS, SERUM
A/G Ratio: 1.3 (ref 0.7–1.7)
Albumin ELP: 3.7 g/dL (ref 2.9–4.4)
Alpha-1-Globulin: 0.2 g/dL (ref 0.0–0.4)
Alpha-2-Globulin: 0.6 g/dL (ref 0.4–1.0)
Beta Globulin: 0.9 g/dL (ref 0.7–1.3)
Gamma Globulin: 1.1 g/dL (ref 0.4–1.8)
Globulin, Total: 2.9 g/dL (ref 2.2–3.9)
Total Protein ELP: 6.6 g/dL (ref 6.0–8.5)

## 2019-07-31 ENCOUNTER — Telehealth: Payer: Self-pay

## 2019-07-31 ENCOUNTER — Encounter: Payer: Self-pay | Admitting: Oncology

## 2019-07-31 NOTE — Telephone Encounter (Signed)
Per MyChart msg pt is requesting to cancel Nov appt d/t holidays and wanting spend time with family. Reviewed with Dr [name].  Dr [name] does not advise pt to skip a treatment.  Pt verbalizes understanding.  High Priority msg sent to scheduling dept to move pt's lab and infusion appt up 1 wk per Dr Magrinat's suggestion to either 11/16 or 11/17. Pt instructed to expect call from schedulers to get appt's rescheduled.

## 2019-08-01 ENCOUNTER — Telehealth: Payer: Self-pay | Admitting: Oncology

## 2019-08-01 NOTE — Telephone Encounter (Signed)
R/s appt per 11/12 sch message - pt aware of appt date and time

## 2019-08-04 ENCOUNTER — Inpatient Hospital Stay: Payer: 59

## 2019-08-04 ENCOUNTER — Inpatient Hospital Stay (HOSPITAL_BASED_OUTPATIENT_CLINIC_OR_DEPARTMENT_OTHER): Payer: 59 | Admitting: Medical

## 2019-08-04 ENCOUNTER — Other Ambulatory Visit: Payer: Self-pay

## 2019-08-04 VITALS — BP 119/67 | HR 64 | Temp 98.7°F | Resp 18 | Ht 69.0 in | Wt 176.8 lb

## 2019-08-04 DIAGNOSIS — Z5112 Encounter for antineoplastic immunotherapy: Secondary | ICD-10-CM | POA: Diagnosis not present

## 2019-08-04 DIAGNOSIS — Z9481 Bone marrow transplant status: Secondary | ICD-10-CM

## 2019-08-04 DIAGNOSIS — C9000 Multiple myeloma not having achieved remission: Secondary | ICD-10-CM

## 2019-08-04 DIAGNOSIS — M779 Enthesopathy, unspecified: Secondary | ICD-10-CM

## 2019-08-04 LAB — CMP (CANCER CENTER ONLY)
ALT: 20 U/L (ref 0–44)
AST: 25 U/L (ref 15–41)
Albumin: 3.9 g/dL (ref 3.5–5.0)
Alkaline Phosphatase: 69 U/L (ref 38–126)
Anion gap: 10 (ref 5–15)
BUN: 34 mg/dL — ABNORMAL HIGH (ref 8–23)
CO2: 21 mmol/L — ABNORMAL LOW (ref 22–32)
Calcium: 9.6 mg/dL (ref 8.9–10.3)
Chloride: 105 mmol/L (ref 98–111)
Creatinine: 1.19 mg/dL (ref 0.61–1.24)
GFR, Est AFR Am: >60 mL/min (ref >60)
GFR, Estimated: >60 mL/min (ref >60)
Glucose, Bld: 95 mg/dL (ref 70–99)
Potassium: 4.7 mmol/L (ref 3.5–5.1)
Sodium: 136 mmol/L (ref 135–145)
Total Bilirubin: 0.4 mg/dL (ref 0.3–1.2)
Total Protein: 7.1 g/dL (ref 6.5–8.1)

## 2019-08-04 LAB — CBC WITH DIFFERENTIAL (CANCER CENTER ONLY)
Abs Immature Granulocytes: 0.02 10*3/uL (ref 0.00–0.07)
Basophils Absolute: 0 10*3/uL (ref 0.0–0.1)
Basophils Relative: 1 %
Eosinophils Absolute: 0.1 10*3/uL (ref 0.0–0.5)
Eosinophils Relative: 2 %
HCT: 35.4 % — ABNORMAL LOW (ref 39.0–52.0)
Hemoglobin: 12.1 g/dL — ABNORMAL LOW (ref 13.0–17.0)
Immature Granulocytes: 0 %
Lymphocytes Relative: 22 %
Lymphs Abs: 1.2 10*3/uL (ref 0.7–4.0)
MCH: 36.1 pg — ABNORMAL HIGH (ref 26.0–34.0)
MCHC: 34.2 g/dL (ref 30.0–36.0)
MCV: 105.7 fL — ABNORMAL HIGH (ref 80.0–100.0)
Monocytes Absolute: 0.8 10*3/uL (ref 0.1–1.0)
Monocytes Relative: 14 %
Neutro Abs: 3.4 10*3/uL (ref 1.7–7.7)
Neutrophils Relative %: 61 %
Platelet Count: 126 10*3/uL — ABNORMAL LOW (ref 150–400)
RBC: 3.35 MIL/uL — ABNORMAL LOW (ref 4.22–5.81)
RDW: 12.9 % (ref 11.5–15.5)
WBC Count: 5.5 10*3/uL (ref 4.0–10.5)
nRBC: 0 % (ref 0.0–0.2)

## 2019-08-04 MED ORDER — BORTEZOMIB CHEMO SQ INJECTION 3.5 MG (2.5MG/ML)
1.3000 mg/m2 | Freq: Once | INTRAMUSCULAR | Status: AC
  Administered 2019-08-04: 2.5 mg via SUBCUTANEOUS
  Filled 2019-08-04: qty 1

## 2019-08-04 NOTE — Progress Notes (Signed)
Pt took home supply of Compazine this AM before arrival.    Pt reports developing "bump a few days ago" in left axillary area. [name] at chair side to assess left axillary area for "bump".

## 2019-08-04 NOTE — Patient Instructions (Signed)
[facility] Discharge Instructions for Patients Receiving Chemotherapy  Today you received the following chemotherapy agents Velcade.  To help prevent nausea and vomiting after your treatment, we encourage you to take your nausea medication as directed.  If you develop nausea and vomiting that is not controlled by your nausea medication, call the clinic.   BELOW ARE SYMPTOMS THAT SHOULD BE REPORTED IMMEDIATELY:  *FEVER GREATER THAN 100.5 F  *CHILLS WITH OR WITHOUT FEVER  NAUSEA AND VOMITING THAT IS NOT CONTROLLED WITH YOUR NAUSEA MEDICATION  *UNUSUAL SHORTNESS OF BREATH  *UNUSUAL BRUISING OR BLEEDING  TENDERNESS IN MOUTH AND THROAT WITH OR WITHOUT PRESENCE OF ULCERS  *URINARY PROBLEMS  *BOWEL PROBLEMS  UNUSUAL RASH Items with * indicate a potential emergency and should be followed up as soon as possible.  Feel free to call the clinic should you have any questions or concerns. The clinic phone number is [phone].  Please show the CHEMO ALERT CARD at check-in to the Emergency Department and triage nurse.   

## 2019-08-05 ENCOUNTER — Encounter: Payer: Self-pay | Admitting: Oncology

## 2019-08-05 LAB — PROTEIN ELECTROPHORESIS, SERUM
A/G Ratio: 1.2 (ref 0.7–1.7)
Albumin ELP: 3.5 g/dL (ref 2.9–4.4)
Alpha-1-Globulin: 0.2 g/dL (ref 0.0–0.4)
Alpha-2-Globulin: 0.7 g/dL (ref 0.4–1.0)
Beta Globulin: 1 g/dL (ref 0.7–1.3)
Gamma Globulin: 1.1 g/dL (ref 0.4–1.8)
Globulin, Total: 3 g/dL (ref 2.2–3.9)
Total Protein ELP: 6.5 g/dL (ref 6.0–8.5)

## 2019-08-05 LAB — KAPPA/LAMBDA LIGHT CHAINS
Kappa free light chain: 29.1 mg/L — ABNORMAL HIGH (ref 3.3–19.4)
Kappa, lambda light chain ratio: 1.05 (ref 0.26–1.65)
Lambda free light chains: 27.8 mg/L — ABNORMAL HIGH (ref 5.7–26.3)

## 2019-08-05 NOTE — Progress Notes (Addendum)
Mr. Hoerig was seen in the infusion room today as he was being treated.  He reports having tenderness throughout his left medial upper extremity.  He exercises regularly and was lifting weights recently.  There is a area of tenderness along the left proximal, medial biceps.  Range of motion is intact.  No gross abnormalities are noted.  The patient was told to rest and take anti-inflammatories as needed.  Sandi Mealy, MHS, PA-C Physician Assistant

## 2019-08-11 ENCOUNTER — Ambulatory Visit: Payer: 59

## 2019-08-11 ENCOUNTER — Other Ambulatory Visit: Payer: 59

## 2019-08-21 ENCOUNTER — Ambulatory Visit (INDEPENDENT_AMBULATORY_CARE_PROVIDER_SITE_OTHER): Payer: 59 | Admitting: Orthotics

## 2019-08-21 ENCOUNTER — Other Ambulatory Visit: Payer: Self-pay

## 2019-08-21 DIAGNOSIS — M2141 Flat foot [pes planus] (acquired), right foot: Secondary | ICD-10-CM

## 2019-08-21 DIAGNOSIS — R2681 Unsteadiness on feet: Secondary | ICD-10-CM

## 2019-08-21 DIAGNOSIS — R609 Edema, unspecified: Secondary | ICD-10-CM

## 2019-08-21 DIAGNOSIS — M2142 Flat foot [pes planus] (acquired), left foot: Secondary | ICD-10-CM

## 2019-08-26 NOTE — Progress Notes (Signed)
Patient came in today to pick up Moore Balance Brace (Bilateral).   Patient was able to don brace independently and brace was check for fit to custom.   Patient was observed walking with brace and gait was improved as well as stability.   Patient was advised of care and wearing instructions.  Advised to notify practice if there were any issues; especially skin irritation.  

## 2019-08-29 ENCOUNTER — Other Ambulatory Visit: Payer: Self-pay | Admitting: *Deleted

## 2019-08-29 DIAGNOSIS — C9000 Multiple myeloma not having achieved remission: Secondary | ICD-10-CM

## 2019-08-31 ENCOUNTER — Other Ambulatory Visit: Payer: Self-pay | Admitting: Oncology

## 2019-09-01 ENCOUNTER — Inpatient Hospital Stay: Payer: 59

## 2019-09-01 ENCOUNTER — Inpatient Hospital Stay (HOSPITAL_BASED_OUTPATIENT_CLINIC_OR_DEPARTMENT_OTHER): Payer: 59 | Admitting: Oncology

## 2019-09-01 ENCOUNTER — Inpatient Hospital Stay: Payer: 59 | Attending: Oncology

## 2019-09-01 ENCOUNTER — Other Ambulatory Visit: Payer: Self-pay

## 2019-09-01 VITALS — BP 95/66 | HR 98 | Temp 98.3°F | Resp 17 | Ht 69.0 in | Wt 178.5 lb

## 2019-09-01 VITALS — BP 112/65

## 2019-09-01 DIAGNOSIS — R9089 Other abnormal findings on diagnostic imaging of central nervous system: Secondary | ICD-10-CM | POA: Diagnosis not present

## 2019-09-01 DIAGNOSIS — C9000 Multiple myeloma not having achieved remission: Secondary | ICD-10-CM

## 2019-09-01 DIAGNOSIS — Z5112 Encounter for antineoplastic immunotherapy: Secondary | ICD-10-CM | POA: Diagnosis present

## 2019-09-01 DIAGNOSIS — G620 Drug-induced polyneuropathy: Secondary | ICD-10-CM | POA: Insufficient documentation

## 2019-09-01 DIAGNOSIS — Z9481 Bone marrow transplant status: Secondary | ICD-10-CM | POA: Diagnosis not present

## 2019-09-01 DIAGNOSIS — T451X5A Adverse effect of antineoplastic and immunosuppressive drugs, initial encounter: Secondary | ICD-10-CM

## 2019-09-01 LAB — CBC WITH DIFFERENTIAL (CANCER CENTER ONLY)
Abs Immature Granulocytes: 0.03 10*3/uL (ref 0.00–0.07)
Basophils Absolute: 0 10*3/uL (ref 0.0–0.1)
Basophils Relative: 0 %
Eosinophils Absolute: 0.1 10*3/uL (ref 0.0–0.5)
Eosinophils Relative: 2 %
HCT: 34.3 % — ABNORMAL LOW (ref 39.0–52.0)
Hemoglobin: 11.6 g/dL — ABNORMAL LOW (ref 13.0–17.0)
Immature Granulocytes: 1 %
Lymphocytes Relative: 21 %
Lymphs Abs: 1.3 10*3/uL (ref 0.7–4.0)
MCH: 35 pg — ABNORMAL HIGH (ref 26.0–34.0)
MCHC: 33.8 g/dL (ref 30.0–36.0)
MCV: 103.6 fL — ABNORMAL HIGH (ref 80.0–100.0)
Monocytes Absolute: 0.7 10*3/uL (ref 0.1–1.0)
Monocytes Relative: 12 %
Neutro Abs: 4 10*3/uL (ref 1.7–7.7)
Neutrophils Relative %: 64 %
Platelet Count: 119 10*3/uL — ABNORMAL LOW (ref 150–400)
RBC: 3.31 MIL/uL — ABNORMAL LOW (ref 4.22–5.81)
RDW: 13.1 % (ref 11.5–15.5)
WBC Count: 6.1 10*3/uL (ref 4.0–10.5)
nRBC: 0 % (ref 0.0–0.2)

## 2019-09-01 LAB — CMP (CANCER CENTER ONLY)
ALT: 17 U/L (ref 0–44)
AST: 26 U/L (ref 15–41)
Albumin: 3.7 g/dL (ref 3.5–5.0)
Alkaline Phosphatase: 64 U/L (ref 38–126)
Anion gap: 12 (ref 5–15)
BUN: 40 mg/dL — ABNORMAL HIGH (ref 8–23)
CO2: 19 mmol/L — ABNORMAL LOW (ref 22–32)
Calcium: 8.9 mg/dL (ref 8.9–10.3)
Chloride: 104 mmol/L (ref 98–111)
Creatinine: 1.06 mg/dL (ref 0.61–1.24)
GFR, Est AFR Am: >60 mL/min (ref >60)
GFR, Estimated: >60 mL/min (ref >60)
Glucose, Bld: 105 mg/dL — ABNORMAL HIGH (ref 70–99)
Potassium: 4.6 mmol/L (ref 3.5–5.1)
Sodium: 135 mmol/L (ref 135–145)
Total Bilirubin: 0.3 mg/dL (ref 0.3–1.2)
Total Protein: 7 g/dL (ref 6.5–8.1)

## 2019-09-01 MED ORDER — BORTEZOMIB CHEMO SQ INJECTION 3.5 MG (2.5MG/ML)
1.3000 mg/m2 | Freq: Once | INTRAMUSCULAR | Status: AC
  Administered 2019-09-01: 2.5 mg via SUBCUTANEOUS
  Filled 2019-09-01: qty 1

## 2019-09-01 MED ORDER — PROCHLORPERAZINE MALEATE 10 MG PO TABS: 10.0000 mg | ORAL_TABLET | Freq: Once | ORAL | Status: DC

## 2019-09-01 NOTE — Progress Notes (Signed)
[place]  [phone] [phone]    ID: Andre [name]  DOB: 06/07/1951  MR#: [id number] CSN#: [id number]   Patient Care Team: Andre Anger, MD as PCP - General (Internal Medicine) Andre [name], Andre Dad, MD as Consulting Physician (Oncology) Andre Living, MD as Consulting Physician (Neurology) Andre Lewandowsky, MD as Consulting Physician (Internal Medicine) Andre Ducking, MD as Consulting Physician (Neurology) Andre [name], DDS as Referring Physician (Oral Surgery) OTHERS: Andre [name] DDS   CHIEF COMPLAINT: Multiple myeloma  CURRENT TREATMENT: bortezomib every 21 days, zoledronate every 12 weeks   INTERVAL HISTORY:  Andre [name] returns today for follow-up and treatment of his multiple myeloma.  Interval history is generally unremarkable.  He did not have a treatment last week because of the Thanksgiving's holiday intervening.  He continues on bortezomib.  He has been receiving this every 21 days since July.  He tolerates it well and he tells me currently he has essentially no peripheral neuropathy.  He has a little numbness in his toes at times.  He also continues on zolendronate, which is given every 12 weeks.  His most recent dose was 06/30/2019 so his next dose will be in January.  He has had no side effects from that medication  We are following his M spike and his kappa lambda ratio.  Current results show  Lab Results  Component Value Date   MSPIKE Not Observed 08/04/2019   MSPIKE Not Observed 07/21/2019   MSPIKE 0.3 (H) 06/30/2019   MSPIKE Not Observed 06/09/2019   MSPIKE Not Observed 04/28/2019   Results for Andre [name]" (MRN [id number]) as of 09/01/2019 05:43  Ref. Range 06/30/2019 09:55 06/30/2019 09:56 07/21/2019 09:44 08/04/2019 09:03 08/04/2019 09:04  Kappa, lamda light chain ratio Latest Ref Range: 0.26 - 1.65  1.00   1.05      REVIEW OF SYSTEMS: Andre [name] will be definitively retiring at the  end of this month.  He is already starting a very good exercise program and walking 3 days a week and doing bike indoor and weights the other 3 days.  1 day he is off.  This morning he felt a little dizzy after doing some weights.  He had some extra fluid and then he was on the bike 30 minutes.  When he came in today his blood pressure was a little bit low.  He is not on any diuretic or blood pressure medication at present.  He had no chest pain or pressure, no shortness of breath and in general a detailed review of systems today was noncontributory   HISTORY OF PRESENT ILLNESS:  From the original intake nodes:  Andre [name] was worked up for peptic ulcer disease in August of 2011, with significant bleeding and anemia.  The patient was Helicobacter pylori negative.  He received some epinephrine when he had his EGD and then started on Protonix.  [name] anemia slowly resolved so that by September, his hemoglobin was up to 10.9 and by earlier this month, his hemoglobin was up to 12.5.    As part of his general workup, he was found to have a slightly elevated globulin fraction.  In September, his total protein was 8.3 with an albumin of 3.8.  In January, the total protein was 8.4 and albumin 3.6.  With persistence of this slight abnormality, Dr. Olevia [name] obtained serum immunofixation and SPEP.  The SPTP showed an M-spike of 2.67 grams.  A total IgG was 4,190.  Total IgA  low at 28.  Total IgM low at 28 also.  The immunofixation showed a monoclonal IgG lambda paraprotein.  There were also monoclonal free lambda light chains present. With this information, the patient was referred for further evaluation.   A diagnosis of myeloma was confirmed by bone marrow biopsy and subsequebnt treatment is as detailed below   PAST MEDICAL HISTORY: Past Medical History:  Diagnosis Date  . Allergy   . Anemia   . Arthritis   . Asthma    no treatment x 20 years  . Depression   . Double vision    occurs at  times   . Duodenal ulcer   . GERD (gastroesophageal reflux disease)   . Hyperlipidemia   . Hypertension   . Hypothyroidism   . Multiple myeloma 07/04/2011  . Thyroid disease    Significant for peptic ulcer disease as noted above.  History of hyperlipidemia.  History of anxiety and depression.  History of GERD which is significantly improved with weight loss and history of reactive airway disease, possibly secondary to the GERD which also improved with weight loss.  There was a history of obesity, now much improved secondary to exercise and diet.    PAST SURGICAL HISTORY: Past Surgical History:  Procedure Laterality Date  . BONE MARROW TRANSPLANT  2011   for MM  . CARDIOLITE STUDY  11/25/2003   NORMAL  . TONSILLECTOMY      FAMILY HISTORY: Family History  Problem Relation Age of Onset  . Throat cancer Mother   . Hypertension Father   . Stroke Father   . Asthma Father   . Diabetes Father   . Pancreatic cancer Brother    [name] father died at the age of 50 following a stroke.  [name] mother died with cancer of the throat at the age of 2.   She had a history of depression and was a smoker.  Not clear how much alcohol she drank, according to the patient.  The patient had two brothers.  One died from the age of 97 from a "kidney problem."  The second one died at the age of 67 from pancreatic cancer. (This may have been duodenal cancer. 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  His wife of 37+ years was Andre [name], but they divorced. In 2016 he remarried, his current wife's name is Andre [name].  The patient has two daughters:  Andre [name], who works in [place], who had her second child April 2016. Both daughters live here in [place]. The patient is very close to his daughters.  Hensch first daughter, Andre [name],  born 03/06/2013, apparently has Weber/Osler/Rendu. The patient is not a church attender.  Andre [name] also participates in [place] even though actually there is no alcohol or drug problem in the family.  He participates in this group and he gets quite a bit of support from it.     ADVANCED DIRECTIVES: In place   HEALTH MAINTENANCE:  Social History   Tobacco Use  . Smoking status: Former Smoker    Years: 3.00    Quit date: 03/27/1969    Years since quitting: 50.4  . Smokeless tobacco: Never Used  Substance Use Topics  . Alcohol use: No  . Drug use: No    Colonoscopy:  PSA:  Sept 2014, normal/Dr. [name]  Lipid panel: Sept 2014/Dr. [name]  Allergies  Allergen Reactions  . Atorvastatin Other (See Comments)  . Rosuvastatin     Other reaction(s): Liver Disorder  . Crestor [Rosuvastatin Calcium]     ADVERSE EFFECTS ON LIVER  . Lipitor [Atorvastatin Calcium]   . Septra [Sulfamethoxazole-Trimethoprim] Rash   Current Outpatient Medications  Medication Sig Dispense Refill  . Celestine complex vitamins tablet Take 1 tablet by mouth daily. 100 tablet 3  . calcium-vitamin D (OSCAL WITH D) 500-200 MG-UNIT tablet Take 1 tablet by mouth daily.    . cetirizine (ZYRTEC) 10 MG tablet Take 10 mg by mouth daily.    . Cholecalciferol (VITAMIN D3) 2000 units capsule Take 1 capsule (2,000 Units total) by mouth daily. 100 capsule 3  . docusate sodium (COLACE) 100 MG capsule Take 100 mg by mouth daily as needed for mild constipation or moderate constipation.    [name] escitalopram (LEXAPRO) 10 MG tablet TAKE 1 TABLET BY MOUTH  DAILY 90 tablet 1  . famotidine (PEPCID) 40 MG tablet Take 1 tablet (40 mg total) by mouth daily. 90 tablet 3  . feeding supplement, ENSURE ENLIVE, (ENSURE ENLIVE) LIQD Take 237 mLs by mouth 2 (two) times daily between meals. 237 mL 12  . levothyroxine (SYNTHROID) 150 MCG tablet TAKE 1 TABLET BY MOUTH  EVERY MORNING ON AN EMPTY  STOMACH 90 tablet 3  . ondansetron (ZOFRAN) 8 MG tablet TAKE 1  TABLET BY MOUTH EVERY 8 HOURS AS NEEDED FOR NAUSEA OR VOMITING 20 tablet 0  . pantoprazole (PROTONIX) 40 MG tablet TAKE 1 TABLET BY MOUTH  TWICE A DAY BEFORE MEALS 180 tablet 3  . sildenafil (VIAGRA) 25 MG tablet Take 1 tablet (25 mg total) by mouth daily as needed for erectile dysfunction. 10 tablet 0  . valACYclovir (VALTREX) 1000 MG tablet TAKE 1 TABLET BY MOUTH  DAILY 90 tablet 3  . zolendronic acid (ZOMETA) 4 MG/5ML injection Inject 4 mg into the vein every 3 (three) months.     No current facility-administered medications for this visit.   Objective: Middle-aged white man who appears well  Vitals:   09/01/19 1204  BP: 95/66  Pulse: 98  Resp: 17  Temp: 98.3 F (36.8 C)  SpO2: 98%  Body mass index is 26.36 kg/m. Filed Weights   09/01/19 1204  Weight: 178 lb 8 oz (81 kg)  Repeat blood pressure during the visit was 102/72  Sclerae unicteric, EOMs intact Wearing a mask No cervical or supraclavicular adenopathy Lungs no rales or rhonchi Heart regular rate and rhythm, no murmur appreciated Abd soft, nontender, positive bowel sounds MSK no focal spinal tenderness, no upper extremity lymphedema Neuro: nonfocal, well oriented, appropriate affect   LAB RESULTS:  Appointment on 09/01/2019  Component Date Value Ref Range Status  . WBC Count 09/01/2019 6.1  4.0 - 10.5 K/uL Final  . RBC 09/01/2019 3.31* 4.22 - 5.81 MIL/uL Final  . Hemoglobin 09/01/2019 11.6* 13.0 - 17.0 g/dL Final  . HCT 09/01/2019 34.3* 39.0 - 52.0 % Final  . MCV 09/01/2019 103.6* 80.0 - 100.0 fL Final  . MCH 09/01/2019 35.0* 26.0 - 34.0 pg Final  . MCHC 09/01/2019 33.8  30.0 - 36.0 g/dL Final  . RDW 09/01/2019 13.1  11.5 - 15.5 % Final  . Platelet Count 09/01/2019 119* 150 - 400 K/uL Final  . nRBC 09/01/2019 0.0  0.0 - 0.2 % Final  . Neutrophils Relative % 09/01/2019 64  % Final  . Neutro Abs 09/01/2019 4.0  1.7 -  7.7 K/uL Final  . Lymphocytes Relative 09/01/2019 21  % Final  . Lymphs Abs 09/01/2019 1.3   0.7 - 4.0 K/uL Final  . Monocytes Relative 09/01/2019 12  % Final  . Monocytes Absolute 09/01/2019 0.7  0.1 - 1.0 K/uL Final  . Eosinophils Relative 09/01/2019 2  % Final  . Eosinophils Absolute 09/01/2019 0.1  0.0 - 0.5 K/uL Final  . Basophils Relative 09/01/2019 0  % Final  . Basophils Absolute 09/01/2019 0.0  0.0 - 0.1 K/uL Final  . Immature Granulocytes 09/01/2019 1  % Final  . Abs Immature Granulocytes 09/01/2019 0.03  0.00 - 0.07 K/uL Final   Performed at [name], [place] [address] [place], [place] [address]  . Sodium 09/01/2019 135  135 - 145 mmol/L Final  . Potassium 09/01/2019 4.6  3.5 - 5.1 mmol/L Final  . Chloride 09/01/2019 104  98 - 111 mmol/L Final  . CO2 09/01/2019 19* 22 - 32 mmol/L Final  . Glucose, Bld 09/01/2019 105* 70 - 99 mg/dL Final  . BUN 09/01/2019 40* 8 - 23 mg/dL Final  . Creatinine 09/01/2019 1.06  0.61 - 1.24 mg/dL Final  . Calcium 09/01/2019 8.9  8.9 - 10.3 mg/dL Final  . Total Protein 09/01/2019 7.0  6.5 - 8.1 g/dL Final  . Albumin 09/01/2019 3.7  3.5 - 5.0 g/dL Final  . AST 09/01/2019 26  15 - 41 U/L Final  . ALT 09/01/2019 17  0 - 44 U/L Final  . Alkaline Phosphatase 09/01/2019 64  38 - 126 U/L Final  . Total Bilirubin 09/01/2019 0.3  0.3 - 1.2 mg/dL Final  . GFR, Est Non Af Am 09/01/2019 >60  >60 mL/min Final  . GFR, Est AFR Am 09/01/2019 >60  >60 mL/min Final  . Anion gap 09/01/2019 12  5 - 15 Final   Performed at [name], [place] [address], [place], [place] [address]    STUDIES: No results found.    ASSESSMENT: 68 y.o. Andre [name] man with a history of multiple myeloma diagnosed February of 2012, with an initial M-spike of 2.6 g/dL, IFE showing an IgG lambda paraprotein and free lambda light chains in the urine. Cytogenetics showed trisomy 36. Bone marrow biopsy showed a 22% plasmacytosis. Treated with   (1) bortezomib (subcutaneously), lenalidomide, and dexamethasone, with repeat bone marrow  biopsy May of 2012 showing 10% plasmacytosis   (2) high-dose chemotherapy with BCNU and melphalan at [facility], followed by stem cell rescue July of 2012   (3) on zoledronic acid started December of 2012, initially monthly, currently given every 3 months, most recent dose  12/07/2015  (4) low-dose lenalidomide resumed April 2013, interrupted several times.  Resumed again on 02/19/2013, ata dose of 5 mg daily, 21 days on and 7 days off, later further reduced to 7 days on, 7 days off  (5) CNS symptoms and abnormal brain MRI extensively evaluated by neurology with no definitive diagnosis established  (6) rising M spike noted June 2014 but did not meet criteria for carfilzomib study  (7) on lenalidomide 25 mg daily, 14 days on, 7 days off, starting 04/18/2013, interrupted December 2014 because of rash;   (a) resumed January 2015 at 10 mg/ day at 21 days on/ 7 days off  (Uriel) starting 08/18/2014 decreased to 10 mg/ day 14 days on, 7 days off because of cytopenias  (c) as of February 2016 was on 5 mg daily 7 days on 7 days off  (d) lenalidomide  discontinued December 2016 with evidence of disease progression  (8) transient global amnesia 05/29/2015, resolved without intervention  (9) starting PVD 10/25/2015 w ASA 325 thromboprophylaxis, valacyclovir prophylaxis, last dose 12/17/2015  (a) pomalidomide 4 mg/d days 1-14  (Jawann) bortezomib sQ days 2,5,9,12 of each 21 day cycle  (c) dexamethasome 20 mg two days a week  (d) dexamethasone bortezomib and pomalidomide discontinued late December 2018 with poor tolerance  (10) metapneumovirus pneumonia April 2017  (a) completing course of steroids and week of bactrim mid April 2017  (11) status post second autologous transplant at [facility] 02/04/2016(preparatory regimen melphalan 200 mg/m)  (a) received twelve-month vaccinations 03/14/2017 (DPT, Haemophilus, Pneumovax 13, polio)  (Jodey) 14 months injections 05/04/2016 include DTaP, Hib conjugate, HepB energex Levell 20  mcg/ml, Prevnar 13  (c) 24 month vaccines due at [name] June 2019  (12) maintenance therapy started November 2017, consisting of  (a) bortezomib 1.3 mg/M2 every 14 days, first dose 07/27/2016  (Alice) pomalidomide 1 mg days 1-21 Q28 days, started 07/19/2016  (c) zolendronate monthly started 07/27/2016 (previously Q 3 months) however patient unable to tolerate, and changed back to q3 months in April, 2018   (d) Bortezomib changed to monthly as of June 2018 because of tolerance issues, however discontinued after 09/14/2017 dose because of a rise in his M spike  (e) pomalidomide held after 10/18/2017 in preparation for possible study at [name] (venetoclax)--never resumed  (f) with numbers actually improved off treatment, resumed every 2-week bortezomib 12/25/2017  (g) changed to every 3-week bortezomib as of 03/17/2019  PLAN:  Andre [name] is now 3-1/2 years out from his second autologous transplant for multiple myeloma.  He continues in remission while receiving maintenance therapy.  This is very favorable.  Of course he would like to go off treatment completely.  I do not anticipate that anytime in the near future.  He is finally retiring at the end of this month.  He is already started a good exercise program.  Once the pandemic is over it he is planning to do some traveling.  Today we discussed the vaccine and I urged him to receive it once becomes available to him.  Once that is completed we will check for antibodies  He will receive zoledronate 09/21/2018 and he will see me in April with his next zoledronate dose  He knows to call for any other issue that may develop before the next visit.  Andre [name], Andre Dad, MD  09/01/19 12:33 PM Medical Oncology and Hematology [name] [place], [place] [address] Tel. [phone]    Fax. [phone]    I, Andre [name], am acting as scribe for Dr. Virgie [name]. Andre [name].  I, Lurline Del MD, have reviewed the above  documentation for accuracy and completeness, and I agree with the above.

## 2019-09-01 NOTE — Progress Notes (Signed)
Per Dr. [name], pt. to get Velcade injection today and Zometa rescheduled for 09/22/2019.

## 2019-09-02 ENCOUNTER — Telehealth: Payer: Self-pay | Admitting: Oncology

## 2019-09-02 ENCOUNTER — Other Ambulatory Visit: Payer: Self-pay | Admitting: *Deleted

## 2019-09-02 ENCOUNTER — Encounter: Payer: Self-pay | Admitting: Oncology

## 2019-09-02 LAB — PROTEIN ELECTROPHORESIS, SERUM, WITH REFLEX
A/G Ratio: 1.1 (ref 0.7–1.7)
Albumin ELP: 3.5 g/dL (ref 2.9–4.4)
Alpha-1-Globulin: 0.2 g/dL (ref 0.0–0.4)
Alpha-2-Globulin: 0.7 g/dL (ref 0.4–1.0)
Beta Globulin: 1 g/dL (ref 0.7–1.3)
Gamma Globulin: 1.1 g/dL (ref 0.4–1.8)
Globulin, Total: 3.1 g/dL (ref 2.2–3.9)
Total Protein ELP: 6.6 g/dL (ref 6.0–8.5)

## 2019-09-02 LAB — KAPPA/LAMBDA LIGHT CHAINS
Kappa free light chain: 27.1 mg/L — ABNORMAL HIGH (ref 3.3–19.4)
Kappa, lambda light chain ratio: 1.08 (ref 0.26–1.65)
Lambda free light chains: 25.1 mg/L (ref 5.7–26.3)

## 2019-09-02 MED ORDER — ONDANSETRON HCL 8 MG PO TABS: 8.0000 mg | ORAL_TABLET | Freq: Three times a day (TID) | ORAL | 3 refills | Status: DC | PRN

## 2019-09-02 NOTE — Telephone Encounter (Signed)
Left a message regarding schedule

## 2019-09-22 ENCOUNTER — Inpatient Hospital Stay: Payer: Medicare Other | Attending: Oncology

## 2019-09-22 ENCOUNTER — Other Ambulatory Visit: Payer: Self-pay

## 2019-09-22 ENCOUNTER — Inpatient Hospital Stay: Payer: Medicare Other

## 2019-09-22 VITALS — BP 117/69 | HR 70 | Temp 98.2°F | Resp 18 | Ht 69.0 in | Wt 178.5 lb

## 2019-09-22 DIAGNOSIS — Z5112 Encounter for antineoplastic immunotherapy: Secondary | ICD-10-CM | POA: Diagnosis present

## 2019-09-22 DIAGNOSIS — Z9481 Bone marrow transplant status: Secondary | ICD-10-CM

## 2019-09-22 DIAGNOSIS — C9000 Multiple myeloma not having achieved remission: Secondary | ICD-10-CM | POA: Insufficient documentation

## 2019-09-22 DIAGNOSIS — Z79899 Other long term (current) drug therapy: Secondary | ICD-10-CM | POA: Diagnosis not present

## 2019-09-22 DIAGNOSIS — C9002 Multiple myeloma in relapse: Secondary | ICD-10-CM

## 2019-09-22 LAB — CBC WITH DIFFERENTIAL (CANCER CENTER ONLY)
Abs Immature Granulocytes: 0.01 10*3/uL (ref 0.00–0.07)
Basophils Absolute: 0 10*3/uL (ref 0.0–0.1)
Basophils Relative: 0 %
Eosinophils Absolute: 0.1 10*3/uL (ref 0.0–0.5)
Eosinophils Relative: 2 %
HCT: 33.3 % — ABNORMAL LOW (ref 39.0–52.0)
Hemoglobin: 11.6 g/dL — ABNORMAL LOW (ref 13.0–17.0)
Immature Granulocytes: 0 %
Lymphocytes Relative: 24 %
Lymphs Abs: 1.3 10*3/uL (ref 0.7–4.0)
MCH: 35.2 pg — ABNORMAL HIGH (ref 26.0–34.0)
MCHC: 34.8 g/dL (ref 30.0–36.0)
MCV: 100.9 fL — ABNORMAL HIGH (ref 80.0–100.0)
Monocytes Absolute: 0.8 10*3/uL (ref 0.1–1.0)
Monocytes Relative: 16 %
Neutro Abs: 3 10*3/uL (ref 1.7–7.7)
Neutrophils Relative %: 58 %
Platelet Count: 105 10*3/uL — ABNORMAL LOW (ref 150–400)
RBC: 3.3 MIL/uL — ABNORMAL LOW (ref 4.22–5.81)
RDW: 13.1 % (ref 11.5–15.5)
WBC Count: 5.2 10*3/uL (ref 4.0–10.5)
nRBC: 0 % (ref 0.0–0.2)

## 2019-09-22 MED ORDER — ZOLEDRONIC ACID 4 MG/100ML IV SOLN
INTRAVENOUS | Status: AC
  Filled 2019-09-22: qty 100

## 2019-09-22 MED ORDER — ZOLEDRONIC ACID 4 MG/5ML IV CONC: 4.0000 mg | Freq: Once | INTRAVENOUS | Status: DC

## 2019-09-22 MED ORDER — ZOLEDRONIC ACID 4 MG/100ML IV SOLN
4.0000 mg | Freq: Once | INTRAVENOUS | Status: AC
  Administered 2019-09-22: 10:00:00 4 mg via INTRAVENOUS

## 2019-09-22 MED ORDER — PROCHLORPERAZINE MALEATE 10 MG PO TABS: 10.0000 mg | ORAL_TABLET | Freq: Once | ORAL | Status: DC

## 2019-09-22 MED ORDER — BORTEZOMIB CHEMO SQ INJECTION 3.5 MG (2.5MG/ML)
1.3000 mg/m2 | Freq: Once | INTRAMUSCULAR | Status: AC
  Administered 2019-09-22: 2.5 mg via SUBCUTANEOUS
  Filled 2019-09-22: qty 1

## 2019-09-22 NOTE — Patient Instructions (Signed)
[facility] Discharge Instructions for Patients Receiving Chemotherapy  Today you received the following chemotherapy agents Velcade  To help prevent nausea and vomiting after your treatment, we encourage you to take your nausea medication as directed.    If you develop nausea and vomiting that is not controlled by your nausea medication, call the clinic.   BELOW ARE SYMPTOMS THAT SHOULD BE REPORTED IMMEDIATELY:  *FEVER GREATER THAN 100.5 F  *CHILLS WITH OR WITHOUT FEVER  NAUSEA AND VOMITING THAT IS NOT CONTROLLED WITH YOUR NAUSEA MEDICATION  *UNUSUAL SHORTNESS OF BREATH  *UNUSUAL BRUISING OR BLEEDING  TENDERNESS IN MOUTH AND THROAT WITH OR WITHOUT PRESENCE OF ULCERS  *URINARY PROBLEMS  *BOWEL PROBLEMS  UNUSUAL RASH Items with * indicate a potential emergency and should be followed up as soon as possible.  Feel free to call the clinic should you have any questions or concerns. The clinic phone number is [phone].  Please show the CHEMO ALERT CARD at check-in to the Emergency Department and triage nurse.   Zoledronic Acid injection (Hypercalcemia, Oncology) What is this medicine? ZOLEDRONIC ACID (ZOE le dron ik AS id) lowers the amount of calcium loss from bone. It is used to treat too much calcium in your blood from cancer. It is also used to prevent complications of cancer that has spread to the bone. This medicine may be used for other purposes; ask your health care provider or pharmacist if you have questions. COMMON BRAND NAME(S): Zometa What should I tell my health care provider before I take this medicine? They need to know if you have any of these conditions:  aspirin-sensitive asthma  cancer, especially if you are receiving medicines used to treat cancer  dental disease or wear dentures  infection  kidney disease  receiving corticosteroids like dexamethasone or prednisone  an unusual or allergic reaction to zoledronic acid, other  medicines, foods, dyes, or preservatives  pregnant or trying to get pregnant  breast-feeding How should I use this medicine? This medicine is for infusion into a vein. It is given by a health care professional in a hospital or clinic setting. Talk to your pediatrician regarding the use of this medicine in children. Special care may be needed. Overdosage: If you think you have taken too much of this medicine contact a poison control center or emergency room at once. NOTE: This medicine is only for you. Do not share this medicine with others. What if I miss a dose? It is important not to miss your dose. Call your doctor or health care professional if you are unable to keep an appointment. What may interact with this medicine?  certain antibiotics given by injection  NSAIDs, medicines for pain and inflammation, like ibuprofen or naproxen  some diuretics like bumetanide, furosemide  teriparatide  thalidomide This list may not describe all possible interactions. Give your health care provider a list of all the medicines, herbs, non-prescription drugs, or dietary supplements you use. Also tell them if you smoke, drink alcohol, or use illegal drugs. Some items may interact with your medicine. What should I watch for while using this medicine? Visit your doctor or health care professional for regular checkups. It may be some time before you see the benefit from this medicine. Do not stop taking your medicine unless your doctor tells you to. Your doctor may order blood tests or other tests to see how you are doing. Women should inform their doctor if they wish to become pregnant or think they might   be pregnant. There is a potential for serious side effects to an unborn child. Talk to your health care professional or pharmacist for more information. You should make sure that you get enough calcium and vitamin D while you are taking this medicine. Discuss the foods you eat and the vitamins you take  with your health care professional. Some people who take this medicine have severe bone, joint, and/or muscle pain. This medicine may also increase your risk for jaw problems or a broken thigh bone. Tell your doctor right away if you have severe pain in your jaw, bones, joints, or muscles. Tell your doctor if you have any pain that does not go away or that gets worse. Tell your dentist and dental surgeon that you are taking this medicine. You should not have major dental surgery while on this medicine. See your dentist to have a dental exam and fix any dental problems before starting this medicine. Take good care of your teeth while on this medicine. Make sure you see your dentist for regular follow-up appointments. What side effects may I notice from receiving this medicine? Side effects that you should report to your doctor or health care professional as soon as possible:  allergic reactions like skin rash, itching or hives, swelling of the face, lips, or tongue  anxiety, confusion, or depression  breathing problems  changes in vision  eye pain  feeling faint or lightheaded, falls  jaw pain, especially after dental work  mouth sores  muscle cramps, stiffness, or weakness  redness, blistering, peeling or loosening of the skin, including inside the mouth  trouble passing urine or change in the amount of urine Side effects that usually do not require medical attention (report to your doctor or health care professional if they continue or are bothersome):  bone, joint, or muscle pain  constipation  diarrhea  fever  hair loss  irritation at site where injected  loss of appetite  nausea, vomiting  stomach upset  trouble sleeping  trouble swallowing  weak or tired This list may not describe all possible side effects. Call your doctor for medical advice about side effects. You may report side effects to FDA at 1-800-FDA-1088. Where should I keep my medicine? This drug  is given in a hospital or clinic and will not be stored at home. NOTE: This sheet is a summary. It may not cover all possible information. If you have questions about this medicine, talk to your doctor, pharmacist, or health care provider.  2020 Elsevier/Gold Standard (2014-01-31 14:19:39)  

## 2019-09-23 LAB — KAPPA/LAMBDA LIGHT CHAINS
Kappa free light chain: 30.1 mg/L — ABNORMAL HIGH (ref 3.3–19.4)
Kappa, lambda light chain ratio: 1.13 (ref 0.26–1.65)
Lambda free light chains: 26.7 mg/L — ABNORMAL HIGH (ref 5.7–26.3)

## 2019-09-23 LAB — PROTEIN ELECTROPHORESIS, SERUM
A/G Ratio: 1.1 (ref 0.7–1.7)
Albumin ELP: 3.4 g/dL (ref 2.9–4.4)
Alpha-1-Globulin: 0.2 g/dL (ref 0.0–0.4)
Alpha-2-Globulin: 0.7 g/dL (ref 0.4–1.0)
Beta Globulin: 1 g/dL (ref 0.7–1.3)
Gamma Globulin: 1.1 g/dL (ref 0.4–1.8)
Globulin, Total: 3 g/dL (ref 2.2–3.9)
Total Protein ELP: 6.4 g/dL (ref 6.0–8.5)

## 2019-10-07 ENCOUNTER — Telehealth: Payer: Self-pay | Admitting: *Deleted

## 2019-10-07 ENCOUNTER — Telehealth: Payer: Medicare Other

## 2019-10-07 MED ORDER — HYDROCORTISONE ACETATE 25 MG RE SUPP: 25.0000 mg | Freq: Two times a day (BID) | RECTAL | 1 refills | Status: DC

## 2019-10-07 NOTE — Telephone Encounter (Signed)
This RN spoke with pt per his call stating concerns over current issues with hemorrhoids and hip pain.  Andre [name] states he has had issues with hemorrhoids ongoing x 2 weeks, treating with preparation H and soft wipes.  He states he has some mild bleeding with primary issue being " itching terrible " and interfering with ADL's.  He states he has noticed intermittent hip pain ( right ) recently.  This RN discussed above with including home remedies for hemorrhoids including sitz baths and witch hazel for comfort as well as need to get stools as soft as possible with use of increased fluid, increased fiber as well as stool softeners.  Anusol suppository prescription obtained from MD and sent to pt's pharmacy.  This RN discussed hip pain may be related to hemorrhoid issue- and to best evaluate after benefit from hemorrhoid intervention.  Andre [name] will call with update next week.  No further needs at this time.

## 2019-10-10 ENCOUNTER — Other Ambulatory Visit: Payer: Self-pay | Admitting: *Deleted

## 2019-10-10 DIAGNOSIS — C9000 Multiple myeloma not having achieved remission: Secondary | ICD-10-CM

## 2019-10-13 ENCOUNTER — Other Ambulatory Visit: Payer: 59

## 2019-10-13 ENCOUNTER — Inpatient Hospital Stay: Payer: Medicare Other

## 2019-10-13 ENCOUNTER — Ambulatory Visit: Payer: 59

## 2019-10-13 ENCOUNTER — Other Ambulatory Visit: Payer: Self-pay

## 2019-10-13 VITALS — BP 121/71 | HR 71 | Temp 97.8°F | Resp 18

## 2019-10-13 DIAGNOSIS — C9000 Multiple myeloma not having achieved remission: Secondary | ICD-10-CM

## 2019-10-13 DIAGNOSIS — Z9481 Bone marrow transplant status: Secondary | ICD-10-CM

## 2019-10-13 DIAGNOSIS — Z5112 Encounter for antineoplastic immunotherapy: Secondary | ICD-10-CM | POA: Diagnosis not present

## 2019-10-13 LAB — CBC WITH DIFFERENTIAL (CANCER CENTER ONLY)
Abs Immature Granulocytes: 0.01 10*3/uL (ref 0.00–0.07)
Basophils Absolute: 0 10*3/uL (ref 0.0–0.1)
Basophils Relative: 0 %
Eosinophils Absolute: 0.1 10*3/uL (ref 0.0–0.5)
Eosinophils Relative: 2 %
HCT: 34.3 % — ABNORMAL LOW (ref 39.0–52.0)
Hemoglobin: 11.6 g/dL — ABNORMAL LOW (ref 13.0–17.0)
Immature Granulocytes: 0 %
Lymphocytes Relative: 22 %
Lymphs Abs: 1.2 10*3/uL (ref 0.7–4.0)
MCH: 33.8 pg (ref 26.0–34.0)
MCHC: 33.8 g/dL (ref 30.0–36.0)
MCV: 100 fL (ref 80.0–100.0)
Monocytes Absolute: 0.8 10*3/uL (ref 0.1–1.0)
Monocytes Relative: 14 %
Neutro Abs: 3.6 10*3/uL (ref 1.7–7.7)
Neutrophils Relative %: 62 %
Platelet Count: 99 10*3/uL — ABNORMAL LOW (ref 150–400)
RBC: 3.43 MIL/uL — ABNORMAL LOW (ref 4.22–5.81)
RDW: 14 % (ref 11.5–15.5)
WBC Count: 5.7 10*3/uL (ref 4.0–10.5)
nRBC: 0 % (ref 0.0–0.2)

## 2019-10-13 MED ORDER — PROCHLORPERAZINE MALEATE 10 MG PO TABS: 10.0000 mg | ORAL_TABLET | Freq: Once | ORAL | Status: DC

## 2019-10-13 MED ORDER — BORTEZOMIB CHEMO SQ INJECTION 3.5 MG (2.5MG/ML)
1.3000 mg/m2 | Freq: Once | INTRAMUSCULAR | Status: AC
  Administered 2019-10-13: 2.5 mg via SUBCUTANEOUS
  Filled 2019-10-13: qty 1

## 2019-10-13 NOTE — Progress Notes (Signed)
Okay to treat today with plt 99 and no CMP per Dr. [name]

## 2019-10-13 NOTE — Patient Instructions (Signed)
[facility] Discharge Instructions for Patients Receiving Chemotherapy  Today you received the following chemotherapy agents Velcade.  To help prevent nausea and vomiting after your treatment, we encourage you to take your nausea medication as directed.  If you develop nausea and vomiting that is not controlled by your nausea medication, call the clinic.   BELOW ARE SYMPTOMS THAT SHOULD BE REPORTED IMMEDIATELY:  *FEVER GREATER THAN 100.5 F  *CHILLS WITH OR WITHOUT FEVER  NAUSEA AND VOMITING THAT IS NOT CONTROLLED WITH YOUR NAUSEA MEDICATION  *UNUSUAL SHORTNESS OF BREATH  *UNUSUAL BRUISING OR BLEEDING  TENDERNESS IN MOUTH AND THROAT WITH OR WITHOUT PRESENCE OF ULCERS  *URINARY PROBLEMS  *BOWEL PROBLEMS  UNUSUAL RASH Items with * indicate a potential emergency and should be followed up as soon as possible.  Feel free to call the clinic should you have any questions or concerns. The clinic phone number is [phone].  Please show the CHEMO ALERT CARD at check-in to the Emergency Department and triage nurse.   

## 2019-10-14 LAB — KAPPA/LAMBDA LIGHT CHAINS
Kappa free light chain: 28.9 mg/L — ABNORMAL HIGH (ref 3.3–19.4)
Kappa, lambda light chain ratio: 1.16 (ref 0.26–1.65)
Lambda free light chains: 24.9 mg/L (ref 5.7–26.3)

## 2019-10-14 LAB — PROTEIN ELECTROPHORESIS, SERUM
A/G Ratio: 1.3 (ref 0.7–1.7)
Albumin ELP: 3.7 g/dL (ref 2.9–4.4)
Alpha-1-Globulin: 0.2 g/dL (ref 0.0–0.4)
Alpha-2-Globulin: 0.7 g/dL (ref 0.4–1.0)
Beta Globulin: 1 g/dL (ref 0.7–1.3)
Gamma Globulin: 1 g/dL (ref 0.4–1.8)
Globulin, Total: 2.8 g/dL (ref 2.2–3.9)
Total Protein ELP: 6.5 g/dL (ref 6.0–8.5)

## 2019-10-15 ENCOUNTER — Encounter: Payer: Self-pay | Admitting: Oncology

## 2019-10-16 ENCOUNTER — Other Ambulatory Visit: Payer: Self-pay

## 2019-10-16 ENCOUNTER — Ambulatory Visit (HOSPITAL_COMMUNITY)
Admission: RE | Admit: 2019-10-16 | Discharge: 2019-10-16 | Disposition: A | Discharge status: Home / Self Care | Payer: Medicare Other | Source: Ambulatory Visit | Attending: Oncology | Admitting: Oncology

## 2019-10-16 ENCOUNTER — Telehealth: Payer: Self-pay | Admitting: *Deleted

## 2019-10-16 DIAGNOSIS — M25559 Pain in unspecified hip: Secondary | ICD-10-CM

## 2019-10-16 DIAGNOSIS — C9000 Multiple myeloma not having achieved remission: Secondary | ICD-10-CM

## 2019-10-16 NOTE — Telephone Encounter (Addendum)
This RN spoke with pt per his call-  Andre [name] states improvement in hemorroids post use of prescribed medications.  He is using miralax and colace.  He states he has a mild sore throat- no fever, cough, redness or white patches.  His right hip is continuing to bother him with noted increased discomfort.  Per phone discussion - Andre [name] will treat sore throat with OTC and home remedies.  Order placed for hip films to evaluate pain.

## 2019-10-16 NOTE — Telephone Encounter (Signed)
See prior entry.

## 2019-10-19 ENCOUNTER — Encounter: Payer: Self-pay | Admitting: Oncology

## 2019-10-20 ENCOUNTER — Other Ambulatory Visit: Payer: Self-pay | Admitting: *Deleted

## 2019-10-20 MED ORDER — HYDROCORTISONE ACETATE 25 MG RE SUPP: 25.0000 mg | Freq: Two times a day (BID) | RECTAL | 3 refills | Status: DC

## 2019-10-26 ENCOUNTER — Ambulatory Visit: Payer: Medicare Other | Attending: Internal Medicine

## 2019-10-26 DIAGNOSIS — Z23 Encounter for immunization: Secondary | ICD-10-CM

## 2019-10-26 NOTE — Progress Notes (Signed)
   Covid-19 Vaccination Clinic  Name:  Andre [name]    MRN: [id number] DOB: 01-23-1951  10/26/2019  [name] was observed post Covid-19 immunization for 15 minutes without incidence. He was provided with Vaccine Information Sheet and instruction to access the V-Safe system.   [name] was instructed to call 911 with any severe reactions post vaccine: [name] Difficulty breathing  . Swelling of your face and throat  . A fast heartbeat  . A bad rash all over your body  . Dizziness and weakness    Immunizations Administered    Name Date Dose VIS Date Route   Pfizer COVID-19 Vaccine 10/26/2019  3:24 PM 0.3 mL 08/29/2019 Intramuscular   Manufacturer: [place]   Lot: [id number]   [place]: [id number]

## 2019-10-27 ENCOUNTER — Other Ambulatory Visit: Payer: Self-pay | Admitting: *Deleted

## 2019-10-31 ENCOUNTER — Other Ambulatory Visit: Payer: Self-pay | Admitting: *Deleted

## 2019-10-31 DIAGNOSIS — C9000 Multiple myeloma not having achieved remission: Secondary | ICD-10-CM

## 2019-11-03 ENCOUNTER — Inpatient Hospital Stay: Payer: Medicare Other | Attending: Oncology

## 2019-11-03 ENCOUNTER — Inpatient Hospital Stay: Payer: Medicare Other

## 2019-11-03 ENCOUNTER — Other Ambulatory Visit: Payer: 59

## 2019-11-03 ENCOUNTER — Ambulatory Visit: Payer: 59

## 2019-11-03 ENCOUNTER — Other Ambulatory Visit: Payer: Self-pay

## 2019-11-03 VITALS — BP 131/74 | HR 86 | Temp 97.9°F | Resp 18

## 2019-11-03 DIAGNOSIS — C9000 Multiple myeloma not having achieved remission: Secondary | ICD-10-CM

## 2019-11-03 DIAGNOSIS — Z9481 Bone marrow transplant status: Secondary | ICD-10-CM

## 2019-11-03 DIAGNOSIS — Z5112 Encounter for antineoplastic immunotherapy: Secondary | ICD-10-CM | POA: Insufficient documentation

## 2019-11-03 DIAGNOSIS — Z79899 Other long term (current) drug therapy: Secondary | ICD-10-CM | POA: Insufficient documentation

## 2019-11-03 LAB — CMP (CANCER CENTER ONLY)
ALT: 21 U/L (ref 0–44)
AST: 24 U/L (ref 15–41)
Albumin: 3.6 g/dL (ref 3.5–5.0)
Alkaline Phosphatase: 53 U/L (ref 38–126)
Anion gap: 7 (ref 5–15)
BUN: 36 mg/dL — ABNORMAL HIGH (ref 8–23)
CO2: 23 mmol/L (ref 22–32)
Calcium: 9.3 mg/dL (ref 8.9–10.3)
Chloride: 105 mmol/L (ref 98–111)
Creatinine: 1.04 mg/dL (ref 0.61–1.24)
GFR, Est AFR Am: >60 mL/min (ref >60)
GFR, Estimated: >60 mL/min (ref >60)
Glucose, Bld: 64 mg/dL — ABNORMAL LOW (ref 70–99)
Potassium: 4.4 mmol/L (ref 3.5–5.1)
Sodium: 135 mmol/L (ref 135–145)
Total Bilirubin: 0.3 mg/dL (ref 0.3–1.2)
Total Protein: 6.7 g/dL (ref 6.5–8.1)

## 2019-11-03 LAB — CBC WITH DIFFERENTIAL (CANCER CENTER ONLY)
Abs Immature Granulocytes: 0.01 10*3/uL (ref 0.00–0.07)
Basophils Absolute: 0 10*3/uL (ref 0.0–0.1)
Basophils Relative: 0 %
Eosinophils Absolute: 0.1 10*3/uL (ref 0.0–0.5)
Eosinophils Relative: 2 %
HCT: 33.3 % — ABNORMAL LOW (ref 39.0–52.0)
Hemoglobin: 10.9 g/dL — ABNORMAL LOW (ref 13.0–17.0)
Immature Granulocytes: 0 %
Lymphocytes Relative: 20 %
Lymphs Abs: 1.1 10*3/uL (ref 0.7–4.0)
MCH: 33.1 pg (ref 26.0–34.0)
MCHC: 32.7 g/dL (ref 30.0–36.0)
MCV: 101.2 fL — ABNORMAL HIGH (ref 80.0–100.0)
Monocytes Absolute: 0.6 10*3/uL (ref 0.1–1.0)
Monocytes Relative: 11 %
Neutro Abs: 3.6 10*3/uL (ref 1.7–7.7)
Neutrophils Relative %: 67 %
Platelet Count: 105 10*3/uL — ABNORMAL LOW (ref 150–400)
RBC: 3.29 MIL/uL — ABNORMAL LOW (ref 4.22–5.81)
RDW: 15 % (ref 11.5–15.5)
WBC Count: 5.3 10*3/uL (ref 4.0–10.5)
nRBC: 0 % (ref 0.0–0.2)

## 2019-11-03 MED ORDER — BORTEZOMIB CHEMO SQ INJECTION 3.5 MG (2.5MG/ML)
1.3000 mg/m2 | Freq: Once | INTRAMUSCULAR | Status: AC
  Administered 2019-11-03: 11:00:00 2.5 mg via SUBCUTANEOUS
  Filled 2019-11-03: qty 1

## 2019-11-03 MED ORDER — PROCHLORPERAZINE MALEATE 10 MG PO TABS: 10.0000 mg | ORAL_TABLET | Freq: Once | ORAL | Status: DC

## 2019-11-03 NOTE — Patient Instructions (Signed)
[facility] Discharge Instructions for Patients Receiving Chemotherapy  Today you received the following chemotherapy agents: bortezomib.  To help prevent nausea and vomiting after your treatment, we encourage you to take your nausea medication as directed.   If you develop nausea and vomiting that is not controlled by your nausea medication, call the clinic.   BELOW ARE SYMPTOMS THAT SHOULD BE REPORTED IMMEDIATELY:  *FEVER GREATER THAN 100.5 F  *CHILLS WITH OR WITHOUT FEVER  NAUSEA AND VOMITING THAT IS NOT CONTROLLED WITH YOUR NAUSEA MEDICATION  *UNUSUAL SHORTNESS OF BREATH  *UNUSUAL BRUISING OR BLEEDING  TENDERNESS IN MOUTH AND THROAT WITH OR WITHOUT PRESENCE OF ULCERS  *URINARY PROBLEMS  *BOWEL PROBLEMS  UNUSUAL RASH Items with * indicate a potential emergency and should be followed up as soon as possible.  Feel free to call the clinic should you have any questions or concerns. The clinic phone number is [phone].  Please show the CHEMO ALERT CARD at check-in to the Emergency Department and triage nurse.   

## 2019-11-04 LAB — PROTEIN ELECTROPHORESIS, SERUM
A/G Ratio: 1.3 (ref 0.7–1.7)
Albumin ELP: 3.5 g/dL (ref 2.9–4.4)
Alpha-1-Globulin: 0.2 g/dL (ref 0.0–0.4)
Alpha-2-Globulin: 0.7 g/dL (ref 0.4–1.0)
Beta Globulin: 0.9 g/dL (ref 0.7–1.3)
Gamma Globulin: 1 g/dL (ref 0.4–1.8)
Globulin, Total: 2.8 g/dL (ref 2.2–3.9)
Total Protein ELP: 6.3 g/dL (ref 6.0–8.5)

## 2019-11-04 LAB — KAPPA/LAMBDA LIGHT CHAINS
Kappa free light chain: 26.5 mg/L — ABNORMAL HIGH (ref 3.3–19.4)
Kappa, lambda light chain ratio: 1.07 (ref 0.26–1.65)
Lambda free light chains: 24.7 mg/L (ref 5.7–26.3)

## 2019-11-10 ENCOUNTER — Ambulatory Visit: Payer: Medicare Other

## 2019-11-16 ENCOUNTER — Encounter: Payer: Self-pay | Admitting: Oncology

## 2019-11-17 ENCOUNTER — Encounter: Payer: Self-pay | Admitting: Oncology

## 2019-11-20 ENCOUNTER — Encounter: Payer: Self-pay | Admitting: Internal Medicine

## 2019-11-20 ENCOUNTER — Other Ambulatory Visit: Payer: Self-pay

## 2019-11-20 ENCOUNTER — Other Ambulatory Visit: Payer: Self-pay | Admitting: *Deleted

## 2019-11-20 ENCOUNTER — Ambulatory Visit (INDEPENDENT_AMBULATORY_CARE_PROVIDER_SITE_OTHER): Payer: Medicare Other | Admitting: Internal Medicine

## 2019-11-20 ENCOUNTER — Ambulatory Visit: Payer: Medicare Other | Attending: Internal Medicine

## 2019-11-20 VITALS — BP 106/72 | HR 85 | Temp 98.4°F | Ht 69.0 in | Wt 177.0 lb

## 2019-11-20 DIAGNOSIS — C9000 Multiple myeloma not having achieved remission: Secondary | ICD-10-CM

## 2019-11-20 DIAGNOSIS — K5903 Drug induced constipation: Secondary | ICD-10-CM

## 2019-11-20 DIAGNOSIS — K5900 Constipation, unspecified: Secondary | ICD-10-CM

## 2019-11-20 DIAGNOSIS — K649 Unspecified hemorrhoids: Secondary | ICD-10-CM | POA: Insufficient documentation

## 2019-11-20 DIAGNOSIS — Z23 Encounter for immunization: Secondary | ICD-10-CM

## 2019-11-20 DIAGNOSIS — K640 First degree hemorrhoids: Secondary | ICD-10-CM | POA: Diagnosis not present

## 2019-11-20 MED ORDER — TRIAMCINOLONE ACETONIDE 0.1 % EX OINT: 1.0000 "application " | TOPICAL_OINTMENT | Freq: Four times a day (QID) | CUTANEOUS | 2 refills | Status: DC

## 2019-11-20 MED ORDER — HYDROCORTISONE ACETATE 30 MG RE SUPP: RECTAL | 3 refills | Status: DC

## 2019-11-20 MED ORDER — VALACYCLOVIR HCL 1 G PO TABS: 1000.0000 mg | ORAL_TABLET | Freq: Every day | ORAL | 3 refills | Status: DC

## 2019-11-20 NOTE — Assessment & Plan Note (Signed)
Colace or Pericolace  Anusol plus supp Preparation H supp

## 2019-11-20 NOTE — Progress Notes (Signed)
   Covid-19 Vaccination Clinic  Name:  Andre [name]    MRN: [id number] DOB: 1951/08/01  11/20/2019  [name] was observed post Covid-19 immunization for 15 minutes without incident. He was provided with Vaccine Information Sheet and instruction to access the V-Safe system.   [name] was instructed to call 911 with any severe reactions post vaccine: [name] Difficulty breathing  . Swelling of face and throat  . A fast heartbeat  . A bad rash all over body  . Dizziness and weakness   Immunizations Administered    Name Date Dose VIS Date Route   Pfizer COVID-19 Vaccine 11/20/2019 10:07 AM 0.3 mL 08/29/2019 Intramuscular   Manufacturer: [place]   Lot: [id number]   [place]: [id number]

## 2019-11-20 NOTE — Patient Instructions (Addendum)
GoodRx card  Colace or Pericolace  Anusol plus supp Preparation H supp

## 2019-11-20 NOTE — Progress Notes (Signed)
Subjective:  Patient ID: Andre [name], male    DOB: Feb 23, 1951  Age: 69 y.o. MRN: [id number]  CC: No chief complaint on file.   HPI [name] presents for hemorrhoids - worse Supp - too expensive    Outpatient Medications Prior to Visit  Medication Sig Dispense Refill  . calcium-vitamin D (OSCAL WITH D) 500-200 MG-UNIT tablet Take 1 tablet by mouth daily.    . cetirizine (ZYRTEC) 10 MG tablet Take 10 mg by mouth daily.    . Cholecalciferol (VITAMIN D3) 2000 units capsule Take 1 capsule (2,000 Units total) by mouth daily. 100 capsule 3  . docusate sodium (COLACE) 100 MG capsule Take 100 mg by mouth daily as needed for mild constipation or moderate constipation.    [name] levothyroxine (SYNTHROID) 150 MCG tablet TAKE 1 TABLET BY MOUTH  EVERY MORNING ON AN EMPTY  STOMACH 90 tablet 3  . ondansetron (ZOFRAN) 8 MG tablet Take 1 tablet (8 mg total) by mouth every 8 (eight) hours as needed for nausea or vomiting. 20 tablet 3  . pantoprazole (PROTONIX) 40 MG tablet TAKE 1 TABLET BY MOUTH  TWICE A DAY BEFORE MEALS 180 tablet 3  . zolendronic acid (ZOMETA) 4 MG/5ML injection Inject 4 mg into the vein every 3 (three) months.    . valACYclovir (VALTREX) 1000 MG tablet TAKE 1 TABLET BY MOUTH  DAILY 90 tablet 3  . escitalopram (LEXAPRO) 10 MG tablet TAKE 1 TABLET BY MOUTH  DAILY 90 tablet 1  . [name] complex vitamins tablet Take 1 tablet by mouth daily. 100 tablet 3  . famotidine (PEPCID) 40 MG tablet Take 1 tablet (40 mg total) by mouth daily. 90 tablet 3  . feeding supplement, ENSURE ENLIVE, (ENSURE ENLIVE) LIQD Take 237 mLs by mouth 2 (two) times daily between meals. 237 mL 12  . hydrocortisone (ANUSOL-HC) 25 MG suppository Place 1 suppository (25 mg total) rectally 2 (two) times daily. 12 suppository 3  . sildenafil (VIAGRA) 25 MG tablet Take 1 tablet (25 mg total) by mouth daily as needed for erectile dysfunction. 10 tablet 0   No facility-administered medications prior to visit.    ROS:  Review of Systems  Constitutional: Positive for fatigue. Negative for appetite change, fever and unexpected weight change.  HENT: Negative for congestion, nosebleeds, sneezing, sore throat and trouble swallowing.   Eyes: Negative for itching and visual disturbance.  Respiratory: Negative for cough.   Cardiovascular: Negative for chest pain, palpitations and leg swelling.  Gastrointestinal: Positive for blood in stool, constipation and rectal pain. Negative for abdominal distention, diarrhea and nausea.  Genitourinary: Negative for frequency and hematuria.  Musculoskeletal: Negative for back pain, gait problem, joint swelling and neck pain.  Skin: Negative for rash.  Neurological: Negative for dizziness, tremors, speech difficulty and weakness.  Psychiatric/Behavioral: Negative for agitation, dysphoric mood and sleep disturbance. The patient is not nervous/anxious.     Objective:  BP 106/72 (BP Location: Left Arm, Patient Position: Sitting, Cuff Size: Normal)   Pulse 85   Temp 98.4 F (36.9 C) (Oral)   Ht 5' 9"  (1.753 m)   Wt 177 lb (80.3 kg)   SpO2 98%   BMI 26.14 kg/m   BP Readings from Last 3 Encounters:  11/20/19 106/72  11/03/19 131/74  10/13/19 121/71    Wt Readings from Last 3 Encounters:  11/20/19 177 lb (80.3 kg)  09/22/19 178 lb 8 oz (81 kg)  09/01/19 178 lb 8 oz (81 kg)    Physical  Exam Constitutional:      General: He is not in acute distress.    Appearance: He is well-developed.     Comments: NAD  Eyes:     Conjunctiva/sclera: Conjunctivae normal.     Pupils: Pupils are equal, round, and reactive to light.  Neck:     Thyroid: No thyromegaly.     Vascular: No JVD.  Cardiovascular:     Rate and Rhythm: Normal rate and regular rhythm.     Heart sounds: Normal heart sounds. No murmur. No friction rub. No gallop.   Pulmonary:     Effort: Pulmonary effort is normal. No respiratory distress.     Breath sounds: Normal breath sounds. No wheezing or rales.   Chest:     Chest wall: No tenderness.  Abdominal:     General: Bowel sounds are normal. There is no distension.     Palpations: Abdomen is soft. There is no mass.     Tenderness: There is no abdominal tenderness. There is no guarding or rebound.  Genitourinary:    Rectum: Normal. Guaiac result negative.  Musculoskeletal:        General: No tenderness. Normal range of motion.     Cervical back: Normal range of motion.  Lymphadenopathy:     Cervical: No cervical adenopathy.  Skin:    General: Skin is warm and dry.     Findings: No rash.  Neurological:     Mental Status: He is alert and oriented to person, place, and time.     Cranial Nerves: No cranial nerve deficit.     Motor: No abnormal muscle tone.     Coordination: Coordination normal.     Gait: Gait normal.     Deep Tendon Reflexes: Reflexes are normal and symmetric.  Psychiatric:        Behavior: Behavior normal.        Thought Content: Thought content normal.        Judgment: Judgment normal.      Procedure: Anoscopy Indication: Risks and benefits were explained to pt in detail. He was placed in lateral decubitus position. Digital rectal exam was normal  . Stool was guaiac negative. . Anoscope was introduced without difficulty. No masses. Upon withdrawal normal mucosa was observed. There was no fissure.  No external hemorrhoids.  Tiny internal hemorrhoids and external hemorrhoids without bleeding Impression: No fissure.  No external hemorrhoids.  Tiny internal and external hemorrhoids without bleeding Tolerated well. Complications - none.  Lab Results  Component Value Date   WBC 5.3 11/03/2019   HGB 10.9 (L) 11/03/2019   HCT 33.3 (L) 11/03/2019   PLT 105 (L) 11/03/2019   GLUCOSE 64 (L) 11/03/2019   CHOL 164 09/06/2017   TRIG 194.0 (H) 09/06/2017   HDL 38.60 (L) 09/06/2017   LDLDIRECT 155.8 11/13/2012   LDLCALC 86 09/06/2017   ALT 21 11/03/2019   AST 24 11/03/2019   NA 135 11/03/2019   K 4.4 11/03/2019   CL  105 11/03/2019   CREATININE 1.04 11/03/2019   BUN 36 (H) 11/03/2019   CO2 23 11/03/2019   TSH 0.05 (L) 09/06/2017   PSA 1.76 09/06/2017   INR 1.07 02/27/2016   HGBA1C 5.4 03/27/2012    DG HIPS BILAT WITH PELVIS 2V  Result Date: 10/16/2019 CLINICAL DATA:  Bilateral hip pain.  History of multiple myeloma. EXAM: DG HIP (WITH OR WITHOUT PELVIS) 2V BILAT COMPARISON:  12/29/2010 FINDINGS: No fracture.  No bone lesion. Mild narrowing of the superolateral hip joint  spaces, right greater than left. Mild superior acetabular subchondral sclerosis. Small marginal osteophytes project from the bases of the femoral heads, right greater than left. SI joints and symphysis pubis are normally spaced and aligned. Soft tissues are unremarkable. IMPRESSION: 1. No fracture or acute finding.  No bone lesion. 2. Degenerative changes the hips, overall mild, right greater than left. Findings are similar to the prior exam. Electronically Signed   By: Lajean Manes M.D.   On: 10/16/2019 12:14    Assessment & Plan:   There are no diagnoses linked to this encounter.   Meds ordered this encounter  Medications  . valACYclovir (VALTREX) 1000 MG tablet    Sig: Take 1 tablet (1,000 mg total) by mouth daily.    Dispense:  90 tablet    Refill:  3    Requesting 1 year supply     Follow-up: No follow-ups on file.  [name], MD

## 2019-11-24 ENCOUNTER — Inpatient Hospital Stay: Payer: Medicare Other | Attending: Oncology

## 2019-11-24 ENCOUNTER — Other Ambulatory Visit: Payer: Self-pay | Admitting: Oncology

## 2019-11-24 ENCOUNTER — Ambulatory Visit: Payer: 59

## 2019-11-24 ENCOUNTER — Other Ambulatory Visit: Payer: Self-pay

## 2019-11-24 ENCOUNTER — Inpatient Hospital Stay: Payer: Medicare Other

## 2019-11-24 ENCOUNTER — Other Ambulatory Visit: Payer: 59

## 2019-11-24 VITALS — BP 109/64 | HR 72 | Temp 97.8°F | Resp 16

## 2019-11-24 DIAGNOSIS — C9000 Multiple myeloma not having achieved remission: Secondary | ICD-10-CM | POA: Insufficient documentation

## 2019-11-24 DIAGNOSIS — Z9481 Bone marrow transplant status: Secondary | ICD-10-CM

## 2019-11-24 DIAGNOSIS — Z5112 Encounter for antineoplastic immunotherapy: Secondary | ICD-10-CM | POA: Diagnosis present

## 2019-11-24 LAB — CMP (CANCER CENTER ONLY)
ALT: 17 U/L (ref 0–44)
AST: 23 U/L (ref 15–41)
Albumin: 3.6 g/dL (ref 3.5–5.0)
Alkaline Phosphatase: 61 U/L (ref 38–126)
Anion gap: 7 (ref 5–15)
BUN: 24 mg/dL — ABNORMAL HIGH (ref 8–23)
CO2: 23 mmol/L (ref 22–32)
Calcium: 9.2 mg/dL (ref 8.9–10.3)
Chloride: 105 mmol/L (ref 98–111)
Creatinine: 1.09 mg/dL (ref 0.61–1.24)
GFR, Est AFR Am: >60 mL/min (ref >60)
GFR, Estimated: >60 mL/min (ref >60)
Glucose, Bld: 83 mg/dL (ref 70–99)
Potassium: 4.4 mmol/L (ref 3.5–5.1)
Sodium: 135 mmol/L (ref 135–145)
Total Bilirubin: 0.4 mg/dL (ref 0.3–1.2)
Total Protein: 6.6 g/dL (ref 6.5–8.1)

## 2019-11-24 LAB — CBC WITH DIFFERENTIAL (CANCER CENTER ONLY)
Abs Immature Granulocytes: 0.02 10*3/uL (ref 0.00–0.07)
Basophils Absolute: 0 10*3/uL (ref 0.0–0.1)
Basophils Relative: 1 %
Eosinophils Absolute: 0.1 10*3/uL (ref 0.0–0.5)
Eosinophils Relative: 2 %
HCT: 33.6 % — ABNORMAL LOW (ref 39.0–52.0)
Hemoglobin: 11.2 g/dL — ABNORMAL LOW (ref 13.0–17.0)
Immature Granulocytes: 0 %
Lymphocytes Relative: 21 %
Lymphs Abs: 1.1 10*3/uL (ref 0.7–4.0)
MCH: 33.1 pg (ref 26.0–34.0)
MCHC: 33.3 g/dL (ref 30.0–36.0)
MCV: 99.4 fL (ref 80.0–100.0)
Monocytes Absolute: 0.9 10*3/uL (ref 0.1–1.0)
Monocytes Relative: 16 %
Neutro Abs: 3.2 10*3/uL (ref 1.7–7.7)
Neutrophils Relative %: 60 %
Platelet Count: 92 10*3/uL — ABNORMAL LOW (ref 150–400)
RBC: 3.38 MIL/uL — ABNORMAL LOW (ref 4.22–5.81)
RDW: 15.7 % — ABNORMAL HIGH (ref 11.5–15.5)
WBC Count: 5.3 10*3/uL (ref 4.0–10.5)
nRBC: 0 % (ref 0.0–0.2)

## 2019-11-24 MED ORDER — BORTEZOMIB CHEMO SQ INJECTION 3.5 MG (2.5MG/ML)
1.3000 mg/m2 | Freq: Once | INTRAMUSCULAR | Status: AC
  Administered 2019-11-24: 2.5 mg via SUBCUTANEOUS
  Filled 2019-11-24: qty 1

## 2019-11-24 MED ORDER — PROCHLORPERAZINE MALEATE 10 MG PO TABS: 10.0000 mg | ORAL_TABLET | Freq: Once | ORAL | Status: DC

## 2019-11-24 NOTE — Patient Instructions (Signed)
[facility] Discharge Instructions for Patients Receiving Chemotherapy  Today you received the following chemotherapy agents: bortezomib.  To help prevent nausea and vomiting after your treatment, we encourage you to take your nausea medication as directed.   If you develop nausea and vomiting that is not controlled by your nausea medication, call the clinic.   BELOW ARE SYMPTOMS THAT SHOULD BE REPORTED IMMEDIATELY:  *FEVER GREATER THAN 100.5 F  *CHILLS WITH OR WITHOUT FEVER  NAUSEA AND VOMITING THAT IS NOT CONTROLLED WITH YOUR NAUSEA MEDICATION  *UNUSUAL SHORTNESS OF BREATH  *UNUSUAL BRUISING OR BLEEDING  TENDERNESS IN MOUTH AND THROAT WITH OR WITHOUT PRESENCE OF ULCERS  *URINARY PROBLEMS  *BOWEL PROBLEMS  UNUSUAL RASH Items with * indicate a potential emergency and should be followed up as soon as possible.  Feel free to call the clinic should you have any questions or concerns. The clinic phone number is [phone].  Please show the CHEMO ALERT CARD at check-in to the Emergency Department and triage nurse.   

## 2019-11-24 NOTE — Progress Notes (Signed)
Per Dr. [name], ok to treat with low platelet count.

## 2019-11-25 ENCOUNTER — Other Ambulatory Visit: Payer: Self-pay | Admitting: *Deleted

## 2019-11-25 MED ORDER — TRIAMCINOLONE ACETONIDE 0.1 % EX OINT: 1.0000 "application " | TOPICAL_OINTMENT | Freq: Four times a day (QID) | CUTANEOUS | 2 refills | Status: DC

## 2019-12-09 NOTE — Progress Notes (Signed)
Pharmacist Chemotherapy Monitoring - Follow Up Assessment    I verify that I have reviewed each item in the below checklist:  . Regimen for the patient is scheduled for the appropriate day and plan matches scheduled date. [name] Appropriate non-routine labs are ordered dependent on drug ordered. . If applicable, additional medications reviewed and ordered per protocol based on lifetime cumulative doses and/or treatment regimen.   Plan for follow-up and/or issues identified: No . I-vent associated with next due treatment: No . MD and/or nursing notified: No  Ayyub, Dillie 12/09/2019 1:05 PM

## 2019-12-14 ENCOUNTER — Encounter: Payer: Self-pay | Admitting: Oncology

## 2019-12-15 ENCOUNTER — Telehealth: Payer: Self-pay | Admitting: *Deleted

## 2019-12-15 ENCOUNTER — Inpatient Hospital Stay: Payer: Medicare Other

## 2019-12-15 ENCOUNTER — Other Ambulatory Visit: Payer: 59

## 2019-12-15 ENCOUNTER — Ambulatory Visit: Payer: 59

## 2019-12-15 ENCOUNTER — Other Ambulatory Visit: Payer: Self-pay

## 2019-12-15 VITALS — BP 123/71 | HR 72 | Temp 97.9°F | Resp 16

## 2019-12-15 DIAGNOSIS — C9000 Multiple myeloma not having achieved remission: Secondary | ICD-10-CM

## 2019-12-15 DIAGNOSIS — Z5112 Encounter for antineoplastic immunotherapy: Secondary | ICD-10-CM | POA: Diagnosis not present

## 2019-12-15 DIAGNOSIS — Z9481 Bone marrow transplant status: Secondary | ICD-10-CM

## 2019-12-15 LAB — CBC WITH DIFFERENTIAL (CANCER CENTER ONLY)
Abs Immature Granulocytes: 0.01 10*3/uL (ref 0.00–0.07)
Basophils Absolute: 0 10*3/uL (ref 0.0–0.1)
Basophils Relative: 0 %
Eosinophils Absolute: 0.1 10*3/uL (ref 0.0–0.5)
Eosinophils Relative: 2 %
HCT: 34.6 % — ABNORMAL LOW (ref 39.0–52.0)
Hemoglobin: 11.4 g/dL — ABNORMAL LOW (ref 13.0–17.0)
Immature Granulocytes: 0 %
Lymphocytes Relative: 19 %
Lymphs Abs: 1 10*3/uL (ref 0.7–4.0)
MCH: 33.5 pg (ref 26.0–34.0)
MCHC: 32.9 g/dL (ref 30.0–36.0)
MCV: 101.8 fL — ABNORMAL HIGH (ref 80.0–100.0)
Monocytes Absolute: 0.8 10*3/uL (ref 0.1–1.0)
Monocytes Relative: 15 %
Neutro Abs: 3.4 10*3/uL (ref 1.7–7.7)
Neutrophils Relative %: 64 %
Platelet Count: 92 10*3/uL — ABNORMAL LOW (ref 150–400)
RBC: 3.4 MIL/uL — ABNORMAL LOW (ref 4.22–5.81)
RDW: 16.5 % — ABNORMAL HIGH (ref 11.5–15.5)
WBC Count: 5.4 10*3/uL (ref 4.0–10.5)
nRBC: 0 % (ref 0.0–0.2)

## 2019-12-15 LAB — CMP (CANCER CENTER ONLY)
ALT: 16 U/L (ref 0–44)
AST: 25 U/L (ref 15–41)
Albumin: 3.7 g/dL (ref 3.5–5.0)
Alkaline Phosphatase: 60 U/L (ref 38–126)
Anion gap: 11 (ref 5–15)
BUN: 25 mg/dL — ABNORMAL HIGH (ref 8–23)
CO2: 22 mmol/L (ref 22–32)
Calcium: 9.1 mg/dL (ref 8.9–10.3)
Chloride: 105 mmol/L (ref 98–111)
Creatinine: 1.18 mg/dL (ref 0.61–1.24)
GFR, Est AFR Am: >60 mL/min (ref >60)
GFR, Estimated: >60 mL/min (ref >60)
Glucose, Bld: 75 mg/dL (ref 70–99)
Potassium: 4.6 mmol/L (ref 3.5–5.1)
Sodium: 138 mmol/L (ref 135–145)
Total Bilirubin: 0.4 mg/dL (ref 0.3–1.2)
Total Protein: 6.9 g/dL (ref 6.5–8.1)

## 2019-12-15 MED ORDER — BORTEZOMIB CHEMO SQ INJECTION 3.5 MG (2.5MG/ML)
1.3000 mg/m2 | Freq: Once | INTRAMUSCULAR | Status: AC
  Administered 2019-12-15: 2.5 mg via SUBCUTANEOUS
  Filled 2019-12-15: qty 1

## 2019-12-15 MED ORDER — PROCHLORPERAZINE MALEATE 10 MG PO TABS
ORAL_TABLET | ORAL | Status: AC
  Filled 2019-12-15: qty 1

## 2019-12-15 MED ORDER — PROCHLORPERAZINE MALEATE 10 MG PO TABS: 10.0000 mg | ORAL_TABLET | Freq: Once | ORAL | Status: DC

## 2019-12-15 MED ORDER — SODIUM CHLORIDE 0.9 % IV SOLN
INTRAVENOUS | Status: DC
  Filled 2019-12-15: qty 250

## 2019-12-15 MED ORDER — ZOLEDRONIC ACID 4 MG/100ML IV SOLN
4.0000 mg | Freq: Once | INTRAVENOUS | Status: AC
  Administered 2019-12-15: 4 mg via INTRAVENOUS

## 2019-12-15 MED ORDER — LEVOTHYROXINE SODIUM 150 MCG PO TABS: ORAL_TABLET | ORAL | 3 refills | Status: DC

## 2019-12-15 MED ORDER — ZOLEDRONIC ACID 4 MG/100ML IV SOLN
INTRAVENOUS | Status: AC
  Filled 2019-12-15: qty 100

## 2019-12-15 NOTE — Telephone Encounter (Signed)
Per MD ok to treat with velcade and zometa today with platelet count of 92,000.

## 2019-12-15 NOTE — Patient Instructions (Signed)
[facility] Discharge Instructions for Patients Receiving Chemotherapy  Today you received the following chemotherapy agents Velcade  To help prevent nausea and vomiting after your treatment, we encourage you to take your nausea medication as directed.    If you develop nausea and vomiting that is not controlled by your nausea medication, call the clinic.   BELOW ARE SYMPTOMS THAT SHOULD BE REPORTED IMMEDIATELY:  *FEVER GREATER THAN 100.5 F  *CHILLS WITH OR WITHOUT FEVER  NAUSEA AND VOMITING THAT IS NOT CONTROLLED WITH YOUR NAUSEA MEDICATION  *UNUSUAL SHORTNESS OF BREATH  *UNUSUAL BRUISING OR BLEEDING  TENDERNESS IN MOUTH AND THROAT WITH OR WITHOUT PRESENCE OF ULCERS  *URINARY PROBLEMS  *BOWEL PROBLEMS  UNUSUAL RASH Items with * indicate a potential emergency and should be followed up as soon as possible.  Feel free to call the clinic should you have any questions or concerns. The clinic phone number is [phone].  Please show the CHEMO ALERT CARD at check-in to the Emergency Department and triage nurse.   Zoledronic Acid injection (Hypercalcemia, Oncology) What is this medicine? ZOLEDRONIC ACID (ZOE le dron ik AS id) lowers the amount of calcium loss from bone. It is used to treat too much calcium in your blood from cancer. It is also used to prevent complications of cancer that has spread to the bone. This medicine may be used for other purposes; ask your health care provider or pharmacist if you have questions. COMMON BRAND NAME(S): Zometa What should I tell my health care provider before I take this medicine? They need to know if you have any of these conditions:  aspirin-sensitive asthma  cancer, especially if you are receiving medicines used to treat cancer  dental disease or wear dentures  infection  kidney disease  receiving corticosteroids like dexamethasone or prednisone  an unusual or allergic reaction to zoledronic acid, other  medicines, foods, dyes, or preservatives  pregnant or trying to get pregnant  breast-feeding How should I use this medicine? This medicine is for infusion into a vein. It is given by a health care professional in a hospital or clinic setting. Talk to your pediatrician regarding the use of this medicine in children. Special care may be needed. Overdosage: If you think you have taken too much of this medicine contact a poison control center or emergency room at once. NOTE: This medicine is only for you. Do not share this medicine with others. What if I miss a dose? It is important not to miss your dose. Call your doctor or health care professional if you are unable to keep an appointment. What may interact with this medicine?  certain antibiotics given by injection  NSAIDs, medicines for pain and inflammation, like ibuprofen or naproxen  some diuretics like bumetanide, furosemide  teriparatide  thalidomide This list may not describe all possible interactions. Give your health care provider a list of all the medicines, herbs, non-prescription drugs, or dietary supplements you use. Also tell them if you smoke, drink alcohol, or use illegal drugs. Some items may interact with your medicine. What should I watch for while using this medicine? Visit your doctor or health care professional for regular checkups. It may be some time before you see the benefit from this medicine. Do not stop taking your medicine unless your doctor tells you to. Your doctor may order blood tests or other tests to see how you are doing. Women should inform their doctor if they wish to become pregnant or think they might   be pregnant. There is a potential for serious side effects to an unborn child. Talk to your health care professional or pharmacist for more information. You should make sure that you get enough calcium and vitamin D while you are taking this medicine. Discuss the foods you eat and the vitamins you take  with your health care professional. Some people who take this medicine have severe bone, joint, and/or muscle pain. This medicine may also increase your risk for jaw problems or a broken thigh bone. Tell your doctor right away if you have severe pain in your jaw, bones, joints, or muscles. Tell your doctor if you have any pain that does not go away or that gets worse. Tell your dentist and dental surgeon that you are taking this medicine. You should not have major dental surgery while on this medicine. See your dentist to have a dental exam and fix any dental problems before starting this medicine. Take good care of your teeth while on this medicine. Make sure you see your dentist for regular follow-up appointments. What side effects may I notice from receiving this medicine? Side effects that you should report to your doctor or health care professional as soon as possible:  allergic reactions like skin rash, itching or hives, swelling of the face, lips, or tongue  anxiety, confusion, or depression  breathing problems  changes in vision  eye pain  feeling faint or lightheaded, falls  jaw pain, especially after dental work  mouth sores  muscle cramps, stiffness, or weakness  redness, blistering, peeling or loosening of the skin, including inside the mouth  trouble passing urine or change in the amount of urine Side effects that usually do not require medical attention (report to your doctor or health care professional if they continue or are bothersome):  bone, joint, or muscle pain  constipation  diarrhea  fever  hair loss  irritation at site where injected  loss of appetite  nausea, vomiting  stomach upset  trouble sleeping  trouble swallowing  weak or tired This list may not describe all possible side effects. Call your doctor for medical advice about side effects. You may report side effects to FDA at 1-800-FDA-1088. Where should I keep my medicine? This drug  is given in a hospital or clinic and will not be stored at home. NOTE: This sheet is a summary. It may not cover all possible information. If you have questions about this medicine, talk to your doctor, pharmacist, or health care provider.  2020 Elsevier/Gold Standard (2014-01-31 14:19:39)  

## 2019-12-16 ENCOUNTER — Other Ambulatory Visit: Payer: Self-pay | Admitting: *Deleted

## 2019-12-16 ENCOUNTER — Encounter: Payer: Self-pay | Admitting: Oncology

## 2019-12-16 LAB — KAPPA/LAMBDA LIGHT CHAINS
Kappa free light chain: 27.5 mg/L — ABNORMAL HIGH (ref 3.3–19.4)
Kappa, lambda light chain ratio: 1.26 (ref 0.26–1.65)
Lambda free light chains: 21.9 mg/L (ref 5.7–26.3)

## 2019-12-16 MED ORDER — TRIAMCINOLONE ACETONIDE 0.5 % EX OINT: 1.0000 "application " | TOPICAL_OINTMENT | Freq: Two times a day (BID) | CUTANEOUS | 3 refills | Status: DC

## 2019-12-17 LAB — PROTEIN ELECTROPHORESIS, SERUM
A/G Ratio: 1.3 (ref 0.7–1.7)
Albumin ELP: 3.6 g/dL (ref 2.9–4.4)
Alpha-1-Globulin: 0.2 g/dL (ref 0.0–0.4)
Alpha-2-Globulin: 0.7 g/dL (ref 0.4–1.0)
Beta Globulin: 1 g/dL (ref 0.7–1.3)
Gamma Globulin: 1 g/dL (ref 0.4–1.8)
Globulin, Total: 2.8 g/dL (ref 2.2–3.9)
Total Protein ELP: 6.4 g/dL (ref 6.0–8.5)

## 2019-12-30 NOTE — Progress Notes (Signed)
Pharmacist Chemotherapy Monitoring - Follow Up Assessment    I verify that I have reviewed each item in the below checklist:  . Regimen for the patient is scheduled for the appropriate day and plan matches scheduled date. [name] Appropriate non-routine labs are ordered dependent on drug ordered. . If applicable, additional medications reviewed and ordered per protocol based on lifetime cumulative doses and/or treatment regimen.   Plan for follow-up and/or issues identified: No . I-vent associated with next due treatment: No  Andre [name] D 12/30/2019 2:26 PM

## 2020-01-04 NOTE — Progress Notes (Signed)
[place]  [phone] [phone]    ID: Andre [name]  DOB: 1951-07-15  MR#: [id number] CSN#: [id number]   Patient Care Team: Andre Anger, MD as PCP - General (Internal Medicine) Andre [name], Andre Dad, MD as Consulting Physician (Oncology) Andre Living, MD as Consulting Physician (Neurology) Andre Lewandowsky, MD as Consulting Physician (Internal Medicine) Andre Ducking, MD as Consulting Physician (Neurology) Andre [name], DDS as Referring Physician (Oral Surgery) OTHERS: Andre [name] DDS   CHIEF COMPLAINT: Multiple myeloma  CURRENT TREATMENT: bortezomib every 21 days, zoledronate every 12 weeks   INTERVAL HISTORY:  Andre [name] returns today for follow-up and treatment of his multiple myeloma.  Clinically he is very stable and his functional status continues to improve  He has been on bortezomib every 21 days since July 2020.  Constipation is better.  He has no peripheral neuropathy symptoms.  He also continues on zolendronate, which is given every 12 weeks.  His most recent dose was 12/15/2019.  He tolerates that with no side effects that he is aware of.  We are following his M spike and his kappa lambda ratio.  Current results show  Lab Results  Component Value Date   MSPIKE Not Observed 12/15/2019   MSPIKE Not Observed 11/03/2019   MSPIKE Not Observed 10/13/2019   MSPIKE Not Observed 09/22/2019   MSPIKE Not Observed 09/01/2019   His kappa lambda ratio is also in the normal range.  Since his last visit, he underwent bilateral hip x-ray, which showed no fracture or acute finding.   REVIEW OF SYSTEMS: Andre [name] is finally fully retired.  He exercises 6 days a week at least an hour a day.  He is doing the transfer recovery.  He also does the gym at least 3 times a week.  He and his wife have both received the [place] vaccine, both doses, and did well with that.  He has had no fever no rash, good urine stream, some  nocturia as a only other concern.  A detailed review of systems was otherwise benign today   HISTORY OF PRESENT ILLNESS:  From the original intake nodes:  Andre [name] was worked up for peptic ulcer disease in August of 2011, with significant bleeding and anemia.  The patient was Andre [name].  He received some epinephrine when he had his EGD and then started on Protonix.  [name] anemia slowly resolved so that by September, his hemoglobin was up to 10.9 and by earlier this month, his hemoglobin was up to 12.5.    As part of his general workup, he was found to have a slightly elevated globulin fraction.  In September, his total protein was 8.3 with an albumin of 3.8.  In January, the total protein was 8.4 and albumin 3.6.  With persistence of this slight abnormality, Dr. Olevia Perches obtained serum immunofixation and SPEP.  The SPTP showed an M-spike of 2.67 grams.  A total IgG was 4,190.  Total IgA low at 28.  Total IgM low at 28 also.  The immunofixation showed a monoclonal IgG lambda paraprotein.  There were also monoclonal free lambda light chains present. With this information, the patient was referred for further evaluation.   A diagnosis of myeloma was confirmed by bone marrow biopsy and subsequebnt treatment is as detailed below   PAST MEDICAL HISTORY: Past Medical History:  Diagnosis Date  . Allergy   . Anemia   . Arthritis   . Asthma    no  treatment x 20 years  . Depression   . Double vision    occurs at times   . Duodenal ulcer   . GERD (gastroesophageal reflux disease)   . Hyperlipidemia   . Hypertension   . Hypothyroidism   . Multiple myeloma 07/04/2011  . Thyroid disease    Significant for peptic ulcer disease as noted above.  History of hyperlipidemia.  History of anxiety and depression.  History of GERD which is significantly improved with weight loss and history of reactive airway disease, possibly secondary to the GERD which also improved with  weight loss.  There was a history of obesity, now much improved secondary to exercise and diet.    PAST SURGICAL HISTORY: Past Surgical History:  Procedure Laterality Date  . BONE MARROW TRANSPLANT  2011   for MM  . CARDIOLITE STUDY  11/25/2003   NORMAL  . TONSILLECTOMY      FAMILY HISTORY: Family History  Problem Relation Age of Onset  . Throat cancer Mother   . Hypertension Father   . Stroke Father   . Asthma Father   . Diabetes Father   . Pancreatic cancer Brother    [name] father died at the age of 87 following a stroke.  [name] mother died with cancer of the throat at the age of 11.   She had a history of depression and was a smoker.  Not clear how much alcohol she drank, according to the patient.  The patient had two brothers.  One died from the age of 16 from a "kidney problem."  The second one died at the age of 91 from pancreatic cancer. (This may have been duodenal cancer. 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  His wife of 37+ years was [name], but they divorced. In 2016 he remarried, his current wife's name is [name].  The patient has two daughters:  [name], who works in [place], who had her second child April 2016. Both daughters live here in [place].  [place] first daughter, [name], born 03/06/2013, apparently has Weber/Osler/Rendu. The patient is not a church attender.  Andre [name] also participates in [place] even though actually there is no alcohol or drug problem in the family.    ADVANCED DIRECTIVES: In place   HEALTH MAINTENANCE:  Social History   Tobacco Use  . Smoking status: Former Smoker    Years: 3.00    Quit date: 03/27/1969    Years since quitting: 50.8  . Smokeless tobacco: Never Used  Substance Use Topics  . Alcohol use: No  . Drug use:  No    Colonoscopy: January 2015  PSA: December 2018, normal/Dr. [name]  Lipid panel: per Dr. Alain Marion  Allergies  Allergen Reactions  . Atorvastatin Other (See Comments)  . Rosuvastatin     Other reaction(s): Liver Disorder  . Crestor [Rosuvastatin Calcium]     ADVERSE EFFECTS ON LIVER  . Lipitor [Atorvastatin Calcium]   . Septra [Sulfamethoxazole-Trimethoprim] Rash   Current Outpatient Medications  Medication Sig Dispense Refill  . calcium-vitamin D (OSCAL WITH D) 500-200 MG-UNIT tablet Take 1 tablet by mouth daily.    . cetirizine (ZYRTEC) 10 MG tablet Take 10 mg by mouth daily.    . Cholecalciferol (VITAMIN D3) 2000 units capsule Take 1 capsule (2,000 Units total) by  mouth daily. 100 capsule 3  . docusate sodium (COLACE) 100 MG capsule Take 100 mg by mouth daily as needed for mild constipation or moderate constipation.    [name] escitalopram (LEXAPRO) 10 MG tablet TAKE 1 TABLET BY MOUTH  DAILY 90 tablet 1  . HYDROCORTISONE ACE, RECTAL, 30 MG SUPP 1 pr bid x 7 d prn 14 suppository 3  . levothyroxine (SYNTHROID) 150 MCG tablet TAKE 1 TABLET BY MOUTH  EVERY MORNING ON AN EMPTY  STOMACH 90 tablet 3  . ondansetron (ZOFRAN) 8 MG tablet Take 1 tablet (8 mg total) by mouth every 8 (eight) hours as needed for nausea or vomiting. 20 tablet 3  . pantoprazole (PROTONIX) 40 MG tablet TAKE 1 TABLET BY MOUTH  TWICE A DAY BEFORE MEALS 180 tablet 3  . triamcinolone ointment (KENALOG) 0.5 % Apply 1 application topically 2 (two) times daily. 30 g 3  . valACYclovir (VALTREX) 1000 MG tablet Take 1 tablet (1,000 mg total) by mouth daily. 90 tablet 3  . zolendronic acid (ZOMETA) 4 MG/5ML injection Inject 4 mg into the vein every 3 (three) months.     No current facility-administered medications for this visit.   Objective: white man in no acute distress  Vitals:   01/05/20 1109  BP: 123/73  Pulse: 81  Resp: 17  Temp: 98.2 F (36.8 C)  SpO2: 99%  Body mass index is 26.61 kg/m. Filed Weights    01/05/20 1109  Weight: 180 lb 3.2 oz (81.7 kg)    Sclerae unicteric, EOMs intact, pupils round and equal Wearing a mask No cervical or supraclavicular adenopathy Lungs no rales or rhonchi Heart regular rate and rhythm Abd soft, nontender, positive bowel sounds MSK no focal spinal tenderness, no upper extremity lymphedema Neuro: nonfocal, well oriented, appropriate affect  LAB RESULTS:  Appointment on 01/05/2020  Component Date Value Ref Range Status  . WBC Count 01/05/2020 5.8  4.0 - 10.5 K/uL Final  . RBC 01/05/2020 3.48* 4.22 - 5.81 MIL/uL Final  . Hemoglobin 01/05/2020 11.7* 13.0 - 17.0 g/dL Final  . HCT 01/05/2020 35.3* 39.0 - 52.0 % Final  . MCV 01/05/2020 101.4* 80.0 - 100.0 fL Final  . MCH 01/05/2020 33.6  26.0 - 34.0 pg Final  . MCHC 01/05/2020 33.1  30.0 - 36.0 g/dL Final  . RDW 01/05/2020 16.4* 11.5 - 15.5 % Final  . Platelet Count 01/05/2020 99* 150 - 400 K/uL Final  . nRBC 01/05/2020 0.0  0.0 - 0.2 % Final  . Neutrophils Relative % 01/05/2020 64  % Final  . Neutro Abs 01/05/2020 3.7  1.7 - 7.7 K/uL Final  . Lymphocytes Relative 01/05/2020 20  % Final  . Lymphs Abs 01/05/2020 1.2  0.7 - 4.0 K/uL Final  . Monocytes Relative 01/05/2020 14  % Final  . Monocytes Absolute 01/05/2020 0.8  0.1 - 1.0 K/uL Final  . Eosinophils Relative 01/05/2020 1  % Final  . Eosinophils Absolute 01/05/2020 0.1  0.0 - 0.5 K/uL Final  . Basophils Relative 01/05/2020 1  % Final  . Basophils Absolute 01/05/2020 0.0  0.0 - 0.1 K/uL Final  . Immature Granulocytes 01/05/2020 0  % Final  . Abs Immature Granulocytes 01/05/2020 0.01  0.00 - 0.07 K/uL Final   Performed at [facility], [place] [address], [place], [place] [address]    STUDIES: No results found.    ASSESSMENT: 69 y.o. [place] man with a history of multiple myeloma diagnosed February of 2012, with an initial M-spike  of 2.6 g/dL, IFE showing an IgG lambda paraprotein and free lambda light chains in the  urine. Cytogenetics showed trisomy 65. Bone marrow biopsy showed a 22% plasmacytosis. Treated with   (1) bortezomib (subcutaneously), lenalidomide, and dexamethasone, with repeat bone marrow biopsy May of 2012 showing 10% plasmacytosis   (2) high-dose chemotherapy with BCNU and melphalan at [facility], followed by stem cell rescue July of 2012   (3) on zoledronic acid started December of 2012, initially monthly, currently given every 3 months, most recent dose  12/07/2015  (4) low-dose lenalidomide resumed April 2013, interrupted several times.  Resumed again on 02/19/2013, ata dose of 5 mg daily, 21 days on and 7 days off, later further reduced to 7 days on, 7 days off  (5) CNS symptoms and abnormal brain MRI extensively evaluated by neurology with no definitive diagnosis established  (6) rising M spike noted June 2014 but did not meet criteria for carfilzomib study  (7) on lenalidomide 25 mg daily, 14 days on, 7 days off, starting 04/18/2013, interrupted December 2014 because of rash;   (a) resumed January 2015 at 10 mg/ day at 21 days on/ 7 days off  (Demari) starting 08/18/2014 decreased to 10 mg/ day 14 days on, 7 days off because of cytopenias  (c) as of February 2016 was on 5 mg daily 7 days on 7 days off  (d) lenalidomide discontinued December 2016 with evidence of disease progression  (8) transient global amnesia 05/29/2015, resolved without intervention  (9) starting PVD 10/25/2015 w ASA 325 thromboprophylaxis, valacyclovir prophylaxis, last dose 12/17/2015  (a) pomalidomide 4 mg/d days 1-14  (Eagan) bortezomib sQ days 2,5,9,12 of each 21 day cycle  (c) dexamethasome 20 mg two days a week  (d) dexamethasone bortezomib and pomalidomide discontinued late December 2018 with poor tolerance  (10) metapneumovirus pneumonia April 2017  (a) completing course of steroids and week of bactrim mid April 2017  (11) status post second autologous transplant at [facility] 02/04/2016(preparatory regimen melphalan  200 mg/m)  (a) received twelve-month vaccinations 03/14/2017 (DPT, Haemophilus, Pneumovax 13, polio)  (Trisha) 14 months injections 05/04/2016 include DTaP, Hib conjugate, HepB energex Raiden 20 mcg/ml, Prevnar 13  (c) 24 month vaccines due at [facility] June 2019  (12) maintenance therapy started November 2017, consisting of  (a) bortezomib 1.3 mg/M2 every 14 days, first dose 07/27/2016  (Edgardo) pomalidomide 1 mg days 1-21 Q28 days, started 07/19/2016  (c) zolendronate monthly started 07/27/2016 (previously Q 3 months) however patient unable to tolerate, and changed back to q3 months in April, 2018   (d) Bortezomib changed to monthly as of June 2018 because of tolerance issues, however discontinued after 09/14/2017 dose because of a rise in his M spike  (e) pomalidomide held after 10/18/2017 in preparation for possible study at [facility] (venetoclax)--never resumed  (f) with numbers actually improved off treatment, resumed every 2-week bortezomib 12/25/2017  (g) changed to every 3-week bortezomib as of 03/17/2019   PLAN:  Andre [name] is now 9 years out from initial diagnosis of myeloma and 4 years out from his second autologous transplant.  At present he is in remission, with no detectable M spike and a normal kappa lambda ratio.  Furthermore he is tolerating his bortezomib remarkably well.  He has no peripheral neuropathy.  He gets a little fatigue on days 2 through 4 of each 21-day treatment but this is really minimal and he continues to go to the gym and exercise even on those days.  Accordingly we are continuing treatment as  before.  He will return every 3 weeks for Velcade and in June he will also receive zoledronate.  He is already scheduled to meet with Dr. Alvie Heidelberg 03/10/2020 and he plans to ask her whether he could further lengthen the bortezomib treatments to monthly or perhaps omit them altogether  He has received the [place] vaccine.  He understands because of his immunologic cancer he is not likely to make a  significant response and he should consider himself on vaccinated and continue to take appropriate precautions  I am delighted that he is participating in the trails to recovery program  He will return to see me in July after his meeting at [facility].  He knows to call for any other issue that may develop before then  Total encounter time 30 minutes.*  Tyress Loden, Andre Dad, MD  01/05/20 11:25 AM Medical Oncology and Hematology [facility] [place], [place] [address] Tel. [phone]    Fax. [phone]    I, Wilburn Mylar, am acting as scribe for Dr. Virgie [name]. Amberlea Spagnuolo.  I, Lurline Del MD, have reviewed the above documentation for accuracy and completeness, and I agree with the above.   *Total Encounter Time as defined by the Centers for Medicare and Medicaid Services includes, in addition to the face-to-face time of a patient visit (documented in the note above) non-face-to-face time: obtaining and reviewing outside history, ordering and reviewing medications, tests or procedures, care coordination (communications with other health care professionals or caregivers) and documentation in the medical record.

## 2020-01-05 ENCOUNTER — Inpatient Hospital Stay: Payer: Medicare Other

## 2020-01-05 ENCOUNTER — Inpatient Hospital Stay (HOSPITAL_BASED_OUTPATIENT_CLINIC_OR_DEPARTMENT_OTHER): Payer: Medicare Other | Admitting: Oncology

## 2020-01-05 ENCOUNTER — Other Ambulatory Visit: Payer: Self-pay | Admitting: *Deleted

## 2020-01-05 ENCOUNTER — Inpatient Hospital Stay: Payer: Medicare Other | Attending: Oncology

## 2020-01-05 ENCOUNTER — Other Ambulatory Visit: Payer: Self-pay

## 2020-01-05 VITALS — BP 123/73 | HR 81 | Temp 98.2°F | Resp 17 | Ht 69.0 in | Wt 180.2 lb

## 2020-01-05 DIAGNOSIS — G620 Drug-induced polyneuropathy: Secondary | ICD-10-CM | POA: Diagnosis not present

## 2020-01-05 DIAGNOSIS — Z5112 Encounter for antineoplastic immunotherapy: Secondary | ICD-10-CM | POA: Insufficient documentation

## 2020-01-05 DIAGNOSIS — C9000 Multiple myeloma not having achieved remission: Secondary | ICD-10-CM

## 2020-01-05 DIAGNOSIS — R9089 Other abnormal findings on diagnostic imaging of central nervous system: Secondary | ICD-10-CM | POA: Diagnosis not present

## 2020-01-05 DIAGNOSIS — Z9481 Bone marrow transplant status: Secondary | ICD-10-CM

## 2020-01-05 DIAGNOSIS — T451X5A Adverse effect of antineoplastic and immunosuppressive drugs, initial encounter: Secondary | ICD-10-CM

## 2020-01-05 LAB — CBC WITH DIFFERENTIAL (CANCER CENTER ONLY)
Abs Immature Granulocytes: 0.01 10*3/uL (ref 0.00–0.07)
Basophils Absolute: 0 10*3/uL (ref 0.0–0.1)
Basophils Relative: 1 %
Eosinophils Absolute: 0.1 10*3/uL (ref 0.0–0.5)
Eosinophils Relative: 1 %
HCT: 35.3 % — ABNORMAL LOW (ref 39.0–52.0)
Hemoglobin: 11.7 g/dL — ABNORMAL LOW (ref 13.0–17.0)
Immature Granulocytes: 0 %
Lymphocytes Relative: 20 %
Lymphs Abs: 1.2 10*3/uL (ref 0.7–4.0)
MCH: 33.6 pg (ref 26.0–34.0)
MCHC: 33.1 g/dL (ref 30.0–36.0)
MCV: 101.4 fL — ABNORMAL HIGH (ref 80.0–100.0)
Monocytes Absolute: 0.8 10*3/uL (ref 0.1–1.0)
Monocytes Relative: 14 %
Neutro Abs: 3.7 10*3/uL (ref 1.7–7.7)
Neutrophils Relative %: 64 %
Platelet Count: 99 10*3/uL — ABNORMAL LOW (ref 150–400)
RBC: 3.48 MIL/uL — ABNORMAL LOW (ref 4.22–5.81)
RDW: 16.4 % — ABNORMAL HIGH (ref 11.5–15.5)
WBC Count: 5.8 10*3/uL (ref 4.0–10.5)
nRBC: 0 % (ref 0.0–0.2)

## 2020-01-05 LAB — CMP (CANCER CENTER ONLY)
ALT: 17 U/L (ref 0–44)
AST: 24 U/L (ref 15–41)
Albumin: 3.8 g/dL (ref 3.5–5.0)
Alkaline Phosphatase: 57 U/L (ref 38–126)
Anion gap: 8 (ref 5–15)
BUN: 34 mg/dL — ABNORMAL HIGH (ref 8–23)
CO2: 21 mmol/L — ABNORMAL LOW (ref 22–32)
Calcium: 9.4 mg/dL (ref 8.9–10.3)
Chloride: 103 mmol/L (ref 98–111)
Creatinine: 1.15 mg/dL (ref 0.61–1.24)
GFR, Est AFR Am: >60 mL/min (ref >60)
GFR, Estimated: >60 mL/min (ref >60)
Glucose, Bld: 91 mg/dL (ref 70–99)
Potassium: 4.9 mmol/L (ref 3.5–5.1)
Sodium: 132 mmol/L — ABNORMAL LOW (ref 135–145)
Total Bilirubin: 0.4 mg/dL (ref 0.3–1.2)
Total Protein: 7 g/dL (ref 6.5–8.1)

## 2020-01-05 MED ORDER — PROCHLORPERAZINE MALEATE 10 MG PO TABS: 10.0000 mg | ORAL_TABLET | Freq: Once | ORAL | Status: DC

## 2020-01-05 MED ORDER — BORTEZOMIB CHEMO SQ INJECTION 3.5 MG (2.5MG/ML)
1.3000 mg/m2 | Freq: Once | INTRAMUSCULAR | Status: AC
  Administered 2020-01-05: 2.5 mg via SUBCUTANEOUS
  Filled 2020-01-05: qty 1

## 2020-01-05 NOTE — Progress Notes (Signed)
Okay to treat with plt 99 per Dr. [name]

## 2020-01-05 NOTE — Patient Instructions (Signed)
[facility] Discharge Instructions for Patients Receiving Chemotherapy  Today you received the following chemotherapy agents Velcade.  To help prevent nausea and vomiting after your treatment, we encourage you to take your nausea medication as directed.  If you develop nausea and vomiting that is not controlled by your nausea medication, call the clinic.   BELOW ARE SYMPTOMS THAT SHOULD BE REPORTED IMMEDIATELY:  *FEVER GREATER THAN 100.5 F  *CHILLS WITH OR WITHOUT FEVER  NAUSEA AND VOMITING THAT IS NOT CONTROLLED WITH YOUR NAUSEA MEDICATION  *UNUSUAL SHORTNESS OF BREATH  *UNUSUAL BRUISING OR BLEEDING  TENDERNESS IN MOUTH AND THROAT WITH OR WITHOUT PRESENCE OF ULCERS  *URINARY PROBLEMS  *BOWEL PROBLEMS  UNUSUAL RASH Items with * indicate a potential emergency and should be followed up as soon as possible.  Feel free to call the clinic should you have any questions or concerns. The clinic phone number is [phone].  Please show the CHEMO ALERT CARD at check-in to the Emergency Department and triage nurse.   

## 2020-01-05 NOTE — Progress Notes (Signed)
Per Dr. [name] - Ok to treat with platelet count.

## 2020-01-06 ENCOUNTER — Telehealth: Payer: Self-pay | Admitting: Oncology

## 2020-01-06 LAB — KAPPA/LAMBDA LIGHT CHAINS
Kappa free light chain: 26.4 mg/L — ABNORMAL HIGH (ref 3.3–19.4)
Kappa, lambda light chain ratio: 1.25 (ref 0.26–1.65)
Lambda free light chains: 21.2 mg/L (ref 5.7–26.3)

## 2020-01-06 LAB — PROTEIN ELECTROPHORESIS, SERUM
A/G Ratio: 1.4 (ref 0.7–1.7)
Albumin ELP: 3.8 g/dL (ref 2.9–4.4)
Alpha-1-Globulin: 0.2 g/dL (ref 0.0–0.4)
Alpha-2-Globulin: 0.7 g/dL (ref 0.4–1.0)
Beta Globulin: 1 g/dL (ref 0.7–1.3)
Gamma Globulin: 1 g/dL (ref 0.4–1.8)
Globulin, Total: 2.8 g/dL (ref 2.2–3.9)
Total Protein ELP: 6.6 g/dL (ref 6.0–8.5)

## 2020-01-06 NOTE — Telephone Encounter (Signed)
Scheduled appts per 4/19 los. Left voicemail with appt details.

## 2020-01-20 NOTE — Progress Notes (Signed)
Pharmacist Chemotherapy Monitoring - Follow Up Assessment    I verify that I have reviewed each item in the below checklist:  . Regimen for the patient is scheduled for the appropriate day and plan matches scheduled date. [name] Appropriate non-routine labs are ordered dependent on drug ordered. . If applicable, additional medications reviewed and ordered per protocol based on lifetime cumulative doses and/or treatment regimen.   Plan for follow-up and/or issues identified: No . I-vent associated with next due treatment: No . MD and/or nursing notified: No  Livy Ross K 01/20/2020 11:23 AM

## 2020-01-26 ENCOUNTER — Ambulatory Visit: Payer: 59

## 2020-01-26 ENCOUNTER — Inpatient Hospital Stay: Payer: Medicare Other | Attending: Oncology

## 2020-01-26 ENCOUNTER — Inpatient Hospital Stay: Payer: Medicare Other

## 2020-01-26 ENCOUNTER — Other Ambulatory Visit: Payer: Self-pay | Admitting: *Deleted

## 2020-01-26 ENCOUNTER — Other Ambulatory Visit: Payer: 59

## 2020-01-26 ENCOUNTER — Other Ambulatory Visit: Payer: Self-pay

## 2020-01-26 VITALS — BP 118/73 | HR 69 | Temp 98.3°F | Resp 18

## 2020-01-26 DIAGNOSIS — Z9481 Bone marrow transplant status: Secondary | ICD-10-CM

## 2020-01-26 DIAGNOSIS — Z5112 Encounter for antineoplastic immunotherapy: Secondary | ICD-10-CM | POA: Insufficient documentation

## 2020-01-26 DIAGNOSIS — C9000 Multiple myeloma not having achieved remission: Secondary | ICD-10-CM | POA: Insufficient documentation

## 2020-01-26 LAB — CBC WITH DIFFERENTIAL (CANCER CENTER ONLY)
Abs Immature Granulocytes: 0.01 10*3/uL (ref 0.00–0.07)
Basophils Absolute: 0 10*3/uL (ref 0.0–0.1)
Basophils Relative: 0 %
Eosinophils Absolute: 0.1 10*3/uL (ref 0.0–0.5)
Eosinophils Relative: 1 %
HCT: 36.5 % — ABNORMAL LOW (ref 39.0–52.0)
Hemoglobin: 12 g/dL — ABNORMAL LOW (ref 13.0–17.0)
Immature Granulocytes: 0 %
Lymphocytes Relative: 19 %
Lymphs Abs: 1.1 10*3/uL (ref 0.7–4.0)
MCH: 33.6 pg (ref 26.0–34.0)
MCHC: 32.9 g/dL (ref 30.0–36.0)
MCV: 102.2 fL — ABNORMAL HIGH (ref 80.0–100.0)
Monocytes Absolute: 0.9 10*3/uL (ref 0.1–1.0)
Monocytes Relative: 16 %
Neutro Abs: 3.7 10*3/uL (ref 1.7–7.7)
Neutrophils Relative %: 64 %
Platelet Count: 101 10*3/uL — ABNORMAL LOW (ref 150–400)
RBC: 3.57 MIL/uL — ABNORMAL LOW (ref 4.22–5.81)
RDW: 15.9 % — ABNORMAL HIGH (ref 11.5–15.5)
WBC Count: 5.7 10*3/uL (ref 4.0–10.5)
nRBC: 0 % (ref 0.0–0.2)

## 2020-01-26 LAB — CMP (CANCER CENTER ONLY)
ALT: 16 U/L (ref 0–44)
AST: 24 U/L (ref 15–41)
Albumin: 3.8 g/dL (ref 3.5–5.0)
Alkaline Phosphatase: 52 U/L (ref 38–126)
Anion gap: 8 (ref 5–15)
BUN: 30 mg/dL — ABNORMAL HIGH (ref 8–23)
CO2: 20 mmol/L — ABNORMAL LOW (ref 22–32)
Calcium: 9 mg/dL (ref 8.9–10.3)
Chloride: 105 mmol/L (ref 98–111)
Creatinine: 1.17 mg/dL (ref 0.61–1.24)
GFR, Est AFR Am: >60 mL/min (ref >60)
GFR, Estimated: >60 mL/min (ref >60)
Glucose, Bld: 78 mg/dL (ref 70–99)
Potassium: 4.7 mmol/L (ref 3.5–5.1)
Sodium: 133 mmol/L — ABNORMAL LOW (ref 135–145)
Total Bilirubin: 0.5 mg/dL (ref 0.3–1.2)
Total Protein: 7 g/dL (ref 6.5–8.1)

## 2020-01-26 MED ORDER — PROCHLORPERAZINE MALEATE 10 MG PO TABS: 10.0000 mg | ORAL_TABLET | Freq: Once | ORAL | Status: DC

## 2020-01-26 MED ORDER — BORTEZOMIB CHEMO SQ INJECTION 3.5 MG (2.5MG/ML)
1.3000 mg/m2 | Freq: Once | INTRAMUSCULAR | Status: AC
  Administered 2020-01-26: 2.5 mg via SUBCUTANEOUS
  Filled 2020-01-26: qty 1

## 2020-01-26 NOTE — Progress Notes (Signed)
Per Dr. [name] patient is OK to treat with platelet of 101

## 2020-01-26 NOTE — Patient Instructions (Signed)
[facility] Discharge Instructions for Patients Receiving Chemotherapy  Today you received the following chemotherapy agents Velcade.  To help prevent nausea and vomiting after your treatment, we encourage you to take your nausea medication as directed.  If you develop nausea and vomiting that is not controlled by your nausea medication, call the clinic.   BELOW ARE SYMPTOMS THAT SHOULD BE REPORTED IMMEDIATELY:  *FEVER GREATER THAN 100.5 F  *CHILLS WITH OR WITHOUT FEVER  NAUSEA AND VOMITING THAT IS NOT CONTROLLED WITH YOUR NAUSEA MEDICATION  *UNUSUAL SHORTNESS OF BREATH  *UNUSUAL BRUISING OR BLEEDING  TENDERNESS IN MOUTH AND THROAT WITH OR WITHOUT PRESENCE OF ULCERS  *URINARY PROBLEMS  *BOWEL PROBLEMS  UNUSUAL RASH Items with * indicate a potential emergency and should be followed up as soon as possible.  Feel free to call the clinic should you have any questions or concerns. The clinic phone number is [phone].  Please show the CHEMO ALERT CARD at check-in to the Emergency Department and triage nurse.   

## 2020-01-27 LAB — KAPPA/LAMBDA LIGHT CHAINS
Kappa free light chain: 26.8 mg/L — ABNORMAL HIGH (ref 3.3–19.4)
Kappa, lambda light chain ratio: 1.35 (ref 0.26–1.65)
Lambda free light chains: 19.8 mg/L (ref 5.7–26.3)

## 2020-01-27 LAB — PROTEIN ELECTROPHORESIS, SERUM
A/G Ratio: 1.2 (ref 0.7–1.7)
Albumin ELP: 3.6 g/dL (ref 2.9–4.4)
Alpha-1-Globulin: 0.2 g/dL (ref 0.0–0.4)
Alpha-2-Globulin: 0.7 g/dL (ref 0.4–1.0)
Beta Globulin: 1 g/dL (ref 0.7–1.3)
Gamma Globulin: 1 g/dL (ref 0.4–1.8)
Globulin, Total: 2.9 g/dL (ref 2.2–3.9)
M-Spike, %: 0.3 g/dL — ABNORMAL HIGH
Total Protein ELP: 6.5 g/dL (ref 6.0–8.5)

## 2020-01-28 ENCOUNTER — Encounter: Payer: Self-pay | Admitting: Oncology

## 2020-02-10 NOTE — Progress Notes (Signed)
Pharmacist Chemotherapy Monitoring - Follow Up Assessment    I verify that I have reviewed each item in the below checklist:   Regimen for the patient is scheduled for the appropriate day and plan matches scheduled date.  Appropriate non-routine labs are ordered dependent on drug ordered.  If applicable, additional medications reviewed and ordered per protocol based on lifetime cumulative doses and/or treatment regimen.   Plan for follow-up and/or issues identified: No  I-vent associated with next due treatment: No  MD and/or nursing notified: No   Kennith Center, Pharm.D., CPP 02/10/2020@8 :43 AM

## 2020-02-13 ENCOUNTER — Other Ambulatory Visit: Payer: Self-pay | Admitting: *Deleted

## 2020-02-13 DIAGNOSIS — C9000 Multiple myeloma not having achieved remission: Secondary | ICD-10-CM

## 2020-02-17 ENCOUNTER — Inpatient Hospital Stay: Payer: Medicare Other | Attending: Oncology

## 2020-02-17 ENCOUNTER — Inpatient Hospital Stay: Payer: Medicare Other

## 2020-02-17 ENCOUNTER — Other Ambulatory Visit: Payer: Self-pay

## 2020-02-17 VITALS — BP 123/72 | HR 66 | Temp 97.8°F | Resp 18

## 2020-02-17 DIAGNOSIS — C9000 Multiple myeloma not having achieved remission: Secondary | ICD-10-CM | POA: Insufficient documentation

## 2020-02-17 DIAGNOSIS — Z5112 Encounter for antineoplastic immunotherapy: Secondary | ICD-10-CM | POA: Diagnosis present

## 2020-02-17 DIAGNOSIS — Z9481 Bone marrow transplant status: Secondary | ICD-10-CM

## 2020-02-17 LAB — CMP (CANCER CENTER ONLY)
ALT: 17 U/L (ref 0–44)
AST: 22 U/L (ref 15–41)
Albumin: 3.8 g/dL (ref 3.5–5.0)
Alkaline Phosphatase: 54 U/L (ref 38–126)
Anion gap: 11 (ref 5–15)
BUN: 27 mg/dL — ABNORMAL HIGH (ref 8–23)
CO2: 19 mmol/L — ABNORMAL LOW (ref 22–32)
Calcium: 9.4 mg/dL (ref 8.9–10.3)
Chloride: 104 mmol/L (ref 98–111)
Creatinine: 1.16 mg/dL (ref 0.61–1.24)
GFR, Est AFR Am: >60 mL/min (ref >60)
GFR, Estimated: >60 mL/min (ref >60)
Glucose, Bld: 85 mg/dL (ref 70–99)
Potassium: 4.5 mmol/L (ref 3.5–5.1)
Sodium: 134 mmol/L — ABNORMAL LOW (ref 135–145)
Total Bilirubin: 0.3 mg/dL (ref 0.3–1.2)
Total Protein: 7 g/dL (ref 6.5–8.1)

## 2020-02-17 LAB — CBC WITH DIFFERENTIAL (CANCER CENTER ONLY)
Abs Immature Granulocytes: 0.01 10*3/uL (ref 0.00–0.07)
Basophils Absolute: 0 10*3/uL (ref 0.0–0.1)
Basophils Relative: 0 %
Eosinophils Absolute: 0.1 10*3/uL (ref 0.0–0.5)
Eosinophils Relative: 1 %
HCT: 34.3 % — ABNORMAL LOW (ref 39.0–52.0)
Hemoglobin: 11.5 g/dL — ABNORMAL LOW (ref 13.0–17.0)
Immature Granulocytes: 0 %
Lymphocytes Relative: 24 %
Lymphs Abs: 1.6 10*3/uL (ref 0.7–4.0)
MCH: 34.2 pg — ABNORMAL HIGH (ref 26.0–34.0)
MCHC: 33.5 g/dL (ref 30.0–36.0)
MCV: 102.1 fL — ABNORMAL HIGH (ref 80.0–100.0)
Monocytes Absolute: 0.9 10*3/uL (ref 0.1–1.0)
Monocytes Relative: 13 %
Neutro Abs: 4.1 10*3/uL (ref 1.7–7.7)
Neutrophils Relative %: 62 %
Platelet Count: 100 10*3/uL — ABNORMAL LOW (ref 150–400)
RBC: 3.36 MIL/uL — ABNORMAL LOW (ref 4.22–5.81)
RDW: 15.1 % (ref 11.5–15.5)
WBC Count: 6.7 10*3/uL (ref 4.0–10.5)
nRBC: 0 % (ref 0.0–0.2)

## 2020-02-17 MED ORDER — PROCHLORPERAZINE MALEATE 10 MG PO TABS: 10.0000 mg | ORAL_TABLET | Freq: Once | ORAL | Status: DC

## 2020-02-17 MED ORDER — BORTEZOMIB CHEMO SQ INJECTION 3.5 MG (2.5MG/ML)
1.3000 mg/m2 | Freq: Once | INTRAMUSCULAR | Status: AC
  Administered 2020-02-17: 2.5 mg via SUBCUTANEOUS
  Filled 2020-02-17: qty 1

## 2020-02-17 NOTE — Patient Instructions (Signed)
[facility] Discharge Instructions for Patients Receiving Chemotherapy  Today you received the following chemotherapy agents Velcade.  To help prevent nausea and vomiting after your treatment, we encourage you to take your nausea medication as directed.  If you develop nausea and vomiting that is not controlled by your nausea medication, call the clinic.   BELOW ARE SYMPTOMS THAT SHOULD BE REPORTED IMMEDIATELY:  *FEVER GREATER THAN 100.5 F  *CHILLS WITH OR WITHOUT FEVER  NAUSEA AND VOMITING THAT IS NOT CONTROLLED WITH YOUR NAUSEA MEDICATION  *UNUSUAL SHORTNESS OF BREATH  *UNUSUAL BRUISING OR BLEEDING  TENDERNESS IN MOUTH AND THROAT WITH OR WITHOUT PRESENCE OF ULCERS  *URINARY PROBLEMS  *BOWEL PROBLEMS  UNUSUAL RASH Items with * indicate a potential emergency and should be followed up as soon as possible.  Feel free to call the clinic should you have any questions or concerns. The clinic phone number is [phone].  Please show the CHEMO ALERT CARD at check-in to the Emergency Department and triage nurse.   

## 2020-02-23 ENCOUNTER — Telehealth: Payer: Self-pay

## 2020-02-23 ENCOUNTER — Other Ambulatory Visit: Payer: Self-pay

## 2020-02-23 ENCOUNTER — Telehealth: Payer: Self-pay | Admitting: Internal Medicine

## 2020-02-23 MED ORDER — PANTOPRAZOLE SODIUM 40 MG PO TBEC: DELAYED_RELEASE_TABLET | ORAL | 3 refills | Status: DC

## 2020-02-23 MED ORDER — ESCITALOPRAM OXALATE 10 MG PO TABS: 10.0000 mg | ORAL_TABLET | Freq: Every day | ORAL | 1 refills | Status: DC

## 2020-02-23 NOTE — Telephone Encounter (Signed)
    1.Medication Requested:escitalopram (LEXAPRO) 10 MG tablet  2. Pharmacy (Name, [place], [facility], [place] - [address] AT [address]  3. On Med List: yes  4. Last Visit with PCP: 11/20/19  5. Next visit date with PCP: 04/08/20   Agent: Please be advised that RX refills may take up to 3 business days. We ask that you follow-up with your pharmacy.

## 2020-02-23 NOTE — Telephone Encounter (Signed)
Refill sent. See meds.  

## 2020-02-23 NOTE — Telephone Encounter (Signed)
Pt called and LVM requesting prescription refill for Pantoprazole to [facility] on the [address]. Called pt to inform him this prescription has been sent in.

## 2020-03-08 ENCOUNTER — Inpatient Hospital Stay: Payer: Medicare Other

## 2020-03-08 ENCOUNTER — Other Ambulatory Visit: Payer: Self-pay

## 2020-03-08 VITALS — BP 125/72 | HR 66 | Temp 98.5°F | Resp 16 | Ht 69.0 in | Wt 177.5 lb

## 2020-03-08 DIAGNOSIS — Z5112 Encounter for antineoplastic immunotherapy: Secondary | ICD-10-CM | POA: Diagnosis not present

## 2020-03-08 DIAGNOSIS — C9000 Multiple myeloma not having achieved remission: Secondary | ICD-10-CM

## 2020-03-08 DIAGNOSIS — Z9481 Bone marrow transplant status: Secondary | ICD-10-CM

## 2020-03-08 LAB — CMP (CANCER CENTER ONLY)
ALT: 18 U/L (ref 0–44)
AST: 22 U/L (ref 15–41)
Albumin: 3.9 g/dL (ref 3.5–5.0)
Alkaline Phosphatase: 60 U/L (ref 38–126)
Anion gap: 11 (ref 5–15)
BUN: 31 mg/dL — ABNORMAL HIGH (ref 8–23)
CO2: 22 mmol/L (ref 22–32)
Calcium: 9.8 mg/dL (ref 8.9–10.3)
Chloride: 105 mmol/L (ref 98–111)
Creatinine: 1.21 mg/dL (ref 0.61–1.24)
GFR, Est AFR Am: >60 mL/min (ref >60)
GFR, Estimated: >60 mL/min (ref >60)
Glucose, Bld: 92 mg/dL (ref 70–99)
Potassium: 5.2 mmol/L — ABNORMAL HIGH (ref 3.5–5.1)
Sodium: 138 mmol/L (ref 135–145)
Total Bilirubin: 0.4 mg/dL (ref 0.3–1.2)
Total Protein: 7.3 g/dL (ref 6.5–8.1)

## 2020-03-08 LAB — CBC WITH DIFFERENTIAL (CANCER CENTER ONLY)
Abs Immature Granulocytes: 0.01 10*3/uL (ref 0.00–0.07)
Basophils Absolute: 0 10*3/uL (ref 0.0–0.1)
Basophils Relative: 1 %
Eosinophils Absolute: 0.1 10*3/uL (ref 0.0–0.5)
Eosinophils Relative: 2 %
HCT: 36.1 % — ABNORMAL LOW (ref 39.0–52.0)
Hemoglobin: 12.1 g/dL — ABNORMAL LOW (ref 13.0–17.0)
Immature Granulocytes: 0 %
Lymphocytes Relative: 24 %
Lymphs Abs: 1.2 10*3/uL (ref 0.7–4.0)
MCH: 34.4 pg — ABNORMAL HIGH (ref 26.0–34.0)
MCHC: 33.5 g/dL (ref 30.0–36.0)
MCV: 102.6 fL — ABNORMAL HIGH (ref 80.0–100.0)
Monocytes Absolute: 0.7 10*3/uL (ref 0.1–1.0)
Monocytes Relative: 14 %
Neutro Abs: 3 10*3/uL (ref 1.7–7.7)
Neutrophils Relative %: 59 %
Platelet Count: 101 10*3/uL — ABNORMAL LOW (ref 150–400)
RBC: 3.52 MIL/uL — ABNORMAL LOW (ref 4.22–5.81)
RDW: 15 % (ref 11.5–15.5)
WBC Count: 5 10*3/uL (ref 4.0–10.5)
nRBC: 0 % (ref 0.0–0.2)

## 2020-03-08 MED ORDER — PROCHLORPERAZINE MALEATE 10 MG PO TABS: 10.0000 mg | ORAL_TABLET | Freq: Once | ORAL | Status: DC

## 2020-03-08 MED ORDER — ZOLEDRONIC ACID 4 MG/100ML IV SOLN
INTRAVENOUS | Status: AC
  Filled 2020-03-08: qty 100

## 2020-03-08 MED ORDER — BORTEZOMIB CHEMO SQ INJECTION 3.5 MG (2.5MG/ML)
1.3000 mg/m2 | Freq: Once | INTRAMUSCULAR | Status: AC
  Administered 2020-03-08: 2.5 mg via SUBCUTANEOUS
  Filled 2020-03-08: qty 1

## 2020-03-08 MED ORDER — ZOLEDRONIC ACID 4 MG/100ML IV SOLN
4.0000 mg | Freq: Once | INTRAVENOUS | Status: AC
  Administered 2020-03-08: 4 mg via INTRAVENOUS

## 2020-03-08 NOTE — Patient Instructions (Signed)
[facility] Discharge Instructions for Patients Receiving Chemotherapy  Today you received the following chemotherapy agents Velcade.  To help prevent nausea and vomiting after your treatment, we encourage you to take your nausea medication as directed.  If you develop nausea and vomiting that is not controlled by your nausea medication, call the clinic.   BELOW ARE SYMPTOMS THAT SHOULD BE REPORTED IMMEDIATELY:  *FEVER GREATER THAN 100.5 F  *CHILLS WITH OR WITHOUT FEVER  NAUSEA AND VOMITING THAT IS NOT CONTROLLED WITH YOUR NAUSEA MEDICATION  *UNUSUAL SHORTNESS OF BREATH  *UNUSUAL BRUISING OR BLEEDING  TENDERNESS IN MOUTH AND THROAT WITH OR WITHOUT PRESENCE OF ULCERS  *URINARY PROBLEMS  *BOWEL PROBLEMS  UNUSUAL RASH Items with * indicate a potential emergency and should be followed up as soon as possible.  Feel free to call the clinic should you have any questions or concerns. The clinic phone number is [phone].  Please show the CHEMO ALERT CARD at check-in to the Emergency Department and triage nurse.   

## 2020-03-09 LAB — PROTEIN ELECTROPHORESIS, SERUM
A/G Ratio: 1.2 (ref 0.7–1.7)
Albumin ELP: 3.5 g/dL (ref 2.9–4.4)
Alpha-1-Globulin: 0.2 g/dL (ref 0.0–0.4)
Alpha-2-Globulin: 0.7 g/dL (ref 0.4–1.0)
Beta Globulin: 1 g/dL (ref 0.7–1.3)
Gamma Globulin: 1 g/dL (ref 0.4–1.8)
Globulin, Total: 3 g/dL (ref 2.2–3.9)
M-Spike, %: 0.1 g/dL — ABNORMAL HIGH
Total Protein ELP: 6.5 g/dL (ref 6.0–8.5)

## 2020-03-09 LAB — KAPPA/LAMBDA LIGHT CHAINS
Kappa free light chain: 29.5 mg/L — ABNORMAL HIGH (ref 3.3–19.4)
Kappa, lambda light chain ratio: 1.26 (ref 0.26–1.65)
Lambda free light chains: 23.4 mg/L (ref 5.7–26.3)

## 2020-03-15 ENCOUNTER — Encounter: Payer: Self-pay | Admitting: Oncology

## 2020-03-23 ENCOUNTER — Other Ambulatory Visit: Payer: Self-pay | Admitting: Oncology

## 2020-03-28 NOTE — Progress Notes (Signed)
Andre [name]  [phone] [phone]    ID: Andre [name]  DOB: Jun 24, 1951  MR#: [id number] CSN#: [id number]   Patient Care Team: Cassandria Anger, MD as PCP - General (Internal Medicine) Susana Duell, [name], MD as Consulting Physician (Oncology) Concepcion Living, MD as Consulting Physician (Neurology) [name], MD as Consulting Physician (Internal Medicine) Kathrynn Ducking, MD as Consulting Physician (Neurology) Dorna Bloom, DDS as Referring Physician (Oral Surgery) OTHERS: Augustina Mood DDS   CHIEF COMPLAINT: Multiple myeloma  CURRENT TREATMENT: bortezomib every 21 days, zoledronate every 12 weeks   INTERVAL HISTORY:  Andre [name] returns today for follow-up and treatment of his multiple myeloma. He was last seen here on 01/05/2020.   He continues on bortezomib. He is due for a treatment today.  He tolerates this well and specifically currently denies any peripheral neuropathy.  He also continues on zolendronate, which was last received on 03/08/2020.   Since his last visit here, he saw Dr. Alvie Heidelberg at [facility].  She encouraged him to not only continue on his every 3-week bortezomib but to increase monitoring so that we can jump in more quickly if there is disease progression    REVIEW OF SYSTEMS: Andre [name] has normal activity for his age.  He did just go on a hike and after about 3 miles he could really not go much further.  That is of concern.  Otherwise he does exercise around the house, and by taking walks in his neighborhood.  There have been no intercurrent fevers, no bleeding, no rash, and no pain.  Currently he denies peripheral neuropathy.  A detailed review of systems was otherwise stable  HISTORY OF PRESENT ILLNESS:  From the original intake nodes:  Muadh Dayln Tugwell was worked up for peptic ulcer disease in August of 2011, with significant bleeding and anemia.  The patient was Helicobacter pylori negative.  He  received some epinephrine when he had his EGD and then started on Protonix.  [name] anemia slowly resolved so that by September, his hemoglobin was up to 10.9 and by earlier this month, his hemoglobin was up to 12.5.    As part of his general workup, he was found to have a slightly elevated globulin fraction.  In September, his total protein was 8.3 with an albumin of 3.8.  In January, the total protein was 8.4 and albumin 3.6.  With persistence of this slight abnormality, Dr. Olevia Perches obtained serum immunofixation and SPEP.  The SPTP showed an M-spike of 2.67 grams.  A total IgG was 4,190.  Total IgA low at 28.  Total IgM low at 28 also.  The immunofixation showed a monoclonal IgG lambda paraprotein.  There were also monoclonal free lambda light chains present. With this information, the patient was referred for further evaluation.   A diagnosis of myeloma was confirmed by bone marrow biopsy and subsequebnt treatment is as detailed below   PAST MEDICAL HISTORY: Past Medical History:  Diagnosis Date  . Allergy   . Anemia   . Arthritis   . Asthma    no treatment x 20 years  . Depression   . Double vision    occurs at times   . Duodenal ulcer   . GERD (gastroesophageal reflux disease)   . Hyperlipidemia   . Hypertension   . Hypothyroidism   . Multiple myeloma 07/04/2011  . Thyroid disease    Significant for peptic ulcer disease as noted above.  History of hyperlipidemia.  History of  anxiety and depression.  History of GERD which is significantly improved with weight loss and history of reactive airway disease, possibly secondary to the GERD which also improved with weight loss.  There was a history of obesity, now much improved secondary to exercise and diet.    PAST SURGICAL HISTORY: Past Surgical History:  Procedure Laterality Date  . BONE MARROW TRANSPLANT  2011   for MM  . CARDIOLITE STUDY  11/25/2003   NORMAL  . TONSILLECTOMY      FAMILY HISTORY: Family History  Problem  Relation Age of Onset  . Throat cancer Mother   . Hypertension Father   . Stroke Father   . Asthma Father   . Diabetes Father   . Pancreatic cancer Brother    [name] father died at the age of 19 following a stroke.  [name] mother died with cancer of the throat at the age of 11.   She had a history of depression and was a smoker.  Not clear how much alcohol she drank, according to the patient.  The patient had two brothers.  One died from the age of 97 from a "kidney problem."  The second one died at the age of 40 from pancreatic cancer. (This may have been duodenal cancer. 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  His wife of 37+ years was [name], but they divorced. In 2016 he remarried, his current wife's name is [name].  The patient has two daughters:  [name], who works in [place], who had her second child April 2016. Both daughters live here in [place].  Eves first daughter, [name], born 03/06/2013, apparently has [place]/Rendu. The patient is not a church attender.  Andre [name] also participates in [place] even though actually there is no alcohol or drug problem in the family.    ADVANCED DIRECTIVES: In place   HEALTH MAINTENANCE:  Social History   Tobacco Use  . Smoking status: Former Smoker    Years: 3.00    Quit date: 03/27/1969    Years since quitting: 51.0  . Smokeless tobacco: Never Used  Substance Use Topics  . Alcohol use: No  . Drug use: No    Colonoscopy: January 2015  PSA: December 2018, normal/Dr. [name]  Lipid panel: per Dr. Alain Marion  Allergies  Allergen Reactions  . Atorvastatin Other (See Comments)  . Rosuvastatin     Other reaction(s): Liver Disorder  . Crestor [Rosuvastatin Calcium]     ADVERSE EFFECTS ON LIVER  . Lipitor [Atorvastatin  Calcium]   . Septra [Sulfamethoxazole-Trimethoprim] Rash   Current Outpatient Medications  Medication Sig Dispense Refill  . calcium-vitamin D (OSCAL WITH D) 500-200 MG-UNIT tablet Take 1 tablet by mouth daily.    . cetirizine (ZYRTEC) 10 MG tablet Take 10 mg by mouth daily.    . Cholecalciferol (VITAMIN D3) 2000 units capsule Take 1 capsule (2,000 Units total) by mouth daily. 100 capsule 3  . docusate sodium (COLACE) 100 MG capsule Take 100 mg by mouth daily as needed for mild constipation or moderate constipation.    [name] escitalopram (LEXAPRO) 10 MG tablet Take 1 tablet (10 mg total) by mouth daily. 90 tablet 1  . HYDROCORTISONE ACE, RECTAL, 30 MG SUPP 1 pr bid x 7 d prn 14 suppository 3  . levothyroxine (SYNTHROID)  150 MCG tablet TAKE 1 TABLET BY MOUTH  EVERY MORNING ON AN EMPTY  STOMACH 90 tablet 3  . ondansetron (ZOFRAN) 8 MG tablet Take 1 tablet (8 mg total) by mouth every 8 (eight) hours as needed for nausea or vomiting. 20 tablet 3  . pantoprazole (PROTONIX) 40 MG tablet TAKE 1 TABLET BY MOUTH  TWICE A DAY BEFORE MEALS 180 tablet 3  . triamcinolone ointment (KENALOG) 0.5 % Apply 1 application topically 2 (two) times daily. 30 g 3  . valACYclovir (VALTREX) 1000 MG tablet Take 1 tablet (1,000 mg total) by mouth daily. 90 tablet 3  . zolendronic acid (ZOMETA) 4 MG/5ML injection Inject 4 mg into the vein every 3 (three) months.     No current facility-administered medications for this visit.   Objective: white man who appears stated age 39:   03/29/20 1144  BP: 120/62  Pulse: 79  Resp: 18  Temp: 98.5 F (36.9 C)  SpO2: 96%   Wt Readings from Last 3 Encounters:  03/29/20 181 lb 3.2 oz (82.2 kg)  03/08/20 177 lb 8 oz (80.5 kg)  01/05/20 180 lb 3.2 oz (81.7 kg)   Body mass index is 26.76 kg/m.    ECOG FS:1 - Symptomatic but completely ambulatory  Ocular: Sclerae unicteric, pupils round and equal Ear-nose-throat: Wearing a mask Lymphatic: No cervical or supraclavicular  adenopathy Lungs no rales or rhonchi Heart regular rate and rhythm Abd soft, nontender, positive bowel sounds MSK no focal spinal tenderness, no joint edema Neuro: non-focal, well-oriented, appropriate affect   LAB RESULTS:  Appointment on 03/29/2020  Component Date Value Ref Range Status  . WBC Count 03/29/2020 5.7  4.0 - 10.5 K/uL Final  . RBC 03/29/2020 3.26* 4.22 - 5.81 MIL/uL Final  . Hemoglobin 03/29/2020 11.3* 13.0 - 17.0 g/dL Final  . HCT 03/29/2020 33.8* 39 - 52 % Final  . MCV 03/29/2020 103.7* 80.0 - 100.0 fL Final  . MCH 03/29/2020 34.7* 26.0 - 34.0 pg Final  . MCHC 03/29/2020 33.4  30.0 - 36.0 g/dL Final  . RDW 03/29/2020 14.9  11.5 - 15.5 % Final  . Platelet Count 03/29/2020 97* 150 - 400 K/uL Final  . nRBC 03/29/2020 0.0  0.0 - 0.2 % Final  . Neutrophils Relative % 03/29/2020 63  % Final  . Neutro Abs 03/29/2020 3.5  1.7 - 7.7 K/uL Final  . Lymphocytes Relative 03/29/2020 22  % Final  . Lymphs Abs 03/29/2020 1.2  0.7 - 4.0 K/uL Final  . Monocytes Relative 03/29/2020 14  % Final  . Monocytes Absolute 03/29/2020 0.8  0 - 1 K/uL Final  . Eosinophils Relative 03/29/2020 1  % Final  . Eosinophils Absolute 03/29/2020 0.1  0 - 0 K/uL Final  . Basophils Relative 03/29/2020 0  % Final  . Basophils Absolute 03/29/2020 0.0  0 - 0 K/uL Final  . Immature Granulocytes 03/29/2020 0  % Final  . Abs Immature Granulocytes 03/29/2020 0.02  0.00 - 0.07 K/uL Final   Performed at [facility], [place] [address], [place], [place] [address]    STUDIES: No results found.    ASSESSMENT: 69 y.o. Andre [name] man with a history of multiple myeloma diagnosed February of 2012, with an initial M-spike of 2.6 g/dL, IFE showing an IgG lambda paraprotein and free lambda light chains in the urine. Cytogenetics showed trisomy 49. Bone marrow biopsy showed a 22% plasmacytosis. Treated with   (1) bortezomib (subcutaneously), lenalidomide, and dexamethasone, with repeat bone  marrow biopsy May of 2012 showing 10% plasmacytosis   (2) high-dose chemotherapy with BCNU and melphalan at [facility], followed by stem cell rescue July of 2012   (3) on zoledronic acid started December of 2012, initially monthly, currently given every 3 months, most recent dose  12/07/2015  (4) low-dose lenalidomide resumed April 2013, interrupted several times.  Resumed again on 02/19/2013, ata dose of 5 mg daily, 21 days on and 7 days off, later further reduced to 7 days on, 7 days off  (5) CNS symptoms and abnormal brain MRI extensively evaluated by neurology with no definitive diagnosis established  (6) rising M spike noted June 2014 but did not meet criteria for carfilzomib study  (7) on lenalidomide 25 mg daily, 14 days on, 7 days off, starting 04/18/2013, interrupted December 2014 because of rash;   (a) resumed January 2015 at 10 mg/ day at 21 days on/ 7 days off  (Roen) starting 08/18/2014 decreased to 10 mg/ day 14 days on, 7 days off because of cytopenias  (c) as of February 2016 was on 5 mg daily 7 days on 7 days off  (d) lenalidomide discontinued December 2016 with evidence of disease progression  (8) transient global amnesia 05/29/2015, resolved without intervention  (9) starting PVD 10/25/2015 w ASA 325 thromboprophylaxis, valacyclovir prophylaxis, last dose 12/17/2015  (a) pomalidomide 4 mg/d days 1-14  (Stace) bortezomib sQ days 2,5,9,12 of each 21 day cycle  (c) dexamethasome 20 mg two days a week  (d) dexamethasone bortezomib and pomalidomide discontinued late December 2018 with poor tolerance  (10) metapneumovirus pneumonia April 2017  (a) completing course of steroids and week of bactrim mid April 2017  (11) status post second autologous transplant at [facility] 02/04/2016(preparatory regimen melphalan 200 mg/m)  (a) received twelve-month vaccinations 03/14/2017 (DPT, Haemophilus, Pneumovax 13, polio)  ([place]) 14 months injections 05/04/2016 include DTaP, Hib conjugate, HepB energex Itay 20  mcg/ml, Prevnar 13  (c) 24 month vaccines due at [facility] June 2019  (12) maintenance therapy started November 2017, consisting of  (a) bortezomib 1.3 mg/M2 every 14 days, first dose 07/27/2016  ([place]) pomalidomide 1 mg days 1-21 Q28 days, started 07/19/2016  (c) zolendronate monthly started 07/27/2016 (previously Q 3 months) however patient unable to tolerate, and changed back to q3 months in April, 2018   (d) Bortezomib changed to monthly as of June 2018 because of tolerance issues, however discontinued after 09/14/2017 dose because of a rise in his M spike  (e) pomalidomide held after 10/18/2017 in preparation for possible study at [place] (venetoclax)--never resumed  (f) with numbers actually improved off treatment, resumed every 2-week bortezomib 12/25/2017  (g) changed to every 3-week bortezomib as of 03/17/2019   PLAN:  Andre [name] is now 9-1/2 years out from initial diagnosis of multiple myeloma.  His disease continues to be well controlled.  Currently he is being treated with every 3-week bortezomib alone.  He does saw Dr. Alvie Heidelberg.  She expressed concerns to him regarding a possible rise in his M spike in which case of course we would have to get him back into remission and then go to car T-cell treatments.  She is suggested also obtaining a bone survey, which I have just ordered.  Finally she suggested we monitor him a little bit more closely.  I am going to write for IgG every 3 weeks.  We will continue to do the SPEP and kappa lambda every 6weeks.  Andre [name] is very agreeable to this plan.  As far as his immune  system is concerned he understands even though he has been vaccinated he is unlikely to have mounted a very good response and therefore he really needs to act as if he had not been vaccinated and go ahead and mask, distance can wash hands as before  He will see me again in 3 months.  He knows to call for any other issue that may develop before then.  Total encounter time 25  minutes.*  Vera Wishart, [name], MD  03/29/20 11:55 AM Medical Oncology and Hematology [facility], [place] [address] Tel. [phone]    Fax. [phone]    I, Jacqualyn Posey am acting as a Education administrator for Chauncey Cruel, MD.      *Total Encounter Time as defined by the Centers for Medicare and Medicaid Services includes, in addition to the face-to-face time of a patient visit (documented in the note above) non-face-to-face time: obtaining and reviewing outside history, ordering and reviewing medications, tests or procedures, care coordination (communications with other health care professionals or caregivers) and documentation in the medical record.

## 2020-03-29 ENCOUNTER — Inpatient Hospital Stay (HOSPITAL_BASED_OUTPATIENT_CLINIC_OR_DEPARTMENT_OTHER): Payer: Medicare Other | Admitting: Oncology

## 2020-03-29 ENCOUNTER — Other Ambulatory Visit: Payer: Self-pay

## 2020-03-29 ENCOUNTER — Inpatient Hospital Stay: Payer: Medicare Other

## 2020-03-29 ENCOUNTER — Inpatient Hospital Stay: Payer: Medicare Other | Attending: Oncology

## 2020-03-29 VITALS — BP 120/62 | HR 79 | Temp 98.5°F | Resp 18 | Ht 69.0 in | Wt 181.2 lb

## 2020-03-29 DIAGNOSIS — Z5112 Encounter for antineoplastic immunotherapy: Secondary | ICD-10-CM | POA: Diagnosis not present

## 2020-03-29 DIAGNOSIS — Z79899 Other long term (current) drug therapy: Secondary | ICD-10-CM | POA: Diagnosis not present

## 2020-03-29 DIAGNOSIS — Z9481 Bone marrow transplant status: Secondary | ICD-10-CM

## 2020-03-29 DIAGNOSIS — G620 Drug-induced polyneuropathy: Secondary | ICD-10-CM | POA: Diagnosis not present

## 2020-03-29 DIAGNOSIS — C9000 Multiple myeloma not having achieved remission: Secondary | ICD-10-CM

## 2020-03-29 DIAGNOSIS — T451X5A Adverse effect of antineoplastic and immunosuppressive drugs, initial encounter: Secondary | ICD-10-CM

## 2020-03-29 DIAGNOSIS — R9089 Other abnormal findings on diagnostic imaging of central nervous system: Secondary | ICD-10-CM | POA: Diagnosis not present

## 2020-03-29 LAB — CBC WITH DIFFERENTIAL (CANCER CENTER ONLY)
Abs Immature Granulocytes: 0.02 10*3/uL (ref 0.00–0.07)
Basophils Absolute: 0 10*3/uL (ref 0.0–0.1)
Basophils Relative: 0 %
Eosinophils Absolute: 0.1 10*3/uL (ref 0.0–0.5)
Eosinophils Relative: 1 %
HCT: 33.8 % — ABNORMAL LOW (ref 39.0–52.0)
Hemoglobin: 11.3 g/dL — ABNORMAL LOW (ref 13.0–17.0)
Immature Granulocytes: 0 %
Lymphocytes Relative: 22 %
Lymphs Abs: 1.2 10*3/uL (ref 0.7–4.0)
MCH: 34.7 pg — ABNORMAL HIGH (ref 26.0–34.0)
MCHC: 33.4 g/dL (ref 30.0–36.0)
MCV: 103.7 fL — ABNORMAL HIGH (ref 80.0–100.0)
Monocytes Absolute: 0.8 10*3/uL (ref 0.1–1.0)
Monocytes Relative: 14 %
Neutro Abs: 3.5 10*3/uL (ref 1.7–7.7)
Neutrophils Relative %: 63 %
Platelet Count: 97 10*3/uL — ABNORMAL LOW (ref 150–400)
RBC: 3.26 MIL/uL — ABNORMAL LOW (ref 4.22–5.81)
RDW: 14.9 % (ref 11.5–15.5)
WBC Count: 5.7 10*3/uL (ref 4.0–10.5)
nRBC: 0 % (ref 0.0–0.2)

## 2020-03-29 LAB — CMP (CANCER CENTER ONLY)
ALT: 18 U/L (ref 0–44)
AST: 24 U/L (ref 15–41)
Albumin: 3.6 g/dL (ref 3.5–5.0)
Alkaline Phosphatase: 54 U/L (ref 38–126)
Anion gap: 8 (ref 5–15)
BUN: 31 mg/dL — ABNORMAL HIGH (ref 8–23)
CO2: 23 mmol/L (ref 22–32)
Calcium: 9.5 mg/dL (ref 8.9–10.3)
Chloride: 100 mmol/L (ref 98–111)
Creatinine: 1.16 mg/dL (ref 0.61–1.24)
GFR, Est AFR Am: >60 mL/min (ref >60)
GFR, Estimated: >60 mL/min (ref >60)
Glucose, Bld: 88 mg/dL (ref 70–99)
Potassium: 4.6 mmol/L (ref 3.5–5.1)
Sodium: 131 mmol/L — ABNORMAL LOW (ref 135–145)
Total Bilirubin: 0.4 mg/dL (ref 0.3–1.2)
Total Protein: 7.1 g/dL (ref 6.5–8.1)

## 2020-03-29 MED ORDER — PROCHLORPERAZINE MALEATE 10 MG PO TABS: 10.0000 mg | ORAL_TABLET | Freq: Once | ORAL | Status: DC

## 2020-03-29 MED ORDER — BORTEZOMIB CHEMO SQ INJECTION 3.5 MG (2.5MG/ML)
1.3000 mg/m2 | Freq: Once | INTRAMUSCULAR | Status: AC
  Administered 2020-03-29: 2.5 mg via SUBCUTANEOUS
  Filled 2020-03-29: qty 1

## 2020-03-29 NOTE — Progress Notes (Signed)
OK to treat with platelets 97K per Dr [name]

## 2020-03-29 NOTE — Patient Instructions (Signed)
[facility] Discharge Instructions for Patients Receiving Chemotherapy  Today you received the following chemotherapy agents Velcade.  To help prevent nausea and vomiting after your treatment, we encourage you to take your nausea medication as directed.  If you develop nausea and vomiting that is not controlled by your nausea medication, call the clinic.   BELOW ARE SYMPTOMS THAT SHOULD BE REPORTED IMMEDIATELY:  *FEVER GREATER THAN 100.5 F  *CHILLS WITH OR WITHOUT FEVER  NAUSEA AND VOMITING THAT IS NOT CONTROLLED WITH YOUR NAUSEA MEDICATION  *UNUSUAL SHORTNESS OF BREATH  *UNUSUAL BRUISING OR BLEEDING  TENDERNESS IN MOUTH AND THROAT WITH OR WITHOUT PRESENCE OF ULCERS  *URINARY PROBLEMS  *BOWEL PROBLEMS  UNUSUAL RASH Items with * indicate a potential emergency and should be followed up as soon as possible.  Feel free to call the clinic should you have any questions or concerns. The clinic phone number is [phone].  Please show the CHEMO ALERT CARD at check-in to the Emergency Department and triage nurse.   

## 2020-03-30 LAB — KAPPA/LAMBDA LIGHT CHAINS
Kappa free light chain: 28.7 mg/L — ABNORMAL HIGH (ref 3.3–19.4)
Kappa, lambda light chain ratio: 1.24 (ref 0.26–1.65)
Lambda free light chains: 23.1 mg/L (ref 5.7–26.3)

## 2020-03-30 LAB — PROTEIN ELECTROPHORESIS, SERUM
A/G Ratio: 1.3 (ref 0.7–1.7)
Albumin ELP: 3.7 g/dL (ref 2.9–4.4)
Alpha-1-Globulin: 0.2 g/dL (ref 0.0–0.4)
Alpha-2-Globulin: 0.7 g/dL (ref 0.4–1.0)
Beta Globulin: 1 g/dL (ref 0.7–1.3)
Gamma Globulin: 1 g/dL (ref 0.4–1.8)
Globulin, Total: 2.8 g/dL (ref 2.2–3.9)
M-Spike, %: 0.2 g/dL — ABNORMAL HIGH
Total Protein ELP: 6.5 g/dL (ref 6.0–8.5)

## 2020-04-05 ENCOUNTER — Other Ambulatory Visit: Payer: Self-pay

## 2020-04-05 ENCOUNTER — Ambulatory Visit (HOSPITAL_COMMUNITY)
Admission: RE | Admit: 2020-04-05 | Discharge: 2020-04-05 | Disposition: A | Discharge status: Home / Self Care | Payer: Medicare Other | Source: Ambulatory Visit | Attending: Oncology | Admitting: Oncology

## 2020-04-05 DIAGNOSIS — Z9481 Bone marrow transplant status: Secondary | ICD-10-CM | POA: Diagnosis present

## 2020-04-05 DIAGNOSIS — C9000 Multiple myeloma not having achieved remission: Secondary | ICD-10-CM | POA: Diagnosis not present

## 2020-04-05 DIAGNOSIS — G620 Drug-induced polyneuropathy: Secondary | ICD-10-CM | POA: Diagnosis present

## 2020-04-05 DIAGNOSIS — R9089 Other abnormal findings on diagnostic imaging of central nervous system: Secondary | ICD-10-CM | POA: Diagnosis present

## 2020-04-05 DIAGNOSIS — T451X5A Adverse effect of antineoplastic and immunosuppressive drugs, initial encounter: Secondary | ICD-10-CM | POA: Diagnosis present

## 2020-04-08 ENCOUNTER — Other Ambulatory Visit: Payer: Self-pay

## 2020-04-08 ENCOUNTER — Other Ambulatory Visit: Payer: Self-pay | Admitting: Oncology

## 2020-04-08 ENCOUNTER — Encounter: Payer: Self-pay | Admitting: Oncology

## 2020-04-08 ENCOUNTER — Ambulatory Visit (INDEPENDENT_AMBULATORY_CARE_PROVIDER_SITE_OTHER): Payer: Medicare Other | Admitting: Internal Medicine

## 2020-04-08 ENCOUNTER — Encounter: Payer: Self-pay | Admitting: Internal Medicine

## 2020-04-08 DIAGNOSIS — G454 Transient global amnesia: Secondary | ICD-10-CM

## 2020-04-08 DIAGNOSIS — E559 Vitamin D deficiency, unspecified: Secondary | ICD-10-CM

## 2020-04-08 DIAGNOSIS — J4521 Mild intermittent asthma with (acute) exacerbation: Secondary | ICD-10-CM

## 2020-04-08 DIAGNOSIS — G459 Transient cerebral ischemic attack, unspecified: Secondary | ICD-10-CM | POA: Diagnosis not present

## 2020-04-08 DIAGNOSIS — K219 Gastro-esophageal reflux disease without esophagitis: Secondary | ICD-10-CM

## 2020-04-08 DIAGNOSIS — D649 Anemia, unspecified: Secondary | ICD-10-CM

## 2020-04-08 NOTE — Assessment & Plan Note (Signed)
Protonix Pepcid

## 2020-04-08 NOTE — Assessment & Plan Note (Signed)
No relapse 

## 2020-04-08 NOTE — Assessment & Plan Note (Signed)
On Vit D 

## 2020-04-08 NOTE — Assessment & Plan Note (Signed)
Per Dr [name]

## 2020-04-08 NOTE — Progress Notes (Signed)
Subjective:  Patient ID: Andre [name], male    DOB: 01-06-1951  Age: 69 y.o. MRN: [id number]  CC: No chief complaint on file.   HPI Andre [name] presents for MM, anemia, GERD  Outpatient Medications Prior to Visit  Medication Sig Dispense Refill  . calcium-vitamin D (OSCAL WITH D) 500-200 MG-UNIT tablet Take 1 tablet by mouth daily.    . cetirizine (ZYRTEC) 10 MG tablet Take 10 mg by mouth daily.    . Cholecalciferol (VITAMIN D3) 2000 units capsule Take 1 capsule (2,000 Units total) by mouth daily. 100 capsule 3  . docusate sodium (COLACE) 100 MG capsule Take 100 mg by mouth daily as needed for mild constipation or moderate constipation.    [name] escitalopram (LEXAPRO) 10 MG tablet Take 1 tablet (10 mg total) by mouth daily. 90 tablet 1  . HYDROCORTISONE ACE, RECTAL, 30 MG SUPP 1 pr bid x 7 d prn 14 suppository 3  . levothyroxine (SYNTHROID) 150 MCG tablet TAKE 1 TABLET BY MOUTH  EVERY MORNING ON AN EMPTY  STOMACH 90 tablet 3  . ondansetron (ZOFRAN) 8 MG tablet Take 1 tablet (8 mg total) by mouth every 8 (eight) hours as needed for nausea or vomiting. 20 tablet 3  . pantoprazole (PROTONIX) 40 MG tablet TAKE 1 TABLET BY MOUTH  TWICE A DAY BEFORE MEALS 180 tablet 3  . triamcinolone ointment (KENALOG) 0.5 % Apply 1 application topically 2 (two) times daily. 30 g 3  . valACYclovir (VALTREX) 1000 MG tablet Take 1 tablet (1,000 mg total) by mouth daily. 90 tablet 3  . zolendronic acid (ZOMETA) 4 MG/5ML injection Inject 4 mg into the vein every 3 (three) months.     No facility-administered medications prior to visit.    ROS: Review of Systems  Constitutional: Positive for fatigue. Negative for appetite change and unexpected weight change.  HENT: Negative for congestion, nosebleeds, sneezing, sore throat and trouble swallowing.   Eyes: Negative for itching and visual disturbance.  Respiratory: Negative for cough.   Cardiovascular: Negative for chest pain, palpitations and leg swelling.    Gastrointestinal: Negative for abdominal distention, blood in stool, diarrhea and nausea.  Genitourinary: Negative for frequency and hematuria.  Musculoskeletal: Negative for back pain, gait problem, joint swelling and neck pain.  Skin: Negative for rash.  Neurological: Negative for dizziness, tremors, speech difficulty and weakness.  Psychiatric/Behavioral: Negative for agitation, dysphoric mood and sleep disturbance. The patient is not nervous/anxious.     Objective:  BP 116/70 (BP Location: Left Arm, Patient Position: Sitting, Cuff Size: Normal)   Pulse 72   Temp 98.1 F (36.7 C) (Oral)   Ht 5' 9"  (1.753 m)   Wt 177 lb (80.3 kg)   SpO2 98%   BMI 26.14 kg/m   BP Readings from Last 3 Encounters:  04/08/20 116/70  03/29/20 120/62  03/08/20 125/72    Wt Readings from Last 3 Encounters:  04/08/20 177 lb (80.3 kg)  03/29/20 181 lb 3.2 oz (82.2 kg)  03/08/20 177 lb 8 oz (80.5 kg)    Physical Exam Constitutional:      General: He is not in acute distress.    Appearance: He is well-developed.     Comments: NAD  Eyes:     Conjunctiva/sclera: Conjunctivae normal.     Pupils: Pupils are equal, round, and reactive to light.  Neck:     Thyroid: No thyromegaly.     Vascular: No JVD.  Cardiovascular:     Rate and Rhythm:  Normal rate and regular rhythm.     Heart sounds: Normal heart sounds. No murmur heard.  No friction rub. No gallop.   Pulmonary:     Effort: Pulmonary effort is normal. No respiratory distress.     Breath sounds: Normal breath sounds. No wheezing or rales.  Chest:     Chest wall: No tenderness.  Abdominal:     General: Bowel sounds are normal. There is no distension.     Palpations: Abdomen is soft. There is no mass.     Tenderness: There is no abdominal tenderness. There is no guarding or rebound.  Musculoskeletal:        General: No tenderness. Normal range of motion.     Cervical back: Normal range of motion.  Lymphadenopathy:     Cervical: No  cervical adenopathy.  Skin:    General: Skin is warm and dry.     Findings: No rash.  Neurological:     Mental Status: He is alert and oriented to person, place, and time.     Cranial Nerves: No cranial nerve deficit.     Motor: No abnormal muscle tone.     Coordination: Coordination normal.     Gait: Gait normal.     Deep Tendon Reflexes: Reflexes are normal and symmetric.  Psychiatric:        Behavior: Behavior normal.        Thought Content: Thought content normal.        Judgment: Judgment normal.     Lab Results  Component Value Date   WBC 5.7 03/29/2020   HGB 11.3 (L) 03/29/2020   HCT 33.8 (L) 03/29/2020   PLT 97 (L) 03/29/2020   GLUCOSE 88 03/29/2020   CHOL 164 09/06/2017   TRIG 194.0 (H) 09/06/2017   HDL 38.60 (L) 09/06/2017   LDLDIRECT 155.8 11/13/2012   LDLCALC 86 09/06/2017   ALT 18 03/29/2020   AST 24 03/29/2020   NA 131 (L) 03/29/2020   K 4.6 03/29/2020   CL 100 03/29/2020   CREATININE 1.16 03/29/2020   BUN 31 (H) 03/29/2020   CO2 23 03/29/2020   TSH 0.05 (L) 09/06/2017   PSA 1.76 09/06/2017   INR 1.07 02/27/2016   HGBA1C 5.4 03/27/2012    DG Bone Survey Met  Result Date: 04/06/2020 CLINICAL DATA:  History of multiple myeloma. EXAM: METASTATIC BONE SURVEY COMPARISON:  December 07, 2017 FINDINGS: Skull: Enumerable lytic lesions throughout the calvarium. Cervical Spine: Normal bony mineralization. No focal lytic or blastic osseous lesion. Thoracic Spine: Stable T12 compression fracture. Chest: Normal bony mineralization. No focal lytic or blastic osseous lesion. Lumbar Spine: No radiographically apparent lytic lesions. Osteoarthritic changes at multiple levels of the lumbosacral spine with associated posterior facet arthropathy. Pelvis: Normal bony mineralization. No focal lytic or blastic osseous lesion. Right Upper Extremity: Heterogeneous lucent appearance of the bone marrow of the right humerus may represent confluent lytic lesions. Left Upper Extremity:  Acute region and lucent appearance of the bone marrow of the left humerus may represent confluent lytic lesions. Subtle lytic lesions within the distal radius. Right Lower Extremity: Similar suspected confluent lytic lesions throughout the right femur Left Lower Extremity: Similar suspected confluent lytic lesions throughout the left femur. IMPRESSION: 1. Enumerable lytic lesions throughout the calvarium, relatively stable. 2. Confluent lytic lesions within the bilateral humeri and femurs, and scattered lytic lesions within the left radius also relatively stable. Electronically Signed   By: Fidela Salisbury M.D.   On: 04/06/2020 16:25  Assessment & Plan:    [name], MD

## 2020-04-08 NOTE — Assessment & Plan Note (Signed)
No sx's 

## 2020-04-11 ENCOUNTER — Encounter: Payer: Self-pay | Admitting: Internal Medicine

## 2020-04-16 ENCOUNTER — Telehealth: Payer: Self-pay | Admitting: *Deleted

## 2020-04-16 NOTE — Telephone Encounter (Signed)
Pt left VM stating onset of diarrhea x 2 today with some incontinence.  After the first episode he drank gatorade and had a banana which seemed to offset the second bout ( with incontinence ).  He is calling to " see what else I can do ?"   This RN attempted to return call to patient- obtained his VM- message left requesting another call for further discussion including medications pt may have in the home ( pt has remote history of diarrhea ).  Will await return call.

## 2020-04-19 ENCOUNTER — Inpatient Hospital Stay: Payer: Medicare Other | Attending: Oncology

## 2020-04-19 ENCOUNTER — Other Ambulatory Visit: Payer: Self-pay | Admitting: *Deleted

## 2020-04-19 ENCOUNTER — Inpatient Hospital Stay: Payer: Medicare Other

## 2020-04-19 ENCOUNTER — Other Ambulatory Visit: Payer: Self-pay

## 2020-04-19 VITALS — BP 121/76 | HR 67 | Temp 97.8°F | Resp 18 | Wt 173.5 lb

## 2020-04-19 DIAGNOSIS — Z5112 Encounter for antineoplastic immunotherapy: Secondary | ICD-10-CM | POA: Insufficient documentation

## 2020-04-19 DIAGNOSIS — C9000 Multiple myeloma not having achieved remission: Secondary | ICD-10-CM | POA: Diagnosis present

## 2020-04-19 DIAGNOSIS — R197 Diarrhea, unspecified: Secondary | ICD-10-CM

## 2020-04-19 DIAGNOSIS — G620 Drug-induced polyneuropathy: Secondary | ICD-10-CM

## 2020-04-19 DIAGNOSIS — Z9481 Bone marrow transplant status: Secondary | ICD-10-CM

## 2020-04-19 DIAGNOSIS — R9089 Other abnormal findings on diagnostic imaging of central nervous system: Secondary | ICD-10-CM

## 2020-04-19 DIAGNOSIS — T451X5A Adverse effect of antineoplastic and immunosuppressive drugs, initial encounter: Secondary | ICD-10-CM

## 2020-04-19 LAB — CMP (CANCER CENTER ONLY)
ALT: 18 U/L (ref 0–44)
AST: 21 U/L (ref 15–41)
Albumin: 3.7 g/dL (ref 3.5–5.0)
Alkaline Phosphatase: 52 U/L (ref 38–126)
Anion gap: 9 (ref 5–15)
BUN: 12 mg/dL (ref 8–23)
CO2: 23 mmol/L (ref 22–32)
Calcium: 10.7 mg/dL — ABNORMAL HIGH (ref 8.9–10.3)
Chloride: 107 mmol/L (ref 98–111)
Creatinine: 1.2 mg/dL (ref 0.61–1.24)
GFR, Est AFR Am: >60 mL/min (ref >60)
GFR, Estimated: >60 mL/min (ref >60)
Glucose, Bld: 62 mg/dL — ABNORMAL LOW (ref 70–99)
Potassium: 4.4 mmol/L (ref 3.5–5.1)
Sodium: 139 mmol/L (ref 135–145)
Total Bilirubin: 0.4 mg/dL (ref 0.3–1.2)
Total Protein: 6.9 g/dL (ref 6.5–8.1)

## 2020-04-19 LAB — CBC WITH DIFFERENTIAL (CANCER CENTER ONLY)
Abs Immature Granulocytes: 0.01 10*3/uL (ref 0.00–0.07)
Basophils Absolute: 0 10*3/uL (ref 0.0–0.1)
Basophils Relative: 1 %
Eosinophils Absolute: 0.1 10*3/uL (ref 0.0–0.5)
Eosinophils Relative: 2 %
HCT: 36.2 % — ABNORMAL LOW (ref 39.0–52.0)
Hemoglobin: 12 g/dL — ABNORMAL LOW (ref 13.0–17.0)
Immature Granulocytes: 0 %
Lymphocytes Relative: 28 %
Lymphs Abs: 1.2 10*3/uL (ref 0.7–4.0)
MCH: 34.2 pg — ABNORMAL HIGH (ref 26.0–34.0)
MCHC: 33.1 g/dL (ref 30.0–36.0)
MCV: 103.1 fL — ABNORMAL HIGH (ref 80.0–100.0)
Monocytes Absolute: 0.6 10*3/uL (ref 0.1–1.0)
Monocytes Relative: 13 %
Neutro Abs: 2.4 10*3/uL (ref 1.7–7.7)
Neutrophils Relative %: 56 %
Platelet Count: 108 10*3/uL — ABNORMAL LOW (ref 150–400)
RBC: 3.51 MIL/uL — ABNORMAL LOW (ref 4.22–5.81)
RDW: 14.7 % (ref 11.5–15.5)
WBC Count: 4.3 10*3/uL (ref 4.0–10.5)
nRBC: 0 % (ref 0.0–0.2)

## 2020-04-19 MED ORDER — BORTEZOMIB CHEMO SQ INJECTION 3.5 MG (2.5MG/ML)
1.3000 mg/m2 | Freq: Once | INTRAMUSCULAR | Status: AC
  Administered 2020-04-19: 2.5 mg via SUBCUTANEOUS
  Filled 2020-04-19: qty 1

## 2020-04-19 MED ORDER — PROCHLORPERAZINE MALEATE 10 MG PO TABS: 10.0000 mg | ORAL_TABLET | Freq: Once | ORAL | Status: DC

## 2020-04-19 NOTE — Progress Notes (Signed)
Per Andre [name] pt having diarrhea x3 daily even with diet change. Advise pt to take imodium and to proceed with tx per Dr.Magrinat. Order place for stool sample before leaving facility. RN notified pt

## 2020-04-19 NOTE — Patient Instructions (Signed)
[facility] Discharge Instructions for Patients Receiving Chemotherapy  Today you received the following chemotherapy agents Velcade.  To help prevent nausea and vomiting after your treatment, we encourage you to take your nausea medication as directed.  If you develop nausea and vomiting that is not controlled by your nausea medication, call the clinic.   BELOW ARE SYMPTOMS THAT SHOULD BE REPORTED IMMEDIATELY:  *FEVER GREATER THAN 100.5 F  *CHILLS WITH OR WITHOUT FEVER  NAUSEA AND VOMITING THAT IS NOT CONTROLLED WITH YOUR NAUSEA MEDICATION  *UNUSUAL SHORTNESS OF BREATH  *UNUSUAL BRUISING OR BLEEDING  TENDERNESS IN MOUTH AND THROAT WITH OR WITHOUT PRESENCE OF ULCERS  *URINARY PROBLEMS  *BOWEL PROBLEMS  UNUSUAL RASH Items with * indicate a potential emergency and should be followed up as soon as possible.  Feel free to call the clinic should you have any questions or concerns. The clinic phone number is [phone].  Please show the CHEMO ALERT CARD at check-in to the Emergency Department and triage nurse.   

## 2020-04-20 LAB — IGG: IgG (Immunoglobin G), Serum: 933 mg/dL (ref 603–1613)

## 2020-05-03 ENCOUNTER — Encounter: Payer: Self-pay | Admitting: Oncology

## 2020-05-10 ENCOUNTER — Telehealth: Payer: Self-pay | Admitting: Oncology

## 2020-05-10 ENCOUNTER — Inpatient Hospital Stay: Payer: Medicare Other

## 2020-05-10 ENCOUNTER — Other Ambulatory Visit: Payer: Self-pay

## 2020-05-10 ENCOUNTER — Telehealth: Payer: Self-pay | Admitting: *Deleted

## 2020-05-10 VITALS — BP 131/64 | HR 62 | Temp 97.9°F | Resp 18 | Ht 69.0 in | Wt 178.5 lb

## 2020-05-10 DIAGNOSIS — Z9481 Bone marrow transplant status: Secondary | ICD-10-CM

## 2020-05-10 DIAGNOSIS — C9000 Multiple myeloma not having achieved remission: Secondary | ICD-10-CM

## 2020-05-10 DIAGNOSIS — Z5112 Encounter for antineoplastic immunotherapy: Secondary | ICD-10-CM | POA: Diagnosis not present

## 2020-05-10 DIAGNOSIS — T451X5A Adverse effect of antineoplastic and immunosuppressive drugs, initial encounter: Secondary | ICD-10-CM

## 2020-05-10 DIAGNOSIS — R9089 Other abnormal findings on diagnostic imaging of central nervous system: Secondary | ICD-10-CM

## 2020-05-10 LAB — CMP (CANCER CENTER ONLY)
ALT: 13 U/L (ref 0–44)
AST: 21 U/L (ref 15–41)
Albumin: 3.7 g/dL (ref 3.5–5.0)
Alkaline Phosphatase: 52 U/L (ref 38–126)
Anion gap: 7 (ref 5–15)
BUN: 25 mg/dL — ABNORMAL HIGH (ref 8–23)
CO2: 22 mmol/L (ref 22–32)
Calcium: 10 mg/dL (ref 8.9–10.3)
Chloride: 105 mmol/L (ref 98–111)
Creatinine: 1.12 mg/dL (ref 0.61–1.24)
GFR, Est AFR Am: >60 mL/min (ref >60)
GFR, Estimated: >60 mL/min (ref >60)
Glucose, Bld: 86 mg/dL (ref 70–99)
Potassium: 5 mmol/L (ref 3.5–5.1)
Sodium: 134 mmol/L — ABNORMAL LOW (ref 135–145)
Total Bilirubin: 0.4 mg/dL (ref 0.3–1.2)
Total Protein: 6.8 g/dL (ref 6.5–8.1)

## 2020-05-10 LAB — CBC WITH DIFFERENTIAL (CANCER CENTER ONLY)
Abs Immature Granulocytes: 0.01 10*3/uL (ref 0.00–0.07)
Basophils Absolute: 0 10*3/uL (ref 0.0–0.1)
Basophils Relative: 1 %
Eosinophils Absolute: 0.1 10*3/uL (ref 0.0–0.5)
Eosinophils Relative: 2 %
HCT: 32.4 % — ABNORMAL LOW (ref 39.0–52.0)
Hemoglobin: 11 g/dL — ABNORMAL LOW (ref 13.0–17.0)
Immature Granulocytes: 0 %
Lymphocytes Relative: 32 %
Lymphs Abs: 1.3 10*3/uL (ref 0.7–4.0)
MCH: 34 pg (ref 26.0–34.0)
MCHC: 34 g/dL (ref 30.0–36.0)
MCV: 100 fL (ref 80.0–100.0)
Monocytes Absolute: 0.6 10*3/uL (ref 0.1–1.0)
Monocytes Relative: 14 %
Neutro Abs: 2.1 10*3/uL (ref 1.7–7.7)
Neutrophils Relative %: 51 %
Platelet Count: 97 10*3/uL — ABNORMAL LOW (ref 150–400)
RBC: 3.24 MIL/uL — ABNORMAL LOW (ref 4.22–5.81)
RDW: 14.9 % (ref 11.5–15.5)
WBC Count: 4.1 10*3/uL (ref 4.0–10.5)
nRBC: 0 % (ref 0.0–0.2)

## 2020-05-10 MED ORDER — BORTEZOMIB CHEMO SQ INJECTION 3.5 MG (2.5MG/ML)
1.3000 mg/m2 | Freq: Once | INTRAMUSCULAR | Status: AC
  Administered 2020-05-10: 2.5 mg via SUBCUTANEOUS
  Filled 2020-05-10: qty 1

## 2020-05-10 MED ORDER — PROCHLORPERAZINE MALEATE 10 MG PO TABS: 10.0000 mg | ORAL_TABLET | Freq: Once | ORAL | Status: DC

## 2020-05-10 NOTE — Telephone Encounter (Signed)
Per MD ok to treat despite platelets of 97,000. May schedule Covid booster at least 2 days post chemo administration and 10 days prior to next therapy.  This RN sent scheduling request for Covid booster.

## 2020-05-10 NOTE — Patient Instructions (Signed)
[facility] Discharge Instructions for Patients Receiving Chemotherapy  Today you received the following chemotherapy agents Velcade.  To help prevent nausea and vomiting after your treatment, we encourage you to take your nausea medication as directed.  If you develop nausea and vomiting that is not controlled by your nausea medication, call the clinic.   BELOW ARE SYMPTOMS THAT SHOULD BE REPORTED IMMEDIATELY:  *FEVER GREATER THAN 100.5 F  *CHILLS WITH OR WITHOUT FEVER  NAUSEA AND VOMITING THAT IS NOT CONTROLLED WITH YOUR NAUSEA MEDICATION  *UNUSUAL SHORTNESS OF BREATH  *UNUSUAL BRUISING OR BLEEDING  TENDERNESS IN MOUTH AND THROAT WITH OR WITHOUT PRESENCE OF ULCERS  *URINARY PROBLEMS  *BOWEL PROBLEMS  UNUSUAL RASH Items with * indicate a potential emergency and should be followed up as soon as possible.  Feel free to call the clinic should you have any questions or concerns. The clinic phone number is [phone].  Please show the CHEMO ALERT CARD at check-in to the Emergency Department and triage nurse.   

## 2020-05-10 NOTE — Progress Notes (Signed)
Ok to treat with platelets today per Hinda Lenis, RN per MD Magrinat

## 2020-05-10 NOTE — Telephone Encounter (Signed)
Scheduling Message Entered by DODD, Harlen Labs on 05/10/2020 at 10:56 AM Priority: High INJECTION  Department: [facility]  Provider:   Appointment Notes:  COVID booster shot on 8/26 or 27 this week.    Called pt per schedule message - no answer . Left message for patient to call back to schedule the appt.

## 2020-05-11 LAB — KAPPA/LAMBDA LIGHT CHAINS
Kappa free light chain: 29.2 mg/L — ABNORMAL HIGH (ref 3.3–19.4)
Kappa, lambda light chain ratio: 1.32 (ref 0.26–1.65)
Lambda free light chains: 22.1 mg/L (ref 5.7–26.3)

## 2020-05-11 LAB — PROTEIN ELECTROPHORESIS, SERUM
A/G Ratio: 1.1 (ref 0.7–1.7)
Albumin ELP: 3.4 g/dL (ref 2.9–4.4)
Alpha-1-Globulin: 0.2 g/dL (ref 0.0–0.4)
Alpha-2-Globulin: 0.7 g/dL (ref 0.4–1.0)
Beta Globulin: 1 g/dL (ref 0.7–1.3)
Gamma Globulin: 1.1 g/dL (ref 0.4–1.8)
Globulin, Total: 3.1 g/dL (ref 2.2–3.9)
M-Spike, %: 0.3 g/dL — ABNORMAL HIGH
Total Protein ELP: 6.5 g/dL (ref 6.0–8.5)

## 2020-05-11 LAB — IGG: IgG (Immunoglobin G), Serum: 973 mg/dL (ref 603–1613)

## 2020-05-26 ENCOUNTER — Encounter: Payer: Self-pay | Admitting: Oncology

## 2020-05-31 ENCOUNTER — Other Ambulatory Visit: Payer: Self-pay

## 2020-05-31 ENCOUNTER — Inpatient Hospital Stay: Payer: Medicare Other | Attending: Oncology

## 2020-05-31 ENCOUNTER — Other Ambulatory Visit: Payer: Self-pay | Admitting: Oncology

## 2020-05-31 ENCOUNTER — Inpatient Hospital Stay: Payer: Medicare Other

## 2020-05-31 VITALS — BP 129/64 | HR 71 | Temp 98.2°F | Resp 18

## 2020-05-31 DIAGNOSIS — C9000 Multiple myeloma not having achieved remission: Secondary | ICD-10-CM

## 2020-05-31 DIAGNOSIS — Z9481 Bone marrow transplant status: Secondary | ICD-10-CM

## 2020-05-31 DIAGNOSIS — Z5112 Encounter for antineoplastic immunotherapy: Secondary | ICD-10-CM | POA: Diagnosis not present

## 2020-05-31 DIAGNOSIS — T451X5A Adverse effect of antineoplastic and immunosuppressive drugs, initial encounter: Secondary | ICD-10-CM

## 2020-05-31 DIAGNOSIS — Z79899 Other long term (current) drug therapy: Secondary | ICD-10-CM | POA: Insufficient documentation

## 2020-05-31 DIAGNOSIS — R9089 Other abnormal findings on diagnostic imaging of central nervous system: Secondary | ICD-10-CM

## 2020-05-31 LAB — CMP (CANCER CENTER ONLY)
ALT: 17 U/L (ref 0–44)
AST: 24 U/L (ref 15–41)
Albumin: 3.7 g/dL (ref 3.5–5.0)
Alkaline Phosphatase: 49 U/L (ref 38–126)
Anion gap: 9 (ref 5–15)
BUN: 28 mg/dL — ABNORMAL HIGH (ref 8–23)
CO2: 21 mmol/L — ABNORMAL LOW (ref 22–32)
Calcium: 9.5 mg/dL (ref 8.9–10.3)
Chloride: 103 mmol/L (ref 98–111)
Creatinine: 1.15 mg/dL (ref 0.61–1.24)
GFR, Est AFR Am: >60 mL/min (ref >60)
GFR, Estimated: >60 mL/min (ref >60)
Glucose, Bld: 78 mg/dL (ref 70–99)
Potassium: 4.9 mmol/L (ref 3.5–5.1)
Sodium: 133 mmol/L — ABNORMAL LOW (ref 135–145)
Total Bilirubin: 0.5 mg/dL (ref 0.3–1.2)
Total Protein: 6.9 g/dL (ref 6.5–8.1)

## 2020-05-31 LAB — CBC WITH DIFFERENTIAL (CANCER CENTER ONLY)
Abs Immature Granulocytes: 0.01 10*3/uL (ref 0.00–0.07)
Basophils Absolute: 0 10*3/uL (ref 0.0–0.1)
Basophils Relative: 0 %
Eosinophils Absolute: 0.1 10*3/uL (ref 0.0–0.5)
Eosinophils Relative: 2 %
HCT: 34.2 % — ABNORMAL LOW (ref 39.0–52.0)
Hemoglobin: 11.4 g/dL — ABNORMAL LOW (ref 13.0–17.0)
Immature Granulocytes: 0 %
Lymphocytes Relative: 25 %
Lymphs Abs: 1.3 10*3/uL (ref 0.7–4.0)
MCH: 34 pg (ref 26.0–34.0)
MCHC: 33.3 g/dL (ref 30.0–36.0)
MCV: 102.1 fL — ABNORMAL HIGH (ref 80.0–100.0)
Monocytes Absolute: 0.8 10*3/uL (ref 0.1–1.0)
Monocytes Relative: 15 %
Neutro Abs: 3 10*3/uL (ref 1.7–7.7)
Neutrophils Relative %: 58 %
Platelet Count: 94 10*3/uL — ABNORMAL LOW (ref 150–400)
RBC: 3.35 MIL/uL — ABNORMAL LOW (ref 4.22–5.81)
RDW: 15.2 % (ref 11.5–15.5)
WBC Count: 5.1 10*3/uL (ref 4.0–10.5)
nRBC: 0 % (ref 0.0–0.2)

## 2020-05-31 MED ORDER — PROCHLORPERAZINE MALEATE 10 MG PO TABS: 10.0000 mg | ORAL_TABLET | Freq: Once | ORAL | Status: DC

## 2020-05-31 MED ORDER — BORTEZOMIB CHEMO SQ INJECTION 3.5 MG (2.5MG/ML)
1.3000 mg/m2 | Freq: Once | INTRAMUSCULAR | Status: AC
  Administered 2020-05-31: 2.5 mg via SUBCUTANEOUS
  Filled 2020-05-31: qty 1

## 2020-05-31 NOTE — Progress Notes (Signed)
CMET not resulted at time of arrival of velcade.  Pt requests to wait until next time for zometa.  Pharmacy notified.

## 2020-05-31 NOTE — Patient Instructions (Signed)
[facility] Discharge Instructions for Patients Receiving Chemotherapy  Today you received the following chemotherapy agents Velcade.  To help prevent nausea and vomiting after your treatment, we encourage you to take your nausea medication as directed.  If you develop nausea and vomiting that is not controlled by your nausea medication, call the clinic.   BELOW ARE SYMPTOMS THAT SHOULD BE REPORTED IMMEDIATELY:  *FEVER GREATER THAN 100.5 F  *CHILLS WITH OR WITHOUT FEVER  NAUSEA AND VOMITING THAT IS NOT CONTROLLED WITH YOUR NAUSEA MEDICATION  *UNUSUAL SHORTNESS OF BREATH  *UNUSUAL BRUISING OR BLEEDING  TENDERNESS IN MOUTH AND THROAT WITH OR WITHOUT PRESENCE OF ULCERS  *URINARY PROBLEMS  *BOWEL PROBLEMS  UNUSUAL RASH Items with * indicate a potential emergency and should be followed up as soon as possible.  Feel free to call the clinic should you have any questions or concerns. The clinic phone number is [phone].  Please show the CHEMO ALERT CARD at check-in to the Emergency Department and triage nurse.   

## 2020-05-31 NOTE — Progress Notes (Signed)
OK to treat today with platelet 97 per Dr Lindi Adie

## 2020-06-01 ENCOUNTER — Telehealth: Payer: Self-pay | Admitting: Oncology

## 2020-06-01 ENCOUNTER — Telehealth: Payer: Self-pay | Admitting: *Deleted

## 2020-06-01 LAB — IGG: IgG (Immunoglobin G), Serum: 952 mg/dL (ref 603–1613)

## 2020-06-01 NOTE — Telephone Encounter (Signed)
This RN spoke with pt per his call wanting to follow up on needed appointments.  Note pt has LOS from visit in July - requesting lab and velcade every 21 days thru December- but only scheduled through 9/13.  Pt has been scheduled for MD appt as requested on 10/4 ( which would be a treatment day ).  This RN sent an Urgent appointment request per above for further scheduling.  Note pt was concerned due to " just didn't have any further appointments and thought there was problem "  This RN informed - above was a scheduling oversight- he should be receiving further appointments as MD requested in July.

## 2020-06-01 NOTE — Telephone Encounter (Signed)
Scheduled appt per 9/13 sch msg - left message for patient with appt date and time

## 2020-06-15 ENCOUNTER — Emergency Department (HOSPITAL_COMMUNITY)
Admission: EM | Admit: 2020-06-15 | Discharge: 2020-06-15 | Disposition: A | Discharge status: Home / Self Care | Payer: Medicare Other | Attending: Emergency Medicine | Admitting: Emergency Medicine

## 2020-06-15 ENCOUNTER — Emergency Department (HOSPITAL_COMMUNITY): Payer: Medicare Other

## 2020-06-15 ENCOUNTER — Encounter (HOSPITAL_COMMUNITY): Payer: Self-pay | Admitting: Emergency Medicine

## 2020-06-15 DIAGNOSIS — J45901 Unspecified asthma with (acute) exacerbation: Secondary | ICD-10-CM | POA: Insufficient documentation

## 2020-06-15 DIAGNOSIS — Z87891 Personal history of nicotine dependence: Secondary | ICD-10-CM | POA: Diagnosis not present

## 2020-06-15 DIAGNOSIS — E039 Hypothyroidism, unspecified: Secondary | ICD-10-CM | POA: Diagnosis not present

## 2020-06-15 DIAGNOSIS — S0191XA Laceration without foreign body of unspecified part of head, initial encounter: Secondary | ICD-10-CM | POA: Diagnosis not present

## 2020-06-15 DIAGNOSIS — I10 Essential (primary) hypertension: Secondary | ICD-10-CM | POA: Insufficient documentation

## 2020-06-15 DIAGNOSIS — Z79890 Hormone replacement therapy (postmenopausal): Secondary | ICD-10-CM | POA: Diagnosis not present

## 2020-06-15 DIAGNOSIS — Z79899 Other long term (current) drug therapy: Secondary | ICD-10-CM | POA: Diagnosis not present

## 2020-06-15 DIAGNOSIS — W19XXXA Unspecified fall, initial encounter: Secondary | ICD-10-CM

## 2020-06-15 DIAGNOSIS — W010XXA Fall on same level from slipping, tripping and stumbling without subsequent striking against object, initial encounter: Secondary | ICD-10-CM | POA: Insufficient documentation

## 2020-06-15 DIAGNOSIS — Y92009 Unspecified place in unspecified non-institutional (private) residence as the place of occurrence of the external cause: Secondary | ICD-10-CM | POA: Diagnosis not present

## 2020-06-15 DIAGNOSIS — Y9301 Activity, walking, marching and hiking: Secondary | ICD-10-CM | POA: Insufficient documentation

## 2020-06-15 DIAGNOSIS — S0990XA Unspecified injury of head, initial encounter: Secondary | ICD-10-CM | POA: Diagnosis present

## 2020-06-15 LAB — URINALYSIS, ROUTINE W REFLEX MICROSCOPIC
Bilirubin Urine: NEGATIVE
Glucose, UA: NEGATIVE mg/dL
Hgb urine dipstick: NEGATIVE
Ketones, ur: NEGATIVE mg/dL
Leukocytes,Ua: NEGATIVE
Nitrite: NEGATIVE
Protein, ur: NEGATIVE mg/dL
Specific Gravity, Urine: 1.015 (ref 1.005–1.030)
pH: 5 (ref 5.0–8.0)

## 2020-06-15 LAB — CBC WITH DIFFERENTIAL/PLATELET
Abs Immature Granulocytes: 0.03 10*3/uL (ref 0.00–0.07)
Basophils Absolute: 0 10*3/uL (ref 0.0–0.1)
Basophils Relative: 0 %
Eosinophils Absolute: 0 10*3/uL (ref 0.0–0.5)
Eosinophils Relative: 0 %
HCT: 31.4 % — ABNORMAL LOW (ref 39.0–52.0)
Hemoglobin: 10.6 g/dL — ABNORMAL LOW (ref 13.0–17.0)
Immature Granulocytes: 0 %
Lymphocytes Relative: 13 %
Lymphs Abs: 1 10*3/uL (ref 0.7–4.0)
MCH: 34 pg (ref 26.0–34.0)
MCHC: 33.8 g/dL (ref 30.0–36.0)
MCV: 100.6 fL — ABNORMAL HIGH (ref 80.0–100.0)
Monocytes Absolute: 1 10*3/uL (ref 0.1–1.0)
Monocytes Relative: 13 %
Neutro Abs: 5.7 10*3/uL (ref 1.7–7.7)
Neutrophils Relative %: 74 %
Platelets: 118 10*3/uL — ABNORMAL LOW (ref 150–400)
RBC: 3.12 MIL/uL — ABNORMAL LOW (ref 4.22–5.81)
RDW: 14.9 % (ref 11.5–15.5)
WBC: 7.8 10*3/uL (ref 4.0–10.5)
nRBC: 0 % (ref 0.0–0.2)

## 2020-06-15 LAB — COMPREHENSIVE METABOLIC PANEL
ALT: 18 U/L (ref 0–44)
AST: 27 U/L (ref 15–41)
Albumin: 3.7 g/dL (ref 3.5–5.0)
Alkaline Phosphatase: 39 U/L (ref 38–126)
Anion gap: 8 (ref 5–15)
BUN: 24 mg/dL — ABNORMAL HIGH (ref 8–23)
CO2: 22 mmol/L (ref 22–32)
Calcium: 8.8 mg/dL — ABNORMAL LOW (ref 8.9–10.3)
Chloride: 101 mmol/L (ref 98–111)
Creatinine, Ser: 1.03 mg/dL (ref 0.61–1.24)
GFR calc Af Amer: >60 mL/min (ref >60)
GFR calc non Af Amer: >60 mL/min (ref >60)
Glucose, Bld: 93 mg/dL (ref 70–99)
Potassium: 4.3 mmol/L (ref 3.5–5.1)
Sodium: 131 mmol/L — ABNORMAL LOW (ref 135–145)
Total Bilirubin: 0.3 mg/dL (ref 0.3–1.2)
Total Protein: 6.5 g/dL (ref 6.5–8.1)

## 2020-06-15 MED ORDER — FENTANYL CITRATE (PF) 100 MCG/2ML IJ SOLN
25.0000 ug | Freq: Once | INTRAMUSCULAR | Status: AC
  Administered 2020-06-15: 25 ug via INTRAVENOUS
  Filled 2020-06-15: qty 2

## 2020-06-15 MED ORDER — LIDOCAINE HCL (PF) 1 % IJ SOLN
10.0000 mL | Freq: Once | INTRAMUSCULAR | Status: AC
  Administered 2020-06-15: 10 mL
  Filled 2020-06-15: qty 30

## 2020-06-15 NOTE — Discharge Instructions (Addendum)
Andre [name]! It was great to meet you today! You came with a fall likely due to weakness because of your myeloma. Your labs, urine test and ct scan of your brain were all stable.   I repaired your laceration over the right temple. Please get these stitches removed in 7-10 days by your PCP. They will not dissolve it.  Please only do gentle activity from now on! No intense exercise! Please also make sure you are supervised.  Stay safe and well  Dr Posey Pronto

## 2020-06-15 NOTE — ED Provider Notes (Signed)
[facility] DEPT Provider Note   CSN: [id number] Arrival date & time: 06/15/20  1028     History Chief Complaint  Patient presents with  . Fall    Andre [name] is a 69 y.o. male.  Andre [name] is a 69 yr old male with PMH of multiple myeloma, anemia, arthirtis, ashtma, depression, duodenal ulcer, HLD, GERD, hypothyroidism presents today with a fall.  Patient was jogging outside around 9 AM when he suddenly lost balance and fell.  He cannot remember the mechanism of the fall but does note that he had a head injury and has a minor lac on his right temple. A bystander helped him up and taking him home.  When he got home his wife was concerned about him so called EMS.  Denies chest pain, dyspnea, palpitations prior to the fall but did feel little dizzy.  Now has a dull, frontal headache. Denies vision changes, vomiting, fevers or urinary symptoms.  Has been constipated causing rectal bleeding recently.   Denies EtOH intake or illicit drug use.  Patient has had both Covid vaccines. Tetanus shot in 2018.        Past Medical History:  Diagnosis Date  . Allergy   . Anemia   . Arthritis   . Asthma    no treatment x 20 years  . Depression   . Double vision    occurs at times   . Duodenal ulcer   . GERD (gastroesophageal reflux disease)   . Hyperlipidemia   . Hypertension   . Hypothyroidism   . Multiple myeloma 07/04/2011  . Thyroid disease     Patient Active Problem List   Diagnosis Date Noted  . Hemorrhoids 11/20/2019  . Neuropathy due to chemotherapeutic drug ([place]) 09/01/2019  . Leukopenia due to antineoplastic chemotherapy ([place]) 02/22/2017  . Dry skin dermatitis 08/16/2016  . Peripheral edema 04/26/2016  . 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 Debility 02/26/2016  . Back pain 02/26/2016  . Febrile neutropenia ([place]) 02/15/2016  . Patient in clinical research study 01/10/2016  . Rash and nonspecific skin eruption 01/06/2016    . HCAP (healthcare-associated pneumonia)   . GERD (gastroesophageal reflux disease)   . Dehydration 12/22/2015  . Weakness 12/22/2015  . Asthma exacerbation 12/22/2015  . Hyponatremia 12/22/2015  . General weakness   . Multiple myeloma not having achieved remission ([place])   . Sepsis ([place])   . Chronic anemia   . SBO (small bowel obstruction) ([place]) 11/07/2015  . Constipation   . Actinic keratosis 07/08/2015  . Transient global amnesia 06/07/2015  . Diarrhea 09/17/2014  . Raynaud's phenomenon 09/17/2013  . Hemorrhage of rectum and anus 09/15/2013  . Skin lesion, superficial 07/29/2013  . Cough 01/10/2013  . Abnormal brain MRI 10/08/2012  . Vitamin D deficiency 09/30/2012  . Hypothyroidism 09/30/2012  . Hypogonadism male 09/30/2012  . Fatigue 09/27/2012  . Insomnia 09/27/2012  . Reactive depression (situational) 09/27/2012  . Well adult exam 09/27/2012  . Decreased energy 04/18/2012  . TIA (transient ischemic attack) 03/27/2012  . Transient diplopia 03/27/2012  . HYPERLIPIDEMIA 05/30/2010  . ANEMIA 05/30/2010  . Asthma 05/30/2010  . SCHATZKI'S RING 05/30/2010  . Peptic ulcer disease 05/30/2010  . HIATAL HERNIA 05/30/2010  . SLEEP APNEA, MILD 05/30/2010  . TRANSAMINASES, SERUM, ELEVATED 05/30/2010    Past Surgical History:  Procedure Laterality Date  . BONE MARROW TRANSPLANT  2011   for MM  . CARDIOLITE STUDY  11/25/2003   NORMAL  .  TONSILLECTOMY         Family History  Problem Relation Age of Onset  . Throat cancer Mother   . Hypertension Father   . Stroke Father   . Asthma Father   . Diabetes Father   . Pancreatic cancer Brother     Social History   Tobacco Use  . Smoking status: Former Smoker    Years: 3.00    Quit date: 03/27/1969    Years since quitting: 51.2  . Smokeless tobacco: Never Used  Substance Use Topics  . Alcohol use: No  . Drug use: No    Home Medications Prior to Admission medications   Medication Sig Start Date End Date Taking?  Authorizing Provider  calcium-vitamin D (OSCAL WITH D) 500-200 MG-UNIT tablet Take 1 tablet by mouth daily.    [provider]  cetirizine (ZYRTEC) 10 MG tablet Take 10 mg by mouth daily.    [provider]  Cholecalciferol (VITAMIN D3) 2000 units capsule Take 1 capsule (2,000 Units total) by mouth daily. 08/17/17   [name], Evie Lacks, MD  docusate sodium (COLACE) 100 MG capsule Take 100 mg by mouth daily as needed for mild constipation or moderate constipation.    [provider]  escitalopram (LEXAPRO) 10 MG tablet Take 1 tablet (10 mg total) by mouth daily. 02/23/20   [name], Evie Lacks, MD  HYDROCORTISONE ACE, RECTAL, 30 MG SUPP 1 pr bid x 7 d prn 11/20/19   [name], Evie Lacks, MD  levothyroxine (SYNTHROID) 150 MCG tablet TAKE 1 TABLET BY MOUTH  EVERY MORNING ON AN EMPTY  STOMACH 12/15/19   Magrinat, [name], MD  ondansetron (ZOFRAN) 8 MG tablet Take 1 tablet (8 mg total) by mouth every 8 (eight) hours as needed for nausea or vomiting. 09/02/19   Magrinat, [name], MD  pantoprazole (PROTONIX) 40 MG tablet TAKE 1 TABLET BY MOUTH  TWICE A DAY BEFORE MEALS 02/23/20   Magrinat, [name], MD  triamcinolone ointment (KENALOG) 0.5 % Apply 1 application topically 2 (two) times daily. 12/16/19   Magrinat, [name], MD  valACYclovir (VALTREX) 1000 MG tablet Take 1 tablet (1,000 mg total) by mouth daily. 11/20/19   [name], Evie Lacks, MD  zolendronic acid (ZOMETA) 4 MG/5ML injection Inject 4 mg into the vein every 3 (three) months.    [provider]    Allergies    Atorvastatin, Rosuvastatin, Crestor [rosuvastatin calcium], Lipitor [atorvastatin calcium], and Septra [sulfamethoxazole-trimethoprim]  Review of Systems   Review of Systems  Constitutional: Negative for chills and fever.  Respiratory: Negative for cough, chest tightness, shortness of breath and wheezing.   Gastrointestinal: Positive for anal bleeding and constipation. Negative for abdominal pain,  diarrhea, nausea and vomiting.  Genitourinary: Negative for dysuria, frequency and hematuria.  Musculoskeletal: Negative for joint swelling, neck pain and neck stiffness.  Skin: Negative for pallor.  Neurological: Positive for dizziness, light-headedness and headaches. Negative for seizures.  Psychiatric/Behavioral: Positive for agitation.    Physical Exam Updated Vital Signs BP (!) 156/124   Pulse 69   Temp 98.1 F (36.7 C) (Oral)   Resp 16   Ht 5' 9.5" (1.765 m)   Wt 77.1 kg   SpO2 100%   BMI 24.74 kg/m   Physical Exam Constitutional:      General: He is in acute distress (moderate 2/2 pain).     Appearance: He is well-developed. He is not ill-appearing.    HENT:     Head: Normocephalic.  Mouth/Throat:     Mouth: Mucous membranes are dry.  Eyes:     Extraocular Movements: Extraocular movements intact.     Conjunctiva/sclera: Conjunctivae normal.     Pupils: Pupils are equal, round, and reactive to light.  Cardiovascular:     Rate and Rhythm: Normal rate and regular rhythm.     Pulses: Normal pulses.     Heart sounds: Normal heart sounds, S1 normal and S2 normal.  Pulmonary:     Effort: Pulmonary effort is normal.     Breath sounds: Normal breath sounds.  Abdominal:     General: Abdomen is flat. Bowel sounds are normal.     Palpations: Abdomen is soft.  Musculoskeletal:     Cervical back: Normal range of motion and neck supple.  Skin:    General: Skin is warm and dry.  Neurological:     General: No focal deficit present.     Mental Status: He is alert.     ED Results / Procedures / Treatments   Labs (all labs ordered are listed, but only abnormal results are displayed) Labs Reviewed - No data to display  EKG None  Radiology No results found.  Procedures .Marland KitchenLaceration Repair  Date/Time: 06/15/2020 2:13 PM Performed by: Lattie Haw, MD Authorized by: Margette Fast, MD   Consent:    Consent obtained:  Verbal   Consent given by:   Patient Anesthesia (see MAR for exact dosages):    Anesthesia method:  Local infiltration   Local anesthetic:  Lidocaine 1% w/o epi Laceration details:    Location:  Face   Face location:  Forehead   Length (cm):  2   Depth (mm):  4 Repair type:    Repair type:  Simple Pre-procedure details:    Preparation:  Patient was prepped and draped in usual sterile fashion Exploration:    Hemostasis achieved with:  Direct pressure Treatment:    Area cleansed with:  Betadine and saline   Amount of cleaning:  Standard   Irrigation solution:  Sterile water Skin repair:    Repair method:  Sutures   Suture size:  5-0   Suture material:  Prolene Approximation:    Approximation:  Close Post-procedure details:    Dressing:  Open (no dressing)   Patient tolerance of procedure:  Tolerated well, no immediate complications   (including critical care time)  Medications Ordered in ED Medications - No data to display  ED Course  I have reviewed the triage vital signs and the nursing notes.  Pertinent labs & imaging results that were available during my care of the patient were reviewed by me and considered in my medical decision making (see chart for details).    MDM Rules/Calculators/A&P                          Andre [name] is a 69 yr old male with PMH of multiple myeloma, anemia, arthirtis, ashtma, depression, duodenal ulcer, HLD, GERD, hypothyroidism presents today with a fall. On exam pt has a 2 cm minor lac on right temple. Repaired with 5.0 prolene, see procedure note. Tenderness over right 6th and 7th ribs. Bruising bilaterally over knees, normal active and passive ROM of both knees bilaterally. No c-spine tenderness. Labs: Na 131, K 4.3, Cr 2, Calcium 8.8, LFTs wnl, WBC 7.8, Hb 10.6 and platelets 118. UA negative. CT head and spine: negative for acute intracranial bleed or fractures. Bps [phone].  No dizziness on standing. Fall  likely 2/2 generalized weakness because of patient's  multiple myeloma.  Discharged patient home with strict return precautions. Recommended against unsupervised exercises and recommended gentle exercises only such as gentle walking etc.. Patient and wife expressed understanding.   Final Clinical Impression(s) / ED Diagnoses Final diagnoses:  None    Rx / DC Orders ED Discharge Orders    None       Lattie Haw, MD 06/15/20 1459    Margette Fast, MD 06/16/20 [phone]

## 2020-06-15 NOTE — ED Notes (Signed)
Pt stood at bedside for orthostatic vitals and did well. Pt assisted back to bed. Pt was also assisted to use the urinal.

## 2020-06-15 NOTE — ED Notes (Signed)
Pt placed on primofit at 22mmHg.

## 2020-06-15 NOTE — ED Triage Notes (Addendum)
Per EMS-coming from home-fell while walking-doesn't remember falling-laceration to right eyebrow, [place]/L knees-right flank/rib cage pain-no blood thinners-doesn't know if he lost consciousness

## 2020-06-15 NOTE — ED Notes (Signed)
Suture cart placed by the door.

## 2020-06-20 NOTE — Progress Notes (Signed)
Andre [name]  [phone] [phone]    ID: Andre [name]  DOB: [birth year]  MR#: [id number] CSN#: [id number]   Patient Care Team: Cassandria Anger, MD as PCP - General (Internal Medicine) Aleighna Wojtas, [name], MD as Consulting Physician (Oncology) Concepcion Living, MD as Consulting Physician (Neurology) [name], MD as Consulting Physician (Internal Medicine) Kathrynn Ducking, MD as Consulting Physician (Neurology) Dorna Bloom, DDS as Referring Physician (Oral Surgery) OTHERS: Augustina Mood DDS   CHIEF COMPLAINT: Multiple myeloma  CURRENT TREATMENT: bortezomib every 21 days, zoledronate every 12 weeks   INTERVAL HISTORY:  Andre [name] returns today for follow-up and treatment of his multiple myeloma.   He continues on bortezomib.  We are now doing this every 3 weeks and he tells me he generally feels much better in the third week.  He also continues on zolendronate, which was last received on 03/08/2020.   Since his last visit, he underwent metastatic bone survey on 04/05/2020 showing: relatively stable lytic lesions throughout calvarium, within bilateral humeri and femurs, and within left radius.  He presented to the ED on 06/15/2020 following a fall. He underwent right rib x-ray, head CT, and cervical spine CT at that time. All were negative for acute abnormalities.   REVIEW OF SYSTEMS: Andre [name] was walking/jogging in his neighborhood, not in an area where there was uneven pavement or other complications.  He is simply not sure why he fell.  He thinks he has a balance problem and of course this is not new.  He lacerated his face just lateral to the right orbit and had 3 stitches for that.  He also bumped his right ribs pretty badly and they still hurt some.  He has no problems breathing, no cough, no pleurisy.  A detailed review of systems today was otherwise stable.   HISTORY OF PRESENT ILLNESS:  From the original intake  nodes:  Andre [name] was worked up for peptic ulcer disease in August of 2011, with significant bleeding and anemia.  The patient was Helicobacter pylori negative.  He received some epinephrine when he had his EGD and then started on Protonix.  [name] anemia slowly resolved so that by September, his hemoglobin was up to 10.9 and by earlier this month, his hemoglobin was up to 12.5.    As part of his general workup, he was found to have a slightly elevated globulin fraction.  In September, his total protein was 8.3 with an albumin of 3.8.  In January, the total protein was 8.4 and albumin 3.6.  With persistence of this slight abnormality, Dr. Olevia Perches obtained serum immunofixation and SPEP.  The SPTP showed an M-spike of 2.67 grams.  A total IgG was 4,190.  Total IgA low at 28.  Total IgM low at 28 also.  The immunofixation showed a monoclonal IgG lambda paraprotein.  There were also monoclonal free lambda light chains present. With this information, the patient was referred for further evaluation.   A diagnosis of myeloma was confirmed by bone marrow biopsy and subsequebnt treatment is as detailed below   PAST MEDICAL HISTORY: Past Medical History:  Diagnosis Date  . Allergy   . Anemia   . Arthritis   . Asthma    no treatment x 20 years  . Depression   . Double vision    occurs at times   . Duodenal ulcer   . GERD (gastroesophageal reflux disease)   . Hyperlipidemia   . Hypertension   .  Hypothyroidism   . Multiple myeloma 07/04/2011  . Thyroid disease    Significant for peptic ulcer disease as noted above. History of anxiety and depression.  History of GERD which is significantly improved with weight loss and history of reactive airway disease, possibly secondary to the GERD which also improved with weight loss.  There was a history of obesity, now much improved secondary to exercise and diet.    PAST SURGICAL HISTORY: Past Surgical History:  Procedure Laterality Date  .  BONE MARROW TRANSPLANT  2011   for MM  . CARDIOLITE STUDY  11/25/2003   NORMAL  . TONSILLECTOMY      FAMILY HISTORY: Family History  Problem Relation Age of Onset  . Throat cancer Mother   . Hypertension Father   . Stroke Father   . Asthma Father   . Diabetes Father   . Pancreatic cancer Brother    [name] father died at the age of 32 following a stroke.  [name] mother died with cancer of the throat at the age of 50.   She had a history of depression and was a smoker.  Not clear how much alcohol she drank, according to the patient.  The patient had two brothers.  One died from the age of 74 from a "kidney problem."  The second one died at the age of 71 from pancreatic cancer. (This may have been duodenal cancer. 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  His wife of 37+ years was [name], but they divorced. In 2016 he remarried, his current wife's name is [name].  The patient has two daughters:  Andre [name], who works in [place], who had her second child April 2016. Both daughters live here in [place].  [place] first daughter, Andre [name], born 03/06/2013, apparently has [employer]/[place]/Rendu. The patient is not a church attender.  Andre [name] also participates in [place] even though actually there is no alcohol or drug problem in the family.     ADVANCED DIRECTIVES: In place   HEALTH MAINTENANCE:  Social History   Tobacco Use  . Smoking status: Former Smoker    Years: 3.00    Quit date: 03/27/1969    Years since quitting: 51.2  . Smokeless tobacco: Never Used  Substance Use Topics  . Alcohol use: No  . Drug use: No    Colonoscopy: January 2015  PSA: December 2018, normal/Dr. [name]  Lipid panel: per Dr. Alain Marion  Allergies  Allergen Reactions  . Atorvastatin Other (See  Comments)  . Rosuvastatin     Other reaction(s): Liver Disorder  . Crestor [Rosuvastatin Calcium]     ADVERSE EFFECTS ON LIVER  . Lipitor [Atorvastatin Calcium]   . Septra [Sulfamethoxazole-Trimethoprim] Rash   Current Outpatient Medications  Medication Sig Dispense Refill  . calcium-vitamin D (OSCAL WITH D) 500-200 MG-UNIT tablet Take 1 tablet by mouth daily.    . cetirizine (ZYRTEC) 10 MG tablet Take 10 mg by mouth daily.    . Cholecalciferol (VITAMIN D3) 2000 units capsule Take 1 capsule (2,000 Units total) by mouth daily. 100 capsule 3  . docusate sodium (COLACE) 100 MG capsule Take 100 mg by mouth daily as needed for mild constipation or moderate constipation.    [name] escitalopram (LEXAPRO) 10 MG tablet Take 1 tablet (10 mg total) by mouth daily.  90 tablet 1  . HYDROCORTISONE ACE, RECTAL, 30 MG SUPP 1 pr bid x 7 d prn 14 suppository 3  . levothyroxine (SYNTHROID) 150 MCG tablet TAKE 1 TABLET BY MOUTH  EVERY MORNING ON AN EMPTY  STOMACH 90 tablet 3  . ondansetron (ZOFRAN) 8 MG tablet Take 1 tablet (8 mg total) by mouth every 8 (eight) hours as needed for nausea or vomiting. 20 tablet 3  . pantoprazole (PROTONIX) 40 MG tablet TAKE 1 TABLET BY MOUTH  TWICE A DAY BEFORE MEALS 180 tablet 3  . triamcinolone ointment (KENALOG) 0.5 % Apply 1 application topically 2 (two) times daily. 30 g 3  . valACYclovir (VALTREX) 1000 MG tablet Take 1 tablet (1,000 mg total) by mouth daily. 90 tablet 3  . zolendronic acid (ZOMETA) 4 MG/5ML injection Inject 4 mg into the vein every 3 (three) months.     No current facility-administered medications for this visit.   Facility-Administered Medications Ordered in Other Visits  Medication Dose Route Frequency Provider Last Rate Last Admin  . prochlorperazine (COMPAZINE) tablet 10 mg  10 mg Oral Once Deajah Erkkila, [name], MD       Objective: white man who appears stated age 89:   06/21/20 1304  BP: 112/62  Pulse: 67  Resp: 20  Temp: (!) 97.2 F (36.2 C)   SpO2: 100%   Wt Readings from Last 3 Encounters:  06/21/20 177 lb 6.4 oz (80.5 kg)  06/15/20 170 lb (77.1 kg)  05/10/20 178 lb 8 oz (81 kg)   Body mass index is 25.82 kg/m.    ECOG FS:1 - Symptomatic but completely ambulatory  Sclerae unicteric, EOMs intact, pupils round and equal Wearing a mask No cervical or supraclavicular adenopathy Lungs no rales or rhonchi Heart regular rate and rhythm Abd soft, nontender, positive bowel sounds MSK no focal spinal tenderness, no upper extremity lymphedema Neuro: nonfocal, well oriented, appropriate affect  LAB RESULTS:  No visits with results within 1 Day(s) from this visit.  Latest known visit with results is:  Admission on 06/15/2020, Discharged on 06/15/2020  Component Date Value Ref Range Status  . Color, Urine 06/15/2020 YELLOW  YELLOW Final  . APPearance 06/15/2020 CLEAR  CLEAR Final  . Specific Gravity, Urine 06/15/2020 1.015  1.005 - 1.030 Final  . pH 06/15/2020 5.0  5.0 - 8.0 Final  . Glucose, UA 06/15/2020 NEGATIVE  NEGATIVE mg/dL Final  . Hgb urine dipstick 06/15/2020 NEGATIVE  NEGATIVE Final  . Bilirubin Urine 06/15/2020 NEGATIVE  NEGATIVE Final  . Ketones, ur 06/15/2020 NEGATIVE  NEGATIVE mg/dL Final  . Protein, ur 06/15/2020 NEGATIVE  NEGATIVE mg/dL Final  . Nitrite 06/15/2020 NEGATIVE  NEGATIVE Final  . Chalmers Guest 06/15/2020 NEGATIVE  NEGATIVE Final   Performed at [facility] [address] [place], [place] [address]  . WBC 06/15/2020 7.8  4.0 - 10.5 K/uL Final  . RBC 06/15/2020 3.12* 4.22 - 5.81 MIL/uL Final  . Hemoglobin 06/15/2020 10.6* 13.0 - 17.0 g/dL Final  . HCT 06/15/2020 31.4* 39 - 52 % Final  . MCV 06/15/2020 100.6* 80.0 - 100.0 fL Final  . MCH 06/15/2020 34.0  26.0 - 34.0 pg Final  . MCHC 06/15/2020 33.8  30.0 - 36.0 g/dL Final  . RDW 06/15/2020 14.9  11.5 - 15.5 % Final  . Platelets 06/15/2020 118* 150 - 400 K/uL Final   Comment: REPEATED TO VERIFY PLATELET COUNT CONFIRMED BY  SMEAR SPECIMEN CHECKED FOR CLOTS Immature Platelet Fraction may be clinically indicated, consider ordering  this additional test [id number]   . nRBC 06/15/2020 0.0  0.0 - 0.2 % Final  . Neutrophils Relative % 06/15/2020 74  % Final  . Neutro Abs 06/15/2020 5.7  1.7 - 7.7 K/uL Final  . Lymphocytes Relative 06/15/2020 13  % Final  . Lymphs Abs 06/15/2020 1.0  0.7 - 4.0 K/uL Final  . Monocytes Relative 06/15/2020 13  % Final  . Monocytes Absolute 06/15/2020 1.0  0 - 1 K/uL Final  . Eosinophils Relative 06/15/2020 0  % Final  . Eosinophils Absolute 06/15/2020 0.0  0 - 0 K/uL Final  . Basophils Relative 06/15/2020 0  % Final  . Basophils Absolute 06/15/2020 0.0  0 - 0 K/uL Final  . Immature Granulocytes 06/15/2020 0  % Final  . Abs Immature Granulocytes 06/15/2020 0.03  0.00 - 0.07 K/uL Final   Performed at [facility], [place] [address] [place], [place] [address]  . Sodium 06/15/2020 131* 135 - 145 mmol/L Final  . Potassium 06/15/2020 4.3  3.5 - 5.1 mmol/L Final  . Chloride 06/15/2020 101  98 - 111 mmol/L Final  . CO2 06/15/2020 22  22 - 32 mmol/L Final  . Glucose, Bld 06/15/2020 93  70 - 99 mg/dL Final   Glucose reference range applies only to samples taken after fasting for at least 8 hours.  . BUN 06/15/2020 24* 8 - 23 mg/dL Final  . Creatinine, Ser 06/15/2020 1.03  0.61 - 1.24 mg/dL Final  . Calcium 06/15/2020 8.8* 8.9 - 10.3 mg/dL Final  . Total Protein 06/15/2020 6.5  6.5 - 8.1 g/dL Final  . Albumin 06/15/2020 3.7  3.5 - 5.0 g/dL Final  . AST 06/15/2020 27  15 - 41 U/L Final  . ALT 06/15/2020 18  0 - 44 U/L Final  . Alkaline Phosphatase 06/15/2020 39  38 - 126 U/L Final  . Total Bilirubin 06/15/2020 0.3  0.3 - 1.2 mg/dL Final  . GFR calc non Af Amer 06/15/2020 >60  >60 mL/min Final  . GFR calc Af Amer 06/15/2020 >60  >60 mL/min Final  . Anion gap 06/15/2020 8  5 - 15 Final   Performed at [facility], [place] [address], [place], [place]  [address]    STUDIES: DG Ribs Unilateral W/Chest Right  Result Date: 06/15/2020 CLINICAL DATA:  Right rib pain after a fall. EXAM: RIGHT RIBS AND CHEST - 3+ VIEW COMPARISON:  Chest radiographs 05/23/2018.  Bone survey 04/05/2020. FINDINGS: The cardiomediastinal silhouette is within normal limits. Lung volumes are slightly lower than on the prior chest radiographs. No confluent airspace opacity, edema, pleural effusion, pneumothorax is identified. There are old right fourth through eighth rib fractures. No definite acute fracture is identified. IMPRESSION: Multiple old right rib fractures without a definite acute fracture identified. Electronically Signed   By: Logan Bores M.D.   On: 06/15/2020 12:18   CT Head Wo Contrast  Result Date: 06/15/2020 CLINICAL DATA:  Head trauma, moderate/severe. Neck trauma. Additional history provided: Fall, laceration to right eyebrow. EXAM: CT HEAD WITHOUT CONTRAST CT CERVICAL SPINE WITHOUT CONTRAST TECHNIQUE: Multidetector CT imaging of the head and cervical spine was performed following the standard protocol without intravenous contrast. Multiplanar CT image reconstructions of the cervical spine were also generated. COMPARISON:  Head CT 05/23/2018. cervical spine MRI 10/01/2012. FINDINGS: CT HEAD FINDINGS Brain: Stable, mild generalized cerebral atrophy. Redemonstrated small remote infarct within the left cerebellum. There is no acute intracranial hemorrhage. No demarcated cortical infarct. No extra-axial fluid collection.  No evidence of intracranial mass. No midline shift. Vascular: No hyperdense vessel.  Atherosclerotic calcifications. Skull: No calvarial fracture. As before, there are innumerable lytic foci within the calvarium consistent with the known history of multiple myeloma. Sinuses/Orbits: Visualized orbits show no acute finding. Mild ethmoid sinus mucosal thickening. No significant mastoid effusion. CT CERVICAL SPINE FINDINGS Alignment: Mild cervical  dextrocurvature. Straightening of the expected cervical lordosis. No significant spondylolisthesis. Skull base and vertebrae: The basion-dental and atlanto-dental intervals are maintained.No evidence of acute fracture to the cervical spine. There are a few small scattered lytic foci within the cervical spine which may reflect myeloma lesions (for instance a subcentimeter lesion within the C2 spinous process (series 11, image 32). Soft tissues and spinal canal: No prevertebral fluid or swelling. No visible canal hematoma. Disc levels: Cervical spondylosis with multilevel disc space narrowing, disc bulges, uncovertebral and facet hypertrophy. C5-C6 posterior disc osteophyte complex. No high-grade bony spinal canal stenosis. Multilevel neural foraminal narrowing greatest on the left at C5-C6. Of note, disc space narrowing is advanced at C5-C6 and C6-C7. Upper chest: No consolidation within the imaged lung apices. No visible pneumothorax. IMPRESSION: CT head: 1. No evidence of acute intracranial abnormality. 2. Stable, mild generalized cerebral atrophy. 3. Redemonstrated small remote infarct within the left cerebellar hemisphere. 4. As before, there are innumerable small lytic foci within the calvarium consistent with the known history of multiple myeloma. 5. Mild ethmoid sinus mucosal thickening. CT cervical spine: 1. No evidence of acute fracture to the cervical spine. 2. Cervical spondylosis as described and greatest at C5-C6 and C6-C7. 3. There are a few small scattered lytic foci within the cervical spine which may reflect myeloma lesions. Electronically Signed   By: Kellie Simmering DO   On: 06/15/2020 14:16   CT Cervical Spine Wo Contrast  Result Date: 06/15/2020 CLINICAL DATA:  Head trauma, moderate/severe. Neck trauma. Additional history provided: Fall, laceration to right eyebrow. EXAM: CT HEAD WITHOUT CONTRAST CT CERVICAL SPINE WITHOUT CONTRAST TECHNIQUE: Multidetector CT imaging of the head and cervical  spine was performed following the standard protocol without intravenous contrast. Multiplanar CT image reconstructions of the cervical spine were also generated. COMPARISON:  Head CT 05/23/2018. cervical spine MRI 10/01/2012. FINDINGS: CT HEAD FINDINGS Brain: Stable, mild generalized cerebral atrophy. Redemonstrated small remote infarct within the left cerebellum. There is no acute intracranial hemorrhage. No demarcated cortical infarct. No extra-axial fluid collection. No evidence of intracranial mass. No midline shift. Vascular: No hyperdense vessel.  Atherosclerotic calcifications. Skull: No calvarial fracture. As before, there are innumerable lytic foci within the calvarium consistent with the known history of multiple myeloma. Sinuses/Orbits: Visualized orbits show no acute finding. Mild ethmoid sinus mucosal thickening. No significant mastoid effusion. CT CERVICAL SPINE FINDINGS Alignment: Mild cervical dextrocurvature. Straightening of the expected cervical lordosis. No significant spondylolisthesis. Skull base and vertebrae: The basion-dental and atlanto-dental intervals are maintained.No evidence of acute fracture to the cervical spine. There are a few small scattered lytic foci within the cervical spine which may reflect myeloma lesions (for instance a subcentimeter lesion within the C2 spinous process (series 11, image 32). Soft tissues and spinal canal: No prevertebral fluid or swelling. No visible canal hematoma. Disc levels: Cervical spondylosis with multilevel disc space narrowing, disc bulges, uncovertebral and facet hypertrophy. C5-C6 posterior disc osteophyte complex. No high-grade bony spinal canal stenosis. Multilevel neural foraminal narrowing greatest on the left at C5-C6. Of note, disc space narrowing is advanced at C5-C6 and C6-C7. Upper chest: No consolidation within the imaged lung  apices. No visible pneumothorax. IMPRESSION: CT head: 1. No evidence of acute intracranial abnormality. 2.  Stable, mild generalized cerebral atrophy. 3. Redemonstrated small remote infarct within the left cerebellar hemisphere. 4. As before, there are innumerable small lytic foci within the calvarium consistent with the known history of multiple myeloma. 5. Mild ethmoid sinus mucosal thickening. CT cervical spine: 1. No evidence of acute fracture to the cervical spine. 2. Cervical spondylosis as described and greatest at C5-C6 and C6-C7. 3. There are a few small scattered lytic foci within the cervical spine which may reflect myeloma lesions. Electronically Signed   By: Kellie Simmering DO   On: 06/15/2020 14:16      ASSESSMENT: 69 y.o. [place] man with a history of multiple myeloma diagnosed February of 2012, with an initial M-spike of 2.6 g/dL, IFE showing an IgG lambda paraprotein and free lambda light chains in the urine. Cytogenetics showed trisomy 14. Bone marrow biopsy showed a 22% plasmacytosis. Treated with   (1) bortezomib (subcutaneously), lenalidomide, and dexamethasone, with repeat bone marrow biopsy May of 2012 showing 10% plasmacytosis   (2) high-dose chemotherapy with BCNU and melphalan at [facility], followed by stem cell rescue July of 2012   (3) on zoledronic acid started December of 2012, initially monthly, currently given every 3 months, most recent dose  12/07/2015  (4) low-dose lenalidomide resumed April 2013, interrupted several times.  Resumed again on 02/19/2013, ata dose of 5 mg daily, 21 days on and 7 days off, later further reduced to 7 days on, 7 days off  (5) CNS symptoms and abnormal brain MRI extensively evaluated by neurology with no definitive diagnosis established  (6) rising M spike noted June 2014 but did not meet criteria for carfilzomib study  (7) on lenalidomide 25 mg daily, 14 days on, 7 days off, starting 04/18/2013, interrupted December 2014 because of rash;   (a) resumed January 2015 at 10 mg/ day at 21 days on/ 7 days off  (Josean) starting 08/18/2014 decreased to 10  mg/ day 14 days on, 7 days off because of cytopenias  (c) as of February 2016 was on 5 mg daily 7 days on 7 days off  (d) lenalidomide discontinued December 2016 with evidence of disease progression  (8) transient global amnesia 05/29/2015, resolved without intervention  (9) starting PVD 10/25/2015 w ASA 325 thromboprophylaxis, valacyclovir prophylaxis, last dose 12/17/2015  (a) pomalidomide 4 mg/d days 1-14  (Elion) bortezomib sQ days 2,5,9,12 of each 21 day cycle  (c) dexamethasome 20 mg two days a week  (d) dexamethasone bortezomib and pomalidomide discontinued late December 2018 with poor tolerance  (10) metapneumovirus pneumonia April 2017  (a) completing course of steroids and week of bactrim mid April 2017  (11) status post second autologous transplant at [facility] 02/04/2016(preparatory regimen melphalan 200 mg/m)  (a) received twelve-month vaccinations 03/14/2017 (DPT, Haemophilus, Pneumovax 13, polio)  (Breeze) 14 months injections 05/04/2016 include DTaP, Hib conjugate, HepB energex Artis 20 mcg/ml, Prevnar 13  (c) 24 month vaccines due at [facility] June 2019  (12) maintenance therapy started November 2017, consisting of  (a) bortezomib 1.3 mg/M2 every 14 days, first dose 07/27/2016  (Dailey) pomalidomide 1 mg days 1-21 Q28 days, started 07/19/2016  (c) zolendronate monthly started 07/27/2016 (previously Q 3 months) however patient unable to tolerate, and changed back to q3 months in April, 2018   (d) Bortezomib changed to monthly as of June 2018 because of tolerance issues, however discontinued after 09/14/2017 dose because of a rise in his M spike  (  e) pomalidomide held after 10/18/2017 in preparation for possible study at [facility] (venetoclax)--never resumed  (f) with numbers actually improved off treatment, resumed every 2-week bortezomib 12/25/2017  (g) changed to every 3-week bortezomib as of 03/17/2019   PLAN:  Andre [name] will soon be 10 years out from his initial diagnosis of multiple myeloma.  His  disease continues to be very well controlled and he is tolerating treatment generally well.  Accordingly we are continuing with every 3-week bortezomib.  Dr. Alvie Heidelberg at [facility] as suggested since we have broadened the treatment interval that we monitor more closely so we are going to be doing SPEP's and kappa lambda chains every 21days.  That way we can tighten treatment if there is any evidence of disease progression  We discussed falls issues and balance problems.  He has had a very thorough.neurologic evaluation previously.  I think what he would benefit from at this point is physical therapy and I have placed that order in for him.  Otherwise he will receive zoledronate today and again in 12 weeks.  He will see me with the next zoledronate dose.  He knows to call for any other problem that may develop before the next visit.  Total encounter time 30 minutes.   Harlem Bula, [name], MD  06/21/20 5:43 PM Medical Oncology and Hematology [facility] [place], [place] [address] Tel. [phone]    Fax. [phone]    I, Wilburn Mylar, am acting as scribe for Dr. [name]. Nazaret Chea.  I, Lurline Del MD, have reviewed the above documentation for accuracy and completeness, and I agree with the above.    *Total Encounter Time as defined by the Centers for Medicare and Medicaid Services includes, in addition to the face-to-face time of a patient visit (documented in the note above) non-face-to-face time: obtaining and reviewing outside history, ordering and reviewing medications, tests or procedures, care coordination (communications with other health care professionals or caregivers) and documentation in the medical record.

## 2020-06-21 ENCOUNTER — Inpatient Hospital Stay: Payer: Medicare Other | Attending: Oncology | Admitting: Oncology

## 2020-06-21 ENCOUNTER — Inpatient Hospital Stay: Payer: Medicare Other

## 2020-06-21 ENCOUNTER — Other Ambulatory Visit: Payer: Self-pay | Admitting: Oncology

## 2020-06-21 ENCOUNTER — Other Ambulatory Visit: Payer: Self-pay

## 2020-06-21 VITALS — BP 112/62 | HR 67 | Temp 97.2°F | Resp 20 | Ht 69.5 in | Wt 177.4 lb

## 2020-06-21 DIAGNOSIS — C9000 Multiple myeloma not having achieved remission: Secondary | ICD-10-CM | POA: Insufficient documentation

## 2020-06-21 DIAGNOSIS — Z9481 Bone marrow transplant status: Secondary | ICD-10-CM

## 2020-06-21 DIAGNOSIS — Z5112 Encounter for antineoplastic immunotherapy: Secondary | ICD-10-CM | POA: Insufficient documentation

## 2020-06-21 DIAGNOSIS — Z79899 Other long term (current) drug therapy: Secondary | ICD-10-CM | POA: Insufficient documentation

## 2020-06-21 DIAGNOSIS — E039 Hypothyroidism, unspecified: Secondary | ICD-10-CM | POA: Insufficient documentation

## 2020-06-21 DIAGNOSIS — R9089 Other abnormal findings on diagnostic imaging of central nervous system: Secondary | ICD-10-CM

## 2020-06-21 DIAGNOSIS — T451X5A Adverse effect of antineoplastic and immunosuppressive drugs, initial encounter: Secondary | ICD-10-CM

## 2020-06-21 MED ORDER — ZOLEDRONIC ACID 4 MG/100ML IV SOLN
4.0000 mg | Freq: Once | INTRAVENOUS | Status: AC
  Administered 2020-06-21: 4 mg via INTRAVENOUS

## 2020-06-21 MED ORDER — BORTEZOMIB CHEMO SQ INJECTION 3.5 MG (2.5MG/ML)
1.3000 mg/m2 | Freq: Once | INTRAMUSCULAR | Status: AC
  Administered 2020-06-21: 2.5 mg via SUBCUTANEOUS
  Filled 2020-06-21: qty 1

## 2020-06-21 MED ORDER — ZOLEDRONIC ACID 4 MG/100ML IV SOLN
INTRAVENOUS | Status: AC
  Filled 2020-06-21: qty 100

## 2020-06-21 MED ORDER — PROCHLORPERAZINE MALEATE 10 MG PO TABS: 10.0000 mg | ORAL_TABLET | Freq: Once | ORAL | Status: DC

## 2020-06-21 MED ORDER — SODIUM CHLORIDE 0.9 % IV SOLN
Freq: Once | INTRAVENOUS | Status: AC
  Filled 2020-06-21: qty 250

## 2020-06-21 NOTE — Patient Instructions (Signed)
[facility] Discharge Instructions for Patients Receiving Chemotherapy  Today you received the following chemotherapy agents Velcade  To help prevent nausea and vomiting after your treatment, we encourage you to take your nausea medication as directed.    If you develop nausea and vomiting that is not controlled by your nausea medication, call the clinic.   BELOW ARE SYMPTOMS THAT SHOULD BE REPORTED IMMEDIATELY:  *FEVER GREATER THAN 100.5 F  *CHILLS WITH OR WITHOUT FEVER  NAUSEA AND VOMITING THAT IS NOT CONTROLLED WITH YOUR NAUSEA MEDICATION  *UNUSUAL SHORTNESS OF BREATH  *UNUSUAL BRUISING OR BLEEDING  TENDERNESS IN MOUTH AND THROAT WITH OR WITHOUT PRESENCE OF ULCERS  *URINARY PROBLEMS  *BOWEL PROBLEMS  UNUSUAL RASH Items with * indicate a potential emergency and should be followed up as soon as possible.  Feel free to call the clinic should you have any questions or concerns. The clinic phone number is [phone].  Please show the CHEMO ALERT CARD at check-in to the Emergency Department and triage nurse.   Zoledronic Acid injection (Hypercalcemia, Oncology) What is this medicine? ZOLEDRONIC ACID (ZOE le dron ik AS id) lowers the amount of calcium loss from bone. It is used to treat too much calcium in your blood from cancer. It is also used to prevent complications of cancer that has spread to the bone. This medicine may be used for other purposes; ask your health care provider or pharmacist if you have questions. COMMON BRAND NAME(S): Zometa What should I tell my health care provider before I take this medicine? They need to know if you have any of these conditions:  aspirin-sensitive asthma  cancer, especially if you are receiving medicines used to treat cancer  dental disease or wear dentures  infection  kidney disease  receiving corticosteroids like dexamethasone or prednisone  an unusual or allergic reaction to zoledronic acid, other  medicines, foods, dyes, or preservatives  pregnant or trying to get pregnant  breast-feeding How should I use this medicine? This medicine is for infusion into a vein. It is given by a health care professional in a hospital or clinic setting. Talk to your pediatrician regarding the use of this medicine in children. Special care may be needed. Overdosage: If you think you have taken too much of this medicine contact a poison control center or emergency room at once. NOTE: This medicine is only for you. Do not share this medicine with others. What if I miss a dose? It is important not to miss your dose. Call your doctor or health care professional if you are unable to keep an appointment. What may interact with this medicine?  certain antibiotics given by injection  NSAIDs, medicines for pain and inflammation, like ibuprofen or naproxen  some diuretics like bumetanide, furosemide  teriparatide  thalidomide This list may not describe all possible interactions. Give your health care provider a list of all the medicines, herbs, non-prescription drugs, or dietary supplements you use. Also tell them if you smoke, drink alcohol, or use illegal drugs. Some items may interact with your medicine. What should I watch for while using this medicine? Visit your doctor or health care professional for regular checkups. It may be some time before you see the benefit from this medicine. Do not stop taking your medicine unless your doctor tells you to. Your doctor may order blood tests or other tests to see how you are doing. Women should inform their doctor if they wish to become pregnant or think they might   be pregnant. There is a potential for serious side effects to an unborn child. Talk to your health care professional or pharmacist for more information. You should make sure that you get enough calcium and vitamin D while you are taking this medicine. Discuss the foods you eat and the vitamins you take  with your health care professional. Some people who take this medicine have severe bone, joint, and/or muscle pain. This medicine may also increase your risk for jaw problems or a broken thigh bone. Tell your doctor right away if you have severe pain in your jaw, bones, joints, or muscles. Tell your doctor if you have any pain that does not go away or that gets worse. Tell your dentist and dental surgeon that you are taking this medicine. You should not have major dental surgery while on this medicine. See your dentist to have a dental exam and fix any dental problems before starting this medicine. Take good care of your teeth while on this medicine. Make sure you see your dentist for regular follow-up appointments. What side effects may I notice from receiving this medicine? Side effects that you should report to your doctor or health care professional as soon as possible:  allergic reactions like skin rash, itching or hives, swelling of the face, lips, or tongue  anxiety, confusion, or depression  breathing problems  changes in vision  eye pain  feeling faint or lightheaded, falls  jaw pain, especially after dental work  mouth sores  muscle cramps, stiffness, or weakness  redness, blistering, peeling or loosening of the skin, including inside the mouth  trouble passing urine or change in the amount of urine Side effects that usually do not require medical attention (report to your doctor or health care professional if they continue or are bothersome):  bone, joint, or muscle pain  constipation  diarrhea  fever  hair loss  irritation at site where injected  loss of appetite  nausea, vomiting  stomach upset  trouble sleeping  trouble swallowing  weak or tired This list may not describe all possible side effects. Call your doctor for medical advice about side effects. You may report side effects to FDA at 1-800-FDA-1088. Where should I keep my medicine? This drug  is given in a hospital or clinic and will not be stored at home. NOTE: This sheet is a summary. It may not cover all possible information. If you have questions about this medicine, talk to your doctor, pharmacist, or health care provider.  2020 Elsevier/Gold Standard (2014-01-31 14:19:39)  

## 2020-06-22 LAB — KAPPA/LAMBDA LIGHT CHAINS
Kappa free light chain: 25.2 mg/L — ABNORMAL HIGH (ref 3.3–19.4)
Kappa, lambda light chain ratio: 1.35 (ref 0.26–1.65)
Lambda free light chains: 18.6 mg/L (ref 5.7–26.3)

## 2020-06-22 LAB — PROTEIN ELECTROPHORESIS, SERUM
A/G Ratio: 1.1 (ref 0.7–1.7)
Albumin ELP: 3.6 g/dL (ref 2.9–4.4)
Alpha-1-Globulin: 0.2 g/dL (ref 0.0–0.4)
Alpha-2-Globulin: 0.8 g/dL (ref 0.4–1.0)
Beta Globulin: 1.1 g/dL (ref 0.7–1.3)
Gamma Globulin: 1.1 g/dL (ref 0.4–1.8)
Globulin, Total: 3.2 g/dL (ref 2.2–3.9)
M-Spike, %: 0.3 g/dL — ABNORMAL HIGH
Total Protein ELP: 6.8 g/dL (ref 6.0–8.5)

## 2020-06-22 LAB — IGG: IgG (Immunoglobin G), Serum: 969 mg/dL (ref 603–1613)

## 2020-06-23 ENCOUNTER — Ambulatory Visit: Payer: Medicare Other | Attending: Oncology | Admitting: Rehabilitation

## 2020-06-23 ENCOUNTER — Other Ambulatory Visit: Payer: Self-pay

## 2020-06-23 ENCOUNTER — Encounter: Payer: Self-pay | Admitting: Rehabilitation

## 2020-06-23 DIAGNOSIS — R2681 Unsteadiness on feet: Secondary | ICD-10-CM

## 2020-06-23 NOTE — Therapy (Signed)
[place], [place], [address] Phone: [phone]   Fax:  [phone]  Physical Therapy Evaluation  Patient Details  Name: Andre [name] MRN: [id number] Date of Birth: 01/31/1951 Referring Provider (PT): Dr. [name]   Encounter Date: 06/23/2020   PT End of Session - 06/23/20 1046    Visit Number 1    Number of Visits 13    Date for PT Re-Evaluation 08/18/20    Authorization Type Medicare    PT Start Time 1002    PT Stop Time 1046    PT Time Calculation (min) 44 min    Activity Tolerance Patient tolerated treatment well    Behavior During Therapy [name] for tasks assessed/performed           Past Medical History:  Diagnosis Date   Allergy    Anemia    Arthritis    Asthma    no treatment x 20 years   Depression    Double vision    occurs at times    Duodenal ulcer    GERD (gastroesophageal reflux disease)    Hyperlipidemia    Hypertension    Hypothyroidism    Multiple myeloma 07/04/2011   Thyroid disease     Past Surgical History:  Procedure Laterality Date   BONE MARROW TRANSPLANT  2011   for MM   CARDIOLITE STUDY  11/25/2003   NORMAL   TONSILLECTOMY      There were no vitals filed for this visit.    Subjective Assessment - 06/23/20 0957    Subjective I was jogging and fell off to the right side I went to the ER.  I had to get 3 stitches, abrasions on the knee and hurt the Rt shoulder.  I lost my balance at a soccer game this weekend.    Pertinent History Multiple myeloma not in remission with bone marrow transplant in 2011 and 2017. Current infusions of bortezomib every 21 days and zoledronate every 12 weeks.  Imaging on 04/05/20 showing stable lytic lesions bilateral humeri and femurs, skull, and left radius.  CIPN.    Limitations --   i can do everything unless they don't me   How long can you walk comfortably? 8mn on the TM    Diagnostic tests Imaging on  04/05/20 showing stable lytic lesions bilateral humeri and femurs, skull, and left radius.  CIPN.    Patient Stated Goals I would like to get my balance comfortable    Currently in Pain? No/denies              [facility] - 06/23/20 0001      Assessment   Medical Diagnosis multiple myeloma    Referring Provider (PT) Dr. MJana Hakim   Onset Date/Surgical Date 09/18/09    Hand Dominance Right    Prior Therapy no      Precautions   Precaution Comments fall, bony mets      Restrictions   Weight Bearing Restrictions No      Balance Screen   Has the patient fallen in the past 6 months Yes    How many times? 1    Has the patient had a decrease in activity level because of a fear of falling?  No    Is the patient reluctant to leave their home because of a fear of falling?  No      Home [facility]idence    Living Arrangements Spouse/significant  other    Available Help at Discharge Family      Prior Function   Level of Independence Independent    Vocation Retired    Leisure bike, TM, resistance bands and core work 3x week, every other day walking       Cognition   Overall Cognitive Status Within Functional Limits for tasks assessed      Observation/Other Assessments   Observations healing stitches on the forehead and pain in the ribs       Coordination   Gross Motor Movements are Fluid and Coordinated Yes      Posture/Postural Control   Posture/Postural Control Postural limitations    Postural Limitations Rounded Shoulders;Forward head      Ambulation/Gait   Gait Comments amublates wtihout AD with thoarcic kyphosis      Balance   Balance Assessed Yes      Standardized Balance Assessment   Standardized Balance Assessment Berg Balance Test;Timed Up and Go Test;Five Times Sit to Stand    Five times sit to stand comments  12.83 seconds      Timed Up and Go Test   TUG Normal TUG    Normal TUG (seconds) 10.2      High Level  Balance   High Level Balance Comments [place] Advanced Balance Scale: 20/40 with most dififculty on eyes closed, narrow BOS, tandem stance, and walking in a circle                      Objective measurements completed on examination: See above findings.               PT Education - 06/23/20 1046    Education Details POC    Person(s) Educated Patient    Methods Explanation    Comprehension Verbalized understanding               PT Long Term Goals - 06/23/20 1056      PT LONG TERM GOAL #1   Title Pt will improve Full[place] balance scale to at least 25/40 to minimize risk of falls    Baseline 20    Time 6    Period Weeks    Status New      PT LONG TERM GOAL #2   Title Pt will be ind with final HEP with balance focus    Time 6    Period Weeks    Status New      PT LONG TERM GOAL #3   Title Pt will be able to demonstrate proper balance correction with hip and ankle movements as needed in response to perturbations    Time 6    Period Weeks    Status New                  Plan - 06/23/20 1046    Clinical Impression Statement Pt is a very active man who continues to walk/run, ride his bicycle, perform resistance exercises with bands for the LEs and UEs up to 60 pounds.  He recently had a fall when trying to run and has noticed his balance off standing in grass lately.  His TUG is WNL and 5 times sit to stand is right at aged matched norm but with last 2 sit to stands no full and with weight shift mostly to the Rt LE when standing.  The fullerton balance scale shows fall risk at 20/40 with most difficulties with narrow BOS and center of gravity shifts.  LOB  posteriorly with standing on the foam.  Pt is agreeable to working on improving balance 2x per week.  Also had a discussion with patient regarding bony lesions and exercising advising him to stop use of resistance if he notices any pain with movement or starts to have any functional pain.  Also  discussed the impact of no activity being more risky than possible adverse events with exercise although the risk is there with bony lesions.  Pt was encouraged not to run due to fall.    Personal Factors and Comorbidities Age;Comorbidity 1    Comorbidities bony mets    Examination-Activity Limitations Other   walking/running   Stability/Clinical Decision Making Stable/Uncomplicated    Clinical Decision Making Low    Rehab Potential Good    PT Frequency 2x / week    PT Duration 6 weeks    PT Treatment/Interventions ADLs/Self Care Home Management;Therapeutic exercise;Patient/family education;Neuromuscular re-education    PT Next Visit Plan start balance work; foam, eyes closed, narrow BOS, cones, single leg, stepping strategy due to LOB posteriorly    Consulted and Agree with Plan of Care Patient           Patient will benefit from skilled therapeutic intervention in order to improve the following deficits and impairments:  Decreased balance  Visit Diagnosis: Unsteadiness on feet     Problem List Patient Active Problem List   Diagnosis Date Noted   Hemorrhoids 11/20/2019   Neuropathy due to chemotherapeutic drug ([place]) 09/01/2019   Leukopenia due to antineoplastic chemotherapy ([place]) 02/22/2017   Dry skin dermatitis 08/16/2016   Peripheral edema 04/26/2016   Bone marrow transplant status ([place]) 03/14/2016   Debility 02/26/2016   Back pain 02/26/2016   Febrile neutropenia ([place]) 02/15/2016   Patient in clinical research study 01/10/2016   Rash and nonspecific skin eruption 01/06/2016   HCAP (healthcare-associated pneumonia)    GERD (gastroesophageal reflux disease)    Dehydration 12/22/2015   Weakness 12/22/2015   Asthma exacerbation 12/22/2015   Hyponatremia 12/22/2015   General weakness    Multiple myeloma not having achieved remission ([place])    Sepsis ([place])    Chronic anemia    SBO (small bowel obstruction) ([place]) 11/07/2015   Constipation     Actinic keratosis 07/08/2015   Transient global amnesia 06/07/2015   Diarrhea 09/17/2014   Raynaud's phenomenon 09/17/2013   Hemorrhage of rectum and anus 09/15/2013   Skin lesion, superficial 07/29/2013   Cough 01/10/2013   Abnormal brain MRI 10/08/2012   Vitamin D deficiency 09/30/2012   Hypothyroidism 09/30/2012   Hypogonadism male 09/30/2012   Fatigue 09/27/2012   Insomnia 09/27/2012   Reactive depression (situational) 09/27/2012   Well adult exam 09/27/2012   Decreased energy 04/18/2012   TIA (transient ischemic attack) 03/27/2012   Transient diplopia 03/27/2012   HYPERLIPIDEMIA 05/30/2010   ANEMIA 05/30/2010   Asthma 05/30/2010   SCHATZKI'S RING 05/30/2010   Peptic ulcer disease 05/30/2010   HIATAL HERNIA 05/30/2010   SLEEP APNEA, MILD 05/30/2010   TRANSAMINASES, SERUM, ELEVATED 05/30/2010    Stark Bray 06/23/2020, 10:59 AM  [place], [place], [address] Phone: [phone]   Fax:  [phone]  Name: Loudon Krakow MRN: [id number] Date of Birth: June 23, 1951

## 2020-06-24 ENCOUNTER — Ambulatory Visit: Payer: Medicare Other

## 2020-06-24 DIAGNOSIS — R2681 Unsteadiness on feet: Secondary | ICD-10-CM

## 2020-06-24 NOTE — Patient Instructions (Signed)
Pt was given handwritten instructions for single leg stance 3 x 20-30 sec ea, marching in place x 30, tandem walking, cone taps with cone on table in SLS.

## 2020-06-24 NOTE — Therapy (Signed)
[place], [place], [address] Phone: [phone]   Fax:  [phone]  Physical Therapy Treatment  Patient Details  Name: Andre [name] MRN: [id number] Date of Birth: Feb 07, 1951 Referring Provider (PT): Dr. [name]   Encounter Date: 06/24/2020   PT End of Session - 06/24/20 1416    Visit Number 2    Number of Visits 13    Date for PT Re-Evaluation 08/18/20    Authorization Type Medicare    PT Start Time 1310    PT Stop Time 9379    PT Time Calculation (min) 47 min    Equipment Utilized During Treatment Gait belt    Activity Tolerance Patient tolerated treatment well    Behavior During Therapy [facility] for tasks assessed/performed           Past Medical History:  Diagnosis Date   Allergy    Anemia    Arthritis    Asthma    no treatment x 20 years   Depression    Double vision    occurs at times    Duodenal ulcer    GERD (gastroesophageal reflux disease)    Hyperlipidemia    Hypertension    Hypothyroidism    Multiple myeloma 07/04/2011   Thyroid disease     Past Surgical History:  Procedure Laterality Date   BONE MARROW TRANSPLANT  2011   for MM   CARDIOLITE STUDY  11/25/2003   NORMAL   TONSILLECTOMY      There were no vitals filed for this visit.   Subjective Assessment - 06/24/20 1308    Subjective My balance is still off.  No present pain.  Get stitches out tomorrow.  Ribs are starting to feel better from my fall. No adverse effects from last visit.    Currently in Pain? No/denies                             [facility] Adult PT Treatment/Exercise - 06/24/20 0001      Neuro Re-ed    Neuro Re-ed Details   with gait belt and CGA In parallel bars:Pt. performed SLS on floor 20 sec ea x 3, Ax pad eyes open/ closed x 2 ea for 20 sec, marching on ax x 20, step on ax forward and lateral x12 ea, cone touches with cone on table 3 x 10 ea leg,, walking around  cones and sidestepping cones 4 laps ea, tandem walking and [place] 4 ea,, sit to stand x 5 from chair                  PT Education - 06/24/20 1415    Education Details Pt educated in balance activities to perform at home including SLS, SLS cone touches with cone on table, marching in place and tandem walking.  To perform near a counter for assist prn    Person(s) Educated Patient    Methods Demonstration;Handout;Explanation    Comprehension Verbalized understanding;Returned demonstration               PT Long Term Goals - 06/23/20 1056      PT LONG TERM GOAL #1   Title Pt will improve [place] balance scale to at least 25/40 to minimize risk of falls    Baseline 20    Time 6    Period Weeks    Status New      PT LONG TERM GOAL #2  Title Pt will be ind with final HEP with balance focus    Time 6    Period Weeks    Status New      PT LONG TERM GOAL #3   Title Pt will be able to demonstrate proper balance correction with hip and ankle movements as needed in response to perturbations    Time 6    Period Weeks    Status New                 Plan - 06/24/20 1418    Clinical Impression Statement Neuromuscular control activities performed today done with gait belt and CGA.Pt demonstrated numerous LOB and control difficulties throughout performance of neuromuscular activities.  particular difficulties noted with marching on ax, cone touches in SLS, ax step ups, and tandem walking.  He would initially start with good control and would lose control after several repetitions getting faster and losing control and balance.    Personal Factors and Comorbidities Age;Comorbidity 1    Comorbidities bony mets    Stability/Clinical Decision Making Stable/Uncomplicated    Clinical Decision Making Low           Patient will benefit from skilled therapeutic intervention in order to improve the following deficits and impairments:  Decreased balance  Visit  Diagnosis: Unsteadiness on feet     Problem List Patient Active Problem List   Diagnosis Date Noted   Hemorrhoids 11/20/2019   Neuropathy due to chemotherapeutic drug ([place]) 09/01/2019   Leukopenia due to antineoplastic chemotherapy ([place]) 02/22/2017   Dry skin dermatitis 08/16/2016   Peripheral edema 04/26/2016   Bone marrow transplant status ([place]) 03/14/2016   Debility 02/26/2016   Back pain 02/26/2016   Febrile neutropenia ([place]) 02/15/2016   Patient in clinical research study 01/10/2016   Rash and nonspecific skin eruption 01/06/2016   HCAP (healthcare-associated pneumonia)    GERD (gastroesophageal reflux disease)    Dehydration 12/22/2015   Weakness 12/22/2015   Asthma exacerbation 12/22/2015   Hyponatremia 12/22/2015   General weakness    Multiple myeloma not having achieved remission ([place])    Sepsis ([place])    Chronic anemia    SBO (small bowel obstruction) ([place]) 11/07/2015   Constipation    Actinic keratosis 07/08/2015   Transient global amnesia 06/07/2015   Diarrhea 09/17/2014   Raynaud's phenomenon 09/17/2013   Hemorrhage of rectum and anus 09/15/2013   Skin lesion, superficial 07/29/2013   Cough 01/10/2013   Abnormal brain MRI 10/08/2012   Vitamin D deficiency 09/30/2012   Hypothyroidism 09/30/2012   Hypogonadism male 09/30/2012   Fatigue 09/27/2012   Insomnia 09/27/2012   Reactive depression (situational) 09/27/2012   Well adult exam 09/27/2012   Decreased energy 04/18/2012   TIA (transient ischemic attack) 03/27/2012   Transient diplopia 03/27/2012   HYPERLIPIDEMIA 05/30/2010   ANEMIA 05/30/2010   Asthma 05/30/2010   SCHATZKI'S RING 05/30/2010   Peptic ulcer disease 05/30/2010   HIATAL HERNIA 05/30/2010   SLEEP APNEA, MILD 05/30/2010   TRANSAMINASES, SERUM, ELEVATED 05/30/2010    Claris Pong, PT 06/24/2020, 2:27 PM  [place], [place], [address] Phone: [phone]   Fax:  [phone]  Name: Placido Hangartner MRN: [id number] Date of Birth: 02-04-1951

## 2020-06-25 ENCOUNTER — Other Ambulatory Visit: Payer: Self-pay

## 2020-06-25 ENCOUNTER — Ambulatory Visit (INDEPENDENT_AMBULATORY_CARE_PROVIDER_SITE_OTHER): Payer: Medicare Other | Admitting: Family

## 2020-06-25 ENCOUNTER — Encounter: Payer: Self-pay | Admitting: Family

## 2020-06-25 VITALS — BP 116/76 | HR 70 | Temp 97.9°F | Ht 69.5 in | Wt 177.0 lb

## 2020-06-25 DIAGNOSIS — Z4802 Encounter for removal of sutures: Secondary | ICD-10-CM | POA: Diagnosis not present

## 2020-06-25 DIAGNOSIS — Z23 Encounter for immunization: Secondary | ICD-10-CM | POA: Diagnosis not present

## 2020-06-25 NOTE — Progress Notes (Addendum)
Andre [name] is a 69 y.o. male with the following history as recorded in EpicCare:  Patient Active Problem List   Diagnosis Date Noted  . Hemorrhoids 11/20/2019  . Neuropathy due to chemotherapeutic drug (HCC) 09/01/2019  . Leukopenia due to antineoplastic chemotherapy (HCC) 02/22/2017  . Dry skin dermatitis 08/16/2016  . Peripheral edema 04/26/2016  . Bone marrow transplant status (HCC) 03/14/2016  . Debility 02/26/2016  . Back pain 02/26/2016  . Febrile neutropenia (HCC) 02/15/2016  . Patient in clinical research study 01/10/2016  . Rash and nonspecific skin eruption 01/06/2016  . HCAP (healthcare-associated pneumonia)   . GERD (gastroesophageal reflux disease)   . Dehydration 12/22/2015  . Weakness 12/22/2015  . Asthma exacerbation 12/22/2015  . Hyponatremia 12/22/2015  . General weakness   . Multiple myeloma not having achieved remission (HCC)   . Sepsis (HCC)   . Chronic anemia   . SBO (small bowel obstruction) (HCC) 11/07/2015  . Constipation   . Actinic keratosis 07/08/2015  . Transient global amnesia 06/07/2015  . Diarrhea 09/17/2014  . Raynaud's phenomenon 09/17/2013  . Hemorrhage of rectum and anus 09/15/2013  . Skin lesion, superficial 07/29/2013  . Cough 01/10/2013  . Abnormal brain MRI 10/08/2012  . Vitamin D deficiency 09/30/2012  . Hypothyroidism 09/30/2012  . Hypogonadism male 09/30/2012  . Fatigue 09/27/2012  . Insomnia 09/27/2012  . Reactive depression (situational) 09/27/2012  . Well adult exam 09/27/2012  . Decreased energy 04/18/2012  . TIA (transient ischemic attack) 03/27/2012  . Transient diplopia 03/27/2012  . HYPERLIPIDEMIA 05/30/2010  . ANEMIA 05/30/2010  . Asthma 05/30/2010  . SCHATZKI'S RING 05/30/2010  . Peptic ulcer disease 05/30/2010  . HIATAL HERNIA 05/30/2010  . SLEEP APNEA, MILD 05/30/2010  . TRANSAMINASES, SERUM, ELEVATED 05/30/2010    Current Outpatient Medications  Medication Sig Dispense Refill  . calcium-vitamin D  (OSCAL WITH D) 500-200 MG-UNIT tablet Take 1 tablet by mouth daily.    . cetirizine (ZYRTEC) 10 MG tablet Take 10 mg by mouth daily.    . Cholecalciferol (VITAMIN D3) 2000 units capsule Take 1 capsule (2,000 Units total) by mouth daily. 100 capsule 3  . docusate sodium (COLACE) 100 MG capsule Take 100 mg by mouth daily as needed for mild constipation or moderate constipation.    . escitalopram (LEXAPRO) 10 MG tablet Take 1 tablet (10 mg total) by mouth daily. 90 tablet 1  . HYDROCORTISONE ACE, RECTAL, 30 MG SUPP 1 pr bid x 7 d prn 14 suppository 3  . levothyroxine (SYNTHROID) 150 MCG tablet TAKE 1 TABLET BY MOUTH  EVERY MORNING ON AN EMPTY  STOMACH 90 tablet 3  . ondansetron (ZOFRAN) 8 MG tablet Take 1 tablet (8 mg total) by mouth every 8 (eight) hours as needed for nausea or vomiting. 20 tablet 3  . pantoprazole (PROTONIX) 40 MG tablet TAKE 1 TABLET BY MOUTH  TWICE A DAY BEFORE MEALS 180 tablet 3  . triamcinolone ointment (KENALOG) 0.5 % Apply 1 application topically 2 (two) times daily. 30 g 3  . valACYclovir (VALTREX) 1000 MG tablet Take 1 tablet (1,000 mg total) by mouth daily. 90 tablet 3  . zolendronic acid (ZOMETA) 4 MG/5ML injection Inject 4 mg into the vein every 3 (three) months.     No current facility-administered medications for this visit.    Allergies: Atorvastatin, Rosuvastatin, Crestor [rosuvastatin calcium], Lipitor [atorvastatin calcium], and Septra [sulfamethoxazole-trimethoprim]  Past Medical History:  Diagnosis Date  . Allergy   . Anemia   . Arthritis   .   Asthma    no treatment x 20 years  . Depression   . Double vision    occurs at times   . Duodenal ulcer   . GERD (gastroesophageal reflux disease)   . Hyperlipidemia   . Hypertension   . Hypothyroidism   . Multiple myeloma 07/04/2011  . Thyroid disease     Past Surgical History:  Procedure Laterality Date  . BONE MARROW TRANSPLANT  2011   for MM  . CARDIOLITE STUDY  11/25/2003   NORMAL  . TONSILLECTOMY       Family History  Problem Relation Age of Onset  . Throat cancer Mother   . Hypertension Father   . Stroke Father   . Asthma Father   . Diabetes Father   . Pancreatic cancer Brother     Social History   Tobacco Use  . Smoking status: Former Smoker    Years: 3.00    Quit date: 03/27/1969    Years since quitting: 51.2  . Smokeless tobacco: Never Used  Substance Use Topics  . Alcohol use: No    Subjective:  ER follow-up- was running and fell/ required 3 stitches on side of forehead; needs to have these removed today; has no concerns about healing;  Would also like to get his flu shot today;   Objective:  Vitals:   06/25/20 1028  BP: 116/76  Pulse: 70  Temp: 97.9 F (36.6 C)  TempSrc: Oral  SpO2: 97%  Weight: 177 lb (80.3 kg)  Height: 5' 9.5" (1.765 m)    General: Well developed, well nourished, in no acute distress  Skin : Warm and dry. Well healed laceration on right side of temple- 3 sutures removed with no difficulty; scab in place;  Head: Normocephalic and atraumatic  Lungs: Respirations unlabored;  Neurologic: Alert and oriented; speech intact; face symmetrical; moves all extremities well; CNII-XII intact without focal deficit   Assessment:  1. Visit for suture removal   2. Flu vaccine need     Plan:  1. Patient does consent to have suture removed and tolerated the procedure well. Sutures removed with no difficulty;  2. Flu vaccine given today;  This visit occurred during the SARS-CoV-2 public health emergency.  Safety protocols were in place, including screening questions prior to the visit, additional usage of staff PPE, and extensive cleaning of exam room while observing appropriate contact time as indicated for disinfecting solutions.     No follow-ups on file.  Orders Placed This Encounter  Procedures  . Flu Vaccine QUAD High Dose(Fluad)    Requested Prescriptions    No prescriptions requested or ordered in this encounter     

## 2020-07-05 ENCOUNTER — Other Ambulatory Visit: Payer: Self-pay

## 2020-07-05 ENCOUNTER — Ambulatory Visit: Payer: Medicare Other

## 2020-07-05 DIAGNOSIS — R2681 Unsteadiness on feet: Secondary | ICD-10-CM | POA: Diagnosis not present

## 2020-07-05 NOTE — Therapy (Signed)
[place], [place], [address] Phone: [phone]   Fax:  [phone]  Physical Therapy Treatment  Patient Details  Name: Andre [name] MRN: [id number] Date of Birth: Nov 20, 1950 Referring Provider (PT): Dr. [name]   Encounter Date: 07/05/2020   PT End of Session - 07/05/20 1504    Visit Number 3    Number of Visits 13    Date for PT Re-Evaluation 08/18/20    Authorization Type Medicare    PT Start Time 1406    PT Stop Time 1456    PT Time Calculation (min) 50 min    Equipment Utilized During Treatment Other (comment)   held pts belt at times indicated   Activity Tolerance Patient tolerated treatment well    Behavior During Therapy [facility] for tasks assessed/performed           Past Medical History:  Diagnosis Date  . Allergy   . Anemia   . Arthritis   . Asthma    no treatment x 20 years  . Depression   . Double vision    occurs at times   . Duodenal ulcer   . GERD (gastroesophageal reflux disease)   . Hyperlipidemia   . Hypertension   . Hypothyroidism   . Multiple myeloma 07/04/2011  . Thyroid disease     Past Surgical History:  Procedure Laterality Date  . BONE MARROW TRANSPLANT  2011   for MM  . CARDIOLITE STUDY  11/25/2003   NORMAL  . TONSILLECTOMY      There were no vitals filed for this visit.   Subjective Assessment - 07/05/20 1423    Subjective I practiced my new HEP some over the weekend.    Pertinent History Multiple myeloma not in remission with bone marrow transplant in 2011 and 2017. Current infusions of bortezomib every 21 days and zoledronate every 12 weeks.  Imaging on 04/05/20 showing stable lytic lesions bilateral humeri and femurs, skull, and left radius.  CIPN.    Patient Stated Goals I would like to get my balance comfortable    Currently in Pain? No/denies                             [facility] Adult PT Treatment/Exercise - 07/05/20 0001        Neuro Re-ed    Neuro Re-ed Details  In // bars: Heel-toe front and retro walking 4x each with max VCs and demo throughout as pt struggled with kinesthic awareness of feet placement; then slow, controlled high knee marching encrouaging 3 sec holds with each step, very challenging for pt; crossover in front walking Rt and Lt 2x each with max VCs and demo throughout for foot placement, then [place] 2x Rt and Lt; then walked on inclined slope of black peds mat holding pts belt: walking with cuing for correct heel-toe pattern as pt found himself marching, then crossover walking in front with heels on down slope and pt holding hterapist forearms; then back in // bars: Tried stepping over/on foam obstacles which was minimally challenging, then standing on foam rocker board to static stand x1 min side-side and then front t-back; ended session with heel-toe walking with +2 HHA front and retro 1x each. Pt did better with foot placement and weight shift with eyes closed than he did eyes open. 3 seated rest breaks during due to pt feeling fatigue and during session multiple VCs to maintain COG as  he struggled with letting  his feet get ahead of hime, mostly with retro and side walking                       PT Long Term Goals - 06/23/20 1056      PT LONG TERM GOAL #1   Title Pt will improve [place] balance scale to at least 25/40 to minimize risk of falls    Baseline 20    Time 6    Period Weeks    Status New      PT LONG TERM GOAL #2   Title Pt will be ind with final HEP with balance focus    Time 6    Period Weeks    Status New      PT LONG TERM GOAL #3   Title Pt will be able to demonstrate proper balance correction with hip and ankle movements as needed in response to perturbations    Time 6    Period Weeks    Status New                 Plan - 07/05/20 1636    Clinical Impression Statement Continued with focus on neuromusclar activities today, especially with focus to  work on kinesthetic awareness of foot placement with gait on level surface and inclined surface. Very challenging when pt ambulated on inclined edge of peds mat and +2 HHA on therapist forearm provided. Pt benefitted from multiple VCs to think about placing heel down with gait, especially when becoming fatigued as he would unintentionally exagerrate his step otherwise. Seated rest breaks throughout session at times of fatigue. Pt overall did show some progress and surprisingly when performed heel-toe with eyes closed, he was able to maintain COG and control placement of feet with much better control than during rest of session eyes open. Pt will benefit from continued neurmuscular training on level and unlevel surfaces as these are challenging to his balance.    Personal Factors and Comorbidities Age;Comorbidity 1    Comorbidities bony mets    Examination-Activity Limitations Other   walking/running   Stability/Clinical Decision Making Stable/Uncomplicated    Rehab Potential Good    PT Frequency 2x / week    PT Duration 6 weeks    PT Treatment/Interventions ADLs/Self Care Home Management;Therapeutic exercise;Patient/family education;Neuromuscular re-education    PT Next Visit Plan Cont balance work; foam, eyes closed, narrow BOS, cones, single leg, stepping strategy due to LOB posteriorly, unlevel surface (was challenged by balance activities on inclined edge of black peds mat)    Consulted and Agree with Plan of Care Patient           Patient will benefit from skilled therapeutic intervention in order to improve the following deficits and impairments:  Decreased balance  Visit Diagnosis: Unsteadiness on feet     Problem List Patient Active Problem List   Diagnosis Date Noted  . Hemorrhoids 11/20/2019  . Neuropathy due to chemotherapeutic drug ([place]) 09/01/2019  . Leukopenia due to antineoplastic chemotherapy ([place]) 02/22/2017  . Dry skin dermatitis 08/16/2016  . Peripheral edema  04/26/2016  . Bone marrow transplant status ([place]) 03/14/2016  . Debility 02/26/2016  . Back pain 02/26/2016  . Febrile neutropenia ([place]) 02/15/2016  . Patient in clinical research study 01/10/2016  . Rash and nonspecific skin eruption 01/06/2016  . HCAP (healthcare-associated pneumonia)   . GERD (gastroesophageal reflux disease)   . Dehydration 12/22/2015  . Weakness 12/22/2015  . Asthma exacerbation  12/22/2015  . Hyponatremia 12/22/2015  . General weakness   . Multiple myeloma not having achieved remission ([place])   . Sepsis ([place])   . Chronic anemia   . SBO (small bowel obstruction) ([place]) 11/07/2015  . Constipation   . Actinic keratosis 07/08/2015  . Transient global amnesia 06/07/2015  . Diarrhea 09/17/2014  . Raynaud's phenomenon 09/17/2013  . Hemorrhage of rectum and anus 09/15/2013  . Skin lesion, superficial 07/29/2013  . Cough 01/10/2013  . Abnormal brain MRI 10/08/2012  . Vitamin D deficiency 09/30/2012  . Hypothyroidism 09/30/2012  . Hypogonadism male 09/30/2012  . Fatigue 09/27/2012  . Insomnia 09/27/2012  . Reactive depression (situational) 09/27/2012  . Well adult exam 09/27/2012  . Decreased energy 04/18/2012  . TIA (transient ischemic attack) 03/27/2012  . Transient diplopia 03/27/2012  . HYPERLIPIDEMIA 05/30/2010  . ANEMIA 05/30/2010  . Asthma 05/30/2010  . SCHATZKI'S RING 05/30/2010  . Peptic ulcer disease 05/30/2010  . HIATAL HERNIA 05/30/2010  . SLEEP APNEA, MILD 05/30/2010  . TRANSAMINASES, SERUM, ELEVATED 05/30/2010    Otelia Limes, PTA 07/05/2020, 4:54 PM  [place], [place], [address] Phone: [phone]   Fax:  [phone]  Name: Andre [name] MRN: [id number] Date of Birth: [birth year]

## 2020-07-06 ENCOUNTER — Encounter: Payer: Medicare Other | Admitting: Rehabilitation

## 2020-07-07 ENCOUNTER — Other Ambulatory Visit: Payer: Self-pay

## 2020-07-07 ENCOUNTER — Ambulatory Visit: Payer: Medicare Other

## 2020-07-07 DIAGNOSIS — R2681 Unsteadiness on feet: Secondary | ICD-10-CM

## 2020-07-07 NOTE — Patient Instructions (Signed)
Heel Raise: Bilateral (Standing)   Cancer Rehab [phone]    Stand near counter for fingertip support if needed. Rise on balls of feet. Then try walking on toes with fingertips on counter.  Repeat __10-20__ times per set. Do _1-2___ sets per session. Do __2__ sessions per day.  SINGLE LIMB STANCE    Stand at counter (corner if you have one) for minimal arm support. Raise leg. Hold _10-20__ seconds. Repeat with other leg. Once this becomes easier, for increased challenge, close eyes with fingertips on counter for safety. _3-5__ reps per set, _2-3__ sets per day.   Tandem Stance    Stand at counter (corner if you have one). Right foot in front of left, heel touching toe both feet "straight ahead". Stand on Foot Triangle of Support with both feet. Balance in this position _10-20__ seconds.  Can also try walking in heel-toe pattern with fingertips on counter.  Do with left foot in front of right. Once this becomes easier, for increased challenge, close eyes with fingertips on counter for safety.  Step-Up: Forward    Move step-stool to counter or hold rail if at steps, but as little as able. Step up forward keeping opposite foot off step. Keep pelvis level and back straight. Hold the single leg stand for 3 seconds, then come back down slowly.  Do _10__ times, do _1-2_ sets, on each leg, _1-2__ times per day.

## 2020-07-07 NOTE — Therapy (Signed)
[place], [place], [address] Phone: [phone]   Fax:  [phone]  Physical Therapy Treatment  Patient Details  Name: Andre [name] MRN: [id number] Date of Birth: [birth year] Referring Provider (PT): Dr. [name]   Encounter Date: 07/07/2020   PT End of Session - 07/07/20 1000    Visit Number 4    Number of Visits 13    Date for PT Re-Evaluation 08/18/20    Authorization Type Medicare    PT Start Time 0908    PT Stop Time 0955    PT Time Calculation (min) 47 min    Equipment Utilized During Treatment Other (comment)   held pts belt at times indicated on flowsheet   Activity Tolerance Patient tolerated treatment well    Behavior During Therapy [facility] for tasks assessed/performed           Past Medical History:  Diagnosis Date  . Allergy   . Anemia   . Arthritis   . Asthma    no treatment x 20 years  . Depression   . Double vision    occurs at times   . Duodenal ulcer   . GERD (gastroesophageal reflux disease)   . Hyperlipidemia   . Hypertension   . Hypothyroidism   . Multiple myeloma 07/04/2011  . Thyroid disease     Past Surgical History:  Procedure Laterality Date  . BONE MARROW TRANSPLANT  2011   for MM  . CARDIOLITE STUDY  11/25/2003   NORMAL  . TONSILLECTOMY      There were no vitals filed for this visit.   Subjective Assessment - 07/07/20 0914    Subjective I was a little tired after last session but not overly so. I walked on my treadmill since I was here last and for the first time I found myself letting go for short periods which I haven't been able to do for awhile.    Pertinent History Multiple myeloma not in remission with bone marrow transplant in 2011 and 2017. Current infusions of bortezomib every 21 days and zoledronate every 12 weeks.  Imaging on 04/05/20 showing stable lytic lesions bilateral humeri and femurs, skull, and left radius.  CIPN.    Patient Stated  Goals I would like to get my balance comfortable    Currently in Pain? No/denies                             [facility] Adult PT Treatment/Exercise - 07/07/20 0001      Neuro Re-ed    Neuro Re-ed Details  In // bars: Heel-toe front and retro walking 4x each with max VCs and demo throughout as pt struggled with kinesthic awareness of feet placement; toe walking, then heel walking x4 each with VCs for full ankle ROM with both; then slow, controlled high knee marching encrouaging 3 sec holds with each step, very challenging for pt; crossover in front walking Rt and Lt 2x each with max VCs and demo throughout for foot placement; walked on inclined slope of black peds mat holding pts belt: walking with cuing place heel down as pt found himself marching 2x each direction, then crossing foot over in front facing both direction on slope and pt holding therapist forearms, but less so today; then back in // bars: cones placed in bars for stepping over cones with VCs and demo for big step over, not around 4x, then bil sidestepping 2x  each with VCs to focus on foot placement of both feet, this was challenging for pt; static standing on foam rocker board to static stand x1 min side-side and then front t-back; 3 seated rest breaks during due to pt feeling fatigue; during session today less VCs to maintain COG as he demonstrated good improvement with this from last session                   PT Education - 07/07/20 1031    Education Details Balance HEP for static and dynamic activities    Person(s) Educated Patient    Methods Explanation;Demonstration;Handout    Comprehension Verbalized understanding;Returned demonstration               PT Long Term Goals - 06/23/20 1056      PT LONG TERM GOAL #1   Title Pt will improve [place] balance scale to at least 25/40 to minimize risk of falls    Baseline 20    Time 6    Period Weeks    Status New      PT LONG TERM GOAL #2   Title Pt  will be ind with final HEP with balance focus    Time 6    Period Weeks    Status New      PT LONG TERM GOAL #3   Title Pt will be able to demonstrate proper balance correction with hip and ankle movements as needed in response to perturbations    Time 6    Period Weeks    Status New                 Plan - 07/07/20 1004    Clinical Impression Statement Pt returns today reporting he could already tell some improvement since starting therapy as he found  himself briefly not holding onto to handrails briefly when on his treadmill which he hasn't been able to do for awhile now. There were some improvements noted during todays session from last session as well. He required less VCs to correct COG, and he was able to demonstrate better foot placement in general throughout session showing improved kinesthetic awareness. He does still become fatigued during session requiring seated rest breaks and needed to end session early today due to fatigue, as well as he demonstrates less control with foot placement during gait and fatigue increases.    Personal Factors and Comorbidities Age;Comorbidity 1    Comorbidities bony mets    Examination-Activity Limitations Other   walking/running   Stability/Clinical Decision Making Stable/Uncomplicated    Rehab Potential Good    PT Frequency 2x / week    PT Duration 6 weeks    PT Treatment/Interventions ADLs/Self Care Home Management;Therapeutic exercise;Patient/family education;Neuromuscular re-education    PT Next Visit Plan Cont balance work; foam, eyes closed, narrow BOS, cones, single leg, stepping strategy due to LOB posteriorly, unlevel surface (was challenged by balance activities on inclined edge of black peds mat)    PT Home Exercise Plan Balance HEP    Consulted and Agree with Plan of Care Patient           Patient will benefit from skilled therapeutic intervention in order to improve the following deficits and impairments:  Decreased  balance  Visit Diagnosis: Unsteadiness on feet     Problem List Patient Active Problem List   Diagnosis Date Noted  . Hemorrhoids 11/20/2019  . Neuropathy due to chemotherapeutic drug ([place]) 09/01/2019  . Leukopenia due to antineoplastic chemotherapy ([place])  02/22/2017  . Dry skin dermatitis 08/16/2016  . Peripheral edema 04/26/2016  . Bone marrow transplant status ([place]) 03/14/2016  . Debility 02/26/2016  . Back pain 02/26/2016  . Febrile neutropenia ([place]) 02/15/2016  . Patient in clinical research study 01/10/2016  . Rash and nonspecific skin eruption 01/06/2016  . HCAP (healthcare-associated pneumonia)   . GERD (gastroesophageal reflux disease)   . Dehydration 12/22/2015  . Weakness 12/22/2015  . Asthma exacerbation 12/22/2015  . Hyponatremia 12/22/2015  . General weakness   . Multiple myeloma not having achieved remission ([place])   . Sepsis ([place])   . Chronic anemia   . SBO (small bowel obstruction) ([place]) 11/07/2015  . Constipation   . Actinic keratosis 07/08/2015  . Transient global amnesia 06/07/2015  . Diarrhea 09/17/2014  . Raynaud's phenomenon 09/17/2013  . Hemorrhage of rectum and anus 09/15/2013  . Skin lesion, superficial 07/29/2013  . Cough 01/10/2013  . Abnormal brain MRI 10/08/2012  . Vitamin D deficiency 09/30/2012  . Hypothyroidism 09/30/2012  . Hypogonadism male 09/30/2012  . Fatigue 09/27/2012  . Insomnia 09/27/2012  . Reactive depression (situational) 09/27/2012  . Well adult exam 09/27/2012  . Decreased energy 04/18/2012  . TIA (transient ischemic attack) 03/27/2012  . Transient diplopia 03/27/2012  . HYPERLIPIDEMIA 05/30/2010  . ANEMIA 05/30/2010  . Asthma 05/30/2010  . SCHATZKI'S RING 05/30/2010  . Peptic ulcer disease 05/30/2010  . HIATAL HERNIA 05/30/2010  . SLEEP APNEA, MILD 05/30/2010  . TRANSAMINASES, SERUM, ELEVATED 05/30/2010    Otelia Limes, PTA 07/07/2020, 10:34 AM  [place], [place], [address] Phone: [phone]   Fax:  [phone]  Name: Andre [name] MRN: [id number] Date of Birth: 02-26-1951

## 2020-07-12 ENCOUNTER — Inpatient Hospital Stay: Payer: Medicare Other

## 2020-07-12 ENCOUNTER — Other Ambulatory Visit: Payer: Self-pay | Admitting: Oncology

## 2020-07-12 ENCOUNTER — Other Ambulatory Visit: Payer: Self-pay

## 2020-07-12 VITALS — BP 132/74 | HR 70 | Temp 97.8°F | Resp 18 | Wt 178.2 lb

## 2020-07-12 DIAGNOSIS — C9000 Multiple myeloma not having achieved remission: Secondary | ICD-10-CM

## 2020-07-12 DIAGNOSIS — Z9481 Bone marrow transplant status: Secondary | ICD-10-CM

## 2020-07-12 DIAGNOSIS — T451X5A Adverse effect of antineoplastic and immunosuppressive drugs, initial encounter: Secondary | ICD-10-CM

## 2020-07-12 DIAGNOSIS — G620 Drug-induced polyneuropathy: Secondary | ICD-10-CM

## 2020-07-12 DIAGNOSIS — R9089 Other abnormal findings on diagnostic imaging of central nervous system: Secondary | ICD-10-CM

## 2020-07-12 DIAGNOSIS — Z5112 Encounter for antineoplastic immunotherapy: Secondary | ICD-10-CM | POA: Diagnosis not present

## 2020-07-12 LAB — CBC WITH DIFFERENTIAL (CANCER CENTER ONLY)
Abs Immature Granulocytes: 0.01 10*3/uL (ref 0.00–0.07)
Basophils Absolute: 0 10*3/uL (ref 0.0–0.1)
Basophils Relative: 1 %
Eosinophils Absolute: 0.2 10*3/uL (ref 0.0–0.5)
Eosinophils Relative: 3 %
HCT: 33.7 % — ABNORMAL LOW (ref 39.0–52.0)
Hemoglobin: 11.5 g/dL — ABNORMAL LOW (ref 13.0–17.0)
Immature Granulocytes: 0 %
Lymphocytes Relative: 26 %
Lymphs Abs: 1.7 10*3/uL (ref 0.7–4.0)
MCH: 34.8 pg — ABNORMAL HIGH (ref 26.0–34.0)
MCHC: 34.1 g/dL (ref 30.0–36.0)
MCV: 102.1 fL — ABNORMAL HIGH (ref 80.0–100.0)
Monocytes Absolute: 0.7 10*3/uL (ref 0.1–1.0)
Monocytes Relative: 11 %
Neutro Abs: 3.9 10*3/uL (ref 1.7–7.7)
Neutrophils Relative %: 59 %
Platelet Count: 119 10*3/uL — ABNORMAL LOW (ref 150–400)
RBC: 3.3 MIL/uL — ABNORMAL LOW (ref 4.22–5.81)
RDW: 15 % (ref 11.5–15.5)
WBC Count: 6.6 10*3/uL (ref 4.0–10.5)
nRBC: 0 % (ref 0.0–0.2)

## 2020-07-12 LAB — CMP (CANCER CENTER ONLY)
ALT: 11 U/L (ref 0–44)
AST: 20 U/L (ref 15–41)
Albumin: 3.8 g/dL (ref 3.5–5.0)
Alkaline Phosphatase: 70 U/L (ref 38–126)
Anion gap: 8 (ref 5–15)
BUN: 32 mg/dL — ABNORMAL HIGH (ref 8–23)
CO2: 26 mmol/L (ref 22–32)
Calcium: 10.1 mg/dL (ref 8.9–10.3)
Chloride: 101 mmol/L (ref 98–111)
Creatinine: 1.12 mg/dL (ref 0.61–1.24)
GFR, Estimated: >60 mL/min (ref >60)
Glucose, Bld: 89 mg/dL (ref 70–99)
Potassium: 4.3 mmol/L (ref 3.5–5.1)
Sodium: 135 mmol/L (ref 135–145)
Total Bilirubin: 0.3 mg/dL (ref 0.3–1.2)
Total Protein: 7.2 g/dL (ref 6.5–8.1)

## 2020-07-12 MED ORDER — PROCHLORPERAZINE MALEATE 10 MG PO TABS: 10.0000 mg | ORAL_TABLET | Freq: Once | ORAL | Status: DC

## 2020-07-12 MED ORDER — BORTEZOMIB CHEMO SQ INJECTION 3.5 MG (2.5MG/ML)
1.3000 mg/m2 | Freq: Once | INTRAMUSCULAR | Status: AC
  Administered 2020-07-12: 2.5 mg via SUBCUTANEOUS
  Filled 2020-07-12: qty 1

## 2020-07-12 NOTE — Addendum Note (Signed)
Addended by: Manuella Ghazi on: 07/12/2020 03:41 PM   Modules accepted: Orders

## 2020-07-12 NOTE — Patient Instructions (Signed)
[facility] Discharge Instructions for Patients Receiving Chemotherapy  Today you received the following chemotherapy agents Velcade.  To help prevent nausea and vomiting after your treatment, we encourage you to take your nausea medication as directed.  If you develop nausea and vomiting that is not controlled by your nausea medication, call the clinic.   BELOW ARE SYMPTOMS THAT SHOULD BE REPORTED IMMEDIATELY:  *FEVER GREATER THAN 100.5 F  *CHILLS WITH OR WITHOUT FEVER  NAUSEA AND VOMITING THAT IS NOT CONTROLLED WITH YOUR NAUSEA MEDICATION  *UNUSUAL SHORTNESS OF BREATH  *UNUSUAL BRUISING OR BLEEDING  TENDERNESS IN MOUTH AND THROAT WITH OR WITHOUT PRESENCE OF ULCERS  *URINARY PROBLEMS  *BOWEL PROBLEMS  UNUSUAL RASH Items with * indicate a potential emergency and should be followed up as soon as possible.  Feel free to call the clinic should you have any questions or concerns. The clinic phone number is [phone].  Please show the CHEMO ALERT CARD at check-in to the Emergency Department and triage nurse.   

## 2020-07-13 ENCOUNTER — Encounter: Payer: Self-pay | Admitting: Rehabilitation

## 2020-07-13 ENCOUNTER — Ambulatory Visit: Payer: Medicare Other | Admitting: Rehabilitation

## 2020-07-13 DIAGNOSIS — R2681 Unsteadiness on feet: Secondary | ICD-10-CM

## 2020-07-13 LAB — PROTEIN ELECTROPHORESIS, SERUM
A/G Ratio: 1.2 (ref 0.7–1.7)
Albumin ELP: 3.6 g/dL (ref 2.9–4.4)
Alpha-1-Globulin: 0.2 g/dL (ref 0.0–0.4)
Alpha-2-Globulin: 0.8 g/dL (ref 0.4–1.0)
Beta Globulin: 0.9 g/dL (ref 0.7–1.3)
Gamma Globulin: 1 g/dL (ref 0.4–1.8)
Globulin, Total: 2.9 g/dL (ref 2.2–3.9)
M-Spike, %: 0.3 g/dL — ABNORMAL HIGH
Total Protein ELP: 6.5 g/dL (ref 6.0–8.5)

## 2020-07-13 LAB — KAPPA/LAMBDA LIGHT CHAINS
Kappa free light chain: 25.8 mg/L — ABNORMAL HIGH (ref 3.3–19.4)
Kappa, lambda light chain ratio: 1.1 (ref 0.26–1.65)
Lambda free light chains: 23.4 mg/L (ref 5.7–26.3)

## 2020-07-13 LAB — IGG: IgG (Immunoglobin G), Serum: 1089 mg/dL (ref 603–1613)

## 2020-07-13 NOTE — Therapy (Signed)
[place], [place], [address] Phone: [phone]   Fax:  [phone]  Physical Therapy Treatment  Patient Details  Name: Andre [name] MRN: [id number] Date of Birth: 1951-05-23 Referring Provider (PT): Dr. [name]   Encounter Date: 07/13/2020   PT End of Session - 07/13/20 1718    Visit Number 5    Number of Visits 13    Date for PT Re-Evaluation 08/18/20    Authorization Type Medicare    PT Start Time 1501    PT Stop Time 1545    PT Time Calculation (min) 44 min    Equipment Utilized During Treatment Gait belt    Activity Tolerance Patient tolerated treatment well    Behavior During Therapy [facility] for tasks assessed/performed           Past Medical History:  Diagnosis Date  . Allergy   . Anemia   . Arthritis   . Asthma    no treatment x 20 years  . Depression   . Double vision    occurs at times   . Duodenal ulcer   . GERD (gastroesophageal reflux disease)   . Hyperlipidemia   . Hypertension   . Hypothyroidism   . Multiple myeloma 07/04/2011  . Thyroid disease     Past Surgical History:  Procedure Laterality Date  . BONE MARROW TRANSPLANT  2011   for MM  . CARDIOLITE STUDY  11/25/2003   NORMAL  . TONSILLECTOMY      There were no vitals filed for this visit.   Subjective Assessment - 07/13/20 1505    Subjective I had an infusion yesterday and feel a bit quesy    Pertinent History Multiple myeloma not in remission with bone marrow transplant in 2011 and 2017. Current infusions of bortezomib every 21 days and zoledronate every 12 weeks.  Imaging on 04/05/20 showing stable lytic lesions bilateral humeri and femurs, skull, and left radius.  CIPN.    Currently in Pain? No/denies                             [facility] Adult PT Treatment/Exercise - 07/13/20 0001      Neuro Re-ed    Neuro Re-ed Details  In // bars: Heel-toe front and retro walking 4x each with vcs to  decrease speed and increase size of backward steps, side steps x 2 lengths fairly easy, slow march with cueing to keep leg elevated x 2 seconds difficult for pt to slow down but no LOB.  On airex step up x 10 bil, SL stance work 4x max bil, and tandem stance 2x max bil, slant board wooden standing balance work both directions x 60" each, Out of bars: cone weaving x 8 cones, forward and lateral step overs slightly easy with small cones, walking on edge of black pediatric mat x 2 lengths.  On rebounder: weight shifting forward and backward with education on forward lean of the trunk with rocking bakcwards, slow march x 5, and EC x 10 seconds with consistent LOB posteriorly and to the left.  No fatigue noted today and pt did not require rest not led by PT.                         PT Long Term Goals - 06/23/20 1056      PT LONG TERM GOAL #1   Title Pt will improve  [place] balance scale to at least 25/40 to minimize risk of falls    Baseline 20    Time 6    Period Weeks    Status New      PT LONG TERM GOAL #2   Title Pt will be ind with final HEP with balance focus    Time 6    Period Weeks    Status New      PT LONG TERM GOAL #3   Title Pt will be able to demonstrate proper balance correction with hip and ankle movements as needed in response to perturbations    Time 6    Period Weeks    Status New                 Plan - 07/13/20 1719    Clinical Impression Statement Pt continues having difficutly with slow movements and complex movements also seeming to forget what the instructions are after distraction which pt even noted he has trouble with.  Continues to need gait belt stabilization especially with eyes closed and rocking posteriorly onthe rebounder or rocker board.  No additional rest break was needed which is an improvement.    PT Frequency 2x / week    PT Duration 6 weeks    PT Treatment/Interventions ADLs/Self Care Home Management;Therapeutic  exercise;Patient/family education;Neuromuscular re-education    PT Next Visit Plan Cont balance work; foam, eyes closed, narrow BOS, cones, single leg, stepping strategy due to LOB posteriorly, unlevel surface (was challenged by balance activities on inclined edge of black peds mat)    Consulted and Agree with Plan of Care Patient           Patient will benefit from skilled therapeutic intervention in order to improve the following deficits and impairments:     Visit Diagnosis: Unsteadiness on feet     Problem List Patient Active Problem List   Diagnosis Date Noted  . Hemorrhoids 11/20/2019  . Neuropathy due to chemotherapeutic drug ([place]) 09/01/2019  . Leukopenia due to antineoplastic chemotherapy ([place]) 02/22/2017  . Dry skin dermatitis 08/16/2016  . Peripheral edema 04/26/2016  . Bone marrow transplant status ([place]) 03/14/2016  . Debility 02/26/2016  . Back pain 02/26/2016  . Febrile neutropenia ([place]) 02/15/2016  . Patient in clinical research study 01/10/2016  . Rash and nonspecific skin eruption 01/06/2016  . HCAP (healthcare-associated pneumonia)   . GERD (gastroesophageal reflux disease)   . Dehydration 12/22/2015  . Weakness 12/22/2015  . Asthma exacerbation 12/22/2015  . Hyponatremia 12/22/2015  . General weakness   . Multiple myeloma not having achieved remission ([place])   . Sepsis ([place])   . Chronic anemia   . SBO (small bowel obstruction) ([place]) 11/07/2015  . Constipation   . Actinic keratosis 07/08/2015  . Transient global amnesia 06/07/2015  . Diarrhea 09/17/2014  . Raynaud's phenomenon 09/17/2013  . Hemorrhage of rectum and anus 09/15/2013  . Skin lesion, superficial 07/29/2013  . Cough 01/10/2013  . Abnormal brain MRI 10/08/2012  . Vitamin D deficiency 09/30/2012  . Hypothyroidism 09/30/2012  . Hypogonadism male 09/30/2012  . Fatigue 09/27/2012  . Insomnia 09/27/2012  . Reactive depression (situational) 09/27/2012  . Well adult exam 09/27/2012  .  Decreased energy 04/18/2012  . TIA (transient ischemic attack) 03/27/2012  . Transient diplopia 03/27/2012  . HYPERLIPIDEMIA 05/30/2010  . ANEMIA 05/30/2010  . Asthma 05/30/2010  . SCHATZKI'S RING 05/30/2010  . Peptic ulcer disease 05/30/2010  . HIATAL HERNIA 05/30/2010  . SLEEP APNEA, MILD 05/30/2010  .  TRANSAMINASES, SERUM, ELEVATED 05/30/2010    Andre [name] 07/13/2020, 5:21 PM  [place], [place], [address] Phone: [phone]   Fax:  [phone]  Name: Andre [name] MRN: [id number] Date of Birth: [birth year]

## 2020-07-16 ENCOUNTER — Other Ambulatory Visit: Payer: Self-pay

## 2020-07-16 ENCOUNTER — Ambulatory Visit: Payer: Medicare Other | Admitting: Rehabilitation

## 2020-07-16 ENCOUNTER — Encounter: Payer: Self-pay | Admitting: Rehabilitation

## 2020-07-16 DIAGNOSIS — R2681 Unsteadiness on feet: Secondary | ICD-10-CM

## 2020-07-16 NOTE — Therapy (Signed)
Andre [name], [place], [address] Phone: [phone]   Fax:  [phone]  Physical Therapy Treatment  Patient Details  Name: Andre [name] MRN: [id number] Date of Birth: 07/19/1951 Referring Provider (PT): Dr. [name]   Encounter Date: 07/16/2020   PT End of Session - 07/16/20 0848    Visit Number 6    Number of Visits 13    Date for PT Re-Evaluation 08/18/20    PT Start Time 0800    PT Stop Time 0833    PT Time Calculation (min) 33 min    Equipment Utilized During Treatment Gait belt    Activity Tolerance Patient tolerated treatment well;Patient limited by fatigue    Behavior During Therapy [facility] for tasks assessed/performed           Past Medical History:  Diagnosis Date   Allergy    Anemia    Arthritis    Asthma    no treatment x 20 years   Depression    Double vision    occurs at times    Duodenal ulcer    GERD (gastroesophageal reflux disease)    Hyperlipidemia    Hypertension    Hypothyroidism    Multiple myeloma 07/04/2011   Thyroid disease     Past Surgical History:  Procedure Laterality Date   BONE MARROW TRANSPLANT  2011   for MM   CARDIOLITE STUDY  11/25/2003   NORMAL   TONSILLECTOMY      There were no vitals filed for this visit.   Subjective Assessment - 07/16/20 0845    Subjective I did about 81mn of strength work before this I usually do 30 but I just didn't feel like it    Pertinent History Multiple myeloma not in remission with bone marrow transplant in 2011 and 2017. Current infusions of bortezomib every 21 days and zoledronate every 12 weeks.  Imaging on 04/05/20 showing stable lytic lesions bilateral humeri and femurs, skull, and left radius.  CIPN.    Currently in Pain? No/denies                             [facility] - [facility]      Neuro Re-ed    Neuro Re-ed Details  In // bars: Heel-toe  front and retro walking 4x each with vcs to decrease speed and increase size of backward steps, side steps x 2 lengths fairly easy, slow march forward and backward with cueing to keep leg elevated x 2 seconds difficult for pt to slow down and occasional LOB backwards with backwards march.  On airex SL stance work 4x max bil, EC 15" x 3, and head turns right and left all holding gait belt throughout.  On ground single leg stance work, tandem stance work, and heelraises/toe raises 2x5 .  Using black unstable surface squares mats x 2 and one blue foam step across 2 forward and 1 lateral each direction                       PT Long Term Goals - 06/23/20 1056      PT LONG TERM GOAL #1   Title Pt will improve [place] balance scale to at least 25/40 to minimize risk of falls    Baseline 20    Time 6    Period Weeks    Status New  PT LONG TERM GOAL #2   Title Pt will be ind with final HEP with balance focus    Time 6    Period Weeks    Status New      PT LONG TERM GOAL #3   Title Pt will be able to demonstrate proper balance correction with hip and ankle movements as needed in response to perturbations    Time 6    Period Weeks    Status New                 Plan - 07/16/20 0849    Clinical Impression Statement Pt demonstrated improved stable ground balance work today and is also improving dynamic stability but pt is on day 4 post infusion and presents very fatigued and tired.  Pt willing to do activities but PT stopped after 76mnutes to allow for energy conservation and encouraged pt that 361m is great.    PT Frequency 2x / week    PT Duration 6 weeks    PT Treatment/Interventions ADLs/Self Care Home Management;Therapeutic exercise;Patient/family education;Neuromuscular re-education    PT Next Visit Plan Cont balance work; foam, eyes closed, narrow BOS, cones, single leg, stepping strategy due to LOB posteriorly, unlevel surface (was challenged by balance  activities on inclined edge of black peds mat)    Consulted and Agree with Plan of Care Patient           Patient will benefit from skilled therapeutic intervention in order to improve the following deficits and impairments:     Visit Diagnosis: Unsteadiness on feet     Problem List Patient Active Problem List   Diagnosis Date Noted   Hemorrhoids 11/20/2019   Neuropathy due to chemotherapeutic drug ([facility] [place]12/14/2020   Leukopenia due to antineoplastic chemotherapy (HCM[facility]06/03/2017   Dry skin dermatitis 08/16/2016   Peripheral edema 04/26/2016   Bone marrow transplant status ([facility]CE[place]06/27/2017   Debility 02/26/2016   Back pain 02/26/2016   Febrile neutropenia ([facility]05/30/2017   Patient in clinical research study 01/10/2016   Rash and nonspecific skin eruption 01/06/2016   HCAP (healthcare-associated pneumonia)    GERD (gastroesophageal reflux disease)    Dehydration 12/22/2015   Weakness 12/22/2015   Asthma exacerbation 12/22/2015   Hyponatremia 12/22/2015   General weakness    Multiple myeloma not having achieved remission (HCF[facility] [place]   Sepsis (H[facility]   Chronic anemia    SBO (small bowel obstruction) (HCWater[place]02/19/2017   Constipation    Actinic keratosis 07/08/2015   Transient global amnesia 06/07/2015   Diarrhea 09/17/2014   Raynaud's phenomenon 09/17/2013   Hemorrhage of rectum and anus 09/15/2013   Skin lesion, superficial 07/29/2013   Cough 01/10/2013   Abnormal brain MRI 10/08/2012   Vitamin D deficiency 09/30/2012   Hypothyroidism 09/30/2012   Hypogonadism male 09/30/2012   Fatigue 09/27/2012   Insomnia 09/27/2012   Reactive depression (situational) 09/27/2012   Well adult exam 09/27/2012   Decreased energy 04/18/2012   TIA (transient ischemic attack) 03/27/2012   Transient diplopia 03/27/2012   HYPERLIPIDEMIA 05/30/2010   ANEMIA 05/30/2010   Asthma 05/30/2010   SCHATZKI'S RING 05/30/2010   Peptic ulcer disease  05/30/2010   HIATAL HERNIA 05/30/2010   SLEEP APNEA, MILD 05/30/2010   TRANSAMINASES, SERUM, ELEVATED 05/30/2010    TeStark Bray0/29/2021, 8:52 AM  [id number]: [phone] Fax:  [phone]: Andre Lynford EspinozaRN: [id number] of  Birth: 1951/06/07

## 2020-07-19 ENCOUNTER — Ambulatory Visit: Payer: Medicare Other | Attending: Oncology

## 2020-07-19 ENCOUNTER — Other Ambulatory Visit: Payer: Self-pay

## 2020-07-19 DIAGNOSIS — R2681 Unsteadiness on feet: Secondary | ICD-10-CM | POA: Diagnosis present

## 2020-07-19 NOTE — Therapy (Signed)
[place], [place], [address] Phone: [phone]   Fax:  [phone]  Physical Therapy Treatment  Patient Details  Name: Andre [name] MRN: [id number] Date of Birth: Oct 11, 1950 Referring Provider (PT): Dr. [name]   Encounter Date: 07/19/2020   PT End of Session - 07/19/20 1216    Visit Number 7    Number of Visits 13    Date for PT Re-Evaluation 08/18/20    Authorization Type Medicare    PT Start Time 1008    PT Stop Time 1059    PT Time Calculation (min) 51 min    Activity Tolerance Patient tolerated treatment well    Behavior During Therapy [facility] for tasks assessed/performed           Past Medical History:  Diagnosis Date  . Allergy   . Anemia   . Arthritis   . Asthma    no treatment x 20 years  . Depression   . Double vision    occurs at times   . Duodenal ulcer   . GERD (gastroesophageal reflux disease)   . Hyperlipidemia   . Hypertension   . Hypothyroidism   . Multiple myeloma 07/04/2011  . Thyroid disease     Past Surgical History:  Procedure Laterality Date  . BONE MARROW TRANSPLANT  2011   for MM  . CARDIOLITE STUDY  11/25/2003   NORMAL  . TONSILLECTOMY      There were no vitals filed for this visit.   Subjective Assessment - 07/19/20 1012    Subjective I was able to work out for an hour this morning. I can tell my balance is improving. I can go for short spurts without holding on when on the treadmill. Also haven't fallen since I've started therapy and haven't felt like I was going to fall in the past 1-2 weeks.    Pertinent History Multiple myeloma not in remission with bone marrow transplant in 2011 and 2017. Current infusions of bortezomib every 21 days and zoledronate every 12 weeks.  Imaging on 04/05/20 showing stable lytic lesions bilateral humeri and femurs, skull, and left radius.  CIPN.    Patient Stated Goals I would like to get my balance comfortable     Currently in Pain? No/denies                             [facility] Adult PT Treatment/Exercise - 07/19/20 0001      Neuro Re-ed    Neuro Re-ed Details  ; then back in // bars on Airex for ball toss x3 minutes, then still on Airex for bil SLS and then tandem stance x1 min each encouraging min HHA throughout.                       PT Long Term Goals - 06/23/20 1056      PT LONG TERM GOAL #1   Title Pt will improve [place] balance scale to at least 25/40 to minimize risk of falls    Baseline 20    Time 6    Period Weeks    Status New      PT LONG TERM GOAL #2   Title Pt will be ind with final HEP with balance focus    Time 6    Period Weeks    Status New      PT LONG TERM GOAL #3  Title Pt will be able to demonstrate proper balance correction with hip and ankle movements as needed in response to perturbations    Time 6    Period Weeks    Status New                 Plan - 07/19/20 1217    Clinical Impression Statement Pt continues to demonstrate improvement with higher level static and dynamic balance. He does still show unsteadiness on feet when on unlevel surface and when becoming more fatigued. Also still benefits from VCs for slower pace with higher level dynamic activities and to be more proactive with foot placement. Pt is very motivated and does very well during sessions, also with working on HEP and his exercise routine at home. His energy had returned to near normal since infusion about a week ago as he reports was able to do his nromal 1 hr routine this morning but did require 3-4 seated rest breaks during therapy. Overall good progress is noted, but pt will benefit from continued focus on higher level static and dynamic balance work.    Personal Factors and Comorbidities Age;Comorbidity 1    Comorbidities bony mets    Examination-Activity Limitations Other   walking/running   Stability/Clinical Decision Making Stable/Uncomplicated     Rehab Potential Good    PT Frequency 2x / week    PT Duration 6 weeks    PT Treatment/Interventions ADLs/Self Care Home Management;Therapeutic exercise;Patient/family education;Neuromuscular re-education    PT Next Visit Plan Cont balance work; foam, eyes closed, narrow BOS, cones, single leg, stepping strategy due to LOB posteriorly, unlevel surface (conts to be challenged by balance activities on inclined edge of black peds mat)    PT Home Exercise Plan Balance HEP    Consulted and Agree with Plan of Care Patient           Patient will benefit from skilled therapeutic intervention in order to improve the following deficits and impairments:  Decreased balance  Visit Diagnosis: Unsteadiness on feet     Problem List Patient Active Problem List   Diagnosis Date Noted  . Hemorrhoids 11/20/2019  . Neuropathy due to chemotherapeutic drug ([place]) 09/01/2019  . Leukopenia due to antineoplastic chemotherapy ([place]) 02/22/2017  . Dry skin dermatitis 08/16/2016  . Peripheral edema 04/26/2016  . Bone marrow transplant status ([place]) 03/14/2016  . Debility 02/26/2016  . Back pain 02/26/2016  . Febrile neutropenia ([place]) 02/15/2016  . Patient in clinical research study 01/10/2016  . Rash and nonspecific skin eruption 01/06/2016  . HCAP (healthcare-associated pneumonia)   . GERD (gastroesophageal reflux disease)   . Dehydration 12/22/2015  . Weakness 12/22/2015  . Asthma exacerbation 12/22/2015  . Hyponatremia 12/22/2015  . General weakness   . Multiple myeloma not having achieved remission ([place])   . Sepsis ([place])   . Chronic anemia   . SBO (small bowel obstruction) ([place]) 11/07/2015  . Constipation   . Actinic keratosis 07/08/2015  . Transient global amnesia 06/07/2015  . Diarrhea 09/17/2014  . Raynaud's phenomenon 09/17/2013  . Hemorrhage of rectum and anus 09/15/2013  . Skin lesion, superficial 07/29/2013  . Cough 01/10/2013  . Abnormal brain MRI 10/08/2012  . Vitamin D deficiency  09/30/2012  . Hypothyroidism 09/30/2012  . Hypogonadism male 09/30/2012  . Fatigue 09/27/2012  . Insomnia 09/27/2012  . Reactive depression (situational) 09/27/2012  . Well adult exam 09/27/2012  . Decreased energy 04/18/2012  . TIA (transient ischemic attack) 03/27/2012  . Transient diplopia 03/27/2012  .  HYPERLIPIDEMIA 05/30/2010  . ANEMIA 05/30/2010  . Asthma 05/30/2010  . SCHATZKI'S RING 05/30/2010  . Peptic ulcer disease 05/30/2010  . HIATAL HERNIA 05/30/2010  . SLEEP APNEA, MILD 05/30/2010  . TRANSAMINASES, SERUM, ELEVATED 05/30/2010    Otelia Limes, PTA 07/19/2020, 12:25 PM  [place], [place], [address] Phone: [phone]   Fax:  [phone]  Name: Andre [name] MRN: [id number] Date of Birth: 1951/03/28

## 2020-07-22 ENCOUNTER — Other Ambulatory Visit: Payer: Self-pay

## 2020-07-22 ENCOUNTER — Encounter: Payer: Self-pay | Admitting: Rehabilitation

## 2020-07-22 ENCOUNTER — Ambulatory Visit: Payer: Medicare Other | Admitting: Rehabilitation

## 2020-07-22 DIAGNOSIS — R2681 Unsteadiness on feet: Secondary | ICD-10-CM

## 2020-07-22 NOTE — Therapy (Signed)
[place], [place], [address] Phone: [phone]   Fax:  [phone]  Physical Therapy Treatment  Patient Details  Name: Andre [name] MRN: [id number] Date of Birth: October 30, 1950 Referring Provider (PT): Dr. [name]   Encounter Date: 07/22/2020   PT End of Session - 07/22/20 1047    Visit Number 8    Number of Visits 13    Date for PT Re-Evaluation 08/18/20    PT Start Time 1000    PT Stop Time 1038    PT Time Calculation (min) 38 min    Activity Tolerance Patient tolerated treatment well    Behavior During Therapy [facility] for tasks assessed/performed           Past Medical History:  Diagnosis Date  . Allergy   . Anemia   . Arthritis   . Asthma    no treatment x 20 years  . Depression   . Double vision    occurs at times   . Duodenal ulcer   . GERD (gastroesophageal reflux disease)   . Hyperlipidemia   . Hypertension   . Hypothyroidism   . Multiple myeloma 07/04/2011  . Thyroid disease     Past Surgical History:  Procedure Laterality Date  . BONE MARROW TRANSPLANT  2011   for MM  . CARDIOLITE STUDY  11/25/2003   NORMAL  . TONSILLECTOMY      There were no vitals filed for this visit.   Subjective Assessment - 07/22/20 1041    Subjective I walked x 1 hour htis morning and did not have to hold on the whole time. I also put my socks on without sitting down    Pertinent History Multiple myeloma not in remission with bone marrow transplant in 2011 and 2017. Current infusions of bortezomib every 21 days and zoledronate every 12 weeks.  Imaging on 04/05/20 showing stable lytic lesions bilateral humeri and femurs, skull, and left radius.  CIPN.    Currently in Pain? No/denies                             [facility] Adult PT Treatment/Exercise - 07/22/20 0001      Neuro Re-ed    Neuro Re-ed Details  Performed [place] balance scale today which pt scored 32/40 compared to  20/40 on evaluation now improved past the high fall risk category; Then in parallel bars: feet together head turns R/L x 10, tandem stance work 2x max bil, heel/toe rocking x 10, cone weaving and then step overs forward and lateral, walking on edge of peds mat forward, and sideways facing up and down hill                       PT Long Term Goals - 07/22/20 1049      PT LONG TERM GOAL #1   Title Pt will improve [place] balance scale to at least 25/40 to minimize risk of falls    Baseline 32/40    Status Achieved      PT LONG TERM GOAL #2   Title Pt will be ind with final HEP with balance focus    Status On-going      PT LONG TERM GOAL #3   Title Pt will be able to demonstrate proper balance correction with hip and ankle movements as needed in response to perturbations    Status Achieved  Plan - 07/22/20 1047    Clinical Impression Statement Pt was very stable today without LOB on all activities and performing them quickly and easily.  CGA with gait belt still used just in case but no LOB.  Pt shows significant improvement with [place]erton scoring way over the minimal detectable change level.  Pt still struggles with fatigue and poorer balance post infusion and narrow BOS work    PT Frequency 2x / week    PT Duration 6 weeks    PT Treatment/Interventions ADLs/Self Care Home Management;Therapeutic exercise;Patient/family education;Neuromuscular re-education    PT Next Visit Plan Cont balance work; foam, eyes closed, narrow BOS, cones, single leg, stepping strategy due to LOB posteriorly, unlevel surface (conts to be challenged by balance activities on inclined edge of black peds mat)    Consulted and Agree with Plan of Care Patient           Patient will benefit from skilled therapeutic intervention in order to improve the following deficits and impairments:     Visit Diagnosis: Unsteadiness on feet     Problem List Patient Active Problem List    Diagnosis Date Noted  . Hemorrhoids 11/20/2019  . Neuropathy due to chemotherapeutic drug ([place]) 09/01/2019  . Leukopenia due to antineoplastic chemotherapy ([place]) 02/22/2017  . Dry skin dermatitis 08/16/2016  . Peripheral edema 04/26/2016  . Bone marrow transplant status ([place]) 03/14/2016  . Debility 02/26/2016  . Back pain 02/26/2016  . Febrile neutropenia ([place]) 02/15/2016  . Patient in clinical research study 01/10/2016  . Rash and nonspecific skin eruption 01/06/2016  . HCAP (healthcare-associated pneumonia)   . GERD (gastroesophageal reflux disease)   . Dehydration 12/22/2015  . Weakness 12/22/2015  . Asthma exacerbation 12/22/2015  . Hyponatremia 12/22/2015  . General weakness   . Multiple myeloma not having achieved remission ([place])   . Sepsis ([place])   . Chronic anemia   . SBO (small bowel obstruction) ([place]) 11/07/2015  . Constipation   . Actinic keratosis 07/08/2015  . Transient global amnesia 06/07/2015  . Diarrhea 09/17/2014  . Raynaud's phenomenon 09/17/2013  . Hemorrhage of rectum and anus 09/15/2013  . Skin lesion, superficial 07/29/2013  . Cough 01/10/2013  . Abnormal brain MRI 10/08/2012  . Vitamin D deficiency 09/30/2012  . Hypothyroidism 09/30/2012  . Hypogonadism male 09/30/2012  . Fatigue 09/27/2012  . Insomnia 09/27/2012  . Reactive depression (situational) 09/27/2012  . Well adult exam 09/27/2012  . Decreased energy 04/18/2012  . TIA (transient ischemic attack) 03/27/2012  . Transient diplopia 03/27/2012  . HYPERLIPIDEMIA 05/30/2010  . ANEMIA 05/30/2010  . Asthma 05/30/2010  . SCHATZKI'S RING 05/30/2010  . Peptic ulcer disease 05/30/2010  . HIATAL HERNIA 05/30/2010  . SLEEP APNEA, MILD 05/30/2010  . TRANSAMINASES, SERUM, ELEVATED 05/30/2010    Stark Bray 07/22/2020, 10:50 AM  [place], [place], [address] Phone: [phone]   Fax:  [phone]  Name: Guage Efferson MRN: [id number] Date of Birth: February 24, 1951

## 2020-07-26 ENCOUNTER — Other Ambulatory Visit: Payer: Self-pay

## 2020-07-26 ENCOUNTER — Ambulatory Visit: Payer: Medicare Other

## 2020-07-26 DIAGNOSIS — R2681 Unsteadiness on feet: Secondary | ICD-10-CM

## 2020-07-26 NOTE — Therapy (Signed)
[place], [place], [address] Phone: [phone]   Fax:  [phone]  Physical Therapy Treatment  Patient Details  Name: Andre [name] MRN: [id number] Date of Birth: Jun 01, 1951 Referring Provider (PT): Dr. [name]   Encounter Date: 07/26/2020   PT End of Session - 07/26/20 1045    Visit Number 9    Number of Visits 13    Date for PT Re-Evaluation 08/18/20    Authorization Type Medicare    PT Start Time 0354    PT Stop Time 1047    PT Time Calculation (min) 45 min    Activity Tolerance Patient tolerated treatment well    Behavior During Therapy [facility] for tasks assessed/performed           Past Medical History:  Diagnosis Date  . Allergy   . Anemia   . Arthritis   . Asthma    no treatment x 20 years  . Depression   . Double vision    occurs at times   . Duodenal ulcer   . GERD (gastroesophageal reflux disease)   . Hyperlipidemia   . Hypertension   . Hypothyroidism   . Multiple myeloma 07/04/2011  . Thyroid disease     Past Surgical History:  Procedure Laterality Date  . BONE MARROW TRANSPLANT  2011   for MM  . CARDIOLITE STUDY  11/25/2003   NORMAL  . TONSILLECTOMY      There were no vitals filed for this visit.   Subjective Assessment - 07/26/20 1007    Subjective I exercised a little much today and just feel a little more tired than usual.    Pertinent History Multiple myeloma not in remission with bone marrow transplant in 2011 and 2017. Current infusions of bortezomib every 21 days and zoledronate every 12 weeks.  Imaging on 04/05/20 showing stable lytic lesions bilateral humeri and femurs, skull, and left radius.  CIPN.    Patient Stated Goals I would like to get my balance comfortable    Currently in Pain? No/denies                             [facility] Adult PT Treatment/Exercise - 07/26/20 0001      Neuro Re-ed    Neuro Re-ed Details  In // bars:  Heel-toe front and retro wallking 2x each with eyes closed and fingertips on bars, then slow, controlled high knee marching with VCs for both activities for slower pace and control foot placement; seated rest then [place] with returning therapist demo throughout 2x each direction with VCs for foot placement; seated rest then worked on inclined edge of black peds mat for following with CGA-min A throughout: Heel-toe front walking 1x each direction, then crossover in front 2x each side and both directions of incline; seated rest then slow, controlled high knee marching; seated rest then back in // bars for static standing balance on Soft Board with regular stance and then narrow BOS 2 x 1 min , then bil SLS on green oval 2 x 1 min each with +1 HHA prn throughout and seated rest between first and second set                       PT Long Term Goals - 07/22/20 1049      PT LONG TERM GOAL #1   Title Pt will improve [place] balance scale to at  least 25/40 to minimize risk of falls    Baseline 32/40    Status Achieved      PT LONG TERM GOAL #2   Title Pt will be ind with final HEP with balance focus    Status On-going      PT LONG TERM GOAL #3   Title Pt will be able to demonstrate proper balance correction with hip and ankle movements as needed in response to perturbations    Status Achieved                 Plan - 07/26/20 1045    Clinical Impression Statement Pt continues to be challenged by static and dynamic balance activiites but also continues to show improvement with better control of foot placement and requiring less cuing for slower pace until he becomes more fatigued.  He will benefit from continued physical therapy at this time to work to further improve his balance with higher level balance activities.    Personal Factors and Comorbidities Age;Comorbidity 1    Comorbidities bony mets    Examination-Activity Limitations Other   walking/running   Stability/Clinical  Decision Making Stable/Uncomplicated    Rehab Potential Good    PT Frequency 2x / week    PT Duration 6 weeks    PT Treatment/Interventions ADLs/Self Care Home Management;Therapeutic exercise;Patient/family education;Neuromuscular re-education    PT Next Visit Plan Cont balance work; foam, eyes closed, narrow BOS, cones, single leg, stepping strategy due to LOB posteriorly, unlevel surface (conts to be challenged by balance activities on inclined edge of black peds mat)    PT Home Exercise Plan Balance HEP    Consulted and Agree with Plan of Care Patient           Patient will benefit from skilled therapeutic intervention in order to improve the following deficits and impairments:  Decreased balance  Visit Diagnosis: Unsteadiness on feet     Problem List Patient Active Problem List   Diagnosis Date Noted  . Hemorrhoids 11/20/2019  . Neuropathy due to chemotherapeutic drug ([place]) 09/01/2019  . Leukopenia due to antineoplastic chemotherapy ([place]) 02/22/2017  . Dry skin dermatitis 08/16/2016  . Peripheral edema 04/26/2016  . Bone marrow transplant status ([place]) 03/14/2016  . Debility 02/26/2016  . Back pain 02/26/2016  . Febrile neutropenia ([place]) 02/15/2016  . Patient in clinical research study 01/10/2016  . Rash and nonspecific skin eruption 01/06/2016  . HCAP (healthcare-associated pneumonia)   . GERD (gastroesophageal reflux disease)   . Dehydration 12/22/2015  . Weakness 12/22/2015  . Asthma exacerbation 12/22/2015  . Hyponatremia 12/22/2015  . General weakness   . Multiple myeloma not having achieved remission ([place])   . Sepsis ([place])   . Chronic anemia   . SBO (small bowel obstruction) ([place]) 11/07/2015  . Constipation   . Actinic keratosis 07/08/2015  . Transient global amnesia 06/07/2015  . Diarrhea 09/17/2014  . Raynaud's phenomenon 09/17/2013  . Hemorrhage of rectum and anus 09/15/2013  . Skin lesion, superficial 07/29/2013  . Cough 01/10/2013  . Abnormal brain  MRI 10/08/2012  . Vitamin D deficiency 09/30/2012  . Hypothyroidism 09/30/2012  . Hypogonadism male 09/30/2012  . Fatigue 09/27/2012  . Insomnia 09/27/2012  . Reactive depression (situational) 09/27/2012  . Well adult exam 09/27/2012  . Decreased energy 04/18/2012  . TIA (transient ischemic attack) 03/27/2012  . Transient diplopia 03/27/2012  . HYPERLIPIDEMIA 05/30/2010  . ANEMIA 05/30/2010  . Asthma 05/30/2010  . SCHATZKI'S RING 05/30/2010  . Peptic ulcer disease 05/30/2010  .  HIATAL HERNIA 05/30/2010  . SLEEP APNEA, MILD 05/30/2010  . TRANSAMINASES, SERUM, ELEVATED 05/30/2010    Otelia Limes, PTA 07/26/2020, 10:54 AM  [place], [place], [address] Phone: [phone]   Fax:  [phone]  Name: Andre [name] MRN: [id number] Date of Birth: [birth year]

## 2020-07-29 ENCOUNTER — Other Ambulatory Visit: Payer: Self-pay

## 2020-07-29 ENCOUNTER — Ambulatory Visit: Payer: Medicare Other | Admitting: Rehabilitation

## 2020-07-29 ENCOUNTER — Encounter: Payer: Self-pay | Admitting: Rehabilitation

## 2020-07-29 DIAGNOSIS — R2681 Unsteadiness on feet: Secondary | ICD-10-CM | POA: Diagnosis not present

## 2020-07-29 NOTE — Therapy (Addendum)
[place], [place], [address] Phone: [phone]   Fax:  [phone]  Physical Therapy Treatment  Patient Details  Name: Andre [name] MRN: [id number] Date of Birth: 30-Apr-1951 Referring Provider (PT): Dr. [name]   Encounter Date: 07/29/2020   PT End of Session - 07/29/20 1043    Visit Number 10    Number of Visits 13    Date for PT Re-Evaluation 08/18/20    PT Start Time 9935    PT Stop Time 1038    PT Time Calculation (min) 36 min    Activity Tolerance Patient tolerated treatment well    Behavior During Therapy [facility] for tasks assessed/performed           Past Medical History:  Diagnosis Date  . Allergy   . Anemia   . Arthritis   . Asthma    no treatment x 20 years  . Depression   . Double vision    occurs at times   . Duodenal ulcer   . GERD (gastroesophageal reflux disease)   . Hyperlipidemia   . Hypertension   . Hypothyroidism   . Multiple myeloma 07/04/2011  . Thyroid disease     Past Surgical History:  Procedure Laterality Date  . BONE MARROW TRANSPLANT  2011   for MM  . CARDIOLITE STUDY  11/25/2003   NORMAL  . TONSILLECTOMY      There were no vitals filed for this visit.   Subjective Assessment - 07/29/20 1037    Subjective I walked for an hour today already 37mn with no hands    Pertinent History Multiple myeloma not in remission with bone marrow transplant in 2011 and 2017. Current infusions of bortezomib every 21 days and zoledronate every 12 weeks.  Imaging on 04/05/20 showing stable lytic lesions bilateral humeri and femurs, skull, and left radius.  CIPN.    Currently in Pain? No/denies                             [facility] - [facility]      Neuro Re-ed    Neuro Re-ed Details  Went over OTAGO program with education on safelty and circling appropriate exercises with performance of each in the parallel bars.  Gave pt  handout for CDC checklist for falls.  Then in parallel bars: foam SL stance 2x max bil, EC 4x20sec with CGA,  then wobble board stationary hold 2x30 sec and rocking back and forth x 10 with CGA.                         PT Long Term Goals - 07/29/20 1055      PT LONG TERM GOAL #1   Title Pt will improve [place] balance scale to at least 25/40 to minimize risk of falls    Status Achieved      PT LONG TERM GOAL #2   Title Pt will be ind with final HEP with balance focus    Status Achieved      PT LONG TERM GOAL #3   Title Pt will be able to demonstrate proper balance correction with hip and ankle movements as needed in response to perturbations    Status Achieved                 Plan - 07/29/20 1052    Clinical Impression Statement Pt is  ready for [place] today having met all goals with education on continuing home balance work safely and how to be not so active post infusion.    PT Frequency 2x / week    PT Duration 6 weeks    PT Treatment/Interventions ADLs/Self Care Home Management;Therapeutic exercise;Patient/family education;Neuromuscular re-education    PT Next Visit Plan Cont balance work; foam, eyes closed, narrow BOS, cones, single leg, stepping strategy due to LOB posteriorly, unlevel surface (conts to be challenged by balance activities on inclined edge of black peds mat)    Consulted and Agree with Plan of Care Patient           Patient will benefit from skilled therapeutic intervention in order to improve the following deficits and impairments:     Visit Diagnosis: Unsteadiness on feet     Problem List Patient Active Problem List   Diagnosis Date Noted  . Hemorrhoids 11/20/2019  . Neuropathy due to chemotherapeutic drug ([place]) 09/01/2019  . Leukopenia due to antineoplastic chemotherapy ([place]) 02/22/2017  . Dry skin dermatitis 08/16/2016  . Peripheral edema 04/26/2016  . Bone marrow transplant status ([place]) 03/14/2016  . Debility  02/26/2016  . Back pain 02/26/2016  . Febrile neutropenia ([place]) 02/15/2016  . Patient in clinical research study 01/10/2016  . Rash and nonspecific skin eruption 01/06/2016  . HCAP (healthcare-associated pneumonia)   . GERD (gastroesophageal reflux disease)   . Dehydration 12/22/2015  . Weakness 12/22/2015  . Asthma exacerbation 12/22/2015  . Hyponatremia 12/22/2015  . General weakness   . Multiple myeloma not having achieved remission ([place])   . Sepsis ([place])   . Chronic anemia   . SBO (small bowel obstruction) ([place]) 11/07/2015  . Constipation   . Actinic keratosis 07/08/2015  . Transient global amnesia 06/07/2015  . Diarrhea 09/17/2014  . Raynaud's phenomenon 09/17/2013  . Hemorrhage of rectum and anus 09/15/2013  . Skin lesion, superficial 07/29/2013  . Cough 01/10/2013  . Abnormal brain MRI 10/08/2012  . Vitamin D deficiency 09/30/2012  . Hypothyroidism 09/30/2012  . Hypogonadism male 09/30/2012  . Fatigue 09/27/2012  . Insomnia 09/27/2012  . Reactive depression (situational) 09/27/2012  . Well adult exam 09/27/2012  . Decreased energy 04/18/2012  . TIA (transient ischemic attack) 03/27/2012  . Transient diplopia 03/27/2012  . HYPERLIPIDEMIA 05/30/2010  . ANEMIA 05/30/2010  . Asthma 05/30/2010  . SCHATZKI'S RING 05/30/2010  . Peptic ulcer disease 05/30/2010  . HIATAL HERNIA 05/30/2010  . SLEEP APNEA, MILD 05/30/2010  . TRANSAMINASES, SERUM, ELEVATED 05/30/2010    Stark Bray 07/29/2020, 10:56 AM  [place], [place], [address] Phone: [phone]   Fax:  [phone]  Name: Andre [name] MRN: [id number] Date of Birth: December 01, 1950  PHYSICAL THERAPY DISCHARGE SUMMARY  Visits from Start of Care: 10  Current functional level related to goals / functional outcomes: See above   Remaining deficits: See above    Education / Equipment: See above  Plan: Patient agrees to  discharge.  Patient goals were met. Patient is being discharged due to meeting the stated rehab goals.  ?????     Shan Levans, PT

## 2020-08-02 ENCOUNTER — Inpatient Hospital Stay: Payer: Medicare Other

## 2020-08-02 ENCOUNTER — Other Ambulatory Visit: Payer: Self-pay

## 2020-08-02 ENCOUNTER — Inpatient Hospital Stay: Payer: Medicare Other | Attending: Oncology

## 2020-08-02 VITALS — BP 135/72 | HR 65 | Temp 98.6°F | Resp 16

## 2020-08-02 DIAGNOSIS — C9000 Multiple myeloma not having achieved remission: Secondary | ICD-10-CM

## 2020-08-02 DIAGNOSIS — Z9481 Bone marrow transplant status: Secondary | ICD-10-CM

## 2020-08-02 DIAGNOSIS — Z79899 Other long term (current) drug therapy: Secondary | ICD-10-CM | POA: Diagnosis not present

## 2020-08-02 DIAGNOSIS — Z5112 Encounter for antineoplastic immunotherapy: Secondary | ICD-10-CM | POA: Insufficient documentation

## 2020-08-02 DIAGNOSIS — G620 Drug-induced polyneuropathy: Secondary | ICD-10-CM

## 2020-08-02 DIAGNOSIS — R9089 Other abnormal findings on diagnostic imaging of central nervous system: Secondary | ICD-10-CM

## 2020-08-02 DIAGNOSIS — T451X5A Adverse effect of antineoplastic and immunosuppressive drugs, initial encounter: Secondary | ICD-10-CM

## 2020-08-02 LAB — CMP (CANCER CENTER ONLY)
ALT: 12 U/L (ref 0–44)
AST: 23 U/L (ref 15–41)
Albumin: 3.9 g/dL (ref 3.5–5.0)
Alkaline Phosphatase: 63 U/L (ref 38–126)
Anion gap: 7 (ref 5–15)
BUN: 33 mg/dL — ABNORMAL HIGH (ref 8–23)
CO2: 25 mmol/L (ref 22–32)
Calcium: 9.5 mg/dL (ref 8.9–10.3)
Chloride: 104 mmol/L (ref 98–111)
Creatinine: 1.17 mg/dL (ref 0.61–1.24)
GFR, Estimated: >60 mL/min (ref >60)
Glucose, Bld: 85 mg/dL (ref 70–99)
Potassium: 4.4 mmol/L (ref 3.5–5.1)
Sodium: 136 mmol/L (ref 135–145)
Total Bilirubin: 0.4 mg/dL (ref 0.3–1.2)
Total Protein: 7.2 g/dL (ref 6.5–8.1)

## 2020-08-02 LAB — CBC WITH DIFFERENTIAL (CANCER CENTER ONLY)
Abs Immature Granulocytes: 0.01 10*3/uL (ref 0.00–0.07)
Basophils Absolute: 0 10*3/uL (ref 0.0–0.1)
Basophils Relative: 1 %
Eosinophils Absolute: 0.2 10*3/uL (ref 0.0–0.5)
Eosinophils Relative: 3 %
HCT: 33.8 % — ABNORMAL LOW (ref 39.0–52.0)
Hemoglobin: 11.3 g/dL — ABNORMAL LOW (ref 13.0–17.0)
Immature Granulocytes: 0 %
Lymphocytes Relative: 28 %
Lymphs Abs: 1.7 10*3/uL (ref 0.7–4.0)
MCH: 33.8 pg (ref 26.0–34.0)
MCHC: 33.4 g/dL (ref 30.0–36.0)
MCV: 101.2 fL — ABNORMAL HIGH (ref 80.0–100.0)
Monocytes Absolute: 0.8 10*3/uL (ref 0.1–1.0)
Monocytes Relative: 13 %
Neutro Abs: 3.5 10*3/uL (ref 1.7–7.7)
Neutrophils Relative %: 55 %
Platelet Count: 109 10*3/uL — ABNORMAL LOW (ref 150–400)
RBC: 3.34 MIL/uL — ABNORMAL LOW (ref 4.22–5.81)
RDW: 14.9 % (ref 11.5–15.5)
WBC Count: 6.2 10*3/uL (ref 4.0–10.5)
nRBC: 0 % (ref 0.0–0.2)

## 2020-08-02 MED ORDER — BORTEZOMIB CHEMO SQ INJECTION 3.5 MG (2.5MG/ML)
1.3000 mg/m2 | Freq: Once | INTRAMUSCULAR | Status: AC
  Administered 2020-08-02: 2.5 mg via SUBCUTANEOUS
  Filled 2020-08-02: qty 1

## 2020-08-02 MED ORDER — PROCHLORPERAZINE MALEATE 10 MG PO TABS: 10.0000 mg | ORAL_TABLET | Freq: Once | ORAL | Status: DC

## 2020-08-02 NOTE — Patient Instructions (Signed)
[facility] Discharge Instructions for Patients Receiving Chemotherapy  Today you received the following chemotherapy agents velcade  To help prevent nausea and vomiting after your treatment, we encourage you to take your nausea medication as directed.   If you develop nausea and vomiting that is not controlled by your nausea medication, call the clinic.   BELOW ARE SYMPTOMS THAT SHOULD BE REPORTED IMMEDIATELY:  *FEVER GREATER THAN 100.5 F  *CHILLS WITH OR WITHOUT FEVER  NAUSEA AND VOMITING THAT IS NOT CONTROLLED WITH YOUR NAUSEA MEDICATION  *UNUSUAL SHORTNESS OF BREATH  *UNUSUAL BRUISING OR BLEEDING  TENDERNESS IN MOUTH AND THROAT WITH OR WITHOUT PRESENCE OF ULCERS  *URINARY PROBLEMS  *BOWEL PROBLEMS  UNUSUAL RASH Items with * indicate a potential emergency and should be followed up as soon as possible.  Feel free to call the clinic should you have any questions or concerns. The clinic phone number is [phone].  Please show the CHEMO ALERT CARD at check-in to the Emergency Department and triage nurse.   

## 2020-08-03 LAB — PROTEIN ELECTROPHORESIS, SERUM
A/G Ratio: 1.1 (ref 0.7–1.7)
Albumin ELP: 3.6 g/dL (ref 2.9–4.4)
Alpha-1-Globulin: 0.2 g/dL (ref 0.0–0.4)
Alpha-2-Globulin: 0.8 g/dL (ref 0.4–1.0)
Beta Globulin: 1.1 g/dL (ref 0.7–1.3)
Gamma Globulin: 1.3 g/dL (ref 0.4–1.8)
Globulin, Total: 3.4 g/dL (ref 2.2–3.9)
M-Spike, %: 0.4 g/dL — ABNORMAL HIGH
Total Protein ELP: 7 g/dL (ref 6.0–8.5)

## 2020-08-03 LAB — KAPPA/LAMBDA LIGHT CHAINS
Kappa free light chain: 29.7 mg/L — ABNORMAL HIGH (ref 3.3–19.4)
Kappa, lambda light chain ratio: 1.19 (ref 0.26–1.65)
Lambda free light chains: 25 mg/L (ref 5.7–26.3)

## 2020-08-03 LAB — IGG: IgG (Immunoglobin G), Serum: 1062 mg/dL (ref 603–1613)

## 2020-08-23 ENCOUNTER — Inpatient Hospital Stay: Payer: Medicare Other

## 2020-08-23 ENCOUNTER — Inpatient Hospital Stay: Payer: Medicare Other | Attending: Oncology

## 2020-08-23 ENCOUNTER — Other Ambulatory Visit: Payer: Self-pay

## 2020-08-23 VITALS — BP 125/78 | HR 70 | Temp 98.1°F | Resp 16 | Wt 178.5 lb

## 2020-08-23 DIAGNOSIS — Z5112 Encounter for antineoplastic immunotherapy: Secondary | ICD-10-CM | POA: Insufficient documentation

## 2020-08-23 DIAGNOSIS — G620 Drug-induced polyneuropathy: Secondary | ICD-10-CM

## 2020-08-23 DIAGNOSIS — C9000 Multiple myeloma not having achieved remission: Secondary | ICD-10-CM | POA: Insufficient documentation

## 2020-08-23 DIAGNOSIS — Z9481 Bone marrow transplant status: Secondary | ICD-10-CM

## 2020-08-23 DIAGNOSIS — R9089 Other abnormal findings on diagnostic imaging of central nervous system: Secondary | ICD-10-CM

## 2020-08-23 DIAGNOSIS — T451X5A Adverse effect of antineoplastic and immunosuppressive drugs, initial encounter: Secondary | ICD-10-CM

## 2020-08-23 LAB — CMP (CANCER CENTER ONLY)
ALT: 16 U/L (ref 0–44)
AST: 25 U/L (ref 15–41)
Albumin: 3.8 g/dL (ref 3.5–5.0)
Alkaline Phosphatase: 63 U/L (ref 38–126)
Anion gap: 8 (ref 5–15)
BUN: 33 mg/dL — ABNORMAL HIGH (ref 8–23)
CO2: 24 mmol/L (ref 22–32)
Calcium: 10 mg/dL (ref 8.9–10.3)
Chloride: 103 mmol/L (ref 98–111)
Creatinine: 1.21 mg/dL (ref 0.61–1.24)
GFR, Estimated: >60 mL/min (ref >60)
Glucose, Bld: 83 mg/dL (ref 70–99)
Potassium: 4.2 mmol/L (ref 3.5–5.1)
Sodium: 135 mmol/L (ref 135–145)
Total Bilirubin: 0.3 mg/dL (ref 0.3–1.2)
Total Protein: 7.4 g/dL (ref 6.5–8.1)

## 2020-08-23 LAB — CBC WITH DIFFERENTIAL (CANCER CENTER ONLY)
Abs Immature Granulocytes: 0.01 10*3/uL (ref 0.00–0.07)
Basophils Absolute: 0 10*3/uL (ref 0.0–0.1)
Basophils Relative: 1 %
Eosinophils Absolute: 0.1 10*3/uL (ref 0.0–0.5)
Eosinophils Relative: 2 %
HCT: 34.8 % — ABNORMAL LOW (ref 39.0–52.0)
Hemoglobin: 11.5 g/dL — ABNORMAL LOW (ref 13.0–17.0)
Immature Granulocytes: 0 %
Lymphocytes Relative: 29 %
Lymphs Abs: 1.7 10*3/uL (ref 0.7–4.0)
MCH: 33.5 pg (ref 26.0–34.0)
MCHC: 33 g/dL (ref 30.0–36.0)
MCV: 101.5 fL — ABNORMAL HIGH (ref 80.0–100.0)
Monocytes Absolute: 0.7 10*3/uL (ref 0.1–1.0)
Monocytes Relative: 11 %
Neutro Abs: 3.4 10*3/uL (ref 1.7–7.7)
Neutrophils Relative %: 57 %
Platelet Count: 126 10*3/uL — ABNORMAL LOW (ref 150–400)
RBC: 3.43 MIL/uL — ABNORMAL LOW (ref 4.22–5.81)
RDW: 14.9 % (ref 11.5–15.5)
WBC Count: 6 10*3/uL (ref 4.0–10.5)
nRBC: 0 % (ref 0.0–0.2)

## 2020-08-23 MED ORDER — BORTEZOMIB CHEMO SQ INJECTION 3.5 MG (2.5MG/ML)
1.3000 mg/m2 | Freq: Once | INTRAMUSCULAR | Status: AC
  Administered 2020-08-23: 2.5 mg via SUBCUTANEOUS
  Filled 2020-08-23: qty 1

## 2020-08-23 MED ORDER — PROCHLORPERAZINE MALEATE 10 MG PO TABS: 10.0000 mg | ORAL_TABLET | Freq: Once | ORAL | Status: DC

## 2020-08-23 NOTE — Progress Notes (Signed)
Patient stable at time of discharge. 

## 2020-08-24 LAB — KAPPA/LAMBDA LIGHT CHAINS
Kappa free light chain: 25.4 mg/L — ABNORMAL HIGH (ref 3.3–19.4)
Kappa, lambda light chain ratio: 0.93 (ref 0.26–1.65)
Lambda free light chains: 27.3 mg/L — ABNORMAL HIGH (ref 5.7–26.3)

## 2020-08-24 LAB — IGG: IgG (Immunoglobin G), Serum: 1157 mg/dL (ref 603–1613)

## 2020-08-25 LAB — PROTEIN ELECTROPHORESIS, SERUM
A/G Ratio: 1.2 (ref 0.7–1.7)
Albumin ELP: 3.6 g/dL (ref 2.9–4.4)
Alpha-1-Globulin: 0.2 g/dL (ref 0.0–0.4)
Alpha-2-Globulin: 0.7 g/dL (ref 0.4–1.0)
Beta Globulin: 1 g/dL (ref 0.7–1.3)
Gamma Globulin: 1.2 g/dL (ref 0.4–1.8)
Globulin, Total: 3.1 g/dL (ref 2.2–3.9)
M-Spike, %: 0.4 g/dL — ABNORMAL HIGH
Total Protein ELP: 6.7 g/dL (ref 6.0–8.5)

## 2020-08-31 ENCOUNTER — Telehealth: Payer: Self-pay | Admitting: Internal Medicine

## 2020-08-31 ENCOUNTER — Other Ambulatory Visit: Payer: Self-pay | Admitting: Internal Medicine

## 2020-08-31 NOTE — Telephone Encounter (Signed)
1.Medication Requested: escitalopram (LEXAPRO) 10 MG tablet    2. Pharmacy (Name, [place]): [facility] [facility] [place] [address]   3. On Med List: yes   4. Last Visit with PCP: 7.22.21  5. Next visit date with PCP: 1.25.22  The patient said that he is completley out of his meds.    Agent: Please be advised that RX refills may take up to 3 business days. We ask that you follow-up with your pharmacy.

## 2020-09-01 MED ORDER — ESCITALOPRAM OXALATE 10 MG PO TABS
10.0000 mg | ORAL_TABLET | Freq: Every day | ORAL | 0 refills | Status: DC
Start: 2020-09-01 — End: 2020-10-12

## 2020-09-01 NOTE — Telephone Encounter (Signed)
Rx to the [facility]...Andre [name]

## 2020-09-10 NOTE — Progress Notes (Signed)
[facility] OFFICE PROGRESS NOTE  [name], Evie Lacks, MD [place] [address]  DIAGNOSIS: Multiple myeloma   CURRENT THERAPY: bortezomib every 21 days, zoledronate every 12 weeks  INTERVAL HISTORY: Andre [name] 69 y.o. male returns to the clinic today for a follow up visit. The patient is feeling well today without any concerning complaints. The patient has been tolerating his treatment with velcade without any adverse side effects except for fatigue the week of receiving treatment. He completed physical therapy for his balance complaints. He is active and exercises 6 days a week. Today, he denies any fevers, chills, night sweats, weight loss, or lymphadenopathy. He denies any abnormal bleeding or bruising including epistaxis, gingival bleeding, hemoptysis, melena, hematochezia, petechiae, or hematuria. He denies peripheral neuropathy. He denies pain. The patient is here for evaluation and repeat blood work before receiving his next cycle of zometa and velcade.    HISTORY OF PRESENT ILLNESS:  From the original intake nodes:  Andre [name] was worked up for peptic ulcer disease in August of 2011, with significant bleeding and anemia.  The patient was Helicobacter pylori negative.  He received some epinephrine when he had his EGD and then started on Protonix.  [name] anemia slowly resolved so that by September, his hemoglobin was up to 10.9 and by earlier this month, his hemoglobin was up to 12.5.    As part of his general workup, he was found to have a slightly elevated globulin fraction.  In September, his total protein was 8.3 with an albumin of 3.8.  In January, the total protein was 8.4 and albumin 3.6.  With persistence of this slight abnormality, Dr. Olevia Perches obtained serum immunofixation and SPEP.  The SPTP showed an M-spike of 2.67 grams.  A total IgG was 4,190.  Total IgA low at 28.  Total IgM low at 28 also.  The immunofixation showed a  monoclonal IgG lambda paraprotein.  There were also monoclonal free lambda light chains present. With this information, the patient was referred for further evaluation.   A diagnosis of myeloma was confirmed by bone marrow biopsy and subsequebnt treatment is as detailed below   MEDICAL HISTORY: Past Medical History:  Diagnosis Date  . Allergy   . Anemia   . Arthritis   . Asthma    no treatment x 20 years  . Depression   . Double vision    occurs at times   . Duodenal ulcer   . GERD (gastroesophageal reflux disease)   . Hyperlipidemia   . Hypertension   . Hypothyroidism   . Multiple myeloma 07/04/2011  . Thyroid disease     ALLERGIES:  is allergic to atorvastatin, rosuvastatin, crestor [rosuvastatin calcium], lipitor [atorvastatin calcium], and septra [sulfamethoxazole-trimethoprim].  MEDICATIONS:  Current Outpatient Medications  Medication Sig Dispense Refill  . calcium-vitamin D (OSCAL WITH D) 500-200 MG-UNIT tablet Take 1 tablet by mouth daily.    . cetirizine (ZYRTEC) 10 MG tablet Take 10 mg by mouth daily.    . Cholecalciferol (VITAMIN D3) 2000 units capsule Take 1 capsule (2,000 Units total) by mouth daily. 100 capsule 3  . docusate sodium (COLACE) 100 MG capsule Take 100 mg by mouth daily as needed for mild constipation or moderate constipation.    [name] escitalopram (LEXAPRO) 10 MG tablet Take 1 tablet (10 mg total) by mouth daily. Must keep f/u appt in January for future refills 90 tablet 0  . HYDROCORTISONE ACE, RECTAL, 30 MG SUPP 1  pr bid x 7 d prn 14 suppository 3  . levothyroxine (SYNTHROID) 150 MCG tablet TAKE 1 TABLET BY MOUTH  EVERY MORNING ON AN EMPTY  STOMACH (Patient taking differently: TAKE 1 TABLET BY MOUTH  EVERY MORNING ON AN EMPTY  STOMACH) 90 tablet 3  . ondansetron (ZOFRAN) 8 MG tablet Take 1 tablet (8 mg total) by mouth every 8 (eight) hours as needed for nausea or vomiting. 20 tablet 3  . pantoprazole (PROTONIX) 40 MG tablet TAKE 1 TABLET BY MOUTH  TWICE  A DAY BEFORE MEALS 180 tablet 3  . triamcinolone ointment (KENALOG) 0.5 % Apply 1 application topically 2 (two) times daily. 30 g 3  . valACYclovir (VALTREX) 1000 MG tablet Take 1 tablet (1,000 mg total) by mouth daily. 90 tablet 3  . zolendronic acid (ZOMETA) 4 MG/5ML injection Inject 4 mg into the vein every 3 (three) months.     No current facility-administered medications for this visit.   Facility-Administered Medications Ordered in Other Visits  Medication Dose Route Frequency Provider Last Rate Last Admin  . bortezomib SQ (VELCADE) chemo injection (2.73m/mL concentration) 2.5 mg  1.3 mg/m2 (Treatment Plan Recorded) Subcutaneous Once Magrinat, GVirgie Dad MD      . prochlorperazine (COMPAZINE) tablet 10 mg  10 mg Oral Once Magrinat, GVirgie Dad MD      . prochlorperazine (COMPAZINE) tablet 10 mg  10 mg Oral Once Magrinat, GVirgie Dad MD        SURGICAL HISTORY:  Past Surgical History:  Procedure Laterality Date  . BONE MARROW TRANSPLANT  2011   for MM  . CARDIOLITE STUDY  11/25/2003   NORMAL  . TONSILLECTOMY      REVIEW OF SYSTEMS:   Review of Systems  Constitutional: Positive for fatigue. Negative for appetite change, chills, fever and unexpected weight change.  HENT: Negative for mouth sores, nosebleeds, sore throat and trouble swallowing.   Eyes: Negative for eye problems and icterus.  Respiratory: Negative for cough, hemoptysis, shortness of breath and wheezing.   Cardiovascular: Negative for chest pain and leg swelling.  Gastrointestinal: Negative for abdominal pain, constipation, diarrhea, nausea and vomiting.  Genitourinary: Negative for bladder incontinence, difficulty urinating, dysuria, frequency and hematuria.   Musculoskeletal: Negative for back pain, gait problem, neck pain and neck stiffness.  Skin: Negative for itching and rash.  Neurological: Negative for dizziness, extremity weakness, gait problem, headaches, light-headedness and seizures.  Hematological: Negative  for adenopathy. Does not bruise/bleed easily.  Psychiatric/Behavioral: Negative for confusion, depression and sleep disturbance. The patient is not nervous/anxious.     PHYSICAL EXAMINATION:  Blood pressure 129/74, pulse 80, temperature 98 F (36.7 C), temperature source Tympanic, resp. rate 18, height 5' 9.5" (1.765 m), weight 180 lb 12.8 oz (82 kg), SpO2 98 %.  ECOG PERFORMANCE STATUS: 1 - Symptomatic but completely ambulatory  Physical Exam  Constitutional: Oriented to person, place, and time and well-developed, well-nourished, and in no distress.  HENT:  Head: Normocephalic and atraumatic.  Mouth/Throat: Oropharynx is clear and moist. No oropharyngeal exudate.  Eyes: Conjunctivae are normal. Right eye exhibits no discharge. Left eye exhibits no discharge. No scleral icterus.  Neck: Normal range of motion. Neck supple.  Cardiovascular: Normal rate, regular rhythm, normal heart sounds and intact distal pulses.   Pulmonary/Chest: Effort normal and breath sounds normal. No respiratory distress. No wheezes. No rales.  Abdominal: Soft. Bowel sounds are normal. Exhibits no distension and no mass. There is no tenderness.  Musculoskeletal: Normal range of motion. Exhibits no edema.  Lymphadenopathy:    No cervical adenopathy.  Neurological: Alert and oriented to person, place, and time. Exhibits normal muscle tone. Gait normal. Coordination normal.  Skin: Skin is warm and dry. No rash noted. Not diaphoretic. No erythema. No pallor.  Psychiatric: Mood, memory and judgment normal.  Vitals reviewed.  LABORATORY DATA: Lab Results  Component Value Date   WBC 5.7 09/13/2020   HGB 11.4 (L) 09/13/2020   HCT 33.2 (L) 09/13/2020   MCV 100.3 (H) 09/13/2020   PLT 110 (L) 09/13/2020      Chemistry      Component Value Date/Time   NA 134 (L) 09/13/2020 1344   NA 136 09/14/2017 1008   K 4.3 09/13/2020 1344   K 4.3 09/14/2017 1008   CL 103 09/13/2020 1344   CL 107 03/10/2013 1352   CO2 24  09/13/2020 1344   CO2 23 09/14/2017 1008   BUN 32 (H) 09/13/2020 1344   BUN 19.4 09/14/2017 1008   CREATININE 1.11 09/13/2020 1344   CREATININE 1.1 09/14/2017 1008      Component Value Date/Time   CALCIUM 9.3 09/13/2020 1344   CALCIUM 10.1 09/14/2017 1008   ALKPHOS 60 09/13/2020 1344   ALKPHOS 80 09/14/2017 1008   AST 26 09/13/2020 1344   AST 26 09/14/2017 1008   ALT 16 09/13/2020 1344   ALT 23 09/14/2017 1008   BILITOT 0.3 09/13/2020 1344   BILITOT 0.42 09/14/2017 1008       RADIOGRAPHIC STUDIES:  No results found.   ASSESSMENT/PLAN:  69 y.o.  man with a history of multiple myeloma diagnosed February of 2012, with an initial M-spike of 2.6 g/dL, IFE showing an IgG lambda paraprotein and free lambda light chains in the urine. Cytogenetics showed trisomy 48. Bone marrow biopsy showed a 22% plasmacytosis. Treated with   (1) bortezomib (subcutaneously), lenalidomide, and dexamethasone, with repeat bone marrow biopsy May of 2012 showing 10% plasmacytosis   (2) high-dose chemotherapy with BCNU and melphalan at [facility], followed by stem cell rescue July of 2012   (3) on zoledronic acid started December of 2012, initially monthly, currently given every 3 months, most recent dose  12/07/2015  (4) low-dose lenalidomide resumed April 2013, interrupted several times.  Resumed again on 02/19/2013, ata dose of 5 mg daily, 21 days on and 7 days off, later further reduced to 7 days on, 7 days off  (5) CNS symptoms and abnormal brain MRI extensively evaluated by neurology with no definitive diagnosis established  (6) rising M spike noted June 2014 but did not meet criteria for carfilzomib study  (7) on lenalidomide 25 mg daily, 14 days on, 7 days off, starting 04/18/2013, interrupted December 2014 because of rash;              (a) resumed January 2015 at 10 mg/ day at 21 days on/ 7 days off             (Cristofer) starting 08/18/2014 decreased to 10 mg/ day 14 days on, 7 days off  because of cytopenias             (c) as of February 2016 was on 5 mg daily 7 days on 7 days off             (d) lenalidomide discontinued December 2016 with evidence of disease progression  (8) transient global amnesia 05/29/2015, resolved without intervention  (9) starting PVD 10/25/2015 w ASA 325 thromboprophylaxis, valacyclovir prophylaxis, last dose 12/17/2015             (  a) pomalidomide 4 mg/d days 1-14             (Demitrious) bortezomib sQ days 2,5,9,12 of each 21 day cycle             (c) dexamethasome 20 mg two days a week             (d) dexamethasone bortezomib and pomalidomide discontinued late December 2018 with poor tolerance  (10) metapneumovirus pneumonia April 2017             (a) completing course of steroids and week of bactrim mid April 2017  (11) status post second autologous transplant at [facility] 02/04/2016(preparatory regimen melphalan 200 mg/m)             (a) received twelve-month vaccinations 03/14/2017 (DPT, Haemophilus, Pneumovax 13, polio)             (Rigel) 14 months injections 05/04/2016 include DTaP, Hib conjugate, HepB energex Leviticus 20 mcg/ml, Prevnar 13             (c) 24 month vaccines due at [facility] June 2019  (12) maintenance therapy started November 2017, consisting of             (a) bortezomib 1.3 mg/M2 every 14 days, first dose 07/27/2016             (Juanluis) pomalidomide 1 mg days 1-21 Q28 days, started 07/19/2016             (c) zolendronate monthly started 07/27/2016 (previously Q 3 months) however patient unable to tolerate, and changed back to q3 months in April, 2018              (d) Bortezomib changed to monthly as of June 2018 because of tolerance issues, however discontinued after 09/14/2017 dose because of a rise in his M spike             (e) pomalidomide held after 10/18/2017 in preparation for possible study at [facility] (venetoclax)--never resumed             (f) with numbers actually improved off treatment, resumed every 2-week bortezomib 12/25/2017              (g) changed to every 3-week bortezomib as of 03/17/2019   PLAN:  [name] is a pleasant 69 year old male diagnosed with multiple myeloma almost 10 years ago.  Dr. [name] is continuing with every 3-week bortezomib. Labs were reviewed. Recommend he proceed with velcade today as scheduled.   Dr. Alvie Heidelberg at [facility] as suggested since the velcade interval has been broadened that we monitor more closely so we are going to be doing SPEP's and kappa lambda chains every 21days.  That way we can tighten treatment if there is any evidence of disease progression. His SPEP and kappa/lamda light chains are still pending from today. If any concern for disease progression, Dr. [name] will arrange for the patient to be seen sooner. For now, we will continue to see the patient with his next zometa dose (in 12 weeks from today). He will receive zometa and velcade today as well. He will continue to receive velcade every 3 weeks.   The patient was advised to call immediately if he has any concerning symptoms in the interval. The patient voices understanding of current disease status and treatment options and is in agreement with the current care plan. All questions were answered. The patient knows to call the clinic with any problems, questions or concerns. We can certainly see the  patient much sooner if necessary   No orders of the defined types were placed in this encounter.    Mckenzi Buonomo L Brandi Tomlinson, PA-C 09/13/20

## 2020-09-13 ENCOUNTER — Encounter: Payer: Self-pay | Admitting: Physician Assistant

## 2020-09-13 ENCOUNTER — Other Ambulatory Visit: Payer: Self-pay

## 2020-09-13 ENCOUNTER — Inpatient Hospital Stay: Payer: Medicare Other

## 2020-09-13 ENCOUNTER — Inpatient Hospital Stay (HOSPITAL_BASED_OUTPATIENT_CLINIC_OR_DEPARTMENT_OTHER): Payer: Medicare Other | Admitting: Physician Assistant

## 2020-09-13 VITALS — BP 129/74 | HR 80 | Temp 98.0°F | Resp 18 | Ht 69.5 in | Wt 180.8 lb

## 2020-09-13 DIAGNOSIS — Z9481 Bone marrow transplant status: Secondary | ICD-10-CM

## 2020-09-13 DIAGNOSIS — C9000 Multiple myeloma not having achieved remission: Secondary | ICD-10-CM

## 2020-09-13 DIAGNOSIS — R9089 Other abnormal findings on diagnostic imaging of central nervous system: Secondary | ICD-10-CM

## 2020-09-13 DIAGNOSIS — G620 Drug-induced polyneuropathy: Secondary | ICD-10-CM

## 2020-09-13 DIAGNOSIS — T451X5A Adverse effect of antineoplastic and immunosuppressive drugs, initial encounter: Secondary | ICD-10-CM

## 2020-09-13 DIAGNOSIS — Z5112 Encounter for antineoplastic immunotherapy: Secondary | ICD-10-CM | POA: Diagnosis not present

## 2020-09-13 LAB — CBC WITH DIFFERENTIAL (CANCER CENTER ONLY)
Abs Immature Granulocytes: 0.01 10*3/uL (ref 0.00–0.07)
Basophils Absolute: 0 10*3/uL (ref 0.0–0.1)
Basophils Relative: 0 %
Eosinophils Absolute: 0.2 10*3/uL (ref 0.0–0.5)
Eosinophils Relative: 3 %
HCT: 33.2 % — ABNORMAL LOW (ref 39.0–52.0)
Hemoglobin: 11.4 g/dL — ABNORMAL LOW (ref 13.0–17.0)
Immature Granulocytes: 0 %
Lymphocytes Relative: 25 %
Lymphs Abs: 1.4 10*3/uL (ref 0.7–4.0)
MCH: 34.4 pg — ABNORMAL HIGH (ref 26.0–34.0)
MCHC: 34.3 g/dL (ref 30.0–36.0)
MCV: 100.3 fL — ABNORMAL HIGH (ref 80.0–100.0)
Monocytes Absolute: 0.7 10*3/uL (ref 0.1–1.0)
Monocytes Relative: 11 %
Neutro Abs: 3.4 10*3/uL (ref 1.7–7.7)
Neutrophils Relative %: 61 %
Platelet Count: 110 10*3/uL — ABNORMAL LOW (ref 150–400)
RBC: 3.31 MIL/uL — ABNORMAL LOW (ref 4.22–5.81)
RDW: 14.6 % (ref 11.5–15.5)
WBC Count: 5.7 10*3/uL (ref 4.0–10.5)
nRBC: 0 % (ref 0.0–0.2)

## 2020-09-13 LAB — CMP (CANCER CENTER ONLY)
ALT: 16 U/L (ref 0–44)
AST: 26 U/L (ref 15–41)
Albumin: 3.7 g/dL (ref 3.5–5.0)
Alkaline Phosphatase: 60 U/L (ref 38–126)
Anion gap: 7 (ref 5–15)
BUN: 32 mg/dL — ABNORMAL HIGH (ref 8–23)
CO2: 24 mmol/L (ref 22–32)
Calcium: 9.3 mg/dL (ref 8.9–10.3)
Chloride: 103 mmol/L (ref 98–111)
Creatinine: 1.11 mg/dL (ref 0.61–1.24)
GFR, Estimated: >60 mL/min (ref >60)
Glucose, Bld: 99 mg/dL (ref 70–99)
Potassium: 4.3 mmol/L (ref 3.5–5.1)
Sodium: 134 mmol/L — ABNORMAL LOW (ref 135–145)
Total Bilirubin: 0.3 mg/dL (ref 0.3–1.2)
Total Protein: 7.4 g/dL (ref 6.5–8.1)

## 2020-09-13 MED ORDER — ZOLEDRONIC ACID 4 MG/100ML IV SOLN
4.0000 mg | Freq: Once | INTRAVENOUS | Status: AC
  Administered 2020-09-13: 4 mg via INTRAVENOUS

## 2020-09-13 MED ORDER — SODIUM CHLORIDE 0.9 % IV SOLN
INTRAVENOUS | Status: DC
  Filled 2020-09-13: qty 250

## 2020-09-13 MED ORDER — ZOLEDRONIC ACID 4 MG/100ML IV SOLN
INTRAVENOUS | Status: AC
  Filled 2020-09-13: qty 100

## 2020-09-13 MED ORDER — PROCHLORPERAZINE MALEATE 10 MG PO TABS: 10.0000 mg | ORAL_TABLET | Freq: Once | ORAL | Status: DC

## 2020-09-13 MED ORDER — BORTEZOMIB CHEMO SQ INJECTION 3.5 MG (2.5MG/ML)
1.3000 mg/m2 | Freq: Once | INTRAMUSCULAR | Status: AC
  Administered 2020-09-13: 2.5 mg via SUBCUTANEOUS
  Filled 2020-09-13: qty 1

## 2020-09-13 NOTE — Patient Instructions (Signed)
[facility] Discharge Instructions for Patients Receiving Chemotherapy  Today you received the following chemotherapy agents velcade  To help prevent nausea and vomiting after your treatment, we encourage you to take your nausea medication as directed.    If you develop nausea and vomiting that is not controlled by your nausea medication, call the clinic.   BELOW ARE SYMPTOMS THAT SHOULD BE REPORTED IMMEDIATELY:  *FEVER GREATER THAN 100.5 F  *CHILLS WITH OR WITHOUT FEVER  NAUSEA AND VOMITING THAT IS NOT CONTROLLED WITH YOUR NAUSEA MEDICATION  *UNUSUAL SHORTNESS OF BREATH  *UNUSUAL BRUISING OR BLEEDING  TENDERNESS IN MOUTH AND THROAT WITH OR WITHOUT PRESENCE OF ULCERS  *URINARY PROBLEMS  *BOWEL PROBLEMS  UNUSUAL RASH Items with * indicate a potential emergency and should be followed up as soon as possible.  Feel free to call the clinic should you have any questions or concerns. The clinic phone number is [phone].  Please show the [facility] at check-in to the Emergency Department and triage nurse.    Zoledronic Acid injection (Hypercalcemia, Oncology) What is this medicine? ZOLEDRONIC ACID (ZOE le dron ik AS id) lowers the amount of calcium loss from bone. It is used to treat too much calcium in your blood from cancer. It is also used to prevent complications of cancer that has spread to the bone. This medicine may be used for other purposes; ask your health care provider or pharmacist if you have questions. COMMON BRAND NAME(S): Zometa What should I tell my health care provider before I take this medicine? They need to know if you have any of these conditions:  aspirin-sensitive asthma  cancer, especially if you are receiving medicines used to treat cancer  dental disease or wear dentures  infection  kidney disease  receiving corticosteroids like dexamethasone or prednisone  an unusual or allergic reaction to zoledronic acid, other  medicines, foods, dyes, or preservatives  pregnant or trying to get pregnant  breast-feeding How should I use this medicine? This medicine is for infusion into a vein. It is given by a health care professional in a hospital or clinic setting. Talk to your pediatrician regarding the use of this medicine in children. Special care may be needed. Overdosage: If you think you have taken too much of this medicine contact a poison control center or emergency room at once. NOTE: This medicine is only for you. Do not share this medicine with others. What if I miss a dose? It is important not to miss your dose. Call your doctor or health care professional if you are unable to keep an appointment. What may interact with this medicine?  certain antibiotics given by injection  NSAIDs, medicines for pain and inflammation, like ibuprofen or naproxen  some diuretics like bumetanide, furosemide  teriparatide  thalidomide This list may not describe all possible interactions. Give your health care provider a list of all the medicines, herbs, non-prescription drugs, or dietary supplements you use. Also tell them if you smoke, drink alcohol, or use illegal drugs. Some items may interact with your medicine. What should I watch for while using this medicine? Visit your doctor or health care professional for regular checkups. It may be some time before you see the benefit from this medicine. Do not stop taking your medicine unless your doctor tells you to. Your doctor may order blood tests or other tests to see how you are doing. Women should inform their doctor if they wish to become pregnant or think they  might be pregnant. There is a potential for serious side effects to an unborn child. Talk to your health care professional or pharmacist for more information. You should make sure that you get enough calcium and vitamin D while you are taking this medicine. Discuss the foods you eat and the vitamins you take  with your health care professional. Some people who take this medicine have severe bone, joint, and/or muscle pain. This medicine may also increase your risk for jaw problems or a broken thigh bone. Tell your doctor right away if you have severe pain in your jaw, bones, joints, or muscles. Tell your doctor if you have any pain that does not go away or that gets worse. Tell your dentist and dental surgeon that you are taking this medicine. You should not have major dental surgery while on this medicine. See your dentist to have a dental exam and fix any dental problems before starting this medicine. Take good care of your teeth while on this medicine. Make sure you see your dentist for regular follow-up appointments. What side effects may I notice from receiving this medicine? Side effects that you should report to your doctor or health care professional as soon as possible:  allergic reactions like skin rash, itching or hives, swelling of the face, lips, or tongue  anxiety, confusion, or depression  breathing problems  changes in vision  eye pain  feeling faint or lightheaded, falls  jaw pain, especially after dental work  mouth sores  muscle cramps, stiffness, or weakness  redness, blistering, peeling or loosening of the skin, including inside the mouth  trouble passing urine or change in the amount of urine Side effects that usually do not require medical attention (report to your doctor or health care professional if they continue or are bothersome):  bone, joint, or muscle pain  constipation  diarrhea  fever  hair loss  irritation at site where injected  loss of appetite  nausea, vomiting  stomach upset  trouble sleeping  trouble swallowing  weak or tired This list may not describe all possible side effects. Call your doctor for medical advice about side effects. You may report side effects to FDA at 1-800-FDA-1088. Where should I keep my medicine? This drug  is given in a hospital or clinic and will not be stored at home. NOTE: This sheet is a summary. It may not cover all possible information. If you have questions about this medicine, talk to your doctor, pharmacist, or health care provider.  2020 Elsevier/Gold Standard (2014-01-31 14:19:39)

## 2020-09-14 LAB — PROTEIN ELECTROPHORESIS, SERUM
A/G Ratio: 1.2 (ref 0.7–1.7)
Albumin ELP: 3.6 g/dL (ref 2.9–4.4)
Alpha-1-Globulin: 0.2 g/dL (ref 0.0–0.4)
Alpha-2-Globulin: 0.7 g/dL (ref 0.4–1.0)
Beta Globulin: 0.9 g/dL (ref 0.7–1.3)
Gamma Globulin: 1.1 g/dL (ref 0.4–1.8)
Globulin, Total: 2.9 g/dL (ref 2.2–3.9)
M-Spike, %: 0.4 g/dL — ABNORMAL HIGH
Total Protein ELP: 6.5 g/dL (ref 6.0–8.5)

## 2020-09-14 LAB — KAPPA/LAMBDA LIGHT CHAINS
Kappa free light chain: 23.6 mg/L — ABNORMAL HIGH (ref 3.3–19.4)
Kappa, lambda light chain ratio: 0.8 (ref 0.26–1.65)
Lambda free light chains: 29.5 mg/L — ABNORMAL HIGH (ref 5.7–26.3)

## 2020-09-14 LAB — IGG: IgG (Immunoglobin G), Serum: 1128 mg/dL (ref 603–1613)

## 2020-09-28 ENCOUNTER — Other Ambulatory Visit: Payer: Self-pay | Admitting: Oncology

## 2020-10-04 ENCOUNTER — Ambulatory Visit: Payer: Medicare Other

## 2020-10-04 ENCOUNTER — Telehealth: Payer: Self-pay | Admitting: *Deleted

## 2020-10-04 ENCOUNTER — Inpatient Hospital Stay: Payer: Medicare Other | Attending: Oncology

## 2020-10-04 ENCOUNTER — Other Ambulatory Visit: Payer: Self-pay | Admitting: Adult Health

## 2020-10-04 DIAGNOSIS — C9000 Multiple myeloma not having achieved remission: Secondary | ICD-10-CM

## 2020-10-04 NOTE — Telephone Encounter (Signed)
PC to patient asking if he will be keeping his appointments today despite the weather.  Patient states he can't get out of his driveway so he will not be here.  Instructed pt to call & reschedule appointment.  Verbalizes understanding.

## 2020-10-04 NOTE — Progress Notes (Signed)
Referral placed for evusheld per Dr. Magrinat 

## 2020-10-11 ENCOUNTER — Other Ambulatory Visit: Payer: Self-pay

## 2020-10-12 ENCOUNTER — Encounter: Payer: Self-pay | Admitting: Internal Medicine

## 2020-10-12 ENCOUNTER — Ambulatory Visit (INDEPENDENT_AMBULATORY_CARE_PROVIDER_SITE_OTHER): Payer: Medicare Other | Admitting: Internal Medicine

## 2020-10-12 DIAGNOSIS — K5900 Constipation, unspecified: Secondary | ICD-10-CM

## 2020-10-12 DIAGNOSIS — E039 Hypothyroidism, unspecified: Secondary | ICD-10-CM | POA: Diagnosis not present

## 2020-10-12 DIAGNOSIS — D559 Anemia due to enzyme disorder, unspecified: Secondary | ICD-10-CM

## 2020-10-12 DIAGNOSIS — K219 Gastro-esophageal reflux disease without esophagitis: Secondary | ICD-10-CM

## 2020-10-12 DIAGNOSIS — C9000 Multiple myeloma not having achieved remission: Secondary | ICD-10-CM | POA: Diagnosis not present

## 2020-10-12 MED ORDER — TRIAMCINOLONE ACETONIDE 0.1 % EX OINT: 1.0000 "application " | TOPICAL_OINTMENT | Freq: Two times a day (BID) | CUTANEOUS | 2 refills | Status: DC

## 2020-10-12 MED ORDER — ESCITALOPRAM OXALATE 10 MG PO TABS
10.0000 mg | ORAL_TABLET | Freq: Every day | ORAL | 3 refills | Status: DC
  Filled 2021-05-16 – 2021-06-06 (×2): qty 90, 90d supply, fill #0
  Filled 2021-08-23: qty 90, 90d supply, fill #1

## 2020-10-12 MED ORDER — VALACYCLOVIR HCL 1 G PO TABS: 1000.0000 mg | ORAL_TABLET | Freq: Every day | ORAL | 3 refills | Status: DC

## 2020-10-12 NOTE — Assessment & Plan Note (Signed)
Protonix °Pepcid °

## 2020-10-12 NOTE — Progress Notes (Signed)
Subjective:  Patient ID: Andre [name], male    DOB: 01/19/1951  Age: 70 y.o. MRN: [id number]  CC: Follow-up (6 month f/u)   HPI Jayleon Jerome Viglione presents for MM, anemia, GERD f/u  Outpatient Medications Prior to Visit  Medication Sig Dispense Refill  . calcium-vitamin D (OSCAL WITH D) 500-200 MG-UNIT tablet Take 1 tablet by mouth daily.    . cetirizine (ZYRTEC) 10 MG tablet Take 10 mg by mouth daily.    . Cholecalciferol (VITAMIN D3) 2000 units capsule Take 1 capsule (2,000 Units total) by mouth daily. 100 capsule 3  . docusate sodium (COLACE) 100 MG capsule Take 100 mg by mouth daily as needed for mild constipation or moderate constipation.    [name] escitalopram (LEXAPRO) 10 MG tablet Take 1 tablet (10 mg total) by mouth daily. Must keep f/u appt in January for future refills 90 tablet 0  . HYDROCORTISONE ACE, RECTAL, 30 MG SUPP 1 pr bid x 7 d prn 14 suppository 3  . levothyroxine (SYNTHROID) 150 MCG tablet TAKE 1 TABLET BY MOUTH  EVERY MORNING ON AN EMPTY  STOMACH (Patient taking differently: TAKE 1 TABLET BY MOUTH  EVERY MORNING ON AN EMPTY  STOMACH) 90 tablet 3  . ondansetron (ZOFRAN) 8 MG tablet Take 1 tablet (8 mg total) by mouth every 8 (eight) hours as needed for nausea or vomiting. 20 tablet 3  . pantoprazole (PROTONIX) 40 MG tablet TAKE 1 TABLET BY MOUTH  TWICE A DAY BEFORE MEALS 180 tablet 3  . triamcinolone ointment (KENALOG) 0.5 % Apply 1 application topically 2 (two) times daily. 30 g 3  . valACYclovir (VALTREX) 1000 MG tablet Take 1 tablet (1,000 mg total) by mouth daily. 90 tablet 3  . zolendronic acid (ZOMETA) 4 MG/5ML injection Inject 4 mg into the vein every 3 (three) months.     Facility-Administered Medications Prior to Visit  Medication Dose Route Frequency Provider Last Rate Last Admin  . prochlorperazine (COMPAZINE) tablet 10 mg  10 mg Oral Once Magrinat, [name], MD        ROS: Review of Systems  Constitutional: Positive for fatigue. Negative for appetite  change and unexpected weight change.  HENT: Negative for congestion, nosebleeds, sneezing, sore throat and trouble swallowing.   Eyes: Negative for itching and visual disturbance.  Respiratory: Negative for cough.   Cardiovascular: Negative for chest pain, palpitations and leg swelling.  Gastrointestinal: Negative for abdominal distention, blood in stool, diarrhea and nausea.  Genitourinary: Negative for frequency and hematuria.  Musculoskeletal: Negative for back pain, gait problem, joint swelling and neck pain.  Skin: Negative for rash.  Neurological: Negative for dizziness, tremors, speech difficulty and weakness.  Psychiatric/Behavioral: Negative for agitation, dysphoric mood and sleep disturbance. The patient is not nervous/anxious.     Objective:  BP 120/70 (BP Location: Left Arm)   Pulse 67   Temp 98 F (36.7 C) (Oral)   Ht 5' 9.5" (1.765 m)   Wt 181 lb 12.8 oz (82.5 kg)   SpO2 98%   BMI 26.46 kg/m   BP Readings from Last 3 Encounters:  10/12/20 120/70  09/13/20 129/74  08/23/20 125/78    Wt Readings from Last 3 Encounters:  10/12/20 181 lb 12.8 oz (82.5 kg)  09/13/20 180 lb 12.8 oz (82 kg)  08/23/20 178 lb 8 oz (81 kg)    Physical Exam Constitutional:      General: He is not in acute distress.    Appearance: He is well-developed and well-nourished.  He is not diaphoretic.     Comments: NAD  HENT:     Head: Normocephalic and atraumatic.     Right Ear: External ear normal.     Left Ear: External ear normal.     Nose: Nose normal.     Mouth/Throat:     Mouth: Oropharynx is clear and moist.     Pharynx: No oropharyngeal exudate.  Eyes:     General: No scleral icterus.       Right eye: No discharge.        Left eye: No discharge.     Extraocular Movements: EOM normal.     Conjunctiva/sclera: Conjunctivae normal.     Pupils: Pupils are equal, round, and reactive to light.  Neck:     Thyroid: No thyromegaly.     Vascular: No JVD.     Trachea: No tracheal  deviation.  Cardiovascular:     Rate and Rhythm: Normal rate and regular rhythm.     Pulses: Intact distal pulses.     Heart sounds: Normal heart sounds. No murmur heard. No friction rub. No gallop.   Pulmonary:     Effort: Pulmonary effort is normal. No respiratory distress.     Breath sounds: Normal breath sounds. No stridor. No wheezing or rales.  Chest:     Chest wall: No tenderness.  Abdominal:     General: Bowel sounds are normal. There is no distension.     Palpations: Abdomen is soft. There is no mass.     Tenderness: There is no abdominal tenderness. There is no guarding or rebound.  Genitourinary:    Penis: Normal. No tenderness.      Prostate: Normal.     Rectum: Normal. Guaiac result negative.  Musculoskeletal:        General: No tenderness or edema. Normal range of motion.     Cervical back: Normal range of motion and neck supple.  Lymphadenopathy:     Cervical: No cervical adenopathy.  Skin:    General: Skin is warm and dry.     Coloration: Skin is not pale.     Findings: No erythema or rash.  Neurological:     Mental Status: He is alert and oriented to person, place, and time.     Cranial Nerves: No cranial nerve deficit.     Motor: No abnormal muscle tone.     Coordination: He displays a negative Romberg sign. Coordination normal.     Gait: Gait normal.     Deep Tendon Reflexes: Reflexes are normal and symmetric. Reflexes normal.  Psychiatric:        Mood and Affect: Mood and affect normal.        Behavior: Behavior normal.        Thought Content: Thought content normal.        Judgment: Judgment normal.   anal area WNL SKs face  Lab Results  Component Value Date   WBC 5.7 09/13/2020   HGB 11.4 (L) 09/13/2020   HCT 33.2 (L) 09/13/2020   PLT 110 (L) 09/13/2020   GLUCOSE 99 09/13/2020   CHOL 164 09/06/2017   TRIG 194.0 (H) 09/06/2017   HDL 38.60 (L) 09/06/2017   LDLDIRECT 155.8 11/13/2012   LDLCALC 86 09/06/2017   ALT 16 09/13/2020   AST 26  09/13/2020   NA 134 (L) 09/13/2020   K 4.3 09/13/2020   CL 103 09/13/2020   CREATININE 1.11 09/13/2020   BUN 32 (H) 09/13/2020   CO2 24 09/13/2020  TSH 0.05 (L) 09/06/2017   PSA 1.76 09/06/2017   INR 1.07 02/27/2016   HGBA1C 5.4 03/27/2012    DG Ribs Unilateral W/Chest Right  Result Date: 06/15/2020 CLINICAL DATA:  Right rib pain after a fall. EXAM: RIGHT RIBS AND CHEST - 3+ VIEW COMPARISON:  Chest radiographs 05/23/2018.  Bone survey 04/05/2020. FINDINGS: The cardiomediastinal silhouette is within normal limits. Lung volumes are slightly lower than on the prior chest radiographs. No confluent airspace opacity, edema, pleural effusion, pneumothorax is identified. There are old right fourth through eighth rib fractures. No definite acute fracture is identified. IMPRESSION: Multiple old right rib fractures without a definite acute fracture identified. Electronically Signed   By: Logan Bores M.D.   On: 06/15/2020 12:18   CT Head Wo Contrast  Result Date: 06/15/2020 CLINICAL DATA:  Head trauma, moderate/severe. Neck trauma. Additional history provided: Fall, laceration to right eyebrow. EXAM: CT HEAD WITHOUT CONTRAST CT CERVICAL SPINE WITHOUT CONTRAST TECHNIQUE: Multidetector CT imaging of the head and cervical spine was performed following the standard protocol without intravenous contrast. Multiplanar CT image reconstructions of the cervical spine were also generated. COMPARISON:  Head CT 05/23/2018. cervical spine MRI 10/01/2012. FINDINGS: CT HEAD FINDINGS Brain: Stable, mild generalized cerebral atrophy. Redemonstrated small remote infarct within the left cerebellum. There is no acute intracranial hemorrhage. No demarcated cortical infarct. No extra-axial fluid collection. No evidence of intracranial mass. No midline shift. Vascular: No hyperdense vessel.  Atherosclerotic calcifications. Skull: No calvarial fracture. As before, there are innumerable lytic foci within the calvarium consistent  with the known history of multiple myeloma. Sinuses/Orbits: Visualized orbits show no acute finding. Mild ethmoid sinus mucosal thickening. No significant mastoid effusion. CT CERVICAL SPINE FINDINGS Alignment: Mild cervical dextrocurvature. Straightening of the expected cervical lordosis. No significant spondylolisthesis. Skull base and vertebrae: The basion-dental and atlanto-dental intervals are maintained.No evidence of acute fracture to the cervical spine. There are a few small scattered lytic foci within the cervical spine which may reflect myeloma lesions (for instance a subcentimeter lesion within the C2 spinous process (series 11, image 32). Soft tissues and spinal canal: No prevertebral fluid or swelling. No visible canal hematoma. Disc levels: Cervical spondylosis with multilevel disc space narrowing, disc bulges, uncovertebral and facet hypertrophy. C5-C6 posterior disc osteophyte complex. No high-grade bony spinal canal stenosis. Multilevel neural foraminal narrowing greatest on the left at C5-C6. Of note, disc space narrowing is advanced at C5-C6 and C6-C7. Upper chest: No consolidation within the imaged lung apices. No visible pneumothorax. IMPRESSION: CT head: 1. No evidence of acute intracranial abnormality. 2. Stable, mild generalized cerebral atrophy. 3. Redemonstrated small remote infarct within the left cerebellar hemisphere. 4. As before, there are innumerable small lytic foci within the calvarium consistent with the known history of multiple myeloma. 5. Mild ethmoid sinus mucosal thickening. CT cervical spine: 1. No evidence of acute fracture to the cervical spine. 2. Cervical spondylosis as described and greatest at C5-C6 and C6-C7. 3. There are a few small scattered lytic foci within the cervical spine which may reflect myeloma lesions. Electronically Signed   By: Kellie Simmering DO   On: 06/15/2020 14:16   CT Cervical Spine Wo Contrast  Result Date: 06/15/2020 CLINICAL DATA:  Head trauma,  moderate/severe. Neck trauma. Additional history provided: Fall, laceration to right eyebrow. EXAM: CT HEAD WITHOUT CONTRAST CT CERVICAL SPINE WITHOUT CONTRAST TECHNIQUE: Multidetector CT imaging of the head and cervical spine was performed following the standard protocol without intravenous contrast. Multiplanar CT image reconstructions of the cervical  spine were also generated. COMPARISON:  Head CT 05/23/2018. cervical spine MRI 10/01/2012. FINDINGS: CT HEAD FINDINGS Brain: Stable, mild generalized cerebral atrophy. Redemonstrated small remote infarct within the left cerebellum. There is no acute intracranial hemorrhage. No demarcated cortical infarct. No extra-axial fluid collection. No evidence of intracranial mass. No midline shift. Vascular: No hyperdense vessel.  Atherosclerotic calcifications. Skull: No calvarial fracture. As before, there are innumerable lytic foci within the calvarium consistent with the known history of multiple myeloma. Sinuses/Orbits: Visualized orbits show no acute finding. Mild ethmoid sinus mucosal thickening. No significant mastoid effusion. CT CERVICAL SPINE FINDINGS Alignment: Mild cervical dextrocurvature. Straightening of the expected cervical lordosis. No significant spondylolisthesis. Skull base and vertebrae: The basion-dental and atlanto-dental intervals are maintained.No evidence of acute fracture to the cervical spine. There are a few small scattered lytic foci within the cervical spine which may reflect myeloma lesions (for instance a subcentimeter lesion within the C2 spinous process (series 11, image 32). Soft tissues and spinal canal: No prevertebral fluid or swelling. No visible canal hematoma. Disc levels: Cervical spondylosis with multilevel disc space narrowing, disc bulges, uncovertebral and facet hypertrophy. C5-C6 posterior disc osteophyte complex. No high-grade bony spinal canal stenosis. Multilevel neural foraminal narrowing greatest on the left at C5-C6. Of  note, disc space narrowing is advanced at C5-C6 and C6-C7. Upper chest: No consolidation within the imaged lung apices. No visible pneumothorax. IMPRESSION: CT head: 1. No evidence of acute intracranial abnormality. 2. Stable, mild generalized cerebral atrophy. 3. Redemonstrated small remote infarct within the left cerebellar hemisphere. 4. As before, there are innumerable small lytic foci within the calvarium consistent with the known history of multiple myeloma. 5. Mild ethmoid sinus mucosal thickening. CT cervical spine: 1. No evidence of acute fracture to the cervical spine. 2. Cervical spondylosis as described and greatest at C5-C6 and C6-C7. 3. There are a few small scattered lytic foci within the cervical spine which may reflect myeloma lesions. Electronically Signed   By: Kellie Simmering DO   On: 06/15/2020 14:16    Assessment & Plan:    [name], MD

## 2020-10-12 NOTE — Assessment & Plan Note (Signed)
CBC periodically 

## 2020-10-12 NOTE — Patient Instructions (Signed)
For a mild COVID-19 case you can take zinc 50 mg a day for 1 week, vitamin C 1000 mg daily for 1 week, vitamin D2 50,000 units weekly for 2 months (unless you are taking vitamin D daily already), Quercetin 500 mg twice a day for 1 week (if you can get it quick enough).  Maintain good oral hydration and take Tylenol with high fever.  Thanks  You can buy COVID-19 express tests for home use. They are inexpensive, roughly $15 for 2 tests.  There is a link below: [email or web address]  You can order 4 free COVID-19 home tests via [employer]

## 2020-10-12 NOTE — Assessment & Plan Note (Signed)
TSH On Levothroid 

## 2020-10-12 NOTE — Assessment & Plan Note (Signed)
Miralax prn 

## 2020-10-12 NOTE — Assessment & Plan Note (Addendum)
Cont w/bortezomib inj q 2 weeks Zometa q 3 mo

## 2020-10-18 ENCOUNTER — Telehealth (HOSPITAL_COMMUNITY): Payer: Self-pay | Admitting: Adult Health

## 2020-10-18 NOTE — Telephone Encounter (Signed)
Called patient to discuss his referral placed by Dr. [name] to receive Evusheld, a pre exposure prophylaxis to prevent COVID19 infection due to Andre [name]'s multiple myeloma.    I could not reach [name] today, so I left him a message to return my call to discuss further.  Wilber Bihari, NP

## 2020-10-24 ENCOUNTER — Other Ambulatory Visit: Payer: Self-pay | Admitting: Oncology

## 2020-10-25 ENCOUNTER — Inpatient Hospital Stay: Payer: Medicare Other

## 2020-10-25 ENCOUNTER — Other Ambulatory Visit: Payer: Self-pay

## 2020-10-25 ENCOUNTER — Inpatient Hospital Stay: Payer: Medicare Other | Attending: Physician Assistant

## 2020-10-25 VITALS — BP 122/74 | HR 64 | Temp 97.9°F | Resp 18 | Wt 180.8 lb

## 2020-10-25 DIAGNOSIS — Z9481 Bone marrow transplant status: Secondary | ICD-10-CM

## 2020-10-25 DIAGNOSIS — Z5112 Encounter for antineoplastic immunotherapy: Secondary | ICD-10-CM | POA: Diagnosis present

## 2020-10-25 DIAGNOSIS — C9000 Multiple myeloma not having achieved remission: Secondary | ICD-10-CM

## 2020-10-25 DIAGNOSIS — T451X5A Adverse effect of antineoplastic and immunosuppressive drugs, initial encounter: Secondary | ICD-10-CM

## 2020-10-25 DIAGNOSIS — R9089 Other abnormal findings on diagnostic imaging of central nervous system: Secondary | ICD-10-CM

## 2020-10-25 DIAGNOSIS — G620 Drug-induced polyneuropathy: Secondary | ICD-10-CM

## 2020-10-25 LAB — CMP (CANCER CENTER ONLY)
ALT: 16 U/L (ref 0–44)
AST: 23 U/L (ref 15–41)
Albumin: 4 g/dL (ref 3.5–5.0)
Alkaline Phosphatase: 64 U/L (ref 38–126)
Anion gap: 9 (ref 5–15)
BUN: 33 mg/dL — ABNORMAL HIGH (ref 8–23)
CO2: 23 mmol/L (ref 22–32)
Calcium: 9.5 mg/dL (ref 8.9–10.3)
Chloride: 104 mmol/L (ref 98–111)
Creatinine: 1.16 mg/dL (ref 0.61–1.24)
GFR, Estimated: >60 mL/min (ref >60)
Glucose, Bld: 84 mg/dL (ref 70–99)
Potassium: 4.4 mmol/L (ref 3.5–5.1)
Sodium: 136 mmol/L (ref 135–145)
Total Bilirubin: 0.3 mg/dL (ref 0.3–1.2)
Total Protein: 7.4 g/dL (ref 6.5–8.1)

## 2020-10-25 LAB — CBC WITH DIFFERENTIAL (CANCER CENTER ONLY)
Abs Immature Granulocytes: 0.01 10*3/uL (ref 0.00–0.07)
Basophils Absolute: 0 10*3/uL (ref 0.0–0.1)
Basophils Relative: 0 %
Eosinophils Absolute: 0.2 10*3/uL (ref 0.0–0.5)
Eosinophils Relative: 2 %
HCT: 33.5 % — ABNORMAL LOW (ref 39.0–52.0)
Hemoglobin: 11 g/dL — ABNORMAL LOW (ref 13.0–17.0)
Immature Granulocytes: 0 %
Lymphocytes Relative: 26 %
Lymphs Abs: 1.7 10*3/uL (ref 0.7–4.0)
MCH: 32.9 pg (ref 26.0–34.0)
MCHC: 32.8 g/dL (ref 30.0–36.0)
MCV: 100.3 fL — ABNORMAL HIGH (ref 80.0–100.0)
Monocytes Absolute: 0.7 10*3/uL (ref 0.1–1.0)
Monocytes Relative: 11 %
Neutro Abs: 3.9 10*3/uL (ref 1.7–7.7)
Neutrophils Relative %: 61 %
Platelet Count: 115 10*3/uL — ABNORMAL LOW (ref 150–400)
RBC: 3.34 MIL/uL — ABNORMAL LOW (ref 4.22–5.81)
RDW: 15 % (ref 11.5–15.5)
WBC Count: 6.4 10*3/uL (ref 4.0–10.5)
nRBC: 0 % (ref 0.0–0.2)

## 2020-10-25 MED ORDER — PROCHLORPERAZINE MALEATE 10 MG PO TABS: 10.0000 mg | ORAL_TABLET | Freq: Once | ORAL | Status: DC

## 2020-10-25 MED ORDER — BORTEZOMIB CHEMO SQ INJECTION 3.5 MG (2.5MG/ML)
1.3000 mg/m2 | Freq: Once | INTRAMUSCULAR | Status: AC
  Administered 2020-10-25: 2.5 mg via SUBCUTANEOUS
  Filled 2020-10-25: qty 1

## 2020-10-25 NOTE — Patient Instructions (Signed)
[facility] Discharge Instructions for Patients Receiving Chemotherapy  Today you received the following chemotherapy agents: bortezomib.  To help prevent nausea and vomiting after your treatment, we encourage you to take your nausea medication as directed.   If you develop nausea and vomiting that is not controlled by your nausea medication, call the clinic.   BELOW ARE SYMPTOMS THAT SHOULD BE REPORTED IMMEDIATELY:  *FEVER GREATER THAN 100.5 F  *CHILLS WITH OR WITHOUT FEVER  NAUSEA AND VOMITING THAT IS NOT CONTROLLED WITH YOUR NAUSEA MEDICATION  *UNUSUAL SHORTNESS OF BREATH  *UNUSUAL BRUISING OR BLEEDING  TENDERNESS IN MOUTH AND THROAT WITH OR WITHOUT PRESENCE OF ULCERS  *URINARY PROBLEMS  *BOWEL PROBLEMS  UNUSUAL RASH Items with * indicate a potential emergency and should be followed up as soon as possible.  Feel free to call the clinic should you have any questions or concerns. The clinic phone number is [phone].  Please show the CHEMO ALERT CARD at check-in to the Emergency Department and triage nurse.   

## 2020-10-26 LAB — PROTEIN ELECTROPHORESIS, SERUM
A/G Ratio: 1.1 (ref 0.7–1.7)
Albumin ELP: 3.7 g/dL (ref 2.9–4.4)
Alpha-1-Globulin: 0.2 g/dL (ref 0.0–0.4)
Alpha-2-Globulin: 0.7 g/dL (ref 0.4–1.0)
Beta Globulin: 1 g/dL (ref 0.7–1.3)
Gamma Globulin: 1.4 g/dL (ref 0.4–1.8)
Globulin, Total: 3.3 g/dL (ref 2.2–3.9)
M-Spike, %: 0.6 g/dL — ABNORMAL HIGH
Total Protein ELP: 7 g/dL (ref 6.0–8.5)

## 2020-10-26 LAB — KAPPA/LAMBDA LIGHT CHAINS
Kappa free light chain: 25.3 mg/L — ABNORMAL HIGH (ref 3.3–19.4)
Kappa, lambda light chain ratio: 0.57 (ref 0.26–1.65)
Lambda free light chains: 44.3 mg/L — ABNORMAL HIGH (ref 5.7–26.3)

## 2020-10-26 LAB — IGG: IgG (Immunoglobin G), Serum: 1241 mg/dL (ref 603–1613)

## 2020-10-27 ENCOUNTER — Encounter: Payer: Self-pay | Admitting: Oncology

## 2020-10-27 ENCOUNTER — Other Ambulatory Visit: Payer: Self-pay | Admitting: Oncology

## 2020-10-29 ENCOUNTER — Telehealth: Payer: Self-pay | Admitting: Oncology

## 2020-10-29 NOTE — Telephone Encounter (Signed)
Left message -   Scheduled appt per 2/10 sch msg - left message for patient on vmail with appt date and time

## 2020-11-01 ENCOUNTER — Inpatient Hospital Stay: Payer: Medicare Other

## 2020-11-01 ENCOUNTER — Other Ambulatory Visit: Payer: Self-pay

## 2020-11-01 VITALS — BP 128/65 | HR 63 | Temp 98.2°F | Resp 16 | Wt 181.5 lb

## 2020-11-01 DIAGNOSIS — R9089 Other abnormal findings on diagnostic imaging of central nervous system: Secondary | ICD-10-CM

## 2020-11-01 DIAGNOSIS — C9000 Multiple myeloma not having achieved remission: Secondary | ICD-10-CM

## 2020-11-01 DIAGNOSIS — Z5112 Encounter for antineoplastic immunotherapy: Secondary | ICD-10-CM | POA: Diagnosis not present

## 2020-11-01 DIAGNOSIS — T451X5A Adverse effect of antineoplastic and immunosuppressive drugs, initial encounter: Secondary | ICD-10-CM

## 2020-11-01 DIAGNOSIS — Z9481 Bone marrow transplant status: Secondary | ICD-10-CM

## 2020-11-01 LAB — CMP (CANCER CENTER ONLY)
ALT: 14 U/L (ref 0–44)
AST: 24 U/L (ref 15–41)
Albumin: 3.8 g/dL (ref 3.5–5.0)
Alkaline Phosphatase: 61 U/L (ref 38–126)
Anion gap: 5 (ref 5–15)
BUN: 30 mg/dL — ABNORMAL HIGH (ref 8–23)
CO2: 23 mmol/L (ref 22–32)
Calcium: 9.2 mg/dL (ref 8.9–10.3)
Chloride: 103 mmol/L (ref 98–111)
Creatinine: 1.1 mg/dL (ref 0.61–1.24)
GFR, Estimated: >60 mL/min (ref >60)
Glucose, Bld: 85 mg/dL (ref 70–99)
Potassium: 4.7 mmol/L (ref 3.5–5.1)
Sodium: 131 mmol/L — ABNORMAL LOW (ref 135–145)
Total Bilirubin: 0.3 mg/dL (ref 0.3–1.2)
Total Protein: 7.5 g/dL (ref 6.5–8.1)

## 2020-11-01 LAB — CBC WITH DIFFERENTIAL (CANCER CENTER ONLY)
Abs Immature Granulocytes: 0.01 10*3/uL (ref 0.00–0.07)
Basophils Absolute: 0 10*3/uL (ref 0.0–0.1)
Basophils Relative: 0 %
Eosinophils Absolute: 0.2 10*3/uL (ref 0.0–0.5)
Eosinophils Relative: 3 %
HCT: 33.2 % — ABNORMAL LOW (ref 39.0–52.0)
Hemoglobin: 11.1 g/dL — ABNORMAL LOW (ref 13.0–17.0)
Immature Granulocytes: 0 %
Lymphocytes Relative: 26 %
Lymphs Abs: 1.6 10*3/uL (ref 0.7–4.0)
MCH: 33.4 pg (ref 26.0–34.0)
MCHC: 33.4 g/dL (ref 30.0–36.0)
MCV: 100 fL (ref 80.0–100.0)
Monocytes Absolute: 0.7 10*3/uL (ref 0.1–1.0)
Monocytes Relative: 12 %
Neutro Abs: 3.6 10*3/uL (ref 1.7–7.7)
Neutrophils Relative %: 59 %
Platelet Count: 106 10*3/uL — ABNORMAL LOW (ref 150–400)
RBC: 3.32 MIL/uL — ABNORMAL LOW (ref 4.22–5.81)
RDW: 15 % (ref 11.5–15.5)
WBC Count: 6.1 10*3/uL (ref 4.0–10.5)
nRBC: 0 % (ref 0.0–0.2)

## 2020-11-01 MED ORDER — PROCHLORPERAZINE MALEATE 10 MG PO TABS: 10.0000 mg | ORAL_TABLET | Freq: Once | ORAL | Status: DC

## 2020-11-01 MED ORDER — PROCHLORPERAZINE MALEATE 10 MG PO TABS
ORAL_TABLET | ORAL | Status: AC
  Filled 2020-11-01: qty 1

## 2020-11-01 MED ORDER — BORTEZOMIB CHEMO SQ INJECTION 3.5 MG (2.5MG/ML)
1.3000 mg/m2 | Freq: Once | INTRAMUSCULAR | Status: AC
  Administered 2020-11-01: 2.5 mg via SUBCUTANEOUS
  Filled 2020-11-01: qty 1

## 2020-11-01 NOTE — Patient Instructions (Signed)
[facility] Discharge Instructions for Patients Receiving Chemotherapy  Today you received the following chemotherapy agent: Bortezomib (Velcade)  To help prevent nausea and vomiting after your treatment, we encourage you to take your nausea medication as directed by your MD.   If you develop nausea and vomiting that is not controlled by your nausea medication, call the clinic.   BELOW ARE SYMPTOMS THAT SHOULD BE REPORTED IMMEDIATELY:  *FEVER GREATER THAN 100.5 F  *CHILLS WITH OR WITHOUT FEVER  NAUSEA AND VOMITING THAT IS NOT CONTROLLED WITH YOUR NAUSEA MEDICATION  *UNUSUAL SHORTNESS OF BREATH  *UNUSUAL BRUISING OR BLEEDING  TENDERNESS IN MOUTH AND THROAT WITH OR WITHOUT PRESENCE OF ULCERS  *URINARY PROBLEMS  *BOWEL PROBLEMS  UNUSUAL RASH Items with * indicate a potential emergency and should be followed up as soon as possible.  Feel free to call the clinic should you have any questions or concerns. The clinic phone number is [phone].  Please show the CHEMO ALERT CARD at check-in to the Emergency Department and triage nurse.   

## 2020-11-02 LAB — KAPPA/LAMBDA LIGHT CHAINS
Kappa free light chain: 25.1 mg/L — ABNORMAL HIGH (ref 3.3–19.4)
Kappa, lambda light chain ratio: 0.66 (ref 0.26–1.65)
Lambda free light chains: 37.8 mg/L — ABNORMAL HIGH (ref 5.7–26.3)

## 2020-11-02 LAB — IGG: IgG (Immunoglobin G), Serum: 1190 mg/dL (ref 603–1613)

## 2020-11-03 LAB — PROTEIN ELECTROPHORESIS, SERUM
A/G Ratio: 1.1 (ref 0.7–1.7)
Albumin ELP: 3.5 g/dL (ref 2.9–4.4)
Alpha-1-Globulin: 0.2 g/dL (ref 0.0–0.4)
Alpha-2-Globulin: 0.7 g/dL (ref 0.4–1.0)
Beta Globulin: 1 g/dL (ref 0.7–1.3)
Gamma Globulin: 1.2 g/dL (ref 0.4–1.8)
Globulin, Total: 3.2 g/dL (ref 2.2–3.9)
M-Spike, %: 0.4 g/dL — ABNORMAL HIGH
Total Protein ELP: 6.7 g/dL (ref 6.0–8.5)

## 2020-11-05 ENCOUNTER — Other Ambulatory Visit: Payer: Self-pay | Admitting: Oncology

## 2020-11-06 ENCOUNTER — Other Ambulatory Visit: Payer: Self-pay | Admitting: Internal Medicine

## 2020-11-08 ENCOUNTER — Inpatient Hospital Stay: Payer: Medicare Other

## 2020-11-08 ENCOUNTER — Other Ambulatory Visit: Payer: Self-pay

## 2020-11-08 VITALS — BP 136/81 | HR 59 | Temp 97.8°F | Resp 18 | Ht 69.0 in | Wt 179.0 lb

## 2020-11-08 DIAGNOSIS — C9000 Multiple myeloma not having achieved remission: Secondary | ICD-10-CM

## 2020-11-08 DIAGNOSIS — Z5112 Encounter for antineoplastic immunotherapy: Secondary | ICD-10-CM | POA: Diagnosis not present

## 2020-11-08 DIAGNOSIS — Z9481 Bone marrow transplant status: Secondary | ICD-10-CM

## 2020-11-08 LAB — CMP (CANCER CENTER ONLY)
ALT: 16 U/L (ref 0–44)
AST: 24 U/L (ref 15–41)
Albumin: 3.8 g/dL (ref 3.5–5.0)
Alkaline Phosphatase: 61 U/L (ref 38–126)
Anion gap: 8 (ref 5–15)
BUN: 35 mg/dL — ABNORMAL HIGH (ref 8–23)
CO2: 24 mmol/L (ref 22–32)
Calcium: 9.6 mg/dL (ref 8.9–10.3)
Chloride: 102 mmol/L (ref 98–111)
Creatinine: 1.23 mg/dL (ref 0.61–1.24)
GFR, Estimated: >60 mL/min (ref >60)
Glucose, Bld: 89 mg/dL (ref 70–99)
Potassium: 4.5 mmol/L (ref 3.5–5.1)
Sodium: 134 mmol/L — ABNORMAL LOW (ref 135–145)
Total Bilirubin: 0.3 mg/dL (ref 0.3–1.2)
Total Protein: 7.4 g/dL (ref 6.5–8.1)

## 2020-11-08 LAB — CBC WITH DIFFERENTIAL (CANCER CENTER ONLY)
Abs Immature Granulocytes: 0.01 10*3/uL (ref 0.00–0.07)
Basophils Absolute: 0 10*3/uL (ref 0.0–0.1)
Basophils Relative: 0 %
Eosinophils Absolute: 0.2 10*3/uL (ref 0.0–0.5)
Eosinophils Relative: 3 %
HCT: 33.6 % — ABNORMAL LOW (ref 39.0–52.0)
Hemoglobin: 11.3 g/dL — ABNORMAL LOW (ref 13.0–17.0)
Immature Granulocytes: 0 %
Lymphocytes Relative: 34 %
Lymphs Abs: 1.8 10*3/uL (ref 0.7–4.0)
MCH: 33.5 pg (ref 26.0–34.0)
MCHC: 33.6 g/dL (ref 30.0–36.0)
MCV: 99.7 fL (ref 80.0–100.0)
Monocytes Absolute: 0.7 10*3/uL (ref 0.1–1.0)
Monocytes Relative: 13 %
Neutro Abs: 2.6 10*3/uL (ref 1.7–7.7)
Neutrophils Relative %: 50 %
Platelet Count: 94 10*3/uL — ABNORMAL LOW (ref 150–400)
RBC: 3.37 MIL/uL — ABNORMAL LOW (ref 4.22–5.81)
RDW: 15 % (ref 11.5–15.5)
WBC Count: 5.3 10*3/uL (ref 4.0–10.5)
nRBC: 0 % (ref 0.0–0.2)

## 2020-11-08 MED ORDER — BORTEZOMIB CHEMO SQ INJECTION 3.5 MG (2.5MG/ML)
1.3000 mg/m2 | Freq: Once | INTRAMUSCULAR | Status: AC
Start: 2020-11-08 — End: 2020-11-08
  Administered 2020-11-08: 2.5 mg via SUBCUTANEOUS
  Filled 2020-11-08: qty 1

## 2020-11-08 MED ORDER — PROCHLORPERAZINE MALEATE 10 MG PO TABS: 10.0000 mg | ORAL_TABLET | Freq: Once | ORAL | Status: DC

## 2020-11-08 MED ORDER — ZOLEDRONIC ACID 4 MG/100ML IV SOLN: 4.0000 mg | Freq: Once | INTRAVENOUS | Status: DC

## 2020-11-08 NOTE — Patient Instructions (Signed)
[facility] Discharge Instructions for Patients Receiving Chemotherapy  Today you received the following chemotherapy agents velcade  To help prevent nausea and vomiting after your treatment, we encourage you to take your nausea medication as directed.   If you develop nausea and vomiting that is not controlled by your nausea medication, call the clinic.   BELOW ARE SYMPTOMS THAT SHOULD BE REPORTED IMMEDIATELY:  *FEVER GREATER THAN 100.5 F  *CHILLS WITH OR WITHOUT FEVER  NAUSEA AND VOMITING THAT IS NOT CONTROLLED WITH YOUR NAUSEA MEDICATION  *UNUSUAL SHORTNESS OF BREATH  *UNUSUAL BRUISING OR BLEEDING  TENDERNESS IN MOUTH AND THROAT WITH OR WITHOUT PRESENCE OF ULCERS  *URINARY PROBLEMS  *BOWEL PROBLEMS  UNUSUAL RASH Items with * indicate a potential emergency and should be followed up as soon as possible.  Feel free to call the clinic should you have any questions or concerns. The clinic phone number is [phone].  Please show the CHEMO ALERT CARD at check-in to the Emergency Department and triage nurse.   

## 2020-11-08 NOTE — Progress Notes (Signed)
Per Dr. [name] OK to treat with platelets 94

## 2020-11-15 ENCOUNTER — Other Ambulatory Visit: Payer: Self-pay

## 2020-11-15 ENCOUNTER — Inpatient Hospital Stay: Payer: Medicare Other

## 2020-11-15 VITALS — BP 140/76 | HR 63 | Temp 97.7°F | Resp 18 | Wt 179.2 lb

## 2020-11-15 DIAGNOSIS — Z9481 Bone marrow transplant status: Secondary | ICD-10-CM

## 2020-11-15 DIAGNOSIS — C9000 Multiple myeloma not having achieved remission: Secondary | ICD-10-CM

## 2020-11-15 DIAGNOSIS — G620 Drug-induced polyneuropathy: Secondary | ICD-10-CM

## 2020-11-15 DIAGNOSIS — R9089 Other abnormal findings on diagnostic imaging of central nervous system: Secondary | ICD-10-CM

## 2020-11-15 DIAGNOSIS — T451X5A Adverse effect of antineoplastic and immunosuppressive drugs, initial encounter: Secondary | ICD-10-CM

## 2020-11-15 DIAGNOSIS — Z5112 Encounter for antineoplastic immunotherapy: Secondary | ICD-10-CM | POA: Diagnosis not present

## 2020-11-15 LAB — CBC WITH DIFFERENTIAL (CANCER CENTER ONLY)
Abs Immature Granulocytes: 0 10*3/uL (ref 0.00–0.07)
Basophils Absolute: 0 10*3/uL (ref 0.0–0.1)
Basophils Relative: 0 %
Eosinophils Absolute: 0.1 10*3/uL (ref 0.0–0.5)
Eosinophils Relative: 2 %
HCT: 33.4 % — ABNORMAL LOW (ref 39.0–52.0)
Hemoglobin: 11.2 g/dL — ABNORMAL LOW (ref 13.0–17.0)
Immature Granulocytes: 0 %
Lymphocytes Relative: 30 %
Lymphs Abs: 1.6 10*3/uL (ref 0.7–4.0)
MCH: 33.3 pg (ref 26.0–34.0)
MCHC: 33.5 g/dL (ref 30.0–36.0)
MCV: 99.4 fL (ref 80.0–100.0)
Monocytes Absolute: 0.6 10*3/uL (ref 0.1–1.0)
Monocytes Relative: 12 %
Neutro Abs: 3.1 10*3/uL (ref 1.7–7.7)
Neutrophils Relative %: 56 %
Platelet Count: 87 10*3/uL — ABNORMAL LOW (ref 150–400)
RBC: 3.36 MIL/uL — ABNORMAL LOW (ref 4.22–5.81)
RDW: 15.2 % (ref 11.5–15.5)
WBC Count: 5.5 10*3/uL (ref 4.0–10.5)
nRBC: 0 % (ref 0.0–0.2)

## 2020-11-15 LAB — CMP (CANCER CENTER ONLY)
ALT: 14 U/L (ref 0–44)
AST: 23 U/L (ref 15–41)
Albumin: 3.9 g/dL (ref 3.5–5.0)
Alkaline Phosphatase: 59 U/L (ref 38–126)
Anion gap: 7 (ref 5–15)
BUN: 35 mg/dL — ABNORMAL HIGH (ref 8–23)
CO2: 23 mmol/L (ref 22–32)
Calcium: 9.6 mg/dL (ref 8.9–10.3)
Chloride: 106 mmol/L (ref 98–111)
Creatinine: 1.21 mg/dL (ref 0.61–1.24)
GFR, Estimated: >60 mL/min (ref >60)
Glucose, Bld: 83 mg/dL (ref 70–99)
Potassium: 4.5 mmol/L (ref 3.5–5.1)
Sodium: 136 mmol/L (ref 135–145)
Total Bilirubin: 0.2 mg/dL — ABNORMAL LOW (ref 0.3–1.2)
Total Protein: 7.6 g/dL (ref 6.5–8.1)

## 2020-11-15 MED ORDER — BORTEZOMIB CHEMO SQ INJECTION 3.5 MG (2.5MG/ML)
1.3000 mg/m2 | Freq: Once | INTRAMUSCULAR | Status: AC
  Administered 2020-11-15: 2.5 mg via SUBCUTANEOUS
  Filled 2020-11-15: qty 1

## 2020-11-15 MED ORDER — PROCHLORPERAZINE MALEATE 10 MG PO TABS: 10.0000 mg | ORAL_TABLET | Freq: Once | ORAL | Status: DC

## 2020-11-15 NOTE — Patient Instructions (Signed)
[facility] Discharge Instructions for Patients Receiving Chemotherapy  Today you received the following chemotherapy agents velcade  To help prevent nausea and vomiting after your treatment, we encourage you to take your nausea medication as directed.   If you develop nausea and vomiting that is not controlled by your nausea medication, call the clinic.   BELOW ARE SYMPTOMS THAT SHOULD BE REPORTED IMMEDIATELY:  *FEVER GREATER THAN 100.5 F  *CHILLS WITH OR WITHOUT FEVER  NAUSEA AND VOMITING THAT IS NOT CONTROLLED WITH YOUR NAUSEA MEDICATION  *UNUSUAL SHORTNESS OF BREATH  *UNUSUAL BRUISING OR BLEEDING  TENDERNESS IN MOUTH AND THROAT WITH OR WITHOUT PRESENCE OF ULCERS  *URINARY PROBLEMS  *BOWEL PROBLEMS  UNUSUAL RASH Items with * indicate a potential emergency and should be followed up as soon as possible.  Feel free to call the clinic should you have any questions or concerns. The clinic phone number is [phone].  Please show the CHEMO ALERT CARD at check-in to the Emergency Department and triage nurse.   

## 2020-11-15 NOTE — Progress Notes (Signed)
Per Dr. [name] okay to treat with PLT 87.

## 2020-11-16 LAB — IGG: IgG (Immunoglobin G), Serum: 1251 mg/dL (ref 603–1613)

## 2020-11-30 ENCOUNTER — Telehealth: Payer: Self-pay

## 2020-11-30 NOTE — Telephone Encounter (Signed)
Attempted to return call to pt. Pt called and left message that he had been exposed to someone with Covid. Pt stated on message he plans to test tomorrow.  Left pt voicemail to contact us back if he tests positive. Also told pt if he tests negative but later develops symptoms then to retest.

## 2020-12-04 NOTE — Progress Notes (Signed)
[place]  [phone] [phone]    ID: Andre [name]  DOB: 1951-04-25  MR#: [id number] CSN#: [id number]   Patient Care Team: Cassandria Anger, MD as PCP - General (Internal Medicine) Daleiza Bacchi, [name], MD as Consulting Physician (Oncology) Concepcion Living, MD as Consulting Physician (Neurology) [name], MD as Consulting Physician (Internal Medicine) Kathrynn Ducking, MD as Consulting Physician (Neurology) Dorna Bloom, DDS as Referring Physician (Oral Surgery) OTHERS: Augustina Mood DDS   CHIEF COMPLAINT: Multiple myeloma  CURRENT TREATMENT: bortezomib every 14 days, zoledronate every 12 weeks   INTERVAL HISTORY:  Andre [name] returns today for follow-up and treatment of his multiple myeloma.   He continues on bortezomib.  After a bump in his M spike we increased the dosing frequency to weekly through February.  He returns today, 3 weeks after the fourth dose, for further treatment and a discussion of future therapy.  He also continues on zolendronate, which received today 12/06/2020.  He tolerates this well.  Lab Results  Component Value Date   TOTALPROTELP 6.7 11/01/2020   ALBUMINELP 3.5 11/01/2020   A1GS 0.2 11/01/2020   A2GS 0.7 11/01/2020   BETS 1.0 11/01/2020   BETA2SER 0.4 05/11/2015   GAMS 1.2 11/01/2020   MSPIKE 0.4 (H) 11/01/2020   SPEI Comment 11/01/2020    Lab Results  Component Value Date   KPAFRELGTCHN 25.1 (H) 11/01/2020   LAMBDASER 37.8 (H) 11/01/2020   KAPLAMBRATIO 0.66 11/01/2020     REVIEW OF SYSTEMS: Andre [name] tells me he felt pretty exhausted when receiving the bortezomib weekly last month.  He says he has no peripheral neuropathy.  His constipation, which is chronic, is "about the same".  He is sleeping well.  He is exercising more, at least 6 days a week he says and for at least an hour a day.  Aside from these issues a detailed review of systems today was stable   COVID 19  VACCINATION STATUS: [place] x2, most recently 11/2019   HISTORY OF PRESENT ILLNESS:  From the original intake nodes:  Andre [name] was worked up for peptic ulcer disease in August of 2011, with significant bleeding and anemia.  The patient was Helicobacter pylori negative.  He received some epinephrine when he had his EGD and then started on Protonix.  [name] anemia slowly resolved so that by September, his hemoglobin was up to 10.9 and by earlier this month, his hemoglobin was up to 12.5.    As part of his general workup, he was found to have a slightly elevated globulin fraction.  In September, his total protein was 8.3 with an albumin of 3.8.  In January, the total protein was 8.4 and albumin 3.6.  With persistence of this slight abnormality, Dr. Olevia Perches obtained serum immunofixation and SPEP.  The SPTP showed an M-spike of 2.67 grams.  A total IgG was 4,190.  Total IgA low at 28.  Total IgM low at 28 also.  The immunofixation showed a monoclonal IgG lambda paraprotein.  There were also monoclonal free lambda light chains present. With this information, the patient was referred for further evaluation.   A diagnosis of myeloma was confirmed by bone marrow biopsy and subsequebnt treatment is as detailed below   PAST MEDICAL HISTORY: Past Medical History:  Diagnosis Date   Allergy    Anemia    Arthritis    Asthma    no treatment x 20 years   Depression    Double vision  occurs at times    Duodenal ulcer    GERD (gastroesophageal reflux disease)    Hyperlipidemia    Hypertension    Hypothyroidism    Multiple myeloma 07/04/2011   Thyroid disease   Significant for peptic ulcer disease as noted above. History of anxiety and depression.  History of GERD which is significantly improved with weight loss and history of reactive airway disease, possibly secondary to the GERD which also improved with weight loss.  There was a history of obesity, now much improved secondary  to exercise and diet.    PAST SURGICAL HISTORY: Past Surgical History:  Procedure Laterality Date   BONE MARROW TRANSPLANT  2011   for MM   CARDIOLITE STUDY  11/25/2003   NORMAL   TONSILLECTOMY      FAMILY HISTORY: Family History  Problem Relation Age of Onset   Throat cancer Mother    Hypertension Father    Stroke Father    Asthma Father    Diabetes Father    Pancreatic cancer Brother   [name] father died at the age of 91 following a stroke.  [name] mother died with cancer of the throat at the age of 22.   She had a history of depression and was a smoker.  Not clear how much alcohol she drank, according to the patient.  The patient had two brothers.  One died from the age of 14 from a "kidney problem."  The second one died at the age of 73 from pancreatic cancer. (This may have been duodenal cancer. 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  His wife of 37+ years was [name], but they divorced. In 2016 he remarried, his current wife's name is [name].  The patient has two daughters:  [name], who works in [place], who had her second child April 2016. Both daughters live here in [place].  [place] first daughter, [name], born 03/06/2013, apparently has Weber/Osler/Rendu. The patient is not a church attender.  Andre [name] also participates in [place] even though actually there is no alcohol or drug problem in the family.     ADVANCED DIRECTIVES: In place   HEALTH MAINTENANCE:  Social History   Tobacco Use   Smoking status: Former Smoker    Years: 3.00    Quit date: 03/27/1969    Years since quitting: 51.7   Smokeless tobacco: Never Used  Substance Use Topics   Alcohol use: No   Drug use: No    Colonoscopy: January 2015  PSA: December 2018, normal/Dr.  [name]  Lipid panel: per Dr. Alain Marion  Allergies  Allergen Reactions   Atorvastatin Other (See Comments)   Rosuvastatin     Other reaction(s): Liver Disorder   Crestor [Rosuvastatin Calcium]     ADVERSE EFFECTS ON LIVER   Lipitor [Atorvastatin Calcium]    Septra [Sulfamethoxazole-Trimethoprim] Rash   Current Outpatient Medications  Medication Sig Dispense Refill   calcium-vitamin D (OSCAL WITH D) 500-200 MG-UNIT tablet Take 1 tablet by mouth daily.     cetirizine (ZYRTEC) 10 MG tablet Take 10 mg by mouth daily.     Cholecalciferol (VITAMIN D3) 2000 units capsule Take 1 capsule (2,000 Units total) by mouth daily. 100 capsule 3   docusate sodium (COLACE) 100 MG capsule Take 100 mg by mouth daily as needed  for mild constipation or moderate constipation.     escitalopram (LEXAPRO) 10 MG tablet Take 1 tablet (10 mg total) by mouth daily. 90 tablet 3   HYDROCORTISONE ACE, RECTAL, 30 MG SUPP 1 pr bid x 7 d prn 14 suppository 3   levothyroxine (SYNTHROID) 150 MCG tablet TAKE 1 TABLET BY MOUTH EVERY MORNING ON AN EMPTY STOMACH 90 tablet 3   ondansetron (ZOFRAN) 8 MG tablet TAKE 1 TABLET(8 MG) BY MOUTH EVERY 8 HOURS AS NEEDED FOR NAUSEA OR VOMITING 20 tablet 3   pantoprazole (PROTONIX) 40 MG tablet TAKE 1 TABLET BY MOUTH  TWICE A DAY BEFORE MEALS 180 tablet 3   triamcinolone ointment (KENALOG) 0.1 % Apply 1 application topically 2 (two) times daily. 80 g 2   valACYclovir (VALTREX) 1000 MG tablet TAKE 1 TABLET(1000 MG) BY MOUTH DAILY 90 tablet 3   zolendronic acid (ZOMETA) 4 MG/5ML injection Inject 4 mg into the vein every 3 (three) months.     No current facility-administered medications for this visit.   Facility-Administered Medications Ordered in Other Visits  Medication Dose Route Frequency Provider Last Rate Last Admin   prochlorperazine (COMPAZINE) tablet 10 mg  10 mg Oral Once Elan Brainerd, [name], MD       prochlorperazine (COMPAZINE) tablet 10 mg  10 mg Oral Once  Winford Hehn, [name], MD       Objective: white man who appears stated age 36:   12/06/20 1418  BP: 131/71  Pulse: 79  Resp: 17  Temp: 97.9 F (36.6 C)  SpO2: 96%   Wt Readings from Last 3 Encounters:  12/06/20 178 lb 9.6 oz (81 kg)  11/15/20 179 lb 4 oz (81.3 kg)  11/08/20 179 lb (81.2 kg)   Body mass index is 26.37 kg/m.    ECOG FS:1 - Symptomatic but completely ambulatory  Sclerae unicteric, EOMs intact Wearing a mask No cervical or supraclavicular adenopathy Lungs no rales or rhonchi Heart regular rate and rhythm Abd soft, nontender, positive bowel sounds MSK no focal spinal tenderness, no upper extremity lymphedema Neuro: nonfocal, well oriented, appropriate affect   LAB RESULTS:  Appointment on 12/06/2020  Component Date Value Ref Range Status   Sodium 12/06/2020 136  135 - 145 mmol/L Final   Potassium 12/06/2020 4.9  3.5 - 5.1 mmol/L Final   Chloride 12/06/2020 104  98 - 111 mmol/L Final   CO2 12/06/2020 24  22 - 32 mmol/L Final   Glucose, Bld 12/06/2020 81  70 - 99 mg/dL Final   Glucose reference range applies only to samples taken after fasting for at least 8 hours.   BUN 12/06/2020 41* 8 - 23 mg/dL Final   Creatinine 12/06/2020 1.19  0.61 - 1.24 mg/dL Final   Calcium 12/06/2020 9.6  8.9 - 10.3 mg/dL Final   Total Protein 12/06/2020 7.8  6.5 - 8.1 g/dL Final   Albumin 12/06/2020 3.9  3.5 - 5.0 g/dL Final   AST 12/06/2020 23  15 - 41 U/L Final   ALT 12/06/2020 16  0 - 44 U/L Final   Alkaline Phosphatase 12/06/2020 62  38 - 126 U/L Final   Total Bilirubin 12/06/2020 0.3  0.3 - 1.2 mg/dL Final   GFR, Estimated 12/06/2020 >60  >60 mL/min Final   Comment: (NOTE) Calculated using the CKD-EPI Creatinine Equation (2021)    Anion gap 12/06/2020 8  5 - 15 Final   Performed at [facility], [place] [address], [place], [place] [address]   WBC  Count 12/06/2020 5.9  4.0 - 10.5 K/uL Final   RBC 12/06/2020 3.36* 4.22 - 5.81  MIL/uL Final   Hemoglobin 12/06/2020 11.1* 13.0 - 17.0 g/dL Final   HCT 12/06/2020 33.3* 39.0 - 52.0 % Final   MCV 12/06/2020 99.1  80.0 - 100.0 fL Final   MCH 12/06/2020 33.0  26.0 - 34.0 pg Final   MCHC 12/06/2020 33.3  30.0 - 36.0 g/dL Final   RDW 12/06/2020 15.6* 11.5 - 15.5 % Final   Platelet Count 12/06/2020 117* 150 - 400 K/uL Final   nRBC 12/06/2020 0.0  0.0 - 0.2 % Final   Neutrophils Relative % 12/06/2020 54  % Final   Neutro Abs 12/06/2020 3.2  1.7 - 7.7 K/uL Final   Lymphocytes Relative 12/06/2020 30  % Final   Lymphs Abs 12/06/2020 1.7  0.7 - 4.0 K/uL Final   Monocytes Relative 12/06/2020 13  % Final   Monocytes Absolute 12/06/2020 0.8  0.1 - 1.0 K/uL Final   Eosinophils Relative 12/06/2020 2  % Final   Eosinophils Absolute 12/06/2020 0.1  0.0 - 0.5 K/uL Final   Basophils Relative 12/06/2020 1  % Final   Basophils Absolute 12/06/2020 0.0  0.0 - 0.1 K/uL Final   Immature Granulocytes 12/06/2020 0  % Final   Abs Immature Granulocytes 12/06/2020 0.01  0.00 - 0.07 K/uL Final   Performed at [facility], [place] [address] [place], [place] [address]    STUDIES: No results found.    ASSESSMENT: 70 y.o. [employer] man with a history of multiple myeloma diagnosed February of 2012, with an initial M-spike of 2.6 g/dL, IFE showing an IgG lambda paraprotein and free lambda light chains in the urine. Cytogenetics showed trisomy 22. Bone marrow biopsy showed a 22% plasmacytosis. Treated with   (1) bortezomib (subcutaneously), lenalidomide, and dexamethasone, with repeat bone marrow biopsy May of 2012 showing 10% plasmacytosis   (2) high-dose chemotherapy with BCNU and melphalan at [facility], followed by stem cell rescue July of 2012   (3) on zoledronic acid started December of 2012, initially monthly, currently given every 3 months, most recent dose  12/07/2015  (4) low-dose lenalidomide resumed April 2013, interrupted several times.  Resumed  again on 02/19/2013, ata dose of 5 mg daily, 21 days on and 7 days off, later further reduced to 7 days on, 7 days off  (5) CNS symptoms and abnormal brain MRI extensively evaluated by neurology with no definitive diagnosis established  (6) rising M spike noted June 2014 but did not meet criteria for carfilzomib study  (7) on lenalidomide 25 mg daily, 14 days on, 7 days off, starting 04/18/2013, interrupted December 2014 because of rash;   (a) resumed January 2015 at 10 mg/ day at 21 days on/ 7 days off  (Andre [name]) starting 08/18/2014 decreased to 10 mg/ day 14 days on, 7 days off because of cytopenias  (c) as of February 2016 was on 5 mg daily 7 days on 7 days off  (d) lenalidomide discontinued December 2016 with evidence of disease progression  (8) transient global amnesia 05/29/2015, resolved without intervention  (9) starting PVD 10/25/2015 w ASA 325 thromboprophylaxis, valacyclovir prophylaxis, last dose 12/17/2015  (a) pomalidomide 4 mg/d days 1-14  (Andre [name]) bortezomib sQ days 2,5,9,12 of each 21 day cycle  (c) dexamethasome 20 mg two days a week  (d) dexamethasone bortezomib and pomalidomide discontinued late December 2018 with poor tolerance  (10) metapneumovirus pneumonia April 2017  (a) completing course of steroids and  week of bactrim mid April 2017  (11) status post second autologous [facility] at [facility] 02/04/2016(preparatory regimen melphalan 200 mg/m)  (a) received twelve-month vaccinations 03/14/2017 (DPT, Haemophilus, Pneumovax 13, polio)  (Andre [name]) 14 months injections 05/04/2016 include DTaP, Hib conjugate, HepB energex Andre [name] 20 mcg/ml, Prevnar 13  (c) 24 month vaccines due at [facility] June 2019  (12) maintenance therapy started November 2017, consisting of  (a) bortezomib 1.3 mg/M2 every 14 days, first dose 07/27/2016  (Andre [name]) pomalidomide 1 mg days 1-21 Q28 days, started 07/19/2016  (c) zolendronate monthly started 07/27/2016 (previously Q 3 months) however patient unable to tolerate, and changed  back to q3 months in April, 2018   (d) Bortezomib changed to monthly as of June 2018 because of tolerance issues, however discontinued after 09/14/2017 dose because of a rise in his M spike  (e) pomalidomide held after 10/18/2017 in preparation for possible study at [facility] (venetoclax)--never resumed  (f) with numbers actually improved off treatment, resumed every 2-week bortezomib 12/25/2017  (g) changed to every 3-week bortezomib as of 03/17/2019  (h) briefly on weekly treatments times 22 October 2020 due to increase in M spike   PLAN:  Andre [name] is now just 10 years out from initial diagnosis of multiple myeloma.  His disease is generally well controlled on bortezomib and zoledronate.  He tolerates these well.  We are going to go with every 2-week bortezomib and follow the M spike.  If this holds steady, we will try to go back to every 3 weeks.  He was more comfortable with that schedule.  I did discuss with Andre [name] that we have many other options that we could try for example daratumumab.  He is very comfortable with the treatments he has been receiving and is very reluctant to make a change.  He does qualify for Evusheld and we will try to get that scheduled for him with his next bortezomib treatment.  Total encounter time 25 minutes.*    Bessie Livingood, [name], MD  12/06/20 5:52 PM Medical Oncology and Hematology [facility] [place], [place] [address] Tel. [phone]    Fax. [phone]    I, Andre [name], am acting as scribe for Dr. [name]. Andre [name].  I, Lurline Del MD, have reviewed the above documentation for accuracy and completeness, and I agree with the above.   *Total Encounter Time as defined by the Centers for Medicare and Medicaid Services includes, in addition to the face-to-face time of a patient visit (documented in the note above) non-face-to-face time: obtaining and reviewing outside history, ordering and reviewing medications, tests  or procedures, care coordination (communications with other health care professionals or caregivers) and documentation in the medical record.

## 2020-12-06 ENCOUNTER — Encounter: Payer: Self-pay | Admitting: Adult Health

## 2020-12-06 ENCOUNTER — Inpatient Hospital Stay: Payer: Medicare Other | Attending: Oncology

## 2020-12-06 ENCOUNTER — Inpatient Hospital Stay: Payer: Medicare Other

## 2020-12-06 ENCOUNTER — Inpatient Hospital Stay (HOSPITAL_BASED_OUTPATIENT_CLINIC_OR_DEPARTMENT_OTHER): Payer: Medicare Other | Admitting: Oncology

## 2020-12-06 ENCOUNTER — Other Ambulatory Visit: Payer: Self-pay

## 2020-12-06 VITALS — BP 131/71 | HR 79 | Temp 97.9°F | Resp 17 | Ht 69.0 in | Wt 178.6 lb

## 2020-12-06 DIAGNOSIS — Z9481 Bone marrow transplant status: Secondary | ICD-10-CM

## 2020-12-06 DIAGNOSIS — Z5112 Encounter for antineoplastic immunotherapy: Secondary | ICD-10-CM | POA: Diagnosis present

## 2020-12-06 DIAGNOSIS — C9000 Multiple myeloma not having achieved remission: Secondary | ICD-10-CM

## 2020-12-06 DIAGNOSIS — T451X5A Adverse effect of antineoplastic and immunosuppressive drugs, initial encounter: Secondary | ICD-10-CM

## 2020-12-06 DIAGNOSIS — R9089 Other abnormal findings on diagnostic imaging of central nervous system: Secondary | ICD-10-CM

## 2020-12-06 DIAGNOSIS — G620 Drug-induced polyneuropathy: Secondary | ICD-10-CM

## 2020-12-06 LAB — CMP (CANCER CENTER ONLY)
ALT: 16 U/L (ref 0–44)
AST: 23 U/L (ref 15–41)
Albumin: 3.9 g/dL (ref 3.5–5.0)
Alkaline Phosphatase: 62 U/L (ref 38–126)
Anion gap: 8 (ref 5–15)
BUN: 41 mg/dL — ABNORMAL HIGH (ref 8–23)
CO2: 24 mmol/L (ref 22–32)
Calcium: 9.6 mg/dL (ref 8.9–10.3)
Chloride: 104 mmol/L (ref 98–111)
Creatinine: 1.19 mg/dL (ref 0.61–1.24)
GFR, Estimated: >60 mL/min (ref >60)
Glucose, Bld: 81 mg/dL (ref 70–99)
Potassium: 4.9 mmol/L (ref 3.5–5.1)
Sodium: 136 mmol/L (ref 135–145)
Total Bilirubin: 0.3 mg/dL (ref 0.3–1.2)
Total Protein: 7.8 g/dL (ref 6.5–8.1)

## 2020-12-06 LAB — CBC WITH DIFFERENTIAL (CANCER CENTER ONLY)
Abs Immature Granulocytes: 0.01 10*3/uL (ref 0.00–0.07)
Basophils Absolute: 0 10*3/uL (ref 0.0–0.1)
Basophils Relative: 1 %
Eosinophils Absolute: 0.1 10*3/uL (ref 0.0–0.5)
Eosinophils Relative: 2 %
HCT: 33.3 % — ABNORMAL LOW (ref 39.0–52.0)
Hemoglobin: 11.1 g/dL — ABNORMAL LOW (ref 13.0–17.0)
Immature Granulocytes: 0 %
Lymphocytes Relative: 30 %
Lymphs Abs: 1.7 10*3/uL (ref 0.7–4.0)
MCH: 33 pg (ref 26.0–34.0)
MCHC: 33.3 g/dL (ref 30.0–36.0)
MCV: 99.1 fL (ref 80.0–100.0)
Monocytes Absolute: 0.8 10*3/uL (ref 0.1–1.0)
Monocytes Relative: 13 %
Neutro Abs: 3.2 10*3/uL (ref 1.7–7.7)
Neutrophils Relative %: 54 %
Platelet Count: 117 10*3/uL — ABNORMAL LOW (ref 150–400)
RBC: 3.36 MIL/uL — ABNORMAL LOW (ref 4.22–5.81)
RDW: 15.6 % — ABNORMAL HIGH (ref 11.5–15.5)
WBC Count: 5.9 10*3/uL (ref 4.0–10.5)
nRBC: 0 % (ref 0.0–0.2)

## 2020-12-06 MED ORDER — ZOLEDRONIC ACID 4 MG/100ML IV SOLN
INTRAVENOUS | Status: AC
  Filled 2020-12-06: qty 100

## 2020-12-06 MED ORDER — SODIUM CHLORIDE 0.9 % IV SOLN
Freq: Once | INTRAVENOUS | Status: AC
  Filled 2020-12-06: qty 250

## 2020-12-06 MED ORDER — ZOLEDRONIC ACID 4 MG/100ML IV SOLN
4.0000 mg | Freq: Once | INTRAVENOUS | Status: AC
  Administered 2020-12-06: 4 mg via INTRAVENOUS

## 2020-12-06 MED ORDER — PROCHLORPERAZINE MALEATE 10 MG PO TABS: 10.0000 mg | ORAL_TABLET | Freq: Once | ORAL | Status: DC

## 2020-12-06 MED ORDER — BORTEZOMIB CHEMO SQ INJECTION 3.5 MG (2.5MG/ML)
1.3000 mg/m2 | Freq: Once | INTRAMUSCULAR | Status: AC
  Administered 2020-12-06: 2.5 mg via SUBCUTANEOUS
  Filled 2020-12-06: qty 1

## 2020-12-06 NOTE — Progress Notes (Signed)
I connected by phone with Andre [name] on 12/06/2020, 3:06 PM to discuss the potential use of a new treatment, tixagevimab/cilgavimab, for pre-exposure prophylaxis for prevention of coronavirus disease 2019 (COVID-19) caused by the SARS-CoV-2 virus.  This patient is a 70 y.o. male that meets the FDA criteria for Emergency Use Authorization of tixagevimab/cilgavimab for pre-exposure prophylaxis of COVID-19 disease. Pt meets following criteria:  Age >12 yr and weight > 40kg  Not currently infected with SARS-CoV-2 and has no known recent exposure to an individual infected with SARS-CoV-2 AND o Who has moderate to severe immune compromise due to a medical condition or receipt of immunosuppressive medications or treatments and may not mount an adequate immune response to COVID-19 vaccination or  o Vaccination with any available COVID-19 vaccine, according to the approved or authorized schedule, is not recommended due to a history of severe adverse reaction (e.g., severe allergic reaction) to a COVID-19 vaccine(s) and/or COVID-19 vaccine component(s).  o Patient meets the following definition of mod-severe immune compromised status: 4. Lung transplants, other solid organ transplant recipients on continual belatacept therapy, or multiple myeloma (actively receiving treatment)  I have spoken and communicated the following to the patient or parent/caregiver regarding COVID monoclonal antibody treatment:  1. FDA has authorized the emergency use of tixagevimab/cilgavimab for the pre-exposure prophylaxis of COVID-19 in patients with moderate-severe immunocompromised status, who meet above EUA criteria.  2. The significant known and potential risks and benefits of COVID monoclonal antibody, and the extent to which such potential risks and benefits are unknown.  3. Information on available alternative treatments and the risks and benefits of those alternatives, including clinical trials.  4. The patient or  parent/caregiver has the option to accept or refuse COVID monoclonal antibody treatment.  After reviewing this information with the patient, agree to receive tixagevimab/cilgavimab.  He will receive this at his next Bortezomib appointment.    Scot Dock, NP, 12/06/2020, 3:06 PM

## 2020-12-06 NOTE — Patient Instructions (Signed)
[facility] Discharge Instructions for Patients Receiving Chemotherapy  Today you received the following chemotherapy agent: Bortezomib (Velcade).  To help prevent nausea and vomiting after your treatment, we encourage you to take your nausea medication as directed by your MD.   If you develop nausea and vomiting that is not controlled by your nausea medication, call the clinic.   BELOW ARE SYMPTOMS THAT SHOULD BE REPORTED IMMEDIATELY:  *FEVER GREATER THAN 100.5 F  *CHILLS WITH OR WITHOUT FEVER  NAUSEA AND VOMITING THAT IS NOT CONTROLLED WITH YOUR NAUSEA MEDICATION  *UNUSUAL SHORTNESS OF BREATH  *UNUSUAL BRUISING OR BLEEDING  TENDERNESS IN MOUTH AND THROAT WITH OR WITHOUT PRESENCE OF ULCERS  *URINARY PROBLEMS  *BOWEL PROBLEMS  UNUSUAL RASH Items with * indicate a potential emergency and should be followed up as soon as possible.  Feel free to call the clinic should you have any questions or concerns. The clinic phone number is [phone].  Please show the [facility] at check-in to the Emergency Department and triage nurse.  Zoledronic Acid Injection (Hypercalcemia, Oncology) What is this medicine? ZOLEDRONIC ACID (ZOE le dron ik AS id) slows calcium loss from bones. It high calcium levels in the blood from some kinds of cancer. It may be used in other people at risk for bone loss. This medicine may be used for other purposes; ask your health care provider or pharmacist if you have questions. COMMON BRAND NAME(S): Zometa What should I tell my health care provider before I take this medicine? They need to know if you have any of these conditions:  cancer  dehydration  dental disease  kidney disease  liver disease  low levels of calcium in the blood  lung or breathing disease (asthma)  receiving steroids like dexamethasone or prednisone  an unusual or allergic reaction to zoledronic acid, other medicines, foods, dyes, or  preservatives  pregnant or trying to get pregnant  breast-feeding How should I use this medicine? This drug is injected into a vein. It is given by a health care provider in a hospital or clinic setting. Talk to your health care provider about the use of this drug in children. Special care may be needed. Overdosage: If you think you have taken too much of this medicine contact a poison control center or emergency room at once. NOTE: This medicine is only for you. Do not share this medicine with others. What if I miss a dose? Keep appointments for follow-up doses. It is important not to miss your dose. Call your health care provider if you are unable to keep an appointment. What may interact with this medicine?  certain antibiotics given by injection  NSAIDs, medicines for pain and inflammation, like ibuprofen or naproxen  some diuretics like bumetanide, furosemide  teriparatide  thalidomide This list may not describe all possible interactions. Give your health care provider a list of all the medicines, herbs, non-prescription drugs, or dietary supplements you use. Also tell them if you smoke, drink alcohol, or use illegal drugs. Some items may interact with your medicine. What should I watch for while using this medicine? Visit your health care provider for regular checks on your progress. It may be some time before you see the benefit from this drug. Some people who take this drug have severe bone, joint, or muscle pain. This drug may also increase your risk for jaw problems or a broken thigh bone. Tell your health care provider right away if you have severe pain in  your jaw, bones, joints, or muscles. Tell you health care provider if you have any pain that does not go away or that gets worse. Tell your dentist and dental surgeon that you are taking this drug. You should not have major dental surgery while on this drug. See your dentist to have a dental exam and fix any dental problems  before starting this drug. Take good care of your teeth while on this drug. Make sure you see your dentist for regular follow-up appointments. You should make sure you get enough calcium and vitamin D while you are taking this drug. Discuss the foods you eat and the vitamins you take with your health care provider. Check with your health care provider if you have severe diarrhea, nausea, and vomiting, or if you sweat a lot. The loss of too much body fluid may make it dangerous for you to take this drug. You may need blood work done while you are taking this drug. Do not become pregnant while taking this drug. Women should inform their health care provider if they wish to become pregnant or think they might be pregnant. There is potential for serious harm to an unborn child. Talk to your health care provider for more information. What side effects may I notice from receiving this medicine? Side effects that you should report to your doctor or health care provider as soon as possible:  allergic reactions (skin rash, itching or hives; swelling of the face, lips, or tongue)  bone pain  infection (fever, chills, cough, sore throat, pain or trouble passing urine)  jaw pain, especially after dental work  joint pain  kidney injury (trouble passing urine or change in the amount of urine)  low blood pressure (dizziness; feeling faint or lightheaded, falls; unusually weak or tired)  low calcium levels (fast heartbeat; muscle cramps or pain; pain, tingling, or numbness in the hands or feet; seizures)  low magnesium levels (fast, irregular heartbeat; muscle cramp or pain; muscle weakness; tremors; seizures)  low red blood cell counts (trouble breathing; feeling faint; lightheaded, falls; unusually weak or tired)  muscle pain  redness, blistering, peeling, or loosening of the skin, including inside the mouth  severe diarrhea  swelling of the ankles, feet, hands  trouble breathing Side effects  that usually do not require medical attention (report to your doctor or health care provider if they continue or are bothersome):  anxious  constipation  coughing  depressed mood  eye irritation, itching, or pain  fever  general ill feeling or flu-like symptoms  nausea  pain, redness, or irritation at site where injected  trouble sleeping This list may not describe all possible side effects. Call your doctor for medical advice about side effects. You may report side effects to FDA at 1-800-FDA-1088. Where should I keep my medicine? This drug is given in a hospital or clinic. It will not be stored at home. NOTE: This sheet is a summary. It may not cover all possible information. If you have questions about this medicine, talk to your doctor, pharmacist, or health care provider.  2021 [employer] (2019-06-19 09:13:00)

## 2020-12-07 ENCOUNTER — Telehealth: Payer: Self-pay | Admitting: Oncology

## 2020-12-07 LAB — KAPPA/LAMBDA LIGHT CHAINS
Kappa free light chain: 28.1 mg/L — ABNORMAL HIGH (ref 3.3–19.4)
Kappa, lambda light chain ratio: 0.5 (ref 0.26–1.65)
Lambda free light chains: 56.2 mg/L — ABNORMAL HIGH (ref 5.7–26.3)

## 2020-12-07 LAB — IGG: IgG (Immunoglobin G), Serum: 1339 mg/dL (ref 603–1613)

## 2020-12-07 NOTE — Telephone Encounter (Signed)
Scheduled appts per 3/21 los. Called pt, no answer. Left msg with appt date and time.

## 2020-12-08 LAB — PROTEIN ELECTROPHORESIS, SERUM
A/G Ratio: 1.1 (ref 0.7–1.7)
Albumin ELP: 3.7 g/dL (ref 2.9–4.4)
Alpha-1-Globulin: 0.2 g/dL (ref 0.0–0.4)
Alpha-2-Globulin: 0.8 g/dL (ref 0.4–1.0)
Beta Globulin: 1 g/dL (ref 0.7–1.3)
Gamma Globulin: 1.4 g/dL (ref 0.4–1.8)
Globulin, Total: 3.4 g/dL (ref 2.2–3.9)
M-Spike, %: 0.8 g/dL — ABNORMAL HIGH
Total Protein ELP: 7.1 g/dL (ref 6.0–8.5)

## 2020-12-09 ENCOUNTER — Encounter: Payer: Self-pay | Admitting: Oncology

## 2020-12-13 ENCOUNTER — Other Ambulatory Visit: Payer: Self-pay | Admitting: Oncology

## 2020-12-14 ENCOUNTER — Other Ambulatory Visit: Payer: Self-pay | Admitting: Oncology

## 2020-12-14 DIAGNOSIS — Z7189 Other specified counseling: Secondary | ICD-10-CM

## 2020-12-14 DIAGNOSIS — T451X5A Adverse effect of antineoplastic and immunosuppressive drugs, initial encounter: Secondary | ICD-10-CM

## 2020-12-14 DIAGNOSIS — C9000 Multiple myeloma not having achieved remission: Secondary | ICD-10-CM

## 2020-12-14 DIAGNOSIS — Z9481 Bone marrow transplant status: Secondary | ICD-10-CM

## 2020-12-14 DIAGNOSIS — G620 Drug-induced polyneuropathy: Secondary | ICD-10-CM

## 2020-12-14 MED ORDER — DEXAMETHASONE 4 MG PO TABS: ORAL_TABLET | ORAL | 7 refills | Status: DC

## 2020-12-14 MED ORDER — PROCHLORPERAZINE MALEATE 10 MG PO TABS
10.0000 mg | ORAL_TABLET | Freq: Four times a day (QID) | ORAL | 1 refills | Status: DC | PRN
  Filled 2021-05-16: qty 30, 8d supply, fill #0

## 2020-12-14 MED ORDER — ACYCLOVIR 400 MG PO TABS: 400.0000 mg | ORAL_TABLET | Freq: Two times a day (BID) | ORAL | 5 refills | Status: DC

## 2020-12-14 MED ORDER — DEXAMETHASONE 4 MG PO TABS: ORAL_TABLET | ORAL | 1 refills | Status: DC

## 2020-12-14 NOTE — Progress Notes (Signed)
Andre [name]'s myeloma is progressing on Velcade.  I contacted Dr. Alvie Heidelberg at [facility] and she is going to try to enroll him on there CAR T-cells program.  In the meantime we are going to add daratumumab subcu and dexamethasone to his program here.  I have written the orders and left a message with [name].

## 2020-12-14 NOTE — Progress Notes (Signed)
DISCONTINUE OFF PATHWAY REGIMEN - Multiple Myeloma and Other Plasma Cell Dyscrasias   [id number]:Bortezomib:   A cycle is every 21 days:     Bortezomib   **Always confirm dose/schedule in your pharmacy ordering system**  REASON: Disease Progression PRIOR TREATMENT: Off Pathway: Bortezomib TREATMENT RESPONSE: Progressive Disease (PD)  START ON PATHWAY REGIMEN - Multiple Myeloma and Other Plasma Cell Dyscrasias     Cycles 1 through 3: A cycle is every 21 days:     Dexamethasone      Bortezomib      Daratumumab    Cycles 4 through 8: A cycle is every 21 days:     Dexamethasone      Bortezomib      Daratumumab    Cycles 9 and beyond: A cycle is every 28 days:     Daratumumab   **Always confirm dose/schedule in your pharmacy ordering system**  Patient Characteristics: Multiple Myeloma, Relapsed / Refractory, Second through Fourth Lines of Therapy, Fit or Candidate for Triplet Therapy, Lenalidomide-Refractory or Lenalidomide-based Regimen Not Preferred, Candidate for Anti-CD38 Antibody Disease Classification: Multiple Myeloma R-ISS Staging: III Therapeutic Status: Relapsed Line of Therapy: Third Line Anti-CD38 Antibody Candidacy: Candidate for Anti-CD38 Antibody Lenalidomide-based Regimen Preference/Candidacy: Lenalidomide-based Regimen Not Preferred Intent of Therapy: Non-Curative / Palliative Intent, Discussed with Patient

## 2020-12-15 ENCOUNTER — Telehealth: Payer: Self-pay

## 2020-12-15 ENCOUNTER — Encounter: Payer: Self-pay | Admitting: Oncology

## 2020-12-15 ENCOUNTER — Telehealth: Payer: Self-pay | Admitting: Oncology

## 2020-12-15 ENCOUNTER — Other Ambulatory Visit: Payer: Self-pay | Admitting: Pharmacist

## 2020-12-15 DIAGNOSIS — C9000 Multiple myeloma not having achieved remission: Secondary | ICD-10-CM

## 2020-12-15 DIAGNOSIS — Z9481 Bone marrow transplant status: Secondary | ICD-10-CM

## 2020-12-15 DIAGNOSIS — T451X5A Adverse effect of antineoplastic and immunosuppressive drugs, initial encounter: Secondary | ICD-10-CM

## 2020-12-15 DIAGNOSIS — G620 Drug-induced polyneuropathy: Secondary | ICD-10-CM

## 2020-12-15 NOTE — Telephone Encounter (Signed)
Pt called back and LVM. This LPN attempted to return pt's call once again, LVM.

## 2020-12-15 NOTE — Telephone Encounter (Signed)
R/s appts per 3/30 sch msg. Called pt, no answer. Left msg with appts dates and times.

## 2020-12-19 NOTE — Progress Notes (Signed)
[place]  [phone] [phone]    ID: Andre [name]  DOB: 1951/06/08  MR#: [id number] CSN#: [id number]   Patient Care Team: Cassandria Anger, MD as PCP - General (Internal Medicine) Magrinat, [name], MD as Consulting Physician (Oncology) Concepcion Living, MD as Consulting Physician (Neurology) [name], MD as Consulting Physician (Internal Medicine) Kathrynn Ducking, MD as Consulting Physician (Neurology) Dorna Bloom, DDS as Referring Physician (Oral Surgery) OTHERS: Augustina Mood DDS   CHIEF COMPLAINT: Multiple myeloma  CURRENT TREATMENT: bortezomib/daratumumab/dexamethasone   INTERVAL HISTORY:  Andre [name] returns today for follow-up and treatment of his multiple myeloma.  He is accompanied by his wife  We noted evidence of disease progression on his myeloma panel.  We contacted Dr. Alvie Heidelberg at [facility] and my understanding is that she plans to transition him over to car T-cell treatment once he is back in remission.  To get him into remission we are starting him on bortezomib, daratumumab, dexamethasone.  He has a very good understanding of the possible toxicities side effects and complications and received the first dose today.  He also continues on zolendronate, which  he eceived today 12/06/2020.  He tolerates this well except it makes him a bit confused for a few days, possibly due to calcium fluxes.  Lab Results  Component Value Date   TOTALPROTELP 7.1 12/06/2020   ALBUMINELP 3.7 12/06/2020   A1GS 0.2 12/06/2020   A2GS 0.8 12/06/2020   BETS 1.0 12/06/2020   BETA2SER 0.4 05/11/2015   GAMS 1.4 12/06/2020   MSPIKE 0.8 (H) 12/06/2020   SPEI Comment 12/06/2020    Lab Results  Component Value Date   KPAFRELGTCHN 28.1 (H) 12/06/2020   LAMBDASER 56.2 (H) 12/06/2020   KAPLAMBRATIO 0.50 12/06/2020     REVIEW OF SYSTEMS: Andre [name] is doing fine.  He has no unusual headaches, visual changes, nausea, vomiting,  mouth sores, rash, fever, unexplained fatigue or unexplained weight loss.  He has no bone pain.  He has had no change in bowel or bladder habits.  Detailed review of systems today was otherwise stable.   COVID 19 VACCINATION STATUS: [place] x2, most recently 11/2019   HISTORY OF PRESENT ILLNESS:  From the original intake nodes:  Andre [name] was worked up for peptic ulcer disease in August of 2011, with significant bleeding and anemia.  The patient was Helicobacter pylori negative.  He received some epinephrine when he had his EGD and then started on Protonix.  [name] anemia slowly resolved so that by September, his hemoglobin was up to 10.9 and by earlier this month, his hemoglobin was up to 12.5.    As part of his general workup, he was found to have a slightly elevated globulin fraction.  In September, his total protein was 8.3 with an albumin of 3.8.  In January, the total protein was 8.4 and albumin 3.6.  With persistence of this slight abnormality, Dr. Olevia Perches obtained serum immunofixation and SPEP.  The SPTP showed an M-spike of 2.67 grams.  A total IgG was 4,190.  Total IgA low at 28.  Total IgM low at 28 also.  The immunofixation showed a monoclonal IgG lambda paraprotein.  There were also monoclonal free lambda light chains present. With this information, the patient was referred for further evaluation.   A diagnosis of myeloma was confirmed by bone marrow biopsy and subsequebnt treatment is as detailed below   PAST MEDICAL HISTORY: Past Medical History:  Diagnosis Date  . Allergy   .  Anemia   . Arthritis   . Asthma    no treatment x 20 years  . Depression   . Double vision    occurs at times   . Duodenal ulcer   . GERD (gastroesophageal reflux disease)   . Hyperlipidemia   . Hypertension   . Hypothyroidism   . Multiple myeloma 07/04/2011  . Thyroid disease   Significant for peptic ulcer disease as noted above. History of anxiety and depression.  History of GERD  which is significantly improved with weight loss and history of reactive airway disease, possibly secondary to the GERD which also improved with weight loss.  There was a history of obesity, now much improved secondary to exercise and diet.    PAST SURGICAL HISTORY: Past Surgical History:  Procedure Laterality Date  . BONE MARROW TRANSPLANT  2011   for MM  . CARDIOLITE STUDY  11/25/2003   NORMAL  . TONSILLECTOMY      FAMILY HISTORY: Family History  Problem Relation Age of Onset  . Throat cancer Mother   . Hypertension Father   . Stroke Father   . Asthma Father   . Diabetes Father   . Pancreatic cancer Brother   [name] father died at the age of 30 following a stroke.  [name] mother died with cancer of the throat at the age of 73.   She had a history of depression and was a smoker.  Not clear how much alcohol she drank, according to the patient.  The patient had two brothers.  One died from the age of 62 from a "kidney problem."  The second one died at the age of 16 from pancreatic cancer. (This may have been duodenal cancer. 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  His wife of 37+ years was Andre [name], but they divorced. In 2016 he remarried, his current wife's name is Andre [name].  The patient has two daughters:  Andre [name], who works in [place], who had her second child April 2016. Both daughters live here in [place].  [place] first daughter, Andre [name], born 03/06/2013, apparently has Weber/Osler/Rendu. The patient is not a church attender.  Andre [name] also participates in [place] even though actually there is no alcohol or drug problem in the family.     ADVANCED DIRECTIVES: In place   HEALTH MAINTENANCE:  Social History   Tobacco Use  . Smoking status: Former Smoker    Years: 3.00     Quit date: 03/27/1969    Years since quitting: 51.7  . Smokeless tobacco: Never Used  Substance Use Topics  . Alcohol use: No  . Drug use: No    Colonoscopy: January 2015  PSA: December 2018, normal/Dr. [name]  Lipid panel: per Dr. Alain Marion  Allergies  Allergen Reactions  . Atorvastatin Other (See Comments)  . Rosuvastatin     Other reaction(s): Liver Disorder  . Crestor [Rosuvastatin Calcium]     ADVERSE EFFECTS ON LIVER  . Lipitor [Atorvastatin Calcium]   . Septra [Sulfamethoxazole-Trimethoprim] Rash   Current Outpatient Medications  Medication Sig Dispense Refill  . LORazepam (ATIVAN) 0.5 MG tablet Take 1 tablet (0.5 mg total) by mouth at bedtime as needed for anxiety. 20 tablet 0  . acyclovir (ZOVIRAX) 400 MG tablet Take 1 tablet (400 mg total) by mouth 2 (two)  times daily. 60 tablet 5  . calcium-vitamin D (OSCAL WITH D) 500-200 MG-UNIT tablet Take 1 tablet by mouth daily.    . cetirizine (ZYRTEC) 10 MG tablet Take 10 mg by mouth daily.    . Cholecalciferol (VITAMIN D3) 2000 units capsule Take 1 capsule (2,000 Units total) by mouth daily. 100 capsule 3  . dexamethasone (DECADRON) 4 MG tablet Take 5 tablets (20 mg) with breakfast the morning after chemotherapy 60 tablet 1  . dexamethasone (DECADRON) 4 MG tablet Take 5 tablets (20 mg) the day after velcade. Repeat every 21d for 8 cycles. 20 tablet 7  . docusate sodium (COLACE) 100 MG capsule Take 100 mg by mouth daily as needed for mild constipation or moderate constipation.    [name] escitalopram (LEXAPRO) 10 MG tablet Take 1 tablet (10 mg total) by mouth daily. 90 tablet 3  . HYDROCORTISONE ACE, RECTAL, 30 MG SUPP 1 pr bid x 7 d prn 14 suppository 3  . levothyroxine (SYNTHROID) 150 MCG tablet TAKE 1 TABLET BY MOUTH EVERY MORNING ON AN EMPTY STOMACH 90 tablet 3  . ondansetron (ZOFRAN) 8 MG tablet TAKE 1 TABLET(8 MG) BY MOUTH EVERY 8 HOURS AS NEEDED FOR NAUSEA OR VOMITING 20 tablet 3  . pantoprazole (PROTONIX) 40 MG tablet TAKE 1  TABLET BY MOUTH  TWICE A DAY BEFORE MEALS 180 tablet 3  . prochlorperazine (COMPAZINE) 10 MG tablet Take 1 tablet (10 mg total) by mouth every 6 (six) hours as needed (Nausea or vomiting). 30 tablet 1  . triamcinolone ointment (KENALOG) 0.1 % Apply 1 application topically 2 (two) times daily. 80 g 2  . valACYclovir (VALTREX) 1000 MG tablet TAKE 1 TABLET(1000 MG) BY MOUTH DAILY 90 tablet 3  . zolendronic acid (ZOMETA) 4 MG/5ML injection Inject 4 mg into the vein every 3 (three) months.     No current facility-administered medications for this visit.   Objective: white man who appears stated age There were no vitals filed for this visit. Wt Readings from Last 3 Encounters:  12/20/20 179 lb 12 oz (81.5 kg)  12/06/20 178 lb 9.6 oz (81 kg)  11/15/20 179 lb 4 oz (81.3 kg)   There is no height or weight on file to calculate BMI.    For labs associated with the 04004 2022 visit please see the infusion area flowsheet  ECOG FS:1 - Symptomatic but completely ambulatory   LAB RESULTS:  Appointment on 12/20/2020  Component Date Value Ref Range Status  . Sodium 12/20/2020 137  135 - 145 mmol/L Final  . Potassium 12/20/2020 4.5  3.5 - 5.1 mmol/L Final  . Chloride 12/20/2020 104  98 - 111 mmol/L Final  . CO2 12/20/2020 21* 22 - 32 mmol/L Final  . Glucose, Bld 12/20/2020 67* 70 - 99 mg/dL Final   Glucose reference range applies only to samples taken after fasting for at least 8 hours.  . BUN 12/20/2020 26* 8 - 23 mg/dL Final  . Creatinine 12/20/2020 1.21  0.61 - 1.24 mg/dL Final  . Calcium 12/20/2020 9.2  8.9 - 10.3 mg/dL Final  . Total Protein 12/20/2020 7.4  6.5 - 8.1 g/dL Final  . Albumin 12/20/2020 3.8  3.5 - 5.0 g/dL Final  . AST 12/20/2020 22  15 - 41 U/L Final  . ALT 12/20/2020 16  0 - 44 U/L Final  . Alkaline Phosphatase 12/20/2020 65  38 - 126 U/L Final  . Total Bilirubin 12/20/2020 0.3  0.3 - 1.2 mg/dL Final  . GFR, Estimated  12/20/2020 >60  >60 mL/min Final   Comment:  (NOTE) Calculated using the CKD-EPI Creatinine Equation (2021)   . Anion gap 12/20/2020 12  5 - 15 Final   Performed at [facility], [place] [address], [place], [place] [address]  . WBC Count 12/20/2020 4.9  4.0 - 10.5 K/uL Final  . RBC 12/20/2020 3.29* 4.22 - 5.81 MIL/uL Final  . Hemoglobin 12/20/2020 10.8* 13.0 - 17.0 g/dL Final  . HCT 12/20/2020 32.4* 39.0 - 52.0 % Final  . MCV 12/20/2020 98.5  80.0 - 100.0 fL Final  . MCH 12/20/2020 32.8  26.0 - 34.0 pg Final  . MCHC 12/20/2020 33.3  30.0 - 36.0 g/dL Final  . RDW 12/20/2020 15.7* 11.5 - 15.5 % Final  . Platelet Count 12/20/2020 106* 150 - 400 K/uL Final  . nRBC 12/20/2020 0.0  0.0 - 0.2 % Final  . Neutrophils Relative % 12/20/2020 63  % Final  . Neutro Abs 12/20/2020 3.0  1.7 - 7.7 K/uL Final  . Lymphocytes Relative 12/20/2020 19  % Final  . Lymphs Abs 12/20/2020 0.9  0.7 - 4.0 K/uL Final  . Monocytes Relative 12/20/2020 16  % Final  . Monocytes Absolute 12/20/2020 0.8  0.1 - 1.0 K/uL Final  . Eosinophils Relative 12/20/2020 2  % Final  . Eosinophils Absolute 12/20/2020 0.1  0.0 - 0.5 K/uL Final  . Basophils Relative 12/20/2020 0  % Final  . Basophils Absolute 12/20/2020 0.0  0.0 - 0.1 K/uL Final  . Immature Granulocytes 12/20/2020 0  % Final  . Abs Immature Granulocytes 12/20/2020 0.01  0.00 - 0.07 K/uL Final   Performed at [facility], [place] [address] [place], [place] [address]  . PT AG Type 12/20/2020    Final                   Value:POSITIVE FOR C ANTIGEN POSITIVE FOR c ANTIGEN NEGATIVE FOR E ANTIGEN NEGATIVE FOR KELL ANTIGEN POSITIVE FOR DUFFY A ANTIGEN POSITIVE FOR DUFFY [place] ANTIGEN POSITIVE FOR S ANTIGEN POSITIVE FOR s ANTIGEN POSITIVE FOR KIDD A ANTIGEN NEGATIVE FOR KIDD [place] ANTIGEN  POSITIVE FOR M ANTIGEN POSITIVE FOR e ANTIGEN   . DAT, IgG 12/20/2020    Final                   Value:NEG Performed at [facility], [place] [address], [place], [place]  [address]   . ABO/RH(D) 12/20/2020 A POS   Final  . Antibody Screen 12/20/2020 NEG   Final  . Sample Expiration 12/20/2020    Final                   Value:12/23/2020,2359 Performed at [facility], [place] [address], [place], [place] [address]     STUDIES: No results found.    ASSESSMENT: 70 y.o. [place] man with a history of multiple myeloma diagnosed February of 2012, with an initial M-spike of 2.6 g/dL, IFE showing an IgG lambda paraprotein and free lambda light chains in the urine. Cytogenetics showed trisomy 31. Bone marrow biopsy showed a 22% plasmacytosis. Treated with   (1) bortezomib (subcutaneously), lenalidomide, and dexamethasone, with repeat bone marrow biopsy May of 2012 showing 10% plasmacytosis   (2) high-dose chemotherapy with BCNU and melphalan at [facility], followed by stem cell rescue July of 2012   (3) on zoledronic acid started December of 2012, initially monthly, currently given every 3 months, most recent dose  12/07/2015  (4) low-dose lenalidomide resumed April 2013, interrupted several times.  Resumed again on 02/19/2013, ata dose of 5 mg daily, 21 days on and 7 days off, later further reduced to 7 days on, 7 days off  (5) CNS symptoms and abnormal brain MRI extensively evaluated by neurology with no definitive diagnosis established  (6) rising M spike noted June 2014 but did not meet criteria for carfilzomib study  (7) on lenalidomide 25 mg daily, 14 days on, 7 days off, starting 04/18/2013, interrupted December 2014 because of rash;   (a) resumed January 2015 at 10 mg/ day at 21 days on/ 7 days off  (Reade) starting 08/18/2014 decreased to 10 mg/ day 14 days on, 7 days off because of cytopenias  (c) as of February 2016 was on 5 mg daily 7 days on 7 days off  (d) lenalidomide discontinued December 2016 with evidence of disease progression  (8) transient global amnesia 05/29/2015, resolved without intervention  (9) starting PVD 10/25/2015 w ASA 325  thromboprophylaxis, valacyclovir prophylaxis, last dose 12/17/2015  (a) pomalidomide 4 mg/d days 1-14  (Darivs) bortezomib sQ days 2,5,9,12 of each 21 day cycle  (c) dexamethasome 20 mg two days a week  (d) dexamethasone bortezomib and pomalidomide discontinued late December 2018 with poor tolerance  (10) metapneumovirus pneumonia April 2017  (a) completing course of steroids and week of bactrim mid April 2017  (11) status post second autologous transplant at [facility] 02/04/2016(preparatory regimen melphalan 200 mg/m)  (a) received twelve-month vaccinations 03/14/2017 (DPT, Haemophilus, Pneumovax 13, polio)  ([place]) 14 months injections 05/04/2016 include DTaP, Hib conjugate, HepB energex Yanni 20 mcg/ml, Prevnar 13  (c) 24 month vaccines due at [facility] June 2019  (12) maintenance therapy started November 2017, consisting of  (a) bortezomib 1.3 mg/M2 every 14 days, first dose 07/27/2016  ([place]) pomalidomide 1 mg days 1-21 Q28 days, started 07/19/2016  (c) zolendronate monthly started 07/27/2016 (previously Q 3 months) however patient unable to tolerate, and changed back to q3 months in April, 2018   (d) Bortezomib changed to monthly as of June 2018 because of tolerance issues, however discontinued after 09/14/2017 dose because of a rise in his M spike  (e) pomalidomide held after 10/18/2017 in preparation for possible study at [facility] (venetoclax)--never resumed  (f) with numbers actually improved off treatment, resumed every 2-week bortezomib 12/25/2017  (g) changed to every 3-week bortezomib as of 03/17/2019  (h) briefly on weekly treatments times 22 October 2020 due to increase in M spike  (I) maintenance therapy discontinued with evidence of progression  (13) bortezomib, daratumumab, dexamethasone started 12/20/2020.   PLAN:  Andre [name] is a little over 10 years out from initial diagnosis of multiple myeloma and just about 10 years out from his first autologous transplant.  He was on maintenance bortezomib for  quite a long time.  We increased the dosing interval and he actually missed some treatments as well because of vacation trips.  We saw some evidence of progression and went back to weekly treatments but he still showed an increase in his M spike so we are going back to remission induction therapy.  He received his first dose of dexamethasone, bortezomib and daratumumab today.  He tolerated it remarkably well.  He will have 20 mg of dexamethasone on day 2 and he is aware of how to take that.  I have added lorazepam in case he has trouble sleeping from the steroids.  I have asked him to call us with any side effects that  he develops so that we can troubleshoot it immediately without having to wait for him to return a week for his next treatment.  He will receive the daratumumab weekly initially and the bortezomib weeks 1 and 2 of each 3-week cycle.  Of course he will take the dexamethasone with the daratumumab, 20 mg on the day of treatment and 28 mg the next day  I have added a visit with me next week just to make sure everything is going well.  He has an appointment at [facility] on March 10, 2021, but Dr. Alvie Heidelberg may choose to see him before that as needed  Total encounter time 35 minutes.* Magrinat, [name], MD  12/20/20 3:11 PM Medical Oncology and Hematology [facility] [place], [place] [address] Tel. [phone]    Fax. [phone]    I, Wilburn Mylar, am acting as scribe for Dr. [name]. Magrinat.  I, Lurline Del MD, have reviewed the above documentation for accuracy and completeness, and I agree with the above.   *Total Encounter Time as defined by the Centers for Medicare and Medicaid Services includes, in addition to the face-to-face time of a patient visit (documented in the note above) non-face-to-face time: obtaining and reviewing outside history, ordering and reviewing medications, tests or procedures, care coordination (communications with  other health care professionals or caregivers) and documentation in the medical record.

## 2020-12-20 ENCOUNTER — Ambulatory Visit: Payer: Medicare Other

## 2020-12-20 ENCOUNTER — Inpatient Hospital Stay: Payer: Medicare Other

## 2020-12-20 ENCOUNTER — Other Ambulatory Visit: Payer: Medicare Other

## 2020-12-20 ENCOUNTER — Other Ambulatory Visit: Payer: Self-pay | Admitting: Pharmacist

## 2020-12-20 ENCOUNTER — Other Ambulatory Visit: Payer: Self-pay | Admitting: Oncology

## 2020-12-20 ENCOUNTER — Inpatient Hospital Stay: Payer: Medicare Other | Attending: Oncology | Admitting: Oncology

## 2020-12-20 ENCOUNTER — Other Ambulatory Visit: Payer: Self-pay

## 2020-12-20 VITALS — BP 128/63 | HR 63 | Temp 97.6°F | Resp 18 | Wt 179.8 lb

## 2020-12-20 DIAGNOSIS — Z9481 Bone marrow transplant status: Secondary | ICD-10-CM

## 2020-12-20 DIAGNOSIS — Z79899 Other long term (current) drug therapy: Secondary | ICD-10-CM | POA: Insufficient documentation

## 2020-12-20 DIAGNOSIS — T451X5A Adverse effect of antineoplastic and immunosuppressive drugs, initial encounter: Secondary | ICD-10-CM

## 2020-12-20 DIAGNOSIS — C9000 Multiple myeloma not having achieved remission: Secondary | ICD-10-CM

## 2020-12-20 DIAGNOSIS — Z5112 Encounter for antineoplastic immunotherapy: Secondary | ICD-10-CM | POA: Diagnosis not present

## 2020-12-20 DIAGNOSIS — G620 Drug-induced polyneuropathy: Secondary | ICD-10-CM

## 2020-12-20 DIAGNOSIS — R9089 Other abnormal findings on diagnostic imaging of central nervous system: Secondary | ICD-10-CM

## 2020-12-20 LAB — CBC WITH DIFFERENTIAL (CANCER CENTER ONLY)
Abs Immature Granulocytes: 0.01 10*3/uL (ref 0.00–0.07)
Basophils Absolute: 0 10*3/uL (ref 0.0–0.1)
Basophils Relative: 0 %
Eosinophils Absolute: 0.1 10*3/uL (ref 0.0–0.5)
Eosinophils Relative: 2 %
HCT: 32.4 % — ABNORMAL LOW (ref 39.0–52.0)
Hemoglobin: 10.8 g/dL — ABNORMAL LOW (ref 13.0–17.0)
Immature Granulocytes: 0 %
Lymphocytes Relative: 19 %
Lymphs Abs: 0.9 10*3/uL (ref 0.7–4.0)
MCH: 32.8 pg (ref 26.0–34.0)
MCHC: 33.3 g/dL (ref 30.0–36.0)
MCV: 98.5 fL (ref 80.0–100.0)
Monocytes Absolute: 0.8 10*3/uL (ref 0.1–1.0)
Monocytes Relative: 16 %
Neutro Abs: 3 10*3/uL (ref 1.7–7.7)
Neutrophils Relative %: 63 %
Platelet Count: 106 10*3/uL — ABNORMAL LOW (ref 150–400)
RBC: 3.29 MIL/uL — ABNORMAL LOW (ref 4.22–5.81)
RDW: 15.7 % — ABNORMAL HIGH (ref 11.5–15.5)
WBC Count: 4.9 10*3/uL (ref 4.0–10.5)
nRBC: 0 % (ref 0.0–0.2)

## 2020-12-20 LAB — CMP (CANCER CENTER ONLY)
ALT: 16 U/L (ref 0–44)
AST: 22 U/L (ref 15–41)
Albumin: 3.8 g/dL (ref 3.5–5.0)
Alkaline Phosphatase: 65 U/L (ref 38–126)
Anion gap: 12 (ref 5–15)
BUN: 26 mg/dL — ABNORMAL HIGH (ref 8–23)
CO2: 21 mmol/L — ABNORMAL LOW (ref 22–32)
Calcium: 9.2 mg/dL (ref 8.9–10.3)
Chloride: 104 mmol/L (ref 98–111)
Creatinine: 1.21 mg/dL (ref 0.61–1.24)
GFR, Estimated: >60 mL/min (ref >60)
Glucose, Bld: 67 mg/dL — ABNORMAL LOW (ref 70–99)
Potassium: 4.5 mmol/L (ref 3.5–5.1)
Sodium: 137 mmol/L (ref 135–145)
Total Bilirubin: 0.3 mg/dL (ref 0.3–1.2)
Total Protein: 7.4 g/dL (ref 6.5–8.1)

## 2020-12-20 LAB — PRETREATMENT RBC PHENOTYPE: DAT, IgG: NEGATIVE

## 2020-12-20 LAB — TYPE AND SCREEN
ABO/RH(D): A POS
Antibody Screen: NEGATIVE

## 2020-12-20 MED ORDER — MONTELUKAST SODIUM 10 MG PO TABS
10.0000 mg | ORAL_TABLET | Freq: Once | ORAL | Status: AC
Start: 2020-12-20 — End: 2020-12-20
  Administered 2020-12-20: 10 mg via ORAL

## 2020-12-20 MED ORDER — DIPHENHYDRAMINE HCL 25 MG PO CAPS
ORAL_CAPSULE | ORAL | Status: AC
  Filled 2020-12-20: qty 2

## 2020-12-20 MED ORDER — BORTEZOMIB CHEMO SQ INJECTION 3.5 MG (2.5MG/ML)
1.3000 mg/m2 | Freq: Once | INTRAMUSCULAR | Status: AC
  Administered 2020-12-20: 2.5 mg via SUBCUTANEOUS
  Filled 2020-12-20: qty 1

## 2020-12-20 MED ORDER — ACETAMINOPHEN 325 MG PO TABS
ORAL_TABLET | ORAL | Status: AC
  Filled 2020-12-20: qty 2

## 2020-12-20 MED ORDER — ACETAMINOPHEN 325 MG PO TABS
650.0000 mg | ORAL_TABLET | Freq: Once | ORAL | Status: AC
  Administered 2020-12-20: 650 mg via ORAL

## 2020-12-20 MED ORDER — DEXAMETHASONE 4 MG PO TABS
20.0000 mg | ORAL_TABLET | Freq: Once | ORAL | Status: AC
  Administered 2020-12-20: 20 mg via ORAL

## 2020-12-20 MED ORDER — DIPHENHYDRAMINE HCL 25 MG PO CAPS
25.0000 mg | ORAL_CAPSULE | Freq: Once | ORAL | Status: AC
  Administered 2020-12-20: 25 mg via ORAL

## 2020-12-20 MED ORDER — LORAZEPAM 0.5 MG PO TABS: 0.5000 mg | ORAL_TABLET | Freq: Every evening | ORAL | 0 refills | Status: DC | PRN

## 2020-12-20 MED ORDER — DEXAMETHASONE 4 MG PO TABS
ORAL_TABLET | ORAL | Status: AC
  Filled 2020-12-20: qty 10

## 2020-12-20 MED ORDER — DARATUMUMAB-HYALURONIDASE-FIHJ 1800-30000 MG-UT/15ML ~~LOC~~ SOLN
1800.0000 mg | Freq: Once | SUBCUTANEOUS | Status: AC
  Administered 2020-12-20: 1800 mg via SUBCUTANEOUS
  Filled 2020-12-20: qty 15

## 2020-12-20 MED ORDER — MONTELUKAST SODIUM 10 MG PO TABS
ORAL_TABLET | ORAL | Status: AC
  Filled 2020-12-20: qty 1

## 2020-12-20 NOTE — Patient Instructions (Signed)
[place] Discharge Instructions for Patients Receiving Chemotherapy  Today you received the following chemotherapy agents darzalex, velcade  To help prevent nausea and vomiting after your treatment, we encourage you to take your nausea medication as directed.   If you develop nausea and vomiting that is not controlled by your nausea medication, call the clinic.   BELOW ARE SYMPTOMS THAT SHOULD BE REPORTED IMMEDIATELY:  *FEVER GREATER THAN 100.5 F  *CHILLS WITH OR WITHOUT FEVER  NAUSEA AND VOMITING THAT IS NOT CONTROLLED WITH YOUR NAUSEA MEDICATION  *UNUSUAL SHORTNESS OF BREATH  *UNUSUAL BRUISING OR BLEEDING  TENDERNESS IN MOUTH AND THROAT WITH OR WITHOUT PRESENCE OF ULCERS  *URINARY PROBLEMS  *BOWEL PROBLEMS  UNUSUAL RASH Items with * indicate a potential emergency and should be followed up as soon as possible.  Feel free to call the clinic should you have any questions or concerns. The clinic phone number is [phone].  Please show the [place] at check-in to the Emergency Department and triage nurse.

## 2020-12-21 LAB — IGG: IgG (Immunoglobin G), Serum: 1382 mg/dL (ref 603–1613)

## 2020-12-22 ENCOUNTER — Telehealth: Payer: Self-pay | Admitting: Oncology

## 2020-12-22 NOTE — Telephone Encounter (Signed)
Scheduled appts per 4/4 los. Called pt, no answer. Left msg with appts dates and times as well as locations.

## 2020-12-27 ENCOUNTER — Other Ambulatory Visit: Payer: Self-pay

## 2020-12-27 ENCOUNTER — Inpatient Hospital Stay: Payer: Medicare Other

## 2020-12-27 VITALS — BP 148/56 | HR 59 | Resp 17

## 2020-12-27 DIAGNOSIS — Z5112 Encounter for antineoplastic immunotherapy: Secondary | ICD-10-CM | POA: Diagnosis not present

## 2020-12-27 DIAGNOSIS — G620 Drug-induced polyneuropathy: Secondary | ICD-10-CM

## 2020-12-27 DIAGNOSIS — Z9481 Bone marrow transplant status: Secondary | ICD-10-CM

## 2020-12-27 DIAGNOSIS — T451X5A Adverse effect of antineoplastic and immunosuppressive drugs, initial encounter: Secondary | ICD-10-CM

## 2020-12-27 DIAGNOSIS — C9000 Multiple myeloma not having achieved remission: Secondary | ICD-10-CM

## 2020-12-27 MED ORDER — BORTEZOMIB CHEMO SQ INJECTION 3.5 MG (2.5MG/ML)
1.3000 mg/m2 | Freq: Once | INTRAMUSCULAR | Status: AC
Start: 2020-12-27 — End: 2020-12-27
  Administered 2020-12-27: 2.5 mg via SUBCUTANEOUS
  Filled 2020-12-27: qty 1

## 2020-12-27 MED ORDER — DIPHENHYDRAMINE HCL 25 MG PO CAPS
ORAL_CAPSULE | ORAL | Status: AC
  Filled 2020-12-27: qty 1

## 2020-12-27 MED ORDER — ACETAMINOPHEN 325 MG PO TABS
650.0000 mg | ORAL_TABLET | Freq: Once | ORAL | Status: AC
Start: 2020-12-27 — End: 2020-12-27
  Administered 2020-12-27: 650 mg via ORAL

## 2020-12-27 MED ORDER — DEXAMETHASONE 4 MG PO TABS
20.0000 mg | ORAL_TABLET | Freq: Once | ORAL | Status: AC
  Administered 2020-12-27: 20 mg via ORAL

## 2020-12-27 MED ORDER — ACETAMINOPHEN 325 MG PO TABS
ORAL_TABLET | ORAL | Status: AC
  Filled 2020-12-27: qty 2

## 2020-12-27 MED ORDER — DEXAMETHASONE 4 MG PO TABS
ORAL_TABLET | ORAL | Status: AC
  Filled 2020-12-27: qty 5

## 2020-12-27 MED ORDER — MONTELUKAST SODIUM 10 MG PO TABS
10.0000 mg | ORAL_TABLET | Freq: Once | ORAL | Status: AC
  Administered 2020-12-27: 10 mg via ORAL
  Filled 2020-12-27: qty 1

## 2020-12-27 MED ORDER — DIPHENHYDRAMINE HCL 25 MG PO CAPS
25.0000 mg | ORAL_CAPSULE | Freq: Once | ORAL | Status: AC
  Administered 2020-12-27: 25 mg via ORAL

## 2020-12-27 MED ORDER — DARATUMUMAB-HYALURONIDASE-FIHJ 1800-30000 MG-UT/15ML ~~LOC~~ SOLN
1800.0000 mg | Freq: Once | SUBCUTANEOUS | Status: AC
  Administered 2020-12-27: 1800 mg via SUBCUTANEOUS
  Filled 2020-12-27: qty 15

## 2020-12-27 NOTE — Patient Instructions (Signed)
[place] Discharge Instructions for Patients Receiving Chemotherapy  Today you received the following chemotherapy agents darzalex, velcade  To help prevent nausea and vomiting after your treatment, we encourage you to take your nausea medication as directed.   If you develop nausea and vomiting that is not controlled by your nausea medication, call the clinic.   BELOW ARE SYMPTOMS THAT SHOULD BE REPORTED IMMEDIATELY:  *FEVER GREATER THAN 100.5 F  *CHILLS WITH OR WITHOUT FEVER  NAUSEA AND VOMITING THAT IS NOT CONTROLLED WITH YOUR NAUSEA MEDICATION  *UNUSUAL SHORTNESS OF BREATH  *UNUSUAL BRUISING OR BLEEDING  TENDERNESS IN MOUTH AND THROAT WITH OR WITHOUT PRESENCE OF ULCERS  *URINARY PROBLEMS  *BOWEL PROBLEMS  UNUSUAL RASH Items with * indicate a potential emergency and should be followed up as soon as possible.  Feel free to call the clinic should you have any questions or concerns. The clinic phone number is [phone].  Please show the [place] at check-in to the Emergency Department and triage nurse.

## 2021-01-03 ENCOUNTER — Ambulatory Visit: Payer: Medicare Other

## 2021-01-03 ENCOUNTER — Inpatient Hospital Stay: Payer: Medicare Other

## 2021-01-03 ENCOUNTER — Other Ambulatory Visit: Payer: Self-pay

## 2021-01-03 ENCOUNTER — Other Ambulatory Visit: Payer: Medicare Other

## 2021-01-03 VITALS — BP 130/70 | HR 66 | Temp 98.4°F | Resp 18

## 2021-01-03 DIAGNOSIS — Z9481 Bone marrow transplant status: Secondary | ICD-10-CM

## 2021-01-03 DIAGNOSIS — C9000 Multiple myeloma not having achieved remission: Secondary | ICD-10-CM

## 2021-01-03 DIAGNOSIS — T451X5A Adverse effect of antineoplastic and immunosuppressive drugs, initial encounter: Secondary | ICD-10-CM

## 2021-01-03 DIAGNOSIS — G620 Drug-induced polyneuropathy: Secondary | ICD-10-CM

## 2021-01-03 DIAGNOSIS — Z5112 Encounter for antineoplastic immunotherapy: Secondary | ICD-10-CM | POA: Diagnosis not present

## 2021-01-03 DIAGNOSIS — R9089 Other abnormal findings on diagnostic imaging of central nervous system: Secondary | ICD-10-CM

## 2021-01-03 LAB — CBC WITH DIFFERENTIAL (CANCER CENTER ONLY)
Abs Immature Granulocytes: 0.02 10*3/uL (ref 0.00–0.07)
Basophils Absolute: 0 10*3/uL (ref 0.0–0.1)
Basophils Relative: 0 %
Eosinophils Absolute: 0.1 10*3/uL (ref 0.0–0.5)
Eosinophils Relative: 2 %
HCT: 31.5 % — ABNORMAL LOW (ref 39.0–52.0)
Hemoglobin: 10.8 g/dL — ABNORMAL LOW (ref 13.0–17.0)
Immature Granulocytes: 0 %
Lymphocytes Relative: 17 %
Lymphs Abs: 1 10*3/uL (ref 0.7–4.0)
MCH: 34.2 pg — ABNORMAL HIGH (ref 26.0–34.0)
MCHC: 34.3 g/dL (ref 30.0–36.0)
MCV: 99.7 fL (ref 80.0–100.0)
Monocytes Absolute: 0.6 10*3/uL (ref 0.1–1.0)
Monocytes Relative: 11 %
Neutro Abs: 3.9 10*3/uL (ref 1.7–7.7)
Neutrophils Relative %: 70 %
Platelet Count: 82 10*3/uL — ABNORMAL LOW (ref 150–400)
RBC: 3.16 MIL/uL — ABNORMAL LOW (ref 4.22–5.81)
RDW: 16.2 % — ABNORMAL HIGH (ref 11.5–15.5)
WBC Count: 5.7 10*3/uL (ref 4.0–10.5)
nRBC: 0 % (ref 0.0–0.2)

## 2021-01-03 LAB — CMP (CANCER CENTER ONLY)
ALT: 13 U/L (ref 0–44)
AST: 18 U/L (ref 15–41)
Albumin: 3.5 g/dL (ref 3.5–5.0)
Alkaline Phosphatase: 53 U/L (ref 38–126)
Anion gap: 12 (ref 5–15)
BUN: 28 mg/dL — ABNORMAL HIGH (ref 8–23)
CO2: 22 mmol/L (ref 22–32)
Calcium: 8.8 mg/dL — ABNORMAL LOW (ref 8.9–10.3)
Chloride: 101 mmol/L (ref 98–111)
Creatinine: 1.06 mg/dL (ref 0.61–1.24)
GFR, Estimated: >60 mL/min (ref >60)
Glucose, Bld: 93 mg/dL (ref 70–99)
Potassium: 4.4 mmol/L (ref 3.5–5.1)
Sodium: 135 mmol/L (ref 135–145)
Total Bilirubin: 0.3 mg/dL (ref 0.3–1.2)
Total Protein: 7 g/dL (ref 6.5–8.1)

## 2021-01-03 MED ORDER — DEXAMETHASONE 4 MG PO TABS
ORAL_TABLET | ORAL | Status: AC
  Filled 2021-01-03: qty 5

## 2021-01-03 MED ORDER — MONTELUKAST SODIUM 10 MG PO TABS
10.0000 mg | ORAL_TABLET | Freq: Once | ORAL | Status: AC
  Administered 2021-01-03: 10 mg via ORAL

## 2021-01-03 MED ORDER — DARATUMUMAB-HYALURONIDASE-FIHJ 1800-30000 MG-UT/15ML ~~LOC~~ SOLN
1800.0000 mg | Freq: Once | SUBCUTANEOUS | Status: AC
  Administered 2021-01-03: 1800 mg via SUBCUTANEOUS
  Filled 2021-01-03: qty 15

## 2021-01-03 MED ORDER — ACETAMINOPHEN 325 MG PO TABS
ORAL_TABLET | ORAL | Status: AC
  Filled 2021-01-03: qty 2

## 2021-01-03 MED ORDER — MONTELUKAST SODIUM 10 MG PO TABS
ORAL_TABLET | ORAL | Status: AC
  Filled 2021-01-03: qty 1

## 2021-01-03 MED ORDER — DEXAMETHASONE 4 MG PO TABS
20.0000 mg | ORAL_TABLET | Freq: Once | ORAL | Status: AC
  Administered 2021-01-03: 20 mg via ORAL

## 2021-01-03 MED ORDER — ACETAMINOPHEN 325 MG PO TABS
650.0000 mg | ORAL_TABLET | Freq: Once | ORAL | Status: AC
  Administered 2021-01-03: 650 mg via ORAL

## 2021-01-03 MED ORDER — DIPHENHYDRAMINE HCL 25 MG PO CAPS
ORAL_CAPSULE | ORAL | Status: AC
  Filled 2021-01-03: qty 1

## 2021-01-03 MED ORDER — DIPHENHYDRAMINE HCL 25 MG PO CAPS
25.0000 mg | ORAL_CAPSULE | Freq: Once | ORAL | Status: AC
  Administered 2021-01-03: 25 mg via ORAL

## 2021-01-03 NOTE — Progress Notes (Signed)
Per Dr. [name] OK to treat with platelets of 82

## 2021-01-03 NOTE — Patient Instructions (Signed)
[facility] Discharge Instructions for Patients Receiving Chemotherapy  Today you received the following chemotherapy agents: darzalex  To help prevent nausea and vomiting after your treatment, we encourage you to take your nausea medication as directed    If you develop nausea and vomiting that is not controlled by your nausea medication, call the clinic.   BELOW ARE SYMPTOMS THAT SHOULD BE REPORTED IMMEDIATELY:  *FEVER GREATER THAN 100.5 F  *CHILLS WITH OR WITHOUT FEVER  NAUSEA AND VOMITING THAT IS NOT CONTROLLED WITH YOUR NAUSEA MEDICATION  *UNUSUAL SHORTNESS OF BREATH  *UNUSUAL BRUISING OR BLEEDING  TENDERNESS IN MOUTH AND THROAT WITH OR WITHOUT PRESENCE OF ULCERS  *URINARY PROBLEMS  *BOWEL PROBLEMS  UNUSUAL RASH Items with * indicate a potential emergency and should be followed up as soon as possible.  Feel free to call the clinic should you have any questions or concerns. The clinic phone number is [phone].  Please show the CHEMO ALERT CARD at check-in to the Emergency Department and triage nurse.   

## 2021-01-04 LAB — PROTEIN ELECTROPHORESIS, SERUM
A/G Ratio: 1.1 (ref 0.7–1.7)
Albumin ELP: 3.4 g/dL (ref 2.9–4.4)
Alpha-1-Globulin: 0.2 g/dL (ref 0.0–0.4)
Alpha-2-Globulin: 0.8 g/dL (ref 0.4–1.0)
Beta Globulin: 1 g/dL (ref 0.7–1.3)
Gamma Globulin: 1 g/dL (ref 0.4–1.8)
Globulin, Total: 3 g/dL (ref 2.2–3.9)
M-Spike, %: 0.6 g/dL — ABNORMAL HIGH
Total Protein ELP: 6.4 g/dL (ref 6.0–8.5)

## 2021-01-04 LAB — KAPPA/LAMBDA LIGHT CHAINS
Kappa free light chain: 6.7 mg/L (ref 3.3–19.4)
Kappa, lambda light chain ratio: 0.32 (ref 0.26–1.65)
Lambda free light chains: 21 mg/L (ref 5.7–26.3)

## 2021-01-04 LAB — IGG: IgG (Immunoglobin G), Serum: 988 mg/dL (ref 603–1613)

## 2021-01-07 ENCOUNTER — Other Ambulatory Visit: Payer: Self-pay | Admitting: Oncology

## 2021-01-10 ENCOUNTER — Inpatient Hospital Stay: Payer: Medicare Other

## 2021-01-10 ENCOUNTER — Other Ambulatory Visit: Payer: Self-pay

## 2021-01-10 ENCOUNTER — Other Ambulatory Visit: Payer: Self-pay | Admitting: Oncology

## 2021-01-10 VITALS — BP 122/76 | HR 93 | Temp 97.6°F | Resp 18 | Wt 180.8 lb

## 2021-01-10 DIAGNOSIS — Z9481 Bone marrow transplant status: Secondary | ICD-10-CM

## 2021-01-10 DIAGNOSIS — T451X5A Adverse effect of antineoplastic and immunosuppressive drugs, initial encounter: Secondary | ICD-10-CM

## 2021-01-10 DIAGNOSIS — C9000 Multiple myeloma not having achieved remission: Secondary | ICD-10-CM

## 2021-01-10 DIAGNOSIS — Z5112 Encounter for antineoplastic immunotherapy: Secondary | ICD-10-CM | POA: Diagnosis not present

## 2021-01-10 DIAGNOSIS — G620 Drug-induced polyneuropathy: Secondary | ICD-10-CM

## 2021-01-10 LAB — CBC WITH DIFFERENTIAL (CANCER CENTER ONLY)
Abs Immature Granulocytes: 0.02 10*3/uL (ref 0.00–0.07)
Basophils Absolute: 0 10*3/uL (ref 0.0–0.1)
Basophils Relative: 0 %
Eosinophils Absolute: 0.1 10*3/uL (ref 0.0–0.5)
Eosinophils Relative: 1 %
HCT: 33.7 % — ABNORMAL LOW (ref 39.0–52.0)
Hemoglobin: 11.1 g/dL — ABNORMAL LOW (ref 13.0–17.0)
Immature Granulocytes: 0 %
Lymphocytes Relative: 14 %
Lymphs Abs: 0.9 10*3/uL (ref 0.7–4.0)
MCH: 32.8 pg (ref 26.0–34.0)
MCHC: 32.9 g/dL (ref 30.0–36.0)
MCV: 99.7 fL (ref 80.0–100.0)
Monocytes Absolute: 0.5 10*3/uL (ref 0.1–1.0)
Monocytes Relative: 7 %
Neutro Abs: 5 10*3/uL (ref 1.7–7.7)
Neutrophils Relative %: 78 %
Platelet Count: 92 10*3/uL — ABNORMAL LOW (ref 150–400)
RBC: 3.38 MIL/uL — ABNORMAL LOW (ref 4.22–5.81)
RDW: 16.5 % — ABNORMAL HIGH (ref 11.5–15.5)
WBC Count: 6.4 10*3/uL (ref 4.0–10.5)
nRBC: 0 % (ref 0.0–0.2)

## 2021-01-10 LAB — CMP (CANCER CENTER ONLY)
ALT: 12 U/L (ref 0–44)
AST: 18 U/L (ref 15–41)
Albumin: 3.6 g/dL (ref 3.5–5.0)
Alkaline Phosphatase: 52 U/L (ref 38–126)
Anion gap: 10 (ref 5–15)
BUN: 29 mg/dL — ABNORMAL HIGH (ref 8–23)
CO2: 24 mmol/L (ref 22–32)
Calcium: 9.1 mg/dL (ref 8.9–10.3)
Chloride: 103 mmol/L (ref 98–111)
Creatinine: 1.14 mg/dL (ref 0.61–1.24)
GFR, Estimated: >60 mL/min (ref >60)
Glucose, Bld: 97 mg/dL (ref 70–99)
Potassium: 4.4 mmol/L (ref 3.5–5.1)
Sodium: 137 mmol/L (ref 135–145)
Total Bilirubin: 0.3 mg/dL (ref 0.3–1.2)
Total Protein: 6.7 g/dL (ref 6.5–8.1)

## 2021-01-10 MED ORDER — DIPHENHYDRAMINE HCL 25 MG PO CAPS
ORAL_CAPSULE | ORAL | Status: AC
  Filled 2021-01-10: qty 1

## 2021-01-10 MED ORDER — ACETAMINOPHEN 325 MG PO TABS
ORAL_TABLET | ORAL | Status: AC
  Filled 2021-01-10: qty 2

## 2021-01-10 MED ORDER — DEXAMETHASONE 4 MG PO TABS
20.0000 mg | ORAL_TABLET | Freq: Once | ORAL | Status: AC
  Administered 2021-01-10: 20 mg via ORAL

## 2021-01-10 MED ORDER — BORTEZOMIB CHEMO SQ INJECTION 3.5 MG (2.5MG/ML)
1.3000 mg/m2 | Freq: Once | INTRAMUSCULAR | Status: AC
  Administered 2021-01-10: 2.5 mg via SUBCUTANEOUS
  Filled 2021-01-10: qty 1

## 2021-01-10 MED ORDER — ACETAMINOPHEN 325 MG PO TABS
650.0000 mg | ORAL_TABLET | Freq: Once | ORAL | Status: AC
  Administered 2021-01-10: 650 mg via ORAL

## 2021-01-10 MED ORDER — DEXAMETHASONE 4 MG PO TABS
ORAL_TABLET | ORAL | Status: AC
  Filled 2021-01-10: qty 5

## 2021-01-10 MED ORDER — DIPHENHYDRAMINE HCL 25 MG PO CAPS
25.0000 mg | ORAL_CAPSULE | Freq: Once | ORAL | Status: AC
  Administered 2021-01-10: 25 mg via ORAL

## 2021-01-10 MED ORDER — DARATUMUMAB-HYALURONIDASE-FIHJ 1800-30000 MG-UT/15ML ~~LOC~~ SOLN
1800.0000 mg | Freq: Once | SUBCUTANEOUS | Status: AC
  Administered 2021-01-10: 1800 mg via SUBCUTANEOUS
  Filled 2021-01-10: qty 15

## 2021-01-10 NOTE — Progress Notes (Signed)
Per Dr [name], ok to treat with platelets of 92

## 2021-01-10 NOTE — Patient Instructions (Signed)
[facility] ONCOLOGY  Discharge Instructions: Thank you for choosing [facility] to provide your oncology and hematology care.   If you have a lab appointment with the [facility], please go directly to the [facility] and check in at the registration area.   Wear comfortable clothing and clothing appropriate for easy access to any Portacath or PICC line.   We strive to give you quality time with your provider. You may need to reschedule your appointment if you arrive late (15 or more minutes).  Arriving late affects you and other patients whose appointments are after yours.  Also, if you miss three or more appointments without notifying the office, you may be dismissed from the clinic at the provider's discretion.      For prescription refill requests, have your pharmacy contact our office and allow 72 hours for refills to be completed.    Today you received the following chemotherapy and/or immunotherapy agents: bortezomib/daratumumab.     To help prevent nausea and vomiting after your treatment, we encourage you to take your nausea medication as directed.  BELOW ARE SYMPTOMS THAT SHOULD BE REPORTED IMMEDIATELY: . *FEVER GREATER THAN 100.4 F (38 C) OR HIGHER . *CHILLS OR SWEATING . *NAUSEA AND VOMITING THAT IS NOT CONTROLLED WITH YOUR NAUSEA MEDICATION . *UNUSUAL SHORTNESS OF BREATH . *UNUSUAL BRUISING OR BLEEDING . *URINARY PROBLEMS (pain or burning when urinating, or frequent urination) . *BOWEL PROBLEMS (unusual diarrhea, constipation, pain near the anus) . TENDERNESS IN MOUTH AND THROAT WITH OR WITHOUT PRESENCE OF ULCERS (sore throat, sores in mouth, or a toothache) . UNUSUAL RASH, SWELLING OR PAIN  . UNUSUAL VAGINAL DISCHARGE OR ITCHING   Items with * indicate a potential emergency and should be followed up as soon as possible or go to the Emergency Department if any problems should occur.  Please show the CHEMOTHERAPY ALERT CARD or  IMMUNOTHERAPY ALERT CARD at check-in to the Emergency Department and triage nurse.  Should you have questions after your visit or need to cancel or reschedule your appointment, please contact [facility]  Dept: [phone]  and follow the prompts.  Office hours are 8:00 a.m. to 4:30 p.m. Monday - Friday. Please note that voicemails left after 4:00 p.m. may not be returned until the following business day.  We are closed weekends and major holidays. You have access to a nurse at all times for urgent questions. Please call the main number to the clinic Dept: [phone] and follow the prompts.   For any non-urgent questions, you may also contact your provider using MyChart. We now offer e-Visits for anyone 63 and older to request care online for non-urgent symptoms. For details visit [email or web address].   Also download the MyChart app! Go to the app store, search "MyChart", open the app, select [facility], and log in with your MyChart username and password.  Due to Covid, a mask is required upon entering the hospital/clinic. If you do not have a mask, one will be given to you upon arrival. For doctor visits, patients may have 1 support person aged 60 or older with them. For treatment visits, patients cannot have anyone with them due to current Covid guidelines and our immunocompromised population.

## 2021-01-16 NOTE — Progress Notes (Signed)
[facility]  [phone] [phone]    ID: Sayre Witherington  DOB: 12/07/1950  MR#: [id number] CSN#: [id number]   Patient Care Team: Cassandria Anger, MD as PCP - General (Internal Medicine) Ivonne Freeburg, [name], MD as Consulting Physician (Oncology) Concepcion Living, MD as Consulting Physician (Neurology) [name], MD as Consulting Physician (Internal Medicine) Kathrynn Ducking, MD as Consulting Physician (Neurology) Dorna Bloom, DDS as Referring Physician (Oral Surgery) OTHERS: Augustina Mood DDS   CHIEF COMPLAINT: Multiple myeloma  CURRENT TREATMENT: bortezomib/daratumumab/dexamethasone   INTERVAL HISTORY:  [name] returns today for follow-up and treatment of his multiple myeloma. To review: We noted evidence of disease progression on his myeloma panel.  We contacted Dr. Alvie Heidelberg at [facility] and we started him on bortezomib, daratumumab, dexamethasone in preparation for eventual CarTcell treatment..  01/11/21 he met virtually with Dr Alvie Heidelberg, who summarizes as follows:  (M-spike 10/21 0.3 2/22 0.6 3/22 0.8 4/22 0.6) 1. MM labs reviewed and c/w stable disease since initiation of darzalex/velcade/dexa 2. Continue current therapy for now 3. Monitor labs closely 4. CBC Hgb 10.8 wbc 5.7 plts 82 stable 5. Consider adjusting dexamethasone dose to 10 mg po day of darzalex and day after 6. RTN in 6 months 7. Will add to waiting list for CAR-T cell   He also continues on zolendronate, which he received 12/06/2020.  He tolerates this well except it makes him a bit confused for a few days, possibly due to calcium fluxes.  Lab Results  Component Value Date   TOTALPROTELP 6.4 01/03/2021   ALBUMINELP 3.4 01/03/2021   A1GS 0.2 01/03/2021   A2GS 0.8 01/03/2021   BETS 1.0 01/03/2021   BETA2SER 0.4 05/11/2015   GAMS 1.0 01/03/2021   MSPIKE 0.6 (H) 01/03/2021   SPEI Comment 01/03/2021    Lab Results  Component Value Date    KPAFRELGTCHN 6.7 01/03/2021   LAMBDASER 21.0 01/03/2021   KAPLAMBRATIO 0.32 01/03/2021     REVIEW OF SYSTEMS: [name] had an episode of diarrhea 2 days ago which lasted a day and a half.  He tells me he is better this morning but is feeling very fatigued.  He says his wife tells him he is confused sometimes.  He could not give me any examples.  He says he does strength exercises Monday Wednesday and Friday and either the treadmill or walking outside Tuesday Thursday and Saturday.  He has had no specific symptoms that he is aware of related to his current treatments.  He was able to give me a good account of Dr. Kendell Bane comments from their recent virtual visit.   COVID 19 VACCINATION STATUS: Bryson x2, most recently 11/2019   HISTORY OF PRESENT ILLNESS:  From the original intake nodes:  Dallis Lorimer Tiberio was worked up for peptic ulcer disease in August of 2011, with significant bleeding and anemia.  The patient was Helicobacter pylori negative.  He received some epinephrine when he had his EGD and then started on Protonix.  [name] anemia slowly resolved so that by September, his hemoglobin was up to 10.9 and by earlier this month, his hemoglobin was up to 12.5.    As part of his general workup, he was found to have a slightly elevated globulin fraction.  In September, his total protein was 8.3 with an albumin of 3.8.  In January, the total protein was 8.4 and albumin 3.6.  With persistence of this slight abnormality, Dr. Olevia Perches obtained serum immunofixation and SPEP.  The  SPTP showed an M-spike of 2.67 grams.  A total IgG was 4,190.  Total IgA low at 28.  Total IgM low at 28 also.  The immunofixation showed a monoclonal IgG lambda paraprotein.  There were also monoclonal free lambda light chains present. With this information, the patient was referred for further evaluation.   A diagnosis of myeloma was confirmed by bone marrow biopsy and subsequebnt treatment is as detailed below   PAST  MEDICAL HISTORY: Past Medical History:  Diagnosis Date  . Allergy   . Anemia   . Arthritis   . Asthma    no treatment x 20 years  . Depression   . Double vision    occurs at times   . Duodenal ulcer   . GERD (gastroesophageal reflux disease)   . Hyperlipidemia   . Hypertension   . Hypothyroidism   . Multiple myeloma 07/04/2011  . Thyroid disease   Significant for peptic ulcer disease as noted above. History of anxiety and depression.  History of GERD which is significantly improved with weight loss and history of reactive airway disease, possibly secondary to the GERD which also improved with weight loss.  There was a history of obesity, now much improved secondary to exercise and diet.    PAST SURGICAL HISTORY: Past Surgical History:  Procedure Laterality Date  . BONE MARROW TRANSPLANT  2011   for MM  . CARDIOLITE STUDY  11/25/2003   NORMAL  . TONSILLECTOMY      FAMILY HISTORY: Family History  Problem Relation Age of Onset  . Throat cancer Mother   . Hypertension Father   . Stroke Father   . Asthma Father   . Diabetes Father   . Pancreatic cancer Brother   [name] father died at the age of 70 following a stroke.  [name] mother died with cancer of the throat at the age of 8.   She had a history of depression and was a smoker.  Not clear how much alcohol she drank, according to the patient.  The patient had two brothers.  One died from the age of 8 from a "kidney problem."  The second one died at the age of 109 from pancreatic cancer. (This may have been duodenal cancer. 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  His wife of 37+ years was [name], but they divorced. In 2016 he remarried, his current wife's name is [name].  The patient has two daughters:  [name], who works in [place], who had her second child April 2016. Both daughters live here in [place].  [place] first daughter, [name], born 03/06/2013, apparently has Weber/Osler/Rendu. The patient is not a church attender.  [name] also participates in [place] even though actually there is no alcohol or drug problem in the family.     ADVANCED DIRECTIVES: In place   HEALTH MAINTENANCE:  Social History   Tobacco Use  . Smoking status: Former Smoker    Years: 3.00    Quit date: 03/27/1969    Years since quitting: 51.8  . Smokeless tobacco: Never Used  Substance Use Topics  . Alcohol use: No  . Drug use: No    Colonoscopy: January 2015  PSA: December 2018, normal/Dr. [name]  Lipid panel: per Dr. Alain Marion  Allergies  Allergen Reactions  .  Atorvastatin Other (See Comments)  . Rosuvastatin     Other reaction(s): Liver Disorder  . Crestor [Rosuvastatin Calcium]     ADVERSE EFFECTS ON LIVER  . Lipitor [Atorvastatin Calcium]   . Septra [Sulfamethoxazole-Trimethoprim] Rash   Current Outpatient Medications  Medication Sig Dispense Refill  . dexamethasone (DECADRON) 4 MG tablet Take 2.5 tablets (10 mg total) by mouth once a week. Take the day after chemotherapy. 30 tablet 3  . calcium-vitamin D (OSCAL WITH D) 500-200 MG-UNIT tablet Take 1 tablet by mouth daily.    . cetirizine (ZYRTEC) 10 MG tablet Take 10 mg by mouth daily.    . Cholecalciferol (VITAMIN D3) 2000 units capsule Take 1 capsule (2,000 Units total) by mouth daily. 100 capsule 3  . docusate sodium (COLACE) 100 MG capsule Take 100 mg by mouth daily as needed for mild constipation or moderate constipation.    [name] escitalopram (LEXAPRO) 10 MG tablet Take 1 tablet (10 mg total) by mouth daily. 90 tablet 3  . HYDROCORTISONE ACE, RECTAL, 30 MG SUPP 1 pr bid x 7 d prn 14 suppository 3  . levothyroxine (SYNTHROID) 150 MCG tablet TAKE 1 TABLET BY MOUTH EVERY MORNING ON AN EMPTY STOMACH 90 tablet 3  . LORazepam (ATIVAN) 0.5 MG tablet  Take 1 tablet (0.5 mg total) by mouth at bedtime as needed for anxiety. 20 tablet 0  . ondansetron (ZOFRAN) 8 MG tablet TAKE 1 TABLET(8 MG) BY MOUTH EVERY 8 HOURS AS NEEDED FOR NAUSEA OR VOMITING 20 tablet 3  . pantoprazole (PROTONIX) 40 MG tablet TAKE 1 TABLET BY MOUTH TWICE DAILY BEFORE MEALS 180 tablet 3  . prochlorperazine (COMPAZINE) 10 MG tablet Take 1 tablet (10 mg total) by mouth every 6 (six) hours as needed (Nausea or vomiting). 30 tablet 1  . triamcinolone ointment (KENALOG) 0.1 % Apply 1 application topically 2 (two) times daily. 80 g 2  . valACYclovir (VALTREX) 1000 MG tablet TAKE 1 TABLET(1000 MG) BY MOUTH DAILY 90 tablet 3  . zolendronic acid (ZOMETA) 4 MG/5ML injection Inject 4 mg into the vein every 3 (three) months.     No current facility-administered medications for this visit.   Objective: white man who appears fatigued Vitals:   01/17/21 1217  BP: (!) 142/61  Pulse: 80  Resp: 18  Temp: (!) 97.5 F (36.4 C)  SpO2: 99%   Wt Readings from Last 3 Encounters:  01/17/21 180 lb 12.8 oz (82 kg)  01/10/21 180 lb 12 oz (82 kg)  12/20/20 179 lb 12 oz (81.5 kg)   Body mass index is 26.7 kg/m.     ECOG FS:1 - Symptomatic but completely ambulatory  Sclerae unicteric, EOMs intact Wearing a mask No cervical or supraclavicular adenopathy Lungs no rales or rhonchi Heart regular rate and rhythm Abd soft, nontender, positive bowel sounds MSK no focal spinal tenderness, no upper extremity lymphedema Neuro: nonfocal, well oriented, appropriate affect   LAB RESULTS:  Appointment on 01/17/2021  Component Date Value Ref Range Status  . Sodium 01/17/2021 135  135 - 145 mmol/L Final  . Potassium 01/17/2021 4.2  3.5 - 5.1 mmol/L Final  . Chloride 01/17/2021 100  98 - 111 mmol/L Final  . CO2 01/17/2021 25  22 - 32 mmol/L Final  . Glucose, Bld 01/17/2021 86  70 - 99 mg/dL Final   Glucose reference range applies only to samples taken after fasting for at least 8 hours.  .  BUN 01/17/2021 29* 8 - 23 mg/dL Final  . Creatinine  01/17/2021 1.05  0.61 - 1.24 mg/dL Final  . Calcium 01/17/2021 9.4  8.9 - 10.3 mg/dL Final  . Total Protein 01/17/2021 6.6  6.5 - 8.1 g/dL Final  . Albumin 01/17/2021 3.6  3.5 - 5.0 g/dL Final  . AST 01/17/2021 20  15 - 41 U/L Final  . ALT 01/17/2021 12  0 - 44 U/L Final  . Alkaline Phosphatase 01/17/2021 50  38 - 126 U/L Final  . Total Bilirubin 01/17/2021 0.3  0.3 - 1.2 mg/dL Final  . GFR, Estimated 01/17/2021 >60  >60 mL/min Final   Comment: (NOTE) Calculated using the CKD-EPI Creatinine Equation (2021)   . Anion gap 01/17/2021 10  5 - 15 Final   Performed at [facility], [place] [address] [place], [place] [address]  . WBC Count 01/17/2021 6.1  4.0 - 10.5 K/uL Final  . RBC 01/17/2021 3.31* 4.22 - 5.81 MIL/uL Final  . Hemoglobin 01/17/2021 11.2* 13.0 - 17.0 g/dL Final  . HCT 01/17/2021 32.5* 39.0 - 52.0 % Final  . MCV 01/17/2021 98.2  80.0 - 100.0 fL Final  . MCH 01/17/2021 33.8  26.0 - 34.0 pg Final  . MCHC 01/17/2021 34.5  30.0 - 36.0 g/dL Final  . RDW 01/17/2021 16.7* 11.5 - 15.5 % Final  . Platelet Count 01/17/2021 81* 150 - 400 K/uL Final  . nRBC 01/17/2021 0.0  0.0 - 0.2 % Final  . Neutrophils Relative % 01/17/2021 67  % Final  . Neutro Abs 01/17/2021 4.1  1.7 - 7.7 K/uL Final  . Lymphocytes Relative 01/17/2021 21  % Final  . Lymphs Abs 01/17/2021 1.2  0.7 - 4.0 K/uL Final  . Monocytes Relative 01/17/2021 11  % Final  . Monocytes Absolute 01/17/2021 0.7  0.1 - 1.0 K/uL Final  . Eosinophils Relative 01/17/2021 1  % Final  . Eosinophils Absolute 01/17/2021 0.1  0.0 - 0.5 K/uL Final  . Basophils Relative 01/17/2021 0  % Final  . Basophils Absolute 01/17/2021 0.0  0.0 - 0.1 K/uL Final  . Immature Granulocytes 01/17/2021 0  % Final  . Abs Immature Granulocytes 01/17/2021 0.01  0.00 - 0.07 K/uL Final   Performed at [facility], [place] [address], [place], [place] [address]     STUDIES: No results found.    ASSESSMENT: 70 y.o. [place] man with a history of multiple myeloma diagnosed February of 2012, with an initial M-spike of 2.6 g/dL, IFE showing an IgG lambda paraprotein and free lambda light chains in the urine. Cytogenetics showed trisomy 107. Bone marrow biopsy showed a 22% plasmacytosis. Treated with   (1) bortezomib (subcutaneously), lenalidomide, and dexamethasone, with repeat bone marrow biopsy May of 2012 showing 10% plasmacytosis   (2) high-dose chemotherapy with BCNU and melphalan at [facility], followed by stem cell rescue July of 2012   (3) on zoledronic acid started December of 2012, initially monthly, currently given every 3 months, most recent dose  12/07/2015  (4) low-dose lenalidomide resumed April 2013, interrupted several times.  Resumed again on 02/19/2013, ata dose of 5 mg daily, 21 days on and 7 days off, later further reduced to 7 days on, 7 days off  (5) CNS symptoms and abnormal brain MRI extensively evaluated by neurology with no definitive diagnosis established  (6) rising M spike noted June 2014 but did not meet criteria for carfilzomib study  (7) on lenalidomide 25 mg daily, 14 days on, 7 days off, starting 04/18/2013, interrupted December 2014 because of rash;   (  a) resumed January 2015 at 10 mg/ day at 21 days on/ 7 days off  (Camari) starting 08/18/2014 decreased to 10 mg/ day 14 days on, 7 days off because of cytopenias  (c) as of February 2016 was on 5 mg daily 7 days on 7 days off  (d) lenalidomide discontinued December 2016 with evidence of disease progression  (8) transient global amnesia 05/29/2015, resolved without intervention  (9) starting PVD 10/25/2015 w ASA 325 thromboprophylaxis, valacyclovir prophylaxis, last dose 12/17/2015  (a) pomalidomide 4 mg/d days 1-14  (Caidyn) bortezomib sQ days 2,5,9,12 of each 21 day cycle  (c) dexamethasome 20 mg two days a week  (d) dexamethasone bortezomib and pomalidomide discontinued late  December 2018 with poor tolerance  (10) metapneumovirus pneumonia April 2017  (a) completing course of steroids and week of bactrim mid April 2017  (11) status post second autologous transplant at [facility] 02/04/2016(preparatory regimen melphalan 200 mg/m)  (a) received twelve-month vaccinations 03/14/2017 (DPT, Haemophilus, Pneumovax 13, polio)  (Vada) 14 months injections 05/04/2016 include DTaP, Hib conjugate, HepB energex Phelan 20 mcg/ml, Prevnar 13  (c) 24 month vaccines due at [facility] June 2019  (12) maintenance therapy started November 2017, consisting of  (a) bortezomib 1.3 mg/M2 every 14 days, first dose 07/27/2016  (Ewin) pomalidomide 1 mg days 1-21 Q28 days, started 07/19/2016  (c) zolendronate monthly started 07/27/2016 (previously Q 3 months) however patient unable to tolerate, and changed back to q3 months in April, 2018   (d) Bortezomib changed to monthly as of June 2018 because of tolerance issues, however discontinued after 09/14/2017 dose because of a rise in his M spike  (e) pomalidomide held after 10/18/2017 in preparation for possible study at [facility] (venetoclax)--never resumed  (f) with numbers actually improved off treatment, resumed every 2-week bortezomib 12/25/2017  (g) changed to every 3-week bortezomib as of 03/17/2019  (h) briefly on weekly treatments times 22 October 2020 due to increase in M spike  (I) maintenance therapy discontinued with evidence of progression  (13) bortezomib/daratumumab/dexamethasone started 12/20/2020.  (a) Decadron dose dropped to 10 mg day of and day following treatment starting May 2022   PLAN:  [name] is a little over 10 years out from initial diagnosis of malignant melanoma.  He has undergone 2 autologous transplants, most recently May 2017, 5 years ago.  More recently there was evidence of progression in his maintenance therapy and he was started on his current combination of bortezomib with daratumumab and dexamethasone.  Per Dr. Kendell Bane  recent note the plan is to stabilize or if possible induced as second remission and then proceed to CAR T-cells.  [name] has many questions regarding the CAR-T cell treatment and he and his wife are reading up on it.  In the meantime we are continuing treatment as already started.  He appears to be responding although there is no definitive trend yet.  I am concerned that he may have had a viral illness this past 3 days, with diarrhea and continuing fatigue.  I think it would be prudent to hold off on treatment this week, let his platelets recover a little bit, and then resume therapy beginning 01/24/2021.  He is agreeable to this.  He knows to call for any other issue that may develop before then.  Total encounter time 35 minutes.*  Freedom Peddy, [name], MD  01/17/21 2:14 PM Medical Oncology and Hematology [facility] [place], [place] [address] Tel. [phone]    Fax. [phone]    I,  Wilburn Mylar, am acting as scribe for Dr. Sarajane Jews C. Klara Stjames.  I, Lurline Del MD, have reviewed the above documentation for accuracy and completeness, and I agree with the above.   *Total Encounter Time as defined by the Centers for Medicare and Medicaid Services includes, in addition to the face-to-face time of a patient visit (documented in the note above) non-face-to-face time: obtaining and reviewing outside history, ordering and reviewing medications, tests or procedures, care coordination (communications with other health care professionals or caregivers) and documentation in the medical record.

## 2021-01-17 ENCOUNTER — Other Ambulatory Visit: Payer: Medicare Other

## 2021-01-17 ENCOUNTER — Inpatient Hospital Stay: Payer: Medicare Other | Attending: Oncology

## 2021-01-17 ENCOUNTER — Inpatient Hospital Stay (HOSPITAL_BASED_OUTPATIENT_CLINIC_OR_DEPARTMENT_OTHER): Payer: Medicare Other | Admitting: Oncology

## 2021-01-17 ENCOUNTER — Other Ambulatory Visit: Payer: Self-pay

## 2021-01-17 ENCOUNTER — Inpatient Hospital Stay: Payer: Medicare Other

## 2021-01-17 ENCOUNTER — Ambulatory Visit: Payer: Medicare Other

## 2021-01-17 VITALS — BP 142/61 | HR 80 | Temp 97.5°F | Resp 18 | Ht 69.0 in | Wt 180.8 lb

## 2021-01-17 DIAGNOSIS — Z5112 Encounter for antineoplastic immunotherapy: Secondary | ICD-10-CM | POA: Diagnosis present

## 2021-01-17 DIAGNOSIS — C9000 Multiple myeloma not having achieved remission: Secondary | ICD-10-CM | POA: Diagnosis not present

## 2021-01-17 DIAGNOSIS — T451X5A Adverse effect of antineoplastic and immunosuppressive drugs, initial encounter: Secondary | ICD-10-CM

## 2021-01-17 DIAGNOSIS — Z79899 Other long term (current) drug therapy: Secondary | ICD-10-CM | POA: Insufficient documentation

## 2021-01-17 DIAGNOSIS — Z9481 Bone marrow transplant status: Secondary | ICD-10-CM

## 2021-01-17 DIAGNOSIS — R9089 Other abnormal findings on diagnostic imaging of central nervous system: Secondary | ICD-10-CM

## 2021-01-17 LAB — CMP (CANCER CENTER ONLY)
ALT: 12 U/L (ref 0–44)
AST: 20 U/L (ref 15–41)
Albumin: 3.6 g/dL (ref 3.5–5.0)
Alkaline Phosphatase: 50 U/L (ref 38–126)
Anion gap: 10 (ref 5–15)
BUN: 29 mg/dL — ABNORMAL HIGH (ref 8–23)
CO2: 25 mmol/L (ref 22–32)
Calcium: 9.4 mg/dL (ref 8.9–10.3)
Chloride: 100 mmol/L (ref 98–111)
Creatinine: 1.05 mg/dL (ref 0.61–1.24)
GFR, Estimated: >60 mL/min (ref >60)
Glucose, Bld: 86 mg/dL (ref 70–99)
Potassium: 4.2 mmol/L (ref 3.5–5.1)
Sodium: 135 mmol/L (ref 135–145)
Total Bilirubin: 0.3 mg/dL (ref 0.3–1.2)
Total Protein: 6.6 g/dL (ref 6.5–8.1)

## 2021-01-17 LAB — CBC WITH DIFFERENTIAL (CANCER CENTER ONLY)
Abs Immature Granulocytes: 0.01 10*3/uL (ref 0.00–0.07)
Basophils Absolute: 0 10*3/uL (ref 0.0–0.1)
Basophils Relative: 0 %
Eosinophils Absolute: 0.1 10*3/uL (ref 0.0–0.5)
Eosinophils Relative: 1 %
HCT: 32.5 % — ABNORMAL LOW (ref 39.0–52.0)
Hemoglobin: 11.2 g/dL — ABNORMAL LOW (ref 13.0–17.0)
Immature Granulocytes: 0 %
Lymphocytes Relative: 21 %
Lymphs Abs: 1.2 10*3/uL (ref 0.7–4.0)
MCH: 33.8 pg (ref 26.0–34.0)
MCHC: 34.5 g/dL (ref 30.0–36.0)
MCV: 98.2 fL (ref 80.0–100.0)
Monocytes Absolute: 0.7 10*3/uL (ref 0.1–1.0)
Monocytes Relative: 11 %
Neutro Abs: 4.1 10*3/uL (ref 1.7–7.7)
Neutrophils Relative %: 67 %
Platelet Count: 81 10*3/uL — ABNORMAL LOW (ref 150–400)
RBC: 3.31 MIL/uL — ABNORMAL LOW (ref 4.22–5.81)
RDW: 16.7 % — ABNORMAL HIGH (ref 11.5–15.5)
WBC Count: 6.1 10*3/uL (ref 4.0–10.5)
nRBC: 0 % (ref 0.0–0.2)

## 2021-01-17 MED ORDER — DEXAMETHASONE 4 MG PO TABS
10.0000 mg | ORAL_TABLET | ORAL | 2 refills | Status: DC
  Filled 2021-05-16: qty 30, 84d supply, fill #0

## 2021-01-18 LAB — IGG: IgG (Immunoglobin G), Serum: 884 mg/dL (ref 603–1613)

## 2021-01-25 ENCOUNTER — Inpatient Hospital Stay: Payer: Medicare Other

## 2021-01-25 ENCOUNTER — Ambulatory Visit (HOSPITAL_BASED_OUTPATIENT_CLINIC_OR_DEPARTMENT_OTHER): Payer: Medicare Other | Admitting: Medical

## 2021-01-25 ENCOUNTER — Other Ambulatory Visit: Payer: Self-pay

## 2021-01-25 ENCOUNTER — Telehealth: Payer: Self-pay | Admitting: *Deleted

## 2021-01-25 ENCOUNTER — Other Ambulatory Visit: Payer: Self-pay | Admitting: Medical

## 2021-01-25 VITALS — BP 116/62 | HR 86 | Temp 98.8°F | Resp 18

## 2021-01-25 DIAGNOSIS — Z9481 Bone marrow transplant status: Secondary | ICD-10-CM

## 2021-01-25 DIAGNOSIS — C9000 Multiple myeloma not having achieved remission: Secondary | ICD-10-CM

## 2021-01-25 DIAGNOSIS — R21 Rash and other nonspecific skin eruption: Secondary | ICD-10-CM

## 2021-01-25 DIAGNOSIS — T451X5A Adverse effect of antineoplastic and immunosuppressive drugs, initial encounter: Secondary | ICD-10-CM

## 2021-01-25 DIAGNOSIS — R9089 Other abnormal findings on diagnostic imaging of central nervous system: Secondary | ICD-10-CM

## 2021-01-25 DIAGNOSIS — G620 Drug-induced polyneuropathy: Secondary | ICD-10-CM

## 2021-01-25 DIAGNOSIS — Z5112 Encounter for antineoplastic immunotherapy: Secondary | ICD-10-CM | POA: Diagnosis not present

## 2021-01-25 LAB — CBC WITH DIFFERENTIAL (CANCER CENTER ONLY)
Abs Immature Granulocytes: 0.01 10*3/uL (ref 0.00–0.07)
Basophils Absolute: 0 10*3/uL (ref 0.0–0.1)
Basophils Relative: 0 %
Eosinophils Absolute: 0 10*3/uL (ref 0.0–0.5)
Eosinophils Relative: 1 %
HCT: 30.8 % — ABNORMAL LOW (ref 39.0–52.0)
Hemoglobin: 10.6 g/dL — ABNORMAL LOW (ref 13.0–17.0)
Immature Granulocytes: 0 %
Lymphocytes Relative: 11 %
Lymphs Abs: 0.8 10*3/uL (ref 0.7–4.0)
MCH: 34.1 pg — ABNORMAL HIGH (ref 26.0–34.0)
MCHC: 34.4 g/dL (ref 30.0–36.0)
MCV: 99 fL (ref 80.0–100.0)
Monocytes Absolute: 0.7 10*3/uL (ref 0.1–1.0)
Monocytes Relative: 9 %
Neutro Abs: 5.8 10*3/uL (ref 1.7–7.7)
Neutrophils Relative %: 79 %
Platelet Count: 96 10*3/uL — ABNORMAL LOW (ref 150–400)
RBC: 3.11 MIL/uL — ABNORMAL LOW (ref 4.22–5.81)
RDW: 17.1 % — ABNORMAL HIGH (ref 11.5–15.5)
WBC Count: 7.3 10*3/uL (ref 4.0–10.5)
nRBC: 0 % (ref 0.0–0.2)

## 2021-01-25 LAB — CMP (CANCER CENTER ONLY)
ALT: 11 U/L (ref 0–44)
AST: 21 U/L (ref 15–41)
Albumin: 3.6 g/dL (ref 3.5–5.0)
Alkaline Phosphatase: 53 U/L (ref 38–126)
Anion gap: 8 (ref 5–15)
BUN: 32 mg/dL — ABNORMAL HIGH (ref 8–23)
CO2: 24 mmol/L (ref 22–32)
Calcium: 9.5 mg/dL (ref 8.9–10.3)
Chloride: 102 mmol/L (ref 98–111)
Creatinine: 1.34 mg/dL — ABNORMAL HIGH (ref 0.61–1.24)
GFR, Estimated: 57 mL/min — ABNORMAL LOW (ref >60)
Glucose, Bld: 93 mg/dL (ref 70–99)
Potassium: 4.7 mmol/L (ref 3.5–5.1)
Sodium: 134 mmol/L — ABNORMAL LOW (ref 135–145)
Total Bilirubin: 0.3 mg/dL (ref 0.3–1.2)
Total Protein: 6.6 g/dL (ref 6.5–8.1)

## 2021-01-25 MED ORDER — BORTEZOMIB CHEMO SQ INJECTION 3.5 MG (2.5MG/ML)
1.3000 mg/m2 | Freq: Once | INTRAMUSCULAR | Status: AC
  Administered 2021-01-25: 2.5 mg via SUBCUTANEOUS
  Filled 2021-01-25: qty 1

## 2021-01-25 MED ORDER — DEXAMETHASONE 4 MG PO TABS
10.0000 mg | ORAL_TABLET | Freq: Once | ORAL | Status: AC
  Administered 2021-01-25: 10 mg via ORAL

## 2021-01-25 MED ORDER — ACETAMINOPHEN 325 MG PO TABS
ORAL_TABLET | ORAL | Status: AC
  Filled 2021-01-25: qty 2

## 2021-01-25 MED ORDER — DIPHENHYDRAMINE HCL 25 MG PO CAPS
25.0000 mg | ORAL_CAPSULE | Freq: Once | ORAL | Status: AC
  Administered 2021-01-25: 25 mg via ORAL

## 2021-01-25 MED ORDER — HYDROCORTISONE VALERATE 0.2 % EX CREA: 1.0000 "application " | TOPICAL_CREAM | Freq: Two times a day (BID) | CUTANEOUS | 2 refills | Status: DC

## 2021-01-25 MED ORDER — DEXAMETHASONE 4 MG PO TABS
ORAL_TABLET | ORAL | Status: AC
  Filled 2021-01-25: qty 3

## 2021-01-25 MED ORDER — DARATUMUMAB-HYALURONIDASE-FIHJ 1800-30000 MG-UT/15ML ~~LOC~~ SOLN
1800.0000 mg | Freq: Once | SUBCUTANEOUS | Status: AC
  Administered 2021-01-25: 1800 mg via SUBCUTANEOUS
  Filled 2021-01-25: qty 15

## 2021-01-25 MED ORDER — DIPHENHYDRAMINE HCL 25 MG PO CAPS
ORAL_CAPSULE | ORAL | Status: AC
  Filled 2021-01-25: qty 2

## 2021-01-25 MED ORDER — ACETAMINOPHEN 325 MG PO TABS
650.0000 mg | ORAL_TABLET | Freq: Once | ORAL | Status: AC
  Administered 2021-01-25: 650 mg via ORAL

## 2021-01-25 NOTE — Telephone Encounter (Signed)
Per MD - order given to proceed with treatment with platelet count of 96.  Informed treatment room nurse.

## 2021-01-25 NOTE — Patient Instructions (Signed)
[facility] MEDICAL ONCOLOGY  Discharge Instructions: Thank you for choosing [facility] to provide your oncology and hematology care.   If you have a lab appointment with the Cancer Center, please go directly to the Cancer Center and check in at the registration area.   Wear comfortable clothing and clothing appropriate for easy access to any Portacath or PICC line.   We strive to give you quality time with your provider. You may need to reschedule your appointment if you arrive late (15 or more minutes).  Arriving late affects you and other patients whose appointments are after yours.  Also, if you miss three or more appointments without notifying the office, you may be dismissed from the clinic at the provider's discretion.      For prescription refill requests, have your pharmacy contact our office and allow 72 hours for refills to be completed.    Today you received the following chemotherapy and/or immunotherapy agents boretizomib, daratummumab   To help prevent nausea and vomiting after your treatment, we encourage you to take your nausea medication as directed.  BELOW ARE SYMPTOMS THAT SHOULD BE REPORTED IMMEDIATELY: . *FEVER GREATER THAN 100.4 F (38 C) OR HIGHER . *CHILLS OR SWEATING . *NAUSEA AND VOMITING THAT IS NOT CONTROLLED WITH YOUR NAUSEA MEDICATION . *UNUSUAL SHORTNESS OF BREATH . *UNUSUAL BRUISING OR BLEEDING . *URINARY PROBLEMS (pain or burning when urinating, or frequent urination) . *BOWEL PROBLEMS (unusual diarrhea, constipation, pain near the anus) . TENDERNESS IN MOUTH AND THROAT WITH OR WITHOUT PRESENCE OF ULCERS (sore throat, sores in mouth, or a toothache) . UNUSUAL RASH, SWELLING OR PAIN  . UNUSUAL VAGINAL DISCHARGE OR ITCHING   Items with * indicate a potential emergency and should be followed up as soon as possible or go to the Emergency Department if any problems should occur.  Please show the CHEMOTHERAPY ALERT CARD or  IMMUNOTHERAPY ALERT CARD at check-in to the Emergency Department and triage nurse.  Should you have questions after your visit or need to cancel or reschedule your appointment, please contact [facility] MEDICAL ONCOLOG[facility]  Dept: [phone]  and follow the prompts.  Office hours are 8:00 a.m. to 4:30 p.m. Monday - Friday. Please note that voicemails left after 4:00 p.m. may not be returned until the following business day.  We are closed weekends and major holidays. You have access to a nurse at all times for urgent questions. Please call the main number to the clinic Dept: [phone] and follow the prompts.   For any non-urgent questions, you may also contact your provider using MyChart. We now offer e-Visits for anyone 18 and older to request care online for non-urgent symptoms. For details visit [email or web address].   Also download the MyChart app! Go to the app store, search "MyChart", open the app, select [facility], and log in with your MyChart username and password.  Due to Covid, a mask is required upon entering the hospital/clinic. If you do not have a mask, one will be given to you upon arrival. For doctor visits, patients may have 1 support person aged 18 or older with them. For treatment visits, patients cannot have anyone with them due to current Covid guidelines and our immunocompromised population.   

## 2021-01-25 NOTE — Progress Notes (Signed)
The patient was seen in the infusion room today as he presented for therapy.  He reports having a diffuse pruritic rash over his left shoulder and left lateral foot.  He has been using Benadryl cream and oral Benadryl with benefits.  A diffuse area of excoriations with multiple pinpoint lesions were noted.  [name] left lateral shoulder.  A prescription for Westcort cream was sent to his pharmacy.  Sandi Mealy, MHS, PA-C Physician Assistant

## 2021-01-26 LAB — KAPPA/LAMBDA LIGHT CHAINS
Kappa free light chain: 8.1 mg/L (ref 3.3–19.4)
Kappa, lambda light chain ratio: 0.33 (ref 0.26–1.65)
Lambda free light chains: 24.4 mg/L (ref 5.7–26.3)

## 2021-01-26 LAB — IGG: IgG (Immunoglobin G), Serum: 788 mg/dL (ref 603–1613)

## 2021-01-27 LAB — PROTEIN ELECTROPHORESIS, SERUM
A/G Ratio: 1.4 (ref 0.7–1.7)
Albumin ELP: 3.5 g/dL (ref 2.9–4.4)
Alpha-1-Globulin: 0.2 g/dL (ref 0.0–0.4)
Alpha-2-Globulin: 0.7 g/dL (ref 0.4–1.0)
Beta Globulin: 1 g/dL (ref 0.7–1.3)
Gamma Globulin: 0.7 g/dL (ref 0.4–1.8)
Globulin, Total: 2.5 g/dL (ref 2.2–3.9)
M-Spike, %: 0.4 g/dL — ABNORMAL HIGH
Total Protein ELP: 6 g/dL (ref 6.0–8.5)

## 2021-01-28 ENCOUNTER — Other Ambulatory Visit: Payer: Self-pay | Admitting: *Deleted

## 2021-01-28 DIAGNOSIS — C9000 Multiple myeloma not having achieved remission: Secondary | ICD-10-CM

## 2021-01-31 ENCOUNTER — Other Ambulatory Visit: Payer: Self-pay

## 2021-01-31 ENCOUNTER — Ambulatory Visit: Payer: Medicare Other

## 2021-01-31 ENCOUNTER — Inpatient Hospital Stay: Payer: Medicare Other

## 2021-01-31 ENCOUNTER — Other Ambulatory Visit: Payer: Medicare Other

## 2021-01-31 VITALS — BP 118/75 | HR 95 | Temp 97.9°F | Resp 16

## 2021-01-31 DIAGNOSIS — G620 Drug-induced polyneuropathy: Secondary | ICD-10-CM

## 2021-01-31 DIAGNOSIS — T451X5A Adverse effect of antineoplastic and immunosuppressive drugs, initial encounter: Secondary | ICD-10-CM

## 2021-01-31 DIAGNOSIS — Z5112 Encounter for antineoplastic immunotherapy: Secondary | ICD-10-CM | POA: Diagnosis not present

## 2021-01-31 DIAGNOSIS — Z9481 Bone marrow transplant status: Secondary | ICD-10-CM

## 2021-01-31 DIAGNOSIS — R9089 Other abnormal findings on diagnostic imaging of central nervous system: Secondary | ICD-10-CM

## 2021-01-31 DIAGNOSIS — C9000 Multiple myeloma not having achieved remission: Secondary | ICD-10-CM

## 2021-01-31 LAB — CBC WITH DIFFERENTIAL (CANCER CENTER ONLY)
Abs Immature Granulocytes: 0.02 10*3/uL (ref 0.00–0.07)
Basophils Absolute: 0 10*3/uL (ref 0.0–0.1)
Basophils Relative: 0 %
Eosinophils Absolute: 0.1 10*3/uL (ref 0.0–0.5)
Eosinophils Relative: 1 %
HCT: 34.2 % — ABNORMAL LOW (ref 39.0–52.0)
Hemoglobin: 11.7 g/dL — ABNORMAL LOW (ref 13.0–17.0)
Immature Granulocytes: 0 %
Lymphocytes Relative: 20 %
Lymphs Abs: 1.2 10*3/uL (ref 0.7–4.0)
MCH: 33.9 pg (ref 26.0–34.0)
MCHC: 34.2 g/dL (ref 30.0–36.0)
MCV: 99.1 fL (ref 80.0–100.0)
Monocytes Absolute: 0.8 10*3/uL (ref 0.1–1.0)
Monocytes Relative: 13 %
Neutro Abs: 3.9 10*3/uL (ref 1.7–7.7)
Neutrophils Relative %: 66 %
Platelet Count: 95 10*3/uL — ABNORMAL LOW (ref 150–400)
RBC: 3.45 MIL/uL — ABNORMAL LOW (ref 4.22–5.81)
RDW: 17.2 % — ABNORMAL HIGH (ref 11.5–15.5)
WBC Count: 6.1 10*3/uL (ref 4.0–10.5)
nRBC: 0 % (ref 0.0–0.2)

## 2021-01-31 LAB — CMP (CANCER CENTER ONLY)
ALT: 12 U/L (ref 0–44)
AST: 20 U/L (ref 15–41)
Albumin: 3.8 g/dL (ref 3.5–5.0)
Alkaline Phosphatase: 52 U/L (ref 38–126)
Anion gap: 8 (ref 5–15)
BUN: 32 mg/dL — ABNORMAL HIGH (ref 8–23)
CO2: 26 mmol/L (ref 22–32)
Calcium: 9.9 mg/dL (ref 8.9–10.3)
Chloride: 100 mmol/L (ref 98–111)
Creatinine: 1.25 mg/dL — ABNORMAL HIGH (ref 0.61–1.24)
GFR, Estimated: >60 mL/min (ref >60)
Glucose, Bld: 85 mg/dL (ref 70–99)
Potassium: 4.5 mmol/L (ref 3.5–5.1)
Sodium: 134 mmol/L — ABNORMAL LOW (ref 135–145)
Total Bilirubin: 0.3 mg/dL (ref 0.3–1.2)
Total Protein: 6.8 g/dL (ref 6.5–8.1)

## 2021-01-31 MED ORDER — ACETAMINOPHEN 325 MG PO TABS
650.0000 mg | ORAL_TABLET | Freq: Once | ORAL | Status: AC
  Administered 2021-01-31: 650 mg via ORAL

## 2021-01-31 MED ORDER — DEXAMETHASONE 4 MG PO TABS
ORAL_TABLET | ORAL | Status: AC
  Filled 2021-01-31: qty 3

## 2021-01-31 MED ORDER — DIPHENHYDRAMINE HCL 25 MG PO CAPS
ORAL_CAPSULE | ORAL | Status: AC
  Filled 2021-01-31: qty 1

## 2021-01-31 MED ORDER — ACETAMINOPHEN 325 MG PO TABS
ORAL_TABLET | ORAL | Status: AC
  Filled 2021-01-31: qty 2

## 2021-01-31 MED ORDER — DIPHENHYDRAMINE HCL 25 MG PO CAPS
25.0000 mg | ORAL_CAPSULE | Freq: Once | ORAL | Status: AC
  Administered 2021-01-31: 25 mg via ORAL

## 2021-01-31 MED ORDER — DEXAMETHASONE 4 MG PO TABS
10.0000 mg | ORAL_TABLET | Freq: Once | ORAL | Status: AC
  Administered 2021-01-31: 10 mg via ORAL

## 2021-01-31 MED ORDER — DARATUMUMAB-HYALURONIDASE-FIHJ 1800-30000 MG-UT/15ML ~~LOC~~ SOLN
1800.0000 mg | Freq: Once | SUBCUTANEOUS | Status: AC
  Administered 2021-01-31: 1800 mg via SUBCUTANEOUS
  Filled 2021-01-31: qty 15

## 2021-01-31 NOTE — Patient Instructions (Signed)
[facility] ONCOLOGY  Discharge Instructions: Thank you for choosing [facility] to provide your oncology and hematology care.   If you have a lab appointment with the [facility], please go directly to the [facility] and check in at the registration area.   Wear comfortable clothing and clothing appropriate for easy access to any Portacath or PICC line.   We strive to give you quality time with your provider. You may need to reschedule your appointment if you arrive late (15 or more minutes).  Arriving late affects you and other patients whose appointments are after yours.  Also, if you miss three or more appointments without notifying the office, you may be dismissed from the clinic at the provider's discretion.      For prescription refill requests, have your pharmacy contact our office and allow 72 hours for refills to be completed.    Today you received the following chemotherapy and/or immunotherapy agents: daratumumab and hyaluronidase (Darzalex FASPRO).      To help prevent nausea and vomiting after your treatment, we encourage you to take your nausea medication as directed.  BELOW ARE SYMPTOMS THAT SHOULD BE REPORTED IMMEDIATELY: . *FEVER GREATER THAN 100.4 F (38 C) OR HIGHER . *CHILLS OR SWEATING . *NAUSEA AND VOMITING THAT IS NOT CONTROLLED WITH YOUR NAUSEA MEDICATION . *UNUSUAL SHORTNESS OF BREATH . *UNUSUAL BRUISING OR BLEEDING . *URINARY PROBLEMS (pain or burning when urinating, or frequent urination) . *BOWEL PROBLEMS (unusual diarrhea, constipation, pain near the anus) . TENDERNESS IN MOUTH AND THROAT WITH OR WITHOUT PRESENCE OF ULCERS (sore throat, sores in mouth, or a toothache) . UNUSUAL RASH, SWELLING OR PAIN  . UNUSUAL VAGINAL DISCHARGE OR ITCHING   Items with * indicate a potential emergency and should be followed up as soon as possible or go to the Emergency Department if any problems should occur.  Please show the  CHEMOTHERAPY ALERT CARD or IMMUNOTHERAPY ALERT CARD at check-in to the Emergency Department and triage nurse.  Should you have questions after your visit or need to cancel or reschedule your appointment, please contact [facility]  Dept: [phone]  and follow the prompts.  Office hours are 8:00 a.m. to 4:30 p.m. Monday - Friday. Please note that voicemails left after 4:00 p.m. may not be returned until the following business day.  We are closed weekends and major holidays. You have access to a nurse at all times for urgent questions. Please call the main number to the clinic Dept: [phone] and follow the prompts.   For any non-urgent questions, you may also contact your provider using MyChart. We now offer e-Visits for anyone 40 and older to request care online for non-urgent symptoms. For details visit [email or web address].   Also download the MyChart app! Go to the app store, search "MyChart", open the app, select [facility], and log in with your MyChart username and password.  Due to Covid, a mask is required upon entering the hospital/clinic. If you do not have a mask, one will be given to you upon arrival. For doctor visits, patients may have 1 support person aged 72 or older with them. For treatment visits, patients cannot have anyone with them due to current Covid guidelines and our immunocompromised population.

## 2021-01-31 NOTE — Progress Notes (Signed)
Per Dr. [name], ok to treat with low platelets. No additional orders received.

## 2021-02-01 ENCOUNTER — Telehealth: Payer: Self-pay | Admitting: *Deleted

## 2021-02-01 LAB — IGG: IgG (Immunoglobin G), Serum: 834 mg/dL (ref 603–1613)

## 2021-02-01 NOTE — Telephone Encounter (Signed)
Submitted PA information for generic Westcort cream to Cover my meds - awaiting review

## 2021-02-05 NOTE — Progress Notes (Signed)
[facility]  [phone] [phone]    ID: Andre [name]  DOB: 10-Sep-1951  MR#: [id number] CSN#: [id number]   Patient Care Team: Cassandria Anger, MD as PCP - General (Internal Medicine) Corrina Steffensen, [name], MD as Consulting Physician (Oncology) Concepcion Living, MD as Consulting Physician (Neurology) [name], MD as Consulting Physician (Internal Medicine) Kathrynn Ducking, MD as Consulting Physician (Neurology) Dorna Bloom, DDS as Referring Physician (Oral Surgery) OTHERS: Augustina Mood DDS   CHIEF COMPLAINT: Multiple myeloma  CURRENT TREATMENT: bortezomib/daratumumab/dexamethasone   INTERVAL HISTORY:  Andre [name] returns today for follow-up and treatment of his multiple myeloma.   He continues on bortezomib, daratumumab, dexamethasone in preparation for eventual CarTcell treatment.[name]  He is tolerating this with no side effects that he is aware of.  In particular he denies any peripheral neuropathy symptoms.  Today is day one of his third cycle.  He also continues on zolendronate, which he received 12/06/2020.  He tolerates this well except it makes him a bit confused for a few days, possibly due to calcium fluxes.  Lab Results  Component Value Date   TOTALPROTELP 6.0 01/25/2021   ALBUMINELP 3.5 01/25/2021   A1GS 0.2 01/25/2021   A2GS 0.7 01/25/2021   BETS 1.0 01/25/2021   BETA2SER 0.4 05/11/2015   GAMS 0.7 01/25/2021   MSPIKE 0.4 (H) 01/25/2021   SPEI Comment 01/25/2021   Lab Results  Component Value Date   KPAFRELGTCHN 8.1 01/25/2021   LAMBDASER 24.4 01/25/2021   KAPLAMBRATIO 0.33 01/25/2021    REVIEW OF SYSTEMS: Andre [name] tells me his wife went to [place] for a weekend to visit her son and he had some indiscretions namely ate some raw fish.  This upset his stomach but that now has resolved.  He continues to walk 1 or 2 miles most days, and also does some work at [employer].  A detailed review of systems today  was otherwise stable   COVID 19 VACCINATION STATUS: [place] x2, most recently 11/2019   HISTORY OF PRESENT ILLNESS:  From the original intake nodes:  Andre [name] was worked up for peptic ulcer disease in August of 2011, with significant bleeding and anemia.  The patient was Helicobacter pylori negative.  He received some epinephrine when he had his EGD and then started on Protonix.  [name] anemia slowly resolved so that by September, his hemoglobin was up to 10.9 and by earlier this month, his hemoglobin was up to 12.5.    As part of his general workup, he was found to have a slightly elevated globulin fraction.  In September, his total protein was 8.3 with an albumin of 3.8.  In January, the total protein was 8.4 and albumin 3.6.  With persistence of this slight abnormality, Dr. Olevia Perches obtained serum immunofixation and SPEP.  The SPTP showed an M-spike of 2.67 grams.  A total IgG was 4,190.  Total IgA low at 28.  Total IgM low at 28 also.  The immunofixation showed a monoclonal IgG lambda paraprotein.  There were also monoclonal free lambda light chains present. With this information, the patient was referred for further evaluation.   A diagnosis of myeloma was confirmed by bone marrow biopsy and subsequebnt treatment is as detailed below   PAST MEDICAL HISTORY: Past Medical History:  Diagnosis Date  . Allergy   . Anemia   . Arthritis   . Asthma    no treatment x 20 years  . Depression   . Double  vision    occurs at times   . Duodenal ulcer   . GERD (gastroesophageal reflux disease)   . Hyperlipidemia   . Hypertension   . Hypothyroidism   . Multiple myeloma 07/04/2011  . Thyroid disease   Significant for peptic ulcer disease as noted above. History of anxiety and depression.  History of GERD which is significantly improved with weight loss and history of reactive airway disease, possibly secondary to the GERD which also improved with weight loss.  There was a history of  obesity, now much improved secondary to exercise and diet.    PAST SURGICAL HISTORY: Past Surgical History:  Procedure Laterality Date  . BONE MARROW TRANSPLANT  2011   for MM  . CARDIOLITE STUDY  11/25/2003   NORMAL  . TONSILLECTOMY      FAMILY HISTORY: Family History  Problem Relation Age of Onset  . Throat cancer Mother   . Hypertension Father   . Stroke Father   . Asthma Father   . Diabetes Father   . Pancreatic cancer Brother   [name] father died at the age of 42 following a stroke.  [name] mother died with cancer of the throat at the age of 33.   She had a history of depression and was a smoker.  Not clear how much alcohol she drank, according to the patient.  The patient had two brothers.  One died from the age of 61 from a "kidney problem."  The second one died at the age of 76 from pancreatic cancer. (This may have been duodenal cancer. 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  His wife of 37+ years was Andre [name], but they divorced. In 2016 he remarried, his current wife's name is [name].  The patient has two daughters:  [name], who works in [place], who had her second child April 2016. Both daughters live here in [place].  [place] first daughter, [name], born 03/06/2013, apparently has [place]/Os[place]/Rendu. The patient is not a church attender.  Andre [name] also participates in [place] even though actually there is no alcohol or drug problem in the family.     ADVANCED DIRECTIVES: In place   HEALTH MAINTENANCE:  Social History   Tobacco Use  . Smoking status: Former Smoker    Years: 3.00    Quit date: 03/27/1969    Years since quitting: 51.9  . Smokeless tobacco: Never Used  Substance Use Topics  . Alcohol use: No  . Drug use: No    Colonoscopy: January  2015  PSA: December 2018, normal/Dr. [name]  Lipid panel: per Dr. Alain Marion  Allergies  Allergen Reactions  . Atorvastatin Other (See Comments)  . Rosuvastatin     Other reaction(s): Liver Disorder  . Crestor [Rosuvastatin Calcium]     ADVERSE EFFECTS ON LIVER  . Lipitor [Atorvastatin Calcium]   . Septra [Sulfamethoxazole-Trimethoprim] Rash   Current Outpatient Medications  Medication Sig Dispense Refill  . calcium-vitamin D (OSCAL WITH D) 500-200 MG-UNIT tablet Take 1 tablet by mouth daily.    . cetirizine (ZYRTEC) 10 MG tablet Take 10 mg by mouth daily.    . Cholecalciferol (VITAMIN D3) 2000 units capsule Take 1 capsule (2,000 Units total) by mouth daily. 100 capsule 3  . dexamethasone (DECADRON) 4 MG tablet Take 2.5 tablets (10 mg  total) by mouth once a week. Take the day after chemotherapy. 30 tablet 3  . diphenhydrAMINE (BENADRYL) 25 mg capsule Take 50 mg by mouth every 6 (six) hours as needed. Takes 2 benadryl at bedtime for itching    . docusate sodium (COLACE) 100 MG capsule Take 100 mg by mouth daily as needed for mild constipation or moderate constipation.    [name] escitalopram (LEXAPRO) 10 MG tablet Take 1 tablet (10 mg total) by mouth daily. 90 tablet 3  . HYDROCORTISONE ACE, RECTAL, 30 MG SUPP 1 pr bid x 7 d prn 14 suppository 3  . hydrocortisone valerate cream (WESTCORT) 0.2 % Apply 1 application topically 2 (two) times daily. 60 g 2  . levothyroxine (SYNTHROID) 150 MCG tablet TAKE 1 TABLET BY MOUTH EVERY MORNING ON AN EMPTY STOMACH 90 tablet 3  . LORazepam (ATIVAN) 0.5 MG tablet Take 1 tablet (0.5 mg total) by mouth at bedtime as needed for anxiety. 20 tablet 0  . ondansetron (ZOFRAN) 8 MG tablet TAKE 1 TABLET(8 MG) BY MOUTH EVERY 8 HOURS AS NEEDED FOR NAUSEA OR VOMITING 20 tablet 3  . pantoprazole (PROTONIX) 40 MG tablet TAKE 1 TABLET BY MOUTH TWICE DAILY BEFORE MEALS 180 tablet 3  . prochlorperazine (COMPAZINE) 10 MG tablet Take 1 tablet (10 mg total) by mouth every 6  (six) hours as needed (Nausea or vomiting). 30 tablet 1  . triamcinolone ointment (KENALOG) 0.1 % Apply 1 application topically 2 (two) times daily. 80 g 2  . valACYclovir (VALTREX) 1000 MG tablet TAKE 1 TABLET(1000 MG) BY MOUTH DAILY 90 tablet 3  . zolendronic acid (ZOMETA) 4 MG/5ML injection Inject 4 mg into the vein every 3 (three) months.     No current facility-administered medications for this visit.   Objective: white [name] in no acute distress Vitals:   02/07/21 1129  BP: 111/62  Pulse: 93  Temp: 97.9 F (36.6 C)  SpO2: 98%   Wt Readings from Last 3 Encounters:  02/07/21 181 lb 6.4 oz (82.3 kg)  01/17/21 180 lb 12.8 oz (82 kg)  01/10/21 180 lb 12 oz (82 kg)   Body mass index is 26.79 kg/m.     ECOG FS:1 - Symptomatic but completely ambulatory  Sclerae unicteric, EOMs intact Wearing a mask No cervical or supraclavicular adenopathy Lungs no rales or rhonchi Heart regular rate and rhythm Abd soft, nontender, positive bowel sounds MSK no focal spinal tenderness, no upper extremity lymphedema Neuro: nonfocal, well oriented, appropriate affect   LAB RESULTS:  Appointment on 02/07/2021  Component Date Value Ref Range Status  . WBC Count 02/07/2021 6.8  4.0 - 10.5 K/uL Final  . RBC 02/07/2021 3.18* 4.22 - 5.81 MIL/uL Final  . Hemoglobin 02/07/2021 11.0* 13.0 - 17.0 g/dL Final  . HCT 02/07/2021 31.0* 39.0 - 52.0 % Final  . MCV 02/07/2021 97.5  80.0 - 100.0 fL Final  . MCH 02/07/2021 34.6* 26.0 - 34.0 pg Final  . MCHC 02/07/2021 35.5  30.0 - 36.0 g/dL Final  . RDW 02/07/2021 17.0* 11.5 - 15.5 % Final  . Platelet Count 02/07/2021 110* 150 - 400 K/uL Final  . nRBC 02/07/2021 0.0  0.0 - 0.2 % Final  . Neutrophils Relative % 02/07/2021 75  % Final  . Neutro Abs 02/07/2021 5.1  1.7 - 7.7 K/uL Final  . Lymphocytes Relative 02/07/2021 14  % Final  . Lymphs Abs 02/07/2021 0.9  0.7 - 4.0 K/uL Final  . Monocytes Relative 02/07/2021 10  % Final  .  Monocytes Absolute  02/07/2021 0.7  0.1 - 1.0 K/uL Final  . Eosinophils Relative 02/07/2021 1  % Final  . Eosinophils Absolute 02/07/2021 0.1  0.0 - 0.5 K/uL Final  . Basophils Relative 02/07/2021 0  % Final  . Basophils Absolute 02/07/2021 0.0  0.0 - 0.1 K/uL Final  . Immature Granulocytes 02/07/2021 0  % Final  . Abs Immature Granulocytes 02/07/2021 0.02  0.00 - 0.07 K/uL Final   Performed at [facility], [place] [address] [place], [place] [address]    STUDIES: No results found.    ASSESSMENT: 70 y.o. Andre [name] with a history of multiple myeloma diagnosed February of 2012, with an initial M-spike of 2.6 g/dL, IFE showing an IgG lambda paraprotein and free lambda light chains in the urine. Cytogenetics showed trisomy 47. Bone marrow biopsy showed a 22% plasmacytosis. Treated with   (1) bortezomib (subcutaneously), lenalidomide, and dexamethasone, with repeat bone marrow biopsy May of 2012 showing 10% plasmacytosis   (2) high-dose chemotherapy with BCNU and melphalan at [facility], followed by stem cell rescue July of 2012   (3) on zoledronic acid started December of 2012, initially monthly, currently given every 3 months, most recent dose  12/07/2015  (4) low-dose lenalidomide resumed April 2013, interrupted several times.  Resumed again on 02/19/2013, ata dose of 5 mg daily, 21 days on and 7 days off, later further reduced to 7 days on, 7 days off  (5) CNS symptoms and abnormal brain MRI extensively evaluated by neurology with no definitive diagnosis established  (6) rising M spike noted June 2014 but did not meet criteria for carfilzomib study  (7) on lenalidomide 25 mg daily, 14 days on, 7 days off, starting 04/18/2013, interrupted December 2014 because of rash;   (a) resumed January 2015 at 10 mg/ day at 21 days on/ 7 days off  (Theadore) starting 08/18/2014 decreased to 10 mg/ day 14 days on, 7 days off because of cytopenias  (c) as of February 2016 was on 5 mg daily 7 days on 7 days  off  (d) lenalidomide discontinued December 2016 with evidence of disease progression  (8) transient global amnesia 05/29/2015, resolved without intervention  (9) starting PVD 10/25/2015 w ASA 325 thromboprophylaxis, valacyclovir prophylaxis, last dose 12/17/2015  (a) pomalidomide 4 mg/d days 1-14  ([place]) bortezomib sQ days 2,5,9,12 of each 21 day cycle  (c) dexamethasome 20 mg two days a week  (d) dexamethasone bortezomib and pomalidomide discontinued late December 2018 with poor tolerance  (10) metapneumovirus pneumonia April 2017  (a) completing course of steroids and week of bactrim mid April 2017  (11) status post second autologous transplant at [facility] 02/04/2016(preparatory regimen melphalan 200 mg/m)  (a) received twelve-month vaccinations 03/14/2017 (DPT, Haemophilus, Pneumovax 13, polio)  (Raeshaun) 14 months injections 05/04/2016 include DTaP, Hib conjugate, HepB energex Aric 20 mcg/ml, Prevnar 13  (c) 24 month vaccines due at [facility] June 2019  (12) maintenance therapy started November 2017, consisting of  (a) bortezomib 1.3 mg/M2 every 14 days, first dose 07/27/2016  (Yeriel) pomalidomide 1 mg days 1-21 Q28 days, started 07/19/2016  (c) zolendronate monthly started 07/27/2016 (previously Q 3 months) however patient unable to tolerate, and changed back to q3 months in April, 2018   (d) Bortezomib changed to monthly as of June 2018 because of tolerance issues, however discontinued after 09/14/2017 dose because of a rise in his M spike  (e) pomalidomide held after 10/18/2017 in preparation for possible study at [facility] (venetoclax)--never resumed  (f)  with numbers actually improved off treatment, resumed every 2-week bortezomib 12/25/2017  (g) changed to every 3-week bortezomib as of 03/17/2019  (h) briefly on weekly treatments times 22 October 2020 due to increase in M spike  (I) maintenance therapy discontinued with evidence of progression  (13) bortezomib/daratumumab/dexamethasone started  12/20/2020.  (a) Decadron dose dropped to 10 mg day of and day following treatment starting May 2022   PLAN:  Andre [name] is just over 10 years out from initial diagnosis of multiple myeloma and 5 years out from his second autologous transplant at [facility].  He progressed through maintenance and is now responding to bortezomib/daratumumab/dexamethasone.  I think he will be back in remission within another 2 months.  He was supposed to see Dr. Alvie Heidelberg at [facility] in June.  I think it would be better to wait until August for him to see ear and at that point they can decide if and when he is going to participate in the car T-cell program at there as she suggested  Total encounter time 25 minutes.*   Valory Wetherby, [name], MD  02/07/21 11:47 AM Medical Oncology and Hematology [facility] [place], [place] [address] Tel. [phone]    Fax. [phone]    I, Wilburn Mylar, am acting as scribe for Dr. [name]. Chaddrick Brue.  I, Lurline Del MD, have reviewed the above documentation for accuracy and completeness, and I agree with the above.   *Total Encounter Time as defined by the Centers for Medicare and Medicaid Services includes, in addition to the face-to-face time of a patient visit (documented in the note above) non-face-to-face time: obtaining and reviewing outside history, ordering and reviewing medications, tests or procedures, care coordination (communications with other health care professionals or caregivers) and documentation in the medical record.

## 2021-02-07 ENCOUNTER — Other Ambulatory Visit: Payer: Self-pay

## 2021-02-07 ENCOUNTER — Inpatient Hospital Stay: Payer: Medicare Other

## 2021-02-07 ENCOUNTER — Inpatient Hospital Stay (HOSPITAL_BASED_OUTPATIENT_CLINIC_OR_DEPARTMENT_OTHER): Payer: Medicare Other | Admitting: Oncology

## 2021-02-07 VITALS — BP 111/62 | HR 93 | Temp 97.9°F | Ht 69.0 in | Wt 181.4 lb

## 2021-02-07 DIAGNOSIS — G620 Drug-induced polyneuropathy: Secondary | ICD-10-CM

## 2021-02-07 DIAGNOSIS — T451X5A Adverse effect of antineoplastic and immunosuppressive drugs, initial encounter: Secondary | ICD-10-CM

## 2021-02-07 DIAGNOSIS — Z9481 Bone marrow transplant status: Secondary | ICD-10-CM

## 2021-02-07 DIAGNOSIS — C9000 Multiple myeloma not having achieved remission: Secondary | ICD-10-CM

## 2021-02-07 DIAGNOSIS — R9089 Other abnormal findings on diagnostic imaging of central nervous system: Secondary | ICD-10-CM

## 2021-02-07 DIAGNOSIS — Z5112 Encounter for antineoplastic immunotherapy: Secondary | ICD-10-CM | POA: Diagnosis not present

## 2021-02-07 DIAGNOSIS — Z7189 Other specified counseling: Secondary | ICD-10-CM | POA: Diagnosis not present

## 2021-02-07 LAB — CBC WITH DIFFERENTIAL (CANCER CENTER ONLY)
Abs Immature Granulocytes: 0.02 10*3/uL (ref 0.00–0.07)
Basophils Absolute: 0 10*3/uL (ref 0.0–0.1)
Basophils Relative: 0 %
Eosinophils Absolute: 0.1 10*3/uL (ref 0.0–0.5)
Eosinophils Relative: 1 %
HCT: 31 % — ABNORMAL LOW (ref 39.0–52.0)
Hemoglobin: 11 g/dL — ABNORMAL LOW (ref 13.0–17.0)
Immature Granulocytes: 0 %
Lymphocytes Relative: 14 %
Lymphs Abs: 0.9 10*3/uL (ref 0.7–4.0)
MCH: 34.6 pg — ABNORMAL HIGH (ref 26.0–34.0)
MCHC: 35.5 g/dL (ref 30.0–36.0)
MCV: 97.5 fL (ref 80.0–100.0)
Monocytes Absolute: 0.7 10*3/uL (ref 0.1–1.0)
Monocytes Relative: 10 %
Neutro Abs: 5.1 10*3/uL (ref 1.7–7.7)
Neutrophils Relative %: 75 %
Platelet Count: 110 10*3/uL — ABNORMAL LOW (ref 150–400)
RBC: 3.18 MIL/uL — ABNORMAL LOW (ref 4.22–5.81)
RDW: 17 % — ABNORMAL HIGH (ref 11.5–15.5)
WBC Count: 6.8 10*3/uL (ref 4.0–10.5)
nRBC: 0 % (ref 0.0–0.2)

## 2021-02-07 LAB — CMP (CANCER CENTER ONLY)
ALT: 15 U/L (ref 0–44)
AST: 21 U/L (ref 15–41)
Albumin: 3.5 g/dL (ref 3.5–5.0)
Alkaline Phosphatase: 50 U/L (ref 38–126)
Anion gap: 7 (ref 5–15)
BUN: 30 mg/dL — ABNORMAL HIGH (ref 8–23)
CO2: 23 mmol/L (ref 22–32)
Calcium: 9.2 mg/dL (ref 8.9–10.3)
Chloride: 102 mmol/L (ref 98–111)
Creatinine: 1.13 mg/dL (ref 0.61–1.24)
GFR, Estimated: >60 mL/min (ref >60)
Glucose, Bld: 106 mg/dL — ABNORMAL HIGH (ref 70–99)
Potassium: 4.3 mmol/L (ref 3.5–5.1)
Sodium: 132 mmol/L — ABNORMAL LOW (ref 135–145)
Total Bilirubin: 0.3 mg/dL (ref 0.3–1.2)
Total Protein: 6.7 g/dL (ref 6.5–8.1)

## 2021-02-07 MED ORDER — BORTEZOMIB CHEMO SQ INJECTION 3.5 MG (2.5MG/ML)
1.3000 mg/m2 | Freq: Once | INTRAMUSCULAR | Status: AC
  Administered 2021-02-07: 2.5 mg via SUBCUTANEOUS
  Filled 2021-02-07: qty 1

## 2021-02-07 MED ORDER — DARATUMUMAB-HYALURONIDASE-FIHJ 1800-30000 MG-UT/15ML ~~LOC~~ SOLN
1800.0000 mg | Freq: Once | SUBCUTANEOUS | Status: AC
  Administered 2021-02-07: 1800 mg via SUBCUTANEOUS
  Filled 2021-02-07: qty 15

## 2021-02-07 MED ORDER — ACETAMINOPHEN 325 MG PO TABS
ORAL_TABLET | ORAL | Status: AC
  Filled 2021-02-07: qty 2

## 2021-02-07 MED ORDER — DEXAMETHASONE 4 MG PO TABS
10.0000 mg | ORAL_TABLET | Freq: Once | ORAL | Status: AC
  Administered 2021-02-07: 10 mg via ORAL

## 2021-02-07 MED ORDER — DIPHENHYDRAMINE HCL 25 MG PO CAPS
25.0000 mg | ORAL_CAPSULE | Freq: Once | ORAL | Status: AC
  Administered 2021-02-07: 25 mg via ORAL

## 2021-02-07 MED ORDER — DIPHENHYDRAMINE HCL 25 MG PO CAPS
ORAL_CAPSULE | ORAL | Status: AC
  Filled 2021-02-07: qty 1

## 2021-02-07 MED ORDER — DEXAMETHASONE 4 MG PO TABS
ORAL_TABLET | ORAL | Status: AC
  Filled 2021-02-07: qty 3

## 2021-02-07 MED ORDER — ACETAMINOPHEN 325 MG PO TABS
650.0000 mg | ORAL_TABLET | Freq: Once | ORAL | Status: AC
Start: 2021-02-07 — End: 2021-02-07
  Administered 2021-02-07: 650 mg via ORAL

## 2021-02-07 NOTE — Patient Instructions (Signed)
[facility] ONCOLOGY  Discharge Instructions: Thank you for choosing [facility] to provide your oncology and hematology care.   If you have a lab appointment with the [facility], please go directly to the [facility] and check in at the registration area.   Wear comfortable clothing and clothing appropriate for easy access to any Portacath or PICC line.   We strive to give you quality time with your provider. You may need to reschedule your appointment if you arrive late (15 or more minutes).  Arriving late affects you and other patients whose appointments are after yours.  Also, if you miss three or more appointments without notifying the office, you may be dismissed from the clinic at the provider's discretion.      For prescription refill requests, have your pharmacy contact our office and allow 72 hours for refills to be completed.    Today you received the following chemotherapy and/or immunotherapy agents boretizomib, daratummumab   To help prevent nausea and vomiting after your treatment, we encourage you to take your nausea medication as directed.  BELOW ARE SYMPTOMS THAT SHOULD BE REPORTED IMMEDIATELY: . *FEVER GREATER THAN 100.4 F (38 C) OR HIGHER . *CHILLS OR SWEATING . *NAUSEA AND VOMITING THAT IS NOT CONTROLLED WITH YOUR NAUSEA MEDICATION . *UNUSUAL SHORTNESS OF BREATH . *UNUSUAL BRUISING OR BLEEDING . *URINARY PROBLEMS (pain or burning when urinating, or frequent urination) . *BOWEL PROBLEMS (unusual diarrhea, constipation, pain near the anus) . TENDERNESS IN MOUTH AND THROAT WITH OR WITHOUT PRESENCE OF ULCERS (sore throat, sores in mouth, or a toothache) . UNUSUAL RASH, SWELLING OR PAIN  . UNUSUAL VAGINAL DISCHARGE OR ITCHING   Items with * indicate a potential emergency and should be followed up as soon as possible or go to the Emergency Department if any problems should occur.  Please show the CHEMOTHERAPY ALERT CARD or  IMMUNOTHERAPY ALERT CARD at check-in to the Emergency Department and triage nurse.  Should you have questions after your visit or need to cancel or reschedule your appointment, please contact [facility]  Dept: [phone]  and follow the prompts.  Office hours are 8:00 a.m. to 4:30 p.m. Monday - Friday. Please note that voicemails left after 4:00 p.m. may not be returned until the following business day.  We are closed weekends and major holidays. You have access to a nurse at all times for urgent questions. Please call the main number to the clinic Dept: [phone] and follow the prompts.   For any non-urgent questions, you may also contact your provider using MyChart. We now offer e-Visits for anyone 20 and older to request care online for non-urgent symptoms. For details visit [email or web address].   Also download the MyChart app! Go to the app store, search "MyChart", open the app, select [facility], and log in with your MyChart username and password.  Due to Covid, a mask is required upon entering the hospital/clinic. If you do not have a mask, one will be given to you upon arrival. For doctor visits, patients may have 1 support person aged 19 or older with them. For treatment visits, patients cannot have anyone with them due to current Covid guidelines and our immunocompromised population.

## 2021-02-08 LAB — IGG: IgG (Immunoglobin G), Serum: 741 mg/dL (ref 603–1613)

## 2021-02-09 ENCOUNTER — Encounter: Payer: Self-pay | Admitting: Oncology

## 2021-02-15 ENCOUNTER — Other Ambulatory Visit: Payer: Self-pay | Admitting: *Deleted

## 2021-02-15 ENCOUNTER — Other Ambulatory Visit: Payer: Medicare Other

## 2021-02-15 ENCOUNTER — Ambulatory Visit: Payer: Medicare Other

## 2021-02-15 DIAGNOSIS — C9000 Multiple myeloma not having achieved remission: Secondary | ICD-10-CM

## 2021-02-16 ENCOUNTER — Telehealth: Payer: Self-pay | Admitting: *Deleted

## 2021-02-16 ENCOUNTER — Inpatient Hospital Stay: Payer: Medicare Other | Attending: Oncology

## 2021-02-16 ENCOUNTER — Inpatient Hospital Stay: Payer: Medicare Other

## 2021-02-16 ENCOUNTER — Other Ambulatory Visit: Payer: Self-pay

## 2021-02-16 VITALS — BP 134/74 | HR 66 | Temp 97.9°F | Resp 18 | Ht 69.0 in | Wt 182.8 lb

## 2021-02-16 DIAGNOSIS — T451X5A Adverse effect of antineoplastic and immunosuppressive drugs, initial encounter: Secondary | ICD-10-CM

## 2021-02-16 DIAGNOSIS — Z9481 Bone marrow transplant status: Secondary | ICD-10-CM

## 2021-02-16 DIAGNOSIS — Z5112 Encounter for antineoplastic immunotherapy: Secondary | ICD-10-CM | POA: Diagnosis present

## 2021-02-16 DIAGNOSIS — Z79899 Other long term (current) drug therapy: Secondary | ICD-10-CM | POA: Insufficient documentation

## 2021-02-16 DIAGNOSIS — C9000 Multiple myeloma not having achieved remission: Secondary | ICD-10-CM | POA: Insufficient documentation

## 2021-02-16 DIAGNOSIS — G620 Drug-induced polyneuropathy: Secondary | ICD-10-CM

## 2021-02-16 DIAGNOSIS — R9089 Other abnormal findings on diagnostic imaging of central nervous system: Secondary | ICD-10-CM

## 2021-02-16 LAB — CBC WITH DIFFERENTIAL (CANCER CENTER ONLY)
Abs Immature Granulocytes: 0.02 10*3/uL (ref 0.00–0.07)
Basophils Absolute: 0 10*3/uL (ref 0.0–0.1)
Basophils Relative: 0 %
Eosinophils Absolute: 0.1 10*3/uL (ref 0.0–0.5)
Eosinophils Relative: 2 %
HCT: 30.8 % — ABNORMAL LOW (ref 39.0–52.0)
Hemoglobin: 10.5 g/dL — ABNORMAL LOW (ref 13.0–17.0)
Immature Granulocytes: 0 %
Lymphocytes Relative: 16 %
Lymphs Abs: 1 10*3/uL (ref 0.7–4.0)
MCH: 34.2 pg — ABNORMAL HIGH (ref 26.0–34.0)
MCHC: 34.1 g/dL (ref 30.0–36.0)
MCV: 100.3 fL — ABNORMAL HIGH (ref 80.0–100.0)
Monocytes Absolute: 0.8 10*3/uL (ref 0.1–1.0)
Monocytes Relative: 13 %
Neutro Abs: 4.3 10*3/uL (ref 1.7–7.7)
Neutrophils Relative %: 69 %
Platelet Count: 92 10*3/uL — ABNORMAL LOW (ref 150–400)
RBC: 3.07 MIL/uL — ABNORMAL LOW (ref 4.22–5.81)
RDW: 17.3 % — ABNORMAL HIGH (ref 11.5–15.5)
WBC Count: 6.3 10*3/uL (ref 4.0–10.5)
nRBC: 0 % (ref 0.0–0.2)

## 2021-02-16 LAB — CMP (CANCER CENTER ONLY)
ALT: 14 U/L (ref 0–44)
AST: 20 U/L (ref 15–41)
Albumin: 3.5 g/dL (ref 3.5–5.0)
Alkaline Phosphatase: 53 U/L (ref 38–126)
Anion gap: 11 (ref 5–15)
BUN: 31 mg/dL — ABNORMAL HIGH (ref 8–23)
CO2: 22 mmol/L (ref 22–32)
Calcium: 9.3 mg/dL (ref 8.9–10.3)
Chloride: 102 mmol/L (ref 98–111)
Creatinine: 1.09 mg/dL (ref 0.61–1.24)
GFR, Estimated: >60 mL/min (ref >60)
Glucose, Bld: 99 mg/dL (ref 70–99)
Potassium: 4.4 mmol/L (ref 3.5–5.1)
Sodium: 135 mmol/L (ref 135–145)
Total Bilirubin: 0.3 mg/dL (ref 0.3–1.2)
Total Protein: 6.6 g/dL (ref 6.5–8.1)

## 2021-02-16 MED ORDER — DARATUMUMAB-HYALURONIDASE-FIHJ 1800-30000 MG-UT/15ML ~~LOC~~ SOLN
1800.0000 mg | Freq: Once | SUBCUTANEOUS | Status: AC
  Administered 2021-02-16: 1800 mg via SUBCUTANEOUS
  Filled 2021-02-16: qty 15

## 2021-02-16 MED ORDER — DIPHENHYDRAMINE HCL 25 MG PO CAPS
ORAL_CAPSULE | ORAL | Status: AC
  Filled 2021-02-16: qty 1

## 2021-02-16 MED ORDER — DEXAMETHASONE 4 MG PO TABS
10.0000 mg | ORAL_TABLET | Freq: Once | ORAL | Status: AC
  Administered 2021-02-16: 10 mg via ORAL

## 2021-02-16 MED ORDER — ACETAMINOPHEN 325 MG PO TABS
650.0000 mg | ORAL_TABLET | Freq: Once | ORAL | Status: AC
  Administered 2021-02-16: 650 mg via ORAL

## 2021-02-16 MED ORDER — DIPHENHYDRAMINE HCL 25 MG PO CAPS
25.0000 mg | ORAL_CAPSULE | Freq: Once | ORAL | Status: AC
  Administered 2021-02-16: 25 mg via ORAL

## 2021-02-16 MED ORDER — ACETAMINOPHEN 325 MG PO TABS
ORAL_TABLET | ORAL | Status: AC
  Filled 2021-02-16: qty 2

## 2021-02-16 MED ORDER — DEXAMETHASONE 4 MG PO TABS
ORAL_TABLET | ORAL | Status: AC
  Filled 2021-02-16: qty 3

## 2021-02-16 MED ORDER — BORTEZOMIB CHEMO SQ INJECTION 3.5 MG (2.5MG/ML)
1.3000 mg/m2 | Freq: Once | INTRAMUSCULAR | Status: AC
  Administered 2021-02-16: 2.5 mg via SUBCUTANEOUS
  Filled 2021-02-16: qty 1

## 2021-02-16 NOTE — Telephone Encounter (Signed)
Ok to treat with plts of 92,000 per MD review.

## 2021-02-16 NOTE — Patient Instructions (Signed)
[name]   Discharge Instructions: Thank you for choosing [facility] to provide your oncology and hematology care.   If you have a lab appointment with the [facility], please go directly to the [facility] and check in at the registration area.   Wear comfortable clothing and clothing appropriate for easy access to any Portacath or PICC line.   We strive to give you quality time with your provider. You may need to reschedule your appointment if you arrive late (15 or more minutes).  Arriving late affects you and other patients whose appointments are after yours.  Also, if you miss three or more appointments without notifying the office, you may be dismissed from the clinic at the provider's discretion.      For prescription refill requests, have your pharmacy contact our office and allow 72 hours for refills to be completed.    Today you received the following chemotherapy and/or immunotherapy agents: Bortezomib (Velcade) and Daratumumab-hyaluronidase (Darzalex Faspro)   To help prevent nausea and vomiting after your treatment, we encourage you to take your nausea medication as directed.  BELOW ARE SYMPTOMS THAT SHOULD BE REPORTED IMMEDIATELY: . *FEVER GREATER THAN 100.4 F (38 C) OR HIGHER . *CHILLS OR SWEATING . *NAUSEA AND VOMITING THAT IS NOT CONTROLLED WITH YOUR NAUSEA MEDICATION . *UNUSUAL SHORTNESS OF BREATH . *UNUSUAL BRUISING OR BLEEDING . *URINARY PROBLEMS (pain or burning when urinating, or frequent urination) . *BOWEL PROBLEMS (unusual diarrhea, constipation, pain near the anus) . TENDERNESS IN MOUTH AND THROAT WITH OR WITHOUT PRESENCE OF ULCERS (sore throat, sores in mouth, or a toothache) . UNUSUAL RASH, SWELLING OR PAIN  . UNUSUAL VAGINAL DISCHARGE OR ITCHING   Items with * indicate a potential emergency and should be followed up as soon as possible or go to the Emergency Department if any problems should  occur.  Please show the CHEMOTHERAPY ALERT CARD or IMMUNOTHERAPY ALERT CARD at check-in to the Emergency Department and triage nurse.  Should you have questions after your visit or need to cancel or reschedule your appointment, please contact [facility]  Dept: [phone]  and follow the prompts.  Office hours are 8:00 a.m. to 4:30 p.m. Monday - Friday. Please note that voicemails left after 4:00 p.m. may not be returned until the following business day.  We are closed weekends and major holidays. You have access to a nurse at all times for urgent questions. Please call the main number to the clinic Dept: [phone] and follow the prompts.   For any non-urgent questions, you may also contact your provider using MyChart. We now offer e-Visits for anyone 45 and older to request care online for non-urgent symptoms. For details visit [email or web address].   Also download the MyChart app! Go to the app store, search "MyChart", open the app, select [facility], and log in with your MyChart username and password.  Due to Covid, a mask is required upon entering the hospital/clinic. If you do not have a mask, one will be given to you upon arrival. For doctor visits, patients may have 1 support person aged 45 or older with them. For treatment visits, patients cannot have anyone with them due to current Covid guidelines and our immunocompromised population.

## 2021-02-17 LAB — PROTEIN ELECTROPHORESIS, SERUM
A/G Ratio: 1.4 (ref 0.7–1.7)
Albumin ELP: 3.4 g/dL (ref 2.9–4.4)
Alpha-1-Globulin: 0.2 g/dL (ref 0.0–0.4)
Alpha-2-Globulin: 0.7 g/dL (ref 0.4–1.0)
Beta Globulin: 1 g/dL (ref 0.7–1.3)
Gamma Globulin: 0.6 g/dL (ref 0.4–1.8)
Globulin, Total: 2.5 g/dL (ref 2.2–3.9)
M-Spike, %: 0.4 g/dL — ABNORMAL HIGH
Total Protein ELP: 5.9 g/dL — ABNORMAL LOW (ref 6.0–8.5)

## 2021-02-17 LAB — IGG: IgG (Immunoglobin G), Serum: 743 mg/dL (ref 603–1613)

## 2021-02-17 LAB — KAPPA/LAMBDA LIGHT CHAINS
Kappa free light chain: 7.8 mg/L (ref 3.3–19.4)
Kappa, lambda light chain ratio: 0.35 (ref 0.26–1.65)
Lambda free light chains: 22.4 mg/L (ref 5.7–26.3)

## 2021-02-18 ENCOUNTER — Ambulatory Visit (INDEPENDENT_AMBULATORY_CARE_PROVIDER_SITE_OTHER): Payer: Medicare Other | Admitting: Internal Medicine

## 2021-02-18 ENCOUNTER — Other Ambulatory Visit: Payer: Self-pay

## 2021-02-18 VITALS — BP 110/72 | HR 59 | Ht 69.0 in | Wt 182.0 lb

## 2021-02-18 DIAGNOSIS — S60221A Contusion of right hand, initial encounter: Secondary | ICD-10-CM | POA: Diagnosis not present

## 2021-02-18 DIAGNOSIS — E039 Hypothyroidism, unspecified: Secondary | ICD-10-CM

## 2021-02-18 DIAGNOSIS — S0511XA Contusion of eyeball and orbital tissues, right eye, initial encounter: Secondary | ICD-10-CM

## 2021-02-18 DIAGNOSIS — E559 Vitamin D deficiency, unspecified: Secondary | ICD-10-CM

## 2021-02-18 DIAGNOSIS — W19XXXA Unspecified fall, initial encounter: Secondary | ICD-10-CM

## 2021-02-18 NOTE — Progress Notes (Signed)
Patient ID: Andre [name], male   DOB: 1951/06/10, 70 y.o.   MRN: [id number]        Chief Complaint: right periorbital bruising and abrasions, low TSH and low vit d       HPI:  Andre [name] is a 70 y.o. male here with wife after accidental trip and fall on asphalt 3 days ago with striking right periorbital area and right hand, now with large bruising and abrasions; has no obvious functional abnormal and currently denies pain or vision change, though has some soreness to touch the areas bruised.  Had a similar fall a few years ago and seemed to heal up faster.  Wife has been meticulous with cleaning the abrasions around the eye but has a couple of small darker area wondering if gravel is still involved. Has been using topical triple antibiotic otc.  Denies worsening red, swelling or drainage.  Also, Denies hyper or hypo thyroid symptoms such as voice, skin or hair change, and taking Vit D, with last thyroid testing in late 2018.  Pt has been followed closely per oncology with numerous blood draws and pt very reluctant to have further on top of that.         Wt Readings from Last 3 Encounters:  02/18/21 182 lb (82.6 kg)  02/16/21 182 lb 12 oz (82.9 kg)  02/07/21 181 lb 6.4 oz (82.3 kg)   BP Readings from Last 3 Encounters:  02/18/21 110/72  02/16/21 134/74  02/07/21 111/62         Past Medical History:  Diagnosis Date  . Allergy   . Anemia   . Arthritis   . Asthma    no treatment x 20 years  . Depression   . Double vision    occurs at times   . Duodenal ulcer   . GERD (gastroesophageal reflux disease)   . Hyperlipidemia   . Hypertension   . Hypothyroidism   . Multiple myeloma 07/04/2011  . Thyroid disease    Past Surgical History:  Procedure Laterality Date  . BONE MARROW TRANSPLANT  2011   for MM  . CARDIOLITE STUDY  11/25/2003   NORMAL  . TONSILLECTOMY      reports that he quit smoking about 51 years ago. He quit after 3.00 years of use. He has never used smokeless  tobacco. He reports that he does not drink alcohol and does not use drugs. family history includes Asthma in his father; Diabetes in his father; Hypertension in his father; Pancreatic cancer in his brother; Stroke in his father; Throat cancer in his mother. Allergies  Allergen Reactions  . Atorvastatin Other (See Comments)  . Rosuvastatin     Other reaction(s): Liver Disorder  . Crestor [Rosuvastatin Calcium]     ADVERSE EFFECTS ON LIVER  . Lipitor [Atorvastatin Calcium]   . Septra [Sulfamethoxazole-Trimethoprim] Rash   Current Outpatient Medications on File Prior to Visit  Medication Sig Dispense Refill  . calcium-vitamin D (OSCAL WITH D) 500-200 MG-UNIT tablet Take 1 tablet by mouth daily.    . cetirizine (ZYRTEC) 10 MG tablet Take 10 mg by mouth daily.    . Cholecalciferol (VITAMIN D3) 2000 units capsule Take 1 capsule (2,000 Units total) by mouth daily. 100 capsule 3  . dexamethasone (DECADRON) 4 MG tablet Take 2.5 tablets (10 mg total) by mouth once a week. Take the day after chemotherapy. 30 tablet 3  . diphenhydrAMINE (BENADRYL) 25 mg capsule Take 50 mg by mouth every 6 (six) hours  as needed. Takes 2 benadryl at bedtime for itching    . docusate sodium (COLACE) 100 MG capsule Take 100 mg by mouth daily as needed for mild constipation or moderate constipation.    [name] escitalopram (LEXAPRO) 10 MG tablet Take 1 tablet (10 mg total) by mouth daily. 90 tablet 3  . HYDROCORTISONE ACE, RECTAL, 30 MG SUPP 1 pr bid x 7 d prn 14 suppository 3  . hydrocortisone valerate cream (WESTCORT) 0.2 % Apply 1 application topically 2 (two) times daily. 60 g 2  . levothyroxine (SYNTHROID) 150 MCG tablet TAKE 1 TABLET BY MOUTH EVERY MORNING ON AN EMPTY STOMACH 90 tablet 3  . LORazepam (ATIVAN) 0.5 MG tablet Take 1 tablet (0.5 mg total) by mouth at bedtime as needed for anxiety. 20 tablet 0  . ondansetron (ZOFRAN) 8 MG tablet TAKE 1 TABLET(8 MG) BY MOUTH EVERY 8 HOURS AS NEEDED FOR NAUSEA OR VOMITING 20 tablet  3  . pantoprazole (PROTONIX) 40 MG tablet TAKE 1 TABLET BY MOUTH TWICE DAILY BEFORE MEALS 180 tablet 3  . prochlorperazine (COMPAZINE) 10 MG tablet Take 1 tablet (10 mg total) by mouth every 6 (six) hours as needed (Nausea or vomiting). 30 tablet 1  . triamcinolone ointment (KENALOG) 0.1 % Apply 1 application topically 2 (two) times daily. 80 g 2  . valACYclovir (VALTREX) 1000 MG tablet TAKE 1 TABLET(1000 MG) BY MOUTH DAILY 90 tablet 3  . zolendronic acid (ZOMETA) 4 MG/5ML injection Inject 4 mg into the vein every 3 (three) months.     No current facility-administered medications on file prior to visit.        ROS:  All others reviewed and negative.  Objective        PE:  BP 110/72 (BP Location: Left Arm, Patient Position: Sitting, Cuff Size: Normal)   Pulse (!) 59   Ht 5' 9"  (1.753 m)   Wt 182 lb (82.6 kg)   SpO2 98%   BMI 26.88 kg/m                 Constitutional: Pt appears in NAD               HENT: Head: NCAT.                Right Ear: External ear normal.                 Left Ear: External ear normal.                Eyes: . Pupils are equal, round, and reactive to light. Conjunctivae and EOM are normal; right periorbital area with large bruising, mild abrasions supra area with some few clot/scabbing - no gravel noted and very clean otherwise and no evidence of evolving cellulitis               Nose: without d/c or deformity               Neck: Neck supple. Gross normal ROM               Cardiovascular: Normal rate and regular rhythm.                 Pulmonary/Chest: Effort normal and breath sounds without rales or wheezing.                Abd:  Soft, NT, ND, + BS, no organomegaly               Neurological:  Pt is alert. At baseline orientation, motor grossly intact               Skin: LE edema - none; right hand with large medial mid hand bruising and mild tender swelling without hematoma, abrasion, redness or drainage; no red streaks               Psychiatric: Pt behavior is  normal without agitation   Micro: none  Cardiac tracings I have personally interpreted today:  none  Pertinent Radiological findings (summarize): none   Lab Results  Component Value Date   WBC 6.3 02/16/2021   HGB 10.5 (L) 02/16/2021   HCT 30.8 (L) 02/16/2021   PLT 92 (L) 02/16/2021   GLUCOSE 99 02/16/2021   CHOL 164 09/06/2017   TRIG 194.0 (H) 09/06/2017   HDL 38.60 (L) 09/06/2017   LDLDIRECT 155.8 11/13/2012   LDLCALC 86 09/06/2017   ALT 14 02/16/2021   AST 20 02/16/2021   NA 135 02/16/2021   K 4.4 02/16/2021   CL 102 02/16/2021   CREATININE 1.09 02/16/2021   BUN 31 (H) 02/16/2021   CO2 22 02/16/2021   TSH 0.05 (L) 09/06/2017   PSA 1.76 09/06/2017   INR 1.07 02/27/2016   HGBA1C 5.4 03/27/2012   Assessment/Plan:  Andre [name] is a 70 y.o. White or Caucasian [1] male with  has a past medical history of Allergy, Anemia, Arthritis, Asthma, Depression, Double vision, Duodenal ulcer, GERD (gastroesophageal reflux disease), Hyperlipidemia, Hypertension, Hypothyroidism, Multiple myeloma (07/04/2011), and Thyroid disease.  Vitamin D deficiency Last vitamin D Lab Results  Component Value Date   VD25OH 19.54 (L) 09/06/2017   Low in 2018, to cont oral replacement, and ordered lab if pt is willing to have done  Hypothyroidism Lab Results  Component Value Date   TSH 0.05 (L) 09/06/2017   Abnormal TSH last checked 2018, pt to continue levothyroxine 150 for now, but I asked pt despite so many blood draws to have this checked at his convenience at the [facility] lab site   Accidental fall Appears to be accidental without recent overt worsening general debility, declines PT  Periorbital contusion of right eye Wife and pt reassured, very low suspicion for fracture given lack of pain and pt declines xray; bruising and abrasion without other complcations, and educated that resolution often takes 2-4 wks for most to resolve  Contusion of right hand Hand without worsening  neurovascular status, reassured, no need xray at this time,  to f/u any worsening symptoms or concerns   Followup: Return if symptoms worsen or fail to improve.  Cathlean Cower, MD 02/19/2021 2:52 PM [facility]

## 2021-02-18 NOTE — Patient Instructions (Signed)
Please continue all other medications as before, and refills have been done if requested.  Please have the pharmacy call with any other refills you may need.  Please continue your efforts at being more active, low cholesterol diet, and weight control.  Please keep your appointments with your specialists as you may have planned  Please go to the LAB at the blood drawing area for the tests to be done - at the [facility] lab at your convenience  You will be contacted by phone if any changes need to be made immediately.  Otherwise, you will receive a letter about your results with an explanation, but please check with MyChart first.  Please remember to sign up for MyChart if you have not done so, as this will be important to you in the future with finding out test results, communicating by private email, and scheduling acute appointments online when needed.

## 2021-02-19 ENCOUNTER — Encounter: Payer: Self-pay | Admitting: Internal Medicine

## 2021-02-19 DIAGNOSIS — S0511XA Contusion of eyeball and orbital tissues, right eye, initial encounter: Secondary | ICD-10-CM | POA: Insufficient documentation

## 2021-02-19 DIAGNOSIS — W19XXXA Unspecified fall, initial encounter: Secondary | ICD-10-CM | POA: Insufficient documentation

## 2021-02-19 DIAGNOSIS — S60221A Contusion of right hand, initial encounter: Secondary | ICD-10-CM | POA: Insufficient documentation

## 2021-02-19 NOTE — Assessment & Plan Note (Signed)
Hand without worsening neurovascular status, reassured, no need xray at this time,  to f/u any worsening symptoms or concerns

## 2021-02-19 NOTE — Assessment & Plan Note (Signed)
Appears to be accidental without recent overt worsening general debility, declines PT

## 2021-02-19 NOTE — Assessment & Plan Note (Signed)
Last vitamin D Lab Results  Component Value Date   VD25OH 19.54 (L) 09/06/2017   Low in 2018, to cont oral replacement, and ordered lab if pt is willing to have done

## 2021-02-19 NOTE — Assessment & Plan Note (Signed)
Lab Results  Component Value Date   TSH 0.05 (L) 09/06/2017   Abnormal TSH last checked 2018, pt to continue levothyroxine 150 for now, but I asked pt despite so many blood draws to have this checked at his convenience at the [facility] lab site

## 2021-02-19 NOTE — Assessment & Plan Note (Signed)
Wife and pt reassured, very low suspicion for fracture given lack of pain and pt declines xray; bruising and abrasion without other complcations, and educated that resolution often takes 2-4 wks for most to resolve

## 2021-02-21 ENCOUNTER — Other Ambulatory Visit: Payer: Self-pay | Admitting: *Deleted

## 2021-02-21 DIAGNOSIS — C9000 Multiple myeloma not having achieved remission: Secondary | ICD-10-CM

## 2021-02-22 ENCOUNTER — Inpatient Hospital Stay: Payer: Medicare Other

## 2021-02-22 ENCOUNTER — Other Ambulatory Visit: Payer: Self-pay

## 2021-02-22 ENCOUNTER — Other Ambulatory Visit: Payer: Medicare Other

## 2021-02-22 VITALS — BP 153/74 | HR 56 | Temp 98.2°F | Resp 16 | Wt 181.0 lb

## 2021-02-22 DIAGNOSIS — Z9481 Bone marrow transplant status: Secondary | ICD-10-CM

## 2021-02-22 DIAGNOSIS — R9089 Other abnormal findings on diagnostic imaging of central nervous system: Secondary | ICD-10-CM

## 2021-02-22 DIAGNOSIS — G620 Drug-induced polyneuropathy: Secondary | ICD-10-CM

## 2021-02-22 DIAGNOSIS — C9000 Multiple myeloma not having achieved remission: Secondary | ICD-10-CM

## 2021-02-22 DIAGNOSIS — Z5112 Encounter for antineoplastic immunotherapy: Secondary | ICD-10-CM | POA: Diagnosis not present

## 2021-02-22 DIAGNOSIS — T451X5A Adverse effect of antineoplastic and immunosuppressive drugs, initial encounter: Secondary | ICD-10-CM

## 2021-02-22 LAB — CBC WITH DIFFERENTIAL (CANCER CENTER ONLY)
Abs Immature Granulocytes: 0.02 10*3/uL (ref 0.00–0.07)
Basophils Absolute: 0 10*3/uL (ref 0.0–0.1)
Basophils Relative: 0 %
Eosinophils Absolute: 0.1 10*3/uL (ref 0.0–0.5)
Eosinophils Relative: 3 %
HCT: 34 % — ABNORMAL LOW (ref 39.0–52.0)
Hemoglobin: 11.3 g/dL — ABNORMAL LOW (ref 13.0–17.0)
Immature Granulocytes: 0 %
Lymphocytes Relative: 22 %
Lymphs Abs: 1.1 10*3/uL (ref 0.7–4.0)
MCH: 33.3 pg (ref 26.0–34.0)
MCHC: 33.2 g/dL (ref 30.0–36.0)
MCV: 100.3 fL — ABNORMAL HIGH (ref 80.0–100.0)
Monocytes Absolute: 0.7 10*3/uL (ref 0.1–1.0)
Monocytes Relative: 14 %
Neutro Abs: 3.1 10*3/uL (ref 1.7–7.7)
Neutrophils Relative %: 61 %
Platelet Count: 90 10*3/uL — ABNORMAL LOW (ref 150–400)
RBC: 3.39 MIL/uL — ABNORMAL LOW (ref 4.22–5.81)
RDW: 17.1 % — ABNORMAL HIGH (ref 11.5–15.5)
WBC Count: 5.1 10*3/uL (ref 4.0–10.5)
nRBC: 0 % (ref 0.0–0.2)

## 2021-02-22 MED ORDER — DIPHENHYDRAMINE HCL 25 MG PO CAPS
ORAL_CAPSULE | ORAL | Status: AC
  Filled 2021-02-22: qty 1

## 2021-02-22 MED ORDER — ACETAMINOPHEN 325 MG PO TABS
650.0000 mg | ORAL_TABLET | Freq: Once | ORAL | Status: AC
  Administered 2021-02-22: 650 mg via ORAL

## 2021-02-22 MED ORDER — DEXAMETHASONE 4 MG PO TABS
10.0000 mg | ORAL_TABLET | Freq: Once | ORAL | Status: AC
  Administered 2021-02-22: 10 mg via ORAL

## 2021-02-22 MED ORDER — ACETAMINOPHEN 325 MG PO TABS
ORAL_TABLET | ORAL | Status: AC
  Filled 2021-02-22: qty 2

## 2021-02-22 MED ORDER — DEXAMETHASONE 4 MG PO TABS
ORAL_TABLET | ORAL | Status: AC
  Filled 2021-02-22: qty 3

## 2021-02-22 MED ORDER — DARATUMUMAB-HYALURONIDASE-FIHJ 1800-30000 MG-UT/15ML ~~LOC~~ SOLN
1800.0000 mg | Freq: Once | SUBCUTANEOUS | Status: AC
  Administered 2021-02-22: 1800 mg via SUBCUTANEOUS
  Filled 2021-02-22: qty 15

## 2021-02-22 MED ORDER — DIPHENHYDRAMINE HCL 25 MG PO CAPS
25.0000 mg | ORAL_CAPSULE | Freq: Once | ORAL | Status: AC
  Administered 2021-02-22: 25 mg via ORAL

## 2021-02-22 NOTE — Patient Instructions (Signed)
[facility]  Discharge Instructions: Thank you for choosing [facility] to provide your oncology and hematology care.   If you have a lab appointment with the Cancer Center, please go directly to the Cancer Center and check in at the registration area.   Wear comfortable clothing and clothing appropriate for easy access to any Portacath or PICC line.   We strive to give you quality time with your provider. You may need to reschedule your appointment if you arrive late (15 or more minutes).  Arriving late affects you and other patients whose appointments are after yours.  Also, if you miss three or more appointments without notifying the office, you may be dismissed from the clinic at the provider's discretion.      For prescription refill requests, have your pharmacy contact our office and allow 72 hours for refills to be completed.    Today you received the following chemotherapy and/or immunotherapy agents: Darzalex Faspro      To help prevent nausea and vomiting after your treatment, we encourage you to take your nausea medication as directed.  BELOW ARE SYMPTOMS THAT SHOULD BE REPORTED IMMEDIATELY: *FEVER GREATER THAN 100.4 F (38 C) OR HIGHER *CHILLS OR SWEATING *NAUSEA AND VOMITING THAT IS NOT CONTROLLED WITH YOUR NAUSEA MEDICATION *UNUSUAL SHORTNESS OF BREATH *UNUSUAL BRUISING OR BLEEDING *URINARY PROBLEMS (pain or burning when urinating, or frequent urination) *BOWEL PROBLEMS (unusual diarrhea, constipation, pain near the anus) TENDERNESS IN MOUTH AND THROAT WITH OR WITHOUT PRESENCE OF ULCERS (sore throat, sores in mouth, or a toothache) UNUSUAL RASH, SWELLING OR PAIN  UNUSUAL VAGINAL DISCHARGE OR ITCHING   Items with * indicate a potential emergency and should be followed up as soon as possible or go to the Emergency Department if any problems should occur.  Please show the CHEMOTHERAPY ALERT CARD or IMMUNOTHERAPY ALERT CARD at  check-in to the Emergency Department and triage nurse.  Should you have questions after your visit or need to cancel or reschedule your appointment, please contact [facility]  Dept: [phone]  and follow the prompts.  Office hours are 8:00 a.m. to 4:30 p.m. Monday - Friday. Please note that voicemails left after 4:00 p.m. may not be returned until the following business day.  We are closed weekends and major holidays. You have access to a nurse at all times for urgent questions. Please call the main number to the clinic Dept: [phone] and follow the prompts.   For any non-urgent questions, you may also contact your provider using MyChart. We now offer e-Visits for anyone 18 and older to request care online for non-urgent symptoms. For details visit [email or web address].   Also download the MyChart app! Go to the app store, search "MyChart", open the app, select [facility], and log in with your MyChart username and password.  Due to Covid, a mask is required upon entering the hospital/clinic. If you do not have a mask, one will be given to you upon arrival. For doctor visits, patients may have 1 support person aged 18 or older with them. For treatment visits, patients cannot have anyone with them due to current Covid guidelines and our immunocompromised population.  

## 2021-02-22 NOTE — Progress Notes (Signed)
Per Dr. Alvy Bimler, okay to treat with PLT 90.

## 2021-02-23 ENCOUNTER — Ambulatory Visit: Payer: Medicare Other

## 2021-02-23 ENCOUNTER — Other Ambulatory Visit: Payer: Medicare Other

## 2021-02-23 LAB — IGG: IgG (Immunoglobin G), Serum: 728 mg/dL (ref 603–1613)

## 2021-03-02 ENCOUNTER — Inpatient Hospital Stay: Payer: Medicare Other

## 2021-03-02 ENCOUNTER — Other Ambulatory Visit: Payer: Self-pay

## 2021-03-02 ENCOUNTER — Other Ambulatory Visit: Payer: Medicare Other

## 2021-03-02 ENCOUNTER — Other Ambulatory Visit: Payer: Self-pay | Admitting: *Deleted

## 2021-03-02 VITALS — BP 139/69 | HR 75 | Temp 97.5°F | Resp 18 | Wt 184.0 lb

## 2021-03-02 DIAGNOSIS — C9000 Multiple myeloma not having achieved remission: Secondary | ICD-10-CM

## 2021-03-02 DIAGNOSIS — R9089 Other abnormal findings on diagnostic imaging of central nervous system: Secondary | ICD-10-CM

## 2021-03-02 DIAGNOSIS — Z9481 Bone marrow transplant status: Secondary | ICD-10-CM

## 2021-03-02 DIAGNOSIS — T451X5A Adverse effect of antineoplastic and immunosuppressive drugs, initial encounter: Secondary | ICD-10-CM

## 2021-03-02 DIAGNOSIS — Z5112 Encounter for antineoplastic immunotherapy: Secondary | ICD-10-CM | POA: Diagnosis not present

## 2021-03-02 LAB — CBC WITH DIFFERENTIAL (CANCER CENTER ONLY)
Abs Immature Granulocytes: 0.02 10*3/uL (ref 0.00–0.07)
Basophils Absolute: 0 10*3/uL (ref 0.0–0.1)
Basophils Relative: 0 %
Eosinophils Absolute: 0.1 10*3/uL (ref 0.0–0.5)
Eosinophils Relative: 2 %
HCT: 31.1 % — ABNORMAL LOW (ref 39.0–52.0)
Hemoglobin: 10.4 g/dL — ABNORMAL LOW (ref 13.0–17.0)
Immature Granulocytes: 0 %
Lymphocytes Relative: 13 %
Lymphs Abs: 0.8 10*3/uL (ref 0.7–4.0)
MCH: 34.2 pg — ABNORMAL HIGH (ref 26.0–34.0)
MCHC: 33.4 g/dL (ref 30.0–36.0)
MCV: 102.3 fL — ABNORMAL HIGH (ref 80.0–100.0)
Monocytes Absolute: 0.6 10*3/uL (ref 0.1–1.0)
Monocytes Relative: 11 %
Neutro Abs: 4.3 10*3/uL (ref 1.7–7.7)
Neutrophils Relative %: 74 %
Platelet Count: 107 10*3/uL — ABNORMAL LOW (ref 150–400)
RBC: 3.04 MIL/uL — ABNORMAL LOW (ref 4.22–5.81)
RDW: 17.6 % — ABNORMAL HIGH (ref 11.5–15.5)
WBC Count: 5.8 10*3/uL (ref 4.0–10.5)
nRBC: 0 % (ref 0.0–0.2)

## 2021-03-02 MED ORDER — DIPHENHYDRAMINE HCL 25 MG PO CAPS
ORAL_CAPSULE | ORAL | Status: AC
  Filled 2021-03-02: qty 1

## 2021-03-02 MED ORDER — DIPHENHYDRAMINE HCL 25 MG PO CAPS
25.0000 mg | ORAL_CAPSULE | Freq: Once | ORAL | Status: AC
Start: 2021-03-02 — End: 2021-03-02
  Administered 2021-03-02: 25 mg via ORAL

## 2021-03-02 MED ORDER — BORTEZOMIB CHEMO SQ INJECTION 3.5 MG (2.5MG/ML)
1.3000 mg/m2 | Freq: Once | INTRAMUSCULAR | Status: AC
  Administered 2021-03-02: 2.5 mg via SUBCUTANEOUS
  Filled 2021-03-02: qty 1

## 2021-03-02 MED ORDER — ACETAMINOPHEN 325 MG PO TABS
ORAL_TABLET | ORAL | Status: AC
  Filled 2021-03-02: qty 2

## 2021-03-02 MED ORDER — DEXAMETHASONE 4 MG PO TABS
10.0000 mg | ORAL_TABLET | Freq: Once | ORAL | Status: AC
  Administered 2021-03-02: 10 mg via ORAL

## 2021-03-02 MED ORDER — ACETAMINOPHEN 325 MG PO TABS
650.0000 mg | ORAL_TABLET | Freq: Once | ORAL | Status: AC
  Administered 2021-03-02: 650 mg via ORAL

## 2021-03-02 MED ORDER — DEXAMETHASONE 4 MG PO TABS
ORAL_TABLET | ORAL | Status: AC
  Filled 2021-03-02: qty 3

## 2021-03-02 MED ORDER — DARATUMUMAB-HYALURONIDASE-FIHJ 1800-30000 MG-UT/15ML ~~LOC~~ SOLN
1800.0000 mg | Freq: Once | SUBCUTANEOUS | Status: AC
  Administered 2021-03-02: 1800 mg via SUBCUTANEOUS
  Filled 2021-03-02: qty 15

## 2021-03-02 NOTE — Patient Instructions (Signed)
[facility] ONCOLOG[facility]  Discharge Instructions: Thank you for choosing [facility] to provide your oncology and hematology care.   If you have a lab appointment with the [facility], please go directly to the [facility] and check in at the registration area.   Wear comfortable clothing and clothing appropriate for easy access to any Portacath or PICC line.   We strive to give you quality time with your provider. You may need to reschedule your appointment if you arrive late (15 or more minutes).  Arriving late affects you and other patients whose appointments are after yours.  Also, if you miss three or more appointments without notifying the office, you may be dismissed from the clinic at the provider's discretion.      For prescription refill requests, have your pharmacy contact our office and allow 72 hours for refills to be completed.    Today you received the following chemotherapy and/or immunotherapy agents: Darzalex Faspro, Velcade.      To help prevent nausea and vomiting after your treatment, we encourage you to take your nausea medication as directed.  BELOW ARE SYMPTOMS THAT SHOULD BE REPORTED IMMEDIATELY: *FEVER GREATER THAN 100.4 F (38 C) OR HIGHER *CHILLS OR SWEATING *NAUSEA AND VOMITING THAT IS NOT CONTROLLED WITH YOUR NAUSEA MEDICATION *UNUSUAL SHORTNESS OF BREATH *UNUSUAL BRUISING OR BLEEDING *URINARY PROBLEMS (pain or burning when urinating, or frequent urination) *BOWEL PROBLEMS (unusual diarrhea, constipation, pain near the anus) TENDERNESS IN MOUTH AND THROAT WITH OR WITHOUT PRESENCE OF ULCERS (sore throat, sores in mouth, or a toothache) UNUSUAL RASH, SWELLING OR PAIN  UNUSUAL VAGINAL DISCHARGE OR ITCHING   Items with * indicate a potential emergency and should be followed up as soon as possible or go to the Emergency Department if any problems should occur.  Please show the CHEMOTHERAPY ALERT CARD or IMMUNOTHERAPY ALERT CARD  at check-in to the Emergency Department and triage nurse.  Should you have questions after your visit or need to cancel or reschedule your appointment, please contact [facility]  Dept: [phone]  and follow the prompts.  Office hours are 8:00 a.m. to 4:30 p.m. Monday - Friday. Please note that voicemails left after 4:00 p.m. may not be returned until the following business day.  We are closed weekends and major holidays. You have access to a nurse at all times for urgent questions. Please call the main number to the clinic Dept: [phone] and follow the prompts.   For any non-urgent questions, you may also contact your provider using MyChart. We now offer e-Visits for anyone 31 and older to request care online for non-urgent symptoms. For details visit [email or web address].   Also download the MyChart app! Go to the app store, search "MyChart", open the app, select [facility], and log in with your MyChart username and password.  Due to Covid, a mask is required upon entering the hospital/clinic. If you do not have a mask, one will be given to you upon arrival. For doctor visits, patients may have 1 support person aged 72 or older with them. For treatment visits, patients cannot have anyone with them due to current Covid guidelines and our immunocompromised population.

## 2021-03-02 NOTE — Progress Notes (Signed)
Per Dr. [name] ok to treat today with CMET from 6/1

## 2021-03-03 LAB — PROTEIN ELECTROPHORESIS, SERUM
A/G Ratio: 1.4 (ref 0.7–1.7)
Albumin ELP: 3.4 g/dL (ref 2.9–4.4)
Alpha-1-Globulin: 0.2 g/dL (ref 0.0–0.4)
Alpha-2-Globulin: 0.7 g/dL (ref 0.4–1.0)
Beta Globulin: 0.9 g/dL (ref 0.7–1.3)
Gamma Globulin: 0.6 g/dL (ref 0.4–1.8)
Globulin, Total: 2.4 g/dL (ref 2.2–3.9)
M-Spike, %: 0.4 g/dL — ABNORMAL HIGH
Total Protein ELP: 5.8 g/dL — ABNORMAL LOW (ref 6.0–8.5)

## 2021-03-03 LAB — KAPPA/LAMBDA LIGHT CHAINS
Kappa free light chain: 7.5 mg/L (ref 3.3–19.4)
Kappa, lambda light chain ratio: 0.33 (ref 0.26–1.65)
Lambda free light chains: 22.7 mg/L (ref 5.7–26.3)

## 2021-03-03 LAB — IGG: IgG (Immunoglobin G), Serum: 691 mg/dL (ref 603–1613)

## 2021-03-22 NOTE — Progress Notes (Signed)
[facility]  [phone] [phone]    ID: Andre [name]  DOB: 11/17/1950  MR#: [id number] CSN#: [id number]   Patient Care Team: [name], Aleksei V, MD as PCP - General (Internal Medicine) ,  C, MD as Consulting Physician (Oncology) Hartsell, Fletcher Lee III, MD as Consulting Physician (Neurology) Andre, Cristina, MD as Consulting Physician (Internal Medicine) Willis, Charles K, MD as Consulting Physician (Neurology) Andre [name], [name] as Referring Physician (Oral Surgery) OTHERS: Andre [name]   CHIEF COMPLAINT: Multiple myeloma  CURRENT TREATMENT: bortezomib/daratumumab/dexamethasone, zoledronate   INTERVAL HISTORY:  Andre [name] returns today for follow-up and treatment of Andre multiple myeloma.   Andre [name] continues on bortezomib, daratumumab, dexamethasone in preparation for eventual CarTcell treatment..  Andre [name] is tolerating this generally well, except Andre [name] tells me if Andre [name] gets the day 8 treatment Andre [name] feels very fatigued.  Accordingly Andre [name] canceled the day 8 treatment of this cycle before this 1.  Also Andre [name] has been feeling "itchy".  This comes and goes and its not all over but it is bothersome to him.  This may well be due to the daratumumab.    Andre [name] also continues on zolendronate, which Andre [name] received 12/06/2020.  Andre [name] tolerates this well except it makes him a bit confused for a few days, possibly due to calcium fluxes.  Andre [name] has a dose scheduled for today.  Lab Results  Component Value Date   TOTALPROTELP 5.8 (L) 03/02/2021   ALBUMINELP 3.4 03/02/2021   A1GS 0.2 03/02/2021   A2GS 0.7 03/02/2021   BETS 0.9 03/02/2021   BETA2SER 0.4 05/11/2015   GAMS 0.6 03/02/2021   MSPIKE 0.4 (H) 03/02/2021   SPEI Comment 03/02/2021   Lab Results  Component Value Date   KPAFRELGTCHN 7.5 03/02/2021   LAMBDASER 22.7 03/02/2021   KAPLAMBRATIO 0.33 03/02/2021    REVIEW OF SYSTEMS: [name] Andre [name] and Andre [name] really liked it.   Andre [name] is thinking of switching Andre care there as well.  Andre [name] has heard good things about Andre [name].  Andre [name] did have a brief exchange with Dr. Gasparetto and basically the appointment they had was canceled since "everything is stable".  Andre [name] is very hesitant to consider CAR T-cells and Dr. Gasparetto [name] has not been pushing that.  The next appointment at [facility] will be sometime in October and virtual.  Andre [name] did have an episode of heat exhaustion during the holiday.  Andre [name] apparently was dehydrated and almost passed out.  Otherwise Andre [name] enjoyed the holiday with Andre [name] and the kids.  A detailed review of systems today was benign except as noted   COVID 19 VACCINATION STATUS: Pfizer x2, most recently 11/2019   HISTORY OF PRESENT ILLNESS:  From the original intake nodes:  Andre [name] was worked up for peptic ulcer disease in August of 2011, with significant bleeding and anemia.  The patient was Helicobacter pylori negative.  Andre [name] received some epinephrine when Andre [name] had Andre EGD and then started on Protonix.  [name] anemia slowly resolved so that by September, Andre hemoglobin was up to 10.9 and by earlier this month, Andre hemoglobin was up to 12.5.    As part of Andre general workup, Andre [name] was found to have a slightly elevated globulin fraction.  In September, Andre total protein was 8.3 with an albumin of 3.8.  In January, the total protein was 8.4 and albumin 3.6.  With persistence of this slight abnormality, Dr. Brodie obtained serum immunofixation and SPEP.    The SPTP showed an M-spike of 2.67 grams.  A total IgG was 4,190.  Total IgA low at 28.  Total IgM low at 28 also.  The immunofixation showed a monoclonal IgG lambda paraprotein.  There were also monoclonal free lambda light chains present. With this information, the patient was referred for further evaluation.   A diagnosis of myeloma was confirmed by bone marrow biopsy and subsequebnt treatment is as detailed below   PAST MEDICAL HISTORY: Past Medical  History:  Diagnosis Date   Allergy    Anemia    Arthritis    Asthma    no treatment x 20 years   Depression    Double vision    occurs at times    Duodenal ulcer    GERD (gastroesophageal reflux disease)    Hyperlipidemia    Hypertension    Hypothyroidism    Multiple myeloma 07/04/2011   Thyroid disease   Significant for peptic ulcer disease as noted above. History of anxiety and depression.  History of GERD which is significantly improved with weight loss and history of reactive airway disease, possibly secondary to the GERD which also improved with weight loss.  There was a history of obesity, now much improved secondary to exercise and diet.    PAST SURGICAL HISTORY: Past Surgical History:  Procedure Laterality Date   BONE MARROW TRANSPLANT  2011   for MM   CARDIOLITE STUDY  11/25/2003   NORMAL   TONSILLECTOMY      FAMILY HISTORY: Family History  Problem Relation Age of Onset   Throat cancer Mother    Hypertension Father    Stroke Father    Asthma Father    Diabetes Father    Pancreatic cancer Brother   [name] father died at the age of 84 following a stroke.  [name] mother died with cancer of the throat at the age of 74.   She had a history of depression and was a smoker.  Not clear how much alcohol she drank, according to the patient.  The patient had two brothers.  One died from the age of 6 from a "kidney problem."  The second one died at the age of 56 from pancreatic cancer. (This may have been duodenal cancer. The patient is not sure.)   SOCIAL HISTORY: (Updated August 2018) Andre [name] worked as a contractor.  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  Andre [name] of 37+ years was Andre [name], but they divorced. In 2016 Andre [name] remarried, Andre current [name]'s name is Andre [name].  The patient has two daughters:  Andre [name], who works in real estate, and Andre [name], who had her second child April 2016.  Both daughters live here in [place].  Andre [name]'s first daughter, Andre [name], born 03/06/2013, apparently has Weber/Osler/Rendu. The patient is not a church attender.  Andre [name] also participates in Al-Anon even though actually there is no alcohol or drug problem in the family.     ADVANCED DIRECTIVES: In place   HEALTH MAINTENANCE:  Social History   Tobacco Use   Smoking status: Former    Years: 3.00    Pack years: 0.00    Types: Cigarettes    Quit date: 03/27/1969    Years since quitting: 52.0   Smokeless tobacco: Never  Substance Use Topics   Alcohol use: No   Drug use: No    Colonoscopy: January 2015  PSA: December 2018, normal/Dr. [name]  Lipid   panel: per Dr. [name]  Allergies  Allergen Reactions   Atorvastatin Other (See Comments)   Rosuvastatin     Other reaction(s): Liver Disorder   Crestor [Rosuvastatin Calcium]     ADVERSE EFFECTS ON LIVER   Lipitor [Atorvastatin Calcium]    Septra [Sulfamethoxazole-Trimethoprim] Rash   Current Outpatient Medications  Medication Sig Dispense Refill   calcium-vitamin D (OSCAL WITH D) 500-200 MG-UNIT tablet Take 1 tablet by mouth daily.     cetirizine (ZYRTEC) 10 MG tablet Take 10 mg by mouth daily.     Cholecalciferol (VITAMIN D3) 2000 units capsule Take 1 capsule (2,000 Units total) by mouth daily. 100 capsule 3   dexamethasone (DECADRON) 4 MG tablet Take 2.5 tablets (10 mg total) by mouth once a week. Take the day after chemotherapy. 30 tablet 3   diphenhydrAMINE (BENADRYL) 25 mg capsule Take 50 mg by mouth every 6 (six) hours as needed. Takes 2 benadryl at bedtime for itching     docusate sodium (COLACE) 100 MG capsule Take 100 mg by mouth daily as needed for mild constipation or moderate constipation.     escitalopram (LEXAPRO) 10 MG tablet Take 1 tablet (10 mg total) by mouth daily. 90 tablet 3   HYDROCORTISONE ACE, RECTAL, 30 MG SUPP 1 pr bid x 7 d prn 14 suppository 3   hydrocortisone valerate cream (WESTCORT) 0.2 % Apply 1  application topically 2 (two) times daily. 60 g 2   levothyroxine (SYNTHROID) 150 MCG tablet TAKE 1 TABLET BY MOUTH EVERY MORNING ON AN EMPTY STOMACH 90 tablet 3   LORazepam (ATIVAN) 0.5 MG tablet Take 1 tablet (0.5 mg total) by mouth at bedtime as needed for anxiety. 20 tablet 0   ondansetron (ZOFRAN) 8 MG tablet TAKE 1 TABLET(8 MG) BY MOUTH EVERY 8 HOURS AS NEEDED FOR NAUSEA OR VOMITING 20 tablet 3   pantoprazole (PROTONIX) 40 MG tablet TAKE 1 TABLET BY MOUTH TWICE DAILY BEFORE MEALS 180 tablet 3   prochlorperazine (COMPAZINE) 10 MG tablet Take 1 tablet (10 mg total) by mouth every 6 (six) hours as needed (Nausea or vomiting). 30 tablet 1   triamcinolone ointment (KENALOG) 0.1 % Apply 1 application topically 2 (two) times daily. 80 g 2   valACYclovir (VALTREX) 1000 MG tablet TAKE 1 TABLET(1000 MG) BY MOUTH DAILY 90 tablet 3   zolendronic acid (ZOMETA) 4 MG/5ML injection Inject 4 mg into the vein every 3 (three) months.     No current facility-administered medications for this visit.   Objective: white man in no acute distress Vitals:   03/23/21 0825  BP: (!) 148/73  Pulse: 65  Resp: 16  Temp: 98.1 F (36.7 C)  SpO2: 100%   Wt Readings from Last 3 Encounters:  03/23/21 179 lb 14.4 oz (81.6 kg)  03/02/21 184 lb (83.5 kg)  02/22/21 181 lb (82.1 kg)   Body mass index is 26.57 kg/m.    ECOG FS:1 - Symptomatic but completely ambulatory  Sclerae unicteric, EOMs intact Wearing a mask No cervical or supraclavicular adenopathy Lungs no rales or rhonchi Heart regular rate and rhythm Abd soft, nontender, positive bowel sounds MSK no focal spinal tenderness, no upper extremity lymphedema Neuro: nonfocal, well oriented, appropriate affect   LAB RESULTS:  Appointment on 03/23/2021  Component Date Value Ref Range Status   Sodium 03/23/2021 137  135 - 145 mmol/L Final   Potassium 03/23/2021 4.8  3.5 - 5.1 mmol/L Final   Chloride 03/23/2021 105  98 - 111 mmol/L Final     CO2 03/23/2021  24  22 - 32 mmol/L Final   Glucose, Bld 03/23/2021 72  70 - 99 mg/dL Final   Glucose reference range applies only to samples taken after fasting for at least 8 hours.   BUN 03/23/2021 23  8 - 23 mg/dL Final   Creatinine 03/23/2021 1.13  0.61 - 1.24 mg/dL Final   Calcium 03/23/2021 9.7  8.9 - 10.3 mg/dL Final   Total Protein 03/23/2021 7.0  6.5 - 8.1 g/dL Final   Albumin 03/23/2021 3.7  3.5 - 5.0 g/dL Final   AST 03/23/2021 19  15 - 41 U/L Final   ALT 03/23/2021 13  0 - 44 U/L Final   Alkaline Phosphatase 03/23/2021 65  38 - 126 U/L Final   Total Bilirubin 03/23/2021 0.4  0.3 - 1.2 mg/dL Final   GFR, Estimated 03/23/2021 >60  >60 mL/min Final   Comment: (NOTE) Calculated using the CKD-EPI Creatinine Equation (2021)    Anion gap 03/23/2021 8  5 - 15 Final   Performed at [facility], [address] [place], [place] [address]   WBC Count 03/23/2021 5.0  4.0 - 10.5 [name]/uL Final   RBC 03/23/2021 3.28 (A) 4.22 - 5.81 MIL/uL Final   Hemoglobin 03/23/2021 11.2 (A) 13.0 - 17.0 g/dL Final   HCT 03/23/2021 33.2 (A) 39.0 - 52.0 % Final   MCV 03/23/2021 101.2 (A) 80.0 - 100.0 fL Final   MCH 03/23/2021 34.1 (A) 26.0 - 34.0 pg Final   MCHC 03/23/2021 33.7  30.0 - 36.0 g/dL Final   RDW 03/23/2021 16.9 (A) 11.5 - 15.5 % Final   Platelet Count 03/23/2021 132 (A) 150 - 400 [name]/uL Final   nRBC 03/23/2021 0.0  0.0 - 0.2 % Final   Neutrophils Relative % 03/23/2021 62  % Final   Neutro Abs 03/23/2021 3.1  1.7 - 7.7 [name]/uL Final   Lymphocytes Relative 03/23/2021 19  % Final   Lymphs Abs 03/23/2021 0.9  0.7 - 4.0 [name]/uL Final   Monocytes Relative 03/23/2021 16  % Final   Monocytes Absolute 03/23/2021 0.8  0.1 - 1.0 [name]/uL Final   Eosinophils Relative 03/23/2021 2  % Final   Eosinophils Absolute 03/23/2021 0.1  0.0 - 0.5 [name]/uL Final   Basophils Relative 03/23/2021 1  % Final   Basophils Absolute 03/23/2021 0.0  0.0 - 0.1 [name]/uL Final   Immature Granulocytes 03/23/2021 0  % Final   Abs  Immature Granulocytes 03/23/2021 0.01  0.00 - 0.07 [name]/uL Final   Performed at [facility], [address] [place], [place] [address]    STUDIES: No results found.    ASSESSMENT: 69 y.o. Andre [name] man with a history of multiple myeloma diagnosed February of 2012, with an initial M-spike of 2.6 g/dL, IFE showing an IgG lambda paraprotein and free lambda light chains in the urine. Cytogenetics showed trisomy 11. Bone marrow biopsy showed a 22% plasmacytosis. Treated with   (1) bortezomib (subcutaneously), lenalidomide, and dexamethasone, with repeat bone marrow biopsy May of 2012 showing 10% plasmacytosis   (2) high-dose chemotherapy with BCNU and melphalan at [facility], followed by stem cell rescue July of 2012   (3) on zoledronic acid started December of 2012, initially monthly, currently given every 3 months, most recent dose  12/07/2015  (4) low-dose lenalidomide resumed April 2013, interrupted several times.  Resumed again on 02/19/2013, ata dose of 5 mg daily, 21 days on and 7 days off, later further reduced to 7 days   on, 7 days off  (5) CNS symptoms and abnormal brain MRI extensively evaluated by neurology with no definitive diagnosis established  (6) rising M spike noted June 2014 but did not meet criteria for carfilzomib study  (7) on lenalidomide 25 mg daily, 14 days on, 7 days off, starting 04/18/2013, interrupted December 2014 because of rash;   (a) resumed January 2015 at 10 mg/ day at 21 days on/ 7 days off  (Arieh) starting 08/18/2014 decreased to 10 mg/ day 14 days on, 7 days off because of cytopenias  (c) as of February 2016 was on 5 mg daily 7 days on 7 days off  (d) lenalidomide discontinued December 2016 with evidence of disease progression  (8) transient global amnesia 05/29/2015, resolved without intervention  (9) starting PVD 10/25/2015 w ASA 325 thromboprophylaxis, valacyclovir prophylaxis, last dose 12/17/2015  (a) pomalidomide 4 mg/d days  1-14  (Nirav) bortezomib sQ days 2,5,9,12 of each 21 day cycle  (c) dexamethasome 20 mg two days a week  (d) dexamethasone bortezomib and pomalidomide discontinued late December 2018 with poor tolerance  (10) metapneumovirus pneumonia April 2017  (a) completing course of steroids and week of bactrim mid April 2017  (11) status post second autologous transplant at [facility] 02/04/2016(preparatory regimen melphalan 200 mg/m)  (a) received twelve-month vaccinations 03/14/2017 (DPT, Haemophilus, Pneumovax 13, polio)  (Kenley) 14 months injections 05/04/2016 include DTaP, Hib conjugate, HepB energex Prithvi 20 mcg/ml, Prevnar 13  (c) 24 month vaccines due at [facility] June 2019  (12) maintenance therapy started November 2017, consisting of  (a) bortezomib 1.3 mg/M2 every 14 days, first dose 07/27/2016  (Tiron) pomalidomide 1 mg days 1-21 Q28 days, started 07/19/2016  (c) zolendronate monthly started 07/27/2016 (previously Q 3 months) however patient unable to tolerate, and changed back to q3 months in April, 2018   (d) Bortezomib changed to monthly as of June 2018 because of tolerance issues, however discontinued after 09/14/2017 dose because of a rise in Andre M spike  (e) pomalidomide held after 10/18/2017 in preparation for possible study at [facility] (venetoclax)--never resumed  (f) with numbers actually improved off treatment, resumed every 2-week bortezomib 12/25/2017  (g) changed to every 3-week bortezomib as of 03/17/2019  (h) briefly on weekly treatments times 22 October 2020 due to increase in M spike  (I) maintenance therapy discontinued with evidence of progression  (13) bortezomib/daratumumab/dexamethasone started 12/20/2020.  (a) Decadron dose dropped to 10 mg day of and day following treatment starting May 2022  ([place]) day 8 bortezomib omitted beginning with the June cycle per patient preference   PLAN:  Andre [name] is now 10-1/2 years out from initial diagnosis of Andre multiple myeloma.  Andre disease is well controlled on  the current treatment, but Andre [name] is running into some issues: Andre [name] gets very tired and so has chosen to omit the day 8 bortezomib; Andre [name] is having some itching which may be due to to the daratumumab.  At this point also Andre [name] would like to transfer Andre care to Dr. Benay Spice at the new [facility] (where Andre [name] also plans to use the gym).  I have run this by Dr. Benay Spice and Andre [name] is agreeable, the plan being for him to meet with Andre [name] sometime before Andre next treatment 04/13/2021 is due, so that if any changes need to be made in Andre treatment plan they can be made before that date  Andre [name] also has a telemedicine visit with Dr. Alvie Heidelberg at [facility] 07/14/2021.  Total encounter time 25 minutes.*  Gaynel Schaafsma, [name],  MD  03/23/21 5:59 PM Medical Oncology and Hematology [facility] [address] [place], [place] [address] Tel. [phone]    Fax. [phone]    I, Katie Daubenspeck, am acting as scribe for Dr.  C. .  I,   MD, have reviewed the above documentation for accuracy and completeness, and I agree with the above.   *Total Encounter Time as defined by the Centers for Medicare and Medicaid Services includes, in addition to the face-to-face time of a patient visit (documented in the note above) non-face-to-face time: obtaining and reviewing outside history, ordering and reviewing medications, tests or procedures, care coordination (communications with other health care professionals or caregivers) and documentation in the medical record.  

## 2021-03-23 ENCOUNTER — Inpatient Hospital Stay: Payer: Medicare Other

## 2021-03-23 ENCOUNTER — Other Ambulatory Visit: Payer: Medicare Other

## 2021-03-23 ENCOUNTER — Other Ambulatory Visit: Payer: Self-pay

## 2021-03-23 ENCOUNTER — Inpatient Hospital Stay: Payer: Medicare Other | Attending: Oncology

## 2021-03-23 ENCOUNTER — Telehealth: Payer: Self-pay | Admitting: *Deleted

## 2021-03-23 ENCOUNTER — Inpatient Hospital Stay (HOSPITAL_BASED_OUTPATIENT_CLINIC_OR_DEPARTMENT_OTHER): Payer: Medicare Other | Admitting: Oncology

## 2021-03-23 VITALS — BP 148/73 | HR 65 | Temp 98.1°F | Resp 16 | Ht 69.0 in | Wt 179.9 lb

## 2021-03-23 DIAGNOSIS — Z9481 Bone marrow transplant status: Secondary | ICD-10-CM

## 2021-03-23 DIAGNOSIS — Z79899 Other long term (current) drug therapy: Secondary | ICD-10-CM | POA: Diagnosis not present

## 2021-03-23 DIAGNOSIS — T451X5A Adverse effect of antineoplastic and immunosuppressive drugs, initial encounter: Secondary | ICD-10-CM

## 2021-03-23 DIAGNOSIS — C9000 Multiple myeloma not having achieved remission: Secondary | ICD-10-CM | POA: Diagnosis present

## 2021-03-23 DIAGNOSIS — Z5112 Encounter for antineoplastic immunotherapy: Secondary | ICD-10-CM | POA: Diagnosis present

## 2021-03-23 LAB — CBC WITH DIFFERENTIAL (CANCER CENTER ONLY)
Abs Immature Granulocytes: 0.01 10*3/uL (ref 0.00–0.07)
Basophils Absolute: 0 10*3/uL (ref 0.0–0.1)
Basophils Relative: 1 %
Eosinophils Absolute: 0.1 10*3/uL (ref 0.0–0.5)
Eosinophils Relative: 2 %
HCT: 33.2 % — ABNORMAL LOW (ref 39.0–52.0)
Hemoglobin: 11.2 g/dL — ABNORMAL LOW (ref 13.0–17.0)
Immature Granulocytes: 0 %
Lymphocytes Relative: 19 %
Lymphs Abs: 0.9 10*3/uL (ref 0.7–4.0)
MCH: 34.1 pg — ABNORMAL HIGH (ref 26.0–34.0)
MCHC: 33.7 g/dL (ref 30.0–36.0)
MCV: 101.2 fL — ABNORMAL HIGH (ref 80.0–100.0)
Monocytes Absolute: 0.8 10*3/uL (ref 0.1–1.0)
Monocytes Relative: 16 %
Neutro Abs: 3.1 10*3/uL (ref 1.7–7.7)
Neutrophils Relative %: 62 %
Platelet Count: 132 10*3/uL — ABNORMAL LOW (ref 150–400)
RBC: 3.28 MIL/uL — ABNORMAL LOW (ref 4.22–5.81)
RDW: 16.9 % — ABNORMAL HIGH (ref 11.5–15.5)
WBC Count: 5 10*3/uL (ref 4.0–10.5)
nRBC: 0 % (ref 0.0–0.2)

## 2021-03-23 LAB — CMP (CANCER CENTER ONLY)
ALT: 13 U/L (ref 0–44)
AST: 19 U/L (ref 15–41)
Albumin: 3.7 g/dL (ref 3.5–5.0)
Alkaline Phosphatase: 65 U/L (ref 38–126)
Anion gap: 8 (ref 5–15)
BUN: 23 mg/dL (ref 8–23)
CO2: 24 mmol/L (ref 22–32)
Calcium: 9.7 mg/dL (ref 8.9–10.3)
Chloride: 105 mmol/L (ref 98–111)
Creatinine: 1.13 mg/dL (ref 0.61–1.24)
GFR, Estimated: >60 mL/min (ref >60)
Glucose, Bld: 72 mg/dL (ref 70–99)
Potassium: 4.8 mmol/L (ref 3.5–5.1)
Sodium: 137 mmol/L (ref 135–145)
Total Bilirubin: 0.4 mg/dL (ref 0.3–1.2)
Total Protein: 7 g/dL (ref 6.5–8.1)

## 2021-03-23 MED ORDER — ZOLEDRONIC ACID 4 MG/100ML IV SOLN
4.0000 mg | Freq: Once | INTRAVENOUS | Status: AC
  Administered 2021-03-23: 4 mg via INTRAVENOUS

## 2021-03-23 MED ORDER — ACETAMINOPHEN 325 MG PO TABS
ORAL_TABLET | ORAL | Status: AC
  Filled 2021-03-23: qty 2

## 2021-03-23 MED ORDER — DARATUMUMAB-HYALURONIDASE-FIHJ 1800-30000 MG-UT/15ML ~~LOC~~ SOLN
1800.0000 mg | Freq: Once | SUBCUTANEOUS | Status: AC
  Administered 2021-03-23: 1800 mg via SUBCUTANEOUS
  Filled 2021-03-23: qty 15

## 2021-03-23 MED ORDER — BORTEZOMIB CHEMO SQ INJECTION 3.5 MG (2.5MG/ML)
1.3000 mg/m2 | Freq: Once | INTRAMUSCULAR | Status: AC
  Administered 2021-03-23: 2.5 mg via SUBCUTANEOUS
  Filled 2021-03-23: qty 1

## 2021-03-23 MED ORDER — DIPHENHYDRAMINE HCL 25 MG PO CAPS
25.0000 mg | ORAL_CAPSULE | Freq: Once | ORAL | Status: AC
Start: 2021-03-23 — End: 2021-03-23
  Administered 2021-03-23: 25 mg via ORAL

## 2021-03-23 MED ORDER — DIPHENHYDRAMINE HCL 25 MG PO CAPS
ORAL_CAPSULE | ORAL | Status: AC
  Filled 2021-03-23: qty 1

## 2021-03-23 MED ORDER — ZOLEDRONIC ACID 4 MG/100ML IV SOLN
INTRAVENOUS | Status: AC
  Filled 2021-03-23: qty 100

## 2021-03-23 MED ORDER — ACETAMINOPHEN 325 MG PO TABS
650.0000 mg | ORAL_TABLET | Freq: Once | ORAL | Status: AC
  Administered 2021-03-23: 650 mg via ORAL

## 2021-03-23 MED ORDER — DEXAMETHASONE 4 MG PO TABS
10.0000 mg | ORAL_TABLET | Freq: Once | ORAL | Status: AC
  Administered 2021-03-23: 10 mg via ORAL

## 2021-03-23 MED ORDER — DEXAMETHASONE 4 MG PO TABS
ORAL_TABLET | ORAL | Status: AC
  Filled 2021-03-23: qty 3

## 2021-03-23 NOTE — Telephone Encounter (Signed)
Per MD review and visit with pt - today is day 1 cycle 5 of therapy regimen.

## 2021-03-24 ENCOUNTER — Telehealth: Payer: Self-pay | Admitting: Oncology

## 2021-03-24 LAB — KAPPA/LAMBDA LIGHT CHAINS
Kappa free light chain: 8.1 mg/L (ref 3.3–19.4)
Kappa, lambda light chain ratio: 0.3 (ref 0.26–1.65)
Lambda free light chains: 26.8 mg/L — ABNORMAL HIGH (ref 5.7–26.3)

## 2021-03-24 LAB — PROTEIN ELECTROPHORESIS, SERUM
A/G Ratio: 1.4 (ref 0.7–1.7)
Albumin ELP: 3.7 g/dL (ref 2.9–4.4)
Alpha-1-Globulin: 0.2 g/dL (ref 0.0–0.4)
Alpha-2-Globulin: 0.8 g/dL (ref 0.4–1.0)
Beta Globulin: 1 g/dL (ref 0.7–1.3)
Gamma Globulin: 0.6 g/dL (ref 0.4–1.8)
Globulin, Total: 2.7 g/dL (ref 2.2–3.9)
M-Spike, %: 0.4 g/dL — ABNORMAL HIGH
Total Protein ELP: 6.4 g/dL (ref 6.0–8.5)

## 2021-03-24 NOTE — Telephone Encounter (Signed)
Called pt per 7/7 sch msg - unable to reach pt . Left message for patient with appt date and time

## 2021-04-06 ENCOUNTER — Other Ambulatory Visit: Payer: Self-pay

## 2021-04-06 ENCOUNTER — Inpatient Hospital Stay (HOSPITAL_BASED_OUTPATIENT_CLINIC_OR_DEPARTMENT_OTHER): Payer: Medicare Other | Admitting: Oncology

## 2021-04-06 VITALS — BP 142/70 | HR 74 | Temp 97.8°F | Resp 16 | Wt 181.0 lb

## 2021-04-06 DIAGNOSIS — Z5112 Encounter for antineoplastic immunotherapy: Secondary | ICD-10-CM | POA: Diagnosis not present

## 2021-04-06 DIAGNOSIS — C9000 Multiple myeloma not having achieved remission: Secondary | ICD-10-CM

## 2021-04-06 NOTE — Progress Notes (Signed)
[place] OFFICE PROGRESS NOTE   Diagnosis: Multiple myeloma  INTERVAL HISTORY:   Andre [name] was diagnosed with multiple myeloma in 2012.  He is followed by Dr. Jana [name] and Dr. Samule [name].  He is undergone multiple systemic therapy regimens for treatment of myeloma including 2 autologous stem cell procedures.  The serum M spike was increased earlier this year while maintained on bortezomib maintenance.  Treatment was switched to bortezomib/daratumumab/Decadron beginning 12/20/2020.  He is currently maintained on bortezomib every 2-3 weeks and every 2-week daratumumab.  He complains of diffuse pruritus, worse in the evening.  No rash.  No neuropathy symptoms.  No significant dyspnea or cough.  He exercises 6 days/week.  Good appetite.   Past medical history: Memory loss and confusion Multiple myeloma diagnosed in 2012 Pneumonia 2017  Past surgical history: None  Family history: His brother had pancreas cancer.  His mother had lung cancer and was a smoker   Social history: He lives with his wife in [place].  He is a retired Research officer, trade [employer].  He does not use cigarettes or alcohol.  He has received transfusions in the past.  He has received COVID-19 vaccines, but not the most recent booster.  Review of systems: Positives-malaise, itching in the evening-Benadryl helps, insomnia and confusion after taking Decadron, mild wheezing A complete review of systems was otherwise negative Objective:  Vital signs in last 24 hours:  Blood pressure (!) 142/70, pulse 74, temperature 97.8 F (36.6 C), temperature source Temporal, resp. rate 16, weight 181 lb (82.1 kg), SpO2 99 %.    HEENT: No thrush, ulcer/ecchymosis at the left buccal mucosa Resp: Lungs clear bilaterally Cardio: Regular rate and rhythm GI: No hepatosplenomegaly Vascular: No leg edema  Skin: Light brown discoloration at the right greater than left lower leg, no rash over the trunk   Lab Results:  Lab  Results  Component Value Date   WBC 5.0 03/23/2021   HGB 11.2 (L) 03/23/2021   HCT 33.2 (L) 03/23/2021   MCV 101.2 (H) 03/23/2021   PLT 132 (L) 03/23/2021   NEUTROABS 3.1 03/23/2021    CMP  Lab Results  Component Value Date   NA 137 03/23/2021   K 4.8 03/23/2021   CL 105 03/23/2021   CO2 24 03/23/2021   GLUCOSE 72 03/23/2021   BUN 23 03/23/2021   CREATININE 1.13 03/23/2021   CALCIUM 9.7 03/23/2021   PROT 7.0 03/23/2021   ALBUMIN 3.7 03/23/2021   AST 19 03/23/2021   ALT 13 03/23/2021   ALKPHOS 65 03/23/2021   BILITOT 0.4 03/23/2021   GFRNONAA >60 03/23/2021   GFRAA >60 06/15/2020    No results found for: CEA1, CEA, CAN199, CA125  Lab Results  Component Value Date   INR 1.07 02/27/2016   LABPROT <157 05/15/2016    Imaging:  No results found.  Medications: I have reviewed the patient's current medications.   Assessment/Plan:  70 y.o. [name] with a history of multiple myeloma diagnosed February of 2012, with an initial M-spike of 2.6 g/dL, IFE showing an IgG lambda paraprotein and free lambda light chains in the urine. Cytogenetics showed trisomy 32. Bone marrow biopsy showed a 22% plasmacytosis. Treated with   (1) bortezomib (subcutaneously), lenalidomide, and dexamethasone, with repeat bone marrow biopsy May of 2012 showing 10% plasmacytosis   (2) high-dose chemotherapy with BCNU and melphalan at [facility], followed by stem cell rescue July of 2012   (3) on zoledronic acid started December of 2012, initially monthly, currently given every  3 months, most recent dose  12/07/2015   (4) low-dose lenalidomide resumed April 2013, interrupted several times.  Resumed again on 02/19/2013, ata dose of 5 mg daily, 21 days on and 7 days off, later further reduced to 7 days on, 7 days off   (5) CNS symptoms and abnormal brain MRI extensively evaluated by neurology with no definitive diagnosis established   (6) rising M spike noted June 2014 but did not meet criteria for  carfilzomib study   (7) on lenalidomide 25 mg daily, 14 days on, 7 days off, starting 04/18/2013, interrupted December 2014 because of rash;             (a) resumed January 2015 at 10 mg/ day at 21 days on/ 7 days off             (Andre [name]) starting 08/18/2014 decreased to 10 mg/ day 14 days on, 7 days off because of cytopenias             (c) as of February 2016 was on 5 mg daily 7 days on 7 days off             (d) lenalidomide discontinued December 2016 with evidence of disease progression   (8) transient global amnesia 05/29/2015, resolved without intervention   (9) starting PVD 10/25/2015 w ASA 325 thromboprophylaxis, valacyclovir prophylaxis, last dose 12/17/2015             (a) pomalidomide 4 mg/d days 1-14             (Andre [name]) bortezomib sQ days 2,5,9,12 of each 21 day cycle             (c) dexamethasome 20 mg two days a week             (d) dexamethasone bortezomib and pomalidomide discontinued late December 2018 with poor tolerance   (10) metapneumovirus pneumonia April 2017             (a) completing course of steroids and week of bactrim mid April 2017   (11) status post second autologous transplant at [facility] 02/04/2016(preparatory regimen melphalan 200 mg/m)             (a) received twelve-month vaccinations 03/14/2017 (DPT, Haemophilus, Pneumovax 13, polio)             (Andre [name]) 14 months injections 05/04/2016 include DTaP, Hib conjugate, HepB energex Andre [name] 20 mcg/ml, Prevnar 13             (c) 24 month vaccines due at [facility] June 2019   (12) maintenance therapy started November 2017, consisting of             (a) bortezomib 1.3 mg/M2 every 14 days, first dose 07/27/2016             (Andre [name]) pomalidomide 1 mg days 1-21 Q28 days, started 07/19/2016             (c) zolendronate monthly started 07/27/2016 (previously Q 3 months) however patient unable to tolerate, and changed back to q3 months in April, 2018             (d) Bortezomib changed to monthly as of June 2018 because of tolerance issues, however  discontinued after 09/14/2017 dose because of a rise in his M spike             (e) pomalidomide held after 10/18/2017 in preparation for possible study at [facility] (venetoclax)--never resumed             (  f) with numbers actually improved off treatment, resumed every 2-week bortezomib 12/25/2017             (g) changed to every 3-week bortezomib as of 03/17/2019             (h) briefly on weekly treatments times 22 October 2020 due to increase in M spike             (I) maintenance therapy discontinued with evidence of progression   (13) bortezomib/daratumumab/dexamethasone started 12/20/2020.             (a) Decadron dose dropped to 10 mg day of and day following treatment starting May 2022             (Denys) day 8 bortezomib omitted beginning with the June cycle per patient preference       Disposition: [name] has a long history of multiple myeloma.  He is currently being treated with a salvage regimen consisting of bortezomib, daratumumab, and Decadron.  He appears to be tolerating the treatment well.  It is unclear whether the pruritus is related to the current treatment.  He will try a moisturizer.  I suggested he consider a dermatology evaluation.  The Decadron dose will be decreased to 10 mg on the day of bortezomib.  He will continue every 2-week bortezomib and daratumumab for now.  Daratumumab will be switched to a monthly schedule when he has completed 8 treatments on the 2-week schedule.  He will return to resume bortezomib/daratumumab/Decadron on 04/11/2021.  He will be scheduled for an office visit on 04/25/2021.  50 minutes were spent with the patient today.  The chemotherapy plan was adjusted and signed.    Betsy Coder, MD  04/06/2021  3:07 PM

## 2021-04-08 ENCOUNTER — Other Ambulatory Visit: Payer: Self-pay | Admitting: Nurse Practitioner

## 2021-04-08 DIAGNOSIS — C9000 Multiple myeloma not having achieved remission: Secondary | ICD-10-CM

## 2021-04-11 ENCOUNTER — Inpatient Hospital Stay: Payer: Medicare Other

## 2021-04-11 ENCOUNTER — Other Ambulatory Visit: Payer: Self-pay

## 2021-04-11 VITALS — BP 112/67 | HR 92 | Temp 98.0°F | Resp 18 | Wt 172.0 lb

## 2021-04-11 DIAGNOSIS — C9000 Multiple myeloma not having achieved remission: Secondary | ICD-10-CM

## 2021-04-11 DIAGNOSIS — Z9481 Bone marrow transplant status: Secondary | ICD-10-CM

## 2021-04-11 DIAGNOSIS — Z5112 Encounter for antineoplastic immunotherapy: Secondary | ICD-10-CM | POA: Diagnosis not present

## 2021-04-11 DIAGNOSIS — T451X5A Adverse effect of antineoplastic and immunosuppressive drugs, initial encounter: Secondary | ICD-10-CM

## 2021-04-11 DIAGNOSIS — G620 Drug-induced polyneuropathy: Secondary | ICD-10-CM

## 2021-04-11 LAB — CBC WITH DIFFERENTIAL (CANCER CENTER ONLY)
Abs Immature Granulocytes: 0.01 10*3/uL (ref 0.00–0.07)
Basophils Absolute: 0 10*3/uL (ref 0.0–0.1)
Basophils Relative: 0 %
Eosinophils Absolute: 0.1 10*3/uL (ref 0.0–0.5)
Eosinophils Relative: 3 %
HCT: 33.3 % — ABNORMAL LOW (ref 39.0–52.0)
Hemoglobin: 11.3 g/dL — ABNORMAL LOW (ref 13.0–17.0)
Immature Granulocytes: 0 %
Lymphocytes Relative: 20 %
Lymphs Abs: 1 10*3/uL (ref 0.7–4.0)
MCH: 35.2 pg — ABNORMAL HIGH (ref 26.0–34.0)
MCHC: 33.9 g/dL (ref 30.0–36.0)
MCV: 103.7 fL — ABNORMAL HIGH (ref 80.0–100.0)
Monocytes Absolute: 0.6 10*3/uL (ref 0.1–1.0)
Monocytes Relative: 13 %
Neutro Abs: 3.2 10*3/uL (ref 1.7–7.7)
Neutrophils Relative %: 64 %
Platelet Count: 129 10*3/uL — ABNORMAL LOW (ref 150–400)
RBC: 3.21 MIL/uL — ABNORMAL LOW (ref 4.22–5.81)
RDW: 16.5 % — ABNORMAL HIGH (ref 11.5–15.5)
WBC Count: 5 10*3/uL (ref 4.0–10.5)
nRBC: 0 % (ref 0.0–0.2)

## 2021-04-11 LAB — CMP (CANCER CENTER ONLY)
ALT: 12 U/L (ref 0–44)
AST: 19 U/L (ref 15–41)
Albumin: 4.1 g/dL (ref 3.5–5.0)
Alkaline Phosphatase: 54 U/L (ref 38–126)
Anion gap: 10 (ref 5–15)
BUN: 31 mg/dL — ABNORMAL HIGH (ref 8–23)
CO2: 21 mmol/L — ABNORMAL LOW (ref 22–32)
Calcium: 9 mg/dL (ref 8.9–10.3)
Chloride: 101 mmol/L (ref 98–111)
Creatinine: 1.24 mg/dL (ref 0.61–1.24)
GFR, Estimated: >60 mL/min (ref >60)
Glucose, Bld: 107 mg/dL — ABNORMAL HIGH (ref 70–99)
Potassium: 4.3 mmol/L (ref 3.5–5.1)
Sodium: 132 mmol/L — ABNORMAL LOW (ref 135–145)
Total Bilirubin: 0.4 mg/dL (ref 0.3–1.2)
Total Protein: 6.6 g/dL (ref 6.5–8.1)

## 2021-04-11 MED ORDER — DIPHENHYDRAMINE HCL 25 MG PO CAPS
25.0000 mg | ORAL_CAPSULE | Freq: Once | ORAL | Status: AC
  Administered 2021-04-11: 25 mg via ORAL
  Filled 2021-04-11: qty 1

## 2021-04-11 MED ORDER — BORTEZOMIB CHEMO SQ INJECTION 3.5 MG (2.5MG/ML)
1.3000 mg/m2 | Freq: Once | INTRAMUSCULAR | Status: AC
  Administered 2021-04-11: 2.5 mg via SUBCUTANEOUS
  Filled 2021-04-11: qty 1

## 2021-04-11 MED ORDER — DEXAMETHASONE 4 MG PO TABS
10.0000 mg | ORAL_TABLET | Freq: Once | ORAL | Status: AC
  Administered 2021-04-11: 10 mg via ORAL

## 2021-04-11 MED ORDER — ACETAMINOPHEN 325 MG PO TABS
650.0000 mg | ORAL_TABLET | Freq: Once | ORAL | Status: AC
  Administered 2021-04-11: 650 mg via ORAL
  Filled 2021-04-11: qty 2

## 2021-04-11 MED ORDER — DARATUMUMAB-HYALURONIDASE-FIHJ 1800-30000 MG-UT/15ML ~~LOC~~ SOLN
1800.0000 mg | Freq: Once | SUBCUTANEOUS | Status: AC
  Administered 2021-04-11: 1800 mg via SUBCUTANEOUS
  Filled 2021-04-11: qty 15

## 2021-04-11 NOTE — Patient Instructions (Signed)
No Name  Discharge Instructions: Thank you for choosing [facility] to provide your oncology and hematology care.   If you have a lab appointment with the [facility], please go directly to the [facility] and check in at the registration area.   Wear comfortable clothing and clothing appropriate for easy access to any Portacath or PICC line.   We strive to give you quality time with your provider. You may need to reschedule your appointment if you arrive late (15 or more minutes).  Arriving late affects you and other patients whose appointments are after yours.  Also, if you miss three or more appointments without notifying the office, you may be dismissed from the clinic at the provider's discretion.      For prescription refill requests, have your pharmacy contact our office and allow 72 hours for refills to be completed.    Today you received the following chemotherapy and/or immunotherapy agents Velcade and Daratumumab      To help prevent nausea and vomiting after your treatment, we encourage you to take your nausea medication as directed.  BELOW ARE SYMPTOMS THAT SHOULD BE REPORTED IMMEDIATELY: *FEVER GREATER THAN 100.4 F (38 C) OR HIGHER *CHILLS OR SWEATING *NAUSEA AND VOMITING THAT IS NOT CONTROLLED WITH YOUR NAUSEA MEDICATION *UNUSUAL SHORTNESS OF BREATH *UNUSUAL BRUISING OR BLEEDING *URINARY PROBLEMS (pain or burning when urinating, or frequent urination) *BOWEL PROBLEMS (unusual diarrhea, constipation, pain near the anus) TENDERNESS IN MOUTH AND THROAT WITH OR WITHOUT PRESENCE OF ULCERS (sore throat, sores in mouth, or a toothache) UNUSUAL RASH, SWELLING OR PAIN  UNUSUAL VAGINAL DISCHARGE OR ITCHING   Items with * indicate a potential emergency and should be followed up as soon as possible or go to the Emergency Department if any problems should occur.  Please show the CHEMOTHERAPY ALERT CARD or IMMUNOTHERAPY ALERT CARD at  check-in to the Emergency Department and triage nurse.  Should you have questions after your visit or need to cancel or reschedule your appointment, please contact [facility]  Dept: [phone]  and follow the prompts.  Office hours are 8:00 a.m. to 4:30 p.m. Monday - Friday. Please note that voicemails left after 4:00 p.m. may not be returned until the following business day.  We are closed weekends and major holidays. You have access to a nurse at all times for urgent questions. Please call the main number to the clinic Dept: [phone] and follow the prompts.   For any non-urgent questions, you may also contact your provider using MyChart. We now offer e-Visits for anyone 72 and older to request care online for non-urgent symptoms. For details visit [email or web address].   Also download the MyChart app! Go to the app store, search "MyChart", open the app, select [facility], and log in with your MyChart username and password.  Due to Covid, a mask is required upon entering the hospital/clinic. If you do not have a mask, one will be given to you upon arrival. For doctor visits, patients may have 1 support person aged 40 or older with them. For treatment visits, patients cannot have anyone with them due to current Covid guidelines and our immunocompromised population.

## 2021-04-13 ENCOUNTER — Encounter: Payer: Self-pay | Admitting: Oncology

## 2021-04-13 ENCOUNTER — Other Ambulatory Visit: Payer: Self-pay

## 2021-04-13 ENCOUNTER — Ambulatory Visit (HOSPITAL_BASED_OUTPATIENT_CLINIC_OR_DEPARTMENT_OTHER)
Admission: RE | Admit: 2021-04-13 | Discharge: 2021-04-13 | Disposition: A | Discharge status: Home / Self Care | Payer: Medicare Other | Source: Ambulatory Visit | Attending: Oncology | Admitting: Oncology

## 2021-04-13 DIAGNOSIS — C9000 Multiple myeloma not having achieved remission: Secondary | ICD-10-CM | POA: Diagnosis present

## 2021-04-14 ENCOUNTER — Other Ambulatory Visit (HOSPITAL_BASED_OUTPATIENT_CLINIC_OR_DEPARTMENT_OTHER): Payer: Medicare Other | Admitting: Radiology

## 2021-04-25 ENCOUNTER — Other Ambulatory Visit: Payer: Self-pay | Admitting: *Deleted

## 2021-04-25 ENCOUNTER — Inpatient Hospital Stay (HOSPITAL_BASED_OUTPATIENT_CLINIC_OR_DEPARTMENT_OTHER): Payer: Medicare Other | Admitting: Oncology

## 2021-04-25 ENCOUNTER — Encounter: Payer: Self-pay | Admitting: Oncology

## 2021-04-25 ENCOUNTER — Inpatient Hospital Stay: Payer: Medicare Other

## 2021-04-25 ENCOUNTER — Other Ambulatory Visit (HOSPITAL_BASED_OUTPATIENT_CLINIC_OR_DEPARTMENT_OTHER): Payer: Self-pay

## 2021-04-25 ENCOUNTER — Inpatient Hospital Stay: Payer: Medicare Other | Attending: Oncology

## 2021-04-25 ENCOUNTER — Other Ambulatory Visit: Payer: Self-pay

## 2021-04-25 VITALS — BP 134/75 | HR 82 | Temp 98.0°F | Resp 18 | Ht 69.0 in | Wt 180.2 lb

## 2021-04-25 DIAGNOSIS — C9000 Multiple myeloma not having achieved remission: Secondary | ICD-10-CM

## 2021-04-25 DIAGNOSIS — T451X5A Adverse effect of antineoplastic and immunosuppressive drugs, initial encounter: Secondary | ICD-10-CM

## 2021-04-25 DIAGNOSIS — Z79899 Other long term (current) drug therapy: Secondary | ICD-10-CM | POA: Insufficient documentation

## 2021-04-25 DIAGNOSIS — Z5112 Encounter for antineoplastic immunotherapy: Secondary | ICD-10-CM | POA: Diagnosis present

## 2021-04-25 DIAGNOSIS — Z9225 Personal history of immunosupression therapy: Secondary | ICD-10-CM | POA: Insufficient documentation

## 2021-04-25 DIAGNOSIS — Z298 Encounter for other specified prophylactic measures: Secondary | ICD-10-CM | POA: Diagnosis not present

## 2021-04-25 DIAGNOSIS — G620 Drug-induced polyneuropathy: Secondary | ICD-10-CM

## 2021-04-25 DIAGNOSIS — Z9481 Bone marrow transplant status: Secondary | ICD-10-CM

## 2021-04-25 LAB — CBC WITH DIFFERENTIAL (CANCER CENTER ONLY)
Abs Immature Granulocytes: 0.01 10*3/uL (ref 0.00–0.07)
Basophils Absolute: 0 10*3/uL (ref 0.0–0.1)
Basophils Relative: 0 %
Eosinophils Absolute: 0.1 10*3/uL (ref 0.0–0.5)
Eosinophils Relative: 2 %
HCT: 34.5 % — ABNORMAL LOW (ref 39.0–52.0)
Hemoglobin: 11.6 g/dL — ABNORMAL LOW (ref 13.0–17.0)
Immature Granulocytes: 0 %
Lymphocytes Relative: 17 %
Lymphs Abs: 1.1 10*3/uL (ref 0.7–4.0)
MCH: 34.4 pg — ABNORMAL HIGH (ref 26.0–34.0)
MCHC: 33.6 g/dL (ref 30.0–36.0)
MCV: 102.4 fL — ABNORMAL HIGH (ref 80.0–100.0)
Monocytes Absolute: 1 10*3/uL (ref 0.1–1.0)
Monocytes Relative: 15 %
Neutro Abs: 4.5 10*3/uL (ref 1.7–7.7)
Neutrophils Relative %: 66 %
Platelet Count: 135 10*3/uL — ABNORMAL LOW (ref 150–400)
RBC: 3.37 MIL/uL — ABNORMAL LOW (ref 4.22–5.81)
RDW: 15.9 % — ABNORMAL HIGH (ref 11.5–15.5)
WBC Count: 6.7 10*3/uL (ref 4.0–10.5)
nRBC: 0 % (ref 0.0–0.2)

## 2021-04-25 LAB — CMP (CANCER CENTER ONLY)
ALT: 13 U/L (ref 0–44)
AST: 21 U/L (ref 15–41)
Albumin: 4 g/dL (ref 3.5–5.0)
Alkaline Phosphatase: 50 U/L (ref 38–126)
Anion gap: 11 (ref 5–15)
BUN: 26 mg/dL — ABNORMAL HIGH (ref 8–23)
CO2: 21 mmol/L — ABNORMAL LOW (ref 22–32)
Calcium: 9.4 mg/dL (ref 8.9–10.3)
Chloride: 101 mmol/L (ref 98–111)
Creatinine: 1.16 mg/dL (ref 0.61–1.24)
GFR, Estimated: >60 mL/min (ref >60)
Glucose, Bld: 89 mg/dL (ref 70–99)
Potassium: 4.2 mmol/L (ref 3.5–5.1)
Sodium: 133 mmol/L — ABNORMAL LOW (ref 135–145)
Total Bilirubin: 0.5 mg/dL (ref 0.3–1.2)
Total Protein: 6.8 g/dL (ref 6.5–8.1)

## 2021-04-25 MED ORDER — LORAZEPAM 0.5 MG PO TABS
0.5000 mg | ORAL_TABLET | Freq: Every evening | ORAL | 0 refills | Status: DC | PRN
  Filled 2021-04-25: qty 20, 20d supply, fill #0

## 2021-04-25 MED ORDER — BORTEZOMIB CHEMO SQ INJECTION 3.5 MG (2.5MG/ML)
1.3000 mg/m2 | Freq: Once | INTRAMUSCULAR | Status: AC
  Administered 2021-04-25: 2.5 mg via SUBCUTANEOUS
  Filled 2021-04-25: qty 1

## 2021-04-25 MED ORDER — DARATUMUMAB-HYALURONIDASE-FIHJ 1800-30000 MG-UT/15ML ~~LOC~~ SOLN
1800.0000 mg | Freq: Once | SUBCUTANEOUS | Status: AC
  Administered 2021-04-25: 1800 mg via SUBCUTANEOUS
  Filled 2021-04-25: qty 15

## 2021-04-25 MED ORDER — DEXAMETHASONE 4 MG PO TABS
10.0000 mg | ORAL_TABLET | Freq: Once | ORAL | Status: AC
  Administered 2021-04-25: 10 mg via ORAL

## 2021-04-25 MED ORDER — ACETAMINOPHEN 325 MG PO TABS
650.0000 mg | ORAL_TABLET | Freq: Once | ORAL | Status: AC
  Administered 2021-04-25: 650 mg via ORAL
  Filled 2021-04-25: qty 2

## 2021-04-25 MED ORDER — DIPHENHYDRAMINE HCL 25 MG PO CAPS
25.0000 mg | ORAL_CAPSULE | Freq: Once | ORAL | Status: AC
  Administered 2021-04-25: 25 mg via ORAL
  Filled 2021-04-25: qty 1

## 2021-04-25 NOTE — Patient Instructions (Addendum)
Andre [name]   Discharge Instructions: Thank you for choosing [facility] to provide your oncology and hematology care.   If you have a lab appointment with the [facility], please go directly to the [facility] and check in at the registration area.   Wear comfortable clothing and clothing appropriate for easy access to any Portacath or PICC line.   We strive to give you quality time with your provider. You may need to reschedule your appointment if you arrive late (15 or more minutes).  Arriving late affects you and other patients whose appointments are after yours.  Also, if you miss three or more appointments without notifying the office, you may be dismissed from the clinic at the provider's discretion.      For prescription refill requests, have your pharmacy contact our office and allow 72 hours for refills to be completed.    Today you received the following chemotherapy and/or immunotherapy agents Daratumumab-hyaluronidase-fihj (DARZALEX FASPRO) & Bortezomib (VELCADE).      To help prevent nausea and vomiting after your treatment, we encourage you to take your nausea medication as directed.  BELOW ARE SYMPTOMS THAT SHOULD BE REPORTED IMMEDIATELY: *FEVER GREATER THAN 100.4 F (38 C) OR HIGHER *CHILLS OR SWEATING *NAUSEA AND VOMITING THAT IS NOT CONTROLLED WITH YOUR NAUSEA MEDICATION *UNUSUAL SHORTNESS OF BREATH *UNUSUAL BRUISING OR BLEEDING *URINARY PROBLEMS (pain or burning when urinating, or frequent urination) *BOWEL PROBLEMS (unusual diarrhea, constipation, pain near the anus) TENDERNESS IN MOUTH AND THROAT WITH OR WITHOUT PRESENCE OF ULCERS (sore throat, sores in mouth, or a toothache) UNUSUAL RASH, SWELLING OR PAIN  UNUSUAL VAGINAL DISCHARGE OR ITCHING   Items with * indicate a potential emergency and should be followed up as soon as possible or go to the Emergency Department if any problems should occur.  Please show the  CHEMOTHERAPY ALERT CARD or IMMUNOTHERAPY ALERT CARD at check-in to the Emergency Department and triage nurse.  Should you have questions after your visit or need to cancel or reschedule your appointment, please contact [facility]  Dept: [phone]  and follow the prompts.  Office hours are 8:00 a.m. to 4:30 p.m. Monday - Friday. Please note that voicemails left after 4:00 p.m. may not be returned until the following business day.  We are closed weekends and major holidays. You have access to a nurse at all times for urgent questions. Please call the main number to the clinic Dept: [phone] and follow the prompts.   For any non-urgent questions, you may also contact your provider using MyChart. We now offer e-Visits for anyone 89 and older to request care online for non-urgent symptoms. For details visit [email or web address].   Also download the MyChart app! Go to the app store, search "MyChart", open the app, select [facility], and log in with your MyChart username and password.  Due to Covid, a mask is required upon entering the hospital/clinic. If you do not have a mask, one will be given to you upon arrival. For doctor visits, patients may have 1 support person aged 29 or older with them. For treatment visits, patients cannot have anyone with them due to current Covid guidelines and our immunocompromised population.   Daratumumab injection What is this medication? DARATUMUMAB (dar a toom ue mab) is a monoclonal antibody. It is used to treatmultiple myeloma. This medicine may be used for other purposes; ask your health care provider orpharmacist if you have questions. COMMON BRAND NAME(S):  DARZALEX What should I tell my care team before I take this medication? They need to know if you have any of these conditions: hereditary fructose intolerance infection (especially a virus infection such as chickenpox, herpes, or hepatitis Tyner virus) lung or breathing  disease (asthma, COPD) an unusual or allergic reaction to daratumumab, sorbitol, other medicines, foods, dyes, or preservatives pregnant or trying to get pregnant breast-feeding How should I use this medication? This medicine is for infusion into a vein. It is given by a health careprofessional in a hospital or clinic setting. Talk to your pediatrician regarding the use of this medicine in children.Special care may be needed. Overdosage: If you think you have taken too much of this medicine contact apoison control center or emergency room at once. NOTE: This medicine is only for you. Do not share this medicine with others. What if I miss a dose? Keep appointments for follow-up doses as directed. It is important not to miss your dose. Call your doctor or health care professional if you are unable tokeep an appointment. What may interact with this medication? Interactions have not been studied. This list may not describe all possible interactions. Give your health care provider a list of all the medicines, herbs, non-prescription drugs, or dietary supplements you use. Also tell them if you smoke, drink alcohol, or use illegaldrugs. Some items may interact with your medicine. What should I watch for while using this medication? Your condition will be monitored carefully while you are receiving thismedicine. This medicine can cause serious allergic reactions. To reduce your risk, your health care provider may give you other medicine to take before receiving thisone. Be sure to follow the directions from your health care provider. This medicine can affect the results of blood tests to match your blood type. These changes can last for up to 6 months after the final dose. Your healthcare provider will do blood tests to match your blood type before you start treatment. Tell all of your healthcare providers that you are being treatedwith this medicine before receiving a blood transfusion. This medicine can  affect the results of some tests used to determine treatmentresponse; extra tests may be needed to evaluate response. Do not become pregnant while taking this medicine or for 3 months after stopping it. Women should inform their health care provider if they wish to become pregnant or think they might be pregnant. There is a potential for serious side effects to an unborn child. Talk to your health care provider formore information. Do not breast-feed an infant while taking this medicine. What side effects may I notice from receiving this medication? Side effects that you should report to your doctor or health care professionalas soon as possible: allergic reactions (skin rash, itching, hives, swelling of the face, lips, or tongue) blurred vision infection (fever, chills, cough, sore throat, pain or difficulty passing urine) infusion reaction (dizziness, fast heartbeat, feeling faint or lightheaded, falls, headache, increase in blood pressure, nausea, vomiting, or wheezing or trouble breathing with loud or whistling sounds) unusual bleeding or bruising Side effects that usually do not require medical attention (report to yourdoctor or health care professional if they continue or are bothersome): constipation diarrhea pain, tingling, numbness in the hands or feet swelling of the ankles, feet, hands tiredness This list may not describe all possible side effects. Call your doctor for medical advice about side effects. You may report side effects to FDA at1-800-FDA-1088. Where should I keep my medication? This drug is given in a hospital  or clinic and will not be stored at home. NOTE: This sheet is a summary. It may not cover all possible information. If you have questions about this medicine, talk to your doctor, pharmacist, orhealth care provider.  2022 Elsevier/Gold Standard (2020-10-14 12:50:38)  Bortezomib injection What is this medication? BORTEZOMIB (bor TEZ oh mib) targets proteins in  cancer cells and stops thecancer cells from growing. It treats multiple myeloma and mantle cell lymphoma. This medicine may be used for other purposes; ask your health care provider orpharmacist if you have questions. COMMON BRAND NAME(S): Velcade What should I tell my care team before I take this medication? They need to know if you have any of these conditions: dehydration diabetes (high blood sugar) heart disease liver disease tingling of the fingers or toes or other nerve disorder an unusual or allergic reaction to bortezomib, mannitol, boron, other medicines, foods, dyes, or preservatives pregnant or trying to get pregnant breast-feeding How should I use this medication? This medicine is injected into a vein or under the skin. It is given by ahealth care provider in a hospital or clinic setting. Talk to your health care provider about the use of this medicine in children.Special care may be needed. Overdosage: If you think you have taken too much of this medicine contact apoison control center or emergency room at once. NOTE: This medicine is only for you. Do not share this medicine with others. What if I miss a dose? Keep appointments for follow-up doses. It is important not to miss your dose.Call your health care provider if you are unable to keep an appointment. What may interact with this medication? This medicine may interact with the following medications: ketoconazole rifampin This list may not describe all possible interactions. Give your health care provider a list of all the medicines, herbs, non-prescription drugs, or dietary supplements you use. Also tell them if you smoke, drink alcohol, or use illegaldrugs. Some items may interact with your medicine. What should I watch for while using this medication? Your condition will be monitored carefully while you are receiving thismedicine. You may need blood work done while you are taking this medicine. You may get drowsy or  dizzy. Do not drive, use machinery, or do anything that needs mental alertness until you know how this medicine affects you. Do not stand up or sit up quickly, especially if you are an older patient. Thisreduces the risk of dizzy or fainting spells This medicine may increase your risk of getting an infection. Call your health care provider for advice if you get a fever, chills, sore throat, or other symptoms of a cold or flu. Do not treat yourself. Try to avoid being aroundpeople who are sick. Check with your health care provider if you have severe diarrhea, nausea, and vomiting, or if you sweat a lot. The loss of too much body fluid may make itdangerous for you to take this medicine. Do not become pregnant while taking this medicine or for 7 months after stopping it. Women should inform their health care provider if they wish to become pregnant or think they might be pregnant. Men should not father a child while taking this medicine and for 4 months after stopping it. There is a potential for serious harm to an unborn child. Talk to your health care provider for more information. Do not breast-feed an infant while taking thismedicine or for 2 months after stopping it. This medicine may make it more difficult to get pregnant or father a child.Talk to  your health care provider if you are concerned about your fertility. What side effects may I notice from receiving this medication? Side effects that you should report to your doctor or health care professionalas soon as possible: allergic reactions (skin rash; itching or hives; swelling of the face, lips, or tongue) bleeding (bloody or black, tarry stools; red or dark brown urine; spitting up blood or brown material that looks like coffee grounds; red spots on the skin; unusual bruising or bleeding from the eye, gums, or nose) blurred vision or changes in vision confusion constipation headache heart failure (trouble breathing; fast, irregular heartbeat;  sudden weight gain; swelling of the ankles, feet, hands) infection (fever, chills, cough, sore throat, pain or trouble passing urine) lack or loss of appetite liver injury (dark yellow or brown urine; general ill feeling or flu-like symptoms; loss of appetite, right upper belly pain; yellowing of the eyes or skin) low blood pressure (dizziness; feeling faint or lightheaded, falls; unusually weak or tired) muscle cramps pain, redness, or irritation at site where injected pain, tingling, numbness in the hands or feet seizures trouble breathing unusual bruising or bleeding Side effects that usually do not require medical attention (report to yourdoctor or health care professional if they continue or are bothersome): diarrhea nausea stomach pain trouble sleeping vomiting This list may not describe all possible side effects. Call your doctor for medical advice about side effects. You may report side effects to FDA at1-800-FDA-1088. Where should I keep my medication? This medicine is given in a hospital or clinic. It will not be stored at home. NOTE: This sheet is a summary. It may not cover all possible information. If you have questions about this medicine, talk to your doctor, pharmacist, orhealth care provider.  2022 Elsevier/Gold Standard (2020-08-26 13:22:53)

## 2021-04-25 NOTE — Progress Notes (Signed)
I connected by phone with Andre [name] on 04/25/2021, 3:27 PM to discuss the potential use of a new treatment, tixagevimab/cilgavimab, for pre-exposure prophylaxis for prevention of coronavirus disease 2019 (COVID-19) caused by the SARS-CoV-2 virus.  This patient is a 70 y.o. male that meets the FDA criteria for Emergency Use Authorization of tixagevimab/cilgavimab for pre-exposure prophylaxis of COVID-19 disease. Pt meets following criteria: Age >12 yr and weight > 40kg Not currently infected with SARS-CoV-2 and has no known recent exposure to an individual infected with SARS-CoV-2 AND Who has moderate to severe immune compromise due to a medical condition or receipt of immunosuppressive medications or treatments and may not mount an adequate immune response to COVID-19 vaccination or  Vaccination with any available COVID-19 vaccine, according to the approved or authorized schedule, is not recommended due to a history of severe adverse reaction (e.g., severe allergic reaction) to a COVID-19 vaccine(s) and/or COVID-19 vaccine component(s).  Patient meets the following definition of mod-severe immune compromised status: 6. Other actively treated hematologic malignancies or severe congenital immunodeficiency syndromes  I have spoken and communicated the following to the patient or parent/caregiver regarding COVID monoclonal antibody treatment:  FDA has authorized the emergency use of tixagevimab/cilgavimab for the pre-exposure prophylaxis of COVID-19 in patients with moderate-severe immunocompromised status, who meet above EUA criteria.  The significant known and potential risks and benefits of COVID monoclonal antibody, and the extent to which such potential risks and benefits are unknown.  Information on available alternative treatments and the risks and benefits of those alternatives, including clinical trials.  The patient or parent/caregiver has the option to accept or refuse COVID monoclonal  antibody treatment.  After reviewing this information with the patient, agree to receive tixagevimab/cilgavimab  Tania Ade, RN, 04/25/2021, 3:27 PM

## 2021-04-25 NOTE — Progress Notes (Signed)
Andre [name] OFFICE PROGRESS NOTE   Diagnosis: Multiple myeloma  INTERVAL HISTORY:   Andre [name] returns as scheduled.  He completed another treatment with Velcade and daratumumab on 04/11/2021.  He reports mild nausea but did not require antiemetics.  No neuropathy symptoms.  He feels well.  He is exercising frequently.  Objective:  Vital signs in last 24 hours:  Blood pressure 134/75, pulse 82, temperature 98 F (36.7 C), temperature source Oral, resp. rate 18, height 5' 9"  (1.753 m), weight 180 lb 3.2 oz (81.7 kg), SpO2 96 %.    HEENT: No thrush or ulcers Resp: Lungs clear bilaterally Cardio: Regular rate and rhythm GI: No hepatosplenomegaly Vascular: Trace pitting edema at the ankles bilaterally  Skin: Resolving ecchymosis/induration at abdominal wall injection sites   Lab Results:  Lab Results  Component Value Date   WBC 6.7 04/25/2021   HGB 11.6 (L) 04/25/2021   HCT 34.5 (L) 04/25/2021   MCV 102.4 (H) 04/25/2021   PLT 135 (L) 04/25/2021   NEUTROABS 4.5 04/25/2021    CMP  Lab Results  Component Value Date   NA 133 (L) 04/25/2021   K 4.2 04/25/2021   CL 101 04/25/2021   CO2 21 (L) 04/25/2021   GLUCOSE 89 04/25/2021   BUN 26 (H) 04/25/2021   CREATININE 1.16 04/25/2021   CALCIUM 9.4 04/25/2021   PROT 6.8 04/25/2021   ALBUMIN 4.0 04/25/2021   AST 21 04/25/2021   ALT 13 04/25/2021   ALKPHOS 50 04/25/2021   BILITOT 0.5 04/25/2021   GFRNONAA >60 04/25/2021   GFRAA >60 06/15/2020    No results found for: CEA1, CEA, CAN199, CA125  Lab Results  Component Value Date   INR 1.07 02/27/2016   LABPROT <157 05/15/2016    Imaging:  No results found.  Medications: I have reviewed the patient's current medications.   Assessment/Plan: Multiple myeloma- IgG lambda  bortezomib (subcutaneously), lenalidomide, and dexamethasone, with repeat bone marrow biopsy May of 2012 showing 10% plasmacytosis   (2) high-dose chemotherapy with BCNU and  melphalan at [facility], followed by stem cell rescue July of 2012   (3) on zoledronic acid started December of 2012, initially monthly, currently given every 3 months, most recent dose  12/07/2015   (4) low-dose lenalidomide resumed April 2013, interrupted several times.  Resumed again on 02/19/2013, ata dose of 5 mg daily, 21 days on and 7 days off, later further reduced to 7 days on, 7 days off   (5) CNS symptoms and abnormal brain MRI extensively evaluated by neurology with no definitive diagnosis established   (6) rising M spike noted June 2014 but did not meet criteria for carfilzomib study   (7) on lenalidomide 25 mg daily, 14 days on, 7 days off, starting 04/18/2013, interrupted December 2014 because of rash;             (a) resumed January 2015 at 10 mg/ day at 21 days on/ 7 days off             (Juanya) starting 08/18/2014 decreased to 10 mg/ day 14 days on, 7 days off because of cytopenias             (c) as of February 2016 was on 5 mg daily 7 days on 7 days off             (d) lenalidomide discontinued December 2016 with evidence of disease progression   (8) transient global amnesia 05/29/2015, resolved without intervention   (9) starting  PVD 10/25/2015 w ASA 325 thromboprophylaxis, valacyclovir prophylaxis, last dose 12/17/2015             (a) pomalidomide 4 mg/d days 1-14             (Kaemon) bortezomib sQ days 2,5,9,12 of each 21 day cycle             (c) dexamethasome 20 mg two days a week             (d) dexamethasone bortezomib and pomalidomide discontinued late December 2018 with poor tolerance   (10) metapneumovirus pneumonia April 2017             (a) completing course of steroids and week of bactrim mid April 2017   (11) status post second autologous transplant at [facility] 02/04/2016(preparatory regimen melphalan 200 mg/m)             (a) received twelve-month vaccinations 03/14/2017 (DPT, Haemophilus, Pneumovax 13, polio)             (Durrel) 14 months injections 05/04/2016 include DTaP,  Hib conjugate, HepB energex Octavious 20 mcg/ml, Prevnar 13             (c) 24 month vaccines due at [facility] June 2019   (12) maintenance therapy started November 2017, consisting of             (a) bortezomib 1.3 mg/M2 every 14 days, first dose 07/27/2016             (Morley) pomalidomide 1 mg days 1-21 Q28 days, started 07/19/2016             (c) zolendronate monthly started 07/27/2016 (previously Q 3 months) however patient unable to tolerate, and changed back to q3 months in April, 2018             (d) Bortezomib changed to monthly as of June 2018 because of tolerance issues, however discontinued after 09/14/2017 dose because of a rise in his M spike             (e) pomalidomide held after 10/18/2017 in preparation for possible study at [place] (venetoclax)--never resumed             (f) with numbers actually improved off treatment, resumed every 2-week bortezomib 12/25/2017             (g) changed to every 3-week bortezomib as of 03/17/2019             (h) briefly on weekly treatments times 22 October 2020 due to increase in M spike             (I) maintenance therapy discontinued with evidence of progression   (13) bortezomib/daratumumab/dexamethasone started 12/20/2020.             (a) Decadron dose dropped to 10 mg day of and day following treatment starting May 2022             (Jams) day 8 bortezomib omitted beginning with the June cycle per patient preference        Disposition: Andre [name] appears stable.  He will continue every 2-week Velcade/daratumumab.  He appears to be tolerating the treatment well.  We will check a myeloma panel when he returns in 2 weeks.  A metastatic bone survey was unchanged compared to 04/05/2020 with widespread lytic lesions.  He agrees to treatment with Evusheld.  We will refer him for Evusheld.  He will be scheduled for an office visit on 05/24/2021.  Betsy Coder,  MD  04/25/2021  10:39 AM

## 2021-04-26 ENCOUNTER — Other Ambulatory Visit: Payer: Self-pay | Admitting: *Deleted

## 2021-04-29 ENCOUNTER — Other Ambulatory Visit: Payer: Self-pay | Admitting: Adult Health

## 2021-04-29 DIAGNOSIS — C9000 Multiple myeloma not having achieved remission: Secondary | ICD-10-CM

## 2021-04-29 NOTE — Progress Notes (Signed)
I connected by phone with Andre [name] on 04/29/2021, 8:04 AM to discuss the potential use of a new treatment, tixagevimab/cilgavimab, for pre-exposure prophylaxis for prevention of coronavirus disease 2019 (COVID-19) caused by the SARS-CoV-2 virus.  This patient is a 70 y.o. male that meets the FDA criteria for Emergency Use Authorization of tixagevimab/cilgavimab for pre-exposure prophylaxis of COVID-19 disease. Pt meets following criteria: Age >12 yr and weight > 40kg Not currently infected with SARS-CoV-2 and has no known recent exposure to an individual infected with SARS-CoV-2 AND Who has moderate to severe immune compromise due to a medical condition or receipt of immunosuppressive medications or treatments and may not mount an adequate immune response to COVID-19 vaccination or  Vaccination with any available COVID-19 vaccine, according to the approved or authorized schedule, is not recommended due to a history of severe adverse reaction (e.g., severe allergic reaction) to a COVID-19 vaccine(s) and/or COVID-19 vaccine component(s).  Patient meets the following definition of mod-severe immune compromised status: 4. Lung transplants, other solid organ transplant recipients on continual belatacept therapy, or multiple myeloma (actively receiving treatment)  I have spoken and communicated the following to the patient or parent/caregiver regarding COVID monoclonal antibody treatment:  FDA has authorized the emergency use of tixagevimab/cilgavimab for the pre-exposure prophylaxis of COVID-19 in patients with moderate-severe immunocompromised status, who meet above EUA criteria.  The significant known and potential risks and benefits of COVID monoclonal antibody, and the extent to which such potential risks and benefits are unknown.  Information on available alternative treatments and the risks and benefits of those alternatives, including clinical trials.  The patient or parent/caregiver has the  option to accept or refuse COVID monoclonal antibody treatment.  After reviewing this information with the patient, agree to receive tixagevimab/cilgavimab  Scot Dock, NP, 04/29/2021, 8:04 AM

## 2021-05-08 ENCOUNTER — Other Ambulatory Visit: Payer: Self-pay | Admitting: Oncology

## 2021-05-09 ENCOUNTER — Other Ambulatory Visit: Payer: Self-pay

## 2021-05-09 ENCOUNTER — Inpatient Hospital Stay: Payer: Medicare Other

## 2021-05-09 VITALS — BP 129/65 | HR 76 | Temp 98.6°F | Resp 17 | Ht 69.0 in | Wt 183.0 lb

## 2021-05-09 DIAGNOSIS — Z5112 Encounter for antineoplastic immunotherapy: Secondary | ICD-10-CM | POA: Diagnosis not present

## 2021-05-09 DIAGNOSIS — C9000 Multiple myeloma not having achieved remission: Secondary | ICD-10-CM

## 2021-05-09 DIAGNOSIS — Z9481 Bone marrow transplant status: Secondary | ICD-10-CM

## 2021-05-09 DIAGNOSIS — T451X5A Adverse effect of antineoplastic and immunosuppressive drugs, initial encounter: Secondary | ICD-10-CM

## 2021-05-09 DIAGNOSIS — G620 Drug-induced polyneuropathy: Secondary | ICD-10-CM

## 2021-05-09 LAB — CBC WITH DIFFERENTIAL (CANCER CENTER ONLY)
Abs Immature Granulocytes: 0.02 10*3/uL (ref 0.00–0.07)
Basophils Absolute: 0 10*3/uL (ref 0.0–0.1)
Basophils Relative: 0 %
Eosinophils Absolute: 0.1 10*3/uL (ref 0.0–0.5)
Eosinophils Relative: 2 %
HCT: 32.5 % — ABNORMAL LOW (ref 39.0–52.0)
Hemoglobin: 10.8 g/dL — ABNORMAL LOW (ref 13.0–17.0)
Immature Granulocytes: 0 %
Lymphocytes Relative: 12 %
Lymphs Abs: 0.8 10*3/uL (ref 0.7–4.0)
MCH: 35 pg — ABNORMAL HIGH (ref 26.0–34.0)
MCHC: 33.2 g/dL (ref 30.0–36.0)
MCV: 105.2 fL — ABNORMAL HIGH (ref 80.0–100.0)
Monocytes Absolute: 0.7 10*3/uL (ref 0.1–1.0)
Monocytes Relative: 11 %
Neutro Abs: 5 10*3/uL (ref 1.7–7.7)
Neutrophils Relative %: 75 %
Platelet Count: 110 10*3/uL — ABNORMAL LOW (ref 150–400)
RBC: 3.09 MIL/uL — ABNORMAL LOW (ref 4.22–5.81)
RDW: 15.7 % — ABNORMAL HIGH (ref 11.5–15.5)
WBC Count: 6.6 10*3/uL (ref 4.0–10.5)
nRBC: 0 % (ref 0.0–0.2)

## 2021-05-09 LAB — CMP (CANCER CENTER ONLY)
ALT: 17 U/L (ref 0–44)
AST: 23 U/L (ref 15–41)
Albumin: 3.8 g/dL (ref 3.5–5.0)
Alkaline Phosphatase: 50 U/L (ref 38–126)
Anion gap: 7 (ref 5–15)
BUN: 26 mg/dL — ABNORMAL HIGH (ref 8–23)
CO2: 26 mmol/L (ref 22–32)
Calcium: 9.5 mg/dL (ref 8.9–10.3)
Chloride: 99 mmol/L (ref 98–111)
Creatinine: 1.16 mg/dL (ref 0.61–1.24)
GFR, Estimated: >60 mL/min (ref >60)
Glucose, Bld: 78 mg/dL (ref 70–99)
Potassium: 4.9 mmol/L (ref 3.5–5.1)
Sodium: 132 mmol/L — ABNORMAL LOW (ref 135–145)
Total Bilirubin: 0.3 mg/dL (ref 0.3–1.2)
Total Protein: 6.4 g/dL — ABNORMAL LOW (ref 6.5–8.1)

## 2021-05-09 MED ORDER — BORTEZOMIB CHEMO SQ INJECTION 3.5 MG (2.5MG/ML)
1.3000 mg/m2 | Freq: Once | INTRAMUSCULAR | Status: AC
  Administered 2021-05-09: 2.5 mg via SUBCUTANEOUS
  Filled 2021-05-09: qty 1

## 2021-05-09 MED ORDER — TIXAGEVIMAB (PART OF EVUSHELD) INJECTION
300.0000 mg | Freq: Once | INTRAMUSCULAR | Status: AC
  Administered 2021-05-09: 300 mg via INTRAMUSCULAR

## 2021-05-09 MED ORDER — DEXAMETHASONE 4 MG PO TABS
10.0000 mg | ORAL_TABLET | Freq: Once | ORAL | Status: AC
  Administered 2021-05-09: 10 mg via ORAL

## 2021-05-09 MED ORDER — CILGAVIMAB (PART OF EVUSHELD) INJECTION
300.0000 mg | Freq: Once | INTRAMUSCULAR | Status: AC
  Administered 2021-05-09: 300 mg via INTRAMUSCULAR

## 2021-05-09 NOTE — Progress Notes (Signed)
Ok to treat without CMP result per [name].

## 2021-05-09 NOTE — Patient Instructions (Signed)
Bortezomib injection What is this medication? BORTEZOMIB (bor TEZ oh mib) targets proteins in cancer cells and stops thecancer cells from growing. It treats multiple myeloma and mantle cell lymphoma. This medicine may be used for other purposes; ask your health care provider orpharmacist if you have questions. COMMON BRAND NAME(S): Velcade What should I tell my care team before I take this medication? They need to know if you have any of these conditions: dehydration diabetes (high blood sugar) heart disease liver disease tingling of the fingers or toes or other nerve disorder an unusual or allergic reaction to bortezomib, mannitol, boron, other medicines, foods, dyes, or preservatives pregnant or trying to get pregnant breast-feeding How should I use this medication? This medicine is injected into a vein or under the skin. It is given by ahealth care provider in a hospital or clinic setting. Talk to your health care provider about the use of this medicine in children.Special care may be needed. Overdosage: If you think you have taken too much of this medicine contact apoison control center or emergency room at once. NOTE: This medicine is only for you. Do not share this medicine with others. What if I miss a dose? Keep appointments for follow-up doses. It is important not to miss your dose.Call your health care provider if you are unable to keep an appointment. What may interact with this medication? This medicine may interact with the following medications: ketoconazole rifampin This list may not describe all possible interactions. Give your health care provider a list of all the medicines, herbs, non-prescription drugs, or dietary supplements you use. Also tell them if you smoke, drink alcohol, or use illegaldrugs. Some items may interact with your medicine. What should I watch for while using this medication? Your condition will be monitored carefully while you are receiving  thismedicine. You may need blood work done while you are taking this medicine. You may get drowsy or dizzy. Do not drive, use machinery, or do anything that needs mental alertness until you know how this medicine affects you. Do not stand up or sit up quickly, especially if you are an older patient. Thisreduces the risk of dizzy or fainting spells This medicine may increase your risk of getting an infection. Call your health care provider for advice if you get a fever, chills, sore throat, or other symptoms of a cold or flu. Do not treat yourself. Try to avoid being aroundpeople who are sick. Check with your health care provider if you have severe diarrhea, nausea, and vomiting, or if you sweat a lot. The loss of too much body fluid may make itdangerous for you to take this medicine. Do not become pregnant while taking this medicine or for 7 months after stopping it. Women should inform their health care provider if they wish to become pregnant or think they might be pregnant. Men should not father a child while taking this medicine and for 4 months after stopping it. There is a potential for serious harm to an unborn child. Talk to your health care provider for more information. Do not breast-feed an infant while taking thismedicine or for 2 months after stopping it. This medicine may make it more difficult to get pregnant or father a child.Talk to your health care provider if you are concerned about your fertility. What side effects may I notice from receiving this medication? Side effects that you should report to your doctor or health care professionalas soon as possible: allergic reactions (skin rash; itching or hives; swelling   of the face, lips, or tongue) bleeding (bloody or black, tarry stools; red or dark brown urine; spitting up blood or brown material that looks like coffee grounds; red spots on the skin; unusual bruising or bleeding from the eye, gums, or nose) blurred vision or changes in  vision confusion constipation headache heart failure (trouble breathing; fast, irregular heartbeat; sudden weight gain; swelling of the ankles, feet, hands) infection (fever, chills, cough, sore throat, pain or trouble passing urine) lack or loss of appetite liver injury (dark yellow or brown urine; general ill feeling or flu-like symptoms; loss of appetite, right upper belly pain; yellowing of the eyes or skin) low blood pressure (dizziness; feeling faint or lightheaded, falls; unusually weak or tired) muscle cramps pain, redness, or irritation at site where injected pain, tingling, numbness in the hands or feet seizures trouble breathing unusual bruising or bleeding Side effects that usually do not require medical attention (report to yourdoctor or health care professional if they continue or are bothersome): diarrhea nausea stomach pain trouble sleeping vomiting This list may not describe all possible side effects. Call your doctor for medical advice about side effects. You may report side effects to FDA at1-800-FDA-1088. Where should I keep my medication? This medicine is given in a hospital or clinic. It will not be stored at home. NOTE: This sheet is a summary. It may not cover all possible information. If you have questions about this medicine, talk to your doctor, pharmacist, orhealth care provider.  2022 Elsevier/Gold Standard (2020-08-26 13:22:53)

## 2021-05-11 LAB — KAPPA/LAMBDA LIGHT CHAINS
Kappa free light chain: 8.4 mg/L (ref 3.3–19.4)
Kappa, lambda light chain ratio: 0.28 (ref 0.26–1.65)
Lambda free light chains: 29.7 mg/L — ABNORMAL HIGH (ref 5.7–26.3)

## 2021-05-11 LAB — IGG: IgG (Immunoglobin G), Serum: 691 mg/dL (ref 603–1613)

## 2021-05-12 LAB — PROTEIN ELECTROPHORESIS, SERUM
A/G Ratio: 1.7 (ref 0.7–1.7)
Albumin ELP: 3.8 g/dL (ref 2.9–4.4)
Alpha-1-Globulin: 0.2 g/dL (ref 0.0–0.4)
Alpha-2-Globulin: 0.7 g/dL (ref 0.4–1.0)
Beta Globulin: 0.9 g/dL (ref 0.7–1.3)
Gamma Globulin: 0.6 g/dL (ref 0.4–1.8)
Globulin, Total: 2.3 g/dL (ref 2.2–3.9)
M-Spike, %: 0.3 g/dL — ABNORMAL HIGH
Total Protein ELP: 6.1 g/dL (ref 6.0–8.5)

## 2021-05-16 ENCOUNTER — Other Ambulatory Visit (HOSPITAL_BASED_OUTPATIENT_CLINIC_OR_DEPARTMENT_OTHER): Payer: Self-pay

## 2021-05-16 MED ORDER — DEXAMETHASONE 4 MG PO TABS
ORAL_TABLET | ORAL | 8 refills | Status: DC
  Filled 2021-05-16: qty 20, 21d supply, fill #0

## 2021-05-16 MED ORDER — VALACYCLOVIR HCL 1 G PO TABS
ORAL_TABLET | ORAL | 2 refills | Status: DC
  Filled 2021-05-16 – 2021-07-06 (×2): qty 90, 90d supply, fill #0

## 2021-05-16 MED ORDER — DEXAMETHASONE 4 MG PO TABS
ORAL_TABLET | ORAL | 1 refills | Status: DC
  Filled 2021-05-16: qty 60, 12d supply, fill #0

## 2021-05-16 MED ORDER — ACYCLOVIR 400 MG PO TABS
ORAL_TABLET | ORAL | 5 refills | Status: DC
  Filled 2021-05-16: qty 60, 30d supply, fill #0

## 2021-05-16 MED FILL — Ondansetron HCl Tab 8 MG: ORAL | 6 days supply | Qty: 20 | Fill #0 | Status: CN

## 2021-05-16 MED FILL — Pantoprazole Sodium EC Tab 40 MG (Base Equiv): ORAL | 90 days supply | Qty: 180 | Fill #0 | Status: AC

## 2021-05-16 MED FILL — Valacyclovir HCl Tab 1 GM: ORAL | 90 days supply | Qty: 90 | Fill #0 | Status: CN

## 2021-05-17 ENCOUNTER — Other Ambulatory Visit (HOSPITAL_BASED_OUTPATIENT_CLINIC_OR_DEPARTMENT_OTHER): Payer: Self-pay

## 2021-05-18 ENCOUNTER — Other Ambulatory Visit (HOSPITAL_BASED_OUTPATIENT_CLINIC_OR_DEPARTMENT_OTHER): Payer: Self-pay

## 2021-05-19 ENCOUNTER — Encounter: Payer: Self-pay | Admitting: Oncology

## 2021-05-19 ENCOUNTER — Other Ambulatory Visit (HOSPITAL_BASED_OUTPATIENT_CLINIC_OR_DEPARTMENT_OTHER): Payer: Self-pay

## 2021-05-19 MED FILL — Levothyroxine Sodium Tab 150 MCG: ORAL | 90 days supply | Qty: 90 | Fill #0 | Status: AC

## 2021-05-24 ENCOUNTER — Inpatient Hospital Stay: Payer: Medicare Other

## 2021-05-24 ENCOUNTER — Other Ambulatory Visit: Payer: Self-pay

## 2021-05-24 ENCOUNTER — Inpatient Hospital Stay (HOSPITAL_BASED_OUTPATIENT_CLINIC_OR_DEPARTMENT_OTHER): Payer: Medicare Other | Admitting: Nurse Practitioner

## 2021-05-24 ENCOUNTER — Inpatient Hospital Stay: Payer: Medicare Other | Attending: Oncology

## 2021-05-24 ENCOUNTER — Encounter: Payer: Self-pay | Admitting: Nurse Practitioner

## 2021-05-24 VITALS — BP 127/76 | HR 90 | Temp 98.2°F | Resp 20 | Ht 69.0 in | Wt 180.4 lb

## 2021-05-24 DIAGNOSIS — Z79899 Other long term (current) drug therapy: Secondary | ICD-10-CM | POA: Insufficient documentation

## 2021-05-24 DIAGNOSIS — G620 Drug-induced polyneuropathy: Secondary | ICD-10-CM

## 2021-05-24 DIAGNOSIS — Z5112 Encounter for antineoplastic immunotherapy: Secondary | ICD-10-CM | POA: Insufficient documentation

## 2021-05-24 DIAGNOSIS — Z9481 Bone marrow transplant status: Secondary | ICD-10-CM

## 2021-05-24 DIAGNOSIS — C9000 Multiple myeloma not having achieved remission: Secondary | ICD-10-CM | POA: Insufficient documentation

## 2021-05-24 DIAGNOSIS — T451X5A Adverse effect of antineoplastic and immunosuppressive drugs, initial encounter: Secondary | ICD-10-CM

## 2021-05-24 LAB — CBC WITH DIFFERENTIAL (CANCER CENTER ONLY)
Abs Immature Granulocytes: 0.01 10*3/uL (ref 0.00–0.07)
Basophils Absolute: 0 10*3/uL (ref 0.0–0.1)
Basophils Relative: 0 %
Eosinophils Absolute: 0.1 10*3/uL (ref 0.0–0.5)
Eosinophils Relative: 2 %
HCT: 34.6 % — ABNORMAL LOW (ref 39.0–52.0)
Hemoglobin: 11.3 g/dL — ABNORMAL LOW (ref 13.0–17.0)
Immature Granulocytes: 0 %
Lymphocytes Relative: 19 %
Lymphs Abs: 1.2 10*3/uL (ref 0.7–4.0)
MCH: 34.1 pg — ABNORMAL HIGH (ref 26.0–34.0)
MCHC: 32.7 g/dL (ref 30.0–36.0)
MCV: 104.5 fL — ABNORMAL HIGH (ref 80.0–100.0)
Monocytes Absolute: 1 10*3/uL (ref 0.1–1.0)
Monocytes Relative: 15 %
Neutro Abs: 4.2 10*3/uL (ref 1.7–7.7)
Neutrophils Relative %: 64 %
Platelet Count: 113 10*3/uL — ABNORMAL LOW (ref 150–400)
RBC: 3.31 MIL/uL — ABNORMAL LOW (ref 4.22–5.81)
RDW: 15.3 % (ref 11.5–15.5)
WBC Count: 6.6 10*3/uL (ref 4.0–10.5)
nRBC: 0 % (ref 0.0–0.2)

## 2021-05-24 LAB — CMP (CANCER CENTER ONLY)
ALT: 13 U/L (ref 0–44)
AST: 20 U/L (ref 15–41)
Albumin: 3.9 g/dL (ref 3.5–5.0)
Alkaline Phosphatase: 45 U/L (ref 38–126)
Anion gap: 8 (ref 5–15)
BUN: 25 mg/dL — ABNORMAL HIGH (ref 8–23)
CO2: 24 mmol/L (ref 22–32)
Calcium: 9.5 mg/dL (ref 8.9–10.3)
Chloride: 104 mmol/L (ref 98–111)
Creatinine: 1.06 mg/dL (ref 0.61–1.24)
GFR, Estimated: >60 mL/min (ref >60)
Glucose, Bld: 87 mg/dL (ref 70–99)
Potassium: 4.5 mmol/L (ref 3.5–5.1)
Sodium: 136 mmol/L (ref 135–145)
Total Bilirubin: 0.4 mg/dL (ref 0.3–1.2)
Total Protein: 6.7 g/dL (ref 6.5–8.1)

## 2021-05-24 MED ORDER — DIPHENHYDRAMINE HCL 25 MG PO CAPS
25.0000 mg | ORAL_CAPSULE | Freq: Once | ORAL | Status: AC
  Administered 2021-05-24: 25 mg via ORAL
  Filled 2021-05-24: qty 1

## 2021-05-24 MED ORDER — BORTEZOMIB CHEMO SQ INJECTION 3.5 MG (2.5MG/ML)
1.3000 mg/m2 | Freq: Once | INTRAMUSCULAR | Status: AC
  Administered 2021-05-24: 2.5 mg via SUBCUTANEOUS
  Filled 2021-05-24: qty 1

## 2021-05-24 MED ORDER — ACETAMINOPHEN 325 MG PO TABS
650.0000 mg | ORAL_TABLET | Freq: Once | ORAL | Status: AC
  Administered 2021-05-24: 650 mg via ORAL
  Filled 2021-05-24: qty 2

## 2021-05-24 MED ORDER — DARATUMUMAB-HYALURONIDASE-FIHJ 1800-30000 MG-UT/15ML ~~LOC~~ SOLN
1800.0000 mg | Freq: Once | SUBCUTANEOUS | Status: AC
  Administered 2021-05-24: 1800 mg via SUBCUTANEOUS
  Filled 2021-05-24: qty 15

## 2021-05-24 MED ORDER — DEXAMETHASONE 4 MG PO TABS
10.0000 mg | ORAL_TABLET | Freq: Once | ORAL | Status: AC
  Administered 2021-05-24: 10 mg via ORAL

## 2021-05-24 NOTE — Progress Notes (Signed)
[place] [facility] OFFICE PROGRESS NOTE   Diagnosis: Multiple myeloma  INTERVAL HISTORY:   Mr. Solis returns as scheduled.  He completed a treatment with daratumumab 04/25/2021.  He completed a treatment with Velcade 05/09/2021.  He has occasional mild nausea.  No mouth sores.  No diarrhea.  No cough or shortness of breath.  Objective:  Vital signs in last 24 hours:  Blood pressure 127/76, pulse 90, temperature 98.2 F (36.8 C), temperature source Oral, resp. rate 20, height _0  (1.753 m), weight 180 lb 6.4 oz (81.8 kg), SpO2 100 %.    HEENT: No thrush or ulcers. Resp: Lungs clear bilaterally. Cardio: Regular rate and rhythm. GI: Abdomen soft and nontender.  No hepatomegaly.  No splenomegaly. Vascular: Trace pitting edema ankles bilaterally. Skin: Resolving ecchymoses abdominal wall injection sites.   Lab Results:  Lab Results  Component Value Date   WBC 6.6 05/24/2021   HGB 11.3 (L) 05/24/2021   HCT 34.6 (L) 05/24/2021   MCV 104.5 (H) 05/24/2021   PLT 113 (L) 05/24/2021   NEUTROABS 4.2 05/24/2021    Imaging:  No results found.  Medications: I have reviewed the patient's current medications.  Assessment/Plan: Multiple myeloma- IgG lambda  bortezomib (subcutaneously), lenalidomide, and dexamethasone, with repeat bone marrow biopsy May of 2012 showing 10% plasmacytosis   (2) high-dose chemotherapy with BCNU and melphalan at [facility], followed by stem cell rescue July of 2012   (3) on zoledronic acid started December of 2012, initially monthly, currently given every 3 months, most recent dose  12/07/2015   (4) low-dose lenalidomide resumed April 2013, interrupted several times.  Resumed again on 02/19/2013, ata dose of 5 mg daily, 21 days on and 7 days off, later further reduced to 7 days on, 7 days off   (5) CNS symptoms and abnormal brain MRI extensively evaluated by neurology with no definitive diagnosis established   (6) rising M spike noted June 2014 but  did not meet criteria for carfilzomib study   (7) on lenalidomide 25 mg daily, 14 days on, 7 days off, starting 04/18/2013, interrupted December 2014 because of rash;             (a) resumed January 2015 at 10 mg/ day at 21 days on/ 7 days off             (Anwar) starting 08/18/2014 decreased to 10 mg/ day 14 days on, 7 days off because of cytopenias             (c) as of February 2016 was on 5 mg daily 7 days on 7 days off             (d) lenalidomide discontinued December 2016 with evidence of disease progression   (8) transient global amnesia 05/29/2015, resolved without intervention   (9) starting PVD 10/25/2015 w ASA 325 thromboprophylaxis, valacyclovir prophylaxis, last dose 12/17/2015             (a) pomalidomide 4 mg/d days 1-14             (Hovanes) bortezomib sQ days 2,5,9,12 of each 21 day cycle             (c) dexamethasome 20 mg two days a week             (d) dexamethasone bortezomib and pomalidomide discontinued late December 2018 with poor tolerance   (10) metapneumovirus pneumonia April 2017             (a) completing course  of steroids and week of bactrim mid April 2017   (11) status post second autologous transplant at [facility] 02/04/2016(preparatory regimen melphalan 200 mg/m)             (a) received twelve-month vaccinations 03/14/2017 (DPT, Haemophilus, Pneumovax 13, polio)             (Elmar) 14 months injections 05/04/2016 include DTaP, Hib conjugate, HepB energex Gabe 20 mcg/ml, Prevnar 13             (c) 24 month vaccines due at [facility] June 2019   (12) maintenance therapy started November 2017, consisting of             (a) bortezomib 1.3 mg/M2 every 14 days, first dose 07/27/2016             (Khyler) pomalidomide 1 mg days 1-21 Q28 days, started 07/19/2016             (c) zolendronate monthly started 07/27/2016 (previously Q 3 months) however patient unable to tolerate, and changed back to q3 months in April, 2018             (d) Bortezomib changed to monthly as of June 2018 because of  tolerance issues, however discontinued after 09/14/2017 dose because of a rise in his M spike             (e) pomalidomide held after 10/18/2017 in preparation for possible study at [place] (venetoclax)--never resumed             (f) with numbers actually improved off treatment, resumed every 2-week bortezomib 12/25/2017             (g) changed to every 3-week bortezomib as of 03/17/2019             (h) briefly on weekly treatments times 22 October 2020 due to increase in M spike             (I) maintenance therapy discontinued with evidence of progression   (13) bortezomib/daratumumab/dexamethasone started 12/20/2020.             (a) Decadron dose dropped to 10 mg day of and day following treatment starting May 2022             (Aceson) day 8 bortezomib omitted beginning with the June cycle per patient preference    Disposition: Mr. Zapf appears stable.  He continues Velcade every 2 weeks, daratumumab is now every 4 weeks.  He will receive both today.  We reviewed the myeloma panel from 05/09/2021.  Labs are stable.  We reviewed the CBC and chemistry panel from today.  Labs adequate to proceed as above.  He will return for lab and Velcade in 2 weeks.  He will return for lab, follow-up, Velcade and daratumumab in 4 weeks.  We are available to see him sooner if needed.  Plan reviewed with Dr. Benay Spice.    [name] ANP/GNP-BC   05/24/2021  11:26 AM

## 2021-05-24 NOTE — Patient Instructions (Signed)
Andre [name]   Discharge Instructions: Thank you for choosing [facility] to provide your oncology and hematology care.   If you have a lab appointment with the [facility], please go directly to the [facility] and check in at the registration area.   Wear comfortable clothing and clothing appropriate for easy access to any Portacath or PICC line.   We strive to give you quality time with your provider. You may need to reschedule your appointment if you arrive late (15 or more minutes).  Arriving late affects you and other patients whose appointments are after yours.  Also, if you miss three or more appointments without notifying the office, you may be dismissed from the clinic at the provider's discretion.      For prescription refill requests, have your pharmacy contact our office and allow 72 hours for refills to be completed.    Today you received the following chemotherapy and/or immunotherapy agents Daratumumab-hyaluronidase-fihj (DARZALEX FASPRO) & Bortezomib (VELCADE).      To help prevent nausea and vomiting after your treatment, we encourage you to take your nausea medication as directed.  BELOW ARE SYMPTOMS THAT SHOULD BE REPORTED IMMEDIATELY: *FEVER GREATER THAN 100.4 F (38 C) OR HIGHER *CHILLS OR SWEATING *NAUSEA AND VOMITING THAT IS NOT CONTROLLED WITH YOUR NAUSEA MEDICATION *UNUSUAL SHORTNESS OF BREATH *UNUSUAL BRUISING OR BLEEDING *URINARY PROBLEMS (pain or burning when urinating, or frequent urination) *BOWEL PROBLEMS (unusual diarrhea, constipation, pain near the anus) TENDERNESS IN MOUTH AND THROAT WITH OR WITHOUT PRESENCE OF ULCERS (sore throat, sores in mouth, or a toothache) UNUSUAL RASH, SWELLING OR PAIN  UNUSUAL VAGINAL DISCHARGE OR ITCHING   Items with * indicate a potential emergency and should be followed up as soon as possible or go to the Emergency Department if any problems should occur.  Please show the  CHEMOTHERAPY ALERT CARD or IMMUNOTHERAPY ALERT CARD at check-in to the Emergency Department and triage nurse.  Should you have questions after your visit or need to cancel or reschedule your appointment, please contact [facility]  Dept: [phone]  and follow the prompts.  Office hours are 8:00 a.m. to 4:30 p.m. Monday - Friday. Please note that voicemails left after 4:00 p.m. may not be returned until the following business day.  We are closed weekends and major holidays. You have access to a nurse at all times for urgent questions. Please call the main number to the clinic Dept: [phone] and follow the prompts.   For any non-urgent questions, you may also contact your provider using MyChart. We now offer e-Visits for anyone 72 and older to request care online for non-urgent symptoms. For details visit [email or web address].   Also download the MyChart app! Go to the app store, search "MyChart", open the app, select [facility], and log in with your MyChart username and password.  Due to Covid, a mask is required upon entering the hospital/clinic. If you do not have a mask, one will be given to you upon arrival. For doctor visits, patients may have 1 support person aged 72 or older with them. For treatment visits, patients cannot have anyone with them due to current Covid guidelines and our immunocompromised population.   Daratumumab injection What is this medication? DARATUMUMAB (dar a toom ue mab) is a monoclonal antibody. It is used to treatmultiple myeloma. This medicine may be used for other purposes; ask your health care provider orpharmacist if you have questions. COMMON BRAND NAME(S):  DARZALEX What should I tell my care team before I take this medication? They need to know if you have any of these conditions: hereditary fructose intolerance infection (especially a virus infection such as chickenpox, herpes, or hepatitis Tyner virus) lung or breathing  disease (asthma, COPD) an unusual or allergic reaction to daratumumab, sorbitol, other medicines, foods, dyes, or preservatives pregnant or trying to get pregnant breast-feeding How should I use this medication? This medicine is for infusion into a vein. It is given by a health careprofessional in a hospital or clinic setting. Talk to your pediatrician regarding the use of this medicine in children.Special care may be needed. Overdosage: If you think you have taken too much of this medicine contact apoison control center or emergency room at once. NOTE: This medicine is only for you. Do not share this medicine with others. What if I miss a dose? Keep appointments for follow-up doses as directed. It is important not to miss your dose. Call your doctor or health care professional if you are unable tokeep an appointment. What may interact with this medication? Interactions have not been studied. This list may not describe all possible interactions. Give your health care provider a list of all the medicines, herbs, non-prescription drugs, or dietary supplements you use. Also tell them if you smoke, drink alcohol, or use illegaldrugs. Some items may interact with your medicine. What should I watch for while using this medication? Your condition will be monitored carefully while you are receiving thismedicine. This medicine can cause serious allergic reactions. To reduce your risk, your health care provider may give you other medicine to take before receiving thisone. Be sure to follow the directions from your health care provider. This medicine can affect the results of blood tests to match your blood type. These changes can last for up to 6 months after the final dose. Your healthcare provider will do blood tests to match your blood type before you start treatment. Tell all of your healthcare providers that you are being treatedwith this medicine before receiving a blood transfusion. This medicine can  affect the results of some tests used to determine treatmentresponse; extra tests may be needed to evaluate response. Do not become pregnant while taking this medicine or for 3 months after stopping it. Women should inform their health care provider if they wish to become pregnant or think they might be pregnant. There is a potential for serious side effects to an unborn child. Talk to your health care provider formore information. Do not breast-feed an infant while taking this medicine. What side effects may I notice from receiving this medication? Side effects that you should report to your doctor or health care professionalas soon as possible: allergic reactions (skin rash, itching, hives, swelling of the face, lips, or tongue) blurred vision infection (fever, chills, cough, sore throat, pain or difficulty passing urine) infusion reaction (dizziness, fast heartbeat, feeling faint or lightheaded, falls, headache, increase in blood pressure, nausea, vomiting, or wheezing or trouble breathing with loud or whistling sounds) unusual bleeding or bruising Side effects that usually do not require medical attention (report to yourdoctor or health care professional if they continue or are bothersome): constipation diarrhea pain, tingling, numbness in the hands or feet swelling of the ankles, feet, hands tiredness This list may not describe all possible side effects. Call your doctor for medical advice about side effects. You may report side effects to FDA at1-800-FDA-1088. Where should I keep my medication? This drug is given in a hospital  or clinic and will not be stored at home. NOTE: This sheet is a summary. It may not cover all possible information. If you have questions about this medicine, talk to your doctor, pharmacist, orhealth care provider.  2022 Elsevier/Gold Standard (2020-10-14 12:50:38)  Bortezomib injection What is this medication? BORTEZOMIB (bor TEZ oh mib) targets proteins in  cancer cells and stops thecancer cells from growing. It treats multiple myeloma and mantle cell lymphoma. This medicine may be used for other purposes; ask your health care provider orpharmacist if you have questions. COMMON BRAND NAME(S): Velcade What should I tell my care team before I take this medication? They need to know if you have any of these conditions: dehydration diabetes (high blood sugar) heart disease liver disease tingling of the fingers or toes or other nerve disorder an unusual or allergic reaction to bortezomib, mannitol, boron, other medicines, foods, dyes, or preservatives pregnant or trying to get pregnant breast-feeding How should I use this medication? This medicine is injected into a vein or under the skin. It is given by ahealth care provider in a hospital or clinic setting. Talk to your health care provider about the use of this medicine in children.Special care may be needed. Overdosage: If you think you have taken too much of this medicine contact apoison control center or emergency room at once. NOTE: This medicine is only for you. Do not share this medicine with others. What if I miss a dose? Keep appointments for follow-up doses. It is important not to miss your dose.Call your health care provider if you are unable to keep an appointment. What may interact with this medication? This medicine may interact with the following medications: ketoconazole rifampin This list may not describe all possible interactions. Give your health care provider a list of all the medicines, herbs, non-prescription drugs, or dietary supplements you use. Also tell them if you smoke, drink alcohol, or use illegaldrugs. Some items may interact with your medicine. What should I watch for while using this medication? Your condition will be monitored carefully while you are receiving thismedicine. You may need blood work done while you are taking this medicine. You may get drowsy or  dizzy. Do not drive, use machinery, or do anything that needs mental alertness until you know how this medicine affects you. Do not stand up or sit up quickly, especially if you are an older patient. Thisreduces the risk of dizzy or fainting spells This medicine may increase your risk of getting an infection. Call your health care provider for advice if you get a fever, chills, sore throat, or other symptoms of a cold or flu. Do not treat yourself. Try to avoid being aroundpeople who are sick. Check with your health care provider if you have severe diarrhea, nausea, and vomiting, or if you sweat a lot. The loss of too much body fluid may make itdangerous for you to take this medicine. Do not become pregnant while taking this medicine or for 7 months after stopping it. Women should inform their health care provider if they wish to become pregnant or think they might be pregnant. Men should not father a child while taking this medicine and for 4 months after stopping it. There is a potential for serious harm to an unborn child. Talk to your health care provider for more information. Do not breast-feed an infant while taking thismedicine or for 2 months after stopping it. This medicine may make it more difficult to get pregnant or father a child.Talk to  your health care provider if you are concerned about your fertility. What side effects may I notice from receiving this medication? Side effects that you should report to your doctor or health care professionalas soon as possible: allergic reactions (skin rash; itching or hives; swelling of the face, lips, or tongue) bleeding (bloody or black, tarry stools; red or dark brown urine; spitting up blood or brown material that looks like coffee grounds; red spots on the skin; unusual bruising or bleeding from the eye, gums, or nose) blurred vision or changes in vision confusion constipation headache heart failure (trouble breathing; fast, irregular heartbeat;  sudden weight gain; swelling of the ankles, feet, hands) infection (fever, chills, cough, sore throat, pain or trouble passing urine) lack or loss of appetite liver injury (dark yellow or brown urine; general ill feeling or flu-like symptoms; loss of appetite, right upper belly pain; yellowing of the eyes or skin) low blood pressure (dizziness; feeling faint or lightheaded, falls; unusually weak or tired) muscle cramps pain, redness, or irritation at site where injected pain, tingling, numbness in the hands or feet seizures trouble breathing unusual bruising or bleeding Side effects that usually do not require medical attention (report to yourdoctor or health care professional if they continue or are bothersome): diarrhea nausea stomach pain trouble sleeping vomiting This list may not describe all possible side effects. Call your doctor for medical advice about side effects. You may report side effects to FDA at1-800-FDA-1088. Where should I keep my medication? This medicine is given in a hospital or clinic. It will not be stored at home. NOTE: This sheet is a summary. It may not cover all possible information. If you have questions about this medicine, talk to your doctor, pharmacist, orhealth care provider.  2022 Elsevier/Gold Standard (2020-08-26 13:22:53)

## 2021-05-31 ENCOUNTER — Inpatient Hospital Stay: Payer: Medicare Other

## 2021-06-06 ENCOUNTER — Other Ambulatory Visit (HOSPITAL_BASED_OUTPATIENT_CLINIC_OR_DEPARTMENT_OTHER): Payer: Self-pay

## 2021-06-06 ENCOUNTER — Encounter: Payer: Self-pay | Admitting: Oncology

## 2021-06-06 ENCOUNTER — Ambulatory Visit: Payer: Medicare Other

## 2021-06-06 ENCOUNTER — Other Ambulatory Visit: Payer: Medicare Other

## 2021-06-07 ENCOUNTER — Inpatient Hospital Stay: Payer: Medicare Other

## 2021-06-12 ENCOUNTER — Other Ambulatory Visit: Payer: Self-pay | Admitting: Oncology

## 2021-06-13 ENCOUNTER — Inpatient Hospital Stay: Payer: Medicare Other

## 2021-06-13 ENCOUNTER — Other Ambulatory Visit: Payer: Self-pay

## 2021-06-13 VITALS — BP 124/60 | HR 55 | Temp 98.7°F | Resp 18 | Ht 69.0 in | Wt 180.0 lb

## 2021-06-13 DIAGNOSIS — G620 Drug-induced polyneuropathy: Secondary | ICD-10-CM

## 2021-06-13 DIAGNOSIS — C9000 Multiple myeloma not having achieved remission: Secondary | ICD-10-CM

## 2021-06-13 DIAGNOSIS — Z9481 Bone marrow transplant status: Secondary | ICD-10-CM

## 2021-06-13 DIAGNOSIS — Z5112 Encounter for antineoplastic immunotherapy: Secondary | ICD-10-CM | POA: Diagnosis not present

## 2021-06-13 DIAGNOSIS — T451X5A Adverse effect of antineoplastic and immunosuppressive drugs, initial encounter: Secondary | ICD-10-CM

## 2021-06-13 LAB — CBC WITH DIFFERENTIAL (CANCER CENTER ONLY)
Abs Immature Granulocytes: 0.01 10*3/uL (ref 0.00–0.07)
Basophils Absolute: 0 10*3/uL (ref 0.0–0.1)
Basophils Relative: 0 %
Eosinophils Absolute: 0.1 10*3/uL (ref 0.0–0.5)
Eosinophils Relative: 2 %
HCT: 33.9 % — ABNORMAL LOW (ref 39.0–52.0)
Hemoglobin: 11.3 g/dL — ABNORMAL LOW (ref 13.0–17.0)
Immature Granulocytes: 0 %
Lymphocytes Relative: 19 %
Lymphs Abs: 0.8 10*3/uL (ref 0.7–4.0)
MCH: 33.9 pg (ref 26.0–34.0)
MCHC: 33.3 g/dL (ref 30.0–36.0)
MCV: 101.8 fL — ABNORMAL HIGH (ref 80.0–100.0)
Monocytes Absolute: 0.7 10*3/uL (ref 0.1–1.0)
Monocytes Relative: 16 %
Neutro Abs: 2.6 10*3/uL (ref 1.7–7.7)
Neutrophils Relative %: 63 %
Platelet Count: 110 10*3/uL — ABNORMAL LOW (ref 150–400)
RBC: 3.33 MIL/uL — ABNORMAL LOW (ref 4.22–5.81)
RDW: 15 % (ref 11.5–15.5)
WBC Count: 4.2 10*3/uL (ref 4.0–10.5)
nRBC: 0 % (ref 0.0–0.2)

## 2021-06-13 LAB — CMP (CANCER CENTER ONLY)
ALT: 14 U/L (ref 0–44)
AST: 18 U/L (ref 15–41)
Albumin: 4.2 g/dL (ref 3.5–5.0)
Alkaline Phosphatase: 46 U/L (ref 38–126)
Anion gap: 7 (ref 5–15)
BUN: 28 mg/dL — ABNORMAL HIGH (ref 8–23)
CO2: 24 mmol/L (ref 22–32)
Calcium: 9.9 mg/dL (ref 8.9–10.3)
Chloride: 106 mmol/L (ref 98–111)
Creatinine: 1.14 mg/dL (ref 0.61–1.24)
GFR, Estimated: >60 mL/min (ref >60)
Glucose, Bld: 71 mg/dL (ref 70–99)
Potassium: 4.6 mmol/L (ref 3.5–5.1)
Sodium: 137 mmol/L (ref 135–145)
Total Bilirubin: 0.4 mg/dL (ref 0.3–1.2)
Total Protein: 6.6 g/dL (ref 6.5–8.1)

## 2021-06-13 MED ORDER — SODIUM CHLORIDE 0.9 % IV SOLN: INTRAVENOUS | Status: DC

## 2021-06-13 MED ORDER — ZOLEDRONIC ACID 4 MG/100ML IV SOLN
4.0000 mg | Freq: Once | INTRAVENOUS | Status: AC
  Administered 2021-06-13: 4 mg via INTRAVENOUS
  Filled 2021-06-13: qty 100

## 2021-06-13 MED ORDER — BORTEZOMIB CHEMO SQ INJECTION 3.5 MG (2.5MG/ML)
1.3000 mg/m2 | Freq: Once | INTRAMUSCULAR | Status: AC
  Administered 2021-06-13: 2.5 mg via SUBCUTANEOUS
  Filled 2021-06-13: qty 1

## 2021-06-13 MED ORDER — DEXAMETHASONE 4 MG PO TABS
10.0000 mg | ORAL_TABLET | Freq: Once | ORAL | Status: AC
  Administered 2021-06-13: 10 mg via ORAL

## 2021-06-13 NOTE — Progress Notes (Signed)
Patient presents for treatment. RN assessment completed along with the following:  Labs/vitals reviewed - Yes, and within treatment parameters.   Weight within 10% of previous measurement - Yes Oncology Treatment Attestation completed for current therapy- Yes, on date 04/25/2021 Informed consent completed and reflects current therapy/intent - Yes, on date Velcade 11/25/2018; 03/23/2021 Darzalex Faspro             Provider progress note reviewed - Patient not seen by provider today. Most recent note dated 05/24/2021 reviewed. Treatment/Antibody/Supportive plan reviewed - Yes, and there are no adjustments needed for today's treatment. S&H and other orders reviewed - Yes, and there are no additional orders identified. Previous treatment date reviewed - Yes, and the appropriate amount of time has elapsed between treatments. Clinic Hand Off Received from - No, patient not seen by MD.  Patient to proceed with treatment.

## 2021-06-13 NOTE — Patient Instructions (Signed)
[facility]   Discharge Instructions: Thank you for choosing [facility] to provide your oncology and hematology care.   If you have a lab appointment with the [facility], please go directly to the [facility] and check in at the registration area.   Wear comfortable clothing and clothing appropriate for easy access to any Portacath or PICC line.   We strive to give you quality time with your provider. You may need to reschedule your appointment if you arrive late (15 or more minutes).  Arriving late affects you and other patients whose appointments are after yours.  Also, if you miss three or more appointments without notifying the office, you may be dismissed from the clinic at the provider's discretion.      For prescription refill requests, have your pharmacy contact our office and allow 72 hours for refills to be completed.    Today you received the following chemotherapy and/or immunotherapy agents Bortezomib (VELCADE).      To help prevent nausea and vomiting after your treatment, we encourage you to take your nausea medication as directed.  BELOW ARE SYMPTOMS THAT SHOULD BE REPORTED IMMEDIATELY: *FEVER GREATER THAN 100.4 F (38 C) OR HIGHER *CHILLS OR SWEATING *NAUSEA AND VOMITING THAT IS NOT CONTROLLED WITH YOUR NAUSEA MEDICATION *UNUSUAL SHORTNESS OF BREATH *UNUSUAL BRUISING OR BLEEDING *URINARY PROBLEMS (pain or burning when urinating, or frequent urination) *BOWEL PROBLEMS (unusual diarrhea, constipation, pain near the anus) TENDERNESS IN MOUTH AND THROAT WITH OR WITHOUT PRESENCE OF ULCERS (sore throat, sores in mouth, or a toothache) UNUSUAL RASH, SWELLING OR PAIN  UNUSUAL VAGINAL DISCHARGE OR ITCHING   Items with * indicate a potential emergency and should be followed up as soon as possible or go to the Emergency Department if any problems should occur.  Please show the CHEMOTHERAPY ALERT CARD or IMMUNOTHERAPY ALERT CARD at  check-in to the Emergency Department and triage nurse.  Should you have questions after your visit or need to cancel or reschedule your appointment, please contact [facility]  Dept: [phone]  and follow the prompts.  Office hours are 8:00 a.m. to 4:30 p.m. Monday - Friday. Please note that voicemails left after 4:00 p.m. may not be returned until the following business day.  We are closed weekends and major holidays. You have access to a nurse at all times for urgent questions. Please call the main number to the clinic Dept: [phone] and follow the prompts.   For any non-urgent questions, you may also contact your provider using MyChart. We now offer e-Visits for anyone 32 and older to request care online for non-urgent symptoms. For details visit [email or web address].   Also download the MyChart app! Go to the app store, search "MyChart", open the app, select [facility], and log in with your MyChart username and password.  Due to Covid, a mask is required upon entering the hospital/clinic. If you do not have a mask, one will be given to you upon arrival. For doctor visits, patients may have 1 support person aged 41 or older with them. For treatment visits, patients cannot have anyone with them due to current Covid guidelines and our immunocompromised population.   Zoledronic Acid Injection (Hypercalcemia, Oncology) What is this medication? ZOLEDRONIC ACID (ZOE le dron ik AS id) slows calcium loss from bones. It high calcium levels in the blood from some kinds of cancer. It may be used in other people at risk for bone loss. This  medicine may be used for other purposes; ask your health care provider or pharmacist if you have questions. COMMON BRAND NAME(S): Zometa What should I tell my care team before I take this medication? They need to know if you have any of these conditions: cancer dehydration dental disease kidney disease liver disease low levels of  calcium in the blood lung or breathing disease (asthma) receiving steroids like dexamethasone or prednisone an unusual or allergic reaction to zoledronic acid, other medicines, foods, dyes, or preservatives pregnant or trying to get pregnant breast-feeding How should I use this medication? This drug is injected into a vein. It is given by a health care provider in a hospital or clinic setting. Talk to your health care provider about the use of this drug in children. Special care may be needed. Overdosage: If you think you have taken too much of this medicine contact a poison control center or emergency room at once. NOTE: This medicine is only for you. Do not share this medicine with others. What if I miss a dose? Keep appointments for follow-up doses. It is important not to miss your dose. Call your health care provider if you are unable to keep an appointment. What may interact with this medication? certain antibiotics given by injection NSAIDs, medicines for pain and inflammation, like ibuprofen or naproxen some diuretics like bumetanide, furosemide teriparatide thalidomide This list may not describe all possible interactions. Give your health care provider a list of all the medicines, herbs, non-prescription drugs, or dietary supplements you use. Also tell them if you smoke, drink alcohol, or use illegal drugs. Some items may interact with your medicine. What should I watch for while using this medication? Visit your health care provider for regular checks on your progress. It may be some time before you see the benefit from this drug. Some people who take this drug have severe bone, joint, or muscle pain. This drug may also increase your risk for jaw problems or a broken thigh bone. Tell your health care provider right away if you have severe pain in your jaw, bones, joints, or muscles. Tell you health care provider if you have any pain that does not go away or that gets worse. Tell your  dentist and dental surgeon that you are taking this drug. You should not have major dental surgery while on this drug. See your dentist to have a dental exam and fix any dental problems before starting this drug. Take good care of your teeth while on this drug. Make sure you see your dentist for regular follow-up appointments. You should make sure you get enough calcium and vitamin D while you are taking this drug. Discuss the foods you eat and the vitamins you take with your health care provider. Check with your health care provider if you have severe diarrhea, nausea, and vomiting, or if you sweat a lot. The loss of too much body fluid may make it dangerous for you to take this drug. You may need blood work done while you are taking this drug. Do not become pregnant while taking this drug. Women should inform their health care provider if they wish to become pregnant or think they might be pregnant. There is potential for serious harm to an unborn child. Talk to your health care provider for more information. What side effects may I notice from receiving this medication? Side effects that you should report to your doctor or health care provider as soon as possible: allergic reactions (skin rash, itching  or hives; swelling of the face, lips, or tongue) bone pain infection (fever, chills, cough, sore throat, pain or trouble passing urine) jaw pain, especially after dental work joint pain kidney injury (trouble passing urine or change in the amount of urine) low blood pressure (dizziness; feeling faint or lightheaded, falls; unusually weak or tired) low calcium levels (fast heartbeat; muscle cramps or pain; pain, tingling, or numbness in the hands or feet; seizures) low magnesium levels (fast, irregular heartbeat; muscle cramp or pain; muscle weakness; tremors; seizures) low red blood cell counts (trouble breathing; feeling faint; lightheaded, falls; unusually weak or tired) muscle pain redness,  blistering, peeling, or loosening of the skin, including inside the mouth severe diarrhea swelling of the ankles, feet, hands trouble breathing Side effects that usually do not require medical attention (report to your doctor or health care provider if they continue or are bothersome): anxious constipation coughing depressed mood eye irritation, itching, or pain fever general ill feeling or flu-like symptoms nausea pain, redness, or irritation at site where injected trouble sleeping This list may not describe all possible side effects. Call your doctor for medical advice about side effects. You may report side effects to FDA at 1-800-FDA-1088. Where should I keep my medication? This drug is given in a hospital or clinic. It will not be stored at home. NOTE: This sheet is a summary. It may not cover all possible information. If you have questions about this medicine, talk to your doctor, pharmacist, or health care provider.  2022 Elsevier/Gold Standard (2019-06-19 09:13:00)

## 2021-06-20 ENCOUNTER — Ambulatory Visit: Payer: Medicare Other | Admitting: Oncology

## 2021-06-20 ENCOUNTER — Ambulatory Visit: Payer: Medicare Other

## 2021-06-20 ENCOUNTER — Other Ambulatory Visit: Payer: Medicare Other

## 2021-06-27 ENCOUNTER — Other Ambulatory Visit: Payer: Medicare Other

## 2021-06-27 ENCOUNTER — Other Ambulatory Visit: Payer: Self-pay

## 2021-06-27 ENCOUNTER — Ambulatory Visit: Payer: Medicare Other

## 2021-06-27 ENCOUNTER — Inpatient Hospital Stay: Payer: Medicare Other

## 2021-06-27 ENCOUNTER — Inpatient Hospital Stay (HOSPITAL_BASED_OUTPATIENT_CLINIC_OR_DEPARTMENT_OTHER): Payer: Medicare Other | Admitting: Oncology

## 2021-06-27 ENCOUNTER — Encounter: Payer: Self-pay | Admitting: Oncology

## 2021-06-27 ENCOUNTER — Other Ambulatory Visit (HOSPITAL_BASED_OUTPATIENT_CLINIC_OR_DEPARTMENT_OTHER): Payer: Self-pay

## 2021-06-27 ENCOUNTER — Ambulatory Visit: Payer: Medicare Other | Attending: Internal Medicine

## 2021-06-27 ENCOUNTER — Inpatient Hospital Stay: Payer: Medicare Other | Attending: Oncology

## 2021-06-27 VITALS — BP 133/76 | HR 68 | Temp 98.7°F | Resp 18 | Ht 69.0 in | Wt 181.8 lb

## 2021-06-27 DIAGNOSIS — C9000 Multiple myeloma not having achieved remission: Secondary | ICD-10-CM

## 2021-06-27 DIAGNOSIS — Z5112 Encounter for antineoplastic immunotherapy: Secondary | ICD-10-CM | POA: Diagnosis present

## 2021-06-27 DIAGNOSIS — Z79899 Other long term (current) drug therapy: Secondary | ICD-10-CM | POA: Insufficient documentation

## 2021-06-27 DIAGNOSIS — G620 Drug-induced polyneuropathy: Secondary | ICD-10-CM

## 2021-06-27 DIAGNOSIS — Z23 Encounter for immunization: Secondary | ICD-10-CM

## 2021-06-27 DIAGNOSIS — Z9481 Bone marrow transplant status: Secondary | ICD-10-CM

## 2021-06-27 LAB — CBC WITH DIFFERENTIAL (CANCER CENTER ONLY)
Abs Immature Granulocytes: 0.01 10*3/uL (ref 0.00–0.07)
Basophils Absolute: 0 10*3/uL (ref 0.0–0.1)
Basophils Relative: 0 %
Eosinophils Absolute: 0.1 10*3/uL (ref 0.0–0.5)
Eosinophils Relative: 1 %
HCT: 33.5 % — ABNORMAL LOW (ref 39.0–52.0)
Hemoglobin: 11.2 g/dL — ABNORMAL LOW (ref 13.0–17.0)
Immature Granulocytes: 0 %
Lymphocytes Relative: 18 %
Lymphs Abs: 1.1 10*3/uL (ref 0.7–4.0)
MCH: 33.6 pg (ref 26.0–34.0)
MCHC: 33.4 g/dL (ref 30.0–36.0)
MCV: 100.6 fL — ABNORMAL HIGH (ref 80.0–100.0)
Monocytes Absolute: 0.8 10*3/uL (ref 0.1–1.0)
Monocytes Relative: 13 %
Neutro Abs: 4.2 10*3/uL (ref 1.7–7.7)
Neutrophils Relative %: 68 %
Platelet Count: 123 10*3/uL — ABNORMAL LOW (ref 150–400)
RBC: 3.33 MIL/uL — ABNORMAL LOW (ref 4.22–5.81)
RDW: 14.6 % (ref 11.5–15.5)
WBC Count: 6.2 10*3/uL (ref 4.0–10.5)
nRBC: 0 % (ref 0.0–0.2)

## 2021-06-27 LAB — CMP (CANCER CENTER ONLY)
ALT: 11 U/L (ref 0–44)
AST: 18 U/L (ref 15–41)
Albumin: 4.1 g/dL (ref 3.5–5.0)
Alkaline Phosphatase: 46 U/L (ref 38–126)
Anion gap: 8 (ref 5–15)
BUN: 25 mg/dL — ABNORMAL HIGH (ref 8–23)
CO2: 25 mmol/L (ref 22–32)
Calcium: 10.1 mg/dL (ref 8.9–10.3)
Chloride: 101 mmol/L (ref 98–111)
Creatinine: 1.04 mg/dL (ref 0.61–1.24)
GFR, Estimated: >60 mL/min (ref >60)
Glucose, Bld: 83 mg/dL (ref 70–99)
Potassium: 4.4 mmol/L (ref 3.5–5.1)
Sodium: 134 mmol/L — ABNORMAL LOW (ref 135–145)
Total Bilirubin: 0.5 mg/dL (ref 0.3–1.2)
Total Protein: 6.8 g/dL (ref 6.5–8.1)

## 2021-06-27 MED ORDER — PFIZER COVID-19 VAC BIVALENT 30 MCG/0.3ML IM SUSP
INTRAMUSCULAR | 0 refills | Status: DC
  Filled 2021-06-27: qty 0.3, 1d supply, fill #0

## 2021-06-27 MED ORDER — DARATUMUMAB-HYALURONIDASE-FIHJ 1800-30000 MG-UT/15ML ~~LOC~~ SOLN
1800.0000 mg | Freq: Once | SUBCUTANEOUS | Status: AC
  Administered 2021-06-27: 1800 mg via SUBCUTANEOUS
  Filled 2021-06-27: qty 15

## 2021-06-27 MED ORDER — BORTEZOMIB CHEMO SQ INJECTION 3.5 MG (2.5MG/ML)
1.3000 mg/m2 | Freq: Once | INTRAMUSCULAR | Status: AC
  Administered 2021-06-27: 2.5 mg via SUBCUTANEOUS
  Filled 2021-06-27: qty 1

## 2021-06-27 MED ORDER — DIPHENHYDRAMINE HCL 25 MG PO CAPS
25.0000 mg | ORAL_CAPSULE | Freq: Once | ORAL | Status: AC
  Administered 2021-06-27: 25 mg via ORAL
  Filled 2021-06-27: qty 1

## 2021-06-27 MED ORDER — ACETAMINOPHEN 325 MG PO TABS
650.0000 mg | ORAL_TABLET | Freq: Once | ORAL | Status: AC
  Administered 2021-06-27: 650 mg via ORAL
  Filled 2021-06-27: qty 2

## 2021-06-27 MED ORDER — DEXAMETHASONE 4 MG PO TABS
10.0000 mg | ORAL_TABLET | Freq: Once | ORAL | Status: AC
  Administered 2021-06-27: 10 mg via ORAL
  Filled 2021-06-27: qty 3

## 2021-06-27 MED ORDER — INFLUENZA VAC A&B SA ADJ QUAD 0.5 ML IM PRSY
PREFILLED_SYRINGE | INTRAMUSCULAR | 0 refills | Status: DC
  Filled 2021-06-27: qty 0.5, 1d supply, fill #0

## 2021-06-27 NOTE — Progress Notes (Signed)
Andre [name] OFFICE PROGRESS NOTE   Diagnosis: Multiple myeloma  INTERVAL HISTORY:   Andre [name] returns as scheduled.  He completed another treatment with Velcade and daratumumab on 06/13/2021.  No nausea, diarrhea, rash, or respiratory symptoms.  He feels well.  He is exercising.  Objective:  Vital signs in last 24 hours:  Blood pressure 133/76, pulse 68, temperature 98.7 F (37.1 C), temperature source Oral, resp. rate 18, height 5' 9"  (1.753 m), weight 181 lb 12.8 oz (82.5 kg), SpO2 100 %.    HEENT: No thrush or ulcers Resp: Lungs clear bilaterally Cardio: Regular rate and rhythm GI: No hepatosplenomegaly Vascular: Trace pitting edema at the lower leg and ankle bilaterally  Skin: Hyperpigmented areas and injection sites over the lower abdominal wall  Lab Results:  Lab Results  Component Value Date   WBC 6.2 06/27/2021   HGB 11.2 (L) 06/27/2021   HCT 33.5 (L) 06/27/2021   MCV 100.6 (H) 06/27/2021   PLT 123 (L) 06/27/2021   NEUTROABS 4.2 06/27/2021    CMP  Lab Results  Component Value Date   NA 137 06/13/2021   K 4.6 06/13/2021   CL 106 06/13/2021   CO2 24 06/13/2021   GLUCOSE 71 06/13/2021   BUN 28 (H) 06/13/2021   CREATININE 1.14 06/13/2021   CALCIUM 9.9 06/13/2021   PROT 6.6 06/13/2021   ALBUMIN 4.2 06/13/2021   AST 18 06/13/2021   ALT 14 06/13/2021   ALKPHOS 46 06/13/2021   BILITOT 0.4 06/13/2021   GFRNONAA >60 06/13/2021   GFRAA >60 06/15/2020     Medications: I have reviewed the patient's current medications.   Assessment/Plan: Multiple myeloma- IgG lambda  bortezomib (subcutaneously), lenalidomide, and dexamethasone, with repeat bone marrow biopsy May of 2012 showing 10% plasmacytosis   (2) high-dose chemotherapy with BCNU and melphalan at [facility], followed by stem cell rescue July of 2012   (3) on zoledronic acid started December of 2012, initially monthly, currently given every 3 months, most recent dose  12/07/2015   (4)  low-dose lenalidomide resumed April 2013, interrupted several times.  Resumed again on 02/19/2013, ata dose of 5 mg daily, 21 days on and 7 days off, later further reduced to 7 days on, 7 days off   (5) CNS symptoms and abnormal brain MRI extensively evaluated by neurology with no definitive diagnosis established   (6) rising M spike noted June 2014 but did not meet criteria for carfilzomib study   (7) on lenalidomide 25 mg daily, 14 days on, 7 days off, starting 04/18/2013, interrupted December 2014 because of rash;             (a) resumed January 2015 at 10 mg/ day at 21 days on/ 7 days off             ([place]) starting 08/18/2014 decreased to 10 mg/ day 14 days on, 7 days off because of cytopenias             (c) as of February 2016 was on 5 mg daily 7 days on 7 days off             (d) lenalidomide discontinued December 2016 with evidence of disease progression   (8) transient global amnesia 05/29/2015, resolved without intervention   (9) starting PVD 10/25/2015 w ASA 325 thromboprophylaxis, valacyclovir prophylaxis, last dose 12/17/2015             (a) pomalidomide 4 mg/d days 1-14             (  [place]) bortezomib sQ days 2,5,9,12 of each 21 day cycle             (c) dexamethasome 20 mg two days a week             (d) dexamethasone bortezomib and pomalidomide discontinued late December 2018 with poor tolerance   (10) metapneumovirus pneumonia April 2017             (a) completing course of steroids and week of bactrim mid April 2017   (11) status post second autologous transplant at [facility] 02/04/2016(preparatory regimen melphalan 200 mg/m)             (a) received twelve-month vaccinations 03/14/2017 (DPT, Haemophilus, Pneumovax 13, polio)             ([place]) 14 months injections 05/04/2016 include DTaP, Hib conjugate, HepB energex Hamish 20 mcg/ml, Prevnar 13             (c) 24 month vaccines due at [facility] June 2019   (12) maintenance therapy started November 2017, consisting of             (a) bortezomib  1.3 mg/M2 every 14 days, first dose 07/27/2016             ([place]) pomalidomide 1 mg days 1-21 Q28 days, started 07/19/2016             (c) zolendronate monthly started 07/27/2016 (previously Q 3 months) however patient unable to tolerate, and changed back to q3 months in April, 2018             (d) Bortezomib changed to monthly as of June 2018 because of tolerance issues, however discontinued after 09/14/2017 dose because of a rise in his M spike             (e) pomalidomide held after 10/18/2017 in preparation for possible study at [place] (venetoclax)--never resumed             (f) with numbers actually improved off treatment, resumed every 2-week bortezomib 12/25/2017             (g) changed to every 3-week bortezomib as of 03/17/2019             (h) briefly on weekly treatments times 22 October 2020 due to increase in M spike             (I) maintenance therapy discontinued with evidence of progression   (13) bortezomib/daratumumab/dexamethasone started 12/20/2020.             (a) Decadron dose dropped to 10 mg day of and day following treatment starting May 2022             ([place]) day 8 bortezomib omitted beginning with the June cycle per patient preference             (C) treatment changed to monthly daratumumab/Velcade/Decadron beginning 06/27/2021-patient decision      Disposition: Andre [name] appears stable.  He is tolerating the treatment well.  He will continue Velcade/daratumumab/Decadron.  The serum M spike was stable on 05/09/2021.  I recommended continuing every 2-week Velcade/Decadron, but he would like to be treated on a monthly schedule.  I recommended he obtain an influenza vaccine and COVID-19 booster vaccine.  Andre [name] will return for an office visit and treatment in 1 month.  Betsy Coder, MD  06/27/2021  8:36 AM

## 2021-06-27 NOTE — Progress Notes (Signed)
   Covid-19 Vaccination Clinic  Name:  Andre [name]    MRN: [id number] DOB: 06-25-1951  06/27/2021  [name] was observed post Covid-19 immunization for 15 minutes without incident. He was provided with Vaccine Information Sheet and instruction to access the V-Safe system.   [name] was instructed to call [phone] with any severe reactions post vaccine: Difficulty breathing  Swelling of face and throat  A fast heartbeat  A bad rash all over body  Dizziness and weakness

## 2021-06-27 NOTE — Progress Notes (Signed)
Patient presents for treatment. RN assessment completed along with the following:  Labs/vitals reviewed - Yes, and within treatment parameters.   Weight within 10% of previous measurement - Yes Oncology Treatment Attestation completed for current therapy- Yes, on date 06/27/21 Informed consent completed and reflects current therapy/intent - Yes, on date 03/23/21 darzalex, 12/20/20 velcade              Provider progress note reviewed - Yes, today's provider note was reviewed. Treatment/Antibody/Supportive plan reviewed - Yes, and there are no adjustments needed for today's treatment. S&H and other orders reviewed - Yes, and there are no additional orders identified. Previous treatment date reviewed - Yes, and the appropriate amount of time has elapsed between treatments.   Patient to proceed with treatment.

## 2021-06-27 NOTE — Patient Instructions (Signed)
[facility]   Discharge Instructions: Thank you for choosing [facility] to provide your oncology and hematology care.   If you have a lab appointment with the [facility], please go directly to the [facility] and check in at the registration area.   Wear comfortable clothing and clothing appropriate for easy access to any Portacath or PICC line.   We strive to give you quality time with your provider. You may need to reschedule your appointment if you arrive late (15 or more minutes).  Arriving late affects you and other patients whose appointments are after yours.  Also, if you miss three or more appointments without notifying the office, you may be dismissed from the clinic at the provider's discretion.      For prescription refill requests, have your pharmacy contact our office and allow 72 hours for refills to be completed.    Today you received the following chemotherapy and/or immunotherapy agents Bortezomib (VELCADE), darzalex faspro.   To help prevent nausea and vomiting after your treatment, we encourage you to take your nausea medication as directed.  BELOW ARE SYMPTOMS THAT SHOULD BE REPORTED IMMEDIATELY: *FEVER GREATER THAN 100.4 F (38 C) OR HIGHER *CHILLS OR SWEATING *NAUSEA AND VOMITING THAT IS NOT CONTROLLED WITH YOUR NAUSEA MEDICATION *UNUSUAL SHORTNESS OF BREATH *UNUSUAL BRUISING OR BLEEDING *URINARY PROBLEMS (pain or burning when urinating, or frequent urination) *BOWEL PROBLEMS (unusual diarrhea, constipation, pain near the anus) TENDERNESS IN MOUTH AND THROAT WITH OR WITHOUT PRESENCE OF ULCERS (sore throat, sores in mouth, or a toothache) UNUSUAL RASH, SWELLING OR PAIN  UNUSUAL VAGINAL DISCHARGE OR ITCHING   Items with * indicate a potential emergency and should be followed up as soon as possible or go to the Emergency Department if any problems should occur.  Please show the CHEMOTHERAPY ALERT CARD or IMMUNOTHERAPY  ALERT CARD at check-in to the Emergency Department and triage nurse.  Should you have questions after your visit or need to cancel or reschedule your appointment, please contact [facility]  Dept: [phone]  and follow the prompts.  Office hours are 8:00 a.m. to 4:30 p.m. Monday - Friday. Please note that voicemails left after 4:00 p.m. may not be returned until the following business day.  We are closed weekends and major holidays. You have access to a nurse at all times for urgent questions. Please call the main number to the clinic Dept: [phone] and follow the prompts.   For any non-urgent questions, you may also contact your provider using MyChart. We now offer e-Visits for anyone 37 and older to request care online for non-urgent symptoms. For details visit [email or web address].   Also download the MyChart app! Go to the app store, search "MyChart", open the app, select [facility], and log in with your MyChart username and password.  Due to Covid, a mask is required upon entering the hospital/clinic. If you do not have a mask, one will be given to you upon arrival. For doctor visits, patients may have 1 support person aged 86 or older with them. For treatment visits, patients cannot have anyone with them due to current Covid guidelines and our immunocompromised population.

## 2021-06-28 LAB — KAPPA/LAMBDA LIGHT CHAINS
Kappa free light chain: 9.2 mg/L (ref 3.3–19.4)
Kappa, lambda light chain ratio: 0.27 (ref 0.26–1.65)
Lambda free light chains: 34 mg/L — ABNORMAL HIGH (ref 5.7–26.3)

## 2021-06-28 LAB — IGG: IgG (Immunoglobin G), Serum: 847 mg/dL (ref 603–1613)

## 2021-06-29 LAB — PROTEIN ELECTROPHORESIS, SERUM
A/G Ratio: 1.3 (ref 0.7–1.7)
Albumin ELP: 3.5 g/dL (ref 2.9–4.4)
Alpha-1-Globulin: 0.2 g/dL (ref 0.0–0.4)
Alpha-2-Globulin: 0.8 g/dL (ref 0.4–1.0)
Beta Globulin: 1 g/dL (ref 0.7–1.3)
Gamma Globulin: 0.7 g/dL (ref 0.4–1.8)
Globulin, Total: 2.7 g/dL (ref 2.2–3.9)
M-Spike, %: 0.4 g/dL — ABNORMAL HIGH
Total Protein ELP: 6.2 g/dL (ref 6.0–8.5)

## 2021-06-30 ENCOUNTER — Encounter: Payer: Self-pay | Admitting: Nurse Practitioner

## 2021-07-01 ENCOUNTER — Other Ambulatory Visit: Payer: Self-pay

## 2021-07-01 ENCOUNTER — Telehealth: Payer: Self-pay

## 2021-07-01 DIAGNOSIS — C9000 Multiple myeloma not having achieved remission: Secondary | ICD-10-CM

## 2021-07-01 NOTE — Telephone Encounter (Signed)
Pt sent a my chart message stating [facility] gave him a myeloma test kit which required several labs they wanted [place] to obtain orders placed and appt made for wed 07/06/21

## 2021-07-06 ENCOUNTER — Other Ambulatory Visit: Payer: Self-pay

## 2021-07-06 ENCOUNTER — Encounter: Payer: Self-pay | Admitting: Oncology

## 2021-07-06 ENCOUNTER — Inpatient Hospital Stay: Payer: Medicare Other

## 2021-07-06 ENCOUNTER — Other Ambulatory Visit (HOSPITAL_BASED_OUTPATIENT_CLINIC_OR_DEPARTMENT_OTHER): Payer: Self-pay

## 2021-07-06 DIAGNOSIS — C9000 Multiple myeloma not having achieved remission: Secondary | ICD-10-CM

## 2021-07-06 DIAGNOSIS — Z5112 Encounter for antineoplastic immunotherapy: Secondary | ICD-10-CM | POA: Diagnosis not present

## 2021-07-06 LAB — CBC WITH DIFFERENTIAL (CANCER CENTER ONLY)
Abs Immature Granulocytes: 0.02 10*3/uL (ref 0.00–0.07)
Basophils Absolute: 0 10*3/uL (ref 0.0–0.1)
Basophils Relative: 0 %
Eosinophils Absolute: 0.1 10*3/uL (ref 0.0–0.5)
Eosinophils Relative: 2 %
HCT: 33.4 % — ABNORMAL LOW (ref 39.0–52.0)
Hemoglobin: 11.2 g/dL — ABNORMAL LOW (ref 13.0–17.0)
Immature Granulocytes: 0 %
Lymphocytes Relative: 16 %
Lymphs Abs: 1 10*3/uL (ref 0.7–4.0)
MCH: 34 pg (ref 26.0–34.0)
MCHC: 33.5 g/dL (ref 30.0–36.0)
MCV: 101.5 fL — ABNORMAL HIGH (ref 80.0–100.0)
Monocytes Absolute: 0.7 10*3/uL (ref 0.1–1.0)
Monocytes Relative: 11 %
Neutro Abs: 4.4 10*3/uL (ref 1.7–7.7)
Neutrophils Relative %: 71 %
Platelet Count: 128 10*3/uL — ABNORMAL LOW (ref 150–400)
RBC: 3.29 MIL/uL — ABNORMAL LOW (ref 4.22–5.81)
RDW: 14.8 % (ref 11.5–15.5)
WBC Count: 6.3 10*3/uL (ref 4.0–10.5)
nRBC: 0 % (ref 0.0–0.2)

## 2021-07-06 LAB — CMP (CANCER CENTER ONLY)
ALT: 11 U/L (ref 0–44)
AST: 17 U/L (ref 15–41)
Albumin: 4 g/dL (ref 3.5–5.0)
Alkaline Phosphatase: 46 U/L (ref 38–126)
Anion gap: 8 (ref 5–15)
BUN: 30 mg/dL — ABNORMAL HIGH (ref 8–23)
CO2: 22 mmol/L (ref 22–32)
Calcium: 9.5 mg/dL (ref 8.9–10.3)
Chloride: 104 mmol/L (ref 98–111)
Creatinine: 1.03 mg/dL (ref 0.61–1.24)
GFR, Estimated: >60 mL/min (ref >60)
Glucose, Bld: 103 mg/dL — ABNORMAL HIGH (ref 70–99)
Potassium: 4.4 mmol/L (ref 3.5–5.1)
Sodium: 134 mmol/L — ABNORMAL LOW (ref 135–145)
Total Bilirubin: 0.3 mg/dL (ref 0.3–1.2)
Total Protein: 6.3 g/dL — ABNORMAL LOW (ref 6.5–8.1)

## 2021-07-07 LAB — KAPPA/LAMBDA LIGHT CHAINS
Kappa free light chain: 9.9 mg/L (ref 3.3–19.4)
Kappa, lambda light chain ratio: 0.23 — ABNORMAL LOW (ref 0.26–1.65)
Lambda free light chains: 42.2 mg/L — ABNORMAL HIGH (ref 5.7–26.3)

## 2021-07-07 LAB — IGG: IgG (Immunoglobin G), Serum: 828 mg/dL (ref 603–1613)

## 2021-07-07 LAB — BETA 2 MICROGLOBULIN, SERUM: Beta-2 Microglobulin: 2.4 mg/L (ref 0.6–2.4)

## 2021-07-07 LAB — IGA: IgA: 13 mg/dL — ABNORMAL LOW (ref 61–437)

## 2021-07-07 LAB — IGM: IgM (Immunoglobulin M), Srm: 53 mg/dL (ref 15–143)

## 2021-07-23 ENCOUNTER — Other Ambulatory Visit: Payer: Self-pay | Admitting: Nurse Practitioner

## 2021-07-24 ENCOUNTER — Other Ambulatory Visit: Payer: Self-pay | Admitting: Oncology

## 2021-07-25 ENCOUNTER — Other Ambulatory Visit: Payer: Medicare Other

## 2021-07-25 ENCOUNTER — Encounter: Payer: Self-pay | Admitting: Oncology

## 2021-07-25 ENCOUNTER — Inpatient Hospital Stay (HOSPITAL_BASED_OUTPATIENT_CLINIC_OR_DEPARTMENT_OTHER): Payer: Medicare Other | Admitting: Oncology

## 2021-07-25 ENCOUNTER — Inpatient Hospital Stay: Payer: Medicare Other | Attending: Oncology

## 2021-07-25 ENCOUNTER — Other Ambulatory Visit: Payer: Self-pay

## 2021-07-25 ENCOUNTER — Inpatient Hospital Stay: Payer: Medicare Other

## 2021-07-25 ENCOUNTER — Telehealth: Payer: Self-pay

## 2021-07-25 VITALS — BP 114/64 | HR 74 | Temp 98.1°F | Resp 20 | Ht 69.0 in | Wt 182.0 lb

## 2021-07-25 DIAGNOSIS — Z23 Encounter for immunization: Secondary | ICD-10-CM | POA: Diagnosis not present

## 2021-07-25 DIAGNOSIS — C9000 Multiple myeloma not having achieved remission: Secondary | ICD-10-CM | POA: Diagnosis present

## 2021-07-25 DIAGNOSIS — Z79899 Other long term (current) drug therapy: Secondary | ICD-10-CM | POA: Diagnosis not present

## 2021-07-25 DIAGNOSIS — T451X5A Adverse effect of antineoplastic and immunosuppressive drugs, initial encounter: Secondary | ICD-10-CM

## 2021-07-25 DIAGNOSIS — Z9481 Bone marrow transplant status: Secondary | ICD-10-CM

## 2021-07-25 DIAGNOSIS — G620 Drug-induced polyneuropathy: Secondary | ICD-10-CM

## 2021-07-25 DIAGNOSIS — Z5112 Encounter for antineoplastic immunotherapy: Secondary | ICD-10-CM | POA: Insufficient documentation

## 2021-07-25 LAB — CMP (CANCER CENTER ONLY)
ALT: 12 U/L (ref 0–44)
AST: 19 U/L (ref 15–41)
Albumin: 4.2 g/dL (ref 3.5–5.0)
Alkaline Phosphatase: 49 U/L (ref 38–126)
Anion gap: 6 (ref 5–15)
BUN: 35 mg/dL — ABNORMAL HIGH (ref 8–23)
CO2: 26 mmol/L (ref 22–32)
Calcium: 9.6 mg/dL (ref 8.9–10.3)
Chloride: 103 mmol/L (ref 98–111)
Creatinine: 1.26 mg/dL — ABNORMAL HIGH (ref 0.61–1.24)
GFR, Estimated: >60 mL/min (ref >60)
Glucose, Bld: 83 mg/dL (ref 70–99)
Potassium: 5.3 mmol/L — ABNORMAL HIGH (ref 3.5–5.1)
Sodium: 135 mmol/L (ref 135–145)
Total Bilirubin: 0.4 mg/dL (ref 0.3–1.2)
Total Protein: 6.7 g/dL (ref 6.5–8.1)

## 2021-07-25 LAB — CBC WITH DIFFERENTIAL (CANCER CENTER ONLY)
Abs Immature Granulocytes: 0.01 10*3/uL (ref 0.00–0.07)
Basophils Absolute: 0 10*3/uL (ref 0.0–0.1)
Basophils Relative: 1 %
Eosinophils Absolute: 0.1 10*3/uL (ref 0.0–0.5)
Eosinophils Relative: 3 %
HCT: 34.2 % — ABNORMAL LOW (ref 39.0–52.0)
Hemoglobin: 11.3 g/dL — ABNORMAL LOW (ref 13.0–17.0)
Immature Granulocytes: 0 %
Lymphocytes Relative: 20 %
Lymphs Abs: 1.1 10*3/uL (ref 0.7–4.0)
MCH: 33.2 pg (ref 26.0–34.0)
MCHC: 33 g/dL (ref 30.0–36.0)
MCV: 100.6 fL — ABNORMAL HIGH (ref 80.0–100.0)
Monocytes Absolute: 0.8 10*3/uL (ref 0.1–1.0)
Monocytes Relative: 15 %
Neutro Abs: 3.4 10*3/uL (ref 1.7–7.7)
Neutrophils Relative %: 61 %
Platelet Count: 119 10*3/uL — ABNORMAL LOW (ref 150–400)
RBC: 3.4 MIL/uL — ABNORMAL LOW (ref 4.22–5.81)
RDW: 14.8 % (ref 11.5–15.5)
WBC Count: 5.5 10*3/uL (ref 4.0–10.5)
nRBC: 0 % (ref 0.0–0.2)

## 2021-07-25 MED ORDER — DARATUMUMAB-HYALURONIDASE-FIHJ 1800-30000 MG-UT/15ML ~~LOC~~ SOLN
1800.0000 mg | Freq: Once | SUBCUTANEOUS | Status: AC
  Administered 2021-07-25: 1800 mg via SUBCUTANEOUS
  Filled 2021-07-25: qty 15

## 2021-07-25 MED ORDER — BORTEZOMIB CHEMO SQ INJECTION 3.5 MG (2.5MG/ML)
1.3000 mg/m2 | Freq: Once | INTRAMUSCULAR | Status: AC
  Administered 2021-07-25: 2.5 mg via SUBCUTANEOUS
  Filled 2021-07-25: qty 1

## 2021-07-25 MED ORDER — INFLUENZA VAC A&B SA ADJ QUAD 0.5 ML IM PRSY
0.5000 mL | PREFILLED_SYRINGE | Freq: Once | INTRAMUSCULAR | Status: AC
  Administered 2021-07-25: 0.5 mL via INTRAMUSCULAR
  Filled 2021-07-25: qty 0.5

## 2021-07-25 MED ORDER — DIPHENHYDRAMINE HCL 25 MG PO CAPS
25.0000 mg | ORAL_CAPSULE | Freq: Once | ORAL | Status: AC
  Administered 2021-07-25: 25 mg via ORAL
  Filled 2021-07-25: qty 1

## 2021-07-25 MED ORDER — ACETAMINOPHEN 325 MG PO TABS
650.0000 mg | ORAL_TABLET | Freq: Once | ORAL | Status: AC
  Administered 2021-07-25: 650 mg via ORAL
  Filled 2021-07-25: qty 2

## 2021-07-25 MED ORDER — DEXAMETHASONE 4 MG PO TABS
10.0000 mg | ORAL_TABLET | Freq: Once | ORAL | Status: AC
  Administered 2021-07-25: 10 mg via ORAL
  Filled 2021-07-25: qty 3

## 2021-07-25 NOTE — Progress Notes (Signed)
[facility] OFFICE PROGRESS NOTE   Diagnosis: Multiple myeloma  INTERVAL HISTORY:   [name] completed another treatment with daratumumab/Velcade on 06/27/2021.  No diffuse rash or dyspnea.  He is exercising.  He has a mild rash in the groin and over the testicles.  Objective:  Vital signs in last 24 hours:  Blood pressure 114/64, pulse 74, temperature 98.1 F (36.7 C), temperature source Oral, resp. rate 20, height _0  (1.753 m), weight 182 lb (82.6 kg), SpO2 100 %.    HEENT: No thrush or ulcers Resp: Lungs clear bilaterally Cardio: Regular rate and rhythm GI: No hepatosplenomegaly Vascular: No leg edema  Skin: Mild erythematous rash in the groin and over the scrotum  Portacath/PICC-without erythema  Lab Results:  Lab Results  Component Value Date   WBC 5.5 07/25/2021   HGB 11.3 (L) 07/25/2021   HCT 34.2 (L) 07/25/2021   MCV 100.6 (H) 07/25/2021   PLT 119 (L) 07/25/2021   NEUTROABS 3.4 07/25/2021    CMP  Lab Results  Component Value Date   NA 134 (L) 07/06/2021   K 4.4 07/06/2021   CL 104 07/06/2021   CO2 22 07/06/2021   GLUCOSE 103 (H) 07/06/2021   BUN 30 (H) 07/06/2021   CREATININE 1.03 07/06/2021   CALCIUM 9.5 07/06/2021   PROT 6.3 (L) 07/06/2021   ALBUMIN 4.0 07/06/2021   AST 17 07/06/2021   ALT 11 07/06/2021   ALKPHOS 46 07/06/2021   BILITOT 0.3 07/06/2021   GFRNONAA >60 07/06/2021   GFRAA >60 06/15/2020     Medications: I have reviewed the patient's current medications.   Assessment/Plan: Multiple myeloma- IgG lambda  bortezomib (subcutaneously), lenalidomide, and dexamethasone, with repeat bone marrow biopsy May of 2012 showing 10% plasmacytosis   (2) high-dose chemotherapy with BCNU and melphalan at [facility], followed by stem cell rescue July of 2012   (3) on zoledronic acid started December of 2012, initially monthly, currently given every 3 months, most recent dose  12/07/2015   (4) low-dose lenalidomide resumed  April 2013, interrupted several times.  Resumed again on 02/19/2013, ata dose of 5 mg daily, 21 days on and 7 days off, later further reduced to 7 days on, 7 days off   (5) CNS symptoms and abnormal brain MRI extensively evaluated by neurology with no definitive diagnosis established   (6) rising M spike noted June 2014 but did not meet criteria for carfilzomib study   (7) on lenalidomide 25 mg daily, 14 days on, 7 days off, starting 04/18/2013, interrupted December 2014 because of rash;             (a) resumed January 2015 at 10 mg/ day at 21 days on/ 7 days off             (Andre [name]) starting 08/18/2014 decreased to 10 mg/ day 14 days on, 7 days off because of cytopenias             (c) as of February 2016 was on 5 mg daily 7 days on 7 days off             (d) lenalidomide discontinued December 2016 with evidence of disease progression   (8) transient global amnesia 05/29/2015, resolved without intervention   (9) starting PVD 10/25/2015 w ASA 325 thromboprophylaxis, valacyclovir prophylaxis, last dose 12/17/2015             (a) pomalidomide 4 mg/d days 1-14             (  Andre [name]) bortezomib sQ days 2,5,9,12 of each 21 day cycle             (c) dexamethasome 20 mg two days a week             (d) dexamethasone bortezomib and pomalidomide discontinued late December 2018 with poor tolerance   (10) metapneumovirus pneumonia April 2017             (a) completing course of steroids and week of bactrim mid April 2017   (11) status post second autologous transplant at [facility] 02/04/2016(preparatory regimen melphalan 200 mg/m)             (a) received twelve-month vaccinations 03/14/2017 (DPT, Haemophilus, Pneumovax 13, polio)             (Andre [name]) 14 months injections 05/04/2016 include DTaP, Hib conjugate, HepB energex Jahziel 20 mcg/ml, Prevnar 13             (c) 24 month vaccines due at [facility] June 2019   (12) maintenance therapy started November 2017, consisting of             (a) bortezomib 1.3 mg/M2 every 14 days, first  dose 07/27/2016             (Andre [name]) pomalidomide 1 mg days 1-21 Q28 days, started 07/19/2016             (c) zolendronate monthly started 07/27/2016 (previously Q 3 months) however patient unable to tolerate, and changed back to q3 months in April, 2018             (d) Bortezomib changed to monthly as of June 2018 because of tolerance issues, however discontinued after 09/14/2017 dose because of a rise in his M spike             (e) pomalidomide held after 10/18/2017 in preparation for possible study at [place] (venetoclax)--never resumed             (f) with numbers actually improved off treatment, resumed every 2-week bortezomib 12/25/2017             (g) changed to every 3-week bortezomib as of 03/17/2019             (h) briefly on weekly treatments times 22 October 2020 due to increase in M spike             (I) maintenance therapy discontinued with evidence of progression   (13) bortezomib/daratumumab/dexamethasone started 12/20/2020.             (a) Decadron dose dropped to 10 mg day of and day following treatment starting May 2022             (Andre [name]) day 8 bortezomib omitted beginning with the June cycle per patient preference             (C) treatment changed to monthly daratumumab/Velcade/Decadron beginning 06/27/2021-patient decision    Disposition: [name] appears stable.  The serum M spike and light chains were slightly higher last month.  We will repeat a myeloma panel when he returns next month.  [name] will complete another treatment with daratumumab and Velcade today.  He will receive an influenza vaccine today.  The and creatinine are mildly elevated today.  We will recommend he increase fluid intake and return for repeat chemistry panel on 07/27/2021.  Betsy Coder, MD  07/25/2021  10:52 AM

## 2021-07-25 NOTE — Progress Notes (Signed)
Patient presents for treatment. RN assessment completed along with the following:  Labs/vitals reviewed - Yes, and within treatment parameters.   Weight within 10% of previous measurement - Yes Oncology Treatment Attestation completed for current therapy- Yes, on date 06/27/21 Informed consent completed and reflects current therapy/intent - Yes, on date 12/20/20             Provider progress note reviewed - Yes, today's provider note was reviewed. Treatment/Antibody/Supportive plan reviewed - Yes, and there are no adjustments needed for today's treatment. S&H and other orders reviewed - Yes, and flu shot Previous treatment date reviewed - Yes, and the appropriate amount of time has elapsed between treatments. Clinic Hand Off Received from - Danae Orleans, LPN   Patient to proceed with treatment.

## 2021-07-25 NOTE — Telephone Encounter (Signed)
Patient seen by Dr. Benay Spice today  Vitals are within treatment parameters.  Labs reviewed by Dr. Benay Spice and are not all within treatment parameters. OK to treat  with creatinine 1.26 and Potassium 5.3 platelet count 119  Per physician team, patient is ready for treatment and there are NO modifications to the treatment plan.

## 2021-07-25 NOTE — Patient Instructions (Signed)
Andre [name]  Discharge Instructions: Thank you for choosing [facility] to provide your oncology and hematology care.   If you have a lab appointment with the [facility], please go directly to the [facility] and check in at the registration area.   Wear comfortable clothing and clothing appropriate for easy access to any Portacath or PICC line.   We strive to give you quality time with your provider. You may need to reschedule your appointment if you arrive late (15 or more minutes).  Arriving late affects you and other patients whose appointments are after yours.  Also, if you miss three or more appointments without notifying the office, you may be dismissed from the clinic at the provider's discretion.      For prescription refill requests, have your pharmacy contact our office and allow 72 hours for refills to be completed.    Today you received the following chemotherapy and/or immunotherapy agents Velcade, Darzalex faspro      To help prevent nausea and vomiting after your treatment, we encourage you to take your nausea medication as directed.  BELOW ARE SYMPTOMS THAT SHOULD BE REPORTED IMMEDIATELY: *FEVER GREATER THAN 100.4 F (38 C) OR HIGHER *CHILLS OR SWEATING *NAUSEA AND VOMITING THAT IS NOT CONTROLLED WITH YOUR NAUSEA MEDICATION *UNUSUAL SHORTNESS OF BREATH *UNUSUAL BRUISING OR BLEEDING *URINARY PROBLEMS (pain or burning when urinating, or frequent urination) *BOWEL PROBLEMS (unusual diarrhea, constipation, pain near the anus) TENDERNESS IN MOUTH AND THROAT WITH OR WITHOUT PRESENCE OF ULCERS (sore throat, sores in mouth, or a toothache) UNUSUAL RASH, SWELLING OR PAIN  UNUSUAL VAGINAL DISCHARGE OR ITCHING   Items with * indicate a potential emergency and should be followed up as soon as possible or go to the Emergency Department if any problems should occur.  Please show the CHEMOTHERAPY ALERT CARD or IMMUNOTHERAPY ALERT CARD at  check-in to the Emergency Department and triage nurse.  Should you have questions after your visit or need to cancel or reschedule your appointment, please contact [facility]  Dept: [phone]  and follow the prompts.  Office hours are 8:00 a.m. to 4:30 p.m. Monday - Friday. Please note that voicemails left after 4:00 p.m. may not be returned until the following business day.  We are closed weekends and major holidays. You have access to a nurse at all times for urgent questions. Please call the main number to the clinic Dept: [phone] and follow the prompts.   For any non-urgent questions, you may also contact your provider using MyChart. We now offer e-Visits for anyone 33 and older to request care online for non-urgent symptoms. For details visit [email or web address].   Also download the MyChart app! Go to the app store, search "MyChart", open the app, select [facility], and log in with your MyChart username and password.  Due to Covid, a mask is required upon entering the hospital/clinic. If you do not have a mask, one will be given to you upon arrival. For doctor visits, patients may have 1 support person aged 28 or older with them. For treatment visits, patients cannot have anyone with them due to current Covid guidelines and our immunocompromised population.   Bortezomib injection What is this medication? BORTEZOMIB (bor TEZ oh mib) targets proteins in cancer cells and stops the cancer cells from growing. It treats multiple myeloma and mantle cell lymphoma. This medicine may be used for other purposes; ask your health care provider or pharmacist if  you have questions. COMMON BRAND NAME(S): Velcade What should I tell my care team before I take this medication? They need to know if you have any of these conditions: dehydration diabetes (high blood sugar) heart disease liver disease tingling of the fingers or toes or other nerve disorder an unusual or  allergic reaction to bortezomib, mannitol, boron, other medicines, foods, dyes, or preservatives pregnant or trying to get pregnant breast-feeding How should I use this medication? This medicine is injected into a vein or under the skin. It is given by a health care provider in a hospital or clinic setting. Talk to your health care provider about the use of this medicine in children. Special care may be needed. Overdosage: If you think you have taken too much of this medicine contact a poison control center or emergency room at once. NOTE: This medicine is only for you. Do not share this medicine with others. What if I miss a dose? Keep appointments for follow-up doses. It is important not to miss your dose. Call your health care provider if you are unable to keep an appointment. What may interact with this medication? This medicine may interact with the following medications: ketoconazole rifampin This list may not describe all possible interactions. Give your health care provider a list of all the medicines, herbs, non-prescription drugs, or dietary supplements you use. Also tell them if you smoke, drink alcohol, or use illegal drugs. Some items may interact with your medicine. What should I watch for while using this medication? Your condition will be monitored carefully while you are receiving this medicine. You may need blood work done while you are taking this medicine. You may get drowsy or dizzy. Do not drive, use machinery, or do anything that needs mental alertness until you know how this medicine affects you. Do not stand up or sit up quickly, especially if you are an older patient. This reduces the risk of dizzy or fainting spells This medicine may increase your risk of getting an infection. Call your health care provider for advice if you get a fever, chills, sore throat, or other symptoms of a cold or flu. Do not treat yourself. Try to avoid being around people who are sick. Check  with your health care provider if you have severe diarrhea, nausea, and vomiting, or if you sweat a lot. The loss of too much body fluid may make it dangerous for you to take this medicine. Do not become pregnant while taking this medicine or for 7 months after stopping it. Women should inform their health care provider if they wish to become pregnant or think they might be pregnant. Men should not father a child while taking this medicine and for 4 months after stopping it. There is a potential for serious harm to an unborn child. Talk to your health care provider for more information. Do not breast-feed an infant while taking this medicine or for 2 months after stopping it. This medicine may make it more difficult to get pregnant or father a child. Talk to your health care provider if you are concerned about your fertility. What side effects may I notice from receiving this medication? Side effects that you should report to your doctor or health care professional as soon as possible: allergic reactions (skin rash; itching or hives; swelling of the face, lips, or tongue) bleeding (bloody or black, tarry stools; red or dark brown urine; spitting up blood or brown material that looks like coffee grounds; red spots on the  skin; unusual bruising or bleeding from the eye, gums, or nose) blurred vision or changes in vision confusion constipation headache heart failure (trouble breathing; fast, irregular heartbeat; sudden weight gain; swelling of the ankles, feet, hands) infection (fever, chills, cough, sore throat, pain or trouble passing urine) lack or loss of appetite liver injury (dark yellow or brown urine; general ill feeling or flu-like symptoms; loss of appetite, right upper belly pain; yellowing of the eyes or skin) low blood pressure (dizziness; feeling faint or lightheaded, falls; unusually weak or tired) muscle cramps pain, redness, or irritation at site where injected pain, tingling,  numbness in the hands or feet seizures trouble breathing unusual bruising or bleeding Side effects that usually do not require medical attention (report to your doctor or health care professional if they continue or are bothersome): diarrhea nausea stomach pain trouble sleeping vomiting This list may not describe all possible side effects. Call your doctor for medical advice about side effects. You may report side effects to FDA at 1-800-FDA-1088. Where should I keep my medication? This medicine is given in a hospital or clinic. It will not be stored at home. NOTE: This sheet is a summary. It may not cover all possible information. If you have questions about this medicine, talk to your doctor, pharmacist, or health care provider.  2022 Elsevier/Gold Standard (2020-08-26 00:00:00)  Daratumumab; Hyaluronidase Injection What is this medication? DARATUMUMAB; HYALURONIDASE (dar a toom ue mab / hye al ur ON i dase) is a monoclonal antibody. Hyaluronidase is used to improve the effects of daratumumab. It treats certain types of cancer. Some of the cancers treated are multiple myeloma and light-chain amyloidosis. This medicine may be used for other purposes; ask your health care provider or pharmacist if you have questions. COMMON BRAND NAME(S): DARZALEX FASPRO What should I tell my care team before I take this medication? They need to know if you have any of these conditions: heart disease infection especially a viral infection such as chickenpox, cold sores, herpes, or hepatitis Ariq lung or breathing disease an unusual or allergic reaction to daratumumab, hyaluronidase, other medicines, foods, dyes, or preservatives pregnant or trying to get pregnant breast-feeding How should I use this medication? This medicine is for injection under the skin. It is given by a health care professional in a hospital or clinic setting. Talk to your pediatrician regarding the use of this medicine in children.  Special care may be needed. Overdosage: If you think you have taken too much of this medicine contact a poison control center or emergency room at once. NOTE: This medicine is only for you. Do not share this medicine with others. What if I miss a dose? Keep appointments for follow-up doses as directed. It is important not to miss your dose. Call your doctor or health care professional if you are unable to keep an appointment. What may interact with this medication? Interactions have not been studied. This list may not describe all possible interactions. Give your health care provider a list of all the medicines, herbs, non-prescription drugs, or dietary supplements you use. Also tell them if you smoke, drink alcohol, or use illegal drugs. Some items may interact with your medicine. What should I watch for while using this medication? Your condition will be monitored carefully while you are receiving this medicine. This medicine can cause serious allergic reactions. To reduce your risk, your health care provider may give you other medicine to take before receiving this one. Be sure to follow the directions from  your health care provider. This medicine can affect the results of blood tests to match your blood type. These changes can last for up to 6 months after the final dose. Your healthcare provider will do blood tests to match your blood type before you start treatment. Tell all of your healthcare providers that you are being treated with this medicine before receiving a blood transfusion. This medicine can affect the results of some tests used to determine treatment response; extra tests may be needed to evaluate response. Do not become pregnant while taking this medicine or for 3 months after stopping it. Women should inform their health care provider if they wish to become pregnant or think they might be pregnant. There is a potential for serious side effects to an unborn child. Talk to your health  care provider for more information. Do not breast-feed an infant while taking this medicine. What side effects may I notice from receiving this medication? Side effects that you should report to your care team as soon as possible: Allergic reactions--skin rash, itching or hives, swelling of the face, lips, or tongue Blood clot--chest pain, shortness of breath, pain, swelling or warmth in the leg Blurred vision Fast, irregular heartbeat Infection--fever, chills, cough, sore throat, pain or trouble passing urine Injection reactions--dizziness, fast heartbeat, feeling faint or lightheaded, falls, headache, increase in blood pressure, nausea, vomiting, or wheezing or trouble breathing with loud or whistling sounds Low red blood cell counts--trouble breathing, feeling faint, lightheaded or falls, unusually weak or tired Unusual bleeding or bruising Side effects that usually do not require medical attention (report these to your care team if they continue or are bothersome): Back pain Constipation Diarrhea Pain, tingling, numbness in the hands or feet Pain, redness, or irritation at site where injected Muscle cramp or pain Swelling of the ankles, feet, hands Tiredness Trouble sleeping This list may not describe all possible side effects. Call your doctor for medical advice about side effects. You may report side effects to FDA at 1-800-FDA-1088. Where should I keep my medication? This drug is given in a hospital or clinic and will not be stored at home. NOTE: This sheet is a summary. It may not cover all possible information. If you have questions about this medicine, talk to your doctor, pharmacist, or health care provider.  2022 Elsevier/Gold Standard (2021-05-24 00:00:00)

## 2021-07-26 ENCOUNTER — Other Ambulatory Visit: Payer: Self-pay | Admitting: *Deleted

## 2021-07-26 ENCOUNTER — Telehealth: Payer: Self-pay | Admitting: *Deleted

## 2021-07-26 DIAGNOSIS — C9000 Multiple myeloma not having achieved remission: Secondary | ICD-10-CM

## 2021-07-26 DIAGNOSIS — R413 Other amnesia: Secondary | ICD-10-CM

## 2021-07-26 DIAGNOSIS — D649 Anemia, unspecified: Secondary | ICD-10-CM

## 2021-07-26 DIAGNOSIS — E039 Hypothyroidism, unspecified: Secondary | ICD-10-CM

## 2021-07-26 NOTE — Telephone Encounter (Signed)
Notified patient that K+ and creatinine are elevated and MD wants him to increase fluid intake. He reports he has been doing so. Since he already has appointment on 11/10 for lab, this will be OK. He sent MyChart message from Richardson Landry, NP at [facility] requesting we coordinate him having a CT brain without contrast along with labs: TSH, CBC, CMP, HIV, Treponema, Vit B12 all done before he sees neurology on 09/02/21 at [facility] for memory impairment issue. This RN called NP and left message requesting diagnosis codes for testing.

## 2021-07-26 NOTE — Telephone Encounter (Signed)
-----   Message from Ladell Pier, MD sent at 07/25/2021  8:21 PM EST ----- Please call patient, creatinine is mildly elevated, potassium also mildly elevated, increase fluid intake, repeat BMP on 07/27/2021

## 2021-07-27 ENCOUNTER — Other Ambulatory Visit: Payer: Self-pay | Admitting: *Deleted

## 2021-07-27 DIAGNOSIS — R413 Other amnesia: Secondary | ICD-10-CM

## 2021-07-28 ENCOUNTER — Other Ambulatory Visit: Payer: Self-pay | Admitting: *Deleted

## 2021-07-28 ENCOUNTER — Other Ambulatory Visit: Payer: Self-pay

## 2021-07-28 ENCOUNTER — Encounter: Payer: Self-pay | Admitting: *Deleted

## 2021-07-28 ENCOUNTER — Inpatient Hospital Stay: Payer: Medicare Other

## 2021-07-28 DIAGNOSIS — Z5112 Encounter for antineoplastic immunotherapy: Secondary | ICD-10-CM | POA: Diagnosis not present

## 2021-07-28 DIAGNOSIS — C9000 Multiple myeloma not having achieved remission: Secondary | ICD-10-CM

## 2021-07-28 DIAGNOSIS — E039 Hypothyroidism, unspecified: Secondary | ICD-10-CM

## 2021-07-28 DIAGNOSIS — R413 Other amnesia: Secondary | ICD-10-CM

## 2021-07-28 LAB — CBC WITH DIFFERENTIAL (CANCER CENTER ONLY)
Abs Immature Granulocytes: 0.01 10*3/uL (ref 0.00–0.07)
Basophils Absolute: 0 10*3/uL (ref 0.0–0.1)
Basophils Relative: 0 %
Eosinophils Absolute: 0.1 10*3/uL (ref 0.0–0.5)
Eosinophils Relative: 1 %
HCT: 32.7 % — ABNORMAL LOW (ref 39.0–52.0)
Hemoglobin: 10.8 g/dL — ABNORMAL LOW (ref 13.0–17.0)
Immature Granulocytes: 0 %
Lymphocytes Relative: 20 %
Lymphs Abs: 1.3 10*3/uL (ref 0.7–4.0)
MCH: 33.1 pg (ref 26.0–34.0)
MCHC: 33 g/dL (ref 30.0–36.0)
MCV: 100.3 fL — ABNORMAL HIGH (ref 80.0–100.0)
Monocytes Absolute: 1.1 10*3/uL — ABNORMAL HIGH (ref 0.1–1.0)
Monocytes Relative: 18 %
Neutro Abs: 3.8 10*3/uL (ref 1.7–7.7)
Neutrophils Relative %: 61 %
Platelet Count: 125 10*3/uL — ABNORMAL LOW (ref 150–400)
RBC: 3.26 MIL/uL — ABNORMAL LOW (ref 4.22–5.81)
RDW: 15.1 % (ref 11.5–15.5)
WBC Count: 6.3 10*3/uL (ref 4.0–10.5)
nRBC: 0 % (ref 0.0–0.2)

## 2021-07-28 LAB — CMP (CANCER CENTER ONLY)
ALT: 11 U/L (ref 0–44)
AST: 17 U/L (ref 15–41)
Albumin: 4.1 g/dL (ref 3.5–5.0)
Alkaline Phosphatase: 44 U/L (ref 38–126)
Anion gap: 9 (ref 5–15)
BUN: 38 mg/dL — ABNORMAL HIGH (ref 8–23)
CO2: 23 mmol/L (ref 22–32)
Calcium: 9.5 mg/dL (ref 8.9–10.3)
Chloride: 104 mmol/L (ref 98–111)
Creatinine: 1.06 mg/dL (ref 0.61–1.24)
GFR, Estimated: >60 mL/min (ref >60)
Glucose, Bld: 86 mg/dL (ref 70–99)
Potassium: 4.1 mmol/L (ref 3.5–5.1)
Sodium: 136 mmol/L (ref 135–145)
Total Bilirubin: 0.3 mg/dL (ref 0.3–1.2)
Total Protein: 6.6 g/dL (ref 6.5–8.1)

## 2021-07-28 LAB — TSH: TSH: 0.014 u[IU]/mL — ABNORMAL LOW (ref 0.350–4.500)

## 2021-07-28 NOTE — Progress Notes (Signed)
Orders placed.

## 2021-07-29 LAB — MISC LABCORP TEST (SEND OUT): LabCorp test name: 12005

## 2021-07-29 LAB — RPR: RPR Ser Ql: NONREACTIVE

## 2021-08-01 ENCOUNTER — Other Ambulatory Visit: Payer: Self-pay | Admitting: *Deleted

## 2021-08-01 ENCOUNTER — Telehealth: Payer: Self-pay

## 2021-08-01 DIAGNOSIS — D649 Anemia, unspecified: Secondary | ICD-10-CM

## 2021-08-01 DIAGNOSIS — Z114 Encounter for screening for human immunodeficiency virus [HIV]: Secondary | ICD-10-CM

## 2021-08-01 DIAGNOSIS — R5383 Other fatigue: Secondary | ICD-10-CM

## 2021-08-01 NOTE — Telephone Encounter (Signed)
-----   Message from Ladell Pier, MD sent at 07/29/2021  6:12 PM EST ----- Please call patient, TSH is low, needs to adjust the thyroid hormone dose with his primary provider, please forward results to his primary provider

## 2021-08-01 NOTE — Progress Notes (Signed)
[facility] provided diagnosis code R53.83 for HIV and Vit B12 test. This code still did not allow order to be placed without a waiver form. Also tried anemia without success. Will inform patient that he may be able to get PCP to order these tests form him. May have better coverage with them. Routed the low TSH to Dr. Alain Marion.

## 2021-08-01 NOTE — Telephone Encounter (Signed)
Pt. Informed. Labs forwarded to Pt's PCP

## 2021-08-02 ENCOUNTER — Telehealth: Payer: Self-pay | Admitting: Internal Medicine

## 2021-08-02 DIAGNOSIS — E291 Testicular hypofunction: Secondary | ICD-10-CM

## 2021-08-02 DIAGNOSIS — E039 Hypothyroidism, unspecified: Secondary | ICD-10-CM

## 2021-08-02 LAB — RPR: RPR Ser Ql: NONREACTIVE — AB

## 2021-08-02 NOTE — Telephone Encounter (Signed)
Patient calling in  Patient says he went to visit his oncologist & oncologist told him to make Dr. Alain Marion aware that his TSH was low  Wants to know if his medication needs to be adjusted  Please call [phone]

## 2021-08-03 NOTE — Telephone Encounter (Signed)
Notified pt w/ MD response. Also made lab appt & f/u appt w/ MD for 08/30/21.Marland KitchenJohny [name]

## 2021-08-03 NOTE — Telephone Encounter (Signed)
Possibly.  Repeat TSH and free T4.  Please see me with the results.  Thanks

## 2021-08-04 ENCOUNTER — Other Ambulatory Visit (INDEPENDENT_AMBULATORY_CARE_PROVIDER_SITE_OTHER): Payer: Medicare Other

## 2021-08-04 ENCOUNTER — Other Ambulatory Visit: Payer: Self-pay

## 2021-08-04 DIAGNOSIS — E039 Hypothyroidism, unspecified: Secondary | ICD-10-CM | POA: Diagnosis not present

## 2021-08-04 LAB — TSH: TSH: 0.02 u[IU]/mL — ABNORMAL LOW (ref 0.35–5.50)

## 2021-08-04 LAB — T4, FREE: Free T4: 0.98 ng/dL (ref 0.60–1.60)

## 2021-08-09 ENCOUNTER — Encounter (HOSPITAL_BASED_OUTPATIENT_CLINIC_OR_DEPARTMENT_OTHER): Payer: Self-pay

## 2021-08-09 ENCOUNTER — Other Ambulatory Visit: Payer: Self-pay

## 2021-08-09 ENCOUNTER — Ambulatory Visit (HOSPITAL_BASED_OUTPATIENT_CLINIC_OR_DEPARTMENT_OTHER)
Admission: RE | Admit: 2021-08-09 | Discharge: 2021-08-09 | Disposition: A | Discharge status: Home / Self Care | Payer: Medicare Other | Source: Ambulatory Visit | Attending: Oncology | Admitting: Oncology

## 2021-08-09 DIAGNOSIS — R413 Other amnesia: Secondary | ICD-10-CM | POA: Insufficient documentation

## 2021-08-10 ENCOUNTER — Encounter: Payer: Self-pay | Admitting: *Deleted

## 2021-08-10 NOTE — Progress Notes (Signed)
Faxed CT brain report to Gypsy Balsam at [facility] [phone]) and requested radiology push over images to [facility] system.

## 2021-08-20 ENCOUNTER — Other Ambulatory Visit: Payer: Self-pay | Admitting: Oncology

## 2021-08-22 ENCOUNTER — Encounter: Payer: Self-pay | Admitting: Oncology

## 2021-08-22 ENCOUNTER — Other Ambulatory Visit: Payer: Self-pay

## 2021-08-22 ENCOUNTER — Inpatient Hospital Stay: Payer: Medicare Other

## 2021-08-22 ENCOUNTER — Other Ambulatory Visit: Payer: Medicare Other

## 2021-08-22 ENCOUNTER — Inpatient Hospital Stay (HOSPITAL_BASED_OUTPATIENT_CLINIC_OR_DEPARTMENT_OTHER): Payer: Medicare Other | Admitting: Oncology

## 2021-08-22 ENCOUNTER — Inpatient Hospital Stay: Payer: Medicare Other | Attending: Oncology

## 2021-08-22 ENCOUNTER — Telehealth: Payer: Self-pay

## 2021-08-22 VITALS — BP 131/71 | HR 69 | Temp 97.8°F | Resp 20 | Ht 69.0 in | Wt 181.2 lb

## 2021-08-22 DIAGNOSIS — C9000 Multiple myeloma not having achieved remission: Secondary | ICD-10-CM | POA: Diagnosis present

## 2021-08-22 DIAGNOSIS — T451X5A Adverse effect of antineoplastic and immunosuppressive drugs, initial encounter: Secondary | ICD-10-CM

## 2021-08-22 DIAGNOSIS — Z5112 Encounter for antineoplastic immunotherapy: Secondary | ICD-10-CM | POA: Insufficient documentation

## 2021-08-22 DIAGNOSIS — Z79899 Other long term (current) drug therapy: Secondary | ICD-10-CM | POA: Insufficient documentation

## 2021-08-22 DIAGNOSIS — Z9481 Bone marrow transplant status: Secondary | ICD-10-CM

## 2021-08-22 LAB — CBC WITH DIFFERENTIAL (CANCER CENTER ONLY)
Abs Immature Granulocytes: 0.01 10*3/uL (ref 0.00–0.07)
Basophils Absolute: 0 10*3/uL (ref 0.0–0.1)
Basophils Relative: 0 %
Eosinophils Absolute: 0.2 10*3/uL (ref 0.0–0.5)
Eosinophils Relative: 4 %
HCT: 33.3 % — ABNORMAL LOW (ref 39.0–52.0)
Hemoglobin: 11 g/dL — ABNORMAL LOW (ref 13.0–17.0)
Immature Granulocytes: 0 %
Lymphocytes Relative: 21 %
Lymphs Abs: 1 10*3/uL (ref 0.7–4.0)
MCH: 32.6 pg (ref 26.0–34.0)
MCHC: 33 g/dL (ref 30.0–36.0)
MCV: 98.8 fL (ref 80.0–100.0)
Monocytes Absolute: 0.7 10*3/uL (ref 0.1–1.0)
Monocytes Relative: 14 %
Neutro Abs: 2.8 10*3/uL (ref 1.7–7.7)
Neutrophils Relative %: 61 %
Platelet Count: 127 10*3/uL — ABNORMAL LOW (ref 150–400)
RBC: 3.37 MIL/uL — ABNORMAL LOW (ref 4.22–5.81)
RDW: 15.3 % (ref 11.5–15.5)
WBC Count: 4.6 10*3/uL (ref 4.0–10.5)
nRBC: 0 % (ref 0.0–0.2)

## 2021-08-22 LAB — CMP (CANCER CENTER ONLY)
ALT: 12 U/L (ref 0–44)
AST: 18 U/L (ref 15–41)
Albumin: 4.3 g/dL (ref 3.5–5.0)
Alkaline Phosphatase: 50 U/L (ref 38–126)
Anion gap: 9 (ref 5–15)
BUN: 31 mg/dL — ABNORMAL HIGH (ref 8–23)
CO2: 23 mmol/L (ref 22–32)
Calcium: 10.3 mg/dL (ref 8.9–10.3)
Chloride: 105 mmol/L (ref 98–111)
Creatinine: 1.03 mg/dL (ref 0.61–1.24)
GFR, Estimated: >60 mL/min (ref >60)
Glucose, Bld: 84 mg/dL (ref 70–99)
Potassium: 4.2 mmol/L (ref 3.5–5.1)
Sodium: 137 mmol/L (ref 135–145)
Total Bilirubin: 0.4 mg/dL (ref 0.3–1.2)
Total Protein: 6.6 g/dL (ref 6.5–8.1)

## 2021-08-22 MED ORDER — DEXAMETHASONE 4 MG PO TABS
10.0000 mg | ORAL_TABLET | Freq: Once | ORAL | Status: AC
  Administered 2021-08-22: 10 mg via ORAL
  Filled 2021-08-22: qty 3

## 2021-08-22 MED ORDER — ACETAMINOPHEN 325 MG PO TABS
650.0000 mg | ORAL_TABLET | Freq: Once | ORAL | Status: AC
  Administered 2021-08-22: 650 mg via ORAL
  Filled 2021-08-22: qty 2

## 2021-08-22 MED ORDER — DARATUMUMAB-HYALURONIDASE-FIHJ 1800-30000 MG-UT/15ML ~~LOC~~ SOLN
1800.0000 mg | Freq: Once | SUBCUTANEOUS | Status: AC
  Administered 2021-08-22: 1800 mg via SUBCUTANEOUS
  Filled 2021-08-22: qty 15

## 2021-08-22 MED ORDER — DIPHENHYDRAMINE HCL 25 MG PO CAPS
25.0000 mg | ORAL_CAPSULE | Freq: Once | ORAL | Status: AC
  Administered 2021-08-22: 25 mg via ORAL
  Filled 2021-08-22: qty 1

## 2021-08-22 MED ORDER — BORTEZOMIB CHEMO SQ INJECTION 3.5 MG (2.5MG/ML)
1.3000 mg/m2 | Freq: Once | INTRAMUSCULAR | Status: AC
  Administered 2021-08-22: 2.5 mg via SUBCUTANEOUS
  Filled 2021-08-22: qty 1

## 2021-08-22 NOTE — Progress Notes (Signed)
[facility] OFFICE PROGRESS NOTE   Diagnosis: Multiple myeloma  INTERVAL HISTORY:   Andre. Quadros returns as scheduled.  He continues monthly Velcade/daratumumab.  No dyspnea or rash.  No peripheral neuropathy symptoms.  He is undergoing evaluation by neurology for memory loss.  Objective:  Vital signs in last 24 hours:  Blood pressure 131/71, pulse 69, temperature 97.8 F (36.6 C), temperature source Oral, resp. rate 20, height 5' 9"  (1.753 m), weight 181 lb 3.2 oz (82.2 kg), SpO2 99 %.    HEENT: No thrush or ulcers Resp: Lungs clear bilaterally Cardio: Regular rate and rhythm GI: No hepatosplenomegaly Vascular: Trace ankle edema bilaterally   Lab Results:  Lab Results  Component Value Date   WBC 4.6 08/22/2021   HGB 11.0 (L) 08/22/2021   HCT 33.3 (L) 08/22/2021   MCV 98.8 08/22/2021   PLT 127 (L) 08/22/2021   NEUTROABS 2.8 08/22/2021    CMP  Lab Results  Component Value Date   NA 137 08/22/2021   K 4.2 08/22/2021   CL 105 08/22/2021   CO2 23 08/22/2021   GLUCOSE 84 08/22/2021   BUN 31 (H) 08/22/2021   CREATININE 1.03 08/22/2021   CALCIUM 10.3 08/22/2021   PROT 6.6 08/22/2021   ALBUMIN 4.3 08/22/2021   AST 18 08/22/2021   ALT 12 08/22/2021   ALKPHOS 50 08/22/2021   BILITOT 0.4 08/22/2021   GFRNONAA >60 08/22/2021   GFRAA >60 06/15/2020     Medications: I have reviewed the patient's current medications.   Assessment/Plan: Multiple myeloma- IgG lambda  bortezomib (subcutaneously), lenalidomide, and dexamethasone, with repeat bone marrow biopsy May of 2012 showing 10% plasmacytosis   (2) high-dose chemotherapy with BCNU and melphalan at [facility], followed by stem cell rescue July of 2012   (3) on zoledronic acid started December of 2012, initially monthly, currently given every 3 months, most recent dose  12/07/2015   (4) low-dose lenalidomide resumed April 2013, interrupted several times.  Resumed again on 02/19/2013, ata dose of 5 mg  daily, 21 days on and 7 days off, later further reduced to 7 days on, 7 days off   (5) CNS symptoms and abnormal brain MRI extensively evaluated by neurology with no definitive diagnosis established   (6) rising M spike noted June 2014 but did not meet criteria for carfilzomib study   (7) on lenalidomide 25 mg daily, 14 days on, 7 days off, starting 04/18/2013, interrupted December 2014 because of rash;             (a) resumed January 2015 at 10 mg/ day at 21 days on/ 7 days off             (Khyree) starting 08/18/2014 decreased to 10 mg/ day 14 days on, 7 days off because of cytopenias             (c) as of February 2016 was on 5 mg daily 7 days on 7 days off             (d) lenalidomide discontinued December 2016 with evidence of disease progression   (8) transient global amnesia 05/29/2015, resolved without intervention   (9) starting PVD 10/25/2015 w ASA 325 thromboprophylaxis, valacyclovir prophylaxis, last dose 12/17/2015             (a) pomalidomide 4 mg/d days 1-14             (Kurtis) bortezomib sQ days 2,5,9,12 of each 21 day cycle             (  c) dexamethasome 20 mg two days a week             (d) dexamethasone bortezomib and pomalidomide discontinued late December 2018 with poor tolerance   (10) metapneumovirus pneumonia April 2017             (a) completing course of steroids and week of bactrim mid April 2017   (11) status post second autologous transplant at [facility] 02/04/2016(preparatory regimen melphalan 200 mg/m)             (a) received twelve-month vaccinations 03/14/2017 (DPT, Haemophilus, Pneumovax 13, polio)             (Leveon) 14 months injections 05/04/2016 include DTaP, Hib conjugate, HepB energex Samael 20 mcg/ml, Prevnar 13             (c) 24 month vaccines due at [facility] June 2019   (12) maintenance therapy started November 2017, consisting of             (a) bortezomib 1.3 mg/M2 every 14 days, first dose 07/27/2016             (Shonte) pomalidomide 1 mg days 1-21 Q28 days, started  07/19/2016             (c) zolendronate monthly started 07/27/2016 (previously Q 3 months) however patient unable to tolerate, and changed back to q3 months in April, 2018             (d) Bortezomib changed to monthly as of June 2018 because of tolerance issues, however discontinued after 09/14/2017 dose because of a rise in his M spike             (e) pomalidomide held after 10/18/2017 in preparation for possible study at [facility] (venetoclax)--never resumed             (f) with numbers actually improved off treatment, resumed every 2-week bortezomib 12/25/2017             (g) changed to every 3-week bortezomib as of 03/17/2019             (h) briefly on weekly treatments times 22 October 2020 due to increase in M spike             (I) maintenance therapy discontinued with evidence of progression   (13) bortezomib/daratumumab/dexamethasone started 12/20/2020.             (a) Decadron dose dropped to 10 mg day of and day following treatment starting May 2022             (Vaibhav) day 8 bortezomib omitted beginning with the June cycle per patient preference             (C) treatment changed to monthly daratumumab/Velcade/Decadron beginning 06/27/2021-patient decision     Disposition: Andre [name] returns as scheduled.  He appears stable.  He will continue monthly Velcade/daratumumab.  We will follow-up on the myeloma panel from today.  He will return for an office visit in 1 month.  He will receive Zometa when he is here next month.  Andre Pangilinan will follow up with neurology for evaluation of his mental status.  Betsy Coder, MD  08/22/2021  10:56 AM

## 2021-08-22 NOTE — Patient Instructions (Signed)
[facility]   °Discharge Instructions: °Thank you for choosing [facility] to provide your oncology and hematology care.  ° °If you have a lab appointment with the Cancer Center, please go directly to the Cancer Center and check in at the registration area. °  °Wear comfortable clothing and clothing appropriate for easy access to any Portacath or PICC line.  ° °We strive to give you quality time with your provider. You may need to reschedule your appointment if you arrive late (15 or more minutes).  Arriving late affects you and other patients whose appointments are after yours.  Also, if you miss three or more appointments without notifying the office, you may be dismissed from the clinic at the provider’s discretion.    °  °For prescription refill requests, have your pharmacy contact our office and allow 72 hours for refills to be completed.   ° °Today you received the following chemotherapy and/or immunotherapy agents Bortezomib (VELCADE) & Daratumumab-hyaluronidase-fihj (DARZALEX FASPRO).    °  °To help prevent nausea and vomiting after your treatment, we encourage you to take your nausea medication as directed. ° °BELOW ARE SYMPTOMS THAT SHOULD BE REPORTED IMMEDIATELY: °*FEVER GREATER THAN 100.4 F (38 °C) OR HIGHER °*CHILLS OR SWEATING °*NAUSEA AND VOMITING THAT IS NOT CONTROLLED WITH YOUR NAUSEA MEDICATION °*UNUSUAL SHORTNESS OF BREATH °*UNUSUAL BRUISING OR BLEEDING °*URINARY PROBLEMS (pain or burning when urinating, or frequent urination) °*BOWEL PROBLEMS (unusual diarrhea, constipation, pain near the anus) °TENDERNESS IN MOUTH AND THROAT WITH OR WITHOUT PRESENCE OF ULCERS (sore throat, sores in mouth, or a toothache) °UNUSUAL RASH, SWELLING OR PAIN  °UNUSUAL VAGINAL DISCHARGE OR ITCHING  ° °Items with * indicate a potential emergency and should be followed up as soon as possible or go to the Emergency Department if any problems should occur. ° °Please show the  CHEMOTHERAPY ALERT CARD or IMMUNOTHERAPY ALERT CARD at check-in to the Emergency Department and triage nurse. ° °Should you have questions after your visit or need to cancel or reschedule your appointment, please contact [facility]  Dept: [phone]  and follow the prompts.  Office hours are 8:00 a.m. to 4:30 p.m. Monday - Friday. Please note that voicemails left after 4:00 p.m. may not be returned until the following business day.  We are closed weekends and major holidays. You have access to a nurse at all times for urgent questions. Please call the main number to the clinic Dept: [phone] and follow the prompts. ° ° °For any non-urgent questions, you may also contact your provider using MyChart. We now offer e-Visits for anyone 18 and older to request care online for non-urgent symptoms. For details visit [email or web address]. °  °Also download the MyChart app! Go to the app store, search "MyChart", open the app, select [facility], and log in with your MyChart username and password. ° °Due to Covid, a mask is required upon entering the hospital/clinic. If you do not have a mask, one will be given to you upon arrival. For doctor visits, patients may have 1 support person aged 18 or older with them. For treatment visits, patients cannot have anyone with them due to current Covid guidelines and our immunocompromised population.  ° °Bortezomib injection °What is this medication? °BORTEZOMIB (bor TEZ oh mib) targets proteins in cancer cells and stops the cancer cells from growing. It treats multiple myeloma and mantle cell lymphoma. °This medicine may be used for other purposes; ask your health care   provider or pharmacist if you have questions. °COMMON BRAND NAME(S): Velcade °What should I tell my care team before I take this medication? °They need to know if you have any of these conditions: °dehydration °diabetes (high blood sugar) °heart disease °liver disease °tingling of the  fingers or toes or other nerve disorder °an unusual or allergic reaction to bortezomib, mannitol, boron, other medicines, foods, dyes, or preservatives °pregnant or trying to get pregnant °breast-feeding °How should I use this medication? °This medicine is injected into a vein or under the skin. It is given by a health care provider in a hospital or clinic setting. °Talk to your health care provider about the use of this medicine in children. Special care may be needed. °Overdosage: If you think you have taken too much of this medicine contact a poison control center or emergency room at once. °NOTE: This medicine is only for you. Do not share this medicine with others. °What if I miss a dose? °Keep appointments for follow-up doses. It is important not to miss your dose. Call your health care provider if you are unable to keep an appointment. °What may interact with this medication? °This medicine may interact with the following medications: °ketoconazole °rifampin °This list may not describe all possible interactions. Give your health care provider a list of all the medicines, herbs, non-prescription drugs, or dietary supplements you use. Also tell them if you smoke, drink alcohol, or use illegal drugs. Some items may interact with your medicine. °What should I watch for while using this medication? °Your condition will be monitored carefully while you are receiving this medicine. °You may need blood work done while you are taking this medicine. °You may get drowsy or dizzy. Do not drive, use machinery, or do anything that needs mental alertness until you know how this medicine affects you. Do not stand up or sit up quickly, especially if you are an older patient. This reduces the risk of dizzy or fainting spells °This medicine may increase your risk of getting an infection. Call your health care provider for advice if you get a fever, chills, sore throat, or other symptoms of a cold or flu. Do not treat yourself.  Try to avoid being around people who are sick. °Check with your health care provider if you have severe diarrhea, nausea, and vomiting, or if you sweat a lot. The loss of too much body fluid may make it dangerous for you to take this medicine. °Do not become pregnant while taking this medicine or for 7 months after stopping it. Women should inform their health care provider if they wish to become pregnant or think they might be pregnant. Men should not father a child while taking this medicine and for 4 months after stopping it. There is a potential for serious harm to an unborn child. Talk to your health care provider for more information. Do not breast-feed an infant while taking this medicine or for 2 months after stopping it. °This medicine may make it more difficult to get pregnant or father a child. Talk to your health care provider if you are concerned about your fertility. °What side effects may I notice from receiving this medication? °Side effects that you should report to your doctor or health care professional as soon as possible: °allergic reactions (skin rash; itching or hives; swelling of the face, lips, or tongue) °bleeding (bloody or black, tarry stools; red or dark brown urine; spitting up blood or brown material that looks like coffee grounds;   red spots on the skin; unusual bruising or bleeding from the eye, gums, or nose) °blurred vision or changes in vision °confusion °constipation °headache °heart failure (trouble breathing; fast, irregular heartbeat; sudden weight gain; swelling of the ankles, feet, hands) °infection (fever, chills, cough, sore throat, pain or trouble passing urine) °lack or loss of appetite °liver injury (dark yellow or brown urine; general ill feeling or flu-like symptoms; loss of appetite, right upper belly pain; yellowing of the eyes or skin) °low blood pressure (dizziness; feeling faint or lightheaded, falls; unusually weak or tired) °muscle cramps °pain, redness, or  irritation at site where injected °pain, tingling, numbness in the hands or feet °seizures °trouble breathing °unusual bruising or bleeding °Side effects that usually do not require medical attention (report to your doctor or health care professional if they continue or are bothersome): °diarrhea °nausea °stomach pain °trouble sleeping °vomiting °This list may not describe all possible side effects. Call your doctor for medical advice about side effects. You may report side effects to FDA at 1-800-FDA-1088. °Where should I keep my medication? °This medicine is given in a hospital or clinic. It will not be stored at home. °NOTE: This sheet is a summary. It may not cover all possible information. If you have questions about this medicine, talk to your doctor, pharmacist, or health care provider. °© 2022 Elsevier/Gold Standard (2020-08-26 00:00:00) ° °Daratumumab; Hyaluronidase Injection °What is this medication? °DARATUMUMAB; HYALURONIDASE (dar a toom ue mab / hye al ur ON i dase) is a monoclonal antibody. Hyaluronidase is used to improve the effects of daratumumab. It treats certain types of cancer. Some of the cancers treated are multiple myeloma and light-chain amyloidosis. °This medicine may be used for other purposes; ask your health care provider or pharmacist if you have questions. °COMMON BRAND NAME(S): DARZALEX FASPRO °What should I tell my care team before I take this medication? °They need to know if you have any of these conditions: °heart disease °infection especially a viral infection such as chickenpox, cold sores, herpes, or hepatitis Dixon °lung or breathing disease °an unusual or allergic reaction to daratumumab, hyaluronidase, other medicines, foods, dyes, or preservatives °pregnant or trying to get pregnant °breast-feeding °How should I use this medication? °This medicine is for injection under the skin. It is given by a health care professional in a hospital or clinic setting. °Talk to your  pediatrician regarding the use of this medicine in children. Special care may be needed. °Overdosage: If you think you have taken too much of this medicine contact a poison control center or emergency room at once. °NOTE: This medicine is only for you. Do not share this medicine with others. °What if I miss a dose? °Keep appointments for follow-up doses as directed. It is important not to miss your dose. Call your doctor or health care professional if you are unable to keep an appointment. °What may interact with this medication? °Interactions have not been studied. °This list may not describe all possible interactions. Give your health care provider a list of all the medicines, herbs, non-prescription drugs, or dietary supplements you use. Also tell them if you smoke, drink alcohol, or use illegal drugs. Some items may interact with your medicine. °What should I watch for while using this medication? °Your condition will be monitored carefully while you are receiving this medicine. °This medicine can cause serious allergic reactions. To reduce your risk, your health care provider may give you other medicine to take before receiving this one. Be sure to   follow the directions from your health care provider. °This medicine can affect the results of blood tests to match your blood type. These changes can last for up to 6 months after the final dose. Your healthcare provider will do blood tests to match your blood type before you start treatment. Tell all of your healthcare providers that you are being treated with this medicine before receiving a blood transfusion. °This medicine can affect the results of some tests used to determine treatment response; extra tests may be needed to evaluate response. °Do not become pregnant while taking this medicine or for 3 months after stopping it. Women should inform their health care provider if they wish to become pregnant or think they might be pregnant. There is a potential for  serious side effects to an unborn child. Talk to your health care provider for more information. Do not breast-feed an infant while taking this medicine. °What side effects may I notice from receiving this medication? °Side effects that you should report to your care team as soon as possible: °Allergic reactions--skin rash, itching or hives, swelling of the face, lips, or tongue °Blood clot--chest pain, shortness of breath, pain, swelling or warmth in the leg °Blurred vision °Fast, irregular heartbeat °Infection--fever, chills, cough, sore throat, pain or trouble passing urine °Injection reactions--dizziness, fast heartbeat, feeling faint or lightheaded, falls, headache, increase in blood pressure, nausea, vomiting, or wheezing or trouble breathing with loud or whistling sounds °Low red blood cell counts--trouble breathing, feeling faint, lightheaded or falls, unusually weak or tired °Unusual bleeding or bruising °Side effects that usually do not require medical attention (report these to your care team if they continue or are bothersome): °Back pain °Constipation °Diarrhea °Pain, tingling, numbness in the hands or feet °Pain, redness, or irritation at site where injected °Muscle cramp or pain °Swelling of the ankles, feet, hands °Tiredness °Trouble sleeping °This list may not describe all possible side effects. Call your doctor for medical advice about side effects. You may report side effects to FDA at 1-800-FDA-1088. °Where should I keep my medication? °This drug is given in a hospital or clinic and will not be stored at home. °NOTE: This sheet is a summary. It may not cover all possible information. If you have questions about this medicine, talk to your doctor, pharmacist, or health care provider. °© 2022 Elsevier/Gold Standard (2021-05-24 00:00:00) ° °

## 2021-08-22 NOTE — Telephone Encounter (Signed)
Patient seen by Dr. Sherrill today ? ?Vitals are within treatment parameters. ? ?Labs reviewed by Dr. Sherrill and are within treatment parameters. ? ?Per physician team, patient is ready for treatment and there are NO modifications to the treatment plan.  ?

## 2021-08-22 NOTE — Progress Notes (Signed)
Patient presents for treatment. RN assessment completed along with the following:  Labs/vitals reviewed - Yes, and within treatment parameters.   Weight within 10% of previous measurement - Yes Oncology Treatment Attestation completed for current therapy- Yes, on date 12/20/2020 Informed consent completed and reflects current therapy/intent - Yes, on date 03/23/2021             Provider progress note reviewed - Today's provider note is not yet available. I reviewed the most recent oncology provider progress note in chart dated 07/25/2021. Treatment/Antibody/Supportive plan reviewed - Yes, and there are no adjustments needed for today's treatment. S&H and other orders reviewed - Yes, and there are no additional orders identified. Previous treatment date reviewed - Yes, and the appropriate amount of time has elapsed between treatments. Clinic Hand Off Received from - [facility] Nurse Note.  Patient to proceed with treatment.

## 2021-08-23 ENCOUNTER — Encounter: Payer: Self-pay | Admitting: Oncology

## 2021-08-23 ENCOUNTER — Other Ambulatory Visit (HOSPITAL_BASED_OUTPATIENT_CLINIC_OR_DEPARTMENT_OTHER): Payer: Self-pay

## 2021-08-23 LAB — KAPPA/LAMBDA LIGHT CHAINS
Kappa free light chain: 10.5 mg/L (ref 3.3–19.4)
Kappa, lambda light chain ratio: 0.14 — ABNORMAL LOW (ref 0.26–1.65)
Lambda free light chains: 73.1 mg/L — ABNORMAL HIGH (ref 5.7–26.3)

## 2021-08-23 LAB — IGG: IgG (Immunoglobin G), Serum: 990 mg/dL (ref 603–1613)

## 2021-08-23 MED FILL — Levothyroxine Sodium Tab 150 MCG: ORAL | 90 days supply | Qty: 90 | Fill #1 | Status: AC

## 2021-08-23 MED FILL — Pantoprazole Sodium EC Tab 40 MG (Base Equiv): ORAL | 90 days supply | Qty: 180 | Fill #1 | Status: AC

## 2021-08-24 ENCOUNTER — Other Ambulatory Visit (HOSPITAL_BASED_OUTPATIENT_CLINIC_OR_DEPARTMENT_OTHER): Payer: Self-pay

## 2021-08-30 ENCOUNTER — Other Ambulatory Visit: Payer: Self-pay

## 2021-08-30 ENCOUNTER — Encounter: Payer: Self-pay | Admitting: Internal Medicine

## 2021-08-30 ENCOUNTER — Ambulatory Visit (INDEPENDENT_AMBULATORY_CARE_PROVIDER_SITE_OTHER): Payer: Medicare Other | Admitting: Internal Medicine

## 2021-08-30 DIAGNOSIS — R413 Other amnesia: Secondary | ICD-10-CM | POA: Diagnosis not present

## 2021-08-30 DIAGNOSIS — G3184 Mild cognitive impairment, so stated: Secondary | ICD-10-CM | POA: Insufficient documentation

## 2021-08-30 DIAGNOSIS — R5383 Other fatigue: Secondary | ICD-10-CM | POA: Diagnosis not present

## 2021-08-30 DIAGNOSIS — K219 Gastro-esophageal reflux disease without esophagitis: Secondary | ICD-10-CM | POA: Diagnosis not present

## 2021-08-30 DIAGNOSIS — E039 Hypothyroidism, unspecified: Secondary | ICD-10-CM

## 2021-08-30 DIAGNOSIS — F5101 Primary insomnia: Secondary | ICD-10-CM

## 2021-08-30 NOTE — Progress Notes (Signed)
Subjective:  Patient ID: Andre [name], male    DOB: 29-Jan-1951  Age: 70 y.o. MRN: [id number]  CC: Follow-up (F/U on labs)   HPI Andre [name] presents for MM, fatigue, depression f/u. C/o poor memory - Neurology appt at [facility] for memory loss.   Outpatient Medications Prior to Visit  Medication Sig Dispense Refill   calcium-vitamin D (OSCAL WITH D) 500-200 MG-UNIT tablet Take 1 tablet by mouth daily.     cetirizine (ZYRTEC) 10 MG tablet Take 10 mg by mouth daily.     Cholecalciferol (VITAMIN D3) 2000 units capsule Take 1 capsule (2,000 Units total) by mouth daily. 100 capsule 3   dexamethasone (DECADRON) 4 MG tablet Take 5 tablets by mouth in the morning after breakfast for chemotherapy 60 tablet 1   diphenhydrAMINE (BENADRYL) 25 mg capsule Take 50 mg by mouth every 6 (six) hours as needed. Takes 2 benadryl at bedtime for itching     docusate sodium (COLACE) 100 MG capsule Take 100 mg by mouth daily as needed for mild constipation or moderate constipation.     escitalopram (LEXAPRO) 10 MG tablet Take 1 tablet (10 mg total) by mouth daily. 90 tablet 3   HYDROCORTISONE ACE, RECTAL, 30 MG SUPP 1 pr bid x 7 d prn 14 suppository 3   hydrocortisone valerate cream (WESTCORT) 0.2 % Apply 1 application topically 2 (two) times daily. 60 g 2   levothyroxine (SYNTHROID) 150 MCG tablet TAKE 1 TABLET BY MOUTH EVERY MORNING ON AN EMPTY STOMACH 90 tablet 3   LORazepam (ATIVAN) 0.5 MG tablet Take 1 tablet (0.5 mg total) by mouth at bedtime as needed for anxiety. 20 tablet 0   ondansetron (ZOFRAN) 8 MG tablet TAKE 1 TABLET(8 MG) BY MOUTH EVERY 8 HOURS AS NEEDED FOR NAUSEA OR VOMITING 20 tablet 3   pantoprazole (PROTONIX) 40 MG tablet TAKE 1 TABLET BY MOUTH TWICE DAILY BEFORE MEALS 180 tablet 3   prochlorperazine (COMPAZINE) 10 MG tablet Take 1 tablet (10 mg total) by mouth every 6 (six) hours as needed (Nausea or vomiting). 30 tablet 1   triamcinolone ointment (KENALOG) 0.1 % Apply 1 application  topically 2 (two) times daily. 80 g 2   valACYclovir (VALTREX) 1000 MG tablet TAKE 1 TABLET(1000 MG) BY MOUTH DAILY 90 tablet 3   zolendronic acid (ZOMETA) 4 MG/5ML injection Inject 4 mg into the vein every 3 (three) months.     valACYclovir (VALTREX) 1000 MG tablet Take 1 tablet by mouth once daily 90 tablet 2   COVID-19 mRNA bivalent vaccine, Pfizer, (PFIZER COVID-19 VAC BIVALENT) injection Inject into the muscle. 0.3 mL 0   No facility-administered medications prior to visit.    ROS: Review of Systems  Constitutional:  Positive for fatigue. Negative for appetite change and unexpected weight change.  HENT:  Negative for congestion, nosebleeds, sneezing, sore throat and trouble swallowing.   Eyes:  Negative for itching and visual disturbance.  Respiratory:  Negative for cough.   Cardiovascular:  Negative for chest pain, palpitations and leg swelling.  Gastrointestinal:  Negative for abdominal distention, blood in stool, diarrhea and nausea.  Genitourinary:  Negative for frequency and hematuria.  Musculoskeletal:  Negative for back pain, gait problem, joint swelling and neck pain.  Skin:  Negative for rash.  Neurological:  Negative for dizziness, tremors, speech difficulty and weakness.  Psychiatric/Behavioral:  Negative for agitation, dysphoric mood, sleep disturbance and suicidal ideas. The patient is not nervous/anxious.    Objective:  BP 130/72 (BP Location:  Left Arm)    Pulse 81    Temp 98.2 F (36.8 C) (Oral)    Ht 5' 9"  (1.753 m)    SpO2 97%    BMI 26.76 kg/m   BP Readings from Last 3 Encounters:  08/30/21 130/72  08/22/21 131/71  07/25/21 114/64    Wt Readings from Last 3 Encounters:  08/22/21 181 lb 3.2 oz (82.2 kg)  07/25/21 182 lb (82.6 kg)  06/27/21 181 lb 12.8 oz (82.5 kg)    Physical Exam Constitutional:      General: He is not in acute distress.    Appearance: He is well-developed.     Comments: NAD  Eyes:     Conjunctiva/sclera: Conjunctivae normal.      Pupils: Pupils are equal, round, and reactive to light.  Neck:     Thyroid: No thyromegaly.     Vascular: No JVD.  Cardiovascular:     Rate and Rhythm: Normal rate and regular rhythm.     Heart sounds: Normal heart sounds. No murmur heard.   No friction rub. No gallop.  Pulmonary:     Effort: Pulmonary effort is normal. No respiratory distress.     Breath sounds: Normal breath sounds. No wheezing or rales.  Chest:     Chest wall: No tenderness.  Abdominal:     General: Bowel sounds are normal. There is no distension.     Palpations: Abdomen is soft. There is no mass.     Tenderness: There is no abdominal tenderness. There is no guarding or rebound.  Musculoskeletal:        General: No tenderness. Normal range of motion.     Cervical back: Normal range of motion.  Lymphadenopathy:     Cervical: No cervical adenopathy.  Skin:    General: Skin is warm and dry.     Findings: No rash.  Neurological:     Mental Status: He is alert and oriented to person, place, and time.     Cranial Nerves: No cranial nerve deficit.     Motor: No abnormal muscle tone.     Coordination: Coordination normal.     Gait: Gait normal.     Deep Tendon Reflexes: Reflexes are normal and symmetric.  Psychiatric:        Behavior: Behavior normal.        Thought Content: Thought content normal.        Judgment: Judgment normal.    Lab Results  Component Value Date   WBC 4.6 08/22/2021   HGB 11.0 (L) 08/22/2021   HCT 33.3 (L) 08/22/2021   PLT 127 (L) 08/22/2021   GLUCOSE 84 08/22/2021   CHOL 164 09/06/2017   TRIG 194.0 (H) 09/06/2017   HDL 38.60 (L) 09/06/2017   LDLDIRECT 155.8 11/13/2012   LDLCALC 86 09/06/2017   ALT 12 08/22/2021   AST 18 08/22/2021   NA 137 08/22/2021   K 4.2 08/22/2021   CL 105 08/22/2021   CREATININE 1.03 08/22/2021   BUN 31 (H) 08/22/2021   CO2 23 08/22/2021   TSH 0.02 (L) 08/04/2021   PSA 1.76 09/06/2017   INR 1.07 02/27/2016   HGBA1C 5.4 03/27/2012    CT HEAD WO  CONTRAST (5MM)  Result Date: 08/10/2021 CLINICAL DATA:  Memory loss.  History of multiple myeloma. EXAM: CT HEAD WITHOUT CONTRAST TECHNIQUE: Contiguous axial images were obtained from the base of the skull through the vertex without intravenous contrast. COMPARISON:  06/15/2020 FINDINGS: Brain: No evidence of acute infarction, hemorrhage,  hydrocephalus, extra-axial collection or mass lesion/mass effect. Small remote left cerebellar infarction. Chronic small vessel ischemia in the hemispheric white matter, mild for age. Generalized cortical volume loss Vascular: No hyperdense vessel or unexpected calcification. Skull: Diffuse heterogeneity of the skull which is likely related to myeloma, and was also seen on prior. No discrete lesion or inner table erosion. Sinuses/Orbits: Negative IMPRESSION: Stable from 2021.  No specific or reversible cause for symptoms. Electronically Signed   By: Jorje Guild M.D.   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  Cont on Protonix      Hypothyroidism    Continue on Levothroid      Insomnia    Taking Tylenol prn.  It seems to be helping with insomnia      Memory problem    C/o poor memory - Neurology appt at [facility] for memory loss         No orders of the defined types were placed in this encounter.     Follow-up: Return in about 6 months (around 02/28/2022) for Wellness Exam.  [name], MD

## 2021-08-30 NOTE — Assessment & Plan Note (Signed)
C/o poor memory - Neurology appt at [facility] for memory loss

## 2021-08-30 NOTE — Assessment & Plan Note (Addendum)
Stable.  Cont on Protonix

## 2021-08-30 NOTE — Assessment & Plan Note (Signed)
Continue on Levothroid

## 2021-08-30 NOTE — Assessment & Plan Note (Addendum)
Fluctuating CFS symptoms- likely MM and chemo related

## 2021-08-30 NOTE — Assessment & Plan Note (Addendum)
Taking Tylenol prn.  It seems to be helping with insomnia

## 2021-09-19 ENCOUNTER — Other Ambulatory Visit: Payer: Self-pay | Admitting: Oncology

## 2021-09-20 ENCOUNTER — Other Ambulatory Visit: Payer: Self-pay

## 2021-09-20 ENCOUNTER — Other Ambulatory Visit: Payer: Self-pay | Admitting: Oncology

## 2021-09-20 ENCOUNTER — Other Ambulatory Visit: Payer: Medicare Other

## 2021-09-20 ENCOUNTER — Inpatient Hospital Stay (HOSPITAL_BASED_OUTPATIENT_CLINIC_OR_DEPARTMENT_OTHER): Payer: Medicare Other | Admitting: Oncology

## 2021-09-20 ENCOUNTER — Other Ambulatory Visit (HOSPITAL_BASED_OUTPATIENT_CLINIC_OR_DEPARTMENT_OTHER): Payer: Self-pay

## 2021-09-20 ENCOUNTER — Inpatient Hospital Stay: Payer: Medicare Other | Attending: Oncology

## 2021-09-20 ENCOUNTER — Inpatient Hospital Stay: Payer: Medicare Other

## 2021-09-20 VITALS — BP 121/69 | HR 86 | Temp 98.1°F | Resp 20 | Ht 69.0 in | Wt 183.2 lb

## 2021-09-20 VITALS — BP 129/59 | HR 62 | Resp 18

## 2021-09-20 DIAGNOSIS — Z9481 Bone marrow transplant status: Secondary | ICD-10-CM

## 2021-09-20 DIAGNOSIS — C9000 Multiple myeloma not having achieved remission: Secondary | ICD-10-CM

## 2021-09-20 DIAGNOSIS — Z5112 Encounter for antineoplastic immunotherapy: Secondary | ICD-10-CM | POA: Diagnosis not present

## 2021-09-20 DIAGNOSIS — Z79899 Other long term (current) drug therapy: Secondary | ICD-10-CM | POA: Diagnosis not present

## 2021-09-20 DIAGNOSIS — G620 Drug-induced polyneuropathy: Secondary | ICD-10-CM

## 2021-09-20 LAB — CMP (CANCER CENTER ONLY)
ALT: 13 U/L (ref 0–44)
AST: 19 U/L (ref 15–41)
Albumin: 4 g/dL (ref 3.5–5.0)
Alkaline Phosphatase: 49 U/L (ref 38–126)
Anion gap: 11 (ref 5–15)
BUN: 32 mg/dL — ABNORMAL HIGH (ref 8–23)
CO2: 19 mmol/L — ABNORMAL LOW (ref 22–32)
Calcium: 9.2 mg/dL (ref 8.9–10.3)
Chloride: 103 mmol/L (ref 98–111)
Creatinine: 1.08 mg/dL (ref 0.61–1.24)
GFR, Estimated: >60 mL/min (ref >60)
Glucose, Bld: 92 mg/dL (ref 70–99)
Potassium: 4.3 mmol/L (ref 3.5–5.1)
Sodium: 133 mmol/L — ABNORMAL LOW (ref 135–145)
Total Bilirubin: 0.3 mg/dL (ref 0.3–1.2)
Total Protein: 6.7 g/dL (ref 6.5–8.1)

## 2021-09-20 LAB — CBC WITH DIFFERENTIAL (CANCER CENTER ONLY)
Abs Immature Granulocytes: 0.01 10*3/uL (ref 0.00–0.07)
Basophils Absolute: 0 10*3/uL (ref 0.0–0.1)
Basophils Relative: 0 %
Eosinophils Absolute: 0.1 10*3/uL (ref 0.0–0.5)
Eosinophils Relative: 2 %
HCT: 32.9 % — ABNORMAL LOW (ref 39.0–52.0)
Hemoglobin: 10.8 g/dL — ABNORMAL LOW (ref 13.0–17.0)
Immature Granulocytes: 0 %
Lymphocytes Relative: 14 %
Lymphs Abs: 0.8 10*3/uL (ref 0.7–4.0)
MCH: 32.5 pg (ref 26.0–34.0)
MCHC: 32.8 g/dL (ref 30.0–36.0)
MCV: 99.1 fL (ref 80.0–100.0)
Monocytes Absolute: 0.9 10*3/uL (ref 0.1–1.0)
Monocytes Relative: 15 %
Neutro Abs: 3.9 10*3/uL (ref 1.7–7.7)
Neutrophils Relative %: 69 %
Platelet Count: 113 10*3/uL — ABNORMAL LOW (ref 150–400)
RBC: 3.32 MIL/uL — ABNORMAL LOW (ref 4.22–5.81)
RDW: 15.9 % — ABNORMAL HIGH (ref 11.5–15.5)
WBC Count: 5.7 10*3/uL (ref 4.0–10.5)
nRBC: 0 % (ref 0.0–0.2)

## 2021-09-20 MED ORDER — DEXAMETHASONE 4 MG PO TABS: 10.0000 mg | ORAL_TABLET | ORAL | 3 refills | Status: DC

## 2021-09-20 MED ORDER — LORAZEPAM 0.5 MG PO TABS: 0.5000 mg | ORAL_TABLET | Freq: Every evening | ORAL | 0 refills | Status: DC | PRN

## 2021-09-20 MED ORDER — DEXAMETHASONE 4 MG PO TABS
10.0000 mg | ORAL_TABLET | Freq: Once | ORAL | Status: AC
  Administered 2021-09-20: 10 mg via ORAL
  Filled 2021-09-20: qty 3

## 2021-09-20 MED ORDER — BORTEZOMIB CHEMO SQ INJECTION 3.5 MG (2.5MG/ML)
1.3000 mg/m2 | Freq: Once | INTRAMUSCULAR | Status: AC
  Administered 2021-09-20: 2.5 mg via SUBCUTANEOUS
  Filled 2021-09-20: qty 1

## 2021-09-20 MED ORDER — SODIUM CHLORIDE 0.9 % IV SOLN: Freq: Once | INTRAVENOUS | Status: AC

## 2021-09-20 MED ORDER — DARATUMUMAB-HYALURONIDASE-FIHJ 1800-30000 MG-UT/15ML ~~LOC~~ SOLN
1800.0000 mg | Freq: Once | SUBCUTANEOUS | Status: AC
  Administered 2021-09-20: 1800 mg via SUBCUTANEOUS
  Filled 2021-09-20: qty 15

## 2021-09-20 MED ORDER — ZOLEDRONIC ACID 4 MG/100ML IV SOLN
4.0000 mg | Freq: Once | INTRAVENOUS | Status: AC
  Administered 2021-09-20: 4 mg via INTRAVENOUS
  Filled 2021-09-20: qty 100

## 2021-09-20 MED ORDER — DIPHENHYDRAMINE HCL 25 MG PO CAPS: 50.0000 mg | ORAL_CAPSULE | Freq: Four times a day (QID) | ORAL | 0 refills | Status: DC | PRN

## 2021-09-20 MED ORDER — ACETAMINOPHEN 325 MG PO TABS
650.0000 mg | ORAL_TABLET | Freq: Once | ORAL | Status: AC
  Administered 2021-09-20: 650 mg via ORAL
  Filled 2021-09-20: qty 2

## 2021-09-20 MED ORDER — VALACYCLOVIR HCL 1 G PO TABS: ORAL_TABLET | ORAL | 3 refills | Status: DC

## 2021-09-20 MED ORDER — DIPHENHYDRAMINE HCL 25 MG PO CAPS
25.0000 mg | ORAL_CAPSULE | Freq: Once | ORAL | Status: AC
  Administered 2021-09-20: 25 mg via ORAL
  Filled 2021-09-20: qty 1

## 2021-09-20 NOTE — Progress Notes (Signed)
[place] OFFICE PROGRESS NOTE   Diagnosis: Multiple myeloma  INTERVAL HISTORY:   Andre [name] completed another treatment with Velcade and daratumumab on 08/22/2021.  No rash or neuropathy symptoms.  No new complaint.  No recent infection.  No tooth or jaw pain.  Objective:  Vital signs in last 24 hours:  Blood pressure 121/69, pulse 86, temperature 98.1 F (36.7 C), temperature source Oral, resp. rate 20, height 5' 9"  (1.753 m), weight 183 lb 3.2 oz (83.1 kg), SpO2 100 %.    HEENT: No thrush, healing ulcer/ecchymosis of the right buccal mucosa Resp: Lungs clear bilaterally Cardio: Regular rate and rhythm GI: No hepatosplenomegaly Vascular: No leg edema     Lab Results:  Lab Results  Component Value Date   WBC 5.7 09/20/2021   HGB 10.8 (L) 09/20/2021   HCT 32.9 (L) 09/20/2021   MCV 99.1 09/20/2021   PLT 113 (L) 09/20/2021   NEUTROABS 3.9 09/20/2021    CMP  Lab Results  Component Value Date   NA 137 08/22/2021   K 4.2 08/22/2021   CL 105 08/22/2021   CO2 23 08/22/2021   GLUCOSE 84 08/22/2021   BUN 31 (H) 08/22/2021   CREATININE 1.03 08/22/2021   CALCIUM 10.3 08/22/2021   PROT 6.6 08/22/2021   ALBUMIN 4.3 08/22/2021   AST 18 08/22/2021   ALT 12 08/22/2021   ALKPHOS 50 08/22/2021   BILITOT 0.4 08/22/2021   GFRNONAA >60 08/22/2021   GFRAA >60 06/15/2020    Medications: I have reviewed the patient's current medications.   Assessment/Plan:  Multiple myeloma- IgG lambda  bortezomib (subcutaneously), lenalidomide, and dexamethasone, with repeat bone marrow biopsy May of 2012 showing 10% plasmacytosis   (2) high-dose chemotherapy with BCNU and melphalan at [facility], followed by stem cell rescue July of 2012   (3) on zoledronic acid started December of 2012, initially monthly, currently given every 3 months, most recent dose  12/07/2015   (4) low-dose lenalidomide resumed April 2013, interrupted several times.  Resumed again on 02/19/2013, ata  dose of 5 mg daily, 21 days on and 7 days off, later further reduced to 7 days on, 7 days off   (5) CNS symptoms and abnormal brain MRI extensively evaluated by neurology with no definitive diagnosis established   (6) rising M spike noted June 2014 but did not meet criteria for carfilzomib study   (7) on lenalidomide 25 mg daily, 14 days on, 7 days off, starting 04/18/2013, interrupted December 2014 because of rash;             (a) resumed January 2015 at 10 mg/ day at 21 days on/ 7 days off             (Andre [name]) starting 08/18/2014 decreased to 10 mg/ day 14 days on, 7 days off because of cytopenias             (c) as of February 2016 was on 5 mg daily 7 days on 7 days off             (d) lenalidomide discontinued December 2016 with evidence of disease progression   (8) transient global amnesia 05/29/2015, resolved without intervention   (9) starting PVD 10/25/2015 w ASA 325 thromboprophylaxis, valacyclovir prophylaxis, last dose 12/17/2015             (a) pomalidomide 4 mg/d days 1-14             (Andre [name]) bortezomib sQ days 2,5,9,12 of each 21 day  cycle             (c) dexamethasome 20 mg two days a week             (d) dexamethasone bortezomib and pomalidomide discontinued late December 2018 with poor tolerance   (10) metapneumovirus pneumonia April 2017             (a) completing course of steroids and week of bactrim mid April 2017   (11) status post second autologous transplant at [facility] 02/04/2016(preparatory regimen melphalan 200 mg/m)             (a) received twelve-month vaccinations 03/14/2017 (DPT, Haemophilus, Pneumovax 13, polio)             (Andre [name]) 14 months injections 05/04/2016 include DTaP, Hib conjugate, HepB energex Andre [name] 20 mcg/ml, Prevnar 13             (c) 24 month vaccines due at [facility] June 2019   (12) maintenance therapy started November 2017, consisting of             (a) bortezomib 1.3 mg/M2 every 14 days, first dose 07/27/2016             (Andre [name]) pomalidomide 1 mg days 1-21 Q28 days,  started 07/19/2016             (c) zolendronate monthly started 07/27/2016 (previously Q 3 months) however patient unable to tolerate, and changed back to q3 months in April, 2018             (d) Bortezomib changed to monthly as of June 2018 because of tolerance issues, however discontinued after 09/14/2017 dose because of a rise in his M spike             (e) pomalidomide held after 10/18/2017 in preparation for possible study at [place] (venetoclax)--never resumed             (f) with numbers actually improved off treatment, resumed every 2-week bortezomib 12/25/2017             (g) changed to every 3-week bortezomib as of 03/17/2019             (h) briefly on weekly treatments times 22 October 2020 due to increase in M spike             (I) maintenance therapy discontinued with evidence of progression   (13) bortezomib/daratumumab/dexamethasone started 12/20/2020.             (a) Decadron dose dropped to 10 mg day of and day following treatment starting May 2022             (Andre [name]) day 8 bortezomib omitted beginning with the June cycle per patient preference             (C) treatment changed to monthly daratumumab/Velcade/Decadron beginning 06/27/2021-patient decision     Disposition: Andre [name] appears stable.  The lambda free light chains were higher last month.  We will follow-up on the light chains and serum M spike today.  The plan is to continue monthly Velcade and daratumumab.  He will receive Zometa today.  He will return for an office visit and treatment in 1 month.  He received Evusheld in August.  Betsy Coder, MD  09/20/2021  10:01 AM

## 2021-09-20 NOTE — Progress Notes (Signed)
Patient presents for treatment. RN assessment completed along with the following:  Labs/vitals reviewed - Yes, and within treatment parameters.   Weight within 10% of previous measurement - Yes Oncology Treatment Attestation completed for current therapy- Yes, on date 06/27/21 Informed consent completed and reflects current therapy/intent - Yes, on date 03/23/21 darzalex, 12/20/20 velcade.              Provider progress note reviewed - Yes, today's provider note was reviewed. Treatment/Antibody/Supportive plan reviewed - Yes, and there are no adjustments needed for today's treatment. S&H and other orders reviewed - Yes, and there are no additional orders identified. Previous treatment date reviewed - Yes, and the appropriate amount of time has elapsed between treatments.   Patient to proceed with treatment.

## 2021-09-20 NOTE — Patient Instructions (Signed)
[facility]   Discharge Instructions: Thank you for choosing [name] to provide your oncology and hematology care.   If you have a lab appointment with the [facility], please go directly to the [facility] and check in at the registration area.   Wear comfortable clothing and clothing appropriate for easy access to any Portacath or PICC line.   We strive to give you quality time with your provider. You may need to reschedule your appointment if you arrive late (15 or more minutes).  Arriving late affects you and other patients whose appointments are after yours.  Also, if you miss three or more appointments without notifying the office, you may be dismissed from the clinic at the providers discretion.      For prescription refill requests, have your pharmacy contact our office and allow 72 hours for refills to be completed.    Today you received the following chemotherapy and/or immunotherapy agents Bortezomib (VELCADE) & Daratumumab-hyaluronidase-fihj (DARZALEX-FASPRO).      To help prevent nausea and vomiting after your treatment, we encourage you to take your nausea medication as directed.  BELOW ARE SYMPTOMS THAT SHOULD BE REPORTED IMMEDIATELY: *FEVER GREATER THAN 100.4 F (38 C) OR HIGHER *CHILLS OR SWEATING *NAUSEA AND VOMITING THAT IS NOT CONTROLLED WITH YOUR NAUSEA MEDICATION *UNUSUAL SHORTNESS OF BREATH *UNUSUAL BRUISING OR BLEEDING *URINARY PROBLEMS (pain or burning when urinating, or frequent urination) *BOWEL PROBLEMS (unusual diarrhea, constipation, pain near the anus) TENDERNESS IN MOUTH AND THROAT WITH OR WITHOUT PRESENCE OF ULCERS (sore throat, sores in mouth, or a toothache) UNUSUAL RASH, SWELLING OR PAIN  UNUSUAL VAGINAL DISCHARGE OR ITCHING   Items with * indicate a potential emergency and should be followed up as soon as possible or go to the Emergency Department if any problems should occur.  Please show the  CHEMOTHERAPY ALERT CARD or IMMUNOTHERAPY ALERT CARD at check-in to the Emergency Department and triage nurse.  Should you have questions after your visit or need to cancel or reschedule your appointment, please contact [facility]  Dept: [phone]  and follow the prompts.  Office hours are 8:00 a.m. to 4:30 p.m. Monday - Friday. Please note that voicemails left after 4:00 p.m. may not be returned until the following business day.  We are closed weekends and major holidays. You have access to a nurse at all times for urgent questions. Please call the main number to the clinic Dept: [phone] and follow the prompts.   For any non-urgent questions, you may also contact your provider using MyChart. We now offer e-Visits for anyone 36 and older to request care online for non-urgent symptoms. For details visit [email or web address].   Also download the MyChart app! Go to the app store, search "MyChart", open the app, select [facility], and log in with your MyChart username and password.  Due to Covid, a mask is required upon entering the hospital/clinic. If you do not have a mask, one will be given to you upon arrival. For doctor visits, patients may have 1 support person aged 3 or older with them. For treatment visits, patients cannot have anyone with them due to current Covid guidelines and our immunocompromised population.   Bortezomib injection What is this medication? BORTEZOMIB (bor TEZ oh mib) targets proteins in cancer cells and stops the cancer cells from growing. It treats multiple myeloma and mantle cell lymphoma. This medicine may be used for other purposes; ask your health care provider  or pharmacist if you have questions. COMMON BRAND NAME(S): Velcade What should I tell my care team before I take this medication? They need to know if you have any of these conditions: dehydration diabetes (high blood sugar) heart disease liver disease tingling of the  fingers or toes or other nerve disorder an unusual or allergic reaction to bortezomib, mannitol, boron, other medicines, foods, dyes, or preservatives pregnant or trying to get pregnant breast-feeding How should I use this medication? This medicine is injected into a vein or under the skin. It is given by a health care provider in a hospital or clinic setting. Talk to your health care provider about the use of this medicine in children. Special care may be needed. Overdosage: If you think you have taken too much of this medicine contact a poison control center or emergency room at once. NOTE: This medicine is only for you. Do not share this medicine with others. What if I miss a dose? Keep appointments for follow-up doses. It is important not to miss your dose. Call your health care provider if you are unable to keep an appointment. What may interact with this medication? This medicine may interact with the following medications: ketoconazole rifampin This list may not describe all possible interactions. Give your health care provider a list of all the medicines, herbs, non-prescription drugs, or dietary supplements you use. Also tell them if you smoke, drink alcohol, or use illegal drugs. Some items may interact with your medicine. What should I watch for while using this medication? Your condition will be monitored carefully while you are receiving this medicine. You may need blood work done while you are taking this medicine. You may get drowsy or dizzy. Do not drive, use machinery, or do anything that needs mental alertness until you know how this medicine affects you. Do not stand up or sit up quickly, especially if you are an older patient. This reduces the risk of dizzy or fainting spells This medicine may increase your risk of getting an infection. Call your health care provider for advice if you get a fever, chills, sore throat, or other symptoms of a cold or flu. Do not treat yourself.  Try to avoid being around people who are sick. Check with your health care provider if you have severe diarrhea, nausea, and vomiting, or if you sweat a lot. The loss of too much body fluid may make it dangerous for you to take this medicine. Do not become pregnant while taking this medicine or for 7 months after stopping it. Women should inform their health care provider if they wish to become pregnant or think they might be pregnant. Men should not father a child while taking this medicine and for 4 months after stopping it. There is a potential for serious harm to an unborn child. Talk to your health care provider for more information. Do not breast-feed an infant while taking this medicine or for 2 months after stopping it. This medicine may make it more difficult to get pregnant or father a child. Talk to your health care provider if you are concerned about your fertility. What side effects may I notice from receiving this medication? Side effects that you should report to your doctor or health care professional as soon as possible: allergic reactions (skin rash; itching or hives; swelling of the face, lips, or tongue) bleeding (bloody or black, tarry stools; red or dark brown urine; spitting up blood or brown material that looks like coffee grounds; red  spots on the skin; unusual bruising or bleeding from the eye, gums, or nose) blurred vision or changes in vision confusion constipation headache heart failure (trouble breathing; fast, irregular heartbeat; sudden weight gain; swelling of the ankles, feet, hands) infection (fever, chills, cough, sore throat, pain or trouble passing urine) lack or loss of appetite liver injury (dark yellow or brown urine; general ill feeling or flu-like symptoms; loss of appetite, right upper belly pain; yellowing of the eyes or skin) low blood pressure (dizziness; feeling faint or lightheaded, falls; unusually weak or tired) muscle cramps pain, redness, or  irritation at site where injected pain, tingling, numbness in the hands or feet seizures trouble breathing unusual bruising or bleeding Side effects that usually do not require medical attention (report to your doctor or health care professional if they continue or are bothersome): diarrhea nausea stomach pain trouble sleeping vomiting This list may not describe all possible side effects. Call your doctor for medical advice about side effects. You may report side effects to FDA at 1-800-FDA-1088. Where should I keep my medication? This medicine is given in a hospital or clinic. It will not be stored at home. NOTE: This sheet is a summary. It may not cover all possible information. If you have questions about this medicine, talk to your doctor, pharmacist, or health care provider.  2022 Elsevier/Gold Standard (2020-08-26 00:00:00)  Daratumumab; Hyaluronidase Injection What is this medication? DARATUMUMAB; HYALURONIDASE (dar a toom ue mab / hye al ur ON i dase) is a monoclonal antibody. Hyaluronidase is used to improve the effects of daratumumab. It treats certain types of cancer. Some of the cancers treated are multiple myeloma and light-chain amyloidosis. This medicine may be used for other purposes; ask your health care provider or pharmacist if you have questions. COMMON BRAND NAME(S): DARZALEX FASPRO What should I tell my care team before I take this medication? They need to know if you have any of these conditions: heart disease infection especially a viral infection such as chickenpox, cold sores, herpes, or hepatitis Shia lung or breathing disease an unusual or allergic reaction to daratumumab, hyaluronidase, other medicines, foods, dyes, or preservatives pregnant or trying to get pregnant breast-feeding How should I use this medication? This medicine is for injection under the skin. It is given by a health care professional in a hospital or clinic setting. Talk to your  pediatrician regarding the use of this medicine in children. Special care may be needed. Overdosage: If you think you have taken too much of this medicine contact a poison control center or emergency room at once. NOTE: This medicine is only for you. Do not share this medicine with others. What if I miss a dose? Keep appointments for follow-up doses as directed. It is important not to miss your dose. Call your doctor or health care professional if you are unable to keep an appointment. What may interact with this medication? Interactions have not been studied. This list may not describe all possible interactions. Give your health care provider a list of all the medicines, herbs, non-prescription drugs, or dietary supplements you use. Also tell them if you smoke, drink alcohol, or use illegal drugs. Some items may interact with your medicine. What should I watch for while using this medication? Your condition will be monitored carefully while you are receiving this medicine. This medicine can cause serious allergic reactions. To reduce your risk, your health care provider may give you other medicine to take before receiving this one. Be sure to follow  the directions from your health care provider. This medicine can affect the results of blood tests to match your blood type. These changes can last for up to 6 months after the final dose. Your healthcare provider will do blood tests to match your blood type before you start treatment. Tell all of your healthcare providers that you are being treated with this medicine before receiving a blood transfusion. This medicine can affect the results of some tests used to determine treatment response; extra tests may be needed to evaluate response. Do not become pregnant while taking this medicine or for 3 months after stopping it. Women should inform their health care provider if they wish to become pregnant or think they might be pregnant. There is a potential for  serious side effects to an unborn child. Talk to your health care provider for more information. Do not breast-feed an infant while taking this medicine. What side effects may I notice from receiving this medication? Side effects that you should report to your care team as soon as possible: Allergic reactions--skin rash, itching or hives, swelling of the face, lips, or tongue Blood clot--chest pain, shortness of breath, pain, swelling or warmth in the leg Blurred vision Fast, irregular heartbeat Infection--fever, chills, cough, sore throat, pain or trouble passing urine Injection reactions--dizziness, fast heartbeat, feeling faint or lightheaded, falls, headache, increase in blood pressure, nausea, vomiting, or wheezing or trouble breathing with loud or whistling sounds Low red blood cell counts--trouble breathing, feeling faint, lightheaded or falls, unusually weak or tired Unusual bleeding or bruising Side effects that usually do not require medical attention (report these to your care team if they continue or are bothersome): Back pain Constipation Diarrhea Pain, tingling, numbness in the hands or feet Pain, redness, or irritation at site where injected Muscle cramp or pain Swelling of the ankles, feet, hands Tiredness Trouble sleeping This list may not describe all possible side effects. Call your doctor for medical advice about side effects. You may report side effects to FDA at 1-800-FDA-1088. Where should I keep my medication? This drug is given in a hospital or clinic and will not be stored at home. NOTE: This sheet is a summary. It may not cover all possible information. If you have questions about this medicine, talk to your doctor, pharmacist, or health care provider.  2022 Elsevier/Gold Standard (2021-05-24 00:00:00)  Zoledronic Acid Injection (Hypercalcemia, Oncology) What is this medication? ZOLEDRONIC ACID (ZOE le dron ik AS id) slows calcium loss from bones. It high  calcium levels in the blood from some kinds of cancer. It may be used in other people at risk for bone loss. This medicine may be used for other purposes; ask your health care provider or pharmacist if you have questions. COMMON BRAND NAME(S): Zometa What should I tell my care team before I take this medication? They need to know if you have any of these conditions: cancer dehydration dental disease kidney disease liver disease low levels of calcium in the blood lung or breathing disease (asthma) receiving steroids like dexamethasone or prednisone an unusual or allergic reaction to zoledronic acid, other medicines, foods, dyes, or preservatives pregnant or trying to get pregnant breast-feeding How should I use this medication? This drug is injected into a vein. It is given by a health care provider in a hospital or clinic setting. Talk to your health care provider about the use of this drug in children. Special care may be needed. Overdosage: If you think you have taken too much  of this medicine contact a poison control center or emergency room at once. NOTE: This medicine is only for you. Do not share this medicine with others. What if I miss a dose? Keep appointments for follow-up doses. It is important not to miss your dose. Call your health care provider if you are unable to keep an appointment. What may interact with this medication? certain antibiotics given by injection NSAIDs, medicines for pain and inflammation, like ibuprofen or naproxen some diuretics like bumetanide, furosemide teriparatide thalidomide This list may not describe all possible interactions. Give your health care provider a list of all the medicines, herbs, non-prescription drugs, or dietary supplements you use. Also tell them if you smoke, drink alcohol, or use illegal drugs. Some items may interact with your medicine. What should I watch for while using this medication? Visit your health care provider for  regular checks on your progress. It may be some time before you see the benefit from this drug. Some people who take this drug have severe bone, joint, or muscle pain. This drug may also increase your risk for jaw problems or a broken thigh bone. Tell your health care provider right away if you have severe pain in your jaw, bones, joints, or muscles. Tell you health care provider if you have any pain that does not go away or that gets worse. Tell your dentist and dental surgeon that you are taking this drug. You should not have major dental surgery while on this drug. See your dentist to have a dental exam and fix any dental problems before starting this drug. Take good care of your teeth while on this drug. Make sure you see your dentist for regular follow-up appointments. You should make sure you get enough calcium and vitamin D while you are taking this drug. Discuss the foods you eat and the vitamins you take with your health care provider. Check with your health care provider if you have severe diarrhea, nausea, and vomiting, or if you sweat a lot. The loss of too much body fluid may make it dangerous for you to take this drug. You may need blood work done while you are taking this drug. Do not become pregnant while taking this drug. Women should inform their health care provider if they wish to become pregnant or think they might be pregnant. There is potential for serious harm to an unborn child. Talk to your health care provider for more information. What side effects may I notice from receiving this medication? Side effects that you should report to your doctor or health care provider as soon as possible: allergic reactions (skin rash, itching or hives; swelling of the face, lips, or tongue) bone pain infection (fever, chills, cough, sore throat, pain or trouble passing urine) jaw pain, especially after dental work joint pain kidney injury (trouble passing urine or change in the amount of  urine) low blood pressure (dizziness; feeling faint or lightheaded, falls; unusually weak or tired) low calcium levels (fast heartbeat; muscle cramps or pain; pain, tingling, or numbness in the hands or feet; seizures) low magnesium levels (fast, irregular heartbeat; muscle cramp or pain; muscle weakness; tremors; seizures) low red blood cell counts (trouble breathing; feeling faint; lightheaded, falls; unusually weak or tired) muscle pain redness, blistering, peeling, or loosening of the skin, including inside the mouth severe diarrhea swelling of the ankles, feet, hands trouble breathing Side effects that usually do not require medical attention (report to your doctor or health care provider if they continue  or are bothersome): anxious constipation coughing depressed mood eye irritation, itching, or pain fever general ill feeling or flu-like symptoms nausea pain, redness, or irritation at site where injected trouble sleeping This list may not describe all possible side effects. Call your doctor for medical advice about side effects. You may report side effects to FDA at 1-800-FDA-1088. Where should I keep my medication? This drug is given in a hospital or clinic. It will not be stored at home. NOTE: This sheet is a summary. It may not cover all possible information. If you have questions about this medicine, talk to your doctor, pharmacist, or health care provider.  2022 Elsevier/Gold Standard (2021-05-24 00:00:00)

## 2021-09-21 LAB — PROTEIN ELECTROPHORESIS, SERUM
A/G Ratio: 1.2 (ref 0.7–1.7)
Albumin ELP: 3.6 g/dL (ref 2.9–4.4)
Alpha-1-Globulin: 0.2 g/dL (ref 0.0–0.4)
Alpha-2-Globulin: 0.7 g/dL (ref 0.4–1.0)
Beta Globulin: 1 g/dL (ref 0.7–1.3)
Gamma Globulin: 0.9 g/dL (ref 0.4–1.8)
Globulin, Total: 2.9 g/dL (ref 2.2–3.9)
M-Spike, %: 0.7 g/dL — ABNORMAL HIGH
Total Protein ELP: 6.5 g/dL (ref 6.0–8.5)

## 2021-09-21 LAB — KAPPA/LAMBDA LIGHT CHAINS
Kappa free light chain: 17.2 mg/L (ref 3.3–19.4)
Kappa, lambda light chain ratio: 0.2 — ABNORMAL LOW (ref 0.26–1.65)
Lambda free light chains: 84.8 mg/L — ABNORMAL HIGH (ref 5.7–26.3)

## 2021-09-22 ENCOUNTER — Telehealth: Payer: Self-pay

## 2021-09-22 NOTE — Telephone Encounter (Signed)
TC from Pt inquiring about  M-spike results of 0.7 Informed Pt Dr Benay Spice is aware of results and will discuss changing treatment at his upcoming visit. Pt verbalized understanding. No further problems or concerns noted.

## 2021-10-16 ENCOUNTER — Other Ambulatory Visit: Payer: Self-pay | Admitting: Oncology

## 2021-10-17 ENCOUNTER — Inpatient Hospital Stay: Payer: Medicare Other

## 2021-10-17 ENCOUNTER — Inpatient Hospital Stay (HOSPITAL_BASED_OUTPATIENT_CLINIC_OR_DEPARTMENT_OTHER): Payer: Medicare Other | Admitting: Oncology

## 2021-10-17 ENCOUNTER — Other Ambulatory Visit: Payer: Self-pay

## 2021-10-17 VITALS — BP 126/68 | HR 79 | Temp 98.1°F | Resp 19 | Ht 69.0 in | Wt 178.2 lb

## 2021-10-17 DIAGNOSIS — G620 Drug-induced polyneuropathy: Secondary | ICD-10-CM

## 2021-10-17 DIAGNOSIS — C9000 Multiple myeloma not having achieved remission: Secondary | ICD-10-CM

## 2021-10-17 DIAGNOSIS — Z9481 Bone marrow transplant status: Secondary | ICD-10-CM

## 2021-10-17 DIAGNOSIS — Z5112 Encounter for antineoplastic immunotherapy: Secondary | ICD-10-CM | POA: Diagnosis not present

## 2021-10-17 DIAGNOSIS — T451X5A Adverse effect of antineoplastic and immunosuppressive drugs, initial encounter: Secondary | ICD-10-CM

## 2021-10-17 LAB — CMP (CANCER CENTER ONLY)
ALT: 11 U/L (ref 0–44)
AST: 19 U/L (ref 15–41)
Albumin: 4.2 g/dL (ref 3.5–5.0)
Alkaline Phosphatase: 48 U/L (ref 38–126)
Anion gap: 9 (ref 5–15)
BUN: 29 mg/dL — ABNORMAL HIGH (ref 8–23)
CO2: 24 mmol/L (ref 22–32)
Calcium: 10 mg/dL (ref 8.9–10.3)
Chloride: 102 mmol/L (ref 98–111)
Creatinine: 1.15 mg/dL (ref 0.61–1.24)
GFR, Estimated: >60 mL/min (ref >60)
Glucose, Bld: 91 mg/dL (ref 70–99)
Potassium: 4.5 mmol/L (ref 3.5–5.1)
Sodium: 135 mmol/L (ref 135–145)
Total Bilirubin: 0.4 mg/dL (ref 0.3–1.2)
Total Protein: 7.3 g/dL (ref 6.5–8.1)

## 2021-10-17 LAB — CBC WITH DIFFERENTIAL (CANCER CENTER ONLY)
Abs Immature Granulocytes: 0.01 10*3/uL (ref 0.00–0.07)
Basophils Absolute: 0 10*3/uL (ref 0.0–0.1)
Basophils Relative: 0 %
Eosinophils Absolute: 0.2 10*3/uL (ref 0.0–0.5)
Eosinophils Relative: 4 %
HCT: 32.8 % — ABNORMAL LOW (ref 39.0–52.0)
Hemoglobin: 10.8 g/dL — ABNORMAL LOW (ref 13.0–17.0)
Immature Granulocytes: 0 %
Lymphocytes Relative: 16 %
Lymphs Abs: 0.8 10*3/uL (ref 0.7–4.0)
MCH: 32.1 pg (ref 26.0–34.0)
MCHC: 32.9 g/dL (ref 30.0–36.0)
MCV: 97.6 fL (ref 80.0–100.0)
Monocytes Absolute: 0.9 10*3/uL (ref 0.1–1.0)
Monocytes Relative: 17 %
Neutro Abs: 3.3 10*3/uL (ref 1.7–7.7)
Neutrophils Relative %: 63 %
Platelet Count: 145 10*3/uL — ABNORMAL LOW (ref 150–400)
RBC: 3.36 MIL/uL — ABNORMAL LOW (ref 4.22–5.81)
RDW: 16.2 % — ABNORMAL HIGH (ref 11.5–15.5)
WBC Count: 5.2 10*3/uL (ref 4.0–10.5)
nRBC: 0 % (ref 0.0–0.2)

## 2021-10-17 MED ORDER — BORTEZOMIB CHEMO SQ INJECTION 3.5 MG (2.5MG/ML)
1.3000 mg/m2 | Freq: Once | INTRAMUSCULAR | Status: AC
  Administered 2021-10-17: 2.5 mg via SUBCUTANEOUS
  Filled 2021-10-17: qty 1

## 2021-10-17 MED ORDER — DARATUMUMAB-HYALURONIDASE-FIHJ 1800-30000 MG-UT/15ML ~~LOC~~ SOLN
1800.0000 mg | Freq: Once | SUBCUTANEOUS | Status: AC
  Administered 2021-10-17: 1800 mg via SUBCUTANEOUS
  Filled 2021-10-17: qty 15

## 2021-10-17 MED ORDER — DIPHENHYDRAMINE HCL 25 MG PO CAPS
25.0000 mg | ORAL_CAPSULE | Freq: Once | ORAL | Status: AC
  Administered 2021-10-17: 25 mg via ORAL
  Filled 2021-10-17: qty 1

## 2021-10-17 MED ORDER — ACETAMINOPHEN 325 MG PO TABS
650.0000 mg | ORAL_TABLET | Freq: Once | ORAL | Status: AC
  Administered 2021-10-17: 650 mg via ORAL
  Filled 2021-10-17: qty 2

## 2021-10-17 MED ORDER — DEXAMETHASONE 4 MG PO TABS
10.0000 mg | ORAL_TABLET | Freq: Once | ORAL | Status: AC
  Administered 2021-10-17: 10 mg via ORAL
  Filled 2021-10-17: qty 3

## 2021-10-17 NOTE — Progress Notes (Signed)
[facility] OFFICE PROGRESS NOTE   Diagnosis: Multiple myeloma  INTERVAL HISTORY:   Andre [name] returns as scheduled.  He completed another treatment daratumumab and Velcade on 09/20/2021.  No new complaint.  He has malaise.  He continues exercise.  He is here today with [name].  Objective:  Vital signs in last 24 hours:  Blood pressure 126/68, pulse 79, temperature 98.1 F (36.7 C), temperature source Oral, resp. rate 19, height 5' 9"  (1.753 m), weight 178 lb 3.2 oz (80.8 kg), SpO2 99 %.    HEENT: No thrush Resp: Lungs clear bilaterally Cardio: Regular rate and rhythm GI: No hepatosplenomegaly Vascular: No leg edema   Lab Results:  Lab Results  Component Value Date   WBC 5.2 10/17/2021   HGB 10.8 (L) 10/17/2021   HCT 32.8 (L) 10/17/2021   MCV 97.6 10/17/2021   PLT 145 (L) 10/17/2021   NEUTROABS 3.3 10/17/2021    CMP  Lab Results  Component Value Date   NA 133 (L) 09/20/2021   K 4.3 09/20/2021   CL 103 09/20/2021   CO2 19 (L) 09/20/2021   GLUCOSE 92 09/20/2021   BUN 32 (H) 09/20/2021   CREATININE 1.08 09/20/2021   CALCIUM 9.2 09/20/2021   PROT 6.7 09/20/2021   ALBUMIN 4.0 09/20/2021   AST 19 09/20/2021   ALT 13 09/20/2021   ALKPHOS 49 09/20/2021   BILITOT 0.3 09/20/2021   GFRNONAA >60 09/20/2021   GFRAA >60 06/15/2020    Medications: I have reviewed the [name]'s current medications.   Assessment/Plan: Multiple myeloma- IgG lambda  bortezomib (subcutaneously), lenalidomide, and dexamethasone, with repeat bone marrow biopsy May of 2012 showing 10% plasmacytosis   (2) high-dose chemotherapy with BCNU and melphalan at [facility], followed by stem cell rescue July of 2012   (3) on zoledronic acid started December of 2012, initially monthly, currently given every 3 months, most recent dose  12/07/2015   (4) low-dose lenalidomide resumed April 2013, interrupted several times.  Resumed again on 02/19/2013, ata dose of 5 mg daily, 21 days on and 7  days off, later further reduced to 7 days on, 7 days off   (5) CNS symptoms and abnormal brain MRI extensively evaluated by neurology with no definitive diagnosis established   (6) rising M spike noted June 2014 but did not meet criteria for carfilzomib study   (7) on lenalidomide 25 mg daily, 14 days on, 7 days off, starting 04/18/2013, interrupted December 2014 because of rash;             (a) resumed January 2015 at 10 mg/ day at 21 days on/ 7 days off             ([place]) starting 08/18/2014 decreased to 10 mg/ day 14 days on, 7 days off because of cytopenias             (c) as of February 2016 was on 5 mg daily 7 days on 7 days off             (d) lenalidomide discontinued December 2016 with evidence of disease progression   (8) transient global amnesia 05/29/2015, resolved without intervention   (9) starting PVD 10/25/2015 w ASA 325 thromboprophylaxis, valacyclovir prophylaxis, last dose 12/17/2015             (a) pomalidomide 4 mg/d days 1-14             ([place]) bortezomib sQ days 2,5,9,12 of each 21 day cycle             (  c) dexamethasome 20 mg two days a week             (d) dexamethasone bortezomib and pomalidomide discontinued late December 2018 with poor tolerance   (10) metapneumovirus pneumonia April 2017             (a) completing course of steroids and week of bactrim mid April 2017   (11) status post second autologous transplant at [facility] 02/04/2016(preparatory regimen melphalan 200 mg/m)             (a) received twelve-month vaccinations 03/14/2017 (DPT, Haemophilus, Pneumovax 13, polio)             ([place]) 14 months injections 05/04/2016 include DTaP, Hib conjugate, HepB energex Rahmir 20 mcg/ml, Prevnar 13             (c) 24 month vaccines due at [facility] June 2019   (12) maintenance therapy started November 2017, consisting of             (a) bortezomib 1.3 mg/M2 every 14 days, first dose 07/27/2016             ([place]) pomalidomide 1 mg days 1-21 Q28 days, started 07/19/2016             (c)  zolendronate monthly started 07/27/2016 (previously Q 3 months) however [name] unable to tolerate, and changed back to q3 months in April, 2018             (d) Bortezomib changed to monthly as of June 2018 because of tolerance issues, however discontinued after 09/14/2017 dose because of a rise in Andre M spike             (e) pomalidomide held after 10/18/2017 in preparation for possible study at [place] (venetoclax)--never resumed             (f) with numbers actually improved off treatment, resumed every 2-week bortezomib 12/25/2017             (g) changed to every 3-week bortezomib as of 03/17/2019             (h) briefly on weekly treatments times 22 October 2020 due to increase in M spike             (I) maintenance therapy discontinued with evidence of progression   (13) bortezomib/daratumumab/dexamethasone started 12/20/2020.             (a) Decadron dose dropped to 10 mg day of and day following treatment starting May 2022             ([place]) day 8 bortezomib omitted beginning with the June cycle per [name] preference             (C) treatment changed to monthly daratumumab/Velcade/Decadron beginning 06/27/2021-[name] decision      Disposition: Andre [name] appears unchanged.  He continues to tolerate daratumumab and Velcade well.  The serum M spike and light chains have been higher over the past few months.  We decided to continue the current treatment and follow-up on the myeloma panel from today.  He will schedule appointment with Dr. Alvie Heidelberg to discuss neck steps.  We had a preliminary discussion regarding increasing Velcade/Decadron to a more frequent schedule, adding cyclophosphamide, and by specific antibody therapy.  He will return for an office visit in 1 month.  Betsy Coder, MD  10/17/2021  10:01 AM

## 2021-10-17 NOTE — Progress Notes (Signed)
Patient seen by Dr. Sherrill today ? ?Vitals are within treatment parameters. ? ?Labs reviewed by Dr. Sherrill and are within treatment parameters. ? ?Per physician team, patient is ready for treatment and there are NO modifications to the treatment plan.  ?

## 2021-10-17 NOTE — Patient Instructions (Signed)
[facility]   °Discharge Instructions: °Thank you for choosing [facility] to provide your oncology and hematology care.  ° °If you have a lab appointment with the Cancer Center, please go directly to the Cancer Center and check in at the registration area. °  °Wear comfortable clothing and clothing appropriate for easy access to any Portacath or PICC line.  ° °We strive to give you quality time with your provider. You may need to reschedule your appointment if you arrive late (15 or more minutes).  Arriving late affects you and other patients whose appointments are after yours.  Also, if you miss three or more appointments without notifying the office, you may be dismissed from the clinic at the provider’s discretion.    °  °For prescription refill requests, have your pharmacy contact our office and allow 72 hours for refills to be completed.   ° °Today you received the following chemotherapy and/or immunotherapy agents Velcade, Darzalex-Faspro    °  °To help prevent nausea and vomiting after your treatment, we encourage you to take your nausea medication as directed. ° °BELOW ARE SYMPTOMS THAT SHOULD BE REPORTED IMMEDIATELY: °*FEVER GREATER THAN 100.4 F (38 °C) OR HIGHER °*CHILLS OR SWEATING °*NAUSEA AND VOMITING THAT IS NOT CONTROLLED WITH YOUR NAUSEA MEDICATION °*UNUSUAL SHORTNESS OF BREATH °*UNUSUAL BRUISING OR BLEEDING °*URINARY PROBLEMS (pain or burning when urinating, or frequent urination) °*BOWEL PROBLEMS (unusual diarrhea, constipation, pain near the anus) °TENDERNESS IN MOUTH AND THROAT WITH OR WITHOUT PRESENCE OF ULCERS (sore throat, sores in mouth, or a toothache) °UNUSUAL RASH, SWELLING OR PAIN  °UNUSUAL VAGINAL DISCHARGE OR ITCHING  ° °Items with * indicate a potential emergency and should be followed up as soon as possible or go to the Emergency Department if any problems should occur. ° °Please show the CHEMOTHERAPY ALERT CARD or IMMUNOTHERAPY ALERT CARD at  check-in to the Emergency Department and triage nurse. ° °Should you have questions after your visit or need to cancel or reschedule your appointment, please contact [facility]  Dept: [phone]  and follow the prompts.  Office hours are 8:00 a.m. to 4:30 p.m. Monday - Friday. Please note that voicemails left after 4:00 p.m. may not be returned until the following business day.  We are closed weekends and major holidays. You have access to a nurse at all times for urgent questions. Please call the main number to the clinic Dept: [phone] and follow the prompts. ° ° °For any non-urgent questions, you may also contact your provider using MyChart. We now offer e-Visits for anyone 18 and older to request care online for non-urgent symptoms. For details visit [email or web address]. °  °Also download the MyChart app! Go to the app store, search "MyChart", open the app, select [facility], and log in with your MyChart username and password. ° °Due to Covid, a mask is required upon entering the hospital/clinic. If you do not have a mask, one will be given to you upon arrival. For doctor visits, patients may have 1 support person aged 18 or older with them. For treatment visits, patients cannot have anyone with them due to current Covid guidelines and our immunocompromised population.  ° °Bortezomib injection °What is this medication? °BORTEZOMIB (bor TEZ oh mib) targets proteins in cancer cells and stops the cancer cells from growing. It treats multiple myeloma and mantle cell lymphoma. °This medicine may be used for other purposes; ask your health care provider or pharmacist if   you have questions. °COMMON BRAND NAME(S): Velcade °What should I tell my care team before I take this medication? °They need to know if you have any of these conditions: °dehydration °diabetes (high blood sugar) °heart disease °liver disease °tingling of the fingers or toes or other nerve disorder °an unusual or  allergic reaction to bortezomib, mannitol, boron, other medicines, foods, dyes, or preservatives °pregnant or trying to get pregnant °breast-feeding °How should I use this medication? °This medicine is injected into a vein or under the skin. It is given by a health care provider in a hospital or clinic setting. °Talk to your health care provider about the use of this medicine in children. Special care may be needed. °Overdosage: If you think you have taken too much of this medicine contact a poison control center or emergency room at once. °NOTE: This medicine is only for you. Do not share this medicine with others. °What if I miss a dose? °Keep appointments for follow-up doses. It is important not to miss your dose. Call your health care provider if you are unable to keep an appointment. °What may interact with this medication? °This medicine may interact with the following medications: °ketoconazole °rifampin °This list may not describe all possible interactions. Give your health care provider a list of all the medicines, herbs, non-prescription drugs, or dietary supplements you use. Also tell them if you smoke, drink alcohol, or use illegal drugs. Some items may interact with your medicine. °What should I watch for while using this medication? °Your condition will be monitored carefully while you are receiving this medicine. °You may need blood work done while you are taking this medicine. °You may get drowsy or dizzy. Do not drive, use machinery, or do anything that needs mental alertness until you know how this medicine affects you. Do not stand up or sit up quickly, especially if you are an older patient. This reduces the risk of dizzy or fainting spells °This medicine may increase your risk of getting an infection. Call your health care provider for advice if you get a fever, chills, sore throat, or other symptoms of a cold or flu. Do not treat yourself. Try to avoid being around people who are sick. °Check  with your health care provider if you have severe diarrhea, nausea, and vomiting, or if you sweat a lot. The loss of too much body fluid may make it dangerous for you to take this medicine. °Do not become pregnant while taking this medicine or for 7 months after stopping it. Women should inform their health care provider if they wish to become pregnant or think they might be pregnant. Men should not father a child while taking this medicine and for 4 months after stopping it. There is a potential for serious harm to an unborn child. Talk to your health care provider for more information. Do not breast-feed an infant while taking this medicine or for 2 months after stopping it. °This medicine may make it more difficult to get pregnant or father a child. Talk to your health care provider if you are concerned about your fertility. °What side effects may I notice from receiving this medication? °Side effects that you should report to your doctor or health care professional as soon as possible: °allergic reactions (skin rash; itching or hives; swelling of the face, lips, or tongue) °bleeding (bloody or black, tarry stools; red or dark brown urine; spitting up blood or brown material that looks like coffee grounds; red spots on the   skin; unusual bruising or bleeding from the eye, gums, or nose) °blurred vision or changes in vision °confusion °constipation °headache °heart failure (trouble breathing; fast, irregular heartbeat; sudden weight gain; swelling of the ankles, feet, hands) °infection (fever, chills, cough, sore throat, pain or trouble passing urine) °lack or loss of appetite °liver injury (dark yellow or brown urine; general ill feeling or flu-like symptoms; loss of appetite, right upper belly pain; yellowing of the eyes or skin) °low blood pressure (dizziness; feeling faint or lightheaded, falls; unusually weak or tired) °muscle cramps °pain, redness, or irritation at site where injected °pain, tingling,  numbness in the hands or feet °seizures °trouble breathing °unusual bruising or bleeding °Side effects that usually do not require medical attention (report to your doctor or health care professional if they continue or are bothersome): °diarrhea °nausea °stomach pain °trouble sleeping °vomiting °This list may not describe all possible side effects. Call your doctor for medical advice about side effects. You may report side effects to FDA at 1-800-FDA-1088. °Where should I keep my medication? °This medicine is given in a hospital or clinic. It will not be stored at home. °NOTE: This sheet is a summary. It may not cover all possible information. If you have questions about this medicine, talk to your doctor, pharmacist, or health care provider. °© 2022 [employer]/Gold Standard (2020-08-26 00:00:00) ° °Daratumumab; Hyaluronidase Injection °What is this medication? °DARATUMUMAB; HYALURONIDASE (dar a toom ue mab / hye al ur ON i dase) is a monoclonal antibody. Hyaluronidase is used to improve the effects of daratumumab. It treats certain types of cancer. Some of the cancers treated are multiple myeloma and light-chain amyloidosis. °This medicine may be used for other purposes; ask your health care provider or pharmacist if you have questions. °COMMON BRAND NAME(S): DARZALEX FASPRO °What should I tell my care team before I take this medication? °They need to know if you have any of these conditions: °heart disease °infection especially a viral infection such as chickenpox, cold sores, herpes, or hepatitis [place] °lung or breathing disease °an unusual or allergic reaction to daratumumab, hyaluronidase, other medicines, foods, dyes, or preservatives °pregnant or trying to get pregnant °breast-feeding °How should I use this medication? °This medicine is for injection under the skin. It is given by a health care professional in a hospital or clinic setting. °Talk to your pediatrician regarding the use of this medicine in children.  Special care may be needed. °Overdosage: If you think you have taken too much of this medicine contact a poison control center or emergency room at once. °NOTE: This medicine is only for you. Do not share this medicine with others. °What if I miss a dose? °Keep appointments for follow-up doses as directed. It is important not to miss your dose. Call your doctor or health care professional if you are unable to keep an appointment. °What may interact with this medication? °Interactions have not been studied. °This list may not describe all possible interactions. Give your health care provider a list of all the medicines, herbs, non-prescription drugs, or dietary supplements you use. Also tell them if you smoke, drink alcohol, or use illegal drugs. Some items may interact with your medicine. °What should I watch for while using this medication? °Your condition will be monitored carefully while you are receiving this medicine. °This medicine can cause serious allergic reactions. To reduce your risk, your health care provider may give you other medicine to take before receiving this one. Be sure to follow the directions from   your health care provider. °This medicine can affect the results of blood tests to match your blood type. These changes can last for up to 6 months after the final dose. Your healthcare provider will do blood tests to match your blood type before you start treatment. Tell all of your healthcare providers that you are being treated with this medicine before receiving a blood transfusion. °This medicine can affect the results of some tests used to determine treatment response; extra tests may be needed to evaluate response. °Do not become pregnant while taking this medicine or for 3 months after stopping it. Women should inform their health care provider if they wish to become pregnant or think they might be pregnant. There is a potential for serious side effects to an unborn child. Talk to your health  care provider for more information. Do not breast-feed an infant while taking this medicine. °What side effects may I notice from receiving this medication? °Side effects that you should report to your care team as soon as possible: °Allergic reactions--skin rash, itching or hives, swelling of the face, lips, or tongue °Blood clot--chest pain, shortness of breath, pain, swelling or warmth in the leg °Blurred vision °Fast, irregular heartbeat °Infection--fever, chills, cough, sore throat, pain or trouble passing urine °Injection reactions--dizziness, fast heartbeat, feeling faint or lightheaded, falls, headache, increase in blood pressure, nausea, vomiting, or wheezing or trouble breathing with loud or whistling sounds °Low red blood cell counts--trouble breathing, feeling faint, lightheaded or falls, unusually weak or tired °Unusual bleeding or bruising °Side effects that usually do not require medical attention (report these to your care team if they continue or are bothersome): °Back pain °Constipation °Diarrhea °Pain, tingling, numbness in the hands or feet °Pain, redness, or irritation at site where injected °Muscle cramp or pain °Swelling of the ankles, feet, hands °Tiredness °Trouble sleeping °This list may not describe all possible side effects. Call your doctor for medical advice about side effects. You may report side effects to FDA at 1-800-FDA-1088. °Where should I keep my medication? °This drug is given in a hospital or clinic and will not be stored at home. °NOTE: This sheet is a summary. It may not cover all possible information. If you have questions about this medicine, talk to your doctor, pharmacist, or health care provider. °© 2022 [employer]vier/[employer] Standard (2021-05-24 00:00:00) ° °

## 2021-10-17 NOTE — Progress Notes (Signed)
Patient presents for treatment. RN assessment completed along with the following:  Labs/vitals reviewed - Yes, and within treatment parameters.   Weight within 10% of previous measurement - Yes Informed consent completed and reflects current therapy/intent - Yes, on date Velcade 12/20/20 Darzalex fastpro 03/23/21             Provider progress note reviewed - Yes, today's provider note was reviewed. Treatment/Antibody/Supportive plan reviewed - Yes, and there are no adjustments needed for today's treatment. S&H and other orders reviewed - Yes, and there are no additional orders identified. Previous treatment date reviewed - Yes, and the appropriate amount of time has elapsed between treatments. Clinic Hand Off Received from - Danae Orleans, LPN  Patient to proceed with treatment.

## 2021-10-18 LAB — KAPPA/LAMBDA LIGHT CHAINS
Kappa free light chain: 9 mg/L (ref 3.3–19.4)
Kappa, lambda light chain ratio: 0.08 — ABNORMAL LOW (ref 0.26–1.65)
Lambda free light chains: 118.1 mg/L — ABNORMAL HIGH (ref 5.7–26.3)

## 2021-10-18 LAB — IGG: IgG (Immunoglobin G), Serum: 1101 mg/dL (ref 603–1613)

## 2021-10-19 LAB — PROTEIN ELECTROPHORESIS, SERUM
A/G Ratio: 1 (ref 0.7–1.7)
Albumin ELP: 3.5 g/dL (ref 2.9–4.4)
Alpha-1-Globulin: 0.2 g/dL (ref 0.0–0.4)
Alpha-2-Globulin: 0.9 g/dL (ref 0.4–1.0)
Beta Globulin: 1.1 g/dL (ref 0.7–1.3)
Gamma Globulin: 1.2 g/dL (ref 0.4–1.8)
Globulin, Total: 3.4 g/dL (ref 2.2–3.9)
M-Spike, %: 0.9 g/dL — ABNORMAL HIGH
Total Protein ELP: 6.9 g/dL (ref 6.0–8.5)

## 2021-11-13 ENCOUNTER — Other Ambulatory Visit: Payer: Self-pay | Admitting: Oncology

## 2021-11-14 ENCOUNTER — Inpatient Hospital Stay: Payer: Medicare Other

## 2021-11-14 ENCOUNTER — Other Ambulatory Visit: Payer: Self-pay

## 2021-11-14 ENCOUNTER — Inpatient Hospital Stay: Payer: Medicare Other | Attending: Oncology

## 2021-11-14 ENCOUNTER — Inpatient Hospital Stay (HOSPITAL_BASED_OUTPATIENT_CLINIC_OR_DEPARTMENT_OTHER): Payer: Medicare Other | Admitting: Oncology

## 2021-11-14 ENCOUNTER — Encounter: Payer: Self-pay | Admitting: *Deleted

## 2021-11-14 VITALS — BP 131/60 | HR 63 | Temp 97.8°F | Resp 20 | Ht 69.0 in | Wt 180.0 lb

## 2021-11-14 DIAGNOSIS — T451X5A Adverse effect of antineoplastic and immunosuppressive drugs, initial encounter: Secondary | ICD-10-CM

## 2021-11-14 DIAGNOSIS — C9000 Multiple myeloma not having achieved remission: Secondary | ICD-10-CM

## 2021-11-14 DIAGNOSIS — Z79899 Other long term (current) drug therapy: Secondary | ICD-10-CM | POA: Diagnosis not present

## 2021-11-14 DIAGNOSIS — Z9481 Bone marrow transplant status: Secondary | ICD-10-CM

## 2021-11-14 DIAGNOSIS — Z5112 Encounter for antineoplastic immunotherapy: Secondary | ICD-10-CM | POA: Diagnosis not present

## 2021-11-14 DIAGNOSIS — G620 Drug-induced polyneuropathy: Secondary | ICD-10-CM

## 2021-11-14 LAB — CMP (CANCER CENTER ONLY)
ALT: 12 U/L (ref 0–44)
AST: 20 U/L (ref 15–41)
Albumin: 4.2 g/dL (ref 3.5–5.0)
Alkaline Phosphatase: 42 U/L (ref 38–126)
Anion gap: 8 (ref 5–15)
BUN: 26 mg/dL — ABNORMAL HIGH (ref 8–23)
CO2: 25 mmol/L (ref 22–32)
Calcium: 10.3 mg/dL (ref 8.9–10.3)
Chloride: 104 mmol/L (ref 98–111)
Creatinine: 1.08 mg/dL (ref 0.61–1.24)
GFR, Estimated: >60 mL/min (ref >60)
Glucose, Bld: 80 mg/dL (ref 70–99)
Potassium: 4.2 mmol/L (ref 3.5–5.1)
Sodium: 137 mmol/L (ref 135–145)
Total Bilirubin: 0.4 mg/dL (ref 0.3–1.2)
Total Protein: 7.7 g/dL (ref 6.5–8.1)

## 2021-11-14 LAB — CBC WITH DIFFERENTIAL (CANCER CENTER ONLY)
Abs Immature Granulocytes: 0.01 10*3/uL (ref 0.00–0.07)
Basophils Absolute: 0 10*3/uL (ref 0.0–0.1)
Basophils Relative: 0 %
Eosinophils Absolute: 0.2 10*3/uL (ref 0.0–0.5)
Eosinophils Relative: 5 %
HCT: 34.9 % — ABNORMAL LOW (ref 39.0–52.0)
Hemoglobin: 11.1 g/dL — ABNORMAL LOW (ref 13.0–17.0)
Immature Granulocytes: 0 %
Lymphocytes Relative: 19 %
Lymphs Abs: 0.9 10*3/uL (ref 0.7–4.0)
MCH: 31.1 pg (ref 26.0–34.0)
MCHC: 31.8 g/dL (ref 30.0–36.0)
MCV: 97.8 fL (ref 80.0–100.0)
Monocytes Absolute: 0.8 10*3/uL (ref 0.1–1.0)
Monocytes Relative: 17 %
Neutro Abs: 2.8 10*3/uL (ref 1.7–7.7)
Neutrophils Relative %: 59 %
Platelet Count: 134 10*3/uL — ABNORMAL LOW (ref 150–400)
RBC: 3.57 MIL/uL — ABNORMAL LOW (ref 4.22–5.81)
RDW: 15.9 % — ABNORMAL HIGH (ref 11.5–15.5)
WBC Count: 4.8 10*3/uL (ref 4.0–10.5)
nRBC: 0 % (ref 0.0–0.2)

## 2021-11-14 MED ORDER — BORTEZOMIB CHEMO SQ INJECTION 3.5 MG (2.5MG/ML)
1.3000 mg/m2 | Freq: Once | INTRAMUSCULAR | Status: AC
  Administered 2021-11-14: 2.5 mg via SUBCUTANEOUS
  Filled 2021-11-14: qty 1

## 2021-11-14 MED ORDER — DARATUMUMAB-HYALURONIDASE-FIHJ 1800-30000 MG-UT/15ML ~~LOC~~ SOLN
1800.0000 mg | Freq: Once | SUBCUTANEOUS | Status: AC
  Administered 2021-11-14: 1800 mg via SUBCUTANEOUS
  Filled 2021-11-14: qty 15

## 2021-11-14 MED ORDER — ACETAMINOPHEN 325 MG PO TABS
650.0000 mg | ORAL_TABLET | Freq: Once | ORAL | Status: AC
  Administered 2021-11-14: 650 mg via ORAL
  Filled 2021-11-14: qty 2

## 2021-11-14 MED ORDER — DIPHENHYDRAMINE HCL 25 MG PO CAPS
25.0000 mg | ORAL_CAPSULE | Freq: Once | ORAL | Status: AC
  Administered 2021-11-14: 25 mg via ORAL
  Filled 2021-11-14: qty 1

## 2021-11-14 NOTE — Progress Notes (Signed)
Patient presents for treatment. RN assessment completed along with the following:  Labs/vitals reviewed - Yes, and please see collab nurse note.    Weight within 10% of previous measurement - Yes Informed consent completed and reflects current therapy/intent - Yes, on date 11/25/2018 Velcade; 03/23/2021 Darzalex Faspro                Provider progress note reviewed - Today's provider note is not yet available. I reviewed the most recent oncology provider progress note in chart dated 10/17/2021. Treatment/Antibody/Supportive plan reviewed - Yes, and there are no adjustments needed for today's treatment. S&H and other orders reviewed - Yes, and there are no additional orders identified. Previous treatment date reviewed - Yes, and the appropriate amount of time has elapsed between treatments. Clinic Hand Off Received from - Yes from [facility], RN  Patient to proceed with treatment.

## 2021-11-14 NOTE — Progress Notes (Signed)
Patient seen by Dr. Benay Spice today  Vitals are within treatment parameters.  Labs reviewed by Dr. Benay Spice and are within treatment parameters.  Per physician team, patient is ready for treatment. Please note that modifications are being made to the treatment plan including Dexamethasone being removed from treatment plan. Plan to start oral cytoxan with next treatment.

## 2021-11-14 NOTE — Progress Notes (Signed)
Provided printed information on oral cyclophosphamide and procedure to obtain per Oral Chemotherapy Program.

## 2021-11-14 NOTE — Patient Instructions (Signed)
[facility]   Discharge Instructions: Thank you for choosing [facility] to provide your oncology and hematology care.   If you have a lab appointment with the [facility], please go directly to the [facility] and check in at the registration area.   Wear comfortable clothing and clothing appropriate for easy access to any Portacath or PICC line.   We strive to give you quality time with your provider. You may need to reschedule your appointment if you arrive late (15 or more minutes).  Arriving late affects you and other patients whose appointments are after yours.  Also, if you miss three or more appointments without notifying the office, you may be dismissed from the clinic at the providers discretion.      For prescription refill requests, have your pharmacy contact our office and allow 72 hours for refills to be completed.    Today you received the following chemotherapy and/or immunotherapy agents Bortezomib (VELCADE) & Daratumumab-hyaluronidase-fihj (DARZALEX FASPRO).      To help prevent nausea and vomiting after your treatment, we encourage you to take your nausea medication as directed.  BELOW ARE SYMPTOMS THAT SHOULD BE REPORTED IMMEDIATELY: *FEVER GREATER THAN 100.4 F (38 C) OR HIGHER *CHILLS OR SWEATING *NAUSEA AND VOMITING THAT IS NOT CONTROLLED WITH YOUR NAUSEA MEDICATION *UNUSUAL SHORTNESS OF BREATH *UNUSUAL BRUISING OR BLEEDING *URINARY PROBLEMS (pain or burning when urinating, or frequent urination) *BOWEL PROBLEMS (unusual diarrhea, constipation, pain near the anus) TENDERNESS IN MOUTH AND THROAT WITH OR WITHOUT PRESENCE OF ULCERS (sore throat, sores in mouth, or a toothache) UNUSUAL RASH, SWELLING OR PAIN  UNUSUAL VAGINAL DISCHARGE OR ITCHING   Items with * indicate a potential emergency and should be followed up as soon as possible or go to the Emergency Department if any problems should occur.  Please show the  CHEMOTHERAPY ALERT CARD or IMMUNOTHERAPY ALERT CARD at check-in to the Emergency Department and triage nurse.  Should you have questions after your visit or need to cancel or reschedule your appointment, please contact [facility]  Dept: [phone]  and follow the prompts.  Office hours are 8:00 a.m. to 4:30 p.m. Monday - Friday. Please note that voicemails left after 4:00 p.m. may not be returned until the following business day.  We are closed weekends and major holidays. You have access to a nurse at all times for urgent questions. Please call the main number to the clinic Dept: [phone] and follow the prompts.   For any non-urgent questions, you may also contact your provider using MyChart. We now offer e-Visits for anyone 57 and older to request care online for non-urgent symptoms. For details visit [email or web address].   Also download the MyChart app! Go to the app store, search "MyChart", open the app, select [facility], and log in with your MyChart username and password.  Due to Covid, a mask is required upon entering the hospital/clinic. If you do not have a mask, one will be given to you upon arrival. For doctor visits, patients may have 1 support person aged 93 or older with them. For treatment visits, patients cannot have anyone with them due to current Covid guidelines and our immunocompromised population.   Bortezomib injection What is this medication? BORTEZOMIB (bor TEZ oh mib) targets proteins in cancer cells and stops the cancer cells from growing. It treats multiple myeloma and mantle cell lymphoma. This medicine may be used for other purposes; ask your health care  provider or pharmacist if you have questions. °COMMON BRAND NAME(S): Velcade °What should I tell my care team before I take this medication? °They need to know if you have any of these conditions: °dehydration °diabetes (high blood sugar) °heart disease °liver disease °tingling of the  fingers or toes or other nerve disorder °an unusual or allergic reaction to bortezomib, mannitol, boron, other medicines, foods, dyes, or preservatives °pregnant or trying to get pregnant °breast-feeding °How should I use this medication? °This medicine is injected into a vein or under the skin. It is given by a health care provider in a hospital or clinic setting. °Talk to your health care provider about the use of this medicine in children. Special care may be needed. °Overdosage: If you think you have taken too much of this medicine contact a poison control center or emergency room at once. °NOTE: This medicine is only for you. Do not share this medicine with others. °What if I miss a dose? °Keep appointments for follow-up doses. It is important not to miss your dose. Call your health care provider if you are unable to keep an appointment. °What may interact with this medication? °This medicine may interact with the following medications: °ketoconazole °rifampin °This list may not describe all possible interactions. Give your health care provider a list of all the medicines, herbs, non-prescription drugs, or dietary supplements you use. Also tell them if you smoke, drink alcohol, or use illegal drugs. Some items may interact with your medicine. °What should I watch for while using this medication? °Your condition will be monitored carefully while you are receiving this medicine. °You may need blood work done while you are taking this medicine. °You may get drowsy or dizzy. Do not drive, use machinery, or do anything that needs mental alertness until you know how this medicine affects you. Do not stand up or sit up quickly, especially if you are an older patient. This reduces the risk of dizzy or fainting spells °This medicine may increase your risk of getting an infection. Call your health care provider for advice if you get a fever, chills, sore throat, or other symptoms of a cold or flu. Do not treat yourself.  Try to avoid being around people who are sick. °Check with your health care provider if you have severe diarrhea, nausea, and vomiting, or if you sweat a lot. The loss of too much body fluid may make it dangerous for you to take this medicine. °Do not become pregnant while taking this medicine or for 7 months after stopping it. Women should inform their health care provider if they wish to become pregnant or think they might be pregnant. Men should not father a child while taking this medicine and for 4 months after stopping it. There is a potential for serious harm to an unborn child. Talk to your health care provider for more information. Do not breast-feed an infant while taking this medicine or for 2 months after stopping it. °This medicine may make it more difficult to get pregnant or father a child. Talk to your health care provider if you are concerned about your fertility. °What side effects may I notice from receiving this medication? °Side effects that you should report to your doctor or health care professional as soon as possible: °allergic reactions (skin rash; itching or hives; swelling of the face, lips, or tongue) °bleeding (bloody or black, tarry stools; red or dark brown urine; spitting up blood or brown material that looks like coffee grounds;   red spots on the skin; unusual bruising or bleeding from the eye, gums, or nose) blurred vision or changes in vision confusion constipation headache heart failure (trouble breathing; fast, irregular heartbeat; sudden weight gain; swelling of the ankles, feet, hands) infection (fever, chills, cough, sore throat, pain or trouble passing urine) lack or loss of appetite liver injury (dark yellow or brown urine; general ill feeling or flu-like symptoms; loss of appetite, right upper belly pain; yellowing of the eyes or skin) low blood pressure (dizziness; feeling faint or lightheaded, falls; unusually weak or tired) muscle cramps pain, redness, or  irritation at site where injected pain, tingling, numbness in the hands or feet seizures trouble breathing unusual bruising or bleeding Side effects that usually do not require medical attention (report to your doctor or health care professional if they continue or are bothersome): diarrhea nausea stomach pain trouble sleeping vomiting This list may not describe all possible side effects. Call your doctor for medical advice about side effects. You may report side effects to FDA at 1-800-FDA-1088. Where should I keep my medication? This medicine is given in a hospital or clinic. It will not be stored at home. NOTE: This sheet is a summary. It may not cover all possible information. If you have questions about this medicine, talk to your doctor, pharmacist, or health care provider.  2022 Elsevier/Gold Standard (2020-08-26 00:00:00)  Daratumumab; Hyaluronidase Injection What is this medication? DARATUMUMAB; HYALURONIDASE (dar a toom ue mab / hye al ur ON i dase) is a monoclonal antibody. Hyaluronidase is used to improve the effects of daratumumab. It treats certain types of cancer. Some of the cancers treated are multiple myeloma and light-chain amyloidosis. This medicine may be used for other purposes; ask your health care provider or pharmacist if you have questions. COMMON BRAND NAME(S): DARZALEX FASPRO What should I tell my care team before I take this medication? They need to know if you have any of these conditions: heart disease infection especially a viral infection such as chickenpox, cold sores, herpes, or hepatitis Porter lung or breathing disease an unusual or allergic reaction to daratumumab, hyaluronidase, other medicines, foods, dyes, or preservatives pregnant or trying to get pregnant breast-feeding How should I use this medication? This medicine is for injection under the skin. It is given by a health care professional in a hospital or clinic setting. Talk to your  pediatrician regarding the use of this medicine in children. Special care may be needed. Overdosage: If you think you have taken too much of this medicine contact a poison control center or emergency room at once. NOTE: This medicine is only for you. Do not share this medicine with others. What if I miss a dose? Keep appointments for follow-up doses as directed. It is important not to miss your dose. Call your doctor or health care professional if you are unable to keep an appointment. What may interact with this medication? Interactions have not been studied. This list may not describe all possible interactions. Give your health care provider a list of all the medicines, herbs, non-prescription drugs, or dietary supplements you use. Also tell them if you smoke, drink alcohol, or use illegal drugs. Some items may interact with your medicine. What should I watch for while using this medication? Your condition will be monitored carefully while you are receiving this medicine. This medicine can cause serious allergic reactions. To reduce your risk, your health care provider may give you other medicine to take before receiving this one. Be sure to  follow the directions from your health care provider. This medicine can affect the results of blood tests to match your blood type. These changes can last for up to 6 months after the final dose. Your healthcare provider will do blood tests to match your blood type before you start treatment. Tell all of your healthcare providers that you are being treated with this medicine before receiving a blood transfusion. This medicine can affect the results of some tests used to determine treatment response; extra tests may be needed to evaluate response. Do not become pregnant while taking this medicine or for 3 months after stopping it. Women should inform their health care provider if they wish to become pregnant or think they might be pregnant. There is a potential for  serious side effects to an unborn child. Talk to your health care provider for more information. Do not breast-feed an infant while taking this medicine. What side effects may I notice from receiving this medication? Side effects that you should report to your care team as soon as possible: Allergic reactions--skin rash, itching or hives, swelling of the face, lips, or tongue Blood clot--chest pain, shortness of breath, pain, swelling or warmth in the leg Blurred vision Fast, irregular heartbeat Infection--fever, chills, cough, sore throat, pain or trouble passing urine Injection reactions--dizziness, fast heartbeat, feeling faint or lightheaded, falls, headache, increase in blood pressure, nausea, vomiting, or wheezing or trouble breathing with loud or whistling sounds Low red blood cell counts--trouble breathing, feeling faint, lightheaded or falls, unusually weak or tired Unusual bleeding or bruising Side effects that usually do not require medical attention (report these to your care team if they continue or are bothersome): Back pain Constipation Diarrhea Pain, tingling, numbness in the hands or feet Pain, redness, or irritation at site where injected Muscle cramp or pain Swelling of the ankles, feet, hands Tiredness Trouble sleeping This list may not describe all possible side effects. Call your doctor for medical advice about side effects. You may report side effects to FDA at 1-800-FDA-1088. Where should I keep my medication? This drug is given in a hospital or clinic and will not be stored at home. NOTE: This sheet is a summary. It may not cover all possible information. If you have questions about this medicine, talk to your doctor, pharmacist, or health care provider.  2022 Elsevier/Gold Standard (2021-05-24 00:00:00)

## 2021-11-14 NOTE — Progress Notes (Signed)
[name] OFFICE PROGRESS NOTE   Diagnosis: Multiple myeloma  INTERVAL HISTORY:   Andre [name] completed another cycle of daratumumab/Velcade on 10/17/2021.  No peripheral neuropathy symptoms.  Mild nausea following chemotherapy, relieved with antiemetics.  He continues to exercise 6 days/week.  He is here today with his wife.  She reports he becomes very confused for several days after taking Decadron.  Objective:  Vital signs in last 24 hours:  Blood pressure 131/60, pulse 63, temperature 97.8 F (36.6 C), temperature source Oral, resp. rate 20, height 5' 9"  (1.753 m), weight 180 lb (81.6 kg), SpO2 100 %.    HEENT: No thrush Resp: Lungs clear bilaterally Cardio: Regular rate and rhythm GI: No hepatosplenomegaly Vascular: No leg edema  Lab Results:  Lab Results  Component Value Date   WBC 4.8 11/14/2021   HGB 11.1 (L) 11/14/2021   HCT 34.9 (L) 11/14/2021   MCV 97.8 11/14/2021   PLT 134 (L) 11/14/2021   NEUTROABS 2.8 11/14/2021    CMP  Lab Results  Component Value Date   NA 135 10/17/2021   K 4.5 10/17/2021   CL 102 10/17/2021   CO2 24 10/17/2021   GLUCOSE 91 10/17/2021   BUN 29 (H) 10/17/2021   CREATININE 1.15 10/17/2021   CALCIUM 10.0 10/17/2021   PROT 7.3 10/17/2021   ALBUMIN 4.2 10/17/2021   AST 19 10/17/2021   ALT 11 10/17/2021   ALKPHOS 48 10/17/2021   BILITOT 0.4 10/17/2021   GFRNONAA >60 10/17/2021   GFRAA >60 06/15/2020     Medications: I have reviewed the patient's current medications.   Assessment/Plan: Multiple myeloma- IgG lambda  bortezomib (subcutaneously), lenalidomide, and dexamethasone, with repeat bone marrow biopsy May of 2012 showing 10% plasmacytosis   (2) high-dose chemotherapy with BCNU and melphalan at [facility], followed by stem cell rescue July of 2012   (3) on zoledronic acid started December of 2012, initially monthly, currently given every 3 months, most recent dose  12/07/2015   (4) low-dose lenalidomide  resumed April 2013, interrupted several times.  Resumed again on 02/19/2013, ata dose of 5 mg daily, 21 days on and 7 days off, later further reduced to 7 days on, 7 days off   (5) CNS symptoms and abnormal brain MRI extensively evaluated by neurology with no definitive diagnosis established   (6) rising M spike noted June 2014 but did not meet criteria for carfilzomib study   (7) on lenalidomide 25 mg daily, 14 days on, 7 days off, starting 04/18/2013, interrupted December 2014 because of rash;             (a) resumed January 2015 at 10 mg/ day at 21 days on/ 7 days off             (Casey) starting 08/18/2014 decreased to 10 mg/ day 14 days on, 7 days off because of cytopenias             (c) as of February 2016 was on 5 mg daily 7 days on 7 days off             (d) lenalidomide discontinued December 2016 with evidence of disease progression   (8) transient global amnesia 05/29/2015, resolved without intervention   (9) starting PVD 10/25/2015 w ASA 325 thromboprophylaxis, valacyclovir prophylaxis, last dose 12/17/2015             (a) pomalidomide 4 mg/d days 1-14             ([place]) bortezomib  sQ days 2,5,9,12 of each 21 day cycle             (c) dexamethasome 20 mg two days a week             (d) dexamethasone bortezomib and pomalidomide discontinued late December 2018 with poor tolerance   (10) metapneumovirus pneumonia April 2017             (a) completing course of steroids and week of bactrim mid April 2017   (11) status post second autologous transplant at [name] 02/04/2016(preparatory regimen melphalan 200 mg/m)             (a) received twelve-month vaccinations 03/14/2017 (DPT, Haemophilus, Pneumovax 13, polio)             ([place]io) 14 months injections 05/04/2016 include DTaP, Hib conjugate, HepB energex Asim 20 mcg/ml, Prevnar 13             (c) 24 month vaccines due at [facility] June 2019   (12) maintenance therapy started November 2017, consisting of             (a) bortezomib 1.3 mg/M2 every 14  days, first dose 07/27/2016             (Courvoisier) pomalidomide 1 mg days 1-21 Q28 days, started 07/19/2016             (c) zolendronate monthly started 07/27/2016 (previously Q 3 months) however patient unable to tolerate, and changed back to q3 months in April, 2018             (d) Bortezomib changed to monthly as of June 2018 because of tolerance issues, however discontinued after 09/14/2017 dose because of a rise in his M spike             (e) pomalidomide held after 10/18/2017 in preparation for possible study at [place] (venetoclax)--never resumed             (f) with numbers actually improved off treatment, resumed every 2-week bortezomib 12/25/2017             (g) changed to every 3-week bortezomib as of 03/17/2019             (h) briefly on weekly treatments times 22 October 2020 due to increase in M spike             (I) maintenance therapy discontinued with evidence of progression   (13) bortezomib/daratumumab/dexamethasone started 12/20/2020.             (a) Decadron dose dropped to 10 mg day of and day following treatment starting May 2022             (Darrell) day 8 bortezomib omitted beginning with the June cycle per patient preference             (C) treatment changed to monthly daratumumab/Velcade/Decadron beginning 06/27/2021-patient decision       Disposition: Andre [name] appears unchanged.  The myeloma markers have increased over the past few months.  We discussed treatment options.  He has difficulty tolerating Decadron, even at a reduced dose.  He would like to discontinue Decadron.  We discussed changing to a more standard weekly Velcade regimen with continuation of daratumumab.  We also discussed the addition of cyclophosphamide.  He is not scheduled to see Dr. Alvie Heidelberg until May.  I will contact her to get her thoughts.  We made a tentative plan to begin weekly Velcade.  Decadron will be discontinued from the  systemic therapy regimen.  He will return for an office visit in 1  month.    Betsy Coder, MD  11/14/2021  10:10 AM

## 2021-11-15 ENCOUNTER — Encounter: Payer: Self-pay | Admitting: *Deleted

## 2021-11-15 LAB — KAPPA/LAMBDA LIGHT CHAINS
Kappa free light chain: 9.8 mg/L (ref 3.3–19.4)
Kappa, lambda light chain ratio: 0.06 — ABNORMAL LOW (ref 0.26–1.65)
Lambda free light chains: 161.7 mg/L — ABNORMAL HIGH (ref 5.7–26.3)

## 2021-11-16 LAB — PROTEIN ELECTROPHORESIS, SERUM
A/G Ratio: 1.1 (ref 0.7–1.7)
Albumin ELP: 3.8 g/dL (ref 2.9–4.4)
Alpha-1-Globulin: 0.2 g/dL (ref 0.0–0.4)
Alpha-2-Globulin: 0.9 g/dL (ref 0.4–1.0)
Beta Globulin: 1.1 g/dL (ref 0.7–1.3)
Gamma Globulin: 1.2 g/dL (ref 0.4–1.8)
Globulin, Total: 3.5 g/dL (ref 2.2–3.9)
M-Spike, %: 0.9 g/dL — ABNORMAL HIGH
Total Protein ELP: 7.3 g/dL (ref 6.0–8.5)

## 2021-11-17 ENCOUNTER — Other Ambulatory Visit: Payer: Self-pay

## 2021-11-17 ENCOUNTER — Other Ambulatory Visit (HOSPITAL_BASED_OUTPATIENT_CLINIC_OR_DEPARTMENT_OTHER): Payer: Self-pay

## 2021-11-17 ENCOUNTER — Other Ambulatory Visit: Payer: Self-pay | Admitting: Oncology

## 2021-11-18 ENCOUNTER — Encounter: Payer: Self-pay | Admitting: Oncology

## 2021-11-18 ENCOUNTER — Other Ambulatory Visit (HOSPITAL_BASED_OUTPATIENT_CLINIC_OR_DEPARTMENT_OTHER): Payer: Self-pay

## 2021-11-18 MED ORDER — LEVOTHYROXINE SODIUM 150 MCG PO TABS
ORAL_TABLET | ORAL | 1 refills | Status: DC
  Filled 2021-11-18: qty 90, 90d supply, fill #0

## 2021-11-21 ENCOUNTER — Inpatient Hospital Stay: Payer: Medicare Other | Attending: Oncology

## 2021-11-21 ENCOUNTER — Inpatient Hospital Stay: Payer: Medicare Other

## 2021-11-21 ENCOUNTER — Other Ambulatory Visit: Payer: Self-pay

## 2021-11-21 ENCOUNTER — Other Ambulatory Visit: Payer: Self-pay | Admitting: Oncology

## 2021-11-21 VITALS — BP 111/61 | HR 78 | Temp 98.5°F | Resp 20 | Ht 69.0 in | Wt 181.2 lb

## 2021-11-21 DIAGNOSIS — C9000 Multiple myeloma not having achieved remission: Secondary | ICD-10-CM

## 2021-11-21 DIAGNOSIS — Z79899 Other long term (current) drug therapy: Secondary | ICD-10-CM | POA: Diagnosis not present

## 2021-11-21 DIAGNOSIS — Z5112 Encounter for antineoplastic immunotherapy: Secondary | ICD-10-CM | POA: Insufficient documentation

## 2021-11-21 DIAGNOSIS — Z9481 Bone marrow transplant status: Secondary | ICD-10-CM

## 2021-11-21 DIAGNOSIS — G620 Drug-induced polyneuropathy: Secondary | ICD-10-CM

## 2021-11-21 LAB — CBC WITH DIFFERENTIAL (CANCER CENTER ONLY)
Abs Immature Granulocytes: 0.03 10*3/uL (ref 0.00–0.07)
Basophils Absolute: 0 10*3/uL (ref 0.0–0.1)
Basophils Relative: 0 %
Eosinophils Absolute: 0.1 10*3/uL (ref 0.0–0.5)
Eosinophils Relative: 2 %
HCT: 33.6 % — ABNORMAL LOW (ref 39.0–52.0)
Hemoglobin: 11 g/dL — ABNORMAL LOW (ref 13.0–17.0)
Immature Granulocytes: 0 %
Lymphocytes Relative: 17 %
Lymphs Abs: 1.1 10*3/uL (ref 0.7–4.0)
MCH: 31.7 pg (ref 26.0–34.0)
MCHC: 32.7 g/dL (ref 30.0–36.0)
MCV: 96.8 fL (ref 80.0–100.0)
Monocytes Absolute: 0.9 10*3/uL (ref 0.1–1.0)
Monocytes Relative: 14 %
Neutro Abs: 4.5 10*3/uL (ref 1.7–7.7)
Neutrophils Relative %: 67 %
Platelet Count: 121 10*3/uL — ABNORMAL LOW (ref 150–400)
RBC: 3.47 MIL/uL — ABNORMAL LOW (ref 4.22–5.81)
RDW: 15.9 % — ABNORMAL HIGH (ref 11.5–15.5)
WBC Count: 6.8 10*3/uL (ref 4.0–10.5)
nRBC: 0 % (ref 0.0–0.2)

## 2021-11-21 MED ORDER — BORTEZOMIB CHEMO SQ INJECTION 3.5 MG (2.5MG/ML)
1.3000 mg/m2 | Freq: Once | INTRAMUSCULAR | Status: AC
  Administered 2021-11-21: 2.5 mg via SUBCUTANEOUS
  Filled 2021-11-21: qty 1

## 2021-11-21 NOTE — Patient Instructions (Signed)
[facility]   °Discharge Instructions: °Thank you for choosing [facility] to provide your oncology and hematology care.  ° °If you have a lab appointment with the Cancer Center, please go directly to the Cancer Center and check in at the registration area. °  °Wear comfortable clothing and clothing appropriate for easy access to any Portacath or PICC line.  ° °We strive to give you quality time with your provider. You may need to reschedule your appointment if you arrive late (15 or more minutes).  Arriving late affects you and other patients whose appointments are after yours.  Also, if you miss three or more appointments without notifying the office, you may be dismissed from the clinic at the provider’s discretion.    °  °For prescription refill requests, have your pharmacy contact our office and allow 72 hours for refills to be completed.   ° °Today you received the following chemotherapy and/or immunotherapy agents Bortezomib (VELCADE).    °  °To help prevent nausea and vomiting after your treatment, we encourage you to take your nausea medication as directed. ° °BELOW ARE SYMPTOMS THAT SHOULD BE REPORTED IMMEDIATELY: °*FEVER GREATER THAN 100.4 F (38 °C) OR HIGHER °*CHILLS OR SWEATING °*NAUSEA AND VOMITING THAT IS NOT CONTROLLED WITH YOUR NAUSEA MEDICATION °*UNUSUAL SHORTNESS OF BREATH °*UNUSUAL BRUISING OR BLEEDING °*URINARY PROBLEMS (pain or burning when urinating, or frequent urination) °*BOWEL PROBLEMS (unusual diarrhea, constipation, pain near the anus) °TENDERNESS IN MOUTH AND THROAT WITH OR WITHOUT PRESENCE OF ULCERS (sore throat, sores in mouth, or a toothache) °UNUSUAL RASH, SWELLING OR PAIN  °UNUSUAL VAGINAL DISCHARGE OR ITCHING  ° °Items with * indicate a potential emergency and should be followed up as soon as possible or go to the Emergency Department if any problems should occur. ° °Please show the CHEMOTHERAPY ALERT CARD or IMMUNOTHERAPY ALERT CARD at  check-in to the Emergency Department and triage nurse. ° °Should you have questions after your visit or need to cancel or reschedule your appointment, please contact [facility]  Dept: [phone]  and follow the prompts.  Office hours are 8:00 a.m. to 4:30 p.m. Monday - Friday. Please note that voicemails left after 4:00 p.m. may not be returned until the following business day.  We are closed weekends and major holidays. You have access to a nurse at all times for urgent questions. Please call the main number to the clinic Dept: [phone] and follow the prompts. ° ° °For any non-urgent questions, you may also contact your provider using MyChart. We now offer e-Visits for anyone 18 and older to request care online for non-urgent symptoms. For details visit [email or web address]. °  °Also download the MyChart app! Go to the app store, search "MyChart", open the app, select [facility], and log in with your MyChart username and password. ° °Due to Covid, a mask is required upon entering the hospital/clinic. If you do not have a mask, one will be given to you upon arrival. For doctor visits, patients may have 1 support person aged 18 or older with them. For treatment visits, patients cannot have anyone with them due to current Covid guidelines and our immunocompromised population.  ° °Bortezomib injection °What is this medication? °BORTEZOMIB (bor TEZ oh mib) targets proteins in cancer cells and stops the cancer cells from growing. It treats multiple myeloma and mantle cell lymphoma. °This medicine may be used for other purposes; ask your health care provider or pharmacist if   you have questions. COMMON BRAND NAME(S): Velcade What should I tell my care team before I take this medication? They need to know if you have any of these conditions: dehydration diabetes (high blood sugar) heart disease liver disease tingling of the fingers or toes or other nerve disorder an unusual or  allergic reaction to bortezomib, mannitol, boron, other medicines, foods, dyes, or preservatives pregnant or trying to get pregnant breast-feeding How should I use this medication? This medicine is injected into a vein or under the skin. It is given by a health care provider in a hospital or clinic setting. Talk to your health care provider about the use of this medicine in children. Special care may be needed. Overdosage: If you think you have taken too much of this medicine contact a poison control center or emergency room at once. NOTE: This medicine is only for you. Do not share this medicine with others. What if I miss a dose? Keep appointments for follow-up doses. It is important not to miss your dose. Call your health care provider if you are unable to keep an appointment. What may interact with this medication? This medicine may interact with the following medications: ketoconazole rifampin This list may not describe all possible interactions. Give your health care provider a list of all the medicines, herbs, non-prescription drugs, or dietary supplements you use. Also tell them if you smoke, drink alcohol, or use illegal drugs. Some items may interact with your medicine. What should I watch for while using this medication? Your condition will be monitored carefully while you are receiving this medicine. You may need blood work done while you are taking this medicine. You may get drowsy or dizzy. Do not drive, use machinery, or do anything that needs mental alertness until you know how this medicine affects you. Do not stand up or sit up quickly, especially if you are an older patient. This reduces the risk of dizzy or fainting spells This medicine may increase your risk of getting an infection. Call your health care provider for advice if you get a fever, chills, sore throat, or other symptoms of a cold or flu. Do not treat yourself. Try to avoid being around people who are sick. Check  with your health care provider if you have severe diarrhea, nausea, and vomiting, or if you sweat a lot. The loss of too much body fluid may make it dangerous for you to take this medicine. Do not become pregnant while taking this medicine or for 7 months after stopping it. Women should inform their health care provider if they wish to become pregnant or think they might be pregnant. Men should not father a child while taking this medicine and for 4 months after stopping it. There is a potential for serious harm to an unborn child. Talk to your health care provider for more information. Do not breast-feed an infant while taking this medicine or for 2 months after stopping it. This medicine may make it more difficult to get pregnant or father a child. Talk to your health care provider if you are concerned about your fertility. What side effects may I notice from receiving this medication? Side effects that you should report to your doctor or health care professional as soon as possible: allergic reactions (skin rash; itching or hives; swelling of the face, lips, or tongue) bleeding (bloody or black, tarry stools; red or dark brown urine; spitting up blood or brown material that looks like coffee grounds; red spots on the  skin; unusual bruising or bleeding from the eye, gums, or nose) blurred vision or changes in vision confusion constipation headache heart failure (trouble breathing; fast, irregular heartbeat; sudden weight gain; swelling of the ankles, feet, hands) infection (fever, chills, cough, sore throat, pain or trouble passing urine) lack or loss of appetite liver injury (dark yellow or brown urine; general ill feeling or flu-like symptoms; loss of appetite, right upper belly pain; yellowing of the eyes or skin) low blood pressure (dizziness; feeling faint or lightheaded, falls; unusually weak or tired) muscle cramps pain, redness, or irritation at site where injected pain, tingling,  numbness in the hands or feet seizures trouble breathing unusual bruising or bleeding Side effects that usually do not require medical attention (report to your doctor or health care professional if they continue or are bothersome): diarrhea nausea stomach pain trouble sleeping vomiting This list may not describe all possible side effects. Call your doctor for medical advice about side effects. You may report side effects to FDA at 1-800-FDA-1088. Where should I keep my medication? This medicine is given in a hospital or clinic. It will not be stored at home. NOTE: This sheet is a summary. It may not cover all possible information. If you have questions about this medicine, talk to your doctor, pharmacist, or health care provider.  2022 Elsevier/Gold Standard (2020-08-26 00:00:00)

## 2021-11-21 NOTE — Progress Notes (Signed)
Patient presents for treatment. RN assessment completed along with the following: ? ?Labs/vitals reviewed - Yes, and within treatment parameters.   ?Weight within 10% of previous measurement - Yes ?Informed consent completed and reflects current therapy/intent - Yes, on date 01/12/2020             ?Provider progress note reviewed - Patient not seen by provider today. Most recent note dated 11/14/2021 reviewed. ?Treatment/Antibody/Supportive plan reviewed - Yes, and there are no adjustments needed for today's treatment. ?S&H and other orders reviewed - Yes, and there are no additional orders identified. ?Previous treatment date reviewed - Yes, and the appropriate amount of time has elapsed between treatments. ?Clinic Hand Off Received from - No. ? ?Patient to proceed with treatment.  ? ?

## 2021-11-23 ENCOUNTER — Telehealth: Payer: Self-pay

## 2021-11-23 NOTE — Telephone Encounter (Signed)
Per Dr Benay Spice Informed Pt that [facility] spoke with Dr Alvie Heidelberg with [facility] relayed to Pt that Dr Alvie Heidelberg agrees with the weekly velcade and also agrees with the adding cytoxan. Per Dr Benay Spice he will prescribe oral cytoxan to start next week. Informed Pt I will print out information on cytoxan to give to him on Monday. Pt verbalized understanding. ?

## 2021-11-23 NOTE — Telephone Encounter (Signed)
TC from Pt stating he would like Dr Benay Spice to get in contact with Dr Alvie Heidelberg with [facility]. TC to Dr Kendell Bane office a she was paged twice to return call to Dr Carin Hock office No return call as of yet. ?

## 2021-11-25 ENCOUNTER — Encounter: Payer: Self-pay | Admitting: Oncology

## 2021-11-25 ENCOUNTER — Other Ambulatory Visit: Payer: Self-pay | Admitting: Oncology

## 2021-11-25 ENCOUNTER — Telehealth: Payer: Self-pay

## 2021-11-25 ENCOUNTER — Other Ambulatory Visit (HOSPITAL_COMMUNITY): Payer: Self-pay

## 2021-11-25 MED ORDER — CYCLOPHOSPHAMIDE 50 MG PO CAPS
250.0000 mg | ORAL_CAPSULE | ORAL | 0 refills | Status: DC
  Filled 2021-11-25 – 2021-11-28 (×3): qty 20, 28d supply, fill #0

## 2021-11-25 NOTE — Telephone Encounter (Signed)
Pt was to start prescription for cytoxan oral on 11/28/21 because prescription was sent in on 11/25/21 prior authorization needed for prescription. Pt informed this is being worked on. Medication will start next cycle. Pt verbalized understanding. ?

## 2021-11-27 ENCOUNTER — Other Ambulatory Visit: Payer: Self-pay | Admitting: Oncology

## 2021-11-28 ENCOUNTER — Other Ambulatory Visit: Payer: Self-pay

## 2021-11-28 ENCOUNTER — Inpatient Hospital Stay: Payer: Medicare Other

## 2021-11-28 ENCOUNTER — Encounter: Payer: Self-pay | Admitting: Oncology

## 2021-11-28 ENCOUNTER — Other Ambulatory Visit (HOSPITAL_COMMUNITY): Payer: Self-pay

## 2021-11-28 ENCOUNTER — Other Ambulatory Visit: Payer: Self-pay | Admitting: *Deleted

## 2021-11-28 ENCOUNTER — Telehealth: Payer: Self-pay | Admitting: Pharmacist

## 2021-11-28 ENCOUNTER — Other Ambulatory Visit (HOSPITAL_BASED_OUTPATIENT_CLINIC_OR_DEPARTMENT_OTHER): Payer: Self-pay

## 2021-11-28 ENCOUNTER — Telehealth: Payer: Self-pay

## 2021-11-28 VITALS — BP 113/51 | HR 76 | Temp 97.8°F | Resp 18 | Ht 69.0 in | Wt 182.4 lb

## 2021-11-28 DIAGNOSIS — Z5112 Encounter for antineoplastic immunotherapy: Secondary | ICD-10-CM | POA: Diagnosis not present

## 2021-11-28 DIAGNOSIS — G620 Drug-induced polyneuropathy: Secondary | ICD-10-CM

## 2021-11-28 DIAGNOSIS — T451X5A Adverse effect of antineoplastic and immunosuppressive drugs, initial encounter: Secondary | ICD-10-CM

## 2021-11-28 DIAGNOSIS — Z9481 Bone marrow transplant status: Secondary | ICD-10-CM

## 2021-11-28 DIAGNOSIS — C9000 Multiple myeloma not having achieved remission: Secondary | ICD-10-CM

## 2021-11-28 LAB — CBC WITH DIFFERENTIAL (CANCER CENTER ONLY)
Abs Immature Granulocytes: 0.01 10*3/uL (ref 0.00–0.07)
Basophils Absolute: 0 10*3/uL (ref 0.0–0.1)
Basophils Relative: 0 %
Eosinophils Absolute: 0.2 10*3/uL (ref 0.0–0.5)
Eosinophils Relative: 3 %
HCT: 33.7 % — ABNORMAL LOW (ref 39.0–52.0)
Hemoglobin: 11 g/dL — ABNORMAL LOW (ref 13.0–17.0)
Immature Granulocytes: 0 %
Lymphocytes Relative: 19 %
Lymphs Abs: 1.1 10*3/uL (ref 0.7–4.0)
MCH: 31.5 pg (ref 26.0–34.0)
MCHC: 32.6 g/dL (ref 30.0–36.0)
MCV: 96.6 fL (ref 80.0–100.0)
Monocytes Absolute: 1 10*3/uL (ref 0.1–1.0)
Monocytes Relative: 16 %
Neutro Abs: 3.8 10*3/uL (ref 1.7–7.7)
Neutrophils Relative %: 62 %
Platelet Count: 111 10*3/uL — ABNORMAL LOW (ref 150–400)
RBC: 3.49 MIL/uL — ABNORMAL LOW (ref 4.22–5.81)
RDW: 15.9 % — ABNORMAL HIGH (ref 11.5–15.5)
WBC Count: 6.1 10*3/uL (ref 4.0–10.5)
nRBC: 0 % (ref 0.0–0.2)

## 2021-11-28 MED ORDER — ONDANSETRON HCL 8 MG PO TABS
8.0000 mg | ORAL_TABLET | ORAL | 1 refills | Status: DC
Start: 2021-11-28 — End: 2022-02-09
  Filled 2021-11-28: qty 30, 10d supply, fill #0

## 2021-11-28 MED ORDER — BORTEZOMIB CHEMO SQ INJECTION 3.5 MG (2.5MG/ML)
1.3000 mg/m2 | Freq: Once | INTRAMUSCULAR | Status: AC
  Administered 2021-11-28: 2.5 mg via SUBCUTANEOUS
  Filled 2021-11-28: qty 1

## 2021-11-28 NOTE — Patient Instructions (Signed)
Andre [name]   Discharge Instructions: Thank you for choosing [facility] to provide your oncology and hematology care.   If you have a lab appointment with the [facility], please go directly to the [facility] and check in at the registration area.   Wear comfortable clothing and clothing appropriate for easy access to any Portacath or PICC line.   We strive to give you quality time with your provider. You may need to reschedule your appointment if you arrive late (15 or more minutes).  Arriving late affects you and other patients whose appointments are after yours.  Also, if you miss three or more appointments without notifying the office, you may be dismissed from the clinic at the providers discretion.      For prescription refill requests, have your pharmacy contact our office and allow 72 hours for refills to be completed.    Today you received the following chemotherapy and/or immunotherapy agents Bortezomib (VELCADE).      To help prevent nausea and vomiting after your treatment, we encourage you to take your nausea medication as directed.  BELOW ARE SYMPTOMS THAT SHOULD BE REPORTED IMMEDIATELY: *FEVER GREATER THAN 100.4 F (38 C) OR HIGHER *CHILLS OR SWEATING *NAUSEA AND VOMITING THAT IS NOT CONTROLLED WITH YOUR NAUSEA MEDICATION *UNUSUAL SHORTNESS OF BREATH *UNUSUAL BRUISING OR BLEEDING *URINARY PROBLEMS (pain or burning when urinating, or frequent urination) *BOWEL PROBLEMS (unusual diarrhea, constipation, pain near the anus) TENDERNESS IN MOUTH AND THROAT WITH OR WITHOUT PRESENCE OF ULCERS (sore throat, sores in mouth, or a toothache) UNUSUAL RASH, SWELLING OR PAIN  UNUSUAL VAGINAL DISCHARGE OR ITCHING   Items with * indicate a potential emergency and should be followed up as soon as possible or go to the Emergency Department if any problems should occur.  Please show the CHEMOTHERAPY ALERT CARD or IMMUNOTHERAPY ALERT CARD at  check-in to the Emergency Department and triage nurse.  Should you have questions after your visit or need to cancel or reschedule your appointment, please contact [facility]  Dept: [phone]  and follow the prompts.  Office hours are 8:00 a.m. to 4:30 p.m. Monday - Friday. Please note that voicemails left after 4:00 p.m. may not be returned until the following business day.  We are closed weekends and major holidays. You have access to a nurse at all times for urgent questions. Please call the main number to the clinic Dept: [phone] and follow the prompts.   For any non-urgent questions, you may also contact your provider using MyChart. We now offer e-Visits for anyone 45 and older to request care online for non-urgent symptoms. For details visit [email or web address].   Also download the MyChart app! Go to the app store, search "MyChart", open the app, select [facility], and log in with your MyChart username and password.  Due to Covid, a mask is required upon entering the hospital/clinic. If you do not have a mask, one will be given to you upon arrival. For doctor visits, patients may have 1 support person aged 71 or older with them. For treatment visits, patients cannot have anyone with them due to current Covid guidelines and our immunocompromised population.   Bortezomib injection What is this medication? BORTEZOMIB (bor TEZ oh mib) targets proteins in cancer cells and stops the cancer cells from growing. It treats multiple myeloma and mantle cell lymphoma. This medicine may be used for other purposes; ask your health care provider or pharmacist if  you have questions. COMMON BRAND NAME(S): Velcade What should I tell my care team before I take this medication? They need to know if you have any of these conditions: dehydration diabetes (high blood sugar) heart disease liver disease tingling of the fingers or toes or other nerve disorder an unusual or  allergic reaction to bortezomib, mannitol, boron, other medicines, foods, dyes, or preservatives pregnant or trying to get pregnant breast-feeding How should I use this medication? This medicine is injected into a vein or under the skin. It is given by a health care provider in a hospital or clinic setting. Talk to your health care provider about the use of this medicine in children. Special care may be needed. Overdosage: If you think you have taken too much of this medicine contact a poison control center or emergency room at once. NOTE: This medicine is only for you. Do not share this medicine with others. What if I miss a dose? Keep appointments for follow-up doses. It is important not to miss your dose. Call your health care provider if you are unable to keep an appointment. What may interact with this medication? This medicine may interact with the following medications: ketoconazole rifampin This list may not describe all possible interactions. Give your health care provider a list of all the medicines, herbs, non-prescription drugs, or dietary supplements you use. Also tell them if you smoke, drink alcohol, or use illegal drugs. Some items may interact with your medicine. What should I watch for while using this medication? Your condition will be monitored carefully while you are receiving this medicine. You may need blood work done while you are taking this medicine. You may get drowsy or dizzy. Do not drive, use machinery, or do anything that needs mental alertness until you know how this medicine affects you. Do not stand up or sit up quickly, especially if you are an older patient. This reduces the risk of dizzy or fainting spells This medicine may increase your risk of getting an infection. Call your health care provider for advice if you get a fever, chills, sore throat, or other symptoms of a cold or flu. Do not treat yourself. Try to avoid being around people who are sick. Check  with your health care provider if you have severe diarrhea, nausea, and vomiting, or if you sweat a lot. The loss of too much body fluid may make it dangerous for you to take this medicine. Do not become pregnant while taking this medicine or for 7 months after stopping it. Women should inform their health care provider if they wish to become pregnant or think they might be pregnant. Men should not father a child while taking this medicine and for 4 months after stopping it. There is a potential for serious harm to an unborn child. Talk to your health care provider for more information. Do not breast-feed an infant while taking this medicine or for 2 months after stopping it. This medicine may make it more difficult to get pregnant or father a child. Talk to your health care provider if you are concerned about your fertility. What side effects may I notice from receiving this medication? Side effects that you should report to your doctor or health care professional as soon as possible: allergic reactions (skin rash; itching or hives; swelling of the face, lips, or tongue) bleeding (bloody or black, tarry stools; red or dark brown urine; spitting up blood or brown material that looks like coffee grounds; red spots on the  skin; unusual bruising or bleeding from the eye, gums, or nose) blurred vision or changes in vision confusion constipation headache heart failure (trouble breathing; fast, irregular heartbeat; sudden weight gain; swelling of the ankles, feet, hands) infection (fever, chills, cough, sore throat, pain or trouble passing urine) lack or loss of appetite liver injury (dark yellow or brown urine; general ill feeling or flu-like symptoms; loss of appetite, right upper belly pain; yellowing of the eyes or skin) low blood pressure (dizziness; feeling faint or lightheaded, falls; unusually weak or tired) muscle cramps pain, redness, or irritation at site where injected pain, tingling,  numbness in the hands or feet seizures trouble breathing unusual bruising or bleeding Side effects that usually do not require medical attention (report to your doctor or health care professional if they continue or are bothersome): diarrhea nausea stomach pain trouble sleeping vomiting This list may not describe all possible side effects. Call your doctor for medical advice about side effects. You may report side effects to FDA at 1-800-FDA-1088. Where should I keep my medication? This medicine is given in a hospital or clinic. It will not be stored at home. NOTE: This sheet is a summary. It may not cover all possible information. If you have questions about this medicine, talk to your doctor, pharmacist, or health care provider.  2022 Elsevier/Gold Standard (2020-08-26 00:00:00)

## 2021-11-28 NOTE — Progress Notes (Signed)
Patient presents for treatment. RN assessment completed along with the following: ?  ?Labs/vitals reviewed - Yes, and within treatment parameters.   ?Weight within 10% of previous measurement - Yes ?Informed consent completed and reflects current therapy/intent - Yes, on date 01/12/2020             ?Provider progress note reviewed - Patient not seen by provider today. Most recent note dated 11/14/2021 reviewed. ?Treatment/Antibody/Supportive plan reviewed - Yes, and there are no adjustments needed for today's treatment. ?S&H and other orders reviewed - Yes, and there are no additional orders identified. ?Previous treatment date reviewed - Yes, and the appropriate amount of time has elapsed between treatments. ?Clinic Hand Off Received from - No. ?  ?Patient to proceed with treatment.  ?

## 2021-11-28 NOTE — Telephone Encounter (Signed)
Oral Oncology Patient Advocate Encounter ? ?After completing a benefits investigation, prior authorization for Cytoxan is not required at this time through Medicare [place]. ? ?Patient's copay is $10.18.   ? ? ?Wynn Maudlin CPHT ?Specialty Pharmacy Patient Advocate ?[facility] ?Phone [phone] ?Fax [phone] ?11/28/2021 10:51 AM ? ?  ? ?

## 2021-11-28 NOTE — Telephone Encounter (Signed)
Oral Chemotherapy Pharmacist Encounter ? ?[facility] will deliver medication to Andre [name] on 11/29/21. He will take his first dose on that day a Tuesday, then the following week return to his regularly scheduled Mondays. ? ?Patient Education ?I spoke with patient for overview of new oral chemotherapy medication: Cytoxan PO (cyclophosphamide) for the treatment of recurrent multiple myeloma in conjunction with daratumumab and bortezomib (no dexamethasone because patient is intolerant). Based on the [facility] study. Planned duration until disease control or unacceptable drug toxicity..  ? ?Pt is doing well. Counseled patient on administration, dosing, side effects, monitoring, drug-food interactions, safe handling, storage, and disposal. ?Patient will take 5 capsules (250 mg total) by mouth once a week. Take with food to minimize GI upset. Take early in the day and maintain hydration. ? ?Side effects include but not limited to: nausea, decreased wbc/hgb/plt. ?Nausea: due to moderate to high emetic risk for cyclophosphamide prescription for ondansetron set in for patient to take 30-61mns prior to his cyclopohsphamide ? ?Reviewed with patient importance of keeping a medication schedule and plan for any missed doses. ? ?After discussion with patient no patient barriers to medication adherence identified.  ? ?Andre [name] voiced understanding and appreciation. All questions answered. Medication handout provided. ? ?[facility]. Patient knows to call the office with questions or concerns. Oral Chemotherapy Navigation Clinic will continue to follow. ? ?ADarl Pikes PharmD, BCPS, BCOP, CPP ?Hematology/Oncology Clinical Pharmacist Practitioner ?[facility]/AP Oral Chemotherapy Navigation Clinic ?[phone]? ?11/28/2021 2:24 PM  ?

## 2021-11-28 NOTE — Telephone Encounter (Signed)
Oral Oncology Pharmacist Encounter ? ?Received new prescription for Cytoxan PO (cyclophosphamide) for the treatment of recurrent multiple myeloma in conjunction with daratumumab and bortezomib (no dexamethasone because patient is intolerant). Based on the [facility] study. Planned duration until disease control or unacceptable drug toxicity. ? ?CBC from 11/28/21 and CMP from 11/14/21 assessed, no relevant lab abnormalities. Prescription dose and frequency assessed. MD starting patient on a reduced dose.  ? ?Current medication list in Epic reviewed, no relevant DDIs with cyclophosphamide identified. ? ?Evaluated chart and no patient barriers to medication adherence identified.  ? ?Prescription has been e-scribed to the [facility] for benefits analysis and approval. ? ?Oral Oncology Clinic will continue to follow for insurance authorization, copayment issues, initial counseling and start date. ? ? ?Darl Pikes, PharmD, BCPS, BCOP, CPP ?Hematology/Oncology Clinical Pharmacist Practitioner ?[facility]/AP Oral Chemotherapy Navigation Clinic ?[phone] ? ?11/28/2021 10:28 AM ? ?

## 2021-11-29 ENCOUNTER — Other Ambulatory Visit (HOSPITAL_COMMUNITY): Payer: Self-pay

## 2021-11-29 ENCOUNTER — Other Ambulatory Visit: Payer: Self-pay | Admitting: Internal Medicine

## 2021-11-29 ENCOUNTER — Other Ambulatory Visit (HOSPITAL_BASED_OUTPATIENT_CLINIC_OR_DEPARTMENT_OTHER): Payer: Self-pay

## 2021-11-29 ENCOUNTER — Encounter: Payer: Self-pay | Admitting: Oncology

## 2021-11-29 MED ORDER — ESCITALOPRAM OXALATE 10 MG PO TABS
10.0000 mg | ORAL_TABLET | Freq: Every day | ORAL | 1 refills | Status: DC
  Filled 2021-11-29: qty 90, 90d supply, fill #0

## 2021-11-29 MED FILL — Pantoprazole Sodium EC Tab 40 MG (Base Equiv): ORAL | 90 days supply | Qty: 180 | Fill #2 | Status: AC

## 2021-11-29 NOTE — Telephone Encounter (Signed)
Patient is requesting a refill of his Lexapro.. Last refill stated" Patient couldn't get refills unless Jan appt is keep. Patient has a f/u appt on 02/2022 is it ok to fill ?  ? ?Please advise ?

## 2021-12-04 ENCOUNTER — Encounter: Payer: Self-pay | Admitting: Oncology

## 2021-12-11 ENCOUNTER — Other Ambulatory Visit: Payer: Self-pay | Admitting: Oncology

## 2021-12-12 ENCOUNTER — Other Ambulatory Visit (HOSPITAL_BASED_OUTPATIENT_CLINIC_OR_DEPARTMENT_OTHER): Payer: Self-pay

## 2021-12-12 ENCOUNTER — Inpatient Hospital Stay (HOSPITAL_BASED_OUTPATIENT_CLINIC_OR_DEPARTMENT_OTHER): Payer: Medicare Other | Admitting: Oncology

## 2021-12-12 ENCOUNTER — Encounter: Payer: Self-pay | Admitting: *Deleted

## 2021-12-12 ENCOUNTER — Other Ambulatory Visit: Payer: Self-pay | Admitting: *Deleted

## 2021-12-12 ENCOUNTER — Inpatient Hospital Stay: Payer: Medicare Other

## 2021-12-12 ENCOUNTER — Other Ambulatory Visit: Payer: Self-pay

## 2021-12-12 VITALS — BP 140/72 | HR 91 | Temp 97.8°F | Resp 18 | Ht 69.0 in | Wt 181.2 lb

## 2021-12-12 DIAGNOSIS — Z5112 Encounter for antineoplastic immunotherapy: Secondary | ICD-10-CM | POA: Diagnosis not present

## 2021-12-12 DIAGNOSIS — C9000 Multiple myeloma not having achieved remission: Secondary | ICD-10-CM

## 2021-12-12 DIAGNOSIS — Z5111 Encounter for antineoplastic chemotherapy: Secondary | ICD-10-CM | POA: Diagnosis not present

## 2021-12-12 DIAGNOSIS — I10 Essential (primary) hypertension: Secondary | ICD-10-CM

## 2021-12-12 LAB — CMP (CANCER CENTER ONLY)
ALT: 13 U/L (ref 0–44)
AST: 20 U/L (ref 15–41)
Albumin: 4.3 g/dL (ref 3.5–5.0)
Alkaline Phosphatase: 54 U/L (ref 38–126)
Anion gap: 7 (ref 5–15)
BUN: 26 mg/dL — ABNORMAL HIGH (ref 8–23)
CO2: 24 mmol/L (ref 22–32)
Calcium: 10 mg/dL (ref 8.9–10.3)
Chloride: 100 mmol/L (ref 98–111)
Creatinine: 1.11 mg/dL (ref 0.61–1.24)
GFR, Estimated: >60 mL/min (ref >60)
Glucose, Bld: 83 mg/dL (ref 70–99)
Potassium: 4.4 mmol/L (ref 3.5–5.1)
Sodium: 131 mmol/L — ABNORMAL LOW (ref 135–145)
Total Bilirubin: 0.5 mg/dL (ref 0.3–1.2)
Total Protein: 7.8 g/dL (ref 6.5–8.1)

## 2021-12-12 LAB — CBC WITH DIFFERENTIAL (CANCER CENTER ONLY)
Abs Immature Granulocytes: 0.02 10*3/uL (ref 0.00–0.07)
Basophils Absolute: 0 10*3/uL (ref 0.0–0.1)
Basophils Relative: 0 %
Eosinophils Absolute: 0.1 10*3/uL (ref 0.0–0.5)
Eosinophils Relative: 2 %
HCT: 31.7 % — ABNORMAL LOW (ref 39.0–52.0)
Hemoglobin: 10.4 g/dL — ABNORMAL LOW (ref 13.0–17.0)
Immature Granulocytes: 0 %
Lymphocytes Relative: 14 %
Lymphs Abs: 1.1 10*3/uL (ref 0.7–4.0)
MCH: 31.5 pg (ref 26.0–34.0)
MCHC: 32.8 g/dL (ref 30.0–36.0)
MCV: 96.1 fL (ref 80.0–100.0)
Monocytes Absolute: 1.1 10*3/uL — ABNORMAL HIGH (ref 0.1–1.0)
Monocytes Relative: 15 %
Neutro Abs: 5.4 10*3/uL (ref 1.7–7.7)
Neutrophils Relative %: 69 %
Platelet Count: 117 10*3/uL — ABNORMAL LOW (ref 150–400)
RBC: 3.3 MIL/uL — ABNORMAL LOW (ref 4.22–5.81)
RDW: 16.4 % — ABNORMAL HIGH (ref 11.5–15.5)
WBC Count: 7.8 10*3/uL (ref 4.0–10.5)
nRBC: 0 % (ref 0.0–0.2)

## 2021-12-12 NOTE — Progress Notes (Signed)
ON PATHWAY REGIMEN - Multiple Myeloma and Other Plasma Cell Dyscrasias ? ?No Change  Continue With Treatment as Ordered. ? ?Original Decision Date/Time: 12/14/2020 12:06 ? ? ?  Cycles 1 through 3: A cycle is every 21 days: ?    Dexamethasone  ?    Bortezomib  ?    Daratumumab  ?  Cycles 4 through 8: A cycle is every 21 days: ?    Dexamethasone  ?    Bortezomib  ?    Daratumumab  ?  Cycles 9 and beyond: A cycle is every 28 days: ?    Daratumumab  ? ?**Always confirm dose/schedule in your pharmacy ordering system** ? ?Patient Characteristics: ?Multiple Myeloma, Relapsed / Refractory, Second through Fourth Lines of Therapy, Fit or Candidate for Triplet Therapy, Lenalidomide-Refractory or Lenalidomide-based Regimen Not Preferred, Candidate for Anti-CD38 Antibody ?Disease Classification: Multiple Myeloma ?R-ISS Staging: III ?Therapeutic Status: Relapsed ?Line of Therapy: Third Line ?Anti-CD38 Antibody Candidacy: Candidate for Anti-CD38 Antibody ?Lenalidomide-based Regimen Preference/Candidacy: Lenalidomide-based Regimen Not Preferred ?Intent of Therapy: ?Non-Curative / Palliative Intent, Discussed with Patient ?

## 2021-12-12 NOTE — Progress Notes (Signed)
?[facility] ?OFFICE PROGRESS NOTE ? ? ?Diagnosis: Multiple myeloma ? ?INTERVAL HISTORY:  ? ? Andre [name] returns as scheduled.  He began weekly Velcade 11/14/2021.  He began weekly oral Cytoxan 11/29/2021.  He took a dose of Cytoxan today.  He reports "congestion "for the past few weeks.  No fever.  Stable dyspnea.  He exercises 4 to 5 days/week. ?He saw Dr.Gasparetto 12/05/2021.  She recommends changing treatment to Decadron, carfilzomib, and Cytoxan.  She will consider treating him with CAR-T therapy. ? ?Objective: ? ?Vital signs in last 24 hours: ? ?Blood pressure 140/72, pulse 91, temperature 97.8 ?F (36.6 ?C), temperature source Oral, resp. rate 18, height _0  (1.753 m), weight 181 lb 3.2 oz (82.2 kg), SpO2 98 %. ?  ? ?HEENT: No thrush or ulcers ?Resp: Scattered end inspiratory rhonchi/wheeze, no respiratory distress ?Cardio: Regular rate and rhythm ?GI: No hepatosplenomegaly ?Vascular: No leg edema ? ? ?Lab Results: ? ?Lab Results  ?Component Value Date  ? WBC 7.8 12/12/2021  ? HGB 10.4 (L) 12/12/2021  ? HCT 31.7 (L) 12/12/2021  ? MCV 96.1 12/12/2021  ? PLT 117 (L) 12/12/2021  ? NEUTROABS 5.4 12/12/2021  ? ? ?CMP  ?Lab Results  ?Component Value Date  ? NA 137 11/14/2021  ? K 4.2 11/14/2021  ? CL 104 11/14/2021  ? CO2 25 11/14/2021  ? GLUCOSE 80 11/14/2021  ? BUN 26 (H) 11/14/2021  ? CREATININE 1.08 11/14/2021  ? CALCIUM 10.3 11/14/2021  ? PROT 7.7 11/14/2021  ? ALBUMIN 4.2 11/14/2021  ? AST 20 11/14/2021  ? ALT 12 11/14/2021  ? ALKPHOS 42 11/14/2021  ? BILITOT 0.4 11/14/2021  ? GFRNONAA >60 11/14/2021  ? GFRAA >60 06/15/2020  ? ? ? ?Medications: I have reviewed the patient's current medications. ? ? ?Assessment/Plan: ?Multiple myeloma- IgG lambda ? bortezomib (subcutaneously), lenalidomide, and dexamethasone, with repeat bone marrow biopsy May of 2012 showing 10% plasmacytosis ?  ?(2) high-dose chemotherapy with BCNU and melphalan at [facility], followed by stem cell rescue July of 2012 ?  ?(3) on  zoledronic acid started December of 2012, initially monthly, currently given every 3 months, most recent dose  12/07/2015 ?  ?(4) low-dose lenalidomide resumed April 2013, interrupted several times.  Resumed again on 02/19/2013, ata dose of 5 mg daily, 21 days on and 7 days off, later further reduced to 7 days on, 7 days off ?  ?(5) CNS symptoms and abnormal brain MRI extensively evaluated by neurology with no definitive diagnosis established ?  ?(6) rising M spike noted June 2014 but did not meet criteria for carfilzomib study ?  ?(7) on lenalidomide 25 mg daily, 14 days on, 7 days off, starting 04/18/2013, interrupted December 2014 because of rash; ?            (a) resumed January 2015 at 10 mg/ day at 21 days on/ 7 days off ?            (Andre [name]) starting 08/18/2014 decreased to 10 mg/ day 14 days on, 7 days off because of cytopenias ?            (c) as of February 2016 was on 5 mg daily 7 days on 7 days off ?            (d) lenalidomide discontinued December 2016 with evidence of disease progression ?  ?(8) transient global amnesia 05/29/2015, resolved without intervention ?  ?(9) starting PVD 10/25/2015 w ASA 325 thromboprophylaxis, valacyclovir prophylaxis, last dose 12/17/2015 ?            (  a) pomalidomide 4 mg/d days 1-14 ?            (Andre [name]) bortezomib sQ days 2,5,9,12 of each 21 day cycle ?            (c) dexamethasome 20 mg two days a week ?            (d) dexamethasone bortezomib and pomalidomide discontinued late December 2018 with poor tolerance ?  ?(10) metapneumovirus pneumonia April 2017 ?            (a) completing course of steroids and week of bactrim mid April 2017 ?  ?(11) status post second autologous transplant at [facility] 02/04/2016(preparatory regimen melphalan 200 mg/m?) ?            (a) received twelve-month vaccinations 03/14/2017 (DPT, Haemophilus, Pneumovax 13, polio) ?            (Andre [name]) 14 months injections 05/04/2016 include DTaP, Hib conjugate, HepB energex Andre [name] 20 mcg/ml, Prevnar 13 ?            (c) 24  month vaccines due at [facility] June 2019 ?  ?(12) maintenance therapy started November 2017, consisting of ?            (a) bortezomib 1.3 mg/M2 every 14 days, first dose 07/27/2016 ?            (Andre [name]) pomalidomide 1 mg days 1-21 Q28 days, started 07/19/2016 ?            (c) zolendronate monthly started 07/27/2016 (previously Q 3 months) however patient unable to tolerate, and changed back to q3 months in April, 2018 ?            (d) Bortezomib changed to monthly as of June 2018 because of tolerance issues, however discontinued after 09/14/2017 dose because of a rise in his M spike ?            (e) pomalidomide held after 10/18/2017 in preparation for possible study at [place] (venetoclax)--never resumed ?            (f) with numbers actually improved off treatment, resumed every 2-week bortezomib 12/25/2017 ?            (g) changed to every 3-week bortezomib as of 03/17/2019 ?            (h) briefly on weekly treatments times 22 October 2020 due to increase in M spike ?            (I) maintenance therapy discontinued with evidence of progression ?  ?(13) bortezomib/daratumumab/dexamethasone started 12/20/2020. ?            (a) Decadron dose dropped to 10 mg day of and day following treatment starting May 2022 ?            (Andre [name]) day 8 bortezomib omitted beginning with the June cycle per patient preference ?            (C) treatment changed to monthly daratumumab/Velcade/Decadron beginning 06/27/2021-patient decision ?(14) weekly Velcade/Cytoxan beginning 11/14/2021 ?(15) changed to Cytoxan/carfilzomib/Decadron for 323 ?  ? ? ? ? ? ?Disposition: ?Andre Manrique appears stable.  He saw Dr. Pat Kocher last week.  She recommends changing treatment to Cytoxan/carfilzomib/Decadron.  The plan is to begin treatment on 12/19/2021. ? ?He will return for an office visit on 01/02/2022. ? ?We reviewed potential toxicities associated with carfilzomib including the chance of cardiac toxicity, hypertension, and fluid retention.  He agrees to proceed.   We will refer him  for a baseline echocardiogram. ? ?Andre Brawley reports upper airway congestion.  This may be related to seasonal allergies or a viral upper respiratory infection.  He will call for dyspnea or fever. ? ? ? ?Betsy Coder, MD ? ?12/12/2021  ?10:26 AM ? ? ?

## 2021-12-12 NOTE — Progress Notes (Signed)
DISCONTINUE ON PATHWAY REGIMEN - Multiple Myeloma and Other Plasma Cell Dyscrasias ? ? ?  Cycles 1 through 3: A cycle is every 21 days: ?    Dexamethasone  ?    Bortezomib  ?    Daratumumab  ?  Cycles 4 through 8: A cycle is every 21 days: ?    Dexamethasone  ?    Bortezomib  ?    Daratumumab  ?  Cycles 9 and beyond: A cycle is every 28 days: ?    Daratumumab  ? ?**Always confirm dose/schedule in your pharmacy ordering system** ? ?REASON: Disease Progression ?PRIOR TREATMENT: [id number]: DaraVd (Daratumumab 16 mg/kg IV + Bortezomib 1.3 mg/m2 SUBQ D1,4,8,11 + Dexamethasone 20 mg) Until Progression or Unacceptable Toxicity ?TREATMENT RESPONSE: Progressive Disease (PD) ? ?START OFF PATHWAY REGIMEN - Multiple Myeloma and Other Plasma Cell Dyscrasias ? ? ?[id number]:KCd (Carfilzomib 20/36 mg/m2 IV + Cyclophosphamide 500 mg PO + Dexamethasone 40 mg PO) q28 Days x 6 Cycles: ?  Cycle 1: A cycle is 28 days: ?    Cyclophosphamide  ?    Dexamethasone  ?    Carfilzomib  ?    Carfilzomib  ?  Cycles 2 through 6: A cycle is every 28 days: ?    Cyclophosphamide  ?    Dexamethasone  ?    Carfilzomib  ? ?**Always confirm dose/schedule in your pharmacy ordering system** ? ?Patient Characteristics: ?Multiple Myeloma, Relapsed / Refractory, Second through [employer] of Therapy, Fit or Candidate for Triplet Therapy, Lenalidomide-Refractory or Lenalidomide-based Regimen Not Preferred, Not a Candidate for Anti-CD38 Antibody ?Disease Classification: Multiple Myeloma ?R-ISS Staging: III ?Therapeutic Status: Relapsed ?Line of Therapy: Fourth Line ?Anti-CD38 Antibody Candidacy: Not a Candidate for Anti-CD38 Antibody ?Lenalidomide-based Regimen Preference/Candidacy: Lenalidomide-based Regimen Not Preferred ?Intent of Therapy: ?Non-Curative / Palliative Intent, Discussed with Patient ?

## 2021-12-12 NOTE — Progress Notes (Signed)
Patient seen by Dr. Benay Spice today ? ?Vitals are within treatment parameters. ? ?Labs reviewed by Dr. Benay Spice and are within treatment parameters. ? ?Per physician team,  Per visit w/Dr.  Alvie Heidelberg at [facility]--recommend to discontinue current regimen and change to carfilzomib, decadron, and cytoxan. ?Cancel treatment today. ?

## 2021-12-13 ENCOUNTER — Telehealth: Payer: Self-pay | Admitting: *Deleted

## 2021-12-13 ENCOUNTER — Other Ambulatory Visit (HOSPITAL_COMMUNITY): Payer: Self-pay

## 2021-12-13 LAB — KAPPA/LAMBDA LIGHT CHAINS
Kappa free light chain: 8.3 mg/L (ref 3.3–19.4)
Kappa, lambda light chain ratio: 0.06 — ABNORMAL LOW (ref 0.26–1.65)
Lambda free light chains: 135.9 mg/L — ABNORMAL HIGH (ref 5.7–26.3)

## 2021-12-13 NOTE — Telephone Encounter (Signed)
Notified Andre [name] of ECHO at next door cardiology practice on 12/26/21 w/arrival at 0845 for 0900 test. ?

## 2021-12-14 LAB — PROTEIN ELECTROPHORESIS, SERUM
A/G Ratio: 1.1 (ref 0.7–1.7)
Albumin ELP: 3.8 g/dL (ref 2.9–4.4)
Alpha-1-Globulin: 0.2 g/dL (ref 0.0–0.4)
Alpha-2-Globulin: 0.9 g/dL (ref 0.4–1.0)
Beta Globulin: 1.1 g/dL (ref 0.7–1.3)
Gamma Globulin: 1.4 g/dL (ref 0.4–1.8)
Globulin, Total: 3.6 g/dL (ref 2.2–3.9)
M-Spike, %: 1.2 g/dL — ABNORMAL HIGH
Total Protein ELP: 7.4 g/dL (ref 6.0–8.5)

## 2021-12-17 ENCOUNTER — Other Ambulatory Visit: Payer: Self-pay | Admitting: Oncology

## 2021-12-19 ENCOUNTER — Inpatient Hospital Stay: Payer: Medicare Other | Attending: Oncology

## 2021-12-19 ENCOUNTER — Encounter: Payer: Self-pay | Admitting: *Deleted

## 2021-12-19 ENCOUNTER — Inpatient Hospital Stay: Payer: Medicare Other

## 2021-12-19 VITALS — BP 140/62 | HR 61 | Temp 98.0°F | Resp 18 | Ht 69.0 in | Wt 179.6 lb

## 2021-12-19 DIAGNOSIS — Z5111 Encounter for antineoplastic chemotherapy: Secondary | ICD-10-CM | POA: Insufficient documentation

## 2021-12-19 DIAGNOSIS — C9000 Multiple myeloma not having achieved remission: Secondary | ICD-10-CM

## 2021-12-19 DIAGNOSIS — Z79899 Other long term (current) drug therapy: Secondary | ICD-10-CM | POA: Insufficient documentation

## 2021-12-19 DIAGNOSIS — Z9481 Bone marrow transplant status: Secondary | ICD-10-CM

## 2021-12-19 DIAGNOSIS — Z5112 Encounter for antineoplastic immunotherapy: Secondary | ICD-10-CM | POA: Diagnosis present

## 2021-12-19 DIAGNOSIS — G620 Drug-induced polyneuropathy: Secondary | ICD-10-CM

## 2021-12-19 DIAGNOSIS — R110 Nausea: Secondary | ICD-10-CM | POA: Insufficient documentation

## 2021-12-19 LAB — CBC WITH DIFFERENTIAL (CANCER CENTER ONLY)
Abs Immature Granulocytes: 0.06 10*3/uL (ref 0.00–0.07)
Basophils Absolute: 0 10*3/uL (ref 0.0–0.1)
Basophils Relative: 1 %
Eosinophils Absolute: 0.1 10*3/uL (ref 0.0–0.5)
Eosinophils Relative: 3 %
HCT: 32.3 % — ABNORMAL LOW (ref 39.0–52.0)
Hemoglobin: 10.5 g/dL — ABNORMAL LOW (ref 13.0–17.0)
Immature Granulocytes: 1 %
Lymphocytes Relative: 17 %
Lymphs Abs: 0.9 10*3/uL (ref 0.7–4.0)
MCH: 31.8 pg (ref 26.0–34.0)
MCHC: 32.5 g/dL (ref 30.0–36.0)
MCV: 97.9 fL (ref 80.0–100.0)
Monocytes Absolute: 0.9 10*3/uL (ref 0.1–1.0)
Monocytes Relative: 16 %
Neutro Abs: 3.4 10*3/uL (ref 1.7–7.7)
Neutrophils Relative %: 62 %
Platelet Count: 114 10*3/uL — ABNORMAL LOW (ref 150–400)
RBC: 3.3 MIL/uL — ABNORMAL LOW (ref 4.22–5.81)
RDW: 16.8 % — ABNORMAL HIGH (ref 11.5–15.5)
WBC Count: 5.3 10*3/uL (ref 4.0–10.5)
nRBC: 0 % (ref 0.0–0.2)

## 2021-12-19 LAB — TROPONIN I (HIGH SENSITIVITY): Troponin I (High Sensitivity): 4 ng/L (ref <18)

## 2021-12-19 MED ORDER — DEXTROSE 5 % IV SOLN
20.0000 mg/m2 | Freq: Once | INTRAVENOUS | Status: AC
  Administered 2021-12-19: 40 mg via INTRAVENOUS
  Filled 2021-12-19: qty 5

## 2021-12-19 MED ORDER — DEXAMETHASONE 4 MG PO TABS
10.0000 mg | ORAL_TABLET | Freq: Once | ORAL | Status: AC
  Administered 2021-12-19: 10 mg via ORAL
  Filled 2021-12-19: qty 3

## 2021-12-19 MED ORDER — ZOLEDRONIC ACID 4 MG/100ML IV SOLN
4.0000 mg | Freq: Once | INTRAVENOUS | Status: DC
  Filled 2021-12-19: qty 100

## 2021-12-19 MED ORDER — SODIUM CHLORIDE 0.9 % IV SOLN: Freq: Once | INTRAVENOUS | Status: AC

## 2021-12-19 MED ORDER — SODIUM CHLORIDE 0.9 % IV SOLN
250.0000 mg/m2 | Freq: Once | INTRAVENOUS | Status: AC
  Administered 2021-12-19: 500 mg via INTRAVENOUS
  Filled 2021-12-19: qty 25

## 2021-12-19 MED ORDER — SODIUM CHLORIDE 0.9 % IV SOLN: Freq: Once | INTRAVENOUS | Status: DC

## 2021-12-19 NOTE — Progress Notes (Signed)
Pharmacist Chemotherapy Monitoring - Initial Assessment   ? ?Anticipated start date: 12/19/21  ? ?The following has been reviewed per standard work regarding the patient's treatment regimen: ?[name] diagnosis, treatment plan and drug doses, and organ/hematologic function ?Lab orders and baseline tests specific to treatment regimen  ?The treatment plan start date, drug sequencing, and pre-medications ?Prior authorization status  ?Patient's documented medication list, including drug-drug interaction screen and prescriptions for anti-emetics and supportive care specific to the treatment regimen ?The drug concentrations, fluid compatibility, administration routes, and timing of the medications to be used ?[name] access for treatment and lifetime cumulative dose history, if applicable  ?[name] medication allergies and previous infusion related reactions, if applicable  ? ?Changes made to treatment plan:  ?N/A ? ?Follow up needed:  ?N/A ? ? ?Andre [name], RPH, ?12/19/2021  11:15 AM  ?

## 2021-12-19 NOTE — Progress Notes (Signed)
OK to treat today C1 D1 Kyprolis & Cytoxan with CMP from 12/12/21--per MD. ?

## 2021-12-19 NOTE — Progress Notes (Signed)
Patient presents for treatment. RN assessment completed along with the following: ? ?Labs/vitals reviewed - Yes, and Per Dr. Benay Spice, ok to treat with CMP from 12/12/21    ?Weight within 10% of previous measurement -Yes  ?Informed consent completed and reflects current therapy/intent - Yes, on date 12/19/21             ?Provider progress note reviewed - Patient not seen by provider today. Most recent note dated 12/12/21 reviewed. ?Treatment/Antibody/Supportive plan reviewed - Yes and patient is due for Zometa today.  Patient requested to receive zometa during next visit. Dr. Benay Spice made aware. [place] for patient to receive dose during next visit.  ?S&H and other orders reviewed - Yes, and there are no additional orders identified. ?Previous treatment date reviewed -  First time patient will be receiving treatment.  ? ? ?Patient to proceed with treatment.   ?

## 2021-12-19 NOTE — Patient Instructions (Signed)
[facility]  Discharge Instructions: ?Thank you for choosing [facility] to provide your oncology and hematology care.  ? ?If you have a lab appointment with the [facility], please go directly to the [facility] and check in at the registration area. ?  ?Wear comfortable clothing and clothing appropriate for easy access to any Portacath or PICC line.  ? ?We strive to give you quality time with your provider. You may need to reschedule your appointment if you arrive late (15 or more minutes).  Arriving late affects you and other patients whose appointments are after yours.  Also, if you miss three or more appointments without notifying the office, you may be dismissed from the clinic at the provider?s discretion.    ?  ?For prescription refill requests, have your pharmacy contact our office and allow 72 hours for refills to be completed.   ? ?Today you received the following chemotherapy and/or immunotherapy agents: carfilzomib, cyclophosphamide.  ? ?Carfilzomib injection ?What is this medication? ?CARFILZOMIB (kar FILZ oh mib) targets a specific protein within cancer cells and stops the cancer cells from growing. It is used to treat multiple myeloma. ?This medicine may be used for other purposes; ask your health care provider or pharmacist if you have questions. ?COMMON BRAND NAME(S): KYPROLIS ?What should I tell my care team before I take this medication? ?They need to know if you have any of these conditions: ?heart disease ?history of blood clots ?irregular heartbeat ?kidney disease ?liver disease ?lung or breathing disease ?an unusual or allergic reaction to carfilzomib, or other medicines, foods, dyes, or preservatives ?pregnant or trying to get pregnant ?breast-feeding ?How should I use this medication? ?This medicine is for injection or infusion into a vein. It is given by a health care professional in a hospital or clinic setting. ?Talk to your pediatrician  regarding the use of this medicine in children. Special care may be needed. ?Overdosage: If you think you have taken too much of this medicine contact a poison control center or emergency room at once. ?NOTE: This medicine is only for you. Do not share this medicine with others. ?What if I miss a dose? ?It is important not to miss your dose. Call your doctor or health care professional if you are unable to keep an appointment. ?What may interact with this medication? ?Interactions are not expected. ?This list may not describe all possible interactions. Give your health care provider a list of all the medicines, herbs, non-prescription drugs, or dietary supplements you use. Also tell them if you smoke, drink alcohol, or use illegal drugs. Some items may interact with your medicine. ?What should I watch for while using this medication? ?Your condition will be monitored while you are receiving this medicine. ?You may need blood work done while you are taking this medicine. ?Do not become pregnant while taking this medicine or for 6 months after stopping it. Women should inform their health care provider if they wish to become pregnant or think they might be pregnant. Men should not father a child while taking this medicine and for 3 months after stopping it. There is a potential for serious side effects to an unborn child. Talk to your health care provider for more information. Do not breast-feed an infant while taking this medicine or for 2 weeks after stopping it. ?Check with your health care provider if you have severe diarrhea, nausea, and vomiting, or if you sweat a lot. The loss of too much  body fluid may make it dangerous for you to take this medicine. ?You may get drowsy or dizzy. Do not drive, use machinery, or do anything that needs mental alertness until you know how this medicine affects you. Do not stand up or sit up quickly, especially if you are an older patient. This reduces the risk of dizzy or  fainting spells. ?What side effects may I notice from receiving this medication? ?Side effects that you should report to your doctor or health care professional as soon as possible: ?allergic reactions like skin rash, itching or hives, swelling of the face, lips, or tongue ?confusion ?dizziness ?feeling faint or lightheaded ?fever or chills ?palpitations ?seizures ?signs and symptoms of bleeding such as bloody or black, tarry stools; red or dark-brown urine; spitting up blood or brown material that looks like coffee grounds; red spots on the skin; unusual bruising or bleeding including from the eye, gums, or nose ?signs and symptoms of a blood clot such as breathing problems; changes in vision; chest pain; severe, sudden headache; pain, swelling, warmth in the leg; trouble speaking; sudden numbness or weakness of the face, arm or leg ?signs and symptoms of kidney injury like trouble passing urine or change in the amount of urine ?signs and symptoms of liver injury like dark yellow or brown urine; general ill feeling or flu-like symptoms; light-colored stools; loss of appetite; nausea; right upper belly pain; unusually weak or tired; yellowing of the eyes or skin ?Side effects that usually do not require medical attention (report to your doctor or health care professional if they continue or are bothersome): ?back pain ?cough ?diarrhea ?headache ?muscle cramps ?trouble sleeping ?vomiting ?This list may not describe all possible side effects. Call your doctor for medical advice about side effects. You may report side effects to FDA at 1-800-FDA-1088. ?Where should I keep my medication? ?This drug is given in a hospital or clinic and will not be stored at home. ?NOTE: This sheet is a summary. It may not cover all possible information. If you have questions about this medicine, talk to your doctor, pharmacist, or health care provider. ?? 2022 Elsevier/Gold Standard (2020-10-14 00:00:00) ?    ?Cyclophosphamide  Injection ?What is this medication? ?CYCLOPHOSPHAMIDE (sye kloe FOSS fa mide) is a chemotherapy drug. It slows the growth of cancer cells. This medicine is used to treat many types of cancer like lymphoma, myeloma, leukemia, breast cancer, and ovarian cancer, to name a few. ?This medicine may be used for other purposes; ask your health care provider or pharmacist if you have questions. ?COMMON BRAND NAME(S): Cytoxan, Neosar ?What should I tell my care team before I take this medication? ?They need to know if you have any of these conditions: ?heart disease ?history of irregular heartbeat ?infection ?kidney disease ?liver disease ?low blood counts, like white cells, platelets, or red blood cells ?on hemodialysis ?recent or ongoing radiation therapy ?scarring or thickening of the lungs ?trouble passing urine ?an unusual or allergic reaction to cyclophosphamide, other medicines, foods, dyes, or preservatives ?pregnant or trying to get pregnant ?breast-feeding ?How should I use this medication? ?This drug is usually given as an injection into a vein or muscle or by infusion into a vein. It is administered in a hospital or clinic by a specially trained health care professional. ?Talk to your pediatrician regarding the use of this medicine in children. Special care may be needed. ?Overdosage: If you think you have taken too much of this medicine contact a poison control center or emergency  room at once. ?NOTE: This medicine is only for you. Do not share this medicine with others. ?What if I miss a dose? ?It is important not to miss your dose. Call your doctor or health care professional if you are unable to keep an appointment. ?What may interact with this medication? ?amphotericin Mena ?azathioprine ?certain antivirals for HIV or hepatitis ?certain medicines for blood pressure, heart disease, irregular heart beat ?certain medicines that treat or prevent blood clots like warfarin ?certain other medicines for  cancer ?cyclosporine ?etanercept ?indomethacin ?medicines that relax muscles for surgery ?medicines to increase blood counts ?metronidazole ?This list may not describe all possible interactions. Give your health care provider a list of a

## 2021-12-20 ENCOUNTER — Telehealth: Payer: Self-pay

## 2021-12-20 NOTE — Telephone Encounter (Signed)
Called patient for first time chemo follow-up and left voice mail instructing patient with any questions or concerns.  Reminded patient of upcoming appointment.  ?

## 2021-12-22 ENCOUNTER — Other Ambulatory Visit (HOSPITAL_BASED_OUTPATIENT_CLINIC_OR_DEPARTMENT_OTHER): Payer: Medicare Other

## 2021-12-24 ENCOUNTER — Other Ambulatory Visit: Payer: Self-pay | Admitting: Oncology

## 2021-12-26 ENCOUNTER — Inpatient Hospital Stay: Payer: Medicare Other

## 2021-12-26 ENCOUNTER — Other Ambulatory Visit: Payer: Self-pay

## 2021-12-26 ENCOUNTER — Ambulatory Visit (INDEPENDENT_AMBULATORY_CARE_PROVIDER_SITE_OTHER): Payer: Medicare Other

## 2021-12-26 VITALS — BP 129/71 | HR 66 | Temp 97.5°F | Resp 18

## 2021-12-26 DIAGNOSIS — C9000 Multiple myeloma not having achieved remission: Secondary | ICD-10-CM

## 2021-12-26 DIAGNOSIS — Z9481 Bone marrow transplant status: Secondary | ICD-10-CM

## 2021-12-26 DIAGNOSIS — Z5112 Encounter for antineoplastic immunotherapy: Secondary | ICD-10-CM | POA: Diagnosis not present

## 2021-12-26 DIAGNOSIS — Z0189 Encounter for other specified special examinations: Secondary | ICD-10-CM

## 2021-12-26 DIAGNOSIS — I10 Essential (primary) hypertension: Secondary | ICD-10-CM | POA: Diagnosis not present

## 2021-12-26 DIAGNOSIS — G620 Drug-induced polyneuropathy: Secondary | ICD-10-CM

## 2021-12-26 DIAGNOSIS — E222 Syndrome of inappropriate secretion of antidiuretic hormone: Secondary | ICD-10-CM

## 2021-12-26 LAB — CBC WITH DIFFERENTIAL (CANCER CENTER ONLY)
Abs Immature Granulocytes: 0.01 10*3/uL (ref 0.00–0.07)
Basophils Absolute: 0 10*3/uL (ref 0.0–0.1)
Basophils Relative: 0 %
Eosinophils Absolute: 0.1 10*3/uL (ref 0.0–0.5)
Eosinophils Relative: 2 %
HCT: 32.7 % — ABNORMAL LOW (ref 39.0–52.0)
Hemoglobin: 10.7 g/dL — ABNORMAL LOW (ref 13.0–17.0)
Immature Granulocytes: 0 %
Lymphocytes Relative: 15 %
Lymphs Abs: 0.8 10*3/uL (ref 0.7–4.0)
MCH: 31.8 pg (ref 26.0–34.0)
MCHC: 32.7 g/dL (ref 30.0–36.0)
MCV: 97.3 fL (ref 80.0–100.0)
Monocytes Absolute: 1.1 10*3/uL — ABNORMAL HIGH (ref 0.1–1.0)
Monocytes Relative: 19 %
Neutro Abs: 3.6 10*3/uL (ref 1.7–7.7)
Neutrophils Relative %: 64 %
Platelet Count: 118 10*3/uL — ABNORMAL LOW (ref 150–400)
RBC: 3.36 MIL/uL — ABNORMAL LOW (ref 4.22–5.81)
RDW: 16.9 % — ABNORMAL HIGH (ref 11.5–15.5)
WBC Count: 5.7 10*3/uL (ref 4.0–10.5)
nRBC: 0 % (ref 0.0–0.2)

## 2021-12-26 LAB — ECHOCARDIOGRAM LIMITED
AR max vel: 2.39 cm2
AV Area VTI: 2.54 cm2
AV Area mean vel: 2.27 cm2
AV Mean grad: 6 mmHg
AV Peak grad: 10.5 mmHg
AV Vena cont: 0.88 cm
Ao pk vel: 1.62 m/s
Area-P 1/2: 2.26 cm2
Calc EF: 53.5 %
S' Lateral: 3.59 cm
Single Plane A2C EF: 43.6 %
Single Plane A4C EF: 59.2 %

## 2021-12-26 LAB — CMP (CANCER CENTER ONLY)
ALT: 13 U/L (ref 0–44)
AST: 20 U/L (ref 15–41)
Albumin: 4.2 g/dL (ref 3.5–5.0)
Alkaline Phosphatase: 47 U/L (ref 38–126)
Anion gap: 6 (ref 5–15)
BUN: 38 mg/dL — ABNORMAL HIGH (ref 8–23)
CO2: 26 mmol/L (ref 22–32)
Calcium: 10.6 mg/dL — ABNORMAL HIGH (ref 8.9–10.3)
Chloride: 102 mmol/L (ref 98–111)
Creatinine: 1.27 mg/dL — ABNORMAL HIGH (ref 0.61–1.24)
GFR, Estimated: >60 mL/min (ref >60)
Glucose, Bld: 89 mg/dL (ref 70–99)
Potassium: 4.7 mmol/L (ref 3.5–5.1)
Sodium: 134 mmol/L — ABNORMAL LOW (ref 135–145)
Total Bilirubin: 0.5 mg/dL (ref 0.3–1.2)
Total Protein: 7.6 g/dL (ref 6.5–8.1)

## 2021-12-26 MED ORDER — SODIUM CHLORIDE 0.9 % IV SOLN: Freq: Once | INTRAVENOUS | Status: AC

## 2021-12-26 MED ORDER — DEXTROSE 5 % IV SOLN
50.0000 mg | Freq: Once | INTRAVENOUS | Status: AC
  Administered 2021-12-26: 50 mg via INTRAVENOUS
  Filled 2021-12-26: qty 15

## 2021-12-26 MED ORDER — ZOLEDRONIC ACID 4 MG/100ML IV SOLN
4.0000 mg | Freq: Once | INTRAVENOUS | Status: AC
  Administered 2021-12-26: 4 mg via INTRAVENOUS
  Filled 2021-12-26: qty 100

## 2021-12-26 MED ORDER — DEXAMETHASONE 4 MG PO TABS
10.0000 mg | ORAL_TABLET | Freq: Once | ORAL | Status: AC
  Administered 2021-12-26: 10 mg via ORAL
  Filled 2021-12-26: qty 3

## 2021-12-26 MED ORDER — SODIUM CHLORIDE 0.9 % IV SOLN
250.0000 mg/m2 | Freq: Once | INTRAVENOUS | Status: AC
  Administered 2021-12-26: 500 mg via INTRAVENOUS
  Filled 2021-12-26: qty 25

## 2021-12-26 NOTE — Patient Instructions (Signed)
[facility]  ? Discharge Instructions: ?Thank you for choosing [facility] to provide your oncology and hematology care.  ? ?If you have a lab appointment with the [facility], please go directly to the [facility] and check in at the registration area. ?  ?Wear comfortable clothing and clothing appropriate for easy access to any Portacath or PICC line.  ? ?We strive to give you quality time with your provider. You may need to reschedule your appointment if you arrive late (15 or more minutes).  Arriving late affects you and other patients whose appointments are after yours.  Also, if you miss three or more appointments without notifying the office, you may be dismissed from the clinic at the provider?s discretion.    ?  ?For prescription refill requests, have your pharmacy contact our office and allow 72 hours for refills to be completed.   ? ?Today you received the following chemotherapy and/or immunotherapy agents Zometa, Cytoxan, Kyprolis    ?  ?To help prevent nausea and vomiting after your treatment, we encourage you to take your nausea medication as directed. ? ?BELOW ARE SYMPTOMS THAT SHOULD BE REPORTED IMMEDIATELY: ?*FEVER GREATER THAN 100.4 F (38 ?C) OR HIGHER ?*CHILLS OR SWEATING ?*NAUSEA AND VOMITING THAT IS NOT CONTROLLED WITH YOUR NAUSEA MEDICATION ?*UNUSUAL SHORTNESS OF BREATH ?*UNUSUAL BRUISING OR BLEEDING ?*URINARY PROBLEMS (pain or burning when urinating, or frequent urination) ?*BOWEL PROBLEMS (unusual diarrhea, constipation, pain near the anus) ?TENDERNESS IN MOUTH AND THROAT WITH OR WITHOUT PRESENCE OF ULCERS (sore throat, sores in mouth, or a toothache) ?UNUSUAL RASH, SWELLING OR PAIN  ?UNUSUAL VAGINAL DISCHARGE OR ITCHING  ? ?Items with * indicate a potential emergency and should be followed up as soon as possible or go to the Emergency Department if any problems should occur. ? ?Please show the CHEMOTHERAPY ALERT CARD or IMMUNOTHERAPY ALERT CARD  at check-in to the Emergency Department and triage nurse. ? ?Should you have questions after your visit or need to cancel or reschedule your appointment, please contact [facility]  Dept: [phone]  and follow the prompts.  Office hours are 8:00 a.m. to 4:30 p.m. Monday - Friday. Please note that voicemails left after 4:00 p.m. may not be returned until the following business day.  We are closed weekends and major holidays. You have access to a nurse at all times for urgent questions. Please call the main number to the clinic Dept: [phone] and follow the prompts. ? ? ?For any non-urgent questions, you may also contact your provider using MyChart. We now offer e-Visits for anyone 63 and older to request care online for non-urgent symptoms. For details visit [email or web address]. ?  ?Also download the MyChart app! Go to the app store, search "MyChart", open the app, select [facility], and log in with your MyChart username and password. ? ?Due to Covid, a mask is required upon entering the hospital/clinic. If you do not have a mask, one will be given to you upon arrival. For doctor visits, patients may have 1 support person aged 59 or older with them. For treatment visits, patients cannot have anyone with them due to current Covid guidelines and our immunocompromised population.  ? ?Zoledronic Acid Injection (Hypercalcemia, Oncology) ?What is this medication? ?ZOLEDRONIC ACID (ZOE le dron ik AS id) slows calcium loss from bones. It high calcium levels in the blood from some kinds of cancer. It may be used in other people at risk for bone loss. ?  This medicine may be used for other purposes; ask your health care provider or pharmacist if you have questions. ?COMMON BRAND NAME(S): Zometa ?What should I tell my care team before I take this medication? ?They need to know if you have any of these conditions: ?cancer ?dehydration ?dental disease ?kidney disease ?liver disease ?low levels  of calcium in the blood ?lung or breathing disease (asthma) ?receiving steroids like dexamethasone or prednisone ?an unusual or allergic reaction to zoledronic acid, other medicines, foods, dyes, or preservatives ?pregnant or trying to get pregnant ?breast-feeding ?How should I use this medication? ?This drug is injected into a vein. It is given by a health care provider in a hospital or clinic setting. ?Talk to your health care provider about the use of this drug in children. Special care may be needed. ?Overdosage: If you think you have taken too much of this medicine contact a poison control center or emergency room at once. ?NOTE: This medicine is only for you. Do not share this medicine with others. ?What if I miss a dose? ?Keep appointments for follow-up doses. It is important not to miss your dose. Call your health care provider if you are unable to keep an appointment. ?What may interact with this medication? ?certain antibiotics given by injection ?NSAIDs, medicines for pain and inflammation, like ibuprofen or naproxen ?some diuretics like bumetanide, furosemide ?teriparatide ?thalidomide ?This list may not describe all possible interactions. Give your health care provider a list of all the medicines, herbs, non-prescription drugs, or dietary supplements you use. Also tell them if you smoke, drink alcohol, or use illegal drugs. Some items may interact with your medicine. ?What should I watch for while using this medication? ?Visit your health care provider for regular checks on your progress. It may be some time before you see the benefit from this drug. ?Some people who take this drug have severe bone, joint, or muscle pain. This drug may also increase your risk for jaw problems or a broken thigh bone. Tell your health care provider right away if you have severe pain in your jaw, bones, joints, or muscles. Tell you health care provider if you have any pain that does not go away or that gets worse. ?Tell  your dentist and dental surgeon that you are taking this drug. You should not have major dental surgery while on this drug. See your dentist to have a dental exam and fix any dental problems before starting this drug. Take good care of your teeth while on this drug. Make sure you see your dentist for regular follow-up appointments. ?You should make sure you get enough calcium and vitamin D while you are taking this drug. Discuss the foods you eat and the vitamins you take with your health care provider. ?Check with your health care provider if you have severe diarrhea, nausea, and vomiting, or if you sweat a lot. The loss of too much body fluid may make it dangerous for you to take this drug. ?You may need blood work done while you are taking this drug. ?Do not become pregnant while taking this drug. Women should inform their health care provider if they wish to become pregnant or think they might be pregnant. There is potential for serious harm to an unborn child. Talk to your health care provider for more information. ?What side effects may I notice from receiving this medication? ?Side effects that you should report to your doctor or health care provider as soon as possible: ?allergic reactions (skin rash,  itching or hives; swelling of the face, lips, or tongue) ?bone pain ?infection (fever, chills, cough, sore throat, pain or trouble passing urine) ?jaw pain, especially after dental work ?joint pain ?kidney injury (trouble passing urine or change in the amount of urine) ?low blood pressure (dizziness; feeling faint or lightheaded, falls; unusually weak or tired) ?low calcium levels (fast heartbeat; muscle cramps or pain; pain, tingling, or numbness in the hands or feet; seizures) ?low magnesium levels (fast, irregular heartbeat; muscle cramp or pain; muscle weakness; tremors; seizures) ?low red blood cell counts (trouble breathing; feeling faint; lightheaded, falls; unusually weak or tired) ?muscle  pain ?redness, blistering, peeling, or loosening of the skin, including inside the mouth ?severe diarrhea ?swelling of the ankles, feet, hands ?trouble breathing ?Side effects that usually do not require medical att

## 2021-12-27 ENCOUNTER — Other Ambulatory Visit: Payer: Self-pay | Admitting: Oncology

## 2021-12-30 ENCOUNTER — Other Ambulatory Visit: Payer: Self-pay | Admitting: Internal Medicine

## 2021-12-30 ENCOUNTER — Encounter: Payer: Self-pay | Admitting: Oncology

## 2021-12-30 ENCOUNTER — Other Ambulatory Visit (HOSPITAL_BASED_OUTPATIENT_CLINIC_OR_DEPARTMENT_OTHER): Payer: Self-pay

## 2021-12-30 MED FILL — Valacyclovir HCl Tab 1 GM: ORAL | 90 days supply | Qty: 90 | Fill #0 | Status: AC

## 2022-01-02 ENCOUNTER — Inpatient Hospital Stay: Payer: Medicare Other

## 2022-01-02 ENCOUNTER — Inpatient Hospital Stay (HOSPITAL_BASED_OUTPATIENT_CLINIC_OR_DEPARTMENT_OTHER): Payer: Medicare Other | Admitting: Nurse Practitioner

## 2022-01-02 ENCOUNTER — Encounter: Payer: Self-pay | Admitting: Nurse Practitioner

## 2022-01-02 ENCOUNTER — Encounter: Payer: Self-pay | Admitting: *Deleted

## 2022-01-02 VITALS — BP 111/61 | HR 76 | Temp 98.2°F | Resp 20 | Ht 69.0 in | Wt 182.6 lb

## 2022-01-02 DIAGNOSIS — C9000 Multiple myeloma not having achieved remission: Secondary | ICD-10-CM | POA: Diagnosis not present

## 2022-01-02 DIAGNOSIS — Z5112 Encounter for antineoplastic immunotherapy: Secondary | ICD-10-CM | POA: Diagnosis not present

## 2022-01-02 DIAGNOSIS — T451X5A Adverse effect of antineoplastic and immunosuppressive drugs, initial encounter: Secondary | ICD-10-CM

## 2022-01-02 DIAGNOSIS — Z9481 Bone marrow transplant status: Secondary | ICD-10-CM

## 2022-01-02 LAB — CMP (CANCER CENTER ONLY)
ALT: 13 U/L (ref 0–44)
AST: 21 U/L (ref 15–41)
Albumin: 4.1 g/dL (ref 3.5–5.0)
Alkaline Phosphatase: 50 U/L (ref 38–126)
Anion gap: 7 (ref 5–15)
BUN: 39 mg/dL — ABNORMAL HIGH (ref 8–23)
CO2: 24 mmol/L (ref 22–32)
Calcium: 9.8 mg/dL (ref 8.9–10.3)
Chloride: 102 mmol/L (ref 98–111)
Creatinine: 1.08 mg/dL (ref 0.61–1.24)
GFR, Estimated: >60 mL/min (ref >60)
Glucose, Bld: 69 mg/dL — ABNORMAL LOW (ref 70–99)
Potassium: 4.5 mmol/L (ref 3.5–5.1)
Sodium: 133 mmol/L — ABNORMAL LOW (ref 135–145)
Total Bilirubin: 0.4 mg/dL (ref 0.3–1.2)
Total Protein: 7.5 g/dL (ref 6.5–8.1)

## 2022-01-02 LAB — CBC WITH DIFFERENTIAL (CANCER CENTER ONLY)
Abs Immature Granulocytes: 0.02 10*3/uL (ref 0.00–0.07)
Basophils Absolute: 0 10*3/uL (ref 0.0–0.1)
Basophils Relative: 0 %
Eosinophils Absolute: 0.1 10*3/uL (ref 0.0–0.5)
Eosinophils Relative: 2 %
HCT: 32.2 % — ABNORMAL LOW (ref 39.0–52.0)
Hemoglobin: 10.6 g/dL — ABNORMAL LOW (ref 13.0–17.0)
Immature Granulocytes: 0 %
Lymphocytes Relative: 11 %
Lymphs Abs: 0.7 10*3/uL (ref 0.7–4.0)
MCH: 32.3 pg (ref 26.0–34.0)
MCHC: 32.9 g/dL (ref 30.0–36.0)
MCV: 98.2 fL (ref 80.0–100.0)
Monocytes Absolute: 0.9 10*3/uL (ref 0.1–1.0)
Monocytes Relative: 14 %
Neutro Abs: 4.6 10*3/uL (ref 1.7–7.7)
Neutrophils Relative %: 73 %
Platelet Count: 98 10*3/uL — ABNORMAL LOW (ref 150–400)
RBC: 3.28 MIL/uL — ABNORMAL LOW (ref 4.22–5.81)
RDW: 17.2 % — ABNORMAL HIGH (ref 11.5–15.5)
WBC Count: 6.4 10*3/uL (ref 4.0–10.5)
nRBC: 0 % (ref 0.0–0.2)

## 2022-01-02 MED ORDER — SODIUM CHLORIDE 0.9 % IV SOLN: Freq: Once | INTRAVENOUS | Status: AC

## 2022-01-02 MED ORDER — SODIUM CHLORIDE 0.9 % IV SOLN
250.0000 mg/m2 | Freq: Once | INTRAVENOUS | Status: AC
  Administered 2022-01-02: 500 mg via INTRAVENOUS
  Filled 2022-01-02: qty 25

## 2022-01-02 MED ORDER — DEXAMETHASONE 4 MG PO TABS
10.0000 mg | ORAL_TABLET | Freq: Once | ORAL | Status: AC
  Administered 2022-01-02: 10 mg via ORAL
  Filled 2022-01-02: qty 3

## 2022-01-02 MED ORDER — DEXTROSE 5 % IV SOLN
27.0000 mg/m2 | Freq: Once | INTRAVENOUS | Status: AC
  Administered 2022-01-02: 54 mg via INTRAVENOUS
  Filled 2022-01-02: qty 27

## 2022-01-02 NOTE — Progress Notes (Signed)
?[facility] ?OFFICE PROGRESS NOTE ? ? ?Diagnosis: Multiple myeloma ? ?INTERVAL HISTORY:  ? ?Andre [name] returns as scheduled.  He began cycle 1 Cytoxan/carfilzomib/Decadron 12/19/2021.  He has occasional mild nausea.  No mouth sores.  No diarrhea.  No rash.  He remains very active.  He exercises 6 days a week. ? ?Objective: ? ?Vital signs in last 24 hours: ? ?Blood pressure 111/61, pulse 76, temperature 98.2 ?F (36.8 ?C), temperature source Oral, resp. rate 20, height _0  (1.753 m), weight 182 lb 9.6 oz (82.8 kg), SpO2 100 %. ?  ? ?HEENT: No thrush or ulcers. ?Resp: Lungs clear bilaterally. ?Cardio: Regular rate and rhythm. ?GI: Abdomen soft and nontender.  No hepatosplenomegaly. ?Vascular: Trace lower leg edema bilaterally. ? ? ?Lab Results: ? ?Lab Results  ?Component Value Date  ? WBC 6.4 01/02/2022  ? HGB 10.6 (L) 01/02/2022  ? HCT 32.2 (L) 01/02/2022  ? MCV 98.2 01/02/2022  ? PLT 98 (L) 01/02/2022  ? NEUTROABS 4.6 01/02/2022  ? ? ?Imaging: ? ?No results found. ? ?Medications: I have reviewed the patient's current medications. ? ?Assessment/Plan: ?Multiple myeloma- IgG lambda ? bortezomib (subcutaneously), lenalidomide, and dexamethasone, with repeat bone marrow biopsy May of 2012 showing 10% plasmacytosis ?  ?(2) high-dose chemotherapy with BCNU and melphalan at [facility], followed by stem cell rescue July of 2012 ?  ?(3) on zoledronic acid started December of 2012, initially monthly, currently given every 3 months, most recent dose  12/07/2015 ?  ?(4) low-dose lenalidomide resumed April 2013, interrupted several times.  Resumed again on 02/19/2013, ata dose of 5 mg daily, 21 days on and 7 days off, later further reduced to 7 days on, 7 days off ?  ?(5) CNS symptoms and abnormal brain MRI extensively evaluated by neurology with no definitive diagnosis established ?  ?(6) rising M spike noted June 2014 but did not meet criteria for carfilzomib study ?  ?(7) on lenalidomide 25 mg daily, 14 days on, 7  days off, starting 04/18/2013, interrupted December 2014 because of rash; ?            (a) resumed January 2015 at 10 mg/ day at 21 days on/ 7 days off ?            ([name]) starting 08/18/2014 decreased to 10 mg/ day 14 days on, 7 days off because of cytopenias ?            (c) as of February 2016 was on 5 mg daily 7 days on 7 days off ?            (d) lenalidomide discontinued December 2016 with evidence of disease progression ?  ?(8) transient global amnesia 05/29/2015, resolved without intervention ?  ?(9) starting PVD 10/25/2015 w ASA 325 thromboprophylaxis, valacyclovir prophylaxis, last dose 12/17/2015 ?            (a) pomalidomide 4 mg/d days 1-14 ?            ([name]) bortezomib sQ days 2,5,9,12 of each 21 day cycle ?            (c) dexamethasome 20 mg two days a week ?            (d) dexamethasone bortezomib and pomalidomide discontinued late December 2018 with poor tolerance ?  ?(10) metapneumovirus pneumonia April 2017 ?            (a) completing course of steroids and week of bactrim mid April 2017 ?  ?(11)  status post second autologous transplant at [facility] 02/04/2016(preparatory regimen melphalan 200 mg/m?) ?            (a) received twelve-month vaccinations 03/14/2017 (DPT, Haemophilus, Pneumovax 13, polio) ?            ([name]) 14 months injections 05/04/2016 include DTaP, Hib conjugate, HepB energex Gaje 20 mcg/ml, Prevnar 13 ?            (c) 24 month vaccines due at [facility] June 2019 ?  ?(12) maintenance therapy started November 2017, consisting of ?            (a) bortezomib 1.3 mg/M2 every 14 days, first dose 07/27/2016 ?            ([name]) pomalidomide 1 mg days 1-21 Q28 days, started 07/19/2016 ?            (c) zolendronate monthly started 07/27/2016 (previously Q 3 months) however patient unable to tolerate, and changed back to q3 months in April, 2018 ?            (d) Bortezomib changed to monthly as of June 2018 because of tolerance issues, however discontinued after 09/14/2017 dose because of a rise in his M spike ?             (e) pomalidomide held after 10/18/2017 in preparation for possible study at [facility] (venetoclax)--never resumed ?            (f) with numbers actually improved off treatment, resumed every 2-week bortezomib 12/25/2017 ?            (g) changed to every 3-week bortezomib as of 03/17/2019 ?            (h) briefly on weekly treatments times 22 October 2020 due to increase in M spike ?            (I) maintenance therapy discontinued with evidence of progression ?  ?(13) bortezomib/daratumumab/dexamethasone started 12/20/2020. ?            (a) Decadron dose dropped to 10 mg day of and day following treatment starting May 2022 ?            ([name]) day 8 bortezomib omitted beginning with the June cycle per patient preference ?            (C) treatment changed to monthly daratumumab/Velcade/Decadron beginning 06/27/2021-patient decision ?(14) weekly Velcade/Cytoxan beginning 11/14/2021 ?(15) changed to Cytoxan/carfilzomib/Decadron 12/19/2021 ? ?Disposition: Mr. [name] appears stable.  He will complete cycle 1 day 15 Cytoxan/Carfilzomib/Decadron today.  We will check a myeloma panel when he returns to begin cycle 2. ? ?CBC and chemistry panel reviewed.  Labs adequate to proceed with treatment today as scheduled. ? ?He will return for lab, follow-up, cycle 2-day 1 Cytoxan/carfilzomib/Decadron on 01/16/2022.  We are available to see him sooner if needed. ? ? ? ?[name] ANP/GNP-BC  ? ?01/02/2022  ?11:34 AM ? ? ? ? ? ? ? ?

## 2022-01-02 NOTE — Progress Notes (Signed)
Patient presents for treatment. RN assessment completed along with the following: ? ?Labs/vitals reviewed - Yes, and per Merceda Elks, RN note, ok to proceed with platelets 98.     ?Weight within 10% of previous measurement - Yes ?Informed consent completed and reflects current therapy/intent - Yes, on date 12/18/21             ?Provider progress note reviewed - Yes, today's provider note was reviewed. ?Treatment/Antibody/Supportive plan reviewed - Yes, and there are no adjustments needed for today's treatment. ?S&H and other orders reviewed - Yes, and there are no additional orders identified. ?Previous treatment date reviewed - Yes, and the appropriate amount of time has elapsed between treatments. ?Clinic Hand Off Received from - Merceda Elks, RN ? ?Patient to proceed with treatment.   ?

## 2022-01-02 NOTE — Progress Notes (Addendum)
Patient seen by [name] NP today ? ?Vitals are within treatment parameters. ? ?Labs reviewed by [name] NP and are all not within treatment parameters. ?Platelet 98,000--OK to proceed. ? ?Per physician team, patient is ready for treatment and there are NO modifications to the treatment plan.  ?

## 2022-01-02 NOTE — Patient Instructions (Signed)
[facility]  Discharge Instructions: ?Thank you for choosing [facility] to provide your oncology and hematology care.  ? ?If you have a lab appointment with the [facility], please go directly to the [facility] and check in at the registration area. ?  ?Wear comfortable clothing and clothing appropriate for easy access to any Portacath or PICC line.  ? ?We strive to give you quality time with your provider. You may need to reschedule your appointment if you arrive late (15 or more minutes).  Arriving late affects you and other patients whose appointments are after yours.  Also, if you miss three or more appointments without notifying the office, you may be dismissed from the clinic at the provider?s discretion.    ?  ?For prescription refill requests, have your pharmacy contact our office and allow 72 hours for refills to be completed.   ? ?Today you received the following chemotherapy and/or immunotherapy agents: carfilzomib, cyclophosphamide.  ? ?Carfilzomib injection ?What is this medication? ?CARFILZOMIB (kar FILZ oh mib) targets a specific protein within cancer cells and stops the cancer cells from growing. It is used to treat multiple myeloma. ?This medicine may be used for other purposes; ask your health care provider or pharmacist if you have questions. ?COMMON BRAND NAME(S): KYPROLIS ?What should I tell my care team before I take this medication? ?They need to know if you have any of these conditions: ?heart disease ?history of blood clots ?irregular heartbeat ?kidney disease ?liver disease ?lung or breathing disease ?an unusual or allergic reaction to carfilzomib, or other medicines, foods, dyes, or preservatives ?pregnant or trying to get pregnant ?breast-feeding ?How should I use this medication? ?This medicine is for injection or infusion into a vein. It is given by a health care professional in a hospital or clinic setting. ?Talk to your pediatrician  regarding the use of this medicine in children. Special care may be needed. ?Overdosage: If you think you have taken too much of this medicine contact a poison control center or emergency room at once. ?NOTE: This medicine is only for you. Do not share this medicine with others. ?What if I miss a dose? ?It is important not to miss your dose. Call your doctor or health care professional if you are unable to keep an appointment. ?What may interact with this medication? ?Interactions are not expected. ?This list may not describe all possible interactions. Give your health care provider a list of all the medicines, herbs, non-prescription drugs, or dietary supplements you use. Also tell them if you smoke, drink alcohol, or use illegal drugs. Some items may interact with your medicine. ?What should I watch for while using this medication? ?Your condition will be monitored while you are receiving this medicine. ?You may need blood work done while you are taking this medicine. ?Do not become pregnant while taking this medicine or for 6 months after stopping it. Women should inform their health care provider if they wish to become pregnant or think they might be pregnant. Men should not father a child while taking this medicine and for 3 months after stopping it. There is a potential for serious side effects to an unborn child. Talk to your health care provider for more information. Do not breast-feed an infant while taking this medicine or for 2 weeks after stopping it. ?Check with your health care provider if you have severe diarrhea, nausea, and vomiting, or if you sweat a lot. The loss of too much  body fluid may make it dangerous for you to take this medicine. ?You may get drowsy or dizzy. Do not drive, use machinery, or do anything that needs mental alertness until you know how this medicine affects you. Do not stand up or sit up quickly, especially if you are an older patient. This reduces the risk of dizzy or  fainting spells. ?What side effects may I notice from receiving this medication? ?Side effects that you should report to your doctor or health care professional as soon as possible: ?allergic reactions like skin rash, itching or hives, swelling of the face, lips, or tongue ?confusion ?dizziness ?feeling faint or lightheaded ?fever or chills ?palpitations ?seizures ?signs and symptoms of bleeding such as bloody or black, tarry stools; red or dark-brown urine; spitting up blood or brown material that looks like coffee grounds; red spots on the skin; unusual bruising or bleeding including from the eye, gums, or nose ?signs and symptoms of a blood clot such as breathing problems; changes in vision; chest pain; severe, sudden headache; pain, swelling, warmth in the leg; trouble speaking; sudden numbness or weakness of the face, arm or leg ?signs and symptoms of kidney injury like trouble passing urine or change in the amount of urine ?signs and symptoms of liver injury like dark yellow or brown urine; general ill feeling or flu-like symptoms; light-colored stools; loss of appetite; nausea; right upper belly pain; unusually weak or tired; yellowing of the eyes or skin ?Side effects that usually do not require medical attention (report to your doctor or health care professional if they continue or are bothersome): ?back pain ?cough ?diarrhea ?headache ?muscle cramps ?trouble sleeping ?vomiting ?This list may not describe all possible side effects. Call your doctor for medical advice about side effects. You may report side effects to FDA at 1-800-FDA-1088. ?Where should I keep my medication? ?This drug is given in a hospital or clinic and will not be stored at home. ?NOTE: This sheet is a summary. It may not cover all possible information. If you have questions about this medicine, talk to your doctor, pharmacist, or health care provider. ?? 2022 Elsevier/Gold Standard (2020-10-14 00:00:00) ?    ?Cyclophosphamide  Injection ?What is this medication? ?CYCLOPHOSPHAMIDE (sye kloe FOSS fa mide) is a chemotherapy drug. It slows the growth of cancer cells. This medicine is used to treat many types of cancer like lymphoma, myeloma, leukemia, breast cancer, and ovarian cancer, to name a few. ?This medicine may be used for other purposes; ask your health care provider or pharmacist if you have questions. ?COMMON BRAND NAME(S): Cytoxan, Neosar ?What should I tell my care team before I take this medication? ?They need to know if you have any of these conditions: ?heart disease ?history of irregular heartbeat ?infection ?kidney disease ?liver disease ?low blood counts, like white cells, platelets, or red blood cells ?on hemodialysis ?recent or ongoing radiation therapy ?scarring or thickening of the lungs ?trouble passing urine ?an unusual or allergic reaction to cyclophosphamide, other medicines, foods, dyes, or preservatives ?pregnant or trying to get pregnant ?breast-feeding ?How should I use this medication? ?This drug is usually given as an injection into a vein or muscle or by infusion into a vein. It is administered in a hospital or clinic by a specially trained health care professional. ?Talk to your pediatrician regarding the use of this medicine in children. Special care may be needed. ?Overdosage: If you think you have taken too much of this medicine contact a poison control center or emergency  room at once. ?NOTE: This medicine is only for you. Do not share this medicine with others. ?What if I miss a dose? ?It is important not to miss your dose. Call your doctor or health care professional if you are unable to keep an appointment. ?What may interact with this medication? ?amphotericin M[place] ?azathioprine ?certain antivirals for HIV or hepatitis ?certain medicines for blood pressure, heart disease, irregular heart beat ?certain medicines that treat or prevent blood clots like warfarin ?certain other medicines for  cancer ?cyclosporine ?etanercept ?indomethacin ?medicines that relax muscles for surgery ?medicines to increase blood counts ?metronidazole ?This list may not describe all possible interactions. Give your health care provider a list of a

## 2022-01-06 ENCOUNTER — Other Ambulatory Visit: Payer: Self-pay | Admitting: Nurse Practitioner

## 2022-01-15 ENCOUNTER — Other Ambulatory Visit: Payer: Self-pay | Admitting: Oncology

## 2022-01-16 ENCOUNTER — Encounter: Payer: Self-pay | Admitting: Oncology

## 2022-01-16 ENCOUNTER — Inpatient Hospital Stay (HOSPITAL_BASED_OUTPATIENT_CLINIC_OR_DEPARTMENT_OTHER): Payer: Medicare Other | Admitting: Oncology

## 2022-01-16 ENCOUNTER — Inpatient Hospital Stay: Payer: Medicare Other | Attending: Oncology

## 2022-01-16 ENCOUNTER — Inpatient Hospital Stay: Payer: Medicare Other

## 2022-01-16 ENCOUNTER — Encounter: Payer: Self-pay | Admitting: *Deleted

## 2022-01-16 VITALS — BP 116/59 | HR 64 | Temp 98.2°F | Resp 20 | Ht 69.0 in | Wt 180.0 lb

## 2022-01-16 DIAGNOSIS — C9000 Multiple myeloma not having achieved remission: Secondary | ICD-10-CM

## 2022-01-16 DIAGNOSIS — Z5111 Encounter for antineoplastic chemotherapy: Secondary | ICD-10-CM | POA: Diagnosis present

## 2022-01-16 DIAGNOSIS — Z5112 Encounter for antineoplastic immunotherapy: Secondary | ICD-10-CM | POA: Diagnosis present

## 2022-01-16 DIAGNOSIS — Z79899 Other long term (current) drug therapy: Secondary | ICD-10-CM | POA: Diagnosis not present

## 2022-01-16 DIAGNOSIS — Z9481 Bone marrow transplant status: Secondary | ICD-10-CM

## 2022-01-16 DIAGNOSIS — T451X5A Adverse effect of antineoplastic and immunosuppressive drugs, initial encounter: Secondary | ICD-10-CM

## 2022-01-16 LAB — CMP (CANCER CENTER ONLY)
ALT: 14 U/L (ref 0–44)
AST: 19 U/L (ref 15–41)
Albumin: 4 g/dL (ref 3.5–5.0)
Alkaline Phosphatase: 51 U/L (ref 38–126)
Anion gap: 8 (ref 5–15)
BUN: 30 mg/dL — ABNORMAL HIGH (ref 8–23)
CO2: 22 mmol/L (ref 22–32)
Calcium: 10.2 mg/dL (ref 8.9–10.3)
Chloride: 105 mmol/L (ref 98–111)
Creatinine: 1.03 mg/dL (ref 0.61–1.24)
GFR, Estimated: >60 mL/min (ref >60)
Glucose, Bld: 85 mg/dL (ref 70–99)
Potassium: 4.3 mmol/L (ref 3.5–5.1)
Sodium: 135 mmol/L (ref 135–145)
Total Bilirubin: 0.4 mg/dL (ref 0.3–1.2)
Total Protein: 7.4 g/dL (ref 6.5–8.1)

## 2022-01-16 LAB — CBC WITH DIFFERENTIAL (CANCER CENTER ONLY)
Abs Immature Granulocytes: 0.01 10*3/uL (ref 0.00–0.07)
Basophils Absolute: 0 10*3/uL (ref 0.0–0.1)
Basophils Relative: 0 %
Eosinophils Absolute: 0.2 10*3/uL (ref 0.0–0.5)
Eosinophils Relative: 3 %
HCT: 31.5 % — ABNORMAL LOW (ref 39.0–52.0)
Hemoglobin: 10.4 g/dL — ABNORMAL LOW (ref 13.0–17.0)
Immature Granulocytes: 0 %
Lymphocytes Relative: 15 %
Lymphs Abs: 0.7 10*3/uL (ref 0.7–4.0)
MCH: 32.4 pg (ref 26.0–34.0)
MCHC: 33 g/dL (ref 30.0–36.0)
MCV: 98.1 fL (ref 80.0–100.0)
Monocytes Absolute: 0.9 10*3/uL (ref 0.1–1.0)
Monocytes Relative: 17 %
Neutro Abs: 3.2 10*3/uL (ref 1.7–7.7)
Neutrophils Relative %: 65 %
Platelet Count: 108 10*3/uL — ABNORMAL LOW (ref 150–400)
RBC: 3.21 MIL/uL — ABNORMAL LOW (ref 4.22–5.81)
RDW: 17.7 % — ABNORMAL HIGH (ref 11.5–15.5)
WBC Count: 5 10*3/uL (ref 4.0–10.5)
nRBC: 0 % (ref 0.0–0.2)

## 2022-01-16 MED ORDER — DEXTROSE 5 % IV SOLN
36.0000 mg/m2 | Freq: Once | INTRAVENOUS | Status: AC
  Administered 2022-01-16: 70 mg via INTRAVENOUS
  Filled 2022-01-16: qty 30

## 2022-01-16 MED ORDER — DEXAMETHASONE 4 MG PO TABS
10.0000 mg | ORAL_TABLET | Freq: Once | ORAL | Status: AC
  Administered 2022-01-16: 10 mg via ORAL
  Filled 2022-01-16: qty 3

## 2022-01-16 MED ORDER — SODIUM CHLORIDE 0.9 % IV SOLN: Freq: Once | INTRAVENOUS | Status: AC

## 2022-01-16 MED ORDER — SODIUM CHLORIDE 0.9 % IV SOLN
250.0000 mg/m2 | Freq: Once | INTRAVENOUS | Status: AC
  Administered 2022-01-16: 500 mg via INTRAVENOUS
  Filled 2022-01-16: qty 25

## 2022-01-16 NOTE — Progress Notes (Signed)
?[facility] ?OFFICE PROGRESS NOTE ? ? ?Diagnosis: Multiple myeloma ? ?INTERVAL HISTORY:  ? ?Andre [name] completed another treatment with carfilzomib/Cytoxan on 01/02/2022.  He reports tolerating treatment well.  No mouth sores or dyspnea.  He has mild nausea, but no emesis.  He has pruritus at the right ankle.  No leg swelling or pain. ? ?Objective: ? ?Vital signs in last 24 hours: ? ?Blood pressure (!) 116/59, pulse 64, temperature 98.2 ?F (36.8 ?C), temperature source Oral, resp. rate 20, height 5' 9"  (1.753 m), weight 180 lb (81.6 kg), SpO2 98 %. ?  ? ?HEENT: No thrush, few healing ulcers/1-2 mm ecchymoses at the right buccal mucosa ?Resp: Lungs clear bilaterally ?Cardio: Regular rate and rhythm ?GI: No hepatosplenomegaly ?Vascular: Lower extremity varicosities, no edema or erythema ?  ? ? ?Lab Results: ? ?Lab Results  ?Component Value Date  ? WBC 5.0 01/16/2022  ? HGB 10.4 (L) 01/16/2022  ? HCT 31.5 (L) 01/16/2022  ? MCV 98.1 01/16/2022  ? PLT 108 (L) 01/16/2022  ? NEUTROABS 3.2 01/16/2022  ? ? ?CMP  ?Lab Results  ?Component Value Date  ? NA 133 (L) 01/02/2022  ? K 4.5 01/02/2022  ? CL 102 01/02/2022  ? CO2 24 01/02/2022  ? GLUCOSE 69 (L) 01/02/2022  ? BUN 39 (H) 01/02/2022  ? CREATININE 1.08 01/02/2022  ? CALCIUM 9.8 01/02/2022  ? PROT 7.5 01/02/2022  ? ALBUMIN 4.1 01/02/2022  ? AST 21 01/02/2022  ? ALT 13 01/02/2022  ? ALKPHOS 50 01/02/2022  ? BILITOT 0.4 01/02/2022  ? GFRNONAA >60 01/02/2022  ? GFRAA >60 06/15/2020  ? ? ? ?Medications: I have reviewed the patient's current medications. ? ? ?Assessment/Plan: ?Multiple myeloma- IgG lambda ? bortezomib (subcutaneously), lenalidomide, and dexamethasone, with repeat bone marrow biopsy May of 2012 showing 10% plasmacytosis ?  ?(2) high-dose chemotherapy with BCNU and melphalan at [facility], followed by stem cell rescue July of 2012 ?  ?(3) on zoledronic acid started December of 2012, initially monthly, currently given every 3 months, most recent dose   12/07/2015 ?  ?(4) low-dose lenalidomide resumed April 2013, interrupted several times.  Resumed again on 02/19/2013, ata dose of 5 mg daily, 21 days on and 7 days off, later further reduced to 7 days on, 7 days off ?  ?(5) CNS symptoms and abnormal brain MRI extensively evaluated by neurology with no definitive diagnosis established ?  ?(6) rising M spike noted June 2014 but did not meet criteria for carfilzomib study ?  ?(7) on lenalidomide 25 mg daily, 14 days on, 7 days off, starting 04/18/2013, interrupted December 2014 because of rash; ?            (a) resumed January 2015 at 10 mg/ day at 21 days on/ 7 days off ?            (Andre [name]) starting 08/18/2014 decreased to 10 mg/ day 14 days on, 7 days off because of cytopenias ?            (c) as of February 2016 was on 5 mg daily 7 days on 7 days off ?            (d) lenalidomide discontinued December 2016 with evidence of disease progression ?  ?(8) transient global amnesia 05/29/2015, resolved without intervention ?  ?(9) starting PVD 10/25/2015 w ASA 325 thromboprophylaxis, valacyclovir prophylaxis, last dose 12/17/2015 ?            (a) pomalidomide 4 mg/d days 1-14 ?            (  Andre [name]) bortezomib sQ days 2,5,9,12 of each 21 day cycle ?            (c) dexamethasome 20 mg two days a week ?            (d) dexamethasone bortezomib and pomalidomide discontinued late December 2018 with poor tolerance ?  ?(10) metapneumovirus pneumonia April 2017 ?            (a) completing course of steroids and week of bactrim mid April 2017 ?  ?(11) status post second autologous transplant at [facility] 02/04/2016(preparatory regimen melphalan 200 mg/m?) ?            (a) received twelve-month vaccinations 03/14/2017 (DPT, Haemophilus, Pneumovax 13, polio) ?            (Andre [name]) 14 months injections 05/04/2016 include DTaP, Hib conjugate, HepB energex Jan 20 mcg/ml, Prevnar 13 ?            (c) 24 month vaccines due at [facility] June 2019 ?  ?(12) maintenance therapy started November 2017, consisting of ?             (a) bortezomib 1.3 mg/M2 every 14 days, first dose 07/27/2016 ?            (Andre [name]) pomalidomide 1 mg days 1-21 Q28 days, started 07/19/2016 ?            (c) zolendronate monthly started 07/27/2016 (previously Q 3 months) however patient unable to tolerate, and changed back to q3 months in April, 2018 ?            (d) Bortezomib changed to monthly as of June 2018 because of tolerance issues, however discontinued after 09/14/2017 dose because of a rise in his M spike ?            (e) pomalidomide held after 10/18/2017 in preparation for possible study at [facility] (venetoclax)--never resumed ?            (f) with numbers actually improved off treatment, resumed every 2-week bortezomib 12/25/2017 ?            (g) changed to every 3-week bortezomib as of 03/17/2019 ?            (h) briefly on weekly treatments times 22 October 2020 due to increase in M spike ?            (I) maintenance therapy discontinued with evidence of progression ?  ?(13) bortezomib/daratumumab/dexamethasone started 12/20/2020. ?            (a) Decadron dose dropped to 10 mg day of and day following treatment starting May 2022 ?            (Andre [name]) day 8 bortezomib omitted beginning with the June cycle per patient preference ?            (C) treatment changed to monthly daratumumab/Velcade/Decadron beginning 06/27/2021-patient decision ?(14) weekly Velcade/Cytoxan beginning 11/14/2021 ?(15) changed to Cytoxan/carfilzomib/Decadron 12/19/2021 ? ? ? ?Disposition: ?Andre [name] appears stable.  He will complete a second cycle of Cytoxan/carfilzomib/Decadron beginning today.  The carfilzomib dose will be escalated to 36 mg per metered square.  He has tolerated the treatment well to date.  We will follow-up on the myeloma panel from today.  He will return for an office visit in 2 weeks. ? ?Betsy Coder, MD ? ?01/16/2022  ?8:28 AM ? ? ?

## 2022-01-16 NOTE — Patient Instructions (Signed)
[facility]  ?Discharge Instructions: ?Thank you for choosing [facility] to provide your oncology and hematology care.  ? ?If you have a lab appointment with the [facility], please go directly to the [facility] and check in at the registration area. ?  ?Wear comfortable clothing and clothing appropriate for easy access to any Portacath or PICC line.  ? ?We strive to give you quality time with your provider. You may need to reschedule your appointment if you arrive late (15 or more minutes).  Arriving late affects you and other patients whose appointments are after yours.  Also, if you miss three or more appointments without notifying the office, you may be dismissed from the clinic at the provider?s discretion.    ?  ?For prescription refill requests, have your pharmacy contact our office and allow 72 hours for refills to be completed.   ? ?Today you received the following chemotherapy and/or immunotherapy agents Cytoxan, Kyprolis    ?  ?To help prevent nausea and vomiting after your treatment, we encourage you to take your nausea medication as directed. ? ?BELOW ARE SYMPTOMS THAT SHOULD BE REPORTED IMMEDIATELY: ?*FEVER GREATER THAN 100.4 F (38 ?C) OR HIGHER ?*CHILLS OR SWEATING ?*NAUSEA AND VOMITING THAT IS NOT CONTROLLED WITH YOUR NAUSEA MEDICATION ?*UNUSUAL SHORTNESS OF BREATH ?*UNUSUAL BRUISING OR BLEEDING ?*URINARY PROBLEMS (pain or burning when urinating, or frequent urination) ?*BOWEL PROBLEMS (unusual diarrhea, constipation, pain near the anus) ?TENDERNESS IN MOUTH AND THROAT WITH OR WITHOUT PRESENCE OF ULCERS (sore throat, sores in mouth, or a toothache) ?UNUSUAL RASH, SWELLING OR PAIN  ?UNUSUAL VAGINAL DISCHARGE OR ITCHING  ? ?Items with * indicate a potential emergency and should be followed up as soon as possible or go to the Emergency Department if any problems should occur. ? ?Please show the CHEMOTHERAPY ALERT CARD or IMMUNOTHERAPY ALERT CARD at  check-in to the Emergency Department and triage nurse. ? ?Should you have questions after your visit or need to cancel or reschedule your appointment, please contact [facility]  Dept: [phone]  and follow the prompts.  Office hours are 8:00 a.m. to 4:30 p.m. Monday - Friday. Please note that voicemails left after 4:00 p.m. may not be returned until the following business day.  We are closed weekends and major holidays. You have access to a nurse at all times for urgent questions. Please call the main number to the clinic Dept: [phone] and follow the prompts. ? ? ?For any non-urgent questions, you may also contact your provider using MyChart. We now offer e-Visits for anyone 57 and older to request care online for non-urgent symptoms. For details visit [email or web address]. ?  ?Also download the MyChart app! Go to the app store, search "MyChart", open the app, select [facility], and log in with your MyChart username and password. ? ?Due to Covid, a mask is required upon entering the hospital/clinic. If you do not have a mask, one will be given to you upon arrival. For doctor visits, patients may have 1 support person aged 20 or older with them. For treatment visits, patients cannot have anyone with them due to current Covid guidelines and our immunocompromised population.  ? ?Cyclophosphamide Injection ?What is this medication? ?CYCLOPHOSPHAMIDE (sye kloe FOSS fa mide) is a chemotherapy drug. It slows the growth of cancer cells. This medicine is used to treat many types of cancer like lymphoma, myeloma, leukemia, breast cancer, and ovarian cancer, to name a few. ?This medicine  may be used for other purposes; ask your health care provider or pharmacist if you have questions. ?COMMON BRAND NAME(S): Cyclophosphamide, Cytoxan, Neosar ?What should I tell my care team before I take this medication? ?They need to know if you have any of these conditions: ?heart disease ?history of  irregular heartbeat ?infection ?kidney disease ?liver disease ?low blood counts, like white cells, platelets, or red blood cells ?on hemodialysis ?recent or ongoing radiation therapy ?scarring or thickening of the lungs ?trouble passing urine ?an unusual or allergic reaction to cyclophosphamide, other medicines, foods, dyes, or preservatives ?pregnant or trying to get pregnant ?breast-feeding ?How should I use this medication? ?This drug is usually given as an injection into a vein or muscle or by infusion into a vein. It is administered in a hospital or clinic by a specially trained health care professional. ?Talk to your pediatrician regarding the use of this medicine in children. Special care may be needed. ?Overdosage: If you think you have taken too much of this medicine contact a poison control center or emergency room at once. ?NOTE: This medicine is only for you. Do not share this medicine with others. ?What if I miss a dose? ?It is important not to miss your dose. Call your doctor or health care professional if you are unable to keep an appointment. ?What may interact with this medication? ?amphotericin Johne ?azathioprine ?certain antivirals for HIV or hepatitis ?certain medicines for blood pressure, heart disease, irregular heart beat ?certain medicines that treat or prevent blood clots like warfarin ?certain other medicines for cancer ?cyclosporine ?etanercept ?indomethacin ?medicines that relax muscles for surgery ?medicines to increase blood counts ?metronidazole ?This list may not describe all possible interactions. Give your health care provider a list of all the medicines, herbs, non-prescription drugs, or dietary supplements you use. Also tell them if you smoke, drink alcohol, or use illegal drugs. Some items may interact with your medicine. ?What should I watch for while using this medication? ?Your condition will be monitored carefully while you are receiving this medicine. ?You may need blood work  done while you are taking this medicine. ?Drink water or other fluids as directed. Urinate often, even at night. ?Some products may contain alcohol. Ask your health care professional if this medicine contains alcohol. Be sure to tell all health care professionals you are taking this medicine. Certain medicines, like metronidazole and disulfiram, can cause an unpleasant reaction when taken with alcohol. The reaction includes flushing, headache, nausea, vomiting, sweating, and increased thirst. The reaction can last from 30 minutes to several hours. ?Do not become pregnant while taking this medicine or for 1 year after stopping it. Women should inform their health care professional if they wish to become pregnant or think they might be pregnant. Men should not father a child while taking this medicine and for 4 months after stopping it. There is potential for serious side effects to an unborn child. Talk to your health care professional for more information. ?Do not breast-feed an infant while taking this medicine or for 1 week after stopping it. ?This medicine has caused ovarian failure in some women. This medicine may make it more difficult to get pregnant. Talk to your health care professional if you are concerned about your fertility. ?This medicine has caused decreased sperm counts in some men. This may make it more difficult to father a child. Talk to your health care professional if you are concerned about your fertility. ?Call your health care professional for advice if you get  a fever, chills, or sore throat, or other symptoms of a cold or flu. Do not treat yourself. This medicine decreases your body's ability to fight infections. Try to avoid being around people who are sick. ?Avoid taking medicines that contain aspirin, acetaminophen, ibuprofen, naproxen, or ketoprofen unless instructed by your health care professional. These medicines may hide a fever. ?Talk to your health care professional about your risk  of cancer. You may be more at risk for certain types of cancer if you take this medicine. ?If you are going to need surgery or other procedure, tell your health care professional that you are using this medici

## 2022-01-16 NOTE — Progress Notes (Signed)
Patient presents for treatment. RN assessment completed along with the following: ? ?Labs/vitals reviewed - Yes, and within treatment parameters.   ?Weight within 10% of previous measurement - Yes ?Informed consent completed and reflects current therapy/intent - Yes, on date 12/19/21             ?Provider progress note reviewed - Yes, today's provider note was reviewed. ?Treatment/Antibody/Supportive plan reviewed - Please note that modifications are being made to the treatment plan including MD has increased dose on carfizomib.   ?S&H and other orders reviewed - Yes, and there are no additional orders identified. ?Previous treatment date reviewed - Yes, and the appropriate amount of time has elapsed between treatments. ?Clinic Hand Off Received from - Cristy Friedlander, RN ? ?Patient to proceed with treatment.  ? ?

## 2022-01-16 NOTE — Progress Notes (Signed)
Patient seen by Dr. Benay Spice today ? ?Vitals are within treatment parameters. ? ?Labs reviewed by Dr. Benay Spice and are within treatment parameters. ? ?Per physician team, patient is ready for treatment. Please note that modifications are being made to the treatment plan including MD has increased dose on carfizomib.   ?

## 2022-01-17 LAB — PROTEIN ELECTROPHORESIS, SERUM
A/G Ratio: 1.1 (ref 0.7–1.7)
Albumin ELP: 3.7 g/dL (ref 2.9–4.4)
Alpha-1-Globulin: 0.2 g/dL (ref 0.0–0.4)
Alpha-2-Globulin: 0.8 g/dL (ref 0.4–1.0)
Beta Globulin: 1 g/dL (ref 0.7–1.3)
Gamma Globulin: 1.3 g/dL (ref 0.4–1.8)
Globulin, Total: 3.3 g/dL (ref 2.2–3.9)
M-Spike, %: 1.1 g/dL — ABNORMAL HIGH
Total Protein ELP: 7 g/dL (ref 6.0–8.5)

## 2022-01-17 LAB — KAPPA/LAMBDA LIGHT CHAINS
Kappa free light chain: 7.5 mg/L (ref 3.3–19.4)
Kappa, lambda light chain ratio: 0.04 — ABNORMAL LOW (ref 0.26–1.65)
Lambda free light chains: 178.6 mg/L — ABNORMAL HIGH (ref 5.7–26.3)

## 2022-01-21 ENCOUNTER — Other Ambulatory Visit: Payer: Self-pay | Admitting: Oncology

## 2022-01-23 ENCOUNTER — Inpatient Hospital Stay: Payer: Medicare Other

## 2022-01-23 VITALS — BP 134/67 | HR 60 | Temp 98.0°F | Resp 18 | Ht 69.0 in | Wt 182.0 lb

## 2022-01-23 DIAGNOSIS — C9000 Multiple myeloma not having achieved remission: Secondary | ICD-10-CM

## 2022-01-23 DIAGNOSIS — Z9481 Bone marrow transplant status: Secondary | ICD-10-CM

## 2022-01-23 DIAGNOSIS — T451X5A Adverse effect of antineoplastic and immunosuppressive drugs, initial encounter: Secondary | ICD-10-CM

## 2022-01-23 DIAGNOSIS — Z5112 Encounter for antineoplastic immunotherapy: Secondary | ICD-10-CM | POA: Diagnosis not present

## 2022-01-23 LAB — CBC WITH DIFFERENTIAL (CANCER CENTER ONLY)
Abs Immature Granulocytes: 0.03 10*3/uL (ref 0.00–0.07)
Basophils Absolute: 0 10*3/uL (ref 0.0–0.1)
Basophils Relative: 0 %
Eosinophils Absolute: 0.1 10*3/uL (ref 0.0–0.5)
Eosinophils Relative: 3 %
HCT: 30.1 % — ABNORMAL LOW (ref 39.0–52.0)
Hemoglobin: 10.2 g/dL — ABNORMAL LOW (ref 13.0–17.0)
Immature Granulocytes: 1 %
Lymphocytes Relative: 12 %
Lymphs Abs: 0.7 10*3/uL (ref 0.7–4.0)
MCH: 33.3 pg (ref 26.0–34.0)
MCHC: 33.9 g/dL (ref 30.0–36.0)
MCV: 98.4 fL (ref 80.0–100.0)
Monocytes Absolute: 0.9 10*3/uL (ref 0.1–1.0)
Monocytes Relative: 16 %
Neutro Abs: 3.9 10*3/uL (ref 1.7–7.7)
Neutrophils Relative %: 68 %
Platelet Count: 81 10*3/uL — ABNORMAL LOW (ref 150–400)
RBC: 3.06 MIL/uL — ABNORMAL LOW (ref 4.22–5.81)
RDW: 17.9 % — ABNORMAL HIGH (ref 11.5–15.5)
WBC Count: 5.7 10*3/uL (ref 4.0–10.5)
nRBC: 0 % (ref 0.0–0.2)

## 2022-01-23 LAB — CMP (CANCER CENTER ONLY)
ALT: 15 U/L (ref 0–44)
AST: 18 U/L (ref 15–41)
Albumin: 3.9 g/dL (ref 3.5–5.0)
Alkaline Phosphatase: 47 U/L (ref 38–126)
Anion gap: 8 (ref 5–15)
BUN: 38 mg/dL — ABNORMAL HIGH (ref 8–23)
CO2: 23 mmol/L (ref 22–32)
Calcium: 9.8 mg/dL (ref 8.9–10.3)
Chloride: 101 mmol/L (ref 98–111)
Creatinine: 1.01 mg/dL (ref 0.61–1.24)
GFR, Estimated: >60 mL/min (ref >60)
Glucose, Bld: 81 mg/dL (ref 70–99)
Potassium: 4.5 mmol/L (ref 3.5–5.1)
Sodium: 132 mmol/L — ABNORMAL LOW (ref 135–145)
Total Bilirubin: 0.4 mg/dL (ref 0.3–1.2)
Total Protein: 6.9 g/dL (ref 6.5–8.1)

## 2022-01-23 MED ORDER — DEXAMETHASONE 4 MG PO TABS
10.0000 mg | ORAL_TABLET | Freq: Once | ORAL | Status: AC
  Administered 2022-01-23: 10 mg via ORAL
  Filled 2022-01-23: qty 3

## 2022-01-23 MED ORDER — SODIUM CHLORIDE 0.9 % IV SOLN: Freq: Once | INTRAVENOUS | Status: AC

## 2022-01-23 MED ORDER — DEXTROSE 5 % IV SOLN
36.0000 mg/m2 | Freq: Once | INTRAVENOUS | Status: AC
  Administered 2022-01-23: 70 mg via INTRAVENOUS
  Filled 2022-01-23: qty 5

## 2022-01-23 MED ORDER — SODIUM CHLORIDE 0.9 % IV SOLN
250.0000 mg/m2 | Freq: Once | INTRAVENOUS | Status: AC
  Administered 2022-01-23: 500 mg via INTRAVENOUS
  Filled 2022-01-23: qty 25

## 2022-01-23 NOTE — Progress Notes (Signed)
Patient presents for treatment. RN assessment completed along with the following: ? ?Labs/vitals reviewed - Yes, and Platelets 81 ok to treat per Dr Benay Spice    ?Weight within 10% of previous measurement - Yes ?Informed consent completed and reflects current therapy/intent - Yes, on date 12/22/21             ?Provider progress note reviewed -  5 /1/23 ?Treatment/Antibody/Supportive plan reviewed - Yes, and there are no adjustments needed for today's treatment. ?S&H and other orders reviewed - Yes, and there are no additional orders identified. ?Previous treatment date reviewed - Yes, and the appropriate amount of time has elapsed between treatments. ?Clinic Hand Off Received from - Cristy Friedlander, RN ? ?Patient to proceed with treatment.   ?

## 2022-01-23 NOTE — Progress Notes (Signed)
Per Dr. Benay Spice: OK to treat today with platelet count of 81,000. ?

## 2022-01-23 NOTE — Patient Instructions (Signed)
[facility]  ?Discharge Instructions: ?Thank you for choosing [facility] to provide your oncology and hematology care.  ? ?If you have a lab appointment with the [facility], please go directly to the [facility] and check in at the registration area. ?  ?Wear comfortable clothing and clothing appropriate for easy access to any Portacath or PICC line.  ? ?We strive to give you quality time with your provider. You may need to reschedule your appointment if you arrive late (15 or more minutes).  Arriving late affects you and other patients whose appointments are after yours.  Also, if you miss three or more appointments without notifying the office, you may be dismissed from the clinic at the provider?s discretion.    ?  ?For prescription refill requests, have your pharmacy contact our office and allow 72 hours for refills to be completed.   ? ?Today you received the following chemotherapy and/or immunotherapy agents Cytoxan, Kyprolis    ?  ?To help prevent nausea and vomiting after your treatment, we encourage you to take your nausea medication as directed. ? ?BELOW ARE SYMPTOMS THAT SHOULD BE REPORTED IMMEDIATELY: ?*FEVER GREATER THAN 100.4 F (38 ?C) OR HIGHER ?*CHILLS OR SWEATING ?*NAUSEA AND VOMITING THAT IS NOT CONTROLLED WITH YOUR NAUSEA MEDICATION ?*UNUSUAL SHORTNESS OF BREATH ?*UNUSUAL BRUISING OR BLEEDING ?*URINARY PROBLEMS (pain or burning when urinating, or frequent urination) ?*BOWEL PROBLEMS (unusual diarrhea, constipation, pain near the anus) ?TENDERNESS IN MOUTH AND THROAT WITH OR WITHOUT PRESENCE OF ULCERS (sore throat, sores in mouth, or a toothache) ?UNUSUAL RASH, SWELLING OR PAIN  ?UNUSUAL VAGINAL DISCHARGE OR ITCHING  ? ?Items with * indicate a potential emergency and should be followed up as soon as possible or go to the Emergency Department if any problems should occur. ? ?Please show the CHEMOTHERAPY ALERT CARD or IMMUNOTHERAPY ALERT CARD at  check-in to the Emergency Department and triage nurse. ? ?Should you have questions after your visit or need to cancel or reschedule your appointment, please contact [facility]  Dept: [phone]  and follow the prompts.  Office hours are 8:00 a.m. to 4:30 p.m. Monday - Friday. Please note that voicemails left after 4:00 p.m. may not be returned until the following business day.  We are closed weekends and major holidays. You have access to a nurse at all times for urgent questions. Please call the main number to the clinic Dept: [phone] and follow the prompts. ? ? ?For any non-urgent questions, you may also contact your provider using MyChart. We now offer e-Visits for anyone 101 and older to request care online for non-urgent symptoms. For details visit [email or web address]. ?  ?Also download the MyChart app! Go to the app store, search "MyChart", open the app, select [facility], and log in with your MyChart username and password. ? ?Due to Covid, a mask is required upon entering the hospital/clinic. If you do not have a mask, one will be given to you upon arrival. For doctor visits, patients may have 1 support person aged 97 or older with them. For treatment visits, patients cannot have anyone with them due to current Covid guidelines and our immunocompromised population.  ? ?Cyclophosphamide Injection ?What is this medication? ?CYCLOPHOSPHAMIDE (sye kloe FOSS fa mide) is a chemotherapy drug. It slows the growth of cancer cells. This medicine is used to treat many types of cancer like lymphoma, myeloma, leukemia, breast cancer, and ovarian cancer, to name a few. ?This medicine  may be used for other purposes; ask your health care provider or pharmacist if you have questions. ?COMMON BRAND NAME(S): Cyclophosphamide, Cytoxan, Neosar ?What should I tell my care team before I take this medication? ?They need to know if you have any of these conditions: ?heart disease ?history of  irregular heartbeat ?infection ?kidney disease ?liver disease ?low blood counts, like white cells, platelets, or red blood cells ?on hemodialysis ?recent or ongoing radiation therapy ?scarring or thickening of the lungs ?trouble passing urine ?an unusual or allergic reaction to cyclophosphamide, other medicines, foods, dyes, or preservatives ?pregnant or trying to get pregnant ?breast-feeding ?How should I use this medication? ?This drug is usually given as an injection into a vein or muscle or by infusion into a vein. It is administered in a hospital or clinic by a specially trained health care professional. ?Talk to your pediatrician regarding the use of this medicine in children. Special care may be needed. ?Overdosage: If you think you have taken too much of this medicine contact a poison control center or emergency room at once. ?NOTE: This medicine is only for you. Do not share this medicine with others. ?What if I miss a dose? ?It is important not to miss your dose. Call your doctor or health care professional if you are unable to keep an appointment. ?What may interact with this medication? ?amphotericin Derrian ?azathioprine ?certain antivirals for HIV or hepatitis ?certain medicines for blood pressure, heart disease, irregular heart beat ?certain medicines that treat or prevent blood clots like warfarin ?certain other medicines for cancer ?cyclosporine ?etanercept ?indomethacin ?medicines that relax muscles for surgery ?medicines to increase blood counts ?metronidazole ?This list may not describe all possible interactions. Give your health care provider a list of all the medicines, herbs, non-prescription drugs, or dietary supplements you use. Also tell them if you smoke, drink alcohol, or use illegal drugs. Some items may interact with your medicine. ?What should I watch for while using this medication? ?Your condition will be monitored carefully while you are receiving this medicine. ?You may need blood work  done while you are taking this medicine. ?Drink water or other fluids as directed. Urinate often, even at night. ?Some products may contain alcohol. Ask your health care professional if this medicine contains alcohol. Be sure to tell all health care professionals you are taking this medicine. Certain medicines, like metronidazole and disulfiram, can cause an unpleasant reaction when taken with alcohol. The reaction includes flushing, headache, nausea, vomiting, sweating, and increased thirst. The reaction can last from 30 minutes to several hours. ?Do not become pregnant while taking this medicine or for 1 year after stopping it. Women should inform their health care professional if they wish to become pregnant or think they might be pregnant. Men should not father a child while taking this medicine and for 4 months after stopping it. There is potential for serious side effects to an unborn child. Talk to your health care professional for more information. ?Do not breast-feed an infant while taking this medicine or for 1 week after stopping it. ?This medicine has caused ovarian failure in some women. This medicine may make it more difficult to get pregnant. Talk to your health care professional if you are concerned about your fertility. ?This medicine has caused decreased sperm counts in some men. This may make it more difficult to father a child. Talk to your health care professional if you are concerned about your fertility. ?Call your health care professional for advice if you get  a fever, chills, or sore throat, or other symptoms of a cold or flu. Do not treat yourself. This medicine decreases your body's ability to fight infections. Try to avoid being around people who are sick. ?Avoid taking medicines that contain aspirin, acetaminophen, ibuprofen, naproxen, or ketoprofen unless instructed by your health care professional. These medicines may hide a fever. ?Talk to your health care professional about your risk  of cancer. You may be more at risk for certain types of cancer if you take this medicine. ?If you are going to need surgery or other procedure, tell your health care professional that you are using this medici

## 2022-01-29 ENCOUNTER — Other Ambulatory Visit: Payer: Self-pay | Admitting: Oncology

## 2022-01-30 ENCOUNTER — Encounter: Payer: Self-pay | Admitting: *Deleted

## 2022-01-30 ENCOUNTER — Inpatient Hospital Stay: Payer: Medicare Other

## 2022-01-30 ENCOUNTER — Inpatient Hospital Stay (HOSPITAL_BASED_OUTPATIENT_CLINIC_OR_DEPARTMENT_OTHER): Payer: Medicare Other | Admitting: Oncology

## 2022-01-30 VITALS — BP 134/74 | HR 70 | Temp 98.2°F | Resp 18 | Ht 69.0 in | Wt 178.4 lb

## 2022-01-30 DIAGNOSIS — Z9481 Bone marrow transplant status: Secondary | ICD-10-CM

## 2022-01-30 DIAGNOSIS — C9000 Multiple myeloma not having achieved remission: Secondary | ICD-10-CM

## 2022-01-30 DIAGNOSIS — Z5112 Encounter for antineoplastic immunotherapy: Secondary | ICD-10-CM | POA: Diagnosis not present

## 2022-01-30 DIAGNOSIS — T451X5A Adverse effect of antineoplastic and immunosuppressive drugs, initial encounter: Secondary | ICD-10-CM

## 2022-01-30 DIAGNOSIS — G620 Drug-induced polyneuropathy: Secondary | ICD-10-CM

## 2022-01-30 LAB — CBC WITH DIFFERENTIAL (CANCER CENTER ONLY)
Abs Immature Granulocytes: 0.02 10*3/uL (ref 0.00–0.07)
Basophils Absolute: 0 10*3/uL (ref 0.0–0.1)
Basophils Relative: 0 %
Eosinophils Absolute: 0.1 10*3/uL (ref 0.0–0.5)
Eosinophils Relative: 2 %
HCT: 31.9 % — ABNORMAL LOW (ref 39.0–52.0)
Hemoglobin: 10.6 g/dL — ABNORMAL LOW (ref 13.0–17.0)
Immature Granulocytes: 0 %
Lymphocytes Relative: 14 %
Lymphs Abs: 0.7 10*3/uL (ref 0.7–4.0)
MCH: 32.9 pg (ref 26.0–34.0)
MCHC: 33.2 g/dL (ref 30.0–36.0)
MCV: 99.1 fL (ref 80.0–100.0)
Monocytes Absolute: 0.9 10*3/uL (ref 0.1–1.0)
Monocytes Relative: 17 %
Neutro Abs: 3.3 10*3/uL (ref 1.7–7.7)
Neutrophils Relative %: 67 %
Platelet Count: 81 10*3/uL — ABNORMAL LOW (ref 150–400)
RBC: 3.22 MIL/uL — ABNORMAL LOW (ref 4.22–5.81)
RDW: 18.1 % — ABNORMAL HIGH (ref 11.5–15.5)
WBC Count: 5 10*3/uL (ref 4.0–10.5)
nRBC: 0 % (ref 0.0–0.2)

## 2022-01-30 LAB — CMP (CANCER CENTER ONLY)
ALT: 16 U/L (ref 0–44)
AST: 19 U/L (ref 15–41)
Albumin: 4.1 g/dL (ref 3.5–5.0)
Alkaline Phosphatase: 54 U/L (ref 38–126)
Anion gap: 8 (ref 5–15)
BUN: 29 mg/dL — ABNORMAL HIGH (ref 8–23)
CO2: 26 mmol/L (ref 22–32)
Calcium: 10.3 mg/dL (ref 8.9–10.3)
Chloride: 101 mmol/L (ref 98–111)
Creatinine: 1.06 mg/dL (ref 0.61–1.24)
GFR, Estimated: >60 mL/min (ref >60)
Glucose, Bld: 89 mg/dL (ref 70–99)
Potassium: 4.6 mmol/L (ref 3.5–5.1)
Sodium: 135 mmol/L (ref 135–145)
Total Bilirubin: 0.5 mg/dL (ref 0.3–1.2)
Total Protein: 7.6 g/dL (ref 6.5–8.1)

## 2022-01-30 MED ORDER — SODIUM CHLORIDE 0.9 % IV SOLN
250.0000 mg/m2 | Freq: Once | INTRAVENOUS | Status: AC
  Administered 2022-01-30: 500 mg via INTRAVENOUS
  Filled 2022-01-30: qty 25

## 2022-01-30 MED ORDER — DEXAMETHASONE 4 MG PO TABS
10.0000 mg | ORAL_TABLET | Freq: Once | ORAL | Status: AC
  Administered 2022-01-30: 10 mg via ORAL
  Filled 2022-01-30: qty 3

## 2022-01-30 MED ORDER — DEXTROSE 5 % IV SOLN
56.0000 mg/m2 | Freq: Once | INTRAVENOUS | Status: AC
  Administered 2022-01-30: 110 mg via INTRAVENOUS
  Filled 2022-01-30: qty 30

## 2022-01-30 MED ORDER — SODIUM CHLORIDE 0.9 % IV SOLN: Freq: Once | INTRAVENOUS | Status: AC

## 2022-01-30 NOTE — Patient Instructions (Signed)
[facility]   ?Discharge Instructions: ?Thank you for choosing [facility] to provide your oncology and hematology care.  ? ?If you have a lab appointment with the [facility], please go directly to the [facility] and check in at the registration area. ?  ?Wear comfortable clothing and clothing appropriate for easy access to any Portacath or PICC line.  ? ?We strive to give you quality time with your provider. You may need to reschedule your appointment if you arrive late (15 or more minutes).  Arriving late affects you and other patients whose appointments are after yours.  Also, if you miss three or more appointments without notifying the office, you may be dismissed from the clinic at the provider?s discretion.    ?  ?For prescription refill requests, have your pharmacy contact our office and allow 72 hours for refills to be completed.   ? ?Today you received the following chemotherapy and/or immunotherapy agents Cytoxan, Kyprolis    ?  ?To help prevent nausea and vomiting after your treatment, we encourage you to take your nausea medication as directed. ? ?BELOW ARE SYMPTOMS THAT SHOULD BE REPORTED IMMEDIATELY: ?*FEVER GREATER THAN 100.4 F (38 ?C) OR HIGHER ?*CHILLS OR SWEATING ?*NAUSEA AND VOMITING THAT IS NOT CONTROLLED WITH YOUR NAUSEA MEDICATION ?*UNUSUAL SHORTNESS OF BREATH ?*UNUSUAL BRUISING OR BLEEDING ?*URINARY PROBLEMS (pain or burning when urinating, or frequent urination) ?*BOWEL PROBLEMS (unusual diarrhea, constipation, pain near the anus) ?TENDERNESS IN MOUTH AND THROAT WITH OR WITHOUT PRESENCE OF ULCERS (sore throat, sores in mouth, or a toothache) ?UNUSUAL RASH, SWELLING OR PAIN  ?UNUSUAL VAGINAL DISCHARGE OR ITCHING  ? ?Items with * indicate a potential emergency and should be followed up as soon as possible or go to the Emergency Department if any problems should occur. ? ?Please show the CHEMOTHERAPY ALERT CARD or IMMUNOTHERAPY ALERT CARD at  check-in to the Emergency Department and triage nurse. ? ?Should you have questions after your visit or need to cancel or reschedule your appointment, please contact [facility]  Dept: [phone]  and follow the prompts.  Office hours are 8:00 a.m. to 4:30 p.m. Monday - Friday. Please note that voicemails left after 4:00 p.m. may not be returned until the following business day.  We are closed weekends and major holidays. You have access to a nurse at all times for urgent questions. Please call the main number to the clinic Dept: [phone] and follow the prompts. ? ? ?For any non-urgent questions, you may also contact your provider using MyChart. We now offer e-Visits for anyone 85 and older to request care online for non-urgent symptoms. For details visit [email or web address]. ?  ?Also download the MyChart app! Go to the app store, search "MyChart", open the app, select [facility], and log in with your MyChart username and password. ? ?Due to Covid, a mask is required upon entering the hospital/clinic. If you do not have a mask, one will be given to you upon arrival. For doctor visits, patients may have 1 support person aged 58 or older with them. For treatment visits, patients cannot have anyone with them due to current Covid guidelines and our immunocompromised population.  ? ?Cyclophosphamide Injection ?What is this medication? ?CYCLOPHOSPHAMIDE (sye kloe FOSS fa mide) is a chemotherapy drug. It slows the growth of cancer cells. This medicine is used to treat many types of cancer like lymphoma, myeloma, leukemia, breast cancer, and ovarian cancer, to name a few. ?This  medicine may be used for other purposes; ask your health care provider or pharmacist if you have questions. ?COMMON BRAND NAME(S): Cyclophosphamide, Cytoxan, Neosar ?What should I tell my care team before I take this medication? ?They need to know if you have any of these conditions: ?heart disease ?history of  irregular heartbeat ?infection ?kidney disease ?liver disease ?low blood counts, like white cells, platelets, or red blood cells ?on hemodialysis ?recent or ongoing radiation therapy ?scarring or thickening of the lungs ?trouble passing urine ?an unusual or allergic reaction to cyclophosphamide, other medicines, foods, dyes, or preservatives ?pregnant or trying to get pregnant ?breast-feeding ?How should I use this medication? ?This drug is usually given as an injection into a vein or muscle or by infusion into a vein. It is administered in a hospital or clinic by a specially trained health care professional. ?Talk to your pediatrician regarding the use of this medicine in children. Special care may be needed. ?Overdosage: If you think you have taken too much of this medicine contact a poison control center or emergency room at once. ?NOTE: This medicine is only for you. Do not share this medicine with others. ?What if I miss a dose? ?It is important not to miss your dose. Call your doctor or health care professional if you are unable to keep an appointment. ?What may interact with this medication? ?amphotericin Stevon ?azathioprine ?certain antivirals for HIV or hepatitis ?certain medicines for blood pressure, heart disease, irregular heart beat ?certain medicines that treat or prevent blood clots like warfarin ?certain other medicines for cancer ?cyclosporine ?etanercept ?indomethacin ?medicines that relax muscles for surgery ?medicines to increase blood counts ?metronidazole ?This list may not describe all possible interactions. Give your health care provider a list of all the medicines, herbs, non-prescription drugs, or dietary supplements you use. Also tell them if you smoke, drink alcohol, or use illegal drugs. Some items may interact with your medicine. ?What should I watch for while using this medication? ?Your condition will be monitored carefully while you are receiving this medicine. ?You may need blood work  done while you are taking this medicine. ?Drink water or other fluids as directed. Urinate often, even at night. ?Some products may contain alcohol. Ask your health care professional if this medicine contains alcohol. Be sure to tell all health care professionals you are taking this medicine. Certain medicines, like metronidazole and disulfiram, can cause an unpleasant reaction when taken with alcohol. The reaction includes flushing, headache, nausea, vomiting, sweating, and increased thirst. The reaction can last from 30 minutes to several hours. ?Do not become pregnant while taking this medicine or for 1 year after stopping it. Women should inform their health care professional if they wish to become pregnant or think they might be pregnant. Men should not father a child while taking this medicine and for 4 months after stopping it. There is potential for serious side effects to an unborn child. Talk to your health care professional for more information. ?Do not breast-feed an infant while taking this medicine or for 1 week after stopping it. ?This medicine has caused ovarian failure in some women. This medicine may make it more difficult to get pregnant. Talk to your health care professional if you are concerned about your fertility. ?This medicine has caused decreased sperm counts in some men. This may make it more difficult to father a child. Talk to your health care professional if you are concerned about your fertility. ?Call your health care professional for advice if you  get a fever, chills, or sore throat, or other symptoms of a cold or flu. Do not treat yourself. This medicine decreases your body's ability to fight infections. Try to avoid being around people who are sick. ?Avoid taking medicines that contain aspirin, acetaminophen, ibuprofen, naproxen, or ketoprofen unless instructed by your health care professional. These medicines may hide a fever. ?Talk to your health care professional about your risk  of cancer. You may be more at risk for certain types of cancer if you take this medicine. ?If you are going to need surgery or other procedure, tell your health care professional that you are using this medic

## 2022-01-30 NOTE — Progress Notes (Signed)
Patient presents for treatment. RN assessment completed along with the following: ? ?Labs/vitals reviewed - Labs reviewed by Dr. Benay Spice and are not all within treatment parameters. Platelet count 81,000--ok to treat.   ?Weight within 10% of previous measurement - Yes ?Informed consent completed and reflects current therapy/intent - Yes, on date 12/19/21             ?Provider progress note reviewed - Today's provider note is not yet available. I reviewed the most recent oncology provider progress note in chart dated 01/16/22. ?Treatment/Antibody/Supportive plan reviewed - Yes, and . Please note that modifications are being made to the treatment plan including Has increased  carfilzomib to 56 mg/m2. ?S&H and other orders reviewed - Yes, and there are no additional orders identified. ?Previous treatment date reviewed - Yes, and the appropriate amount of time has elapsed between treatments. ?Clinic Hand Off Received from - Manuela Schwartz c, RN ? ?Patient to proceed with treatment.  ? ?

## 2022-01-30 NOTE — Progress Notes (Signed)
Patient seen by Dr. Benay Spice today ? ?Vitals are within treatment parameters. ? ?Labs reviewed by Dr. Benay Spice and are not all within treatment parameters. Platelet count 81,000--ok to treat. ? ?Per physician team, patient is ready for treatment. Please note that modifications are being made to the treatment plan including Has increased  carfilzomib to 56 mg/m2. ?

## 2022-01-30 NOTE — Progress Notes (Signed)
?[facility] ?OFFICE PROGRESS NOTE ? ? ?Diagnosis: Multiple myeloma ? ?INTERVAL HISTORY:  ? ?[name] returns as scheduled.  He is here with his wife.  He continues treatment with Cytoxan/carfilzomib/Decadron.  No rash or sign of allergic reaction.  He has intermittent mild ankle swelling.  He has noted mild mouth sores.  He has been contacted by the transplant service at [facility] and will be discussing CAR-T therapy when he sees Dr. Alvie [name] 02/28/2022. ? ?Objective: ? ?Vital signs in last 24 hours: ? ?Blood pressure 134/74, pulse 70, temperature 98.2 ?F (36.8 ?C), temperature source Oral, resp. rate 18, height 5' 9"  (1.753 m), weight 178 lb 6.4 oz (80.9 kg), SpO2 100 %. ?  ? ?HEENT: No thrush, 2 mm healing ulcer at the right buccal mucosa ?Resp: Lungs clear bilaterally ?Cardio: Regular rate and rhythm ?GI: No hepatosplenomegaly ?Vascular: No leg edema ? ? ?Lab Results: ? ?Lab Results  ?Component Value Date  ? WBC 5.0 01/30/2022  ? HGB 10.6 (L) 01/30/2022  ? HCT 31.9 (L) 01/30/2022  ? MCV 99.1 01/30/2022  ? PLT 81 (L) 01/30/2022  ? NEUTROABS 3.3 01/30/2022  ? ? ?CMP  ?Lab Results  ?Component Value Date  ? NA 135 01/30/2022  ? K 4.6 01/30/2022  ? CL 101 01/30/2022  ? CO2 26 01/30/2022  ? GLUCOSE 89 01/30/2022  ? BUN 29 (H) 01/30/2022  ? CREATININE 1.06 01/30/2022  ? CALCIUM 10.3 01/30/2022  ? PROT 7.6 01/30/2022  ? ALBUMIN 4.1 01/30/2022  ? AST 19 01/30/2022  ? ALT 16 01/30/2022  ? ALKPHOS 54 01/30/2022  ? BILITOT 0.5 01/30/2022  ? GFRNONAA >60 01/30/2022  ? GFRAA >60 06/15/2020  ? ? ?Medications: I have reviewed the patient's current medications. ? ? ?Assessment/Plan: ?Multiple myeloma- IgG lambda ? bortezomib (subcutaneously), lenalidomide, and dexamethasone, with repeat bone marrow biopsy May of 2012 showing 10% plasmacytosis ?  ?(2) high-dose chemotherapy with BCNU and melphalan at [facility], followed by stem cell rescue July of 2012 ?  ?(3) on zoledronic acid started December of 2012, initially  monthly, currently given every 3 months, most recent dose  12/07/2015 ?  ?(4) low-dose lenalidomide resumed April 2013, interrupted several times.  Resumed again on 02/19/2013, ata dose of 5 mg daily, 21 days on and 7 days off, later further reduced to 7 days on, 7 days off ?  ?(5) CNS symptoms and abnormal brain MRI extensively evaluated by neurology with no definitive diagnosis established ?  ?(6) rising M spike noted June 2014 but did not meet criteria for carfilzomib study ?  ?(7) on lenalidomide 25 mg daily, 14 days on, 7 days off, starting 04/18/2013, interrupted December 2014 because of rash; ?            (a) resumed January 2015 at 10 mg/ day at 21 days on/ 7 days off ?            (Conard) starting 08/18/2014 decreased to 10 mg/ day 14 days on, 7 days off because of cytopenias ?            (c) as of February 2016 was on 5 mg daily 7 days on 7 days off ?            (d) lenalidomide discontinued December 2016 with evidence of disease progression ?  ?(8) transient global amnesia 05/29/2015, resolved without intervention ?  ?(9) starting PVD 10/25/2015 w ASA 325 thromboprophylaxis, valacyclovir prophylaxis, last dose 12/17/2015 ?            (  a) pomalidomide 4 mg/d days 1-14 ?            (Adger) bortezomib sQ days 2,5,9,12 of each 21 day cycle ?            (c) dexamethasome 20 mg two days a week ?            (d) dexamethasone bortezomib and pomalidomide discontinued late December 2018 with poor tolerance ?  ?(10) metapneumovirus pneumonia April 2017 ?            (a) completing course of steroids and week of bactrim mid April 2017 ?  ?(11) status post second autologous transplant at [facility] 02/04/2016(preparatory regimen melphalan 200 mg/m?) ?            (a) received twelve-month vaccinations 03/14/2017 (DPT, Haemophilus, Pneumovax 13, polio) ?            (Korion) 14 months injections 05/04/2016 include DTaP, Hib conjugate, HepB energex Joseth 20 mcg/ml, Prevnar 13 ?            (c) 24 month vaccines due at [facility] June 2019 ?  ?(12)  maintenance therapy started November 2017, consisting of ?            (a) bortezomib 1.3 mg/M2 every 14 days, first dose 07/27/2016 ?            (Jonuel) pomalidomide 1 mg days 1-21 Q28 days, started 07/19/2016 ?            (c) zolendronate monthly started 07/27/2016 (previously Q 3 months) however patient unable to tolerate, and changed back to q3 months in April, 2018 ?            (d) Bortezomib changed to monthly as of June 2018 because of tolerance issues, however discontinued after 09/14/2017 dose because of a rise in his M spike ?            (e) pomalidomide held after 10/18/2017 in preparation for possible study at [place] [facility] (venetoclax)--never resumed ?            (f) with numbers actually improved off treatment, resumed every 2-week bortezomib 12/25/2017 ?            (g) changed to every 3-week bortezomib as of 03/17/2019 ?            (h) briefly on weekly treatments times 22 October 2020 due to increase in M spike ?            (I) maintenance therapy discontinued with evidence of progression ?  ?(13) bortezomib/daratumumab/dexamethasone started 12/20/2020. ?            (a) Decadron dose dropped to 10 mg day of and day following treatment starting May 2022 ?            (Tannen) day 8 bortezomib omitted beginning with the June cycle per patient preference ?            (C) treatment changed to monthly daratumumab/Velcade/Decadron beginning 06/27/2021-patient decision ?(14) weekly Velcade/Cytoxan beginning 11/14/2021 ?(15) changed to Cytoxan/carfilzomib/Decadron 12/19/2021 ? ? ? ? ?Disposition: ?Andre [name] is tolerating the current treatment well.  The carfilzomib will be dose escalated to 56 mg per metered squared with treatment beginning today.  We will check a myeloma panel when he returns in 2 weeks.  The platelets are mildly low today.  He will call for bleeding or bruising. ? ?He plans to follow-up with Dr. Alvie [name] next month to discuss CAR-T and other biologic therapies. ? ?  Betsy Coder, MD ? ?01/30/2022  ?10:46  AM ? ? ?

## 2022-02-09 ENCOUNTER — Encounter: Payer: Self-pay | Admitting: Internal Medicine

## 2022-02-09 ENCOUNTER — Other Ambulatory Visit (HOSPITAL_BASED_OUTPATIENT_CLINIC_OR_DEPARTMENT_OTHER): Payer: Self-pay

## 2022-02-09 ENCOUNTER — Ambulatory Visit (INDEPENDENT_AMBULATORY_CARE_PROVIDER_SITE_OTHER): Payer: Medicare Other | Admitting: Internal Medicine

## 2022-02-09 DIAGNOSIS — C9000 Multiple myeloma not having achieved remission: Secondary | ICD-10-CM

## 2022-02-09 DIAGNOSIS — F329 Major depressive disorder, single episode, unspecified: Secondary | ICD-10-CM

## 2022-02-09 DIAGNOSIS — R140 Abdominal distension (gaseous): Secondary | ICD-10-CM | POA: Insufficient documentation

## 2022-02-09 DIAGNOSIS — R530 Neoplastic (malignant) related fatigue: Secondary | ICD-10-CM

## 2022-02-09 DIAGNOSIS — G620 Drug-induced polyneuropathy: Secondary | ICD-10-CM

## 2022-02-09 DIAGNOSIS — R413 Other amnesia: Secondary | ICD-10-CM

## 2022-02-09 DIAGNOSIS — R609 Edema, unspecified: Secondary | ICD-10-CM

## 2022-02-09 DIAGNOSIS — T451X5A Adverse effect of antineoplastic and immunosuppressive drugs, initial encounter: Secondary | ICD-10-CM

## 2022-02-09 MED ORDER — LORAZEPAM 0.5 MG PO TABS
0.5000 mg | ORAL_TABLET | Freq: Every evening | ORAL | 1 refills | Status: DC | PRN
  Filled 2022-02-09: qty 60, 60d supply, fill #0

## 2022-02-09 MED ORDER — ONDANSETRON HCL 8 MG PO TABS
8.0000 mg | ORAL_TABLET | ORAL | 1 refills | Status: DC
  Filled 2022-02-09: qty 30, 10d supply, fill #0
  Filled 2022-02-28: qty 30, 10d supply, fill #1

## 2022-02-09 MED ORDER — PANTOPRAZOLE SODIUM 40 MG PO TBEC
DELAYED_RELEASE_TABLET | ORAL | 3 refills | Status: DC
  Filled 2022-02-09: qty 180, 90d supply, fill #0
  Filled 2022-05-24: qty 180, 90d supply, fill #1
  Filled 2022-08-14: qty 180, 90d supply, fill #2
  Filled 2022-09-21: qty 60, 30d supply, fill #3

## 2022-02-09 MED ORDER — LEVOTHYROXINE SODIUM 150 MCG PO TABS
ORAL_TABLET | ORAL | 3 refills | Status: DC
  Filled 2022-02-09: qty 90, 90d supply, fill #0
  Filled 2022-05-15: qty 90, 90d supply, fill #1
  Filled 2022-06-27 – 2022-08-14 (×2): qty 90, 90d supply, fill #2
  Filled 2022-09-21: qty 30, 30d supply, fill #3

## 2022-02-09 MED ORDER — ESCITALOPRAM OXALATE 10 MG PO TABS
10.0000 mg | ORAL_TABLET | Freq: Every day | ORAL | 1 refills | Status: DC
  Filled 2022-02-09: qty 90, 90d supply, fill #0
  Filled 2022-05-24: qty 90, 90d supply, fill #1

## 2022-02-09 MED ORDER — VALACYCLOVIR HCL 1 G PO TABS
1000.0000 mg | ORAL_TABLET | Freq: Every day | ORAL | 3 refills | Status: DC
  Filled 2022-02-09 – 2022-03-28 (×2): qty 90, 90d supply, fill #0
  Filled 2022-06-27: qty 90, 90d supply, fill #1
  Filled 2022-08-14: qty 90, 90d supply, fill #2
  Filled 2022-09-21: qty 30, 30d supply, fill #2
  Filled 2022-09-22: qty 90, 90d supply, fill #2

## 2022-02-09 NOTE — Assessment & Plan Note (Signed)
Minimal swelling. Cont to watch

## 2022-02-09 NOTE — Assessment & Plan Note (Signed)
New Discussed options Chewing gum may aggravate - stop Gas Ex prn Offered Flagyl + Align - he will let me know

## 2022-02-09 NOTE — Assessment & Plan Note (Signed)
Stress w/cancer and previous marriage Cont w/Lexapro

## 2022-02-09 NOTE — Patient Instructions (Signed)
Try gas ex for gas

## 2022-02-09 NOTE — Assessment & Plan Note (Addendum)
Multifactorial - MCD Keep Neurology appt at [facility] for memory loss

## 2022-02-09 NOTE — Assessment & Plan Note (Signed)
Better  

## 2022-02-09 NOTE — Assessment & Plan Note (Signed)
Per Dr Benay Spice: "He continues treatment with Cytoxan/carfilzomib/Decadron.  No rash or sign of allergic reaction.  He has intermittent mild ankle swelling.  He has noted mild mouth sores.  He has been contacted by the transplant service at [facility] and will be discussing CAR-T therapy when he sees Dr. Alvie Heidelberg 02/28/2022."  Discussed

## 2022-02-09 NOTE — Assessment & Plan Note (Signed)
CFS - multifactorial.  

## 2022-02-09 NOTE — Progress Notes (Signed)
Subjective:  Patient ID: Andre [name], male    DOB: 1950-10-18  Age: 71 y.o. MRN: [id number]  CC: Follow-up   HPI Andre [name] presents for MM, memory issues, anxiety Depression w/stress C/o gas  Outpatient Medications Prior to Visit  Medication Sig Dispense Refill   calcium-vitamin D (OSCAL WITH D) 500-200 MG-UNIT tablet Take 1 tablet by mouth daily.     cetirizine (ZYRTEC) 10 MG tablet Take 10 mg by mouth daily.     Cholecalciferol (VITAMIN D3) 2000 units capsule Take 1 capsule (2,000 Units total) by mouth daily. 100 capsule 3   dexamethasone (DECADRON) 4 MG tablet Take 2.5 tablets (10 mg total) by mouth as directed. Day of treatment and Day after treatment as of 5/22 5 tablet 3   diphenhydrAMINE (BENADRYL) 25 mg capsule Take 2 capsules (50 mg total) by mouth every 6 (six) hours as needed. Takes 2 benadryl at bedtime for itching 30 capsule 0   docusate sodium (COLACE) 100 MG capsule Take 100 mg by mouth daily as needed for mild constipation or moderate constipation.     HYDROCORTISONE ACE, RECTAL, 30 MG SUPP 1 pr bid x 7 d prn 14 suppository 3   hydrocortisone valerate cream (WESTCORT) 0.2 % Apply 1 application topically 2 (two) times daily. 60 g 2   triamcinolone ointment (KENALOG) 0.1 % Apply 1 application topically 2 (two) times daily. 80 g 2   zolendronic acid (ZOMETA) 4 MG/5ML injection Inject 4 mg into the vein every 3 (three) months.     escitalopram (LEXAPRO) 10 MG tablet Take 1 tablet (10 mg total) by mouth daily. 90 tablet 1   levothyroxine (SYNTHROID) 150 MCG tablet TAKE 1 TABLET BY MOUTH EVERY MORNING ON AN EMPTY STOMACH 90 tablet 1   LORazepam (ATIVAN) 0.5 MG tablet Take 1 tablet (0.5 mg total) by mouth at bedtime as needed for anxiety. 20 tablet 0   ondansetron (ZOFRAN) 8 MG tablet Take 1 tablet (8 mg total) by mouth as directed. Take one tablet 1 hour prior to oral cytoxan and every 8 hours as needed for nausea 30 tablet 1   pantoprazole (PROTONIX) 40 MG tablet  TAKE 1 TABLET BY MOUTH TWICE DAILY BEFORE MEALS 180 tablet 3   valACYclovir (VALTREX) 1000 MG tablet Take 1 tablet (1,000 mg total) by mouth daily. Annual appt due in June must see provider for future refills . 90 tablet 0   No facility-administered medications prior to visit.    ROS: Review of Systems  Constitutional:  Positive for fatigue. Negative for appetite change and unexpected weight change.  HENT:  Negative for congestion, nosebleeds, sneezing, sore throat and trouble swallowing.   Eyes:  Negative for itching and visual disturbance.  Respiratory:  Negative for cough.   Cardiovascular:  Negative for chest pain, palpitations and leg swelling.  Gastrointestinal:  Positive for abdominal distention and nausea. Negative for blood in stool, diarrhea and vomiting.  Genitourinary:  Negative for frequency and hematuria.  Musculoskeletal:  Negative for back pain, gait problem, joint swelling and neck pain.  Skin:  Negative for rash.  Neurological:  Negative for dizziness, tremors, speech difficulty and weakness.  Psychiatric/Behavioral:  Negative for agitation, dysphoric mood, sleep disturbance and suicidal ideas. The patient is nervous/anxious.    Objective:  BP 138/74   Pulse 61   Temp 97.7 F (36.5 C) (Oral)   Ht _0  (1.753 m)   Wt 180 lb (81.6 kg)   SpO2 98%   BMI 26.58 kg/m  BP Readings from Last 3 Encounters:  02/09/22 138/74  01/30/22 134/74  01/23/22 134/67    Wt Readings from Last 3 Encounters:  02/09/22 180 lb (81.6 kg)  01/30/22 178 lb 6.4 oz (80.9 kg)  01/23/22 182 lb (82.6 kg)    Physical Exam Constitutional:      General: He is not in acute distress.    Appearance: He is well-developed.     Comments: NAD  Eyes:     Conjunctiva/sclera: Conjunctivae normal.     Pupils: Pupils are equal, round, and reactive to light.  Neck:     Thyroid: No thyromegaly.     Vascular: No JVD.  Cardiovascular:     Rate and Rhythm: Normal rate and regular rhythm.      Heart sounds: Normal heart sounds. No murmur heard.   No friction rub. No gallop.  Pulmonary:     Effort: Pulmonary effort is normal. No respiratory distress.     Breath sounds: Normal breath sounds. No wheezing or rales.  Chest:     Chest wall: No tenderness.  Abdominal:     General: Bowel sounds are normal. There is no distension.     Palpations: Abdomen is soft. There is no mass.     Tenderness: There is no abdominal tenderness. There is no guarding or rebound.  Musculoskeletal:        General: No tenderness. Normal range of motion.     Cervical back: Normal range of motion.  Lymphadenopathy:     Cervical: No cervical adenopathy.  Skin:    General: Skin is warm and dry.     Findings: No rash.  Neurological:     Mental Status: He is alert and oriented to person, place, and time.     Cranial Nerves: No cranial nerve deficit.     Motor: No abnormal muscle tone.     Coordination: Coordination normal.     Gait: Gait normal.     Deep Tendon Reflexes: Reflexes are normal and symmetric.  Psychiatric:        Behavior: Behavior normal.        Thought Content: Thought content normal.        Judgment: Judgment normal.     A total time of 45 minutes was spent preparing to see the patient, reviewing tests, x-rays, operative reports and other medical records.  Also, obtaining history and performing comprehensive physical exam.  Additionally, counseling the patient regarding the above listed issues - MM, depression, gas.   Finally, documenting clinical information in the health records, coordination of care, educating the patient. It is a complex case.  Lab Results  Component Value Date   WBC 5.0 01/30/2022   HGB 10.6 (L) 01/30/2022   HCT 31.9 (L) 01/30/2022   PLT 81 (L) 01/30/2022   GLUCOSE 89 01/30/2022   CHOL 164 09/06/2017   TRIG 194.0 (H) 09/06/2017   HDL 38.60 (L) 09/06/2017   LDLDIRECT 155.8 11/13/2012   LDLCALC 86 09/06/2017   ALT 16 01/30/2022   AST 19 01/30/2022   NA 135  01/30/2022   K 4.6 01/30/2022   CL 101 01/30/2022   CREATININE 1.06 01/30/2022   BUN 29 (H) 01/30/2022   CO2 26 01/30/2022   TSH 0.02 (L) 08/04/2021   PSA 1.76 09/06/2017   INR 1.07 02/27/2016   HGBA1C 5.4 03/27/2012    CT HEAD WO CONTRAST (5MM)  Result Date: 08/10/2021 CLINICAL DATA:  Memory loss.  History of multiple myeloma. EXAM: CT HEAD WITHOUT CONTRAST  TECHNIQUE: Contiguous axial images were obtained from the base of the skull through the vertex without intravenous contrast. COMPARISON:  06/15/2020 FINDINGS: Brain: No evidence of acute infarction, hemorrhage, hydrocephalus, extra-axial collection or mass lesion/mass effect. Small remote left cerebellar infarction. Chronic small vessel ischemia in the hemispheric white matter, mild for age. Generalized cortical volume loss Vascular: No hyperdense vessel or unexpected calcification. Skull: Diffuse heterogeneity of the skull which is likely related to myeloma, and was also seen on prior. No discrete lesion or inner table erosion. Sinuses/Orbits: Negative IMPRESSION: Stable from 2021.  No specific or reversible cause for symptoms. Electronically Signed   By: Jorje Guild M.D.   On: 08/10/2021 11:43    Assessment & Plan:   Problem List Items Addressed This Visit     Reactive depression (situational)    Stress w/cancer and previous marriage Cont w/Lexapro       Relevant Medications   escitalopram (LEXAPRO) 10 MG tablet   LORazepam (ATIVAN) 0.5 MG tablet   Peripheral edema    Minimal swelling. Cont to watch       Neuropathy due to chemotherapeutic drug ([place])    Better        Multiple myeloma not having achieved remission [facility])    Per Dr Benay Spice: "He continues treatment with Cytoxan/carfilzomib/Decadron.  No rash or sign of allergic reaction.  He has intermittent mild ankle swelling.  He has noted mild mouth sores.  He has been contacted by the transplant service at [facility] and will be discussing CAR-T therapy when he sees  Dr. Alvie Heidelberg 02/28/2022."  Discussed      Relevant Medications   LORazepam (ATIVAN) 0.5 MG tablet   ondansetron (ZOFRAN) 8 MG tablet   valACYclovir (VALTREX) 1000 MG tablet   Meteorism    New Discussed options Chewing gum may aggravate - stop Gas Ex prn Offered Flagyl + Align - he will let me know       Memory problem    Multifactorial - MCD Keep Neurology appt at [facility] for memory loss      Fatigue    CFS - multifactorial          Meds ordered this encounter  Medications   escitalopram (LEXAPRO) 10 MG tablet    Sig: Take 1 tablet (10 mg total) by mouth daily.    Dispense:  90 tablet    Refill:  1   levothyroxine (SYNTHROID) 150 MCG tablet    Sig: TAKE 1 TABLET BY MOUTH EVERY MORNING ON AN EMPTY STOMACH    Dispense:  90 tablet    Refill:  3   LORazepam (ATIVAN) 0.5 MG tablet    Sig: Take 1 tablet (0.5 mg total) by mouth at bedtime as needed for anxiety.    Dispense:  60 tablet    Refill:  1   ondansetron (ZOFRAN) 8 MG tablet    Sig: Take 1 tablet (8 mg total) by mouth as directed. Take one tablet 1 hour prior to oral cytoxan and every 8 hours as needed for nausea    Dispense:  30 tablet    Refill:  1   pantoprazole (PROTONIX) 40 MG tablet    Sig: TAKE 1 TABLET BY MOUTH TWICE DAILY BEFORE MEALS    Dispense:  180 tablet    Refill:  3   valACYclovir (VALTREX) 1000 MG tablet    Sig: Take 1 tablet (1,000 mg total) by mouth daily. .    Dispense:  90 tablet    Refill:  3  Follow-up: Return in about 6 months (around 08/12/2022) for Wellness Exam.  [name], MD

## 2022-02-13 ENCOUNTER — Other Ambulatory Visit: Payer: Self-pay | Admitting: Oncology

## 2022-02-14 ENCOUNTER — Encounter: Payer: Self-pay | Admitting: *Deleted

## 2022-02-14 ENCOUNTER — Inpatient Hospital Stay: Payer: Medicare Other

## 2022-02-14 ENCOUNTER — Inpatient Hospital Stay (HOSPITAL_BASED_OUTPATIENT_CLINIC_OR_DEPARTMENT_OTHER): Payer: Medicare Other | Admitting: Oncology

## 2022-02-14 VITALS — BP 135/61 | HR 89 | Temp 98.2°F | Resp 18 | Ht 69.0 in | Wt 182.2 lb

## 2022-02-14 VITALS — BP 122/60 | HR 59

## 2022-02-14 DIAGNOSIS — Z9481 Bone marrow transplant status: Secondary | ICD-10-CM

## 2022-02-14 DIAGNOSIS — G620 Drug-induced polyneuropathy: Secondary | ICD-10-CM

## 2022-02-14 DIAGNOSIS — Z5112 Encounter for antineoplastic immunotherapy: Secondary | ICD-10-CM | POA: Diagnosis not present

## 2022-02-14 DIAGNOSIS — C9000 Multiple myeloma not having achieved remission: Secondary | ICD-10-CM

## 2022-02-14 LAB — CBC WITH DIFFERENTIAL (CANCER CENTER ONLY)
Abs Immature Granulocytes: 0.02 10*3/uL (ref 0.00–0.07)
Basophils Absolute: 0 10*3/uL (ref 0.0–0.1)
Basophils Relative: 1 %
Eosinophils Absolute: 0.1 10*3/uL (ref 0.0–0.5)
Eosinophils Relative: 3 %
HCT: 30.9 % — ABNORMAL LOW (ref 39.0–52.0)
Hemoglobin: 10.2 g/dL — ABNORMAL LOW (ref 13.0–17.0)
Immature Granulocytes: 1 %
Lymphocytes Relative: 14 %
Lymphs Abs: 0.6 10*3/uL — ABNORMAL LOW (ref 0.7–4.0)
MCH: 33.8 pg (ref 26.0–34.0)
MCHC: 33 g/dL (ref 30.0–36.0)
MCV: 102.3 fL — ABNORMAL HIGH (ref 80.0–100.0)
Monocytes Absolute: 0.6 10*3/uL (ref 0.1–1.0)
Monocytes Relative: 15 %
Neutro Abs: 2.9 10*3/uL (ref 1.7–7.7)
Neutrophils Relative %: 66 %
Platelet Count: 91 10*3/uL — ABNORMAL LOW (ref 150–400)
RBC: 3.02 MIL/uL — ABNORMAL LOW (ref 4.22–5.81)
RDW: 19.2 % — ABNORMAL HIGH (ref 11.5–15.5)
WBC Count: 4.4 10*3/uL (ref 4.0–10.5)
nRBC: 0 % (ref 0.0–0.2)

## 2022-02-14 LAB — CMP (CANCER CENTER ONLY)
ALT: 15 U/L (ref 0–44)
AST: 17 U/L (ref 15–41)
Albumin: 4 g/dL (ref 3.5–5.0)
Alkaline Phosphatase: 62 U/L (ref 38–126)
Anion gap: 11 (ref 5–15)
BUN: 31 mg/dL — ABNORMAL HIGH (ref 8–23)
CO2: 20 mmol/L — ABNORMAL LOW (ref 22–32)
Calcium: 9.5 mg/dL (ref 8.9–10.3)
Chloride: 104 mmol/L (ref 98–111)
Creatinine: 1.08 mg/dL (ref 0.61–1.24)
GFR, Estimated: >60 mL/min (ref >60)
Glucose, Bld: 92 mg/dL (ref 70–99)
Potassium: 4.4 mmol/L (ref 3.5–5.1)
Sodium: 135 mmol/L (ref 135–145)
Total Bilirubin: 0.4 mg/dL (ref 0.3–1.2)
Total Protein: 7.5 g/dL (ref 6.5–8.1)

## 2022-02-14 MED ORDER — SODIUM CHLORIDE 0.9 % IV SOLN: Freq: Once | INTRAVENOUS | Status: AC

## 2022-02-14 MED ORDER — DEXTROSE 5 % IV SOLN
56.0000 mg/m2 | Freq: Once | INTRAVENOUS | Status: AC
  Administered 2022-02-14: 110 mg via INTRAVENOUS
  Filled 2022-02-14: qty 30

## 2022-02-14 MED ORDER — DEXAMETHASONE 4 MG PO TABS
10.0000 mg | ORAL_TABLET | Freq: Once | ORAL | Status: AC
  Administered 2022-02-14: 10 mg via ORAL
  Filled 2022-02-14: qty 3

## 2022-02-14 MED ORDER — SODIUM CHLORIDE 0.9 % IV SOLN
250.0000 mg/m2 | Freq: Once | INTRAVENOUS | Status: AC
  Administered 2022-02-14: 500 mg via INTRAVENOUS
  Filled 2022-02-14: qty 25

## 2022-02-14 NOTE — Progress Notes (Signed)
Andre [name] OFFICE PROGRESS NOTE   Diagnosis: Multiple myeloma  INTERVAL HISTORY:   Andre [name] returns as scheduled.  He continues treatment with Cytoxan, carfilzomib, Decadron.  He is tolerating treatment well.  No rash or respiratory symptoms.  He exercises 6 days/week.  No new complaint.  Objective:  Vital signs in last 24 hours:  Blood pressure 135/61, pulse 89, temperature 98.2 F (36.8 C), temperature source Oral, resp. rate 18, height 5' 9"  (1.753 m), weight 182 lb 3.2 oz (82.6 kg), SpO2 99 %.    HEENT: No thrush or ulcers Resp: Lungs clear bilaterally Cardio: Regular rate and rhythm GI: No hepatosplenomegaly Vascular: No leg edema   Portacath/PICC-without erythema  Lab Results:  Lab Results  Component Value Date   WBC 4.4 02/14/2022   HGB 10.2 (L) 02/14/2022   HCT 30.9 (L) 02/14/2022   MCV 102.3 (H) 02/14/2022   PLT 91 (L) 02/14/2022   NEUTROABS 2.9 02/14/2022    CMP  Lab Results  Component Value Date   NA 135 01/30/2022   K 4.6 01/30/2022   CL 101 01/30/2022   CO2 26 01/30/2022   GLUCOSE 89 01/30/2022   BUN 29 (H) 01/30/2022   CREATININE 1.06 01/30/2022   CALCIUM 10.3 01/30/2022   PROT 7.6 01/30/2022   ALBUMIN 4.1 01/30/2022   AST 19 01/30/2022   ALT 16 01/30/2022   ALKPHOS 54 01/30/2022   BILITOT 0.5 01/30/2022   GFRNONAA >60 01/30/2022   GFRAA >60 06/15/2020    Medications: I have reviewed the patient's current medications.   Assessment/Plan: Multiple myeloma- IgG lambda  bortezomib (subcutaneously), lenalidomide, and dexamethasone, with repeat bone marrow biopsy May of 2012 showing 10% plasmacytosis   (2) high-dose chemotherapy with BCNU and melphalan at [facility], followed by stem cell rescue July of 2012   (3) on zoledronic acid started December of 2012, initially monthly, currently given every 3 months, most recent dose  12/07/2015   (4) low-dose lenalidomide resumed April 2013, interrupted several times.  Resumed again  on 02/19/2013, ata dose of 5 mg daily, 21 days on and 7 days off, later further reduced to 7 days on, 7 days off   (5) CNS symptoms and abnormal brain MRI extensively evaluated by neurology with no definitive diagnosis established   (6) rising M spike noted June 2014 but did not meet criteria for carfilzomib study   (7) on lenalidomide 25 mg daily, 14 days on, 7 days off, starting 04/18/2013, interrupted December 2014 because of rash;             (a) resumed January 2015 at 10 mg/ day at 21 days on/ 7 days off             ([place]) starting 08/18/2014 decreased to 10 mg/ day 14 days on, 7 days off because of cytopenias             (c) as of February 2016 was on 5 mg daily 7 days on 7 days off             (d) lenalidomide discontinued December 2016 with evidence of disease progression   (8) transient global amnesia 05/29/2015, resolved without intervention   (9) starting PVD 10/25/2015 w ASA 325 thromboprophylaxis, valacyclovir prophylaxis, last dose 12/17/2015             (a) pomalidomide 4 mg/d days 1-14             (Dhani) bortezomib sQ days 2,5,9,12 of each 21 day cycle             (  c) dexamethasome 20 mg two days a week             (d) dexamethasone bortezomib and pomalidomide discontinued late December 2018 with poor tolerance   (10) metapneumovirus pneumonia April 2017             (a) completing course of steroids and week of bactrim mid April 2017   (11) status post second autologous transplant at [facility] 02/04/2016(preparatory regimen melphalan 200 mg/m)             (a) received twelve-month vaccinations 03/14/2017 (DPT, Haemophilus, Pneumovax 13, polio)             ([place]) 14 months injections 05/04/2016 include DTaP, Hib conjugate, HepB energex Teo 20 mcg/ml, Prevnar 13             (c) 24 month vaccines due at [facility] June 2019   (12) maintenance therapy started November 2017, consisting of             (a) bortezomib 1.3 mg/M2 every 14 days, first dose 07/27/2016             ([place]) pomalidomide 1 mg  days 1-21 Q28 days, started 07/19/2016             (c) zolendronate monthly started 07/27/2016 (previously Q 3 months) however patient unable to tolerate, and changed back to q3 months in April, 2018             (d) Bortezomib changed to monthly as of June 2018 because of tolerance issues, however discontinued after 09/14/2017 dose because of a rise in his M spike             (e) pomalidomide held after 10/18/2017 in preparation for possible study at [facility] (venetoclax)--never resumed             (f) with numbers actually improved off treatment, resumed every 2-week bortezomib 12/25/2017             (g) changed to every 3-week bortezomib as of 03/17/2019             (h) briefly on weekly treatments times 22 October 2020 due to increase in M spike             (I) maintenance therapy discontinued with evidence of progression   (13) bortezomib/daratumumab/dexamethasone started 12/20/2020.             (a) Decadron dose dropped to 10 mg day of and day following treatment starting May 2022             (Nael) day 8 bortezomib omitted beginning with the June cycle per patient preference             (C) treatment changed to monthly daratumumab/Velcade/Decadron beginning 06/27/2021-patient decision (14) weekly Velcade/Cytoxan beginning 11/14/2021 (15) changed to Cytoxan/carfilzomib/Decadron 12/19/2021      Disposition: Andre [name] appears stable.  He is tolerating the Cytoxan, carfilzomib, and Decadron well.  Carfilzomib has been escalated to the full goal dose.  We will follow-up on the myeloma panel from today.  He will complete 3 weeks of systemic therapy beginning today.  He is scheduled to see Dr. Alvie Heidelberg 02/28/2022.  He will return for an office visit, chemotherapy, and Zometa 03/13/2022. Betsy Coder, MD  02/14/2022  9:40 AM

## 2022-02-14 NOTE — Progress Notes (Signed)
Patient seen by Dr. Benay Spice today  Vitals are within treatment parameters.  Labs reviewed by Dr. Benay Spice and are not all within treatment parameters. Platelet count 91,000-- OK to treat  Per physician team, patient is ready for treatment and there are NO modifications to the treatment plan.

## 2022-02-14 NOTE — Patient Instructions (Signed)
[place]   Discharge Instructions: Thank you for choosing [facility] to provide your oncology and hematology care.   If you have a lab appointment with the [facility], please go directly to the [facility] and check in at the registration area.   Wear comfortable clothing and clothing appropriate for easy access to any Portacath or PICC line.   We strive to give you quality time with your provider. You may need to reschedule your appointment if you arrive late (15 or more minutes).  Arriving late affects you and other patients whose appointments are after yours.  Also, if you miss three or more appointments without notifying the office, you may be dismissed from the clinic at the provider's discretion.      For prescription refill requests, have your pharmacy contact our office and allow 72 hours for refills to be completed.    Today you received the following chemotherapy and/or immunotherapy agents Cytoxan, Kyprolis      To help prevent nausea and vomiting after your treatment, we encourage you to take your nausea medication as directed.  BELOW ARE SYMPTOMS THAT SHOULD BE REPORTED IMMEDIATELY: *FEVER GREATER THAN 100.4 F (38 C) OR HIGHER *CHILLS OR SWEATING *NAUSEA AND VOMITING THAT IS NOT CONTROLLED WITH YOUR NAUSEA MEDICATION *UNUSUAL SHORTNESS OF BREATH *UNUSUAL BRUISING OR BLEEDING *URINARY PROBLEMS (pain or burning when urinating, or frequent urination) *BOWEL PROBLEMS (unusual diarrhea, constipation, pain near the anus) TENDERNESS IN MOUTH AND THROAT WITH OR WITHOUT PRESENCE OF ULCERS (sore throat, sores in mouth, or a toothache) UNUSUAL RASH, SWELLING OR PAIN  UNUSUAL VAGINAL DISCHARGE OR ITCHING   Items with * indicate a potential emergency and should be followed up as soon as possible or go to the Emergency Department if any problems should occur.  Please show the CHEMOTHERAPY ALERT CARD or IMMUNOTHERAPY ALERT CARD at  check-in to the Emergency Department and triage nurse.  Should you have questions after your visit or need to cancel or reschedule your appointment, please contact [facility]  Dept: [phone]  and follow the prompts.  Office hours are 8:00 a.m. to 4:30 p.m. Monday - Friday. Please note that voicemails left after 4:00 p.m. may not be returned until the following business day.  We are closed weekends and major holidays. You have access to a nurse at all times for urgent questions. Please call the main number to the clinic Dept: [phone] and follow the prompts.   For any non-urgent questions, you may also contact your provider using MyChart. We now offer e-Visits for anyone 64 and older to request care online for non-urgent symptoms. For details visit [email or web address].   Also download the MyChart app! Go to the app store, search "MyChart", open the app, select [facility], and log in with your MyChart username and password.  Due to Covid, a mask is required upon entering the hospital/clinic. If you do not have a mask, one will be given to you upon arrival. For doctor visits, patients may have 1 support person aged 85 or older with them. For treatment visits, patients cannot have anyone with them due to current Covid guidelines and our immunocompromised population.   Cyclophosphamide Injection What is this medication? CYCLOPHOSPHAMIDE (sye kloe FOSS fa mide) is a chemotherapy drug. It slows the growth of cancer cells. This medicine is used to treat many types of cancer like lymphoma, myeloma, leukemia, breast cancer, and ovarian cancer, to name a few. This  medicine may be used for other purposes; ask your health care provider or pharmacist if you have questions. COMMON BRAND NAME(S): Cyclophosphamide, Cytoxan, Neosar What should I tell my care team before I take this medication? They need to know if you have any of these conditions: heart disease history of  irregular heartbeat infection kidney disease liver disease low blood counts, like white cells, platelets, or red blood cells on hemodialysis recent or ongoing radiation therapy scarring or thickening of the lungs trouble passing urine an unusual or allergic reaction to cyclophosphamide, other medicines, foods, dyes, or preservatives pregnant or trying to get pregnant breast-feeding How should I use this medication? This drug is usually given as an injection into a vein or muscle or by infusion into a vein. It is administered in a hospital or clinic by a specially trained health care professional. Talk to your pediatrician regarding the use of this medicine in children. Special care may be needed. Overdosage: If you think you have taken too much of this medicine contact a poison control center or emergency room at once. NOTE: This medicine is only for you. Do not share this medicine with others. What if I miss a dose? It is important not to miss your dose. Call your doctor or health care professional if you are unable to keep an appointment. What may interact with this medication? amphotericin Veasna azathioprine certain antivirals for HIV or hepatitis certain medicines for blood pressure, heart disease, irregular heart beat certain medicines that treat or prevent blood clots like warfarin certain other medicines for cancer cyclosporine etanercept indomethacin medicines that relax muscles for surgery medicines to increase blood counts metronidazole This list may not describe all possible interactions. Give your health care provider a list of all the medicines, herbs, non-prescription drugs, or dietary supplements you use. Also tell them if you smoke, drink alcohol, or use illegal drugs. Some items may interact with your medicine. What should I watch for while using this medication? Your condition will be monitored carefully while you are receiving this medicine. You may need blood work  done while you are taking this medicine. Drink water or other fluids as directed. Urinate often, even at night. Some products may contain alcohol. Ask your health care professional if this medicine contains alcohol. Be sure to tell all health care professionals you are taking this medicine. Certain medicines, like metronidazole and disulfiram, can cause an unpleasant reaction when taken with alcohol. The reaction includes flushing, headache, nausea, vomiting, sweating, and increased thirst. The reaction can last from 30 minutes to several hours. Do not become pregnant while taking this medicine or for 1 year after stopping it. Women should inform their health care professional if they wish to become pregnant or think they might be pregnant. Men should not father a child while taking this medicine and for 4 months after stopping it. There is potential for serious side effects to an unborn child. Talk to your health care professional for more information. Do not breast-feed an infant while taking this medicine or for 1 week after stopping it. This medicine has caused ovarian failure in some women. This medicine may make it more difficult to get pregnant. Talk to your health care professional if you are concerned about your fertility. This medicine has caused decreased sperm counts in some men. This may make it more difficult to father a child. Talk to your health care professional if you are concerned about your fertility. Call your health care professional for advice if you  get a fever, chills, or sore throat, or other symptoms of a cold or flu. Do not treat yourself. This medicine decreases your body's ability to fight infections. Try to avoid being around people who are sick. Avoid taking medicines that contain aspirin, acetaminophen, ibuprofen, naproxen, or ketoprofen unless instructed by your health care professional. These medicines may hide a fever. Talk to your health care professional about your risk  of cancer. You may be more at risk for certain types of cancer if you take this medicine. If you are going to need surgery or other procedure, tell your health care professional that you are using this medicine. Be careful brushing or flossing your teeth or using a toothpick because you may get an infection or bleed more easily. If you have any dental work done, tell your dentist you are receiving this medicine. What side effects may I notice from receiving this medication? Side effects that you should report to your doctor or health care professional as soon as possible: allergic reactions like skin rash, itching or hives, swelling of the face, lips, or tongue breathing problems nausea, vomiting signs and symptoms of bleeding such as bloody or black, tarry stools; red or dark brown urine; spitting up blood or brown material that looks like coffee grounds; red spots on the skin; unusual bruising or bleeding from the eyes, gums, or nose signs and symptoms of heart failure like fast, irregular heartbeat, sudden weight gain; swelling of the ankles, feet, hands signs and symptoms of infection like fever; chills; cough; sore throat; pain or trouble passing urine signs and symptoms of kidney injury like trouble passing urine or change in the amount of urine signs and symptoms of liver injury like dark yellow or brown urine; general ill feeling or flu-like symptoms; light-colored stools; loss of appetite; nausea; right upper belly pain; unusually weak or tired; yellowing of the eyes or skin Side effects that usually do not require medical attention (report to your doctor or health care professional if they continue or are bothersome): confusion decreased hearing diarrhea facial flushing hair loss headache loss of appetite missed menstrual periods signs and symptoms of low red blood cells or anemia such as unusually weak or tired; feeling faint or lightheaded; falls skin discoloration This list may  not describe all possible side effects. Call your doctor for medical advice about side effects. You may report side effects to FDA at 1-800-FDA-1088. Where should I keep my medication? This drug is given in a hospital or clinic and will not be stored at home. NOTE: This sheet is a summary. It may not cover all possible information. If you have questions about this medicine, talk to your doctor, pharmacist, or health care provider.  2023 Elsevier/Gold Standard (2021-08-05 00:00:00)  Carfilzomib injection What is this medication? CARFILZOMIB (kar FILZ oh mib) targets a specific protein within cancer cells and stops the cancer cells from growing. It is used to treat multiple myeloma. This medicine may be used for other purposes; ask your health care provider or pharmacist if you have questions. COMMON BRAND NAME(S): KYPROLIS What should I tell my care team before I take this medication? They need to know if you have any of these conditions: heart disease history of blood clots irregular heartbeat kidney disease liver disease lung or breathing disease an unusual or allergic reaction to carfilzomib, or other medicines, foods, dyes, or preservatives pregnant or trying to get pregnant breast-feeding How should I use this medication? This medicine is for injection or infusion  into a vein. It is given by a health care professional in a hospital or clinic setting. Talk to your pediatrician regarding the use of this medicine in children. Special care may be needed. Overdosage: If you think you have taken too much of this medicine contact a poison control center or emergency room at once. NOTE: This medicine is only for you. Do not share this medicine with others. What if I miss a dose? It is important not to miss your dose. Call your doctor or health care professional if you are unable to keep an appointment. What may interact with this medication? Interactions are not expected. This list may  not describe all possible interactions. Give your health care provider a list of all the medicines, herbs, non-prescription drugs, or dietary supplements you use. Also tell them if you smoke, drink alcohol, or use illegal drugs. Some items may interact with your medicine. What should I watch for while using this medication? Your condition will be monitored while you are receiving this medicine. You may need blood work done while you are taking this medicine. Do not become pregnant while taking this medicine or for 6 months after stopping it. Women should inform their health care provider if they wish to become pregnant or think they might be pregnant. Men should not father a child while taking this medicine and for 3 months after stopping it. There is a potential for serious side effects to an unborn child. Talk to your health care provider for more information. Do not breast-feed an infant while taking this medicine or for 2 weeks after stopping it. Check with your health care provider if you have severe diarrhea, nausea, and vomiting, or if you sweat a lot. The loss of too much body fluid may make it dangerous for you to take this medicine. You may get drowsy or dizzy. Do not drive, use machinery, or do anything that needs mental alertness until you know how this medicine affects you. Do not stand up or sit up quickly, especially if you are an older patient. This reduces the risk of dizzy or fainting spells. What side effects may I notice from receiving this medication? Side effects that you should report to your doctor or health care professional as soon as possible: allergic reactions like skin rash, itching or hives, swelling of the face, lips, or tongue confusion dizziness feeling faint or lightheaded fever or chills palpitations seizures signs and symptoms of bleeding such as bloody or black, tarry stools; red or dark-brown urine; spitting up blood or brown material that looks like coffee  grounds; red spots on the skin; unusual bruising or bleeding including from the eye, gums, or nose signs and symptoms of a blood clot such as breathing problems; changes in vision; chest pain; severe, sudden headache; pain, swelling, warmth in the leg; trouble speaking; sudden numbness or weakness of the face, arm or leg signs and symptoms of kidney injury like trouble passing urine or change in the amount of urine signs and symptoms of liver injury like dark yellow or brown urine; general ill feeling or flu-like symptoms; light-colored stools; loss of appetite; nausea; right upper belly pain; unusually weak or tired; yellowing of the eyes or skin Side effects that usually do not require medical attention (report to your doctor or health care professional if they continue or are bothersome): back pain cough diarrhea headache muscle cramps trouble sleeping vomiting This list may not describe all possible side effects. Call your doctor for medical advice  about side effects. You may report side effects to FDA at 1-800-FDA-1088. Where should I keep my medication? This drug is given in a hospital or clinic and will not be stored at home. NOTE: This sheet is a summary. It may not cover all possible information. If you have questions about this medicine, talk to your doctor, pharmacist, or health care provider.  2023 Elsevier/Gold Standard (2020-10-14 00:00:00)

## 2022-02-15 LAB — KAPPA/LAMBDA LIGHT CHAINS
Kappa free light chain: 10.5 mg/L (ref 3.3–19.4)
Kappa, lambda light chain ratio: 0.06 — ABNORMAL LOW (ref 0.26–1.65)
Lambda free light chains: 163.5 mg/L — ABNORMAL HIGH (ref 5.7–26.3)

## 2022-02-15 LAB — IGG: IgG (Immunoglobin G), Serum: 1360 mg/dL (ref 603–1613)

## 2022-02-16 LAB — PROTEIN ELECTROPHORESIS, SERUM
A/G Ratio: 1.2 (ref 0.7–1.7)
Albumin ELP: 3.7 g/dL (ref 2.9–4.4)
Alpha-1-Globulin: 0.2 g/dL (ref 0.0–0.4)
Alpha-2-Globulin: 0.8 g/dL (ref 0.4–1.0)
Beta Globulin: 1 g/dL (ref 0.7–1.3)
Gamma Globulin: 1.2 g/dL (ref 0.4–1.8)
Globulin, Total: 3.2 g/dL (ref 2.2–3.9)
M-Spike, %: 1.1 g/dL — ABNORMAL HIGH
Total Protein ELP: 6.9 g/dL (ref 6.0–8.5)

## 2022-02-20 ENCOUNTER — Inpatient Hospital Stay: Payer: Medicare Other

## 2022-02-20 ENCOUNTER — Inpatient Hospital Stay: Payer: Medicare Other | Attending: Oncology

## 2022-02-20 VITALS — BP 85/71 | HR 60 | Temp 98.1°F | Resp 18 | Ht 69.0 in | Wt 176.4 lb

## 2022-02-20 DIAGNOSIS — Z5112 Encounter for antineoplastic immunotherapy: Secondary | ICD-10-CM | POA: Diagnosis present

## 2022-02-20 DIAGNOSIS — Z79899 Other long term (current) drug therapy: Secondary | ICD-10-CM | POA: Diagnosis not present

## 2022-02-20 DIAGNOSIS — T451X5A Adverse effect of antineoplastic and immunosuppressive drugs, initial encounter: Secondary | ICD-10-CM

## 2022-02-20 DIAGNOSIS — Z9481 Bone marrow transplant status: Secondary | ICD-10-CM

## 2022-02-20 DIAGNOSIS — C9000 Multiple myeloma not having achieved remission: Secondary | ICD-10-CM | POA: Diagnosis present

## 2022-02-20 DIAGNOSIS — Z5111 Encounter for antineoplastic chemotherapy: Secondary | ICD-10-CM | POA: Insufficient documentation

## 2022-02-20 LAB — CBC WITH DIFFERENTIAL (CANCER CENTER ONLY)
Abs Immature Granulocytes: 0.01 10*3/uL (ref 0.00–0.07)
Basophils Absolute: 0 10*3/uL (ref 0.0–0.1)
Basophils Relative: 0 %
Eosinophils Absolute: 0.1 10*3/uL (ref 0.0–0.5)
Eosinophils Relative: 3 %
HCT: 29.8 % — ABNORMAL LOW (ref 39.0–52.0)
Hemoglobin: 9.9 g/dL — ABNORMAL LOW (ref 13.0–17.0)
Immature Granulocytes: 0 %
Lymphocytes Relative: 13 %
Lymphs Abs: 0.5 10*3/uL — ABNORMAL LOW (ref 0.7–4.0)
MCH: 33.6 pg (ref 26.0–34.0)
MCHC: 33.2 g/dL (ref 30.0–36.0)
MCV: 101 fL — ABNORMAL HIGH (ref 80.0–100.0)
Monocytes Absolute: 1.1 10*3/uL — ABNORMAL HIGH (ref 0.1–1.0)
Monocytes Relative: 28 %
Neutro Abs: 2.2 10*3/uL (ref 1.7–7.7)
Neutrophils Relative %: 56 %
Platelet Count: 72 10*3/uL — ABNORMAL LOW (ref 150–400)
RBC: 2.95 MIL/uL — ABNORMAL LOW (ref 4.22–5.81)
RDW: 18.7 % — ABNORMAL HIGH (ref 11.5–15.5)
WBC Count: 3.9 10*3/uL — ABNORMAL LOW (ref 4.0–10.5)
nRBC: 0 % (ref 0.0–0.2)

## 2022-02-20 MED ORDER — SODIUM CHLORIDE 0.9 % IV SOLN: Freq: Once | INTRAVENOUS | Status: AC

## 2022-02-20 MED ORDER — DEXTROSE 5 % IV SOLN
56.0000 mg/m2 | Freq: Once | INTRAVENOUS | Status: AC
  Administered 2022-02-20: 110 mg via INTRAVENOUS
  Filled 2022-02-20: qty 15

## 2022-02-20 MED ORDER — DEXAMETHASONE 4 MG PO TABS
ORAL_TABLET | ORAL | Status: AC
  Filled 2022-02-20: qty 3

## 2022-02-20 MED ORDER — DEXAMETHASONE 4 MG PO TABS
10.0000 mg | ORAL_TABLET | Freq: Once | ORAL | Status: AC
  Administered 2022-02-20: 10 mg via ORAL

## 2022-02-20 MED ORDER — SODIUM CHLORIDE 0.9 % IV SOLN
250.0000 mg/m2 | Freq: Once | INTRAVENOUS | Status: AC
  Administered 2022-02-20: 500 mg via INTRAVENOUS
  Filled 2022-02-20: qty 25

## 2022-02-20 NOTE — Progress Notes (Signed)
Per Dr. Benay Spice: Does not require CMP today for treatment. OK to treat with platelet count of 72,000.

## 2022-02-26 ENCOUNTER — Other Ambulatory Visit: Payer: Self-pay | Admitting: Oncology

## 2022-02-27 ENCOUNTER — Inpatient Hospital Stay: Payer: Medicare Other

## 2022-02-27 ENCOUNTER — Other Ambulatory Visit: Payer: Self-pay

## 2022-02-27 VITALS — BP 128/57 | HR 65 | Temp 97.8°F | Resp 18 | Ht 69.0 in | Wt 181.2 lb

## 2022-02-27 DIAGNOSIS — Z9481 Bone marrow transplant status: Secondary | ICD-10-CM

## 2022-02-27 DIAGNOSIS — Z5112 Encounter for antineoplastic immunotherapy: Secondary | ICD-10-CM | POA: Diagnosis not present

## 2022-02-27 DIAGNOSIS — T451X5A Adverse effect of antineoplastic and immunosuppressive drugs, initial encounter: Secondary | ICD-10-CM

## 2022-02-27 DIAGNOSIS — C9000 Multiple myeloma not having achieved remission: Secondary | ICD-10-CM

## 2022-02-27 LAB — CBC WITH DIFFERENTIAL (CANCER CENTER ONLY)
Abs Immature Granulocytes: 0.02 10*3/uL (ref 0.00–0.07)
Basophils Absolute: 0 10*3/uL (ref 0.0–0.1)
Basophils Relative: 0 %
Eosinophils Absolute: 0.1 10*3/uL (ref 0.0–0.5)
Eosinophils Relative: 3 %
HCT: 30.3 % — ABNORMAL LOW (ref 39.0–52.0)
Hemoglobin: 10.1 g/dL — ABNORMAL LOW (ref 13.0–17.0)
Immature Granulocytes: 1 %
Lymphocytes Relative: 9 %
Lymphs Abs: 0.4 10*3/uL — ABNORMAL LOW (ref 0.7–4.0)
MCH: 34.6 pg — ABNORMAL HIGH (ref 26.0–34.0)
MCHC: 33.3 g/dL (ref 30.0–36.0)
MCV: 103.8 fL — ABNORMAL HIGH (ref 80.0–100.0)
Monocytes Absolute: 0.8 10*3/uL (ref 0.1–1.0)
Monocytes Relative: 19 %
Neutro Abs: 2.9 10*3/uL (ref 1.7–7.7)
Neutrophils Relative %: 68 %
Platelet Count: 67 10*3/uL — ABNORMAL LOW (ref 150–400)
RBC: 2.92 MIL/uL — ABNORMAL LOW (ref 4.22–5.81)
RDW: 18.6 % — ABNORMAL HIGH (ref 11.5–15.5)
WBC Count: 4.2 10*3/uL (ref 4.0–10.5)
nRBC: 0.5 % — ABNORMAL HIGH (ref 0.0–0.2)

## 2022-02-27 MED ORDER — SODIUM CHLORIDE 0.9 % IV SOLN: Freq: Once | INTRAVENOUS | Status: AC

## 2022-02-27 MED ORDER — SODIUM CHLORIDE 0.9 % IV SOLN: Freq: Once | INTRAVENOUS | Status: DC

## 2022-02-27 MED ORDER — DEXAMETHASONE 4 MG PO TABS
10.0000 mg | ORAL_TABLET | Freq: Once | ORAL | Status: AC
  Administered 2022-02-27: 10 mg via ORAL
  Filled 2022-02-27: qty 3

## 2022-02-27 MED ORDER — SODIUM CHLORIDE 0.9 % IV SOLN
250.0000 mg/m2 | Freq: Once | INTRAVENOUS | Status: AC
  Administered 2022-02-27: 500 mg via INTRAVENOUS
  Filled 2022-02-27: qty 25

## 2022-02-27 MED ORDER — DEXTROSE 5 % IV SOLN
56.0000 mg/m2 | Freq: Once | INTRAVENOUS | Status: AC
  Administered 2022-02-27: 110 mg via INTRAVENOUS
  Filled 2022-02-27: qty 15

## 2022-02-27 NOTE — Progress Notes (Signed)
   Labs reviewed by Dr. Waldon Merl reviewed with Dr Benay Spice okay to treat with CMP 6/5 and Platelet count today of 67  Per physician team, patient is ready for treatment and there are NO modifications to the treatment plan.

## 2022-02-27 NOTE — Patient Instructions (Signed)
[facility]   Discharge Instructions: Thank you for choosing [facility] to provide your oncology and hematology care.   If you have a lab appointment with the Cancer Center, please go directly to the Cancer Center and check in at the registration area.   Wear comfortable clothing and clothing appropriate for easy access to any Portacath or PICC line.   We strive to give you quality time with your provider. You may need to reschedule your appointment if you arrive late (15 or more minutes).  Arriving late affects you and other patients whose appointments are after yours.  Also, if you miss three or more appointments without notifying the office, you may be dismissed from the clinic at the provider's discretion.      For prescription refill requests, have your pharmacy contact our office and allow 72 hours for refills to be completed.    Today you received the following chemotherapy and/or immunotherapy agents Cytoxan, Kyprolis      To help prevent nausea and vomiting after your treatment, we encourage you to take your nausea medication as directed.  BELOW ARE SYMPTOMS THAT SHOULD BE REPORTED IMMEDIATELY: *FEVER GREATER THAN 100.4 F (38 C) OR HIGHER *CHILLS OR SWEATING *NAUSEA AND VOMITING THAT IS NOT CONTROLLED WITH YOUR NAUSEA MEDICATION *UNUSUAL SHORTNESS OF BREATH *UNUSUAL BRUISING OR BLEEDING *URINARY PROBLEMS (pain or burning when urinating, or frequent urination) *BOWEL PROBLEMS (unusual diarrhea, constipation, pain near the anus) TENDERNESS IN MOUTH AND THROAT WITH OR WITHOUT PRESENCE OF ULCERS (sore throat, sores in mouth, or a toothache) UNUSUAL RASH, SWELLING OR PAIN  UNUSUAL VAGINAL DISCHARGE OR ITCHING   Items with * indicate a potential emergency and should be followed up as soon as possible or go to the Emergency Department if any problems should occur.  Please show the CHEMOTHERAPY ALERT CARD or IMMUNOTHERAPY ALERT CARD at  check-in to the Emergency Department and triage nurse.  Should you have questions after your visit or need to cancel or reschedule your appointment, please contact [facility]  Dept: [phone]  and follow the prompts.  Office hours are 8:00 a.m. to 4:30 p.m. Monday - Friday. Please note that voicemails left after 4:00 p.m. may not be returned until the following business day.  We are closed weekends and major holidays. You have access to a nurse at all times for urgent questions. Please call the main number to the clinic Dept: [phone] and follow the prompts.   For any non-urgent questions, you may also contact your provider using MyChart. We now offer e-Visits for anyone 18 and older to request care online for non-urgent symptoms. For details visit [email or web address].   Also download the MyChart app! Go to the app store, search "MyChart", open the app, select [facility], and log in with your MyChart username and password.  Due to Covid, a mask is required upon entering the hospital/clinic. If you do not have a mask, one will be given to you upon arrival. For doctor visits, patients may have 1 support person aged 18 or older with them. For treatment visits, patients cannot have anyone with them due to current Covid guidelines and our immunocompromised population.   Cyclophosphamide Injection What is this medication? CYCLOPHOSPHAMIDE (sye kloe FOSS fa mide) is a chemotherapy drug. It slows the growth of cancer cells. This medicine is used to treat many types of cancer like lymphoma, myeloma, leukemia, breast cancer, and ovarian cancer, to name a few. This   medicine may be used for other purposes; ask your health care provider or pharmacist if you have questions. COMMON BRAND NAME(S): Cyclophosphamide, Cytoxan, Neosar What should I tell my care team before I take this medication? They need to know if you have any of these conditions: heart disease history of  irregular heartbeat infection kidney disease liver disease low blood counts, like white cells, platelets, or red blood cells on hemodialysis recent or ongoing radiation therapy scarring or thickening of the lungs trouble passing urine an unusual or allergic reaction to cyclophosphamide, other medicines, foods, dyes, or preservatives pregnant or trying to get pregnant breast-feeding How should I use this medication? This drug is usually given as an injection into a vein or muscle or by infusion into a vein. It is administered in a hospital or clinic by a specially trained health care professional. Talk to your pediatrician regarding the use of this medicine in children. Special care may be needed. Overdosage: If you think you have taken too much of this medicine contact a poison control center or emergency room at once. NOTE: This medicine is only for you. Do not share this medicine with others. What if I miss a dose? It is important not to miss your dose. Call your doctor or health care professional if you are unable to keep an appointment. What may interact with this medication? amphotericin Tylik azathioprine certain antivirals for HIV or hepatitis certain medicines for blood pressure, heart disease, irregular heart beat certain medicines that treat or prevent blood clots like warfarin certain other medicines for cancer cyclosporine etanercept indomethacin medicines that relax muscles for surgery medicines to increase blood counts metronidazole This list may not describe all possible interactions. Give your health care provider a list of all the medicines, herbs, non-prescription drugs, or dietary supplements you use. Also tell them if you smoke, drink alcohol, or use illegal drugs. Some items may interact with your medicine. What should I watch for while using this medication? Your condition will be monitored carefully while you are receiving this medicine. You may need blood work  done while you are taking this medicine. Drink water or other fluids as directed. Urinate often, even at night. Some products may contain alcohol. Ask your health care professional if this medicine contains alcohol. Be sure to tell all health care professionals you are taking this medicine. Certain medicines, like metronidazole and disulfiram, can cause an unpleasant reaction when taken with alcohol. The reaction includes flushing, headache, nausea, vomiting, sweating, and increased thirst. The reaction can last from 30 minutes to several hours. Do not become pregnant while taking this medicine or for 1 year after stopping it. Women should inform their health care professional if they wish to become pregnant or think they might be pregnant. Men should not father a child while taking this medicine and for 4 months after stopping it. There is potential for serious side effects to an unborn child. Talk to your health care professional for more information. Do not breast-feed an infant while taking this medicine or for 1 week after stopping it. This medicine has caused ovarian failure in some women. This medicine may make it more difficult to get pregnant. Talk to your health care professional if you are concerned about your fertility. This medicine has caused decreased sperm counts in some men. This may make it more difficult to father a child. Talk to your health care professional if you are concerned about your fertility. Call your health care professional for advice if you   get a fever, chills, or sore throat, or other symptoms of a cold or flu. Do not treat yourself. This medicine decreases your body's ability to fight infections. Try to avoid being around people who are sick. Avoid taking medicines that contain aspirin, acetaminophen, ibuprofen, naproxen, or ketoprofen unless instructed by your health care professional. These medicines may hide a fever. Talk to your health care professional about your risk  of cancer. You may be more at risk for certain types of cancer if you take this medicine. If you are going to need surgery or other procedure, tell your health care professional that you are using this medicine. Be careful brushing or flossing your teeth or using a toothpick because you may get an infection or bleed more easily. If you have any dental work done, tell your dentist you are receiving this medicine. What side effects may I notice from receiving this medication? Side effects that you should report to your doctor or health care professional as soon as possible: allergic reactions like skin rash, itching or hives, swelling of the face, lips, or tongue breathing problems nausea, vomiting signs and symptoms of bleeding such as bloody or black, tarry stools; red or dark brown urine; spitting up blood or brown material that looks like coffee grounds; red spots on the skin; unusual bruising or bleeding from the eyes, gums, or nose signs and symptoms of heart failure like fast, irregular heartbeat, sudden weight gain; swelling of the ankles, feet, hands signs and symptoms of infection like fever; chills; cough; sore throat; pain or trouble passing urine signs and symptoms of kidney injury like trouble passing urine or change in the amount of urine signs and symptoms of liver injury like dark yellow or brown urine; general ill feeling or flu-like symptoms; light-colored stools; loss of appetite; nausea; right upper belly pain; unusually weak or tired; yellowing of the eyes or skin Side effects that usually do not require medical attention (report to your doctor or health care professional if they continue or are bothersome): confusion decreased hearing diarrhea facial flushing hair loss headache loss of appetite missed menstrual periods signs and symptoms of low red blood cells or anemia such as unusually weak or tired; feeling faint or lightheaded; falls skin discoloration This list may  not describe all possible side effects. Call your doctor for medical advice about side effects. You may report side effects to FDA at 1-800-FDA-1088. Where should I keep my medication? This drug is given in a hospital or clinic and will not be stored at home. NOTE: This sheet is a summary. It may not cover all possible information. If you have questions about this medicine, talk to your doctor, pharmacist, or health care provider.  2023 Elsevier/Gold Standard (2021-08-05 00:00:00)  Carfilzomib injection What is this medication? CARFILZOMIB (kar FILZ oh mib) targets a specific protein within cancer cells and stops the cancer cells from growing. It is used to treat multiple myeloma. This medicine may be used for other purposes; ask your health care provider or pharmacist if you have questions. COMMON BRAND NAME(S): KYPROLIS What should I tell my care team before I take this medication? They need to know if you have any of these conditions: heart disease history of blood clots irregular heartbeat kidney disease liver disease lung or breathing disease an unusual or allergic reaction to carfilzomib, or other medicines, foods, dyes, or preservatives pregnant or trying to get pregnant breast-feeding How should I use this medication? This medicine is for injection or infusion  into a vein. It is given by a health care professional in a hospital or clinic setting. Talk to your pediatrician regarding the use of this medicine in children. Special care may be needed. Overdosage: If you think you have taken too much of this medicine contact a poison control center or emergency room at once. NOTE: This medicine is only for you. Do not share this medicine with others. What if I miss a dose? It is important not to miss your dose. Call your doctor or health care professional if you are unable to keep an appointment. What may interact with this medication? Interactions are not expected. This list may  not describe all possible interactions. Give your health care provider a list of all the medicines, herbs, non-prescription drugs, or dietary supplements you use. Also tell them if you smoke, drink alcohol, or use illegal drugs. Some items may interact with your medicine. What should I watch for while using this medication? Your condition will be monitored while you are receiving this medicine. You may need blood work done while you are taking this medicine. Do not become pregnant while taking this medicine or for 6 months after stopping it. Women should inform their health care provider if they wish to become pregnant or think they might be pregnant. Men should not father a child while taking this medicine and for 3 months after stopping it. There is a potential for serious side effects to an unborn child. Talk to your health care provider for more information. Do not breast-feed an infant while taking this medicine or for 2 weeks after stopping it. Check with your health care provider if you have severe diarrhea, nausea, and vomiting, or if you sweat a lot. The loss of too much body fluid may make it dangerous for you to take this medicine. You may get drowsy or dizzy. Do not drive, use machinery, or do anything that needs mental alertness until you know how this medicine affects you. Do not stand up or sit up quickly, especially if you are an older patient. This reduces the risk of dizzy or fainting spells. What side effects may I notice from receiving this medication? Side effects that you should report to your doctor or health care professional as soon as possible: allergic reactions like skin rash, itching or hives, swelling of the face, lips, or tongue confusion dizziness feeling faint or lightheaded fever or chills palpitations seizures signs and symptoms of bleeding such as bloody or black, tarry stools; red or dark-brown urine; spitting up blood or brown material that looks like coffee  grounds; red spots on the skin; unusual bruising or bleeding including from the eye, gums, or nose signs and symptoms of a blood clot such as breathing problems; changes in vision; chest pain; severe, sudden headache; pain, swelling, warmth in the leg; trouble speaking; sudden numbness or weakness of the face, arm or leg signs and symptoms of kidney injury like trouble passing urine or change in the amount of urine signs and symptoms of liver injury like dark yellow or brown urine; general ill feeling or flu-like symptoms; light-colored stools; loss of appetite; nausea; right upper belly pain; unusually weak or tired; yellowing of the eyes or skin Side effects that usually do not require medical attention (report to your doctor or health care professional if they continue or are bothersome): back pain cough diarrhea headache muscle cramps trouble sleeping vomiting This list may not describe all possible side effects. Call your doctor for medical advice  about side effects. You may report side effects to FDA at 1-800-FDA-1088. Where should I keep my medication? This drug is given in a hospital or clinic and will not be stored at home. NOTE: This sheet is a summary. It may not cover all possible information. If you have questions about this medicine, talk to your doctor, pharmacist, or health care provider.  2023 Elsevier/Gold Standard (2020-10-14 00:00:00)  

## 2022-02-28 ENCOUNTER — Ambulatory Visit: Payer: Medicare Other | Admitting: Internal Medicine

## 2022-03-01 ENCOUNTER — Other Ambulatory Visit (HOSPITAL_BASED_OUTPATIENT_CLINIC_OR_DEPARTMENT_OTHER): Payer: Self-pay

## 2022-03-09 ENCOUNTER — Encounter: Payer: Self-pay | Admitting: *Deleted

## 2022-03-09 NOTE — Progress Notes (Signed)
Records faxed to Dr Melba Coon on 6/16 and 6/23 for referral consult to [phone]

## 2022-03-12 ENCOUNTER — Other Ambulatory Visit: Payer: Self-pay | Admitting: Oncology

## 2022-03-13 ENCOUNTER — Inpatient Hospital Stay (HOSPITAL_BASED_OUTPATIENT_CLINIC_OR_DEPARTMENT_OTHER): Payer: Medicare Other | Admitting: Oncology

## 2022-03-13 ENCOUNTER — Encounter: Payer: Self-pay | Admitting: *Deleted

## 2022-03-13 ENCOUNTER — Inpatient Hospital Stay: Payer: Medicare Other

## 2022-03-13 VITALS — BP 139/60 | HR 72 | Temp 98.2°F | Resp 18 | Ht 69.0 in | Wt 183.2 lb

## 2022-03-13 DIAGNOSIS — C9000 Multiple myeloma not having achieved remission: Secondary | ICD-10-CM | POA: Diagnosis not present

## 2022-03-13 DIAGNOSIS — Z5112 Encounter for antineoplastic immunotherapy: Secondary | ICD-10-CM | POA: Diagnosis not present

## 2022-03-13 DIAGNOSIS — G620 Drug-induced polyneuropathy: Secondary | ICD-10-CM

## 2022-03-13 DIAGNOSIS — Z9481 Bone marrow transplant status: Secondary | ICD-10-CM

## 2022-03-13 LAB — CMP (CANCER CENTER ONLY)
ALT: 16 U/L (ref 0–44)
AST: 19 U/L (ref 15–41)
Albumin: 4.1 g/dL (ref 3.5–5.0)
Alkaline Phosphatase: 54 U/L (ref 38–126)
Anion gap: 10 (ref 5–15)
BUN: 28 mg/dL — ABNORMAL HIGH (ref 8–23)
CO2: 24 mmol/L (ref 22–32)
Calcium: 10.7 mg/dL — ABNORMAL HIGH (ref 8.9–10.3)
Chloride: 104 mmol/L (ref 98–111)
Creatinine: 1.09 mg/dL (ref 0.61–1.24)
GFR, Estimated: >60 mL/min (ref >60)
Glucose, Bld: 66 mg/dL — ABNORMAL LOW (ref 70–99)
Potassium: 4.3 mmol/L (ref 3.5–5.1)
Sodium: 138 mmol/L (ref 135–145)
Total Bilirubin: 0.5 mg/dL (ref 0.3–1.2)
Total Protein: 7.2 g/dL (ref 6.5–8.1)

## 2022-03-13 LAB — CBC WITH DIFFERENTIAL (CANCER CENTER ONLY)
Abs Immature Granulocytes: 0.01 10*3/uL (ref 0.00–0.07)
Basophils Absolute: 0 10*3/uL (ref 0.0–0.1)
Basophils Relative: 1 %
Eosinophils Absolute: 0.2 10*3/uL (ref 0.0–0.5)
Eosinophils Relative: 5 %
HCT: 28.7 % — ABNORMAL LOW (ref 39.0–52.0)
Hemoglobin: 9.7 g/dL — ABNORMAL LOW (ref 13.0–17.0)
Immature Granulocytes: 0 %
Lymphocytes Relative: 11 %
Lymphs Abs: 0.4 10*3/uL — ABNORMAL LOW (ref 0.7–4.0)
MCH: 35.8 pg — ABNORMAL HIGH (ref 26.0–34.0)
MCHC: 33.8 g/dL (ref 30.0–36.0)
MCV: 105.9 fL — ABNORMAL HIGH (ref 80.0–100.0)
Monocytes Absolute: 0.7 10*3/uL (ref 0.1–1.0)
Monocytes Relative: 21 %
Neutro Abs: 2.2 10*3/uL (ref 1.7–7.7)
Neutrophils Relative %: 62 %
Platelet Count: 101 10*3/uL — ABNORMAL LOW (ref 150–400)
RBC: 2.71 MIL/uL — ABNORMAL LOW (ref 4.22–5.81)
RDW: 17.4 % — ABNORMAL HIGH (ref 11.5–15.5)
WBC Count: 3.5 10*3/uL — ABNORMAL LOW (ref 4.0–10.5)
nRBC: 0 % (ref 0.0–0.2)

## 2022-03-13 MED ORDER — SODIUM CHLORIDE 0.9 % IV SOLN: Freq: Once | INTRAVENOUS | Status: AC

## 2022-03-13 MED ORDER — DEXTROSE 5 % IV SOLN
56.0000 mg/m2 | Freq: Once | INTRAVENOUS | Status: AC
  Administered 2022-03-13: 110 mg via INTRAVENOUS
  Filled 2022-03-13: qty 10

## 2022-03-13 MED ORDER — DEXAMETHASONE 4 MG PO TABS
10.0000 mg | ORAL_TABLET | Freq: Once | ORAL | Status: AC
  Administered 2022-03-13: 10 mg via ORAL
  Filled 2022-03-13: qty 3

## 2022-03-13 MED ORDER — SODIUM CHLORIDE 0.9 % IV SOLN
250.0000 mg/m2 | Freq: Once | INTRAVENOUS | Status: AC
  Administered 2022-03-13: 500 mg via INTRAVENOUS
  Filled 2022-03-13: qty 25

## 2022-03-14 LAB — KAPPA/LAMBDA LIGHT CHAINS
Kappa free light chain: 9.6 mg/L (ref 3.3–19.4)
Kappa, lambda light chain ratio: 0.07 — ABNORMAL LOW (ref 0.26–1.65)
Lambda free light chains: 129.4 mg/L — ABNORMAL HIGH (ref 5.7–26.3)

## 2022-03-14 LAB — IGG: IgG (Immunoglobin G), Serum: 1334 mg/dL (ref 603–1613)

## 2022-03-15 LAB — PROTEIN ELECTROPHORESIS, SERUM
A/G Ratio: 1.2 (ref 0.7–1.7)
Albumin ELP: 3.6 g/dL (ref 2.9–4.4)
Alpha-1-Globulin: 0.2 g/dL (ref 0.0–0.4)
Alpha-2-Globulin: 0.7 g/dL (ref 0.4–1.0)
Beta Globulin: 1 g/dL (ref 0.7–1.3)
Gamma Globulin: 1.2 g/dL (ref 0.4–1.8)
Globulin, Total: 3.1 g/dL (ref 2.2–3.9)
M-Spike, %: 1.1 g/dL — ABNORMAL HIGH
Total Protein ELP: 6.7 g/dL (ref 6.0–8.5)

## 2022-03-18 ENCOUNTER — Other Ambulatory Visit: Payer: Self-pay | Admitting: Oncology

## 2022-03-20 ENCOUNTER — Inpatient Hospital Stay: Payer: Medicare Other | Attending: Oncology

## 2022-03-20 ENCOUNTER — Inpatient Hospital Stay: Payer: Medicare Other

## 2022-03-20 DIAGNOSIS — C9000 Multiple myeloma not having achieved remission: Secondary | ICD-10-CM

## 2022-03-20 DIAGNOSIS — Z5111 Encounter for antineoplastic chemotherapy: Secondary | ICD-10-CM | POA: Diagnosis present

## 2022-03-20 DIAGNOSIS — G620 Drug-induced polyneuropathy: Secondary | ICD-10-CM

## 2022-03-20 DIAGNOSIS — Z9481 Bone marrow transplant status: Secondary | ICD-10-CM

## 2022-03-20 DIAGNOSIS — Z79899 Other long term (current) drug therapy: Secondary | ICD-10-CM | POA: Insufficient documentation

## 2022-03-20 DIAGNOSIS — Z5112 Encounter for antineoplastic immunotherapy: Secondary | ICD-10-CM | POA: Diagnosis not present

## 2022-03-20 LAB — CBC WITH DIFFERENTIAL (CANCER CENTER ONLY)
Abs Immature Granulocytes: 0.03 10*3/uL (ref 0.00–0.07)
Basophils Absolute: 0 10*3/uL (ref 0.0–0.1)
Basophils Relative: 0 %
Eosinophils Absolute: 0.1 10*3/uL (ref 0.0–0.5)
Eosinophils Relative: 3 %
HCT: 27.4 % — ABNORMAL LOW (ref 39.0–52.0)
Hemoglobin: 8.9 g/dL — ABNORMAL LOW (ref 13.0–17.0)
Immature Granulocytes: 1 %
Lymphocytes Relative: 11 %
Lymphs Abs: 0.5 10*3/uL — ABNORMAL LOW (ref 0.7–4.0)
MCH: 35.3 pg — ABNORMAL HIGH (ref 26.0–34.0)
MCHC: 32.5 g/dL (ref 30.0–36.0)
MCV: 108.7 fL — ABNORMAL HIGH (ref 80.0–100.0)
Monocytes Absolute: 0.7 10*3/uL (ref 0.1–1.0)
Monocytes Relative: 15 %
Neutro Abs: 3.2 10*3/uL (ref 1.7–7.7)
Neutrophils Relative %: 70 %
Platelet Count: 70 10*3/uL — ABNORMAL LOW (ref 150–400)
RBC: 2.52 MIL/uL — ABNORMAL LOW (ref 4.22–5.81)
RDW: 17.2 % — ABNORMAL HIGH (ref 11.5–15.5)
WBC Count: 4.6 10*3/uL (ref 4.0–10.5)
nRBC: 0 % (ref 0.0–0.2)

## 2022-03-20 MED ORDER — DEXTROSE 5 % IV SOLN
56.0000 mg/m2 | Freq: Once | INTRAVENOUS | Status: AC
  Administered 2022-03-20: 110 mg via INTRAVENOUS
  Filled 2022-03-20: qty 30

## 2022-03-20 MED ORDER — SODIUM CHLORIDE 0.9% FLUSH: 10.0000 mL | INTRAVENOUS | Status: DC | PRN

## 2022-03-20 MED ORDER — DEXAMETHASONE 4 MG PO TABS
10.0000 mg | ORAL_TABLET | Freq: Once | ORAL | Status: AC
  Administered 2022-03-20: 10 mg via ORAL
  Filled 2022-03-20: qty 3

## 2022-03-20 MED ORDER — SODIUM CHLORIDE 0.9 % IV SOLN: Freq: Once | INTRAVENOUS | Status: AC

## 2022-03-20 MED ORDER — HEPARIN SOD (PORK) LOCK FLUSH 100 UNIT/ML IV SOLN: 500.0000 [IU] | Freq: Once | INTRAVENOUS | Status: DC | PRN

## 2022-03-20 MED ORDER — SODIUM CHLORIDE 0.9 % IV SOLN
250.0000 mg/m2 | Freq: Once | INTRAVENOUS | Status: AC
  Administered 2022-03-20: 500 mg via INTRAVENOUS
  Filled 2022-03-20: qty 25

## 2022-03-20 NOTE — Progress Notes (Signed)
Ok to treat with platelets at 68 and CBC only today per MD Benay Spice.  Zometa to be done at next infusion appt on 03/27/22 per pharmacy.

## 2022-03-20 NOTE — Patient Instructions (Signed)
[facility]  Discharge Instructions: Thank you for choosing [facility] to provide your oncology and hematology care.   If you have a lab appointment with the [facility], please go directly to the [facility] and check in at the registration area.   Wear comfortable clothing and clothing appropriate for easy access to any Portacath or PICC line.   We strive to give you quality time with your provider. You may need to reschedule your appointment if you arrive late (15 or more minutes).  Arriving late affects you and other patients whose appointments are after yours.  Also, if you miss three or more appointments without notifying the office, you may be dismissed from the clinic at the provider's discretion.      For prescription refill requests, have your pharmacy contact our office and allow 72 hours for refills to be completed.    Today you received the following chemotherapy and/or immunotherapy agents Kyprolis and Cytoxan      To help prevent nausea and vomiting after your treatment, we encourage you to take your nausea medication as directed.  BELOW ARE SYMPTOMS THAT SHOULD BE REPORTED IMMEDIATELY: *FEVER GREATER THAN 100.4 F (38 C) OR HIGHER *CHILLS OR SWEATING *NAUSEA AND VOMITING THAT IS NOT CONTROLLED WITH YOUR NAUSEA MEDICATION *UNUSUAL SHORTNESS OF BREATH *UNUSUAL BRUISING OR BLEEDING *URINARY PROBLEMS (pain or burning when urinating, or frequent urination) *BOWEL PROBLEMS (unusual diarrhea, constipation, pain near the anus) TENDERNESS IN MOUTH AND THROAT WITH OR WITHOUT PRESENCE OF ULCERS (sore throat, sores in mouth, or a toothache) UNUSUAL RASH, SWELLING OR PAIN  UNUSUAL VAGINAL DISCHARGE OR ITCHING   Items with * indicate a potential emergency and should be followed up as soon as possible or go to the Emergency Department if any problems should occur.  Please show the CHEMOTHERAPY ALERT CARD or IMMUNOTHERAPY ALERT CARD at  check-in to the Emergency Department and triage nurse.  Should you have questions after your visit or need to cancel or reschedule your appointment, please contact [facility]  Dept: [phone]  and follow the prompts.  Office hours are 8:00 a.m. to 4:30 p.m. Monday - Friday. Please note that voicemails left after 4:00 p.m. may not be returned until the following business day.  We are closed weekends and major holidays. You have access to a nurse at all times for urgent questions. Please call the main number to the clinic Dept: [phone] and follow the prompts.   For any non-urgent questions, you may also contact your provider using MyChart. We now offer e-Visits for anyone 80 and older to request care online for non-urgent symptoms. For details visit [email or web address].   Also download the MyChart app! Go to the app store, search "MyChart", open the app, select , and log in with your MyChart username and password.  Masks are optional in the cancer centers. If you would like for your care team to wear a mask while they are taking care of you, please let them know. For doctor visits, patients may have with them one support person who is at least 73 years old. At this time, visitors are not allowed in the infusion area.

## 2022-03-25 ENCOUNTER — Other Ambulatory Visit: Payer: Self-pay | Admitting: Oncology

## 2022-03-27 ENCOUNTER — Inpatient Hospital Stay: Payer: Medicare Other

## 2022-03-27 VITALS — BP 134/62 | HR 68 | Temp 98.2°F | Resp 18 | Ht 69.0 in | Wt 181.2 lb

## 2022-03-27 DIAGNOSIS — Z9481 Bone marrow transplant status: Secondary | ICD-10-CM

## 2022-03-27 DIAGNOSIS — G620 Drug-induced polyneuropathy: Secondary | ICD-10-CM

## 2022-03-27 DIAGNOSIS — C9000 Multiple myeloma not having achieved remission: Secondary | ICD-10-CM

## 2022-03-27 DIAGNOSIS — Z5112 Encounter for antineoplastic immunotherapy: Secondary | ICD-10-CM | POA: Diagnosis not present

## 2022-03-27 LAB — CBC WITH DIFFERENTIAL (CANCER CENTER ONLY)
Abs Immature Granulocytes: 0.02 10*3/uL (ref 0.00–0.07)
Basophils Absolute: 0 10*3/uL (ref 0.0–0.1)
Basophils Relative: 1 %
Eosinophils Absolute: 0.1 10*3/uL (ref 0.0–0.5)
Eosinophils Relative: 3 %
HCT: 29.7 % — ABNORMAL LOW (ref 39.0–52.0)
Hemoglobin: 10 g/dL — ABNORMAL LOW (ref 13.0–17.0)
Immature Granulocytes: 1 %
Lymphocytes Relative: 9 %
Lymphs Abs: 0.4 10*3/uL — ABNORMAL LOW (ref 0.7–4.0)
MCH: 37.2 pg — ABNORMAL HIGH (ref 26.0–34.0)
MCHC: 33.7 g/dL (ref 30.0–36.0)
MCV: 110.4 fL — ABNORMAL HIGH (ref 80.0–100.0)
Monocytes Absolute: 0.8 10*3/uL (ref 0.1–1.0)
Monocytes Relative: 20 %
Neutro Abs: 2.9 10*3/uL (ref 1.7–7.7)
Neutrophils Relative %: 66 %
Platelet Count: 65 10*3/uL — ABNORMAL LOW (ref 150–400)
RBC: 2.69 MIL/uL — ABNORMAL LOW (ref 4.22–5.81)
RDW: 16.9 % — ABNORMAL HIGH (ref 11.5–15.5)
WBC Count: 4.3 10*3/uL (ref 4.0–10.5)
nRBC: 0 % (ref 0.0–0.2)

## 2022-03-27 LAB — BASIC METABOLIC PANEL - CANCER CENTER ONLY
Anion gap: 10 (ref 5–15)
BUN: 29 mg/dL — ABNORMAL HIGH (ref 8–23)
CO2: 24 mmol/L (ref 22–32)
Calcium: 10.3 mg/dL (ref 8.9–10.3)
Chloride: 103 mmol/L (ref 98–111)
Creatinine: 1.07 mg/dL (ref 0.61–1.24)
GFR, Estimated: >60 mL/min (ref >60)
Glucose, Bld: 67 mg/dL — ABNORMAL LOW (ref 70–99)
Potassium: 4.4 mmol/L (ref 3.5–5.1)
Sodium: 137 mmol/L (ref 135–145)

## 2022-03-27 MED ORDER — ZOLEDRONIC ACID 4 MG/100ML IV SOLN
4.0000 mg | Freq: Once | INTRAVENOUS | Status: AC
  Administered 2022-03-27: 4 mg via INTRAVENOUS
  Filled 2022-03-27: qty 100

## 2022-03-27 MED ORDER — SODIUM CHLORIDE 0.9 % IV SOLN
250.0000 mg/m2 | Freq: Once | INTRAVENOUS | Status: AC
  Administered 2022-03-27: 500 mg via INTRAVENOUS
  Filled 2022-03-27: qty 25

## 2022-03-27 MED ORDER — SODIUM CHLORIDE 0.9 % IV SOLN: Freq: Once | INTRAVENOUS | Status: AC

## 2022-03-27 MED ORDER — DEXAMETHASONE 4 MG PO TABS
10.0000 mg | ORAL_TABLET | Freq: Once | ORAL | Status: AC
  Administered 2022-03-27: 10 mg via ORAL
  Filled 2022-03-27: qty 3

## 2022-03-27 MED ORDER — DEXTROSE 5 % IV SOLN
56.0000 mg/m2 | Freq: Once | INTRAVENOUS | Status: AC
  Administered 2022-03-27: 110 mg via INTRAVENOUS
  Filled 2022-03-27: qty 10

## 2022-03-27 NOTE — Patient Instructions (Signed)
[facility]  Discharge Instructions: Thank you for choosing [facility] to provide your oncology and hematology care.   If you have a lab appointment with the [facility], please go directly to the [facility] and check in at the registration area.   Wear comfortable clothing and clothing appropriate for easy access to any Portacath or PICC line.   We strive to give you quality time with your provider. You may need to reschedule your appointment if you arrive late (15 or more minutes).  Arriving late affects you and other patients whose appointments are after yours.  Also, if you miss three or more appointments without notifying the office, you may be dismissed from the clinic at the provider's discretion.      For prescription refill requests, have your pharmacy contact our office and allow 72 hours for refills to be completed.    Today you received the following chemotherapy and/or immunotherapy agents Kyprolis and Cytoxan      To help prevent nausea and vomiting after your treatment, we encourage you to take your nausea medication as directed.  BELOW ARE SYMPTOMS THAT SHOULD BE REPORTED IMMEDIATELY: *FEVER GREATER THAN 100.4 F (38 C) OR HIGHER *CHILLS OR SWEATING *NAUSEA AND VOMITING THAT IS NOT CONTROLLED WITH YOUR NAUSEA MEDICATION *UNUSUAL SHORTNESS OF BREATH *UNUSUAL BRUISING OR BLEEDING *URINARY PROBLEMS (pain or burning when urinating, or frequent urination) *BOWEL PROBLEMS (unusual diarrhea, constipation, pain near the anus) TENDERNESS IN MOUTH AND THROAT WITH OR WITHOUT PRESENCE OF ULCERS (sore throat, sores in mouth, or a toothache) UNUSUAL RASH, SWELLING OR PAIN  UNUSUAL VAGINAL DISCHARGE OR ITCHING   Items with * indicate a potential emergency and should be followed up as soon as possible or go to the Emergency Department if any problems should occur.  Please show the CHEMOTHERAPY ALERT CARD or IMMUNOTHERAPY ALERT CARD at  check-in to the Emergency Department and triage nurse.  Should you have questions after your visit or need to cancel or reschedule your appointment, please contact [facility]  Dept: [phone]  and follow the prompts.  Office hours are 8:00 a.m. to 4:30 p.m. Monday - Friday. Please note that voicemails left after 4:00 p.m. may not be returned until the following business day.  We are closed weekends and major holidays. You have access to a nurse at all times for urgent questions. Please call the main number to the clinic Dept: [phone] and follow the prompts.   For any non-urgent questions, you may also contact your provider using MyChart. We now offer e-Visits for anyone 4 and older to request care online for non-urgent symptoms. For details visit [email or web address].   Also download the MyChart app! Go to the app store, search "MyChart", open the app, select [facility], and log in with your MyChart username and password.  Masks are optional in the cancer centers. If you would like for your care team to wear a mask while they are taking care of you, please let them know. For doctor visits, patients may have with them one support person who is at least 73 years old. At this time, visitors are not allowed in the infusion area.  Zoledronic Acid Injection (Cancer) What is this medication? ZOLEDRONIC ACID (ZOE le dron ik AS id) treats high calcium levels in the blood caused by cancer. It may also be used with chemotherapy to treat weakened bones caused by cancer. It works by slowing down the release of  calcium from bones. This lowers calcium levels in your blood. It also makes your bones stronger and less likely to break (fracture). It belongs to a group of medications called bisphosphonates. This medicine may be used for other purposes; ask your health care provider or pharmacist if you have questions. COMMON BRAND NAME(S): Zometa, Zometa Powder What should I tell  my care team before I take this medication? They need to know if you have any of these conditions: Dehydration Dental disease Kidney disease Liver disease Low levels of calcium in the blood Lung or breathing disease, such as asthma Receiving steroids, such as dexamethasone or prednisone An unusual or allergic reaction to zoledronic acid, other medications, foods, dyes, or preservatives Pregnant or trying to get pregnant Breast-feeding How should I use this medication? This medication is injected into a vein. It is given by your care team in a hospital or clinic setting. Talk to your care team about the use of this medication in children. Special care may be needed. Overdosage: If you think you have taken too much of this medicine contact a poison control center or emergency room at once. NOTE: This medicine is only for you. Do not share this medicine with others. What if I miss a dose? Keep appointments for follow-up doses. It is important not to miss your dose. Call your care team if you are unable to keep an appointment. What may interact with this medication? Certain antibiotics given by injection Diuretics, such as bumetanide, furosemide NSAIDs, medications for pain and inflammation, such as ibuprofen or naproxen Teriparatide Thalidomide This list may not describe all possible interactions. Give your health care provider a list of all the medicines, herbs, non-prescription drugs, or dietary supplements you use. Also tell them if you smoke, drink alcohol, or use illegal drugs. Some items may interact with your medicine. What should I watch for while using this medication? Visit your care team for regular checks on your progress. It may be some time before you see the benefit from this medication. Some people who take this medication have severe bone, joint, or muscle pain. This medication may also increase your risk for jaw problems or a broken thigh bone. Tell your care team right  away if you have severe pain in your jaw, bones, joints, or muscles. Tell you care team if you have any pain that does not go away or that gets worse. Tell your dentist and dental surgeon that you are taking this medication. You should not have major dental surgery while on this medication. See your dentist to have a dental exam and fix any dental problems before starting this medication. Take good care of your teeth while on this medication. Make sure you see your dentist for regular follow-up appointments. You should make sure you get enough calcium and vitamin D while you are taking this medication. Discuss the foods you eat and the vitamins you take with your care team. Check with your care team if you have severe diarrhea, nausea, and vomiting, or if you sweat a lot. The loss of too much body fluid may make it dangerous for you to take this medication. You may need bloodwork while taking this medication. Talk to your care team if you wish to become pregnant or think you might be pregnant. This medication can cause serious birth defects. What side effects may I notice from receiving this medication? Side effects that you should report to your care team as soon as possible: Allergic reactions--skin rash, itching, hives,  swelling of the face, lips, tongue, or throat Kidney injury--decrease in the amount of urine, swelling of the ankles, hands, or feet Low calcium level--muscle pain or cramps, confusion, tingling, or numbness in the hands or feet Osteonecrosis of the jaw--pain, swelling, or redness in the mouth, numbness of the jaw, poor healing after dental work, unusual discharge from the mouth, visible bones in the mouth Severe bone, joint, or muscle pain Side effects that usually do not require medical attention (report to your care team if they continue or are bothersome): Constipation Fatigue Fever Loss of appetite Nausea Stomach pain This list may not describe all possible side effects.  Call your doctor for medical advice about side effects. You may report side effects to FDA at 1-800-FDA-1088. Where should I keep my medication? This medication is given in a hospital or clinic. It will not be stored at home. NOTE: This sheet is a summary. It may not cover all possible information. If you have questions about this medicine, talk to your doctor, pharmacist, or health care provider.  2023 Elsevier/Gold Standard (2021-10-28 00:00:00)

## 2022-03-27 NOTE — Progress Notes (Signed)
Labs reviewed by Dr. Benay Spice and are not all within treatment parameters. OK to treat w/platelet count 65,000 and BMP only today.

## 2022-03-28 ENCOUNTER — Encounter: Payer: Self-pay | Admitting: *Deleted

## 2022-03-28 ENCOUNTER — Other Ambulatory Visit (HOSPITAL_BASED_OUTPATIENT_CLINIC_OR_DEPARTMENT_OTHER): Payer: Self-pay

## 2022-04-10 ENCOUNTER — Other Ambulatory Visit: Payer: Self-pay

## 2022-04-12 ENCOUNTER — Inpatient Hospital Stay: Payer: Medicare Other

## 2022-04-12 ENCOUNTER — Inpatient Hospital Stay: Payer: Medicare Other | Admitting: Oncology

## 2022-04-12 ENCOUNTER — Telehealth: Payer: Self-pay

## 2022-04-12 NOTE — Telephone Encounter (Signed)
LVM for pt to call and setup an AWV or to inform us if a nurse has came to his home on what date.

## 2022-04-16 ENCOUNTER — Other Ambulatory Visit: Payer: Self-pay | Admitting: Oncology

## 2022-04-18 ENCOUNTER — Inpatient Hospital Stay: Payer: Medicare Other

## 2022-04-18 ENCOUNTER — Other Ambulatory Visit: Payer: Self-pay

## 2022-04-18 ENCOUNTER — Inpatient Hospital Stay: Payer: Medicare Other | Attending: Oncology | Admitting: Oncology

## 2022-04-18 VITALS — BP 131/71 | HR 91 | Temp 98.2°F | Resp 20 | Ht 69.0 in | Wt 182.4 lb

## 2022-04-18 VITALS — BP 110/66 | HR 65 | Resp 18

## 2022-04-18 DIAGNOSIS — Z5112 Encounter for antineoplastic immunotherapy: Secondary | ICD-10-CM | POA: Insufficient documentation

## 2022-04-18 DIAGNOSIS — Z5111 Encounter for antineoplastic chemotherapy: Secondary | ICD-10-CM | POA: Insufficient documentation

## 2022-04-18 DIAGNOSIS — C9000 Multiple myeloma not having achieved remission: Secondary | ICD-10-CM | POA: Insufficient documentation

## 2022-04-18 DIAGNOSIS — Z79899 Other long term (current) drug therapy: Secondary | ICD-10-CM | POA: Diagnosis not present

## 2022-04-18 DIAGNOSIS — Z9481 Bone marrow transplant status: Secondary | ICD-10-CM

## 2022-04-18 DIAGNOSIS — T451X5A Adverse effect of antineoplastic and immunosuppressive drugs, initial encounter: Secondary | ICD-10-CM

## 2022-04-18 LAB — CMP (CANCER CENTER ONLY)
ALT: 13 U/L (ref 0–44)
AST: 17 U/L (ref 15–41)
Albumin: 4.4 g/dL (ref 3.5–5.0)
Alkaline Phosphatase: 52 U/L (ref 38–126)
Anion gap: 10 (ref 5–15)
BUN: 31 mg/dL — ABNORMAL HIGH (ref 8–23)
CO2: 24 mmol/L (ref 22–32)
Calcium: 10.8 mg/dL — ABNORMAL HIGH (ref 8.9–10.3)
Chloride: 102 mmol/L (ref 98–111)
Creatinine: 1.21 mg/dL (ref 0.61–1.24)
GFR, Estimated: >60 mL/min (ref >60)
Glucose, Bld: 77 mg/dL (ref 70–99)
Potassium: 4.7 mmol/L (ref 3.5–5.1)
Sodium: 136 mmol/L (ref 135–145)
Total Bilirubin: 0.5 mg/dL (ref 0.3–1.2)
Total Protein: 7.1 g/dL (ref 6.5–8.1)

## 2022-04-18 LAB — CBC WITH DIFFERENTIAL (CANCER CENTER ONLY)
Abs Immature Granulocytes: 0.01 10*3/uL (ref 0.00–0.07)
Basophils Absolute: 0 10*3/uL (ref 0.0–0.1)
Basophils Relative: 0 %
Eosinophils Absolute: 0.2 10*3/uL (ref 0.0–0.5)
Eosinophils Relative: 5 %
HCT: 30 % — ABNORMAL LOW (ref 39.0–52.0)
Hemoglobin: 10.1 g/dL — ABNORMAL LOW (ref 13.0–17.0)
Immature Granulocytes: 0 %
Lymphocytes Relative: 11 %
Lymphs Abs: 0.5 10*3/uL — ABNORMAL LOW (ref 0.7–4.0)
MCH: 37.1 pg — ABNORMAL HIGH (ref 26.0–34.0)
MCHC: 33.7 g/dL (ref 30.0–36.0)
MCV: 110.3 fL — ABNORMAL HIGH (ref 80.0–100.0)
Monocytes Absolute: 0.8 10*3/uL (ref 0.1–1.0)
Monocytes Relative: 19 %
Neutro Abs: 2.7 10*3/uL (ref 1.7–7.7)
Neutrophils Relative %: 65 %
Platelet Count: 100 10*3/uL — ABNORMAL LOW (ref 150–400)
RBC: 2.72 MIL/uL — ABNORMAL LOW (ref 4.22–5.81)
RDW: 15 % (ref 11.5–15.5)
WBC Count: 4.2 10*3/uL (ref 4.0–10.5)
nRBC: 0 % (ref 0.0–0.2)

## 2022-04-18 MED ORDER — SODIUM CHLORIDE 0.9 % IV SOLN: Freq: Once | INTRAVENOUS | Status: AC

## 2022-04-18 MED ORDER — DEXTROSE 5 % IV SOLN
56.0000 mg/m2 | Freq: Once | INTRAVENOUS | Status: AC
  Administered 2022-04-18: 110 mg via INTRAVENOUS
  Filled 2022-04-18: qty 55

## 2022-04-18 MED ORDER — SODIUM CHLORIDE 0.9 % IV SOLN
250.0000 mg/m2 | Freq: Once | INTRAVENOUS | Status: AC
  Administered 2022-04-18: 500 mg via INTRAVENOUS
  Filled 2022-04-18: qty 25

## 2022-04-18 MED ORDER — DEXAMETHASONE 4 MG PO TABS
10.0000 mg | ORAL_TABLET | Freq: Once | ORAL | Status: AC
  Administered 2022-04-18: 10 mg via ORAL
  Filled 2022-04-18: qty 3

## 2022-04-18 NOTE — Progress Notes (Signed)
Patient seen by Dr. Sherrill today ? ?Vitals are within treatment parameters. ? ?Labs reviewed by Dr. Sherrill and are within treatment parameters. ? ?Per physician team, patient is ready for treatment and there are NO modifications to the treatment plan.  ?

## 2022-04-18 NOTE — Patient Instructions (Addendum)
[facility]   Discharge Instructions: Thank you for choosing [facility] to provide your oncology and hematology care.   If you have a lab appointment with the [facility], please go directly to the [facility] and check in at the registration area.   Wear comfortable clothing and clothing appropriate for easy access to any Portacath or PICC line.   We strive to give you quality time with your provider. You may need to reschedule your appointment if you arrive late (15 or more minutes).  Arriving late affects you and other patients whose appointments are after yours.  Also, if you miss three or more appointments without notifying the office, you may be dismissed from the clinic at the provider's discretion.      For prescription refill requests, have your pharmacy contact our office and allow 72 hours for refills to be completed.    Today you received the following chemotherapy and/or immunotherapy agents Carfilzomib (KYPROLIS) & Cyclophosphamide (CYTOXAN).      To help prevent nausea and vomiting after your treatment, we encourage you to take your nausea medication as directed.  BELOW ARE SYMPTOMS THAT SHOULD BE REPORTED IMMEDIATELY: *FEVER GREATER THAN 100.4 F (38 C) OR HIGHER *CHILLS OR SWEATING *NAUSEA AND VOMITING THAT IS NOT CONTROLLED WITH YOUR NAUSEA MEDICATION *UNUSUAL SHORTNESS OF BREATH *UNUSUAL BRUISING OR BLEEDING *URINARY PROBLEMS (pain or burning when urinating, or frequent urination) *BOWEL PROBLEMS (unusual diarrhea, constipation, pain near the anus) TENDERNESS IN MOUTH AND THROAT WITH OR WITHOUT PRESENCE OF ULCERS (sore throat, sores in mouth, or a toothache) UNUSUAL RASH, SWELLING OR PAIN  UNUSUAL VAGINAL DISCHARGE OR ITCHING   Items with * indicate a potential emergency and should be followed up as soon as possible or go to the Emergency Department if any problems should occur.  Please show the CHEMOTHERAPY ALERT CARD or  IMMUNOTHERAPY ALERT CARD at check-in to the Emergency Department and triage nurse.  Should you have questions after your visit or need to cancel or reschedule your appointment, please contact [facility]  Dept: [phone]  and follow the prompts.  Office hours are 8:00 a.m. to 4:30 p.m. Monday - Friday. Please note that voicemails left after 4:00 p.m. may not be returned until the following business day.  We are closed weekends and major holidays. You have access to a nurse at all times for urgent questions. Please call the main number to the clinic Dept: [phone] and follow the prompts.   For any non-urgent questions, you may also contact your provider using MyChart. We now offer e-Visits for anyone 52 and older to request care online for non-urgent symptoms. For details visit [email or web address].   Also download the MyChart app! Go to the app store, search "MyChart", open the app, select [facility], and log in with your MyChart username and password.  Masks are optional in the cancer centers. If you would like for your care team to wear a mask while they are taking care of you, please let them know. You may have one support person who is at least 73 years old accompany you for your appointments.  Carfilzomib injection What is this medication? CARFILZOMIB (kar FILZ oh mib) targets a specific protein within cancer cells and stops the cancer cells from growing. It is used to treat multiple myeloma. This medicine may be used for other purposes; ask your health care provider or pharmacist if you have questions. COMMON BRAND NAME(S): KYPROLIS What  should I tell my care team before I take this medication? They need to know if you have any of these conditions: heart disease history of blood clots irregular heartbeat kidney disease liver disease lung or breathing disease an unusual or allergic reaction to carfilzomib, or other medicines, foods, dyes, or  preservatives pregnant or trying to get pregnant breast-feeding How should I use this medication? This medicine is for injection or infusion into a vein. It is given by a health care professional in a hospital or clinic setting. Talk to your pediatrician regarding the use of this medicine in children. Special care may be needed. Overdosage: If you think you have taken too much of this medicine contact a poison control center or emergency room at once. NOTE: This medicine is only for you. Do not share this medicine with others. What if I miss a dose? It is important not to miss your dose. Call your doctor or health care professional if you are unable to keep an appointment. What may interact with this medication? Interactions are not expected. This list may not describe all possible interactions. Give your health care provider a list of all the medicines, herbs, non-prescription drugs, or dietary supplements you use. Also tell them if you smoke, drink alcohol, or use illegal drugs. Some items may interact with your medicine. What should I watch for while using this medication? Your condition will be monitored while you are receiving this medicine. You may need blood work done while you are taking this medicine. Do not become pregnant while taking this medicine or for 6 months after stopping it. Women should inform their health care provider if they wish to become pregnant or think they might be pregnant. Men should not father a child while taking this medicine and for 3 months after stopping it. There is a potential for serious side effects to an unborn child. Talk to your health care provider for more information. Do not breast-feed an infant while taking this medicine or for 2 weeks after stopping it. Check with your health care provider if you have severe diarrhea, nausea, and vomiting, or if you sweat a lot. The loss of too much body fluid may make it dangerous for you to take this medicine. You  may get drowsy or dizzy. Do not drive, use machinery, or do anything that needs mental alertness until you know how this medicine affects you. Do not stand up or sit up quickly, especially if you are an older patient. This reduces the risk of dizzy or fainting spells. What side effects may I notice from receiving this medication? Side effects that you should report to your doctor or health care professional as soon as possible: allergic reactions like skin rash, itching or hives, swelling of the face, lips, or tongue confusion dizziness feeling faint or lightheaded fever or chills palpitations seizures signs and symptoms of bleeding such as bloody or black, tarry stools; red or dark-brown urine; spitting up blood or brown material that looks like coffee grounds; red spots on the skin; unusual bruising or bleeding including from the eye, gums, or nose signs and symptoms of a blood clot such as breathing problems; changes in vision; chest pain; severe, sudden headache; pain, swelling, warmth in the leg; trouble speaking; sudden numbness or weakness of the face, arm or leg signs and symptoms of kidney injury like trouble passing urine or change in the amount of urine signs and symptoms of liver injury like dark yellow or brown urine; general ill  feeling or flu-like symptoms; light-colored stools; loss of appetite; nausea; right upper belly pain; unusually weak or tired; yellowing of the eyes or skin Side effects that usually do not require medical attention (report to your doctor or health care professional if they continue or are bothersome): back pain cough diarrhea headache muscle cramps trouble sleeping vomiting This list may not describe all possible side effects. Call your doctor for medical advice about side effects. You may report side effects to FDA at 1-800-FDA-1088. Where should I keep my medication? This drug is given in a hospital or clinic and will not be stored at home. NOTE:  This sheet is a summary. It may not cover all possible information. If you have questions about this medicine, talk to your doctor, pharmacist, or health care provider.  2023 Elsevier/Gold Standard (2020-10-14 00:00:00)  Cyclophosphamide Injection What is this medication? CYCLOPHOSPHAMIDE (sye kloe FOSS fa mide) is a chemotherapy drug. It slows the growth of cancer cells. This medicine is used to treat many types of cancer like lymphoma, myeloma, leukemia, breast cancer, and ovarian cancer, to name a few. This medicine may be used for other purposes; ask your health care provider or pharmacist if you have questions. COMMON BRAND NAME(S): Cyclophosphamide, Cytoxan, Neosar What should I tell my care team before I take this medication? They need to know if you have any of these conditions: heart disease history of irregular heartbeat infection kidney disease liver disease low blood counts, like white cells, platelets, or red blood cells on hemodialysis recent or ongoing radiation therapy scarring or thickening of the lungs trouble passing urine an unusual or allergic reaction to cyclophosphamide, other medicines, foods, dyes, or preservatives pregnant or trying to get pregnant breast-feeding How should I use this medication? This drug is usually given as an injection into a vein or muscle or by infusion into a vein. It is administered in a hospital or clinic by a specially trained health care professional. Talk to your pediatrician regarding the use of this medicine in children. Special care may be needed. Overdosage: If you think you have taken too much of this medicine contact a poison control center or emergency room at once. NOTE: This medicine is only for you. Do not share this medicine with others. What if I miss a dose? It is important not to miss your dose. Call your doctor or health care professional if you are unable to keep an appointment. What may interact with this  medication? amphotericin Daniell azathioprine certain antivirals for HIV or hepatitis certain medicines for blood pressure, heart disease, irregular heart beat certain medicines that treat or prevent blood clots like warfarin certain other medicines for cancer cyclosporine etanercept indomethacin medicines that relax muscles for surgery medicines to increase blood counts metronidazole This list may not describe all possible interactions. Give your health care provider a list of all the medicines, herbs, non-prescription drugs, or dietary supplements you use. Also tell them if you smoke, drink alcohol, or use illegal drugs. Some items may interact with your medicine. What should I watch for while using this medication? Your condition will be monitored carefully while you are receiving this medicine. You may need blood work done while you are taking this medicine. Drink water or other fluids as directed. Urinate often, even at night. Some products may contain alcohol. Ask your health care professional if this medicine contains alcohol. Be sure to tell all health care professionals you are taking this medicine. Certain medicines, like metronidazole and disulfiram, can cause  an unpleasant reaction when taken with alcohol. The reaction includes flushing, headache, nausea, vomiting, sweating, and increased thirst. The reaction can last from 30 minutes to several hours. Do not become pregnant while taking this medicine or for 1 year after stopping it. Women should inform their health care professional if they wish to become pregnant or think they might be pregnant. Men should not father a child while taking this medicine and for 4 months after stopping it. There is potential for serious side effects to an unborn child. Talk to your health care professional for more information. Do not breast-feed an infant while taking this medicine or for 1 week after stopping it. This medicine has caused ovarian failure in  some women. This medicine may make it more difficult to get pregnant. Talk to your health care professional if you are concerned about your fertility. This medicine has caused decreased sperm counts in some men. This may make it more difficult to father a child. Talk to your health care professional if you are concerned about your fertility. Call your health care professional for advice if you get a fever, chills, or sore throat, or other symptoms of a cold or flu. Do not treat yourself. This medicine decreases your body's ability to fight infections. Try to avoid being around people who are sick. Avoid taking medicines that contain aspirin, acetaminophen, ibuprofen, naproxen, or ketoprofen unless instructed by your health care professional. These medicines may hide a fever. Talk to your health care professional about your risk of cancer. You may be more at risk for certain types of cancer if you take this medicine. If you are going to need surgery or other procedure, tell your health care professional that you are using this medicine. Be careful brushing or flossing your teeth or using a toothpick because you may get an infection or bleed more easily. If you have any dental work done, tell your dentist you are receiving this medicine. What side effects may I notice from receiving this medication? Side effects that you should report to your doctor or health care professional as soon as possible: allergic reactions like skin rash, itching or hives, swelling of the face, lips, or tongue breathing problems nausea, vomiting signs and symptoms of bleeding such as bloody or black, tarry stools; red or dark brown urine; spitting up blood or brown material that looks like coffee grounds; red spots on the skin; unusual bruising or bleeding from the eyes, gums, or nose signs and symptoms of heart failure like fast, irregular heartbeat, sudden weight gain; swelling of the ankles, feet, hands signs and symptoms of  infection like fever; chills; cough; sore throat; pain or trouble passing urine signs and symptoms of kidney injury like trouble passing urine or change in the amount of urine signs and symptoms of liver injury like dark yellow or brown urine; general ill feeling or flu-like symptoms; light-colored stools; loss of appetite; nausea; right upper belly pain; unusually weak or tired; yellowing of the eyes or skin Side effects that usually do not require medical attention (report to your doctor or health care professional if they continue or are bothersome): confusion decreased hearing diarrhea facial flushing hair loss headache loss of appetite missed menstrual periods signs and symptoms of low red blood cells or anemia such as unusually weak or tired; feeling faint or lightheaded; falls skin discoloration This list may not describe all possible side effects. Call your doctor for medical advice about side effects. You may report side effects to  FDA at 1-800-FDA-1088. Where should I keep my medication? This drug is given in a hospital or clinic and will not be stored at home. NOTE: This sheet is a summary. It may not cover all possible information. If you have questions about this medicine, talk to your doctor, pharmacist, or health care provider.  2023 Elsevier/Gold Standard (2021-08-05 00:00:00)

## 2022-04-18 NOTE — Progress Notes (Signed)
[facility] OFFICE PROGRESS NOTE   Diagnosis: Multiple myeloma  INTERVAL HISTORY:   Andre [name] was last treated with carfilzomib/Cytoxan/Decadron on 03/27/2022.  He has increased diffuse pruritus over the past few weeks.  No rash.  He continues exercise.  He saw Dr. Philipp Ovens 04/14/2022.  Dr. Philipp Ovens note is not yet available.  Andre [name] and his wife report Dr. Melba Coon recommends proceeding with CAR-T therapy.  He plans to begin the a pheresis process in September.  They report Dr. Philipp Ovens recommends continuing carfilzomib/Cytoxan/Decadron for now.  Objective:  Vital signs in last 24 hours:  Blood pressure 131/71, pulse 91, temperature 98.2 F (36.8 C), temperature source Oral, resp. rate 20, height 5' 9"  (1.753 m), weight 182 lb 6.4 oz (82.7 kg), SpO2 98 %.    HEENT: No thrush or ulcers Resp: Lungs clear bilaterally Cardio: Regular rate and rhythm GI: No hepatosplenomegaly Vascular: No leg edema  Skin: No rash  Portacath/PICC-without erythema  Lab Results:  Lab Results  Component Value Date   WBC 4.2 04/18/2022   HGB 10.1 (L) 04/18/2022   HCT 30.0 (L) 04/18/2022   MCV 110.3 (H) 04/18/2022   PLT 100 (L) 04/18/2022   NEUTROABS 2.7 04/18/2022    CMP  Lab Results  Component Value Date   NA 136 04/18/2022   K 4.7 04/18/2022   CL 102 04/18/2022   CO2 24 04/18/2022   GLUCOSE 77 04/18/2022   BUN 31 (H) 04/18/2022   CREATININE 1.21 04/18/2022   CALCIUM 10.8 (H) 04/18/2022   PROT 7.1 04/18/2022   ALBUMIN 4.4 04/18/2022   AST 17 04/18/2022   ALT 13 04/18/2022   ALKPHOS 52 04/18/2022   BILITOT 0.5 04/18/2022   GFRNONAA >60 04/18/2022   GFRAA >60 06/15/2020     Medications: I have reviewed the patient's current medications.   Assessment/Plan: Multiple myeloma- IgG lambda  bortezomib (subcutaneously), lenalidomide, and dexamethasone, with repeat bone marrow biopsy May of 2012 showing 10% plasmacytosis   (2) high-dose chemotherapy with BCNU and  melphalan at [facility], followed by stem cell rescue July of 2012   (3) on zoledronic acid started December of 2012, initially monthly, currently given every 3 months, most recent dose  12/07/2015   (4) low-dose lenalidomide resumed April 2013, interrupted several times.  Resumed again on 02/19/2013, ata dose of 5 mg daily, 21 days on and 7 days off, later further reduced to 7 days on, 7 days off   (5) CNS symptoms and abnormal brain MRI extensively evaluated by neurology with no definitive diagnosis established   (6) rising M spike noted June 2014 but did not meet criteria for carfilzomib study   (7) on lenalidomide 25 mg daily, 14 days on, 7 days off, starting 04/18/2013, interrupted December 2014 because of rash;             (a) resumed January 2015 at 10 mg/ day at 21 days on/ 7 days off             (Geronimo) starting 08/18/2014 decreased to 10 mg/ day 14 days on, 7 days off because of cytopenias             (c) as of February 2016 was on 5 mg daily 7 days on 7 days off             (d) lenalidomide discontinued December 2016 with evidence of disease progression   (8) transient global amnesia 05/29/2015, resolved without intervention   (9) starting PVD 10/25/2015 w ASA  325 thromboprophylaxis, valacyclovir prophylaxis, last dose 12/17/2015             (a) pomalidomide 4 mg/d days 1-14             (Erman) bortezomib sQ days 2,5,9,12 of each 21 day cycle             (c) dexamethasome 20 mg two days a week             (d) dexamethasone bortezomib and pomalidomide discontinued late December 2018 with poor tolerance   (10) metapneumovirus pneumonia April 2017             (a) completing course of steroids and week of bactrim mid April 2017   (11) status post second autologous transplant at [facility] 02/04/2016(preparatory regimen melphalan 200 mg/m)             (a) received twelve-month vaccinations 03/14/2017 (DPT, Haemophilus, Pneumovax 13, polio)             (Calel) 14 months injections 05/04/2016 include DTaP,  Hib conjugate, HepB energex Jedrek 20 mcg/ml, Prevnar 13             (c) 24 month vaccines due at [facility] June 2019   (12) maintenance therapy started November 2017, consisting of             (a) bortezomib 1.3 mg/M2 every 14 days, first dose 07/27/2016             (Jayvon) pomalidomide 1 mg days 1-21 Q28 days, started 07/19/2016             (c) zolendronate monthly started 07/27/2016 (previously Q 3 months) however patient unable to tolerate, and changed back to q3 months in April, 2018             (d) Bortezomib changed to monthly as of June 2018 because of tolerance issues, however discontinued after 09/14/2017 dose because of a rise in his M spike             (e) pomalidomide held after 10/18/2017 in preparation for possible study at [facility] (venetoclax)--never resumed             (f) with numbers actually improved off treatment, resumed every 2-week bortezomib 12/25/2017             (g) changed to every 3-week bortezomib as of 03/17/2019             (h) briefly on weekly treatments times 22 October 2020 due to increase in M spike             (I) maintenance therapy discontinued with evidence of progression   (13) bortezomib/daratumumab/dexamethasone started 12/20/2020.             (a) Decadron dose dropped to 10 mg day of and day following treatment starting May 2022             (Coren) day 8 bortezomib omitted beginning with the June cycle per patient preference             (C) treatment changed to monthly daratumumab/Velcade/Decadron beginning 06/27/2021-patient decision (14) weekly Velcade/Cytoxan beginning 11/14/2021 (15) changed to Cytoxan/carfilzomib/Decadron 12/19/2021      Disposition: Andre [name] appears unchanged.  The myeloma markers have been slowly increasing.  He saw Dr. Melba Coon and plans to proceed with CAR-T therapy over the next few months.  We will continue carfilzomib/Cytoxan/Decadron for now.  I doubt the pruritus is related to the current treatment.  He will  call for increased pruritus  or a rash.  The calcium level is mildly elevated today.  He took a calcium supplement this morning.  We will check the calcium when he returns next week.  He continues every 23-monthZometa.  Andre. KLedermanwill return for an office visit in 4 weeks.  GBetsy Coder MD  04/18/2022  10:50 AM

## 2022-04-19 ENCOUNTER — Other Ambulatory Visit: Payer: Self-pay

## 2022-04-22 ENCOUNTER — Other Ambulatory Visit: Payer: Self-pay

## 2022-04-23 ENCOUNTER — Other Ambulatory Visit: Payer: Self-pay | Admitting: Oncology

## 2022-04-25 ENCOUNTER — Inpatient Hospital Stay: Payer: Medicare Other

## 2022-04-25 VITALS — BP 136/65 | HR 63 | Temp 98.0°F | Resp 20 | Ht 69.0 in | Wt 184.0 lb

## 2022-04-25 DIAGNOSIS — C9000 Multiple myeloma not having achieved remission: Secondary | ICD-10-CM

## 2022-04-25 DIAGNOSIS — Z9481 Bone marrow transplant status: Secondary | ICD-10-CM

## 2022-04-25 DIAGNOSIS — T451X5A Adverse effect of antineoplastic and immunosuppressive drugs, initial encounter: Secondary | ICD-10-CM

## 2022-04-25 DIAGNOSIS — Z5112 Encounter for antineoplastic immunotherapy: Secondary | ICD-10-CM | POA: Diagnosis not present

## 2022-04-25 LAB — CMP (CANCER CENTER ONLY)
ALT: 13 U/L (ref 0–44)
AST: 16 U/L (ref 15–41)
Albumin: 4.2 g/dL (ref 3.5–5.0)
Alkaline Phosphatase: 46 U/L (ref 38–126)
Anion gap: 12 (ref 5–15)
BUN: 31 mg/dL — ABNORMAL HIGH (ref 8–23)
CO2: 21 mmol/L — ABNORMAL LOW (ref 22–32)
Calcium: 10.1 mg/dL (ref 8.9–10.3)
Chloride: 102 mmol/L (ref 98–111)
Creatinine: 1.26 mg/dL — ABNORMAL HIGH (ref 0.61–1.24)
GFR, Estimated: >60 mL/min (ref >60)
Glucose, Bld: 72 mg/dL (ref 70–99)
Potassium: 4.4 mmol/L (ref 3.5–5.1)
Sodium: 135 mmol/L (ref 135–145)
Total Bilirubin: 0.5 mg/dL (ref 0.3–1.2)
Total Protein: 7.4 g/dL (ref 6.5–8.1)

## 2022-04-25 LAB — CBC WITH DIFFERENTIAL (CANCER CENTER ONLY)
Abs Immature Granulocytes: 0.02 10*3/uL (ref 0.00–0.07)
Basophils Absolute: 0 10*3/uL (ref 0.0–0.1)
Basophils Relative: 0 %
Eosinophils Absolute: 0.2 10*3/uL (ref 0.0–0.5)
Eosinophils Relative: 4 %
HCT: 28.7 % — ABNORMAL LOW (ref 39.0–52.0)
Hemoglobin: 9.6 g/dL — ABNORMAL LOW (ref 13.0–17.0)
Immature Granulocytes: 1 %
Lymphocytes Relative: 9 %
Lymphs Abs: 0.4 10*3/uL — ABNORMAL LOW (ref 0.7–4.0)
MCH: 37.8 pg — ABNORMAL HIGH (ref 26.0–34.0)
MCHC: 33.4 g/dL (ref 30.0–36.0)
MCV: 113 fL — ABNORMAL HIGH (ref 80.0–100.0)
Monocytes Absolute: 0.8 10*3/uL (ref 0.1–1.0)
Monocytes Relative: 19 %
Neutro Abs: 3 10*3/uL (ref 1.7–7.7)
Neutrophils Relative %: 67 %
Platelet Count: 74 10*3/uL — ABNORMAL LOW (ref 150–400)
RBC: 2.54 MIL/uL — ABNORMAL LOW (ref 4.22–5.81)
RDW: 15 % (ref 11.5–15.5)
WBC Count: 4.4 10*3/uL (ref 4.0–10.5)
nRBC: 0.5 % — ABNORMAL HIGH (ref 0.0–0.2)

## 2022-04-25 MED ORDER — DEXAMETHASONE 4 MG PO TABS
10.0000 mg | ORAL_TABLET | Freq: Once | ORAL | Status: AC
  Administered 2022-04-25: 10 mg via ORAL
  Filled 2022-04-25: qty 3

## 2022-04-25 MED ORDER — SODIUM CHLORIDE 0.9 % IV SOLN: Freq: Once | INTRAVENOUS | Status: AC

## 2022-04-25 MED ORDER — DEXTROSE 5 % IV SOLN
56.0000 mg/m2 | Freq: Once | INTRAVENOUS | Status: AC
  Administered 2022-04-25: 110 mg via INTRAVENOUS
  Filled 2022-04-25: qty 10

## 2022-04-25 MED ORDER — SODIUM CHLORIDE 0.9 % IV SOLN
250.0000 mg/m2 | Freq: Once | INTRAVENOUS | Status: AC
  Administered 2022-04-25: 500 mg via INTRAVENOUS
  Filled 2022-04-25: qty 25

## 2022-04-25 NOTE — Patient Instructions (Signed)
[facility]   Discharge Instructions: Thank you for choosing [facility] to provide your oncology and hematology care.   If you have a lab appointment with the [facility], please go directly to the [facility] and check in at the registration area.   Wear comfortable clothing and clothing appropriate for easy access to any Portacath or PICC line.   We strive to give you quality time with your provider. You may need to reschedule your appointment if you arrive late (15 or more minutes).  Arriving late affects you and other patients whose appointments are after yours.  Also, if you miss three or more appointments without notifying the office, you may be dismissed from the clinic at the provider's discretion.      For prescription refill requests, have your pharmacy contact our office and allow 72 hours for refills to be completed.    Today you received the following chemotherapy and/or immunotherapy agents Carfilzomib (KYPROLIS) & Cyclophosphamide (CYTOXAN).      To help prevent nausea and vomiting after your treatment, we encourage you to take your nausea medication as directed.  BELOW ARE SYMPTOMS THAT SHOULD BE REPORTED IMMEDIATELY: *FEVER GREATER THAN 100.4 F (38 C) OR HIGHER *CHILLS OR SWEATING *NAUSEA AND VOMITING THAT IS NOT CONTROLLED WITH YOUR NAUSEA MEDICATION *UNUSUAL SHORTNESS OF BREATH *UNUSUAL BRUISING OR BLEEDING *URINARY PROBLEMS (pain or burning when urinating, or frequent urination) *BOWEL PROBLEMS (unusual diarrhea, constipation, pain near the anus) TENDERNESS IN MOUTH AND THROAT WITH OR WITHOUT PRESENCE OF ULCERS (sore throat, sores in mouth, or a toothache) UNUSUAL RASH, SWELLING OR PAIN  UNUSUAL VAGINAL DISCHARGE OR ITCHING   Items with * indicate a potential emergency and should be followed up as soon as possible or go to the Emergency Department if any problems should occur.  Please show the CHEMOTHERAPY ALERT CARD or  IMMUNOTHERAPY ALERT CARD at check-in to the Emergency Department and triage nurse.  Should you have questions after your visit or need to cancel or reschedule your appointment, please contact [facility]  Dept: [phone]  and follow the prompts.  Office hours are 8:00 a.m. to 4:30 p.m. Monday - Friday. Please note that voicemails left after 4:00 p.m. may not be returned until the following business day.  We are closed weekends and major holidays. You have access to a nurse at all times for urgent questions. Please call the main number to the clinic Dept: [phone] and follow the prompts.   For any non-urgent questions, you may also contact your provider using MyChart. We now offer e-Visits for anyone 59 and older to request care online for non-urgent symptoms. For details visit [email or web address].   Also download the MyChart app! Go to the app store, search "MyChart", open the app, select [facility], and log in with your MyChart username and password.  Masks are optional in the cancer centers. If you would like for your care team to wear a mask while they are taking care of you, please let them know. You may have one support person who is at least 73 years old accompany you for your appointments.  Carfilzomib Injection What is this medication? CARFILZOMIB (kar FILZ oh mib) treats multiple myeloma, a type of bone marrow cancer. It works by blocking a protein that causes cancer cells to grow and multiply. This helps to slow or stop the spread of cancer cells. This medicine may be used for other purposes; ask your health  care provider or pharmacist if you have questions. COMMON BRAND NAME(S): KYPROLIS What should I tell my care team before I take this medication? They need to know if you have any of these conditions: Heart disease History of blood clots Irregular heartbeat Kidney disease Liver disease Lung or breathing disease An unusual or allergic  reaction to carfilzomib, or other medications, foods, dyes, or preservatives If you or your partner are pregnant or trying to get pregnant Breastfeeding How should I use this medication? This medication is injected into a vein. It is given by your care team in a hospital or clinic setting. Talk to your care team about the use of this medication in children. Special care may be needed. Overdosage: If you think you have taken too much of this medicine contact a poison control center or emergency room at once. NOTE: This medicine is only for you. Do not share this medicine with others. What if I miss a dose? Keep appointments for follow-up doses. It is important not to miss your dose. Call your care team if you are unable to keep an appointment. What may interact with this medication? Interactions are not expected. This list may not describe all possible interactions. Give your health care provider a list of all the medicines, herbs, non-prescription drugs, or dietary supplements you use. Also tell them if you smoke, drink alcohol, or use illegal drugs. Some items may interact with your medicine. What should I watch for while using this medication? Your condition will be monitored carefully while you are receiving this medication. You may need blood work while taking this medication. Check with your care team if you have severe diarrhea, nausea, and vomiting, or if you sweat a lot. The loss of too much body fluid may make it dangerous for you to take this medication. This medication may affect your coordination, reaction time, or judgment. Do not drive or operate machinery until you know how this medication affects you. Sit up or stand slowly to reduce the risk of dizzy or fainting spells. Drinking alcohol with this medication can increase the risk of these side effects. Talk to your care team if you may be pregnant. Serious birth defects can occur if you take this medication during pregnancy and for 6  months after the last dose. You will need a negative pregnancy test before starting this medication. Contraception is recommended while taking this medication and for 6 months after the last dose. Your care team can help you find an option that works for you. If your partner can get pregnant, use a condom during sex while taking this medication and for 3 months after the last dose. Do not breastfeed while taking this medication and for 2 weeks after the last dose. This medication may cause infertility. Talk to your care team if you are concerned about your fertility. What side effects may I notice from receiving this medication? Side effects that you should report to your care team as soon as possible: Allergic reactions--skin rash, itching, hives, swelling of the face, lips, tongue, or throat Bleeding--bloody or black, tar-like stools, vomiting blood or brown material that looks like coffee grounds, red or dark brown urine, small red or purple spots on skin, unusual bruising or bleeding Blood clot--pain, swelling, or warmth in the leg, shortness of breath, chest pain Dizziness, loss of balance or coordination, confusion or trouble speaking Heart attack--pain or tightness in the chest, shoulders, arms, or jaw, nausea, shortness of breath, cold or clammy skin, feeling faint  or lightheaded Heart failure--shortness of breath, swelling of the ankles, feet, or hands, sudden weight gain, unusual weakness or fatigue Heart rhythm changes--fast or irregular heartbeat, dizziness, feeling faint or lightheaded, chest pain, trouble breathing Increase in blood pressure Infection--fever, chills, cough, sore throat, wounds that don't heal, pain or trouble when passing urine, general feeling of discomfort or being unwell Infusion reactions--chest pain, shortness of breath or trouble breathing, feeling faint or lightheaded Kidney injury--decrease in the amount of urine, swelling of the ankles, hands, or feet Liver  injury--right upper belly pain, loss of appetite, nausea, light-colored stool, dark yellow or brown urine, yellowing skin or eyes, unusual weakness or fatigue Lung injury--shortness of breath or trouble breathing, cough, spitting up blood, chest pain, fever Pulmonary hypertension--shortness of breath, chest pain, fast or irregular heartbeat, feeling faint or lightheaded, fatigue, swelling of the ankles or feet Stomach pain, bloody diarrhea, pale skin, unusual weakness or fatigue, decrease in the amount of urine, which may be signs of hemolytic uremic syndrome Sudden and severe headache, confusion, change in vision, seizures, which may be signs of posterior reversible encephalopathy syndrome (PRES) TTP--purple spots on the skin or inside the mouth, pale skin, yellowing skin or eyes, unusual weakness or fatigue, fever, fast or irregular heartbeat, confusion, change in vision, trouble speaking, trouble walking Tumor lysis syndrome (TLS)--nausea, vomiting, diarrhea, decrease in the amount of urine, dark urine, unusual weakness or fatigue, confusion, muscle pain or cramps, fast or irregular heartbeat, joint pain Side effects that usually do not require medical attention (report to your care team if they continue or are bothersome): Diarrhea Fatigue Nausea Trouble sleeping This list may not describe all possible side effects. Call your doctor for medical advice about side effects. You may report side effects to FDA at 1-800-FDA-1088. Where should I keep my medication? This medication is given in a hospital or clinic. It will not be stored at home. NOTE: This sheet is a summary. It may not cover all possible information. If you have questions about this medicine, talk to your doctor, pharmacist, or health care provider.  2023 Elsevier/Gold Standard (2022-02-01 00:00:00)  Cyclophosphamide Injection What is this medication? CYCLOPHOSPHAMIDE (sye kloe FOSS fa mide) treats some types of cancer. It works by  slowing down the growth of cancer cells. This medicine may be used for other purposes; ask your health care provider or pharmacist if you have questions. COMMON BRAND NAME(S): Cyclophosphamide, Cytoxan, Neosar What should I tell my care team before I take this medication? They need to know if you have any of these conditions: Heart disease Irregular heartbeat or rhythm Infection Kidney problems Liver disease Low blood cell levels (white cells, platelets, or red blood cells) Lung disease Previous radiation Trouble passing urine An unusual or allergic reaction to cyclophosphamide, other medications, foods, dyes, or preservatives Pregnant or trying to get pregnant Breast-feeding How should I use this medication? This medication is injected into a vein. It is given by your care team in a hospital or clinic setting. Talk to your care team about the use of this medication in children. Special care may be needed. Overdosage: If you think you have taken too much of this medicine contact a poison control center or emergency room at once. NOTE: This medicine is only for you. Do not share this medicine with others. What if I miss a dose? Keep appointments for follow-up doses. It is important not to miss your dose. Call your care team if you are unable to keep an appointment. What may  interact with this medication? Amphotericin Pharaoh Amiodarone Azathioprine Certain antivirals for HIV or hepatitis Certain medications for blood pressure, such as enalapril, lisinopril, quinapril Cyclosporine Diuretics Etanercept Indomethacin Medications that relax muscles Metronidazole Natalizumab Tamoxifen Warfarin This list may not describe all possible interactions. Give your health care provider a list of all the medicines, herbs, non-prescription drugs, or dietary supplements you use. Also tell them if you smoke, drink alcohol, or use illegal drugs. Some items may interact with your medicine. What should I  watch for while using this medication? This medication may make you feel generally unwell. This is not uncommon as chemotherapy can affect healthy cells as well as cancer cells. Report any side effects. Continue your course of treatment even though you feel ill unless your care team tells you to stop. You may need blood work while you are taking this medication. This medication may increase your risk of getting an infection. Call your care team for advice if you get a fever, chills, sore throat, or other symptoms of a cold or flu. Do not treat yourself. Try to avoid being around people who are sick. Avoid taking medications that contain aspirin, acetaminophen, ibuprofen, naproxen, or ketoprofen unless instructed by your care team. These medications may hide a fever. Be careful brushing or flossing your teeth or using a toothpick because you may get an infection or bleed more easily. If you have any dental work done, tell your dentist you are receiving this medication. Drink water or other fluids as directed. Urinate often, even at night. Some products may contain alcohol. Ask your care team if this medication contains alcohol. Be sure to tell all care teams you are taking this medicine. Certain medicines, like metronidazole and disulfiram, can cause an unpleasant reaction when taken with alcohol. The reaction includes flushing, headache, nausea, vomiting, sweating, and increased thirst. The reaction can last from 30 minutes to several hours. Talk to your care team if you wish to become pregnant or think you might be pregnant. This medication can cause serious birth defects if taken during pregnancy and for 1 year after the last dose. A negative pregnancy test is required before starting this medication. A reliable form of contraception is recommended while taking this medication and for 1 year after the last dose. Talk to your care team about reliable forms of contraception. Do not father a child while  taking this medication and for 4 months after the last dose. Use a condom during this time period. Do not breast-feed while taking this medication or for 1 week after the last dose. This medication may cause infertility. Talk to your care team if you are concerned about your fertility. Talk to your care team about your risk of cancer. You may be more at risk for certain types of cancer if you take this medication. What side effects may I notice from receiving this medication? Side effects that you should report to your care team as soon as possible: Allergic reactions--skin rash, itching, hives, swelling of the face, lips, tongue, or throat Dry cough, shortness of breath or trouble breathing Heart failure--shortness of breath, swelling of the ankles, feet, or hands, sudden weight gain, unusual weakness or fatigue Heart muscle inflammation--unusual weakness or fatigue, shortness of breath, chest pain, fast or irregular heartbeat, dizziness, swelling of the ankles, feet, or hands Heart rhythm changes--fast or irregular heartbeat, dizziness, feeling faint or lightheaded, chest pain, trouble breathing Infection--fever, chills, cough, sore throat, wounds that don't heal, pain or trouble when passing urine,  general feeling of discomfort or being unwell Kidney injury--decrease in the amount of urine, swelling of the ankles, hands, or feet Liver injury--right upper belly pain, loss of appetite, nausea, light-colored stool, dark yellow or brown urine, yellowing skin or eyes, unusual weakness or fatigue Low red blood cell level--unusual weakness or fatigue, dizziness, headache, trouble breathing Low sodium level--muscle weakness, fatigue, dizziness, headache, confusion Red or dark brown urine Unusual bruising or bleeding Side effects that usually do not require medical attention (report to your care team if they continue or are bothersome): Hair loss Irregular menstrual cycles or spotting Loss of  appetite Nausea Pain, redness, or swelling with sores inside the mouth or throat Vomiting This list may not describe all possible side effects. Call your doctor for medical advice about side effects. You may report side effects to FDA at 1-800-FDA-1088. Where should I keep my medication? This medication is given in a hospital or clinic. It will not be stored at home. NOTE: This sheet is a summary. It may not cover all possible information. If you have questions about this medicine, talk to your doctor, pharmacist, or health care provider.  2023 Elsevier/Gold Standard (2021-12-14 00:00:00)  

## 2022-04-25 NOTE — Progress Notes (Signed)
Per Dr. Benay Spice: OK to treat w/platelet count 74,000

## 2022-04-26 LAB — KAPPA/LAMBDA LIGHT CHAINS
Kappa free light chain: 8.3 mg/L (ref 3.3–19.4)
Kappa, lambda light chain ratio: 0.06 — ABNORMAL LOW (ref 0.26–1.65)
Lambda free light chains: 131.1 mg/L — ABNORMAL HIGH (ref 5.7–26.3)

## 2022-04-26 LAB — IGG: IgG (Immunoglobin G), Serum: 1318 mg/dL (ref 603–1613)

## 2022-04-27 LAB — PROTEIN ELECTROPHORESIS, SERUM
A/G Ratio: 1.1 (ref 0.7–1.7)
Albumin ELP: 3.5 g/dL (ref 2.9–4.4)
Alpha-1-Globulin: 0.2 g/dL (ref 0.0–0.4)
Alpha-2-Globulin: 0.7 g/dL (ref 0.4–1.0)
Beta Globulin: 1 g/dL (ref 0.7–1.3)
Gamma Globulin: 1.2 g/dL (ref 0.4–1.8)
Globulin, Total: 3.2 g/dL (ref 2.2–3.9)
M-Spike, %: 1 g/dL — ABNORMAL HIGH
Total Protein ELP: 6.7 g/dL (ref 6.0–8.5)

## 2022-04-30 ENCOUNTER — Other Ambulatory Visit: Payer: Self-pay | Admitting: Oncology

## 2022-05-01 ENCOUNTER — Inpatient Hospital Stay: Payer: Medicare Other

## 2022-05-01 VITALS — BP 136/64 | HR 66 | Temp 97.6°F | Resp 18 | Ht 69.0 in | Wt 182.2 lb

## 2022-05-01 DIAGNOSIS — C9000 Multiple myeloma not having achieved remission: Secondary | ICD-10-CM

## 2022-05-01 DIAGNOSIS — Z9481 Bone marrow transplant status: Secondary | ICD-10-CM

## 2022-05-01 DIAGNOSIS — Z5112 Encounter for antineoplastic immunotherapy: Secondary | ICD-10-CM | POA: Diagnosis not present

## 2022-05-01 DIAGNOSIS — G620 Drug-induced polyneuropathy: Secondary | ICD-10-CM

## 2022-05-01 LAB — CBC WITH DIFFERENTIAL (CANCER CENTER ONLY)
Abs Immature Granulocytes: 0.02 10*3/uL (ref 0.00–0.07)
Basophils Absolute: 0 10*3/uL (ref 0.0–0.1)
Basophils Relative: 0 %
Eosinophils Absolute: 0.2 10*3/uL (ref 0.0–0.5)
Eosinophils Relative: 3 %
HCT: 28.9 % — ABNORMAL LOW (ref 39.0–52.0)
Hemoglobin: 9.8 g/dL — ABNORMAL LOW (ref 13.0–17.0)
Immature Granulocytes: 1 %
Lymphocytes Relative: 7 %
Lymphs Abs: 0.3 10*3/uL — ABNORMAL LOW (ref 0.7–4.0)
MCH: 37.8 pg — ABNORMAL HIGH (ref 26.0–34.0)
MCHC: 33.9 g/dL (ref 30.0–36.0)
MCV: 111.6 fL — ABNORMAL HIGH (ref 80.0–100.0)
Monocytes Absolute: 0.7 10*3/uL (ref 0.1–1.0)
Monocytes Relative: 15 %
Neutro Abs: 3.2 10*3/uL (ref 1.7–7.7)
Neutrophils Relative %: 74 %
Platelet Count: 65 10*3/uL — ABNORMAL LOW (ref 150–400)
RBC: 2.59 MIL/uL — ABNORMAL LOW (ref 4.22–5.81)
RDW: 14.9 % (ref 11.5–15.5)
WBC Count: 4.4 10*3/uL (ref 4.0–10.5)
nRBC: 0.7 % — ABNORMAL HIGH (ref 0.0–0.2)

## 2022-05-01 LAB — CMP (CANCER CENTER ONLY)
ALT: 18 U/L (ref 0–44)
AST: 16 U/L (ref 15–41)
Albumin: 4.2 g/dL (ref 3.5–5.0)
Alkaline Phosphatase: 48 U/L (ref 38–126)
Anion gap: 9 (ref 5–15)
BUN: 34 mg/dL — ABNORMAL HIGH (ref 8–23)
CO2: 23 mmol/L (ref 22–32)
Calcium: 9.9 mg/dL (ref 8.9–10.3)
Chloride: 103 mmol/L (ref 98–111)
Creatinine: 1.15 mg/dL (ref 0.61–1.24)
GFR, Estimated: >60 mL/min (ref >60)
Glucose, Bld: 101 mg/dL — ABNORMAL HIGH (ref 70–99)
Potassium: 4.4 mmol/L (ref 3.5–5.1)
Sodium: 135 mmol/L (ref 135–145)
Total Bilirubin: 0.6 mg/dL (ref 0.3–1.2)
Total Protein: 7.2 g/dL (ref 6.5–8.1)

## 2022-05-01 MED ORDER — DEXAMETHASONE 4 MG PO TABS
10.0000 mg | ORAL_TABLET | Freq: Once | ORAL | Status: AC
  Administered 2022-05-01: 10 mg via ORAL

## 2022-05-01 MED ORDER — DEXTROSE 5 % IV SOLN
56.0000 mg/m2 | Freq: Once | INTRAVENOUS | Status: AC
  Administered 2022-05-01: 110 mg via INTRAVENOUS
  Filled 2022-05-01: qty 30

## 2022-05-01 MED ORDER — SODIUM CHLORIDE 0.9 % IV SOLN: Freq: Once | INTRAVENOUS | Status: AC

## 2022-05-01 MED ORDER — SODIUM CHLORIDE 0.9 % IV SOLN
250.0000 mg/m2 | Freq: Once | INTRAVENOUS | Status: AC
  Administered 2022-05-01: 500 mg via INTRAVENOUS
  Filled 2022-05-01: qty 25

## 2022-05-01 NOTE — Patient Instructions (Signed)
[facility]   Discharge Instructions: Thank you for choosing [facility] to provide your oncology and hematology care.   If you have a lab appointment with the [facility], please go directly to the [facility] and check in at the registration area.   Wear comfortable clothing and clothing appropriate for easy access to any Portacath or PICC line.   We strive to give you quality time with your provider. You may need to reschedule your appointment if you arrive late (15 or more minutes).  Arriving late affects you and other patients whose appointments are after yours.  Also, if you miss three or more appointments without notifying the office, you may be dismissed from the clinic at the provider's discretion.      For prescription refill requests, have your pharmacy contact our office and allow 72 hours for refills to be completed.    Today you received the following chemotherapy and/or immunotherapy agents Carfilzomib (KYPROLIS) & Cyclophosphamide (CYTOXAN).      To help prevent nausea and vomiting after your treatment, we encourage you to take your nausea medication as directed.  BELOW ARE SYMPTOMS THAT SHOULD BE REPORTED IMMEDIATELY: *FEVER GREATER THAN 100.4 F (38 C) OR HIGHER *CHILLS OR SWEATING *NAUSEA AND VOMITING THAT IS NOT CONTROLLED WITH YOUR NAUSEA MEDICATION *UNUSUAL SHORTNESS OF BREATH *UNUSUAL BRUISING OR BLEEDING *URINARY PROBLEMS (pain or burning when urinating, or frequent urination) *BOWEL PROBLEMS (unusual diarrhea, constipation, pain near the anus) TENDERNESS IN MOUTH AND THROAT WITH OR WITHOUT PRESENCE OF ULCERS (sore throat, sores in mouth, or a toothache) UNUSUAL RASH, SWELLING OR PAIN  UNUSUAL VAGINAL DISCHARGE OR ITCHING   Items with * indicate a potential emergency and should be followed up as soon as possible or go to the Emergency Department if any problems should occur.  Please show the CHEMOTHERAPY ALERT CARD or  IMMUNOTHERAPY ALERT CARD at check-in to the Emergency Department and triage nurse.  Should you have questions after your visit or need to cancel or reschedule your appointment, please contact [facility]  Dept: [phone]  and follow the prompts.  Office hours are 8:00 a.m. to 4:30 p.m. Monday - Friday. Please note that voicemails left after 4:00 p.m. may not be returned until the following business day.  We are closed weekends and major holidays. You have access to a nurse at all times for urgent questions. Please call the main number to the clinic Dept: [phone] and follow the prompts.   For any non-urgent questions, you may also contact your provider using MyChart. We now offer e-Visits for anyone 59 and older to request care online for non-urgent symptoms. For details visit [email or web address].   Also download the MyChart app! Go to the app store, search "MyChart", open the app, select [facility], and log in with your MyChart username and password.  Masks are optional in the cancer centers. If you would like for your care team to wear a mask while they are taking care of you, please let them know. You may have one support person who is at least 73 years old accompany you for your appointments.  Carfilzomib Injection What is this medication? CARFILZOMIB (kar FILZ oh mib) treats multiple myeloma, a type of bone marrow cancer. It works by blocking a protein that causes cancer cells to grow and multiply. This helps to slow or stop the spread of cancer cells. This medicine may be used for other purposes; ask your health  care provider or pharmacist if you have questions. COMMON BRAND NAME(S): KYPROLIS What should I tell my care team before I take this medication? They need to know if you have any of these conditions: Heart disease History of blood clots Irregular heartbeat Kidney disease Liver disease Lung or breathing disease An unusual or allergic  reaction to carfilzomib, or other medications, foods, dyes, or preservatives If you or your partner are pregnant or trying to get pregnant Breastfeeding How should I use this medication? This medication is injected into a vein. It is given by your care team in a hospital or clinic setting. Talk to your care team about the use of this medication in children. Special care may be needed. Overdosage: If you think you have taken too much of this medicine contact a poison control center or emergency room at once. NOTE: This medicine is only for you. Do not share this medicine with others. What if I miss a dose? Keep appointments for follow-up doses. It is important not to miss your dose. Call your care team if you are unable to keep an appointment. What may interact with this medication? Interactions are not expected. This list may not describe all possible interactions. Give your health care provider a list of all the medicines, herbs, non-prescription drugs, or dietary supplements you use. Also tell them if you smoke, drink alcohol, or use illegal drugs. Some items may interact with your medicine. What should I watch for while using this medication? Your condition will be monitored carefully while you are receiving this medication. You may need blood work while taking this medication. Check with your care team if you have severe diarrhea, nausea, and vomiting, or if you sweat a lot. The loss of too much body fluid may make it dangerous for you to take this medication. This medication may affect your coordination, reaction time, or judgment. Do not drive or operate machinery until you know how this medication affects you. Sit up or stand slowly to reduce the risk of dizzy or fainting spells. Drinking alcohol with this medication can increase the risk of these side effects. Talk to your care team if you may be pregnant. Serious birth defects can occur if you take this medication during pregnancy and for 6  months after the last dose. You will need a negative pregnancy test before starting this medication. Contraception is recommended while taking this medication and for 6 months after the last dose. Your care team can help you find an option that works for you. If your partner can get pregnant, use a condom during sex while taking this medication and for 3 months after the last dose. Do not breastfeed while taking this medication and for 2 weeks after the last dose. This medication may cause infertility. Talk to your care team if you are concerned about your fertility. What side effects may I notice from receiving this medication? Side effects that you should report to your care team as soon as possible: Allergic reactions--skin rash, itching, hives, swelling of the face, lips, tongue, or throat Bleeding--bloody or black, tar-like stools, vomiting blood or brown material that looks like coffee grounds, red or dark brown urine, small red or purple spots on skin, unusual bruising or bleeding Blood clot--pain, swelling, or warmth in the leg, shortness of breath, chest pain Dizziness, loss of balance or coordination, confusion or trouble speaking Heart attack--pain or tightness in the chest, shoulders, arms, or jaw, nausea, shortness of breath, cold or clammy skin, feeling faint  or lightheaded Heart failure--shortness of breath, swelling of the ankles, feet, or hands, sudden weight gain, unusual weakness or fatigue Heart rhythm changes--fast or irregular heartbeat, dizziness, feeling faint or lightheaded, chest pain, trouble breathing Increase in blood pressure Infection--fever, chills, cough, sore throat, wounds that don't heal, pain or trouble when passing urine, general feeling of discomfort or being unwell Infusion reactions--chest pain, shortness of breath or trouble breathing, feeling faint or lightheaded Kidney injury--decrease in the amount of urine, swelling of the ankles, hands, or feet Liver  injury--right upper belly pain, loss of appetite, nausea, light-colored stool, dark yellow or brown urine, yellowing skin or eyes, unusual weakness or fatigue Lung injury--shortness of breath or trouble breathing, cough, spitting up blood, chest pain, fever Pulmonary hypertension--shortness of breath, chest pain, fast or irregular heartbeat, feeling faint or lightheaded, fatigue, swelling of the ankles or feet Stomach pain, bloody diarrhea, pale skin, unusual weakness or fatigue, decrease in the amount of urine, which may be signs of hemolytic uremic syndrome Sudden and severe headache, confusion, change in vision, seizures, which may be signs of posterior reversible encephalopathy syndrome (PRES) TTP--purple spots on the skin or inside the mouth, pale skin, yellowing skin or eyes, unusual weakness or fatigue, fever, fast or irregular heartbeat, confusion, change in vision, trouble speaking, trouble walking Tumor lysis syndrome (TLS)--nausea, vomiting, diarrhea, decrease in the amount of urine, dark urine, unusual weakness or fatigue, confusion, muscle pain or cramps, fast or irregular heartbeat, joint pain Side effects that usually do not require medical attention (report to your care team if they continue or are bothersome): Diarrhea Fatigue Nausea Trouble sleeping This list may not describe all possible side effects. Call your doctor for medical advice about side effects. You may report side effects to FDA at 1-800-FDA-1088. Where should I keep my medication? This medication is given in a hospital or clinic. It will not be stored at home. NOTE: This sheet is a summary. It may not cover all possible information. If you have questions about this medicine, talk to your doctor, pharmacist, or health care provider.  2023 Elsevier/Gold Standard (2022-02-01 00:00:00)  Cyclophosphamide Injection What is this medication? CYCLOPHOSPHAMIDE (sye kloe FOSS fa mide) treats some types of cancer. It works by  slowing down the growth of cancer cells. This medicine may be used for other purposes; ask your health care provider or pharmacist if you have questions. COMMON BRAND NAME(S): Cyclophosphamide, Cytoxan, Neosar What should I tell my care team before I take this medication? They need to know if you have any of these conditions: Heart disease Irregular heartbeat or rhythm Infection Kidney problems Liver disease Low blood cell levels (white cells, platelets, or red blood cells) Lung disease Previous radiation Trouble passing urine An unusual or allergic reaction to cyclophosphamide, other medications, foods, dyes, or preservatives Pregnant or trying to get pregnant Breast-feeding How should I use this medication? This medication is injected into a vein. It is given by your care team in a hospital or clinic setting. Talk to your care team about the use of this medication in children. Special care may be needed. Overdosage: If you think you have taken too much of this medicine contact a poison control center or emergency room at once. NOTE: This medicine is only for you. Do not share this medicine with others. What if I miss a dose? Keep appointments for follow-up doses. It is important not to miss your dose. Call your care team if you are unable to keep an appointment. What may  interact with this medication? Amphotericin Pharaoh Amiodarone Azathioprine Certain antivirals for HIV or hepatitis Certain medications for blood pressure, such as enalapril, lisinopril, quinapril Cyclosporine Diuretics Etanercept Indomethacin Medications that relax muscles Metronidazole Natalizumab Tamoxifen Warfarin This list may not describe all possible interactions. Give your health care provider a list of all the medicines, herbs, non-prescription drugs, or dietary supplements you use. Also tell them if you smoke, drink alcohol, or use illegal drugs. Some items may interact with your medicine. What should I  watch for while using this medication? This medication may make you feel generally unwell. This is not uncommon as chemotherapy can affect healthy cells as well as cancer cells. Report any side effects. Continue your course of treatment even though you feel ill unless your care team tells you to stop. You may need blood work while you are taking this medication. This medication may increase your risk of getting an infection. Call your care team for advice if you get a fever, chills, sore throat, or other symptoms of a cold or flu. Do not treat yourself. Try to avoid being around people who are sick. Avoid taking medications that contain aspirin, acetaminophen, ibuprofen, naproxen, or ketoprofen unless instructed by your care team. These medications may hide a fever. Be careful brushing or flossing your teeth or using a toothpick because you may get an infection or bleed more easily. If you have any dental work done, tell your dentist you are receiving this medication. Drink water or other fluids as directed. Urinate often, even at night. Some products may contain alcohol. Ask your care team if this medication contains alcohol. Be sure to tell all care teams you are taking this medicine. Certain medicines, like metronidazole and disulfiram, can cause an unpleasant reaction when taken with alcohol. The reaction includes flushing, headache, nausea, vomiting, sweating, and increased thirst. The reaction can last from 30 minutes to several hours. Talk to your care team if you wish to become pregnant or think you might be pregnant. This medication can cause serious birth defects if taken during pregnancy and for 1 year after the last dose. A negative pregnancy test is required before starting this medication. A reliable form of contraception is recommended while taking this medication and for 1 year after the last dose. Talk to your care team about reliable forms of contraception. Do not father a child while  taking this medication and for 4 months after the last dose. Use a condom during this time period. Do not breast-feed while taking this medication or for 1 week after the last dose. This medication may cause infertility. Talk to your care team if you are concerned about your fertility. Talk to your care team about your risk of cancer. You may be more at risk for certain types of cancer if you take this medication. What side effects may I notice from receiving this medication? Side effects that you should report to your care team as soon as possible: Allergic reactions--skin rash, itching, hives, swelling of the face, lips, tongue, or throat Dry cough, shortness of breath or trouble breathing Heart failure--shortness of breath, swelling of the ankles, feet, or hands, sudden weight gain, unusual weakness or fatigue Heart muscle inflammation--unusual weakness or fatigue, shortness of breath, chest pain, fast or irregular heartbeat, dizziness, swelling of the ankles, feet, or hands Heart rhythm changes--fast or irregular heartbeat, dizziness, feeling faint or lightheaded, chest pain, trouble breathing Infection--fever, chills, cough, sore throat, wounds that don't heal, pain or trouble when passing urine,  general feeling of discomfort or being unwell Kidney injury--decrease in the amount of urine, swelling of the ankles, hands, or feet Liver injury--right upper belly pain, loss of appetite, nausea, light-colored stool, dark yellow or brown urine, yellowing skin or eyes, unusual weakness or fatigue Low red blood cell level--unusual weakness or fatigue, dizziness, headache, trouble breathing Low sodium level--muscle weakness, fatigue, dizziness, headache, confusion Red or dark brown urine Unusual bruising or bleeding Side effects that usually do not require medical attention (report to your care team if they continue or are bothersome): Hair loss Irregular menstrual cycles or spotting Loss of  appetite Nausea Pain, redness, or swelling with sores inside the mouth or throat Vomiting This list may not describe all possible side effects. Call your doctor for medical advice about side effects. You may report side effects to FDA at 1-800-FDA-1088. Where should I keep my medication? This medication is given in a hospital or clinic. It will not be stored at home. NOTE: This sheet is a summary. It may not cover all possible information. If you have questions about this medicine, talk to your doctor, pharmacist, or health care provider.  2023 Elsevier/Gold Standard (2021-12-14 00:00:00)  

## 2022-05-01 NOTE — Progress Notes (Signed)
Per Dr. Benay Spice: OK to treat with platelet count 65,000

## 2022-05-14 ENCOUNTER — Other Ambulatory Visit: Payer: Self-pay | Admitting: Oncology

## 2022-05-15 ENCOUNTER — Inpatient Hospital Stay: Payer: Medicare Other

## 2022-05-15 ENCOUNTER — Other Ambulatory Visit (HOSPITAL_BASED_OUTPATIENT_CLINIC_OR_DEPARTMENT_OTHER): Payer: Self-pay

## 2022-05-15 ENCOUNTER — Inpatient Hospital Stay (HOSPITAL_BASED_OUTPATIENT_CLINIC_OR_DEPARTMENT_OTHER): Payer: Medicare Other | Admitting: Nurse Practitioner

## 2022-05-15 ENCOUNTER — Encounter: Payer: Self-pay | Admitting: Nurse Practitioner

## 2022-05-15 VITALS — BP 146/62 | HR 60 | Temp 98.1°F | Resp 18 | Wt 184.4 lb

## 2022-05-15 DIAGNOSIS — C9000 Multiple myeloma not having achieved remission: Secondary | ICD-10-CM

## 2022-05-15 DIAGNOSIS — Z5112 Encounter for antineoplastic immunotherapy: Secondary | ICD-10-CM | POA: Diagnosis not present

## 2022-05-15 LAB — CBC WITH DIFFERENTIAL (CANCER CENTER ONLY)
Abs Immature Granulocytes: 0 10*3/uL (ref 0.00–0.07)
Basophils Absolute: 0 10*3/uL (ref 0.0–0.1)
Basophils Relative: 1 %
Eosinophils Absolute: 0.1 10*3/uL (ref 0.0–0.5)
Eosinophils Relative: 5 %
HCT: 26.8 % — ABNORMAL LOW (ref 39.0–52.0)
Hemoglobin: 9.2 g/dL — ABNORMAL LOW (ref 13.0–17.0)
Immature Granulocytes: 0 %
Lymphocytes Relative: 9 %
Lymphs Abs: 0.2 10*3/uL — ABNORMAL LOW (ref 0.7–4.0)
MCH: 38.2 pg — ABNORMAL HIGH (ref 26.0–34.0)
MCHC: 34.3 g/dL (ref 30.0–36.0)
MCV: 111.2 fL — ABNORMAL HIGH (ref 80.0–100.0)
Monocytes Absolute: 0.7 10*3/uL (ref 0.1–1.0)
Monocytes Relative: 25 %
Neutro Abs: 1.6 10*3/uL — ABNORMAL LOW (ref 1.7–7.7)
Neutrophils Relative %: 60 %
Platelet Count: 100 10*3/uL — ABNORMAL LOW (ref 150–400)
RBC: 2.41 MIL/uL — ABNORMAL LOW (ref 4.22–5.81)
RDW: 14.6 % (ref 11.5–15.5)
WBC Count: 2.7 10*3/uL — ABNORMAL LOW (ref 4.0–10.5)
nRBC: 0 % (ref 0.0–0.2)

## 2022-05-15 LAB — CMP (CANCER CENTER ONLY)
ALT: 15 U/L (ref 0–44)
AST: 21 U/L (ref 15–41)
Albumin: 4.1 g/dL (ref 3.5–5.0)
Alkaline Phosphatase: 53 U/L (ref 38–126)
Anion gap: 8 (ref 5–15)
BUN: 26 mg/dL — ABNORMAL HIGH (ref 8–23)
CO2: 23 mmol/L (ref 22–32)
Calcium: 9.4 mg/dL (ref 8.9–10.3)
Chloride: 106 mmol/L (ref 98–111)
Creatinine: 1.1 mg/dL (ref 0.61–1.24)
GFR, Estimated: >60 mL/min (ref >60)
Glucose, Bld: 84 mg/dL (ref 70–99)
Potassium: 4.4 mmol/L (ref 3.5–5.1)
Sodium: 137 mmol/L (ref 135–145)
Total Bilirubin: 0.5 mg/dL (ref 0.3–1.2)
Total Protein: 7 g/dL (ref 6.5–8.1)

## 2022-05-15 NOTE — Progress Notes (Signed)
[facility] OFFICE PROGRESS NOTE   Diagnosis: Multiple myeloma  INTERVAL HISTORY:   Andre [name] returns as scheduled.  He completed another cycle of carfilzomib/Cytoxan/Decadron 05/01/2022.  He denies nausea/vomiting.  No mouth sores.  He had some loose stools last night.  Typically has a formed stools.  He denies pain.  He has a good appetite.  No fever, cough, shortness of breath.  Scattered areas of pruritus, improved.  Objective:  Vital signs in last 24 hours:  Blood pressure (!) 146/62, pulse 60, temperature 98.1 F (36.7 C), resp. rate 18, weight 184 lb 6.4 oz (83.6 kg), SpO2 100 %.    HEENT: No thrush or ulcers. Resp: Lungs clear bilaterally. Cardio: Regular rate and rhythm. GI: No hepatosplenomegaly. Vascular: No leg edema.    Lab Results:  Lab Results  Component Value Date   WBC 2.7 (L) 05/15/2022   HGB 9.2 (L) 05/15/2022   HCT 26.8 (L) 05/15/2022   MCV 111.2 (H) 05/15/2022   PLT 100 (L) 05/15/2022   NEUTROABS 1.6 (L) 05/15/2022    Imaging:  No results found.  Medications: I have reviewed the patient's current medications.  Assessment/Plan: Multiple myeloma- IgG lambda  bortezomib (subcutaneously), lenalidomide, and dexamethasone, with repeat bone marrow biopsy May of 2012 showing 10% plasmacytosis   (2) high-dose chemotherapy with BCNU and melphalan at [facility], followed by stem cell rescue July of 2012   (3) on zoledronic acid started December of 2012, initially monthly, currently given every 3 months, most recent dose  12/07/2015   (4) low-dose lenalidomide resumed April 2013, interrupted several times.  Resumed again on 02/19/2013, ata dose of 5 mg daily, 21 days on and 7 days off, later further reduced to 7 days on, 7 days off   (5) CNS symptoms and abnormal brain MRI extensively evaluated by neurology with no definitive diagnosis established   (6) rising M spike noted June 2014 but did not meet criteria for carfilzomib study   (7) on  lenalidomide 25 mg daily, 14 days on, 7 days off, starting 04/18/2013, interrupted December 2014 because of rash;             (a) resumed January 2015 at 10 mg/ day at 21 days on/ 7 days off             (Andre [name]) starting 08/18/2014 decreased to 10 mg/ day 14 days on, 7 days off because of cytopenias             (c) as of February 2016 was on 5 mg daily 7 days on 7 days off             (d) lenalidomide discontinued December 2016 with evidence of disease progression   (8) transient global amnesia 05/29/2015, resolved without intervention   (9) starting PVD 10/25/2015 w ASA 325 thromboprophylaxis, valacyclovir prophylaxis, last dose 12/17/2015             (a) pomalidomide 4 mg/d days 1-14             (Andre [name]) bortezomib sQ days 2,5,9,12 of each 21 day cycle             (c) dexamethasome 20 mg two days a week             (d) dexamethasone bortezomib and pomalidomide discontinued late December 2018 with poor tolerance   (10) metapneumovirus pneumonia April 2017             (a) completing course of steroids and week  of bactrim mid April 2017   (11) status post second autologous transplant at [facility] 02/04/2016(preparatory regimen melphalan 200 mg/m)             (a) received twelve-month vaccinations 03/14/2017 (DPT, Haemophilus, Pneumovax 13, polio)             (Andre [name]) 14 months injections 05/04/2016 include DTaP, Hib conjugate, HepB energex [name] 20 mcg/ml, Prevnar 13             (c) 24 month vaccines due at [facility] June 2019   (12) maintenance therapy started November 2017, consisting of             (a) bortezomib 1.3 mg/M2 every 14 days, first dose 07/27/2016             (Andre [name]) pomalidomide 1 mg days 1-21 Q28 days, started 07/19/2016             (c) zolendronate monthly started 07/27/2016 (previously Q 3 months) however patient unable to tolerate, and changed back to q3 months in April, 2018             (d) Bortezomib changed to monthly as of June 2018 because of tolerance issues, however discontinued after 09/14/2017  dose because of a rise in his M spike             (e) pomalidomide held after 10/18/2017 in preparation for possible study at [facility] (venetoclax)--never resumed             (f) with numbers actually improved off treatment, resumed every 2-week bortezomib 12/25/2017             (g) changed to every 3-week bortezomib as of 03/17/2019             (h) briefly on weekly treatments times 22 October 2020 due to increase in M spike             (I) maintenance therapy discontinued with evidence of progression   (13) bortezomib/daratumumab/dexamethasone started 12/20/2020.             (a) Decadron dose dropped to 10 mg day of and day following treatment starting May 2022             (Andre [name]) day 8 bortezomib omitted beginning with the June cycle per patient preference             (C) treatment changed to monthly daratumumab/Velcade/Decadron beginning 06/27/2021-patient decision (14) weekly Velcade/Cytoxan beginning 11/14/2021 (15) changed to Cytoxan/carfilzomib/Decadron 12/19/2021-treatment placed on hold 05/15/2022      Disposition: Andre [name] appears stable.  He is on active treatment with Cytoxan/Carfilzomib/Decadron, scheduled to begin a new cycle today.  He reports the plan is to proceed with CAR-T therapy.  We decided to place treatment here on hold until we are able to receive confirmation from Dr. Philipp Ovens office regarding the plan going forward.  He is in agreement.  We scheduled a follow-up appointment in mid October.  We will adjust appointments accordingly pending discussion with Dr. Philipp Ovens office.  Patient seen with Dr. Benay Spice.    [name] ANP/GNP-BC   05/15/2022  10:12 AM This was a shared visit with [name].  Andre [name] has continued treatment with Cytoxan/carfilzomib/Decadron.  The serum free lambda light chains were stable on 04/25/2022.  He is undergoing planning for CAR-T therapy with Dr. Philipp Ovens.  We will place the current treatment on hold.  We will contact Dr. Philipp Ovens office to  review the treatment plan and coordinate further treatment  here.  I was present for greater than 50% of today's visit.  I performed medical decision making.  Julieanne Manson, MD

## 2022-05-16 ENCOUNTER — Other Ambulatory Visit: Payer: Self-pay

## 2022-05-16 LAB — KAPPA/LAMBDA LIGHT CHAINS
Kappa free light chain: 7.3 mg/L (ref 3.3–19.4)
Kappa, lambda light chain ratio: 0.06 — ABNORMAL LOW (ref 0.26–1.65)
Lambda free light chains: 116.5 mg/L — ABNORMAL HIGH (ref 5.7–26.3)

## 2022-05-16 LAB — IGG: IgG (Immunoglobin G), Serum: 1345 mg/dL (ref 603–1613)

## 2022-05-18 LAB — PROTEIN ELECTROPHORESIS, SERUM
A/G Ratio: 1.2 (ref 0.7–1.7)
Albumin ELP: 3.6 g/dL (ref 2.9–4.4)
Alpha-1-Globulin: 0.2 g/dL (ref 0.0–0.4)
Alpha-2-Globulin: 0.7 g/dL (ref 0.4–1.0)
Beta Globulin: 1 g/dL (ref 0.7–1.3)
Gamma Globulin: 1.1 g/dL (ref 0.4–1.8)
Globulin, Total: 3.1 g/dL (ref 2.2–3.9)
M-Spike, %: 0.9 g/dL — ABNORMAL HIGH
Total Protein ELP: 6.7 g/dL (ref 6.0–8.5)

## 2022-05-19 ENCOUNTER — Other Ambulatory Visit: Payer: Self-pay

## 2022-05-19 ENCOUNTER — Other Ambulatory Visit (HOSPITAL_BASED_OUTPATIENT_CLINIC_OR_DEPARTMENT_OTHER): Payer: Self-pay

## 2022-05-21 ENCOUNTER — Other Ambulatory Visit: Payer: Self-pay | Admitting: Oncology

## 2022-05-21 DIAGNOSIS — Z9481 Bone marrow transplant status: Secondary | ICD-10-CM

## 2022-05-21 DIAGNOSIS — C9000 Multiple myeloma not having achieved remission: Secondary | ICD-10-CM

## 2022-05-21 DIAGNOSIS — T451X5A Adverse effect of antineoplastic and immunosuppressive drugs, initial encounter: Secondary | ICD-10-CM

## 2022-05-21 NOTE — Progress Notes (Signed)
OFF PATHWAY REGIMEN - Multiple Myeloma and Other Plasma Cell Dyscrasias  No Change  Continue With Treatment as Ordered.  Original Decision Date/Time: 12/12/2021 11:03   [id number] (Carfilzomib 20/36 mg/m2 IV + Cyclophosphamide 500 mg PO + Dexamethasone 40 mg PO) q28 Days x 6 Cycles:   Cycle 1: A cycle is 28 days:     Cyclophosphamide      Dexamethasone      Carfilzomib      Carfilzomib    Cycles 2 through 6: A cycle is every 28 days:     Cyclophosphamide      Dexamethasone      Carfilzomib   **Always confirm dose/schedule in your pharmacy ordering system**  Patient Characteristics: Multiple Myeloma, Relapsed / Refractory, Second through [place] of Therapy, Fit or Candidate for Triplet Therapy, Lenalidomide-Refractory or Lenalidomide-based Regimen Not Preferred, Not a Candidate for Anti-CD38 Antibody Disease Classification: Multiple Myeloma R-ISS Staging: III Therapeutic Status: Relapsed Line of Therapy: Fourth Line Anti-CD38 Antibody Candidacy: Not a Candidate for Anti-CD38 Antibody Lenalidomide-based Regimen Preference/Candidacy: Lenalidomide-based Regimen Not Preferred Intent of Therapy: Non-Curative / Palliative Intent, Discussed with Patient

## 2022-05-23 ENCOUNTER — Ambulatory Visit: Payer: Medicare Other

## 2022-05-23 ENCOUNTER — Other Ambulatory Visit: Payer: Medicare Other

## 2022-05-24 ENCOUNTER — Other Ambulatory Visit: Payer: Self-pay | Admitting: Oncology

## 2022-05-24 ENCOUNTER — Other Ambulatory Visit: Payer: Self-pay

## 2022-05-24 ENCOUNTER — Inpatient Hospital Stay: Payer: Medicare Other | Attending: Oncology

## 2022-05-24 ENCOUNTER — Other Ambulatory Visit (HOSPITAL_BASED_OUTPATIENT_CLINIC_OR_DEPARTMENT_OTHER): Payer: Self-pay

## 2022-05-24 ENCOUNTER — Inpatient Hospital Stay: Payer: Medicare Other

## 2022-05-24 VITALS — BP 142/62 | HR 60 | Temp 98.2°F | Resp 20 | Ht 69.0 in | Wt 185.6 lb

## 2022-05-24 DIAGNOSIS — C9000 Multiple myeloma not having achieved remission: Secondary | ICD-10-CM | POA: Insufficient documentation

## 2022-05-24 DIAGNOSIS — Z79899 Other long term (current) drug therapy: Secondary | ICD-10-CM | POA: Diagnosis not present

## 2022-05-24 DIAGNOSIS — Z5112 Encounter for antineoplastic immunotherapy: Secondary | ICD-10-CM | POA: Diagnosis present

## 2022-05-24 DIAGNOSIS — G620 Drug-induced polyneuropathy: Secondary | ICD-10-CM

## 2022-05-24 DIAGNOSIS — Z9481 Bone marrow transplant status: Secondary | ICD-10-CM

## 2022-05-24 LAB — CMP (CANCER CENTER ONLY)
ALT: 15 U/L (ref 0–44)
AST: 19 U/L (ref 15–41)
Albumin: 4.2 g/dL (ref 3.5–5.0)
Alkaline Phosphatase: 47 U/L (ref 38–126)
Anion gap: 8 (ref 5–15)
BUN: 33 mg/dL — ABNORMAL HIGH (ref 8–23)
CO2: 21 mmol/L — ABNORMAL LOW (ref 22–32)
Calcium: 9.8 mg/dL (ref 8.9–10.3)
Chloride: 108 mmol/L (ref 98–111)
Creatinine: 1.05 mg/dL (ref 0.61–1.24)
GFR, Estimated: >60 mL/min (ref >60)
Glucose, Bld: 99 mg/dL (ref 70–99)
Potassium: 4.1 mmol/L (ref 3.5–5.1)
Sodium: 137 mmol/L (ref 135–145)
Total Bilirubin: 0.4 mg/dL (ref 0.3–1.2)
Total Protein: 7.2 g/dL (ref 6.5–8.1)

## 2022-05-24 LAB — CBC WITH DIFFERENTIAL (CANCER CENTER ONLY)
Abs Immature Granulocytes: 0.01 10*3/uL (ref 0.00–0.07)
Basophils Absolute: 0 10*3/uL (ref 0.0–0.1)
Basophils Relative: 0 %
Eosinophils Absolute: 0.1 10*3/uL (ref 0.0–0.5)
Eosinophils Relative: 3 %
HCT: 27.2 % — ABNORMAL LOW (ref 39.0–52.0)
Hemoglobin: 9.1 g/dL — ABNORMAL LOW (ref 13.0–17.0)
Immature Granulocytes: 0 %
Lymphocytes Relative: 6 %
Lymphs Abs: 0.3 10*3/uL — ABNORMAL LOW (ref 0.7–4.0)
MCH: 37.3 pg — ABNORMAL HIGH (ref 26.0–34.0)
MCHC: 33.5 g/dL (ref 30.0–36.0)
MCV: 111.5 fL — ABNORMAL HIGH (ref 80.0–100.0)
Monocytes Absolute: 0.9 10*3/uL (ref 0.1–1.0)
Monocytes Relative: 17 %
Neutro Abs: 4.1 10*3/uL (ref 1.7–7.7)
Neutrophils Relative %: 74 %
Platelet Count: 100 10*3/uL — ABNORMAL LOW (ref 150–400)
RBC: 2.44 MIL/uL — ABNORMAL LOW (ref 4.22–5.81)
RDW: 13.9 % (ref 11.5–15.5)
WBC Count: 5.5 10*3/uL (ref 4.0–10.5)
nRBC: 0 % (ref 0.0–0.2)

## 2022-05-24 MED ORDER — DEXAMETHASONE 4 MG PO TABS
10.0000 mg | ORAL_TABLET | Freq: Once | ORAL | Status: AC
  Administered 2022-05-24: 10 mg via ORAL
  Filled 2022-05-24: qty 3

## 2022-05-24 MED ORDER — SODIUM CHLORIDE 0.9 % IV SOLN: Freq: Once | INTRAVENOUS | Status: AC

## 2022-05-24 MED ORDER — SODIUM CHLORIDE 0.9 % IV SOLN: 250.0000 mg/m2 | Freq: Once | INTRAVENOUS | Status: DC

## 2022-05-24 MED ORDER — DEXTROSE 5 % IV SOLN
56.0000 mg/m2 | Freq: Once | INTRAVENOUS | Status: AC
  Administered 2022-05-24: 110 mg via INTRAVENOUS
  Filled 2022-05-24: qty 30

## 2022-05-24 NOTE — Patient Instructions (Signed)
[name]   Discharge Instructions: Thank you for choosing [facility] to provide your oncology and hematology care.   If you have a lab appointment with the Cancer Center, please go directly to the Cancer Center and check in at the registration area.   Wear comfortable clothing and clothing appropriate for easy access to any Portacath or PICC line.   We strive to give you quality time with your provider. You may need to reschedule your appointment if you arrive late (15 or more minutes).  Arriving late affects you and other patients whose appointments are after yours.  Also, if you miss three or more appointments without notifying the office, you may be dismissed from the clinic at the provider's discretion.      For prescription refill requests, have your pharmacy contact our office and allow 72 hours for refills to be completed.    Today you received the following chemotherapy and/or immunotherapy agents Kyprolis.      To help prevent nausea and vomiting after your treatment, we encourage you to take your nausea medication as directed.  BELOW ARE SYMPTOMS THAT SHOULD BE REPORTED IMMEDIATELY: *FEVER GREATER THAN 100.4 F (38 C) OR HIGHER *CHILLS OR SWEATING *NAUSEA AND VOMITING THAT IS NOT CONTROLLED WITH YOUR NAUSEA MEDICATION *UNUSUAL SHORTNESS OF BREATH *UNUSUAL BRUISING OR BLEEDING *URINARY PROBLEMS (pain or burning when urinating, or frequent urination) *BOWEL PROBLEMS (unusual diarrhea, constipation, pain near the anus) TENDERNESS IN MOUTH AND THROAT WITH OR WITHOUT PRESENCE OF ULCERS (sore throat, sores in mouth, or a toothache) UNUSUAL RASH, SWELLING OR PAIN  UNUSUAL VAGINAL DISCHARGE OR ITCHING   Items with * indicate a potential emergency and should be followed up as soon as possible or go to the Emergency Department if any problems should occur.  Please show the CHEMOTHERAPY ALERT CARD or IMMUNOTHERAPY ALERT CARD at check-in to  the Emergency Department and triage nurse.  Should you have questions after your visit or need to cancel or reschedule your appointment, please contact [name]  Dept: [phone]  and follow the prompts.  Office hours are 8:00 a.m. to 4:30 p.m. Monday - Friday. Please note that voicemails left after 4:00 p.m. may not be returned until the following business day.  We are closed weekends and major holidays. You have access to a nurse at all times for urgent questions. Please call the main number to the clinic Dept: [phone] and follow the prompts.   For any non-urgent questions, you may also contact your provider using MyChart. We now offer e-Visits for anyone 18 and older to request care online for non-urgent symptoms. For details visit [email or web address].   Also download the MyChart app! Go to the app store, search "MyChart", open the app, select [facility], and log in with your MyChart username and password.  Masks are optional in the cancer centers. If you would like for your care team to wear a mask while they are taking care of you, please let them know. You may have one support person who is at least 73 years old accompany you for your appointments.  Carfilzomib Injection What is this medication? CARFILZOMIB (kar FILZ oh mib) treats multiple myeloma, a type of bone marrow cancer. It works by blocking a protein that causes cancer cells to grow and multiply. This helps to slow or stop the spread of cancer cells. This medicine may be used for other purposes; ask your health care provider or pharmacist   if you have questions. COMMON BRAND NAME(S): KYPROLIS What should I tell my care team before I take this medication? They need to know if you have any of these conditions: Heart disease History of blood clots Irregular heartbeat Kidney disease Liver disease Lung or breathing disease An unusual or allergic reaction to carfilzomib, or other medications,  foods, dyes, or preservatives If you or your partner are pregnant or trying to get pregnant Breastfeeding How should I use this medication? This medication is injected into a vein. It is given by your care team in a hospital or clinic setting. Talk to your care team about the use of this medication in children. Special care may be needed. Overdosage: If you think you have taken too much of this medicine contact a poison control center or emergency room at once. NOTE: This medicine is only for you. Do not share this medicine with others. What if I miss a dose? Keep appointments for follow-up doses. It is important not to miss your dose. Call your care team if you are unable to keep an appointment. What may interact with this medication? Interactions are not expected. This list may not describe all possible interactions. Give your health care provider a list of all the medicines, herbs, non-prescription drugs, or dietary supplements you use. Also tell them if you smoke, drink alcohol, or use illegal drugs. Some items may interact with your medicine. What should I watch for while using this medication? Your condition will be monitored carefully while you are receiving this medication. You may need blood work while taking this medication. Check with your care team if you have severe diarrhea, nausea, and vomiting, or if you sweat a lot. The loss of too much body fluid may make it dangerous for you to take this medication. This medication may affect your coordination, reaction time, or judgment. Do not drive or operate machinery until you know how this medication affects you. Sit up or stand slowly to reduce the risk of dizzy or fainting spells. Drinking alcohol with this medication can increase the risk of these side effects. Talk to your care team if you may be pregnant. Serious birth defects can occur if you take this medication during pregnancy and for 6 months after the last dose. You will need a  negative pregnancy test before starting this medication. Contraception is recommended while taking this medication and for 6 months after the last dose. Your care team can help you find an option that works for you. If your partner can get pregnant, use a condom during sex while taking this medication and for 3 months after the last dose. Do not breastfeed while taking this medication and for 2 weeks after the last dose. This medication may cause infertility. Talk to your care team if you are concerned about your fertility. What side effects may I notice from receiving this medication? Side effects that you should report to your care team as soon as possible: Allergic reactions--skin rash, itching, hives, swelling of the face, lips, tongue, or throat Bleeding--bloody or black, tar-like stools, vomiting blood or brown material that looks like coffee grounds, red or dark brown urine, small red or purple spots on skin, unusual bruising or bleeding Blood clot--pain, swelling, or warmth in the leg, shortness of breath, chest pain Dizziness, loss of balance or coordination, confusion or trouble speaking Heart attack--pain or tightness in the chest, shoulders, arms, or jaw, nausea, shortness of breath, cold or clammy skin, feeling faint or lightheaded Heart failure--shortness  of breath, swelling of the ankles, feet, or hands, sudden weight gain, unusual weakness or fatigue Heart rhythm changes--fast or irregular heartbeat, dizziness, feeling faint or lightheaded, chest pain, trouble breathing Increase in blood pressure Infection--fever, chills, cough, sore throat, wounds that don't heal, pain or trouble when passing urine, general feeling of discomfort or being unwell Infusion reactions--chest pain, shortness of breath or trouble breathing, feeling faint or lightheaded Kidney injury--decrease in the amount of urine, swelling of the ankles, hands, or feet Liver injury--right upper belly pain, loss of  appetite, nausea, light-colored stool, dark yellow or brown urine, yellowing skin or eyes, unusual weakness or fatigue Lung injury--shortness of breath or trouble breathing, cough, spitting up blood, chest pain, fever Pulmonary hypertension--shortness of breath, chest pain, fast or irregular heartbeat, feeling faint or lightheaded, fatigue, swelling of the ankles or feet Stomach pain, bloody diarrhea, pale skin, unusual weakness or fatigue, decrease in the amount of urine, which may be signs of hemolytic uremic syndrome Sudden and severe headache, confusion, change in vision, seizures, which may be signs of posterior reversible encephalopathy syndrome (PRES) TTP--purple spots on the skin or inside the mouth, pale skin, yellowing skin or eyes, unusual weakness or fatigue, fever, fast or irregular heartbeat, confusion, change in vision, trouble speaking, trouble walking Tumor lysis syndrome (TLS)--nausea, vomiting, diarrhea, decrease in the amount of urine, dark urine, unusual weakness or fatigue, confusion, muscle pain or cramps, fast or irregular heartbeat, joint pain Side effects that usually do not require medical attention (report to your care team if they continue or are bothersome): Diarrhea Fatigue Nausea Trouble sleeping This list may not describe all possible side effects. Call your doctor for medical advice about side effects. You may report side effects to FDA at 1-800-FDA-1088. Where should I keep my medication? This medication is given in a hospital or clinic. It will not be stored at home. NOTE: This sheet is a summary. It may not cover all possible information. If you have questions about this medicine, talk to your doctor, pharmacist, or health care provider.  2023 Elsevier/Gold Standard (2022-02-01 00:00:00)

## 2022-05-24 NOTE — Progress Notes (Signed)
Hold cytoxan today and next week per Dr Benay Spice

## 2022-05-25 ENCOUNTER — Inpatient Hospital Stay: Payer: Medicare Other

## 2022-05-25 ENCOUNTER — Other Ambulatory Visit: Payer: Self-pay | Admitting: Nurse Practitioner

## 2022-05-29 ENCOUNTER — Encounter: Payer: Self-pay | Admitting: *Deleted

## 2022-05-29 ENCOUNTER — Inpatient Hospital Stay: Payer: Medicare Other

## 2022-05-29 ENCOUNTER — Other Ambulatory Visit (HOSPITAL_BASED_OUTPATIENT_CLINIC_OR_DEPARTMENT_OTHER): Payer: Self-pay

## 2022-05-29 NOTE — Progress Notes (Signed)
Call from [place], [place] with Dr. Armanda Magic reporting OK to proceed w/dexamethasone and carfizomib on 05/31/22 as scheduled. Cytoxan on hold as of week prior.

## 2022-05-30 ENCOUNTER — Other Ambulatory Visit (HOSPITAL_BASED_OUTPATIENT_CLINIC_OR_DEPARTMENT_OTHER): Payer: Self-pay

## 2022-05-30 MED ORDER — INFLUENZA VAC A&B SA ADJ QUAD 0.5 ML IM PRSY
PREFILLED_SYRINGE | INTRAMUSCULAR | 0 refills | Status: DC
  Filled 2022-05-30: qty 0.5, 1d supply, fill #0

## 2022-05-30 MED ORDER — AREXVY 120 MCG/0.5ML IM SUSR
INTRAMUSCULAR | 0 refills | Status: DC
  Filled 2022-05-30: qty 0.5, 1d supply, fill #0

## 2022-05-31 ENCOUNTER — Inpatient Hospital Stay: Payer: Medicare Other

## 2022-05-31 VITALS — BP 143/69 | HR 62 | Temp 97.9°F | Resp 18 | Ht 69.0 in | Wt 186.0 lb

## 2022-05-31 DIAGNOSIS — C9000 Multiple myeloma not having achieved remission: Secondary | ICD-10-CM

## 2022-05-31 DIAGNOSIS — Z9481 Bone marrow transplant status: Secondary | ICD-10-CM

## 2022-05-31 DIAGNOSIS — G620 Drug-induced polyneuropathy: Secondary | ICD-10-CM

## 2022-05-31 DIAGNOSIS — Z5112 Encounter for antineoplastic immunotherapy: Secondary | ICD-10-CM | POA: Diagnosis not present

## 2022-05-31 LAB — CBC WITH DIFFERENTIAL (CANCER CENTER ONLY)
Abs Immature Granulocytes: 0.01 10*3/uL (ref 0.00–0.07)
Basophils Absolute: 0 10*3/uL (ref 0.0–0.1)
Basophils Relative: 0 %
Eosinophils Absolute: 0.1 10*3/uL (ref 0.0–0.5)
Eosinophils Relative: 3 %
HCT: 26.5 % — ABNORMAL LOW (ref 39.0–52.0)
Hemoglobin: 9 g/dL — ABNORMAL LOW (ref 13.0–17.0)
Immature Granulocytes: 0 %
Lymphocytes Relative: 5 %
Lymphs Abs: 0.2 10*3/uL — ABNORMAL LOW (ref 0.7–4.0)
MCH: 37.5 pg — ABNORMAL HIGH (ref 26.0–34.0)
MCHC: 34 g/dL (ref 30.0–36.0)
MCV: 110.4 fL — ABNORMAL HIGH (ref 80.0–100.0)
Monocytes Absolute: 0.6 10*3/uL (ref 0.1–1.0)
Monocytes Relative: 15 %
Neutro Abs: 3.2 10*3/uL (ref 1.7–7.7)
Neutrophils Relative %: 77 %
Platelet Count: 67 10*3/uL — ABNORMAL LOW (ref 150–400)
RBC: 2.4 MIL/uL — ABNORMAL LOW (ref 4.22–5.81)
RDW: 14 % (ref 11.5–15.5)
WBC Count: 4.3 10*3/uL (ref 4.0–10.5)
nRBC: 0 % (ref 0.0–0.2)

## 2022-05-31 LAB — CMP (CANCER CENTER ONLY)
ALT: 14 U/L (ref 0–44)
AST: 18 U/L (ref 15–41)
Albumin: 4.2 g/dL (ref 3.5–5.0)
Alkaline Phosphatase: 55 U/L (ref 38–126)
Anion gap: 9 (ref 5–15)
BUN: 32 mg/dL — ABNORMAL HIGH (ref 8–23)
CO2: 24 mmol/L (ref 22–32)
Calcium: 10.2 mg/dL (ref 8.9–10.3)
Chloride: 102 mmol/L (ref 98–111)
Creatinine: 1.15 mg/dL (ref 0.61–1.24)
GFR, Estimated: >60 mL/min (ref >60)
Glucose, Bld: 83 mg/dL (ref 70–99)
Potassium: 4.2 mmol/L (ref 3.5–5.1)
Sodium: 135 mmol/L (ref 135–145)
Total Bilirubin: 0.5 mg/dL (ref 0.3–1.2)
Total Protein: 7.1 g/dL (ref 6.5–8.1)

## 2022-05-31 MED ORDER — SODIUM CHLORIDE 0.9 % IV SOLN: Freq: Once | INTRAVENOUS | Status: DC

## 2022-05-31 MED ORDER — SODIUM CHLORIDE 0.9 % IV SOLN: Freq: Once | INTRAVENOUS | Status: AC

## 2022-05-31 MED ORDER — DEXAMETHASONE 4 MG PO TABS
10.0000 mg | ORAL_TABLET | Freq: Once | ORAL | Status: AC
  Administered 2022-05-31: 10 mg via ORAL
  Filled 2022-05-31: qty 3

## 2022-05-31 MED ORDER — DEXTROSE 5 % IV SOLN
56.0000 mg/m2 | Freq: Once | INTRAVENOUS | Status: AC
  Administered 2022-05-31: 110 mg via INTRAVENOUS
  Filled 2022-05-31: qty 30

## 2022-05-31 NOTE — Progress Notes (Signed)
Per [name], NP ok to treat with platelets 67.

## 2022-05-31 NOTE — Patient Instructions (Signed)
[facility]   Discharge Instructions: Thank you for choosing [facility] to provide your oncology and hematology care.   If you have a lab appointment with the Cancer Center, please go directly to the Cancer Center and check in at the registration area.   Wear comfortable clothing and clothing appropriate for easy access to any Portacath or PICC line.   We strive to give you quality time with your provider. You may need to reschedule your appointment if you arrive late (15 or more minutes).  Arriving late affects you and other patients whose appointments are after yours.  Also, if you miss three or more appointments without notifying the office, you may be dismissed from the clinic at the provider's discretion.      For prescription refill requests, have your pharmacy contact our office and allow 72 hours for refills to be completed.    Today you received the following chemotherapy and/or immunotherapy agents Kyprolis.      To help prevent nausea and vomiting after your treatment, we encourage you to take your nausea medication as directed.  BELOW ARE SYMPTOMS THAT SHOULD BE REPORTED IMMEDIATELY: *FEVER GREATER THAN 100.4 F (38 C) OR HIGHER *CHILLS OR SWEATING *NAUSEA AND VOMITING THAT IS NOT CONTROLLED WITH YOUR NAUSEA MEDICATION *UNUSUAL SHORTNESS OF BREATH *UNUSUAL BRUISING OR BLEEDING *URINARY PROBLEMS (pain or burning when urinating, or frequent urination) *BOWEL PROBLEMS (unusual diarrhea, constipation, pain near the anus) TENDERNESS IN MOUTH AND THROAT WITH OR WITHOUT PRESENCE OF ULCERS (sore throat, sores in mouth, or a toothache) UNUSUAL RASH, SWELLING OR PAIN  UNUSUAL VAGINAL DISCHARGE OR ITCHING   Items with * indicate a potential emergency and should be followed up as soon as possible or go to the Emergency Department if any problems should occur.  Please show the CHEMOTHERAPY ALERT CARD or IMMUNOTHERAPY ALERT CARD at check-in to  the Emergency Department and triage nurse.  Should you have questions after your visit or need to cancel or reschedule your appointment, please contact [facility]  Dept: [phone]  and follow the prompts.  Office hours are 8:00 a.m. to 4:30 p.m. Monday - Friday. Please note that voicemails left after 4:00 p.m. may not be returned until the following business day.  We are closed weekends and major holidays. You have access to a nurse at all times for urgent questions. Please call the main number to the clinic Dept: [phone] and follow the prompts.   For any non-urgent questions, you may also contact your provider using MyChart. We now offer e-Visits for anyone 18 and older to request care online for non-urgent symptoms. For details visit [email or web address].   Also download the MyChart app! Go to the app store, search "MyChart", open the app, select [facility], and log in with your MyChart username and password.  Masks are optional in the cancer centers. If you would like for your care team to wear a mask while they are taking care of you, please let them know. You may have one support person who is at least 73 years old accompany you for your appointments.  Carfilzomib Injection What is this medication? CARFILZOMIB (kar FILZ oh mib) treats multiple myeloma, a type of bone marrow cancer. It works by blocking a protein that causes cancer cells to grow and multiply. This helps to slow or stop the spread of cancer cells. This medicine may be used for other purposes; ask your health care provider or pharmacist   if you have questions. COMMON BRAND NAME(S): KYPROLIS What should I tell my care team before I take this medication? They need to know if you have any of these conditions: Heart disease History of blood clots Irregular heartbeat Kidney disease Liver disease Lung or breathing disease An unusual or allergic reaction to carfilzomib, or other medications,  foods, dyes, or preservatives If you or your partner are pregnant or trying to get pregnant Breastfeeding How should I use this medication? This medication is injected into a vein. It is given by your care team in a hospital or clinic setting. Talk to your care team about the use of this medication in children. Special care may be needed. Overdosage: If you think you have taken too much of this medicine contact a poison control center or emergency room at once. NOTE: This medicine is only for you. Do not share this medicine with others. What if I miss a dose? Keep appointments for follow-up doses. It is important not to miss your dose. Call your care team if you are unable to keep an appointment. What may interact with this medication? Interactions are not expected. This list may not describe all possible interactions. Give your health care provider a list of all the medicines, herbs, non-prescription drugs, or dietary supplements you use. Also tell them if you smoke, drink alcohol, or use illegal drugs. Some items may interact with your medicine. What should I watch for while using this medication? Your condition will be monitored carefully while you are receiving this medication. You may need blood work while taking this medication. Check with your care team if you have severe diarrhea, nausea, and vomiting, or if you sweat a lot. The loss of too much body fluid may make it dangerous for you to take this medication. This medication may affect your coordination, reaction time, or judgment. Do not drive or operate machinery until you know how this medication affects you. Sit up or stand slowly to reduce the risk of dizzy or fainting spells. Drinking alcohol with this medication can increase the risk of these side effects. Talk to your care team if you may be pregnant. Serious birth defects can occur if you take this medication during pregnancy and for 6 months after the last dose. You will need a  negative pregnancy test before starting this medication. Contraception is recommended while taking this medication and for 6 months after the last dose. Your care team can help you find an option that works for you. If your partner can get pregnant, use a condom during sex while taking this medication and for 3 months after the last dose. Do not breastfeed while taking this medication and for 2 weeks after the last dose. This medication may cause infertility. Talk to your care team if you are concerned about your fertility. What side effects may I notice from receiving this medication? Side effects that you should report to your care team as soon as possible: Allergic reactions--skin rash, itching, hives, swelling of the face, lips, tongue, or throat Bleeding--bloody or black, tar-like stools, vomiting blood or brown material that looks like coffee grounds, red or dark brown urine, small red or purple spots on skin, unusual bruising or bleeding Blood clot--pain, swelling, or warmth in the leg, shortness of breath, chest pain Dizziness, loss of balance or coordination, confusion or trouble speaking Heart attack--pain or tightness in the chest, shoulders, arms, or jaw, nausea, shortness of breath, cold or clammy skin, feeling faint or lightheaded Heart failure--shortness  of breath, swelling of the ankles, feet, or hands, sudden weight gain, unusual weakness or fatigue Heart rhythm changes--fast or irregular heartbeat, dizziness, feeling faint or lightheaded, chest pain, trouble breathing Increase in blood pressure Infection--fever, chills, cough, sore throat, wounds that don't heal, pain or trouble when passing urine, general feeling of discomfort or being unwell Infusion reactions--chest pain, shortness of breath or trouble breathing, feeling faint or lightheaded Kidney injury--decrease in the amount of urine, swelling of the ankles, hands, or feet Liver injury--right upper belly pain, loss of  appetite, nausea, light-colored stool, dark yellow or brown urine, yellowing skin or eyes, unusual weakness or fatigue Lung injury--shortness of breath or trouble breathing, cough, spitting up blood, chest pain, fever Pulmonary hypertension--shortness of breath, chest pain, fast or irregular heartbeat, feeling faint or lightheaded, fatigue, swelling of the ankles or feet Stomach pain, bloody diarrhea, pale skin, unusual weakness or fatigue, decrease in the amount of urine, which may be signs of hemolytic uremic syndrome Sudden and severe headache, confusion, change in vision, seizures, which may be signs of posterior reversible encephalopathy syndrome (PRES) TTP--purple spots on the skin or inside the mouth, pale skin, yellowing skin or eyes, unusual weakness or fatigue, fever, fast or irregular heartbeat, confusion, change in vision, trouble speaking, trouble walking Tumor lysis syndrome (TLS)--nausea, vomiting, diarrhea, decrease in the amount of urine, dark urine, unusual weakness or fatigue, confusion, muscle pain or cramps, fast or irregular heartbeat, joint pain Side effects that usually do not require medical attention (report to your care team if they continue or are bothersome): Diarrhea Fatigue Nausea Trouble sleeping This list may not describe all possible side effects. Call your doctor for medical advice about side effects. You may report side effects to FDA at 1-800-FDA-1088. Where should I keep my medication? This medication is given in a hospital or clinic. It will not be stored at home. NOTE: This sheet is a summary. It may not cover all possible information. If you have questions about this medicine, talk to your doctor, pharmacist, or health care provider.  2023 Elsevier/Gold Standard (2022-02-01 00:00:00)  

## 2022-06-08 ENCOUNTER — Encounter: Payer: Self-pay | Admitting: Internal Medicine

## 2022-06-08 ENCOUNTER — Ambulatory Visit (INDEPENDENT_AMBULATORY_CARE_PROVIDER_SITE_OTHER): Payer: Medicare Other | Admitting: Internal Medicine

## 2022-06-08 DIAGNOSIS — G620 Drug-induced polyneuropathy: Secondary | ICD-10-CM

## 2022-06-08 DIAGNOSIS — C9000 Multiple myeloma not having achieved remission: Secondary | ICD-10-CM | POA: Diagnosis not present

## 2022-06-08 DIAGNOSIS — T451X5A Adverse effect of antineoplastic and immunosuppressive drugs, initial encounter: Secondary | ICD-10-CM

## 2022-06-08 DIAGNOSIS — F329 Major depressive disorder, single episode, unspecified: Secondary | ICD-10-CM

## 2022-06-08 NOTE — Assessment & Plan Note (Signed)
Toes sx's are bothersoms after treatments

## 2022-06-08 NOTE — Progress Notes (Signed)
Subjective:  Patient ID: Andre [name], male    DOB: 30-Mar-1951  Age: 71 y.o. MRN: [id number]  CC: Follow-up   HPI Andre [name] presents for MM - Andre [name] is going to [place] for [place] T cell therapy by  Dr Terrilyn Saver F/u on depression, neuropathy   Outpatient Medications Prior to Visit  Medication Sig Dispense Refill   calcium-vitamin D (OSCAL WITH D) 500-200 MG-UNIT tablet Take 1 tablet by mouth daily.     cetirizine (ZYRTEC) 10 MG tablet Take 10 mg by mouth daily.     Cholecalciferol (VITAMIN D3) 2000 units capsule Take 1 capsule (2,000 Units total) by mouth daily. 100 capsule 3   cyclophosphamide (CYTOXAN) 50 MG capsule Take by mouth.     dexamethasone (DECADRON) 4 MG tablet Take 2.5 tablets (10 mg total) by mouth as directed. Day of treatment and Day after treatment as of 5/22 5 tablet 3   diphenhydrAMINE (BENADRYL) 25 mg capsule Take 2 capsules (50 mg total) by mouth every 6 (six) hours as needed. Takes 2 benadryl at bedtime for itching 30 capsule 0   docusate sodium (COLACE) 100 MG capsule Take 100 mg by mouth daily as needed for mild constipation or moderate constipation.     escitalopram (LEXAPRO) 10 MG tablet Take 1 tablet (10 mg total) by mouth daily. 90 tablet 1   HYDROCORTISONE ACE, RECTAL, 30 MG SUPP 1 pr bid x 7 d prn 14 suppository 3   hydrocortisone valerate cream (WESTCORT) 0.2 % Apply 1 application topically 2 (two) times daily. 60 g 2   influenza vaccine adjuvanted (FLUAD) 0.5 ML injection Inject into the muscle. 0.5 mL 0   levothyroxine (SYNTHROID) 150 MCG tablet TAKE 1 TABLET BY MOUTH EVERY MORNING ON AN EMPTY STOMACH 90 tablet 3   LORazepam (ATIVAN) 0.5 MG tablet Take 1 tablet (0.5 mg total) by mouth at bedtime as needed for anxiety. 60 tablet 1   ondansetron (ZOFRAN) 8 MG tablet Take 1 tablet (8 mg total) by mouth 1 hour prior to oral cytoxan and every 8 hours as needed for nausea 30 tablet 1   pantoprazole (PROTONIX) 40 MG tablet TAKE 1 TABLET  BY MOUTH TWICE DAILY BEFORE MEALS 180 tablet 3   triamcinolone ointment (KENALOG) 0.1 % Apply 1 application topically 2 (two) times daily. 80 g 2   valACYclovir (VALTREX) 1000 MG tablet Take 1 tablet (1,000 mg total) by mouth daily. 90 tablet 3   zolendronic acid (ZOMETA) 4 MG/5ML injection Inject 4 mg into the vein every 3 (three) months.     Calcium Carb-Cholecalciferol (OYSTER SHELL CALCIUM/VITAMIN D) 500-5 MG-MCG PACK Take by mouth.     RSV vaccine recomb adjuvanted (AREXVY) 120 MCG/0.5ML injection Inject into the muscle. (Patient not taking: Reported on 06/08/2022) 0.5 mL 0   No facility-administered medications prior to visit.    ROS: Review of Systems  Constitutional:  Positive for fatigue. Negative for appetite change and unexpected weight change.  HENT:  Negative for congestion, nosebleeds, sneezing, sore throat and trouble swallowing.   Eyes:  Negative for itching and visual disturbance.  Respiratory:  Negative for cough.   Cardiovascular:  Negative for chest pain, palpitations and leg swelling.  Gastrointestinal:  Negative for abdominal distention, blood in stool, diarrhea and nausea.  Genitourinary:  Negative for frequency and hematuria.  Musculoskeletal:  Negative for back pain, gait problem, joint swelling and neck pain.  Skin:  Negative for rash.  Neurological:  Positive for numbness. Negative  for dizziness, tremors, speech difficulty and weakness.  Psychiatric/Behavioral:  Positive for decreased concentration and dysphoric mood. Negative for agitation, sleep disturbance and suicidal ideas. The patient is nervous/anxious.     Objective:  BP 130/72 (BP Location: Left Arm)   Pulse 86   Temp 98 F (36.7 C) (Oral)   Ht 5' 9" (1.753 m)   Wt 186 lb 6.4 oz (84.6 kg)   SpO2 98%   BMI 27.53 kg/m   BP Readings from Last 3 Encounters:  06/08/22 130/72  05/31/22 (!) 143/69  05/24/22 (!) 142/62    Wt Readings from Last 3 Encounters:  06/08/22 186 lb 6.4 oz (84.6 kg)   05/31/22 186 lb (84.4 kg)  05/24/22 185 lb 9.6 oz (84.2 kg)    Physical Exam Constitutional:      General: He is not in acute distress.    Appearance: He is well-developed.     Comments: NAD  Eyes:     Conjunctiva/sclera: Conjunctivae normal.     Pupils: Pupils are equal, round, and reactive to light.  Neck:     Thyroid: No thyromegaly.     Vascular: No JVD.  Cardiovascular:     Rate and Rhythm: Normal rate and regular rhythm.     Heart sounds: Normal heart sounds. No murmur heard.    No friction rub. No gallop.  Pulmonary:     Effort: Pulmonary effort is normal. No respiratory distress.     Breath sounds: Normal breath sounds. No wheezing or rales.  Chest:     Chest wall: No tenderness.  Abdominal:     General: Bowel sounds are normal. There is no distension.     Palpations: Abdomen is soft. There is no mass.     Tenderness: There is no abdominal tenderness. There is no guarding or rebound.  Musculoskeletal:        General: No tenderness. Normal range of motion.     Cervical back: Normal range of motion.  Lymphadenopathy:     Cervical: No cervical adenopathy.  Skin:    General: Skin is warm and dry.     Findings: No rash.  Neurological:     Mental Status: He is alert.     Cranial Nerves: No cranial nerve deficit.     Motor: No abnormal muscle tone.     Coordination: Coordination normal.     Gait: Gait normal.     Deep Tendon Reflexes: Reflexes are normal and symmetric.  Psychiatric:        Behavior: Behavior normal.        Thought Content: Thought content normal.        Judgment: Judgment normal.     Lab Results  Component Value Date   WBC 4.3 05/31/2022   HGB 9.0 (L) 05/31/2022   HCT 26.5 (L) 05/31/2022   PLT 67 (L) 05/31/2022   GLUCOSE 83 05/31/2022   CHOL 164 09/06/2017   TRIG 194.0 (H) 09/06/2017   HDL 38.60 (L) 09/06/2017   LDLDIRECT 155.8 11/13/2012   LDLCALC 86 09/06/2017   ALT 14 05/31/2022   AST 18 05/31/2022   NA 135 05/31/2022   K 4.2  05/31/2022   CL 102 05/31/2022   CREATININE 1.15 05/31/2022   BUN 32 (H) 05/31/2022   CO2 24 05/31/2022   TSH 0.02 (L) 08/04/2021   PSA 1.76 09/06/2017   INR 1.07 02/27/2016   HGBA1C 5.4 03/27/2012    CT HEAD WO CONTRAST (5MM)  Result Date: 08/10/2021 CLINICAL DATA:  Memory  loss.  History of multiple myeloma. EXAM: CT HEAD WITHOUT CONTRAST TECHNIQUE: Contiguous axial images were obtained from the base of the skull through the vertex without intravenous contrast. COMPARISON:  06/15/2020 FINDINGS: Brain: No evidence of acute infarction, hemorrhage, hydrocephalus, extra-axial collection or mass lesion/mass effect. Small remote left cerebellar infarction. Chronic small vessel ischemia in the hemispheric white matter, mild for age. Generalized cortical volume loss Vascular: No hyperdense vessel or unexpected calcification. Skull: Diffuse heterogeneity of the skull which is likely related to myeloma, and was also seen on prior. No discrete lesion or inner table erosion. Sinuses/Orbits: Negative IMPRESSION: Stable from 2021.  No specific or reversible cause for symptoms. Electronically Signed   By: Jorje Guild M.D.   On: 08/10/2021 11:43    Assessment & Plan:   Problem List Items Addressed This Visit     Multiple myeloma not having achieved remission [facility])    Andre [name] is going to [place] for [place] T cell therapy by  Dr Terrilyn Saver      Neuropathy due to chemotherapeutic drug ([place])    Toes sx's are bothersoms after treatments      Reactive depression (situational)    Cont w/Lexapro         No orders of the defined types were placed in this encounter.     Follow-up: No follow-ups on file.  [name], MD

## 2022-06-08 NOTE — Assessment & Plan Note (Signed)
Andre [name] is going to [place] for [name] T cell therapy by  Dr Terrilyn Saver

## 2022-06-08 NOTE — Assessment & Plan Note (Signed)
Cont w/Lexapro

## 2022-06-14 ENCOUNTER — Other Ambulatory Visit: Payer: Self-pay | Admitting: *Deleted

## 2022-06-14 DIAGNOSIS — C9000 Multiple myeloma not having achieved remission: Secondary | ICD-10-CM

## 2022-06-18 ENCOUNTER — Other Ambulatory Visit: Payer: Self-pay | Admitting: Oncology

## 2022-06-18 DIAGNOSIS — C9000 Multiple myeloma not having achieved remission: Secondary | ICD-10-CM

## 2022-06-18 DIAGNOSIS — Z9481 Bone marrow transplant status: Secondary | ICD-10-CM

## 2022-06-18 DIAGNOSIS — G620 Drug-induced polyneuropathy: Secondary | ICD-10-CM

## 2022-06-19 ENCOUNTER — Inpatient Hospital Stay: Payer: Medicare Other

## 2022-06-19 ENCOUNTER — Inpatient Hospital Stay: Payer: Medicare Other | Attending: Oncology

## 2022-06-19 ENCOUNTER — Other Ambulatory Visit: Payer: Self-pay | Admitting: Oncology

## 2022-06-19 ENCOUNTER — Inpatient Hospital Stay (HOSPITAL_BASED_OUTPATIENT_CLINIC_OR_DEPARTMENT_OTHER): Payer: Medicare Other | Admitting: Oncology

## 2022-06-19 VITALS — BP 117/59 | HR 85 | Temp 97.9°F | Resp 18 | Ht 69.0 in | Wt 185.0 lb

## 2022-06-19 DIAGNOSIS — Z5111 Encounter for antineoplastic chemotherapy: Secondary | ICD-10-CM | POA: Diagnosis not present

## 2022-06-19 DIAGNOSIS — Z9481 Bone marrow transplant status: Secondary | ICD-10-CM

## 2022-06-19 DIAGNOSIS — R509 Fever, unspecified: Secondary | ICD-10-CM | POA: Diagnosis not present

## 2022-06-19 DIAGNOSIS — C9000 Multiple myeloma not having achieved remission: Secondary | ICD-10-CM

## 2022-06-19 DIAGNOSIS — Z79899 Other long term (current) drug therapy: Secondary | ICD-10-CM | POA: Insufficient documentation

## 2022-06-19 DIAGNOSIS — Z5112 Encounter for antineoplastic immunotherapy: Secondary | ICD-10-CM | POA: Insufficient documentation

## 2022-06-19 DIAGNOSIS — G620 Drug-induced polyneuropathy: Secondary | ICD-10-CM

## 2022-06-19 DIAGNOSIS — T451X5A Adverse effect of antineoplastic and immunosuppressive drugs, initial encounter: Secondary | ICD-10-CM

## 2022-06-19 LAB — CMP (CANCER CENTER ONLY)
ALT: 19 U/L (ref 0–44)
AST: 24 U/L (ref 15–41)
Albumin: 4.1 g/dL (ref 3.5–5.0)
Alkaline Phosphatase: 43 U/L (ref 38–126)
Anion gap: 10 (ref 5–15)
BUN: 29 mg/dL — ABNORMAL HIGH (ref 8–23)
CO2: 23 mmol/L (ref 22–32)
Calcium: 9.9 mg/dL (ref 8.9–10.3)
Chloride: 103 mmol/L (ref 98–111)
Creatinine: 1.32 mg/dL — ABNORMAL HIGH (ref 0.61–1.24)
GFR, Estimated: 57 mL/min — ABNORMAL LOW (ref >60)
Glucose, Bld: 87 mg/dL (ref 70–99)
Potassium: 4.3 mmol/L (ref 3.5–5.1)
Sodium: 136 mmol/L (ref 135–145)
Total Bilirubin: 0.5 mg/dL (ref 0.3–1.2)
Total Protein: 7.2 g/dL (ref 6.5–8.1)

## 2022-06-19 LAB — CBC WITH DIFFERENTIAL (CANCER CENTER ONLY)
Abs Immature Granulocytes: 0.01 10*3/uL (ref 0.00–0.07)
Basophils Absolute: 0 10*3/uL (ref 0.0–0.1)
Basophils Relative: 0 %
Eosinophils Absolute: 0.1 10*3/uL (ref 0.0–0.5)
Eosinophils Relative: 2 %
HCT: 25.9 % — ABNORMAL LOW (ref 39.0–52.0)
Hemoglobin: 8.6 g/dL — ABNORMAL LOW (ref 13.0–17.0)
Immature Granulocytes: 0 %
Lymphocytes Relative: 8 %
Lymphs Abs: 0.2 10*3/uL — ABNORMAL LOW (ref 0.7–4.0)
MCH: 36.1 pg — ABNORMAL HIGH (ref 26.0–34.0)
MCHC: 33.2 g/dL (ref 30.0–36.0)
MCV: 108.8 fL — ABNORMAL HIGH (ref 80.0–100.0)
Monocytes Absolute: 0.8 10*3/uL (ref 0.1–1.0)
Monocytes Relative: 25 %
Neutro Abs: 2 10*3/uL (ref 1.7–7.7)
Neutrophils Relative %: 65 %
Platelet Count: 83 10*3/uL — ABNORMAL LOW (ref 150–400)
RBC: 2.38 MIL/uL — ABNORMAL LOW (ref 4.22–5.81)
RDW: 14.6 % (ref 11.5–15.5)
WBC Count: 3.1 10*3/uL — ABNORMAL LOW (ref 4.0–10.5)
nRBC: 0 % (ref 0.0–0.2)

## 2022-06-19 NOTE — Progress Notes (Signed)
[facility] OFFICE PROGRESS NOTE   Diagnosis: Multiple myeloma  INTERVAL HISTORY:   Andre [name] was last treated with carfilzomib on 05/31/2022.  He underwent Vas-Cath insertion at the left neck and and [facility].  He reports chills and feeling that he had a fever yesterday.  The symptoms have resolved.  He feels "weak "today.  No dyspnea, cough, or sore throat.  He fell today.  He has experienced multiple falls in the past.  He injured the left lower anterolateral chest wall.  Ms.Colquhoun reports the CAR-T treatment has been delayed until early January.  Objective:  Vital signs in last 24 hours:  Blood pressure (!) 117/59, pulse 85, temperature 97.9 F (36.6 C), temperature source Oral, resp. rate 18, height 5' 9"  (1.753 m), weight 185 lb (83.9 kg), SpO2 99 %.    HEENT: No thrush or ulcers Resp: Lungs clear bilaterally Cardio: Regular rate and rhythm GI: No hepatosplenomegaly Vascular: Trace ankle edema bilaterally Neuro: Alert and oriented, ambulates to the exam table without difficulty Skin: Left neck catheter site without evidence of infection  Lab Results:  Lab Results  Component Value Date   WBC 3.1 (L) 06/19/2022   HGB 8.6 (L) 06/19/2022   HCT 25.9 (L) 06/19/2022   MCV 108.8 (H) 06/19/2022   PLT 83 (L) 06/19/2022   NEUTROABS 2.0 06/19/2022    CMP  Lab Results  Component Value Date   NA 135 05/31/2022   K 4.2 05/31/2022   CL 102 05/31/2022   CO2 24 05/31/2022   GLUCOSE 83 05/31/2022   BUN 32 (H) 05/31/2022   CREATININE 1.15 05/31/2022   CALCIUM 10.2 05/31/2022   PROT 7.1 05/31/2022   ALBUMIN 4.2 05/31/2022   AST 18 05/31/2022   ALT 14 05/31/2022   ALKPHOS 55 05/31/2022   BILITOT 0.5 05/31/2022   GFRNONAA >60 05/31/2022   GFRAA >60 06/15/2020     Medications: I have reviewed the patient's current medications.   Assessment/Plan: Multiple myeloma- IgG lambda  bortezomib (subcutaneously),  lenalidomide, and dexamethasone, with repeat bone marrow biopsy May of 2012 showing 10% plasmacytosis   (2) high-dose chemotherapy with BCNU and melphalan at [facility], followed by stem cell rescue July of 2012   (3) on zoledronic acid started December of 2012, initially monthly, currently given every 3 months, most recent dose  12/07/2015   (4) low-dose lenalidomide resumed April 2013, interrupted several times.  Resumed again on 02/19/2013, ata dose of 5 mg daily, 21 days on and 7 days off, later further reduced to 7 days on, 7 days off   (5) CNS symptoms and abnormal brain MRI extensively evaluated by neurology with no definitive diagnosis established   (6) rising M spike noted June 2014 but did not meet criteria for carfilzomib study   (7) on lenalidomide 25 mg daily, 14 days on, 7 days off, starting 04/18/2013, interrupted December 2014 because of rash;             (a) resumed January 2015 at 10 mg/ day at 21 days on/ 7 days off             (Ira) starting 08/18/2014 decreased to 10 mg/ day 14 days on, 7 days off because of cytopenias             (c) as of February 2016 was on 5 mg daily 7 days on 7 days off             (d)  lenalidomide discontinued December 2016 with evidence of disease progression   (8) transient global amnesia 05/29/2015, resolved without intervention   (9) starting PVD 10/25/2015 w ASA 325 thromboprophylaxis, valacyclovir prophylaxis, last dose 12/17/2015             (a) pomalidomide 4 mg/d days 1-14             (Jhonathan) bortezomib sQ days 2,5,9,12 of each 21 day cycle             (c) dexamethasome 20 mg two days a week             (d) dexamethasone bortezomib and pomalidomide discontinued late December 2018 with poor tolerance   (10) metapneumovirus pneumonia April 2017             (a) completing course of steroids and week of bactrim mid April 2017   (11) status post second autologous transplant at [facility] 02/04/2016(preparatory regimen melphalan 200 mg/m)             (a)  received twelve-month vaccinations 03/14/2017 (DPT, Haemophilus, Pneumovax 13, polio)             ([place]) 14 months injections 05/04/2016 include DTaP, Hib conjugate, HepB energex Cora 20 mcg/ml, Prevnar 13             (c) 24 month vaccines due at [facility] June 2019   (12) maintenance therapy started November 2017, consisting of             (a) bortezomib 1.3 mg/M2 every 14 days, first dose 07/27/2016             (Paul) pomalidomide 1 mg days 1-21 Q28 days, started 07/19/2016             (c) zolendronate monthly started 07/27/2016 (previously Q 3 months) however patient unable to tolerate, and changed back to q3 months in April, 2018             (d) Bortezomib changed to monthly as of June 2018 because of tolerance issues, however discontinued after 09/14/2017 dose because of a rise in his M spike             (e) pomalidomide held after 10/18/2017 in preparation for possible study at [place] (venetoclax)--never resumed             (f) with numbers actually improved off treatment, resumed every 2-week bortezomib 12/25/2017             (g) changed to every 3-week bortezomib as of 03/17/2019             (h) briefly on weekly treatments times 22 October 2020 due to increase in M spike             (I) maintenance therapy discontinued with evidence of progression   (13) bortezomib/daratumumab/dexamethasone started 12/20/2020.             (a) Decadron dose dropped to 10 mg day of and day following treatment starting May 2022             (Elijha) day 8 bortezomib omitted beginning with the June cycle per patient preference             (C) treatment changed to monthly daratumumab/Velcade/Decadron beginning 06/27/2021-patient decision (14) weekly Velcade/Cytoxan beginning 11/14/2021 (15) changed to Cytoxan/carfilzomib/Decadron 12/19/2021-treatment placed on hold 05/15/2022, carfilzomib 05/24/2022 and 05/31/2022 16.  Leukocyte a pheresis procedure at [facility] 06/15/2022 17.  Treatment resumed with Cytoxan/carfilzomib/Decadron  06/28/2022  Disposition: Andre [name] appears unchanged.  He had was of a fever yesterday and feels weak.  He has no specific symptoms to suggest a source of infection.  He would like to hold treatment today.  He completed the leukocyte pheresis procedure last week.  The plan is to resume treatment with carfilzomib/Cytoxan/Decadron while he waits on CAR-T therapy.  The serum free lambda light chains were slightly lower on 05/15/2022.  He will resume weekly systemic therapy on 06/28/2022.  He is scheduled to follow-up in [place] early next week.  He will obtain influenza and COVID-19 vaccines.  Andre Coder, MD  06/19/2022  10:06 AM

## 2022-06-19 NOTE — Progress Notes (Signed)
Patient seen by Dr. Benay Spice today  Vitals are within treatment parameters.  Labs reviewed by Dr. Benay Spice and are not all within treatment parameters. Platelets 83 K/uL.   Per physician team, patient will not be receiving treatment today. Pt with follow-up as scheduled.

## 2022-06-20 ENCOUNTER — Other Ambulatory Visit: Payer: Self-pay

## 2022-06-21 ENCOUNTER — Other Ambulatory Visit: Payer: Self-pay

## 2022-06-22 ENCOUNTER — Other Ambulatory Visit: Payer: Self-pay | Admitting: Oncology

## 2022-06-22 ENCOUNTER — Other Ambulatory Visit: Payer: Self-pay

## 2022-06-27 ENCOUNTER — Other Ambulatory Visit (HOSPITAL_BASED_OUTPATIENT_CLINIC_OR_DEPARTMENT_OTHER): Payer: Self-pay

## 2022-06-27 ENCOUNTER — Other Ambulatory Visit: Payer: Self-pay | Admitting: Internal Medicine

## 2022-06-27 MED ORDER — ESCITALOPRAM OXALATE 10 MG PO TABS
10.0000 mg | ORAL_TABLET | Freq: Every day | ORAL | 1 refills | Status: DC
  Filled 2022-06-27 – 2022-08-14 (×2): qty 90, 90d supply, fill #0
  Filled 2022-09-21: qty 30, 30d supply, fill #1
  Filled 2022-09-22: qty 90, 90d supply, fill #1

## 2022-06-28 ENCOUNTER — Inpatient Hospital Stay: Payer: Medicare Other

## 2022-06-28 VITALS — BP 134/85 | HR 65 | Temp 97.8°F | Resp 18 | Ht 69.0 in | Wt 183.0 lb

## 2022-06-28 DIAGNOSIS — C9000 Multiple myeloma not having achieved remission: Secondary | ICD-10-CM

## 2022-06-28 DIAGNOSIS — Z9481 Bone marrow transplant status: Secondary | ICD-10-CM

## 2022-06-28 DIAGNOSIS — Z5112 Encounter for antineoplastic immunotherapy: Secondary | ICD-10-CM | POA: Diagnosis not present

## 2022-06-28 DIAGNOSIS — T451X5A Adverse effect of antineoplastic and immunosuppressive drugs, initial encounter: Secondary | ICD-10-CM

## 2022-06-28 LAB — CBC WITH DIFFERENTIAL (CANCER CENTER ONLY)
Abs Immature Granulocytes: 0.02 10*3/uL (ref 0.00–0.07)
Basophils Absolute: 0 10*3/uL (ref 0.0–0.1)
Basophils Relative: 1 %
Eosinophils Absolute: 0.2 10*3/uL (ref 0.0–0.5)
Eosinophils Relative: 5 %
HCT: 28 % — ABNORMAL LOW (ref 39.0–52.0)
Hemoglobin: 9.1 g/dL — ABNORMAL LOW (ref 13.0–17.0)
Immature Granulocytes: 1 %
Lymphocytes Relative: 10 %
Lymphs Abs: 0.3 10*3/uL — ABNORMAL LOW (ref 0.7–4.0)
MCH: 35.1 pg — ABNORMAL HIGH (ref 26.0–34.0)
MCHC: 32.5 g/dL (ref 30.0–36.0)
MCV: 108.1 fL — ABNORMAL HIGH (ref 80.0–100.0)
Monocytes Absolute: 0.7 10*3/uL (ref 0.1–1.0)
Monocytes Relative: 19 %
Neutro Abs: 2.3 10*3/uL (ref 1.7–7.7)
Neutrophils Relative %: 64 %
Platelet Count: 113 10*3/uL — ABNORMAL LOW (ref 150–400)
RBC: 2.59 MIL/uL — ABNORMAL LOW (ref 4.22–5.81)
RDW: 14.4 % (ref 11.5–15.5)
WBC Count: 3.5 10*3/uL — ABNORMAL LOW (ref 4.0–10.5)
nRBC: 0 % (ref 0.0–0.2)

## 2022-06-28 LAB — CMP (CANCER CENTER ONLY)
ALT: 14 U/L (ref 0–44)
AST: 18 U/L (ref 15–41)
Albumin: 4.4 g/dL (ref 3.5–5.0)
Alkaline Phosphatase: 49 U/L (ref 38–126)
Anion gap: 7 (ref 5–15)
BUN: 28 mg/dL — ABNORMAL HIGH (ref 8–23)
CO2: 25 mmol/L (ref 22–32)
Calcium: 10.4 mg/dL — ABNORMAL HIGH (ref 8.9–10.3)
Chloride: 103 mmol/L (ref 98–111)
Creatinine: 1.26 mg/dL — ABNORMAL HIGH (ref 0.61–1.24)
GFR, Estimated: >60 mL/min (ref >60)
Glucose, Bld: 97 mg/dL (ref 70–99)
Potassium: 4.3 mmol/L (ref 3.5–5.1)
Sodium: 135 mmol/L (ref 135–145)
Total Bilirubin: 0.5 mg/dL (ref 0.3–1.2)
Total Protein: 8.2 g/dL — ABNORMAL HIGH (ref 6.5–8.1)

## 2022-06-28 MED ORDER — DEXAMETHASONE 4 MG PO TABS
10.0000 mg | ORAL_TABLET | Freq: Once | ORAL | Status: AC
  Administered 2022-06-28: 10 mg via ORAL
  Filled 2022-06-28: qty 3

## 2022-06-28 MED ORDER — DEXTROSE 5 % IV SOLN
56.0000 mg/m2 | Freq: Once | INTRAVENOUS | Status: AC
  Administered 2022-06-28: 110 mg via INTRAVENOUS
  Filled 2022-06-28: qty 10

## 2022-06-28 MED ORDER — SODIUM CHLORIDE 0.9 % IV SOLN: Freq: Once | INTRAVENOUS | Status: AC

## 2022-06-28 MED ORDER — SODIUM CHLORIDE 0.9 % IV SOLN
250.0000 mg/m2 | Freq: Once | INTRAVENOUS | Status: AC
  Administered 2022-06-28: 500 mg via INTRAVENOUS
  Filled 2022-06-28: qty 25

## 2022-06-28 NOTE — Patient Instructions (Signed)
[facility]   Discharge Instructions: Thank you for choosing [facility] to provide your oncology and hematology care.   If you have a lab appointment with the [facility], please go directly to the [facility] and check in at the registration area.   Wear comfortable clothing and clothing appropriate for easy access to any Portacath or PICC line.   We strive to give you quality time with your provider. You may need to reschedule your appointment if you arrive late (15 or more minutes).  Arriving late affects you and other patients whose appointments are after yours.  Also, if you miss three or more appointments without notifying the office, you may be dismissed from the clinic at the provider's discretion.      For prescription refill requests, have your pharmacy contact our office and allow 72 hours for refills to be completed.    Today you received the following chemotherapy and/or immunotherapy agents Kyprolis, cytoxan      To help prevent nausea and vomiting after your treatment, we encourage you to take your nausea medication as directed.  BELOW ARE SYMPTOMS THAT SHOULD BE REPORTED IMMEDIATELY: *FEVER GREATER THAN 100.4 F (38 C) OR HIGHER *CHILLS OR SWEATING *NAUSEA AND VOMITING THAT IS NOT CONTROLLED WITH YOUR NAUSEA MEDICATION *UNUSUAL SHORTNESS OF BREATH *UNUSUAL BRUISING OR BLEEDING *URINARY PROBLEMS (pain or burning when urinating, or frequent urination) *BOWEL PROBLEMS (unusual diarrhea, constipation, pain near the anus) TENDERNESS IN MOUTH AND THROAT WITH OR WITHOUT PRESENCE OF ULCERS (sore throat, sores in mouth, or a toothache) UNUSUAL RASH, SWELLING OR PAIN  UNUSUAL VAGINAL DISCHARGE OR ITCHING   Items with * indicate a potential emergency and should be followed up as soon as possible or go to the Emergency Department if any problems should occur.  Please show the CHEMOTHERAPY ALERT CARD or IMMUNOTHERAPY ALERT CARD at  check-in to the Emergency Department and triage nurse.  Should you have questions after your visit or need to cancel or reschedule your appointment, please contact [facility]  Dept: [phone]  and follow the prompts.  Office hours are 8:00 a.m. to 4:30 p.m. Monday - Friday. Please note that voicemails left after 4:00 p.m. may not be returned until the following business day.  We are closed weekends and major holidays. You have access to a nurse at all times for urgent questions. Please call the main number to the clinic Dept: [phone] and follow the prompts.   For any non-urgent questions, you may also contact your provider using MyChart. We now offer e-Visits for anyone 68 and older to request care online for non-urgent symptoms. For details visit [email or web address].   Also download the MyChart app! Go to the app store, search "MyChart", open the app, select [facility], and log in with your MyChart username and password.  Masks are optional in the cancer centers. If you would like for your care team to wear a mask while they are taking care of you, please let them know. You may have one support person who is at least 73 years old accompany you for your appointments.  Carfilzomib Injection What is this medication? CARFILZOMIB (kar FILZ oh mib) treats multiple myeloma, a type of bone marrow cancer. It works by blocking a protein that causes cancer cells to grow and multiply. This helps to slow or stop the spread of cancer cells. This medicine may be used for other purposes; ask your health care provider or  pharmacist if you have questions. COMMON BRAND NAME(S): KYPROLIS What should I tell my care team before I take this medication? They need to know if you have any of these conditions: Heart disease History of blood clots Irregular heartbeat Kidney disease Liver disease Lung or breathing disease An unusual or allergic reaction to carfilzomib, or other  medications, foods, dyes, or preservatives If you or your partner are pregnant or trying to get pregnant Breastfeeding How should I use this medication? This medication is injected into a vein. It is given by your care team in a hospital or clinic setting. Talk to your care team about the use of this medication in children. Special care may be needed. Overdosage: If you think you have taken too much of this medicine contact a poison control center or emergency room at once. NOTE: This medicine is only for you. Do not share this medicine with others. What if I miss a dose? Keep appointments for follow-up doses. It is important not to miss your dose. Call your care team if you are unable to keep an appointment. What may interact with this medication? Interactions are not expected. This list may not describe all possible interactions. Give your health care provider a list of all the medicines, herbs, non-prescription drugs, or dietary supplements you use. Also tell them if you smoke, drink alcohol, or use illegal drugs. Some items may interact with your medicine. What should I watch for while using this medication? Your condition will be monitored carefully while you are receiving this medication. You may need blood work while taking this medication. Check with your care team if you have severe diarrhea, nausea, and vomiting, or if you sweat a lot. The loss of too much body fluid may make it dangerous for you to take this medication. This medication may affect your coordination, reaction time, or judgment. Do not drive or operate machinery until you know how this medication affects you. Sit up or stand slowly to reduce the risk of dizzy or fainting spells. Drinking alcohol with this medication can increase the risk of these side effects. Talk to your care team if you may be pregnant. Serious birth defects can occur if you take this medication during pregnancy and for 6 months after the last dose. You  will need a negative pregnancy test before starting this medication. Contraception is recommended while taking this medication and for 6 months after the last dose. Your care team can help you find an option that works for you. If your partner can get pregnant, use a condom during sex while taking this medication and for 3 months after the last dose. Do not breastfeed while taking this medication and for 2 weeks after the last dose. This medication may cause infertility. Talk to your care team if you are concerned about your fertility. What side effects may I notice from receiving this medication? Side effects that you should report to your care team as soon as possible: Allergic reactions--skin rash, itching, hives, swelling of the face, lips, tongue, or throat Bleeding--bloody or black, tar-like stools, vomiting blood or brown material that looks like coffee grounds, red or dark brown urine, small red or purple spots on skin, unusual bruising or bleeding Blood clot--pain, swelling, or warmth in the leg, shortness of breath, chest pain Dizziness, loss of balance or coordination, confusion or trouble speaking Heart attack--pain or tightness in the chest, shoulders, arms, or jaw, nausea, shortness of breath, cold or clammy skin, feeling faint or lightheaded Heart  failure--shortness of breath, swelling of the ankles, feet, or hands, sudden weight gain, unusual weakness or fatigue Heart rhythm changes--fast or irregular heartbeat, dizziness, feeling faint or lightheaded, chest pain, trouble breathing Increase in blood pressure Infection--fever, chills, cough, sore throat, wounds that don't heal, pain or trouble when passing urine, general feeling of discomfort or being unwell Infusion reactions--chest pain, shortness of breath or trouble breathing, feeling faint or lightheaded Kidney injury--decrease in the amount of urine, swelling of the ankles, hands, or feet Liver injury--right upper belly pain,  loss of appetite, nausea, light-colored stool, dark yellow or brown urine, yellowing skin or eyes, unusual weakness or fatigue Lung injury--shortness of breath or trouble breathing, cough, spitting up blood, chest pain, fever Pulmonary hypertension--shortness of breath, chest pain, fast or irregular heartbeat, feeling faint or lightheaded, fatigue, swelling of the ankles or feet Stomach pain, bloody diarrhea, pale skin, unusual weakness or fatigue, decrease in the amount of urine, which may be signs of hemolytic uremic syndrome Sudden and severe headache, confusion, change in vision, seizures, which may be signs of posterior reversible encephalopathy syndrome (PRES) TTP--purple spots on the skin or inside the mouth, pale skin, yellowing skin or eyes, unusual weakness or fatigue, fever, fast or irregular heartbeat, confusion, change in vision, trouble speaking, trouble walking Tumor lysis syndrome (TLS)--nausea, vomiting, diarrhea, decrease in the amount of urine, dark urine, unusual weakness or fatigue, confusion, muscle pain or cramps, fast or irregular heartbeat, joint pain Side effects that usually do not require medical attention (report to your care team if they continue or are bothersome): Diarrhea Fatigue Nausea Trouble sleeping This list may not describe all possible side effects. Call your doctor for medical advice about side effects. You may report side effects to FDA at 1-800-FDA-1088. Where should I keep my medication? This medication is given in a hospital or clinic. It will not be stored at home. NOTE: This sheet is a summary. It may not cover all possible information. If you have questions about this medicine, talk to your doctor, pharmacist, or health care provider.  2023 Elsevier/Gold Standard (2022-02-01 00:00:00)  Cyclophosphamide Injection What is this medication? CYCLOPHOSPHAMIDE (sye kloe FOSS fa mide) treats some types of cancer. It works by slowing down the growth of  cancer cells. This medicine may be used for other purposes; ask your health care provider or pharmacist if you have questions. COMMON BRAND NAME(S): Cyclophosphamide, Cytoxan, Neosar What should I tell my care team before I take this medication? They need to know if you have any of these conditions: Heart disease Irregular heartbeat or rhythm Infection Kidney problems Liver disease Low blood cell levels (white cells, platelets, or red blood cells) Lung disease Previous radiation Trouble passing urine An unusual or allergic reaction to cyclophosphamide, other medications, foods, dyes, or preservatives Pregnant or trying to get pregnant Breast-feeding How should I use this medication? This medication is injected into a vein. It is given by your care team in a hospital or clinic setting. Talk to your care team about the use of this medication in children. Special care may be needed. Overdosage: If you think you have taken too much of this medicine contact a poison control center or emergency room at once. NOTE: This medicine is only for you. Do not share this medicine with others. What if I miss a dose? Keep appointments for follow-up doses. It is important not to miss your dose. Call your care team if you are unable to keep an appointment. What may interact with this  medication? Amphotericin Tania Amiodarone Azathioprine Certain antivirals for HIV or hepatitis Certain medications for blood pressure, such as enalapril, lisinopril, quinapril Cyclosporine Diuretics Etanercept Indomethacin Medications that relax muscles Metronidazole Natalizumab Tamoxifen Warfarin This list may not describe all possible interactions. Give your health care provider a list of all the medicines, herbs, non-prescription drugs, or dietary supplements you use. Also tell them if you smoke, drink alcohol, or use illegal drugs. Some items may interact with your medicine. What should I watch for while using this  medication? This medication may make you feel generally unwell. This is not uncommon as chemotherapy can affect healthy cells as well as cancer cells. Report any side effects. Continue your course of treatment even though you feel ill unless your care team tells you to stop. You may need blood work while you are taking this medication. This medication may increase your risk of getting an infection. Call your care team for advice if you get a fever, chills, sore throat, or other symptoms of a cold or flu. Do not treat yourself. Try to avoid being around people who are sick. Avoid taking medications that contain aspirin, acetaminophen, ibuprofen, naproxen, or ketoprofen unless instructed by your care team. These medications may hide a fever. Be careful brushing or flossing your teeth or using a toothpick because you may get an infection or bleed more easily. If you have any dental work done, tell your dentist you are receiving this medication. Drink water or other fluids as directed. Urinate often, even at night. Some products may contain alcohol. Ask your care team if this medication contains alcohol. Be sure to tell all care teams you are taking this medicine. Certain medicines, like metronidazole and disulfiram, can cause an unpleasant reaction when taken with alcohol. The reaction includes flushing, headache, nausea, vomiting, sweating, and increased thirst. The reaction can last from 30 minutes to several hours. Talk to your care team if you wish to become pregnant or think you might be pregnant. This medication can cause serious birth defects if taken during pregnancy and for 1 year after the last dose. A negative pregnancy test is required before starting this medication. A reliable form of contraception is recommended while taking this medication and for 1 year after the last dose. Talk to your care team about reliable forms of contraception. Do not father a child while taking this medication and for 4  months after the last dose. Use a condom during this time period. Do not breast-feed while taking this medication or for 1 week after the last dose. This medication may cause infertility. Talk to your care team if you are concerned about your fertility. Talk to your care team about your risk of cancer. You may be more at risk for certain types of cancer if you take this medication. What side effects may I notice from receiving this medication? Side effects that you should report to your care team as soon as possible: Allergic reactions--skin rash, itching, hives, swelling of the face, lips, tongue, or throat Dry cough, shortness of breath or trouble breathing Heart failure--shortness of breath, swelling of the ankles, feet, or hands, sudden weight gain, unusual weakness or fatigue Heart muscle inflammation--unusual weakness or fatigue, shortness of breath, chest pain, fast or irregular heartbeat, dizziness, swelling of the ankles, feet, or hands Heart rhythm changes--fast or irregular heartbeat, dizziness, feeling faint or lightheaded, chest pain, trouble breathing Infection--fever, chills, cough, sore throat, wounds that don't heal, pain or trouble when passing urine, general feeling of  discomfort or being unwell Kidney injury--decrease in the amount of urine, swelling of the ankles, hands, or feet Liver injury--right upper belly pain, loss of appetite, nausea, light-colored stool, dark yellow or brown urine, yellowing skin or eyes, unusual weakness or fatigue Low red blood cell level--unusual weakness or fatigue, dizziness, headache, trouble breathing Low sodium level--muscle weakness, fatigue, dizziness, headache, confusion Red or dark brown urine Unusual bruising or bleeding Side effects that usually do not require medical attention (report to your care team if they continue or are bothersome): Hair loss Irregular menstrual cycles or spotting Loss of appetite Nausea Pain, redness, or  swelling with sores inside the mouth or throat Vomiting This list may not describe all possible side effects. Call your doctor for medical advice about side effects. You may report side effects to FDA at 1-800-FDA-1088. Where should I keep my medication? This medication is given in a hospital or clinic. It will not be stored at home. NOTE: This sheet is a summary. It may not cover all possible information. If you have questions about this medicine, talk to your doctor, pharmacist, or health care provider.  2023 Elsevier/Gold Standard (2021-12-14 00:00:00)

## 2022-06-29 LAB — KAPPA/LAMBDA LIGHT CHAINS
Kappa free light chain: 13.2 mg/L (ref 3.3–19.4)
Kappa, lambda light chain ratio: 0.06 — ABNORMAL LOW (ref 0.26–1.65)
Lambda free light chains: 206 mg/L — ABNORMAL HIGH (ref 5.7–26.3)

## 2022-06-30 LAB — IGG: IgG (Immunoglobin G), Serum: 1587 mg/dL (ref 603–1613)

## 2022-07-02 ENCOUNTER — Other Ambulatory Visit: Payer: Self-pay | Admitting: Oncology

## 2022-07-03 LAB — PROTEIN ELECTROPHORESIS, SERUM
A/G Ratio: 1.1 (ref 0.7–1.7)
Albumin ELP: 3.8 g/dL (ref 2.9–4.4)
Alpha-1-Globulin: 0.3 g/dL (ref 0.0–0.4)
Alpha-2-Globulin: 0.7 g/dL (ref 0.4–1.0)
Beta Globulin: 1 g/dL (ref 0.7–1.3)
Gamma Globulin: 1.4 g/dL (ref 0.4–1.8)
Globulin, Total: 3.4 g/dL (ref 2.2–3.9)
M-Spike, %: 1 g/dL — ABNORMAL HIGH
Total Protein ELP: 7.2 g/dL (ref 6.0–8.5)

## 2022-07-04 ENCOUNTER — Other Ambulatory Visit (HOSPITAL_BASED_OUTPATIENT_CLINIC_OR_DEPARTMENT_OTHER): Payer: Self-pay

## 2022-07-05 ENCOUNTER — Inpatient Hospital Stay: Payer: Medicare Other

## 2022-07-05 ENCOUNTER — Other Ambulatory Visit (HOSPITAL_BASED_OUTPATIENT_CLINIC_OR_DEPARTMENT_OTHER): Payer: Self-pay

## 2022-07-05 VITALS — BP 128/56 | HR 72 | Temp 97.8°F | Resp 18 | Ht 69.0 in | Wt 184.6 lb

## 2022-07-05 DIAGNOSIS — C9000 Multiple myeloma not having achieved remission: Secondary | ICD-10-CM

## 2022-07-05 DIAGNOSIS — T451X5A Adverse effect of antineoplastic and immunosuppressive drugs, initial encounter: Secondary | ICD-10-CM

## 2022-07-05 DIAGNOSIS — Z5112 Encounter for antineoplastic immunotherapy: Secondary | ICD-10-CM | POA: Diagnosis not present

## 2022-07-05 DIAGNOSIS — Z9481 Bone marrow transplant status: Secondary | ICD-10-CM

## 2022-07-05 LAB — CMP (CANCER CENTER ONLY)
ALT: 16 U/L (ref 0–44)
AST: 16 U/L (ref 15–41)
Albumin: 4.2 g/dL (ref 3.5–5.0)
Alkaline Phosphatase: 48 U/L (ref 38–126)
Anion gap: 11 (ref 5–15)
BUN: 42 mg/dL — ABNORMAL HIGH (ref 8–23)
CO2: 22 mmol/L (ref 22–32)
Calcium: 10.3 mg/dL (ref 8.9–10.3)
Chloride: 105 mmol/L (ref 98–111)
Creatinine: 1.3 mg/dL — ABNORMAL HIGH (ref 0.61–1.24)
GFR, Estimated: 58 mL/min — ABNORMAL LOW (ref >60)
Glucose, Bld: 104 mg/dL — ABNORMAL HIGH (ref 70–99)
Potassium: 3.9 mmol/L (ref 3.5–5.1)
Sodium: 138 mmol/L (ref 135–145)
Total Bilirubin: 0.5 mg/dL (ref 0.3–1.2)
Total Protein: 7.5 g/dL (ref 6.5–8.1)

## 2022-07-05 LAB — CBC WITH DIFFERENTIAL (CANCER CENTER ONLY)
Abs Immature Granulocytes: 0.01 10*3/uL (ref 0.00–0.07)
Basophils Absolute: 0 10*3/uL (ref 0.0–0.1)
Basophils Relative: 0 %
Eosinophils Absolute: 0.2 10*3/uL (ref 0.0–0.5)
Eosinophils Relative: 6 %
HCT: 27.9 % — ABNORMAL LOW (ref 39.0–52.0)
Hemoglobin: 9 g/dL — ABNORMAL LOW (ref 13.0–17.0)
Immature Granulocytes: 0 %
Lymphocytes Relative: 7 %
Lymphs Abs: 0.2 10*3/uL — ABNORMAL LOW (ref 0.7–4.0)
MCH: 34.4 pg — ABNORMAL HIGH (ref 26.0–34.0)
MCHC: 32.3 g/dL (ref 30.0–36.0)
MCV: 106.5 fL — ABNORMAL HIGH (ref 80.0–100.0)
Monocytes Absolute: 0.6 10*3/uL (ref 0.1–1.0)
Monocytes Relative: 20 %
Neutro Abs: 2.1 10*3/uL (ref 1.7–7.7)
Neutrophils Relative %: 67 %
Platelet Count: 73 10*3/uL — ABNORMAL LOW (ref 150–400)
RBC: 2.62 MIL/uL — ABNORMAL LOW (ref 4.22–5.81)
RDW: 15 % (ref 11.5–15.5)
WBC Count: 3.1 10*3/uL — ABNORMAL LOW (ref 4.0–10.5)
nRBC: 0.6 % — ABNORMAL HIGH (ref 0.0–0.2)

## 2022-07-05 MED ORDER — DEXTROSE 5 % IV SOLN
56.0000 mg/m2 | Freq: Once | INTRAVENOUS | Status: AC
  Administered 2022-07-05: 110 mg via INTRAVENOUS
  Filled 2022-07-05: qty 15

## 2022-07-05 MED ORDER — COMIRNATY 30 MCG/0.3ML IM SUSY
PREFILLED_SYRINGE | INTRAMUSCULAR | 0 refills | Status: DC
  Filled 2022-07-05: qty 0.3, 1d supply, fill #0

## 2022-07-05 MED ORDER — SODIUM CHLORIDE 0.9 % IV SOLN: Freq: Once | INTRAVENOUS | Status: AC

## 2022-07-05 MED ORDER — SODIUM CHLORIDE 0.9 % IV SOLN
250.0000 mg/m2 | Freq: Once | INTRAVENOUS | Status: AC
  Administered 2022-07-05: 500 mg via INTRAVENOUS
  Filled 2022-07-05: qty 25

## 2022-07-05 MED ORDER — DEXAMETHASONE 4 MG PO TABS
10.0000 mg | ORAL_TABLET | Freq: Once | ORAL | Status: AC
  Administered 2022-07-05: 10 mg via ORAL
  Filled 2022-07-05: qty 3

## 2022-07-05 NOTE — Progress Notes (Signed)
Per Dr. Benay Spice: OK to treat today with Kyprolis and Cytoxan w/platelet count 73,000. Please review with patient to call for bleeding and easy bruising. May need to hold cytoxan next week.

## 2022-07-05 NOTE — Patient Instructions (Signed)
Andre [name]   Discharge Instructions: Thank you for choosing [facility] to provide your oncology and hematology care.   If you have a lab appointment with the [facility], please go directly to the [facility] and check in at the registration area.   Wear comfortable clothing and clothing appropriate for easy access to any Portacath or PICC line.   We strive to give you quality time with your provider. You may need to reschedule your appointment if you arrive late (15 or more minutes).  Arriving late affects you and other patients whose appointments are after yours.  Also, if you miss three or more appointments without notifying the office, you may be dismissed from the clinic at the provider's discretion.      For prescription refill requests, have your pharmacy contact our office and allow 72 hours for refills to be completed.    Today you received the following chemotherapy and/or immunotherapy agents Carfilzomib (KYPROLIS) & Cyclophosphamide (CYTOXAN).      To help prevent nausea and vomiting after your treatment, we encourage you to take your nausea medication as directed.  BELOW ARE SYMPTOMS THAT SHOULD BE REPORTED IMMEDIATELY: *FEVER GREATER THAN 100.4 F (38 C) OR HIGHER *CHILLS OR SWEATING *NAUSEA AND VOMITING THAT IS NOT CONTROLLED WITH YOUR NAUSEA MEDICATION *UNUSUAL SHORTNESS OF BREATH *UNUSUAL BRUISING OR BLEEDING *URINARY PROBLEMS (pain or burning when urinating, or frequent urination) *BOWEL PROBLEMS (unusual diarrhea, constipation, pain near the anus) TENDERNESS IN MOUTH AND THROAT WITH OR WITHOUT PRESENCE OF ULCERS (sore throat, sores in mouth, or a toothache) UNUSUAL RASH, SWELLING OR PAIN  UNUSUAL VAGINAL DISCHARGE OR ITCHING   Items with * indicate a potential emergency and should be followed up as soon as possible or go to the Emergency Department if any problems should occur.  Please show the CHEMOTHERAPY ALERT CARD or  IMMUNOTHERAPY ALERT CARD at check-in to the Emergency Department and triage nurse.  Should you have questions after your visit or need to cancel or reschedule your appointment, please contact [facility]  Dept: [phone]  and follow the prompts.  Office hours are 8:00 a.m. to 4:30 p.m. Monday - Friday. Please note that voicemails left after 4:00 p.m. may not be returned until the following business day.  We are closed weekends and major holidays. You have access to a nurse at all times for urgent questions. Please call the main number to the clinic Dept: [phone] and follow the prompts.   For any non-urgent questions, you may also contact your provider using MyChart. We now offer e-Visits for anyone 59 and older to request care online for non-urgent symptoms. For details visit [email or web address].   Also download the MyChart app! Go to the app store, search "MyChart", open the app, select [facility], and log in with your MyChart username and password.  Masks are optional in the cancer centers. If you would like for your care team to wear a mask while they are taking care of you, please let them know. You may have one support person who is at least 73 years old accompany you for your appointments.  Carfilzomib Injection What is this medication? CARFILZOMIB (kar FILZ oh mib) treats multiple myeloma, a type of bone marrow cancer. It works by blocking a protein that causes cancer cells to grow and multiply. This helps to slow or stop the spread of cancer cells. This medicine may be used for other purposes; ask your health  care provider or pharmacist if you have questions. COMMON BRAND NAME(S): KYPROLIS What should I tell my care team before I take this medication? They need to know if you have any of these conditions: Heart disease History of blood clots Irregular heartbeat Kidney disease Liver disease Lung or breathing disease An unusual or allergic  reaction to carfilzomib, or other medications, foods, dyes, or preservatives If you or your partner are pregnant or trying to get pregnant Breastfeeding How should I use this medication? This medication is injected into a vein. It is given by your care team in a hospital or clinic setting. Talk to your care team about the use of this medication in children. Special care may be needed. Overdosage: If you think you have taken too much of this medicine contact a poison control center or emergency room at once. NOTE: This medicine is only for you. Do not share this medicine with others. What if I miss a dose? Keep appointments for follow-up doses. It is important not to miss your dose. Call your care team if you are unable to keep an appointment. What may interact with this medication? Interactions are not expected. This list may not describe all possible interactions. Give your health care provider a list of all the medicines, herbs, non-prescription drugs, or dietary supplements you use. Also tell them if you smoke, drink alcohol, or use illegal drugs. Some items may interact with your medicine. What should I watch for while using this medication? Your condition will be monitored carefully while you are receiving this medication. You may need blood work while taking this medication. Check with your care team if you have severe diarrhea, nausea, and vomiting, or if you sweat a lot. The loss of too much body fluid may make it dangerous for you to take this medication. This medication may affect your coordination, reaction time, or judgment. Do not drive or operate machinery until you know how this medication affects you. Sit up or stand slowly to reduce the risk of dizzy or fainting spells. Drinking alcohol with this medication can increase the risk of these side effects. Talk to your care team if you may be pregnant. Serious birth defects can occur if you take this medication during pregnancy and for 6  months after the last dose. You will need a negative pregnancy test before starting this medication. Contraception is recommended while taking this medication and for 6 months after the last dose. Your care team can help you find an option that works for you. If your partner can get pregnant, use a condom during sex while taking this medication and for 3 months after the last dose. Do not breastfeed while taking this medication and for 2 weeks after the last dose. This medication may cause infertility. Talk to your care team if you are concerned about your fertility. What side effects may I notice from receiving this medication? Side effects that you should report to your care team as soon as possible: Allergic reactions--skin rash, itching, hives, swelling of the face, lips, tongue, or throat Bleeding--bloody or black, tar-like stools, vomiting blood or brown material that looks like coffee grounds, red or dark brown urine, small red or purple spots on skin, unusual bruising or bleeding Blood clot--pain, swelling, or warmth in the leg, shortness of breath, chest pain Dizziness, loss of balance or coordination, confusion or trouble speaking Heart attack--pain or tightness in the chest, shoulders, arms, or jaw, nausea, shortness of breath, cold or clammy skin, feeling faint  or lightheaded Heart failure--shortness of breath, swelling of the ankles, feet, or hands, sudden weight gain, unusual weakness or fatigue Heart rhythm changes--fast or irregular heartbeat, dizziness, feeling faint or lightheaded, chest pain, trouble breathing Increase in blood pressure Infection--fever, chills, cough, sore throat, wounds that don't heal, pain or trouble when passing urine, general feeling of discomfort or being unwell Infusion reactions--chest pain, shortness of breath or trouble breathing, feeling faint or lightheaded Kidney injury--decrease in the amount of urine, swelling of the ankles, hands, or feet Liver  injury--right upper belly pain, loss of appetite, nausea, light-colored stool, dark yellow or brown urine, yellowing skin or eyes, unusual weakness or fatigue Lung injury--shortness of breath or trouble breathing, cough, spitting up blood, chest pain, fever Pulmonary hypertension--shortness of breath, chest pain, fast or irregular heartbeat, feeling faint or lightheaded, fatigue, swelling of the ankles or feet Stomach pain, bloody diarrhea, pale skin, unusual weakness or fatigue, decrease in the amount of urine, which may be signs of hemolytic uremic syndrome Sudden and severe headache, confusion, change in vision, seizures, which may be signs of posterior reversible encephalopathy syndrome (PRES) TTP--purple spots on the skin or inside the mouth, pale skin, yellowing skin or eyes, unusual weakness or fatigue, fever, fast or irregular heartbeat, confusion, change in vision, trouble speaking, trouble walking Tumor lysis syndrome (TLS)--nausea, vomiting, diarrhea, decrease in the amount of urine, dark urine, unusual weakness or fatigue, confusion, muscle pain or cramps, fast or irregular heartbeat, joint pain Side effects that usually do not require medical attention (report to your care team if they continue or are bothersome): Diarrhea Fatigue Nausea Trouble sleeping This list may not describe all possible side effects. Call your doctor for medical advice about side effects. You may report side effects to FDA at 1-800-FDA-1088. Where should I keep my medication? This medication is given in a hospital or clinic. It will not be stored at home. NOTE: This sheet is a summary. It may not cover all possible information. If you have questions about this medicine, talk to your doctor, pharmacist, or health care provider.  2023 Elsevier/Gold Standard (2022-02-01 00:00:00)  Cyclophosphamide Injection What is this medication? CYCLOPHOSPHAMIDE (sye kloe FOSS fa mide) treats some types of cancer. It works by  slowing down the growth of cancer cells. This medicine may be used for other purposes; ask your health care provider or pharmacist if you have questions. COMMON BRAND NAME(S): Cyclophosphamide, Cytoxan, Neosar What should I tell my care team before I take this medication? They need to know if you have any of these conditions: Heart disease Irregular heartbeat or rhythm Infection Kidney problems Liver disease Low blood cell levels (white cells, platelets, or red blood cells) Lung disease Previous radiation Trouble passing urine An unusual or allergic reaction to cyclophosphamide, other medications, foods, dyes, or preservatives Pregnant or trying to get pregnant Breast-feeding How should I use this medication? This medication is injected into a vein. It is given by your care team in a hospital or clinic setting. Talk to your care team about the use of this medication in children. Special care may be needed. Overdosage: If you think you have taken too much of this medicine contact a poison control center or emergency room at once. NOTE: This medicine is only for you. Do not share this medicine with others. What if I miss a dose? Keep appointments for follow-up doses. It is important not to miss your dose. Call your care team if you are unable to keep an appointment. What may  interact with this medication? Amphotericin Pharaoh Amiodarone Azathioprine Certain antivirals for HIV or hepatitis Certain medications for blood pressure, such as enalapril, lisinopril, quinapril Cyclosporine Diuretics Etanercept Indomethacin Medications that relax muscles Metronidazole Natalizumab Tamoxifen Warfarin This list may not describe all possible interactions. Give your health care provider a list of all the medicines, herbs, non-prescription drugs, or dietary supplements you use. Also tell them if you smoke, drink alcohol, or use illegal drugs. Some items may interact with your medicine. What should I  watch for while using this medication? This medication may make you feel generally unwell. This is not uncommon as chemotherapy can affect healthy cells as well as cancer cells. Report any side effects. Continue your course of treatment even though you feel ill unless your care team tells you to stop. You may need blood work while you are taking this medication. This medication may increase your risk of getting an infection. Call your care team for advice if you get a fever, chills, sore throat, or other symptoms of a cold or flu. Do not treat yourself. Try to avoid being around people who are sick. Avoid taking medications that contain aspirin, acetaminophen, ibuprofen, naproxen, or ketoprofen unless instructed by your care team. These medications may hide a fever. Be careful brushing or flossing your teeth or using a toothpick because you may get an infection or bleed more easily. If you have any dental work done, tell your dentist you are receiving this medication. Drink water or other fluids as directed. Urinate often, even at night. Some products may contain alcohol. Ask your care team if this medication contains alcohol. Be sure to tell all care teams you are taking this medicine. Certain medicines, like metronidazole and disulfiram, can cause an unpleasant reaction when taken with alcohol. The reaction includes flushing, headache, nausea, vomiting, sweating, and increased thirst. The reaction can last from 30 minutes to several hours. Talk to your care team if you wish to become pregnant or think you might be pregnant. This medication can cause serious birth defects if taken during pregnancy and for 1 year after the last dose. A negative pregnancy test is required before starting this medication. A reliable form of contraception is recommended while taking this medication and for 1 year after the last dose. Talk to your care team about reliable forms of contraception. Do not father a child while  taking this medication and for 4 months after the last dose. Use a condom during this time period. Do not breast-feed while taking this medication or for 1 week after the last dose. This medication may cause infertility. Talk to your care team if you are concerned about your fertility. Talk to your care team about your risk of cancer. You may be more at risk for certain types of cancer if you take this medication. What side effects may I notice from receiving this medication? Side effects that you should report to your care team as soon as possible: Allergic reactions--skin rash, itching, hives, swelling of the face, lips, tongue, or throat Dry cough, shortness of breath or trouble breathing Heart failure--shortness of breath, swelling of the ankles, feet, or hands, sudden weight gain, unusual weakness or fatigue Heart muscle inflammation--unusual weakness or fatigue, shortness of breath, chest pain, fast or irregular heartbeat, dizziness, swelling of the ankles, feet, or hands Heart rhythm changes--fast or irregular heartbeat, dizziness, feeling faint or lightheaded, chest pain, trouble breathing Infection--fever, chills, cough, sore throat, wounds that don't heal, pain or trouble when passing urine,  general feeling of discomfort or being unwell Kidney injury--decrease in the amount of urine, swelling of the ankles, hands, or feet Liver injury--right upper belly pain, loss of appetite, nausea, light-colored stool, dark yellow or brown urine, yellowing skin or eyes, unusual weakness or fatigue Low red blood cell level--unusual weakness or fatigue, dizziness, headache, trouble breathing Low sodium level--muscle weakness, fatigue, dizziness, headache, confusion Red or dark brown urine Unusual bruising or bleeding Side effects that usually do not require medical attention (report to your care team if they continue or are bothersome): Hair loss Irregular menstrual cycles or spotting Loss of  appetite Nausea Pain, redness, or swelling with sores inside the mouth or throat Vomiting This list may not describe all possible side effects. Call your doctor for medical advice about side effects. You may report side effects to FDA at 1-800-FDA-1088. Where should I keep my medication? This medication is given in a hospital or clinic. It will not be stored at home. NOTE: This sheet is a summary. It may not cover all possible information. If you have questions about this medicine, talk to your doctor, pharmacist, or health care provider.  2023 Elsevier/Gold Standard (2021-12-14 00:00:00)  

## 2022-07-06 ENCOUNTER — Other Ambulatory Visit: Payer: Medicare Other

## 2022-07-06 ENCOUNTER — Ambulatory Visit: Payer: Medicare Other | Admitting: Oncology

## 2022-07-09 ENCOUNTER — Other Ambulatory Visit: Payer: Self-pay | Admitting: Oncology

## 2022-07-12 ENCOUNTER — Inpatient Hospital Stay: Payer: Medicare Other

## 2022-07-12 ENCOUNTER — Encounter: Payer: Self-pay | Admitting: Nurse Practitioner

## 2022-07-12 ENCOUNTER — Inpatient Hospital Stay (HOSPITAL_BASED_OUTPATIENT_CLINIC_OR_DEPARTMENT_OTHER): Payer: Medicare Other | Admitting: Nurse Practitioner

## 2022-07-12 VITALS — BP 150/73 | HR 63 | Resp 20

## 2022-07-12 DIAGNOSIS — C9000 Multiple myeloma not having achieved remission: Secondary | ICD-10-CM

## 2022-07-12 DIAGNOSIS — Z5112 Encounter for antineoplastic immunotherapy: Secondary | ICD-10-CM | POA: Diagnosis not present

## 2022-07-12 DIAGNOSIS — G620 Drug-induced polyneuropathy: Secondary | ICD-10-CM

## 2022-07-12 DIAGNOSIS — T451X5A Adverse effect of antineoplastic and immunosuppressive drugs, initial encounter: Secondary | ICD-10-CM | POA: Diagnosis not present

## 2022-07-12 DIAGNOSIS — Z9481 Bone marrow transplant status: Secondary | ICD-10-CM

## 2022-07-12 LAB — CBC WITH DIFFERENTIAL (CANCER CENTER ONLY)
Abs Immature Granulocytes: 0.01 10*3/uL (ref 0.00–0.07)
Basophils Absolute: 0 10*3/uL (ref 0.0–0.1)
Basophils Relative: 1 %
Eosinophils Absolute: 0.2 10*3/uL (ref 0.0–0.5)
Eosinophils Relative: 4 %
HCT: 29.1 % — ABNORMAL LOW (ref 39.0–52.0)
Hemoglobin: 9.4 g/dL — ABNORMAL LOW (ref 13.0–17.0)
Immature Granulocytes: 0 %
Lymphocytes Relative: 11 %
Lymphs Abs: 0.4 10*3/uL — ABNORMAL LOW (ref 0.7–4.0)
MCH: 34.6 pg — ABNORMAL HIGH (ref 26.0–34.0)
MCHC: 32.3 g/dL (ref 30.0–36.0)
MCV: 107 fL — ABNORMAL HIGH (ref 80.0–100.0)
Monocytes Absolute: 0.9 10*3/uL (ref 0.1–1.0)
Monocytes Relative: 25 %
Neutro Abs: 2.2 10*3/uL (ref 1.7–7.7)
Neutrophils Relative %: 59 %
Platelet Count: 83 10*3/uL — ABNORMAL LOW (ref 150–400)
RBC: 2.72 MIL/uL — ABNORMAL LOW (ref 4.22–5.81)
RDW: 15.3 % (ref 11.5–15.5)
WBC Count: 3.7 10*3/uL — ABNORMAL LOW (ref 4.0–10.5)
nRBC: 0.5 % — ABNORMAL HIGH (ref 0.0–0.2)

## 2022-07-12 LAB — CMP (CANCER CENTER ONLY)
ALT: 11 U/L (ref 0–44)
AST: 14 U/L — ABNORMAL LOW (ref 15–41)
Albumin: 4.2 g/dL (ref 3.5–5.0)
Alkaline Phosphatase: 44 U/L (ref 38–126)
Anion gap: 10 (ref 5–15)
BUN: 39 mg/dL — ABNORMAL HIGH (ref 8–23)
CO2: 21 mmol/L — ABNORMAL LOW (ref 22–32)
Calcium: 10.1 mg/dL (ref 8.9–10.3)
Chloride: 104 mmol/L (ref 98–111)
Creatinine: 1.3 mg/dL — ABNORMAL HIGH (ref 0.61–1.24)
GFR, Estimated: 58 mL/min — ABNORMAL LOW (ref >60)
Glucose, Bld: 96 mg/dL (ref 70–99)
Potassium: 3.9 mmol/L (ref 3.5–5.1)
Sodium: 135 mmol/L (ref 135–145)
Total Bilirubin: 0.5 mg/dL (ref 0.3–1.2)
Total Protein: 7.7 g/dL (ref 6.5–8.1)

## 2022-07-12 MED ORDER — DEXAMETHASONE 4 MG PO TABS
10.0000 mg | ORAL_TABLET | Freq: Once | ORAL | Status: AC
  Administered 2022-07-12: 10 mg via ORAL
  Filled 2022-07-12: qty 3

## 2022-07-12 MED ORDER — SODIUM CHLORIDE 0.9 % IV SOLN: Freq: Once | INTRAVENOUS | Status: AC

## 2022-07-12 MED ORDER — SODIUM CHLORIDE 0.9 % IV SOLN
250.0000 mg/m2 | Freq: Once | INTRAVENOUS | Status: AC
  Administered 2022-07-12: 500 mg via INTRAVENOUS
  Filled 2022-07-12: qty 25

## 2022-07-12 MED ORDER — DEXTROSE 5 % IV SOLN
56.0000 mg/m2 | Freq: Once | INTRAVENOUS | Status: AC
  Administered 2022-07-12: 110 mg via INTRAVENOUS
  Filled 2022-07-12: qty 30

## 2022-07-12 NOTE — Progress Notes (Signed)
[facility] OFFICE PROGRESS NOTE   Diagnosis: Multiple myeloma  INTERVAL HISTORY:   Andre [name] returns as scheduled.  He began another cycle of Carfilzomib/Cytoxan/Decadron 06/28/2022.  He denies nausea/vomiting.  No mouth sores.  No diarrhea.  No rash.  Scattered areas of mild pruritus.  He denies pain.  He has a good appetite.  No fever.  He has occasional mild dyspnea.  No cough.  Objective:  Vital signs in last 24 hours:  Blood pressure 108/63, pulse 88, temperature 98.1 F (36.7 C), temperature source Oral, resp. rate 20, height 5' 9"  (1.753 m), weight 182 lb 6.4 oz (82.7 kg), SpO2 100 %.    HEENT: No thrush or ulcers. Resp: Lungs clear bilaterally. Cardio: Regular rate and rhythm. GI: Abdomen soft and nontender.  No hepatosplenomegaly.   Vascular: Trace bilateral ankle edema. Neuro: Alert and oriented. Skin: No rash.   Lab Results:  Lab Results  Component Value Date   WBC 3.7 (L) 07/12/2022   HGB 9.4 (L) 07/12/2022   HCT 29.1 (L) 07/12/2022   MCV 107.0 (H) 07/12/2022   PLT 83 (L) 07/12/2022   NEUTROABS 2.2 07/12/2022    Imaging:  No results found.  Medications: I have reviewed the patient's current medications.  Assessment/Plan: Multiple myeloma- IgG lambda  bortezomib (subcutaneously), lenalidomide, and dexamethasone, with repeat bone marrow biopsy May of 2012 showing 10% plasmacytosis   (2) high-dose chemotherapy with BCNU and melphalan at [facility], followed by stem cell rescue July of 2012   (3) on zoledronic acid started December of 2012, initially monthly, currently given every 3 months, most recent dose  12/07/2015   (4) low-dose lenalidomide resumed April 2013, interrupted several times.  Resumed again on 02/19/2013, ata dose of 5 mg daily, 21 days on and 7 days off, later further reduced to 7 days on, 7 days off   (5) CNS symptoms and abnormal brain MRI extensively evaluated by neurology with no definitive diagnosis established   (6)  rising M spike noted June 2014 but did not meet criteria for carfilzomib study   (7) on lenalidomide 25 mg daily, 14 days on, 7 days off, starting 04/18/2013, interrupted December 2014 because of rash;             (a) resumed January 2015 at 10 mg/ day at 21 days on/ 7 days off             (Xai) starting 08/18/2014 decreased to 10 mg/ day 14 days on, 7 days off because of cytopenias             (c) as of February 2016 was on 5 mg daily 7 days on 7 days off             (d) lenalidomide discontinued December 2016 with evidence of disease progression   (8) transient global amnesia 05/29/2015, resolved without intervention   (9) starting PVD 10/25/2015 w ASA 325 thromboprophylaxis, valacyclovir prophylaxis, last dose 12/17/2015             (a) pomalidomide 4 mg/d days 1-14             (Manuel) bortezomib sQ days 2,5,9,12 of each 21 day cycle             (c) dexamethasome 20 mg two days a week             (d) dexamethasone bortezomib and pomalidomide discontinued late December 2018 with poor tolerance   (10) metapneumovirus pneumonia April 2017             (  a) completing course of steroids and week of bactrim mid April 2017   (11) status post second autologous transplant at [facility] 02/04/2016(preparatory regimen melphalan 200 mg/m)             (a) received twelve-month vaccinations 03/14/2017 (DPT, Haemophilus, Pneumovax 13, polio)             ([place]) 14 months injections 05/04/2016 include DTaP, Hib conjugate, HepB energex Vikash 20 mcg/ml, Prevnar 13             (c) 24 month vaccines due at [facility] June 2019   (12) maintenance therapy started November 2017, consisting of             (a) bortezomib 1.3 mg/M2 every 14 days, first dose 07/27/2016             ([place]) pomalidomide 1 mg days 1-21 Q28 days, started 07/19/2016             (c) zolendronate monthly started 07/27/2016 (previously Q 3 months) however patient unable to tolerate, and changed back to q3 months in April, 2018             (d) Bortezomib changed to  monthly as of June 2018 because of tolerance issues, however discontinued after 09/14/2017 dose because of a rise in his M spike             (e) pomalidomide held after 10/18/2017 in preparation for possible study at [place] (venetoclax)--never resumed             (f) with numbers actually improved off treatment, resumed every 2-week bortezomib 12/25/2017             (g) changed to every 3-week bortezomib as of 03/17/2019             (h) briefly on weekly treatments times 22 October 2020 due to increase in M spike             (I) maintenance therapy discontinued with evidence of progression   (13) bortezomib/daratumumab/dexamethasone started 12/20/2020.             (a) Decadron dose dropped to 10 mg day of and day following treatment starting May 2022             ([place]) day 8 bortezomib omitted beginning with the June cycle per patient preference             (C) treatment changed to monthly daratumumab/Velcade/Decadron beginning 06/27/2021-patient decision (14) weekly Velcade/Cytoxan beginning 11/14/2021 (15) changed to Cytoxan/carfilzomib/Decadron 12/19/2021-treatment placed on hold 05/15/2022, carfilzomib 05/24/2022 and 05/31/2022 16.  Leukocyte a pheresis procedure at [facility] 06/15/2022 17.  Treatment resumed with Cytoxan/carfilzomib/Decadron 06/28/2022      Disposition: Andre [name] appears stable.  He has resumed treatment with Cytoxan/Carfilzomib/Decadron weekly x3 followed by 1 week break.  Plan to proceed with treatment today as scheduled.  CBC and chemistry panel reviewed.  Labs adequate to proceed as above.  White count and platelet count stable to mildly improved.  He will begin the next cycle of Cytoxan/Carfilzomib/Decadron in 2 weeks.  We will see him in follow-up 08/09/2022.  We are available to see him sooner if needed.    [name] ANP/GNP-BC   07/12/2022  9:40 AM

## 2022-07-12 NOTE — Patient Instructions (Signed)
Andre [name]   Discharge Instructions: Thank you for choosing [facility] to provide your oncology and hematology care.   If you have a lab appointment with the [facility], please go directly to the [facility] and check in at the registration area.   Wear comfortable clothing and clothing appropriate for easy access to any Portacath or PICC line.   We strive to give you quality time with your provider. You may need to reschedule your appointment if you arrive late (15 or more minutes).  Arriving late affects you and other patients whose appointments are after yours.  Also, if you miss three or more appointments without notifying the office, you may be dismissed from the clinic at the provider's discretion.      For prescription refill requests, have your pharmacy contact our office and allow 72 hours for refills to be completed.    Today you received the following chemotherapy and/or immunotherapy agents Cyclophosphamide (CYTOXAN) & Carfilzomib (KYPROLIS).      To help prevent nausea and vomiting after your treatment, we encourage you to take your nausea medication as directed.  BELOW ARE SYMPTOMS THAT SHOULD BE REPORTED IMMEDIATELY: *FEVER GREATER THAN 100.4 F (38 C) OR HIGHER *CHILLS OR SWEATING *NAUSEA AND VOMITING THAT IS NOT CONTROLLED WITH YOUR NAUSEA MEDICATION *UNUSUAL SHORTNESS OF BREATH *UNUSUAL BRUISING OR BLEEDING *URINARY PROBLEMS (pain or burning when urinating, or frequent urination) *BOWEL PROBLEMS (unusual diarrhea, constipation, pain near the anus) TENDERNESS IN MOUTH AND THROAT WITH OR WITHOUT PRESENCE OF ULCERS (sore throat, sores in mouth, or a toothache) UNUSUAL RASH, SWELLING OR PAIN  UNUSUAL VAGINAL DISCHARGE OR ITCHING   Items with * indicate a potential emergency and should be followed up as soon as possible or go to the Emergency Department if any problems should occur.  Please show the CHEMOTHERAPY ALERT CARD or  IMMUNOTHERAPY ALERT CARD at check-in to the Emergency Department and triage nurse.  Should you have questions after your visit or need to cancel or reschedule your appointment, please contact [facility]  Dept: [phone]  and follow the prompts.  Office hours are 8:00 a.m. to 4:30 p.m. Monday - Friday. Please note that voicemails left after 4:00 p.m. may not be returned until the following business day.  We are closed weekends and major holidays. You have access to a nurse at all times for urgent questions. Please call the main number to the clinic Dept: [phone] and follow the prompts.   For any non-urgent questions, you may also contact your provider using MyChart. We now offer e-Visits for anyone 58 and older to request care online for non-urgent symptoms. For details visit [email or web address].   Also download the MyChart app! Go to the app store, search "MyChart", open the app, select , and log in with your MyChart username and password.  Masks are optional in the cancer centers. If you would like for your care team to wear a mask while they are taking care of you, please let them know. You may have one support person who is at least 73 years old accompany you for your appointments.  Cyclophosphamide Injection What is this medication? CYCLOPHOSPHAMIDE (sye kloe FOSS fa mide) treats some types of cancer. It works by slowing down the growth of cancer cells. This medicine may be used for other purposes; ask your health care provider or pharmacist if you have questions. COMMON BRAND NAME(S): Cyclophosphamide, Cytoxan, Neosar What should I tell  my care team before I take this medication? They need to know if you have any of these conditions: Heart disease Irregular heartbeat or rhythm Infection Kidney problems Liver disease Low blood cell levels (white cells, platelets, or red blood cells) Lung disease Previous radiation Trouble passing  urine An unusual or allergic reaction to cyclophosphamide, other medications, foods, dyes, or preservatives Pregnant or trying to get pregnant Breast-feeding How should I use this medication? This medication is injected into a vein. It is given by your care team in a hospital or clinic setting. Talk to your care team about the use of this medication in children. Special care may be needed. Overdosage: If you think you have taken too much of this medicine contact a poison control center or emergency room at once. NOTE: This medicine is only for you. Do not share this medicine with others. What if I miss a dose? Keep appointments for follow-up doses. It is important not to miss your dose. Call your care team if you are unable to keep an appointment. What may interact with this medication? Amphotericin Isidor Amiodarone Azathioprine Certain antivirals for HIV or hepatitis Certain medications for blood pressure, such as enalapril, lisinopril, quinapril Cyclosporine Diuretics Etanercept Indomethacin Medications that relax muscles Metronidazole Natalizumab Tamoxifen Warfarin This list may not describe all possible interactions. Give your health care provider a list of all the medicines, herbs, non-prescription drugs, or dietary supplements you use. Also tell them if you smoke, drink alcohol, or use illegal drugs. Some items may interact with your medicine. What should I watch for while using this medication? This medication may make you feel generally unwell. This is not uncommon as chemotherapy can affect healthy cells as well as cancer cells. Report any side effects. Continue your course of treatment even though you feel ill unless your care team tells you to stop. You may need blood work while you are taking this medication. This medication may increase your risk of getting an infection. Call your care team for advice if you get a fever, chills, sore throat, or other symptoms of a cold or flu.  Do not treat yourself. Try to avoid being around people who are sick. Avoid taking medications that contain aspirin, acetaminophen, ibuprofen, naproxen, or ketoprofen unless instructed by your care team. These medications may hide a fever. Be careful brushing or flossing your teeth or using a toothpick because you may get an infection or bleed more easily. If you have any dental work done, tell your dentist you are receiving this medication. Drink water or other fluids as directed. Urinate often, even at night. Some products may contain alcohol. Ask your care team if this medication contains alcohol. Be sure to tell all care teams you are taking this medicine. Certain medicines, like metronidazole and disulfiram, can cause an unpleasant reaction when taken with alcohol. The reaction includes flushing, headache, nausea, vomiting, sweating, and increased thirst. The reaction can last from 30 minutes to several hours. Talk to your care team if you wish to become pregnant or think you might be pregnant. This medication can cause serious birth defects if taken during pregnancy and for 1 year after the last dose. A negative pregnancy test is required before starting this medication. A reliable form of contraception is recommended while taking this medication and for 1 year after the last dose. Talk to your care team about reliable forms of contraception. Do not father a child while taking this medication and for 4 months after the last dose.  Use a condom during this time period. Do not breast-feed while taking this medication or for 1 week after the last dose. This medication may cause infertility. Talk to your care team if you are concerned about your fertility. Talk to your care team about your risk of cancer. You may be more at risk for certain types of cancer if you take this medication. What side effects may I notice from receiving this medication? Side effects that you should report to your care team as  soon as possible: Allergic reactions--skin rash, itching, hives, swelling of the face, lips, tongue, or throat Dry cough, shortness of breath or trouble breathing Heart failure--shortness of breath, swelling of the ankles, feet, or hands, sudden weight gain, unusual weakness or fatigue Heart muscle inflammation--unusual weakness or fatigue, shortness of breath, chest pain, fast or irregular heartbeat, dizziness, swelling of the ankles, feet, or hands Heart rhythm changes--fast or irregular heartbeat, dizziness, feeling faint or lightheaded, chest pain, trouble breathing Infection--fever, chills, cough, sore throat, wounds that don't heal, pain or trouble when passing urine, general feeling of discomfort or being unwell Kidney injury--decrease in the amount of urine, swelling of the ankles, hands, or feet Liver injury--right upper belly pain, loss of appetite, nausea, light-colored stool, dark yellow or brown urine, yellowing skin or eyes, unusual weakness or fatigue Low red blood cell level--unusual weakness or fatigue, dizziness, headache, trouble breathing Low sodium level--muscle weakness, fatigue, dizziness, headache, confusion Red or dark brown urine Unusual bruising or bleeding Side effects that usually do not require medical attention (report to your care team if they continue or are bothersome): Hair loss Irregular menstrual cycles or spotting Loss of appetite Nausea Pain, redness, or swelling with sores inside the mouth or throat Vomiting This list may not describe all possible side effects. Call your doctor for medical advice about side effects. You may report side effects to FDA at 1-800-FDA-1088. Where should I keep my medication? This medication is given in a hospital or clinic. It will not be stored at home. NOTE: This sheet is a summary. It may not cover all possible information. If you have questions about this medicine, talk to your doctor, pharmacist, or health care  provider.  2023 Elsevier/Gold Standard (2021-10-25 00:00:00)  Carfilzomib Injection What is this medication? CARFILZOMIB (kar FILZ oh mib) treats multiple myeloma, a type of bone marrow cancer. It works by blocking a protein that causes cancer cells to grow and multiply. This helps to slow or stop the spread of cancer cells. This medicine may be used for other purposes; ask your health care provider or pharmacist if you have questions. COMMON BRAND NAME(S): KYPROLIS What should I tell my care team before I take this medication? They need to know if you have any of these conditions: Heart disease History of blood clots Irregular heartbeat Kidney disease Liver disease Lung or breathing disease An unusual or allergic reaction to carfilzomib, or other medications, foods, dyes, or preservatives If you or your partner are pregnant or trying to get pregnant Breastfeeding How should I use this medication? This medication is injected into a vein. It is given by your care team in a hospital or clinic setting. Talk to your care team about the use of this medication in children. Special care may be needed. Overdosage: If you think you have taken too much of this medicine contact a poison control center or emergency room at once. NOTE: This medicine is only for you. Do not share this medicine with others. What  if I miss a dose? Keep appointments for follow-up doses. It is important not to miss your dose. Call your care team if you are unable to keep an appointment. What may interact with this medication? Interactions are not expected. This list may not describe all possible interactions. Give your health care provider a list of all the medicines, herbs, non-prescription drugs, or dietary supplements you use. Also tell them if you smoke, drink alcohol, or use illegal drugs. Some items may interact with your medicine. What should I watch for while using this medication? Your condition will be  monitored carefully while you are receiving this medication. You may need blood work while taking this medication. Check with your care team if you have severe diarrhea, nausea, and vomiting, or if you sweat a lot. The loss of too much body fluid may make it dangerous for you to take this medication. This medication may affect your coordination, reaction time, or judgment. Do not drive or operate machinery until you know how this medication affects you. Sit up or stand slowly to reduce the risk of dizzy or fainting spells. Drinking alcohol with this medication can increase the risk of these side effects. Talk to your care team if you may be pregnant. Serious birth defects can occur if you take this medication during pregnancy and for 6 months after the last dose. You will need a negative pregnancy test before starting this medication. Contraception is recommended while taking this medication and for 6 months after the last dose. Your care team can help you find an option that works for you. If your partner can get pregnant, use a condom during sex while taking this medication and for 3 months after the last dose. Do not breastfeed while taking this medication and for 2 weeks after the last dose. This medication may cause infertility. Talk to your care team if you are concerned about your fertility. What side effects may I notice from receiving this medication? Side effects that you should report to your care team as soon as possible: Allergic reactions--skin rash, itching, hives, swelling of the face, lips, tongue, or throat Bleeding--bloody or black, tar-like stools, vomiting blood or brown material that looks like coffee grounds, red or dark brown urine, small red or purple spots on skin, unusual bruising or bleeding Blood clot--pain, swelling, or warmth in the leg, shortness of breath, chest pain Dizziness, loss of balance or coordination, confusion or trouble speaking Heart attack--pain or tightness  in the chest, shoulders, arms, or jaw, nausea, shortness of breath, cold or clammy skin, feeling faint or lightheaded Heart failure--shortness of breath, swelling of the ankles, feet, or hands, sudden weight gain, unusual weakness or fatigue Heart rhythm changes--fast or irregular heartbeat, dizziness, feeling faint or lightheaded, chest pain, trouble breathing Increase in blood pressure Infection--fever, chills, cough, sore throat, wounds that don't heal, pain or trouble when passing urine, general feeling of discomfort or being unwell Infusion reactions--chest pain, shortness of breath or trouble breathing, feeling faint or lightheaded Kidney injury--decrease in the amount of urine, swelling of the ankles, hands, or feet Liver injury--right upper belly pain, loss of appetite, nausea, light-colored stool, dark yellow or brown urine, yellowing skin or eyes, unusual weakness or fatigue Lung injury--shortness of breath or trouble breathing, cough, spitting up blood, chest pain, fever Pulmonary hypertension--shortness of breath, chest pain, fast or irregular heartbeat, feeling faint or lightheaded, fatigue, swelling of the ankles or feet Stomach pain, bloody diarrhea, pale skin, unusual weakness or fatigue, decrease in  the amount of urine, which may be signs of hemolytic uremic syndrome Sudden and severe headache, confusion, change in vision, seizures, which may be signs of posterior reversible encephalopathy syndrome (PRES) TTP--purple spots on the skin or inside the mouth, pale skin, yellowing skin or eyes, unusual weakness or fatigue, fever, fast or irregular heartbeat, confusion, change in vision, trouble speaking, trouble walking Tumor lysis syndrome (TLS)--nausea, vomiting, diarrhea, decrease in the amount of urine, dark urine, unusual weakness or fatigue, confusion, muscle pain or cramps, fast or irregular heartbeat, joint pain Side effects that usually do not require medical attention (report to  your care team if they continue or are bothersome): Diarrhea Fatigue Nausea Trouble sleeping This list may not describe all possible side effects. Call your doctor for medical advice about side effects. You may report side effects to FDA at 1-800-FDA-1088. Where should I keep my medication? This medication is given in a hospital or clinic. It will not be stored at home. NOTE: This sheet is a summary. It may not cover all possible information. If you have questions about this medicine, talk to your doctor, pharmacist, or health care provider.  2023 Elsevier/Gold Standard (2022-02-01 00:00:00)

## 2022-07-12 NOTE — Progress Notes (Signed)
Patient seen by [name] NP today  Vitals are within treatment parameters.  Labs reviewed by [name] NP and are not all within treatment parameters. Plts is 81. Per Lattie Haw is okay to treat.  Per physician team, patient is ready for treatment and there are NO modifications to the treatment plan.

## 2022-07-13 ENCOUNTER — Other Ambulatory Visit: Payer: Self-pay

## 2022-07-22 ENCOUNTER — Other Ambulatory Visit: Payer: Self-pay | Admitting: Oncology

## 2022-07-24 ENCOUNTER — Ambulatory Visit: Payer: Medicare Other | Admitting: Internal Medicine

## 2022-07-25 ENCOUNTER — Other Ambulatory Visit: Payer: Self-pay

## 2022-07-26 ENCOUNTER — Inpatient Hospital Stay: Payer: Medicare Other | Attending: Oncology

## 2022-07-26 ENCOUNTER — Inpatient Hospital Stay: Payer: Medicare Other

## 2022-07-26 VITALS — BP 137/60 | HR 67 | Temp 98.7°F | Resp 18 | Ht 69.0 in | Wt 186.1 lb

## 2022-07-26 DIAGNOSIS — C9000 Multiple myeloma not having achieved remission: Secondary | ICD-10-CM | POA: Diagnosis present

## 2022-07-26 DIAGNOSIS — Z5112 Encounter for antineoplastic immunotherapy: Secondary | ICD-10-CM | POA: Insufficient documentation

## 2022-07-26 DIAGNOSIS — Z5111 Encounter for antineoplastic chemotherapy: Secondary | ICD-10-CM | POA: Insufficient documentation

## 2022-07-26 DIAGNOSIS — G620 Drug-induced polyneuropathy: Secondary | ICD-10-CM

## 2022-07-26 DIAGNOSIS — Z9481 Bone marrow transplant status: Secondary | ICD-10-CM

## 2022-07-26 DIAGNOSIS — Z79899 Other long term (current) drug therapy: Secondary | ICD-10-CM | POA: Insufficient documentation

## 2022-07-26 LAB — CMP (CANCER CENTER ONLY)
ALT: 12 U/L (ref 0–44)
AST: 17 U/L (ref 15–41)
Albumin: 4.2 g/dL (ref 3.5–5.0)
Alkaline Phosphatase: 44 U/L (ref 38–126)
Anion gap: 10 (ref 5–15)
BUN: 29 mg/dL — ABNORMAL HIGH (ref 8–23)
CO2: 22 mmol/L (ref 22–32)
Calcium: 10.2 mg/dL (ref 8.9–10.3)
Chloride: 103 mmol/L (ref 98–111)
Creatinine: 1.29 mg/dL — ABNORMAL HIGH (ref 0.61–1.24)
GFR, Estimated: 59 mL/min — ABNORMAL LOW (ref >60)
Glucose, Bld: 88 mg/dL (ref 70–99)
Potassium: 3.9 mmol/L (ref 3.5–5.1)
Sodium: 135 mmol/L (ref 135–145)
Total Bilirubin: 0.4 mg/dL (ref 0.3–1.2)
Total Protein: 7.8 g/dL (ref 6.5–8.1)

## 2022-07-26 LAB — CBC WITH DIFFERENTIAL (CANCER CENTER ONLY)
Abs Immature Granulocytes: 0.02 10*3/uL (ref 0.00–0.07)
Basophils Absolute: 0 10*3/uL (ref 0.0–0.1)
Basophils Relative: 1 %
Eosinophils Absolute: 0.2 10*3/uL (ref 0.0–0.5)
Eosinophils Relative: 4 %
HCT: 27.2 % — ABNORMAL LOW (ref 39.0–52.0)
Hemoglobin: 8.9 g/dL — ABNORMAL LOW (ref 13.0–17.0)
Immature Granulocytes: 1 %
Lymphocytes Relative: 9 %
Lymphs Abs: 0.3 10*3/uL — ABNORMAL LOW (ref 0.7–4.0)
MCH: 34.5 pg — ABNORMAL HIGH (ref 26.0–34.0)
MCHC: 32.7 g/dL (ref 30.0–36.0)
MCV: 105.4 fL — ABNORMAL HIGH (ref 80.0–100.0)
Monocytes Absolute: 0.8 10*3/uL (ref 0.1–1.0)
Monocytes Relative: 22 %
Neutro Abs: 2.4 10*3/uL (ref 1.7–7.7)
Neutrophils Relative %: 63 %
Platelet Count: 115 10*3/uL — ABNORMAL LOW (ref 150–400)
RBC: 2.58 MIL/uL — ABNORMAL LOW (ref 4.22–5.81)
RDW: 15.6 % — ABNORMAL HIGH (ref 11.5–15.5)
WBC Count: 3.7 10*3/uL — ABNORMAL LOW (ref 4.0–10.5)
nRBC: 0 % (ref 0.0–0.2)

## 2022-07-26 MED ORDER — SODIUM CHLORIDE 0.9 % IV SOLN
250.0000 mg/m2 | Freq: Once | INTRAVENOUS | Status: AC
  Administered 2022-07-26: 500 mg via INTRAVENOUS
  Filled 2022-07-26: qty 25

## 2022-07-26 MED ORDER — SODIUM CHLORIDE 0.9 % IV SOLN: Freq: Once | INTRAVENOUS | Status: AC

## 2022-07-26 MED ORDER — DEXAMETHASONE 4 MG PO TABS
10.0000 mg | ORAL_TABLET | Freq: Once | ORAL | Status: AC
  Administered 2022-07-26: 10 mg via ORAL
  Filled 2022-07-26: qty 3

## 2022-07-26 MED ORDER — DEXTROSE 5 % IV SOLN
56.0000 mg/m2 | Freq: Once | INTRAVENOUS | Status: AC
  Administered 2022-07-26: 110 mg via INTRAVENOUS
  Filled 2022-07-26: qty 30

## 2022-07-26 NOTE — Patient Instructions (Signed)
Andre [name]   Discharge Instructions: Thank you for choosing [facility] to provide your oncology and hematology care.   If you have a lab appointment with the [facility], please go directly to the [facility] and check in at the registration area.   Wear comfortable clothing and clothing appropriate for easy access to any Portacath or PICC line.   We strive to give you quality time with your provider. You may need to reschedule your appointment if you arrive late (15 or more minutes).  Arriving late affects you and other patients whose appointments are after yours.  Also, if you miss three or more appointments without notifying the office, you may be dismissed from the clinic at the provider's discretion.      For prescription refill requests, have your pharmacy contact our office and allow 72 hours for refills to be completed.    Today you received the following chemotherapy and/or immunotherapy agents Carfilzomib (KYPROLIS) & Cyclophosphamide (CYTOXAN).      To help prevent nausea and vomiting after your treatment, we encourage you to take your nausea medication as directed.  BELOW ARE SYMPTOMS THAT SHOULD BE REPORTED IMMEDIATELY: *FEVER GREATER THAN 100.4 F (38 C) OR HIGHER *CHILLS OR SWEATING *NAUSEA AND VOMITING THAT IS NOT CONTROLLED WITH YOUR NAUSEA MEDICATION *UNUSUAL SHORTNESS OF BREATH *UNUSUAL BRUISING OR BLEEDING *URINARY PROBLEMS (pain or burning when urinating, or frequent urination) *BOWEL PROBLEMS (unusual diarrhea, constipation, pain near the anus) TENDERNESS IN MOUTH AND THROAT WITH OR WITHOUT PRESENCE OF ULCERS (sore throat, sores in mouth, or a toothache) UNUSUAL RASH, SWELLING OR PAIN  UNUSUAL VAGINAL DISCHARGE OR ITCHING   Items with * indicate a potential emergency and should be followed up as soon as possible or go to the Emergency Department if any problems should occur.  Please show the CHEMOTHERAPY ALERT CARD or  IMMUNOTHERAPY ALERT CARD at check-in to the Emergency Department and triage nurse.  Should you have questions after your visit or need to cancel or reschedule your appointment, please contact [facility]  Dept: [phone]  and follow the prompts.  Office hours are 8:00 a.m. to 4:30 p.m. Monday - Friday. Please note that voicemails left after 4:00 p.m. may not be returned until the following business day.  We are closed weekends and major holidays. You have access to a nurse at all times for urgent questions. Please call the main number to the clinic Dept: [phone] and follow the prompts.   For any non-urgent questions, you may also contact your provider using MyChart. We now offer e-Visits for anyone 55 and older to request care online for non-urgent symptoms. For details visit [email or web address].   Also download the MyChart app! Go to the app store, search "MyChart", open the app, select [facility], and log in with your MyChart username and password.  Masks are optional in the cancer centers. If you would like for your care team to wear a mask while they are taking care of you, please let them know. You may have one support person who is at least 73 years old accompany you for your appointments.  Cyclophosphamide Injection What is this medication? CYCLOPHOSPHAMIDE (sye kloe FOSS fa mide) treats some types of cancer. It works by slowing down the growth of cancer cells. This medicine may be used for other purposes; ask your health care provider or pharmacist if you have questions. COMMON BRAND NAME(S): Cyclophosphamide, Cytoxan, Neosar What should I tell  my care team before I take this medication? They need to know if you have any of these conditions: Heart disease Irregular heartbeat or rhythm Infection Kidney problems Liver disease Low blood cell levels (white cells, platelets, or red blood cells) Lung disease Previous radiation Trouble passing  urine An unusual or allergic reaction to cyclophosphamide, other medications, foods, dyes, or preservatives Pregnant or trying to get pregnant Breast-feeding How should I use this medication? This medication is injected into a vein. It is given by your care team in a hospital or clinic setting. Talk to your care team about the use of this medication in children. Special care may be needed. Overdosage: If you think you have taken too much of this medicine contact a poison control center or emergency room at once. NOTE: This medicine is only for you. Do not share this medicine with others. What if I miss a dose? Keep appointments for follow-up doses. It is important not to miss your dose. Call your care team if you are unable to keep an appointment. What may interact with this medication? Amphotericin Isidor Amiodarone Azathioprine Certain antivirals for HIV or hepatitis Certain medications for blood pressure, such as enalapril, lisinopril, quinapril Cyclosporine Diuretics Etanercept Indomethacin Medications that relax muscles Metronidazole Natalizumab Tamoxifen Warfarin This list may not describe all possible interactions. Give your health care provider a list of all the medicines, herbs, non-prescription drugs, or dietary supplements you use. Also tell them if you smoke, drink alcohol, or use illegal drugs. Some items may interact with your medicine. What should I watch for while using this medication? This medication may make you feel generally unwell. This is not uncommon as chemotherapy can affect healthy cells as well as cancer cells. Report any side effects. Continue your course of treatment even though you feel ill unless your care team tells you to stop. You may need blood work while you are taking this medication. This medication may increase your risk of getting an infection. Call your care team for advice if you get a fever, chills, sore throat, or other symptoms of a cold or flu.  Do not treat yourself. Try to avoid being around people who are sick. Avoid taking medications that contain aspirin, acetaminophen, ibuprofen, naproxen, or ketoprofen unless instructed by your care team. These medications may hide a fever. Be careful brushing or flossing your teeth or using a toothpick because you may get an infection or bleed more easily. If you have any dental work done, tell your dentist you are receiving this medication. Drink water or other fluids as directed. Urinate often, even at night. Some products may contain alcohol. Ask your care team if this medication contains alcohol. Be sure to tell all care teams you are taking this medicine. Certain medicines, like metronidazole and disulfiram, can cause an unpleasant reaction when taken with alcohol. The reaction includes flushing, headache, nausea, vomiting, sweating, and increased thirst. The reaction can last from 30 minutes to several hours. Talk to your care team if you wish to become pregnant or think you might be pregnant. This medication can cause serious birth defects if taken during pregnancy and for 1 year after the last dose. A negative pregnancy test is required before starting this medication. A reliable form of contraception is recommended while taking this medication and for 1 year after the last dose. Talk to your care team about reliable forms of contraception. Do not father a child while taking this medication and for 4 months after the last dose.  Use a condom during this time period. Do not breast-feed while taking this medication or for 1 week after the last dose. This medication may cause infertility. Talk to your care team if you are concerned about your fertility. Talk to your care team about your risk of cancer. You may be more at risk for certain types of cancer if you take this medication. What side effects may I notice from receiving this medication? Side effects that you should report to your care team as  soon as possible: Allergic reactions--skin rash, itching, hives, swelling of the face, lips, tongue, or throat Dry cough, shortness of breath or trouble breathing Heart failure--shortness of breath, swelling of the ankles, feet, or hands, sudden weight gain, unusual weakness or fatigue Heart muscle inflammation--unusual weakness or fatigue, shortness of breath, chest pain, fast or irregular heartbeat, dizziness, swelling of the ankles, feet, or hands Heart rhythm changes--fast or irregular heartbeat, dizziness, feeling faint or lightheaded, chest pain, trouble breathing Infection--fever, chills, cough, sore throat, wounds that don't heal, pain or trouble when passing urine, general feeling of discomfort or being unwell Kidney injury--decrease in the amount of urine, swelling of the ankles, hands, or feet Liver injury--right upper belly pain, loss of appetite, nausea, light-colored stool, dark yellow or brown urine, yellowing skin or eyes, unusual weakness or fatigue Low red blood cell level--unusual weakness or fatigue, dizziness, headache, trouble breathing Low sodium level--muscle weakness, fatigue, dizziness, headache, confusion Red or dark brown urine Unusual bruising or bleeding Side effects that usually do not require medical attention (report to your care team if they continue or are bothersome): Hair loss Irregular menstrual cycles or spotting Loss of appetite Nausea Pain, redness, or swelling with sores inside the mouth or throat Vomiting This list may not describe all possible side effects. Call your doctor for medical advice about side effects. You may report side effects to FDA at 1-800-FDA-1088. Where should I keep my medication? This medication is given in a hospital or clinic. It will not be stored at home. NOTE: This sheet is a summary. It may not cover all possible information. If you have questions about this medicine, talk to your doctor, pharmacist, or health care  provider.  2023 Elsevier/Gold Standard (2021-10-25 00:00:00)  Carfilzomib Injection What is this medication? CARFILZOMIB (kar FILZ oh mib) treats multiple myeloma, a type of bone marrow cancer. It works by blocking a protein that causes cancer cells to grow and multiply. This helps to slow or stop the spread of cancer cells. This medicine may be used for other purposes; ask your health care provider or pharmacist if you have questions. COMMON BRAND NAME(S): KYPROLIS What should I tell my care team before I take this medication? They need to know if you have any of these conditions: Heart disease History of blood clots Irregular heartbeat Kidney disease Liver disease Lung or breathing disease An unusual or allergic reaction to carfilzomib, or other medications, foods, dyes, or preservatives If you or your partner are pregnant or trying to get pregnant Breastfeeding How should I use this medication? This medication is injected into a vein. It is given by your care team in a hospital or clinic setting. Talk to your care team about the use of this medication in children. Special care may be needed. Overdosage: If you think you have taken too much of this medicine contact a poison control center or emergency room at once. NOTE: This medicine is only for you. Do not share this medicine with others. What  if I miss a dose? Keep appointments for follow-up doses. It is important not to miss your dose. Call your care team if you are unable to keep an appointment. What may interact with this medication? Interactions are not expected. This list may not describe all possible interactions. Give your health care provider a list of all the medicines, herbs, non-prescription drugs, or dietary supplements you use. Also tell them if you smoke, drink alcohol, or use illegal drugs. Some items may interact with your medicine. What should I watch for while using this medication? Your condition will be  monitored carefully while you are receiving this medication. You may need blood work while taking this medication. Check with your care team if you have severe diarrhea, nausea, and vomiting, or if you sweat a lot. The loss of too much body fluid may make it dangerous for you to take this medication. This medication may affect your coordination, reaction time, or judgment. Do not drive or operate machinery until you know how this medication affects you. Sit up or stand slowly to reduce the risk of dizzy or fainting spells. Drinking alcohol with this medication can increase the risk of these side effects. Talk to your care team if you may be pregnant. Serious birth defects can occur if you take this medication during pregnancy and for 6 months after the last dose. You will need a negative pregnancy test before starting this medication. Contraception is recommended while taking this medication and for 6 months after the last dose. Your care team can help you find an option that works for you. If your partner can get pregnant, use a condom during sex while taking this medication and for 3 months after the last dose. Do not breastfeed while taking this medication and for 2 weeks after the last dose. This medication may cause infertility. Talk to your care team if you are concerned about your fertility. What side effects may I notice from receiving this medication? Side effects that you should report to your care team as soon as possible: Allergic reactions--skin rash, itching, hives, swelling of the face, lips, tongue, or throat Bleeding--bloody or black, tar-like stools, vomiting blood or brown material that looks like coffee grounds, red or dark brown urine, small red or purple spots on skin, unusual bruising or bleeding Blood clot--pain, swelling, or warmth in the leg, shortness of breath, chest pain Dizziness, loss of balance or coordination, confusion or trouble speaking Heart attack--pain or tightness  in the chest, shoulders, arms, or jaw, nausea, shortness of breath, cold or clammy skin, feeling faint or lightheaded Heart failure--shortness of breath, swelling of the ankles, feet, or hands, sudden weight gain, unusual weakness or fatigue Heart rhythm changes--fast or irregular heartbeat, dizziness, feeling faint or lightheaded, chest pain, trouble breathing Increase in blood pressure Infection--fever, chills, cough, sore throat, wounds that don't heal, pain or trouble when passing urine, general feeling of discomfort or being unwell Infusion reactions--chest pain, shortness of breath or trouble breathing, feeling faint or lightheaded Kidney injury--decrease in the amount of urine, swelling of the ankles, hands, or feet Liver injury--right upper belly pain, loss of appetite, nausea, light-colored stool, dark yellow or brown urine, yellowing skin or eyes, unusual weakness or fatigue Lung injury--shortness of breath or trouble breathing, cough, spitting up blood, chest pain, fever Pulmonary hypertension--shortness of breath, chest pain, fast or irregular heartbeat, feeling faint or lightheaded, fatigue, swelling of the ankles or feet Stomach pain, bloody diarrhea, pale skin, unusual weakness or fatigue, decrease in  the amount of urine, which may be signs of hemolytic uremic syndrome Sudden and severe headache, confusion, change in vision, seizures, which may be signs of posterior reversible encephalopathy syndrome (PRES) TTP--purple spots on the skin or inside the mouth, pale skin, yellowing skin or eyes, unusual weakness or fatigue, fever, fast or irregular heartbeat, confusion, change in vision, trouble speaking, trouble walking Tumor lysis syndrome (TLS)--nausea, vomiting, diarrhea, decrease in the amount of urine, dark urine, unusual weakness or fatigue, confusion, muscle pain or cramps, fast or irregular heartbeat, joint pain Side effects that usually do not require medical attention (report to  your care team if they continue or are bothersome): Diarrhea Fatigue Nausea Trouble sleeping This list may not describe all possible side effects. Call your doctor for medical advice about side effects. You may report side effects to FDA at 1-800-FDA-1088. Where should I keep my medication? This medication is given in a hospital or clinic. It will not be stored at home. NOTE: This sheet is a summary. It may not cover all possible information. If you have questions about this medicine, talk to your doctor, pharmacist, or health care provider.  2023 Elsevier/Gold Standard (2022-02-01 00:00:00)

## 2022-07-27 LAB — KAPPA/LAMBDA LIGHT CHAINS
Kappa free light chain: 7.1 mg/L (ref 3.3–19.4)
Kappa, lambda light chain ratio: 0.04 — ABNORMAL LOW (ref 0.26–1.65)
Lambda free light chains: 191 mg/L — ABNORMAL HIGH (ref 5.7–26.3)

## 2022-07-28 LAB — IGG: IgG (Immunoglobin G), Serum: 1580 mg/dL (ref 603–1613)

## 2022-07-30 ENCOUNTER — Other Ambulatory Visit: Payer: Self-pay | Admitting: Oncology

## 2022-07-30 DIAGNOSIS — C9000 Multiple myeloma not having achieved remission: Secondary | ICD-10-CM

## 2022-07-30 DIAGNOSIS — G620 Drug-induced polyneuropathy: Secondary | ICD-10-CM

## 2022-07-30 DIAGNOSIS — Z9481 Bone marrow transplant status: Secondary | ICD-10-CM

## 2022-07-31 LAB — PROTEIN ELECTROPHORESIS, SERUM
A/G Ratio: 1.2 (ref 0.7–1.7)
Albumin ELP: 3.8 g/dL (ref 2.9–4.4)
Alpha-1-Globulin: 0.2 g/dL (ref 0.0–0.4)
Alpha-2-Globulin: 0.7 g/dL (ref 0.4–1.0)
Beta Globulin: 1 g/dL (ref 0.7–1.3)
Gamma Globulin: 1.3 g/dL (ref 0.4–1.8)
Globulin, Total: 3.3 g/dL (ref 2.2–3.9)
M-Spike, %: 1.1 g/dL — ABNORMAL HIGH
Total Protein ELP: 7.1 g/dL (ref 6.0–8.5)

## 2022-08-02 ENCOUNTER — Inpatient Hospital Stay: Payer: Medicare Other

## 2022-08-02 ENCOUNTER — Telehealth: Payer: Self-pay | Admitting: *Deleted

## 2022-08-02 DIAGNOSIS — G620 Drug-induced polyneuropathy: Secondary | ICD-10-CM

## 2022-08-02 DIAGNOSIS — Z9481 Bone marrow transplant status: Secondary | ICD-10-CM

## 2022-08-02 DIAGNOSIS — Z5112 Encounter for antineoplastic immunotherapy: Secondary | ICD-10-CM | POA: Diagnosis not present

## 2022-08-02 DIAGNOSIS — C9000 Multiple myeloma not having achieved remission: Secondary | ICD-10-CM

## 2022-08-02 LAB — CBC WITH DIFFERENTIAL (CANCER CENTER ONLY)
Abs Immature Granulocytes: 0.01 10*3/uL (ref 0.00–0.07)
Basophils Absolute: 0 10*3/uL (ref 0.0–0.1)
Basophils Relative: 0 %
Eosinophils Absolute: 0.2 10*3/uL (ref 0.0–0.5)
Eosinophils Relative: 4 %
HCT: 28.6 % — ABNORMAL LOW (ref 39.0–52.0)
Hemoglobin: 9.2 g/dL — ABNORMAL LOW (ref 13.0–17.0)
Immature Granulocytes: 0 %
Lymphocytes Relative: 6 %
Lymphs Abs: 0.3 10*3/uL — ABNORMAL LOW (ref 0.7–4.0)
MCH: 33.8 pg (ref 26.0–34.0)
MCHC: 32.2 g/dL (ref 30.0–36.0)
MCV: 105.1 fL — ABNORMAL HIGH (ref 80.0–100.0)
Monocytes Absolute: 1 10*3/uL (ref 0.1–1.0)
Monocytes Relative: 23 %
Neutro Abs: 2.7 10*3/uL (ref 1.7–7.7)
Neutrophils Relative %: 67 %
Platelet Count: 78 10*3/uL — ABNORMAL LOW (ref 150–400)
RBC: 2.72 MIL/uL — ABNORMAL LOW (ref 4.22–5.81)
RDW: 15.9 % — ABNORMAL HIGH (ref 11.5–15.5)
WBC Count: 4.1 10*3/uL (ref 4.0–10.5)
nRBC: 0.5 % — ABNORMAL HIGH (ref 0.0–0.2)

## 2022-08-02 LAB — CMP (CANCER CENTER ONLY)
ALT: 11 U/L (ref 0–44)
AST: 15 U/L (ref 15–41)
Albumin: 4.4 g/dL (ref 3.5–5.0)
Alkaline Phosphatase: 49 U/L (ref 38–126)
Anion gap: 10 (ref 5–15)
BUN: 31 mg/dL — ABNORMAL HIGH (ref 8–23)
CO2: 24 mmol/L (ref 22–32)
Calcium: 10.6 mg/dL — ABNORMAL HIGH (ref 8.9–10.3)
Chloride: 102 mmol/L (ref 98–111)
Creatinine: 1.16 mg/dL (ref 0.61–1.24)
GFR, Estimated: >60 mL/min (ref >60)
Glucose, Bld: 85 mg/dL (ref 70–99)
Potassium: 4.7 mmol/L (ref 3.5–5.1)
Sodium: 136 mmol/L (ref 135–145)
Total Bilirubin: 0.5 mg/dL (ref 0.3–1.2)
Total Protein: 8.2 g/dL — ABNORMAL HIGH (ref 6.5–8.1)

## 2022-08-02 NOTE — Progress Notes (Signed)
Patient was not treated today due to poor PIV Access and unsuccessful multiple sticks. He declined coming back next week for treatment because of the Thanksgiving holidays. Patient is scheduled to return for Lab/MD Visit/Infusion on 08/18/2022. Dr. Benay Spice made aware and he is agreeable with this plan.

## 2022-08-02 NOTE — Telephone Encounter (Signed)
Called patient to discuss option of having PICC line placed on 11/30 before next treatment due to poor venous access. Discussed insertion procedure and maintenance. He will discuss w/wife and call back with his decision. Planning the CAR-T early January.

## 2022-08-02 NOTE — Progress Notes (Signed)
Per Dr. Benay Spice: OK to treat w/platelets 78,000 today.

## 2022-08-09 ENCOUNTER — Inpatient Hospital Stay: Payer: Medicare Other

## 2022-08-09 ENCOUNTER — Inpatient Hospital Stay: Payer: Medicare Other | Admitting: Oncology

## 2022-08-10 ENCOUNTER — Other Ambulatory Visit: Payer: Self-pay

## 2022-08-14 ENCOUNTER — Other Ambulatory Visit (HOSPITAL_BASED_OUTPATIENT_CLINIC_OR_DEPARTMENT_OTHER): Payer: Self-pay

## 2022-08-14 ENCOUNTER — Other Ambulatory Visit: Payer: Self-pay | Admitting: Internal Medicine

## 2022-08-14 MED ORDER — CALCIUM CARBONATE-VITAMIN D 500-200 MG-UNIT PO TABS
1.0000 | ORAL_TABLET | Freq: Every day | ORAL | 3 refills | Status: DC
  Filled 2022-08-14: qty 90, 90d supply, fill #0

## 2022-08-15 ENCOUNTER — Other Ambulatory Visit (HOSPITAL_BASED_OUTPATIENT_CLINIC_OR_DEPARTMENT_OTHER): Payer: Self-pay

## 2022-08-16 ENCOUNTER — Other Ambulatory Visit (HOSPITAL_BASED_OUTPATIENT_CLINIC_OR_DEPARTMENT_OTHER): Payer: Self-pay

## 2022-08-16 MED ORDER — TRIAMCINOLONE ACETONIDE 0.1 % EX OINT
1.0000 | TOPICAL_OINTMENT | Freq: Two times a day (BID) | CUTANEOUS | 2 refills | Status: DC
  Filled 2022-08-16: qty 80, 30d supply, fill #0

## 2022-08-16 MED ORDER — LORAZEPAM 0.5 MG PO TABS
0.5000 mg | ORAL_TABLET | Freq: Every evening | ORAL | 1 refills | Status: DC | PRN
  Filled 2022-08-16: qty 60, 60d supply, fill #0

## 2022-08-18 ENCOUNTER — Inpatient Hospital Stay (HOSPITAL_BASED_OUTPATIENT_CLINIC_OR_DEPARTMENT_OTHER): Payer: Medicare Other | Admitting: Nurse Practitioner

## 2022-08-18 ENCOUNTER — Inpatient Hospital Stay: Payer: Medicare Other | Attending: Oncology

## 2022-08-18 ENCOUNTER — Encounter: Payer: Self-pay | Admitting: Nurse Practitioner

## 2022-08-18 ENCOUNTER — Inpatient Hospital Stay: Payer: Medicare Other

## 2022-08-18 DIAGNOSIS — Z9481 Bone marrow transplant status: Secondary | ICD-10-CM | POA: Diagnosis not present

## 2022-08-18 DIAGNOSIS — C9000 Multiple myeloma not having achieved remission: Secondary | ICD-10-CM

## 2022-08-18 DIAGNOSIS — Z5112 Encounter for antineoplastic immunotherapy: Secondary | ICD-10-CM | POA: Insufficient documentation

## 2022-08-18 DIAGNOSIS — T451X5A Adverse effect of antineoplastic and immunosuppressive drugs, initial encounter: Secondary | ICD-10-CM | POA: Diagnosis not present

## 2022-08-18 DIAGNOSIS — Z79899 Other long term (current) drug therapy: Secondary | ICD-10-CM | POA: Diagnosis not present

## 2022-08-18 DIAGNOSIS — Z5111 Encounter for antineoplastic chemotherapy: Secondary | ICD-10-CM | POA: Diagnosis present

## 2022-08-18 DIAGNOSIS — G620 Drug-induced polyneuropathy: Secondary | ICD-10-CM | POA: Diagnosis not present

## 2022-08-18 LAB — CBC WITH DIFFERENTIAL (CANCER CENTER ONLY)
Abs Immature Granulocytes: 0.01 10*3/uL (ref 0.00–0.07)
Basophils Absolute: 0 10*3/uL (ref 0.0–0.1)
Basophils Relative: 0 %
Eosinophils Absolute: 0.2 10*3/uL (ref 0.0–0.5)
Eosinophils Relative: 6 %
HCT: 29.1 % — ABNORMAL LOW (ref 39.0–52.0)
Hemoglobin: 9.3 g/dL — ABNORMAL LOW (ref 13.0–17.0)
Immature Granulocytes: 0 %
Lymphocytes Relative: 11 %
Lymphs Abs: 0.4 10*3/uL — ABNORMAL LOW (ref 0.7–4.0)
MCH: 32.6 pg (ref 26.0–34.0)
MCHC: 32 g/dL (ref 30.0–36.0)
MCV: 102.1 fL — ABNORMAL HIGH (ref 80.0–100.0)
Monocytes Absolute: 0.7 10*3/uL (ref 0.1–1.0)
Monocytes Relative: 20 %
Neutro Abs: 2.1 10*3/uL (ref 1.7–7.7)
Neutrophils Relative %: 63 %
Platelet Count: 103 10*3/uL — ABNORMAL LOW (ref 150–400)
RBC: 2.85 MIL/uL — ABNORMAL LOW (ref 4.22–5.81)
RDW: 15.8 % — ABNORMAL HIGH (ref 11.5–15.5)
WBC Count: 3.4 10*3/uL — ABNORMAL LOW (ref 4.0–10.5)
nRBC: 0 % (ref 0.0–0.2)

## 2022-08-18 LAB — CMP (CANCER CENTER ONLY)
ALT: 13 U/L (ref 0–44)
AST: 17 U/L (ref 15–41)
Albumin: 4.2 g/dL (ref 3.5–5.0)
Alkaline Phosphatase: 49 U/L (ref 38–126)
Anion gap: 8 (ref 5–15)
BUN: 30 mg/dL — ABNORMAL HIGH (ref 8–23)
CO2: 22 mmol/L (ref 22–32)
Calcium: 10 mg/dL (ref 8.9–10.3)
Chloride: 105 mmol/L (ref 98–111)
Creatinine: 1.15 mg/dL (ref 0.61–1.24)
GFR, Estimated: >60 mL/min (ref >60)
Glucose, Bld: 88 mg/dL (ref 70–99)
Potassium: 4 mmol/L (ref 3.5–5.1)
Sodium: 135 mmol/L (ref 135–145)
Total Bilirubin: 0.4 mg/dL (ref 0.3–1.2)
Total Protein: 7.7 g/dL (ref 6.5–8.1)

## 2022-08-18 MED ORDER — SODIUM CHLORIDE 0.9 % IV SOLN: Freq: Once | INTRAVENOUS | Status: DC

## 2022-08-18 MED ORDER — DEXTROSE 5 % IV SOLN
56.0000 mg/m2 | Freq: Once | INTRAVENOUS | Status: AC
  Administered 2022-08-18: 110 mg via INTRAVENOUS
  Filled 2022-08-18: qty 30

## 2022-08-18 MED ORDER — DEXAMETHASONE 4 MG PO TABS
10.0000 mg | ORAL_TABLET | Freq: Once | ORAL | Status: AC
  Administered 2022-08-18: 10 mg via ORAL
  Filled 2022-08-18: qty 3

## 2022-08-18 MED ORDER — SODIUM CHLORIDE 0.9 % IV SOLN
250.0000 mg/m2 | Freq: Once | INTRAVENOUS | Status: AC
  Administered 2022-08-18: 500 mg via INTRAVENOUS
  Filled 2022-08-18: qty 25

## 2022-08-18 MED ORDER — SODIUM CHLORIDE 0.9 % IV SOLN: Freq: Once | INTRAVENOUS | Status: AC

## 2022-08-18 MED ORDER — ZOLEDRONIC ACID 4 MG/100ML IV SOLN
4.0000 mg | Freq: Once | INTRAVENOUS | Status: AC
  Administered 2022-08-18: 4 mg via INTRAVENOUS
  Filled 2022-08-18: qty 100

## 2022-08-18 NOTE — Progress Notes (Signed)
Patient presents for treatment. RN assessment completed along with the following:  Labs/vitals reviewed - Yes, and within treatment parameters.   Weight within 10% of previous measurement - Yes Informed consent completed and reflects current therapy/intent - Yes, on date 12/19/2021             Provider progress note reviewed - Today's provider note is not yet available. I reviewed the most recent oncology provider progress note in chart dated 08/18/22. Treatment/Antibody/Supportive plan reviewed - Yes, and there are no adjustments needed for today's treatment. S&H and other orders reviewed - Yes, and there are no additional orders identified. Previous treatment date reviewed - Yes, and the appropriate amount of time has elapsed between treatments. Clinic Hand Off Received from - Leander Rams, NP  Patient to proceed with treatment.

## 2022-08-18 NOTE — Progress Notes (Signed)
[facility] OFFICE PROGRESS NOTE   Diagnosis: Multiple myeloma  INTERVAL HISTORY:   Mr. Cline returns as scheduled.  He completed treatment with Carfilzomib/Cytoxan/Decadron on 07/26/2022.  He saw Dr. Melba Coon 07/31/2022.  Recommendation to continue Carfilzomib, cyclophosphamide; stop 2 weeks prior to fludarabine/cyclophosphamide (09/06/2022).  Mr. Clayburn Pert reports overall he feels well.  He did have some trouble sleeping last night.  No nausea or vomiting.  No mouth sores.  No diarrhea.  No bleeding except occasional blood with nose blowing.  No fever.  Objective:  Vital signs in last 24 hours:  Blood pressure 136/71, pulse 75, temperature 98.1 F (36.7 C), temperature source Oral, resp. rate 18, height _0  (1.753 m), weight 185 lb 6.4 oz (84.1 kg), SpO2 100 %.    HEENT: No thrush or ulcers. Resp: Lungs clear bilaterally. Cardio: Regular rate and rhythm. GI: Abdomen soft and nontender.  No hepatosplenomegaly. Vascular: Trace bilateral ankle edema. Neuro: Alert and oriented. Skin: No rash.   Lab Results:  Lab Results  Component Value Date   WBC 3.4 (L) 08/18/2022   HGB 9.3 (L) 08/18/2022   HCT 29.1 (L) 08/18/2022   MCV 102.1 (H) 08/18/2022   PLT 103 (L) 08/18/2022   NEUTROABS 2.1 08/18/2022    Imaging:  No results found.  Medications: I have reviewed the patient's current medications.  Assessment/Plan: Multiple myeloma- IgG lambda  bortezomib (subcutaneously), lenalidomide, and dexamethasone, with repeat bone marrow biopsy May of 2012 showing 10% plasmacytosis   (2) high-dose chemotherapy with BCNU and melphalan at [facility], followed by stem cell rescue July of 2012   (3) on zoledronic acid started December of 2012, initially monthly, currently given every 3 months, most recent dose  12/07/2015   (4) low-dose lenalidomide resumed April 2013, interrupted several times.  Resumed again on 02/19/2013, ata dose of 5 mg daily, 21 days on and 7 days off,  later further reduced to 7 days on, 7 days off   (5) CNS symptoms and abnormal brain MRI extensively evaluated by neurology with no definitive diagnosis established   (6) rising M spike noted June 2014 but did not meet criteria for carfilzomib study   (7) on lenalidomide 25 mg daily, 14 days on, 7 days off, starting 04/18/2013, interrupted December 2014 because of rash;             (a) resumed January 2015 at 10 mg/ day at 21 days on/ 7 days off             (Darell) starting 08/18/2014 decreased to 10 mg/ day 14 days on, 7 days off because of cytopenias             (c) as of February 2016 was on 5 mg daily 7 days on 7 days off             (d) lenalidomide discontinued December 2016 with evidence of disease progression   (8) transient global amnesia 05/29/2015, resolved without intervention   (9) starting PVD 10/25/2015 w ASA 325 thromboprophylaxis, valacyclovir prophylaxis, last dose 12/17/2015             (a) pomalidomide 4 mg/d days 1-14             (Coben) bortezomib sQ days 2,5,9,12 of each 21 day cycle             (c) dexamethasome 20 mg two days a week             (d) dexamethasone bortezomib and pomalidomide  discontinued late December 2018 with poor tolerance   (10) metapneumovirus pneumonia April 2017             (a) completing course of steroids and week of bactrim mid April 2017   (11) status post second autologous transplant at [facility] 02/04/2016(preparatory regimen melphalan 200 mg/m)             (a) received twelve-month vaccinations 03/14/2017 (DPT, Haemophilus, Pneumovax 13, polio)             (Antwon) 14 months injections 05/04/2016 include DTaP, Hib conjugate, HepB energex [place] 20 mcg/ml, Prevnar 13             (c) 24 month vaccines due at [facility] June 2019   (12) maintenance therapy started November 2017, consisting of             (a) bortezomib 1.3 mg/M2 every 14 days, first dose 07/27/2016             (Gregor) pomalidomide 1 mg days 1-21 Q28 days, started 07/19/2016             (c)  zolendronate monthly started 07/27/2016 (previously Q 3 months) however patient unable to tolerate, and changed back to q3 months in April, 2018             (d) Bortezomib changed to monthly as of June 2018 because of tolerance issues, however discontinued after 09/14/2017 dose because of a rise in his M spike             (e) pomalidomide held after 10/18/2017 in preparation for possible study at [facility] (venetoclax)--never resumed             (f) with numbers actually improved off treatment, resumed every 2-week bortezomib 12/25/2017             (g) changed to every 3-week bortezomib as of 03/17/2019             (h) briefly on weekly treatments times 22 October 2020 due to increase in M spike             (I) maintenance therapy discontinued with evidence of progression   (13) bortezomib/daratumumab/dexamethasone started 12/20/2020.             (a) Decadron dose dropped to 10 mg day of and day following treatment starting May 2022             (Dondre) day 8 bortezomib omitted beginning with the June cycle per patient preference             (C) treatment changed to monthly daratumumab/Velcade/Decadron beginning 06/27/2021-patient decision (14) weekly Velcade/Cytoxan beginning 11/14/2021 (15) changed to Cytoxan/carfilzomib/Decadron 12/19/2021-treatment placed on hold 05/15/2022, carfilzomib 05/24/2022 and 05/31/2022 16.  Leukocyte a pheresis procedure at [facility] 06/15/2022 17.  Treatment resumed with Cytoxan/carfilzomib/Decadron 06/28/2022  Disposition: Mr. Rossbach appears stable.  Plan to resume treatment today with Cytoxan/Carfilzomib/Decadron.  Per Dr. Philipp Ovens office note 07/31/2022 treatment will stop 09/06/2022.  CBC and chemistry panel reviewed.  Labs adequate to proceed as above.  He will return for an office visit 09/06/2022.    [name] ANP/GNP-BC   08/18/2022  10:00 AM

## 2022-08-18 NOTE — Patient Instructions (Signed)
[facility]  Discharge Instructions: Thank you for choosing [facility] to provide your oncology and hematology care.   If you have a lab appointment with the [facility], please go directly to the [facility] and check in at the registration area.   Wear comfortable clothing and clothing appropriate for easy access to any Portacath or PICC line.   We strive to give you quality time with your provider. You may need to reschedule your appointment if you arrive late (15 or more minutes).  Arriving late affects you and other patients whose appointments are after yours.  Also, if you miss three or more appointments without notifying the office, you may be dismissed from the clinic at the provider's discretion.      For prescription refill requests, have your pharmacy contact our office and allow 72 hours for refills to be completed.    Today you received the following chemotherapy and/or immunotherapy agents Cytoxan and Kyprolis      To help prevent nausea and vomiting after your treatment, we encourage you to take your nausea medication as directed.  BELOW ARE SYMPTOMS THAT SHOULD BE REPORTED IMMEDIATELY: *FEVER GREATER THAN 100.4 F (38 C) OR HIGHER *CHILLS OR SWEATING *NAUSEA AND VOMITING THAT IS NOT CONTROLLED WITH YOUR NAUSEA MEDICATION *UNUSUAL SHORTNESS OF BREATH *UNUSUAL BRUISING OR BLEEDING *URINARY PROBLEMS (pain or burning when urinating, or frequent urination) *BOWEL PROBLEMS (unusual diarrhea, constipation, pain near the anus) TENDERNESS IN MOUTH AND THROAT WITH OR WITHOUT PRESENCE OF ULCERS (sore throat, sores in mouth, or a toothache) UNUSUAL RASH, SWELLING OR PAIN  UNUSUAL VAGINAL DISCHARGE OR ITCHING   Items with * indicate a potential emergency and should be followed up as soon as possible or go to the Emergency Department if any problems should occur.  Please show the CHEMOTHERAPY ALERT CARD or IMMUNOTHERAPY ALERT CARD at  check-in to the Emergency Department and triage nurse.  Should you have questions after your visit or need to cancel or reschedule your appointment, please contact [facility]  Dept: [phone]  and follow the prompts.  Office hours are 8:00 a.m. to 4:30 p.m. Monday - Friday. Please note that voicemails left after 4:00 p.m. may not be returned until the following business day.  We are closed weekends and major holidays. You have access to a nurse at all times for urgent questions. Please call the main number to the clinic Dept: [phone] and follow the prompts.   For any non-urgent questions, you may also contact your provider using MyChart. We now offer e-Visits for anyone 54 and older to request care online for non-urgent symptoms. For details visit [email or web address].   Also download the MyChart app! Go to the app store, search "MyChart", open the app, select [facility], and log in with your MyChart username and password.  Masks are optional in the cancer centers. If you would like for your care team to wear a mask while they are taking care of you, please let them know. You may have one support person who is at least 73 years old accompany you for your appointments.   Zoledronic Acid Injection (Cancer) What is this medication? ZOLEDRONIC ACID (ZOE le dron ik AS id) treats high calcium levels in the blood caused by cancer. It may also be used with chemotherapy to treat weakened bones caused by cancer. It works by slowing down the release of calcium from bones. This lowers calcium levels in your blood.  It also makes your bones stronger and less likely to break (fracture). It belongs to a group of medications called bisphosphonates. This medicine may be used for other purposes; ask your health care provider or pharmacist if you have questions. COMMON BRAND NAME(S): Zometa, Zometa Powder What should I tell my care team before I take this medication? They need  to know if you have any of these conditions: Dehydration Dental disease Kidney disease Liver disease Low levels of calcium in the blood Lung or breathing disease, such as asthma Receiving steroids, such as dexamethasone or prednisone An unusual or allergic reaction to zoledronic acid, other medications, foods, dyes, or preservatives Pregnant or trying to get pregnant Breast-feeding How should I use this medication? This medication is injected into a vein. It is given by your care team in a hospital or clinic setting. Talk to your care team about the use of this medication in children. Special care may be needed. Overdosage: If you think you have taken too much of this medicine contact a poison control center or emergency room at once. NOTE: This medicine is only for you. Do not share this medicine with others. What if I miss a dose? Keep appointments for follow-up doses. It is important not to miss your dose. Call your care team if you are unable to keep an appointment. What may interact with this medication? Certain antibiotics given by injection Diuretics, such as bumetanide, furosemide NSAIDs, medications for pain and inflammation, such as ibuprofen or naproxen Teriparatide Thalidomide This list may not describe all possible interactions. Give your health care provider a list of all the medicines, herbs, non-prescription drugs, or dietary supplements you use. Also tell them if you smoke, drink alcohol, or use illegal drugs. Some items may interact with your medicine. What should I watch for while using this medication? Visit your care team for regular checks on your progress. It may be some time before you see the benefit from this medication. Some people who take this medication have severe bone, joint, or muscle pain. This medication may also increase your risk for jaw problems or a broken thigh bone. Tell your care team right away if you have severe pain in your jaw, bones, joints,  or muscles. Tell you care team if you have any pain that does not go away or that gets worse. Tell your dentist and dental surgeon that you are taking this medication. You should not have major dental surgery while on this medication. See your dentist to have a dental exam and fix any dental problems before starting this medication. Take good care of your teeth while on this medication. Make sure you see your dentist for regular follow-up appointments. You should make sure you get enough calcium and vitamin D while you are taking this medication. Discuss the foods you eat and the vitamins you take with your care team. Check with your care team if you have severe diarrhea, nausea, and vomiting, or if you sweat a lot. The loss of too much body fluid may make it dangerous for you to take this medication. You may need bloodwork while taking this medication. Talk to your care team if you wish to become pregnant or think you might be pregnant. This medication can cause serious birth defects. What side effects may I notice from receiving this medication? Side effects that you should report to your care team as soon as possible: Allergic reactions--skin rash, itching, hives, swelling of the face, lips, tongue, or throat Kidney injury--decrease  in the amount of urine, swelling of the ankles, hands, or feet Low calcium level--muscle pain or cramps, confusion, tingling, or numbness in the hands or feet Osteonecrosis of the jaw--pain, swelling, or redness in the mouth, numbness of the jaw, poor healing after dental work, unusual discharge from the mouth, visible bones in the mouth Severe bone, joint, or muscle pain Side effects that usually do not require medical attention (report to your care team if they continue or are bothersome): Constipation Fatigue Fever Loss of appetite Nausea Stomach pain This list may not describe all possible side effects. Call your doctor for medical advice about side effects.  You may report side effects to FDA at 1-800-FDA-1088. Where should I keep my medication? This medication is given in a hospital or clinic. It will not be stored at home. NOTE: This sheet is a summary. It may not cover all possible information. If you have questions about this medicine, talk to your doctor, pharmacist, or health care provider.  2023 [employer]vier/Gold Standard (2021-10-20 00:00:00)

## 2022-08-18 NOTE — Progress Notes (Signed)
Patient seen by Lisa Thomas NP today  Vitals are within treatment parameters.  Labs reviewed by Lisa Thomas NP and are within treatment parameters.  Per physician team, patient is ready for treatment and there are NO modifications to the treatment plan.     

## 2022-08-20 ENCOUNTER — Other Ambulatory Visit: Payer: Self-pay | Admitting: Oncology

## 2022-08-20 DIAGNOSIS — T451X5A Adverse effect of antineoplastic and immunosuppressive drugs, initial encounter: Secondary | ICD-10-CM

## 2022-08-20 DIAGNOSIS — C9000 Multiple myeloma not having achieved remission: Secondary | ICD-10-CM

## 2022-08-20 DIAGNOSIS — Z9481 Bone marrow transplant status: Secondary | ICD-10-CM

## 2022-08-23 ENCOUNTER — Ambulatory Visit: Payer: Medicare Other

## 2022-08-23 ENCOUNTER — Inpatient Hospital Stay: Payer: Medicare Other

## 2022-08-23 ENCOUNTER — Other Ambulatory Visit: Payer: Medicare Other

## 2022-08-24 ENCOUNTER — Inpatient Hospital Stay: Payer: Medicare Other

## 2022-08-24 DIAGNOSIS — Z5112 Encounter for antineoplastic immunotherapy: Secondary | ICD-10-CM | POA: Diagnosis not present

## 2022-08-24 DIAGNOSIS — C9000 Multiple myeloma not having achieved remission: Secondary | ICD-10-CM

## 2022-08-24 DIAGNOSIS — Z9481 Bone marrow transplant status: Secondary | ICD-10-CM

## 2022-08-24 DIAGNOSIS — G620 Drug-induced polyneuropathy: Secondary | ICD-10-CM

## 2022-08-24 LAB — CBC WITH DIFFERENTIAL (CANCER CENTER ONLY)
Abs Immature Granulocytes: 0.02 10*3/uL (ref 0.00–0.07)
Basophils Absolute: 0 10*3/uL (ref 0.0–0.1)
Basophils Relative: 0 %
Eosinophils Absolute: 0.2 10*3/uL (ref 0.0–0.5)
Eosinophils Relative: 4 %
HCT: 27.7 % — ABNORMAL LOW (ref 39.0–52.0)
Hemoglobin: 8.8 g/dL — ABNORMAL LOW (ref 13.0–17.0)
Immature Granulocytes: 1 %
Lymphocytes Relative: 7 %
Lymphs Abs: 0.3 10*3/uL — ABNORMAL LOW (ref 0.7–4.0)
MCH: 32.6 pg (ref 26.0–34.0)
MCHC: 31.8 g/dL (ref 30.0–36.0)
MCV: 102.6 fL — ABNORMAL HIGH (ref 80.0–100.0)
Monocytes Absolute: 0.8 10*3/uL (ref 0.1–1.0)
Monocytes Relative: 19 %
Neutro Abs: 3 10*3/uL (ref 1.7–7.7)
Neutrophils Relative %: 69 %
Platelet Count: 55 10*3/uL — ABNORMAL LOW (ref 150–400)
RBC: 2.7 MIL/uL — ABNORMAL LOW (ref 4.22–5.81)
RDW: 16.6 % — ABNORMAL HIGH (ref 11.5–15.5)
WBC Count: 4.2 10*3/uL (ref 4.0–10.5)
nRBC: 0.5 % — ABNORMAL HIGH (ref 0.0–0.2)

## 2022-08-24 LAB — CMP (CANCER CENTER ONLY)
ALT: 22 U/L (ref 0–44)
AST: 16 U/L (ref 15–41)
Albumin: 4.2 g/dL (ref 3.5–5.0)
Alkaline Phosphatase: 54 U/L (ref 38–126)
Anion gap: 11 (ref 5–15)
BUN: 31 mg/dL — ABNORMAL HIGH (ref 8–23)
CO2: 22 mmol/L (ref 22–32)
Calcium: 9.8 mg/dL (ref 8.9–10.3)
Chloride: 105 mmol/L (ref 98–111)
Creatinine: 1.09 mg/dL (ref 0.61–1.24)
GFR, Estimated: >60 mL/min (ref >60)
Glucose, Bld: 66 mg/dL — ABNORMAL LOW (ref 70–99)
Potassium: 4.1 mmol/L (ref 3.5–5.1)
Sodium: 138 mmol/L (ref 135–145)
Total Bilirubin: 0.5 mg/dL (ref 0.3–1.2)
Total Protein: 7.8 g/dL (ref 6.5–8.1)

## 2022-08-24 NOTE — Progress Notes (Unsigned)
Platelet count 55,000 today--hold tx this week per Dr. Benay Spice.

## 2022-08-27 ENCOUNTER — Other Ambulatory Visit: Payer: Self-pay | Admitting: Oncology

## 2022-08-27 DIAGNOSIS — Z9481 Bone marrow transplant status: Secondary | ICD-10-CM

## 2022-08-27 DIAGNOSIS — T451X5A Adverse effect of antineoplastic and immunosuppressive drugs, initial encounter: Secondary | ICD-10-CM

## 2022-08-27 DIAGNOSIS — C9000 Multiple myeloma not having achieved remission: Secondary | ICD-10-CM

## 2022-08-30 ENCOUNTER — Other Ambulatory Visit: Payer: Self-pay | Admitting: Nurse Practitioner

## 2022-08-30 ENCOUNTER — Inpatient Hospital Stay: Payer: Medicare Other

## 2022-08-30 VITALS — BP 131/67 | HR 67 | Temp 98.5°F | Resp 20 | Ht 69.0 in | Wt 182.4 lb

## 2022-08-30 DIAGNOSIS — G620 Drug-induced polyneuropathy: Secondary | ICD-10-CM

## 2022-08-30 DIAGNOSIS — Z5112 Encounter for antineoplastic immunotherapy: Secondary | ICD-10-CM | POA: Diagnosis not present

## 2022-08-30 DIAGNOSIS — Z9481 Bone marrow transplant status: Secondary | ICD-10-CM

## 2022-08-30 DIAGNOSIS — C9000 Multiple myeloma not having achieved remission: Secondary | ICD-10-CM

## 2022-08-30 LAB — CBC WITH DIFFERENTIAL (CANCER CENTER ONLY)
Abs Immature Granulocytes: 0.01 10*3/uL (ref 0.00–0.07)
Basophils Absolute: 0 10*3/uL (ref 0.0–0.1)
Basophils Relative: 1 %
Eosinophils Absolute: 0.1 10*3/uL (ref 0.0–0.5)
Eosinophils Relative: 4 %
HCT: 29.2 % — ABNORMAL LOW (ref 39.0–52.0)
Hemoglobin: 9.2 g/dL — ABNORMAL LOW (ref 13.0–17.0)
Immature Granulocytes: 0 %
Lymphocytes Relative: 9 %
Lymphs Abs: 0.3 10*3/uL — ABNORMAL LOW (ref 0.7–4.0)
MCH: 31.8 pg (ref 26.0–34.0)
MCHC: 31.5 g/dL (ref 30.0–36.0)
MCV: 101 fL — ABNORMAL HIGH (ref 80.0–100.0)
Monocytes Absolute: 0.7 10*3/uL (ref 0.1–1.0)
Monocytes Relative: 23 %
Neutro Abs: 2 10*3/uL (ref 1.7–7.7)
Neutrophils Relative %: 63 %
Platelet Count: 95 10*3/uL — ABNORMAL LOW (ref 150–400)
RBC: 2.89 MIL/uL — ABNORMAL LOW (ref 4.22–5.81)
RDW: 16.6 % — ABNORMAL HIGH (ref 11.5–15.5)
WBC Count: 3.2 10*3/uL — ABNORMAL LOW (ref 4.0–10.5)
nRBC: 0 % (ref 0.0–0.2)

## 2022-08-30 LAB — CMP (CANCER CENTER ONLY)
ALT: 14 U/L (ref 0–44)
AST: 18 U/L (ref 15–41)
Albumin: 4.5 g/dL (ref 3.5–5.0)
Alkaline Phosphatase: 59 U/L (ref 38–126)
Anion gap: 9 (ref 5–15)
BUN: 26 mg/dL — ABNORMAL HIGH (ref 8–23)
CO2: 22 mmol/L (ref 22–32)
Calcium: 10.1 mg/dL (ref 8.9–10.3)
Chloride: 104 mmol/L (ref 98–111)
Creatinine: 1.14 mg/dL (ref 0.61–1.24)
GFR, Estimated: >60 mL/min (ref >60)
Glucose, Bld: 75 mg/dL (ref 70–99)
Potassium: 4 mmol/L (ref 3.5–5.1)
Sodium: 135 mmol/L (ref 135–145)
Total Bilirubin: 0.6 mg/dL (ref 0.3–1.2)
Total Protein: 8 g/dL (ref 6.5–8.1)

## 2022-08-30 MED ORDER — SODIUM CHLORIDE 0.9 % IV SOLN: Freq: Once | INTRAVENOUS | Status: AC

## 2022-08-30 MED ORDER — DEXTROSE 5 % IV SOLN
56.0000 mg/m2 | Freq: Once | INTRAVENOUS | Status: AC
  Administered 2022-08-30: 110 mg via INTRAVENOUS
  Filled 2022-08-30: qty 15

## 2022-08-30 MED ORDER — DEXAMETHASONE 4 MG PO TABS
10.0000 mg | ORAL_TABLET | Freq: Once | ORAL | Status: AC
  Administered 2022-08-30: 10 mg via ORAL
  Filled 2022-08-30: qty 3

## 2022-08-30 MED ORDER — SODIUM CHLORIDE 0.9 % IV SOLN
250.0000 mg/m2 | Freq: Once | INTRAVENOUS | Status: AC
  Administered 2022-08-30: 500 mg via INTRAVENOUS
  Filled 2022-08-30: qty 25

## 2022-08-30 NOTE — Patient Instructions (Signed)
[facility]   Discharge Instructions: Thank you for choosing [facility] to provide your oncology and hematology care.   If you have a lab appointment with the [facility], please go directly to the [facility] and check in at the registration area.   Wear comfortable clothing and clothing appropriate for easy access to any Portacath or PICC line.   We strive to give you quality time with your provider. You may need to reschedule your appointment if you arrive late (15 or more minutes).  Arriving late affects you and other patients whose appointments are after yours.  Also, if you miss three or more appointments without notifying the office, you may be dismissed from the clinic at the provider's discretion.      For prescription refill requests, have your pharmacy contact our office and allow 72 hours for refills to be completed.    Today you received the following chemotherapy and/or immunotherapy agents Carfilzomib (KYPROLIS) & Cyclophosphamide (CYTOXAN).      To help prevent nausea and vomiting after your treatment, we encourage you to take your nausea medication as directed.  BELOW ARE SYMPTOMS THAT SHOULD BE REPORTED IMMEDIATELY: *FEVER GREATER THAN 100.4 F (38 C) OR HIGHER *CHILLS OR SWEATING *NAUSEA AND VOMITING THAT IS NOT CONTROLLED WITH YOUR NAUSEA MEDICATION *UNUSUAL SHORTNESS OF BREATH *UNUSUAL BRUISING OR BLEEDING *URINARY PROBLEMS (pain or burning when urinating, or frequent urination) *BOWEL PROBLEMS (unusual diarrhea, constipation, pain near the anus) TENDERNESS IN MOUTH AND THROAT WITH OR WITHOUT PRESENCE OF ULCERS (sore throat, sores in mouth, or a toothache) UNUSUAL RASH, SWELLING OR PAIN  UNUSUAL VAGINAL DISCHARGE OR ITCHING   Items with * indicate a potential emergency and should be followed up as soon as possible or go to the Emergency Department if any problems should occur.  Please show the CHEMOTHERAPY ALERT CARD or  IMMUNOTHERAPY ALERT CARD at check-in to the Emergency Department and triage nurse.  Should you have questions after your visit or need to cancel or reschedule your appointment, please contact [facility]  Dept: [phone]  and follow the prompts.  Office hours are 8:00 a.m. to 4:30 p.m. Monday - Friday. Please note that voicemails left after 4:00 p.m. may not be returned until the following business day.  We are closed weekends and major holidays. You have access to a nurse at all times for urgent questions. Please call the main number to the clinic Dept: [phone] and follow the prompts.   For any non-urgent questions, you may also contact your provider using MyChart. We now offer e-Visits for anyone 59 and older to request care online for non-urgent symptoms. For details visit [email or web address].   Also download the MyChart app! Go to the app store, search "MyChart", open the app, select [facility], and log in with your MyChart username and password.  Masks are optional in the cancer centers. If you would like for your care team to wear a mask while they are taking care of you, please let them know. You may have one support person who is at least 73 years old accompany you for your appointments.  Carfilzomib Injection What is this medication? CARFILZOMIB (kar FILZ oh mib) treats multiple myeloma, a type of bone marrow cancer. It works by blocking a protein that causes cancer cells to grow and multiply. This helps to slow or stop the spread of cancer cells. This medicine may be used for other purposes; ask your health  care provider or pharmacist if you have questions. COMMON BRAND NAME(S): KYPROLIS What should I tell my care team before I take this medication? They need to know if you have any of these conditions: Heart disease History of blood clots Irregular heartbeat Kidney disease Liver disease Lung or breathing disease An unusual or allergic  reaction to carfilzomib, or other medications, foods, dyes, or preservatives If you or your partner are pregnant or trying to get pregnant Breastfeeding How should I use this medication? This medication is injected into a vein. It is given by your care team in a hospital or clinic setting. Talk to your care team about the use of this medication in children. Special care may be needed. Overdosage: If you think you have taken too much of this medicine contact a poison control center or emergency room at once. NOTE: This medicine is only for you. Do not share this medicine with others. What if I miss a dose? Keep appointments for follow-up doses. It is important not to miss your dose. Call your care team if you are unable to keep an appointment. What may interact with this medication? Interactions are not expected. This list may not describe all possible interactions. Give your health care provider a list of all the medicines, herbs, non-prescription drugs, or dietary supplements you use. Also tell them if you smoke, drink alcohol, or use illegal drugs. Some items may interact with your medicine. What should I watch for while using this medication? Your condition will be monitored carefully while you are receiving this medication. You may need blood work while taking this medication. Check with your care team if you have severe diarrhea, nausea, and vomiting, or if you sweat a lot. The loss of too much body fluid may make it dangerous for you to take this medication. This medication may affect your coordination, reaction time, or judgment. Do not drive or operate machinery until you know how this medication affects you. Sit up or stand slowly to reduce the risk of dizzy or fainting spells. Drinking alcohol with this medication can increase the risk of these side effects. Talk to your care team if you may be pregnant. Serious birth defects can occur if you take this medication during pregnancy and for 6  months after the last dose. You will need a negative pregnancy test before starting this medication. Contraception is recommended while taking this medication and for 6 months after the last dose. Your care team can help you find an option that works for you. If your partner can get pregnant, use a condom during sex while taking this medication and for 3 months after the last dose. Do not breastfeed while taking this medication and for 2 weeks after the last dose. This medication may cause infertility. Talk to your care team if you are concerned about your fertility. What side effects may I notice from receiving this medication? Side effects that you should report to your care team as soon as possible: Allergic reactions--skin rash, itching, hives, swelling of the face, lips, tongue, or throat Bleeding--bloody or black, tar-like stools, vomiting blood or brown material that looks like coffee grounds, red or dark brown urine, small red or purple spots on skin, unusual bruising or bleeding Blood clot--pain, swelling, or warmth in the leg, shortness of breath, chest pain Dizziness, loss of balance or coordination, confusion or trouble speaking Heart attack--pain or tightness in the chest, shoulders, arms, or jaw, nausea, shortness of breath, cold or clammy skin, feeling faint  or lightheaded Heart failure--shortness of breath, swelling of the ankles, feet, or hands, sudden weight gain, unusual weakness or fatigue Heart rhythm changes--fast or irregular heartbeat, dizziness, feeling faint or lightheaded, chest pain, trouble breathing Increase in blood pressure Infection--fever, chills, cough, sore throat, wounds that don't heal, pain or trouble when passing urine, general feeling of discomfort or being unwell Infusion reactions--chest pain, shortness of breath or trouble breathing, feeling faint or lightheaded Kidney injury--decrease in the amount of urine, swelling of the ankles, hands, or feet Liver  injury--right upper belly pain, loss of appetite, nausea, light-colored stool, dark yellow or brown urine, yellowing skin or eyes, unusual weakness or fatigue Lung injury--shortness of breath or trouble breathing, cough, spitting up blood, chest pain, fever Pulmonary hypertension--shortness of breath, chest pain, fast or irregular heartbeat, feeling faint or lightheaded, fatigue, swelling of the ankles or feet Stomach pain, bloody diarrhea, pale skin, unusual weakness or fatigue, decrease in the amount of urine, which may be signs of hemolytic uremic syndrome Sudden and severe headache, confusion, change in vision, seizures, which may be signs of posterior reversible encephalopathy syndrome (PRES) TTP--purple spots on the skin or inside the mouth, pale skin, yellowing skin or eyes, unusual weakness or fatigue, fever, fast or irregular heartbeat, confusion, change in vision, trouble speaking, trouble walking Tumor lysis syndrome (TLS)--nausea, vomiting, diarrhea, decrease in the amount of urine, dark urine, unusual weakness or fatigue, confusion, muscle pain or cramps, fast or irregular heartbeat, joint pain Side effects that usually do not require medical attention (report to your care team if they continue or are bothersome): Diarrhea Fatigue Nausea Trouble sleeping This list may not describe all possible side effects. Call your doctor for medical advice about side effects. You may report side effects to FDA at 1-800-FDA-1088. Where should I keep my medication? This medication is given in a hospital or clinic. It will not be stored at home. NOTE: This sheet is a summary. It may not cover all possible information. If you have questions about this medicine, talk to your doctor, pharmacist, or health care provider.  2023 Elsevier/Gold Standard (2022-02-01 00:00:00)  Cyclophosphamide Injection What is this medication? CYCLOPHOSPHAMIDE (sye kloe FOSS fa mide) treats some types of cancer. It works by  slowing down the growth of cancer cells. This medicine may be used for other purposes; ask your health care provider or pharmacist if you have questions. COMMON BRAND NAME(S): Cyclophosphamide, Cytoxan, Neosar What should I tell my care team before I take this medication? They need to know if you have any of these conditions: Heart disease Irregular heartbeat or rhythm Infection Kidney problems Liver disease Low blood cell levels (white cells, platelets, or red blood cells) Lung disease Previous radiation Trouble passing urine An unusual or allergic reaction to cyclophosphamide, other medications, foods, dyes, or preservatives Pregnant or trying to get pregnant Breast-feeding How should I use this medication? This medication is injected into a vein. It is given by your care team in a hospital or clinic setting. Talk to your care team about the use of this medication in children. Special care may be needed. Overdosage: If you think you have taken too much of this medicine contact a poison control center or emergency room at once. NOTE: This medicine is only for you. Do not share this medicine with others. What if I miss a dose? Keep appointments for follow-up doses. It is important not to miss your dose. Call your care team if you are unable to keep an appointment. What may  interact with this medication? Amphotericin Pharaoh Amiodarone Azathioprine Certain antivirals for HIV or hepatitis Certain medications for blood pressure, such as enalapril, lisinopril, quinapril Cyclosporine Diuretics Etanercept Indomethacin Medications that relax muscles Metronidazole Natalizumab Tamoxifen Warfarin This list may not describe all possible interactions. Give your health care provider a list of all the medicines, herbs, non-prescription drugs, or dietary supplements you use. Also tell them if you smoke, drink alcohol, or use illegal drugs. Some items may interact with your medicine. What should I  watch for while using this medication? This medication may make you feel generally unwell. This is not uncommon as chemotherapy can affect healthy cells as well as cancer cells. Report any side effects. Continue your course of treatment even though you feel ill unless your care team tells you to stop. You may need blood work while you are taking this medication. This medication may increase your risk of getting an infection. Call your care team for advice if you get a fever, chills, sore throat, or other symptoms of a cold or flu. Do not treat yourself. Try to avoid being around people who are sick. Avoid taking medications that contain aspirin, acetaminophen, ibuprofen, naproxen, or ketoprofen unless instructed by your care team. These medications may hide a fever. Be careful brushing or flossing your teeth or using a toothpick because you may get an infection or bleed more easily. If you have any dental work done, tell your dentist you are receiving this medication. Drink water or other fluids as directed. Urinate often, even at night. Some products may contain alcohol. Ask your care team if this medication contains alcohol. Be sure to tell all care teams you are taking this medicine. Certain medicines, like metronidazole and disulfiram, can cause an unpleasant reaction when taken with alcohol. The reaction includes flushing, headache, nausea, vomiting, sweating, and increased thirst. The reaction can last from 30 minutes to several hours. Talk to your care team if you wish to become pregnant or think you might be pregnant. This medication can cause serious birth defects if taken during pregnancy and for 1 year after the last dose. A negative pregnancy test is required before starting this medication. A reliable form of contraception is recommended while taking this medication and for 1 year after the last dose. Talk to your care team about reliable forms of contraception. Do not father a child while  taking this medication and for 4 months after the last dose. Use a condom during this time period. Do not breast-feed while taking this medication or for 1 week after the last dose. This medication may cause infertility. Talk to your care team if you are concerned about your fertility. Talk to your care team about your risk of cancer. You may be more at risk for certain types of cancer if you take this medication. What side effects may I notice from receiving this medication? Side effects that you should report to your care team as soon as possible: Allergic reactions--skin rash, itching, hives, swelling of the face, lips, tongue, or throat Dry cough, shortness of breath or trouble breathing Heart failure--shortness of breath, swelling of the ankles, feet, or hands, sudden weight gain, unusual weakness or fatigue Heart muscle inflammation--unusual weakness or fatigue, shortness of breath, chest pain, fast or irregular heartbeat, dizziness, swelling of the ankles, feet, or hands Heart rhythm changes--fast or irregular heartbeat, dizziness, feeling faint or lightheaded, chest pain, trouble breathing Infection--fever, chills, cough, sore throat, wounds that don't heal, pain or trouble when passing urine,  general feeling of discomfort or being unwell Kidney injury--decrease in the amount of urine, swelling of the ankles, hands, or feet Liver injury--right upper belly pain, loss of appetite, nausea, light-colored stool, dark yellow or brown urine, yellowing skin or eyes, unusual weakness or fatigue Low red blood cell level--unusual weakness or fatigue, dizziness, headache, trouble breathing Low sodium level--muscle weakness, fatigue, dizziness, headache, confusion Red or dark brown urine Unusual bruising or bleeding Side effects that usually do not require medical attention (report to your care team if they continue or are bothersome): Hair loss Irregular menstrual cycles or spotting Loss of  appetite Nausea Pain, redness, or swelling with sores inside the mouth or throat Vomiting This list may not describe all possible side effects. Call your doctor for medical advice about side effects. You may report side effects to FDA at 1-800-FDA-1088. Where should I keep my medication? This medication is given in a hospital or clinic. It will not be stored at home. NOTE: This sheet is a summary. It may not cover all possible information. If you have questions about this medicine, talk to your doctor, pharmacist, or health care provider.  2023 Elsevier/Gold Standard (2021-10-25 00:00:00)

## 2022-08-30 NOTE — Progress Notes (Signed)
Per Dr. Benay Spice: OK to treat today with platelet count 95,000

## 2022-08-31 ENCOUNTER — Other Ambulatory Visit: Payer: Self-pay | Admitting: *Deleted

## 2022-08-31 DIAGNOSIS — C9000 Multiple myeloma not having achieved remission: Secondary | ICD-10-CM

## 2022-09-04 ENCOUNTER — Other Ambulatory Visit: Payer: Self-pay | Admitting: *Deleted

## 2022-09-04 DIAGNOSIS — C9000 Multiple myeloma not having achieved remission: Secondary | ICD-10-CM

## 2022-09-04 NOTE — Progress Notes (Signed)
Mr. Hayashi called to report Dr. Melba Coon wishes for Dr. Benay Spice to collect full myeloma lab panel and light chains at his visit on 12/202/23.

## 2022-09-06 ENCOUNTER — Encounter: Payer: Self-pay | Admitting: Oncology

## 2022-09-06 ENCOUNTER — Encounter: Payer: Self-pay | Admitting: *Deleted

## 2022-09-06 ENCOUNTER — Inpatient Hospital Stay: Payer: Medicare Other

## 2022-09-06 ENCOUNTER — Inpatient Hospital Stay (HOSPITAL_BASED_OUTPATIENT_CLINIC_OR_DEPARTMENT_OTHER): Payer: Medicare Other | Admitting: Oncology

## 2022-09-06 VITALS — BP 124/63 | HR 71 | Resp 20

## 2022-09-06 VITALS — BP 128/68 | HR 80 | Temp 98.1°F | Resp 20 | Ht 69.0 in | Wt 184.8 lb

## 2022-09-06 DIAGNOSIS — C9000 Multiple myeloma not having achieved remission: Secondary | ICD-10-CM

## 2022-09-06 DIAGNOSIS — Z5112 Encounter for antineoplastic immunotherapy: Secondary | ICD-10-CM | POA: Diagnosis not present

## 2022-09-06 DIAGNOSIS — Z9481 Bone marrow transplant status: Secondary | ICD-10-CM

## 2022-09-06 DIAGNOSIS — G620 Drug-induced polyneuropathy: Secondary | ICD-10-CM

## 2022-09-06 LAB — CMP (CANCER CENTER ONLY)
ALT: 15 U/L (ref 0–44)
AST: 15 U/L (ref 15–41)
Albumin: 4.1 g/dL (ref 3.5–5.0)
Alkaline Phosphatase: 55 U/L (ref 38–126)
Anion gap: 9 (ref 5–15)
BUN: 33 mg/dL — ABNORMAL HIGH (ref 8–23)
CO2: 23 mmol/L (ref 22–32)
Calcium: 10 mg/dL (ref 8.9–10.3)
Chloride: 102 mmol/L (ref 98–111)
Creatinine: 1.18 mg/dL (ref 0.61–1.24)
GFR, Estimated: >60 mL/min (ref >60)
Glucose, Bld: 63 mg/dL — ABNORMAL LOW (ref 70–99)
Potassium: 3.9 mmol/L (ref 3.5–5.1)
Sodium: 134 mmol/L — ABNORMAL LOW (ref 135–145)
Total Bilirubin: 0.4 mg/dL (ref 0.3–1.2)
Total Protein: 7.4 g/dL (ref 6.5–8.1)

## 2022-09-06 LAB — CBC WITH DIFFERENTIAL (CANCER CENTER ONLY)
Abs Immature Granulocytes: 0.02 10*3/uL (ref 0.00–0.07)
Basophils Absolute: 0 10*3/uL (ref 0.0–0.1)
Basophils Relative: 0 %
Eosinophils Absolute: 0.1 10*3/uL (ref 0.0–0.5)
Eosinophils Relative: 3 %
HCT: 26.1 % — ABNORMAL LOW (ref 39.0–52.0)
Hemoglobin: 8.3 g/dL — ABNORMAL LOW (ref 13.0–17.0)
Immature Granulocytes: 1 %
Lymphocytes Relative: 7 %
Lymphs Abs: 0.3 10*3/uL — ABNORMAL LOW (ref 0.7–4.0)
MCH: 32.5 pg (ref 26.0–34.0)
MCHC: 31.8 g/dL (ref 30.0–36.0)
MCV: 102.4 fL — ABNORMAL HIGH (ref 80.0–100.0)
Monocytes Absolute: 0.7 10*3/uL (ref 0.1–1.0)
Monocytes Relative: 18 %
Neutro Abs: 2.6 10*3/uL (ref 1.7–7.7)
Neutrophils Relative %: 71 %
Platelet Count: 73 10*3/uL — ABNORMAL LOW (ref 150–400)
RBC: 2.55 MIL/uL — ABNORMAL LOW (ref 4.22–5.81)
RDW: 17.5 % — ABNORMAL HIGH (ref 11.5–15.5)
WBC Count: 3.6 10*3/uL — ABNORMAL LOW (ref 4.0–10.5)
nRBC: 0 % (ref 0.0–0.2)

## 2022-09-06 MED ORDER — SODIUM CHLORIDE 0.9 % IV SOLN: Freq: Once | INTRAVENOUS | Status: AC

## 2022-09-06 MED ORDER — SODIUM CHLORIDE 0.9 % IV SOLN
250.0000 mg/m2 | Freq: Once | INTRAVENOUS | Status: AC
  Administered 2022-09-06: 500 mg via INTRAVENOUS
  Filled 2022-09-06: qty 25

## 2022-09-06 MED ORDER — DEXAMETHASONE 4 MG PO TABS
10.0000 mg | ORAL_TABLET | Freq: Once | ORAL | Status: AC
  Administered 2022-09-06: 10 mg via ORAL
  Filled 2022-09-06: qty 3

## 2022-09-06 MED ORDER — DEXTROSE 5 % IV SOLN
56.0000 mg/m2 | Freq: Once | INTRAVENOUS | Status: AC
  Administered 2022-09-06: 110 mg via INTRAVENOUS
  Filled 2022-09-06: qty 30

## 2022-09-06 NOTE — Progress Notes (Signed)
Patient seen by Dr. Benay Spice today  Vitals are within treatment parameters.  Labs reviewed by Dr. Benay Spice and are not all within treatment parameters. Platelet 73,000--OK to proceed  Per physician team, patient is ready for treatment and there are NO modifications to the treatment plan.

## 2022-09-06 NOTE — Progress Notes (Signed)
[facility] OFFICE PROGRESS NOTE   Diagnosis: Multiple myeloma  INTERVAL HISTORY:   Andre [name] returns as scheduled.  He continues treatment with carfilzomib, cyclophosphamide, and Decadron.  No nausea, mouth sores, dyspnea, or rash.  He is tolerating the Decadron well. He saw Dr. Melba [name] earlier this week.  The plan is to proceed with CAR-T therapy.  He underwent CAR-T a pheresis in September and is scheduled to begin lymph node ablation therapy on 09/20/2022 followed by CAR-T infusion on 09/25/2022.  Objective:  Vital signs in last 24 hours:  Blood pressure 128/68, pulse 80, temperature 98.1 F (36.7 C), temperature source Oral, resp. rate 20, height _0  (1.753 m), weight 184 lb 12.8 oz (83.8 kg), SpO2 100 %.    HEENT: No thrush Resp: Lungs clear bilaterally Cardio: Regular rate and rhythm GI: No hepatosplenomegaly, nontender Vascular: No leg edema   Lab Results:  Lab Results  Component Value Date   WBC 3.6 (L) 09/06/2022   HGB 8.3 (L) 09/06/2022   HCT 26.1 (L) 09/06/2022   MCV 102.4 (H) 09/06/2022   PLT 73 (L) 09/06/2022   NEUTROABS 2.6 09/06/2022    CMP  Lab Results  Component Value Date   NA 134 (L) 09/06/2022   K 3.9 09/06/2022   CL 102 09/06/2022   CO2 23 09/06/2022   GLUCOSE 63 (L) 09/06/2022   BUN 33 (H) 09/06/2022   CREATININE 1.18 09/06/2022   CALCIUM 10.0 09/06/2022   PROT 7.4 09/06/2022   ALBUMIN 4.1 09/06/2022   AST 15 09/06/2022   ALT 15 09/06/2022   ALKPHOS 55 09/06/2022   BILITOT 0.4 09/06/2022   GFRNONAA >60 09/06/2022   GFRAA >60 06/15/2020     Medications: I have reviewed the patient's current medications.   Assessment/Plan: Multiple myeloma- IgG lambda  bortezomib (subcutaneously), lenalidomide, and dexamethasone, with repeat bone marrow biopsy May of 2012 showing 10% plasmacytosis   (2) high-dose chemotherapy with BCNU and melphalan at [facility], followed by stem cell rescue July of 2012   (3) on zoledronic acid  started December of 2012, initially monthly, currently given every 3 months, most recent dose  12/07/2015   (4) low-dose lenalidomide resumed April 2013, interrupted several times.  Resumed again on 02/19/2013, ata dose of 5 mg daily, 21 days on and 7 days off, later further reduced to 7 days on, 7 days off   (5) CNS symptoms and abnormal brain MRI extensively evaluated by neurology with no definitive diagnosis established   (6) rising M spike noted June 2014 but did not meet criteria for carfilzomib study   (7) on lenalidomide 25 mg daily, 14 days on, 7 days off, starting 04/18/2013, interrupted December 2014 because of rash;             (a) resumed January 2015 at 10 mg/ day at 21 days on/ 7 days off             (Andre [name]) starting 08/18/2014 decreased to 10 mg/ day 14 days on, 7 days off because of cytopenias             (c) as of February 2016 was on 5 mg daily 7 days on 7 days off             (d) lenalidomide discontinued December 2016 with evidence of disease progression   (8) transient global amnesia 05/29/2015, resolved without intervention   (9) starting PVD 10/25/2015 w ASA 325 thromboprophylaxis, valacyclovir prophylaxis, last dose 12/17/2015             (  a) pomalidomide 4 mg/d days 1-14             (Andre [name]) bortezomib sQ days 2,5,9,12 of each 21 day cycle             (c) dexamethasome 20 mg two days a week             (d) dexamethasone bortezomib and pomalidomide discontinued late December 2018 with poor tolerance   (10) metapneumovirus pneumonia April 2017             (a) completing course of steroids and week of bactrim mid April 2017   (11) status post second autologous transplant at [facility] 02/04/2016(preparatory regimen melphalan 200 mg/m)             (a) received twelve-month vaccinations 03/14/2017 (DPT, Haemophilus, Pneumovax 13, polio)             (Andre [name]) 14 months injections 05/04/2016 include DTaP, Hib conjugate, HepB energex Andre [name] 20 mcg/ml, Prevnar 13             (c) 24 month vaccines  due at [facility] June 2019   (12) maintenance therapy started November 2017, consisting of             (a) bortezomib 1.3 mg/M2 every 14 days, first dose 07/27/2016             (Andre [name]) pomalidomide 1 mg days 1-21 Q28 days, started 07/19/2016             (c) zolendronate monthly started 07/27/2016 (previously Q 3 months) however patient unable to tolerate, and changed back to q3 months in April, 2018             (d) Bortezomib changed to monthly as of June 2018 because of tolerance issues, however discontinued after 09/14/2017 dose because of a rise in his M spike             (e) pomalidomide held after 10/18/2017 in preparation for possible study at [place] (venetoclax)--never resumed             (f) with numbers actually improved off treatment, resumed every 2-week bortezomib 12/25/2017             (g) changed to every 3-week bortezomib as of 03/17/2019             (h) briefly on weekly treatments times 22 October 2020 due to increase in M spike             (I) maintenance therapy discontinued with evidence of progression   (13) bortezomib/daratumumab/dexamethasone started 12/20/2020.             (a) Decadron dose dropped to 10 mg day of and day following treatment starting May 2022             (Andre [name]) day 8 bortezomib omitted beginning with the June cycle per patient preference             (C) treatment changed to monthly daratumumab/Velcade/Decadron beginning 06/27/2021-patient decision (14) weekly Velcade/Cytoxan beginning 11/14/2021 (15) changed to Cytoxan/carfilzomib/Decadron 12/19/2021-treatment placed on hold 05/15/2022, carfilzomib 05/24/2022 and 05/31/2022 16.  Leukocyte a pheresis procedure at [facility] 06/15/2022 17.  Treatment resumed with Cytoxan/carfilzomib/Decadron 06/28/2022, last given 09/06/2022    Disposition: [name] appears stable.  He is tolerating the current treatment well.  He has persistent anemia and thrombocytopenia secondary to multiple myeloma and treatment.  The free  light chains, IgG, and M spike were stable last month.  We will follow-up  on the myeloma panel from today.  He will complete a final treatment with cyclophosphamide, carfilzomib, and Decadron today.  He will then follow-up with Dr. Philipp Ovens for CAR-T therapy.  He will be scheduled for an office visit here in February.  I am available to see him sooner as needed.  Betsy Coder, MD  09/06/2022  10:37 AM

## 2022-09-06 NOTE — Patient Instructions (Signed)
[facility]   Discharge Instructions: Thank you for choosing [facility] to provide your oncology and hematology care.   If you have a lab appointment with the Cancer Center, please go directly to the Cancer Center and check in at the registration area.   Wear comfortable clothing and clothing appropriate for easy access to any Portacath or PICC line.   We strive to give you quality time with your provider. You may need to reschedule your appointment if you arrive late (15 or more minutes).  Arriving late affects you and other patients whose appointments are after yours.  Also, if you miss three or more appointments without notifying the office, you may be dismissed from the clinic at the provider's discretion.      For prescription refill requests, have your pharmacy contact our office and allow 72 hours for refills to be completed.    Today you received the following chemotherapy and/or immunotherapy agents Carfilzomib (KYPROLIS) & Cyclophosphamide (CYTOXAN).      To help prevent nausea and vomiting after your treatment, we encourage you to take your nausea medication as directed.  BELOW ARE SYMPTOMS THAT SHOULD BE REPORTED IMMEDIATELY: *FEVER GREATER THAN 100.4 F (38 C) OR HIGHER *CHILLS OR SWEATING *NAUSEA AND VOMITING THAT IS NOT CONTROLLED WITH YOUR NAUSEA MEDICATION *UNUSUAL SHORTNESS OF BREATH *UNUSUAL BRUISING OR BLEEDING *URINARY PROBLEMS (pain or burning when urinating, or frequent urination) *BOWEL PROBLEMS (unusual diarrhea, constipation, pain near the anus) TENDERNESS IN MOUTH AND THROAT WITH OR WITHOUT PRESENCE OF ULCERS (sore throat, sores in mouth, or a toothache) UNUSUAL RASH, SWELLING OR PAIN  UNUSUAL VAGINAL DISCHARGE OR ITCHING   Items with * indicate a potential emergency and should be followed up as soon as possible or go to the Emergency Department if any problems should occur.  Please show the CHEMOTHERAPY ALERT CARD or  IMMUNOTHERAPY ALERT CARD at check-in to the Emergency Department and triage nurse.  Should you have questions after your visit or need to cancel or reschedule your appointment, please contact [facility]  Dept: [phone]  and follow the prompts.  Office hours are 8:00 a.m. to 4:30 p.m. Monday - Friday. Please note that voicemails left after 4:00 p.m. may not be returned until the following business day.  We are closed weekends and major holidays. You have access to a nurse at all times for urgent questions. Please call the main number to the clinic Dept: [phone] and follow the prompts.   For any non-urgent questions, you may also contact your provider using MyChart. We now offer e-Visits for anyone 18 and older to request care online for non-urgent symptoms. For details visit [email or web address].   Also download the MyChart app! Go to the app store, search "MyChart", open the app, select [facility], and log in with your MyChart username and password.  Masks are optional in the cancer centers. If you would like for your care team to wear a mask while they are taking care of you, please let them know. You may have one support person who is at least 73 years old accompany you for your appointments.  Carfilzomib Injection What is this medication? CARFILZOMIB (kar FILZ oh mib) treats multiple myeloma, a type of bone marrow cancer. It works by blocking a protein that causes cancer cells to grow and multiply. This helps to slow or stop the spread of cancer cells. This medicine may be used for other purposes; ask your health   care provider or pharmacist if you have questions. COMMON BRAND NAME(S): KYPROLIS What should I tell my care team before I take this medication? They need to know if you have any of these conditions: Heart disease History of blood clots Irregular heartbeat Kidney disease Liver disease Lung or breathing disease An unusual or allergic  reaction to carfilzomib, or other medications, foods, dyes, or preservatives If you or your partner are pregnant or trying to get pregnant Breastfeeding How should I use this medication? This medication is injected into a vein. It is given by your care team in a hospital or clinic setting. Talk to your care team about the use of this medication in children. Special care may be needed. Overdosage: If you think you have taken too much of this medicine contact a poison control center or emergency room at once. NOTE: This medicine is only for you. Do not share this medicine with others. What if I miss a dose? Keep appointments for follow-up doses. It is important not to miss your dose. Call your care team if you are unable to keep an appointment. What may interact with this medication? Interactions are not expected. This list may not describe all possible interactions. Give your health care provider a list of all the medicines, herbs, non-prescription drugs, or dietary supplements you use. Also tell them if you smoke, drink alcohol, or use illegal drugs. Some items may interact with your medicine. What should I watch for while using this medication? Your condition will be monitored carefully while you are receiving this medication. You may need blood work while taking this medication. Check with your care team if you have severe diarrhea, nausea, and vomiting, or if you sweat a lot. The loss of too much body fluid may make it dangerous for you to take this medication. This medication may affect your coordination, reaction time, or judgment. Do not drive or operate machinery until you know how this medication affects you. Sit up or stand slowly to reduce the risk of dizzy or fainting spells. Drinking alcohol with this medication can increase the risk of these side effects. Talk to your care team if you may be pregnant. Serious birth defects can occur if you take this medication during pregnancy and for 6  months after the last dose. You will need a negative pregnancy test before starting this medication. Contraception is recommended while taking this medication and for 6 months after the last dose. Your care team can help you find an option that works for you. If your partner can get pregnant, use a condom during sex while taking this medication and for 3 months after the last dose. Do not breastfeed while taking this medication and for 2 weeks after the last dose. This medication may cause infertility. Talk to your care team if you are concerned about your fertility. What side effects may I notice from receiving this medication? Side effects that you should report to your care team as soon as possible: Allergic reactions--skin rash, itching, hives, swelling of the face, lips, tongue, or throat Bleeding--bloody or black, tar-like stools, vomiting blood or brown material that looks like coffee grounds, red or dark brown urine, small red or purple spots on skin, unusual bruising or bleeding Blood clot--pain, swelling, or warmth in the leg, shortness of breath, chest pain Dizziness, loss of balance or coordination, confusion or trouble speaking Heart attack--pain or tightness in the chest, shoulders, arms, or jaw, nausea, shortness of breath, cold or clammy skin, feeling faint  or lightheaded Heart failure--shortness of breath, swelling of the ankles, feet, or hands, sudden weight gain, unusual weakness or fatigue Heart rhythm changes--fast or irregular heartbeat, dizziness, feeling faint or lightheaded, chest pain, trouble breathing Increase in blood pressure Infection--fever, chills, cough, sore throat, wounds that don't heal, pain or trouble when passing urine, general feeling of discomfort or being unwell Infusion reactions--chest pain, shortness of breath or trouble breathing, feeling faint or lightheaded Kidney injury--decrease in the amount of urine, swelling of the ankles, hands, or feet Liver  injury--right upper belly pain, loss of appetite, nausea, light-colored stool, dark yellow or brown urine, yellowing skin or eyes, unusual weakness or fatigue Lung injury--shortness of breath or trouble breathing, cough, spitting up blood, chest pain, fever Pulmonary hypertension--shortness of breath, chest pain, fast or irregular heartbeat, feeling faint or lightheaded, fatigue, swelling of the ankles or feet Stomach pain, bloody diarrhea, pale skin, unusual weakness or fatigue, decrease in the amount of urine, which may be signs of hemolytic uremic syndrome Sudden and severe headache, confusion, change in vision, seizures, which may be signs of posterior reversible encephalopathy syndrome (PRES) TTP--purple spots on the skin or inside the mouth, pale skin, yellowing skin or eyes, unusual weakness or fatigue, fever, fast or irregular heartbeat, confusion, change in vision, trouble speaking, trouble walking Tumor lysis syndrome (TLS)--nausea, vomiting, diarrhea, decrease in the amount of urine, dark urine, unusual weakness or fatigue, confusion, muscle pain or cramps, fast or irregular heartbeat, joint pain Side effects that usually do not require medical attention (report to your care team if they continue or are bothersome): Diarrhea Fatigue Nausea Trouble sleeping This list may not describe all possible side effects. Call your doctor for medical advice about side effects. You may report side effects to FDA at 1-800-FDA-1088. Where should I keep my medication? This medication is given in a hospital or clinic. It will not be stored at home. NOTE: This sheet is a summary. It may not cover all possible information. If you have questions about this medicine, talk to your doctor, pharmacist, or health care provider.  2023 Elsevier/Gold Standard (2022-02-01 00:00:00)  Cyclophosphamide Injection What is this medication? CYCLOPHOSPHAMIDE (sye kloe FOSS fa mide) treats some types of cancer. It works by  slowing down the growth of cancer cells. This medicine may be used for other purposes; ask your health care provider or pharmacist if you have questions. COMMON BRAND NAME(S): Cyclophosphamide, Cytoxan, Neosar What should I tell my care team before I take this medication? They need to know if you have any of these conditions: Heart disease Irregular heartbeat or rhythm Infection Kidney problems Liver disease Low blood cell levels (white cells, platelets, or red blood cells) Lung disease Previous radiation Trouble passing urine An unusual or allergic reaction to cyclophosphamide, other medications, foods, dyes, or preservatives Pregnant or trying to get pregnant Breast-feeding How should I use this medication? This medication is injected into a vein. It is given by your care team in a hospital or clinic setting. Talk to your care team about the use of this medication in children. Special care may be needed. Overdosage: If you think you have taken too much of this medicine contact a poison control center or emergency room at once. NOTE: This medicine is only for you. Do not share this medicine with others. What if I miss a dose? Keep appointments for follow-up doses. It is important not to miss your dose. Call your care team if you are unable to keep an appointment. What may  interact with this medication? Amphotericin Christipher Amiodarone Azathioprine Certain antivirals for HIV or hepatitis Certain medications for blood pressure, such as enalapril, lisinopril, quinapril Cyclosporine Diuretics Etanercept Indomethacin Medications that relax muscles Metronidazole Natalizumab Tamoxifen Warfarin This list may not describe all possible interactions. Give your health care provider a list of all the medicines, herbs, non-prescription drugs, or dietary supplements you use. Also tell them if you smoke, drink alcohol, or use illegal drugs. Some items may interact with your medicine. What should I  watch for while using this medication? This medication may make you feel generally unwell. This is not uncommon as chemotherapy can affect healthy cells as well as cancer cells. Report any side effects. Continue your course of treatment even though you feel ill unless your care team tells you to stop. You may need blood work while you are taking this medication. This medication may increase your risk of getting an infection. Call your care team for advice if you get a fever, chills, sore throat, or other symptoms of a cold or flu. Do not treat yourself. Try to avoid being around people who are sick. Avoid taking medications that contain aspirin, acetaminophen, ibuprofen, naproxen, or ketoprofen unless instructed by your care team. These medications may hide a fever. Be careful brushing or flossing your teeth or using a toothpick because you may get an infection or bleed more easily. If you have any dental work done, tell your dentist you are receiving this medication. Drink water or other fluids as directed. Urinate often, even at night. Some products may contain alcohol. Ask your care team if this medication contains alcohol. Be sure to tell all care teams you are taking this medicine. Certain medicines, like metronidazole and disulfiram, can cause an unpleasant reaction when taken with alcohol. The reaction includes flushing, headache, nausea, vomiting, sweating, and increased thirst. The reaction can last from 30 minutes to several hours. Talk to your care team if you wish to become pregnant or think you might be pregnant. This medication can cause serious birth defects if taken during pregnancy and for 1 year after the last dose. A negative pregnancy test is required before starting this medication. A reliable form of contraception is recommended while taking this medication and for 1 year after the last dose. Talk to your care team about reliable forms of contraception. Do not father a child while  taking this medication and for 4 months after the last dose. Use a condom during this time period. Do not breast-feed while taking this medication or for 1 week after the last dose. This medication may cause infertility. Talk to your care team if you are concerned about your fertility. Talk to your care team about your risk of cancer. You may be more at risk for certain types of cancer if you take this medication. What side effects may I notice from receiving this medication? Side effects that you should report to your care team as soon as possible: Allergic reactions--skin rash, itching, hives, swelling of the face, lips, tongue, or throat Dry cough, shortness of breath or trouble breathing Heart failure--shortness of breath, swelling of the ankles, feet, or hands, sudden weight gain, unusual weakness or fatigue Heart muscle inflammation--unusual weakness or fatigue, shortness of breath, chest pain, fast or irregular heartbeat, dizziness, swelling of the ankles, feet, or hands Heart rhythm changes--fast or irregular heartbeat, dizziness, feeling faint or lightheaded, chest pain, trouble breathing Infection--fever, chills, cough, sore throat, wounds that don't heal, pain or trouble when passing urine,   general feeling of discomfort or being unwell Kidney injury--decrease in the amount of urine, swelling of the ankles, hands, or feet Liver injury--right upper belly pain, loss of appetite, nausea, light-colored stool, dark yellow or brown urine, yellowing skin or eyes, unusual weakness or fatigue Low red blood cell level--unusual weakness or fatigue, dizziness, headache, trouble breathing Low sodium level--muscle weakness, fatigue, dizziness, headache, confusion Red or dark brown urine Unusual bruising or bleeding Side effects that usually do not require medical attention (report to your care team if they continue or are bothersome): Hair loss Irregular menstrual cycles or spotting Loss of  appetite Nausea Pain, redness, or swelling with sores inside the mouth or throat Vomiting This list may not describe all possible side effects. Call your doctor for medical advice about side effects. You may report side effects to FDA at 1-800-FDA-1088. Where should I keep my medication? This medication is given in a hospital or clinic. It will not be stored at home. NOTE: This sheet is a summary. It may not cover all possible information. If you have questions about this medicine, talk to your doctor, pharmacist, or health care provider.  2023 Elsevier/Gold Standard (2021-10-25 00:00:00)  

## 2022-09-07 ENCOUNTER — Other Ambulatory Visit: Payer: Self-pay

## 2022-09-07 LAB — IGG: IgG (Immunoglobin G), Serum: 1720 mg/dL — ABNORMAL HIGH (ref 603–1613)

## 2022-09-07 LAB — KAPPA/LAMBDA LIGHT CHAINS
Kappa free light chain: 6.6 mg/L (ref 3.3–19.4)
Kappa, lambda light chain ratio: 0.03 — ABNORMAL LOW (ref 0.26–1.65)
Lambda free light chains: 201.4 mg/L — ABNORMAL HIGH (ref 5.7–26.3)

## 2022-09-12 LAB — MULTIPLE MYELOMA PANEL, SERUM
Albumin SerPl Elph-Mcnc: 3.6 g/dL (ref 2.9–4.4)
Albumin/Glob SerPl: 1.1 (ref 0.7–1.7)
Alpha 1: 0.2 g/dL (ref 0.0–0.4)
Alpha2 Glob SerPl Elph-Mcnc: 0.8 g/dL (ref 0.4–1.0)
B-Globulin SerPl Elph-Mcnc: 1 g/dL (ref 0.7–1.3)
Gamma Glob SerPl Elph-Mcnc: 1.4 g/dL (ref 0.4–1.8)
Globulin, Total: 3.4 g/dL (ref 2.2–3.9)
IgA: 6 mg/dL — ABNORMAL LOW (ref 61–437)
IgG (Immunoglobin G), Serum: 1653 mg/dL — ABNORMAL HIGH (ref 603–1613)
IgM (Immunoglobulin M), Srm: 28 mg/dL (ref 15–143)
M Protein SerPl Elph-Mcnc: 1.2 g/dL — ABNORMAL HIGH
Total Protein ELP: 7 g/dL (ref 6.0–8.5)

## 2022-09-21 ENCOUNTER — Other Ambulatory Visit (HOSPITAL_BASED_OUTPATIENT_CLINIC_OR_DEPARTMENT_OTHER): Payer: Self-pay

## 2022-09-21 ENCOUNTER — Other Ambulatory Visit (HOSPITAL_COMMUNITY): Payer: Self-pay

## 2022-09-21 ENCOUNTER — Encounter: Payer: Self-pay | Admitting: Oncology

## 2022-09-22 ENCOUNTER — Encounter: Payer: Self-pay | Admitting: Pharmacist

## 2022-09-22 ENCOUNTER — Other Ambulatory Visit (HOSPITAL_COMMUNITY): Payer: Self-pay

## 2022-09-22 ENCOUNTER — Other Ambulatory Visit: Payer: Self-pay

## 2022-09-22 ENCOUNTER — Encounter: Payer: Self-pay | Admitting: Oncology

## 2022-09-22 ENCOUNTER — Other Ambulatory Visit (HOSPITAL_BASED_OUTPATIENT_CLINIC_OR_DEPARTMENT_OTHER): Payer: Self-pay

## 2022-09-26 DIAGNOSIS — Z9289 Personal history of other medical treatment: Secondary | ICD-10-CM | POA: Insufficient documentation

## 2022-10-02 ENCOUNTER — Other Ambulatory Visit (HOSPITAL_COMMUNITY): Payer: Self-pay

## 2022-10-02 DIAGNOSIS — R5381 Other malaise: Secondary | ICD-10-CM | POA: Insufficient documentation

## 2022-10-06 ENCOUNTER — Other Ambulatory Visit: Payer: Self-pay

## 2022-10-13 DIAGNOSIS — Z7409 Other reduced mobility: Secondary | ICD-10-CM | POA: Insufficient documentation

## 2022-10-13 DIAGNOSIS — Z789 Other specified health status: Secondary | ICD-10-CM | POA: Insufficient documentation

## 2022-10-22 ENCOUNTER — Other Ambulatory Visit: Payer: Self-pay

## 2022-10-24 ENCOUNTER — Encounter: Payer: Self-pay | Admitting: Oncology

## 2022-10-25 ENCOUNTER — Telehealth: Payer: Self-pay | Admitting: *Deleted

## 2022-10-25 DIAGNOSIS — C9000 Multiple myeloma not having achieved remission: Secondary | ICD-10-CM

## 2022-10-25 NOTE — Telephone Encounter (Signed)
Call from [facility] w/Dr. Melba Coon. Requesting lab on 2/12 or 2/13 and again on 2/19 or 2/20. Need CBC/diff, CMP and TXM. Also needs IVIG in 2-4 weeks and then monthly.

## 2022-10-27 ENCOUNTER — Telehealth: Payer: Self-pay | Admitting: *Deleted

## 2022-10-27 ENCOUNTER — Other Ambulatory Visit: Payer: Self-pay

## 2022-10-27 ENCOUNTER — Encounter: Payer: Self-pay | Admitting: Oncology

## 2022-10-27 NOTE — Telephone Encounter (Signed)
Dr. Melba Coon called to request he have twice weekly labs x 2 weeks and will need IVIG in 2-4 weeks and monthly. Thinks his IgG will start to go down soon. He is still having cytopenias.

## 2022-10-29 ENCOUNTER — Emergency Department (HOSPITAL_BASED_OUTPATIENT_CLINIC_OR_DEPARTMENT_OTHER)
Admission: EM | Admit: 2022-10-29 | Discharge: 2022-10-31 | Disposition: A | Discharge status: Home / Self Care | Payer: Medicare Other | Attending: Emergency Medicine | Admitting: Emergency Medicine

## 2022-10-29 ENCOUNTER — Other Ambulatory Visit: Payer: Self-pay

## 2022-10-29 ENCOUNTER — Emergency Department (HOSPITAL_BASED_OUTPATIENT_CLINIC_OR_DEPARTMENT_OTHER): Payer: Medicare Other

## 2022-10-29 DIAGNOSIS — C9000 Multiple myeloma not having achieved remission: Secondary | ICD-10-CM | POA: Insufficient documentation

## 2022-10-29 DIAGNOSIS — R197 Diarrhea, unspecified: Secondary | ICD-10-CM | POA: Insufficient documentation

## 2022-10-29 DIAGNOSIS — M47812 Spondylosis without myelopathy or radiculopathy, cervical region: Secondary | ICD-10-CM | POA: Insufficient documentation

## 2022-10-29 DIAGNOSIS — D696 Thrombocytopenia, unspecified: Secondary | ICD-10-CM | POA: Diagnosis not present

## 2022-10-29 DIAGNOSIS — R630 Anorexia: Secondary | ICD-10-CM | POA: Insufficient documentation

## 2022-10-29 DIAGNOSIS — R296 Repeated falls: Secondary | ICD-10-CM

## 2022-10-29 DIAGNOSIS — R531 Weakness: Secondary | ICD-10-CM | POA: Diagnosis not present

## 2022-10-29 LAB — DIFFERENTIAL
Abs Immature Granulocytes: 0.01 10*3/uL (ref 0.00–0.07)
Basophils Absolute: 0 10*3/uL (ref 0.0–0.1)
Basophils Relative: 0 %
Eosinophils Absolute: 0.2 10*3/uL (ref 0.0–0.5)
Eosinophils Relative: 4 %
Immature Granulocytes: 0 %
Lymphocytes Relative: 12 %
Lymphs Abs: 0.4 10*3/uL — ABNORMAL LOW (ref 0.7–4.0)
Monocytes Absolute: 0.6 10*3/uL (ref 0.1–1.0)
Monocytes Relative: 17 %
Neutro Abs: 2.5 10*3/uL (ref 1.7–7.7)
Neutrophils Relative %: 67 %
Smear Review: DECREASED

## 2022-10-29 LAB — URINALYSIS, ROUTINE W REFLEX MICROSCOPIC
Bilirubin Urine: NEGATIVE
Glucose, UA: NEGATIVE mg/dL
Hgb urine dipstick: NEGATIVE
Ketones, ur: NEGATIVE mg/dL
Leukocytes,Ua: NEGATIVE
Nitrite: NEGATIVE
Protein, ur: NEGATIVE mg/dL
Specific Gravity, Urine: 1.022 (ref 1.005–1.030)
pH: 5 (ref 5.0–8.0)

## 2022-10-29 LAB — COMPREHENSIVE METABOLIC PANEL
ALT: 18 U/L (ref 0–44)
AST: 17 U/L (ref 15–41)
Albumin: 3.9 g/dL (ref 3.5–5.0)
Alkaline Phosphatase: 43 U/L (ref 38–126)
Anion gap: 11 (ref 5–15)
BUN: 37 mg/dL — ABNORMAL HIGH (ref 8–23)
CO2: 21 mmol/L — ABNORMAL LOW (ref 22–32)
Calcium: 9.3 mg/dL (ref 8.9–10.3)
Chloride: 106 mmol/L (ref 98–111)
Creatinine, Ser: 1.03 mg/dL (ref 0.61–1.24)
GFR, Estimated: >60 mL/min (ref >60)
Glucose, Bld: 102 mg/dL — ABNORMAL HIGH (ref 70–99)
Potassium: 3.7 mmol/L (ref 3.5–5.1)
Sodium: 138 mmol/L (ref 135–145)
Total Bilirubin: 0.5 mg/dL (ref 0.3–1.2)
Total Protein: 5.7 g/dL — ABNORMAL LOW (ref 6.5–8.1)

## 2022-10-29 LAB — LIPASE, BLOOD: Lipase: 57 U/L — ABNORMAL HIGH (ref 11–51)

## 2022-10-29 LAB — CBC
HCT: 24.4 % — ABNORMAL LOW (ref 39.0–52.0)
Hemoglobin: 8.6 g/dL — ABNORMAL LOW (ref 13.0–17.0)
MCH: 35.7 pg — ABNORMAL HIGH (ref 26.0–34.0)
MCHC: 35.2 g/dL (ref 30.0–36.0)
MCV: 101.2 fL — ABNORMAL HIGH (ref 80.0–100.0)
Platelets: 26 10*3/uL — CL (ref 150–400)
RBC: 2.41 MIL/uL — ABNORMAL LOW (ref 4.22–5.81)
RDW: 20.9 % — ABNORMAL HIGH (ref 11.5–15.5)
WBC: 3.7 10*3/uL — ABNORMAL LOW (ref 4.0–10.5)
nRBC: 0 % (ref 0.0–0.2)

## 2022-10-29 MED ORDER — LACTATED RINGERS IV BOLUS
1000.0000 mL | Freq: Once | INTRAVENOUS | Status: AC
  Administered 2022-10-29: 1000 mL via INTRAVENOUS

## 2022-10-29 NOTE — ED Triage Notes (Addendum)
Patient coming from home, has had diarrhea since this morning.  Patient denies any nausea or vomiting or abdominal pain.  Wife states that he has been some weakness, cognitive disability, reduced mobility and blood pressure changes since his diagnosis.  Patient's family states that he has had this since a heavy chemo treatment on January 6th.   EMS gave 533m bolus of NS.

## 2022-10-30 ENCOUNTER — Telehealth: Payer: Self-pay | Admitting: *Deleted

## 2022-10-30 DIAGNOSIS — C9000 Multiple myeloma not having achieved remission: Secondary | ICD-10-CM | POA: Diagnosis not present

## 2022-10-30 DIAGNOSIS — R197 Diarrhea, unspecified: Secondary | ICD-10-CM | POA: Diagnosis present

## 2022-10-30 DIAGNOSIS — D696 Thrombocytopenia, unspecified: Secondary | ICD-10-CM | POA: Diagnosis not present

## 2022-10-30 DIAGNOSIS — R630 Anorexia: Secondary | ICD-10-CM | POA: Diagnosis not present

## 2022-10-30 DIAGNOSIS — R531 Weakness: Secondary | ICD-10-CM | POA: Diagnosis not present

## 2022-10-30 MED ORDER — ESCITALOPRAM OXALATE 10 MG PO TABS
10.0000 mg | ORAL_TABLET | Freq: Every day | ORAL | Status: DC
  Administered 2022-10-30 – 2022-10-31 (×2): 10 mg via ORAL
  Filled 2022-10-30 (×2): qty 1

## 2022-10-30 MED ORDER — OYSTER SHELL CALCIUM/D3 500-5 MG-MCG PO TABS
1.0000 | ORAL_TABLET | Freq: Every day | ORAL | Status: DC
  Administered 2022-10-30: 1 via ORAL
  Filled 2022-10-30 (×3): qty 1

## 2022-10-30 MED ORDER — PANTOPRAZOLE SODIUM 40 MG PO TBEC
40.0000 mg | DELAYED_RELEASE_TABLET | Freq: Two times a day (BID) | ORAL | Status: DC
  Administered 2022-10-30 – 2022-10-31 (×3): 40 mg via ORAL
  Filled 2022-10-30 (×3): qty 1

## 2022-10-30 MED ORDER — LORAZEPAM 0.5 MG PO TABS: 0.5000 mg | ORAL_TABLET | Freq: Every evening | ORAL | Status: DC | PRN

## 2022-10-30 MED ORDER — LEVOTHYROXINE SODIUM 75 MCG PO TABS
150.0000 ug | ORAL_TABLET | Freq: Every day | ORAL | Status: DC
  Administered 2022-10-30 – 2022-10-31 (×2): 150 ug via ORAL
  Filled 2022-10-30 (×2): qty 2

## 2022-10-30 NOTE — Progress Notes (Addendum)
CSW spoke with the following SNF faciltiies    [facility]- Reviewing patient  [facility]- Reviewing patient  [facility]- No beds  [facility]- Reviewing patient  [facility]- No male beds  [facility]- Reviewing patient  [facility] room available. Elyse Hsu with admissions will review     DECLINED [facility]  [facility]- Transportation issue with oncology appts  [facility]- Transportation issue with oncology appts

## 2022-10-30 NOTE — Progress Notes (Signed)
Physical Therapy Evaluation Patient Details Name: Andre [name] MRN: [id number] DOB: 1951-01-02 Today's Date: 10/30/2022  History of Present Illness  72 yo male with onset of mult falls at home with wife immediately after a discharge from rehab setting was sent to ED by MD at [facility].  No head injury or symptoms of injury on neck.  Pt is reporting diarrhea, feeling weak, low platelets, low BP.  PMHx:  mult myeloma x 10 years, chemotherapy,  Clinical Impression  Pt was seen for mobility on side of bed with orthostatics checked:  supine 120/74 (86), sitting 123/59 (78), standing 86/60 (70) with pulses 77-82 and sats 100%.  Recommend him to SNF care return as pt began to fall as soon as he returned home with wife, and is at high risk for injury.  Pt is going to be treated for orthostasis and weakness, and will see pt 3x per week in case his SNF request is declined.  Follow acutely for goals of PT as outlined below.       Recommendations for follow up therapy are one component of a multi-disciplinary discharge planning process, led by the attending physician.  Recommendations may be updated based on patient status, additional functional criteria and insurance authorization.  Follow Up Recommendations Skilled nursing-short term rehab (<3 hours/day) Can patient physically be transported by private vehicle: No    Assistance Recommended at Discharge Frequent or constant Supervision/Assistance  Patient can return home with the following  A lot of help with walking and/or transfers;A lot of help with bathing/dressing/bathroom;Direct supervision/assist for medications management;Direct supervision/assist for financial management;Assist for transportation;Help with stairs or ramp for entrance    Equipment Recommendations None recommended by PT  Recommendations for Other Services       Functional Status Assessment Patient has had a recent decline in their functional status and demonstrates the  ability to make significant improvements in function in a reasonable and predictable amount of time.     Precautions / Restrictions Precautions Precautions: Fall Precaution Comments: mult recent falls at home Restrictions Weight Bearing Restrictions: No      Mobility  Bed Mobility Overal bed mobility: Needs Assistance Bed Mobility: Supine to Sit, Sit to Supine     Supine to sit: Min assist Sit to supine: Mod assist   General bed mobility comments: mod assist and max to scoot up in bed    Transfers Overall transfer level: Needs assistance Equipment used: Rolling walker (2 wheels), 1 person hand held assist Transfers: Sit to/from Stand Sit to Stand: Min assist           General transfer comment: min assist to control initial balance    Ambulation/Gait               General Gait Details: deferred due to BP being too orthostatic  Stairs            Wheelchair Mobility    Modified Rankin (Stroke Patients Only)       Balance Overall balance assessment: Needs assistance, History of Falls Sitting-balance support: Feet supported Sitting balance-Leahy Scale: Fair     Standing balance support: Bilateral upper extremity supported, During functional activity Standing balance-Leahy Scale: Poor Standing balance comment: pt requires support to stand up                             Pertinent Vitals/Pain Pain Assessment Pain Assessment: Faces Faces Pain Scale: No hurt    Home Living  Family/patient expects to be discharged to:: Skilled nursing facility                   Additional Comments: pt had just left rehab when falls began to occur at home    Prior Function Prior Level of Function : Needs assist       Physical Assist : Mobility (physical) Mobility (physical): Gait   Mobility Comments: RW at home with wife supervising       Hand Dominance   Dominant Hand: Right    Extremity/Trunk Assessment   Upper Extremity  Assessment Upper Extremity Assessment: Generalized weakness    Lower Extremity Assessment Lower Extremity Assessment: Generalized weakness    Cervical / Trunk Assessment Cervical / Trunk Assessment: Kyphotic (mild)  Communication   Communication: No difficulties  Cognition Arousal/Alertness: Awake/alert Behavior During Therapy: Flat affect Overall Cognitive Status: History of cognitive impairments - at baseline                                 General Comments: hx of dementia        General Comments General comments (skin integrity, edema, etc.): Pt was seen for BP and vitals ck to move, no symtptoms of dizziness but BP steadily dropped while moving out of bed    Exercises     Assessment/Plan    PT Assessment    PT Problem List         PT Treatment Interventions      PT Goals (Current goals can be found in the Care Plan section)  Acute Rehab PT Goals Patient Stated Goal: to walk again PT Goal Formulation: With patient/family Time For Goal Achievement: 11/13/22 Potential to Achieve Goals: Good    Frequency       Co-evaluation               AM-PAC PT "6 Clicks" Mobility  Outcome Measure Help needed turning from your back to your side while in a flat bed without using bedrails?: A Little Help needed moving from lying on your back to sitting on the side of a flat bed without using bedrails?: A Little Help needed moving to and from a bed to a chair (including a wheelchair)?: A Little Help needed standing up from a chair using your arms (e.g., wheelchair or bedside chair)?: A Little Help needed to walk in hospital room?: A Lot Help needed climbing 3-5 steps with a railing? : Total 6 Click Score: 15    End of Session              Time: [id number] PT Time Calculation (min) (ACUTE ONLY): 24 min   Charges:   PT Evaluation $PT Eval Moderate Complexity: 1 Mod PT Treatments $Therapeutic Activity: 8-22 mins       Ramond Dial 10/30/2022, 12:51 PM  Mee Hives, PT PhD Acute Rehab Dept. Number: [place] [facility] [place]

## 2022-10-30 NOTE — TOC Initial Note (Signed)
Transition of Care [facility]) - Initial/Assessment Note    Patient Details  Name: Andre [name] MRN: [id number] Date of Birth: 1951/08/18  Transition of Care [facility]) CM/SW Contact:    Raina Mina, [place] Phone Number: 10/30/2022, 11:26 AM  Clinical Narrative:  CSW met with patient and his daughter Dronen at bedside. Patients wife was present via phone for the initial assessment. CSW explained patients insurance (Medicare A/[place]) and that due to patient being in the emergency room and not admitted on the floor that he will be lmited to SNF facilities. CSW told family rhat patient has [facility] services and qualifies for the [place]. CSW provided the family with the list of SNF's patient can chose from. Family stated they want [facility] or [facility]. CSW sent out referrals to all 14 facilities of on the wavier. Patients full name is Andre [name].             Expected Discharge Plan: [place] Barriers to Discharge: Continued Medical Work up   Patient Goals and CMS Choice Patient states their goals for this hospitalization and ongoing recovery are:: Return home          Expected Discharge Plan and Services       Living arrangements for the past 2 months: Single Family Home                                      Prior Living Arrangements/Services Living arrangements for the past 2 months: Single Family Home Lives with:: Spouse Patient language and need for interpreter reviewed:: Yes        Need for Family Participation in Patient Care: Yes (Comment) Care giver support system in place?: Yes (comment)   Criminal Activity/Legal Involvement Pertinent to Current Situation/Hospitalization: No - Comment as needed  Activities of Daily Living      Permission Sought/Granted      Share Information with NAME: [name] Alpern     Permission granted to share info w Relationship: Wife  Permission granted to share info w Contact Information: [phone]  Emotional  Assessment Appearance:: Appears stated age Attitude/Demeanor/Rapport: Engaged Affect (typically observed): Calm Orientation: : Oriented to Self, Oriented to Place, Oriented to  Time, Oriented to Situation Alcohol / Substance Use: Not Applicable Psych Involvement: No (comment)  Admission diagnosis:  Loose Stool Patient Active Problem List   Diagnosis Date Noted   Meteorism 02/09/2022   Memory problem 08/30/2021   Accidental fall 02/19/2021   Periorbital contusion of right eye 02/19/2021   Contusion of right hand 02/19/2021   Hemorrhoids 11/20/2019   Neuropathy due to chemotherapeutic drug ([place]) 09/01/2019   Leukopenia due to antineoplastic chemotherapy ([place]) 02/22/2017   Dry skin dermatitis 08/16/2016   Peripheral edema 04/26/2016   Bone marrow transplant status ([place]) 03/14/2016   Debility 02/26/2016   Back pain 02/26/2016   Febrile neutropenia ([place]) 02/15/2016   Patient in clinical research study 01/10/2016   Rash and nonspecific skin eruption 01/06/2016   HCAP (healthcare-associated pneumonia)    GERD (gastroesophageal reflux disease)    Dehydration 12/22/2015   Weakness 12/22/2015   Asthma exacerbation 12/22/2015   Hyponatremia 12/22/2015   General weakness    Multiple myeloma not having achieved remission ([place])    Sepsis ([place])    Chronic anemia    SBO (small bowel obstruction) ([place]) 11/07/2015   Constipation    Actinic keratosis 07/08/2015   Transient global amnesia 06/07/2015  Diarrhea 09/17/2014   Raynaud's phenomenon 09/17/2013   Hemorrhage of rectum and anus 09/15/2013   Skin lesion, superficial 07/29/2013   Cough 01/10/2013   Abnormal brain MRI 10/08/2012   Vitamin D deficiency 09/30/2012   Hypothyroidism 09/30/2012   Hypogonadism male 09/30/2012   Fatigue 09/27/2012   Insomnia 09/27/2012   Reactive depression (situational) 09/27/2012   Goals of care, counseling/discussion 09/27/2012   TIA (transient ischemic attack) 03/27/2012   Transient diplopia  03/27/2012   HYPERLIPIDEMIA 05/30/2010   ANEMIA 05/30/2010   Asthma 05/30/2010   SCHATZKI'S RING 05/30/2010   Peptic ulcer disease 05/30/2010   HIATAL HERNIA 05/30/2010   SLEEP APNEA, MILD 05/30/2010   TRANSAMINASES, SERUM, ELEVATED 05/30/2010   PCP:  Cassandria Anger, MD Pharmacy:   [facility] [place], [place] - [address] AT [facility] [address] & [place] [address] Phone: [phone] Fax: [place] [address] Phone: [phone] Fax: [place] [address] Phone: [phone] Fax: [phone]     Social Determinants of Health (SDOH) Social History: SDOH Screenings   Depression (PHQ2-9): Low Risk  (06/08/2022)  Tobacco Use: Medium Risk (09/06/2022)   SDOH Interventions:     Readmission Risk Interventions     No data to display

## 2022-10-30 NOTE — ED Provider Notes (Signed)
[facility] Provider Note   CSN: [id number] Arrival date & time: 10/29/22  2032     History  Chief Complaint  Patient presents with   Diarrhea    Andre [name] is a 72 y.o. male.  Patient is a 72 year old male with a past medical history of multiple myeloma on chemotherapy and recently discharged from rehab presenting to the emergency department with generalized weakness.  Patient lives at home with his wife though she has multiple medical conditions and is unable to help care for him at home.  He has been using a walker to get around but states that he did have a fall yesterday due to feeling weak.  He denies hitting his head or losing consciousness.  He denies any blood thinner use but does have known thrombocytopenia and states that his platelet count is around 20.  He also reports that he has been having about 1 episode of diarrhea per day.  He states he has had a decreased appetite but has been drinking protein drinks daily.  He denies any nausea, vomiting or abdominal pain.  He denies any fevers or chills, cough or congestion.  He states that he was treated for pneumonia last month.  The history is provided by the patient and a relative.  Diarrhea      Home Medications Prior to Admission medications   Medication Sig Start Date End Date Taking? Authorizing Provider  calcium-vitamin D (OSCAL WITH D) 500-200 MG-UNIT tablet Take 1 tablet by mouth daily. 08/14/22   [name], Evie Lacks, MD  cetirizine (ZYRTEC) 10 MG tablet Take 10 mg by mouth daily.    [provider]  Cholecalciferol (VITAMIN D3) 2000 units capsule Take 1 capsule (2,000 Units total) by mouth daily. 08/17/17   [name], Evie Lacks, MD  COVID-19 mRNA vaccine [phone] (COMIRNATY) syringe Inject into the muscle. Patient not taking: Reported on 07/12/2022 07/05/22     dexamethasone (DECADRON) 4 MG tablet Take 2.5 tablets (10 mg total) by mouth as directed. Day of  treatment and Day after treatment as of 5/22 09/20/21   Ladell Pier, MD  diphenhydrAMINE (BENADRYL) 25 mg capsule Take 2 capsules (50 mg total) by mouth every 6 (six) hours as needed. Takes 2 benadryl at bedtime for itching 09/20/21   Ladell Pier, MD  docusate sodium (COLACE) 100 MG capsule Take 100 mg by mouth daily as needed for mild constipation or moderate constipation.    [provider]  escitalopram (LEXAPRO) 10 MG tablet Take 1 tablet (10 mg total) by mouth daily. 06/27/22   [name], Evie Lacks, MD  HYDROCORTISONE ACE, RECTAL, 30 MG SUPP 1 pr bid x 7 d prn Patient not taking: Reported on 06/19/2022 11/20/19   [name], Evie Lacks, MD  hydrocortisone valerate cream (WESTCORT) 0.2 % Apply 1 application topically 2 (two) times daily. 01/25/21   Tanner, Lyndon Code., PA-C  levothyroxine (SYNTHROID) 150 MCG tablet TAKE 1 TABLET BY MOUTH EVERY MORNING ON AN EMPTY STOMACH 02/09/22   [name], Evie Lacks, MD  LORazepam (ATIVAN) 0.5 MG tablet Take 1 tablet (0.5 mg total) by mouth at bedtime as needed for anxiety. 08/16/22   [name], Evie Lacks, MD  ondansetron (ZOFRAN) 8 MG tablet Take 1 tablet (8 mg total) by mouth 1 hour prior to oral cytoxan and every 8 hours as needed for nausea Patient not taking: Reported on 06/19/2022 02/09/22   [name], Evie Lacks, MD  pantoprazole (PROTONIX) 40 MG tablet TAKE 1 TABLET  BY MOUTH TWICE DAILY BEFORE MEALS 02/09/22   [name], Evie Lacks, MD  triamcinolone ointment (KENALOG) 0.1 % Apply to affected area topically 2 (two) times daily. 08/16/22   [name], Evie Lacks, MD  valACYclovir (VALTREX) 1000 MG tablet Take 1 tablet (1,000 mg total) by mouth daily. 02/09/22   [name], Evie Lacks, MD  prochlorperazine (COMPAZINE) 10 MG tablet Take 1 tablet (10 mg total) by mouth every 6 (six) hours as needed (Nausea or vomiting). 12/14/20 12/12/21  Magrinat, [name], MD      Allergies    Atorvastatin, Rosuvastatin, Crestor [rosuvastatin calcium], Lipitor [atorvastatin  calcium], and Septra [sulfamethoxazole-trimethoprim]    Review of Systems   Review of Systems  Gastrointestinal:  Positive for diarrhea.    Physical Exam Updated Vital Signs BP 120/61   Pulse 79   Temp 98.3 F (36.8 C) (Oral)   Resp 18   SpO2 95%  Physical Exam Vitals and nursing note reviewed.  Constitutional:      General: He is not in acute distress.    Appearance: Normal appearance.  HENT:     Head: Normocephalic and atraumatic.     Nose: Nose normal.     Mouth/Throat:     Pharynx: Oropharynx is clear.     Comments: Mildly dry mucous membranes Eyes:     Extraocular Movements: Extraocular movements intact.     Conjunctiva/sclera: Conjunctivae normal.     Pupils: Pupils are equal, round, and reactive to light.  Neck:     Comments: No midline neck tenderness Cardiovascular:     Rate and Rhythm: Normal rate and regular rhythm.     Pulses: Normal pulses.     Heart sounds: Normal heart sounds.  Pulmonary:     Effort: Pulmonary effort is normal.     Breath sounds: Normal breath sounds.  Abdominal:     General: Abdomen is flat.     Palpations: Abdomen is soft.     Tenderness: There is no abdominal tenderness.  Musculoskeletal:        General: Normal range of motion.     Cervical back: Normal range of motion and neck supple.     Right lower leg: No edema.     Left lower leg: No edema.     Comments: No midline back tenderness Pelvis stable, nontender No tenderness to palpation of bilateral upper and lower extremities  Skin:    General: Skin is warm and dry.     Findings: No bruising.  Neurological:     General: No focal deficit present.     Mental Status: He is alert and oriented to person, place, and time.     Cranial Nerves: No cranial nerve deficit.     Sensory: No sensory deficit.     Motor: No weakness.     Coordination: Coordination normal.  Psychiatric:        Mood and Affect: Mood normal.        Behavior: Behavior normal.     ED Results /  Procedures / Treatments   Labs (all labs ordered are listed, but only abnormal results are displayed) Labs Reviewed  LIPASE, BLOOD - Abnormal; Notable for the following components:      Result Value   Lipase 57 (*)    All other components within normal limits  COMPREHENSIVE METABOLIC PANEL - Abnormal; Notable for the following components:   CO2 21 (*)    Glucose, Bld 102 (*)    BUN 37 (*)    Total Protein  5.7 (*)    All other components within normal limits  CBC - Abnormal; Notable for the following components:   WBC 3.7 (*)    RBC 2.41 (*)    Hemoglobin 8.6 (*)    HCT 24.4 (*)    MCV 101.2 (*)    MCH 35.7 (*)    RDW 20.9 (*)    Platelets 26 (*)    All other components within normal limits  DIFFERENTIAL - Abnormal; Notable for the following components:   Lymphs Abs 0.4 (*)    All other components within normal limits  URINALYSIS, ROUTINE W REFLEX MICROSCOPIC    EKG EKG Interpretation  Date/Time:  Sunday October 29 2022 21:34:32 EST Ventricular Rate:  74 PR Interval:  172 QRS Duration: 102 QT Interval:  400 QTC Calculation: 444 R Axis:   -61 Text Interpretation: Sinus rhythm Left anterior fascicular block No significant change since last tracing Confirmed by Leanord Asal ([id number]) on 10/29/2022 10:36:40 PM  Radiology CT Head Wo Contrast  Result Date: 10/29/2022 CLINICAL DATA:  Head trauma, minor (Age >= 65y); Neck trauma (Age >= 65y). Pts Wife states that he has been some weakness, cognitive disability, reduced mobility and blood pressure changes since his diagnosis. Patient's family states that he has had this since a heavy chemo treatment on January 6th. EXAM: CT HEAD WITHOUT CONTRAST CT CERVICAL SPINE WITHOUT CONTRAST TECHNIQUE: Multidetector CT imaging of the head and cervical spine was performed following the standard protocol without intravenous contrast. Multiplanar CT image reconstructions of the cervical spine were also generated. RADIATION DOSE REDUCTION: This  exam was performed according to the departmental dose-optimization program which includes automated exposure control, adjustment of the mA and/or kV according to patient size and/or use of iterative reconstruction technique. COMPARISON:  None Available. FINDINGS: CT HEAD FINDINGS Brain: Patchy and confluent areas of decreased attenuation are noted throughout the deep and periventricular white matter of the cerebral hemispheres bilaterally, compatible with chronic microvascular ischemic disease. No evidence of large-territorial acute infarction. No parenchymal hemorrhage. No mass lesion. No extra-axial collection. No mass effect or midline shift. No hydrocephalus. Basilar cisterns are patent. Vascular: No hyperdense vessel. Atherosclerotic calcifications are present within the cavernous internal carotid and vertebral arteries. Skull: No acute fracture or focal lesion. Sinuses/Orbits: Paranasal sinuses and mastoid air cells are clear. The orbits are unremarkable. Other: None. CT CERVICAL SPINE FINDINGS Alignment: Normal. Skull base and vertebrae: Degenerative changes at the C5-C6 level. Associated moderate severe left osseous neural foraminal stenosis. No severe osseous central canal stenosis. No acute fracture. No aggressive appearing focal osseous lesion or focal pathologic process. Soft tissues and spinal canal: No prevertebral fluid or swelling. No visible canal hematoma. Upper chest: Unremarkable. Other: None. IMPRESSION: 1. No acute intracranial abnormality. 2. No acute displaced fracture or traumatic listhesis of the cervical spine. 3. Degenerative changes at the C5-C6 level. Associated moderate - severe left osseous neural foraminal stenosis. Electronically Signed   By: Iven Finn M.D.   On: 10/29/2022 22:30   CT Cervical Spine Wo Contrast  Result Date: 10/29/2022 CLINICAL DATA:  Head trauma, minor (Age >= 65y); Neck trauma (Age >= 65y). Pts Wife states that he has been some weakness, cognitive  disability, reduced mobility and blood pressure changes since his diagnosis. Patient's family states that he has had this since a heavy chemo treatment on January 6th. EXAM: CT HEAD WITHOUT CONTRAST CT CERVICAL SPINE WITHOUT CONTRAST TECHNIQUE: Multidetector CT imaging of the head and cervical spine was performed following the standard  protocol without intravenous contrast. Multiplanar CT image reconstructions of the cervical spine were also generated. RADIATION DOSE REDUCTION: This exam was performed according to the departmental dose-optimization program which includes automated exposure control, adjustment of the mA and/or kV according to patient size and/or use of iterative reconstruction technique. COMPARISON:  None Available. FINDINGS: CT HEAD FINDINGS Brain: Patchy and confluent areas of decreased attenuation are noted throughout the deep and periventricular white matter of the cerebral hemispheres bilaterally, compatible with chronic microvascular ischemic disease. No evidence of large-territorial acute infarction. No parenchymal hemorrhage. No mass lesion. No extra-axial collection. No mass effect or midline shift. No hydrocephalus. Basilar cisterns are patent. Vascular: No hyperdense vessel. Atherosclerotic calcifications are present within the cavernous internal carotid and vertebral arteries. Skull: No acute fracture or focal lesion. Sinuses/Orbits: Paranasal sinuses and mastoid air cells are clear. The orbits are unremarkable. Other: None. CT CERVICAL SPINE FINDINGS Alignment: Normal. Skull base and vertebrae: Degenerative changes at the C5-C6 level. Associated moderate severe left osseous neural foraminal stenosis. No severe osseous central canal stenosis. No acute fracture. No aggressive appearing focal osseous lesion or focal pathologic process. Soft tissues and spinal canal: No prevertebral fluid or swelling. No visible canal hematoma. Upper chest: Unremarkable. Other: None. IMPRESSION: 1. No acute  intracranial abnormality. 2. No acute displaced fracture or traumatic listhesis of the cervical spine. 3. Degenerative changes at the C5-C6 level. Associated moderate - severe left osseous neural foraminal stenosis. Electronically Signed   By: Iven Finn M.D.   On: 10/29/2022 22:30   DG Chest 1 View  Result Date: 10/29/2022 CLINICAL DATA:  Weakness and diarrhea EXAM: CHEST  1 VIEW COMPARISON:  Chest x-ray 06/15/2020.  CT chest 12/23/2015. FINDINGS: There is a new rounded density projecting over the cardiac silhouette with air-fluid level favored as large hiatal hernia. The lungs are otherwise clear. There is no pleural effusion or pneumothorax. Cardiomediastinal silhouette is otherwise within normal limits. There are healed right-sided rib fractures. IMPRESSION: 1. New rounded density projecting over the cardiac silhouette with air-fluid level favored as large hiatal hernia. Please correlate clinically. 2.  The lungs are otherwise clear. Electronically Signed   By: Ronney Asters M.D.   On: 10/29/2022 22:27    Procedures Procedures    Medications Ordered in ED Medications  lactated ringers bolus 1,000 mL (1,000 mLs Intravenous New Bag/Given 10/29/22 2118)    ED Course/ Medical Decision Making/ A&P Clinical Course as of 10/30/22 0027  Sun Oct 29, 2022  2149 Patient's labs appear to be at baseline. CT imaging pending. [VK]  2358 Patient feels too weak to be able to care for himself at home and concern to be a fall risk.  Patient will be transferred to [facility] ED for PT/TOC eval for possible SNF placement.  He was accepted for transfer by Dr. Leonette Monarch. [VK]    Clinical Course User Index [VK] Kemper Durie, DO                             Medical Decision Making This patient presents to the ED with chief complaint(s) of weakness with pertinent past medical history of multiple myeloma which further complicates the presenting complaint. The complaint involves an extensive differential  diagnosis and also carries with it a high risk of complications and morbidity.    The differential diagnosis includes due to patient's fall and thrombocytopenia concerning for ICH, mass effect, cervical spine fracture, no other traumatic injury seen on exam,  patient has no focal neurologic deficits making CVA unlikely, considering electrolyte abnormality, dehydration, infection, C. difficile unlikely as patient is only having 1 episode of diarrhea per day  Additional history obtained: Additional history obtained from family Records reviewed previous admission documents  ED Course and Reassessment: Patient will have EKG, labs and urine performed, CT head and C-spine will be performed to evaluate for traumatic injury and chest x-ray to evaluate for possible infectious etiology.  He will be given IV fluids and will be closely reassessed.  Independent labs interpretation:  The following labs were independently interpreted: At patient's baseline, no acute abnormalities  Independent visualization of imaging: - I independently visualized the following imaging with scope of interpretation limited to determining acute life threatening conditions related to emergency care: Head CT/C-spine, chest x-ray, which revealed no acute disease     Amount and/or Complexity of Data Reviewed Labs: ordered. Radiology: ordered.           Final Clinical Impression(s) / ED Diagnoses Final diagnoses:  Generalized weakness  Frequent falls  Multiple myeloma not having achieved remission (HCC)  Thrombocytopenia [facility])    Rx / DC Orders ED Discharge Orders     None         Kemper Durie, DO 10/30/22 [phone]

## 2022-10-30 NOTE — ED Provider Notes (Signed)
I assumed care of this patient.  Please see previous provider note for further details of Hx, PE.  Briefly patient is a 72 y.o. male who has a history of multiple myeloma currently on chemotherapy who presented to [facility] for generalized fatigue.  Workup was reassuring without acute changes.  Patient was transferred for [facility] evaluation for possible SNF placement.  Home medications ordered.  TOC and physical therapy consults placed. Of note, patient is supposed to have home health evaluation sometime this week in case he is not able to be placed.      Fatima Blank, MD 10/30/22 [phone]

## 2022-10-30 NOTE — ED Notes (Signed)
Report called to Gerald Stabs, RN, CN at [facility].  [facility] at bedside

## 2022-10-30 NOTE — ED Notes (Signed)
Pt transferred from [facility] ED via [facility] for PT/OT evaluation d/t weakness. Pt initially arrived to [facility] for diarrhea and increased weakness. Pt AO4, VSS, no c/o pain, 20g L upper arm.

## 2022-10-30 NOTE — Telephone Encounter (Signed)
Mrs. Andre [name] called to be sure MD was aware that husband was in the ER at [facility]. Reassured her that Dr. Benay Spice is aware. She is asking for our assistance in getting him admitted or sent to SNF. Informed her that this is managed by the hospitalist and CSW. Dr. Benay Spice will round on him if he is admitted. Mrs. Andre [name] does not think she can take care of him at home.

## 2022-10-30 NOTE — ED Notes (Addendum)
Patient is alert and oriented.  Skin is warm and dry.  Patient denies pain.  He has had loose bm, dark brown in color.  Patient skin remains intact  patient has a bandaid to the right chest, post port removal.  Family updated on plan to transfer.  Awaiting transport team at this time

## 2022-10-30 NOTE — NC FL2 (Signed)
[place] LEVEL OF CARE FORM     IDENTIFICATION  Patient Name: Andre [name] Birthdate: [birth year] Sex: male Admission Date (Current Location): 10/29/2022  [facility] and [place] Number:  Herbalist and Address:  [facility] [place], [place] [address] [place], [place] [address]      Provider Number: [id number]  Attending Physician Name and Address:  Default, Provider, MD  Relative Name and Phone Number:  Nodarse,[name] (Spouse) [phone]    Current Level of Care: Hospital Recommended Level of Care: [place] Prior Approval Number:    Date Approved/Denied:   PASRR Number: [id number]  Discharge Plan: SNF    Current Diagnoses: Patient Active Problem List   Diagnosis Date Noted   Meteorism 02/09/2022   Memory problem 08/30/2021   Accidental fall 02/19/2021   Periorbital contusion of right eye 02/19/2021   Contusion of right hand 02/19/2021   Hemorrhoids 11/20/2019   Neuropathy due to chemotherapeutic drug ([place]) 09/01/2019   Leukopenia due to antineoplastic chemotherapy ([place]) 02/22/2017   Dry skin dermatitis 08/16/2016   Peripheral edema 04/26/2016   Bone marrow transplant status ([place]) 03/14/2016   Debility 02/26/2016   Back pain 02/26/2016   Febrile neutropenia ([place]) 02/15/2016   Patient in clinical research study 01/10/2016   Rash and nonspecific skin eruption 01/06/2016   HCAP (healthcare-associated pneumonia)    GERD (gastroesophageal reflux disease)    Dehydration 12/22/2015   Weakness 12/22/2015   Asthma exacerbation 12/22/2015   Hyponatremia 12/22/2015   General weakness    Multiple myeloma not having achieved remission (HCC)    Sepsis (HCC)    Chronic anemia    SBO (small bowel obstruction) ([place]) 11/07/2015   Constipation    Actinic keratosis 07/08/2015   Transient global amnesia 06/07/2015   Diarrhea 09/17/2014   Raynaud's phenomenon 09/17/2013   Hemorrhage of rectum and anus 09/15/2013    Skin lesion, superficial 07/29/2013   Cough 01/10/2013   Abnormal brain MRI 10/08/2012   Vitamin D deficiency 09/30/2012   Hypothyroidism 09/30/2012   Hypogonadism male 09/30/2012   Fatigue 09/27/2012   Insomnia 09/27/2012   Reactive depression (situational) 09/27/2012   Goals of care, counseling/discussion 09/27/2012   TIA (transient ischemic attack) 03/27/2012   Transient diplopia 03/27/2012   HYPERLIPIDEMIA 05/30/2010   ANEMIA 05/30/2010   Asthma 05/30/2010   SCHATZKI'S RING 05/30/2010   Peptic ulcer disease 05/30/2010   HIATAL HERNIA 05/30/2010   SLEEP APNEA, MILD 05/30/2010   TRANSAMINASES, SERUM, ELEVATED 05/30/2010    Orientation RESPIRATION BLADDER Height & Weight     Self, Time, Situation, Place  Normal Incontinent Weight: 184 lbs  Height:   5'9  BEHAVIORAL SYMPTOMS/MOOD NEUROLOGICAL BOWEL NUTRITION STATUS      Incontinent Diet (Vegetarian/Pescetarian)  AMBULATORY STATUS COMMUNICATION OF NEEDS Skin   Extensive Assist Verbally Normal                       Personal Care Assistance Level of Assistance  Bathing, Feeding, Dressing Bathing Assistance: Maximum assistance Feeding assistance: Independent Dressing Assistance: Maximum assistance     Functional Limitations Info  Sight, Hearing, Speech Sight Info: Adequate Hearing Info: Adequate Speech Info: Adequate    SPECIAL CARE FACTORS FREQUENCY                       Contractures Contractures Info: Not present    Additional Factors Info  Code Status, Allergies Code Status Info: Full code Allergies Info: Septra (  sulfamethoxazole-trimethoprim), Lipitor (atorvastatin Calcium), Crestor (rosuvastatin Calcium), Rosuvastatin, Atorvastatin           Current Medications (10/30/2022):  This is the current hospital active medication list Current Facility-Administered Medications  Medication Dose Route Frequency Provider Last Rate Last Admin   calcium-vitamin D (OSCAL WITH D) 500-5 MG-MCG per tablet 1  tablet  1 tablet Oral QAC supper Cardama, Grayce Sessions, MD       escitalopram (LEXAPRO) tablet 10 mg  10 mg Oral Daily Cardama, Grayce Sessions, MD   10 mg at 10/30/22 1017   levothyroxine (SYNTHROID) tablet 150 mcg  150 mcg Oral Daily Fatima Blank, MD   150 mcg at 10/30/22 0616   LORazepam (ATIVAN) tablet 0.5 mg  0.5 mg Oral QHS PRN Cardama, Grayce Sessions, MD       pantoprazole (PROTONIX) EC tablet 40 mg  40 mg Oral BID AC Cardama, Grayce Sessions, MD   40 mg at 10/30/22 [id number]   Current Outpatient Medications  Medication Sig Dispense Refill   calcium-vitamin D (OSCAL WITH D) 500-200 MG-UNIT tablet Take 1 tablet by mouth daily. 90 tablet 3   cetirizine (ZYRTEC) 10 MG tablet Take 10 mg by mouth daily.     Cholecalciferol (VITAMIN D3) 2000 units capsule Take 1 capsule (2,000 Units total) by mouth daily. 100 capsule 3   COVID-19 mRNA vaccine 2023-2024 (COMIRNATY) syringe Inject into the muscle. (Patient not taking: Reported on 07/12/2022) 0.3 mL 0   dexamethasone (DECADRON) 4 MG tablet Take 2.5 tablets (10 mg total) by mouth as directed. Day of treatment and Day after treatment as of 5/22 5 tablet 3   diphenhydrAMINE (BENADRYL) 25 mg capsule Take 2 capsules (50 mg total) by mouth every 6 (six) hours as needed. Takes 2 benadryl at bedtime for itching 30 capsule 0   docusate sodium (COLACE) 100 MG capsule Take 100 mg by mouth daily as needed for mild constipation or moderate constipation.     escitalopram (LEXAPRO) 10 MG tablet Take 1 tablet (10 mg total) by mouth daily. 90 tablet 1   HYDROCORTISONE ACE, RECTAL, 30 MG SUPP 1 pr bid x 7 d prn (Patient not taking: Reported on 06/19/2022) 14 suppository 3   hydrocortisone valerate cream (WESTCORT) 0.2 % Apply 1 application topically 2 (two) times daily. 60 g 2   levothyroxine (SYNTHROID) 150 MCG tablet TAKE 1 TABLET BY MOUTH EVERY MORNING ON AN EMPTY STOMACH 90 tablet 3   LORazepam (ATIVAN) 0.5 MG tablet Take 1 tablet (0.5 mg total) by mouth at  bedtime as needed for anxiety. 60 tablet 1   ondansetron (ZOFRAN) 8 MG tablet Take 1 tablet (8 mg total) by mouth 1 hour prior to oral cytoxan and every 8 hours as needed for nausea (Patient not taking: Reported on 06/19/2022) 30 tablet 1   pantoprazole (PROTONIX) 40 MG tablet TAKE 1 TABLET BY MOUTH TWICE DAILY BEFORE MEALS 180 tablet 3   triamcinolone ointment (KENALOG) 0.1 % Apply to affected area topically 2 (two) times daily. 80 g 2   valACYclovir (VALTREX) 1000 MG tablet Take 1 tablet (1,000 mg total) by mouth daily. 90 tablet 3     Discharge Medications: Please see discharge summary for a list of discharge medications.  Relevant Imaging Results:  Relevant Lab Results:   Additional Information SSN: [id number]    (patient is currently not having chemo or radiation,  Raina Mina, LCSWA

## 2022-10-31 ENCOUNTER — Other Ambulatory Visit: Payer: Self-pay | Admitting: *Deleted

## 2022-10-31 ENCOUNTER — Telehealth: Payer: Self-pay | Admitting: *Deleted

## 2022-10-31 ENCOUNTER — Inpatient Hospital Stay: Payer: Medicare Other | Admitting: Oncology

## 2022-10-31 ENCOUNTER — Inpatient Hospital Stay: Payer: Medicare Other

## 2022-10-31 DIAGNOSIS — R627 Adult failure to thrive: Secondary | ICD-10-CM | POA: Diagnosis not present

## 2022-10-31 DIAGNOSIS — C9000 Multiple myeloma not having achieved remission: Secondary | ICD-10-CM

## 2022-10-31 DIAGNOSIS — T451X5A Adverse effect of antineoplastic and immunosuppressive drugs, initial encounter: Secondary | ICD-10-CM | POA: Diagnosis not present

## 2022-10-31 DIAGNOSIS — D61810 Antineoplastic chemotherapy induced pancytopenia: Secondary | ICD-10-CM | POA: Diagnosis not present

## 2022-10-31 DIAGNOSIS — R197 Diarrhea, unspecified: Secondary | ICD-10-CM | POA: Diagnosis not present

## 2022-10-31 MED ORDER — LORAZEPAM 1 MG PO TABS: 0.5000 mg | ORAL_TABLET | Freq: Every evening | ORAL | 0 refills | Status: DC | PRN

## 2022-10-31 NOTE — Progress Notes (Signed)
IP PROGRESS NOTE  Subjective:   Andre [name] is well-known to me with a history of multiple myeloma.  He has been followed by Dr. Melba Coon in [place] for salvage CAR-T therapy. He underwent treatment with CAR-T on 09/26/2022.  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  He completed Decadron therapy on 10/08/2022. He was last seen at [facility] on 10/25/2022.  He is scheduled for a restaging bone marrow biopsy for MRD testing on 11/13/2022.  He has returned home.  Labs in [place] on 10/26/2022 found to hemoglobin 8.7, WBC 1.61, platelets 41,000, and ANC 0.6.  Immunofixation on 10/25/2022 confirmed an IgG lambda monoclonal protein with a 0.3 g M spike.  The lambda light chains returned in the normal range at 5.84.  Andre [name] presented to the emergency room yesterday with generalized weakness.  He had a fall at home.  He reports frequent bowel movements with rectal urgency and incontinence.  A CT head and CT cervical spine revealed no acute change.  The bedside RN reports his stool is formed.  Objective: Vital signs in last 24 hours: Blood pressure (!) 151/71, pulse 69, temperature 98.1 F (36.7 C), temperature source Oral, resp. rate 20, height 5' 7"$  (1.702 m), weight 165 lb (74.8 kg), SpO2 96 %.  Intake/Output from previous day: No intake/output data recorded.  Physical Exam:  HEENT: No thrush or bleeding Lungs: Clear bilaterally, no respiratory distress Cardiac: Regular rate and rhythm Abdomen: Soft and nontender Extremities: No leg edema Neurologic: Alert, follows commands, the motor exam appears intact in the upper and lower extremities bilaterally, oriented to place, year, and diagnosis Skin: Mild erythema at the perineum without skin breakdown   Lab Results: Recent Labs    10/29/22 2042  WBC 3.7*  HGB 8.6*  HCT 24.4*  PLT 26*    BMET Recent Labs    10/29/22 2042  NA 138  K 3.7  CL 106   CO2 21*  GLUCOSE 102*  BUN 37*  CREATININE 1.03  CALCIUM 9.3    No results found for: "CEA1", "CEA", "[id number]", "CA125"  Studies/Results: CT Head Wo Contrast  Result Date: 10/29/2022 CLINICAL DATA:  Head trauma, minor (Age >= 65y); Neck trauma (Age >= 65y). Pts Wife states that he has been some weakness, cognitive disability, reduced mobility and blood pressure changes since his diagnosis. Patient's family states that he has had this since a heavy chemo treatment on January 6th. EXAM: CT HEAD WITHOUT CONTRAST CT CERVICAL SPINE WITHOUT CONTRAST TECHNIQUE: Multidetector CT imaging of the head and cervical spine was performed following the standard protocol without intravenous contrast. Multiplanar CT image reconstructions of the cervical spine were also generated. RADIATION DOSE REDUCTION: This exam was performed according to the departmental dose-optimization program which includes automated exposure control, adjustment of the mA and/or kV according to patient size and/or use of iterative reconstruction technique. COMPARISON:  None Available. FINDINGS: CT HEAD FINDINGS Brain: Patchy and confluent areas of decreased attenuation are noted throughout the deep and periventricular white matter of the cerebral hemispheres bilaterally, compatible with chronic microvascular ischemic disease. No evidence of large-territorial acute infarction. No parenchymal hemorrhage. No mass lesion. No extra-axial collection. No mass effect or midline shift. No hydrocephalus. Basilar cisterns are patent. Vascular: No hyperdense vessel. Atherosclerotic calcifications are present within the cavernous internal carotid and vertebral arteries. Skull: No acute fracture or focal lesion. Sinuses/Orbits: Paranasal sinuses and mastoid air cells are clear. The orbits are unremarkable. Other: None.  CT CERVICAL SPINE FINDINGS Alignment: Normal. Skull base and vertebrae: Degenerative changes at the C5-C6 level. Associated moderate severe  left osseous neural foraminal stenosis. No severe osseous central canal stenosis. No acute fracture. No aggressive appearing focal osseous lesion or focal pathologic process. Soft tissues and spinal canal: No prevertebral fluid or swelling. No visible canal hematoma. Upper chest: Unremarkable. Other: None. IMPRESSION: 1. No acute intracranial abnormality. 2. No acute displaced fracture or traumatic listhesis of the cervical spine. 3. Degenerative changes at the C5-C6 level. Associated moderate - severe left osseous neural foraminal stenosis. Electronically Signed   By: Iven Finn M.D.   On: 10/29/2022 22:30   CT Cervical Spine Wo Contrast  Result Date: 10/29/2022 CLINICAL DATA:  Head trauma, minor (Age >= 65y); Neck trauma (Age >= 65y). Pts Wife states that he has been some weakness, cognitive disability, reduced mobility and blood pressure changes since his diagnosis. Patient's family states that he has had this since a heavy chemo treatment on January 6th. EXAM: CT HEAD WITHOUT CONTRAST CT CERVICAL SPINE WITHOUT CONTRAST TECHNIQUE: Multidetector CT imaging of the head and cervical spine was performed following the standard protocol without intravenous contrast. Multiplanar CT image reconstructions of the cervical spine were also generated. RADIATION DOSE REDUCTION: This exam was performed according to the departmental dose-optimization program which includes automated exposure control, adjustment of the mA and/or kV according to patient size and/or use of iterative reconstruction technique. COMPARISON:  None Available. FINDINGS: CT HEAD FINDINGS Brain: Patchy and confluent areas of decreased attenuation are noted throughout the deep and periventricular white matter of the cerebral hemispheres bilaterally, compatible with chronic microvascular ischemic disease. No evidence of large-territorial acute infarction. No parenchymal hemorrhage. No mass lesion. No extra-axial collection. No mass effect or midline  shift. No hydrocephalus. Basilar cisterns are patent. Vascular: No hyperdense vessel. Atherosclerotic calcifications are present within the cavernous internal carotid and vertebral arteries. Skull: No acute fracture or focal lesion. Sinuses/Orbits: Paranasal sinuses and mastoid air cells are clear. The orbits are unremarkable. Other: None. CT CERVICAL SPINE FINDINGS Alignment: Normal. Skull base and vertebrae: Degenerative changes at the C5-C6 level. Associated moderate severe left osseous neural foraminal stenosis. No severe osseous central canal stenosis. No acute fracture. No aggressive appearing focal osseous lesion or focal pathologic process. Soft tissues and spinal canal: No prevertebral fluid or swelling. No visible canal hematoma. Upper chest: Unremarkable. Other: None. IMPRESSION: 1. No acute intracranial abnormality. 2. No acute displaced fracture or traumatic listhesis of the cervical spine. 3. Degenerative changes at the C5-C6 level. Associated moderate - severe left osseous neural foraminal stenosis. Electronically Signed   By: Iven Finn M.D.   On: 10/29/2022 22:30   DG Chest 1 View  Result Date: 10/29/2022 CLINICAL DATA:  Weakness and diarrhea EXAM: CHEST  1 VIEW COMPARISON:  Chest x-ray 06/15/2020.  CT chest 12/23/2015. FINDINGS: There is a new rounded density projecting over the cardiac silhouette with air-fluid level favored as large hiatal hernia. The lungs are otherwise clear. There is no pleural effusion or pneumothorax. Cardiomediastinal silhouette is otherwise within normal limits. There are healed right-sided rib fractures. IMPRESSION: 1. New rounded density projecting over the cardiac silhouette with air-fluid level favored as large hiatal hernia. Please correlate clinically. 2.  The lungs are otherwise clear. Electronically Signed   By: Ronney Asters M.D.   On: 10/29/2022 22:27    Medications: I have reviewed the patient's current medications.  Assessment/Plan:  Multiple  myeloma- IgG lambda  bortezomib (subcutaneously), lenalidomide, and dexamethasone,  with repeat bone marrow biopsy May of 2012 showing 10% plasmacytosis   (2) high-dose chemotherapy with BCNU and melphalan at [facility], followed by stem cell rescue July of 2012   (3) on zoledronic acid started December of 2012, initially monthly, currently given every 3 months, most recent dose  12/07/2015   (4) low-dose lenalidomide resumed April 2013, interrupted several times.  Resumed again on 02/19/2013, ata dose of 5 mg daily, 21 days on and 7 days off, later further reduced to 7 days on, 7 days off   (5) CNS symptoms and abnormal brain MRI extensively evaluated by neurology with no definitive diagnosis established   (6) rising M spike noted June 2014 but did not meet criteria for carfilzomib study   (7) on lenalidomide 25 mg daily, 14 days on, 7 days off, starting 04/18/2013, interrupted December 2014 because of rash;             (a) resumed January 2015 at 10 mg/ day at 21 days on/ 7 days off             (Ervie) starting 08/18/2014 decreased to 10 mg/ day 14 days on, 7 days off because of cytopenias             (c) as of February 2016 was on 5 mg daily 7 days on 7 days off             (d) lenalidomide discontinued December 2016 with evidence of disease progression   (8) transient global amnesia 05/29/2015, resolved without intervention   (9) starting PVD 10/25/2015 w ASA 325 thromboprophylaxis, valacyclovir prophylaxis, last dose 12/17/2015             (a) pomalidomide 4 mg/d days 1-14             (Eleftherios) bortezomib sQ days 2,5,9,12 of each 21 day cycle             (c) dexamethasome 20 mg two days a week             (d) dexamethasone bortezomib and pomalidomide discontinued late December 2018 with poor tolerance   (10) metapneumovirus pneumonia April 2017             (a) completing course of steroids and week of bactrim mid April 2017   (11) status post second autologous transplant at [facility]  02/04/2016(preparatory regimen melphalan 200 mg/m)             (a) received twelve-month vaccinations 03/14/2017 (DPT, Haemophilus, Pneumovax 13, polio)             ([place]) 14 months injections 05/04/2016 include DTaP, Hib conjugate, HepB energex Ebenezer 20 mcg/ml, Prevnar 13             (c) 24 month vaccines due at [facility] June 2019   (12) maintenance therapy started November 2017, consisting of             (a) bortezomib 1.3 mg/M2 every 14 days, first dose 07/27/2016             (Ulmer) pomalidomide 1 mg days 1-21 Q28 days, started 07/19/2016             (c) zolendronate monthly started 07/27/2016 (previously Q 3 months) however patient unable to tolerate, and changed back to q3 months in April, 2018             (d) Bortezomib changed to monthly as of June 2018 because of tolerance issues, however discontinued after 09/14/2017 dose  because of a rise in his M spike             (e) pomalidomide held after 10/18/2017 in preparation for possible study at [facility] (venetoclax)--never resumed             (f) with numbers actually improved off treatment, resumed every 2-week bortezomib 12/25/2017             (g) changed to every 3-week bortezomib as of 03/17/2019             (h) briefly on weekly treatments times 22 October 2020 due to increase in M spike             (I) maintenance therapy discontinued with evidence of progression   (13) bortezomib/daratumumab/dexamethasone started 12/20/2020.             (a) Decadron dose dropped to 10 mg day of and day following treatment starting May 2022             (Bawi) day 8 bortezomib omitted beginning with the June cycle per patient preference             (C) treatment changed to monthly daratumumab/Velcade/Decadron beginning 06/27/2021-patient decision (14) weekly Velcade/Cytoxan beginning 11/14/2021 (15) changed to Cytoxan/carfilzomib/Decadron 12/19/2021-treatment placed on hold 05/15/2022, carfilzomib 05/24/2022 and 05/31/2022 16.  Leukocyte a pheresis procedure at [facility]  06/15/2022 17.  Treatment resumed with Cytoxan/carfilzomib/Decadron 06/28/2022, last given 09/06/2022 18.  CAR-T 09/26/2022 with Carvykti 19.  CRS and ICANS following CAR-T therapy, status post a Decadron taper completed 10/08/2022 20.  Presentation to the emergency room 10/30/2022 with failure to thrive and a fall  21.  Pancytopenia secondary to multiple myeloma and CAR-T therapy    Andre [name] is now at day 33 following CAR-T therapy.  He has failure to thrive.  He does not appear acutely ill.  No evidence of acute infection or acute neurotoxicity.  I suspect his symptoms are related to deconditioning in the setting of an underlying mild cognitive deficit.  He has persistent cytopenias secondary to CAR-T therapy and underlying multiple myeloma.  I spoke to his daughter by telephone.  She indicates his wife cannot care for him at home in his current condition.  Arrangements have been made for transfer to a skilled nursing facility for general care and physical therapy.  We will schedule outpatient follow-up at the Cancer center.  We will follow the CBC and provide transfusion growth factor support as needed.  He will receive IVIG as needed.  Recommendations: Discharge to skilled nursing facility Physical therapy Continue PCP and viral prophylaxis as recommended by Dr. Philipp Ovens Outpatient follow-up will be scheduled at the Cancer center for later this week Barrier cream to buttocks   LOS: 0 days   Betsy Coder, MD   10/31/2022, 1:39 PM

## 2022-10-31 NOTE — ED Notes (Signed)
[facility] called to [facility]

## 2022-10-31 NOTE — Progress Notes (Signed)
Per Dr. Benay Spice: needs lab/OV on 2/15 or 2/16 in am. High priority scheduling message sent to call [facility] and wife with appointment.

## 2022-10-31 NOTE — ED Notes (Signed)
Called [facility], stated they are able to transport pt to [facility] today.

## 2022-10-31 NOTE — Progress Notes (Addendum)
Patient currently has no bed offers. CSW reached out to [facility] in [place] who is currently reviewing patient. CSW reached back out to [facility], [facility], [facility], [facility], [facility], and [facility].    CSW received a response back from [facility] who denied [facility].

## 2022-10-31 NOTE — ED Provider Notes (Signed)
Social work worked with the patient and patient will be discharged to [facility] rehab facility today.  Patient discharged.   Teressa Lower, MD 10/31/22 1104

## 2022-10-31 NOTE — ED Notes (Signed)
[facility] due in 30-45 minutes

## 2022-10-31 NOTE — Progress Notes (Signed)
Patient discharging to [facility] for SNF placement. Patient going to room  308. Number for report is [phone].

## 2022-10-31 NOTE — Telephone Encounter (Signed)
Notified wife after MD conversation with daughter that the only cream on his med list is a hydrocortisone, but Dr. Benay Spice thinks he would do better with a barrier cream such as Desitin or Zinc Oxide for his bottom. She is very distraught with the fact that he is not being admitted to the hospital and all the SNF are declining him. She said she is not able to care for him at home herself and is asking for help getting him admitted.  Just noted that he has been accepted to [facility]. Dr. Benay Spice notified.

## 2022-11-02 ENCOUNTER — Telehealth: Payer: Self-pay

## 2022-11-02 ENCOUNTER — Telehealth: Payer: Self-pay | Admitting: *Deleted

## 2022-11-02 ENCOUNTER — Other Ambulatory Visit: Payer: Self-pay | Admitting: *Deleted

## 2022-11-02 ENCOUNTER — Ambulatory Visit: Payer: Medicare Other

## 2022-11-02 NOTE — Telephone Encounter (Signed)
[facility] transportation coordinator regarding need for labs on 2/16 due to concern of blood counts dropping. Were able to schedule him for 2 pm on 2/16. Also called Andre [name] with the appointment.

## 2022-11-02 NOTE — Telephone Encounter (Signed)
No answer when called for scheduled AWV. Called home/cell numbers for patient. Left message and call back number. Okay to reschedule.

## 2022-11-02 NOTE — Patient Outreach (Signed)
Mr. Hollobaugh recently admitted to [facility] SNF under [place] SNF waiver. Screening for [place] coordination services as benefit of health plan and Primary Care Provider.   Facility site visit to [facility] skilled nursing facility. Met with Kirstin to make aware writer is following for transition plans.  Met with Mr. Derricks and spouse at bedside to discuss [facility] care coordination services. Provided contact information and [facility] Care Management brochure. Discussed writer will continue to follow during SNF stay.   Marthenia Rolling, MSN, RN,BSN [place] Acute Care Coordinator [phone] (Direct dial)

## 2022-11-03 ENCOUNTER — Inpatient Hospital Stay: Payer: Medicare Other | Attending: Oncology

## 2022-11-03 DIAGNOSIS — R627 Adult failure to thrive: Secondary | ICD-10-CM | POA: Insufficient documentation

## 2022-11-03 DIAGNOSIS — C9000 Multiple myeloma not having achieved remission: Secondary | ICD-10-CM | POA: Insufficient documentation

## 2022-11-03 DIAGNOSIS — D61818 Other pancytopenia: Secondary | ICD-10-CM | POA: Insufficient documentation

## 2022-11-06 ENCOUNTER — Inpatient Hospital Stay: Payer: Medicare Other | Admitting: Internal Medicine

## 2022-11-06 ENCOUNTER — Other Ambulatory Visit: Payer: Medicare Other

## 2022-11-06 ENCOUNTER — Encounter: Payer: Self-pay | Admitting: Nurse Practitioner

## 2022-11-06 ENCOUNTER — Other Ambulatory Visit: Payer: Self-pay | Admitting: *Deleted

## 2022-11-06 ENCOUNTER — Inpatient Hospital Stay: Payer: Medicare Other

## 2022-11-06 ENCOUNTER — Inpatient Hospital Stay (HOSPITAL_BASED_OUTPATIENT_CLINIC_OR_DEPARTMENT_OTHER): Payer: Medicare Other | Admitting: Nurse Practitioner

## 2022-11-06 ENCOUNTER — Encounter: Payer: Self-pay | Admitting: Oncology

## 2022-11-06 VITALS — BP 121/69 | HR 88 | Temp 98.2°F | Resp 18 | Ht 67.0 in | Wt 174.0 lb

## 2022-11-06 DIAGNOSIS — R627 Adult failure to thrive: Secondary | ICD-10-CM | POA: Diagnosis not present

## 2022-11-06 DIAGNOSIS — C9000 Multiple myeloma not having achieved remission: Secondary | ICD-10-CM | POA: Diagnosis not present

## 2022-11-06 DIAGNOSIS — D61818 Other pancytopenia: Secondary | ICD-10-CM | POA: Diagnosis not present

## 2022-11-06 LAB — CMP (CANCER CENTER ONLY)
ALT: 17 U/L (ref 0–44)
AST: 18 U/L (ref 15–41)
Albumin: 3.9 g/dL (ref 3.5–5.0)
Alkaline Phosphatase: 37 U/L — ABNORMAL LOW (ref 38–126)
Anion gap: 10 (ref 5–15)
BUN: 20 mg/dL (ref 8–23)
CO2: 25 mmol/L (ref 22–32)
Calcium: 9 mg/dL (ref 8.9–10.3)
Chloride: 108 mmol/L (ref 98–111)
Creatinine: 0.83 mg/dL (ref 0.61–1.24)
GFR, Estimated: >60 mL/min (ref >60)
Glucose, Bld: 109 mg/dL — ABNORMAL HIGH (ref 70–99)
Potassium: 3.7 mmol/L (ref 3.5–5.1)
Sodium: 143 mmol/L (ref 135–145)
Total Bilirubin: 0.4 mg/dL (ref 0.3–1.2)
Total Protein: 5.4 g/dL — ABNORMAL LOW (ref 6.5–8.1)

## 2022-11-06 LAB — CBC WITH DIFFERENTIAL (CANCER CENTER ONLY)
Abs Immature Granulocytes: 0.01 10*3/uL (ref 0.00–0.07)
Basophils Absolute: 0 10*3/uL (ref 0.0–0.1)
Basophils Relative: 1 %
Eosinophils Absolute: 0.1 10*3/uL (ref 0.0–0.5)
Eosinophils Relative: 3 %
HCT: 24.8 % — ABNORMAL LOW (ref 39.0–52.0)
Hemoglobin: 8.5 g/dL — ABNORMAL LOW (ref 13.0–17.0)
Immature Granulocytes: 1 %
Lymphocytes Relative: 16 %
Lymphs Abs: 0.3 10*3/uL — ABNORMAL LOW (ref 0.7–4.0)
MCH: 35.3 pg — ABNORMAL HIGH (ref 26.0–34.0)
MCHC: 34.3 g/dL (ref 30.0–36.0)
MCV: 102.9 fL — ABNORMAL HIGH (ref 80.0–100.0)
Monocytes Absolute: 0.5 10*3/uL (ref 0.1–1.0)
Monocytes Relative: 28 %
Neutro Abs: 1 10*3/uL — ABNORMAL LOW (ref 1.7–7.7)
Neutrophils Relative %: 51 %
Platelet Count: 28 10*3/uL — ABNORMAL LOW (ref 150–400)
RBC: 2.41 MIL/uL — ABNORMAL LOW (ref 4.22–5.81)
RDW: 21.8 % — ABNORMAL HIGH (ref 11.5–15.5)
WBC Count: 1.9 10*3/uL — ABNORMAL LOW (ref 4.0–10.5)
nRBC: 0 % (ref 0.0–0.2)

## 2022-11-06 LAB — SAMPLE TO BLOOD BANK

## 2022-11-06 NOTE — Progress Notes (Signed)
[place] OFFICE PROGRESS NOTE   Diagnosis: Multiple myeloma  INTERVAL HISTORY:   Andre [name] returns for follow-up.  He was seen in the emergency department 10/30/2022 with generalized weakness, fall at home.  He was discharged to a skilled nursing facility.  He reports transferring to a different nursing facility this past Friday.  He may be starting physical therapy tomorrow.  He denies nausea/vomiting.  Stool is loose but he does not characterize as diarrhea.  Some fecal incontinence.  In general oral intake is good.  He denies fever.  Mild dyspnea on exertion.  No cough.  Objective:  Vital signs in last 24 hours:  Blood pressure 121/69, pulse 88, temperature 98.2 F (36.8 C), temperature source Oral, resp. rate 18, height 5' 7"$  (1.702 m), weight 174 lb (78.9 kg), SpO2 100 %.    HEENT: No thrush or ulcers. Resp: Rales at the right lung base.  Lungs otherwise clear.  No respiratory distress. Cardio: Regular rate and rhythm. GI: Abdomen soft and nontender.  No hepatosplenomegaly. Vascular: No leg edema.  Lab Results:  Lab Results  Component Value Date   WBC 1.9 (L) 11/06/2022   HGB 8.5 (L) 11/06/2022   HCT 24.8 (L) 11/06/2022   MCV 102.9 (H) 11/06/2022   PLT 28 (L) 11/06/2022   NEUTROABS 1.0 (L) 11/06/2022    Imaging:  No results found.  Medications: I have reviewed the patient's current medications.  Assessment/Plan: Multiple myeloma- IgG lambda  bortezomib (subcutaneously), lenalidomide, and dexamethasone, with repeat bone marrow biopsy May of 2012 showing 10% plasmacytosis   (2) high-dose chemotherapy with BCNU and melphalan at [facility], followed by stem cell rescue July of 2012   (3) on zoledronic acid started December of 2012, initially monthly, currently given every 3 months, most recent dose  12/07/2015   (4) low-dose lenalidomide resumed April 2013, interrupted several times.  Resumed again on 02/19/2013, ata dose of 5 mg daily, 21 days on and  7 days off, later further reduced to 7 days on, 7 days off   (5) CNS symptoms and abnormal brain MRI extensively evaluated by neurology with no definitive diagnosis established   (6) rising M spike noted June 2014 but did not meet criteria for carfilzomib study   (7) on lenalidomide 25 mg daily, 14 days on, 7 days off, starting 04/18/2013, interrupted December 2014 because of rash;             (a) resumed January 2015 at 10 mg/ day at 21 days on/ 7 days off             ([place]) starting 08/18/2014 decreased to 10 mg/ day 14 days on, 7 days off because of cytopenias             (c) as of February 2016 was on 5 mg daily 7 days on 7 days off             (d) lenalidomide discontinued December 2016 with evidence of disease progression   (8) transient global amnesia 05/29/2015, resolved without intervention   (9) starting PVD 10/25/2015 w ASA 325 thromboprophylaxis, valacyclovir prophylaxis, last dose 12/17/2015             (a) pomalidomide 4 mg/d days 1-14             ([place]) bortezomib sQ days 2,5,9,12 of each 21 day cycle             (c) dexamethasome 20 mg two days a week             (  d) dexamethasone bortezomib and pomalidomide discontinued late December 2018 with poor tolerance   (10) metapneumovirus pneumonia April 2017             (a) completing course of steroids and week of bactrim mid April 2017   (11) status post second autologous transplant at [facility] 02/04/2016(preparatory regimen melphalan 200 mg/m)             (a) received twelve-month vaccinations 03/14/2017 (DPT, Haemophilus, Pneumovax 13, polio)             ([place]) 14 months injections 05/04/2016 include DTaP, Hib conjugate, HepB energex Thomos 20 mcg/ml, Prevnar 13             (c) 24 month vaccines due at [facility] June 2019   (12) maintenance therapy started November 2017, consisting of             (a) bortezomib 1.3 mg/M2 every 14 days, first dose 07/27/2016             ([place]) pomalidomide 1 mg days 1-21 Q28 days, started 07/19/2016              (c) zolendronate monthly started 07/27/2016 (previously Q 3 months) however patient unable to tolerate, and changed back to q3 months in April, 2018             (d) Bortezomib changed to monthly as of June 2018 because of tolerance issues, however discontinued after 09/14/2017 dose because of a rise in his M spike             (e) pomalidomide held after 10/18/2017 in preparation for possible study at [employer] (venetoclax)--never resumed             (f) with numbers actually improved off treatment, resumed every 2-week bortezomib 12/25/2017             (g) changed to every 3-week bortezomib as of 03/17/2019             (h) briefly on weekly treatments times 22 October 2020 due to increase in M spike             (I) maintenance therapy discontinued with evidence of progression   (13) bortezomib/daratumumab/dexamethasone started 12/20/2020.             (a) Decadron dose dropped to 10 mg day of and day following treatment starting May 2022             ([place]) day 8 bortezomib omitted beginning with the June cycle per patient preference             (C) treatment changed to monthly daratumumab/Velcade/Decadron beginning 06/27/2021-patient decision (14) weekly Velcade/Cytoxan beginning 11/14/2021 (15) changed to Cytoxan/carfilzomib/Decadron 12/19/2021-treatment placed on hold 05/15/2022, carfilzomib 05/24/2022 and 05/31/2022 16.  Leukocyte a pheresis procedure at [facility] 06/15/2022 17.  Treatment resumed with Cytoxan/carfilzomib/Decadron 06/28/2022, last given 09/06/2022 18.  CAR-T 09/26/2022 with Carvykti 19.  CRS and ICANS following CAR-T therapy, status post a Decadron taper completed 10/08/2022 20.  Presentation to the emergency room 10/30/2022 with failure to thrive and a fall  21.  Pancytopenia secondary to multiple myeloma and CAR-T therapy    Disposition: Andre [name] appears stable.  He is now day 42 following CAR-T therapy.  He has failure to thrive but overall appears to be improving.  He is currently  in a nursing facility with the plan to begin physical therapy in the near future.  We reviewed the CBC from today.  Counts overall stable.  Neutropenic and thrombocytopenic precautions reviewed.  His wife understands that he should contact the office with fever, chills, other signs of infection, bleeding.  He has an appointment with Dr. Philipp Ovens in 1 week.  He will return for lab and follow-up here on 11/22/2022.  Patient seen with Dr. Benay Spice.  [name] ANP/GNP-BC   11/06/2022  1:43 PM This was a shared visit with [name].  Andre [name] continues to recover from CAR-T therapy.  He has stable pancytopenia.  We will follow-up on the myeloma panel and immunoglobulin levels from today.  Will communicate with Dr. Philipp Ovens.  He is scheduled for follow-up in [place] next week.  He will return for an office visit in 2 weeks.  I was present for greater than 50% of today's visit.  I performed medical decision making.  Julieanne Manson, MD

## 2022-11-06 NOTE — Patient Outreach (Signed)
[place] Coordinator follow up. Per [place] Mr. Shareef discharged from [facility] on 11/03/22.   Per chart review, appears Mr. Taguchi transferred to [facility].   Confirmed with Wilhemena Durie social worker that Mr. Nordell transitioned from [place] to [place] on 11/03/22.   Made [place] aware that Mr. Agosto is under the [place] 3-Day SNF Waiver. [facility] is one of the facilities that can accept [place] patients.   Will continue to follow for [facility] care coordination needs and transition plans.    Marthenia Rolling, MSN, RN,BSN [place] Acute Care Coordinator [phone] (Direct dial)

## 2022-11-07 ENCOUNTER — Other Ambulatory Visit: Payer: Self-pay

## 2022-11-07 ENCOUNTER — Telehealth: Payer: Self-pay

## 2022-11-07 LAB — IGG: IgG (Immunoglobin G), Serum: 590 mg/dL — ABNORMAL LOW (ref 603–1613)

## 2022-11-07 NOTE — Telephone Encounter (Signed)
Faxed [phone] over lab result, neutropenic/thrombocytopenic precaution information, and the physician visit form. I spoke with Elmyra Ricks to confirm she received the fax.

## 2022-11-08 ENCOUNTER — Other Ambulatory Visit: Payer: Self-pay | Admitting: Oncology

## 2022-11-10 ENCOUNTER — Emergency Department (HOSPITAL_COMMUNITY)
Admission: EM | Admit: 2022-11-10 | Discharge: 2022-11-10 | Disposition: A | Discharge status: Home / Self Care | Payer: Medicare Other | Attending: Emergency Medicine | Admitting: Emergency Medicine

## 2022-11-10 ENCOUNTER — Other Ambulatory Visit: Payer: Self-pay

## 2022-11-10 ENCOUNTER — Encounter (HOSPITAL_COMMUNITY): Payer: Self-pay

## 2022-11-10 ENCOUNTER — Emergency Department (HOSPITAL_COMMUNITY): Payer: Medicare Other

## 2022-11-10 DIAGNOSIS — W01198A Fall on same level from slipping, tripping and stumbling with subsequent striking against other object, initial encounter: Secondary | ICD-10-CM | POA: Diagnosis not present

## 2022-11-10 DIAGNOSIS — S0001XA Abrasion of scalp, initial encounter: Secondary | ICD-10-CM | POA: Diagnosis not present

## 2022-11-10 DIAGNOSIS — W19XXXA Unspecified fall, initial encounter: Secondary | ICD-10-CM

## 2022-11-10 DIAGNOSIS — S0990XA Unspecified injury of head, initial encounter: Secondary | ICD-10-CM | POA: Diagnosis present

## 2022-11-10 MED ORDER — ACETAMINOPHEN 500 MG PO TABS
1000.0000 mg | ORAL_TABLET | Freq: Once | ORAL | Status: AC
  Administered 2022-11-10: 1000 mg via ORAL
  Filled 2022-11-10: qty 2

## 2022-11-10 MED ORDER — OXYCODONE HCL 5 MG PO TABS
2.5000 mg | ORAL_TABLET | Freq: Once | ORAL | Status: AC
  Administered 2022-11-10: 2.5 mg via ORAL
  Filled 2022-11-10: qty 1

## 2022-11-10 NOTE — ED Provider Notes (Signed)
[facility] AT [facility] Provider Note   CSN: [id number] Arrival date & time: 11/10/22  2012     History  Chief Complaint  Patient presents with   Andre [name] is a 72 y.o. male.  72 yo M with a chief complaints of fall.  Patient tells me that he slipped and struck the back of his head on his bed.  He denies other injury denies neck pain chest pain abdominal pain leg pain.   Fall       Home Medications Prior to Admission medications   Medication Sig Start Date End Date Taking? Authorizing Provider  calcium-vitamin D (OSCAL WITH D) 500-200 MG-UNIT tablet Take 1 tablet by mouth daily. 08/14/22   [name], Evie Lacks, MD  cetirizine (ZYRTEC) 10 MG tablet Take 10 mg by mouth daily.    [provider]  Cholecalciferol (VITAMIN D3) 2000 units capsule Take 1 capsule (2,000 Units total) by mouth daily. 08/17/17   [name], Evie Lacks, MD  COVID-19 mRNA vaccine [id number] (COMIRNATY) syringe Inject into the muscle. Patient not taking: Reported on 07/12/2022 07/05/22     dexamethasone (DECADRON) 4 MG tablet Take 2.5 tablets (10 mg total) by mouth as directed. Day of treatment and Day after treatment as of 5/22 Patient not taking: Reported on 11/06/2022 09/20/21   Ladell Pier, MD  diphenhydrAMINE (BENADRYL) 25 mg capsule Take 2 capsules (50 mg total) by mouth every 6 (six) hours as needed. Takes 2 benadryl at bedtime for itching Patient not taking: Reported on 11/06/2022 09/20/21   Ladell Pier, MD  docusate sodium (COLACE) 100 MG capsule Take 100 mg by mouth daily as needed for mild constipation or moderate constipation.    [provider]  escitalopram (LEXAPRO) 10 MG tablet Take 1 tablet (10 mg total) by mouth daily. 06/27/22   [name], Evie Lacks, MD  HYDROCORTISONE ACE, RECTAL, 30 MG SUPP 1 pr bid x 7 d prn Patient not taking: Reported on 06/19/2022 11/20/19   [name], Evie Lacks, MD  hydrocortisone valerate cream  (WESTCORT) 0.2 % Apply 1 application topically 2 (two) times daily. Patient not taking: Reported on 11/06/2022 01/25/21   Harle Stanford., PA-C  levothyroxine (SYNTHROID) 150 MCG tablet TAKE 1 TABLET BY MOUTH EVERY MORNING ON AN EMPTY STOMACH 02/09/22   [name], Evie Lacks, MD  LORazepam (ATIVAN) 0.5 MG tablet Take 1 tablet (0.5 mg total) by mouth at bedtime as needed for anxiety. 08/16/22   [name], Evie Lacks, MD  LORazepam (ATIVAN) 1 MG tablet Take 0.5 tablets (0.5 mg total) by mouth at bedtime as needed for anxiety. Patient not taking: Reported on 11/06/2022 10/31/22   Kommor, Debe Coder, MD  ondansetron (ZOFRAN) 8 MG tablet Take 1 tablet (8 mg total) by mouth 1 hour prior to oral cytoxan and every 8 hours as needed for nausea Patient not taking: Reported on 06/19/2022 02/09/22   [name], Evie Lacks, MD  pantoprazole (PROTONIX) 40 MG tablet TAKE 1 TABLET BY MOUTH TWICE DAILY BEFORE MEALS 02/09/22   [name], Evie Lacks, MD  triamcinolone ointment (KENALOG) 0.1 % Apply to affected area topically 2 (two) times daily. 08/16/22   [name], Evie Lacks, MD  valACYclovir (VALTREX) 1000 MG tablet Take 1 tablet (1,000 mg total) by mouth daily. 02/09/22   [name], Evie Lacks, MD  prochlorperazine (COMPAZINE) 10 MG tablet Take 1 tablet (10 mg total) by mouth every 6 (six) hours as needed (Nausea or vomiting). 12/14/20 12/12/21  Magrinat, Sarajane Jews  C, MD      Allergies    Atorvastatin, Rosuvastatin, Crestor [rosuvastatin calcium], Lipitor [atorvastatin calcium], and Septra [sulfamethoxazole-trimethoprim]    Review of Systems   Review of Systems  Physical Exam Updated Vital Signs BP (!) 162/71   Pulse 75   Temp 99.1 F (37.3 C) (Oral)   Resp 16   Ht '5\' 7"'$  (1.702 m)   Wt 79 kg   SpO2 99% Comment: Simultaneous filing. User may not have seen previous data.  BMI 27.28 kg/m  Physical Exam Vitals and nursing note reviewed.  Constitutional:      Appearance: He is well-developed.  HENT:     Head:  Normocephalic.     Comments: Patient has a small linear abrasion to the vertex. Eyes:     Pupils: Pupils are equal, round, and reactive to light.  Neck:     Vascular: No JVD.  Cardiovascular:     Rate and Rhythm: Normal rate and regular rhythm.     Heart sounds: No murmur heard.    No friction rub. No gallop.  Pulmonary:     Effort: No respiratory distress.     Breath sounds: No wheezing.  Abdominal:     General: There is no distension.     Tenderness: There is no abdominal tenderness. There is no guarding or rebound.  Musculoskeletal:        General: Normal range of motion.     Cervical back: Normal range of motion and neck supple.  Skin:    Coloration: Skin is not pale.     Findings: No rash.  Neurological:     Mental Status: He is alert and oriented to person, place, and time.  Psychiatric:        Behavior: Behavior normal.     ED Results / Procedures / Treatments   Labs (all labs ordered are listed, but only abnormal results are displayed) Labs Reviewed - No data to display  EKG None  Radiology CT Head Wo Contrast  Result Date: 11/10/2022 CLINICAL DATA:  Slipped and fell, hitting head on bed. Worsening headache EXAM: CT HEAD WITHOUT CONTRAST TECHNIQUE: Contiguous axial images were obtained from the base of the skull through the vertex without intravenous contrast. RADIATION DOSE REDUCTION: This exam was performed according to the departmental dose-optimization program which includes automated exposure control, adjustment of the mA and/or kV according to patient size and/or use of iterative reconstruction technique. COMPARISON:  10/29/2022 FINDINGS: Brain: No intracranial hemorrhage, mass effect, or evidence of acute infarct. No hydrocephalus. No extra-axial fluid collection. Generalized cerebral atrophy. Ill-defined hypoattenuation within the cerebral white matter is nonspecific but consistent with chronic small vessel ischemic disease. Vascular: No hyperdense vessel.  Intracranial arterial calcification. Skull: No fracture or focal lesion. Sinuses/Orbits: No acute finding. Paranasal sinuses and mastoid air cells are well aerated. Other: None. IMPRESSION: 1. No evidence of acute intracranial abnormality. 2. Chronic small vessel ischemic disease and cerebral atrophy. Electronically Signed   By: Placido Sou M.D.   On: 11/10/2022 20:59    Procedures Procedures    Medications Ordered in ED Medications  acetaminophen (TYLENOL) tablet 1,000 mg (1,000 mg Oral Given 11/10/22 2030)  oxyCODONE (Oxy IR/ROXICODONE) immediate release tablet 2.5 mg (2.5 mg Oral Given 11/10/22 2030)    ED Course/ Medical Decision Making/ A&P                             Medical Decision Making Amount and/or Complexity  of Data Reviewed Radiology: ordered.  Risk OTC drugs. Prescription drug management.   72 yo M with a chief complaints of closed head injury.  The patient lost his balance and struck his head.  Complaining only of head injury.  Will obtain a CT scan.  Reassess.  CT of the head negative for acute intracranial pathology as independently interpreted by me.  Will discharge home.  PCP follow-up.  9:08 PM:  I have discussed the diagnosis/risks/treatment options with the patient and family.  Evaluation and diagnostic testing in the emergency department does not suggest an emergent condition requiring admission or immediate intervention beyond what has been performed at this time.  They will follow up with PCP. We also discussed returning to the ED immediately if new or worsening sx occur. We discussed the sx which are most concerning (e.g., sudden worsening pain, fever, inability to tolerate by mouth) that necessitate immediate return. Medications administered to the patient during their visit and any new prescriptions provided to the patient are listed below.  Medications given during this visit Medications  acetaminophen (TYLENOL) tablet 1,000 mg (1,000 mg Oral Given  11/10/22 2030)  oxyCODONE (Oxy IR/ROXICODONE) immediate release tablet 2.5 mg (2.5 mg Oral Given 11/10/22 2030)     The patient appears reasonably screen and/or stabilized for discharge and I doubt any other medical condition or other [facility] requiring further screening, evaluation, or treatment in the ED at this time prior to discharge.          Final Clinical Impression(s) / ED Diagnoses Final diagnoses:  Fall, initial encounter    Rx / DC Orders ED Discharge Orders     None         Deno Etienne, DO 11/10/22 2108

## 2022-11-10 NOTE — Discharge Instructions (Signed)
Please return for confusion intractable vomiting.  Please follow-up with your family doctor in the office.

## 2022-11-10 NOTE — ED Triage Notes (Signed)
Patient BIB EMS. Brought in from [place]. Patient slipped and fell hitting his head on his bed. Patient denies LOC and does not take blood thinners. Patient c/o worsening headache. Patient is neuro intact on arrival.

## 2022-11-13 DIAGNOSIS — E876 Hypokalemia: Secondary | ICD-10-CM | POA: Insufficient documentation

## 2022-11-15 ENCOUNTER — Telehealth: Payer: Self-pay

## 2022-11-15 NOTE — Telephone Encounter (Signed)
        Patient  visited [facility] on 11/10/2022  for fall.   Telephone encounter attempt :  1st  A HIPAA compliant voice message was left requesting a return call.  Instructed patient to call back at [phone].   [place] Resource Care Guide   [email or web address]  ?? [email or web address]   Website: [email or web address]  [email or web address]

## 2022-11-17 ENCOUNTER — Telehealth: Payer: Self-pay

## 2022-11-17 NOTE — Telephone Encounter (Signed)
     Patient  visit on 11/10/2022  at [facility] was for fall.  Have you been able to follow up with your primary care physician? Yes  The patient was or was not able to obtain any needed medicine or equipment. No medication prescribed.  Are there diet recommendations that you are having difficulty following? No  Patient expresses understanding of discharge instructions and education provided has no other needs at this time. Yes  Verified home address mailed [employer] transportation application and brochure.   [place] Resource Care Guide   [email or web address]  ?? [email or web address]   Website: [email or web address]  [email or web address]

## 2022-11-22 ENCOUNTER — Encounter: Payer: Self-pay | Admitting: Nurse Practitioner

## 2022-11-22 ENCOUNTER — Inpatient Hospital Stay: Payer: Medicare Other | Attending: Oncology

## 2022-11-22 ENCOUNTER — Inpatient Hospital Stay (HOSPITAL_BASED_OUTPATIENT_CLINIC_OR_DEPARTMENT_OTHER): Payer: Medicare Other | Admitting: Nurse Practitioner

## 2022-11-22 ENCOUNTER — Inpatient Hospital Stay: Payer: Medicare Other

## 2022-11-22 VITALS — BP 120/67 | HR 100 | Temp 98.2°F | Resp 18 | Ht 67.0 in | Wt 175.0 lb

## 2022-11-22 VITALS — BP 145/60 | HR 75 | Temp 98.7°F | Resp 16

## 2022-11-22 DIAGNOSIS — C9000 Multiple myeloma not having achieved remission: Secondary | ICD-10-CM

## 2022-11-22 DIAGNOSIS — R197 Diarrhea, unspecified: Secondary | ICD-10-CM | POA: Insufficient documentation

## 2022-11-22 DIAGNOSIS — D61818 Other pancytopenia: Secondary | ICD-10-CM | POA: Diagnosis not present

## 2022-11-22 LAB — CMP (CANCER CENTER ONLY)
ALT: 12 U/L (ref 0–44)
AST: 15 U/L (ref 15–41)
Albumin: 4.2 g/dL (ref 3.5–5.0)
Alkaline Phosphatase: 51 U/L (ref 38–126)
Anion gap: 9 (ref 5–15)
BUN: 16 mg/dL (ref 8–23)
CO2: 26 mmol/L (ref 22–32)
Calcium: 9.7 mg/dL (ref 8.9–10.3)
Chloride: 108 mmol/L (ref 98–111)
Creatinine: 0.87 mg/dL (ref 0.61–1.24)
GFR, Estimated: >60 mL/min (ref >60)
Glucose, Bld: 91 mg/dL (ref 70–99)
Potassium: 3.4 mmol/L — ABNORMAL LOW (ref 3.5–5.1)
Sodium: 143 mmol/L (ref 135–145)
Total Bilirubin: 0.5 mg/dL (ref 0.3–1.2)
Total Protein: 6.4 g/dL — ABNORMAL LOW (ref 6.5–8.1)

## 2022-11-22 LAB — CBC WITH DIFFERENTIAL (CANCER CENTER ONLY)
Abs Immature Granulocytes: 0.12 10*3/uL — ABNORMAL HIGH (ref 0.00–0.07)
Basophils Absolute: 0 10*3/uL (ref 0.0–0.1)
Basophils Relative: 1 %
Eosinophils Absolute: 0.2 10*3/uL (ref 0.0–0.5)
Eosinophils Relative: 4 %
HCT: 26.4 % — ABNORMAL LOW (ref 39.0–52.0)
Hemoglobin: 8.7 g/dL — ABNORMAL LOW (ref 13.0–17.0)
Immature Granulocytes: 3 %
Lymphocytes Relative: 10 %
Lymphs Abs: 0.3 10*3/uL — ABNORMAL LOW (ref 0.7–4.0)
MCH: 36.1 pg — ABNORMAL HIGH (ref 26.0–34.0)
MCHC: 33 g/dL (ref 30.0–36.0)
MCV: 109.5 fL — ABNORMAL HIGH (ref 80.0–100.0)
Monocytes Absolute: 0.6 10*3/uL (ref 0.1–1.0)
Monocytes Relative: 17 %
Neutro Abs: 2.3 10*3/uL (ref 1.7–7.7)
Neutrophils Relative %: 65 %
Platelet Count: 54 10*3/uL — ABNORMAL LOW (ref 150–400)
RBC: 2.41 MIL/uL — ABNORMAL LOW (ref 4.22–5.81)
RDW: 22.4 % — ABNORMAL HIGH (ref 11.5–15.5)
WBC Count: 3.5 10*3/uL — ABNORMAL LOW (ref 4.0–10.5)
nRBC: 0 % (ref 0.0–0.2)

## 2022-11-22 LAB — SAMPLE TO BLOOD BANK

## 2022-11-22 MED ORDER — ZOLEDRONIC ACID 4 MG/100ML IV SOLN: 4.0000 mg | Freq: Once | INTRAVENOUS | Status: DC

## 2022-11-22 MED ORDER — IMMUNE GLOBULIN (HUMAN) 20 GM/200ML IV SOLN
400.0000 mg/kg | Freq: Once | INTRAVENOUS | Status: AC
  Administered 2022-11-22: 20 g via INTRAVENOUS
  Filled 2022-11-22: qty 300

## 2022-11-22 MED ORDER — DIPHENHYDRAMINE HCL 25 MG PO CAPS
25.0000 mg | ORAL_CAPSULE | Freq: Once | ORAL | Status: AC
  Administered 2022-11-22: 25 mg via ORAL
  Filled 2022-11-22: qty 1

## 2022-11-22 MED ORDER — ACETAMINOPHEN 325 MG PO TABS
650.0000 mg | ORAL_TABLET | Freq: Once | ORAL | Status: AC
  Administered 2022-11-22: 650 mg via ORAL
  Filled 2022-11-22: qty 2

## 2022-11-22 MED ORDER — DEXTROSE 5 % IV SOLN: Freq: Once | INTRAVENOUS | Status: AC

## 2022-11-22 NOTE — Progress Notes (Signed)
[place] OFFICE PROGRESS NOTE   Diagnosis:  Multiple myeloma  INTERVAL HISTORY:   Andre [name] returns as scheduled. He was seen in the emergency department 11/10/2022 following a fall. CT head negative for acute abnormality. He was discharged home.  Bone marrow biopsy 11/13/2022-mildly hypocellular bone marrow with relative erythroid hyperplasia and decreased megakaryocytes; no plasma cells identified by differential count or CD138 immunohistochemical stain; flow cytometry negative for a clonal plasma cell population.   Feels a little "fuzzy" this morning.  He is working with physical therapy.  Reports he is ambulatory with a walker.  He has a good appetite.  Bowels moving, no diarrhea.  He denies nausea/vomiting.  He denies pain.  No fever.  Occasional cough.  No shortness of breath.  Objective:  Vital signs in last 24 hours:  Blood pressure 120/67, pulse 100, temperature 98.2 F (36.8 C), temperature source Oral, resp. rate 18, height '5\' 7"'$  (1.702 m), weight 175 lb (79.4 kg), SpO2 100 %.    HEENT: No thrush or ulcers. Resp: Lungs clear bilaterally. Cardio: Regular rate and rhythm. GI: Abdomen soft and nontender.  No hepatosplenomegaly. Vascular: Trace lower leg edema bilaterally.  Calves soft and nontender. Skin: No rash.   Lab Results:  Lab Results  Component Value Date   WBC 3.5 (L) 11/22/2022   HGB 8.7 (L) 11/22/2022   HCT 26.4 (L) 11/22/2022   MCV 109.5 (H) 11/22/2022   PLT 54 (L) 11/22/2022   NEUTROABS 2.3 11/22/2022    Imaging:  No results found.  Medications: I have reviewed the patient's current medications.  Assessment/Plan: Multiple myeloma- IgG lambda  bortezomib (subcutaneously), lenalidomide, and dexamethasone, with repeat bone marrow biopsy May of 2012 showing 10% plasmacytosis   (2) high-dose chemotherapy with BCNU and melphalan at [facility], followed by stem cell rescue July of 2012   (3) on zoledronic acid started December of 2012,  initially monthly, currently given every 3 months, most recent dose  12/07/2015   (4) low-dose lenalidomide resumed April 2013, interrupted several times.  Resumed again on 02/19/2013, ata dose of 5 mg daily, 21 days on and 7 days off, later further reduced to 7 days on, 7 days off   (5) CNS symptoms and abnormal brain MRI extensively evaluated by neurology with no definitive diagnosis established   (6) rising M spike noted June 2014 but did not meet criteria for carfilzomib study   (7) on lenalidomide 25 mg daily, 14 days on, 7 days off, starting 04/18/2013, interrupted December 2014 because of rash;             (a) resumed January 2015 at 10 mg/ day at 21 days on/ 7 days off             (Johnatan) starting 08/18/2014 decreased to 10 mg/ day 14 days on, 7 days off because of cytopenias             (c) as of February 2016 was on 5 mg daily 7 days on 7 days off             (d) lenalidomide discontinued December 2016 with evidence of disease progression   (8) transient global amnesia 05/29/2015, resolved without intervention   (9) starting PVD 10/25/2015 w ASA 325 thromboprophylaxis, valacyclovir prophylaxis, last dose 12/17/2015             (a) pomalidomide 4 mg/d days 1-14             (Micco) bortezomib sQ days 2,5,9,12  of each 21 day cycle             (c) dexamethasome 20 mg two days a week             (d) dexamethasone bortezomib and pomalidomide discontinued late December 2018 with poor tolerance   (10) metapneumovirus pneumonia April 2017             (a) completing course of steroids and week of bactrim mid April 2017   (11) status post second autologous transplant at [facility] 02/04/2016(preparatory regimen melphalan 200 mg/m)             (a) received twelve-month vaccinations 03/14/2017 (DPT, Haemophilus, Pneumovax 13, polio)             (Maximilian) 14 months injections 05/04/2016 include DTaP, Hib conjugate, HepB energex [place] 20 mcg/ml, Prevnar 13             (c) 24 month vaccines due at [facility] June 2019    (12) maintenance therapy started November 2017, consisting of             (a) bortezomib 1.3 mg/M2 every 14 days, first dose 07/27/2016             (Deangleo) pomalidomide 1 mg days 1-21 Q28 days, started 07/19/2016             (c) zolendronate monthly started 07/27/2016 (previously Q 3 months) however patient unable to tolerate, and changed back to q3 months in April, 2018             (d) Bortezomib changed to monthly as of June 2018 because of tolerance issues, however discontinued after 09/14/2017 dose because of a rise in his M spike             (e) pomalidomide held after 10/18/2017 in preparation for possible study at [place] (venetoclax)--never resumed             (f) with numbers actually improved off treatment, resumed every 2-week bortezomib 12/25/2017             (g) changed to every 3-week bortezomib as of 03/17/2019             (h) briefly on weekly treatments times 22 October 2020 due to increase in M spike             (I) maintenance therapy discontinued with evidence of progression   (13) bortezomib/daratumumab/dexamethasone started 12/20/2020.             (a) Decadron dose dropped to 10 mg day of and day following treatment starting May 2022             (Tod) day 8 bortezomib omitted beginning with the June cycle per patient preference             (C) treatment changed to monthly daratumumab/Velcade/Decadron beginning 06/27/2021-patient decision (14) weekly Velcade/Cytoxan beginning 11/14/2021 (15) changed to Cytoxan/carfilzomib/Decadron 12/19/2021-treatment placed on hold 05/15/2022, carfilzomib 05/24/2022 and 05/31/2022 16.  Leukocyte a pheresis procedure at [facility] 06/15/2022 17.  Treatment resumed with Cytoxan/carfilzomib/Decadron 06/28/2022, last given 09/06/2022 18.  CAR-T 09/26/2022 with Carvykti 19.  CRS and ICANS following CAR-T therapy, status post a Decadron taper completed 10/08/2022 20.  Presentation to the emergency room 10/30/2022 with failure to thrive and a fall  21.   Pancytopenia secondary to multiple myeloma and CAR-T therapy 22.  Bone marrow biopsy 11/13/2022-mildly hypocellular bone marrow with relative erythroid hyperplasia and decreased megakaryocytes; no plasma cells identified by differential  count or CD138 immunohistochemical stain; flow cytometry negative for a clonal plasma cell population.   Disposition: Andre [name] appears unchanged.  We reviewed the recent bone marrow biopsy results with him.  No plasma cells were identified.  He is scheduled to begin monthly IVIG today.  We reviewed the rationale for IVIG and the potential for an allergic reaction.  He agrees to proceed.  We reviewed the CBC from today.  Hemoglobin is stable at 8.7.  White count and platelets are higher.  He will return for lab and follow-up in 2 weeks.  Patient seen with Dr. Benay Spice.    [name] ANP/GNP-BC   11/22/2022  8:28 AM This was a shared visit with [name].  We discussed the bone marrow findings with Andre [name].  He is in clinical remission from multiple myeloma.  He has persistent pancytopenia following the CAR-T therapy.  He has hypogammaglobulinemia.  He will begin monthly IVIG therapy today.  I was present for greater than 50% of today's visit.  I performed medical decision making.  Julieanne Manson, MD

## 2022-11-22 NOTE — Patient Instructions (Signed)
Immune Globulin Injection What is this medication? IMMUNE GLOBULIN (im MUNE  GLOB yoo lin) helps to prevent or reduce the severity of certain infections in patients who are at risk. This medicine is collected from the pooled blood of many donors. It is used to treat immune system problems, thrombocytopenia, and Kawasaki syndrome. This medicine may be used for other purposes; ask your health care provider or pharmacist if you have questions. This medicine may be used for other purposes; ask your health care provider or pharmacist if you have questions. COMMON BRAND NAME(S): ASCENIV, Baygam, BIVIGAM, Carimune, Carimune NF, cutaquig, Cuvitru, Flebogamma, Flebogamma DIF, GamaSTAN, GamaSTAN S/D, Gamimune N, Gammagard, Gammagard S/D, Gammaked, Gammaplex, Gammar-P IV, Gamunex, Gamunex-C, Hizentra, Iveegam, Iveegam EN, Octagam, Panglobulin, Panglobulin NF, panzyga, Polygam S/D, Privigen, Sandoglobulin, Venoglobulin-S, Vigam, Vivaglobulin, Xembify What should I tell my care team before I take this medication? They need to know if you have any of these conditions: diabetes extremely low or no immune antibodies in the blood heart disease history of blood clots hyperprolinemia infection in the blood, sepsis kidney disease recently received or scheduled to receive a vaccination an unusual or allergic reaction to human immune globulin, albumin, maltose, sucrose, other medicines, foods, dyes, or preservatives pregnant or trying to get pregnant breast-feeding How should I use this medication? This medicine is for injection into a muscle or infusion into a vein or skin. It is usually given by a health care professional in a hospital or clinic setting. In rare cases, some brands of this medicine might be given at home. You will be taught how to give this medicine. Use exactly as directed. Take your medicine at regular intervals. Do not take your medicine more often than directed. Talk to your pediatrician  regarding the use of this medicine in children. While this drug may be prescribed for selected conditions, precautions do apply. Overdosage: If you think you have taken too much of this medicine contact a poison control center or emergency room at once. NOTE: This medicine is only for you. Do not share this medicine with others. Overdosage: If you think you have taken too much of this medicine contact a poison control center or emergency room at once. NOTE: This medicine is only for you. Do not share this medicine with others. What if I miss a dose? It is important not to miss your dose. Call your doctor or health care professional if you are unable to keep an appointment. If you give yourself the medicine and you miss a dose, take it as soon as you can. If it is almost time for your next dose, take only that dose. Do not take double or extra doses. What may interact with this medication? aspirin and aspirin-like medicines cisplatin cyclosporine medicines for infection like acyclovir, adefovir, amphotericin [place], bacitracin, cidofovir, foscarnet, ganciclovir, gentamicin, pentamidine, vancomycin NSAIDS, medicines for pain and inflammation, like ibuprofen or naproxen pamidronate vaccines zoledronic acid This list may not describe all possible interactions. Give your health care provider a list of all the medicines, herbs, non-prescription drugs, or dietary supplements you use. Also tell them if you smoke, drink alcohol, or use illegal drugs. Some items may interact with your medicine. This list may not describe all possible interactions. Give your health care provider a list of all the medicines, herbs, non-prescription drugs, or dietary supplements you use. Also tell them if you smoke, drink alcohol, or use illegal drugs. Some items may interact with your medicine. What should I watch for while using this medication?   Your condition will be monitored carefully while you are receiving this  medicine. This medicine is made from pooled blood donations of many different people. It may be possible to pass an infection in this medicine. However, the donors are screened for infections and all products are tested for HIV and hepatitis. The medicine is treated to kill most or all bacteria and viruses. Talk to your doctor about the risks and benefits of this medicine. Do not have vaccinations for at least 14 days before, or until at least 3 months after receiving this medicine. What side effects may I notice from receiving this medication? Side effects that you should report to your doctor or health care professional as soon as possible: allergic reactions like skin rash, itching or hives, swelling of the face, lips, or tongue blue colored lips or skin breathing problems chest pain or tightness fever signs and symptoms of aseptic meningitis such as stiff neck; sensitivity to light; headache; drowsiness; fever; nausea; vomiting; rash signs and symptoms of a blood clot such as chest pain; shortness of breath; pain, swelling, or warmth in the leg signs and symptoms of hemolytic anemia such as fast heartbeat; tiredness; dark yellow or brown urine; or yellowing of the eyes or skin signs and symptoms of kidney injury like trouble passing urine or change in the amount of urine sudden weight gain swelling of the ankles, feet, hands Side effects that usually do not require medical attention (report to your doctor or health care professional if they continue or are bothersome): diarrhea flushing headache increased sweating joint pain muscle cramps muscle pain nausea pain, redness, or irritation at site where injected tiredness This list may not describe all possible side effects. Call your doctor for medical advice about side effects. You may report side effects to FDA at 1-800-FDA-1088. This list may not describe all possible side effects. Call your doctor for medical advice about side  effects. You may report side effects to FDA at 1-800-FDA-1088. Where should I keep my medication? Keep out of the reach of children. This drug is usually given in a hospital or clinic and will not be stored at home. In rare cases, some brands of this medicine may be given at home. If you are using this medicine at home, you will be instructed on how to store this medicine. Throw away any unused medicine after the expiration date on the label. NOTE: This sheet is a summary. It may not cover all possible information. If you have questions about this medicine, talk to your doctor, pharmacist, or health care provider.  2023 Elsevier/Gold Standard (2008-11-26 00:00:00)  

## 2022-11-22 NOTE — Progress Notes (Signed)
Pt complaining of chills and headache. Infusion paused vital signs obtained and Dr Benay Spice update.  Per Dr Benay Spice, continue with infusion. Continue to monitor. Infusion restarted.

## 2022-11-22 NOTE — Progress Notes (Signed)
Per verbal order from Leander Rams, NP Zometa will be held today and given in 2 weeks.

## 2022-11-22 NOTE — Progress Notes (Signed)
Patient seen by Lisa Thomas NP today  Vitals are within treatment parameters.  Labs reviewed by Lisa Thomas NP and are within treatment parameters.  Per physician team, patient is ready for treatment and there are NO modifications to the treatment plan.  

## 2022-11-23 ENCOUNTER — Other Ambulatory Visit: Payer: Self-pay | Admitting: *Deleted

## 2022-11-23 NOTE — Patient Outreach (Signed)
[facility] Post- Acute Care Coordinator follow up. [name] resides in [facility].   Confirmed with Andre [name], [employer] cabin [employer]. [name] transitioned to private pay on 11/21/22. Will remain private pay until Andre [name] (spouse) finds ALF placement.   No identifiable [facility] care coordination needs at this time.   Andre Rolling, MSN, RN,BSN [place] Acute Care Coordinator [phone] (Direct dial)

## 2022-11-27 LAB — MULTIPLE MYELOMA PANEL, SERUM
Albumin SerPl Elph-Mcnc: 3.8 g/dL (ref 2.9–4.4)
Albumin/Glob SerPl: 1.9 — ABNORMAL HIGH (ref 0.7–1.7)
Alpha 1: 0.3 g/dL (ref 0.0–0.4)
Alpha2 Glob SerPl Elph-Mcnc: 0.6 g/dL (ref 0.4–1.0)
B-Globulin SerPl Elph-Mcnc: 0.9 g/dL (ref 0.7–1.3)
Gamma Glob SerPl Elph-Mcnc: 0.3 g/dL — ABNORMAL LOW (ref 0.4–1.8)
Globulin, Total: 2.1 g/dL — ABNORMAL LOW (ref 2.2–3.9)
IgA: <5 mg/dL — ABNORMAL LOW (ref 61–437)
IgG (Immunoglobin G), Serum: 447 mg/dL — ABNORMAL LOW (ref 603–1613)
IgM (Immunoglobulin M), Srm: 5 mg/dL — ABNORMAL LOW (ref 15–143)
M Protein SerPl Elph-Mcnc: 0.2 g/dL — ABNORMAL HIGH
Total Protein ELP: 5.9 g/dL — ABNORMAL LOW (ref 6.0–8.5)

## 2022-12-06 ENCOUNTER — Inpatient Hospital Stay: Payer: Medicare Other

## 2022-12-06 ENCOUNTER — Encounter: Payer: Self-pay | Admitting: Nurse Practitioner

## 2022-12-06 ENCOUNTER — Inpatient Hospital Stay (HOSPITAL_BASED_OUTPATIENT_CLINIC_OR_DEPARTMENT_OTHER): Payer: Medicare Other | Admitting: Nurse Practitioner

## 2022-12-06 VITALS — BP 130/78 | HR 83 | Temp 98.1°F | Resp 18 | Ht 67.0 in | Wt 172.8 lb

## 2022-12-06 DIAGNOSIS — C9000 Multiple myeloma not having achieved remission: Secondary | ICD-10-CM | POA: Diagnosis not present

## 2022-12-06 LAB — CBC WITH DIFFERENTIAL (CANCER CENTER ONLY)
Abs Immature Granulocytes: 0.12 10*3/uL — ABNORMAL HIGH (ref 0.00–0.07)
Basophils Absolute: 0 10*3/uL (ref 0.0–0.1)
Basophils Relative: 1 %
Eosinophils Absolute: 0.1 10*3/uL (ref 0.0–0.5)
Eosinophils Relative: 5 %
HCT: 27.6 % — ABNORMAL LOW (ref 39.0–52.0)
Hemoglobin: 9.2 g/dL — ABNORMAL LOW (ref 13.0–17.0)
Immature Granulocytes: 4 %
Lymphocytes Relative: 12 %
Lymphs Abs: 0.4 10*3/uL — ABNORMAL LOW (ref 0.7–4.0)
MCH: 35.7 pg — ABNORMAL HIGH (ref 26.0–34.0)
MCHC: 33.3 g/dL (ref 30.0–36.0)
MCV: 107 fL — ABNORMAL HIGH (ref 80.0–100.0)
Monocytes Absolute: 0.6 10*3/uL (ref 0.1–1.0)
Monocytes Relative: 21 %
Neutro Abs: 1.8 10*3/uL (ref 1.7–7.7)
Neutrophils Relative %: 57 %
Platelet Count: 56 10*3/uL — ABNORMAL LOW (ref 150–400)
RBC: 2.58 MIL/uL — ABNORMAL LOW (ref 4.22–5.81)
RDW: 19.1 % — ABNORMAL HIGH (ref 11.5–15.5)
WBC Count: 3.1 10*3/uL — ABNORMAL LOW (ref 4.0–10.5)
nRBC: 0 % (ref 0.0–0.2)

## 2022-12-06 LAB — CMP (CANCER CENTER ONLY)
ALT: 9 U/L (ref 0–44)
AST: 16 U/L (ref 15–41)
Albumin: 4.2 g/dL (ref 3.5–5.0)
Alkaline Phosphatase: 51 U/L (ref 38–126)
Anion gap: 9 (ref 5–15)
BUN: 14 mg/dL (ref 8–23)
CO2: 23 mmol/L (ref 22–32)
Calcium: 10.2 mg/dL (ref 8.9–10.3)
Chloride: 106 mmol/L (ref 98–111)
Creatinine: 0.94 mg/dL (ref 0.61–1.24)
GFR, Estimated: >60 mL/min (ref >60)
Glucose, Bld: 91 mg/dL (ref 70–99)
Potassium: 3.6 mmol/L (ref 3.5–5.1)
Sodium: 138 mmol/L (ref 135–145)
Total Bilirubin: 0.5 mg/dL (ref 0.3–1.2)
Total Protein: 6.5 g/dL (ref 6.5–8.1)

## 2022-12-06 NOTE — Progress Notes (Signed)
[place] OFFICE PROGRESS NOTE   Diagnosis: Multiple myeloma  INTERVAL HISTORY:   Mr. Pakkala returns as scheduled.  He is accompanied to today's visit by his wife.  She reports he has moved to a new nursing facility for additional physical therapy.  His wife notes progression of baseline cognitive issues.  She would like for him to see a neurologist.  He completed IVIG 11/22/2022.  He reports tolerating well.  He continues to have intermittent loose stools, incontinence.  He has had several falls.    Objective:  Vital signs in last 24 hours:  Blood pressure 130/78, pulse 83, temperature 98.1 F (36.7 C), temperature source Oral, resp. rate 18, height 5\' 7"  (1.702 m), weight 172 lb 12.8 oz (78.4 kg), SpO2 99 %.    HEENT: No thrush or ulcers. Resp: Lungs clear bilaterally. Cardio: Regular rate and rhythm.  GI: Abdomen soft and nontender.  No hepatosplenomegaly. Vascular: Trace bilateral ankle edema. Neuro: He is alert. Oriented to person, place, time.  Follows commands.   Lab Results:  Lab Results  Component Value Date   WBC 3.1 (L) 12/06/2022   HGB 9.2 (L) 12/06/2022   HCT 27.6 (L) 12/06/2022   MCV 107.0 (H) 12/06/2022   PLT 56 (L) 12/06/2022   NEUTROABS 1.8 12/06/2022    Imaging:  No results found.  Medications: I have reviewed the patient's current medications.  Assessment/Plan: Multiple myeloma- IgG lambda  bortezomib (subcutaneously), lenalidomide, and dexamethasone, with repeat bone marrow biopsy May of 2012 showing 10% plasmacytosis   (2) high-dose chemotherapy with BCNU and melphalan at [facility], followed by stem cell rescue July of 2012   (3) on zoledronic acid started December of 2012, initially monthly, currently given every 3 months, most recent dose  12/07/2015   (4) low-dose lenalidomide resumed April 2013, interrupted several times.  Resumed again on 02/19/2013, ata dose of 5 mg daily, 21 days on and 7 days off, later further reduced to 7  days on, 7 days off   (5) CNS symptoms and abnormal brain MRI extensively evaluated by neurology with no definitive diagnosis established   (6) rising M spike noted June 2014 but did not meet criteria for carfilzomib study   (7) on lenalidomide 25 mg daily, 14 days on, 7 days off, starting 04/18/2013, interrupted December 2014 because of rash;             (a) resumed January 2015 at 10 mg/ day at 21 days on/ 7 days off             (Yaden) starting 08/18/2014 decreased to 10 mg/ day 14 days on, 7 days off because of cytopenias             (c) as of February 2016 was on 5 mg daily 7 days on 7 days off             (d) lenalidomide discontinued December 2016 with evidence of disease progression   (8) transient global amnesia 05/29/2015, resolved without intervention   (9) starting PVD 10/25/2015 w ASA 325 thromboprophylaxis, valacyclovir prophylaxis, last dose 12/17/2015             (a) pomalidomide 4 mg/d days 1-14             (Macari) bortezomib sQ days 2,5,9,12 of each 21 day cycle             (c) dexamethasome 20 mg two days a week             (  d) dexamethasone bortezomib and pomalidomide discontinued late December 2018 with poor tolerance   (10) metapneumovirus pneumonia April 2017             (a) completing course of steroids and week of bactrim mid April 2017   (11) status post second autologous transplant at [facility] 02/04/2016(preparatory regimen melphalan 200 mg/m)             (a) received twelve-month vaccinations 03/14/2017 (DPT, Haemophilus, Pneumovax 13, polio)             (Ercell) 14 months injections 05/04/2016 include DTaP, Hib conjugate, HepB energex [place] 20 mcg/ml, Prevnar 13             (c) 24 month vaccines due at [facility] June 2019   (12) maintenance therapy started November 2017, consisting of             (a) bortezomib 1.3 mg/M2 every 14 days, first dose 07/27/2016             (Jayleon) pomalidomide 1 mg days 1-21 Q28 days, started 07/19/2016             (c) zolendronate monthly started  07/27/2016 (previously Q 3 months) however patient unable to tolerate, and changed back to q3 months in April, 2018             (d) Bortezomib changed to monthly as of June 2018 because of tolerance issues, however discontinued after 09/14/2017 dose because of a rise in his M spike             (e) pomalidomide held after 10/18/2017 in preparation for possible study at [place] (venetoclax)--never resumed             (f) with numbers actually improved off treatment, resumed every 2-week bortezomib 12/25/2017             (g) changed to every 3-week bortezomib as of 03/17/2019             (h) briefly on weekly treatments times 22 October 2020 due to increase in M spike             (I) maintenance therapy discontinued with evidence of progression   (13) bortezomib/daratumumab/dexamethasone started 12/20/2020.             (a) Decadron dose dropped to 10 mg day of and day following treatment starting May 2022             (Therron) day 8 bortezomib omitted beginning with the June cycle per patient preference             (C) treatment changed to monthly daratumumab/Velcade/Decadron beginning 06/27/2021-patient decision (14) weekly Velcade/Cytoxan beginning 11/14/2021 (15) changed to Cytoxan/carfilzomib/Decadron 12/19/2021-treatment placed on hold 05/15/2022, carfilzomib 05/24/2022 and 05/31/2022 16.  Leukocyte a pheresis procedure at [facility] 06/15/2022 17.  Treatment resumed with Cytoxan/carfilzomib/Decadron 06/28/2022, last given 09/06/2022 18.  CAR-T 09/26/2022 with Carvykti 19.  CRS and ICANS following CAR-T therapy, status post a Decadron taper completed 10/08/2022 20.  Presentation to the emergency room 10/30/2022 with failure to thrive and a fall  21.  Pancytopenia secondary to multiple myeloma and CAR-T therapy 22.  Bone marrow biopsy 11/13/2022-mildly hypocellular bone marrow with relative erythroid hyperplasia and decreased megakaryocytes; no plasma cells identified by differential count or CD138  immunohistochemical stain; flow cytometry negative for a clonal plasma cell population.   Disposition: Mr. Trindle appears unchanged.  He continues to recover from CAR-T therapy.  Review of the CBC shows stable pancytopenia.  He began monthly IVIG 11/22/2022.  He will return for the next IVIG in 2 weeks.  We had him scheduled to receive Zometa today.  Per Dr. Melba Coon office note 11/13/2022, hold Zometa for now.  His wife has noted progression of baseline cognitive issues.  She would like for him to see Dr. Carles Collet, neurology.  We made a referral today.  He will return for follow-up and IVIG in 2 weeks.  We are available to see him sooner if needed.  [name] ANP/GNP-BC   12/06/2022  2:03 PM

## 2022-12-07 LAB — IGG, IGA, IGM
IgA: <5 mg/dL — ABNORMAL LOW (ref 61–437)
IgG (Immunoglobin G), Serum: 643 mg/dL (ref 603–1613)
IgM (Immunoglobulin M), Srm: <5 mg/dL — ABNORMAL LOW (ref 15–143)

## 2022-12-08 ENCOUNTER — Telehealth: Payer: Self-pay | Admitting: Internal Medicine

## 2022-12-08 LAB — KAPPA/LAMBDA LIGHT CHAINS
Kappa free light chain: <0.7 mg/L — ABNORMAL LOW (ref 3.3–19.4)
Kappa free light chain: <0.7
Kappa, lambda light chain ratio: UNDETERMINED
Lambda free light chains: <1.5 mg/L — ABNORMAL LOW (ref 5.7–26.3)
Lambda free light chains: <1.5

## 2022-12-08 NOTE — Telephone Encounter (Signed)
Called Andre [name] back inform pt BP has been little elevated. Wife came home and she got on the phone. She states husband BP has been up and down since January. Pt started new chemo treatment and Dr. Benay Spice knows abt BP. He actually saw oncologist on yesterday. Dr. Benay Spice is ok w/ BP she did state they recheck BP it was 158/65. Inform wife will let MD know.Marland KitchenJohny Chess

## 2022-12-08 NOTE — Telephone Encounter (Signed)
Heather with the call center called to inform about a call she was trying to transfer but it go disconnected. Jolyne Loa with [facility] home health called about patient his blood pressure was in the 170's in one arm and in the 180's in the other. States patient had dizziness upon standing. States best callback number for Lovena Le is [phone].

## 2022-12-09 NOTE — Telephone Encounter (Signed)
Noted.  Continue to monitor.  Office visit if problems.  Thank you

## 2022-12-11 ENCOUNTER — Telehealth: Payer: Self-pay | Admitting: *Deleted

## 2022-12-11 DIAGNOSIS — C9000 Multiple myeloma not having achieved remission: Secondary | ICD-10-CM

## 2022-12-11 NOTE — Telephone Encounter (Signed)
Call from nurse with Dr. Melba Coon. Was seen on 3/21 and MD had few issues to relay to Dr. Benay Spice and staff: Needs monthly pentamidine-last dose was 11/13/22 at 4 mg/kg Continue monthly IVIG (next on 4/3) Needs lab every other week See Dr. Benay Spice every other month Reports low threshold to repeat brain MRI and he agrees with neurology consult. Recommends being checked for C. Diff Office note being faxed.

## 2022-12-12 ENCOUNTER — Telehealth: Payer: Self-pay | Admitting: Internal Medicine

## 2022-12-12 ENCOUNTER — Encounter: Payer: Self-pay | Admitting: Oncology

## 2022-12-12 ENCOUNTER — Other Ambulatory Visit (HOSPITAL_BASED_OUTPATIENT_CLINIC_OR_DEPARTMENT_OTHER): Payer: Self-pay

## 2022-12-12 NOTE — Telephone Encounter (Signed)
[place]:  [place]    [phone]   Needs Verbal orders for what service & frequency:  Requesting speech therapy - 1 x week x 4 weeks for memory strategy

## 2022-12-13 NOTE — Telephone Encounter (Signed)
Okay.  Thanks.

## 2022-12-13 NOTE — Telephone Encounter (Signed)
Called Andre [name] no answer LMOM w/MD response.Marland KitchenJohny Chess

## 2022-12-19 ENCOUNTER — Other Ambulatory Visit: Payer: Self-pay

## 2022-12-20 ENCOUNTER — Inpatient Hospital Stay: Payer: Medicare Other | Attending: Oncology | Admitting: Oncology

## 2022-12-20 ENCOUNTER — Inpatient Hospital Stay: Payer: Medicare Other

## 2022-12-20 ENCOUNTER — Other Ambulatory Visit: Payer: Self-pay | Admitting: *Deleted

## 2022-12-20 ENCOUNTER — Encounter: Payer: Self-pay | Admitting: *Deleted

## 2022-12-20 VITALS — BP 125/69 | HR 82 | Temp 98.1°F | Resp 18 | Ht 67.0 in | Wt 169.0 lb

## 2022-12-20 VITALS — BP 123/62 | HR 75 | Temp 98.2°F | Resp 18

## 2022-12-20 DIAGNOSIS — C9000 Multiple myeloma not having achieved remission: Secondary | ICD-10-CM

## 2022-12-20 DIAGNOSIS — D61818 Other pancytopenia: Secondary | ICD-10-CM | POA: Insufficient documentation

## 2022-12-20 LAB — CMP (CANCER CENTER ONLY)
ALT: 9 U/L (ref 0–44)
AST: 16 U/L (ref 15–41)
Albumin: 4.5 g/dL (ref 3.5–5.0)
Alkaline Phosphatase: 50 U/L (ref 38–126)
Anion gap: 10 (ref 5–15)
BUN: 26 mg/dL — ABNORMAL HIGH (ref 8–23)
CO2: 24 mmol/L (ref 22–32)
Calcium: 10.5 mg/dL — ABNORMAL HIGH (ref 8.9–10.3)
Chloride: 105 mmol/L (ref 98–111)
Creatinine: 0.84 mg/dL (ref 0.61–1.24)
GFR, Estimated: >60 mL/min (ref >60)
Glucose, Bld: 98 mg/dL (ref 70–99)
Potassium: 3.5 mmol/L (ref 3.5–5.1)
Sodium: 139 mmol/L (ref 135–145)
Total Bilirubin: 0.5 mg/dL (ref 0.3–1.2)
Total Protein: 6.6 g/dL (ref 6.5–8.1)

## 2022-12-20 LAB — CBC WITH DIFFERENTIAL (CANCER CENTER ONLY)
Abs Immature Granulocytes: 0.17 10*3/uL — ABNORMAL HIGH (ref 0.00–0.07)
Basophils Absolute: 0 10*3/uL (ref 0.0–0.1)
Basophils Relative: 1 %
Eosinophils Absolute: 0.1 10*3/uL (ref 0.0–0.5)
Eosinophils Relative: 3 %
HCT: 28.9 % — ABNORMAL LOW (ref 39.0–52.0)
Hemoglobin: 9.6 g/dL — ABNORMAL LOW (ref 13.0–17.0)
Immature Granulocytes: 5 %
Lymphocytes Relative: 11 %
Lymphs Abs: 0.4 10*3/uL — ABNORMAL LOW (ref 0.7–4.0)
MCH: 35.4 pg — ABNORMAL HIGH (ref 26.0–34.0)
MCHC: 33.2 g/dL (ref 30.0–36.0)
MCV: 106.6 fL — ABNORMAL HIGH (ref 80.0–100.0)
Monocytes Absolute: 0.8 10*3/uL (ref 0.1–1.0)
Monocytes Relative: 21 %
Neutro Abs: 2.3 10*3/uL (ref 1.7–7.7)
Neutrophils Relative %: 59 %
Platelet Count: 43 10*3/uL — ABNORMAL LOW (ref 150–400)
RBC: 2.71 MIL/uL — ABNORMAL LOW (ref 4.22–5.81)
RDW: 16.9 % — ABNORMAL HIGH (ref 11.5–15.5)
WBC Count: 3.8 10*3/uL — ABNORMAL LOW (ref 4.0–10.5)
nRBC: 0 % (ref 0.0–0.2)

## 2022-12-20 MED ORDER — DIPHENHYDRAMINE HCL 25 MG PO CAPS
25.0000 mg | ORAL_CAPSULE | Freq: Once | ORAL | Status: AC
  Administered 2022-12-20: 25 mg via ORAL
  Filled 2022-12-20: qty 1

## 2022-12-20 MED ORDER — GAMMAGARD 10 GM/100ML IJ SOLN: 400.0000 mg/kg | Freq: Once | INTRAMUSCULAR | Status: DC

## 2022-12-20 MED ORDER — ACETAMINOPHEN 325 MG PO TABS
650.0000 mg | ORAL_TABLET | Freq: Once | ORAL | Status: AC
  Administered 2022-12-20: 650 mg via ORAL
  Filled 2022-12-20: qty 2

## 2022-12-20 MED ORDER — IMMUNE GLOBULIN (HUMAN) 20 GM/200ML IV SOLN
400.0000 mg/kg | Freq: Once | INTRAVENOUS | Status: AC
  Administered 2022-12-20: 30 g via INTRAVENOUS
  Filled 2022-12-20: qty 300

## 2022-12-20 MED ORDER — DEXTROSE 5 % IV SOLN: Freq: Once | INTRAVENOUS | Status: AC

## 2022-12-20 NOTE — Patient Instructions (Signed)

## 2022-12-20 NOTE — Progress Notes (Signed)
[facility] OFFICE PROGRESS NOTE   Diagnosis: Multiple myeloma  INTERVAL HISTORY:   Andre [name] returns for a scheduled visit.  He completed treatment with IVIG on 11/22/2022.  No symptom of an allergic reaction.  He is here today with his wife.  He now lives in an assisted living facility.  Andre [name] reports he continues to have memory difficulty.  Objective:  Vital signs in last 24 hours:  Blood pressure 125/69, pulse 82, temperature 98.1 F (36.7 C), temperature source Oral, resp. rate 18, height 5\' 7"  (1.702 m), weight 169 lb (76.7 kg), SpO2 99 %.    HEENT: White coat over the tongue Resp: Decreased breath sounds at the right compared to the left posterior chest, no respiratory distress Cardio: Regular rate and rhythm GI: No hepatosplenomegaly Vascular: No leg edema Neuro: Alert, follows commands, oriented to year, place, and diagnosis    Portacath/PICC-without erythema  Lab Results:  Lab Results  Component Value Date   WBC 3.8 (L) 12/20/2022   HGB 9.6 (L) 12/20/2022   HCT 28.9 (L) 12/20/2022   MCV 106.6 (H) 12/20/2022   PLT 43 (L) 12/20/2022   NEUTROABS 2.3 12/20/2022    CMP  Lab Results  Component Value Date   NA 138 12/06/2022   K 3.6 12/06/2022   CL 106 12/06/2022   CO2 23 12/06/2022   GLUCOSE 91 12/06/2022   BUN 14 12/06/2022   CREATININE 0.94 12/06/2022   CALCIUM 10.2 12/06/2022   PROT 6.5 12/06/2022   ALBUMIN 4.2 12/06/2022   AST 16 12/06/2022   ALT 9 12/06/2022   ALKPHOS 51 12/06/2022   BILITOT 0.5 12/06/2022   GFRNONAA >60 12/06/2022   GFRAA >60 06/15/2020     Medications: I have reviewed the patient's current medications.   Assessment/Plan: Multiple myeloma- IgG lambda  bortezomib (subcutaneously), lenalidomide, and dexamethasone, with repeat bone marrow biopsy May of 2012 showing 10% plasmacytosis   (2) high-dose chemotherapy with BCNU and melphalan at [facility], followed by stem cell rescue July of 2012   (3) on  zoledronic acid started December of 2012, initially monthly, currently given every 3 months, most recent dose  12/07/2015   (4) low-dose lenalidomide resumed April 2013, interrupted several times.  Resumed again on 02/19/2013, ata dose of 5 mg daily, 21 days on and 7 days off, later further reduced to 7 days on, 7 days off   (5) CNS symptoms and abnormal brain MRI extensively evaluated by neurology with no definitive diagnosis established   (6) rising M spike noted June 2014 but did not meet criteria for carfilzomib study   (7) on lenalidomide 25 mg daily, 14 days on, 7 days off, starting 04/18/2013, interrupted December 2014 because of rash;             (a) resumed January 2015 at 10 mg/ day at 21 days on/ 7 days off             (Andre [name]) starting 08/18/2014 decreased to 10 mg/ day 14 days on, 7 days off because of cytopenias             (c) as of February 2016 was on 5 mg daily 7 days on 7 days off             (d) lenalidomide discontinued December 2016 with evidence of disease progression   (8) transient global amnesia 05/29/2015, resolved without intervention   (9) starting PVD 10/25/2015 w ASA 325 thromboprophylaxis, valacyclovir prophylaxis, last dose 12/17/2015             (  a) pomalidomide 4 mg/d days 1-14             (Andre [name]) bortezomib sQ days 2,5,9,12 of each 21 day cycle             (c) dexamethasome 20 mg two days a week             (d) dexamethasone bortezomib and pomalidomide discontinued late December 2018 with poor tolerance   (10) metapneumovirus pneumonia April 2017             (a) completing course of steroids and week of bactrim mid April 2017   (11) status post second autologous transplant at [facility] 02/04/2016(preparatory regimen melphalan 200 mg/m)             (a) received twelve-month vaccinations 03/14/2017 (DPT, Haemophilus, Pneumovax 13, polio)             (Andre [name]) 14 months injections 05/04/2016 include DTaP, Hib conjugate, HepB energex Andre [name] 20 mcg/ml, Prevnar 13             (c) 24  month vaccines due at [facility] June 2019   (12) maintenance therapy started November 2017, consisting of             (a) bortezomib 1.3 mg/M2 every 14 days, first dose 07/27/2016             (Andre [name]) pomalidomide 1 mg days 1-21 Q28 days, started 07/19/2016             (c) zolendronate monthly started 07/27/2016 (previously Q 3 months) however patient unable to tolerate, and changed back to q3 months in April, 2018             (d) Bortezomib changed to monthly as of June 2018 because of tolerance issues, however discontinued after 09/14/2017 dose because of a rise in his M spike             (e) pomalidomide held after 10/18/2017 in preparation for possible study at [facility] (venetoclax)--never resumed             (f) with numbers actually improved off treatment, resumed every 2-week bortezomib 12/25/2017             (g) changed to every 3-week bortezomib as of 03/17/2019             (h) briefly on weekly treatments times 22 October 2020 due to increase in M spike             (I) maintenance therapy discontinued with evidence of progression   (13) bortezomib/daratumumab/dexamethasone started 12/20/2020.             (a) Decadron dose dropped to 10 mg day of and day following treatment starting May 2022             (Andre [name]) day 8 bortezomib omitted beginning with the June cycle per patient preference             (C) treatment changed to monthly daratumumab/Velcade/Decadron beginning 06/27/2021-patient decision (14) weekly Velcade/Cytoxan beginning 11/14/2021 (15) changed to Cytoxan/carfilzomib/Decadron 12/19/2021-treatment placed on hold 05/15/2022, carfilzomib 05/24/2022 and 05/31/2022 16.  Leukocyte a pheresis procedure at [facility] 06/15/2022 17.  Treatment resumed with Cytoxan/carfilzomib/Decadron 06/28/2022, last given 09/06/2022 18.  CAR-T 09/26/2022 with Carvykti 19.  CRS and ICANS following CAR-T therapy, status post a Decadron taper completed 10/08/2022 20.  Presentation to the emergency room 10/30/2022 with  failure to thrive and a fall  21.  Pancytopenia secondary to multiple myeloma and CAR-T  therapy 22.  Bone marrow biopsy 11/13/2022-mildly hypocellular bone marrow with relative erythroid hyperplasia and decreased megakaryocytes; no plasma cells identified by differential count or CD138 immunohistochemical stain; flow cytometry negative for a clonal plasma cell population.  23.  Cognitive impairment predating CAR-T therapy and worsened following CAR-T therapy    Disposition: Mr. Pursley appears stable.  He tolerated IVIG prophylaxis well.  He will receive another dose of IVIG today.  He has persistent thrombocytopenia, likely related to the CAR-T therapy.  Dr. Melba Coon recommends pneumocystis prophylaxis.  Pneumocystis prophylaxis.  Review of the medical record reveals a possible rash to Bactrim in the past.  He was apparently taking doxycycline at the same time and had a rash in sun exposed areas.  I reviewed pneumocystis prophylaxis options [facility].  Pharmacy feels it is safe to administer dapsone given the mild possible rash with Bactrim in the past.  I discussed the risk of allergic reaction with his wife.  The plan is to proceed with dapsone prophylaxis.  Mr. Markham will return for an office visit and IVIG in 1 month.  He will return for a CBC in 2 weeks.  He had pre-existing cognitive impairment prior to the CAR-T treatment.  His mental status has worsened since receiving CAR-T therapy.  Hopefully this will improve over the next few months.  Betsy Coder, MD  12/20/2022  8:52 AM

## 2022-12-20 NOTE — Progress Notes (Signed)
Faxed orders to begin Dapsone 1000 mg daily to facility w/request to call to confirm receipt of order. Also ordered CBC/diff in 2 weeks w/results faxed to [phone]. Dr. Benay Spice feels benefit outweighs risk to take in regards to past mild sulfa allergy. 12/21/22: Call from Andre [name] at [facility]: Andre [name] will start the Dapsone tonight.

## 2022-12-20 NOTE — Progress Notes (Signed)
Patient seen by Dr. Benay Spice today  Vitals are within treatment parameters.  Labs reviewed by Dr. Benay Spice and are within treatment parameters. For IVIG  Per physician team, patient is ready for treatment and there are NO modifications to the treatment plan. Will receive IVIG today

## 2022-12-21 LAB — IGG: IgG (Immunoglobin G), Serum: 533 mg/dL — ABNORMAL LOW (ref 603–1613)

## 2022-12-25 ENCOUNTER — Telehealth: Payer: Self-pay | Admitting: *Deleted

## 2022-12-25 NOTE — Telephone Encounter (Signed)
Mrs. Wiklund left VM requesting script/orders be sent to [facility] for PT/OT. Requested return call. Attempted to reach Mrs. Schey without success. Left VM asking for fax # to send orders and when will he be transferred there (patient said in 7-10 days).

## 2022-12-26 ENCOUNTER — Encounter: Payer: Self-pay | Admitting: Oncology

## 2022-12-26 NOTE — Telephone Encounter (Signed)
Spoke w/wife and she is not aware where to send his PT/OT orders. [facility] is supposed to call her. They will need his medication list as well. Informed Mrs. Kool that she needs to reach out to CSW at current facility. They should be facilitating this transfer.

## 2022-12-27 ENCOUNTER — Encounter: Payer: Self-pay | Admitting: Physician Assistant

## 2022-12-29 ENCOUNTER — Encounter: Payer: Self-pay | Admitting: *Deleted

## 2022-12-29 NOTE — Progress Notes (Signed)
Faxed completed FL2 Form, PT/OT orders and order sheet to [facility] [phone] att: Hiram Gash.

## 2023-01-02 ENCOUNTER — Non-Acute Institutional Stay (SKILLED_NURSING_FACILITY): Payer: Medicare Other | Admitting: Internal Medicine

## 2023-01-02 ENCOUNTER — Encounter: Payer: Self-pay | Admitting: Internal Medicine

## 2023-01-02 DIAGNOSIS — G620 Drug-induced polyneuropathy: Secondary | ICD-10-CM

## 2023-01-02 DIAGNOSIS — T451X5A Adverse effect of antineoplastic and immunosuppressive drugs, initial encounter: Secondary | ICD-10-CM

## 2023-01-02 DIAGNOSIS — W19XXXA Unspecified fall, initial encounter: Secondary | ICD-10-CM

## 2023-01-02 DIAGNOSIS — R4189 Other symptoms and signs involving cognitive functions and awareness: Secondary | ICD-10-CM

## 2023-01-02 DIAGNOSIS — F329 Major depressive disorder, single episode, unspecified: Secondary | ICD-10-CM

## 2023-01-02 DIAGNOSIS — R079 Chest pain, unspecified: Secondary | ICD-10-CM

## 2023-01-02 DIAGNOSIS — C9000 Multiple myeloma not having achieved remission: Secondary | ICD-10-CM

## 2023-01-02 DIAGNOSIS — E039 Hypothyroidism, unspecified: Secondary | ICD-10-CM

## 2023-01-02 NOTE — Progress Notes (Unsigned)
Provider:   Location:  [facility] Room Number: 142A Place of Service:  SNF (31)  PCP: [name], Georgina Quint, MD Patient Care Team: Tresa Garter, MD as PCP - General (Internal Medicine) Ronalee Red, Wynelle Cleveland, MD as Consulting Physician (Neurology) Eddie Candle, MD as Consulting Physician (Internal Medicine) York Spaniel, MD (Inactive) as Consulting Physician (Neurology) Sharlett Iles, DDS as Referring Physician (Oral Surgery) Ladene Artist, MD as Consulting Physician (Oncology)  Extended Emergency Contact Information Primary Emergency Contact: Moro,[name] Address: [address]          [place], [place] [address] [name] of [place] Home Phone: [phone] Relation: Spouse  Code Status: DNR Goals of Care: Advanced Directive information    01/02/2023   11:30 AM  Advanced Directives  Does Patient Have a Medical Advance Directive? No  Type of Advance Directive Out of facility DNR (pink MOST or yellow form)  Does patient want to make changes to medical advance directive? No - Patient declined      Chief Complaint  Patient presents with   New Admit To SNF    Patient is being seen for new admit for snf    Quality Metric Gaps    Discussed the need for AWV and Hep C screening    Immunizations    Discussed the need for shingles and pneumonia vaccine    HPI: Patient is a 72 y.o. male seen today for admission to  Past Medical History:  Diagnosis Date   Allergy    Anemia    Arthritis    Asthma    no treatment x 20 years   Depression    Double vision    occurs at times    Duodenal ulcer    GERD (gastroesophageal reflux disease)    Hyperlipidemia    Hypertension    Hypothyroidism    Multiple myeloma 07/04/2011   Thyroid disease    Past Surgical History:  Procedure Laterality Date   BONE MARROW TRANSPLANT  2011   for MM   CARDIOLITE STUDY  11/25/2003   NORMAL   TONSILLECTOMY      reports  that he quit smoking about 53 years ago. His smoking use included cigarettes. He has never used smokeless tobacco. He reports that he does not drink alcohol and does not use drugs. Social History   Socioeconomic History   Marital status: Married    Spouse name: Not on file   Number of children: Not on file   Years of education: Not on file   Highest education level: Not on file  Occupational History   Not on file  Tobacco Use   Smoking status: Former    Years: 3    Types: Cigarettes    Quit date: 03/27/1969    Years since quitting: 53.8   Smokeless tobacco: Never  Substance and Sexual Activity   Alcohol use: No   Drug use: No   Sexual activity: Not Currently  Other Topics Concern   Not on file  Social History Narrative   Not on file   Social Determinants of Health   Financial Resource Strain: Not on file  Food Insecurity: Not on file  Transportation Needs: No Transportation Needs (11/17/2022)   PRAPARE - Administrator, Civil Service (Medical): No    Lack of Transportation (Non-Medical): No  Physical Activity: Not on file  Stress: Not on file  Social Connections: Not on file  Intimate Partner Violence: Not on file  Functional Status Survey:    Family History  Problem Relation Age of Onset   Throat cancer Mother    Hypertension Father    Stroke Father    Asthma Father    Diabetes Father    Pancreatic cancer Brother     Health Maintenance  Topic Date Due   Medicare Annual Wellness (AWV)  Never done   Hepatitis C Screening  Never done   Zoster Vaccines- Shingrix (1 of 2) Never done   Pneumonia Vaccine 54+ Years old (3 of 3 - PPSV23 or PCV20) 06/16/2018   COVID-19 Vaccine (5 - 2023-24 season) 01/18/2023 (Originally 08/30/2022)   INFLUENZA VACCINE  04/19/2023   COLONOSCOPY (Pts 45-7yrs Insurance coverage will need to be confirmed)  09/24/2023   DTaP/Tdap/Td (4 - Tdap) 03/06/2028   HPV VACCINES  Aged Out    Allergies  Allergen Reactions    Atorvastatin Other (See Comments)   Rosuvastatin     Other reaction(s): Liver Disorder   Crestor [Rosuvastatin Calcium]     ADVERSE EFFECTS ON LIVER   Lipitor [Atorvastatin Calcium]    Septra [Sulfamethoxazole-Trimethoprim] Rash    Outpatient Encounter Medications as of 01/02/2023  Medication Sig   acetaminophen (TYLENOL) 650 MG CR tablet Take 650 mg by mouth every 4 (four) hours as needed for pain.   calcium-vitamin D (OSCAL WITH D) 500-200 MG-UNIT tablet Take 1 tablet by mouth daily.   cetirizine (ZYRTEC) 10 MG tablet Take 10 mg by mouth daily.   Cholecalciferol (VITAMIN D3) 25 MCG (1000 UT) capsule Take 1,000 Units by mouth daily.   dapsone 100 MG tablet Take 100 mg by mouth daily. Faxed order to [facility]   docusate sodium (COLACE) 100 MG capsule Take 100 mg by mouth daily as needed for mild constipation or moderate constipation.   escitalopram (LEXAPRO) 10 MG tablet Take 1 tablet (10 mg total) by mouth daily.   levothyroxine (SYNTHROID) 150 MCG tablet TAKE 1 TABLET BY MOUTH EVERY MORNING ON AN EMPTY STOMACH   methylphenidate (RITALIN) 5 MG tablet Take 2.5 mg by mouth daily.   triamcinolone ointment (KENALOG) 0.1 % Apply to affected area topically 2 (two) times daily.   valACYclovir (VALTREX) 500 MG tablet Take 500 mg by mouth 2 (two) times daily.   COVID-19 mRNA vaccine 2023-2024 (COMIRNATY) syringe Inject into the muscle. (Patient not taking: Reported on 07/12/2022)   dexamethasone (DECADRON) 4 MG tablet Take 2.5 tablets (10 mg total) by mouth as directed. Day of treatment and Day after treatment as of 5/22 (Patient not taking: Reported on 11/06/2022)   fluconazole (DIFLUCAN) 100 MG tablet Take 100 mg by mouth daily. X 5 days (Patient not taking: Reported on 01/02/2023)   [DISCONTINUED] prochlorperazine (COMPAZINE) 10 MG tablet Take 1 tablet (10 mg total) by mouth every 6 (six) hours as needed (Nausea or vomiting).   No facility-administered encounter medications on file as of  01/02/2023.    Review of Systems  Vitals:   01/02/23 1002  BP: 122/72  Pulse: (!) 118  Resp: 20  Temp: (!) 97.4 F (36.3 C)  TempSrc: Temporal  SpO2: 97%  Weight: 167 lb 4.8 oz (75.9 kg)  Height: 5\' 7"  (1.702 m)   Body mass index is 26.2 kg/m. Physical Exam  Labs reviewed: Basic Metabolic Panel: Recent Labs    11/22/22 0757 12/06/22 1305 12/20/22 0817  NA 143 138 139  K 3.4* 3.6 3.5  CL 108 106 105  CO2 26 23 24   GLUCOSE 91 91 98  BUN  16 14 26*  CREATININE 0.87 0.94 0.84  CALCIUM 9.7 10.2 10.5*   Liver Function Tests: Recent Labs    11/22/22 0757 12/06/22 1305 12/20/22 0817  AST ALT ALKPHOS 51 51 50  BILITOT 0.5 0.5 0.5  PROT 6.4* 6.5 6.6  ALBUMIN 4.2 4.2 4.5   Recent Labs    10/29/22 2042  LIPASE 57*   No results for input(s): "AMMONIA" in the last 8760 hours. CBC: Recent Labs    11/22/22 0757 12/06/22 1305 12/20/22 0817  WBC 3.5* 3.1* 3.8*  NEUTROABS 2.3 1.8 2.3  HGB 8.7* 9.2* 9.6*  HCT 26.4* 27.6* 28.9*  MCV 109.5* 107.0* 106.6*  PLT 54* 56* 43*   Cardiac Enzymes: No results for input(s): "CKTOTAL", "CKMB", "CKMBINDEX", "TROPONINI" in the last 8760 hours. BNP: Invalid input(s): "POCBNP" Lab Results  Component Value Date   HGBA1C 5.4 03/27/2012   Lab Results  Component Value Date   TSH 0.02 (L) 08/04/2021   Lab Results  Component Value Date   VITAMINB12 344 08/16/2016   Lab Results  Component Value Date   FOLATE 15.7 09/22/2010   Lab Results  Component Value Date   IRON 111 02/06/2017   TIBC 344 02/06/2017   FERRITIN 130 02/06/2017    Imaging and Procedures obtained prior to SNF admission: CT Head Wo Contrast  Result Date: 11/10/2022 CLINICAL DATA:  Slipped and fell, hitting head on bed. Worsening headache EXAM: CT HEAD WITHOUT CONTRAST TECHNIQUE: Contiguous axial images were obtained from the base of the skull through the vertex without intravenous contrast. RADIATION DOSE REDUCTION: This exam was  performed according to the departmental dose-optimization program which includes automated exposure control, adjustment of the mA and/or kV according to patient size and/or use of iterative reconstruction technique. COMPARISON:  10/29/2022 FINDINGS: Brain: No intracranial hemorrhage, mass effect, or evidence of acute infarct. No hydrocephalus. No extra-axial fluid collection. Generalized cerebral atrophy. Ill-defined hypoattenuation within the cerebral white matter is nonspecific but consistent with chronic small vessel ischemic disease. Vascular: No hyperdense vessel. Intracranial arterial calcification. Skull: No fracture or focal lesion. Sinuses/Orbits: No acute finding. Paranasal sinuses and mastoid air cells are well aerated. Other: None. IMPRESSION: 1. No evidence of acute intracranial abnormality. 2. Chronic small vessel ischemic disease and cerebral atrophy. Electronically Signed   By: Minerva Fester M.D.   On: 11/10/2022 20:59    Assessment/Plan There are no diagnoses linked to this encounter.   Family/ staff Communication:   Labs/tests ordered:

## 2023-01-03 ENCOUNTER — Other Ambulatory Visit: Payer: Self-pay | Admitting: Orthopedic Surgery

## 2023-01-03 ENCOUNTER — Encounter: Payer: Self-pay | Admitting: Internal Medicine

## 2023-01-03 DIAGNOSIS — R079 Chest pain, unspecified: Secondary | ICD-10-CM

## 2023-01-03 MED ORDER — TRAMADOL HCL 50 MG PO TABS
50.0000 mg | ORAL_TABLET | Freq: Four times a day (QID) | ORAL | 0 refills | Status: AC | PRN
Start: 2023-01-03 — End: 2023-02-02

## 2023-01-04 LAB — CBC AND DIFFERENTIAL
HCT: 28 — AB (ref 41–53)
Hemoglobin: 9.1 — AB (ref 13.5–17.5)
Platelets: 28 10*3/uL — AB (ref 150–400)
WBC: 2.1

## 2023-01-04 LAB — CBC: RBC: 2.54 — AB (ref 3.87–5.11)

## 2023-01-05 ENCOUNTER — Telehealth: Payer: Self-pay | Admitting: *Deleted

## 2023-01-05 NOTE — Telephone Encounter (Signed)
Dr. Truett Perna received CBC results from [facility] lab w/platelets 26; WBC 2.1 and Hgb 9.0. Left VM for patient and wife to call to inform office if he needed a transfusion or if he was having active bleeding or bruising.  Called SNF and spoke with nurse, Donnamae Jude and she reports he has no s/s of bleeding and has been able to continue PT/OT without issue. They did do a recent CXR that showed mild atelectasis--was started on incentive spirometer QID. No fever or s/s of infection. Made nurse aware of this f/u on 5/2. Per Dr. Truett Perna: Check CBC/diff next week. Orders faxed to facility

## 2023-01-08 ENCOUNTER — Non-Acute Institutional Stay (SKILLED_NURSING_FACILITY): Payer: Medicare Other | Admitting: Adult Health

## 2023-01-08 ENCOUNTER — Encounter: Payer: Self-pay | Admitting: Adult Health

## 2023-01-08 DIAGNOSIS — R2681 Unsteadiness on feet: Secondary | ICD-10-CM | POA: Diagnosis not present

## 2023-01-08 DIAGNOSIS — D696 Thrombocytopenia, unspecified: Secondary | ICD-10-CM | POA: Diagnosis not present

## 2023-01-08 DIAGNOSIS — S0003XA Contusion of scalp, initial encounter: Secondary | ICD-10-CM

## 2023-01-08 NOTE — Progress Notes (Unsigned)
Location:  [facility] Room Number: 142A Place of Service:  SNF [phone]) Provider: Tally Joe  [name], Georgina Quint, MD  Patient Care Team: Tresa Garter, MD as PCP - General (Internal Medicine) Ronalee Red, Wynelle Cleveland, MD as Consulting Physician (Neurology) Eddie Candle, MD as Consulting Physician (Internal Medicine) York Spaniel, MD (Inactive) as Consulting Physician (Neurology) Sharlett Iles, DDS as Referring Physician (Oral Surgery) Ladene Artist, MD as Consulting Physician (Oncology)  Extended Emergency Contact Information Primary Emergency Contact: Shibuya,[name] Address: [address]          [place], [place] [address] [name] Home Phone: [phone] Relation: Spouse  DNR Goals of care: Advanced Directive information    01/08/2023   10:34 AM  Advanced Directives  Does Patient Have a Medical Advance Directive? No  Type of Advance Directive Out of facility DNR (pink MOST or yellow form);Living will  Does patient want to make changes to medical advance directive? No - Patient declined     Chief Complaint  Patient presents with   Acute Visit    Patient is being seen for a acute fall    Immunizations    Discussed the need for shingles and Pneumonia vaccine    Health Maintenance    Patient is due for Hep C screening and AWV    HPI:  Pt is a 72 y.o. male seen today for an acute visit for falls   Nurse reports that pt fell 4/20 and had a small lump to the back of the scalp. This was reported to me 4/22.  No neuro changes. He again fell in the bathroom this am 4/22.  He did not hit his head on the second fall but it was unwitnessed. He is not passing out or tripping. Just seems to be weak and have gait instability. He reports that he did not injure himself. He denies any headache, visions changes, or dizziness. Nurse reports he gets up without assistance and has weakness and unsteady gait.  He recently moved to [facility] due to falls/cognitive decline, after CAR-T due to Multiple Myeloma. He has had some cognitive decline as well. No change in baseline after fall.   Of note his last labs indicate a plt count of 28 K. This was reported to oncology and a repeat was ordered.   He is not having any excessive bruising, bleeding, epistaxis etc     Past Medical History:  Diagnosis Date   Allergy    Anemia    Arthritis    Asthma    no treatment x 20 years   Depression    Double vision    occurs at times    Duodenal ulcer    GERD (gastroesophageal reflux disease)    Hyperlipidemia    Hypertension    Hypothyroidism    Multiple myeloma 07/04/2011   Thyroid disease    Past Surgical History:  Procedure Laterality Date   BONE MARROW TRANSPLANT  2011   for MM   CARDIOLITE STUDY  11/25/2003   NORMAL   TONSILLECTOMY      Allergies  Allergen Reactions   Atorvastatin Other (See Comments)   Rosuvastatin     Other reaction(s): Liver Disorder   Crestor [Rosuvastatin Calcium]     ADVERSE EFFECTS ON LIVER   Lipitor [Atorvastatin Calcium]    Septra [Sulfamethoxazole-Trimethoprim] Rash    Outpatient Encounter Medications as of 01/08/2023  Medication Sig   acetaminophen (TYLENOL) 650 MG CR tablet Take 650 mg  by mouth every 6 (six) hours as needed for pain.   calcium-vitamin D (OSCAL WITH D) 500-200 MG-UNIT tablet Take 1 tablet by mouth daily.   cetirizine (ZYRTEC) 10 MG tablet Take 10 mg by mouth daily.   Cholecalciferol (VITAMIN D3) 25 MCG (1000 UT) capsule Take 1,000 Units by mouth daily.   dapsone 100 MG tablet Take 100 mg by mouth daily. Faxed order to [facility]   docusate sodium (COLACE) 100 MG capsule Take 100 mg by mouth daily as needed for mild constipation or moderate constipation.   escitalopram (LEXAPRO) 10 MG tablet Take 1 tablet (10 mg total) by mouth daily.   levothyroxine (SYNTHROID) 150 MCG tablet TAKE 1 TABLET BY MOUTH EVERY MORNING ON AN EMPTY  STOMACH   LORazepam (ATIVAN) 0.5 MG tablet Take 0.5 mg by mouth at bedtime as needed for anxiety.   methylphenidate (RITALIN) 5 MG tablet Take 2.5 mg by mouth daily.   traMADol (ULTRAM) 50 MG tablet Take 1 tablet (50 mg total) by mouth every 6 (six) hours as needed.   triamcinolone ointment (KENALOG) 0.1 % Apply to affected area topically 2 (two) times daily.   valACYclovir (VALTREX) 500 MG tablet Take 500 mg by mouth 2 (two) times daily.   dexamethasone (DECADRON) 4 MG tablet Take 2.5 tablets (10 mg total) by mouth as directed. Day of treatment and Day after treatment as of 5/22   [DISCONTINUED] prochlorperazine (COMPAZINE) 10 MG tablet Take 1 tablet (10 mg total) by mouth every 6 (six) hours as needed (Nausea or vomiting).   No facility-administered encounter medications on file as of 01/08/2023.    Review of Systems  Constitutional:  Positive for activity change. Negative for appetite change, chills, diaphoresis, fatigue, fever and unexpected weight change.  Respiratory:  Negative for cough, shortness of breath, wheezing and stridor.   Cardiovascular:  Positive for leg swelling. Negative for chest pain and palpitations.  Gastrointestinal:  Negative for abdominal distention, abdominal pain, constipation and diarrhea.  Genitourinary:  Negative for difficulty urinating and dysuria.  Musculoskeletal:  Positive for gait problem. Negative for arthralgias, back pain, joint swelling and myalgias.  Neurological:  Negative for dizziness, seizures, syncope, facial asymmetry, speech difficulty, weakness and headaches.  Hematological:  Negative for adenopathy. Does not bruise/bleed easily.  Psychiatric/Behavioral:  Positive for confusion. Negative for agitation and behavioral problems.     Immunization History  Administered Date(s) Administered   COVID-19, mRNA, vaccine(Comirnaty)12 years and older 07/05/2022   DTaP / Hep Rylon / IPV 05/04/2017   DTaP / HiB / IPV 03/06/2018   Fluad Quad(high Dose 65+)  06/09/2019, 06/25/2020, 07/25/2021, 05/30/2022   HIB (PRP-T) 05/04/2017   Hepatitis Siddhartha, ADULT 03/14/2017, 03/06/2018   Influenza, High Dose Seasonal PF 06/16/2018   Influenza,inj,Quad PF,6+ Mos 06/16/2013, 07/03/2014, 06/11/2015, 05/31/2017   Influenza-Unspecified 07/29/2012   PFIZER(Purple Top)SARS-COV-2 Vaccination 10/26/2019, 11/20/2019   Pfizer Covid-19 Vaccine Bivalent Booster 29yrs & up 06/27/2021   Pneumococcal Conjugate-13 10/15/2013, 05/04/2017, 03/06/2018   Pneumococcal Polysaccharide-23 06/16/2013   Respiratory Syncytial Virus Vaccine,Recomb Aduvanted(Arexvy) 05/30/2022   Td 07/03/2014   Pertinent  Health Maintenance Due  Topic Date Due   INFLUENZA VACCINE  04/19/2023   COLONOSCOPY (Pts 45-36yrs Insurance coverage will need to be confirmed)  09/24/2023      07/12/2022    8:00 AM 07/26/2022   10:22 AM 08/18/2022    9:00 AM 08/30/2022   11:01 AM 09/06/2022   10:00 AM  Fall Risk  (RETIRED) Patient Fall Risk Level Low fall risk Low fall  risk Low fall risk Low fall risk Low fall risk   Functional Status Survey:    Vitals:   01/08/23 1017  BP: 135/75  Pulse: 71  Resp: 15  Temp: (!) 97 F (36.1 C)  TempSrc: Temporal  SpO2: 93%  Weight: 167 lb 4.8 oz (75.9 kg)  Height: 5\' 7"  (1.702 m)   Body mass index is 26.2 kg/m. Physical Exam Vitals and nursing note reviewed.  Constitutional:      General: He is not in acute distress.    Appearance: He is not diaphoretic.  HENT:     Head: Normocephalic and atraumatic.  Eyes:     Extraocular Movements: Extraocular movements intact.     Conjunctiva/sclera: Conjunctivae normal.     Pupils: Pupils are equal, round, and reactive to light.  Neck:     Thyroid: No thyromegaly.     Vascular: No JVD.     Trachea: No tracheal deviation.  Cardiovascular:     Rate and Rhythm: Normal rate and regular rhythm.     Heart sounds: No murmur heard. Pulmonary:     Effort: Pulmonary effort is normal. No respiratory distress.     Breath  sounds: Normal breath sounds. No wheezing.  Abdominal:     General: Bowel sounds are normal. There is no distension.     Palpations: Abdomen is soft.     Tenderness: There is no abdominal tenderness.  Musculoskeletal:     Cervical back: No rigidity or tenderness.  Lymphadenopathy:     Cervical: No cervical adenopathy.  Skin:    General: Skin is warm and dry.  Neurological:     General: No focal deficit present.     Mental Status: He is alert. Mental status is at baseline.     Cranial Nerves: No cranial nerve deficit.     Labs reviewed: Recent Labs    11/22/22 0757 12/06/22 1305 12/20/22 0817  NA 143 138 139  K 3.4* 3.6 3.5  CL 108 106 105  CO2 26 23 24   GLUCOSE 91 91 98  BUN 16 14 26*  CREATININE 0.87 0.94 0.84  CALCIUM 9.7 10.2 10.5*   Recent Labs    11/22/22 0757 12/06/22 1305 12/20/22 0817  AST 15 16 16   ALT 12 9 9   ALKPHOS 51 51 50  BILITOT 0.5 0.5 0.5  PROT 6.4* 6.5 6.6  ALBUMIN 4.2 4.2 4.5   Recent Labs    11/22/22 0757 12/06/22 1305 12/20/22 0817 01/04/23 0000  WBC 3.5* 3.1* 3.8* 2.1  NEUTROABS 2.3 1.8 2.3  --   HGB 8.7* 9.2* 9.6* 9.1*  HCT 26.4* 27.6* 28.9* 28*  MCV 109.5* 107.0* 106.6*  --   PLT 54* 56* 43* 28*   Lab Results  Component Value Date   TSH 0.02 (L) 08/04/2021   Lab Results  Component Value Date   HGBA1C 5.4 03/27/2012   Lab Results  Component Value Date   CHOL 164 09/06/2017   HDL 38.60 (L) 09/06/2017   LDLCALC 86 09/06/2017   LDLDIRECT 155.8 11/13/2012   TRIG 194.0 (H) 09/06/2017   CHOLHDL 4 09/06/2017    Significant Diagnostic Results in last 30 days:  No results found.  Assessment/Plan  1. Gait instability Due to CAR-T treatment, neuropathy,weakness fall, judgement issues Fall prec Working with PT/OT  Discussed that he would benefit from sitting at the desk, going to activities.   2. Hematoma of scalp, initial encounter Small Continue neuro checks  No focal deficit or cognitive change at this  time.    3. Thrombocytopenia Followed by oncology Recheck ordered per onc   Family/ staff Communication: ***  Labs/tests ordered:  ***

## 2023-01-09 ENCOUNTER — Encounter: Payer: Self-pay | Admitting: Adult Health

## 2023-01-11 ENCOUNTER — Telehealth: Payer: Self-pay | Admitting: *Deleted

## 2023-01-11 ENCOUNTER — Encounter (HOSPITAL_BASED_OUTPATIENT_CLINIC_OR_DEPARTMENT_OTHER): Payer: Self-pay

## 2023-01-11 ENCOUNTER — Other Ambulatory Visit: Payer: Self-pay

## 2023-01-11 ENCOUNTER — Other Ambulatory Visit: Payer: Self-pay | Admitting: Internal Medicine

## 2023-01-11 ENCOUNTER — Other Ambulatory Visit: Payer: Self-pay | Admitting: Adult Health

## 2023-01-11 ENCOUNTER — Emergency Department (HOSPITAL_BASED_OUTPATIENT_CLINIC_OR_DEPARTMENT_OTHER): Payer: Medicare Other | Admitting: Radiology

## 2023-01-11 ENCOUNTER — Emergency Department (HOSPITAL_BASED_OUTPATIENT_CLINIC_OR_DEPARTMENT_OTHER)
Admission: EM | Admit: 2023-01-11 | Discharge: 2023-01-11 | Disposition: A | Discharge status: Home / Self Care | Payer: Medicare Other | Attending: Emergency Medicine | Admitting: Emergency Medicine

## 2023-01-11 ENCOUNTER — Emergency Department (HOSPITAL_BASED_OUTPATIENT_CLINIC_OR_DEPARTMENT_OTHER): Payer: Medicare Other

## 2023-01-11 DIAGNOSIS — Z79899 Other long term (current) drug therapy: Secondary | ICD-10-CM | POA: Diagnosis not present

## 2023-01-11 DIAGNOSIS — W228XXA Striking against or struck by other objects, initial encounter: Secondary | ICD-10-CM | POA: Insufficient documentation

## 2023-01-11 DIAGNOSIS — R42 Dizziness and giddiness: Secondary | ICD-10-CM | POA: Diagnosis not present

## 2023-01-11 DIAGNOSIS — E039 Hypothyroidism, unspecified: Secondary | ICD-10-CM | POA: Insufficient documentation

## 2023-01-11 DIAGNOSIS — I10 Essential (primary) hypertension: Secondary | ICD-10-CM | POA: Diagnosis not present

## 2023-01-11 DIAGNOSIS — W19XXXA Unspecified fall, initial encounter: Secondary | ICD-10-CM

## 2023-01-11 DIAGNOSIS — S0990XA Unspecified injury of head, initial encounter: Secondary | ICD-10-CM

## 2023-01-11 LAB — URINALYSIS, W/ REFLEX TO CULTURE (INFECTION SUSPECTED)
Bacteria, UA: NONE SEEN
Bilirubin Urine: NEGATIVE
Glucose, UA: NEGATIVE mg/dL
Hgb urine dipstick: NEGATIVE
Ketones, ur: NEGATIVE mg/dL
Leukocytes,Ua: NEGATIVE
Nitrite: NEGATIVE
Protein, ur: NEGATIVE mg/dL
Specific Gravity, Urine: 1.022 (ref 1.005–1.030)
pH: 5.5 (ref 5.0–8.0)

## 2023-01-11 LAB — CBC WITH DIFFERENTIAL/PLATELET
Abs Immature Granulocytes: 0.15 10*3/uL — ABNORMAL HIGH (ref 0.00–0.07)
Basophils Absolute: 0 10*3/uL (ref 0.0–0.1)
Basophils Relative: 0 %
Eosinophils Absolute: 0.1 10*3/uL (ref 0.0–0.5)
Eosinophils Relative: 2 %
HCT: 29.2 % — ABNORMAL LOW (ref 39.0–52.0)
Hemoglobin: 9.2 g/dL — ABNORMAL LOW (ref 13.0–17.0)
Immature Granulocytes: 5 %
Lymphocytes Relative: 9 %
Lymphs Abs: 0.3 10*3/uL — ABNORMAL LOW (ref 0.7–4.0)
MCH: 35.7 pg — ABNORMAL HIGH (ref 26.0–34.0)
MCHC: 31.5 g/dL (ref 30.0–36.0)
MCV: 113.2 fL — ABNORMAL HIGH (ref 80.0–100.0)
Monocytes Absolute: 0.7 10*3/uL (ref 0.1–1.0)
Monocytes Relative: 21 %
Neutro Abs: 2.1 10*3/uL (ref 1.7–7.7)
Neutrophils Relative %: 63 %
Platelets: 34 10*3/uL — ABNORMAL LOW (ref 150–400)
RBC: 2.58 MIL/uL — ABNORMAL LOW (ref 4.22–5.81)
RDW: 17.3 % — ABNORMAL HIGH (ref 11.5–15.5)
WBC: 3.4 10*3/uL — ABNORMAL LOW (ref 4.0–10.5)
nRBC: 0 % (ref 0.0–0.2)

## 2023-01-11 LAB — BASIC METABOLIC PANEL
Anion gap: 8 (ref 5–15)
BUN: 27 mg/dL — ABNORMAL HIGH (ref 8–23)
CO2: 26 mmol/L (ref 22–32)
Calcium: 9.4 mg/dL (ref 8.9–10.3)
Chloride: 103 mmol/L (ref 98–111)
Creatinine, Ser: 0.76 mg/dL (ref 0.61–1.24)
GFR, Estimated: >60 mL/min (ref >60)
Glucose, Bld: 84 mg/dL (ref 70–99)
Potassium: 4.3 mmol/L (ref 3.5–5.1)
Sodium: 137 mmol/L (ref 135–145)

## 2023-01-11 MED ORDER — METHYLPHENIDATE HCL 5 MG PO TABS: 2.5000 mg | ORAL_TABLET | Freq: Two times a day (BID) | ORAL | 0 refills | Status: DC

## 2023-01-11 NOTE — ED Provider Notes (Signed)
[place] EMERGENCY DEPARTMENT AT [facility] Provider Note   CSN: [id number] Arrival date & time: 01/11/23  1906     History  Chief Complaint  Patient presents with   Bonnetta Barry Valeria Krisko is a 72 y.o. male.  Patient is a 72 year old male with a history of hypertension, hyperlipidemia, hypothyroidism and multiple myeloma.  He completed intensive chemotherapy treatment for the multiple myeloma and per his daughter who is at bedside, the multiple myeloma has resolved but he has had some ongoing issues.  Since February he has had some memory issues as well as increased unsteadiness with walking.  He has had multiple falls.  He has had some ongoing dizziness.  He has had some low blood counts.  He was admitted to [facility] about a week ago.  He had a fall today.  Per EMS, he fell from his wheelchair and hit his head on a tile floor.  He denies any injuries.  He has some left-sided rib pain although he says this was from a prior fall.  His daughter states that he has been complaining of little bit more dizziness than his baseline.  He does not report any other recent illnesses.       Home Medications Prior to Admission medications   Medication Sig Start Date End Date Taking? Authorizing Provider  acetaminophen (TYLENOL) 650 MG CR tablet Take 650 mg by mouth every 6 (six) hours as needed for pain.    [provider]  calcium-vitamin D (OSCAL WITH D) 500-200 MG-UNIT tablet Take 1 tablet by mouth daily. 08/14/22   [name], Georgina Quint, MD  cetirizine (ZYRTEC) 10 MG tablet Take 10 mg by mouth daily.    [provider]  Cholecalciferol (VITAMIN D3) 25 MCG (1000 UT) capsule Take 1,000 Units by mouth daily.    [provider]  dapsone 100 MG tablet Take 100 mg by mouth daily. Faxed order to [facility], Leighton Roach, MD  dexamethasone (DECADRON) 4 MG tablet Take 2.5 tablets (10 mg total) by mouth as directed. Day of treatment and Day after  treatment as of 5/22 09/20/21   Ladene Artist, MD  docusate sodium (COLACE) 100 MG capsule Take 100 mg by mouth daily as needed for mild constipation or moderate constipation.    [provider]  escitalopram (LEXAPRO) 10 MG tablet Take 1 tablet (10 mg total) by mouth daily. 06/27/22   [name], Georgina Quint, MD  levothyroxine (SYNTHROID) 150 MCG tablet TAKE 1 TABLET BY MOUTH EVERY MORNING ON AN EMPTY STOMACH 02/09/22   [name], Georgina Quint, MD  LORazepam (ATIVAN) 0.5 MG tablet Take 0.5 mg by mouth at bedtime as needed for anxiety.    [provider]  methylphenidate (RITALIN) 5 MG tablet Take 0.5 tablets (2.5 mg total) by mouth 2 (two) times daily with breakfast and lunch. At breakfast and lunch 01/11/23   Fletcher Anon, NP  traMADol (ULTRAM) 50 MG tablet Take 1 tablet (50 mg total) by mouth every 6 (six) hours as needed. 01/03/23 02/02/23  Fargo, Amy E, NP  triamcinolone ointment (KENALOG) 0.1 % Apply to affected area topically 2 (two) times daily. 08/16/22   [name], Georgina Quint, MD  valACYclovir (VALTREX) 500 MG tablet Take 500 mg by mouth 2 (two) times daily.    [provider]  prochlorperazine (COMPAZINE) 10 MG tablet Take 1 tablet (10 mg total) by mouth every 6 (six) hours as needed (Nausea or vomiting). 12/14/20 12/12/21  Magrinat,  Valentino Hue, MD      Allergies    Atorvastatin, Rosuvastatin, Crestor [rosuvastatin calcium], Lipitor [atorvastatin calcium], and Septra [sulfamethoxazole-trimethoprim]    Review of Systems   Review of Systems  Unable to perform ROS: Mental status change    Physical Exam Updated Vital Signs BP (!) 165/74   Pulse 75   Temp 98.4 F (36.9 C) (Oral)   Resp (!) 23   Ht  (1.702 m)   Wt 75.8 kg   SpO2 96%   BMI 26.16 kg/m  Physical Exam Constitutional:      Appearance: He is well-developed.  HENT:     Head: Normocephalic and atraumatic.  Eyes:     Pupils: Pupils are equal, round, and reactive to light.  Neck:     Comments:  No pain to the cervical, thoracic or lumbosacral spine, no step-offs or deformities Cardiovascular:     Rate and Rhythm: Normal rate and regular rhythm.     Heart sounds: Normal heart sounds.  Pulmonary:     Effort: Pulmonary effort is normal. No respiratory distress.     Breath sounds: Normal breath sounds. No wheezing or rales.  Chest:     Chest wall: Tenderness (Tenderness to the left chest wall, no crepitus or deformity, no external visualized) present.  Abdominal:     General: Bowel sounds are normal.     Palpations: Abdomen is soft.     Tenderness: There is no abdominal tenderness. There is no guarding or rebound.  Musculoskeletal:        General: Normal range of motion.     Comments: No pain on palpation or range of motion of extremities  Lymphadenopathy:     Cervical: No cervical adenopathy.  Skin:    General: Skin is warm and dry.     Findings: No rash.  Neurological:     General: No focal deficit present.     Mental Status: He is alert.     Comments: Oriented to person and place     ED Results / Procedures / Treatments   Labs (all labs ordered are listed, but only abnormal results are displayed) Labs Reviewed  BASIC METABOLIC PANEL - Abnormal; Notable for the following components:      Result Value   BUN 27 (*)    All other components within normal limits  CBC WITH DIFFERENTIAL/PLATELET - Abnormal; Notable for the following components:   WBC 3.4 (*)    RBC 2.58 (*)    Hemoglobin 9.2 (*)    HCT 29.2 (*)    MCV 113.2 (*)    MCH 35.7 (*)    RDW 17.3 (*)    Platelets 34 (*)    Lymphs Abs 0.3 (*)    Abs Immature Granulocytes 0.15 (*)    All other components within normal limits  URINALYSIS, W/ REFLEX TO CULTURE (INFECTION SUSPECTED)    EKG None  Radiology CT Head Wo Contrast  Result Date: 01/11/2023 CLINICAL DATA:  Fall, head and neck trauma. EXAM: CT HEAD WITHOUT CONTRAST CT CERVICAL SPINE WITHOUT CONTRAST TECHNIQUE: Multidetector CT imaging of the head  and cervical spine was performed following the standard protocol without intravenous contrast. Multiplanar CT image reconstructions of the cervical spine were also generated. RADIATION DOSE REDUCTION: This exam was performed according to the departmental dose-optimization program which includes automated exposure control, adjustment of the mA and/or kV according to patient size and/or use of iterative reconstruction technique. COMPARISON:  11/10/2022. FINDINGS: CT HEAD FINDINGS Brain: No acute  intracranial hemorrhage, midline shift or mass effect. No extra-axial fluid collection. Mild diffuse atrophy is noted. Periventricular white matter hypodensities are present bilaterally. No hydrocephalus. Vascular: No hyperdense vessel or unexpected calcification. Skull: Normal. Negative for fracture or focal lesion. Sinuses/Orbits: No acute finding. Other: None. CT CERVICAL SPINE FINDINGS Alignment: There is mild anterolisthesis at C4-C5. Skull base and vertebrae: No acute fracture. No primary bone lesion or focal pathologic process. Soft tissues and spinal canal: No prevertebral fluid or swelling. No visible canal hematoma. Disc levels: Intervertebral disc space narrowing and endplate osteophyte formation is present from C5-C7. Mild facet arthropathy is noted. Upper chest: No acute abnormality. Other: None. IMPRESSION: 1. No acute intracranial process. 2. Atrophy with chronic microvascular ischemic changes. 3. Multilevel degenerative changes in the cervical spine without evidence of acute fracture. Electronically Signed   By: Thornell Sartorius M.D.   On: 01/11/2023 21:12   CT Cervical Spine Wo Contrast  Result Date: 01/11/2023 CLINICAL DATA:  Fall, head and neck trauma. EXAM: CT HEAD WITHOUT CONTRAST CT CERVICAL SPINE WITHOUT CONTRAST TECHNIQUE: Multidetector CT imaging of the head and cervical spine was performed following the standard protocol without intravenous contrast. Multiplanar CT image reconstructions of the  cervical spine were also generated. RADIATION DOSE REDUCTION: This exam was performed according to the departmental dose-optimization program which includes automated exposure control, adjustment of the mA and/or kV according to patient size and/or use of iterative reconstruction technique. COMPARISON:  11/10/2022. FINDINGS: CT HEAD FINDINGS Brain: No acute intracranial hemorrhage, midline shift or mass effect. No extra-axial fluid collection. Mild diffuse atrophy is noted. Periventricular white matter hypodensities are present bilaterally. No hydrocephalus. Vascular: No hyperdense vessel or unexpected calcification. Skull: Normal. Negative for fracture or focal lesion. Sinuses/Orbits: No acute finding. Other: None. CT CERVICAL SPINE FINDINGS Alignment: There is mild anterolisthesis at C4-C5. Skull base and vertebrae: No acute fracture. No primary bone lesion or focal pathologic process. Soft tissues and spinal canal: No prevertebral fluid or swelling. No visible canal hematoma. Disc levels: Intervertebral disc space narrowing and endplate osteophyte formation is present from C5-C7. Mild facet arthropathy is noted. Upper chest: No acute abnormality. Other: None. IMPRESSION: 1. No acute intracranial process. 2. Atrophy with chronic microvascular ischemic changes. 3. Multilevel degenerative changes in the cervical spine without evidence of acute fracture. Electronically Signed   By: Thornell Sartorius M.D.   On: 01/11/2023 21:12   DG Chest 2 View  Result Date: 01/11/2023 CLINICAL DATA:  Left side rib pain EXAM: CHEST - 2 VIEW COMPARISON:  10/29/2022 FINDINGS: Elevation of the right hemidiaphragm. Moderate-sized hiatal hernia. Heart is normal size. No confluent airspace opacities or effusions. No acute bony abnormality. IMPRESSION: No active cardiopulmonary disease. Electronically Signed   By: Charlett Nose M.D.   On: 01/11/2023 21:12    Procedures Procedures    Medications Ordered in ED Medications - No data to  display  ED Course/ Medical Decision Making/ A&P                             Medical Decision Making Amount and/or Complexity of Data Reviewed Labs: ordered. Radiology: ordered.   Patient is a 72 year old male who presents after a fall.  He fell out of his wheelchair.  He said he was getting down to get his shoe and got dizzy and fell.  He has had some ongoing dizziness for several months.  He does not have any focal neurologic deficits.  He does not  have any visible head trauma although he does say he hit his head.  He is not on anticoagulants.  No fevers or other recent illnesses.  He had a head CT which showed no acute abnormalities.  CT of the cervical spine shows no evidence of bony injury to the spine.  Chest x-ray shows no evidence of obvious rib fractures.  No pneumothorax.  No pneumonia.  This was interpreted by me and confirmed by the radiologist.  He is neurologically intact.  His labs show some pancytopenia which is similar to prior values based on chart review.  Platelet count is low but similar to prior values.  Other labs are nonconcerning.  He was discharged home in good condition.  He was encouraged to follow-up with his doctor.  His wife says Dr. Truett Perna is coming to do a visit this week.  Return precautions were given.  Final Clinical Impression(s) / ED Diagnoses Final diagnoses:  Fall, initial encounter  Dizziness  Minor head injury, initial encounter    Rx / DC Orders ED Discharge Orders     None         Rolan Bucco, MD 01/11/23 2206

## 2023-01-11 NOTE — Telephone Encounter (Signed)
RX was approved earlier by Fletcher Anon, NP.  Medical assistants are unable to refuse controlled substances, I will send to [place] to refuse

## 2023-01-11 NOTE — Telephone Encounter (Signed)
Call from nurse, Deanna Artis at [facility]. CBC results coming back in: Hgb 9.4 WBC 3.10 Platelets 19,000 He has no fever or s/s infection and no bleeding.

## 2023-01-11 NOTE — ED Triage Notes (Signed)
Pt had a fall from wheelchair. Pt hit his head on a tile floor. Pt complains of left side pain. Denies head/neck pain. Pt had blood work drawn earlier today and had a low platelet count. Facility concerned for bleeding due to low platelets. Pt not on blood thinners. History of chemo for myeloma but recently stopped treatment for it.   EMS VS 142/66 60HR  CBG 116 98% RA

## 2023-01-11 NOTE — Telephone Encounter (Signed)
Called [facility] to f/u on CBC results from 4/24. Nurse Deanna Artis reports they were not drawn. She will draw today and fax results tomorrow.

## 2023-01-15 ENCOUNTER — Other Ambulatory Visit: Payer: Self-pay | Admitting: *Deleted

## 2023-01-15 DIAGNOSIS — C9000 Multiple myeloma not having achieved remission: Secondary | ICD-10-CM

## 2023-01-18 ENCOUNTER — Inpatient Hospital Stay: Payer: Medicare Other

## 2023-01-18 ENCOUNTER — Inpatient Hospital Stay: Payer: Medicare Other | Attending: Oncology

## 2023-01-18 ENCOUNTER — Inpatient Hospital Stay (HOSPITAL_BASED_OUTPATIENT_CLINIC_OR_DEPARTMENT_OTHER): Payer: Medicare Other | Admitting: Oncology

## 2023-01-18 VITALS — BP 131/69 | HR 78 | Temp 98.2°F | Resp 18 | Ht 67.0 in | Wt 170.0 lb

## 2023-01-18 VITALS — BP 129/72 | HR 59 | Temp 98.2°F | Resp 18

## 2023-01-18 DIAGNOSIS — C9000 Multiple myeloma not having achieved remission: Secondary | ICD-10-CM

## 2023-01-18 DIAGNOSIS — D61818 Other pancytopenia: Secondary | ICD-10-CM | POA: Insufficient documentation

## 2023-01-18 LAB — CMP (CANCER CENTER ONLY)
ALT: 14 U/L (ref 0–44)
AST: 19 U/L (ref 15–41)
Albumin: 3.9 g/dL (ref 3.5–5.0)
Alkaline Phosphatase: 67 U/L (ref 38–126)
Anion gap: 6 (ref 5–15)
BUN: 16 mg/dL (ref 8–23)
CO2: 26 mmol/L (ref 22–32)
Calcium: 9.6 mg/dL (ref 8.9–10.3)
Chloride: 106 mmol/L (ref 98–111)
Creatinine: 0.8 mg/dL (ref 0.61–1.24)
GFR, Estimated: >60 mL/min (ref >60)
Glucose, Bld: 79 mg/dL (ref 70–99)
Potassium: 3.8 mmol/L (ref 3.5–5.1)
Sodium: 138 mmol/L (ref 135–145)
Total Bilirubin: 0.4 mg/dL (ref 0.3–1.2)
Total Protein: 6.1 g/dL — ABNORMAL LOW (ref 6.5–8.1)

## 2023-01-18 LAB — CBC WITH DIFFERENTIAL (CANCER CENTER ONLY)
Abs Immature Granulocytes: 0.03 10*3/uL (ref 0.00–0.07)
Basophils Absolute: 0 10*3/uL (ref 0.0–0.1)
Basophils Relative: 0 %
Eosinophils Absolute: 0.1 10*3/uL (ref 0.0–0.5)
Eosinophils Relative: 2 %
HCT: 28.9 % — ABNORMAL LOW (ref 39.0–52.0)
Hemoglobin: 9.3 g/dL — ABNORMAL LOW (ref 13.0–17.0)
Immature Granulocytes: 1 %
Lymphocytes Relative: 10 %
Lymphs Abs: 0.3 10*3/uL — ABNORMAL LOW (ref 0.7–4.0)
MCH: 36 pg — ABNORMAL HIGH (ref 26.0–34.0)
MCHC: 32.2 g/dL (ref 30.0–36.0)
MCV: 112 fL — ABNORMAL HIGH (ref 80.0–100.0)
Monocytes Absolute: 0.6 10*3/uL (ref 0.1–1.0)
Monocytes Relative: 21 %
Neutro Abs: 1.9 10*3/uL (ref 1.7–7.7)
Neutrophils Relative %: 66 %
Platelet Count: 35 10*3/uL — ABNORMAL LOW (ref 150–400)
RBC: 2.58 MIL/uL — ABNORMAL LOW (ref 4.22–5.81)
RDW: 17.5 % — ABNORMAL HIGH (ref 11.5–15.5)
WBC Count: 2.8 10*3/uL — ABNORMAL LOW (ref 4.0–10.5)
nRBC: 0 % (ref 0.0–0.2)

## 2023-01-18 LAB — SAMPLE TO BLOOD BANK

## 2023-01-18 MED ORDER — DEXTROSE 5 % IV SOLN: INTRAVENOUS | Status: DC

## 2023-01-18 MED ORDER — IMMUNE GLOBULIN (HUMAN) 20 GM/200ML IV SOLN
400.0000 mg/kg | Freq: Once | INTRAVENOUS | Status: AC
  Administered 2023-01-18: 30 g via INTRAVENOUS
  Filled 2023-01-18: qty 300

## 2023-01-18 MED ORDER — ACETAMINOPHEN 325 MG PO TABS
650.0000 mg | ORAL_TABLET | Freq: Once | ORAL | Status: AC
  Administered 2023-01-18: 650 mg via ORAL
  Filled 2023-01-18: qty 2

## 2023-01-18 MED ORDER — DIPHENHYDRAMINE HCL 25 MG PO CAPS
25.0000 mg | ORAL_CAPSULE | Freq: Once | ORAL | Status: AC
  Administered 2023-01-18: 25 mg via ORAL
  Filled 2023-01-18: qty 1

## 2023-01-18 NOTE — Progress Notes (Signed)
[facility] OFFICE PROGRESS NOTE   Diagnosis: Multiple myeloma  INTERVAL HISTORY:   Andre [name] returns for a scheduled visit.  He is now living in a skilled nursing facility.  He is here today with his wife.  He completed treatment with IVIG on 01/18/2023. His wife reports he is experienced multiple falls.  He was seen in the emergency room after a fall on 01/11/2023.  A head CT and CT of the cervical spine revealed no acute change.  No fever or bleeding.  He remains confused.  He is participating in physical therapy and he eats in the dining hall.  No allergic reaction with dapsone.  Objective:  Vital signs in last 24 hours:  Blood pressure 131/69, pulse 78, temperature 98.2 F (36.8 C), temperature source Oral, resp. rate 18, height 5\' 7"  (1.702 m), weight 170 lb (77.1 kg), SpO2 96 %.    HEENT: No thrush, 1 petechiae the left and right buccal mucosa Resp: Lungs clear bilaterally Cardio: Regular rate and rhythm GI: Nontender, no hepatosplenomegaly Vascular: Trace bilateral lower leg edema Neuro: Alert, oriented to day.  Not oriented to diagnosis.   Lab Results:  Lab Results  Component Value Date   WBC 2.8 (L) 01/18/2023   HGB 9.3 (L) 01/18/2023   HCT 28.9 (L) 01/18/2023   MCV 112.0 (H) 01/18/2023   PLT 35 (L) 01/18/2023   NEUTROABS 1.9 01/18/2023    CMP  Lab Results  Component Value Date   NA 138 01/18/2023   K 3.8 01/18/2023   CL 106 01/18/2023   CO2 26 01/18/2023   GLUCOSE 79 01/18/2023   BUN 16 01/18/2023   CREATININE 0.80 01/18/2023   CALCIUM 9.6 01/18/2023   PROT 6.1 (L) 01/18/2023   ALBUMIN 3.9 01/18/2023   AST 19 01/18/2023   ALT 14 01/18/2023   ALKPHOS 67 01/18/2023   BILITOT 0.4 01/18/2023   GFRNONAA >60 01/18/2023   GFRAA >60 06/15/2020    Medications: I have reviewed the patient's current medications.   Assessment/Plan: Multiple myeloma- IgG lambda  bortezomib (subcutaneously), lenalidomide, and dexamethasone, with repeat  bone marrow biopsy May of 2012 showing 10% plasmacytosis   (2) high-dose chemotherapy with BCNU and melphalan at [facility], followed by stem cell rescue July of 2012   (3) on zoledronic acid started December of 2012, initially monthly, currently given every 3 months, most recent dose  12/07/2015   (4) low-dose lenalidomide resumed April 2013, interrupted several times.  Resumed again on 02/19/2013, ata dose of 5 mg daily, 21 days on and 7 days off, later further reduced to 7 days on, 7 days off   (5) CNS symptoms and abnormal brain MRI extensively evaluated by neurology with no definitive diagnosis established   (6) rising M spike noted June 2014 but did not meet criteria for carfilzomib study   (7) on lenalidomide 25 mg daily, 14 days on, 7 days off, starting 04/18/2013, interrupted December 2014 because of rash;             (a) resumed January 2015 at 10 mg/ day at 21 days on/ 7 days off             (Raymone) starting 08/18/2014 decreased to 10 mg/ day 14 days on, 7 days off because of cytopenias             (c) as of February 2016 was on 5 mg daily 7 days on 7 days off             (  d) lenalidomide discontinued December 2016 with evidence of disease progression   (8) transient global amnesia 05/29/2015, resolved without intervention   (9) starting PVD 10/25/2015 w ASA 325 thromboprophylaxis, valacyclovir prophylaxis, last dose 12/17/2015             (a) pomalidomide 4 mg/d days 1-14             (Bud) bortezomib sQ days 2,5,9,12 of each 21 day cycle             (c) dexamethasome 20 mg two days a week             (d) dexamethasone bortezomib and pomalidomide discontinued late December 2018 with poor tolerance   (10) metapneumovirus pneumonia April 2017             (a) completing course of steroids and week of bactrim mid April 2017   (11) status post second autologous transplant at [facility] 02/04/2016(preparatory regimen melphalan 200 mg/m)             (a) received twelve-month vaccinations 03/14/2017  (DPT, Haemophilus, Pneumovax 13, polio)             (Kervens) 14 months injections 05/04/2016 include DTaP, Hib conjugate, HepB energex Akaash 20 mcg/ml, Prevnar 13             (c) 24 month vaccines due at [facility] June 2019   (12) maintenance therapy started November 2017, consisting of             (a) bortezomib 1.3 mg/M2 every 14 days, first dose 07/27/2016             (Kristy) pomalidomide 1 mg days 1-21 Q28 days, started 07/19/2016             (c) zolendronate monthly started 07/27/2016 (previously Q 3 months) however patient unable to tolerate, and changed back to q3 months in April, 2018             (d) Bortezomib changed to monthly as of June 2018 because of tolerance issues, however discontinued after 09/14/2017 dose because of a rise in his M spike             (e) pomalidomide held after 10/18/2017 in preparation for possible study at [facility] (venetoclax)--never resumed             (f) with numbers actually improved off treatment, resumed every 2-week bortezomib 12/25/2017             (g) changed to every 3-week bortezomib as of 03/17/2019             (h) briefly on weekly treatments times 22 October 2020 due to increase in M spike             (I) maintenance therapy discontinued with evidence of progression   (13) bortezomib/daratumumab/dexamethasone started 12/20/2020.             (a) Decadron dose dropped to 10 mg day of and day following treatment starting May 2022             (Carrie) day 8 bortezomib omitted beginning with the June cycle per patient preference             (C) treatment changed to monthly daratumumab/Velcade/Decadron beginning 06/27/2021-patient decision (14) weekly Velcade/Cytoxan beginning 11/14/2021 (15) changed to Cytoxan/carfilzomib/Decadron 12/19/2021-treatment placed on hold 05/15/2022, carfilzomib 05/24/2022 and 05/31/2022 16.  Leukocyte a pheresis procedure at [facility] 06/15/2022 17.  Treatment resumed with Cytoxan/carfilzomib/Decadron 06/28/2022, last given 09/06/2022  18.  CAR-T  09/26/2022 with Carvykti 19.  CRS and ICANS following CAR-T therapy, status post a Decadron taper completed 10/08/2022 20.  Presentation to the emergency room 10/30/2022 with failure to thrive and a fall  21.  Pancytopenia secondary to multiple myeloma and CAR-T therapy 22.  Bone marrow biopsy 11/13/2022-mildly hypocellular bone marrow with relative erythroid hyperplasia and decreased megakaryocytes; no plasma cells identified by differential count or CD138 immunohistochemical stain; flow cytometry negative for a clonal plasma cell population.  23.  Cognitive impairment predating CAR-T therapy and worsened following CAR-T therapy      Disposition: Andre [name] is now approximately 4 months out from undergoing CAR-T therapy for treatment of refractory myeloma.  He is in remission from myeloma.  We will follow-up on the myeloma panel from today.  He has persistent pancytopenia.  He will have a CBC at [facility] in 2 weeks.  Andre [name] will continue monthly IVIG therapy.  He will return for an office visit in 1 month.  He had cognitive impairment prior to the CAR-T therapy.  This has worsened.  Hopefully this will improve over time.  He will continue close supervision in the skilled nursing facility.  Thornton Papas, MD  01/18/2023  2:31 PM

## 2023-01-18 NOTE — Patient Instructions (Signed)

## 2023-01-20 LAB — IGG: IgG (Immunoglobin G), Serum: 508 mg/dL — ABNORMAL LOW (ref 603–1613)

## 2023-01-24 ENCOUNTER — Encounter: Payer: Self-pay | Admitting: Physician Assistant

## 2023-02-01 LAB — CBC AND DIFFERENTIAL
HCT: 28 — AB (ref 41–53)
Hemoglobin: 9.1 — AB (ref 13.5–17.5)
Neutrophils Absolute: 1.6
Platelets: 28 10*3/uL — AB (ref 150–400)
WBC: 2.6

## 2023-02-01 LAB — CBC: RBC: 2.53 — AB (ref 3.87–5.11)

## 2023-02-07 ENCOUNTER — Encounter: Payer: Self-pay | Admitting: Physician Assistant

## 2023-02-08 ENCOUNTER — Other Ambulatory Visit: Payer: Self-pay | Admitting: Adult Health

## 2023-02-08 MED ORDER — METHYLPHENIDATE HCL 5 MG PO TABS: 2.5000 mg | ORAL_TABLET | Freq: Two times a day (BID) | ORAL | 0 refills | Status: DC

## 2023-02-13 LAB — LIPID PANEL
Cholesterol: 178 (ref 0–200)
HDL: 42 (ref 35–70)
LDL Cholesterol: 113
Triglycerides: 167 — AB (ref 40–160)

## 2023-02-14 ENCOUNTER — Inpatient Hospital Stay: Payer: Medicare Other

## 2023-02-14 ENCOUNTER — Inpatient Hospital Stay (HOSPITAL_BASED_OUTPATIENT_CLINIC_OR_DEPARTMENT_OTHER): Payer: Medicare Other | Admitting: Nurse Practitioner

## 2023-02-14 ENCOUNTER — Encounter: Payer: Self-pay | Admitting: Nurse Practitioner

## 2023-02-14 VITALS — BP 123/57 | HR 65 | Temp 98.2°F | Resp 18

## 2023-02-14 VITALS — BP 104/61 | HR 96 | Temp 98.1°F | Resp 18 | Ht 67.0 in | Wt 173.0 lb

## 2023-02-14 DIAGNOSIS — C9000 Multiple myeloma not having achieved remission: Secondary | ICD-10-CM

## 2023-02-14 LAB — CMP (CANCER CENTER ONLY)
ALT: 18 U/L (ref 0–44)
AST: 22 U/L (ref 15–41)
Albumin: 3.9 g/dL (ref 3.5–5.0)
Alkaline Phosphatase: 70 U/L (ref 38–126)
Anion gap: 10 (ref 5–15)
BUN: 17 mg/dL (ref 8–23)
CO2: 25 mmol/L (ref 22–32)
Calcium: 9.4 mg/dL (ref 8.9–10.3)
Chloride: 104 mmol/L (ref 98–111)
Creatinine: 0.91 mg/dL (ref 0.61–1.24)
GFR, Estimated: >60 mL/min (ref >60)
Glucose, Bld: 111 mg/dL — ABNORMAL HIGH (ref 70–99)
Potassium: 4 mmol/L (ref 3.5–5.1)
Sodium: 139 mmol/L (ref 135–145)
Total Bilirubin: 0.5 mg/dL (ref 0.3–1.2)
Total Protein: 6.2 g/dL — ABNORMAL LOW (ref 6.5–8.1)

## 2023-02-14 LAB — CBC WITH DIFFERENTIAL (CANCER CENTER ONLY)
Abs Immature Granulocytes: 0.02 10*3/uL (ref 0.00–0.07)
Basophils Absolute: 0 10*3/uL (ref 0.0–0.1)
Basophils Relative: 0 %
Eosinophils Absolute: 0.1 10*3/uL (ref 0.0–0.5)
Eosinophils Relative: 3 %
HCT: 27.8 % — ABNORMAL LOW (ref 39.0–52.0)
Hemoglobin: 9.2 g/dL — ABNORMAL LOW (ref 13.0–17.0)
Immature Granulocytes: 1 %
Lymphocytes Relative: 12 %
Lymphs Abs: 0.3 10*3/uL — ABNORMAL LOW (ref 0.7–4.0)
MCH: 35.9 pg — ABNORMAL HIGH (ref 26.0–34.0)
MCHC: 33.1 g/dL (ref 30.0–36.0)
MCV: 108.6 fL — ABNORMAL HIGH (ref 80.0–100.0)
Monocytes Absolute: 0.3 10*3/uL (ref 0.1–1.0)
Monocytes Relative: 11 %
Neutro Abs: 2.1 10*3/uL (ref 1.7–7.7)
Neutrophils Relative %: 73 %
Platelet Count: 38 10*3/uL — ABNORMAL LOW (ref 150–400)
RBC: 2.56 MIL/uL — ABNORMAL LOW (ref 4.22–5.81)
RDW: 18.1 % — ABNORMAL HIGH (ref 11.5–15.5)
WBC Count: 2.9 10*3/uL — ABNORMAL LOW (ref 4.0–10.5)
nRBC: 0 % (ref 0.0–0.2)

## 2023-02-14 MED ORDER — IMMUNE GLOBULIN (HUMAN) 20 GM/200ML IV SOLN
400.0000 mg/kg | Freq: Once | INTRAVENOUS | Status: AC
  Administered 2023-02-14: 30 g via INTRAVENOUS
  Filled 2023-02-14: qty 300

## 2023-02-14 MED ORDER — ACETAMINOPHEN 325 MG PO TABS
650.0000 mg | ORAL_TABLET | Freq: Once | ORAL | Status: AC
  Administered 2023-02-14: 650 mg via ORAL
  Filled 2023-02-14: qty 2

## 2023-02-14 MED ORDER — DIPHENHYDRAMINE HCL 25 MG PO CAPS
25.0000 mg | ORAL_CAPSULE | Freq: Once | ORAL | Status: AC
  Administered 2023-02-14: 25 mg via ORAL
  Filled 2023-02-14: qty 1

## 2023-02-14 NOTE — Progress Notes (Signed)
VSS at discharge after 30 minute post-observation

## 2023-02-14 NOTE — Progress Notes (Signed)
[facility] OFFICE PROGRESS NOTE   Diagnosis: Multiple myeloma  INTERVAL HISTORY:   Andre [name] returns as scheduled.  He continues monthly IVIG.  No interim illnesses or infections.  No fever.  He has a good appetite.  His wife notes he is falling less.  He is working with physical therapy.  He denies any reaction to the IVIG infusions.  No bleeding.  Objective:  Vital signs in last 24 hours:  Blood pressure 104/61, pulse 96, temperature 98.1 F (36.7 C), temperature source Oral, resp. rate 18, height 5\' 7"  (1.702 m), weight 173 lb (78.5 kg), SpO2 96 %.    HEENT: No thrush or ulcers. Resp: Lungs clear bilaterally. Cardio: Regular rate and rhythm. GI: No hepatosplenomegaly. Vascular: Trace edema lower leg bilaterally. Neuro: Alert, follows commands.   Lab Results:  Lab Results  Component Value Date   WBC 2.9 (L) 02/14/2023   HGB 9.2 (L) 02/14/2023   HCT 27.8 (L) 02/14/2023   MCV 108.6 (H) 02/14/2023   PLT 38 (L) 02/14/2023   NEUTROABS 2.1 02/14/2023    Imaging:  No results found.  Medications: I have reviewed the patient's current medications.  Assessment/Plan: Multiple myeloma- IgG lambda  bortezomib (subcutaneously), lenalidomide, and dexamethasone, with repeat bone marrow biopsy May of 2012 showing 10% plasmacytosis   (2) high-dose chemotherapy with BCNU and melphalan at [facility], followed by stem cell rescue July of 2012   (3) on zoledronic acid started December of 2012, initially monthly, currently given every 3 months, most recent dose  12/07/2015   (4) low-dose lenalidomide resumed April 2013, interrupted several times.  Resumed again on 02/19/2013, ata dose of 5 mg daily, 21 days on and 7 days off, later further reduced to 7 days on, 7 days off   (5) CNS symptoms and abnormal brain MRI extensively evaluated by neurology with no definitive diagnosis established   (6) rising M spike noted June 2014 but did not meet criteria for carfilzomib  study   (7) on lenalidomide 25 mg daily, 14 days on, 7 days off, starting 04/18/2013, interrupted December 2014 because of rash;             (a) resumed January 2015 at 10 mg/ day at 21 days on/ 7 days off             (Andre [name]) starting 08/18/2014 decreased to 10 mg/ day 14 days on, 7 days off because of cytopenias             (c) as of February 2016 was on 5 mg daily 7 days on 7 days off             (d) lenalidomide discontinued December 2016 with evidence of disease progression   (8) transient global amnesia 05/29/2015, resolved without intervention   (9) starting PVD 10/25/2015 w ASA 325 thromboprophylaxis, valacyclovir prophylaxis, last dose 12/17/2015             (a) pomalidomide 4 mg/d days 1-14             (Andre [name]) bortezomib sQ days 2,5,9,12 of each 21 day cycle             (c) dexamethasome 20 mg two days a week             (d) dexamethasone bortezomib and pomalidomide discontinued late December 2018 with poor tolerance   (10) metapneumovirus pneumonia April 2017             (a) completing course of  steroids and week of bactrim mid April 2017   (11) status post second autologous transplant at [facility] 02/04/2016(preparatory regimen melphalan 200 mg/m)             (a) received twelve-month vaccinations 03/14/2017 (DPT, Haemophilus, Pneumovax 13, polio)             (Andre [name]) 14 months injections 05/04/2016 include DTaP, Hib conjugate, HepB energex Andre [name] 20 mcg/ml, Prevnar 13             (c) 24 month vaccines due at [facility] June 2019   (12) maintenance therapy started November 2017, consisting of             (a) bortezomib 1.3 mg/M2 every 14 days, first dose 07/27/2016             (Andre [name]) pomalidomide 1 mg days 1-21 Q28 days, started 07/19/2016             (c) zolendronate monthly started 07/27/2016 (previously Q 3 months) however patient unable to tolerate, and changed back to q3 months in April, 2018             (d) Bortezomib changed to monthly as of June 2018 because of tolerance issues, however discontinued  after 09/14/2017 dose because of a rise in his M spike             (e) pomalidomide held after 10/18/2017 in preparation for possible study at [facility] (venetoclax)--never resumed             (f) with numbers actually improved off treatment, resumed every 2-week bortezomib 12/25/2017             (g) changed to every 3-week bortezomib as of 03/17/2019             (h) briefly on weekly treatments times 22 October 2020 due to increase in M spike             (I) maintenance therapy discontinued with evidence of progression   (13) bortezomib/daratumumab/dexamethasone started 12/20/2020.             (a) Decadron dose dropped to 10 mg day of and day following treatment starting May 2022             (Andre [name]) day 8 bortezomib omitted beginning with the June cycle per patient preference             (C) treatment changed to monthly daratumumab/Velcade/Decadron beginning 06/27/2021-patient decision (14) weekly Velcade/Cytoxan beginning 11/14/2021 (15) changed to Cytoxan/carfilzomib/Decadron 12/19/2021-treatment placed on hold 05/15/2022, carfilzomib 05/24/2022 and 05/31/2022 16.  Leukocyte a pheresis procedure at [facility] 06/15/2022 17.  Treatment resumed with Cytoxan/carfilzomib/Decadron 06/28/2022, last given 09/06/2022 18.  CAR-T 09/26/2022 with Carvykti 19.  CRS and ICANS following CAR-T therapy, status post a Decadron taper completed 10/08/2022 20.  Presentation to the emergency room 10/30/2022 with failure to thrive and a fall  21.  Pancytopenia secondary to multiple myeloma and CAR-T therapy 22.  Bone marrow biopsy 11/13/2022-mildly hypocellular bone marrow with relative erythroid hyperplasia and decreased megakaryocytes; no plasma cells identified by differential count or CD138 immunohistochemical stain; flow cytometry negative for a clonal plasma cell population.  23.  Cognitive impairment predating CAR-T therapy and worsened following CAR-T therapy    Disposition: Andre [name] appears unchanged.  We reviewed  the CBC.  He has persistent stable pancytopenia.  We will follow-up on the myeloma panel from today.  He will continue monthly IVIG therapy, infusion today.  He will return for lab, follow-up,  IVIG in 1 month.    Andre [name] ANP/GNP-BC   02/14/2023  8:39 AM

## 2023-02-14 NOTE — Progress Notes (Signed)
Patient seen by Lisa Thomas NP today  Vitals are within treatment parameters.  Labs reviewed by Lisa Thomas NP and are within treatment parameters.  Per physician team, patient is ready for treatment and there are NO modifications to the treatment plan.     

## 2023-02-14 NOTE — Patient Instructions (Signed)

## 2023-02-15 ENCOUNTER — Encounter: Payer: Self-pay | Admitting: Physician Assistant

## 2023-02-15 ENCOUNTER — Encounter: Payer: Self-pay | Admitting: Adult Health

## 2023-02-15 ENCOUNTER — Non-Acute Institutional Stay (SKILLED_NURSING_FACILITY): Payer: Medicare Other | Admitting: Adult Health

## 2023-02-15 ENCOUNTER — Ambulatory Visit (INDEPENDENT_AMBULATORY_CARE_PROVIDER_SITE_OTHER): Payer: Medicare Other | Admitting: Physician Assistant

## 2023-02-15 VITALS — BP 109/57 | HR 74 | Resp 20 | Ht 67.0 in

## 2023-02-15 DIAGNOSIS — R413 Other amnesia: Secondary | ICD-10-CM | POA: Diagnosis not present

## 2023-02-15 DIAGNOSIS — G620 Drug-induced polyneuropathy: Secondary | ICD-10-CM | POA: Diagnosis not present

## 2023-02-15 DIAGNOSIS — R600 Localized edema: Secondary | ICD-10-CM

## 2023-02-15 DIAGNOSIS — R296 Repeated falls: Secondary | ICD-10-CM

## 2023-02-15 DIAGNOSIS — D696 Thrombocytopenia, unspecified: Secondary | ICD-10-CM

## 2023-02-15 DIAGNOSIS — E039 Hypothyroidism, unspecified: Secondary | ICD-10-CM

## 2023-02-15 DIAGNOSIS — T451X5A Adverse effect of antineoplastic and immunosuppressive drugs, initial encounter: Secondary | ICD-10-CM

## 2023-02-15 DIAGNOSIS — F329 Major depressive disorder, single episode, unspecified: Secondary | ICD-10-CM

## 2023-02-15 LAB — KAPPA/LAMBDA LIGHT CHAINS
Kappa free light chain: <0.7 mg/L — ABNORMAL LOW (ref 3.3–19.4)
Kappa, lambda light chain ratio: UNDETERMINED
Lambda free light chains: <1.5 mg/L — ABNORMAL LOW (ref 5.7–26.3)

## 2023-02-15 NOTE — Progress Notes (Addendum)
Assessment/Plan:   Andre [name] is a very pleasant 72 y.o. year old RH male with a history of hypertension, hyperlipidemia, Multiple Myeloma, pancytopenia secondary to MM, seen today for evaluation of memory loss. He has been seen in the past at [facility], diagnosed with MCI. His memory difficulties date back to at least 2015. For Multiple Myeloma he has been treated with chemotherapy, on IVIG, and has received CART-T, which certainly has contributed to a certain extent to memory loss. MoCA today is 20/30. Prior MRI brain from 2016 personally reviewed  was remarkable for mild atrophy and stable microvascular disease.  Patient is alert, but weak. He is at ALF at this time.  Discussed with the patient and his wife the risks and benefit of initiating antidementia meds, although at this time, recommend to hold on these, due to side effects, including but not limited to  bradycardia, diarrhea, leg cramps, headaches, dizziness. Would favor Neuropsychological evaluation first, for diagnotic clarity and disease trajectory.     Memory Impairment without behavioral disturbance  MRI brain without contrast to assess for underlying structural abnormality and assess vascular load  Neurocognitive testing to further evaluate cognitive concerns and determine other underlying cause of memory changes, including potential contribution from sleep, anxiety, or depression  Continue to control mood as per PCP Recommend good control of cardiovascular risk factors Follow anemia with Hematology. MCV elevated,  suggestive of macrocytic anemia  Follow up MM with [facility] up in 3  months  Subjective:   The patient is accompanied by his wife  who supplements the history.   How long did patient have memory difficulties? " Andre [name] we got married 8 years ago he had some issues then, hard time remembering  information". Memory has been progressively worse since, especially over the last 2 years". Wife reports issues with  ability to take info and apply it.  repeats oneself?  Endorsed Disoriented when walking into a room? "Maybe he is disoriented when waking up but then gets better during the day" Leaving objects in unusual places? Wife says" I cannot tell because he is in ALF".   Wandering behavior?  denies   Any personality changes? "Got better, he was more impulsive, he used to say I am getting up, would rush and then fall" Any history of depression?:  Patient endorses. Hallucinations or paranoia?  Patient denies   Seizures?   Patient denies    Any sleep changes? " Not good". Reports lucid dreams sometimes , denies REM behavior or sleepwalking   Sleep apnea?  Patient denies   Any hygiene concerns?  Patient denies   Independent of bathing and dressing?  Endorsed  Does the patient needs help with medications? Facility is in charge   Who is in charge of the finances? Wife is in charge     Any changes in appetite?  denies     Patient have trouble swallowing? denies   Does the patient cook? No   Any headaches?   Occasionally   Ambulates with difficulty?  Yes, he is at fall risk, He is essentially wheelchair bound. Last presentation to ED on 12/2022 with unsteadiness in his feet and mild dizziness.  Vision changes? Endorsed, "needs eye checkup to adjust his reading glasses"  Unilateral weakness, numbness or tingling? He has chemo induced neuropathy Had possibly a TIA in the remote past , around 2013  Any tremors?   denies   Any anosmia?  denies   Any incontinence of urine? Endorsed. Needs diapers  Any bowel dysfunction? Endorsed, after the CART T, "but getting better"  Patient lives  at ALF History of heavy alcohol intake? denies   History of heavy tobacco use? Quit  Family history of dementia? No  Does patient drive?  No longer drives  Retired at 12  from  [employer]    Past Medical History:  Diagnosis Date   Allergy    Anemia    Arthritis    Asthma    no treatment x 20 years    Depression    Double vision    occurs at times    Duodenal ulcer    GERD (gastroesophageal reflux disease)    Hyperlipidemia    Hypertension    Hypothyroidism    Multiple myeloma 07/04/2011   Thyroid disease      Past Surgical History:  Procedure Laterality Date   BONE MARROW TRANSPLANT  2011   for MM   CARDIOLITE STUDY  11/25/2003   NORMAL   TONSILLECTOMY       Allergies  Allergen Reactions   Atorvastatin Other (See Comments)   Rosuvastatin     Other reaction(s): Liver Disorder   Crestor [Rosuvastatin Calcium]     ADVERSE EFFECTS ON LIVER   Lipitor [Atorvastatin Calcium]    Septra [Sulfamethoxazole-Trimethoprim] Rash    Current Outpatient Medications  Medication Instructions   acetaminophen (TYLENOL) 650 mg, Oral, Every 6 hours PRN   calcium carbonate (TUMS EX) 750 MG chewable tablet 1 tablet, Oral, Daily   calcium-vitamin D (OSCAL WITH D) 500-200 MG-UNIT tablet 1 tablet, Oral, Daily   cetirizine (ZYRTEC) 10 mg, Daily   dapsone 100 mg, Oral, Daily   docusate sodium (COLACE) 100 mg, Oral, Daily PRN   escitalopram (LEXAPRO) 10 mg, Oral, Daily   levothyroxine (SYNTHROID) 150 MCG tablet TAKE 1 TABLET BY MOUTH EVERY MORNING ON AN EMPTY STOMACH   methylphenidate (RITALIN) 2.5 mg, Oral, 2 times daily with breakfast and lunch, At breakfast and lunch   traMADol (ULTRAM) 50 mg, Oral, Every 6 hours PRN   triamcinolone ointment (KENALOG) 0.1 % Apply to affected area topically 2 (two) times daily.   valACYclovir (VALTREX) 500 mg, Oral, 2 times daily   Vitamin D3 1,000 Units, Oral, Daily     VITALS:   Vitals:   02/15/23 1322  BP: (!) 109/57  Pulse: 74  Resp: 20  SpO2: 99%  Height: 5\' 7"  (1.702 m)       PHYSICAL EXAM   HEENT:  Normocephalic, atraumatic. The mucous membranes are moist. The superficial temporal arteries are without ropiness or tenderness. Cardiovascular: Regular rate and rhythm. Lungs: Clear to auscultation bilaterally. Neck: There are no carotid  bruits noted bilaterally.  NEUROLOGICAL:    02/15/2023    3:00 PM  Montreal Cognitive Assessment   Visuospatial/ Executive (0/5) 2  Naming (0/3) 2  Attention: Read list of digits (0/2) 2  Attention: Read list of letters (0/1) 1  Attention: Serial 7 subtraction starting at 100 (0/3) 3  Language: Repeat phrase (0/2) 1  Language : Fluency (0/1) 0  Abstraction (0/2) 2  Delayed Recall (0/5) 2  Orientation (0/6) 5  Total 20  Adjusted Score (based on education) 20        No data to display           Orientation:  Alert and oriented to person, place and time. No aphasia or dysarthria. Fund of knowledge is appropriate. Recent and remote memory impaired. Attention and concentration are normal.  Able to name  objects and repeat phrases 1/2. Delayed recall 2/5 Cranial nerves: There is good facial symmetry. Extraocular muscles are intact and visual fields are full to confrontational testing. Speech is fluent and clear. No tongue deviation. Hearing is intact to conversational tone. Tone: Tone is good throughout. Sensation: Sensation is intact to light touch and pinprick throughout. Vibration is intact at the bilateral big toe.  Coordination: The patient has no difficulty with RAM's or FNF bilaterally. Normal finger to nose  Motor: Strength is 5/5 in the bilateral upper and lower extremities. There is no pronator drift. There are no fasciculations noted. DTR's: Deep tendon reflexes are 2/4 at the bilateral biceps, triceps, brachioradialis, patella and achilles.  Plantar responses are downgoing bilaterally. Gait and Station:  Unable to assess gait, patient on wheelchair due to debility      Thank you for allowing [place] the opportunity to participate in the care of this nice patient. Please do not hesitate to contact us for any questions or concerns.   Total time spent on today's visit was 71 minutes dedicated to this patient today, preparing to see patient, examining the patient, ordering tests  and/or medications and counseling the patient, documenting clinical information in the EHR or other health record, independently interpreting results and communicating results to the patient/family, discussing treatment and goals, answering patient's questions and coordinating care.  Cc:  [name], Georgina Quint, MD  Marlowe Kays 02/15/2023 3:50 PM

## 2023-02-15 NOTE — Patient Instructions (Signed)

## 2023-02-15 NOTE — Progress Notes (Signed)
Location:   [facility] Room Number: 142A Place of Service:  SNF [phone]) Provider:  Fletcher Anon, NP  [name], MD  Patient Care Team: [name], MD as PCP - General (Internal Medicine) Shirlean Schlein, MD as Consulting Physician (Neurology) Eddie Candle, MD as Consulting Physician (Internal Medicine) York Spaniel, MD (Inactive) as Consulting Physician (Neurology) Sharlett Iles, DDS as Referring Physician (Oral Surgery) Ladene Artist, MD as Consulting Physician (Oncology) Fletcher Anon, NP as Nurse Practitioner (Nurse Practitioner)  Extended Emergency Contact Information Primary Emergency Contact: Bhagat,[name] Address: [address]          [place], [place] [address] [name] of [place] Home Phone: [phone] Relation: Spouse  Code Status:  DNR Goals of care: Advanced Directive information    02/15/2023    2:16 PM  Advanced Directives  Does Patient Have a Medical Advance Directive? Yes  Type of Advance Directive Out of facility DNR (pink MOST or yellow form)  Does patient want to make changes to medical advance directive? No - Patient declined  Pre-existing out of facility DNR order (yellow form or pink MOST form) Yellow form placed in chart (order not valid for inpatient use)     Chief Complaint  Patient presents with   Medical Management of Chronic Issues    Routine follow up   Immunizations    Shingrix vaccine, pneumonia vaccine and COVID booster due.   Quality Metric Gaps    Medicare annual wellness and Hep C screening due    HPI:  Pt is a 72 y.o. male seen today for medical management of chronic diseases.   He was admitted to [facility] skilled care in April due to weakness and cognitive decline from treatment of Multiple Myeloma  There are no acute concerns for this visit.   He has had several falls in skilled care and has worked with therapy on gait and balance. Fall prec  in place. Has neuropathy from prior chemo.   Just seen by neurology 02/15/23 for memory loss. MRI of the brain ordered along with neurocognitive testing. Memory meds not recommended at the time.   Diagnosed with multiple myeloma in 2012. Has received chemo, dexamethasone, and CAR T therapy.   Followed by heme/onc for multiple myeloma and thrombocytopenia. He received an Immunoglobulin infusion on 02/14/23 which he does monthly.   Note in May of 2024 reported remission of myeloma.  Past Medical History:  Diagnosis Date   Allergy    Anemia    Arthritis    Asthma    no treatment x 20 years   Depression    Double vision    occurs at times    Duodenal ulcer    GERD (gastroesophageal reflux disease)    Hyperlipidemia    Hypertension    Hypothyroidism    Multiple myeloma 07/04/2011   Thyroid disease    Past Surgical History:  Procedure Laterality Date   BONE MARROW TRANSPLANT  2011   for MM   CARDIOLITE STUDY  11/25/2003   NORMAL   TONSILLECTOMY      Allergies  Allergen Reactions   Atorvastatin Other (See Comments)   Rosuvastatin     Other reaction(s): Liver Disorder   Crestor [Rosuvastatin Calcium]     ADVERSE EFFECTS ON LIVER   Lipitor [Atorvastatin Calcium]    Septra [Sulfamethoxazole-Trimethoprim] Rash    Allergies as of 02/15/2023       Reactions   Atorvastatin Other (See Comments)  Rosuvastatin    Other reaction(s): Liver Disorder   Crestor [rosuvastatin Calcium]    ADVERSE EFFECTS ON LIVER   Lipitor [atorvastatin Calcium]    Septra [sulfamethoxazole-trimethoprim] Rash        Medication List        Accurate as of Feb 15, 2023 11:59 PM. If you have any questions, ask your nurse or doctor.          STOP taking these medications    LORazepam 0.5 MG tablet Commonly known as: ATIVAN Stopped by: Fletcher Anon, NP       TAKE these medications    acetaminophen 650 MG CR tablet Commonly known as: TYLENOL Take 650 mg by mouth every 6 (six) hours as  needed for pain.   calcium carbonate 750 MG chewable tablet Commonly known as: TUMS EX Chew 1 tablet by mouth daily.   calcium-vitamin D 500-200 MG-UNIT tablet Commonly known as: OSCAL WITH D Take 1 tablet by mouth daily.   cetirizine 10 MG tablet Commonly known as: ZYRTEC Take 10 mg by mouth daily.   dapsone 100 MG tablet Take 100 mg by mouth daily.   docusate sodium 100 MG capsule Commonly known as: COLACE Take 100 mg by mouth daily as needed for mild constipation or moderate constipation.   escitalopram 10 MG tablet Commonly known as: LEXAPRO Take 1 tablet (10 mg total) by mouth daily.   levothyroxine 150 MCG tablet Commonly known as: SYNTHROID TAKE 1 TABLET BY MOUTH EVERY MORNING ON AN EMPTY STOMACH   methylphenidate 5 MG tablet Commonly known as: RITALIN Take 0.5 tablets (2.5 mg total) by mouth 2 (two) times daily with breakfast and lunch. At breakfast and lunch   traMADol 50 MG tablet Commonly known as: ULTRAM Take 50 mg by mouth every 6 (six) hours as needed.   triamcinolone ointment 0.1 % Commonly known as: KENALOG Apply to affected area topically 2 (two) times daily.   valACYclovir 500 MG tablet Commonly known as: VALTREX Take 500 mg by mouth 2 (two) times daily.   Vitamin D3 25 MCG (1000 UT) Caps Take 1,000 Units by mouth daily.        Review of Systems  Constitutional:  Negative for activity change, appetite change, chills, diaphoresis, fatigue, fever and unexpected weight change.  Respiratory:  Negative for cough, shortness of breath, wheezing and stridor.   Cardiovascular:  Positive for leg swelling. Negative for chest pain and palpitations.  Gastrointestinal:  Negative for abdominal distention, abdominal pain, constipation and diarrhea.  Genitourinary:  Negative for difficulty urinating and dysuria.  Musculoskeletal:  Positive for gait problem. Negative for arthralgias, back pain, joint swelling and myalgias.  Neurological:  Negative for  dizziness, seizures, syncope, facial asymmetry, speech difficulty, weakness and headaches.  Hematological:  Negative for adenopathy. Does not bruise/bleed easily.  Psychiatric/Behavioral:  Negative for agitation, behavioral problems and confusion.        Memory loss    Immunization History  Administered Date(s) Administered   COVID-19, mRNA, vaccine(Comirnaty)12 years and older 07/05/2022   DTaP / Hep [place] / IPV 05/04/2017   DTaP / HiB / IPV 03/06/2018   Fluad Quad(high Dose 65+) 06/09/2019, 06/25/2020, 07/25/2021, 05/30/2022   HIB (PRP-T) 05/04/2017   Hepatitis [place], ADULT 03/14/2017, 03/06/2018   Influenza, High Dose Seasonal PF 06/16/2018   Influenza,inj,Quad PF,6+ Mos 06/16/2013, 07/03/2014, 06/11/2015, 05/31/2017   Influenza-Unspecified 07/29/2012   PFIZER(Purple Top)SARS-COV-2 Vaccination 10/26/2019, 11/20/2019   Pfizer Covid-19 Vaccine Bivalent Booster 72yrs & up 06/27/2021   Pneumococcal Conjugate-13 10/15/2013,  05/04/2017, 03/06/2018   Pneumococcal Polysaccharide-23 06/16/2013   Respiratory Syncytial Virus Vaccine,Recomb Aduvanted(Arexvy) 05/30/2022   Td 07/03/2014   Pertinent  Health Maintenance Due  Topic Date Due   INFLUENZA VACCINE  04/19/2023   Colonoscopy  09/24/2023      07/26/2022   10:22 AM 08/18/2022    9:00 AM 08/30/2022   11:01 AM 09/06/2022   10:00 AM 02/15/2023    1:29 PM  Fall Risk  Falls in the past year?     1  Was there an injury with Fall?     1  Fall Risk Category Calculator     3  (RETIRED) Patient Fall Risk Level Low fall risk Low fall risk Low fall risk Low fall risk   Fall risk Follow up     Falls evaluation completed   Functional Status Survey:    Vitals:   02/15/23 1408  BP: 119/71  Pulse: 75  Resp: 14  Temp: 97.6 F (36.4 C)  SpO2: 90%  Weight: 168 lb 6.4 oz (76.4 kg)  Height: 5\' 7"  (1.702 m)   Body mass index is 26.38 kg/m. Physical Exam Vitals and nursing note reviewed.  Constitutional:      General: He is not in acute  distress.    Appearance: He is not diaphoretic.  HENT:     Head: Normocephalic and atraumatic.  Neck:     Thyroid: No thyromegaly.     Vascular: No JVD.     Trachea: No tracheal deviation.  Cardiovascular:     Rate and Rhythm: Normal rate and regular rhythm.     Heart sounds: No murmur heard. Pulmonary:     Effort: Pulmonary effort is normal. No respiratory distress.     Breath sounds: Normal breath sounds. No wheezing.  Abdominal:     General: Bowel sounds are normal. There is no distension.     Palpations: Abdomen is soft.     Tenderness: There is no abdominal tenderness.  Musculoskeletal:     Comments: Edema BLE +1  Lymphadenopathy:     Cervical: No cervical adenopathy.  Skin:    General: Skin is warm and dry.  Neurological:     Mental Status: He is alert and oriented to person, place, and time.     Cranial Nerves: No cranial nerve deficit.     Labs reviewed: Recent Labs    01/11/23 2027 01/18/23 0856 02/14/23 0801  NA 137 138 139  K 4.3 3.8 4.0  CL 103 106 104  CO2 26 26 25   GLUCOSE 84 79 111*  BUN 27* 16 17  CREATININE 0.76 0.80 0.91  CALCIUM 9.4 9.6 9.4   Recent Labs    12/20/22 0817 01/18/23 0856 02/14/23 0801  AST 16 19 22   ALT 9 14 18   ALKPHOS 50 67 70  BILITOT 0.5 0.4 0.5  PROT 6.6 6.1* 6.2*  ALBUMIN 4.5 3.9 3.9   Recent Labs    01/11/23 2027 01/18/23 0856 02/01/23 0000 02/14/23 0801  WBC 3.4* 2.8* 2.6 2.9*  NEUTROABS 2.1 1.9 1.60 2.1  HGB 9.2* 9.3* 9.1* 9.2*  HCT 29.2* 28.9* 28* 27.8*  MCV 113.2* 112.0*  --  108.6*  PLT 34* 35* 28* 38*   Lab Results  Component Value Date   TSH 0.02 (L) 08/04/2021   Lab Results  Component Value Date   HGBA1C 5.4 03/27/2012   Lab Results  Component Value Date   CHOL 178 02/13/2023   HDL 42 02/13/2023   LDLCALC 113 02/13/2023  LDLDIRECT 155.8 11/13/2012   TRIG 167 (A) 02/13/2023   CHOLHDL 4 09/06/2017    Significant Diagnostic Results in last 30 days:  No results  found.  Assessment/Plan  1. Falls frequently Fall prec Walker at all times.   2. Acquired hypothyroidism TSH 0.479 3/24 Continue Synthroid Lipid panel reviewed looks ok  3. Memory problem Followed by neurology Going for MRI and neurocognitive testing.   4. Neuropathy due to chemotherapeutic drug (HCC) Denies pain Contributes to falls.   5. Reactive depression (situational) Seems to be adjusting well to skilled care Continue lexapro  6. Peripheral edema Noted to BLE Elevate Consider compression stocking if worsening   7. Thrombocytopenia (HCC) Plt ct 35K 02/01/23 recheck due next week    Labs/tests ordered:  per oncology

## 2023-02-16 ENCOUNTER — Encounter: Payer: Self-pay | Admitting: Adult Health

## 2023-02-22 LAB — MULTIPLE MYELOMA PANEL, SERUM
Albumin SerPl Elph-Mcnc: 3.4 g/dL (ref 2.9–4.4)
Albumin/Glob SerPl: 1.4 (ref 0.7–1.7)
Alpha 1: 0.3 g/dL (ref 0.0–0.4)
Alpha2 Glob SerPl Elph-Mcnc: 0.8 g/dL (ref 0.4–1.0)
B-Globulin SerPl Elph-Mcnc: 1.1 g/dL (ref 0.7–1.3)
Gamma Glob SerPl Elph-Mcnc: 0.4 g/dL (ref 0.4–1.8)
Globulin, Total: 2.6 g/dL (ref 2.2–3.9)
IgA: <5 mg/dL — ABNORMAL LOW (ref 61–437)
IgG (Immunoglobin G), Serum: 572 mg/dL — ABNORMAL LOW (ref 603–1613)
IgM (Immunoglobulin M), Srm: <5 mg/dL — ABNORMAL LOW (ref 15–143)
M Protein SerPl Elph-Mcnc: 0.1 g/dL — ABNORMAL HIGH
Total Protein ELP: 6 g/dL (ref 6.0–8.5)

## 2023-02-27 ENCOUNTER — Encounter: Payer: Self-pay | Admitting: Orthopedic Surgery

## 2023-02-27 ENCOUNTER — Non-Acute Institutional Stay (SKILLED_NURSING_FACILITY): Payer: Medicare Other | Admitting: Orthopedic Surgery

## 2023-02-27 DIAGNOSIS — Z9109 Other allergy status, other than to drugs and biological substances: Secondary | ICD-10-CM | POA: Diagnosis not present

## 2023-02-27 DIAGNOSIS — R0602 Shortness of breath: Secondary | ICD-10-CM

## 2023-02-27 DIAGNOSIS — R413 Other amnesia: Secondary | ICD-10-CM | POA: Diagnosis not present

## 2023-02-27 NOTE — Progress Notes (Unsigned)
Location[facility] Room Number: 142-A Place of Service:  SNF [phone]) Provider:  Hazle Nordmann, NP  PCP: [name], MD  Patient Care Team: [name], MD as PCP - General (Internal Medicine) Shirlean Schlein, MD as Consulting Physician (Neurology) Eddie Candle, MD as Consulting Physician (Internal Medicine) York Spaniel, MD (Inactive) as Consulting Physician (Neurology) Sharlett Iles, DDS as Referring Physician (Oral Surgery) Ladene Artist, MD as Consulting Physician (Oncology) Fletcher Anon, NP as Nurse Practitioner (Nurse Practitioner)  Extended Emergency Contact Information Primary Emergency Contact: Yuille,[name] Address: [address]          [place], [place] [address] [name] Home Phone: [phone] Relation: Spouse  Code Status:  DNR Goals of care: Advanced Directive information    02/27/2023    1:19 PM  Advanced Directives  Does Patient Have a Medical Advance Directive? Yes  Type of Advance Directive Out of facility DNR (pink MOST or yellow form)  Does patient want to make changes to medical advance directive? No - Patient declined     Chief Complaint  Patient presents with   Acute Visit    Shortness of breath.    HPI:  Pt is a 72 y.o. male seen today for an acute visit for    Past Medical History:  Diagnosis Date   Allergy    Anemia    Arthritis    Asthma    no treatment x 20 years   Depression    Double vision    occurs at times    Duodenal ulcer    GERD (gastroesophageal reflux disease)    Hyperlipidemia    Hypertension    Hypothyroidism    Multiple myeloma 07/04/2011   Thyroid disease    Past Surgical History:  Procedure Laterality Date   BONE MARROW TRANSPLANT  2011   for MM   CARDIOLITE STUDY  11/25/2003   NORMAL   TONSILLECTOMY      Allergies  Allergen Reactions   Atorvastatin Other (See Comments)   Rosuvastatin     Other reaction(s):  Liver Disorder   Crestor [Rosuvastatin Calcium]     ADVERSE EFFECTS ON LIVER   Lipitor [Atorvastatin Calcium]    Septra [Sulfamethoxazole-Trimethoprim] Rash    Allergies as of 02/27/2023       Reactions   Atorvastatin Other (See Comments)   Rosuvastatin    Other reaction(s): Liver Disorder   Crestor [rosuvastatin Calcium]    ADVERSE EFFECTS ON LIVER   Lipitor [atorvastatin Calcium]    Septra [sulfamethoxazole-trimethoprim] Rash        Medication List        Accurate as of February 27, 2023  1:19 PM. If you have any questions, ask your nurse or doctor.          acetaminophen 650 MG CR tablet Commonly known as: TYLENOL Take 650 mg by mouth every 6 (six) hours as needed for pain.   calcium carbonate 750 MG chewable tablet Commonly known as: TUMS EX Chew 1 tablet by mouth daily.   calcium-vitamin D 500-200 MG-UNIT tablet Commonly known as: OSCAL WITH D Take 1 tablet by mouth daily.   cetirizine 10 MG tablet Commonly known as: ZYRTEC Take 10 mg by mouth daily.   dapsone 100 MG tablet Take 100 mg by mouth daily.   docusate sodium 100 MG capsule Commonly known as: COLACE Take 100 mg by mouth daily as needed for mild constipation or moderate  constipation.   escitalopram 10 MG tablet Commonly known as: LEXAPRO Take 1 tablet (10 mg total) by mouth daily.   levothyroxine 150 MCG tablet Commonly known as: SYNTHROID TAKE 1 TABLET BY MOUTH EVERY MORNING ON AN EMPTY STOMACH   methylphenidate 5 MG tablet Commonly known as: RITALIN Take 0.5 tablets (2.5 mg total) by mouth 2 (two) times daily with breakfast and lunch. At breakfast and lunch   traMADol 50 MG tablet Commonly known as: ULTRAM Take 50 mg by mouth every 6 (six) hours as needed.   triamcinolone ointment 0.1 % Commonly known as: KENALOG Apply to affected area topically 2 (two) times daily.   valACYclovir 500 MG tablet Commonly known as: VALTREX Take 500 mg by mouth 2 (two) times daily.   Vitamin D3 25  MCG (1000 UT) Caps Take 1,000 Units by mouth daily.        Review of Systems  Immunization History  Administered Date(s) Administered   COVID-19, mRNA, vaccine(Comirnaty)12 years and older 07/05/2022   DTaP / Hep [place] / IPV 05/04/2017   DTaP / HiB / IPV 03/06/2018   Fluad Quad(high Dose 65+) 06/09/2019, 06/25/2020, 07/25/2021, 05/30/2022   HIB (PRP-T) 05/04/2017   Hepatitis [place], ADULT 03/14/2017, 03/06/2018   Influenza, High Dose Seasonal PF 06/16/2018   Influenza,inj,Quad PF,6+ Mos 06/16/2013, 07/03/2014, 06/11/2015, 05/31/2017   Influenza-Unspecified 07/29/2012   PFIZER(Purple Top)SARS-COV-2 Vaccination 10/26/2019, 11/20/2019   Pfizer Covid-19 Vaccine Bivalent Booster 58yrs & up 06/27/2021   Pneumococcal Conjugate-13 10/15/2013, 05/04/2017, 03/06/2018   Pneumococcal Polysaccharide-23 06/16/2013   Respiratory Syncytial Virus Vaccine,Recomb Aduvanted(Arexvy) 05/30/2022   Td 07/03/2014   Pertinent  Health Maintenance Due  Topic Date Due   INFLUENZA VACCINE  04/19/2023   Colonoscopy  09/24/2023      07/26/2022   10:22 AM 08/18/2022    9:00 AM 08/30/2022   11:01 AM 09/06/2022   10:00 AM 02/15/2023    1:29 PM  Fall Risk  Falls in the past year?     1  Was there an injury with Fall?     1  Fall Risk Category Calculator     3  (RETIRED) Patient Fall Risk Level Low fall risk Low fall risk Low fall risk Low fall risk   Fall risk Follow up     Falls evaluation completed   Functional Status Survey:    Vitals:   02/27/23 1315  BP: 119/71  Pulse: 75  Resp: 14  Temp: (!) 96.9 F (36.1 C)  SpO2: 96%  Weight: 168 lb 4.8 oz (76.3 kg)  Height: 5\' 7"  (1.702 m)   Body mass index is 26.36 kg/m. Physical Exam  Labs reviewed: Recent Labs    01/11/23 2027 01/18/23 0856 02/14/23 0801  NA 137 138 139  K 4.3 3.8 4.0  CL 103 106 104  CO2 26 26 25   GLUCOSE 84 79 111*  BUN 27* 16 17  CREATININE 0.76 0.80 0.91  CALCIUM 9.4 9.6 9.4   Recent Labs    12/20/22 0817  01/18/23 0856 02/14/23 0801  AST 16 19 22   ALT 9 14 18   ALKPHOS 50 67 70  BILITOT 0.5 0.4 0.5  PROT 6.6 6.1* 6.2*  ALBUMIN 4.5 3.9 3.9   Recent Labs    01/11/23 2027 01/18/23 0856 02/01/23 0000 02/14/23 0801  WBC 3.4* 2.8* 2.6 2.9*  NEUTROABS 2.1 1.9 1.60 2.1  HGB 9.2* 9.3* 9.1* 9.2*  HCT 29.2* 28.9* 28* 27.8*  MCV 113.2* 112.0*  --  108.6*  PLT 34* 35*  28* 38*   Lab Results  Component Value Date   TSH 0.02 (L) 08/04/2021   Lab Results  Component Value Date   HGBA1C 5.4 03/27/2012   Lab Results  Component Value Date   CHOL 178 02/13/2023   HDL 42 02/13/2023   LDLCALC 113 02/13/2023   LDLDIRECT 155.8 11/13/2012   TRIG 167 (A) 02/13/2023   CHOLHDL 4 09/06/2017    Significant Diagnostic Results in last 30 days:  No results found.  Assessment/Plan There are no diagnoses linked to this encounter.   Family/ staff Communication:   Labs/tests ordered:

## 2023-02-28 LAB — CBC AND DIFFERENTIAL
HCT: 25 — AB (ref 41–53)
Hemoglobin: 8.2 — AB (ref 13.5–17.5)
Platelets: 22 10*3/uL — AB (ref 150–400)
WBC: 2.3

## 2023-02-28 LAB — CBC: RBC: 2.21 — AB (ref 3.87–5.11)

## 2023-02-28 MED ORDER — LEVOCETIRIZINE DIHYDROCHLORIDE 5 MG PO TABS
5.0000 mg | ORAL_TABLET | Freq: Every evening | ORAL | 0 refills | Status: DC
Start: 2023-02-28 — End: 2023-03-15

## 2023-03-01 ENCOUNTER — Telehealth: Payer: Self-pay | Admitting: Physician Assistant

## 2023-03-01 ENCOUNTER — Telehealth: Payer: Self-pay | Admitting: *Deleted

## 2023-03-01 DIAGNOSIS — R413 Other amnesia: Secondary | ICD-10-CM

## 2023-03-01 DIAGNOSIS — C9000 Multiple myeloma not having achieved remission: Secondary | ICD-10-CM

## 2023-03-01 NOTE — Telephone Encounter (Signed)
Faxed to [facility] and [facility]

## 2023-03-01 NOTE — Telephone Encounter (Signed)
New message   [facility] calling MRI order needs to be changed to say with or without contrast per new regulation from [facility].   Fax # [phone]

## 2023-03-01 NOTE — Telephone Encounter (Signed)
Call to nurse, Deanna Artis at [facility] that he will need to come to cancer center on 6/19 at 11:00 for labs to be drawn here due to need for blood bank sample. She will arrange transport. Faxed appointment to facility as well.

## 2023-03-01 NOTE — Telephone Encounter (Signed)
Ordered will fax

## 2023-03-02 ENCOUNTER — Telehealth: Payer: Self-pay | Admitting: *Deleted

## 2023-03-02 NOTE — Telephone Encounter (Signed)
Wife called to inquire about appointment on 6/19 and what infusion he will receive. Called back and left VM that he is only having lab and the reason it can't be done at facility is needing a blood bank draw in case he needs transfusion.

## 2023-03-07 ENCOUNTER — Telehealth: Payer: Self-pay | Admitting: *Deleted

## 2023-03-07 ENCOUNTER — Inpatient Hospital Stay: Payer: Medicare Other | Attending: Oncology

## 2023-03-07 DIAGNOSIS — D61818 Other pancytopenia: Secondary | ICD-10-CM | POA: Diagnosis not present

## 2023-03-07 DIAGNOSIS — C9000 Multiple myeloma not having achieved remission: Secondary | ICD-10-CM | POA: Insufficient documentation

## 2023-03-07 LAB — CBC WITH DIFFERENTIAL (CANCER CENTER ONLY)
Abs Immature Granulocytes: 0.07 10*3/uL (ref 0.00–0.07)
Basophils Absolute: 0 10*3/uL (ref 0.0–0.1)
Basophils Relative: 1 %
Eosinophils Absolute: 0.1 10*3/uL (ref 0.0–0.5)
Eosinophils Relative: 2 %
HCT: 29.3 % — ABNORMAL LOW (ref 39.0–52.0)
Hemoglobin: 9.5 g/dL — ABNORMAL LOW (ref 13.0–17.0)
Immature Granulocytes: 2 %
Lymphocytes Relative: 16 %
Lymphs Abs: 0.6 10*3/uL — ABNORMAL LOW (ref 0.7–4.0)
MCH: 36.7 pg — ABNORMAL HIGH (ref 26.0–34.0)
MCHC: 32.4 g/dL (ref 30.0–36.0)
MCV: 113.1 fL — ABNORMAL HIGH (ref 80.0–100.0)
Monocytes Absolute: 0.7 10*3/uL (ref 0.1–1.0)
Monocytes Relative: 19 %
Neutro Abs: 2.3 10*3/uL (ref 1.7–7.7)
Neutrophils Relative %: 60 %
Platelet Count: 36 10*3/uL — ABNORMAL LOW (ref 150–400)
RBC: 2.59 MIL/uL — ABNORMAL LOW (ref 4.22–5.81)
RDW: 17.9 % — ABNORMAL HIGH (ref 11.5–15.5)
WBC Count: 3.8 10*3/uL — ABNORMAL LOW (ref 4.0–10.5)
nRBC: 0 % (ref 0.0–0.2)

## 2023-03-07 LAB — SAMPLE TO BLOOD BANK

## 2023-03-07 NOTE — Telephone Encounter (Signed)
MD reviewed labs today:stable to improved. Notified Mrs. Kammer and faxed copy to [facility].

## 2023-03-08 ENCOUNTER — Ambulatory Visit
Admission: RE | Admit: 2023-03-08 | Discharge: 2023-03-08 | Disposition: A | Discharge status: Home / Self Care | Payer: Medicare Other | Source: Ambulatory Visit | Attending: Physician Assistant | Admitting: Physician Assistant

## 2023-03-08 MED ORDER — GADOPICLENOL 0.5 MMOL/ML IV SOLN
7.5000 mL | Freq: Once | INTRAVENOUS | Status: AC | PRN
  Administered 2023-03-08: 7.5 mL via INTRAVENOUS

## 2023-03-09 NOTE — Progress Notes (Signed)
MRI brain without acute findings, but there is progression of the vascular disease. Once again, the little cerebellar strokes from the past are there, but no new strokes. There is mild atrophy, some age related changes ans some chronic sequelae from his myeloma,  Thanks   6. Heterogeneous marrow signal and enhancement within the calvarium, skull base and visualized cervical spine. Although nonspecific, findings may reflect sequelae of multiple myeloma given the provided history.

## 2023-03-12 ENCOUNTER — Other Ambulatory Visit: Payer: Self-pay | Admitting: Adult Health

## 2023-03-12 MED ORDER — METHYLPHENIDATE HCL 5 MG PO TABS: 2.5000 mg | ORAL_TABLET | Freq: Two times a day (BID) | ORAL | 0 refills | Status: DC

## 2023-03-14 ENCOUNTER — Inpatient Hospital Stay (HOSPITAL_BASED_OUTPATIENT_CLINIC_OR_DEPARTMENT_OTHER): Payer: Medicare Other | Admitting: Oncology

## 2023-03-14 ENCOUNTER — Inpatient Hospital Stay: Payer: Medicare Other

## 2023-03-14 ENCOUNTER — Encounter: Payer: Self-pay | Admitting: Oncology

## 2023-03-14 VITALS — BP 113/68 | HR 100 | Temp 98.1°F | Resp 18 | Ht 67.0 in | Wt 176.0 lb

## 2023-03-14 VITALS — BP 130/55 | HR 62 | Temp 98.2°F | Resp 18

## 2023-03-14 DIAGNOSIS — C9000 Multiple myeloma not having achieved remission: Secondary | ICD-10-CM

## 2023-03-14 LAB — CMP (CANCER CENTER ONLY)
ALT: 21 U/L (ref 0–44)
AST: 25 U/L (ref 15–41)
Albumin: 4.3 g/dL (ref 3.5–5.0)
Alkaline Phosphatase: 88 U/L (ref 38–126)
Anion gap: 13 (ref 5–15)
BUN: 24 mg/dL — ABNORMAL HIGH (ref 8–23)
CO2: 21 mmol/L — ABNORMAL LOW (ref 22–32)
Calcium: 9.6 mg/dL (ref 8.9–10.3)
Chloride: 106 mmol/L (ref 98–111)
Creatinine: 1 mg/dL (ref 0.61–1.24)
GFR, Estimated: >60 mL/min (ref >60)
Glucose, Bld: 77 mg/dL (ref 70–99)
Potassium: 4.4 mmol/L (ref 3.5–5.1)
Sodium: 140 mmol/L (ref 135–145)
Total Bilirubin: 0.5 mg/dL (ref 0.3–1.2)
Total Protein: 7 g/dL (ref 6.5–8.1)

## 2023-03-14 LAB — CBC WITH DIFFERENTIAL (CANCER CENTER ONLY)
Abs Immature Granulocytes: 0.02 10*3/uL (ref 0.00–0.07)
Basophils Absolute: 0 10*3/uL (ref 0.0–0.1)
Basophils Relative: 1 %
Eosinophils Absolute: 0.1 10*3/uL (ref 0.0–0.5)
Eosinophils Relative: 3 %
HCT: 30.8 % — ABNORMAL LOW (ref 39.0–52.0)
Hemoglobin: 9.8 g/dL — ABNORMAL LOW (ref 13.0–17.0)
Immature Granulocytes: 1 %
Lymphocytes Relative: 17 %
Lymphs Abs: 0.7 10*3/uL (ref 0.7–4.0)
MCH: 36.6 pg — ABNORMAL HIGH (ref 26.0–34.0)
MCHC: 31.8 g/dL (ref 30.0–36.0)
MCV: 114.9 fL — ABNORMAL HIGH (ref 80.0–100.0)
Monocytes Absolute: 0.7 10*3/uL (ref 0.1–1.0)
Monocytes Relative: 17 %
Neutro Abs: 2.5 10*3/uL (ref 1.7–7.7)
Neutrophils Relative %: 61 %
Platelet Count: 44 10*3/uL — ABNORMAL LOW (ref 150–400)
RBC: 2.68 MIL/uL — ABNORMAL LOW (ref 4.22–5.81)
RDW: 17.7 % — ABNORMAL HIGH (ref 11.5–15.5)
WBC Count: 4.1 10*3/uL (ref 4.0–10.5)
nRBC: 0 % (ref 0.0–0.2)

## 2023-03-14 MED ORDER — IMMUNE GLOBULIN (HUMAN) 20 GM/200ML IV SOLN
400.0000 mg/kg | Freq: Once | INTRAVENOUS | Status: AC
  Administered 2023-03-14: 30 g via INTRAVENOUS
  Filled 2023-03-14: qty 300

## 2023-03-14 MED ORDER — DEXTROSE 5 % IV SOLN: INTRAVENOUS | Status: DC

## 2023-03-14 MED ORDER — DIPHENHYDRAMINE HCL 25 MG PO CAPS
25.0000 mg | ORAL_CAPSULE | Freq: Once | ORAL | Status: AC
  Administered 2023-03-14: 25 mg via ORAL
  Filled 2023-03-14: qty 1

## 2023-03-14 MED ORDER — ACETAMINOPHEN 325 MG PO TABS
650.0000 mg | ORAL_TABLET | Freq: Once | ORAL | Status: AC
  Administered 2023-03-14: 650 mg via ORAL
  Filled 2023-03-14: qty 2

## 2023-03-14 NOTE — Patient Instructions (Signed)

## 2023-03-14 NOTE — Progress Notes (Signed)
Patient seen by Dr. Sherrill today  Vitals are within treatment parameters.  Labs reviewed by Dr. Sherrill CBC diff reviewed and within treatment parameters, CMP pending.  Per physician team, patient is ready for treatment and there are NO modifications to the treatment plan.  

## 2023-03-14 NOTE — Progress Notes (Signed)
[facility] OFFICE PROGRESS NOTE   Diagnosis: Multiple myeloma  INTERVAL HISTORY:   Andre [name] returns as scheduled.  No new complaint.  He is participating in physical therapy and weight training.  He remains in the skilled unit at [facility].  No fever or bleeding.  He is scheduled to see Dr. Dorothea Ogle tomorrow.  Objective:  Vital signs in last 24 hours:  Blood pressure 113/68, pulse 100, temperature 98.1 F (36.7 C), temperature source Oral, resp. rate 18, height 5\' 7"  (1.702 m), weight 176 lb (79.8 kg), SpO2 98 %.    HEENT: No thrush or ulcers Resp: Lungs clear bilaterally Cardio: Regular rate and rhythm GI: No hepatosplenomegaly Vascular: Trace edema to lower legs bilaterally   Lab Results:  Lab Results  Component Value Date   WBC 4.1 03/14/2023   HGB 9.8 (L) 03/14/2023   HCT 30.8 (L) 03/14/2023   MCV 114.9 (H) 03/14/2023   PLT 44 (L) 03/14/2023   NEUTROABS 2.5 03/14/2023    CMP  Lab Results  Component Value Date   NA 140 03/14/2023   K 4.4 03/14/2023   CL 106 03/14/2023   CO2 21 (L) 03/14/2023   GLUCOSE 77 03/14/2023   BUN 24 (H) 03/14/2023   CREATININE 1.00 03/14/2023   CALCIUM 9.6 03/14/2023   PROT 7.0 03/14/2023   ALBUMIN 4.3 03/14/2023   AST 25 03/14/2023   ALT 21 03/14/2023   ALKPHOS 88 03/14/2023   BILITOT 0.5 03/14/2023   GFRNONAA >60 03/14/2023   GFRAA >60 06/15/2020    Medications: I have reviewed the patient's current medications.   Assessment/Plan: Multiple myeloma- IgG lambda  bortezomib (subcutaneously), lenalidomide, and dexamethasone, with repeat bone marrow biopsy May of 2012 showing 10% plasmacytosis   (2) high-dose chemotherapy with BCNU and melphalan at [facility], followed by stem cell rescue July of 2012   (3) on zoledronic acid started December of 2012, initially monthly, currently given every 3 months, most recent dose  12/07/2015   (4) low-dose lenalidomide resumed April 2013, interrupted several times.   Resumed again on 02/19/2013, ata dose of 5 mg daily, 21 days on and 7 days off, later further reduced to 7 days on, 7 days off   (5) CNS symptoms and abnormal brain MRI extensively evaluated by neurology with no definitive diagnosis established   (6) rising M spike noted June 2014 but did not meet criteria for carfilzomib study   (7) on lenalidomide 25 mg daily, 14 days on, 7 days off, starting 04/18/2013, interrupted December 2014 because of rash;             (a) resumed January 2015 at 10 mg/ day at 21 days on/ 7 days off             (b) starting 08/18/2014 decreased to 10 mg/ day 14 days on, 7 days off because of cytopenias             (c) as of February 2016 was on 5 mg daily 7 days on 7 days off             (d) lenalidomide discontinued December 2016 with evidence of disease progression   (8) transient global amnesia 05/29/2015, resolved without intervention   (9) starting PVD 10/25/2015 w ASA 325 thromboprophylaxis, valacyclovir prophylaxis, last dose 12/17/2015             (a) pomalidomide 4 mg/d days 1-14             (b) bortezomib sQ  days 2,5,9,12 of each 21 day cycle             (c) dexamethasome 20 mg two days a week             (d) dexamethasone bortezomib and pomalidomide discontinued late December 2018 with poor tolerance   (10) metapneumovirus pneumonia April 2017             (a) completing course of steroids and week of bactrim mid April 2017   (11) status post second autologous transplant at [facility] 02/04/2016(preparatory regimen melphalan 200 mg/m)             (a) received twelve-month vaccinations 03/14/2017 (DPT, Haemophilus, Pneumovax 13, polio)             (b) 14 months injections 05/04/2016 include DTaP, Hib conjugate, HepB energex B 20 mcg/ml, Prevnar 13             (c) 24 month vaccines due at [facility] June 2019   (12) maintenance therapy started November 2017, consisting of             (a) bortezomib 1.3 mg/M2 every 14 days, first dose 07/27/2016             (b)  pomalidomide 1 mg days 1-21 Q28 days, started 07/19/2016             (c) zolendronate monthly started 07/27/2016 (previously Q 3 months) however patient unable to tolerate, and changed back to q3 months in April, 2018             (d) Bortezomib changed to monthly as of June 2018 because of tolerance issues, however discontinued after 09/14/2017 dose because of a rise in his M spike             (e) pomalidomide held after 10/18/2017 in preparation for possible study at [facility] (venetoclax)--never resumed             (f) with numbers actually improved off treatment, resumed every 2-week bortezomib 12/25/2017             (g) changed to every 3-week bortezomib as of 03/17/2019             (h) briefly on weekly treatments times 22 October 2020 due to increase in M spike             (I) maintenance therapy discontinued with evidence of progression   (13) bortezomib/daratumumab/dexamethasone started 12/20/2020.             (a) Decadron dose dropped to 10 mg day of and day following treatment starting May 2022             (b) day 8 bortezomib omitted beginning with the June cycle per patient preference             (C) treatment changed to monthly daratumumab/Velcade/Decadron beginning 06/27/2021-patient decision (14) weekly Velcade/Cytoxan beginning 11/14/2021 (15) changed to Cytoxan/carfilzomib/Decadron 12/19/2021-treatment placed on hold 05/15/2022, carfilzomib 05/24/2022 and 05/31/2022 16.  Leukocyte a pheresis procedure at [facility] 06/15/2022 17.  Treatment resumed with Cytoxan/carfilzomib/Decadron 06/28/2022, last given 09/06/2022 18.  CAR-T 09/26/2022 with Carvykti 19.  CRS and ICANS following CAR-T therapy, status post a Decadron taper completed 10/08/2022 20.  Presentation to the emergency room 10/30/2022 with failure to thrive and a fall  21.  Pancytopenia secondary to multiple myeloma and CAR-T therapy-improved 22.  Bone marrow biopsy 11/13/2022-mildly hypocellular bone marrow with relative erythroid  hyperplasia and decreased megakaryocytes; no plasma cells identified  by differential count or CD138 immunohistochemical stain; flow cytometry negative for a clonal plasma cell population.  23.  Cognitive impairment predating CAR-T therapy and worsened following CAR-T therapy      Disposition: Andre [name] is in clinical remission from myeloma.  He continues valacyclovir and dapsone prophylaxis.  Pancytopenia has partially improved.  He continues monthly IVIG.  He is scheduled for follow-up with Dr. Dorothea Ogle tomorrow.  Andre. [name] will return for an office visit and IVIG in 1 month.  We are available to see him sooner as needed.  Thornton Papas, MD  03/14/2023  8:45 AM

## 2023-03-15 ENCOUNTER — Non-Acute Institutional Stay (SKILLED_NURSING_FACILITY): Payer: Medicare Other | Admitting: Adult Health

## 2023-03-15 ENCOUNTER — Encounter: Payer: Self-pay | Admitting: Adult Health

## 2023-03-15 DIAGNOSIS — Z9109 Other allergy status, other than to drugs and biological substances: Secondary | ICD-10-CM

## 2023-03-15 DIAGNOSIS — G620 Drug-induced polyneuropathy: Secondary | ICD-10-CM

## 2023-03-15 DIAGNOSIS — T451X5A Adverse effect of antineoplastic and immunosuppressive drugs, initial encounter: Secondary | ICD-10-CM

## 2023-03-15 DIAGNOSIS — C9000 Multiple myeloma not having achieved remission: Secondary | ICD-10-CM

## 2023-03-15 DIAGNOSIS — I679 Cerebrovascular disease, unspecified: Secondary | ICD-10-CM

## 2023-03-15 DIAGNOSIS — E039 Hypothyroidism, unspecified: Secondary | ICD-10-CM

## 2023-03-15 DIAGNOSIS — D61818 Other pancytopenia: Secondary | ICD-10-CM | POA: Diagnosis not present

## 2023-03-15 NOTE — Progress Notes (Signed)
Location[facility] Room Number: 142-A Place of Service:  SNF ([phone]) Provider:  Fletcher Anon, NP    Patient Care Team: [name], MD as PCP - General (Internal Medicine) Shirlean Schlein, MD as Consulting Physician (Neurology) Eddie Candle, MD as Consulting Physician (Internal Medicine) York Spaniel, MD (Inactive) as Consulting Physician (Neurology) Sharlett Iles, DDS as Referring Physician (Oral Surgery) Ladene Artist, MD as Consulting Physician (Oncology) Fletcher Anon, NP as Nurse Practitioner (Nurse Practitioner)  Extended Emergency Contact Information Primary Emergency Contact: [name],[name] Address: [address]          [place], [place] [address] [name] Home Phone: [phone] Relation: Spouse  Code Status:  DNR Goals of care: Advanced Directive information    03/15/2023    8:53 AM  Advanced Directives  Does Patient Have a Medical Advance Directive? Yes  Type of Advance Directive Out of facility DNR (pink MOST or yellow form)  Does patient want to make changes to medical advance directive? No - Patient declined  Would patient like information on creating a medical advance directive? No - Patient declined  Pre-existing out of facility DNR order (yellow form or pink MOST form) Yellow form placed in chart (order not valid for inpatient use)     Chief Complaint  Patient presents with   Medical Management of Chronic Issues    Routine Visit   Quality Metric Gaps    Needs to discuss Annual Medicare Wellness Visit, Pneumonia and Covid Vaccine     HPI:  Pt is a 72 y.o. male seen today for medical management of chronic diseases.   He was admitted to [facility] skilled care in April due to weakness and cognitive decline from treatment of Multiple Myeloma  There are no acute concerns for this visit. He was seen on 6/11 for nasal congestion and sob. CXR ordered which was normal.  Zyrtec started. Symptoms resolved.   Seen by neurology in May, no memory meds recommended. MRI done 03/08/23 which showed Advanced chronic small vessel ischemic changes within the cerebral white matter, progressed from the prior brain MRI of 05/29/2015. 4. Redemonstrated chronic infarcts within the bilateral cerebellar hemispheres. 5. Mild generalized cerebral atrophy.  Diagnosed with multiple myeloma in 2012. Has received chemo, dexamethasone, and CAR T therapy.   Followed by heme/onc for multiple myeloma and thrombocytopenia. He received an Immunoglobulin infusion on 02/14/23 which he does monthly.   Note in May of 2024 reported remission of myeloma.   Last plt 6/19 36k. No acute bleeding episodes.   ON synthroid 150 mcg TSH 0.463 12/07/22   Past Medical History:  Diagnosis Date   Allergy    Anemia    Arthritis    Asthma    no treatment x 20 years   Depression    Double vision    occurs at times    Duodenal ulcer    GERD (gastroesophageal reflux disease)    Hyperlipidemia    Hypertension    Hypothyroidism    Multiple myeloma 07/04/2011   Thyroid disease    Past Surgical History:  Procedure Laterality Date   BONE MARROW TRANSPLANT  2011   for MM   CARDIOLITE STUDY  11/25/2003   NORMAL   TONSILLECTOMY      Allergies  Allergen Reactions   Atorvastatin Other (See Comments)   Rosuvastatin     Other reaction(s): Liver Disorder   Crestor [Rosuvastatin Calcium]     ADVERSE EFFECTS ON LIVER  Lipitor [Atorvastatin Calcium]    Septra [Sulfamethoxazole-Trimethoprim] Rash    Outpatient Encounter Medications as of 03/15/2023  Medication Sig   acetaminophen (TYLENOL) 650 MG CR tablet Take 650 mg by mouth every 6 (six) hours as needed for pain.   calcium carbonate (TUMS EX) 750 MG chewable tablet Chew 1 tablet by mouth daily.   calcium-vitamin D (OSCAL WITH D) 500-200 MG-UNIT tablet Take 1 tablet by mouth daily.   cetirizine (ZYRTEC) 10 MG chewable tablet Chew 10 mg by  mouth daily.   Cholecalciferol (VITAMIN D3) 25 MCG (1000 UT) capsule Take 1,000 Units by mouth daily.   dapsone 100 MG tablet Take 100 mg by mouth daily.   docusate sodium (COLACE) 100 MG capsule Take 100 mg by mouth daily as needed for mild constipation or moderate constipation.   escitalopram (LEXAPRO) 10 MG tablet Take 1 tablet (10 mg total) by mouth daily.   levothyroxine (SYNTHROID) 150 MCG tablet TAKE 1 TABLET BY MOUTH EVERY MORNING ON AN EMPTY STOMACH   methylphenidate (RITALIN) 5 MG tablet Take 0.5 tablets (2.5 mg total) by mouth 2 (two) times daily with breakfast and lunch. At breakfast and lunch   traMADol (ULTRAM) 50 MG tablet Take 50 mg by mouth every 6 (six) hours as needed.   triamcinolone ointment (KENALOG) 0.1 % Apply to affected area topically 2 (two) times daily.   valACYclovir (VALTREX) 500 MG tablet Take 500 mg by mouth 2 (two) times daily.   [DISCONTINUED] levocetirizine (XYZAL) 5 MG tablet Take 1 tablet (5 mg total) by mouth every evening. (Patient not taking: Reported on 03/14/2023)   [DISCONTINUED] prochlorperazine (COMPAZINE) 10 MG tablet Take 1 tablet (10 mg total) by mouth every 6 (six) hours as needed (Nausea or vomiting).   No facility-administered encounter medications on file as of 03/15/2023.    Review of Systems  Constitutional:  Negative for activity change, appetite change, chills, diaphoresis, fatigue, fever and unexpected weight change.  Respiratory:  Negative for cough, shortness of breath, wheezing and stridor.   Cardiovascular:  Positive for leg swelling. Negative for chest pain and palpitations.  Gastrointestinal:  Negative for abdominal distention, abdominal pain, constipation and diarrhea.  Genitourinary:  Negative for difficulty urinating and dysuria.  Musculoskeletal:  Positive for gait problem. Negative for arthralgias, back pain, joint swelling and myalgias.  Neurological:  Negative for dizziness, seizures, syncope, facial asymmetry, speech  difficulty, weakness and headaches.  Hematological:  Negative for adenopathy. Does not bruise/bleed easily.  Psychiatric/Behavioral:  Positive for confusion. Negative for agitation and behavioral problems.     Immunization History  Administered Date(s) Administered   COVID-19, mRNA, vaccine(Comirnaty)12 years and older 07/05/2022   DTaP / Hep B / IPV 05/04/2017   DTaP / HiB / IPV 03/06/2018   Fluad Quad(high Dose 65+) 06/09/2019, 06/25/2020, 07/25/2021, 05/30/2022   HIB (PRP-T) 05/04/2017   Hepatitis B, ADULT 03/14/2017, 03/06/2018   Influenza, High Dose Seasonal PF 06/16/2018   Influenza,inj,Quad PF,6+ Mos 06/16/2013, 07/03/2014, 06/11/2015, 05/31/2017   Influenza-Unspecified 07/29/2012   PFIZER(Purple Top)SARS-COV-2 Vaccination 10/26/2019, 11/20/2019   Pfizer Covid-19 Vaccine Bivalent Booster 38yrs & up 06/27/2021   Pneumococcal Conjugate-13 10/15/2013, 05/04/2017, 03/06/2018   Pneumococcal Polysaccharide-23 06/16/2013   Respiratory Syncytial Virus Vaccine,Recomb Aduvanted(Arexvy) 05/30/2022   Td 07/03/2014   Pertinent  Health Maintenance Due  Topic Date Due   INFLUENZA VACCINE  04/19/2023   Colonoscopy  09/24/2023      07/26/2022   10:22 AM 08/18/2022    9:00 AM 08/30/2022   11:01 AM 09/06/2022  10:00 AM 02/15/2023    1:29 PM  Fall Risk  Falls in the past year?     1  Was there an injury with Fall?     1  Fall Risk Category Calculator     3  (RETIRED) Patient Fall Risk Level Low fall risk Low fall risk Low fall risk Low fall risk   Fall risk Follow up     Falls evaluation completed   Functional Status Survey:    Vitals:   03/15/23 0855  BP: 109/66  Pulse: 76  Resp: 16  Temp: (!) 97.5 F (36.4 C)  SpO2: 90%  Weight: 168 lb 4.8 oz (76.3 kg)  Height: 5\' 7"  (1.702 m)   Body mass index is 26.36 kg/m. Physical Exam Vitals and nursing note reviewed.  Constitutional:      Appearance: Normal appearance.  Cardiovascular:     Rate and Rhythm: Normal rate and  regular rhythm.  Pulmonary:     Effort: Pulmonary effort is normal.     Breath sounds: Normal breath sounds.  Abdominal:     General: Abdomen is flat. Bowel sounds are normal.     Palpations: Abdomen is soft.  Musculoskeletal:     Cervical back: No rigidity or tenderness.     Comments: BLE edema +1  Lymphadenopathy:     Cervical: No cervical adenopathy.  Skin:    General: Skin is warm and dry.  Neurological:     General: No focal deficit present.     Mental Status: He is alert.     Labs reviewed: Recent Labs    01/18/23 0856 02/14/23 0801 03/14/23 0755  NA 138 139 140  K 3.8 4.0 4.4  CL 106 104 106  CO2 26 25 21*  GLUCOSE 79 111* 77  BUN 16 17 24*  CREATININE 0.80 0.91 1.00  CALCIUM 9.6 9.4 9.6   Recent Labs    01/18/23 0856 02/14/23 0801 03/14/23 0755  AST 19 22 25   ALT 14 18 21   ALKPHOS 67 70 88  BILITOT 0.4 0.5 0.5  PROT 6.1* 6.2* 7.0  ALBUMIN 3.9 3.9 4.3   Recent Labs    02/14/23 0801 02/28/23 0000 03/07/23 1055 03/14/23 0755  WBC 2.9* 2.3 3.8* 4.1  NEUTROABS 2.1  --  2.3 2.5  HGB 9.2* 8.2* 9.5* 9.8*  HCT 27.8* 25* 29.3* 30.8*  MCV 108.6*  --  113.1* 114.9*  PLT 38* 22* 36* 44*   Lab Results  Component Value Date   TSH 0.02 (L) 08/04/2021   Lab Results  Component Value Date   HGBA1C 5.4 03/27/2012   Lab Results  Component Value Date   CHOL 178 02/13/2023   HDL 42 02/13/2023   LDLCALC 113 02/13/2023   LDLDIRECT 155.8 11/13/2012   TRIG 167 (A) 02/13/2023   CHOLHDL 4 09/06/2017    Significant Diagnostic Results in last 30 days:  MR BRAIN W WO CONTRAST  Result Date: 03/08/2023 CLINICAL DATA:  Provided history: Memory impairment. Additional history provided: Confusion, difficulty walking. History of multiple myeloma. EXAM: MRI HEAD WITHOUT AND WITH CONTRAST TECHNIQUE: Multiplanar, multiecho pulse sequences of the brain and surrounding structures were obtained without and with intravenous contrast. CONTRAST:  7.5 mL Vueway intravenous  contrast. COMPARISON:  Head CT 01/11/2023.  Brain MRI 05/30/2015. FINDINGS: Brain: Mild generalized cerebral atrophy. Multifocal T2 FLAIR hyperintense signal abnormality within the cerebral white matter, nonspecific but compatible with advanced chronic small vessel ischemic disease. Punctate chronic microhemorrhage within the inferior left parietal lobe.  1.7 cm chronic infarct within the left cerebellar hemisphere. Tiny chronic infarct within the right cerebellar hemisphere. There is no acute infarct. No evidence of an intracranial mass. No extra-axial fluid collection. No midline shift. The axial 3D T1-weighted post-contrast sequence is moderate to severely motion degraded. However, the coronal T1-weighted post-contrast sequence is of good quality. No pathologic intracranial enhancement is identified. Vascular: Maintained flow voids within the proximal large arterial vessels. Skull and upper cervical spine: Heterogeneous marrow signal and enhancement within the calvarium, skull base and visualized upper cervical spine. Sinuses/Orbits: No mass or acute finding within the imaged orbits. Mild mucosal thickening within the bilateral ethmoid sinuses. IMPRESSION: 1. Motion degraded post-contrast imaging. 2. Within this limitation, there is no evidence of an acute intracranial abnormality. 3. Advanced chronic small vessel ischemic changes within the cerebral white matter, progressed from the prior brain MRI of 05/29/2015. 4. Redemonstrated chronic infarcts within the bilateral cerebellar hemispheres. 5. Mild generalized cerebral atrophy. 6. Heterogeneous marrow signal and enhancement within the calvarium, skull base and visualized cervical spine. Although nonspecific, findings may reflect sequelae of multiple myeloma given the provided history. Electronically Signed   By: Jackey Loge D.O.   On: 03/08/2023 13:32    Assessment/Plan  1. Environmental allergies He would like to continue the zyrtec to complete 30 day  course.   2. Acquired hypothyroidism TSH is low on synthroid per labs done by oncology.  Waiting on response from them  3. Neuropathy due to chemotherapeutic drug (HCC) Contributes to fall risk Falls have improved at [facility]  4. Pancytopenia (HCC) Followed by hematology On infusions  5. Multiple myeloma not having achieved remission (HCC) Followed by onc  6. Cerebrovascular disease Noted on MRI Followed by neurology  LDL 113 BP controlled.     Labs/tests ordered:  Labs are done at the cancer center.

## 2023-03-20 ENCOUNTER — Encounter: Payer: Self-pay | Admitting: Oncology

## 2023-03-23 ENCOUNTER — Encounter: Payer: Self-pay | Admitting: *Deleted

## 2023-03-23 NOTE — Progress Notes (Signed)
Per Dr. Truett Perna: Needs lab/OV/IVIG on 6/24. Scheduling message sent w/request to call SNF and wife

## 2023-03-26 ENCOUNTER — Encounter: Payer: Self-pay | Admitting: Oncology

## 2023-03-27 ENCOUNTER — Telehealth: Payer: Self-pay | Admitting: Oncology

## 2023-03-27 ENCOUNTER — Telehealth: Payer: Self-pay | Admitting: *Deleted

## 2023-03-27 NOTE — Telephone Encounter (Signed)
Mrs. Tarry called to inquire if our office has heard anything from Dr. Marissa Calamity recently? Per [facility]where, next appointment is 06/21/23. Noted an office visit on 03/15/23, but no notes are seen.

## 2023-03-27 NOTE — Telephone Encounter (Addendum)
Call to Dr. Marissa Calamity' office and left message for nurse, Waynetta Sandy to call w/update on his status and current treatment plan. Dr. Truett Perna has not heard from him either. Waynetta Sandy, RN returned call. Dr. Marissa Calamity has not dictated his note of 6/27 yet. This far out, labs are adequate for monthly with his IVIG.   Left VM for Mrs. Pitzen that note is not available yet. Will call her when it gets dictated.

## 2023-04-05 ENCOUNTER — Other Ambulatory Visit: Payer: Self-pay | Admitting: Adult Health

## 2023-04-05 MED ORDER — METHYLPHENIDATE HCL 5 MG PO TABS: 2.5000 mg | ORAL_TABLET | Freq: Every day | ORAL | 0 refills | Status: DC

## 2023-04-05 NOTE — Progress Notes (Signed)
Nurse reports resident/family would like to consolidate meds. Taking valtrex bid and would like to change to daily. (which is what he was on prior to being at [facility].) He was also on Ritalin daily rather than twice daily. Will try dose reduction of Ritalin and monitor pt.

## 2023-04-09 ENCOUNTER — Encounter: Payer: Self-pay | Admitting: Nurse Practitioner

## 2023-04-09 ENCOUNTER — Inpatient Hospital Stay: Payer: Medicare Other | Attending: Oncology

## 2023-04-09 ENCOUNTER — Other Ambulatory Visit: Payer: Self-pay | Admitting: *Deleted

## 2023-04-09 ENCOUNTER — Inpatient Hospital Stay (HOSPITAL_BASED_OUTPATIENT_CLINIC_OR_DEPARTMENT_OTHER): Payer: Medicare Other | Admitting: Nurse Practitioner

## 2023-04-09 ENCOUNTER — Encounter: Payer: Self-pay | Admitting: *Deleted

## 2023-04-09 VITALS — BP 106/63 | HR 96 | Temp 97.9°F | Resp 18 | Ht 67.0 in | Wt 176.0 lb

## 2023-04-09 DIAGNOSIS — R195 Other fecal abnormalities: Secondary | ICD-10-CM | POA: Insufficient documentation

## 2023-04-09 DIAGNOSIS — C9000 Multiple myeloma not having achieved remission: Secondary | ICD-10-CM

## 2023-04-09 LAB — CMP (CANCER CENTER ONLY)
ALT: 19 U/L (ref 0–44)
AST: 23 U/L (ref 15–41)
Albumin: 4.1 g/dL (ref 3.5–5.0)
Alkaline Phosphatase: 82 U/L (ref 38–126)
Anion gap: 9 (ref 5–15)
BUN: 22 mg/dL (ref 8–23)
CO2: 25 mmol/L (ref 22–32)
Calcium: 9.9 mg/dL (ref 8.9–10.3)
Chloride: 102 mmol/L (ref 98–111)
Creatinine: 1.08 mg/dL (ref 0.61–1.24)
GFR, Estimated: >60 mL/min (ref >60)
Glucose, Bld: 90 mg/dL (ref 70–99)
Potassium: 4.4 mmol/L (ref 3.5–5.1)
Sodium: 136 mmol/L (ref 135–145)
Total Bilirubin: 0.5 mg/dL (ref 0.3–1.2)
Total Protein: 6.5 g/dL (ref 6.5–8.1)

## 2023-04-09 LAB — CBC WITH DIFFERENTIAL (CANCER CENTER ONLY)
Abs Immature Granulocytes: 0.04 10*3/uL (ref 0.00–0.07)
Basophils Absolute: 0 10*3/uL (ref 0.0–0.1)
Basophils Relative: 0 %
Eosinophils Absolute: 0 10*3/uL (ref 0.0–0.5)
Eosinophils Relative: 1 %
HCT: 25.5 % — ABNORMAL LOW (ref 39.0–52.0)
Hemoglobin: 8.1 g/dL — ABNORMAL LOW (ref 13.0–17.0)
Immature Granulocytes: 1 %
Lymphocytes Relative: 10 %
Lymphs Abs: 0.4 10*3/uL — ABNORMAL LOW (ref 0.7–4.0)
MCH: 36 pg — ABNORMAL HIGH (ref 26.0–34.0)
MCHC: 31.8 g/dL (ref 30.0–36.0)
MCV: 113.3 fL — ABNORMAL HIGH (ref 80.0–100.0)
Monocytes Absolute: 0.6 10*3/uL (ref 0.1–1.0)
Monocytes Relative: 17 %
Neutro Abs: 2.6 10*3/uL (ref 1.7–7.7)
Neutrophils Relative %: 71 %
Platelet Count: 41 10*3/uL — ABNORMAL LOW (ref 150–400)
RBC: 2.25 MIL/uL — ABNORMAL LOW (ref 4.22–5.81)
RDW: 17.2 % — ABNORMAL HIGH (ref 11.5–15.5)
WBC Count: 3.7 10*3/uL — ABNORMAL LOW (ref 4.0–10.5)
nRBC: 0 % (ref 0.0–0.2)

## 2023-04-09 MED ORDER — FOLIC ACID 1 MG PO TABS: 1.0000 mg | ORAL_TABLET | Freq: Every day | ORAL | Status: DC

## 2023-04-09 MED ORDER — VITAMIN B-12 1000 MCG PO TABS: 1000.0000 ug | ORAL_TABLET | Freq: Every day | ORAL | Status: DC

## 2023-04-09 NOTE — Progress Notes (Signed)
Per Beth w/Dr. Marissa Calamity: Start B12 1000 mcg daily + folic acid 1 mg daily. Requested recent med list be faxed to t them. He agrees for BMBX to be local with the following tests: MRD flo Regular cytogenetics MDS FISH panel Myeloid mutation panel Also request PCP follow thyroid functions and prescribe his levothyroxine.

## 2023-04-09 NOTE — Progress Notes (Signed)
[facility] OFFICE PROGRESS NOTE   Diagnosis: Multiple myeloma  INTERVAL HISTORY:   Andre [name] returns as scheduled.  He completed an IVIG infusion 03/14/2023.  He is feeling stronger.  He ambulates with a walker.  No interim infection.  He denies fever.  He has occasional sweats.  He describes his appetite as "great".  He denies pain.  No bleeding.  Objective:  Vital signs in last 24 hours:  Blood pressure 106/63, pulse 96, temperature 97.9 F (36.6 C), temperature source Oral, resp. rate 18, height 5\' 7"  (1.702 m), weight 176 lb (79.8 kg), SpO2 98%.    HEENT: No thrush or ulcers. Resp: Lungs clear bilaterally. Cardio: Regular rate and rhythm. GI: No hepatosplenomegaly. Vascular: Trace edema lower leg bilaterally.   Lab Results:  Lab Results  Component Value Date   WBC 3.7 (L) 04/09/2023   HGB 8.1 (L) 04/09/2023   HCT 25.5 (L) 04/09/2023   MCV 113.3 (H) 04/09/2023   PLT 41 (L) 04/09/2023   NEUTROABS 2.6 04/09/2023    Imaging:  No results found.  Medications: I have reviewed the patient's current medications.  Assessment/Plan: Multiple myeloma- IgG lambda  bortezomib (subcutaneously), lenalidomide, and dexamethasone, with repeat bone marrow biopsy May of 2012 showing 10% plasmacytosis   (2) high-dose chemotherapy with BCNU and melphalan at [facility], followed by stem cell rescue July of 2012   (3) on zoledronic acid started December of 2012, initially monthly, currently given every 3 months, most recent dose  12/07/2015   (4) low-dose lenalidomide resumed April 2013, interrupted several times.  Resumed again on 02/19/2013, ata dose of 5 mg daily, 21 days on and 7 days off, later further reduced to 7 days on, 7 days off   (5) CNS symptoms and abnormal brain MRI extensively evaluated by neurology with no definitive diagnosis established   (6) rising M spike noted June 2014 but did not meet criteria for carfilzomib study   (7) on lenalidomide 25 mg  daily, 14 days on, 7 days off, starting 04/18/2013, interrupted December 2014 because of rash;             (a) resumed January 2015 at 10 mg/ day at 21 days on/ 7 days off             (b) starting 08/18/2014 decreased to 10 mg/ day 14 days on, 7 days off because of cytopenias             (c) as of February 2016 was on 5 mg daily 7 days on 7 days off             (d) lenalidomide discontinued December 2016 with evidence of disease progression   (8) transient global amnesia 05/29/2015, resolved without intervention   (9) starting PVD 10/25/2015 w ASA 325 thromboprophylaxis, valacyclovir prophylaxis, last dose 12/17/2015             (a) pomalidomide 4 mg/d days 1-14             (b) bortezomib sQ days 2,5,9,12 of each 21 day cycle             (c) dexamethasome 20 mg two days a week             (d) dexamethasone bortezomib and pomalidomide discontinued late December 2018 with poor tolerance   (10) metapneumovirus pneumonia April 2017             (a) completing course of steroids and week of bactrim mid April  2017   (11) status post second autologous transplant at [facility] 02/04/2016(preparatory regimen melphalan 200 mg/m)             (a) received twelve-month vaccinations 03/14/2017 (DPT, Haemophilus, Pneumovax 13, polio)             (b) 14 months injections 05/04/2016 include DTaP, Hib conjugate, HepB energex B 20 mcg/ml, Prevnar 13             (c) 24 month vaccines due at [facility] June 2019   (12) maintenance therapy started November 2017, consisting of             (a) bortezomib 1.3 mg/M2 every 14 days, first dose 07/27/2016             (b) pomalidomide 1 mg days 1-21 Q28 days, started 07/19/2016             (c) zolendronate monthly started 07/27/2016 (previously Q 3 months) however patient unable to tolerate, and changed back to q3 months in April, 2018             (d) Bortezomib changed to monthly as of June 2018 because of tolerance issues, however discontinued after 09/14/2017 dose because of a  rise in his M spike             (e) pomalidomide held after 10/18/2017 in preparation for possible study at [facility] (venetoclax)--never resumed             (f) with numbers actually improved off treatment, resumed every 2-week bortezomib 12/25/2017             (g) changed to every 3-week bortezomib as of 03/17/2019             (h) briefly on weekly treatments times 22 October 2020 due to increase in M spike             (I) maintenance therapy discontinued with evidence of progression   (13) bortezomib/daratumumab/dexamethasone started 12/20/2020.             (a) Decadron dose dropped to 10 mg day of and day following treatment starting May 2022             (b) day 8 bortezomib omitted beginning with the June cycle per patient preference             (C) treatment changed to monthly daratumumab/Velcade/Decadron beginning 06/27/2021-patient decision (14) weekly Velcade/Cytoxan beginning 11/14/2021 (15) changed to Cytoxan/carfilzomib/Decadron 12/19/2021-treatment placed on hold 05/15/2022, carfilzomib 05/24/2022 and 05/31/2022 16.  Leukocyte a pheresis procedure at [facility] 06/15/2022 17.  Treatment resumed with Cytoxan/carfilzomib/Decadron 06/28/2022, last given 09/06/2022 18.  CAR-T 09/26/2022 with Carvykti 19.  CRS and ICANS following CAR-T therapy, status post a Decadron taper completed 10/08/2022 20.  Presentation to the emergency room 10/30/2022 with failure to thrive and a fall  21.  Pancytopenia secondary to multiple myeloma and CAR-T therapy-improved 22.  Bone marrow biopsy 11/13/2022-mildly hypocellular bone marrow with relative erythroid hyperplasia and decreased megakaryocytes; no plasma cells identified by differential count or CD138 immunohistochemical stain; flow cytometry negative for a clonal plasma cell population.  23.  Cognitive impairment predating CAR-T therapy and worsened following CAR-T therapy    Disposition: [name] appears unchanged.  He remains in clinical remission from  myeloma.  He has persistent pancytopenia.  Per Dr. Marissa Calamity office note dated 03/15/2023 bone marrow biopsy is recommended.  Mr. Dymek wife would like to schedule this toward the end of August.  He has persistent  pancytopenia.  Hemoglobin is lower.  Labs from 03/15/2023 show ferritin at the lower end of the normal range and decreased iron saturation.  We will repeat iron studies here.  He will return for a follow-up CBC in 1 week with Red cell transfusion support as indicated.  At present he appears asymptomatic.  He will return for IVIG as scheduled 04/10/2023.  He will return for follow-up in 1 month.  We are available to see him sooner if needed.    Lonna Cobb ANP/GNP-BC   04/09/2023  2:11 PM

## 2023-04-10 ENCOUNTER — Inpatient Hospital Stay: Payer: Medicare Other

## 2023-04-10 VITALS — BP 118/58 | HR 61 | Temp 98.2°F | Resp 18

## 2023-04-10 DIAGNOSIS — C9000 Multiple myeloma not having achieved remission: Secondary | ICD-10-CM

## 2023-04-10 LAB — FERRITIN: Ferritin: 18 ng/mL — ABNORMAL LOW (ref 24–336)

## 2023-04-10 LAB — IRON AND TIBC
Iron: 110 ug/dL (ref 45–182)
Saturation Ratios: 19 % (ref 17.9–39.5)
TIBC: 589 ug/dL — ABNORMAL HIGH (ref 250–450)
UIBC: 479 ug/dL

## 2023-04-10 MED ORDER — ACETAMINOPHEN 325 MG PO TABS
650.0000 mg | ORAL_TABLET | Freq: Once | ORAL | Status: AC
  Administered 2023-04-10: 650 mg via ORAL
  Filled 2023-04-10: qty 2

## 2023-04-10 MED ORDER — DIPHENHYDRAMINE HCL 25 MG PO CAPS
25.0000 mg | ORAL_CAPSULE | Freq: Once | ORAL | Status: AC
  Administered 2023-04-10: 25 mg via ORAL
  Filled 2023-04-10: qty 1

## 2023-04-10 MED ORDER — DEXTROSE 5 % IV SOLN: Freq: Once | INTRAVENOUS | Status: AC

## 2023-04-10 MED ORDER — IMMUNE GLOBULIN (HUMAN) 20 GM/200ML IV SOLN
400.0000 mg/kg | Freq: Once | INTRAVENOUS | Status: AC
  Administered 2023-04-10: 30 g via INTRAVENOUS
  Filled 2023-04-10: qty 300

## 2023-04-10 NOTE — Patient Instructions (Signed)

## 2023-04-12 ENCOUNTER — Telehealth: Payer: Self-pay

## 2023-04-12 ENCOUNTER — Other Ambulatory Visit: Payer: Self-pay

## 2023-04-12 DIAGNOSIS — C9000 Multiple myeloma not having achieved remission: Secondary | ICD-10-CM

## 2023-04-12 NOTE — Telephone Encounter (Signed)
-----   Message from Lonna Cobb sent at 04/11/2023  4:22 PM EDT ----- Please let him know labs show evidence of iron deficiency. He should begin ferrous sulfate 325 mg twice daily.  He will need a urinalysis at next office visit and should complete a set of stool cards prior to that appointment.

## 2023-04-12 NOTE — Telephone Encounter (Signed)
I have sent the information to the patient's nurse and informed the patient that their ferritin level is 18. It is recommended that they begin taking ferrous sulfate 325 mg twice daily.

## 2023-04-16 ENCOUNTER — Encounter: Payer: Self-pay | Admitting: Internal Medicine

## 2023-04-16 ENCOUNTER — Non-Acute Institutional Stay (SKILLED_NURSING_FACILITY): Payer: Medicare Other | Admitting: Internal Medicine

## 2023-04-16 DIAGNOSIS — R413 Other amnesia: Secondary | ICD-10-CM

## 2023-04-16 DIAGNOSIS — D61818 Other pancytopenia: Secondary | ICD-10-CM | POA: Diagnosis not present

## 2023-04-16 DIAGNOSIS — C9000 Multiple myeloma not having achieved remission: Secondary | ICD-10-CM | POA: Diagnosis not present

## 2023-04-16 DIAGNOSIS — T451X5A Adverse effect of antineoplastic and immunosuppressive drugs, initial encounter: Secondary | ICD-10-CM

## 2023-04-16 DIAGNOSIS — E039 Hypothyroidism, unspecified: Secondary | ICD-10-CM

## 2023-04-16 DIAGNOSIS — F329 Major depressive disorder, single episode, unspecified: Secondary | ICD-10-CM

## 2023-04-16 DIAGNOSIS — G620 Drug-induced polyneuropathy: Secondary | ICD-10-CM | POA: Diagnosis not present

## 2023-04-16 DIAGNOSIS — D696 Thrombocytopenia, unspecified: Secondary | ICD-10-CM

## 2023-04-16 DIAGNOSIS — I679 Cerebrovascular disease, unspecified: Secondary | ICD-10-CM

## 2023-04-16 NOTE — Progress Notes (Unsigned)
Location:  [facility] Room Number: 142A Place of Service:  SNF [phone]) Provider:  [name], MD   [name], MD  Patient Care Team: [name], MD as PCP - General (Internal Medicine) Shirlean Schlein, MD as Consulting Physician (Neurology) Eddie Candle, MD as Consulting Physician (Internal Medicine) York Spaniel, MD (Inactive) as Consulting Physician (Neurology) Sharlett Iles, DDS as Referring Physician (Oral Surgery) Ladene Artist, MD as Consulting Physician (Oncology) Fletcher Anon, NP as Nurse Practitioner (Nurse Practitioner)  Extended Emergency Contact Information Primary Emergency Contact: Ng,[name] Address: [address]          [place], [place] [address] [name] Home Phone: [phone] Relation: Spouse  Code Status:  DNR Goals of care: Advanced Directive information    04/20/2023    8:21 AM  Advanced Directives  Does Patient Have a Medical Advance Directive? Yes  Type of Advance Directive Out of facility DNR (pink MOST or yellow form)  Does patient want to make changes to medical advance directive? No - Patient declined  Would patient like information on creating a medical advance directive? No - Patient declined  Pre-existing out of facility DNR order (yellow form or pink MOST form) Yellow form placed in chart (order not valid for inpatient use)     Chief Complaint  Patient presents with   Medical Management of Chronic Issues    Patient is being seen for a routine visit.   Immunizations    Patient is due for pneumonia and covid vaccine     HPI:  Pt is a 72 y.o. [name] seen today for medical management of chronic diseases.   Lives in SNF in [place] Doing well Plan to move to [place]  [facility] with his walker and no recent falls  Patient has h/o Multiple Myeloma diagnosed in 2012 Recently Post CAR T therapy per DR Marissa Calamity on 01/24  In Clinical Remission On IVIG  [facility]  due to CART and Chronic Microvascular changes On Ritalin Stable no New complains Weight stable   Past Medical History:  Diagnosis Date   Allergy    Anemia    Arthritis    Asthma    no treatment x 20 years   Depression    Double vision    occurs at times    Duodenal ulcer    GERD (gastroesophageal reflux disease)    Hyperlipidemia    Hypertension    Hypothyroidism    Multiple myeloma 07/04/2011   Thyroid disease    Past Surgical History:  Procedure Laterality Date   BONE MARROW TRANSPLANT  2011   for MM   CARDIOLITE STUDY  11/25/2003   NORMAL   TONSILLECTOMY      Allergies  Allergen Reactions   Atorvastatin Other (See Comments)   Rosuvastatin     Other reaction(s): Liver Disorder   Crestor [Rosuvastatin Calcium]     ADVERSE EFFECTS ON LIVER   Lipitor [Atorvastatin Calcium]    Septra [Sulfamethoxazole-Trimethoprim] Rash    Outpatient Encounter Medications as of 04/16/2023  Medication Sig   acetaminophen (TYLENOL) 650 MG CR tablet Take 650 mg by mouth every 6 (six) hours as needed for pain.   calcium carbonate (TUMS EX) 750 MG chewable tablet Chew 1 tablet by mouth daily.   calcium-vitamin D (OSCAL WITH D) 500-200 MG-UNIT tablet Take 1 tablet by mouth daily.   Cholecalciferol (VITAMIN D3) 25 MCG (1000 UT) capsule Take 1,000 Units by mouth daily.  cyanocobalamin (VITAMIN B12) 1000 MCG tablet Take 1 tablet (1,000 mcg total) by mouth daily.   dapsone 100 MG tablet Take 100 mg by mouth daily.   docusate sodium (COLACE) 100 MG capsule Take 100 mg by mouth daily as needed for mild constipation or moderate constipation.   escitalopram (LEXAPRO) 10 MG tablet Take 1 tablet (10 mg total) by mouth daily.   ferrous sulfate 325 (65 FE) MG EC tablet Take 325 mg by mouth 2 (two) times daily.   folic acid (FOLVITE) 1 MG tablet Take 1 tablet (1 mg total) by mouth daily.   levothyroxine (SYNTHROID) 125 MCG tablet Take 125 mcg by mouth daily.   methylphenidate (RITALIN) 5 MG tablet  Take 0.5 tablets (2.5 mg total) by mouth daily. Each morning   triamcinolone ointment (KENALOG) 0.1 % Apply to affected area topically 2 (two) times daily.   valACYclovir (VALTREX) 500 MG tablet Take 500 mg by mouth daily.   [DISCONTINUED] cetirizine (ZYRTEC) 10 MG chewable tablet Chew 10 mg by mouth daily. (Patient not taking: Reported on 04/16/2023)   [DISCONTINUED] levothyroxine (SYNTHROID) 150 MCG tablet TAKE 1 TABLET BY MOUTH EVERY MORNING ON AN EMPTY STOMACH   [DISCONTINUED] prochlorperazine (COMPAZINE) 10 MG tablet Take 1 tablet (10 mg total) by mouth every 6 (six) hours as needed (Nausea or vomiting).   [DISCONTINUED] traMADol (ULTRAM) 50 MG tablet Take 50 mg by mouth every 6 (six) hours as needed. (Patient not taking: Reported on 04/16/2023)   No facility-administered encounter medications on file as of 04/16/2023.    Review of Systems  Constitutional:  Negative for activity change, appetite change and unexpected weight change.  HENT: Negative.    Respiratory:  Negative for cough and shortness of breath.   Cardiovascular:  Negative for leg swelling.  Gastrointestinal:  Negative for constipation.  Genitourinary:  Negative for frequency.  Musculoskeletal:  Positive for gait problem. Negative for arthralgias and myalgias.  Skin: Negative.  Negative for rash.  Neurological:  Negative for dizziness and weakness.  Psychiatric/Behavioral:  Positive for confusion. Negative for sleep disturbance.   All other systems reviewed and are negative.   Immunization History  Administered Date(s) Administered   COVID-19, mRNA, vaccine(Comirnaty)12 years and older 07/05/2022   DTaP / Hep B / IPV 05/04/2017   DTaP / HiB / IPV 03/06/2018   Fluad Quad(high Dose 65+) 06/09/2019, 06/25/2020, 07/25/2021, 05/30/2022   HIB (PRP-T) 05/04/2017   Hepatitis B, ADULT 03/14/2017, 03/06/2018   IPV 01/28/1998   Influenza, High Dose Seasonal PF 06/16/2018   Influenza,inj,Quad PF,6+ Mos 06/16/2013, 07/03/2014,  06/11/2015, 05/31/2017   Influenza-Unspecified 07/29/2012   PFIZER(Purple Top)SARS-COV-2 Vaccination 10/26/2019, 11/20/2019, 07/22/2020   Pfizer Covid-19 Vaccine Bivalent Booster 10yrs & up 06/27/2021   Pneumococcal Conjugate-13 10/15/2013, 05/04/2017, 03/06/2018   Pneumococcal Polysaccharide-23 06/16/2013   Respiratory Syncytial Virus Vaccine,Recomb Aduvanted(Arexvy) 05/30/2022   Td 02/10/1998, 07/03/2014   Pertinent  Health Maintenance Due  Topic Date Due   INFLUENZA VACCINE  04/19/2023   Colonoscopy  09/24/2023      07/26/2022   10:22 AM 08/18/2022    9:00 AM 08/30/2022   11:01 AM 09/06/2022   10:00 AM 02/15/2023    1:29 PM  Fall Risk  Falls in the past year?     1  Was there an injury with Fall?     1  Fall Risk Category Calculator     3  (RETIRED) Patient Fall Risk Level Low fall risk Low fall risk Low fall risk Low fall risk   Fall  risk Follow up     Falls evaluation completed   Functional Status Survey:    Vitals:   04/16/23 1438  BP: 118/67  Pulse: 75  Resp: 19  Temp: (!) 97.3 F (36.3 C)  TempSrc: Temporal  SpO2: 90%  Weight: 168 lb 4.8 oz (76.3 kg)  Height: 5\' 7"  (1.702 m)   Body mass index is 26.36 kg/m. Physical Exam Vitals reviewed.  Constitutional:      Appearance: Normal appearance.  HENT:     Head: Normocephalic.     Nose: Nose normal.     Mouth/Throat:     Mouth: Mucous membranes are moist.     Pharynx: Oropharynx is clear.  Eyes:     Pupils: Pupils are equal, round, and reactive to light.  Cardiovascular:     Rate and Rhythm: Normal rate and regular rhythm.     Pulses: Normal pulses.     Heart sounds: No murmur heard. Pulmonary:     Effort: Pulmonary effort is normal. No respiratory distress.     Breath sounds: Normal breath sounds. No rales.  Abdominal:     General: Abdomen is flat. Bowel sounds are normal.     Palpations: Abdomen is soft.  Musculoskeletal:        General: Swelling present.     Cervical back: Neck supple.  Skin:     General: Skin is warm.  Neurological:     General: No focal deficit present.     Mental Status: He is alert and oriented to person, place, and time.  Psychiatric:        Mood and Affect: Mood normal.        Thought Content: Thought content normal.     Labs reviewed: Recent Labs    02/14/23 0801 03/14/23 0755 04/09/23 1259  NA 139 140 136  K 4.0 4.4 4.4  CL 104 106 102  CO2 25 21* 25  GLUCOSE 111* 77 90  BUN 17 24* 22  CREATININE 0.91 1.00 1.08  CALCIUM 9.4 9.6 9.9   Recent Labs    02/14/23 0801 03/14/23 0755 04/09/23 1259  AST 22 25 23   ALT 18 21 19   ALKPHOS 70 Andre 82  BILITOT 0.5 0.5 0.5  PROT 6.2* 7.0 6.5  ALBUMIN 3.9 4.3 4.1   Recent Labs    03/14/23 0755 04/09/23 1259 04/17/23 1353  WBC 4.1 3.7* 3.7*  NEUTROABS 2.5 2.6 2.7  HGB 9.8* 8.1* 8.2*  HCT 30.8* 25.5* 25.5*  MCV 114.9* 113.3* 113.8*  PLT 44* 41* 31*   Lab Results  Component Value Date   TSH 0.02 (L) 08/04/2021   Lab Results  Component Value Date   HGBA1C 5.4 03/27/2012   Lab Results  Component Value Date   CHOL 178 02/13/2023   HDL 42 02/13/2023   LDLCALC 113 02/13/2023   LDLDIRECT 155.8 11/13/2012   TRIG 167 (A) 02/13/2023   CHOLHDL 4 09/06/2017    Significant Diagnostic Results in last 30 days:  CT Head Wo Contrast  Result Date: 04/20/2023 CLINICAL DATA:  Head and neck trauma, fall. EXAM: CT HEAD WITHOUT CONTRAST CT CERVICAL SPINE WITHOUT CONTRAST TECHNIQUE: Multidetector CT imaging of the head and cervical spine was performed following the standard protocol without intravenous contrast. Multiplanar CT image reconstructions of the cervical spine were also generated. RADIATION DOSE REDUCTION: This exam was performed according to the departmental dose-optimization program which includes automated exposure control, adjustment of the mA and/or kV according to patient size and/or use of iterative  reconstruction technique. COMPARISON:  01/11/2023. FINDINGS: CT HEAD FINDINGS Brain: No acute  intracranial hemorrhage, midline shift or mass effect. No extra-axial fluid collection. Periventricular white matter hypodensities are present bilaterally. No hydrocephalus. 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 Vascular: No hyperdense vessel or unexpected calcification. Skull: No acute fracture. Multiple scattered lucencies are present in the calvarium which are unchanged from the prior exam. Sinuses/Orbits: No acute finding. Other: None. CT CERVICAL SPINE FINDINGS Alignment: Minimal anterolisthesis at C4-C5. Skull base and vertebrae: No acute fracture. No primary bone lesion or focal pathologic process. Soft tissues and spinal canal: No prevertebral fluid or swelling. No visible canal hematoma. Disc levels: Mild intervertebral disc space narrowing and uncovertebral osteophyte formation, most pronounced at C5-C7. Mild facet arthropathy bilaterally. Upper chest: No acute abnormality. Other: Carotid artery calcification. IMPRESSION: 1. Stable head CT with no acute intracranial process. 2. Multilevel degenerative changes in the cervical spine without evidence of acute fracture. Electronically Signed   By: Thornell Sartorius M.D.   On: 04/20/2023 00:36   CT Cervical Spine Wo Contrast  Result Date: 04/20/2023 CLINICAL DATA:  Head and neck trauma, fall. EXAM: CT HEAD WITHOUT CONTRAST CT CERVICAL SPINE WITHOUT CONTRAST TECHNIQUE: Multidetector CT imaging of the head and cervical spine was performed following the standard protocol without intravenous contrast. Multiplanar CT image reconstructions of the cervical spine were also generated. RADIATION DOSE REDUCTION: This exam was performed according to the departmental dose-optimization program which includes automated exposure control, adjustment of the mA and/or kV according to patient size and/or use of iterative reconstruction technique. COMPARISON:  01/11/2023. FINDINGS: CT HEAD FINDINGS Brain: No acute intracranial hemorrhage, midline shift or mass  effect. No extra-axial fluid collection. Periventricular white matter hypodensities are present bilaterally. No hydrocephalus. 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 Vascular: No hyperdense vessel or unexpected calcification. Skull: No acute fracture. Multiple scattered lucencies are present in the calvarium which are unchanged from the prior exam. Sinuses/Orbits: No acute finding. Other: None. CT CERVICAL SPINE FINDINGS Alignment: Minimal anterolisthesis at C4-C5. Skull base and vertebrae: No acute fracture. No primary bone lesion or focal pathologic process. Soft tissues and spinal canal: No prevertebral fluid or swelling. No visible canal hematoma. Disc levels: Mild intervertebral disc space narrowing and uncovertebral osteophyte formation, most pronounced at C5-C7. Mild facet arthropathy bilaterally. Upper chest: No acute abnormality. Other: Carotid artery calcification. IMPRESSION: 1. Stable head CT with no acute intracranial process. 2. Multilevel degenerative changes in the cervical spine without evidence of acute fracture. Electronically Signed   By: Thornell Sartorius M.D.   On: 04/20/2023 00:36    Assessment/Plan 1. Acquired hypothyroidism TSH was low in Dr Fredderick Erb office Synthroid has been decreased to 125 mcg Repeat TSH   2. Neuropathy due to chemotherapeutic drug [facility]) Doing well with his walker  3. Pancytopenia (HCC) Follows with Oncology On Iron and Vit B12 4. Multiple myeloma with Clinical Remission On IVIG infusions per Oncology  5 Memory problem Combination of Vascular disease and CART Doing well Plan to go to [place]  6 . Reactive depression (situational) Lexapro      Family/ staff Communication: ***  Labs/tests ordered:  ***

## 2023-04-17 ENCOUNTER — Inpatient Hospital Stay: Payer: Medicare Other

## 2023-04-17 DIAGNOSIS — C9000 Multiple myeloma not having achieved remission: Secondary | ICD-10-CM | POA: Diagnosis not present

## 2023-04-17 LAB — SAMPLE TO BLOOD BANK

## 2023-04-17 LAB — CBC WITH DIFFERENTIAL (CANCER CENTER ONLY)
Abs Immature Granulocytes: 0.05 10*3/uL (ref 0.00–0.07)
Basophils Absolute: 0 10*3/uL (ref 0.0–0.1)
Basophils Relative: 0 %
Eosinophils Absolute: 0.1 10*3/uL (ref 0.0–0.5)
Eosinophils Relative: 2 %
HCT: 25.5 % — ABNORMAL LOW (ref 39.0–52.0)
Hemoglobin: 8.2 g/dL — ABNORMAL LOW (ref 13.0–17.0)
Immature Granulocytes: 1 %
Lymphocytes Relative: 9 %
Lymphs Abs: 0.3 10*3/uL — ABNORMAL LOW (ref 0.7–4.0)
MCH: 36.6 pg — ABNORMAL HIGH (ref 26.0–34.0)
MCHC: 32.2 g/dL (ref 30.0–36.0)
MCV: 113.8 fL — ABNORMAL HIGH (ref 80.0–100.0)
Monocytes Absolute: 0.6 10*3/uL (ref 0.1–1.0)
Monocytes Relative: 16 %
Neutro Abs: 2.7 10*3/uL (ref 1.7–7.7)
Neutrophils Relative %: 72 %
Platelet Count: 31 10*3/uL — ABNORMAL LOW (ref 150–400)
RBC Morphology: NONE SEEN
RBC: 2.24 MIL/uL — ABNORMAL LOW (ref 4.22–5.81)
RDW: 18.3 % — ABNORMAL HIGH (ref 11.5–15.5)
WBC Count: 3.7 10*3/uL — ABNORMAL LOW (ref 4.0–10.5)
nRBC: 0.8 % — ABNORMAL HIGH (ref 0.0–0.2)

## 2023-04-18 ENCOUNTER — Encounter: Payer: Self-pay | Admitting: Orthopedic Surgery

## 2023-04-18 ENCOUNTER — Other Ambulatory Visit (HOSPITAL_BASED_OUTPATIENT_CLINIC_OR_DEPARTMENT_OTHER): Payer: Self-pay

## 2023-04-18 ENCOUNTER — Other Ambulatory Visit: Payer: Self-pay

## 2023-04-18 DIAGNOSIS — D696 Thrombocytopenia, unspecified: Secondary | ICD-10-CM

## 2023-04-18 DIAGNOSIS — C9000 Multiple myeloma not having achieved remission: Secondary | ICD-10-CM

## 2023-04-18 LAB — OCCULT BLOOD X 1 CARD TO LAB, STOOL
Fecal Occult Bld: POSITIVE — AB
Fecal Occult Bld: POSITIVE — AB
Fecal Occult Bld: POSITIVE — AB

## 2023-04-18 LAB — URINALYSIS, COMPLETE (UACMP) WITH MICROSCOPIC
Bacteria, UA: NONE SEEN
Bilirubin Urine: NEGATIVE
Glucose, UA: NEGATIVE mg/dL
Hgb urine dipstick: NEGATIVE
Ketones, ur: NEGATIVE mg/dL
Leukocytes,Ua: NEGATIVE
Nitrite: NEGATIVE
Protein, ur: NEGATIVE mg/dL
Specific Gravity, Urine: 1.021 (ref 1.005–1.030)
pH: 5.5 (ref 5.0–8.0)

## 2023-04-18 NOTE — Progress Notes (Deleted)
Location:  [facility] Room Number: 142/A Place of Service:  SNF [id number]) Provider:  Octavia Heir, NP   [name], MD  Patient Care Team: [name], MD as PCP - General (Internal Medicine) Shirlean Schlein, MD as Consulting Physician (Neurology) Eddie Candle, MD as Consulting Physician (Internal Medicine) York Spaniel, MD (Inactive) as Consulting Physician (Neurology) Sharlett Iles, DDS as Referring Physician (Oral Surgery) Ladene Artist, MD as Consulting Physician (Oncology) Fletcher Anon, NP as Nurse Practitioner (Nurse Practitioner)  Extended Emergency Contact Information Primary Emergency Contact: [name],[name] Address: [address]          [place], [place] [address] [name] Home Phone: [phone] Relation: Spouse  Code Status:  DNR Goals of care: Advanced Directive information    04/20/2023    8:21 AM  Advanced Directives  Does Patient Have a Medical Advance Directive? Yes  Type of Advance Directive Out of facility DNR (pink MOST or yellow form)  Does patient want to make changes to medical advance directive? No - Patient declined  Would patient like information on creating a medical advance directive? No - Patient declined  Pre-existing out of facility DNR order (yellow form or pink MOST form) Yellow form placed in chart (order not valid for inpatient use)     Chief Complaint  Patient presents with  . Acute Visit    Low platelets  . Error    HPI:  Pt is a 71 y.o. male seen today for acute visit due to low platelet count.        Past Medical History:  Diagnosis Date  . Allergy   . Anemia   . Arthritis   . Asthma    no treatment x 20 years  . Depression   . Double vision    occurs at times   . Duodenal ulcer   . GERD (gastroesophageal reflux disease)   . Hyperlipidemia   . Hypertension   . Hypothyroidism   . Multiple myeloma 07/04/2011  . Thyroid disease     Past Surgical History:  Procedure Laterality Date  . BONE MARROW TRANSPLANT  2011   for MM  . CARDIOLITE STUDY  11/25/2003   NORMAL  . TONSILLECTOMY      Allergies  Allergen Reactions  . Atorvastatin Other (See Comments)  . Rosuvastatin     Other reaction(s): Liver Disorder  . Crestor [Rosuvastatin Calcium]     ADVERSE EFFECTS ON LIVER  . Lipitor [Atorvastatin Calcium]   . Septra [Sulfamethoxazole-Trimethoprim] Rash    Outpatient Encounter Medications as of 04/18/2023  Medication Sig  . acetaminophen (TYLENOL) 650 MG CR tablet Take 650 mg by mouth every 6 (six) hours as needed for pain.  . calcium carbonate (TUMS EX) 750 MG chewable tablet Chew 1 tablet by mouth daily.  . calcium-vitamin D (OSCAL WITH D) 500-200 MG-UNIT tablet Take 1 tablet by mouth daily.  . Cholecalciferol (VITAMIN D3) 25 MCG (1000 UT) capsule Take 1,000 Units by mouth daily.  . cyanocobalamin (VITAMIN B12) 1000 MCG tablet Take 1 tablet (1,000 mcg total) by mouth daily.  . dapsone 100 MG tablet Take 100 mg by mouth daily.  [name] docusate sodium (COLACE) 100 MG capsule Take 100 mg by mouth daily as needed for mild constipation or moderate constipation.  [name] escitalopram (LEXAPRO) 10 MG tablet Take 1 tablet (10 mg total) by mouth daily.  . ferrous sulfate 325 (65 FE) MG EC tablet Take 325 mg by  mouth 2 (two) times daily.  . folic acid (FOLVITE) 1 MG tablet Take 1 tablet (1 mg total) by mouth daily.  [name] levothyroxine (SYNTHROID) 125 MCG tablet Take 125 mcg by mouth daily.  . methylphenidate (RITALIN) 5 MG tablet Take 0.5 tablets (2.5 mg total) by mouth daily. Each morning  . triamcinolone ointment (KENALOG) 0.1 % Apply to affected area topically 2 (two) times daily.  . valACYclovir (VALTREX) 500 MG tablet Take 500 mg by mouth daily.  . [DISCONTINUED] prochlorperazine (COMPAZINE) 10 MG tablet Take 1 tablet (10 mg total) by mouth every 6 (six) hours as needed (Nausea or vomiting).   No facility-administered encounter  medications on file as of 04/18/2023.    Review of Systems  Immunization History  Administered Date(s) Administered  . COVID-19, mRNA, vaccine(Comirnaty)12 years and older 07/05/2022  . DTaP / Hep B / IPV 05/04/2017  . DTaP / HiB / IPV 03/06/2018  . Fluad Quad(high Dose 65+) 06/09/2019, 06/25/2020, 07/25/2021, 05/30/2022  . HIB (PRP-T) 05/04/2017  . Hepatitis B, ADULT 03/14/2017, 03/06/2018  . IPV 01/28/1998  . Influenza, High Dose Seasonal PF 06/16/2018  . Influenza,inj,Quad PF,6+ Mos 06/16/2013, 07/03/2014, 06/11/2015, 05/31/2017  . Influenza-Unspecified 07/29/2012  . PFIZER(Purple Top)SARS-COV-2 Vaccination 10/26/2019, 11/20/2019, 07/22/2020  . [employer], [employer] 26yrs & up 06/27/2021  . Pneumococcal Conjugate-13 10/15/2013, 05/04/2017, 03/06/2018  . Pneumococcal Polysaccharide-23 06/16/2013  . Respiratory Syncytial Virus Vaccine,Recomb Aduvanted(Arexvy) 05/30/2022  . Td 02/10/1998, 07/03/2014   Pertinent  Health Maintenance Due  Topic Date Due  . INFLUENZA VACCINE  04/19/2023  . Colonoscopy  09/24/2023      07/26/2022   10:22 AM 08/18/2022    9:00 AM 08/30/2022   11:01 AM 09/06/2022   10:00 AM 02/15/2023    1:29 PM  Fall Risk  Falls in the past year?     1  Was there an injury with Fall?     1  Fall Risk Category Calculator     3  (RETIRED) Patient Fall Risk Level Low fall risk Low fall risk Low fall risk Low fall risk   Fall risk Follow up     Falls evaluation completed   Functional Status Survey:    Vitals:   04/18/23 1520  BP: 124/75  Pulse: 78  Resp: 16  Temp: 97.9 F (36.6 C)  SpO2: 94%  Weight: 168 lb 4.8 oz (76.3 kg)  Height: 5\' 7"  (1.702 m)   Body mass index is 26.36 kg/m. Physical Exam  Labs reviewed: Recent Labs    02/14/23 0801 03/14/23 0755 04/09/23 1259  NA 139 140 136  K 4.0 4.4 4.4  CL 104 106 102  CO2 25 21* 25  GLUCOSE 111* 77 90  BUN 17 24* 22  CREATININE 0.91 1.00 1.08  CALCIUM 9.4 9.6 9.9    Recent  Labs    02/14/23 0801 03/14/23 0755 04/09/23 1259  AST 22 25 23   ALT 18 21 19   ALKPHOS 70 88 82  BILITOT 0.5 0.5 0.5  PROT 6.2* 7.0 6.5  ALBUMIN 3.9 4.3 4.1    Recent Labs    03/14/23 0755 04/09/23 1259 04/17/23 1353  WBC 4.1 3.7* 3.7*  NEUTROABS 2.5 2.6 2.7  HGB 9.8* 8.1* 8.2*  HCT 30.8* 25.5* 25.5*  MCV 114.9* 113.3* 113.8*  PLT 44* 41* 31*    Lab Results  Component Value Date   TSH 0.02 (L) 08/04/2021   Lab Results  Component Value Date   HGBA1C 5.4 03/27/2012   Lab Results  Component Value Date   CHOL 178 02/13/2023   HDL 42 02/13/2023   LDLCALC 113 02/13/2023   LDLDIRECT 155.8 11/13/2012   TRIG 167 (A) 02/13/2023   CHOLHDL 4 09/06/2017    Significant Diagnostic Results in last 30 days:  CT Head Wo Contrast  Result Date: 04/20/2023 CLINICAL DATA:  Head and neck trauma, fall. EXAM: CT HEAD WITHOUT CONTRAST CT CERVICAL SPINE WITHOUT CONTRAST TECHNIQUE: Multidetector CT imaging of the head and cervical spine was performed following the standard protocol without intravenous contrast. Multiplanar CT image reconstructions of the cervical spine were also generated. RADIATION DOSE REDUCTION: This exam was performed according to the departmental dose-optimization program which includes automated exposure control, adjustment of the mA and/or kV according to patient size and/or use of iterative reconstruction technique. COMPARISON:  01/11/2023. FINDINGS: CT HEAD FINDINGS Brain: No acute intracranial hemorrhage, midline shift or mass effect. No extra-axial fluid collection. Periventricular white matter hypodensities are present bilaterally. No hydrocephalus. 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 Vascular: No hyperdense vessel or unexpected calcification. Skull: No acute fracture. Multiple scattered lucencies are present in the calvarium which are unchanged from the prior exam. Sinuses/Orbits: No acute finding. Other: None. CT CERVICAL SPINE FINDINGS  Alignment: Minimal anterolisthesis at C4-C5. Skull base and vertebrae: No acute fracture. No primary bone lesion or focal pathologic process. Soft tissues and spinal canal: No prevertebral fluid or swelling. No visible canal hematoma. Disc levels: Mild intervertebral disc space narrowing and uncovertebral osteophyte formation, most pronounced at C5-C7. Mild facet arthropathy bilaterally. Upper chest: No acute abnormality. Other: Carotid artery calcification. IMPRESSION: 1. Stable head CT with no acute intracranial process. 2. Multilevel degenerative changes in the cervical spine without evidence of acute fracture. Electronically Signed   By: Thornell Sartorius M.D.   On: 04/20/2023 00:36   CT Cervical Spine Wo Contrast  Result Date: 04/20/2023 CLINICAL DATA:  Head and neck trauma, fall. EXAM: CT HEAD WITHOUT CONTRAST CT CERVICAL SPINE WITHOUT CONTRAST TECHNIQUE: Multidetector CT imaging of the head and cervical spine was performed following the standard protocol without intravenous contrast. Multiplanar CT image reconstructions of the cervical spine were also generated. RADIATION DOSE REDUCTION: This exam was performed according to the departmental dose-optimization program which includes automated exposure control, adjustment of the mA and/or kV according to patient size and/or use of iterative reconstruction technique. COMPARISON:  01/11/2023. FINDINGS: CT HEAD FINDINGS Brain: No acute intracranial hemorrhage, midline shift or mass effect. No extra-axial fluid collection. Periventricular white matter hypodensities are present bilaterally. No hydrocephalus. 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 Vascular: No hyperdense vessel or unexpected calcification. Skull: No acute fracture. Multiple scattered lucencies are present in the calvarium which are unchanged from the prior exam. Sinuses/Orbits: No acute finding. Other: None. CT CERVICAL SPINE FINDINGS Alignment: Minimal anterolisthesis at C4-C5.  Skull base and vertebrae: No acute fracture. No primary bone lesion or focal pathologic process. Soft tissues and spinal canal: No prevertebral fluid or swelling. No visible canal hematoma. Disc levels: Mild intervertebral disc space narrowing and uncovertebral osteophyte formation, most pronounced at C5-C7. Mild facet arthropathy bilaterally. Upper chest: No acute abnormality. Other: Carotid artery calcification. IMPRESSION: 1. Stable head CT with no acute intracranial process. 2. Multilevel degenerative changes in the cervical spine without evidence of acute fracture. Electronically Signed   By: Thornell Sartorius M.D.   On: 04/20/2023 00:36    Assessment/Plan There are no diagnoses linked to this encounter.   Family/ staff Communication: ***  Labs/tests  ordered:  ***   This encounter was created in error - please disregard.

## 2023-04-19 ENCOUNTER — Emergency Department (HOSPITAL_BASED_OUTPATIENT_CLINIC_OR_DEPARTMENT_OTHER): Payer: Medicare Other

## 2023-04-19 ENCOUNTER — Other Ambulatory Visit: Payer: Self-pay

## 2023-04-19 ENCOUNTER — Encounter (HOSPITAL_BASED_OUTPATIENT_CLINIC_OR_DEPARTMENT_OTHER): Payer: Self-pay | Admitting: Emergency Medicine

## 2023-04-19 ENCOUNTER — Telehealth: Payer: Self-pay | Admitting: *Deleted

## 2023-04-19 ENCOUNTER — Telehealth: Payer: Self-pay | Admitting: Internal Medicine

## 2023-04-19 DIAGNOSIS — D696 Thrombocytopenia, unspecified: Secondary | ICD-10-CM | POA: Insufficient documentation

## 2023-04-19 DIAGNOSIS — D649 Anemia, unspecified: Secondary | ICD-10-CM

## 2023-04-19 DIAGNOSIS — E039 Hypothyroidism, unspecified: Secondary | ICD-10-CM | POA: Insufficient documentation

## 2023-04-19 DIAGNOSIS — Z8673 Personal history of transient ischemic attack (TIA), and cerebral infarction without residual deficits: Secondary | ICD-10-CM | POA: Diagnosis not present

## 2023-04-19 DIAGNOSIS — J45901 Unspecified asthma with (acute) exacerbation: Secondary | ICD-10-CM | POA: Diagnosis not present

## 2023-04-19 DIAGNOSIS — I10 Essential (primary) hypertension: Secondary | ICD-10-CM | POA: Insufficient documentation

## 2023-04-19 DIAGNOSIS — S0990XA Unspecified injury of head, initial encounter: Secondary | ICD-10-CM | POA: Diagnosis present

## 2023-04-19 DIAGNOSIS — J45909 Unspecified asthma, uncomplicated: Secondary | ICD-10-CM | POA: Insufficient documentation

## 2023-04-19 DIAGNOSIS — Z79899 Other long term (current) drug therapy: Secondary | ICD-10-CM | POA: Diagnosis not present

## 2023-04-19 DIAGNOSIS — W010XXA Fall on same level from slipping, tripping and stumbling without subsequent striking against object, initial encounter: Secondary | ICD-10-CM | POA: Diagnosis not present

## 2023-04-19 DIAGNOSIS — C9000 Multiple myeloma not having achieved remission: Secondary | ICD-10-CM

## 2023-04-19 DIAGNOSIS — S0093XA Contusion of unspecified part of head, initial encounter: Secondary | ICD-10-CM | POA: Diagnosis not present

## 2023-04-19 NOTE — Telephone Encounter (Signed)
Patient was having Diarrhea and 2 episodes of vomiting today with fatigue And Poor Appetite Swabbed for Flu and is positive Will watch vitals every 4 hours Cannot get pharmacy tonight but if he gets worse will go to ED Tomorrow consider Tamilflu if his nausea is better

## 2023-04-19 NOTE — Telephone Encounter (Addendum)
Call from Mrs. Blaes asking for MD thoughts on lab work of 04/18/23 and his positive stool card results and what needs to be done.  Per Dr. Truett Perna: Will await results of bone marrow biopsy. No need to transfuse at this time. Suggests continue ferrous sulfate bid and referral to [facility] (last colonoscopy 2015 per Dr. Juanda Chance). Mrs. Kazee agrees to this plan. F/U as scheduled. Urine was negative for blood.

## 2023-04-19 NOTE — ED Triage Notes (Signed)
Presents from [facility] via EMS for mechanical fall today, slipped unwitnessed EMS notes hematoma to back of head, ambulatory at scene and in triage, walker at baseline.  Not on a blood thinner.   Endorses HA.  Denies dizziness, neck pain with palpation or ROM, N/V  No memory concerns per facility, A&Ox4 in triage exam. Tested positive for flu yesterday

## 2023-04-20 ENCOUNTER — Other Ambulatory Visit: Payer: Self-pay | Admitting: *Deleted

## 2023-04-20 ENCOUNTER — Encounter: Payer: Self-pay | Admitting: Adult Health

## 2023-04-20 ENCOUNTER — Non-Acute Institutional Stay (SKILLED_NURSING_FACILITY): Payer: Medicare Other | Admitting: Adult Health

## 2023-04-20 ENCOUNTER — Emergency Department (HOSPITAL_BASED_OUTPATIENT_CLINIC_OR_DEPARTMENT_OTHER)
Admission: EM | Admit: 2023-04-20 | Discharge: 2023-04-20 | Disposition: A | Discharge status: Home / Self Care | Payer: Medicare Other | Attending: Emergency Medicine | Admitting: Emergency Medicine

## 2023-04-20 ENCOUNTER — Emergency Department (HOSPITAL_BASED_OUTPATIENT_CLINIC_OR_DEPARTMENT_OTHER): Payer: Medicare Other

## 2023-04-20 DIAGNOSIS — D649 Anemia, unspecified: Secondary | ICD-10-CM

## 2023-04-20 DIAGNOSIS — D61818 Other pancytopenia: Secondary | ICD-10-CM

## 2023-04-20 DIAGNOSIS — R195 Other fecal abnormalities: Secondary | ICD-10-CM | POA: Diagnosis not present

## 2023-04-20 DIAGNOSIS — D500 Iron deficiency anemia secondary to blood loss (chronic): Secondary | ICD-10-CM

## 2023-04-20 DIAGNOSIS — J101 Influenza due to other identified influenza virus with other respiratory manifestations: Secondary | ICD-10-CM | POA: Diagnosis not present

## 2023-04-20 DIAGNOSIS — W19XXXA Unspecified fall, initial encounter: Secondary | ICD-10-CM

## 2023-04-20 DIAGNOSIS — S0990XA Unspecified injury of head, initial encounter: Secondary | ICD-10-CM

## 2023-04-20 DIAGNOSIS — S0093XA Contusion of unspecified part of head, initial encounter: Secondary | ICD-10-CM | POA: Diagnosis not present

## 2023-04-20 DIAGNOSIS — C9000 Multiple myeloma not having achieved remission: Secondary | ICD-10-CM

## 2023-04-20 MED ORDER — PANTOPRAZOLE SODIUM 40 MG PO TBEC
40.0000 mg | DELAYED_RELEASE_TABLET | Freq: Every day | ORAL | Status: DC
Start: 2023-04-20 — End: 2023-04-20

## 2023-04-20 MED ORDER — PANTOPRAZOLE SODIUM 40 MG PO TBEC
40.0000 mg | DELAYED_RELEASE_TABLET | Freq: Every day | ORAL | Status: DC
Start: 2023-04-20 — End: 2023-12-13

## 2023-04-20 MED ORDER — OSELTAMIVIR PHOSPHATE 75 MG PO CAPS
75.0000 mg | ORAL_CAPSULE | Freq: Two times a day (BID) | ORAL | Status: AC
Start: 2023-04-20 — End: 2023-04-25

## 2023-04-20 NOTE — ED Provider Notes (Signed)
[place] EMERGENCY DEPARTMENT AT [facility] Provider Note  CSN: [id number] Arrival date & time: 04/19/23 2254  Chief Complaint(s) Fall (Mechanical, head injury, not on thinner)  HPI Andre [name] is a 72 y.o. male with a past medical history listed below including pancytopenia secondary to multiple myeloma and CAR-T therapy which also resulted and lower extremity weakness and frequent falls who is currently living in a skilled nursing facility here for mechanical fall due to loss of balance while trying to pick up an envelope on the floor.  Patient reports mild occipital head trauma.  No loss of consciousness.  No neck pain.  No back pain.  No other injuries resulting from the fall.  Patient denies any lightheadedness or dizziness prompting the fall.  No other prodromal symptoms.   Fall    Past Medical History Past Medical History:  Diagnosis Date   Allergy    Anemia    Arthritis    Asthma    no treatment x 20 years   Depression    Double vision    occurs at times    Duodenal ulcer    GERD (gastroesophageal reflux disease)    Hyperlipidemia    Hypertension    Hypothyroidism    Multiple myeloma 07/04/2011   Thyroid disease    Patient Active Problem List   Diagnosis Date Noted   Hypokalemia 11/13/2022   Reduced mobility 10/13/2022   Self-care deficit 10/13/2022   Physical deconditioning 10/02/2022   History of engineered cell therapy infusion 09/26/2022   Meteorism 02/09/2022   Memory problem 08/30/2021   Accidental fall 02/19/2021   Periorbital contusion of right eye 02/19/2021   Contusion of right hand 02/19/2021   Hemorrhoids 11/20/2019   Neuropathy due to chemotherapeutic drug (HCC) 09/01/2019   Leukopenia due to antineoplastic chemotherapy (HCC) 02/22/2017   Dry skin dermatitis 08/16/2016   Peripheral edema 04/26/2016   Bone marrow transplant status (HCC) 03/14/2016   Debility 02/26/2016   Back pain 02/26/2016   Febrile neutropenia (HCC)  02/15/2016   Patient in clinical research study 01/10/2016   Rash and nonspecific skin eruption 01/06/2016   HCAP (healthcare-associated pneumonia)    GERD (gastroesophageal reflux disease)    Dehydration 12/22/2015   Weakness 12/22/2015   Asthma exacerbation 12/22/2015   Hyponatremia 12/22/2015   General weakness    Multiple myeloma not having achieved remission (HCC)    Sepsis (HCC)    Chronic anemia    SBO (small bowel obstruction) (HCC) 11/07/2015   Constipation    Actinic keratosis 07/08/2015   Transient global amnesia 06/07/2015   Diarrhea 09/17/2014   Raynaud's phenomenon 09/17/2013   Hemorrhage of rectum and anus 09/15/2013   Skin lesion, superficial 07/29/2013   Cough 01/10/2013   Abnormal brain MRI 10/08/2012   Vitamin D deficiency 09/30/2012   Hypothyroidism 09/30/2012   Hypogonadism male 09/30/2012   Fatigue 09/27/2012   Insomnia 09/27/2012   Reactive depression (situational) 09/27/2012   Goals of care, counseling/discussion 09/27/2012   TIA (transient ischemic attack) 03/27/2012   Transient diplopia 03/27/2012   HYPERLIPIDEMIA 05/30/2010   ANEMIA 05/30/2010   Asthma 05/30/2010   SCHATZKI'S RING 05/30/2010   Peptic ulcer disease 05/30/2010   HIATAL HERNIA 05/30/2010   SLEEP APNEA, MILD 05/30/2010   TRANSAMINASES, SERUM, ELEVATED 05/30/2010   Home Medication(s) Prior to Admission medications   Medication Sig Start Date End Date Taking? Authorizing Provider  acetaminophen (TYLENOL) 650 MG CR tablet Take 650 mg by mouth every 6 (six) hours as needed for  pain.    [provider]  calcium carbonate (TUMS EX) 750 MG chewable tablet Chew 1 tablet by mouth daily.    [provider]  calcium-vitamin D (OSCAL WITH D) 500-200 MG-UNIT tablet Take 1 tablet by mouth daily. 08/14/22   [name], Georgina Quint, MD  Cholecalciferol (VITAMIN D3) 25 MCG (1000 UT) capsule Take 1,000 Units by mouth daily.    [provider]  cyanocobalamin (VITAMIN B12)  1000 MCG tablet Take 1 tablet (1,000 mcg total) by mouth daily. 04/09/23   Rana Snare, NP  dapsone 100 MG tablet Take 100 mg by mouth daily.    Ladene Artist, MD  docusate sodium (COLACE) 100 MG capsule Take 100 mg by mouth daily as needed for mild constipation or moderate constipation.    [provider]  escitalopram (LEXAPRO) 10 MG tablet Take 1 tablet (10 mg total) by mouth daily. 06/27/22   [name], Georgina Quint, MD  ferrous sulfate 325 (65 FE) MG EC tablet Take 325 mg by mouth 2 (two) times daily.    [provider]  folic acid (FOLVITE) 1 MG tablet Take 1 tablet (1 mg total) by mouth daily. 04/09/23   Rana Snare, NP  levothyroxine (SYNTHROID) 125 MCG tablet Take 125 mcg by mouth daily. 04/12/23   [provider]  methylphenidate (RITALIN) 5 MG tablet Take 0.5 tablets (2.5 mg total) by mouth daily. Each morning 04/05/23   Fletcher Anon, NP  triamcinolone ointment (KENALOG) 0.1 % Apply to affected area topically 2 (two) times daily. 08/16/22   [name], Georgina Quint, MD  valACYclovir (VALTREX) 500 MG tablet Take 500 mg by mouth daily.    [provider]  prochlorperazine (COMPAZINE) 10 MG tablet Take 1 tablet (10 mg total) by mouth every 6 (six) hours as needed (Nausea or vomiting). 12/14/20 12/12/21  Magrinat, Valentino Hue, MD                                                                                                                                    Allergies Atorvastatin, Rosuvastatin, Crestor [rosuvastatin calcium], Lipitor [atorvastatin calcium], and Septra [sulfamethoxazole-trimethoprim]  Review of Systems Review of Systems As noted in HPI  Physical Exam Vital Signs  I have reviewed the triage vital signs BP (!) 128/45   Pulse 68   Temp 98 F (36.7 C) (Oral)   Resp 18   Wt 79.4 kg   SpO2 94%   BMI 27.41 kg/m   Physical Exam Constitutional:      General: He is not in acute distress.    Appearance: He is well-developed. He is not  diaphoretic.  HENT:     Head: Normocephalic. Contusion present. No laceration.      Right Ear: External ear normal.     Left Ear: External ear normal.  Eyes:     General: No scleral icterus.       Right eye: No discharge.  Left eye: No discharge.     Conjunctiva/sclera: Conjunctivae normal.     Pupils: Pupils are equal, round, and reactive to light.  Cardiovascular:     Rate and Rhythm: Regular rhythm.     Pulses:          Radial pulses are 2+ on the right side and 2+ on the left side.       Dorsalis pedis pulses are 2+ on the right side and 2+ on the left side.     Heart sounds: Normal heart sounds. No murmur heard.    No friction rub. No gallop.  Pulmonary:     Effort: Pulmonary effort is normal. No respiratory distress.     Breath sounds: Normal breath sounds. No stridor.  Abdominal:     General: There is no distension.     Palpations: Abdomen is soft.     Tenderness: There is no abdominal tenderness.  Musculoskeletal:     Cervical back: Normal range of motion and neck supple. No bony tenderness.     Thoracic back: No bony tenderness.     Lumbar back: No bony tenderness.     Comments: Clavicle stable. Chest stable to AP/Lat compression. Pelvis stable to Lat compression. No obvious extremity deformity. No chest or abdominal wall contusion.  Skin:    General: Skin is warm.  Neurological:     Mental Status: He is alert and oriented to person, place, and time.     GCS: GCS eye subscore is 4. GCS verbal subscore is 5. GCS motor subscore is 6.     Comments: Moving all extremities      ED Results and Treatments Labs (all labs ordered are listed, but only abnormal results are displayed) Labs Reviewed - No data to display                                                                                                                       EKG  EKG Interpretation Date/Time:    Ventricular Rate:    PR Interval:    QRS Duration:    QT Interval:    QTC Calculation:    R Axis:      Text Interpretation:         Radiology CT Head Wo Contrast  Result Date: 04/20/2023 CLINICAL DATA:  Head and neck trauma, fall. EXAM: CT HEAD WITHOUT CONTRAST CT CERVICAL SPINE WITHOUT CONTRAST TECHNIQUE: Multidetector CT imaging of the head and cervical spine was performed following the standard protocol without intravenous contrast. Multiplanar CT image reconstructions of the cervical spine were also generated. RADIATION DOSE REDUCTION: This exam was performed according to the departmental dose-optimization program which includes automated exposure control, adjustment of the mA and/or kV according to patient size and/or use of iterative reconstruction technique. COMPARISON:  01/11/2023. FINDINGS: CT HEAD FINDINGS Brain: No acute intracranial hemorrhage, midline shift or mass effect. No extra-axial fluid collection. Periventricular white matter hypodensities are present bilaterally. No hydrocephalus. An old infarct is noted in the  left cerebellar hemisphere. Vascular: No hyperdense vessel or unexpected calcification. Skull: No acute fracture. Multiple scattered lucencies are present in the calvarium which are unchanged from the prior exam. Sinuses/Orbits: No acute finding. Other: None. CT CERVICAL SPINE FINDINGS Alignment: Minimal anterolisthesis at C4-C5. Skull base and vertebrae: No acute fracture. No primary bone lesion or focal pathologic process. Soft tissues and spinal canal: No prevertebral fluid or swelling. No visible canal hematoma. Disc levels: Mild intervertebral disc space narrowing and uncovertebral osteophyte formation, most pronounced at C5-C7. Mild facet arthropathy bilaterally. Upper chest: No acute abnormality. Other: Carotid artery calcification. IMPRESSION: 1. Stable head CT with no acute intracranial process. 2. Multilevel degenerative changes in the cervical spine without evidence of acute fracture. Electronically Signed   By: Thornell Sartorius M.D.   On: 04/20/2023 00:36    CT Cervical Spine Wo Contrast  Result Date: 04/20/2023 CLINICAL DATA:  Head and neck trauma, fall. EXAM: CT HEAD WITHOUT CONTRAST CT CERVICAL SPINE WITHOUT CONTRAST TECHNIQUE: Multidetector CT imaging of the head and cervical spine was performed following the standard protocol without intravenous contrast. Multiplanar CT image reconstructions of the cervical spine were also generated. RADIATION DOSE REDUCTION: This exam was performed according to the departmental dose-optimization program which includes automated exposure control, adjustment of the mA and/or kV according to patient size and/or use of iterative reconstruction technique. COMPARISON:  01/11/2023. FINDINGS: CT HEAD FINDINGS Brain: No acute intracranial hemorrhage, midline shift or mass effect. No extra-axial fluid collection. Periventricular white matter hypodensities are present bilaterally. No hydrocephalus. 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 Vascular: No hyperdense vessel or unexpected calcification. Skull: No acute fracture. Multiple scattered lucencies are present in the calvarium which are unchanged from the prior exam. Sinuses/Orbits: No acute finding. Other: None. CT CERVICAL SPINE FINDINGS Alignment: Minimal anterolisthesis at C4-C5. Skull base and vertebrae: No acute fracture. No primary bone lesion or focal pathologic process. Soft tissues and spinal canal: No prevertebral fluid or swelling. No visible canal hematoma. Disc levels: Mild intervertebral disc space narrowing and uncovertebral osteophyte formation, most pronounced at C5-C7. Mild facet arthropathy bilaterally. Upper chest: No acute abnormality. Other: Carotid artery calcification. IMPRESSION: 1. Stable head CT with no acute intracranial process. 2. Multilevel degenerative changes in the cervical spine without evidence of acute fracture. Electronically Signed   By: Thornell Sartorius M.D.   On: 04/20/2023 00:36    Medications Ordered in ED Medications - No  data to display Procedures Procedures  (including critical care time) Medical Decision Making / ED Course   Medical Decision Making Amount and/or Complexity of Data Reviewed Radiology: ordered and independent interpretation performed. Decision-making details documented in ED Course.    Mechanical fall resulting in minor head trauma.  Given the fact the patient has thrombocytopenia, CT head and cervical spine were ordered.  CT head negative for ICH.  CT cervical spine negative for any acute injuries.  Patient does not require any labs.  Had recent labs 2 days ago showing stable pancytopenia.    Final Clinical Impression(s) / ED Diagnoses Final diagnoses:  Fall at nursing home, initial encounter  Minor head injury, initial encounter   The patient appears reasonably screened and/or stabilized for discharge and I doubt any other medical condition or other [facility] requiring further screening, evaluation, or treatment in the ED at this time. I have discussed the findings, Dx and Tx plan with the patient/family who expressed understanding and agree(s) with the plan. Discharge instructions discussed at length. The patient/family was given  strict return precautions who verbalized understanding of the instructions. No further questions at time of discharge.  Disposition: Discharge  Condition: Good  ED Discharge Orders     None         Follow Up: [name], MD [address] [place] [address] [phone]  Call  as needed  Ladene Artist, MD [address] [place] [address] [phone]  Call  as needed    This chart was dictated using voice recognition software.  Despite best efforts to proofread,  errors can occur which can change the documentation meaning.    Nira Conn, MD 04/20/23 [phone]

## 2023-04-20 NOTE — Progress Notes (Signed)
Notified that he fell again at SNF bending over to pick up an envelope. Hgb and platelet are low according to 8/1 labs from SNF. Per Dr. Truett Perna: Get him in next week for lab/type and screen and then transfuse next day 2 units blood. Scheduling message sent.

## 2023-04-20 NOTE — Progress Notes (Signed)
Location:  [facility] Room Number: 142A Place of Service:  SNF [phone]) Provider:  Tamsen Roers, MD  Patient Care Team: [name], MD as PCP - General (Internal Medicine) Shirlean Schlein, MD as Consulting Physician (Neurology) Eddie Candle, MD as Consulting Physician (Internal Medicine) York Spaniel, MD (Inactive) as Consulting Physician (Neurology) Sharlett Iles, DDS as Referring Physician (Oral Surgery) Ladene Artist, MD as Consulting Physician (Oncology) Fletcher Anon, NP as Nurse Practitioner (Nurse Practitioner)  Extended Emergency Contact Information Primary Emergency Contact: Ong,[name] Address: [address]          [place], [place] [address] [name] Home Phone: [phone] Relation: Spouse  Code Status:  DNR Goals of care: Advanced Directive information    04/20/2023    8:21 AM  Advanced Directives  Does Patient Have a Medical Advance Directive? Yes  Type of Advance Directive Out of facility DNR (pink MOST or yellow form)  Does patient want to make changes to medical advance directive? No - Patient declined  Would patient like information on creating a medical advance directive? No - Patient declined  Pre-existing out of facility DNR order (yellow form or pink MOST form) Yellow form placed in chart (order not valid for inpatient use)     Chief Complaint  Patient presents with   Acute Visit    Patient is being seen for the flu   Immunizations    Patient is due for pneumonia, covid and flu vaccine    Quality Metric Gaps    Discuss the need for AWV    HPI:  Pt is a 72 y.o. male seen today for an acute visit for flu A +  Diagnosed with multiple myeloma in 2012. Has received chemo, dexamethasone, and CAR T therapy.   Followed by heme/onc for multiple myeloma and thrombocytopenia. He received an Immunoglobulin infusion, which he does monthly.   Note  in May of 2024 reported remission of myeloma.   Symptoms of nausea, vomiting,and diarrhea started on 8/1.  Flu and covid swab were done and he was positive for Flu A.  He fell on 8/1 in the early hrs and was sent to the ED as he hit his head.  CT of the head and cervical area showed no acute process.   He has chronic pancytopenia.  Reports more fatigue.  Hgb trending downward to 7.1 on 8/1.  Labs being faxed to oncology He has heme pos stool. Oncology sent recommendation for GI referral.  Vitals are stable No obvious bleeding at this time. No abd pain.  Nausea, diarrhea have resolved.  He feels better just tired.      Past Medical History:  Diagnosis Date   Allergy    Anemia    Arthritis    Asthma    no treatment x 20 years   Depression    Double vision    occurs at times    Duodenal ulcer    GERD (gastroesophageal reflux disease)    Hyperlipidemia    Hypertension    Hypothyroidism    Multiple myeloma 07/04/2011   Thyroid disease    Past Surgical History:  Procedure Laterality Date   BONE MARROW TRANSPLANT  2011   for MM   CARDIOLITE STUDY  11/25/2003   NORMAL   TONSILLECTOMY      Allergies  Allergen Reactions   Atorvastatin Other (See Comments)   Rosuvastatin     Other reaction(s): Liver Disorder  Crestor [Rosuvastatin Calcium]     ADVERSE EFFECTS ON LIVER   Lipitor [Atorvastatin Calcium]    Septra [Sulfamethoxazole-Trimethoprim] Rash    Outpatient Encounter Medications as of 04/20/2023  Medication Sig   acetaminophen (TYLENOL) 650 MG CR tablet Take 650 mg by mouth every 6 (six) hours as needed for pain.   calcium carbonate (TUMS EX) 750 MG chewable tablet Chew 1 tablet by mouth daily.   calcium-vitamin D (OSCAL WITH D) 500-200 MG-UNIT tablet Take 1 tablet by mouth daily.   Cholecalciferol (VITAMIN D3) 25 MCG (1000 UT) capsule Take 1,000 Units by mouth daily.   cyanocobalamin (VITAMIN B12) 1000 MCG tablet Take 1 tablet (1,000 mcg total) by mouth daily.    dapsone 100 MG tablet Take 100 mg by mouth daily.   docusate sodium (COLACE) 100 MG capsule Take 100 mg by mouth daily as needed for mild constipation or moderate constipation.   escitalopram (LEXAPRO) 10 MG tablet Take 1 tablet (10 mg total) by mouth daily.   ferrous sulfate 325 (65 FE) MG EC tablet Take 325 mg by mouth 2 (two) times daily.   folic acid (FOLVITE) 1 MG tablet Take 1 tablet (1 mg total) by mouth daily.   levothyroxine (SYNTHROID) 125 MCG tablet Take 125 mcg by mouth daily.   methylphenidate (RITALIN) 5 MG tablet Take 0.5 tablets (2.5 mg total) by mouth daily. Each morning   triamcinolone ointment (KENALOG) 0.1 % Apply to affected area topically 2 (two) times daily.   valACYclovir (VALTREX) 500 MG tablet Take 500 mg by mouth daily.   [DISCONTINUED] prochlorperazine (COMPAZINE) 10 MG tablet Take 1 tablet (10 mg total) by mouth every 6 (six) hours as needed (Nausea or vomiting).   No facility-administered encounter medications on file as of 04/20/2023.    Review of Systems  Constitutional:  Positive for activity change and fatigue. Negative for appetite change, chills, diaphoresis, fever and unexpected weight change.  Respiratory:  Negative for cough, shortness of breath, wheezing and stridor.   Cardiovascular:  Negative for chest pain, palpitations and leg swelling.  Gastrointestinal:  Negative for abdominal distention, abdominal pain, constipation and diarrhea.  Genitourinary:  Negative for difficulty urinating and dysuria.  Musculoskeletal:  Positive for gait problem. Negative for arthralgias, back pain, joint swelling and myalgias.  Neurological:  Negative for dizziness, seizures, syncope, facial asymmetry, speech difficulty, weakness and headaches.  Hematological:  Negative for adenopathy. Does not bruise/bleed easily.  Psychiatric/Behavioral:  Negative for agitation, behavioral problems and confusion.     Immunization History  Administered Date(s) Administered   COVID-19,  mRNA, vaccine(Comirnaty)12 years and older 07/05/2022   DTaP / Hep B / IPV 05/04/2017   DTaP / HiB / IPV 03/06/2018   Fluad Quad(high Dose 65+) 06/09/2019, 06/25/2020, 07/25/2021, 05/30/2022   HIB (PRP-T) 05/04/2017   Hepatitis B, ADULT 03/14/2017, 03/06/2018   IPV 01/28/1998   Influenza, High Dose Seasonal PF 06/16/2018   Influenza,inj,Quad PF,6+ Mos 06/16/2013, 07/03/2014, 06/11/2015, 05/31/2017   Influenza-Unspecified 07/29/2012   PFIZER(Purple Top)SARS-COV-2 Vaccination 10/26/2019, 11/20/2019, 07/22/2020   Pfizer Covid-19 Vaccine Bivalent Booster 70yrs & up 06/27/2021   Pneumococcal Conjugate-13 10/15/2013, 05/04/2017, 03/06/2018   Pneumococcal Polysaccharide-23 06/16/2013   Respiratory Syncytial Virus Vaccine,Recomb Aduvanted(Arexvy) 05/30/2022   Td 02/10/1998, 07/03/2014   Pertinent  Health Maintenance Due  Topic Date Due   INFLUENZA VACCINE  04/19/2023   Colonoscopy  09/24/2023      07/26/2022   10:22 AM 08/18/2022    9:00 AM 08/30/2022   11:01 AM 09/06/2022   10:00  AM 02/15/2023    1:29 PM  Fall Risk  Falls in the past year?     1  Was there an injury with Fall?     1  Fall Risk Category Calculator     3  (RETIRED) Patient Fall Risk Level Low fall risk Low fall risk Low fall risk Low fall risk   Fall risk Follow up     Falls evaluation completed   Functional Status Survey:    Vitals:   04/20/23 0816  BP: 118/72  Pulse: (!) 102  Resp: 18  Temp: 98.8 F (37.1 C)  TempSrc: Temporal  SpO2: 96%  Weight: 175 lb 11.2 oz (79.7 kg)  Height: 5\' 7"  (1.702 m)   Body mass index is 27.52 kg/m. Physical Exam Vitals and nursing note reviewed.  Constitutional:      General: He is not in acute distress.    Appearance: He is not diaphoretic.  HENT:     Head: Normocephalic and atraumatic.     Mouth/Throat:     Mouth: Mucous membranes are moist.     Pharynx: Oropharynx is clear.  Eyes:     Conjunctiva/sclera: Conjunctivae normal.     Pupils: Pupils are equal, round,  and reactive to light.  Neck:     Thyroid: No thyromegaly.     Vascular: No JVD.     Trachea: No tracheal deviation.  Cardiovascular:     Rate and Rhythm: Normal rate and regular rhythm.     Heart sounds: No murmur heard. Pulmonary:     Effort: Pulmonary effort is normal. No respiratory distress.     Breath sounds: Normal breath sounds. No wheezing.  Abdominal:     General: Bowel sounds are normal. There is no distension.     Palpations: Abdomen is soft.     Tenderness: There is no abdominal tenderness.  Musculoskeletal:     Comments: Trace edema to both ankles  Lymphadenopathy:     Cervical: No cervical adenopathy.  Skin:    General: Skin is warm and dry.  Neurological:     General: No focal deficit present.     Mental Status: He is alert and oriented to person, place, and time. Mental status is at baseline.     Cranial Nerves: No cranial nerve deficit.     Labs reviewed: Recent Labs    02/14/23 0801 03/14/23 0755 04/09/23 1259  NA 139 140 136  K 4.0 4.4 4.4  CL 104 106 102  CO2 25 21* 25  GLUCOSE 111* 77 90  BUN 17 24* 22  CREATININE 0.91 1.00 1.08  CALCIUM 9.4 9.6 9.9   Recent Labs    02/14/23 0801 03/14/23 0755 04/09/23 1259  AST 22 25 23   ALT 18 21 19   ALKPHOS 70 88 82  BILITOT 0.5 0.5 0.5  PROT 6.2* 7.0 6.5  ALBUMIN 3.9 4.3 4.1   Recent Labs    03/14/23 0755 04/09/23 1259 04/17/23 1353  WBC 4.1 3.7* 3.7*  NEUTROABS 2.5 2.6 2.7  HGB 9.8* 8.1* 8.2*  HCT 30.8* 25.5* 25.5*  MCV 114.9* 113.3* 113.8*  PLT 44* 41* 31*   Lab Results  Component Value Date   TSH 0.02 (L) 08/04/2021   Lab Results  Component Value Date   HGBA1C 5.4 03/27/2012   Lab Results  Component Value Date   CHOL 178 02/13/2023   HDL 42 02/13/2023   LDLCALC 113 02/13/2023   LDLDIRECT 155.8 11/13/2012   TRIG 167 (A) 02/13/2023  CHOLHDL 4 09/06/2017    Significant Diagnostic Results in last 30 days:  CT Head Wo Contrast  Result Date: 04/20/2023 CLINICAL DATA:  Head  and neck trauma, fall. EXAM: CT HEAD WITHOUT CONTRAST CT CERVICAL SPINE WITHOUT CONTRAST TECHNIQUE: Multidetector CT imaging of the head and cervical spine was performed following the standard protocol without intravenous contrast. Multiplanar CT image reconstructions of the cervical spine were also generated. RADIATION DOSE REDUCTION: This exam was performed according to the departmental dose-optimization program which includes automated exposure control, adjustment of the mA and/or kV according to patient size and/or use of iterative reconstruction technique. COMPARISON:  01/11/2023. FINDINGS: CT HEAD FINDINGS Brain: No acute intracranial hemorrhage, midline shift or mass effect. No extra-axial fluid collection. Periventricular white matter hypodensities are present bilaterally. No hydrocephalus. 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 Vascular: No hyperdense vessel or unexpected calcification. Skull: No acute fracture. Multiple scattered lucencies are present in the calvarium which are unchanged from the prior exam. Sinuses/Orbits: No acute finding. Other: None. CT CERVICAL SPINE FINDINGS Alignment: Minimal anterolisthesis at C4-C5. Skull base and vertebrae: No acute fracture. No primary bone lesion or focal pathologic process. Soft tissues and spinal canal: No prevertebral fluid or swelling. No visible canal hematoma. Disc levels: Mild intervertebral disc space narrowing and uncovertebral osteophyte formation, most pronounced at C5-C7. Mild facet arthropathy bilaterally. Upper chest: No acute abnormality. Other: Carotid artery calcification. IMPRESSION: 1. Stable head CT with no acute intracranial process. 2. Multilevel degenerative changes in the cervical spine without evidence of acute fracture. Electronically Signed   By: Thornell Sartorius M.D.   On: 04/20/2023 00:36   CT Cervical Spine Wo Contrast  Result Date: 04/20/2023 CLINICAL DATA:  Head and neck trauma, fall. EXAM: CT HEAD WITHOUT  CONTRAST CT CERVICAL SPINE WITHOUT CONTRAST TECHNIQUE: Multidetector CT imaging of the head and cervical spine was performed following the standard protocol without intravenous contrast. Multiplanar CT image reconstructions of the cervical spine were also generated. RADIATION DOSE REDUCTION: This exam was performed according to the departmental dose-optimization program which includes automated exposure control, adjustment of the mA and/or kV according to patient size and/or use of iterative reconstruction technique. COMPARISON:  01/11/2023. FINDINGS: CT HEAD FINDINGS Brain: No acute intracranial hemorrhage, midline shift or mass effect. No extra-axial fluid collection. Periventricular white matter hypodensities are present bilaterally. No hydrocephalus. 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 Vascular: No hyperdense vessel or unexpected calcification. Skull: No acute fracture. Multiple scattered lucencies are present in the calvarium which are unchanged from the prior exam. Sinuses/Orbits: No acute finding. Other: None. CT CERVICAL SPINE FINDINGS Alignment: Minimal anterolisthesis at C4-C5. Skull base and vertebrae: No acute fracture. No primary bone lesion or focal pathologic process. Soft tissues and spinal canal: No prevertebral fluid or swelling. No visible canal hematoma. Disc levels: Mild intervertebral disc space narrowing and uncovertebral osteophyte formation, most pronounced at C5-C7. Mild facet arthropathy bilaterally. Upper chest: No acute abnormality. Other: Carotid artery calcification. IMPRESSION: 1. Stable head CT with no acute intracranial process. 2. Multilevel degenerative changes in the cervical spine without evidence of acute fracture. Electronically Signed   By: Thornell Sartorius M.D.   On: 04/20/2023 00:36    Assessment/Plan  1. Influenza A  - oseltamivir (TAMIFLU) 75 MG capsule; Take 1 capsule (75 mg total) by mouth 2 (two) times daily for 5 days.  Rest encourage  fluid Bland diet Zofran if needed Monitor vitals  Isolation x 7 days   2. Iron  deficiency anemia due to chronic blood loss Heme pos stool GI referral per heme/onc Continue iron F/u with oncology, fax most recent labs to them  3. Pancytopenia (HCC) Plt 29K Hgb 7.1 WBC 2.6 F/U labs done with heme/onc Bone marrow bx pending  4. Heme pos stool Add protonix 40 mg every day while waiting on GI   Family/ staff Communication: attempted to call wife, left message with nurse to communicate.   Labs/tests ordered:  CBC

## 2023-04-23 ENCOUNTER — Ambulatory Visit: Payer: Medicare Other | Admitting: Physician Assistant

## 2023-04-23 ENCOUNTER — Telehealth: Payer: Self-pay

## 2023-04-23 NOTE — Telephone Encounter (Signed)
Fax over medication list to [facility] at [phone]

## 2023-04-24 ENCOUNTER — Inpatient Hospital Stay: Payer: Medicare Other

## 2023-04-26 ENCOUNTER — Ambulatory Visit: Payer: Medicare Other

## 2023-04-27 ENCOUNTER — Telehealth: Payer: Self-pay

## 2023-04-27 NOTE — Telephone Encounter (Signed)
Transition Care Management Unsuccessful Follow-up Telephone Call  Date of discharge and from where:  [facility] 8/2  Attempts:  2nd Attempt  Reason for unsuccessful TCM follow-up call:  No answer/busy   Lenard Forth [facility] Guide, [facility] [phone] [address] [place], [place], [place] [address] Phone: [phone] Email: [email or web address]

## 2023-04-27 NOTE — Telephone Encounter (Signed)
Transition Care Management Unsuccessful Follow-up Telephone Call  Date of discharge and from where:  [facility] 8/2  Attempts:  1st Attempt  Reason for unsuccessful TCM follow-up call:  No answer/busy   Lenard Forth [facility] Guide, [facility] [phone] [address] [place], [place], [place] [address] Phone: [phone] Email: [email or web address]

## 2023-05-02 ENCOUNTER — Ambulatory Visit (INDEPENDENT_AMBULATORY_CARE_PROVIDER_SITE_OTHER): Payer: Medicare Other | Admitting: Gastroenterology

## 2023-05-02 ENCOUNTER — Encounter: Payer: Self-pay | Admitting: Gastroenterology

## 2023-05-02 VITALS — BP 108/63 | HR 80 | Ht 67.0 in | Wt 179.4 lb

## 2023-05-02 DIAGNOSIS — C9000 Multiple myeloma not having achieved remission: Secondary | ICD-10-CM | POA: Diagnosis not present

## 2023-05-02 DIAGNOSIS — R195 Other fecal abnormalities: Secondary | ICD-10-CM | POA: Diagnosis not present

## 2023-05-02 DIAGNOSIS — D638 Anemia in other chronic diseases classified elsewhere: Secondary | ICD-10-CM

## 2023-05-02 NOTE — Progress Notes (Addendum)
05/02/2023 Andre [name] [id number] 1951/04/18   HISTORY OF PRESENT ILLNESS: This is a 72 year old male who is previously a patient of Dr. Regino Schultze.  Last colonoscopy January 2015 at which time he was only found to have internal hemorrhoids.  Previous remote colonoscopy in 2007 did show diverticulosis, however.  Had an upper GI bleed in August 2011 and was found to have multiple ulcers in the bulb of the duodenum that were injected with epi for hemostasis.  He also had a Schatzki's ring at the GE junction and a hiatal hernia.  Biopsy showed chronic duodenitis consistent with peptic duodenitis.  He is here today for evaluation of anemia and Hemoccult positive stools.  He does have multiple myeloma.  Appears that his hemoglobin has been bouncing between 8 to 9.5 g.  Platelet count also very low at 31K most recently.  He has had some treatments recently and has not responded to those.  He is going to be having a bone marrow biopsy in a couple weeks.  He was found to be Hemoccult positive x 3 recently, but denies any overt GI bleeding.  He says that his stools are darker now that he is on iron supplementation.  His wife is with him and expresses that she thinks that he is too weak to proceed with colonoscopy at this time.   Past Medical History:  Diagnosis Date   Allergy    Anemia    Arthritis    Asthma    no treatment x 20 years   Depression    Double vision    occurs at times    Duodenal ulcer    GERD (gastroesophageal reflux disease)    Hyperlipidemia    Hypertension    Hypothyroidism    Multiple myeloma 07/04/2011   Thyroid disease    Past Surgical History:  Procedure Laterality Date   BONE MARROW TRANSPLANT  2011   for MM   CARDIOLITE STUDY  11/25/2003   NORMAL   TONSILLECTOMY      reports that he quit smoking about 54 years ago. His smoking use included cigarettes. He started smoking about 57 years ago. He has never used smokeless tobacco. He reports that he does not  drink alcohol and does not use drugs. family history includes Asthma in his father; Diabetes in his father; Hypertension in his father; Pancreatic cancer in his brother; Stroke in his father; Throat cancer in his mother. Allergies  Allergen Reactions   Atorvastatin Other (See Comments)   Rosuvastatin     Other reaction(s): Liver Disorder   Crestor [Rosuvastatin Calcium]     ADVERSE EFFECTS ON LIVER   Lipitor [Atorvastatin Calcium]    Septra [Sulfamethoxazole-Trimethoprim] Rash      Outpatient Encounter Medications as of 05/02/2023  Medication Sig   acetaminophen (TYLENOL) 650 MG CR tablet Take 650 mg by mouth every 6 (six) hours as needed for pain.   calcium carbonate (TUMS EX) 750 MG chewable tablet Chew 1 tablet by mouth daily.   calcium-vitamin D (OSCAL WITH D) 500-200 MG-UNIT tablet Take 1 tablet by mouth daily.   Cholecalciferol (VITAMIN D3) 25 MCG (1000 UT) capsule Take 1,000 Units by mouth daily.   cyanocobalamin (VITAMIN B12) 1000 MCG tablet Take 1 tablet (1,000 mcg total) by mouth daily.   docusate sodium (COLACE) 100 MG capsule Take 100 mg by mouth daily as needed for mild constipation or moderate constipation.   escitalopram (LEXAPRO) 10 MG tablet Take 1 tablet (10 mg total) by  mouth daily.   ferrous sulfate 325 (65 FE) MG EC tablet Take 325 mg by mouth 2 (two) times daily.   folic acid (FOLVITE) 1 MG tablet Take 1 tablet (1 mg total) by mouth daily.   levothyroxine (SYNTHROID) 125 MCG tablet Take 125 mcg by mouth daily.   methylphenidate (RITALIN) 5 MG tablet Take 0.5 tablets (2.5 mg total) by mouth daily. Each morning   pantoprazole (PROTONIX) 40 MG tablet Take 1 tablet (40 mg total) by mouth daily.   triamcinolone ointment (KENALOG) 0.1 % Apply to affected area topically 2 (two) times daily.   valACYclovir (VALTREX) 500 MG tablet Take 500 mg by mouth daily.   dapsone 100 MG tablet Take 100 mg by mouth daily.   [DISCONTINUED] prochlorperazine (COMPAZINE) 10 MG tablet Take 1  tablet (10 mg total) by mouth every 6 (six) hours as needed (Nausea or vomiting).   No facility-administered encounter medications on file as of 05/02/2023.     REVIEW OF SYSTEMS  : All other systems reviewed and negative except where noted in the History of Present Illness.   PHYSICAL EXAM: BP 108/63 (BP Location: Left Arm, Patient Position: Sitting, Cuff Size: Normal)   Pulse 80   Ht 5\' 7"  (1.702 m)   Wt 179 lb 6 oz (81.4 kg)   SpO2 96%   BMI 28.09 kg/m  General:  Elderly white male in no acute distress Head: Normocephalic and atraumatic Eyes:  Sclerae anicteric, conjunctiva pink. Ears: Normal auditory acuity Lungs: Clear throughout to auscultation; no W/R/R. Heart: Regular rate and rhythm; no M/R/G. Musculoskeletal: Symmetrical with no gross deformities  Skin: No lesions on visible extremities Extremities: No edema  Neurological: Alert oriented x 4, grossly non-focal Psychological:  Alert and cooperative. Normal mood and affect  ASSESSMENT AND PLAN: *Anemia and Hemoccult positive stool: The patient has multiple myeloma since about 2011 or 2012.  Recently has had some treatments and has not responded well to those.  This is undoubtedly contributing to his anemia and his severe thrombocytopenia.  He does not see any overt GI bleeding.  He is here today with his wife.  She feels that he is too weak to proceed with any procedures currently and he would like to avoid them if possible as well.  The referral was placed by Dr. Truett Perna and they see him next week and will discuss further with him.  If he is adamant for him to undergo these procedures then I will need to talk with Dr. Meridee Score to determine the safest approach to this as his platelet count was only 31K on his last check a 12 weeks ago.  Also, with platelet count this low he could just have some mucosal bleeding throughout the GI tract related to that as well.   CC:  [name], MD

## 2023-05-02 NOTE — Progress Notes (Signed)
Attending Physician's Attestation   I have reviewed the chart.   I agree with the Advanced Practitioner's note, impression, and recommendations with any updates as below. Agree that anemia is most likely being driven by his MM.  However with a heme positive stool I do think that the concern and potential role of an endoscopic evaluation since it has been years since anything has been pursued does make sense.  As PA Zehr brings up, having significant thrombocytopenia can lead to mucosal irritation that could lead to heme positive stools as well.  I am happy to move forward, if patient is willing and hemodynamically stable for an upper and lower to be performed.  This felt that he still is to high risk for colonoscopic evaluation and endoscopy could be pursued.  Patient and family will discuss further with Dr. Alcide Evener and were happy to be available after their follow-up in the coming weeks and after bone marrow biopsy has been completed.   Corliss Parish, MD [facility] Advanced Endoscopy Office # [phone]

## 2023-05-02 NOTE — Patient Instructions (Addendum)
Call back after talking with Dr. Truett Perna.   _______________________________________________________  If your blood pressure at your visit was 140/90 or greater, please contact your primary care physician to follow up on this.  _______________________________________________________  If you are age 74 or older, your body mass index should be between 23-30. Your Body mass index is 28.09 kg/m. If this is out of the aforementioned range listed, please consider follow up with your Primary Care Provider.  If you are age 70 or younger, your body mass index should be between 19-25. Your Body mass index is 28.09 kg/m. If this is out of the aformentioned range listed, please consider follow up with your Primary Care Provider.   ________________________________________________________  The [facility] providers would like to encourage you to use [facility] to communicate with providers for non-urgent requests or questions.  Due to long hold times on the telephone, sending your provider a message by [facility] may be a faster and more efficient way to get a response.  Please allow 48 business hours for a response.  Please remember that this is for non-urgent requests.  _______________________________________________________

## 2023-05-05 ENCOUNTER — Inpatient Hospital Stay (HOSPITAL_COMMUNITY)
Admission: EM | Admit: 2023-05-05 | Discharge: 2023-05-11 | DRG: 853 | Disposition: A | Discharge status: Skilled Nursing Facility | Payer: Medicare Other | Source: Skilled Nursing Facility | Attending: Family Medicine | Admitting: Family Medicine

## 2023-05-05 ENCOUNTER — Emergency Department (HOSPITAL_COMMUNITY): Payer: Medicare Other

## 2023-05-05 DIAGNOSIS — J9601 Acute respiratory failure with hypoxia: Secondary | ICD-10-CM | POA: Diagnosis present

## 2023-05-05 DIAGNOSIS — Z808 Family history of malignant neoplasm of other organs or systems: Secondary | ICD-10-CM

## 2023-05-05 DIAGNOSIS — I10 Essential (primary) hypertension: Secondary | ICD-10-CM | POA: Diagnosis present

## 2023-05-05 DIAGNOSIS — A419 Sepsis, unspecified organism: Secondary | ICD-10-CM | POA: Diagnosis present

## 2023-05-05 DIAGNOSIS — I700 Atherosclerosis of aorta: Secondary | ICD-10-CM | POA: Diagnosis present

## 2023-05-05 DIAGNOSIS — Z9481 Bone marrow transplant status: Secondary | ICD-10-CM | POA: Diagnosis not present

## 2023-05-05 DIAGNOSIS — C9000 Multiple myeloma not having achieved remission: Secondary | ICD-10-CM | POA: Diagnosis present

## 2023-05-05 DIAGNOSIS — Z9484 Stem cells transplant status: Secondary | ICD-10-CM | POA: Diagnosis not present

## 2023-05-05 DIAGNOSIS — K4090 Unilateral inguinal hernia, without obstruction or gangrene, not specified as recurrent: Secondary | ICD-10-CM | POA: Diagnosis present

## 2023-05-05 DIAGNOSIS — D61818 Other pancytopenia: Secondary | ICD-10-CM | POA: Diagnosis present

## 2023-05-05 DIAGNOSIS — R0603 Acute respiratory distress: Secondary | ICD-10-CM | POA: Diagnosis present

## 2023-05-05 DIAGNOSIS — E039 Hypothyroidism, unspecified: Secondary | ICD-10-CM | POA: Diagnosis present

## 2023-05-05 DIAGNOSIS — A4152 Sepsis due to Pseudomonas: Principal | ICD-10-CM | POA: Diagnosis present

## 2023-05-05 DIAGNOSIS — R1310 Dysphagia, unspecified: Secondary | ICD-10-CM | POA: Diagnosis present

## 2023-05-05 DIAGNOSIS — Z825 Family history of asthma and other chronic lower respiratory diseases: Secondary | ICD-10-CM

## 2023-05-05 DIAGNOSIS — E785 Hyperlipidemia, unspecified: Secondary | ICD-10-CM | POA: Diagnosis present

## 2023-05-05 DIAGNOSIS — Z823 Family history of stroke: Secondary | ICD-10-CM | POA: Diagnosis not present

## 2023-05-05 DIAGNOSIS — F32A Depression, unspecified: Secondary | ICD-10-CM | POA: Diagnosis present

## 2023-05-05 DIAGNOSIS — R5381 Other malaise: Secondary | ICD-10-CM | POA: Diagnosis present

## 2023-05-05 DIAGNOSIS — Z833 Family history of diabetes mellitus: Secondary | ICD-10-CM

## 2023-05-05 DIAGNOSIS — Z87891 Personal history of nicotine dependence: Secondary | ICD-10-CM

## 2023-05-05 DIAGNOSIS — R0902 Hypoxemia: Principal | ICD-10-CM

## 2023-05-05 DIAGNOSIS — Z79899 Other long term (current) drug therapy: Secondary | ICD-10-CM | POA: Diagnosis not present

## 2023-05-05 DIAGNOSIS — J9811 Atelectasis: Secondary | ICD-10-CM | POA: Diagnosis present

## 2023-05-05 DIAGNOSIS — Z881 Allergy status to other antibiotic agents status: Secondary | ICD-10-CM | POA: Diagnosis not present

## 2023-05-05 DIAGNOSIS — G9341 Metabolic encephalopathy: Secondary | ICD-10-CM | POA: Diagnosis present

## 2023-05-05 DIAGNOSIS — Z79890 Hormone replacement therapy (postmenopausal): Secondary | ICD-10-CM | POA: Diagnosis not present

## 2023-05-05 DIAGNOSIS — R4189 Other symptoms and signs involving cognitive functions and awareness: Secondary | ICD-10-CM | POA: Diagnosis present

## 2023-05-05 DIAGNOSIS — D72822 Plasmacytosis: Secondary | ICD-10-CM | POA: Diagnosis present

## 2023-05-05 DIAGNOSIS — J690 Pneumonitis due to inhalation of food and vomit: Secondary | ICD-10-CM | POA: Diagnosis present

## 2023-05-05 DIAGNOSIS — Z8249 Family history of ischemic heart disease and other diseases of the circulatory system: Secondary | ICD-10-CM | POA: Diagnosis not present

## 2023-05-05 DIAGNOSIS — K219 Gastro-esophageal reflux disease without esophagitis: Secondary | ICD-10-CM | POA: Diagnosis present

## 2023-05-05 DIAGNOSIS — Z66 Do not resuscitate: Secondary | ICD-10-CM | POA: Diagnosis present

## 2023-05-05 DIAGNOSIS — Z800 Family history of malignant neoplasm of digestive organs: Secondary | ICD-10-CM

## 2023-05-05 DIAGNOSIS — K760 Fatty (change of) liver, not elsewhere classified: Secondary | ICD-10-CM | POA: Diagnosis present

## 2023-05-05 DIAGNOSIS — Z888 Allergy status to other drugs, medicaments and biological substances status: Secondary | ICD-10-CM

## 2023-05-05 LAB — CBC WITH DIFFERENTIAL/PLATELET
Abs Immature Granulocytes: 0.05 10*3/uL (ref 0.00–0.07)
Basophils Absolute: 0 10*3/uL (ref 0.0–0.1)
Basophils Relative: 0 %
Eosinophils Absolute: 0 10*3/uL (ref 0.0–0.5)
Eosinophils Relative: 1 %
HCT: 29.2 % — ABNORMAL LOW (ref 39.0–52.0)
Hemoglobin: 9.5 g/dL — ABNORMAL LOW (ref 13.0–17.0)
Immature Granulocytes: 1 %
Lymphocytes Relative: 5 %
Lymphs Abs: 0.3 10*3/uL — ABNORMAL LOW (ref 0.7–4.0)
MCH: 39.3 pg — ABNORMAL HIGH (ref 26.0–34.0)
MCHC: 32.5 g/dL (ref 30.0–36.0)
MCV: 120.7 fL — ABNORMAL HIGH (ref 80.0–100.0)
Monocytes Absolute: 0.6 10*3/uL (ref 0.1–1.0)
Monocytes Relative: 12 %
Neutro Abs: 4.4 10*3/uL (ref 1.7–7.7)
Neutrophils Relative %: 81 %
Platelets: 37 10*3/uL — ABNORMAL LOW (ref 150–400)
RBC: 2.42 MIL/uL — ABNORMAL LOW (ref 4.22–5.81)
RDW: 20.1 % — ABNORMAL HIGH (ref 11.5–15.5)
Smear Review: DECREASED
WBC: 5.3 10*3/uL (ref 4.0–10.5)
nRBC: 0 % (ref 0.0–0.2)

## 2023-05-05 LAB — COMPREHENSIVE METABOLIC PANEL
ALT: 29 U/L (ref 0–44)
AST: 35 U/L (ref 15–41)
Albumin: 3.7 g/dL (ref 3.5–5.0)
Alkaline Phosphatase: 87 U/L (ref 38–126)
Anion gap: 13 (ref 5–15)
BUN: 21 mg/dL (ref 8–23)
CO2: 24 mmol/L (ref 22–32)
Calcium: 9.4 mg/dL (ref 8.9–10.3)
Chloride: 101 mmol/L (ref 98–111)
Creatinine, Ser: 1.08 mg/dL (ref 0.61–1.24)
GFR, Estimated: >60 mL/min (ref >60)
Glucose, Bld: 106 mg/dL — ABNORMAL HIGH (ref 70–99)
Potassium: 4.5 mmol/L (ref 3.5–5.1)
Sodium: 138 mmol/L (ref 135–145)
Total Bilirubin: 0.5 mg/dL (ref 0.3–1.2)
Total Protein: 6.3 g/dL — ABNORMAL LOW (ref 6.5–8.1)

## 2023-05-05 LAB — LACTIC ACID, PLASMA: Lactic Acid, Venous: 1.7 mmol/L (ref 0.5–1.9)

## 2023-05-05 LAB — BRAIN NATRIURETIC PEPTIDE: B Natriuretic Peptide: 116.7 pg/mL — ABNORMAL HIGH (ref 0.0–100.0)

## 2023-05-05 LAB — LIPASE, BLOOD: Lipase: 30 U/L (ref 11–51)

## 2023-05-05 LAB — TROPONIN I (HIGH SENSITIVITY)
Troponin I (High Sensitivity): 9 ng/L (ref <18)
Troponin I (High Sensitivity): 13 ng/L (ref <18)

## 2023-05-05 IMAGING — DX DG BONE SURVEY MET
9 of 10 series · 9 of 10 positions shown · non-contrast
Comparison: Bone survey 04/05/2020

CLINICAL DATA: Multiple myeloma

EXAM:
METASTATIC BONE SURVEY

[skull lat]
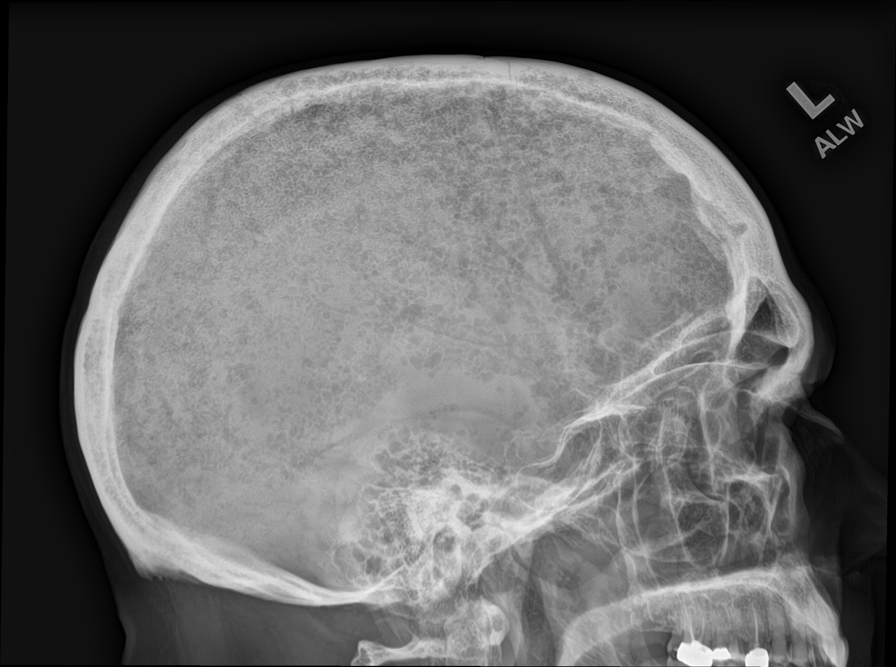

[shoulder ap (1 of 2)]
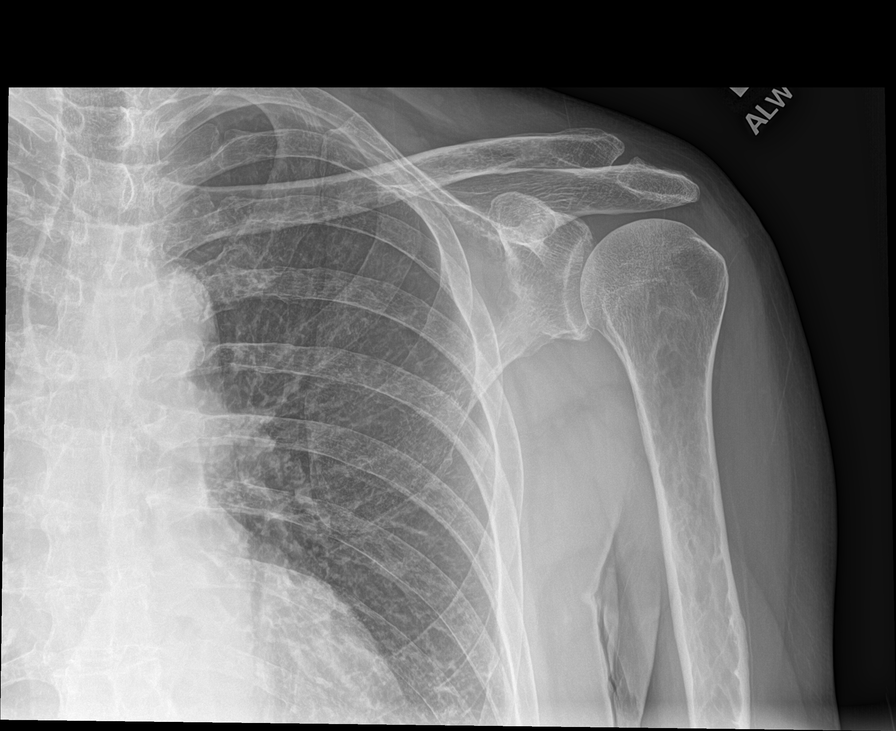

[shoulder ap (2 of 2)]
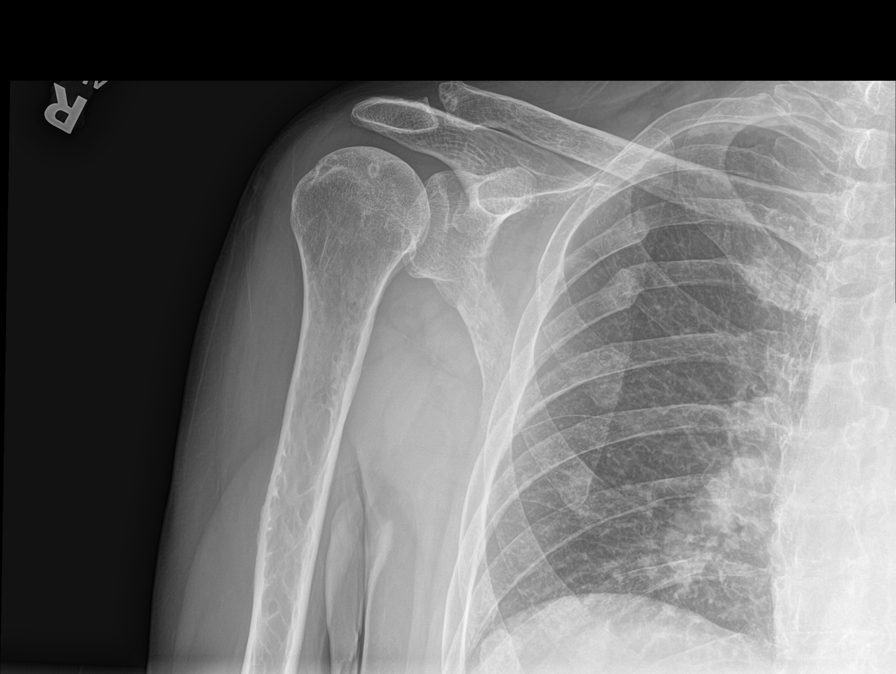

[humerus ap (1 of 2)]
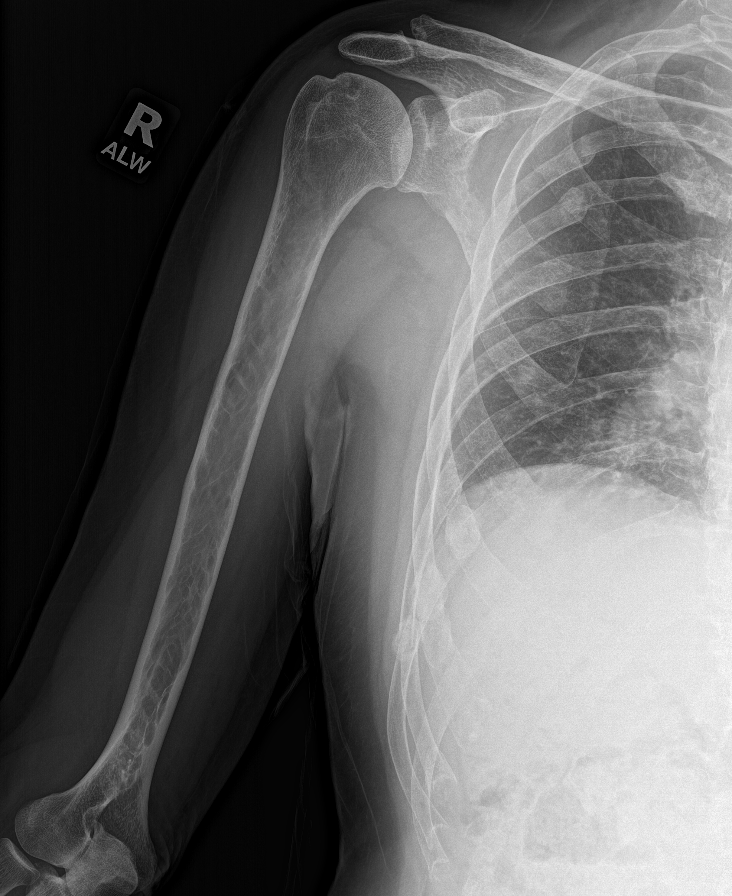

[humerus ap (2 of 2)]
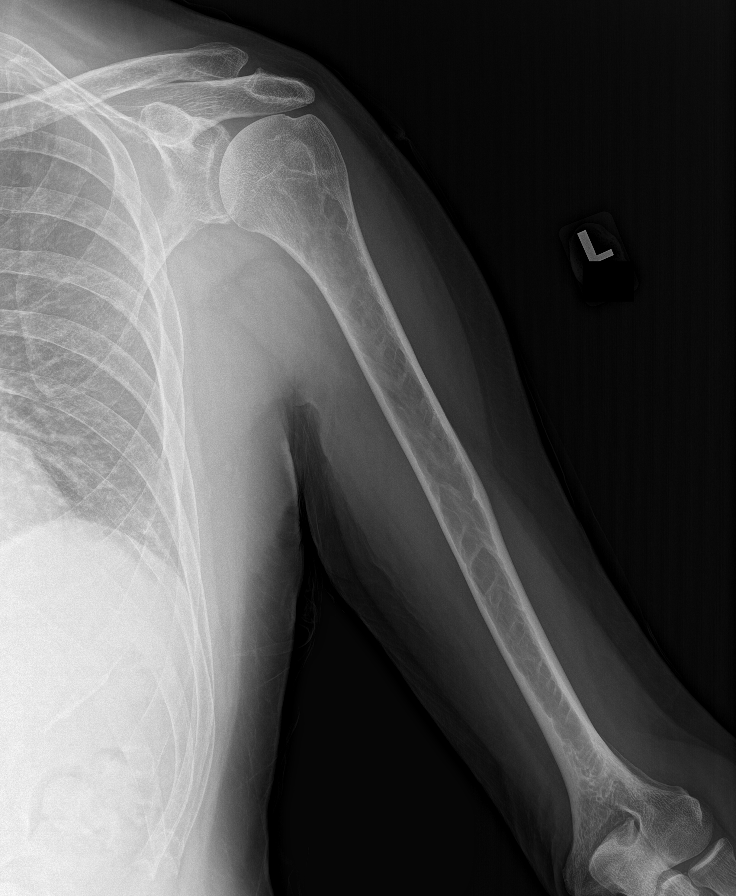

[forearm ap (1 of 2)]
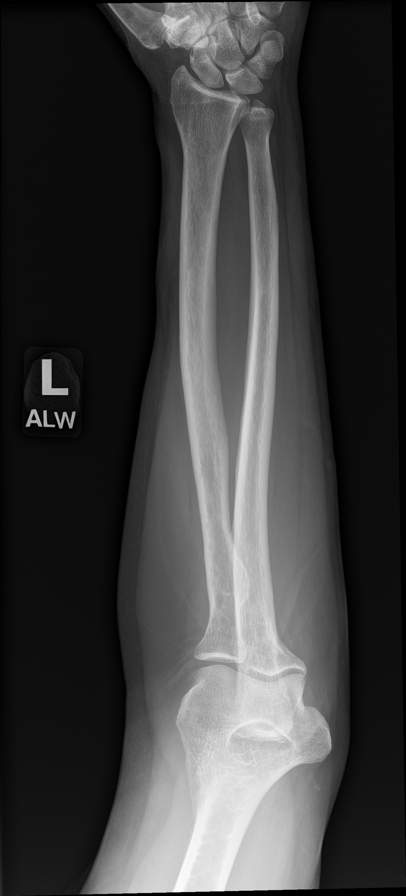

[forearm ap (2 of 2)]
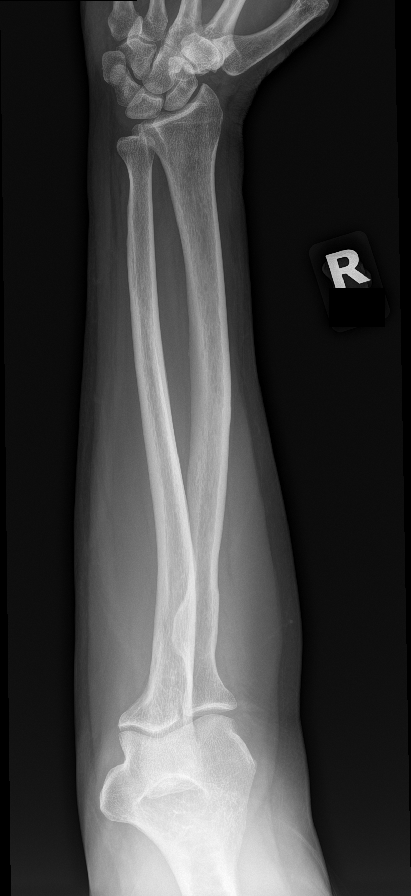

[c-spine lat]
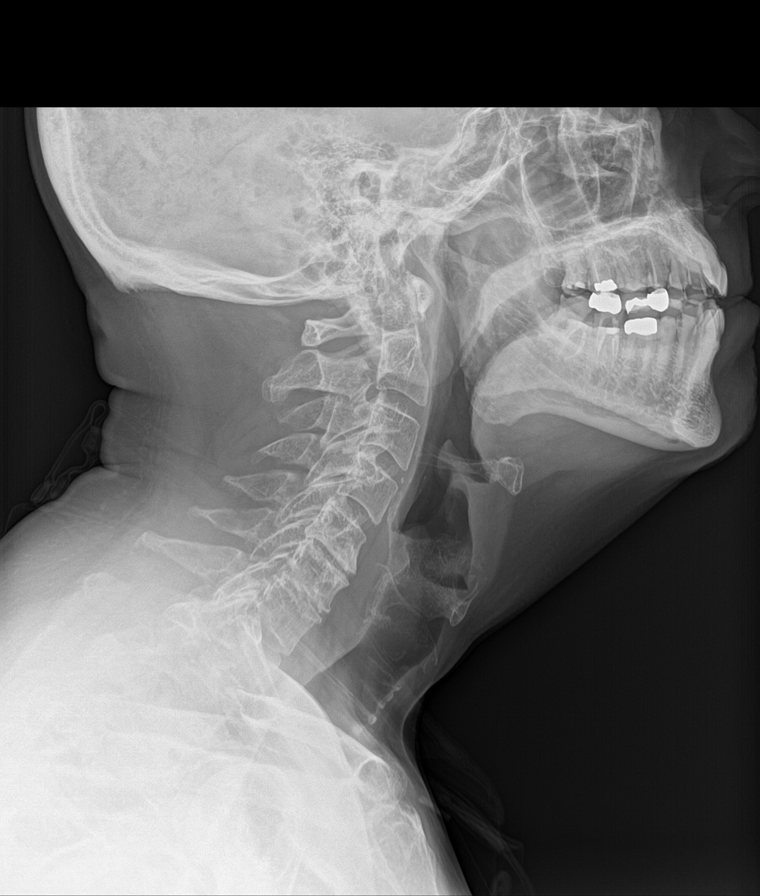

[c-spine ap]
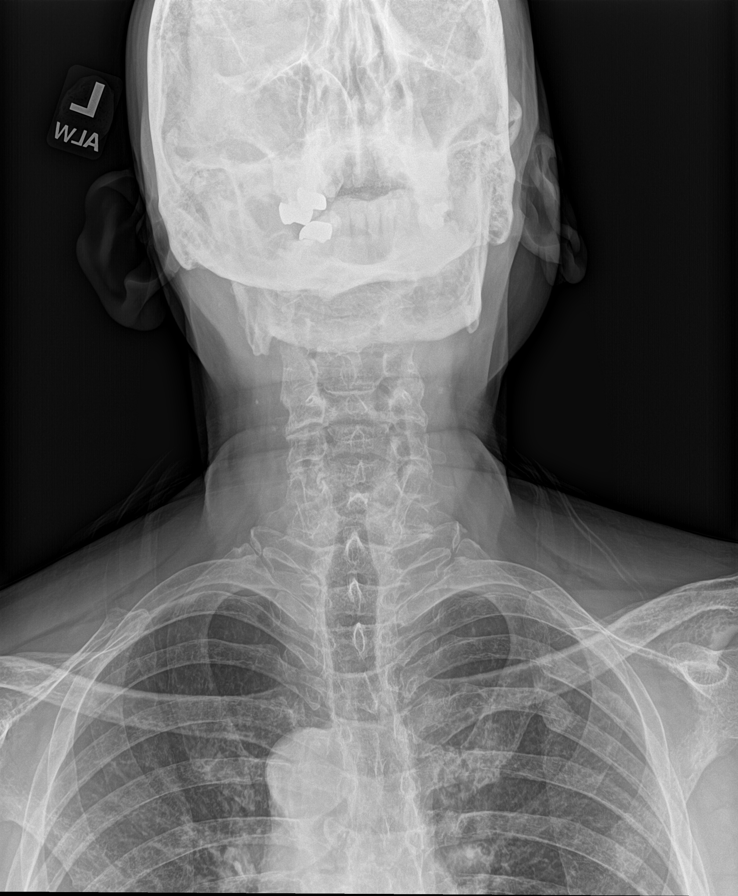

[9 of 10 positions shown; findings below may reference images not displayed]

FINDINGS: Innumerable small lytic lesions in the calvarium unchanged.

Cervical spondylosis. Lumbar scoliosis and spondylosis. Chronic
compression fracture T12 unchanged. No new fracture in the spine

Extensive lytic skeletal lesions in the humerus bilaterally
unchanged. Lytic lesions in the femur bilaterally unchanged. No
lesions in the pelvis. No new lytic lesion. Chronic right rib
fractures unchanged.

Lungs are clear without infiltrate or mass.
IMPRESSION: Widespread lytic lesions in the skeleton compatible with multiple
myeloma, unchanged from 1 year ago.

## 2023-05-05 MED ORDER — DOCUSATE SODIUM 100 MG PO CAPS: 100.0000 mg | ORAL_CAPSULE | Freq: Two times a day (BID) | ORAL | Status: DC | PRN

## 2023-05-05 MED ORDER — LACTATED RINGERS IV BOLUS
1000.0000 mL | Freq: Once | INTRAVENOUS | Status: AC
  Administered 2023-05-05: 1000 mL via INTRAVENOUS

## 2023-05-05 MED ORDER — ALBUTEROL SULFATE (2.5 MG/3ML) 0.083% IN NEBU
2.5000 mg | INHALATION_SOLUTION | RESPIRATORY_TRACT | Status: DC
  Administered 2023-05-06 (×3): 2.5 mg via RESPIRATORY_TRACT
  Filled 2023-05-05 (×3): qty 3

## 2023-05-05 MED ORDER — HEPARIN SODIUM (PORCINE) 5000 UNIT/ML IJ SOLN
5000.0000 [IU] | Freq: Three times a day (TID) | INTRAMUSCULAR | Status: DC
  Administered 2023-05-06 – 2023-05-07 (×4): 5000 [IU] via SUBCUTANEOUS
  Filled 2023-05-05 (×5): qty 1

## 2023-05-05 MED ORDER — LACTATED RINGERS IV SOLN: INTRAVENOUS | Status: DC

## 2023-05-05 MED ORDER — METHYLPREDNISOLONE SODIUM SUCC 40 MG IJ SOLR
40.0000 mg | Freq: Two times a day (BID) | INTRAMUSCULAR | Status: DC
  Administered 2023-05-06: 40 mg via INTRAVENOUS
  Filled 2023-05-05: qty 1

## 2023-05-05 MED ORDER — POLYETHYLENE GLYCOL 3350 17 G PO PACK: 17.0000 g | PACK | Freq: Every day | ORAL | Status: DC | PRN

## 2023-05-05 MED ORDER — SODIUM CHLORIDE 0.9 % IV SOLN
2.0000 g | Freq: Once | INTRAVENOUS | Status: AC
  Administered 2023-05-05: 2 g via INTRAVENOUS
  Filled 2023-05-05: qty 12.5

## 2023-05-05 MED ORDER — ALBUTEROL SULFATE (2.5 MG/3ML) 0.083% IN NEBU
2.5000 mg | INHALATION_SOLUTION | RESPIRATORY_TRACT | Status: DC | PRN
  Administered 2023-05-06: 2.5 mg via RESPIRATORY_TRACT
  Filled 2023-05-05 (×2): qty 3

## 2023-05-05 MED ORDER — ONDANSETRON HCL 4 MG/2ML IJ SOLN
4.0000 mg | Freq: Once | INTRAMUSCULAR | Status: AC
  Administered 2023-05-05: 4 mg via INTRAVENOUS
  Filled 2023-05-05: qty 2

## 2023-05-05 MED ORDER — VANCOMYCIN HCL 2000 MG/400ML IV SOLN
2000.0000 mg | Freq: Once | INTRAVENOUS | Status: AC
  Administered 2023-05-06: 2000 mg via INTRAVENOUS
  Filled 2023-05-05 (×2): qty 400

## 2023-05-05 NOTE — ED Triage Notes (Signed)
Pt coming in from nursing home. EMS states that first responding medic found pt at nursing home with staff holding towel over his face, vomiting. Staff says they found him lying on the bathroom floor. Staff manually cleared his airway, pt still difficulty breathing. Nursing home staff says he was normal around noon.    EMS reported vital signs: 174/90 100s hr  96-100 on nrb  16 LAC

## 2023-05-05 NOTE — ED Notes (Signed)
Report called to floor

## 2023-05-05 NOTE — Progress Notes (Signed)
ED Pharmacy Antibiotic Sign Off An antibiotic consult was received from an ED provider for vancomycin per pharmacy dosing for sepsis. A chart review was completed to assess appropriateness.   The following one time order(s) were placed:   Cefepime 2g IV x1 per MD Vancomycin 2g IV x1  Further antibiotic and/or antibiotic pharmacy consults should be ordered by the admitting provider if indicated.   Thank you for allowing pharmacy to be a part of this patient's care.   Arabella Merles, [facility]  Clinical Pharmacist 05/05/23 9:53 PM

## 2023-05-05 NOTE — H&P (Signed)
NAME:  Andre [name], MRN:  [id number], DOB:  [birth year], LOS: 0 ADMISSION DATE:  05/05/2023 CONSULTATION DATE: 05/05/2023 REFERRING MD: Dr. Doran Durand CHIEF COMPLAINT: Hypoxemic respiratory failure  History of Present Illness:  Patient was admitted from skilled nursing facility Found down in his bed with gurgling respirations.  According to spouse, he was fine earlier today with no significant concerns and she was called in about his respiratory status after he was found down in his bed. He had had emesis and was suctioned for large amount of gastric contents He was hypoxemic, brought in by EMS  Patient is DNR, desires intubation if needed  History of multiple myeloma, stem cell transplant CAR-T therapy in January-slow response to treatment  Limited with activities of daily living, ambulates with a walker, some memory issues recently for which he was following with neurology   Pertinent Medical History:   Past Medical History:  Diagnosis Date   Allergy    Anemia    Arthritis    Asthma    no treatment x 20 years   Depression    Double vision    occurs at times    Duodenal ulcer    GERD (gastroesophageal reflux disease)    Hyperlipidemia    Hypertension    Hypothyroidism    Multiple myeloma 07/04/2011   Thyroid disease    Significant Hospital Events: Including procedures, antibiotic start and stop dates in addition to other pertinent events   8/17 CT chest abdomen and pelvis:IMPRESSION:1. Substantially motion degraded scan, limiting assessment. No acute abnormality in the chest, abdomen or pelvis.2. Chronic small right inguinal hernia containing a small bowel loop, with no evidence of acute bowel complication. No evidence of bowel obstruction or acute bowel inflammation. Mild sigmoid diverticulosis. 3. Prominent eventration of the right hemidiaphragm with moderate passive right lung base atelectasis. 4. Large hiatal hernia with moderate compressive atelectasis in the medial  left lower lobe. 5. Three-vessel coronary atherosclerosis.6. Mild diffuse hepatic steatosis. 7. Cholelithiasis. 8. Chronic patchy lucencies throughout the visualized osseous structures, compatible with reported history of multiple myeloma. No new focal osseous lesions. Chronic mild superior T12 vertebral compression fracture. 9.  Aortic Atherosclerosis (ICD10-I70.0). 8/17-blood cultures 8/17-received vancomycin and cefepime  Interim History / Subjective:  Somnolent, arousable Not answering questions, not following commands Spouse at bedside who provided history  Objective:  Blood pressure (!) 140/82, pulse (!) 116, temperature (!) 102.8 F (39.3 C), temperature source Axillary, resp. rate (!) 32, SpO2 97%.        Intake/Output Summary (Last 24 hours) at 05/05/2023 2237 Last data filed at 05/05/2023 2126 Gross per 24 hour  Intake 1000 ml  Output --  Net 1000 ml   There were no vitals filed for this visit.  Physical Examination: General: Acutely ill-appearing, mild tachypnea HEENT: [place]/AT, anicteric sclera, PERRL, moist mucous membranes. Neuro: Lethargic. Responds to verbal stimuli. Not following commands. Moves all 4 extremities spontaneously. Strength poor in extremities. +Cough and +Gag  CV: RRR, no m/g/r. PULM: Tachypneic. Lung fields some rhonchi. GI: Soft, nontender, nondistended. Normoactive bowel sounds. Extremities: No significant LE edema noted. Skin: Warm/dry, .  Resolved Hospital Problem List:    Assessment & Plan:  Hypoxemic respiratory failure secondary to aspiration pneumonia -Continue oxygen supplementation and wean as tolerated -Will keep n.p.o. at the present time until mental status improves -Bronchodilator treatment  Aspiration pneumonia -Start on Zosyn -Add Solu-Medrol  History of multiple myeloma Receives immunoglobulin infusions -Post stem cell transplant -CAR-T in january with [facility] -His local  oncologist is Dr. Sherre Lain -Consider consulting  heme-onc  Pancytopenia Thrombocytopenia -Bleeding -Continue to monitor  Recent cognitive impairment  Patient is DNR According to spouse-open to intubation if needed is the family's consensus No CPR  Will need to address home medications and n.p.o. status once mental status improves  Best Practice: (right click and "Reselect all SmartList Selections" daily)   Diet/type: NPO DVT prophylaxis: prophylactic heparin  GI prophylaxis: N/A Lines: N/A Foley:  N/A Code Status:  DNR Last date of multidisciplinary goals of care discussion [discussed with spouse at bedside]  Labs:  CBC: Recent Labs  Lab 05/05/23 1744  WBC 5.3  NEUTROABS 4.4  HGB 9.5*  HCT 29.2*  MCV 120.7*  PLT 37*    Basic Metabolic Panel: Recent Labs  Lab 05/05/23 1901  NA 138  K 4.5  CL 101  CO2 24  GLUCOSE 106*  BUN 21  CREATININE 1.08  CALCIUM 9.4   GFR: Estimated Creatinine Clearance: 62.2 mL/min (by C-G formula based on SCr of 1.08 mg/dL). Recent Labs  Lab 05/05/23 1744 05/05/23 1901  WBC 5.3  --   LATICACIDVEN  --  1.7    Liver Function Tests: Recent Labs  Lab 05/05/23 1901  AST 35  ALT 29  ALKPHOS 87  BILITOT 0.5  PROT 6.3*  ALBUMIN 3.7   Recent Labs  Lab 05/05/23 1901  LIPASE 30   No results for input(s): "AMMONIA" in the last 168 hours.  ABG:    Component Value Date/Time   TCO2 24 05/02/2010 1027     Coagulation Profile: No results for input(s): "INR", "PROTIME" in the last 168 hours.  Cardiac Enzymes: No results for input(s): "CKTOTAL", "CKMB", "CKMBINDEX", "TROPONINI" in the last 168 hours.  HbA1C: Hgb A1c MFr Bld  Date/Time Value Ref Range Status  03/27/2012 12:25 PM 5.4 <5.7 % Final    Comment:    (NOTE)                                                                       According to the ADA Clinical Practice Recommendations for 2011, when HbA1c is used as a screening test:  >=6.5%   Diagnostic of Diabetes Mellitus           (if abnormal result is  confirmed) 5.7-6.4%   Increased risk of developing Diabetes Mellitus References:Diagnosis and Classification of Diabetes Mellitus,Diabetes Care,2011,34(Suppl 1):S62-S69 and Standards of Medical Care in         Diabetes - 2011,Diabetes Care,2011,34 (Suppl 1):S11-S61.    CBG: No results for input(s): "GLUCAP" in the last 168 hours.  Review of Systems:   somnolent  Past Medical History:  He,  has a past medical history of Allergy, Anemia, Arthritis, Asthma, Depression, Double vision, Duodenal ulcer, GERD (gastroesophageal reflux disease), Hyperlipidemia, Hypertension, Hypothyroidism, Multiple myeloma (07/04/2011), and Thyroid disease.   Surgical History:   Past Surgical History:  Procedure Laterality Date   BONE MARROW TRANSPLANT  2011   for MM   CARDIOLITE STUDY  11/25/2003   NORMAL   TONSILLECTOMY       Social History:   reports that he quit smoking about 54 years ago. His smoking use included cigarettes. He started smoking about 57 years ago. He has never used smokeless  tobacco. He reports that he does not drink alcohol and does not use drugs.   Family History:  His family history includes Asthma in his father; Diabetes in his father; Hypertension in his father; Pancreatic cancer in his brother; Stroke in his father; Throat cancer in his mother.   Allergies: Allergies  Allergen Reactions   Atorvastatin Other (See Comments)   Rosuvastatin     Other reaction(s): Liver Disorder   Crestor [Rosuvastatin Calcium]     ADVERSE EFFECTS ON LIVER   Lipitor [Atorvastatin Calcium]    Septra [Sulfamethoxazole-Trimethoprim] Rash     Home Medications: Prior to Admission medications   Medication Sig Start Date End Date Taking? Authorizing Provider  acetaminophen (TYLENOL) 650 MG CR tablet Take 650 mg by mouth every 6 (six) hours as needed for pain.    [provider]  calcium carbonate (TUMS EX) 750 MG chewable tablet Chew 1 tablet by mouth daily.    [provider]   calcium-vitamin D (OSCAL WITH D) 500-200 MG-UNIT tablet Take 1 tablet by mouth daily. 08/14/22   [name], Georgina Quint, MD  Cholecalciferol (VITAMIN D3) 25 MCG (1000 UT) capsule Take 1,000 Units by mouth daily.    [provider]  cyanocobalamin (VITAMIN B12) 1000 MCG tablet Take 1 tablet (1,000 mcg total) by mouth daily. 04/09/23   Rana Snare, NP  dapsone 100 MG tablet Take 100 mg by mouth daily.    Ladene Artist, MD  docusate sodium (COLACE) 100 MG capsule Take 100 mg by mouth daily as needed for mild constipation or moderate constipation.    [provider]  escitalopram (LEXAPRO) 10 MG tablet Take 1 tablet (10 mg total) by mouth daily. 06/27/22   [name], Georgina Quint, MD  ferrous sulfate 325 (65 FE) MG EC tablet Take 325 mg by mouth 2 (two) times daily.    [provider]  folic acid (FOLVITE) 1 MG tablet Take 1 tablet (1 mg total) by mouth daily. 04/09/23   Rana Snare, NP  levothyroxine (SYNTHROID) 125 MCG tablet Take 125 mcg by mouth daily. 04/12/23   [provider]  methylphenidate (RITALIN) 5 MG tablet Take 0.5 tablets (2.5 mg total) by mouth daily. Each morning 04/05/23   Fletcher Anon, NP  pantoprazole (PROTONIX) 40 MG tablet Take 1 tablet (40 mg total) by mouth daily. 04/20/23   Fletcher Anon, NP  triamcinolone ointment (KENALOG) 0.1 % Apply to affected area topically 2 (two) times daily. 08/16/22   [name], Georgina Quint, MD  valACYclovir (VALTREX) 500 MG tablet Take 500 mg by mouth daily.    [provider]  prochlorperazine (COMPAZINE) 10 MG tablet Take 1 tablet (10 mg total) by mouth every 6 (six) hours as needed (Nausea or vomiting). 12/14/20 12/12/21  Magrinat, Valentino Hue, MD    The patient is critically ill with multiple organ systems failure and requires high complexity decision making for assessment and support, frequent evaluation and titration of therapies, application of advanced monitoring technologies and extensive  interpretation of multiple databases. Critical Care Time devoted to patient care services described in this note independent of APP/resident time (if applicable)  is 31 minutes.   Virl Diamond MD [facility] Personal pager: See Amion If unanswered, please page CCM On-call: #[phone]

## 2023-05-05 NOTE — Progress Notes (Signed)
Pharmacy Antibiotic Note  Andre [name] is a 72 y.o. male admitted on 05/05/2023 with  aspiration PNA .  Pharmacy has been consulted for zosyn dosing.  Presented with concern of aspiration from SNF - WBC 5.3, LA 1.7. Tmax 102. Received 1 time dose of cefepime/vanc this evening. Scr 1.08 (CrCl 62 mL/min).   Plan: Zosyn 3.375g IV q8h (4 hour infusion). Monitor renal fx, cx results, clinical pic, and duration of therapy     Temp (24hrs), Avg:101.5 F (38.6 C), Min:99.8 F (37.7 C), Max:102.8 F (39.3 C)  Recent Labs  Lab 05/05/23 1744 05/05/23 1901  WBC 5.3  --   CREATININE  --  1.08  LATICACIDVEN  --  1.7    Estimated Creatinine Clearance: 62.2 mL/min (by C-G formula based on SCr of 1.08 mg/dL).    Allergies  Allergen Reactions   Atorvastatin Other (See Comments)   Rosuvastatin     Other reaction(s): Liver Disorder   Crestor [Rosuvastatin Calcium]     ADVERSE EFFECTS ON LIVER   Lipitor [Atorvastatin Calcium]    Septra [Sulfamethoxazole-Trimethoprim] Rash    Antimicrobials this admission: Cefepime/vancomycin 8/17 x1 Zosyn 8/17 >>   Dose adjustments this admission: N/A  Microbiology results: 8/17 BCx: sent  Thank you for allowing pharmacy to participate in this patient's care,  Sherron Monday, PharmD, BCCCP Clinical Pharmacist  Phone: [phone] 05/05/2023 11:32 PM  Please check AMION for all [facility] Pharmacy phone numbers After 10:00 PM, call Main Pharmacy [phone]

## 2023-05-05 NOTE — ED Provider Notes (Addendum)
[place] EMERGENCY DEPARTMENT AT [facility] Provider Note   CSN: [id number] Arrival date & time: 05/05/23  1709     History Chief Complaint  Patient presents with   Respiratory Distress    HPI Andre [name] is a 72 y.o. male presenting for chief complaint of aspiration event.  He is a 72 year old male extensive medical history sent in from skilled nursing facility for altered mental status after being found down in the bed.  He was with a towel covering his face significant vomitus material in his mouth.  Severe desaturation event with EMS and transfer on nonrebreather on arrival. DNR with patient.   Patient's recorded medical, surgical, social, medication list and allergies were reviewed in the Snapshot window as part of the initial history.   Review of Systems   Review of Systems  Constitutional:  Negative for chills and fever.  HENT:  Negative for ear pain and sore throat.   Eyes:  Negative for pain and visual disturbance.  Respiratory:  Positive for shortness of breath. Negative for cough.   Cardiovascular:  Negative for chest pain and palpitations.  Gastrointestinal:  Negative for abdominal pain and vomiting.  Genitourinary:  Negative for dysuria and hematuria.  Musculoskeletal:  Negative for arthralgias and back pain.  Skin:  Negative for color change and rash.  Neurological:  Negative for seizures and syncope.  All other systems reviewed and are negative.   Physical Exam Updated Vital Signs BP (!) 159/77   Pulse (!) 114   Temp (!) 102 F (38.9 C) (Axillary)   Resp (!) 30   SpO2 98%  Physical Exam Vitals and nursing note reviewed.  Constitutional:      General: He is not in acute distress.    Appearance: He is well-developed.  HENT:     Head: Normocephalic and atraumatic.  Eyes:     Conjunctiva/sclera: Conjunctivae normal.  Cardiovascular:     Rate and Rhythm: Normal rate and regular rhythm.     Heart sounds: No murmur heard. Pulmonary:      Effort: Respiratory distress present.     Breath sounds: Rhonchi present.  Abdominal:     Palpations: Abdomen is soft.     Tenderness: There is no abdominal tenderness.  Musculoskeletal:        General: No swelling.     Cervical back: Neck supple.  Skin:    General: Skin is warm and dry.     Capillary Refill: Capillary refill takes less than 2 seconds.  Neurological:     Mental Status: He is alert.  Psychiatric:        Mood and Affect: Mood normal.      ED Course/ Medical Decision Making/ A&P    Procedures .Critical Care  Performed by: Glyn Ade, MD Authorized by: Glyn Ade, MD   Critical care provider statement:    Critical care time (minutes):  75   Critical care was necessary to treat or prevent imminent or life-threatening deterioration of the following conditions:  Respiratory failure   Critical care was time spent personally by me on the following activities:  Development of treatment plan with patient or surrogate, discussions with consultants, evaluation of patient's response to treatment, examination of patient, ordering and review of laboratory studies, ordering and review of radiographic studies, ordering and performing treatments and interventions, pulse oximetry, re-evaluation of patient's condition and review of old charts    Medications Ordered in ED Medications  vancomycin (VANCOREADY) IVPB 2000 mg/400 mL (has no  administration in time range)  ondansetron (ZOFRAN) injection 4 mg (4 mg Intravenous Given 05/05/23 1728)  lactated ringers bolus 1,000 mL (0 mLs Intravenous Stopped 05/05/23 2126)  ceFEPIme (MAXIPIME) 2 g in sodium chloride 0.9 % 100 mL IVPB (0 g Intravenous Stopped 05/05/23 2242)  lactated ringers bolus 1,000 mL (0 mLs Intravenous Stopped 05/05/23 2252)    Medical Decision Making:    Andre [name] is a 72 y.o. male who presented to the ED today with a chief complaint of SOB detailed above.     Complete initial physical exam  performed, notably the patient  was a chief complaint of SOB.      Reviewed and confirmed nursing documentation for past medical history, family history, social history.    Initial Assessment:   With the patient's presentation of aspiration , most likely diagnosis is aspiration diagnoses were considered including (but not limited to) putting. These are considered less likely due to history of present illness and physical exam findings.   This is most consistent with an acute life/limb threatening illness complicated by underlying chronic conditions.  Initial Plan:  CT chest AP for sepsis evaluation  Screening labs including CBC and Metabolic panel to evaluate for infectious or metabolic etiology of disease.  Urinalysis with reflex culture ordered to evaluate for UTI or relevant urologic/nephrologic pathology.  CXR to evaluate for structural/infectious intrathoracic pathology.  EKG to evaluate for cardiac pathology. Objective evaluation as below reviewed with plan for close reassessment  Initial Study Results:   Laboratory  All laboratory results reviewed without evidence of clinically relevant pathology.    EKG EKG was reviewed independently. Rate, rhythm, axis, intervals all examined and without medically relevant abnormality. ST segments without concerns for elevations.    Radiology  All images reviewed independently. Agree with radiology report at this time.   CT CHEST ABDOMEN PELVIS WO CONTRAST  Result Date: 05/05/2023 CLINICAL DATA:  Sepsis.  Multiple myeloma.  * Tracking Code: BO * EXAM: CT CHEST, ABDOMEN AND PELVIS WITHOUT CONTRAST TECHNIQUE: Multidetector CT imaging of the chest, abdomen and pelvis was performed following the standard protocol without IV contrast. RADIATION DOSE REDUCTION: This exam was performed according to the departmental dose-optimization program which includes automated exposure control, adjustment of the mA and/or kV according to patient size and/or use of  iterative reconstruction technique. COMPARISON:  Chest radiograph from earlier today. 12/23/2015 chest CT. 11/07/2015 CT abdomen/pelvis. FINDINGS: CT CHEST FINDINGS Substantially motion degraded scan, limiting assessment. Cardiovascular: Normal heart size. No significant pericardial effusion/thickening. Three-vessel coronary atherosclerosis. Atherosclerotic nonaneurysmal thoracic aorta. Normal caliber pulmonary arteries. Mediastinum/Nodes: No significant thyroid nodules. Mildly patulous fluid-filled lower thoracic esophagus. No pathologically enlarged axillary, mediastinal or hilar lymph nodes, noting limited sensitivity for the detection of hilar adenopathy on this noncontrast study. Lungs/Pleura: No pneumothorax. No pleural effusion. Prominent eventration of the right hemidiaphragm with moderate passive right lung base atelectasis. Moderate compressive atelectasis in the medial left lower lobe from the large hiatal hernia. Otherwise no acute consolidative airspace disease, lung masses or gross pulmonary nodules on the motion degraded images. Musculoskeletal: Healed lateral right sixth through eighth rib fractures. Incompletely healed posterolateral left eighth and ninth rib fractures. Chronic patchy lucencies throughout visualized osseous structures, not appreciably changed. No new discrete lesions in the chest. Chronic mild superior T12 vertebral compression fracture, unchanged. CT ABDOMEN PELVIS FINDINGS Hepatobiliary: Mild diffuse hepatic steatosis. Normal liver size. No liver masses. Cholelithiasis. No gallbladder wall thickening or pericholecystic fluid. No biliary ductal dilatation. Pancreas: Normal, with no mass  or duct dilation. Spleen: Normal size. No mass. Adrenals/Urinary Tract: Normal adrenals. No hydronephrosis. No renal stones. Simple 1.5 cm lower left renal cyst, for which no follow-up imaging is recommended. No additional contour deforming renal lesions. Normal bladder. Stomach/Bowel: Large hiatal  hernia. Stomach is not significantly distended. No acute gastric abnormality on these noncontrast motion degraded images. Small right inguinal hernia contains a small bowel loop, chronic and unchanged, with no dilated or thick-walled small bowel loops. No focal small bowel caliber transition. No pneumatosis. Appendix not discretely visualized. Mild sigmoid diverticulosis with no large bowel wall thickening or significant pericolonic fat stranding. Vascular/Lymphatic: Atherosclerotic nonaneurysmal abdominal aorta. No pathologically enlarged lymph nodes in the abdomen or pelvis. Reproductive: Normal size prostate. Other: No pneumoperitoneum, ascites or focal fluid collection. Musculoskeletal: Mild patchy lucencies throughout the spine and pelvic girdle, chronic and not appreciably changed. No new focal osseous lesions. Moderate multilevel lumbar degenerative disc disease. IMPRESSION: 1. Substantially motion degraded scan, limiting assessment. No acute abnormality in the chest, abdomen or pelvis. 2. Chronic small right inguinal hernia containing a small bowel loop, with no evidence of acute bowel complication. No evidence of bowel obstruction or acute bowel inflammation. Mild sigmoid diverticulosis. 3. Prominent eventration of the right hemidiaphragm with moderate passive right lung base atelectasis. 4. Large hiatal hernia with moderate compressive atelectasis in the medial left lower lobe. 5. Three-vessel coronary atherosclerosis. 6. Mild diffuse hepatic steatosis. 7. Cholelithiasis. 8. Chronic patchy lucencies throughout the visualized osseous structures, compatible with reported history of multiple myeloma. No new focal osseous lesions. Chronic mild superior T12 vertebral compression fracture. 9.  Aortic Atherosclerosis (ICD10-I70.0). Electronically Signed   By: Delbert Phenix M.D.   On: 05/05/2023 19:20   DG Chest Portable 1 View  Result Date: 05/05/2023 CLINICAL DATA:  Dyspnea EXAM: PORTABLE CHEST 1 VIEW  COMPARISON:  01/11/2023 chest radiograph. FINDINGS: Right rotated chest radiograph. Stable mild cardiomegaly. Left retrocardiac lucency, suspect at least moderate hiatal hernia, unchanged. Stable mediastinal contour. No pneumothorax. No pleural effusion. Chronic prominent elevation of the right hemidiaphragm. No pulmonary edema. Mild bibasilar atelectasis. No acute consolidative airspace disease. IMPRESSION: 1. Stable mild cardiomegaly without pulmonary edema. 2. Chronic prominent elevation of the right hemidiaphragm with mild bibasilar atelectasis. 3. Stable left retrocardiac lucency, suspect at least moderate hiatal hernia. Electronically Signed   By: Delbert Phenix M.D.   On: 05/05/2023 17:43     Reassessment and Plan:   Patients HPI and PE findings are most consistent with aspiration PNA. Consulted CCM for admission due to inability to deescalate from NRB.   Disposition:   Based on the above findings, I believe this patient is stable for admission.    Patient/family educated about specific findings on our evaluation and explained exact reasons for admission.  Patient/family educated about clinical situation and time was allowed to answer questions.   Admission team communicated with and agreed with need for admission. Patient admitted. Patient  ready to move at this time.     Emergency Department Medication Summary:   Medications  vancomycin (VANCOREADY) IVPB 2000 mg/400 mL (has no administration in time range)  ondansetron (ZOFRAN) injection 4 mg (4 mg Intravenous Given 05/05/23 1728)  lactated ringers bolus 1,000 mL (0 mLs Intravenous Stopped 05/05/23 2126)  ceFEPIme (MAXIPIME) 2 g in sodium chloride 0.9 % 100 mL IVPB (0 g Intravenous Stopped 05/05/23 2242)  lactated ringers bolus 1,000 mL (0 mLs Intravenous Stopped 05/05/23 2252)         Clinical Impression:  1. Hypoxia  2. Respiratory distress      Data Unavailable   Final Clinical Impression(s) / ED Diagnoses Final diagnoses:   Hypoxia  Respiratory distress    Rx / DC Orders ED Discharge Orders     None         Glyn Ade, MD 05/05/23 2300    Glyn Ade, MD 05/05/23 2335

## 2023-05-05 NOTE — ED Notes (Signed)
Assumed care of pt here from care facility with hx of multiple myeloma sent by ems for resp distress and emesis. Patient DNR wife at bedside states last known well after lunch. Patient lethargic tachypnic  tachycardic htn febrile and all around ill appearing. Pt currently on non rebreather at 12 liters oxygen is pending CTA results and lab results

## 2023-05-06 ENCOUNTER — Inpatient Hospital Stay (HOSPITAL_COMMUNITY): Payer: Medicare Other

## 2023-05-06 DIAGNOSIS — J9601 Acute respiratory failure with hypoxia: Secondary | ICD-10-CM | POA: Diagnosis not present

## 2023-05-06 LAB — BLOOD CULTURE ID PANEL (REFLEXED) - BCID2

## 2023-05-06 LAB — CBC
HCT: 25.4 % — ABNORMAL LOW (ref 39.0–52.0)
Hemoglobin: 8.2 g/dL — ABNORMAL LOW (ref 13.0–17.0)
MCH: 37.4 pg — ABNORMAL HIGH (ref 26.0–34.0)
MCHC: 32.3 g/dL (ref 30.0–36.0)
MCV: 116 fL — ABNORMAL HIGH (ref 80.0–100.0)
Platelets: 26 10*3/uL — CL (ref 150–400)
RBC: 2.19 MIL/uL — ABNORMAL LOW (ref 4.22–5.81)
RDW: 19.9 % — ABNORMAL HIGH (ref 11.5–15.5)
WBC: 7.7 10*3/uL (ref 4.0–10.5)
nRBC: 0 % (ref 0.0–0.2)

## 2023-05-06 LAB — GLUCOSE, CAPILLARY
Glucose-Capillary: 104 mg/dL — ABNORMAL HIGH (ref 70–99)
Glucose-Capillary: 114 mg/dL — ABNORMAL HIGH (ref 70–99)
Glucose-Capillary: 115 mg/dL — ABNORMAL HIGH (ref 70–99)
Glucose-Capillary: 119 mg/dL — ABNORMAL HIGH (ref 70–99)
Glucose-Capillary: 125 mg/dL — ABNORMAL HIGH (ref 70–99)
Glucose-Capillary: 133 mg/dL — ABNORMAL HIGH (ref 70–99)
Glucose-Capillary: 91 mg/dL (ref 70–99)

## 2023-05-06 LAB — MRSA NEXT GEN BY PCR, NASAL: MRSA by PCR Next Gen: NOT DETECTED

## 2023-05-06 LAB — LACTIC ACID, PLASMA: Lactic Acid, Venous: 0.9 mmol/L (ref 0.5–1.9)

## 2023-05-06 LAB — CREATININE, SERUM
Creatinine, Ser: 1.19 mg/dL (ref 0.61–1.24)
GFR, Estimated: >60 mL/min (ref >60)

## 2023-05-06 MED ORDER — SODIUM CHLORIDE 0.9 % IV SOLN
2.0000 g | Freq: Once | INTRAVENOUS | Status: AC
  Administered 2023-05-06: 2 g via INTRAVENOUS
  Filled 2023-05-06: qty 12.5

## 2023-05-06 MED ORDER — TRAZODONE HCL 100 MG PO TABS
100.0000 mg | ORAL_TABLET | Freq: Every evening | ORAL | Status: DC | PRN
  Administered 2023-05-09: 100 mg via ORAL
  Filled 2023-05-06 (×2): qty 1

## 2023-05-06 MED ORDER — VANCOMYCIN HCL 2000 MG/400ML IV SOLN: 2000.0000 mg | Freq: Once | INTRAVENOUS | Status: DC

## 2023-05-06 MED ORDER — INSULIN ASPART 100 UNIT/ML IJ SOLN: 0.0000 [IU] | INTRAMUSCULAR | Status: DC

## 2023-05-06 MED ORDER — SODIUM CHLORIDE 0.9 % IV SOLN
2.0000 g | Freq: Two times a day (BID) | INTRAVENOUS | Status: DC
  Administered 2023-05-06: 2 g via INTRAVENOUS
  Filled 2023-05-06: qty 12.5

## 2023-05-06 MED ORDER — MELATONIN 3 MG PO TABS
3.0000 mg | ORAL_TABLET | Freq: Every day | ORAL | Status: DC
  Administered 2023-05-06 – 2023-05-10 (×5): 3 mg via ORAL
  Filled 2023-05-06 (×5): qty 1

## 2023-05-06 MED ORDER — CHLORHEXIDINE GLUCONATE CLOTH 2 % EX PADS
6.0000 | MEDICATED_PAD | Freq: Every day | CUTANEOUS | Status: DC
  Administered 2023-05-06 – 2023-05-08 (×3): 6 via TOPICAL

## 2023-05-06 NOTE — Progress Notes (Signed)
PCCM:  Blood cx with pseudomonas  GNR bacteremia  Continue cefepime  Josephine Igo, DO [facility] 05/06/2023 2:24 PM

## 2023-05-06 NOTE — Progress Notes (Signed)
eLink Physician-Brief Progress Note Patient Name: [name] DOB: 03-26-1951 MRN: [id number]   Date of Service  05/06/2023  HPI/Events of Note  72 yr old NHR with hx of multiple myeloma with prior BMT and CAR-T therapy in January presented with altered mental status and hypoxia.  Now on NRB mask.  Lethargic but will arouse.  Felt to likely have pneumonia.  On abx and IVF.  eICU Interventions  Chart reviewed     Intervention Category Evaluation Type: New Patient Evaluation  Henry Russel, P 05/06/2023, 1:09 AM

## 2023-05-06 NOTE — Progress Notes (Signed)
eLink Physician-Brief Progress Note Patient Name: Andre [name] DOB: 11-16-1950 MRN: [id number]   Date of Service  05/06/2023  HPI/Events of Note  72 year old with multiple myeloma, previous BMT and CAR-T therapy initially presented with acute encephalopathy now found to have bacteremia with Pseudomonas.  Requesting something to help him sleep.  eICU Interventions  Scheduled melatonin, as needed trazodone second line.     Intervention Category Minor Interventions: Routine modifications to care plan (e.g. PRN medications for pain, fever)  Elanna Bert 05/06/2023, 10:04 PM

## 2023-05-06 NOTE — Progress Notes (Signed)
NAME:  Andre [name], MRN:  [id number], DOB:  1951-02-28, LOS: 1 ADMISSION DATE:  05/05/2023 CONSULTATION DATE: 05/05/2023 REFERRING MD: Dr. Doran Durand CHIEF COMPLAINT: Hypoxemic respiratory failure  History of Present Illness:  Patient was admitted from skilled nursing facility. Found down in his bed with gurgling respirations.  According to spouse, he was fine earlier today with no significant concerns and she was called in about his respiratory status after he was found down in his bed. He had had emesis and was suctioned for large amount of gastric contents. He was hypoxemic, brought in by EMS  Patient is DNR, desires intubation if needed. History of multiple myeloma, stem cell transplant CAR-T therapy in January-slow response to treatment  Limited with activities of daily living, ambulates with a walker, some memory issues recently for which he was following with neurology  Pertinent Medical History:   Past Medical History:  Diagnosis Date   Allergy    Anemia    Arthritis    Asthma    no treatment x 20 years   Depression    Double vision    occurs at times    Duodenal ulcer    GERD (gastroesophageal reflux disease)    Hyperlipidemia    Hypertension    Hypothyroidism    Multiple myeloma 07/04/2011   Thyroid disease    Significant Hospital Events: Including procedures, antibiotic start and stop dates in addition to other pertinent events   8/17 CT chest abdomen and pelvis:IMPRESSION:1. Substantially motion degraded scan, limiting assessment. No acute abnormality in the chest, abdomen or pelvis.2. Chronic small right inguinal hernia containing a small bowel loop, with no evidence of acute bowel complication. No evidence of bowel obstruction or acute bowel inflammation. Mild sigmoid diverticulosis. 3. Prominent eventration of the right hemidiaphragm with moderate passive right lung base atelectasis. 4. Large hiatal hernia with moderate compressive atelectasis in the medial left  lower lobe. 5. Three-vessel coronary atherosclerosis.6. Mild diffuse hepatic steatosis. 7. Cholelithiasis. 8. Chronic patchy lucencies throughout the visualized osseous structures, compatible with reported history of multiple myeloma. No new focal osseous lesions. Chronic mild superior T12 vertebral compression fracture. 9.  Aortic Atherosclerosis (ICD10-I70.0). 8/17-blood cultures 8/17-received vancomycin and cefepime  Interim History / Subjective:   Comfortable. Per nursing mentation has improved dramatically.   Objective:  Blood pressure 113/60, pulse 74, temperature 99.5 F (37.5 C), temperature source Oral, resp. rate 14, weight 82.2 kg, SpO2 93%.        Intake/Output Summary (Last 24 hours) at 05/06/2023 0724 Last data filed at 05/06/2023 0600 Gross per 24 hour  Intake 2939.41 ml  Output 1300 ml  Net 1639.41 ml   Filed Weights   05/06/23 0100  Weight: 82.2 kg    Physical Examination: General: elderly male, comfortable in bed  HEENT: NCAT tracking  Neuro: alert, following commands  CV: RRR, no mrg PULM: CTAB, no wheeze  GI: soft nt nd  Extremities: no edema  Skin: warm dry   Resolved Hospital Problem List:    Assessment & Plan:   Sepsis, fever, immunosuppressed patient  Hypoxemic respiratory failure secondary to aspiration pneumonia vs pneumonitis  CT with minimal basilar atelectasis  -Continue oxygen supplementation and wean as tolerated - advance diet? - continue IVF for now  - remains on cefepime and stop vanco, MRSA swab neg  - follow up on cx  - check urine cx  - stopped solumedrol dosing   History of multiple myeloma Receives immunoglobulin infusions -Post stem cell transplant -CAR-T in january  with [facility] -His local oncologist is Dr. Truett Perna - I will let Dr. Truett Perna know his patient is admitted   H/o Pancytopenia, Thrombocytopenia Anemia  - continue to monitor - related to malignancy  - and acute infection  Recent cognitive impairment -  observe for delirium risk or sundowning   Patient is DNR According to spouse-open to intubation if needed is the family's consensus No CPR   Best Practice: (right click and "Reselect all SmartList Selections" daily)   Diet/type: NPO DVT prophylaxis: prophylactic heparin  GI prophylaxis: N/A Lines: N/A Foley:  N/A Code Status:  DNR Last date of multidisciplinary goals of care discussion [discussed with spouse at bedside]  Labs:  CBC: Recent Labs  Lab 05/05/23 1744 05/06/23 0155  WBC 5.3 7.7  NEUTROABS 4.4  --   HGB 9.5* 8.2*  HCT 29.2* 25.4*  MCV 120.7* 116.0*  PLT 37* 26*    Basic Metabolic Panel: Recent Labs  Lab 05/05/23 1901 05/06/23 0155  NA 138  --   K 4.5  --   CL 101  --   CO2 24  --   GLUCOSE 106*  --   BUN 21  --   CREATININE 1.08 1.19  CALCIUM 9.4  --    GFR: Estimated Creatinine Clearance: 56.7 mL/min (by C-G formula based on SCr of 1.19 mg/dL). Recent Labs  Lab 05/05/23 1744 05/05/23 1901 05/06/23 0155 05/06/23 0156  WBC 5.3  --  7.7  --   LATICACIDVEN  --  1.7  --  0.9    Liver Function Tests: Recent Labs  Lab 05/05/23 1901  AST 35  ALT 29  ALKPHOS 87  BILITOT 0.5  PROT 6.3*  ALBUMIN 3.7   Recent Labs  Lab 05/05/23 1901  LIPASE 30   No results for input(s): "AMMONIA" in the last 168 hours.  ABG:    Component Value Date/Time   TCO2 24 05/02/2010 1027     Coagulation Profile: No results for input(s): "INR", "PROTIME" in the last 168 hours.  Cardiac Enzymes: No results for input(s): "CKTOTAL", "CKMB", "CKMBINDEX", "TROPONINI" in the last 168 hours.  HbA1C: Hgb A1c MFr Bld  Date/Time Value Ref Range Status  03/27/2012 12:25 PM 5.4 <5.7 % Final    Comment:    (NOTE)                                                                       According to the ADA Clinical Practice Recommendations for 2011, when HbA1c is used as a screening test:  >=6.5%   Diagnostic of Diabetes Mellitus           (if abnormal result is  confirmed) 5.7-6.4%   Increased risk of developing Diabetes Mellitus References:Diagnosis and Classification of Diabetes Mellitus,Diabetes Care,2011,34(Suppl 1):S62-S69 and Standards of Medical Care in         Diabetes - 2011,Diabetes Care,2011,34 (Suppl 1):S11-S61.    CBG: Recent Labs  Lab 05/06/23 0100 05/06/23 0335  GLUCAP 114* 125*    Review of Systems:   somnolent  Past Medical History:  He,  has a past medical history of Allergy, Anemia, Arthritis, Asthma, Depression, Double vision, Duodenal ulcer, GERD (gastroesophageal reflux disease), Hyperlipidemia, Hypertension, Hypothyroidism, Multiple myeloma (07/04/2011), and Thyroid disease.  Surgical History:   Past Surgical History:  Procedure Laterality Date   BONE MARROW TRANSPLANT  2011   for MM   CARDIOLITE STUDY  11/25/2003   NORMAL   TONSILLECTOMY       Social History:   reports that he quit smoking about 54 years ago. His smoking use included cigarettes. He started smoking about 57 years ago. He has never used smokeless tobacco. He reports that he does not drink alcohol and does not use drugs.   Family History:  His family history includes Asthma in his father; Diabetes in his father; Hypertension in his father; Pancreatic cancer in his brother; Stroke in his father; Throat cancer in his mother.   Allergies: Allergies  Allergen Reactions   Atorvastatin Other (See Comments)   Rosuvastatin     Other reaction(s): Liver Disorder   Crestor [Rosuvastatin Calcium]     ADVERSE EFFECTS ON LIVER   Lipitor [Atorvastatin Calcium]    Septra [Sulfamethoxazole-Trimethoprim] Rash     Home Medications: Prior to Admission medications   Medication Sig Start Date End Date Taking? Authorizing Provider  acetaminophen (TYLENOL) 650 MG CR tablet Take 650 mg by mouth every 6 (six) hours as needed for pain.    [provider]  calcium carbonate (TUMS EX) 750 MG chewable tablet Chew 1 tablet by mouth daily.    [provider]  calcium-vitamin D (OSCAL WITH D) 500-200 MG-UNIT tablet Take 1 tablet by mouth daily. 08/14/22   [name], Georgina Quint, MD  Cholecalciferol (VITAMIN D3) 25 MCG (1000 UT) capsule Take 1,000 Units by mouth daily.    [provider]  cyanocobalamin (VITAMIN B12) 1000 MCG tablet Take 1 tablet (1,000 mcg total) by mouth daily. 04/09/23   Rana Snare, NP  dapsone 100 MG tablet Take 100 mg by mouth daily.    Ladene Artist, MD  docusate sodium (COLACE) 100 MG capsule Take 100 mg by mouth daily as needed for mild constipation or moderate constipation.    [provider]  escitalopram (LEXAPRO) 10 MG tablet Take 1 tablet (10 mg total) by mouth daily. 06/27/22   [name], Georgina Quint, MD  ferrous sulfate 325 (65 FE) MG EC tablet Take 325 mg by mouth 2 (two) times daily.    [provider]  folic acid (FOLVITE) 1 MG tablet Take 1 tablet (1 mg total) by mouth daily. 04/09/23   Rana Snare, NP  levothyroxine (SYNTHROID) 125 MCG tablet Take 125 mcg by mouth daily. 04/12/23   [provider]  methylphenidate (RITALIN) 5 MG tablet Take 0.5 tablets (2.5 mg total) by mouth daily. Each morning 04/05/23   Fletcher Anon, NP  pantoprazole (PROTONIX) 40 MG tablet Take 1 tablet (40 mg total) by mouth daily. 04/20/23   Fletcher Anon, NP  triamcinolone ointment (KENALOG) 0.1 % Apply to affected area topically 2 (two) times daily. 08/16/22   [name], Georgina Quint, MD  valACYclovir (VALTREX) 500 MG tablet Take 500 mg by mouth daily.    [provider]  prochlorperazine (COMPAZINE) 10 MG tablet Take 1 tablet (10 mg total) by mouth every 6 (six) hours as needed (Nausea or vomiting). 12/14/20 12/12/21  Magrinat, Valentino Hue, MD    Josephine Igo, DO [facility] 05/06/2023 7:24 AM

## 2023-05-06 NOTE — Progress Notes (Signed)
PHARMACY - PHYSICIAN COMMUNICATION CRITICAL VALUE ALERT - BLOOD CULTURE IDENTIFICATION (BCID)  Andre [name] is an 72 y.o. male who presented to [facility] on 05/05/2023 after being found down with gurgling respirations, concerning for aspiration PNA.  Patient received several doses of cefepime.  Assessment: BCx growing Pseudomonas, no resistance in 1 of 4 bottles thus far.  Name of physician (or Provider) Contacted: Dr. Tonia Brooms  Current antibiotics: none  Changes to prescribed antibiotics recommended:  Recommendations accepted by provider >> continue Cefepime  Results for orders placed or performed during the hospital encounter of 05/05/23  Blood Culture ID Panel (Reflexed) (Collected: 05/05/2023  7:01 PM)  Result Value Ref Range   Enterococcus faecalis NOT DETECTED NOT DETECTED   Enterococcus Faecium NOT DETECTED NOT DETECTED   Listeria monocytogenes NOT DETECTED NOT DETECTED   Staphylococcus species NOT DETECTED NOT DETECTED   Staphylococcus aureus (BCID) NOT DETECTED NOT DETECTED   Staphylococcus epidermidis NOT DETECTED NOT DETECTED   Staphylococcus lugdunensis NOT DETECTED NOT DETECTED   Streptococcus species NOT DETECTED NOT DETECTED   Streptococcus agalactiae NOT DETECTED NOT DETECTED   Streptococcus pneumoniae NOT DETECTED NOT DETECTED   Streptococcus pyogenes NOT DETECTED NOT DETECTED   A.calcoaceticus-baumannii NOT DETECTED NOT DETECTED   Bacteroides fragilis NOT DETECTED NOT DETECTED   Enterobacterales NOT DETECTED NOT DETECTED   Enterobacter cloacae complex NOT DETECTED NOT DETECTED   Escherichia coli NOT DETECTED NOT DETECTED   Klebsiella aerogenes NOT DETECTED NOT DETECTED   Klebsiella oxytoca NOT DETECTED NOT DETECTED   Klebsiella pneumoniae NOT DETECTED NOT DETECTED   Proteus species NOT DETECTED NOT DETECTED   Salmonella species NOT DETECTED NOT DETECTED   Serratia marcescens NOT DETECTED NOT DETECTED   Haemophilus influenzae NOT DETECTED NOT DETECTED    Neisseria meningitidis NOT DETECTED NOT DETECTED   Pseudomonas aeruginosa DETECTED (A) NOT DETECTED   Stenotrophomonas maltophilia NOT DETECTED NOT DETECTED   Candida albicans NOT DETECTED NOT DETECTED   Candida auris NOT DETECTED NOT DETECTED   Candida glabrata NOT DETECTED NOT DETECTED   Candida krusei NOT DETECTED NOT DETECTED   Candida parapsilosis NOT DETECTED NOT DETECTED   Candida tropicalis NOT DETECTED NOT DETECTED   Cryptococcus neoformans/gattii NOT DETECTED NOT DETECTED   CTX-M ESBL NOT DETECTED NOT DETECTED   Carbapenem resistance IMP NOT DETECTED NOT DETECTED   Carbapenem resistance KPC NOT DETECTED NOT DETECTED   Carbapenem resistance NDM NOT DETECTED NOT DETECTED   Carbapenem resistance VIM NOT DETECTED NOT DETECTED    Eulamae Greenstein D. Laney Potash, PharmD, BCPS, BCCCP 05/06/2023, 2:26 PM

## 2023-05-07 ENCOUNTER — Ambulatory Visit: Payer: Medicare Other | Admitting: Physician Assistant

## 2023-05-07 ENCOUNTER — Telehealth: Payer: Self-pay | Admitting: Physician Assistant

## 2023-05-07 DIAGNOSIS — D61818 Other pancytopenia: Secondary | ICD-10-CM | POA: Diagnosis not present

## 2023-05-07 DIAGNOSIS — J9601 Acute respiratory failure with hypoxia: Secondary | ICD-10-CM | POA: Diagnosis not present

## 2023-05-07 DIAGNOSIS — A4152 Sepsis due to Pseudomonas: Secondary | ICD-10-CM | POA: Diagnosis not present

## 2023-05-07 DIAGNOSIS — C9000 Multiple myeloma not having achieved remission: Secondary | ICD-10-CM | POA: Diagnosis not present

## 2023-05-07 DIAGNOSIS — R5381 Other malaise: Secondary | ICD-10-CM

## 2023-05-07 DIAGNOSIS — K219 Gastro-esophageal reflux disease without esophagitis: Secondary | ICD-10-CM

## 2023-05-07 DIAGNOSIS — E039 Hypothyroidism, unspecified: Secondary | ICD-10-CM

## 2023-05-07 LAB — PREPARE RBC (CROSSMATCH)

## 2023-05-07 LAB — HEMOGLOBIN AND HEMATOCRIT, BLOOD
HCT: 28.6 % — ABNORMAL LOW (ref 39.0–52.0)
Hemoglobin: 9.3 g/dL — ABNORMAL LOW (ref 13.0–17.0)

## 2023-05-07 LAB — CBC
HCT: 23.5 % — ABNORMAL LOW (ref 39.0–52.0)
Hemoglobin: 7.4 g/dL — ABNORMAL LOW (ref 13.0–17.0)
MCH: 37.2 pg — ABNORMAL HIGH (ref 26.0–34.0)
MCHC: 31.5 g/dL (ref 30.0–36.0)
MCV: 118.1 fL — ABNORMAL HIGH (ref 80.0–100.0)
Platelets: 21 10*3/uL — CL (ref 150–400)
RBC: 1.99 MIL/uL — ABNORMAL LOW (ref 4.22–5.81)
RDW: 19.3 % — ABNORMAL HIGH (ref 11.5–15.5)
WBC: 3.8 10*3/uL — ABNORMAL LOW (ref 4.0–10.5)
nRBC: 0 % (ref 0.0–0.2)

## 2023-05-07 LAB — HEMOGLOBIN A1C
Hgb A1c MFr Bld: 4.3 % — ABNORMAL LOW (ref 4.8–5.6)
Mean Plasma Glucose: 77 mg/dL

## 2023-05-07 LAB — BASIC METABOLIC PANEL
Anion gap: 9 (ref 5–15)
BUN: 15 mg/dL (ref 8–23)
CO2: 24 mmol/L (ref 22–32)
Calcium: 9 mg/dL (ref 8.9–10.3)
Chloride: 104 mmol/L (ref 98–111)
Creatinine, Ser: 0.87 mg/dL (ref 0.61–1.24)
GFR, Estimated: >60 mL/min (ref >60)
Glucose, Bld: 98 mg/dL (ref 70–99)
Potassium: 4.2 mmol/L (ref 3.5–5.1)
Sodium: 137 mmol/L (ref 135–145)

## 2023-05-07 LAB — GLUCOSE, CAPILLARY
Glucose-Capillary: 108 mg/dL — ABNORMAL HIGH (ref 70–99)
Glucose-Capillary: 110 mg/dL — ABNORMAL HIGH (ref 70–99)
Glucose-Capillary: 112 mg/dL — ABNORMAL HIGH (ref 70–99)
Glucose-Capillary: 113 mg/dL — ABNORMAL HIGH (ref 70–99)
Glucose-Capillary: 87 mg/dL (ref 70–99)
Glucose-Capillary: 91 mg/dL (ref 70–99)

## 2023-05-07 MED ORDER — DAPSONE 100 MG PO TABS
100.0000 mg | ORAL_TABLET | Freq: Every day | ORAL | Status: DC
  Administered 2023-05-07 – 2023-05-11 (×5): 100 mg via ORAL
  Filled 2023-05-07 (×5): qty 1

## 2023-05-07 MED ORDER — VALACYCLOVIR HCL 500 MG PO TABS
500.0000 mg | ORAL_TABLET | Freq: Every day | ORAL | Status: DC
  Administered 2023-05-07 – 2023-05-11 (×5): 500 mg via ORAL
  Filled 2023-05-07 (×5): qty 1

## 2023-05-07 MED ORDER — LEVOTHYROXINE SODIUM 25 MCG PO TABS
125.0000 ug | ORAL_TABLET | Freq: Every day | ORAL | Status: DC
  Administered 2023-05-07 – 2023-05-11 (×5): 125 ug via ORAL
  Filled 2023-05-07 (×8): qty 1

## 2023-05-07 MED ORDER — METHYLPHENIDATE HCL 5 MG PO TABS
2.5000 mg | ORAL_TABLET | Freq: Every day | ORAL | Status: DC
  Administered 2023-05-08 – 2023-05-11 (×4): 2.5 mg via ORAL
  Filled 2023-05-07 (×4): qty 1

## 2023-05-07 MED ORDER — VITAMIN B-12 1000 MCG PO TABS
1000.0000 ug | ORAL_TABLET | Freq: Every day | ORAL | Status: DC
  Administered 2023-05-07 – 2023-05-11 (×5): 1000 ug via ORAL
  Filled 2023-05-07 (×5): qty 1

## 2023-05-07 MED ORDER — CALCIUM CARBONATE ANTACID 750 MG PO CHEW: 2.0000 | CHEWABLE_TABLET | Freq: Three times a day (TID) | ORAL | Status: DC | PRN

## 2023-05-07 MED ORDER — PANTOPRAZOLE SODIUM 40 MG PO TBEC
40.0000 mg | DELAYED_RELEASE_TABLET | Freq: Every day | ORAL | Status: DC
  Administered 2023-05-07 – 2023-05-11 (×5): 40 mg via ORAL
  Filled 2023-05-07 (×5): qty 1

## 2023-05-07 MED ORDER — CALCIUM CARBONATE-VITAMIN D 500-200 MG-UNIT PO TABS: 1.0000 | ORAL_TABLET | Freq: Every day | ORAL | Status: DC

## 2023-05-07 MED ORDER — CALCIUM CARBONATE 1250 (500 CA) MG PO TABS
1.0000 | ORAL_TABLET | Freq: Every day | ORAL | Status: DC
  Administered 2023-05-08 – 2023-05-11 (×4): 1250 mg via ORAL
  Filled 2023-05-07 (×4): qty 1

## 2023-05-07 MED ORDER — ESCITALOPRAM OXALATE 10 MG PO TABS
10.0000 mg | ORAL_TABLET | Freq: Every day | ORAL | Status: DC
  Administered 2023-05-07 – 2023-05-11 (×5): 10 mg via ORAL
  Filled 2023-05-07 (×5): qty 1

## 2023-05-07 MED ORDER — FERROUS SULFATE 325 (65 FE) MG PO TABS
325.0000 mg | ORAL_TABLET | Freq: Two times a day (BID) | ORAL | Status: DC
  Administered 2023-05-07 – 2023-05-11 (×9): 325 mg via ORAL
  Filled 2023-05-07 (×9): qty 1

## 2023-05-07 MED ORDER — SODIUM CHLORIDE 0.9% IV SOLUTION: Freq: Once | INTRAVENOUS | Status: DC

## 2023-05-07 MED ORDER — SODIUM CHLORIDE 0.9 % IV SOLN
2.0000 g | Freq: Three times a day (TID) | INTRAVENOUS | Status: DC
  Administered 2023-05-07 – 2023-05-09 (×6): 2 g via INTRAVENOUS
  Filled 2023-05-07 (×6): qty 12.5

## 2023-05-07 MED ORDER — FOLIC ACID 1 MG PO TABS
1.0000 mg | ORAL_TABLET | Freq: Every day | ORAL | Status: DC
  Administered 2023-05-07 – 2023-05-11 (×5): 1 mg via ORAL
  Filled 2023-05-07 (×5): qty 1

## 2023-05-07 NOTE — Plan of Care (Signed)
  Problem: Education: Goal: Knowledge of General Education information will improve Description Including pain rating scale, medication(s)/side effects and non-pharmacologic comfort measures Outcome: Progressing   Problem: Health Behavior/Discharge Planning: Goal: Ability to manage health-related needs will improve Outcome: Progressing   

## 2023-05-07 NOTE — Assessment & Plan Note (Signed)
PT and OT are recommending SNF

## 2023-05-07 NOTE — Assessment & Plan Note (Signed)
Patient has an history of chronic pancytopenia secondary to CAR-T therapy with Carvykti. Worsening cell counts with hemoglobin at 7.4 and platelet 21. Oncology was consulted-Per secure chat they advise keeping the hemoglobin above 8 and platelet transfusion if platelet drops below 20. They also want to to check IgG levels and if low they will give another IGIV infusion, apparently patient gets monthly IVIG's. -Ordered 1 unit of PRBC -Monitor CBC closely

## 2023-05-07 NOTE — Assessment & Plan Note (Signed)
Likely secondary to aspiration pneumonia versus pneumonitis.  CT chest with bibasilar atelectasis.  No baseline oxygen use. -Continue with supplemental oxygen-wean as tolerated

## 2023-05-07 NOTE — Progress Notes (Signed)
PHARMACY NOTE:  ANTIMICROBIAL RENAL DOSAGE ADJUSTMENT  Current antimicrobial regimen includes a mismatch between antimicrobial dosage and estimated renal function.  As per policy approved by the Pharmacy & Therapeutics and Medical Executive Committees, the antimicrobial dosage will be adjusted accordingly.  Current antimicrobial dosage:  cefepime 2g IV q12h  Indication: Pseudomonas bacteremia  Renal Function:  Estimated Creatinine Clearance: 77.5 mL/min (by C-G formula based on SCr of 0.87 mg/dL). []      On intermittent HD, scheduled: []      On CRRT    Antimicrobial dosage has been changed to:  cefepime 2g IV q8h  Additional comments:   Thank you for allowing pharmacy to be a part of this patient's care.  Mosetta Anis, [facility] 05/07/2023 8:58 AM

## 2023-05-07 NOTE — Assessment & Plan Note (Signed)
-   Continue home Synthroid °

## 2023-05-07 NOTE — Telephone Encounter (Signed)
Talked to patients wife and told her Maralyn Sago said not to worry about appointment. When he gets out of hospital to call and we can do virtual.

## 2023-05-07 NOTE — Hospital Course (Addendum)
PCCM transfer for 05/07/2023.  Taken from prior notes.  72 year old gentleman with past medical history significant for multiple myeloma s/p stem cell transplant, prior CAR-TEE in January with Carvykti, history of pancytopenia, hypertension and hypothyroidism admitted on 05/05/2023 with concern of altered mental status.  He was found down in his bed with gurgling respiration, had an emesis which was suctioned for large amount of gastric contents.  He was significantly hypoxic by EMS.  Initially admitted with concern of sepsis, blood cultures positive for Pseudomonas aeruginosa, initially received broad-spectrum antibiotics with cefepime and vancomycin and later cefepime was continued.  8/19: Vitals with mild tachypnea, on 4 L of oxygen.  No baseline oxygen use, CBC with worsening hemoglobin at 7.4, platelets 21.  Patient has an history of Hemoccult positive stool, have recently saw GI, they were planning EGD and colonoscopy if medically stable.  Giving 1 unit of blood and consulting oncology for worsening thrombocytopenia, oncology recommended checking IgG and if level is low they will give him another dose of IVIG, apparently getting monthly IVIG's infusion. PT and OT are recommending SNF  8/20: Vital stable, hemoglobin at 8.7 s/p 1 unit of PRBC, platelets 25, IgG labs pending.  Initial blood cultures with Pseudomonas aeruginosa-good sensitivity. Repeating blood culture.  Swallow team is recommending barium swallow studies. ID was consulted to help with type and duration of antibiotic due to his immunocompromise status.

## 2023-05-07 NOTE — Assessment & Plan Note (Signed)
S/p stem cell transplant in [place].  Currently getting CRT-T therapy. Oncology was consulted-they will see the patient. -Follow-up oncology recommendations

## 2023-05-07 NOTE — Assessment & Plan Note (Signed)
Patient met sepsis criteria with fever, leukocytosis, blood cultures positive for Pseudomonas aeruginosa.  Initially received broad-spectrum antibiotics with cefepime and vancomycin, vancomycin was later discontinued. Repeat blood cultures from today are negative so far. -Continue with cefepime

## 2023-05-07 NOTE — Evaluation (Addendum)
Physical Therapy Evaluation Patient Details Name: Andre [name] MRN: [id number] DOB: 04/04/1951 Today's Date: 05/07/2023  History of Present Illness  Pt is a 71yo male who present to ED after being found down in his bed with gurgling respirations, + emesis, and  suctioned for large amount of gastric contents. Pt was hypoxemic. Found to have apiration pneumonia PMH: mulitple myeloma, stem cell transplant, CAR-T therapy in January-slow response to treatment, thyroid disease, HTN, GERD, duodenal ulcer, depression   Clinical Impression  Pt admitted with above. Per pt he was at the ALF part of [facility] for the last few weeks (transitioned from the rehab section) and was able to indep dress/bath self, amb with RW down to dining hall, and facility staff manages his medication. Pt currently functioning at min assist and with noted increased fall risk. At this time recommending to return to SNF portion however pending length of stay in hospital may progress well enough to return to ALF apartment. Recommend OT consult to address ADLs as patient reports he was doing them independently and would ultimately like to return to ALF instead of SNF.  Acute PT to cont to follow to progress mobility.        If plan is discharge home, recommend the following: A little help with walking and/or transfers;A little help with bathing/dressing/bathroom;Assistance with cooking/housework;Direct supervision/assist for medications management;Direct supervision/assist for financial management;Assist for transportation   Can travel by private vehicle   Yes    Equipment Recommendations None recommended by PT  Recommendations for Other Services       Functional Status Assessment Patient has had a recent decline in their functional status and demonstrates the ability to make significant improvements in function in a reasonable and predictable amount of time.     Precautions / Restrictions Precautions Precautions:  Fall Precaution Comments: watch SpO2 Restrictions Weight Bearing Restrictions: No      Mobility  Bed Mobility Overal bed mobility: Needs Assistance Bed Mobility: Supine to Sit     Supine to sit: Min assist, HOB elevated     General bed mobility comments: increased time, HOB elevated, minA for trunk elevation inititation, pt then able to use bilat UEs to scoot forward to EOB    Transfers Overall transfer level: Needs assistance Equipment used: Rolling walker (2 wheels) Transfers: Sit to/from Stand Sit to Stand: Contact guard assist           General transfer comment: verbal cues for safe hand placement, increased time, 2nd stand better than first    Ambulation/Gait Ambulation/Gait assistance: Contact guard assist Gait Distance (Feet): 120 Feet Assistive device: Rolling walker (2 wheels) Gait Pattern/deviations: Step-through pattern, Decreased stride length, Trunk flexed Gait velocity: dec Gait velocity interpretation: <1.31 ft/sec, indicative of household ambulator   General Gait Details: pt with reciprocal pattern, progressive increased step length with increased distance. noted SOB, SPO2 >92% on 4LO2 via [place]  Stairs            Wheelchair Mobility     Tilt Bed    Modified Rankin (Stroke Patients Only)       Balance Overall balance assessment: Needs assistance Sitting-balance support: Feet supported, Bilateral upper extremity supported Sitting balance-Leahy Scale: Fair     Standing balance support: Bilateral upper extremity supported, During functional activity, Reliant on assistive device for balance Standing balance-Leahy Scale: Poor Standing balance comment: benefits from RW  Pertinent Vitals/Pain Pain Assessment Pain Assessment: Faces Faces Pain Scale: Hurts little more Pain Location: neck from sleeping position Pain Descriptors / Indicators: Sore Pain Intervention(s): Monitored during session     Home Living Family/patient expects to be discharged to:: Skilled nursing facility (vs ALF)                   Additional Comments: pt was at rehab portion of [facility] but has recently transitioned to  the ALF part.    Prior Function Prior Level of Function : Needs assist             Mobility Comments: per patient he was using RW as primary mode of mobility and able to amb down to dining hall without difficulty ADLs Comments: per patient he dresses and baths himself, RN staff manages medicine     Extremity/Trunk Assessment   Upper Extremity Assessment Upper Extremity Assessment: Generalized weakness (functionally appears to have R hand fine motor deficits)    Lower Extremity Assessment Lower Extremity Assessment: Generalized weakness    Cervical / Trunk Assessment Cervical / Trunk Assessment: Kyphotic  Communication   Communication Communication: No apparent difficulties  Cognition Arousal: Alert Behavior During Therapy: Flat affect Overall Cognitive Status: No family/caregiver present to determine baseline cognitive functioning                                 General Comments: pt with delayed response time but able to follow all commands, A&Ox4        General Comments General comments (skin integrity, edema, etc.): SpO2 >92% on 4LO2 via [place]    Exercises     Assessment/Plan    PT Assessment Patient needs continued PT services  PT Problem List Decreased strength;Decreased range of motion;Decreased activity tolerance;Decreased balance;Decreased mobility;Decreased coordination;Decreased cognition;Decreased knowledge of use of DME       PT Treatment Interventions DME instruction;Gait training;Stair training;Functional mobility training;Therapeutic activities;Therapeutic exercise;Balance training    PT Goals (Current goals can be found in the Care Plan section)  Acute Rehab PT Goals Patient Stated Goal: go back to ALF not the rehab  part PT Goal Formulation: With patient Time For Goal Achievement: 05/21/23 Potential to Achieve Goals: Good    Frequency Min 1X/week     Co-evaluation               AM-PAC PT "6 Clicks" Mobility  Outcome Measure Help needed turning from your back to your side while in a flat bed without using bedrails?: A Lot Help needed moving from lying on your back to sitting on the side of a flat bed without using bedrails?: A Little Help needed moving to and from a bed to a chair (including a wheelchair)?: A Little Help needed standing up from a chair using your arms (e.g., wheelchair or bedside chair)?: A Little Help needed to walk in hospital room?: A Lot Help needed climbing 3-5 steps with a railing? : A Lot 6 Click Score: 15    End of Session Equipment Utilized During Treatment: Gait belt;Oxygen Activity Tolerance: Patient tolerated treatment well Patient left: in chair;with call bell/phone within reach;with chair alarm set Nurse Communication: Mobility status PT Visit Diagnosis: Unsteadiness on feet (R26.81);Muscle weakness (generalized) (M62.81)    Time: 1610-9604 PT Time Calculation (min) (ACUTE ONLY): 36 min   Charges:   PT Evaluation $PT Eval Moderate Complexity: 1 Mod PT Treatments $Gait Training: 8-22 mins PT General Charges $$  ACUTE PT VISIT: 1 Visit         Lewis Shock, PT, DPT Acute Rehabilitation Services Secure chat preferred Office #: [phone]   Iona Hansen 05/07/2023, 9:37 AM

## 2023-05-07 NOTE — Progress Notes (Signed)
Progress Note   Patient: Andre [name]:[id number] DOB: Jun 15, 1951 DOA: 05/05/2023     2 DOS: the patient was seen and examined on 05/07/2023   Brief hospital course: PCCM transfer for 05/07/2023.  Taken from prior notes.  72 year old gentleman with past medical history significant for multiple myeloma s/p stem cell transplant, prior CAR-TEE in January with Carvykti, history of pancytopenia, hypertension and hypothyroidism admitted on 05/05/2023 with concern of altered mental status.  He was found down in his bed with gurgling respiration, had an emesis which was suctioned for large amount of gastric contents.  He was significantly hypoxic by EMS.  Initially admitted with concern of sepsis, blood cultures positive for Pseudomonas aeruginosa, initially received broad-spectrum antibiotics with cefepime and vancomycin and later cefepime was continued.  8/19: Vitals with mild tachypnea, on 4 L of oxygen.  No baseline oxygen use, CBC with worsening hemoglobin at 7.4, platelets 21.  Patient has an history of Hemoccult positive stool, have recently saw GI, they were planning EGD and colonoscopy if medically stable.  Giving 1 unit of blood and consulting oncology for worsening thrombocytopenia, oncology recommended checking IgG and if level is low they will give him another dose of IVIG, apparently getting monthly IVIG's infusion. PT and OT are recommending SNF  Assessment and Plan: * Acute hypoxemic respiratory failure (HCC) Likely secondary to aspiration pneumonia versus pneumonitis.  CT chest with bibasilar atelectasis.  No baseline oxygen use. -Continue with supplemental oxygen-wean as tolerated  Sepsis (HCC) Patient met sepsis criteria with fever, leukocytosis, blood cultures positive for Pseudomonas aeruginosa.  Initially received broad-spectrum antibiotics with cefepime and vancomycin, vancomycin was later discontinued. Repeat blood cultures from today are negative so far. -Continue with  cefepime   Pancytopenia (HCC) Patient has an history of chronic pancytopenia secondary to CAR-T therapy with Carvykti. Worsening cell counts with hemoglobin at 7.4 and platelet 21. Oncology was consulted-Per secure chat they advise keeping the hemoglobin above 8 and platelet transfusion if platelet drops below 20. They also want to to check IgG levels and if low they will give another IGIV infusion, apparently patient gets monthly IVIG's. -Ordered 1 unit of PRBC -Monitor CBC closely  Multiple myeloma not having achieved remission [facility]) S/p stem cell transplant in [place].  Currently getting CRT-T therapy. Oncology was consulted-they will see the patient. -Follow-up oncology recommendations  Hypothyroidism -Continue home Synthroid  GERD (gastroesophageal reflux disease) -Continue home Protonix  Physical deconditioning PT and OT are recommending SNF   Subjective: Patient was sitting in chair comfortably when seen today.  No baseline oxygen use.  Denies any shortness of breath or chest pain.  Physical Exam: Vitals:   05/07/23 1300 05/07/23 1400 05/07/23 1402 05/07/23 1406  BP: 134/74  (!) 150/82 (!) 116/55  Pulse: 66 92 93 91  Resp: 19 (!) 23 19 17   Temp:   98.6 F (37 C)   TempSrc:   Axillary   SpO2: 90% 96% 93% 95%  Weight:       General.  Well-developed elderly man, in no acute distress. Pulmonary.  Lungs clear bilaterally, normal respiratory effort. CV.  Regular rate and rhythm, no JVD, rub or murmur. Abdomen.  Soft, nontender, nondistended, BS positive. CNS.  Alert and oriented .  No focal neurologic deficit. Extremities.  No edema, no cyanosis, pulses intact and symmetrical. Psychiatry.  Judgment and insight appears normal.   Data Reviewed: Prior data reviewed.  Family Communication: Called wife with no response  Disposition: Status is: Inpatient Remains inpatient appropriate because:  Severity of illness  Planned Discharge Destination: Skilled nursing  facility  Time spent: 50 minutes  This record has been created using Conservation officer, historic buildings. Errors have been sought and corrected,but may not always be located. Such creation errors do not reflect on the standard of care.   Author: Arnetha Courser, MD 05/07/2023 2:11 PM  For on call review [email or web address].

## 2023-05-07 NOTE — Telephone Encounter (Signed)
Pts spouse is calling in stating that she would like to do a virtual appt with the provider if possible due to the pt not being able to come and this is his second cancellation and is in the hospital at this present time.  Spouse would like to see if she could get a call back.

## 2023-05-07 NOTE — TOC Initial Note (Signed)
Transition of Care [facility]) - Initial/Assessment Note    Patient Details  Name: Andre [name] MRN: [id number] Date of Birth: [birth year]  Transition of Care [facility]) CM/SW Contact:    Delilah Shan, LCSWA Phone Number: 05/07/2023, 4:36 PM  Clinical Narrative:                CSW received consult for possible SNF placement at time of discharge. CSW spoke with patients spouse who was with patient at bedside regarding PT recommendation of SNF placement at time of discharge. PTA patient comes from [facility].Patients spouse expressed understanding of PT recommendation and would like for CSW to follow back up on dc plan for patient. Patient would like to see how he progresses with PT . If PT still recommends SNF closer to patient being medically ready then spouse said that patient would be interested in getting rehab at [facility]. CSW will continue to follow and follow back up with patient on dc plan once able to work more with PT and closer to patient being medically ready. All questions answered. No further questions reported at this time. CSW to continue to follow and assist with discharge planning needs.      Expected Discharge Plan:  (TBD) Barriers to Discharge: Continued Medical Work up   Patient Goals and CMS Choice Patient states their goals for this hospitalization and ongoing recovery are:: TBD wants to see how he progresses with PT next couple days   Choice offered to / list presented to : Spouse, Patient      Expected Discharge Plan and Services In-house Referral: Clinical Social Work     Living arrangements for the past 2 months: Assisted Higher education careers adviser)                                      Prior Living Arrangements/Services Living arrangements for the past 2 months: Assisted Living Facility Interior and spatial designer) Lives with:: Facility Resident Patient language and need for interpreter reviewed:: Yes        Need for Family Participation in Patient Care:  Yes (Comment) Care giver support system in place?: Yes (comment)   Criminal Activity/Legal Involvement Pertinent to Current Situation/Hospitalization: No - Comment as needed  Activities of Daily Living      Permission Sought/Granted Permission sought to share information with : Case Manager, Family Supports, Magazine features editor Permission granted to share information with : Yes, Verbal Permission Granted  Share Information with NAME: [name]  Permission granted to share info w AGENCY: SNF  Permission granted to share info w Relationship: Spouse  Permission granted to share info w Contact Information: [name]  Emotional Assessment Appearance:: Appears stated age Attitude/Demeanor/Rapport: Gracious Affect (typically observed): Calm Orientation: : Oriented to Self, Oriented to Place, Oriented to  Time, Oriented to Situation Alcohol / Substance Use: Not Applicable Psych Involvement: No (comment)  Admission diagnosis:  Respiratory distress [R06.03] Hypoxia [R09.02] Acute hypoxemic respiratory failure (HCC) [J96.01] Patient Active Problem List   Diagnosis Date Noted   Acute hypoxemic respiratory failure (HCC) 05/05/2023   Occult blood in stools 05/02/2023   Hypokalemia 11/13/2022   Reduced mobility 10/13/2022   Self-care deficit 10/13/2022   Physical deconditioning 10/02/2022   History of engineered cell therapy infusion 09/26/2022   Meteorism 02/09/2022   Memory problem 08/30/2021   Accidental fall 02/19/2021   Periorbital contusion of right eye 02/19/2021   Contusion of right hand 02/19/2021  Hemorrhoids 11/20/2019   Neuropathy due to chemotherapeutic drug (HCC) 09/01/2019   Leukopenia due to antineoplastic chemotherapy (HCC) 02/22/2017   Dry skin dermatitis 08/16/2016   Peripheral edema 04/26/2016   Bone marrow transplant status (HCC) 03/14/2016   Debility 02/26/2016   Back pain 02/26/2016   Febrile neutropenia (HCC) 02/15/2016   Patient in clinical research  study 01/10/2016   Rash and nonspecific skin eruption 01/06/2016   HCAP (healthcare-associated pneumonia)    GERD (gastroesophageal reflux disease)    Dehydration 12/22/2015   Weakness 12/22/2015   Asthma exacerbation 12/22/2015   Hyponatremia 12/22/2015   General weakness    Multiple myeloma not having achieved remission (HCC)    Sepsis (HCC)    Chronic anemia    SBO (small bowel obstruction) (HCC) 11/07/2015   Constipation    Actinic keratosis 07/08/2015   Transient global amnesia 06/07/2015   Diarrhea 09/17/2014   Raynaud's phenomenon 09/17/2013   Hemorrhage of rectum and anus 09/15/2013   Skin lesion, superficial 07/29/2013   Cough 01/10/2013   Abnormal brain MRI 10/08/2012   Vitamin D deficiency 09/30/2012   Hypothyroidism 09/30/2012   Hypogonadism male 09/30/2012   Fatigue 09/27/2012   Insomnia 09/27/2012   Reactive depression (situational) 09/27/2012   Goals of care, counseling/discussion 09/27/2012   TIA (transient ischemic attack) 03/27/2012   Transient diplopia 03/27/2012   Pancytopenia (HCC) 03/27/2012   HYPERLIPIDEMIA 05/30/2010   Anemia of chronic disease 05/30/2010   Asthma 05/30/2010   SCHATZKI'S RING 05/30/2010   Peptic ulcer disease 05/30/2010   HIATAL HERNIA 05/30/2010   SLEEP APNEA, MILD 05/30/2010   TRANSAMINASES, SERUM, ELEVATED 05/30/2010   PCP:  [name], MD Pharmacy:   [facility] [phone] Ginette Otto, [place] - [address] AT [facility] [address] [place] [address] Phone: [phone] Fax: [phone]  [facility] [address] [place] [address] Phone: [phone] Fax: [phone]  [facility] [address] [place] [address] Phone: [phone] Fax: [phone]  [facility] [place], [place] - [place] [address] [place]  [address] Phone: [phone] Fax: [phone]     Social Determinants of Health (SDOH) Social History: SDOH Screenings   Food Insecurity: Low Risk  (10/13/2022)   Received from [facility]  Transportation Needs: No Transportation Needs (11/17/2022)  Utilities: Low Risk  (10/13/2022)   Received from [facility]  Depression (PHQ2-9): Low Risk  (06/08/2022)  Tobacco Use: Medium Risk (05/02/2023)   SDOH Interventions:     Readmission Risk Interventions     No data to display

## 2023-05-07 NOTE — Assessment & Plan Note (Signed)
-   Continue home Protonix 

## 2023-05-08 DIAGNOSIS — D61818 Other pancytopenia: Secondary | ICD-10-CM | POA: Diagnosis not present

## 2023-05-08 DIAGNOSIS — J9601 Acute respiratory failure with hypoxia: Secondary | ICD-10-CM | POA: Diagnosis not present

## 2023-05-08 DIAGNOSIS — A4152 Sepsis due to Pseudomonas: Secondary | ICD-10-CM | POA: Diagnosis not present

## 2023-05-08 DIAGNOSIS — C9000 Multiple myeloma not having achieved remission: Secondary | ICD-10-CM | POA: Diagnosis not present

## 2023-05-08 LAB — CBC WITH DIFFERENTIAL/PLATELET
Abs Immature Granulocytes: 0.07 10*3/uL (ref 0.00–0.07)
Basophils Absolute: 0 10*3/uL (ref 0.0–0.1)
Basophils Relative: 0 %
Eosinophils Absolute: 0 10*3/uL (ref 0.0–0.5)
Eosinophils Relative: 1 %
HCT: 27.9 % — ABNORMAL LOW (ref 39.0–52.0)
Hemoglobin: 9.1 g/dL — ABNORMAL LOW (ref 13.0–17.0)
Immature Granulocytes: 3 %
Lymphocytes Relative: 6 %
Lymphs Abs: 0.1 10*3/uL — ABNORMAL LOW (ref 0.7–4.0)
MCH: 36.1 pg — ABNORMAL HIGH (ref 26.0–34.0)
MCHC: 32.6 g/dL (ref 30.0–36.0)
MCV: 110.7 fL — ABNORMAL HIGH (ref 80.0–100.0)
Monocytes Absolute: 0.2 10*3/uL (ref 0.1–1.0)
Monocytes Relative: 9 %
Neutro Abs: 1.8 10*3/uL (ref 1.7–7.7)
Neutrophils Relative %: 81 %
Platelets: 25 10*3/uL — CL (ref 150–400)
RBC: 2.52 MIL/uL — ABNORMAL LOW (ref 4.22–5.81)
WBC: 2.3 10*3/uL — ABNORMAL LOW (ref 4.0–10.5)
nRBC: 0 % (ref 0.0–0.2)

## 2023-05-08 LAB — CULTURE, BLOOD (ROUTINE X 2)

## 2023-05-08 LAB — CBC
HCT: 25.8 % — ABNORMAL LOW (ref 39.0–52.0)
Hemoglobin: 8.7 g/dL — ABNORMAL LOW (ref 13.0–17.0)
MCH: 36.6 pg — ABNORMAL HIGH (ref 26.0–34.0)
MCHC: 33.7 g/dL (ref 30.0–36.0)
MCV: 108.4 fL — ABNORMAL HIGH (ref 80.0–100.0)
Platelets: 25 10*3/uL — CL (ref 150–400)
RBC: 2.38 MIL/uL — ABNORMAL LOW (ref 4.22–5.81)
WBC: 3.1 10*3/uL — ABNORMAL LOW (ref 4.0–10.5)
nRBC: 0 % (ref 0.0–0.2)

## 2023-05-08 LAB — BASIC METABOLIC PANEL
Anion gap: 6 (ref 5–15)
BUN: 16 mg/dL (ref 8–23)
CO2: 24 mmol/L (ref 22–32)
Calcium: 8.8 mg/dL — ABNORMAL LOW (ref 8.9–10.3)
Chloride: 105 mmol/L (ref 98–111)
Creatinine, Ser: 1.04 mg/dL (ref 0.61–1.24)
GFR, Estimated: >60 mL/min (ref >60)
Glucose, Bld: 94 mg/dL (ref 70–99)
Potassium: 3.6 mmol/L (ref 3.5–5.1)
Sodium: 135 mmol/L (ref 135–145)

## 2023-05-08 LAB — GLUCOSE, CAPILLARY
Glucose-Capillary: 105 mg/dL — ABNORMAL HIGH (ref 70–99)
Glucose-Capillary: 117 mg/dL — ABNORMAL HIGH (ref 70–99)
Glucose-Capillary: 145 mg/dL — ABNORMAL HIGH (ref 70–99)
Glucose-Capillary: 81 mg/dL (ref 70–99)
Glucose-Capillary: 96 mg/dL (ref 70–99)
Glucose-Capillary: 99 mg/dL (ref 70–99)

## 2023-05-08 LAB — TYPE AND SCREEN
ABO/RH(D): A POS
Antibody Screen: NEGATIVE
Unit division: 0

## 2023-05-08 LAB — DIFFERENTIAL
Band Neutrophils: 9 %
Basophils Relative: 1 %
Eosinophils Relative: 2 %
Lymphocytes Relative: 7 %
Monocytes Relative: 5 %
Neutrophils Relative %: 76 %

## 2023-05-08 LAB — BPAM RBC
Blood Product Expiration Date: 202409122359
ISSUE DATE / TIME: 202408191420
Unit Type and Rh: 6200

## 2023-05-08 MED ORDER — ENSURE ENLIVE PO LIQD
237.0000 mL | Freq: Two times a day (BID) | ORAL | Status: DC
  Administered 2023-05-08 – 2023-05-11 (×6): 237 mL via ORAL

## 2023-05-08 NOTE — Progress Notes (Signed)
Initial Nutrition Assessment  DOCUMENTATION CODES:   Not applicable  INTERVENTION:  Continue Ensure Plus High Protein po BID, each supplement provides 350 kcal and 20 grams of protein.  Continue DYS 3 diet per SLP recommendation   Encourage po intake   Patient is pescatarian (does not eat meat, only fish) changes reflected in patient's meal profile   NUTRITION DIAGNOSIS:   Swallowing difficulty related to dysphagia as evidenced by other (comment) (SLP assessment).   GOAL:   Patient will meet greater than or equal to 90% of their needs   MONITOR:   PO intake, Skin, Supplement acceptance, I & O's, Labs, Weight trends, Diet advancement  REASON FOR ASSESSMENT:   Consult Assessment of nutrition requirement/status  ASSESSMENT:   72 y.o. male with PMHx including multiple myeloma s/p stem cell transplant, prior CAR-TEE in January, pancytopenia, HTN and hypothyroidism presents with AMS after being found down in bed with gurgling respiration and emesis. Patient admitted for acute hypoxemic respiratory failure  Visited patient at bedside whose wife was present. He reports a good appetite now that he is able to eat. He reports usually eating 3 meals per day at home. Patient's wife reports that he has been gaining weight back after experiencing some weight loss 2/2 cancer treatment.   Patient is a lacto-ovo-pesco-vegetarian Psychologist, occupational). RD reflected such changes in his meal profile so he does not get meat on accident.   Patient denies trouble chewing or swallowing with his current diet. RD answered questions about dysphagia and aspirating.   Patient reports having 2 BM's per week at baseline.   He denies N/V/D.   Patient agreeable to continue ONS BID  Labs: reviewed  Meds: calcium carbonate, maxipime, vitamin B12, ferrous sulfate, folvite, insulin, ritalin, protonix  Wt: stable wt  05/08/23 81.4 kg  05/02/23 81.4 kg  04/20/23 79.7 kg  04/19/23 79.4 kg  04/18/23  76.3 kg  04/16/23 76.3 kg  04/09/23 79.8 kg  03/15/23 76.3 kg  03/14/23 79.8 kg  02/27/23 76.3 kg    PO: 100% x 1 documented meal   I/O's: -301 ml since admission   NUTRITION - FOCUSED PHYSICAL EXAM:  Flowsheet Row Most Recent Value  Orbital Region Moderate depletion  Upper Arm Region Mild depletion  Thoracic and Lumbar Region No depletion  Buccal Region Mild depletion  Temple Region Mild depletion  Clavicle Bone Region No depletion  Clavicle and Acromion Bone Region No depletion  Scapular Bone Region Unable to assess  Dorsal Hand No depletion  Patellar Region No depletion  Anterior Thigh Region No depletion  Posterior Calf Region No depletion  Edema (RD Assessment) None  Hair Reviewed  Eyes Reviewed  Mouth Reviewed  Skin Reviewed  Nails Reviewed       Diet Order:   Diet Order             DIET DYS 3 Room service appropriate? Yes; Fluid consistency: Thin  Diet effective now                   EDUCATION NEEDS:   Education needs have been addressed  Skin:  Skin Assessment: Reviewed RN Assessment  Last BM:  PTA  Height:   Ht Readings from Last 1 Encounters:  05/02/23 5\' 7"  (1.702 m)    Weight:   Wt Readings from Last 1 Encounters:  05/08/23 81.4 kg    Ideal Body Weight:     BMI:  Body mass index is 28.11 kg/m.  Estimated Nutritional Needs:  Kcal:  3016-0109  Protein:  95-125 g  Fluid:  >/= 2L    Leodis Rains, RDN, LDN  Clinical Nutrition

## 2023-05-08 NOTE — Evaluation (Signed)
Occupational Therapy Evaluation Patient Details Name: Andre [name] MRN: [id number] DOB: [birth year] Today's Date: 05/08/2023   History of Present Illness Pt is a 71yo male who present to ED after being found down in his bed with gurgling respirations, + emesis, and  suctioned for large amount of gastric contents. Pt was hypoxemic. Found to have apiration pneumonia PMH: mulitple myeloma, stem cell transplant, CAR-T therapy in January-slow response to treatment, thyroid disease, HTN, GERD, duodenal ulcer, depression   Clinical Impression   Patient admitted for the diagnosis above.  PTA he resides at a local ALF, he walks to the dinning room with his RW, completes his ADL, receives assist with medications and community mobility, and continues to participate with [facility] OT.  Currently he presents with generalized weakness, poor dynamic balance, decreased safety and poor activity tolerance.  Currently he is needing Min A for ADL completion and in room mobility with supplemental O2.  OT will continue efforts in the acute setting to address deficits, and Patient will benefit from continued inpatient follow up therapy, <3 hours/day.        If plan is discharge home, recommend the following: A little help with walking and/or transfers;A little help with bathing/dressing/bathroom;Assist for transportation;Assistance with cooking/housework;Direct supervision/assist for financial management;Direct supervision/assist for medications management    Functional Status Assessment  Patient has had a recent decline in their functional status and demonstrates the ability to make significant improvements in function in a reasonable and predictable amount of time.  Equipment Recommendations  None recommended by OT    Recommendations for Other Services       Precautions / Restrictions Precautions Precautions: Fall Precaution Comments: watch SpO2 Restrictions Weight Bearing Restrictions: No      Mobility Bed  Mobility Overal bed mobility: Needs Assistance Bed Mobility: Supine to Sit, Sit to Supine     Supine to sit: Contact guard, HOB elevated Sit to supine: Contact guard assist     Patient Response: Cooperative  Transfers Overall transfer level: Needs assistance Equipment used: Rolling walker (2 wheels) Transfers: Sit to/from Stand Sit to Stand: Contact guard assist                  Balance Overall balance assessment: Needs assistance Sitting-balance support: Feet supported, Bilateral upper extremity supported Sitting balance-Leahy Scale: Fair     Standing balance support: Reliant on assistive device for balance Standing balance-Leahy Scale: Poor                             ADL either performed or assessed with clinical judgement   ADL Overall ADL's : Needs assistance/impaired Eating/Feeding: Set up;Sitting   Grooming: Wash/dry hands;Oral care;Contact guard assist;Standing   Upper Body Bathing: Set up;Sitting   Lower Body Bathing: Minimal assistance;Sit to/from stand   Upper Body Dressing : Set up;Sitting   Lower Body Dressing: Minimal assistance;Sit to/from stand   Toilet Transfer: Minimal assistance;Rolling walker (2 wheels);Ambulation                   Vision Baseline Vision/History: 1 Wears glasses Patient Visual Report: No change from baseline       Perception Perception: Within Functional Limits       Praxis Praxis: WFL       Pertinent Vitals/Pain Pain Assessment Pain Assessment: No/denies pain     Extremity/Trunk Assessment Upper Extremity Assessment Upper Extremity Assessment: Generalized weakness   Lower Extremity Assessment Lower Extremity Assessment: Defer to PT  evaluation   Cervical / Trunk Assessment Cervical / Trunk Assessment: Kyphotic   Communication Communication Communication: No apparent difficulties   Cognition Arousal: Alert Behavior During Therapy: Flat affect Overall Cognitive Status: Within  Functional Limits for tasks assessed                                 General Comments: dtr present and reports this to be pretty close to baseline, dtr reports memory deficits and difficulty with IADLs like medicine management     General Comments   HR to 99 with increased SOB    Exercises     Shoulder Instructions      Home Living Family/patient expects to be discharged to:: Skilled nursing facility                                 Additional Comments: pt was at rehab portion of [facility] but has recently transitioned to  the ALF part.      Prior Functioning/Environment Prior Level of Function : Needs assist             Mobility Comments: per patient he was using RW as primary mode of mobility and able to amb down to dining hall without difficulty ADLs Comments: per patient he dresses and baths himself, RN staff manages medicine        OT Problem List: Decreased strength;Decreased activity tolerance;Impaired balance (sitting and/or standing);Decreased safety awareness      OT Treatment/Interventions: Self-care/ADL training;Therapeutic exercise;Patient/family education;Balance training;DME and/or AE instruction;Energy conservation    OT Goals(Current goals can be found in the care plan section) Acute Rehab OT Goals Patient Stated Goal: Return to ALF OT Goal Formulation: With patient Time For Goal Achievement: 05/22/23 Potential to Achieve Goals: Good ADL Goals Pt Will Perform Grooming: with modified independence;standing Pt Will Perform Lower Body Dressing: with modified independence;sit to/from stand Pt Will Transfer to Toilet: with modified independence;ambulating;regular height toilet Pt/caregiver will Perform Home Exercise Program: Increased strength;Both right and left upper extremity;With theraband;With Supervision  OT Frequency: Min 1X/week    Co-evaluation              AM-PAC OT "6 Clicks" Daily Activity     Outcome  Measure Help from another person eating meals?: None Help from another person taking care of personal grooming?: A Little Help from another person toileting, which includes using toliet, bedpan, or urinal?: A Little Help from another person bathing (including washing, rinsing, drying)?: A Little Help from another person to put on and taking off regular upper body clothing?: None Help from another person to put on and taking off regular lower body clothing?: A Little 6 Click Score: 20   End of Session Equipment Utilized During Treatment: Rolling walker (2 wheels) Nurse Communication: Mobility status  Activity Tolerance: Patient tolerated treatment well Patient left: in bed;with call bell/phone within reach;with bed alarm set;with family/visitor present  OT Visit Diagnosis: Unsteadiness on feet (R26.81)                Time: 4431-5400 OT Time Calculation (min): 25 min Charges:  OT General Charges $OT Visit: 1 Visit OT Evaluation $OT Eval Moderate Complexity: 1 Mod OT Treatments $Self Care/Home Management : 8-22 mins  05/08/2023  RP, OTR/L  Acute Rehabilitation Services  Office:  [phone]   Suzanna Obey 05/08/2023, 2:00 PM

## 2023-05-08 NOTE — Evaluation (Signed)
Clinical/Bedside Swallow Evaluation Patient Details  Name: Andre [name] MRN: [id number] Date of Birth: 1951-01-24  Today's Date: 05/08/2023 Time: SLP Start Time (ACUTE ONLY): 0920 SLP Stop Time (ACUTE ONLY): 0940 SLP Time Calculation (min) (ACUTE ONLY): 20 min  Past Medical History:  Past Medical History:  Diagnosis Date   Allergy    Anemia    Arthritis    Asthma    no treatment x 20 years   Depression    Double vision    occurs at times    Duodenal ulcer    GERD (gastroesophageal reflux disease)    Hyperlipidemia    Hypertension    Hypothyroidism    Multiple myeloma 07/04/2011   Thyroid disease    Past Surgical History:  Past Surgical History:  Procedure Laterality Date   BONE MARROW TRANSPLANT  2011   for MM   CARDIOLITE STUDY  11/25/2003   NORMAL   TONSILLECTOMY     HPI:  Patient is a 72 y.o. male with PMH: GERD, HTN, HLD, thyroid disease, multiple myeloma, large hiatal hernia, dysphagia. He had an MBS at a different hospital with SLP reporting penetration with thin liquids via straw sips but not with cup sips. He presented to the hospital on 05/05/23 after being found down in his bed with gurgling respirations, emesis and was suctioned for large amount of gastric contents; he was hypoxemic and found to have aspiration PNA.    Assessment / Plan / Recommendation  Clinical Impression  Patient is presenting with clinical s/s of dysphagia as per this bedside swallow evaluation which is consistent with MBS completed in February. During this bedside swallow evaluation, patient with immediate coughing with straw sips of thin liquids but with only mild, intermittent throat clearing with cup sips of thin liquids,even when taking pills (whole,  one at a time) with thin liquids (water). SLP discussed options with patient and daughter and all in agreement for repeat MBS to determine if any change in swallow function. SLP Visit Diagnosis: Dysphagia, unspecified (R13.10)     Aspiration Risk  Mild aspiration risk    Diet Recommendation Dysphagia 3 (Mech soft);Thin liquid    Liquid Administration via: Cup;No straw Medication Administration: Whole meds with liquid Supervision: Patient able to self feed Compensations: Slow rate;Small sips/bites Postural Changes: Seated upright at 90 degrees;Remain upright for at least 30 minutes after po intake    Other  Recommendations Oral Care Recommendations: Oral care BID    Recommendations for follow up therapy are one component of a multi-disciplinary discharge planning process, led by the attending physician.  Recommendations may be updated based on patient status, additional functional criteria and insurance authorization.  Follow up Recommendations Other (comment) (TBD pending MBS results)      Assistance Recommended at Discharge    Functional Status Assessment Patient has had a recent decline in their functional status and demonstrates the ability to make significant improvements in function in a reasonable and predictable amount of time.  Frequency and Duration min 1 x/week  1 week       Prognosis Prognosis for improved oropharyngeal function: Good      Swallow Study   General Date of Onset: 05/07/23 HPI: Patient is a 72 y.o. male with PMH: GERD, HTN, HLD, thyroid disease, multiple myeloma, large hiatal hernia, dysphagia. He had an MBS at a different hospital with SLP reporting penetration with thin liquids via straw sips but not with cup sips. He presented to the hospital on 05/05/23 after being found down  in his bed with gurgling respirations, emesis and was suctioned for large amount of gastric contents; he was hypoxemic and found to have aspiration PNA. Type of Study: Bedside Swallow Evaluation Previous Swallow Assessment: at different hospital (MBS) Diet Prior to this Study: Dysphagia 3 (mechanical soft);Thin liquids (Level 0) Temperature Spikes Noted: No Respiratory Status: Nasal cannula History of  Recent Intubation: No Behavior/Cognition: Alert;Cooperative;Pleasant mood Oral Cavity Assessment: Within Functional Limits Oral Care Completed by SLP: No Oral Cavity - Dentition: Adequate natural dentition Vision: Functional for self-feeding Self-Feeding Abilities: Able to feed self Patient Positioning: Upright in bed Baseline Vocal Quality: Normal Volitional Cough: Strong Volitional Swallow: Able to elicit    Oral/Motor/Sensory Function Overall Oral Motor/Sensory Function: Within functional limits   Ice Chips     Thin Liquid Thin Liquid: Impaired Presentation: Straw;Cup;Self Fed Pharyngeal  Phase Impairments: Cough - Immediate;Throat Clearing - Delayed    Nectar Thick Nectar Thick Liquid: Not tested   Honey Thick Honey Thick Liquid: Not tested   Puree Puree: Not tested   Solid     Solid: Impaired (pills taken whole with water) Pharyngeal Phase Impairments: Throat Clearing - Delayed     Angela Nevin, MA, CCC-SLP Speech Therapy

## 2023-05-08 NOTE — Plan of Care (Signed)
  Problem: Safety: Goal: Ability to remain free from injury will improve Outcome: Progressing   

## 2023-05-08 NOTE — Assessment & Plan Note (Signed)
Patient has an history of chronic pancytopenia secondary to CAR-T therapy with Carvykti. Hemoglobin improved to 8.7 today, s/p 1 unit of PRBC, platelet 25. Oncology was consulted-Per secure chat they advise keeping the hemoglobin above 8 and platelet transfusion if platelet drops below 20. They also want to to check IgG levels and if low they will give another IGIV infusion, apparently patient gets monthly IVIG's. -Monitor CBC closely -Follow-up IgG labs

## 2023-05-08 NOTE — Assessment & Plan Note (Signed)
Pseudomonas aeruginosa bacteremia. Patient met sepsis criteria with fever, leukocytosis, blood cultures positive for Pseudomonas aeruginosa, good sensitivity.  Initially received broad-spectrum antibiotics with cefepime and vancomycin, vancomycin was later discontinued. Repeat blood cultures today. -Continue with cefepime -ID consult for type and duration of antibiotics based on his immunocompromise status

## 2023-05-08 NOTE — Progress Notes (Signed)
Physical Therapy Treatment Patient Details Name: Andre [name] MRN: [id number] DOB: [birth year] Today's Date: 05/08/2023   History of Present Illness Pt is a 71yo male who present to ED after being found down in his bed with gurgling respirations, + emesis, and  suctioned for large amount of gastric contents. Pt was hypoxemic. Found to have apiration pneumonia PMH: mulitple myeloma, stem cell transplant, CAR-T therapy in January-slow response to treatment, thyroid disease, HTN, GERD, duodenal ulcer, depression    PT Comments  Pt with improved ambulation distance this date however required minA several time to prevent fall due to LOB. Pt with noted L knee buckling and poor walker management once distance >60'.  Spoke with dtr and patient regarding continued PT recommendation of SNF upon d/c as pt was mod I PTA with exception of management of medicine and driving. Pt remains at increased falls risk and requires minA for mobility. Pt to benefit from ST-SNF to achieve safe mod I mobility for safe transition back to ALF apartment. Pt and daughter agree. Acute PT to cont to follow.   If plan is discharge home, recommend the following: A little help with walking and/or transfers;A little help with bathing/dressing/bathroom;Assistance with cooking/housework;Direct supervision/assist for medications management;Direct supervision/assist for financial management;Assist for transportation   Can travel by private vehicle     Yes  Equipment Recommendations  None recommended by PT    Recommendations for Other Services       Precautions / Restrictions Precautions Precautions: Fall Precaution Comments: watch SpO2 Restrictions Weight Bearing Restrictions: No     Mobility  Bed Mobility Overal bed mobility: Needs Assistance Bed Mobility: Supine to Sit     Supine to sit: Min assist, HOB elevated     General bed mobility comments: increased time, HOB elevated, minA for trunk elevation inititation,  pt then able to use bilat UEs to scoot forward to EOB    Transfers Overall transfer level: Needs assistance Equipment used: Rolling walker (2 wheels) Transfers: Sit to/from Stand Sit to Stand: Contact guard assist           General transfer comment: verbal cues for safe hand placement, increased time, 2nd stand better than first, verbal cues to lean forward    Ambulation/Gait Ambulation/Gait assistance: Contact guard assist, Min assist Gait Distance (Feet): 370 Feet Assistive device: Rolling walker (2 wheels) Gait Pattern/deviations: Step-through pattern, Decreased stride length, Trunk flexed Gait velocity: dec but then became impulsively fast requiring verbal cues to decrease cadance     General Gait Details: pt with reciprocal gait pattern, with onset of fatigue pt would push RW to far forward requiring minA for walker management and to step up in walker and stand up straight. Pt with noted L knee buckling at times requiring minA to prevent fall. Pt with decreased spacial awareness running PT into wall on L side   Stairs             Wheelchair Mobility     Tilt Bed    Modified Rankin (Stroke Patients Only)       Balance Overall balance assessment: Needs assistance Sitting-balance support: Feet supported, Bilateral upper extremity supported Sitting balance-Leahy Scale: Fair     Standing balance support: Bilateral upper extremity supported, During functional activity, Reliant on assistive device for balance Standing balance-Leahy Scale: Poor Standing balance comment: benefits from RW  Cognition Arousal: Alert Behavior During Therapy: Flat affect Overall Cognitive Status: Within Functional Limits for tasks assessed                                 General Comments: dtr present and reports this to be pretty close to baseline, dtr reports memory deficits and difficulty with IADLs like medicine management         Exercises      General Comments General comments (skin integrity, edema, etc.): SpO2 >92% on 2LO2 via [place], however poor reading during ambulation      Pertinent Vitals/Pain Pain Assessment Pain Assessment: No/denies pain    Home Living                          Prior Function            PT Goals (current goals can now be found in the care plan section) Acute Rehab PT Goals PT Goal Formulation: With patient Time For Goal Achievement: 05/21/23 Potential to Achieve Goals: Good Progress towards PT goals: Progressing toward goals    Frequency    Min 1X/week      PT Plan      Co-evaluation              AM-PAC PT "6 Clicks" Mobility   Outcome Measure  Help needed turning from your back to your side while in a flat bed without using bedrails?: A Lot Help needed moving from lying on your back to sitting on the side of a flat bed without using bedrails?: A Little Help needed moving to and from a bed to a chair (including a wheelchair)?: A Little Help needed standing up from a chair using your arms (e.g., wheelchair or bedside chair)?: A Little Help needed to walk in hospital room?: A Lot Help needed climbing 3-5 steps with a railing? : A Lot 6 Click Score: 15    End of Session Equipment Utilized During Treatment: Gait belt;Oxygen Activity Tolerance: Patient tolerated treatment well Patient left: in chair;with call bell/phone within reach;with chair alarm set;with family/visitor present Nurse Communication: Mobility status PT Visit Diagnosis: Unsteadiness on feet (R26.81);Muscle weakness (generalized) (M62.81)     Time: 9518-8416 PT Time Calculation (min) (ACUTE ONLY): 35 min  Charges:    $Gait Training: 23-37 mins PT General Charges $$ ACUTE PT VISIT: 1 Visit                     Lewis Shock, PT, DPT Acute Rehabilitation Services Secure chat preferred Office #: [phone]    Iona Hansen 05/08/2023, 12:43 PM

## 2023-05-08 NOTE — Progress Notes (Signed)
Progress Note   Patient: Andre [name] YNW:[id number] DOB: 1951-06-08 DOA: 05/05/2023     3 DOS: the patient was seen and examined on 05/08/2023   Brief hospital course: PCCM transfer for 05/07/2023.  Taken from prior notes.  72 year old gentleman with past medical history significant for multiple myeloma s/p stem cell transplant, prior CAR-TEE in January with Carvykti, history of pancytopenia, hypertension and hypothyroidism admitted on 05/05/2023 with concern of altered mental status.  He was found down in his bed with gurgling respiration, had an emesis which was suctioned for large amount of gastric contents.  He was significantly hypoxic by EMS.  Initially admitted with concern of sepsis, blood cultures positive for Pseudomonas aeruginosa, initially received broad-spectrum antibiotics with cefepime and vancomycin and later cefepime was continued.  8/19: Vitals with mild tachypnea, on 4 L of oxygen.  No baseline oxygen use, CBC with worsening hemoglobin at 7.4, platelets 21.  Patient has an history of Hemoccult positive stool, have recently saw GI, they were planning EGD and colonoscopy if medically stable.  Giving 1 unit of blood and consulting oncology for worsening thrombocytopenia, oncology recommended checking IgG and if level is low they will give him another dose of IVIG, apparently getting monthly IVIG's infusion. PT and OT are recommending SNF  8/20: Vital stable, hemoglobin at 8.7 s/p 1 unit of PRBC, platelets 25, IgG labs pending.  Initial blood cultures with Pseudomonas aeruginosa-good sensitivity. Repeating blood culture.  Swallow team is recommending barium swallow studies. ID was consulted to help with type and duration of antibiotic due to his immunocompromise status.  Assessment and Plan: * Acute hypoxemic respiratory failure (HCC) Likely secondary to aspiration pneumonia versus pneumonitis.  CT chest with bibasilar atelectasis.  No baseline oxygen use. -Continue with  supplemental oxygen-wean as tolerated  Sepsis (HCC) Pseudomonas aeruginosa bacteremia. Patient met sepsis criteria with fever, leukocytosis, blood cultures positive for Pseudomonas aeruginosa, good sensitivity.  Initially received broad-spectrum antibiotics with cefepime and vancomycin, vancomycin was later discontinued. Repeat blood cultures today. -Continue with cefepime -ID consult for type and duration of antibiotics based on his immunocompromise status   Pancytopenia (HCC) Patient has an history of chronic pancytopenia secondary to CAR-T therapy with Carvykti. Hemoglobin improved to 8.7 today, s/p 1 unit of PRBC, platelet 25. Oncology was consulted-Per secure chat they advise keeping the hemoglobin above 8 and platelet transfusion if platelet drops below 20. They also want to to check IgG levels and if low they will give another IGIV infusion, apparently patient gets monthly IVIG's. -Monitor CBC closely -Follow-up IgG labs  Multiple myeloma not having achieved remission [facility]) S/p stem cell transplant in [place].  Currently getting CRT-T therapy. Oncology was consulted-they will see the patient. -Follow-up oncology recommendations  Hypothyroidism -Continue home Synthroid  GERD (gastroesophageal reflux disease) -Continue home Protonix  Physical deconditioning PT and OT are recommending SNF   Subjective: Patient was seen and examined today.  He thinks he is improving, no new concern.  Physical Exam: Vitals:   05/08/23 0844 05/08/23 0900 05/08/23 1000 05/08/23 1100  BP:  (!) 140/70  106/62  Pulse: 67 63 65 62  Resp: 17 (!) 21  17  Temp: 98 F (36.7 C)   97.6 F (36.4 C)  TempSrc: Oral   Oral  SpO2: 94% 95% 95% 93%  Weight:       General.  Frail elderly man, in no acute distress. Pulmonary.  Few scattered rhonchi bilaterally, normal respiratory effort. CV.  Regular rate and rhythm, no JVD, rub or  murmur. Abdomen.  Soft, nontender, nondistended, BS positive. CNS.   Alert and oriented .  No focal neurologic deficit. Extremities.  No edema, no cyanosis, pulses intact and symmetrical. Psychiatry.  Judgment and insight appears normal.   Data Reviewed: Prior data reviewed.  Family Communication: Discussed with daughter at bedside  Disposition: Status is: Inpatient Remains inpatient appropriate because: Severity of illness  Planned Discharge Destination: Skilled nursing facility  Time spent: 50 minutes  This record has been created using Conservation officer, historic buildings. Errors have been sought and corrected,but may not always be located. Such creation errors do not reflect on the standard of care.   Author: Arnetha Courser, MD 05/08/2023 12:03 PM  For on call review [email or web address].

## 2023-05-08 NOTE — Progress Notes (Signed)
Mobility Specialist Progress Note:   05/08/23 1622  Mobility  Activity Ambulated with assistance in hallway  Level of Assistance Contact guard assist, steadying assist  Assistive Device Front wheel walker  Distance Ambulated (ft) 400 ft  Activity Response Tolerated well  Mobility Referral Yes  $Mobility charge 1 Mobility  Mobility Specialist Start Time (ACUTE ONLY) 1440  Mobility Specialist Stop Time (ACUTE ONLY) 1458  Mobility Specialist Time Calculation (min) (ACUTE ONLY) 18 min   Received pt in bed having no complaints and agreeable to mobility. Pt was asymptomatic throughout ambulation and returned to room w/o fault. Pt had to be reminded to maintain walker management. Left in bed w/ call bell in reach and all needs met.  Pre Mobility HR 67 Post Mobility HR 73 SPO2 2L/min 92%  Thompson Grayer Mobility Specialist  Please contact vis Secure Chat or  Rehab Office [phone]

## 2023-05-08 NOTE — Assessment & Plan Note (Signed)
Likely secondary to aspiration pneumonia versus pneumonitis.  CT chest with bibasilar atelectasis.  No baseline oxygen use. -Continue with supplemental oxygen-wean as tolerated

## 2023-05-08 NOTE — Progress Notes (Signed)
IP PROGRESS NOTE  Subjective:   Andre [name] is known to me with a history of multiple myeloma, status post CAR-T therapy.  He has persistent pancytopenia following CAR-T therapy. He presented to the emergency room with respiratory distress.  He was noted to have emesis with suctioning of a large amount of gastric contents.  He was diagnosed with aspiration pneumonia. A blood culture returned positive for Pseudomonas.  He was placed on cefepime. A CT chest revealed right lung base atelectasis.  No infiltrate  Andre [name] has no complaint this morning.  He does not recall the events surrounding hospital admission.. Objective: Vital signs in last 24 hours: Blood pressure (!) 151/60, pulse (!) 55, temperature 98.9 F (37.2 C), temperature source Oral, resp. rate 16, weight 179 lb 7.3 oz (81.4 kg), SpO2 94%.  Intake/Output from previous day: 08/19 0701 - 08/20 0700 In: 1175 [I.V.:412.9; Blood:436.7; IV Piggyback:325.5] Out: 2500 [Urine:2500]  Physical Exam:  HEENT: No thrush Lungs: Decreased breath sounds at the lower posterior chest, no respiratory distress Cardiac: Regular rate and rhythm Abdomen: No hepatosplenomegaly Extremities: No leg edema Neurologic: Alert, follows commands, oriented to place  Portacath/PICC-without erythema  Lab Results: Recent Labs    05/07/23 0720 05/07/23 2018 05/08/23 0107  WBC 3.8*  --  3.1*  HGB 7.4* 9.3* 8.7*  HCT 23.5* 28.6* 25.8*  PLT 21*  --  25*    BMET Recent Labs    05/07/23 0720 05/08/23 0107  NA 137 135  K 4.2 3.6  CL 104 105  CO2 24 24  GLUCOSE 98 94  BUN 15 16  CREATININE 0.87 1.04  CALCIUM 9.0 8.8*    No results found for: "CEA1", "CEA", "WUX324", "CA125"  Studies/Results: No results found.  Medications: I have reviewed the patient's current medications.  Assessment/Plan: Multiple myeloma- IgG lambda  bortezomib (subcutaneously), lenalidomide, and dexamethasone, with repeat bone marrow biopsy May of 2012  showing 10% plasmacytosis   (2) high-dose chemotherapy with BCNU and melphalan at [facility], followed by stem cell rescue July of 2012   (3) on zoledronic acid started December of 2012, initially monthly, currently given every 3 months, most recent dose  12/07/2015   (4) low-dose lenalidomide resumed April 2013, interrupted several times.  Resumed again on 02/19/2013, ata dose of 5 mg daily, 21 days on and 7 days off, later further reduced to 7 days on, 7 days off   (5) CNS symptoms and abnormal brain MRI extensively evaluated by neurology with no definitive diagnosis established   (6) rising M spike noted June 2014 but did not meet criteria for carfilzomib study   (7) on lenalidomide 25 mg daily, 14 days on, 7 days off, starting 04/18/2013, interrupted December 2014 because of rash;             (a) resumed January 2015 at 10 mg/ day at 21 days on/ 7 days off             (b) starting 08/18/2014 decreased to 10 mg/ day 14 days on, 7 days off because of cytopenias             (c) as of February 2016 was on 5 mg daily 7 days on 7 days off             (d) lenalidomide discontinued December 2016 with evidence of disease progression   (8) transient global amnesia 05/29/2015, resolved without intervention   (9) starting PVD 10/25/2015 w ASA 325 thromboprophylaxis, valacyclovir prophylaxis, last dose 12/17/2015             (  a) pomalidomide 4 mg/d days 1-14             (b) bortezomib sQ days 2,5,9,12 of each 21 day cycle             (c) dexamethasome 20 mg two days a week             (d) dexamethasone bortezomib and pomalidomide discontinued late December 2018 with poor tolerance   (10) metapneumovirus pneumonia April 2017             (a) completing course of steroids and week of bactrim mid April 2017   (11) status post second autologous transplant at [facility] 02/04/2016(preparatory regimen melphalan 200 mg/m)             (a) received twelve-month vaccinations 03/14/2017 (DPT, Haemophilus, Pneumovax 13,  polio)             (b) 14 months injections 05/04/2016 include DTaP, Hib conjugate, HepB energex B 20 mcg/ml, Prevnar 13             (c) 24 month vaccines due at [facility] June 2019   (12) maintenance therapy started November 2017, consisting of             (a) bortezomib 1.3 mg/M2 every 14 days, first dose 07/27/2016             (b) pomalidomide 1 mg days 1-21 Q28 days, started 07/19/2016             (c) zolendronate monthly started 07/27/2016 (previously Q 3 months) however patient unable to tolerate, and changed back to q3 months in April, 2018             (d) Bortezomib changed to monthly as of June 2018 because of tolerance issues, however discontinued after 09/14/2017 dose because of a rise in his M spike             (e) pomalidomide held after 10/18/2017 in preparation for possible study at [facility] (venetoclax)--never resumed             (f) with numbers actually improved off treatment, resumed every 2-week bortezomib 12/25/2017             (g) changed to every 3-week bortezomib as of 03/17/2019             (h) briefly on weekly treatments times 22 October 2020 due to increase in M spike             (I) maintenance therapy discontinued with evidence of progression   (13) bortezomib/daratumumab/dexamethasone started 12/20/2020.             (a) Decadron dose dropped to 10 mg day of and day following treatment starting May 2022             (b) day 8 bortezomib omitted beginning with the June cycle per patient preference             (C) treatment changed to monthly daratumumab/Velcade/Decadron beginning 06/27/2021-patient decision (14) weekly Velcade/Cytoxan beginning 11/14/2021 (15) changed to Cytoxan/carfilzomib/Decadron 12/19/2021-treatment placed on hold 05/15/2022, carfilzomib 05/24/2022 and 05/31/2022 16.  Leukocyte a pheresis procedure at [facility] 06/15/2022 17.  Treatment resumed with Cytoxan/carfilzomib/Decadron 06/28/2022, last given 09/06/2022 18.  CAR-T 09/26/2022 with Carvykti 19.  CRS  and ICANS following CAR-T therapy, status post a Decadron taper completed 10/08/2022 20.  Presentation to the emergency room 10/30/2022 with failure to thrive and a fall  21.  Pancytopenia secondary to multiple myeloma and CAR-T  therapy-improved 22.  Bone marrow biopsy 11/13/2022-mildly hypocellular bone marrow with relative erythroid hyperplasia and decreased megakaryocytes; no plasma cells identified by differential count or CD138 immunohistochemical stain; flow cytometry negative for a clonal plasma cell population.  23.  Cognitive impairment predating CAR-T therapy and worsened following CAR-T therapy 24.  Admission 05/05/2023 with Pseudomonas sepsis CTs chest, abdomen, and pelvis-right lung base atelectasis, no infiltrate   Andre [name] has multiple myeloma.  He underwent CAR-T therapy in January.  He is in remission from myeloma, but has persistent pancytopenia.  He has hypogammaglobulinemia and receives monthly IVIG therapy at the cancer center.  He is admitted with Pseudomonas bacteremia/sepsis.  No clear source for infection has been identified.  The differential diagnosis includes pneumonia, a urinary tract infection, and a Port-A-Cath infection.  Repeat blood cultures are negative.  He has persistent pancytopenia following CAR-T therapy.  He is scheduled for a diagnostic bone marrow biopsy 05/14/2023.  We can schedule the bone marrow biopsy over the next few days if his clinical status remains improved.  Recommendations: Continue antibiotics, management of aspiratory failure per the medical service Check white cell differential Transfuse platelets for bleeding or a count of less than 10,000 Proceed with diagnostic bone marrow biopsy later this week if he remains hospitalized Check urinalysis and culture I will check on him later this week and outpatient follow-up will be scheduled at the Cancer center   LOS: 3 days   Thornton Papas, MD   05/08/2023, 7:10 AM

## 2023-05-08 NOTE — [place] FL2 (Signed)
[place] MEDICAID FL2 LEVEL OF CARE FORM     IDENTIFICATION  Patient Name: Andre [name] Birthdate: 24-Jun-1951 Sex: male Admission Date (Current Location): 05/05/2023  [facility] and [place] Number:  Producer, television/film/video and Address:  [facility], [address] [place], [place] [address]      Provider Number: [id number]  Attending Physician Name and Address:  Arnetha Courser, MD  Relative Name and Phone Number:  [name] (spouse) [phone]    Current Level of Care: SNF Recommended Level of Care: Skilled Nursing Facility Prior Approval Number:    Date Approved/Denied:   PASRR Number: [id number]  Discharge Plan:      Current Diagnoses: Patient Active Problem List   Diagnosis Date Noted   Acute hypoxemic respiratory failure (HCC) 05/05/2023   Occult blood in stools 05/02/2023   Hypokalemia 11/13/2022   Reduced mobility 10/13/2022   Self-care deficit 10/13/2022   Physical deconditioning 10/02/2022   History of engineered cell therapy infusion 09/26/2022   Meteorism 02/09/2022   Memory problem 08/30/2021   Accidental fall 02/19/2021   Periorbital contusion of right eye 02/19/2021   Contusion of right hand 02/19/2021   Hemorrhoids 11/20/2019   Neuropathy due to chemotherapeutic drug (HCC) 09/01/2019   Leukopenia due to antineoplastic chemotherapy (HCC) 02/22/2017   Dry skin dermatitis 08/16/2016   Peripheral edema 04/26/2016   Bone marrow transplant status (HCC) 03/14/2016   Debility 02/26/2016   Back pain 02/26/2016   Febrile neutropenia (HCC) 02/15/2016   Patient in clinical research study 01/10/2016   Rash and nonspecific skin eruption 01/06/2016   HCAP (healthcare-associated pneumonia)    GERD (gastroesophageal reflux disease)    Dehydration 12/22/2015   Weakness 12/22/2015   Asthma exacerbation 12/22/2015   Hyponatremia 12/22/2015   General weakness    Multiple myeloma not having achieved remission (HCC)    Sepsis (HCC)     Chronic anemia    SBO (small bowel obstruction) (HCC) 11/07/2015   Constipation    Actinic keratosis 07/08/2015   Transient global amnesia 06/07/2015   Diarrhea 09/17/2014   Raynaud's phenomenon 09/17/2013   Hemorrhage of rectum and anus 09/15/2013   Skin lesion, superficial 07/29/2013   Cough 01/10/2013   Abnormal brain MRI 10/08/2012   Vitamin D deficiency 09/30/2012   Hypothyroidism 09/30/2012   Hypogonadism male 09/30/2012   Fatigue 09/27/2012   Insomnia 09/27/2012   Reactive depression (situational) 09/27/2012   Goals of care, counseling/discussion 09/27/2012   TIA (transient ischemic attack) 03/27/2012   Transient diplopia 03/27/2012   Pancytopenia (HCC) 03/27/2012   HYPERLIPIDEMIA 05/30/2010   Anemia of chronic disease 05/30/2010   Asthma 05/30/2010   SCHATZKI'S RING 05/30/2010   Peptic ulcer disease 05/30/2010   HIATAL HERNIA 05/30/2010   SLEEP APNEA, MILD 05/30/2010   TRANSAMINASES, SERUM, ELEVATED 05/30/2010    Orientation RESPIRATION BLADDER Height & Weight     Self, Time, Situation, Place  O2 (Nasal Cannula 2 liters) Incontinent, External catheter (External Urinary Catheter) Weight: 179 lb 7.3 oz (81.4 kg) Height:     BEHAVIORAL SYMPTOMS/MOOD NEUROLOGICAL BOWEL NUTRITION STATUS        Diet (Please see discharge summary)  AMBULATORY STATUS COMMUNICATION OF NEEDS Skin   Limited Assist Verbally Other (Comment) (Ecchymosis,back,sacrum,Right,Lower,Wound/Incision LDAs)                       Personal Care Assistance Level of Assistance  Bathing, Feeding, Dressing Bathing Assistance: Limited assistance Feeding assistance: Independent Dressing Assistance: Limited assistance  Functional Limitations Info  Sight, Hearing, Speech Sight Info: Adequate Hearing Info: Adequate Speech Info: Adequate    SPECIAL CARE FACTORS FREQUENCY  PT (By licensed PT), OT (By licensed OT)     PT Frequency: 5x min weekly OT Frequency: 5x min weekly             Contractures Contractures Info: Not present    Additional Factors Info  Code Status, Allergies, Psychotropic, Insulin Sliding Scale Code Status Info: DNR Allergies Info: Atorvastatin,Rosuvastatin,Crestor (rosuvastatin Calcium),Lipitor (atorvastatin Calcium),Septra (sulfamethoxazole-trimethoprim) Psychotropic Info: escitalopram (LEXAPRO) tablet 10 mg daily Insulin Sliding Scale Info: insulin aspart (novoLOG) injection 0-6 Units every 4 hours       Current Medications (05/08/2023):  This is the current hospital active medication list Current Facility-Administered Medications  Medication Dose Route Frequency Provider Last Rate Last Admin   0.9 %  sodium chloride infusion (Manually program via Guardrails IV Fluids)   Intravenous Once Arnetha Courser, MD       albuterol (PROVENTIL) (2.5 MG/3ML) 0.083% nebulizer solution 2.5 mg  2.5 mg Nebulization Q2H PRN Olalere, Adewale A, MD   2.5 mg at 05/06/23 1747   calcium carbonate (OS-CAL - dosed in mg of elemental calcium) tablet 1,250 mg  1 tablet Oral Q breakfast Mosetta Anis, RPH   1,250 mg at 05/08/23 2130   ceFEPIme (MAXIPIME) 2 g in sodium chloride 0.9 % 100 mL IVPB  2 g Intravenous Q8H Mosetta Anis, [facility]   Stopped at 05/08/23 8657   Chlorhexidine Gluconate Cloth 2 % PADS 6 each  6 each Topical Daily Olalere, Adewale A, MD   6 each at 05/08/23 8469   cyanocobalamin (VITAMIN B12) tablet 1,000 mcg  1,000 mcg Oral Daily Arnetha Courser, MD   1,000 mcg at 05/08/23 6295   dapsone tablet 100 mg  100 mg Oral Daily Arnetha Courser, MD   100 mg at 05/08/23 2841   docusate sodium (COLACE) capsule 100 mg  100 mg Oral BID PRN Olalere, Adewale A, MD       escitalopram (LEXAPRO) tablet 10 mg  10 mg Oral Daily Arnetha Courser, MD   10 mg at 05/08/23 3244   feeding supplement (ENSURE ENLIVE / ENSURE PLUS) liquid 237 mL  237 mL Oral BID BM Arnetha Courser, MD   237 mL at 05/08/23 1507   ferrous sulfate tablet 325 mg  325 mg Oral BID Arnetha Courser, MD   325 mg  at 05/08/23 0102   folic acid (FOLVITE) tablet 1 mg  1 mg Oral Daily Arnetha Courser, MD   1 mg at 05/08/23 7253   insulin aspart (novoLOG) injection 0-6 Units  0-6 Units Subcutaneous Q4H Henry Russel, MD       levothyroxine (SYNTHROID) tablet 125 mcg  125 mcg Oral Daily Arnetha Courser, MD   125 mcg at 05/08/23 6644   melatonin tablet 3 mg  3 mg Oral QHS Paliwal, Aditya, MD   3 mg at 05/07/23 2308   methylphenidate (RITALIN) tablet 2.5 mg  2.5 mg Oral Daily Arnetha Courser, MD   2.5 mg at 05/08/23 0942   pantoprazole (PROTONIX) EC tablet 40 mg  40 mg Oral Daily Arnetha Courser, MD   40 mg at 05/08/23 0347   polyethylene glycol (MIRALAX / GLYCOLAX) packet 17 g  17 g Oral Daily PRN Olalere, Adewale A, MD       traZODone (DESYREL) tablet 100 mg  100 mg Oral QHS PRN Conrad Munhall, MD       valACYclovir (VALTREX) tablet  500 mg  500 mg Oral Daily Arnetha Courser, MD   500 mg at 05/08/23 4782     Discharge Medications: Please see discharge summary for a list of discharge medications.  Relevant Imaging Results:  Relevant Lab Results:   Additional Information [id number]  Delilah Shan, LCSWA

## 2023-05-08 NOTE — TOC Progression Note (Addendum)
Transition of Care [facility]) - Progression Note    Patient Details  Name: Andre [name] MRN: [id number] Date of Birth: 1951-03-27  [facility] CM/SW Contact  Delilah Shan, LCSWA Phone Number: 05/08/2023, 2:45 PM  Clinical Narrative:     CSW spoke with patients spouse regarding dc plan.  CSW informed patients spouse that CSW spoke with Lupita Leash at [facility] who informed CSW that patient will need to get some rehab before returning back to ALF. Lupita Leash informed CSW that patient can receive rehab at [facility] . CSW informed patients spouse. Patients spouse would like for CSW to fax out initial referral for possible SNF placement. CSW faxed out referral. CSW awaiting SNF bed offers. Patients spouse would like to see what rehab offers patient gets before making decision on if she wants patient to receive rehab at [facility].CSW will continue to follow.    Expected Discharge Plan:  (TBD) Barriers to Discharge: Continued Medical Work up  Expected Discharge Plan and Services In-house Referral: Clinical Social Work     Living arrangements for the past 2 months: Assisted Living Facility Interior and spatial designer)                                       Social Determinants of Health (SDOH) Interventions SDOH Screenings   Food Insecurity: Low Risk  (10/13/2022)   Received from [facility]  Transportation Needs: No Transportation Needs (11/17/2022)  Utilities: Low Risk  (10/13/2022)   [facility]  Depression (PHQ2-9): Low Risk  (06/08/2022)  Tobacco Use: Medium Risk (05/02/2023)    Readmission Risk Interventions     No data to display

## 2023-05-08 NOTE — Assessment & Plan Note (Signed)
S/p stem cell transplant in [place].  Currently getting CRT-T therapy. Oncology was consulted-they will see the patient. -Follow-up oncology recommendations

## 2023-05-09 ENCOUNTER — Inpatient Hospital Stay (HOSPITAL_COMMUNITY): Payer: Medicare Other

## 2023-05-09 ENCOUNTER — Inpatient Hospital Stay: Payer: Medicare Other | Admitting: Oncology

## 2023-05-09 ENCOUNTER — Inpatient Hospital Stay: Payer: Medicare Other

## 2023-05-09 DIAGNOSIS — J9601 Acute respiratory failure with hypoxia: Secondary | ICD-10-CM | POA: Diagnosis not present

## 2023-05-09 LAB — CBC
HCT: 27 % — ABNORMAL LOW (ref 39.0–52.0)
Hemoglobin: 8.9 g/dL — ABNORMAL LOW (ref 13.0–17.0)
MCH: 36.3 pg — ABNORMAL HIGH (ref 26.0–34.0)
MCHC: 33 g/dL (ref 30.0–36.0)
MCV: 110.2 fL — ABNORMAL HIGH (ref 80.0–100.0)
Platelets: 30 10*3/uL — ABNORMAL LOW (ref 150–400)
RBC: 2.45 MIL/uL — ABNORMAL LOW (ref 4.22–5.81)
RDW: 24.4 % — ABNORMAL HIGH (ref 11.5–15.5)
WBC: 2.3 10*3/uL — ABNORMAL LOW (ref 4.0–10.5)
nRBC: 0 % (ref 0.0–0.2)

## 2023-05-09 LAB — IGG: IgG (Immunoglobin G), Serum: 455 mg/dL — ABNORMAL LOW (ref 603–1613)

## 2023-05-09 LAB — GLUCOSE, CAPILLARY
Glucose-Capillary: 106 mg/dL — ABNORMAL HIGH (ref 70–99)
Glucose-Capillary: 88 mg/dL (ref 70–99)

## 2023-05-09 MED ORDER — CIPROFLOXACIN HCL 500 MG PO TABS
750.0000 mg | ORAL_TABLET | Freq: Two times a day (BID) | ORAL | Status: DC
  Administered 2023-05-09 – 2023-05-11 (×5): 750 mg via ORAL
  Filled 2023-05-09 (×5): qty 2

## 2023-05-09 NOTE — Care Management Important Message (Signed)
Important Message  Patient Details  Name: Andre [name] MRN: [id number] Date of Birth: 31-May-1951   Medicare Important Message Given:  Yes     Renie Ora 05/09/2023, 8:12 AM

## 2023-05-09 NOTE — Progress Notes (Signed)
PROGRESS NOTE    Andre [name]  BJY:[id number] DOB: 1951-07-17 DOA: 05/05/2023 PCP: [name], MD   Brief Narrative:  72 year old gentleman with past medical history significant for multiple myeloma s/p stem cell transplant, prior CAR-TEE in January with Carvykti, history of pancytopenia, hypertension and hypothyroidism admitted on 05/05/2023 with concern of altered mental status.  He was found down in his bed with gurgling respiration, had an emesis which was suctioned for large amount of gastric contents.  He was significantly hypoxic by EMS.   Initially admitted with concern of sepsis, blood cultures positive for Pseudomonas aeruginosa, initially received broad-spectrum antibiotics with cefepime and vancomycin and later cefepime was continued.   8/19: Vitals with mild tachypnea, on 4 L of oxygen.  No baseline oxygen use, CBC with worsening hemoglobin at 7.4, platelets 21.  Patient has an history of Hemoccult positive stool, have recently saw GI, they were planning EGD and colonoscopy if medically stable.  Giving 1 unit of blood and consulting oncology for worsening thrombocytopenia, oncology recommended checking IgG and if level is low they will give him another dose of IVIG, apparently getting monthly IVIG's infusion. PT and OT are recommending SNF   8/20: Vital stable, hemoglobin at 8.7 s/p 1 unit of PRBC, platelets 25, IgG labs pending.  Initial blood cultures with Pseudomonas aeruginosa-good sensitivity. Repeating blood culture.  Swallow team is recommending barium swallow studies. ID was consulted to help with type and duration of antibiotic due to his immunocompromise status.  Assessment & Plan:   Principal Problem:   Acute hypoxemic respiratory failure (HCC) Active Problems:   Sepsis (HCC)   Pancytopenia (HCC)   Multiple myeloma not having achieved remission (HCC)   Hypothyroidism   GERD (gastroesophageal reflux disease)   Physical deconditioning  Acute hypoxemic  respiratory failure (HCC) Likely secondary to aspiration pneumonia versus pneumonitis.  CT chest with bibasilar atelectasis.  No baseline oxygen use. -Continue with supplemental oxygen-wean as tolerated. I have now ordered incentive spirometry.   Sepsis (HCC) / Pseudomonas aeruginosa bacteremia. Patient met sepsis criteria with fever, leukocytosis, blood cultures positive for Pseudomonas aeruginosa, good sensitivity.  Initially received broad-spectrum antibiotics with cefepime and vancomycin, vancomycin was later discontinued.  Repeat blood culture from 05/08/2023 negative so far.  Seen by ID, they recommended total 7 days of antibiotics and transition to ciprofloxacin for 4 more days on 05/09/2023.   Pancytopenia (HCC)/history of multiple myeloma Patient has an history of chronic pancytopenia secondary to CAR-T therapy with Carvykti. S/p stem cell transplant in [place] Hemoglobin improved to 8.7 s/p 1 unit of PRBC, platelet 30.  Oncology on board and plan for bone marrow biopsy by end of the week if he still remains in the hospital.   Hypothyroidism -Continue home Synthroid   GERD (gastroesophageal reflux disease) -Continue home Protonix   Physical deconditioning PT and OT are recommending SNF  DVT prophylaxis: SCDs Start: 05/05/23 2313   Code Status: DNR  Family Communication:  None present at bedside.  Plan of care discussed with patient in length and he/she verbalized understanding and agreed with it.  Status is: Inpatient Remains inpatient appropriate because: Medically stable, pending placement.   Estimated body mass index is 28.04 kg/m as calculated from the following:   Height as of 05/02/23: 5\' 7"  (1.702 m).   Weight as of this encounter: 81.2 kg.    Nutritional Assessment: Body mass index is 28.04 kg/m.[name] Seen by dietician.  I agree with the assessment and plan as outlined below: Nutrition Status: Nutrition  Problem: Swallowing difficulty Etiology:  dysphagia Signs/Symptoms: other (comment) (SLP assessment) Interventions: Ensure Enlive (each supplement provides 350kcal and 20 grams of protein), Education, Refer to RD note for recommendations  . Skin Assessment: I have examined the patient's skin and I agree with the wound assessment as performed by the wound care RN as outlined below:    Consultants:  Oncology and ID  Procedures:  As above  Antimicrobials:  Anti-infectives (From admission, onward)    Start     Dose/Rate Route Frequency Ordered Stop   05/09/23 1130  ciprofloxacin (CIPRO) tablet 750 mg        750 mg Oral 2 times daily 05/09/23 1034 05/13/23 0759   05/07/23 1500  dapsone tablet 100 mg        100 mg Oral Daily 05/07/23 1403     05/07/23 1500  valACYclovir (VALTREX) tablet 500 mg        500 mg Oral Daily 05/07/23 1403     05/07/23 0900  ceFEPIme (MAXIPIME) 2 g in sodium chloride 0.9 % 100 mL IVPB  Status:  Discontinued        2 g 200 mL/hr over 30 Minutes Intravenous Every 8 hours 05/07/23 0858 05/09/23 1025   05/06/23 2300  ceFEPIme (MAXIPIME) 2 g in sodium chloride 0.9 % 100 mL IVPB  Status:  Discontinued        2 g 200 mL/hr over 30 Minutes Intravenous Every 12 hours 05/06/23 1426 05/07/23 0858   05/06/23 1015  vancomycin (VANCOREADY) IVPB 2000 mg/400 mL  Status:  Discontinued        2,000 mg 200 mL/hr over 120 Minutes Intravenous  Once 05/06/23 0915 05/06/23 0916   05/06/23 1015  ceFEPIme (MAXIPIME) 2 g in sodium chloride 0.9 % 100 mL IVPB        2 g 200 mL/hr over 30 Minutes Intravenous  Once 05/06/23 0915 05/06/23 1151   05/05/23 2200  vancomycin (VANCOREADY) IVPB 2000 mg/400 mL        2,000 mg 200 mL/hr over 120 Minutes Intravenous  Once 05/05/23 2155 05/06/23 0327   05/05/23 2145  ceFEPIme (MAXIPIME) 2 g in sodium chloride 0.9 % 100 mL IVPB        2 g 200 mL/hr over 30 Minutes Intravenous  Once 05/05/23 2143 05/05/23 2242         Subjective: Patient seen and examined.  He was fully alert and  oriented although weak.  Denied any complaint.  Objective: Vitals:   05/08/23 2338 05/09/23 0349 05/09/23 0739 05/09/23 1200  BP: 138/74 102/64 129/61 (!) 103/54  Pulse: 60 (!) 56 (!) 51 76  Resp: 18 17 16 18   Temp: 98.3 F (36.8 C) 98.6 F (37 C) 97.9 F (36.6 C) 97.7 F (36.5 C)  TempSrc: Oral Oral Oral Oral  SpO2: 94% 96% 96% 94%  Weight:  81.2 kg      Intake/Output Summary (Last 24 hours) at 05/09/2023 1306 Last data filed at 05/08/2023 2341 Gross per 24 hour  Intake 120 ml  Output 1300 ml  Net -1180 ml   Filed Weights   05/06/23 0100 05/08/23 0500 05/09/23 0349  Weight: 82.2 kg 81.4 kg 81.2 kg    Examination:  General exam: Appears calm and comfortable  Respiratory system: Scattered rhonchi bilaterally. Respiratory effort normal. Cardiovascular system: S1 & S2 heard, RRR. No JVD, murmurs, rubs, gallops or clicks. No pedal edema. Gastrointestinal system: Abdomen is nondistended, soft and nontender. No organomegaly or masses felt. Normal bowel sounds  heard. Central nervous system: Alert and oriented. No focal neurological deficits. Extremities: Symmetric 5 x 5 power. Skin: No rashes, lesions or ulcers Psychiatry: Judgement and insight appear normal. Mood & affect appropriate.    Data Reviewed: I have personally reviewed following labs and imaging studies  CBC: Recent Labs  Lab 05/05/23 1744 05/06/23 0155 05/07/23 0720 05/07/23 2018 05/08/23 0107 05/08/23 1430 05/09/23 0252  WBC 5.3 7.7 3.8*  --  3.1* 2.3* 2.3*  NEUTROABS 4.4  --   --   --   --  1.8  CANNOT DO DIFF WITHOUT CBC RESULTS  --   HGB 9.5* 8.2* 7.4* 9.3* 8.7* 9.1* 8.9*  HCT 29.2* 25.4* 23.5* 28.6* 25.8* 27.9* 27.0*  MCV 120.7* 116.0* 118.1*  --  108.4* 110.7* 110.2*  PLT 37* 26* 21*  --  25* 25* 30*   Basic Metabolic Panel: Recent Labs  Lab 05/05/23 1901 05/06/23 0155 05/07/23 0720 05/08/23 0107  NA 138  --  137 135  K 4.5  --  4.2 3.6  CL 101  --  104 105  CO2 24  --  24 24  GLUCOSE  106*  --  98 94  BUN 21  --  15 16  CREATININE 1.08 1.19 0.87 1.04  CALCIUM 9.4  --  9.0 8.8*   GFR: Estimated Creatinine Clearance: 64.5 mL/min (by C-G formula based on SCr of 1.04 mg/dL). Liver Function Tests: Recent Labs  Lab 05/05/23 1901  AST 35  ALT 29  ALKPHOS 87  BILITOT 0.5  PROT 6.3*  ALBUMIN 3.7   Recent Labs  Lab 05/05/23 1901  LIPASE 30   No results for input(s): "AMMONIA" in the last 168 hours. Coagulation Profile: No results for input(s): "INR", "PROTIME" in the last 168 hours. Cardiac Enzymes: No results for input(s): "CKTOTAL", "CKMB", "CKMBINDEX", "TROPONINI" in the last 168 hours. BNP (last 3 results) No results for input(s): "PROBNP" in the last 8760 hours. HbA1C: No results for input(s): "HGBA1C" in the last 72 hours. CBG: Recent Labs  Lab 05/08/23 1626 05/08/23 2203 05/08/23 2341 05/09/23 0349 05/09/23 0738  GLUCAP 117* 96 99 106* 88   Lipid Profile: No results for input(s): "CHOL", "HDL", "LDLCALC", "TRIG", "CHOLHDL", "LDLDIRECT" in the last 72 hours. Thyroid Function Tests: No results for input(s): "TSH", "T4TOTAL", "FREET4", "T3FREE", "THYROIDAB" in the last 72 hours. Anemia Panel: No results for input(s): "VITAMINB12", "FOLATE", "FERRITIN", "TIBC", "IRON", "RETICCTPCT" in the last 72 hours. Sepsis Labs: Recent Labs  Lab 05/05/23 1901 05/06/23 0156  LATICACIDVEN 1.7 0.9    Recent Results (from the past 240 hour(s))  Blood culture (routine x 2)     Status: Abnormal   Collection Time: 05/05/23  7:01 PM   Specimen: BLOOD RIGHT HAND  Result Value Ref Range Status   Specimen Description BLOOD RIGHT HAND  Final   Special Requests   Final    BOTTLES DRAWN AEROBIC AND ANAEROBIC Blood Culture results may not be optimal due to an inadequate volume of blood received in culture bottles   Culture  Setup Time   Final    GRAM NEGATIVE RODS AEROBIC BOTTLE ONLY CRITICAL RESULT CALLED TO, READ BACK BY AND VERIFIED WITH: PHARMD T DANG [id number]  AT 1420 BY EC Performed at [facility], [address], [place], [place] [address]    Culture PSEUDOMONAS AERUGINOSA (A)  Final   Report Status 05/08/2023 FINAL  Final   Organism ID, Bacteria PSEUDOMONAS AERUGINOSA  Final      Susceptibility  Pseudomonas aeruginosa - MIC*    CEFTAZIDIME 4 SENSITIVE Sensitive     CIPROFLOXACIN <=0.25 SENSITIVE Sensitive     GENTAMICIN 2 SENSITIVE Sensitive     IMIPENEM 1 SENSITIVE Sensitive     PIP/TAZO 8 SENSITIVE Sensitive     CEFEPIME 2 SENSITIVE Sensitive     * PSEUDOMONAS AERUGINOSA  Blood Culture ID Panel (Reflexed)     Status: Abnormal   Collection Time: 05/05/23  7:01 PM  Result Value Ref Range Status   Enterococcus faecalis NOT DETECTED NOT DETECTED Final   Enterococcus Faecium NOT DETECTED NOT DETECTED Final   Listeria monocytogenes NOT DETECTED NOT DETECTED Final   Staphylococcus species NOT DETECTED NOT DETECTED Final   Staphylococcus aureus (BCID) NOT DETECTED NOT DETECTED Final   Staphylococcus epidermidis NOT DETECTED NOT DETECTED Final   Staphylococcus lugdunensis NOT DETECTED NOT DETECTED Final   Streptococcus species NOT DETECTED NOT DETECTED Final   Streptococcus agalactiae NOT DETECTED NOT DETECTED Final   Streptococcus pneumoniae NOT DETECTED NOT DETECTED Final   Streptococcus pyogenes NOT DETECTED NOT DETECTED Final   A.calcoaceticus-baumannii NOT DETECTED NOT DETECTED Final   Bacteroides fragilis NOT DETECTED NOT DETECTED Final   Enterobacterales NOT DETECTED NOT DETECTED Final   Enterobacter cloacae complex NOT DETECTED NOT DETECTED Final   Escherichia coli NOT DETECTED NOT DETECTED Final   Klebsiella aerogenes NOT DETECTED NOT DETECTED Final   Klebsiella oxytoca NOT DETECTED NOT DETECTED Final   Klebsiella pneumoniae NOT DETECTED NOT DETECTED Final   Proteus species NOT DETECTED NOT DETECTED Final   Salmonella species NOT DETECTED NOT DETECTED Final   Serratia marcescens NOT DETECTED NOT DETECTED Final    Haemophilus influenzae NOT DETECTED NOT DETECTED Final   Neisseria meningitidis NOT DETECTED NOT DETECTED Final   Pseudomonas aeruginosa DETECTED (A) NOT DETECTED Final    Comment: CRITICAL RESULT CALLED TO, READ BACK BY AND VERIFIED WITH: PHARMD T DANG [id number] AT 1420 BY EC    Stenotrophomonas maltophilia NOT DETECTED NOT DETECTED Final   Candida albicans NOT DETECTED NOT DETECTED Final   Candida auris NOT DETECTED NOT DETECTED Final   Candida glabrata NOT DETECTED NOT DETECTED Final   Candida krusei NOT DETECTED NOT DETECTED Final   Candida parapsilosis NOT DETECTED NOT DETECTED Final   Candida tropicalis NOT DETECTED NOT DETECTED Final   Cryptococcus neoformans/gattii NOT DETECTED NOT DETECTED Final   CTX-M ESBL NOT DETECTED NOT DETECTED Final   Carbapenem resistance IMP NOT DETECTED NOT DETECTED Final   Carbapenem resistance KPC NOT DETECTED NOT DETECTED Final   Carbapenem resistance NDM NOT DETECTED NOT DETECTED Final   Carbapenem resistance VIM NOT DETECTED NOT DETECTED Final    Comment: Performed at [facility], [address], [place], [place] [address]  MRSA Next Gen by PCR, Nasal     Status: None   Collection Time: 05/06/23 12:41 AM   Specimen: Nasal Mucosa; Nasal Swab  Result Value Ref Range Status   MRSA by PCR Next Gen NOT DETECTED NOT DETECTED Final    Comment: (NOTE) The GeneXpert MRSA Assay (FDA approved for NASAL specimens only), is one component of a comprehensive MRSA colonization surveillance program. It is not intended to diagnose MRSA infection nor to guide or monitor treatment for MRSA infections. Test performance is not FDA approved in patients less than 34 years old. Performed at [facility], [address], [place], [place] [address]   Blood culture (routine x 2)     Status: None (Preliminary result)  Collection Time: 05/06/23  1:55 AM   Specimen: BLOOD LEFT HAND  Result Value Ref Range Status   Specimen Description BLOOD LEFT HAND   Final   Special Requests   Final    BOTTLES DRAWN AEROBIC AND ANAEROBIC Blood Culture adequate volume   Culture   Final    NO GROWTH 3 DAYS Performed at [facility], [address] [place], [place] [address]    Report Status PENDING  Incomplete  Culture, blood (Routine X 2) w Reflex to ID Panel     Status: None (Preliminary result)   Collection Time: 05/08/23  8:21 AM   Specimen: BLOOD LEFT FOREARM  Result Value Ref Range Status   Specimen Description BLOOD LEFT FOREARM  Final   Special Requests   Final    BOTTLES DRAWN AEROBIC AND ANAEROBIC Blood Culture adequate volume   Culture   Final    NO GROWTH < 24 HOURS Performed at [facility], [address] [place], [place] [address]    Report Status PENDING  Incomplete  Culture, blood (Routine X 2) w Reflex to ID Panel     Status: None (Preliminary result)   Collection Time: 05/08/23  8:21 AM   Specimen: BLOOD RIGHT HAND  Result Value Ref Range Status   Specimen Description BLOOD RIGHT HAND  Final   Special Requests   Final    BOTTLES DRAWN AEROBIC AND ANAEROBIC Blood Culture adequate volume   Culture   Final    NO GROWTH < 24 HOURS Performed at [facility], [address] [place], [place] [address]    Report Status PENDING  Incomplete     Radiology Studies: No results found.  Scheduled Meds:  sodium chloride   Intravenous Once   calcium carbonate  1 tablet Oral Q breakfast   ciprofloxacin  750 mg Oral BID   cyanocobalamin  1,000 mcg Oral Daily   dapsone  100 mg Oral Daily   escitalopram  10 mg Oral Daily   feeding supplement  237 mL Oral BID BM   ferrous sulfate  325 mg Oral BID   folic acid  1 mg Oral Daily   levothyroxine  125 mcg Oral Daily   melatonin  3 mg Oral QHS   methylphenidate  2.5 mg Oral Daily   pantoprazole  40 mg Oral Daily   valACYclovir  500 mg Oral Daily   Continuous Infusions:   LOS: 4 days   Hughie Closs, MD [facility]  05/09/2023, 1:06 PM   *Please note  that this is a verbal dictation therefore any spelling or grammatical errors are due to the "Dragon [employer] One" system interpretation.  Please page via Amion and do not message via secure chat for urgent patient care matters. Secure chat can be used for non urgent patient care matters.  How to contact the [facility] Attending or Consulting provider 7A - 7P or covering provider during after hours 7P -7A, for this patient?  Check the care team in [facility] and look for a) attending/consulting TRH provider listed and b) the [place] team listed. Page or secure chat 7A-7P. Log into [email or web address] and use [facility] universal password to access. If you do not have the password, please contact the hospital operator. Locate the [facility] provider you are looking for under [facility] and page to a number that you can be directly reached. If you still have difficulty reaching the provider, please page the [facility] (Director on Call) for the  Hospitalists listed on amion for assistance.

## 2023-05-09 NOTE — Progress Notes (Signed)
Patient is alert and oriented x2. He complained that he could not sleep after receiving his melatonin. Gave him his prn trazodone last night and he slept well. This morning, was arousable to voice but is drowsy. Swallowed morning meds without issues and denied pain.

## 2023-05-09 NOTE — TOC Progression Note (Addendum)
Transition of Care [facility]) - Progression Note    Patient Details  Name: Andre [name] MRN: [id number] Date of Birth: 1950/09/25  Transition of [facility]) CM/SW Contact  Delilah Shan, LCSWA Phone Number: 05/09/2023, 2:02 PM  Clinical Narrative:     CSW spoke with patient spouse regarding dc plan. Patients spouse informed CSW that she spoke with Lupita Leash at [facility] and has decided for patient to go to [facility] when medically ready for dc. CSW spoke with Lupita Leash at [facility] who confirmed patient can dc over when medically ready for dc.CSW will continue to follow.  Expected Discharge Plan:  (TBD) Barriers to Discharge: Continued Medical Work up  Expected Discharge Plan and Services In-house Referral: Clinical Social Work     Living arrangements for the past 2 months: Assisted Living Facility Interior and spatial designer)                                       Social Determinants of Health (SDOH) Interventions SDOH Screenings   Food Insecurity: Low Risk  (10/13/2022)   Received from [facility], [facility]  Transportation Needs: No Transportation Needs (11/17/2022)  Utilities: Low Risk  (10/13/2022)   [facility]  Depression (PHQ2-9): Low Risk  (06/08/2022)  Tobacco Use: Medium Risk (05/02/2023)    Readmission Risk Interventions     No data to display

## 2023-05-09 NOTE — Progress Notes (Signed)
Physical Therapy Treatment Patient Details Name: Andre [name] MRN: [id number] DOB: Aug 16, 1951 Today's Date: 05/09/2023   History of Present Illness Pt is a 71yo male who present to ED after being found down in his bed with gurgling respirations, + emesis, and  suctioned for large amount of gastric contents. Pt was hypoxemic. Found to have apiration pneumonia PMH: mulitple myeloma, stem cell transplant, CAR-T therapy in January-slow response to treatment, thyroid disease, HTN, GERD, duodenal ulcer, depression    PT Comments  Pt making steady progress. Gait stability improving and was able to maintain adequate SpO2 on room air throughout. Patient will benefit from continued inpatient follow up therapy, <3 hours/day     If plan is discharge home, recommend the following: A little help with walking and/or transfers;A little help with bathing/dressing/bathroom;Assistance with cooking/housework;Direct supervision/assist for medications management;Direct supervision/assist for financial management;Assist for transportation   Can travel by private vehicle     Yes  Equipment Recommendations  None recommended by PT    Recommendations for Other Services       Precautions / Restrictions Precautions Precautions: Fall Precaution Comments: watch SpO2 Restrictions Weight Bearing Restrictions: No     Mobility  Bed Mobility Overal bed mobility: Needs Assistance Bed Mobility: Supine to Sit     Supine to sit: Supervision, HOB elevated     General bed mobility comments: Incr time    Transfers Overall transfer level: Needs assistance Equipment used: Rolling walker (2 wheels) Transfers: Sit to/from Stand Sit to Stand: Contact guard assist           General transfer comment: Assist for safety. Verbal cues to bring feet back further prior to stand. Uses momentum to rise    Ambulation/Gait Ambulation/Gait assistance: Contact guard assist Gait Distance (Feet): 450 Feet Assistive  device: Rolling walker (2 wheels) Gait Pattern/deviations: Step-through pattern, Decreased stride length, Trunk flexed Gait velocity: decr Gait velocity interpretation: 1.31 - 2.62 ft/sec, indicative of limited community ambulator   General Gait Details: Intermittent verbal cues to stand more erect. When cued to stand more erect pt self correcting distance from walker and stepping closer to walker.  Slight instability of left knee x 1 with pt able to correct.   Stairs             Wheelchair Mobility     Tilt Bed    Modified Rankin (Stroke Patients Only)       Balance Overall balance assessment: Needs assistance Sitting-balance support: Feet supported, Bilateral upper extremity supported Sitting balance-Leahy Scale: Fair     Standing balance support: Bilateral upper extremity supported, During functional activity, Reliant on assistive device for balance, No upper extremity supported Standing balance-Leahy Scale: Poor Standing balance comment: With RW stands with supervision. Without assistive device min guard for static standing                            Cognition Arousal: Alert Behavior During Therapy: Flat affect Overall Cognitive Status: Within Functional Limits for tasks assessed                                 General Comments: per family some baseline short term memory deficits        Exercises Other Exercises Other Exercises: repeat sit to stand x 5 Other Exercises: Standing resisted knee extension focusing on control x 10 bilaterally    General Comments General comments (  skin integrity, edema, etc.): SpO2 > 95% on RA.      Pertinent Vitals/Pain Pain Assessment Pain Assessment: No/denies pain    Home Living                          Prior Function            PT Goals (current goals can now be found in the care plan section) Progress towards PT goals: Progressing toward goals    Frequency    Min  1X/week      PT Plan      Co-evaluation              AM-PAC PT "6 Clicks" Mobility   Outcome Measure  Help needed turning from your back to your side while in a flat bed without using bedrails?: A Little Help needed moving from lying on your back to sitting on the side of a flat bed without using bedrails?: A Little Help needed moving to and from a bed to a chair (including a wheelchair)?: A Little Help needed standing up from a chair using your arms (e.g., wheelchair or bedside chair)?: A Little Help needed to walk in hospital room?: A Little Help needed climbing 3-5 steps with a railing? : A Lot 6 Click Score: 17    End of Session Equipment Utilized During Treatment: Gait belt Activity Tolerance: Patient tolerated treatment well Patient left: in chair;with call bell/phone within reach;with chair alarm set;with family/visitor present Nurse Communication: Mobility status PT Visit Diagnosis: Unsteadiness on feet (R26.81);Muscle weakness (generalized) (M62.81)     Time: 6301-6010 PT Time Calculation (min) (ACUTE ONLY): 37 min  Charges:    $Gait Training: 8-22 mins $Therapeutic Exercise: 8-22 mins PT General Charges $$ ACUTE PT VISIT: 1 Visit                     [facility] PT Acute Rehabilitation Services Office [phone]    Angelina Ok [facility] 05/09/2023, 11:53 AM

## 2023-05-09 NOTE — Progress Notes (Signed)
Modified Barium Swallow Study  Patient Details  Name: Andre [name] MRN: [id number] Date of Birth: Oct 27, 1950  Today's Date: 05/09/2023  Modified Barium Swallow completed.  Full report located under Chart Review in the Imaging Section.  History of Present Illness Patient is a 72 y.o. male with PMH: GERD, HTN, HLD, thyroid disease, multiple myeloma, large hiatal hernia, dysphagia. He had an MBS at a different hospital with SLP reporting penetration with thin liquids via straw sips but not with cup sips. He presented to the hospital on 05/05/23 after being found down in his bed with gurgling respirations, emesis and was suctioned for large amount of gastric contents; he was hypoxemic and found to have aspiration PNA.   Clinical Impression Pt presents with a mild oropharyngeal dysphagia characterized primarily by impaired timing for which he compensates for independently. Pt has trace vallecular residue with thicker consistencies, which he senses and independently clears his thoat intermittently to clear from his pharynx. With larger boluses, the head of the bolus reaches the laryngeal vestibule prior to epiglottic inversion and laryngeal elevation being initiated and results in trace, transient penetration (PAS 2) of thin liquids and nectar thick liquids via cup and straw. This is considered a normal age variant of swallowing and appears consistent with pt's previous MBS (10/20/22). This appeared to resolve with small, single cup sips. No further penetration or aspiration noted with honey thick liquids, purees, or solids. Pt was given the pill with thin liquids, which was noted to be retained in the esophagus during the fluoro sweep. SLP provided subsequent bites of purees x2, which cleared the pill from the esophagus completely. Recommend pt continue diet of Dys 3 textures with thin liquids via small, controlled cup sips. No further SLP f/u is necessary at this time. Factors that may increase risk of  adverse event in presence of aspiration [name] & Clearance Coots 2021): Reduced cognitive function  Swallow Evaluation Recommendations Recommendations: PO diet PO Diet Recommendation: Dysphagia 3 (Mechanical soft);Thin liquids (Level 0) Liquid Administration via: Cup;No straw Medication Administration: Whole meds with puree Supervision: Staff to assist with self-feeding;Full supervision/cueing for swallowing strategies Swallowing strategies  : Minimize environmental distractions;Slow rate;Small bites/sips Postural changes: Position pt fully upright for meals Oral care recommendations: Oral care BID (2x/day)    Gwynneth Aliment, M.A., CF-SLP Speech Language Pathology, Acute Rehabilitation Services  Secure Chat preferred [phone]  05/09/2023,10:36 AM

## 2023-05-09 NOTE — Consult Note (Signed)
[facility]    Date of Admission:  05/05/2023     Total days of antibiotics 5               Reason for Consult: Pseudomonas Bacteremia   Referring Provider: Dr. Arnetha Courser  Primary Care Provider: [name], MD   ASSESSMENT:  Andre [name] is a 72 y/o male admitted after being found down with vomiting and found to have fever and blood cultures positive for Pseudomonas bacteremia. Imaging with no clear evidence of aspiration pneumonia and bacteremia appears to have cleared quickly. No clear source of infection upon examination. Currently on Day 5 of antibiotic therapy and will treat for simple bacteremia and change cefepime to ciprofloxacin for an additional 4 days with end of treatment on 8/25. No additional ID follow up needed. Remaining medical and supportive care per Internal Medicine.   PLAN:  Change antibiotics to Cirpofloxacin 750 mg PO bid for additional 4 days through 8/25.  Remaining medical and supportive care per Internal Medicine.  ID will sign off. Please re-consult if needed.   I have personally spent 25 minutes involved in face-to-face and non-face-to-face activities for this patient on the day of the visit. Professional time spent includes the following activities: Preparing to see the patient (review of tests), Obtaining and/or reviewing separately obtained history (admission/discharge record), Performing a medically appropriate examination and/or evaluation , Ordering medications/tests/procedures, referring and communicating with other health care professionals, Documenting clinical information in the EMR, Independently interpreting results (not separately reported), Communicating results to the patient/family/caregiver, Counseling and educating the patient/family/caregiver and Care coordination (not separately reported).     Principal Problem:   Acute hypoxemic respiratory failure (HCC) Active Problems:   Pancytopenia (HCC)    Hypothyroidism   Multiple myeloma not having achieved remission (HCC)   Sepsis (HCC)   GERD (gastroesophageal reflux disease)   Physical deconditioning    sodium chloride   Intravenous Once   calcium carbonate  1 tablet Oral Q breakfast   ciprofloxacin  750 mg Oral BID   cyanocobalamin  1,000 mcg Oral Daily   dapsone  100 mg Oral Daily   escitalopram  10 mg Oral Daily   feeding supplement  237 mL Oral BID BM   ferrous sulfate  325 mg Oral BID   folic acid  1 mg Oral Daily   levothyroxine  125 mcg Oral Daily   melatonin  3 mg Oral QHS   methylphenidate  2.5 mg Oral Daily   pantoprazole  40 mg Oral Daily   valACYclovir  500 mg Oral Daily     HPI: Andre [name] is a 72 y.o. male with previous medical history as detailed below arriving to the hospital from skilled facility after being found lying on the bathroom floor and had been vomiting.   Andre [name] was fine earlier in the day prior to arrival but was found to have gurggling respirations and was suctioned by EMS. Febrile on arrival with temperature of 102 F. Chest x-ray with no consolidative airspace disease and CT chest/abdomen/pelvis with no acute abnormality in the chest, abdomen or pelvis. Has history of multiple myeloma and stem cell transplant with latest therapy >1 month ago. Denies implanted port. CBC without neutropenia. Initially received vancomycin and cefepime. And was transitioned to cefepime once blood culture (1/2 bottles) was found to be positive for Pseudomonas aeruginosa. Blood cultures from 05/06/23 and 05/08/23 have been without growth. Afebrile since initial fever. ID has been  asked about duration of treatment for Pseudomonas bacteremia.   Review of Systems: Review of Systems  Constitutional:  Negative for chills, fever and weight loss.  Respiratory:  Negative for cough, shortness of breath and wheezing.   Cardiovascular:  Negative for chest pain and leg swelling.  Gastrointestinal:  Negative for abdominal  pain, constipation, diarrhea, nausea and vomiting.  Skin:  Negative for rash.     Past Medical History:  Diagnosis Date   Allergy    Anemia    Arthritis    Asthma    no treatment x 20 years   Depression    Double vision    occurs at times    Duodenal ulcer    GERD (gastroesophageal reflux disease)    Hyperlipidemia    Hypertension    Hypothyroidism    Multiple myeloma 07/04/2011   Thyroid disease     Social History   Tobacco Use   Smoking status: Former    Current packs/day: 0.00    Types: Cigarettes    Start date: 03/27/1966    Quit date: 03/27/1969    Years since quitting: 54.1   Smokeless tobacco: Never  Substance Use Topics   Alcohol use: No   Drug use: No    Family History  Problem Relation Age of Onset   Throat cancer Mother    Hypertension Father    Stroke Father    Asthma Father    Diabetes Father    Pancreatic cancer Brother     Allergies  Allergen Reactions   Atorvastatin Other (See Comments)   Rosuvastatin     Other reaction(s): Liver Disorder   Crestor [Rosuvastatin Calcium]     ADVERSE EFFECTS ON LIVER   Lipitor [Atorvastatin Calcium]    Septra [Sulfamethoxazole-Trimethoprim] Rash    OBJECTIVE: Blood pressure (!) 103/54, pulse 76, temperature 97.7 F (36.5 C), temperature source Oral, resp. rate 18, weight 81.2 kg, SpO2 94%.  Physical Exam Constitutional:      General: He is not in acute distress.    Appearance: He is well-developed.  Cardiovascular:     Rate and Rhythm: Normal rate and regular rhythm.     Heart sounds: Normal heart sounds.  Pulmonary:     Effort: Pulmonary effort is normal.     Breath sounds: Normal breath sounds.  Skin:    General: Skin is warm and dry.  Neurological:     Mental Status: He is alert.  Psychiatric:        Mood and Affect: Mood normal.     Lab Results Lab Results  Component Value Date   WBC 2.3 (L) 05/09/2023   HGB 8.9 (L) 05/09/2023   HCT 27.0 (L) 05/09/2023   MCV 110.2 (H) 05/09/2023    PLT 30 (L) 05/09/2023    Lab Results  Component Value Date   CREATININE 1.04 05/08/2023   BUN 16 05/08/2023   NA 135 05/08/2023   K 3.6 05/08/2023   CL 105 05/08/2023   CO2 24 05/08/2023    Lab Results  Component Value Date   ALT 29 05/05/2023   AST 35 05/05/2023   ALKPHOS 87 05/05/2023   BILITOT 0.5 05/05/2023     Microbiology: Recent Results (from the past 240 hour(s))  Blood culture (routine x 2)     Status: Abnormal   Collection Time: 05/05/23  7:01 PM   Specimen: BLOOD RIGHT HAND  Result Value Ref Range Status   Specimen Description BLOOD RIGHT HAND  Final   Special Requests  Final    BOTTLES DRAWN AEROBIC AND ANAEROBIC Blood Culture results may not be optimal due to an inadequate volume of blood received in culture bottles   Culture  Setup Time   Final    GRAM NEGATIVE RODS AEROBIC BOTTLE ONLY CRITICAL RESULT CALLED TO, READ BACK BY AND VERIFIED WITH: PHARMD T DANG [id number] AT 1420 BY EC Performed at [facility], [address], [place], [place] [address]    Culture PSEUDOMONAS AERUGINOSA (A)  Final   Report Status 05/08/2023 FINAL  Final   Organism ID, Bacteria PSEUDOMONAS AERUGINOSA  Final      Susceptibility   Pseudomonas aeruginosa - MIC*    CEFTAZIDIME 4 SENSITIVE Sensitive     CIPROFLOXACIN <=0.25 SENSITIVE Sensitive     GENTAMICIN 2 SENSITIVE Sensitive     IMIPENEM 1 SENSITIVE Sensitive     PIP/TAZO 8 SENSITIVE Sensitive     CEFEPIME 2 SENSITIVE Sensitive     * PSEUDOMONAS AERUGINOSA  Blood Culture ID Panel (Reflexed)     Status: Abnormal   Collection Time: 05/05/23  7:01 PM  Result Value Ref Range Status   Enterococcus faecalis NOT DETECTED NOT DETECTED Final   Enterococcus Faecium NOT DETECTED NOT DETECTED Final   Listeria monocytogenes NOT DETECTED NOT DETECTED Final   Staphylococcus species NOT DETECTED NOT DETECTED Final   Staphylococcus aureus (BCID) NOT DETECTED NOT DETECTED Final   Staphylococcus epidermidis NOT DETECTED NOT  DETECTED Final   Staphylococcus lugdunensis NOT DETECTED NOT DETECTED Final   Streptococcus species NOT DETECTED NOT DETECTED Final   Streptococcus agalactiae NOT DETECTED NOT DETECTED Final   Streptococcus pneumoniae NOT DETECTED NOT DETECTED Final   Streptococcus pyogenes NOT DETECTED NOT DETECTED Final   A.calcoaceticus-baumannii NOT DETECTED NOT DETECTED Final   Bacteroides fragilis NOT DETECTED NOT DETECTED Final   Enterobacterales NOT DETECTED NOT DETECTED Final   Enterobacter cloacae complex NOT DETECTED NOT DETECTED Final   Escherichia coli NOT DETECTED NOT DETECTED Final   Klebsiella aerogenes NOT DETECTED NOT DETECTED Final   Klebsiella oxytoca NOT DETECTED NOT DETECTED Final   Klebsiella pneumoniae NOT DETECTED NOT DETECTED Final   Proteus species NOT DETECTED NOT DETECTED Final   Salmonella species NOT DETECTED NOT DETECTED Final   Serratia marcescens NOT DETECTED NOT DETECTED Final   Haemophilus influenzae NOT DETECTED NOT DETECTED Final   Neisseria meningitidis NOT DETECTED NOT DETECTED Final   Pseudomonas aeruginosa DETECTED (A) NOT DETECTED Final    Comment: CRITICAL RESULT CALLED TO, READ BACK BY AND VERIFIED WITH: PHARMD T DANG [id number] AT 1420 BY EC    Stenotrophomonas maltophilia NOT DETECTED NOT DETECTED Final   Candida albicans NOT DETECTED NOT DETECTED Final   Candida auris NOT DETECTED NOT DETECTED Final   Candida glabrata NOT DETECTED NOT DETECTED Final   Candida krusei NOT DETECTED NOT DETECTED Final   Candida parapsilosis NOT DETECTED NOT DETECTED Final   Candida tropicalis NOT DETECTED NOT DETECTED Final   Cryptococcus neoformans/gattii NOT DETECTED NOT DETECTED Final   CTX-M ESBL NOT DETECTED NOT DETECTED Final   Carbapenem resistance IMP NOT DETECTED NOT DETECTED Final   Carbapenem resistance KPC NOT DETECTED NOT DETECTED Final   Carbapenem resistance NDM NOT DETECTED NOT DETECTED Final   Carbapenem resistance VIM NOT DETECTED NOT DETECTED Final     Comment: Performed at [facility], [address], [place], [place] [address]  MRSA Next Gen by PCR, Nasal     Status: None   Collection Time: 05/06/23  12:41 AM   Specimen: Nasal Mucosa; Nasal Swab  Result Value Ref Range Status   MRSA by PCR Next Gen NOT DETECTED NOT DETECTED Final    Comment: (NOTE) The GeneXpert MRSA Assay (FDA approved for NASAL specimens only), is one component of a comprehensive MRSA colonization surveillance program. It is not intended to diagnose MRSA infection nor to guide or monitor treatment for MRSA infections. Test performance is not FDA approved in patients less than 23 years old. Performed at [facility], [address], [place], [place] [address]   Blood culture (routine x 2)     Status: None (Preliminary result)   Collection Time: 05/06/23  1:55 AM   Specimen: BLOOD LEFT HAND  Result Value Ref Range Status   Specimen Description BLOOD LEFT HAND  Final   Special Requests   Final    BOTTLES DRAWN AEROBIC AND ANAEROBIC Blood Culture adequate volume   Culture   Final    NO GROWTH 3 DAYS Performed at [facility], [address], [place], [place] [address]    Report Status PENDING  Incomplete  Culture, blood (Routine X 2) w Reflex to ID Panel     Status: None (Preliminary result)   Collection Time: 05/08/23  8:21 AM   Specimen: BLOOD LEFT FOREARM  Result Value Ref Range Status   Specimen Description BLOOD LEFT FOREARM  Final   Special Requests   Final    BOTTLES DRAWN AEROBIC AND ANAEROBIC Blood Culture adequate volume   Culture   Final    NO GROWTH < 24 HOURS Performed at [facility], [address], [place], [place] [address]    Report Status PENDING  Incomplete  Culture, blood (Routine X 2) w Reflex to ID Panel     Status: None (Preliminary result)   Collection Time: 05/08/23  8:21 AM   Specimen: BLOOD RIGHT HAND  Result Value Ref Range Status   Specimen Description BLOOD RIGHT HAND  Final   Special Requests    Final    BOTTLES DRAWN AEROBIC AND ANAEROBIC Blood Culture adequate volume   Culture   Final    NO GROWTH < 24 HOURS Performed at [facility], [address] [place], [place] [address]    Report Status PENDING  Incomplete     Marcos Eke, NP [facility]  05/09/2023  3:30 PM

## 2023-05-09 NOTE — Plan of Care (Signed)

## 2023-05-10 ENCOUNTER — Telehealth: Payer: Self-pay | Admitting: *Deleted

## 2023-05-10 ENCOUNTER — Inpatient Hospital Stay (HOSPITAL_COMMUNITY): Payer: Medicare Other

## 2023-05-10 DIAGNOSIS — J9601 Acute respiratory failure with hypoxia: Secondary | ICD-10-CM | POA: Diagnosis not present

## 2023-05-10 LAB — CBC WITH DIFFERENTIAL/PLATELET
Abs Immature Granulocytes: 0.03 10*3/uL (ref 0.00–0.07)
Abs Immature Granulocytes: 0.03 10*3/uL (ref 0.00–0.07)
Basophils Absolute: 0 10*3/uL (ref 0.0–0.1)
Basophils Absolute: 0 10*3/uL (ref 0.0–0.1)
Basophils Relative: 0 %
Basophils Relative: 0 %
Eosinophils Absolute: 0.1 10*3/uL (ref 0.0–0.5)
Eosinophils Absolute: 0.1 10*3/uL (ref 0.0–0.5)
Eosinophils Relative: 3 %
Eosinophils Relative: 2 %
HCT: 27.7 % — ABNORMAL LOW (ref 39.0–52.0)
HCT: 30.9 % — ABNORMAL LOW (ref 39.0–52.0)
Hemoglobin: 9.2 g/dL — ABNORMAL LOW (ref 13.0–17.0)
Hemoglobin: 10.1 g/dL — ABNORMAL LOW (ref 13.0–17.0)
Immature Granulocytes: 1 %
Immature Granulocytes: 1 %
Lymphocytes Relative: 8 %
Lymphocytes Relative: 11 %
Lymphs Abs: 0.2 10*3/uL — ABNORMAL LOW (ref 0.7–4.0)
Lymphs Abs: 0.3 10*3/uL — ABNORMAL LOW (ref 0.7–4.0)
MCH: 36.9 pg — ABNORMAL HIGH (ref 26.0–34.0)
MCH: 35.8 pg — ABNORMAL HIGH (ref 26.0–34.0)
MCHC: 33.2 g/dL (ref 30.0–36.0)
MCHC: 32.7 g/dL (ref 30.0–36.0)
MCV: 111.2 fL — ABNORMAL HIGH (ref 80.0–100.0)
MCV: 109.6 fL — ABNORMAL HIGH (ref 80.0–100.0)
Monocytes Absolute: 0.4 10*3/uL (ref 0.1–1.0)
Monocytes Absolute: 0.5 10*3/uL (ref 0.1–1.0)
Monocytes Relative: 15 %
Monocytes Relative: 17 %
Neutro Abs: 1.7 10*3/uL (ref 1.7–7.7)
Neutro Abs: 1.8 10*3/uL (ref 1.7–7.7)
Neutrophils Relative %: 73 %
Neutrophils Relative %: 69 %
Platelets: 27 10*3/uL — CL (ref 150–400)
Platelets: 30 10*3/uL — ABNORMAL LOW (ref 150–400)
RBC: 2.49 MIL/uL — ABNORMAL LOW (ref 4.22–5.81)
RBC: 2.82 MIL/uL — ABNORMAL LOW (ref 4.22–5.81)
RDW: 23 % — ABNORMAL HIGH (ref 11.5–15.5)
RDW: 22.4 % — ABNORMAL HIGH (ref 11.5–15.5)
WBC: 2.4 10*3/uL — ABNORMAL LOW (ref 4.0–10.5)
WBC: 2.6 10*3/uL — ABNORMAL LOW (ref 4.0–10.5)
nRBC: 0 % (ref 0.0–0.2)
nRBC: 0 % (ref 0.0–0.2)
nRBC: 0 /100 WBC

## 2023-05-10 LAB — BASIC METABOLIC PANEL
Anion gap: 8 (ref 5–15)
BUN: 19 mg/dL (ref 8–23)
CO2: 26 mmol/L (ref 22–32)
Calcium: 9.1 mg/dL (ref 8.9–10.3)
Chloride: 102 mmol/L (ref 98–111)
Creatinine, Ser: 0.91 mg/dL (ref 0.61–1.24)
GFR, Estimated: >60 mL/min (ref >60)
Glucose, Bld: 95 mg/dL (ref 70–99)
Potassium: 4.2 mmol/L (ref 3.5–5.1)
Sodium: 136 mmol/L (ref 135–145)

## 2023-05-10 LAB — PROTIME-INR
INR: 0.9 (ref 0.8–1.2)
Prothrombin Time: 12.2 seconds (ref 11.4–15.2)

## 2023-05-10 MED ORDER — LIDOCAINE HCL (PF) 1 % IJ SOLN
10.0000 mL | Freq: Once | INTRAMUSCULAR | Status: AC
  Administered 2023-05-10: 10 mL

## 2023-05-10 MED ORDER — MIDAZOLAM HCL 2 MG/2ML IJ SOLN
INTRAMUSCULAR | Status: AC | PRN
Start: 2023-05-10 — End: 2023-05-10
  Administered 2023-05-10: 1 mg via INTRAVENOUS

## 2023-05-10 MED ORDER — FENTANYL CITRATE (PF) 100 MCG/2ML IJ SOLN
INTRAMUSCULAR | Status: AC | PRN
  Administered 2023-05-10: 50 ug via INTRAVENOUS
  Administered 2023-05-10: 25 ug via INTRAVENOUS

## 2023-05-10 MED ORDER — OXYCODONE HCL 5 MG PO TABS
5.0000 mg | ORAL_TABLET | Freq: Four times a day (QID) | ORAL | Status: DC | PRN
  Administered 2023-05-10: 5 mg via ORAL
  Filled 2023-05-10: qty 1

## 2023-05-10 MED ORDER — FENTANYL CITRATE (PF) 100 MCG/2ML IJ SOLN
INTRAMUSCULAR | Status: AC
  Filled 2023-05-10: qty 2

## 2023-05-10 MED ORDER — ACETAMINOPHEN 325 MG PO TABS
650.0000 mg | ORAL_TABLET | Freq: Four times a day (QID) | ORAL | Status: DC | PRN
  Administered 2023-05-10: 650 mg via ORAL
  Filled 2023-05-10: qty 2

## 2023-05-10 MED ORDER — MIDAZOLAM HCL 2 MG/2ML IJ SOLN
INTRAMUSCULAR | Status: AC
  Filled 2023-05-10: qty 2

## 2023-05-10 NOTE — TOC Progression Note (Signed)
Transition of Care [facility]) - Progression Note    Patient Details  Name: Andre [name] MRN: [id number] Date of Birth: 1951-05-19  Transition of Care [facility] CM/SW Contact  Delilah Shan, LCSWA Phone Number: 05/10/2023, 12:55 PM  Clinical Narrative:     Plan for patient to DC to [facility] when medically ready for dc. CSW will continue to follow and assist with patients dc planning needs.  Expected Discharge Plan:  (TBD) Barriers to Discharge: Continued Medical Work up  Expected Discharge Plan and Services In-house Referral: Clinical Social Work     Living arrangements for the past 2 months: Assisted Living Facility Interior and spatial designer)                                       Social Determinants of Health (SDOH) Interventions SDOH Screenings   Food Insecurity: Low Risk  (10/13/2022)   Received from [facility]  Transportation Needs: No Transportation Needs (11/17/2022)  Utilities: Low Risk  (10/13/2022)   [facility]  Depression (PHQ2-9): Low Risk  (06/08/2022)  Tobacco Use: Medium Risk (05/02/2023)    Readmission Risk Interventions     No data to display

## 2023-05-10 NOTE — Progress Notes (Signed)
Mobility Specialist Progress Note:    05/10/23 1148  Mobility  Activity Ambulated with assistance in hallway  Level of Assistance Contact guard assist, steadying assist  Assistive Device Front wheel walker  Distance Ambulated (ft) 450 ft  Activity Response Tolerated well  Mobility Referral Yes  $Mobility charge 1 Mobility  Mobility Specialist Start Time (ACUTE ONLY) 1120  Mobility Specialist Stop Time (ACUTE ONLY) 1134  Mobility Specialist Time Calculation (min) (ACUTE ONLY) 14 min   Received pt in chair having no complaints and agreeable to mobility. Pt was asymptomatic throughout ambulation and returned to room w/o fault. Left in chaiar w/ call bell in reach and all needs met.   Thompson Grayer Mobility Specialist  Please contact vis Secure Chat or  Rehab Office [phone]

## 2023-05-10 NOTE — Consult Note (Signed)
Chief Complaint: Patient was seen in consultation today for Bone Marrow biopsy Chief Complaint  Patient presents with   Respiratory Distress   at the request of Dr Truett Perna  Supervising Physician: Gilmer Mor  Patient Status: [facility] - In-pt  History of Present Illness: Andre [name] is a 72 y.o. male  Pt rescinds DNR status for IR procedure today Hx Multiple Myeloma Post CAR-T therapy Persistent pancytopenia Dr Truett Perna request Bone marrow biopsy today    Past Medical History:  Diagnosis Date   Allergy    Anemia    Arthritis    Asthma    no treatment x 20 years   Depression    Double vision    occurs at times    Duodenal ulcer    GERD (gastroesophageal reflux disease)    Hyperlipidemia    Hypertension    Hypothyroidism    Multiple myeloma 07/04/2011   Thyroid disease     Past Surgical History:  Procedure Laterality Date   BONE MARROW TRANSPLANT  2011   for MM   CARDIOLITE STUDY  11/25/2003   NORMAL   TONSILLECTOMY      Allergies: Atorvastatin, Rosuvastatin, Crestor [rosuvastatin calcium], Lipitor [atorvastatin calcium], and Septra [sulfamethoxazole-trimethoprim]  Medications: Prior to Admission medications   Medication Sig Start Date End Date Taking? Authorizing Provider  acetaminophen (TYLENOL) 650 MG CR tablet Take 650 mg by mouth every 6 (six) hours as needed for pain.   Yes [provider]  calcium carbonate (TUMS EX) 750 MG chewable tablet Chew 2 tablets by mouth 3 (three) times daily as needed for heartburn.   Yes [provider]  calcium-vitamin D (OSCAL WITH D) 500-200 MG-UNIT tablet Take 1 tablet by mouth daily. 08/14/22  Yes [name], Georgina Quint, MD  Cholecalciferol (VITAMIN D3) 25 MCG (1000 UT) capsule Take 1,000 Units by mouth daily.   Yes [provider]  cyanocobalamin (VITAMIN B12) 1000 MCG tablet Take 1 tablet (1,000 mcg total) by mouth daily. 04/09/23  Yes Rana Snare, NP  dapsone 100 MG tablet Take 100  mg by mouth daily.   Yes Ladene Artist, MD  docusate sodium (COLACE) 100 MG capsule Take 100 mg by mouth daily as needed for mild constipation or moderate constipation.   Yes [provider]  escitalopram (LEXAPRO) 10 MG tablet Take 1 tablet (10 mg total) by mouth daily. 06/27/22  Yes [name], Georgina Quint, MD  ferrous sulfate 325 (65 FE) MG EC tablet Take 325 mg by mouth 2 (two) times daily.   Yes [provider]  folic acid (FOLVITE) 1 MG tablet Take 1 tablet (1 mg total) by mouth daily. 04/09/23  Yes Rana Snare, NP  levothyroxine (SYNTHROID) 125 MCG tablet Take 125 mcg by mouth daily. 04/12/23  Yes [provider]  methylphenidate (RITALIN) 5 MG tablet Take 0.5 tablets (2.5 mg total) by mouth daily. Each morning 04/05/23  Yes Wert, Trula Ore, NP  pantoprazole (PROTONIX) 40 MG tablet Take 1 tablet (40 mg total) by mouth daily. 04/20/23  Yes Wert, Trula Ore, NP  triamcinolone lotion (KENALOG) 0.1 % Apply 1 Application topically 2 (two) times daily. Apply a quarter sized amount to both legs 2 times daily for dermatitis. 01/12/23  Yes [provider]  valACYclovir (VALTREX) 500 MG tablet Take 500 mg by mouth daily.   Yes [provider]  triamcinolone ointment (KENALOG) 0.1 % Apply to affected area topically 2 (two) times daily. Patient not taking: Reported on 05/06/2023 08/16/22   [name],  Georgina Quint, MD  prochlorperazine (COMPAZINE) 10 MG tablet Take 1 tablet (10 mg total) by mouth every 6 (six) hours as needed (Nausea or vomiting). 12/14/20 12/12/21  Magrinat, Valentino Hue, MD     Family History  Problem Relation Age of Onset   Throat cancer Mother    Hypertension Father    Stroke Father    Asthma Father    Diabetes Father    Pancreatic cancer Brother     Social History   Socioeconomic History   Marital status: Married    Spouse name: Not on file   Number of children: 2   Years of education: 16   Highest education level: Not on file   Occupational History   Not on file  Tobacco Use   Smoking status: Former    Current packs/day: 0.00    Types: Cigarettes    Start date: 03/27/1966    Quit date: 03/27/1969    Years since quitting: 54.1   Smokeless tobacco: Never  Substance and Sexual Activity   Alcohol use: No   Drug use: No   Sexual activity: Not Currently  Other Topics Concern   Not on file  Social History Narrative   Right handed   [employer] fan   Drinks caffeine   [place]   [place] 8 years    Social Determinants of Health   Financial Resource Strain: Not on file  Food Insecurity: Low Risk  (10/13/2022)   [facility]   Food vital sign    Within the past 12 months, you worried that your food would run out before you got money to buy more: Never true    Within the past 12 months, the food you bought just didn't last and you didn't have money to get more. : Never true  Transportation Needs: No Transportation Needs (11/17/2022)   PRAPARE - Administrator, Civil Service (Medical): No    Lack of Transportation (Non-Medical): No  Physical Activity: Not on file  Stress: Not on file  Social Connections: Not on file    Review of Systems: A 12 point ROS discussed and pertinent positives are indicated in the HPI above.  All other systems are negative.  Review of Systems  Constitutional:  Positive for activity change and fatigue.  Respiratory:  Negative for cough and shortness of breath.   Gastrointestinal:  Negative for abdominal pain.  Neurological:  Positive for weakness.  Psychiatric/Behavioral:  Negative for behavioral problems and confusion.     Vital Signs: BP 124/63 (BP Location: Left Arm)   Pulse (!) 55   Temp 97.7 F (36.5 C) (Oral)   Resp 18   Wt 176 lb 1.6 oz (79.9 kg)   SpO2 91%   BMI 27.58 kg/m   Advance Care Plan: The advanced care plan/surrogate decision maker was discussed at the time of visit and documented in the medical record.     Physical Exam Vitals reviewed.  HENT:     Mouth/Throat:     Mouth: Mucous membranes are moist.  Cardiovascular:     Rate and Rhythm: Normal rate and regular rhythm.     Heart sounds: Normal heart sounds.  Pulmonary:     Effort: Pulmonary effort is normal.     Breath sounds: Normal breath sounds.  Abdominal:     Palpations: Abdomen is soft.  Musculoskeletal:        General: Normal range of motion.  Skin:    General: Skin is warm.  Neurological:     Mental Status: He is alert and oriented to person, place, and time.  Psychiatric:        Behavior: Behavior normal.     Imaging: DG Swallowing Func-Speech Pathology  Result Date: 05/09/2023 Table formatting from the original result was not included. Modified Barium Swallow Study Patient Details Name: Andre [name] MRN: [id number] Date of Birth: April 18, 1951 Today's Date: 05/09/2023 HPI/PMH: HPI: Patient is a 72 y.o. male with PMH: GERD, HTN, HLD, thyroid disease, multiple myeloma, large hiatal hernia, dysphagia. He had an MBS at a different hospital with SLP reporting penetration with thin liquids via straw sips but not with cup sips. He presented to the hospital on 05/05/23 after being found down in his bed with gurgling respirations, emesis and was suctioned for large amount of gastric contents; he was hypoxemic and found to have aspiration PNA. Clinical Impression: Clinical Impression: Pt presents with a mild oropharyngeal dysphagia characterized primarily by impaired timing for which he compensates for independently. Pt has trace vallecular residue with thicker consistencies, which he senses and independently clears his thoat intermittently to clear from his pharynx. With larger boluses, the head of the bolus reaches the laryngeal vestibule prior to epiglottic inversion and laryngeal elevation being initiated and results in trace, transient penetration (PAS 2) of thin liquids and nectar thick liquids via cup and straw. This is considered a  normal age variant of swallowing and appears consistent with pt's previous MBS (10/20/22). This appeared to resolve with small, single cup sips. No further penetration or aspiration noted with honey thick liquids, purees, or solids. Pt was given the pill with thin liquids, which was noted to be retained in the esophagus during the flouro sweep. SLP provided subsequent bites of purees x2, which cleared the pill from the esophagus completely. Recommend pt continue diet of Dys 3 textures with thin liquids via small, controlled cup sips. No further SLP f/u is necessary at this time. Factors that may increase risk of adverse event in presence of aspiration [name] & Clearance Coots 2021): Factors that may increase risk of adverse event in presence of aspiration [name] & Clearance Coots 2021): Reduced cognitive function Recommendations/Plan: Swallowing Evaluation Recommendations Swallowing Evaluation Recommendations Recommendations: PO diet PO Diet Recommendation: Dysphagia 3 (Mechanical soft); Thin liquids (Level 0) Liquid Administration via: Cup; No straw Medication Administration: Whole meds with puree Supervision: Staff to assist with self-feeding; Full supervision/cueing for swallowing strategies Swallowing strategies  : Minimize environmental distractions; Slow rate; Small bites/sips Postural changes: Position pt fully upright for meals Oral care recommendations: Oral care BID (2x/day) Treatment Plan Treatment Plan Treatment recommendations: No treatment recommended at this time Follow-up recommendations: No SLP follow up Functional status assessment: Patient has not had a recent decline in their functional status. Recommendations Recommendations for follow up therapy are one component of a multi-disciplinary discharge planning process, led by the attending physician.  Recommendations may be updated based on patient status, additional functional criteria and insurance authorization. Assessment: Orofacial Exam: Orofacial Exam Oral  Cavity: Oral Hygiene: WFL Oral Cavity - Dentition: Adequate natural dentition Orofacial Anatomy: WFL Oral Motor/Sensory Function: WFL Anatomy: Anatomy: Suspected cervical osteophytes Boluses Administered: Boluses Administered Boluses Administered: Thin liquids (Level 0); Mildly thick liquids (Level 2, nectar thick); Moderately thick liquids (Level 3, honey thick); Puree; Solid  Oral Impairment Domain: Oral Impairment Domain Lip Closure: No labial escape Tongue control during bolus hold: Cohesive bolus between tongue to palatal seal Bolus preparation/mastication: Timely and efficient chewing and mashing Bolus transport/lingual motion: Brisk tongue  motion Oral residue: Complete oral clearance Location of oral residue : N/A Initiation of pharyngeal swallow : Valleculae  Pharyngeal Impairment Domain: Pharyngeal Impairment Domain Soft palate elevation: No bolus between soft palate (SP)/pharyngeal wall (PW) Laryngeal elevation: Complete superior movement of thyroid cartilage with complete approximation of arytenoids to epiglottic petiole Anterior hyoid excursion: Complete anterior movement Epiglottic movement: Complete inversion Laryngeal vestibule closure: Incomplete, narrow column air/contrast in laryngeal vestibule Pharyngeal stripping wave : Present - complete Pharyngeal contraction (A/P view only): N/A Pharyngoesophageal segment opening: Complete distension and complete duration, no obstruction of flow Tongue base retraction: No contrast between tongue base and posterior pharyngeal wall (PPW) Pharyngeal residue: Trace residue within or on pharyngeal structures Location of pharyngeal residue: Valleculae  Esophageal Impairment Domain: Esophageal Impairment Domain Esophageal clearance upright position: Esophageal retention Pill: Pill Consistency administered: Thin liquids (Level 0); Puree Thin liquids (Level 0): Impaired (see clinical impressions) Puree: Impaired (see clinical impressions) Penetration/Aspiration Scale  Score: Penetration/Aspiration Scale Score 1.  Material does not enter airway: Moderately thick liquids (Level 3, honey thick); Puree; Solid; Pill 2.  Material enters airway, remains ABOVE vocal cords then ejected out: Thin liquids (Level 0); Mildly thick liquids (Level 2, nectar thick) Compensatory Strategies: Compensatory Strategies Compensatory strategies: Yes Straw: Ineffective Ineffective Straw: Thin liquid (Level 0)   General Information: Caregiver present: No  Diet Prior to this Study: Dysphagia 3 (mechanical soft); Thin liquids (Level 0)   Temperature : Normal   Respiratory Status: WFL   Supplemental O2: Nasal cannula   History of Recent Intubation: No  Behavior/Cognition: Alert; Cooperative; Pleasant mood Self-Feeding Abilities: Needs assist with self-feeding Baseline vocal quality/speech: Normal Volitional Cough: Able to elicit Volitional Swallow: Able to elicit Exam Limitations: No limitations Goal Planning: Prognosis for improved oropharyngeal function: Good Barriers to Reach Goals: Cognitive deficits No data recorded Patient/Family Stated Goal: nothing specific No data recorded Pain: Pain Assessment Pain Assessment: No/denies pain End of Session: Start Time:SLP Start Time (ACUTE ONLY): 0935 Stop Time: SLP Stop Time (ACUTE ONLY): 0958 Time Calculation:SLP Time Calculation (min) (ACUTE ONLY): 23 min Charges: SLP Evaluations $ SLP Speech Visit: 1 Visit SLP Evaluations $BSS Swallow: 1 Procedure $MBS Swallow: 1 Procedure SLP visit diagnosis: SLP Visit Diagnosis: Dysphagia, pharyngeal phase (R13.13) Past Medical History: Past Medical History: Diagnosis Date  Allergy   Anemia   Arthritis   Asthma   no treatment x 20 years  Depression   Double vision   occurs at times   Duodenal ulcer   GERD (gastroesophageal reflux disease)   Hyperlipidemia   Hypertension   Hypothyroidism   Multiple myeloma 07/04/2011  Thyroid disease  Past Surgical History: Past Surgical History: Procedure Laterality Date  BONE MARROW  TRANSPLANT  2011  for MM  CARDIOLITE STUDY  11/25/2003  NORMAL  TONSILLECTOMY   Gwynneth Aliment, M.A., CF-SLP Speech Language Pathology, Acute Rehabilitation Services Secure Chat preferred [phone] 05/09/2023, 1:49 PM  DG Chest Port 1 View  Result Date: 05/06/2023 CLINICAL DATA:  Respiratory failure. EXAM: PORTABLE CHEST 1 VIEW COMPARISON:  05/05/2023 FINDINGS: Stable asymmetric elevation right hemidiaphragm. Left base collapse/consolidation noted. Tiny effusions not excluded. Cardiopericardial silhouette is at upper limits of normal for size. No acute bony abnormality. Telemetry leads overlie the chest. IMPRESSION: Bibasilar collapse/consolidation, left greater than right. Asymmetric elevation right hemidiaphragm. Electronically Signed   By: Kennith Center M.D.   On: 05/06/2023 06:29   CT CHEST ABDOMEN PELVIS WO CONTRAST  Result Date: 05/05/2023 CLINICAL DATA:  Sepsis.  Multiple myeloma.  * Tracking Code: BO *  EXAM: CT CHEST, ABDOMEN AND PELVIS WITHOUT CONTRAST TECHNIQUE: Multidetector CT imaging of the chest, abdomen and pelvis was performed following the standard protocol without IV contrast. RADIATION DOSE REDUCTION: This exam was performed according to the departmental dose-optimization program which includes automated exposure control, adjustment of the mA and/or kV according to patient size and/or use of iterative reconstruction technique. COMPARISON:  Chest radiograph from earlier today. 12/23/2015 chest CT. 11/07/2015 CT abdomen/pelvis. FINDINGS: CT CHEST FINDINGS Substantially motion degraded scan, limiting assessment. Cardiovascular: Normal heart size. No significant pericardial effusion/thickening. Three-vessel coronary atherosclerosis. Atherosclerotic nonaneurysmal thoracic aorta. Normal caliber pulmonary arteries. Mediastinum/Nodes: No significant thyroid nodules. Mildly patulous fluid-filled lower thoracic esophagus. No pathologically enlarged axillary, mediastinal or hilar lymph nodes, noting  limited sensitivity for the detection of hilar adenopathy on this noncontrast study. Lungs/Pleura: No pneumothorax. No pleural effusion. Prominent eventration of the right hemidiaphragm with moderate passive right lung base atelectasis. Moderate compressive atelectasis in the medial left lower lobe from the large hiatal hernia. Otherwise no acute consolidative airspace disease, lung masses or gross pulmonary nodules on the motion degraded images. Musculoskeletal: Healed lateral right sixth through eighth rib fractures. Incompletely healed posterolateral left eighth and ninth rib fractures. Chronic patchy lucencies throughout visualized osseous structures, not appreciably changed. No new discrete lesions in the chest. Chronic mild superior T12 vertebral compression fracture, unchanged. CT ABDOMEN PELVIS FINDINGS Hepatobiliary: Mild diffuse hepatic steatosis. Normal liver size. No liver masses. Cholelithiasis. No gallbladder wall thickening or pericholecystic fluid. No biliary ductal dilatation. Pancreas: Normal, with no mass or duct dilation. Spleen: Normal size. No mass. Adrenals/Urinary Tract: Normal adrenals. No hydronephrosis. No renal stones. Simple 1.5 cm lower left renal cyst, for which no follow-up imaging is recommended. No additional contour deforming renal lesions. Normal bladder. Stomach/Bowel: Large hiatal hernia. Stomach is not significantly distended. No acute gastric abnormality on these noncontrast motion degraded images. Small right inguinal hernia contains a small bowel loop, chronic and unchanged, with no dilated or thick-walled small bowel loops. No focal small bowel caliber transition. No pneumatosis. Appendix not discretely visualized. Mild sigmoid diverticulosis with no large bowel wall thickening or significant pericolonic fat stranding. Vascular/Lymphatic: Atherosclerotic nonaneurysmal abdominal aorta. No pathologically enlarged lymph nodes in the abdomen or pelvis. Reproductive: Normal size  prostate. Other: No pneumoperitoneum, ascites or focal fluid collection. Musculoskeletal: Mild patchy lucencies throughout the spine and pelvic girdle, chronic and not appreciably changed. No new focal osseous lesions. Moderate multilevel lumbar degenerative disc disease. IMPRESSION: 1. Substantially motion degraded scan, limiting assessment. No acute abnormality in the chest, abdomen or pelvis. 2. Chronic small right inguinal hernia containing a small bowel loop, with no evidence of acute bowel complication. No evidence of bowel obstruction or acute bowel inflammation. Mild sigmoid diverticulosis. 3. Prominent eventration of the right hemidiaphragm with moderate passive right lung base atelectasis. 4. Large hiatal hernia with moderate compressive atelectasis in the medial left lower lobe. 5. Three-vessel coronary atherosclerosis. 6. Mild diffuse hepatic steatosis. 7. Cholelithiasis. 8. Chronic patchy lucencies throughout the visualized osseous structures, compatible with reported history of multiple myeloma. No new focal osseous lesions. Chronic mild superior T12 vertebral compression fracture. 9.  Aortic Atherosclerosis (ICD10-I70.0). Electronically Signed   By: Delbert Phenix M.D.   On: 05/05/2023 19:20   DG Chest Portable 1 View  Result Date: 05/05/2023 CLINICAL DATA:  Dyspnea EXAM: PORTABLE CHEST 1 VIEW COMPARISON:  01/11/2023 chest radiograph. FINDINGS: Right rotated chest radiograph. Stable mild cardiomegaly. Left retrocardiac lucency, suspect at least moderate hiatal hernia, unchanged. Stable mediastinal contour. No  pneumothorax. No pleural effusion. Chronic prominent elevation of the right hemidiaphragm. No pulmonary edema. Mild bibasilar atelectasis. No acute consolidative airspace disease. IMPRESSION: 1. Stable mild cardiomegaly without pulmonary edema. 2. Chronic prominent elevation of the right hemidiaphragm with mild bibasilar atelectasis. 3. Stable left retrocardiac lucency, suspect at least  moderate hiatal hernia. Electronically Signed   By: Delbert Phenix M.D.   On: 05/05/2023 17:43   CT Head Wo Contrast  Result Date: 04/20/2023 CLINICAL DATA:  Head and neck trauma, fall. EXAM: CT HEAD WITHOUT CONTRAST CT CERVICAL SPINE WITHOUT CONTRAST TECHNIQUE: Multidetector CT imaging of the head and cervical spine was performed following the standard protocol without intravenous contrast. Multiplanar CT image reconstructions of the cervical spine were also generated. RADIATION DOSE REDUCTION: This exam was performed according to the departmental dose-optimization program which includes automated exposure control, adjustment of the mA and/or kV according to patient size and/or use of iterative reconstruction technique. COMPARISON:  01/11/2023. FINDINGS: CT HEAD FINDINGS Brain: No acute intracranial hemorrhage, midline shift or mass effect. No extra-axial fluid collection. Periventricular white matter hypodensities are present bilaterally. No hydrocephalus. 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 Vascular: No hyperdense vessel or unexpected calcification. Skull: No acute fracture. Multiple scattered lucencies are present in the calvarium which are unchanged from the prior exam. Sinuses/Orbits: No acute finding. Other: None. CT CERVICAL SPINE FINDINGS Alignment: Minimal anterolisthesis at C4-C5. Skull base and vertebrae: No acute fracture. No primary bone lesion or focal pathologic process. Soft tissues and spinal canal: No prevertebral fluid or swelling. No visible canal hematoma. Disc levels: Mild intervertebral disc space narrowing and uncovertebral osteophyte formation, most pronounced at C5-C7. Mild facet arthropathy bilaterally. Upper chest: No acute abnormality. Other: Carotid artery calcification. IMPRESSION: 1. Stable head CT with no acute intracranial process. 2. Multilevel degenerative changes in the cervical spine without evidence of acute fracture. Electronically Signed   By: Thornell Sartorius M.D.   On: 04/20/2023 00:36   CT Cervical Spine Wo Contrast  Result Date: 04/20/2023 CLINICAL DATA:  Head and neck trauma, fall. EXAM: CT HEAD WITHOUT CONTRAST CT CERVICAL SPINE WITHOUT CONTRAST TECHNIQUE: Multidetector CT imaging of the head and cervical spine was performed following the standard protocol without intravenous contrast. Multiplanar CT image reconstructions of the cervical spine were also generated. RADIATION DOSE REDUCTION: This exam was performed according to the departmental dose-optimization program which includes automated exposure control, adjustment of the mA and/or kV according to patient size and/or use of iterative reconstruction technique. COMPARISON:  01/11/2023. FINDINGS: CT HEAD FINDINGS Brain: No acute intracranial hemorrhage, midline shift or mass effect. No extra-axial fluid collection. Periventricular white matter hypodensities are present bilaterally. No hydrocephalus. 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 Vascular: No hyperdense vessel or unexpected calcification. Skull: No acute fracture. Multiple scattered lucencies are present in the calvarium which are unchanged from the prior exam. Sinuses/Orbits: No acute finding. Other: None. CT CERVICAL SPINE FINDINGS Alignment: Minimal anterolisthesis at C4-C5. Skull base and vertebrae: No acute fracture. No primary bone lesion or focal pathologic process. Soft tissues and spinal canal: No prevertebral fluid or swelling. No visible canal hematoma. Disc levels: Mild intervertebral disc space narrowing and uncovertebral osteophyte formation, most pronounced at C5-C7. Mild facet arthropathy bilaterally. Upper chest: No acute abnormality. Other: Carotid artery calcification. IMPRESSION: 1. Stable head CT with no acute intracranial process. 2. Multilevel degenerative changes in the cervical spine without evidence of acute fracture. Electronically Signed   By: Charlestine Night.D.  On: 04/20/2023 00:36     Labs:  CBC: Recent Labs    05/08/23 0107 05/08/23 1430 05/09/23 0252 05/09/23 2357  WBC 3.1* 2.3* 2.3* 2.4*  HGB 8.7* 9.1* 8.9* 9.2*  HCT 25.8* 27.9* 27.0* 27.7*  PLT 25* 25* 30* 27*    COAGS: No results for input(s): "INR", "APTT" in the last 8760 hours.  BMP: Recent Labs    05/05/23 1901 05/06/23 0155 05/07/23 0720 05/08/23 0107 05/09/23 2357  NA 138  --  137 135 136  K 4.5  --  4.2 3.6 4.2  CL 101  --  104 105 102  CO2 24  --  24 24 26   GLUCOSE 106*  --  98 94 95  BUN 21  --  15 16 19   CALCIUM 9.4  --  9.0 8.8* 9.1  CREATININE 1.08 1.19 0.87 1.04 0.91  GFRNONAA >60 >60 >60 >60 >60    LIVER FUNCTION TESTS: Recent Labs    02/14/23 0801 03/14/23 0755 04/09/23 1259 05/05/23 1901  BILITOT 0.5 0.5 0.5 0.5  AST 22 25 23  35  ALT 18 21 19 29   ALKPHOS 70 88 82 87  PROT 6.2* 7.0 6.5 6.3*  ALBUMIN 3.9 4.3 4.1 3.7    TUMOR MARKERS: No results for input(s): "AFPTM", "CEA", "CA199", "CHROMGRNA" in the last 8760 hours.  Assessment and Plan:  Scheduled for Bone marrow biopsy in IR today Risks and benefits of bone marrow biopsy was discussed with the patient and/or patient's family including, but not limited to bleeding, infection, damage to adjacent structures or low yield requiring additional tests.  All of the questions were answered and there is agreement to proceed.  Consent signed and in chart.  Thank you for this interesting consult.  I greatly enjoyed meeting Andre [name] and look forward to participating in their care.  A copy of this report was sent to the requesting provider on this date.  Electronically Signed: Robet Leu, PA-C 05/10/2023, 8:16 AM   I spent a total of 20 Minutes    in face to face in clinical consultation, greater than 50% of which was counseling/coordinating care for Bone marrow biopsy

## 2023-05-10 NOTE — Procedures (Signed)
Pre-procedure Diagnosis: History of Myeloma, now with pancytopenia. Post-procedure Diagnosis: Same  Technically successful CT guided bone marrow aspiration and biopsy of left iliac crest.   Complications: None Immediate  EBL: None  Signed: Simonne Come Pager: [phone] 05/10/2023, 10:30 AM

## 2023-05-10 NOTE — Telephone Encounter (Signed)
Confirmed with Mrs. Weare that Dr. Truett Perna was in agreement with him having his bone marrow biopsy today as inpatient. She reports he is being d/c to [facility] rehab unit this week. Asking if he will need to have IVIG before 9/18 since he missed his dose on 8/21? Will speak w/MD and get back in touch with her.

## 2023-05-10 NOTE — Progress Notes (Signed)
Occupational Therapy Treatment Patient Details Name: Andre [name] MRN: [id number] DOB: December 21, 1950 Today's Date: 05/10/2023   History of present illness Pt is a 71yo male who present to ED after being found down in his bed with gurgling respirations, + emesis, and  suctioned for large amount of gastric contents. Pt was hypoxemic. Found to have apiration pneumonia PMH: mulitple myeloma, stem cell transplant, CAR-T therapy in January-slow response to treatment, thyroid disease, HTN, GERD, duodenal ulcer, depression   OT comments  Patient post biopsy, L hip tender, but not impacting mobility.  Patient moving better, closer to supervision than CGA.  Able t complete light grooming with supervision and needing CGA to Min A for lower body ADL with increased time and rest breaks.  OT to continue efforts in the acute setting to address deficits, and Patient will benefit from continued inpatient follow up therapy, <3 hours/day       If plan is discharge home, recommend the following:  A little help with walking and/or transfers;A little help with bathing/dressing/bathroom;Assist for transportation;Assistance with cooking/housework;Direct supervision/assist for financial management;Direct supervision/assist for medications management   Equipment Recommendations  None recommended by OT    Recommendations for Other Services      Precautions / Restrictions Precautions Precautions: Fall Precaution Comments: watch SpO2 Restrictions Weight Bearing Restrictions: No       Mobility Bed Mobility Overal bed mobility: Needs Assistance Bed Mobility: Supine to Sit     Supine to sit: Supervision, HOB elevated       Patient Response: Cooperative  Transfers Overall transfer level: Needs assistance Equipment used: None Transfers: Sit to/from Stand, Bed to chair/wheelchair/BSC Sit to Stand: Contact guard assist     Step pivot transfers: Contact guard assist           Balance Overall  balance assessment: Needs assistance Sitting-balance support: Feet supported, Bilateral upper extremity supported Sitting balance-Leahy Scale: Fair     Standing balance support: No upper extremity supported Standing balance-Leahy Scale: Fair                             ADL either performed or assessed with clinical judgement   ADL       Grooming: Wash/dry hands;Oral care;Standing;Supervision/safety               Lower Body Dressing: Minimal assistance;Sit to/from stand   Toilet Transfer: Contact guard assist;Rolling walker (2 wheels);Regular Toilet;Ambulation                  Extremity/Trunk Assessment Upper Extremity Assessment Upper Extremity Assessment: Generalized weakness   Lower Extremity Assessment Lower Extremity Assessment: Defer to PT evaluation   Cervical / Trunk Assessment Cervical / Trunk Assessment: Kyphotic    Vision Baseline Vision/History: 1 Wears glasses Patient Visual Report: No change from baseline     Perception Perception Perception: Within Functional Limits   Praxis Praxis Praxis: WFL    Cognition Arousal: Alert Behavior During Therapy: Flat affect Overall Cognitive Status: Within Functional Limits for tasks assessed Area of Impairment: Memory, Problem solving                     Memory: Decreased short-term memory       Problem Solving: Decreased initiation          Exercises      Shoulder Instructions       General Comments  VSS on 2    Pertinent Vitals/ Pain  Pain Assessment Faces Pain Scale: Hurts a little bit Pain Location: L hip post biopsey Pain Descriptors / Indicators: Tender Pain Intervention(s): Monitored during session                                                          Frequency  Min 1X/week        Progress Toward Goals  OT Goals(current goals can now be found in the care plan section)  Progress towards OT goals: Progressing  toward goals  Acute Rehab OT Goals OT Goal Formulation: With patient Time For Goal Achievement: 05/22/23 Potential to Achieve Goals: Good  Plan      Co-evaluation                 AM-PAC OT "6 Clicks" Daily Activity     Outcome Measure   Help from another person eating meals?: None Help from another person taking care of personal grooming?: A Little Help from another person toileting, which includes using toliet, bedpan, or urinal?: A Little Help from another person bathing (including washing, rinsing, drying)?: A Little Help from another person to put on and taking off regular upper body clothing?: None Help from another person to put on and taking off regular lower body clothing?: A Little 6 Click Score: 20    End of Session Equipment Utilized During Treatment: Oxygen  OT Visit Diagnosis: Unsteadiness on feet (R26.81)   Activity Tolerance Patient tolerated treatment well   Patient Left in chair;with call bell/phone within reach;with chair alarm set;with family/visitor present   Nurse Communication Mobility status        Time: 1038-1100 OT Time Calculation (min): 22 min  Charges: OT General Charges $OT Visit: 1 Visit OT Treatments $Self Care/Home Management : 8-22 mins  05/10/2023  RP, OTR/L  Acute Rehabilitation Services  Office:  [phone]   Suzanna Obey 05/10/2023, 11:06 AM

## 2023-05-10 NOTE — Progress Notes (Signed)
IP PROGRESS NOTE  Subjective:   Andre [name] is known to me with a history of multiple myeloma, status post CAR-T therapy.  He has persistent pancytopenia following CAR-T therapy. He has no complaint today. . Objective: Vital signs in last 24 hours: Blood pressure 123/78, pulse 75, temperature 97.7 F (36.5 C), temperature source Oral, resp. rate 20, weight 176 lb 1.6 oz (79.9 kg), SpO2 97%.  Intake/Output from previous day: 08/21 0701 - 08/22 0700 In: -  Out: 2400 [Urine:2400]  Physical Exam:  HEENT: No thrush Lungs: Decreased breath sounds at the lower posterior chest, no respiratory distress Cardiac: Regular rate and rhythm Abdomen: No hepatosplenomegaly Extremities: No leg edema Neurologic: Alert, follows commands, oriented to place and diagnosis   Lab Results: Recent Labs    05/09/23 2357 05/10/23 1044  WBC 2.4* 2.6*  HGB 9.2* 10.1*  HCT 27.7* 30.9*  PLT 27* 30*    BMET Recent Labs    05/08/23 0107 05/09/23 2357  NA 135 136  K 3.6 4.2  CL 105 102  CO2 24 26  GLUCOSE 94 95  BUN 16 19  CREATININE 1.04 0.91  CALCIUM 8.8* 9.1    No results found for: "CEA1", "CEA", "XBM841", "CA125"  Studies/Results: CT BONE MARROW BIOPSY & ASPIRATION  Result Date: 05/10/2023 INDICATION: History of multiple myeloma, now with pancytopenia. Please perform CT-guided bone marrow biopsy for tissue diagnostic purposes. EXAM: CT-GUIDED BONE MARROW BIOPSY AND ASPIRATION MEDICATIONS: None ANESTHESIA/SEDATION: Moderate (conscious) sedation was employed during this procedure as administered by the Interventional Radiology RN. A total of Versed 1 mg and Fentanyl 75 mcg was administered intravenously. Moderate Sedation Time: 10 minutes. [name] level of consciousness and vital signs were monitored continuously by radiology nursing throughout the procedure under my direct supervision. COMPLICATIONS: None immediate. PROCEDURE: Informed consent was obtained from the patient following  an explanation of the procedure, risks, benefits and alternatives. The patient understands, agrees and consents for the procedure. All questions were addressed. A time out was performed prior to the initiation of the procedure. The patient was positioned prone and non-contrast localization CT was performed of the pelvis to demonstrate the iliac marrow spaces. The operative site was prepped and draped in the usual sterile fashion. Under sterile conditions and local anesthesia, a 22 gauge spinal needle was utilized for procedural planning. Next, an 11 gauge coaxial bone biopsy needle was advanced into the left iliac marrow space. Needle position was confirmed with CT imaging. Initially, a bone marrow aspiration was performed. Next, a bone marrow biopsy was obtained with the 11 gauge outer bone marrow device. Samples were prepared with the cytotechnologist and deemed adequate. The needle was removed and superficial hemostasis was obtained with manual compression. A dressing was applied. The patient tolerated the procedure well without immediate post procedural complication. IMPRESSION: Successful CT guided left iliac bone marrow aspiration and core biopsy. Electronically Signed   By: Simonne Come M.D.   On: 05/10/2023 11:18   DG Swallowing Func-Speech Pathology  Result Date: 05/09/2023 Table formatting from the original result was not included. Modified Barium Swallow Study Patient Details Name: Andre [name] MRN: [id number] Date of Birth: [birth year] Today's Date: 05/09/2023 HPI/PMH: HPI: Patient is a 72 y.o. male with PMH: GERD, HTN, HLD, thyroid disease, multiple myeloma, large hiatal hernia, dysphagia. He had an MBS at a different hospital with SLP reporting penetration with thin liquids via straw sips but not with cup sips. He presented to the hospital on 05/05/23 after being found down in  his bed with gurgling respirations, emesis and was suctioned for large amount of gastric contents; he was hypoxemic and found  to have aspiration PNA. Clinical Impression: Clinical Impression: Pt presents with a mild oropharyngeal dysphagia characterized primarily by impaired timing for which he compensates for independently. Pt has trace vallecular residue with thicker consistencies, which he senses and independently clears his thoat intermittently to clear from his pharynx. With larger boluses, the head of the bolus reaches the laryngeal vestibule prior to epiglottic inversion and laryngeal elevation being initiated and results in trace, transient penetration (PAS 2) of thin liquids and nectar thick liquids via cup and straw. This is considered a normal age variant of swallowing and appears consistent with pt's previous MBS (10/20/22). This appeared to resolve with small, single cup sips. No further penetration or aspiration noted with honey thick liquids, purees, or solids. Pt was given the pill with thin liquids, which was noted to be retained in the esophagus during the flouro sweep. SLP provided subsequent bites of purees x2, which cleared the pill from the esophagus completely. Recommend pt continue diet of Dys 3 textures with thin liquids via small, controlled cup sips. No further SLP f/u is necessary at this time. Factors that may increase risk of adverse event in presence of aspiration Andre [name]): Factors that may increase risk of adverse event in presence of aspiration Andre [name]): Reduced cognitive function Recommendations/Plan: Swallowing Evaluation Recommendations Swallowing Evaluation Recommendations Recommendations: PO diet PO Diet Recommendation: Dysphagia 3 (Mechanical soft); Thin liquids (Level 0) Liquid Administration via: Cup; No straw Medication Administration: Whole meds with puree Supervision: Staff to assist with self-feeding; Full supervision/cueing for swallowing strategies Swallowing strategies  : Minimize environmental distractions; Slow rate; Small bites/sips Postural changes: Position pt  fully upright for meals Oral care recommendations: Oral care BID (2x/day) Treatment Plan Treatment Plan Treatment recommendations: No treatment recommended at this time Follow-up recommendations: No SLP follow up Functional status assessment: Patient has not had a recent decline in their functional status. Recommendations Recommendations for follow up therapy are one component of a multi-disciplinary discharge planning process, led by the attending physician.  Recommendations may be updated based on patient status, additional functional criteria and insurance authorization. Assessment: Orofacial Exam: Orofacial Exam Oral Cavity: Oral Hygiene: WFL Oral Cavity - Dentition: Adequate natural dentition Orofacial Anatomy: WFL Oral Motor/Sensory Function: WFL Anatomy: Anatomy: Suspected cervical osteophytes Boluses Administered: Boluses Administered Boluses Administered: Thin liquids (Level 0); Mildly thick liquids (Level 2, nectar thick); Moderately thick liquids (Level 3, honey thick); Puree; Solid  Oral Impairment Domain: Oral Impairment Domain Lip Closure: No labial escape Tongue control during bolus hold: Cohesive bolus between tongue to palatal seal Bolus preparation/mastication: Timely and efficient chewing and mashing Bolus transport/lingual motion: Brisk tongue motion Oral residue: Complete oral clearance Location of oral residue : N/A Initiation of pharyngeal swallow : Valleculae  Pharyngeal Impairment Domain: Pharyngeal Impairment Domain Soft palate elevation: No bolus between soft palate (SP)/pharyngeal wall (PW) Laryngeal elevation: Complete superior movement of thyroid cartilage with complete approximation of arytenoids to epiglottic petiole Anterior hyoid excursion: Complete anterior movement Epiglottic movement: Complete inversion Laryngeal vestibule closure: Incomplete, narrow column air/contrast in laryngeal vestibule Pharyngeal stripping wave : Present - complete Pharyngeal contraction (A/P view only):  N/A Pharyngoesophageal segment opening: Complete distension and complete duration, no obstruction of flow Tongue base retraction: No contrast between tongue base and posterior pharyngeal wall (PPW) Pharyngeal residue: Trace residue within or on pharyngeal structures Location of pharyngeal residue: Valleculae  Esophageal Impairment Domain: Esophageal Impairment Domain Esophageal clearance upright position: Esophageal retention Pill: Pill Consistency administered: Thin liquids (Level 0); Puree Thin liquids (Level 0): Impaired (see clinical impressions) Puree: Impaired (see clinical impressions) Penetration/Aspiration Scale Score: Penetration/Aspiration Scale Score 1.  Material does not enter airway: Moderately thick liquids (Level 3, honey thick); Puree; Solid; Pill 2.  Material enters airway, remains ABOVE vocal cords then ejected out: Thin liquids (Level 0); Mildly thick liquids (Level 2, nectar thick) Compensatory Strategies: Compensatory Strategies Compensatory strategies: Yes Straw: Ineffective Ineffective Straw: Thin liquid (Level 0)   General Information: Caregiver present: No  Diet Prior to this Study: Dysphagia 3 (mechanical soft); Thin liquids (Level 0)   Temperature : Normal   Respiratory Status: WFL   Supplemental O2: Nasal cannula   History of Recent Intubation: No  Behavior/Cognition: Alert; Cooperative; Pleasant mood Self-Feeding Abilities: Needs assist with self-feeding Baseline vocal quality/speech: Normal Volitional Cough: Able to elicit Volitional Swallow: Able to elicit Exam Limitations: No limitations Goal Planning: Prognosis for improved oropharyngeal function: Good Barriers to Reach Goals: Cognitive deficits No data recorded Patient/Family Stated Goal: nothing specific No data recorded Pain: Pain Assessment Pain Assessment: No/denies pain End of Session: Start Time:SLP Start Time (ACUTE ONLY): 0935 Stop Time: SLP Stop Time (ACUTE ONLY): 0958 Time Calculation:SLP Time Calculation (min) (ACUTE  ONLY): 23 min Charges: SLP Evaluations $ SLP Speech Visit: 1 Visit SLP Evaluations $BSS Swallow: 1 Procedure $MBS Swallow: 1 Procedure SLP visit diagnosis: SLP Visit Diagnosis: Dysphagia, pharyngeal phase (R13.13) Past Medical History: Past Medical History: Diagnosis Date  Allergy   Anemia   Arthritis   Asthma   no treatment x 20 years  Depression   Double vision   occurs at times   Duodenal ulcer   GERD (gastroesophageal reflux disease)   Hyperlipidemia   Hypertension   Hypothyroidism   Multiple myeloma 07/04/2011  Thyroid disease  Past Surgical History: Past Surgical History: Procedure Laterality Date  BONE MARROW TRANSPLANT  2011  for MM  CARDIOLITE STUDY  11/25/2003  NORMAL  TONSILLECTOMY   Gwynneth Aliment, M.A., CF-SLP Speech Language Pathology, Acute Rehabilitation Services Secure Chat preferred [phone] 05/09/2023, 1:49 PM   Medications: I have reviewed the patient's current medications.  Assessment/Plan: Multiple myeloma- IgG lambda  bortezomib (subcutaneously), lenalidomide, and dexamethasone, with repeat bone marrow biopsy May of 2012 showing 10% plasmacytosis   (2) high-dose chemotherapy with BCNU and melphalan at [facility], followed by stem cell rescue July of 2012   (3) on zoledronic acid started December of 2012, initially monthly, currently given every 3 months, most recent dose  12/07/2015   (4) low-dose lenalidomide resumed April 2013, interrupted several times.  Resumed again on 02/19/2013, ata dose of 5 mg daily, 21 days on and 7 days off, later further reduced to 7 days on, 7 days off   (5) CNS symptoms and abnormal brain MRI extensively evaluated by neurology with no definitive diagnosis established   (6) rising M spike noted June 2014 but did not meet criteria for carfilzomib study   (7) on lenalidomide 25 mg daily, 14 days on, 7 days off, starting 04/18/2013, interrupted December 2014 because of rash;             (a) resumed January 2015 at 10 mg/ day at 21 days on/ 7 days off              (b) starting 08/18/2014 decreased to 10 mg/ day 14 days on, 7 days off because of cytopenias             (  c) as of February 2016 was on 5 mg daily 7 days on 7 days off             (d) lenalidomide discontinued December 2016 with evidence of disease progression   (8) transient global amnesia 05/29/2015, resolved without intervention   (9) starting PVD 10/25/2015 w ASA 325 thromboprophylaxis, valacyclovir prophylaxis, last dose 12/17/2015             (a) pomalidomide 4 mg/d days 1-14             (b) bortezomib sQ days 2,5,9,12 of each 21 day cycle             (c) dexamethasome 20 mg two days a week             (d) dexamethasone bortezomib and pomalidomide discontinued late December 2018 with poor tolerance   (10) metapneumovirus pneumonia April 2017             (a) completing course of steroids and week of bactrim mid April 2017   (11) status post second autologous transplant at [facility] 02/04/2016(preparatory regimen melphalan 200 mg/m)             (a) received twelve-month vaccinations 03/14/2017 (DPT, Haemophilus, Pneumovax 13, polio)             (b) 14 months injections 05/04/2016 include DTaP, Hib conjugate, HepB energex B 20 mcg/ml, Prevnar 13             (c) 24 month vaccines due at [facility] June 2019   (12) maintenance therapy started November 2017, consisting of             (a) bortezomib 1.3 mg/M2 every 14 days, first dose 07/27/2016             (b) pomalidomide 1 mg days 1-21 Q28 days, started 07/19/2016             (c) zolendronate monthly started 07/27/2016 (previously Q 3 months) however patient unable to tolerate, and changed back to q3 months in April, 2018             (d) Bortezomib changed to monthly as of June 2018 because of tolerance issues, however discontinued after 09/14/2017 dose because of a rise in his M spike             (e) pomalidomide held after 10/18/2017 in preparation for possible study at [facility] (venetoclax)--never resumed             (f) with numbers actually  improved off treatment, resumed every 2-week bortezomib 12/25/2017             (g) changed to every 3-week bortezomib as of 03/17/2019             (h) briefly on weekly treatments times 22 October 2020 due to increase in M spike             (I) maintenance therapy discontinued with evidence of progression   (13) bortezomib/daratumumab/dexamethasone started 12/20/2020.             (a) Decadron dose dropped to 10 mg day of and day following treatment starting May 2022             (b) day 8 bortezomib omitted beginning with the June cycle per patient preference             (C) treatment changed to monthly daratumumab/Velcade/Decadron beginning 06/27/2021-patient decision (14) weekly Velcade/Cytoxan beginning 11/14/2021 (15) changed to Cytoxan/carfilzomib/Decadron 12/19/2021-treatment  placed on hold 05/15/2022, carfilzomib 05/24/2022 and 05/31/2022 16.  Leukocyte a pheresis procedure at [facility] 06/15/2022 17.  Treatment resumed with Cytoxan/carfilzomib/Decadron 06/28/2022, last given 09/06/2022 18.  CAR-T 09/26/2022 with Carvykti 19.  CRS and ICANS following CAR-T therapy, status post a Decadron taper completed 10/08/2022 20.  Presentation to the emergency room 10/30/2022 with failure to thrive and a fall  21.  Pancytopenia secondary to multiple myeloma and CAR-T therapy-improved 22.  Bone marrow biopsy 11/13/2022-mildly hypocellular bone marrow with relative erythroid hyperplasia and decreased megakaryocytes; no plasma cells identified by differential count or CD138 immunohistochemical stain; flow cytometry negative for a clonal plasma cell population.  23.  Cognitive impairment predating CAR-T therapy and worsened following CAR-T therapy 24.  Admission 05/05/2023 with Pseudomonas sepsis CTs chest, abdomen, and pelvis-right lung base atelectasis, no infiltrate   Andre [name] has multiple myeloma.  He underwent CAR-T therapy in January.  He is in remission from myeloma, but has persistent pancytopenia.   He has hypogammaglobulinemia and receives monthly IVIG therapy at the cancer center.  He is admitted with Pseudomonas bacteremia/sepsis.  No clear source for infection has been identified.  He appears to have recovered from the bacteremia.  Andre [name] appears to be at baseline today.  He has persistent pancytopenia following CAR-T therapy.  He was scheduled for a diagnostic bone marrow biopsy 05/14/2023.  I ordered the bone marrow biopsy to be performed while he is in the hospital.     Recommendations: Continue antibiotics management of aspiratory failure per the medical service Proceed with diagnostic bone marrow biopsy today Outpatient follow-up at the Cancer center as scheduled Please call oncology as needed   LOS: 5 days   Thornton Papas, MD   05/10/2023, 1:24 PM

## 2023-05-10 NOTE — Progress Notes (Signed)
PROGRESS NOTE    Andre [name]  ZOX:[id number] DOB: 09-25-1950 DOA: 05/05/2023 PCP: [name], MD   Brief Narrative:  72 year old gentleman with past medical history significant for multiple myeloma s/p stem cell transplant, prior CAR-TEE in January with Carvykti, history of pancytopenia, hypertension and hypothyroidism admitted on 05/05/2023 with concern of altered mental status.  He was found down in his bed with gurgling respiration, had an emesis which was suctioned for large amount of gastric contents.  He was significantly hypoxic by EMS.   Initially admitted with concern of sepsis, blood cultures positive for Pseudomonas aeruginosa, initially received broad-spectrum antibiotics with cefepime and vancomycin and later cefepime was continued.   8/19: Vitals with mild tachypnea, on 4 L of oxygen.  No baseline oxygen use, CBC with worsening hemoglobin at 7.4, platelets 21.  Patient has an history of Hemoccult positive stool, have recently saw GI, they were planning EGD and colonoscopy if medically stable.  Giving 1 unit of blood and consulting oncology for worsening thrombocytopenia, oncology recommended checking IgG and if level is low they will give him another dose of IVIG, apparently getting monthly IVIG's infusion. PT and OT are recommending SNF   8/20: Vital stable, hemoglobin at 8.7 s/p 1 unit of PRBC, platelets 25, IgG labs pending.  Initial blood cultures with Pseudomonas aeruginosa-good sensitivity. Repeating blood culture.  Swallow team is recommending barium swallow studies. ID was consulted to help with type and duration of antibiotic due to his immunocompromise status.  Assessment & Plan:   Principal Problem:   Acute hypoxemic respiratory failure (HCC) Active Problems:   Sepsis (HCC)   Pancytopenia (HCC)   Multiple myeloma not having achieved remission (HCC)   Hypothyroidism   GERD (gastroesophageal reflux disease)   Physical deconditioning  Acute hypoxemic  respiratory failure (HCC) Likely secondary to aspiration pneumonia versus pneumonitis.  CT chest with bibasilar atelectasis.  No baseline oxygen use.  Has been weaned to room air now.   Sepsis (HCC) / Pseudomonas aeruginosa bacteremia. Patient met sepsis criteria with fever, leukocytosis, blood cultures positive for Pseudomonas aeruginosa, good sensitivity.  Initially received broad-spectrum antibiotics with cefepime and vancomycin, vancomycin was later discontinued.  Repeat blood culture from 05/08/2023 negative so far.  Seen by ID, they recommended total 7 days of antibiotics and transition to ciprofloxacin for 4 more days on 05/09/2023 with EOT on 05/13/2023.   Pancytopenia (HCC)/history of multiple myeloma Patient has an history of chronic pancytopenia secondary to CAR-T therapy with Carvykti. S/p stem cell transplant in [place] Hemoglobin improved to 8.7 s/p 1 unit of PRBC, platelet 30.  Oncology on board and patient is scheduled for bone marrow biopsy today.   Hypothyroidism -Continue home Synthroid   GERD (gastroesophageal reflux disease) -Continue home Protonix   Physical deconditioning PT and OT are recommending SNF  DVT prophylaxis: SCDs Start: 05/05/23 2313   Code Status: DNR  Family Communication:  None present at bedside.  Plan of care discussed with patient in length and he/she verbalized understanding and agreed with it.  Status is: Inpatient Remains inpatient appropriate because: Patient is going to have bone marrow biopsy today.  Potential discharge tomorrow if he does well.   Estimated body mass index is 27.58 kg/m as calculated from the following:   Height as of 05/02/23: 5\' 7"  (1.702 m).   Weight as of this encounter: 79.9 kg.    Nutritional Assessment: Body mass index is 27.58 kg/m.[name] Seen by dietician.  I agree with the assessment and plan as outlined  below: Nutrition Status: Nutrition Problem: Swallowing difficulty Etiology: dysphagia Signs/Symptoms: other  (comment) (SLP assessment) Interventions: Ensure Enlive (each supplement provides 350kcal and 20 grams of protein), Education, Refer to RD note for recommendations  . Skin Assessment: I have examined the patient's skin and I agree with the wound assessment as performed by the wound care RN as outlined below:    Consultants:  Oncology and ID  Procedures:  As above  Antimicrobials:  Anti-infectives (From admission, onward)    Start     Dose/Rate Route Frequency Ordered Stop   05/09/23 1130  ciprofloxacin (CIPRO) tablet 750 mg        750 mg Oral 2 times daily 05/09/23 1034 05/13/23 0759   05/07/23 1500  dapsone tablet 100 mg        100 mg Oral Daily 05/07/23 1403     05/07/23 1500  valACYclovir (VALTREX) tablet 500 mg        500 mg Oral Daily 05/07/23 1403     05/07/23 0900  ceFEPIme (MAXIPIME) 2 g in sodium chloride 0.9 % 100 mL IVPB  Status:  Discontinued        2 g 200 mL/hr over 30 Minutes Intravenous Every 8 hours 05/07/23 0858 05/09/23 1025   05/06/23 2300  ceFEPIme (MAXIPIME) 2 g in sodium chloride 0.9 % 100 mL IVPB  Status:  Discontinued        2 g 200 mL/hr over 30 Minutes Intravenous Every 12 hours 05/06/23 1426 05/07/23 0858   05/06/23 1015  vancomycin (VANCOREADY) IVPB 2000 mg/400 mL  Status:  Discontinued        2,000 mg 200 mL/hr over 120 Minutes Intravenous  Once 05/06/23 0915 05/06/23 0916   05/06/23 1015  ceFEPIme (MAXIPIME) 2 g in sodium chloride 0.9 % 100 mL IVPB        2 g 200 mL/hr over 30 Minutes Intravenous  Once 05/06/23 0915 05/06/23 1151   05/05/23 2200  vancomycin (VANCOREADY) IVPB 2000 mg/400 mL        2,000 mg 200 mL/hr over 120 Minutes Intravenous  Once 05/05/23 2155 05/06/23 0327   05/05/23 2145  ceFEPIme (MAXIPIME) 2 g in sodium chloride 0.9 % 100 mL IVPB        2 g 200 mL/hr over 30 Minutes Intravenous  Once 05/05/23 2143 05/05/23 2242         Subjective: Patient seen and examined.  He was fully alert and oriented and had no complaints,  denies shortness of breath.  Objective: Vitals:   05/10/23 0439 05/10/23 0932 05/10/23 0955 05/10/23 1000  BP: 124/63 (!) 149/70 136/75 (!) 138/58  Pulse: (!) 55 62 66 68  Resp: 18 20 20  (!) 22  Temp: 97.7 F (36.5 C)     TempSrc: Oral     SpO2: 91% 95% 99% 98%  Weight: 79.9 kg       Intake/Output Summary (Last 24 hours) at 05/10/2023 1002 Last data filed at 05/10/2023 4098 Gross per 24 hour  Intake --  Output 2400 ml  Net -2400 ml   Filed Weights   05/08/23 0500 05/09/23 0349 05/10/23 0439  Weight: 81.4 kg 81.2 kg 79.9 kg    Examination:  General exam: Appears calm and comfortable  Respiratory system: Faint rhonchi at the right base. Respiratory effort normal. Cardiovascular system: S1 & S2 heard, RRR. No JVD, murmurs, rubs, gallops or clicks. No pedal edema. Gastrointestinal system: Abdomen is nondistended, soft and nontender. No organomegaly or masses felt. Normal bowel sounds  heard. Central nervous system: Alert and oriented. No focal neurological deficits. Extremities: Symmetric 5 x 5 power. Skin: No rashes, lesions or ulcers.  Psychiatry: Judgement and insight appear normal. Mood & affect appropriate.    Data Reviewed: I have personally reviewed following labs and imaging studies  CBC: Recent Labs  Lab 05/05/23 1744 05/06/23 0155 05/07/23 0720 05/07/23 2018 05/08/23 0107 05/08/23 1430 05/09/23 0252 05/09/23 2357  WBC 5.3   < > 3.8*  --  3.1* 2.3* 2.3* 2.4*  NEUTROABS 4.4  --   --   --   --  1.8  CANNOT DO DIFF WITHOUT CBC RESULTS  --  1.7  HGB 9.5*   < > 7.4* 9.3* 8.7* 9.1* 8.9* 9.2*  HCT 29.2*   < > 23.5* 28.6* 25.8* 27.9* 27.0* 27.7*  MCV 120.7*   < > 118.1*  --  108.4* 110.7* 110.2* 111.2*  PLT 37*   < > 21*  --  25* 25* 30* 27*   < > = values in this interval not displayed.   Basic Metabolic Panel: Recent Labs  Lab 05/05/23 1901 05/06/23 0155 05/07/23 0720 05/08/23 0107 05/09/23 2357  NA 138  --  137 135 136  K 4.5  --  4.2 3.6 4.2  CL  101  --  104 105 102  CO2 24  --  24 24 26   GLUCOSE 106*  --  98 94 95  BUN 21  --  15 16 19   CREATININE 1.08 1.19 0.87 1.04 0.91  CALCIUM 9.4  --  9.0 8.8* 9.1   GFR: Estimated Creatinine Clearance: 73.2 mL/min (by C-G formula based on SCr of 0.91 mg/dL). Liver Function Tests: Recent Labs  Lab 05/05/23 1901  AST 35  ALT 29  ALKPHOS 87  BILITOT 0.5  PROT 6.3*  ALBUMIN 3.7   Recent Labs  Lab 05/05/23 1901  LIPASE 30   No results for input(s): "AMMONIA" in the last 168 hours. Coagulation Profile: No results for input(s): "INR", "PROTIME" in the last 168 hours. Cardiac Enzymes: No results for input(s): "CKTOTAL", "CKMB", "CKMBINDEX", "TROPONINI" in the last 168 hours. BNP (last 3 results) No results for input(s): "PROBNP" in the last 8760 hours. HbA1C: No results for input(s): "HGBA1C" in the last 72 hours. CBG: Recent Labs  Lab 05/08/23 1626 05/08/23 2203 05/08/23 2341 05/09/23 0349 05/09/23 0738  GLUCAP 117* 96 99 106* 88   Lipid Profile: No results for input(s): "CHOL", "HDL", "LDLCALC", "TRIG", "CHOLHDL", "LDLDIRECT" in the last 72 hours. Thyroid Function Tests: No results for input(s): "TSH", "T4TOTAL", "FREET4", "T3FREE", "THYROIDAB" in the last 72 hours. Anemia Panel: No results for input(s): "VITAMINB12", "FOLATE", "FERRITIN", "TIBC", "IRON", "RETICCTPCT" in the last 72 hours. Sepsis Labs: Recent Labs  Lab 05/05/23 1901 05/06/23 0156  LATICACIDVEN 1.7 0.9    Recent Results (from the past 240 hour(s))  Blood culture (routine x 2)     Status: Abnormal   Collection Time: 05/05/23  7:01 PM   Specimen: BLOOD RIGHT HAND  Result Value Ref Range Status   Specimen Description BLOOD RIGHT HAND  Final   Special Requests   Final    BOTTLES DRAWN AEROBIC AND ANAEROBIC Blood Culture results may not be optimal due to an inadequate volume of blood received in culture bottles   Culture  Setup Time   Final    GRAM NEGATIVE RODS AEROBIC BOTTLE ONLY CRITICAL  RESULT CALLED TO, READ BACK BY AND VERIFIED WITH: PHARMD T DANG [id number] AT 1420 BY EC Performed  at [facility], [address] [place], [place] [address]    Culture PSEUDOMONAS AERUGINOSA (A)  Final   Report Status 05/08/2023 FINAL  Final   Organism ID, Bacteria PSEUDOMONAS AERUGINOSA  Final      Susceptibility   Pseudomonas aeruginosa - MIC*    CEFTAZIDIME 4 SENSITIVE Sensitive     CIPROFLOXACIN <=0.25 SENSITIVE Sensitive     GENTAMICIN 2 SENSITIVE Sensitive     IMIPENEM 1 SENSITIVE Sensitive     PIP/TAZO 8 SENSITIVE Sensitive     CEFEPIME 2 SENSITIVE Sensitive     * PSEUDOMONAS AERUGINOSA  Blood Culture ID Panel (Reflexed)     Status: Abnormal   Collection Time: 05/05/23  7:01 PM  Result Value Ref Range Status   Enterococcus faecalis NOT DETECTED NOT DETECTED Final   Enterococcus Faecium NOT DETECTED NOT DETECTED Final   Listeria monocytogenes NOT DETECTED NOT DETECTED Final   Staphylococcus species NOT DETECTED NOT DETECTED Final   Staphylococcus aureus (BCID) NOT DETECTED NOT DETECTED Final   Staphylococcus epidermidis NOT DETECTED NOT DETECTED Final   Staphylococcus lugdunensis NOT DETECTED NOT DETECTED Final   Streptococcus species NOT DETECTED NOT DETECTED Final   Streptococcus agalactiae NOT DETECTED NOT DETECTED Final   Streptococcus pneumoniae NOT DETECTED NOT DETECTED Final   Streptococcus pyogenes NOT DETECTED NOT DETECTED Final   A.calcoaceticus-baumannii NOT DETECTED NOT DETECTED Final   Bacteroides fragilis NOT DETECTED NOT DETECTED Final   Enterobacterales NOT DETECTED NOT DETECTED Final   Enterobacter cloacae complex NOT DETECTED NOT DETECTED Final   Escherichia coli NOT DETECTED NOT DETECTED Final   Klebsiella aerogenes NOT DETECTED NOT DETECTED Final   Klebsiella oxytoca NOT DETECTED NOT DETECTED Final   Klebsiella pneumoniae NOT DETECTED NOT DETECTED Final   Proteus species NOT DETECTED NOT DETECTED Final   Salmonella species NOT DETECTED NOT  DETECTED Final   Serratia marcescens NOT DETECTED NOT DETECTED Final   Haemophilus influenzae NOT DETECTED NOT DETECTED Final   Neisseria meningitidis NOT DETECTED NOT DETECTED Final   Pseudomonas aeruginosa DETECTED (A) NOT DETECTED Final    Comment: CRITICAL RESULT CALLED TO, READ BACK BY AND VERIFIED WITH: PHARMD T DANG [id number] AT 1420 BY EC    Stenotrophomonas maltophilia NOT DETECTED NOT DETECTED Final   Candida albicans NOT DETECTED NOT DETECTED Final   Candida auris NOT DETECTED NOT DETECTED Final   Candida glabrata NOT DETECTED NOT DETECTED Final   Candida krusei NOT DETECTED NOT DETECTED Final   Candida parapsilosis NOT DETECTED NOT DETECTED Final   Candida tropicalis NOT DETECTED NOT DETECTED Final   Cryptococcus neoformans/gattii NOT DETECTED NOT DETECTED Final   CTX-M ESBL NOT DETECTED NOT DETECTED Final   Carbapenem resistance IMP NOT DETECTED NOT DETECTED Final   Carbapenem resistance KPC NOT DETECTED NOT DETECTED Final   Carbapenem resistance NDM NOT DETECTED NOT DETECTED Final   Carbapenem resistance VIM NOT DETECTED NOT DETECTED Final    Comment: Performed at [facility], [address], [place], [place] [address]  MRSA Next Gen by PCR, Nasal     Status: None   Collection Time: 05/06/23 12:41 AM   Specimen: Nasal Mucosa; Nasal Swab  Result Value Ref Range Status   MRSA by PCR Next Gen NOT DETECTED NOT DETECTED Final    Comment: (NOTE) The GeneXpert MRSA Assay (FDA approved for NASAL specimens only), is one component of a comprehensive MRSA colonization surveillance program. It is not intended to diagnose MRSA infection nor to guide or monitor  treatment for MRSA infections. Test performance is not FDA approved in patients less than 63 years old. Performed at [facility], [address] [place], [place] [address]   Blood culture (routine x 2)     Status: None (Preliminary result)   Collection Time: 05/06/23  1:55 AM   Specimen: BLOOD LEFT HAND   Result Value Ref Range Status   Specimen Description BLOOD LEFT HAND  Final   Special Requests   Final    BOTTLES DRAWN AEROBIC AND ANAEROBIC Blood Culture adequate volume   Culture   Final    NO GROWTH 4 DAYS Performed at [facility], [address], [place], [place] [address]    Report Status PENDING  Incomplete  Culture, blood (Routine X 2) w Reflex to ID Panel     Status: None (Preliminary result)   Collection Time: 05/08/23  8:21 AM   Specimen: BLOOD LEFT FOREARM  Result Value Ref Range Status   Specimen Description BLOOD LEFT FOREARM  Final   Special Requests   Final    BOTTLES DRAWN AEROBIC AND ANAEROBIC Blood Culture adequate volume   Culture   Final    NO GROWTH 2 DAYS Performed at [facility], [address], [place], [place] [address]    Report Status PENDING  Incomplete  Culture, blood (Routine X 2) w Reflex to ID Panel     Status: None (Preliminary result)   Collection Time: 05/08/23  8:21 AM   Specimen: BLOOD RIGHT HAND  Result Value Ref Range Status   Specimen Description BLOOD RIGHT HAND  Final   Special Requests   Final    BOTTLES DRAWN AEROBIC AND ANAEROBIC Blood Culture adequate volume   Culture   Final    NO GROWTH 2 DAYS Performed at [facility], [address], [place], [place] [address]    Report Status PENDING  Incomplete     Radiology Studies: DG Swallowing Func-Speech Pathology  Result Date: 05/09/2023 Table formatting from the original result was not included. Modified Barium Swallow Study Patient Details Name: Andre [name] MRN: [id number] Date of Birth: May 03, 1951 Today's Date: 05/09/2023 HPI/PMH: HPI: Patient is a 72 y.o. male with PMH: GERD, HTN, HLD, thyroid disease, multiple myeloma, large hiatal hernia, dysphagia. He had an MBS at a different hospital with SLP reporting penetration with thin liquids via straw sips but not with cup sips. He presented to the hospital on 05/05/23 after being found down in his bed with gurgling  respirations, emesis and was suctioned for large amount of gastric contents; he was hypoxemic and found to have aspiration PNA. Clinical Impression: Clinical Impression: Pt presents with a mild oropharyngeal dysphagia characterized primarily by impaired timing for which he compensates for independently. Pt has trace vallecular residue with thicker consistencies, which he senses and independently clears his thoat intermittently to clear from his pharynx. With larger boluses, the head of the bolus reaches the laryngeal vestibule prior to epiglottic inversion and laryngeal elevation being initiated and results in trace, transient penetration (PAS 2) of thin liquids and nectar thick liquids via cup and straw. This is considered a normal age variant of swallowing and appears consistent with pt's previous MBS (10/20/22). This appeared to resolve with small, single cup sips. No further penetration or aspiration noted with honey thick liquids, purees, or solids. Pt was given the pill with thin liquids, which was noted to be retained in the esophagus during the flouro sweep. SLP provided subsequent  bites of purees x2, which cleared the pill from the esophagus completely. Recommend pt continue diet of Dys 3 textures with thin liquids via small, controlled cup sips. No further SLP f/u is necessary at this time. Factors that may increase risk of adverse event in presence of aspiration [name] & Clearance Coots 2021): Factors that may increase risk of adverse event in presence of aspiration [name] & Clearance Coots 2021): Reduced cognitive function Recommendations/Plan: Swallowing Evaluation Recommendations Swallowing Evaluation Recommendations Recommendations: PO diet PO Diet Recommendation: Dysphagia 3 (Mechanical soft); Thin liquids (Level 0) Liquid Administration via: Cup; No straw Medication Administration: Whole meds with puree Supervision: Staff to assist with self-feeding; Full supervision/cueing for swallowing strategies Swallowing  strategies  : Minimize environmental distractions; Slow rate; Small bites/sips Postural changes: Position pt fully upright for meals Oral care recommendations: Oral care BID (2x/day) Treatment Plan Treatment Plan Treatment recommendations: No treatment recommended at this time Follow-up recommendations: No SLP follow up Functional status assessment: Patient has not had a recent decline in their functional status. Recommendations Recommendations for follow up therapy are one component of a multi-disciplinary discharge planning process, led by the attending physician.  Recommendations may be updated based on patient status, additional functional criteria and insurance authorization. Assessment: Orofacial Exam: Orofacial Exam Oral Cavity: Oral Hygiene: WFL Oral Cavity - Dentition: Adequate natural dentition Orofacial Anatomy: WFL Oral Motor/Sensory Function: WFL Anatomy: Anatomy: Suspected cervical osteophytes Boluses Administered: Boluses Administered Boluses Administered: Thin liquids (Level 0); Mildly thick liquids (Level 2, nectar thick); Moderately thick liquids (Level 3, honey thick); Puree; Solid  Oral Impairment Domain: Oral Impairment Domain Lip Closure: No labial escape Tongue control during bolus hold: Cohesive bolus between tongue to palatal seal Bolus preparation/mastication: Timely and efficient chewing and mashing Bolus transport/lingual motion: Brisk tongue motion Oral residue: Complete oral clearance Location of oral residue : N/A Initiation of pharyngeal swallow : Valleculae  Pharyngeal Impairment Domain: Pharyngeal Impairment Domain Soft palate elevation: No bolus between soft palate (SP)/pharyngeal wall (PW) Laryngeal elevation: Complete superior movement of thyroid cartilage with complete approximation of arytenoids to epiglottic petiole Anterior hyoid excursion: Complete anterior movement Epiglottic movement: Complete inversion Laryngeal vestibule closure: Incomplete, narrow column air/contrast in  laryngeal vestibule Pharyngeal stripping wave : Present - complete Pharyngeal contraction (A/P view only): N/A Pharyngoesophageal segment opening: Complete distension and complete duration, no obstruction of flow Tongue base retraction: No contrast between tongue base and posterior pharyngeal wall (PPW) Pharyngeal residue: Trace residue within or on pharyngeal structures Location of pharyngeal residue: Valleculae  Esophageal Impairment Domain: Esophageal Impairment Domain Esophageal clearance upright position: Esophageal retention Pill: Pill Consistency administered: Thin liquids (Level 0); Puree Thin liquids (Level 0): Impaired (see clinical impressions) Puree: Impaired (see clinical impressions) Penetration/Aspiration Scale Score: Penetration/Aspiration Scale Score 1.  Material does not enter airway: Moderately thick liquids (Level 3, honey thick); Puree; Solid; Pill 2.  Material enters airway, remains ABOVE vocal cords then ejected out: Thin liquids (Level 0); Mildly thick liquids (Level 2, nectar thick) Compensatory Strategies: Compensatory Strategies Compensatory strategies: Yes Straw: Ineffective Ineffective Straw: Thin liquid (Level 0)   General Information: Caregiver present: No  Diet Prior to this Study: Dysphagia 3 (mechanical soft); Thin liquids (Level 0)   Temperature : Normal   Respiratory Status: WFL   Supplemental O2: Nasal cannula   History of Recent Intubation: No  Behavior/Cognition: Alert; Cooperative; Pleasant mood Self-Feeding Abilities: Needs assist with self-feeding Baseline vocal quality/speech: Normal Volitional Cough: Able to elicit Volitional Swallow: Able to elicit Exam Limitations: No limitations Goal Planning: Prognosis  for improved oropharyngeal function: Good Barriers to Reach Goals: Cognitive deficits No data recorded Patient/Family Stated Goal: nothing specific No data recorded Pain: Pain Assessment Pain Assessment: No/denies pain End of Session: Start Time:SLP Start Time (ACUTE  ONLY): 0935 Stop Time: SLP Stop Time (ACUTE ONLY): 0958 Time Calculation:SLP Time Calculation (min) (ACUTE ONLY): 23 min Charges: SLP Evaluations $ SLP Speech Visit: 1 Visit SLP Evaluations $BSS Swallow: 1 Procedure $MBS Swallow: 1 Procedure SLP visit diagnosis: SLP Visit Diagnosis: Dysphagia, pharyngeal phase (R13.13) Past Medical History: Past Medical History: Diagnosis Date  Allergy   Anemia   Arthritis   Asthma   no treatment x 20 years  Depression   Double vision   occurs at times   Duodenal ulcer   GERD (gastroesophageal reflux disease)   Hyperlipidemia   Hypertension   Hypothyroidism   Multiple myeloma 07/04/2011  Thyroid disease  Past Surgical History: Past Surgical History: Procedure Laterality Date  BONE MARROW TRANSPLANT  2011  for MM  CARDIOLITE STUDY  11/25/2003  NORMAL  TONSILLECTOMY   Gwynneth Aliment, M.A., CF-SLP Speech Language Pathology, Acute Rehabilitation Services Secure Chat preferred [phone] 05/09/2023, 1:49 PM   Scheduled Meds:  sodium chloride   Intravenous Once   calcium carbonate  1 tablet Oral Q breakfast   ciprofloxacin  750 mg Oral BID   cyanocobalamin  1,000 mcg Oral Daily   dapsone  100 mg Oral Daily   escitalopram  10 mg Oral Daily   feeding supplement  237 mL Oral BID BM   ferrous sulfate  325 mg Oral BID   folic acid  1 mg Oral Daily   levothyroxine  125 mcg Oral Daily   melatonin  3 mg Oral QHS   methylphenidate  2.5 mg Oral Daily   pantoprazole  40 mg Oral Daily   valACYclovir  500 mg Oral Daily   Continuous Infusions:   LOS: 5 days   Hughie Closs, MD [facility]  05/10/2023, 10:02 AM   *Please note that this is a verbal dictation therefore any spelling or grammatical errors are due to the "[employer]agon [employer] One" system interpretation.  Please page via Amion and do not message via secure chat for urgent patient care matters. Secure chat can be used for non urgent patient care matters.  How to contact the [facility] Attending or Consulting provider 7A -  7P or covering provider during after hours 7P -7A, for this patient?  Check the care team in [facility] and look for a) attending/consulting TR[facility] provider listed and b) the [facility] team listed. Page or secure chat 7A-7P. Log into [email or web address] and use [facility] universal password to access. If you do not have the password, please contact the hospital operator. Locate the [facility] provider you are looking for under [facility] and page to a number that you can be directly reached. If you still have difficulty reaching the provider, please page the [facility] (Director on Call) for the Hospitalists listed on amion for assistance.

## 2023-05-11 DIAGNOSIS — J9601 Acute respiratory failure with hypoxia: Secondary | ICD-10-CM | POA: Diagnosis not present

## 2023-05-11 LAB — CULTURE, BLOOD (ROUTINE X 2)
Culture: NO GROWTH
Special Requests: ADEQUATE

## 2023-05-11 LAB — CBC WITH DIFFERENTIAL/PLATELET
Abs Immature Granulocytes: 0.04 10*3/uL (ref 0.00–0.07)
Basophils Absolute: 0 10*3/uL (ref 0.0–0.1)
Basophils Relative: 1 %
Eosinophils Absolute: 0.1 10*3/uL (ref 0.0–0.5)
Eosinophils Relative: 4 %
HCT: 29.9 % — ABNORMAL LOW (ref 39.0–52.0)
Hemoglobin: 9.7 g/dL — ABNORMAL LOW (ref 13.0–17.0)
Immature Granulocytes: 2 %
Lymphocytes Relative: 9 %
Lymphs Abs: 0.2 10*3/uL — ABNORMAL LOW (ref 0.7–4.0)
MCH: 35 pg — ABNORMAL HIGH (ref 26.0–34.0)
MCHC: 32.4 g/dL (ref 30.0–36.0)
MCV: 107.9 fL — ABNORMAL HIGH (ref 80.0–100.0)
Monocytes Absolute: 0.5 10*3/uL (ref 0.1–1.0)
Monocytes Relative: 23 %
Neutro Abs: 1.3 10*3/uL — ABNORMAL LOW (ref 1.7–7.7)
Neutrophils Relative %: 61 %
Platelets: 27 10*3/uL — CL (ref 150–400)
RBC: 2.77 MIL/uL — ABNORMAL LOW (ref 4.22–5.81)
RDW: 21.8 % — ABNORMAL HIGH (ref 11.5–15.5)
WBC: 2.2 10*3/uL — ABNORMAL LOW (ref 4.0–10.5)
nRBC: 0 % (ref 0.0–0.2)

## 2023-05-11 MED ORDER — METHYLPHENIDATE HCL 5 MG PO TABS: 2.5000 mg | ORAL_TABLET | Freq: Every day | ORAL | 0 refills | Status: DC

## 2023-05-11 MED ORDER — CIPROFLOXACIN HCL 750 MG PO TABS: 750.0000 mg | ORAL_TABLET | Freq: Two times a day (BID) | ORAL | 0 refills | Status: AC

## 2023-05-11 NOTE — Progress Notes (Signed)
Report to given Cherri at [facility]

## 2023-05-11 NOTE — Progress Notes (Signed)
Mobility Specialist Progress Note:    05/11/23 1103  Mobility  Activity Ambulated with assistance in hallway  Level of Assistance Contact guard assist, steadying assist  Assistive Device Front wheel walker  Distance Ambulated (ft) 450 ft  Activity Response Tolerated well  Mobility Referral Yes  $Mobility charge 1 Mobility  Mobility Specialist Start Time (ACUTE ONLY) 1050  Mobility Specialist Stop Time (ACUTE ONLY) 1101  Mobility Specialist Time Calculation (min) (ACUTE ONLY) 11 min   Received pt in chair having no complaints and agreeable to mobility. Pt was asymptomatic throughout ambulation and returned to room w/o fault. Prior to returning to recliner, pt requested to use the BR, void successful. Left in chair w/ call bell in reach and all needs met.   Thompson Grayer Mobility Specialist  Please contact vis Secure Chat or  Rehab Office [phone]

## 2023-05-11 NOTE — Discharge Summary (Signed)
Physician Discharge Summary  Andre [name] WUJ:[id number] DOB: 06-16-1951 DOA: 05/05/2023  PCP: [name], MD  Admit date: 05/05/2023 Discharge date: 05/11/2023 30 Day Unplanned Readmission Risk Score    Flowsheet Row ED to Hosp-Admission (Current) from 05/05/2023 in [facility]  30 Day Unplanned Readmission Risk Score (%) 16.27 Filed at 05/11/2023 0801       This score is the patient's risk of an unplanned readmission within 30 days of being discharged (0 -100%). The score is based on dignosis, age, lab data, medications, orders, and past utilization.   Low:  0-14.9   Medium: 15-21.9   High: 22-29.9   Extreme: 30 and above          Admitted From: Home/assisted living facility Disposition: SNF  Recommendations for Outpatient Follow-up:  Follow up with PCP in 1-2 weeks Please obtain BMP/CBC in one week Follow up with oncology in 1 to 2 weeks as they have scheduled Please follow up with your PCP on the following pending results: Unresulted Labs (From admission, onward)     Start     Ordered   05/08/23 1347  Urinalysis, w/ Reflex to Culture (Infection Suspected) -Urine, Clean Catch  (Urine Labs)  Once,   R       Question:  Specimen Source  Answer:  Urine, Clean Catch   05/08/23 1347              Home Health: None Equipment/Devices: None  Discharge Condition: Stable CODE STATUS: DNR Diet recommendation: Cardiac  Subjective: Seen and examined.  Doing well.  Fully alert and oriented.  Very excited about being discharged today.  Brief/Interim Summary: 72 year old gentleman with past medical history significant for multiple myeloma s/p stem cell transplant, prior CAR-TEE in January with Carvykti, history of pancytopenia, hypertension and hypothyroidism admitted on 05/05/2023 with concern of altered mental status.  He was found down in his bed with gurgling respiration, had an emesis which was suctioned for large amount of gastric contents.  He was  significantly hypoxic by EMS.   Initially admitted with concern of sepsis, blood cultures positive for Pseudomonas aeruginosa, initially received broad-spectrum antibiotics with cefepime and vancomycin and later cefepime was continued.  Details below.   Acute hypoxemic respiratory failure (HCC) Likely secondary to aspiration pneumonia versus pneumonitis.  CT chest with bibasilar atelectasis.  No baseline oxygen use.  Has been weaned to room air now for last 1 to 2 days.   Sepsis (HCC) / Pseudomonas aeruginosa bacteremia. Patient met sepsis criteria with fever, leukocytosis, blood cultures positive for Pseudomonas aeruginosa, good sensitivity.  Initially received broad-spectrum antibiotics with cefepime and vancomycin, vancomycin was later discontinued.  Repeat blood culture from 05/08/2023 negative so far.  Seen by ID, they recommended total 7 days of antibiotics and transition to ciprofloxacin for 4 more days on 05/09/2023 with EOT on 05/13/2023.  Prescribing 4 more doses of ciprofloxacin.   Pancytopenia (HCC)/history of multiple myeloma Patient has an history of chronic pancytopenia secondary to CAR-T therapy with Carvykti. S/p stem cell transplant in [place] Hemoglobin improved to 8.7 s/p 1 unit of PRBC and currently 9.7, platelet 27.  Oncology saw her, patient ended up having bone marrow biopsy 05/10/2023.  Oncology will follow-up with him as outpatient with results.   Hypothyroidism -Continue home Synthroid   GERD (gastroesophageal reflux disease) -Continue home Protonix   Physical deconditioning PT and OT are recommending SNF, he is being discharged in stable condition today.  Discharge plan was discussed with patient  and/or family member and they verbalized understanding and agreed with it.  Discharge Diagnoses:  Principal Problem:   Acute hypoxemic respiratory failure (HCC) Active Problems:   Sepsis (HCC)   Pancytopenia (HCC)   Multiple myeloma not having achieved remission  (HCC)   Hypothyroidism   GERD (gastroesophageal reflux disease)   Physical deconditioning    Discharge Instructions   Allergies as of 05/11/2023       Reactions   Atorvastatin Other (See Comments)   Rosuvastatin    Other reaction(s): Liver Disorder   Crestor [rosuvastatin Calcium]    ADVERSE EFFECTS ON LIVER   Lipitor [atorvastatin Calcium]    Septra [sulfamethoxazole-trimethoprim] Rash        Medication List     STOP taking these medications    triamcinolone lotion 0.1 % Commonly known as: KENALOG   triamcinolone ointment 0.1 % Commonly known as: KENALOG       TAKE these medications    acetaminophen 650 MG CR tablet Commonly known as: TYLENOL Take 650 mg by mouth every 6 (six) hours as needed for pain.   calcium carbonate 750 MG chewable tablet Commonly known as: TUMS EX Chew 2 tablets by mouth 3 (three) times daily as needed for heartburn.   calcium-vitamin D 500-200 MG-UNIT tablet Commonly known as: OSCAL WITH D Take 1 tablet by mouth daily.   ciprofloxacin 750 MG tablet Commonly known as: CIPRO Take 1 tablet (750 mg total) by mouth 2 (two) times daily for 4 doses.   cyanocobalamin 1000 MCG tablet Commonly known as: VITAMIN B12 Take 1 tablet (1,000 mcg total) by mouth daily.   dapsone 100 MG tablet Take 100 mg by mouth daily.   docusate sodium 100 MG capsule Commonly known as: COLACE Take 100 mg by mouth daily as needed for mild constipation or moderate constipation.   escitalopram 10 MG tablet Commonly known as: LEXAPRO Take 1 tablet (10 mg total) by mouth daily.   ferrous sulfate 325 (65 FE) MG EC tablet Take 325 mg by mouth 2 (two) times daily.   folic acid 1 MG tablet Commonly known as: FOLVITE Take 1 tablet (1 mg total) by mouth daily.   levothyroxine 125 MCG tablet Commonly known as: SYNTHROID Take 125 mcg by mouth daily.   methylphenidate 5 MG tablet Commonly known as: RITALIN Take 0.5 tablets (2.5 mg total) by mouth daily.  Each morning   pantoprazole 40 MG tablet Commonly known as: Protonix Take 1 tablet (40 mg total) by mouth daily.   valACYclovir 500 MG tablet Commonly known as: VALTREX Take 500 mg by mouth daily.   Vitamin D3 25 MCG (1000 UT) Caps Take 1,000 Units by mouth daily.        Follow-up Information     Connect with your PCP/Specialist as discussed. Schedule an appointment as soon as possible for a visit .   Contact information: [email or web address] Call our physician referral line at [phone].        [name], MD Follow up in 1 week(s).   Specialty: Internal Medicine Contact information: [address] [place] [address] [phone]                Allergies  Allergen Reactions   Atorvastatin Other (See Comments)   Rosuvastatin     Other reaction(s): Liver Disorder   Crestor [Rosuvastatin Calcium]     ADVERSE EFFECTS ON LIVER   Lipitor [Atorvastatin Calcium]    Septra [Sulfamethoxazole-Trimethoprim] Rash    Consultations: Oncology and PCCM  Procedures/Studies: CT BONE MARROW BIOPSY & ASPIRATION  Result Date: 05/10/2023 INDICATION: History of multiple myeloma, now with pancytopenia. Please perform CT-guided bone marrow biopsy for tissue diagnostic purposes. EXAM: CT-GUIDED BONE MARROW BIOPSY AND ASPIRATION MEDICATIONS: None ANESTHESIA/SEDATION: Moderate (conscious) sedation was employed during this procedure as administered by the Interventional Radiology RN. A total of Versed 1 mg and Fentanyl 75 mcg was administered intravenously. Moderate Sedation Time: 10 minutes. [name] level of consciousness and vital signs were monitored continuously by radiology nursing throughout the procedure under my direct supervision. COMPLICATIONS: None immediate. PROCEDURE: Informed consent was obtained from the patient following an explanation of the procedure, risks, benefits and alternatives. The patient understands, agrees and  consents for the procedure. All questions were addressed. A time out was performed prior to the initiation of the procedure. The patient was positioned prone and non-contrast localization CT was performed of the pelvis to demonstrate the iliac marrow spaces. The operative site was prepped and draped in the usual sterile fashion. Under sterile conditions and local anesthesia, a 22 gauge spinal needle was utilized for procedural planning. Next, an 11 gauge coaxial bone biopsy needle was advanced into the left iliac marrow space. Needle position was confirmed with CT imaging. Initially, a bone marrow aspiration was performed. Next, a bone marrow biopsy was obtained with the 11 gauge outer bone marrow device. Samples were prepared with the cytotechnologist and deemed adequate. The needle was removed and superficial hemostasis was obtained with manual compression. A dressing was applied. The patient tolerated the procedure well without immediate post procedural complication. IMPRESSION: Successful CT guided left iliac bone marrow aspiration and core biopsy. Electronically Signed   By: Simonne Come M.D.   On: 05/10/2023 11:18   DG Swallowing Func-Speech Pathology  Result Date: 05/09/2023 Table formatting from the original result was not included. Modified Barium Swallow Study Patient Details Name: ARSAL OHLER MRN: [id number] Date of Birth: 19-Dec-1950 Today's Date: 05/09/2023 HPI/PMH: HPI: Patient is a 72 y.o. male with PMH: GERD, HTN, HLD, thyroid disease, multiple myeloma, large hiatal hernia, dysphagia. He had an MBS at a different hospital with SLP reporting penetration with thin liquids via straw sips but not with cup sips. He presented to the hospital on 05/05/23 after being found down in his bed with gurgling respirations, emesis and was suctioned for large amount of gastric contents; he was hypoxemic and found to have aspiration PNA. Clinical Impression: Clinical Impression: Pt presents with a mild  oropharyngeal dysphagia characterized primarily by impaired timing for which he compensates for independently. Pt has trace vallecular residue with thicker consistencies, which he senses and independently clears his thoat intermittently to clear from his pharynx. With larger boluses, the head of the bolus reaches the laryngeal vestibule prior to epiglottic inversion and laryngeal elevation being initiated and results in trace, transient penetration (PAS 2) of thin liquids and nectar thick liquids via cup and straw. This is considered a normal age variant of swallowing and appears consistent with pt's previous MBS (10/20/22). This appeared to resolve with small, single cup sips. No further penetration or aspiration noted with honey thick liquids, purees, or solids. Pt was given the pill with thin liquids, which was noted to be retained in the esophagus during the flouro sweep. SLP provided subsequent bites of purees x2, which cleared the pill from the esophagus completely. Recommend pt continue diet of Dys 3 textures with thin liquids via small, controlled cup sips. No further SLP f/u is necessary at this time. Factors that  may increase risk of adverse event in presence of aspiration [name] & Clearance Coots 2021): Factors that may increase risk of adverse event in presence of aspiration [name] & Clearance Coots 2021): Reduced cognitive function Recommendations/Plan: Swallowing Evaluation Recommendations Swallowing Evaluation Recommendations Recommendations: PO diet PO Diet Recommendation: Dysphagia 3 (Mechanical soft); Thin liquids (Level 0) Liquid Administration via: Cup; No straw Medication Administration: Whole meds with puree Supervision: Staff to assist with self-feeding; Full supervision/cueing for swallowing strategies Swallowing strategies  : Minimize environmental distractions; Slow rate; Small bites/sips Postural changes: Position pt fully upright for meals Oral care recommendations: Oral care BID (2x/day) Treatment Plan  Treatment Plan Treatment recommendations: No treatment recommended at this time Follow-up recommendations: No SLP follow up Functional status assessment: Patient has not had a recent decline in their functional status. Recommendations Recommendations for follow up therapy are one component of a multi-disciplinary discharge planning process, led by the attending physician.  Recommendations may be updated based on patient status, additional functional criteria and insurance authorization. Assessment: Orofacial Exam: Orofacial Exam Oral Cavity: Oral Hygiene: WFL Oral Cavity - Dentition: Adequate natural dentition Orofacial Anatomy: WFL Oral Motor/Sensory Function: WFL Anatomy: Anatomy: Suspected cervical osteophytes Boluses Administered: Boluses Administered Boluses Administered: Thin liquids (Level 0); Mildly thick liquids (Level 2, nectar thick); Moderately thick liquids (Level 3, honey thick); Puree; Solid  Oral Impairment Domain: Oral Impairment Domain Lip Closure: No labial escape Tongue control during bolus hold: Cohesive bolus between tongue to palatal seal Bolus preparation/mastication: Timely and efficient chewing and mashing Bolus transport/lingual motion: Brisk tongue motion Oral residue: Complete oral clearance Location of oral residue : N/A Initiation of pharyngeal swallow : Valleculae  Pharyngeal Impairment Domain: Pharyngeal Impairment Domain Soft palate elevation: No bolus between soft palate (SP)/pharyngeal wall (PW) Laryngeal elevation: Complete superior movement of thyroid cartilage with complete approximation of arytenoids to epiglottic petiole Anterior hyoid excursion: Complete anterior movement Epiglottic movement: Complete inversion Laryngeal vestibule closure: Incomplete, narrow column air/contrast in laryngeal vestibule Pharyngeal stripping wave : Present - complete Pharyngeal contraction (A/P view only): N/A Pharyngoesophageal segment opening: Complete distension and complete duration, no  obstruction of flow Tongue base retraction: No contrast between tongue base and posterior pharyngeal wall (PPW) Pharyngeal residue: Trace residue within or on pharyngeal structures Location of pharyngeal residue: Valleculae  Esophageal Impairment Domain: Esophageal Impairment Domain Esophageal clearance upright position: Esophageal retention Pill: Pill Consistency administered: Thin liquids (Level 0); Puree Thin liquids (Level 0): Impaired (see clinical impressions) Puree: Impaired (see clinical impressions) Penetration/Aspiration Scale Score: Penetration/Aspiration Scale Score 1.  Material does not enter airway: Moderately thick liquids (Level 3, honey thick); Puree; Solid; Pill 2.  Material enters airway, remains ABOVE vocal cords then ejected out: Thin liquids (Level 0); Mildly thick liquids (Level 2, nectar thick) Compensatory Strategies: Compensatory Strategies Compensatory strategies: Yes Straw: Ineffective Ineffective Straw: Thin liquid (Level 0)   General Information: Caregiver present: No  Diet Prior to this Study: Dysphagia 3 (mechanical soft); Thin liquids (Level 0)   Temperature : Normal   Respiratory Status: WFL   Supplemental O2: Nasal cannula   History of Recent Intubation: No  Behavior/Cognition: Alert; Cooperative; Pleasant mood Self-Feeding Abilities: Needs assist with self-feeding Baseline vocal quality/speech: Normal Volitional Cough: Able to elicit Volitional Swallow: Able to elicit Exam Limitations: No limitations Goal Planning: Prognosis for improved oropharyngeal function: Good Barriers to Reach Goals: Cognitive deficits No data recorded Patient/Family Stated Goal: nothing specific No data recorded Pain: Pain Assessment Pain Assessment: No/denies pain End of Session: Start Time:SLP Start Time (ACUTE ONLY): 4098  Stop Time: SLP Stop Time (ACUTE ONLY): 2130 Time Calculation:SLP Time Calculation (min) (ACUTE ONLY): 23 min Charges: SLP Evaluations $ SLP Speech Visit: 1 Visit SLP Evaluations $BSS  Swallow: 1 Procedure $MBS Swallow: 1 Procedure SLP visit diagnosis: SLP Visit Diagnosis: Dysphagia, pharyngeal phase (R13.13) Past Medical History: Past Medical History: Diagnosis Date  Allergy   Anemia   Arthritis   Asthma   no treatment x 20 years  Depression   Double vision   occurs at times   Duodenal ulcer   GERD (gastroesophageal reflux disease)   Hyperlipidemia   Hypertension   Hypothyroidism   Multiple myeloma 07/04/2011  Thyroid disease  Past Surgical History: Past Surgical History: Procedure Laterality Date  BONE MARROW TRANSPLANT  2011  for MM  CARDIOLITE STUDY  11/25/2003  NORMAL  TONSILLECTOMY   Gwynneth Aliment, M.A., CF-SLP Speech Language Pathology, Acute Rehabilitation Services Secure Chat preferred [phone] 05/09/2023, 1:49 PM  DG Chest Port 1 View  Result Date: 05/06/2023 CLINICAL DATA:  Respiratory failure. EXAM: PORTABLE CHEST 1 VIEW COMPARISON:  05/05/2023 FINDINGS: Stable asymmetric elevation right hemidiaphragm. Left base collapse/consolidation noted. Tiny effusions not excluded. Cardiopericardial silhouette is at upper limits of normal for size. No acute bony abnormality. Telemetry leads overlie the chest. IMPRESSION: Bibasilar collapse/consolidation, left greater than right. Asymmetric elevation right hemidiaphragm. Electronically Signed   By: Kennith Center M.D.   On: 05/06/2023 06:29   CT CHEST ABDOMEN PELVIS WO CONTRAST  Result Date: 05/05/2023 CLINICAL DATA:  Sepsis.  Multiple myeloma.  * Tracking Code: BO * EXAM: CT CHEST, ABDOMEN AND PELVIS WITHOUT CONTRAST TECHNIQUE: Multidetector CT imaging of the chest, abdomen and pelvis was performed following the standard protocol without IV contrast. RADIATION DOSE REDUCTION: This exam was performed according to the departmental dose-optimization program which includes automated exposure control, adjustment of the mA and/or kV according to patient size and/or use of iterative reconstruction technique. COMPARISON:  Chest radiograph from  earlier today. 12/23/2015 chest CT. 11/07/2015 CT abdomen/pelvis. FINDINGS: CT CHEST FINDINGS Substantially motion degraded scan, limiting assessment. Cardiovascular: Normal heart size. No significant pericardial effusion/thickening. Three-vessel coronary atherosclerosis. Atherosclerotic nonaneurysmal thoracic aorta. Normal caliber pulmonary arteries. Mediastinum/Nodes: No significant thyroid nodules. Mildly patulous fluid-filled lower thoracic esophagus. No pathologically enlarged axillary, mediastinal or hilar lymph nodes, noting limited sensitivity for the detection of hilar adenopathy on this noncontrast study. Lungs/Pleura: No pneumothorax. No pleural effusion. Prominent eventration of the right hemidiaphragm with moderate passive right lung base atelectasis. Moderate compressive atelectasis in the medial left lower lobe from the large hiatal hernia. Otherwise no acute consolidative airspace disease, lung masses or gross pulmonary nodules on the motion degraded images. Musculoskeletal: Healed lateral right sixth through eighth rib fractures. Incompletely healed posterolateral left eighth and ninth rib fractures. Chronic patchy lucencies throughout visualized osseous structures, not appreciably changed. No new discrete lesions in the chest. Chronic mild superior T12 vertebral compression fracture, unchanged. CT ABDOMEN PELVIS FINDINGS Hepatobiliary: Mild diffuse hepatic steatosis. Normal liver size. No liver masses. Cholelithiasis. No gallbladder wall thickening or pericholecystic fluid. No biliary ductal dilatation. Pancreas: Normal, with no mass or duct dilation. Spleen: Normal size. No mass. Adrenals/Urinary Tract: Normal adrenals. No hydronephrosis. No renal stones. Simple 1.5 cm lower left renal cyst, for which no follow-up imaging is recommended. No additional contour deforming renal lesions. Normal bladder. Stomach/Bowel: Large hiatal hernia. Stomach is not significantly distended. No acute gastric  abnormality on these noncontrast motion degraded images. Small right inguinal hernia contains a small bowel loop, chronic and unchanged, with no dilated  or thick-walled small bowel loops. No focal small bowel caliber transition. No pneumatosis. Appendix not discretely visualized. Mild sigmoid diverticulosis with no large bowel wall thickening or significant pericolonic fat stranding. Vascular/Lymphatic: Atherosclerotic nonaneurysmal abdominal aorta. No pathologically enlarged lymph nodes in the abdomen or pelvis. Reproductive: Normal size prostate. Other: No pneumoperitoneum, ascites or focal fluid collection. Musculoskeletal: Mild patchy lucencies throughout the spine and pelvic girdle, chronic and not appreciably changed. No new focal osseous lesions. Moderate multilevel lumbar degenerative disc disease. IMPRESSION: 1. Substantially motion degraded scan, limiting assessment. No acute abnormality in the chest, abdomen or pelvis. 2. Chronic small right inguinal hernia containing a small bowel loop, with no evidence of acute bowel complication. No evidence of bowel obstruction or acute bowel inflammation. Mild sigmoid diverticulosis. 3. Prominent eventration of the right hemidiaphragm with moderate passive right lung base atelectasis. 4. Large hiatal hernia with moderate compressive atelectasis in the medial left lower lobe. 5. Three-vessel coronary atherosclerosis. 6. Mild diffuse hepatic steatosis. 7. Cholelithiasis. 8. Chronic patchy lucencies throughout the visualized osseous structures, compatible with reported history of multiple myeloma. No new focal osseous lesions. Chronic mild superior T12 vertebral compression fracture. 9.  Aortic Atherosclerosis (ICD10-I70.0). Electronically Signed   By: Delbert Phenix M.D.   On: 05/05/2023 19:20   DG Chest Portable 1 View  Result Date: 05/05/2023 CLINICAL DATA:  Dyspnea EXAM: PORTABLE CHEST 1 VIEW COMPARISON:  01/11/2023 chest radiograph. FINDINGS: Right rotated chest  radiograph. Stable mild cardiomegaly. Left retrocardiac lucency, suspect at least moderate hiatal hernia, unchanged. Stable mediastinal contour. No pneumothorax. No pleural effusion. Chronic prominent elevation of the right hemidiaphragm. No pulmonary edema. Mild bibasilar atelectasis. No acute consolidative airspace disease. IMPRESSION: 1. Stable mild cardiomegaly without pulmonary edema. 2. Chronic prominent elevation of the right hemidiaphragm with mild bibasilar atelectasis. 3. Stable left retrocardiac lucency, suspect at least moderate hiatal hernia. Electronically Signed   By: Delbert Phenix M.D.   On: 05/05/2023 17:43   CT Head Wo Contrast  Result Date: 04/20/2023 CLINICAL DATA:  Head and neck trauma, fall. EXAM: CT HEAD WITHOUT CONTRAST CT CERVICAL SPINE WITHOUT CONTRAST TECHNIQUE: Multidetector CT imaging of the head and cervical spine was performed following the standard protocol without intravenous contrast. Multiplanar CT image reconstructions of the cervical spine were also generated. RADIATION DOSE REDUCTION: This exam was performed according to the departmental dose-optimization program which includes automated exposure control, adjustment of the mA and/or kV according to patient size and/or use of iterative reconstruction technique. COMPARISON:  01/11/2023. FINDINGS: CT HEAD FINDINGS Brain: No acute intracranial hemorrhage, midline shift or mass effect. No extra-axial fluid collection. Periventricular white matter hypodensities are present bilaterally. No hydrocephalus. 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 Vascular: No hyperdense vessel or unexpected calcification. Skull: No acute fracture. Multiple scattered lucencies are present in the calvarium which are unchanged from the prior exam. Sinuses/Orbits: No acute finding. Other: None. CT CERVICAL SPINE FINDINGS Alignment: Minimal anterolisthesis at C4-C5. Skull base and vertebrae: No acute fracture. No primary bone lesion or  focal pathologic process. Soft tissues and spinal canal: No prevertebral fluid or swelling. No visible canal hematoma. Disc levels: Mild intervertebral disc space narrowing and uncovertebral osteophyte formation, most pronounced at C5-C7. Mild facet arthropathy bilaterally. Upper chest: No acute abnormality. Other: Carotid artery calcification. IMPRESSION: 1. Stable head CT with no acute intracranial process. 2. Multilevel degenerative changes in the cervical spine without evidence of acute fracture. Electronically Signed   By: Thornell Sartorius M.D.   On: 04/20/2023 00:36  CT Cervical Spine Wo Contrast  Result Date: 04/20/2023 CLINICAL DATA:  Head and neck trauma, fall. EXAM: CT HEAD WITHOUT CONTRAST CT CERVICAL SPINE WITHOUT CONTRAST TECHNIQUE: Multidetector CT imaging of the head and cervical spine was performed following the standard protocol without intravenous contrast. Multiplanar CT image reconstructions of the cervical spine were also generated. RADIATION DOSE REDUCTION: This exam was performed according to the departmental dose-optimization program which includes automated exposure control, adjustment of the mA and/or kV according to patient size and/or use of iterative reconstruction technique. COMPARISON:  01/11/2023. FINDINGS: CT HEAD FINDINGS Brain: No acute intracranial hemorrhage, midline shift or mass effect. No extra-axial fluid collection. Periventricular white matter hypodensities are present bilaterally. No hydrocephalus. 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 Vascular: No hyperdense vessel or unexpected calcification. Skull: No acute fracture. Multiple scattered lucencies are present in the calvarium which are unchanged from the prior exam. Sinuses/Orbits: No acute finding. Other: None. CT CERVICAL SPINE FINDINGS Alignment: Minimal anterolisthesis at C4-C5. Skull base and vertebrae: No acute fracture. No primary bone lesion or focal pathologic process. Soft tissues and  spinal canal: No prevertebral fluid or swelling. No visible canal hematoma. Disc levels: Mild intervertebral disc space narrowing and uncovertebral osteophyte formation, most pronounced at C5-C7. Mild facet arthropathy bilaterally. Upper chest: No acute abnormality. Other: Carotid artery calcification. IMPRESSION: 1. Stable head CT with no acute intracranial process. 2. Multilevel degenerative changes in the cervical spine without evidence of acute fracture. Electronically Signed   By: Thornell Sartorius M.D.   On: 04/20/2023 00:36     Discharge Exam: Vitals:   05/10/23 1934 05/11/23 0455  BP: (!) 132/58 119/63  Pulse: 76 61  Resp: 16 15  Temp: 98.2 F (36.8 C) 97.6 F (36.4 C)  SpO2: 90% 92%   Vitals:   05/10/23 1005 05/10/23 1746 05/10/23 1934 05/11/23 0455  BP: 123/78 129/61 (!) 132/58 119/63  Pulse: 75 84 76 61  Resp: 20 18 16 15   Temp:  98 F (36.7 C) 98.2 F (36.8 C) 97.6 F (36.4 C)  TempSrc:  Oral Oral Oral  SpO2: 97% 94% 90% 92%  Weight:    78.8 kg    General: Pt is alert, awake, not in acute distress Cardiovascular: RRR, S1/S2 +, no rubs, no gallops Respiratory: CTA bilaterally, no wheezing, no rhonchi Abdominal: Soft, NT, ND, bowel sounds + Extremities: no edema, no cyanosis    The results of significant diagnostics from this hospitalization (including imaging, microbiology, ancillary and laboratory) are listed below for reference.     Microbiology: Recent Results (from the past 240 hour(s))  Blood culture (routine x 2)     Status: Abnormal   Collection Time: 05/05/23  7:01 PM   Specimen: BLOOD RIGHT HAND  Result Value Ref Range Status   Specimen Description BLOOD RIGHT HAND  Final   Special Requests   Final    BOTTLES DRAWN AEROBIC AND ANAEROBIC Blood Culture results may not be optimal due to an inadequate volume of blood received in culture bottles   Culture  Setup Time   Final    GRAM NEGATIVE RODS AEROBIC BOTTLE ONLY CRITICAL RESULT CALLED TO, READ BACK BY  AND VERIFIED WITH: PHARMD T DANG [id number] AT 1420 BY EC Performed at [facility], [address] [place], [place] [address]    Culture PSEUDOMONAS AERUGINOSA (A)  Final   Report Status 05/08/2023 FINAL  Final   Organism ID, Bacteria PSEUDOMONAS AERUGINOSA  Final  Susceptibility   Pseudomonas aeruginosa - MIC*    CEFTAZIDIME 4 SENSITIVE Sensitive     CIPROFLOXACIN <=0.25 SENSITIVE Sensitive     GENTAMICIN 2 SENSITIVE Sensitive     IMIPENEM 1 SENSITIVE Sensitive     PIP/TAZO 8 SENSITIVE Sensitive     CEFEPIME 2 SENSITIVE Sensitive     * PSEUDOMONAS AERUGINOSA  Blood Culture ID Panel (Reflexed)     Status: Abnormal   Collection Time: 05/05/23  7:01 PM  Result Value Ref Range Status   Enterococcus faecalis NOT DETECTED NOT DETECTED Final   Enterococcus Faecium NOT DETECTED NOT DETECTED Final   Listeria monocytogenes NOT DETECTED NOT DETECTED Final   Staphylococcus species NOT DETECTED NOT DETECTED Final   Staphylococcus aureus (BCID) NOT DETECTED NOT DETECTED Final   Staphylococcus epidermidis NOT DETECTED NOT DETECTED Final   Staphylococcus lugdunensis NOT DETECTED NOT DETECTED Final   Streptococcus species NOT DETECTED NOT DETECTED Final   Streptococcus agalactiae NOT DETECTED NOT DETECTED Final   Streptococcus pneumoniae NOT DETECTED NOT DETECTED Final   Streptococcus pyogenes NOT DETECTED NOT DETECTED Final   A.calcoaceticus-baumannii NOT DETECTED NOT DETECTED Final   Bacteroides fragilis NOT DETECTED NOT DETECTED Final   Enterobacterales NOT DETECTED NOT DETECTED Final   Enterobacter cloacae complex NOT DETECTED NOT DETECTED Final   Escherichia coli NOT DETECTED NOT DETECTED Final   Klebsiella aerogenes NOT DETECTED NOT DETECTED Final   Klebsiella oxytoca NOT DETECTED NOT DETECTED Final   Klebsiella pneumoniae NOT DETECTED NOT DETECTED Final   Proteus species NOT DETECTED NOT DETECTED Final   Salmonella species NOT DETECTED NOT DETECTED Final   Serratia marcescens  NOT DETECTED NOT DETECTED Final   Haemophilus influenzae NOT DETECTED NOT DETECTED Final   Neisseria meningitidis NOT DETECTED NOT DETECTED Final   Pseudomonas aeruginosa DETECTED (A) NOT DETECTED Final    Comment: CRITICAL RESULT CALLED TO, READ BACK BY AND VERIFIED WITH: PHARMD T DANG [id number] AT 1420 BY EC    Stenotrophomonas maltophilia NOT DETECTED NOT DETECTED Final   Candida albicans NOT DETECTED NOT DETECTED Final   Candida auris NOT DETECTED NOT DETECTED Final   Candida glabrata NOT DETECTED NOT DETECTED Final   Candida krusei NOT DETECTED NOT DETECTED Final   Candida parapsilosis NOT DETECTED NOT DETECTED Final   Candida tropicalis NOT DETECTED NOT DETECTED Final   Cryptococcus neoformans/gattii NOT DETECTED NOT DETECTED Final   CTX-M ESBL NOT DETECTED NOT DETECTED Final   Carbapenem resistance IMP NOT DETECTED NOT DETECTED Final   Carbapenem resistance KPC NOT DETECTED NOT DETECTED Final   Carbapenem resistance NDM NOT DETECTED NOT DETECTED Final   Carbapenem resistance VIM NOT DETECTED NOT DETECTED Final    Comment: Performed at [facility], [address], [place], [place] [address]  MRSA Next Gen by PCR, Nasal     Status: None   Collection Time: 05/06/23 12:41 AM   Specimen: Nasal Mucosa; Nasal Swab  Result Value Ref Range Status   MRSA by PCR Next Gen NOT DETECTED NOT DETECTED Final    Comment: (NOTE) The GeneXpert MRSA Assay (FDA approved for NASAL specimens only), is one component of a comprehensive MRSA colonization surveillance program. It is not intended to diagnose MRSA infection nor to guide or monitor treatment for MRSA infections. Test performance is not FDA approved in patients less than 61 years old. Performed at [facility], [address] [place], [place] [address]   Blood culture (routine x 2)     Status: None  Collection Time: 05/06/23  1:55 AM   Specimen: BLOOD LEFT HAND  Result Value Ref Range Status   Specimen Description BLOOD  LEFT HAND  Final   Special Requests   Final    BOTTLES DRAWN AEROBIC AND ANAEROBIC Blood Culture adequate volume   Culture   Final    NO GROWTH 5 DAYS Performed at [facility], [address] [place], [place] [address]    Report Status 05/11/2023 FINAL  Final  Culture, blood (Routine X 2) w Reflex to ID Panel     Status: None (Preliminary result)   Collection Time: 05/08/23  8:21 AM   Specimen: BLOOD LEFT FOREARM  Result Value Ref Range Status   Specimen Description BLOOD LEFT FOREARM  Final   Special Requests   Final    BOTTLES DRAWN AEROBIC AND ANAEROBIC Blood Culture adequate volume   Culture   Final    NO GROWTH 3 DAYS Performed at [facility], [address] [place], [place] [address]    Report Status PENDING  Incomplete  Culture, blood (Routine X 2) w Reflex to ID Panel     Status: None (Preliminary result)   Collection Time: 05/08/23  8:21 AM   Specimen: BLOOD RIGHT HAND  Result Value Ref Range Status   Specimen Description BLOOD RIGHT HAND  Final   Special Requests   Final    BOTTLES DRAWN AEROBIC AND ANAEROBIC Blood Culture adequate volume   Culture   Final    NO GROWTH 3 DAYS Performed at [facility], [address], [place], [place] [address]    Report Status PENDING  Incomplete     Labs: BNP (last 3 results) Recent Labs    05/05/23 1744  BNP 116.7*   Basic Metabolic Panel: Recent Labs  Lab 05/05/23 1901 05/06/23 0155 05/07/23 0720 05/08/23 0107 05/09/23 2357  NA 138  --  137 135 136  K 4.5  --  4.2 3.6 4.2  CL 101  --  104 105 102  CO2 24  --  24 24 26   GLUCOSE 106*  --  98 94 95  BUN 21  --  15 16 19   CREATININE 1.08 1.19 0.87 1.04 0.91  CALCIUM 9.4  --  9.0 8.8* 9.1   Liver Function Tests: Recent Labs  Lab 05/05/23 1901  AST 35  ALT 29  ALKPHOS 87  BILITOT 0.5  PROT 6.3*  ALBUMIN 3.7   Recent Labs  Lab 05/05/23 1901  LIPASE 30   No results for input(s): "AMMONIA" in the last 168 hours. CBC: Recent Labs   Lab 05/05/23 1744 05/06/23 0155 05/08/23 1430 05/09/23 0252 05/09/23 2357 05/10/23 1044 05/11/23 0348  WBC 5.3   < > 2.3* 2.3* 2.4* 2.6* 2.2*  NEUTROABS 4.4  --  1.8  CANNOT DO DIFF WITHOUT CBC RESULTS  --  1.7 1.8 1.3*  HGB 9.5*   < > 9.1* 8.9* 9.2* 10.1* 9.7*  HCT 29.2*   < > 27.9* 27.0* 27.7* 30.9* 29.9*  MCV 120.7*   < > 110.7* 110.2* 111.2* 109.6* 107.9*  PLT 37*   < > 25* 30* 27* 30* 27*   < > = values in this interval not displayed.   Cardiac Enzymes: No results for input(s): "CKTOTAL", "CKMB", "CKMBINDEX", "TROPONINI" in the last 168 hours. BNP: Invalid input(s): "POCBNP" CBG: Recent Labs  Lab 05/08/23 1626 05/08/23 2203 05/08/23 2341 05/09/23 0349 05/09/23 0738  GLUCAP 117* 96 99 106* 88  D-Dimer No results for input(s): "DDIMER" in the last 72 hours. Hgb A1c No results for input(s): "HGBA1C" in the last 72 hours. Lipid Profile No results for input(s): "CHOL", "HDL", "LDLCALC", "TRIG", "CHOLHDL", "LDLDIRECT" in the last 72 hours. Thyroid function studies No results for input(s): "TSH", "T4TOTAL", "T3FREE", "THYROIDAB" in the last 72 hours.  Invalid input(s): "FREET3" Anemia work up No results for input(s): "VITAMINB12", "FOLATE", "FERRITIN", "TIBC", "IRON", "RETICCTPCT" in the last 72 hours. Urinalysis    Component Value Date/Time   COLORURINE YELLOW 04/18/2023 0927   APPEARANCEUR HAZY (A) 04/18/2023 0927   APPEARANCEUR Clear 12/22/2015 1614   LABSPEC 1.021 04/18/2023 0927   PHURINE 5.5 04/18/2023 0927   GLUCOSEU NEGATIVE 04/18/2023 0927   GLUCOSEU NEGATIVE 09/06/2017 1040   HGBUR NEGATIVE 04/18/2023 0927   BILIRUBINUR NEGATIVE 04/18/2023 0927   BILIRUBINUR Negative 12/22/2015 1614   KETONESUR NEGATIVE 04/18/2023 0927   PROTEINUR NEGATIVE 04/18/2023 0927   UROBILINOGEN 0.2 09/06/2017 1040   NITRITE NEGATIVE 04/18/2023 0927   LEUKOCYTESUR NEGATIVE 04/18/2023 0927   Sepsis Labs Recent Labs  Lab 05/09/23 0252 05/09/23 2357 05/10/23 1044  05/11/23 0348  WBC 2.3* 2.4* 2.6* 2.2*   Microbiology Recent Results (from the past 240 hour(s))  Blood culture (routine x 2)     Status: Abnormal   Collection Time: 05/05/23  7:01 PM   Specimen: BLOOD RIGHT HAND  Result Value Ref Range Status   Specimen Description BLOOD RIGHT HAND  Final   Special Requests   Final    BOTTLES DRAWN AEROBIC AND ANAEROBIC Blood Culture results may not be optimal due to an inadequate volume of blood received in culture bottles   Culture  Setup Time   Final    GRAM NEGATIVE RODS AEROBIC BOTTLE ONLY CRITICAL RESULT CALLED TO, READ BACK BY AND VERIFIED WITH: Sherrin Daisy [id number] AT 1420 BY EC Performed at [facility], [address] [place], [place] [address]    Culture PSEUDOMONAS AERUGINOSA (A)  Final   Report Status 05/08/2023 FINAL  Final   Organism ID, Bacteria PSEUDOMONAS AERUGINOSA  Final      Susceptibility   Pseudomonas aeruginosa - MIC*    CEFTAZIDIME 4 SENSITIVE Sensitive     CIPROFLOXACIN <=0.25 SENSITIVE Sensitive     GENTAMICIN 2 SENSITIVE Sensitive     IMIPENEM 1 SENSITIVE Sensitive     PIP/TAZO 8 SENSITIVE Sensitive     CEFEPIME 2 SENSITIVE Sensitive     * PSEUDOMONAS AERUGINOSA  Blood Culture ID Panel (Reflexed)     Status: Abnormal   Collection Time: 05/05/23  7:01 PM  Result Value Ref Range Status   Enterococcus faecalis NOT DETECTED NOT DETECTED Final   Enterococcus Faecium NOT DETECTED NOT DETECTED Final   Listeria monocytogenes NOT DETECTED NOT DETECTED Final   Staphylococcus species NOT DETECTED NOT DETECTED Final   Staphylococcus aureus (BCID) NOT DETECTED NOT DETECTED Final   Staphylococcus epidermidis NOT DETECTED NOT DETECTED Final   Staphylococcus lugdunensis NOT DETECTED NOT DETECTED Final   Streptococcus species NOT DETECTED NOT DETECTED Final   Streptococcus agalactiae NOT DETECTED NOT DETECTED Final   Streptococcus pneumoniae NOT DETECTED NOT DETECTED Final   Streptococcus pyogenes NOT DETECTED NOT  DETECTED Final   A.calcoaceticus-baumannii NOT DETECTED NOT DETECTED Final   Bacteroides fragilis NOT DETECTED NOT DETECTED Final   Enterobacterales NOT DETECTED NOT DETECTED Final   Enterobacter cloacae complex NOT DETECTED NOT DETECTED Final   Escherichia coli NOT DETECTED NOT DETECTED Final   Klebsiella aerogenes NOT  DETECTED NOT DETECTED Final   Klebsiella oxytoca NOT DETECTED NOT DETECTED Final   Klebsiella pneumoniae NOT DETECTED NOT DETECTED Final   Proteus species NOT DETECTED NOT DETECTED Final   Salmonella species NOT DETECTED NOT DETECTED Final   Serratia marcescens NOT DETECTED NOT DETECTED Final   Haemophilus influenzae NOT DETECTED NOT DETECTED Final   Neisseria meningitidis NOT DETECTED NOT DETECTED Final   Pseudomonas aeruginosa DETECTED (A) NOT DETECTED Final    Comment: CRITICAL RESULT CALLED TO, READ BACK BY AND VERIFIED WITH: PHARMD T DANG [id number] AT 1420 BY EC    Stenotrophomonas maltophilia NOT DETECTED NOT DETECTED Final   Candida albicans NOT DETECTED NOT DETECTED Final   Candida auris NOT DETECTED NOT DETECTED Final   Candida glabrata NOT DETECTED NOT DETECTED Final   Candida krusei NOT DETECTED NOT DETECTED Final   Candida parapsilosis NOT DETECTED NOT DETECTED Final   Candida tropicalis NOT DETECTED NOT DETECTED Final   Cryptococcus neoformans/gattii NOT DETECTED NOT DETECTED Final   CTX-M ESBL NOT DETECTED NOT DETECTED Final   Carbapenem resistance IMP NOT DETECTED NOT DETECTED Final   Carbapenem resistance KPC NOT DETECTED NOT DETECTED Final   Carbapenem resistance NDM NOT DETECTED NOT DETECTED Final   Carbapenem resistance VIM NOT DETECTED NOT DETECTED Final    Comment: Performed at [facility], [address], [place], [place] [address]  MRSA Next Gen by PCR, Nasal     Status: None   Collection Time: 05/06/23 12:41 AM   Specimen: Nasal Mucosa; Nasal Swab  Result Value Ref Range Status   MRSA by PCR Next Gen NOT DETECTED NOT DETECTED Final     Comment: (NOTE) The GeneXpert MRSA Assay (FDA approved for NASAL specimens only), is one component of a comprehensive MRSA colonization surveillance program. It is not intended to diagnose MRSA infection nor to guide or monitor treatment for MRSA infections. Test performance is not FDA approved in patients less than 5 years old. Performed at [facility], [address] [place], [place] [address]   Blood culture (routine x 2)     Status: None   Collection Time: 05/06/23  1:55 AM   Specimen: BLOOD LEFT HAND  Result Value Ref Range Status   Specimen Description BLOOD LEFT HAND  Final   Special Requests   Final    BOTTLES DRAWN AEROBIC AND ANAEROBIC Blood Culture adequate volume   Culture   Final    NO GROWTH 5 DAYS Performed at [facility], [address] [place], [place] [address]    Report Status 05/11/2023 FINAL  Final  Culture, blood (Routine X 2) w Reflex to ID Panel     Status: None (Preliminary result)   Collection Time: 05/08/23  8:21 AM   Specimen: BLOOD LEFT FOREARM  Result Value Ref Range Status   Specimen Description BLOOD LEFT FOREARM  Final   Special Requests   Final    BOTTLES DRAWN AEROBIC AND ANAEROBIC Blood Culture adequate volume   Culture   Final    NO GROWTH 3 DAYS Performed at [facility], [address] [place], [place] [address]    Report Status PENDING  Incomplete  Culture, blood (Routine X 2) w Reflex to ID Panel     Status: None (Preliminary result)   Collection Time: 05/08/23  8:21 AM   Specimen: BLOOD RIGHT HAND  Result Value Ref Range Status   Specimen Description BLOOD RIGHT HAND  Final   Special Requests  Final    BOTTLES DRAWN AEROBIC AND ANAEROBIC Blood Culture adequate volume   Culture   Final    NO GROWTH 3 DAYS Performed at [facility], [address], [place], [place] [address]    Report Status PENDING  Incomplete    FURTHER DISCHARGE INSTRUCTIONS:   Get Medicines reviewed and adjusted: Please take  all your medications with you for your next visit with your Primary MD   Laboratory/radiological data: Please request your Primary MD to go over all hospital tests and procedure/radiological results at the follow up, please ask your Primary MD to get all Hospital records sent to his/her office.   In some cases, they will be blood work, cultures and biopsy results pending at the time of your discharge. Please request that your primary care M.D. goes through all the records of your hospital data and follows up on these results.   Also Note the following: If you experience worsening of your admission symptoms, develop shortness of breath, life threatening emergency, suicidal or homicidal thoughts you must seek medical attention immediately by calling 911 or calling your MD immediately  if symptoms less severe.   You must read complete instructions/literature along with all the possible adverse reactions/side effects for all the Medicines you take and that have been prescribed to you. Take any new Medicines after you have completely understood and accpet all the possible adverse reactions/side effects.    Do not drive when taking Pain medications or sleeping medications (Benzodaizepines)   Do not take more than prescribed Pain, Sleep and Anxiety Medications. It is not advisable to combine anxiety,sleep and pain medications without talking with your primary care practitioner   Special Instructions: If you have smoked or chewed Tobacco  in the last 2 yrs please stop smoking, stop any regular Alcohol  and or any Recreational drug use.   Wear Seat belts while driving.   Please note: You were cared for by a hospitalist during your hospital stay. Once you are discharged, your primary care physician will handle any further medical issues. Please note that NO REFILLS for any discharge medications will be authorized once you are discharged, as it is imperative that you return to your primary care physician  (or establish a relationship with a primary care physician if you do not have one) for your post hospital discharge needs so that they can reassess your need for medications and monitor your lab values  Time coordinating discharge: Over 30 minutes  SIGNED:   Hughie Closs, MD  [facility] 05/11/2023, 9:26 AM *Please note that this is a verbal dictation therefore any spelling or grammatical errors are due to the "Dragon [employer] One" system interpretation. If 7PM-7AM, please contact night-coverage [email or web address]

## 2023-05-11 NOTE — TOC Transition Note (Signed)
Transition of Care [facility]) - CM/SW Discharge Note   Patient Details  Name: Andre [name] MRN: [id number] Date of Birth: [birth year]  Transition of Care [facility] CM/SW Contact:  Delilah Shan, LCSWA Phone Number: 05/11/2023, 11:24 AM   Clinical Narrative:     Patient will DC to: [facility]   Anticipated DC date: 05/11/2023  Family notified: [name]  Transport by: Sharin Mons  ?  Per MD patient ready for DC to [facility] . RN, patient, patient's family, and facility notified of DC. Discharge Summary sent to facility. RN given number for report tele# [phone] RM# [phone]. DC packet on chart.DNR signed by MD attached to patients DC packet. Ambulance transport requested for patient.  CSW signing off.   Final next level of care: Skilled Nursing Facility Barriers to Discharge: No Barriers Identified   Patient Goals and CMS Choice CMS Medicare.gov Compare Post Acute Care list provided to:: Patient Represenative (must comment) (patients spouse) Choice offered to / list presented to : Spouse  Discharge Placement                Patient chooses bed at: [facility] Patient to be transferred to facility by: [facility] Name of family member notified: [name] Patient and family notified of of transfer: 05/11/23  Discharge Plan and Services Additional resources added to the After Visit Summary for   In-house Referral: Clinical Social Work                                   Social Determinants of Health (SDOH) Interventions SDOH Screenings   Food Insecurity: Low Risk  (10/13/2022)   Received from [facility]  Transportation Needs: No Transportation Needs (11/17/2022)  Utilities: Low Risk  (10/13/2022)   [facility]  Depression (PHQ2-9): Low Risk  (06/08/2022)  Tobacco Use: Medium Risk (05/02/2023)     Readmission Risk Interventions     No data to display

## 2023-05-13 LAB — CULTURE, BLOOD (ROUTINE X 2)
Culture: NO GROWTH
Culture: NO GROWTH
Special Requests: ADEQUATE
Special Requests: ADEQUATE

## 2023-05-14 ENCOUNTER — Encounter: Payer: Medicare Other | Admitting: Adult Health

## 2023-05-14 ENCOUNTER — Ambulatory Visit (HOSPITAL_COMMUNITY): Payer: Medicare Other

## 2023-05-14 ENCOUNTER — Encounter: Payer: Self-pay | Admitting: Internal Medicine

## 2023-05-14 ENCOUNTER — Non-Acute Institutional Stay (SKILLED_NURSING_FACILITY): Payer: Medicare Other | Admitting: Internal Medicine

## 2023-05-14 DIAGNOSIS — R296 Repeated falls: Secondary | ICD-10-CM

## 2023-05-14 DIAGNOSIS — E039 Hypothyroidism, unspecified: Secondary | ICD-10-CM | POA: Diagnosis not present

## 2023-05-14 DIAGNOSIS — R7881 Bacteremia: Secondary | ICD-10-CM | POA: Diagnosis not present

## 2023-05-14 DIAGNOSIS — C9000 Multiple myeloma not having achieved remission: Secondary | ICD-10-CM

## 2023-05-14 DIAGNOSIS — R413 Other amnesia: Secondary | ICD-10-CM

## 2023-05-14 DIAGNOSIS — D500 Iron deficiency anemia secondary to blood loss (chronic): Secondary | ICD-10-CM

## 2023-05-14 DIAGNOSIS — B965 Pseudomonas (aeruginosa) (mallei) (pseudomallei) as the cause of diseases classified elsewhere: Secondary | ICD-10-CM

## 2023-05-14 DIAGNOSIS — G620 Drug-induced polyneuropathy: Secondary | ICD-10-CM

## 2023-05-14 DIAGNOSIS — F329 Major depressive disorder, single episode, unspecified: Secondary | ICD-10-CM

## 2023-05-14 DIAGNOSIS — T451X5A Adverse effect of antineoplastic and immunosuppressive drugs, initial encounter: Secondary | ICD-10-CM

## 2023-05-14 DIAGNOSIS — D61818 Other pancytopenia: Secondary | ICD-10-CM | POA: Diagnosis not present

## 2023-05-14 NOTE — Progress Notes (Signed)
Provider:   Location:  [facility] Room Number: 153A Place of Service:  SNF (31)  PCP: [name], MD Patient Care Team: [name], MD as PCP - General (Internal Medicine) Ronalee Red, Wynelle Cleveland, MD as Consulting Physician (Neurology) Eddie Candle, MD as Consulting Physician (Internal Medicine) York Spaniel, MD (Inactive) as Consulting Physician (Neurology) Sharlett Iles, DDS as Referring Physician (Oral Surgery) Ladene Artist, MD as Consulting Physician (Oncology) Fletcher Anon, NP as Nurse Practitioner (Nurse Practitioner)  Extended Emergency Contact Information Primary Emergency Contact: Hunger,[name] Address: [address]          [place], [place] [address] [name] of [place] Home Phone: [phone] Relation: Spouse  Code Status: DNR Goals of Care: Advanced Directive information    04/20/2023    8:21 AM  Advanced Directives  Does Patient Have a Medical Advance Directive? Yes  Type of Advance Directive Out of facility DNR (pink MOST or yellow form)  Does patient want to make changes to medical advance directive? No - Patient declined  Would patient like information on creating a medical advance directive? No - Patient declined  Pre-existing out of facility DNR order (yellow form or pink MOST form) Yellow form placed in chart (order not valid for inpatient use)      Chief Complaint  Patient presents with   New Admit To SNF    Patient is being seen for readmission     HPI: Patient is a 72 y.o. male seen today for admission to Rehab  Admitted in Rehab in [place]  Admitted from 08/17-08/23 for Acute Hypoxic Respiratory failure and Pseudomonas Bacteremia  Patient has h/o Multiple Myeloma diagnosed in 2012 Recently Post CAR T therapy per DR Marissa Calamity on 01/24  In Clinical Remission On IVIG  MCI due to CART and Chronic Microvascular changes On Ritalin Recurent falls due to Unstable Gait Anemia  with Occult positive stools Pancytopenia due to above  He was recently transferred to [facility] from SNF He fell in the bathroom and then was found to be shaking lethargic and episode of vomiting followed By hypoxia Send to ED Blood cultures positive for Pseudomonas ID thinks due to Aspiration Pneumonia  Treated with Cefepime and now discharged on Cipro Patient also underwent Bone Marrow Biopsy  Results pending  He is now back on Rehab  Had no new issues Walking with assist per staff Wants to  go back to AL No Cough or sob No Chest pain or Fever or chills    Past Medical History:  Diagnosis Date   Allergy    Anemia    Arthritis    Asthma    no treatment x 20 years   Depression    Double vision    occurs at times    Duodenal ulcer    GERD (gastroesophageal reflux disease)    Hyperlipidemia    Hypertension    Hypothyroidism    Multiple myeloma 07/04/2011   Thyroid disease    Past Surgical History:  Procedure Laterality Date   BONE MARROW TRANSPLANT  2011   for MM   CARDIOLITE STUDY  11/25/2003   NORMAL   TONSILLECTOMY      reports that he quit smoking about 54 years ago. His smoking use included cigarettes. He started smoking about 57 years ago. He has never used smokeless tobacco. He reports that he does not drink alcohol and does not use drugs. Social History   Socioeconomic History   Marital status: Married  Spouse name: Not on file   Number of children: 2   Years of education: 5   Highest education level: Not on file  Occupational History   Not on file  Tobacco Use   Smoking status: Former    Current packs/day: 0.00    Types: Cigarettes    Start date: 03/27/1966    Quit date: 03/27/1969    Years since quitting: 54.1   Smokeless tobacco: Never  Substance and Sexual Activity   Alcohol use: No   Drug use: No   Sexual activity: Not Currently  Other Topics Concern   Not on file  Social History Narrative   Right handed   [employer] fan   Drinks caffeine    [employer]   [employer] 8 years    Social Determinants of Health   Financial Resource Strain: Not on file  Food Insecurity: Low Risk  (10/13/2022)   [facility]   Food vital sign    Within the past 12 months, you worried that your food would run out before you got money to buy more: Never true    Within the past 12 months, the food you bought just didn't last and you didn't have money to get more. : Never true  Transportation Needs: No Transportation Needs (11/17/2022)   PRAPARE - Administrator, Civil Service (Medical): No    Lack of Transportation (Non-Medical): No  Physical Activity: Not on file  Stress: Not on file  Social Connections: Not on file  Intimate Partner Violence: Not on file    Functional Status Survey:    Family History  Problem Relation Age of Onset   Throat cancer Mother    Hypertension Father    Stroke Father    Asthma Father    Diabetes Father    Pancreatic cancer Brother     Health Maintenance  Topic Date Due   Medicare Annual Wellness (AWV)  Never done   Pneumonia Vaccine 72+ Years old (3 of 3 - PPSV23 or PCV20) 06/16/2018   COVID-19 Vaccine (6 - 2023-24 season) 08/30/2022   INFLUENZA VACCINE  04/19/2023   Zoster Vaccines- Shingrix (1 of 2) 02/18/2024 (Originally 06/06/1970)   Colonoscopy  09/24/2023   DTaP/Tdap/Td (5 - Tdap) 03/06/2028   HPV VACCINES  Aged Out   Hepatitis C Screening  Discontinued    Allergies  Allergen Reactions   Atorvastatin Other (See Comments)   Rosuvastatin     Other reaction(s): Liver Disorder   Crestor [Rosuvastatin Calcium]     ADVERSE EFFECTS ON LIVER   Lipitor [Atorvastatin Calcium]    Septra [Sulfamethoxazole-Trimethoprim] Rash    Outpatient Encounter Medications as of 05/14/2023  Medication Sig   acetaminophen (TYLENOL) 650 MG CR tablet Take 650 mg by mouth every 6 (six) hours as needed for pain.   calcium carbonate (TUMS EX) 750 MG chewable tablet Chew 2 tablets by  mouth 3 (three) times daily as needed for heartburn.   calcium-vitamin D (OSCAL WITH D) 500-200 MG-UNIT tablet Take 1 tablet by mouth daily.   Cholecalciferol (VITAMIN D3) 25 MCG (1000 UT) capsule Take 1,000 Units by mouth daily.   cyanocobalamin (VITAMIN B12) 1000 MCG tablet Take 1 tablet (1,000 mcg total) by mouth daily.   dapsone 100 MG tablet Take 100 mg by mouth daily.   docusate sodium (COLACE) 100 MG capsule Take 100 mg by mouth daily as needed for mild constipation or moderate constipation.   escitalopram (LEXAPRO) 10 MG tablet Take  1 tablet (10 mg total) by mouth daily.   ferrous sulfate 325 (65 FE) MG EC tablet Take 325 mg by mouth 2 (two) times daily.   folic acid (FOLVITE) 1 MG tablet Take 1 tablet (1 mg total) by mouth daily.   levothyroxine (SYNTHROID) 125 MCG tablet Take 125 mcg by mouth daily.   methylphenidate (RITALIN) 5 MG tablet Take 0.5 tablets (2.5 mg total) by mouth daily. Each morning   pantoprazole (PROTONIX) 40 MG tablet Take 1 tablet (40 mg total) by mouth daily.   valACYclovir (VALTREX) 500 MG tablet Take 500 mg by mouth daily.   [DISCONTINUED] prochlorperazine (COMPAZINE) 10 MG tablet Take 1 tablet (10 mg total) by mouth every 6 (six) hours as needed (Nausea or vomiting).   No facility-administered encounter medications on file as of 05/14/2023.    Review of Systems  Constitutional:  Positive for activity change. Negative for appetite change and unexpected weight change.  HENT: Negative.    Respiratory:  Negative for cough and shortness of breath.   Cardiovascular:  Negative for leg swelling.  Gastrointestinal:  Negative for constipation.  Genitourinary:  Negative for frequency.  Musculoskeletal:  Positive for gait problem. Negative for arthralgias and myalgias.  Skin: Negative.  Negative for rash.  Neurological:  Negative for dizziness and weakness.  Psychiatric/Behavioral:  Positive for confusion. Negative for sleep disturbance.   All other systems reviewed  and are negative.   Vitals:   05/14/23 0953  BP: 129/72  Pulse: 70  Resp: 14  Temp: 98 F (36.7 C)  TempSrc: Temporal  SpO2: 92%  Weight: 172 lb (78 kg)  Height: 5\' 7"  (1.702 m)   Body mass index is 26.94 kg/m. Physical Exam Vitals reviewed.  Constitutional:      Appearance: Normal appearance.  HENT:     Head: Normocephalic.     Nose: Nose normal.     Mouth/Throat:     Mouth: Mucous membranes are moist.     Pharynx: Oropharynx is clear.  Eyes:     Pupils: Pupils are equal, round, and reactive to light.  Cardiovascular:     Rate and Rhythm: Normal rate and regular rhythm.     Pulses: Normal pulses.     Heart sounds: No murmur heard. Pulmonary:     Effort: Pulmonary effort is normal. No respiratory distress.     Breath sounds: Normal breath sounds. No rales.  Abdominal:     General: Abdomen is flat. Bowel sounds are normal.     Palpations: Abdomen is soft.  Musculoskeletal:        General: No swelling.     Cervical back: Neck supple.  Skin:    General: Skin is warm.  Neurological:     General: No focal deficit present.     Mental Status: He is alert.  Psychiatric:        Mood and Affect: Mood normal.        Thought Content: Thought content normal.     Labs reviewed: Basic Metabolic Panel: Recent Labs    05/07/23 0720 05/08/23 0107 05/09/23 2357  NA 137 135 136  K 4.2 3.6 4.2  CL 104 105 102  CO2 24 24 26   GLUCOSE 98 94 95  BUN 15 16 19   CREATININE 0.87 1.04 0.91  CALCIUM 9.0 8.8* 9.1   Liver Function Tests: Recent Labs    03/14/23 0755 04/09/23 1259 05/05/23 1901  AST 25 23 35  ALT 21 19 29   ALKPHOS 88 82 87  BILITOT 0.5 0.5 0.5  PROT 7.0 6.5 6.3*  ALBUMIN 4.3 4.1 3.7   Recent Labs    10/29/22 2042 05/05/23 1901  LIPASE 57* 30   No results for input(s): "AMMONIA" in the last 8760 hours. CBC: Recent Labs    05/09/23 2357 05/10/23 1044 05/11/23 0348  WBC 2.4* 2.6* 2.2*  NEUTROABS 1.7 1.8 1.3*  HGB 9.2* 10.1* 9.7*  HCT 27.7*  30.9* 29.9*  MCV 111.2* 109.6* 107.9*  PLT 27* 30* 27*   Cardiac Enzymes: No results for input(s): "CKTOTAL", "CKMB", "CKMBINDEX", "TROPONINI" in the last 8760 hours. BNP: Invalid input(s): "POCBNP" Lab Results  Component Value Date   HGBA1C 4.3 (L) 05/06/2023   Lab Results  Component Value Date   TSH 0.02 (L) 08/04/2021   Lab Results  Component Value Date   VITAMINB12 344 08/16/2016   Lab Results  Component Value Date   FOLATE 15.7 09/22/2010   Lab Results  Component Value Date   IRON 110 04/10/2023   TIBC 589 (H) 04/10/2023   FERRITIN 18 (L) 04/10/2023    Imaging and Procedures obtained prior to SNF admission: DG Chest Port 1 View  Result Date: 05/06/2023 CLINICAL DATA:  Respiratory failure. EXAM: PORTABLE CHEST 1 VIEW COMPARISON:  05/05/2023 FINDINGS: Stable asymmetric elevation right hemidiaphragm. Left base collapse/consolidation noted. Tiny effusions not excluded. Cardiopericardial silhouette is at upper limits of normal for size. No acute bony abnormality. Telemetry leads overlie the chest. IMPRESSION: Bibasilar collapse/consolidation, left greater than right. Asymmetric elevation right hemidiaphragm. Electronically Signed   By: Kennith Center M.D.   On: 05/06/2023 06:29   CT CHEST ABDOMEN PELVIS WO CONTRAST  Result Date: 05/05/2023 CLINICAL DATA:  Sepsis.  Multiple myeloma.  * Tracking Code: BO * EXAM: CT CHEST, ABDOMEN AND PELVIS WITHOUT CONTRAST TECHNIQUE: Multidetector CT imaging of the chest, abdomen and pelvis was performed following the standard protocol without IV contrast. RADIATION DOSE REDUCTION: This exam was performed according to the departmental dose-optimization program which includes automated exposure control, adjustment of the mA and/or kV according to patient size and/or use of iterative reconstruction technique. COMPARISON:  Chest radiograph from earlier today. 12/23/2015 chest CT. 11/07/2015 CT abdomen/pelvis. FINDINGS: CT CHEST FINDINGS Substantially  motion degraded scan, limiting assessment. Cardiovascular: Normal heart size. No significant pericardial effusion/thickening. Three-vessel coronary atherosclerosis. Atherosclerotic nonaneurysmal thoracic aorta. Normal caliber pulmonary arteries. Mediastinum/Nodes: No significant thyroid nodules. Mildly patulous fluid-filled lower thoracic esophagus. No pathologically enlarged axillary, mediastinal or hilar lymph nodes, noting limited sensitivity for the detection of hilar adenopathy on this noncontrast study. Lungs/Pleura: No pneumothorax. No pleural effusion. Prominent eventration of the right hemidiaphragm with moderate passive right lung base atelectasis. Moderate compressive atelectasis in the medial left lower lobe from the large hiatal hernia. Otherwise no acute consolidative airspace disease, lung masses or gross pulmonary nodules on the motion degraded images. Musculoskeletal: Healed lateral right sixth through eighth rib fractures. Incompletely healed posterolateral left eighth and ninth rib fractures. Chronic patchy lucencies throughout visualized osseous structures, not appreciably changed. No new discrete lesions in the chest. Chronic mild superior T12 vertebral compression fracture, unchanged. CT ABDOMEN PELVIS FINDINGS Hepatobiliary: Mild diffuse hepatic steatosis. Normal liver size. No liver masses. Cholelithiasis. No gallbladder wall thickening or pericholecystic fluid. No biliary ductal dilatation. Pancreas: Normal, with no mass or duct dilation. Spleen: Normal size. No mass. Adrenals/Urinary Tract: Normal adrenals. No hydronephrosis. No renal stones. Simple 1.5 cm lower left renal cyst, for which no follow-up imaging is recommended. No additional contour deforming renal lesions. Normal bladder. Stomach/Bowel:  Large hiatal hernia. Stomach is not significantly distended. No acute gastric abnormality on these noncontrast motion degraded images. Small right inguinal hernia contains a small bowel loop,  chronic and unchanged, with no dilated or thick-walled small bowel loops. No focal small bowel caliber transition. No pneumatosis. Appendix not discretely visualized. Mild sigmoid diverticulosis with no large bowel wall thickening or significant pericolonic fat stranding. Vascular/Lymphatic: Atherosclerotic nonaneurysmal abdominal aorta. No pathologically enlarged lymph nodes in the abdomen or pelvis. Reproductive: Normal size prostate. Other: No pneumoperitoneum, ascites or focal fluid collection. Musculoskeletal: Mild patchy lucencies throughout the spine and pelvic girdle, chronic and not appreciably changed. No new focal osseous lesions. Moderate multilevel lumbar degenerative disc disease. IMPRESSION: 1. Substantially motion degraded scan, limiting assessment. No acute abnormality in the chest, abdomen or pelvis. 2. Chronic small right inguinal hernia containing a small bowel loop, with no evidence of acute bowel complication. No evidence of bowel obstruction or acute bowel inflammation. Mild sigmoid diverticulosis. 3. Prominent eventration of the right hemidiaphragm with moderate passive right lung base atelectasis. 4. Large hiatal hernia with moderate compressive atelectasis in the medial left lower lobe. 5. Three-vessel coronary atherosclerosis. 6. Mild diffuse hepatic steatosis. 7. Cholelithiasis. 8. Chronic patchy lucencies throughout the visualized osseous structures, compatible with reported history of multiple myeloma. No new focal osseous lesions. Chronic mild superior T12 vertebral compression fracture. 9.  Aortic Atherosclerosis (ICD10-I70.0). Electronically Signed   By: Delbert Phenix M.D.   On: 05/05/2023 19:20   DG Chest Portable 1 View  Result Date: 05/05/2023 CLINICAL DATA:  Dyspnea EXAM: PORTABLE CHEST 1 VIEW COMPARISON:  01/11/2023 chest radiograph. FINDINGS: Right rotated chest radiograph. Stable mild cardiomegaly. Left retrocardiac lucency, suspect at least moderate hiatal hernia,  unchanged. Stable mediastinal contour. No pneumothorax. No pleural effusion. Chronic prominent elevation of the right hemidiaphragm. No pulmonary edema. Mild bibasilar atelectasis. No acute consolidative airspace disease. IMPRESSION: 1. Stable mild cardiomegaly without pulmonary edema. 2. Chronic prominent elevation of the right hemidiaphragm with mild bibasilar atelectasis. 3. Stable left retrocardiac lucency, suspect at least moderate hiatal hernia. Electronically Signed   By: Delbert Phenix M.D.   On: 05/05/2023 17:43    Assessment/Plan 1. Bacteremia due to Pseudomonas ? Due to  Aspiration Pneumonia Finished Cipro Seems at his  Baseline now  2. Pancytopenia (HCC) Due to CART therapy Bone marrow done  Done results Pending  3. Iron deficiency anemia due to chronic blood loss Got PRBC Transfusion On Iron No GI Work up right now due to his Low Platelets 4. Acquired hypothyroidism TSH was low in Dr Fredderick Erb office Synthroid has been decreased to 125 mcg Repeat TSH   5. Neuropathy due to chemotherapeutic drug (HCC) Continues to  have issues with Falls Working with therapy and walking with hsi walker  6. Recurrent falls High Risk due to his Cognition and Neuropathy  7. Reactive depression (situational) On Lexapro  8. Multiple myeloma With Clinical Remission Per Oncology On Dapsone and Valtrex for Prophylaxis 9. Memory problem Combination of Vascular disease and CART     Family/ staff Communication:   Labs/tests ordered:

## 2023-05-16 ENCOUNTER — Non-Acute Institutional Stay: Payer: Self-pay | Admitting: Orthopedic Surgery

## 2023-05-16 ENCOUNTER — Encounter: Payer: Self-pay | Admitting: Orthopedic Surgery

## 2023-05-16 ENCOUNTER — Encounter (HOSPITAL_COMMUNITY): Payer: Self-pay | Admitting: Oncology

## 2023-05-16 DIAGNOSIS — R0902 Hypoxemia: Secondary | ICD-10-CM | POA: Diagnosis not present

## 2023-05-16 LAB — SURGICAL PATHOLOGY

## 2023-05-16 NOTE — Progress Notes (Unsigned)
Location:  [facility] Room Number: 153A Place of Service:  SNF [phone]) Provider:  Hazle Nordmann , NP  [name], MD  Patient Care Team: [name], MD as PCP - General (Internal Medicine) Shirlean Schlein, MD as Consulting Physician (Neurology) Eddie Candle, MD as Consulting Physician (Internal Medicine) York Spaniel, MD (Inactive) as Consulting Physician (Neurology) Sharlett Iles, DDS as Referring Physician (Oral Surgery) Ladene Artist, MD as Consulting Physician (Oncology) Fletcher Anon, NP as Nurse Practitioner (Nurse Practitioner)  Extended Emergency Contact Information Primary Emergency Contact: Sidney,[name] Address: [address]          [place], [place] [address] [name] of [place] Home Phone: [phone] Relation: Spouse  Code Status:  DNR Goals of care: Advanced Directive information    05/16/2023    3:14 PM  Advanced Directives  Does Patient Have a Medical Advance Directive? Yes  Type of Advance Directive Out of facility DNR (pink MOST or yellow form)  Does patient want to make changes to medical advance directive? No - Patient declined  Pre-existing out of facility DNR order (yellow form or pink MOST form) Yellow form placed in chart (order not valid for inpatient use)     Chief Complaint  Patient presents with   Acute Visit    Low oxygen   Immunizations    Patient needs updated Covid and Flu and pneumonia   Health Maintenance    AWV    HPI:  Pt is a 72 y.o. male seen today for an acute visit for    Past Medical History:  Diagnosis Date   Allergy    Anemia    Arthritis    Asthma    no treatment x 20 years   Depression    Double vision    occurs at times    Duodenal ulcer    GERD (gastroesophageal reflux disease)    Hyperlipidemia    Hypertension    Hypothyroidism    Multiple myeloma 07/04/2011   Thyroid disease    Past Surgical History:  Procedure Laterality  Date   BONE MARROW TRANSPLANT  2011   for MM   CARDIOLITE STUDY  11/25/2003   NORMAL   TONSILLECTOMY      Allergies  Allergen Reactions   Atorvastatin Other (See Comments)   Rosuvastatin     Other reaction(s): Liver Disorder   Crestor [Rosuvastatin Calcium]     ADVERSE EFFECTS ON LIVER   Lipitor [Atorvastatin Calcium]    Septra [Sulfamethoxazole-Trimethoprim] Rash    Outpatient Encounter Medications as of 05/16/2023  Medication Sig   acetaminophen (TYLENOL) 650 MG CR tablet Take 650 mg by mouth every 6 (six) hours as needed for pain.   calcium carbonate (TUMS EX) 750 MG chewable tablet Chew 2 tablets by mouth 3 (three) times daily as needed for heartburn.   calcium-vitamin D (OSCAL WITH D) 500-200 MG-UNIT tablet Take 1 tablet by mouth daily.   Cholecalciferol (VITAMIN D3) 25 MCG (1000 UT) capsule Take 1,000 Units by mouth daily.   cyanocobalamin (VITAMIN B12) 1000 MCG tablet Take 1 tablet (1,000 mcg total) by mouth daily.   dapsone 100 MG tablet Take 100 mg by mouth daily.   docusate sodium (COLACE) 100 MG capsule Take 100 mg by mouth daily as needed for mild constipation or moderate constipation.   escitalopram (LEXAPRO) 10 MG tablet Take 1 tablet (10 mg total) by mouth daily.   ferrous sulfate 325 (65 FE) MG EC tablet Take  325 mg by mouth 2 (two) times daily.   folic acid (FOLVITE) 1 MG tablet Take 1 tablet (1 mg total) by mouth daily.   levothyroxine (SYNTHROID) 125 MCG tablet Take 125 mcg by mouth daily.   methylphenidate (RITALIN) 5 MG tablet Take 0.5 tablets (2.5 mg total) by mouth daily. Each morning   pantoprazole (PROTONIX) 40 MG tablet Take 1 tablet (40 mg total) by mouth daily.   valACYclovir (VALTREX) 500 MG tablet Take 500 mg by mouth daily.   [DISCONTINUED] prochlorperazine (COMPAZINE) 10 MG tablet Take 1 tablet (10 mg total) by mouth every 6 (six) hours as needed (Nausea or vomiting).   No facility-administered encounter medications on file as of 05/16/2023.     Review of Systems  Immunization History  Administered Date(s) Administered   COVID-19, mRNA, vaccine(Comirnaty)12 years and older 07/05/2022   DTaP / Hep B / IPV 05/04/2017   DTaP / HiB / IPV 03/06/2018   Fluad Quad(high Dose 65+) 06/09/2019, 06/25/2020, 07/25/2021, 05/30/2022   Fluad Trivalent(High Dose 65+) 08/04/2012, 07/03/2014   HIB (PRP-T) 05/04/2017   Hepatitis B, ADULT 03/14/2017, 03/06/2018   IPV 01/28/1998   Influenza, High Dose Seasonal PF 06/16/2018   Influenza,inj,Quad PF,6+ Mos 06/16/2013, 07/03/2014, 06/11/2015, 05/31/2017   Influenza-Unspecified 07/29/2012   PFIZER(Purple Top)SARS-COV-2 Vaccination 10/26/2019, 11/20/2019, 07/22/2020   Pfizer Covid-19 Vaccine Bivalent Booster 8yrs & up 06/27/2021   Pneumococcal Conjugate-13 10/15/2013, 05/04/2017, 03/06/2018   Pneumococcal Polysaccharide-23 06/16/2013   Respiratory Syncytial Virus Vaccine,Recomb Aduvanted(Arexvy) 05/30/2022   Td 02/10/1998, 07/03/2014   Pertinent  Health Maintenance Due  Topic Date Due   INFLUENZA VACCINE  04/19/2023   Colonoscopy  09/24/2023      07/26/2022   10:22 AM 08/18/2022    9:00 AM 08/30/2022   11:01 AM 09/06/2022   10:00 AM 02/15/2023    1:29 PM  Fall Risk  Falls in the past year?     1  Was there an injury with Fall?     1  Fall Risk Category Calculator     3  (RETIRED) Patient Fall Risk Level Low fall risk Low fall risk Low fall risk Low fall risk   Fall risk Follow up     Falls evaluation completed   Functional Status Survey:    Vitals:   05/16/23 1510  BP: 138/78  Pulse: 69  Resp: 18  Temp: 98.1 F (36.7 C)  SpO2: 90%  Weight: 171 lb (77.6 kg)  Height: 5\' 7"  (1.702 m)   Body mass index is 26.78 kg/m. Physical Exam  Labs reviewed: Recent Labs    05/07/23 0720 05/08/23 0107 05/09/23 2357  NA 137 135 136  K 4.2 3.6 4.2  CL 104 105 102  CO2 24 24 26   GLUCOSE 98 94 95  BUN 15 16 19   CREATININE 0.87 1.04 0.91  CALCIUM 9.0 8.8* 9.1   Recent Labs     03/14/23 0755 04/09/23 1259 05/05/23 1901  AST 25 23 35  ALT 21 19 29   ALKPHOS 88 82 87  BILITOT 0.5 0.5 0.5  PROT 7.0 6.5 6.3*  ALBUMIN 4.3 4.1 3.7   Recent Labs    05/09/23 2357 05/10/23 1044 05/11/23 0348  WBC 2.4* 2.6* 2.2*  NEUTROABS 1.7 1.8 1.3*  HGB 9.2* 10.1* 9.7*  HCT 27.7* 30.9* 29.9*  MCV 111.2* 109.6* 107.9*  PLT 27* 30* 27*   Lab Results  Component Value Date   TSH 0.02 (L) 08/04/2021   Lab Results  Component Value Date   HGBA1C  4.3 (L) 05/06/2023   Lab Results  Component Value Date   CHOL 178 02/13/2023   HDL 42 02/13/2023   LDLCALC 113 02/13/2023   LDLDIRECT 155.8 11/13/2012   TRIG 167 (A) 02/13/2023   CHOLHDL 4 09/06/2017    Significant Diagnostic Results in last 30 days:  CT BONE MARROW BIOPSY & ASPIRATION  Result Date: 05/10/2023 INDICATION: History of multiple myeloma, now with pancytopenia. Please perform CT-guided bone marrow biopsy for tissue diagnostic purposes. EXAM: CT-GUIDED BONE MARROW BIOPSY AND ASPIRATION MEDICATIONS: None ANESTHESIA/SEDATION: Moderate (conscious) sedation was employed during this procedure as administered by the Interventional Radiology RN. A total of Versed 1 mg and Fentanyl 75 mcg was administered intravenously. Moderate Sedation Time: 10 minutes. [name] level of consciousness and vital signs were monitored continuously by radiology nursing throughout the procedure under my direct supervision. COMPLICATIONS: None immediate. PROCEDURE: Informed consent was obtained from the patient following an explanation of the procedure, risks, benefits and alternatives. The patient understands, agrees and consents for the procedure. All questions were addressed. A time out was performed prior to the initiation of the procedure. The patient was positioned prone and non-contrast localization CT was performed of the pelvis to demonstrate the iliac marrow spaces. The operative site was prepped and draped in the usual sterile fashion.  Under sterile conditions and local anesthesia, a 22 gauge spinal needle was utilized for procedural planning. Next, an 11 gauge coaxial bone biopsy needle was advanced into the left iliac marrow space. Needle position was confirmed with CT imaging. Initially, a bone marrow aspiration was performed. Next, a bone marrow biopsy was obtained with the 11 gauge outer bone marrow device. Samples were prepared with the cytotechnologist and deemed adequate. The needle was removed and superficial hemostasis was obtained with manual compression. A dressing was applied. The patient tolerated the procedure well without immediate post procedural complication. IMPRESSION: Successful CT guided left iliac bone marrow aspiration and core biopsy. Electronically Signed   By: Simonne Come M.D.   On: 05/10/2023 11:18   DG Swallowing Func-Speech Pathology  Result Date: 05/09/2023 Table formatting from the original result was not included. Modified Barium Swallow Study Patient Details Name: Andre [name] MRN: [id number] Date of Birth: 05/16/1951 Today's Date: 05/09/2023 HPI/PMH: HPI: Patient is a 72 y.o. male with PMH: GERD, HTN, HLD, thyroid disease, multiple myeloma, large hiatal hernia, dysphagia. He had an MBS at a different hospital with SLP reporting penetration with thin liquids via straw sips but not with cup sips. He presented to the hospital on 05/05/23 after being found down in his bed with gurgling respirations, emesis and was suctioned for large amount of gastric contents; he was hypoxemic and found to have aspiration PNA. Clinical Impression: Clinical Impression: Pt presents with a mild oropharyngeal dysphagia characterized primarily by impaired timing for which he compensates for independently. Pt has trace vallecular residue with thicker consistencies, which he senses and independently clears his thoat intermittently to clear from his pharynx. With larger boluses, the head of the bolus reaches the laryngeal vestibule  prior to epiglottic inversion and laryngeal elevation being initiated and results in trace, transient penetration (PAS 2) of thin liquids and nectar thick liquids via cup and straw. This is considered a normal age variant of swallowing and appears consistent with pt's previous MBS (10/20/22). This appeared to resolve with small, single cup sips. No further penetration or aspiration noted with honey thick liquids, purees, or solids. Pt was given the pill with thin liquids, which was noted  to be retained in the esophagus during the flouro sweep. SLP provided subsequent bites of purees x2, which cleared the pill from the esophagus completely. Recommend pt continue diet of Dys 3 textures with thin liquids via small, controlled cup sips. No further SLP f/u is necessary at this time. Factors that may increase risk of adverse event in presence of aspiration [name] & Clearance Coots 2021): Factors that may increase risk of adverse event in presence of aspiration [name] & Clearance Coots 2021): Reduced cognitive function Recommendations/Plan: Swallowing Evaluation Recommendations Swallowing Evaluation Recommendations Recommendations: PO diet PO Diet Recommendation: Dysphagia 3 (Mechanical soft); Thin liquids (Level 0) Liquid Administration via: Cup; No straw Medication Administration: Whole meds with puree Supervision: Staff to assist with self-feeding; Full supervision/cueing for swallowing strategies Swallowing strategies  : Minimize environmental distractions; Slow rate; Small bites/sips Postural changes: Position pt fully upright for meals Oral care recommendations: Oral care BID (2x/day) Treatment Plan Treatment Plan Treatment recommendations: No treatment recommended at this time Follow-up recommendations: No SLP follow up Functional status assessment: Patient has not had a recent decline in their functional status. Recommendations Recommendations for follow up therapy are one component of a multi-disciplinary discharge planning  process, led by the attending physician.  Recommendations may be updated based on patient status, additional functional criteria and insurance authorization. Assessment: Orofacial Exam: Orofacial Exam Oral Cavity: Oral Hygiene: WFL Oral Cavity - Dentition: Adequate natural dentition Orofacial Anatomy: WFL Oral Motor/Sensory Function: WFL Anatomy: Anatomy: Suspected cervical osteophytes Boluses Administered: Boluses Administered Boluses Administered: Thin liquids (Level 0); Mildly thick liquids (Level 2, nectar thick); Moderately thick liquids (Level 3, honey thick); Puree; Solid  Oral Impairment Domain: Oral Impairment Domain Lip Closure: No labial escape Tongue control during bolus hold: Cohesive bolus between tongue to palatal seal Bolus preparation/mastication: Timely and efficient chewing and mashing Bolus transport/lingual motion: Brisk tongue motion Oral residue: Complete oral clearance Location of oral residue : N/A Initiation of pharyngeal swallow : Valleculae  Pharyngeal Impairment Domain: Pharyngeal Impairment Domain Soft palate elevation: No bolus between soft palate (SP)/pharyngeal wall (PW) Laryngeal elevation: Complete superior movement of thyroid cartilage with complete approximation of arytenoids to epiglottic petiole Anterior hyoid excursion: Complete anterior movement Epiglottic movement: Complete inversion Laryngeal vestibule closure: Incomplete, narrow column air/contrast in laryngeal vestibule Pharyngeal stripping wave : Present - complete Pharyngeal contraction (A/P view only): N/A Pharyngoesophageal segment opening: Complete distension and complete duration, no obstruction of flow Tongue base retraction: No contrast between tongue base and posterior pharyngeal wall (PPW) Pharyngeal residue: Trace residue within or on pharyngeal structures Location of pharyngeal residue: Valleculae  Esophageal Impairment Domain: Esophageal Impairment Domain Esophageal clearance upright position: Esophageal  retention Pill: Pill Consistency administered: Thin liquids (Level 0); Puree Thin liquids (Level 0): Impaired (see clinical impressions) Puree: Impaired (see clinical impressions) Penetration/Aspiration Scale Score: Penetration/Aspiration Scale Score 1.  Material does not enter airway: Moderately thick liquids (Level 3, honey thick); Puree; Solid; Pill 2.  Material enters airway, remains ABOVE vocal cords then ejected out: Thin liquids (Level 0); Mildly thick liquids (Level 2, nectar thick) Compensatory Strategies: Compensatory Strategies Compensatory strategies: Yes Straw: Ineffective Ineffective Straw: Thin liquid (Level 0)   General Information: Caregiver present: No  Diet Prior to this Study: Dysphagia 3 (mechanical soft); Thin liquids (Level 0)   Temperature : Normal   Respiratory Status: WFL   Supplemental O2: Nasal cannula   History of Recent Intubation: No  Behavior/Cognition: Alert; Cooperative; Pleasant mood Self-Feeding Abilities: Needs assist with self-feeding Baseline vocal quality/speech: Normal Volitional Cough: Able to  elicit Volitional Swallow: Able to elicit Exam Limitations: No limitations Goal Planning: Prognosis for improved oropharyngeal function: Good Barriers to Reach Goals: Cognitive deficits No data recorded Patient/Family Stated Goal: nothing specific No data recorded Pain: Pain Assessment Pain Assessment: No/denies pain End of Session: Start Time:SLP Start Time (ACUTE ONLY): 0935 Stop Time: SLP Stop Time (ACUTE ONLY): 0958 Time Calculation:SLP Time Calculation (min) (ACUTE ONLY): 23 min Charges: SLP Evaluations $ SLP Speech Visit: 1 Visit SLP Evaluations $BSS Swallow: 1 Procedure $MBS Swallow: 1 Procedure SLP visit diagnosis: SLP Visit Diagnosis: Dysphagia, pharyngeal phase (R13.13) Past Medical History: Past Medical History: Diagnosis Date  Allergy   Anemia   Arthritis   Asthma   no treatment x 20 years  Depression   Double vision   occurs at times   Duodenal ulcer   GERD  (gastroesophageal reflux disease)   Hyperlipidemia   Hypertension   Hypothyroidism   Multiple myeloma 07/04/2011  Thyroid disease  Past Surgical History: Past Surgical History: Procedure Laterality Date  BONE MARROW TRANSPLANT  2011  for MM  CARDIOLITE STUDY  11/25/2003  NORMAL  TONSILLECTOMY   Gwynneth Aliment, M.A., CF-SLP Speech Language Pathology, Acute Rehabilitation Services Secure Chat preferred [phone] 05/09/2023, 1:49 PM  DG Chest Port 1 View  Result Date: 05/06/2023 CLINICAL DATA:  Respiratory failure. EXAM: PORTABLE CHEST 1 VIEW COMPARISON:  05/05/2023 FINDINGS: Stable asymmetric elevation right hemidiaphragm. Left base collapse/consolidation noted. Tiny effusions not excluded. Cardiopericardial silhouette is at upper limits of normal for size. No acute bony abnormality. Telemetry leads overlie the chest. IMPRESSION: Bibasilar collapse/consolidation, left greater than right. Asymmetric elevation right hemidiaphragm. Electronically Signed   By: Kennith Center M.D.   On: 05/06/2023 06:29   CT CHEST ABDOMEN PELVIS WO CONTRAST  Result Date: 05/05/2023 CLINICAL DATA:  Sepsis.  Multiple myeloma.  * Tracking Code: BO * EXAM: CT CHEST, ABDOMEN AND PELVIS WITHOUT CONTRAST TECHNIQUE: Multidetector CT imaging of the chest, abdomen and pelvis was performed following the standard protocol without IV contrast. RADIATION DOSE REDUCTION: This exam was performed according to the departmental dose-optimization program which includes automated exposure control, adjustment of the mA and/or kV according to patient size and/or use of iterative reconstruction technique. COMPARISON:  Chest radiograph from earlier today. 12/23/2015 chest CT. 11/07/2015 CT abdomen/pelvis. FINDINGS: CT CHEST FINDINGS Substantially motion degraded scan, limiting assessment. Cardiovascular: Normal heart size. No significant pericardial effusion/thickening. Three-vessel coronary atherosclerosis. Atherosclerotic nonaneurysmal thoracic aorta.  Normal caliber pulmonary arteries. Mediastinum/Nodes: No significant thyroid nodules. Mildly patulous fluid-filled lower thoracic esophagus. No pathologically enlarged axillary, mediastinal or hilar lymph nodes, noting limited sensitivity for the detection of hilar adenopathy on this noncontrast study. Lungs/Pleura: No pneumothorax. No pleural effusion. Prominent eventration of the right hemidiaphragm with moderate passive right lung base atelectasis. Moderate compressive atelectasis in the medial left lower lobe from the large hiatal hernia. Otherwise no acute consolidative airspace disease, lung masses or gross pulmonary nodules on the motion degraded images. Musculoskeletal: Healed lateral right sixth through eighth rib fractures. Incompletely healed posterolateral left eighth and ninth rib fractures. Chronic patchy lucencies throughout visualized osseous structures, not appreciably changed. No new discrete lesions in the chest. Chronic mild superior T12 vertebral compression fracture, unchanged. CT ABDOMEN PELVIS FINDINGS Hepatobiliary: Mild diffuse hepatic steatosis. Normal liver size. No liver masses. Cholelithiasis. No gallbladder wall thickening or pericholecystic fluid. No biliary ductal dilatation. Pancreas: Normal, with no mass or duct dilation. Spleen: Normal size. No mass. Adrenals/Urinary Tract: Normal adrenals. No hydronephrosis. No renal stones. Simple 1.5 cm lower left  renal cyst, for which no follow-up imaging is recommended. No additional contour deforming renal lesions. Normal bladder. Stomach/Bowel: Large hiatal hernia. Stomach is not significantly distended. No acute gastric abnormality on these noncontrast motion degraded images. Small right inguinal hernia contains a small bowel loop, chronic and unchanged, with no dilated or thick-walled small bowel loops. No focal small bowel caliber transition. No pneumatosis. Appendix not discretely visualized. Mild sigmoid diverticulosis with no large  bowel wall thickening or significant pericolonic fat stranding. Vascular/Lymphatic: Atherosclerotic nonaneurysmal abdominal aorta. No pathologically enlarged lymph nodes in the abdomen or pelvis. Reproductive: Normal size prostate. Other: No pneumoperitoneum, ascites or focal fluid collection. Musculoskeletal: Mild patchy lucencies throughout the spine and pelvic girdle, chronic and not appreciably changed. No new focal osseous lesions. Moderate multilevel lumbar degenerative disc disease. IMPRESSION: 1. Substantially motion degraded scan, limiting assessment. No acute abnormality in the chest, abdomen or pelvis. 2. Chronic small right inguinal hernia containing a small bowel loop, with no evidence of acute bowel complication. No evidence of bowel obstruction or acute bowel inflammation. Mild sigmoid diverticulosis. 3. Prominent eventration of the right hemidiaphragm with moderate passive right lung base atelectasis. 4. Large hiatal hernia with moderate compressive atelectasis in the medial left lower lobe. 5. Three-vessel coronary atherosclerosis. 6. Mild diffuse hepatic steatosis. 7. Cholelithiasis. 8. Chronic patchy lucencies throughout the visualized osseous structures, compatible with reported history of multiple myeloma. No new focal osseous lesions. Chronic mild superior T12 vertebral compression fracture. 9.  Aortic Atherosclerosis (ICD10-I70.0). Electronically Signed   By: Delbert Phenix M.D.   On: 05/05/2023 19:20   DG Chest Portable 1 View  Result Date: 05/05/2023 CLINICAL DATA:  Dyspnea EXAM: PORTABLE CHEST 1 VIEW COMPARISON:  01/11/2023 chest radiograph. FINDINGS: Right rotated chest radiograph. Stable mild cardiomegaly. Left retrocardiac lucency, suspect at least moderate hiatal hernia, unchanged. Stable mediastinal contour. No pneumothorax. No pleural effusion. Chronic prominent elevation of the right hemidiaphragm. No pulmonary edema. Mild bibasilar atelectasis. No acute consolidative airspace  disease. IMPRESSION: 1. Stable mild cardiomegaly without pulmonary edema. 2. Chronic prominent elevation of the right hemidiaphragm with mild bibasilar atelectasis. 3. Stable left retrocardiac lucency, suspect at least moderate hiatal hernia. Electronically Signed   By: Delbert Phenix M.D.   On: 05/05/2023 17:43   CT Head Wo Contrast  Result Date: 04/20/2023 CLINICAL DATA:  Head and neck trauma, fall. EXAM: CT HEAD WITHOUT CONTRAST CT CERVICAL SPINE WITHOUT CONTRAST TECHNIQUE: Multidetector CT imaging of the head and cervical spine was performed following the standard protocol without intravenous contrast. Multiplanar CT image reconstructions of the cervical spine were also generated. RADIATION DOSE REDUCTION: This exam was performed according to the departmental dose-optimization program which includes automated exposure control, adjustment of the mA and/or kV according to patient size and/or use of iterative reconstruction technique. COMPARISON:  01/11/2023. FINDINGS: CT HEAD FINDINGS Brain: No acute intracranial hemorrhage, midline shift or mass effect. No extra-axial fluid collection. Periventricular white matter hypodensities are present bilaterally. No hydrocephalus. 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 Vascular: No hyperdense vessel or unexpected calcification. Skull: No acute fracture. Multiple scattered lucencies are present in the calvarium which are unchanged from the prior exam. Sinuses/Orbits: No acute finding. Other: None. CT CERVICAL SPINE FINDINGS Alignment: Minimal anterolisthesis at C4-C5. Skull base and vertebrae: No acute fracture. No primary bone lesion or focal pathologic process. Soft tissues and spinal canal: No prevertebral fluid or swelling. No visible canal hematoma. Disc levels: Mild intervertebral disc space narrowing and uncovertebral osteophyte formation, most pronounced at  C5-C7. Mild facet arthropathy bilaterally. Upper chest: No acute abnormality. Other:  Carotid artery calcification. IMPRESSION: 1. Stable head CT with no acute intracranial process. 2. Multilevel degenerative changes in the cervical spine without evidence of acute fracture. Electronically Signed   By: Thornell Sartorius M.D.   On: 04/20/2023 00:36   CT Cervical Spine Wo Contrast  Result Date: 04/20/2023 CLINICAL DATA:  Head and neck trauma, fall. EXAM: CT HEAD WITHOUT CONTRAST CT CERVICAL SPINE WITHOUT CONTRAST TECHNIQUE: Multidetector CT imaging of the head and cervical spine was performed following the standard protocol without intravenous contrast. Multiplanar CT image reconstructions of the cervical spine were also generated. RADIATION DOSE REDUCTION: This exam was performed according to the departmental dose-optimization program which includes automated exposure control, adjustment of the mA and/or kV according to patient size and/or use of iterative reconstruction technique. COMPARISON:  01/11/2023. FINDINGS: CT HEAD FINDINGS Brain: No acute intracranial hemorrhage, midline shift or mass effect. No extra-axial fluid collection. Periventricular white matter hypodensities are present bilaterally. No hydrocephalus. 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 Vascular: No hyperdense vessel or unexpected calcification. Skull: No acute fracture. Multiple scattered lucencies are present in the calvarium which are unchanged from the prior exam. Sinuses/Orbits: No acute finding. Other: None. CT CERVICAL SPINE FINDINGS Alignment: Minimal anterolisthesis at C4-C5. Skull base and vertebrae: No acute fracture. No primary bone lesion or focal pathologic process. Soft tissues and spinal canal: No prevertebral fluid or swelling. No visible canal hematoma. Disc levels: Mild intervertebral disc space narrowing and uncovertebral osteophyte formation, most pronounced at C5-C7. Mild facet arthropathy bilaterally. Upper chest: No acute abnormality. Other: Carotid artery calcification. IMPRESSION: 1.  Stable head CT with no acute intracranial process. 2. Multilevel degenerative changes in the cervical spine without evidence of acute fracture. Electronically Signed   By: Thornell Sartorius M.D.   On: 04/20/2023 00:36    Assessment/Plan There are no diagnoses linked to this encounter.   Family/ staff Communication: ***  Labs/tests ordered:  ***

## 2023-05-16 NOTE — Progress Notes (Unsigned)
Location:  [facility] Room Number: 153/A Place of Service:  SNF [phone]) Provider:  Octavia Heir, NP   [name], MD  Patient Care Team: [name], MD as PCP - General (Internal Medicine) Shirlean Schlein, MD as Consulting Physician (Neurology) Eddie Candle, MD as Consulting Physician (Internal Medicine) York Spaniel, MD (Inactive) as Consulting Physician (Neurology) Sharlett Iles, DDS as Referring Physician (Oral Surgery) Ladene Artist, MD as Consulting Physician (Oncology) Fletcher Anon, NP as Nurse Practitioner (Nurse Practitioner)  Extended Emergency Contact Information Primary Emergency Contact: Bonini,[name] Address: [address]          [place], [place] [address] [name] Home Phone: [phone] Relation: Spouse  Code Status:  DNR Goals of care: Advanced Directive information    05/16/2023    3:14 PM  Advanced Directives  Does Patient Have a Medical Advance Directive? Yes  Type of Advance Directive Out of facility DNR (pink MOST or yellow form)  Does patient want to make changes to medical advance directive? No - Patient declined  Pre-existing out of facility DNR order (yellow form or pink MOST form) Yellow form placed in chart (order not valid for inpatient use)     Chief Complaint  Patient presents with  . Acute Visit    Hypoxia     HPI:  Pt is a 72 y.o. male seen today for acute visit due to hypoxia.   He currently resides on the rehab unit at [facility]. PMH: TIA, asthma, OSA, GERD, peptic ulcer disease, hypothyroidism, chemotherapy related neuropathy, multiple myeloma, memory impairment, anemia, weakness and constipation .   Hospitalized 08/17- 08/23 due to acute hypoxemia respiratory failure associated with sepsis/ pseudomonas aeruginosa bacteremia. Thought to be related to aspiration pneumonia per ID. He improved with IV cefepime and was discharged on Cipro 750 mg  BID x 2 days. 08/25 he completed cipro. Today, nursing reports oxygen sats 85-87% on RA. Sats improved to 90% on 2 liters oxygen. Afebrile. Respirations unlabored. He denies chest pain or shortness of breath. He is using incentive spirometer. He was given duoneb once and sats improved to 94%.     Past Medical History:  Diagnosis Date  . Allergy   . Anemia   . Arthritis   . Asthma    no treatment x 20 years  . Depression   . Double vision    occurs at times   . Duodenal ulcer   . GERD (gastroesophageal reflux disease)   . Hyperlipidemia   . Hypertension   . Hypothyroidism   . Multiple myeloma 07/04/2011  . Thyroid disease    Past Surgical History:  Procedure Laterality Date  . BONE MARROW TRANSPLANT  2011   for MM  . CARDIOLITE STUDY  11/25/2003   NORMAL  . TONSILLECTOMY      Allergies  Allergen Reactions  . Atorvastatin Other (See Comments)  . Rosuvastatin     Other reaction(s): Liver Disorder  . Crestor [Rosuvastatin Calcium]     ADVERSE EFFECTS ON LIVER  . Lipitor [Atorvastatin Calcium]   . Septra [Sulfamethoxazole-Trimethoprim] Rash    Outpatient Encounter Medications as of 05/16/2023  Medication Sig  . acetaminophen (TYLENOL) 650 MG CR tablet Take 650 mg by mouth every 6 (six) hours as needed for pain.  . calcium carbonate (TUMS EX) 750 MG chewable tablet Chew 2 tablets by mouth 3 (three) times daily as needed for heartburn.  . calcium-vitamin D (OSCAL WITH D) 500-200 MG-UNIT  tablet Take 1 tablet by mouth daily.  . Cholecalciferol (VITAMIN D3) 25 MCG (1000 UT) capsule Take 1,000 Units by mouth daily.  . cyanocobalamin (VITAMIN B12) 1000 MCG tablet Take 1 tablet (1,000 mcg total) by mouth daily.  . dapsone 100 MG tablet Take 100 mg by mouth daily.  [name] docusate sodium (COLACE) 100 MG capsule Take 100 mg by mouth daily as needed for mild constipation or moderate constipation.  [name] escitalopram (LEXAPRO) 10 MG tablet Take 1 tablet (10 mg total) by mouth daily.  . ferrous  sulfate 325 (65 FE) MG EC tablet Take 325 mg by mouth 2 (two) times daily.  . folic acid (FOLVITE) 1 MG tablet Take 1 tablet (1 mg total) by mouth daily.  [name] ipratropium (ATROVENT) 0.02 % nebulizer solution Take 0.5 mg by nebulization 2 (two) times daily as needed for wheezing or shortness of breath.  . levothyroxine (SYNTHROID) 125 MCG tablet Take 125 mcg by mouth daily.  . methylphenidate (RITALIN) 5 MG tablet Take 0.5 tablets (2.5 mg total) by mouth daily. Each morning  . pantoprazole (PROTONIX) 40 MG tablet Take 1 tablet (40 mg total) by mouth daily.  . valACYclovir (VALTREX) 500 MG tablet Take 500 mg by mouth daily.  . [DISCONTINUED] prochlorperazine (COMPAZINE) 10 MG tablet Take 1 tablet (10 mg total) by mouth every 6 (six) hours as needed (Nausea or vomiting).   No facility-administered encounter medications on file as of 05/16/2023.    Review of Systems  Immunization History  Administered Date(s) Administered  . COVID-19, mRNA, vaccine(Comirnaty)12 years and older 07/05/2022  . DTaP / Hep B / IPV 05/04/2017  . DTaP / HiB / IPV 03/06/2018  . Fluad Quad(high Dose 65+) 06/09/2019, 06/25/2020, 07/25/2021, 05/30/2022  . Fluad Trivalent(High Dose 65+) 08/04/2012, 07/03/2014  . HIB (PRP-T) 05/04/2017  . Hepatitis B, ADULT 03/14/2017, 03/06/2018  . IPV 01/28/1998  . Influenza, High Dose Seasonal PF 06/16/2018  . Influenza,inj,Quad PF,6+ Mos 06/16/2013, 07/03/2014, 06/11/2015, 05/31/2017  . Influenza-Unspecified 07/29/2012  . PFIZER(Purple Top)SARS-COV-2 Vaccination 10/26/2019, 11/20/2019, 07/22/2020  . Research officer, trade [employer] 93yrs & up 06/27/2021  . Pneumococcal Conjugate-13 10/15/2013, 05/04/2017, 03/06/2018  . Pneumococcal Polysaccharide-23 06/16/2013  . Respiratory Syncytial Virus Vaccine,Recomb Aduvanted(Arexvy) 05/30/2022  . Td 02/10/1998, 07/03/2014   Pertinent  Health Maintenance Due  Topic Date Due  . INFLUENZA VACCINE  04/19/2023  . Colonoscopy   09/24/2023      07/26/2022   10:22 AM 08/18/2022    9:00 AM 08/30/2022   11:01 AM 09/06/2022   10:00 AM 02/15/2023    1:29 PM  Fall Risk  Falls in the past year?     1  Was there an injury with Fall?     1  Fall Risk Category Calculator     3  (RETIRED) Patient Fall Risk Level Low fall risk Low fall risk Low fall risk Low fall risk   Fall risk Follow up     Falls evaluation completed   Functional Status Survey:    Vitals:   05/16/23 1548  BP: 138/73  Pulse: 69  Resp: 16  Temp: 98.1 F (36.7 C)  SpO2: 90%  Weight: 171 lb (77.6 kg)  Height: 5\' 7"  (1.702 m)   Body mass index is 26.78 kg/m. Physical Exam  Labs reviewed: Recent Labs    05/07/23 0720 05/08/23 0107 05/09/23 2357  NA 137 135 136  K 4.2 3.6 4.2  CL 104 105 102  CO2 24 24 26   GLUCOSE 98 94 95  BUN 15 16 19   CREATININE 0.87 1.04 0.91  CALCIUM 9.0 8.8* 9.1   Recent Labs    03/14/23 0755 04/09/23 1259 05/05/23 1901  AST 25 23 35  ALT 21 19 29   ALKPHOS 88 82 87  BILITOT 0.5 0.5 0.5  PROT 7.0 6.5 6.3*  ALBUMIN 4.3 4.1 3.7   Recent Labs    05/09/23 2357 05/10/23 1044 05/11/23 0348  WBC 2.4* 2.6* 2.2*  NEUTROABS 1.7 1.8 1.3*  HGB 9.2* 10.1* 9.7*  HCT 27.7* 30.9* 29.9*  MCV 111.2* 109.6* 107.9*  PLT 27* 30* 27*   Lab Results  Component Value Date   TSH 0.02 (L) 08/04/2021   Lab Results  Component Value Date   HGBA1C 4.3 (L) 05/06/2023   Lab Results  Component Value Date   CHOL 178 02/13/2023   HDL 42 02/13/2023   LDLCALC 113 02/13/2023   LDLDIRECT 155.8 11/13/2012   TRIG 167 (A) 02/13/2023   CHOLHDL 4 09/06/2017    Significant Diagnostic Results in last 30 days:  CT BONE MARROW BIOPSY & ASPIRATION  Result Date: 05/10/2023 INDICATION: History of multiple myeloma, now with pancytopenia. Please perform CT-guided bone marrow biopsy for tissue diagnostic purposes. EXAM: CT-GUIDED BONE MARROW BIOPSY AND ASPIRATION MEDICATIONS: None ANESTHESIA/SEDATION: Moderate (conscious) sedation  was employed during this procedure as administered by the Interventional Radiology RN. A total of Versed 1 mg and Fentanyl 75 mcg was administered intravenously. Moderate Sedation Time: 10 minutes. [name] level of consciousness and vital signs were monitored continuously by radiology nursing throughout the procedure under my direct supervision. COMPLICATIONS: None immediate. PROCEDURE: Informed consent was obtained from the patient following an explanation of the procedure, risks, benefits and alternatives. The patient understands, agrees and consents for the procedure. All questions were addressed. A time out was performed prior to the initiation of the procedure. The patient was positioned prone and non-contrast localization CT was performed of the pelvis to demonstrate the iliac marrow spaces. The operative site was prepped and draped in the usual sterile fashion. Under sterile conditions and local anesthesia, a 22 gauge spinal needle was utilized for procedural planning. Next, an 11 gauge coaxial bone biopsy needle was advanced into the left iliac marrow space. Needle position was confirmed with CT imaging. Initially, a bone marrow aspiration was performed. Next, a bone marrow biopsy was obtained with the 11 gauge outer bone marrow device. Samples were prepared with the cytotechnologist and deemed adequate. The needle was removed and superficial hemostasis was obtained with manual compression. A dressing was applied. The patient tolerated the procedure well without immediate post procedural complication. IMPRESSION: Successful CT guided left iliac bone marrow aspiration and core biopsy. Electronically Signed   By: Simonne Come M.D.   On: 05/10/2023 11:18   DG Swallowing Func-Speech Pathology  Result Date: 05/09/2023 Table formatting from the original result was not included. Modified Barium Swallow Study Patient Details Name: ROEN TEBEAU MRN: [id number] Date of Birth: [birth year] Today's Date:  05/09/2023 HPI/PMH: HPI: Patient is a 72 y.o. male with PMH: GERD, HTN, HLD, thyroid disease, multiple myeloma, large hiatal hernia, dysphagia. He had an MBS at a different hospital with SLP reporting penetration with thin liquids via straw sips but not with cup sips. He presented to the hospital on 05/05/23 after being found down in his bed with gurgling respirations, emesis and was suctioned for large amount of gastric contents; he was hypoxemic and found to have aspiration PNA. Clinical Impression: Clinical Impression: Pt presents with a mild  oropharyngeal dysphagia characterized primarily by impaired timing for which he compensates for independently. Pt has trace vallecular residue with thicker consistencies, which he senses and independently clears his thoat intermittently to clear from his pharynx. With larger boluses, the head of the bolus reaches the laryngeal vestibule prior to epiglottic inversion and laryngeal elevation being initiated and results in trace, transient penetration (PAS 2) of thin liquids and nectar thick liquids via cup and straw. This is considered a normal age variant of swallowing and appears consistent with pt's previous MBS (10/20/22). This appeared to resolve with small, single cup sips. No further penetration or aspiration noted with honey thick liquids, purees, or solids. Pt was given the pill with thin liquids, which was noted to be retained in the esophagus during the flouro sweep. SLP provided subsequent bites of purees x2, which cleared the pill from the esophagus completely. Recommend pt continue diet of Dys 3 textures with thin liquids via small, controlled cup sips. No further SLP f/u is necessary at this time. Factors that may increase risk of adverse event in presence of aspiration [name] & Clearance Coots 2021): Factors that may increase risk of adverse event in presence of aspiration [name] & Clearance Coots 2021): Reduced cognitive function Recommendations/Plan: Swallowing Evaluation  Recommendations Swallowing Evaluation Recommendations Recommendations: PO diet PO Diet Recommendation: Dysphagia 3 (Mechanical soft); Thin liquids (Level 0) Liquid Administration via: Cup; No straw Medication Administration: Whole meds with puree Supervision: Staff to assist with self-feeding; Full supervision/cueing for swallowing strategies Swallowing strategies  : Minimize environmental distractions; Slow rate; Small bites/sips Postural changes: Position pt fully upright for meals Oral care recommendations: Oral care BID (2x/day) Treatment Plan Treatment Plan Treatment recommendations: No treatment recommended at this time Follow-up recommendations: No SLP follow up Functional status assessment: Patient has not had a recent decline in their functional status. Recommendations Recommendations for follow up therapy are one component of a multi-disciplinary discharge planning process, led by the attending physician.  Recommendations may be updated based on patient status, additional functional criteria and insurance authorization. Assessment: Orofacial Exam: Orofacial Exam Oral Cavity: Oral Hygiene: WFL Oral Cavity - Dentition: Adequate natural dentition Orofacial Anatomy: WFL Oral Motor/Sensory Function: WFL Anatomy: Anatomy: Suspected cervical osteophytes Boluses Administered: Boluses Administered Boluses Administered: Thin liquids (Level 0); Mildly thick liquids (Level 2, nectar thick); Moderately thick liquids (Level 3, honey thick); Puree; Solid  Oral Impairment Domain: Oral Impairment Domain Lip Closure: No labial escape Tongue control during bolus hold: Cohesive bolus between tongue to palatal seal Bolus preparation/mastication: Timely and efficient chewing and mashing Bolus transport/lingual motion: Brisk tongue motion Oral residue: Complete oral clearance Location of oral residue : N/A Initiation of pharyngeal swallow : Valleculae  Pharyngeal Impairment Domain: Pharyngeal Impairment Domain Soft palate  elevation: No bolus between soft palate (SP)/pharyngeal wall (PW) Laryngeal elevation: Complete superior movement of thyroid cartilage with complete approximation of arytenoids to epiglottic petiole Anterior hyoid excursion: Complete anterior movement Epiglottic movement: Complete inversion Laryngeal vestibule closure: Incomplete, narrow column air/contrast in laryngeal vestibule Pharyngeal stripping wave : Present - complete Pharyngeal contraction (A/P view only): N/A Pharyngoesophageal segment opening: Complete distension and complete duration, no obstruction of flow Tongue base retraction: No contrast between tongue base and posterior pharyngeal wall (PPW) Pharyngeal residue: Trace residue within or on pharyngeal structures Location of pharyngeal residue: Valleculae  Esophageal Impairment Domain: Esophageal Impairment Domain Esophageal clearance upright position: Esophageal retention Pill: Pill Consistency administered: Thin liquids (Level 0); Puree Thin liquids (Level 0): Impaired (see clinical impressions) Puree: Impaired (see clinical impressions)  Penetration/Aspiration Scale Score: Penetration/Aspiration Scale Score 1.  Material does not enter airway: Moderately thick liquids (Level 3, honey thick); Puree; Solid; Pill 2.  Material enters airway, remains ABOVE vocal cords then ejected out: Thin liquids (Level 0); Mildly thick liquids (Level 2, nectar thick) Compensatory Strategies: Compensatory Strategies Compensatory strategies: Yes Straw: Ineffective Ineffective Straw: Thin liquid (Level 0)   General Information: Caregiver present: No  Diet Prior to this Study: Dysphagia 3 (mechanical soft); Thin liquids (Level 0)   Temperature : Normal   Respiratory Status: WFL   Supplemental O2: Nasal cannula   History of Recent Intubation: No  Behavior/Cognition: Alert; Cooperative; Pleasant mood Self-Feeding Abilities: Needs assist with self-feeding Baseline vocal quality/speech: Normal Volitional Cough: Able to elicit  Volitional Swallow: Able to elicit Exam Limitations: No limitations Goal Planning: Prognosis for improved oropharyngeal function: Good Barriers to Reach Goals: Cognitive deficits No data recorded Patient/Family Stated Goal: nothing specific No data recorded Pain: Pain Assessment Pain Assessment: No/denies pain End of Session: Start Time:SLP Start Time (ACUTE ONLY): 0935 Stop Time: SLP Stop Time (ACUTE ONLY): 0958 Time Calculation:SLP Time Calculation (min) (ACUTE ONLY): 23 min Charges: SLP Evaluations $ SLP Speech Visit: 1 Visit SLP Evaluations $BSS Swallow: 1 Procedure $MBS Swallow: 1 Procedure SLP visit diagnosis: SLP Visit Diagnosis: Dysphagia, pharyngeal phase (R13.13) Past Medical History: Past Medical History: Diagnosis Date . Allergy  . Anemia  . Arthritis  . Asthma   no treatment x 20 years . Depression  . Double vision   occurs at times  . Duodenal ulcer  . GERD (gastroesophageal reflux disease)  . Hyperlipidemia  . Hypertension  . Hypothyroidism  . Multiple myeloma 07/04/2011 . Thyroid disease  Past Surgical History: Past Surgical History: Procedure Laterality Date . BONE MARROW TRANSPLANT  2011  for MM . CARDIOLITE STUDY  11/25/2003  NORMAL . TONSILLECTOMY   Gwynneth Aliment, M.A., CF-SLP Speech Language Pathology, Acute Rehabilitation Services Secure Chat preferred [phone] 05/09/2023, 1:49 PM  DG Chest Port 1 View  Result Date: 05/06/2023 CLINICAL DATA:  Respiratory failure. EXAM: PORTABLE CHEST 1 VIEW COMPARISON:  05/05/2023 FINDINGS: Stable asymmetric elevation right hemidiaphragm. Left base collapse/consolidation noted. Tiny effusions not excluded. Cardiopericardial silhouette is at upper limits of normal for size. No acute bony abnormality. Telemetry leads overlie the chest. IMPRESSION: Bibasilar collapse/consolidation, left greater than right. Asymmetric elevation right hemidiaphragm. Electronically Signed   By: Kennith Center M.D.   On: 05/06/2023 06:29   CT CHEST ABDOMEN PELVIS WO  CONTRAST  Result Date: 05/05/2023 CLINICAL DATA:  Sepsis.  Multiple myeloma.  * Tracking Code: BO * EXAM: CT CHEST, ABDOMEN AND PELVIS WITHOUT CONTRAST TECHNIQUE: Multidetector CT imaging of the chest, abdomen and pelvis was performed following the standard protocol without IV contrast. RADIATION DOSE REDUCTION: This exam was performed according to the departmental dose-optimization program which includes automated exposure control, adjustment of the mA and/or kV according to patient size and/or use of iterative reconstruction technique. COMPARISON:  Chest radiograph from earlier today. 12/23/2015 chest CT. 11/07/2015 CT abdomen/pelvis. FINDINGS: CT CHEST FINDINGS Substantially motion degraded scan, limiting assessment. Cardiovascular: Normal heart size. No significant pericardial effusion/thickening. Three-vessel coronary atherosclerosis. Atherosclerotic nonaneurysmal thoracic aorta. Normal caliber pulmonary arteries. Mediastinum/Nodes: No significant thyroid nodules. Mildly patulous fluid-filled lower thoracic esophagus. No pathologically enlarged axillary, mediastinal or hilar lymph nodes, noting limited sensitivity for the detection of hilar adenopathy on this noncontrast study. Lungs/Pleura: No pneumothorax. No pleural effusion. Prominent eventration of the right hemidiaphragm with moderate passive right lung base atelectasis. Moderate compressive atelectasis  in the medial left lower lobe from the large hiatal hernia. Otherwise no acute consolidative airspace disease, lung masses or gross pulmonary nodules on the motion degraded images. Musculoskeletal: Healed lateral right sixth through eighth rib fractures. Incompletely healed posterolateral left eighth and ninth rib fractures. Chronic patchy lucencies throughout visualized osseous structures, not appreciably changed. No new discrete lesions in the chest. Chronic mild superior T12 vertebral compression fracture, unchanged. CT ABDOMEN PELVIS FINDINGS  Hepatobiliary: Mild diffuse hepatic steatosis. Normal liver size. No liver masses. Cholelithiasis. No gallbladder wall thickening or pericholecystic fluid. No biliary ductal dilatation. Pancreas: Normal, with no mass or duct dilation. Spleen: Normal size. No mass. Adrenals/Urinary Tract: Normal adrenals. No hydronephrosis. No renal stones. Simple 1.5 cm lower left renal cyst, for which no follow-up imaging is recommended. No additional contour deforming renal lesions. Normal bladder. Stomach/Bowel: Large hiatal hernia. Stomach is not significantly distended. No acute gastric abnormality on these noncontrast motion degraded images. Small right inguinal hernia contains a small bowel loop, chronic and unchanged, with no dilated or thick-walled small bowel loops. No focal small bowel caliber transition. No pneumatosis. Appendix not discretely visualized. Mild sigmoid diverticulosis with no large bowel wall thickening or significant pericolonic fat stranding. Vascular/Lymphatic: Atherosclerotic nonaneurysmal abdominal aorta. No pathologically enlarged lymph nodes in the abdomen or pelvis. Reproductive: Normal size prostate. Other: No pneumoperitoneum, ascites or focal fluid collection. Musculoskeletal: Mild patchy lucencies throughout the spine and pelvic girdle, chronic and not appreciably changed. No new focal osseous lesions. Moderate multilevel lumbar degenerative disc disease. IMPRESSION: 1. Substantially motion degraded scan, limiting assessment. No acute abnormality in the chest, abdomen or pelvis. 2. Chronic small right inguinal hernia containing a small bowel loop, with no evidence of acute bowel complication. No evidence of bowel obstruction or acute bowel inflammation. Mild sigmoid diverticulosis. 3. Prominent eventration of the right hemidiaphragm with moderate passive right lung base atelectasis. 4. Large hiatal hernia with moderate compressive atelectasis in the medial left lower lobe. 5. Three-vessel  coronary atherosclerosis. 6. Mild diffuse hepatic steatosis. 7. Cholelithiasis. 8. Chronic patchy lucencies throughout the visualized osseous structures, compatible with reported history of multiple myeloma. No new focal osseous lesions. Chronic mild superior T12 vertebral compression fracture. 9.  Aortic Atherosclerosis (ICD10-I70.0). Electronically Signed   By: Delbert Phenix M.D.   On: 05/05/2023 19:20   DG Chest Portable 1 View  Result Date: 05/05/2023 CLINICAL DATA:  Dyspnea EXAM: PORTABLE CHEST 1 VIEW COMPARISON:  01/11/2023 chest radiograph. FINDINGS: Right rotated chest radiograph. Stable mild cardiomegaly. Left retrocardiac lucency, suspect at least moderate hiatal hernia, unchanged. Stable mediastinal contour. No pneumothorax. No pleural effusion. Chronic prominent elevation of the right hemidiaphragm. No pulmonary edema. Mild bibasilar atelectasis. No acute consolidative airspace disease. IMPRESSION: 1. Stable mild cardiomegaly without pulmonary edema. 2. Chronic prominent elevation of the right hemidiaphragm with mild bibasilar atelectasis. 3. Stable left retrocardiac lucency, suspect at least moderate hiatal hernia. Electronically Signed   By: Delbert Phenix M.D.   On: 05/05/2023 17:43   CT Head Wo Contrast  Result Date: 04/20/2023 CLINICAL DATA:  Head and neck trauma, fall. EXAM: CT HEAD WITHOUT CONTRAST CT CERVICAL SPINE WITHOUT CONTRAST TECHNIQUE: Multidetector CT imaging of the head and cervical spine was performed following the standard protocol without intravenous contrast. Multiplanar CT image reconstructions of the cervical spine were also generated. RADIATION DOSE REDUCTION: This exam was performed according to the departmental dose-optimization program which includes automated exposure control, adjustment of the mA and/or kV according to patient size and/or use of iterative reconstruction  technique. COMPARISON:  01/11/2023. FINDINGS: CT HEAD FINDINGS Brain: No acute intracranial hemorrhage,  midline shift or mass effect. No extra-axial fluid collection. Periventricular white matter hypodensities are present bilaterally. No hydrocephalus. 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 Vascular: No hyperdense vessel or unexpected calcification. Skull: No acute fracture. Multiple scattered lucencies are present in the calvarium which are unchanged from the prior exam. Sinuses/Orbits: No acute finding. Other: None. CT CERVICAL SPINE FINDINGS Alignment: Minimal anterolisthesis at C4-C5. Skull base and vertebrae: No acute fracture. No primary bone lesion or focal pathologic process. Soft tissues and spinal canal: No prevertebral fluid or swelling. No visible canal hematoma. Disc levels: Mild intervertebral disc space narrowing and uncovertebral osteophyte formation, most pronounced at C5-C7. Mild facet arthropathy bilaterally. Upper chest: No acute abnormality. Other: Carotid artery calcification. IMPRESSION: 1. Stable head CT with no acute intracranial process. 2. Multilevel degenerative changes in the cervical spine without evidence of acute fracture. Electronically Signed   By: Thornell Sartorius M.D.   On: 04/20/2023 00:36   CT Cervical Spine Wo Contrast  Result Date: 04/20/2023 CLINICAL DATA:  Head and neck trauma, fall. EXAM: CT HEAD WITHOUT CONTRAST CT CERVICAL SPINE WITHOUT CONTRAST TECHNIQUE: Multidetector CT imaging of the head and cervical spine was performed following the standard protocol without intravenous contrast. Multiplanar CT image reconstructions of the cervical spine were also generated. RADIATION DOSE REDUCTION: This exam was performed according to the departmental dose-optimization program which includes automated exposure control, adjustment of the mA and/or kV according to patient size and/or use of iterative reconstruction technique. COMPARISON:  01/11/2023. FINDINGS: CT HEAD FINDINGS Brain: No acute intracranial hemorrhage, midline shift or mass effect. No extra-axial  fluid collection. Periventricular white matter hypodensities are present bilaterally. No hydrocephalus. 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 Vascular: No hyperdense vessel or unexpected calcification. Skull: No acute fracture. Multiple scattered lucencies are present in the calvarium which are unchanged from the prior exam. Sinuses/Orbits: No acute finding. Other: None. CT CERVICAL SPINE FINDINGS Alignment: Minimal anterolisthesis at C4-C5. Skull base and vertebrae: No acute fracture. No primary bone lesion or focal pathologic process. Soft tissues and spinal canal: No prevertebral fluid or swelling. No visible canal hematoma. Disc levels: Mild intervertebral disc space narrowing and uncovertebral osteophyte formation, most pronounced at C5-C7. Mild facet arthropathy bilaterally. Upper chest: No acute abnormality. Other: Carotid artery calcification. IMPRESSION: 1. Stable head CT with no acute intracranial process. 2. Multilevel degenerative changes in the cervical spine without evidence of acute fracture. Electronically Signed   By: Thornell Sartorius M.D.   On: 04/20/2023 00:36    Assessment/Plan There are no diagnoses linked to this encounter.   Family/ staff Communication: ***  Labs/tests ordered:  ***

## 2023-05-17 LAB — BASIC METABOLIC PANEL WITH GFR: eGFR: 84

## 2023-05-17 MED ORDER — IPRATROPIUM-ALBUTEROL 0.5-2.5 (3) MG/3ML IN SOLN
3.0000 mL | Freq: Two times a day (BID) | RESPIRATORY_TRACT | Status: DC | PRN
Start: 2023-05-17 — End: 2023-06-07

## 2023-05-17 NOTE — Progress Notes (Signed)
This encounter was created in error - please disregard.

## 2023-05-20 ENCOUNTER — Emergency Department (HOSPITAL_COMMUNITY): Payer: Medicare Other

## 2023-05-20 ENCOUNTER — Inpatient Hospital Stay (HOSPITAL_COMMUNITY)
Admission: EM | Admit: 2023-05-20 | Discharge: 2023-05-25 | DRG: 871 | Disposition: A | Discharge status: Skilled Nursing Facility | Payer: Medicare Other | Attending: Internal Medicine | Admitting: Internal Medicine

## 2023-05-20 ENCOUNTER — Other Ambulatory Visit: Payer: Self-pay

## 2023-05-20 ENCOUNTER — Encounter (HOSPITAL_COMMUNITY): Payer: Self-pay

## 2023-05-20 DIAGNOSIS — Z79890 Hormone replacement therapy (postmenopausal): Secondary | ICD-10-CM

## 2023-05-20 DIAGNOSIS — Z888 Allergy status to other drugs, medicaments and biological substances status: Secondary | ICD-10-CM

## 2023-05-20 DIAGNOSIS — R4182 Altered mental status, unspecified: Secondary | ICD-10-CM | POA: Diagnosis present

## 2023-05-20 DIAGNOSIS — C9001 Multiple myeloma in remission: Secondary | ICD-10-CM | POA: Diagnosis present

## 2023-05-20 DIAGNOSIS — I061 Rheumatic aortic insufficiency: Secondary | ICD-10-CM | POA: Diagnosis present

## 2023-05-20 DIAGNOSIS — J9601 Acute respiratory failure with hypoxia: Secondary | ICD-10-CM | POA: Diagnosis present

## 2023-05-20 DIAGNOSIS — I10 Essential (primary) hypertension: Secondary | ICD-10-CM | POA: Diagnosis present

## 2023-05-20 DIAGNOSIS — M1990 Unspecified osteoarthritis, unspecified site: Secondary | ICD-10-CM | POA: Diagnosis present

## 2023-05-20 DIAGNOSIS — E039 Hypothyroidism, unspecified: Secondary | ICD-10-CM | POA: Diagnosis present

## 2023-05-20 DIAGNOSIS — Z8701 Personal history of pneumonia (recurrent): Secondary | ICD-10-CM

## 2023-05-20 DIAGNOSIS — A4152 Sepsis due to Pseudomonas: Secondary | ICD-10-CM | POA: Diagnosis present

## 2023-05-20 DIAGNOSIS — A419 Sepsis, unspecified organism: Secondary | ICD-10-CM | POA: Diagnosis present

## 2023-05-20 DIAGNOSIS — K219 Gastro-esophageal reflux disease without esophagitis: Secondary | ICD-10-CM | POA: Diagnosis present

## 2023-05-20 DIAGNOSIS — Z825 Family history of asthma and other chronic lower respiratory diseases: Secondary | ICD-10-CM

## 2023-05-20 DIAGNOSIS — Z808 Family history of malignant neoplasm of other organs or systems: Secondary | ICD-10-CM

## 2023-05-20 DIAGNOSIS — J9811 Atelectasis: Secondary | ICD-10-CM | POA: Diagnosis present

## 2023-05-20 DIAGNOSIS — D61810 Antineoplastic chemotherapy induced pancytopenia: Secondary | ICD-10-CM | POA: Diagnosis present

## 2023-05-20 DIAGNOSIS — F09 Unspecified mental disorder due to known physiological condition: Secondary | ICD-10-CM | POA: Diagnosis present

## 2023-05-20 DIAGNOSIS — Z800 Family history of malignant neoplasm of digestive organs: Secondary | ICD-10-CM

## 2023-05-20 DIAGNOSIS — C9000 Multiple myeloma not having achieved remission: Secondary | ICD-10-CM | POA: Diagnosis present

## 2023-05-20 DIAGNOSIS — K449 Diaphragmatic hernia without obstruction or gangrene: Secondary | ICD-10-CM | POA: Diagnosis present

## 2023-05-20 DIAGNOSIS — Z8711 Personal history of peptic ulcer disease: Secondary | ICD-10-CM

## 2023-05-20 DIAGNOSIS — R6520 Severe sepsis without septic shock: Secondary | ICD-10-CM | POA: Diagnosis present

## 2023-05-20 DIAGNOSIS — Z9481 Bone marrow transplant status: Secondary | ICD-10-CM

## 2023-05-20 DIAGNOSIS — D801 Nonfamilial hypogammaglobulinemia: Secondary | ICD-10-CM | POA: Diagnosis present

## 2023-05-20 DIAGNOSIS — G9341 Metabolic encephalopathy: Secondary | ICD-10-CM | POA: Diagnosis present

## 2023-05-20 DIAGNOSIS — Z8673 Personal history of transient ischemic attack (TIA), and cerebral infarction without residual deficits: Secondary | ICD-10-CM

## 2023-05-20 DIAGNOSIS — Z66 Do not resuscitate: Secondary | ICD-10-CM

## 2023-05-20 DIAGNOSIS — Z79899 Other long term (current) drug therapy: Secondary | ICD-10-CM

## 2023-05-20 DIAGNOSIS — N3000 Acute cystitis without hematuria: Secondary | ICD-10-CM | POA: Diagnosis present

## 2023-05-20 DIAGNOSIS — R4189 Other symptoms and signs involving cognitive functions and awareness: Secondary | ICD-10-CM | POA: Diagnosis present

## 2023-05-20 DIAGNOSIS — J690 Pneumonitis due to inhalation of food and vomit: Secondary | ICD-10-CM | POA: Diagnosis present

## 2023-05-20 DIAGNOSIS — N390 Urinary tract infection, site not specified: Secondary | ICD-10-CM | POA: Diagnosis not present

## 2023-05-20 DIAGNOSIS — E871 Hypo-osmolality and hyponatremia: Secondary | ICD-10-CM | POA: Diagnosis present

## 2023-05-20 DIAGNOSIS — E785 Hyperlipidemia, unspecified: Secondary | ICD-10-CM | POA: Diagnosis present

## 2023-05-20 DIAGNOSIS — Z1152 Encounter for screening for COVID-19: Secondary | ICD-10-CM

## 2023-05-20 DIAGNOSIS — D696 Thrombocytopenia, unspecified: Secondary | ICD-10-CM | POA: Diagnosis present

## 2023-05-20 DIAGNOSIS — Z882 Allergy status to sulfonamides status: Secondary | ICD-10-CM

## 2023-05-20 DIAGNOSIS — Z8249 Family history of ischemic heart disease and other diseases of the circulatory system: Secondary | ICD-10-CM

## 2023-05-20 DIAGNOSIS — I351 Nonrheumatic aortic (valve) insufficiency: Secondary | ICD-10-CM

## 2023-05-20 DIAGNOSIS — Z833 Family history of diabetes mellitus: Secondary | ICD-10-CM

## 2023-05-20 DIAGNOSIS — D539 Nutritional anemia, unspecified: Secondary | ICD-10-CM | POA: Diagnosis not present

## 2023-05-20 DIAGNOSIS — Z823 Family history of stroke: Secondary | ICD-10-CM

## 2023-05-20 DIAGNOSIS — R410 Disorientation, unspecified: Principal | ICD-10-CM

## 2023-05-20 DIAGNOSIS — Z87891 Personal history of nicotine dependence: Secondary | ICD-10-CM

## 2023-05-20 DIAGNOSIS — B965 Pseudomonas (aeruginosa) (mallei) (pseudomallei) as the cause of diseases classified elsewhere: Secondary | ICD-10-CM | POA: Diagnosis not present

## 2023-05-20 LAB — COMPREHENSIVE METABOLIC PANEL
ALT: 40 U/L (ref 0–44)
AST: 45 U/L — ABNORMAL HIGH (ref 15–41)
Albumin: 3.9 g/dL (ref 3.5–5.0)
Alkaline Phosphatase: 91 U/L (ref 38–126)
Anion gap: 12 (ref 5–15)
BUN: 15 mg/dL (ref 8–23)
CO2: 22 mmol/L (ref 22–32)
Calcium: 9.4 mg/dL (ref 8.9–10.3)
Chloride: 97 mmol/L — ABNORMAL LOW (ref 98–111)
Creatinine, Ser: 0.96 mg/dL (ref 0.61–1.24)
GFR, Estimated: >60 mL/min (ref >60)
Glucose, Bld: 107 mg/dL — ABNORMAL HIGH (ref 70–99)
Potassium: 3.9 mmol/L (ref 3.5–5.1)
Sodium: 131 mmol/L — ABNORMAL LOW (ref 135–145)
Total Bilirubin: 0.9 mg/dL (ref 0.3–1.2)
Total Protein: 6.7 g/dL (ref 6.5–8.1)

## 2023-05-20 LAB — URINALYSIS, ROUTINE W REFLEX MICROSCOPIC
Bilirubin Urine: NEGATIVE
Glucose, UA: NEGATIVE mg/dL
Ketones, ur: NEGATIVE mg/dL
Nitrite: NEGATIVE
Protein, ur: 30 mg/dL — AB
Specific Gravity, Urine: 1.013 (ref 1.005–1.030)
WBC, UA: >50 WBC/hpf (ref 0–5)
pH: 5 (ref 5.0–8.0)

## 2023-05-20 LAB — CBC WITH DIFFERENTIAL/PLATELET
Abs Immature Granulocytes: 1.18 10*3/uL — ABNORMAL HIGH (ref 0.00–0.07)
Basophils Absolute: 0 10*3/uL (ref 0.0–0.1)
Basophils Relative: 0 %
Eosinophils Absolute: 0 10*3/uL (ref 0.0–0.5)
Eosinophils Relative: 0 %
HCT: 33.6 % — ABNORMAL LOW (ref 39.0–52.0)
Hemoglobin: 11 g/dL — ABNORMAL LOW (ref 13.0–17.0)
Immature Granulocytes: 13 %
Lymphocytes Relative: 2 %
Lymphs Abs: 0.2 10*3/uL — ABNORMAL LOW (ref 0.7–4.0)
MCH: 37.3 pg — ABNORMAL HIGH (ref 26.0–34.0)
MCHC: 32.7 g/dL (ref 30.0–36.0)
MCV: 113.9 fL — ABNORMAL HIGH (ref 80.0–100.0)
Monocytes Absolute: 1.2 10*3/uL — ABNORMAL HIGH (ref 0.1–1.0)
Monocytes Relative: 14 %
Neutro Abs: 6.3 10*3/uL (ref 1.7–7.7)
Neutrophils Relative %: 71 %
Platelets: 40 10*3/uL — ABNORMAL LOW (ref 150–400)
RBC: 2.95 MIL/uL — ABNORMAL LOW (ref 4.22–5.81)
RDW: 20.2 % — ABNORMAL HIGH (ref 11.5–15.5)
WBC: 8.9 10*3/uL (ref 4.0–10.5)
nRBC: 0 % (ref 0.0–0.2)

## 2023-05-20 LAB — RESP PANEL BY RT-PCR (RSV, FLU A&B, COVID)  RVPGX2
Influenza A by PCR: NEGATIVE
Influenza B by PCR: NEGATIVE
Resp Syncytial Virus by PCR: NEGATIVE
SARS Coronavirus 2 by RT PCR: NEGATIVE

## 2023-05-20 LAB — BLOOD GAS, VENOUS
Acid-Base Excess: 1.7 mmol/L (ref 0.0–2.0)
Bicarbonate: 25.8 mmol/L (ref 20.0–28.0)
O2 Saturation: 86.7 %
Patient temperature: 37
pCO2, Ven: 38 mmHg — ABNORMAL LOW (ref 44–60)
pH, Ven: 7.44 — ABNORMAL HIGH (ref 7.25–7.43)
pO2, Ven: 48 mmHg — ABNORMAL HIGH (ref 32–45)

## 2023-05-20 LAB — LIPASE, BLOOD: Lipase: 26 U/L (ref 11–51)

## 2023-05-20 LAB — I-STAT CG4 LACTIC ACID, ED
Lactic Acid, Venous: 1.2 mmol/L (ref 0.5–1.9)
Lactic Acid, Venous: 1.7 mmol/L (ref 0.5–1.9)

## 2023-05-20 LAB — AMMONIA: Ammonia: 26 umol/L (ref 9–35)

## 2023-05-20 MED ORDER — LACTATED RINGERS IV BOLUS
1000.0000 mL | Freq: Once | INTRAVENOUS | Status: AC
  Administered 2023-05-20: 1000 mL via INTRAVENOUS

## 2023-05-20 MED ORDER — ACETAMINOPHEN 650 MG RE SUPP
650.0000 mg | RECTAL | Status: DC | PRN
  Administered 2023-05-20: 650 mg via RECTAL
  Filled 2023-05-20: qty 1

## 2023-05-20 MED ORDER — ALBUTEROL SULFATE (2.5 MG/3ML) 0.083% IN NEBU
10.0000 mg | INHALATION_SOLUTION | Freq: Once | RESPIRATORY_TRACT | Status: AC
  Administered 2023-05-20: 10 mg via RESPIRATORY_TRACT
  Filled 2023-05-20: qty 12

## 2023-05-20 MED ORDER — SODIUM CHLORIDE 0.9 % IV SOLN
2.0000 g | Freq: Once | INTRAVENOUS | Status: AC
  Administered 2023-05-20: 2 g via INTRAVENOUS
  Filled 2023-05-20: qty 12.5

## 2023-05-20 MED ORDER — POTASSIUM CHLORIDE IN NACL 40-0.9 MEQ/L-% IV SOLN
INTRAVENOUS | Status: AC
  Filled 2023-05-20 (×2): qty 1000

## 2023-05-20 MED ORDER — SODIUM CHLORIDE 0.9 % IV SOLN
2.0000 g | Freq: Three times a day (TID) | INTRAVENOUS | Status: DC
  Administered 2023-05-21 – 2023-05-22 (×4): 2 g via INTRAVENOUS
  Filled 2023-05-20 (×4): qty 12.5

## 2023-05-20 MED ORDER — VANCOMYCIN HCL 1500 MG/300ML IV SOLN
1500.0000 mg | INTRAVENOUS | Status: DC
  Administered 2023-05-21: 1500 mg via INTRAVENOUS
  Filled 2023-05-20: qty 300

## 2023-05-20 MED ORDER — PANTOPRAZOLE SODIUM 40 MG IV SOLR
40.0000 mg | INTRAVENOUS | Status: DC
  Administered 2023-05-20: 40 mg via INTRAVENOUS
  Filled 2023-05-20: qty 10

## 2023-05-20 MED ORDER — ONDANSETRON HCL 4 MG/2ML IJ SOLN
4.0000 mg | Freq: Four times a day (QID) | INTRAMUSCULAR | Status: DC | PRN
  Administered 2023-05-21: 4 mg via INTRAVENOUS
  Filled 2023-05-20: qty 2

## 2023-05-20 MED ORDER — IOHEXOL 350 MG/ML SOLN
75.0000 mL | Freq: Once | INTRAVENOUS | Status: AC | PRN
  Administered 2023-05-20: 75 mL via INTRAVENOUS

## 2023-05-20 MED ORDER — VANCOMYCIN HCL 1500 MG/300ML IV SOLN
1500.0000 mg | Freq: Once | INTRAVENOUS | Status: AC
  Administered 2023-05-20: 1500 mg via INTRAVENOUS
  Filled 2023-05-20: qty 300

## 2023-05-20 NOTE — Assessment & Plan Note (Addendum)
Stable. Repeat TSH. Was very low(0.015) in 02-2023.

## 2023-05-20 NOTE — Assessment & Plan Note (Addendum)
Chronic. Check MRI brain. If MRI shows embolic infarcts, he may need TEE.

## 2023-05-20 NOTE — Progress Notes (Signed)
/  Pharmacy Antibiotic Note   Andre [name] is a 72 y.o. male admitted on 05/20/2023 with sepsis.  Pharmacy has been consulted for vancomycin and cefepime dosing.  Patient was already given a loading dose of vancomycin 1500mg  on 9/1 @1739  as well as a dose of cefepime 2g on 9/1 @1739 .   Plan: - Start cefepime 2g IV q8hrs  - Start vancomycin 1500mg  IV q24hrs (eAUC 466 using Scr 0.96, IBW, and Vd 0.72) - Vancomycin levels as needed - Monitor cultures, renal function, and overall clinical picture - De-escalate antibiotics as able     Temp (24hrs), Avg:101.5 F (38.6 C), Min:100.3 F (37.9 C), Max:102.6 F (39.2 C)  Recent Labs  Lab 05/20/23 1631 05/20/23 1643 05/20/23 1846  WBC  --  8.9  --   CREATININE  --  0.96  --   LATICACIDVEN 1.2  --  1.7    Estimated Creatinine Clearance: 64.1 mL/min (by C-G formula based on SCr of 0.96 mg/dL).    Allergies  Allergen Reactions   Atorvastatin Other (See Comments)   Rosuvastatin     Other reaction(s): Liver Disorder   Crestor [Rosuvastatin Calcium]     ADVERSE EFFECTS ON LIVER   Lipitor [Atorvastatin Calcium]    Septra [Sulfamethoxazole-Trimethoprim] Rash    Antimicrobials this admission: 9/1 vancomycin >>  9/1 cefepime >>   Dose adjustments this admission: N/A  Microbiology results: 9/1 BCx: in process 9/1 UCx: not yet collected  9/1 resp panel: negative    Thank you for allowing pharmacy to be a part of this patient's care.  Cherylin Mylar, PharmD Clinical Pharmacist  9/1/20249:51 PM

## 2023-05-20 NOTE — Assessment & Plan Note (Addendum)
Admit to progressive bed. Continue with IVF. Continue with cefepime and vanco. Pt just finished treatment last week for pseudomonas septicemia. Check 20 pathogen RVP.

## 2023-05-20 NOTE — Assessment & Plan Note (Addendum)
Pt had CAR T-cell therapy in Jan 2024. Doubt this is cytokine release syndrome(CRS) or Immune effector cell-associated neurotoxicity syndrome (ICANS) since his treatment was in Jan 2024. Will add Dr. Truett Perna name to consult list. Hopefully he can see his patient on Tuesday.

## 2023-05-20 NOTE — Assessment & Plan Note (Signed)
Check B12 level, RBC folate.

## 2023-05-20 NOTE — Assessment & Plan Note (Signed)
Continue with IV cefepime and vanco. Blood cx and urine cx sent.

## 2023-05-20 NOTE — Subjective & Objective (Signed)
CC: hypoxia, fever HPI: 72 year old gentleman with a history of multiple myeloma status post multiple stem cell transplant and recent CAR-T therapy in January 2024, recent admission to the hospital for Pseudomonas septicemia, history of hypothyroidism, aortic insufficiency, who presents from [facility] nursing home for altered mental status, hypoxia and a fever.  Patient had been discharged to the facility on May 11, 2023 for rehab.  He was seen on 05/16/2023 for new onset hypoxia.  He was given a breathing treatment and supplemental oxygen.  Patient's wife provides history as the patient is unable to give any review of systems due to his metabolic encephalopathy.  Wife states that she is on yesterday and he was talking normally.  He was somewhat tired but still interactive.  Today, when she went to see him, he was completely obtunded.  He was sent to the ER for evaluation.  By EMS, he had room air saturations of 83%.  He was placed on a nonrebreather.  On arrival to the ER temp 102.6, heart rate 112, blood pressure 148/87.  Clean-catch UA showed specific gravity 1.013 moderate hemoglobin large leukocyte esterase negative nitrates greater than 50 WBCs rare bacteria.  Lactic acid normal at 1.2  Sodium 131, potassium 3.9, chloride of 97, bicarb 22, BUN 15, creatinine 0.96, glucose of 107  AST 45, ALT 40, alk phos of 91, total bili 0.9  Lipase normal at 26  White count 8.9, hemoglobin 11.0, platelets of 40,000  Ammonia 26  COVID-negative, influenza negative, RSV negative  Chest x-ray showed large hiatal hernia.  There is bibasilar atelectasis.  CT head demonstrates no acute intracranial abnormalities.  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  CTPA negative for PE.  There is bibasilar atelectasis.  No evidence of pneumonia.  EKG my interpretation shows sinus tachycardia.  [facility] hospital[facility] consulted.

## 2023-05-20 NOTE — ED Triage Notes (Signed)
Pt arrived via EMS, from [facility]. Earlier today, was at baseline, found altered prior to arrival. Unable to answer questions. Responsive to pain.  Was given tylenol for fever of 99.1 at NH   83% on RA  On NRB 15L 93%    Baseline- confusion but able to speak and answer questions.

## 2023-05-20 NOTE — Assessment & Plan Note (Signed)
Chronic. Recently improving. SCD for DVT prophylaxis.

## 2023-05-20 NOTE — H&P (Signed)
History and Physical    Andre [name] ZOX:[id number] DOB: [birth year] DOA: 05/20/2023  DOS: the patient was seen and examined on 05/20/2023  PCP: [name], MD   Patient coming from: SNF. [facility]  I have personally briefly reviewed patient's old medical records in [facility]  CC: hypoxia, fever HPI: 72 year old gentleman with a history of multiple myeloma status post multiple stem cell transplant and recent CAR-T therapy in January 2024, recent admission to the hospital for Pseudomonas septicemia, history of hypothyroidism, aortic insufficiency, who presents from [facility] nursing home for altered mental status, hypoxia and a fever.  Patient had been discharged to the facility on May 11, 2023 for rehab.  He was seen on 05/16/2023 for new onset hypoxia.  He was given a breathing treatment and supplemental oxygen.  Patient's wife provides history as the patient is unable to give any review of systems due to his metabolic encephalopathy.  Wife states that she is on yesterday and he was talking normally.  He was somewhat tired but still interactive.  Today, when she went to see him, he was completely obtunded.  He was sent to the ER for evaluation.  By EMS, he had room air saturations of 83%.  He was placed on a nonrebreather.  On arrival to the ER temp 102.6, heart rate 112, blood pressure 148/87.  Clean-catch UA showed specific gravity 1.013 moderate hemoglobin large leukocyte esterase negative nitrates greater than 50 WBCs rare bacteria.  Lactic acid normal at 1.2  Sodium 131, potassium 3.9, chloride of 97, bicarb 22, BUN 15, creatinine 0.96, glucose of 107  AST 45, ALT 40, alk phos of 91, total bili 0.9  Lipase normal at 26  White count 8.9, hemoglobin 11.0, platelets of 40,000  Ammonia 26  COVID-negative, influenza negative, RSV negative  Chest x-ray showed large hiatal hernia.  There is bibasilar atelectasis.  CT head demonstrates no acute intracranial  abnormalities.  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  CTPA negative for PE.  There is bibasilar atelectasis.  No evidence of pneumonia.  EKG my interpretation shows sinus tachycardia.  [facility] hospitalist consulted.   ED Course: cefepime/vanco given. CTPA negative for PE or pneumonia.  Review of Systems:  Review of Systems  Unable to perform ROS: Mental status change  Acute encephalopathy  Past Medical History:  Diagnosis Date   Allergy    Anemia    Arthritis    Asthma    no treatment x 20 years   Depression    Double vision    occurs at times    Duodenal ulcer    GERD (gastroesophageal reflux disease)    Hemorrhage of rectum and anus 09/15/2013   Hyperlipidemia    Hypertension    Hypothyroidism    Leukopenia due to antineoplastic chemotherapy (HCC) 02/22/2017   Multiple myeloma 07/04/2011   Thyroid disease    TIA (transient ischemic attack) 03/27/2012   Transient diplopia 03/27/2012   Transient global amnesia 06/07/2015    Past Surgical History:  Procedure Laterality Date   BONE MARROW TRANSPLANT  2011   for MM   CARDIOLITE STUDY  11/25/2003   NORMAL   TONSILLECTOMY       reports that he quit smoking about 54 years ago. His smoking use included cigarettes. He started smoking about 57 years ago. He has never used smokeless tobacco. He reports that he does not drink alcohol and does not use drugs.  Allergies  Allergen Reactions   Atorvastatin Other (See Comments)  Rosuvastatin     Other reaction(s): Liver Disorder   Crestor [Rosuvastatin Calcium]     ADVERSE EFFECTS ON LIVER   Lipitor [Atorvastatin Calcium]    Septra [Sulfamethoxazole-Trimethoprim] Rash    Family History  Problem Relation Age of Onset   Throat cancer Mother    Hypertension Father    Stroke Father    Asthma Father    Diabetes Father    Pancreatic cancer Brother     Prior to Admission medications   Medication Sig Start Date End Date Taking? Authorizing Provider  acetaminophen  (TYLENOL) 650 MG CR tablet Take 650 mg by mouth every 6 (six) hours as needed for pain.   Yes [provider]  calcium carbonate (TUMS EX) 750 MG chewable tablet Chew 2 tablets by mouth 3 (three) times daily as needed for heartburn.   Yes [provider]  calcium-vitamin D (OSCAL WITH D) 500-200 MG-UNIT tablet Take 1 tablet by mouth daily. 08/14/22  Yes [name], Georgina Quint, MD  Cholecalciferol (VITAMIN D3) 25 MCG (1000 UT) capsule Take 1,000 Units by mouth daily.   Yes [provider]  cyanocobalamin (VITAMIN B12) 1000 MCG tablet Take 1 tablet (1,000 mcg total) by mouth daily. 04/09/23  Yes Rana Snare, NP  dapsone 100 MG tablet Take 100 mg by mouth daily.   Yes Ladene Artist, MD  docusate sodium (COLACE) 100 MG capsule Take 100 mg by mouth daily as needed for mild constipation or moderate constipation.   Yes [provider]  escitalopram (LEXAPRO) 10 MG tablet Take 1 tablet (10 mg total) by mouth daily. 06/27/22  Yes [name], Georgina Quint, MD  ferrous sulfate 325 (65 FE) MG EC tablet Take 325 mg by mouth 2 (two) times daily.   Yes [provider]  folic acid (FOLVITE) 1 MG tablet Take 1 tablet (1 mg total) by mouth daily. 04/09/23  Yes Rana Snare, NP  ipratropium-albuterol (DUONEB) 0.5-2.5 (3) MG/3ML SOLN Take 3 mLs by nebulization 2 (two) times daily as needed for up to 14 days. 05/17/23 05/31/23 Yes Fargo, Amy E, NP  levothyroxine (SYNTHROID) 125 MCG tablet Take 125 mcg by mouth daily. 04/12/23  Yes [provider]  methylphenidate (RITALIN) 5 MG tablet Take 0.5 tablets (2.5 mg total) by mouth daily. Each morning 05/11/23 06/10/23 Yes Pahwani, Daleen Bo, MD  pantoprazole (PROTONIX) 40 MG tablet Take 1 tablet (40 mg total) by mouth daily. 04/20/23  Yes Wert, Trula Ore, NP  valACYclovir (VALTREX) 500 MG tablet Take 500 mg by mouth daily.   Yes [provider]  prochlorperazine (COMPAZINE) 10 MG tablet Take 1 tablet (10 mg total) by mouth  every 6 (six) hours as needed (Nausea or vomiting). 12/14/20 12/12/21  Magrinat, Valentino Hue, MD    Physical Exam: Vitals:   05/20/23 1731 05/20/23 1800 05/20/23 1900 05/20/23 1931  BP:  (!) 149/87  133/77  Pulse:  (!) 106  (!) 118  Resp:  (!) 27 (!) 29 18  Temp:    100.3 F (37.9 C)  TempSrc:    Axillary  SpO2: 91% 95%  91%    Physical Exam Vitals and nursing note reviewed.  Constitutional:      Comments: encephalopathic  HENT:     Head: Normocephalic and atraumatic.  Eyes:     Pupils: Pupils are equal, round, and reactive to light.  Cardiovascular:     Rate and Rhythm: Normal rate and regular rhythm.     Heart sounds: Murmur heard.  Comments: 3/6 holosystolic murmur LLSB Pulmonary:     Effort: Pulmonary effort is normal. No respiratory distress.  Abdominal:     General: Bowel sounds are normal. There is no distension.     Tenderness: There is no abdominal tenderness.  Musculoskeletal:     Right lower leg: No edema.     Left lower leg: No edema.     Comments: Wife states that pt's normal swelling in his ankles/feet is not present today  Skin:    General: Skin is warm and dry.     Capillary Refill: Capillary refill takes less than 2 seconds.  Neurological:     Comments: encephalopathic      Labs on Admission: I have personally reviewed following labs and imaging studies  CBC: Recent Labs  Lab 05/20/23 1643  WBC 8.9  NEUTROABS 6.3  HGB 11.0*  HCT 33.6*  MCV 113.9*  PLT 40*   Basic Metabolic Panel: Recent Labs  Lab 05/20/23 1643  NA 131*  K 3.9  CL 97*  CO2 22  GLUCOSE 107*  BUN 15  CREATININE 0.96  CALCIUM 9.4   GFR: Estimated Creatinine Clearance: 64.1 mL/min (by C-G formula based on SCr of 0.96 mg/dL). Liver Function Tests: Recent Labs  Lab 05/20/23 1643  AST 45*  ALT 40  ALKPHOS 91  BILITOT 0.9  PROT 6.7  ALBUMIN 3.9   Recent Labs  Lab 05/20/23 1643  LIPASE 26   Recent Labs  Lab 05/20/23 1825  AMMONIA 26   BNP (last 3  results) Recent Labs    05/05/23 1744  BNP 116.7*   Urine analysis:    Component Value Date/Time   COLORURINE YELLOW 05/20/2023 1648   APPEARANCEUR HAZY (A) 05/20/2023 1648   APPEARANCEUR Clear 12/22/2015 1614   LABSPEC 1.013 05/20/2023 1648   PHURINE 5.0 05/20/2023 1648   GLUCOSEU NEGATIVE 05/20/2023 1648   GLUCOSEU NEGATIVE 09/06/2017 1040   HGBUR MODERATE (A) 05/20/2023 1648   BILIRUBINUR NEGATIVE 05/20/2023 1648   BILIRUBINUR Negative 12/22/2015 1614   KETONESUR NEGATIVE 05/20/2023 1648   PROTEINUR 30 (A) 05/20/2023 1648   UROBILINOGEN 0.2 09/06/2017 1040   NITRITE NEGATIVE 05/20/2023 1648   LEUKOCYTESUR LARGE (A) 05/20/2023 1648    Radiological Exams on Admission: I have personally reviewed images CT Angio Chest PE W and/or Wo Contrast  Result Date: 05/20/2023 CLINICAL DATA:  Pulmonary embolism (PE) suspected, high prob. Found altered. Responsive to pain. EXAM: CT ANGIOGRAPHY CHEST WITH CONTRAST TECHNIQUE: Multidetector CT imaging of the chest was performed using the standard protocol during bolus administration of intravenous contrast. Multiplanar CT image reconstructions and MIPs were obtained to evaluate the vascular anatomy. RADIATION DOSE REDUCTION: This exam was performed according to the departmental dose-optimization program which includes automated exposure control, adjustment of the mA and/or kV according to patient size and/or use of iterative reconstruction technique. CONTRAST:  75mL OMNIPAQUE IOHEXOL 350 MG/ML SOLN COMPARISON:  05/05/2023 FINDINGS: Cardiovascular: No filling defects in the pulmonary arteries to suggest pulmonary emboli. Heart is enlarged. Coronary artery and aortic atherosclerosis. No aneurysm. Mediastinum/Nodes: No mediastinal, hilar, or axillary adenopathy. Trachea and esophagus are unremarkable. Thyroid unremarkable. Large hiatal hernia. Lungs/Pleura: Elevation and eventration of the right hemidiaphragm. Bibasilar atelectasis. No effusions. Upper  Abdomen: No acute findings. Small layering gallstone within the gallbladder. Musculoskeletal: Chest wall soft tissues are unremarkable. Multiple old healing bilateral rib fractures. Chronic mild T12 compression fracture, stable. Chronic patchy lucencies throughout the osseous structures, unchanged. Review of the MIP images confirms the above findings. IMPRESSION:  No evidence of pulmonary embolus. Cardiomegaly, coronary artery disease. Bibasilar atelectasis. Large hiatal hernia. Stable chronic patchy lucencies throughout the osseous structures likely related to multiple myeloma. No acute bony abnormality. Aortic Atherosclerosis (ICD10-I70.0). Electronically Signed   By: Charlett Nose M.D.   On: 05/20/2023 20:16   CT Head Wo Contrast  Result Date: 05/20/2023 CLINICAL DATA:  Found altered prior to arrival. Mental status change. Responsive to pain. EXAM: CT HEAD WITHOUT CONTRAST TECHNIQUE: Contiguous axial images were obtained from the base of the skull through the vertex without intravenous contrast. RADIATION DOSE REDUCTION: This exam was performed according to the departmental dose-optimization program which includes automated exposure control, adjustment of the mA and/or kV according to patient size and/or use of iterative reconstruction technique. COMPARISON:  CT head 04/20/2023 FINDINGS: Brain: No intracranial hemorrhage, mass effect, or evidence of acute infarct. No hydrocephalus. No extra-axial fluid collection. Age-commensurate cerebral atrophy and ill-defined hypoattenuation within the cerebral white matter consistent with chronic small vessel ischemic disease. Chronic left cerebellar infarct. Vascular: No hyperdense vessel. Intracranial arterial calcification. Skull: No fracture. Multiple lucencies are present throughout the calvarium, similar to prior Sinuses/Orbits: No acute finding. Paranasal sinuses and mastoid air cells are well aerated. Other: None. IMPRESSION: 1. Unchanged CT head from 04/20/2023. No  acute intracranial abnormality. Electronically Signed   By: Minerva Fester M.D.   On: 05/20/2023 17:32   DG Chest Portable 1 View  Result Date: 05/20/2023 CLINICAL DATA:  Hypoxia. Concern for pneumonia. Earlier today was at baseline but found altered prior to arrival. EXAM: PORTABLE CHEST 1 VIEW COMPARISON:  Chest radiographs 05/06/2023, 05/05/2023, 01/11/2023; CT chest, abdomen, and pelvis 05/05/2023 FINDINGS: There is again moderate elevation of the right hemidiaphragm. Cardiac silhouette is at the upper limits of normal size for AP technique. Retrocardiac opacity is seen corresponding to the known large hiatal hernia better seen on prior CT. Mild bibasilar linear subsegmental atelectasis is similar to prior. No pneumothorax. Old healed posterior right rib fractures. IMPRESSION: 1. Mild bibasilar linear subsegmental atelectasis is similar to prior. 2. Large hiatal hernia. Electronically Signed   By: Neita Garnet M.D.   On: 05/20/2023 17:32    EKG: My personal interpretation of EKG shows: sinus tachycardia    Assessment/Plan Principal Problem:   Sepsis without acute organ dysfunction (HCC) Active Problems:   Acute cystitis without hematuria   Acute metabolic encephalopathy   Hypothyroidism   Thrombocytopenia (HCC)   Multiple myeloma not having achieved remission (HCC)   GERD (gastroesophageal reflux disease)   Acute hypoxemic respiratory failure (HCC)   DNR (do not resuscitate)/DNI(Do Not Intubate)   Moderate-to-severe aortic regurgitation   Macrocytic anemia    Assessment and Plan: * Sepsis without acute organ dysfunction (HCC) Admit to progressive bed. Continue with IVF. Continue with cefepime and vanco. Pt just finished treatment last week for pseudomonas septicemia. Check 20 pathogen RVP.  Acute metabolic encephalopathy Unclear the cause of his encephalopathy. His metabolic derangements are not that severe. Will check MRI brain to evaluate for CVA.  Keep pt NPO for now. He is not  safe to eat or drink.  Acute cystitis without hematuria Continue with IV cefepime and vanco. Blood cx and urine cx sent.  Moderate-to-severe aortic regurgitation Chronic. Check MRI brain. If MRI shows embolic infarcts, he may need TEE.  DNR (do not resuscitate)/DNI(Do Not Intubate) Verified DNR/DNI status with pt's wife    Acute hypoxemic respiratory failure (HCC) Unclear the cause of his hypoxia. Pt has been on supplemental O2 for 3 days  now. [facility]ed at [facility]. CTPA negative for PE or pneumonia. Pt not normally on supplemental O2.  GERD (gastroesophageal reflux disease) Add IV protonix.  Multiple myeloma not having achieved remission (HCC) Pt had CAR T-cell therapy in Jan 2024. Doubt this is cytokine release syndrome(CRS) or Immune effector cell-associated neurotoxicity syndrome (ICANS) since his treatment was in Jan 2024. Will add Dr. Truett Perna name to consult list. Hopefully he can see his patient on Tuesday.  Thrombocytopenia (HCC) Chronic. Recently improving. SCD for DVT prophylaxis.  Hypothyroidism Stable. Repeat TSH. Was very low(0.015) in 02-2023.  Macrocytic anemia Check B12 level, RBC folate.   DVT prophylaxis: SCDs Code Status: DNR/DNI(Do NOT Intubate). Verified DNR/DNI status with pt's wife Andre [name] Family Communication: discussed with pt's wife Andre [name]  Disposition Plan: return to SNF  Consults called: none  Admission status: Inpatient,  progressive   Carollee Herter, DO [facility] 05/20/2023, 9:46 PM

## 2023-05-20 NOTE — Assessment & Plan Note (Signed)
Verified DNR/DNI status with pt's wife

## 2023-05-20 NOTE — Progress Notes (Signed)
A consult was received from an ED physician for Vancomycin and Cefepime per pharmacy dosing.  [name] profile has been reviewed for ht/wt/allergies/indication/available labs.    A one time order has been placed for: - Vancomycin 1.5gm IV x 1 dose. - Cefepime 2gm IV x 1 dose.  Further antibiotics/pharmacy consults should be ordered by admitting physician if indicated.                       Thank you, Josefa Half 05/20/2023  4:41 PM

## 2023-05-20 NOTE — Assessment & Plan Note (Addendum)
Unclear the cause of his encephalopathy. His metabolic derangements are not that severe. Will check MRI brain to evaluate for CVA.  Keep pt NPO for now. He is not safe to eat or drink.

## 2023-05-20 NOTE — ED Provider Notes (Signed)
[name] Provider Note   CSN: [id number] Arrival date & time: 05/20/23  1551     History  Chief Complaint  Patient presents with   Altered Mental Status    Andre RODRIQUES is a 72 y.o. male.  This is a 72 year old male presenting emergency department from skilled nursing facility reportedly somnolent/altered he is febrile tachycardic.  EMS reported hypoxia.  See ED course for further HPI   Altered Mental Status      Home Medications Prior to Admission medications   Medication Sig Start Date End Date Taking? Authorizing Provider  acetaminophen (TYLENOL) 650 MG CR tablet Take 650 mg by mouth every 6 (six) hours as needed for pain.   Yes [provider]  calcium carbonate (TUMS EX) 750 MG chewable tablet Chew 2 tablets by mouth 3 (three) times daily as needed for heartburn.   Yes [provider]  calcium-vitamin D (OSCAL WITH D) 500-200 MG-UNIT tablet Take 1 tablet by mouth daily. 08/14/22  Yes [name], Georgina Quint, MD  Cholecalciferol (VITAMIN D3) 25 MCG (1000 UT) capsule Take 1,000 Units by mouth daily.   Yes [provider]  cyanocobalamin (VITAMIN B12) 1000 MCG tablet Take 1 tablet (1,000 mcg total) by mouth daily. 04/09/23  Yes Rana Snare, NP  dapsone 100 MG tablet Take 100 mg by mouth daily.   Yes Ladene Artist, MD  docusate sodium (COLACE) 100 MG capsule Take 100 mg by mouth daily as needed for mild constipation or moderate constipation.   Yes [provider]  escitalopram (LEXAPRO) 10 MG tablet Take 1 tablet (10 mg total) by mouth daily. 06/27/22  Yes [name], Georgina Quint, MD  ferrous sulfate 325 (65 FE) MG EC tablet Take 325 mg by mouth 2 (two) times daily.   Yes [provider]  folic acid (FOLVITE) 1 MG tablet Take 1 tablet (1 mg total) by mouth daily. 04/09/23  Yes Rana Snare, NP  ipratropium-albuterol (DUONEB) 0.5-2.5 (3) MG/3ML SOLN Take 3 mLs by nebulization 2 (two)  times daily as needed for up to 14 days. 05/17/23 05/31/23 Yes Fargo, Amy E, NP  levothyroxine (SYNTHROID) 125 MCG tablet Take 125 mcg by mouth daily. 04/12/23  Yes [provider]  methylphenidate (RITALIN) 5 MG tablet Take 0.5 tablets (2.5 mg total) by mouth daily. Each morning 05/11/23 06/10/23 Yes Pahwani, Daleen Bo, MD  pantoprazole (PROTONIX) 40 MG tablet Take 1 tablet (40 mg total) by mouth daily. 04/20/23  Yes Wert, Trula Ore, NP  valACYclovir (VALTREX) 500 MG tablet Take 500 mg by mouth daily.   Yes [provider]  prochlorperazine (COMPAZINE) 10 MG tablet Take 1 tablet (10 mg total) by mouth every 6 (six) hours as needed (Nausea or vomiting). 12/14/20 12/12/21  Magrinat, Valentino Hue, MD      Allergies    Atorvastatin, Rosuvastatin, Crestor [rosuvastatin calcium], Lipitor [atorvastatin calcium], and Septra [sulfamethoxazole-trimethoprim]    Review of Systems   Review of Systems  Physical Exam Updated Vital Signs BP (!) 148/81   Pulse (!) 114   Temp (!) 103.1 F (39.5 C) (Rectal)   Resp (!) 22   SpO2 93%  Physical Exam Vitals and nursing note reviewed.  Constitutional:      Comments: Somnolent. Will somewhat move to touch.   HENT:     Head: Normocephalic and atraumatic.     Nose: Nose normal.     Mouth/Throat:     Mouth: Mucous membranes are moist.  Eyes:  Pupils: Pupils are equal, round, and reactive to light.  Cardiovascular:     Rate and Rhythm: Regular rhythm. Tachycardia present.  Pulmonary:     Effort: Pulmonary effort is normal.     Comments: Patient 92 to 93% on nonrebreather.  Does not appear to be in overt respiratory distress with labored breathing.  Does have some coarse breath sounds throughout all lung fields.  Some faint wheezing Abdominal:     General: Abdomen is flat. There is no distension.     Palpations: Abdomen is soft.     Tenderness: There is no abdominal tenderness.  Genitourinary:    Penis: Normal.      Testes: Normal.     Comments:  No sacral decubitus Musculoskeletal:        General: No deformity.  Skin:    General: Skin is warm and dry.     Findings: No erythema or rash.  Neurological:     Comments: Exam limited by patient's mentation.  Will draw extremities with noxious stimuli.     ED Results / Procedures / Treatments   Labs (all labs ordered are listed, but only abnormal results are displayed) Labs Reviewed  CBC WITH DIFFERENTIAL/PLATELET - Abnormal; Notable for the following components:      Result Value   RBC 2.95 (*)    Hemoglobin 11.0 (*)    HCT 33.6 (*)    MCV 113.9 (*)    MCH 37.3 (*)    RDW 20.2 (*)    Platelets 40 (*)    Lymphs Abs 0.2 (*)    Monocytes Absolute 1.2 (*)    Abs Immature Granulocytes 1.18 (*)    All other components within normal limits  COMPREHENSIVE METABOLIC PANEL - Abnormal; Notable for the following components:   Sodium 131 (*)    Chloride 97 (*)    Glucose, Bld 107 (*)    AST 45 (*)    All other components within normal limits  URINALYSIS, ROUTINE W REFLEX MICROSCOPIC - Abnormal; Notable for the following components:   APPearance HAZY (*)    Hgb urine dipstick MODERATE (*)    Protein, ur 30 (*)    Leukocytes,Ua LARGE (*)    Bacteria, UA RARE (*)    All other components within normal limits  BLOOD GAS, VENOUS - Abnormal; Notable for the following components:   pH, Ven 7.44 (*)    pCO2, Ven 38 (*)    pO2, Ven 48 (*)    All other components within normal limits  RESP PANEL BY RT-PCR (RSV, FLU A&B, COVID)  RVPGX2  CULTURE, BLOOD (ROUTINE X 2)  CULTURE, BLOOD (ROUTINE X 2)  RESPIRATORY PANEL BY PCR  URINE CULTURE  LIPASE, BLOOD  AMMONIA  PROCALCITONIN  FOLATE  VITAMIN B12  TSH  COMPREHENSIVE METABOLIC PANEL  CBC WITH DIFFERENTIAL/PLATELET  MAGNESIUM  I-STAT CG4 LACTIC ACID, ED  I-STAT CG4 LACTIC ACID, ED    EKG EKG Interpretation Date/Time:  Sunday May 20 2023 16:04:00 EDT Ventricular Rate:  111 PR Interval:  183 QRS Duration:  97 QT  Interval:  312 QTC Calculation: 424 R Axis:   -69  Text Interpretation: Sinus tachycardia Ventricular premature complex Left anterior fascicular block Abnormal R-wave progression, late transition Confirmed by Estanislado Pandy [phone]) on 05/20/2023 5:41:56 PM  Radiology CT Angio Chest PE W and/or Wo Contrast  Result Date: 05/20/2023 CLINICAL DATA:  Pulmonary embolism (PE) suspected, high prob. Found altered. Responsive to pain. EXAM: CT ANGIOGRAPHY CHEST WITH CONTRAST TECHNIQUE: Multidetector CT  imaging of the chest was performed using the standard protocol during bolus administration of intravenous contrast. Multiplanar CT image reconstructions and MIPs were obtained to evaluate the vascular anatomy. RADIATION DOSE REDUCTION: This exam was performed according to the departmental dose-optimization program which includes automated exposure control, adjustment of the mA and/or kV according to patient size and/or use of iterative reconstruction technique. CONTRAST:  75mL OMNIPAQUE IOHEXOL 350 MG/ML SOLN COMPARISON:  05/05/2023 FINDINGS: Cardiovascular: No filling defects in the pulmonary arteries to suggest pulmonary emboli. Heart is enlarged. Coronary artery and aortic atherosclerosis. No aneurysm. Mediastinum/Nodes: No mediastinal, hilar, or axillary adenopathy. Trachea and esophagus are unremarkable. Thyroid unremarkable. Large hiatal hernia. Lungs/Pleura: Elevation and eventration of the right hemidiaphragm. Bibasilar atelectasis. No effusions. Upper Abdomen: No acute findings. Small layering gallstone within the gallbladder. Musculoskeletal: Chest wall soft tissues are unremarkable. Multiple old healing bilateral rib fractures. Chronic mild T12 compression fracture, stable. Chronic patchy lucencies throughout the osseous structures, unchanged. Review of the MIP images confirms the above findings. IMPRESSION: No evidence of pulmonary embolus. Cardiomegaly, coronary artery disease. Bibasilar atelectasis. Large  hiatal hernia. Stable chronic patchy lucencies throughout the osseous structures likely related to multiple myeloma. No acute bony abnormality. Aortic Atherosclerosis (ICD10-I70.0). Electronically Signed   By: Charlett Nose M.D.   On: 05/20/2023 20:16   CT Head Wo Contrast  Result Date: 05/20/2023 CLINICAL DATA:  Found altered prior to arrival. Mental status change. Responsive to pain. EXAM: CT HEAD WITHOUT CONTRAST TECHNIQUE: Contiguous axial images were obtained from the base of the skull through the vertex without intravenous contrast. RADIATION DOSE REDUCTION: This exam was performed according to the departmental dose-optimization program which includes automated exposure control, adjustment of the mA and/or kV according to patient size and/or use of iterative reconstruction technique. COMPARISON:  CT head 04/20/2023 FINDINGS: Brain: No intracranial hemorrhage, mass effect, or evidence of acute infarct. No hydrocephalus. No extra-axial fluid collection. Age-commensurate cerebral atrophy and ill-defined hypoattenuation within the cerebral white matter consistent with chronic small vessel ischemic disease. Chronic left cerebellar infarct. Vascular: No hyperdense vessel. Intracranial arterial calcification. Skull: No fracture. Multiple lucencies are present throughout the calvarium, similar to prior Sinuses/Orbits: No acute finding. Paranasal sinuses and mastoid air cells are well aerated. Other: None. IMPRESSION: 1. Unchanged CT head from 04/20/2023. No acute intracranial abnormality. Electronically Signed   By: Minerva Fester M.D.   On: 05/20/2023 17:32   DG Chest Portable 1 View  Result Date: 05/20/2023 CLINICAL DATA:  Hypoxia. Concern for pneumonia. Earlier today was at baseline but found altered prior to arrival. EXAM: PORTABLE CHEST 1 VIEW COMPARISON:  Chest radiographs 05/06/2023, 05/05/2023, 01/11/2023; CT chest, abdomen, and pelvis 05/05/2023 FINDINGS: There is again moderate elevation of the right  hemidiaphragm. Cardiac silhouette is at the upper limits of normal size for AP technique. Retrocardiac opacity is seen corresponding to the known large hiatal hernia better seen on prior CT. Mild bibasilar linear subsegmental atelectasis is similar to prior. No pneumothorax. Old healed posterior right rib fractures. IMPRESSION: 1. Mild bibasilar linear subsegmental atelectasis is similar to prior. 2. Large hiatal hernia. Electronically Signed   By: Neita Garnet M.D.   On: 05/20/2023 17:32    Procedures .Critical Care  Performed by: Coral Spikes, DO Authorized by: Coral Spikes, DO   Critical care provider statement:    Critical care time (minutes):  30   Critical care was necessary to treat or prevent imminent or life-threatening deterioration of the following conditions:  Sepsis and respiratory failure  Critical care was time spent personally by me on the following activities:  Discussions with consultants, examination of patient, obtaining history from patient or surrogate, ordering and review of laboratory studies, pulse oximetry, re-evaluation of patient's condition and review of old charts   I assumed direction of critical care for this patient from another provider in my specialty: yes     Care discussed with: admitting provider       Medications Ordered in ED Medications  pantoprazole (PROTONIX) injection 40 mg (has no administration in time range)  0.9 % NaCl with KCl 40 mEq / L  infusion (has no administration in time range)  acetaminophen (TYLENOL) suppository 650 mg (650 mg Rectal Given 05/20/23 2221)  ondansetron (ZOFRAN) injection 4 mg (has no administration in time range)  vancomycin (VANCOREADY) IVPB 1500 mg/300 mL (has no administration in time range)  ceFEPIme (MAXIPIME) 2 g in sodium chloride 0.9 % 100 mL IVPB (has no administration in time range)  lactated ringers bolus 1,000 mL (0 mLs Intravenous Stopped 05/20/23 2150)  albuterol (PROVENTIL) (2.5 MG/3ML) 0.083%  nebulizer solution 10 mg (10 mg Nebulization Given 05/20/23 1731)  ceFEPIme (MAXIPIME) 2 g in sodium chloride 0.9 % 100 mL IVPB (0 g Intravenous Stopped 05/20/23 1809)  vancomycin (VANCOREADY) IVPB 1500 mg/300 mL (0 mg Intravenous Stopped 05/20/23 1934)  iohexol (OMNIPAQUE) 350 MG/ML injection 75 mL (75 mLs Intravenous Contrast Given 05/20/23 1954)    ED Course/ Medical Decision Making/ A&P Clinical Course as of 05/20/23 2308  Sun May 20, 2023  1617 Presented from facility with baseline confusion, but alert and cognizant.  Patient is obtunded, will open eyes did not answer questions or follow commands.  He is hypoxic with oxygen saturation to the mid 80s per EMS.  Arrived on nonrebreather saturating in the 93%.  Does not appear to be in overt respiratory distress.  Lungs with some faint wheezing, coarse breath sounds.  He is febrile tachycardic.  Per chart review appears to recently been admitted for bacteremia with Pseudomonas and aspiration pneumonia/pneumonitis.  Patient has no overt external sources of infection.  IV fluids ordered.  Vanco cefepime ordered given his history of Pseudomonas.  Will give breathing treatment as well.  Anticipate admission. Of note, patient has DNR form.  [TY]  1705 Urinalysis, Routine w reflex microscopic -Urine, Clean Catch(!) Possible source of infection. Antibiotics ordered  [TY]  1705 Lactic Acid, Venous: 1.2 Re-assuring [TY]  1720 Reevaluated patient, continues to saturate 93% on nonrebreather.  Breathing nonlabored.  Wife has not bedside reports patient's baseline is typically awake and alert with some mild confusion.  She reports at facility patient has not been saturating well for the past couple days and they have tried supplemental oxygen with nasal cannula.  Reportedly he was normal last night and was quite sleepy this morning per staff at facility [TY]  1722 Comprehensive metabolic panel(!) Mild hyponatremia. normal kidney function.  Mild elevation in his AST.   No significant metabolic derangements that would explain his altered mentation today. [TY]  1741 DG Chest Portable 1 View IMPRESSION: 1. Mild bibasilar linear subsegmental atelectasis is similar to prior. 2. Large hiatal hernia.   [TY]  1756 CBC with Differential(!) No leukocytosis.  Stable anemia.  Thrombocytopenia noted [TY]  1756 Lipase: 26 Acute pancreatitis unlikely [TY]  1925 Patient continues to be somnolent.  Saturating 90%  mental status after breathing treatment although there is still some manipulation going.  Will trial high flow nasal cannula.  [TY]  2258 Patient  saturating in the low 90s on high flow nasal cannula.  Lab workup largely reassuring with no leukocytosis or elevated lactate.  Does have evidence of UTI.  CTA appears to have left lower lobe pneumonia on my independent interpretation.  CT head negative.  Given patient's oxygen requirements, and encephalopathy and  havingfor  concerning vitals for SIRS/sepsis will admit for further treatment. [TY]    Clinical Course User Index [TY] Coral Spikes, DO                                 Medical Decision Making See ED course for full HPI and MDM.   Amount and/or Complexity of Data Reviewed Independent Historian: spouse    Details: See ED course External Data Reviewed:     Details: Recent admission for encephalopathy, bacteremia and aspiration pneumonia. Labs: ordered. Decision-making details documented in ED Course. Radiology: ordered and independent interpretation performed. Decision-making details documented in ED Course.    Details: See ED course  Risk Prescription drug management. Decision regarding hospitalization.          Final Clinical Impression(s) / ED Diagnoses Final diagnoses:  Delirium  Sepsis, due to unspecified organism, unspecified whether acute organ dysfunction present [facility])    Rx / DC Orders ED Discharge Orders     None         Coral Spikes, DO 05/20/23 2308

## 2023-05-20 NOTE — Assessment & Plan Note (Signed)
Unclear the cause of his hypoxia. Pt has been on supplemental O2 for 3 days now. Started at [facility]. CTPA negative for PE or pneumonia. Pt not normally on supplemental O2.

## 2023-05-20 NOTE — Assessment & Plan Note (Signed)
Add IV protonix.

## 2023-05-20 NOTE — ED Notes (Signed)
ED TO INPATIENT HANDOFF REPORT  ED Nurse Name and Phone #: Valeta Harms, RN [phone]  S Name/Age/Gender Andre [name] 71 y.o. male Room/Bed: WA10/WA10  Code Status   Code Status: Limited: Do not attempt resuscitation (DNR) -DNR-LIMITED -Do Not Intubate/DNI   Home/SNF/Other Skilled nursing facility Patient oriented to: self Is this baseline? No   Triage Complete: Triage complete  Chief Complaint Sepsis without acute organ dysfunction [facility]) [A41.9]  Triage Note Pt arrived via EMS, from [place] nursing home. Earlier today, was at baseline, found altered prior to arrival. Unable to answer questions. Responsive to pain.  Was given tylenol for fever of 99.1 at NH   83% on RA  On NRB 15L 93%    Baseline- confusion but able to speak and answer questions.    Allergies Allergies  Allergen Reactions   Atorvastatin Other (See Comments)   Rosuvastatin     Other reaction(s): Liver Disorder   Crestor [Rosuvastatin Calcium]     ADVERSE EFFECTS ON LIVER   Lipitor [Atorvastatin Calcium]    Septra [Sulfamethoxazole-Trimethoprim] Rash    Level of Care/Admitting Diagnosis ED Disposition     ED Disposition  Admit   Condition  --   Comment  Hospital Area: [facility] [[id number]]  Level of Care: Progressive [102]  Admit to Progressive based on following criteria: MULTISYSTEM THREATS such as stable sepsis, metabolic/electrolyte imbalance with or without encephalopathy that is responding to early treatment.  May admit patient to [facility] or [name] if equivalent level of care is available:: No  Covid Evaluation: Asymptomatic - no recent exposure (last 10 days) testing not required  Diagnosis: Sepsis without acute organ dysfunction [facility]) [[id number]]  Admitting Physician: Imogene Burn ERIC [[id number]]  Attending Physician: Imogene Burn, ERIC [[id number]]  Certification:: I certify this patient will need inpatient services for at least 2 midnights  Expected Medical Readiness:  05/22/2023          B Medical/Surgery History Past Medical History:  Diagnosis Date   Allergy    Anemia    Arthritis    Asthma    no treatment x 20 years   Depression    Double vision    occurs at times    Duodenal ulcer    GERD (gastroesophageal reflux disease)    Hemorrhage of rectum and anus 09/15/2013   Hyperlipidemia    Hypertension    Hypothyroidism    Leukopenia due to antineoplastic chemotherapy (HCC) 02/22/2017   Multiple myeloma 07/04/2011   Thyroid disease    TIA (transient ischemic attack) 03/27/2012   Transient diplopia 03/27/2012   Transient global amnesia 06/07/2015   Past Surgical History:  Procedure Laterality Date   BONE MARROW TRANSPLANT  2011   for MM   CARDIOLITE STUDY  11/25/2003   NORMAL   TONSILLECTOMY       A IV Location/Drains/Wounds Patient Lines/Drains/Airways Status     Active Line/Drains/Airways     Name Placement date Placement time Site Days   Peripheral IV 05/20/23 20 G 1" Anterior;Left;Proximal Forearm 05/20/23  1641  Forearm  less than 1   Peripheral IV 05/20/23 20 G Posterior;Right Wrist 05/20/23  1615  Wrist  less than 1   Wound / Incision (Open or Dehisced) 05/10/23 Puncture Lumbar Left bone marrow biopsy 05/10/23  1004  Lumbar  10            Intake/Output Last 24 hours  Intake/Output Summary (Last 24 hours) at 05/20/2023 2214 Last data filed at 05/20/2023 2150 Gross per 24  hour  Intake 1386.47 ml  Output --  Net 1386.47 ml    Labs/Imaging Results for orders placed or performed during the hospital encounter of 05/20/23 (from the past 48 hour(s))  I-Stat Lactic Acid     Status: None   Collection Time: 05/20/23  4:31 PM  Result Value Ref Range   Lactic Acid, Venous 1.2 0.5 - 1.9 mmol/L  CBC with Differential     Status: Abnormal   Collection Time: 05/20/23  4:43 PM  Result Value Ref Range   WBC 8.9 4.0 - 10.5 K/uL   RBC 2.95 (L) 4.22 - 5.81 MIL/uL   Hemoglobin 11.0 (L) 13.0 - 17.0 g/dL   HCT 16.1 (L) 09.6 -  52.0 %   MCV 113.9 (H) 80.0 - 100.0 fL   MCH 37.3 (H) 26.0 - 34.0 pg   MCHC 32.7 30.0 - 36.0 g/dL   RDW 04.5 (H) 40.9 - 81.1 %   Platelets 40 (L) 150 - 400 K/uL    Comment: SPECIMEN CHECKED FOR CLOTS Immature Platelet Fraction may be clinically indicated, consider ordering this additional test [id number] REPEATED TO VERIFY PLATELET COUNT CONFIRMED BY SMEAR    nRBC 0.0 0.0 - 0.2 %   Neutrophils Relative % 71 %   Neutro Abs 6.3 1.7 - 7.7 K/uL   Lymphocytes Relative 2 %   Lymphs Abs 0.2 (L) 0.7 - 4.0 K/uL   Monocytes Relative 14 %   Monocytes Absolute 1.2 (H) 0.1 - 1.0 K/uL   Eosinophils Relative 0 %   Eosinophils Absolute 0.0 0.0 - 0.5 K/uL   Basophils Relative 0 %   Basophils Absolute 0.0 0.0 - 0.1 K/uL   Immature Granulocytes 13 %   Abs Immature Granulocytes 1.18 (H) 0.00 - 0.07 K/uL    Comment: Performed at [facility], [address] [place], [place] [address]  Comprehensive metabolic panel     Status: Abnormal   Collection Time: 05/20/23  4:43 PM  Result Value Ref Range   Sodium 131 (L) 135 - 145 mmol/L   Potassium 3.9 3.5 - 5.1 mmol/L   Chloride 97 (L) 98 - 111 mmol/L   CO2 22 22 - 32 mmol/L   Glucose, Bld 107 (H) 70 - 99 mg/dL    Comment: Glucose reference range applies only to samples taken after fasting for at least 8 hours.   BUN 15 8 - 23 mg/dL   Creatinine, Ser 8.65 0.61 - 1.24 mg/dL   Calcium 9.4 8.9 - 78.4 mg/dL   Total Protein 6.7 6.5 - 8.1 g/dL   Albumin 3.9 3.5 - 5.0 g/dL   AST 45 (H) 15 - 41 U/L   ALT 40 0 - 44 U/L   Alkaline Phosphatase 91 38 - 126 U/L   Total Bilirubin 0.9 0.3 - 1.2 mg/dL   GFR, Estimated >69 >62 mL/min    Comment: (NOTE) Calculated using the CKD-EPI Creatinine Equation (2021)    Anion gap 12 5 - 15    Comment: Performed at [facility], [address], [place], [place] [address]  Lipase, blood     Status: None   Collection Time: 05/20/23  4:43 PM  Result Value Ref Range   Lipase 26 11 - 51  U/L    Comment: Performed at [facility], [address] [place], [place] [address]  Urinalysis, Routine w reflex microscopic -Urine, Clean Catch     Status: Abnormal   Collection Time: 05/20/23  4:48 PM  Result  Value Ref Range   Color, Urine YELLOW YELLOW   APPearance HAZY (A) CLEAR   Specific Gravity, Urine 1.013 1.005 - 1.030   pH 5.0 5.0 - 8.0   Glucose, UA NEGATIVE NEGATIVE mg/dL   Hgb urine dipstick MODERATE (A) NEGATIVE   Bilirubin Urine NEGATIVE NEGATIVE   Ketones, ur NEGATIVE NEGATIVE mg/dL   Protein, ur 30 (A) NEGATIVE mg/dL   Nitrite NEGATIVE NEGATIVE   Leukocytes,Ua LARGE (A) NEGATIVE   RBC / HPF 6-10 0 - 5 RBC/hpf   WBC, UA >50 0 - 5 WBC/hpf   Bacteria, UA RARE (A) NONE SEEN   Squamous Epithelial / HPF 0-5 0 - 5 /HPF   Mucus PRESENT     Comment: Performed at [facility], [address], [place], [place] [address]  Resp panel by RT-PCR (RSV, Flu A&B, Covid) Peripheral     Status: None   Collection Time: 05/20/23  6:25 PM   Specimen: Peripheral; Nasal Swab  Result Value Ref Range   SARS Coronavirus 2 by RT PCR NEGATIVE NEGATIVE    Comment: (NOTE) SARS-CoV-2 target nucleic acids are NOT DETECTED.  The SARS-CoV-2 RNA is generally detectable in upper respiratory specimens during the acute phase of infection. The lowest concentration of SARS-CoV-2 viral copies this assay can detect is 138 copies/mL. A negative result does not preclude SARS-Cov-2 infection and should not be used as the sole basis for treatment or other patient management decisions. A negative result may occur with  improper specimen collection/handling, submission of specimen other than nasopharyngeal swab, presence of viral mutation(s) within the areas targeted by this assay, and inadequate number of viral copies(<138 copies/mL). A negative result must be combined with clinical observations, patient history, and epidemiological information. The expected result  is Negative.  Fact Sheet for Patients:  BloggerCourse.com  Fact Sheet for Healthcare Providers:  SeriousBroker.it  This test is no t yet approved or cleared by the [place] FDA and  has been authorized for detection and/or diagnosis of SARS-CoV-2 by FDA under an Emergency Use Authorization (EUA). This EUA will remain  in effect (meaning this test can be used) for the duration of the COVID-19 declaration under Section 564(b)(1) of the Act, 21 U.S.C.section 360bbb-3(b)(1), unless the authorization is terminated  or revoked sooner.       Influenza A by PCR NEGATIVE NEGATIVE   Influenza B by PCR NEGATIVE NEGATIVE    Comment: (NOTE) The Xpert Xpress SARS-CoV-2/FLU/RSV plus assay is intended as an aid in the diagnosis of influenza from Nasopharyngeal swab specimens and should not be used as a sole basis for treatment. Nasal washings and aspirates are unacceptable for Xpert Xpress SARS-CoV-2/FLU/RSV testing.  Fact Sheet for Patients: BloggerCourse.com  Fact Sheet for Healthcare Providers: SeriousBroker.it  This test is not yet approved or cleared by the [place] FDA and has been authorized for detection and/or diagnosis of SARS-CoV-2 by FDA under an Emergency Use Authorization (EUA). This EUA will remain in effect (meaning this test can be used) for the duration of the COVID-19 declaration under Section 564(b)(1) of the Act, 21 U.S.C. section 360bbb-3(b)(1), unless the authorization is terminated or revoked.     Resp Syncytial Virus by PCR NEGATIVE NEGATIVE    Comment: (NOTE) Fact Sheet for Patients: BloggerCourse.com  Fact Sheet for Healthcare Providers: SeriousBroker.it  This test is not yet approved or cleared by the [place] FDA and has been authorized for detection and/or diagnosis of SARS-CoV-2 by FDA under  an Emergency Use Authorization (  EUA). This EUA will remain in effect (meaning this test can be used) for the duration of the COVID-19 declaration under Section 564(b)(1) of the Act, 21 U.S.C. section 360bbb-3(b)(1), unless the authorization is terminated or revoked.  Performed at [facility], [address], [place], [place] [address]   Blood gas, venous (at [facility] and [facility])     Status: Abnormal   Collection Time: 05/20/23  6:25 PM  Result Value Ref Range   pH, Ven 7.44 (H) 7.25 - 7.43   pCO2, Ven 38 (L) 44 - 60 mmHg   pO2, Ven 48 (H) 32 - 45 mmHg   Bicarbonate 25.8 20.0 - 28.0 mmol/L   Acid-Base Excess 1.7 0.0 - 2.0 mmol/L   O2 Saturation 86.7 %   Patient temperature 37.0     Comment: Performed at [facility], [address] [place], [place] [address]  Ammonia     Status: None   Collection Time: 05/20/23  6:25 PM  Result Value Ref Range   Ammonia 26 9 - 35 umol/L    Comment: Performed at [facility], [address] [place], [place] [address]  I-Stat Lactic Acid     Status: None   Collection Time: 05/20/23  6:46 PM  Result Value Ref Range   Lactic Acid, Venous 1.7 0.5 - 1.9 mmol/L   *Note: Due to a large number of results and/or encounters for the requested time period, some results have not been displayed. A complete set of results can be found in Results Review.   CT Angio Chest PE W and/or Wo Contrast  Result Date: 05/20/2023 CLINICAL DATA:  Pulmonary embolism (PE) suspected, high prob. Found altered. Responsive to pain. EXAM: CT ANGIOGRAPHY CHEST WITH CONTRAST TECHNIQUE: Multidetector CT imaging of the chest was performed using the standard protocol during bolus administration of intravenous contrast. Multiplanar CT image reconstructions and MIPs were obtained to evaluate the vascular anatomy. RADIATION DOSE REDUCTION: This exam was performed according to the departmental dose-optimization program which includes automated  exposure control, adjustment of the mA and/or kV according to patient size and/or use of iterative reconstruction technique. CONTRAST:  75mL OMNIPAQUE IOHEXOL 350 MG/ML SOLN COMPARISON:  05/05/2023 FINDINGS: Cardiovascular: No filling defects in the pulmonary arteries to suggest pulmonary emboli. Heart is enlarged. Coronary artery and aortic atherosclerosis. No aneurysm. Mediastinum/Nodes: No mediastinal, hilar, or axillary adenopathy. Trachea and esophagus are unremarkable. Thyroid unremarkable. Large hiatal hernia. Lungs/Pleura: Elevation and eventration of the right hemidiaphragm. Bibasilar atelectasis. No effusions. Upper Abdomen: No acute findings. Small layering gallstone within the gallbladder. Musculoskeletal: Chest wall soft tissues are unremarkable. Multiple old healing bilateral rib fractures. Chronic mild T12 compression fracture, stable. Chronic patchy lucencies throughout the osseous structures, unchanged. Review of the MIP images confirms the above findings. IMPRESSION: No evidence of pulmonary embolus. Cardiomegaly, coronary artery disease. Bibasilar atelectasis. Large hiatal hernia. Stable chronic patchy lucencies throughout the osseous structures likely related to multiple myeloma. No acute bony abnormality. Aortic Atherosclerosis (ICD10-I70.0). Electronically Signed   By: Charlett Nose M.D.   On: 05/20/2023 20:16   CT Head Wo Contrast  Result Date: 05/20/2023 CLINICAL DATA:  Found altered prior to arrival. Mental status change. Responsive to pain. EXAM: CT HEAD WITHOUT CONTRAST TECHNIQUE: Contiguous axial images were obtained from the base of the skull through the vertex without intravenous contrast. RADIATION DOSE REDUCTION: This exam was performed according to the departmental dose-optimization program which includes automated exposure control, adjustment of the mA and/or kV according to  patient size and/or use of iterative reconstruction technique. COMPARISON:  CT head 04/20/2023 FINDINGS:  Brain: No intracranial hemorrhage, mass effect, or evidence of acute infarct. No hydrocephalus. No extra-axial fluid collection. Age-commensurate cerebral atrophy and ill-defined hypoattenuation within the cerebral white matter consistent with chronic small vessel ischemic disease. Chronic left cerebellar infarct. Vascular: No hyperdense vessel. Intracranial arterial calcification. Skull: No fracture. Multiple lucencies are present throughout the calvarium, similar to prior Sinuses/Orbits: No acute finding. Paranasal sinuses and mastoid air cells are well aerated. Other: None. IMPRESSION: 1. Unchanged CT head from 04/20/2023. No acute intracranial abnormality. Electronically Signed   By: Minerva Fester M.D.   On: 05/20/2023 17:32   DG Chest Portable 1 View  Result Date: 05/20/2023 CLINICAL DATA:  Hypoxia. Concern for pneumonia. Earlier today was at baseline but found altered prior to arrival. EXAM: PORTABLE CHEST 1 VIEW COMPARISON:  Chest radiographs 05/06/2023, 05/05/2023, 01/11/2023; CT chest, abdomen, and pelvis 05/05/2023 FINDINGS: There is again moderate elevation of the right hemidiaphragm. Cardiac silhouette is at the upper limits of normal size for AP technique. Retrocardiac opacity is seen corresponding to the known large hiatal hernia better seen on prior CT. Mild bibasilar linear subsegmental atelectasis is similar to prior. No pneumothorax. Old healed posterior right rib fractures. IMPRESSION: 1. Mild bibasilar linear subsegmental atelectasis is similar to prior. 2. Large hiatal hernia. Electronically Signed   By: Neita Garnet M.D.   On: 05/20/2023 17:32    Pending Labs Unresulted Labs (From admission, onward)     Start     Ordered   05/20/23 2135  TSH  Add-on,   AD        05/20/23 2134   05/20/23 2133  Folate  Add-on,   AD        05/20/23 2132   05/20/23 2133  Vitamin B12  Add-on,   AD        05/20/23 2132   05/20/23 2057  Urine Culture (for pregnant, neutropenic or urologic patients or  patients with an indwelling urinary catheter)  (Urine Labs)  Add-on,   AD       Question:  Indication  Answer:  Altered mental status (if no other cause identified)   05/20/23 2056   05/20/23 2045  Respiratory (~20 pathogens) panel by PCR  (Respiratory panel by PCR (~20 pathogens, ~24 hr TAT)  w precautions)  Add-on,   AD        05/20/23 2044   05/20/23 2042  Procalcitonin  Add-on,   AD       References:    Procalcitonin Lower Respiratory Tract Infection AND Sepsis Procalcitonin Algorithm   05/20/23 2041   05/20/23 1617  Culture, blood (routine x 2)  BLOOD CULTURE X 2,   R      05/20/23 1616   Signed and Held  Comprehensive metabolic panel  Tomorrow morning,   R        Signed and Held   Signed and Held  CBC with Differential/Platelet  Tomorrow morning,   R        Signed and Held   Signed and Held  Magnesium  Tomorrow morning,   R        Signed and Held            Vitals/Pain Today's Vitals   05/20/23 1931 05/20/23 2200 05/20/23 2213 05/20/23 2214  BP: 133/77 (!) 148/81    Pulse: (!) 118   (!) 114  Resp: 18 (!) 27  (!) 22  Temp: 100.3  F (37.9 C)  (!) 103.1 F (39.5 C)   TempSrc: Axillary  Rectal   SpO2: 91%   93%    Isolation Precautions Droplet precaution  Medications Medications  vancomycin (VANCOREADY) IVPB 1500 mg/300 mL (has no administration in time range)  ceFEPIme (MAXIPIME) 2 g in sodium chloride 0.9 % 100 mL IVPB (has no administration in time range)  lactated ringers bolus 1,000 mL (0 mLs Intravenous Stopped 05/20/23 2150)  albuterol (PROVENTIL) (2.5 MG/3ML) 0.083% nebulizer solution 10 mg (10 mg Nebulization Given 05/20/23 1731)  ceFEPIme (MAXIPIME) 2 g in sodium chloride 0.9 % 100 mL IVPB (0 g Intravenous Stopped 05/20/23 1809)  vancomycin (VANCOREADY) IVPB 1500 mg/300 mL (0 mg Intravenous Stopped 05/20/23 1934)  iohexol (OMNIPAQUE) 350 MG/ML injection 75 mL (75 mLs Intravenous Contrast Given 05/20/23 1954)    Mobility non-ambulatory     Focused  Assessments Pulmonary Assessment Handoff:  Lung sounds: Bilateral Breath Sounds: Diminished O2 Device: Nasal Cannula O2 Flow Rate (L/min): 8 L/min    R Recommendations: See Admitting Provider Note  Report given to:   Additional Notes: Pt lethargic but arrousable to voice.

## 2023-05-21 ENCOUNTER — Inpatient Hospital Stay (HOSPITAL_COMMUNITY): Payer: Medicare Other

## 2023-05-21 DIAGNOSIS — A419 Sepsis, unspecified organism: Secondary | ICD-10-CM

## 2023-05-21 DIAGNOSIS — I351 Nonrheumatic aortic (valve) insufficiency: Secondary | ICD-10-CM

## 2023-05-21 DIAGNOSIS — Z66 Do not resuscitate: Secondary | ICD-10-CM

## 2023-05-21 DIAGNOSIS — C9000 Multiple myeloma not having achieved remission: Secondary | ICD-10-CM | POA: Diagnosis not present

## 2023-05-21 DIAGNOSIS — K219 Gastro-esophageal reflux disease without esophagitis: Secondary | ICD-10-CM

## 2023-05-21 DIAGNOSIS — D696 Thrombocytopenia, unspecified: Secondary | ICD-10-CM

## 2023-05-21 DIAGNOSIS — D539 Nutritional anemia, unspecified: Secondary | ICD-10-CM

## 2023-05-21 DIAGNOSIS — E039 Hypothyroidism, unspecified: Secondary | ICD-10-CM

## 2023-05-21 DIAGNOSIS — J9601 Acute respiratory failure with hypoxia: Secondary | ICD-10-CM

## 2023-05-21 DIAGNOSIS — G9341 Metabolic encephalopathy: Secondary | ICD-10-CM | POA: Diagnosis not present

## 2023-05-21 DIAGNOSIS — R6520 Severe sepsis without septic shock: Secondary | ICD-10-CM

## 2023-05-21 DIAGNOSIS — N3000 Acute cystitis without hematuria: Secondary | ICD-10-CM

## 2023-05-21 LAB — RESPIRATORY PANEL BY PCR

## 2023-05-21 LAB — COMPREHENSIVE METABOLIC PANEL
ALT: 35 U/L (ref 0–44)
AST: 35 U/L (ref 15–41)
Albumin: 3.4 g/dL — ABNORMAL LOW (ref 3.5–5.0)
Alkaline Phosphatase: 79 U/L (ref 38–126)
Anion gap: 10 (ref 5–15)
BUN: 14 mg/dL (ref 8–23)
CO2: 23 mmol/L (ref 22–32)
Calcium: 8.9 mg/dL (ref 8.9–10.3)
Chloride: 98 mmol/L (ref 98–111)
Creatinine, Ser: 1 mg/dL (ref 0.61–1.24)
GFR, Estimated: >60 mL/min (ref >60)
Glucose, Bld: 120 mg/dL — ABNORMAL HIGH (ref 70–99)
Potassium: 3.7 mmol/L (ref 3.5–5.1)
Sodium: 131 mmol/L — ABNORMAL LOW (ref 135–145)
Total Bilirubin: 0.9 mg/dL (ref 0.3–1.2)
Total Protein: 6.1 g/dL — ABNORMAL LOW (ref 6.5–8.1)

## 2023-05-21 LAB — CBC WITH DIFFERENTIAL/PLATELET
Abs Immature Granulocytes: 1.14 10*3/uL — ABNORMAL HIGH (ref 0.00–0.07)
Basophils Absolute: 0 10*3/uL (ref 0.0–0.1)
Basophils Relative: 0 %
Eosinophils Absolute: 0 10*3/uL (ref 0.0–0.5)
Eosinophils Relative: 0 %
HCT: 29.5 % — ABNORMAL LOW (ref 39.0–52.0)
Hemoglobin: 9.5 g/dL — ABNORMAL LOW (ref 13.0–17.0)
Immature Granulocytes: 12 %
Lymphocytes Relative: 2 %
Lymphs Abs: 0.2 10*3/uL — ABNORMAL LOW (ref 0.7–4.0)
MCH: 36.4 pg — ABNORMAL HIGH (ref 26.0–34.0)
MCHC: 32.2 g/dL (ref 30.0–36.0)
MCV: 113 fL — ABNORMAL HIGH (ref 80.0–100.0)
Monocytes Absolute: 1.5 10*3/uL — ABNORMAL HIGH (ref 0.1–1.0)
Monocytes Relative: 16 %
Neutro Abs: 6.4 10*3/uL (ref 1.7–7.7)
Neutrophils Relative %: 70 %
Platelets: 33 10*3/uL — ABNORMAL LOW (ref 150–400)
RBC: 2.61 MIL/uL — ABNORMAL LOW (ref 4.22–5.81)
RDW: 20.2 % — ABNORMAL HIGH (ref 11.5–15.5)
WBC: 9.2 10*3/uL (ref 4.0–10.5)
nRBC: 0 % (ref 0.0–0.2)

## 2023-05-21 LAB — PROCALCITONIN: Procalcitonin: 0.61 ng/mL

## 2023-05-21 LAB — T4, FREE: Free T4: 1 ng/dL (ref 0.61–1.12)

## 2023-05-21 LAB — TSH: TSH: 0.173 u[IU]/mL — ABNORMAL LOW (ref 0.350–4.500)

## 2023-05-21 LAB — MAGNESIUM: Magnesium: 1.9 mg/dL (ref 1.7–2.4)

## 2023-05-21 LAB — VITAMIN B12: Vitamin B-12: 1163 pg/mL — ABNORMAL HIGH (ref 180–914)

## 2023-05-21 LAB — FOLATE: Folate: 27.7 ng/mL (ref >5.9)

## 2023-05-21 MED ORDER — PANTOPRAZOLE SODIUM 40 MG PO TBEC
40.0000 mg | DELAYED_RELEASE_TABLET | Freq: Every day | ORAL | Status: DC
  Administered 2023-05-21 – 2023-05-25 (×5): 40 mg via ORAL
  Filled 2023-05-21 (×5): qty 1

## 2023-05-21 MED ORDER — METRONIDAZOLE 500 MG/100ML IV SOLN
500.0000 mg | Freq: Two times a day (BID) | INTRAVENOUS | Status: DC
  Administered 2023-05-21 – 2023-05-23 (×4): 500 mg via INTRAVENOUS
  Filled 2023-05-21 (×4): qty 100

## 2023-05-21 MED ORDER — OYSTER SHELL CALCIUM/D3 500-5 MG-MCG PO TABS
1.0000 | ORAL_TABLET | Freq: Every day | ORAL | Status: DC
  Administered 2023-05-21 – 2023-05-25 (×5): 1 via ORAL
  Filled 2023-05-21 (×5): qty 1

## 2023-05-21 MED ORDER — ESCITALOPRAM OXALATE 10 MG PO TABS
10.0000 mg | ORAL_TABLET | Freq: Every day | ORAL | Status: DC
  Administered 2023-05-21 – 2023-05-25 (×5): 10 mg via ORAL
  Filled 2023-05-21 (×5): qty 1

## 2023-05-21 MED ORDER — ACETAMINOPHEN 650 MG RE SUPP: 650.0000 mg | RECTAL | Status: AC | PRN

## 2023-05-21 MED ORDER — LEVOTHYROXINE SODIUM 25 MCG PO TABS
125.0000 ug | ORAL_TABLET | Freq: Every day | ORAL | Status: DC
  Administered 2023-05-22 – 2023-05-25 (×4): 125 ug via ORAL
  Filled 2023-05-21 (×4): qty 1

## 2023-05-21 MED ORDER — OYSTER SHELL CALCIUM/D3 500-5 MG-MCG PO TABS
1.0000 | ORAL_TABLET | Freq: Every day | ORAL | Status: DC
  Filled 2023-05-21: qty 1

## 2023-05-21 MED ORDER — ACETAMINOPHEN 325 MG PO TABS
650.0000 mg | ORAL_TABLET | Freq: Four times a day (QID) | ORAL | Status: AC | PRN
  Administered 2023-05-21 – 2023-05-22 (×2): 650 mg via ORAL
  Filled 2023-05-21 (×2): qty 2

## 2023-05-21 MED ORDER — VALACYCLOVIR HCL 500 MG PO TABS
500.0000 mg | ORAL_TABLET | Freq: Every day | ORAL | Status: DC
  Administered 2023-05-21 – 2023-05-25 (×5): 500 mg via ORAL
  Filled 2023-05-21 (×5): qty 1

## 2023-05-21 MED ORDER — DAPSONE 100 MG PO TABS
100.0000 mg | ORAL_TABLET | Freq: Every day | ORAL | Status: DC
  Administered 2023-05-21 – 2023-05-25 (×5): 100 mg via ORAL
  Filled 2023-05-21 (×5): qty 1

## 2023-05-21 MED ORDER — FERROUS SULFATE 325 (65 FE) MG PO TABS
325.0000 mg | ORAL_TABLET | Freq: Two times a day (BID) | ORAL | Status: DC
  Administered 2023-05-21 – 2023-05-25 (×9): 325 mg via ORAL
  Filled 2023-05-21 (×9): qty 1

## 2023-05-21 MED ORDER — METHYLPHENIDATE HCL 5 MG PO TABS
2.5000 mg | ORAL_TABLET | Freq: Every day | ORAL | Status: DC
  Administered 2023-05-21 – 2023-05-25 (×5): 2.5 mg via ORAL
  Filled 2023-05-21 (×5): qty 1

## 2023-05-21 NOTE — Plan of Care (Signed)
  Problem: Health Behavior/Discharge Planning: Goal: Ability to manage health-related needs will improve Outcome: Not Progressing   Problem: Clinical Measurements: Goal: Ability to maintain clinical measurements within normal limits will improve Outcome: Not Progressing   Problem: Activity: Goal: Risk for activity intolerance will decrease Outcome: Not Progressing   Problem: Nutrition: Goal: Adequate nutrition will be maintained Outcome: Not Progressing

## 2023-05-21 NOTE — Progress Notes (Signed)
PROGRESS NOTE  Andre [name] OZD:[id number] DOB: 23-Jun-1951   PCP: [name], MD  Patient is from: Nursing home  DOA: 05/20/2023 LOS: 1  Chief complaints Chief Complaint  Patient presents with   Altered Mental Status     Brief Narrative / Interim history: 72 year old M with PMH of MM in remission after stem cell transplant and recent CAR-T therapy in 09/2022, hypogammaglobulinemia on monthly IVIG, pancytopenia, cognitive impairment, hypothyroidism, moderate AR and recent hospitalization from 8/17-8/23 for Pseudomonas bacteremia for which she was treated with IV cefepime and discharged to nursing home on Cipro presenting with acute mental status change, hypoxemia and fever, and admitted with working diagnosis of severe sepsis.  Reportedly saturating at 83% on RA when EMS arrived.  In ED, febrile 202.6.  Tachycardic to 112.  Normotensive.  UA with moderate Hgb and large LE but negative nitrite and rare bacteria.  CBC, CMP, lipase, ammonia and CT head without acute finding.  CXR with large hiatal hernia and bibasilar atelectasis.  CT angio chest negative for PE or pneumonia but bibasilar atelectasis.  COVID-19, influenza and RSV PCR nonreactive.  EKG with sinus tachycardia.  Cultures obtained.  MRI brain ordered.  Started on broad-spectrum antibiotics and admitted.  Oncology consulted.    Subjective: Seen and examined earlier this morning.  Spiked fever to 103 at 10 PM last night.  Feels better this morning.  No complaints.  He denies runny nose, sore throat, chest pain, shortness of breath, cough, nausea, vomiting, abdominal pain, diarrhea, UTI or focal neurosymptoms.  Patient's daughter at bedside.  He is awake and alert and oriented x 4 except date.  Objective: Vitals:   05/21/23 0015 05/21/23 0410 05/21/23 0600 05/21/23 0837  BP: 128/70 (!) 122/58  (!) 118/46  Pulse: 94 75  73  Resp:  20  16  Temp: 99.5 F (37.5 C) 98.9 F (37.2 C)  98.6 F (37 C)  TempSrc: Oral Oral  Oral   SpO2: 92% 95%  94%  Weight:   82.5 kg     Examination:  GENERAL: No apparent distress.  Nontoxic. HEENT: MMM.  Vision and hearing grossly intact.  NECK: Supple.  No apparent JVD.  RESP:  No IWOB.  Fair aeration bilaterally. CVS:  RRR. Heart sounds normal.  ABD/GI/GU: BS+. Abd soft, NTND.  MSK/EXT:  Moves extremities. No apparent deformity. No edema.  SKIN: no apparent skin lesion or wound NEURO: Awake, alert and oriented appropriately.  No apparent focal neuro deficit. PSYCH: Calm. Normal affect.   Procedures:  None  Microbiology summarized: COVID-19, influenza and RSV PCR nonreactive Blood cultures NGTD Urine culture pending Full RVP pending  Assessment and plan: Principal Problem:   Severe sepsis (HCC) Active Problems:   Acute cystitis without hematuria   Acute metabolic encephalopathy   Hypothyroidism   Thrombocytopenia (HCC)   Multiple myeloma not having achieved remission (HCC)   GERD (gastroesophageal reflux disease)   Acute hypoxemic respiratory failure (HCC)   DNR (do not resuscitate)/DNI(Do Not Intubate)   Moderate-to-severe aortic regurgitation   Macrocytic anemia  Severe sepsis: POA.  Had fever, tachycardia, tachypnea, encephalopathy and hypoxic respiratory failure on presentation.  Suspect pulmonary source although CT angio chest did not reveal infiltrate.  Could be lagging behind given acute onset of his times.  He has large hiatal hernia which could put him at risk of aspiration.  Cannot rule out UTI either.  Blood cultures NGTD.  Pro-Cal elevated to 0.61. -Continue broad-spectrum antibiotics -Add IV Flagyl for anaerobic coverage -  Follow full RVP -Aspiration precaution -SLP eval   Acute metabolic encephalopathy: Likely due to the above.  Not on sedating medication as far as I can tell.  No focal neurodeficit.  CT head, B12, ammonia and VBG unrevealing.  Encephalopathy seems to have resolved.  He is currently oriented x 4 except date. -Follow MRI  brain -Reorientation and delirium precaution -Antibiotics as above -Avoid or minimize sedating medications -I agree with dysphagia 3 diet pending SLP  Acute hypoxemic respiratory failure: Likely due to #1.  CT angio chest negative for PE but bibasilar atelectasis and large hiatal hernia. -Antibiotics as above -Wean oxygen as able, incentive spirometry, OOB/PT/OT -Follow-up full RVP   Acute cystitis without hematuria?  UA with pyuria but rare bacteria and negative nitrite.  Patient denies UTI symptoms -Follow urine cultures -Should be covered with antibiotics as above   Moderate-to-severe aortic regurgitation: Chronic -Outpatient follow-up   GERD/large hiatal hernia -Continue Protonix -Aspiration precaution   Multiple myeloma in remission after stem cell transplant and CAR-T cell therapy in 09/2022 -Appreciate input by oncology  Hypogammaglobulinemia-on monthly IVIG. -Outpatient follow-up    Pancytopenia: Relatively stable. -IV iron per oncology/hematology -Continue monitoring   Hypothyroidism: TSH low at 0.173 (0.015 in 02/2023) -Check free T4 -Continue home Synthroid  Physical deconditioning -PT/OT    Body mass index is 28.49 kg/m.           DVT prophylaxis:  SCDs Start: 05/20/23 2217  Code Status: DNR/DNI Family Communication: Updated patient's daughter at bedside Level of care: Progressive Status is: Inpatient Remains inpatient appropriate because: Severe sepsis/possible aspiration pneumonitis/pneumonia   Final disposition: TBD Consultants:  Oncology  55 minutes with more than 50% spent in reviewing records, counseling patient/family and coordinating care.   Sch Meds:  Scheduled Meds:  calcium-vitamin D  1 tablet Oral Daily   dapsone  100 mg Oral Daily   escitalopram  10 mg Oral Daily   ferrous sulfate  325 mg Oral BID WC   levothyroxine  125 mcg Oral Daily   methylphenidate  2.5 mg Oral Daily   pantoprazole  40 mg Oral Daily   valACYclovir   500 mg Oral Daily   Continuous Infusions:  ceFEPime (MAXIPIME) IV 2 g (05/21/23 0917)   metronidazole     vancomycin     PRN Meds:.acetaminophen, ondansetron (ZOFRAN) IV  Antimicrobials: Anti-infectives (From admission, onward)    Start     Dose/Rate Route Frequency Ordered Stop   05/21/23 1800  vancomycin (VANCOREADY) IVPB 1500 mg/300 mL        1,500 mg 150 mL/hr over 120 Minutes Intravenous Every 24 hours 05/20/23 2153     05/21/23 1315  metroNIDAZOLE (FLAGYL) IVPB 500 mg        500 mg 100 mL/hr over 60 Minutes Intravenous Every 12 hours 05/21/23 1217 05/26/23 1314   05/21/23 1315  dapsone tablet 100 mg        100 mg Oral Daily 05/21/23 1228     05/21/23 1315  valACYclovir (VALTREX) tablet 500 mg        500 mg Oral Daily 05/21/23 1228     05/21/23 0200  ceFEPIme (MAXIPIME) 2 g in sodium chloride 0.9 % 100 mL IVPB        2 g 200 mL/hr over 30 Minutes Intravenous Every 8 hours 05/20/23 2153     05/20/23 1645  ceFEPIme (MAXIPIME) 2 g in sodium chloride 0.9 % 100 mL IVPB        2 g 200 mL/hr  over 30 Minutes Intravenous  Once 05/20/23 1638 05/20/23 1809   05/20/23 1645  vancomycin (VANCOREADY) IVPB 1500 mg/300 mL        1,500 mg 150 mL/hr over 120 Minutes Intravenous  Once 05/20/23 1640 05/20/23 1934        I have personally reviewed the following labs and images: CBC: Recent Labs  Lab 05/20/23 1643 05/20/23 2320  WBC 8.9 9.2  NEUTROABS 6.3 6.4  HGB 11.0* 9.5*  HCT 33.6* 29.5*  MCV 113.9* 113.0*  PLT 40* 33*   BMP &GFR Recent Labs  Lab 05/20/23 1643 05/20/23 2320  NA 131* 131*  K 3.9 3.7  CL 97* 98  CO2 22 23  GLUCOSE 107* 120*  BUN 15 14  CREATININE 0.96 1.00  CALCIUM 9.4 8.9  MG  --  1.9   Estimated Creatinine Clearance: 67.7 mL/min (by C-G formula based on SCr of 1 mg/dL). Liver & Pancreas: Recent Labs  Lab 05/20/23 1643 05/20/23 2320  AST 45* 35  ALT 40 35  ALKPHOS 91 79  BILITOT 0.9 0.9  PROT 6.7 6.1*  ALBUMIN 3.9 3.4*   Recent Labs   Lab 05/20/23 1643  LIPASE 26   Recent Labs  Lab 05/20/23 1825  AMMONIA 26   Diabetic: No results for input(s): "HGBA1C" in the last 72 hours. No results for input(s): "GLUCAP" in the last 168 hours. Cardiac Enzymes: No results for input(s): "CKTOTAL", "CKMB", "CKMBINDEX", "TROPONINI" in the last 168 hours. No results for input(s): "PROBNP" in the last 8760 hours. Coagulation Profile: No results for input(s): "INR", "PROTIME" in the last 168 hours. Thyroid Function Tests: Recent Labs    05/20/23 2320  TSH 0.173*   Lipid Profile: No results for input(s): "CHOL", "HDL", "LDLCALC", "TRIG", "CHOLHDL", "LDLDIRECT" in the last 72 hours. Anemia Panel: Recent Labs    05/20/23 2320  VITAMINB12 1,163*  FOLATE 27.7   Urine analysis:    Component Value Date/Time   COLORURINE YELLOW 05/20/2023 1648   APPEARANCEUR HAZY (A) 05/20/2023 1648   APPEARANCEUR Clear 12/22/2015 1614   LABSPEC 1.013 05/20/2023 1648   PHURINE 5.0 05/20/2023 1648   GLUCOSEU NEGATIVE 05/20/2023 1648   GLUCOSEU NEGATIVE 09/06/2017 1040   HGBUR MODERATE (A) 05/20/2023 1648   BILIRUBINUR NEGATIVE 05/20/2023 1648   BILIRUBINUR Negative 12/22/2015 1614   KETONESUR NEGATIVE 05/20/2023 1648   PROTEINUR 30 (A) 05/20/2023 1648   UROBILINOGEN 0.2 09/06/2017 1040   NITRITE NEGATIVE 05/20/2023 1648   LEUKOCYTESUR LARGE (A) 05/20/2023 1648   Sepsis Labs: Invalid input(s): "PROCALCITONIN", "LACTICIDVEN"  Microbiology: Recent Results (from the past 240 hour(s))  Culture, blood (routine x 2)     Status: None (Preliminary result)   Collection Time: 05/20/23  4:44 PM   Specimen: BLOOD  Result Value Ref Range Status   Specimen Description   Final    BLOOD BLOOD LEFT FOREARM Performed at [facility], [address], [place], [place] [address]    Special Requests   Final    BOTTLES DRAWN AEROBIC AND ANAEROBIC Blood Culture adequate volume Performed at [facility], [address]  [place], [place] [address]    Culture   Final    NO GROWTH < 24 HOURS Performed at [facility], [address], [place], [place] [address]    Report Status PENDING  Incomplete  Culture, blood (routine x 2)     Status: None (Preliminary result)   Collection Time: 05/20/23  6:25 PM   Specimen: BLOOD  Result Value Ref Range Status   Specimen Description BLOOD SITE NOT SPECIFIED  Final   Special Requests   Final    BOTTLES DRAWN AEROBIC AND ANAEROBIC Blood Culture results may not be optimal due to an excessive volume of blood received in culture bottles   Culture   Final    NO GROWTH < 12 HOURS Performed at [facility], [address], [place], [place] [address]    Report Status PENDING  Incomplete  Resp panel by RT-PCR (RSV, Flu A&B, Covid) Peripheral     Status: None   Collection Time: 05/20/23  6:25 PM   Specimen: Peripheral; Nasal Swab  Result Value Ref Range Status   SARS Coronavirus 2 by RT PCR NEGATIVE NEGATIVE Final    Comment: (NOTE) SARS-CoV-2 target nucleic acids are NOT DETECTED.  The SARS-CoV-2 RNA is generally detectable in upper respiratory specimens during the acute phase of infection. The lowest concentration of SARS-CoV-2 viral copies this assay can detect is 138 copies/mL. A negative result does not preclude SARS-Cov-2 infection and should not be used as the sole basis for treatment or other patient management decisions. A negative result may occur with  improper specimen collection/handling, submission of specimen other than nasopharyngeal swab, presence of viral mutation(s) within the areas targeted by this assay, and inadequate number of viral copies(<138 copies/mL). A negative result must be combined with clinical observations, patient history, and epidemiological information. The expected result is Negative.  Fact Sheet for Patients:  BloggerCourse.com  Fact Sheet for Healthcare Providers:   SeriousBroker.it  This test is no t yet approved or cleared by the [place] FDA and  has been authorized for detection and/or diagnosis of SARS-CoV-2 by FDA under an Emergency Use Authorization (EUA). This EUA will remain  in effect (meaning this test can be used) for the duration of the COVID-19 declaration under Section 564(b)(1) of the Act, 21 U.S.C.section 360bbb-3(b)(1), unless the authorization is terminated  or revoked sooner.       Influenza A by PCR NEGATIVE NEGATIVE Final   Influenza B by PCR NEGATIVE NEGATIVE Final    Comment: (NOTE) The Xpert Xpress SARS-CoV-2/FLU/RSV plus assay is intended as an aid in the diagnosis of influenza from Nasopharyngeal swab specimens and should not be used as a sole basis for treatment. Nasal washings and aspirates are unacceptable for Xpert Xpress SARS-CoV-2/FLU/RSV testing.  Fact Sheet for Patients: BloggerCourse.com  Fact Sheet for Healthcare Providers: SeriousBroker.it  This test is not yet approved or cleared by the [place] FDA and has been authorized for detection and/or diagnosis of SARS-CoV-2 by FDA under an Emergency Use Authorization (EUA). This EUA will remain in effect (meaning this test can be used) for the duration of the COVID-19 declaration under Section 564(b)(1) of the Act, 21 U.S.C. section 360bbb-3(b)(1), unless the authorization is terminated or revoked.     Resp Syncytial Virus by PCR NEGATIVE NEGATIVE Final    Comment: (NOTE) Fact Sheet for Patients: BloggerCourse.com  Fact Sheet for Healthcare Providers: SeriousBroker.it  This test is not yet approved or cleared by the [place] FDA and has been authorized for detection and/or diagnosis of SARS-CoV-2 by FDA under an Emergency Use Authorization (EUA). This EUA will remain in effect (meaning this test can be used) for  the duration of the COVID-19 declaration under Section 564(b)(1) of the Act, 21 U.S.C. section 360bbb-3(b)(1), unless the authorization is terminated or revoked.  Performed at [facility], [address] [place], [place]  [address]     Radiology Studies: CT Angio Chest PE W and/or Wo Contrast  Result Date: 05/20/2023 CLINICAL DATA:  Pulmonary embolism (PE) suspected, high prob. Found altered. Responsive to pain. EXAM: CT ANGIOGRAPHY CHEST WITH CONTRAST TECHNIQUE: Multidetector CT imaging of the chest was performed using the standard protocol during bolus administration of intravenous contrast. Multiplanar CT image reconstructions and MIPs were obtained to evaluate the vascular anatomy. RADIATION DOSE REDUCTION: This exam was performed according to the departmental dose-optimization program which includes automated exposure control, adjustment of the mA and/or kV according to patient size and/or use of iterative reconstruction technique. CONTRAST:  75mL OMNIPAQUE IOHEXOL 350 MG/ML SOLN COMPARISON:  05/05/2023 FINDINGS: Cardiovascular: No filling defects in the pulmonary arteries to suggest pulmonary emboli. Heart is enlarged. Coronary artery and aortic atherosclerosis. No aneurysm. Mediastinum/Nodes: No mediastinal, hilar, or axillary adenopathy. Trachea and esophagus are unremarkable. Thyroid unremarkable. Large hiatal hernia. Lungs/Pleura: Elevation and eventration of the right hemidiaphragm. Bibasilar atelectasis. No effusions. Upper Abdomen: No acute findings. Small layering gallstone within the gallbladder. Musculoskeletal: Chest wall soft tissues are unremarkable. Multiple old healing bilateral rib fractures. Chronic mild T12 compression fracture, stable. Chronic patchy lucencies throughout the osseous structures, unchanged. Review of the MIP images confirms the above findings. IMPRESSION: No evidence of pulmonary embolus. Cardiomegaly, coronary artery disease. Bibasilar  atelectasis. Large hiatal hernia. Stable chronic patchy lucencies throughout the osseous structures likely related to multiple myeloma. No acute bony abnormality. Aortic Atherosclerosis (ICD10-I70.0). Electronically Signed   By: Charlett Nose M.D.   On: 05/20/2023 20:16   CT Head Wo Contrast  Result Date: 05/20/2023 CLINICAL DATA:  Found altered prior to arrival. Mental status change. Responsive to pain. EXAM: CT HEAD WITHOUT CONTRAST TECHNIQUE: Contiguous axial images were obtained from the base of the skull through the vertex without intravenous contrast. RADIATION DOSE REDUCTION: This exam was performed according to the departmental dose-optimization program which includes automated exposure control, adjustment of the mA and/or kV according to patient size and/or use of iterative reconstruction technique. COMPARISON:  CT head 04/20/2023 FINDINGS: Brain: No intracranial hemorrhage, mass effect, or evidence of acute infarct. No hydrocephalus. No extra-axial fluid collection. Age-commensurate cerebral atrophy and ill-defined hypoattenuation within the cerebral white matter consistent with chronic small vessel ischemic disease. Chronic left cerebellar infarct. Vascular: No hyperdense vessel. Intracranial arterial calcification. Skull: No fracture. Multiple lucencies are present throughout the calvarium, similar to prior Sinuses/Orbits: No acute finding. Paranasal sinuses and mastoid air cells are well aerated. Other: None. IMPRESSION: 1. Unchanged CT head from 04/20/2023. No acute intracranial abnormality. Electronically Signed   By: Minerva Fester M.D.   On: 05/20/2023 17:32   DG Chest Portable 1 View  Result Date: 05/20/2023 CLINICAL DATA:  Hypoxia. Concern for pneumonia. Earlier today was at baseline but found altered prior to arrival. EXAM: PORTABLE CHEST 1 VIEW COMPARISON:  Chest radiographs 05/06/2023, 05/05/2023, 01/11/2023; CT chest, abdomen, and pelvis 05/05/2023 FINDINGS: There is again moderate  elevation of the right hemidiaphragm. Cardiac silhouette is at the upper limits of normal size for AP technique. Retrocardiac opacity is seen corresponding to the known large hiatal hernia better seen on prior CT. Mild bibasilar linear subsegmental atelectasis is similar to prior. No pneumothorax. Old healed posterior right rib fractures. IMPRESSION: 1. Mild bibasilar linear subsegmental atelectasis is similar to prior. 2. Large hiatal hernia. Electronically Signed   By: Neita Garnet M.D.   On: 05/20/2023 17:32      Cyanna Neace T. Breyanna Valera [facility]  If 7PM-7AM, please contact night-coverage  [email or web address] 05/21/2023, 12:28 PM

## 2023-05-21 NOTE — Evaluation (Signed)
Physical Therapy Evaluation Patient Details Name: Andre [name] MRN: [id number] DOB: [birth year] Today's Date: 05/21/2023  History of Present Illness  72 yo male admitted from SNF with AMS, fever, hypoxia, UTI, sepsis. Hx of multiple myeloma s/p CAR-T therapy 09/2022, persistent pancytopenia, respiratory failure, pseudomonas bacterermia, fall  Clinical Impression  On eval, pt required Max A for bed mobility. Pt unable to maintain static sitting balance once EOB. Unable to correct sitting posture when cued. Follows 1 step commands with increased time. O2 91% on 7L HFNC during session. Pt presents with general weakness, decreased activity tolerance, and impaired gait and balance. No family present during session. Pt exhibits some cognitive deficits during session. Will plan to follow and progress activity as tolerated. Patient will benefit from continued inpatient follow up therapy, <3 hours/day      If plan is discharge home, recommend the following: Assistance with cooking/housework;Direct supervision/assist for medications management;Direct supervision/assist for financial management;Assist for transportation;Two people to help with walking and/or transfers;A lot of help with bathing/dressing/bathroom   Can travel by private vehicle   No    Equipment Recommendations None recommended by PT  Recommendations for Other Services       Functional Status Assessment Patient has had a recent decline in their functional status and demonstrates the ability to make significant improvements in function in a reasonable and predictable amount of time.     Precautions / Restrictions Precautions Precautions: Fall Precaution Comments: monitor O2-on HFNC  Restrictions Weight Bearing Restrictions: No      Mobility  Bed Mobility Overal bed mobility: Needs Assistance Bed Mobility: Supine to Sit, Sit to Supine     Supine to sit: Max assist, HOB elevated Sit to supine: Max assist, HOB elevated    General bed mobility comments: Assist for trunk and bil LEs. Utilized bedpad for scooting, positioning. Increased time. Multimodal cueing required. Poor static sitting balance with patient unable to right himself-leaning to L side/propped on elbow. Assist to correct but pt unable to maintain.    Transfers                   General transfer comment: NT-unable to safely attempt with +1 assist 2* weakness, poor sitting balance    Ambulation/Gait                  Stairs            Wheelchair Mobility     Tilt Bed    Modified Rankin (Stroke Patients Only)       Balance Overall balance assessment: Needs assistance   Sitting balance-Leahy Scale: Poor                                       Pertinent Vitals/Pain Pain Assessment Pain Assessment: No/denies pain    Home Living Family/patient expects to be discharged to:: Skilled nursing facility Living Arrangements: Spouse/significant other               Home Equipment: Rolling Walker (2 wheels);Gilmer Mor - single point Additional Comments: pt was at rehab portion of [facility] but had recently transitioned to  the ALF part.    Prior Function Prior Level of Function : Needs assist             Mobility Comments: per patient he was using RW and able to amb ADLs Comments: per patient he dresses and bathes himself, Engineer, manufacturing  manages medicine     Extremity/Trunk Assessment   Upper Extremity Assessment Upper Extremity Assessment: Defer to OT evaluation    Lower Extremity Assessment Lower Extremity Assessment: Generalized weakness       Communication   Communication Communication: Difficulty following commands/understanding Following commands: Follows one step commands with increased time Cueing Techniques: Verbal cues;Tactile cues  Cognition Arousal: Alert Behavior During Therapy: Flat affect Overall Cognitive Status: No family/caregiver present to determine baseline cognitive  functioning Area of Impairment: Memory, Problem solving                     Memory: Decreased short-term memory       Problem Solving: Slow processing, Requires verbal cues General Comments: per family some baseline short term memory deficits        General Comments      Exercises     Assessment/Plan    PT Assessment Patient needs continued PT services  PT Problem List Decreased strength;Decreased range of motion;Decreased activity tolerance;Decreased balance;Decreased mobility;Decreased knowledge of use of DME       PT Treatment Interventions      PT Goals (Current goals can be found in the Care Plan section)  Acute Rehab PT Goals Patient Stated Goal: none stated PT Goal Formulation: With patient Time For Goal Achievement: 06/04/23 Potential to Achieve Goals: Fair    Frequency Min 1X/week     Co-evaluation               AM-PAC PT "6 Clicks" Mobility  Outcome Measure Help needed turning from your back to your side while in a flat bed without using bedrails?: A Little Help needed moving from lying on your back to sitting on the side of a flat bed without using bedrails?: A Lot Help needed moving to and from a bed to a chair (including a wheelchair)?: Total Help needed standing up from a chair using your arms (e.g., wheelchair or bedside chair)?: Total Help needed to walk in hospital room?: Total Help needed climbing 3-5 steps with a railing? : Total 6 Click Score: 9    End of Session   Activity Tolerance: Patient tolerated treatment well Patient left: in bed;with call bell/phone within reach;with bed alarm set   PT Visit Diagnosis: Difficulty in walking, not elsewhere classified (R26.2);Muscle weakness (generalized) (M62.81);History of falling (Z91.81)    Time: 1191-4782 PT Time Calculation (min) (ACUTE ONLY): 25 min   Charges:   PT Evaluation $PT Eval Low Complexity: 1 Low PT Treatments $Therapeutic Activity: 8-22 mins PT General  Charges $$ ACUTE PT VISIT: 1 Visit          Faye Ramsay, PT Acute Rehabilitation  Office: [phone]

## 2023-05-21 NOTE — Evaluation (Signed)
Clinical/Bedside Swallow Evaluation Patient Details  Name: Andre [name] MRN: [id number] Date of Birth: 07-14-1951  Today's Date: 05/21/2023 Time: SLP Start Time (ACUTE ONLY): 1025 SLP Stop Time (ACUTE ONLY): 1040 SLP Time Calculation (min) (ACUTE ONLY): 15 min  Past Medical History:  Past Medical History:  Diagnosis Date   Allergy    Anemia    Arthritis    Asthma    no treatment x 20 years   Depression    Double vision    occurs at times    Duodenal ulcer    GERD (gastroesophageal reflux disease)    Hemorrhage of rectum and anus 09/15/2013   Hyperlipidemia    Hypertension    Hypothyroidism    Leukopenia due to antineoplastic chemotherapy (HCC) 02/22/2017   Multiple myeloma 07/04/2011   Thyroid disease    TIA (transient ischemic attack) 03/27/2012   Transient diplopia 03/27/2012   Transient global amnesia 06/07/2015   Past Surgical History:  Past Surgical History:  Procedure Laterality Date   BONE MARROW TRANSPLANT  2011   for MM   CARDIOLITE STUDY  11/25/2003   NORMAL   TONSILLECTOMY     HPI:  Patient is a 72 y.o. male with PMH: GERD, HTN, HLD, thyroid disease, multiple myeloma, large hiatal hernia, dysphagia. He was admitted to hospital previous month on 05/05/23 after being found down in his bed with gurgling respirations, emesis and was suctioned for large amount of gastric contents; he was hypoxemic and found to have aspiration PNA. Bedside swallow eval and modified bariums swallow study both completed with findings of penetration but no aspiration with larger sips of thin liquids. He was discharged to SNF for rehab at [facility] on 05/11/23. He presented for current admission on 05/20/23 for AMS, hypoxia and fever. He was placed on NRB by EMS secondary to room air saturations at 83%. In ER, temp was 102.6, HR 112, BP 148/87, Chest x-ray showed large hiatal hernia.  There is bibasilar atelectasis, CT head negative for acute intracranial abnormality. He was admitted for acute  encephalopathy.    Assessment / Plan / Recommendation  Clinical Impression  Patient is presenting with clinical s/s of dysphagia as per this bedside swallow evaluation, however as per this assessment and daughter's report, does not appear to be a significant change from his baseline dysphagia. Daughter indicated that she often comes to visit patient and will see him eating/drinking while reclined in bed. SLP reviewed recommendations from [facility] with patient and daughter and posted recommendations on wall in room. Primary recommendation is for patient to be sitting upright for all PO intake (recliner, chair would be best). Although cup sips are recommended to reduce risk of penetration (seen on MBS), if patient's liquid intake (primarily water) is poor, he may have plain, thin water at bedside with straw. SLP not recommending further intervention at this time, but recommending SNF level SLP intervention to maximize swallow safety via consistent use of precautions. SLP Visit Diagnosis: Dysphagia, pharyngeal phase (R13.13)    Aspiration Risk  Mild aspiration risk    Diet Recommendation Dysphagia 3 (Mech soft);Thin liquid    Liquid Administration via: Cup;No straw Medication Administration: Whole meds with puree Supervision: Patient able to self feed;Intermittent supervision to cue for compensatory strategies Compensations: Slow rate;Small sips/bites Postural Changes: Seated upright at 90 degrees;Remain upright for at least 30 minutes after po intake    Other  Recommendations Oral Care Recommendations: Oral care BID    Recommendations for follow up therapy are one component of  a multi-disciplinary discharge planning process, led by the attending physician.  Recommendations may be updated based on patient status, additional functional criteria and insurance authorization.  Follow up Recommendations Skilled nursing-short term rehab (<3 hours/day)      Assistance Recommended at Discharge     Functional Status Assessment Patient has not had a recent decline in their functional status  Frequency and Duration     N/A       Prognosis   N/A     Swallow Study   General Date of Onset: 05/20/23 HPI: Patient is a 72 y.o. male with PMH: GERD, HTN, HLD, thyroid disease, multiple myeloma, large hiatal hernia, dysphagia. He was admitted to hospital previous month on 05/05/23 after being found down in his bed with gurgling respirations, emesis and was suctioned for large amount of gastric contents; he was hypoxemic and found to have aspiration PNA. Bedside swallow eval and modified bariums swallow study both completed with findings of penetration but no aspiration with larger sips of thin liquids. He was discharged to SNF for rehab at [facility] on 05/11/23. He presented for current admission on 05/20/23 for AMS, hypoxia and fever. He was placed on NRB by EMS secondary to room air saturations at 83%. In ER, temp was 102.6, HR 112, BP 148/87, Chest x-ray showed large hiatal hernia.  There is bibasilar atelectasis, CT head negative for acute intracranial abnormality. He was admitted for acute encephalopathy. Type of Study: Bedside Swallow Evaluation Previous Swallow Assessment: BSE and MBS last month admission Diet Prior to this Study: Dysphagia 3 (mechanical soft);Thin liquids (Level 0) Temperature Spikes Noted: Yes (102.6 on admission) Respiratory Status: Nasal cannula History of Recent Intubation: No Behavior/Cognition: Alert;Cooperative;Pleasant mood Oral Cavity Assessment: Within Functional Limits Oral Care Completed by SLP: No Oral Cavity - Dentition: Adequate natural dentition Self-Feeding Abilities: Able to feed self Patient Positioning: Upright in bed Baseline Vocal Quality: Low vocal intensity;Normal Volitional Cough: Weak Volitional Swallow: Able to elicit    Oral/Motor/Sensory Function Overall Oral Motor/Sensory Function: Within functional limits   Ice Chips     Thin Liquid Thin  Liquid: Impaired Presentation: Straw;Cup;Self Fed Pharyngeal  Phase Impairments: Cough - Delayed    Nectar Thick Nectar Thick Liquid: Not tested   Honey Thick Honey Thick Liquid: Not tested   Puree Puree: Not tested   Solid     Solid: Not tested      Angela Nevin, MA, CCC-SLP Speech Therapy

## 2023-05-21 NOTE — Progress Notes (Signed)
IP PROGRESS NOTE  Subjective:   Andre [name] is known to me with a history of multiple myeloma, status post CAR-T therapy.  He has persistent pancytopenia following CAR-T therapy. He was admitted 05/05/2023 with respiratory failure and Pseudomonas bacteremia.  He completed an outpatient course of ciprofloxacin. Andre [name] was admitted yesterday with altered mental status, fever, and hypoxia.  A urinalysis was consistent with a urinary tract infection.  Andre [name] wants to eat.  He has no other complaint.  He is not sure why he was admitted, but remembers having a fever. . Objective: Vital signs in last 24 hours: Blood pressure (!) 122/58, pulse 75, temperature 98.9 F (37.2 C), temperature source Oral, resp. rate 20, weight 181 lb 14.4 oz (82.5 kg), SpO2 95%.  Intake/Output from previous day: 09/01 0701 - 09/02 0700 In: 2107.5 [I.V.:621; IV Piggyback:1486.5] Out: 600 [Urine:600]  Physical Exam:  HEENT: No thrush Lungs: Decreased breath sounds with end inspiratory rhonchi at the lower posterior chest bilaterally, no respiratory distress Cardiac: Regular rate and rhythm Abdomen: No hepatosplenomegaly Extremities: No leg edema Neurologic: Alert, oriented to place and diagnosis.   Lab Results: Recent Labs    05/20/23 1643 05/20/23 2320  WBC 8.9 9.2  HGB 11.0* 9.5*  HCT 33.6* 29.5*  PLT 40* 33*    BMET Recent Labs    05/20/23 1643 05/20/23 2320  NA 131* 131*  K 3.9 3.7  CL 97* 98  CO2 22 23  GLUCOSE 107* 120*  BUN 15 14  CREATININE 0.96 1.00  CALCIUM 9.4 8.9    No results found for: "CEA1", "CEA", "ZOX096", "CA125"  Studies/Results: CT Angio Chest PE W and/or Wo Contrast  Result Date: 05/20/2023 CLINICAL DATA:  Pulmonary embolism (PE) suspected, high prob. Found altered. Responsive to pain. EXAM: CT ANGIOGRAPHY CHEST WITH CONTRAST TECHNIQUE: Multidetector CT imaging of the chest was performed using the standard protocol during bolus administration of  intravenous contrast. Multiplanar CT image reconstructions and MIPs were obtained to evaluate the vascular anatomy. RADIATION DOSE REDUCTION: This exam was performed according to the departmental dose-optimization program which includes automated exposure control, adjustment of the mA and/or kV according to patient size and/or use of iterative reconstruction technique. CONTRAST:  75mL OMNIPAQUE IOHEXOL 350 MG/ML SOLN COMPARISON:  05/05/2023 FINDINGS: Cardiovascular: No filling defects in the pulmonary arteries to suggest pulmonary emboli. Heart is enlarged. Coronary artery and aortic atherosclerosis. No aneurysm. Mediastinum/Nodes: No mediastinal, hilar, or axillary adenopathy. Trachea and esophagus are unremarkable. Thyroid unremarkable. Large hiatal hernia. Lungs/Pleura: Elevation and eventration of the right hemidiaphragm. Bibasilar atelectasis. No effusions. Upper Abdomen: No acute findings. Small layering gallstone within the gallbladder. Musculoskeletal: Chest wall soft tissues are unremarkable. Multiple old healing bilateral rib fractures. Chronic mild T12 compression fracture, stable. Chronic patchy lucencies throughout the osseous structures, unchanged. Review of the MIP images confirms the above findings. IMPRESSION: No evidence of pulmonary embolus. Cardiomegaly, coronary artery disease. Bibasilar atelectasis. Large hiatal hernia. Stable chronic patchy lucencies throughout the osseous structures likely related to multiple myeloma. No acute bony abnormality. Aortic Atherosclerosis (ICD10-I70.0). Electronically Signed   By: Charlett Nose M.D.   On: 05/20/2023 20:16   CT Head Wo Contrast  Result Date: 05/20/2023 CLINICAL DATA:  Found altered prior to arrival. Mental status change. Responsive to pain. EXAM: CT HEAD WITHOUT CONTRAST TECHNIQUE: Contiguous axial images were obtained from the base of the skull through the vertex without intravenous contrast. RADIATION DOSE REDUCTION: This exam was performed  according to the departmental dose-optimization program which includes  automated exposure control, adjustment of the mA and/or kV according to patient size and/or use of iterative reconstruction technique. COMPARISON:  CT head 04/20/2023 FINDINGS: Brain: No intracranial hemorrhage, mass effect, or evidence of acute infarct. No hydrocephalus. No extra-axial fluid collection. Age-commensurate cerebral atrophy and ill-defined hypoattenuation within the cerebral white matter consistent with chronic small vessel ischemic disease. Chronic left cerebellar infarct. Vascular: No hyperdense vessel. Intracranial arterial calcification. Skull: No fracture. Multiple lucencies are present throughout the calvarium, similar to prior Sinuses/Orbits: No acute finding. Paranasal sinuses and mastoid air cells are well aerated. Other: None. IMPRESSION: 1. Unchanged CT head from 04/20/2023. No acute intracranial abnormality. Electronically Signed   By: Minerva Fester M.D.   On: 05/20/2023 17:32   DG Chest Portable 1 View  Result Date: 05/20/2023 CLINICAL DATA:  Hypoxia. Concern for pneumonia. Earlier today was at baseline but found altered prior to arrival. EXAM: PORTABLE CHEST 1 VIEW COMPARISON:  Chest radiographs 05/06/2023, 05/05/2023, 01/11/2023; CT chest, abdomen, and pelvis 05/05/2023 FINDINGS: There is again moderate elevation of the right hemidiaphragm. Cardiac silhouette is at the upper limits of normal size for AP technique. Retrocardiac opacity is seen corresponding to the known large hiatal hernia better seen on prior CT. Mild bibasilar linear subsegmental atelectasis is similar to prior. No pneumothorax. Old healed posterior right rib fractures. IMPRESSION: 1. Mild bibasilar linear subsegmental atelectasis is similar to prior. 2. Large hiatal hernia. Electronically Signed   By: Neita Garnet M.D.   On: 05/20/2023 17:32    Medications: I have reviewed the patient's current medications.  Assessment/Plan: Multiple  myeloma- IgG lambda  bortezomib (subcutaneously), lenalidomide, and dexamethasone, with repeat bone marrow biopsy May of 2012 showing 10% plasmacytosis   (2) high-dose chemotherapy with BCNU and melphalan at [facility], followed by stem cell rescue July of 2012   (3) on zoledronic acid started December of 2012, initially monthly, currently given every 3 months, most recent dose  12/07/2015   (4) low-dose lenalidomide resumed April 2013, interrupted several times.  Resumed again on 02/19/2013, ata dose of 5 mg daily, 21 days on and 7 days off, later further reduced to 7 days on, 7 days off   (5) CNS symptoms and abnormal brain MRI extensively evaluated by neurology with no definitive diagnosis established   (6) rising M spike noted June 2014 but did not meet criteria for carfilzomib study   (7) on lenalidomide 25 mg daily, 14 days on, 7 days off, starting 04/18/2013, interrupted December 2014 because of rash;             (a) resumed January 2015 at 10 mg/ day at 21 days on/ 7 days off             (b) starting 08/18/2014 decreased to 10 mg/ day 14 days on, 7 days off because of cytopenias             (c) as of February 2016 was on 5 mg daily 7 days on 7 days off             (d) lenalidomide discontinued December 2016 with evidence of disease progression   (8) transient global amnesia 05/29/2015, resolved without intervention   (9) starting PVD 10/25/2015 w ASA 325 thromboprophylaxis, valacyclovir prophylaxis, last dose 12/17/2015             (a) pomalidomide 4 mg/d days 1-14             (b) bortezomib sQ days 2,5,9,12 of each 21 day cycle             (  c) dexamethasome 20 mg two days a week             (d) dexamethasone bortezomib and pomalidomide discontinued late December 2018 with poor tolerance   (10) metapneumovirus pneumonia April 2017             (a) completing course of steroids and week of bactrim mid April 2017   (11) status post second autologous transplant at [facility]  02/04/2016(preparatory regimen melphalan 200 mg/m)             (a) received twelve-month vaccinations 03/14/2017 (DPT, Haemophilus, Pneumovax 13, polio)             (b) 14 months injections 05/04/2016 include DTaP, Hib conjugate, HepB energex B 20 mcg/ml, Prevnar 13             (c) 24 month vaccines due at [facility] June 2019   (12) maintenance therapy started November 2017, consisting of             (a) bortezomib 1.3 mg/M2 every 14 days, first dose 07/27/2016             (b) pomalidomide 1 mg days 1-21 Q28 days, started 07/19/2016             (c) zolendronate monthly started 07/27/2016 (previously Q 3 months) however patient unable to tolerate, and changed back to q3 months in April, 2018             (d) Bortezomib changed to monthly as of June 2018 because of tolerance issues, however discontinued after 09/14/2017 dose because of a rise in his M spike             (e) pomalidomide held after 10/18/2017 in preparation for possible study at [facility] (venetoclax)--never resumed             (f) with numbers actually improved off treatment, resumed every 2-week bortezomib 12/25/2017             (g) changed to every 3-week bortezomib as of 03/17/2019             (h) briefly on weekly treatments times 22 October 2020 due to increase in M spike             (I) maintenance therapy discontinued with evidence of progression   (13) bortezomib/daratumumab/dexamethasone started 12/20/2020.             (a) Decadron dose dropped to 10 mg day of and day following treatment starting May 2022             (b) day 8 bortezomib omitted beginning with the June cycle per patient preference             (C) treatment changed to monthly daratumumab/Velcade/Decadron beginning 06/27/2021-patient decision (14) weekly Velcade/Cytoxan beginning 11/14/2021 (15) changed to Cytoxan/carfilzomib/Decadron 12/19/2021-treatment placed on hold 05/15/2022, carfilzomib 05/24/2022 and 05/31/2022 16.  Leukocyte a pheresis procedure at [facility]  06/15/2022 17.  Treatment resumed with Cytoxan/carfilzomib/Decadron 06/28/2022, last given 09/06/2022 18.  CAR-T 09/26/2022 with Carvykti 19.  CRS and ICANS following CAR-T therapy, status post a Decadron taper completed 10/08/2022 20.  Presentation to the emergency room 10/30/2022 with failure to thrive and a fall  21.  Pancytopenia secondary to multiple myeloma and CAR-T therapy-improved 22.  Bone marrow biopsy 11/13/2022-mildly hypocellular bone marrow with relative erythroid hyperplasia and decreased megakaryocytes; no plasma cells identified by differential count or CD138 immunohistochemical stain; flow cytometry negative for a clonal plasma cell population.  The bone  marrow biopsy 05/10/2023-mildly hypercellular marrow with mild decrease in megakaryocytes, plasma cells not increased, absent iron stores 23.  Cognitive impairment predating CAR-T therapy and worsened following CAR-T therapy 24.  Admission 05/05/2023 with Pseudomonas sepsis CTs chest, abdomen, and pelvis-right lung base atelectasis, no infiltrate 25.  Admission 05/20/2023 with hypoxia, altered mental status, and fever Infectious disease evaluation on hospital admission consistent with a urinary tract infection   Mr [name] has multiple myeloma.  He underwent CAR-T therapy in January.  He is in remission from myeloma, but has persistent pancytopenia.  He has hypogammaglobulinemia and receives monthly IVIG therapy at the cancer center.  He is admitted with Pseudomonas bacteremia/sepsis.  No clear source for infection was identified.  He completed an outpatient course of ciprofloxacin.   He has persistent pancytopenia following CAR-T therapy.  He had a bone marrow biopsy 05/10/2023.  No evidence of myeloma.  Her stores were diminished.  Cytogenetics and a myeloma FISH panel are pending.  Andre [name] is readmitted with altered mental status, hypoxia, and fever.  He appears to have a urinary tract infection.  The hospital admission is likely  related to urosepsis.  He has a cognitive disorder at baseline, likely worsened with acute infection.  He is alert this morning and his mental status appears near baseline.   Recommendations: Continue antibiotics, follow-up urine and blood cultures Continue monthly IVIG as an outpatient Begin oral iron therapy Transfuse platelets for bleeding or a count of less than 10,000 Oncology will continue following him in the hospital and outpatient follow-up will be scheduled at the Cancer center.   LOS: 1 day   Thornton Papas, MD   05/21/2023, 7:27 AM

## 2023-05-22 DIAGNOSIS — J9601 Acute respiratory failure with hypoxia: Secondary | ICD-10-CM | POA: Diagnosis not present

## 2023-05-22 DIAGNOSIS — Z66 Do not resuscitate: Secondary | ICD-10-CM | POA: Diagnosis not present

## 2023-05-22 DIAGNOSIS — G9341 Metabolic encephalopathy: Secondary | ICD-10-CM | POA: Diagnosis not present

## 2023-05-22 DIAGNOSIS — A419 Sepsis, unspecified organism: Secondary | ICD-10-CM | POA: Diagnosis not present

## 2023-05-22 DIAGNOSIS — R6520 Severe sepsis without septic shock: Secondary | ICD-10-CM | POA: Diagnosis not present

## 2023-05-22 LAB — COMPREHENSIVE METABOLIC PANEL
ALT: 35 U/L (ref 0–44)
AST: 29 U/L (ref 15–41)
Albumin: 3 g/dL — ABNORMAL LOW (ref 3.5–5.0)
Alkaline Phosphatase: 66 U/L (ref 38–126)
Anion gap: 6 (ref 5–15)
BUN: 15 mg/dL (ref 8–23)
CO2: 23 mmol/L (ref 22–32)
Calcium: 9.2 mg/dL (ref 8.9–10.3)
Chloride: 108 mmol/L (ref 98–111)
Creatinine, Ser: 0.92 mg/dL (ref 0.61–1.24)
GFR, Estimated: >60 mL/min (ref >60)
Glucose, Bld: 93 mg/dL (ref 70–99)
Potassium: 4 mmol/L (ref 3.5–5.1)
Sodium: 137 mmol/L (ref 135–145)
Total Bilirubin: 0.7 mg/dL (ref 0.3–1.2)
Total Protein: 5.7 g/dL — ABNORMAL LOW (ref 6.5–8.1)

## 2023-05-22 LAB — CBC
HCT: 29.8 % — ABNORMAL LOW (ref 39.0–52.0)
Hemoglobin: 9.5 g/dL — ABNORMAL LOW (ref 13.0–17.0)
MCH: 37 pg — ABNORMAL HIGH (ref 26.0–34.0)
MCHC: 31.9 g/dL (ref 30.0–36.0)
MCV: 116 fL — ABNORMAL HIGH (ref 80.0–100.0)
Platelets: 32 10*3/uL — ABNORMAL LOW (ref 150–400)
RBC: 2.57 MIL/uL — ABNORMAL LOW (ref 4.22–5.81)
RDW: 19.7 % — ABNORMAL HIGH (ref 11.5–15.5)
WBC: 4.3 10*3/uL (ref 4.0–10.5)
nRBC: 0 % (ref 0.0–0.2)

## 2023-05-22 LAB — PHOSPHORUS: Phosphorus: 2.3 mg/dL — ABNORMAL LOW (ref 2.5–4.6)

## 2023-05-22 LAB — MAGNESIUM: Magnesium: 2.3 mg/dL (ref 1.7–2.4)

## 2023-05-22 LAB — CK: Total CK: 28 U/L — ABNORMAL LOW (ref 49–397)

## 2023-05-22 MED ORDER — SODIUM CHLORIDE 0.9 % IV SOLN
1.0000 g | INTRAVENOUS | Status: DC
  Administered 2023-05-22: 1 g via INTRAVENOUS
  Filled 2023-05-22: qty 10

## 2023-05-22 MED ORDER — SODIUM CHLORIDE 0.9 % IV SOLN
2.0000 g | Freq: Three times a day (TID) | INTRAVENOUS | Status: DC
  Administered 2023-05-22 – 2023-05-25 (×9): 2 g via INTRAVENOUS
  Filled 2023-05-22 (×9): qty 12.5

## 2023-05-22 MED ORDER — AZITHROMYCIN 250 MG PO TABS
500.0000 mg | ORAL_TABLET | Freq: Every day | ORAL | Status: DC
  Administered 2023-05-22: 500 mg via ORAL
  Filled 2023-05-22: qty 2

## 2023-05-22 MED ORDER — KETOROLAC TROMETHAMINE 30 MG/ML IJ SOLN
15.0000 mg | Freq: Once | INTRAMUSCULAR | Status: AC
  Administered 2023-05-22: 15 mg via INTRAVENOUS
  Filled 2023-05-22: qty 1

## 2023-05-22 NOTE — Plan of Care (Signed)
  Problem: Education: Goal: Knowledge of General Education information will improve Description: Including pain rating scale, medication(s)/side effects and non-pharmacologic comfort measures Outcome: Progressing   Problem: Health Behavior/Discharge Planning: Goal: Ability to manage health-related needs will improve Outcome: Progressing   Problem: Clinical Measurements: Goal: Ability to maintain clinical measurements within normal limits will improve Outcome: Progressing   Problem: Activity: Goal: Risk for activity intolerance will decrease Outcome: Not Progressing

## 2023-05-22 NOTE — Progress Notes (Signed)
PROGRESS NOTE  Andre [name] ZOX:[id number] DOB: 1951/01/01   PCP: Andre Gammon, MD  Patient is from: Nursing home  DOA: 05/20/2023 LOS: 2  Chief complaints Chief Complaint  Patient presents with   Altered Mental Status     Brief Narrative / Interim history: 72 year old M with PMH of MM in remission after stem cell transplant and recent CAR-T therapy in 09/2022, hypogammaglobulinemia on monthly IVIG, pancytopenia, cognitive impairment, hypothyroidism, moderate AR and recent hospitalization from 8/17-8/23 for Andre bacteremia for which she was treated with IV cefepime and discharged to nursing home on Cipro presenting with acute mental status change, hypoxemia and fever, and admitted with working diagnosis of severe sepsis.  Reportedly saturating at 83% on RA when EMS arrived.  In ED, febrile 202.6.  Tachycardic to 112.  Normotensive.  UA with moderate Hgb and large LE but negative nitrite and rare bacteria.  CBC, CMP, lipase, ammonia and CT head without acute finding.  CXR with large hiatal hernia and bibasilar atelectasis.  CT angio chest negative for PE or pneumonia but bibasilar atelectasis.  COVID-19, influenza and RSV PCR nonreactive.  EKG with sinus tachycardia.  Cultures obtained.  MRI brain ordered.  Started on broad-spectrum antibiotics and admitted.  Oncology consulted.   Clinically improved except for significant oxygen requirement without respiratory distress.  Blood culture and full RVP negative.  Urine culture with Andre [name].  On cefepime and Zithromax.  Oncology following.    Subjective: Seen and examined earlier this morning.  No major events overnight of this morning.  Feels well.  No complaints.  Sitting on bedside chair.  Getting ready to work with therapy.  On 5 L by nasal cannula.  Patient's daughter at bedside.  Objective: Vitals:   05/21/23 2016 05/22/23 0123 05/22/23 0640 05/22/23 1239  BP: (!) 103/59  (!) 121/58 (!) 93/54  Pulse: 74  62 72   Resp: 18   20  Temp: 99.1 F (37.3 C)  98.2 F (36.8 C) 98 F (36.7 C)  TempSrc: Oral  Oral   SpO2: 93%  95% 94%  Weight:  77.7 kg    Height:        Examination:  GENERAL: No apparent distress.  Nontoxic. HEENT: MMM.  Vision and hearing grossly intact.  NECK: Supple.  No apparent JVD.  RESP:  No IWOB.  Fair aeration bilaterally. CVS:  RRR. Heart sounds normal.  ABD/GI/GU: BS+. Abd soft, NTND.  MSK/EXT:  Moves extremities. No apparent deformity. No edema.  SKIN: no apparent skin lesion or wound NEURO: Awake, alert and oriented appropriately.  No apparent focal neuro deficit. PSYCH: Calm. Normal affect.   Procedures:  None  Microbiology summarized: COVID-19, influenza and RSV PCR nonreactive Blood cultures NGTD Urine culture with Andre [name] Full RVP nonreactive.  Assessment and plan: Principal Problem:   Severe sepsis (HCC) Active Problems:   Acute cystitis without hematuria   Acute metabolic encephalopathy   Hypothyroidism   Thrombocytopenia (HCC)   Multiple myeloma not having achieved remission (HCC)   GERD (gastroesophageal reflux disease)   Acute hypoxemic respiratory failure (HCC)   DNR (do not resuscitate)/DNI(Do Not Intubate)   Moderate-to-severe aortic regurgitation   Macrocytic anemia  Severe sepsis due to Andre UTI: POA.  Had fever, tachycardia, tachypnea, encephalopathy and hypoxic respiratory failure. Urine culture with Andre [name]..  Blood cultures NGTD.  Pro-Cal elevated to 0.61. -Continue cefepime for Andre UTI -Follow culture sensitivity   Acute metabolic encephalopathy: Likely due to the above.  Not on sedating  medication as far as I can tell.  No focal neurodeficit.  CT head, B12, ammonia, VBG and MRI brain unrevealing.  Encephalopathy resolved.  -Reorientation and delirium precaution -Antibiotics as above -Avoid or minimize sedating medications -Continue dysphagia 3 diet.  Acute hypoxemic respiratory  failure: Aspiration pneumonitis?  CT angio chest negative for PE but bibasilar atelectasis and large hiatal hernia.  Currently on 5 L -Wean oxygen as able.  Excellent referred on incentive telemetry -Discontinue Zithromax. -Aspiration precaution -Continue dysphagia 3 diet   Moderate-to-severe aortic regurgitation: Chronic -Outpatient follow-up   GERD/large hiatal hernia -Continue Protonix -Aspiration precaution -Dysphagia 3 diet   Multiple myeloma in remission after stem cell transplant and CAR-T cell therapy in 09/2022 -Appreciate input by oncology  Hypogammaglobulinemia-on monthly IVIG. -Oncology on board.  Checking quantitative immunoglobulin levels  Pancytopenia: Relatively stable. -IV iron per oncology/hematology -Continue monitoring   Hypothyroidism: TSH low at 0.173 (0.015 in 02/2023).  Free T4 normal. -Continue home Synthroid  Physical deconditioning -PT/OT recommended SNF.    Body mass index is 26.85 kg/m.           DVT prophylaxis:  SCDs Start: 05/20/23 2217  Code Status: DNR/DNI Family Communication: Updated patient's daughter at bedside Level of care: Progressive Status is: Inpatient Remains inpatient appropriate because: Severe sepsis due to Andre UTI   Final disposition: SNF? Consultants:  Oncology  55 minutes with more than 50% spent in reviewing records, counseling patient/family and coordinating care.   Sch Meds:  Scheduled Meds:  calcium-vitamin D  1 tablet Oral Q breakfast   dapsone  100 mg Oral Daily   escitalopram  10 mg Oral Daily   ferrous sulfate  325 mg Oral BID WC   levothyroxine  125 mcg Oral Daily   methylphenidate  2.5 mg Oral Daily   pantoprazole  40 mg Oral Daily   valACYclovir  500 mg Oral Daily   Continuous Infusions:  ceFEPime (MAXIPIME) IV 2 g (05/22/23 1415)   metronidazole 500 mg (05/22/23 1421)   PRN Meds:.ondansetron (ZOFRAN) IV  Antimicrobials: Anti-infectives (From admission, onward)    Start      Dose/Rate Route Frequency Ordered Stop   05/22/23 1145  ceFEPIme (MAXIPIME) 2 g in sodium chloride 0.9 % 100 mL IVPB        2 g 200 mL/hr over 30 Minutes Intravenous Every 8 hours 05/22/23 1050     05/22/23 1000  azithromycin (ZITHROMAX) tablet 500 mg  Status:  Discontinued        500 mg Oral Daily 05/22/23 0805 05/22/23 1741   05/22/23 0900  cefTRIAXone (ROCEPHIN) 1 g in sodium chloride 0.9 % 100 mL IVPB  Status:  Discontinued        1 g 200 mL/hr over 30 Minutes Intravenous Every 24 hours 05/22/23 0805 05/22/23 1050   05/21/23 1800  vancomycin (VANCOREADY) IVPB 1500 mg/300 mL  Status:  Discontinued        1,500 mg 150 mL/hr over 120 Minutes Intravenous Every 24 hours 05/20/23 2153 05/22/23 0805   05/21/23 1330  metroNIDAZOLE (FLAGYL) IVPB 500 mg        500 mg 100 mL/hr over 60 Minutes Intravenous Every 12 hours 05/21/23 1217 05/26/23 1329   05/21/23 1330  dapsone tablet 100 mg        100 mg Oral Daily 05/21/23 1228     05/21/23 1330  valACYclovir (VALTREX) tablet 500 mg        500 mg Oral Daily 05/21/23 1228     05/21/23  0200  ceFEPIme (MAXIPIME) 2 g in sodium chloride 0.9 % 100 mL IVPB  Status:  Discontinued        2 g 200 mL/hr over 30 Minutes Intravenous Every 8 hours 05/20/23 2153 05/22/23 0805   05/20/23 1645  ceFEPIme (MAXIPIME) 2 g in sodium chloride 0.9 % 100 mL IVPB        2 g 200 mL/hr over 30 Minutes Intravenous  Once 05/20/23 1638 05/20/23 1809   05/20/23 1645  vancomycin (VANCOREADY) IVPB 1500 mg/300 mL        1,500 mg 150 mL/hr over 120 Minutes Intravenous  Once 05/20/23 1640 05/20/23 1934        I have personally reviewed the following labs and images: CBC: Recent Labs  Lab 05/20/23 1643 05/20/23 2320 05/22/23 0420  WBC 8.9 9.2 4.3  NEUTROABS 6.3 6.4  --   HGB 11.0* 9.5* 9.5*  HCT 33.6* 29.5* 29.8*  MCV 113.9* 113.0* 116.0*  PLT 40* 33* 32*   BMP &GFR Recent Labs  Lab 05/20/23 1643 05/20/23 2320 05/22/23 0420  NA 131* 131* 137  K 3.9 3.7 4.0  CL  97* 98 108  CO2 22 23 23   GLUCOSE 107* 120* 93  BUN 15 14 15   CREATININE 0.96 1.00 0.92  CALCIUM 9.4 8.9 9.2  MG  --  1.9 2.3  PHOS  --   --  2.3*   Estimated Creatinine Clearance: 66.9 mL/min (by C-G formula based on SCr of 0.92 mg/dL). Liver & Pancreas: Recent Labs  Lab 05/20/23 1643 05/20/23 2320 05/22/23 0420  AST 45* 35 29  ALT 40 35 35  ALKPHOS 91 79 66  BILITOT 0.9 0.9 0.7  PROT 6.7 6.1* 5.7*  ALBUMIN 3.9 3.4* 3.0*   Recent Labs  Lab 05/20/23 1643  LIPASE 26   Recent Labs  Lab 05/20/23 1825  AMMONIA 26   Diabetic: No results for input(s): "HGBA1C" in the last 72 hours. No results for input(s): "GLUCAP" in the last 168 hours. Cardiac Enzymes: Recent Labs  Lab 05/22/23 0420  CKTOTAL 28*   No results for input(s): "PROBNP" in the last 8760 hours. Coagulation Profile: No results for input(s): "INR", "PROTIME" in the last 168 hours. Thyroid Function Tests: Recent Labs    05/20/23 2320 05/21/23 1102  TSH 0.173*  --   FREET4  --  1.00   Lipid Profile: No results for input(s): "CHOL", "HDL", "LDLCALC", "TRIG", "CHOLHDL", "LDLDIRECT" in the last 72 hours. Anemia Panel: Recent Labs    05/20/23 2320  VITAMINB12 1,163*  FOLATE 27.7   Urine analysis:    Component Value Date/Time   COLORURINE YELLOW 05/20/2023 1648   APPEARANCEUR HAZY (A) 05/20/2023 1648   APPEARANCEUR Clear 12/22/2015 1614   LABSPEC 1.013 05/20/2023 1648   PHURINE 5.0 05/20/2023 1648   GLUCOSEU NEGATIVE 05/20/2023 1648   GLUCOSEU NEGATIVE 09/06/2017 1040   HGBUR MODERATE (A) 05/20/2023 1648   BILIRUBINUR NEGATIVE 05/20/2023 1648   BILIRUBINUR Negative 12/22/2015 1614   KETONESUR NEGATIVE 05/20/2023 1648   PROTEINUR 30 (A) 05/20/2023 1648   UROBILINOGEN 0.2 09/06/2017 1040   NITRITE NEGATIVE 05/20/2023 1648   LEUKOCYTESUR LARGE (A) 05/20/2023 1648   Sepsis Labs: Invalid input(s): "PROCALCITONIN", "LACTICIDVEN"  Microbiology: Recent Results (from the past 240 hour(s))   Urine Culture (for pregnant, neutropenic or urologic patients or patients with an indwelling urinary catheter)     Status: Abnormal (Preliminary result)   Collection Time: 05/20/23  3:47 AM   Specimen: Urine, Clean Catch  Result  Value Ref Range Status   Specimen Description   Final    URINE, CLEAN CATCH Performed at [facility], [address], [place], [place] [address]    Special Requests   Final    NONE Performed at [facility], [address] [place], [place] [address]    Culture >=100,000 COLONIES/mL Andre [name] (A)  Final   Report Status PENDING  Incomplete  Culture, blood (routine x 2)     Status: None (Preliminary result)   Collection Time: 05/20/23  4:44 PM   Specimen: BLOOD  Result Value Ref Range Status   Specimen Description   Final    BLOOD BLOOD LEFT FOREARM Performed at [facility], [address] [place], [place] [address]    Special Requests   Final    BOTTLES DRAWN AEROBIC AND ANAEROBIC Blood Culture adequate volume Performed at [facility], [address] [place], [place] [address]    Culture   Final    NO GROWTH 2 DAYS Performed at [facility], [address], [place], [place] [address]    Report Status PENDING  Incomplete  Culture, blood (routine x 2)     Status: None (Preliminary result)   Collection Time: 05/20/23  6:25 PM   Specimen: BLOOD  Result Value Ref Range Status   Specimen Description BLOOD SITE NOT SPECIFIED  Final   Special Requests   Final    BOTTLES DRAWN AEROBIC AND ANAEROBIC Blood Culture results may not be optimal due to an excessive volume of blood received in culture bottles   Culture   Final    NO GROWTH 2 DAYS Performed at [facility], [address] [place], [place] [address]    Report Status PENDING  Incomplete  Resp panel by RT-PCR (RSV, Flu A&B, Covid) Peripheral     Status: None   Collection Time: 05/20/23  6:25 PM   Specimen:  Peripheral; Nasal Swab  Result Value Ref Range Status   SARS Coronavirus 2 by RT PCR NEGATIVE NEGATIVE Final    Comment: (NOTE) SARS-CoV-2 target nucleic acids are NOT DETECTED.  The SARS-CoV-2 RNA is generally detectable in upper respiratory specimens during the acute phase of infection. The lowest concentration of SARS-CoV-2 viral copies this assay can detect is 138 copies/mL. A negative result does not preclude SARS-Cov-2 infection and should not be used as the sole basis for treatment or other patient management decisions. A negative result may occur with  improper specimen collection/handling, submission of specimen other than nasopharyngeal swab, presence of viral mutation(s) within the areas targeted by this assay, and inadequate number of viral copies(<138 copies/mL). A negative result must be combined with clinical observations, patient history, and epidemiological information. The expected result is Negative.  Fact Sheet for Patients:  BloggerCourse.com  Fact Sheet for Healthcare Providers:  SeriousBroker.it  This test is no t yet approved or cleared by the [place] FDA and  has been authorized for detection and/or diagnosis of SARS-CoV-2 by FDA under an Emergency Use Authorization (EUA). This EUA will remain  in effect (meaning this test can be used) for the duration of the COVID-19 declaration under Section 564(b)(1) of the Act, 21 U.S.C.section 360bbb-3(b)(1), unless the authorization is terminated  or revoked sooner.       Influenza A by PCR NEGATIVE NEGATIVE Final   Influenza B by PCR NEGATIVE NEGATIVE Final    Comment: (NOTE) The Xpert Xpress SARS-CoV-2/FLU/RSV plus assay is intended  as an aid in the diagnosis of influenza from Nasopharyngeal swab specimens and should not be used as a sole basis for treatment. Nasal washings and aspirates are unacceptable for Xpert Xpress  SARS-CoV-2/FLU/RSV testing.  Fact Sheet for Patients: BloggerCourse.com  Fact Sheet for Healthcare Providers: SeriousBroker.it  This test is not yet approved or cleared by the [place] FDA and has been authorized for detection and/or diagnosis of SARS-CoV-2 by FDA under an Emergency Use Authorization (EUA). This EUA will remain in effect (meaning this test can be used) for the duration of the COVID-19 declaration under Section 564(b)(1) of the Act, 21 U.S.C. section 360bbb-3(b)(1), unless the authorization is terminated or revoked.     Resp Syncytial Virus by PCR NEGATIVE NEGATIVE Final    Comment: (NOTE) Fact Sheet for Patients: BloggerCourse.com  Fact Sheet for Healthcare Providers: SeriousBroker.it  This test is not yet approved or cleared by the [place] FDA and has been authorized for detection and/or diagnosis of SARS-CoV-2 by FDA under an Emergency Use Authorization (EUA). This EUA will remain in effect (meaning this test can be used) for the duration of the COVID-19 declaration under Section 564(b)(1) of the Act, 21 U.S.C. section 360bbb-3(b)(1), unless the authorization is terminated or revoked.  Performed at [facility], [address] [place], [place] [address]   Respiratory (~20 pathogens) panel by PCR     Status: None   Collection Time: 05/21/23  4:32 PM   Specimen: Nasopharyngeal Swab; Respiratory  Result Value Ref Range Status   Adenovirus NOT DETECTED NOT DETECTED Final   Coronavirus 229E NOT DETECTED NOT DETECTED Final    Comment: (NOTE) The Coronavirus on the Respiratory Panel, DOES NOT test for the novel  Coronavirus (2019 nCoV)    Coronavirus HKU1 NOT DETECTED NOT DETECTED Final   Coronavirus NL63 NOT DETECTED NOT DETECTED Final   Coronavirus OC43 NOT DETECTED NOT DETECTED Final   Metapneumovirus NOT DETECTED NOT  DETECTED Final   Rhinovirus / Enterovirus NOT DETECTED NOT DETECTED Final   Influenza A NOT DETECTED NOT DETECTED Final   Influenza B NOT DETECTED NOT DETECTED Final   Parainfluenza Virus 1 NOT DETECTED NOT DETECTED Final   Parainfluenza Virus 2 NOT DETECTED NOT DETECTED Final   Parainfluenza Virus 3 NOT DETECTED NOT DETECTED Final   Parainfluenza Virus 4 NOT DETECTED NOT DETECTED Final   Respiratory Syncytial Virus NOT DETECTED NOT DETECTED Final   Bordetella pertussis NOT DETECTED NOT DETECTED Final   Bordetella Parapertussis NOT DETECTED NOT DETECTED Final   Chlamydophila pneumoniae NOT DETECTED NOT DETECTED Final   Mycoplasma pneumoniae NOT DETECTED NOT DETECTED Final    Comment: Performed at [facility], [address], [place], [place] [address]    Radiology Studies: No results found.    Darwin Rothlisberger T. [place] [facility]  If 7PM-7AM, please contact night-coverage [email or web address] 05/22/2023, 5:45 PM

## 2023-05-22 NOTE — TOC Initial Note (Signed)
Transition of Care [facility]) - Initial/Assessment Note    Patient Details  Name: Andre [name] MRN: [id number] Date of Birth: [birth year]  Transition [facility] Care [facility]) CM/SW Contact:    Larrie Kass, LCSW Phone Number: 05/22/2023, 1:04 PM  Clinical Narrative:                 CSW met with the patient and his daughter to discuss the discharge plan. The pt is from [facility]. CSW discussed SNF rec. The pt reports that he would like to return to [facility]. CSW spoke to Ms Cathlean Cower with [facility]. The patient can return and will not need auth or PASSR number. Per Ms Cathlean Cower, the patient is private pay. She started to contact her when the patient is ready for d/c. She will need an updated FL2. TOC to follow.   Expected Discharge Plan: Skilled Nursing Facility Barriers to Discharge: Continued Medical Work up   Patient Goals and CMS Choice Patient states their goals for this hospitalization and ongoing recovery are:: retrun to [facility] for SNF CMS Medicare.gov Compare Post Acute Care list provided to:: Patient Choice offered to / list presented to : Patient, Adult Children      Expected Discharge Plan and Services In-house Referral: Clinical Social Work     Living arrangements for the past 2 months: Assisted Living Facility                                      Prior Living Arrangements/Services Living arrangements for the past 2 months: Assisted Living Facility Lives with:: Self, Spouse Patient language and need for interpreter reviewed:: Yes Do you feel safe going back to the place where you live?: Yes      Need for Family Participation in Patient Care: No (Comment) Care giver support system in place?: Yes (comment)   Criminal Activity/Legal Involvement Pertinent to Current Situation/Hospitalization: No - Comment as needed  Activities of Daily Living Home Assistive Devices/Equipment: Dan Humphreys (specify type) ADL Screening (condition at  time of admission) Patient's cognitive ability adequate to safely complete daily activities?: Yes Is the patient deaf or have difficulty hearing?: No Does the patient have difficulty seeing, even when wearing glasses/contacts?: No Does the patient have difficulty concentrating, remembering, or making decisions?: Yes Patient able to express need for assistance with ADLs?: Yes Does the patient have difficulty dressing or bathing?: No Independently performs ADLs?: No Communication: Independent Dressing (OT): Needs assistance Is this a change from baseline?: Pre-admission baseline Grooming: Needs assistance Is this a change from baseline?: Pre-admission baseline Feeding: Independent Bathing: Needs assistance Is this a change from baseline?: Pre-admission baseline Toileting: Needs assistance Is this a change from baseline?: Pre-admission baseline In/Out Bed: Needs assistance Is this a change from baseline?: Pre-admission baseline Walks in Home: Needs assistance Is this a change from baseline?: Pre-admission baseline Does the patient have difficulty walking or climbing stairs?: Yes Weakness of Legs: Both Weakness of Arms/Hands: None  Permission Sought/Granted                  Emotional Assessment Appearance:: Appears stated age Attitude/Demeanor/Rapport: Gracious, Engaged Affect (typically observed): Accepting Orientation: : Oriented to Self, Oriented to Place   Psych Involvement: No (comment)  Admission diagnosis:  Delirium [R41.0] Sepsis without acute organ dysfunction (HCC) [A41.9] Sepsis, due to unspecified organism, unspecified whether acute organ dysfunction present [facility]) [A41.9] Patient Active Problem List  Diagnosis Date Noted   DNR (do not resuscitate)/DNI(Do Not Intubate) 05/20/2023   Severe sepsis (HCC) 05/20/2023   Acute cystitis without hematuria 05/20/2023   Acute metabolic encephalopathy 05/20/2023   Moderate-to-severe aortic regurgitation 05/20/2023    Macrocytic anemia 05/20/2023   Acute hypoxemic respiratory failure (HCC) 05/05/2023   Reduced mobility 10/13/2022   Self-care deficit 10/13/2022   Physical deconditioning 10/02/2022   History of engineered cell therapy infusion 09/26/2022   Meteorism 02/09/2022   Memory problem 08/30/2021   Hemorrhoids 11/20/2019   Neuropathy due to chemotherapeutic drug (HCC) 09/01/2019   Dry skin dermatitis 08/16/2016   Peripheral edema 04/26/2016   Bone marrow transplant status (HCC) 03/14/2016   Debility 02/26/2016   Back pain 02/26/2016   Patient in clinical research study 01/10/2016   GERD (gastroesophageal reflux disease)    Weakness 12/22/2015   General weakness    Multiple myeloma not having achieved remission (HCC)    Chronic anemia    Constipation    Thrombocytopenia (HCC) 11/05/2015   Actinic keratosis 07/08/2015   Raynaud's phenomenon 09/17/2013   Cough 01/10/2013   Vitamin D deficiency 09/30/2012   Hypothyroidism 09/30/2012   Hypogonadism male 09/30/2012   Fatigue 09/27/2012   Insomnia 09/27/2012   Reactive depression (situational) 09/27/2012   Pancytopenia (HCC) 03/27/2012   HYPERLIPIDEMIA 05/30/2010   Anemia of chronic disease 05/30/2010   Asthma 05/30/2010   SCHATZKI'S RING 05/30/2010   Peptic ulcer disease 05/30/2010   HIATAL HERNIA 05/30/2010   SLEEP APNEA, MILD 05/30/2010   TRANSAMINASES, SERUM, ELEVATED 05/30/2010   PCP:  [name], MD Pharmacy:   [facility] [phone] Gin[place] Otto, [place] - [address] AT [facility] [address] [place] [address] Phone: [phone] Fax: [phone]  [facility] [address] [place] [address] Phone: [phone] Fax: [phone]  [facility] [address] [place] [address] Phone: [phone] Fax: [phone]  [facility] [place] [place] [address] [place] [address] Phone: [phone] Fax: [phone]     Social Determinants of Health (SDOH) Social History: SDOH Screenings   Food Insecurity: No Food Insecurity (05/21/2023)  Housing: Low Risk  (05/21/2023)  Transportation Needs: No Transportation Needs (05/21/2023)  Utilities: Not At Risk (05/21/2023)  Depression (PHQ2-9): Low Risk  (06/08/2022)  Tobacco Use: Medium Risk (05/20/2023)   SDOH Interventions:     Readmission Risk Interventions     No data to display

## 2023-05-22 NOTE — Progress Notes (Incomplete)
MOA, dose, duration, AE,   Dapsone: pneumocyst pneumonia ppx [Pneumocystis jirovecii (yeast-like fungus )] bc of  immunodeficiencies (eg, severe combined immunodeficiency, idiopathic CD4 T-lymphocytopenia, hyper-immunoglobulin [Ig]M syndrome)  Primary tx: bacrim but given pt AE to rash --> dapsone - dose: 100 mg orally once daily or 50 mg orally twice daily -duration  -indefinite  - AE: Methemoglobinemia,Erythema, xeroderma  [jaundiace]   Escitalopram [depression 2014] - dose: 10 mg every day [MDD 20mg ] -duration: indefinite  - AE: prolonged qtc, Hemorrhage, SI, <--BBW   Valacyclovir 500 mg [fluids to prevent crystalization in kidney] -moa: converted to acyclovir by intestinal and hepatic metabolism to inhibit dna replication in virus - dose: 500 mg for transmission reduction -duration: indefinite  - AE: aki, neurotoxicity, Thrombotic thrombocytopenic purpura []    Introduction: [id number]  72 yo M presented to the hospital on 9/1 for acute mental status change, hypoxemia and fever, and admitted with working diagnosis of severe sepsis   History of present illness: Had a recent hospitalization from 8/17-8/23 for Pseudomonas bacteremia for which she was treated with IV cefepime and discharged to nursing home on Cipro   Past medical history/allergies/family history pertinent to current illness:  -PMH of MM [cancer in WBC] in remission after stem cell transplant and recent CAR-T [Carvykti= modifying a patient's T cells to express a chimeric antigen receptor (CAR) that targets the B-cell maturation antigen (BCMA) on multiple myeloma cell] therapy in 09/2022, hypogammaglobulinemia [not enough gamma globulins are produced in the blood= low antibody] on monthly IVIG, pancytopenia, cognitive impairment, hypothyroidism, moderate AR  --> cause immunosuppression [look at neutrophils]  PTA meds: - tylenol - dapsone -levothyroxine 125 -valacyclovir 500 mg [Taking inpatient]  Social hx: -  quit smoking about 54 years ago.   -He has never used smokeless tobacco.  -He reports that he does not drink alcohol and does not use drugs.      Problem based assessment and plan:  -sepsis wo acute organ dysfunction secondary to pseudomonas UTI:  A. Assessment: Physical exam/relevant labs, diagnostic testing/imaging:  - T: mild fever 99.1 on admission trending downwards 97.5 last night at 2300 97.6 - HR: in the low 70s was down at 58 last night 68 BP: was 148/81 on admission but trended down to as low as 93/54 back up to 130/62  102/59  - covid - - flu - - rsv - - CT scan: - for PE and pneumonia - bcx: no growth yet -UA: >100,000 colonies of pseudomonas -procalcitonin: 0.61 [no systemic infection]   Hospital meds - metronidazole 9/2 >> 9/4 - vancomycin 1500 9/1 >>9/3 -cefepime 2g q8h 9/1 >>  B. Plan: -continue cefepime 2g q8h <-- IT DOES  [Usually a bit longer 5-7 days for beta-lactams] = pansensitive [sensitive to all testing]  Pt said he feels better but the breathing is not improved  [Bacteremia vs uti duration]  Cefepime DOSING - acute hypoxia respiratory failure:  A. Assessment: Physical exam/relevant labs, diagnostic testing/imaging: - CXR: large hiatal hernia. There is bibasilar atelectasis. [partial collapse or incomplete expansion of the lower parts]  -on 5L [place]  B. Plan: -d/c azithromycin [no pneumonia] 9/3>9/3

## 2023-05-22 NOTE — Evaluation (Signed)
Occupational Therapy Evaluation Patient Details Name: Andre [name] MRN: [id number] DOB: Sep 20, 1950 Today's Date: 05/22/2023   History of Present Illness 72 yr old male admitted with AMS, fever, hypoxia, UTI, sepsis. Hx of multiple myeloma s/p CAR-T therapy 09/2022, persistent pancytopenia, respiratory failure, pseudomonas bacterermia, fall   Clinical Impression   Pt is currently presenting with the below listed deficits which compromise his ADL performance and overall functional independence (see OT problem list). As such, he currently requires assist for tasks, including lower body dressing, sit to stand using a RW, and toileting. Of note, he is currently requiring 5L O2 via high flow nasal cannula. He will benefit from further OT services to maximize his independence with self-care tasks and to decrease the risk for restricted participation in meaningful activities. Patient will benefit from continued inpatient follow up therapy, <3 hours/day.       If plan is discharge home, recommend the following: A little help with walking and/or transfers;Assistance with cooking/housework;Direct supervision/assist for medications management;A lot of help with bathing/dressing/bathroom    Functional Status Assessment  Patient has had a recent decline in their functional status and demonstrates the ability to make significant improvements in function in a reasonable and predictable amount of time.  Equipment Recommendations  None recommended by OT    Recommendations for Other Services       Precautions / Restrictions Precautions Precautions: Fall Precaution Comments: monitor O2-on HFNC Restrictions Weight Bearing Restrictions: No      Mobility Bed Mobility    General bed mobility comments: pt was received seated in the recliner    Transfers Overall transfer level: Needs assistance Equipment used: Rolling walker (2 wheels) Transfers: Sit to/from Stand Sit to Stand: Min assist            General transfer comment: Pt was instructed on increasing B LE base of support and pushing up from the chair with a least 1 upper extremity      Balance     Sitting balance-Leahy Scale: Good         Standing balance comment: CGA with RW       ADL either performed or assessed with clinical judgement   ADL Overall ADL's : Needs assistance/impaired Eating/Feeding: Independent;Sitting Eating/Feeding Details (indicate cue type and reason): based on clinical judgement Grooming: Set up;Sitting Grooming Details (indicate cue type and reason): simulated         Upper Body Dressing : Set up;Sitting Upper Body Dressing Details (indicate cue type and reason): simulated at chair level Lower Body Dressing: Moderate assistance;Sit to/from stand   Toilet Transfer: Minimal assistance;Ambulation;Rolling walker (2 wheels) Toilet Transfer Details (indicate cue type and reason): at bathroom level, based on clinical judgement                 Vision   Additional Comments: He correctly read the time depicted on the wall clock.            Pertinent Vitals/Pain Pain Assessment Pain Assessment: No/denies pain     Extremity/Trunk Assessment Upper Extremity Assessment Upper Extremity Assessment: Overall WFL for tasks assessed (B UE AROM WFL. B UE grip strength 4/5)   Lower Extremity Assessment Lower Extremity Assessment: Generalized weakness (B LE AROM WFL)       Communication Communication Following commands: Follows one step commands consistently Cueing Techniques: Verbal cues   Cognition Arousal: Alert Behavior During Therapy: WFL for tasks assessed/performed             General Comments: Oriented to  person, place, month, and year. Able to follow 1 step commands without difficulty.     \\           Home Living Family/patient expects to be discharged to:: Skilled nursing facility Living Arrangements: Spouse/significant other        Home Equipment:  Rolling Walker (2 wheels)   Additional Comments: Prior to [place] of this year, he was living at home with his spouse. Since then, he has been in SNF rehab and an ALF.      Prior Functioning/Environment Prior Level of Function : Needs assist             Mobility Comments: Pt reported use of a RW for ambulation. ADLs Comments: At the ALF, he reported being modified independent to independent with bathing, dressing, and toileting.        OT Problem List: Decreased strength;Decreased activity tolerance;Impaired balance (sitting and/or standing)      OT Treatment/Interventions: Self-care/ADL training;Therapeutic exercise;Patient/family education;Balance training;DME and/or AE instruction;Energy conservation;Therapeutic activities    OT Goals(Current goals can be found in the care plan section) Acute Rehab OT Goals OT Goal Formulation: With patient Time For Goal Achievement: 06/05/23 Potential to Achieve Goals: Good ADL Goals Pt Will Perform Grooming: with supervision;standing Pt Will Perform Lower Body Dressing: with supervision;sit to/from stand Pt Will Transfer to Toilet: with supervision;ambulating;grab bars Pt Will Perform Toileting - Clothing Manipulation and hygiene: with supervision;sit to/from stand  OT Frequency: Min 1X/week       AM-PAC OT "6 Clicks" Daily Activity     Outcome Measure Help from another person eating meals?: None Help from another person taking care of personal grooming?: A Little Help from another person toileting, which includes using toliet, bedpan, or urinal?: A Little Help from another person bathing (including washing, rinsing, drying)?: A Lot Help from another person to put on and taking off regular upper body clothing?: A Little Help from another person to put on and taking off regular lower body clothing?: A Lot 6 Click Score: 17   End of Session Equipment Utilized During Treatment: Oxygen;Gait belt;Rolling walker (2 wheels) Nurse  Communication: Mobility status  Activity Tolerance: Patient tolerated treatment well Patient left: in bed;with call bell/phone within reach;with chair alarm set;with family/visitor present                   Time: 1610-9604 OT Time Calculation (min): 24 min Charges:  OT General Charges $OT Visit: 1 Visit OT Evaluation $OT Eval Moderate Complexity: 1 Mod OT Treatments $Therapeutic Activity: 8-22 mins    Reuben Likes, OTR/L 05/22/2023, 1:55 PM

## 2023-05-22 NOTE — Progress Notes (Signed)
IP PROGRESS NOTE  Subjective:   Andre. Andre [name] complains of a headache this morning.  He denies dyspnea.  No other complaint.. . Objective: Vital signs in last 24 hours: Blood pressure (!) 121/58, pulse 62, temperature 98.2 F (36.8 C), temperature source Oral, resp. rate 18, height 5\' 7"  (1.702 m), weight 171 lb 6.4 oz (77.7 kg), SpO2 95%.  Intake/Output from previous day: 09/02 0701 - 09/03 0700 In: 2230.3 [P.O.:1310; I.V.:320.3; IV Piggyback:600] Out: 2375 [Urine:2375]  Physical Exam:  HEENT: No thrush, no bleeding Lungs: Decreased breath sounds with end inspiratory rhonchi at the lower posterior chest bilaterally, no respiratory distress Cardiac: Regular rate and rhythm Abdomen: No hepatosplenomegaly Extremities: No leg edema Neurologic: Alert, oriented to place and diagnosis.   Lab Results: Recent Labs    05/20/23 2320 05/22/23 0420  WBC 9.2 4.3  HGB 9.5* 9.5*  HCT 29.5* 29.8*  PLT 33* 32*    BMET Recent Labs    05/20/23 2320 05/22/23 0420  NA 131* 137  K 3.7 4.0  CL 98 108  CO2 23 23  GLUCOSE 120* 93  BUN 14 15  CREATININE 1.00 0.92  CALCIUM 8.9 9.2    No results found for: "CEA1", "CEA", "WJX914", "CA125"  Studies/Results: Andre BRAIN WO CONTRAST  Result Date: 05/21/2023 CLINICAL DATA:  Delirium EXAM: MRI HEAD WITHOUT CONTRAST TECHNIQUE: Multiplanar, multiecho pulse sequences of the brain and surrounding structures were obtained without intravenous contrast. COMPARISON:  MRI June 2024. FINDINGS: Brain: No acute infarction, hemorrhage, hydrocephalus, extra-axial collection or mass lesion. Moderate for age T2/FLAIR hyperintensities the white matter, nonspecific but compatible with chronic microvascular ischemic disease. Cerebral atrophy. Remote cerebellar infarct. Vascular: Major arterial flow voids are maintained at the skull base. Skull and upper cervical spine: Heterogeneous bone marrow. Sinuses/Orbits: Clear sinuses.  No acute orbital findings. Other: No  mastoid effusions. IMPRESSION: 1. No acute abnormality. 2. Chronic microvascular ischemic disease and remote cerebellar infarct. 3. Heterogeneous bone marrow, compatible with reported history multiple myeloma. Electronically Signed   By: Feliberto Harts M.D.   On: 05/21/2023 15:59   CT Angio Chest PE W and/or Wo Contrast  Result Date: 05/20/2023 CLINICAL DATA:  Pulmonary embolism (PE) suspected, high prob. Found altered. Responsive to pain. EXAM: CT ANGIOGRAPHY CHEST WITH CONTRAST TECHNIQUE: Multidetector CT imaging of the chest was performed using the standard protocol during bolus administration of intravenous contrast. Multiplanar CT image reconstructions and MIPs were obtained to evaluate the vascular anatomy. RADIATION DOSE REDUCTION: This exam was performed according to the departmental dose-optimization program which includes automated exposure control, adjustment of the mA and/or kV according to patient size and/or use of iterative reconstruction technique. CONTRAST:  75mL OMNIPAQUE IOHEXOL 350 MG/ML SOLN COMPARISON:  05/05/2023 FINDINGS: Cardiovascular: No filling defects in the pulmonary arteries to suggest pulmonary emboli. Heart is enlarged. Coronary artery and aortic atherosclerosis. No aneurysm. Mediastinum/Nodes: No mediastinal, hilar, or axillary adenopathy. Trachea and esophagus are unremarkable. Thyroid unremarkable. Large hiatal hernia. Lungs/Pleura: Elevation and eventration of the right hemidiaphragm. Bibasilar atelectasis. No effusions. Upper Abdomen: No acute findings. Small layering gallstone within the gallbladder. Musculoskeletal: Chest wall soft tissues are unremarkable. Multiple old healing bilateral rib fractures. Chronic mild T12 compression fracture, stable. Chronic patchy lucencies throughout the osseous structures, unchanged. Review of the MIP images confirms the above findings. IMPRESSION: No evidence of pulmonary embolus. Cardiomegaly, coronary artery disease. Bibasilar  atelectasis. Large hiatal hernia. Stable chronic patchy lucencies throughout the osseous structures likely related to multiple myeloma. No acute bony abnormality. Aortic Atherosclerosis (ICD10-I70.0).  Electronically Signed   By: Charlett Nose M.D.   On: 05/20/2023 20:16   CT Head Wo Contrast  Result Date: 05/20/2023 CLINICAL DATA:  Found altered prior to arrival. Mental status change. Responsive to pain. EXAM: CT HEAD WITHOUT CONTRAST TECHNIQUE: Contiguous axial images were obtained from the base of the skull through the vertex without intravenous contrast. RADIATION DOSE REDUCTION: This exam was performed according to the departmental dose-optimization program which includes automated exposure control, adjustment of the mA and/or kV according to patient size and/or use of iterative reconstruction technique. COMPARISON:  CT head 04/20/2023 FINDINGS: Brain: No intracranial hemorrhage, mass effect, or evidence of acute infarct. No hydrocephalus. No extra-axial fluid collection. Age-commensurate cerebral atrophy and ill-defined hypoattenuation within the cerebral white matter consistent with chronic small vessel ischemic disease. Chronic left cerebellar infarct. Vascular: No hyperdense vessel. Intracranial arterial calcification. Skull: No fracture. Multiple lucencies are present throughout the calvarium, similar to prior Sinuses/Orbits: No acute finding. Paranasal sinuses and mastoid air cells are well aerated. Other: None. IMPRESSION: 1. Unchanged CT head from 04/20/2023. No acute intracranial abnormality. Electronically Signed   By: Minerva Fester M.D.   On: 05/20/2023 17:32   DG Chest Portable 1 View  Result Date: 05/20/2023 CLINICAL DATA:  Hypoxia. Concern for pneumonia. Earlier today was at baseline but found altered prior to arrival. EXAM: PORTABLE CHEST 1 VIEW COMPARISON:  Chest radiographs 05/06/2023, 05/05/2023, 01/11/2023; CT chest, abdomen, and pelvis 05/05/2023 FINDINGS: There is again moderate  elevation of the right hemidiaphragm. Cardiac silhouette is at the upper limits of normal size for AP technique. Retrocardiac opacity is seen corresponding to the known large hiatal hernia better seen on prior CT. Mild bibasilar linear subsegmental atelectasis is similar to prior. No pneumothorax. Old healed posterior right rib fractures. IMPRESSION: 1. Mild bibasilar linear subsegmental atelectasis is similar to prior. 2. Large hiatal hernia. Electronically Signed   By: Neita Garnet M.D.   On: 05/20/2023 17:32    Medications: I have reviewed the patient's current medications.  Assessment/Plan: Multiple myeloma- IgG lambda  bortezomib (subcutaneously), lenalidomide, and dexamethasone, with repeat bone marrow biopsy May of 2012 showing 10% plasmacytosis   (2) high-dose chemotherapy with BCNU and melphalan at [facility], followed by stem cell rescue July of 2012   (3) on zoledronic acid started December of 2012, initially monthly, currently given every 3 months, most recent dose  12/07/2015   (4) low-dose lenalidomide resumed April 2013, interrupted several times.  Resumed again on 02/19/2013, ata dose of 5 mg daily, 21 days on and 7 days off, later further reduced to 7 days on, 7 days off   (5) CNS symptoms and abnormal brain MRI extensively evaluated by neurology with no definitive diagnosis established   (6) rising M spike noted June 2014 but did not meet criteria for carfilzomib study   (7) on lenalidomide 25 mg daily, 14 days on, 7 days off, starting 04/18/2013, interrupted December 2014 because of rash;             (a) resumed January 2015 at 10 mg/ day at 21 days on/ 7 days off             (b) starting 08/18/2014 decreased to 10 mg/ day 14 days on, 7 days off because of cytopenias             (c) as of February 2016 was on 5 mg daily 7 days on 7 days off             (  d) lenalidomide discontinued December 2016 with evidence of disease progression   (8) transient global amnesia 05/29/2015,  resolved without intervention   (9) starting PVD 10/25/2015 w ASA 325 thromboprophylaxis, valacyclovir prophylaxis, last dose 12/17/2015             (a) pomalidomide 4 mg/d days 1-14             (b) bortezomib sQ days 2,5,9,12 of each 21 day cycle             (c) dexamethasome 20 mg two days a week             (d) dexamethasone bortezomib and pomalidomide discontinued late December 2018 with poor tolerance   (10) metapneumovirus pneumonia April 2017             (a) completing course of steroids and week of bactrim mid April 2017   (11) status post second autologous transplant at [facility] 02/04/2016(preparatory regimen melphalan 200 mg/m)             (a) received twelve-month vaccinations 03/14/2017 (DPT, Haemophilus, Pneumovax 13, polio)             (b) 14 months injections 05/04/2016 include DTaP, Hib conjugate, HepB energex B 20 mcg/ml, Prevnar 13             (c) 24 month vaccines due at [facility] June 2019   (12) maintenance therapy started November 2017, consisting of             (a) bortezomib 1.3 mg/M2 every 14 days, first dose 07/27/2016             (b) pomalidomide 1 mg days 1-21 Q28 days, started 07/19/2016             (c) zolendronate monthly started 07/27/2016 (previously Q 3 months) however patient unable to tolerate, and changed back to q3 months in April, 2018             (d) Bortezomib changed to monthly as of June 2018 because of tolerance issues, however discontinued after 09/14/2017 dose because of a rise in his M spike             (e) pomalidomide held after 10/18/2017 in preparation for possible study at [facility] (venetoclax)--never resumed             (f) with numbers actually improved off treatment, resumed every 2-week bortezomib 12/25/2017             (g) changed to every 3-week bortezomib as of 03/17/2019             (h) briefly on weekly treatments times 22 October 2020 due to increase in M spike             (I) maintenance therapy discontinued with evidence of progression    (13) bortezomib/daratumumab/dexamethasone started 12/20/2020.             (a) Decadron dose dropped to 10 mg day of and day following treatment starting May 2022             (b) day 8 bortezomib omitted beginning with the June cycle per patient preference             (C) treatment changed to monthly daratumumab/Velcade/Decadron beginning 06/27/2021-patient decision (14) weekly Velcade/Cytoxan beginning 11/14/2021 (15) changed to Cytoxan/carfilzomib/Decadron 12/19/2021-treatment placed on hold 05/15/2022, carfilzomib 05/24/2022 and 05/31/2022 16.  Leukocyte a pheresis procedure at [facility] 06/15/2022 17.  Treatment resumed with Cytoxan/carfilzomib/Decadron 06/28/2022, last given  09/06/2022 18.  CAR-T 09/26/2022 with Carvykti 19.  CRS and ICANS following CAR-T therapy, status post a Decadron taper completed 10/08/2022 20.  Presentation to the emergency room 10/30/2022 with failure to thrive and a fall  21.  Pancytopenia secondary to multiple myeloma and CAR-T therapy-improved 22.  Bone marrow biopsy 11/13/2022-mildly hypocellular bone marrow with relative erythroid hyperplasia and decreased megakaryocytes; no plasma cells identified by differential count or CD138 immunohistochemical stain; flow cytometry negative for a clonal plasma cell population.  The bone marrow biopsy 05/10/2023-mildly hypercellular marrow with mild decrease in megakaryocytes, plasma cells not increased, absent iron stores 23.  Cognitive impairment predating CAR-T therapy and worsened following CAR-T therapy 24.  Admission 05/05/2023 with Pseudomonas sepsis CTs chest, abdomen, and pelvis-right lung base atelectasis, no infiltrate 25.  Admission 05/20/2023 with hypoxia, altered mental status, and fever Infectious disease evaluation on hospital admission consistent with a urinary tract infection   Andre [name] has multiple myeloma.  He underwent CAR-T therapy in January.  He is in remission from myeloma, but has persistent pancytopenia.   He has hypogammaglobulinemia and receives monthly IVIG therapy at the cancer center.  He was admitted 05/05/2023 Pseudomonas bacteremia/sepsis.  No clear source for infection was identified.  He completed an outpatient course of ciprofloxacin.   He has persistent pancytopenia following CAR-T therapy.  He had a bone marrow biopsy 05/10/2023.  No evidence of myeloma.  Her stores were diminished.  Cytogenetics and a myeloma FISH panel are pending.  Andre [name] was readmitted on 05/20/2023 with altered mental status, hypoxia, and fever.  Cultures remain negative to date.  He continues to require high level nasal cannula oxygen support.  No pneumonia was identified on the admission chest CT.   Recommendations: Continue antibiotics, follow-up urine and blood cultures Check quantitative immunoglobulin levels Wean oxygen, management of respiratory failure per the medical service Transfuse platelets for bleeding or a count of less than 10,000 Oncology will continue following him in the hospital and outpatient follow-up will be scheduled at the Cancer center.   LOS: 2 days   Thornton Papas, MD   05/22/2023, 7:18 AM

## 2023-05-22 NOTE — Progress Notes (Addendum)
Physical Therapy Treatment Patient Details Name: Andre [name] MRN: [id number] DOB: 09/16/1951 Today's Date: 05/22/2023   History of Present Illness 72 yo male admitted from SNF with AMS, fever, hypoxia, UTI, sepsis. Hx of multiple myeloma s/p CAR-T therapy 09/2022, persistent pancytopenia, respiratory failure, pseudomonas bacterermia, fall    PT Comments  Pt agreeable to working with therapy. Family present during session. Improved mobility and strength on today. Remained on 6L HFNC O2 during session. Dyspnea 3/4 and fatigue with ambulation. O2 96% on 6L once repositioned back in recliner. Placed pt back on wall O2. Fall risk 2* poor safety awareness. Patient will benefit from continued inpatient follow up therapy, <3 hours/day     If plan is discharge home, recommend the following: Assistance with cooking/housework;Direct supervision/assist for medications management;Direct supervision/assist for financial management;Assist for transportation;A little help with walking and/or transfers;A little help with bathing/dressing/bathroom   Can travel by private vehicle     Yes  Equipment Recommendations  None recommended by PT    Recommendations for Other Services       Precautions / Restrictions Precautions Precautions: Fall Precaution Comments: monitor O2-on HFNC Restrictions Weight Bearing Restrictions: No     Mobility  Bed Mobility               General bed mobility comments: oob in recliner    Transfers Overall transfer level: Needs assistance Equipment used: Rolling walker (2 wheels) Transfers: Sit to/from Stand Sit to Stand: Min assist           General transfer comment: Assist to rise, steady, control descent. Pt attempted to sit before safely positioned in front of recliner. Cues for safety, hand placement.    Ambulation/Gait Ambulation/Gait assistance: Min assist Gait Distance (Feet): 75 Feet Assistive device: Rolling walker (2 wheels) Gait  Pattern/deviations: Step-through pattern, Decreased stride length       General Gait Details: Cues for safety. Assist to steady pt throughout distance. Intermittent veering into objects in hallway. Remained on [place] 6L O2, dyspnea 3/4. Fatigued towards end of walk.   Stairs             Wheelchair Mobility     Tilt Bed    Modified Rankin (Stroke Patients Only)       Balance Overall balance assessment: Needs assistance         Standing balance support: Reliant on assistive device for balance, Bilateral upper extremity supported, During functional activity Standing balance-Leahy Scale: Poor                              Cognition Arousal: Alert Behavior During Therapy: Flat affect   Area of Impairment: Memory, Problem solving                     Memory: Decreased short-term memory       Problem Solving: Requires verbal cues General Comments: per family some baseline short term memory deficits        Exercises      General Comments        Pertinent Vitals/Pain Pain Assessment Pain Assessment: No/denies pain    Home Living                          Prior Function            PT Goals (current goals can now be found in the care plan section) Progress towards  PT goals: Progressing toward goals    Frequency    Min 1X/week      PT Plan      Co-evaluation              AM-PAC PT "6 Clicks" Mobility   Outcome Measure  Help needed turning from your back to your side while in a flat bed without using bedrails?: A Little Help needed moving from lying on your back to sitting on the side of a flat bed without using bedrails?: A Lot Help needed moving to and from a bed to a chair (including a wheelchair)?: A Little Help needed standing up from a chair using your arms (e.g., wheelchair or bedside chair)?: A Little Help needed to walk in hospital room?: A Little Help needed climbing 3-5 steps with a railing? :  Total 6 Click Score: 15    End of Session Equipment Utilized During Treatment: Gait belt;Oxygen Activity Tolerance: Patient limited by fatigue;Patient tolerated treatment well Patient left: in chair;with call bell/phone within reach;with family/visitor present   PT Visit Diagnosis: Difficulty in walking, not elsewhere classified (R26.2);Muscle weakness (generalized) (M62.81);History of falling (Z91.81)     Time: 3086-5784 PT Time Calculation (min) (ACUTE ONLY): 25 min  Charges:    $Gait Training: 23-37 mins PT General Charges $$ ACUTE PT VISIT: 1 Visit                         Faye Ramsay, PT Acute Rehabilitation  Office: [phone]

## 2023-05-23 ENCOUNTER — Encounter (HOSPITAL_COMMUNITY): Payer: Self-pay | Admitting: Internal Medicine

## 2023-05-23 ENCOUNTER — Inpatient Hospital Stay (HOSPITAL_COMMUNITY): Payer: Medicare Other

## 2023-05-23 DIAGNOSIS — R6520 Severe sepsis without septic shock: Secondary | ICD-10-CM | POA: Diagnosis not present

## 2023-05-23 DIAGNOSIS — A419 Sepsis, unspecified organism: Secondary | ICD-10-CM | POA: Diagnosis not present

## 2023-05-23 DIAGNOSIS — G9341 Metabolic encephalopathy: Secondary | ICD-10-CM | POA: Diagnosis not present

## 2023-05-23 DIAGNOSIS — C9000 Multiple myeloma not having achieved remission: Secondary | ICD-10-CM | POA: Diagnosis not present

## 2023-05-23 LAB — RENAL FUNCTION PANEL
Albumin: 2.9 g/dL — ABNORMAL LOW (ref 3.5–5.0)
Anion gap: 8 (ref 5–15)
BUN: 21 mg/dL (ref 8–23)
CO2: 25 mmol/L (ref 22–32)
Calcium: 8.9 mg/dL (ref 8.9–10.3)
Chloride: 101 mmol/L (ref 98–111)
Creatinine, Ser: 0.91 mg/dL (ref 0.61–1.24)
GFR, Estimated: >60 mL/min (ref >60)
Glucose, Bld: 97 mg/dL (ref 70–99)
Phosphorus: 2.4 mg/dL — ABNORMAL LOW (ref 2.5–4.6)
Potassium: 3.7 mmol/L (ref 3.5–5.1)
Sodium: 134 mmol/L — ABNORMAL LOW (ref 135–145)

## 2023-05-23 LAB — URINE CULTURE: Culture: >=100000 — AB

## 2023-05-23 LAB — CBC
HCT: 28 % — ABNORMAL LOW (ref 39.0–52.0)
Hemoglobin: 8.9 g/dL — ABNORMAL LOW (ref 13.0–17.0)
MCH: 36.8 pg — ABNORMAL HIGH (ref 26.0–34.0)
MCHC: 31.8 g/dL (ref 30.0–36.0)
MCV: 115.7 fL — ABNORMAL HIGH (ref 80.0–100.0)
Platelets: 35 10*3/uL — ABNORMAL LOW (ref 150–400)
RBC: 2.42 MIL/uL — ABNORMAL LOW (ref 4.22–5.81)
RDW: 19.2 % — ABNORMAL HIGH (ref 11.5–15.5)
WBC: 2.4 10*3/uL — ABNORMAL LOW (ref 4.0–10.5)
nRBC: 0 % (ref 0.0–0.2)

## 2023-05-23 LAB — IGG, IGA, IGM
IgA: <5 mg/dL — ABNORMAL LOW (ref 61–437)
IgG (Immunoglobin G), Serum: 353 mg/dL — ABNORMAL LOW (ref 603–1613)
IgM (Immunoglobulin M), Srm: <5 mg/dL — ABNORMAL LOW (ref 15–143)

## 2023-05-23 LAB — MAGNESIUM: Magnesium: 2.1 mg/dL (ref 1.7–2.4)

## 2023-05-23 MED ORDER — IMMUNE GLOBULIN (HUMAN) 10 GM/100ML IV SOLN
30.0000 g | Freq: Once | INTRAVENOUS | Status: AC
  Administered 2023-05-23: 30 g via INTRAVENOUS
  Filled 2023-05-23 (×2): qty 300

## 2023-05-23 MED ORDER — ENSURE ENLIVE PO LIQD
237.0000 mL | ORAL | Status: DC
  Administered 2023-05-23 – 2023-05-24 (×2): 237 mL via ORAL

## 2023-05-23 NOTE — Plan of Care (Signed)

## 2023-05-23 NOTE — Progress Notes (Signed)
[facility] PROGRESS NOTE    Progress Note  Andre [name]  GLO:[id number] DOB: Feb 27, 1951 DOA: 05/20/2023 PCP: [name], MD     Brief Narrative:   Andre [name] is an 72 y.o. male past medical history of multiple myeloma in remission after stem cell transplant and recent CAR-T therapy in January 2024, hypogammaglobulinemia on monthly IVIG, pancytopenia, cognitive impairment recently discharged from the hospital on 05/11/2023 for Pseudomonas bacteremia for which he was discharged on Cipro comes in for altered mental status hypoxia and fever, likely due to sepsis satting 83% on room air.  CT angio of the chest was negative for PE or pneumonia.  COVID-19 influenza and SARS-CoV-2 PCR were negative.  Started on broad-spectrum antibiotics and oncology was consulted.  Grew Pseudomonas currently on cefepime and Zithromax  Assessment/Plan:   Severe sepsis (HCC) possibly due to recurrent Pseudomonas UTI: Urine cultures grew Pseudomonas, blood cultures have been negative till date. Continue IV cefepime, discontinue Flagyl. Pseudomonas pansensitive. Blood cultures remain negative to date.  Acute metabolic encephalopathy: Likely due to infectious etiology. CT of the head, B12 VBG and MRI were unrevealing. Continue dysphagia 3 diet.  Acute hypoxic respiratory failure likely due to aspiration pneumonia: On admission 5 L, azithromycin was discontinued. Continue aspiration precaution. Currently dysphagia 3 diet. Still 5 L, out of bed to chair. PT evaluated the patient, will need skilled nursing facility placement.  Aortic regurgitation: Chronic follow-up with cardiology as an outpatient.  Large hiatal hernia: Continue Protonix, aspiration precaution and dysphagia 3 diet.  Multiple myeloma in remission: Oncology following.  Pancytopenia/leukopenia/normocytic anemia/thrombocytopenia: Platelets have remained stable will transfuse if less than 10,000. Hemoglobin has remained  relatively stable. Mild leukopenia of 2.4.  Hypothyroidism: Continue Synthroid.  Physical deconditioning: Will need skilled nursing facility   DVT prophylaxis: lovenox Family Communication:none Status is: Inpatient Remains inpatient appropriate because: Sepsis due to Pseudomonas    Code Status:     Code Status Orders  (From admission, onward)           Start     Ordered   05/20/23 2128  Do not attempt resuscitation (DNR)- Limited -Do Not Intubate (DNI)  Continuous       Question Answer Comment  If pulseless and not breathing No CPR or chest compressions.   In Pre-Arrest Conditions (Patient Is Breathing and Has A Pulse) Do not intubate. Provide all appropriate non-invasive medical interventions. Avoid ICU transfer unless indicated or required.   Consent: Discussion documented in EHR or advanced directives reviewed      05/20/23 2129           Code Status History     Date Active Date Inactive Code Status Order ID Comments User Context   05/20/2023 2045 05/20/2023 2130 Limited: Do not attempt resuscitation (DNR) -DNR-LIMITED -Do Not Intubate/DNI  [id number]  Carollee Herter, DO ED   05/05/2023 2318 05/11/2023 1834 DNR [id number]  Tomma Lightning, MD ED   05/05/2023 1715 05/05/2023 2318 DNR [id number]  Glyn Ade, MD ED   02/26/2016 2354 03/01/2016 1547 Full Code [id number]  Therisa Doyne, MD Inpatient   12/23/2015 0834 12/26/2015 1754 Full Code [id number]  Magrinat, Valentino Hue, MD Inpatient   12/22/2015 2232 12/23/2015 0833 DNR [id number]  Therisa Doyne, MD Inpatient   11/07/2015 1903 11/09/2015 1802 Full Code [id number]  Briscoe Deutscher, MD Inpatient      Advance Directive Documentation    Flowsheet Row Most Recent Value  Type of Advance Directive Out of facility DNR (pink MOST or  yellow form)  Pre-existing out of facility DNR order (yellow form or pink MOST form) --  "MOST" Form in Place? --         IV Access:   Peripheral IV   Procedures and diagnostic  studies:   MR BRAIN WO CONTRAST  Result Date: 05/21/2023 CLINICAL DATA:  Delirium EXAM: MRI HEAD WITHOUT CONTRAST TECHNIQUE: Multiplanar, multiecho pulse sequences of the brain and surrounding structures were obtained without intravenous contrast. COMPARISON:  MRI June 2024. FINDINGS: Brain: No acute infarction, hemorrhage, hydrocephalus, extra-axial collection or mass lesion. Moderate for age T2/FLAIR hyperintensities the white matter, nonspecific but compatible with chronic microvascular ischemic disease. Cerebral atrophy. Remote cerebellar infarct. Vascular: Major arterial flow voids are maintained at the skull base. Skull and upper cervical spine: Heterogeneous bone marrow. Sinuses/Orbits: Clear sinuses.  No acute orbital findings. Other: No mastoid effusions. IMPRESSION: 1. No acute abnormality. 2. Chronic microvascular ischemic disease and remote cerebellar infarct. 3. Heterogeneous bone marrow, compatible with reported history multiple myeloma. Electronically Signed   By: Feliberto Harts M.D.   On: 05/21/2023 15:59     Medical Consultants:   None.   Subjective:    Andre [name] he relates he feels better his breathing is not improved.  Objective:    Vitals:   05/22/23 1239 05/22/23 2106 05/23/23 0400 05/23/23 0531  BP: (!) 93/54 (!) 111/56  130/62  Pulse: 72 65  (!) 58  Resp: 20 20  17   Temp: 98 F (36.7 C) 98.1 F (36.7 C)  (!) 97.5 F (36.4 C)  TempSrc:  Oral  Oral  SpO2: 94% 94%  93%  Weight:   80.7 kg   Height:       SpO2: 93 % O2 Flow Rate (L/min): 5 L/min FiO2 (%): 100 %   Intake/Output Summary (Last 24 hours) at 05/23/2023 1011 Last data filed at 05/23/2023 0900 Gross per 24 hour  Intake 1879.1 ml  Output 2000 ml  Net -120.9 ml   Filed Weights   05/21/23 0600 05/22/23 0123 05/23/23 0400  Weight: 82.5 kg 77.7 kg 80.7 kg    Exam: General exam: In no acute distress. Respiratory system: Good air movement and clear to auscultation. Cardiovascular system:  S1 & S2 heard, RRR. No JVD. Gastrointestinal system: Abdomen is nondistended, soft and nontender.  Extremities: No pedal edema. Skin: No rashes, lesions or ulcers Psychiatry: Judgement and insight appear normal. Mood & affect appropriate.    Data Reviewed:    Labs: Basic Metabolic Panel: Recent Labs  Lab 05/20/23 1643 05/20/23 2320 05/22/23 0420 05/23/23 0413  NA 131* 131* 137 134*  K 3.9 3.7 4.0 3.7  CL 97* 98 108 101  CO2 22 23 23 25   GLUCOSE 107* 120* 93 97  BUN 15 14 15 21   CREATININE 0.96 1.00 0.92 0.91  CALCIUM 9.4 8.9 9.2 8.9  MG  --  1.9 2.3 2.1  PHOS  --   --  2.3* 2.4*   GFR Estimated Creatinine Clearance: 73.5 mL/min (by C-G formula based on SCr of 0.91 mg/dL). Liver Function Tests: Recent Labs  Lab 05/20/23 1643 05/20/23 2320 05/22/23 0420 05/23/23 0413  AST 45* 35 29  --   ALT 40 35 35  --   ALKPHOS 91 79 66  --   BILITOT 0.9 0.9 0.7  --   PROT 6.7 6.1* 5.7*  --   ALBUMIN 3.9 3.4* 3.0* 2.9*   Recent Labs  Lab 05/20/23 1643  LIPASE 26   Recent Labs  Lab 05/20/23 1825  AMMONIA 26   Coagulation profile No results for input(s): "INR", "PROTIME" in the last 168 hours. COVID-19 Labs  No results for input(s): "DDIMER", "FERRITIN", "LDH", "CRP" in the last 72 hours.  Lab Results  Component Value Date   SARSCOV2NAA NEGATIVE 05/20/2023    CBC: Recent Labs  Lab 05/20/23 1643 05/20/23 2320 05/22/23 0420 05/23/23 0413  WBC 8.9 9.2 4.3 2.4*  NEUTROABS 6.3 6.4  --   --   HGB 11.0* 9.5* 9.5* 8.9*  HCT 33.6* 29.5* 29.8* 28.0*  MCV 113.9* 113.0* 116.0* 115.7*  PLT 40* 33* 32* 35*   Cardiac Enzymes: Recent Labs  Lab 05/22/23 0420  CKTOTAL 28*   BNP (last 3 results) No results for input(s): "PROBNP" in the last 8760 hours. CBG: No results for input(s): "GLUCAP" in the last 168 hours. D-Dimer: No results for input(s): "DDIMER" in the last 72 hours. Hgb A1c: No results for input(s): "HGBA1C" in the last 72 hours. Lipid Profile: No  results for input(s): "CHOL", "HDL", "LDLCALC", "TRIG", "CHOLHDL", "LDLDIRECT" in the last 72 hours. Thyroid function studies: Recent Labs    05/20/23 2320  TSH 0.173*   Anemia work up: Recent Labs    05/20/23 2320  VITAMINB12 1,163*  FOLATE 27.7   Sepsis Labs: Recent Labs  Lab 05/20/23 1631 05/20/23 1643 05/20/23 1846 05/20/23 2320 05/22/23 0420 05/23/23 0413  PROCALCITON  --   --   --  0.61  --   --   WBC  --  8.9  --  9.2 4.3 2.4*  LATICACIDVEN 1.2  --  1.7  --   --   --    Microbiology Recent Results (from the past 240 hour(s))  Urine Culture (for pregnant, neutropenic or urologic patients or patients with an indwelling urinary catheter)     Status: Abnormal   Collection Time: 05/20/23  3:47 AM   Specimen: Urine, Clean Catch  Result Value Ref Range Status   Specimen Description   Final    URINE, CLEAN CATCH Performed at [facility], [address], [place], [place] [address]    Special Requests   Final    NONE Performed at [facility], [address] [place], [place] [address]    Culture >=100,000 COLONIES/mL PSEUDOMONAS AERUGINOSA (A)  Final   Report Status 05/23/2023 FINAL  Final   Organism ID, Bacteria PSEUDOMONAS AERUGINOSA (A)  Final      Susceptibility   Pseudomonas aeruginosa - MIC*    CEFTAZIDIME 4 SENSITIVE Sensitive     CIPROFLOXACIN <=0.25 SENSITIVE Sensitive     GENTAMICIN <=1 SENSITIVE Sensitive     IMIPENEM 2 SENSITIVE Sensitive     PIP/TAZO 8 SENSITIVE Sensitive     CEFEPIME 2 SENSITIVE Sensitive     * >=100,000 COLONIES/mL PSEUDOMONAS AERUGINOSA  Culture, blood (routine x 2)     Status: None (Preliminary result)   Collection Time: 05/20/23  4:44 PM   Specimen: BLOOD  Result Value Ref Range Status   Specimen Description   Final    BLOOD BLOOD LEFT FOREARM Performed at [facility], [address] [place], [place] [address]    Special Requests   Final    BOTTLES DRAWN AEROBIC AND ANAEROBIC  Blood Culture adequate volume Performed at [facility], [address], [place], [place] [address]    Culture   Final    NO GROWTH 3 DAYS Performed at [facility], [address],  [place], [place] [address]    Report Status PENDING  Incomplete  Culture, blood (routine x 2)     Status: None (Preliminary result)   Collection Time: 05/20/23  6:25 PM   Specimen: BLOOD  Result Value Ref Range Status   Specimen Description BLOOD SITE NOT SPECIFIED  Final   Special Requests   Final    BOTTLES DRAWN AEROBIC AND ANAEROBIC Blood Culture results may not be optimal due to an excessive volume of blood received in culture bottles   Culture   Final    NO GROWTH 3 DAYS Performed at [facility], [address], [place], [place] [address]    Report Status PENDING  Incomplete  Resp panel by RT-PCR (RSV, Flu A&B, Covid) Peripheral     Status: None   Collection Time: 05/20/23  6:25 PM   Specimen: Peripheral; Nasal Swab  Result Value Ref Range Status   SARS Coronavirus 2 by RT PCR NEGATIVE NEGATIVE Final    Comment: (NOTE) SARS-CoV-2 target nucleic acids are NOT DETECTED.  The SARS-CoV-2 RNA is generally detectable in upper respiratory specimens during the acute phase of infection. The lowest concentration of SARS-CoV-2 viral copies this assay can detect is 138 copies/mL. A negative result does not preclude SARS-Cov-2 infection and should not be used as the sole basis for treatment or other patient management decisions. A negative result may occur with  improper specimen collection/handling, submission of specimen other than nasopharyngeal swab, presence of viral mutation(s) within the areas targeted by this assay, and inadequate number of viral copies(<138 copies/mL). A negative result must be combined with clinical observations, patient history, and epidemiological information. The expected result is Negative.  Fact Sheet for Patients:   BloggerCourse.com  Fact Sheet for Healthcare Providers:  SeriousBroker.it  This test is no t yet approved or cleared by the [place] FDA and  has been authorized for detection and/or diagnosis of SARS-CoV-2 by FDA under an Emergency Use Authorization (EUA). This EUA will remain  in effect (meaning this test can be used) for the duration of the COVID-19 declaration under Section 564(b)(1) of the Act, 21 U.S.C.section 360bbb-3(b)(1), unless the authorization is terminated  or revoked sooner.       Influenza A by PCR NEGATIVE NEGATIVE Final   Influenza B by PCR NEGATIVE NEGATIVE Final    Comment: (NOTE) The Xpert Xpress SARS-CoV-2/FLU/RSV plus assay is intended as an aid in the diagnosis of influenza from Nasopharyngeal swab specimens and should not be used as a sole basis for treatment. Nasal washings and aspirates are unacceptable for Xpert Xpress SARS-CoV-2/FLU/RSV testing.  Fact Sheet for Patients: BloggerCourse.com  Fact Sheet for Healthcare Providers: SeriousBroker.it  This test is not yet approved or cleared by the [place] FDA and has been authorized for detection and/or diagnosis of SARS-CoV-2 by FDA under an Emergency Use Authorization (EUA). This EUA will remain in effect (meaning this test can be used) for the duration of the COVID-19 declaration under Section 564(b)(1) of the Act, 21 U.S.C. section 360bbb-3(b)(1), unless the authorization is terminated or revoked.     Resp Syncytial Virus by PCR NEGATIVE NEGATIVE Final    Comment: (NOTE) Fact Sheet for Patients: BloggerCourse.com  Fact Sheet for Healthcare Providers: SeriousBroker.it  This test is not yet approved or cleared by the [place] FDA and has been authorized for detection and/or diagnosis of SARS-CoV-2 by FDA under an Emergency Use  Authorization (EUA). This EUA will remain in effect (meaning this test can be used)  for the duration of the COVID-19 declaration under Section 564(b)(1) of the Act, 21 U.S.C. section 360bbb-3(b)(1), unless the authorization is terminated or revoked.  Performed at [facility], [address] [place], [place] [address]   Respiratory (~20 pathogens) panel by PCR     Status: None   Collection Time: 05/21/23  4:32 PM   Specimen: Nasopharyngeal Swab; Respiratory  Result Value Ref Range Status   Adenovirus NOT DETECTED NOT DETECTED Final   Coronavirus 229E NOT DETECTED NOT DETECTED Final    Comment: (NOTE) The Coronavirus on the Respiratory Panel, DOES NOT test for the novel  Coronavirus (2019 nCoV)    Coronavirus HKU1 NOT DETECTED NOT DETECTED Final   Coronavirus NL63 NOT DETECTED NOT DETECTED Final   Coronavirus OC43 NOT DETECTED NOT DETECTED Final   Metapneumovirus NOT DETECTED NOT DETECTED Final   Rhinovirus / Enterovirus NOT DETECTED NOT DETECTED Final   Influenza A NOT DETECTED NOT DETECTED Final   Influenza B NOT DETECTED NOT DETECTED Final   Parainfluenza Virus 1 NOT DETECTED NOT DETECTED Final   Parainfluenza Virus 2 NOT DETECTED NOT DETECTED Final   Parainfluenza Virus 3 NOT DETECTED NOT DETECTED Final   Parainfluenza Virus 4 NOT DETECTED NOT DETECTED Final   Respiratory Syncytial Virus NOT DETECTED NOT DETECTED Final   Bordetella pertussis NOT DETECTED NOT DETECTED Final   Bordetella Parapertussis NOT DETECTED NOT DETECTED Final   Chlamydophila pneumoniae NOT DETECTED NOT DETECTED Final   Mycoplasma pneumoniae NOT DETECTED NOT DETECTED Final    Comment: Performed at [facility], [address] [place], [place] [address]     Medications:    calcium-vitamin D  1 tablet Oral Q breakfast   dapsone  100 mg Oral Daily   escitalopram  10 mg Oral Daily   ferrous sulfate  325 mg Oral BID WC   levothyroxine  125 mcg Oral Daily   methylphenidate   2.5 mg Oral Daily   pantoprazole  40 mg Oral Daily   valACYclovir  500 mg Oral Daily   Continuous Infusions:  ceFEPime (MAXIPIME) IV Stopped (05/23/23 0537)   metronidazole Stopped (05/23/23 0230)      LOS: 3 days   Marinda Elk  [facility]  05/23/2023, 10:11 AM

## 2023-05-23 NOTE — Progress Notes (Signed)
Patient found on the floor as soon as nursing staffs in the room. Bed alarm went off when patient got out of bed. Per staffs, patient was found on his right side on the floor. Assisted to chair, patient remains alert and verbally responsive. Vitals WNL. Alert and oriented, forgetful as baseline. ROM all extremities WNL. No complaints of pain. Noted bruise on his right side of forehead and slightly swollen. Applied ice pack to right forehead. Assisted back to bed and made comfortable. Liana Crocker NP was notified, order received.

## 2023-05-23 NOTE — Progress Notes (Signed)
Left message to patient's contact person (spouse), to call [employer]. Awaiting.

## 2023-05-23 NOTE — [place] FL2 (Signed)
[place] MEDICAID FL2 LEVEL OF CARE FORM     IDENTIFICATION  Patient Name: Andre [name] Birthdate: 1951/09/10 Sex: male Admission Date (Current Location): 05/20/2023  [facility], [place] and [place] Number:  Producer, television/film/video and Address:  [facility],  [address] [place], [place] [address]      Provider Number: [id number]  Attending Physician Name and Address:  Marinda Elk, MD  Relative Name and Phone Number:  Crumrine,[name] (Spouse)  [phone] (Home Phone    Current Level of Care: Hospital Recommended Level of Care: Skilled Nursing Facility Prior Approval Number:    Date Approved/Denied:   PASRR Number:    Discharge Plan: SNF    Current Diagnoses: Patient Active Problem List   Diagnosis Date Noted   DNR (do not resuscitate)/DNI(Do Not Intubate) 05/20/2023   Severe sepsis (HCC) 05/20/2023   Acute cystitis without hematuria 05/20/2023   Acute metabolic encephalopathy 05/20/2023   Moderate-to-severe aortic regurgitation 05/20/2023   Macrocytic anemia 05/20/2023   Acute hypoxemic respiratory failure (HCC) 05/05/2023   Reduced mobility 10/13/2022   Self-care deficit 10/13/2022   Physical deconditioning 10/02/2022   History of engineered cell therapy infusion 09/26/2022   Meteorism 02/09/2022   Memory problem 08/30/2021   Hemorrhoids 11/20/2019   Neuropathy due to chemotherapeutic drug (HCC) 09/01/2019   Dry skin dermatitis 08/16/2016   Peripheral edema 04/26/2016   Bone marrow transplant status (HCC) 03/14/2016   Debility 02/26/2016   Back pain 02/26/2016   Patient in clinical research study 01/10/2016   GERD (gastroesophageal reflux disease)    Weakness 12/22/2015   General weakness    Multiple myeloma not having achieved remission (HCC)    Chronic anemia    Constipation    Thrombocytopenia (HCC) 11/05/2015   Actinic keratosis 07/08/2015   Raynaud's phenomenon 09/17/2013   Cough 01/10/2013   Vitamin D deficiency 09/30/2012    Hypothyroidism 09/30/2012   Hypogonadism male 09/30/2012   Fatigue 09/27/2012   Insomnia 09/27/2012   Reactive depression (situational) 09/27/2012   Pancytopenia (HCC) 03/27/2012   HYPERLIPIDEMIA 05/30/2010   Anemia of chronic disease 05/30/2010   Asthma 05/30/2010   SCHATZKI'S RING 05/30/2010   Peptic ulcer disease 05/30/2010   HIATAL HERNIA 05/30/2010   SLEEP APNEA, MILD 05/30/2010   TRANSAMINASES, SERUM, ELEVATED 05/30/2010    Orientation RESPIRATION BLADDER Height & Weight     Self, Situation, Place  O2 (5L) Incontinent, External catheter Weight: 178 lb (80.7 kg) Height:  5\' 7"  (170.2 cm)  BEHAVIORAL SYMPTOMS/MOOD NEUROLOGICAL BOWEL NUTRITION STATUS      Continent Diet (DSY 3)  AMBULATORY STATUS COMMUNICATION OF NEEDS Skin   Limited Assist Verbally Normal                       Personal Care Assistance Level of Assistance  Bathing, Feeding, Dressing Bathing Assistance: Limited assistance Feeding assistance: Independent Dressing Assistance: Limited assistance     Functional Limitations Info    Sight Info: Adequate Hearing Info: Adequate Speech Info: Adequate    SPECIAL CARE FACTORS FREQUENCY  OT (By licensed OT), PT (By licensed PT)     PT Frequency: 5 x a week OT Frequency: 5x a week            Contractures Contractures Info: Not present    Additional Factors Info  Code Status, Allergies Code Status Info: DNR Allergies Info: Atorvastatin  Rosuvastatin  Crestor (Rosuvastatin Calcium)  Lipitor (Atorvastatin Calcium)  Septra (Sulfamethoxazole-trimethoprim) Psychotropic Info: escitalopram (LEXAPRO) tablet 10 mg daily  Current Medications (05/23/2023):  This is the current hospital active medication list Current Facility-Administered Medications  Medication Dose Route Frequency Provider Last Rate Last Admin   calcium-vitamin D (OSCAL WITH D) 500-5 MG-MCG per tablet 1 tablet  1 tablet Oral Q breakfast Candelaria Stagers T, MD   1 tablet at 05/23/23  0835   ceFEPIme (MAXIPIME) 2 g in sodium chloride 0.9 % 100 mL IVPB  2 g Intravenous Q8H Wofford, Deirdre Evener, RPH   Stopped at 05/23/23 0537   dapsone tablet 100 mg  100 mg Oral Daily Candelaria Stagers T, MD   100 mg at 05/23/23 0835   escitalopram (LEXAPRO) tablet 10 mg  10 mg Oral Daily Candelaria Stagers T, MD   10 mg at 05/23/23 0835   ferrous sulfate tablet 325 mg  325 mg Oral BID WC Ladene Artist, MD   325 mg at 05/23/23 0835   levothyroxine (SYNTHROID) tablet 125 mcg  125 mcg Oral Daily Candelaria Stagers T, MD   125 mcg at 05/23/23 0507   methylphenidate (RITALIN) tablet 2.5 mg  2.5 mg Oral Daily Candelaria Stagers T, MD   2.5 mg at 05/23/23 0835   ondansetron (ZOFRAN) injection 4 mg  4 mg Intravenous Q6H PRN Carollee Herter, DO   4 mg at 05/21/23 1357   pantoprazole (PROTONIX) EC tablet 40 mg  40 mg Oral Daily Candelaria Stagers T, MD   40 mg at 05/23/23 0835   valACYclovir (VALTREX) tablet 500 mg  500 mg Oral Daily Candelaria Stagers T, MD   500 mg at 05/23/23 7169     Discharge Medications: Please see discharge summary for a list of discharge medications.  Relevant Imaging Results:  Relevant Lab Results:   Additional Information [id number]  Valentina Shaggy Sesar Madewell, LCSW

## 2023-05-23 NOTE — Progress Notes (Signed)
IP PROGRESS NOTE  Subjective:   Andre [name] denies dyspnea. . Objective: Vital signs in last 24 hours: Blood pressure (!) 102/59, pulse 72, temperature 97.6 F (36.4 C), temperature source Oral, resp. rate 20, height 5\' 7"  (1.702 m), weight 178 lb (80.7 kg), SpO2 94%.  Intake/Output from previous day: 09/03 0701 - 09/04 0700 In: 1329.1 [P.O.:880; IV Piggyback:449.1] Out: 1550 [Urine:1550]  Physical Exam:  HEENT: No thrush, no bleeding  Abdomen: No hepatosplenomegaly Extremities: No leg edema Neurologic: Alert, oriented to place and diagnosis.   Lab Results: Recent Labs    05/22/23 0420 05/23/23 0413  WBC 4.3 2.4*  HGB 9.5* 8.9*  HCT 29.8* 28.0*  PLT 32* 35*    BMET Recent Labs    05/22/23 0420 05/23/23 0413  NA 137 134*  K 4.0 3.7  CL 108 101  CO2 23 25  GLUCOSE 93 97  BUN 15 21  CREATININE 0.92 0.91  CALCIUM 9.2 8.9    No results found for: "CEA1", "CEA", "XBJ478", "CA125"  Studies/Results: Andre BRAIN WO CONTRAST  Result Date: 05/21/2023 CLINICAL DATA:  Delirium EXAM: MRI HEAD WITHOUT CONTRAST TECHNIQUE: Multiplanar, multiecho pulse sequences of the brain and surrounding structures were obtained without intravenous contrast. COMPARISON:  MRI June 2024. FINDINGS: Brain: No acute infarction, hemorrhage, hydrocephalus, extra-axial collection or mass lesion. Moderate for age T2/FLAIR hyperintensities the white matter, nonspecific but compatible with chronic microvascular ischemic disease. Cerebral atrophy. Remote cerebellar infarct. Vascular: Major arterial flow voids are maintained at the skull base. Skull and upper cervical spine: Heterogeneous bone marrow. Sinuses/Orbits: Clear sinuses.  No acute orbital findings. Other: No mastoid effusions. IMPRESSION: 1. No acute abnormality. 2. Chronic microvascular ischemic disease and remote cerebellar infarct. 3. Heterogeneous bone marrow, compatible with reported history multiple myeloma. Electronically Signed   By:  Feliberto Harts M.D.   On: 05/21/2023 15:59    Medications: I have reviewed the patient's current medications.  Assessment/Plan: Multiple myeloma- IgG lambda  bortezomib (subcutaneously), lenalidomide, and dexamethasone, with repeat bone marrow biopsy May of 2012 showing 10% plasmacytosis   (2) high-dose chemotherapy with BCNU and melphalan at [facility], followed by stem cell rescue July of 2012   (3) on zoledronic acid started December of 2012, initially monthly, currently given every 3 months, most recent dose  12/07/2015   (4) low-dose lenalidomide resumed April 2013, interrupted several times.  Resumed again on 02/19/2013, ata dose of 5 mg daily, 21 days on and 7 days off, later further reduced to 7 days on, 7 days off   (5) CNS symptoms and abnormal brain MRI extensively evaluated by neurology with no definitive diagnosis established   (6) rising M spike noted June 2014 but did not meet criteria for carfilzomib study   (7) on lenalidomide 25 mg daily, 14 days on, 7 days off, starting 04/18/2013, interrupted December 2014 because of rash;             (a) resumed January 2015 at 10 mg/ day at 21 days on/ 7 days off             (b) starting 08/18/2014 decreased to 10 mg/ day 14 days on, 7 days off because of cytopenias             (c) as of February 2016 was on 5 mg daily 7 days on 7 days off             (d) lenalidomide discontinued December 2016 with evidence of disease progression   (8) transient global  amnesia 05/29/2015, resolved without intervention   (9) starting PVD 10/25/2015 w ASA 325 thromboprophylaxis, valacyclovir prophylaxis, last dose 12/17/2015             (a) pomalidomide 4 mg/d days 1-14             (b) bortezomib sQ days 2,5,9,12 of each 21 day cycle             (c) dexamethasome 20 mg two days a week             (d) dexamethasone bortezomib and pomalidomide discontinued late December 2018 with poor tolerance   (10) metapneumovirus pneumonia April 2017             (a)  completing course of steroids and week of bactrim mid April 2017   (11) status post second autologous transplant at [facility] 02/04/2016(preparatory regimen melphalan 200 mg/m)             (a) received twelve-month vaccinations 03/14/2017 (DPT, Haemophilus, Pneumovax 13, polio)             (b) 14 months injections 05/04/2016 include DTaP, Hib conjugate, HepB energex B 20 mcg/ml, Prevnar 13             (c) 24 month vaccines due at [facility] June 2019   (12) maintenance therapy started November 2017, consisting of             (a) bortezomib 1.3 mg/M2 every 14 days, first dose 07/27/2016             (b) pomalidomide 1 mg days 1-21 Q28 days, started 07/19/2016             (c) zolendronate monthly started 07/27/2016 (previously Q 3 months) however patient unable to tolerate, and changed back to q3 months in April, 2018             (d) Bortezomib changed to monthly as of June 2018 because of tolerance issues, however discontinued after 09/14/2017 dose because of a rise in his M spike             (e) pomalidomide held after 10/18/2017 in preparation for possible study at [facility] (venetoclax)--never resumed             (f) with numbers actually improved off treatment, resumed every 2-week bortezomib 12/25/2017             (g) changed to every 3-week bortezomib as of 03/17/2019             (h) briefly on weekly treatments times 22 October 2020 due to increase in M spike             (I) maintenance therapy discontinued with evidence of progression   (13) bortezomib/daratumumab/dexamethasone started 12/20/2020.             (a) Decadron dose dropped to 10 mg day of and day following treatment starting May 2022             (b) day 8 bortezomib omitted beginning with the June cycle per patient preference             (C) treatment changed to monthly daratumumab/Velcade/Decadron beginning 06/27/2021-patient decision (14) weekly Velcade/Cytoxan beginning 11/14/2021 (15) changed to Cytoxan/carfilzomib/Decadron  12/19/2021-treatment placed on hold 05/15/2022, carfilzomib 05/24/2022 and 05/31/2022 16.  Leukocyte a pheresis procedure at [facility] 06/15/2022 17.  Treatment resumed with Cytoxan/carfilzomib/Decadron 06/28/2022, last given 09/06/2022 18.  CAR-T 09/26/2022 with Carvykti 19.  CRS and ICANS following CAR-T therapy,  status post a Decadron taper completed 10/08/2022 20.  Presentation to the emergency room 10/30/2022 with failure to thrive and a fall  21.  Pancytopenia secondary to multiple myeloma and CAR-T therapy-improved 22.  Bone marrow biopsy 11/13/2022-mildly hypocellular bone marrow with relative erythroid hyperplasia and decreased megakaryocytes; no plasma cells identified by differential count or CD138 immunohistochemical stain; flow cytometry negative for a clonal plasma cell population.  The bone marrow biopsy 05/10/2023-mildly hypercellular marrow with mild decrease in megakaryocytes, plasma cells not increased, absent iron stores 23.  Cognitive impairment predating CAR-T therapy and worsened following CAR-T therapy 24.  Admission 05/05/2023 with Pseudomonas sepsis CTs chest, abdomen, and pelvis-right lung base atelectasis, no infiltrate 25.  Admission 05/20/2023 with hypoxia, altered mental status, and fever Infectious disease evaluation on hospital admission consistent with a urinary tract infection, urine culture positive for Pseudomonas aeruginosa 26.  Hypogammaglobulinemia secondary to multiple myeloma and CAR-T therapy   Andre [name] has multiple myeloma.  He underwent CAR-T therapy in January.  He is in remission from myeloma, but has persistent pancytopenia.  He has hypogammaglobulinemia and receives monthly IVIG therapy at the cancer center.  He was admitted 05/05/2023 Pseudomonas bacteremia/sepsis.  No clear source for infection was identified.  He completed an outpatient course of ciprofloxacin.   He has persistent pancytopenia following CAR-T therapy.  He had a bone marrow biopsy  05/10/2023.  No evidence of myeloma.  Her stores were diminished.  Cytogenetics and a myeloma FISH panel are pending.  Andre [name] was readmitted on 05/20/2023 with altered mental status, hypoxia, and fever.  Urinalysis was consistent with an infection and the culture returned positive for Pseudomonas.  He is afebrile on antibiotics.  The immunoglobulin levels remain low.  I will order IVIG therapy.  Andre [name] should be able to return to the skilled nursing unit at [facility].  Recommendations: Continue antibiotics for the Pseudomonas urinary tract infection IVIG Wean oxygen, management of respiratory failure per the medical service Transfuse platelets for bleeding or a count of less than 10,000 Outpatient follow-up will be scheduled at the Cancer center   LOS: 3 days   Thornton Papas, MD   05/23/2023, 1:41 PM

## 2023-05-23 NOTE — Progress Notes (Signed)
Mobility Specialist - Progress Note   05/23/23 1006  Mobility  Activity Ambulated with assistance in hallway  Level of Assistance Contact guard assist, steadying assist  Assistive Device Front wheel walker  Distance Ambulated (ft) 350 ft  Range of Motion/Exercises Active  Activity Response Tolerated well  Mobility Referral Yes  $Mobility charge 1 Mobility  Mobility Specialist Start Time (ACUTE ONLY) [id number]  Mobility Specialist Stop Time (ACUTE ONLY) 1000  Mobility Specialist Time Calculation (min) (ACUTE ONLY) 11 min   Pt received in chair and agreed to mobility. Contact guard for session. Pt had no issues throughout session, claimed to feel better than yesterday.  Returned to chair with all needs met. Family in room.  Marilynne Halsted Mobility Specialist

## 2023-05-23 NOTE — Progress Notes (Signed)
Patient's spouse called back and was made aware of the fall.

## 2023-05-23 NOTE — Progress Notes (Signed)
Initial Nutrition Assessment  DOCUMENTATION CODES:   Not applicable  INTERVENTION:  - DYS 3 diet per SLP.  - Ensure Plus High Protein po once daily, provides 350 kcal and 20 grams of protein. - Monitor weight trends.   Nutrition status seems stable at this time, will sign off. Please re-consult as needed.   NUTRITION DIAGNOSIS:   Increased nutrient needs related to acute illness as evidenced by estimated needs.  GOAL:   Patient will meet greater than or equal to 90% of their needs  MONITOR:   PO intake, Supplement acceptance, Weight trends  REASON FOR ASSESSMENT:   Consult Assessment of nutrition requirement/status  ASSESSMENT:   72 y.o. male PMH of multiple myeloma in remission after stem cell transplant and recent CAR-T therapy in January 2024, hypogammaglobulinemia on monthly IVIG, pancytopenia, cognitive impairment who presented  with sepsis.  Patient reports a UBW of 170# and that he may have lost weight over the past 6 weeks. Per EMR, patient appears to have lost weight from December to February but since that time he has gained weight back and is stable.   Patient endorses eating well at home with 3 meals a day and good appetite. Drinks Ensure ~4x/week.   Had SLP eval 9/2 and recommended a DYS 3 diet. Current appetite is also good and patient reports eating well. Documented to be consuming 100% of meals. He is agreeable to receive 1 Ensure daily.  Of note, patient recently seen by another inpatient RD and was reportedly a pescatarian. However, patient did not mention this to RD. Will keep patient on current diet for now as he is eating well.   Medications reviewed and include: Calcium+Vitamin D, 325mg  ferrous sulfate  Labs reviewed:  Na 134 Phosphorus 2.4   NUTRITION - FOCUSED PHYSICAL EXAM:  Flowsheet Row Most Recent Value  Orbital Region Mild depletion  Upper Arm Region No depletion  Thoracic and Lumbar Region No depletion  Buccal Region Mild  depletion  Temple Region Mild depletion  Clavicle Bone Region No depletion  Clavicle and Acromion Bone Region No depletion  Scapular Bone Region Unable to assess  Dorsal Hand No depletion  Patellar Region No depletion  Anterior Thigh Region No depletion  Posterior Calf Region No depletion  Edema (RD Assessment) Mild  Hair Reviewed  Eyes Reviewed  Mouth Reviewed  Skin Reviewed  Nails Reviewed       Diet Order:   Diet Order             DIET DYS 3 Room service appropriate? Yes; Fluid consistency: Thin  Diet effective now                   EDUCATION NEEDS:  Education needs have been addressed  Skin:  Skin Assessment: Reviewed RN Assessment  Last BM:  9/2  Height:  Ht Readings from Last 1 Encounters:  05/21/23 5\' 7"  (1.702 m)   Weight:  Wt Readings from Last 1 Encounters:  05/23/23 80.7 kg    BMI:  Body mass index is 27.88 kg/m.  Estimated Nutritional Needs:  Kcal:  1950-2150 kcals Protein:  85-100 grams Fluid:  >/= 2L    Shelle Iron RD, LDN For contact information, refer to [facility].

## 2023-05-24 DIAGNOSIS — A419 Sepsis, unspecified organism: Secondary | ICD-10-CM | POA: Diagnosis not present

## 2023-05-24 DIAGNOSIS — R6520 Severe sepsis without septic shock: Secondary | ICD-10-CM | POA: Diagnosis not present

## 2023-05-24 NOTE — Progress Notes (Signed)
    Patient Name: Andre [name]           DOB: [birth year]  MRN: [id number]      Admission Date: 05/20/2023  Attending Provider: Marinda Elk, MD  Primary Diagnosis: Severe sepsis [facility])   Level of care: Progressive    CROSS COVER NOTE   Date of Service   05/24/2023   Andre [name], 72 y.o. male, was admitted on 05/20/2023 for Severe sepsis [facility]).    HPI/Events of Note   Unwitnessed fall   Fall occurred while patient was attempting to get out of bed without assistance.  Bed alarmed that fall had occurred. Patient was found alert and laying on his right side.  Some swelling and bruising noted on his right forehead. Asides from this, there are no other visible sign of injury or deformities.  Denies pain. No neuro changes reported.  Vital signs WNL.  Mentation like prior with some confusion and forgetfulness.  ROM at baseline, patient was able to get up with assistance.   Interventions/ Plan   CT head-negative for acute process Nursing staff to continue monitoring for change in acute symptoms, mobility, and pain.        Anthoney Harada, DNP, ACNPC- AG [facility]

## 2023-05-24 NOTE — Progress Notes (Addendum)
[facility] PROGRESS NOTE    Progress Note  [name]  QVZ:[id number] DOB: 11/05/1950 DOA: 05/20/2023 PCP: [name], MD     Brief Narrative:   Andre [name] is an 72 y.o. male past medical history of multiple myeloma in remission after stem cell transplant and recent CAR-T therapy in January 2024, hypogammaglobulinemia on monthly IVIG, pancytopenia, cognitive impairment recently discharged from the hospital on 05/11/2023 for Pseudomonas bacteremia for which he was discharged on Cipro comes in for altered mental status hypoxia and fever, likely due to sepsis satting 83% on room air.  CT angio of the chest was negative for PE or pneumonia.  COVID-19 influenza and SARS-CoV-2 PCR were negative.  Started on broad-spectrum antibiotics and oncology was consulted.  Grew Pseudomonas currently on cefepime and Zithromax  Assessment/Plan:   Severe sepsis (HCC) possibly due to recurrent Pseudomonas UTI: Urine cultures grew Pseudomonas pansensitive, blood cultures have been negative till date. Continue IV cefepime, discontinue Flagyl. T max of 100.8. Blood cultures remain negative to date.  Acute metabolic encephalopathy: Likely due to infectious etiology. CT of the head, B12 VBG and MRI were unrevealing. Continue dysphagia 3 diet.  Acute hypoxic respiratory failure likely due to aspiration pneumonia: On admission 5 L, azithromycin was discontinued. Continue aspiration precaution, currently on IV cefepime. Now 2 to 3 L of oxygen. Out of bed to chair try to wean to room air. PT evaluated the patient, will need skilled nursing facility placement. Will ask PT to reevaluate.  Unwitnessed fall: While patient attempting to get out of bed without assistance. This happened overnight neurological physical exam at that time was negative. CT of the head showed no acute findings.  Aortic regurgitation: Chronic, follow-up with cardiology as an outpatient.  Large hiatal  hernia: Continue Protonix, aspiration precaution and dysphagia 3 diet.  Multiple myeloma in remission: Oncology following.  Pancytopenia/leukopenia/normocytic anemia/thrombocytopenia: Platelets have remained stable will transfuse if less than 10,000. Hemoglobin has remained relatively stable. Mild leukopenia of 2.4.  Hypothyroidism: Continue Synthroid.  Physical deconditioning: Will need skilled nursing facility   DVT prophylaxis: lovenox Family Communication:none Status is: Inpatient Remains inpatient appropriate because: Sepsis due to Pseudomonas    Code Status:     Code Status Orders  (From admission, onward)           Start     Ordered   05/20/23 2128  Do not attempt resuscitation (DNR)- Limited -Do Not Intubate (DNI)  Continuous       Question Answer Comment  If pulseless and not breathing No CPR or chest compressions.   In Pre-Arrest Conditions (Patient Is Breathing and Has A Pulse) Do not intubate. Provide all appropriate non-invasive medical interventions. Avoid ICU transfer unless indicated or required.   Consent: Discussion documented in EHR or advanced directives reviewed      05/20/23 2129           Code Status History     Date Active Date Inactive Code Status Order ID Comments User Context   05/20/2023 2045 05/20/2023 2130 Limited: Do not attempt resuscitation (DNR) -DNR-LIMITED -Do Not Intubate/DNI  [id number]  Carollee Herter, DO ED   05/05/2023 2318 05/11/2023 1834 DNR [id number]  Tomma Lightning, MD ED   05/05/2023 1715 05/05/2023 2318 DNR [id number]  Glyn Ade, MD ED   02/26/2016 2354 03/01/2016 1547 Full Code [id number]  Therisa Doyne, MD Inpatient   12/23/2015 0834 12/26/2015 1754 Full Code [id number]  Magrinat, Valentino Hue, MD Inpatient   12/22/2015 2232 12/23/2015 1517  DNR [id number]  Therisa Doyne, MD Inpatient   11/07/2015 1903 11/09/2015 1802 Full Code [id number]  Briscoe Deutscher, MD Inpatient      Advance Directive Documentation     Flowsheet Row Most Recent Value  Type of Advance Directive Out of facility DNR (pink MOST or yellow form)  Pre-existing out of facility DNR order (yellow form or pink MOST form) --  "MOST" Form in Place? --         IV Access:   Peripheral IV   Procedures and diagnostic studies:   CT HEAD WO CONTRAST ( )  Result Date: 05/24/2023 CLINICAL DATA:  Initial evaluation evaluate initial evaluation for trauma, fall. EXAM: CT HEAD WITHOUT CONTRAST TECHNIQUE: Contiguous axial images were obtained from the base of the skull through the vertex without intravenous contrast. RADIATION DOSE REDUCTION: This exam was performed according to the departmental dose-optimization program which includes automated exposure control, adjustment of the mA and/or kV according to patient size and/or use of iterative reconstruction technique. COMPARISON:  Prior study from 05/21/2023. FINDINGS: Brain: Cerebral volume within normal limits. Moderately advanced chronic microvascular ischemic disease with small remote left cerebellar infarct. No acute intracranial hemorrhage. No acute large vessel territory infarct. No mass lesion or midline shift. No hydrocephalus or extra-axial fluid collection. Vascular: No abnormal hyperdense vessel. Scattered calcified atherosclerosis present at the skull base. Skull: Scalp soft tissues demonstrate no acute finding. Calvarium intact. Innumerable lucency seen throughout the calvarium, consistent with history of multiple myeloma. Sinuses/Orbits: Globes and orbital soft tissues demonstrate no acute finding. Mild mucosal thickening about the ethmoidal air cells. Paranasal sinuses are otherwise clear. No mastoid effusion. Other: None. IMPRESSION: 1. No acute intracranial abnormality. 2. Moderately advanced chronic microvascular ischemic disease with small remote left cerebellar infarct. 3. Innumerable lucency throughout the calvarium, consistent with history of multiple myeloma. Electronically  Signed   By: Rise Mu M.D.   On: 05/24/2023 01:56     Medical Consultants:   None.   Subjective:    Andre [name] feels great no complaints wanting to go back to [facility].  Objective:    Vitals:   05/23/23 2222 05/23/23 2247 05/24/23 0151 05/24/23 0514  BP: 120/63  134/62 (!) 106/94  Pulse: 90 70 60 (!) 59  Resp: 18  14 18   Temp: 98.9 F (37.2 C)  98.3 F (36.8 C) 97.6 F (36.4 C)  TempSrc: Oral  Oral Oral  SpO2:  92% 92% 93%  Weight:    82.8 kg  Height:       SpO2: 93 % O2 Flow Rate (L/min): 3 L/min FiO2 (%): 100 %   Intake/Output Summary (Last 24 hours) at 05/24/2023 0935 Last data filed at 05/24/2023 0841 Gross per 24 hour  Intake 1560 ml  Output 2950 ml  Net -1390 ml   Filed Weights   05/22/23 0123 05/23/23 0400 05/24/23 0514  Weight: 77.7 kg 80.7 kg 82.8 kg    Exam: General exam: In no acute distress. Respiratory system: Good air movement and clear to auscultation. Cardiovascular system: S1 & S2 heard, RRR. No JVD. Gastrointestinal system: Abdomen is nondistended, soft and nontender.  Extremities: No pedal edema. Skin: No rashes, lesions or ulcers Psychiatry: Judgement and insight appear normal. Mood & affect appropriate. Data Reviewed:    Labs: Basic Metabolic Panel: Recent Labs  Lab 05/20/23 1643 05/20/23 2320 05/22/23 0420 05/23/23 0413  NA 131* 131* 137 134*  K 3.9 3.7 4.0 3.7  CL 97* 98 108 101  CO2 22 23 23  25  GLUCOSE 107* 120* 93 97  BUN 15 14 15 21   CREATININE 0.96 1.00 0.92 0.91  CALCIUM 9.4 8.9 9.2 8.9  MG  --  1.9 2.3 2.1  PHOS  --   --  2.3* 2.4*   GFR Estimated Creatinine Clearance: 74.4 mL/min (by C-G formula based on SCr of 0.91 mg/dL). Liver Function Tests: Recent Labs  Lab 05/20/23 1643 05/20/23 2320 05/22/23 0420 05/23/23 0413  AST 45* 35 29  --   ALT 40 35 35  --   ALKPHOS 91 79 66  --   BILITOT 0.9 0.9 0.7  --   PROT 6.7 6.1* 5.7*  --   ALBUMIN 3.9 3.4* 3.0* 2.9*   Recent Labs  Lab  05/20/23 1643  LIPASE 26   Recent Labs  Lab 05/20/23 1825  AMMONIA 26   Coagulation profile No results for input(s): "INR", "PROTIME" in the last 168 hours. COVID-19 Labs  No results for input(s): "DDIMER", "FERRITIN", "LDH", "CRP" in the last 72 hours.  Lab Results  Component Value Date   SARSCOV2NAA NEGATIVE 05/20/2023    CBC: Recent Labs  Lab 05/20/23 1643 05/20/23 2320 05/22/23 0420 05/23/23 0413  WBC 8.9 9.2 4.3 2.4*  NEUTROABS 6.3 6.4  --   --   HGB 11.0* 9.5* 9.5* 8.9*  HCT 33.6* 29.5* 29.8* 28.0*  MCV 113.9* 113.0* 116.0* 115.7*  PLT 40* 33* 32* 35*   Cardiac Enzymes: Recent Labs  Lab 05/22/23 0420  CKTOTAL 28*   BNP (last 3 results) No results for input(s): "PROBNP" in the last 8760 hours. CBG: No results for input(s): "GLUCAP" in the last 168 hours. D-Dimer: No results for input(s): "DDIMER" in the last 72 hours. Hgb A1c: No results for input(s): "HGBA1C" in the last 72 hours. Lipid Profile: No results for input(s): "CHOL", "HDL", "LDLCALC", "TRIG", "CHOLHDL", "LDLDIRECT" in the last 72 hours. Thyroid function studies: No results for input(s): "TSH", "T4TOTAL", "T3FREE", "THYROIDAB" in the last 72 hours.  Invalid input(s): "FREET3"  Anemia work up: No results for input(s): "VITAMINB12", "FOLATE", "FERRITIN", "TIBC", "IRON", "RETICCTPCT" in the last 72 hours.  Sepsis Labs: Recent Labs  Lab 05/20/23 1631 05/20/23 1643 05/20/23 1846 05/20/23 2320 05/22/23 0420 05/23/23 0413  PROCALCITON  --   --   --  0.61  --   --   WBC  --  8.9  --  9.2 4.3 2.4*  LATICACIDVEN 1.2  --  1.7  --   --   --    Microbiology Recent Results (from the past 240 hour(s))  Urine Culture (for pregnant, neutropenic or urologic patients or patients with an indwelling urinary catheter)     Status: Abnormal   Collection Time: 05/20/23  3:47 AM   Specimen: Urine, Clean Catch  Result Value Ref Range Status   Specimen Description   Final    URINE, CLEAN  CATCH Performed at [facility], [address], [place], [place] [address]    Special Requests   Final    NONE Performed at [facility], [address] [place], [place] [address]    Culture >=100,000 COLONIES/mL PSEUDOMONAS AERUGINOSA (A)  Final   Report Status 05/23/2023 FINAL  Final   Organism ID, Bacteria PSEUDOMONAS AERUGINOSA (A)  Final      Susceptibility   Pseudomonas aeruginosa - MIC*    CEFTAZIDIME 4 SENSITIVE Sensitive     CIPROFLOXACIN <=0.25 SENSITIVE Sensitive     GENTAMICIN <=1 SENSITIVE Sensitive     IMIPENEM  2 SENSITIVE Sensitive     PIP/TAZO 8 SENSITIVE Sensitive     CEFEPIME 2 SENSITIVE Sensitive     * >=100,000 COLONIES/mL PSEUDOMONAS AERUGINOSA  Culture, blood (routine x 2)     Status: None (Preliminary result)   Collection Time: 05/20/23  4:44 PM   Specimen: BLOOD  Result Value Ref Range Status   Specimen Description   Final    BLOOD BLOOD LEFT FOREARM Performed at [facility], [address] [place], [place] [address]    Special Requests   Final    BOTTLES DRAWN AEROBIC AND ANAEROBIC Blood Culture adequate volume Performed at [facility], [address] [place], [place] [address]    Culture   Final    NO GROWTH 4 DAYS Performed at [facility], [address], [place], [place] [address]    Report Status PENDING  Incomplete  Culture, blood (routine x 2)     Status: None (Preliminary result)   Collection Time: 05/20/23  6:25 PM   Specimen: BLOOD  Result Value Ref Range Status   Specimen Description BLOOD SITE NOT SPECIFIED  Final   Special Requests   Final    BOTTLES DRAWN AEROBIC AND ANAEROBIC Blood Culture results may not be optimal due to an excessive volume of blood received in culture bottles   Culture   Final    NO GROWTH 4 DAYS Performed at [facility], [address], [place], [place] [address]    Report Status PENDING  Incomplete  Resp panel by RT-PCR (RSV,  Flu A&B, Covid) Peripheral     Status: None   Collection Time: 05/20/23  6:25 PM   Specimen: Peripheral; Nasal Swab  Result Value Ref Range Status   SARS Coronavirus 2 by RT PCR NEGATIVE NEGATIVE Final    Comment: (NOTE) SARS-CoV-2 target nucleic acids are NOT DETECTED.  The SARS-CoV-2 RNA is generally detectable in upper respiratory specimens during the acute phase of infection. The lowest concentration of SARS-CoV-2 viral copies this assay can detect is 138 copies/mL. A negative result does not preclude SARS-Cov-2 infection and should not be used as the sole basis for treatment or other patient management decisions. A negative result may occur with  improper specimen collection/handling, submission of specimen other than nasopharyngeal swab, presence of viral mutation(s) within the areas targeted by this assay, and inadequate number of viral copies(<138 copies/mL). A negative result must be combined with clinical observations, patient history, and epidemiological information. The expected result is Negative.  Fact Sheet for Patients:  BloggerCourse.com  Fact Sheet for Healthcare Providers:  SeriousBroker.it  This test is no t yet approved or cleared by the [place] FDA and  has been authorized for detection and/or diagnosis of SARS-CoV-2 by FDA under an Emergency Use Authorization (EUA). This EUA will remain  in effect (meaning this test can be used) for the duration of the COVID-19 declaration under Section 564(b)(1) of the Act, 21 U.S.C.section 360bbb-3(b)(1), unless the authorization is terminated  or revoked sooner.       Influenza A by PCR NEGATIVE NEGATIVE Final   Influenza B by PCR NEGATIVE NEGATIVE Final    Comment: (NOTE) The Xpert Xpress SARS-CoV-2/FLU/RSV plus assay is intended as an aid in the diagnosis of influenza from Nasopharyngeal swab specimens and should not be used as a sole basis for treatment.  Nasal washings and aspirates are unacceptable for Xpert Xpress SARS-CoV-2/FLU/RSV testing.  Fact Sheet for Patients: BloggerCourse.com  Fact Sheet for  Healthcare Providers: SeriousBroker.it  This test is not yet approved or cleared by the [place] and has been authorized for detection and/or diagnosis of SARS-CoV-2 by FDA under an Emergency Use Authorization (EUA). This EUA will remain in effect (meaning this test can be used) for the duration of the COVID-19 declaration under Section 564(b)(1) of the Act, 21 U.S.C. section 360bbb-3(b)(1), unless the authorization is terminated or revoked.     Resp Syncytial Virus by PCR NEGATIVE NEGATIVE Final    Comment: (NOTE) Fact Sheet for Patients: BloggerCourse.com  Fact Sheet for Healthcare Providers: SeriousBroker.it  This test is not yet approved or cleared by the [place] FDA and has been authorized for detection and/or diagnosis of SARS-CoV-2 by FDA under an Emergency Use Authorization (EUA). This EUA will remain in effect (meaning this test can be used) for the duration of the COVID-19 declaration under Section 564(b)(1) of the Act, 21 U.S.C. section 360bbb-3(b)(1), unless the authorization is terminated or revoked.  Performed at [facility], [address] [place], [place] [address]   Respiratory (~20 pathogens) panel by PCR     Status: None   Collection Time: 05/21/23  4:32 PM   Specimen: Nasopharyngeal Swab; Respiratory  Result Value Ref Range Status   Adenovirus NOT DETECTED NOT DETECTED Final   Coronavirus 229E NOT DETECTED NOT DETECTED Final    Comment: (NOTE) The Coronavirus on the Respiratory Panel, DOES NOT test for the novel  Coronavirus (2019 nCoV)    Coronavirus HKU1 NOT DETECTED NOT DETECTED Final   Coronavirus NL63 NOT DETECTED NOT DETECTED Final   Coronavirus OC43 NOT  DETECTED NOT DETECTED Final   Metapneumovirus NOT DETECTED NOT DETECTED Final   Rhinovirus / Enterovirus NOT DETECTED NOT DETECTED Final   Influenza A NOT DETECTED NOT DETECTED Final   Influenza B NOT DETECTED NOT DETECTED Final   Parainfluenza Virus 1 NOT DETECTED NOT DETECTED Final   Parainfluenza Virus 2 NOT DETECTED NOT DETECTED Final   Parainfluenza Virus 3 NOT DETECTED NOT DETECTED Final   Parainfluenza Virus 4 NOT DETECTED NOT DETECTED Final   Respiratory Syncytial Virus NOT DETECTED NOT DETECTED Final   Bordetella pertussis NOT DETECTED NOT DETECTED Final   Bordetella Parapertussis NOT DETECTED NOT DETECTED Final   Chlamydophila pneumoniae NOT DETECTED NOT DETECTED Final   Mycoplasma pneumoniae NOT DETECTED NOT DETECTED Final    Comment: Performed at [facility], [address], [place], [place] [address]     Medications:    calcium-vitamin D  1 tablet Oral Q breakfast   dapsone  100 mg Oral Daily   escitalopram  10 mg Oral Daily   feeding supplement  237 mL Oral Q24H   ferrous sulfate  325 mg Oral BID WC   levothyroxine  125 mcg Oral Daily   methylphenidate  2.5 mg Oral Daily   pantoprazole  40 mg Oral Daily   valACYclovir  500 mg Oral Daily   Continuous Infusions:  ceFEPime (MAXIPIME) IV 2 g (05/24/23 0510)      LOS: 4 days   Marinda Elk  [facility]  05/24/2023, 9:35 AM

## 2023-05-24 NOTE — Progress Notes (Signed)
Physical Therapy Treatment Patient Details Name: Andre [name] MRN: [id number] DOB: 1951-06-22 Today's Date: 05/24/2023   History of Present Illness 72 yo male admitted from SNF with AMS, fever, hypoxia, UTI, sepsis. Hx of multiple myeloma s/p CAR-T therapy 09/2022, persistent pancytopenia, respiratory failure, pseudomonas bacterermia, fall    PT Comments   Pt admitted with above diagnosis.  Pt currently with functional limitations due to the deficits listed below (see PT Problem List). Pts daughter present when PT arrived, nurse in room and made PT aware pt sustained unwitnessed fall over night pt indicates trying to get OOB to go to the bathroom, pt struck his head and bruising evident. Pt indicated being a little sore. Pt on RA through out PT intervention today. Pt required CGA and increased time with cues for supine to sit, CGA for sit to stand and CGA for gait tasks up to 400 feet with RW and min cues, LOB apparent x 1 with turns and pt able to implement self recovery strategies. Pt left in recliner, all needs in place with wife and step son present. PT able to discuss d/c planning with wife and pt ALF vs SNF/rehab, wife indicated ALF has specific requirements in order to return and anticipate pt will need continued therapy, <3 hours/day prior to transitioning back to ALF.   Pt will benefit from acute skilled PT to increase their independence and safety with mobility to allow discharge.      If plan is discharge home, recommend the following: Assistance with cooking/housework;Direct supervision/assist for medications management;Direct supervision/assist for financial management;Assist for transportation;A little help with walking and/or transfers;A little help with bathing/dressing/bathroom;Supervision due to cognitive status   Can travel by private vehicle     Yes  Equipment Recommendations  None recommended by PT    Recommendations for Other Services       Precautions / Restrictions  Precautions Precautions: Fall Precaution Comments: monitor O2 Restrictions Weight Bearing Restrictions: No     Mobility  Bed Mobility Overal bed mobility: Needs Assistance Bed Mobility: Supine to Sit     Supine to sit: Contact guard, HOB elevated, Used rails     General bed mobility comments: min cues for sequencing and safety    Transfers Overall transfer level: Needs assistance Equipment used: Rolling walker (2 wheels) Transfers: Sit to/from Stand Sit to Stand: Contact guard assist, From elevated surface   Step pivot transfers: Contact guard assist       General transfer comment: min cues for proper UE placement    Ambulation/Gait Ambulation/Gait assistance: Min assist Gait Distance (Feet): 400 Feet Assistive device: Rolling walker (2 wheels) Gait Pattern/deviations: Step-through pattern, Decreased stride length Gait velocity: decr     General Gait Details: min cues for safety and posture, noted LOB with turns and cues for proper body position inside RW   Stairs             Wheelchair Mobility     Tilt Bed    Modified Rankin (Stroke Patients Only)       Balance Overall balance assessment: Needs assistance Sitting-balance support: Feet supported, Bilateral upper extremity supported Sitting balance-Leahy Scale: Good     Standing balance support: Reliant on assistive device for balance, Bilateral upper extremity supported, During functional activity Standing balance-Leahy Scale: Poor Standing balance comment: CGA with RW                            Cognition Arousal: Alert Behavior  During Therapy: WFL for tasks assessed/performed Overall Cognitive Status: History of cognitive impairments - at baseline Area of Impairment: Memory, Problem solving, Awareness                     Memory: Decreased short-term memory       Problem Solving: Requires verbal cues, Slow processing          Exercises      General  Comments General comments (skin integrity, edema, etc.): O2 saturation on RA at rest semi reclined in bed 90-91% and pt desaturated with exertion to 88% and required 1:32 to recover to 90% on RA wtih cues for pursed lip breathing      Pertinent Vitals/Pain Pain Assessment Pain Assessment: Faces Faces Pain Scale: Hurts a little bit Breathing: normal Negative Vocalization: none Facial Expression: smiling or inexpressive Body Language: relaxed Consolability: no need to console PAINAD Score: 0 Pain Descriptors / Indicators: Sore Pain Intervention(s): Monitored during session, Repositioned    Home Living Family/patient expects to be discharged to:: Skilled nursing facility Living Arrangements: Spouse/significant other               Home Equipment: Agricultural consultant (2 wheels) Additional Comments: Prior to [employer] of this year, he was living at home with his spouse. Since then, he has been in SNF rehab and an ALF.    Prior Function            PT Goals (current goals can now be found in the care plan section) Acute Rehab PT Goals Patient Stated Goal: none stated PT Goal Formulation: With patient Time For Goal Achievement: 06/04/23 Potential to Achieve Goals: Fair    Frequency    Min 1X/week      PT Plan      Co-evaluation              AM-PAC PT "6 Clicks" Mobility   Outcome Measure  Help needed turning from your back to your side while in a flat bed without using bedrails?: A Little Help needed moving from lying on your back to sitting on the side of a flat bed without using bedrails?: A Little Help needed moving to and from a bed to a chair (including a wheelchair)?: A Little Help needed standing up from a chair using your arms (e.g., wheelchair or bedside chair)?: A Little Help needed to walk in hospital room?: A Little Help needed climbing 3-5 steps with a railing? : Total 6 Click Score: 16    End of Session Equipment Utilized During Treatment: Gait  belt Activity Tolerance: Patient tolerated treatment well Patient left: in chair;with call bell/phone within reach;with family/visitor present;with chair alarm set Nurse Communication: Mobility status PT Visit Diagnosis: Difficulty in walking, not elsewhere classified (R26.2);Muscle weakness (generalized) (M62.81);History of falling (Z91.81)     Time: 1610-9604 PT Time Calculation (min) (ACUTE ONLY): 28 min  Charges:    $Gait Training: 8-22 mins $Therapeutic Activity: 8-22 mins PT General Charges $$ ACUTE PT VISIT: 1 Visit                     Johnny Bridge, PT Acute Rehab   Jacqualyn Posey 05/24/2023, 2:06 PM

## 2023-05-24 NOTE — Care Management Important Message (Signed)
Important Message  Patient Details IM Letter given. Name: Andre [name] MRN: [id number] Date of Birth: 08-22-1951   Medicare Important Message Given:  Yes     [name] 05/24/2023, 1:12 PM

## 2023-05-24 NOTE — Progress Notes (Signed)
IP PROGRESS NOTE  Subjective:   Andre [name] denies dyspnea.  No complaint.  No bleeding.  He received IVIG yesterday. . Objective: Vital signs in last 24 hours: Blood pressure (!) 106/94, pulse (!) 59, temperature 97.6 F (36.4 C), temperature source Oral, resp. rate 18, height 5\' 7"  (1.702 m), weight 182 lb 8.7 oz (82.8 kg), SpO2 93%.  Intake/Output from previous day: 09/04 0701 - 09/05 0700 In: 2270 [P.O.:1670; I.V.:300; IV Piggyback:300] Out: 2950 [Urine:2950]  Physical Exam:  HEENT: No thrush, no bleeding Cardiac: Regular rate and rhythm Lungs: Decreased breath sounds at the posterior base bilaterally, no respiratory distress Extremities: No leg edema Neurologic: Alert, oriented to place and diagnosis.   Lab Results: Recent Labs    05/22/23 0420 05/23/23 0413  WBC 4.3 2.4*  HGB 9.5* 8.9*  HCT 29.8* 28.0*  PLT 32* 35*    BMET Recent Labs    05/22/23 0420 05/23/23 0413  NA 137 134*  K 4.0 3.7  CL 108 101  CO2 23 25  GLUCOSE 93 97  BUN 15 21  CREATININE 0.92 0.91  CALCIUM 9.2 8.9    No results found for: "CEA1", "CEA", "XWR604", "CA125"  Studies/Results: CT HEAD WO CONTRAST ( )  Result Date: 05/24/2023 CLINICAL DATA:  Initial evaluation evaluate initial evaluation for trauma, fall. EXAM: CT HEAD WITHOUT CONTRAST TECHNIQUE: Contiguous axial images were obtained from the base of the skull through the vertex without intravenous contrast. RADIATION DOSE REDUCTION: This exam was performed according to the departmental dose-optimization program which includes automated exposure control, adjustment of the mA and/or kV according to patient size and/or use of iterative reconstruction technique. COMPARISON:  Prior study from 05/21/2023. FINDINGS: Brain: Cerebral volume within normal limits. Moderately advanced chronic microvascular ischemic disease with small remote left cerebellar infarct. No acute intracranial hemorrhage. No acute large vessel territory infarct. No  mass lesion or midline shift. No hydrocephalus or extra-axial fluid collection. Vascular: No abnormal hyperdense vessel. Scattered calcified atherosclerosis present at the skull base. Skull: Scalp soft tissues demonstrate no acute finding. Calvarium intact. Innumerable lucency seen throughout the calvarium, consistent with history of multiple myeloma. Sinuses/Orbits: Globes and orbital soft tissues demonstrate no acute finding. Mild mucosal thickening about the ethmoidal air cells. Paranasal sinuses are otherwise clear. No mastoid effusion. Other: None. IMPRESSION: 1. No acute intracranial abnormality. 2. Moderately advanced chronic microvascular ischemic disease with small remote left cerebellar infarct. 3. Innumerable lucency throughout the calvarium, consistent with history of multiple myeloma. Electronically Signed   By: Rise Mu M.D.   On: 05/24/2023 01:56    Medications: I have reviewed the patient's current medications.  Assessment/Plan: Multiple myeloma- IgG lambda  bortezomib (subcutaneously), lenalidomide, and dexamethasone, with repeat bone marrow biopsy May of 2012 showing 10% plasmacytosis   (2) high-dose chemotherapy with BCNU and melphalan at [facility], followed by stem cell rescue July of 2012   (3) on zoledronic acid started December of 2012, initially monthly, currently given every 3 months, most recent dose  12/07/2015   (4) low-dose lenalidomide resumed April 2013, interrupted several times.  Resumed again on 02/19/2013, ata dose of 5 mg daily, 21 days on and 7 days off, later further reduced to 7 days on, 7 days off   (5) CNS symptoms and abnormal brain MRI extensively evaluated by neurology with no definitive diagnosis established   (6) rising M spike noted June 2014 but did not meet criteria for carfilzomib study   (7) on lenalidomide 25 mg daily, 14 days on, 7 days  off, starting 04/18/2013, interrupted December 2014 because of rash;             (a) resumed January 2015  at 10 mg/ day at 21 days on/ 7 days off             (b) starting 08/18/2014 decreased to 10 mg/ day 14 days on, 7 days off because of cytopenias             (c) as of February 2016 was on 5 mg daily 7 days on 7 days off             (d) lenalidomide discontinued December 2016 with evidence of disease progression   (8) transient global amnesia 05/29/2015, resolved without intervention   (9) starting PVD 10/25/2015 w ASA 325 thromboprophylaxis, valacyclovir prophylaxis, last dose 12/17/2015             (a) pomalidomide 4 mg/d days 1-14             (b) bortezomib sQ days 2,5,9,12 of each 21 day cycle             (c) dexamethasome 20 mg two days a week             (d) dexamethasone bortezomib and pomalidomide discontinued late December 2018 with poor tolerance   (10) metapneumovirus pneumonia April 2017             (a) completing course of steroids and week of bactrim mid April 2017   (11) status post second autologous transplant at [facility] 02/04/2016(preparatory regimen melphalan 200 mg/m)             (a) received twelve-month vaccinations 03/14/2017 (DPT, Haemophilus, Pneumovax 13, polio)             (b) 14 months injections 05/04/2016 include DTaP, Hib conjugate, HepB energex B 20 mcg/ml, Prevnar 13             (c) 24 month vaccines due at [facility] June 2019   (12) maintenance therapy started November 2017, consisting of             (a) bortezomib 1.3 mg/M2 every 14 days, first dose 07/27/2016             (b) pomalidomide 1 mg days 1-21 Q28 days, started 07/19/2016             (c) zolendronate monthly started 07/27/2016 (previously Q 3 months) however patient unable to tolerate, and changed back to q3 months in April, 2018             (d) Bortezomib changed to monthly as of June 2018 because of tolerance issues, however discontinued after 09/14/2017 dose because of a rise in his M spike             (e) pomalidomide held after 10/18/2017 in preparation for possible study at [facility] (venetoclax)--never  resumed             (f) with numbers actually improved off treatment, resumed every 2-week bortezomib 12/25/2017             (g) changed to every 3-week bortezomib as of 03/17/2019             (h) briefly on weekly treatments times 22 October 2020 due to increase in M spike             (I) maintenance therapy discontinued with evidence of progression   (13) bortezomib/daratumumab/dexamethasone started 12/20/2020.             (  a) Decadron dose dropped to 10 mg day of and day following treatment starting May 2022             (b) day 8 bortezomib omitted beginning with the June cycle per patient preference             (C) treatment changed to monthly daratumumab/Velcade/Decadron beginning 06/27/2021-patient decision (14) weekly Velcade/Cytoxan beginning 11/14/2021 (15) changed to Cytoxan/carfilzomib/Decadron 12/19/2021-treatment placed on hold 05/15/2022, carfilzomib 05/24/2022 and 05/31/2022 16.  Leukocyte a pheresis procedure at [facility] 06/15/2022 17.  Treatment resumed with Cytoxan/carfilzomib/Decadron 06/28/2022, last given 09/06/2022 18.  CAR-T 09/26/2022 with Carvykti 19.  CRS and ICANS following CAR-T therapy, status post a Decadron taper completed 10/08/2022 20.  Presentation to the emergency room 10/30/2022 with failure to thrive and a fall  21.  Pancytopenia secondary to multiple myeloma and CAR-T therapy-improved 22.  Bone marrow biopsy 11/13/2022-mildly hypocellular bone marrow with relative erythroid hyperplasia and decreased megakaryocytes; no plasma cells identified by differential count or CD138 immunohistochemical stain; flow cytometry negative for a clonal plasma cell population.  The bone marrow biopsy 05/10/2023-mildly hypercellular marrow with mild decrease in megakaryocytes, plasma cells not increased, absent iron stores 23.  Cognitive impairment predating CAR-T therapy and worsened following CAR-T therapy 24.  Admission 05/05/2023 with Pseudomonas sepsis CTs chest, abdomen, and  pelvis-right lung base atelectasis, no infiltrate 25.  Admission 05/20/2023 with hypoxia, altered mental status, and fever Infectious disease evaluation on hospital admission consistent with a urinary tract infection, urine culture positive for Pseudomonas aeruginosa 26.  Hypogammaglobulinemia secondary to multiple myeloma and CAR-T therapy   Andre [name] has multiple myeloma.  He underwent CAR-T therapy in January.  He is in remission from myeloma, but has persistent pancytopenia.  He has hypogammaglobulinemia and receives monthly IVIG therapy at the cancer center.  He was admitted 05/05/2023 Pseudomonas bacteremia/sepsis.  No clear source for infection was identified.  He completed an outpatient course of ciprofloxacin.   He has persistent pancytopenia following CAR-T therapy.  He had a bone marrow biopsy 05/10/2023.  No evidence of myeloma.  Iron stores were diminished.  Cytogenetics and a myeloma FISH panel are pending.  Andre [name] was readmitted on 05/20/2023 with altered mental status, hypoxia, and fever.  Urinalysis was consistent with an infection and the culture returned positive for Pseudomonas.  He continues antibiotics, he had a low-grade fever last night.  The immunoglobulin levels remain low.  He received IVIG yesterday.  Andre [name] should be able to return to the skilled nursing unit at [facility].  Recommendations: Continue antibiotics for the Pseudomonas urinary tract infection Wean oxygen as tolerated Transfuse platelets for bleeding or a count of less than 10,000 Continue dapsone and valacyclovir prophylaxis Outpatient follow-up will be scheduled at the Cancer center   LOS: 4 days   Thornton Papas, MD   05/24/2023, 6:46 AM

## 2023-05-25 DIAGNOSIS — B965 Pseudomonas (aeruginosa) (mallei) (pseudomallei) as the cause of diseases classified elsewhere: Secondary | ICD-10-CM

## 2023-05-25 DIAGNOSIS — D801 Nonfamilial hypogammaglobulinemia: Secondary | ICD-10-CM

## 2023-05-25 DIAGNOSIS — N390 Urinary tract infection, site not specified: Secondary | ICD-10-CM | POA: Diagnosis not present

## 2023-05-25 DIAGNOSIS — A419 Sepsis, unspecified organism: Secondary | ICD-10-CM | POA: Diagnosis not present

## 2023-05-25 DIAGNOSIS — C9000 Multiple myeloma not having achieved remission: Secondary | ICD-10-CM | POA: Diagnosis not present

## 2023-05-25 LAB — CULTURE, BLOOD (ROUTINE X 2)
Culture: NO GROWTH
Culture: NO GROWTH
Special Requests: ADEQUATE

## 2023-05-25 MED ORDER — CIPROFLOXACIN HCL 500 MG PO TABS: 500.0000 mg | ORAL_TABLET | Freq: Two times a day (BID) | ORAL | 0 refills | Status: AC

## 2023-05-25 MED ORDER — CIPROFLOXACIN HCL 500 MG PO TABS: 500.0000 mg | ORAL_TABLET | Freq: Two times a day (BID) | ORAL | Status: DC

## 2023-05-25 NOTE — Plan of Care (Signed)
  Problem: Education: Goal: Knowledge of General Education information will improve Description: Including pain rating scale, medication(s)/side effects and non-pharmacologic comfort measures Outcome: Progressing   Problem: Clinical Measurements: Goal: Ability to maintain clinical measurements within normal limits will improve Outcome: Progressing Goal: Diagnostic test results will improve Outcome: Progressing   Problem: Activity: Goal: Risk for activity intolerance will decrease Outcome: Progressing   Problem: Safety: Goal: Ability to remain free from injury will improve Outcome: Progressing

## 2023-05-25 NOTE — Discharge Summary (Signed)
Physician Discharge Summary  Andre [name] HQI:[id number] DOB: 08/16/1951 DOA: 05/20/2023  PCP: [name], MD  Admit date: 05/20/2023 Discharge date: 05/25/2023  Admitted From: SNF Disposition:  SNF  Recommendations for Outpatient Follow-up:  Follow up with PCP in 1-2 weeks Please obtain BMP/CBC in one week   Home Health:No Equipment/Devices:None  Discharge Condition:Stable CODE STATUS:Full Diet recommendation: Heart Healthy   Brief/Interim Summary: 72 y.o. male past medical history of multiple myeloma in remission after stem cell transplant and recent CAR-T therapy in January 2024, hypogammaglobulinemia on monthly IVIG, pancytopenia, cognitive impairment recently discharged from the hospital on 05/11/2023 for Pseudomonas bacteremia for which he was discharged on Cipro comes in for altered mental status hypoxia and fever, likely due to sepsis satting 83% on room air.  CT angio of the chest was negative for PE or pneumonia.  COVID-19 influenza and SARS-CoV-2 PCR were negative.  Started on broad-spectrum antibiotics and oncology was consulted.  Grew Pseudomonas currently on cefepime and Zithromax   Discharge Diagnoses:  Principal Problem:   Severe sepsis (HCC) Active Problems:   Acute cystitis without hematuria   Acute metabolic encephalopathy   Hypothyroidism   Thrombocytopenia (HCC)   Multiple myeloma not having achieved remission (HCC)   GERD (gastroesophageal reflux disease)   Acute hypoxemic respiratory failure (HCC)   DNR (do not resuscitate)/DNI(Do Not Intubate)   Moderate-to-severe aortic regurgitation   Macrocytic anemia  Severe sepsis possibly due to recurrent UTI Pseudomonas: Urine culture grew Pseudomonas pansensitive, on admission he was started on IV cefepime. Blood cultures remain negative till date he defervesced he was transition to oral Cipro which she will continue as an outpatient.  Acute metabolic encephalopathy:  likely due to infectious etiology  continue dysphagia 3 diet.  Acute respiratory failure with hypoxia likely due to aspiration: On admission was on 5 L he was placed on cefepime he defervesced with we were able to wean him to room air. PT evaluated the patient he will need to go back to skilled nursing facility.  Unwitnessed fall: While patient attempting to get out of bed CT showed no acute findings. This happened overnight physical therapy reevaluated the patient he will need to go to rehab.  Aortic regurgitation: Follow-up with cardiology as an outpatient.  Large hiatal hernia : Continue Protonix.  Multiple myeloma in remission: Follow-up with oncology as an outpatient.  Pancytopenia: Platelets,white blood cell count and hemoglobin remained relatively stable, there were no needs to transfuse.  Hypothyroidism: Send continue Synthroid.  Physical deconditioning. Will need to go to skilled nursing facility    Discharge Instructions  Discharge Instructions     Diet - low sodium heart healthy   Complete by: As directed    Increase activity slowly   Complete by: As directed       Allergies as of 05/25/2023       Reactions   Atorvastatin Other (See Comments)   Rosuvastatin    Other reaction(s): Liver Disorder   Crestor [rosuvastatin Calcium]    ADVERSE EFFECTS ON LIVER   Lipitor [atorvastatin Calcium]    Septra [sulfamethoxazole-trimethoprim] Rash        Medication List     TAKE these medications    acetaminophen 650 MG CR tablet Commonly known as: TYLENOL Take 650 mg by mouth every 6 (six) hours as needed for pain.   calcium carbonate 750 MG chewable tablet Commonly known as: TUMS EX Chew 2 tablets by mouth 3 (three) times daily as needed for heartburn.   calcium-vitamin D  500-200 MG-UNIT tablet Commonly known as: OSCAL WITH D Take 1 tablet by mouth daily.   ciprofloxacin 500 MG tablet Commonly known as: CIPRO Take 1 tablet (500 mg total) by mouth 2 (two) times daily for 5 days.    cyanocobalamin 1000 MCG tablet Commonly known as: VITAMIN B12 Take 1 tablet (1,000 mcg total) by mouth daily.   dapsone 100 MG tablet Take 100 mg by mouth daily.   docusate sodium 100 MG capsule Commonly known as: COLACE Take 100 mg by mouth daily as needed for mild constipation or moderate constipation.   escitalopram 10 MG tablet Commonly known as: LEXAPRO Take 1 tablet (10 mg total) by mouth daily.   ferrous sulfate 325 (65 FE) MG EC tablet Take 325 mg by mouth 2 (two) times daily.   folic acid 1 MG tablet Commonly known as: FOLVITE Take 1 tablet (1 mg total) by mouth daily.   ipratropium-albuterol 0.5-2.5 (3) MG/3ML Soln Commonly known as: DUONEB Take 3 mLs by nebulization 2 (two) times daily as needed for up to 14 days.   levothyroxine 125 MCG tablet Commonly known as: SYNTHROID Take 125 mcg by mouth daily.   methylphenidate 5 MG tablet Commonly known as: RITALIN Take 0.5 tablets (2.5 mg total) by mouth daily. Each morning   pantoprazole 40 MG tablet Commonly known as: Protonix Take 1 tablet (40 mg total) by mouth daily.   valACYclovir 500 MG tablet Commonly known as: VALTREX Take 500 mg by mouth daily.   Vitamin D3 25 MCG (1000 UT) Caps Take 1,000 Units by mouth daily.        Allergies  Allergen Reactions   Atorvastatin Other (See Comments)   Rosuvastatin     Other reaction(s): Liver Disorder   Crestor [Rosuvastatin Calcium]     ADVERSE EFFECTS ON LIVER   Lipitor [Atorvastatin Calcium]    Septra [Sulfamethoxazole-Trimethoprim] Rash    Consultations: Oncology   Procedures/Studies: CT HEAD WO CONTRAST ( )  Result Date: 05/24/2023 CLINICAL DATA:  Initial evaluation evaluate initial evaluation for trauma, fall. EXAM: CT HEAD WITHOUT CONTRAST TECHNIQUE: Contiguous axial images were obtained from the base of the skull through the vertex without intravenous contrast. RADIATION DOSE REDUCTION: This exam was performed according to the departmental  dose-optimization program which includes automated exposure control, adjustment of the mA and/or kV according to patient size and/or use of iterative reconstruction technique. COMPARISON:  Prior study from 05/21/2023. FINDINGS: Brain: Cerebral volume within normal limits. Moderately advanced chronic microvascular ischemic disease with small remote left cerebellar infarct. No acute intracranial hemorrhage. No acute large vessel territory infarct. No mass lesion or midline shift. No hydrocephalus or extra-axial fluid collection. Vascular: No abnormal hyperdense vessel. Scattered calcified atherosclerosis present at the skull base. Skull: Scalp soft tissues demonstrate no acute finding. Calvarium intact. Innumerable lucency seen throughout the calvarium, consistent with history of multiple myeloma. Sinuses/Orbits: Globes and orbital soft tissues demonstrate no acute finding. Mild mucosal thickening about the ethmoidal air cells. Paranasal sinuses are otherwise clear. No mastoid effusion. Other: None. IMPRESSION: 1. No acute intracranial abnormality. 2. Moderately advanced chronic microvascular ischemic disease with small remote left cerebellar infarct. 3. Innumerable lucency throughout the calvarium, consistent with history of multiple myeloma. Electronically Signed   By: Rise Mu M.D.   On: 05/24/2023 01:56   MR BRAIN WO CONTRAST  Result Date: 05/21/2023 CLINICAL DATA:  Delirium EXAM: MRI HEAD WITHOUT CONTRAST TECHNIQUE: Multiplanar, multiecho pulse sequences of the brain and surrounding structures were obtained without intravenous contrast. COMPARISON:  MRI June 2024. FINDINGS: Brain: No acute infarction, hemorrhage, hydrocephalus, extra-axial collection or mass lesion. Moderate for age T2/FLAIR hyperintensities the white matter, nonspecific but compatible with chronic microvascular ischemic disease. Cerebral atrophy. Remote cerebellar infarct. Vascular: Major arterial flow voids are maintained at the  skull base. Skull and upper cervical spine: Heterogeneous bone marrow. Sinuses/Orbits: Clear sinuses.  No acute orbital findings. Other: No mastoid effusions. IMPRESSION: 1. No acute abnormality. 2. Chronic microvascular ischemic disease and remote cerebellar infarct. 3. Heterogeneous bone marrow, compatible with reported history multiple myeloma. Electronically Signed   By: Feliberto Harts M.D.   On: 05/21/2023 15:59   CT Angio Chest PE W and/or Wo Contrast  Result Date: 05/20/2023 CLINICAL DATA:  Pulmonary embolism (PE) suspected, high prob. Found altered. Responsive to pain. EXAM: CT ANGIOGRAPHY CHEST WITH CONTRAST TECHNIQUE: Multidetector CT imaging of the chest was performed using the standard protocol during bolus administration of intravenous contrast. Multiplanar CT image reconstructions and MIPs were obtained to evaluate the vascular anatomy. RADIATION DOSE REDUCTION: This exam was performed according to the departmental dose-optimization program which includes automated exposure control, adjustment of the mA and/or kV according to patient size and/or use of iterative reconstruction technique. CONTRAST:  75mL OMNIPAQUE IOHEXOL 350 MG/ML SOLN COMPARISON:  05/05/2023 FINDINGS: Cardiovascular: No filling defects in the pulmonary arteries to suggest pulmonary emboli. Heart is enlarged. Coronary artery and aortic atherosclerosis. No aneurysm. Mediastinum/Nodes: No mediastinal, hilar, or axillary adenopathy. Trachea and esophagus are unremarkable. Thyroid unremarkable. Large hiatal hernia. Lungs/Pleura: Elevation and eventration of the right hemidiaphragm. Bibasilar atelectasis. No effusions. Upper Abdomen: No acute findings. Small layering gallstone within the gallbladder. Musculoskeletal: Chest wall soft tissues are unremarkable. Multiple old healing bilateral rib fractures. Chronic mild T12 compression fracture, stable. Chronic patchy lucencies throughout the osseous structures, unchanged. Review of the  MIP images confirms the above findings. IMPRESSION: No evidence of pulmonary embolus. Cardiomegaly, coronary artery disease. Bibasilar atelectasis. Large hiatal hernia. Stable chronic patchy lucencies throughout the osseous structures likely related to multiple myeloma. No acute bony abnormality. Aortic Atherosclerosis (ICD10-I70.0). Electronically Signed   By: Charlett Nose M.D.   On: 05/20/2023 20:16   CT Head Wo Contrast  Result Date: 05/20/2023 CLINICAL DATA:  Found altered prior to arrival. Mental status change. Responsive to pain. EXAM: CT HEAD WITHOUT CONTRAST TECHNIQUE: Contiguous axial images were obtained from the base of the skull through the vertex without intravenous contrast. RADIATION DOSE REDUCTION: This exam was performed according to the departmental dose-optimization program which includes automated exposure control, adjustment of the mA and/or kV according to patient size and/or use of iterative reconstruction technique. COMPARISON:  CT head 04/20/2023 FINDINGS: Brain: No intracranial hemorrhage, mass effect, or evidence of acute infarct. No hydrocephalus. No extra-axial fluid collection. Age-commensurate cerebral atrophy and ill-defined hypoattenuation within the cerebral white matter consistent with chronic small vessel ischemic disease. Chronic left cerebellar infarct. Vascular: No hyperdense vessel. Intracranial arterial calcification. Skull: No fracture. Multiple lucencies are present throughout the calvarium, similar to prior Sinuses/Orbits: No acute finding. Paranasal sinuses and mastoid air cells are well aerated. Other: None. IMPRESSION: 1. Unchanged CT head from 04/20/2023. No acute intracranial abnormality. Electronically Signed   By: Minerva Fester M.D.   On: 05/20/2023 17:32   DG Chest Portable 1 View  Result Date: 05/20/2023 CLINICAL DATA:  Hypoxia. Concern for pneumonia. Earlier today was at baseline but found altered prior to arrival. EXAM: PORTABLE CHEST 1 VIEW COMPARISON:   Chest radiographs 05/06/2023, 05/05/2023, 01/11/2023; CT chest, abdomen, and pelvis 05/05/2023  FINDINGS: There is again moderate elevation of the right hemidiaphragm. Cardiac silhouette is at the upper limits of normal size for AP technique. Retrocardiac opacity is seen corresponding to the known large hiatal hernia better seen on prior CT. Mild bibasilar linear subsegmental atelectasis is similar to prior. No pneumothorax. Old healed posterior right rib fractures. IMPRESSION: 1. Mild bibasilar linear subsegmental atelectasis is similar to prior. 2. Large hiatal hernia. Electronically Signed   By: Neita Garnet M.D.   On: 05/20/2023 17:32   CT BONE MARROW BIOPSY & ASPIRATION  Result Date: 05/10/2023 INDICATION: History of multiple myeloma, now with pancytopenia. Please perform CT-guided bone marrow biopsy for tissue diagnostic purposes. EXAM: CT-GUIDED BONE MARROW BIOPSY AND ASPIRATION MEDICATIONS: None ANESTHESIA/SEDATION: Moderate (conscious) sedation was employed during this procedure as administered by the Interventional Radiology RN. A total of Versed 1 mg and Fentanyl 75 mcg was administered intravenously. Moderate Sedation Time: 10 minutes. [name] level of consciousness and vital signs were monitored continuously by radiology nursing throughout the procedure under my direct supervision. COMPLICATIONS: None immediate. PROCEDURE: Informed consent was obtained from the patient following an explanation of the procedure, risks, benefits and alternatives. The patient understands, agrees and consents for the procedure. All questions were addressed. A time out was performed prior to the initiation of the procedure. The patient was positioned prone and non-contrast localization CT was performed of the pelvis to demonstrate the iliac marrow spaces. The operative site was prepped and draped in the usual sterile fashion. Under sterile conditions and local anesthesia, a 22 gauge spinal needle was utilized for  procedural planning. Next, an 11 gauge coaxial bone biopsy needle was advanced into the left iliac marrow space. Needle position was confirmed with CT imaging. Initially, a bone marrow aspiration was performed. Next, a bone marrow biopsy was obtained with the 11 gauge outer bone marrow device. Samples were prepared with the cytotechnologist and deemed adequate. The needle was removed and superficial hemostasis was obtained with manual compression. A dressing was applied. The patient tolerated the procedure well without immediate post procedural complication. IMPRESSION: Successful CT guided left iliac bone marrow aspiration and core biopsy. Electronically Signed   By: Simonne Come M.D.   On: 05/10/2023 11:18   DG Swallowing Func-Speech Pathology  Result Date: 05/09/2023 Table formatting from the original result was not included. Modified Barium Swallow Study Patient Details Name: Andre [name] MRN: [id number] Date of Birth: 1951/04/15 Today's Date: 05/09/2023 HPI/PMH: HPI: Patient is a 72 y.o. male with PMH: GERD, HTN, HLD, thyroid disease, multiple myeloma, large hiatal hernia, dysphagia. He had an MBS at a different hospital with SLP reporting penetration with thin liquids via straw sips but not with cup sips. He presented to the hospital on 05/05/23 after being found down in his bed with gurgling respirations, emesis and was suctioned for large amount of gastric contents; he was hypoxemic and found to have aspiration PNA. Clinical Impression: Clinical Impression: Pt presents with a mild oropharyngeal dysphagia characterized primarily by impaired timing for which he compensates for independently. Pt has trace vallecular residue with thicker consistencies, which he senses and independently clears his thoat intermittently to clear from his pharynx. With larger boluses, the head of the bolus reaches the laryngeal vestibule prior to epiglottic inversion and laryngeal elevation being initiated and results in trace,  transient penetration (PAS 2) of thin liquids and nectar thick liquids via cup and straw. This is considered a normal age variant of swallowing and appears consistent with pt's previous MBS (  10/20/22). This appeared to resolve with small, single cup sips. No further penetration or aspiration noted with honey thick liquids, purees, or solids. Pt was given the pill with thin liquids, which was noted to be retained in the esophagus during the flouro sweep. SLP provided subsequent bites of purees x2, which cleared the pill from the esophagus completely. Recommend pt continue diet of Dys 3 textures with thin liquids via small, controlled cup sips. No further SLP f/u is necessary at this time. Factors that may increase risk of adverse event in presence of aspiration [name] & Clearance Coots 2021): Factors that may increase risk of adverse event in presence of aspiration [name] & Clearance Coots 2021): Reduced cognitive function Recommendations/Plan: Swallowing Evaluation Recommendations Swallowing Evaluation Recommendations Recommendations: PO diet PO Diet Recommendation: Dysphagia 3 (Mechanical soft); Thin liquids (Level 0) Liquid Administration via: Cup; No straw Medication Administration: Whole meds with puree Supervision: Staff to assist with self-feeding; Full supervision/cueing for swallowing strategies Swallowing strategies  : Minimize environmental distractions; Slow rate; Small bites/sips Postural changes: Position pt fully upright for meals Oral care recommendations: Oral care BID (2x/day) Treatment Plan Treatment Plan Treatment recommendations: No treatment recommended at this time Follow-up recommendations: No SLP follow up Functional status assessment: Patient has not had a recent decline in their functional status. Recommendations Recommendations for follow up therapy are one component of a multi-disciplinary discharge planning process, led by the attending physician.  Recommendations may be updated based on patient status,  additional functional criteria and insurance authorization. Assessment: Orofacial Exam: Orofacial Exam Oral Cavity: Oral Hygiene: WFL Oral Cavity - Dentition: Adequate natural dentition Orofacial Anatomy: WFL Oral Motor/Sensory Function: WFL Anatomy: Anatomy: Suspected cervical osteophytes Boluses Administered: Boluses Administered Boluses Administered: Thin liquids (Level 0); Mildly thick liquids (Level 2, nectar thick); Moderately thick liquids (Level 3, honey thick); Puree; Solid  Oral Impairment Domain: Oral Impairment Domain Lip Closure: No labial escape Tongue control during bolus hold: Cohesive bolus between tongue to palatal seal Bolus preparation/mastication: Timely and efficient chewing and mashing Bolus transport/lingual motion: Brisk tongue motion Oral residue: Complete oral clearance Location of oral residue : N/A Initiation of pharyngeal swallow : Valleculae  Pharyngeal Impairment Domain: Pharyngeal Impairment Domain Soft palate elevation: No bolus between soft palate (SP)/pharyngeal wall (PW) Laryngeal elevation: Complete superior movement of thyroid cartilage with complete approximation of arytenoids to epiglottic petiole Anterior hyoid excursion: Complete anterior movement Epiglottic movement: Complete inversion Laryngeal vestibule closure: Incomplete, narrow column air/contrast in laryngeal vestibule Pharyngeal stripping wave : Present - complete Pharyngeal contraction (A/P view only): N/A Pharyngoesophageal segment opening: Complete distension and complete duration, no obstruction of flow Tongue base retraction: No contrast between tongue base and posterior pharyngeal wall (PPW) Pharyngeal residue: Trace residue within or on pharyngeal structures Location of pharyngeal residue: Valleculae  Esophageal Impairment Domain: Esophageal Impairment Domain Esophageal clearance upright position: Esophageal retention Pill: Pill Consistency administered: Thin liquids (Level 0); Puree Thin liquids (Level 0):  Impaired (see clinical impressions) Puree: Impaired (see clinical impressions) Penetration/Aspiration Scale Score: Penetration/Aspiration Scale Score 1.  Material does not enter airway: Moderately thick liquids (Level 3, honey thick); Puree; Solid; Pill 2.  Material enters airway, remains ABOVE vocal cords then ejected out: Thin liquids (Level 0); Mildly thick liquids (Level 2, nectar thick) Compensatory Strategies: Compensatory Strategies Compensatory strategies: Yes Straw: Ineffective Ineffective Straw: Thin liquid (Level 0)   General Information: Caregiver present: No  Diet Prior to this Study: Dysphagia 3 (mechanical soft); Thin liquids (Level 0)   Temperature : Normal   Respiratory Status:  WFL   Supplemental O2: Nasal cannula   History of Recent Intubation: No  Behavior/Cognition: Alert; Cooperative; Pleasant mood Self-Feeding Abilities: Needs assist with self-feeding Baseline vocal quality/speech: Normal Volitional Cough: Able to elicit Volitional Swallow: Able to elicit Exam Limitations: No limitations Goal Planning: Prognosis for improved oropharyngeal function: Good Barriers to Reach Goals: Cognitive deficits No data recorded Patient/Family Stated Goal: nothing specific No data recorded Pain: Pain Assessment Pain Assessment: No/denies pain End of Session: Start Time:SLP Start Time (ACUTE ONLY): 0935 Stop Time: SLP Stop Time (ACUTE ONLY): 0958 Time Calculation:SLP Time Calculation (min) (ACUTE ONLY): 23 min Charges: SLP Evaluations $ SLP Speech Visit: 1 Visit SLP Evaluations $BSS Swallow: 1 Procedure $MBS Swallow: 1 Procedure SLP visit diagnosis: SLP Visit Diagnosis: Dysphagia, pharyngeal phase (R13.13) Past Medical History: Past Medical History: Diagnosis Date  Allergy   Anemia   Arthritis   Asthma   no treatment x 20 years  Depression   Double vision   occurs at times   Duodenal ulcer   GERD (gastroesophageal reflux disease)   Hyperlipidemia   Hypertension   Hypothyroidism   Multiple myeloma 07/04/2011   Thyroid disease  Past Surgical History: Past Surgical History: Procedure Laterality Date  BONE MARROW TRANSPLANT  2011  for MM  CARDIOLITE STUDY  11/25/2003  NORMAL  TONSILLECTOMY   Gwynneth Aliment, M.A., CF-SLP Speech Language Pathology, Acute Rehabilitation Services Secure Chat preferred [phone] 05/09/2023, 1:49 PM  DG Chest Port 1 View  Result Date: 05/06/2023 CLINICAL DATA:  Respiratory failure. EXAM: PORTABLE CHEST 1 VIEW COMPARISON:  05/05/2023 FINDINGS: Stable asymmetric elevation right hemidiaphragm. Left base collapse/consolidation noted. Tiny effusions not excluded. Cardiopericardial silhouette is at upper limits of normal for size. No acute bony abnormality. Telemetry leads overlie the chest. IMPRESSION: Bibasilar collapse/consolidation, left greater than right. Asymmetric elevation right hemidiaphragm. Electronically Signed   By: Kennith Center M.D.   On: 05/06/2023 06:29   CT CHEST ABDOMEN PELVIS WO CONTRAST  Result Date: 05/05/2023 CLINICAL DATA:  Sepsis.  Multiple myeloma.  * Tracking Code: BO * EXAM: CT CHEST, ABDOMEN AND PELVIS WITHOUT CONTRAST TECHNIQUE: Multidetector CT imaging of the chest, abdomen and pelvis was performed following the standard protocol without IV contrast. RADIATION DOSE REDUCTION: This exam was performed according to the departmental dose-optimization program which includes automated exposure control, adjustment of the mA and/or kV according to patient size and/or use of iterative reconstruction technique. COMPARISON:  Chest radiograph from earlier today. 12/23/2015 chest CT. 11/07/2015 CT abdomen/pelvis. FINDINGS: CT CHEST FINDINGS Substantially motion degraded scan, limiting assessment. Cardiovascular: Normal heart size. No significant pericardial effusion/thickening. Three-vessel coronary atherosclerosis. Atherosclerotic nonaneurysmal thoracic aorta. Normal caliber pulmonary arteries. Mediastinum/Nodes: No significant thyroid nodules. Mildly patulous fluid-filled  lower thoracic esophagus. No pathologically enlarged axillary, mediastinal or hilar lymph nodes, noting limited sensitivity for the detection of hilar adenopathy on this noncontrast study. Lungs/Pleura: No pneumothorax. No pleural effusion. Prominent eventration of the right hemidiaphragm with moderate passive right lung base atelectasis. Moderate compressive atelectasis in the medial left lower lobe from the large hiatal hernia. Otherwise no acute consolidative airspace disease, lung masses or gross pulmonary nodules on the motion degraded images. Musculoskeletal: Healed lateral right sixth through eighth rib fractures. Incompletely healed posterolateral left eighth and ninth rib fractures. Chronic patchy lucencies throughout visualized osseous structures, not appreciably changed. No new discrete lesions in the chest. Chronic mild superior T12 vertebral compression fracture, unchanged. CT ABDOMEN PELVIS FINDINGS Hepatobiliary: Mild diffuse hepatic steatosis. Normal liver size. No liver masses. Cholelithiasis. No gallbladder wall thickening  or pericholecystic fluid. No biliary ductal dilatation. Pancreas: Normal, with no mass or duct dilation. Spleen: Normal size. No mass. Adrenals/Urinary Tract: Normal adrenals. No hydronephrosis. No renal stones. Simple 1.5 cm lower left renal cyst, for which no follow-up imaging is recommended. No additional contour deforming renal lesions. Normal bladder. Stomach/Bowel: Large hiatal hernia. Stomach is not significantly distended. No acute gastric abnormality on these noncontrast motion degraded images. Small right inguinal hernia contains a small bowel loop, chronic and unchanged, with no dilated or thick-walled small bowel loops. No focal small bowel caliber transition. No pneumatosis. Appendix not discretely visualized. Mild sigmoid diverticulosis with no large bowel wall thickening or significant pericolonic fat stranding. Vascular/Lymphatic: Atherosclerotic nonaneurysmal  abdominal aorta. No pathologically enlarged lymph nodes in the abdomen or pelvis. Reproductive: Normal size prostate. Other: No pneumoperitoneum, ascites or focal fluid collection. Musculoskeletal: Mild patchy lucencies throughout the spine and pelvic girdle, chronic and not appreciably changed. No new focal osseous lesions. Moderate multilevel lumbar degenerative disc disease. IMPRESSION: 1. Substantially motion degraded scan, limiting assessment. No acute abnormality in the chest, abdomen or pelvis. 2. Chronic small right inguinal hernia containing a small bowel loop, with no evidence of acute bowel complication. No evidence of bowel obstruction or acute bowel inflammation. Mild sigmoid diverticulosis. 3. Prominent eventration of the right hemidiaphragm with moderate passive right lung base atelectasis. 4. Large hiatal hernia with moderate compressive atelectasis in the medial left lower lobe. 5. Three-vessel coronary atherosclerosis. 6. Mild diffuse hepatic steatosis. 7. Cholelithiasis. 8. Chronic patchy lucencies throughout the visualized osseous structures, compatible with reported history of multiple myeloma. No new focal osseous lesions. Chronic mild superior T12 vertebral compression fracture. 9.  Aortic Atherosclerosis (ICD10-I70.0). Electronically Signed   By: Delbert Phenix M.D.   On: 05/05/2023 19:20   DG Chest Portable 1 View  Result Date: 05/05/2023 CLINICAL DATA:  Dyspnea EXAM: PORTABLE CHEST 1 VIEW COMPARISON:  01/11/2023 chest radiograph. FINDINGS: Right rotated chest radiograph. Stable mild cardiomegaly. Left retrocardiac lucency, suspect at least moderate hiatal hernia, unchanged. Stable mediastinal contour. No pneumothorax. No pleural effusion. Chronic prominent elevation of the right hemidiaphragm. No pulmonary edema. Mild bibasilar atelectasis. No acute consolidative airspace disease. IMPRESSION: 1. Stable mild cardiomegaly without pulmonary edema. 2. Chronic prominent elevation of the right  hemidiaphragm with mild bibasilar atelectasis. 3. Stable left retrocardiac lucency, suspect at least moderate hiatal hernia. Electronically Signed   By: Delbert Phenix M.D.   On: 05/05/2023 17:43   (Echo, Carotid, EGD, Colonoscopy, ERCP)    Subjective: No complaints  Discharge Exam: Vitals:   05/24/23 1941 05/25/23 0446  BP: (!) 114/57 (!) 145/60  Pulse: 77 61  Resp:    Temp: 98.7 F (37.1 C) 98.7 F (37.1 C)  SpO2: 90% 90%   Vitals:   05/24/23 1225 05/24/23 1941 05/25/23 0446 05/25/23 0500  BP: 123/63 (!) 114/57 (!) 145/60   Pulse: 69 77 61   Resp: 17     Temp: 98 F (36.7 C) 98.7 F (37.1 C) 98.7 F (37.1 C)   TempSrc: Oral Oral Oral   SpO2: 91% 90% 90%   Weight:    82.6 kg  Height:        General: Pt is alert, awake, not in acute distress Cardiovascular: RRR, S1/S2 +, no rubs, no gallops Respiratory: CTA bilaterally, no wheezing, no rhonchi Abdominal: Soft, NT, ND, bowel sounds + Extremities: no edema, no cyanosis    The results of significant diagnostics from this hospitalization (including imaging, microbiology, ancillary and laboratory) are listed  below for reference.     Microbiology: Recent Results (from the past 240 hour(s))  Urine Culture (for pregnant, neutropenic or urologic patients or patients with an indwelling urinary catheter)     Status: Abnormal   Collection Time: 05/20/23  3:47 AM   Specimen: Urine, Clean Catch  Result Value Ref Range Status   Specimen Description   Final    URINE, CLEAN CATCH Performed at [facility], [address], [place], [place] [address]    Special Requests   Final    NONE Performed at [facility], [address], [place], [place] [address]    Culture >=100,000 COLONIES/mL PSEUDOMONAS AERUGINOSA (A)  Final   Report Status 05/23/2023 FINAL  Final   Organism ID, Bacteria PSEUDOMONAS AERUGINOSA (A)  Final      Susceptibility   Pseudomonas aeruginosa - MIC*    CEFTAZIDIME 4  SENSITIVE Sensitive     CIPROFLOXACIN <=0.25 SENSITIVE Sensitive     GENTAMICIN <=1 SENSITIVE Sensitive     IMIPENEM 2 SENSITIVE Sensitive     PIP/TAZO 8 SENSITIVE Sensitive     CEFEPIME 2 SENSITIVE Sensitive     * >=100,000 COLONIES/mL PSEUDOMONAS AERUGINOSA  Culture, blood (routine x 2)     Status: None   Collection Time: 05/20/23  4:44 PM   Specimen: BLOOD  Result Value Ref Range Status   Specimen Description   Final    BLOOD BLOOD LEFT FOREARM Performed at [facility], [address], [place], [place] [address]    Special Requests   Final    BOTTLES DRAWN AEROBIC AND ANAEROBIC Blood Culture adequate volume Performed at [facility], [address] [place], [place] [address]    Culture   Final    NO GROWTH 5 DAYS Performed at [facility], [address], [place], [place] [address]    Report Status 05/25/2023 FINAL  Final  Culture, blood (routine x 2)     Status: None   Collection Time: 05/20/23  6:25 PM   Specimen: BLOOD  Result Value Ref Range Status   Specimen Description BLOOD SITE NOT SPECIFIED  Final   Special Requests   Final    BOTTLES DRAWN AEROBIC AND ANAEROBIC Blood Culture results may not be optimal due to an excessive volume of blood received in culture bottles   Culture   Final    NO GROWTH 5 DAYS Performed at [facility], [address], [place], [place] [address]    Report Status 05/25/2023 FINAL  Final  Resp panel by RT-PCR (RSV, Flu A&B, Covid) Peripheral     Status: None   Collection Time: 05/20/23  6:25 PM   Specimen: Peripheral; Nasal Swab  Result Value Ref Range Status   SARS Coronavirus 2 by RT PCR NEGATIVE NEGATIVE Final    Comment: (NOTE) SARS-CoV-2 target nucleic acids are NOT DETECTED.  The SARS-CoV-2 RNA is generally detectable in upper respiratory specimens during the acute phase of infection. The lowest concentration of SARS-CoV-2 viral copies this assay can detect is 138 copies/mL. A negative  result does not preclude SARS-Cov-2 infection and should not be used as the sole basis for treatment or other patient management decisions. A negative result may occur with  improper specimen collection/handling, submission of specimen other than nasopharyngeal swab, presence of viral mutation(s) within the areas targeted by this assay, and inadequate number of viral copies(<138 copies/mL). A negative result must be combined with clinical observations, patient  history, and epidemiological information. The expected result is Negative.  Fact Sheet for Patients:  BloggerCourse.com  Fact Sheet for Healthcare Providers:  SeriousBroker.it  This test is no t yet approved or cleared by the [place] FDA and  has been authorized for detection and/or diagnosis of SARS-CoV-2 by FDA under an Emergency Use Authorization (EUA). This EUA will remain  in effect (meaning this test can be used) for the duration of the COVID-19 declaration under Section 564(b)(1) of the Act, 21 U.S.C.section 360bbb-3(b)(1), unless the authorization is terminated  or revoked sooner.       Influenza A by PCR NEGATIVE NEGATIVE Final   Influenza B by PCR NEGATIVE NEGATIVE Final    Comment: (NOTE) The Xpert Xpress SARS-CoV-2/FLU/RSV plus assay is intended as an aid in the diagnosis of influenza from Nasopharyngeal swab specimens and should not be used as a sole basis for treatment. Nasal washings and aspirates are unacceptable for Xpert Xpress SARS-CoV-2/FLU/RSV testing.  Fact Sheet for Patients: BloggerCourse.com  Fact Sheet for Healthcare Providers: SeriousBroker.it  This test is not yet approved or cleared by the [place] FDA and has been authorized for detection and/or diagnosis of SARS-CoV-2 by FDA under an Emergency Use Authorization (EUA). This EUA will remain in effect (meaning this test can be used)  for the duration of the COVID-19 declaration under Section 564(b)(1) of the Act, 21 U.S.C. section 360bbb-3(b)(1), unless the authorization is terminated or revoked.     Resp Syncytial Virus by PCR NEGATIVE NEGATIVE Final    Comment: (NOTE) Fact Sheet for Patients: BloggerCourse.com  Fact Sheet for Healthcare Providers: SeriousBroker.it  This test is not yet approved or cleared by the [place] FDA and has been authorized for detection and/or diagnosis of SARS-CoV-2 by FDA under an Emergency Use Authorization (EUA). This EUA will remain in effect (meaning this test can be used) for the duration of the COVID-19 declaration under Section 564(b)(1) of the Act, 21 U.S.C. section 360bbb-3(b)(1), unless the authorization is terminated or revoked.  Performed at [facility], [address], [place], [place] [address]   Respiratory (~20 pathogens) panel by PCR     Status: None   Collection Time: 05/21/23  4:32 PM   Specimen: Nasopharyngeal Swab; Respiratory  Result Value Ref Range Status   Adenovirus NOT DETECTED NOT DETECTED Final   Coronavirus 229E NOT DETECTED NOT DETECTED Final    Comment: (NOTE) The Coronavirus on the Respiratory Panel, DOES NOT test for the novel  Coronavirus (2019 nCoV)    Coronavirus HKU1 NOT DETECTED NOT DETECTED Final   Coronavirus NL63 NOT DETECTED NOT DETECTED Final   Coronavirus OC43 NOT DETECTED NOT DETECTED Final   Metapneumovirus NOT DETECTED NOT DETECTED Final   Rhinovirus / Enterovirus NOT DETECTED NOT DETECTED Final   Influenza A NOT DETECTED NOT DETECTED Final   Influenza B NOT DETECTED NOT DETECTED Final   Parainfluenza Virus 1 NOT DETECTED NOT DETECTED Final   Parainfluenza Virus 2 NOT DETECTED NOT DETECTED Final   Parainfluenza Virus 3 NOT DETECTED NOT DETECTED Final   Parainfluenza Virus 4 NOT DETECTED NOT DETECTED Final   Respiratory Syncytial Virus NOT DETECTED NOT  DETECTED Final   Bordetella pertussis NOT DETECTED NOT DETECTED Final   Bordetella Parapertussis NOT DETECTED NOT DETECTED Final   Chlamydophila pneumoniae NOT DETECTED NOT DETECTED Final   Mycoplasma pneumoniae NOT DETECTED NOT DETECTED Final    Comment: Performed at [facility], [address] [place], [place] [address]  Labs: BNP (last 3 results) Recent Labs    05/05/23 1744  BNP 116.7*   Basic Metabolic Panel: Recent Labs  Lab 05/20/23 1643 05/20/23 2320 05/22/23 0420 05/23/23 0413  NA 131* 131* 137 134*  K 3.9 3.7 4.0 3.7  CL 97* 98 108 101  CO2 22 23 23 25   GLUCOSE 107* 120* 93 97  BUN 15 14 15 21   CREATININE 0.96 1.00 0.92 0.91  CALCIUM 9.4 8.9 9.2 8.9  MG  --  1.9 2.3 2.1  PHOS  --   --  2.3* 2.4*   Liver Function Tests: Recent Labs  Lab 05/20/23 1643 05/20/23 2320 05/22/23 0420 05/23/23 0413  AST 45* 35 29  --   ALT 40 35 35  --   ALKPHOS 91 79 66  --   BILITOT 0.9 0.9 0.7  --   PROT 6.7 6.1* 5.7*  --   ALBUMIN 3.9 3.4* 3.0* 2.9*   Recent Labs  Lab 05/20/23 1643  LIPASE 26   Recent Labs  Lab 05/20/23 1825  AMMONIA 26   CBC: Recent Labs  Lab 05/20/23 1643 05/20/23 2320 05/22/23 0420 05/23/23 0413  WBC 8.9 9.2 4.3 2.4*  NEUTROABS 6.3 6.4  --   --   HGB 11.0* 9.5* 9.5* 8.9*  HCT 33.6* 29.5* 29.8* 28.0*  MCV 113.9* 113.0* 116.0* 115.7*  PLT 40* 33* 32* 35*   Cardiac Enzymes: Recent Labs  Lab 05/22/23 0420  CKTOTAL 28*   BNP: Invalid input(s): "POCBNP" CBG: No results for input(s): "GLUCAP" in the last 168 hours. D-Dimer No results for input(s): "DDIMER" in the last 72 hours. Hgb A1c No results for input(s): "HGBA1C" in the last 72 hours. Lipid Profile No results for input(s): "CHOL", "HDL", "LDLCALC", "TRIG", "CHOLHDL", "LDLDIRECT" in the last 72 hours. Thyroid function studies No results for input(s): "TSH", "T4TOTAL", "T3FREE", "THYROIDAB" in the last 72 hours.  Invalid input(s): "FREET3" Anemia work up No  results for input(s): "VITAMINB12", "FOLATE", "FERRITIN", "TIBC", "IRON", "RETICCTPCT" in the last 72 hours. Urinalysis    Component Value Date/Time   COLORURINE YELLOW 05/20/2023 1648   APPEARANCEUR HAZY (A) 05/20/2023 1648   APPEARANCEUR Clear 12/22/2015 1614   LABSPEC 1.013 05/20/2023 1648   PHURINE 5.0 05/20/2023 1648   GLUCOSEU NEGATIVE 05/20/2023 1648   GLUCOSEU NEGATIVE 09/06/2017 1040   HGBUR MODERATE (A) 05/20/2023 1648   BILIRUBINUR NEGATIVE 05/20/2023 1648   BILIRUBINUR Negative 12/22/2015 1614   KETONESUR NEGATIVE 05/20/2023 1648   PROTEINUR 30 (A) 05/20/2023 1648   UROBILINOGEN 0.2 09/06/2017 1040   NITRITE NEGATIVE 05/20/2023 1648   LEUKOCYTESUR LARGE (A) 05/20/2023 1648   Sepsis Labs Recent Labs  Lab 05/20/23 1643 05/20/23 2320 05/22/23 0420 05/23/23 0413  WBC 8.9 9.2 4.3 2.4*   Microbiology Recent Results (from the past 240 hour(s))  Urine Culture (for pregnant, neutropenic or urologic patients or patients with an indwelling urinary catheter)     Status: Abnormal   Collection Time: 05/20/23  3:47 AM   Specimen: Urine, Clean Catch  Result Value Ref Range Status   Specimen Description   Final    URINE, CLEAN CATCH Performed at [facility], [address] [place], [place] [address]    Special Requests   Final    NONE Performed at [facility], [address] [place], [place] [address]    Culture >=100,000 COLONIES/mL PSEUDOMONAS AERUGINOSA (A)  Final   Report Status 05/23/2023 FINAL  Final   Organism ID, Bacteria PSEUDOMONAS AERUGINOSA (A)  Final      Susceptibility   Pseudomonas aeruginosa - MIC*    CEFTAZIDIME 4 SENSITIVE Sensitive     CIPROFLOXACIN <=0.25 SENSITIVE Sensitive     GENTAMICIN <=1 SENSITIVE Sensitive     IMIPENEM 2 SENSITIVE Sensitive     PIP/TAZO 8 SENSITIVE Sensitive     CEFEPIME 2 SENSITIVE Sensitive     * >=100,000 COLONIES/mL PSEUDOMONAS AERUGINOSA  Culture, blood (routine x 2)      Status: None   Collection Time: 05/20/23  4:44 PM   Specimen: BLOOD  Result Value Ref Range Status   Specimen Description   Final    BLOOD BLOOD LEFT FOREARM Performed at [facility], [address], [place], [place] [address]    Special Requests   Final    BOTTLES DRAWN AEROBIC AND ANAEROBIC Blood Culture adequate volume Performed at [facility], [address] [place], [place] [address]    Culture   Final    NO GROWTH 5 DAYS Performed at [facility], [address] [place], [place] [address]    Report Status 05/25/2023 FINAL  Final  Culture, blood (routine x 2)     Status: None   Collection Time: 05/20/23  6:25 PM   Specimen: BLOOD  Result Value Ref Range Status   Specimen Description BLOOD SITE NOT SPECIFIED  Final   Special Requests   Final    BOTTLES DRAWN AEROBIC AND ANAEROBIC Blood Culture results may not be optimal due to an excessive volume of blood received in culture bottles   Culture   Final    NO GROWTH 5 DAYS Performed at [facility], [address], [place], [place] [address]    Report Status 05/25/2023 FINAL  Final  Resp panel by RT-PCR (RSV, Flu A&B, Covid) Peripheral     Status: None   Collection Time: 05/20/23  6:25 PM   Specimen: Peripheral; Nasal Swab  Result Value Ref Range Status   SARS Coronavirus 2 by RT PCR NEGATIVE NEGATIVE Final    Comment: (NOTE) SARS-CoV-2 target nucleic acids are NOT DETECTED.  The SARS-CoV-2 RNA is generally detectable in upper respiratory specimens during the acute phase of infection. The lowest concentration of SARS-CoV-2 viral copies this assay can detect is 138 copies/mL. A negative result does not preclude SARS-Cov-2 infection and should not be used as the sole basis for treatment or other patient management decisions. A negative result may occur with  improper specimen collection/handling, submission of specimen other than nasopharyngeal swab, presence of viral mutation(s)  within the areas targeted by this assay, and inadequate number of viral copies(<138 copies/mL). A negative result must be combined with clinical observations, patient history, and epidemiological information. The expected result is Negative.  Fact Sheet for Patients:  BloggerCourse.com  Fact Sheet for Healthcare Providers:  SeriousBroker.it  This test is no t yet approved or cleared by the [place] FDA and  has been authorized for detection and/or diagnosis of SARS-CoV-2 by FDA under an Emergency Use Authorization (EUA). This EUA will remain  in effect (meaning this test can be used) for the duration of the COVID-19 declaration under Section 564(b)(1) of the Act, 21 U.S.C.section 360bbb-3(b)(1), unless the authorization is terminated  or revoked sooner.       Influenza A by PCR NEGATIVE NEGATIVE Final   Influenza B by PCR NEGATIVE NEGATIVE Final    Comment: (NOTE) The Xpert Xpress SARS-CoV-2/FLU/RSV plus assay is intended as an aid in the  diagnosis of influenza from Nasopharyngeal swab specimens and should not be used as a sole basis for treatment. Nasal washings and aspirates are unacceptable for Xpert Xpress SARS-CoV-2/FLU/RSV testing.  Fact Sheet for Patients: BloggerCourse.com  Fact Sheet for Healthcare Providers: SeriousBroker.it  This test is not yet approved or cleared by the [place] FDA and has been authorized for detection and/or diagnosis of SARS-CoV-2 by FDA under an Emergency Use Authorization (EUA). This EUA will remain in effect (meaning this test can be used) for the duration of the COVID-19 declaration under Section 564(b)(1) of the Act, 21 U.S.C. section 360bbb-3(b)(1), unless the authorization is terminated or revoked.     Resp Syncytial Virus by PCR NEGATIVE NEGATIVE Final    Comment: (NOTE) Fact Sheet for  Patients: BloggerCourse.com  Fact Sheet for Healthcare Providers: SeriousBroker.it  This test is not yet approved or cleared by the [place] FDA and has been authorized for detection and/or diagnosis of SARS-CoV-2 by FDA under an Emergency Use Authorization (EUA). This EUA will remain in effect (meaning this test can be used) for the duration of the COVID-19 declaration under Section 564(b)(1) of the Act, 21 U.S.C. section 360bbb-3(b)(1), unless the authorization is terminated or revoked.  Performed at [facility], [address] [place], [place] [address]   Respiratory (~20 pathogens) panel by PCR     Status: None   Collection Time: 05/21/23  4:32 PM   Specimen: Nasopharyngeal Swab; Respiratory  Result Value Ref Range Status   Adenovirus NOT DETECTED NOT DETECTED Final   Coronavirus 229E NOT DETECTED NOT DETECTED Final    Comment: (NOTE) The Coronavirus on the Respiratory Panel, DOES NOT test for the novel  Coronavirus (2019 nCoV)    Coronavirus HKU1 NOT DETECTED NOT DETECTED Final   Coronavirus NL63 NOT DETECTED NOT DETECTED Final   Coronavirus OC43 NOT DETECTED NOT DETECTED Final   Metapneumovirus NOT DETECTED NOT DETECTED Final   Rhinovirus / Enterovirus NOT DETECTED NOT DETECTED Final   Influenza A NOT DETECTED NOT DETECTED Final   Influenza B NOT DETECTED NOT DETECTED Final   Parainfluenza Virus 1 NOT DETECTED NOT DETECTED Final   Parainfluenza Virus 2 NOT DETECTED NOT DETECTED Final   Parainfluenza Virus 3 NOT DETECTED NOT DETECTED Final   Parainfluenza Virus 4 NOT DETECTED NOT DETECTED Final   Respiratory Syncytial Virus NOT DETECTED NOT DETECTED Final   Bordetella pertussis NOT DETECTED NOT DETECTED Final   Bordetella Parapertussis NOT DETECTED NOT DETECTED Final   Chlamydophila pneumoniae NOT DETECTED NOT DETECTED Final   Mycoplasma pneumoniae NOT DETECTED NOT DETECTED Final    Comment:  Performed at [facility], [address], [place], [place] [address]     Time coordinating discharge: Over 35 minutes  SIGNED:   Marinda Elk, MD  [facility] 05/25/2023, 9:40 AM Pager   If 7PM-7AM, please contact night-coverage [email or web address] Password [email or web address]

## 2023-05-25 NOTE — Consult Note (Signed)
   [facility]  [facility] Inpatient Consult   05/25/2023  HABIB KIMMERLY 07/30/1951 [id number]  Location:   RN Hospital Liaison screened the patient remotely at [facility].  Insurance: Medicare ACO [employer]   The patient was screened for 30 day readmission hospitalization with noted medium risk score for unplanned readmission risk with 2 IP/2 ED in 6 months.  The patient was assessed for potential [facility]) Care Management service needs for post hospital transition for care coordination. Review of patient's electronic medical record reveals patient is for SNF at [facility]. Plan: [facility] Liaison will continue to follow progress and disposition to asess for post hospital community care coordination/management needs.  Referral request for community care coordination: patient's community based needs are to be met at the SNF level and with plans to return to [facility]   [facility] Care Management/Population Health does not replace or interfere with any arrangements made by the Inpatient Transition of Care team.   For questions contact:   Charlesetta Shanks, RN, BSN, CCM [place]  [facility] Liaison Direct Dial: [phone] or secure chat Website: [email or web address]

## 2023-05-25 NOTE — TOC Transition Note (Signed)
Transition of Care [facility]) - CM/SW Discharge Note   Patient Details  Name: Andre [name] MRN: [id number] Date of Birth: Feb 21, 1951  Transition of Care [facility]) CM/SW Contact:  Larrie Kass, LCSW Phone Number: 05/25/2023, 9:50 AM   Clinical Narrative:     Pt to be discharged to [facility] for short-term rehab. Patient Room assignment 153, call report [phone]. CSW spoke with pt's wife who agreed with the plan. PTAR called, TOC sign off.   Final next level of care: Skilled Nursing Facility Barriers to Discharge: No Barriers Identified   Patient Goals and CMS Choice CMS Medicare.gov Compare Post Acute Care list provided to:: Patient Choice offered to / list presented to : Patient  Discharge Placement                Patient chooses bed at: [facility] Patient to be transferred to facility by: ems   Patient and family notified of of transfer: 05/25/23  Discharge Plan and Services Additional resources added to the After Visit Summary for   In-house Referral: Clinical Social Work                                   Social Determinants of Health (SDOH) Interventions SDOH Screenings   Food Insecurity: No Food Insecurity (05/21/2023)  Housing: Low Risk  (05/21/2023)  Transportation Needs: No Transportation Needs (05/21/2023)  Utilities: Not At Risk (05/21/2023)  Depression (PHQ2-9): Low Risk  (06/08/2022)  Tobacco Use: Medium Risk (05/20/2023)     Readmission Risk Interventions     No data to display

## 2023-05-28 ENCOUNTER — Non-Acute Institutional Stay (SKILLED_NURSING_FACILITY): Payer: Self-pay | Admitting: Internal Medicine

## 2023-05-28 ENCOUNTER — Encounter: Payer: Self-pay | Admitting: Internal Medicine

## 2023-05-28 DIAGNOSIS — D61818 Other pancytopenia: Secondary | ICD-10-CM

## 2023-05-28 DIAGNOSIS — D696 Thrombocytopenia, unspecified: Secondary | ICD-10-CM

## 2023-05-28 DIAGNOSIS — G620 Drug-induced polyneuropathy: Secondary | ICD-10-CM

## 2023-05-28 DIAGNOSIS — N3000 Acute cystitis without hematuria: Secondary | ICD-10-CM

## 2023-05-28 DIAGNOSIS — I679 Cerebrovascular disease, unspecified: Secondary | ICD-10-CM

## 2023-05-28 DIAGNOSIS — D500 Iron deficiency anemia secondary to blood loss (chronic): Secondary | ICD-10-CM | POA: Diagnosis not present

## 2023-05-28 DIAGNOSIS — T451X5A Adverse effect of antineoplastic and immunosuppressive drugs, initial encounter: Secondary | ICD-10-CM

## 2023-05-28 DIAGNOSIS — R413 Other amnesia: Secondary | ICD-10-CM

## 2023-05-28 NOTE — Progress Notes (Signed)
Provider:   Location:  Medical illustrator of Service:  SNF ([id number])  PCP: [name], MD Patient Care Team: [name], MD as PCP - General (Internal Medicine) Shirlean Schlein, MD as Consulting Physician (Neurology) Eddie Candle, MD as Consulting Physician (Internal Medicine) York Spaniel, MD (Inactive) as Consulting Physician (Neurology) Sharlett Iles, DDS as Referring Physician (Oral Surgery) Ladene Artist, MD as Consulting Physician (Oncology) Fletcher Anon, NP as Nurse Practitioner (Nurse Practitioner)  Extended Emergency Contact Information Primary Emergency Contact: Formosa,[name] Address: [address]          [place], [place] [address] [name] of [place] Home Phone: [phone] Relation: Spouse  Code Status: DNR Goals of Care: Advanced Directive information    05/21/2023    4:00 AM  Advanced Directives  Does Patient Have a Medical Advance Directive? Yes  Type of Advance Directive Out of facility DNR (pink MOST or yellow form)  Does patient want to make changes to medical advance directive? No - Patient declined      Chief Complaint  Patient presents with   Readmit To SNF    HPI: Patient is a 72 y.o. male seen today for Readmission to SNF ReAdmitted in Rehab in [place]   Admitted to hospital from 09/01-09/06 for Change of Mental status due to UTI With Pseudomonas  Was also Admitted from 08/17-08/23 for Acute Hypoxic Respiratory failure and Pseudomonas Bacteremia  Patient has h/o Multiple Myeloma diagnosed in 2012 Recently Post CAR T therapy per DR Marissa Calamity on 01/24  In Clinical Remission On IVIG  MCI due to CART and Chronic Microvascular changes On Ritalin Recurent falls due to Unstable Gait Anemia with Occult positive stools Pancytopenia due to above  Send back to the hospital with High grade fever abd Katherina Mires mental status CT angio of the chest was negative for PE.  COVID-19 and influenza  was negative.  He grew Pseudomonas in his urine He is now discharged on oral Cipro. Patient mental status seems to be at baseline now But he continue sto have low awarenence and keeps on falling Has had one fall since he has been back teith no injuries   Past Medical History:  Diagnosis Date   Allergy    Anemia    Arthritis    Asthma    no treatment x 20 years   Depression    Double vision    occurs at times    Duodenal ulcer    GERD (gastroesophageal reflux disease)    Hemorrhage of rectum and anus 09/15/2013   Hyperlipidemia    Hypertension    Hypothyroidism    Leukopenia due to antineoplastic chemotherapy (HCC) 02/22/2017   Multiple myeloma 07/04/2011   Thyroid disease    TIA (transient ischemic attack) 03/27/2012   Transient diplopia 03/27/2012   Transient global amnesia 06/07/2015   Past Surgical History:  Procedure Laterality Date   BONE MARROW TRANSPLANT  2011   for MM   CARDIOLITE STUDY  11/25/2003   NORMAL   TONSILLECTOMY      reports that he quit smoking about 54 years ago. His smoking use included cigarettes. He started smoking about 57 years ago. He has never used smokeless tobacco. He reports that he does not drink alcohol and does not use drugs. Social History   Socioeconomic History   Marital status: Married    Spouse name: Not on file   Number of children: 2   Years of education: 75  Highest education level: Not on file  Occupational History   Not on file  Tobacco Use   Smoking status: Former    Current packs/day: 0.00    Types: Cigarettes    Start date: 03/27/1966    Quit date: 03/27/1969    Years since quitting: 54.2   Smokeless tobacco: Never  Substance and Sexual Activity   Alcohol use: No   Drug use: No   Sexual activity: Not Currently  Other Topics Concern   Not on file  Social History Narrative   Right handed   [employer] fan   Drinks caffeine   [employer]   [employer] 8 years    Social Determinants of Health   Financial Resource  Strain: Not on file  Food Insecurity: No Food Insecurity (05/21/2023)   Hunger Vital Sign    Worried About Running Out of Food in the Last Year: Never true    Ran Out of Food in the Last Year: Never true  Transportation Needs: No Transportation Needs (05/21/2023)   PRAPARE - Administrator, Civil Service (Medical): No    Lack of Transportation (Non-Medical): No  Physical Activity: Not on file  Stress: Not on file  Social Connections: Not on file  Intimate Partner Violence: Not At Risk (05/21/2023)   Humiliation, Afraid, Rape, and Kick questionnaire    Fear of Current or Ex-Partner: No    Emotionally Abused: No    Physically Abused: No    Sexually Abused: No    Functional Status Survey:    Family History  Problem Relation Age of Onset   Throat cancer Mother    Hypertension Father    Stroke Father    Asthma Father    Diabetes Father    Pancreatic cancer Brother     Health Maintenance  Topic Date Due   Medicare Annual Wellness (AWV)  Never done   Pneumonia Vaccine 34+ Years old (3 of 3 - PPSV23 or PCV20) 06/16/2018   INFLUENZA VACCINE  04/19/2023   COVID-19 Vaccine (6 - 2023-24 season) 05/20/2023   Colonoscopy  09/24/2023   Zoster Vaccines- Shingrix (1 of 2) 02/18/2024 (Originally 06/06/1970)   DTaP/Tdap/Td (5 - Tdap) 03/06/2028   HPV VACCINES  Aged Out   Hepatitis C Screening  Discontinued    Allergies  Allergen Reactions   Atorvastatin Other (See Comments)   Rosuvastatin     Other reaction(s): Liver Disorder   Crestor [Rosuvastatin Calcium]     ADVERSE EFFECTS ON LIVER   Lipitor [Atorvastatin Calcium]    Septra [Sulfamethoxazole-Trimethoprim] Rash    Outpatient Encounter Medications as of 05/28/2023  Medication Sig   acetaminophen (TYLENOL) 650 MG CR tablet Take 650 mg by mouth every 6 (six) hours as needed for pain.   calcium carbonate (TUMS EX) 750 MG chewable tablet Chew 2 tablets by mouth 3 (three) times daily as needed for heartburn.    calcium-vitamin D (OSCAL WITH D) 500-200 MG-UNIT tablet Take 1 tablet by mouth daily.   Cholecalciferol (VITAMIN D3) 25 MCG (1000 UT) capsule Take 1,000 Units by mouth daily.   ciprofloxacin (CIPRO) 500 MG tablet Take 1 tablet (500 mg total) by mouth 2 (two) times daily for 5 days.   cyanocobalamin (VITAMIN B12) 1000 MCG tablet Take 1 tablet (1,000 mcg total) by mouth daily.   dapsone 100 MG tablet Take 100 mg by mouth daily.   docusate sodium (COLACE) 100 MG capsule Take 100 mg by mouth daily as needed for mild constipation or moderate constipation.  escitalopram (LEXAPRO) 10 MG tablet Take 1 tablet (10 mg total) by mouth daily.   ferrous sulfate 325 (65 FE) MG EC tablet Take 325 mg by mouth 2 (two) times daily.   folic acid (FOLVITE) 1 MG tablet Take 1 tablet (1 mg total) by mouth daily.   ipratropium-albuterol (DUONEB) 0.5-2.5 (3) MG/3ML SOLN Take 3 mLs by nebulization 2 (two) times daily as needed for up to 14 days.   levothyroxine (SYNTHROID) 125 MCG tablet Take 125 mcg by mouth daily.   methylphenidate (RITALIN) 5 MG tablet Take 0.5 tablets (2.5 mg total) by mouth daily. Each morning   pantoprazole (PROTONIX) 40 MG tablet Take 1 tablet (40 mg total) by mouth daily.   valACYclovir (VALTREX) 500 MG tablet Take 500 mg by mouth daily.   [DISCONTINUED] prochlorperazine (COMPAZINE) 10 MG tablet Take 1 tablet (10 mg total) by mouth every 6 (six) hours as needed (Nausea or vomiting).   No facility-administered encounter medications on file as of 05/28/2023.    Review of Systems  Constitutional:  Negative for activity change, appetite change and unexpected weight change.  HENT: Negative.    Respiratory:  Negative for cough and shortness of breath.   Cardiovascular:  Negative for leg swelling.  Gastrointestinal:  Negative for constipation.  Genitourinary:  Negative for frequency.  Musculoskeletal:  Positive for gait problem. Negative for arthralgias and myalgias.  Skin: Negative.  Negative for  rash.  Neurological:  Negative for dizziness and weakness.  Psychiatric/Behavioral:  Positive for confusion. Negative for sleep disturbance.   All other systems reviewed and are negative.   Vitals:   05/28/23 1415  BP: (!) 152/78  Pulse: (!) 59  Resp: 14  Temp: 98 F (36.7 C)  Weight: 171 lb (77.6 kg)   Body mass index is 26.78 kg/m. Physical Exam Vitals reviewed.  Constitutional:      Appearance: Normal appearance.  HENT:     Head: Normocephalic.     Nose: Nose normal.     Mouth/Throat:     Mouth: Mucous membranes are moist.     Pharynx: Oropharynx is clear.  Eyes:     Pupils: Pupils are equal, round, and reactive to light.  Cardiovascular:     Rate and Rhythm: Normal rate and regular rhythm.     Pulses: Normal pulses.     Heart sounds: No murmur heard. Pulmonary:     Effort: Pulmonary effort is normal. No respiratory distress.     Breath sounds: Normal breath sounds. No rales.  Abdominal:     General: Abdomen is flat. Bowel sounds are normal.     Palpations: Abdomen is soft.  Musculoskeletal:        General: Swelling present.     Cervical back: Neck supple.  Skin:    General: Skin is warm.  Neurological:     General: No focal deficit present.     Mental Status: He is alert and oriented to person, place, and time.  Psychiatric:        Mood and Affect: Mood normal.        Thought Content: Thought content normal.    Labs reviewed: Basic Metabolic Panel: Recent Labs    05/20/23 2320 05/22/23 0420 05/23/23 0413  NA 131* 137 134*  K 3.7 4.0 3.7  CL 98 108 101  CO2 23 23 25   GLUCOSE 120* 93 97  BUN 14 15 21   CREATININE 1.00 0.92 0.91  CALCIUM 8.9 9.2 8.9  MG 1.9 2.3 2.1  PHOS  --  2.3* 2.4*   Liver Function Tests: Recent Labs    05/20/23 1643 05/20/23 2320 05/22/23 0420 05/23/23 0413  AST 45* 35 29  --   ALT 40 35 35  --   ALKPHOS 91 79 66  --   BILITOT 0.9 0.9 0.7  --   PROT 6.7 6.1* 5.7*  --   ALBUMIN 3.9 3.4* 3.0* 2.9*   Recent Labs     10/29/22 2042 05/05/23 1901 05/20/23 1643  LIPASE 57* 30 26   Recent Labs    05/20/23 1825  AMMONIA 26   CBC: Recent Labs    05/11/23 0348 05/20/23 1643 05/20/23 2320 05/22/23 0420 05/23/23 0413  WBC 2.2* 8.9 9.2 4.3 2.4*  NEUTROABS 1.3* 6.3 6.4  --   --   HGB 9.7* 11.0* 9.5* 9.5* 8.9*  HCT 29.9* 33.6* 29.5* 29.8* 28.0*  MCV 107.9* 113.9* 113.0* 116.0* 115.7*  PLT 27* 40* 33* 32* 35*   Cardiac Enzymes: Recent Labs    05/22/23 0420  CKTOTAL 28*   BNP: Invalid input(s): "POCBNP" Lab Results  Component Value Date   HGBA1C 4.3 (L) 05/06/2023   Lab Results  Component Value Date   TSH 0.173 (L) 05/20/2023   Lab Results  Component Value Date   VITAMINB12 1,163 (H) 05/20/2023   Lab Results  Component Value Date   FOLATE 27.7 05/20/2023   Lab Results  Component Value Date   IRON 110 04/10/2023   TIBC 589 (H) 04/10/2023   FERRITIN 18 (L) 04/10/2023    Imaging and Procedures obtained prior to SNF admission: MR BRAIN WO CONTRAST  Result Date: 05/21/2023 CLINICAL DATA:  Delirium EXAM: MRI HEAD WITHOUT CONTRAST TECHNIQUE: Multiplanar, multiecho pulse sequences of the brain and surrounding structures were obtained without intravenous contrast. COMPARISON:  MRI June 2024. FINDINGS: Brain: No acute infarction, hemorrhage, hydrocephalus, extra-axial collection or mass lesion. Moderate for age T2/FLAIR hyperintensities the white matter, nonspecific but compatible with chronic microvascular ischemic disease. Cerebral atrophy. Remote cerebellar infarct. Vascular: Major arterial flow voids are maintained at the skull base. Skull and upper cervical spine: Heterogeneous bone marrow. Sinuses/Orbits: Clear sinuses.  No acute orbital findings. Other: No mastoid effusions. IMPRESSION: 1. No acute abnormality. 2. Chronic microvascular ischemic disease and remote cerebellar infarct. 3. Heterogeneous bone marrow, compatible with reported history multiple myeloma. Electronically Signed    By: Feliberto Harts M.D.   On: 05/21/2023 15:59   CT Angio Chest PE W and/or Wo Contrast  Result Date: 05/20/2023 CLINICAL DATA:  Pulmonary embolism (PE) suspected, high prob. Found altered. Responsive to pain. EXAM: CT ANGIOGRAPHY CHEST WITH CONTRAST TECHNIQUE: Multidetector CT imaging of the chest was performed using the standard protocol during bolus administration of intravenous contrast. Multiplanar CT image reconstructions and MIPs were obtained to evaluate the vascular anatomy. RADIATION DOSE REDUCTION: This exam was performed according to the departmental dose-optimization program which includes automated exposure control, adjustment of the mA and/or kV according to patient size and/or use of iterative reconstruction technique. CONTRAST:  75mL OMNIPAQUE IOHEXOL 350 MG/ML SOLN COMPARISON:  05/05/2023 FINDINGS: Cardiovascular: No filling defects in the pulmonary arteries to suggest pulmonary emboli. Heart is enlarged. Coronary artery and aortic atherosclerosis. No aneurysm. Mediastinum/Nodes: No mediastinal, hilar, or axillary adenopathy. Trachea and esophagus are unremarkable. Thyroid unremarkable. Large hiatal hernia. Lungs/Pleura: Elevation and eventration of the right hemidiaphragm. Bibasilar atelectasis. No effusions. Upper Abdomen: No acute findings. Small layering gallstone within the gallbladder. Musculoskeletal: Chest wall soft tissues are unremarkable. Multiple old healing bilateral rib fractures. Chronic mild  T12 compression fracture, stable. Chronic patchy lucencies throughout the osseous structures, unchanged. Review of the MIP images confirms the above findings. IMPRESSION: No evidence of pulmonary embolus. Cardiomegaly, coronary artery disease. Bibasilar atelectasis. Large hiatal hernia. Stable chronic patchy lucencies throughout the osseous structures likely related to multiple myeloma. No acute bony abnormality. Aortic Atherosclerosis (ICD10-I70.0). Electronically Signed   By: Charlett Nose M.D.   On: 05/20/2023 20:16   CT Head Wo Contrast  Result Date: 05/20/2023 CLINICAL DATA:  Found altered prior to arrival. Mental status change. Responsive to pain. EXAM: CT HEAD WITHOUT CONTRAST TECHNIQUE: Contiguous axial images were obtained from the base of the skull through the vertex without intravenous contrast. RADIATION DOSE REDUCTION: This exam was performed according to the departmental dose-optimization program which includes automated exposure control, adjustment of the mA and/or kV according to patient size and/or use of iterative reconstruction technique. COMPARISON:  CT head 04/20/2023 FINDINGS: Brain: No intracranial hemorrhage, mass effect, or evidence of acute infarct. No hydrocephalus. No extra-axial fluid collection. Age-commensurate cerebral atrophy and ill-defined hypoattenuation within the cerebral white matter consistent with chronic small vessel ischemic disease. Chronic left cerebellar infarct. Vascular: No hyperdense vessel. Intracranial arterial calcification. Skull: No fracture. Multiple lucencies are present throughout the calvarium, similar to prior Sinuses/Orbits: No acute finding. Paranasal sinuses and mastoid air cells are well aerated. Other: None. IMPRESSION: 1. Unchanged CT head from 04/20/2023. No acute intracranial abnormality. Electronically Signed   By: Minerva Fester M.D.   On: 05/20/2023 17:32   DG Chest Portable 1 View  Result Date: 05/20/2023 CLINICAL DATA:  Hypoxia. Concern for pneumonia. Earlier today was at baseline but found altered prior to arrival. EXAM: PORTABLE CHEST 1 VIEW COMPARISON:  Chest radiographs 05/06/2023, 05/05/2023, 01/11/2023; CT chest, abdomen, and pelvis 05/05/2023 FINDINGS: There is again moderate elevation of the right hemidiaphragm. Cardiac silhouette is at the upper limits of normal size for AP technique. Retrocardiac opacity is seen corresponding to the known large hiatal hernia better seen on prior CT. Mild bibasilar linear  subsegmental atelectasis is similar to prior. No pneumothorax. Old healed posterior right rib fractures. IMPRESSION: 1. Mild bibasilar linear subsegmental atelectasis is similar to prior. 2. Large hiatal hernia. Electronically Signed   By: Neita Garnet M.D.   On: 05/20/2023 17:32    Assessment/Plan 1. Acute cystitis without hematuria Urology Consult per Wife request His CT scan of Abdomen in 05/05/23 which showed normal Renal Structure and normal Prostate Will continue Cipro for total of 15 days of Antibiotics 2. Pancytopenia (HCC) Bone marrow Biopsy did not show any recurrence of Multiple Mylemo Follows with Oncology  3. Iron deficiency anemia due to chronic blood loss On Iron Not candidate for further work up right now due to Low Platelets  4. Neuropathy due to chemotherapeutic drug [facility]) With recurrent falls due to his cognition and Poor Awareness Working with therapy  5. Multiple myeloma with Clinical remission On IVIG Per Oncology On Dapsone and Valtrex for Prophylaxis  6. Memory problem Combination of Vascular disease and CART  7Acquired hypothyroidism TSH was low in Dr Fredderick Erb office Synthroid has been decreased to 125 mcg Repeat TSH was still low in Hospital  Will repeat in 4 weeks and change dose accordingly 8 Depression On Lexapro 9 Dysphagia Mech Soft diet 10 LE edema Low Albumin Will continue to monitor  Family/ staff Communication:   Labs/tests ordered:

## 2023-05-29 ENCOUNTER — Encounter (HOSPITAL_COMMUNITY): Payer: Self-pay | Admitting: Oncology

## 2023-06-06 ENCOUNTER — Inpatient Hospital Stay: Payer: Medicare Other

## 2023-06-06 ENCOUNTER — Inpatient Hospital Stay: Payer: Medicare Other | Attending: Oncology

## 2023-06-06 ENCOUNTER — Encounter: Payer: Self-pay | Admitting: *Deleted

## 2023-06-06 ENCOUNTER — Inpatient Hospital Stay (HOSPITAL_BASED_OUTPATIENT_CLINIC_OR_DEPARTMENT_OTHER): Payer: Medicare Other | Admitting: Oncology

## 2023-06-06 VITALS — BP 98/59 | HR 94 | Temp 98.2°F | Resp 18 | Ht 67.0 in | Wt 179.0 lb

## 2023-06-06 VITALS — BP 130/64 | HR 64 | Temp 98.1°F | Resp 18

## 2023-06-06 DIAGNOSIS — D649 Anemia, unspecified: Secondary | ICD-10-CM

## 2023-06-06 DIAGNOSIS — C9000 Multiple myeloma not having achieved remission: Secondary | ICD-10-CM | POA: Diagnosis present

## 2023-06-06 DIAGNOSIS — D801 Nonfamilial hypogammaglobulinemia: Secondary | ICD-10-CM | POA: Diagnosis present

## 2023-06-06 LAB — CBC WITH DIFFERENTIAL (CANCER CENTER ONLY)
Abs Immature Granulocytes: 0.05 10*3/uL (ref 0.00–0.07)
Basophils Absolute: 0 10*3/uL (ref 0.0–0.1)
Basophils Relative: 1 %
Eosinophils Absolute: 0.1 10*3/uL (ref 0.0–0.5)
Eosinophils Relative: 2 %
HCT: 28.8 % — ABNORMAL LOW (ref 39.0–52.0)
Hemoglobin: 9.5 g/dL — ABNORMAL LOW (ref 13.0–17.0)
Immature Granulocytes: 2 %
Lymphocytes Relative: 14 %
Lymphs Abs: 0.5 10*3/uL — ABNORMAL LOW (ref 0.7–4.0)
MCH: 37.8 pg — ABNORMAL HIGH (ref 26.0–34.0)
MCHC: 33 g/dL (ref 30.0–36.0)
MCV: 114.7 fL — ABNORMAL HIGH (ref 80.0–100.0)
Monocytes Absolute: 0.4 10*3/uL (ref 0.1–1.0)
Monocytes Relative: 13 %
Neutro Abs: 2.4 10*3/uL (ref 1.7–7.7)
Neutrophils Relative %: 68 %
Platelet Count: 25 10*3/uL — ABNORMAL LOW (ref 150–400)
RBC: 2.51 MIL/uL — ABNORMAL LOW (ref 4.22–5.81)
RDW: 18.2 % — ABNORMAL HIGH (ref 11.5–15.5)
WBC Count: 3.4 10*3/uL — ABNORMAL LOW (ref 4.0–10.5)
nRBC: 0 % (ref 0.0–0.2)

## 2023-06-06 LAB — CMP (CANCER CENTER ONLY)
ALT: 22 U/L (ref 0–44)
AST: 27 U/L (ref 15–41)
Albumin: 3.9 g/dL (ref 3.5–5.0)
Alkaline Phosphatase: 80 U/L (ref 38–126)
Anion gap: 12 (ref 5–15)
BUN: 17 mg/dL (ref 8–23)
CO2: 24 mmol/L (ref 22–32)
Calcium: 9.4 mg/dL (ref 8.9–10.3)
Chloride: 102 mmol/L (ref 98–111)
Creatinine: 1 mg/dL (ref 0.61–1.24)
GFR, Estimated: >60 mL/min (ref >60)
Glucose, Bld: 106 mg/dL — ABNORMAL HIGH (ref 70–99)
Potassium: 3.9 mmol/L (ref 3.5–5.1)
Sodium: 138 mmol/L (ref 135–145)
Total Bilirubin: 0.6 mg/dL (ref 0.3–1.2)
Total Protein: 6.7 g/dL (ref 6.5–8.1)

## 2023-06-06 LAB — SAMPLE TO BLOOD BANK

## 2023-06-06 LAB — IRON AND TIBC
Iron: 199 ug/dL — ABNORMAL HIGH (ref 45–182)
Saturation Ratios: 43 % — ABNORMAL HIGH (ref 17.9–39.5)
TIBC: 461 ug/dL — ABNORMAL HIGH (ref 250–450)
UIBC: 262 ug/dL

## 2023-06-06 LAB — FERRITIN: Ferritin: 175 ng/mL (ref 24–336)

## 2023-06-06 MED ORDER — IMMUNE GLOBULIN (HUMAN) 20 GM/200ML IV SOLN
400.0000 mg/kg | Freq: Once | INTRAVENOUS | Status: AC
  Administered 2023-06-06: 30 g via INTRAVENOUS
  Filled 2023-06-06: qty 300

## 2023-06-06 MED ORDER — ACETAMINOPHEN 325 MG PO TABS
650.0000 mg | ORAL_TABLET | Freq: Once | ORAL | Status: AC
  Administered 2023-06-06: 650 mg via ORAL
  Filled 2023-06-06: qty 2

## 2023-06-06 MED ORDER — DIPHENHYDRAMINE HCL 25 MG PO CAPS
25.0000 mg | ORAL_CAPSULE | Freq: Once | ORAL | Status: AC
  Administered 2023-06-06: 25 mg via ORAL
  Filled 2023-06-06: qty 1

## 2023-06-06 NOTE — Progress Notes (Signed)
[facility] OFFICE PROGRESS NOTE   Diagnosis: Multiple myeloma  INTERVAL HISTORY:   Andre [name] returns as scheduled.  He was discharged from hospital 05/25/2023 after admission with a Pseudomonas urinary tract infection.  He completed an outpatient course of ciprofloxacin.  No fever or bleeding.  He remains on the rehabilitation unit at [facility].  He is participating physical therapy.  No specific complaint.  He received IVIG while in the hospital.  Objective:  Vital signs in last 24 hours:  Blood pressure (!) 98/59, pulse 94, temperature 98.2 F (36.8 C), temperature source Skin, resp. rate 18, height 5\' 7"  (1.702 m), weight 179 lb (81.2 kg), SpO2 100%.    HEENT: Mild white coat over the tongue, no buccal thrush Resp: Coarse end inspiratory rhonchi at the posterior base bilaterally, no respiratory distress Cardio: Rate and rhythm GI: No hepatosplenomegaly Vascular: Trace pitting edema to lower leg bilaterally Neuro: Alert and oriented  Lab Results:  Lab Results  Component Value Date   WBC 3.4 (L) 06/06/2023   HGB 9.5 (L) 06/06/2023   HCT 28.8 (L) 06/06/2023   MCV 114.7 (H) 06/06/2023   PLT 25 (L) 06/06/2023   NEUTROABS 2.4 06/06/2023    CMP  Lab Results  Component Value Date   NA 134 (L) 05/23/2023   K 3.7 05/23/2023   CL 101 05/23/2023   CO2 25 05/23/2023   GLUCOSE 97 05/23/2023   BUN 21 05/23/2023   CREATININE 0.91 05/23/2023   CALCIUM 8.9 05/23/2023   PROT 5.7 (L) 05/22/2023   ALBUMIN 2.9 (L) 05/23/2023   AST 29 05/22/2023   ALT 35 05/22/2023   ALKPHOS 66 05/22/2023   BILITOT 0.7 05/22/2023   GFRNONAA >60 05/23/2023   GFRAA >60 06/15/2020      Medications: I have reviewed the patient's current medications.   Assessment/Plan: Multiple myeloma- IgG lambda  bortezomib (subcutaneously), lenalidomide, and dexamethasone, with repeat bone marrow biopsy May of 2012 showing 10% plasmacytosis   (2) high-dose chemotherapy with BCNU and  melphalan at [facility], followed by stem cell rescue July of 2012   (3) on zoledronic acid started December of 2012, initially monthly, currently given every 3 months, most recent dose  12/07/2015   (4) low-dose lenalidomide resumed April 2013, interrupted several times.  Resumed again on 02/19/2013, ata dose of 5 mg daily, 21 days on and 7 days off, later further reduced to 7 days on, 7 days off   (5) CNS symptoms and abnormal brain MRI extensively evaluated by neurology with no definitive diagnosis established   (6) rising M spike noted June 2014 but did not meet criteria for carfilzomib study   (7) on lenalidomide 25 mg daily, 14 days on, 7 days off, starting 04/18/2013, interrupted December 2014 because of rash;             (a) resumed January 2015 at 10 mg/ day at 21 days on/ 7 days off             (b) starting 08/18/2014 decreased to 10 mg/ day 14 days on, 7 days off because of cytopenias             (c) as of February 2016 was on 5 mg daily 7 days on 7 days off             (d) lenalidomide discontinued December 2016 with evidence of disease progression   (8) transient global amnesia 05/29/2015, resolved without intervention   (9) starting PVD 10/25/2015 w ASA 325  thromboprophylaxis, valacyclovir prophylaxis, last dose 12/17/2015             (a) pomalidomide 4 mg/d days 1-14             (b) bortezomib sQ days 2,5,9,12 of each 21 day cycle             (c) dexamethasome 20 mg two days a week             (d) dexamethasone bortezomib and pomalidomide discontinued late December 2018 with poor tolerance   (10) metapneumovirus pneumonia April 2017             (a) completing course of steroids and week of bactrim mid April 2017   (11) status post second autologous transplant at [facility] 02/04/2016(preparatory regimen melphalan 200 mg/m)             (a) received twelve-month vaccinations 03/14/2017 (DPT, Haemophilus, Pneumovax 13, polio)             (b) 14 months injections 05/04/2016 include DTaP,  Hib conjugate, HepB energex B 20 mcg/ml, Prevnar 13             (c) 24 month vaccines due at [facility] June 2019   (12) maintenance therapy started November 2017, consisting of             (a) bortezomib 1.3 mg/M2 every 14 days, first dose 07/27/2016             (b) pomalidomide 1 mg days 1-21 Q28 days, started 07/19/2016             (c) zolendronate monthly started 07/27/2016 (previously Q 3 months) however patient unable to tolerate, and changed back to q3 months in April, 2018             (d) Bortezomib changed to monthly as of June 2018 because of tolerance issues, however discontinued after 09/14/2017 dose because of a rise in his M spike             (e) pomalidomide held after 10/18/2017 in preparation for possible study at [facility] (venetoclax)--never resumed             (f) with numbers actually improved off treatment, resumed every 2-week bortezomib 12/25/2017             (g) changed to every 3-week bortezomib as of 03/17/2019             (h) briefly on weekly treatments times 22 October 2020 due to increase in M spike             (I) maintenance therapy discontinued with evidence of progression   (13) bortezomib/daratumumab/dexamethasone started 12/20/2020.             (a) Decadron dose dropped to 10 mg day of and day following treatment starting May 2022             (b) day 8 bortezomib omitted beginning with the June cycle per patient preference             (C) treatment changed to monthly daratumumab/Velcade/Decadron beginning 06/27/2021-patient decision (14) weekly Velcade/Cytoxan beginning 11/14/2021 (15) changed to Cytoxan/carfilzomib/Decadron 12/19/2021-treatment placed on hold 05/15/2022, carfilzomib 05/24/2022 and 05/31/2022 16.  Leukocyte a pheresis procedure at [facility] 06/15/2022 17.  Treatment resumed with Cytoxan/carfilzomib/Decadron 06/28/2022, last given 09/06/2022 18.  CAR-T 09/26/2022 with Carvykti 19.  CRS and ICANS following CAR-T therapy, status post a Decadron taper  completed 10/08/2022 20.  Presentation to the emergency room  10/30/2022 with failure to thrive and a fall  21.  Pancytopenia secondary to multiple myeloma and CAR-T therapy-improved 22.  Bone marrow biopsy 11/13/2022-mildly hypocellular bone marrow with relative erythroid hyperplasia and decreased megakaryocytes; no plasma cells identified by differential count or CD138 immunohistochemical stain; flow cytometry negative for a clonal plasma cell population.  The bone marrow biopsy 05/10/2023-mildly hypercellular marrow with mild decrease in megakaryocytes, plasma cells not increased, absent iron stores 23.  Cognitive impairment predating CAR-T therapy and worsened following CAR-T therapy 24.  Admission 05/05/2023 with Pseudomonas sepsis CTs chest, abdomen, and pelvis-right lung base atelectasis, no infiltrate 25.  Admission 05/20/2023 with hypoxia, altered mental status, and fever Infectious disease evaluation on hospital admission consistent with a urinary tract infection, urine culture positive for Pseudomonas aeruginosa 26.  Hypogammaglobulinemia secondary to multiple myeloma and CAR-T therapy     Disposition: Andre [name] appears stable.  He appears to have recovered from the admission with urosepsis.  He will call for bleeding or a fever.  He continues monthly IVIG replacement.  He is in clinical remission from myeloma.  He is scheduled to Dr. Dorothea Ogle within the next few weeks.  He will return for an office visit and IVIG in 1 month.  He received an influenza vaccine today.  Thornton Papas, MD  06/06/2023  9:27 AM

## 2023-06-06 NOTE — Progress Notes (Unsigned)
Patient seen by Dr. Thornton Papas today  Vitals are within treatment parameters.  Labs reviewed by Dr. Truett Perna and are within treatment parameters.OK-platelets 25,000  Per physician team, patient is ready for treatment and there are NO modifications to the treatment plan.

## 2023-06-06 NOTE — Patient Instructions (Signed)

## 2023-06-07 ENCOUNTER — Encounter: Payer: Self-pay | Admitting: Adult Health

## 2023-06-07 ENCOUNTER — Non-Acute Institutional Stay (SKILLED_NURSING_FACILITY): Payer: Medicare Other | Admitting: Adult Health

## 2023-06-07 DIAGNOSIS — R531 Weakness: Secondary | ICD-10-CM | POA: Diagnosis not present

## 2023-06-07 DIAGNOSIS — M545 Low back pain, unspecified: Secondary | ICD-10-CM

## 2023-06-07 DIAGNOSIS — R0902 Hypoxemia: Secondary | ICD-10-CM | POA: Diagnosis not present

## 2023-06-07 DIAGNOSIS — C9000 Multiple myeloma not having achieved remission: Secondary | ICD-10-CM | POA: Diagnosis not present

## 2023-06-07 NOTE — Progress Notes (Signed)
Location:  Medical illustrator of Service:  SNF (31) Provider:   Peggye Ley, ANP [facility] [phone]   [name], MD  Patient Care Team: [name], MD as PCP - General (Internal Medicine) Shirlean Schlein, MD as Consulting Physician (Neurology) Eddie Candle, MD as Consulting Physician (Internal Medicine) York Spaniel, MD (Inactive) as Consulting Physician (Neurology) Sharlett Iles, DDS as Referring Physician (Oral Surgery) Ladene Artist, MD as Consulting Physician (Oncology) Fletcher Anon, NP as Nurse Practitioner (Nurse Practitioner)  Extended Emergency Contact Information Primary Emergency Contact: Holmer,[name] Address: [address]          [place], [place] [address] [name] of [place] Home Phone: [phone] Relation: Spouse  Code Status:  DNR Goals of care: Advanced Directive information    05/21/2023    4:00 AM  Advanced Directives  Does Patient Have a Medical Advance Directive? Yes  Type of Advance Directive Out of facility DNR (pink MOST or yellow form)  Does patient want to make changes to medical advance directive? No - Patient declined     Chief Complaint  Patient presents with   Acute Visit    weakness    HPI:  Pt is a 71 y.o. male seen today for an acute visit for weakness.  Admitted to hospital from 09/01-09/06 for Change of Mental status due to UTI with pseudomonas And also was admitted from 08/17-08/23 for Acute Hypoxic Respiratory failure and Pseudomonas Bacteremia Both times he was discharged on Cipro.   Also PMH: diagnosed with multiple myeloma in 2012. Has received chemo, dexamethasone, and CAR T therapy.   Followed by heme/onc for multiple myeloma and thrombocytopenia. He received an Immunoglobulin infusion, which he does monthly.   Also has cognitive impairment, heme pos stool on Protonix, and unsteady gait.   Nurse reports he had a fall last evening  after attempting to get OOB without help. He did not hit his head. He has a small bruise on his buttock. He is able to walk but is weaker and needs more assistance. Also slightly more confused from baseline. He has a cognitive impairment chronically and has poor safety awareness. He is now on 3 liters of oxygen sats 91%. He was weaned to room air per hospital discharge summary (05/25/23) however since returning to [facility] he has required 2liters at first and then the past few days 3 liters. He denies cough or sob. No fever. No urinary symptoms.     Past Medical History:  Diagnosis Date   Allergy    Anemia    Arthritis    Asthma    no treatment x 20 years   Depression    Double vision    occurs at times    Duodenal ulcer    GERD (gastroesophageal reflux disease)    Hemorrhage of rectum and anus 09/15/2013   Hyperlipidemia    Hypertension    Hypothyroidism    Leukopenia due to antineoplastic chemotherapy (HCC) 02/22/2017   Multiple myeloma 07/04/2011   Thyroid disease    TIA (transient ischemic attack) 03/27/2012   Transient diplopia 03/27/2012   Transient global amnesia 06/07/2015   Past Surgical History:  Procedure Laterality Date   BONE MARROW TRANSPLANT  2011   for MM   CARDIOLITE STUDY  11/25/2003   NORMAL   TONSILLECTOMY      Allergies  Allergen Reactions   Atorvastatin Other (See Comments)   Rosuvastatin     Other reaction(s): Liver  Disorder   Crestor [Rosuvastatin Calcium]     ADVERSE EFFECTS ON LIVER   Lipitor [Atorvastatin Calcium]    Septra [Sulfamethoxazole-Trimethoprim] Rash    Outpatient Encounter Medications as of 06/07/2023  Medication Sig   acetaminophen (TYLENOL) 650 MG CR tablet Take 650 mg by mouth every 6 (six) hours as needed for pain.   calcium carbonate (TUMS EX) 750 MG chewable tablet Chew 2 tablets by mouth 3 (three) times daily as needed for heartburn.   calcium-vitamin D (OSCAL WITH D) 500-200 MG-UNIT tablet Take 1 tablet by mouth daily.    Cholecalciferol (VITAMIN D3) 25 MCG (1000 UT) capsule Take 1,000 Units by mouth daily.   cyanocobalamin (VITAMIN B12) 1000 MCG tablet Take 1 tablet (1,000 mcg total) by mouth daily.   dapsone 100 MG tablet Take 100 mg by mouth daily.   docusate sodium (COLACE) 100 MG capsule Take 100 mg by mouth daily as needed for mild constipation or moderate constipation.   escitalopram (LEXAPRO) 10 MG tablet Take 1 tablet (10 mg total) by mouth daily.   ferrous sulfate 325 (65 FE) MG EC tablet Take 325 mg by mouth 2 (two) times daily.   folic acid (FOLVITE) 1 MG tablet Take 1 tablet (1 mg total) by mouth daily.   levothyroxine (SYNTHROID) 125 MCG tablet Take 125 mcg by mouth daily.   methylphenidate (RITALIN) 5 MG tablet Take 0.5 tablets (2.5 mg total) by mouth daily. Each morning   pantoprazole (PROTONIX) 40 MG tablet Take 1 tablet (40 mg total) by mouth daily.   valACYclovir (VALTREX) 500 MG tablet Take 500 mg by mouth daily.   [DISCONTINUED] ipratropium-albuterol (DUONEB) 0.5-2.5 (3) MG/3ML SOLN Take 3 mLs by nebulization 2 (two) times daily as needed for up to 14 days.   [DISCONTINUED] prochlorperazine (COMPAZINE) 10 MG tablet Take 1 tablet (10 mg total) by mouth every 6 (six) hours as needed (Nausea or vomiting).   No facility-administered encounter medications on file as of 06/07/2023.    Review of Systems  Constitutional:  Positive for activity change. Negative for appetite change, chills, diaphoresis, fatigue, fever and unexpected weight change.  Respiratory:  Negative for cough, shortness of breath, wheezing and stridor.   Cardiovascular:  Positive for leg swelling. Negative for chest pain and palpitations.  Gastrointestinal:  Negative for abdominal distention, abdominal pain, constipation and diarrhea.  Genitourinary:  Negative for difficulty urinating, dysuria, frequency and hematuria.  Musculoskeletal:  Positive for back pain and gait problem. Negative for arthralgias, joint swelling and  myalgias.  Neurological:  Positive for weakness. Negative for dizziness, seizures, syncope, facial asymmetry, speech difficulty and headaches.  Hematological:  Negative for adenopathy. Does not bruise/bleed easily.  Psychiatric/Behavioral:  Positive for confusion. Negative for agitation and behavioral problems.     Immunization History  Administered Date(s) Administered   COVID-19, mRNA, vaccine(Comirnaty)12 years and older 07/05/2022   DTaP / Hep B / IPV 05/04/2017   DTaP / HiB / IPV 03/06/2018   Fluad Quad(high Dose 65+) 06/09/2019, 06/25/2020, 07/25/2021, 05/30/2022   Fluad Trivalent(High Dose 65+) 08/04/2012, 07/03/2014   HIB (PRP-T) 05/04/2017   Hepatitis B, ADULT 03/14/2017, 03/06/2018   IPV 01/28/1998   Influenza, High Dose Seasonal PF 06/16/2018   Influenza,inj,Quad PF,6+ Mos 06/16/2013, 07/03/2014, 06/11/2015, 05/31/2017   Influenza-Unspecified 07/29/2012   PFIZER(Purple Top)SARS-COV-2 Vaccination 10/26/2019, 11/20/2019, 07/22/2020   Pfizer Covid-19 Vaccine Bivalent Booster 53yrs & up 06/27/2021   Pneumococcal Conjugate-13 10/15/2013, 05/04/2017, 03/06/2018   Pneumococcal Polysaccharide-23 06/16/2013   Respiratory Syncytial Virus Vaccine,Recomb Aduvanted(Arexvy) 05/30/2022  Td 02/10/1998, 07/03/2014   Pertinent  Health Maintenance Due  Topic Date Due   Colonoscopy  09/24/2023   INFLUENZA VACCINE  06/21/2023 (Originally 04/19/2023)      07/26/2022   10:22 AM 08/18/2022    9:00 AM 08/30/2022   11:01 AM 09/06/2022   10:00 AM 02/15/2023    1:29 PM  Fall Risk  Falls in the past year?     1  Was there an injury with Fall?     1  Fall Risk Category Calculator     3  (RETIRED) Patient Fall Risk Level Low fall risk Low fall risk Low fall risk Low fall risk   Fall risk Follow up     Falls evaluation completed   Functional Status Survey:    Vitals:   06/07/23 1500  BP: 126/70  Pulse: 98  Resp: 20  Temp: 98.6 F (37 C)  SpO2: 91%   There is no height or weight on  file to calculate BMI. Physical Exam Vitals and nursing note reviewed.  Constitutional:      General: He is not in acute distress.    Appearance: He is not diaphoretic.  HENT:     Head: Normocephalic and atraumatic.     Nose: Nose normal.     Mouth/Throat:     Mouth: Mucous membranes are moist.     Pharynx: Oropharynx is clear.  Eyes:     Extraocular Movements: Extraocular movements intact.     Conjunctiva/sclera: Conjunctivae normal.     Pupils: Pupils are equal, round, and reactive to light.  Neck:     Thyroid: No thyromegaly.     Vascular: No JVD.     Trachea: No tracheal deviation.  Cardiovascular:     Rate and Rhythm: Normal rate and regular rhythm.     Heart sounds: No murmur heard. Pulmonary:     Effort: Pulmonary effort is normal. No respiratory distress.     Breath sounds: Normal breath sounds. No wheezing.  Abdominal:     General: Bowel sounds are normal. There is no distension.     Palpations: Abdomen is soft.     Tenderness: There is no abdominal tenderness. There is no right CVA tenderness or left CVA tenderness.  Musculoskeletal:        General: No swelling, tenderness or deformity.     Comments: BLE edema +1 MAE  Lymphadenopathy:     Cervical: No cervical adenopathy.  Skin:    General: Skin is warm and dry.     Findings: Bruising (sacral area) present.  Neurological:     General: No focal deficit present.     Mental Status: He is alert. Mental status is at baseline.     Cranial Nerves: No cranial nerve deficit.     Motor: Weakness (general) present.     Coordination: Coordination normal.     Gait: Gait abnormal.  Psychiatric:        Mood and Affect: Mood normal.     Labs reviewed: Recent Labs    05/20/23 2320 05/22/23 0420 05/23/23 0413 06/06/23 0849  NA 131* 137 134* 138  K 3.7 4.0 3.7 3.9  CL 98 108 101 102  CO2 23 23 25 24   GLUCOSE 120* 93 97 106*  BUN 14 15 21 17   CREATININE 1.00 0.92 0.91 1.00  CALCIUM 8.9 9.2 8.9 9.4  MG 1.9 2.3  2.1  --   PHOS  --  2.3* 2.4*  --    Recent Labs  05/20/23 2320 05/22/23 0420 05/23/23 0413 06/06/23 0849  AST 35 29  --  27  ALT 35 35  --  22  ALKPHOS 79 66  --  80  BILITOT 0.9 0.7  --  0.6  PROT 6.1* 5.7*  --  6.7  ALBUMIN 3.4* 3.0* 2.9* 3.9   Recent Labs    05/20/23 1643 05/20/23 2320 05/22/23 0420 05/23/23 0413 06/06/23 0849  WBC 8.9 9.2 4.3 2.4* 3.4*  NEUTROABS 6.3 6.4  --   --  2.4  HGB 11.0* 9.5* 9.5* 8.9* 9.5*  HCT 33.6* 29.5* 29.8* 28.0* 28.8*  MCV 113.9* 113.0* 116.0* 115.7* 114.7*  PLT 40* 33* 32* 35* 25*   Lab Results  Component Value Date   TSH 0.173 (L) 05/20/2023   Lab Results  Component Value Date   HGBA1C 4.3 (L) 05/06/2023   Lab Results  Component Value Date   CHOL 178 02/13/2023   HDL 42 02/13/2023   LDLCALC 113 02/13/2023   LDLDIRECT 155.8 11/13/2012   TRIG 167 (A) 02/13/2023   CHOLHDL 4 09/06/2017    Significant Diagnostic Results in last 30 days:  CT HEAD WO CONTRAST ( )  Result Date: 05/24/2023 CLINICAL DATA:  Initial evaluation evaluate initial evaluation for trauma, fall. EXAM: CT HEAD WITHOUT CONTRAST TECHNIQUE: Contiguous axial images were obtained from the base of the skull through the vertex without intravenous contrast. RADIATION DOSE REDUCTION: This exam was performed according to the departmental dose-optimization program which includes automated exposure control, adjustment of the mA and/or kV according to patient size and/or use of iterative reconstruction technique. COMPARISON:  Prior study from 05/21/2023. FINDINGS: Brain: Cerebral volume within normal limits. Moderately advanced chronic microvascular ischemic disease with small remote left cerebellar infarct. No acute intracranial hemorrhage. No acute large vessel territory infarct. No mass lesion or midline shift. No hydrocephalus or extra-axial fluid collection. Vascular: No abnormal hyperdense vessel. Scattered calcified atherosclerosis present at the skull base. Skull:  Scalp soft tissues demonstrate no acute finding. Calvarium intact. Innumerable lucency seen throughout the calvarium, consistent with history of multiple myeloma. Sinuses/Orbits: Globes and orbital soft tissues demonstrate no acute finding. Mild mucosal thickening about the ethmoidal air cells. Paranasal sinuses are otherwise clear. No mastoid effusion. Other: None. IMPRESSION: 1. No acute intracranial abnormality. 2. Moderately advanced chronic microvascular ischemic disease with small remote left cerebellar infarct. 3. Innumerable lucency throughout the calvarium, consistent with history of multiple myeloma. Electronically Signed   By: Rise Mu M.D.   On: 05/24/2023 01:56   MR BRAIN WO CONTRAST  Result Date: 05/21/2023 CLINICAL DATA:  Delirium EXAM: MRI HEAD WITHOUT CONTRAST TECHNIQUE: Multiplanar, multiecho pulse sequences of the brain and surrounding structures were obtained without intravenous contrast. COMPARISON:  MRI June 2024. FINDINGS: Brain: No acute infarction, hemorrhage, hydrocephalus, extra-axial collection or mass lesion. Moderate for age T2/FLAIR hyperintensities the white matter, nonspecific but compatible with chronic microvascular ischemic disease. Cerebral atrophy. Remote cerebellar infarct. Vascular: Major arterial flow voids are maintained at the skull base. Skull and upper cervical spine: Heterogeneous bone marrow. Sinuses/Orbits: Clear sinuses.  No acute orbital findings. Other: No mastoid effusions. IMPRESSION: 1. No acute abnormality. 2. Chronic microvascular ischemic disease and remote cerebellar infarct. 3. Heterogeneous bone marrow, compatible with reported history multiple myeloma. Electronically Signed   By: Feliberto Harts M.D.   On: 05/21/2023 15:59   CT Angio Chest PE W and/or Wo Contrast  Result Date: 05/20/2023 CLINICAL DATA:  Pulmonary embolism (PE) suspected, high prob. Found altered. Responsive to pain. EXAM: CT  ANGIOGRAPHY CHEST WITH CONTRAST TECHNIQUE:  Multidetector CT imaging of the chest was performed using the standard protocol during bolus administration of intravenous contrast. Multiplanar CT image reconstructions and MIPs were obtained to evaluate the vascular anatomy. RADIATION DOSE REDUCTION: This exam was performed according to the departmental dose-optimization program which includes automated exposure control, adjustment of the mA and/or kV according to patient size and/or use of iterative reconstruction technique. CONTRAST:  75mL OMNIPAQUE IOHEXOL 350 MG/ML SOLN COMPARISON:  05/05/2023 FINDINGS: Cardiovascular: No filling defects in the pulmonary arteries to suggest pulmonary emboli. Heart is enlarged. Coronary artery and aortic atherosclerosis. No aneurysm. Mediastinum/Nodes: No mediastinal, hilar, or axillary adenopathy. Trachea and esophagus are unremarkable. Thyroid unremarkable. Large hiatal hernia. Lungs/Pleura: Elevation and eventration of the right hemidiaphragm. Bibasilar atelectasis. No effusions. Upper Abdomen: No acute findings. Small layering gallstone within the gallbladder. Musculoskeletal: Chest wall soft tissues are unremarkable. Multiple old healing bilateral rib fractures. Chronic mild T12 compression fracture, stable. Chronic patchy lucencies throughout the osseous structures, unchanged. Review of the MIP images confirms the above findings. IMPRESSION: No evidence of pulmonary embolus. Cardiomegaly, coronary artery disease. Bibasilar atelectasis. Large hiatal hernia. Stable chronic patchy lucencies throughout the osseous structures likely related to multiple myeloma. No acute bony abnormality. Aortic Atherosclerosis (ICD10-I70.0). Electronically Signed   By: Charlett Nose M.D.   On: 05/20/2023 20:16   CT Head Wo Contrast  Result Date: 05/20/2023 CLINICAL DATA:  Found altered prior to arrival. Mental status change. Responsive to pain. EXAM: CT HEAD WITHOUT CONTRAST TECHNIQUE: Contiguous axial images were obtained from the base of  the skull through the vertex without intravenous contrast. RADIATION DOSE REDUCTION: This exam was performed according to the departmental dose-optimization program which includes automated exposure control, adjustment of the mA and/or kV according to patient size and/or use of iterative reconstruction technique. COMPARISON:  CT head 04/20/2023 FINDINGS: Brain: No intracranial hemorrhage, mass effect, or evidence of acute infarct. No hydrocephalus. No extra-axial fluid collection. Age-commensurate cerebral atrophy and ill-defined hypoattenuation within the cerebral white matter consistent with chronic small vessel ischemic disease. Chronic left cerebellar infarct. Vascular: No hyperdense vessel. Intracranial arterial calcification. Skull: No fracture. Multiple lucencies are present throughout the calvarium, similar to prior Sinuses/Orbits: No acute finding. Paranasal sinuses and mastoid air cells are well aerated. Other: None. IMPRESSION: 1. Unchanged CT head from 04/20/2023. No acute intracranial abnormality. Electronically Signed   By: Minerva Fester M.D.   On: 05/20/2023 17:32   DG Chest Portable 1 View  Result Date: 05/20/2023 CLINICAL DATA:  Hypoxia. Concern for pneumonia. Earlier today was at baseline but found altered prior to arrival. EXAM: PORTABLE CHEST 1 VIEW COMPARISON:  Chest radiographs 05/06/2023, 05/05/2023, 01/11/2023; CT chest, abdomen, and pelvis 05/05/2023 FINDINGS: There is again moderate elevation of the right hemidiaphragm. Cardiac silhouette is at the upper limits of normal size for AP technique. Retrocardiac opacity is seen corresponding to the known large hiatal hernia better seen on prior CT. Mild bibasilar linear subsegmental atelectasis is similar to prior. No pneumothorax. Old healed posterior right rib fractures. IMPRESSION: 1. Mild bibasilar linear subsegmental atelectasis is similar to prior. 2. Large hiatal hernia. Electronically Signed   By: Neita Garnet M.D.   On: 05/20/2023  17:32   CT BONE MARROW BIOPSY & ASPIRATION  Result Date: 05/10/2023 INDICATION: History of multiple myeloma, now with pancytopenia. Please perform CT-guided bone marrow biopsy for tissue diagnostic purposes. EXAM: CT-GUIDED BONE MARROW BIOPSY AND ASPIRATION MEDICATIONS: None ANESTHESIA/SEDATION: Moderate (conscious) sedation was employed during this procedure as administered  by the Interventional Radiology RN. A total of Versed 1 mg and Fentanyl 75 mcg was administered intravenously. Moderate Sedation Time: 10 minutes. [name] level of consciousness and vital signs were monitored continuously by radiology nursing throughout the procedure under my direct supervision. COMPLICATIONS: None immediate. PROCEDURE: Informed consent was obtained from the patient following an explanation of the procedure, risks, benefits and alternatives. The patient understands, agrees and consents for the procedure. All questions were addressed. A time out was performed prior to the initiation of the procedure. The patient was positioned prone and non-contrast localization CT was performed of the pelvis to demonstrate the iliac marrow spaces. The operative site was prepped and draped in the usual sterile fashion. Under sterile conditions and local anesthesia, a 22 gauge spinal needle was utilized for procedural planning. Next, an 11 gauge coaxial bone biopsy needle was advanced into the left iliac marrow space. Needle position was confirmed with CT imaging. Initially, a bone marrow aspiration was performed. Next, a bone marrow biopsy was obtained with the 11 gauge outer bone marrow device. Samples were prepared with the cytotechnologist and deemed adequate. The needle was removed and superficial hemostasis was obtained with manual compression. A dressing was applied. The patient tolerated the procedure well without immediate post procedural complication. IMPRESSION: Successful CT guided left iliac bone marrow aspiration and core  biopsy. Electronically Signed   By: Simonne Come M.D.   On: 05/10/2023 11:18   DG Swallowing Func-Speech Pathology  Result Date: 05/09/2023 Table formatting from the original result was not included. Modified Barium Swallow Study Patient Details Name: DONTREZ VANALSTINE MRN: [id number] Date of Birth: April 09, 1951 Today's Date: 05/09/2023 HPI/PMH: HPI: Patient is a 72 y.o. male with PMH: GERD, HTN, HLD, thyroid disease, multiple myeloma, large hiatal hernia, dysphagia. He had an MBS at a different hospital with SLP reporting penetration with thin liquids via straw sips but not with cup sips. He presented to the hospital on 05/05/23 after being found down in his bed with gurgling respirations, emesis and was suctioned for large amount of gastric contents; he was hypoxemic and found to have aspiration PNA. Clinical Impression: Clinical Impression: Pt presents with a mild oropharyngeal dysphagia characterized primarily by impaired timing for which he compensates for independently. Pt has trace vallecular residue with thicker consistencies, which he senses and independently clears his thoat intermittently to clear from his pharynx. With larger boluses, the head of the bolus reaches the laryngeal vestibule prior to epiglottic inversion and laryngeal elevation being initiated and results in trace, transient penetration (PAS 2) of thin liquids and nectar thick liquids via cup and straw. This is considered a normal age variant of swallowing and appears consistent with pt's previous MBS (10/20/22). This appeared to resolve with small, single cup sips. No further penetration or aspiration noted with honey thick liquids, purees, or solids. Pt was given the pill with thin liquids, which was noted to be retained in the esophagus during the flouro sweep. SLP provided subsequent bites of purees x2, which cleared the pill from the esophagus completely. Recommend pt continue diet of Dys 3 textures with thin liquids via small, controlled cup  sips. No further SLP f/u is necessary at this time. Factors that may increase risk of adverse event in presence of aspiration [name] & Clearance Coots 2021): Factors that may increase risk of adverse event in presence of aspiration [name] & Clearance Coots 2021): Reduced cognitive function Recommendations/Plan: Swallowing Evaluation Recommendations Swallowing Evaluation Recommendations Recommendations: PO diet PO Diet Recommendation: Dysphagia 3 (Mechanical  soft); Thin liquids (Level 0) Liquid Administration via: Cup; No straw Medication Administration: Whole meds with puree Supervision: Staff to assist with self-feeding; Full supervision/cueing for swallowing strategies Swallowing strategies  : Minimize environmental distractions; Slow rate; Small bites/sips Postural changes: Position pt fully upright for meals Oral care recommendations: Oral care BID (2x/day) Treatment Plan Treatment Plan Treatment recommendations: No treatment recommended at this time Follow-up recommendations: No SLP follow up Functional status assessment: Patient has not had a recent decline in their functional status. Recommendations Recommendations for follow up therapy are one component of a multi-disciplinary discharge planning process, led by the attending physician.  Recommendations may be updated based on patient status, additional functional criteria and insurance authorization. Assessment: Orofacial Exam: Orofacial Exam Oral Cavity: Oral Hygiene: WFL Oral Cavity - Dentition: Adequate natural dentition Orofacial Anatomy: WFL Oral Motor/Sensory Function: WFL Anatomy: Anatomy: Suspected cervical osteophytes Boluses Administered: Boluses Administered Boluses Administered: Thin liquids (Level 0); Mildly thick liquids (Level 2, nectar thick); Moderately thick liquids (Level 3, honey thick); Puree; Solid  Oral Impairment Domain: Oral Impairment Domain Lip Closure: No labial escape Tongue control during bolus hold: Cohesive bolus between tongue to palatal  seal Bolus preparation/mastication: Timely and efficient chewing and mashing Bolus transport/lingual motion: Brisk tongue motion Oral residue: Complete oral clearance Location of oral residue : N/A Initiation of pharyngeal swallow : Valleculae  Pharyngeal Impairment Domain: Pharyngeal Impairment Domain Soft palate elevation: No bolus between soft palate (SP)/pharyngeal wall (PW) Laryngeal elevation: Complete superior movement of thyroid cartilage with complete approximation of arytenoids to epiglottic petiole Anterior hyoid excursion: Complete anterior movement Epiglottic movement: Complete inversion Laryngeal vestibule closure: Incomplete, narrow column air/contrast in laryngeal vestibule Pharyngeal stripping wave : Present - complete Pharyngeal contraction (A/P view only): N/A Pharyngoesophageal segment opening: Complete distension and complete duration, no obstruction of flow Tongue base retraction: No contrast between tongue base and posterior pharyngeal wall (PPW) Pharyngeal residue: Trace residue within or on pharyngeal structures Location of pharyngeal residue: Valleculae  Esophageal Impairment Domain: Esophageal Impairment Domain Esophageal clearance upright position: Esophageal retention Pill: Pill Consistency administered: Thin liquids (Level 0); Puree Thin liquids (Level 0): Impaired (see clinical impressions) Puree: Impaired (see clinical impressions) Penetration/Aspiration Scale Score: Penetration/Aspiration Scale Score 1.  Material does not enter airway: Moderately thick liquids (Level 3, honey thick); Puree; Solid; Pill 2.  Material enters airway, remains ABOVE vocal cords then ejected out: Thin liquids (Level 0); Mildly thick liquids (Level 2, nectar thick) Compensatory Strategies: Compensatory Strategies Compensatory strategies: Yes Straw: Ineffective Ineffective Straw: Thin liquid (Level 0)   General Information: Caregiver present: No  Diet Prior to this Study: Dysphagia 3 (mechanical soft); Thin  liquids (Level 0)   Temperature : Normal   Respiratory Status: WFL   Supplemental O2: Nasal cannula   History of Recent Intubation: No  Behavior/Cognition: Alert; Cooperative; Pleasant mood Self-Feeding Abilities: Needs assist with self-feeding Baseline vocal quality/speech: Normal Volitional Cough: Able to elicit Volitional Swallow: Able to elicit Exam Limitations: No limitations Goal Planning: Prognosis for improved oropharyngeal function: Good Barriers to Reach Goals: Cognitive deficits No data recorded Patient/Family Stated Goal: nothing specific No data recorded Pain: Pain Assessment Pain Assessment: No/denies pain End of Session: Start Time:SLP Start Time (ACUTE ONLY): 0935 Stop Time: SLP Stop Time (ACUTE ONLY): 8413 Time Calculation:SLP Time Calculation (min) (ACUTE ONLY): 23 min Charges: SLP Evaluations $ SLP Speech Visit: 1 Visit SLP Evaluations $BSS Swallow: 1 Procedure $MBS Swallow: 1 Procedure SLP visit diagnosis: SLP Visit Diagnosis: Dysphagia, pharyngeal phase (R13.13) Past Medical History:  Past Medical History: Diagnosis Date  Allergy   Anemia   Arthritis   Asthma   no treatment x 20 years  Depression   Double vision   occurs at times   Duodenal ulcer   GERD (gastroesophageal reflux disease)   Hyperlipidemia   Hypertension   Hypothyroidism   Multiple myeloma 07/04/2011  Thyroid disease  Past Surgical History: Past Surgical History: Procedure Laterality Date  BONE MARROW TRANSPLANT  2011  for MM  CARDIOLITE STUDY  11/25/2003  NORMAL  TONSILLECTOMY   Gwynneth Aliment, M.A., CF-SLP Speech Language Pathology, Acute Rehabilitation Services Secure Chat preferred [phone] 05/09/2023, 1:49 PM   Assessment/Plan 1. Weakness Weaker and needing more assistance Also slightly confused from baseline  Concern for early signs of infection given recent hx Completed Cipro for pseudomonas UTI 06/03/23 Will order stat CBC CMP UA C and S and CXR  2. Hypoxia Currently on 3 liters  No sob or cough Will order  xray  3. Multiple myeloma, remission status unspecified (HCC) Followed by Dr Truett Perna, in clinical remission per notes.   4. Lumbosacral pain S/p fall Ordered 2 view xray     Family/ staff Communication: left message for wife [place].   Labs/tests ordered:  CBC CMP UA C and S and CXR

## 2023-06-08 ENCOUNTER — Encounter: Payer: Self-pay | Admitting: Adult Health

## 2023-06-08 ENCOUNTER — Non-Acute Institutional Stay (SKILLED_NURSING_FACILITY): Payer: Self-pay | Admitting: Adult Health

## 2023-06-08 DIAGNOSIS — R0902 Hypoxemia: Secondary | ICD-10-CM

## 2023-06-08 DIAGNOSIS — R531 Weakness: Secondary | ICD-10-CM | POA: Diagnosis not present

## 2023-06-08 DIAGNOSIS — C9000 Multiple myeloma not having achieved remission: Secondary | ICD-10-CM | POA: Diagnosis not present

## 2023-06-08 DIAGNOSIS — M545 Low back pain, unspecified: Secondary | ICD-10-CM | POA: Diagnosis not present

## 2023-06-08 MED ORDER — CIPROFLOXACIN HCL 500 MG PO TABS
500.0000 mg | ORAL_TABLET | Freq: Two times a day (BID) | ORAL | Status: DC
Start: 2023-06-08 — End: 2023-06-11

## 2023-06-08 NOTE — Progress Notes (Unsigned)
Location:  Medical illustrator of Service:  SNF (31) Provider:   Peggye Ley, ANP [facility] [phone]   [name], MD  Patient Care Team: [name], MD as PCP - General (Internal Medicine) Shirlean Schlein, MD as Consulting Physician (Neurology) Eddie Candle, MD as Consulting Physician (Internal Medicine) York Spaniel, MD (Inactive) as Consulting Physician (Neurology) Sharlett Iles, DDS as Referring Physician (Oral Surgery) Ladene Artist, MD as Consulting Physician (Oncology) Fletcher Anon, NP as Nurse Practitioner (Nurse Practitioner)  Extended Emergency Contact Information Primary Emergency Contact: Kosanke,[name] Address: [address]          [place], [place] [address] [name] of [place] Home Phone: [phone] Relation: Spouse  Code Status:  DNR Goals of care: Advanced Directive information    05/21/2023    4:00 AM  Advanced Directives  Does Patient Have a Medical Advance Directive? Yes  Type of Advance Directive Out of facility DNR (pink MOST or yellow form)  Does patient want to make changes to medical advance directive? No - Patient declined     Chief Complaint  Patient presents with   Acute Visit    Weakness and low grade fever.     HPI:  Pt is a 72 y.o. male seen today for an acute visit for weakness and low grade temp.    Admitted to hospital from 09/01-09/06 for Change of Mental status due to UTI/sepsis with pseudomonas  And also was admitted from 08/17-08/23 for Acute Hypoxic Respiratory failure and Pseudomonas Bacteremia  Both times he was discharged on Cipro.   Also PMH: diagnosed with multiple myeloma in 2012. Has received chemo, dexamethasone, and CAR T therapy.   Followed by heme/onc for multiple myeloma and thrombocytopenia. He received an Immunoglobulin infusion, which he does monthly.   Also has cognitive impairment, heme pos stool on Protonix, and  unsteady gait.   He  has fallen two times in the past two days. Had some low back pain and a xray was ordered which did not show an acute process. He did not hit his head. BP and pulse are ok Continues to required 3 liters of oxygen for the past several days sat 91%. CXR ordered 06/07/23 showed no acute process.  He remains weak and needs more assistance with ADLs.  He is alert but slightly more confused than baseline. Now having low grade temp 99.7.    CMP and CBC returned 06/07/23 WBC 2.6 Plt 22K, Hgb 9.2 Hct 26.7 MCV 133.7 Neut 72% NA 134 K 4.2 C02 26 Cl 99 BUN 22 Cr 1.07 Alb 3.7 Total Bil 0.4 Alk phos 81 AST 29 ALT 24 Ca 9.4    Past Medical History:  Diagnosis Date   Allergy    Anemia    Arthritis    Asthma    no treatment x 20 years   Depression    Double vision    occurs at times    Duodenal ulcer    GERD (gastroesophageal reflux disease)    Hemorrhage of rectum and anus 09/15/2013   Hyperlipidemia    Hypertension    Hypothyroidism    Leukopenia due to antineoplastic chemotherapy (HCC) 02/22/2017   Multiple myeloma 07/04/2011   Thyroid disease    TIA (transient ischemic attack) 03/27/2012   Transient diplopia 03/27/2012   Transient global amnesia 06/07/2015   Past Surgical History:  Procedure Laterality Date   BONE MARROW TRANSPLANT  2011   for MM  CARDIOLITE STUDY  11/25/2003   NORMAL   TONSILLECTOMY      Allergies  Allergen Reactions   Atorvastatin Other (See Comments)   Rosuvastatin     Other reaction(s): Liver Disorder   Crestor [Rosuvastatin Calcium]     ADVERSE EFFECTS ON LIVER   Lipitor [Atorvastatin Calcium]    Septra [Sulfamethoxazole-Trimethoprim] Rash    Outpatient Encounter Medications as of 06/08/2023  Medication Sig   acetaminophen (TYLENOL) 650 MG CR tablet Take 650 mg by mouth every 6 (six) hours as needed for pain.   calcium carbonate (TUMS EX) 750 MG chewable tablet Chew 2 tablets by mouth 3 (three) times daily as needed for heartburn.    calcium-vitamin D (OSCAL WITH D) 500-200 MG-UNIT tablet Take 1 tablet by mouth daily.   Cholecalciferol (VITAMIN D3) 25 MCG (1000 UT) capsule Take 1,000 Units by mouth daily.   cyanocobalamin (VITAMIN B12) 1000 MCG tablet Take 1 tablet (1,000 mcg total) by mouth daily.   dapsone 100 MG tablet Take 100 mg by mouth daily.   docusate sodium (COLACE) 100 MG capsule Take 100 mg by mouth daily as needed for mild constipation or moderate constipation.   escitalopram (LEXAPRO) 10 MG tablet Take 1 tablet (10 mg total) by mouth daily.   ferrous sulfate 325 (65 FE) MG EC tablet Take 325 mg by mouth 2 (two) times daily.   folic acid (FOLVITE) 1 MG tablet Take 1 tablet (1 mg total) by mouth daily.   levothyroxine (SYNTHROID) 125 MCG tablet Take 125 mcg by mouth daily.   methylphenidate (RITALIN) 5 MG tablet Take 0.5 tablets (2.5 mg total) by mouth daily. Each morning   pantoprazole (PROTONIX) 40 MG tablet Take 1 tablet (40 mg total) by mouth daily.   valACYclovir (VALTREX) 500 MG tablet Take 500 mg by mouth daily.   [DISCONTINUED] prochlorperazine (COMPAZINE) 10 MG tablet Take 1 tablet (10 mg total) by mouth every 6 (six) hours as needed (Nausea or vomiting).   No facility-administered encounter medications on file as of 06/08/2023.    Review of Systems  Constitutional:  Positive for activity change. Negative for appetite change, chills, diaphoresis, fatigue, fever and unexpected weight change.  Respiratory:  Negative for cough, shortness of breath, wheezing and stridor.   Cardiovascular:  Positive for leg swelling. Negative for chest pain and palpitations.  Gastrointestinal:  Negative for abdominal distention, abdominal pain, constipation and diarrhea.  Genitourinary:  Negative for difficulty urinating, dysuria, frequency and hematuria.  Musculoskeletal:  Positive for back pain (slightly improved) and gait problem. Negative for arthralgias, joint swelling and myalgias.  Neurological:  Positive for  weakness. Negative for dizziness, seizures, syncope, facial asymmetry, speech difficulty and headaches.  Hematological:  Negative for adenopathy. Does not bruise/bleed easily.  Psychiatric/Behavioral:  Positive for confusion. Negative for agitation and behavioral problems.     Immunization History  Administered Date(s) Administered   DTaP / Hep B / IPV 05/04/2017   DTaP / HiB / IPV 03/06/2018   Fluad Quad(high Dose 65+) 06/09/2019, 06/25/2020, 07/25/2021, 05/30/2022   Fluad Trivalent(High Dose 65+) 08/04/2012, 07/03/2014   HIB (PRP-T) 05/04/2017   Hepatitis B, ADULT 03/14/2017, 03/06/2018   IPV 01/28/1998   Influenza, High Dose Seasonal PF 06/16/2018   Influenza,inj,Quad PF,6+ Mos 06/16/2013, 07/03/2014, 06/11/2015, 05/31/2017   Influenza-Unspecified 07/29/2012   PFIZER(Purple Top)SARS-COV-2 Vaccination 10/26/2019, 11/20/2019, 07/22/2020   Pfizer Covid-19 Vaccine Bivalent Booster 69yrs & up 06/27/2021   Pfizer(Comirnaty)Fall Seasonal Vaccine 12 years and older 07/05/2022   Pneumococcal Conjugate-13 10/15/2013, 05/04/2017, 03/06/2018  Pneumococcal Polysaccharide-23 06/16/2013   Respiratory Syncytial Virus Vaccine,Recomb Aduvanted(Arexvy) 05/30/2022   Td 02/10/1998, 07/03/2014   Pertinent  Health Maintenance Due  Topic Date Due   Colonoscopy  09/24/2023   INFLUENZA VACCINE  06/21/2023 (Originally 04/19/2023)      07/26/2022   10:22 AM 08/18/2022    9:00 AM 08/30/2022   11:01 AM 09/06/2022   10:00 AM 02/15/2023    1:29 PM  Fall Risk  Falls in the past year?     1  Was there an injury with Fall?     1  Fall Risk Category Calculator     3  (RETIRED) Patient Fall Risk Level Low fall risk Low fall risk Low fall risk Low fall risk   Fall risk Follow up     Falls evaluation completed   Functional Status Survey:    Vitals:   06/08/23 1124  BP: (!) 138/53  Pulse: 76  Resp: 20  Temp: 99.7 F (37.6 C)  SpO2: 91%   There is no height or weight on file to calculate BMI. Physical  Exam Vitals and nursing note reviewed.  Constitutional:      General: He is not in acute distress.    Appearance: He is not diaphoretic.  HENT:     Head: Normocephalic and atraumatic.     Nose: Nose normal.     Mouth/Throat:     Mouth: Mucous membranes are moist.     Pharynx: Oropharynx is clear.  Eyes:     Extraocular Movements: Extraocular movements intact.     Conjunctiva/sclera: Conjunctivae normal.     Pupils: Pupils are equal, round, and reactive to light.  Neck:     Thyroid: No thyromegaly.     Vascular: No JVD.     Trachea: No tracheal deviation.  Cardiovascular:     Rate and Rhythm: Normal rate and regular rhythm.     Heart sounds: No murmur heard. Pulmonary:     Effort: Pulmonary effort is normal. No respiratory distress.     Breath sounds: Rales present. No wheezing.  Abdominal:     General: Bowel sounds are normal. There is no distension.     Palpations: Abdomen is soft.     Tenderness: There is no abdominal tenderness. There is no right CVA tenderness or left CVA tenderness.  Musculoskeletal:        General: No swelling, tenderness or deformity.     Comments: BLE edema +1 MAE  Lymphadenopathy:     Cervical: No cervical adenopathy.  Skin:    General: Skin is warm and dry.     Findings: Bruising (sacral area) present.  Neurological:     General: No focal deficit present.     Mental Status: He is alert. Mental status is at baseline.     Cranial Nerves: No cranial nerve deficit.     Motor: Weakness (general) present.     Coordination: Coordination normal.     Gait: Gait abnormal.  Psychiatric:        Mood and Affect: Mood normal.     Labs reviewed: Recent Labs    05/20/23 2320 05/22/23 0420 05/23/23 0413 06/06/23 0849  NA 131* 137 134* 138  K 3.7 4.0 3.7 3.9  CL 98 108 101 102  CO2 23 23 25 24   GLUCOSE 120* 93 97 106*  BUN 14 15 21 17   CREATININE 1.00 0.92 0.91 1.00  CALCIUM 8.9 9.2 8.9 9.4  MG 1.9 2.3 2.1  --   PHOS  --  2.3* 2.4*  --     Recent Labs    05/20/23 2320 05/22/23 0420 05/23/23 0413 06/06/23 0849  AST 35 29  --  27  ALT 35 35  --  22  ALKPHOS 79 66  --  80  BILITOT 0.9 0.7  --  0.6  PROT 6.1* 5.7*  --  6.7  ALBUMIN 3.4* 3.0* 2.9* 3.9   Recent Labs    05/20/23 1643 05/20/23 2320 05/22/23 0420 05/23/23 0413 06/06/23 0849  WBC 8.9 9.2 4.3 2.4* 3.4*  NEUTROABS 6.3 6.4  --   --  2.4  HGB 11.0* 9.5* 9.5* 8.9* 9.5*  HCT 33.6* 29.5* 29.8* 28.0* 28.8*  MCV 113.9* 113.0* 116.0* 115.7* 114.7*  PLT 40* 33* 32* 35* 25*   Lab Results  Component Value Date   TSH 0.173 (L) 05/20/2023   Lab Results  Component Value Date   HGBA1C 4.3 (L) 05/06/2023   Lab Results  Component Value Date   CHOL 178 02/13/2023   HDL 42 02/13/2023   LDLCALC 113 02/13/2023   LDLDIRECT 155.8 11/13/2012   TRIG 167 (A) 02/13/2023   CHOLHDL 4 09/06/2017    Significant Diagnostic Results in last 30 days:  CT HEAD WO CONTRAST ( )  Result Date: 05/24/2023 CLINICAL DATA:  Initial evaluation evaluate initial evaluation for trauma, fall. EXAM: CT HEAD WITHOUT CONTRAST TECHNIQUE: Contiguous axial images were obtained from the base of the skull through the vertex without intravenous contrast. RADIATION DOSE REDUCTION: This exam was performed according to the departmental dose-optimization program which includes automated exposure control, adjustment of the mA and/or kV according to patient size and/or use of iterative reconstruction technique. COMPARISON:  Prior study from 05/21/2023. FINDINGS: Brain: Cerebral volume within normal limits. Moderately advanced chronic microvascular ischemic disease with small remote left cerebellar infarct. No acute intracranial hemorrhage. No acute large vessel territory infarct. No mass lesion or midline shift. No hydrocephalus or extra-axial fluid collection. Vascular: No abnormal hyperdense vessel. Scattered calcified atherosclerosis present at the skull base. Skull: Scalp soft tissues demonstrate no  acute finding. Calvarium intact. Innumerable lucency seen throughout the calvarium, consistent with history of multiple myeloma. Sinuses/Orbits: Globes and orbital soft tissues demonstrate no acute finding. Mild mucosal thickening about the ethmoidal air cells. Paranasal sinuses are otherwise clear. No mastoid effusion. Other: None. IMPRESSION: 1. No acute intracranial abnormality. 2. Moderately advanced chronic microvascular ischemic disease with small remote left cerebellar infarct. 3. Innumerable lucency throughout the calvarium, consistent with history of multiple myeloma. Electronically Signed   By: Rise Mu M.D.   On: 05/24/2023 01:56   MR BRAIN WO CONTRAST  Result Date: 05/21/2023 CLINICAL DATA:  Delirium EXAM: MRI HEAD WITHOUT CONTRAST TECHNIQUE: Multiplanar, multiecho pulse sequences of the brain and surrounding structures were obtained without intravenous contrast. COMPARISON:  MRI June 2024. FINDINGS: Brain: No acute infarction, hemorrhage, hydrocephalus, extra-axial collection or mass lesion. Moderate for age T2/FLAIR hyperintensities the white matter, nonspecific but compatible with chronic microvascular ischemic disease. Cerebral atrophy. Remote cerebellar infarct. Vascular: Major arterial flow voids are maintained at the skull base. Skull and upper cervical spine: Heterogeneous bone marrow. Sinuses/Orbits: Clear sinuses.  No acute orbital findings. Other: No mastoid effusions. IMPRESSION: 1. No acute abnormality. 2. Chronic microvascular ischemic disease and remote cerebellar infarct. 3. Heterogeneous bone marrow, compatible with reported history multiple myeloma. Electronically Signed   By: Feliberto Harts M.D.   On: 05/21/2023 15:59   CT Angio Chest PE W and/or Wo Contrast  Result Date: 05/20/2023 CLINICAL DATA:  Pulmonary embolism (PE) suspected, high prob. Found altered. Responsive to pain. EXAM: CT ANGIOGRAPHY CHEST WITH CONTRAST TECHNIQUE: Multidetector CT imaging of the  chest was performed using the standard protocol during bolus administration of intravenous contrast. Multiplanar CT image reconstructions and MIPs were obtained to evaluate the vascular anatomy. RADIATION DOSE REDUCTION: This exam was performed according to the departmental dose-optimization program which includes automated exposure control, adjustment of the mA and/or kV according to patient size and/or use of iterative reconstruction technique. CONTRAST:  75mL OMNIPAQUE IOHEXOL 350 MG/ML SOLN COMPARISON:  05/05/2023 FINDINGS: Cardiovascular: No filling defects in the pulmonary arteries to suggest pulmonary emboli. Heart is enlarged. Coronary artery and aortic atherosclerosis. No aneurysm. Mediastinum/Nodes: No mediastinal, hilar, or axillary adenopathy. Trachea and esophagus are unremarkable. Thyroid unremarkable. Large hiatal hernia. Lungs/Pleura: Elevation and eventration of the right hemidiaphragm. Bibasilar atelectasis. No effusions. Upper Abdomen: No acute findings. Small layering gallstone within the gallbladder. Musculoskeletal: Chest wall soft tissues are unremarkable. Multiple old healing bilateral rib fractures. Chronic mild T12 compression fracture, stable. Chronic patchy lucencies throughout the osseous structures, unchanged. Review of the MIP images confirms the above findings. IMPRESSION: No evidence of pulmonary embolus. Cardiomegaly, coronary artery disease. Bibasilar atelectasis. Large hiatal hernia. Stable chronic patchy lucencies throughout the osseous structures likely related to multiple myeloma. No acute bony abnormality. Aortic Atherosclerosis (ICD10-I70.0). Electronically Signed   By: Charlett Nose M.D.   On: 05/20/2023 20:16   CT Head Wo Contrast  Result Date: 05/20/2023 CLINICAL DATA:  Found altered prior to arrival. Mental status change. Responsive to pain. EXAM: CT HEAD WITHOUT CONTRAST TECHNIQUE: Contiguous axial images were obtained from the base of the skull through the vertex  without intravenous contrast. RADIATION DOSE REDUCTION: This exam was performed according to the departmental dose-optimization program which includes automated exposure control, adjustment of the mA and/or kV according to patient size and/or use of iterative reconstruction technique. COMPARISON:  CT head 04/20/2023 FINDINGS: Brain: No intracranial hemorrhage, mass effect, or evidence of acute infarct. No hydrocephalus. No extra-axial fluid collection. Age-commensurate cerebral atrophy and ill-defined hypoattenuation within the cerebral white matter consistent with chronic small vessel ischemic disease. Chronic left cerebellar infarct. Vascular: No hyperdense vessel. Intracranial arterial calcification. Skull: No fracture. Multiple lucencies are present throughout the calvarium, similar to prior Sinuses/Orbits: No acute finding. Paranasal sinuses and mastoid air cells are well aerated. Other: None. IMPRESSION: 1. Unchanged CT head from 04/20/2023. No acute intracranial abnormality. Electronically Signed   By: Minerva Fester M.D.   On: 05/20/2023 17:32   DG Chest Portable 1 View  Result Date: 05/20/2023 CLINICAL DATA:  Hypoxia. Concern for pneumonia. Earlier today was at baseline but found altered prior to arrival. EXAM: PORTABLE CHEST 1 VIEW COMPARISON:  Chest radiographs 05/06/2023, 05/05/2023, 01/11/2023; CT chest, abdomen, and pelvis 05/05/2023 FINDINGS: There is again moderate elevation of the right hemidiaphragm. Cardiac silhouette is at the upper limits of normal size for AP technique. Retrocardiac opacity is seen corresponding to the known large hiatal hernia better seen on prior CT. Mild bibasilar linear subsegmental atelectasis is similar to prior. No pneumothorax. Old healed posterior right rib fractures. IMPRESSION: 1. Mild bibasilar linear subsegmental atelectasis is similar to prior. 2. Large hiatal hernia. Electronically Signed   By: Neita Garnet M.D.   On: 05/20/2023 17:32   CT BONE MARROW  BIOPSY & ASPIRATION  Result Date: 05/10/2023 INDICATION: History of multiple myeloma, now with pancytopenia. Please perform CT-guided bone marrow biopsy for tissue diagnostic purposes. EXAM: CT-GUIDED BONE MARROW BIOPSY AND ASPIRATION  MEDICATIONS: None ANESTHESIA/SEDATION: Moderate (conscious) sedation was employed during this procedure as administered by the Interventional Radiology RN. A total of Versed 1 mg and Fentanyl 75 mcg was administered intravenously. Moderate Sedation Time: 10 minutes. [name] level of consciousness and vital signs were monitored continuously by radiology nursing throughout the procedure under my direct supervision. COMPLICATIONS: None immediate. PROCEDURE: Informed consent was obtained from the patient following an explanation of the procedure, risks, benefits and alternatives. The patient understands, agrees and consents for the procedure. All questions were addressed. A time out was performed prior to the initiation of the procedure. The patient was positioned prone and non-contrast localization CT was performed of the pelvis to demonstrate the iliac marrow spaces. The operative site was prepped and draped in the usual sterile fashion. Under sterile conditions and local anesthesia, a 22 gauge spinal needle was utilized for procedural planning. Next, an 11 gauge coaxial bone biopsy needle was advanced into the left iliac marrow space. Needle position was confirmed with CT imaging. Initially, a bone marrow aspiration was performed. Next, a bone marrow biopsy was obtained with the 11 gauge outer bone marrow device. Samples were prepared with the cytotechnologist and deemed adequate. The needle was removed and superficial hemostasis was obtained with manual compression. A dressing was applied. The patient tolerated the procedure well without immediate post procedural complication. IMPRESSION: Successful CT guided left iliac bone marrow aspiration and core biopsy. Electronically  Signed   By: Simonne Come M.D.   On: 05/10/2023 11:18    Assessment/Plan 1. Weakness Weaker and needing more assistance Also slightly confused from baseline  Concern for early signs of infection given recent hx Completed Cipro for pseudomonas UTI 06/03/23  2. Hypoxia Currently on 3 liters  No sob or cough Xray negative Using IS   3. Multiple myeloma, remission status unspecified (HCC) Followed by Dr Truett Perna, in clinical remission per notes.   4. Lumbosacral pain S/p fall Xray negative    Family/ staff Communication:    Labs/tests ordered:

## 2023-06-11 ENCOUNTER — Non-Acute Institutional Stay (SKILLED_NURSING_FACILITY): Payer: Self-pay | Admitting: Internal Medicine

## 2023-06-11 ENCOUNTER — Encounter: Payer: Self-pay | Admitting: Internal Medicine

## 2023-06-11 DIAGNOSIS — R296 Repeated falls: Secondary | ICD-10-CM | POA: Diagnosis not present

## 2023-06-11 DIAGNOSIS — D61818 Other pancytopenia: Secondary | ICD-10-CM

## 2023-06-11 DIAGNOSIS — R0902 Hypoxemia: Secondary | ICD-10-CM | POA: Diagnosis not present

## 2023-06-11 DIAGNOSIS — R531 Weakness: Secondary | ICD-10-CM

## 2023-06-11 NOTE — Progress Notes (Signed)
Location:  [facility] Room Number: 153A Place of Service:  SNF [phone]) Provider:  [name], MD   [name], MD  Patient Care Team: [name], MD as PCP - General (Internal Medicine) Shirlean Schlein, MD as Consulting Physician (Neurology) Eddie Candle, MD as Consulting Physician (Internal Medicine) York Spaniel, MD (Inactive) as Consulting Physician (Neurology) Sharlett Iles, DDS as Referring Physician (Oral Surgery) Ladene Artist, MD as Consulting Physician (Oncology) Fletcher Anon, NP as Nurse Practitioner (Nurse Practitioner)  Extended Emergency Contact Information Primary Emergency Contact: Conaway,[name] Address: [address]          [place], [place] [address] [name] of [place] Home Phone: [phone] Relation: Spouse  Code Status:  DNR Goals of care: Advanced Directive information    06/11/2023   11:21 AM  Advanced Directives  Does Patient Have a Medical Advance Directive? Yes  Type of Advance Directive Out of facility DNR (pink MOST or yellow form)  Does patient want to make changes to medical advance directive? No - Patient declined  Pre-existing out of facility DNR order (yellow form or pink MOST form) Yellow form placed in chart (order not valid for inpatient use)     Chief Complaint  Patient presents with   Acute Visit    Patient is being seen for a acute visit   Immunizations    Patient is due for a pneumonia and flu vaccine.    Health Maintenance    Patient is due for a AWV and colonoscopy    HPI:  Pt is a 72 y.o. male seen today for an acute visit for Weakness and Hypoxia Admitted in Rehab in [place]  Admitted to hospital from 09/01-09/06 for Change of Mental status due to UTI With Pseudomonas   Was also Admitted from 08/17-08/23 for Acute Hypoxic Respiratory failure and Pseudomonas Bacteremia   Was on Oral Cipro till 06/03/2023 Was able to walk with assist  Baseline mental status On 09/19 started c/o Feeling weak Could not get up also Hypoxic confused Low grade temp of 99 -99.7 with no Chills Refused to go to ED Labs did not show anything Acute Urine was negative Restarted on Cipro on 09/20 Today feels better Getting up more Able to do his ADLS Still feels weak No Cough Chest pain   Other issues Patient has h/o Multiple Myeloma diagnosed in 2012 Recently Post CAR T therapy per Dr Marissa Calamity on 01/24  In Clinical Remission On IVIG monthly per Oncology [facility] due to CART and Chronic Microvascular changes On Ritalin Recurent falls due to Unstable Gait Anemia with Occult positive stools Pancytopenia due to above   Past Medical History:  Diagnosis Date   Allergy    Anemia    Arthritis    Asthma    no treatment x 20 years   Depression    Double vision    occurs at times    Duodenal ulcer    GERD (gastroesophageal reflux disease)    Hemorrhage of rectum and anus 09/15/2013   Hyperlipidemia    Hypertension    Hypothyroidism    Leukopenia due to antineoplastic chemotherapy (HCC) 02/22/2017   Multiple myeloma 07/04/2011   Thyroid disease    TIA (transient ischemic attack) 03/27/2012   Transient diplopia 03/27/2012   Transient global amnesia 06/07/2015   Past Surgical History:  Procedure Laterality Date   BONE MARROW TRANSPLANT  2011   for MM   CARDIOLITE STUDY  11/25/2003  NORMAL   TONSILLECTOMY      Allergies  Allergen Reactions   Atorvastatin Other (See Comments)   Rosuvastatin     Other reaction(s): Liver Disorder   Crestor [Rosuvastatin Calcium]     ADVERSE EFFECTS ON LIVER   Lipitor [Atorvastatin Calcium]    Septra [Sulfamethoxazole-Trimethoprim] Rash    Outpatient Encounter Medications as of 06/11/2023  Medication Sig   acetaminophen (TYLENOL) 650 MG CR tablet Take 650 mg by mouth every 6 (six) hours as needed for pain.   calcium carbonate (TUMS EX) 750 MG chewable tablet Chew 2 tablets by mouth 3 (three) times  daily as needed for heartburn.   calcium-vitamin D (OSCAL WITH D) 500-200 MG-UNIT tablet Take 1 tablet by mouth daily.   Cholecalciferol (VITAMIN D3) 25 MCG (1000 UT) capsule Take 1,000 Units by mouth daily.   cyanocobalamin (VITAMIN B12) 1000 MCG tablet Take 1 tablet (1,000 mcg total) by mouth daily.   dapsone 100 MG tablet Take 100 mg by mouth daily.   docusate sodium (COLACE) 100 MG capsule Take 100 mg by mouth daily as needed for mild constipation or moderate constipation.   escitalopram (LEXAPRO) 10 MG tablet Take 1 tablet (10 mg total) by mouth daily.   ferrous sulfate 325 (65 FE) MG EC tablet Take 325 mg by mouth 2 (two) times daily.   folic acid (FOLVITE) 1 MG tablet Take 1 tablet (1 mg total) by mouth daily.   levothyroxine (SYNTHROID) 125 MCG tablet Take 125 mcg by mouth daily.   pantoprazole (PROTONIX) 40 MG tablet Take 1 tablet (40 mg total) by mouth daily.   valACYclovir (VALTREX) 500 MG tablet Take 500 mg by mouth daily.   methylphenidate (RITALIN) 5 MG tablet Take 0.5 tablets (2.5 mg total) by mouth daily. Each morning   [DISCONTINUED] ciprofloxacin (CIPRO) 500 MG tablet Take 1 tablet (500 mg total) by mouth 2 (two) times daily for 14 days.   [DISCONTINUED] prochlorperazine (COMPAZINE) 10 MG tablet Take 1 tablet (10 mg total) by mouth every 6 (six) hours as needed (Nausea or vomiting).   No facility-administered encounter medications on file as of 06/11/2023.    Review of Systems  Constitutional:  Positive for activity change. Negative for appetite change and unexpected weight change.  HENT: Negative.    Respiratory:  Negative for cough and shortness of breath.   Cardiovascular:  Negative for leg swelling.  Gastrointestinal:  Negative for constipation.  Genitourinary:  Negative for frequency.  Musculoskeletal:  Positive for gait problem. Negative for arthralgias and myalgias.  Skin: Negative.  Negative for rash.  Neurological:  Positive for weakness. Negative for dizziness.   Psychiatric/Behavioral:  Positive for confusion. Negative for sleep disturbance.   All other systems reviewed and are negative.   Immunization History  Administered Date(s) Administered   DTaP / Hep B / IPV 05/04/2017   DTaP / HiB / IPV 03/06/2018   Fluad Quad(high Dose 65+) 06/09/2019, 06/25/2020, 07/25/2021, 05/30/2022   Fluad Trivalent(High Dose 65+) 08/04/2012, 07/03/2014   HIB (PRP-T) 05/04/2017   Hepatitis B, ADULT 03/14/2017, 03/06/2018   IPV 01/28/1998   Influenza, High Dose Seasonal PF 06/16/2018   Influenza,inj,Quad PF,6+ Mos 06/16/2013, 07/03/2014, 06/11/2015, 05/31/2017   Influenza-Unspecified 07/29/2012   PFIZER(Purple Top)SARS-COV-2 Vaccination 10/26/2019, 11/20/2019, 07/22/2020   Pfizer Covid-19 Vaccine Bivalent Booster 69yrs & up 06/27/2021   Pfizer(Comirnaty)Fall Seasonal Vaccine 12 years and older 07/05/2022   Pneumococcal Conjugate-13 10/15/2013, 05/04/2017, 03/06/2018   Pneumococcal Polysaccharide-23 06/16/2013   Respiratory Syncytial Virus Vaccine,Recomb Aduvanted(Arexvy) 05/30/2022  Td 02/10/1998, 07/03/2014   Pertinent  Health Maintenance Due  Topic Date Due   Colonoscopy  09/24/2023   INFLUENZA VACCINE  06/21/2023 (Originally 04/19/2023)      08/18/2022    9:00 AM 08/30/2022   11:01 AM 09/06/2022   10:00 AM 02/15/2023    1:29 PM 06/11/2023   11:20 AM  Fall Risk  Falls in the past year?    1 0  Was there an injury with Fall?    1 0  Fall Risk Category Calculator    3 0  (RETIRED) Patient Fall Risk Level Low fall risk Low fall risk Low fall risk    Patient at Risk for Falls Due to     History of fall(s)  Fall risk Follow up    Falls evaluation completed Falls evaluation completed   Functional Status Survey:    Vitals:   06/11/23 1117  BP: 110/64  Pulse: 69  Resp: 16  Temp: 99 F (37.2 C)  TempSrc: Temporal  SpO2: 91%  Weight: 174 lb 3.2 oz (79 kg)  Height: 5\' 7"  (1.702 m)   Body mass index is 27.28 kg/m. Physical Exam Vitals reviewed.   Constitutional:      Appearance: Normal appearance.  HENT:     Head: Normocephalic.     Nose: Nose normal.     Mouth/Throat:     Mouth: Mucous membranes are moist.     Pharynx: Oropharynx is clear.  Eyes:     Pupils: Pupils are equal, round, and reactive to light.  Cardiovascular:     Rate and Rhythm: Normal rate and regular rhythm.     Pulses: Normal pulses.     Heart sounds: No murmur heard. Pulmonary:     Effort: Pulmonary effort is normal. No respiratory distress.     Breath sounds: Normal breath sounds. No rales.  Abdominal:     General: Abdomen is flat. Bowel sounds are normal.     Palpations: Abdomen is soft.  Musculoskeletal:        General: No swelling.     Cervical back: Neck supple.     Comments: Mild edema Bilateral  Skin:    General: Skin is warm.  Neurological:     General: No focal deficit present.     Mental Status: He is alert.  Psychiatric:        Mood and Affect: Mood normal.        Thought Content: Thought content normal.    Labs reviewed: Recent Labs    05/20/23 2320 05/22/23 0420 05/23/23 0413 06/06/23 0849  NA 131* 137 134* 138  K 3.7 4.0 3.7 3.9  CL 98 108 101 102  CO2 23 23 25 24   GLUCOSE 120* 93 97 106*  BUN 14 15 21 17   CREATININE 1.00 0.92 0.91 1.00  CALCIUM 8.9 9.2 8.9 9.4  MG 1.9 2.3 2.1  --   PHOS  --  2.3* 2.4*  --    Recent Labs    05/20/23 2320 05/22/23 0420 05/23/23 0413 06/06/23 0849  AST 35 29  --  27  ALT 35 35  --  22  ALKPHOS 79 66  --  80  BILITOT 0.9 0.7  --  0.6  PROT 6.1* 5.7*  --  6.7  ALBUMIN 3.4* 3.0* 2.9* 3.9   Recent Labs    05/20/23 1643 05/20/23 2320 05/22/23 0420 05/23/23 0413 06/06/23 0849  WBC 8.9 9.2 4.3 2.4* 3.4*  NEUTROABS 6.3 6.4  --   --  2.4  HGB 11.0* 9.5* 9.5* 8.9* 9.5*  HCT 33.6* 29.5* 29.8* 28.0* 28.8*  MCV 113.9* 113.0* 116.0* 115.7* 114.7*  PLT 40* 33* 32* 35* 25*   Lab Results  Component Value Date   TSH 0.173 (L) 05/20/2023   Lab Results  Component Value Date    HGBA1C 4.3 (L) 05/06/2023   Lab Results  Component Value Date   CHOL 178 02/13/2023   HDL 42 02/13/2023   LDLCALC 113 02/13/2023   LDLDIRECT 155.8 11/13/2012   TRIG 167 (A) 02/13/2023   CHOLHDL 4 09/06/2017    Significant Diagnostic Results in last 30 days:  CT HEAD WO CONTRAST ( )  Result Date: 05/24/2023 CLINICAL DATA:  Initial evaluation evaluate initial evaluation for trauma, fall. EXAM: CT HEAD WITHOUT CONTRAST TECHNIQUE: Contiguous axial images were obtained from the base of the skull through the vertex without intravenous contrast. RADIATION DOSE REDUCTION: This exam was performed according to the departmental dose-optimization program which includes automated exposure control, adjustment of the mA and/or kV according to patient size and/or use of iterative reconstruction technique. COMPARISON:  Prior study from 05/21/2023. FINDINGS: Brain: Cerebral volume within normal limits. Moderately advanced chronic microvascular ischemic disease with small remote left cerebellar infarct. No acute intracranial hemorrhage. No acute large vessel territory infarct. No mass lesion or midline shift. No hydrocephalus or extra-axial fluid collection. Vascular: No abnormal hyperdense vessel. Scattered calcified atherosclerosis present at the skull base. Skull: Scalp soft tissues demonstrate no acute finding. Calvarium intact. Innumerable lucency seen throughout the calvarium, consistent with history of multiple myeloma. Sinuses/Orbits: Globes and orbital soft tissues demonstrate no acute finding. Mild mucosal thickening about the ethmoidal air cells. Paranasal sinuses are otherwise clear. No mastoid effusion. Other: None. IMPRESSION: 1. No acute intracranial abnormality. 2. Moderately advanced chronic microvascular ischemic disease with small remote left cerebellar infarct. 3. Innumerable lucency throughout the calvarium, consistent with history of multiple myeloma. Electronically Signed   By: Rise Mu M.D.   On: 05/24/2023 01:56   MR BRAIN WO CONTRAST  Result Date: 05/21/2023 CLINICAL DATA:  Delirium EXAM: MRI HEAD WITHOUT CONTRAST TECHNIQUE: Multiplanar, multiecho pulse sequences of the brain and surrounding structures were obtained without intravenous contrast. COMPARISON:  MRI June 2024. FINDINGS: Brain: No acute infarction, hemorrhage, hydrocephalus, extra-axial collection or mass lesion. Moderate for age T2/FLAIR hyperintensities the white matter, nonspecific but compatible with chronic microvascular ischemic disease. Cerebral atrophy. Remote cerebellar infarct. Vascular: Major arterial flow voids are maintained at the skull base. Skull and upper cervical spine: Heterogeneous bone marrow. Sinuses/Orbits: Clear sinuses.  No acute orbital findings. Other: No mastoid effusions. IMPRESSION: 1. No acute abnormality. 2. Chronic microvascular ischemic disease and remote cerebellar infarct. 3. Heterogeneous bone marrow, compatible with reported history multiple myeloma. Electronically Signed   By: Feliberto Harts M.D.   On: 05/21/2023 15:59   CT Angio Chest PE W and/or Wo Contrast  Result Date: 05/20/2023 CLINICAL DATA:  Pulmonary embolism (PE) suspected, high prob. Found altered. Responsive to pain. EXAM: CT ANGIOGRAPHY CHEST WITH CONTRAST TECHNIQUE: Multidetector CT imaging of the chest was performed using the standard protocol during bolus administration of intravenous contrast. Multiplanar CT image reconstructions and MIPs were obtained to evaluate the vascular anatomy. RADIATION DOSE REDUCTION: This exam was performed according to the departmental dose-optimization program which includes automated exposure control, adjustment of the mA and/or kV according to patient size and/or use of iterative reconstruction technique. CONTRAST:  75mL OMNIPAQUE IOHEXOL 350 MG/ML SOLN COMPARISON:  05/05/2023 FINDINGS: Cardiovascular: No filling defects in  the pulmonary arteries to suggest pulmonary emboli.  Heart is enlarged. Coronary artery and aortic atherosclerosis. No aneurysm. Mediastinum/Nodes: No mediastinal, hilar, or axillary adenopathy. Trachea and esophagus are unremarkable. Thyroid unremarkable. Large hiatal hernia. Lungs/Pleura: Elevation and eventration of the right hemidiaphragm. Bibasilar atelectasis. No effusions. Upper Abdomen: No acute findings. Small layering gallstone within the gallbladder. Musculoskeletal: Chest wall soft tissues are unremarkable. Multiple old healing bilateral rib fractures. Chronic mild T12 compression fracture, stable. Chronic patchy lucencies throughout the osseous structures, unchanged. Review of the MIP images confirms the above findings. IMPRESSION: No evidence of pulmonary embolus. Cardiomegaly, coronary artery disease. Bibasilar atelectasis. Large hiatal hernia. Stable chronic patchy lucencies throughout the osseous structures likely related to multiple myeloma. No acute bony abnormality. Aortic Atherosclerosis (ICD10-I70.0). Electronically Signed   By: Charlett Nose M.D.   On: 05/20/2023 20:16   CT Head Wo Contrast  Result Date: 05/20/2023 CLINICAL DATA:  Found altered prior to arrival. Mental status change. Responsive to pain. EXAM: CT HEAD WITHOUT CONTRAST TECHNIQUE: Contiguous axial images were obtained from the base of the skull through the vertex without intravenous contrast. RADIATION DOSE REDUCTION: This exam was performed according to the departmental dose-optimization program which includes automated exposure control, adjustment of the mA and/or kV according to patient size and/or use of iterative reconstruction technique. COMPARISON:  CT head 04/20/2023 FINDINGS: Brain: No intracranial hemorrhage, mass effect, or evidence of acute infarct. No hydrocephalus. No extra-axial fluid collection. Age-commensurate cerebral atrophy and ill-defined hypoattenuation within the cerebral white matter consistent with chronic small vessel ischemic disease. Chronic left  cerebellar infarct. Vascular: No hyperdense vessel. Intracranial arterial calcification. Skull: No fracture. Multiple lucencies are present throughout the calvarium, similar to prior Sinuses/Orbits: No acute finding. Paranasal sinuses and mastoid air cells are well aerated. Other: None. IMPRESSION: 1. Unchanged CT head from 04/20/2023. No acute intracranial abnormality. Electronically Signed   By: Minerva Fester M.D.   On: 05/20/2023 17:32   DG Chest Portable 1 View  Result Date: 05/20/2023 CLINICAL DATA:  Hypoxia. Concern for pneumonia. Earlier today was at baseline but found altered prior to arrival. EXAM: PORTABLE CHEST 1 VIEW COMPARISON:  Chest radiographs 05/06/2023, 05/05/2023, 01/11/2023; CT chest, abdomen, and pelvis 05/05/2023 FINDINGS: There is again moderate elevation of the right hemidiaphragm. Cardiac silhouette is at the upper limits of normal size for AP technique. Retrocardiac opacity is seen corresponding to the known large hiatal hernia better seen on prior CT. Mild bibasilar linear subsegmental atelectasis is similar to prior. No pneumothorax. Old healed posterior right rib fractures. IMPRESSION: 1. Mild bibasilar linear subsegmental atelectasis is similar to prior. 2. Large hiatal hernia. Electronically Signed   By: Neita Garnet M.D.   On: 05/20/2023 17:32    Assessment/Plan 1. Weakness ? Etiology Patient is feeling better on Cipro now His Mental status is back to baseline He is still weak but now able to walk with assist UA was negative and Chest Xray was negative Labs did not show anything Acute or concerning  Blood Cultures if Temp more then 99 Also Making Infectious disease Consult for possible recurrent Sepsis 2. Hypoxia SOB has Improved also now that he is now on  Cipro Chest Xray showed Will Continue to follow  3. Pancytopenia (HCC) Bone marrow Biopsy did not show any recurrence of Multiple Mylemo Follows with Oncology  4. Recurrent falls Due to his Cognition and  Neuropathy   Other issues  Iron deficiency anemia due to chronic blood loss On Iron Not candidate for further work up right  now due to Low Platelets    Neuropathy due to chemotherapeutic drug (HCC) With recurrent falls due to his cognition and Poor Awareness Working with therapy   Multiple myeloma with Clinical remission On IVIG Per Oncology On Dapsone and Valtrex for Prophylaxis   Memory problem Combination of Vascular disease and CART  7Acquired hypothyroidism TSH was low in Dr Fredderick Erb office Synthroid has been decreased to 125 mcg Repeat TSH was still low in Hospital  Will repeat in 4 weeks and change dose accordingly Depression On Lexapro Dysphagia Mech Soft diet   Family/ staff Communication: Discussed with Wife  Labs/tests ordered:

## 2023-06-13 ENCOUNTER — Non-Acute Institutional Stay (SKILLED_NURSING_FACILITY): Payer: Self-pay | Admitting: Orthopedic Surgery

## 2023-06-13 ENCOUNTER — Encounter: Payer: Self-pay | Admitting: Orthopedic Surgery

## 2023-06-13 DIAGNOSIS — R296 Repeated falls: Secondary | ICD-10-CM | POA: Diagnosis not present

## 2023-06-13 DIAGNOSIS — R0781 Pleurodynia: Secondary | ICD-10-CM

## 2023-06-13 MED ORDER — LIDOCAINE 4 % EX PTCH
1.0000 | MEDICATED_PATCH | CUTANEOUS | Status: DC
Start: 2023-06-13 — End: 2023-06-25

## 2023-06-13 NOTE — Progress Notes (Signed)
Location:  [facility] Room Number: 153/A Place of Service:  SNF [phone]) Provider:  Octavia Heir, NP   [name], MD  Patient Care Team: [name], MD as PCP - General (Internal Medicine) Shirlean Schlein, MD as Consulting Physician (Neurology) Eddie Candle, MD as Consulting Physician (Internal Medicine) York Spaniel, MD (Inactive) as Consulting Physician (Neurology) Sharlett Iles, DDS as Referring Physician (Oral Surgery) Ladene Artist, MD as Consulting Physician (Oncology) Fletcher Anon, NP as Nurse Practitioner (Nurse Practitioner)  Extended Emergency Contact Information Primary Emergency Contact: Striplin,[name] Address: [address]          [place], [place] [address] [name] of [place] Home Phone: [phone] Relation: Spouse  Code Status:  DNR Goals of care: Advanced Directive information    06/11/2023   11:21 AM  Advanced Directives  Does Patient Have a Medical Advance Directive? Yes  Type of Advance Directive Out of facility DNR (pink MOST or yellow form)  Does patient want to make changes to medical advance directive? No - Patient declined  Pre-existing out of facility DNR order (yellow form or pink MOST form) Yellow form placed in chart (order not valid for inpatient use)     Chief Complaint  Patient presents with   Acute Visit    Left sided rib pain    HPI:  Pt is a 72 y.o. male seen today for acute visit due to left sided chest pain.   He currently resides on the rehab unit at [facility]. PMH: TIA, asthma, hypoxia OSA, GERD, peptic ulcer disease, hypothyroidism, chemotherapy related neuropathy, multiple myeloma, memory impairment, pancytopenia, weakness and constipation.   He reports increased left sided pain with inspiration. He had a fall within last 1-2 weeks after recent hospitalization. Fall 09/06 and 09/19 per chart review> no apparent injury at time. Denies chest pain,  shortness of breath or dizziness. Afebrile. Vitals stable.   Past Medical History:  Diagnosis Date   Allergy    Anemia    Arthritis    Asthma    no treatment x 20 years   Depression    Double vision    occurs at times    Duodenal ulcer    GERD (gastroesophageal reflux disease)    Hemorrhage of rectum and anus 09/15/2013   Hyperlipidemia    Hypertension    Hypothyroidism    Leukopenia due to antineoplastic chemotherapy (HCC) 02/22/2017   Multiple myeloma 07/04/2011   Thyroid disease    TIA (transient ischemic attack) 03/27/2012   Transient diplopia 03/27/2012   Transient global amnesia 06/07/2015   Past Surgical History:  Procedure Laterality Date   BONE MARROW TRANSPLANT  2011   for MM   CARDIOLITE STUDY  11/25/2003   NORMAL   TONSILLECTOMY      Allergies  Allergen Reactions   Atorvastatin Other (See Comments)   Rosuvastatin     Other reaction(s): Liver Disorder   Crestor [Rosuvastatin Calcium]     ADVERSE EFFECTS ON LIVER   Lipitor [Atorvastatin Calcium]    Septra [Sulfamethoxazole-Trimethoprim] Rash    Outpatient Encounter Medications as of 06/13/2023  Medication Sig   acetaminophen (TYLENOL) 650 MG CR tablet Take 650 mg by mouth every 6 (six) hours as needed for pain.   calcium carbonate (TUMS EX) 750 MG chewable tablet Chew 2 tablets by mouth 3 (three) times daily as needed for heartburn.   calcium-vitamin D (OSCAL WITH D) 500-200 MG-UNIT tablet Take 1 tablet by mouth daily.  Cholecalciferol (VITAMIN D3) 25 MCG (1000 UT) capsule Take 1,000 Units by mouth daily.   cyanocobalamin (VITAMIN B12) 1000 MCG tablet Take 1 tablet (1,000 mcg total) by mouth daily.   dapsone 100 MG tablet Take 100 mg by mouth daily.   docusate sodium (COLACE) 100 MG capsule Take 100 mg by mouth daily as needed for mild constipation or moderate constipation.   escitalopram (LEXAPRO) 10 MG tablet Take 1 tablet (10 mg total) by mouth daily.   ferrous sulfate 325 (65 FE) MG EC tablet Take  325 mg by mouth 2 (two) times daily.   folic acid (FOLVITE) 1 MG tablet Take 1 tablet (1 mg total) by mouth daily.   levothyroxine (SYNTHROID) 125 MCG tablet Take 125 mcg by mouth daily.   methylphenidate (RITALIN) 5 MG tablet Take 0.5 tablets (2.5 mg total) by mouth daily. Each morning   pantoprazole (PROTONIX) 40 MG tablet Take 1 tablet (40 mg total) by mouth daily.   valACYclovir (VALTREX) 500 MG tablet Take 500 mg by mouth daily.   [DISCONTINUED] prochlorperazine (COMPAZINE) 10 MG tablet Take 1 tablet (10 mg total) by mouth every 6 (six) hours as needed (Nausea or vomiting).   No facility-administered encounter medications on file as of 06/13/2023.    Review of Systems  Constitutional:  Positive for fatigue. Negative for activity change and appetite change.  HENT: Negative.    Eyes: Negative.   Respiratory:  Negative for shortness of breath.   Cardiovascular:  Negative for chest pain and leg swelling.       Left sided pain  Gastrointestinal:  Positive for abdominal pain. Negative for abdominal distention.  Genitourinary: Negative.   Musculoskeletal:  Positive for gait problem.  Skin:  Negative for wound.  Neurological:  Positive for weakness. Negative for dizziness and headaches.  Psychiatric/Behavioral:  Negative for dysphoric mood. The patient is not nervous/anxious.     Immunization History  Administered Date(s) Administered   DTaP / Hep B / IPV 05/04/2017   DTaP / HiB / IPV 03/06/2018   Fluad Quad(high Dose 65+) 06/09/2019, 06/25/2020, 07/25/2021, 05/30/2022   Fluad Trivalent(High Dose 65+) 08/04/2012, 07/03/2014   HIB (PRP-T) 05/04/2017   Hepatitis B, ADULT 03/14/2017, 03/06/2018   IPV 01/28/1998   Influenza, High Dose Seasonal PF 06/16/2018   Influenza,inj,Quad PF,6+ Mos 06/16/2013, 07/03/2014, 06/11/2015, 05/31/2017   Influenza-Unspecified 07/29/2012   PFIZER(Purple Top)SARS-COV-2 Vaccination 10/26/2019, 11/20/2019, 07/22/2020   Pfizer Covid-19 Vaccine Bivalent  Booster 40yrs & up 06/27/2021   Pfizer(Comirnaty)Fall Seasonal Vaccine 12 years and older 07/05/2022   Pneumococcal Conjugate-13 10/15/2013, 05/04/2017, 03/06/2018   Pneumococcal Polysaccharide-23 06/16/2013   Respiratory Syncytial Virus Vaccine,Recomb Aduvanted(Arexvy) 05/30/2022   Td 02/10/1998, 07/03/2014   Pertinent  Health Maintenance Due  Topic Date Due   Colonoscopy  09/24/2023   INFLUENZA VACCINE  06/21/2023 (Originally 04/19/2023)      08/18/2022    9:00 AM 08/30/2022   11:01 AM 09/06/2022   10:00 AM 02/15/2023    1:29 PM 06/11/2023   11:20 AM  Fall Risk  Falls in the past year?    1 0  Was there an injury with Fall?    1 0  Fall Risk Category Calculator    3 0  (RETIRED) Patient Fall Risk Level Low fall risk Low fall risk Low fall risk    Patient at Risk for Falls Due to     History of fall(s)  Fall risk Follow up    Falls evaluation completed Falls evaluation completed   Functional Status  Survey:    Vitals:   06/13/23 1439  BP: (!) 111/57  Pulse: 69  Resp: 18  Temp: 98.8 F (37.1 C)  SpO2: 91%  Weight: 173 lb 9.6 oz (78.7 kg)  Height: 5\' 7"  (1.702 m)   Body mass index is 27.19 kg/m. Physical Exam Vitals reviewed.  Constitutional:      General: He is not in acute distress. HENT:     Head: Normocephalic and atraumatic.  Eyes:     General:        Right eye: No discharge.        Left eye: No discharge.     Extraocular Movements: Extraocular movements intact.     Pupils: Pupils are equal, round, and reactive to light.  Cardiovascular:     Rate and Rhythm: Normal rate and regular rhythm.     Pulses: Normal pulses.     Heart sounds: Normal heart sounds.  Pulmonary:     Effort: Pulmonary effort is normal. No respiratory distress.     Breath sounds: Normal breath sounds. No wheezing.  Chest:       Comments: Increased tenderness to LUQ/ left axillary, no bruising/deformity or skin breakdown present Abdominal:     General: Bowel sounds are normal.      Palpations: Abdomen is soft.  Musculoskeletal:     Cervical back: Neck supple.     Right lower leg: No edema.     Left lower leg: No edema.  Skin:    General: Skin is warm.     Capillary Refill: Capillary refill takes less than 2 seconds.  Neurological:     General: No focal deficit present.     Mental Status: He is alert. Mental status is at baseline.     Motor: Weakness present.     Gait: Gait abnormal.  Psychiatric:        Mood and Affect: Mood normal.     Labs reviewed: Recent Labs    05/20/23 2320 05/22/23 0420 05/23/23 0413 06/06/23 0849  NA 131* 137 134* 138  K 3.7 4.0 3.7 3.9  CL 98 108 101 102  CO2 23 23 25 24   GLUCOSE 120* 93 97 106*  BUN 14 15 21 17   CREATININE 1.00 0.92 0.91 1.00  CALCIUM 8.9 9.2 8.9 9.4  MG 1.9 2.3 2.1  --   PHOS  --  2.3* 2.4*  --    Recent Labs    05/20/23 2320 05/22/23 0420 05/23/23 0413 06/06/23 0849  AST 35 29  --  27  ALT 35 35  --  22  ALKPHOS 79 66  --  80  BILITOT 0.9 0.7  --  0.6  PROT 6.1* 5.7*  --  6.7  ALBUMIN 3.4* 3.0* 2.9* 3.9   Recent Labs    05/20/23 1643 05/20/23 2320 05/22/23 0420 05/23/23 0413 06/06/23 0849  WBC 8.9 9.2 4.3 2.4* 3.4*  NEUTROABS 6.3 6.4  --   --  2.4  HGB 11.0* 9.5* 9.5* 8.9* 9.5*  HCT 33.6* 29.5* 29.8* 28.0* 28.8*  MCV 113.9* 113.0* 116.0* 115.7* 114.7*  PLT 40* 33* 32* 35* 25*   Lab Results  Component Value Date   TSH 0.173 (L) 05/20/2023   Lab Results  Component Value Date   HGBA1C 4.3 (L) 05/06/2023   Lab Results  Component Value Date   CHOL 178 02/13/2023   HDL 42 02/13/2023   LDLCALC 113 02/13/2023   LDLDIRECT 155.8 11/13/2012   TRIG 167 (A) 02/13/2023   CHOLHDL  4 09/06/2017    Significant Diagnostic Results in last 30 days:  CT HEAD WO CONTRAST ( )  Result Date: 05/24/2023 CLINICAL DATA:  Initial evaluation evaluate initial evaluation for trauma, fall. EXAM: CT HEAD WITHOUT CONTRAST TECHNIQUE: Contiguous axial images were obtained from the base of the skull  through the vertex without intravenous contrast. RADIATION DOSE REDUCTION: This exam was performed according to the departmental dose-optimization program which includes automated exposure control, adjustment of the mA and/or kV according to patient size and/or use of iterative reconstruction technique. COMPARISON:  Prior study from 05/21/2023. FINDINGS: Brain: Cerebral volume within normal limits. Moderately advanced chronic microvascular ischemic disease with small remote left cerebellar infarct. No acute intracranial hemorrhage. No acute large vessel territory infarct. No mass lesion or midline shift. No hydrocephalus or extra-axial fluid collection. Vascular: No abnormal hyperdense vessel. Scattered calcified atherosclerosis present at the skull base. Skull: Scalp soft tissues demonstrate no acute finding. Calvarium intact. Innumerable lucency seen throughout the calvarium, consistent with history of multiple myeloma. Sinuses/Orbits: Globes and orbital soft tissues demonstrate no acute finding. Mild mucosal thickening about the ethmoidal air cells. Paranasal sinuses are otherwise clear. No mastoid effusion. Other: None. IMPRESSION: 1. No acute intracranial abnormality. 2. Moderately advanced chronic microvascular ischemic disease with small remote left cerebellar infarct. 3. Innumerable lucency throughout the calvarium, consistent with history of multiple myeloma. Electronically Signed   By: Rise Mu M.D.   On: 05/24/2023 01:56   MR BRAIN WO CONTRAST  Result Date: 05/21/2023 CLINICAL DATA:  Delirium EXAM: MRI HEAD WITHOUT CONTRAST TECHNIQUE: Multiplanar, multiecho pulse sequences of the brain and surrounding structures were obtained without intravenous contrast. COMPARISON:  MRI June 2024. FINDINGS: Brain: No acute infarction, hemorrhage, hydrocephalus, extra-axial collection or mass lesion. Moderate for age T2/FLAIR hyperintensities the white matter, nonspecific but compatible with chronic  microvascular ischemic disease. Cerebral atrophy. Remote cerebellar infarct. Vascular: Major arterial flow voids are maintained at the skull base. Skull and upper cervical spine: Heterogeneous bone marrow. Sinuses/Orbits: Clear sinuses.  No acute orbital findings. Other: No mastoid effusions. IMPRESSION: 1. No acute abnormality. 2. Chronic microvascular ischemic disease and remote cerebellar infarct. 3. Heterogeneous bone marrow, compatible with reported history multiple myeloma. Electronically Signed   By: Feliberto Harts M.D.   On: 05/21/2023 15:59   CT Angio Chest PE W and/or Wo Contrast  Result Date: 05/20/2023 CLINICAL DATA:  Pulmonary embolism (PE) suspected, high prob. Found altered. Responsive to pain. EXAM: CT ANGIOGRAPHY CHEST WITH CONTRAST TECHNIQUE: Multidetector CT imaging of the chest was performed using the standard protocol during bolus administration of intravenous contrast. Multiplanar CT image reconstructions and MIPs were obtained to evaluate the vascular anatomy. RADIATION DOSE REDUCTION: This exam was performed according to the departmental dose-optimization program which includes automated exposure control, adjustment of the mA and/or kV according to patient size and/or use of iterative reconstruction technique. CONTRAST:  75mL OMNIPAQUE IOHEXOL 350 MG/ML SOLN COMPARISON:  05/05/2023 FINDINGS: Cardiovascular: No filling defects in the pulmonary arteries to suggest pulmonary emboli. Heart is enlarged. Coronary artery and aortic atherosclerosis. No aneurysm. Mediastinum/Nodes: No mediastinal, hilar, or axillary adenopathy. Trachea and esophagus are unremarkable. Thyroid unremarkable. Large hiatal hernia. Lungs/Pleura: Elevation and eventration of the right hemidiaphragm. Bibasilar atelectasis. No effusions. Upper Abdomen: No acute findings. Small layering gallstone within the gallbladder. Musculoskeletal: Chest wall soft tissues are unremarkable. Multiple old healing bilateral rib  fractures. Chronic mild T12 compression fracture, stable. Chronic patchy lucencies throughout the osseous structures, unchanged. Review of the MIP images confirms the above findings.  IMPRESSION: No evidence of pulmonary embolus. Cardiomegaly, coronary artery disease. Bibasilar atelectasis. Large hiatal hernia. Stable chronic patchy lucencies throughout the osseous structures likely related to multiple myeloma. No acute bony abnormality. Aortic Atherosclerosis (ICD10-I70.0). Electronically Signed   By: Charlett Nose M.D.   On: 05/20/2023 20:16   CT Head Wo Contrast  Result Date: 05/20/2023 CLINICAL DATA:  Found altered prior to arrival. Mental status change. Responsive to pain. EXAM: CT HEAD WITHOUT CONTRAST TECHNIQUE: Contiguous axial images were obtained from the base of the skull through the vertex without intravenous contrast. RADIATION DOSE REDUCTION: This exam was performed according to the departmental dose-optimization program which includes automated exposure control, adjustment of the mA and/or kV according to patient size and/or use of iterative reconstruction technique. COMPARISON:  CT head 04/20/2023 FINDINGS: Brain: No intracranial hemorrhage, mass effect, or evidence of acute infarct. No hydrocephalus. No extra-axial fluid collection. Age-commensurate cerebral atrophy and ill-defined hypoattenuation within the cerebral white matter consistent with chronic small vessel ischemic disease. Chronic left cerebellar infarct. Vascular: No hyperdense vessel. Intracranial arterial calcification. Skull: No fracture. Multiple lucencies are present throughout the calvarium, similar to prior Sinuses/Orbits: No acute finding. Paranasal sinuses and mastoid air cells are well aerated. Other: None. IMPRESSION: 1. Unchanged CT head from 04/20/2023. No acute intracranial abnormality. Electronically Signed   By: Minerva Fester M.D.   On: 05/20/2023 17:32   DG Chest Portable 1 View  Result Date: 05/20/2023 CLINICAL  DATA:  Hypoxia. Concern for pneumonia. Earlier today was at baseline but found altered prior to arrival. EXAM: PORTABLE CHEST 1 VIEW COMPARISON:  Chest radiographs 05/06/2023, 05/05/2023, 01/11/2023; CT chest, abdomen, and pelvis 05/05/2023 FINDINGS: There is again moderate elevation of the right hemidiaphragm. Cardiac silhouette is at the upper limits of normal size for AP technique. Retrocardiac opacity is seen corresponding to the known large hiatal hernia better seen on prior CT. Mild bibasilar linear subsegmental atelectasis is similar to prior. No pneumothorax. Old healed posterior right rib fractures. IMPRESSION: 1. Mild bibasilar linear subsegmental atelectasis is similar to prior. 2. Large hiatal hernia. Electronically Signed   By: Neita Garnet M.D.   On: 05/20/2023 17:32    Assessment/Plan 1. Rib pain on left side - falls reported 09/06 and 09/19> no apparent injury at time - increased left sided chest/LUQ pain with inspiration/ tenderness palpated - CXR to r/o rib fracture/ PNA - cont tylenol prn - lidocaine 4 %; Place 1 patch onto the skin daily for 14 days. Apply to left side  2. Frequent falls - see above - on rehab unit at this time - denies dizziness, bp stable - cont PT/OT - discussed falls prevention     Family/ staff Communication: plan discussed with patient and nurse  Labs/tests ordered:  CXR

## 2023-06-14 ENCOUNTER — Telehealth: Payer: Self-pay | Admitting: Adult Health

## 2023-06-14 ENCOUNTER — Inpatient Hospital Stay (HOSPITAL_COMMUNITY)
Admission: EM | Admit: 2023-06-14 | Discharge: 2023-06-20 | DRG: 840 | Disposition: A | Discharge status: Skilled Nursing Facility | Payer: Medicare Other | Attending: Internal Medicine | Admitting: Internal Medicine

## 2023-06-14 ENCOUNTER — Emergency Department (HOSPITAL_COMMUNITY): Payer: Medicare Other

## 2023-06-14 ENCOUNTER — Encounter (HOSPITAL_COMMUNITY): Payer: Self-pay | Admitting: Internal Medicine

## 2023-06-14 ENCOUNTER — Other Ambulatory Visit: Payer: Self-pay

## 2023-06-14 DIAGNOSIS — Z882 Allergy status to sulfonamides status: Secondary | ICD-10-CM

## 2023-06-14 DIAGNOSIS — D630 Anemia in neoplastic disease: Secondary | ICD-10-CM | POA: Diagnosis present

## 2023-06-14 DIAGNOSIS — S2242XA Multiple fractures of ribs, left side, initial encounter for closed fracture: Secondary | ICD-10-CM | POA: Diagnosis present

## 2023-06-14 DIAGNOSIS — Z87891 Personal history of nicotine dependence: Secondary | ICD-10-CM | POA: Diagnosis not present

## 2023-06-14 DIAGNOSIS — B370 Candidal stomatitis: Secondary | ICD-10-CM

## 2023-06-14 DIAGNOSIS — Z888 Allergy status to other drugs, medicaments and biological substances status: Secondary | ICD-10-CM

## 2023-06-14 DIAGNOSIS — Z66 Do not resuscitate: Secondary | ICD-10-CM | POA: Diagnosis present

## 2023-06-14 DIAGNOSIS — Z79890 Hormone replacement therapy (postmenopausal): Secondary | ICD-10-CM | POA: Diagnosis not present

## 2023-06-14 DIAGNOSIS — Z823 Family history of stroke: Secondary | ICD-10-CM | POA: Diagnosis not present

## 2023-06-14 DIAGNOSIS — I10 Essential (primary) hypertension: Secondary | ICD-10-CM | POA: Diagnosis present

## 2023-06-14 DIAGNOSIS — Z79899 Other long term (current) drug therapy: Secondary | ICD-10-CM | POA: Diagnosis not present

## 2023-06-14 DIAGNOSIS — T451X5A Adverse effect of antineoplastic and immunosuppressive drugs, initial encounter: Secondary | ICD-10-CM | POA: Diagnosis present

## 2023-06-14 DIAGNOSIS — Z9481 Bone marrow transplant status: Secondary | ICD-10-CM

## 2023-06-14 DIAGNOSIS — D801 Nonfamilial hypogammaglobulinemia: Secondary | ICD-10-CM | POA: Diagnosis not present

## 2023-06-14 DIAGNOSIS — W19XXXA Unspecified fall, initial encounter: Secondary | ICD-10-CM | POA: Diagnosis present

## 2023-06-14 DIAGNOSIS — Z825 Family history of asthma and other chronic lower respiratory diseases: Secondary | ICD-10-CM | POA: Diagnosis not present

## 2023-06-14 DIAGNOSIS — C9000 Multiple myeloma not having achieved remission: Secondary | ICD-10-CM | POA: Diagnosis not present

## 2023-06-14 DIAGNOSIS — R109 Unspecified abdominal pain: Secondary | ICD-10-CM | POA: Diagnosis not present

## 2023-06-14 DIAGNOSIS — Z23 Encounter for immunization: Secondary | ICD-10-CM

## 2023-06-14 DIAGNOSIS — Z8673 Personal history of transient ischemic attack (TIA), and cerebral infarction without residual deficits: Secondary | ICD-10-CM

## 2023-06-14 DIAGNOSIS — D649 Anemia, unspecified: Principal | ICD-10-CM

## 2023-06-14 DIAGNOSIS — D61818 Other pancytopenia: Secondary | ICD-10-CM | POA: Diagnosis not present

## 2023-06-14 DIAGNOSIS — R531 Weakness: Secondary | ICD-10-CM | POA: Diagnosis present

## 2023-06-14 DIAGNOSIS — M1990 Unspecified osteoarthritis, unspecified site: Secondary | ICD-10-CM | POA: Diagnosis present

## 2023-06-14 DIAGNOSIS — E861 Hypovolemia: Secondary | ICD-10-CM | POA: Diagnosis present

## 2023-06-14 DIAGNOSIS — Z800 Family history of malignant neoplasm of digestive organs: Secondary | ICD-10-CM

## 2023-06-14 DIAGNOSIS — S2249XA Multiple fractures of ribs, unspecified side, initial encounter for closed fracture: Secondary | ICD-10-CM | POA: Insufficient documentation

## 2023-06-14 DIAGNOSIS — Z8249 Family history of ischemic heart disease and other diseases of the circulatory system: Secondary | ICD-10-CM

## 2023-06-14 DIAGNOSIS — N390 Urinary tract infection, site not specified: Secondary | ICD-10-CM | POA: Diagnosis not present

## 2023-06-14 DIAGNOSIS — F05 Delirium due to known physiological condition: Secondary | ICD-10-CM | POA: Diagnosis not present

## 2023-06-14 DIAGNOSIS — B3789 Other sites of candidiasis: Secondary | ICD-10-CM | POA: Diagnosis not present

## 2023-06-14 DIAGNOSIS — E785 Hyperlipidemia, unspecified: Secondary | ICD-10-CM | POA: Diagnosis present

## 2023-06-14 DIAGNOSIS — D61810 Antineoplastic chemotherapy induced pancytopenia: Secondary | ICD-10-CM | POA: Diagnosis present

## 2023-06-14 DIAGNOSIS — E039 Hypothyroidism, unspecified: Secondary | ICD-10-CM | POA: Diagnosis present

## 2023-06-14 DIAGNOSIS — Z833 Family history of diabetes mellitus: Secondary | ICD-10-CM

## 2023-06-14 DIAGNOSIS — K219 Gastro-esophageal reflux disease without esophagitis: Secondary | ICD-10-CM | POA: Diagnosis present

## 2023-06-14 DIAGNOSIS — I959 Hypotension, unspecified: Secondary | ICD-10-CM | POA: Diagnosis present

## 2023-06-14 DIAGNOSIS — Z8711 Personal history of peptic ulcer disease: Secondary | ICD-10-CM

## 2023-06-14 DIAGNOSIS — Z808 Family history of malignant neoplasm of other organs or systems: Secondary | ICD-10-CM

## 2023-06-14 DIAGNOSIS — D6959 Other secondary thrombocytopenia: Secondary | ICD-10-CM | POA: Diagnosis present

## 2023-06-14 DIAGNOSIS — R7881 Bacteremia: Secondary | ICD-10-CM | POA: Diagnosis present

## 2023-06-14 DIAGNOSIS — Z87440 Personal history of urinary (tract) infections: Secondary | ICD-10-CM

## 2023-06-14 DIAGNOSIS — D696 Thrombocytopenia, unspecified: Secondary | ICD-10-CM | POA: Diagnosis not present

## 2023-06-14 DIAGNOSIS — B965 Pseudomonas (aeruginosa) (mallei) (pseudomallei) as the cause of diseases classified elsewhere: Secondary | ICD-10-CM | POA: Diagnosis not present

## 2023-06-14 DIAGNOSIS — C9001 Multiple myeloma in remission: Secondary | ICD-10-CM | POA: Diagnosis not present

## 2023-06-14 DIAGNOSIS — S2242XD Multiple fractures of ribs, left side, subsequent encounter for fracture with routine healing: Secondary | ICD-10-CM | POA: Diagnosis not present

## 2023-06-14 DIAGNOSIS — E8809 Other disorders of plasma-protein metabolism, not elsewhere classified: Secondary | ICD-10-CM | POA: Diagnosis present

## 2023-06-14 LAB — CBC WITH DIFFERENTIAL/PLATELET
Abs Immature Granulocytes: 0.17 10*3/uL — ABNORMAL HIGH (ref 0.00–0.07)
Basophils Absolute: 0 10*3/uL (ref 0.0–0.1)
Basophils Relative: 0 %
Eosinophils Absolute: 0.1 10*3/uL (ref 0.0–0.5)
Eosinophils Relative: 2 %
HCT: 20.4 % — ABNORMAL LOW (ref 39.0–52.0)
Hemoglobin: 6.6 g/dL — CL (ref 13.0–17.0)
Immature Granulocytes: 5 %
Lymphocytes Relative: 6 %
Lymphs Abs: 0.2 10*3/uL — ABNORMAL LOW (ref 0.7–4.0)
MCH: 39.3 pg — ABNORMAL HIGH (ref 26.0–34.0)
MCHC: 32.4 g/dL (ref 30.0–36.0)
MCV: 121.4 fL — ABNORMAL HIGH (ref 80.0–100.0)
Monocytes Absolute: 0.7 10*3/uL (ref 0.1–1.0)
Monocytes Relative: 20 %
Neutro Abs: 2.3 10*3/uL (ref 1.7–7.7)
Neutrophils Relative %: 67 %
Platelets: 29 10*3/uL — CL (ref 150–400)
RBC: 1.68 MIL/uL — ABNORMAL LOW (ref 4.22–5.81)
RDW: 19 % — ABNORMAL HIGH (ref 11.5–15.5)
WBC: 3.5 10*3/uL — ABNORMAL LOW (ref 4.0–10.5)
nRBC: 0.6 % — ABNORMAL HIGH (ref 0.0–0.2)

## 2023-06-14 LAB — BPAM RBC
Blood Product Expiration Date: 202410152359
Blood Product Expiration Date: 202410152359
Unit Type and Rh: 6200
Unit Type and Rh: 6200

## 2023-06-14 LAB — COMPREHENSIVE METABOLIC PANEL
ALT: 24 U/L (ref 0–44)
AST: 27 U/L (ref 15–41)
Albumin: 3 g/dL — ABNORMAL LOW (ref 3.5–5.0)
Alkaline Phosphatase: 69 U/L (ref 38–126)
Anion gap: 9 (ref 5–15)
BUN: 23 mg/dL (ref 8–23)
CO2: 23 mmol/L (ref 22–32)
Calcium: 8.5 mg/dL — ABNORMAL LOW (ref 8.9–10.3)
Chloride: 104 mmol/L (ref 98–111)
Creatinine, Ser: 0.96 mg/dL (ref 0.61–1.24)
GFR, Estimated: >60 mL/min (ref >60)
Glucose, Bld: 111 mg/dL — ABNORMAL HIGH (ref 70–99)
Potassium: 3.9 mmol/L (ref 3.5–5.1)
Sodium: 136 mmol/L (ref 135–145)
Total Bilirubin: 0.6 mg/dL (ref 0.3–1.2)
Total Protein: 5.6 g/dL — ABNORMAL LOW (ref 6.5–8.1)

## 2023-06-14 LAB — RETICULOCYTES
Immature Retic Fract: 36.3 % — ABNORMAL HIGH (ref 2.3–15.9)
RBC.: 1.72 MIL/uL — ABNORMAL LOW (ref 4.22–5.81)
Retic Count, Absolute: 104.1 10*3/uL (ref 19.0–186.0)
Retic Ct Pct: 6.1 % — ABNORMAL HIGH (ref 0.4–3.1)

## 2023-06-14 LAB — TYPE AND SCREEN
Unit division: 0
Unit division: 0

## 2023-06-14 LAB — URINALYSIS, W/ REFLEX TO CULTURE (INFECTION SUSPECTED)
Bacteria, UA: NONE SEEN
Bilirubin Urine: NEGATIVE
Glucose, UA: NEGATIVE mg/dL
Hgb urine dipstick: NEGATIVE
Ketones, ur: NEGATIVE mg/dL
Leukocytes,Ua: NEGATIVE
Nitrite: NEGATIVE
Protein, ur: NEGATIVE mg/dL
Specific Gravity, Urine: 1.018 (ref 1.005–1.030)
pH: 5 (ref 5.0–8.0)

## 2023-06-14 LAB — I-STAT CG4 LACTIC ACID, ED
Lactic Acid, Venous: 1.8 mmol/L (ref 0.5–1.9)
Lactic Acid, Venous: 1.2 mmol/L (ref 0.5–1.9)

## 2023-06-14 LAB — PREPARE RBC (CROSSMATCH)

## 2023-06-14 MED ORDER — LACTATED RINGERS IV SOLN: INTRAVENOUS | Status: AC

## 2023-06-14 MED ORDER — PANTOPRAZOLE SODIUM 40 MG PO TBEC
40.0000 mg | DELAYED_RELEASE_TABLET | Freq: Every day | ORAL | Status: DC
  Administered 2023-06-15 – 2023-06-20 (×6): 40 mg via ORAL
  Filled 2023-06-14 (×6): qty 1

## 2023-06-14 MED ORDER — DOCUSATE SODIUM 100 MG PO CAPS: 100.0000 mg | ORAL_CAPSULE | Freq: Every day | ORAL | Status: DC | PRN

## 2023-06-14 MED ORDER — SODIUM CHLORIDE 0.9 % IV SOLN
2.0000 g | Freq: Three times a day (TID) | INTRAVENOUS | Status: AC
  Administered 2023-06-14 – 2023-06-18 (×12): 2 g via INTRAVENOUS
  Filled 2023-06-14 (×13): qty 12.5

## 2023-06-14 MED ORDER — ONDANSETRON HCL 4 MG PO TABS: 4.0000 mg | ORAL_TABLET | Freq: Four times a day (QID) | ORAL | Status: DC | PRN

## 2023-06-14 MED ORDER — DAPSONE 100 MG PO TABS
100.0000 mg | ORAL_TABLET | Freq: Every day | ORAL | Status: DC
  Administered 2023-06-15 – 2023-06-20 (×6): 100 mg via ORAL
  Filled 2023-06-14 (×6): qty 1

## 2023-06-14 MED ORDER — GLUCERNA SHAKE PO LIQD
237.0000 mL | Freq: Three times a day (TID) | ORAL | Status: DC
  Administered 2023-06-14 – 2023-06-20 (×13): 237 mL via ORAL
  Filled 2023-06-14 (×19): qty 237

## 2023-06-14 MED ORDER — SODIUM CHLORIDE 0.9% IV SOLUTION: Freq: Once | INTRAVENOUS | Status: DC

## 2023-06-14 MED ORDER — VANCOMYCIN HCL 1500 MG/300ML IV SOLN
1500.0000 mg | INTRAVENOUS | Status: AC
  Administered 2023-06-15 – 2023-06-18 (×4): 1500 mg via INTRAVENOUS
  Filled 2023-06-14 (×4): qty 300

## 2023-06-14 MED ORDER — LEVOTHYROXINE SODIUM 25 MCG PO TABS
125.0000 ug | ORAL_TABLET | Freq: Every day | ORAL | Status: DC
  Administered 2023-06-16 – 2023-06-20 (×5): 125 ug via ORAL
  Filled 2023-06-14 (×6): qty 1

## 2023-06-14 MED ORDER — ACETAMINOPHEN 650 MG RE SUPP: 650.0000 mg | Freq: Four times a day (QID) | RECTAL | Status: DC | PRN

## 2023-06-14 MED ORDER — ACETAMINOPHEN 325 MG PO TABS
650.0000 mg | ORAL_TABLET | Freq: Four times a day (QID) | ORAL | Status: DC | PRN
  Administered 2023-06-15 – 2023-06-17 (×3): 650 mg via ORAL
  Filled 2023-06-14 (×3): qty 2

## 2023-06-14 MED ORDER — ESCITALOPRAM OXALATE 10 MG PO TABS
10.0000 mg | ORAL_TABLET | Freq: Every day | ORAL | Status: DC
  Administered 2023-06-15 – 2023-06-20 (×6): 10 mg via ORAL
  Filled 2023-06-14 (×6): qty 1

## 2023-06-14 MED ORDER — FERROUS SULFATE 325 (65 FE) MG PO TABS
325.0000 mg | ORAL_TABLET | Freq: Two times a day (BID) | ORAL | Status: DC
  Administered 2023-06-14 – 2023-06-20 (×12): 325 mg via ORAL
  Filled 2023-06-14 (×12): qty 1

## 2023-06-14 MED ORDER — ONDANSETRON HCL 4 MG/2ML IJ SOLN: 4.0000 mg | Freq: Four times a day (QID) | INTRAMUSCULAR | Status: DC | PRN

## 2023-06-14 MED ORDER — GLUCERNA 1.2 CAL PO LIQD
237.0000 mL | Freq: Three times a day (TID) | ORAL | Status: DC
  Filled 2023-06-14: qty 237

## 2023-06-14 MED ORDER — TRAZODONE HCL 50 MG PO TABS
25.0000 mg | ORAL_TABLET | Freq: Every evening | ORAL | Status: DC | PRN
  Administered 2023-06-15 – 2023-06-18 (×4): 25 mg via ORAL
  Filled 2023-06-14 (×4): qty 1

## 2023-06-14 MED ORDER — LACTATED RINGERS IV BOLUS (SEPSIS)
1000.0000 mL | Freq: Once | INTRAVENOUS | Status: AC
  Administered 2023-06-14: 1000 mL via INTRAVENOUS

## 2023-06-14 MED ORDER — VALACYCLOVIR HCL 500 MG PO TABS
500.0000 mg | ORAL_TABLET | Freq: Every day | ORAL | Status: DC
  Administered 2023-06-15 – 2023-06-20 (×6): 500 mg via ORAL
  Filled 2023-06-14 (×6): qty 1

## 2023-06-14 MED ORDER — INFLUENZA VAC A&B SURF ANT ADJ 0.5 ML IM SUSY
0.5000 mL | PREFILLED_SYRINGE | INTRAMUSCULAR | Status: AC
  Administered 2023-06-16: 0.5 mL via INTRAMUSCULAR
  Filled 2023-06-14: qty 0.5

## 2023-06-14 MED ORDER — VANCOMYCIN HCL 1750 MG/350ML IV SOLN
1750.0000 mg | Freq: Once | INTRAVENOUS | Status: AC
  Administered 2023-06-14: 1750 mg via INTRAVENOUS
  Filled 2023-06-14: qty 350

## 2023-06-14 NOTE — H&P (Signed)
History and Physical  Andre [name] ZHY:[id number] DOB: [birth year] DOA: 06/14/2023  PCP: [name], MD   Chief Complaint: Weakness, positive blood culture  HPI: Andre [name] is a 72 y.o. male with medical history significant for multiple myeloma, chronic pancytopenia, recent hospital admission for Pseudomonas UTI being admitted to the hospital with concern for positive blood culture and weakness found to have acute on chronic anemia.  History is provided by the patient as well as his wife who is at the bedside.  He was discharged from [facility] 05/25/2023 after admission with a Pseudomonas urinary tract infection which caused sepsis.  He was discharged from the hospital and completed a course of ciprofloxacin and went to rehab at discharge.  In rehab, he has continued to be quite weak, he has had some low-grade fevers, denies any pain, nausea, in the last couple of days he has been feeling dizzy when trying to ambulate.  Wife states he has had several falls at the facility as he tries to get up without assistance.  I was able to review paper records brought by his wife which showed that he had 1 blood culture 9/25 which resulted with gram-positive cocci in clusters this morning.  Therefore patient was sent to the ER for evaluation.  ED Course: In the emergency department he has been afebrile, blood pressure as low as 87/54.  Saturating well on room air heart rate 72.  He is mentating well.  Lab work shows unremarkable CMP, CBC shows stable leukopenia, stable thrombocytopenia, hemoglobin down to 6.6 from baseline of about 9.  Lactate 1.8.  Patient is currently receiving IV fluid bolus, during my examination blood pressure was recycled and systolic blood pressure now 121.  Review of Systems: Please see HPI for pertinent positives and negatives. A complete 10 system review of systems are otherwise negative.  Past Medical History:  Diagnosis Date   Allergy    Anemia    Arthritis     Asthma    no treatment x 20 years   Depression    Double vision    occurs at times    Duodenal ulcer    GERD (gastroesophageal reflux disease)    Hemorrhage of rectum and anus 09/15/2013   Hyperlipidemia    Hypertension    Hypothyroidism    Leukopenia due to antineoplastic chemotherapy (HCC) 02/22/2017   Multiple myeloma 07/04/2011   Thyroid disease    TIA (transient ischemic attack) 03/27/2012   Transient diplopia 03/27/2012   Transient global amnesia 06/07/2015   Past Surgical History:  Procedure Laterality Date   BONE MARROW TRANSPLANT  2011   for MM   CARDIOLITE STUDY  11/25/2003   NORMAL   TONSILLECTOMY      Social History:  reports that he quit smoking about 54 years ago. His smoking use included cigarettes. He started smoking about 57 years ago. He has never used smokeless tobacco. He reports that he does not drink alcohol and does not use drugs.   Allergies  Allergen Reactions   Atorvastatin Other (See Comments)   Rosuvastatin     Other reaction(s): Liver Disorder   Crestor [Rosuvastatin Calcium]     ADVERSE EFFECTS ON LIVER   Lipitor [Atorvastatin Calcium]    Septra [Sulfamethoxazole-Trimethoprim] Rash    Family History  Problem Relation Age of Onset   Throat cancer Mother    Hypertension Father    Stroke Father    Asthma Father    Diabetes Father  Pancreatic cancer Brother      Prior to Admission medications   Medication Sig Start Date End Date Taking? Authorizing Provider  acetaminophen (TYLENOL) 650 MG CR tablet Take 650 mg by mouth every 6 (six) hours as needed for pain.    [provider]  calcium carbonate (TUMS EX) 750 MG chewable tablet Chew 2 tablets by mouth 3 (three) times daily as needed for heartburn.    [provider]  calcium-vitamin D (OSCAL WITH D) 500-200 MG-UNIT tablet Take 1 tablet by mouth daily. 08/14/22   [name], Georgina Quint, MD  Cholecalciferol (VITAMIN D3) 25 MCG (1000 UT) capsule Take 1,000 Units by  mouth daily.    [provider]  cyanocobalamin (VITAMIN B12) 1000 MCG tablet Take 1 tablet (1,000 mcg total) by mouth daily. 04/09/23   Rana Snare, NP  dapsone 100 MG tablet Take 100 mg by mouth daily.    Ladene Artist, MD  docusate sodium (COLACE) 100 MG capsule Take 100 mg by mouth daily as needed for mild constipation or moderate constipation.    [provider]  escitalopram (LEXAPRO) 10 MG tablet Take 1 tablet (10 mg total) by mouth daily. 06/27/22   [name], Georgina Quint, MD  ferrous sulfate 325 (65 FE) MG EC tablet Take 325 mg by mouth 2 (two) times daily.    [provider]  folic acid (FOLVITE) 1 MG tablet Take 1 tablet (1 mg total) by mouth daily. 04/09/23   Rana Snare, NP  levothyroxine (SYNTHROID) 125 MCG tablet Take 125 mcg by mouth daily. 04/12/23   [provider]  lidocaine 4 % Place 1 patch onto the skin daily for 14 days. Apply to left side 06/13/23 06/27/23  Hazle Nordmann E, NP  methylphenidate (RITALIN) 5 MG tablet Take 0.5 tablets (2.5 mg total) by mouth daily. Each morning 05/11/23 06/10/23  Hughie Closs, MD  pantoprazole (PROTONIX) 40 MG tablet Take 1 tablet (40 mg total) by mouth daily. 04/20/23   Fletcher Anon, NP  valACYclovir (VALTREX) 500 MG tablet Take 500 mg by mouth daily.    [provider]  prochlorperazine (COMPAZINE) 10 MG tablet Take 1 tablet (10 mg total) by mouth every 6 (six) hours as needed (Nausea or vomiting). 12/14/20 12/12/21  Magrinat, Valentino Hue, MD    Physical Exam: BP (!) 87/54   Pulse 72   Temp 98.5 F (36.9 C) (Oral)   Resp 18   SpO2 97% Comment: 4L [place]  General:  Alert, oriented, calm, in no acute distress, looks chronically ill but quite comfortable Eyes: EOMI, clear conjuctivae, white sclerea Neck: supple, no masses, trachea mildline  Cardiovascular: RRR, no murmurs or rubs, no peripheral edema  Respiratory: clear to auscultation bilaterally, no wheezes, no crackles  Abdomen: soft, nontender,  nondistended, normal bowel tones heard  Skin: dry, no rashes  Musculoskeletal: no joint effusions, normal range of motion  Psychiatric: appropriate affect, normal speech  Neurologic: extraocular muscles intact, clear speech, moving all extremities with intact sensorium         Labs on Admission:  Basic Metabolic Panel: Recent Labs  Lab 06/14/23 1246  NA 136  K 3.9  CL 104  CO2 23  GLUCOSE 111*  BUN 23  CREATININE 0.96  CALCIUM 8.5*   Liver Function Tests: Recent Labs  Lab 06/14/23 1246  AST 27  ALT 24  ALKPHOS 69  BILITOT 0.6  PROT 5.6*  ALBUMIN 3.0*   No results for input(s): "LIPASE", "AMYLASE" in the last  168 hours. No results for input(s): "AMMONIA" in the last 168 hours. CBC: Recent Labs  Lab 06/14/23 1246  WBC 3.5*  NEUTROABS 2.3  HGB 6.6*  HCT 20.4*  MCV 121.4*  PLT 29*   Cardiac Enzymes: No results for input(s): "CKTOTAL", "CKMB", "CKMBINDEX", "TROPONINI" in the last 168 hours.  BNP (last 3 results) Recent Labs    05/05/23 1744  BNP 116.7*    ProBNP (last 3 results) No results for input(s): "PROBNP" in the last 8760 hours.  CBG: No results for input(s): "GLUCAP" in the last 168 hours.  Radiological Exams on Admission: No results found.  Assessment/Plan Andre [name] is a 72 y.o. male with medical history significant for multiple myeloma, chronic pancytopenia, recent hospital admission for Pseudomonas UTI being admitted to the hospital with concern for positive blood culture and weakness found to have acute on chronic anemia.   Acute on chronic anemia-without clear evidence of acute blood loss, however patient is hypotensive. -Inpatient admission to progressive -Avoid blood thinners -Check stool guaiac -Agree with transfuse 2 units PRBC  Pancytopenia-likely due to his chemotherapy for multiple myeloma -Dr. Truett Perna aware of his admission, will follow along  Positive blood culture-only 1 culture was obtained yesterday at his  facility, this could be contaminant, but can currently not rule out a true bacteremia -Repeat peripheral blood culture x 2 -Due to high risk as well as hypotension, will treat empirically with IV vancomycin/cefepime  Hypotension-unclear whether this is related to blood loss, possible early signs of sepsis, or perhaps related to poor oral intake.  However he is responding to IV fluids with improving blood pressure.  Currently not meeting SIRS criteria.  GERD-Protonix  Hypothyroidism-Synthroid  DVT prophylaxis: SCDs only due to thrombocytopenia    Code Status: Limited: Do not attempt resuscitation (DNR) -DNR-LIMITED -Do Not Intubate/DNI -confirmed at the time of admission at the bedside with the patient and his wife  Consults called: Oncology Dr. Alcide Evener  Admission status: The appropriate patient status for this patient is INPATIENT. Inpatient status is judged to be reasonable and necessary in order to provide the required intensity of service to ensure the patient's safety. [name] presenting symptoms, physical exam findings, and initial radiographic and laboratory data in the context of their chronic comorbidities is felt to place them at high risk for further clinical deterioration. Furthermore, it is not anticipated that the patient will be medically stable for discharge from the hospital within 2 midnights of admission.    I certify that at the point of admission it is my clinical judgment that the patient will require inpatient hospital care spanning beyond 2 midnights from the point of admission due to high intensity of service, high risk for further deterioration and high frequency of surveillance required  Time spent: 56 minutes  Adyline Huberty Sharlette Dense MD [facility] Pager [phone]  If 7PM-7AM, please contact night-coverage [email or web address] Password [email or web address]  06/14/2023, 2:26 PM

## 2023-06-14 NOTE — ED Notes (Signed)
ED TO INPATIENT HANDOFF REPORT  Name/Age/Gender Andre [name] 72 y.o. male  Code Status    Code Status Orders  (From admission, onward)           Start     Ordered   06/14/23 1405  Do not attempt resuscitation (DNR)- Limited -Do Not Intubate (DNI)  Continuous       Question Answer Comment  If pulseless and not breathing No CPR or chest compressions.   In Pre-Arrest Conditions (Patient Is Breathing and Has A Pulse) Do not intubate. Provide all appropriate non-invasive medical interventions. Avoid ICU transfer unless indicated or required.   Consent: Discussion documented in EHR or advanced directives reviewed      06/14/23 1405           Code Status History     Date Active Date Inactive Code Status Order ID Comments User Context   05/20/2023 2130 05/25/2023 1614 Limited: Do not attempt resuscitation (DNR) -DNR-LIMITED -Do Not Intubate/DNI  [id number]  Carollee Herter, DO ED   05/20/2023 2045 05/20/2023 2130 Limited: Do not attempt resuscitation (DNR) -DNR-LIMITED -Do Not Intubate/DNI  [id number]  Carollee Herter, DO ED   05/05/2023 2318 05/11/2023 1834 DNR [id number]  Tomma Lightning, MD ED   05/05/2023 1715 05/05/2023 2318 DNR [id number]  Glyn Ade, MD ED   02/26/2016 2354 03/01/2016 1547 Full Code [id number]  Therisa Doyne, MD Inpatient   12/23/2015 0834 12/26/2015 1754 Full Code [id number]  Magrinat, Valentino Hue, MD Inpatient   12/22/2015 2232 12/23/2015 0833 DNR [id number]  Therisa Doyne, MD Inpatient   11/07/2015 1903 11/09/2015 1802 Full Code [id number]  Briscoe Deutscher, MD Inpatient      Advance Directive Documentation    Flowsheet Row Most Recent Value  Type of Advance Directive Out of facility DNR (pink MOST or yellow form)  Pre-existing out of facility DNR order (yellow form or pink MOST form) --  "MOST" Form in Place? --       Home/SNF/Other Rehab  Chief Complaint Anemia associated with multiple myeloma treated with erythropoietin (HCC) [C90.00, D63.0]  Level of  Care/Admitting Diagnosis ED Disposition     ED Disposition  Admit   Condition  --   Comment  Hospital Area: [facility] [[id number]]  Level of Care: Progressive [102]  Admit to Progressive based on following criteria: MULTISYSTEM THREATS such as stable sepsis, metabolic/electrolyte imbalance with or without encephalopathy that is responding to early treatment.  May admit patient to [facility] or [name] if equivalent level of care is available:: Yes  Covid Evaluation: Asymptomatic - no recent exposure (last 10 days) testing not required  Diagnosis: Anemia associated with multiple myeloma treated with erythropoietin [facility]) [[id number]]  Admitting Physician: Maryln Gottron [[id number]]  Attending Physician: Kirby Crigler, MIR [email or web address] [[id number]]  Certification:: I certify this patient will need inpatient services for at least 2 midnights          Medical History Past Medical History:  Diagnosis Date   Allergy    Anemia    Arthritis    Asthma    no treatment x 20 years   Depression    Double vision    occurs at times    Duodenal ulcer    GERD (gastroesophageal reflux disease)    Hemorrhage of rectum and anus 09/15/2013   Hyperlipidemia    Hypertension    Hypothyroidism    Leukopenia due to antineoplastic chemotherapy (HCC) 02/22/2017   Multiple myeloma 07/04/2011   Thyroid disease  TIA (transient ischemic attack) 03/27/2012   Transient diplopia 03/27/2012   Transient global amnesia 06/07/2015    Allergies Allergies  Allergen Reactions   Atorvastatin Other (See Comments)   Rosuvastatin     Other reaction(s): Liver Disorder   Crestor [Rosuvastatin Calcium]     ADVERSE EFFECTS ON LIVER   Lipitor [Atorvastatin Calcium]    Septra [Sulfamethoxazole-Trimethoprim] Rash    IV Location/Drains/Wounds Patient Lines/Drains/Airways Status     Active Line/Drains/Airways     Name Placement date Placement time Site Days   Peripheral IV 06/14/23 22 G 1"  Left;Posterior Hand 06/14/23  1205  Hand  less than 1   Peripheral IV 06/14/23 22 G 1" Anterior;Distal;Right Forearm 06/14/23  1311  Forearm  less than 1            Labs/Imaging Results for orders placed or performed during the hospital encounter of 06/14/23 (from the past 48 hour(s))  Comprehensive metabolic panel     Status: Abnormal   Collection Time: 06/14/23 12:46 PM  Result Value Ref Range   Sodium 136 135 - 145 mmol/L   Potassium 3.9 3.5 - 5.1 mmol/L   Chloride 104 98 - 111 mmol/L   CO2 23 22 - 32 mmol/L   Glucose, Bld 111 (H) 70 - 99 mg/dL    Comment: Glucose reference range applies only to samples taken after fasting for at least 8 hours.   BUN 23 8 - 23 mg/dL   Creatinine, Ser 1.61 0.61 - 1.24 mg/dL   Calcium 8.5 (L) 8.9 - 10.3 mg/dL   Total Protein 5.6 (L) 6.5 - 8.1 g/dL   Albumin 3.0 (L) 3.5 - 5.0 g/dL   AST 27 15 - 41 U/L   ALT 24 0 - 44 U/L   Alkaline Phosphatase 69 38 - 126 U/L   Total Bilirubin 0.6 0.3 - 1.2 mg/dL   GFR, Estimated >09 >60 mL/min    Comment: (NOTE) Calculated using the CKD-EPI Creatinine Equation (2021)    Anion gap 9 5 - 15    Comment: Performed at [facility], [address], [place], [place] [address]  CBC with Differential     Status: Abnormal   Collection Time: 06/14/23 12:46 PM  Result Value Ref Range   WBC 3.5 (L) 4.0 - 10.5 K/uL   RBC 1.68 (L) 4.22 - 5.81 MIL/uL   Hemoglobin 6.6 (LL) 13.0 - 17.0 g/dL    Comment: REPEATED TO VERIFY THIS CRITICAL RESULT HAS VERIFIED AND BEEN CALLED TO RN M LEWIS BY ALEXIS CRUICKSHANK ON 09 26 2024 AT 1332, AND HAS BEEN READ BACK.     HCT 20.4 (L) 39.0 - 52.0 %   MCV 121.4 (H) 80.0 - 100.0 fL   MCH 39.3 (H) 26.0 - 34.0 pg   MCHC 32.4 30.0 - 36.0 g/dL   RDW 81.1 (H) 91.4 - 78.2 %   Platelets 29 (LL) 150 - 400 K/uL    Comment: SPECIMEN CHECKED FOR CLOTS Immature Platelet Fraction may be clinically indicated, consider ordering this additional test [id number] CONSISTENT WITH  PREVIOUS RESULT REPEATED TO VERIFY    nRBC 0.6 (H) 0.0 - 0.2 %   Neutrophils Relative % 67 %   Neutro Abs 2.3 1.7 - 7.7 K/uL   Lymphocytes Relative 6 %   Lymphs Abs 0.2 (L) 0.7 - 4.0 K/uL   Monocytes Relative 20 %   Monocytes Absolute 0.7 0.1 - 1.0 K/uL   Eosinophils Relative 2 %   Eosinophils Absolute 0.1 0.0 -  0.5 K/uL   Basophils Relative 0 %   Basophils Absolute 0.0 0.0 - 0.1 K/uL   Immature Granulocytes 5 %   Abs Immature Granulocytes 0.17 (H) 0.00 - 0.07 K/uL   Tear Drop Cells PRESENT    Polychromasia PRESENT     Comment: Performed at [facility], [address] [place], [place] [address]  I-Stat Lactic Acid, ED     Status: None   Collection Time: 06/14/23  1:15 PM  Result Value Ref Range   Lactic Acid, Venous 1.8 0.5 - 1.9 mmol/L  Type and screen [facility]     Status: None (Preliminary result)   Collection Time: 06/14/23  1:52 PM  Result Value Ref Range   ABO/RH(D) PENDING    Antibody Screen PENDING    Sample Expiration      06/17/2023,2359 Performed at [facility], [address] [place], [place] [address]   Prepare RBC (crossmatch)     Status: None   Collection Time: 06/14/23  1:52 PM  Result Value Ref Range   Order Confirmation      ORDER PROCESSED BY BLOOD BANK Performed at [facility], [address], [place], [place] [address]   I-Stat Lactic Acid, ED     Status: None   Collection Time: 06/14/23  3:30 PM  Result Value Ref Range   Lactic Acid, Venous 1.2 0.5 - 1.9 mmol/L   *Note: Due to a large number of results and/or encounters for the requested time period, some results have not been displayed. A complete set of results can be found in Results Review.   DG Ribs Unilateral W/Chest Left  Result Date: 06/14/2023 CLINICAL DATA:  Left rib pain after fall. EXAM: LEFT RIBS AND CHEST - 3+ VIEW COMPARISON:  May 20, 2023. FINDINGS: Mildly displaced acute fractures are seen  involving the left sixth and seventh ribs laterally. Subacute to old left eighth rib fracture is noted. There is no evidence of pneumothorax or pleural effusion. Both lungs are clear. Heart size and mediastinal contours are within normal limits. IMPRESSION: Mildly displaced left sixth and seventh rib fractures. Electronically Signed   By: Lupita Raider M.D.   On: 06/14/2023 15:54    Pending Labs Unresulted Labs (From admission, onward)     Start     Ordered   06/15/23 0500  Basic metabolic panel  Tomorrow morning,   R        06/14/23 1405   06/15/23 0500  CBC  Tomorrow morning,   R        06/14/23 1405   06/14/23 1236  Blood Culture (routine x 2)  (Undifferentiated presentation (screening labs and basic nursing orders))  BLOOD CULTURE X 2,   STAT      06/14/23 1236   06/14/23 1236  Urinalysis, w/ Reflex to Culture (Infection Suspected) -Urine, Clean Catch  (Undifferentiated presentation (screening labs and basic nursing orders))  ONCE - URGENT,   URGENT       Question:  Specimen Source  Answer:  Urine, Clean Catch   06/14/23 1236            Vitals/Pain Today's Vitals   06/14/23 1230 06/14/23 1245 06/14/23 1300 06/14/23 1313  BP: (!) 105/56 (!) 97/48 (!) 87/54   Pulse: 75 74 72   Resp:  17 18   Temp:      TempSrc:      SpO2: 94% 97% 97% 97%    Isolation Precautions  No active isolations  Medications Medications  lactated ringers infusion ( Intravenous New Bag/Given 06/14/23 1243)  0.9 %  sodium chloride infusion (Manually program via Guardrails IV Fluids) (has no administration in time range)  dapsone tablet 100 mg (has no administration in time range)  valACYclovir (VALTREX) tablet 500 mg (has no administration in time range)  escitalopram (LEXAPRO) tablet 10 mg (has no administration in time range)  levothyroxine (SYNTHROID) tablet 125 mcg (has no administration in time range)  pantoprazole (PROTONIX) EC tablet 40 mg (has no administration in time range)  docusate sodium  (COLACE) capsule 100 mg (has no administration in time range)  ferrous sulfate tablet 325 mg (has no administration in time range)  acetaminophen (TYLENOL) tablet 650 mg (has no administration in time range)    Or  acetaminophen (TYLENOL) suppository 650 mg (has no administration in time range)  traZODone (DESYREL) tablet 25 mg (has no administration in time range)  ondansetron (ZOFRAN) tablet 4 mg (has no administration in time range)    Or  ondansetron (ZOFRAN) injection 4 mg (has no administration in time range)  vancomycin (VANCOREADY) IVPB 1750 mg/350 mL (1,750 mg Intravenous New Bag/Given 06/14/23 1555)  ceFEPIme (MAXIPIME) 2 g in sodium chloride 0.9 % 100 mL IVPB (0 g Intravenous Stopped 06/14/23 1555)  vancomycin (VANCOREADY) IVPB 1500 mg/300 mL (has no administration in time range)  feeding supplement (GLUCERNA SHAKE) (GLUCERNA SHAKE) liquid 237 mL (has no administration in time range)  lactated ringers bolus 1,000 mL (1,000 mLs Intravenous New Bag/Given 06/14/23 1245)    Mobility walks with device

## 2023-06-14 NOTE — Telephone Encounter (Signed)
Lab called to report critical result. 1 blood culture growing gram positive cocci in clusters.  Also pt is having flank pain, falls, weakness, and is on 4 liters of oxygen not able to taper.  Will send to ED.

## 2023-06-14 NOTE — ED Triage Notes (Signed)
Pt BIBA from [facility] with c/o confirmed UTI. Has been taking ABX since 9/20, increased weakness since taking ABX. A&O4. 22G L hand  96/60 HR 92 RR 16 93% 4L [place] baseline  CBG 141

## 2023-06-14 NOTE — Progress Notes (Signed)
Pharmacy Antibiotic Note  Andre [name] is a 72 y.o. male admitted on 06/14/2023 with bacteremia.  Pharmacy has been consulted for vanc/cefepime dosing.  Plan: Vanc 1750mg  IV x 1 thren 1500mg  IV q24 - goal AUC 400-550 Cefepime 2g IV q8 Follow up culture results and narrow prn     Temp (24hrs), Avg:98.5 F (36.9 C), Min:98.5 F (36.9 C), Max:98.5 F (36.9 C)  Recent Labs  Lab 06/14/23 1246 06/14/23 1315  WBC 3.5*  --   CREATININE 0.96  --   LATICACIDVEN  --  1.8    Estimated Creatinine Clearance: 63.1 mL/min (by C-G formula based on SCr of 0.96 mg/dL).    Allergies  Allergen Reactions   Atorvastatin Other (See Comments)   Rosuvastatin     Other reaction(s): Liver Disorder   Crestor [Rosuvastatin Calcium]     ADVERSE EFFECTS ON LIVER   Lipitor [Atorvastatin Calcium]    Septra [Sulfamethoxazole-Trimethoprim] Rash     Thank you for allowing pharmacy to be a part of this patient's care.  Berkley Harvey 06/14/2023 2:48 PM

## 2023-06-14 NOTE — ED Provider Notes (Signed)
[place] EMERGENCY DEPARTMENT AT [facility] Provider Note   CSN: [id number] Arrival date & time: 06/14/23  1208     History  Chief Complaint  Patient presents with   UTI    Andre [name] is a 72 y.o. male.  HPI   Patient has history of hypertension hyperlipidemia and duodenal ulcer acid reflux depression hypothyroidism multiple myeloma TIA who presents to the ED for evaluation of weakness.  Patient is currently residing on the rehab unit of [facility].  Patient was in the hospital earlier this month for delirium.  Patient also has had issues with recent falls in the last couple of weeks.  Patient states he had an episode today where he felt weak again and started to fall patient states he is not sure that anyone was able to catch him.  He denies any acute injuries.  He has been having pain in the left rib area but this preceded the fall today.  Patient states he has been feeling weak all over contributing to his fall today.  He was also recently diagnosed with UTI.  He denies any fevers or chills.  No vomiting or diarrhea.  Prior records reveal that back in August of this year he had Pseudomonas bacteremia.  Patient also had Pseudomonas UTI earlier this month with his hospitalization.  He also had acute metabolic encephalopathy  Home Medications Prior to Admission medications   Medication Sig Start Date End Date Taking? Authorizing Provider  acetaminophen (TYLENOL) 650 MG CR tablet Take 650 mg by mouth every 6 (six) hours as needed for pain.    [provider]  calcium carbonate (TUMS EX) 750 MG chewable tablet Chew 2 tablets by mouth 3 (three) times daily as needed for heartburn.    [provider]  calcium-vitamin D (OSCAL WITH D) 500-200 MG-UNIT tablet Take 1 tablet by mouth daily. 08/14/22   [name], Georgina Quint, MD  Cholecalciferol (VITAMIN D3) 25 MCG (1000 UT) capsule Take 1,000 Units by mouth daily.    [provider]   cyanocobalamin (VITAMIN B12) 1000 MCG tablet Take 1 tablet (1,000 mcg total) by mouth daily. 04/09/23   Rana Snare, NP  dapsone 100 MG tablet Take 100 mg by mouth daily.    Ladene Artist, MD  docusate sodium (COLACE) 100 MG capsule Take 100 mg by mouth daily as needed for mild constipation or moderate constipation.    [provider]  escitalopram (LEXAPRO) 10 MG tablet Take 1 tablet (10 mg total) by mouth daily. 06/27/22   [name], Georgina Quint, MD  ferrous sulfate 325 (65 FE) MG EC tablet Take 325 mg by mouth 2 (two) times daily.    [provider]  folic acid (FOLVITE) 1 MG tablet Take 1 tablet (1 mg total) by mouth daily. 04/09/23   Rana Snare, NP  levothyroxine (SYNTHROID) 125 MCG tablet Take 125 mcg by mouth daily. 04/12/23   [provider]  lidocaine 4 % Place 1 patch onto the skin daily for 14 days. Apply to left side 06/13/23 06/27/23  Hazle Nordmann E, NP  methylphenidate (RITALIN) 5 MG tablet Take 0.5 tablets (2.5 mg total) by mouth daily. Each morning 05/11/23 06/10/23  Hughie Closs, MD  pantoprazole (PROTONIX) 40 MG tablet Take 1 tablet (40 mg total) by mouth daily. 04/20/23   Fletcher Anon, NP  valACYclovir (VALTREX) 500 MG tablet Take 500 mg by mouth daily.    [provider]  prochlorperazine (COMPAZINE) 10 MG  tablet Take 1 tablet (10 mg total) by mouth every 6 (six) hours as needed (Nausea or vomiting). 12/14/20 12/12/21  Magrinat, Valentino Hue, MD      Allergies    Atorvastatin, Rosuvastatin, Crestor [rosuvastatin calcium], Lipitor [atorvastatin calcium], and Septra [sulfamethoxazole-trimethoprim]    Review of Systems   Review of Systems  Physical Exam Updated Vital Signs BP (!) 87/54   Pulse 72   Temp 98.5 F (36.9 C) (Oral)   Resp 18   SpO2 97% Comment: 4L [place] Physical Exam Vitals and nursing note reviewed.  Constitutional:      Appearance: He is well-developed. He is not diaphoretic.  HENT:     Head: Normocephalic and atraumatic.      Right Ear: External ear normal.     Left Ear: External ear normal.  Eyes:     General: No scleral icterus.       Right eye: No discharge.        Left eye: No discharge.     Conjunctiva/sclera: Conjunctivae normal.  Neck:     Trachea: No tracheal deviation.  Cardiovascular:     Rate and Rhythm: Normal rate and regular rhythm.  Pulmonary:     Effort: Pulmonary effort is normal. No respiratory distress.     Breath sounds: Normal breath sounds. No stridor. No wheezing or rales.  Chest:     Comments: Tenderness palpation left chest wall Abdominal:     General: Bowel sounds are normal. There is no distension.     Palpations: Abdomen is soft.     Tenderness: There is no abdominal tenderness. There is no guarding or rebound.  Musculoskeletal:        General: No tenderness or deformity.     Cervical back: Neck supple.     Comments: No cervical thoracic or lumbar spine tenderness, no tenderness palpation bilateral upper or lower extremities  Skin:    General: Skin is warm and dry.     Findings: No rash.  Neurological:     General: No focal deficit present.     Mental Status: He is alert.     Cranial Nerves: No cranial nerve deficit, dysarthria or facial asymmetry.     Sensory: No sensory deficit.     Motor: No abnormal muscle tone or seizure activity.     Coordination: Coordination normal.     Comments: Equal grip strength, equal plantarflexion strength bilaterally  Psychiatric:        Mood and Affect: Mood normal.     ED Results / Procedures / Treatments   Labs (all labs ordered are listed, but only abnormal results are displayed) Labs Reviewed  COMPREHENSIVE METABOLIC PANEL - Abnormal; Notable for the following components:      Result Value   Glucose, Bld 111 (*)    Calcium 8.5 (*)    Total Protein 5.6 (*)    Albumin 3.0 (*)    All other components within normal limits  CBC WITH DIFFERENTIAL/PLATELET - Abnormal; Notable for the following components:   WBC 3.5 (*)     RBC 1.68 (*)    Hemoglobin 6.6 (*)    HCT 20.4 (*)    MCV 121.4 (*)    MCH 39.3 (*)    RDW 19.0 (*)    Platelets 29 (*)    nRBC 0.6 (*)    Lymphs Abs 0.2 (*)    Abs Immature Granulocytes 0.17 (*)    All other components within normal limits  CULTURE, BLOOD (ROUTINE X 2)  CULTURE, BLOOD (ROUTINE X 2)  URINALYSIS, W/ REFLEX TO CULTURE (INFECTION SUSPECTED)  I-STAT CG4 LACTIC ACID, ED  POC OCCULT BLOOD, ED  I-STAT CG4 LACTIC ACID, ED  TYPE AND SCREEN  PREPARE RBC (CROSSMATCH)    EKG EKG Interpretation Date/Time:  Thursday June 14 2023 13:03:18 EDT Ventricular Rate:  73 PR Interval:  174 QRS Duration:  96 QT Interval:  418 QTC Calculation: 461 R Axis:   -11  Text Interpretation: Sinus rhythm Confirmed by Linwood Dibbles [phone]) on 06/14/2023 1:12:35 PM  Radiology No results found.  Procedures .Critical Care  Performed by: Linwood Dibbles, MD Authorized by: Linwood Dibbles, MD   Critical care provider statement:    Critical care time (minutes):  35   Critical care was time spent personally by me on the following activities:  Development of treatment plan with patient or surrogate, discussions with consultants, evaluation of patient's response to treatment, examination of patient, ordering and review of laboratory studies, ordering and review of radiographic studies, ordering and performing treatments and interventions, pulse oximetry, re-evaluation of patient's condition and review of old charts     Medications Ordered in ED Medications  lactated ringers infusion ( Intravenous New Bag/Given 06/14/23 1243)  0.9 %  sodium chloride infusion (Manually program via Guardrails IV Fluids) (has no administration in time range)  dapsone tablet 100 mg (has no administration in time range)  valACYclovir (VALTREX) tablet 500 mg (has no administration in time range)  escitalopram (LEXAPRO) tablet 10 mg (has no administration in time range)  levothyroxine (SYNTHROID) tablet 125 mcg (has no  administration in time range)  pantoprazole (PROTONIX) EC tablet 40 mg (has no administration in time range)  docusate sodium (COLACE) capsule 100 mg (has no administration in time range)  ferrous sulfate EC tablet 325 mg (has no administration in time range)  acetaminophen (TYLENOL) tablet 650 mg (has no administration in time range)    Or  acetaminophen (TYLENOL) suppository 650 mg (has no administration in time range)  traZODone (DESYREL) tablet 25 mg (has no administration in time range)  ondansetron (ZOFRAN) tablet 4 mg (has no administration in time range)    Or  ondansetron (ZOFRAN) injection 4 mg (has no administration in time range)  lactated ringers bolus 1,000 mL (1,000 mLs Intravenous New Bag/Given 06/14/23 1245)    ED Course/ Medical Decision Making/ A&P Clinical Course as of 06/14/23 1409  Thu Jun 14, 2023  1337 Labs reviewed.  Hemoglobin decreased at 6.6.  Platelets decreased at 29 [JK]  1340 Hgb decreased since last.  Wbc and platelets low but similar to previous  [JK]  1341 Blood pressure low.  Lactic acid normal.  No findings to suggest infection at this time.  Suspect the hypotension could be related to his anemia.  Will hold off on fluid bolus and proceed with blood transfusion [JK]  1352 Discussed findings with patient.  He denies any blood in his stools.  Nursing home notes indicate occult blood positive stools.  Will repeat in the ED [JK]  1404 Discussed with Dr. Erenest Blank regarding admission.  I will also consult with hematology as requested considering the patient's pancytopenia [JK]  1409 Case discussed with Dr. Al Pimple.  She will inform Dr. Truett Perna that the patient is admitted to hospital [JK]    Clinical Course User Index [JK] Linwood Dibbles, MD  Medical Decision Making Problems Addressed: Acute anemia: acute illness or injury that poses a threat to life or bodily functions Hypotension, unspecified hypotension type: acute illness or  injury that poses a threat to life or bodily functions Weakness: acute illness or injury that poses a threat to life or bodily functions  Amount and/or Complexity of Data Reviewed Labs: ordered. Decision-making details documented in ED Course. Radiology: ordered and independent interpretation performed. ECG/medicine tests: ordered.  Risk Prescription drug management. Decision regarding hospitalization.   Patient presented to the ED from [facility] for weakness.  Patient has history of multiple medical problems including recent UTIs, pancytopenia and anemia.  Patient noted to have low blood pressures in the ED although maps greater than 60.  Consider the possibility of infection versus blood loss anemia.  Patient recently diagnosed with UTI has been in the hospital for sepsis.  At this time lactic acid level is normal.  Infection is still a consideration no obvious source at this time.  Will send a urine and blood cultures have been sent.  Patient blood test also shows a hemoglobin now of 6.6.  This is decreased from 9 recently.  Patient denies blood in his stools but nursing home notes indicate recent Hemoccult positive stools.  Patient does have chronic anemia but there may be a component of acute on chronic blood loss versus anemia related to his pancytopenia.  With his soft blood pressures we will proceed with blood transfusions.  I will consult with the medical service for admission and further treatment.        Final Clinical Impression(s) / ED Diagnoses Final diagnoses:  Weakness  Acute anemia  Hypotension, unspecified hypotension type    Rx / DC Orders ED Discharge Orders     None         Linwood Dibbles, MD 06/14/23 1409

## 2023-06-15 DIAGNOSIS — D61818 Other pancytopenia: Secondary | ICD-10-CM | POA: Diagnosis not present

## 2023-06-15 DIAGNOSIS — R7881 Bacteremia: Secondary | ICD-10-CM | POA: Diagnosis present

## 2023-06-15 DIAGNOSIS — E039 Hypothyroidism, unspecified: Secondary | ICD-10-CM

## 2023-06-15 DIAGNOSIS — I959 Hypotension, unspecified: Secondary | ICD-10-CM | POA: Diagnosis present

## 2023-06-15 DIAGNOSIS — C9000 Multiple myeloma not having achieved remission: Secondary | ICD-10-CM | POA: Diagnosis not present

## 2023-06-15 DIAGNOSIS — K219 Gastro-esophageal reflux disease without esophagitis: Secondary | ICD-10-CM

## 2023-06-15 DIAGNOSIS — D630 Anemia in neoplastic disease: Secondary | ICD-10-CM | POA: Diagnosis not present

## 2023-06-15 LAB — CBC
HCT: 21.2 % — ABNORMAL LOW (ref 39.0–52.0)
Hemoglobin: 6.7 g/dL — CL (ref 13.0–17.0)
MCH: 38.7 pg — ABNORMAL HIGH (ref 26.0–34.0)
MCHC: 31.6 g/dL (ref 30.0–36.0)
MCV: 122.5 fL — ABNORMAL HIGH (ref 80.0–100.0)
Platelets: 26 10*3/uL — CL (ref 150–400)
RBC: 1.73 MIL/uL — ABNORMAL LOW (ref 4.22–5.81)
RDW: 18.6 % — ABNORMAL HIGH (ref 11.5–15.5)
WBC: 2.7 10*3/uL — ABNORMAL LOW (ref 4.0–10.5)
nRBC: 0 % (ref 0.0–0.2)

## 2023-06-15 LAB — BASIC METABOLIC PANEL
Anion gap: 5 (ref 5–15)
BUN: 18 mg/dL (ref 8–23)
CO2: 26 mmol/L (ref 22–32)
Calcium: 8.6 mg/dL — ABNORMAL LOW (ref 8.9–10.3)
Chloride: 106 mmol/L (ref 98–111)
Creatinine, Ser: 0.89 mg/dL (ref 0.61–1.24)
GFR, Estimated: >60 mL/min (ref >60)
Glucose, Bld: 95 mg/dL (ref 70–99)
Potassium: 4.5 mmol/L (ref 3.5–5.1)
Sodium: 137 mmol/L (ref 135–145)

## 2023-06-15 LAB — DIRECT ANTIGLOBULIN TEST (NOT AT ARMC)
DAT, IgG: NEGATIVE
DAT, complement: NEGATIVE

## 2023-06-15 LAB — HEMOGLOBIN AND HEMATOCRIT, BLOOD
HCT: 32.7 % — ABNORMAL LOW (ref 39.0–52.0)
Hemoglobin: 11 g/dL — ABNORMAL LOW (ref 13.0–17.0)

## 2023-06-15 LAB — PREPARE RBC (CROSSMATCH)

## 2023-06-15 MED ORDER — SODIUM CHLORIDE 0.9% IV SOLUTION: Freq: Once | INTRAVENOUS | Status: DC

## 2023-06-15 MED ORDER — QUETIAPINE FUMARATE 25 MG PO TABS: 25.0000 mg | ORAL_TABLET | Freq: Every evening | ORAL | Status: DC | PRN

## 2023-06-15 MED ORDER — FUROSEMIDE 10 MG/ML IJ SOLN
20.0000 mg | Freq: Once | INTRAMUSCULAR | Status: AC
  Administered 2023-06-15: 20 mg via INTRAVENOUS
  Filled 2023-06-15: qty 2

## 2023-06-15 MED ORDER — MUSCLE RUB 10-15 % EX CREA
TOPICAL_CREAM | CUTANEOUS | Status: DC | PRN
  Filled 2023-06-15: qty 85

## 2023-06-15 MED ORDER — CALCIUM CARBONATE ANTACID 500 MG PO CHEW
1.0000 | CHEWABLE_TABLET | Freq: Two times a day (BID) | ORAL | Status: DC
  Administered 2023-06-15 – 2023-06-19 (×8): 200 mg via ORAL
  Filled 2023-06-15 (×9): qty 1

## 2023-06-15 NOTE — Progress Notes (Signed)
PROGRESS NOTE    Andre [name]  ZOX:[id number] DOB: 09/20/1950 DOA: 06/14/2023 PCP: [name], MD    Chief Complaint  Patient presents with   UTI    Brief Narrative:  Patient is a 72 year old gentleman history of multiple myeloma, chronic pancytopenia, recent hospitalization for Pseudomonas UTI leading to sepsis presented to the ED due to concern for positive blood culture, generalized weakness noted to have acute on chronic anemia and increased falls.  Oncology consulted and following.  Assessment & Plan:   Principal Problem:   Anemia associated with multiple myeloma treated with erythropoietin (HCC) Active Problems:   Hypothyroidism   Multiple myeloma not having achieved remission (HCC)   GERD (gastroesophageal reflux disease)   Pancytopenia (HCC)   Hypotension   Positive blood culture  #1 acute on chronic anemia/symptomatic anemia -Patient with history of multiple myeloma treated with erythropoietin history of anemia presenting with acute on chronic anemia with no overt bleeding and noted on presentation to the ED to be hypotensive. -BP improved with IV fluids. -Patient has received a unit of PRBCs and currently about to receive a second unit of PRBCs. -Hemoglobin on admission noted at 6.6. -Posttransfusion CBC pending. -Will check a CT abdomen and pelvis to rule out any abdominal retroperitoneal bleed as patient with recent multiple falls with pancytopenia. -Hematology oncology following and appreciate input and recommendations.  2.  Pancytopenia -Likely secondary to chemotherapy for multiple myeloma. -Patient with no overt bleeding. -Hematology/oncology following.  3.  Positive blood culture -It is noted that only 1 culture was obtained at patient's facility prior to admission and could likely be a contaminant however unable to rule out bacteremia, patient immunocompromised with multiple myeloma and pancytopenia. -Repeat blood cultures pending. -Continue  empiric IV vancomycin and IV cefepime.  4.  Hypotension -Likely multifactorial secondary to hypovolemia and probable anemia with concern for possible sepsis due to 1 positive blood culture at facility. -Repeat blood cultures pending. -Patient with no pulmonary symptoms. -Urinalysis nitrite negative, leukocytes negative. -BP noted to have responded to IV fluids. -Patient being transfused 2 units PRBCs.  5.  GERD -PPI.  6.  Hypothyroidism -Synthroid.  7.  Multiple myeloma -Patient being followed by oncology during this hospitalization. -Outpatient follow-up with oncology.   DVT prophylaxis: SCDs Code Status: Full Family Communication: Updated patient.  No family at bedside. Disposition: TBD.  Status is: Inpatient Remains inpatient appropriate because: Severity of illness.   Consultants:  Hematology/oncology: Dr.Sherrill  Procedures:  Left-sided rib films 06/14/2023 Transfusing 2 units PRBCs.  Antimicrobials:  Anti-infectives (From admission, onward)    Start     Dose/Rate Route Frequency Ordered Stop   06/15/23 1800  vancomycin (VANCOREADY) IVPB 1500 mg/300 mL        1,500 mg 150 mL/hr over 120 Minutes Intravenous Every 24 hours 06/14/23 1449     06/15/23 1000  dapsone tablet 100 mg        100 mg Oral Daily 06/14/23 1405     06/15/23 1000  valACYclovir (VALTREX) tablet 500 mg        500 mg Oral Daily 06/14/23 1405     06/14/23 1600  ceFEPIme (MAXIPIME) 2 g in sodium chloride 0.9 % 100 mL IVPB        2 g 200 mL/hr over 30 Minutes Intravenous Every 8 hours 06/14/23 1449     06/14/23 1500  vancomycin (VANCOREADY) IVPB 1750 mg/350 mL        1,750 mg 175 mL/hr over 120 Minutes Intravenous  Once 06/14/23 1449 06/14/23 1755         Subjective: Patient laying in bed.  Denies any chest pain.  No shortness of breath.  No abdominal pain.  Overall feeling better.  Just finished a unit of PRBCs.  Awaiting second unit of PRBCs to be transfused.  Objective: Vitals:    06/15/23 1300 06/15/23 1325 06/15/23 1605 06/15/23 1613  BP: 119/60 (!) 112/56 120/73   Pulse: 87 82 70 70  Resp: 18 20 18 18   Temp: 97.6 F (36.4 C) 98.9 F (37.2 C) 98.1 F (36.7 C) 98.1 F (36.7 C)  TempSrc: Oral Oral Oral Oral  SpO2: 94% 94% 92% 92%    Intake/Output Summary (Last 24 hours) at 06/15/2023 1803 Last data filed at 06/15/2023 1700 Gross per 24 hour  Intake 1727 ml  Output 2600 ml  Net -873 ml   There were no vitals filed for this visit.  Examination:  General exam: NAD. Respiratory system: Clear to auscultation.  No wheezes, no crackles, no rhonchi.  Fair air movement.  Speaking in full sentences.  Left-sided ribs tender to palpation. Cardiovascular system: Regular rate rhythm no murmurs rubs or gallops.  No JVD.  No pitting lower extremity edema.  Gastrointestinal system: Abdomen is nondistended, soft and nontender. No organomegaly or masses felt. Normal bowel sounds heard. Central nervous system: Alert and oriented. No focal neurological deficits. Extremities: Symmetric 5 x 5 power. Skin: No rashes, lesions or ulcers Psychiatry: Judgement and insight appear normal. Mood & affect appropriate.     Data Reviewed: I have personally reviewed following labs and imaging studies  CBC: Recent Labs  Lab 06/14/23 1246 06/15/23 0510  WBC 3.5* 2.7*  NEUTROABS 2.3  --   HGB 6.6* 6.7*  HCT 20.4* 21.2*  MCV 121.4* 122.5*  PLT 29* 26*    Basic Metabolic Panel: Recent Labs  Lab 06/14/23 1246 06/15/23 0510  NA 136 137  K 3.9 4.5  CL 104 106  CO2 23 26  GLUCOSE 111* 95  BUN 23 18  CREATININE 0.96 0.89  CALCIUM 8.5* 8.6*    GFR: Estimated Creatinine Clearance: 68.1 mL/min (by C-G formula based on SCr of 0.89 mg/dL).  Liver Function Tests: Recent Labs  Lab 06/14/23 1246  AST 27  ALT 24  ALKPHOS 69  BILITOT 0.6  PROT 5.6*  ALBUMIN 3.0*    CBG: No results for input(s): "GLUCAP" in the last 168 hours.   Recent Results (from the past 240  hour(s))  Blood Culture (routine x 2)     Status: None (Preliminary result)   Collection Time: 06/14/23 12:46 PM   Specimen: BLOOD  Result Value Ref Range Status   Specimen Description   Final    BLOOD BLOOD RIGHT WRIST Performed at [facility], [address], [place], [place] [address]    Special Requests   Final    BOTTLES DRAWN AEROBIC AND ANAEROBIC Blood Culture results may not be optimal due to an excessive volume of blood received in culture bottles Performed at [name] [address] [place], [place] [address]    Culture   Final    NO GROWTH < 24 HOURS Performed at [facility], [address], [place], [place] [address]    Report Status PENDING  Incomplete  Blood Culture (routine x 2)     Status: None (Preliminary result)   Collection Time: 06/14/23  1:03 PM   Specimen: BLOOD  Result Value Ref Range Status  Specimen Description   Final    BLOOD BLOOD LEFT HAND Performed at [facility], [address] [place], [place] [address]    Special Requests   Final    BOTTLES DRAWN AEROBIC AND ANAEROBIC Blood Culture adequate volume Performed at [facility], [address] [place], [place] [address]    Culture   Final    NO GROWTH < 24 HOURS Performed at [facility], [address] [place], [place] [address]    Report Status PENDING  Incomplete         Radiology Studies: DG Ribs Unilateral W/Chest Left  Result Date: 06/14/2023 CLINICAL DATA:  Left rib pain after fall. EXAM: LEFT RIBS AND CHEST - 3+ VIEW COMPARISON:  May 20, 2023. FINDINGS: Mildly displaced acute fractures are seen involving the left sixth and seventh ribs laterally. Subacute to old left eighth rib fracture is noted. There is no evidence of pneumothorax or pleural effusion. Both lungs are clear. Heart size and mediastinal contours are within normal limits. IMPRESSION: Mildly displaced left sixth and  seventh rib fractures. Electronically Signed   By: Lupita Raider M.D.   On: 06/14/2023 15:54        Scheduled Meds:  sodium chloride   Intravenous Once   sodium chloride   Intravenous Once   dapsone  100 mg Oral Daily   escitalopram  10 mg Oral Daily   feeding supplement (GLUCERNA SHAKE)  237 mL Oral TID BM   ferrous sulfate  325 mg Oral BID   influenza vaccine adjuvanted  0.5 mL Intramuscular Tomorrow-1000   levothyroxine  125 mcg Oral Q0600   pantoprazole  40 mg Oral Daily   valACYclovir  500 mg Oral Daily   Continuous Infusions:  ceFEPime (MAXIPIME) IV 2 g (06/15/23 1117)   vancomycin       LOS: 1 day    Time spent: 40 minutes    Ramiro Harvest, MD [facility]   To contact the attending provider between 7A-7P or the covering provider during after hours 7P-7A, please log into the web site [email or web address] and access using universal [place] password for that web site. If you do not have the password, please call the hospital operator.  06/15/2023, 6:03 PM

## 2023-06-15 NOTE — TOC Initial Note (Signed)
Transition of Care [facility]) - Initial/Assessment Note    Patient Details  Name: Andre [name] MRN: [id number] Date of Birth: 1951-04-26  [facility] CM/SW Contact:    Adrian Prows, RN Phone Number: 06/15/2023, 2:05 PM  Clinical Narrative:                 [facility] consult for d/c planning; spoke w/ pt in room; he identified POC [name] Shotts [phone]); pt says he is from rehab at [facility] and he plans to return at d/c; he denies SDOH risks; pt says he has glasses, and walker. There are grab bars in shower; spoke w/ Dorothe Pea, Admissions at facility; she says pt is from short-term rehab, and he can return to facility; [facility] will follow.  Expected Discharge Plan: Skilled Nursing Facility Barriers to Discharge: Continued Medical Work up   Patient Goals and CMS Choice Patient states their goals for this hospitalization and ongoing recovery are:: return to rehab at [facility]          Expected Discharge Plan and Services   Discharge Planning Services: CM Consult Post Acute Care Choice: Resumption of Svcs/PTA Provider Living arrangements for the past 2 months: Skilled Nursing Facility ([facility])                                      Prior Living Arrangements/Services Living arrangements for the past 2 months: Skilled Nursing Facility ([facility]) Lives with:: Spouse Patient language and need for interpreter reviewed:: Yes Do you feel safe going back to the place where you live?: Yes      Need for Family Participation in Patient Care: Yes (Comment) Care giver support system in place?: Yes (comment)   Criminal Activity/Legal Involvement Pertinent to Current Situation/Hospitalization: No - Comment as needed  Activities of Daily Living   ADL Screening (condition at time of admission) Does the patient have a NEW difficulty with bathing/dressing/toileting/self-feeding that is expected to last >3 days?: No Does the patient  have a NEW difficulty with getting in/out of bed, walking, or climbing stairs that is expected to last >3 days?: No Does the patient have a NEW difficulty with communication that is expected to last >3 days?: No Is the patient deaf or have difficulty hearing?: No Does the patient have difficulty seeing, even when wearing glasses/contacts?: No Does the patient have difficulty concentrating, remembering, or making decisions?: No  Permission Sought/Granted Permission sought to share information with : Case Manager Permission granted to share information with : Yes, Verbal Permission Granted        Permission granted to share info w Relationship: [name] (spouse) [phone]     Emotional Assessment Appearance:: Appears stated age Attitude/Demeanor/Rapport: Gracious Affect (typically observed): Accepting Orientation: : Oriented to Self, Oriented to Place, Oriented to  Time, Oriented to Situation Alcohol / Substance Use: Not Applicable Psych Involvement: No (comment)  Admission diagnosis:  Weakness [R53.1] Anemia associated with multiple myeloma treated with erythropoietin (HCC) [C90.00, D63.0] Hypotension, unspecified hypotension type [I95.9] Acute anemia [D64.9] Patient Active Problem List   Diagnosis Date Noted   Anemia associated with multiple myeloma treated with erythropoietin (HCC) 06/14/2023   DNR (do not resuscitate)/DNI(Do Not Intubate) 05/20/2023   Severe sepsis (HCC) 05/20/2023   Acute cystitis without hematuria 05/20/2023   Acute metabolic encephalopathy 05/20/2023   Moderate-to-severe aortic regurgitation 05/20/2023   Macrocytic anemia 05/20/2023   Acute  hypoxemic respiratory failure (HCC) 05/05/2023   Reduced mobility 10/13/2022   Self-care deficit 10/13/2022   Physical deconditioning 10/02/2022   History of engineered cell therapy infusion 09/26/2022   Meteorism 02/09/2022   Memory problem 08/30/2021   Hemorrhoids 11/20/2019   Neuropathy due to  chemotherapeutic drug (HCC) 09/01/2019   Dry skin dermatitis 08/16/2016   Peripheral edema 04/26/2016   Bone marrow transplant status (HCC) 03/14/2016   Debility 02/26/2016   Back pain 02/26/2016   Patient in clinical research study 01/10/2016   GERD (gastroesophageal reflux disease)    Weakness 12/22/2015   General weakness    Multiple myeloma not having achieved remission (HCC)    Chronic anemia    Constipation    Thrombocytopenia (HCC) 11/05/2015   Actinic keratosis 07/08/2015   Raynaud's phenomenon 09/17/2013   Cough 01/10/2013   Vitamin D deficiency 09/30/2012   Hypothyroidism 09/30/2012   Hypogonadism male 09/30/2012   Fatigue 09/27/2012   Insomnia 09/27/2012   Reactive depression (situational) 09/27/2012   Pancytopenia (HCC) 03/27/2012   HYPERLIPIDEMIA 05/30/2010   Anemia of chronic disease 05/30/2010   Asthma 05/30/2010   SCHATZKI'S RING 05/30/2010   Peptic ulcer disease 05/30/2010   HIATAL HERNIA 05/30/2010   SLEEP APNEA, MILD 05/30/2010   TRANSAMINASES, SERUM, ELEVATED 05/30/2010   PCP:  [name], MD Pharmacy:   [facility] - [place], [place] - [address] [place] [address] Phone: [phone] Fax: [phone]     Social Determinants of Health (SDOH) Social History: SDOH Screenings   Food Insecurity: No Food Insecurity (06/15/2023)  Housing: Low Risk  (06/15/2023)  Transportation Needs: No Transportation Needs (06/15/2023)  Utilities: Not At Risk (06/15/2023)  Depression (PHQ2-9): Low Risk  (06/08/2022)  Tobacco Use: Medium Risk (06/14/2023)   SDOH Interventions: Food Insecurity Interventions: Intervention Not Indicated, Inpatient TOC Housing Interventions: Intervention Not Indicated, Inpatient TOC Transportation Interventions: Intervention Not Indicated, Inpatient TOC Utilities Interventions: Intervention Not Indicated, Inpatient TOC   Readmission Risk Interventions     No data to  display

## 2023-06-15 NOTE — Progress Notes (Signed)
Pt removed IV. IV team consulted for new placement. Unable to administer cefepime or any other IV meds until placed.

## 2023-06-15 NOTE — Plan of Care (Signed)

## 2023-06-15 NOTE — Plan of Care (Signed)
  Problem: Education: Goal: Knowledge of General Education information will improve Description: Including pain rating scale, medication(s)/side effects and non-pharmacologic comfort measures 06/15/2023 1945 by Gevena Cotton, RN Outcome: Progressing 06/15/2023 1940 by Gevena Cotton, RN Outcome: Progressing   Problem: Health Behavior/Discharge Planning: Goal: Ability to manage health-related needs will improve 06/15/2023 1945 by Gevena Cotton, RN Outcome: Progressing 06/15/2023 1940 by Gevena Cotton, RN Outcome: Progressing   Problem: Clinical Measurements: Goal: Ability to maintain clinical measurements within normal limits will improve 06/15/2023 1945 by Gevena Cotton, RN Outcome: Progressing 06/15/2023 1940 by Gevena Cotton, RN Outcome: Progressing Goal: Will remain free from infection 06/15/2023 1945 by Gevena Cotton, RN Outcome: Progressing 06/15/2023 1940 by Gevena Cotton, RN Outcome: Progressing Goal: Diagnostic test results will improve 06/15/2023 1945 by Gevena Cotton, RN Outcome: Progressing 06/15/2023 1940 by Gevena Cotton, RN Outcome: Progressing Goal: Respiratory complications will improve 06/15/2023 1945 by Gevena Cotton, RN Outcome: Progressing 06/15/2023 1940 by Gevena Cotton, RN Outcome: Progressing Goal: Cardiovascular complication will be avoided 06/15/2023 1945 by Gevena Cotton, RN Outcome: Progressing 06/15/2023 1940 by Gevena Cotton, RN Outcome: Progressing   Problem: Activity: Goal: Risk for activity intolerance will decrease 06/15/2023 1945 by Gevena Cotton, RN Outcome: Progressing 06/15/2023 1940 by Gevena Cotton, RN Outcome: Progressing   Problem: Nutrition: Goal: Adequate nutrition will be maintained 06/15/2023 1945 by Gevena Cotton, RN Outcome: Progressing 06/15/2023 1940 by Gevena Cotton, RN Outcome: Progressing   Problem: Elimination: Goal: Will not experience complications related to bowel motility 06/15/2023 1945 by Gevena Cotton, RN Outcome: Progressing 06/15/2023 1940 by Gevena Cotton, RN Outcome: Progressing Goal: Will not experience complications related to urinary retention 06/15/2023 1945 by Gevena Cotton, RN Outcome: Progressing 06/15/2023 1940 by Gevena Cotton, RN Outcome: Progressing   Problem: Pain Managment: Goal: General experience of comfort will improve 06/15/2023 1945 by Gevena Cotton, RN Outcome: Progressing 06/15/2023 1940 by Gevena Cotton, RN Outcome: Progressing   Problem: Safety: Goal: Ability to remain free from injury will improve Outcome: Progressing   Problem: Skin Integrity: Goal: Risk for impaired skin integrity will decrease Outcome: Progressing

## 2023-06-15 NOTE — Progress Notes (Signed)
IP PROGRESS NOTE  Subjective:   Mr. Lalone is well-known to me with a history of multiple myeloma, status post CAR-T therapy in January.  He has been admitted on multiple occasions recently with Pseudomonas bacteremia and a UTI. He reports falling on 06/06/2023.  He has pain in the left abdomen following the fall.  He denies bleeding.  [facility] and was noted to have bruising at the sacrum.  A urinalysis was reported to be negative (I do not have the report this morning).  He had a low-grade fever and was restarted on ciprofloxacin 06/08/2023.  A blood culture returned positive for "gram-positive cocci in clusters ".  He was referred to the emergency room yesterday.  He continues to have discomfort in the left upper abdomen.  He feels "weak ".  He denies fever and bleeding. In the emergency room he was noted to be hypotensive with severe anemia.  On 06/07/2023 the hemoglobin returned at 9.2.   Objective: Vital signs in last 24 hours: Blood pressure 136/68, pulse 61, temperature 98.4 F (36.9 C), temperature source Oral, resp. rate 20, SpO2 95%.  Intake/Output from previous day: 09/26 0701 - 09/27 0700 In: 315 [Blood:315] Out: 1275 [Urine:1275]  Physical Exam:  HEENT: Mild thrush at the pharynx and buccal mucosa, no bleeding Lungs: Inspiratory rales at the right greater than left posterior base, no respiratory distress Cardiac: Regular rate and rhythm Abdomen: Mild tenderness in the left mid lateral abdomen, no mass, no apparent hematoma, no hepatosplenomegaly Extremities: No leg edema   Portacath/PICC-without erythema  Lab Results: Recent Labs    06/14/23 1246 06/15/23 0510  WBC 3.5* 2.7*  HGB 6.6* 6.7*  HCT 20.4* 21.2*  PLT 29* 26*   Blood smear 06/15/2023: The platelets are decreased in number, no platelet clumps.  The majority the white cells are mature neutrophils.  No blasts or other young forms are seen.  The red cells are macrocytic,  increased polychromasia, ovalocytes and a few teardrops.  No nucleated red cells. BMET Recent Labs    06/14/23 1246 06/15/23 0510  NA 136 137  K 3.9 4.5  CL 104 106  CO2 23 26  GLUCOSE 111* 95  BUN 23 18  CREATININE 0.96 0.89  CALCIUM 8.5* 8.6*    No results found for: "CEA1", "CEA", "WUJ811", "CA125"  Studies/Results: DG Ribs Unilateral W/Chest Left  Result Date: 06/14/2023 CLINICAL DATA:  Left rib pain after fall. EXAM: LEFT RIBS AND CHEST - 3+ VIEW COMPARISON:  May 20, 2023. FINDINGS: Mildly displaced acute fractures are seen involving the left sixth and seventh ribs laterally. Subacute to old left eighth rib fracture is noted. There is no evidence of pneumothorax or pleural effusion. Both lungs are clear. Heart size and mediastinal contours are within normal limits. IMPRESSION: Mildly displaced left sixth and seventh rib fractures. Electronically Signed   By: Lupita Raider M.D.   On: 06/14/2023 15:54    Medications: I have reviewed the patient's current medications.  Assessment/Plan:  Multiple myeloma- IgG lambda  bortezomib (subcutaneously), lenalidomide, and dexamethasone, with repeat bone marrow biopsy May of 2012 showing 10% plasmacytosis   (2) high-dose chemotherapy with BCNU and melphalan at [facility], followed by stem cell rescue July of 2012   (3) on zoledronic acid started December of 2012, initially monthly, currently given every 3 months, most recent dose  12/07/2015   (4) low-dose lenalidomide resumed April 2013, interrupted several times.  Resumed again on 02/19/2013, ata dose of 5  mg daily, 21 days on and 7 days off, later further reduced to 7 days on, 7 days off   (5) CNS symptoms and abnormal brain MRI extensively evaluated by neurology with no definitive diagnosis established   (6) rising M spike noted June 2014 but did not meet criteria for carfilzomib study   (7) on lenalidomide 25 mg daily, 14 days on, 7 days off, starting 04/18/2013, interrupted  December 2014 because of rash;             (a) resumed January 2015 at 10 mg/ day at 21 days on/ 7 days off             (b) starting 08/18/2014 decreased to 10 mg/ day 14 days on, 7 days off because of cytopenias             (c) as of February 2016 was on 5 mg daily 7 days on 7 days off             (d) lenalidomide discontinued December 2016 with evidence of disease progression   (8) transient global amnesia 05/29/2015, resolved without intervention   (9) starting PVD 10/25/2015 w ASA 325 thromboprophylaxis, valacyclovir prophylaxis, last dose 12/17/2015             (a) pomalidomide 4 mg/d days 1-14             (b) bortezomib sQ days 2,5,9,12 of each 21 day cycle             (c) dexamethasome 20 mg two days a week             (d) dexamethasone bortezomib and pomalidomide discontinued late December 2018 with poor tolerance   (10) metapneumovirus pneumonia April 2017             (a) completing course of steroids and week of bactrim mid April 2017   (11) status post second autologous transplant at [facility] 02/04/2016(preparatory regimen melphalan 200 mg/m)             (a) received twelve-month vaccinations 03/14/2017 (DPT, Haemophilus, Pneumovax 13, polio)             (b) 14 months injections 05/04/2016 include DTaP, Hib conjugate, HepB energex B 20 mcg/ml, Prevnar 13             (c) 24 month vaccines due at [facility] June 2019   (12) maintenance therapy started November 2017, consisting of             (a) bortezomib 1.3 mg/M2 every 14 days, first dose 07/27/2016             (b) pomalidomide 1 mg days 1-21 Q28 days, started 07/19/2016             (c) zolendronate monthly started 07/27/2016 (previously Q 3 months) however patient unable to tolerate, and changed back to q3 months in April, 2018             (d) Bortezomib changed to monthly as of June 2018 because of tolerance issues, however discontinued after 09/14/2017 dose because of a rise in his M spike             (e) pomalidomide held after  10/18/2017 in preparation for possible study at [facility] (venetoclax)--never resumed             (f) with numbers actually improved off treatment, resumed every 2-week bortezomib 12/25/2017             (g)  changed to every 3-week bortezomib as of 03/17/2019             (h) briefly on weekly treatments times 22 October 2020 due to increase in M spike             (I) maintenance therapy discontinued with evidence of progression   (13) bortezomib/daratumumab/dexamethasone started 12/20/2020.             (a) Decadron dose dropped to 10 mg day of and day following treatment starting May 2022             (b) day 8 bortezomib omitted beginning with the June cycle per patient preference             (C) treatment changed to monthly daratumumab/Velcade/Decadron beginning 06/27/2021-patient decision (14) weekly Velcade/Cytoxan beginning 11/14/2021 (15) changed to Cytoxan/carfilzomib/Decadron 12/19/2021-treatment placed on hold 05/15/2022, carfilzomib 05/24/2022 and 05/31/2022 16.  Leukocyte a pheresis procedure at [facility] 06/15/2022 17.  Treatment resumed with Cytoxan/carfilzomib/Decadron 06/28/2022, last given 09/06/2022 18.  CAR-T 09/26/2022 with Carvykti 19.  CRS and ICANS following CAR-T therapy, status post a Decadron taper completed 10/08/2022 20.  Presentation to the emergency room 10/30/2022 with failure to thrive and a fall  21.  Pancytopenia secondary to multiple myeloma and CAR-T therapy-improved 22.  Bone marrow biopsy 11/13/2022-mildly hypocellular bone marrow with relative erythroid hyperplasia and decreased megakaryocytes; no plasma cells identified by differential count or CD138 immunohistochemical stain; flow cytometry negative for a clonal plasma cell population.   bone marrow biopsy 05/10/2023-mildly hypercellular marrow with mild decrease in megakaryocytes, plasma cells not increased, absent iron stores, 46 XY, negative myeloma FISH panel, negative myeloid panel 23.  Cognitive impairment  predating CAR-T therapy and worsened following CAR-T therapy 24.  Admission 05/05/2023 with Pseudomonas sepsis CTs chest, abdomen, and pelvis-right lung base atelectasis, no infiltrate 25.  Admission 05/20/2023 with hypoxia, altered mental status, and fever Infectious disease evaluation on hospital admission consistent with a urinary tract infection, urine culture positive for Pseudomonas aeruginosa 26.  Hypogammaglobulinemia secondary to multiple myeloma and CAR-T therapy 27.  Admission 06/14/2023 with severe anemia, hypotension, and report of a positive blood culture from 06/13/2023?  With gram-positive cocci in clusters, recent fall with a plain x-ray 06/14/2023 confirming left sixth and seventh rib fractures    Mr. Parish has a history of multiple myeloma.  He is in clinical remission from myeloma after undergoing CAR-T therapy earlier this year.  He has persistent pancytopenia following the CAR-T therapy.  A recent bone marrow biopsy did not reveal evidence of myeloma or another infiltrative bone marrow process.  He appears to have a bone marrow failure syndrome following CAR-T therapy.  He is admitted with severe symptomatic anemia following a recent fall.  There has been an acute drop in the hemoglobin over past week.  He may have a hematoma/hemorrhage related to the fall.  I doubt analysis explains the anemia.  Mr. Hevener has a history of recurrent infections.  No apparent source for infection at present.  Recommendations: Transfuse packed red blood cells for symptomatic anemia Consider CT imaging to look for evidence of an abdominal/retroperitoneal hemorrhage following the fall with rib fractures Antibiotics per the medical service Follow-up blood and urine cultures, consult infectious disease if he is confirmed to have recurrent bacterial infection 5.   Please call oncology as needed, I will check on him       LOS: 1 day   Thornton Papas, MD   06/15/2023, 7:51 AM

## 2023-06-16 ENCOUNTER — Encounter (HOSPITAL_COMMUNITY): Payer: Self-pay | Admitting: Internal Medicine

## 2023-06-16 ENCOUNTER — Inpatient Hospital Stay (HOSPITAL_COMMUNITY): Payer: Medicare Other

## 2023-06-16 DIAGNOSIS — C9000 Multiple myeloma not having achieved remission: Secondary | ICD-10-CM | POA: Diagnosis not present

## 2023-06-16 DIAGNOSIS — I959 Hypotension, unspecified: Secondary | ICD-10-CM | POA: Diagnosis not present

## 2023-06-16 DIAGNOSIS — S2249XA Multiple fractures of ribs, unspecified side, initial encounter for closed fracture: Secondary | ICD-10-CM | POA: Insufficient documentation

## 2023-06-16 DIAGNOSIS — D61818 Other pancytopenia: Secondary | ICD-10-CM | POA: Diagnosis not present

## 2023-06-16 DIAGNOSIS — S2242XD Multiple fractures of ribs, left side, subsequent encounter for fracture with routine healing: Secondary | ICD-10-CM

## 2023-06-16 DIAGNOSIS — K219 Gastro-esophageal reflux disease without esophagitis: Secondary | ICD-10-CM | POA: Diagnosis not present

## 2023-06-16 LAB — CBC WITH DIFFERENTIAL/PLATELET
Abs Immature Granulocytes: 0.08 10*3/uL — ABNORMAL HIGH (ref 0.00–0.07)
Basophils Absolute: 0 10*3/uL (ref 0.0–0.1)
Basophils Relative: 1 %
Eosinophils Absolute: 0.1 10*3/uL (ref 0.0–0.5)
Eosinophils Relative: 3 %
HCT: 29 % — ABNORMAL LOW (ref 39.0–52.0)
Hemoglobin: 9.7 g/dL — ABNORMAL LOW (ref 13.0–17.0)
Immature Granulocytes: 2 %
Lymphocytes Relative: 4 %
Lymphs Abs: 0.2 10*3/uL — ABNORMAL LOW (ref 0.7–4.0)
MCH: 35.7 pg — ABNORMAL HIGH (ref 26.0–34.0)
MCHC: 33.4 g/dL (ref 30.0–36.0)
MCV: 106.6 fL — ABNORMAL HIGH (ref 80.0–100.0)
Monocytes Absolute: 0.7 10*3/uL (ref 0.1–1.0)
Monocytes Relative: 18 %
Neutro Abs: 2.7 10*3/uL (ref 1.7–7.7)
Neutrophils Relative %: 72 %
Platelets: 23 10*3/uL — CL (ref 150–400)
RBC: 2.72 MIL/uL — ABNORMAL LOW (ref 4.22–5.81)
RDW: 26.2 % — ABNORMAL HIGH (ref 11.5–15.5)
WBC: 3.7 10*3/uL — ABNORMAL LOW (ref 4.0–10.5)
nRBC: 0 % (ref 0.0–0.2)

## 2023-06-16 LAB — RENAL FUNCTION PANEL
Albumin: 3 g/dL — ABNORMAL LOW (ref 3.5–5.0)
Anion gap: 8 (ref 5–15)
BUN: 17 mg/dL (ref 8–23)
CO2: 25 mmol/L (ref 22–32)
Calcium: 8.8 mg/dL — ABNORMAL LOW (ref 8.9–10.3)
Chloride: 104 mmol/L (ref 98–111)
Creatinine, Ser: 0.92 mg/dL (ref 0.61–1.24)
GFR, Estimated: >60 mL/min (ref >60)
Glucose, Bld: 92 mg/dL (ref 70–99)
Phosphorus: 3.7 mg/dL (ref 2.5–4.6)
Potassium: 3.9 mmol/L (ref 3.5–5.1)
Sodium: 137 mmol/L (ref 135–145)

## 2023-06-16 LAB — BPAM RBC
Blood Product Expiration Date: 202410152359
Unit Type and Rh: 6200

## 2023-06-16 LAB — TYPE AND SCREEN
ABO/RH(D): A POS
Antibody Screen: NEGATIVE

## 2023-06-16 LAB — MAGNESIUM: Magnesium: 2.3 mg/dL (ref 1.7–2.4)

## 2023-06-16 MED ORDER — IOHEXOL 300 MG/ML  SOLN
100.0000 mL | Freq: Once | INTRAMUSCULAR | Status: AC | PRN
  Administered 2023-06-16: 100 mL via INTRAVENOUS

## 2023-06-16 MED ORDER — LIDOCAINE 5 % EX PTCH
1.0000 | MEDICATED_PATCH | Freq: Every day | CUTANEOUS | Status: DC
  Administered 2023-06-16 – 2023-06-17 (×2): 1 via TRANSDERMAL
  Filled 2023-06-16 (×5): qty 1

## 2023-06-16 NOTE — Plan of Care (Signed)
  Problem: Education: Goal: Knowledge of General Education information will improve Description: Including pain rating scale, medication(s)/side effects and non-pharmacologic comfort measures Outcome: Progressing   Problem: Activity: Goal: Risk for activity intolerance will decrease Outcome: Progressing   Problem: Nutrition: Goal: Adequate nutrition will be maintained Outcome: Progressing   Problem: Coping: Goal: Level of anxiety will decrease Outcome: Progressing   Problem: Elimination: Goal: Will not experience complications related to urinary retention Outcome: Progressing   Problem: Pain Managment: Goal: General experience of comfort will improve Outcome: Progressing   Problem: Safety: Goal: Ability to remain free from injury will improve Outcome: Progressing   Problem: Skin Integrity: Goal: Risk for impaired skin integrity will decrease Outcome: Progressing   

## 2023-06-16 NOTE — Progress Notes (Signed)
PT Cancellation Note  Patient Details Name: Andre [name] MRN: [id number] DOB: [birth year]   Cancelled Treatment:    Reason Eval/Treat Not Completed: Fatigue/lethargy limiting ability to participate, Patient ambulated with Mobility specialist in AM. Andre [name] PT Acute Rehabilitation Services Office [phone] Weekend pager-[phone]   Andre [name] 06/16/2023, 3:21 PM

## 2023-06-16 NOTE — Progress Notes (Signed)
Mobility Specialist - Progress Note   06/16/23 0943  Mobility  Activity Ambulated with assistance in hallway  Level of Assistance Contact guard assist, steadying assist  Assistive Device Front wheel walker  Distance Ambulated (ft) 450 ft  Range of Motion/Exercises Active  Activity Response Tolerated fair  Mobility Referral Yes  $Mobility charge 1 Mobility  Mobility Specialist Start Time (ACUTE ONLY) 0930  Mobility Specialist Stop Time (ACUTE ONLY) 0943  Mobility Specialist Time Calculation (min) (ACUTE ONLY) 13 min   Pt was found in bed and agreeable to ambulate. During ambulation pt needs to be reminded to slow down. Grew fatigued with session. Had x3 brief standing rest breaks during session dur to fatigue. C/o L-sided pain. At EOS returned to bed with all needs met. Call bell in reach and family in room. Nt in room.  Billey Chang Mobility Specialist

## 2023-06-16 NOTE — Progress Notes (Signed)
PROGRESS NOTE    Andre [name]  ZOX:[id number] DOB: [birth year] DOA: 06/14/2023 PCP: [name], MD    Chief Complaint  Patient presents with   UTI    Brief Narrative:  Patient is a 72 year old gentleman history of multiple myeloma, chronic pancytopenia, recent hospitalization for Pseudomonas UTI leading to sepsis presented to the ED due to concern for positive blood culture, generalized weakness noted to have acute on chronic anemia and increased falls.  Oncology consulted and following.  Assessment & Plan:   Principal Problem:   Anemia associated with multiple myeloma treated with erythropoietin (HCC) Active Problems:   Hypothyroidism   Multiple myeloma not having achieved remission (HCC)   GERD (gastroesophageal reflux disease)   Pancytopenia (HCC)   Hypotension   Positive blood culture   Multiple rib fractures  #1 acute on chronic anemia/symptomatic anemia -Patient with history of multiple myeloma treated with erythropoietin history of anemia presenting with acute on chronic anemia with no overt bleeding and noted on presentation to the ED to be hypotensive. -BP improved with IV fluids. -Patient status post transfusion 2 units PRBCs.   -Hemoglobin currently at 9.7 from 6.6 on admission.  -CT abdomen and pelvis done today to rule out abdominal or retroperitoneal bleed pending. -Hematology/oncology following.  2.  Chronic pancytopenia -Patient with a persistent pancytopenia per hematology/oncology following CAR-T therapy.   -Per oncology patient with recent bone marrow biopsy with no evidence of myeloma or other infiltrative bone marrow process.  -Patient with no overt bleeding. -Transfuse as needed. -Hematology/oncology following.  3.  Positive blood culture -It is noted that only 1 culture was obtained at patient's facility prior to admission and could likely be a contaminant however unable to rule out bacteremia, patient immunocompromised with multiple myeloma  and pancytopenia. -Repeat blood cultures pending with no growth to date. -Continue empiric IV vancomycin and IV cefepime.  4.  Hypotension -Likely multifactorial secondary to hypovolemia and probable anemia with concern for possible sepsis due to 1 positive blood culture at facility. -Repeat blood cultures pending with no growth to date. -Patient with no pulmonary symptoms. -Urinalysis nitrite negative, leukocytes negative. -BP noted to have responded to IV fluids. -Patient status post transfusion 2 units PRBCs.   -Follow.   5.  GERD -PPI.  6.  Hypothyroidism -Continue Synthroid.  7.  Multiple myeloma -Patient being followed by oncology during this hospitalization. -Outpatient follow-up with oncology.  8.  Left sixth and seventh rib fractures -Secondary to mechanical fall -Place a Lidoderm patch. -Incentive spirometry.   DVT prophylaxis: SCDs Code Status: Full Family Communication: Updated patient.  Updated daughter at bedside.   Disposition: TBD.  Status is: Inpatient Remains inpatient appropriate because: Severity of illness.   Consultants:  Hematology/oncology: Dr.Sherrill  Procedures:  Left-sided rib films 06/14/2023 Transfused 2 units PRBCs 06/15/2023 CT abdomen and pelvis pending 06/16/2023  Antimicrobials:  Anti-infectives (From admission, onward)    Start     Dose/Rate Route Frequency Ordered Stop   06/15/23 1800  vancomycin (VANCOREADY) IVPB 1500 mg/300 mL        1,500 mg 150 mL/hr over 120 Minutes Intravenous Every 24 hours 06/14/23 1449     06/15/23 1000  dapsone tablet 100 mg        100 mg Oral Daily 06/14/23 1405     06/15/23 1000  valACYclovir (VALTREX) tablet 500 mg        500 mg Oral Daily 06/14/23 1405     06/14/23 1600  ceFEPIme (MAXIPIME) 2 g in  sodium chloride 0.9 % 100 mL IVPB        2 g 200 mL/hr over 30 Minutes Intravenous Every 8 hours 06/14/23 1449     06/14/23 1500  vancomycin (VANCOREADY) IVPB 1750 mg/350 mL        1,750 mg 175  mL/hr over 120 Minutes Intravenous  Once 06/14/23 1449 06/14/23 1755         Subjective: Patient just returned from CT scan.  Denies any chest pain, no shortness of breath, no abdominal pain.  Complaining of left-sided rib pain.  Denies any bleeding.  Patient noted to have some sundowning overnight.  Objective: Vitals:   06/15/23 1613 06/15/23 2055 06/16/23 0605 06/16/23 1443  BP:  (!) 158/75 135/68 130/68  Pulse: 70 72 72 68  Resp: 18 20 18 18   Temp: 98.1 F (36.7 C) 97.8 F (36.6 C) 98.4 F (36.9 C) 98.9 F (37.2 C)  TempSrc: Oral Oral Oral Oral  SpO2: 92% 93% 94% 97%    Intake/Output Summary (Last 24 hours) at 06/16/2023 1701 Last data filed at 06/16/2023 1425 Gross per 24 hour  Intake 720 ml  Output 1800 ml  Net -1080 ml   There were no vitals filed for this visit.  Examination:  General exam: NAD. Respiratory system: Lungs clear to auscultation bilaterally.  No wheezes, no crackles, no rhonchi.  Fair air movement.  Speaking in full sentences.  Left ribs tender to palpation.   Cardiovascular system: RRR no murmurs rubs or gallops.  No JVD.  No pitting lower extremity edema.  Gastrointestinal system: Abdomen is soft, nontender, nondistended, positive bowel sounds.  No rebound.  No guarding. Central nervous system: Alert and oriented. No focal neurological deficits. Extremities: Symmetric 5 x 5 power. Skin: No rashes, lesions or ulcers Psychiatry: Judgement and insight appear normal. Mood & affect appropriate.     Data Reviewed: I have personally reviewed following labs and imaging studies  CBC: Recent Labs  Lab 06/14/23 1246 06/15/23 0510 06/15/23 1838 06/16/23 0536  WBC 3.5* 2.7*  --  3.7*  NEUTROABS 2.3  --   --  2.7  HGB 6.6* 6.7* 11.0* 9.7*  HCT 20.4* 21.2* 32.7* 29.0*  MCV 121.4* 122.5*  --  106.6*  PLT 29* 26*  --  23*    Basic Metabolic Panel: Recent Labs  Lab 06/14/23 1246 06/15/23 0510 06/16/23 0536  NA 136 137 137  K 3.9 4.5 3.9  CL  104 106 104  CO2 23 26 25   GLUCOSE 111* 95 92  BUN 23 18 17   CREATININE 0.96 0.89 0.92  CALCIUM 8.5* 8.6* 8.8*  MG  --   --  2.3  PHOS  --   --  3.7    GFR: Estimated Creatinine Clearance: 65.9 mL/min (by C-G formula based on SCr of 0.92 mg/dL).  Liver Function Tests: Recent Labs  Lab 06/14/23 1246 06/16/23 0536  AST 27  --   ALT 24  --   ALKPHOS 69  --   BILITOT 0.6  --   PROT 5.6*  --   ALBUMIN 3.0* 3.0*    CBG: No results for input(s): "GLUCAP" in the last 168 hours.   Recent Results (from the past 240 hour(s))  Blood Culture (routine x 2)     Status: None (Preliminary result)   Collection Time: 06/14/23 12:46 PM   Specimen: BLOOD  Result Value Ref Range Status   Specimen Description   Final    BLOOD BLOOD RIGHT WRIST Performed at [facility], [address] [place], [place] [address]    Special Requests   Final    BOTTLES DRAWN AEROBIC AND ANAEROBIC Blood Culture results may not be optimal due to an excessive volume of blood received in culture bottles Performed at [facility], [address], [place], [place] [address]    Culture   Final    NO GROWTH 2 DAYS Performed at [facility], [address], [place], [place] [address]    Report Status PENDING  Incomplete  Blood Culture (routine x 2)     Status: None (Preliminary result)   Collection Time: 06/14/23  1:03 PM   Specimen: BLOOD  Result Value Ref Range Status   Specimen Description   Final    BLOOD BLOOD LEFT HAND Performed at [facility], [address] [place], [place] [address]    Special Requests   Final    BOTTLES DRAWN AEROBIC AND ANAEROBIC Blood Culture adequate volume Performed at [facility], [address] [place], [place] [address]    Culture   Final    NO GROWTH 2 DAYS Performed at [facility], [address], [place], [place] [address]    Report Status PENDING  Incomplete          Radiology Studies: CT ABDOMEN PELVIS W CONTRAST  Result Date: 06/16/2023 CLINICAL DATA:  Acute, non localized abdominal pain. Recent fall. Clinical concern for intra-abdominal or retroperitoneal hemorrhage. History of multiple myeloma. EXAM: CT ABDOMEN AND PELVIS WITH CONTRAST TECHNIQUE: Multidetector CT imaging of the abdomen and pelvis was performed using the standard protocol following bolus administration of intravenous contrast. RADIATION DOSE REDUCTION: This exam was performed according to the departmental dose-optimization program which includes automated exposure control, adjustment of the mA and/or kV according to patient size and/or use of iterative reconstruction technique. CONTRAST:  OMNIPAQUE IOHEXOL 300 MG/ML  SOLN COMPARISON:  05/05/2023 FINDINGS: Lower chest: Interval minimal left pleural effusion. A large hiatal hernia is again demonstrated in the left lower chest medially. Mild right lower lobe atelectasis with mild progression. Stable mild left lower lobe atelectasis. Hepatobiliary: 3 mm gallstone in the gallbladder. No gallbladder thickening or pericholecystic fluid. Diffuse low density of the liver relative to the spleen. Pancreas: Unremarkable. No pancreatic ductal dilatation or surrounding inflammatory changes. Spleen: Normal in size without focal abnormality. Adrenals/Urinary Tract: Normal-appearing adrenal glands. Several small left renal simple cysts. These do not need imaging follow-up. Normal-appearing right kidney, ureters and urinary bladder. Stomach/Bowel: Large hiatal hernia. Mild sigmoid colon diverticulosis without evidence of diverticulitis. Prominent stool in the right colon and proximal hepatic flexure. Colon interposition on the right. Normal-appearing appendix. Unremarkable small bowel. Vascular/Lymphatic: Atheromatous arterial calcifications without aneurysm. No enlarged lymph nodes. Reproductive: Prostate is unremarkable. Other: Small to moderate-sized  right and small left inguinal hernias. No intraperitoneal hemorrhage. Musculoskeletal: No retroperitoneal hemorrhage. Multiple yield, healing and acute left lower rib fractures. Mild-to-moderate right hip degenerative changes. Lumbar and lower thoracic spine degenerative changes. Stable old T12 compression deformity with no acute vertebral fractures or subluxations. IMPRESSION: 1. No intraperitoneal or retroperitoneal hemorrhage. 2. Multiple acute, subacute and healing left lower rib fractures. 3. Interval minimal left pleural effusion. 4. Large hiatal hernia. 5. Cholelithiasis. 6. Diffuse hepatic steatosis. 7. Mild sigmoid colon diverticulosis. 8. Small to moderate-sized right and small left inguinal hernias containing fat. Electronically Signed   By: Beckie Salts M.D.   On: 06/16/2023 15:48  Scheduled Meds:  sodium chloride   Intravenous Once   sodium chloride   Intravenous Once   calcium carbonate  1 tablet Oral BID   dapsone  100 mg Oral Daily   escitalopram  10 mg Oral Daily   feeding supplement (GLUCERNA SHAKE)  237 mL Oral TID BM   ferrous sulfate  325 mg Oral BID   levothyroxine  125 mcg Oral Q0600   lidocaine  1 patch Transdermal Daily   pantoprazole  40 mg Oral Daily   valACYclovir  500 mg Oral Daily   Continuous Infusions:  ceFEPime (MAXIPIME) IV 2 g (06/16/23 0827)   vancomycin 1,500 mg (06/15/23 2048)     LOS: 2 days    Time spent: 40 minutes    Ramiro Harvest, MD [facility]   To contact the attending provider between 7A-7P or the covering provider during after hours 7P-7A, please log into the web site [email or web address] and access using universal [place] password for that web site. If you do not have the password, please call the hospital operator.  06/16/2023, 5:01 PM

## 2023-06-17 DIAGNOSIS — C9000 Multiple myeloma not having achieved remission: Secondary | ICD-10-CM | POA: Diagnosis not present

## 2023-06-17 DIAGNOSIS — S2242XD Multiple fractures of ribs, left side, subsequent encounter for fracture with routine healing: Secondary | ICD-10-CM | POA: Diagnosis not present

## 2023-06-17 DIAGNOSIS — K219 Gastro-esophageal reflux disease without esophagitis: Secondary | ICD-10-CM | POA: Diagnosis not present

## 2023-06-17 DIAGNOSIS — I959 Hypotension, unspecified: Secondary | ICD-10-CM | POA: Diagnosis not present

## 2023-06-17 LAB — CBC WITH DIFFERENTIAL/PLATELET
Abs Immature Granulocytes: 0.06 10*3/uL (ref 0.00–0.07)
Basophils Absolute: 0 10*3/uL (ref 0.0–0.1)
Basophils Relative: 1 %
Eosinophils Absolute: 0.1 10*3/uL (ref 0.0–0.5)
Eosinophils Relative: 3 %
HCT: 30.8 % — ABNORMAL LOW (ref 39.0–52.0)
Hemoglobin: 10.1 g/dL — ABNORMAL LOW (ref 13.0–17.0)
Immature Granulocytes: 2 %
Lymphocytes Relative: 6 %
Lymphs Abs: 0.2 10*3/uL — ABNORMAL LOW (ref 0.7–4.0)
MCH: 35.8 pg — ABNORMAL HIGH (ref 26.0–34.0)
MCHC: 32.8 g/dL (ref 30.0–36.0)
MCV: 109.2 fL — ABNORMAL HIGH (ref 80.0–100.0)
Monocytes Absolute: 0.6 10*3/uL (ref 0.1–1.0)
Monocytes Relative: 15 %
Neutro Abs: 2.8 10*3/uL (ref 1.7–7.7)
Neutrophils Relative %: 73 %
Platelets: 23 10*3/uL — CL (ref 150–400)
RBC: 2.82 MIL/uL — ABNORMAL LOW (ref 4.22–5.81)
RDW: 25 % — ABNORMAL HIGH (ref 11.5–15.5)
WBC: 3.8 10*3/uL — ABNORMAL LOW (ref 4.0–10.5)
nRBC: 0 % (ref 0.0–0.2)

## 2023-06-17 LAB — BASIC METABOLIC PANEL
Anion gap: 8 (ref 5–15)
BUN: 17 mg/dL (ref 8–23)
CO2: 25 mmol/L (ref 22–32)
Calcium: 9 mg/dL (ref 8.9–10.3)
Chloride: 103 mmol/L (ref 98–111)
Creatinine, Ser: 0.84 mg/dL (ref 0.61–1.24)
GFR, Estimated: >60 mL/min (ref >60)
Glucose, Bld: 90 mg/dL (ref 70–99)
Potassium: 4.2 mmol/L (ref 3.5–5.1)
Sodium: 136 mmol/L (ref 135–145)

## 2023-06-17 MED ORDER — ORAL CARE MOUTH RINSE: 15.0000 mL | OROMUCOSAL | Status: DC | PRN

## 2023-06-17 NOTE — Progress Notes (Signed)
PROGRESS NOTE    Andre [name]  UJW:[id number] DOB: 01/19/1951 DOA: 06/14/2023 PCP: [name], MD    Chief Complaint  Patient presents with   UTI    Brief Narrative:  Patient is a 72 year old gentleman history of multiple myeloma, chronic pancytopenia, recent hospitalization for Pseudomonas UTI leading to sepsis presented to the ED due to concern for positive blood culture, generalized weakness noted to have acute on chronic anemia and increased falls.  Oncology consulted and following.  Assessment & Plan:   Principal Problem:   Anemia associated with multiple myeloma treated with erythropoietin (HCC) Active Problems:   Hypothyroidism   Multiple myeloma not having achieved remission (HCC)   GERD (gastroesophageal reflux disease)   Pancytopenia (HCC)   Hypotension   Positive blood culture   Multiple rib fractures  #1 acute on chronic anemia/symptomatic anemia -Patient with history of multiple myeloma treated with erythropoietin history of anemia presenting with acute on chronic anemia with no overt bleeding and noted on presentation to the ED to be hypotensive. -BP improved with IV fluids. -Patient status post transfusion 2 units PRBCs.   -Hemoglobin currently at 10.1 from 6.6 on admission.  -CT abdomen and pelvis done negative for intra-abdominal retroperitoneal bleed.  -Hematology/oncology following.  2.  Chronic pancytopenia -Patient with a persistent pancytopenia per hematology/oncology following CAR-T therapy.   -Per oncology patient with recent bone marrow biopsy with no evidence of myeloma or other infiltrative bone marrow process.  -Patient with no overt bleeding. -Transfuse as needed. -Hematology/oncology following.  3.  Positive blood culture -It is noted that only 1 culture was obtained at patient's facility prior to admission and could likely be a contaminant however unable to rule out bacteremia, patient immunocompromised with multiple myeloma and  pancytopenia. -Repeat blood cultures pending with no growth to date. -Continue empiric IV vancomycin and IV cefepime. -If repeat blood cultures continue to remain negative tomorrow may consider discontinuing IV vancomycin and could potentially transition to oral antibiotics to complete a 5 to 7-day empiric course of antibiotic treatment.  4.  Hypotension -Likely multifactorial secondary to hypovolemia and probable anemia with concern for possible sepsis due to 1 positive blood culture at facility. -Repeat blood cultures pending with no growth to date. -Patient with no pulmonary symptoms. -Urinalysis nitrite negative, leukocytes negative. -BP noted to have responded to IV fluids. -Patient status post transfusion 2 units PRBCs.   - -Follow.   5.  GERD -PPI.  6.  Hypothyroidism -Synthroid.  7.  Multiple myeloma -Patient being followed by oncology during this hospitalization. -Outpatient follow-up with oncology.  8.  Left sixth and seventh rib fractures -Secondary to mechanical fall -Continue Lidoderm patch.   -Incentive spirometry.    DVT prophylaxis: SCDs Code Status: Full Family Communication: Updated patient.  Updated daughter at bedside.   Disposition: TBD.  Status is: Inpatient Remains inpatient appropriate because: Severity of illness.   Consultants:  Hematology/oncology: Dr.Sherrill  Procedures:  Left-sided rib films 06/14/2023 Transfused 2 units PRBCs 06/15/2023 CT abdomen and pelvis 06/16/2023  Antimicrobials:  Anti-infectives (From admission, onward)    Start     Dose/Rate Route Frequency Ordered Stop   06/15/23 1800  vancomycin (VANCOREADY) IVPB 1500 mg/300 mL        1,500 mg 150 mL/hr over 120 Minutes Intravenous Every 24 hours 06/14/23 1449     06/15/23 1000  dapsone tablet 100 mg        100 mg Oral Daily 06/14/23 1405     06/15/23 1000  valACYclovir (VALTREX) tablet 500 mg        500 mg Oral Daily 06/14/23 1405     06/14/23 1600  ceFEPIme (MAXIPIME) 2  g in sodium chloride 0.9 % 100 mL IVPB        2 g 200 mL/hr over 30 Minutes Intravenous Every 8 hours 06/14/23 1449     06/14/23 1500  vancomycin (VANCOREADY) IVPB 1750 mg/350 mL        1,750 mg 175 mL/hr over 120 Minutes Intravenous  Once 06/14/23 1449 06/14/23 1755         Subjective: Patient laying in bed.  Daughter at bedside.  Denies any chest pain, no shortness of breath, no abdominal pain.  Feels left-sided rib pain improving.  Using incentive spirometry.  Denies any bleeding.    Objective: Vitals:   06/16/23 1443 06/16/23 2024 06/17/23 0448 06/17/23 1114  BP: 130/68 (!) 146/77 137/77   Pulse: 68 78 65   Resp: 18 18 16    Temp: 98.9 F (37.2 C) 98.2 F (36.8 C) 99.1 F (37.3 C)   TempSrc: Oral Oral Oral   SpO2: 97% 93% 92% 93%    Intake/Output Summary (Last 24 hours) at 06/17/2023 1201 Last data filed at 06/17/2023 0900 Gross per 24 hour  Intake 1877.21 ml  Output 2000 ml  Net -122.79 ml   There were no vitals filed for this visit.  Examination:  General exam: NAD. Respiratory system: CTAB anterior lung fields.  No wheezes, no crackles, no rhonchi.  Fair air movement.  Speaking in full sentences.  Left ribs TTP. Cardiovascular system: Regular rate rhythm no murmurs rubs or gallops.  No JVD.  No lower extremity edema.  Gastrointestinal system: Abdomen soft, nontender, nondistended, positive bowel sounds.  No rebound.  No guarding.   Central nervous system: Alert and oriented. No focal neurological deficits. Extremities: Symmetric 5 x 5 power. Skin: No rashes, lesions or ulcers Psychiatry: Judgement and insight appear normal. Mood & affect appropriate.     Data Reviewed: I have personally reviewed following labs and imaging studies  CBC: Recent Labs  Lab 06/14/23 1246 06/15/23 0510 06/15/23 1838 06/16/23 0536 06/17/23 0517  WBC 3.5* 2.7*  --  3.7* 3.8*  NEUTROABS 2.3  --   --  2.7 2.8  HGB 6.6* 6.7* 11.0* 9.7* 10.1*  HCT 20.4* 21.2* 32.7* 29.0* 30.8*   MCV 121.4* 122.5*  --  106.6* 109.2*  PLT 29* 26*  --  23* 23*    Basic Metabolic Panel: Recent Labs  Lab 06/14/23 1246 06/15/23 0510 06/16/23 0536 06/17/23 0517  NA 136 137 137 136  K 3.9 4.5 3.9 4.2  CL 104 106 104 103  CO2 23 26 25 25   GLUCOSE 111* 95 92 90  BUN 23 18 17 17   CREATININE 0.96 0.89 0.92 0.84  CALCIUM 8.5* 8.6* 8.8* 9.0  MG  --   --  2.3  --   PHOS  --   --  3.7  --     GFR: Estimated Creatinine Clearance: 72.1 mL/min (by C-G formula based on SCr of 0.84 mg/dL).  Liver Function Tests: Recent Labs  Lab 06/14/23 1246 06/16/23 0536  AST 27  --   ALT 24  --   ALKPHOS 69  --   BILITOT 0.6  --   PROT 5.6*  --   ALBUMIN 3.0* 3.0*    CBG: No results for input(s): "GLUCAP" in the last 168 hours.   Recent Results (from the past 240 hour(s))  Blood Culture (routine x 2)     Status: None (Preliminary result)   Collection Time: 06/14/23 12:46 PM   Specimen: BLOOD  Result Value Ref Range Status   Specimen Description   Final    BLOOD BLOOD RIGHT WRIST Performed at [facility], [address] [place], [place] [address]    Special Requests   Final    BOTTLES DRAWN AEROBIC AND ANAEROBIC Blood Culture results may not be optimal due to an excessive volume of blood received in culture bottles Performed at [facility], [address], [place], [place] [address]    Culture   Final    NO GROWTH 3 DAYS Performed at [facility], [address], [place], [place] [address]    Report Status PENDING  Incomplete  Blood Culture (routine x 2)     Status: None (Preliminary result)   Collection Time: 06/14/23  1:03 PM   Specimen: BLOOD  Result Value Ref Range Status   Specimen Description   Final    BLOOD BLOOD LEFT HAND Performed at [facility], [address] [place], [place] [address]    Special Requests   Final    BOTTLES DRAWN AEROBIC AND ANAEROBIC Blood Culture adequate volume Performed  at [facility], [address], [place], [place] [address]    Culture   Final    NO GROWTH 3 DAYS Performed at [facility], [address] [place], [place] [address]    Report Status PENDING  Incomplete         Radiology Studies: CT ABDOMEN PELVIS W CONTRAST  Result Date: 06/16/2023 CLINICAL DATA:  Acute, non localized abdominal pain. Recent fall. Clinical concern for intra-abdominal or retroperitoneal hemorrhage. History of multiple myeloma. EXAM: CT ABDOMEN AND PELVIS WITH CONTRAST TECHNIQUE: Multidetector CT imaging of the abdomen and pelvis was performed using the standard protocol following bolus administration of intravenous contrast. RADIATION DOSE REDUCTION: This exam was performed according to the departmental dose-optimization program which includes automated exposure control, adjustment of the mA and/or kV according to patient size and/or use of iterative reconstruction technique. CONTRAST:  OMNIPAQUE IOHEXOL 300 MG/ML  SOLN COMPARISON:  05/05/2023 FINDINGS: Lower chest: Interval minimal left pleural effusion. A large hiatal hernia is again demonstrated in the left lower chest medially. Mild right lower lobe atelectasis with mild progression. Stable mild left lower lobe atelectasis. Hepatobiliary: 3 mm gallstone in the gallbladder. No gallbladder thickening or pericholecystic fluid. Diffuse low density of the liver relative to the spleen. Pancreas: Unremarkable. No pancreatic ductal dilatation or surrounding inflammatory changes. Spleen: Normal in size without focal abnormality. Adrenals/Urinary Tract: Normal-appearing adrenal glands. Several small left renal simple cysts. These do not need imaging follow-up. Normal-appearing right kidney, ureters and urinary bladder. Stomach/Bowel: Large hiatal hernia. Mild sigmoid colon diverticulosis without evidence of diverticulitis. Prominent stool in the right colon and proximal hepatic flexure. Colon interposition  on the right. Normal-appearing appendix. Unremarkable small bowel. Vascular/Lymphatic: Atheromatous arterial calcifications without aneurysm. No enlarged lymph nodes. Reproductive: Prostate is unremarkable. Other: Small to moderate-sized right and small left inguinal hernias. No intraperitoneal hemorrhage. Musculoskeletal: No retroperitoneal hemorrhage. Multiple yield, healing and acute left lower rib fractures. Mild-to-moderate right hip degenerative changes. Lumbar and lower thoracic spine degenerative changes. Stable old T12 compression deformity with no acute vertebral fractures or subluxations. IMPRESSION: 1. No intraperitoneal or retroperitoneal hemorrhage. 2. Multiple acute, subacute and healing left lower rib fractures. 3. Interval minimal left pleural effusion.  4. Large hiatal hernia. 5. Cholelithiasis. 6. Diffuse hepatic steatosis. 7. Mild sigmoid colon diverticulosis. 8. Small to moderate-sized right and small left inguinal hernias containing fat. Electronically Signed   By: Beckie Salts M.D.   On: 06/16/2023 15:48        Scheduled Meds:  sodium chloride   Intravenous Once   sodium chloride   Intravenous Once   calcium carbonate  1 tablet Oral BID   dapsone  100 mg Oral Daily   escitalopram  10 mg Oral Daily   feeding supplement (GLUCERNA SHAKE)  237 mL Oral TID BM   ferrous sulfate  325 mg Oral BID   levothyroxine  125 mcg Oral Q0600   lidocaine  1 patch Transdermal Daily   pantoprazole  40 mg Oral Daily   valACYclovir  500 mg Oral Daily   Continuous Infusions:  ceFEPime (MAXIPIME) IV 2 g (06/17/23 0834)   vancomycin 1,500 mg (06/16/23 1824)     LOS: 3 days    Time spent: 40 minutes    Ramiro Harvest, MD [facility]   To contact the attending provider between 7A-7P or the covering provider during after hours 7P-7A, please log into the web site [email or web address] and access using universal [place] password for that web site. If you do not have the password, please  call the hospital operator.  06/17/2023, 12:01 PM

## 2023-06-17 NOTE — Plan of Care (Signed)

## 2023-06-17 NOTE — Evaluation (Signed)
Occupational Therapy Evaluation Patient Details Name: Andre [name] MRN: [id number] DOB: [birth year] Today's Date: 06/17/2023   History of Present Illness 72 y/o admitted from SNF after episode of weakness and he felt like started to fall. PMH: HTN, hyperlipidemia and duodenal ulcer, acid reflux, depression, hypothyroidism, multiple myeloma, TIA   Patient is currently residing on the rehab unit of [facility].  Patient was in the hospital earlier this month for delirium.  Patient also has had issues with recent falls in the last couple of weeks.   Clinical Impression   Patient is a 72 year old male who was admitted for above. Patient reported living at [facility] prior level with spouse. Currently, patient was unable to maintain sitting balance sitting EOB with dynamic sitting tasks with min A to maintain balance. Patient with increased pain, decreased safety awareness, decreased functional activity tolerance impacting participation in ADLs.Patient will benefit from continued inpatient follow up therapy, <3 hours/day. Patient would continue to benefit from skilled OT services at this time while admitted and after d/c to address noted deficits in order to improve overall safety and independence in ADLs.        If plan is discharge home, recommend the following: Assistance with cooking/housework;Direct supervision/assist for medications management;A lot of help with bathing/dressing/bathroom;A lot of help with walking and/or transfers    Functional Status Assessment  Patient has had a recent decline in their functional status and demonstrates the ability to make significant improvements in function in a reasonable and predictable amount of time.  Equipment Recommendations  None recommended by OT       Precautions / Restrictions Precautions Precautions: Fall Precaution Comments: monitor O2 Restrictions Weight Bearing Restrictions: No      Mobility Bed  Mobility Overal bed mobility: Needs Assistance Bed Mobility: Supine to Sit     Supine to sit: Contact guard, HOB elevated, Used rails Sit to supine: Min assist            Balance Overall balance assessment: Needs assistance Sitting-balance support: Feet supported, Bilateral upper extremity supported Sitting balance-Leahy Scale: Fair Sitting balance - Comments: Posterior lean Postural control: Posterior lean         ADL either performed or assessed with clinical judgement   ADL Overall ADL's : Needs assistance/impaired Eating/Feeding: Independent;Sitting     Grooming Details (indicate cue type and reason): unable to maintain balance sitting EOB Upper Body Bathing: Set up;Bed level   Lower Body Bathing: Bed level;Minimal assistance Lower Body Bathing Details (indicate cue type and reason): patient having diffiuclt ytime maintaing balance sitting EOB for figure four positioning of BLE Upper Body Dressing : Bed level;Set up   Lower Body Dressing: Sit to/from stand;Maximal assistance     Toilet Transfer Details (indicate cue type and reason): patient declined to stand reporting fatiue with sitting EOB working on balance. Toileting- Clothing Manipulation and Hygiene: Maximal assistance;Sit to/from stand                Pertinent Vitals/Pain Pain Assessment Pain Assessment: 0-10 Faces Pain Scale: Hurts a little bit Pain Location: L ribs and neck Pain Descriptors / Indicators: Sore Pain Intervention(s): Monitored during session, Limited activity within patient's tolerance        Communication Communication Communication: No apparent difficulties   Cognition Arousal: Alert Behavior During Therapy: WFL for tasks assessed/performed Overall Cognitive Status: History of cognitive impairments - at baseline Area of Impairment: Memory, Problem solving, Awareness       Memory: Decreased short-term  memory       Problem Solving: Slow processing, Requires verbal  cues General Comments: patient was oriented to day of week, place, self, 06/18/2023                Home Living Family/patient expects to be discharged to:: Skilled nursing facility         Home Equipment: Rolling Walker (2 wheels)   Additional Comments: Last month he has been at rehab at [facility], but has an ALF apartment at [facility].      Prior Functioning/Environment Prior Level of Function : Needs assist       Physical Assist : Mobility (physical) Mobility (physical): Gait   Mobility Comments: Pt reported use of a RW for ambulation. ADLs Comments: At the ALF, he reported being modified independent to independent with bathing, dressing, and toileting.        OT Problem List: Decreased strength;Decreased activity tolerance;Impaired balance (sitting and/or standing)      OT Treatment/Interventions: Self-care/ADL training;Therapeutic exercise;Patient/family education;Balance training;DME and/or AE instruction;Energy conservation;Therapeutic activities    OT Goals(Current goals can be found in the care plan section) Acute Rehab OT Goals Patient Stated Goal: to get better OT Goal Formulation: With patient Time For Goal Achievement: 07/01/23 Potential to Achieve Goals: Fair  OT Frequency: Min 1X/week       AM-PAC OT "6 Clicks" Daily Activity     Outcome Measure Help from another person eating meals?: None Help from another person taking care of personal grooming?: A Little Help from another person toileting, which includes using toliet, bedpan, or urinal?: A Lot Help from another person bathing (including washing, rinsing, drying)?: A Lot Help from another person to put on and taking off regular upper body clothing?: A Little Help from another person to put on and taking off regular lower body clothing?: A Lot 6 Click Score: 16   End of Session Nurse Communication: Mobility status  Activity Tolerance: Patient tolerated treatment well Patient left: in  bed;with call bell/phone within reach;with bed alarm set                   Time: 1884-1660 OT Time Calculation (min): 14 min Charges:  OT General Charges $OT Visit: 1 Visit OT Evaluation $OT Eval Low Complexity: 1 Low  Jaymien Landin OTR/L, MS Acute Rehabilitation Department Office# [phone]   Selinda Flavin 06/17/2023, 4:47 PM

## 2023-06-17 NOTE — Progress Notes (Signed)
Pharmacy Antibiotic Note  LAKOTA MARKGRAF is a 72 y.o. male admitted on 06/14/2023 with bacteremia.  Pharmacy has been consulted for vanc/cefepime dosing. 06/17/2023  Day # 4 of vancomycin & cefepime for 1 blood culture from SNF growing gram positive cocci in clusters WBC remains low but stable at 3.8. SCr 0.84 Afebrile Cultures no growth to date  Plan: Continue vancomycin 1500mg  IV q24 - goal AUC 400-550 Continue Cefepime 2g IV q8 Follow up culture results and narrow prn     Temp (24hrs), Avg:98.8 F (37.1 C), Min:98.2 F (36.8 C), Max:99.1 F (37.3 C)  Recent Labs  Lab 06/14/23 1246 06/14/23 1315 06/14/23 1530 06/15/23 0510 06/16/23 0536 06/17/23 0517  WBC 3.5*  --   --  2.7* 3.7* 3.8*  CREATININE 0.96  --   --  0.89 0.92 0.84  LATICACIDVEN  --  1.8 1.2  --   --   --     Estimated Creatinine Clearance: 72.1 mL/min (by C-G formula based on SCr of 0.84 mg/dL).    Allergies  Allergen Reactions   Atorvastatin Other (See Comments)    (LIPITOR) "Allergic," per [facility]   Rosuvastatin Other (See Comments)    (CRESTOR) Liver Disorder (adverse effects) and "Allergic," per [facility]   Septra [Sulfamethoxazole-Trimethoprim] Rash and Other (See Comments)    "Allergic," per [facility]    9/26 cefepime >> 9/26 vancomycin >>   9/26 BCx: NGTD PTA 9/1 CC UCx: > 100 K  pans sens PsA F  Thank you for allowing pharmacy to be a part of this patient's care.  Herby Abraham, Pharm.D Use secure chat for questions 06/17/2023 1:33 PM

## 2023-06-17 NOTE — Evaluation (Signed)
Physical Therapy Evaluation Patient Details Name: Andre [name] MRN: [id number] DOB: 1951-02-03 Today's Date: 06/17/2023  History of Present Illness  72 y/o admitted from SNF after episode of weakness and he felt like started to fall. PMH: HTN, hyperlipidemia and duodenal ulcer, acid reflux, depression, hypothyroidism, multiple myeloma, TIA   Patient is currently residing on the rehab unit of [facility].  Patient was in the hospital earlier this month for delirium.  Patient also has had issues with recent falls in the last couple of weeks.  Clinical Impression  Pt admitted with above diagnosis.  Pt currently with functional limitations due to the deficits listed below (see PT Problem List). Pt will benefit from acute skilled PT to increase their independence and safety with mobility to allow discharge.  Pt with decreased balance primarily with turning with RW.  Recommend 24/7 S due to decrease balance and history of falls.  O2 monitored and was in low 90s most of session on RA, with occasional drop to 88-89.  Reviewed incentive spirometer with him and daughter.         If plan is discharge home, recommend the following: A little help with walking and/or transfers;A little help with bathing/dressing/bathroom   Can travel by private vehicle   Yes    Equipment Recommendations None recommended by PT  Recommendations for Other Services       Functional Status Assessment       Precautions / Restrictions Precautions Precautions: Fall Precaution Comments: monitor O2 Restrictions Weight Bearing Restrictions: No      Mobility  Bed Mobility Overal bed mobility: Needs Assistance Bed Mobility: Supine to Sit       Sit to supine: Min assist, Mod assist   General bed mobility comments: Attempted rolling to side to get to EOB, but due to rib pain he rolled trunk back and then sat up straight with legs still turned. o2 90-91% on RA.    Transfers Overall transfer  level: Needs assistance Equipment used: Rolling walker (2 wheels) Transfers: Sit to/from Stand Sit to Stand: Min assist, +2 physical assistance           General transfer comment: MIN A of 2 to power up.  Needed cues for hand placement. Some initial dizziness that went away with standing.    Ambulation/Gait Ambulation/Gait assistance: Min assist, Mod assist Gait Distance (Feet): 160 Feet Assistive device: Rolling walker (2 wheels) Gait Pattern/deviations: Knee flexed in stance - right, Knee flexed in stance - left, Trunk flexed, Decreased stride length Gait velocity: decreased     General Gait Details: Cues for posture. Slow cadence initially which improved as gait progressed.  Slight steppage pattern at times. Poor balance with turning and body positioning within RW requiring MOD A for safety. o2 fluctutated with gait from 88-95% on RA with standing break midway through and cues for pursed lip breathing.  HR monitored and WNL.  Stairs            Wheelchair Mobility     Tilt Bed    Modified Rankin (Stroke Patients Only)       Balance Overall balance assessment: Needs assistance Sitting-balance support: Feet supported, Bilateral upper extremity supported Sitting balance-Leahy Scale: Fair Sitting balance - Comments: Posterior lean while PT donned socks.   Standing balance support: Reliant on assistive device for balance Standing balance-Leahy Scale: Poor  Pertinent Vitals/Pain Pain Assessment Pain Assessment: 0-10 Pain Score: 4  Pain Location: L ribs and neck Pain Descriptors / Indicators: Sore Pain Intervention(s): Monitored during session, Limited activity within patient's tolerance    Home Living Family/patient expects to be discharged to:: Skilled nursing facility                 Home Equipment: Rolling Walker (2 wheels) Additional Comments: Last month he has been at rehab at [facility], but has an ALF  apartment at [facility].    Prior Function Prior Level of Function : Needs assist       Physical Assist : Mobility (physical)     Mobility Comments: Pt reported use of a RW for ambulation.       Extremity/Trunk Assessment   Upper Extremity Assessment Upper Extremity Assessment: Defer to OT evaluation    Lower Extremity Assessment Lower Extremity Assessment: Generalized weakness    Cervical / Trunk Assessment Cervical / Trunk Assessment: Kyphotic  Communication   Communication Communication: No apparent difficulties Following commands: Follows one step commands consistently;Follows multi-step commands inconsistently Cueing Techniques: Verbal cues;Tactile cues  Cognition Arousal: Alert Behavior During Therapy: WFL for tasks assessed/performed Overall Cognitive Status: History of cognitive impairments - at baseline                       Memory: Decreased short-term memory       Problem Solving: Slow processing, Requires verbal cues General Comments: Oriented to person, place, and month.  Follows one step commands.        General Comments General comments (skin integrity, edema, etc.): At end he mentioned ribs will hurt when he coughs.  Instructed in bracing techniques.    Exercises     Assessment/Plan    PT Assessment Patient needs continued PT services  PT Problem List Decreased strength;Decreased range of motion;Decreased activity tolerance;Decreased balance;Decreased mobility;Decreased knowledge of use of DME;Decreased safety awareness       PT Treatment Interventions DME instruction;Gait training;Functional mobility training;Therapeutic activities;Therapeutic exercise;Balance training    PT Goals (Current goals can be found in the Care Plan section)  Acute Rehab PT Goals Patient Stated Goal: return to [facility] PT Goal Formulation: With patient/family Time For Goal Achievement: 07/01/23 Potential to Achieve Goals: Good    Frequency        Co-evaluation               AM-PAC PT "6 Clicks" Mobility  Outcome Measure Help needed turning from your back to your side while in a flat bed without using bedrails?: A Little Help needed moving from lying on your back to sitting on the side of a flat bed without using bedrails?: A Lot Help needed moving to and from a bed to a chair (including a wheelchair)?: A Little Help needed standing up from a chair using your arms (e.g., wheelchair or bedside chair)?: A Little Help needed to walk in hospital room?: A Little Help needed climbing 3-5 steps with a railing? : Total 6 Click Score: 15    End of Session Equipment Utilized During Treatment: Gait belt Activity Tolerance: Patient tolerated treatment well Patient left: in chair;with call bell/phone within reach;with chair alarm set;with family/visitor present Nurse Communication: Mobility status PT Visit Diagnosis: Unsteadiness on feet (R26.81);Muscle weakness (generalized) (M62.81);History of falling (Z91.81);Difficulty in walking, not elsewhere classified (R26.2)    Time: 7564-3329 PT Time Calculation (min) (ACUTE ONLY): 29 min   Charges:   PT Evaluation $PT Eval  Moderate Complexity: 1 Mod PT Treatments $Gait Training: 8-22 mins PT General Charges $$ ACUTE PT VISIT: 1 Visit         Andre [name]., PT Office [phone] Acute Rehab 06/17/2023   Enzo Montgomery 06/17/2023, 11:27 AM

## 2023-06-17 NOTE — Plan of Care (Signed)
  Problem: Safety: Goal: Ability to remain free from injury will improve Outcome: Progressing   Problem: Skin Integrity: Goal: Risk for impaired skin integrity will decrease Outcome: Progressing   Problem: Pain Managment: Goal: General experience of comfort will improve Outcome: Progressing   Problem: Coping: Goal: Level of anxiety will decrease Outcome: Progressing   Problem: Nutrition: Goal: Adequate nutrition will be maintained Outcome: Progressing   Problem: Clinical Measurements: Goal: Ability to maintain clinical measurements within normal limits will improve Outcome: Progressing

## 2023-06-18 DIAGNOSIS — B965 Pseudomonas (aeruginosa) (mallei) (pseudomallei) as the cause of diseases classified elsewhere: Secondary | ICD-10-CM | POA: Diagnosis not present

## 2023-06-18 DIAGNOSIS — S2242XD Multiple fractures of ribs, left side, subsequent encounter for fracture with routine healing: Secondary | ICD-10-CM | POA: Diagnosis not present

## 2023-06-18 DIAGNOSIS — K219 Gastro-esophageal reflux disease without esophagitis: Secondary | ICD-10-CM | POA: Diagnosis not present

## 2023-06-18 DIAGNOSIS — C9001 Multiple myeloma in remission: Secondary | ICD-10-CM

## 2023-06-18 DIAGNOSIS — C9000 Multiple myeloma not having achieved remission: Secondary | ICD-10-CM | POA: Diagnosis not present

## 2023-06-18 DIAGNOSIS — N390 Urinary tract infection, site not specified: Secondary | ICD-10-CM

## 2023-06-18 DIAGNOSIS — D696 Thrombocytopenia, unspecified: Secondary | ICD-10-CM

## 2023-06-18 DIAGNOSIS — I959 Hypotension, unspecified: Secondary | ICD-10-CM | POA: Diagnosis not present

## 2023-06-18 DIAGNOSIS — R109 Unspecified abdominal pain: Secondary | ICD-10-CM

## 2023-06-18 LAB — CBC WITH DIFFERENTIAL/PLATELET
Abs Immature Granulocytes: 0.04 10*3/uL (ref 0.00–0.07)
Basophils Absolute: 0 10*3/uL (ref 0.0–0.1)
Basophils Relative: 1 %
Eosinophils Absolute: 0.1 10*3/uL (ref 0.0–0.5)
Eosinophils Relative: 3 %
HCT: 31.9 % — ABNORMAL LOW (ref 39.0–52.0)
Hemoglobin: 10.4 g/dL — ABNORMAL LOW (ref 13.0–17.0)
Immature Granulocytes: 1 %
Lymphocytes Relative: 4 %
Lymphs Abs: 0.2 10*3/uL — ABNORMAL LOW (ref 0.7–4.0)
MCH: 36.5 pg — ABNORMAL HIGH (ref 26.0–34.0)
MCHC: 32.6 g/dL (ref 30.0–36.0)
MCV: 111.9 fL — ABNORMAL HIGH (ref 80.0–100.0)
Monocytes Absolute: 0.6 10*3/uL (ref 0.1–1.0)
Monocytes Relative: 16 %
Neutro Abs: 2.8 10*3/uL (ref 1.7–7.7)
Neutrophils Relative %: 75 %
Platelets: 23 10*3/uL — CL (ref 150–400)
RBC: 2.85 MIL/uL — ABNORMAL LOW (ref 4.22–5.81)
RDW: 23.6 % — ABNORMAL HIGH (ref 11.5–15.5)
WBC: 3.8 10*3/uL — ABNORMAL LOW (ref 4.0–10.5)
nRBC: 0 % (ref 0.0–0.2)

## 2023-06-18 LAB — BASIC METABOLIC PANEL
Anion gap: 9 (ref 5–15)
BUN: 21 mg/dL (ref 8–23)
CO2: 21 mmol/L — ABNORMAL LOW (ref 22–32)
Calcium: 9.2 mg/dL (ref 8.9–10.3)
Chloride: 106 mmol/L (ref 98–111)
Creatinine, Ser: 0.66 mg/dL (ref 0.61–1.24)
GFR, Estimated: >60 mL/min (ref >60)
Glucose, Bld: 91 mg/dL (ref 70–99)
Potassium: 3.9 mmol/L (ref 3.5–5.1)
Sodium: 136 mmol/L (ref 135–145)

## 2023-06-18 MED ORDER — ACETAMINOPHEN 500 MG PO TABS
1000.0000 mg | ORAL_TABLET | Freq: Three times a day (TID) | ORAL | Status: DC
  Administered 2023-06-18 – 2023-06-20 (×6): 1000 mg via ORAL
  Filled 2023-06-18 (×6): qty 2

## 2023-06-18 NOTE — Progress Notes (Signed)
IP PROGRESS NOTE  Subjective:   Mr. Hinchman is well-known to me with a history of multiple myeloma, status post CAR-T therapy in January.  He has been admitted on multiple occasions recently with Pseudomonas bacteremia and a UTI. He reports falling on 06/06/2023.  He has pain in the left abdomen following the fall.  He denies bleeding.  He was evaluated at [facility] on [facility]/07/2023 and was noted to have bruising at the sacrum.  A urinalysis was reported to be negative (I do not have the report ).  He had a low-grade fever and was restarted on ciprofloxacin 06/08/2023.  A blood culture returned positive for "gram-positive cocci in clusters ".  He was referred to the emergency room 06/14/2023  He continues to have pain at left chest wall.  No bleeding.  No other complaint.   Objective: Vital signs in last 24 hours: Blood pressure 130/68, pulse 63, temperature 97.9 F (36.6 C), temperature source Oral, resp. rate 19, SpO2 92%.  Intake/Output from previous day: 09/29 0701 - 09/30 0700 In: 1620 [P.O.:1020; IV Piggyback:600] Out: 2175 [Urine:2175]  Physical Exam:  HEENT: Mild thrush at the pharynx and buccal mucosa, small ulcer/ecchymosis at posterior rt. Buccal mucosa Lungs:clear bilaterally, no respiratory distress Cardiac: Regular rate and rhythm Abdomen: No HSM Extremities: No leg edema Musculoskeletal: Tender at left low chest wall, no ecchymosis   Portacath/PICC-without erythema  Lab Results: Recent Labs    06/17/23 0517 06/18/23 0434  WBC 3.8* 3.8*  HGB 10.1* 10.4*  HCT 30.8* 31.9*  PLT 23* 23*   Blood smear 06/15/2023: The platelets are decreased in number, no platelet clumps.  The majority the white cells are mature neutrophils.  No blasts or other young forms are seen.  The red cells are macrocytic, increased polychromasia, ovalocytes and a few teardrops.  No nucleated red cells. BMET Recent Labs    06/17/23 0517 06/18/23 0434  NA 136 136  K 4.2 3.9  CL 103 106  CO2  25 21*  GLUCOSE 90 91  BUN 17 21  CREATININE 0.84 0.66  CALCIUM 9.0 9.2    No results found for: "CEA1", "CEA", "WUJ811", "CA125"  Studies/Results: CT ABDOMEN PELVIS W CONTRAST  Result Date: 06/16/2023 CLINICAL DATA:  Acute, non localized abdominal pain. Recent fall. Clinical concern for intra-abdominal or retroperitoneal hemorrhage. History of multiple myeloma. EXAM: CT ABDOMEN AND PELVIS WITH CONTRAST TECHNIQUE: Multidetector CT imaging of the abdomen and pelvis was performed using the standard protocol following bolus administration of intravenous contrast. RADIATION DOSE REDUCTION: This exam was performed according to the departmental dose-optimization program which includes automated exposure control, adjustment of the mA and/or kV according to patient size and/or use of iterative reconstruction technique. CONTRAST:  OMNIPAQUE IOHEXOL 300 MG/ML  SOLN COMPARISON:  05/05/2023 FINDINGS: Lower chest: Interval minimal left pleural effusion. A large hiatal hernia is again demonstrated in the left lower chest medially. Mild right lower lobe atelectasis with mild progression. Stable mild left lower lobe atelectasis. Hepatobiliary: 3 mm gallstone in the gallbladder. No gallbladder thickening or pericholecystic fluid. Diffuse low density of the liver relative to the spleen. Pancreas: Unremarkable. No pancreatic ductal dilatation or surrounding inflammatory changes. Spleen: Normal in size without focal abnormality. Adrenals/Urinary Tract: Normal-appearing adrenal glands. Several small left renal simple cysts. These do not need imaging follow-up. Normal-appearing right kidney, ureters and urinary bladder. Stomach/Bowel: Large hiatal hernia. Mild sigmoid colon diverticulosis without evidence of diverticulitis. Prominent stool in the right colon and proximal hepatic flexure. Colon interposition on the  right. Normal-appearing appendix. Unremarkable small bowel. Vascular/Lymphatic: Atheromatous arterial  calcifications without aneurysm. No enlarged lymph nodes. Reproductive: Prostate is unremarkable. Other: Small to moderate-sized right and small left inguinal hernias. No intraperitoneal hemorrhage. Musculoskeletal: No retroperitoneal hemorrhage. Multiple yield, healing and acute left lower rib fractures. Mild-to-moderate right hip degenerative changes. Lumbar and lower thoracic spine degenerative changes. Stable old T12 compression deformity with no acute vertebral fractures or subluxations. IMPRESSION: 1. No intraperitoneal or retroperitoneal hemorrhage. 2. Multiple acute, subacute and healing left lower rib fractures. 3. Interval minimal left pleural effusion. 4. Large hiatal hernia. 5. Cholelithiasis. 6. Diffuse hepatic steatosis. 7. Mild sigmoid colon diverticulosis. 8. Small to moderate-sized right and small left inguinal hernias containing fat. Electronically Signed   By: Beckie Salts M.D.   On: 06/16/2023 15:48    Medications: I have reviewed the patient's current medications.  Assessment/Plan:  Multiple myeloma- IgG lambda  bortezomib (subcutaneously), lenalidomide, and dexamethasone, with repeat bone marrow biopsy May of 2012 showing 10% plasmacytosis   (2) high-dose chemotherapy with BCNU and melphalan at [facility], followed by stem cell rescue July of 2012   (3) on zoledronic acid started December of 2012, initially monthly, currently given every 3 months, most recent dose  12/07/2015   (4) low-dose lenalidomide resumed April 2013, interrupted several times.  Resumed again on 02/19/2013, ata dose of 5 mg daily, 21 days on and 7 days off, later further reduced to 7 days on, 7 days off   (5) CNS symptoms and abnormal brain MRI extensively evaluated by neurology with no definitive diagnosis established   (6) rising M spike noted June 2014 but did not meet criteria for carfilzomib study   (7) on lenalidomide 25 mg daily, 14 days on, 7 days off, starting 04/18/2013, interrupted December 2014  because of rash;             (a) resumed January 2015 at 10 mg/ day at 21 days on/ 7 days off             (b) starting 08/18/2014 decreased to 10 mg/ day 14 days on, 7 days off because of cytopenias             (c) as of February 2016 was on 5 mg daily 7 days on 7 days off             (d) lenalidomide discontinued December 2016 with evidence of disease progression   (8) transient global amnesia 05/29/2015, resolved without intervention   (9) starting PVD 10/25/2015 w ASA 325 thromboprophylaxis, valacyclovir prophylaxis, last dose 12/17/2015             (a) pomalidomide 4 mg/d days 1-14             (b) bortezomib sQ days 2,5,9,12 of each 21 day cycle             (c) dexamethasome 20 mg two days a week             (d) dexamethasone bortezomib and pomalidomide discontinued late December 2018 with poor tolerance   (10) metapneumovirus pneumonia April 2017             (a) completing course of steroids and week of bactrim mid April 2017   (11) status post second autologous transplant at [facility] 02/04/2016(preparatory regimen melphalan 200 mg/m)             (a) received twelve-month vaccinations 03/14/2017 (DPT, Haemophilus, Pneumovax 13, polio)             (  b) 14 months injections 05/04/2016 include DTaP, Hib conjugate, HepB energex B 20 mcg/ml, Prevnar 13             (c) 24 month vaccines due at [facility] June 2019   (12) maintenance therapy started November 2017, consisting of             (a) bortezomib 1.3 mg/M2 every 14 days, first dose 07/27/2016             (b) pomalidomide 1 mg days 1-21 Q28 days, started 07/19/2016             (c) zolendronate monthly started 07/27/2016 (previously Q 3 months) however patient unable to tolerate, and changed back to q3 months in April, 2018             (d) Bortezomib changed to monthly as of June 2018 because of tolerance issues, however discontinued after 09/14/2017 dose because of a rise in his M spike             (e) pomalidomide held after 10/18/2017 in  preparation for possible study at [facility] (venetoclax)--never resumed             (f) with numbers actually improved off treatment, resumed every 2-week bortezomib 12/25/2017             (g) changed to every 3-week bortezomib as of 03/17/2019             (h) briefly on weekly treatments times 22 October 2020 due to increase in M spike             (I) maintenance therapy discontinued with evidence of progression   (13) bortezomib/daratumumab/dexamethasone started 12/20/2020.             (a) Decadron dose dropped to 10 mg day of and day following treatment starting May 2022             (b) day 8 bortezomib omitted beginning with the June cycle per patient preference             (C) treatment changed to monthly daratumumab/Velcade/Decadron beginning 06/27/2021-patient decision (14) weekly Velcade/Cytoxan beginning 11/14/2021 (15) changed to Cytoxan/carfilzomib/Decadron 12/19/2021-treatment placed on hold 05/15/2022, carfilzomib 05/24/2022 and 05/31/2022 16.  Leukocyte a pheresis procedure at [facility] 06/15/2022 17.  Treatment resumed with Cytoxan/carfilzomib/Decadron 06/28/2022, last given 09/06/2022 18.  CAR-T 09/26/2022 with Carvykti 19.  CRS and ICANS following CAR-T therapy, status post a Decadron taper completed 10/08/2022 20.  Presentation to the emergency room 10/30/2022 with failure to thrive and a fall  21.  Pancytopenia secondary to multiple myeloma and CAR-T therapy-improved 22.  Bone marrow biopsy 11/13/2022-mildly hypocellular bone marrow with relative erythroid hyperplasia and decreased megakaryocytes; no plasma cells identified by differential count or CD138 immunohistochemical stain; flow cytometry negative for a clonal plasma cell population.   bone marrow biopsy 05/10/2023-mildly hypercellular marrow with mild decrease in megakaryocytes, plasma cells not increased, absent iron stores, 46 XY, negative myeloma FISH panel, negative myeloid panel 23.  Cognitive impairment predating CAR-T  therapy and worsened following CAR-T therapy 24.  Admission 05/05/2023 with Pseudomonas sepsis CTs chest, abdomen, and pelvis-right lung base atelectasis, no infiltrate 25.  Admission 05/20/2023 with hypoxia, altered mental status, and fever Infectious disease evaluation on hospital admission consistent with a urinary tract infection, urine culture positive for Pseudomonas aeruginosa 26.  Hypogammaglobulinemia secondary to multiple myeloma and CAR-T therapy 27.  Admission 06/14/2023 with severe anemia, hypotension, and report of a positive blood culture from 06/13/2023?  With gram-positive cocci in clusters, recent fall with a plain x-ray 06/14/2023 confirming left sixth and seventh rib fractures    Mr. Ickes has a history of multiple myeloma.  He is in clinical remission from myeloma after undergoing CAR-T therapy earlier this year.  He has persistent pancytopenia following the CAR-T therapy.  A recent bone marrow biopsy did not reveal evidence of myeloma or another infiltrative bone marrow process.  He appears to have a bone marrow failure syndrome following CAR-T therapy.  He was admitted with severe symptomatic anemia following a recent fall.  There was an acute drop in the hemoglobin prior to hospital admission.  He received 3 units rbcs during this admission. No bleeding source identified.  No apparent hematoma.  The hemoglobin is stable for Red cell transfusions.  Mr. Filsinger has a history of recurrent infections.  No apparent source for infection at present.  Blood culture prior to hospital admission returned positive for gram-positive cocci.  Repeat blood cultures are negative.  The outpatient blood culture may have been a contaminant.  Recommendations: Follow-up ID/sensitivity on outpatient blood culture Complete course of antibiotics per the medical service He appears stable for discharge from an oncology standpoint 4.   [facility]23        LOS: 4 days   Thornton Papas, MD   06/18/2023, 7:07 AM

## 2023-06-18 NOTE — Progress Notes (Signed)
PROGRESS NOTE    Andre [name]  NFA:[id number] DOB: Feb 13, 1951 DOA: 06/14/2023 PCP: [name], MD    Chief Complaint  Patient presents with   UTI    Brief Narrative:  Patient is a 72 year old gentleman history of multiple myeloma, chronic pancytopenia, recent hospitalization for Pseudomonas UTI leading to sepsis presented to the ED due to concern for positive blood culture, generalized weakness noted to have acute on chronic anemia and increased falls.  Oncology consulted and following.  Assessment & Plan:   Principal Problem:   Anemia associated with multiple myeloma treated with erythropoietin (HCC) Active Problems:   Hypothyroidism   Multiple myeloma not having achieved remission (HCC)   GERD (gastroesophageal reflux disease)   Pancytopenia (HCC)   Hypotension   Positive blood culture   Multiple rib fractures  #1 acute on chronic anemia/symptomatic anemia -Patient with history of multiple myeloma treated with erythropoietin history of anemia presenting with acute on chronic anemia with no overt bleeding and noted on presentation to the ED to be hypotensive. -BP improved with IV fluids. -Patient status post transfusion 2 units PRBCs.   -Hemoglobin currently at 10.4 from 6.6 on admission.  -CT abdomen and pelvis done negative for intra-abdominal retroperitoneal bleed.  -Hematology/oncology following.  2.  Chronic pancytopenia -Patient with a persistent pancytopenia per hematology/oncology following CAR-T therapy.   -Per oncology patient with recent bone marrow biopsy with no evidence of myeloma or other infiltrative bone marrow process.  -Patient with no overt bleeding. -Transfuse as needed. -Hematology/oncology following.  3.  Positive blood culture -It is noted that only 1 culture was obtained at patient's facility prior to admission and could likely be a contaminant however unable to rule out bacteremia, patient immunocompromised with multiple myeloma and  pancytopenia. -Repeat blood cultures pending with no growth to date x 4 days. -Continue empiric IV vancomycin and IV cefepime to complete a 5-day course of antibiotic treatment.  4.  Hypotension -Likely multifactorial secondary to hypovolemia and probable anemia with concern for possible sepsis due to 1 positive blood culture at facility. -Repeat blood cultures pending with no growth to date x 4 days. -Patient with no pulmonary symptoms. -Urinalysis nitrite negative, leukocytes negative. -BP noted to have responded to IV fluids. -Patient status post transfusion 2 units PRBCs.   -Follow.   5.  GERD -PPI.  6.  Hypothyroidism -Continue Synthroid.    7.  Multiple myeloma -Patient being followed by oncology during this hospitalization. -Outpatient follow-up with oncology.  8.  Left sixth and seventh rib fractures -Secondary to mechanical fall -Lidoderm patch.   -Place on scheduled Tylenol 1000 mg 3 times daily. -Incentive spirometry.    DVT prophylaxis: SCDs Code Status: Full Family Communication: Updated patient.  No family at bedside. Disposition: Back to facility tomorrow.  Status is: Inpatient Remains inpatient appropriate because: Severity of illness.   Consultants:  Hematology/oncology: Dr.Sherrill  Procedures:  Left-sided rib films 06/14/2023 Transfused 2 units PRBCs 06/15/2023 CT abdomen and pelvis 06/16/2023  Antimicrobials:  Anti-infectives (From admission, onward)    Start     Dose/Rate Route Frequency Ordered Stop   06/15/23 1800  vancomycin (VANCOREADY) IVPB 1500 mg/300 mL        1,500 mg 150 mL/hr over 120 Minutes Intravenous Every 24 hours 06/14/23 1449 06/18/23 2359   06/15/23 1000  dapsone tablet 100 mg        100 mg Oral Daily 06/14/23 1405     06/15/23 1000  valACYclovir (VALTREX) tablet 500 mg  500 mg Oral Daily 06/14/23 1405     06/14/23 1600  ceFEPIme (MAXIPIME) 2 g in sodium chloride 0.9 % 100 mL IVPB        2 g 200 mL/hr over 30 Minutes  Intravenous Every 8 hours 06/14/23 1449 06/18/23 2359   06/14/23 1500  vancomycin (VANCOREADY) IVPB 1750 mg/350 mL        1,750 mg 175 mL/hr over 120 Minutes Intravenous  Once 06/14/23 1449 06/14/23 1755         Subjective: Patient laying in bed, denies any chest pain or shortness of breath.  No abdominal pain.  Still with some left-sided rib pain that he feels is slowly improving.  Tolerating current diet.   Objective: Vitals:   06/17/23 1240 06/17/23 1506 06/17/23 1944 06/18/23 0356  BP: 95/64 110/62 131/67 130/68  Pulse: 78  76 63  Resp: 16  19 19   Temp: 98.9 F (37.2 C)  99.3 F (37.4 C) 97.9 F (36.6 C)  TempSrc: Oral  Oral Oral  SpO2: 93%  91% 92%    Intake/Output Summary (Last 24 hours) at 06/18/2023 1228 Last data filed at 06/18/2023 0831 Gross per 24 hour  Intake 1640 ml  Output 1950 ml  Net -310 ml   There were no vitals filed for this visit.  Examination:  General exam: NAD. Respiratory system: Lungs clear to auscultation bilaterally.  No wheezes, no crackles, no rhonchi.  Fair air movement.  Speaking in full sentences.  Left-sided rib TTP.  Cardiovascular system: RRR no murmurs rubs or gallops.  No JVD.  No lower extremity edema.  Gastrointestinal system: Abdomen is soft, nontender, nondistended, positive bowel sounds.  No rebound.  No guarding.  Central nervous system: Alert and oriented. No focal neurological deficits. Extremities: Symmetric 5 x 5 power. Skin: No rashes, lesions or ulcers Psychiatry: Judgement and insight appear normal. Mood & affect appropriate.     Data Reviewed: I have personally reviewed following labs and imaging studies  CBC: Recent Labs  Lab 06/14/23 1246 06/15/23 0510 06/15/23 1838 06/16/23 0536 06/17/23 0517 06/18/23 0434  WBC 3.5* 2.7*  --  3.7* 3.8* 3.8*  NEUTROABS 2.3  --   --  2.7 2.8 2.8  HGB 6.6* 6.7* 11.0* 9.7* 10.1* 10.4*  HCT 20.4* 21.2* 32.7* 29.0* 30.8* 31.9*  MCV 121.4* 122.5*  --  106.6* 109.2* 111.9*   PLT 29* 26*  --  23* 23* 23*    Basic Metabolic Panel: Recent Labs  Lab 06/14/23 1246 06/15/23 0510 06/16/23 0536 06/17/23 0517 06/18/23 0434  NA 136 137 137 136 136  K 3.9 4.5 3.9 4.2 3.9  CL 104 106 104 103 106  CO2 23 26 25 25  21*  GLUCOSE 111* 95 92 90 91  BUN 23 18 17 17 21   CREATININE 0.96 0.89 0.92 0.84 0.66  CALCIUM 8.5* 8.6* 8.8* 9.0 9.2  MG  --   --  2.3  --   --   PHOS  --   --  3.7  --   --     GFR: Estimated Creatinine Clearance: 75.7 mL/min (by C-G formula based on SCr of 0.66 mg/dL).  Liver Function Tests: Recent Labs  Lab 06/14/23 1246 06/16/23 0536  AST 27  --   ALT 24  --   ALKPHOS 69  --   BILITOT 0.6  --   PROT 5.6*  --   ALBUMIN 3.0* 3.0*    CBG: No results for input(s): "GLUCAP" in the last 168 hours.  Recent Results (from the past 240 hour(s))  Blood Culture (routine x 2)     Status: None (Preliminary result)   Collection Time: 06/14/23 12:46 PM   Specimen: BLOOD  Result Value Ref Range Status   Specimen Description   Final    BLOOD BLOOD RIGHT WRIST Performed at [facility], [address] [place], [place] [address]    Special Requests   Final    BOTTLES DRAWN AEROBIC AND ANAEROBIC Blood Culture results may not be optimal due to an excessive volume of blood received in culture bottles Performed at [facility], [address] [place], [place] [address]    Culture   Final    NO GROWTH 4 DAYS Performed at [facility], [address] [place], [place] [address]    Report Status PENDING  Incomplete  Blood Culture (routine x 2)     Status: None (Preliminary result)   Collection Time: 06/14/23  1:03 PM   Specimen: BLOOD  Result Value Ref Range Status   Specimen Description   Final    BLOOD BLOOD LEFT HAND Performed at [facility], [address] [place], [place] [address]    Special Requests   Final    BOTTLES DRAWN AEROBIC AND ANAEROBIC Blood Culture  adequate volume Performed at [facility], [address] [place], [place] [address]    Culture   Final    NO GROWTH 4 DAYS Performed at [facility], [address], [place], [place] [address]    Report Status PENDING  Incomplete         Radiology Studies: No results found.      Scheduled Meds:  sodium chloride   Intravenous Once   sodium chloride   Intravenous Once   calcium carbonate  1 tablet Oral BID   dapsone  100 mg Oral Daily   escitalopram  10 mg Oral Daily   feeding supplement (GLUCERNA SHAKE)  237 mL Oral TID BM   ferrous sulfate  325 mg Oral BID   levothyroxine  125 mcg Oral Q0600   lidocaine  1 patch Transdermal Daily   pantoprazole  40 mg Oral Daily   valACYclovir  500 mg Oral Daily   Continuous Infusions:  ceFEPime (MAXIPIME) IV 2 g (06/18/23 0830)   vancomycin 1,500 mg (06/17/23 1730)     LOS: 4 days    Time spent: 40 minutes    Ramiro Harvest, MD [facility]   To contact the attending provider between 7A-7P or the covering provider during after hours 7P-7A, please log into the web site [email or web address] and access using universal [place] password for that web site. If you do not have the password, please call the hospital operator.  06/18/2023, 12:28 PM

## 2023-06-18 NOTE — Progress Notes (Signed)
Mobility Specialist - Progress Note  Pre-mobility: 91% SpO2 During mobility: 87% SpO2 Post-mobility:  92% SPO2   06/18/23 0941  Mobility  Activity Ambulated with assistance in hallway  Level of Assistance Minimal assist, patient does 75% or more  Assistive Device Front wheel walker  Distance Ambulated (ft) 200 ft  Range of Motion/Exercises Active  Activity Response Tolerated fair  Mobility Referral Yes  $Mobility charge 1 Mobility  Mobility Specialist Start Time (ACUTE ONLY) 0920  Mobility Specialist Stop Time (ACUTE ONLY) 0940  Mobility Specialist Time Calculation (min) (ACUTE ONLY) 20 min   Pt was found in bed and agreeable to ambulate. Was min-A for STS and CG for ambulation. Stated feeling a little lightheaded during session. SPO2 checked to be 87%, took x1 standing rest break for ~84min and SPO2 increased to 95%. At EOS returned to recliner chair with all needs met. Call bell in reach and chair alarm on.  Billey Chang Mobility Specialist

## 2023-06-18 NOTE — TOC Progression Note (Signed)
Transition of Care [name]) - Progression Note    Patient Details  Name: AYMEN WIDRIG MRN: [id number] Date of Birth: [birth year]  Transition of Care [facility]) CM/SW Contact  Howell Rucks, RN Phone Number: 06/18/2023, 2:42 PM  Clinical Narrative:   FL2 completed. TOC will continue to follow for dc planning     Expected Discharge Plan: Skilled Nursing Facility Barriers to Discharge: Continued Medical Work up  Expected Discharge Plan and Services   Discharge Planning Services: CM Consult Post Acute Care Choice: Resumption of Svcs/PTA Provider Living arrangements for the past 2 months: Skilled Nursing Facility ([facility])                                       Social Determinants of Health (SDOH) Interventions SDOH Screenings   Food Insecurity: No Food Insecurity (06/15/2023)  Housing: Low Risk  (06/15/2023)  Transportation Needs: No Transportation Needs (06/15/2023)  Utilities: Not At Risk (06/15/2023)  Depression (PHQ2-9): Low Risk  (06/08/2022)  Tobacco Use: Medium Risk (06/14/2023)    Readmission Risk Interventions     No data to display

## 2023-06-18 NOTE — [place] FL2 (Signed)
[place] MEDICAID [id number] LEVEL OF CARE FORM     IDENTIFICATION  Patient Name: Andre [name] Birthdate: [birth year] Sex: male Admission Date (Current Location): 06/14/2023  [facility] and [place] Number:  Producer, television/film/video and Address:  [facility],  [address] [place], [place] [address]      Provider Number: [id number]  Attending Physician Name and Address:  Rodolph Bong, MD  Relative Name and Phone Number:  Battenfield,[name] (Spouse)  [phone] (Home Phone)    Current Level of Care: Hospital Recommended Level of Care: Skilled Nursing Facility Prior Approval Number:    Date Approved/Denied:   PASRR Number: [id number]  Discharge Plan: SNF    Current Diagnoses: Patient Active Problem List   Diagnosis Date Noted   Multiple rib fractures 06/16/2023   Hypotension 06/15/2023   Positive blood culture 06/15/2023   Anemia associated with multiple myeloma treated with erythropoietin (HCC) 06/14/2023   DNR (do not resuscitate)/DNI(Do Not Intubate) 05/20/2023   Severe sepsis (HCC) 05/20/2023   Acute cystitis without hematuria 05/20/2023   Acute metabolic encephalopathy 05/20/2023   Moderate-to-severe aortic regurgitation 05/20/2023   Macrocytic anemia 05/20/2023   Acute hypoxemic respiratory failure (HCC) 05/05/2023   Reduced mobility 10/13/2022   Self-care deficit 10/13/2022   Physical deconditioning 10/02/2022   History of engineered cell therapy infusion 09/26/2022   Meteorism 02/09/2022   Memory problem 08/30/2021   Hemorrhoids 11/20/2019   Neuropathy due to chemotherapeutic drug (HCC) 09/01/2019   Dry skin dermatitis 08/16/2016   Peripheral edema 04/26/2016   Bone marrow transplant status (HCC) 03/14/2016   Debility 02/26/2016   Back pain 02/26/2016   Patient in clinical research study 01/10/2016   GERD (gastroesophageal reflux disease)    Weakness 12/22/2015   General weakness    Multiple myeloma not having achieved remission (HCC)    Chronic  anemia    Constipation    Thrombocytopenia (HCC) 11/05/2015   Actinic keratosis 07/08/2015   Raynaud's phenomenon 09/17/2013   Cough 01/10/2013   Vitamin D deficiency 09/30/2012   Hypothyroidism 09/30/2012   Hypogonadism male 09/30/2012   Fatigue 09/27/2012   Insomnia 09/27/2012   Reactive depression (situational) 09/27/2012   Pancytopenia (HCC) 03/27/2012   HYPERLIPIDEMIA 05/30/2010   Anemia of chronic disease 05/30/2010   Asthma 05/30/2010   SCHATZKI'S RING 05/30/2010   Peptic ulcer disease 05/30/2010   HIATAL HERNIA 05/30/2010   SLEEP APNEA, MILD 05/30/2010   TRANSAMINASES, SERUM, ELEVATED 05/30/2010    Orientation RESPIRATION BLADDER Height & Weight     Self, Time, Situation, Place    Continent Weight:   Height:     BEHAVIORAL SYMPTOMS/MOOD NEUROLOGICAL BOWEL NUTRITION STATUS      Continent Diet (regular)  AMBULATORY STATUS COMMUNICATION OF NEEDS Skin   Limited Assist Verbally Other (Comment) (Eccymosis: Bilateral arm and feet, bilateral buttocks)                       Personal Care Assistance Level of Assistance  Bathing, Feeding, Dressing Bathing Assistance: Limited assistance Feeding assistance: Limited assistance Dressing Assistance: Limited assistance     Functional Limitations Info  Sight, Hearing, Speech Sight Info: Impaired (reading eyeglasses) Hearing Info: Adequate Speech Info: Adequate    SPECIAL CARE FACTORS FREQUENCY  PT (By licensed PT), OT (By licensed OT)     PT Frequency: 5x/wk OT Frequency: 5x/wk            Contractures Contractures Info: Present    Additional Factors Info  Code Status, Allergies, Psychotropic  Code Status Info: DNR Allergies Info: Atorvastatin, Rosuvastatin, Septra (Sulfamethoxazole-trimethoprim)  Isolation: None  Code Status: DNR-Limited  Advance Care Planning Activity   Ht: 5\' 7"  (1.702 m)  Wt: 78.7 kg  Admission Wt: --  Admission Cmt: None  Treatment Team Psychotropic Info: Lexapro 10mg  po daily          Current Medications (06/18/2023):  This is the current hospital active medication list Current Facility-Administered Medications  Medication Dose Route Frequency Provider Last Rate Last Admin   0.9 %  sodium chloride infusion (Manually program via Guardrails IV Fluids)   Intravenous Once Linwood Dibbles, MD       0.9 %  sodium chloride infusion (Manually program via Guardrails IV Fluids)   Intravenous Once Anthoney Harada, NP       acetaminophen (TYLENOL) tablet 650 mg  650 mg Oral Q6H PRN Kirby Crigler, Mir M, MD   650 mg at 06/17/23 1610   Or   acetaminophen (TYLENOL) suppository 650 mg  650 mg Rectal Q6H PRN Maryln Gottron, MD       calcium carbonate (TUMS - dosed in mg elemental calcium) chewable tablet 200 mg of elemental calcium  1 tablet Oral BID Rodolph Bong, MD   200 mg of elemental calcium at 06/18/23 1009   ceFEPIme (MAXIPIME) 2 g in sodium chloride 0.9 % 100 mL IVPB  2 g Intravenous Q8H Rodolph Bong, MD 200 mL/hr at 06/18/23 0830 2 g at 06/18/23 0830   dapsone tablet 100 mg  100 mg Oral Daily Kirby Crigler, Mir M, MD   100 mg at 06/18/23 1010   docusate sodium (COLACE) capsule 100 mg  100 mg Oral Daily PRN Kirby Crigler, Mir M, MD       escitalopram (LEXAPRO) tablet 10 mg  10 mg Oral Daily Kirby Crigler, Mir M, MD   10 mg at 06/18/23 1009   feeding supplement (GLUCERNA SHAKE) (GLUCERNA SHAKE) liquid 237 mL  237 mL Oral TID BM Kirby Crigler, Mir M, MD   237 mL at 06/18/23 1009   ferrous sulfate tablet 325 mg  325 mg Oral BID Kirby Crigler, Mir M, MD   325 mg at 06/18/23 1009   levothyroxine (SYNTHROID) tablet 125 mcg  125 mcg Oral Q0600 Maryln Gottron, MD   125 mcg at 06/18/23 0609   lidocaine (LIDODERM) 5 % 1 patch  1 patch Transdermal Daily Rodolph Bong, MD   1 patch at 06/17/23 9604   Muscle Rub CREA   Topical PRN Rodolph Bong, MD       ondansetron [facility]) tablet 4 mg  4 mg Oral Q6H PRN Kirby Crigler, Mir M, MD       Or   ondansetron [facility]) injection 4 mg  4 mg Intravenous  Q6H PRN Maryln Gottron, MD       Oral care mouth rinse  15 mL Mouth Rinse PRN Rodolph Bong, MD       pantoprazole (PROTONIX) EC tablet 40 mg  40 mg Oral Daily Kirby Crigler, Mir M, MD   40 mg at 06/18/23 1010   QUEtiapine (SEROQUEL) tablet 25 mg  25 mg Oral QHS PRN Rodolph Bong, MD       traZODone (DESYREL) tablet 25 mg  25 mg Oral QHS PRN Kirby Crigler, Mir M, MD   25 mg at 06/17/23 2130   valACYclovir (VALTREX) tablet 500 mg  500 mg Oral Daily Kirby Crigler, Mir M, MD   500 mg at 06/18/23 1009   vancomycin (VANCOREADY) IVPB 1500 mg/300 mL  1,500 mg Intravenous Q24H Rodolph Bong, MD 150 mL/hr at 06/17/23 1730 1,500 mg at 06/17/23 1730     Discharge Medications: Please see discharge summary for a list of discharge medications.  Relevant Imaging Results:  Relevant Lab Results:   Additional Information SSN: [id number]  Howell Rucks, RN

## 2023-06-18 NOTE — Plan of Care (Signed)
  Problem: Safety: Goal: Ability to remain free from injury will improve Outcome: Progressing   Problem: Skin Integrity: Goal: Risk for impaired skin integrity will decrease Outcome: Progressing   Problem: Pain Managment: Goal: General experience of comfort will improve Outcome: Progressing   Problem: Coping: Goal: Level of anxiety will decrease Outcome: Progressing   Problem: Clinical Measurements: Goal: Ability to maintain clinical measurements within normal limits will improve Outcome: Progressing   

## 2023-06-18 NOTE — Progress Notes (Signed)
SATURATION QUALIFICATIONS: (This note is used to comply with regulatory documentation for home oxygen) ? ?Patient Saturations on Room Air at Rest = 90% ? ?Patient Saturations on Room Air while Ambulating = 87% ? ?Patient Saturations on 2 Liters of oxygen while Ambulating = 94% ? ?Please briefly explain why patient needs home oxygen: ?

## 2023-06-19 ENCOUNTER — Telehealth: Payer: Self-pay | Admitting: *Deleted

## 2023-06-19 DIAGNOSIS — D801 Nonfamilial hypogammaglobulinemia: Secondary | ICD-10-CM

## 2023-06-19 DIAGNOSIS — C9001 Multiple myeloma in remission: Secondary | ICD-10-CM | POA: Diagnosis not present

## 2023-06-19 DIAGNOSIS — N390 Urinary tract infection, site not specified: Secondary | ICD-10-CM | POA: Diagnosis not present

## 2023-06-19 DIAGNOSIS — B965 Pseudomonas (aeruginosa) (mallei) (pseudomallei) as the cause of diseases classified elsewhere: Secondary | ICD-10-CM | POA: Diagnosis not present

## 2023-06-19 DIAGNOSIS — B370 Candidal stomatitis: Secondary | ICD-10-CM

## 2023-06-19 DIAGNOSIS — R109 Unspecified abdominal pain: Secondary | ICD-10-CM | POA: Diagnosis not present

## 2023-06-19 DIAGNOSIS — C9000 Multiple myeloma not having achieved remission: Secondary | ICD-10-CM | POA: Diagnosis not present

## 2023-06-19 DIAGNOSIS — R7881 Bacteremia: Secondary | ICD-10-CM | POA: Diagnosis not present

## 2023-06-19 DIAGNOSIS — B3789 Other sites of candidiasis: Secondary | ICD-10-CM

## 2023-06-19 DIAGNOSIS — K219 Gastro-esophageal reflux disease without esophagitis: Secondary | ICD-10-CM | POA: Diagnosis not present

## 2023-06-19 DIAGNOSIS — D61818 Other pancytopenia: Secondary | ICD-10-CM | POA: Diagnosis not present

## 2023-06-19 LAB — CULTURE, BLOOD (ROUTINE X 2)
Culture: NO GROWTH
Culture: NO GROWTH
Special Requests: ADEQUATE

## 2023-06-19 LAB — BASIC METABOLIC PANEL
Anion gap: 6 (ref 5–15)
BUN: 24 mg/dL — ABNORMAL HIGH (ref 8–23)
CO2: 22 mmol/L (ref 22–32)
Calcium: 9 mg/dL (ref 8.9–10.3)
Chloride: 106 mmol/L (ref 98–111)
Creatinine, Ser: 0.85 mg/dL (ref 0.61–1.24)
GFR, Estimated: >60 mL/min (ref >60)
Glucose, Bld: 89 mg/dL (ref 70–99)
Potassium: 3.9 mmol/L (ref 3.5–5.1)
Sodium: 134 mmol/L — ABNORMAL LOW (ref 135–145)

## 2023-06-19 LAB — CBC WITH DIFFERENTIAL/PLATELET
Abs Immature Granulocytes: 0.06 10*3/uL (ref 0.00–0.07)
Basophils Absolute: 0 10*3/uL (ref 0.0–0.1)
Basophils Relative: 0 %
Eosinophils Absolute: 0.1 10*3/uL (ref 0.0–0.5)
Eosinophils Relative: 4 %
HCT: 35.9 % — ABNORMAL LOW (ref 39.0–52.0)
Hemoglobin: 11.9 g/dL — ABNORMAL LOW (ref 13.0–17.0)
Immature Granulocytes: 2 %
Lymphocytes Relative: 6 %
Lymphs Abs: 0.2 10*3/uL — ABNORMAL LOW (ref 0.7–4.0)
MCH: 36.5 pg — ABNORMAL HIGH (ref 26.0–34.0)
MCHC: 33.1 g/dL (ref 30.0–36.0)
MCV: 110.1 fL — ABNORMAL HIGH (ref 80.0–100.0)
Monocytes Absolute: 0.4 10*3/uL (ref 0.1–1.0)
Monocytes Relative: 15 %
Neutro Abs: 2.1 10*3/uL (ref 1.7–7.7)
Neutrophils Relative %: 73 %
Platelets: 9 10*3/uL — CL (ref 150–400)
RBC: 3.26 MIL/uL — ABNORMAL LOW (ref 4.22–5.81)
RDW: 23.3 % — ABNORMAL HIGH (ref 11.5–15.5)
WBC: 2.8 10*3/uL — ABNORMAL LOW (ref 4.0–10.5)
nRBC: 0 % (ref 0.0–0.2)

## 2023-06-19 MED ORDER — LIDOCAINE VISCOUS HCL 2 % MT SOLN
15.0000 mL | Freq: Once | OROMUCOSAL | Status: DC
  Filled 2023-06-19: qty 15

## 2023-06-19 MED ORDER — NYSTATIN 100000 UNIT/ML MT SUSP
5.0000 mL | Freq: Four times a day (QID) | OROMUCOSAL | Status: DC
  Administered 2023-06-19 – 2023-06-20 (×5): 500000 [IU] via ORAL
  Filled 2023-06-19 (×5): qty 5

## 2023-06-19 MED ORDER — ALUM & MAG HYDROXIDE-SIMETH 200-200-20 MG/5ML PO SUSP: 30.0000 mL | Freq: Once | ORAL | Status: DC

## 2023-06-19 MED ORDER — CALCIUM CARBONATE ANTACID 500 MG PO CHEW
1.0000 | CHEWABLE_TABLET | Freq: Three times a day (TID) | ORAL | Status: DC
  Administered 2023-06-19 – 2023-06-20 (×3): 200 mg via ORAL
  Filled 2023-06-19 (×3): qty 1

## 2023-06-19 MED ORDER — FLUCONAZOLE 100 MG PO TABS
100.0000 mg | ORAL_TABLET | Freq: Every day | ORAL | Status: DC
  Administered 2023-06-19: 100 mg via ORAL
  Filled 2023-06-19: qty 1

## 2023-06-19 NOTE — Progress Notes (Signed)
IP PROGRESS NOTE  Subjective:   [name] complains of a dry mouth.  No bleeding.  Left chest wall discomfort has improved. Objective: Vital signs in last 24 hours: Blood pressure 123/65, pulse (!) 57, temperature 98.5 F (36.9 C), temperature source Oral, resp. rate 19, SpO2 92%.  Intake/Output from previous day: 09/30 0701 - 10/01 0700 In: 1520 [P.O.:1020; IV Piggyback:500] Out: 1525 [Urine:1525]  Physical Exam:  HEENT: Thrush over the tongue and bilateral buccal mucosa, small petechiae?  At the palate and right buccal mucosa  Abdomen: No HSM, nontender Extremities: No leg edema Musculoskeletal: No tenderness at the left anterior chest wall Skin: Small ecchymosis at the left forearm, petechiae at the lower legs    Lab Results: Recent Labs    06/18/23 0434 06/19/23 0514  WBC 3.8* 2.8*  HGB 10.4* 11.9*  HCT 31.9* 35.9*  PLT 23* 9*   Blood smear 06/15/2023: The platelets are decreased in number, no platelet clumps.  The majority the white cells are mature neutrophils.  No blasts or other young forms are seen.  The red cells are macrocytic, increased polychromasia, ovalocytes and a few teardrops.  No nucleated red cells. BMET Recent Labs    06/18/23 0434 06/19/23 0514  NA 136 134*  K 3.9 3.9  CL 106 106  CO2 21* 22  GLUCOSE 91 89  BUN 21 24*  CREATININE 0.66 0.85  CALCIUM 9.2 9.0    No results found for: "CEA1", "CEA", "BJY782", "CA125"  Studies/Results: No results found.  Medications: I have reviewed the patient's current medications.  Assessment/Plan:  Multiple myeloma- IgG lambda  bortezomib (subcutaneously), lenalidomide, and dexamethasone, with repeat bone marrow biopsy May of 2012 showing 10% plasmacytosis   (2) high-dose chemotherapy with BCNU and melphalan at [facility], followed by stem cell rescue July of 2012   (3) on zoledronic acid started December of 2012, initially monthly, currently given every 3 months, most recent dose  12/07/2015   (4)  low-dose lenalidomide resumed April 2013, interrupted several times.  Resumed again on 02/19/2013, ata dose of 5 mg daily, 21 days on and 7 days off, later further reduced to 7 days on, 7 days off   (5) CNS symptoms and abnormal brain MRI extensively evaluated by neurology with no definitive diagnosis established   (6) rising M spike noted June 2014 but did not meet criteria for carfilzomib study   (7) on lenalidomide 25 mg daily, 14 days on, 7 days off, starting 04/18/2013, interrupted December 2014 because of rash;             (a) resumed January 2015 at 10 mg/ day at 21 days on/ 7 days off             (b) starting 08/18/2014 decreased to 10 mg/ day 14 days on, 7 days off because of cytopenias             (c) as of February 2016 was on 5 mg daily 7 days on 7 days off             (d) lenalidomide discontinued December 2016 with evidence of disease progression   (8) transient global amnesia 05/29/2015, resolved without intervention   (9) starting PVD 10/25/2015 w ASA 325 thromboprophylaxis, valacyclovir prophylaxis, last dose 12/17/2015             (a) pomalidomide 4 mg/d days 1-14             (b) bortezomib sQ days 2,5,9,12 of each 21 day  cycle             (c) dexamethasome 20 mg two days a week             (d) dexamethasone bortezomib and pomalidomide discontinued late December 2018 with poor tolerance   (10) metapneumovirus pneumonia April 2017             (a) completing course of steroids and week of bactrim mid April 2017   (11) status post second autologous transplant at [facility] 02/04/2016(preparatory regimen melphalan 200 mg/m)             (a) received twelve-month vaccinations 03/14/2017 (DPT, Haemophilus, Pneumovax 13, polio)             (b) 14 months injections 05/04/2016 include DTaP, Hib conjugate, HepB energex B 20 mcg/ml, Prevnar 13             (c) 24 month vaccines due at [facility] June 2019   (12) maintenance therapy started November 2017, consisting of             (a) bortezomib  1.3 mg/M2 every 14 days, first dose 07/27/2016             (b) pomalidomide 1 mg days 1-21 Q28 days, started 07/19/2016             (c) zolendronate monthly started 07/27/2016 (previously Q 3 months) however patient unable to tolerate, and changed back to q3 months in April, 2018             (d) Bortezomib changed to monthly as of June 2018 because of tolerance issues, however discontinued after 09/14/2017 dose because of a rise in his M spike             (e) pomalidomide held after 10/18/2017 in preparation for possible study at [facility] (venetoclax)--never resumed             (f) with numbers actually improved off treatment, resumed every 2-week bortezomib 12/25/2017             (g) changed to every 3-week bortezomib as of 03/17/2019             (h) briefly on weekly treatments times 22 October 2020 due to increase in M spike             (I) maintenance therapy discontinued with evidence of progression   (13) bortezomib/daratumumab/dexamethasone started 12/20/2020.             (a) Decadron dose dropped to 10 mg day of and day following treatment starting May 2022             (b) day 8 bortezomib omitted beginning with the June cycle per patient preference             (C) treatment changed to monthly daratumumab/Velcade/Decadron beginning 06/27/2021-patient decision (14) weekly Velcade/Cytoxan beginning 11/14/2021 (15) changed to Cytoxan/carfilzomib/Decadron 12/19/2021-treatment placed on hold 05/15/2022, carfilzomib 05/24/2022 and 05/31/2022 16.  Leukocyte a pheresis procedure at [facility] 06/15/2022 17.  Treatment resumed with Cytoxan/carfilzomib/Decadron 06/28/2022, last given 09/06/2022 18.  CAR-T 09/26/2022 with Carvykti 19.  CRS and ICANS following CAR-T therapy, status post a Decadron taper completed 10/08/2022 20.  Presentation to the emergency room 10/30/2022 with failure to thrive and a fall  21.  Pancytopenia secondary to multiple myeloma and CAR-T therapy-improved 22.  Bone marrow biopsy  11/13/2022-mildly hypocellular bone marrow with relative erythroid hyperplasia and decreased megakaryocytes; no plasma cells identified by differential count or CD138 immunohistochemical  stain; flow cytometry negative for a clonal plasma cell population.   bone marrow biopsy 05/10/2023-mildly hypercellular marrow with mild decrease in megakaryocytes, plasma cells not increased, absent iron stores, 46 XY, negative myeloma FISH panel, negative myeloid panel 23.  Cognitive impairment predating CAR-T therapy and worsened following CAR-T therapy 24.  Admission 05/05/2023 with Pseudomonas sepsis CTs chest, abdomen, and pelvis-right lung base atelectasis, no infiltrate 25.  Admission 05/20/2023 with hypoxia, altered mental status, and fever Infectious disease evaluation on hospital admission consistent with a urinary tract infection, urine culture positive for Pseudomonas aeruginosa 26.  Hypogammaglobulinemia secondary to multiple myeloma and CAR-T therapy 27.  Admission 06/14/2023 with severe anemia, hypotension, and report of a positive blood culture from 06/13/2023?  With gram-positive cocci in clusters, recent fall with a plain x-ray 06/14/2023 confirming left sixth and seventh rib fractures    [name] has a history of multiple myeloma.  He is in clinical remission from myeloma after undergoing CAR-T therapy earlier this year.  He has persistent pancytopenia following the CAR-T therapy.  A recent bone marrow biopsy did not reveal evidence of myeloma or another infiltrative bone marrow process.  He appears to have a bone marrow failure syndrome following CAR-T therapy.  He was admitted with severe symptomatic anemia following a recent fall.  There was an acute drop in the hemoglobin prior to hospital admission.  He received 3 units rbcs during this admission. No bleeding source identified.  No apparent hematoma.  The hemoglobin is higher following the Red cell transfusions.  [name] has a history of  recurrent infections.  No apparent source for infection at present.  Blood culture prior to hospital admission returned positive for gram-positive cocci-Staph epidermidis, likely a skin contaminant.  Repeat blood cultures are negative.  The outpatient blood culture may have been a contaminant.  He has completed a course of antibiotics.  The platelet count is lower today.  He has no bleeding other than petechiae/ecchymoses.  The thrombocytopenia is likely related to bone marrow failure following CAR-T therapy.  There may be an immune component.  I will contact Dr. Marissa Calamity to get his recommendation regarding treatment.  Recommendations: Diflucan for oral candidiasis Repeat CBC in a.m. 06/20/2023, platelet transfusion if the platelets remain at less than 10,000 I will contact Dr. Marissa Calamity to discuss management of the severe thrombocytopenia       LOS: 5 days   Thornton Papas, MD   06/19/2023, 8:07 AM

## 2023-06-19 NOTE — Progress Notes (Signed)
PROGRESS NOTE    Andre [name]  FGH:[id number] DOB: 1951/03/17 DOA: 06/14/2023 PCP: [name], MD    Chief Complaint  Patient presents with   UTI    Brief Narrative:  Patient is a 72 year old gentleman history of multiple myeloma, chronic pancytopenia, recent hospitalization for Pseudomonas UTI leading to sepsis presented to the ED due to concern for positive blood culture, generalized weakness noted to have acute on chronic anemia and increased falls.  Oncology consulted and following.  Assessment & Plan:   Principal Problem:   Anemia associated with multiple myeloma treated with erythropoietin (HCC) Active Problems:   Hypothyroidism   Multiple myeloma not having achieved remission (HCC)   GERD (gastroesophageal reflux disease)   Pancytopenia (HCC)   Hypotension   Positive blood culture   Multiple rib fractures   Oral thrush  #1 acute on chronic anemia/symptomatic anemia -Patient with history of multiple myeloma treated with erythropoietin history of anemia presenting with acute on chronic anemia with no overt bleeding and noted on presentation to the ED to be hypotensive. -BP improved with IV fluids. -Patient status post transfusion 2 units PRBCs.   -Hemoglobin currently at 11.9 from 6.6 on admission.  -CT abdomen and pelvis done negative for intra-abdominal retroperitoneal bleed.  -Hematology/oncology following.  2.  Chronic pancytopenia -Patient with a persistent pancytopenia per hematology/oncology following CAR-T therapy.   -Per oncology patient with recent bone marrow biopsy with no evidence of myeloma or other infiltrative bone marrow process.  -Patient with significant drop in platelet count currently at 9K from 23K. -Patient with no overt bleeding. -Patient being followed by hematology/oncology who are recommending repeat CBC in the morning of 10-20 24 and if platelet count remains less than 10K to transfuse platelets. -Oncology to contact Dr. Dorothea Ogle to  discuss management of severe thrombocytopenia. -Hematology/oncology following and appreciate input and recommendations.  3.  Positive blood culture likely contaminant -It is noted that only 1 culture was obtained at patient's facility prior to admission and could likely be a contaminant however unable to rule out bacteremia, patient immunocompromised with multiple myeloma and pancytopenia. -Repeat blood cultures with no growth to date x 5 days.   -Patient afebrile.   -Status post 5 days IV vancomycin IV cefepime.   -No further antibiotics needed at this time.    4.  Hypotension -Likely multifactorial secondary to hypovolemia and probable anemia with concern for possible sepsis due to 1 positive blood culture at facility. -Repeat blood cultures negative x 5 days. -Patient afebrile. -Patient with no pulmonary symptoms. -Urinalysis nitrite negative, leukocytes negative. -BP noted to have responded to IV fluids. -Patient status post transfusion 2 units PRBCs.   -Follow.   5.  GERD -PPI.  6.  Hypothyroidism -Synthroid.   7.  Multiple myeloma -Patient being followed by oncology during this hospitalization. -Outpatient follow-up with oncology.  8.  Left sixth and seventh rib fractures -Secondary to mechanical fall -Continue Lidoderm patch, scheduled Tylenol.   -Incentive spirometry.  9.  Oral thrush -Place on nystatin swish and swallow. -Continue Diflucan.   DVT prophylaxis: SCDs Code Status: Full Family Communication: Updated patient.  No family at bedside. Disposition: Back to facility once thrombocytopenia has stabilized and cleared by hematology/oncology.    Status is: Inpatient Remains inpatient appropriate because: Severity of illness.   Consultants:  Hematology/oncology: Dr.Sherrill  Procedures:  Left-sided rib films 06/14/2023 Transfused 2 units PRBCs 06/15/2023 CT abdomen and pelvis 06/16/2023  Antimicrobials:  Anti-infectives (From admission, onward)     Start  Dose/Rate Route Frequency Ordered Stop   06/19/23 1000  fluconazole (DIFLUCAN) tablet 100 mg        100 mg Oral Daily 06/19/23 0815 06/24/23 0959   06/15/23 1800  vancomycin (VANCOREADY) IVPB 1500 mg/300 mL        1,500 mg 150 mL/hr over 120 Minutes Intravenous Every 24 hours 06/14/23 1449 06/18/23 1924   06/15/23 1000  dapsone tablet 100 mg        100 mg Oral Daily 06/14/23 1405     06/15/23 1000  valACYclovir (VALTREX) tablet 500 mg        500 mg Oral Daily 06/14/23 1405     06/14/23 1600  ceFEPIme (MAXIPIME) 2 g in sodium chloride 0.9 % 100 mL IVPB        2 g 200 mL/hr over 30 Minutes Intravenous Every 8 hours 06/14/23 1449 06/18/23 2359   06/14/23 1500  vancomycin (VANCOREADY) IVPB 1750 mg/350 mL        1,750 mg 175 mL/hr over 120 Minutes Intravenous  Once 06/14/23 1449 06/14/23 1755         Subjective: Noted to have just finished ambulating.  Denies any chest pain, no shortness of breath, no abdominal pain.  Still with some left-sided rib pain that is improving.  Denies any overt bleeding.   Objective: Vitals:   06/18/23 2050 06/19/23 0433 06/19/23 0433 06/19/23 1331  BP:   123/65 115/72  Pulse:   (!) 57 80  Resp:   19 17  Temp:  98.5 F (36.9 C)  98.6 F (37 C)  TempSrc:  Oral  Oral  SpO2: 94%  92% 97%    Intake/Output Summary (Last 24 hours) at 06/19/2023 1913 Last data filed at 06/19/2023 1803 Gross per 24 hour  Intake 1160 ml  Output 1500 ml  Net -340 ml   There were no vitals filed for this visit.  Examination:  General exam: NAD. Respiratory system: CTAB.  No wheezes, no crackles, no rales decreased tenderness to palpation in the left ribs.  Cardiovascular system: Regular rate rhythm no murmurs rubs or gallops.  No JVD.  No lower extremity edema. Gastrointestinal system: Abdomen is soft, nontender, nondistended, positive bowel sounds.  No rebound.  No guarding.  Central nervous system: Alert and oriented. No focal neurological  deficits. Extremities: Symmetric 5 x 5 power. Skin: No rashes, lesions or ulcers Psychiatry: Judgement and insight appear normal. Mood & affect appropriate.     Data Reviewed: I have personally reviewed following labs and imaging studies  CBC: Recent Labs  Lab 06/14/23 1246 06/15/23 0510 06/15/23 1838 06/16/23 0536 06/17/23 0517 06/18/23 0434 06/19/23 0514  WBC 3.5* 2.7*  --  3.7* 3.8* 3.8* 2.8*  NEUTROABS 2.3  --   --  2.7 2.8 2.8 2.1  HGB 6.6* 6.7* 11.0* 9.7* 10.1* 10.4* 11.9*  HCT 20.4* 21.2* 32.7* 29.0* 30.8* 31.9* 35.9*  MCV 121.4* 122.5*  --  106.6* 109.2* 111.9* 110.1*  PLT 29* 26*  --  23* 23* 23* 9*    Basic Metabolic Panel: Recent Labs  Lab 06/15/23 0510 06/16/23 0536 06/17/23 0517 06/18/23 0434 06/19/23 0514  NA 137 137 136 136 134*  K 4.5 3.9 4.2 3.9 3.9  CL 106 104 103 106 106  CO2 26 25 25  21* 22  GLUCOSE 95 92 90 91 89  BUN 18 17 17 21  24*  CREATININE 0.89 0.92 0.84 0.66 0.85  CALCIUM 8.6* 8.8* 9.0 9.2 9.0  MG  --  2.3  --   --   --  PHOS  --  3.7  --   --   --     GFR: Estimated Creatinine Clearance: 71.3 mL/min (by C-G formula based on SCr of 0.85 mg/dL).  Liver Function Tests: Recent Labs  Lab 06/14/23 1246 06/16/23 0536  AST 27  --   ALT 24  --   ALKPHOS 69  --   BILITOT 0.6  --   PROT 5.6*  --   ALBUMIN 3.0* 3.0*    CBG: No results for input(s): "GLUCAP" in the last 168 hours.   Recent Results (from the past 240 hour(s))  Blood Culture (routine x 2)     Status: None   Collection Time: 06/14/23 12:46 PM   Specimen: BLOOD  Result Value Ref Range Status   Specimen Description   Final    BLOOD BLOOD RIGHT WRIST Performed at [facility], [address], [place], [place] [address]    Special Requests   Final    BOTTLES DRAWN AEROBIC AND ANAEROBIC Blood Culture results may not be optimal due to an excessive volume of blood received in culture bottles Performed at [facility], [address], [place], [place] [address]    Culture   Final    NO GROWTH 5 DAYS Performed at [facility], [address], [place], [place] [address]    Report Status 06/19/2023 FINAL  Final  Blood Culture (routine x 2)     Status: None   Collection Time: 06/14/23  1:03 PM   Specimen: BLOOD  Result Value Ref Range Status   Specimen Description   Final    BLOOD BLOOD LEFT HAND Performed at [facility], [address], [place], [place] [address]    Special Requests   Final    BOTTLES DRAWN AEROBIC AND ANAEROBIC Blood Culture adequate volume Performed at [facility], [address] [place], [place] [address]    Culture   Final    NO GROWTH 5 DAYS Performed at [facility], [address], [place], [place] [address]    Report Status 06/19/2023 FINAL  Final         Radiology Studies: No results found.      Scheduled Meds:  sodium chloride   Intravenous Once   sodium chloride   Intravenous Once   acetaminophen  1,000 mg Oral Q8H   alum & mag hydroxide-simeth  30 mL Oral Once   And   lidocaine  15 mL Oral Once   calcium carbonate  1 tablet Oral TID   dapsone  100 mg Oral Daily   escitalopram  10 mg Oral Daily   feeding supplement (GLUCERNA SHAKE)  237 mL Oral TID BM   ferrous sulfate  325 mg Oral BID   fluconazole  100 mg Oral Daily   levothyroxine  125 mcg Oral Q0600   lidocaine  1 patch Transdermal Daily   nystatin  5 mL Oral QID   pantoprazole  40 mg Oral Daily   valACYclovir  500 mg Oral Daily   Continuous Infusions:     LOS: 5 days    Time spent: 40 minutes    Ramiro Harvest, MD [facility]   To contact the attending provider between 7A-7P or the covering provider during after hours 7P-7A, please log into the web site [email or web address] and access using universal  password for that web site. If you do not have the password, please call the hospital operator.  06/19/2023, 7:13 PM

## 2023-06-19 NOTE — Telephone Encounter (Signed)
Andre [name] called to inquire of Dr. Kalman Drape thoughts after seeing Andre [name] today? Informed him of probable bone marrow failure following CART-T therapy. He plans to transfuse platelets tomorrow if count remains <10.000 and plans on calling Dr. Marissa Calamity for his thoughts on treating him. She requests to be updated after he speaks with Dr. Marissa Calamity.

## 2023-06-19 NOTE — Progress Notes (Signed)
Physical Therapy Treatment Patient Details Name: Andre [name] MRN: [id number] DOB: 12-03-1950 Today's Date: 06/19/2023   History of Present Illness 72 y/o admitted from SNF after episode of weakness and he felt like started to fall. PMH: HTN, hyperlipidemia and duodenal ulcer, acid reflux, depression, hypothyroidism, multiple myeloma, TIA   Patient is currently residing on the rehab unit [facility].  Patient was in the hospital earlier this month for delirium.  Patient also has had issues with recent falls in the last couple of weeks.    PT Comments  Pt agreeable to mobilizing after he finished eating. O2 91% on RA during session-reapplied [place] O2 end of session. He tolerated activity well. Plan is for pt to return to SNF once medically ready.    If plan is discharge home, recommend the following: A little help with walking and/or transfers;A little help with bathing/dressing/bathroom   Can travel by private vehicle     Yes  Equipment Recommendations  None recommended by PT    Recommendations for Other Services       Precautions / Restrictions Precautions Precautions: Fall Precaution Comments: monitor O2 Restrictions Weight Bearing Restrictions: No     Mobility  Bed Mobility Overal bed mobility: Needs Assistance Bed Mobility: Supine to Sit     Supine to sit: Supervision     General bed mobility comments: Supv for safety. Increased time.    Transfers Overall transfer level: Needs assistance Equipment used: Rolling walker (2 wheels) Transfers: Sit to/from Stand Sit to Stand: Min assist           General transfer comment: Small amount of assist to power up and steady. Cues for safety, hand placement. Increased time.    Ambulation/Gait Ambulation/Gait assistance: Min assist Gait Distance (Feet): 150 Feet Assistive device: Rolling walker (2 wheels) Gait Pattern/deviations: Step-through pattern, Decreased stride length       General Gait  Details: Assist to steady intermittently, especially with turns/changes in direction. O2 91% on RA, dyspnea 2/4. Tolerated distance well.   Stairs             Wheelchair Mobility     Tilt Bed    Modified Rankin (Stroke Patients Only)       Balance Overall balance assessment: Needs assistance         Standing balance support: During functional activity, Bilateral upper extremity supported, Reliant on assistive device for balance Standing balance-Leahy Scale: Poor                              Cognition Arousal: Alert Behavior During Therapy: WFL for tasks assessed/performed Overall Cognitive Status: History of cognitive impairments - at baseline Area of Impairment: Memory, Problem solving, Awareness                     Memory: Decreased short-term memory       Problem Solving: Requires verbal cues, Slow processing          Exercises      General Comments        Pertinent Vitals/Pain Pain Assessment Pain Assessment: No/denies pain    Home Living                          Prior Function            PT Goals (current goals can now be found in the care plan section) Progress  towards PT goals: Progressing toward goals    Frequency    Min 1X/week      PT Plan      Co-evaluation              AM-PAC PT "6 Clicks" Mobility   Outcome Measure  Help needed turning from your back to your side while in a flat bed without using bedrails?: None Help needed moving from lying on your back to sitting on the side of a flat bed without using bedrails?: None Help needed moving to and from a bed to a chair (including a wheelchair)?: A Little Help needed standing up from a chair using your arms (e.g., wheelchair or bedside chair)?: A Little Help needed to walk in hospital room?: A Little Help needed climbing 3-5 steps with a railing? : A Lot 6 Click Score: 19    End of Session Equipment Utilized During Treatment: Gait  belt Activity Tolerance: Patient tolerated treatment well Patient left: in chair;with call bell/phone within reach;with chair alarm set   PT Visit Diagnosis: Unsteadiness on feet (R26.81);Muscle weakness (generalized) (M62.81);History of falling (Z91.81);Difficulty in walking, not elsewhere classified (R26.2)     Time: 1610-9604 PT Time Calculation (min) (ACUTE ONLY): 25 min  Charges:    $Gait Training: 23-37 mins PT General Charges $$ ACUTE PT VISIT: 1 Visit                        Faye Ramsay, PT Acute Rehabilitation  Office: [phone]

## 2023-06-19 NOTE — TOC Progression Note (Addendum)
Transition of Care [facility]) - Progression Note    Patient Details  Name: Andre [name] MRN: [id number] Date of Birth: 1951/06/22  [facility] CM/SW Contact  Howell Rucks, RN Phone Number: 06/19/2023, 11:39 AM  Clinical Narrative:  Call to Donna/admissions coordinator at [facility], confirmed pt was short term rehab level of care prior to admission and facility is able to accept back at short term rehab. Informed Lupita Leash of home 02 order. Lupita Leash works Animator working on LTC level of care. FL2 completed. TOC will continue to follow.       Expected Discharge Plan: Skilled Nursing Facility Barriers to Discharge: Continued Medical Work up  Expected Discharge Plan and Services   Discharge Planning Services: CM Consult Post Acute Care Choice: Resumption of Svcs/PTA Provider Living arrangements for the past 2 months: Skilled Nursing Facility ([facility])                                       Social Determinants of Health (SDOH) Interventions SDOH Screenings   Food Insecurity: No Food Insecurity (06/15/2023)  Housing: Low Risk  (06/15/2023)  Transportation Needs: No Transportation Needs (06/15/2023)  Utilities: Not At Risk (06/15/2023)  Depression (PHQ2-9): Low Risk  (06/08/2022)  Tobacco Use: Medium Risk (06/14/2023)    Readmission Risk Interventions     No data to display

## 2023-06-20 ENCOUNTER — Other Ambulatory Visit: Payer: Self-pay | Admitting: *Deleted

## 2023-06-20 ENCOUNTER — Inpatient Hospital Stay (HOSPITAL_COMMUNITY): Payer: Medicare Other

## 2023-06-20 DIAGNOSIS — C9000 Multiple myeloma not having achieved remission: Secondary | ICD-10-CM | POA: Diagnosis not present

## 2023-06-20 DIAGNOSIS — B370 Candidal stomatitis: Secondary | ICD-10-CM | POA: Diagnosis not present

## 2023-06-20 DIAGNOSIS — K219 Gastro-esophageal reflux disease without esophagitis: Secondary | ICD-10-CM | POA: Diagnosis not present

## 2023-06-20 DIAGNOSIS — N390 Urinary tract infection, site not specified: Secondary | ICD-10-CM | POA: Diagnosis not present

## 2023-06-20 DIAGNOSIS — B965 Pseudomonas (aeruginosa) (mallei) (pseudomallei) as the cause of diseases classified elsewhere: Secondary | ICD-10-CM | POA: Diagnosis not present

## 2023-06-20 DIAGNOSIS — R109 Unspecified abdominal pain: Secondary | ICD-10-CM | POA: Diagnosis not present

## 2023-06-20 DIAGNOSIS — C9001 Multiple myeloma in remission: Secondary | ICD-10-CM | POA: Diagnosis not present

## 2023-06-20 DIAGNOSIS — D61818 Other pancytopenia: Secondary | ICD-10-CM | POA: Diagnosis not present

## 2023-06-20 LAB — CBC WITH DIFFERENTIAL/PLATELET
Abs Immature Granulocytes: 0.06 10*3/uL (ref 0.00–0.07)
Basophils Absolute: 0 10*3/uL (ref 0.0–0.1)
Basophils Relative: 1 %
Eosinophils Absolute: 0.1 10*3/uL (ref 0.0–0.5)
Eosinophils Relative: 4 %
HCT: 32.9 % — ABNORMAL LOW (ref 39.0–52.0)
Hemoglobin: 10.7 g/dL — ABNORMAL LOW (ref 13.0–17.0)
Immature Granulocytes: 2 %
Lymphocytes Relative: 8 %
Lymphs Abs: 0.2 10*3/uL — ABNORMAL LOW (ref 0.7–4.0)
MCH: 36.4 pg — ABNORMAL HIGH (ref 26.0–34.0)
MCHC: 32.5 g/dL (ref 30.0–36.0)
MCV: 111.9 fL — ABNORMAL HIGH (ref 80.0–100.0)
Monocytes Absolute: 0.4 10*3/uL (ref 0.1–1.0)
Monocytes Relative: 14 %
Neutro Abs: 2 10*3/uL (ref 1.7–7.7)
Neutrophils Relative %: 71 %
Platelets: 24 10*3/uL — CL (ref 150–400)
RBC: 2.94 MIL/uL — ABNORMAL LOW (ref 4.22–5.81)
RDW: 22.5 % — ABNORMAL HIGH (ref 11.5–15.5)
WBC: 2.8 10*3/uL — ABNORMAL LOW (ref 4.0–10.5)
nRBC: 0 % (ref 0.0–0.2)

## 2023-06-20 LAB — COMPREHENSIVE METABOLIC PANEL
ALT: 21 U/L (ref 0–44)
AST: 29 U/L (ref 15–41)
Albumin: 3.2 g/dL — ABNORMAL LOW (ref 3.5–5.0)
Alkaline Phosphatase: 83 U/L (ref 38–126)
Anion gap: 8 (ref 5–15)
BUN: 21 mg/dL (ref 8–23)
CO2: 24 mmol/L (ref 22–32)
Calcium: 9.2 mg/dL (ref 8.9–10.3)
Chloride: 105 mmol/L (ref 98–111)
Creatinine, Ser: 0.89 mg/dL (ref 0.61–1.24)
GFR, Estimated: >60 mL/min (ref >60)
Glucose, Bld: 86 mg/dL (ref 70–99)
Potassium: 4.2 mmol/L (ref 3.5–5.1)
Sodium: 137 mmol/L (ref 135–145)
Total Bilirubin: 0.4 mg/dL (ref 0.3–1.2)
Total Protein: 6.1 g/dL — ABNORMAL LOW (ref 6.5–8.1)

## 2023-06-20 LAB — MAGNESIUM: Magnesium: 2.4 mg/dL (ref 1.7–2.4)

## 2023-06-20 MED ORDER — FLUCONAZOLE 100 MG PO TABS
200.0000 mg | ORAL_TABLET | Freq: Every day | ORAL | Status: DC
  Administered 2023-06-20: 200 mg via ORAL
  Filled 2023-06-20: qty 2

## 2023-06-20 MED ORDER — NYSTATIN 100000 UNIT/ML MT SUSP: 5.0000 mL | Freq: Four times a day (QID) | OROMUCOSAL | 0 refills | Status: DC

## 2023-06-20 MED ORDER — ONDANSETRON HCL 4 MG PO TABS: 4.0000 mg | ORAL_TABLET | Freq: Four times a day (QID) | ORAL | 0 refills | Status: DC | PRN

## 2023-06-20 MED ORDER — QUETIAPINE FUMARATE 25 MG PO TABS: 25.0000 mg | ORAL_TABLET | Freq: Every evening | ORAL | 0 refills | Status: DC | PRN

## 2023-06-20 MED ORDER — GLUCERNA SHAKE PO LIQD: 237.0000 mL | Freq: Three times a day (TID) | ORAL | 0 refills | Status: DC

## 2023-06-20 MED ORDER — ROMIPLOSTIM 125 MCG ~~LOC~~ SOLR
1.0000 ug/kg | Freq: Once | SUBCUTANEOUS | Status: AC
  Administered 2023-06-20: 80 ug via SUBCUTANEOUS
  Filled 2023-06-20: qty 0.16

## 2023-06-20 MED ORDER — MUSCLE RUB 10-15 % EX CREA: 1.0000 | TOPICAL_CREAM | CUTANEOUS | 0 refills | Status: DC | PRN

## 2023-06-20 MED ORDER — FLUCONAZOLE 200 MG PO TABS: 200.0000 mg | ORAL_TABLET | Freq: Every day | ORAL | 0 refills | Status: DC

## 2023-06-20 MED ORDER — ACETAMINOPHEN 500 MG PO TABS: 1000.0000 mg | ORAL_TABLET | Freq: Three times a day (TID) | ORAL | 0 refills | Status: DC

## 2023-06-20 NOTE — Progress Notes (Signed)
RN able to give report to Danielle at [facility] facility at this time.

## 2023-06-20 NOTE — Progress Notes (Signed)
Per Dr. Truett Perna: Needs CBC and Nplate on 10/9 and 10/18. Scheduling message sent. Email to [facility] pathology requesting MDS FISH panel on his 05/10/23 bone marrow biopsy [id number]. Dr. Truett Perna discussed case w/wife after speaking with Dr. Marissa Calamity.

## 2023-06-20 NOTE — Plan of Care (Signed)
  Problem: Nutrition: Goal: Adequate nutrition will be maintained Outcome: Adequate for Discharge   Problem: Elimination: Goal: Will not experience complications related to bowel motility Outcome: Adequate for Discharge

## 2023-06-20 NOTE — Progress Notes (Signed)
IP PROGRESS NOTE  Subjective:   Andre [name] reports his mouth feels better.The left chest wall pain has improved. No bleeding. Objective: Vital signs in last 24 hours: Blood pressure 137/72, pulse (!) 59, temperature 97.9 F (36.6 C), temperature source Oral, resp. rate 16, SpO2 95%.  Intake/Output from previous day: 10/01 0701 - 10/02 0700 In: 360 [P.O.:360] Out: 1000 [Urine:1000]  Physical Exam:  HEENT: Thrush at the tongue and buccal mucosa has improved.  Single petechae at rt. Buccal mucose.  Abdomen: No HSM, nontender Extremities: No leg edema Musculoskeletal: No tenderness at the left anterior chest wall Skin: Small ecchymosis at the left forearm, petechiae at the lower legs    Lab Results: Recent Labs    06/19/23 0514 06/20/23 0458  WBC 2.8* 2.8*  HGB 11.9* 10.7*  HCT 35.9* 32.9*  PLT 9* 24*   Blood smear 06/15/2023: The platelets are decreased in number, no platelet clumps.  The majority the white cells are mature neutrophils.  No blasts or other young forms are seen.  The red cells are macrocytic, increased polychromasia, ovalocytes and a few teardrops.  No nucleated red cells. BMET Recent Labs    06/19/23 0514 06/20/23 0458  NA 134* 137  K 3.9 4.2  CL 106 105  CO2 22 24  GLUCOSE 89 86  BUN 24* 21  CREATININE 0.85 0.89  CALCIUM 9.0 9.2    No results found for: "CEA1", "CEA", "NWG956", "CA125"  Studies/Results: No results found.  Medications: I have reviewed the patient's current medications.  Assessment/Plan:  Multiple myeloma- IgG lambda  bortezomib (subcutaneously), lenalidomide, and dexamethasone, with repeat bone marrow biopsy May of 2012 showing 10% plasmacytosis   (2) high-dose chemotherapy with BCNU and melphalan at [facility], followed by stem cell rescue July of 2012   (3) on zoledronic acid started December of 2012, initially monthly, currently given every 3 months, most recent dose  12/07/2015   (4) low-dose lenalidomide resumed April  2013, interrupted several times.  Resumed again on 02/19/2013, ata dose of 5 mg daily, 21 days on and 7 days off, later further reduced to 7 days on, 7 days off   (5) CNS symptoms and abnormal brain MRI extensively evaluated by neurology with no definitive diagnosis established   (6) rising M spike noted June 2014 but did not meet criteria for carfilzomib study   (7) on lenalidomide 25 mg daily, 14 days on, 7 days off, starting 04/18/2013, interrupted December 2014 because of rash;             (a) resumed January 2015 at 10 mg/ day at 21 days on/ 7 days off             (b) starting 08/18/2014 decreased to 10 mg/ day 14 days on, 7 days off because of cytopenias             (c) as of February 2016 was on 5 mg daily 7 days on 7 days off             (d) lenalidomide discontinued December 2016 with evidence of disease progression   (8) transient global amnesia 05/29/2015, resolved without intervention   (9) starting PVD 10/25/2015 w ASA 325 thromboprophylaxis, valacyclovir prophylaxis, last dose 12/17/2015             (a) pomalidomide 4 mg/d days 1-14             (b) bortezomib sQ days 2,5,9,12 of each 21 day cycle             (  c) dexamethasome 20 mg two days a week             (d) dexamethasone bortezomib and pomalidomide discontinued late December 2018 with poor tolerance   (10) metapneumovirus pneumonia April 2017             (a) completing course of steroids and week of bactrim mid April 2017   (11) status post second autologous transplant at [facility] 02/04/2016(preparatory regimen melphalan 200 mg/m)             (a) received twelve-month vaccinations 03/14/2017 (DPT, Haemophilus, Pneumovax 13, polio)             (b) 14 months injections 05/04/2016 include DTaP, Hib conjugate, HepB energex B 20 mcg/ml, Prevnar 13             (c) 24 month vaccines due at [facility] June 2019   (12) maintenance therapy started November 2017, consisting of             (a) bortezomib 1.3 mg/M2 every 14 days, first dose  07/27/2016             (b) pomalidomide 1 mg days 1-21 Q28 days, started 07/19/2016             (c) zolendronate monthly started 07/27/2016 (previously Q 3 months) however patient unable to tolerate, and changed back to q3 months in April, 2018             (d) Bortezomib changed to monthly as of June 2018 because of tolerance issues, however discontinued after 09/14/2017 dose because of a rise in his M spike             (e) pomalidomide held after 10/18/2017 in preparation for possible study at [facility] (venetoclax)--never resumed             (f) with numbers actually improved off treatment, resumed every 2-week bortezomib 12/25/2017             (g) changed to every 3-week bortezomib as of 03/17/2019             (h) briefly on weekly treatments times 22 October 2020 due to increase in M spike             (I) maintenance therapy discontinued with evidence of progression   (13) bortezomib/daratumumab/dexamethasone started 12/20/2020.             (a) Decadron dose dropped to 10 mg day of and day following treatment starting May 2022             (b) day 8 bortezomib omitted beginning with the June cycle per patient preference             (C) treatment changed to monthly daratumumab/Velcade/Decadron beginning 06/27/2021-patient decision (14) weekly Velcade/Cytoxan beginning 11/14/2021 (15) changed to Cytoxan/carfilzomib/Decadron 12/19/2021-treatment placed on hold 05/15/2022, carfilzomib 05/24/2022 and 05/31/2022 16.  Leukocyte a pheresis procedure at [facility] 06/15/2022 17.  Treatment resumed with Cytoxan/carfilzomib/Decadron 06/28/2022, last given 09/06/2022 18.  CAR-T 09/26/2022 with Carvykti 19.  CRS and ICANS following CAR-T therapy, status post a Decadron taper completed 10/08/2022 20.  Presentation to the emergency room 10/30/2022 with failure to thrive and a fall  21.  Pancytopenia secondary to multiple myeloma and CAR-T therapy-improved 22.  Bone marrow biopsy 11/13/2022-mildly hypocellular bone  marrow with relative erythroid hyperplasia and decreased megakaryocytes; no plasma cells identified by differential count or CD138 immunohistochemical stain; flow cytometry negative for a clonal plasma cell population.   bone  marrow biopsy 05/10/2023-mildly hypercellular marrow with mild decrease in megakaryocytes, plasma cells not increased, absent iron stores, 46 XY, negative myeloma FISH panel, negative myeloid panel 23.  Cognitive impairment predating CAR-T therapy and worsened following CAR-T therapy 24.  Admission 05/05/2023 with Pseudomonas sepsis CTs chest, abdomen, and pelvis-right lung base atelectasis, no infiltrate 25.  Admission 05/20/2023 with hypoxia, altered mental status, and fever Infectious disease evaluation on hospital admission consistent with a urinary tract infection, urine culture positive for Pseudomonas aeruginosa 26.  Hypogammaglobulinemia secondary to multiple myeloma and CAR-T therapy 27.  Admission 06/14/2023 with severe anemia, hypotension, and report of a positive blood culture from 06/13/2023?  With gram-positive cocci in clusters, recent fall with a plain x-ray 06/14/2023 confirming left sixth and seventh rib fractures    Andre [name] has a history of multiple myeloma.  He is in clinical remission from myeloma after undergoing CAR-T therapy earlier this year.  He has persistent pancytopenia following the CAR-T therapy.  A recent bone marrow biopsy did not reveal evidence of myeloma or another infiltrative bone marrow process.  He appears to have a bone marrow failure syndrome following CAR-T therapy.  He was admitted with severe symptomatic anemia following a recent fall.  There was an acute drop in the hemoglobin prior to hospital admission.  He received 3 units rbcs during this admission. No bleeding source identified.  No apparent hematoma.  The hemoglobin has stabilized following the Red cell transfusions.  Mr. Crace has a history of recurrent infections.  No  apparent source for infection at present.  Blood culture prior to hospital admission returned positive for gram-positive cocci-Staph epidermidis, likely a skin contaminant.  Repeat blood cultures are negative.  The outpatient blood culture may have been a contaminant.  He has completed a course of antibiotics.  The platelet count was lower yesterday, but has returned to baseline today.  He has no bleeding other than petechiae/ecchymoses.  The thrombocytopenia is likely related to bone marrow failure following CAR-T therapy.  There may be an immune component.  I discussed the case with Dr. Dorothea Ogle yesterday.  He agrees to the thrombocytopenia is likely related to bone marrow failure from the extensive course of treatment possibly treatment related MDS.  He could also have immune thrombocytopenia and atypical infection.  He recommends a trial of thrombopoietin therapy.  He also recommends obtaining peripheral blood PCR for parvovirus and CMV  Oral candidiasis appears improved with Diflucan and nystatin.  I discussed the case with his wife by telephone this morning  Recommendations: Complete 5 days of Diflucan for oral candidiasis Check peripheral blood CMV and parvovirus PCR Nplate today He appears stable for discharge from an oncology standpoint.  Outpatient follow-up will be scheduled at the Cancer center       LOS: 6 days   Thornton Papas, MD   06/20/2023, 7:45 AM

## 2023-06-20 NOTE — Progress Notes (Signed)
SATURATION QUALIFICATIONS: (This note is used to comply with regulatory documentation for home oxygen)  Patient Saturations on Room Air at Rest = 95%  Patient Saturations on Room Air while Ambulating = 88%  Patient Saturations on 2 Liters of oxygen while Ambulating = 92%

## 2023-06-20 NOTE — Progress Notes (Signed)
Resumed care from previous RN. Agree with RN assessment. Will continue plan of care.

## 2023-06-20 NOTE — Plan of Care (Signed)
  Problem: Education: Goal: Knowledge of General Education information will improve Description: Including pain rating scale, medication(s)/side effects and non-pharmacologic comfort measures Outcome: Progressing   Problem: Clinical Measurements: Goal: Ability to maintain clinical measurements within normal limits will improve Outcome: Progressing   Problem: Safety: Goal: Ability to remain free from injury will improve Outcome: Progressing   

## 2023-06-20 NOTE — Care Management Important Message (Signed)
Important Message  Patient Details IM Letter given. Name: Andre [name] MRN: [id number] Date of Birth: 05/24/1951   Important Message Given:  Yes - Medicare IM     [name] 06/20/2023, 1:17 PM

## 2023-06-20 NOTE — TOC Transition Note (Signed)
Transition of Care [facility]) - CM/SW Discharge Note   Patient Details  Name: Andre [name] MRN: [id number] Date of Birth: 05/07/1951  Transition of [facility]) CM/SW Contact:  Howell Rucks, RN Phone Number: 06/20/2023, 1:00 PM   Clinical Narrative:  DC order, pt to return to short term rehab at [facility]. RM 155, Nurse Call Report [phone]. [facility] for transport. No further TOC needs identified.       Barriers to Discharge: Barriers Resolved   Patient Goals and CMS Choice CMS Medicare.gov Compare Post Acute Care list provided to:: Patient Choice offered to / list presented to : Patient  Discharge Placement                Patient chooses bed at: [facility] Patient to be transferred to facility by: [facility] Name of family member notified: Zeller,[name] (Spouse)  [phone] [facility] Phone), left vm Patient and family notified of of transfer: 06/20/23  Discharge Plan and Services Additional resources added to the After Visit Summary for     Discharge Planning Services: CM Consult Post Acute Care Choice: Resumption of Svcs/PTA Provider                               Social Determinants of Health (SDOH) Interventions SDOH Screenings   Food Insecurity: No Food Insecurity (06/15/2023)  Housing: Low Risk  (06/15/2023)  Transportation Needs: No Transportation Needs (06/15/2023)  Utilities: Not At Risk (06/15/2023)  Depression (PHQ2-9): Low Risk  (06/08/2022)  Tobacco Use: Medium Risk (06/14/2023)     Readmission Risk Interventions    06/20/2023   12:59 PM  Readmission Risk Prevention Plan  Transportation Screening Complete  PCP or Specialist Appt within 3-5 Days Complete  HRI or Home Care Consult Complete  Social Work Consult for Recovery Care Planning/Counseling Complete  Palliative Care Screening Not Applicable  Medication Review Oceanographer) Complete

## 2023-06-20 NOTE — Progress Notes (Signed)
Call from [place] at [facility]. Patient returning there today. Called to f/u on his appointments. Made aware of appointments here on 10/9 and 10/18

## 2023-06-20 NOTE — Discharge Summary (Signed)
Physician Discharge Summary   Patient: Andre [name] MRN: [id number] DOB: 1951/07/03  Admit date:     06/14/2023  Discharge date: 06/20/23  Discharge Physician: Marguerita Merles, DO   PCP: [name], MD   Recommendations at discharge:   Follow up with PCP within 1-2 weeks and repeat CBC,CMP, Mag, Phos within 1 week Follow up with Medical Oncology Dr. Truett Perna within 1 week and follow up on peripheral blood CMV and Parovirus PCR   Discharge Diagnoses: Principal Problem:   Anemia associated with multiple myeloma treated with erythropoietin (HCC) Active Problems:   Hypothyroidism   Multiple myeloma not having achieved remission (HCC)   GERD (gastroesophageal reflux disease)   Pancytopenia (HCC)   Hypotension   Positive blood culture   Multiple rib fractures   Oral thrush   Candidiasis of mouth  Resolved Problems:   * No resolved hospital problems. [facility] Course: The patient is a 72 year old Caucasian male with past medical history significant for but not limited to multiple myeloma, chronic pancytopenia, recent hospitalization due to Pseudomonas UTI leading to sepsis who presented to the ED due to concern for positive blood cultures associated with generalized weakness and he was noted to have acute on chronic anemia and increased falls.  Oncology was consulted and was following and patient was transfused 2 units of PRBCs and CT of the abdomen pelvis was done and showed no active bleed and no intra-abdominal retroperitoneal bleed.  Patient received Nplate today and his chronic pancytopenia is stable.  His hypotension is improved and oncology feels he can be discharged today.  He will continue Diflucan and continues to transformative for his rib fractures.  He is medically stable for discharge with a follow-up visit.  Medical oncology at discharge.  Assessment and Plan:  Acute on chronic Anemia/Symptomatic Anemia, improved -Patient with history of multiple myeloma treated  with erythropoietin history of anemia presenting with acute on chronic anemia with no overt bleeding and noted on presentation to the ED to be hypotensive. -BP improved with IV fluids. -Patient status post transfusion 2 units PRBCs.   -Hemoglobin currently at 10.7 from 6.6 on admission.  -CT abdomen and pelvis done negative for intra-abdominal retroperitoneal bleed.  -Hematology/oncology following and follow up within 1-2 weeks and repeat CBC within 1 week   Chronic Pancytopenia -Patient with a persistent pancytopenia per hematology/oncology following CAR-T therapy.   -Per oncology patient with recent bone marrow biopsy with no evidence of myeloma or other infiltrative bone marrow process.  -CBC Trend: Recent Labs  Lab 06/14/23 1246 06/15/23 0510 06/15/23 1838 06/16/23 0536 06/17/23 0517 06/18/23 0434 06/19/23 0514 06/20/23 0458  WBC 3.5* 2.7*  --  3.7* 3.8* 3.8* 2.8* 2.8*  HGB 6.6* 6.7*   < > 9.7* 10.1* 10.4* 11.9* 10.7*  HCT 20.4* 21.2*   < > 29.0* 30.8* 31.9* 35.9* 32.9*  MCV 121.4* 122.5*  --  106.6* 109.2* 111.9* 110.1* 111.9*  PLT 29* 26*  --  23* 23* 23* 9* 24*   < > = values in this interval not displayed.  -Patient with no overt bleeding. -Patient being followed by hematology/oncology who are recommending repeat CBC in the morning of 10-20 24 and if platelet count remains less than 10K to transfuse platelets. -Oncology to contact Dr. Dorothea Ogle to discuss management of severe thrombocytopenia. -Hematology/oncology following and appreciate input and recommendations and giving the patient Nplate today and feel he is stable for D/C   Positive blood culture likely contaminant -It is noted that  only 1 culture was obtained at patient's facility prior to admission and could likely be a contaminant however unable to rule out bacteremia, patient immunocompromised with multiple myeloma and pancytopenia. -Repeat blood cultures with no growth to date x 5 days.   -Patient afebrile.    -Status post 5 days IV vancomycin IV cefepime.   -No further antibiotics needed at this time.   -Follow up with peripheral blood CMV and Parovirus PCR    Hypotension -Likely multifactorial secondary to hypovolemia and probable anemia with concern for possible sepsis due to 1 positive blood culture at facility. -Repeat blood cultures negative x 5 days. -Patient afebrile. -Patient with no pulmonary symptoms. -Urinalysis nitrite negative, leukocytes negative. -BP noted to have responded to IV fluids. -Patient status post transfusion 2 units PRBCs.   -Continue to Monitor BP per Protocol -Last BP reading was improved at 137/72   GERD/GI Prophylaxis -C/w PPI with po Pantoprazole 40 mg Daily    Hypothyroidism -C/w Levothyroxine 125 mcg po Daily .    Multiple Myeloma -Patient being followed by oncology during this hospitalization. -Outpatient follow-up with oncology.   Left sixth and seventh rib fractures -Secondary to Mechanical Fall -Continue Lidoderm patch, scheduled Tylenol.   -Incentive spirometry. -Repeat CXR today prior to D/C   Oral Thrush -Place on nystatin swish and swallow. -Continue oral Flucanazole 200 mg po Daily for 5 more Doses per Oncology Recc's.  Hypoalbuminemia -Patient's Albumin Trend: Recent Labs  Lab 05/22/23 0420 05/23/23 0413 06/06/23 0849 06/14/23 1246 06/16/23 0536 06/20/23 0458  ALBUMIN 3.0* 2.9* 3.9 3.0* 3.0* 3.2*  -Continue to Monitor and Trend and repeat CMP in the AM  Consultants: Medical Oncology Procedures performed: As delineated as above  Disposition: Skilled nursing facility Diet recommendation:  Cardiac diet DISCHARGE MEDICATION: Allergies as of 06/20/2023       Reactions   Atorvastatin Other (See Comments)   (LIPITOR) "Allergic," per MAR   Rosuvastatin Other (See Comments)   (CRESTOR) Liver Disorder (adverse effects) and "Allergic," per MAR   Septra [sulfamethoxazole-trimethoprim] Rash, Other (See Comments)   "Allergic,"  per [facility]        Medication List     STOP taking these medications    acetaminophen 650 MG CR tablet Commonly known as: TYLENOL Replaced by: acetaminophen 500 MG tablet   calcium-vitamin D 500-200 MG-UNIT tablet Commonly known as: OSCAL WITH D   ciprofloxacin 500 MG tablet Commonly known as: CIPRO       TAKE these medications    acetaminophen 500 MG tablet Commonly known as: TYLENOL Take 2 tablets (1,000 mg total) by mouth every 8 (eight) hours. Replaces: acetaminophen 650 MG CR tablet   Calcium 500 + D3 500-5 MG-MCG Tabs Generic drug: Calcium Carb-Cholecalciferol Take 1 tablet by mouth in the morning.   calcium carbonate 750 MG chewable tablet Commonly known as: TUMS EX Chew 2 tablets by mouth 3 (three) times daily as needed (for indigestion).   cyanocobalamin 1000 MCG tablet Commonly known as: VITAMIN B12 Take 1 tablet (1,000 mcg total) by mouth daily.   dapsone 100 MG tablet Take 100 mg by mouth daily.   docusate sodium 100 MG capsule Commonly known as: COLACE Take 100 mg by mouth daily as needed (for constipation).   escitalopram 10 MG tablet Commonly known as: LEXAPRO Take 1 tablet (10 mg total) by mouth daily.   feeding supplement (GLUCERNA SHAKE) Liqd Take 237 mLs by mouth 3 (three) times daily between meals.   ferrous sulfate 325 (65 FE) MG EC  tablet Take 325 mg by mouth 2 (two) times daily.   fluconazole 200 MG tablet Commonly known as: DIFLUCAN Take 1 tablet (200 mg total) by mouth daily for 4 days. Start taking on: June 21, 2023   folic acid 1 MG tablet Commonly known as: FOLVITE Take 1 tablet (1 mg total) by mouth daily.   levothyroxine 125 MCG tablet Commonly known as: SYNTHROID Take 125 mcg by mouth daily before breakfast.   lidocaine 4 % Place 1 patch onto the skin daily for 14 days. Apply to left side What changed:  when to take this additional instructions   methylphenidate 5 MG tablet Commonly known as: RITALIN Take 0.5  tablets (2.5 mg total) by mouth daily. Each morning What changed:  when to take this additional instructions   Muscle Rub 10-15 % Crea Apply 1 Application topically as needed for muscle pain.   nystatin 100000 UNIT/ML suspension Commonly known as: MYCOSTATIN Take 5 mLs (500,000 Units total) by mouth 4 (four) times daily.   ondansetron 4 MG tablet Commonly known as: ZOFRAN Take 1 tablet (4 mg total) by mouth every 6 (six) hours as needed for nausea.   OXYGEN Inhale into the lungs See admin instructions. Per MAR: "Taper oxygen to keep sats at GREATER THAN 90%"   pantoprazole 40 MG tablet Commonly known as: Protonix Take 1 tablet (40 mg total) by mouth daily. What changed: when to take this   QUEtiapine 25 MG tablet Commonly known as: SEROQUEL Take 1 tablet (25 mg total) by mouth at bedtime as needed (agitation).   valACYclovir 500 MG tablet Commonly known as: VALTREX Take 500 mg by mouth daily.   Vitamin D3 25 MCG (1000 UT) Caps Take 1,000 Units by mouth daily.               Durable Medical Equipment  (From admission, onward)           Start     Ordered   06/19/23 0849  For home use only DME oxygen  Once       Question Answer Comment  Length of Need 6 Months   Mode or (Route) Nasal cannula   Liters per Minute 2   Frequency Continuous (stationary and portable oxygen unit needed)   Oxygen conserving device Yes   Oxygen delivery system Gas      06/19/23 0849           Discharge Exam: There were no vitals filed for this visit. Vitals:   06/19/23 1940 06/20/23 0508  BP: 122/70 137/72  Pulse: 73 (!) 59  Resp: 17 16  Temp: 98.1 F (36.7 C) 97.9 F (36.6 C)  SpO2: 95% 95%   Examination: Physical Exam:  Constitutional: WN/WD overweight chronically ill-appearing Caucasian male Respiratory: Diminished to auscultation bilaterally with coarse breath sounds, no wheezing, rales, rhonchi or crackles. Normal respiratory effort and patient is not  tachypenic. No accessory muscle use. Wearing supplemental O2 via [place] Cardiovascular: RRR, no murmurs / rubs / gallops. S1 and S2 auscultated. No extremity edema.  Abdomen: Soft, non-tender, Distended 2/2 body habitus. Bowel sounds positive.  GU: Deferred. Musculoskeletal: No clubbing / cyanosis of digits/nails. No joint deformity upper and lower extremities.  Skin: No rashes, lesions, ulcers on a limited skin evaluation. No induration; Warm and dry.  Neurologic: CN 2-12 grossly intact with no focal deficits. Romberg sign and cerebellar reflexes not assessed.  Psychiatric: Normal judgment and insight. Alert and oriented x 3. Normal mood and appropriate affect.  Condition at discharge: stable  The results of significant diagnostics from this hospitalization (including imaging, microbiology, ancillary and laboratory) are listed below for reference.   Imaging Studies: CT ABDOMEN PELVIS W CONTRAST  Result Date: 06/16/2023 CLINICAL DATA:  Acute, non localized abdominal pain. Recent fall. Clinical concern for intra-abdominal or retroperitoneal hemorrhage. History of multiple myeloma. EXAM: CT ABDOMEN AND PELVIS WITH CONTRAST TECHNIQUE: Multidetector CT imaging of the abdomen and pelvis was performed using the standard protocol following bolus administration of intravenous contrast. RADIATION DOSE REDUCTION: This exam was performed according to the departmental dose-optimization program which includes automated exposure control, adjustment of the mA and/or kV according to patient size and/or use of iterative reconstruction technique. CONTRAST:  OMNIPAQUE IOHEXOL 300 MG/ML  SOLN COMPARISON:  05/05/2023 FINDINGS: Lower chest: Interval minimal left pleural effusion. A large hiatal hernia is again demonstrated in the left lower chest medially. Mild right lower lobe atelectasis with mild progression. Stable mild left lower lobe atelectasis. Hepatobiliary: 3 mm gallstone in the gallbladder. No gallbladder  thickening or pericholecystic fluid. Diffuse low density of the liver relative to the spleen. Pancreas: Unremarkable. No pancreatic ductal dilatation or surrounding inflammatory changes. Spleen: Normal in size without focal abnormality. Adrenals/Urinary Tract: Normal-appearing adrenal glands. Several small left renal simple cysts. These do not need imaging follow-up. Normal-appearing right kidney, ureters and urinary bladder. Stomach/Bowel: Large hiatal hernia. Mild sigmoid colon diverticulosis without evidence of diverticulitis. Prominent stool in the right colon and proximal hepatic flexure. Colon interposition on the right. Normal-appearing appendix. Unremarkable small bowel. Vascular/Lymphatic: Atheromatous arterial calcifications without aneurysm. No enlarged lymph nodes. Reproductive: Prostate is unremarkable. Other: Small to moderate-sized right and small left inguinal hernias. No intraperitoneal hemorrhage. Musculoskeletal: No retroperitoneal hemorrhage. Multiple yield, healing and acute left lower rib fractures. Mild-to-moderate right hip degenerative changes. Lumbar and lower thoracic spine degenerative changes. Stable old T12 compression deformity with no acute vertebral fractures or subluxations. IMPRESSION: 1. No intraperitoneal or retroperitoneal hemorrhage. 2. Multiple acute, subacute and healing left lower rib fractures. 3. Interval minimal left pleural effusion. 4. Large hiatal hernia. 5. Cholelithiasis. 6. Diffuse hepatic steatosis. 7. Mild sigmoid colon diverticulosis. 8. Small to moderate-sized right and small left inguinal hernias containing fat. Electronically Signed   By: Beckie Salts M.D.   On: 06/16/2023 15:48   DG Ribs Unilateral W/Chest Left  Result Date: 06/14/2023 CLINICAL DATA:  Left rib pain after fall. EXAM: LEFT RIBS AND CHEST - 3+ VIEW COMPARISON:  May 20, 2023. FINDINGS: Mildly displaced acute fractures are seen involving the left sixth and seventh ribs laterally. Subacute  to old left eighth rib fracture is noted. There is no evidence of pneumothorax or pleural effusion. Both lungs are clear. Heart size and mediastinal contours are within normal limits. IMPRESSION: Mildly displaced left sixth and seventh rib fractures. Electronically Signed   By: Lupita Raider M.D.   On: 06/14/2023 15:54   CT HEAD WO CONTRAST ( )  Result Date: 05/24/2023 CLINICAL DATA:  Initial evaluation evaluate initial evaluation for trauma, fall. EXAM: CT HEAD WITHOUT CONTRAST TECHNIQUE: Contiguous axial images were obtained from the base of the skull through the vertex without intravenous contrast. RADIATION DOSE REDUCTION: This exam was performed according to the departmental dose-optimization program which includes automated exposure control, adjustment of the mA and/or kV according to patient size and/or use of iterative reconstruction technique. COMPARISON:  Prior study from 05/21/2023. FINDINGS: Brain: Cerebral volume within normal limits. Moderately advanced chronic microvascular ischemic disease with small remote left cerebellar infarct. No  acute intracranial hemorrhage. No acute large vessel territory infarct. No mass lesion or midline shift. No hydrocephalus or extra-axial fluid collection. Vascular: No abnormal hyperdense vessel. Scattered calcified atherosclerosis present at the skull base. Skull: Scalp soft tissues demonstrate no acute finding. Calvarium intact. Innumerable lucency seen throughout the calvarium, consistent with history of multiple myeloma. Sinuses/Orbits: Globes and orbital soft tissues demonstrate no acute finding. Mild mucosal thickening about the ethmoidal air cells. Paranasal sinuses are otherwise clear. No mastoid effusion. Other: None. IMPRESSION: 1. No acute intracranial abnormality. 2. Moderately advanced chronic microvascular ischemic disease with small remote left cerebellar infarct. 3. Innumerable lucency throughout the calvarium, consistent with history of multiple  myeloma. Electronically Signed   By: Rise Mu M.D.   On: 05/24/2023 01:56   MR BRAIN WO CONTRAST  Result Date: 05/21/2023 CLINICAL DATA:  Delirium EXAM: MRI HEAD WITHOUT CONTRAST TECHNIQUE: Multiplanar, multiecho pulse sequences of the brain and surrounding structures were obtained without intravenous contrast. COMPARISON:  MRI June 2024. FINDINGS: Brain: No acute infarction, hemorrhage, hydrocephalus, extra-axial collection or mass lesion. Moderate for age T2/FLAIR hyperintensities the white matter, nonspecific but compatible with chronic microvascular ischemic disease. Cerebral atrophy. Remote cerebellar infarct. Vascular: Major arterial flow voids are maintained at the skull base. Skull and upper cervical spine: Heterogeneous bone marrow. Sinuses/Orbits: Clear sinuses.  No acute orbital findings. Other: No mastoid effusions. IMPRESSION: 1. No acute abnormality. 2. Chronic microvascular ischemic disease and remote cerebellar infarct. 3. Heterogeneous bone marrow, compatible with reported history multiple myeloma. Electronically Signed   By: Feliberto Harts M.D.   On: 05/21/2023 15:59    Microbiology: Results for orders placed or performed during the hospital encounter of 06/14/23  Blood Culture (routine x 2)     Status: None   Collection Time: 06/14/23 12:46 PM   Specimen: BLOOD  Result Value Ref Range Status   Specimen Description   Final    BLOOD BLOOD RIGHT WRIST Performed at [facility], [address] [place], [place] [address]    Special Requests   Final    BOTTLES DRAWN AEROBIC AND ANAEROBIC Blood Culture results may not be optimal due to an excessive volume of blood received in culture bottles Performed at [facility], [address] [place], [place] [address]    Culture   Final    NO GROWTH 5 DAYS Performed at [facility], [address] [place], [place] [address]    Report Status 06/19/2023 FINAL  Final  Blood Culture  (routine x 2)     Status: None   Collection Time: 06/14/23  1:03 PM   Specimen: BLOOD  Result Value Ref Range Status   Specimen Description   Final    BLOOD BLOOD LEFT HAND Performed at [facility], [address] [place], [place] [address]    Special Requests   Final    BOTTLES DRAWN AEROBIC AND ANAEROBIC Blood Culture adequate volume Performed at [facility], [address], [place], [place] [address]    Culture   Final    NO GROWTH 5 DAYS Performed at [facility], [address], [place], [place] [address]    Report Status 06/19/2023 FINAL  Final   *Note: Due to a large number of results and/or encounters for the requested time period, some results have not been displayed. A complete set of results can be found in Results Review.   Labs: CBC: Recent Labs  Lab 06/16/23 0536 06/17/23 5784  06/18/23 0434 06/19/23 0514 06/20/23 0458  WBC 3.7* 3.8* 3.8* 2.8* 2.8*  NEUTROABS 2.7 2.8 2.8 2.1 2.0  HGB 9.7* 10.1* 10.4* 11.9* 10.7*  HCT 29.0* 30.8* 31.9* 35.9* 32.9*  MCV 106.6* 109.2* 111.9* 110.1* 111.9*  PLT 23* 23* 23* 9* 24*   Basic Metabolic Panel: Recent Labs  Lab 06/16/23 0536 06/17/23 0517 06/18/23 0434 06/19/23 0514 06/20/23 0458  NA 137 136 136 134* 137  K 3.9 4.2 3.9 3.9 4.2  CL 104 103 106 106 105  CO2 25 25 21* 22 24  GLUCOSE 92 90 91 89 86  BUN 17 17 21  24* 21  CREATININE 0.92 0.84 0.66 0.85 0.89  CALCIUM 8.8* 9.0 9.2 9.0 9.2  MG 2.3  --   --   --  2.4  PHOS 3.7  --   --   --   --    Liver Function Tests: Recent Labs  Lab 06/14/23 1246 06/16/23 0536 06/20/23 0458  AST 27  --  29  ALT 24  --  21  ALKPHOS 69  --  83  BILITOT 0.6  --  0.4  PROT 5.6*  --  6.1*  ALBUMIN 3.0* 3.0* 3.2*   CBG: No results for input(s): "GLUCAP" in the last 168 hours.  Discharge time spent: greater than 30 minutes.  Signed: Marguerita Merles, DO [facility] 06/20/2023

## 2023-06-21 ENCOUNTER — Non-Acute Institutional Stay (SKILLED_NURSING_FACILITY): Payer: Medicare Other | Admitting: Adult Health

## 2023-06-21 ENCOUNTER — Encounter: Payer: Self-pay | Admitting: Adult Health

## 2023-06-21 DIAGNOSIS — D649 Anemia, unspecified: Secondary | ICD-10-CM

## 2023-06-21 DIAGNOSIS — R2681 Unsteadiness on feet: Secondary | ICD-10-CM | POA: Diagnosis not present

## 2023-06-21 DIAGNOSIS — D61818 Other pancytopenia: Secondary | ICD-10-CM

## 2023-06-21 DIAGNOSIS — C9000 Multiple myeloma not having achieved remission: Secondary | ICD-10-CM

## 2023-06-21 DIAGNOSIS — E039 Hypothyroidism, unspecified: Secondary | ICD-10-CM

## 2023-06-21 DIAGNOSIS — R195 Other fecal abnormalities: Secondary | ICD-10-CM

## 2023-06-21 DIAGNOSIS — S2242XD Multiple fractures of ribs, left side, subsequent encounter for fracture with routine healing: Secondary | ICD-10-CM

## 2023-06-21 DIAGNOSIS — R4189 Other symptoms and signs involving cognitive functions and awareness: Secondary | ICD-10-CM

## 2023-06-21 DIAGNOSIS — B370 Candidal stomatitis: Secondary | ICD-10-CM

## 2023-06-21 LAB — CMV DNA, QUANTITATIVE, PCR
CMV DNA Quant: NEGATIVE [IU]/mL
Log10 CMV Qn DNA Pl: UNDETERMINED {Log}

## 2023-06-21 MED ORDER — METHYLPHENIDATE HCL 5 MG PO TABS: 2.5000 mg | ORAL_TABLET | Freq: Every day | ORAL | Status: DC

## 2023-06-21 NOTE — Progress Notes (Signed)
Location:  Medical illustrator of Service:  SNF (31) Provider:  Peggye Ley, ANP [facility] [phone]  [name], MD  Patient Care Team: [name], MD as PCP - General (Internal Medicine) Shirlean Schlein, MD as Consulting Physician (Neurology) Eddie Candle, MD as Consulting Physician (Internal Medicine) York Spaniel, MD (Inactive) as Consulting Physician (Neurology) Sharlett Iles, DDS as Referring Physician (Oral Surgery) Ladene Artist, MD as Consulting Physician (Oncology) Fletcher Anon, NP as Nurse Practitioner (Nurse Practitioner)  Extended Emergency Contact Information Primary Emergency Contact: Minner,[name] Address: [address]          [place], [place] [address] [name] of [place] Home Phone: [phone] Relation: Spouse  Code Status:  DNR Goals of care: Advanced Directive information    06/14/2023    1:14 PM  Advanced Directives  Does Patient Have a Medical Advance Directive? Yes  Type of Advance Directive Out of facility DNR (pink MOST or yellow form)  Does patient want to make changes to medical advance directive? No - Patient declined     Chief Complaint  Patient presents with   hospital f/u    HPI:  Pt is a 72 y.o. male seen today for a hospital f/u s/p admission from 06/14/23-06/20/23.  PMH of multiple myeloma (s/p chemo and CAR T therapy, currently receiving IVIG, followed by oncology), chronic pancytopenia, hypothyroidism, and mild cognitive impairment secondary to CART and chronic microvascular changes.   Patient was sent to the ED 9/26 for evaluation of 1 positive blood culture growing gram positive cocci in clusters at [facility] as well weakness and hypoxia. Recent admissions 9/1-9/6 for UTI with pseudomonas and 8/17-8/23 for pseudomonas bacteremia. Repeat blood cultures at the hospital no growth. He received 5 day IV vancomycin and cefepime. At the hospital,  patient was also noted to have acute on chronic anemia with hemoglobin of 6.6 and hypotension. He received 2 units PRBCs during his admission. Left ribs and chest 3+ view revealed "mildly displaced left sixth and seventh rib fractures."  History of heme positive stool, seen by GI 8/14 who thought patient was high risk for colonoscopic evaluation and endoscopy, but willing to move forward if desired by family and deemed appropriate by oncologist/PCP.   Today patient is seen at [facility] and states he is feeling a lot better. He is sitting comfortably in his recliner. Rates his left rib pain 2/10. He is using the incentive spirometer frequently reaching 1300 mL.  States he is requiring minimal assistance transferring/ambulating, using his walker.  Reports mild constipation but he is eating well and staying hydrated. Denies signs of overt bleeding, symptomatic anemia or respiratory/urinary complications.    Past Medical History:  Diagnosis Date   Anemia    Arthritis    Asthma    no treatment x 20 years   Depression    Double vision    occurs at times    Duodenal ulcer    GERD (gastroesophageal reflux disease)    Hemorrhage of rectum and anus 09/15/2013   Hyperlipidemia    Hypertension    Hypothyroidism    Leukopenia due to antineoplastic chemotherapy (HCC) 02/22/2017   Multiple myeloma 07/04/2011   Thyroid disease    TIA (transient ischemic attack) 03/27/2012   Transient diplopia 03/27/2012   Transient global amnesia 06/07/2015   Past Surgical History:  Procedure Laterality Date   BONE MARROW TRANSPLANT  2011   for MM   CARDIOLITE STUDY  11/25/2003  NORMAL   TONSILLECTOMY      Allergies  Allergen Reactions   Atorvastatin Other (See Comments)    (LIPITOR) "Allergic," per MAR   Rosuvastatin Other (See Comments)    (CRESTOR) Liver Disorder (adverse effects) and "Allergic," per MAR   Septra [Sulfamethoxazole-Trimethoprim] Rash and Other (See Comments)    "Allergic," per [facility]     Outpatient Encounter Medications as of 06/21/2023  Medication Sig   acetaminophen (TYLENOL) 500 MG tablet Take 2 tablets (1,000 mg total) by mouth every 8 (eight) hours.   CALCIUM 500 + D3 500-5 MG-MCG TABS Take 1 tablet by mouth in the morning.   calcium carbonate (TUMS EX) 750 MG chewable tablet Chew 2 tablets by mouth 3 (three) times daily as needed (for indigestion).   Cholecalciferol (VITAMIN D3) 25 MCG (1000 UT) capsule Take 1,000 Units by mouth daily.   cyanocobalamin (VITAMIN B12) 1000 MCG tablet Take 1 tablet (1,000 mcg total) by mouth daily.   dapsone 100 MG tablet Take 100 mg by mouth daily.   docusate sodium (COLACE) 100 MG capsule Take 100 mg by mouth daily as needed (for constipation).   escitalopram (LEXAPRO) 10 MG tablet Take 1 tablet (10 mg total) by mouth daily.   feeding supplement, GLUCERNA SHAKE, (GLUCERNA SHAKE) LIQD Take 237 mLs by mouth 3 (three) times daily between meals.   ferrous sulfate 325 (65 FE) MG EC tablet Take 325 mg by mouth 2 (two) times daily.   fluconazole (DIFLUCAN) 200 MG tablet Take 1 tablet (200 mg total) by mouth daily for 4 days.   folic acid (FOLVITE) 1 MG tablet Take 1 tablet (1 mg total) by mouth daily.   levothyroxine (SYNTHROID) 125 MCG tablet Take 125 mcg by mouth daily before breakfast.   lidocaine 4 % Place 1 patch onto the skin daily for 14 days. Apply to left side (Patient taking differently: Place 1 patch onto the skin See admin instructions. Beginning on 06/13/2023, apply 1 new patch to the left side in the morning and remove at bedtime)   Menthol-Methyl Salicylate (MUSCLE RUB) 10-15 % CREA Apply 1 Application topically as needed for muscle pain.   methylphenidate (RITALIN) 5 MG tablet Take 0.5 tablets (2.5 mg total) by mouth daily. Each morning   nystatin (MYCOSTATIN) 100000 UNIT/ML suspension Take 5 mLs (500,000 Units total) by mouth 4 (four) times daily.   ondansetron (ZOFRAN) 4 MG tablet Take 1 tablet (4 mg total) by mouth every 6  (six) hours as needed for nausea.   OXYGEN Inhale into the lungs See admin instructions. Per MAR: "Taper oxygen to keep sats at GREATER THAN 90%"   pantoprazole (PROTONIX) 40 MG tablet Take 1 tablet (40 mg total) by mouth daily. (Patient taking differently: Take 40 mg by mouth daily before breakfast.)   QUEtiapine (SEROQUEL) 25 MG tablet Take 1 tablet (25 mg total) by mouth at bedtime as needed (agitation).   valACYclovir (VALTREX) 500 MG tablet Take 500 mg by mouth daily.   [DISCONTINUED] methylphenidate (RITALIN) 5 MG tablet Take 0.5 tablets (2.5 mg total) by mouth daily. Each morning (Patient taking differently: Take 2.5 mg by mouth in the morning.)   [DISCONTINUED] prochlorperazine (COMPAZINE) 10 MG tablet Take 1 tablet (10 mg total) by mouth every 6 (six) hours as needed (Nausea or vomiting).   No facility-administered encounter medications on file as of 06/21/2023.    Review of Systems  Constitutional:  Negative for appetite change, chills and fever.  Respiratory:  Negative for chest tightness and shortness  of breath.   Cardiovascular:  Negative for chest pain and palpitations.  Gastrointestinal:  Positive for constipation. Negative for abdominal pain, blood in stool, nausea and vomiting.  Genitourinary:  Negative for difficulty urinating, dysuria and hematuria.  Musculoskeletal:  Negative for back pain and myalgias.  Skin:  Negative for color change and pallor.  Neurological:  Negative for dizziness, weakness and light-headedness.  Psychiatric/Behavioral:  Negative for agitation and confusion.     Immunization History  Administered Date(s) Administered   DTaP / Hep B / IPV 05/04/2017   DTaP / HiB / IPV 03/06/2018   Fluad Quad(high Dose 65+) 06/09/2019, 06/25/2020, 07/25/2021, 05/30/2022   Fluad Trivalent(High Dose 65+) 08/04/2012, 07/03/2014, 06/16/2023   HIB (PRP-T) 05/04/2017   Hepatitis B, ADULT 03/14/2017, 03/06/2018   IPV 01/28/1998   Influenza, High Dose Seasonal PF  06/16/2018   Influenza,inj,Quad PF,6+ Mos 06/16/2013, 07/03/2014, 06/11/2015, 05/31/2017   Influenza-Unspecified 07/29/2012   PFIZER(Purple Top)SARS-COV-2 Vaccination 10/26/2019, 11/20/2019, 07/22/2020   Pfizer Covid-19 Vaccine Bivalent Booster 86yrs & up 06/27/2021   Pfizer(Comirnaty)Fall Seasonal Vaccine 12 years and older 07/05/2022   Pneumococcal Conjugate-13 10/15/2013, 05/04/2017, 03/06/2018   Pneumococcal Polysaccharide-23 06/16/2013   Respiratory Syncytial Virus Vaccine,Recomb Aduvanted(Arexvy) 05/30/2022   Td 02/10/1998, 07/03/2014   Pertinent  Health Maintenance Due  Topic Date Due   Colonoscopy  09/24/2023   INFLUENZA VACCINE  Completed      08/18/2022    9:00 AM 08/30/2022   11:01 AM 09/06/2022   10:00 AM 02/15/2023    1:29 PM 06/11/2023   11:20 AM  Fall Risk  Falls in the past year?    1 0  Was there an injury with Fall?    1 0  Fall Risk Category Calculator    3 0  (RETIRED) Patient Fall Risk Level Low fall risk Low fall risk Low fall risk    Patient at Risk for Falls Due to     History of fall(s)  Fall risk Follow up    Falls evaluation completed Falls evaluation completed   Functional Status Survey:    Vitals:   06/21/23 0600  BP: 134/81  Pulse: 67  Resp: 17  Temp: (!) 97 F (36.1 C)  SpO2: 91%  Weight: 79 kg   Body mass index is 27.28 kg/m. Physical Exam Constitutional:      Appearance: Normal appearance. He is not ill-appearing or toxic-appearing.  Cardiovascular:     Rate and Rhythm: Normal rate and regular rhythm.     Pulses: Normal pulses.     Heart sounds: Normal heart sounds.  Pulmonary:     Effort: Pulmonary effort is normal.     Breath sounds: Normal breath sounds.  Abdominal:     General: Bowel sounds are normal.     Palpations: Abdomen is soft.  Musculoskeletal:     Right lower leg: Edema present.     Left lower leg: Edema present.  Skin:    General: Skin is warm and dry.     Coloration: Skin is not pale.  Neurological:      General: No focal deficit present.     Mental Status: He is alert. Mental status is at baseline.  Psychiatric:        Mood and Affect: Mood normal.     Labs reviewed: Recent Labs    05/22/23 0420 05/23/23 0413 06/06/23 0849 06/16/23 0536 06/17/23 0517 06/18/23 0434 06/19/23 0514 06/20/23 0458  NA 137 134*   < > 137   < > 136 134* 137  K 4.0 3.7   < > 3.9   < > 3.9 3.9 4.2  CL 108 101   < > 104   < > 106 106 105  CO2 23 25   < > 25   < > 21* 22 24  GLUCOSE 93 97   < > 92   < > 91 89 86  BUN 15 21   < > 17   < > 21 24* 21  CREATININE 0.92 0.91   < > 0.92   < > 0.66 0.85 0.89  CALCIUM 9.2 8.9   < > 8.8*   < > 9.2 9.0 9.2  MG 2.3 2.1  --  2.3  --   --   --  2.4  PHOS 2.3* 2.4*  --  3.7  --   --   --   --    < > = values in this interval not displayed.   Recent Labs    06/06/23 0849 06/14/23 1246 06/16/23 0536 06/20/23 0458  AST 27 27  --  29  ALT 22 24  --  21  ALKPHOS 80 69  --  83  BILITOT 0.6 0.6  --  0.4  PROT 6.7 5.6*  --  6.1*  ALBUMIN 3.9 3.0* 3.0* 3.2*   Recent Labs    06/18/23 0434 06/19/23 0514 06/20/23 0458  WBC 3.8* 2.8* 2.8*  NEUTROABS 2.8 2.1 2.0  HGB 10.4* 11.9* 10.7*  HCT 31.9* 35.9* 32.9*  MCV 111.9* 110.1* 111.9*  PLT 23* 9* 24*   Lab Results  Component Value Date   TSH 0.173 (L) 05/20/2023   Lab Results  Component Value Date   HGBA1C 4.3 (L) 05/06/2023   Lab Results  Component Value Date   CHOL 178 02/13/2023   HDL 42 02/13/2023   LDLCALC 113 02/13/2023   LDLDIRECT 155.8 11/13/2012   TRIG 167 (A) 02/13/2023   CHOLHDL 4 09/06/2017    Significant Diagnostic Results in last 30 days:  DG Ribs Unilateral W/Chest Left  Result Date: 06/14/2023 CLINICAL DATA:  Left rib pain after fall. EXAM: LEFT RIBS AND CHEST - 3+ VIEW COMPARISON:  May 20, 2023. FINDINGS: Mildly displaced acute fractures are seen involving the left sixth and seventh ribs laterally. Subacute to old left eighth rib fracture is noted. There is no evidence of  pneumothorax or pleural effusion. Both lungs are clear. Heart size and mediastinal contours are within normal limits. IMPRESSION: Mildly displaced left sixth and seventh rib fractures. Electronically Signed   By: Lupita Raider M.D.   On: 06/14/2023 15:54    Assessment/Plan 1. Acute on chronic anemia -Repeat hemoglobin prior to discharge 10.7 -Vital signs stable, no hypotension at this time -Scheduled appointment with labs hematology/oncology on 10/9  2. Closed fracture of multiple ribs of left side with routine healing, subsequent encounter -Improving; rates pain 2/10 -Continue PRN tylenol for pain -Continue IS and mobilization to avoid respiratory complications -Fall precautions/education  3. Pancytopenia (HCC) -Scheduled appointment with labs hematology/oncology on 10/9  -platelets 24,000n prior to discharge; per hospital DC notes, hematology/oncology recommended platelet transfusion if platelet count less than 10,000  4. Gait instability -Improving -Continue PT -Fall precautions/education  5. Acquired hypothyroidism -Continue levothyroxine at current dose -Repeat TSH due in November, slightly low   6. Cognitive impairment -Stable; no acute changes  7. Oral thrush -Continue nystatin and fluconazole  8. Heme positive stool --Continue protonix -History of hemoccult positive X3, no overt GI bleed; may need endoscopy/colonoscopy if  recurrence, refer to Dr. Chales Abrahams  9. Multiple myeloma, remission status unspecified (HCC) -In remission -Continue care with oncology   I have examined the above patient and agreed with the above assessment and plan of care.   Peggye Ley, ANP [facility] [phone]

## 2023-06-21 NOTE — Consult Note (Signed)
[facility]  [facility] Inpatient Consult   06/21/2023  Andre [name] 12-05-1950 [id number]  [facility] [[facility]]  Accountable Care Organization [ACO] Patient: Medicare ACO REACH  Primary Care Provider: [name], MD,  With [facility], this provider is listed for the transition of care follow up appointments   Follow up:  High risk readmission   [facility] Liaison screened the patient remotely at [facility].    The patient was screened for 30 day readmission hospitalization with noted high risk score for unplanned readmission risk 3 hospital admissions and 2 ED visits in 6 months.  The patient was assessed for potential [facility]) Care Management service needs for post hospital transition for care coordination. Review of patient's electronic medical record reveals patient is followed closely with the oncology team admitted with anemia and multiple myeloma.   Plan: No additional follow up needs for community care coordination on behalf of [facility]. Needs to be met at [facility] at [facility] facility.   Community Care Management/Population Health does not replace or interfere with any arrangements made by the Inpatient Transition of Care team.   For questions contact:   Charlesetta Shanks, RN, BSN, CCM [place]  [facility], [facility] Liaison Direct Dial: [phone] or secure chat Website: [email or web address]         .

## 2023-06-22 ENCOUNTER — Other Ambulatory Visit: Payer: Self-pay | Admitting: Nurse Practitioner

## 2023-06-25 ENCOUNTER — Encounter: Payer: Self-pay | Admitting: Internal Medicine

## 2023-06-25 ENCOUNTER — Non-Acute Institutional Stay (SKILLED_NURSING_FACILITY): Payer: Medicare Other | Admitting: Internal Medicine

## 2023-06-25 DIAGNOSIS — B370 Candidal stomatitis: Secondary | ICD-10-CM

## 2023-06-25 DIAGNOSIS — S2242XD Multiple fractures of ribs, left side, subsequent encounter for fracture with routine healing: Secondary | ICD-10-CM | POA: Diagnosis not present

## 2023-06-25 DIAGNOSIS — R2681 Unsteadiness on feet: Secondary | ICD-10-CM

## 2023-06-25 DIAGNOSIS — E039 Hypothyroidism, unspecified: Secondary | ICD-10-CM

## 2023-06-25 DIAGNOSIS — D649 Anemia, unspecified: Secondary | ICD-10-CM

## 2023-06-25 DIAGNOSIS — D61818 Other pancytopenia: Secondary | ICD-10-CM | POA: Diagnosis not present

## 2023-06-25 DIAGNOSIS — R4189 Other symptoms and signs involving cognitive functions and awareness: Secondary | ICD-10-CM

## 2023-06-25 DIAGNOSIS — C9000 Multiple myeloma not having achieved remission: Secondary | ICD-10-CM

## 2023-06-25 LAB — HUMAN PARVOVIRUS DNA DETECTION BY PCR: Parvovirus B19, PCR: NEGATIVE

## 2023-06-25 NOTE — Progress Notes (Unsigned)
Provider:  Merian Capron  Location:   [facility]  Nursing Home Room Number: 153-A Place of Service:  SNF (31)  PCP: [name], MD Patient Care Team: [name], MD as PCP - General (Internal Medicine) Shirlean Schlein, MD as Consulting Physician (Neurology) Eddie Candle, MD as Consulting Physician (Internal Medicine) York Spaniel, MD (Inactive) as Consulting Physician (Neurology) Sharlett Iles, DDS as Referring Physician (Oral Surgery) Ladene Artist, MD as Consulting Physician (Oncology) Fletcher Anon, NP as Nurse Practitioner (Nurse Practitioner)  Extended Emergency Contact Information Primary Emergency Contact: Mullins,[name] Address: [address]          [place], [place] [address] [name] of [place] Home Phone: [phone] Relation: Spouse  Code Status:  DNR Goals of Care: Advanced Directive information    06/14/2023    1:14 PM  Advanced Directives  Does Patient Have a Medical Advance Directive? Yes  Type of Advance Directive Out of facility DNR (pink MOST or yellow form)  Does patient want to make changes to medical advance directive? No - Patient declined      Chief Complaint  Patient presents with  . Readmit To SNF    HPI: Patient is a 72 y.o. male seen today for readmission to Rehab  Lives iN SNF in [place] now  Admitted to the hospital from 09/26-10/02 for Pancytopenia severe anemia and Weakness  Patient has h/o Multiple Myeloma diagnosed in 2012 Post CART therapy per Dr Marissa Calamity on 01/24  In Clinical Remission On IVIG monthly per Oncology [facility] due to CART and Chronic Microvascular changes On Ritalin Recurent falls due to Unstable Gait Anemia with Occult positive stools Pancytopenia due to above  He has had number of hospital admission s recently Past Medical History:  Diagnosis Date  . Anemia   . Arthritis   . Asthma    no treatment x 20 years  . Depression   . Double vision     occurs at times   . Duodenal ulcer   . GERD (gastroesophageal reflux disease)   . Hemorrhage of rectum and anus 09/15/2013  . Hyperlipidemia   . Hypertension   . Hypothyroidism   . Leukopenia due to antineoplastic chemotherapy (HCC) 02/22/2017  . Multiple myeloma 07/04/2011  . Thyroid disease   . TIA (transient ischemic attack) 03/27/2012  . Transient diplopia 03/27/2012  . Transient global amnesia 06/07/2015   Past Surgical History:  Procedure Laterality Date  . BONE MARROW TRANSPLANT  2011   for MM  . CARDIOLITE STUDY  11/25/2003   NORMAL  . TONSILLECTOMY      reports that he quit smoking about 54 years ago. His smoking use included cigarettes. He started smoking about 57 years ago. He has never used smokeless tobacco. He reports that he does not drink alcohol and does not use drugs. Social History   Socioeconomic History  . Marital status: Married    Spouse name: Not on file  . Number of children: 2  . Years of education: 63  . Highest education level: Not on file  Occupational History  . Not on file  Tobacco Use  . Smoking status: Former    Current packs/day: 0.00    Types: Cigarettes    Start date: 03/27/1966    Quit date: 03/27/1969    Years since quitting: 54.2  . Smokeless tobacco: Never  Vaping Use  . Vaping status: Never Used  Substance and Sexual Activity  . Alcohol use: No  . Drug  use: No  . Sexual activity: Not Currently  Other Topics Concern  . Not on file  Social History Narrative   Right handed   Un[employer] fan   Drinks caffeine   [facility]   [facility] 8 years    Social Determinants of Health   Financial Resource Strain: Not on file  Food Insecurity: No Food Insecurity (06/15/2023)   Hunger Vital Sign   . Worried About Programme researcher, broadcasting/film/video in the Last Year: Never true   . Ran Out of Food in the Last Year: Never true  Transportation Needs: No Transportation Needs (06/15/2023)   PRAPARE - Transportation   . Lack of Transportation (Medical): No   .  Lack of Transportation (Non-Medical): No  Physical Activity: Not on file  Stress: Not on file  Social Connections: Not on file  Intimate Partner Violence: Not At Risk (06/15/2023)   Humiliation, Afraid, Rape, and Kick questionnaire   . Fear of Current or Ex-Partner: No   . Emotionally Abused: No   . Physically Abused: No   . Sexually Abused: No    Functional Status Survey:    Family History  Problem Relation Age of Onset  . Throat cancer Mother   . Hypertension Father   . Stroke Father   . Asthma Father   . Diabetes Father   . Pancreatic cancer Brother     Health Maintenance  Topic Date Due  . Medicare Annual Wellness (AWV)  Never done  . Pneumonia Vaccine 44+ Years old (3 of 3 - PPSV23 or PCV20) 06/16/2018  . COVID-19 Vaccine (6 - 2023-24 season) 05/20/2023  . Colonoscopy  09/24/2023  . Zoster Vaccines- Shingrix (1 of 2) 02/18/2024 (Originally 06/06/1970)  . DTaP/Tdap/Td (5 - Tdap) 03/06/2028  . INFLUENZA VACCINE  Completed  . HPV VACCINES  Aged Out  . Hepatitis C Screening  Discontinued    Allergies  Allergen Reactions  . Atorvastatin Other (See Comments)    (LIPITOR) "Allergic," per MAR  . Rosuvastatin Other (See Comments)    (CRESTOR) Liver Disorder (adverse effects) and "Allergic," per MAR  . Septra [Sulfamethoxazole-Trimethoprim] Rash and Other (See Comments)    "Allergic," per MAR    Allergies as of 06/25/2023       Reactions   Atorvastatin Other (See Comments)   (LIPITOR) "Allergic," per MAR   Rosuvastatin Other (See Comments)   (CRESTOR) Liver Disorder (adverse effects) and "Allergic," per [facility]   Septra [sulfamethoxazole-trimethoprim] Rash, Other (See Comments)   "Allergic," per [facility]        Medication List        Accurate as of June 25, 2023 10:25 AM. If you have any questions, ask your nurse or doctor.          STOP taking these medications    feeding supplement (GLUCERNA SHAKE) Liqd Stopped by: Pamula Luther L Rushawn Capshaw   fluconazole 200 MG  tablet Commonly known as: DIFLUCAN Stopped by: Mahamadou Weltz L Harley Fitzwater   OXYGEN Stopped by: [name]       TAKE these medications    acetaminophen 500 MG tablet Commonly known as: TYLENOL Take 2 tablets (1,000 mg total) by mouth every 8 (eight) hours.   Calcium 500 + D3 500-5 MG-MCG Tabs Generic drug: Calcium Carb-Cholecalciferol Take 1 tablet by mouth in the morning.   calcium carbonate 750 MG chewable tablet Commonly known as: TUMS EX Chew 2 tablets by mouth 3 (three) times daily as needed (for indigestion).   cyanocobalamin 1000 MCG tablet Commonly known  as: VITAMIN B12 Take 1 tablet (1,000 mcg total) by mouth daily.   dapsone 100 MG tablet Take 100 mg by mouth daily.   docusate sodium 100 MG capsule Commonly known as: COLACE Take 100 mg by mouth daily as needed (for constipation).   escitalopram 10 MG tablet Commonly known as: LEXAPRO Take 1 tablet (10 mg total) by mouth daily.   ferrous sulfate 325 (65 FE) MG EC tablet Take 325 mg by mouth 2 (two) times daily.   folic acid 1 MG tablet Commonly known as: FOLVITE Take 1 tablet (1 mg total) by mouth daily.   levothyroxine 125 MCG tablet Commonly known as: SYNTHROID Take 125 mcg by mouth daily before breakfast.   lidocaine 4 % cream Commonly known as: LMX Apply 1 Application topically in the morning and at bedtime. What changed: Another medication with the same name was removed. Continue taking this medication, and follow the directions you see here. Changed by: [name]   Muscle Rub 10-15 % Crea Apply 1 Application topically as needed for muscle pain.   nystatin 100000 UNIT/ML suspension Commonly known as: MYCOSTATIN Take 5 mLs (500,000 Units total) by mouth 4 (four) times daily.   ondansetron 4 MG tablet Commonly known as: ZOFRAN Take 1 tablet (4 mg total) by mouth every 6 (six) hours as needed for nausea.   pantoprazole 40 MG tablet Commonly known as: Protonix Take 1 tablet (40 mg total) by  mouth daily.   QUEtiapine 25 MG tablet Commonly known as: SEROQUEL Take 1 tablet (25 mg total) by mouth at bedtime as needed (agitation).   RITALIN PO Take 2.5 mg by mouth every morning. What changed: Another medication with the same name was removed. Continue taking this medication, and follow the directions you see here. Changed by: [name]   valACYclovir 500 MG tablet Commonly known as: VALTREX Take 500 mg by mouth daily.   Vitamin D3 25 MCG (1000 UT) Caps Take 1,000 Units by mouth daily.        Review of Systems  Constitutional:  Negative for activity change, appetite change and unexpected weight change.  HENT: Negative.    Respiratory:  Negative for cough and shortness of breath.   Cardiovascular:  Negative for leg swelling.  Gastrointestinal:  Negative for constipation.  Genitourinary:  Negative for frequency.  Musculoskeletal:  Positive for gait problem. Negative for arthralgias and myalgias.  Skin: Negative.  Negative for rash.  Neurological:  Negative for dizziness and weakness.  Psychiatric/Behavioral:  Positive for confusion. Negative for sleep disturbance.   All other systems reviewed and are negative.   Vitals:   06/25/23 1017  BP: 131/74  Pulse: 64  Resp: 12  Temp: 98.6 F (37 C)  SpO2: 93%  Weight: 173 lb 9.6 oz (78.7 kg)  Height: 5\' 7"  (1.702 m)   Body mass index is 27.19 kg/m. Physical Exam Vitals reviewed.  Constitutional:      Appearance: Normal appearance.  HENT:     Head: Normocephalic.     Nose: Nose normal.     Mouth/Throat:     Mouth: Mucous membranes are moist.     Pharynx: Oropharynx is clear.  Eyes:     Pupils: Pupils are equal, round, and reactive to light.  Cardiovascular:     Rate and Rhythm: Normal rate and regular rhythm.     Pulses: Normal pulses.     Heart sounds: No murmur heard. Pulmonary:     Effort: Pulmonary effort is normal. No  respiratory distress.     Breath sounds: Normal breath sounds. No rales.   Abdominal:     General: Abdomen is flat. Bowel sounds are normal.     Palpations: Abdomen is soft.  Musculoskeletal:        General: Swelling present.     Cervical back: Neck supple.  Skin:    General: Skin is warm.  Neurological:     General: No focal deficit present.     Mental Status: He is alert.  Psychiatric:        Mood and Affect: Mood normal.        Thought Content: Thought content normal.    Labs reviewed: Basic Metabolic Panel: Recent Labs    05/22/23 0420 05/23/23 0413 06/06/23 0849 06/16/23 0536 06/17/23 0517 06/18/23 0434 06/19/23 0514 06/20/23 0458  NA 137 134*   < > 137   < > 136 134* 137  K 4.0 3.7   < > 3.9   < > 3.9 3.9 4.2  CL 108 101   < > 104   < > 106 106 105  CO2 23 25   < > 25   < > 21* 22 24  GLUCOSE 93 97   < > 92   < > 91 89 86  BUN 15 21   < > 17   < > 21 24* 21  CREATININE 0.92 0.91   < > 0.92   < > 0.66 0.85 0.89  CALCIUM 9.2 8.9   < > 8.8*   < > 9.2 9.0 9.2  MG 2.3 2.1  --  2.3  --   --   --  2.4  PHOS 2.3* 2.4*  --  3.7  --   --   --   --    < > = values in this interval not displayed.   Liver Function Tests: Recent Labs    06/06/23 0849 06/14/23 1246 06/16/23 0536 06/20/23 0458  AST 27 27  --  29  ALT 22 24  --  21  ALKPHOS 80 69  --  83  BILITOT 0.6 0.6  --  0.4  PROT 6.7 5.6*  --  6.1*  ALBUMIN 3.9 3.0* 3.0* 3.2*   Recent Labs    10/29/22 2042 05/05/23 1901 05/20/23 1643  LIPASE 57* 30 26   Recent Labs    05/20/23 1825  AMMONIA 26   CBC: Recent Labs    06/18/23 0434 06/19/23 0514 06/20/23 0458  WBC 3.8* 2.8* 2.8*  NEUTROABS 2.8 2.1 2.0  HGB 10.4* 11.9* 10.7*  HCT 31.9* 35.9* 32.9*  MCV 111.9* 110.1* 111.9*  PLT 23* 9* 24*   Cardiac Enzymes: Recent Labs    05/22/23 0420  CKTOTAL 28*   BNP: Invalid input(s): "POCBNP" Lab Results  Component Value Date   HGBA1C 4.3 (L) 05/06/2023   Lab Results  Component Value Date   TSH 0.173 (L) 05/20/2023   Lab Results  Component Value Date    VITAMINB12 1,163 (H) 05/20/2023   Lab Results  Component Value Date   FOLATE 27.7 05/20/2023   Lab Results  Component Value Date   IRON 199 (H) 06/06/2023   TIBC 461 (H) 06/06/2023   FERRITIN 175 06/06/2023    Imaging and Procedures obtained prior to SNF admission: DG Ribs Unilateral W/Chest Left  Result Date: 06/14/2023 CLINICAL DATA:  Left rib pain after fall. EXAM: LEFT RIBS AND CHEST - 3+ VIEW COMPARISON:  May 20, 2023. FINDINGS: Mildly displaced acute fractures  are seen involving the left sixth and seventh ribs laterally. Subacute to old left eighth rib fracture is noted. There is no evidence of pneumothorax or pleural effusion. Both lungs are clear. Heart size and mediastinal contours are within normal limits. IMPRESSION: Mildly displaced left sixth and seventh rib fractures. Electronically Signed   By: Lupita Raider M.D.   On: 06/14/2023 15:54    Assessment/Plan 1. Acute on chronic anemia HGB was 6.6 initially with weakness He got 2 units CT abdimen was negative Follow up with Oncology with repeat CBC  2. Pancytopenia (HCC) Bone mArrow Failure after CART therapy  3. Closed fracture of multiple ribs of left side with routine healing, subsequent encounter Due to Recurent Falls PRN tylenol for now  4. Acquired hypothyroidism TSH was low in the [place]; Has follow up TSH pending 5. Gait instability Works with therapy Number of falls due to hsi cognition and Neuropathy  6. Oral thrush Resolved Nystatin Discontinued  7. Cognitive impairment Plan for him to now stay in SNF Due to Vascular Disease and CART therapy 8. Multiple myeloma, remission status unspecified (HCC) In Remission Per Oncolology BM done recently was negative for Recurrence On IVIG Per Oncology On Dapsone and Valtrex for Prophylaxis  9 Depression On lexapro   Family/ staff Communication:   Labs/tests ordered:

## 2023-06-26 ENCOUNTER — Encounter: Payer: Self-pay | Admitting: Orthopedic Surgery

## 2023-06-26 ENCOUNTER — Non-Acute Institutional Stay (SKILLED_NURSING_FACILITY): Payer: Medicare Other | Admitting: Orthopedic Surgery

## 2023-06-26 ENCOUNTER — Other Ambulatory Visit: Payer: Self-pay | Admitting: Orthopedic Surgery

## 2023-06-26 ENCOUNTER — Encounter: Payer: Self-pay | Admitting: Internal Medicine

## 2023-06-26 DIAGNOSIS — R296 Repeated falls: Secondary | ICD-10-CM | POA: Diagnosis not present

## 2023-06-26 DIAGNOSIS — M25551 Pain in right hip: Secondary | ICD-10-CM | POA: Diagnosis not present

## 2023-06-26 DIAGNOSIS — S2242XD Multiple fractures of ribs, left side, subsequent encounter for fracture with routine healing: Secondary | ICD-10-CM | POA: Diagnosis not present

## 2023-06-26 DIAGNOSIS — F909 Attention-deficit hyperactivity disorder, unspecified type: Secondary | ICD-10-CM

## 2023-06-26 MED ORDER — METHYLPHENIDATE HCL 5 MG PO TABS
2.5000 mg | ORAL_TABLET | Freq: Every morning | ORAL | 0 refills | Status: DC
Start: 2023-06-26 — End: 2023-07-26

## 2023-06-26 NOTE — Progress Notes (Unsigned)
Location:  [facility] Room Number: 153/A Place of Service:    Provider:  Octavia Heir, NP   [name], MD  Patient Care Team: [name], MD as PCP - General (Internal Medicine) Shirlean Schlein, MD as Consulting Physician (Neurology) Eddie Candle, MD as Consulting Physician (Internal Medicine) York Spaniel, MD (Inactive) as Consulting Physician (Neurology) Sharlett Iles, DDS as Referring Physician (Oral Surgery) Ladene Artist, MD as Consulting Physician (Oncology) Fletcher Anon, NP as Nurse Practitioner (Nurse Practitioner)  Extended Emergency Contact Information Primary Emergency Contact: Thomason,[name] Address: [address]          [place], [place] [address] [name] of [place] Home Phone: [phone] Relation: Spouse  Code Status:  DNR Goals of care: Advanced Directive information    06/25/2023   10:26 AM  Advanced Directives  Does Patient Have a Medical Advance Directive? Yes  Type of Advance Directive Out of facility DNR (pink MOST or yellow form)  Does patient want to make changes to medical advance directive? No - Patient declined     Chief Complaint  Patient presents with   Acute Visit    Right hip pain post fall    HPI:  Pt is a 72 y.o. male seen today for acute visit due to right hip pain.   He currently resides on the rehab unit at [facility]. PMH: TIA, asthma, hypoxia OSA, GERD, peptic ulcer disease, hypothyroidism, chemotherapy related neuropathy, multiple myeloma, memory impairment, pancytopenia, weakness and constipation.   CNA was assisting him to toilet when he abruptly sat down. His right side hit pipe behind toilet when he sat down. He complained of increased right hip/pelvic pain after event. He was given tylenol 1000 mg and pain decreased some. He is able to bear weight without difficulty.   Diagnosed with left sided 6th and 7th rib fractures post fall earlier this  month. Pain stable with scheduled tylenol.        Past Medical History:  Diagnosis Date   Anemia    Arthritis    Asthma    no treatment x 20 years   Depression    Double vision    occurs at times    Duodenal ulcer    GERD (gastroesophageal reflux disease)    Hemorrhage of rectum and anus 09/15/2013   Hyperlipidemia    Hypertension    Hypothyroidism    Leukopenia due to antineoplastic chemotherapy (HCC) 02/22/2017   Multiple myeloma 07/04/2011   Thyroid disease    TIA (transient ischemic attack) 03/27/2012   Transient diplopia 03/27/2012   Transient global amnesia 06/07/2015   Past Surgical History:  Procedure Laterality Date   BONE MARROW TRANSPLANT  2011   for MM   CARDIOLITE STUDY  11/25/2003   NORMAL   TONSILLECTOMY      Allergies  Allergen Reactions   Atorvastatin Other (See Comments)    (LIPITOR) "Allergic," per [facility]   Rosuvastatin Other (See Comments)    (CRESTOR) Liver Disorder (adverse effects) and "Allergic," per [facility]   Septra [Sulfamethoxazole-Trimethoprim] Rash and Other (See Comments)    "Allergic," per [facility]    Outpatient Encounter Medications as of 06/26/2023  Medication Sig   acetaminophen (TYLENOL) 500 MG tablet Take 2 tablets (1,000 mg total) by mouth every 8 (eight) hours.   CALCIUM 500 + D3 500-5 MG-MCG TABS Take 1 tablet by mouth in the morning.   calcium carbonate (TUMS EX) 750 MG chewable tablet Chew 2 tablets by  mouth 3 (three) times daily as needed (for indigestion).   Cholecalciferol (VITAMIN D3) 25 MCG (1000 UT) capsule Take 1,000 Units by mouth daily.   cyanocobalamin (VITAMIN B12) 1000 MCG tablet Take 1 tablet (1,000 mcg total) by mouth daily.   dapsone 100 MG tablet Take 100 mg by mouth daily.   docusate sodium (COLACE) 100 MG capsule Take 100 mg by mouth daily as needed (for constipation).   escitalopram (LEXAPRO) 10 MG tablet Take 1 tablet (10 mg total) by mouth daily.   ferrous sulfate 325 (65 FE) MG EC tablet Take 325 mg by mouth 2  (two) times daily.   folic acid (FOLVITE) 1 MG tablet Take 1 tablet (1 mg total) by mouth daily.   levothyroxine (SYNTHROID) 125 MCG tablet Take 125 mcg by mouth daily before breakfast.   lidocaine (LMX) 4 % cream Apply 1 Application topically in the morning and at bedtime.   Menthol-Methyl Salicylate (MUSCLE RUB) 10-15 % CREA Apply 1 Application topically as needed for muscle pain.   methylphenidate (RITALIN) 5 MG tablet Take 0.5 tablets (2.5 mg total) by mouth every morning.   nystatin (MYCOSTATIN) 100000 UNIT/ML suspension Take 5 mLs (500,000 Units total) by mouth 4 (four) times daily.   ondansetron (ZOFRAN) 4 MG tablet Take 1 tablet (4 mg total) by mouth every 6 (six) hours as needed for nausea.   pantoprazole (PROTONIX) 40 MG tablet Take 1 tablet (40 mg total) by mouth daily.   valACYclovir (VALTREX) 500 MG tablet Take 500 mg by mouth daily.   [DISCONTINUED] prochlorperazine (COMPAZINE) 10 MG tablet Take 1 tablet (10 mg total) by mouth every 6 (six) hours as needed (Nausea or vomiting).   No facility-administered encounter medications on file as of 06/26/2023.    Review of Systems  Constitutional:  Negative for activity change and appetite change.  Respiratory:  Negative for cough, shortness of breath and wheezing.   Cardiovascular:  Negative for chest pain and leg swelling.  Musculoskeletal:  Positive for gait problem.       Frequent falls   Skin:  Negative for wound.  Neurological:  Positive for weakness. Negative for dizziness and light-headedness.  Psychiatric/Behavioral:  Negative for confusion and dysphoric mood. The patient is not nervous/anxious.     Immunization History  Administered Date(s) Administered   DTaP / Hep B / IPV 05/04/2017   DTaP / HiB / IPV 03/06/2018   Fluad Quad(high Dose 65+) 06/09/2019, 06/25/2020, 07/25/2021, 05/30/2022   Fluad Trivalent(High Dose 65+) 08/04/2012, 07/03/2014, 06/16/2023   HIB (PRP-T) 05/04/2017   Hepatitis B, ADULT 03/14/2017,  03/06/2018   IPV 01/28/1998   Influenza, High Dose Seasonal PF 06/16/2018   Influenza,inj,Quad PF,6+ Mos 06/16/2013, 07/03/2014, 06/11/2015, 05/31/2017   Influenza-Unspecified 07/29/2012   PFIZER(Purple Top)SARS-COV-2 Vaccination 10/26/2019, 11/20/2019, 07/22/2020   Pfizer Covid-19 Vaccine Bivalent Booster 44yrs & up 06/27/2021   Pfizer(Comirnaty)Fall Seasonal Vaccine 12 years and older 07/05/2022   Pneumococcal Conjugate-13 10/15/2013, 05/04/2017, 03/06/2018   Pneumococcal Polysaccharide-23 06/16/2013   Respiratory Syncytial Virus Vaccine,Recomb Aduvanted(Arexvy) 05/30/2022   Td 02/10/1998, 07/03/2014   Pertinent  Health Maintenance Due  Topic Date Due   Colonoscopy  09/24/2023   INFLUENZA VACCINE  Completed      08/18/2022    9:00 AM 08/30/2022   11:01 AM 09/06/2022   10:00 AM 02/15/2023    1:29 PM 06/11/2023   11:20 AM  Fall Risk  Falls in the past year?    1 0  Was there an injury with Fall?  1 0  Fall Risk Category Calculator    3 0  (RETIRED) Patient Fall Risk Level Low fall risk Low fall risk Low fall risk    Patient at Risk for Falls Due to     History of fall(s)  Fall risk Follow up    Falls evaluation completed Falls evaluation completed   Functional Status Survey:    Vitals:   06/26/23 1617  BP: (!) 158/88  Pulse: 76  Resp: 16  Temp: 98.6 F (37 C)  SpO2: 92%  Weight: 170 lb 12.8 oz (77.5 kg)  Height: 5\' 7"  (1.702 m)   Body mass index is 26.75 kg/m. Physical Exam Vitals reviewed.  Constitutional:      General: He is not in acute distress. HENT:     Head: Normocephalic and atraumatic.  Eyes:     General:        Right eye: No discharge.        Left eye: No discharge.  Cardiovascular:     Rate and Rhythm: Normal rate and regular rhythm.     Pulses: Normal pulses.     Heart sounds: Normal heart sounds.  Pulmonary:     Effort: Pulmonary effort is normal. No respiratory distress.     Breath sounds: Normal breath sounds. No wheezing.  Abdominal:      General: Bowel sounds are normal.     Palpations: Abdomen is soft.  Musculoskeletal:        General: Tenderness present. No swelling or deformity.     Cervical back: Neck supple.     Left hip: Tenderness present. No crepitus. Normal strength.     Right lower leg: No edema.     Left lower leg: No edema.     Comments: Tenderness over right sciatic/ piriformis. No apparent bruising or deformity. No internal/external rotation of right leg.   Neurological:     Mental Status: He is alert.     Labs reviewed: Recent Labs    05/22/23 0420 05/23/23 0413 06/06/23 0849 06/16/23 0536 06/17/23 0517 06/18/23 0434 06/19/23 0514 06/20/23 0458  NA 137 134*   < > 137   < > 136 134* 137  K 4.0 3.7   < > 3.9   < > 3.9 3.9 4.2  CL 108 101   < > 104   < > 106 106 105  CO2 23 25   < > 25   < > 21* 22 24  GLUCOSE 93 97   < > 92   < > 91 89 86  BUN 15 21   < > 17   < > 21 24* 21  CREATININE 0.92 0.91   < > 0.92   < > 0.66 0.85 0.89  CALCIUM 9.2 8.9   < > 8.8*   < > 9.2 9.0 9.2  MG 2.3 2.1  --  2.3  --   --   --  2.4  PHOS 2.3* 2.4*  --  3.7  --   --   --   --    < > = values in this interval not displayed.   Recent Labs    06/06/23 0849 06/14/23 1246 06/16/23 0536 06/20/23 0458  AST 27 27  --  29  ALT 22 24  --  21  ALKPHOS 80 69  --  83  BILITOT 0.6 0.6  --  0.4  PROT 6.7 5.6*  --  6.1*  ALBUMIN 3.9 3.0* 3.0* 3.2*   Recent Labs  06/18/23 0434 06/19/23 0514 06/20/23 0458  WBC 3.8* 2.8* 2.8*  NEUTROABS 2.8 2.1 2.0  HGB 10.4* 11.9* 10.7*  HCT 31.9* 35.9* 32.9*  MCV 111.9* 110.1* 111.9*  PLT 23* 9* 24*   Lab Results  Component Value Date   TSH 0.173 (L) 05/20/2023   Lab Results  Component Value Date   HGBA1C 4.3 (L) 05/06/2023   Lab Results  Component Value Date   CHOL 178 02/13/2023   HDL 42 02/13/2023   LDLCALC 113 02/13/2023   LDLDIRECT 155.8 11/13/2012   TRIG 167 (A) 02/13/2023   CHOLHDL 4 09/06/2017    Significant Diagnostic Results in last 30 days:  DG  CHEST PORT 1 VIEW  Result Date: 06/20/2023 CLINICAL DATA:  Shortness of breath EXAM: PORTABLE CHEST 1 VIEW COMPARISON:  Rib series 06/14/2023, chest radiograph and CT 05/20/2023 FINDINGS: The cardiomediastinal silhouette is stable. There is unchanged asymmetric elevation of the right hemidiaphragm and retrocardiac opacity consistent with a moderate-sized hiatal hernia. There is no new or worsening focal airspace disease. There is no pulmonary edema. There is no pleural effusion or pneumothorax Multiple subacute to remote bilateral rib fractures are noted. There is no new acute osseous abnormality. IMPRESSION: Unchanged asymmetric elevation of the right hemidiaphragm and moderate-sized hiatal hernia. No new or worsening focal airspace disease. Electronically Signed   By: Lesia Hausen M.D.   On: 06/20/2023 14:10   CT ABDOMEN PELVIS W CONTRAST  Result Date: 06/16/2023 CLINICAL DATA:  Acute, non localized abdominal pain. Recent fall. Clinical concern for intra-abdominal or retroperitoneal hemorrhage. History of multiple myeloma. EXAM: CT ABDOMEN AND PELVIS WITH CONTRAST TECHNIQUE: Multidetector CT imaging of the abdomen and pelvis was performed using the standard protocol following bolus administration of intravenous contrast. RADIATION DOSE REDUCTION: This exam was performed according to the departmental dose-optimization program which includes automated exposure control, adjustment of the mA and/or kV according to patient size and/or use of iterative reconstruction technique. CONTRAST:  OMNIPAQUE IOHEXOL 300 MG/ML  SOLN COMPARISON:  05/05/2023 FINDINGS: Lower chest: Interval minimal left pleural effusion. A large hiatal hernia is again demonstrated in the left lower chest medially. Mild right lower lobe atelectasis with mild progression. Stable mild left lower lobe atelectasis. Hepatobiliary: 3 mm gallstone in the gallbladder. No gallbladder thickening or pericholecystic fluid. Diffuse low density of the  liver relative to the spleen. Pancreas: Unremarkable. No pancreatic ductal dilatation or surrounding inflammatory changes. Spleen: Normal in size without focal abnormality. Adrenals/Urinary Tract: Normal-appearing adrenal glands. Several small left renal simple cysts. These do not need imaging follow-up. Normal-appearing right kidney, ureters and urinary bladder. Stomach/Bowel: Large hiatal hernia. Mild sigmoid colon diverticulosis without evidence of diverticulitis. Prominent stool in the right colon and proximal hepatic flexure. Colon interposition on the right. Normal-appearing appendix. Unremarkable small bowel. Vascular/Lymphatic: Atheromatous arterial calcifications without aneurysm. No enlarged lymph nodes. Reproductive: Prostate is unremarkable. Other: Small to moderate-sized right and small left inguinal hernias. No intraperitoneal hemorrhage. Musculoskeletal: No retroperitoneal hemorrhage. Multiple yield, healing and acute left lower rib fractures. Mild-to-moderate right hip degenerative changes. Lumbar and lower thoracic spine degenerative changes. Stable old T12 compression deformity with no acute vertebral fractures or subluxations. IMPRESSION: 1. No intraperitoneal or retroperitoneal hemorrhage. 2. Multiple acute, subacute and healing left lower rib fractures. 3. Interval minimal left pleural effusion. 4. Large hiatal hernia. 5. Cholelithiasis. 6. Diffuse hepatic steatosis. 7. Mild sigmoid colon diverticulosis. 8. Small to moderate-sized right and small left inguinal hernias containing fat. Electronically Signed   By: Zada Finders.D.  On: 06/16/2023 15:48   DG Ribs Unilateral W/Chest Left  Result Date: 06/14/2023 CLINICAL DATA:  Left rib pain after fall. EXAM: LEFT RIBS AND CHEST - 3+ VIEW COMPARISON:  May 20, 2023. FINDINGS: Mildly displaced acute fractures are seen involving the left sixth and seventh ribs laterally. Subacute to old left eighth rib fracture is noted. There is no evidence  of pneumothorax or pleural effusion. Both lungs are clear. Heart size and mediastinal contours are within normal limits. IMPRESSION: Mildly displaced left sixth and seventh rib fractures. Electronically Signed   By: Lupita Raider M.D.   On: 06/14/2023 15:54    Assessment/Plan: 1. Acute right hip pain - 10/08 sat on pipe abruptly while trying to use bathroom - increased right hip/pelvic pain after - tenderness over sciatic/piriformis, WBAT, no internal/external rotation  - Xray right hip/pelvis> negative for fracture - cont scheduled tylenol - start lidocaine 4% patch- daily x 14 days  2. Closed fracture of multiple ribs of left side with routine healing, subsequent encounter - due to mechanical fall - 09/26 xray revealed 6th and 7th fractures - cont scheduled tylenol  3. Frequent falls - see above - now in rehab due to weakness  - cont PT/OT    Family/ staff Communication: plan discussed with patient, wife, and nurse  Labs/tests ordered:  xray right hip/pelvis

## 2023-06-27 ENCOUNTER — Inpatient Hospital Stay: Payer: Medicare Other

## 2023-06-27 ENCOUNTER — Inpatient Hospital Stay: Payer: Medicare Other | Attending: Oncology

## 2023-06-27 VITALS — BP 125/73 | HR 72 | Temp 97.2°F | Resp 18

## 2023-06-27 DIAGNOSIS — D696 Thrombocytopenia, unspecified: Secondary | ICD-10-CM

## 2023-06-27 DIAGNOSIS — C9000 Multiple myeloma not having achieved remission: Secondary | ICD-10-CM | POA: Insufficient documentation

## 2023-06-27 DIAGNOSIS — D801 Nonfamilial hypogammaglobulinemia: Secondary | ICD-10-CM | POA: Insufficient documentation

## 2023-06-27 LAB — CBC WITH DIFFERENTIAL (CANCER CENTER ONLY)
Abs Immature Granulocytes: 0.02 10*3/uL (ref 0.00–0.07)
Basophils Absolute: 0 10*3/uL (ref 0.0–0.1)
Basophils Relative: 0 %
Eosinophils Absolute: 0.1 10*3/uL (ref 0.0–0.5)
Eosinophils Relative: 3 %
HCT: 34.9 % — ABNORMAL LOW (ref 39.0–52.0)
Hemoglobin: 11.9 g/dL — ABNORMAL LOW (ref 13.0–17.0)
Immature Granulocytes: 1 %
Lymphocytes Relative: 13 %
Lymphs Abs: 0.4 10*3/uL — ABNORMAL LOW (ref 0.7–4.0)
MCH: 37.3 pg — ABNORMAL HIGH (ref 26.0–34.0)
MCHC: 34.1 g/dL (ref 30.0–36.0)
MCV: 109.4 fL — ABNORMAL HIGH (ref 80.0–100.0)
Monocytes Absolute: 0.4 10*3/uL (ref 0.1–1.0)
Monocytes Relative: 11 %
Neutro Abs: 2.5 10*3/uL (ref 1.7–7.7)
Neutrophils Relative %: 72 %
Platelet Count: 32 10*3/uL — ABNORMAL LOW (ref 150–400)
RBC: 3.19 MIL/uL — ABNORMAL LOW (ref 4.22–5.81)
RDW: 21.6 % — ABNORMAL HIGH (ref 11.5–15.5)
WBC Count: 3.5 10*3/uL — ABNORMAL LOW (ref 4.0–10.5)
nRBC: 0 % (ref 0.0–0.2)

## 2023-06-27 LAB — PHOSPHORUS: Phosphorus: 2.8 mg/dL (ref 2.5–4.6)

## 2023-06-27 LAB — CMP (CANCER CENTER ONLY)
ALT: 15 U/L (ref 0–44)
AST: 22 U/L (ref 15–41)
Albumin: 4.3 g/dL (ref 3.5–5.0)
Alkaline Phosphatase: 108 U/L (ref 38–126)
Anion gap: 9 (ref 5–15)
BUN: 16 mg/dL (ref 8–23)
CO2: 25 mmol/L (ref 22–32)
Calcium: 9.9 mg/dL (ref 8.9–10.3)
Chloride: 105 mmol/L (ref 98–111)
Creatinine: 0.93 mg/dL (ref 0.61–1.24)
GFR, Estimated: >60 mL/min (ref >60)
Glucose, Bld: 114 mg/dL — ABNORMAL HIGH (ref 70–99)
Potassium: 3.7 mmol/L (ref 3.5–5.1)
Sodium: 139 mmol/L (ref 135–145)
Total Bilirubin: 0.5 mg/dL (ref 0.3–1.2)
Total Protein: 6.8 g/dL (ref 6.5–8.1)

## 2023-06-27 LAB — MAGNESIUM: Magnesium: 1.9 mg/dL (ref 1.7–2.4)

## 2023-06-27 MED ORDER — LIDOCAINE 4 % EX PTCH: 1.0000 | MEDICATED_PATCH | CUTANEOUS | Status: DC

## 2023-06-27 MED ORDER — ROMIPLOSTIM 125 MCG ~~LOC~~ SOLR
1.0000 ug/kg | Freq: Once | SUBCUTANEOUS | Status: AC
  Administered 2023-06-27: 80 ug via SUBCUTANEOUS
  Filled 2023-06-27: qty 0.16

## 2023-06-27 NOTE — Patient Instructions (Signed)
Romiplostim Injection What is this medication? ROMIPLOSTIM (roe mi PLOE stim) treats low levels of platelets in your body caused by immune thrombocytopenia (ITP). It is prescribed when other medications have not worked or cannot be tolerated. It may also be used to help people who have been exposed to high doses of radiation. It works by increasing the amount of platelets in your blood. This lowers the risk of bleeding. This medicine may be used for other purposes; ask your health care provider or pharmacist if you have questions. COMMON BRAND NAME(S): Nplate What should I tell my care team before I take this medication? They need to know if you have any of these conditions: Blood clots Myelodysplastic syndrome An unusual or allergic reaction to romiplostim, mannitol, other medications, foods, dyes, or preservatives Pregnant or trying to get pregnant Breast-feeding How should I use this medication? This medication is injected under the skin. It is given by a care team in a hospital or clinic setting. A special MedGuide will be given to you before each treatment. Be sure to read this information carefully each time. Talk to your care team about the use of this medication in children. While it may be prescribed for children as young as newborns for selected conditions, precautions do apply. Overdosage: If you think you have taken too much of this medicine contact a poison control center or emergency room at once. NOTE: This medicine is only for you. Do not share this medicine with others. What if I miss a dose? Keep appointments for follow-up doses. It is important not to miss your dose. Call your care team if you are unable to keep an appointment. What may interact with this medication? Interactions are not expected. This list may not describe all possible interactions. Give your health care provider a list of all the medicines, herbs, non-prescription drugs, or dietary supplements you use. Also  tell them if you smoke, drink alcohol, or use illegal drugs. Some items may interact with your medicine. What should I watch for while using this medication? Visit your care team for regular checks on your progress. You may need blood work done while you are taking this medication. Your condition will be monitored carefully while you are receiving this medication. It is important not to miss any appointments. What side effects may I notice from receiving this medication? Side effects that you should report to your care team as soon as possible: Allergic reactions--skin rash, itching, hives, swelling of the face, lips, tongue, or throat Blood clot--pain, swelling, or warmth in the leg, shortness of breath, chest pain Side effects that usually do not require medical attention (report to your care team if they continue or are bothersome): Dizziness Joint pain Muscle pain Pain in the hands or feet Stomach pain Trouble sleeping This list may not describe all possible side effects. Call your doctor for medical advice about side effects. You may report side effects to FDA at 1-800-FDA-1088. Where should I keep my medication? This medication is given in a hospital or clinic. It will not be stored at home. NOTE: This sheet is a summary. It may not cover all possible information. If you have questions about this medicine, talk to your doctor, pharmacist, or health care provider.  2024 Elsevier/Gold Standard (2022-01-09 00:00:00)

## 2023-07-03 ENCOUNTER — Other Ambulatory Visit: Payer: Self-pay | Admitting: Orthopedic Surgery

## 2023-07-03 DIAGNOSIS — S2242XD Multiple fractures of ribs, left side, subsequent encounter for fracture with routine healing: Secondary | ICD-10-CM

## 2023-07-03 MED ORDER — TRAMADOL HCL 50 MG PO TABS
25.0000 mg | ORAL_TABLET | Freq: Four times a day (QID) | ORAL | 0 refills | Status: DC | PRN
Start: 2023-07-03 — End: 2023-07-10

## 2023-07-06 ENCOUNTER — Inpatient Hospital Stay (HOSPITAL_BASED_OUTPATIENT_CLINIC_OR_DEPARTMENT_OTHER): Payer: Medicare Other | Admitting: Nurse Practitioner

## 2023-07-06 ENCOUNTER — Inpatient Hospital Stay: Payer: Medicare Other

## 2023-07-06 ENCOUNTER — Encounter: Payer: Self-pay | Admitting: Nurse Practitioner

## 2023-07-06 VITALS — BP 121/63 | HR 78 | Temp 98.3°F | Resp 18

## 2023-07-06 VITALS — BP 96/68 | HR 96 | Temp 97.6°F | Resp 18

## 2023-07-06 DIAGNOSIS — D801 Nonfamilial hypogammaglobulinemia: Secondary | ICD-10-CM | POA: Diagnosis not present

## 2023-07-06 DIAGNOSIS — D696 Thrombocytopenia, unspecified: Secondary | ICD-10-CM | POA: Diagnosis not present

## 2023-07-06 DIAGNOSIS — C9000 Multiple myeloma not having achieved remission: Secondary | ICD-10-CM | POA: Diagnosis not present

## 2023-07-06 DIAGNOSIS — D649 Anemia, unspecified: Secondary | ICD-10-CM | POA: Diagnosis not present

## 2023-07-06 LAB — SAMPLE TO BLOOD BANK

## 2023-07-06 LAB — CBC WITH DIFFERENTIAL (CANCER CENTER ONLY)
Abs Immature Granulocytes: 0.04 10*3/uL (ref 0.00–0.07)
Basophils Absolute: 0 10*3/uL (ref 0.0–0.1)
Basophils Relative: 1 %
Eosinophils Absolute: 0.1 10*3/uL (ref 0.0–0.5)
Eosinophils Relative: 3 %
HCT: 30.3 % — ABNORMAL LOW (ref 39.0–52.0)
Hemoglobin: 10.1 g/dL — ABNORMAL LOW (ref 13.0–17.0)
Immature Granulocytes: 1 %
Lymphocytes Relative: 18 %
Lymphs Abs: 0.5 10*3/uL — ABNORMAL LOW (ref 0.7–4.0)
MCH: 37.5 pg — ABNORMAL HIGH (ref 26.0–34.0)
MCHC: 33.3 g/dL (ref 30.0–36.0)
MCV: 112.6 fL — ABNORMAL HIGH (ref 80.0–100.0)
Monocytes Absolute: 0.3 10*3/uL (ref 0.1–1.0)
Monocytes Relative: 8 %
Neutro Abs: 2 10*3/uL (ref 1.7–7.7)
Neutrophils Relative %: 69 %
Platelet Count: 32 10*3/uL — ABNORMAL LOW (ref 150–400)
RBC: 2.69 MIL/uL — ABNORMAL LOW (ref 4.22–5.81)
RDW: 21.1 % — ABNORMAL HIGH (ref 11.5–15.5)
WBC Count: 3 10*3/uL — ABNORMAL LOW (ref 4.0–10.5)
nRBC: 0 % (ref 0.0–0.2)

## 2023-07-06 MED ORDER — DIPHENHYDRAMINE HCL 25 MG PO CAPS
25.0000 mg | ORAL_CAPSULE | Freq: Once | ORAL | Status: AC
  Administered 2023-07-06: 25 mg via ORAL
  Filled 2023-07-06: qty 1

## 2023-07-06 MED ORDER — ROMIPLOSTIM 250 MCG ~~LOC~~ SOLR
2.0000 ug/kg | Freq: Once | SUBCUTANEOUS | Status: AC
  Administered 2023-07-06: 155 ug via SUBCUTANEOUS
  Filled 2023-07-06: qty 0.31

## 2023-07-06 MED ORDER — IMMUNE GLOBULIN (HUMAN) 10 GM/100ML IV SOLN
400.0000 mg/kg | Freq: Once | INTRAVENOUS | Status: AC
  Administered 2023-07-06: 30 g via INTRAVENOUS
  Filled 2023-07-06: qty 300

## 2023-07-06 MED ORDER — ACETAMINOPHEN 325 MG PO TABS
650.0000 mg | ORAL_TABLET | Freq: Once | ORAL | Status: AC
  Administered 2023-07-06: 650 mg via ORAL
  Filled 2023-07-06: qty 2

## 2023-07-06 MED ORDER — DEXTROSE 5 % IV SOLN: INTRAVENOUS | Status: DC

## 2023-07-06 NOTE — Progress Notes (Signed)
[facility] OFFICE PROGRESS NOTE   Diagnosis: Multiple myeloma  INTERVAL HISTORY:   [name] returns as scheduled.  He is accompanied by his wife.  No falls.  He denies bleeding.  No fever.  No nausea or vomiting.  Periodic loose stools.  Objective:  Vital signs in last 24 hours:  Blood pressure 96/68, pulse 96, temperature 97.6 F (36.4 C), temperature source Temporal, resp. rate 18, SpO2 96%.    HEENT: No thrush or ulcers.  No bleeding in the mouth. Resp: Lungs clear bilaterally. Cardio: Regular, occasional premature beat. GI: No hepatosplenomegaly. Vascular: Trace bilateral ankle edema.  Lab Results:  Lab Results  Component Value Date   WBC 3.0 (L) 07/06/2023   HGB 10.1 (L) 07/06/2023   HCT 30.3 (L) 07/06/2023   MCV 112.6 (H) 07/06/2023   PLT 32 (L) 07/06/2023   NEUTROABS 2.0 07/06/2023    Imaging:  No results found.  Medications: I have reviewed the patient's current medications.  Assessment/Plan: Multiple myeloma- IgG lambda  bortezomib (subcutaneously), lenalidomide, and dexamethasone, with repeat bone marrow biopsy May of 2012 showing 10% plasmacytosis   (2) high-dose chemotherapy with BCNU and melphalan at [facility], followed by stem cell rescue July of 2012   (3) on zoledronic acid started December of 2012, initially monthly, currently given every 3 months, most recent dose  12/07/2015   (4) low-dose lenalidomide resumed April 2013, interrupted several times.  Resumed again on 02/19/2013, ata dose of 5 mg daily, 21 days on and 7 days off, later further reduced to 7 days on, 7 days off   (5) CNS symptoms and abnormal brain MRI extensively evaluated by neurology with no definitive diagnosis established   (6) rising M spike noted June 2014 but did not meet criteria for carfilzomib study   (7) on lenalidomide 25 mg daily, 14 days on, 7 days off, starting 04/18/2013, interrupted December 2014 because of rash;             (a) resumed January  2015 at 10 mg/ day at 21 days on/ 7 days off             (b) starting 08/18/2014 decreased to 10 mg/ day 14 days on, 7 days off because of cytopenias             (c) as of February 2016 was on 5 mg daily 7 days on 7 days off             (d) lenalidomide discontinued December 2016 with evidence of disease progression   (8) transient global amnesia 05/29/2015, resolved without intervention   (9) starting PVD 10/25/2015 w ASA 325 thromboprophylaxis, valacyclovir prophylaxis, last dose 12/17/2015             (a) pomalidomide 4 mg/d days 1-14             (b) bortezomib sQ days 2,5,9,12 of each 21 day cycle             (c) dexamethasome 20 mg two days a week             (d) dexamethasone bortezomib and pomalidomide discontinued late December 2018 with poor tolerance   (10) metapneumovirus pneumonia April 2017             (a) completing course of steroids and week of bactrim mid April 2017   (11) status post second autologous transplant at [facility] 02/04/2016(preparatory regimen melphalan 200 mg/m)             (  a) received twelve-month vaccinations 03/14/2017 (DPT, Haemophilus, Pneumovax 13, polio)             (b) 14 months injections 05/04/2016 include DTaP, Hib conjugate, HepB energex B 20 mcg/ml, Prevnar 13             (c) 24 month vaccines due at [facility] June 2019   (12) maintenance therapy started November 2017, consisting of             (a) bortezomib 1.3 mg/M2 every 14 days, first dose 07/27/2016             (b) pomalidomide 1 mg days 1-21 Q28 days, started 07/19/2016             (c) zolendronate monthly started 07/27/2016 (previously Q 3 months) however patient unable to tolerate, and changed back to q3 months in April, 2018             (d) Bortezomib changed to monthly as of June 2018 because of tolerance issues, however discontinued after 09/14/2017 dose because of a rise in his M spike             (e) pomalidomide held after 10/18/2017 in preparation for possible study at [facility]  (venetoclax)--never resumed             (f) with numbers actually improved off treatment, resumed every 2-week bortezomib 12/25/2017             (g) changed to every 3-week bortezomib as of 03/17/2019             (h) briefly on weekly treatments times 22 October 2020 due to increase in M spike             (I) maintenance therapy discontinued with evidence of progression   (13) bortezomib/daratumumab/dexamethasone started 12/20/2020.             (a) Decadron dose dropped to 10 mg day of and day following treatment starting May 2022             (b) day 8 bortezomib omitted beginning with the June cycle per patient preference             (C) treatment changed to monthly daratumumab/Velcade/Decadron beginning 06/27/2021-patient decision (14) weekly Velcade/Cytoxan beginning 11/14/2021 (15) changed to Cytoxan/carfilzomib/Decadron 12/19/2021-treatment placed on hold 05/15/2022, carfilzomib 05/24/2022 and 05/31/2022 16.  Leukocyte a pheresis procedure at [facility] 06/15/2022 17.  Treatment resumed with Cytoxan/carfilzomib/Decadron 06/28/2022, last given 09/06/2022 18.  CAR-T 09/26/2022 with Carvykti 19.  CRS and ICANS following CAR-T therapy, status post a Decadron taper completed 10/08/2022 20.  Presentation to the emergency room 10/30/2022 with failure to thrive and a fall  21.  Pancytopenia secondary to multiple myeloma and CAR-T therapy-improved 22.  Bone marrow biopsy 11/13/2022-mildly hypocellular bone marrow with relative erythroid hyperplasia and decreased megakaryocytes; no plasma cells identified by differential count or CD138 immunohistochemical stain; flow cytometry negative for a clonal plasma cell population.   bone marrow biopsy 05/10/2023-mildly hypercellular marrow with mild decrease in megakaryocytes, plasma cells not increased, absent iron stores, 46 XY, negative myeloma FISH panel, negative myeloid panel 23.  Cognitive impairment predating CAR-T therapy and worsened following CAR-T  therapy 24.  Admission 05/05/2023 with Pseudomonas sepsis CTs chest, abdomen, and pelvis-right lung base atelectasis, no infiltrate 25.  Admission 05/20/2023 with hypoxia, altered mental status, and fever Infectious disease evaluation on hospital admission consistent with a urinary tract infection, urine culture positive for Pseudomonas aeruginosa 26.  Hypogammaglobulinemia secondary to multiple  myeloma and CAR-T therapy Monthly IVIG 27.  Admission 06/14/2023 with severe anemia, hypotension, and report of a positive blood culture from 06/13/2023?  With gram-positive cocci in clusters, recent fall with a plain x-ray 06/14/2023 confirming left sixth and seventh rib fractures 28.  Thrombocytopenia-possibly bone marrow failure from extensive course of treatment, possible treatment related MDS, immune thrombocytopenia, atypical infection 06/20/2023 CMV and parvovirus negative  weekly Nplate beginning 06/20/2023 Nplate dose escalated per protocol 07/06/2023  Disposition: [name] appears stable.  We reviewed the CBC from today.  Platelet count stable at 32,000.  Nplate will be dose escalated per protocol.  He understands to contact the office with bleeding.  Hemoglobin and white count stable.  Plan for as needed red cell transfusion support.  He has hypogammaglobulinemia secondary to multiple myeloma and CAR-T therapy.  He will continue monthly IVIG, infusion today.  He will return for weekly labs and Nplate.  We will see him in follow-up in 4 weeks with the next IVIG infusion.  We are available to see him sooner if needed.    Lonna Cobb ANP/GNP-BC   07/06/2023  9:41 AM

## 2023-07-06 NOTE — Progress Notes (Signed)
Patient seen by Lonna Cobb NP today  Vitals are within treatment parameters:Yes   Labs are within treatment parameters:  Plts 32 its ok to proceed    Treatment plan has been signed: Yes   Per physician team, Patient is ready for treatment and there are modifications to the treatment plan. Nplate will be dose escalated per protocol.

## 2023-07-08 LAB — IGG, IGA, IGM
IgA: <5 mg/dL — ABNORMAL LOW (ref 61–437)
IgG (Immunoglobin G), Serum: 476 mg/dL — ABNORMAL LOW (ref 603–1613)
IgM (Immunoglobulin M), Srm: <5 mg/dL — ABNORMAL LOW (ref 15–143)

## 2023-07-09 ENCOUNTER — Encounter: Payer: Self-pay | Admitting: Adult Health

## 2023-07-09 ENCOUNTER — Non-Acute Institutional Stay (SKILLED_NURSING_FACILITY): Payer: Self-pay | Admitting: Adult Health

## 2023-07-09 DIAGNOSIS — I951 Orthostatic hypotension: Secondary | ICD-10-CM | POA: Diagnosis not present

## 2023-07-09 DIAGNOSIS — R338 Other retention of urine: Secondary | ICD-10-CM

## 2023-07-09 DIAGNOSIS — N401 Enlarged prostate with lower urinary tract symptoms: Secondary | ICD-10-CM

## 2023-07-09 LAB — CBC AND DIFFERENTIAL
HCT: 26 — AB (ref 41–53)
Hemoglobin: 9 — AB (ref 13.5–17.5)
Platelets: 24 10*3/uL — AB (ref 150–400)
WBC: 2

## 2023-07-09 LAB — CBC: RBC: 2.29 — AB (ref 3.87–5.11)

## 2023-07-09 NOTE — Progress Notes (Signed)
This encounter was created in error - please disregard.

## 2023-07-09 NOTE — Progress Notes (Unsigned)
Location:  [facility] Room Number: 153A Place of Service:  SNF [phone]) Provider:  Tamsen Roers, MD  Patient Care Team: [name], MD as PCP - General (Internal Medicine) Shirlean Schlein, MD as Consulting Physician (Neurology) Eddie Candle, MD as Consulting Physician (Internal Medicine) York Spaniel, MD (Inactive) as Consulting Physician (Neurology) Sharlett Iles, DDS as Referring Physician (Oral Surgery) Ladene Artist, MD as Consulting Physician (Oncology) Fletcher Anon, NP as Nurse Practitioner (Nurse Practitioner)  Extended Emergency Contact Information Primary Emergency Contact: Tiznado,[name] Address: [address]          [place], [place] [address] [name] Home Phone: [phone] Relation: Spouse  Code Status:  Dnr Goals of care: Advanced Directive information    07/09/2023    4:20 PM  Advanced Directives  Does Patient Have a Medical Advance Directive? Yes  Type of Advance Directive Out of facility DNR (pink MOST or yellow form)  Does patient want to make changes to medical advance directive? No - Patient declined  Would patient like information on creating a medical advance directive? No - Patient declined     Chief Complaint  Patient presents with   Acute Visit    Patient is being seen for orthostatic Hypotension    HPI:  Pt is a 72 y.o. male seen today for an acute visit for orthostatic hypotension   PMH of multiple myeloma (s/p chemo and CAR T therapy, currently receiving IVIG, followed by oncology), chronic pancytopenia, hypothyroidism, and mild cognitive impairment secondary to CART and chronic microvascular changes.   He is currently in skilled rehab after hospitalization. He was sent to the ED 9/26 for evaluation of 1 positive blood culture growing gram positive cocci in clusters at [facility] as well weakness and hypoxia. Recent admissions  9/1-9/6 for UTI with pseudomonas and 8/17-8/23 for pseudomonas bacteremia. Repeat blood cultures at the hospital no growth. He received 5 day IV vancomycin and cefepime. At the hospital, patient was also noted to have acute on chronic anemia with hemoglobin of 6.6 and hypotension. He received 2 units PRBCs during his admission. Left ribs and chest 3+ view revealed "mildly displaced left sixth and seventh rib fractures."  History of heme positive stool, seen by GI 8/14 who thought patient was high risk for colonoscopic evaluation and endoscopy, but willing to move forward if desired by family and deemed appropriate by oncologist/PCP.   He is eating and drinking well. No weight loss  Has weekly CBC on Nplate therapy for thrombocytopenia and also IVIG  He was placed on flomax recently for urinary retention PVR was 101 at the urology office.   Staff reported dizziness when working with PT  Orthostatic bp and pulse   Lying 99/63 77  Sitting 99/63 85  Standing 60/39 106   Past Medical History:  Diagnosis Date   Anemia    Arthritis    Asthma    no treatment x 20 years   Depression    Double vision    occurs at times    Duodenal ulcer    GERD (gastroesophageal reflux disease)    Hemorrhage of rectum and anus 09/15/2013   Hyperlipidemia    Hypertension    Hypothyroidism    Leukopenia due to antineoplastic chemotherapy (HCC) 02/22/2017   Multiple myeloma 07/04/2011   Thyroid disease    TIA (transient ischemic attack) 03/27/2012   Transient diplopia 03/27/2012   Transient global amnesia 06/07/2015   Past  Surgical History:  Procedure Laterality Date   BONE MARROW TRANSPLANT  2011   for MM   CARDIOLITE STUDY  11/25/2003   NORMAL   TONSILLECTOMY      Allergies  Allergen Reactions   Atorvastatin Other (See Comments)    (LIPITOR) "Allergic," per MAR   Rosuvastatin Other (See Comments)    (CRESTOR) Liver Disorder (adverse effects) and "Allergic," per MAR   Septra  [Sulfamethoxazole-Trimethoprim] Rash and Other (See Comments)    "Allergic," per [facility]    Outpatient Encounter Medications as of 07/09/2023  Medication Sig   acetaminophen (TYLENOL) 500 MG tablet Take 2 tablets (1,000 mg total) by mouth every 8 (eight) hours.   CALCIUM 500 + D3 500-5 MG-MCG TABS Take 1 tablet by mouth in the morning.   calcium carbonate (TUMS EX) 750 MG chewable tablet Chew 2 tablets by mouth 3 (three) times daily as needed (for indigestion).   Cholecalciferol (VITAMIN D3) 25 MCG (1000 UT) capsule Take 1,000 Units by mouth daily.   cyanocobalamin (VITAMIN B12) 1000 MCG tablet Take 1 tablet (1,000 mcg total) by mouth daily.   dapsone 100 MG tablet Take 100 mg by mouth daily.   docusate sodium (COLACE) 100 MG capsule Take 100 mg by mouth daily as needed (for constipation).   escitalopram (LEXAPRO) 10 MG tablet Take 1 tablet (10 mg total) by mouth daily.   ferrous sulfate 325 (65 FE) MG EC tablet Take 325 mg by mouth 2 (two) times daily.   folic acid (FOLVITE) 1 MG tablet Take 1 tablet (1 mg total) by mouth daily.   levothyroxine (SYNTHROID) 125 MCG tablet Take 125 mcg by mouth daily before breakfast.   Menthol-Methyl Salicylate (MUSCLE RUB) 10-15 % CREA Apply 1 Application topically as needed for muscle pain.   methylphenidate (RITALIN) 5 MG tablet Take 0.5 tablets (2.5 mg total) by mouth every morning.   ondansetron (ZOFRAN) 4 MG tablet Take 1 tablet (4 mg total) by mouth every 6 (six) hours as needed for nausea.   pantoprazole (PROTONIX) 40 MG tablet Take 1 tablet (40 mg total) by mouth daily.   tamsulosin (FLOMAX) 0.4 MG CAPS capsule Take 0.4 mg by mouth daily.   valACYclovir (VALTREX) 500 MG tablet Take 500 mg by mouth daily.   lidocaine 4 % Place 1 patch onto the skin daily for 14 days. (Patient not taking: Reported on 07/06/2023)   nystatin (MYCOSTATIN) 100000 UNIT/ML suspension Take 5 mLs (500,000 Units total) by mouth 4 (four) times daily. (Patient not taking: Reported on  07/09/2023)   traMADol (ULTRAM) 50 MG tablet Take 0.5 tablets (25 mg total) by mouth every 6 (six) hours as needed for up to 7 days. (Patient not taking: Reported on 07/06/2023)   [DISCONTINUED] prochlorperazine (COMPAZINE) 10 MG tablet Take 1 tablet (10 mg total) by mouth every 6 (six) hours as needed (Nausea or vomiting).   No facility-administered encounter medications on file as of 07/09/2023.    Review of Systems  Constitutional:  Negative for activity change, appetite change, chills, diaphoresis, fatigue, fever and unexpected weight change.  Respiratory:  Negative for cough, shortness of breath, wheezing and stridor.   Cardiovascular:  Positive for leg swelling. Negative for chest pain and palpitations.  Gastrointestinal:  Negative for abdominal distention, abdominal pain, constipation and diarrhea.  Genitourinary:  Negative for difficulty urinating and dysuria.  Musculoskeletal:  Positive for gait problem. Negative for arthralgias, back pain, joint swelling and myalgias.  Neurological:  Positive for dizziness. Negative for seizures, syncope, facial asymmetry,  speech difficulty, weakness and headaches.  Hematological:  Negative for adenopathy. Does not bruise/bleed easily.  Psychiatric/Behavioral:  Positive for confusion. Negative for agitation and behavioral problems.     Immunization History  Administered Date(s) Administered   DTaP / Hep B / IPV 05/04/2017   DTaP / HiB / IPV 03/06/2018   Fluad Quad(high Dose 65+) 06/09/2019, 06/25/2020, 07/25/2021, 05/30/2022   Fluad Trivalent(High Dose 65+) 08/04/2012, 07/03/2014, 06/16/2023   HIB (PRP-T) 05/04/2017   Hepatitis B, ADULT 03/14/2017, 03/06/2018   IPV 01/28/1998   Influenza, High Dose Seasonal PF 06/16/2018   Influenza,inj,Quad PF,6+ Mos 06/16/2013, 07/03/2014, 06/11/2015, 05/31/2017   Influenza-Unspecified 07/29/2012   PFIZER(Purple Top)SARS-COV-2 Vaccination 10/26/2019, 11/20/2019, 07/22/2020   Pfizer Covid-19 Vaccine  Bivalent Booster 75yrs & up 06/27/2021   Pfizer(Comirnaty)Fall Seasonal Vaccine 12 years and older 07/05/2022   Pneumococcal Conjugate-13 10/15/2013, 05/04/2017, 03/06/2018   Pneumococcal Polysaccharide-23 06/16/2013   Respiratory Syncytial Virus Vaccine,Recomb Aduvanted(Arexvy) 05/30/2022   Td 02/10/1998, 07/03/2014   Pertinent  Health Maintenance Due  Topic Date Due   Colonoscopy  09/24/2023   INFLUENZA VACCINE  Completed      08/30/2022   11:01 AM 09/06/2022   10:00 AM 02/15/2023    1:29 PM 06/11/2023   11:20 AM 06/27/2023   10:37 AM  Fall Risk  Falls in the past year?   1 0 1  Was there an injury with Fall?   1 0 1  Fall Risk Category Calculator   3 0 3  (RETIRED) Patient Fall Risk Level Low fall risk Low fall risk     Patient at Risk for Falls Due to    History of fall(s) History of fall(s);Impaired balance/gait;Impaired mobility  Fall risk Follow up   Falls evaluation completed Falls evaluation completed Falls evaluation completed;Education provided;Falls prevention discussed   Functional Status Survey:    Vitals:   07/09/23 1619  BP: (!) 148/75  Pulse: 82  Resp: 16  Temp: 98.1 F (36.7 C)  TempSrc: Temporal  SpO2: 90%  Weight: 172 lb 9.6 oz (78.3 kg)  Height: 5\' 7"  (1.702 m)   Body mass index is 27.03 kg/m. Physical Exam Vitals and nursing note reviewed.  Constitutional:      General: He is not in acute distress.    Appearance: He is not diaphoretic.  HENT:     Head: Normocephalic and atraumatic.  Neck:     Thyroid: No thyromegaly.     Vascular: No JVD.     Trachea: No tracheal deviation.  Cardiovascular:     Rate and Rhythm: Normal rate and regular rhythm.     Heart sounds: No murmur heard. Pulmonary:     Effort: Pulmonary effort is normal. No respiratory distress.     Breath sounds: Normal breath sounds. No wheezing.  Abdominal:     General: Bowel sounds are normal. There is no distension.     Palpations: Abdomen is soft.     Tenderness: There is  no abdominal tenderness.  Musculoskeletal:     Comments: BLE edema +1  Lymphadenopathy:     Cervical: No cervical adenopathy.  Skin:    General: Skin is warm and dry.  Neurological:     Mental Status: He is alert and oriented to person, place, and time.     Cranial Nerves: No cranial nerve deficit.     Labs reviewed: Recent Labs    05/23/23 0413 06/06/23 1610 06/16/23 0536 06/17/23 0517 06/19/23 0514 06/20/23 0458 06/27/23 0933  NA 134*   < > 137   < >  134* 137 139  K 3.7   < > 3.9   < > 3.9 4.2 3.7  CL 101   < > 104   < > 106 105 105  CO2 25   < > 25   < > 22 24 25   GLUCOSE 97   < > 92   < > 89 86 114*  BUN 21   < > 17   < > 24* 21 16  CREATININE 0.91   < > 0.92   < > 0.85 0.89 0.93  CALCIUM 8.9   < > 8.8*   < > 9.0 9.2 9.9  MG 2.1  --  2.3  --   --  2.4 1.9  PHOS 2.4*  --  3.7  --   --   --  2.8   < > = values in this interval not displayed.   Recent Labs    06/14/23 1246 06/16/23 0536 06/20/23 0458 06/27/23 0933  AST 27  --  29 22  ALT 24  --  21 15  ALKPHOS 69  --  83 108  BILITOT 0.6  --  0.4 0.5  PROT 5.6*  --  6.1* 6.8  ALBUMIN 3.0* 3.0* 3.2* 4.3   Recent Labs    06/20/23 0458 06/27/23 0933 07/06/23 0841  WBC 2.8* 3.5* 3.0*  NEUTROABS 2.0 2.5 2.0  HGB 10.7* 11.9* 10.1*  HCT 32.9* 34.9* 30.3*  MCV 111.9* 109.4* 112.6*  PLT 24* 32* 32*   Lab Results  Component Value Date   TSH 0.173 (L) 05/20/2023   Lab Results  Component Value Date   HGBA1C 4.3 (L) 05/06/2023   Lab Results  Component Value Date   CHOL 178 02/13/2023   HDL 42 02/13/2023   LDLCALC 113 02/13/2023   LDLDIRECT 155.8 11/13/2012   TRIG 167 (A) 02/13/2023   CHOLHDL 4 09/06/2017    Significant Diagnostic Results in last 30 days:  DG CHEST PORT 1 VIEW  Result Date: 06/20/2023 CLINICAL DATA:  Shortness of breath EXAM: PORTABLE CHEST 1 VIEW COMPARISON:  Rib series 06/14/2023, chest radiograph and CT 05/20/2023 FINDINGS: The cardiomediastinal silhouette is stable. There is  unchanged asymmetric elevation of the right hemidiaphragm and retrocardiac opacity consistent with a moderate-sized hiatal hernia. There is no new or worsening focal airspace disease. There is no pulmonary edema. There is no pleural effusion or pneumothorax Multiple subacute to remote bilateral rib fractures are noted. There is no new acute osseous abnormality. IMPRESSION: Unchanged asymmetric elevation of the right hemidiaphragm and moderate-sized hiatal hernia. No new or worsening focal airspace disease. Electronically Signed   By: Lesia Hausen M.D.   On: 06/20/2023 14:10   CT ABDOMEN PELVIS W CONTRAST  Result Date: 06/16/2023 CLINICAL DATA:  Acute, non localized abdominal pain. Recent fall. Clinical concern for intra-abdominal or retroperitoneal hemorrhage. History of multiple myeloma. EXAM: CT ABDOMEN AND PELVIS WITH CONTRAST TECHNIQUE: Multidetector CT imaging of the abdomen and pelvis was performed using the standard protocol following bolus administration of intravenous contrast. RADIATION DOSE REDUCTION: This exam was performed according to the departmental dose-optimization program which includes automated exposure control, adjustment of the mA and/or kV according to patient size and/or use of iterative reconstruction technique. CONTRAST:  OMNIPAQUE IOHEXOL 300 MG/ML  SOLN COMPARISON:  05/05/2023 FINDINGS: Lower chest: Interval minimal left pleural effusion. A large hiatal hernia is again demonstrated in the left lower chest medially. Mild right lower lobe atelectasis with mild progression. Stable mild left lower lobe atelectasis. Hepatobiliary:  3 mm gallstone in the gallbladder. No gallbladder thickening or pericholecystic fluid. Diffuse low density of the liver relative to the spleen. Pancreas: Unremarkable. No pancreatic ductal dilatation or surrounding inflammatory changes. Spleen: Normal in size without focal abnormality. Adrenals/Urinary Tract: Normal-appearing adrenal glands. Several small  left renal simple cysts. These do not need imaging follow-up. Normal-appearing right kidney, ureters and urinary bladder. Stomach/Bowel: Large hiatal hernia. Mild sigmoid colon diverticulosis without evidence of diverticulitis. Prominent stool in the right colon and proximal hepatic flexure. Colon interposition on the right. Normal-appearing appendix. Unremarkable small bowel. Vascular/Lymphatic: Atheromatous arterial calcifications without aneurysm. No enlarged lymph nodes. Reproductive: Prostate is unremarkable. Other: Small to moderate-sized right and small left inguinal hernias. No intraperitoneal hemorrhage. Musculoskeletal: No retroperitoneal hemorrhage. Multiple yield, healing and acute left lower rib fractures. Mild-to-moderate right hip degenerative changes. Lumbar and lower thoracic spine degenerative changes. Stable old T12 compression deformity with no acute vertebral fractures or subluxations. IMPRESSION: 1. No intraperitoneal or retroperitoneal hemorrhage. 2. Multiple acute, subacute and healing left lower rib fractures. 3. Interval minimal left pleural effusion. 4. Large hiatal hernia. 5. Cholelithiasis. 6. Diffuse hepatic steatosis. 7. Mild sigmoid colon diverticulosis. 8. Small to moderate-sized right and small left inguinal hernias containing fat. Electronically Signed   By: Beckie Salts M.D.   On: 06/16/2023 15:48   DG Ribs Unilateral W/Chest Left  Result Date: 06/14/2023 CLINICAL DATA:  Left rib pain after fall. EXAM: LEFT RIBS AND CHEST - 3+ VIEW COMPARISON:  May 20, 2023. FINDINGS: Mildly displaced acute fractures are seen involving the left sixth and seventh ribs laterally. Subacute to old left eighth rib fracture is noted. There is no evidence of pneumothorax or pleural effusion. Both lungs are clear. Heart size and mediastinal contours are within normal limits. IMPRESSION: Mildly displaced left sixth and seventh rib fractures. Electronically Signed   By: Lupita Raider M.D.   On:  06/14/2023 15:54    Assessment/Plan  1. Orthostatic hypotension Encourage fluids Encourage him to wear compression hose Rise slowly Hold flomax x 2 days then repeat orthostatic vitals to ascertain the cause of his hypotension.  2. Urinary retention due to benign prostatic hyperplasia No current symptoms Followed by urology Holding flomax for two days

## 2023-07-10 ENCOUNTER — Ambulatory Visit: Payer: Medicare Other | Admitting: Physician Assistant

## 2023-07-10 ENCOUNTER — Encounter: Payer: Self-pay | Admitting: Adult Health

## 2023-07-12 ENCOUNTER — Telehealth: Payer: Self-pay | Admitting: Adult Health

## 2023-07-12 MED ORDER — MIDODRINE HCL 2.5 MG PO TABS: 2.5000 mg | ORAL_TABLET | Freq: Two times a day (BID) | ORAL | Status: DC

## 2023-07-12 NOTE — Telephone Encounter (Signed)
Mr Glade continues to have orthostatic hypotension despite holding the flomax.  He is eating and drinking well. Has labs drawn weekly.  Will add midodrine, and resume flomax.

## 2023-07-13 ENCOUNTER — Inpatient Hospital Stay: Payer: Medicare Other

## 2023-07-13 VITALS — BP 94/64 | HR 87 | Temp 97.6°F | Resp 16

## 2023-07-13 DIAGNOSIS — D696 Thrombocytopenia, unspecified: Secondary | ICD-10-CM

## 2023-07-13 DIAGNOSIS — D801 Nonfamilial hypogammaglobulinemia: Secondary | ICD-10-CM | POA: Diagnosis not present

## 2023-07-13 DIAGNOSIS — D649 Anemia, unspecified: Secondary | ICD-10-CM

## 2023-07-13 DIAGNOSIS — C9000 Multiple myeloma not having achieved remission: Secondary | ICD-10-CM

## 2023-07-13 LAB — CBC WITH DIFFERENTIAL (CANCER CENTER ONLY)
Abs Immature Granulocytes: 0.03 10*3/uL (ref 0.00–0.07)
Basophils Absolute: 0 10*3/uL (ref 0.0–0.1)
Basophils Relative: 1 %
Eosinophils Absolute: 0.1 10*3/uL (ref 0.0–0.5)
Eosinophils Relative: 2 %
HCT: 30.7 % — ABNORMAL LOW (ref 39.0–52.0)
Hemoglobin: 10.2 g/dL — ABNORMAL LOW (ref 13.0–17.0)
Immature Granulocytes: 1 %
Lymphocytes Relative: 23 %
Lymphs Abs: 0.7 10*3/uL (ref 0.7–4.0)
MCH: 38.3 pg — ABNORMAL HIGH (ref 26.0–34.0)
MCHC: 33.2 g/dL (ref 30.0–36.0)
MCV: 115.4 fL — ABNORMAL HIGH (ref 80.0–100.0)
Monocytes Absolute: 0.6 10*3/uL (ref 0.1–1.0)
Monocytes Relative: 21 %
Neutro Abs: 1.5 10*3/uL — ABNORMAL LOW (ref 1.7–7.7)
Neutrophils Relative %: 52 %
Platelet Count: 25 10*3/uL — ABNORMAL LOW (ref 150–400)
RBC: 2.66 MIL/uL — ABNORMAL LOW (ref 4.22–5.81)
RDW: 20 % — ABNORMAL HIGH (ref 11.5–15.5)
WBC Count: 2.9 10*3/uL — ABNORMAL LOW (ref 4.0–10.5)
nRBC: 0 % (ref 0.0–0.2)

## 2023-07-13 MED ORDER — ROMIPLOSTIM 250 MCG ~~LOC~~ SOLR
3.0000 ug/kg | Freq: Once | SUBCUTANEOUS | Status: AC
  Administered 2023-07-13: 235 ug via SUBCUTANEOUS
  Filled 2023-07-13: qty 0.47

## 2023-07-13 NOTE — Patient Instructions (Signed)
Romiplostim Injection What is this medication? ROMIPLOSTIM (roe mi PLOE stim) treats low levels of platelets in your body caused by immune thrombocytopenia (ITP). It is prescribed when other medications have not worked or cannot be tolerated. It may also be used to help people who have been exposed to high doses of radiation. It works by increasing the amount of platelets in your blood. This lowers the risk of bleeding. This medicine may be used for other purposes; ask your health care provider or pharmacist if you have questions. COMMON BRAND NAME(S): Nplate What should I tell my care team before I take this medication? They need to know if you have any of these conditions: Blood clots Myelodysplastic syndrome An unusual or allergic reaction to romiplostim, mannitol, other medications, foods, dyes, or preservatives Pregnant or trying to get pregnant Breast-feeding How should I use this medication? This medication is injected under the skin. It is given by a care team in a hospital or clinic setting. A special MedGuide will be given to you before each treatment. Be sure to read this information carefully each time. Talk to your care team about the use of this medication in children. While it may be prescribed for children as young as newborns for selected conditions, precautions do apply. Overdosage: If you think you have taken too much of this medicine contact a poison control center or emergency room at once. NOTE: This medicine is only for you. Do not share this medicine with others. What if I miss a dose? Keep appointments for follow-up doses. It is important not to miss your dose. Call your care team if you are unable to keep an appointment. What may interact with this medication? Interactions are not expected. This list may not describe all possible interactions. Give your health care provider a list of all the medicines, herbs, non-prescription drugs, or dietary supplements you use. Also  tell them if you smoke, drink alcohol, or use illegal drugs. Some items may interact with your medicine. What should I watch for while using this medication? Visit your care team for regular checks on your progress. You may need blood work done while you are taking this medication. Your condition will be monitored carefully while you are receiving this medication. It is important not to miss any appointments. What side effects may I notice from receiving this medication? Side effects that you should report to your care team as soon as possible: Allergic reactions--skin rash, itching, hives, swelling of the face, lips, tongue, or throat Blood clot--pain, swelling, or warmth in the leg, shortness of breath, chest pain Side effects that usually do not require medical attention (report to your care team if they continue or are bothersome): Dizziness Joint pain Muscle pain Pain in the hands or feet Stomach pain Trouble sleeping This list may not describe all possible side effects. Call your doctor for medical advice about side effects. You may report side effects to FDA at 1-800-FDA-1088. Where should I keep my medication? This medication is given in a hospital or clinic. It will not be stored at home. NOTE: This sheet is a summary. It may not cover all possible information. If you have questions about this medicine, talk to your doctor, pharmacist, or health care provider.  2024 Elsevier/Gold Standard (2022-01-09 00:00:00)

## 2023-07-20 ENCOUNTER — Inpatient Hospital Stay: Payer: Medicare Other | Attending: Oncology

## 2023-07-20 ENCOUNTER — Other Ambulatory Visit: Payer: Medicare Other

## 2023-07-20 ENCOUNTER — Inpatient Hospital Stay: Payer: Medicare Other

## 2023-07-20 ENCOUNTER — Ambulatory Visit: Payer: Medicare Other

## 2023-07-20 VITALS — BP 98/59 | HR 77 | Temp 97.0°F | Resp 16

## 2023-07-20 DIAGNOSIS — D696 Thrombocytopenia, unspecified: Secondary | ICD-10-CM

## 2023-07-20 DIAGNOSIS — D649 Anemia, unspecified: Secondary | ICD-10-CM

## 2023-07-20 DIAGNOSIS — C9000 Multiple myeloma not having achieved remission: Secondary | ICD-10-CM | POA: Insufficient documentation

## 2023-07-20 DIAGNOSIS — D801 Nonfamilial hypogammaglobulinemia: Secondary | ICD-10-CM | POA: Insufficient documentation

## 2023-07-20 LAB — CBC WITH DIFFERENTIAL (CANCER CENTER ONLY)
Abs Immature Granulocytes: 0.04 10*3/uL (ref 0.00–0.07)
Basophils Absolute: 0 10*3/uL (ref 0.0–0.1)
Basophils Relative: 1 %
Eosinophils Absolute: 0.1 10*3/uL (ref 0.0–0.5)
Eosinophils Relative: 2 %
HCT: 29.6 % — ABNORMAL LOW (ref 39.0–52.0)
Hemoglobin: 9.9 g/dL — ABNORMAL LOW (ref 13.0–17.0)
Immature Granulocytes: 1 %
Lymphocytes Relative: 14 %
Lymphs Abs: 0.4 10*3/uL — ABNORMAL LOW (ref 0.7–4.0)
MCH: 39 pg — ABNORMAL HIGH (ref 26.0–34.0)
MCHC: 33.4 g/dL (ref 30.0–36.0)
MCV: 116.5 fL — ABNORMAL HIGH (ref 80.0–100.0)
Monocytes Absolute: 0.3 10*3/uL (ref 0.1–1.0)
Monocytes Relative: 11 %
Neutro Abs: 2 10*3/uL (ref 1.7–7.7)
Neutrophils Relative %: 71 %
Platelet Count: 23 10*3/uL — ABNORMAL LOW (ref 150–400)
RBC: 2.54 MIL/uL — ABNORMAL LOW (ref 4.22–5.81)
RDW: 18.7 % — ABNORMAL HIGH (ref 11.5–15.5)
WBC Count: 2.9 10*3/uL — ABNORMAL LOW (ref 4.0–10.5)
nRBC: 0 % (ref 0.0–0.2)

## 2023-07-20 LAB — SAMPLE TO BLOOD BANK

## 2023-07-20 MED ORDER — ROMIPLOSTIM INJECTION 500 MCG
4.0000 ug/kg | Freq: Once | SUBCUTANEOUS | Status: AC
Start: 2023-07-20 — End: 2023-07-20
  Administered 2023-07-20: 315 ug via SUBCUTANEOUS
  Filled 2023-07-20: qty 0.63

## 2023-07-20 NOTE — Patient Instructions (Signed)
Romiplostim Injection What is this medication? ROMIPLOSTIM (roe mi PLOE stim) treats low levels of platelets in your body caused by immune thrombocytopenia (ITP). It is prescribed when other medications have not worked or cannot be tolerated. It may also be used to help people who have been exposed to high doses of radiation. It works by increasing the amount of platelets in your blood. This lowers the risk of bleeding. This medicine may be used for other purposes; ask your health care provider or pharmacist if you have questions. COMMON BRAND NAME(S): Nplate What should I tell my care team before I take this medication? They need to know if you have any of these conditions: Blood clots Myelodysplastic syndrome An unusual or allergic reaction to romiplostim, mannitol, other medications, foods, dyes, or preservatives Pregnant or trying to get pregnant Breast-feeding How should I use this medication? This medication is injected under the skin. It is given by a care team in a hospital or clinic setting. A special MedGuide will be given to you before each treatment. Be sure to read this information carefully each time. Talk to your care team about the use of this medication in children. While it may be prescribed for children as young as newborns for selected conditions, precautions do apply. Overdosage: If you think you have taken too much of this medicine contact a poison control center or emergency room at once. NOTE: This medicine is only for you. Do not share this medicine with others. What if I miss a dose? Keep appointments for follow-up doses. It is important not to miss your dose. Call your care team if you are unable to keep an appointment. What may interact with this medication? Interactions are not expected. This list may not describe all possible interactions. Give your health care provider a list of all the medicines, herbs, non-prescription drugs, or dietary supplements you use. Also  tell them if you smoke, drink alcohol, or use illegal drugs. Some items may interact with your medicine. What should I watch for while using this medication? Visit your care team for regular checks on your progress. You may need blood work done while you are taking this medication. Your condition will be monitored carefully while you are receiving this medication. It is important not to miss any appointments. What side effects may I notice from receiving this medication? Side effects that you should report to your care team as soon as possible: Allergic reactions--skin rash, itching, hives, swelling of the face, lips, tongue, or throat Blood clot--pain, swelling, or warmth in the leg, shortness of breath, chest pain Side effects that usually do not require medical attention (report to your care team if they continue or are bothersome): Dizziness Joint pain Muscle pain Pain in the hands or feet Stomach pain Trouble sleeping This list may not describe all possible side effects. Call your doctor for medical advice about side effects. You may report side effects to FDA at 1-800-FDA-1088. Where should I keep my medication? This medication is given in a hospital or clinic. It will not be stored at home. NOTE: This sheet is a summary. It may not cover all possible information. If you have questions about this medicine, talk to your doctor, pharmacist, or health care provider.  2024 Elsevier/Gold Standard (2022-01-09 00:00:00)

## 2023-07-24 ENCOUNTER — Non-Acute Institutional Stay (SKILLED_NURSING_FACILITY): Payer: Self-pay | Admitting: Orthopedic Surgery

## 2023-07-24 ENCOUNTER — Encounter: Payer: Self-pay | Admitting: Orthopedic Surgery

## 2023-07-24 DIAGNOSIS — K120 Recurrent oral aphthae: Secondary | ICD-10-CM

## 2023-07-24 DIAGNOSIS — B370 Candidal stomatitis: Secondary | ICD-10-CM

## 2023-07-24 MED ORDER — NYSTATIN 100000 UNIT/ML MT SUSP: 5.0000 mL | Freq: Four times a day (QID) | OROMUCOSAL | 0 refills | Status: DC

## 2023-07-24 NOTE — Progress Notes (Signed)
Location:  [facility] Room Number: 153/A Place of Service:  SNF [phone]) Provider:  Octavia Heir, NP   [name], MD  Patient Care Team: [name], MD as PCP - General (Internal Medicine) Shirlean Schlein, MD as Consulting Physician (Neurology) Eddie Candle, MD as Consulting Physician (Internal Medicine) York Spaniel, MD (Inactive) as Consulting Physician (Neurology) Sharlett Iles, DDS as Referring Physician (Oral Surgery) Ladene Artist, MD as Consulting Physician (Oncology) Fletcher Anon, NP as Nurse Practitioner (Nurse Practitioner)  Extended Emergency Contact Information Primary Emergency Contact: Schepers,[name] Address: [address]          [place], [place] [address] [name] of [place] Home Phone: [phone] Relation: Spouse  Code Status:  DNR Goals of care: Advanced Directive information    07/09/2023    4:20 PM  Advanced Directives  Does Patient Have a Medical Advance Directive? Yes  Type of Advance Directive Out of facility DNR (pink MOST or yellow form)  Does patient want to make changes to medical advance directive? No - Patient declined  Would patient like information on creating a medical advance directive? No - Patient declined     Chief Complaint  Patient presents with   Acute Visit    Tongue lesion    HPI:  Pt is a 72 y.o. male seen today for medical management of chronic diseases.    He currently resides on the rehab unit at [facility]. PMH: TIA, asthma, hypoxia OSA, GERD, peptic ulcer disease, hypothyroidism, chemotherapy related neuropathy, multiple myeloma, memory impairment, pancytopenia, weakness and constipation.   Last night he c/o mild pain to middle of anterior tongue. He noticed a small round lesion and with center. No recent injury to mouth. He is followed by oncology due to multiple myeloma. 11/01 received last Romiplostim injection. Afebrile. Vitals stable.     Past Medical History:  Diagnosis Date   Anemia    Arthritis    Asthma    no treatment x 20 years   Depression    Double vision    occurs at times    Duodenal ulcer    GERD (gastroesophageal reflux disease)    Hemorrhage of rectum and anus 09/15/2013   Hyperlipidemia    Hypertension    Hypothyroidism    Leukopenia due to antineoplastic chemotherapy (HCC) 02/22/2017   Multiple myeloma 07/04/2011   Thyroid disease    TIA (transient ischemic attack) 03/27/2012   Transient diplopia 03/27/2012   Transient global amnesia 06/07/2015   Past Surgical History:  Procedure Laterality Date   BONE MARROW TRANSPLANT  2011   for MM   CARDIOLITE STUDY  11/25/2003   NORMAL   TONSILLECTOMY      Allergies  Allergen Reactions   Atorvastatin Other (See Comments)    (LIPITOR) "Allergic," per [facility]   Rosuvastatin Other (See Comments)    (CRESTOR) Liver Disorder (adverse effects) and "Allergic," per [facility]   Septra [Sulfamethoxazole-Trimethoprim] Rash and Other (See Comments)    "Allergic," per [facility]    Outpatient Encounter Medications as of 07/24/2023  Medication Sig   acetaminophen (TYLENOL) 500 MG tablet Take 2 tablets (1,000 mg total) by mouth every 8 (eight) hours.   CALCIUM 500 + D3 500-5 MG-MCG TABS Take 1 tablet by mouth in the morning.   calcium carbonate (TUMS EX) 750 MG chewable tablet Chew 2 tablets by mouth 3 (three) times daily as needed (for indigestion).   Cholecalciferol (VITAMIN D3) 25 MCG (1000 UT) capsule Take  1,000 Units by mouth daily.   cyanocobalamin (VITAMIN B12) 1000 MCG tablet Take 1 tablet (1,000 mcg total) by mouth daily.   dapsone 100 MG tablet Take 100 mg by mouth daily.   docusate sodium (COLACE) 100 MG capsule Take 100 mg by mouth daily as needed (for constipation).   escitalopram (LEXAPRO) 10 MG tablet Take 1 tablet (10 mg total) by mouth daily.   ferrous sulfate 325 (65 FE) MG EC tablet Take 325 mg by mouth 2 (two) times daily.   folic acid (FOLVITE) 1 MG  tablet Take 1 tablet (1 mg total) by mouth daily.   levothyroxine (SYNTHROID) 125 MCG tablet Take 125 mcg by mouth daily before breakfast.   Menthol-Methyl Salicylate (MUSCLE RUB) 10-15 % CREA Apply 1 Application topically as needed for muscle pain.   methylphenidate (RITALIN) 5 MG tablet Take 0.5 tablets (2.5 mg total) by mouth every morning.   midodrine (PROAMATINE) 2.5 MG tablet Take 1 tablet (2.5 mg total) by mouth 2 (two) times daily with a meal.   ondansetron (ZOFRAN) 4 MG tablet Take 1 tablet (4 mg total) by mouth every 6 (six) hours as needed for nausea.   pantoprazole (PROTONIX) 40 MG tablet Take 1 tablet (40 mg total) by mouth daily.   tamsulosin (FLOMAX) 0.4 MG CAPS capsule Take 0.4 mg by mouth daily.   valACYclovir (VALTREX) 500 MG tablet Take 500 mg by mouth daily.   [DISCONTINUED] prochlorperazine (COMPAZINE) 10 MG tablet Take 1 tablet (10 mg total) by mouth every 6 (six) hours as needed (Nausea or vomiting).   No facility-administered encounter medications on file as of 07/24/2023.    Review of Systems  Constitutional:  Negative for activity change and appetite change.  HENT:         Tongue lesion  Respiratory:  Negative for cough, shortness of breath and wheezing.   Cardiovascular:  Negative for chest pain and leg swelling.  Psychiatric/Behavioral:  Positive for confusion. Negative for dysphoric mood. The patient is not nervous/anxious.     Immunization History  Administered Date(s) Administered   DTaP / Hep B / IPV 05/04/2017   DTaP / HiB / IPV 03/06/2018   Fluad Quad(high Dose 65+) 06/09/2019, 06/25/2020, 07/25/2021, 05/30/2022   Fluad Trivalent(High Dose 65+) 08/04/2012, 07/03/2014, 06/16/2023   HIB (PRP-T) 05/04/2017   Hepatitis B, ADULT 03/14/2017, 03/06/2018   IPV 01/28/1998   Influenza, High Dose Seasonal PF 06/16/2018   Influenza,inj,Quad PF,6+ Mos 06/16/2013, 07/03/2014, 06/11/2015, 05/31/2017   Influenza-Unspecified 07/29/2012   PFIZER(Purple Top)SARS-COV-2  Vaccination 10/26/2019, 11/20/2019, 07/22/2020   Pfizer Covid-19 Vaccine Bivalent Booster 80yrs & up 06/27/2021   Pfizer(Comirnaty)Fall Seasonal Vaccine 12 years and older 07/05/2022   Pneumococcal Conjugate-13 10/15/2013, 05/04/2017, 03/06/2018   Pneumococcal Polysaccharide-23 06/16/2013   Respiratory Syncytial Virus Vaccine,Recomb Aduvanted(Arexvy) 05/30/2022   Td 02/10/1998, 07/03/2014   Pertinent  Health Maintenance Due  Topic Date Due   Colonoscopy  09/24/2023   INFLUENZA VACCINE  Completed      08/30/2022   11:01 AM 09/06/2022   10:00 AM 02/15/2023    1:29 PM 06/11/2023   11:20 AM 06/27/2023   10:37 AM  Fall Risk  Falls in the past year?   1 0 1  Was there an injury with Fall?   1 0 1  Fall Risk Category Calculator   3 0 3  (RETIRED) Patient Fall Risk Level Low fall risk Low fall risk     Patient at Risk for Falls Due to    History of fall(s)  History of fall(s);Impaired balance/gait;Impaired mobility  Fall risk Follow up   Falls evaluation completed Falls evaluation completed Falls evaluation completed;Education provided;Falls prevention discussed   Functional Status Survey:    Vitals:   07/24/23 1229  BP: 131/68  Pulse: 64  Resp: 15  Temp: (!) 97 F (36.1 C)  SpO2: 91%  Weight: 175 lb 3.2 oz (79.5 kg)  Height: 5\' 7"  (1.702 m)   Body mass index is 27.44 kg/m. Physical Exam Vitals reviewed.  Constitutional:      General: He is not in acute distress. HENT:     Head: Normocephalic.     Mouth/Throat:     Mouth: Mucous membranes are moist.     Tongue: Lesions present.     Pharynx: Oropharyngeal exudate present.      Comments: Small lesion to right anterior tongue, brownish discoloration with small white center, white patches to back of tongue present.  Eyes:     General:        Right eye: No discharge.        Left eye: No discharge.  Cardiovascular:     Rate and Rhythm: Normal rate and regular rhythm.     Pulses: Normal pulses.     Heart sounds: Normal  heart sounds.  Pulmonary:     Effort: Pulmonary effort is normal.     Breath sounds: Normal breath sounds.  Neurological:     General: No focal deficit present.     Mental Status: He is alert and oriented to person, place, and time.     Motor: Weakness present.     Gait: Gait abnormal.  Psychiatric:        Mood and Affect: Mood normal.     Labs reviewed: Recent Labs    05/23/23 0413 06/06/23 0849 06/16/23 0536 06/17/23 0517 06/19/23 0514 06/20/23 0458 06/27/23 0933  NA 134*   < > 137   < > 134* 137 139  K 3.7   < > 3.9   < > 3.9 4.2 3.7  CL 101   < > 104   < > 106 105 105  CO2 25   < > 25   < > 22 24 25   GLUCOSE 97   < > 92   < > 89 86 114*  BUN 21   < > 17   < > 24* 21 16  CREATININE 0.91   < > 0.92   < > 0.85 0.89 0.93  CALCIUM 8.9   < > 8.8*   < > 9.0 9.2 9.9  MG 2.1  --  2.3  --   --  2.4 1.9  PHOS 2.4*  --  3.7  --   --   --  2.8   < > = values in this interval not displayed.   Recent Labs    06/14/23 1246 06/16/23 0536 06/20/23 0458 06/27/23 0933  AST 27  --  29 22  ALT 24  --  21 15  ALKPHOS 69  --  83 108  BILITOT 0.6  --  0.4 0.5  PROT 5.6*  --  6.1* 6.8  ALBUMIN 3.0* 3.0* 3.2* 4.3   Recent Labs    07/06/23 0841 07/09/23 0000 07/13/23 0918 07/20/23 0908  WBC 3.0* 2.0 2.9* 2.9*  NEUTROABS 2.0  --  1.5* 2.0  HGB 10.1* 9.0* 10.2* 9.9*  HCT 30.3* 26* 30.7* 29.6*  MCV 112.6*  --  115.4* 116.5*  PLT 32* 24* 25* 23*   Lab Results  Component Value Date   TSH 0.173 (L) 05/20/2023   Lab Results  Component Value Date   HGBA1C 4.3 (L) 05/06/2023   Lab Results  Component Value Date   CHOL 178 02/13/2023   HDL 42 02/13/2023   LDLCALC 113 02/13/2023   LDLDIRECT 155.8 11/13/2012   TRIG 167 (A) 02/13/2023   CHOLHDL 4 09/06/2017    Significant Diagnostic Results in last 30 days:  No results found.  Assessment/Plan 1. Canker sores oral - noted to right anterior tongue - will try magic mouth wash for symptoms - magic mouthwash (nystatin,  lidocaine, diphenhydrAMINE, alum & mag hydroxide) suspension; Swish and spit 5 mLs 4 (four) times daily for 7 days.  Dispense: 180 mL; Refill: 0  2. Oral candida - noted to back of tongue during oral exam - susceptible to opportunistic infections due to multiple myeloma - magic mouthwash (nystatin, lidocaine, diphenhydrAMINE, alum & mag hydroxide) suspension; Swish and spit 5 mLs 4 (four) times daily for 7 days.  Dispense: 180 mL; Refill: 0    Family/ staff Communication: plan discussed with patient and nurse  Labs/tests ordered:  none

## 2023-07-25 ENCOUNTER — Encounter: Payer: Self-pay | Admitting: Internal Medicine

## 2023-07-26 ENCOUNTER — Other Ambulatory Visit: Payer: Self-pay | Admitting: Adult Health

## 2023-07-26 DIAGNOSIS — F909 Attention-deficit hyperactivity disorder, unspecified type: Secondary | ICD-10-CM

## 2023-07-26 MED ORDER — METHYLPHENIDATE HCL 5 MG PO TABS: 2.5000 mg | ORAL_TABLET | Freq: Every morning | ORAL | 0 refills | Status: DC

## 2023-07-27 ENCOUNTER — Inpatient Hospital Stay: Payer: Medicare Other

## 2023-07-27 ENCOUNTER — Other Ambulatory Visit: Payer: Self-pay | Admitting: Oncology

## 2023-07-27 ENCOUNTER — Other Ambulatory Visit: Payer: Medicare Other

## 2023-07-27 VITALS — BP 104/59 | HR 79 | Temp 97.1°F | Resp 16

## 2023-07-27 DIAGNOSIS — D696 Thrombocytopenia, unspecified: Secondary | ICD-10-CM

## 2023-07-27 DIAGNOSIS — D649 Anemia, unspecified: Secondary | ICD-10-CM

## 2023-07-27 DIAGNOSIS — D801 Nonfamilial hypogammaglobulinemia: Secondary | ICD-10-CM | POA: Diagnosis not present

## 2023-07-27 DIAGNOSIS — C9000 Multiple myeloma not having achieved remission: Secondary | ICD-10-CM

## 2023-07-27 LAB — CBC WITH DIFFERENTIAL (CANCER CENTER ONLY)
Abs Immature Granulocytes: 0.05 10*3/uL (ref 0.00–0.07)
Basophils Absolute: 0 10*3/uL (ref 0.0–0.1)
Basophils Relative: 1 %
Eosinophils Absolute: 0.1 10*3/uL (ref 0.0–0.5)
Eosinophils Relative: 2 %
HCT: 31.6 % — ABNORMAL LOW (ref 39.0–52.0)
Hemoglobin: 10.4 g/dL — ABNORMAL LOW (ref 13.0–17.0)
Immature Granulocytes: 1 %
Lymphocytes Relative: 16 %
Lymphs Abs: 0.6 10*3/uL — ABNORMAL LOW (ref 0.7–4.0)
MCH: 39 pg — ABNORMAL HIGH (ref 26.0–34.0)
MCHC: 32.9 g/dL (ref 30.0–36.0)
MCV: 118.4 fL — ABNORMAL HIGH (ref 80.0–100.0)
Monocytes Absolute: 0.5 10*3/uL (ref 0.1–1.0)
Monocytes Relative: 14 %
Neutro Abs: 2.4 10*3/uL (ref 1.7–7.7)
Neutrophils Relative %: 66 %
Platelet Count: 26 10*3/uL — ABNORMAL LOW (ref 150–400)
RBC: 2.67 MIL/uL — ABNORMAL LOW (ref 4.22–5.81)
RDW: 17.5 % — ABNORMAL HIGH (ref 11.5–15.5)
WBC Count: 3.7 10*3/uL — ABNORMAL LOW (ref 4.0–10.5)
nRBC: 0 % (ref 0.0–0.2)

## 2023-07-27 MED ORDER — ROMIPLOSTIM INJECTION 500 MCG
5.0000 ug/kg | Freq: Once | SUBCUTANEOUS | Status: AC
  Administered 2023-07-27: 400 ug via SUBCUTANEOUS
  Filled 2023-07-27: qty 0.8

## 2023-07-27 NOTE — Patient Instructions (Signed)
Romiplostim Injection What is this medication? ROMIPLOSTIM (roe mi PLOE stim) treats low levels of platelets in your body caused by immune thrombocytopenia (ITP). It is prescribed when other medications have not worked or cannot be tolerated. It may also be used to help people who have been exposed to high doses of radiation. It works by increasing the amount of platelets in your blood. This lowers the risk of bleeding. This medicine may be used for other purposes; ask your health care provider or pharmacist if you have questions. COMMON BRAND NAME(S): Nplate What should I tell my care team before I take this medication? They need to know if you have any of these conditions: Blood clots Myelodysplastic syndrome An unusual or allergic reaction to romiplostim, mannitol, other medications, foods, dyes, or preservatives Pregnant or trying to get pregnant Breast-feeding How should I use this medication? This medication is injected under the skin. It is given by a care team in a hospital or clinic setting. A special MedGuide will be given to you before each treatment. Be sure to read this information carefully each time. Talk to your care team about the use of this medication in children. While it may be prescribed for children as young as newborns for selected conditions, precautions do apply. Overdosage: If you think you have taken too much of this medicine contact a poison control center or emergency room at once. NOTE: This medicine is only for you. Do not share this medicine with others. What if I miss a dose? Keep appointments for follow-up doses. It is important not to miss your dose. Call your care team if you are unable to keep an appointment. What may interact with this medication? Interactions are not expected. This list may not describe all possible interactions. Give your health care provider a list of all the medicines, herbs, non-prescription drugs, or dietary supplements you use. Also  tell them if you smoke, drink alcohol, or use illegal drugs. Some items may interact with your medicine. What should I watch for while using this medication? Visit your care team for regular checks on your progress. You may need blood work done while you are taking this medication. Your condition will be monitored carefully while you are receiving this medication. It is important not to miss any appointments. What side effects may I notice from receiving this medication? Side effects that you should report to your care team as soon as possible: Allergic reactions--skin rash, itching, hives, swelling of the face, lips, tongue, or throat Blood clot--pain, swelling, or warmth in the leg, shortness of breath, chest pain Side effects that usually do not require medical attention (report to your care team if they continue or are bothersome): Dizziness Joint pain Muscle pain Pain in the hands or feet Stomach pain Trouble sleeping This list may not describe all possible side effects. Call your doctor for medical advice about side effects. You may report side effects to FDA at 1-800-FDA-1088. Where should I keep my medication? This medication is given in a hospital or clinic. It will not be stored at home. NOTE: This sheet is a summary. It may not cover all possible information. If you have questions about this medicine, talk to your doctor, pharmacist, or health care provider.  2024 Elsevier/Gold Standard (2022-01-09 00:00:00)

## 2023-07-30 ENCOUNTER — Non-Acute Institutional Stay (SKILLED_NURSING_FACILITY): Payer: Medicare Other | Admitting: Internal Medicine

## 2023-07-30 ENCOUNTER — Encounter: Payer: Self-pay | Admitting: Internal Medicine

## 2023-07-30 ENCOUNTER — Ambulatory Visit: Payer: Medicare Other | Admitting: Internal Medicine

## 2023-07-30 DIAGNOSIS — R531 Weakness: Secondary | ICD-10-CM | POA: Diagnosis not present

## 2023-07-30 DIAGNOSIS — R509 Fever, unspecified: Secondary | ICD-10-CM

## 2023-07-30 DIAGNOSIS — D61818 Other pancytopenia: Secondary | ICD-10-CM

## 2023-07-30 DIAGNOSIS — R0602 Shortness of breath: Secondary | ICD-10-CM | POA: Diagnosis not present

## 2023-07-30 NOTE — Progress Notes (Unsigned)
Location:  [facility] Room Number: 153-A Place of Service:  SNF [phone]) Provider:  [name], MD   [name], MD  Patient Care Team: [name], MD as PCP - General (Internal Medicine) Shirlean Schlein, MD as Consulting Physician (Neurology) Eddie Candle, MD as Consulting Physician (Internal Medicine) York Spaniel, MD (Inactive) as Consulting Physician (Neurology) Sharlett Iles, DDS as Referring Physician (Oral Surgery) Ladene Artist, MD as Consulting Physician (Oncology) Fletcher Anon, NP as Nurse Practitioner (Nurse Practitioner)  Extended Emergency Contact Information Primary Emergency Contact: Sperbeck,[name] Address: [address]          [place], [place] [address] [name] of [place] Home Phone: [phone] Relation: Spouse  Code Status:  DNR Goals of care: Advanced Directive information    07/30/2023    9:42 AM  Advanced Directives  Does Patient Have a Medical Advance Directive? Yes  Type of Advance Directive Out of facility DNR (pink MOST or yellow form)  Does patient want to make changes to medical advance directive? No - Patient declined  Would patient like information on creating a medical advance directive? No - Patient declined  Pre-existing out of facility DNR order (yellow form or pink MOST form) Yellow form placed in chart (order not valid for inpatient use)     Chief Complaint  Patient presents with   Acute Visit    Patient is being seen for a low grade fever    HPI:  Pt is a 72 y.o. male seen today for an acute visit for Low grade fever and Weakness  Lives iN SNF in [place] now  Patient has h/o Multiple Myeloma diagnosed in 2012 Post CART therapy per Dr Marissa Calamity on 01/24  In Clinical Remission On IVIG monthly per Oncology [facility] due to CART and Chronic Microvascular changes On Ritalin Recurent falls due to Unstable Gait Anemia with Occult positive stools Pancytopenia  due to above  Patient lives in [place] Patient c/o feeling weak and Tired  patient also had an episode of both urine and stool incontinence which is unusual for him.   Later on his vitals showed a low-grade temp of 100.C  He was also put back on oxygen as he complained of some shortness of breath and had Wheezing Wife refused ED assessment Today patient is back to normal his vitals are stable.  He denies any weakness cough SOB or dysuria  Past Medical History:  Diagnosis Date   Anemia    Arthritis    Asthma    no treatment x 20 years   Depression    Double vision    occurs at times    Duodenal ulcer    GERD (gastroesophageal reflux disease)    Hemorrhage of rectum and anus 09/15/2013   Hyperlipidemia    Hypertension    Hypothyroidism    Leukopenia due to antineoplastic chemotherapy (HCC) 02/22/2017   Multiple myeloma 07/04/2011   Thyroid disease    TIA (transient ischemic attack) 03/27/2012   Transient diplopia 03/27/2012   Transient global amnesia 06/07/2015   Past Surgical History:  Procedure Laterality Date   BONE MARROW TRANSPLANT  2011   for MM   CARDIOLITE STUDY  11/25/2003   NORMAL   TONSILLECTOMY      Allergies  Allergen Reactions   Atorvastatin Other (See Comments)    (LIPITOR) "Allergic," per [facility]   Rosuvastatin Other (See Comments)    (CRESTOR) Liver Disorder (adverse effects) and "Allergic," per [facility]  Septra [Sulfamethoxazole-Trimethoprim] Rash and Other (See Comments)    "Allergic," per [facility]    Outpatient Encounter Medications as of 07/30/2023  Medication Sig   acetaminophen (TYLENOL) 500 MG tablet Take 2 tablets (1,000 mg total) by mouth every 8 (eight) hours.   CALCIUM 500 + D3 500-5 MG-MCG TABS Take 1 tablet by mouth in the morning.   calcium carbonate (TUMS EX) 750 MG chewable tablet Chew 2 tablets by mouth 3 (three) times daily as needed (for indigestion).   Cholecalciferol (VITAMIN D3) 25 MCG (1000 UT) capsule Take 1,000 Units by mouth daily.    cyanocobalamin (VITAMIN B12) 1000 MCG tablet Take 1 tablet (1,000 mcg total) by mouth daily.   dapsone 100 MG tablet Take 100 mg by mouth daily.   docusate sodium (COLACE) 100 MG capsule Take 100 mg by mouth daily as needed (for constipation).   escitalopram (LEXAPRO) 10 MG tablet Take 1 tablet (10 mg total) by mouth daily.   ferrous sulfate 325 (65 FE) MG EC tablet Take 325 mg by mouth 2 (two) times daily.   folic acid (FOLVITE) 1 MG tablet Take 1 tablet (1 mg total) by mouth daily.   levothyroxine (SYNTHROID) 125 MCG tablet Take 125 mcg by mouth daily before breakfast.   magic mouthwash (nystatin, lidocaine, diphenhydrAMINE, alum & mag hydroxide) suspension Swish and spit 5 mLs 4 (four) times daily for 7 days.   Menthol-Methyl Salicylate (MUSCLE RUB) 10-15 % CREA Apply 1 Application topically as needed for muscle pain.   methylphenidate (RITALIN) 5 MG tablet Take 0.5 tablets (2.5 mg total) by mouth every morning.   midodrine (PROAMATINE) 2.5 MG tablet Take 1 tablet (2.5 mg total) by mouth 2 (two) times daily with a meal.   ondansetron (ZOFRAN) 4 MG tablet Take 1 tablet (4 mg total) by mouth every 6 (six) hours as needed for nausea.   pantoprazole (PROTONIX) 40 MG tablet Take 1 tablet (40 mg total) by mouth daily.   Probiotic Product (UP4 PROBIOTICS WOMENS PO) Take 1 capsule by mouth daily.   tamsulosin (FLOMAX) 0.4 MG CAPS capsule Take 0.4 mg by mouth daily.   valACYclovir (VALTREX) 500 MG tablet Take 500 mg by mouth daily.   [DISCONTINUED] prochlorperazine (COMPAZINE) 10 MG tablet Take 1 tablet (10 mg total) by mouth every 6 (six) hours as needed (Nausea or vomiting).   No facility-administered encounter medications on file as of 07/30/2023.    Review of Systems  Constitutional:  Negative for activity change, appetite change and unexpected weight change.  HENT: Negative.    Respiratory:  Negative for cough and shortness of breath.   Cardiovascular:  Negative for leg swelling.   Gastrointestinal:  Negative for constipation.  Genitourinary:  Negative for frequency.  Musculoskeletal:  Negative for arthralgias, gait problem and myalgias.  Skin: Negative.  Negative for rash.  Neurological:  Positive for weakness. Negative for dizziness.  Psychiatric/Behavioral:  Negative for confusion and sleep disturbance.   All other systems reviewed and are negative.   Immunization History  Administered Date(s) Administered   DTaP / Hep B / IPV 05/04/2017   DTaP / HiB / IPV 03/06/2018   Fluad Quad(high Dose 65+) 06/09/2019, 06/25/2020, 07/25/2021, 05/30/2022   Fluad Trivalent(High Dose 65+) 08/04/2012, 07/03/2014, 06/16/2023   HIB (PRP-T) 05/04/2017   Hepatitis B, ADULT 03/14/2017, 03/06/2018   IPV 01/28/1998   Influenza, High Dose Seasonal PF 06/16/2018   Influenza,inj,Quad PF,6+ Mos 06/16/2013, 07/03/2014, 06/11/2015, 05/31/2017   Influenza-Unspecified 07/29/2012   PFIZER(Purple Top)SARS-COV-2 Vaccination 10/26/2019, 11/20/2019, 07/22/2020  [employer], [employer] 46yrs & up 06/27/2021   Pfizer(Comirnaty)Fall Seasonal Vaccine 12 years and older 07/05/2022   Pneumococcal Conjugate-13 10/15/2013, 05/04/2017, 03/06/2018   Pneumococcal Polysaccharide-23 06/16/2013   Respiratory Syncytial Virus Vaccine,Recomb Aduvanted(Arexvy) 05/30/2022   Td 02/10/1998, 07/03/2014   Pertinent  Health Maintenance Due  Topic Date Due   Colonoscopy  09/24/2023   INFLUENZA VACCINE  Completed      08/30/2022   11:01 AM 09/06/2022   10:00 AM 02/15/2023    1:29 PM 06/11/2023   11:20 AM 06/27/2023   10:37 AM  Fall Risk  Falls in the past year?   1 0 1  Was there an injury with Fall?   1 0 1  Fall Risk Category Calculator   3 0 3  (RETIRED) Patient Fall Risk Level Low fall risk Low fall risk     Patient at Risk for Falls Due to    History of fall(s) History of fall(s);Impaired balance/gait;Impaired mobility  Fall risk Follow up   Falls evaluation completed Falls evaluation  completed Falls evaluation completed;Education provided;Falls prevention discussed   Functional Status Survey:    Vitals:   07/30/23 0852  BP: (!) 150/75  Pulse: 66  Resp: 14  Temp: 99.1 F (37.3 C)  TempSrc: Temporal  SpO2: 92%  Weight: 177 lb (80.3 kg)  Height: 5\' 7"  (1.702 m)   Body mass index is 27.72 kg/m. Physical Exam Vitals reviewed.  Constitutional:      Appearance: Normal appearance.  HENT:     Head: Normocephalic.     Nose: Nose normal.     Mouth/Throat:     Mouth: Mucous membranes are moist.     Pharynx: Oropharynx is clear.  Eyes:     Pupils: Pupils are equal, round, and reactive to light.  Cardiovascular:     Rate and Rhythm: Normal rate and regular rhythm.     Pulses: Normal pulses.     Heart sounds: No murmur heard. Pulmonary:     Effort: Pulmonary effort is normal. No respiratory distress.     Breath sounds: Normal breath sounds. No rales.  Abdominal:     General: Abdomen is flat. Bowel sounds are normal.     Palpations: Abdomen is soft.  Musculoskeletal:        General: No swelling.     Cervical back: Neck supple.  Skin:    General: Skin is warm.  Neurological:     General: No focal deficit present.     Mental Status: He is alert and oriented to person, place, and time.  Psychiatric:        Mood and Affect: Mood normal.        Thought Content: Thought content normal.     Labs reviewed: Recent Labs    05/23/23 0413 06/06/23 0849 06/16/23 0536 06/17/23 0517 06/19/23 0514 06/20/23 0458 06/27/23 0933  NA 134*   < > 137   < > 134* 137 139  K 3.7   < > 3.9   < > 3.9 4.2 3.7  CL 101   < > 104   < > 106 105 105  CO2 25   < > 25   < > 22 24 25   GLUCOSE 97   < > 92   < > 89 86 114*  BUN 21   < > 17   < > 24* 21 16  CREATININE 0.91   < > 0.92   < > 0.85 0.89 0.93  CALCIUM 8.9   < >  8.8*   < > 9.0 9.2 9.9  MG 2.1  --  2.3  --   --  2.4 1.9  PHOS 2.4*  --  3.7  --   --   --  2.8   < > = values in this interval not displayed.   Recent  Labs    06/14/23 1246 06/16/23 0536 06/20/23 0458 06/27/23 0933  AST 27  --  29 22  ALT 24  --  21 15  ALKPHOS 69  --  83 108  BILITOT 0.6  --  0.4 0.5  PROT 5.6*  --  6.1* 6.8  ALBUMIN 3.0* 3.0* 3.2* 4.3   Recent Labs    07/13/23 0918 07/20/23 0908 07/27/23 0919  WBC 2.9* 2.9* 3.7*  NEUTROABS 1.5* 2.0 2.4  HGB 10.2* 9.9* 10.4*  HCT 30.7* 29.6* 31.6*  MCV 115.4* 116.5* 118.4*  PLT 25* 23* 26*   Lab Results  Component Value Date   TSH 0.173 (L) 05/20/2023   Lab Results  Component Value Date   HGBA1C 4.3 (L) 05/06/2023   Lab Results  Component Value Date   CHOL 178 02/13/2023   HDL 42 02/13/2023   LDLCALC 113 02/13/2023   LDLDIRECT 155.8 11/13/2012   TRIG 167 (A) 02/13/2023   CHOLHDL 4 09/06/2017    Significant Diagnostic Results in last 30 days:  No results found.  Assessment/Plan Weakness Low grade temp Now resolved ? Etiology He is back to baseline today Just had Romiplostim infusion on 11/08 Discussed with Nursing Will Continue to monitor No more work up right now  Pancytopenia (HCC) Bone mArrow Failure after CART therapy Transfusion as needed  Closed fracture of multiple ribs of left side with routine healing, subsequent encounter Due to Recurent Falls     Acquired hypothyroidism TSH was low in the hospital; Has follow up TSH pending Gait instability Works with therapy Number of falls due to his cognition and Neuropathy       Cognitive impairment Plan for him to now stay in SNF Due to Vascular Disease and CART therapy  Multiple myeloma, remission status unspecified (HCC) In Remission Per Oncolology BM done recently was negative for Recurrence On IVIG Per Oncology On Dapsone and Valtrex for Prophylaxis Depression On lexapro H/o Guaiac positive GI decide not to do Colonoscopy due to high risk with Low Platelets  Family/ staff Communication:   Labs/tests ordered:

## 2023-07-31 ENCOUNTER — Encounter: Payer: Self-pay | Admitting: Internal Medicine

## 2023-07-31 ENCOUNTER — Ambulatory Visit: Payer: Medicare Other

## 2023-08-01 ENCOUNTER — Telehealth: Payer: Self-pay | Admitting: *Deleted

## 2023-08-01 NOTE — Telephone Encounter (Signed)
[name] called to return missed call from office. Confirmed office did not call him. Informed him it most likely a reminder call for appointment tomorrow.

## 2023-08-02 ENCOUNTER — Encounter: Payer: Self-pay | Admitting: Nurse Practitioner

## 2023-08-02 ENCOUNTER — Ambulatory Visit: Payer: Medicare Other

## 2023-08-02 ENCOUNTER — Inpatient Hospital Stay: Payer: Medicare Other

## 2023-08-02 ENCOUNTER — Inpatient Hospital Stay: Payer: Medicare Other | Admitting: Nurse Practitioner

## 2023-08-02 VITALS — BP 150/69 | HR 69 | Temp 98.6°F | Resp 18

## 2023-08-02 VITALS — BP 111/62 | HR 93 | Temp 98.2°F | Resp 18 | Ht 67.0 in | Wt 182.0 lb

## 2023-08-02 DIAGNOSIS — D696 Thrombocytopenia, unspecified: Secondary | ICD-10-CM | POA: Diagnosis not present

## 2023-08-02 DIAGNOSIS — D801 Nonfamilial hypogammaglobulinemia: Secondary | ICD-10-CM | POA: Diagnosis not present

## 2023-08-02 DIAGNOSIS — C9000 Multiple myeloma not having achieved remission: Secondary | ICD-10-CM | POA: Diagnosis not present

## 2023-08-02 DIAGNOSIS — D649 Anemia, unspecified: Secondary | ICD-10-CM | POA: Diagnosis not present

## 2023-08-02 DIAGNOSIS — R6510 Systemic inflammatory response syndrome (SIRS) of non-infectious origin without acute organ dysfunction: Secondary | ICD-10-CM | POA: Diagnosis not present

## 2023-08-02 DIAGNOSIS — R509 Fever, unspecified: Secondary | ICD-10-CM | POA: Diagnosis not present

## 2023-08-02 LAB — CBC WITH DIFFERENTIAL (CANCER CENTER ONLY)
Abs Immature Granulocytes: 0.1 10*3/uL — ABNORMAL HIGH (ref 0.00–0.07)
Basophils Absolute: 0.1 10*3/uL (ref 0.0–0.1)
Basophils Relative: 4 %
Eosinophils Absolute: 0.1 10*3/uL (ref 0.0–0.5)
Eosinophils Relative: 2 %
HCT: 28.5 % — ABNORMAL LOW (ref 39.0–52.0)
Hemoglobin: 9.6 g/dL — ABNORMAL LOW (ref 13.0–17.0)
Lymphocytes Relative: 16 %
Lymphs Abs: 0.6 10*3/uL — ABNORMAL LOW (ref 0.7–4.0)
MCH: 39.2 pg — ABNORMAL HIGH (ref 26.0–34.0)
MCHC: 33.7 g/dL (ref 30.0–36.0)
MCV: 116.3 fL — ABNORMAL HIGH (ref 80.0–100.0)
Metamyelocytes Relative: 2 %
Monocytes Absolute: 0.2 10*3/uL (ref 0.1–1.0)
Monocytes Relative: 6 %
Neutro Abs: 2.5 10*3/uL (ref 1.7–7.7)
Neutrophils Relative %: 70 %
Platelet Count: 33 10*3/uL — ABNORMAL LOW (ref 150–400)
RBC: 2.45 MIL/uL — ABNORMAL LOW (ref 4.22–5.81)
RDW: 16.2 % — ABNORMAL HIGH (ref 11.5–15.5)
WBC Count: 3.6 10*3/uL — ABNORMAL LOW (ref 4.0–10.5)
nRBC: 0 % (ref 0.0–0.2)

## 2023-08-02 LAB — CMP (CANCER CENTER ONLY)
ALT: 14 U/L (ref 0–44)
AST: 16 U/L (ref 15–41)
Albumin: 4.2 g/dL (ref 3.5–5.0)
Alkaline Phosphatase: 75 U/L (ref 38–126)
Anion gap: 7 (ref 5–15)
BUN: 16 mg/dL (ref 8–23)
CO2: 27 mmol/L (ref 22–32)
Calcium: 9.4 mg/dL (ref 8.9–10.3)
Chloride: 103 mmol/L (ref 98–111)
Creatinine: 0.92 mg/dL (ref 0.61–1.24)
GFR, Estimated: >60 mL/min (ref >60)
Glucose, Bld: 102 mg/dL — ABNORMAL HIGH (ref 70–99)
Potassium: 4 mmol/L (ref 3.5–5.1)
Sodium: 137 mmol/L (ref 135–145)
Total Bilirubin: 0.4 mg/dL (ref <1.2)
Total Protein: 6.4 g/dL — ABNORMAL LOW (ref 6.5–8.1)

## 2023-08-02 LAB — SAMPLE TO BLOOD BANK

## 2023-08-02 MED ORDER — IMMUNE GLOBULIN (HUMAN) 10 GM/100ML IV SOLN
400.0000 mg/kg | Freq: Once | INTRAVENOUS | Status: AC
  Administered 2023-08-02: 30 g via INTRAVENOUS
  Filled 2023-08-02: qty 200

## 2023-08-02 MED ORDER — ACETAMINOPHEN 325 MG PO TABS
650.0000 mg | ORAL_TABLET | Freq: Once | ORAL | Status: AC
  Administered 2023-08-02: 650 mg via ORAL
  Filled 2023-08-02: qty 2

## 2023-08-02 MED ORDER — DEXTROSE 5 % IV SOLN
INTRAVENOUS | Status: DC
Start: 2023-08-02 — End: 2023-08-02

## 2023-08-02 MED ORDER — ROMIPLOSTIM INJECTION 500 MCG
6.0000 ug/kg | Freq: Once | SUBCUTANEOUS | Status: AC
  Administered 2023-08-02: 495 ug via SUBCUTANEOUS
  Filled 2023-08-02: qty 0.99

## 2023-08-02 MED ORDER — DIPHENHYDRAMINE HCL 25 MG PO CAPS
25.0000 mg | ORAL_CAPSULE | Freq: Once | ORAL | Status: AC
  Administered 2023-08-02: 25 mg via ORAL
  Filled 2023-08-02: qty 1

## 2023-08-02 NOTE — Progress Notes (Signed)
Patient seen by Lonna Cobb NP today  Vitals are within treatment parameters:Yes   Labs are within treatment parameters: No (Please specify and give further instructions.) Plts 33,000  Treatment plan has been signed: Yes   Per physician team, Patient is ready for treatment and there are NO modifications to the treatment plan.

## 2023-08-02 NOTE — Patient Instructions (Signed)
Immune Globulin Injection What is this medication? IMMUNE GLOBULIN (im MUNE  GLOB yoo lin) helps to prevent or reduce the severity of certain infections in patients who are at risk. This medicine is collected from the pooled blood of many donors. It is used to treat immune system problems, thrombocytopenia, and Kawasaki syndrome. This medicine may be used for other purposes; ask your health care provider or pharmacist if you have questions. This medicine may be used for other purposes; ask your health care provider or pharmacist if you have questions. COMMON BRAND NAME(S): ASCENIV, Baygam, BIVIGAM, Carimune, Carimune NF, cutaquig, Cuvitru, Flebogamma, Flebogamma DIF, GamaSTAN, GamaSTAN S/D, Gamimune N, Gammagard, Gammagard S/D, Gammaked, Gammaplex, Gammar-P IV, Gamunex, Gamunex-C, Hizentra, Iveegam, Iveegam EN, Octagam, Panglobulin, Panglobulin NF, panzyga, Polygam S/D, Privigen, Sandoglobulin, Venoglobulin-S, Vigam, Vivaglobulin, Xembify What should I tell my care team before I take this medication? They need to know if you have any of these conditions: diabetes extremely low or no immune antibodies in the blood heart disease history of blood clots hyperprolinemia infection in the blood, sepsis kidney disease recently received or scheduled to receive a vaccination an unusual or allergic reaction to human immune globulin, albumin, maltose, sucrose, other medicines, foods, dyes, or preservatives pregnant or trying to get pregnant breast-feeding How should I use this medication? This medicine is for injection into a muscle or infusion into a vein or skin. It is usually given by a health care professional in a hospital or clinic setting. In rare cases, some brands of this medicine might be given at home. You will be taught how to give this medicine. Use exactly as directed. Take your medicine at regular intervals. Do not take your medicine more often than directed. Talk to your pediatrician  regarding the use of this medicine in children. While this drug may be prescribed for selected conditions, precautions do apply. Overdosage: If you think you have taken too much of this medicine contact a poison control center or emergency room at once. NOTE: This medicine is only for you. Do not share this medicine with others. Overdosage: If you think you have taken too much of this medicine contact a poison control center or emergency room at once. NOTE: This medicine is only for you. Do not share this medicine with others. What if I miss a dose? It is important not to miss your dose. Call your doctor or health care professional if you are unable to keep an appointment. If you give yourself the medicine and you miss a dose, take it as soon as you can. If it is almost time for your next dose, take only that dose. Do not take double or extra doses. What may interact with this medication? aspirin and aspirin-like medicines cisplatin cyclosporine medicines for infection like acyclovir, adefovir, amphotericin B, bacitracin, cidofovir, foscarnet, ganciclovir, gentamicin, pentamidine, vancomycin NSAIDS, medicines for pain and inflammation, like ibuprofen or naproxen pamidronate vaccines zoledronic acid This list may not describe all possible interactions. Give your health care provider a list of all the medicines, herbs, non-prescription drugs, or dietary supplements you use. Also tell them if you smoke, drink alcohol, or use illegal drugs. Some items may interact with your medicine. This list may not describe all possible interactions. Give your health care provider a list of all the medicines, herbs, non-prescription drugs, or dietary supplements you use. Also tell them if you smoke, drink alcohol, or use illegal drugs. Some items may interact with your medicine. What should I watch for while using this medication?  Your condition will be monitored carefully while you are receiving this  medicine. This medicine is made from pooled blood donations of many different people. It may be possible to pass an infection in this medicine. However, the donors are screened for infections and all products are tested for HIV and hepatitis. The medicine is treated to kill most or all bacteria and viruses. Talk to your doctor about the risks and benefits of this medicine. Do not have vaccinations for at least 14 days before, or until at least 3 months after receiving this medicine. What side effects may I notice from receiving this medication? Side effects that you should report to your doctor or health care professional as soon as possible: allergic reactions like skin rash, itching or hives, swelling of the face, lips, or tongue blue colored lips or skin breathing problems chest pain or tightness fever signs and symptoms of aseptic meningitis such as stiff neck; sensitivity to light; headache; drowsiness; fever; nausea; vomiting; rash signs and symptoms of a blood clot such as chest pain; shortness of breath; pain, swelling, or warmth in the leg signs and symptoms of hemolytic anemia such as fast heartbeat; tiredness; dark yellow or brown urine; or yellowing of the eyes or skin signs and symptoms of kidney injury like trouble passing urine or change in the amount of urine sudden weight gain swelling of the ankles, feet, hands Side effects that usually do not require medical attention (report to your doctor or health care professional if they continue or are bothersome): diarrhea flushing headache increased sweating joint pain muscle cramps muscle pain nausea pain, redness, or irritation at site where injected tiredness This list may not describe all possible side effects. Call your doctor for medical advice about side effects. You may report side effects to FDA at 1-800-FDA-1088. This list may not describe all possible side effects. Call your doctor for medical advice about side  effects. You may report side effects to FDA at 1-800-FDA-1088. Where should I keep my medication? Keep out of the reach of children. This drug is usually given in a hospital or clinic and will not be stored at home. In rare cases, some brands of this medicine may be given at home. If you are using this medicine at home, you will be instructed on how to store this medicine. Throw away any unused medicine after the expiration date on the label. NOTE: This sheet is a summary. It may not cover all possible information. If you have questions about this medicine, talk to your doctor, pharmacist, or health care provider.  2024 Elsevier/Gold Standard (2019-04-09 00:00:00)  Romiplostim Injection What is this medication? ROMIPLOSTIM (roe mi PLOE stim) treats low levels of platelets in your body caused by immune thrombocytopenia (ITP). It is prescribed when other medications have not worked or cannot be tolerated. It may also be used to help people who have been exposed to high doses of radiation. It works by increasing the amount of platelets in your blood. This lowers the risk of bleeding. This medicine may be used for other purposes; ask your health care provider or pharmacist if you have questions. COMMON BRAND NAME(S): Nplate What should I tell my care team before I take this medication? They need to know if you have any of these conditions: Blood clots Myelodysplastic syndrome An unusual or allergic reaction to romiplostim, mannitol, other medications, foods, dyes, or preservatives Pregnant or trying to get pregnant Breast-feeding How should I use this medication? This medication is injected under  the skin. It is given by a care team in a hospital or clinic setting. A special MedGuide will be given to you before each treatment. Be sure to read this information carefully each time. Talk to your care team about the use of this medication in children. While it may be prescribed for children as young  as newborns for selected conditions, precautions do apply. Overdosage: If you think you have taken too much of this medicine contact a poison control center or emergency room at once. NOTE: This medicine is only for you. Do not share this medicine with others. What if I miss a dose? Keep appointments for follow-up doses. It is important not to miss your dose. Call your care team if you are unable to keep an appointment. What may interact with this medication? Interactions are not expected. This list may not describe all possible interactions. Give your health care provider a list of all the medicines, herbs, non-prescription drugs, or dietary supplements you use. Also tell them if you smoke, drink alcohol, or use illegal drugs. Some items may interact with your medicine. What should I watch for while using this medication? Visit your care team for regular checks on your progress. You may need blood work done while you are taking this medication. Your condition will be monitored carefully while you are receiving this medication. It is important not to miss any appointments. What side effects may I notice from receiving this medication? Side effects that you should report to your care team as soon as possible: Allergic reactions--skin rash, itching, hives, swelling of the face, lips, tongue, or throat Blood clot--pain, swelling, or warmth in the leg, shortness of breath, chest pain Side effects that usually do not require medical attention (report to your care team if they continue or are bothersome): Dizziness Joint pain Muscle pain Pain in the hands or feet Stomach pain Trouble sleeping This list may not describe all possible side effects. Call your doctor for medical advice about side effects. You may report side effects to FDA at 1-800-FDA-1088. Where should I keep my medication? This medication is given in a hospital or clinic. It will not be stored at home. NOTE: This sheet is a summary.  It may not cover all possible information. If you have questions about this medicine, talk to your doctor, pharmacist, or health care provider.  2024 Elsevier/Gold Standard (2022-01-09 00:00:00)

## 2023-08-02 NOTE — Progress Notes (Signed)
[facility] OFFICE PROGRESS NOTE   Diagnosis: Multiple myeloma  INTERVAL HISTORY:   [name] returns as scheduled.  He continues monthly IVIG.  He continues weekly Nplate.  He denies bleeding.  No signs of a reaction with the IVIG infusion.  He reports a good appetite.  No falls.  No nausea or vomiting.  He has an occasional loose stool.  Objective:  Vital signs in last 24 hours:  Blood pressure 111/62, pulse 93, temperature 98.2 F (36.8 C), temperature source Temporal, resp. rate 18, height 5\' 7"  (1.702 m), weight 182 lb (82.6 kg), SpO2 99%.    HEENT: No thrush.  No ulcers.  No bleeding in the mouth. Resp: Lungs clear bilaterally. Cardio: Regular rate and rhythm. GI: No hepatosplenomegaly. Vascular: Minimal lower leg edema bilaterally. Skin: Resolving ecchymosis right lower arm.   Lab Results:  Lab Results  Component Value Date   WBC 3.6 (L) 08/02/2023   HGB 9.6 (L) 08/02/2023   HCT 28.5 (L) 08/02/2023   MCV 116.3 (H) 08/02/2023   PLT 33 (L) 08/02/2023   NEUTROABS PENDING 08/02/2023    Imaging:  No results found.  Medications: I have reviewed the patient's current medications.  Assessment/Plan: Multiple myeloma- IgG lambda  bortezomib (subcutaneously), lenalidomide, and dexamethasone, with repeat bone marrow biopsy May of 2012 showing 10% plasmacytosis   (2) high-dose chemotherapy with BCNU and melphalan at [facility], followed by stem cell rescue July of 2012   (3) on zoledronic acid started December of 2012, initially monthly, currently given every 3 months, most recent dose  12/07/2015   (4) low-dose lenalidomide resumed April 2013, interrupted several times.  Resumed again on 02/19/2013, ata dose of 5 mg daily, 21 days on and 7 days off, later further reduced to 7 days on, 7 days off   (5) CNS symptoms and abnormal brain MRI extensively evaluated by neurology with no definitive diagnosis established   (6) rising M spike noted June 2014 but did  not meet criteria for carfilzomib study   (7) on lenalidomide 25 mg daily, 14 days on, 7 days off, starting 04/18/2013, interrupted December 2014 because of rash;             (a) resumed January 2015 at 10 mg/ day at 21 days on/ 7 days off             (b) starting 08/18/2014 decreased to 10 mg/ day 14 days on, 7 days off because of cytopenias             (c) as of February 2016 was on 5 mg daily 7 days on 7 days off             (d) lenalidomide discontinued December 2016 with evidence of disease progression   (8) transient global amnesia 05/29/2015, resolved without intervention   (9) starting PVD 10/25/2015 w ASA 325 thromboprophylaxis, valacyclovir prophylaxis, last dose 12/17/2015             (a) pomalidomide 4 mg/d days 1-14             (b) bortezomib sQ days 2,5,9,12 of each 21 day cycle             (c) dexamethasome 20 mg two days a week             (d) dexamethasone bortezomib and pomalidomide discontinued late December 2018 with poor tolerance   (10) metapneumovirus pneumonia April 2017             (  a) completing course of steroids and week of bactrim mid April 2017   (11) status post second autologous transplant at [facility] 02/04/2016(preparatory regimen melphalan 200 mg/m)             (a) received twelve-month vaccinations 03/14/2017 (DPT, Haemophilus, Pneumovax 13, polio)             (b) 14 months injections 05/04/2016 include DTaP, Hib conjugate, HepB energex B 20 mcg/ml, Prevnar 13             (c) 24 month vaccines due at [facility] June 2019   (12) maintenance therapy started November 2017, consisting of             (a) bortezomib 1.3 mg/M2 every 14 days, first dose 07/27/2016             (b) pomalidomide 1 mg days 1-21 Q28 days, started 07/19/2016             (c) zolendronate monthly started 07/27/2016 (previously Q 3 months) however patient unable to tolerate, and changed back to q3 months in April, 2018             (d) Bortezomib changed to monthly as of June 2018 because of  tolerance issues, however discontinued after 09/14/2017 dose because of a rise in his M spike             (e) pomalidomide held after 10/18/2017 in preparation for possible study at [facility] (venetoclax)--never resumed             (f) with numbers actually improved off treatment, resumed every 2-week bortezomib 12/25/2017             (g) changed to every 3-week bortezomib as of 03/17/2019             (h) briefly on weekly treatments times 22 October 2020 due to increase in M spike             (I) maintenance therapy discontinued with evidence of progression   (13) bortezomib/daratumumab/dexamethasone started 12/20/2020.             (a) Decadron dose dropped to 10 mg day of and day following treatment starting May 2022             (b) day 8 bortezomib omitted beginning with the June cycle per patient preference             (C) treatment changed to monthly daratumumab/Velcade/Decadron beginning 06/27/2021-patient decision (14) weekly Velcade/Cytoxan beginning 11/14/2021 (15) changed to Cytoxan/carfilzomib/Decadron 12/19/2021-treatment placed on hold 05/15/2022, carfilzomib 05/24/2022 and 05/31/2022 16.  Leukocyte a pheresis procedure at [facility] 06/15/2022 17.  Treatment resumed with Cytoxan/carfilzomib/Decadron 06/28/2022, last given 09/06/2022 18.  CAR-T 09/26/2022 with Carvykti 19.  CRS and ICANS following CAR-T therapy, status post a Decadron taper completed 10/08/2022 20.  Presentation to the emergency room 10/30/2022 with failure to thrive and a fall  21.  Pancytopenia secondary to multiple myeloma and CAR-T therapy-improved 22.  Bone marrow biopsy 11/13/2022-mildly hypocellular bone marrow with relative erythroid hyperplasia and decreased megakaryocytes; no plasma cells identified by differential count or CD138 immunohistochemical stain; flow cytometry negative for a clonal plasma cell population.   bone marrow biopsy 05/10/2023-mildly hypercellular marrow with mild decrease in megakaryocytes, plasma  cells not increased, absent iron stores, 46 XY, negative myeloma FISH panel, negative myeloid panel 23.  Cognitive impairment predating CAR-T therapy and worsened following CAR-T therapy 24.  Admission 05/05/2023 with Pseudomonas sepsis CTs chest, abdomen, and pelvis-right lung  base atelectasis, no infiltrate 25.  Admission 05/20/2023 with hypoxia, altered mental status, and fever Infectious disease evaluation on hospital admission consistent with a urinary tract infection, urine culture positive for Pseudomonas aeruginosa 26.  Hypogammaglobulinemia secondary to multiple myeloma and CAR-T therapy Monthly IVIG 27.  Admission 06/14/2023 with severe anemia, hypotension, and report of a positive blood culture from 06/13/2023?  With gram-positive cocci in clusters, recent fall with a plain x-ray 06/14/2023 confirming left sixth and seventh rib fractures 28.  Thrombocytopenia-possibly bone marrow failure from extensive course of treatment, possible treatment related MDS, immune thrombocytopenia, atypical infection 06/20/2023 CMV and parvovirus negative  weekly Nplate beginning 06/20/2023 Nplate dose escalated per protocol 07/06/2023    Disposition: [name] appears stable.  We reviewed the CBC.  Hemoglobin and white count stable.  Platelet count is a little higher.  Plan to continue weekly Nplate.  He has hypogammaglobulinemia secondary to multiple myeloma and CAR-T therapy.  Plan to continue monthly IVIG, infusion today.  He will continue weekly labs/Nplate.  We will see him in follow-up in 4 weeks.  He will contact the office in the interim with any problems.  We specifically discussed fever, signs of infection, bleeding.    Lonna Cobb ANP/GNP-BC   08/02/2023  9:05 AM

## 2023-08-04 ENCOUNTER — Encounter (HOSPITAL_COMMUNITY): Payer: Self-pay | Admitting: Internal Medicine

## 2023-08-04 ENCOUNTER — Emergency Department (HOSPITAL_COMMUNITY): Payer: Medicare Other

## 2023-08-04 ENCOUNTER — Other Ambulatory Visit: Payer: Self-pay

## 2023-08-04 ENCOUNTER — Inpatient Hospital Stay (HOSPITAL_COMMUNITY)
Admission: EM | Admit: 2023-08-04 | Discharge: 2023-08-07 | DRG: 864 | Disposition: A | Discharge status: Skilled Nursing Facility | Payer: Medicare Other | Source: Skilled Nursing Facility | Attending: Family Medicine | Admitting: Family Medicine

## 2023-08-04 DIAGNOSIS — E039 Hypothyroidism, unspecified: Secondary | ICD-10-CM | POA: Diagnosis present

## 2023-08-04 DIAGNOSIS — E871 Hypo-osmolality and hyponatremia: Secondary | ICD-10-CM | POA: Diagnosis present

## 2023-08-04 DIAGNOSIS — D61818 Other pancytopenia: Secondary | ICD-10-CM | POA: Diagnosis present

## 2023-08-04 DIAGNOSIS — Z79890 Hormone replacement therapy (postmenopausal): Secondary | ICD-10-CM

## 2023-08-04 DIAGNOSIS — Z823 Family history of stroke: Secondary | ICD-10-CM

## 2023-08-04 DIAGNOSIS — Z1152 Encounter for screening for COVID-19: Secondary | ICD-10-CM

## 2023-08-04 DIAGNOSIS — R413 Other amnesia: Secondary | ICD-10-CM | POA: Diagnosis present

## 2023-08-04 DIAGNOSIS — E785 Hyperlipidemia, unspecified: Secondary | ICD-10-CM | POA: Diagnosis present

## 2023-08-04 DIAGNOSIS — Z9481 Bone marrow transplant status: Secondary | ICD-10-CM | POA: Diagnosis not present

## 2023-08-04 DIAGNOSIS — Z87891 Personal history of nicotine dependence: Secondary | ICD-10-CM | POA: Diagnosis not present

## 2023-08-04 DIAGNOSIS — T451X5A Adverse effect of antineoplastic and immunosuppressive drugs, initial encounter: Secondary | ICD-10-CM | POA: Diagnosis present

## 2023-08-04 DIAGNOSIS — G3184 Mild cognitive impairment, so stated: Secondary | ICD-10-CM | POA: Diagnosis present

## 2023-08-04 DIAGNOSIS — I959 Hypotension, unspecified: Secondary | ICD-10-CM | POA: Diagnosis present

## 2023-08-04 DIAGNOSIS — I10 Essential (primary) hypertension: Secondary | ICD-10-CM | POA: Diagnosis present

## 2023-08-04 DIAGNOSIS — K219 Gastro-esophageal reflux disease without esophagitis: Secondary | ICD-10-CM | POA: Diagnosis present

## 2023-08-04 DIAGNOSIS — Z66 Do not resuscitate: Secondary | ICD-10-CM | POA: Diagnosis present

## 2023-08-04 DIAGNOSIS — Z808 Family history of malignant neoplasm of other organs or systems: Secondary | ICD-10-CM

## 2023-08-04 DIAGNOSIS — Z79899 Other long term (current) drug therapy: Secondary | ICD-10-CM | POA: Diagnosis not present

## 2023-08-04 DIAGNOSIS — D696 Thrombocytopenia, unspecified: Secondary | ICD-10-CM | POA: Diagnosis present

## 2023-08-04 DIAGNOSIS — D849 Immunodeficiency, unspecified: Secondary | ICD-10-CM | POA: Diagnosis present

## 2023-08-04 DIAGNOSIS — Z800 Family history of malignant neoplasm of digestive organs: Secondary | ICD-10-CM

## 2023-08-04 DIAGNOSIS — D539 Nutritional anemia, unspecified: Secondary | ICD-10-CM | POA: Diagnosis present

## 2023-08-04 DIAGNOSIS — R509 Fever, unspecified: Principal | ICD-10-CM | POA: Diagnosis present

## 2023-08-04 DIAGNOSIS — R6510 Systemic inflammatory response syndrome (SIRS) of non-infectious origin without acute organ dysfunction: Secondary | ICD-10-CM | POA: Diagnosis present

## 2023-08-04 DIAGNOSIS — J45909 Unspecified asthma, uncomplicated: Secondary | ICD-10-CM | POA: Diagnosis present

## 2023-08-04 DIAGNOSIS — R6884 Jaw pain: Secondary | ICD-10-CM | POA: Diagnosis present

## 2023-08-04 DIAGNOSIS — Z825 Family history of asthma and other chronic lower respiratory diseases: Secondary | ICD-10-CM

## 2023-08-04 DIAGNOSIS — Z9221 Personal history of antineoplastic chemotherapy: Secondary | ICD-10-CM | POA: Diagnosis not present

## 2023-08-04 DIAGNOSIS — Z833 Family history of diabetes mellitus: Secondary | ICD-10-CM

## 2023-08-04 DIAGNOSIS — Z8673 Personal history of transient ischemic attack (TIA), and cerebral infarction without residual deficits: Secondary | ICD-10-CM

## 2023-08-04 DIAGNOSIS — D701 Agranulocytosis secondary to cancer chemotherapy: Secondary | ICD-10-CM | POA: Diagnosis present

## 2023-08-04 DIAGNOSIS — Z888 Allergy status to other drugs, medicaments and biological substances status: Secondary | ICD-10-CM

## 2023-08-04 DIAGNOSIS — Z882 Allergy status to sulfonamides status: Secondary | ICD-10-CM

## 2023-08-04 DIAGNOSIS — G9341 Metabolic encephalopathy: Secondary | ICD-10-CM | POA: Diagnosis present

## 2023-08-04 DIAGNOSIS — Z91014 Allergy to mammalian meats: Secondary | ICD-10-CM

## 2023-08-04 DIAGNOSIS — Z8711 Personal history of peptic ulcer disease: Secondary | ICD-10-CM

## 2023-08-04 DIAGNOSIS — G4730 Sleep apnea, unspecified: Secondary | ICD-10-CM | POA: Diagnosis present

## 2023-08-04 DIAGNOSIS — C9000 Multiple myeloma not having achieved remission: Secondary | ICD-10-CM | POA: Diagnosis present

## 2023-08-04 DIAGNOSIS — Z8249 Family history of ischemic heart disease and other diseases of the circulatory system: Secondary | ICD-10-CM

## 2023-08-04 DIAGNOSIS — R5381 Other malaise: Secondary | ICD-10-CM | POA: Diagnosis present

## 2023-08-04 DIAGNOSIS — Z8579 Personal history of other malignant neoplasms of lymphoid, hematopoietic and related tissues: Secondary | ICD-10-CM

## 2023-08-04 LAB — URINALYSIS, W/ REFLEX TO CULTURE (INFECTION SUSPECTED)
Bilirubin Urine: NEGATIVE
Glucose, UA: NEGATIVE mg/dL
Ketones, ur: NEGATIVE mg/dL
Leukocytes,Ua: NEGATIVE
Nitrite: NEGATIVE
Protein, ur: NEGATIVE mg/dL
Specific Gravity, Urine: 1.014 (ref 1.005–1.030)
pH: 5 (ref 5.0–8.0)

## 2023-08-04 LAB — COMPREHENSIVE METABOLIC PANEL
ALT: 18 U/L (ref 0–44)
AST: 24 U/L (ref 15–41)
Albumin: 3.4 g/dL — ABNORMAL LOW (ref 3.5–5.0)
Alkaline Phosphatase: 74 U/L (ref 38–126)
Anion gap: 8 (ref 5–15)
BUN: 14 mg/dL (ref 8–23)
CO2: 21 mmol/L — ABNORMAL LOW (ref 22–32)
Calcium: 8.5 mg/dL — ABNORMAL LOW (ref 8.9–10.3)
Chloride: 100 mmol/L (ref 98–111)
Creatinine, Ser: 0.78 mg/dL (ref 0.61–1.24)
GFR, Estimated: >60 mL/min (ref >60)
Glucose, Bld: 95 mg/dL (ref 70–99)
Potassium: 3.8 mmol/L (ref 3.5–5.1)
Sodium: 129 mmol/L — ABNORMAL LOW (ref 135–145)
Total Bilirubin: 0.6 mg/dL (ref <1.2)
Total Protein: 6.9 g/dL (ref 6.5–8.1)

## 2023-08-04 LAB — CBC WITH DIFFERENTIAL/PLATELET
Abs Immature Granulocytes: 0.24 10*3/uL — ABNORMAL HIGH (ref 0.00–0.07)
Basophils Absolute: 0 10*3/uL (ref 0.0–0.1)
Basophils Relative: 1 %
Eosinophils Absolute: 0 10*3/uL (ref 0.0–0.5)
Eosinophils Relative: 1 %
HCT: 28 % — ABNORMAL LOW (ref 39.0–52.0)
Hemoglobin: 9.1 g/dL — ABNORMAL LOW (ref 13.0–17.0)
Immature Granulocytes: 5 %
Lymphocytes Relative: 5 %
Lymphs Abs: 0.3 10*3/uL — ABNORMAL LOW (ref 0.7–4.0)
MCH: 38.7 pg — ABNORMAL HIGH (ref 26.0–34.0)
MCHC: 32.5 g/dL (ref 30.0–36.0)
MCV: 119.1 fL — ABNORMAL HIGH (ref 80.0–100.0)
Monocytes Absolute: 0.8 10*3/uL (ref 0.1–1.0)
Monocytes Relative: 17 %
Neutro Abs: 3.3 10*3/uL (ref 1.7–7.7)
Neutrophils Relative %: 71 %
Platelets: 27 10*3/uL — CL (ref 150–400)
RBC: 2.35 MIL/uL — ABNORMAL LOW (ref 4.22–5.81)
RDW: 15.8 % — ABNORMAL HIGH (ref 11.5–15.5)
WBC: 4.6 10*3/uL (ref 4.0–10.5)
nRBC: 0 % (ref 0.0–0.2)

## 2023-08-04 LAB — RESP PANEL BY RT-PCR (RSV, FLU A&B, COVID)  RVPGX2
Influenza A by PCR: NEGATIVE
Influenza B by PCR: NEGATIVE
Resp Syncytial Virus by PCR: NEGATIVE
SARS Coronavirus 2 by RT PCR: NEGATIVE

## 2023-08-04 LAB — PROTIME-INR
INR: 1 (ref 0.8–1.2)
Prothrombin Time: 13.4 s (ref 11.4–15.2)

## 2023-08-04 LAB — I-STAT CG4 LACTIC ACID, ED: Lactic Acid, Venous: 0.8 mmol/L (ref 0.5–1.9)

## 2023-08-04 LAB — MRSA NEXT GEN BY PCR, NASAL: MRSA by PCR Next Gen: NOT DETECTED

## 2023-08-04 LAB — APTT: aPTT: 29 s (ref 24–36)

## 2023-08-04 MED ORDER — METRONIDAZOLE 500 MG/100ML IV SOLN
500.0000 mg | Freq: Once | INTRAVENOUS | Status: AC
  Administered 2023-08-04: 500 mg via INTRAVENOUS
  Filled 2023-08-04: qty 100

## 2023-08-04 MED ORDER — SODIUM CHLORIDE 0.9 % IV SOLN
2.0000 g | Freq: Once | INTRAVENOUS | Status: AC
  Administered 2023-08-04: 2 g via INTRAVENOUS
  Filled 2023-08-04: qty 12.5

## 2023-08-04 MED ORDER — MUSCLE RUB 10-15 % EX CREA: 1.0000 | TOPICAL_CREAM | CUTANEOUS | Status: DC | PRN

## 2023-08-04 MED ORDER — DAPSONE 100 MG PO TABS
100.0000 mg | ORAL_TABLET | Freq: Every day | ORAL | Status: DC
  Administered 2023-08-05 – 2023-08-07 (×3): 100 mg via ORAL
  Filled 2023-08-04 (×3): qty 1

## 2023-08-04 MED ORDER — METRONIDAZOLE 500 MG/100ML IV SOLN
500.0000 mg | Freq: Two times a day (BID) | INTRAVENOUS | Status: DC
  Administered 2023-08-05 – 2023-08-06 (×3): 500 mg via INTRAVENOUS
  Filled 2023-08-04 (×3): qty 100

## 2023-08-04 MED ORDER — MIDODRINE HCL 5 MG PO TABS
2.5000 mg | ORAL_TABLET | Freq: Two times a day (BID) | ORAL | Status: DC
  Administered 2023-08-05: 2.5 mg via ORAL
  Filled 2023-08-04: qty 1

## 2023-08-04 MED ORDER — ONDANSETRON HCL 4 MG PO TABS: 4.0000 mg | ORAL_TABLET | Freq: Four times a day (QID) | ORAL | Status: DC | PRN

## 2023-08-04 MED ORDER — ONDANSETRON HCL 4 MG/2ML IJ SOLN: 4.0000 mg | Freq: Four times a day (QID) | INTRAMUSCULAR | Status: DC | PRN

## 2023-08-04 MED ORDER — SODIUM CHLORIDE 0.9% FLUSH
10.0000 mL | Freq: Two times a day (BID) | INTRAVENOUS | Status: DC
  Administered 2023-08-04 – 2023-08-07 (×7): 10 mL via INTRAVENOUS

## 2023-08-04 MED ORDER — SODIUM CHLORIDE 0.9 % IV SOLN
2.0000 g | Freq: Three times a day (TID) | INTRAVENOUS | Status: DC
  Administered 2023-08-04 – 2023-08-06 (×5): 2 g via INTRAVENOUS
  Filled 2023-08-04 (×5): qty 12.5

## 2023-08-04 MED ORDER — VANCOMYCIN HCL IN DEXTROSE 1-5 GM/200ML-% IV SOLN: 1000.0000 mg | Freq: Once | INTRAVENOUS | Status: DC

## 2023-08-04 MED ORDER — VITAMIN B-12 1000 MCG PO TABS
1000.0000 ug | ORAL_TABLET | Freq: Every day | ORAL | Status: DC
  Administered 2023-08-05 – 2023-08-07 (×3): 1000 ug via ORAL
  Filled 2023-08-04 (×3): qty 1

## 2023-08-04 MED ORDER — FOLIC ACID 1 MG PO TABS
1.0000 mg | ORAL_TABLET | Freq: Every day | ORAL | Status: DC
  Administered 2023-08-05 – 2023-08-07 (×3): 1 mg via ORAL
  Filled 2023-08-04 (×3): qty 1

## 2023-08-04 MED ORDER — TAMSULOSIN HCL 0.4 MG PO CAPS
0.4000 mg | ORAL_CAPSULE | Freq: Every day | ORAL | Status: DC
  Administered 2023-08-05 – 2023-08-07 (×3): 0.4 mg via ORAL
  Filled 2023-08-04 (×3): qty 1

## 2023-08-04 MED ORDER — ACETAMINOPHEN 325 MG PO TABS
650.0000 mg | ORAL_TABLET | Freq: Four times a day (QID) | ORAL | Status: DC | PRN
  Administered 2023-08-04 – 2023-08-07 (×6): 650 mg via ORAL
  Filled 2023-08-04 (×6): qty 2

## 2023-08-04 MED ORDER — PANTOPRAZOLE SODIUM 40 MG PO TBEC
40.0000 mg | DELAYED_RELEASE_TABLET | Freq: Every day | ORAL | Status: DC
  Administered 2023-08-05 – 2023-08-07 (×3): 40 mg via ORAL
  Filled 2023-08-04 (×3): qty 1

## 2023-08-04 MED ORDER — LEVOTHYROXINE SODIUM 25 MCG PO TABS
125.0000 ug | ORAL_TABLET | Freq: Every day | ORAL | Status: DC
  Administered 2023-08-05 – 2023-08-07 (×3): 125 ug via ORAL
  Filled 2023-08-04 (×3): qty 1

## 2023-08-04 MED ORDER — CALCIUM CARBONATE ANTACID 500 MG PO CHEW: 2.0000 | CHEWABLE_TABLET | Freq: Three times a day (TID) | ORAL | Status: DC | PRN

## 2023-08-04 MED ORDER — VALACYCLOVIR HCL 500 MG PO TABS
500.0000 mg | ORAL_TABLET | Freq: Every day | ORAL | Status: DC
  Administered 2023-08-05 – 2023-08-07 (×3): 500 mg via ORAL
  Filled 2023-08-04 (×3): qty 1

## 2023-08-04 MED ORDER — ESCITALOPRAM OXALATE 10 MG PO TABS
10.0000 mg | ORAL_TABLET | Freq: Every day | ORAL | Status: DC
  Administered 2023-08-05 – 2023-08-07 (×3): 10 mg via ORAL
  Filled 2023-08-04 (×3): qty 1

## 2023-08-04 MED ORDER — ACETAMINOPHEN 650 MG RE SUPP: 650.0000 mg | Freq: Four times a day (QID) | RECTAL | Status: DC | PRN

## 2023-08-04 MED ORDER — ACETAMINOPHEN 500 MG PO TABS: 1000.0000 mg | ORAL_TABLET | Freq: Once | ORAL | Status: DC

## 2023-08-04 MED ORDER — VANCOMYCIN HCL IN DEXTROSE 1-5 GM/200ML-% IV SOLN
1000.0000 mg | Freq: Two times a day (BID) | INTRAVENOUS | Status: DC
  Administered 2023-08-04 – 2023-08-05 (×2): 1000 mg via INTRAVENOUS
  Filled 2023-08-04 (×2): qty 200

## 2023-08-04 MED ORDER — METHYLPHENIDATE HCL 5 MG PO TABS
2.5000 mg | ORAL_TABLET | Freq: Every morning | ORAL | Status: DC
  Administered 2023-08-05 – 2023-08-07 (×3): 2.5 mg via ORAL
  Filled 2023-08-04 (×3): qty 1

## 2023-08-04 MED ORDER — ORAL CARE MOUTH RINSE: 15.0000 mL | OROMUCOSAL | Status: DC | PRN

## 2023-08-04 MED ORDER — LACTATED RINGERS IV BOLUS (SEPSIS)
1000.0000 mL | Freq: Once | INTRAVENOUS | Status: AC
  Administered 2023-08-04: 1000 mL via INTRAVENOUS

## 2023-08-04 MED ORDER — ACETAMINOPHEN 325 MG PO TABS
325.0000 mg | ORAL_TABLET | Freq: Once | ORAL | Status: AC
  Administered 2023-08-04: 325 mg via ORAL
  Filled 2023-08-04: qty 1

## 2023-08-04 MED ORDER — FERROUS SULFATE 325 (65 FE) MG PO TABS
325.0000 mg | ORAL_TABLET | Freq: Two times a day (BID) | ORAL | Status: DC
  Administered 2023-08-05 – 2023-08-07 (×5): 325 mg via ORAL
  Filled 2023-08-04 (×5): qty 1

## 2023-08-04 NOTE — ED Notes (Signed)
ED TO INPATIENT HANDOFF REPORT  ED Nurse Name and Phone #: Reita Cliche RN  S Name/Age/Gender Drue Second 72 y.o. male Room/Bed: RESA/RESA  Code Status   Code Status: Limited: Do not attempt resuscitation (DNR) -DNR-LIMITED -Do Not Intubate/DNI   Home/SNF/Other Home Patient oriented to: self, place, time, and situation Is this baseline? Yes   Triage Complete: Triage complete  Chief Complaint SIRS (systemic inflammatory response syndrome) (HCC) [R65.10]  Triage Note From [facility] multiple myeloma. Today reported fever. Ao x 4. O2 93% on 3L.   T101.7 RR 26 HR 114 148/90    Allergies Allergies  Allergen Reactions   Atorvastatin Other (See Comments)    (LIPITOR) "Allergic," per MAR   Rosuvastatin Other (See Comments)    (CRESTOR) Liver Disorder (adverse effects) and "Allergic," per MAR   Septra [Sulfamethoxazole-Trimethoprim] Rash and Other (See Comments)    "Allergic," per MAR    Level of Care/Admitting Diagnosis ED Disposition     ED Disposition  Admit   Condition  --   Comment  Hospital Area: [facility] [[id number]]  Level of Care: Progressive [102]  Admit to Progressive based on following criteria: MULTISYSTEM THREATS such as stable sepsis, metabolic/electrolyte imbalance with or without encephalopathy that is responding to early treatment.  May admit patient to [facility] or [name] if equivalent level of care is available:: No  Covid Evaluation: Asymptomatic - no recent exposure (last 10 days) testing not required  Diagnosis: SIRS (systemic inflammatory response syndrome) [facility]) [[id number]]  Admitting Physician: Bobette Mo [[id number]]  Attending Physician: Bobette Mo [[id number]]  Certification:: I certify this patient will need inpatient services for at least 2 midnights  Expected Medical Readiness: 08/06/2023          B Medical/Surgery History Past Medical History:  Diagnosis Date   Anemia    Arthritis     Asthma    no treatment x 20 years   Depression    Double vision    occurs at times    Duodenal ulcer    GERD (gastroesophageal reflux disease)    Hemorrhage of rectum and anus 09/15/2013   Hyperlipidemia    Hypertension    Hypothyroidism    Leukopenia due to antineoplastic chemotherapy (HCC) 02/22/2017   Multiple myeloma 07/04/2011   Thyroid disease    TIA (transient ischemic attack) 03/27/2012   Transient diplopia 03/27/2012   Transient global amnesia 06/07/2015   Past Surgical History:  Procedure Laterality Date   BONE MARROW TRANSPLANT  2011   for MM   CARDIOLITE STUDY  11/25/2003   NORMAL   TONSILLECTOMY       A IV Location/Drains/Wounds Patient Lines/Drains/Airways Status     Active Line/Drains/Airways     Name Placement date Placement time Site Days   Peripheral IV Posterior;Right Hand --  --  Hand  --   Peripheral IV 08/04/23 20 G 1" Anterior;Left Forearm 08/04/23  1120  Forearm  less than 1            Intake/Output Last 24 hours  Intake/Output Summary (Last 24 hours) at 08/04/2023 1517 Last data filed at 08/04/2023 1513 Gross per 24 hour  Intake 1210.05 ml  Output --  Net 1210.05 ml    Labs/Imaging Results for orders placed or performed during the hospital encounter of 08/04/23 (from the past 48 hour(s))  Comprehensive metabolic panel     Status: Abnormal   Collection Time: 08/04/23 11:15 AM  Result Value Ref Range   Sodium  129 (L) 135 - 145 mmol/L   Potassium 3.8 3.5 - 5.1 mmol/L   Chloride 100 98 - 111 mmol/L   CO2 21 (L) 22 - 32 mmol/L   Glucose, Bld 95 70 - 99 mg/dL    Comment: Glucose reference range applies only to samples taken after fasting for at least 8 hours.   BUN 14 8 - 23 mg/dL   Creatinine, Ser 3.24 0.61 - 1.24 mg/dL   Calcium 8.5 (L) 8.9 - 10.3 mg/dL   Total Protein 6.9 6.5 - 8.1 g/dL   Albumin 3.4 (L) 3.5 - 5.0 g/dL   AST 24 15 - 41 U/L   ALT 18 0 - 44 U/L   Alkaline Phosphatase 74 38 - 126 U/L   Total Bilirubin 0.6 <1.2  mg/dL   GFR, Estimated >40 >10 mL/min    Comment: (NOTE) Calculated using the CKD-EPI Creatinine Equation (2021)    Anion gap 8 5 - 15    Comment: Performed at [facility], [address], [place], [place] [address]  CBC with Differential     Status: Abnormal   Collection Time: 08/04/23 11:15 AM  Result Value Ref Range   WBC 4.6 4.0 - 10.5 K/uL   RBC 2.35 (L) 4.22 - 5.81 MIL/uL   Hemoglobin 9.1 (L) 13.0 - 17.0 g/dL   HCT 66.4 (L) 40.3 - 47.4 %   MCV 119.1 (H) 80.0 - 100.0 fL   MCH 38.7 (H) 26.0 - 34.0 pg   MCHC 32.5 30.0 - 36.0 g/dL   RDW 25.9 (H) 56.3 - 87.5 %   Platelets 27 (LL) 150 - 400 K/uL    Comment: SPECIMEN CHECKED FOR CLOTS Immature Platelet Fraction may be clinically indicated, consider ordering this additional test [id number] REPEATED TO VERIFY PLATELET COUNT CONFIRMED BY SMEAR    nRBC 0.0 0.0 - 0.2 %   Neutrophils Relative % 71 %   Neutro Abs 3.3 1.7 - 7.7 K/uL   Lymphocytes Relative 5 %   Lymphs Abs 0.3 (L) 0.7 - 4.0 K/uL   Monocytes Relative 17 %   Monocytes Absolute 0.8 0.1 - 1.0 K/uL   Eosinophils Relative 1 %   Eosinophils Absolute 0.0 0.0 - 0.5 K/uL   Basophils Relative 1 %   Basophils Absolute 0.0 0.0 - 0.1 K/uL   Immature Granulocytes 5 %   Abs Immature Granulocytes 0.24 (H) 0.00 - 0.07 K/uL   Polychromasia PRESENT    Ovalocytes PRESENT     Comment: Performed at [facility], [address], [place], [place] [address]  Protime-INR     Status: None   Collection Time: 08/04/23 11:15 AM  Result Value Ref Range   Prothrombin Time 13.4 11.4 - 15.2 seconds   INR 1.0 0.8 - 1.2    Comment: (NOTE) INR goal varies based on device and disease states. Performed at [facility], [address], [place], [place] [address]   APTT     Status: None   Collection Time: 08/04/23 11:15 AM  Result Value Ref Range   aPTT 29 24 - 36 seconds    Comment: Performed at [facility], [address] [place], [place] [address]  I-Stat Lactic Acid, ED     Status: None   Collection Time: 08/04/23 11:22 AM  Result Value Ref Range   Lactic Acid, Venous 0.8 0.5 - 1.9 mmol/L  Resp panel by RT-PCR (RSV, Flu A&B, Covid) Anterior Nasal Swab  Status: None   Collection Time: 08/04/23 12:07 PM   Specimen: Anterior Nasal Swab  Result Value Ref Range   SARS Coronavirus 2 by RT PCR NEGATIVE NEGATIVE    Comment: (NOTE) SARS-CoV-2 target nucleic acids are NOT DETECTED.  The SARS-CoV-2 RNA is generally detectable in upper respiratory specimens during the acute phase of infection. The lowest concentration of SARS-CoV-2 viral copies this assay can detect is 138 copies/mL. A negative result does not preclude SARS-Cov-2 infection and should not be used as the sole basis for treatment or other patient management decisions. A negative result may occur with  improper specimen collection/handling, submission of specimen other than nasopharyngeal swab, presence of viral mutation(s) within the areas targeted by this assay, and inadequate number of viral copies(<138 copies/mL). A negative result must be combined with clinical observations, patient history, and epidemiological information. The expected result is Negative.  Fact Sheet for Patients:  BloggerCourse.com  Fact Sheet for Healthcare Providers:  SeriousBroker.it  This test is no t yet approved or cleared by the [place] FDA and  has been authorized for detection and/or diagnosis of SARS-CoV-2 by FDA under an Emergency Use Authorization (EUA). This EUA will remain  in effect (meaning this test can be used) for the duration of the COVID-19 declaration under Section 564(b)(1) of the Act, 21 U.S.C.section 360bbb-3(b)(1), unless the authorization is terminated  or revoked sooner.       Influenza A by PCR NEGATIVE NEGATIVE   Influenza B by PCR NEGATIVE NEGATIVE    Comment:  (NOTE) The Xpert Xpress SARS-CoV-2/FLU/RSV plus assay is intended as an aid in the diagnosis of influenza from Nasopharyngeal swab specimens and should not be used as a sole basis for treatment. Nasal washings and aspirates are unacceptable for Xpert Xpress SARS-CoV-2/FLU/RSV testing.  Fact Sheet for Patients: BloggerCourse.com  Fact Sheet for Healthcare Providers: SeriousBroker.it  This test is not yet approved or cleared by the [place] FDA and has been authorized for detection and/or diagnosis of SARS-CoV-2 by FDA under an Emergency Use Authorization (EUA). This EUA will remain in effect (meaning this test can be used) for the duration of the COVID-19 declaration under Section 564(b)(1) of the Act, 21 U.S.C. section 360bbb-3(b)(1), unless the authorization is terminated or revoked.     Resp Syncytial Virus by PCR NEGATIVE NEGATIVE    Comment: (NOTE) Fact Sheet for Patients: BloggerCourse.com  Fact Sheet for Healthcare Providers: SeriousBroker.it  This test is not yet approved or cleared by the [place] FDA and has been authorized for detection and/or diagnosis of SARS-CoV-2 by FDA under an Emergency Use Authorization (EUA). This EUA will remain in effect (meaning this test can be used) for the duration of the COVID-19 declaration under Section 564(b)(1) of the Act, 21 U.S.C. section 360bbb-3(b)(1), unless the authorization is terminated or revoked.  Performed at [facility], [address] [place], [place] [address]   Urinalysis, w/ Reflex to Culture (Infection Suspected) -Urine, Clean Catch     Status: Abnormal   Collection Time: 08/04/23 12:38 PM  Result Value Ref Range   Specimen Source URINE, CLEAN CATCH    Color, Urine YELLOW YELLOW   APPearance CLEAR CLEAR   Specific Gravity, Urine 1.014 1.005 - 1.030   pH 5.0 5.0 - 8.0   Glucose,  UA NEGATIVE NEGATIVE mg/dL   Hgb urine dipstick SMALL (A) NEGATIVE   Bilirubin Urine NEGATIVE NEGATIVE   Ketones, ur NEGATIVE NEGATIVE mg/dL   Protein, ur NEGATIVE NEGATIVE mg/dL  Nitrite NEGATIVE NEGATIVE   Leukocytes,Ua NEGATIVE NEGATIVE   RBC / HPF 0-5 0 - 5 RBC/hpf   WBC, UA 0-5 0 - 5 WBC/hpf    Comment:        Reflex urine culture not performed if WBC <=10, OR if Squamous epithelial cells >5. If Squamous epithelial cells >5 suggest recollection.    Bacteria, UA RARE (A) NONE SEEN   Squamous Epithelial / HPF 0-5 0 - 5 /HPF   Mucus PRESENT     Comment: Performed at [facility], [address] [place], [place] [address]   *Note: Due to a large number of results and/or encounters for the requested time period, some results have not been displayed. A complete set of results can be found in Results Review.   DG Chest Port 1 View  Result Date: 08/04/2023 CLINICAL DATA:  History of multiple myeloma with sepsis EXAM: PORTABLE CHEST 1 VIEW COMPARISON:  Chest radiograph dated 06/20/2023 FINDINGS: Unchanged elevation of the right hemidiaphragm. Normal lung volumes. Heterogeneous left retrocardiac opacity. No pleural effusion or pneumothorax. The heart size and mediastinal contours are within normal limits. No acute osseous abnormality. IMPRESSION: 1. Heterogeneous left retrocardiac opacity, likely reflecting known hernia. 2.  No new focal consolidations. Electronically Signed   By: Agustin Cree M.D.   On: 08/04/2023 12:01    Pending Labs Unresulted Labs (From admission, onward)     Start     Ordered   08/05/23 0500  CBC with Differential  Daily,   R      08/04/23 1433   08/05/23 0500  Comprehensive metabolic panel  Tomorrow morning,   R        08/04/23 1433   08/04/23 1100  Blood Culture (routine x 2)  (Undifferentiated presentation (screening labs and basic nursing orders))  BLOOD CULTURE X 2,   STAT      08/04/23 1100            Vitals/Pain Today's Vitals    08/04/23 1300 08/04/23 1315 08/04/23 1330 08/04/23 1400  BP: (!) 153/72 (!) 150/73 130/79 (!) 160/74  Pulse: 81 82 82 78  Resp: 12 12 17 17   Temp:      TempSrc:      SpO2: 95% 95% 94% 93%  Weight:      Height:      PainSc:        Isolation Precautions No active isolations  Medications Medications  sodium chloride flush (NS) 0.9 % injection 10 mL (10 mLs Intravenous Given 08/04/23 1513)  metroNIDAZOLE (FLAGYL) IVPB 500 mg (has no administration in time range)  vancomycin (VANCOCIN) IVPB 1000 mg/200 mL premix (1,000 mg Intravenous New Bag/Given 08/04/23 1512)  ceFEPIme (MAXIPIME) 2 g in sodium chloride 0.9 % 100 mL IVPB (has no administration in time range)  acetaminophen (TYLENOL) tablet 650 mg (has no administration in time range)    Or  acetaminophen (TYLENOL) suppository 650 mg (has no administration in time range)  ondansetron (ZOFRAN) tablet 4 mg (has no administration in time range)    Or  ondansetron (ZOFRAN) injection 4 mg (has no administration in time range)  lactated ringers bolus 1,000 mL (0 mLs Intravenous Stopped 08/04/23 1344)  acetaminophen (TYLENOL) tablet 325 mg (325 mg Oral Given 08/04/23 1122)  metroNIDAZOLE (FLAGYL) IVPB 500 mg (0 mg Intravenous Stopped 08/04/23 1344)  ceFEPIme (MAXIPIME) 2 g in sodium chloride 0.9 % 100 mL IVPB (0 g Intravenous Stopped 08/04/23 1228)    Mobility manual wheelchair  Focused Assessments Cardiac Assessment Handoff:    Lab Results  Component Value Date   CKTOTAL 28 (L) 05/22/2023   CKMB 2.1 05/03/2010   TROPONINI <0.03 02/26/2016   Lab Results  Component Value Date   DDIMER (H) 05/02/2010    0.67        AT THE INHOUSE ESTABLISHED CUTOFF VALUE OF 0.48 ug/mL FEU, THIS ASSAY HAS BEEN DOCUMENTED IN THE LITERATURE TO HAVE A SENSITIVITY AND NEGATIVE PREDICTIVE VALUE OF AT LEAST 98 TO 99%.  THE TEST RESULT SHOULD BE CORRELATED WITH AN ASSESSMENT OF THE CLINICAL PROBABILITY OF DVT / VTE.   Does the Patient  currently have chest pain? No    R Recommendations: See Admitting Provider Note  Report given to:   Additional Notes:

## 2023-08-04 NOTE — Plan of Care (Signed)
  Problem: Education: Goal: Knowledge of General Education information will improve Description: Including pain rating scale, medication(s)/side effects and non-pharmacologic comfort measures Outcome: Completed/Met

## 2023-08-04 NOTE — ED Notes (Signed)
ED TO INPATIENT HANDOFF REPORT  ED Nurse Name and Phone #: Reita Cliche RN  S Name/Age/Gender Andre [name] 72 y.o. male Room/Bed: RESA/RESA  Code Status   Code Status: Prior  Home/SNF/Other Home Patient oriented to: self, place, time, and situation Is this baseline? Yes   Triage Complete: Triage complete  Chief Complaint SIRS (systemic inflammatory response syndrome) (HCC) [R65.10]  Triage Note From [facility] multiple myeloma. Today reported fever. Ao x 4. O2 93% on 3L.   T101.7 RR 26 HR 114 148/90    Allergies Allergies  Allergen Reactions   Atorvastatin Other (See Comments)    (LIPITOR) "Allergic," per MAR   Rosuvastatin Other (See Comments)    (CRESTOR) Liver Disorder (adverse effects) and "Allergic," per MAR   Septra [Sulfamethoxazole-Trimethoprim] Rash and Other (See Comments)    "Allergic," per MAR    Level of Care/Admitting Diagnosis ED Disposition     ED Disposition  Admit   Condition  --   Comment  Hospital Area: [facility] [[id number]]  Level of Care: Med-Surg [[id number]]  May admit patient to [facility] or [name] if equivalent level of care is available:: No  Covid Evaluation: Asymptomatic - no recent exposure (last 10 days) testing not required  Diagnosis: SIRS (systemic inflammatory response syndrome) [facility]) [[id number]]  Admitting Physician: Bobette Mo [[id number]]  Attending Physician: Bobette Mo [[id number]]  Certification:: I certify this patient will need inpatient services for at least 2 midnights  Expected Medical Readiness: 08/06/2023          B Medical/Surgery History Past Medical History:  Diagnosis Date   Anemia    Arthritis    Asthma    no treatment x 20 years   Depression    Double vision    occurs at times    Duodenal ulcer    GERD (gastroesophageal reflux disease)    Hemorrhage of rectum and anus 09/15/2013   Hyperlipidemia    Hypertension    Hypothyroidism    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   Multiple myeloma 07/04/2011   Thyroid disease    TIA (transient ischemic attack) 03/27/2012   Transient diplopia 03/27/2012   Transient global amnesia 06/07/2015   Past Surgical History:  Procedure Laterality Date   BONE MARROW TRANSPLANT  2011   for MM   CARDIOLITE STUDY  11/25/2003   NORMAL   TONSILLECTOMY       A IV Location/Drains/Wounds Patient Lines/Drains/Airways Status     Active Line/Drains/Airways     Name Placement date Placement time Site Days   Peripheral IV Posterior;Right Hand --  --  Hand  --   Peripheral IV 08/04/23 20 G 1" Anterior;Left Forearm 08/04/23  1120  Forearm  less than 1            Intake/Output Last 24 hours  Intake/Output Summary (Last 24 hours) at 08/04/2023 1405 Last data filed at 08/04/2023 1344 Gross per 24 hour  Intake 1200.05 ml  Output --  Net 1200.05 ml    Labs/Imaging Results for orders placed or performed during the hospital encounter of 08/04/23 (from the past 48 hour(s))  Comprehensive metabolic panel     Status: Abnormal   Collection Time: 08/04/23 11:15 AM  Result Value Ref Range   Sodium 129 (L) 135 - 145 mmol/L   Potassium 3.8 3.5 - 5.1 mmol/L   Chloride 100 98 - 111 mmol/L   CO2 21 (L) 22 - 32 mmol/L   Glucose, Bld 95  70 - 99 mg/dL    Comment: Glucose reference range applies only to samples taken after fasting for at least 8 hours.   BUN 14 8 - 23 mg/dL   Creatinine, Ser 9.14 0.61 - 1.24 mg/dL   Calcium 8.5 (L) 8.9 - 10.3 mg/dL   Total Protein 6.9 6.5 - 8.1 g/dL   Albumin 3.4 (L) 3.5 - 5.0 g/dL   AST 24 15 - 41 U/L   ALT 18 0 - 44 U/L   Alkaline Phosphatase 74 38 - 126 U/L   Total Bilirubin 0.6 <1.2 mg/dL   GFR, Estimated >78 >29 mL/min    Comment: (NOTE) Calculated using the CKD-EPI Creatinine Equation (2021)    Anion gap 8 5 - 15    Comment: Performed at [facility], [address], [place], [place] [address]  CBC with Differential      Status: Abnormal   Collection Time: 08/04/23 11:15 AM  Result Value Ref Range   WBC 4.6 4.0 - 10.5 K/uL   RBC 2.35 (L) 4.22 - 5.81 MIL/uL   Hemoglobin 9.1 (L) 13.0 - 17.0 g/dL   HCT 08.6 (L) 57.8 - 46.9 %   MCV 119.1 (H) 80.0 - 100.0 fL   MCH 38.7 (H) 26.0 - 34.0 pg   MCHC 32.5 30.0 - 36.0 g/dL   RDW 62.9 (H) 52.8 - 41.3 %   Platelets 27 (LL) 150 - 400 K/uL    Comment: SPECIMEN CHECKED FOR CLOTS Immature Platelet Fraction may be clinically indicated, consider ordering this additional test [id number] REPEATED TO VERIFY PLATELET COUNT CONFIRMED BY SMEAR    nRBC 0.0 0.0 - 0.2 %   Neutrophils Relative % 71 %   Neutro Abs 3.3 1.7 - 7.7 K/uL   Lymphocytes Relative 5 %   Lymphs Abs 0.3 (L) 0.7 - 4.0 K/uL   Monocytes Relative 17 %   Monocytes Absolute 0.8 0.1 - 1.0 K/uL   Eosinophils Relative 1 %   Eosinophils Absolute 0.0 0.0 - 0.5 K/uL   Basophils Relative 1 %   Basophils Absolute 0.0 0.0 - 0.1 K/uL   Immature Granulocytes 5 %   Abs Immature Granulocytes 0.24 (H) 0.00 - 0.07 K/uL   Polychromasia PRESENT    Ovalocytes PRESENT     Comment: Performed at [facility], [address] [place], [place] [address]  Protime-INR     Status: None   Collection Time: 08/04/23 11:15 AM  Result Value Ref Range   Prothrombin Time 13.4 11.4 - 15.2 seconds   INR 1.0 0.8 - 1.2    Comment: (NOTE) INR goal varies based on device and disease states. Performed at [facility], [address] [place], [place] [address]   APTT     Status: None   Collection Time: 08/04/23 11:15 AM  Result Value Ref Range   aPTT 29 24 - 36 seconds    Comment: Performed at [facility], [address] [place], [place] [address]  I-Stat Lactic Acid, ED     Status: None   Collection Time: 08/04/23 11:22 AM  Result Value Ref Range   Lactic Acid, Venous 0.8 0.5 - 1.9 mmol/L  Resp panel by RT-PCR (RSV, Flu A&B, Covid) Anterior Nasal Swab     Status: None    Collection Time: 08/04/23 12:07 PM   Specimen: Anterior Nasal Swab  Result Value Ref Range   SARS Coronavirus 2 by RT PCR NEGATIVE NEGATIVE    Comment: (NOTE) SARS-CoV-2  target nucleic acids are NOT DETECTED.  The SARS-CoV-2 RNA is generally detectable in upper respiratory specimens during the acute phase of infection. The lowest concentration of SARS-CoV-2 viral copies this assay can detect is 138 copies/mL. A negative result does not preclude SARS-Cov-2 infection and should not be used as the sole basis for treatment or other patient management decisions. A negative result may occur with  improper specimen collection/handling, submission of specimen other than nasopharyngeal swab, presence of viral mutation(s) within the areas targeted by this assay, and inadequate number of viral copies(<138 copies/mL). A negative result must be combined with clinical observations, patient history, and epidemiological information. The expected result is Negative.  Fact Sheet for Patients:  BloggerCourse.com  Fact Sheet for Healthcare Providers:  SeriousBroker.it  This test is no t yet approved or cleared by the [place] FDA and  has been authorized for detection and/or diagnosis of SARS-CoV-2 by FDA under an Emergency Use Authorization (EUA). This EUA will remain  in effect (meaning this test can be used) for the duration of the COVID-19 declaration under Section 564(b)(1) of the Act, 21 U.S.C.section 360bbb-3(b)(1), unless the authorization is terminated  or revoked sooner.       Influenza A by PCR NEGATIVE NEGATIVE   Influenza B by PCR NEGATIVE NEGATIVE    Comment: (NOTE) The Xpert Xpress SARS-CoV-2/FLU/RSV plus assay is intended as an aid in the diagnosis of influenza from Nasopharyngeal swab specimens and should not be used as a sole basis for treatment. Nasal washings and aspirates are unacceptable for Xpert Xpress  SARS-CoV-2/FLU/RSV testing.  Fact Sheet for Patients: BloggerCourse.com  Fact Sheet for Healthcare Providers: SeriousBroker.it  This test is not yet approved or cleared by the [place] FDA and has been authorized for detection and/or diagnosis of SARS-CoV-2 by FDA under an Emergency Use Authorization (EUA). This EUA will remain in effect (meaning this test can be used) for the duration of the COVID-19 declaration under Section 564(b)(1) of the Act, 21 U.S.C. section 360bbb-3(b)(1), unless the authorization is terminated or revoked.     Resp Syncytial Virus by PCR NEGATIVE NEGATIVE    Comment: (NOTE) Fact Sheet for Patients: BloggerCourse.com  Fact Sheet for Healthcare Providers: SeriousBroker.it  This test is not yet approved or cleared by the [place] FDA and has been authorized for detection and/or diagnosis of SARS-CoV-2 by FDA under an Emergency Use Authorization (EUA). This EUA will remain in effect (meaning this test can be used) for the duration of the COVID-19 declaration under Section 564(b)(1) of the Act, 21 U.S.C. section 360bbb-3(b)(1), unless the authorization is terminated or revoked.  Performed at [facility], [address] [place], [place] [address]   Urinalysis, w/ Reflex to Culture (Infection Suspected) -Urine, Clean Catch     Status: Abnormal   Collection Time: 08/04/23 12:38 PM  Result Value Ref Range   Specimen Source URINE, CLEAN CATCH    Color, Urine YELLOW YELLOW   APPearance CLEAR CLEAR   Specific Gravity, Urine 1.014 1.005 - 1.030   pH 5.0 5.0 - 8.0   Glucose, UA NEGATIVE NEGATIVE mg/dL   Hgb urine dipstick SMALL (A) NEGATIVE   Bilirubin Urine NEGATIVE NEGATIVE   Ketones, ur NEGATIVE NEGATIVE mg/dL   Protein, ur NEGATIVE NEGATIVE mg/dL   Nitrite NEGATIVE NEGATIVE   Leukocytes,Ua NEGATIVE NEGATIVE   RBC / HPF 0-5  0 - 5 RBC/hpf   WBC, UA 0-5 0 - 5 WBC/hpf    Comment:  Reflex urine culture not performed if WBC <=10, OR if Squamous epithelial cells >5. If Squamous epithelial cells >5 suggest recollection.    Bacteria, UA RARE (A) NONE SEEN   Squamous Epithelial / HPF 0-5 0 - 5 /HPF   Mucus PRESENT     Comment: Performed at [facility], [address], [place], [place] [address]   *Note: Due to a large number of results and/or encounters for the requested time period, some results have not been displayed. A complete set of results can be found in Results Review.   DG Chest Port 1 View  Result Date: 08/04/2023 CLINICAL DATA:  History of multiple myeloma with sepsis EXAM: PORTABLE CHEST 1 VIEW COMPARISON:  Chest radiograph dated 06/20/2023 FINDINGS: Unchanged elevation of the right hemidiaphragm. Normal lung volumes. Heterogeneous left retrocardiac opacity. No pleural effusion or pneumothorax. The heart size and mediastinal contours are within normal limits. No acute osseous abnormality. IMPRESSION: 1. Heterogeneous left retrocardiac opacity, likely reflecting known hernia. 2.  No new focal consolidations. Electronically Signed   By: Agustin Cree M.D.   On: 08/04/2023 12:01    Pending Labs Unresulted Labs (From admission, onward)     Start     Ordered   08/04/23 1100  Blood Culture (routine x 2)  (Undifferentiated presentation (screening labs and basic nursing orders))  BLOOD CULTURE X 2,   STAT      08/04/23 1100            Vitals/Pain Today's Vitals   08/04/23 1253 08/04/23 1300 08/04/23 1315 08/04/23 1330  BP:  (!) 153/72 (!) 150/73 130/79  Pulse:  81 82 82  Resp:  12 12 17   Temp:      TempSrc:      SpO2:  95% 95% 94%  Weight:      Height:      PainSc: 0-No pain       Isolation Precautions No active isolations  Medications Medications  vancomycin (VANCOCIN) IVPB 1000 mg/200 mL premix (has no administration in time range)  lactated ringers bolus 1,000 mL  (0 mLs Intravenous Stopped 08/04/23 1344)  acetaminophen (TYLENOL) tablet 325 mg (325 mg Oral Given 08/04/23 1122)  metroNIDAZOLE (FLAGYL) IVPB 500 mg (0 mg Intravenous Stopped 08/04/23 1344)  ceFEPIme (MAXIPIME) 2 g in sodium chloride 0.9 % 100 mL IVPB (0 g Intravenous Stopped 08/04/23 1228)    Mobility manual wheelchair     Focused Assessments Cardiac Assessment Handoff:    Lab Results  Component Value Date   CKTOTAL 28 (L) 05/22/2023   CKMB 2.1 05/03/2010   TROPONINI <0.03 02/26/2016   Lab Results  Component Value Date   DDIMER (H) 05/02/2010    0.67        AT THE INHOUSE ESTABLISHED CUTOFF VALUE OF 0.48 ug/mL FEU, THIS ASSAY HAS BEEN DOCUMENTED IN THE LITERATURE TO HAVE A SENSITIVITY AND NEGATIVE PREDICTIVE VALUE OF AT LEAST 98 TO 99%.  THE TEST RESULT SHOULD BE CORRELATED WITH AN ASSESSMENT OF THE CLINICAL PROBABILITY OF DVT / VTE.   Does the Patient currently have chest pain? No    R Recommendations: See Admitting Provider Note  Report given to:   Additional Notes:

## 2023-08-04 NOTE — Progress Notes (Signed)
Patient states he does not wear a cpap at home.

## 2023-08-04 NOTE — Progress Notes (Signed)
Pharmacy Antibiotic Note  Andre [name] is a 72 y.o. male admitted on 08/04/2023 with sepsis.  Pharmacy has been consulted for vanc/cefepime dosing and flagyl per Md  Plan: Vancomycin 1g IV q12 - goal AUC 400-550 Cefepime 2g IV q8 per current renal function  Height: 5\' 7"  (170.2 cm) Weight: 82.6 kg (182 lb 1.6 oz) IBW/kg (Calculated) : 66.1  Temp (24hrs), Avg:100.8 F (38.2 C), Min:97.6 F (36.4 C), Max:103.1 F (39.5 C)  Recent Labs  Lab 08/02/23 0825 08/04/23 1115 08/04/23 1122  WBC 3.6* 4.6  --   CREATININE 0.92 0.78  --   LATICACIDVEN  --   --  0.8    Estimated Creatinine Clearance: 83.3 mL/min (by C-G formula based on SCr of 0.78 mg/dL).    Allergies  Allergen Reactions   Atorvastatin Other (See Comments)    (LIPITOR) "Allergic," per MAR   Rosuvastatin Other (See Comments)    (CRESTOR) Liver Disorder (adverse effects) and "Allergic," per MAR   Septra [Sulfamethoxazole-Trimethoprim] Rash and Other (See Comments)    "Allergic," per [facility]      Thank you for allowing pharmacy to be a part of this patient's care.  Berkley Harvey 08/04/2023 2:39 PM

## 2023-08-04 NOTE — ED Provider Notes (Signed)
[place] EMERGENCY DEPARTMENT AT [facility] Provider Note   CSN: [id number] Arrival date & time: 08/04/23  1046     History  Chief Complaint  Patient presents with   Fever    Andre [name] is a 72 y.o. male.  Patient to ED from [facility] with fever that reportedly began this morning. The patient denies pain, significant cough, dysuria, vomiting. He is currently being treated for multiple myeloma, last treatment 2 days ago. He is also reported to be on 3L O2 by [place] chronically.   The history is provided by the patient and the EMS personnel. No language interpreter was used.  Fever      Home Medications Prior to Admission medications   Medication Sig Start Date End Date Taking? Authorizing Provider  acetaminophen (TYLENOL) 500 MG tablet Take 2 tablets (1,000 mg total) by mouth every 8 (eight) hours. 06/20/23   Sheikh, Omair Latif, DO  CALCIUM 500 + D3 500-5 MG-MCG TABS Take 1 tablet by mouth in the morning.    [provider]  calcium carbonate (TUMS EX) 750 MG chewable tablet Chew 2 tablets by mouth 3 (three) times daily as needed (for indigestion).    [provider]  Cholecalciferol (VITAMIN D3) 25 MCG (1000 UT) capsule Take 1,000 Units by mouth daily.    [provider]  cyanocobalamin (VITAMIN B12) 1000 MCG tablet Take 1 tablet (1,000 mcg total) by mouth daily. 04/09/23   Rana Snare, NP  dapsone 100 MG tablet Take 100 mg by mouth daily.    Ladene Artist, MD  docusate sodium (COLACE) 100 MG capsule Take 100 mg by mouth daily as needed (for constipation).    [provider]  escitalopram (LEXAPRO) 10 MG tablet Take 1 tablet (10 mg total) by mouth daily. 06/27/22   [name], Georgina Quint, MD  ferrous sulfate 325 (65 FE) MG EC tablet Take 325 mg by mouth 2 (two) times daily.    [provider]  folic acid (FOLVITE) 1 MG tablet Take 1 tablet (1 mg total) by mouth daily. 04/09/23   Rana Snare, NP  levothyroxine  (SYNTHROID) 125 MCG tablet Take 125 mcg by mouth daily before breakfast. 04/12/23   [provider]  Menthol-Methyl Salicylate (MUSCLE RUB) 10-15 % CREA Apply 1 Application topically as needed for muscle pain. 06/20/23   Marguerita Merles Latif, DO  methylphenidate (RITALIN) 5 MG tablet Take 0.5 tablets (2.5 mg total) by mouth every morning. 07/26/23   Fletcher Anon, NP  midodrine (PROAMATINE) 2.5 MG tablet Take 1 tablet (2.5 mg total) by mouth 2 (two) times daily with a meal. 07/12/23   Fletcher Anon, NP  ondansetron (ZOFRAN) 4 MG tablet Take 4 mg by mouth every 8 (eight) hours as needed for nausea or vomiting.    [provider]  pantoprazole (PROTONIX) 40 MG tablet Take 1 tablet (40 mg total) by mouth daily. 04/20/23   Fletcher Anon, NP  Probiotic Product (UP4 PROBIOTICS WOMENS PO) Take 1 capsule by mouth daily.    [provider]  tamsulosin (FLOMAX) 0.4 MG CAPS capsule Take 0.4 mg by mouth daily. 06/29/23   [provider]  valACYclovir (VALTREX) 500 MG tablet Take 500 mg by mouth daily.    [provider]  prochlorperazine (COMPAZINE) 10 MG tablet Take 1 tablet (10 mg total) by mouth every 6 (six) hours as needed (Nausea or vomiting). 12/14/20 12/12/21  Magrinat, Valentino Hue, MD      Allergies  Atorvastatin, Rosuvastatin, and Septra [sulfamethoxazole-trimethoprim]    Review of Systems   Review of Systems  Constitutional:  Positive for fever.    Physical Exam Updated Vital Signs BP 130/79   Pulse 82   Temp 97.6 F (36.4 C)   Resp 17   Ht 5\' 7"  (1.702 m)   Wt 82.6 kg   SpO2 94%   BMI 28.52 kg/m  Physical Exam Vitals and nursing note reviewed.  Constitutional:      Appearance: Normal appearance. He is normal weight.  HENT:     Head: Normocephalic.     Mouth/Throat:     Mouth: Mucous membranes are moist.  Cardiovascular:     Rate and Rhythm: Regular rhythm. Tachycardia present.     Heart sounds: No murmur heard. Pulmonary:      Effort: Pulmonary effort is normal.     Breath sounds: No wheezing, rhonchi or rales.  Abdominal:     General: There is no distension.     Palpations: Abdomen is soft.     Tenderness: There is no abdominal tenderness.  Musculoskeletal:        General: Normal range of motion.     Cervical back: Normal range of motion and neck supple.  Skin:    General: Skin is warm and dry.     Findings: No rash.  Neurological:     Mental Status: He is alert.     ED Results / Procedures / Treatments   Labs (all labs ordered are listed, but only abnormal results are displayed) Labs Reviewed  COMPREHENSIVE METABOLIC PANEL - Abnormal; Notable for the following components:      Result Value   Sodium 129 (*)    CO2 21 (*)    Calcium 8.5 (*)    Albumin 3.4 (*)    All other components within normal limits  CBC WITH DIFFERENTIAL/PLATELET - Abnormal; Notable for the following components:   RBC 2.35 (*)    Hemoglobin 9.1 (*)    HCT 28.0 (*)    MCV 119.1 (*)    MCH 38.7 (*)    RDW 15.8 (*)    Platelets 27 (*)    Lymphs Abs 0.3 (*)    Abs Immature Granulocytes 0.24 (*)    All other components within normal limits  URINALYSIS, W/ REFLEX TO CULTURE (INFECTION SUSPECTED) - Abnormal; Notable for the following components:   Hgb urine dipstick SMALL (*)    Bacteria, UA RARE (*)    All other components within normal limits  RESP PANEL BY RT-PCR (RSV, FLU A&B, COVID)  RVPGX2  CULTURE, BLOOD (ROUTINE X 2)  CULTURE, BLOOD (ROUTINE X 2)  PROTIME-INR  APTT  I-STAT CG4 LACTIC ACID, ED   Results for orders placed or performed during the hospital encounter of 08/04/23  Resp panel by RT-PCR (RSV, Flu A&B, Covid) Anterior Nasal Swab   Specimen: Anterior Nasal Swab  Result Value Ref Range   SARS Coronavirus 2 by RT PCR NEGATIVE NEGATIVE   Influenza A by PCR NEGATIVE NEGATIVE   Influenza B by PCR NEGATIVE NEGATIVE   Resp Syncytial Virus by PCR NEGATIVE NEGATIVE  Comprehensive metabolic panel  Result Value  Ref Range   Sodium 129 (L) 135 - 145 mmol/L   Potassium 3.8 3.5 - 5.1 mmol/L   Chloride 100 98 - 111 mmol/L   CO2 21 (L) 22 - 32 mmol/L   Glucose, Bld 95 70 - 99 mg/dL   BUN 14 8 - 23 mg/dL   Creatinine,  Ser 0.78 0.61 - 1.24 mg/dL   Calcium 8.5 (L) 8.9 - 10.3 mg/dL   Total Protein 6.9 6.5 - 8.1 g/dL   Albumin 3.4 (L) 3.5 - 5.0 g/dL   AST 24 15 - 41 U/L   ALT 18 0 - 44 U/L   Alkaline Phosphatase 74 38 - 126 U/L   Total Bilirubin 0.6 <1.2 mg/dL   GFR, Estimated >65 >78 mL/min   Anion gap 8 5 - 15  CBC with Differential  Result Value Ref Range   WBC 4.6 4.0 - 10.5 K/uL   RBC 2.35 (L) 4.22 - 5.81 MIL/uL   Hemoglobin 9.1 (L) 13.0 - 17.0 g/dL   HCT 46.9 (L) 62.9 - 52.8 %   MCV 119.1 (H) 80.0 - 100.0 fL   MCH 38.7 (H) 26.0 - 34.0 pg   MCHC 32.5 30.0 - 36.0 g/dL   RDW 41.3 (H) 24.4 - 01.0 %   Platelets 27 (LL) 150 - 400 K/uL   nRBC 0.0 0.0 - 0.2 %   Neutrophils Relative % 71 %   Neutro Abs 3.3 1.7 - 7.7 K/uL   Lymphocytes Relative 5 %   Lymphs Abs 0.3 (L) 0.7 - 4.0 K/uL   Monocytes Relative 17 %   Monocytes Absolute 0.8 0.1 - 1.0 K/uL   Eosinophils Relative 1 %   Eosinophils Absolute 0.0 0.0 - 0.5 K/uL   Basophils Relative 1 %   Basophils Absolute 0.0 0.0 - 0.1 K/uL   Immature Granulocytes 5 %   Abs Immature Granulocytes 0.24 (H) 0.00 - 0.07 K/uL   Polychromasia PRESENT    Ovalocytes PRESENT   Protime-INR  Result Value Ref Range   Prothrombin Time 13.4 11.4 - 15.2 seconds   INR 1.0 0.8 - 1.2  APTT  Result Value Ref Range   aPTT 29 24 - 36 seconds  Urinalysis, w/ Reflex to Culture (Infection Suspected) -Urine, Clean Catch  Result Value Ref Range   Specimen Source URINE, CLEAN CATCH    Color, Urine YELLOW YELLOW   APPearance CLEAR CLEAR   Specific Gravity, Urine 1.014 1.005 - 1.030   pH 5.0 5.0 - 8.0   Glucose, UA NEGATIVE NEGATIVE mg/dL   Hgb urine dipstick SMALL (A) NEGATIVE   Bilirubin Urine NEGATIVE NEGATIVE   Ketones, ur NEGATIVE NEGATIVE mg/dL   Protein, ur  NEGATIVE NEGATIVE mg/dL   Nitrite NEGATIVE NEGATIVE   Leukocytes,Ua NEGATIVE NEGATIVE   RBC / HPF 0-5 0 - 5 RBC/hpf   WBC, UA 0-5 0 - 5 WBC/hpf   Bacteria, UA RARE (A) NONE SEEN   Squamous Epithelial / HPF 0-5 0 - 5 /HPF   Mucus PRESENT   I-Stat Lactic Acid, ED  Result Value Ref Range   Lactic Acid, Venous 0.8 0.5 - 1.9 mmol/L   *Note: Due to a large number of results and/or encounters for the requested time period, some results have not been displayed. A complete set of results can be found in Results Review.    EKG None  Radiology DG Chest Port 1 View  Result Date: 08/04/2023 CLINICAL DATA:  History of multiple myeloma with sepsis EXAM: PORTABLE CHEST 1 VIEW COMPARISON:  Chest radiograph dated 06/20/2023 FINDINGS: Unchanged elevation of the right hemidiaphragm. Normal lung volumes. Heterogeneous left retrocardiac opacity. No pleural effusion or pneumothorax. The heart size and mediastinal contours are within normal limits. No acute osseous abnormality. IMPRESSION: 1. Heterogeneous left retrocardiac opacity, likely reflecting known hernia. 2.  No new focal consolidations. Electronically Signed  By: Agustin Cree M.D.   On: 08/04/2023 12:01    Procedures .Critical Care  Performed by: Elpidio Anis, PA-C Authorized by: Elpidio Anis, PA-C   Critical care provider statement:    Critical care time (minutes):  30   Critical care was necessary to treat or prevent imminent or life-threatening deterioration of the following conditions:  Sepsis   Critical care was time spent personally by me on the following activities:  Discussions with consultants, evaluation of patient's response to treatment, examination of patient, ordering and review of laboratory studies, ordering and review of radiographic studies, pulse oximetry, re-evaluation of patient's condition and review of old charts     Medications Ordered in ED Medications  vancomycin (VANCOCIN) IVPB 1000 mg/200 mL premix (has no  administration in time range)  lactated ringers bolus 1,000 mL (0 mLs Intravenous Stopped 08/04/23 1344)  acetaminophen (TYLENOL) tablet 325 mg (325 mg Oral Given 08/04/23 1122)  metroNIDAZOLE (FLAGYL) IVPB 500 mg (0 mg Intravenous Stopped 08/04/23 1344)  ceFEPIme (MAXIPIME) 2 g in sodium chloride 0.9 % 100 mL IVPB (0 g Intravenous Stopped 08/04/23 1228)    ED Course/ Medical Decision Making/ A&P                                 Medical Decision Making This patient presents to the ED for concern of fever, this involves an extensive number of treatment options, and is a complaint that carries with it a high risk of complications and morbidity.  The differential diagnosis includes sepsis, pneumonia, UTI, neutropenic fever   Co morbidities that complicate the patient evaluation  Multiple Myeloma currently receiving chemo, HTN, HLD, thyroid disease, asthma   Additional history obtained:  Additional history obtained from EMS External records from outside source obtained and reviewed including EMR chart reviewed: Recent hospitalization for anemia 9/26 - 10/2 - received transfusion H/o + blood cultures in the past - negative x 2 this recent admission.  H/O +pseudomonas UTI by culture   Lab Tests:  I Ordered, and personally interpreted labs.  The pertinent results include:  CBC - no leukocytosis, no neutropenia (WBC 4.6, Neut# 3.3). Mild hyponatremia at 129. Mild anemia 9.1, c/w baseline. Thrombocytopenia of 27, c/w baseline. Urine - negative for infection. Viral panel negative. Lactic acid 0.8.   Imaging Studies ordered:  I ordered imaging studies including CXR  I independently visualized and interpreted imaging which showed No focal consolidations or infiltrates I agree with the radiologist interpretation   Cardiac Monitoring: / EKG:  The patient was maintained on a cardiac monitor.  I personally viewed and interpreted the cardiac monitored which showed an underlying rhythm of:  Sinus tachycardia    Problem List / ED Course / Critical interventions / Medication management  Patient with h/o multiple myeloma on chemo, fever to 103 on arrival with some confusion. Afebrile now and far less confused.  H/O bacteremia, cultures negative on last admission CXR negative, urine negative, viral panel negative. Cultures pending.  Abx for undifferentiated sepsis started in ED given initial presentation.  He will require admission for ongoing evaluation and management.   I ordered medication including tylenol  for fever  Reevaluation of the patient after these medicines showed that the patient resolved I have reviewed the patients home medicines and have made adjustments as needed   Social Determinants of Health:  Cancer patient on chemo, living in facility ([facility])   Test / Admission -  Considered:  Admit for presentation c/w sepsis/evolving sepsis.     Amount and/or Complexity of Data Reviewed Labs: ordered. Radiology: ordered. ECG/medicine tests: ordered.  Risk OTC drugs. Prescription drug management.           Final Clinical Impression(s) / ED Diagnoses Final diagnoses:  Febrile illness  H/O multiple myeloma    Rx / DC Orders ED Discharge Orders     None         [place]ne [place] 08/04/23 1403    Lorre Nick, MD 08/05/23 [phone]

## 2023-08-04 NOTE — H&P (Signed)
History and Physical    Patient: Andre [name] YNW:[id number] DOB: 23-Dec-1950 DOA: 08/04/2023 DOS: the patient was seen and examined on 08/04/2023 PCP: [name], MD  Patient coming from: SNF  Chief Complaint:  Chief Complaint  Patient presents with   Fever   HPI: Andre [name] is a 72 y.o. male with medical history significant of multiple myeloma not in remission, thrombocytopenia, anemia, chemotherapy-induced leukopenia, osteoarthritis, asthma, depression, diplopia, duodenal ulcer, GERD, lower GI bleed, hyperlipidemia, hypertension, hypothyroidism, transient ischemic attack, transient global amnesia who presented to the emergency department with mild MS change and fever.  He did not feel 100% yesterday.  No sick contacts to his knowledge or travel history. He denied fever, chills, rhinorrhea, sore throat, wheezing or hemoptysis.  No chest pain, palpitations, diaphoresis, PND, orthopnea or pitting edema of the lower extremities.  No abdominal pain, nausea, emesis, diarrhea, constipation, melena or hematochezia.  No flank pain, dysuria, frequency or hematuria.  No polyuria, polydipsia, polyphagia or blurred vision.   Lab work: He is urine analysis shows small hemoglobin and rare bacteria.  CBC showed a white count of 4.6 with 71% neutrophils, hemoglobin 9.1 g/dL and platelets 27.  Normal PT, INR and PTT.  Coronavirus influenza and RSV PCR negative.  CMP showed a sodium 129 and CO2 of 21 mmol/L with a normal anion gap.  Albumin was 3.4 g/dL, the rest of the CMP measurements were normal after calcium correction.  Imaging: Portable 1 view chest radiograph showing heterogenous left retrocardiac opacity, likely reflecting known hernia.  No new focal consolidations.  ED course: Initial vital signs were temperature 103.1 F, pulse 105, respiration 20, BP 134/94 mmHg O2 sat 90% on nasal cannula at 3 LPM.  The patient received acetaminophen 325 mg IVP, LR 1000 mL liter bolus, cefepime,  metronidazole and vancomycin.   Review of Systems: As mentioned in the history of present illness. All other systems reviewed and are negative.  Past Medical History:  Diagnosis Date   Anemia    Arthritis    Asthma    no treatment x 20 years   Depression    Double vision    occurs at times    Duodenal ulcer    GERD (gastroesophageal reflux disease)    Hemorrhage of rectum and anus 09/15/2013   Hyperlipidemia    Hypertension    Hypothyroidism    Leukopenia due to antineoplastic chemotherapy (HCC) 02/22/2017   Multiple myeloma 07/04/2011   Thyroid disease    TIA (transient ischemic attack) 03/27/2012   Transient diplopia 03/27/2012   Transient global amnesia 06/07/2015   Past Surgical History:  Procedure Laterality Date   BONE MARROW TRANSPLANT  2011   for MM   CARDIOLITE STUDY  11/25/2003   NORMAL   TONSILLECTOMY     Social History:  reports that he quit smoking about 54 years ago. His smoking use included cigarettes. He started smoking about 57 years ago. He has never used smokeless tobacco. He reports that he does not drink alcohol and does not use drugs.  Allergies  Allergen Reactions   Atorvastatin Other (See Comments)    (LIPITOR) "Allergic," per MAR   Rosuvastatin Other (See Comments)    (CRESTOR) Liver Disorder (adverse effects) and "Allergic," per MAR   Septra [Sulfamethoxazole-Trimethoprim] Rash and Other (See Comments)    "Allergic," per [facility]    Family History  Problem Relation Age of Onset   Throat cancer Mother    Hypertension Father    Stroke Father  Asthma Father    Diabetes Father    Pancreatic cancer Brother     Prior to Admission medications   Medication Sig Start Date End Date Taking? Authorizing Provider  acetaminophen (TYLENOL) 500 MG tablet Take 2 tablets (1,000 mg total) by mouth every 8 (eight) hours. 06/20/23   Sheikh, Omair Latif, DO  CALCIUM 500 + D3 500-5 MG-MCG TABS Take 1 tablet by mouth in the morning.    [provider]   calcium carbonate (TUMS EX) 750 MG chewable tablet Chew 2 tablets by mouth 3 (three) times daily as needed (for indigestion).    [provider]  Cholecalciferol (VITAMIN D3) 25 MCG (1000 UT) capsule Take 1,000 Units by mouth daily.    [provider]  cyanocobalamin (VITAMIN B12) 1000 MCG tablet Take 1 tablet (1,000 mcg total) by mouth daily. 04/09/23   Rana Snare, NP  dapsone 100 MG tablet Take 100 mg by mouth daily.    Ladene Artist, MD  docusate sodium (COLACE) 100 MG capsule Take 100 mg by mouth daily as needed (for constipation).    [provider]  escitalopram (LEXAPRO) 10 MG tablet Take 1 tablet (10 mg total) by mouth daily. 06/27/22   [name], Georgina Quint, MD  ferrous sulfate 325 (65 FE) MG EC tablet Take 325 mg by mouth 2 (two) times daily.    [provider]  folic acid (FOLVITE) 1 MG tablet Take 1 tablet (1 mg total) by mouth daily. 04/09/23   Rana Snare, NP  levothyroxine (SYNTHROID) 125 MCG tablet Take 125 mcg by mouth daily before breakfast. 04/12/23   [provider]  Menthol-Methyl Salicylate (MUSCLE RUB) 10-15 % CREA Apply 1 Application topically as needed for muscle pain. 06/20/23   Marguerita Merles Latif, DO  methylphenidate (RITALIN) 5 MG tablet Take 0.5 tablets (2.5 mg total) by mouth every morning. 07/26/23   Fletcher Anon, NP  midodrine (PROAMATINE) 2.5 MG tablet Take 1 tablet (2.5 mg total) by mouth 2 (two) times daily with a meal. 07/12/23   Fletcher Anon, NP  ondansetron (ZOFRAN) 4 MG tablet Take 4 mg by mouth every 8 (eight) hours as needed for nausea or vomiting.    [provider]  pantoprazole (PROTONIX) 40 MG tablet Take 1 tablet (40 mg total) by mouth daily. 04/20/23   Fletcher Anon, NP  Probiotic Product (UP4 PROBIOTICS WOMENS PO) Take 1 capsule by mouth daily.    [provider]  tamsulosin (FLOMAX) 0.4 MG CAPS capsule Take 0.4 mg by mouth daily. 06/29/23   [provider]   valACYclovir (VALTREX) 500 MG tablet Take 500 mg by mouth daily.    [provider]  prochlorperazine (COMPAZINE) 10 MG tablet Take 1 tablet (10 mg total) by mouth every 6 (six) hours as needed (Nausea or vomiting). 12/14/20 12/12/21  Magrinat, Valentino Hue, MD    Physical Exam: Vitals:   08/04/23 1245 08/04/23 1300 08/04/23 1315 08/04/23 1330  BP: (!) 163/82 (!) 153/72 (!) 150/73 130/79  Pulse: 87 81 82 82  Resp: 17 12 12 17   Temp:      TempSrc:      SpO2: 94% 95% 95% 94%  Weight:      Height:       Physical Exam Vitals and nursing note reviewed.  Constitutional:      General: He is awake. He is not in acute distress.    Appearance: Normal appearance. He is ill-appearing. He is not diaphoretic.  HENT:  Head: Normocephalic.     Nose: No rhinorrhea.     Mouth/Throat:     Mouth: Mucous membranes are dry.  Eyes:     General: No scleral icterus.    Pupils: Pupils are equal, round, and reactive to light.  Neck:     Vascular: No JVD.  Cardiovascular:     Rate and Rhythm: Normal rate and regular rhythm.     Heart sounds: S1 normal and S2 normal.  Pulmonary:     Effort: Pulmonary effort is normal.     Breath sounds: Normal breath sounds. No wheezing, rhonchi or rales.  Abdominal:     General: Bowel sounds are normal. There is no distension.     Palpations: Abdomen is soft.     Tenderness: There is no abdominal tenderness.  Musculoskeletal:     Cervical back: Neck supple.     Right lower leg: No edema.     Left lower leg: No edema.  Skin:    General: Skin is warm and dry.  Neurological:     General: No focal deficit present.     Mental Status: He is alert and oriented to person, place, and time.  Psychiatric:        Mood and Affect: Mood normal.        Behavior: Behavior normal. Behavior is cooperative.    Data Reviewed:  Results are pending, will review when available. EKG: Vent. rate 106 BPM PR interval 168 ms QRS duration 95 ms QT/QTcB 343/456 ms P-R-T  axes 66 -59 79 Sinus tachycardia Atrial premature complex Left anterior fascicular block Abnormal R-wave progression, late transition  Assessment and Plan: Principal Problem:   SIRS (systemic inflammatory response syndrome) (HCC) Admit to PCU/inpatient. Continue IV fluids. Continue cefepime 2 g every 8 hours.   Continue metronidazole 500 mg IVPB q 12 hr. Continue vancomycin per pharmacy. Follow-up blood culture and sensitivity Follow CBC and CMP in a.m.  Active Problems:   Acute metabolic encephalopathy Superimposed on:   Memory problem Secondary to principal problem. Seems to have improved substantially after antibiotics/IV fluids.    Thrombocytopenia (HCC) Monitor platelet count.    Multiple myeloma not having achieved remission [facility]) Follow-up with oncology as scheduled.    GERD (gastroesophageal reflux disease) Continue pantoprazole 40 mg p.o. daily.    Hyperlipidemia Currently not on therapy. Follow-up with PCP.    Sleep apnea CPAP at bedtime.    Bone marrow transplant status (HCC) Continue valacyclovir 500 mg p.o. daily. Continue with dapsone 100 mg p.o. daily.    Macrocytic anemia Continue cyanocobalamin and folate supplementation.    Hypotension Continue midodrine 2.5 mg twice daily.    Hypothyroidism Continue levothyroxine 125 mcg p.o. daily.      Advance Care Planning:   Code Status: Limited: Do not attempt resuscitation (DNR) -DNR-LIMITED -Do Not Intubate/DNI    Consults:   Family Communication:   Severity of Illness: The appropriate patient status for this patient is INPATIENT. Inpatient status is judged to be reasonable and necessary in order to provide the required intensity of service to ensure the patient's safety. [name] presenting symptoms, physical exam findings, and initial radiographic and laboratory data in the context of their chronic comorbidities is felt to place them at high risk for further clinical deterioration.  Furthermore, it is not anticipated that the patient will be medically stable for discharge from the hospital within 2 midnights of admission.   * I certify that at the point of admission it is my clinical  judgment that the patient will require inpatient hospital care spanning beyond 2 midnights from the point of admission due to high intensity of service, high risk for further deterioration and high frequency of surveillance required.*  Author: Bobette Mo, MD 08/04/2023 1:41 PM  For on call review [email or web address].   This document was prepared using Dragon voice recognition software and may contain some unintended transcription errors.

## 2023-08-04 NOTE — ED Provider Notes (Signed)
I provided a substantive portion of the care of this patient.  I personally made/approved the management plan for this patient and take responsibility for the patient management.      72 year old male presents with fever.  History of cancer and is currently getting chemotherapy.  Sepsis protocol started and patient will be admitted to the hospital   Lorre Nick, MD 08/04/23 1215

## 2023-08-05 ENCOUNTER — Inpatient Hospital Stay (HOSPITAL_COMMUNITY): Payer: Medicare Other

## 2023-08-05 DIAGNOSIS — R6510 Systemic inflammatory response syndrome (SIRS) of non-infectious origin without acute organ dysfunction: Secondary | ICD-10-CM | POA: Diagnosis not present

## 2023-08-05 DIAGNOSIS — C9000 Multiple myeloma not having achieved remission: Secondary | ICD-10-CM

## 2023-08-05 DIAGNOSIS — G3184 Mild cognitive impairment, so stated: Secondary | ICD-10-CM

## 2023-08-05 DIAGNOSIS — D61818 Other pancytopenia: Secondary | ICD-10-CM | POA: Diagnosis not present

## 2023-08-05 LAB — RESPIRATORY PANEL BY PCR

## 2023-08-05 LAB — CBC WITH DIFFERENTIAL/PLATELET
Abs Immature Granulocytes: 0.25 10*3/uL — ABNORMAL HIGH (ref 0.00–0.07)
Basophils Absolute: 0 10*3/uL (ref 0.0–0.1)
Basophils Relative: 1 %
Eosinophils Absolute: 0.1 10*3/uL (ref 0.0–0.5)
Eosinophils Relative: 2 %
HCT: 29.4 % — ABNORMAL LOW (ref 39.0–52.0)
Hemoglobin: 9.2 g/dL — ABNORMAL LOW (ref 13.0–17.0)
Immature Granulocytes: 7 %
Lymphocytes Relative: 5 %
Lymphs Abs: 0.2 10*3/uL — ABNORMAL LOW (ref 0.7–4.0)
MCH: 37.9 pg — ABNORMAL HIGH (ref 26.0–34.0)
MCHC: 31.3 g/dL (ref 30.0–36.0)
MCV: 121 fL — ABNORMAL HIGH (ref 80.0–100.0)
Monocytes Absolute: 0.6 10*3/uL (ref 0.1–1.0)
Monocytes Relative: 17 %
Neutro Abs: 2.5 10*3/uL (ref 1.7–7.7)
Neutrophils Relative %: 68 %
Platelets: 24 10*3/uL — CL (ref 150–400)
RBC: 2.43 MIL/uL — ABNORMAL LOW (ref 4.22–5.81)
RDW: 15.4 % (ref 11.5–15.5)
WBC: 3.6 10*3/uL — ABNORMAL LOW (ref 4.0–10.5)
nRBC: 0 % (ref 0.0–0.2)

## 2023-08-05 LAB — COMPREHENSIVE METABOLIC PANEL
ALT: 16 U/L (ref 0–44)
AST: 20 U/L (ref 15–41)
Albumin: 3.3 g/dL — ABNORMAL LOW (ref 3.5–5.0)
Alkaline Phosphatase: 71 U/L (ref 38–126)
Anion gap: 8 (ref 5–15)
BUN: 14 mg/dL (ref 8–23)
CO2: 25 mmol/L (ref 22–32)
Calcium: 9.1 mg/dL (ref 8.9–10.3)
Chloride: 104 mmol/L (ref 98–111)
Creatinine, Ser: 0.8 mg/dL (ref 0.61–1.24)
GFR, Estimated: >60 mL/min (ref >60)
Glucose, Bld: 94 mg/dL (ref 70–99)
Potassium: 4.3 mmol/L (ref 3.5–5.1)
Sodium: 137 mmol/L (ref 135–145)
Total Bilirubin: 0.7 mg/dL (ref <1.2)
Total Protein: 6.8 g/dL (ref 6.5–8.1)

## 2023-08-05 MED ORDER — TRAMADOL HCL 50 MG PO TABS
50.0000 mg | ORAL_TABLET | Freq: Two times a day (BID) | ORAL | Status: DC | PRN
  Administered 2023-08-05: 50 mg via ORAL
  Filled 2023-08-05: qty 1

## 2023-08-05 NOTE — Assessment & Plan Note (Addendum)
Thrombocytopenia Macrocytic anemia Counts at baseline, no bleeding, no neutropenia. - Continue iron, folate - Trend CBC

## 2023-08-05 NOTE — Evaluation (Signed)
Physical Therapy Evaluation Patient Details Name: Andre [name] MRN: [id number] DOB: Nov 11, 1950 Today's Date: 08/05/2023  History of Present Illness  72 y.o. male who presented to the emergency department From [facility] nursing facility with mild MS change and fever and admitted 08/04/23 for systemic inflammatory response syndrome. Past medical history significant of multiple myeloma not in remission, thrombocytopenia, anemia, chemotherapy-induced leukopenia, osteoarthritis, asthma, depression, diplopia, duodenal ulcer, GERD, lower GI bleed, hyperlipidemia, hypertension, hypothyroidism, transient ischemic attack, transient global amnesia  Clinical Impression  Pt admitted with above diagnosis.  Pt currently with functional limitations due to the deficits listed below (see PT Problem List). Pt will benefit from acute skilled PT to increase their independence and safety with mobility to allow discharge.  Pt reports feeling tired but agreeable to mobilize.  Pt reports he was in rehab at [facility], per chart review, appears pt may now be residing in SNF section.  Pt required assist for bed mobility and standing however movement improved once ambulating.  Will defer rehab portion to facility however will continue to safely assist pt with mobilizing during hospitalization.  Pt reported he did not wear oxygen at baseline.  SPO2 dropped to 83% on room air at rest so performed ambulation on 3L O2.  SpO2 95-100% on 3L O2 [place] upon returning to room.  Pt also with epistaxis upon sitting in recliner end of session, RN notified.         If plan is discharge home, recommend the following: A lot of help with walking and/or transfers;A lot of help with bathing/dressing/bathroom   Can travel by private vehicle        Equipment Recommendations None recommended by PT  Recommendations for Other Services       Functional Status Assessment Patient has had a recent decline in their functional status and  demonstrates the ability to make significant improvements in function in a reasonable and predictable amount of time.     Precautions / Restrictions Precautions Precautions: Fall Precaution Comments: 3L O2 baseline? Restrictions Weight Bearing Restrictions: No      Mobility  Bed Mobility Overal bed mobility: Needs Assistance Bed Mobility: Supine to Sit     Supine to sit: Mod assist     General bed mobility comments: pt provided with multimodal cues and time however difficulty processing getting over to EOB, provided assist for trunk upright    Transfers Overall transfer level: Needs assistance Equipment used: Rolling walker (2 wheels) Transfers: Sit to/from Stand Sit to Stand: Mod assist           General transfer comment: assist to rise and steady due to posterior lean    Ambulation/Gait Ambulation/Gait assistance: Min assist, Contact guard assist Gait Distance (Feet): 120 Feet Assistive device: Rolling walker (2 wheels) Gait Pattern/deviations: Step-through pattern, Decreased stride length, Shuffle       General Gait Details: slight steppage gait observed with Lt LE, pt drifting right and requiring verbal cues for avoid objects; assist for stability initially however this improved with distance, then assist for maneuvering RW at times to avoid obstacles, SpO2 95-100% on 3L O2 [place] upon returning to room  Stairs            Wheelchair Mobility     Tilt Bed    Modified Rankin (Stroke Patients Only)       Balance Overall balance assessment: History of Falls, Needs assistance         Standing balance support: Bilateral upper extremity supported, During functional activity, Reliant on  assistive device for balance Standing balance-Leahy Scale: Zero Standing balance comment: posterior lean upon standing requiring assist                             Pertinent Vitals/Pain Pain Assessment Pain Assessment: No/denies pain    Home Living  Family/patient expects to be discharged to:: Skilled nursing facility                   Additional Comments: Sept and October admission d/c to rehab at [facility] (previously in ALF, now currently SNF section?)    Prior Function Prior Level of Function : Needs assist;History of Falls (last six months)             Mobility Comments: Pt reported use of a RW for ambulation.  Reports he is from rehab at [facility]       Extremity/Trunk Assessment        Lower Extremity Assessment Lower Extremity Assessment: Generalized weakness       Communication   Communication Communication: Difficulty following commands/understanding Following commands: Follows one step commands with increased time;Follows one step commands inconsistently Cueing Techniques: Verbal cues;Gestural cues;Visual cues  Cognition Arousal: Alert Behavior During Therapy: WFL for tasks assessed/performed Overall Cognitive Status: No family/caregiver present to determine baseline cognitive functioning                                 General Comments: slow processing, requiring frequent cues to remain on task and for technique        General Comments      Exercises     Assessment/Plan    PT Assessment Patient needs continued PT services  PT Problem List Decreased strength;Decreased activity tolerance;Decreased balance;Decreased mobility;Decreased knowledge of use of DME;Decreased safety awareness;Decreased cognition       PT Treatment Interventions DME instruction;Gait training;Balance training;Functional mobility training;Therapeutic activities;Therapeutic exercise;Patient/family education    PT Goals (Current goals can be found in the Care Plan section)  Acute Rehab PT Goals PT Goal Formulation: With patient Time For Goal Achievement: 08/19/23 Potential to Achieve Goals: Good    Frequency Min 1X/week     Co-evaluation               AM-PAC PT "6 Clicks" Mobility   Outcome Measure Help needed turning from your back to your side while in a flat bed without using bedrails?: A Lot Help needed moving from lying on your back to sitting on the side of a flat bed without using bedrails?: A Lot Help needed moving to and from a bed to a chair (including a wheelchair)?: A Lot Help needed standing up from a chair using your arms (e.g., wheelchair or bedside chair)?: A Lot Help needed to walk in hospital room?: A Little Help needed climbing 3-5 steps with a railing? : A Lot 6 Click Score: 13    End of Session Equipment Utilized During Treatment: Gait belt;Oxygen Activity Tolerance: Patient tolerated treatment well Patient left: in chair;with call bell/phone within reach;with chair alarm set Nurse Communication: Mobility status PT Visit Diagnosis: Other abnormalities of gait and mobility (R26.89)    Time: 1610-9604 PT Time Calculation (min) (ACUTE ONLY): 19 min   Charges:   PT Evaluation $PT Eval Low Complexity: 1 Low   PT General Charges $$ ACUTE PT VISIT: 1 Visit        Thomasene Mohair PT, DPT Physical Therapist  Acute Rehabilitation Services Office: [phone]   Carlyon Prows 08/05/2023, 12:53 PM

## 2023-08-05 NOTE — Hospital Course (Addendum)
72 y.o. M with MM s/p CAR-T therapy Jan 2024 with pancytopenia and cognitive impairment, HTN, HLD, hypothyroidism and cognitive impairment, presented with increased confusion and fever.   Significant events: 8/17-23: Admited for large aspiration requiring intubation, PsA bacteremia  9/1-6: Readmitted for hypoxia/confusion, found to have PsA UTI  9/23-10/2: Readmitted for positive blood culture at SNF, hypotension; culture ultimately turned out to be contaminant, was treated with transfusions and discharged back to SNF

## 2023-08-05 NOTE — Assessment & Plan Note (Addendum)
Per Oncology, relapsed/refractory IgG lambda MM s/p 8th line therapy with carfilzomib, cyclophosphamide and dexamethasone followed by CAR T cell therapy with Cilta-cel on 09/26/2022 with good partial response. Subsequently, protracted cytopenias and global functional and cognitive decline (former due to exhausted marrow reserves from extensive prior therapy for MM and latter due to chronic ischemic changes as well as movement and cognitive toxicities (MNTs) of CAR T cell therapy).   On weekly Nplate, monthly IVIG, daily dapsone and Valtrex - Continue dapsone, Valtrex

## 2023-08-05 NOTE — Progress Notes (Addendum)
Progress Note   Patient: Andre [name] WUJ:[id number] DOB: 07/17/1951 DOA: 08/04/2023     1 DOS: the patient was seen and examined on 08/05/2023 at 8:50 AM      Brief hospital course: 72 y.o. M with MM s/p CAR-T therapy Jan 2024 with pancytopenia and cognitive impairment, HTN, HLD, hypothyroidism and cognitive impairment, presented with increased confusion and fever.   Significant events: 8/17-23: Admited for large aspiration requiring intubation, PsA bacteremia  9/1-6: Readmitted for hypoxia/confusion, found to have PsA UTI  9/23-10/2: Readmitted for positive blood culture at SNF, hypotension; culture ultimately turned out to be contaminant, was treated with transfusions and discharged back to SNF          Assessment and Plan: * SIRS (systemic inflammatory response syndrome) (HCC) There was initial misunderstanding that the patient is on active chemo (he is not, he gets weekly Nplate and monthyl IVIG and the infusion he had this week was of IVIG).  He is relatively immune suppressed and so admission for IV antibiotics is not completely unreasonable.  UA normal, RVP and COVID/Flu/RSV negative.  CXR equivocal. - Obtain CT chest - Continue empiric antibiotics for 48 hours - Follow blood cultures    Pancytopenia (HCC) Thrombocytopenia Macrocytic anemia Counts at baseline, no bleeding, no neutropenia. - Continue iron, folate - Trend CBC  Mild cognitive impairment Encephalopathy ruled out.  Patient has some degree of cognitive impairment due to his vascular disease and CART therapy, but is not appreciably different from recent baseline. - Continue home Ritaline, Lexprao - Standard delirium precautions: blinds open and lights on during day, TV off, minimize interruptions at night, glasses/hearing aids, PT/OT, avoiding Beers list medications    Hyponatremia Normalized - Hold furhter fluids  Multiple myeloma not having achieved remission (HCC) Per Oncology,  relapsed/refractory IgG lambda MM s/p 8th line therapy with carfilzomib, cyclophosphamide and dexamethasone followed by CAR T cell therapy with Cilta-cel on 09/26/2022 with good partial response. Subsequently, protracted cytopenias and global functional and cognitive decline (former due to exhausted marrow reserves from extensive prior therapy for MM and latter due to chronic ischemic changes as well as movement and cognitive toxicities (MNTs) of CAR T cell therapy).   On weekly Nplate, monthly IVIG, daily dapsone and Valtrex - Continue dapsone, Valtrex  Hypothyroidism - Continue levothyroxine  Hyperlipidemia            Subjective: Patient reports that he feels "fine", and at baseline.  However he was observed to be coughing frequently during my exam.  Family at the bedside note no new concerns, states that he appears about like yesterday.  Nursing of no concerns.  No further fever since admission.  No respiratory distress, complaints.     Physical Exam: BP 108/68 (BP Location: Left Arm)   Pulse 99   Temp 98.4 F (36.9 C) (Oral)   Resp 20   Ht 5\' 7"  (1.702 m)   Wt 82.6 kg   SpO2 91%   BMI 28.52 kg/m   Frail elderly adult male, lying in bed, appears debilitated Tachycardic, regular, no murmurs, no peripheral edema Respiratory rate are normal, lung sounds coarse bilaterally but no focal rales or wheezes Abdomen soft without grimace to palpation, no tenderness or guarding in all quadrants Mostly inattentive, makes eye contact and answers orientation questions, but not oriented to month, and states that this is "[facility]ge Gainer", daughter states that this is about his baseline, he has generalized weakness but symmetric strength, face symmetric, speech fluent  Data Reviewed: Basic metabolic panel shows sodium normalized, creatinine potassium normal Urinalysis shows no cells Respiratory virus panel negative CBC shows thrombocytopenia, leukopenia, and anemia, all of which are  stable Chest x-ray shows a retrocardiac opacity, nondiagnostic Outside records reviewed and summarized above   Family Communication: Daughter at the bedside    Disposition: Status is: Inpatient         Author: Alberteen Sam, MD 08/05/2023 1:09 PM  For on call review [email or web address].

## 2023-08-05 NOTE — Assessment & Plan Note (Signed)
There was initial misunderstanding that the patient is on active chemo (he is not, he gets weekly Nplate and monthyl IVIG and the infusion he had this week was of IVIG).  He is relatively immune suppressed and so admission for IV antibiotics is not completely unreasonable.  UA normal, RVP and COVID/Flu/RSV negative.  CXR equivocal. - Obtain CT chest - Continue empiric antibiotics for 48 hours - Follow blood cultures

## 2023-08-05 NOTE — Assessment & Plan Note (Signed)
Continue levothyroxine 

## 2023-08-05 NOTE — Assessment & Plan Note (Addendum)
Encephalopathy ruled out.  Patient has some degree of cognitive impairment due to his vascular disease and CART therapy, but is not appreciably different from recent baseline. - Continue home Ritaline, Lexprao - Standard delirium precautions: blinds open and lights on during day, TV off, minimize interruptions at night, glasses/hearing aids, PT/OT, avoiding Beers list medications

## 2023-08-05 NOTE — Assessment & Plan Note (Signed)
Normalized - Hold furhter fluids

## 2023-08-05 NOTE — Plan of Care (Signed)
  Problem: Safety: Goal: Ability to remain free from injury will improve Outcome: Progressing   Problem: Skin Integrity: Goal: Risk for impaired skin integrity will decrease Outcome: Progressing   Problem: Pain Management: Goal: General experience of comfort will improve Outcome: Progressing   Problem: Coping: Goal: Level of anxiety will decrease Outcome: Progressing   Problem: Clinical Measurements: Goal: Ability to maintain clinical measurements within normal limits will improve Outcome: Progressing

## 2023-08-06 ENCOUNTER — Inpatient Hospital Stay (HOSPITAL_COMMUNITY): Payer: Medicare Other

## 2023-08-06 DIAGNOSIS — R6510 Systemic inflammatory response syndrome (SIRS) of non-infectious origin without acute organ dysfunction: Secondary | ICD-10-CM | POA: Diagnosis not present

## 2023-08-06 LAB — CBC WITH DIFFERENTIAL/PLATELET
Abs Immature Granulocytes: 0.27 10*3/uL — ABNORMAL HIGH (ref 0.00–0.07)
Basophils Absolute: 0 10*3/uL (ref 0.0–0.1)
Basophils Relative: 1 %
Eosinophils Absolute: 0.1 10*3/uL (ref 0.0–0.5)
Eosinophils Relative: 3 %
HCT: 26.2 % — ABNORMAL LOW (ref 39.0–52.0)
Hemoglobin: 8.5 g/dL — ABNORMAL LOW (ref 13.0–17.0)
Immature Granulocytes: 8 %
Lymphocytes Relative: 6 %
Lymphs Abs: 0.2 10*3/uL — ABNORMAL LOW (ref 0.7–4.0)
MCH: 37.9 pg — ABNORMAL HIGH (ref 26.0–34.0)
MCHC: 32.4 g/dL (ref 30.0–36.0)
MCV: 117 fL — ABNORMAL HIGH (ref 80.0–100.0)
Monocytes Absolute: 0.6 10*3/uL (ref 0.1–1.0)
Monocytes Relative: 19 %
Neutro Abs: 2 10*3/uL (ref 1.7–7.7)
Neutrophils Relative %: 63 %
Platelets: 22 10*3/uL — CL (ref 150–400)
RBC: 2.24 MIL/uL — ABNORMAL LOW (ref 4.22–5.81)
RDW: 15.6 % — ABNORMAL HIGH (ref 11.5–15.5)
WBC: 3.2 10*3/uL — ABNORMAL LOW (ref 4.0–10.5)
nRBC: 0 % (ref 0.0–0.2)

## 2023-08-06 MED ORDER — POLYVINYL ALCOHOL 1.4 % OP SOLN
1.0000 [drp] | OPHTHALMIC | Status: DC | PRN
  Filled 2023-08-06: qty 15

## 2023-08-06 NOTE — NC FL2 (Signed)
MEDICAID FL2 LEVEL OF CARE FORM     IDENTIFICATION  Patient Name: Andre [name] Birthdate: 1950-11-05 Sex: male Admission Date (Current Location): 08/04/2023  [facility] and [place] Number:  Producer, television/film/video and Address:  [facility],  [address] [place], [place] [address]      Provider Number: [id number]  Attending Physician Name and Address:  Alberteen Sam, *  Relative Name and Phone Number:  [name] Szatkowski(spouse)[phone]    Current Level of Care: Hospital Recommended Level of Care: Skilled Nursing Facility Prior Approval Number:    Date Approved/Denied:   PASRR Number: [id number]  Discharge Plan: SNF    Current Diagnoses: Patient Active Problem List   Diagnosis Date Noted   SIRS (systemic inflammatory response syndrome) (HCC) 08/04/2023   Oral thrush 06/19/2023   Candidiasis of mouth 06/19/2023   Multiple rib fractures 06/16/2023   Hypotension 06/15/2023   Positive blood culture 06/15/2023   Anemia associated with multiple myeloma treated with erythropoietin (HCC) 06/14/2023   DNR (do not resuscitate)/DNI(Do Not Intubate) 05/20/2023   Severe sepsis (HCC) 05/20/2023   Acute cystitis without hematuria 05/20/2023   Moderate-to-severe aortic regurgitation 05/20/2023   Macrocytic anemia 05/20/2023   Acute hypoxemic respiratory failure (HCC) 05/05/2023   Reduced mobility 10/13/2022   Self-care deficit 10/13/2022   Physical deconditioning 10/02/2022   History of engineered cell therapy infusion 09/26/2022   Meteorism 02/09/2022   Mild cognitive impairment 08/30/2021   Hemorrhoids 11/20/2019   Neuropathy due to chemotherapeutic drug (HCC) 09/01/2019   Dry skin dermatitis 08/16/2016   Peripheral edema 04/26/2016   Bone marrow transplant status (HCC) 03/14/2016   Debility 02/26/2016   Back pain 02/26/2016   Patient in clinical research study 01/10/2016   GERD (gastroesophageal reflux disease)    Weakness 12/22/2015    Hyponatremia 12/22/2015   General weakness    Multiple myeloma not having achieved remission (HCC)    Chronic anemia    Constipation    Thrombocytopenia (HCC) 11/05/2015   Actinic keratosis 07/08/2015   Raynaud's phenomenon 09/17/2013   Cough 01/10/2013   Vitamin D deficiency 09/30/2012   Hypothyroidism 09/30/2012   Hypogonadism male 09/30/2012   Fatigue 09/27/2012   Insomnia 09/27/2012   Reactive depression (situational) 09/27/2012   Pancytopenia (HCC) 03/27/2012   Hyperlipidemia 05/30/2010   Anemia of chronic disease 05/30/2010   Asthma 05/30/2010   SCHATZKI'S RING 05/30/2010   Peptic ulcer disease 05/30/2010   Diaphragmatic hernia 05/30/2010   Sleep apnea 05/30/2010   TRANSAMINASES, SERUM, ELEVATED 05/30/2010    Orientation RESPIRATION BLADDER Height & Weight     Self, Time, Situation, Place  Normal Continent Weight: 82.6 kg Height:  5\' 7"  (170.2 cm)  BEHAVIORAL SYMPTOMS/MOOD NEUROLOGICAL BOWEL NUTRITION STATUS      Continent Diet (Heart Healthy)  AMBULATORY STATUS COMMUNICATION OF NEEDS Skin   Limited Assist Verbally Normal                       Personal Care Assistance Level of Assistance  Bathing, Feeding, Dressing Bathing Assistance: Limited assistance Feeding assistance: Limited assistance Dressing Assistance: Limited assistance     Functional Limitations Info  Sight, Hearing, Speech Sight Info: Impaired (readers) Hearing Info: Adequate Speech Info: Adequate    SPECIAL CARE FACTORS FREQUENCY  PT (By licensed PT), OT (By licensed OT)     PT Frequency: 5x week OT Frequency: 5x week            Contractures Contractures Info: Not  present    Additional Factors Info  Code Status, Allergies Code Status Info: DNR Allergies Info: Atorvastatin, Rosuvastatin, Beef-derived Products, Chicken Protein, Pork-derived Products, Septra (Sulfamethoxazole-trimethoprim           Current Medications (08/06/2023):  This is the current hospital active  medication list Current Facility-Administered Medications  Medication Dose Route Frequency Provider Last Rate Last Admin   acetaminophen (TYLENOL) tablet 650 mg  650 mg Oral Q6H PRN Bobette Mo, MD   650 mg at 08/06/23 1039   Or   acetaminophen (TYLENOL) suppository 650 mg  650 mg Rectal Q6H PRN Bobette Mo, MD       calcium carbonate (TUMS - dosed in mg elemental calcium) chewable tablet 400 mg of elemental calcium  2 tablet Oral TID PRN Bobette Mo, MD       cyanocobalamin (VITAMIN B12) tablet 1,000 mcg  1,000 mcg Oral Daily Bobette Mo, MD   1,000 mcg at 08/06/23 2956   dapsone tablet 100 mg  100 mg Oral Daily Bobette Mo, MD   100 mg at 08/06/23 1039   escitalopram (LEXAPRO) tablet 10 mg  10 mg Oral Daily Bobette Mo, MD   10 mg at 08/06/23 2130   ferrous sulfate tablet 325 mg  325 mg Oral BID AC Bobette Mo, MD   325 mg at 08/06/23 8657   folic acid (FOLVITE) tablet 1 mg  1 mg Oral Daily Bobette Mo, MD   1 mg at 08/06/23 0854   levothyroxine (SYNTHROID) tablet 125 mcg  125 mcg Oral Q0600 Bobette Mo, MD   125 mcg at 08/06/23 8469   methylphenidate (RITALIN) tablet 2.5 mg  2.5 mg Oral q morning Bobette Mo, MD   2.5 mg at 08/06/23 6295   Muscle Rub CREA 1 Application  1 Application Topical PRN Bobette Mo, MD       ondansetron [facility]) tablet 4 mg  4 mg Oral Q6H PRN Bobette Mo, MD       Or   ondansetron [facility]) injection 4 mg  4 mg Intravenous Q6H PRN Bobette Mo, MD       Oral care mouth rinse  15 mL Mouth Rinse PRN Bobette Mo, MD       pantoprazole (PROTONIX) EC tablet 40 mg  40 mg Oral Daily Bobette Mo, MD   40 mg at 08/06/23 0854   sodium chloride flush (NS) 0.9 % injection 10 mL  10 mL Intravenous Q12H Bobette Mo, MD   10 mL at 08/06/23 0854   tamsulosin (FLOMAX) capsule 0.4 mg  0.4 mg Oral Daily Bobette Mo, MD   0.4 mg at 08/06/23 0854    traMADol (ULTRAM) tablet 50 mg  50 mg Oral Q12H PRN Alberteen Sam, MD   50 mg at 08/05/23 1449   valACYclovir (VALTREX) tablet 500 mg  500 mg Oral Daily Bobette Mo, MD   500 mg at 08/06/23 2841     Discharge Medications: Please see discharge summary for a list of discharge medications.  Relevant Imaging Results:  Relevant Lab Results:   Additional Information SSN: [id number]  Lanier Clam, RN

## 2023-08-06 NOTE — Progress Notes (Deleted)
Daughter, Gigi Gin, called to get an update.

## 2023-08-06 NOTE — Progress Notes (Signed)
  Progress Note   Patient: Andre [name] [id number]:[id number] DOB: 13-Jun-1951 DOA: 08/04/2023     2 DOS: the patient was seen and examined on 08/06/2023 at 10:45AM and 4:00PM      Brief hospital course: 72 y.o. M with MM s/p CAR-T therapy Jan 2024 with pancytopenia and cognitive impairment, HTN, HLD, hypothyroidism and cognitive impairment, presented with increased confusion and fever.       Assessment and Plan: *Fever Jaw pain Sepsis ruled out.  Unclear source of where the fever came from.  UA normal, respiratory virus panel is negative, chest x-ray equivocal but CT chest ruled out pneumonia.  He had jaw pain today, the tympanic membrane showed no effusions, want to rule out osteonecrosis of the jaw. - Obtain MRI jaw - Stop antibiotics - Monitor fever curve for the next 24 hours off antibiotics   - Follow blood cultures    Pancytopenia (HCC) Thrombocytopenia Macrocytic anemia Counts stable - Continue iron, folate  Mild cognitive impairment Encephalopathy ruled out.  -Continue Ritalin, Lexapro   Multiple myeloma  - Continue dapsone, Valtrex  Hypothyroidism - Continue levothyroxine  Hyperlipidemia   Respiratory failure ruled out          Subjective: Patient has severe jaw pain this morning.  No cough, no further fever, no abdominal pain.  Generally feels well, looking at his phone.     Physical Exam: BP 126/62 (BP Location: Left Arm)   Pulse 67   Temp 98.2 F (36.8 C) (Oral)   Resp 20   Ht 5\' 7"  (1.702 m)   Wt 82.6 kg   SpO2 92%   BMI 28.52 kg/m   Elderly adult male, appears debilitated, lying in bed, interactive and appropriate RRR, no murmurs, no peripheral edema Respiratory rate normal, lungs clear without rales or wheezes Abdomen soft without tenderness palpation or guarding Attention normal, affect pleasant, judgment and insight appear impaired by dementia, face symmetric, speech fluent, moves all extremities with generalized weakness but  symmetric strength    Data Reviewed: CBC shows stable pancytopenia Respiratory virus panel negative Chest CT negative MRI jaw pending  Family Communication: Daughter by phone    Disposition: Status is: Inpatient Will monitor the patient 24 hours of antibiotics, if he remains asymptomatic and afebrile, likely home tomorrow morning       Author: Alberteen Sam, MD 08/06/2023 5:14 PM  For on call review [email or web address].

## 2023-08-06 NOTE — TOC Transition Note (Addendum)
Transition of Care [facility]) - CM/SW Discharge Note   Patient Details  Name: Andre [name] MRN: [id number] Date of Birth: 03-Apr-1951  Transition of Care [facility]) CM/SW Contact:  Lanier Clam, RN Phone Number: 08/06/2023, 12:14 PM   Clinical Narrative: for d/c back to [facility] per spouse [name]-rep [name] @ [facility] accepted back once ready. DNR.PTAR @ d/c.   -3:30p-left vm w/[facility] rep [name] for time of how late can d/c summary be sent & patient can return. Will have d/c packet @ nsg station in case able to return today. -4p- Plan for d/c early am.    Final next level of care: Skilled Nursing Facility Barriers to Discharge: No Barriers Identified   Patient Goals and CMS Choice CMS Medicare.gov Compare Post Acute Care list provided to:: Patient Represenative (must comment) ([name](spouse)) Choice offered to / list presented to : Spouse  Discharge Placement                         Discharge Plan and Services Additional resources added to the After Visit Summary for     Discharge Planning Services: CM Consult Post Acute Care Choice: Skilled Nursing Facility                               Social Determinants of Health (SDOH) Interventions SDOH Screenings   Food Insecurity: No Food Insecurity (08/04/2023)  Housing: Low Risk  (08/04/2023)  Transportation Needs: No Transportation Needs (08/04/2023)  Utilities: Not At Risk (08/04/2023)  Depression (PHQ2-9): Low Risk  (06/08/2022)  Tobacco Use: Medium Risk (08/04/2023)     Readmission Risk Interventions    06/20/2023   12:59 PM  Readmission Risk Prevention Plan  Transportation Screening Complete  PCP or Specialist Appt within 3-5 Days Complete  HRI or Home Care Consult Complete  Social Work Consult for Recovery Care Planning/Counseling Complete  Palliative Care Screening Not Applicable  Medication Review Oceanographer) Complete

## 2023-08-06 NOTE — Progress Notes (Addendum)
IP PROGRESS NOTE  Subjective:   Andre [name] is well-known to me with a history of multiple myeloma, status post CAR-T therapy earlier this year.  He has persistent pancytopenia following CAR-T therapy.  He has low immunoglobulin levels and receives monthly IVIG prophylaxis.  He is currently maintained on weekly Nplate with a platelet count ranging between 20,000 and 30,000.  He was admitted 08/04/2023 with a fever.  Source for infection has been identified.  He has no complaint today.  He wants to be [facility].  Objective: Vital signs in last 24 hours: Blood pressure (!) 128/59, pulse 61, temperature 98.4 F (36.9 C), temperature source Oral, resp. rate 12, height 5\' 7"  (1.702 m), weight 182 lb 1.6 oz (82.6 kg), SpO2 94%.  Intake/Output from previous day: 11/17 0701 - 11/18 0700 In: 460 [P.O.:240; I.V.:20; IV Piggyback:200] Out: 650 [Urine:650]  Physical Exam:  HEENT: No thrush Lungs: Clear bilaterally Cardiac: Regular rate and rhythm Abdomen: Nontender, no hepatosplenomegaly Extremities: No leg edema Neurologic: Alert, oriented to place and year    Lab Results: Recent Labs    08/05/23 0532 08/06/23 0453  WBC 3.6* 3.2*  HGB 9.2* 8.5*  HCT 29.4* 26.2*  PLT 24* 22*    BMET Recent Labs    08/04/23 1115 08/05/23 0532  NA 129* 137  K 3.8 4.3  CL 100 104  CO2 21* 25  GLUCOSE 95 94  BUN 14 14  CREATININE 0.78 0.80  CALCIUM 8.5* 9.1    No results found for: "CEA1", "CEA", "ZOX096", "CA125"  Studies/Results: CT CHEST WO CONTRAST  Result Date: 08/05/2023 CLINICAL DATA:  Respiratory illness. EXAM: CT CHEST WITHOUT CONTRAST TECHNIQUE: Multidetector CT imaging of the chest was performed following the standard protocol without IV contrast. RADIATION DOSE REDUCTION: This exam was performed according to the departmental dose-optimization program which includes automated exposure control, adjustment of the mA and/or kV according to patient size and/or  use of iterative reconstruction technique. COMPARISON:  May 20, 2023 FINDINGS: Cardiovascular: Calcific atherosclerotic disease of the coronary arteries. Normal heart size. No pericardial effusion. Mediastinum/Nodes: No enlarged mediastinal or axillary lymph nodes. Thyroid gland, trachea, and esophagus demonstrate no significant findings. Lungs/Pleura: Streaky airspace opacities in the right more the left lung base with mild pleural thickening. These findings are not significantly changed from September 2024. Upper Abdomen: Cholelithiasis. Large hiatal hernia containing most of the gastric body. Musculoskeletal: Healed bilateral rib fractures. Chronic mild T12 compression fracture. IMPRESSION: 1. Streaky airspace opacities in the right more the left lung base with mild pleural thickening. These findings are not significantly changed from September 2024. 2. Calcific atherosclerotic disease of the coronary arteries. 3. Cholelithiasis. 4. Large hiatal hernia containing most of the gastric body. 5. Chronic mild T12 compression fracture. 6. Healed bilateral rib fractures. 7. Aortic atherosclerosis. Aortic Atherosclerosis (ICD10-I70.0). Electronically Signed   By: Ted Mcalpine M.D.   On: 08/05/2023 19:36   DG Chest Port 1 View  Result Date: 08/04/2023 CLINICAL DATA:  History of multiple myeloma with sepsis EXAM: PORTABLE CHEST 1 VIEW COMPARISON:  Chest radiograph dated 06/20/2023 FINDINGS: Unchanged elevation of the right hemidiaphragm. Normal lung volumes. Heterogeneous left retrocardiac opacity. No pleural effusion or pneumothorax. The heart size and mediastinal contours are within normal limits. No acute osseous abnormality. IMPRESSION: 1. Heterogeneous left retrocardiac opacity, likely reflecting known hernia. 2.  No new focal consolidations. Electronically Signed   By: Agustin Cree M.D.   On: 08/04/2023 12:01    Medications: I have reviewed the  patient's current medications.  AMultiple myeloma- IgG  lambda  bortezomib (subcutaneously), lenalidomide, and dexamethasone, with repeat bone marrow biopsy May of 2012 showing 10% plasmacytosis   (2) high-dose chemotherapy with BCNU and melphalan at [facility], followed by stem cell rescue July of 2012   (3) on zoledronic acid started December of 2012, initially monthly, currently given every 3 months, most recent dose  12/07/2015   (4) low-dose lenalidomide resumed April 2013, interrupted several times.  Resumed again on 02/19/2013, ata dose of 5 mg daily, 21 days on and 7 days off, later further reduced to 7 days on, 7 days off   (5) CNS symptoms and abnormal brain MRI extensively evaluated by neurology with no definitive diagnosis established   (6) rising M spike noted June 2014 but did not meet criteria for carfilzomib study   (7) on lenalidomide 25 mg daily, 14 days on, 7 days off, starting 04/18/2013, interrupted December 2014 because of rash;             (a) resumed January 2015 at 10 mg/ day at 21 days on/ 7 days off             (b) starting 08/18/2014 decreased to 10 mg/ day 14 days on, 7 days off because of cytopenias             (c) as of February 2016 was on 5 mg daily 7 days on 7 days off             (d) lenalidomide discontinued December 2016 with evidence of disease progression   (8) transient global amnesia 05/29/2015, resolved without intervention   (9) starting PVD 10/25/2015 w ASA 325 thromboprophylaxis, valacyclovir prophylaxis, last dose 12/17/2015             (a) pomalidomide 4 mg/d days 1-14             (b) bortezomib sQ days 2,5,9,12 of each 21 day cycle             (c) dexamethasome 20 mg two days a week             (d) dexamethasone bortezomib and pomalidomide discontinued late December 2018 with poor tolerance   (10) metapneumovirus pneumonia April 2017             (a) completing course of steroids and week of bactrim mid April 2017   (11) status post second autologous transplant at [facility] 02/04/2016(preparatory regimen  melphalan 200 mg/m)             (a) received twelve-month vaccinations 03/14/2017 (DPT, Haemophilus, Pneumovax 13, polio)             (b) 14 months injections 05/04/2016 include DTaP, Hib conjugate, HepB energex B 20 mcg/ml, Prevnar 13             (c) 24 month vaccines due at [facility] June 2019   (12) maintenance therapy started November 2017, consisting of             (a) bortezomib 1.3 mg/M2 every 14 days, first dose 07/27/2016             (b) pomalidomide 1 mg days 1-21 Q28 days, started 07/19/2016             (c) zolendronate monthly started 07/27/2016 (previously Q 3 months) however patient unable to tolerate, and changed back to q3 months in April, 2018             (d) Bortezomib changed  to monthly as of June 2018 because of tolerance issues, however discontinued after 09/14/2017 dose because of a rise in his M spike             (e) pomalidomide held after 10/18/2017 in preparation for possible study at [facility] (venetoclax)--never resumed             (f) with numbers actually improved off treatment, resumed every 2-week bortezomib 12/25/2017             (g) changed to every 3-week bortezomib as of 03/17/2019             (h) briefly on weekly treatments times 22 October 2020 due to increase in M spike             (I) maintenance therapy discontinued with evidence of progression   (13) bortezomib/daratumumab/dexamethasone started 12/20/2020.             (a) Decadron dose dropped to 10 mg day of and day following treatment starting May 2022             (b) day 8 bortezomib omitted beginning with the June cycle per patient preference             (C) treatment changed to monthly daratumumab/Velcade/Decadron beginning 06/27/2021-patient decision (14) weekly Velcade/Cytoxan beginning 11/14/2021 (15) changed to Cytoxan/carfilzomib/Decadron 12/19/2021-treatment placed on hold 05/15/2022, carfilzomib 05/24/2022 and 05/31/2022 16.  Leukocyte a pheresis procedure at [facility] 06/15/2022 17.  Treatment  resumed with Cytoxan/carfilzomib/Decadron 06/28/2022, last given 09/06/2022 18.  CAR-T 09/26/2022 with Carvykti 19.  CRS and ICANS following CAR-T therapy, status post a Decadron taper completed 10/08/2022 20.  Presentation to the emergency room 10/30/2022 with failure to thrive and a fall  21.  Pancytopenia secondary to multiple myeloma and CAR-T therapy-improved 22.  Bone marrow biopsy 11/13/2022-mildly hypocellular bone marrow with relative erythroid hyperplasia and decreased megakaryocytes; no plasma cells identified by differential count or CD138 immunohistochemical stain; flow cytometry negative for a clonal plasma cell population.   bone marrow biopsy 05/10/2023-mildly hypercellular marrow with mild decrease in megakaryocytes, plasma cells not increased, absent iron stores, 46 XY, negative myeloma FISH panel, negative myeloid panel 23.  Cognitive impairment predating CAR-T therapy and worsened following CAR-T therapy 24.  Admission 05/05/2023 with Pseudomonas sepsis CTs chest, abdomen, and pelvis-right lung base atelectasis, no infiltrate 25.  Admission 05/20/2023 with hypoxia, altered mental status, and fever Infectious disease evaluation on hospital admission consistent with a urinary tract infection, urine culture positive for Pseudomonas aeruginosa 26.  Hypogammaglobulinemia secondary to multiple myeloma and CAR-T therapy Monthly IVIG 27.  Admission 06/14/2023 with severe anemia, hypotension, and report of a positive blood culture from 06/13/2023?  With gram-positive cocci in clusters, recent fall with a plain x-ray 06/14/2023 confirming left sixth and seventh rib fractures 28.  Thrombocytopenia-possibly bone marrow failure from extensive course of treatment, possible treatment related MDS, immune thrombocytopenia, atypical infection 06/20/2023 CMV and parvovirus negative  weekly Nplate beginning 06/20/2023 Nplate dose escalated per protocol 07/06/2023 29.  Admission 08/04/2023 with a fever   Mr.  Andre [name] has a history of multiple myeloma, currently maintained off of specific therapy.  He is immunocompromise secondary to myeloma diagnosis and CAR-T therapy given earlier this year.  He has persistent pancytopenia.  He receives monthly IVIG at the cancer center and weekly Nplate for chronic thrombocytopenia.  He was admitted 08/04/2023 with a fever.  No source for infection has been identified.  Cultures are negative to date.  He has remained afebrile  over the past 24 hours.  The etiology of the fever is unclear.  He appears at baseline with regard to his mental status.  Recommendations: Discharge when stable per the medical service 2.   Follow-up as scheduled with the Cancer center 08/10/2023 3.   Continue dapsone and Valacyclovir prophylaxis   LOS: 2 days   Thornton Papas, MD   08/06/2023, 7:15 AM

## 2023-08-06 NOTE — Plan of Care (Signed)
  Problem: Safety: Goal: Ability to remain free from injury will improve Outcome: Progressing   Problem: Skin Integrity: Goal: Risk for impaired skin integrity will decrease Outcome: Progressing   Problem: Coping: Goal: Level of anxiety will decrease Outcome: Progressing   Problem: Nutrition: Goal: Adequate nutrition will be maintained Outcome: Progressing   Problem: Clinical Measurements: Goal: Ability to maintain clinical measurements within normal limits will improve Outcome: Progressing

## 2023-08-07 ENCOUNTER — Other Ambulatory Visit: Payer: Self-pay | Admitting: *Deleted

## 2023-08-07 DIAGNOSIS — D696 Thrombocytopenia, unspecified: Secondary | ICD-10-CM

## 2023-08-07 DIAGNOSIS — R6510 Systemic inflammatory response syndrome (SIRS) of non-infectious origin without acute organ dysfunction: Secondary | ICD-10-CM | POA: Diagnosis not present

## 2023-08-07 NOTE — TOC Transition Note (Signed)
Transition of Care [facility]) - CM/SW Discharge Note   Patient Details  Name: Andre [name] MRN: [id number] Date of Birth: 16-Jun-1951  Transition of Care [facility]) CM/SW Contact:  Lanier Clam, RN Phone Number: 08/07/2023, 10:02 AM   Clinical Narrative:  d/c today back to [facility] rep [name] aware going to rm#[id number];report tel#[phone]. PTAR called No further CM needs.     Final next level of care: Skilled Nursing Facility Barriers to Discharge: No Barriers Identified   Patient Goals and CMS Choice CMS Medicare.gov Compare Post Acute Care list provided to:: Patient Represenative (must comment) ([name](spouse)) Choice offered to / list presented to : Spouse  Discharge Placement                Patient chooses bed at: [name]) Patient to be transferred to facility by: PTAR Name of family member notified: [name] Patient and family notified of of transfer: 08/07/23  Discharge Plan and Services Additional resources added to the After Visit Summary for     Discharge Planning Services: CM Consult Post Acute Care Choice: Skilled Nursing Facility                               Social Determinants of Health (SDOH) Interventions SDOH Screenings   Food Insecurity: No Food Insecurity (08/04/2023)  Housing: Low Risk  (08/04/2023)  Transportation Needs: No Transportation Needs (08/04/2023)  Utilities: Not At Risk (08/04/2023)  Depression (PHQ2-9): Low Risk  (06/08/2022)  Tobacco Use: Medium Risk (08/04/2023)     Readmission Risk Interventions    06/20/2023   12:59 PM  Readmission Risk Prevention Plan  Transportation Screening Complete  PCP or Specialist Appt within 3-5 Days Complete  HRI or Home Care Consult Complete  Social Work Consult for Recovery Care Planning/Counseling Complete  Palliative Care Screening Not Applicable  Medication Review Oceanographer) Complete

## 2023-08-07 NOTE — Plan of Care (Signed)
  Problem: Clinical Measurements: Goal: Respiratory complications will improve Outcome: Progressing   Problem: Activity: Goal: Risk for activity intolerance will decrease Outcome: Progressing   Problem: Coping: Goal: Level of anxiety will decrease Outcome: Progressing   Problem: Elimination: Goal: Will not experience complications related to bowel motility Outcome: Progressing Goal: Will not experience complications related to urinary retention Outcome: Progressing   Problem: Pain Management: Goal: General experience of comfort will improve Outcome: Progressing   Problem: Safety: Goal: Ability to remain free from injury will improve Outcome: Progressing   Problem: Skin Integrity: Goal: Risk for impaired skin integrity will decrease Outcome: Progressing

## 2023-08-07 NOTE — Discharge Summary (Signed)
Physician Discharge Summary   Patient: Andre [name] MRN: [id number] DOB: 1951/01/17  Admit date:     08/04/2023  Discharge date: 08/07/23  Discharge Physician: Alberteen Sam   PCP: [name], MD     Recommendations at discharge:  Follow-up with Dr. Truett Perna as previously planned [facility]: Monitor BP and stop midodrine if appropriate     Discharge Diagnoses: Principal Problem:   SIRS (systemic inflammatory response syndrome), sepsis ruled out Active Problems:   Jaw pain, osteonecrosis of the jaw were ruled out   Pancytopenia (HCC)   Hyperlipidemia   Hypothyroidism   Multiple myeloma not having achieved remission (HCC)   Hyponatremia   Mild cognitive impairment      Hospital Course: 72 y.o. M with MM s/p CAR-T therapy Jan 2024 with pancytopenia and cognitive impairment, HTN, HLD, hypothyroidism and cognitive impairment, presented with increased confusion and fever.   Significant events: 8/17-23: Admited for large aspiration requiring intubation, PsA bacteremia  9/1-6: Readmitted for hypoxia/confusion, found to have PsA UTI  9/23-10/2: Readmitted for positive blood culture at SNF, hypotension; culture ultimately turned out to be contaminant, was treated with transfusions and discharged back to SNF       * SIRS (systemic inflammatory response syndrome) (HCC) Appears there was some initial misunderstanding that the patient is on active chemo (he is not, he gets weekly Nplate and monthyl IVIG and the infusion he had this week was of IVIG).  He is immune suppressed from his extensive history of prior chemo and his multiple myeloma, and so he was admitted on empiric antibitoics.  Urinalysis normal, CT chest rule out pneumonia.  Respiratory virus panel and COVID/flu/RSV PCR was negative.  Blood cultures were negative at 48 hours and so antibiotics were stopped, he was observed for further 24 hours and had no further fever, felt at baseline, vital signs  normal and was discharged home.       Pancytopenia (HCC) Thrombocytopenia Macrocytic anemia Counts at baseline, no bleeding, no neutropenia.  Mild cognitive impairment Encephalopathy ruled out.     Hyponatremia Normalized with fluids  Multiple myeloma not having achieved remission (HCC) Per Oncology, relapsed/refractory IgG lambda MM s/p 8th line therapy with carfilzomib, cyclophosphamide and dexamethasone followed by CAR T cell therapy with Cilta-cel on 09/26/2022 with good partial response. Subsequently, protracted cytopenias and global functional and cognitive decline (former due to exhausted marrow reserves from extensive prior therapy for MM and latter due to chronic ischemic changes as well as movement and cognitive toxicities (MNTs) of CAR T cell therapy).   On weekly Nplate, monthly IVIG, daily dapsone and Valtrex  Hypothyroidism On levothyroxine  Hyperlipidemia              The [facility] Controlled Substances Registry was reviewed for this patient prior to discharge.  Consultants: Oncology Dr. Truett Perna Procedures performed: None  Disposition: Long term care facility Diet recommendation:  Regular diet  DISCHARGE MEDICATION: Allergies as of 08/07/2023       Reactions   Atorvastatin Other (See Comments)   (LIPITOR) "Allergic," per [facility]   Rosuvastatin Other (See Comments)   (CRESTOR) Liver Disorder (adverse effects) and "Allergic," per [facility]   Beef-derived Products    pescatarian   Chicken Protein    pescatarian   Pork-derived Products    pescatarian   Septra [sulfamethoxazole-trimethoprim] Rash, Other (See Comments)   "Allergic," per [facility]        Medication List     TAKE these medications    acetaminophen 650  MG suppository Commonly known as: TYLENOL Place 650 mg rectally every 4 (four) hours as needed for mild pain (pain score 1-3).   acetaminophen 500 MG tablet Commonly known as: TYLENOL Take 2 tablets (1,000 mg total) by mouth every 8  (eight) hours.   calcium carbonate 750 MG chewable tablet Commonly known as: TUMS EX Chew 2 tablets by mouth 3 (three) times daily as needed (for indigestion).   cyanocobalamin 1000 MCG tablet Commonly known as: VITAMIN B12 Take 1 tablet (1,000 mcg total) by mouth daily.   dapsone 100 MG tablet Take 100 mg by mouth daily.   docusate sodium 100 MG capsule Commonly known as: COLACE Take 100 mg by mouth daily as needed (for constipation).   escitalopram 10 MG tablet Commonly known as: LEXAPRO Take 1 tablet (10 mg total) by mouth daily.   ferrous sulfate 325 (65 FE) MG EC tablet Take 325 mg by mouth 2 (two) times daily.   folic acid 1 MG tablet Commonly known as: FOLVITE Take 1 tablet (1 mg total) by mouth daily.   levothyroxine 125 MCG tablet Commonly known as: SYNTHROID Take 125 mcg by mouth daily before breakfast.   methylphenidate 5 MG tablet Commonly known as: Ritalin Take 0.5 tablets (2.5 mg total) by mouth every morning.   midodrine 2.5 MG tablet Commonly known as: PROAMATINE Take 1 tablet (2.5 mg total) by mouth 2 (two) times daily with a meal.   Muscle Rub 10-15 % Crea Apply 1 Application topically as needed for muscle pain.   ondansetron 4 MG tablet Commonly known as: ZOFRAN Take 4 mg by mouth every 8 (eight) hours as needed for nausea or vomiting.   pantoprazole 40 MG tablet Commonly known as: Protonix Take 1 tablet (40 mg total) by mouth daily.   tamsulosin 0.4 MG Caps capsule Commonly known as: FLOMAX Take 0.4 mg by mouth daily.   UP4 PROBIOTICS WOMENS PO Take 1 capsule by mouth daily.   valACYclovir 500 MG tablet Commonly known as: VALTREX Take 500 mg by mouth daily.   Vitamin D3 25 MCG (1000 UT) Caps Take 1,000 Units by mouth daily.        Contact information for after-discharge care     Destination     [facility] .   Service: Skilled Nursing Contact information: [address] [place] [address] [phone]                       Discharge Exam: Ceasar Mons Weights   08/04/23 1100  Weight: 82.6 kg    General: Pt is alert, awake, not in acute distress, lying in bed Cardiovascular: RRR, nl S1-S2, no murmurs appreciated.   No LE edema.   Respiratory: Normal respiratory rate and rhythm.  CTAB without rales or wheezes. Abdominal: Abdomen soft and non-tender.  No distension or HSM.   Neuro/Psych: Strength symmetric in upper extremities, overall weak.  Judgment and insight appear at baseline dementia.   Condition at discharge: fair  The results of significant diagnostics from this hospitalization (including imaging, microbiology, ancillary and laboratory) are listed below for reference.   Imaging Studies: MR TMJ  Result Date: 08/06/2023 CLINICAL DATA:  Jaw pain for 2 months EXAM: MRI OF TEMPOROMANDIBULAR JOINT WITHOUT CONTRAST TECHNIQUE: Multiplanar, multisequence MR imaging of the temporomandibular joint was performed following the standard protocol. No intravenous contrast was administered. Sequential bite blocks of 4 mm, 8 mm, 12 mm, 16 mm, and 20 mm were used for  open mouth sequences. COMPARISON:  CT 05/23/2023, MRI 05/21/2023 FINDINGS: Technical Note: Despite efforts by the technologist and patient, motion artifact is present on today's exam and could not be eliminated. This reduces exam sensitivity and specificity. Right temporomandibular joint: Moderate osteoarthritis of the right temporomandibular joint with mild flattening of the mandibular condyle. Diminutive appearance of the articular disc. Disc does remain positioned between the mandibular condyle and temporal bone in both open and closed mouth positions. There is normal anterior translation of the mandibular condyle with jaw opening. No erosions or joint effusion. Left temporomandibular joint: Moderate osteoarthritis of the left temporomandibular joint with mild flattening of the mandibular  condyle. Diminutive appearance of the articular disc. Disc does remain positioned between the mandibular condyle and temporal bone in both open and closed mouth positions. There is normal anterior translation of the mandibular condyle with jaw opening. No erosions or joint effusion. Other: None. IMPRESSION: 1. 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 2. Diminutive appearance of the bilateral articular discs. Discs do remain positioned between the mandibular condyles and temporal bones in both open and closed mouth positions. Electronically Signed   By: Duanne Guess D.O.   On: 08/06/2023 15:56   CT CHEST WO CONTRAST  Result Date: 08/05/2023 CLINICAL DATA:  Respiratory illness. EXAM: CT CHEST WITHOUT CONTRAST TECHNIQUE: Multidetector CT imaging of the chest was performed following the standard protocol without IV contrast. RADIATION DOSE REDUCTION: This exam was performed according to the departmental dose-optimization program which includes automated exposure control, adjustment of the mA and/or kV according to patient size and/or use of iterative reconstruction technique. COMPARISON:  May 20, 2023 FINDINGS: Cardiovascular: Calcific atherosclerotic disease of the coronary arteries. Normal heart size. No pericardial effusion. Mediastinum/Nodes: No enlarged mediastinal or axillary lymph nodes. Thyroid gland, trachea, and esophagus demonstrate no significant findings. Lungs/Pleura: Streaky airspace opacities in the right more the left lung base with mild pleural thickening. These findings are not significantly changed from September 2024. Upper Abdomen: Cholelithiasis. Large hiatal hernia containing most of the gastric body. Musculoskeletal: Healed bilateral rib fractures. Chronic mild T12 compression fracture. IMPRESSION: 1. Streaky airspace opacities in the right more the left lung base with mild pleural thickening. These findings are not significantly changed from September  2024. 2. Calcific atherosclerotic disease of the coronary arteries. 3. Cholelithiasis. 4. Large hiatal hernia containing most of the gastric body. 5. Chronic mild T12 compression fracture. 6. Healed bilateral rib fractures. 7. Aortic atherosclerosis. Aortic Atherosclerosis (ICD10-I70.0). Electronically Signed   By: Ted Mcalpine M.D.   On: 08/05/2023 19:36   DG Chest Port 1 View  Result Date: 08/04/2023 CLINICAL DATA:  History of multiple myeloma with sepsis EXAM: PORTABLE CHEST 1 VIEW COMPARISON:  Chest radiograph dated 06/20/2023 FINDINGS: Unchanged elevation of the right hemidiaphragm. Normal lung volumes. Heterogeneous left retrocardiac opacity. No pleural effusion or pneumothorax. The heart size and mediastinal contours are within normal limits. No acute osseous abnormality. IMPRESSION: 1. Heterogeneous left retrocardiac opacity, likely reflecting known hernia. 2.  No new focal consolidations. Electronically Signed   By: Agustin Cree M.D.   On: 08/04/2023 12:01    Microbiology: Results for orders placed or performed during the hospital encounter of 08/04/23  Blood Culture (routine x 2)     Status: None (Preliminary result)   Collection Time: 08/04/23 11:15 AM   Specimen: BLOOD  Result Value Ref Range Status   Specimen Description   Final    BLOOD BLOOD LEFT ARM Performed at [facility], [address] [place], [place] [address]    Special Requests   Final    BOTTLES DRAWN AEROBIC AND ANAEROBIC Blood Culture adequate volume Performed at [facility], [address] [place], [place] [address]    Culture   Final    NO GROWTH 2 DAYS Performed at [facility], [address] [place], [place] [address]    Report Status PENDING  Incomplete  Blood Culture (routine x 2)     Status: None (Preliminary result)   Collection Time: 08/04/23 11:15 AM   Specimen: BLOOD  Result Value Ref Range Status   Specimen Description   Final    BLOOD BLOOD  RIGHT ARM Performed at [facility], [address], [place], [place] [address]    Special Requests   Final    BOTTLES DRAWN AEROBIC AND ANAEROBIC Blood Culture adequate volume Performed at [facility], [address] [place], [place] [address]    Culture   Final    NO GROWTH 2 DAYS Performed at [facility], [address] [place], [place] [address]    Report Status PENDING  Incomplete  Resp panel by RT-PCR (RSV, Flu A&B, Covid) Anterior Nasal Swab     Status: None   Collection Time: 08/04/23 12:07 PM   Specimen: Anterior Nasal Swab  Result Value Ref Range Status   SARS Coronavirus 2 by RT PCR NEGATIVE NEGATIVE Final    Comment: (NOTE) SARS-CoV-2 target nucleic acids are NOT DETECTED.  The SARS-CoV-2 RNA is generally detectable in upper respiratory specimens during the acute phase of infection. The lowest concentration of SARS-CoV-2 viral copies this assay can detect is 138 copies/mL. A negative result does not preclude SARS-Cov-2 infection and should not be used as the sole basis for treatment or other patient management decisions. A negative result may occur with  improper specimen collection/handling, submission of specimen other than nasopharyngeal swab, presence of viral mutation(s) within the areas targeted by this assay, and inadequate number of viral copies(<138 copies/mL). A negative result must be combined with clinical observations, patient history, and epidemiological information. The expected result is Negative.  Fact Sheet for Patients:  BloggerCourse.com  Fact Sheet for Healthcare Providers:  SeriousBroker.it  This test is no t yet approved or cleared by the [place] FDA and  has been authorized for detection and/or diagnosis of SARS-CoV-2 by FDA under an Emergency Use Authorization (EUA). This EUA will remain  in effect (meaning this test can be used) for the  duration of the COVID-19 declaration under Section 564(b)(1) of the Act, 21 U.S.C.section 360bbb-3(b)(1), unless the authorization is terminated  or revoked sooner.       Influenza A by PCR NEGATIVE NEGATIVE Final   Influenza B by PCR NEGATIVE NEGATIVE Final    Comment: (NOTE) The Xpert Xpress SARS-CoV-2/FLU/RSV plus assay is intended as an aid in the diagnosis of influenza from Nasopharyngeal swab specimens and should not be used as a sole basis for treatment. Nasal washings and aspirates are unacceptable for Xpert Xpress SARS-CoV-2/FLU/RSV testing.  Fact Sheet for Patients: BloggerCourse.com  Fact Sheet for Healthcare Providers: SeriousBroker.it  This test is not yet approved or cleared by the [place] FDA and has been authorized for detection and/or diagnosis of SARS-CoV-2 by FDA under an Emergency Use Authorization (EUA). This EUA will remain in effect (meaning this test can be used) for the duration of the COVID-19 declaration under Section 564(b)(1) of the Act, 21 U.S.C. section 360bbb-3(b)(1),  unless the authorization is terminated or revoked.     Resp Syncytial Virus by PCR NEGATIVE NEGATIVE Final    Comment: (NOTE) Fact Sheet for Patients: BloggerCourse.com  Fact Sheet for Healthcare Providers: SeriousBroker.it  This test is not yet approved or cleared by the [place] FDA and has been authorized for detection and/or diagnosis of SARS-CoV-2 by FDA under an Emergency Use Authorization (EUA). This EUA will remain in effect (meaning this test can be used) for the duration of the COVID-19 declaration under Section 564(b)(1) of the Act, 21 U.S.C. section 360bbb-3(b)(1), unless the authorization is terminated or revoked.  Performed at [facility], [address] [place], [place] [address]   MRSA Next Gen by PCR, Nasal     Status: None    Collection Time: 08/04/23  5:41 PM   Specimen: Nasal Mucosa; Nasal Swab  Result Value Ref Range Status   MRSA by PCR Next Gen NOT DETECTED NOT DETECTED Final    Comment: (NOTE) The GeneXpert MRSA Assay (FDA approved for NASAL specimens only), is one component of a comprehensive MRSA colonization surveillance program. It is not intended to diagnose MRSA infection nor to guide or monitor treatment for MRSA infections. Test performance is not FDA approved in patients less than 41 years old. Performed at [facility], [address] [place], [place] [address]   Respiratory (~20 pathogens) panel by PCR     Status: None   Collection Time: 08/05/23  8:51 AM   Specimen: Nasopharyngeal Swab; Respiratory  Result Value Ref Range Status   Adenovirus NOT DETECTED NOT DETECTED Final   Coronavirus 229E NOT DETECTED NOT DETECTED Final    Comment: (NOTE) The Coronavirus on the Respiratory Panel, DOES NOT test for the novel  Coronavirus (2019 nCoV)    Coronavirus HKU1 NOT DETECTED NOT DETECTED Final   Coronavirus NL63 NOT DETECTED NOT DETECTED Final   Coronavirus OC43 NOT DETECTED NOT DETECTED Final   Metapneumovirus NOT DETECTED NOT DETECTED Final   Rhinovirus / Enterovirus NOT DETECTED NOT DETECTED Final   Influenza A NOT DETECTED NOT DETECTED Final   Influenza B NOT DETECTED NOT DETECTED Final   Parainfluenza Virus 1 NOT DETECTED NOT DETECTED Final   Parainfluenza Virus 2 NOT DETECTED NOT DETECTED Final   Parainfluenza Virus 3 NOT DETECTED NOT DETECTED Final   Parainfluenza Virus 4 NOT DETECTED NOT DETECTED Final   Respiratory Syncytial Virus NOT DETECTED NOT DETECTED Final   Bordetella pertussis NOT DETECTED NOT DETECTED Final   Bordetella Parapertussis NOT DETECTED NOT DETECTED Final   Chlamydophila pneumoniae NOT DETECTED NOT DETECTED Final   Mycoplasma pneumoniae NOT DETECTED NOT DETECTED Final    Comment: Performed at [facility], [address],  [place], [place] [address]   *Note: Due to a large number of results and/or encounters for the requested time period, some results have not been displayed. A complete set of results can be found in Results Review.    Labs: CBC: Recent Labs  Lab 08/02/23 0825 08/04/23 1115 08/05/23 0532 08/06/23 0453  WBC 3.6* 4.6 3.6* 3.2*  NEUTROABS 2.5 3.3 2.5 2.0  HGB 9.6* 9.1* 9.2* 8.5*  HCT 28.5* 28.0* 29.4* 26.2*  MCV 116.3* 119.1* 121.0* 117.0*  PLT 33* 27* 24* 22*   Basic Metabolic Panel: Recent Labs  Lab 08/02/23 0825 08/04/23 1115 08/05/23 0532  NA 137 129* 137  K 4.0 3.8 4.3  CL 103 100 104  CO2 27 21* 25  GLUCOSE 102* 95  94  BUN 16 14 14   CREATININE 0.92 0.78 0.80  CALCIUM 9.4 8.5* 9.1   Liver Function Tests: Recent Labs  Lab 08/02/23 0825 08/04/23 1115 08/05/23 0532  AST 16 24 20   ALT 14 18 16   ALKPHOS 75 74 71  BILITOT 0.4 0.6 0.7  PROT 6.4* 6.9 6.8  ALBUMIN 4.2 3.4* 3.3*   CBG: No results for input(s): "GLUCAP" in the last 168 hours.  Discharge time spent: approximately 35 minutes spent on discharge counseling, evaluation of patient on day of discharge, and coordination of discharge planning with nursing, social work, pharmacy and case management  Signed: Alberteen Sam, MD [facility] 08/07/2023

## 2023-08-08 ENCOUNTER — Non-Acute Institutional Stay (SKILLED_NURSING_FACILITY): Payer: Medicare Other | Admitting: Internal Medicine

## 2023-08-08 ENCOUNTER — Encounter: Payer: Self-pay | Admitting: Internal Medicine

## 2023-08-08 DIAGNOSIS — E039 Hypothyroidism, unspecified: Secondary | ICD-10-CM | POA: Diagnosis not present

## 2023-08-08 DIAGNOSIS — R6884 Jaw pain: Secondary | ICD-10-CM

## 2023-08-08 DIAGNOSIS — I951 Orthostatic hypotension: Secondary | ICD-10-CM

## 2023-08-08 DIAGNOSIS — D61818 Other pancytopenia: Secondary | ICD-10-CM | POA: Diagnosis not present

## 2023-08-08 DIAGNOSIS — A689 Relapsing fever, unspecified: Secondary | ICD-10-CM

## 2023-08-08 DIAGNOSIS — R4189 Other symptoms and signs involving cognitive functions and awareness: Secondary | ICD-10-CM

## 2023-08-08 DIAGNOSIS — R195 Other fecal abnormalities: Secondary | ICD-10-CM

## 2023-08-08 DIAGNOSIS — F32A Depression, unspecified: Secondary | ICD-10-CM

## 2023-08-08 DIAGNOSIS — C9000 Multiple myeloma not having achieved remission: Secondary | ICD-10-CM

## 2023-08-08 DIAGNOSIS — R1110 Vomiting, unspecified: Secondary | ICD-10-CM

## 2023-08-08 NOTE — Progress Notes (Signed)
Provider:   Location:  [facility] Room Number: 153A Place of Service:  SNF (31)  PCP: [name], MD Patient Care Team: [name], MD as PCP - General (Internal Medicine) Ronalee Red, Wynelle Cleveland, MD as Consulting Physician (Neurology) Eddie Candle, MD as Consulting Physician (Internal Medicine) York Spaniel, MD (Inactive) as Consulting Physician (Neurology) Sharlett Iles, DDS as Referring Physician (Oral Surgery) Ladene Artist, MD as Consulting Physician (Oncology) Fletcher Anon, NP as Nurse Practitioner (Nurse Practitioner)  Extended Emergency Contact Information Primary Emergency Contact: Vesely,[name] Address: [address]          [place], [place] [address] [name] of [place] Home Phone: [phone] Relation: Spouse  Code Status: DNR Goals of Care: Advanced Directive information    08/08/2023    3:23 PM  Advanced Directives  Does Patient Have a Medical Advance Directive? Yes  Type of Advance Directive Out of facility DNR (pink MOST or yellow form)  Does patient want to make changes to medical advance directive? No - Patient declined  Would patient like information on creating a medical advance directive? No - Patient declined  Pre-existing out of facility DNR order (yellow form or pink MOST form) Yellow form placed in chart (order not valid for inpatient use)      Chief Complaint  Patient presents with   Readmit To SNF    Patient is being seen for readmit     HPI: Patient is a 72 y.o. male seen today for Readmission to Rehab  [facility] Was admitted in the hospital from 11/16 through 11/19 for SIRS.  Patient has h/o Multiple Myeloma diagnosed in 2012 Post CART therapy per Dr Marissa Calamity on 01/24  In Clinical Remission On IVIG monthly per [facility] due to CART and Chronic Microvascular changes On Ritalin Recurent falls due to Unstable Gait Anemia with Occult positive  stools Pancytopenia due to BM failure  Patient has had number of admissions in the hospital due to Anemia and hypertension in 06/14/23 Sepsis in 05/20/23 Pneumonia in 08/24  He was again sent to the hospital due to fever and change in mental status He was diagnosed with SIRS plus.  Received cefepime, metronidazole, vancomycin Blood cultures and Urine negative CT of the chest negative MRI of the jaw was negative He became afebrile and patient was discharged back to the facility with no antibiotics He did have 1 episode of vomiting last night.  But seems to be at his baseline today.  Continues to have off-and-on pain in his left side of the jaw relieved by tramadol. Past Medical History:  Diagnosis Date   Anemia    Arthritis    Asthma    no treatment x 20 years   Depression    Double vision    occurs at times    Duodenal ulcer    GERD (gastroesophageal reflux disease)    Hemorrhage of rectum and anus 09/15/2013   Hyperlipidemia    Hypertension    Hypothyroidism    Leukopenia due to antineoplastic chemotherapy (HCC) 02/22/2017   Multiple myeloma 07/04/2011   Thyroid disease    TIA (transient ischemic attack) 03/27/2012   Transient diplopia 03/27/2012   Transient global amnesia 06/07/2015   Past Surgical History:  Procedure Laterality Date   BONE MARROW TRANSPLANT  2011   for MM   CARDIOLITE STUDY  11/25/2003   NORMAL   TONSILLECTOMY      reports that he quit smoking about 54  years ago. His smoking use included cigarettes. He started smoking about 57 years ago. He has never used smokeless tobacco. He reports that he does not drink alcohol and does not use drugs. Social History   Socioeconomic History   Marital status: Married    Spouse name: Not on file   Number of children: 2   Years of education: 16   Highest education level: Not on file  Occupational History   Not on file  Tobacco Use   Smoking status: Former    Current packs/day: 0.00    Types: Cigarettes     Start date: 03/27/1966    Quit date: 03/27/1969    Years since quitting: 54.4   Smokeless tobacco: Never  Vaping Use   Vaping status: Never Used  Substance and Sexual Activity   Alcohol use: No   Drug use: No   Sexual activity: Not Currently  Other Topics Concern   Not on file  Social History Narrative   Right handed   Un[employer] fan   Drinks caffeine   [facility]   [facility] 8 years    Social Determinants of Health   Financial Resource Strain: Not on file  Food Insecurity: No Food Insecurity (08/04/2023)   Hunger Vital Sign    Worried About Running Out of Food in the Last Year: Never true    Ran Out of Food in the Last Year: Never true  Transportation Needs: No Transportation Needs (08/04/2023)   PRAPARE - Administrator, Civil Service (Medical): No    Lack of Transportation (Non-Medical): No  Physical Activity: Not on file  Stress: Not on file  Social Connections: Not on file  Intimate Partner Violence: Not At Risk (08/04/2023)   Humiliation, Afraid, Rape, and Kick questionnaire    Fear of Current or Ex-Partner: No    Emotionally Abused: No    Physically Abused: No    Sexually Abused: No    Functional Status Survey:    Family History  Problem Relation Age of Onset   Throat cancer Mother    Hypertension Father    Stroke Father    Asthma Father    Diabetes Father    Pancreatic cancer Brother     Health Maintenance  Topic Date Due   Medicare Annual Wellness (AWV)  Never done   Pneumonia Vaccine 14+ Years old (3 of 3 - PPSV23 or PCV20) 06/16/2018   COVID-19 Vaccine (6 - 2023-24 season) 05/20/2023   Colonoscopy  09/24/2023   Zoster Vaccines- Shingrix (1 of 2) 02/18/2024 (Originally 06/06/1970)   DTaP/Tdap/Td (5 - Tdap) 03/06/2028   INFLUENZA VACCINE  Completed   HPV VACCINES  Aged Out   Hepatitis C Screening  Discontinued    Allergies  Allergen Reactions   Atorvastatin Other (See Comments)    (LIPITOR) "Allergic," per [facility]   Rosuvastatin Other  (See Comments)    (CRESTOR) Liver Disorder (adverse effects) and "Allergic," per [facility]   Beef-Derived Products     pescatarian    Chicken Protein     pescatarian   Pork-Derived Products     pescatarian    Septra [Sulfamethoxazole-Trimethoprim] Rash and Other (See Comments)    "Allergic," per [facility]    Outpatient Encounter Medications as of 08/08/2023  Medication Sig   acetaminophen (TYLENOL) 500 MG tablet Take 2 tablets (1,000 mg total) by mouth every 8 (eight) hours.   calcium carbonate (TUMS EX) 750 MG chewable tablet Chew 2 tablets by mouth 3 (three) times daily as needed (  for indigestion).   Cholecalciferol (VITAMIN D3) 25 MCG (1000 UT) capsule Take 1,000 Units by mouth daily.   cyanocobalamin (VITAMIN B12) 1000 MCG tablet Take 1 tablet (1,000 mcg total) by mouth daily.   dapsone 100 MG tablet Take 100 mg by mouth daily.   docusate sodium (COLACE) 100 MG capsule Take 100 mg by mouth daily as needed (for constipation).   escitalopram (LEXAPRO) 10 MG tablet Take 1 tablet (10 mg total) by mouth daily.   ferrous sulfate 325 (65 FE) MG EC tablet Take 325 mg by mouth 2 (two) times daily.   folic acid (FOLVITE) 1 MG tablet Take 1 tablet (1 mg total) by mouth daily.   levothyroxine (SYNTHROID) 125 MCG tablet Take 125 mcg by mouth daily before breakfast.   Menthol-Methyl Salicylate (MUSCLE RUB) 10-15 % CREA Apply 1 Application topically as needed for muscle pain.   methylphenidate (RITALIN) 5 MG tablet Take 0.5 tablets (2.5 mg total) by mouth every morning.   midodrine (PROAMATINE) 2.5 MG tablet Take 1 tablet (2.5 mg total) by mouth 2 (two) times daily with a meal.   ondansetron (ZOFRAN) 4 MG tablet Take 4 mg by mouth every 8 (eight) hours as needed for nausea or vomiting.   pantoprazole (PROTONIX) 40 MG tablet Take 1 tablet (40 mg total) by mouth daily.   Probiotic Product (UP4 PROBIOTICS WOMENS PO) Take 1 capsule by mouth daily.   tamsulosin (FLOMAX) 0.4 MG CAPS capsule Take 0.4 mg by mouth  daily.   traMADol (ULTRAM) 50 MG tablet Take 50 mg by mouth every 12 (twelve) hours as needed.   valACYclovir (VALTREX) 500 MG tablet Take 500 mg by mouth daily.   acetaminophen (TYLENOL) 650 MG suppository Place 650 mg rectally every 4 (four) hours as needed for mild pain (pain score 1-3). (Patient not taking: Reported on 08/08/2023)   [DISCONTINUED] prochlorperazine (COMPAZINE) 10 MG tablet Take 1 tablet (10 mg total) by mouth every 6 (six) hours as needed (Nausea or vomiting).   No facility-administered encounter medications on file as of 08/08/2023.    Review of Systems  Constitutional:  Negative for activity change, appetite change and unexpected weight change.  HENT: Negative.    Respiratory:  Negative for cough and shortness of breath.   Cardiovascular:  Negative for leg swelling.  Gastrointestinal:  Negative for constipation.  Genitourinary:  Negative for frequency.  Musculoskeletal:  Positive for gait problem. Negative for arthralgias and myalgias.  Skin: Negative.  Negative for rash.  Neurological:  Negative for dizziness and weakness.  Psychiatric/Behavioral:  Positive for confusion. Negative for sleep disturbance.   All other systems reviewed and are negative.   Vitals:   08/08/23 1515  BP: 116/65  Pulse: 62  Resp: 14  Temp: (!) 97.3 F (36.3 C)  TempSrc: Temporal  SpO2: 90%  Weight: 176 lb 12.8 oz (80.2 kg)  Height: 5\' 7"  (1.702 m)   Body mass index is 27.69 kg/m. Physical Exam Vitals reviewed.  Constitutional:      Appearance: Normal appearance.  HENT:     Head: Normocephalic.     Nose: Nose normal.     Mouth/Throat:     Mouth: Mucous membranes are moist.     Pharynx: Oropharynx is clear.  Eyes:     Pupils: Pupils are equal, round, and reactive to light.  Cardiovascular:     Rate and Rhythm: Normal rate and regular rhythm.     Pulses: Normal pulses.     Heart sounds: No murmur heard. Pulmonary:  Effort: Pulmonary effort is normal. No respiratory  distress.     Breath sounds: Normal breath sounds. No rales.  Abdominal:     General: Abdomen is flat. Bowel sounds are normal.     Palpations: Abdomen is soft.  Musculoskeletal:        General: Swelling present.     Cervical back: Neck supple.  Skin:    General: Skin is warm.  Neurological:     General: No focal deficit present.     Mental Status: He is alert and oriented to person, place, and time.  Psychiatric:        Mood and Affect: Mood normal.        Thought Content: Thought content normal.     Labs reviewed: Basic Metabolic Panel: Recent Labs    05/23/23 0413 06/06/23 0849 06/16/23 0536 06/17/23 0517 06/20/23 0458 06/27/23 0933 08/02/23 0825 08/04/23 1115 08/05/23 0532  NA 134*   < > 137   < > 137 139 137 129* 137  K 3.7   < > 3.9   < > 4.2 3.7 4.0 3.8 4.3  CL 101   < > 104   < > 105 105 103 100 104  CO2 25   < > 25   < > 24 25 27  21* 25  GLUCOSE 97   < > 92   < > 86 114* 102* 95 94  BUN 21   < > 17   < > 21 16 16 14 14   CREATININE 0.91   < > 0.92   < > 0.89 0.93 0.92 0.78 0.80  CALCIUM 8.9   < > 8.8*   < > 9.2 9.9 9.4 8.5* 9.1  MG 2.1  --  2.3  --  2.4 1.9  --   --   --   PHOS 2.4*  --  3.7  --   --  2.8  --   --   --    < > = values in this interval not displayed.   Liver Function Tests: Recent Labs    08/02/23 0825 08/04/23 1115 08/05/23 0532  AST 16 24 20   ALT 14 18 16   ALKPHOS 75 74 71  BILITOT 0.4 0.6 0.7  PROT 6.4* 6.9 6.8  ALBUMIN 4.2 3.4* 3.3*   Recent Labs    10/29/22 2042 05/05/23 1901 05/20/23 1643  LIPASE 57* 30 26   Recent Labs    05/20/23 1825  AMMONIA 26   CBC: Recent Labs    08/04/23 1115 08/05/23 0532 08/06/23 0453  WBC 4.6 3.6* 3.2*  NEUTROABS 3.3 2.5 2.0  HGB 9.1* 9.2* 8.5*  HCT 28.0* 29.4* 26.2*  MCV 119.1* 121.0* 117.0*  PLT 27* 24* 22*   Cardiac Enzymes: Recent Labs    05/22/23 0420  CKTOTAL 28*   BNP: Invalid input(s): "POCBNP" Lab Results  Component Value Date   HGBA1C 4.3 (L) 05/06/2023    Lab Results  Component Value Date   TSH 0.173 (L) 05/20/2023   Lab Results  Component Value Date   VITAMINB12 1,163 (H) 05/20/2023   Lab Results  Component Value Date   FOLATE 27.7 05/20/2023   Lab Results  Component Value Date   IRON 199 (H) 06/06/2023   TIBC 461 (H) 06/06/2023   FERRITIN 175 06/06/2023    Imaging and Procedures obtained prior to SNF admission: CT CHEST WO CONTRAST  Result Date: 08/05/2023 CLINICAL DATA:  Respiratory illness. EXAM: CT CHEST WITHOUT CONTRAST TECHNIQUE: Multidetector CT imaging of  the chest was performed following the standard protocol without IV contrast. RADIATION DOSE REDUCTION: This exam was performed according to the departmental dose-optimization program which includes automated exposure control, adjustment of the mA and/or kV according to patient size and/or use of iterative reconstruction technique. COMPARISON:  May 20, 2023 FINDINGS: Cardiovascular: Calcific atherosclerotic disease of the coronary arteries. Normal heart size. No pericardial effusion. Mediastinum/Nodes: No enlarged mediastinal or axillary lymph nodes. Thyroid gland, trachea, and esophagus demonstrate no significant findings. Lungs/Pleura: Streaky airspace opacities in the right more the left lung base with mild pleural thickening. These findings are not significantly changed from September 2024. Upper Abdomen: Cholelithiasis. Large hiatal hernia containing most of the gastric body. Musculoskeletal: Healed bilateral rib fractures. Chronic mild T12 compression fracture. IMPRESSION: 1. Streaky airspace opacities in the right more the left lung base with mild pleural thickening. These findings are not significantly changed from September 2024. 2. Calcific atherosclerotic disease of the coronary arteries. 3. Cholelithiasis. 4. Large hiatal hernia containing most of the gastric body. 5. Chronic mild T12 compression fracture. 6. Healed bilateral rib fractures. 7. Aortic  atherosclerosis. Aortic Atherosclerosis (ICD10-I70.0). Electronically Signed   By: Ted Mcalpine M.D.   On: 08/05/2023 19:36   DG Chest Port 1 View  Result Date: 08/04/2023 CLINICAL DATA:  History of multiple myeloma with sepsis EXAM: PORTABLE CHEST 1 VIEW COMPARISON:  Chest radiograph dated 06/20/2023 FINDINGS: Unchanged elevation of the right hemidiaphragm. Normal lung volumes. Heterogeneous left retrocardiac opacity. No pleural effusion or pneumothorax. The heart size and mediastinal contours are within normal limits. No acute osseous abnormality. IMPRESSION: 1. Heterogeneous left retrocardiac opacity, likely reflecting known hernia. 2.  No new focal consolidations. Electronically Signed   By: Agustin Cree M.D.   On: 08/04/2023 12:01    Assessment/Plan 1. Recurrent fever Patient continue sot have episodes of Fever with Change in mental status Will Make referral to Infectious disease  2. Pancytopenia (HCC) Bone mArrow Failure after CART therapy   3. Orthostatic hypotension Doing well with low dose of Midodrine Mostly to avoid his falls with BP fluctuation Hold Midodrine if SBP mor ethen 140  4. Acquired hypothyroidism TSH needs to be followed Will write to repeat it  5. Cognitive impairment Due to Vascular Disease and CART therapy  Continues to stay in SNF  6. Heme positive stool No Work up due to Low Platelets  7. Multiple myeloma, In remission  On IVIG Per Oncology On Dapsone and Valtrex for Prophylaxis  8. Depression, unspecified depression type Lexapro  9. Vomiting, unspecified vomiting type, unspecified whether nausea present One episode Change the dose of Tramadol to 25 mg BID PRN  10. Jaw pain MRI showed Osteoarthritis 11 BPH On Flomax per urology 12 Thrombocytopenia On N Plate infusion  Family/ staff Communication:   Labs/tests ordered:TSH

## 2023-08-08 NOTE — Consult Note (Addendum)
[facility] Liaison Consult Note    08/08/2023 10:29 am  [facility] [[facility]] remote coverage review for patient admitted and discharged from [name]  for post hospital care  Andre [name] 10/06/1950 [id number]     Primary Care Provider:  [name], MD with [facility]   Insurance: Medicare ACO REACH  Patient was reviewed for less than 30 days readmission high risk score with 4 hospitalizations in the past 6 months.  Patient was screened for hospitalization and on behalf of [facility]  Care Coordination to assess for post hospital community care needs.  Patient is being considered for a skilled nursing facility level of care for post hospital transition.  Patient returned to [facility] for ST rehab.   Plan:  No additional follow up needs noted as patient post hospital care is to be coordinated by [facility] for post SNF transitional needs.  For questions or referrals, please contact:  Charlesetta Shanks, RN, BSN, CCM [place]  [facility], [facility] Liaison Direct Dial: [phone] or secure chat Email: [email or web address]

## 2023-08-09 LAB — CULTURE, BLOOD (ROUTINE X 2)
Culture: NO GROWTH
Culture: NO GROWTH
Special Requests: ADEQUATE
Special Requests: ADEQUATE

## 2023-08-09 LAB — TSH: TSH: 6.02 — AB (ref 0.41–5.90)

## 2023-08-10 ENCOUNTER — Inpatient Hospital Stay: Payer: Medicare Other

## 2023-08-10 ENCOUNTER — Ambulatory Visit: Payer: Medicare Other

## 2023-08-10 ENCOUNTER — Other Ambulatory Visit: Payer: Medicare Other

## 2023-08-10 VITALS — BP 106/53 | HR 78 | Temp 97.6°F | Resp 15 | Ht 67.0 in | Wt 176.4 lb

## 2023-08-10 DIAGNOSIS — C9000 Multiple myeloma not having achieved remission: Secondary | ICD-10-CM | POA: Diagnosis not present

## 2023-08-10 DIAGNOSIS — D696 Thrombocytopenia, unspecified: Secondary | ICD-10-CM

## 2023-08-10 DIAGNOSIS — D801 Nonfamilial hypogammaglobulinemia: Secondary | ICD-10-CM | POA: Diagnosis present

## 2023-08-10 LAB — CBC WITH DIFFERENTIAL (CANCER CENTER ONLY)
Abs Immature Granulocytes: 0.07 10*3/uL (ref 0.00–0.07)
Basophils Absolute: 0 10*3/uL (ref 0.0–0.1)
Basophils Relative: 1 %
Eosinophils Absolute: 0.1 10*3/uL (ref 0.0–0.5)
Eosinophils Relative: 2 %
HCT: 29 % — ABNORMAL LOW (ref 39.0–52.0)
Hemoglobin: 9.6 g/dL — ABNORMAL LOW (ref 13.0–17.0)
Immature Granulocytes: 2 %
Lymphocytes Relative: 10 %
Lymphs Abs: 0.4 10*3/uL — ABNORMAL LOW (ref 0.7–4.0)
MCH: 37.8 pg — ABNORMAL HIGH (ref 26.0–34.0)
MCHC: 33.1 g/dL (ref 30.0–36.0)
MCV: 114.2 fL — ABNORMAL HIGH (ref 80.0–100.0)
Monocytes Absolute: 0.7 10*3/uL (ref 0.1–1.0)
Monocytes Relative: 16 %
Neutro Abs: 2.9 10*3/uL (ref 1.7–7.7)
Neutrophils Relative %: 69 %
Platelet Count: 23 10*3/uL — ABNORMAL LOW (ref 150–400)
RBC: 2.54 MIL/uL — ABNORMAL LOW (ref 4.22–5.81)
RDW: 15.6 % — ABNORMAL HIGH (ref 11.5–15.5)
WBC Count: 4.2 10*3/uL (ref 4.0–10.5)
nRBC: 0.5 % — ABNORMAL HIGH (ref 0.0–0.2)

## 2023-08-10 MED ORDER — ROMIPLOSTIM INJECTION 500 MCG
7.0000 ug/kg | Freq: Once | SUBCUTANEOUS | Status: AC
  Administered 2023-08-10: 560 ug via SUBCUTANEOUS
  Filled 2023-08-10: qty 1.12

## 2023-08-10 NOTE — Patient Instructions (Signed)
Romiplostim Injection What is this medication? ROMIPLOSTIM (roe mi PLOE stim) treats low levels of platelets in your body caused by immune thrombocytopenia (ITP). It is prescribed when other medications have not worked or cannot be tolerated. It may also be used to help people who have been exposed to high doses of radiation. It works by increasing the amount of platelets in your blood. This lowers the risk of bleeding. This medicine may be used for other purposes; ask your health care provider or pharmacist if you have questions. COMMON BRAND NAME(S): Nplate What should I tell my care team before I take this medication? They need to know if you have any of these conditions: Blood clots Myelodysplastic syndrome An unusual or allergic reaction to romiplostim, mannitol, other medications, foods, dyes, or preservatives Pregnant or trying to get pregnant Breast-feeding How should I use this medication? This medication is injected under the skin. It is given by a care team in a hospital or clinic setting. A special MedGuide will be given to you before each treatment. Be sure to read this information carefully each time. Talk to your care team about the use of this medication in children. While it may be prescribed for children as young as newborns for selected conditions, precautions do apply. Overdosage: If you think you have taken too much of this medicine contact a poison control center or emergency room at once. NOTE: This medicine is only for you. Do not share this medicine with others. What if I miss a dose? Keep appointments for follow-up doses. It is important not to miss your dose. Call your care team if you are unable to keep an appointment. What may interact with this medication? Interactions are not expected. This list may not describe all possible interactions. Give your health care provider a list of all the medicines, herbs, non-prescription drugs, or dietary supplements you use. Also  tell them if you smoke, drink alcohol, or use illegal drugs. Some items may interact with your medicine. What should I watch for while using this medication? Visit your care team for regular checks on your progress. You may need blood work done while you are taking this medication. Your condition will be monitored carefully while you are receiving this medication. It is important not to miss any appointments. What side effects may I notice from receiving this medication? Side effects that you should report to your care team as soon as possible: Allergic reactions--skin rash, itching, hives, swelling of the face, lips, tongue, or throat Blood clot--pain, swelling, or warmth in the leg, shortness of breath, chest pain Side effects that usually do not require medical attention (report to your care team if they continue or are bothersome): Dizziness Joint pain Muscle pain Pain in the hands or feet Stomach pain Trouble sleeping This list may not describe all possible side effects. Call your doctor for medical advice about side effects. You may report side effects to FDA at 1-800-FDA-1088. Where should I keep my medication? This medication is given in a hospital or clinic. It will not be stored at home. NOTE: This sheet is a summary. It may not cover all possible information. If you have questions about this medicine, talk to your doctor, pharmacist, or health care provider.  2024 Elsevier/Gold Standard (2022-01-09 00:00:00)

## 2023-08-11 ENCOUNTER — Encounter (HOSPITAL_COMMUNITY): Payer: Self-pay

## 2023-08-11 ENCOUNTER — Emergency Department (HOSPITAL_COMMUNITY): Payer: Medicare Other

## 2023-08-11 ENCOUNTER — Other Ambulatory Visit: Payer: Self-pay

## 2023-08-11 ENCOUNTER — Inpatient Hospital Stay (HOSPITAL_COMMUNITY)
Admission: EM | Admit: 2023-08-11 | Discharge: 2023-08-17 | DRG: 177 | Disposition: A | Discharge status: Skilled Nursing Facility | Payer: Medicare Other | Source: Skilled Nursing Facility | Attending: Internal Medicine | Admitting: Internal Medicine

## 2023-08-11 ENCOUNTER — Observation Stay (HOSPITAL_COMMUNITY): Payer: Medicare Other

## 2023-08-11 DIAGNOSIS — Z87891 Personal history of nicotine dependence: Secondary | ICD-10-CM

## 2023-08-11 DIAGNOSIS — E785 Hyperlipidemia, unspecified: Secondary | ICD-10-CM | POA: Diagnosis present

## 2023-08-11 DIAGNOSIS — B9620 Unspecified Escherichia coli [E. coli] as the cause of diseases classified elsewhere: Secondary | ICD-10-CM | POA: Diagnosis present

## 2023-08-11 DIAGNOSIS — J690 Pneumonitis due to inhalation of food and vomit: Secondary | ICD-10-CM | POA: Diagnosis not present

## 2023-08-11 DIAGNOSIS — I10 Essential (primary) hypertension: Secondary | ICD-10-CM | POA: Diagnosis present

## 2023-08-11 DIAGNOSIS — K219 Gastro-esophageal reflux disease without esophagitis: Secondary | ICD-10-CM | POA: Diagnosis present

## 2023-08-11 DIAGNOSIS — R509 Fever, unspecified: Secondary | ICD-10-CM | POA: Diagnosis not present

## 2023-08-11 DIAGNOSIS — N390 Urinary tract infection, site not specified: Secondary | ICD-10-CM | POA: Diagnosis present

## 2023-08-11 DIAGNOSIS — Z888 Allergy status to other drugs, medicaments and biological substances status: Secondary | ICD-10-CM

## 2023-08-11 DIAGNOSIS — Z823 Family history of stroke: Secondary | ICD-10-CM

## 2023-08-11 DIAGNOSIS — D61818 Other pancytopenia: Secondary | ICD-10-CM | POA: Diagnosis present

## 2023-08-11 DIAGNOSIS — Z833 Family history of diabetes mellitus: Secondary | ICD-10-CM

## 2023-08-11 DIAGNOSIS — R0902 Hypoxemia: Principal | ICD-10-CM

## 2023-08-11 DIAGNOSIS — Z881 Allergy status to other antibiotic agents status: Secondary | ICD-10-CM

## 2023-08-11 DIAGNOSIS — Z800 Family history of malignant neoplasm of digestive organs: Secondary | ICD-10-CM

## 2023-08-11 DIAGNOSIS — D801 Nonfamilial hypogammaglobulinemia: Secondary | ICD-10-CM | POA: Diagnosis present

## 2023-08-11 DIAGNOSIS — Z9289 Personal history of other medical treatment: Secondary | ICD-10-CM

## 2023-08-11 DIAGNOSIS — Z66 Do not resuscitate: Secondary | ICD-10-CM | POA: Diagnosis present

## 2023-08-11 DIAGNOSIS — Z825 Family history of asthma and other chronic lower respiratory diseases: Secondary | ICD-10-CM

## 2023-08-11 DIAGNOSIS — R062 Wheezing: Secondary | ICD-10-CM

## 2023-08-11 DIAGNOSIS — S2249XA Multiple fractures of ribs, unspecified side, initial encounter for closed fracture: Secondary | ICD-10-CM | POA: Diagnosis present

## 2023-08-11 DIAGNOSIS — Z882 Allergy status to sulfonamides status: Secondary | ICD-10-CM

## 2023-08-11 DIAGNOSIS — Z87440 Personal history of urinary (tract) infections: Secondary | ICD-10-CM

## 2023-08-11 DIAGNOSIS — Z79899 Other long term (current) drug therapy: Secondary | ICD-10-CM

## 2023-08-11 DIAGNOSIS — C9001 Multiple myeloma in remission: Secondary | ICD-10-CM | POA: Diagnosis present

## 2023-08-11 DIAGNOSIS — Z7969 Long term (current) use of other immunomodulators and immunosuppressants: Secondary | ICD-10-CM

## 2023-08-11 DIAGNOSIS — R5081 Fever presenting with conditions classified elsewhere: Secondary | ICD-10-CM | POA: Diagnosis not present

## 2023-08-11 DIAGNOSIS — K449 Diaphragmatic hernia without obstruction or gangrene: Secondary | ICD-10-CM | POA: Diagnosis present

## 2023-08-11 DIAGNOSIS — Z8673 Personal history of transient ischemic attack (TIA), and cerebral infarction without residual deficits: Secondary | ICD-10-CM

## 2023-08-11 DIAGNOSIS — F0150 Vascular dementia without behavioral disturbance: Secondary | ICD-10-CM | POA: Diagnosis present

## 2023-08-11 DIAGNOSIS — Z8579 Personal history of other malignant neoplasms of lymphoid, hematopoietic and related tissues: Secondary | ICD-10-CM

## 2023-08-11 DIAGNOSIS — E039 Hypothyroidism, unspecified: Secondary | ICD-10-CM | POA: Diagnosis present

## 2023-08-11 DIAGNOSIS — J9811 Atelectasis: Secondary | ICD-10-CM | POA: Diagnosis present

## 2023-08-11 DIAGNOSIS — M8458XG Pathological fracture in neoplastic disease, other specified site, subsequent encounter for fracture with delayed healing: Secondary | ICD-10-CM | POA: Diagnosis present

## 2023-08-11 DIAGNOSIS — G3184 Mild cognitive impairment, so stated: Secondary | ICD-10-CM | POA: Diagnosis present

## 2023-08-11 DIAGNOSIS — Z1152 Encounter for screening for COVID-19: Secondary | ICD-10-CM

## 2023-08-11 DIAGNOSIS — Z808 Family history of malignant neoplasm of other organs or systems: Secondary | ICD-10-CM

## 2023-08-11 DIAGNOSIS — Z8249 Family history of ischemic heart disease and other diseases of the circulatory system: Secondary | ICD-10-CM

## 2023-08-11 DIAGNOSIS — J9611 Chronic respiratory failure with hypoxia: Secondary | ICD-10-CM

## 2023-08-11 DIAGNOSIS — Z79890 Hormone replacement therapy (postmenopausal): Secondary | ICD-10-CM

## 2023-08-11 DIAGNOSIS — E876 Hypokalemia: Secondary | ICD-10-CM | POA: Diagnosis present

## 2023-08-11 DIAGNOSIS — J9621 Acute and chronic respiratory failure with hypoxia: Secondary | ICD-10-CM | POA: Diagnosis present

## 2023-08-11 DIAGNOSIS — Z9481 Bone marrow transplant status: Secondary | ICD-10-CM

## 2023-08-11 LAB — CBC WITH DIFFERENTIAL/PLATELET
Abs Immature Granulocytes: 0.41 10*3/uL — ABNORMAL HIGH (ref 0.00–0.07)
Basophils Absolute: 0 10*3/uL (ref 0.0–0.1)
Basophils Relative: 0 %
Eosinophils Absolute: 0 10*3/uL (ref 0.0–0.5)
Eosinophils Relative: 1 %
HCT: 26.6 % — ABNORMAL LOW (ref 39.0–52.0)
Hemoglobin: 8.8 g/dL — ABNORMAL LOW (ref 13.0–17.0)
Immature Granulocytes: 9 %
Lymphocytes Relative: 7 %
Lymphs Abs: 0.3 10*3/uL — ABNORMAL LOW (ref 0.7–4.0)
MCH: 37.8 pg — ABNORMAL HIGH (ref 26.0–34.0)
MCHC: 33.1 g/dL (ref 30.0–36.0)
MCV: 114.2 fL — ABNORMAL HIGH (ref 80.0–100.0)
Monocytes Absolute: 0.6 10*3/uL (ref 0.1–1.0)
Monocytes Relative: 13 %
Neutro Abs: 3.2 10*3/uL (ref 1.7–7.7)
Neutrophils Relative %: 70 %
Platelets: 21 10*3/uL — CL (ref 150–400)
RBC: 2.33 MIL/uL — ABNORMAL LOW (ref 4.22–5.81)
RDW: 15.6 % — ABNORMAL HIGH (ref 11.5–15.5)
WBC: 4.6 10*3/uL (ref 4.0–10.5)
nRBC: 0.7 % — ABNORMAL HIGH (ref 0.0–0.2)

## 2023-08-11 LAB — URINALYSIS, W/ REFLEX TO CULTURE (INFECTION SUSPECTED)
Bilirubin Urine: NEGATIVE
Glucose, UA: NEGATIVE mg/dL
Ketones, ur: NEGATIVE mg/dL
Nitrite: NEGATIVE
Protein, ur: NEGATIVE mg/dL
Specific Gravity, Urine: 1.035 — ABNORMAL HIGH (ref 1.005–1.030)
pH: 5 (ref 5.0–8.0)

## 2023-08-11 LAB — FERRITIN: Ferritin: 115 ng/mL (ref 24–336)

## 2023-08-11 LAB — COMPREHENSIVE METABOLIC PANEL
ALT: 10 U/L (ref 0–44)
AST: 19 U/L (ref 15–41)
Albumin: 3.3 g/dL — ABNORMAL LOW (ref 3.5–5.0)
Alkaline Phosphatase: 64 U/L (ref 38–126)
Anion gap: 9 (ref 5–15)
BUN: 12 mg/dL (ref 8–23)
CO2: 25 mmol/L (ref 22–32)
Calcium: 8.9 mg/dL (ref 8.9–10.3)
Chloride: 100 mmol/L (ref 98–111)
Creatinine, Ser: 0.87 mg/dL (ref 0.61–1.24)
GFR, Estimated: >60 mL/min (ref >60)
Glucose, Bld: 100 mg/dL — ABNORMAL HIGH (ref 70–99)
Potassium: 3.6 mmol/L (ref 3.5–5.1)
Sodium: 134 mmol/L — ABNORMAL LOW (ref 135–145)
Total Bilirubin: 0.5 mg/dL (ref <1.2)
Total Protein: 6.2 g/dL — ABNORMAL LOW (ref 6.5–8.1)

## 2023-08-11 LAB — SARS CORONAVIRUS 2 BY RT PCR: SARS Coronavirus 2 by RT PCR: NEGATIVE

## 2023-08-11 LAB — SEDIMENTATION RATE: Sed Rate: 70 mm/h — ABNORMAL HIGH (ref 0–16)

## 2023-08-11 MED ORDER — MIDODRINE HCL 2.5 MG PO TABS
2.5000 mg | ORAL_TABLET | Freq: Two times a day (BID) | ORAL | Status: DC
  Administered 2023-08-12 – 2023-08-17 (×11): 2.5 mg via ORAL
  Filled 2023-08-11 (×12): qty 1

## 2023-08-11 MED ORDER — DAPSONE 100 MG PO TABS
100.0000 mg | ORAL_TABLET | Freq: Every day | ORAL | Status: DC
  Administered 2023-08-11 – 2023-08-17 (×7): 100 mg via ORAL
  Filled 2023-08-11 (×7): qty 1

## 2023-08-11 MED ORDER — IOHEXOL 350 MG/ML SOLN
75.0000 mL | Freq: Once | INTRAVENOUS | Status: AC | PRN
  Administered 2023-08-11: 75 mL via INTRAVENOUS

## 2023-08-11 MED ORDER — LEVOTHYROXINE SODIUM 125 MCG PO TABS
125.0000 ug | ORAL_TABLET | Freq: Every day | ORAL | Status: DC
  Administered 2023-08-12 – 2023-08-17 (×6): 125 ug via ORAL
  Filled 2023-08-11 (×6): qty 1

## 2023-08-11 MED ORDER — METHYLPHENIDATE HCL 5 MG PO TABS: 2.5000 mg | ORAL_TABLET | Freq: Every morning | ORAL | Status: DC

## 2023-08-11 MED ORDER — ALBUTEROL SULFATE (2.5 MG/3ML) 0.083% IN NEBU: 2.5000 mg | INHALATION_SOLUTION | RESPIRATORY_TRACT | Status: DC | PRN

## 2023-08-11 MED ORDER — ACETAMINOPHEN 325 MG PO TABS
650.0000 mg | ORAL_TABLET | Freq: Four times a day (QID) | ORAL | Status: DC | PRN
  Administered 2023-08-11 – 2023-08-17 (×7): 650 mg via ORAL
  Filled 2023-08-11 (×7): qty 2

## 2023-08-11 MED ORDER — FOLIC ACID 1 MG PO TABS
1.0000 mg | ORAL_TABLET | Freq: Every day | ORAL | Status: DC
  Administered 2023-08-12 – 2023-08-17 (×6): 1 mg via ORAL
  Filled 2023-08-11 (×6): qty 1

## 2023-08-11 MED ORDER — VALACYCLOVIR HCL 500 MG PO TABS
500.0000 mg | ORAL_TABLET | Freq: Every day | ORAL | Status: DC
  Administered 2023-08-12 – 2023-08-17 (×6): 500 mg via ORAL
  Filled 2023-08-11 (×6): qty 1

## 2023-08-11 MED ORDER — VITAMIN B-12 1000 MCG PO TABS
1000.0000 ug | ORAL_TABLET | Freq: Every day | ORAL | Status: DC
  Administered 2023-08-12 – 2023-08-17 (×6): 1000 ug via ORAL
  Filled 2023-08-11 (×6): qty 1

## 2023-08-11 MED ORDER — TAMSULOSIN HCL 0.4 MG PO CAPS
0.4000 mg | ORAL_CAPSULE | Freq: Every day | ORAL | Status: DC
  Administered 2023-08-11 – 2023-08-16 (×6): 0.4 mg via ORAL
  Filled 2023-08-11 (×6): qty 1

## 2023-08-11 MED ORDER — FERROUS SULFATE 325 (65 FE) MG PO TABS
325.0000 mg | ORAL_TABLET | Freq: Two times a day (BID) | ORAL | Status: DC
  Administered 2023-08-11 – 2023-08-17 (×13): 325 mg via ORAL
  Filled 2023-08-11 (×13): qty 1

## 2023-08-11 MED ORDER — ACETAMINOPHEN 650 MG RE SUPP: 650.0000 mg | Freq: Four times a day (QID) | RECTAL | Status: DC | PRN

## 2023-08-11 MED ORDER — ESCITALOPRAM OXALATE 10 MG PO TABS
10.0000 mg | ORAL_TABLET | Freq: Every day | ORAL | Status: DC
  Administered 2023-08-11 – 2023-08-17 (×7): 10 mg via ORAL
  Filled 2023-08-11 (×7): qty 1

## 2023-08-11 MED ORDER — TRAZODONE HCL 50 MG PO TABS: 25.0000 mg | ORAL_TABLET | Freq: Every evening | ORAL | Status: DC | PRN

## 2023-08-11 MED ORDER — METHYLPHENIDATE HCL 5 MG PO TABS
2.5000 mg | ORAL_TABLET | Freq: Every day | ORAL | Status: DC
  Administered 2023-08-12 – 2023-08-17 (×6): 2.5 mg via ORAL
  Filled 2023-08-11 (×6): qty 1

## 2023-08-11 MED ORDER — TRAMADOL HCL 50 MG PO TABS: 50.0000 mg | ORAL_TABLET | Freq: Two times a day (BID) | ORAL | Status: DC | PRN

## 2023-08-11 NOTE — Progress Notes (Signed)
       Overnight   NAME: Andre [name] MRN: [id number] DOB : May 13, 1951    Date of Service   08/11/2023   HPI/Events of Note    Notified by RN for unwitnessed fall.  Bedside visit: Patient states that he got up out of bed unassisted to retrieve an item from the room couch. Patient states that he slipped and fell backward hitting his head on an unknown surface.  No headache, no loss of vision, no ringing in the ears No obvious or stated injuries.  No deformities, contusions, abrasions, punctures, bruising, tears, lacerations, swelling. Movement of extremities at his stated baseline x 4. Strength at his stated baseline x 4  Imaging:  "FINDINGS: Brain: No evidence of acute infarction, hemorrhage, mass, mass effect, or midline shift. No hydrocephalus or extra-axial fluid collection. Periventricular white matter changes, likely the sequela of chronic small vessel ischemic disease. Redemonstrated remote left cerebellar infarct.   Vascular: No hyperdense vessel.   Skull: Negative for fracture. Redemonstrated lucencies in the calvarium, consistent with the patient's history of multiple myeloma.   Sinuses/Orbits: Mucosal thickening in the ethmoid air cells. No acute finding in the orbits.   Other: The mastoid air cells are well aerated.   IMPRESSION: No acute intracranial process.     Electronically Signed   By: Wiliam Ke M.D.   On: 08/11/2023 22:20 "    Interventions/ Plan   Fall arresting pads Reinforce call bell use Continue previous orders Continue neurochecks per protocol.      Chinita Greenland BSN MSNA MSN ACNPC-AG Acute Care Nurse Practitioner [facility]

## 2023-08-11 NOTE — ED Provider Notes (Signed)
[facility] AT [facility] Provider Note   CSN: [id number] Arrival date & time: 08/11/23  1048     History  Chief Complaint  Patient presents with   Fever   Shortness of Breath         Andre [name] is a 72 y.o. male.  Patient is a 72 year old male with a past medical history of multiple myeloma in remission, drug-induced pancytopenia, hypothyroidism, GERD, cognitive impairment presenting to the emergency department with fever and hypoxia.  Per EMS, patient had fever at his nursing home this morning and was found to be hypoxic on room air.  They state normally does not need any oxygen.  Was given Tylenol prehospital.  The patient reports that he has been having fevers for at least the last 2 days.  He denies any cough, congestion or sore throat.  He states that he vomited 2 days ago but has not had any nausea since.  He denies any associated abdominal pain or diarrhea, dysuria or hematuria.  He denies any rash.  The history is provided by the patient, the EMS personnel and medical records.  Fever Shortness of Breath Associated symptoms: fever        Home Medications Prior to Admission medications   Medication Sig Start Date End Date Taking? Authorizing Provider  acetaminophen (TYLENOL) 500 MG tablet Take 2 tablets (1,000 mg total) by mouth every 8 (eight) hours. 06/20/23   Marguerita Merles Latif, DO  calcium carbonate (TUMS EX) 750 MG chewable tablet Chew 2 tablets by mouth 3 (three) times daily as needed (for indigestion).    [provider]  Cholecalciferol (VITAMIN D3) 25 MCG (1000 UT) capsule Take 1,000 Units by mouth daily.    [provider]  cyanocobalamin (VITAMIN B12) 1000 MCG tablet Take 1 tablet (1,000 mcg total) by mouth daily. 04/09/23   Rana Snare, NP  dapsone 100 MG tablet Take 100 mg by mouth daily.    Ladene Artist, MD  docusate sodium (COLACE) 100 MG capsule Take 100 mg by mouth daily as needed (for  constipation).    [provider]  escitalopram (LEXAPRO) 10 MG tablet Take 1 tablet (10 mg total) by mouth daily. 06/27/22   [name], Georgina Quint, MD  ferrous sulfate 325 (65 FE) MG EC tablet Take 325 mg by mouth 2 (two) times daily.    [provider]  folic acid (FOLVITE) 1 MG tablet Take 1 tablet (1 mg total) by mouth daily. 04/09/23   Rana Snare, NP  levothyroxine (SYNTHROID) 125 MCG tablet Take 125 mcg by mouth daily before breakfast. 04/12/23   [provider]  Menthol-Methyl Salicylate (MUSCLE RUB) 10-15 % CREA Apply 1 Application topically as needed for muscle pain. 06/20/23   Marguerita Merles Latif, DO  methylphenidate (RITALIN) 5 MG tablet Take 0.5 tablets (2.5 mg total) by mouth every morning. 07/26/23   Fletcher Anon, NP  midodrine (PROAMATINE) 2.5 MG tablet Take 1 tablet (2.5 mg total) by mouth 2 (two) times daily with a meal. 07/12/23   Fletcher Anon, NP  ondansetron (ZOFRAN) 4 MG tablet Take 4 mg by mouth every 8 (eight) hours as needed for nausea or vomiting.    [provider]  pantoprazole (PROTONIX) 40 MG tablet Take 1 tablet (40 mg total) by mouth daily. 04/20/23   Fletcher Anon, NP  Probiotic Product (UP4 PROBIOTICS WOMENS PO) Take 1 capsule by mouth daily.    [provider]  tamsulosin (FLOMAX) 0.4  MG CAPS capsule Take 0.4 mg by mouth daily. 06/29/23   [provider]  traMADol (ULTRAM) 50 MG tablet Take 25 mg by mouth every 12 (twelve) hours as needed.    [provider]  valACYclovir (VALTREX) 500 MG tablet Take 500 mg by mouth daily.    [provider]  prochlorperazine (COMPAZINE) 10 MG tablet Take 1 tablet (10 mg total) by mouth every 6 (six) hours as needed (Nausea or vomiting). 12/14/20 12/12/21  Magrinat, Valentino Hue, MD      Allergies    Atorvastatin, Rosuvastatin, Beef-derived products, Chicken protein, Pork-derived products, and Septra [sulfamethoxazole-trimethoprim]    Review of Systems    Review of Systems  Constitutional:  Positive for fever.  Respiratory:  Positive for shortness of breath.     Physical Exam Updated Vital Signs BP 110/61   Pulse 75   Temp 98.2 F (36.8 C) (Oral)   Resp 20   Ht 5\' 7"  (1.702 m)   Wt 80 kg   SpO2 94%   BMI 27.63 kg/m  Physical Exam Vitals and nursing note reviewed.  Constitutional:      General: He is not in acute distress.    Appearance: He is well-developed.  HENT:     Head: Normocephalic and atraumatic.     Mouth/Throat:     Mouth: Mucous membranes are moist.  Eyes:     Extraocular Movements: Extraocular movements intact.  Cardiovascular:     Rate and Rhythm: Normal rate and regular rhythm.     Heart sounds: Normal heart sounds.  Pulmonary:     Effort: Pulmonary effort is normal.     Breath sounds: Examination of the right-lower field reveals rhonchi. Examination of the left-lower field reveals rhonchi. Rhonchi present.  Abdominal:     Palpations: Abdomen is soft.     Tenderness: There is no abdominal tenderness.  Musculoskeletal:        General: Normal range of motion.     Cervical back: Normal range of motion and neck supple.     Right lower leg: No edema.     Left lower leg: No edema.  Skin:    General: Skin is warm and dry.  Neurological:     General: No focal deficit present.     Mental Status: He is alert.  Psychiatric:        Mood and Affect: Mood normal.        Behavior: Behavior normal.     ED Results / Procedures / Treatments   Labs (all labs ordered are listed, but only abnormal results are displayed) Labs Reviewed  CBC WITH DIFFERENTIAL/PLATELET - Abnormal; Notable for the following components:      Result Value   RBC 2.33 (*)    Hemoglobin 8.8 (*)    HCT 26.6 (*)    MCV 114.2 (*)    MCH 37.8 (*)    RDW 15.6 (*)    Platelets 21 (*)    nRBC 0.7 (*)    Lymphs Abs 0.3 (*)    Abs Immature Granulocytes 0.41 (*)    All other components within normal limits  COMPREHENSIVE METABOLIC PANEL -  Abnormal; Notable for the following components:   Sodium 134 (*)    Glucose, Bld 100 (*)    Total Protein 6.2 (*)    Albumin 3.3 (*)    All other components within normal limits  SARS CORONAVIRUS 2 BY RT PCR  URINALYSIS, W/ REFLEX TO CULTURE (INFECTION SUSPECTED)    EKG  None  Radiology CT Angio Chest PE W/Cm &/Or Wo Cm  Result Date: 08/11/2023 CLINICAL DATA:  72 year old male with fever and body ache. Undergoing chemotherapy for multiple myeloma. EXAM: CT ANGIOGRAPHY CHEST WITH CONTRAST TECHNIQUE: Multidetector CT imaging of the chest was performed using the standard protocol during bolus administration of intravenous contrast. Multiplanar CT image reconstructions and MIPs were obtained to evaluate the vascular anatomy. RADIATION DOSE REDUCTION: This exam was performed according to the departmental dose-optimization program which includes automated exposure control, adjustment of the mA and/or kV according to patient size and/or use of iterative reconstruction technique. CONTRAST:  75mL OMNIPAQUE IOHEXOL 350 MG/ML SOLN COMPARISON:  Portable chest 1123 hours today. Previous chest CTA 05/20/2023. Recent chest CT 08/05/2023. FINDINGS: Cardiovascular: Good contrast bolus timing in the pulmonary arterial tree. No pulmonary artery filling defect identified. Calcified aortic atherosclerosis. Calcified coronary artery atherosclerosis. Heart size within normal limits. No pericardial effusion. Mediastinum/Nodes: Moderate to large gastric hiatal hernia. No mediastinal lymphadenopathy. Lungs/Pleura: Low lung volumes superimposed on chronic gastric hiatal hernia and elevated right hemidiaphragm. Confluent bilateral lower lobe atelectasis. Central airways remain patent. No convincing acute pneumonia, acute lung inflammation. Upper Abdomen: Stable, negative visible mostly noncontrast upper abdominal viscera. Musculoskeletal: Generalized bone mineralization heterogeneity of multiple myeloma. Chronic T12 compression  fracture. Numerous chronic rib fractures. Un healed pathologic fractures of the lateral left 7th and 8th ribs, similar to recent chest CT. Unhealed posterior left 9th rib fracture is mildly more displaced from the recent CT. No brand new osseous abnormality is identified. Review of the MIP images confirms the above findings. IMPRESSION: 1. Negative for acute pulmonary embolus. 2. Low lung volumes with confluent atelectasis, in part due to chronic hiatal hernia on the left. No pleural effusion or convincing pneumonia. 3. Multiple myeloma. Un-healed pathologic fractures of the left ribs 7 through 9, slightly more displaced since 08/05/2023. 4. Aortic Atherosclerosis (ICD10-I70.0). Coronary artery atherosclerosis. Electronically Signed   By: Odessa Fleming M.D.   On: 08/11/2023 12:55   DG Chest Port 1 View  Result Date: 08/11/2023 CLINICAL DATA:  Hypoxia and fever.  History of multiple myeloma. EXAM: PORTABLE CHEST 1 VIEW COMPARISON:  08/04/2023 FINDINGS: Stable cardiomediastinal contours. Unchanged asymmetric elevation of the right hemidiaphragm. No pleural effusion, airspace consolidation, or pneumothorax. No signs of interstitial edema. Multiple remote healed posterior and lateral right rib fractures, no acute osseous findings noted. IMPRESSION: No acute cardiopulmonary abnormalities. Unchanged chronic asymmetric  elevation of the right hemidiaphragm. Electronically Signed   By: Signa Kell M.D.   On: 08/11/2023 11:36    Procedures Procedures    Medications Ordered in ED Medications  iohexol (OMNIPAQUE) 350 MG/ML injection 75 mL (75 mLs Intravenous Contrast Given 08/11/23 1216)    ED Course/ Medical Decision Making/ A&P Clinical Course as of 08/11/23 1355  Sat Aug 11, 2023  1318 Pancytopenia at baseline, no worsening leukopenia or neutropenia.  [VK]  1319 No PE on CTPE, atelectasis but no acute abnormality to explain hypoxia. [VK]  1353 Attempted to turn off O2 with desaturation to 91%. Will be  admitted for hypoxia and fever of unknown source in his immunocompromised state. [VK]    Clinical Course User Index [VK] Rexford Maus, DO                                 Medical Decision Making This patient presents to the ED with chief complaint(s) of fever, hypoxia with pertinent past medical  history of  multiple myeloma in remission, drug-induced pancytopenia, hypothyroidism, GERD, cognitive impairment which further complicates the presenting complaint. The complaint involves an extensive differential diagnosis and also carries with it a high risk of complications and morbidity.    The differential diagnosis includes sepsis, pneumonia, pneumothorax, pulmonary edema, pleural effusion, dehydration, electro abnormality, anemia, arrhythmia considering PE with cancer history and recent hospitalization  Additional history obtained: Additional history obtained from EMS  Records reviewed Nursing Home Documents and previous admission records, oncology records  ED Course and Reassessment: On patient's arrival he is hemodynamically stable in no acute distress, satting well on 2 L of oxygen.  Did have some mild crackles at the base but is in no respiratory distress.  Patient will have chest x-ray and labs as well as CT PE study to evaluate for cause of his hypoxia and recurrent fevers.  Does appear that he was just admitted for similar symptoms and was planned for outpatient ID follow-up for his recurrent fevers.  Independent labs interpretation:  The following labs were independently interpreted: pancytopenia at baseline  Independent visualization of imaging: - I independently visualized the following imaging with scope of interpretation limited to determining acute life threatening conditions related to emergency care: CXR, CTPE, which revealed no acute disease  Consultation: - Consulted or discussed management/test interpretation w/ external professional: hospitalist  Consideration for  admission or further workup: patient requires admission for hypoxia and fever of unknown source Social Determinants of health: N/A    Amount and/or Complexity of Data Reviewed Labs: ordered. Radiology: ordered.  Risk Prescription drug management. Decision regarding hospitalization.          Final Clinical Impression(s) / ED Diagnoses Final diagnoses:  Hypoxia  Fever, unspecified fever cause    Rx / DC Orders ED Discharge Orders     None         Rexford Maus, DO 08/11/23 1355

## 2023-08-11 NOTE — ED Triage Notes (Signed)
Pt coming from [facility] assisted living. Pt experiencing a fever, and body aches for 2x days. Pt has hx of cancer, and currently being treated with chemo. 1000mg  of tylenol given by facility prior to coming to the ER. Seen on the 16th for similar complaint. A&O 3x, staff said pt is at his baseline. 88% on Room air and was placed on 4L nasal cannula. Normally does not wear oxygen.

## 2023-08-11 NOTE — ED Notes (Signed)
ED TO INPATIENT HANDOFF REPORT  Name/Age/Gender Andre [name] 72 y.o. male  Code Status    Code Status Orders  (From admission, onward)           Start     Ordered   08/11/23 1455  Do not attempt resuscitation (DNR)- Limited -Do Not Intubate (DNI)  Continuous       Question Answer Comment  If pulseless and not breathing No CPR or chest compressions.   In Pre-Arrest Conditions (Patient Is Breathing and Has A Pulse) Do not intubate. Provide all appropriate non-invasive medical interventions. Avoid ICU transfer unless indicated or required.   Consent: Discussion documented in EHR or advanced directives reviewed      08/11/23 1455           Code Status History     Date Active Date Inactive Code Status Order ID Comments User Context   08/04/2023 1516 08/07/2023 1625 Limited: Do not attempt resuscitation (DNR) -DNR-LIMITED -Do Not Intubate/DNI  [id number]  Bobette Mo, MD ED   06/14/2023 1405 06/20/2023 1942 Limited: Do not attempt resuscitation (DNR) -DNR-LIMITED -Do Not Intubate/DNI  [id number]  Maryln Gottron, MD ED   05/20/2023 2130 05/25/2023 1614 Limited: Do not attempt resuscitation (DNR) -DNR-LIMITED -Do Not Intubate/DNI  [id number]  Carollee Herter, DO ED   05/20/2023 2045 05/20/2023 2130 Limited: Do not attempt resuscitation (DNR) -DNR-LIMITED -Do Not Intubate/DNI  [id number]  Carollee Herter, DO ED   05/05/2023 2318 05/11/2023 1834 DNR [id number]  Tomma Lightning, MD ED   05/05/2023 1715 05/05/2023 2318 DNR [id number]  Glyn Ade, MD ED   02/26/2016 2354 03/01/2016 1547 Full Code [id number]  Therisa Doyne, MD Inpatient   12/23/2015 0834 12/26/2015 1754 Full Code [id number]  Magrinat, Valentino Hue, MD Inpatient   12/22/2015 2232 12/23/2015 0833 DNR [id number]  Therisa Doyne, MD Inpatient   11/07/2015 1903 11/09/2015 1802 Full Code [id number]  Briscoe Deutscher, MD Inpatient      Advance Directive Documentation    Flowsheet Row Most Recent Value  Type of Advance Directive Out of  facility DNR (pink MOST or yellow form)  Pre-existing out of facility DNR order (yellow form or pink MOST form) --  "MOST" Form in Place? --       Home/SNF/Other Home  Chief Complaint Fever [R50.9]  Level of Care/Admitting Diagnosis ED Disposition     ED Disposition  Admit   Condition  --   Comment  Hospital Area: [facility] [[id number]]  Level of Care: Med-Surg [16]  May place patient in observation at [facility] or [facility] if equivalent level of care is available:: Yes  Covid Evaluation: Confirmed COVID Negative  Diagnosis: Fever [344092]  Admitting Physician: Maryln Gottron [[id number]]  Attending Physician: Kirby Crigler, MIR [email or web address] [[id number]]          Medical History Past Medical History:  Diagnosis Date   Anemia    Arthritis    Asthma    no treatment x 20 years   Depression    Double vision    occurs at times    Duodenal ulcer    GERD (gastroesophageal reflux disease)    Hemorrhage of rectum and anus 09/15/2013   Hyperlipidemia    Hypertension    Hypothyroidism    Leukopenia due to antineoplastic chemotherapy (HCC) 02/22/2017   Multiple myeloma 07/04/2011   Thyroid disease    TIA (transient ischemic attack) 03/27/2012   Transient diplopia 03/27/2012   Transient global amnesia 06/07/2015    Allergies  Allergies  Allergen Reactions   Atorvastatin Other (See Comments)    (LIPITOR) "Allergic," per MAR   Rosuvastatin Other (See Comments)    (CRESTOR) Liver Disorder (adverse effects) and "Allergic," per [facility]   Beef-Derived Products Other (See Comments)    pescatarian    Chicken Protein Other (See Comments)    pescatarian   Pork-Derived Products Other (See Comments)    pescatarian    Septra [Sulfamethoxazole-Trimethoprim] Rash and Other (See Comments)    "Allergic," per [facility]    IV Location/Drains/Wounds Patient Lines/Drains/Airways Status     Active Line/Drains/Airways     Name Placement date Placement time Site Days    Peripheral IV 08/11/23 20 G 1" Anterior;Proximal;Right Forearm 08/11/23  1140  Forearm  less than 1            Labs/Imaging Results for orders placed or performed during the hospital encounter of 08/11/23 (from the past 48 hour(s))  CBC with Differential     Status: Abnormal   Collection Time: 08/11/23 11:18 AM  Result Value Ref Range   WBC 4.6 4.0 - 10.5 K/uL   RBC 2.33 (L) 4.22 - 5.81 MIL/uL   Hemoglobin 8.8 (L) 13.0 - 17.0 g/dL   HCT 54.0 (L) 98.1 - 19.1 %   MCV 114.2 (H) 80.0 - 100.0 fL   MCH 37.8 (H) 26.0 - 34.0 pg   MCHC 33.1 30.0 - 36.0 g/dL   RDW 47.8 (H) 29.5 - 62.1 %   Platelets 21 (LL) 150 - 400 K/uL    Comment: SPECIMEN CHECKED FOR CLOTS REPEATED TO VERIFY PLATELET COUNT CONFIRMED BY SMEAR THIS CRITICAL RESULT HAS VERIFIED AND BEEN CALLED TO LEWIS, M BY LINDSAY,CELESTE ON 11 23 2024 AT 1233, AND HAS BEEN READ BACK.     nRBC 0.7 (H) 0.0 - 0.2 %   Neutrophils Relative % 70 %   Neutro Abs 3.2 1.7 - 7.7 K/uL   Lymphocytes Relative 7 %   Lymphs Abs 0.3 (L) 0.7 - 4.0 K/uL   Monocytes Relative 13 %   Monocytes Absolute 0.6 0.1 - 1.0 K/uL   Eosinophils Relative 1 %   Eosinophils Absolute 0.0 0.0 - 0.5 K/uL   Basophils Relative 0 %   Basophils Absolute 0.0 0.0 - 0.1 K/uL   Immature Granulocytes 9 %   Abs Immature Granulocytes 0.41 (H) 0.00 - 0.07 K/uL    Comment: Performed at [facility], [address] [place], [place] [address]  Comprehensive metabolic panel     Status: Abnormal   Collection Time: 08/11/23 11:18 AM  Result Value Ref Range   Sodium 134 (L) 135 - 145 mmol/L   Potassium 3.6 3.5 - 5.1 mmol/L   Chloride 100 98 - 111 mmol/L   CO2 25 22 - 32 mmol/L   Glucose, Bld 100 (H) 70 - 99 mg/dL    Comment: Glucose reference range applies only to samples taken after fasting for at least 8 hours.   BUN 12 8 - 23 mg/dL   Creatinine, Ser 7.84 0.61 - 1.24 mg/dL   Calcium 8.9 8.9 - 69.6 mg/dL   Total Protein 6.2 (L) 6.5 - 8.1 g/dL   Albumin  3.3 (L) 3.5 - 5.0 g/dL   AST 19 15 - 41 U/L   ALT 10 0 - 44 U/L   Alkaline Phosphatase 64 38 - 126 U/L   Total Bilirubin 0.5 <1.2 mg/dL   GFR, Estimated >29 >52 mL/min    Comment: (NOTE) Calculated using the CKD-EPI Creatinine  Equation (2021)    Anion gap 9 5 - 15    Comment: Performed at [facility], [address], [place], [place] [address]  SARS Coronavirus 2 by RT PCR (hospital order, performed in [facility] hospital lab) *cepheid single result test* Anterior Nasal Swab     Status: None   Collection Time: 08/11/23 11:18 AM   Specimen: Anterior Nasal Swab  Result Value Ref Range   SARS Coronavirus 2 by RT PCR NEGATIVE NEGATIVE    Comment: (NOTE) SARS-CoV-2 target nucleic acids are NOT DETECTED.  The SARS-CoV-2 RNA is generally detectable in upper and lower respiratory specimens during the acute phase of infection. The lowest concentration of SARS-CoV-2 viral copies this assay can detect is 250 copies / mL. A negative result does not preclude SARS-CoV-2 infection and should not be used as the sole basis for treatment or other patient management decisions.  A negative result may occur with improper specimen collection / handling, submission of specimen other than nasopharyngeal swab, presence of viral mutation(s) within the areas targeted by this assay, and inadequate number of viral copies (<250 copies / mL). A negative result must be combined with clinical observations, patient history, and epidemiological information.  Fact Sheet for Patients:   RoadLapTop.co.za  Fact Sheet for Healthcare Providers: http://kim-miller.com/  This test is not yet approved or  cleared by the [place] FDA and has been authorized for detection and/or diagnosis of SARS-CoV-2 by FDA under an Emergency Use Authorization (EUA).  This EUA will remain in effect (meaning this test can be used) for the duration of the COVID-19  declaration under Section 564(b)(1) of the Act, 21 U.S.C. section 360bbb-3(b)(1), unless the authorization is terminated or revoked sooner.  Performed at [facility], [address] [place], [place] [address]   Urinalysis, w/ Reflex to Culture (Infection Suspected) -Urine, Clean Catch     Status: Abnormal   Collection Time: 08/11/23  1:29 PM  Result Value Ref Range   Specimen Source URINE, CLEAN CATCH    Color, Urine YELLOW YELLOW   APPearance HAZY (A) CLEAR   Specific Gravity, Urine 1.035 (H) 1.005 - 1.030   pH 5.0 5.0 - 8.0   Glucose, UA NEGATIVE NEGATIVE mg/dL   Hgb urine dipstick SMALL (A) NEGATIVE   Bilirubin Urine NEGATIVE NEGATIVE   Ketones, ur NEGATIVE NEGATIVE mg/dL   Protein, ur NEGATIVE NEGATIVE mg/dL   Nitrite NEGATIVE NEGATIVE   Leukocytes,Ua MODERATE (A) NEGATIVE   RBC / HPF 11-20 0 - 5 RBC/hpf   WBC, UA 21-50 0 - 5 WBC/hpf    Comment:        Reflex urine culture not performed if WBC <=10, OR if Squamous epithelial cells >5. If Squamous epithelial cells >5 suggest recollection.    Bacteria, UA RARE (A) NONE SEEN   Squamous Epithelial / HPF 0-5 0 - 5 /HPF   WBC Clumps PRESENT    Mucus PRESENT     Comment: Performed at [facility], [address] [place], [place] [address]   *Note: Due to a large number of results and/or encounters for the requested time period, some results have not been displayed. A complete set of results can be found in Results Review.   CT Angio Chest PE W/Cm &/Or Wo Cm  Result Date: 08/11/2023 CLINICAL DATA:  72 year old male with fever and body ache. Undergoing chemotherapy for multiple myeloma. EXAM: CT ANGIOGRAPHY CHEST WITH CONTRAST TECHNIQUE: Multidetector CT imaging of the chest was  performed using the standard protocol during bolus administration of intravenous contrast. Multiplanar CT image reconstructions and MIPs were obtained to evaluate the vascular anatomy. RADIATION DOSE REDUCTION: This  exam was performed according to the departmental dose-optimization program which includes automated exposure control, adjustment of the mA and/or kV according to patient size and/or use of iterative reconstruction technique. CONTRAST:  75mL OMNIPAQUE IOHEXOL 350 MG/ML SOLN COMPARISON:  Portable chest 1123 hours today. Previous chest CTA 05/20/2023. Recent chest CT 08/05/2023. FINDINGS: Cardiovascular: Good contrast bolus timing in the pulmonary arterial tree. No pulmonary artery filling defect identified. Calcified aortic atherosclerosis. Calcified coronary artery atherosclerosis. Heart size within normal limits. No pericardial effusion. Mediastinum/Nodes: Moderate to large gastric hiatal hernia. No mediastinal lymphadenopathy. Lungs/Pleura: Low lung volumes superimposed on chronic gastric hiatal hernia and elevated right hemidiaphragm. Confluent bilateral lower lobe atelectasis. Central airways remain patent. No convincing acute pneumonia, acute lung inflammation. Upper Abdomen: Stable, negative visible mostly noncontrast upper abdominal viscera. Musculoskeletal: Generalized bone mineralization heterogeneity of multiple myeloma. Chronic T12 compression fracture. Numerous chronic rib fractures. Un healed pathologic fractures of the lateral left 7th and 8th ribs, similar to recent chest CT. Unhealed posterior left 9th rib fracture is mildly more displaced from the recent CT. No brand new osseous abnormality is identified. Review of the MIP images confirms the above findings. IMPRESSION: 1. Negative for acute pulmonary embolus. 2. Low lung volumes with confluent atelectasis, in part due to chronic hiatal hernia on the left. No pleural effusion or convincing pneumonia. 3. Multiple myeloma. Un-healed pathologic fractures of the left ribs 7 through 9, slightly more displaced since 08/05/2023. 4. Aortic Atherosclerosis (ICD10-I70.0). Coronary artery atherosclerosis. Electronically Signed   By: Odessa Fleming M.D.   On:  08/11/2023 12:55   DG Chest Port 1 View  Result Date: 08/11/2023 CLINICAL DATA:  Hypoxia and fever.  History of multiple myeloma. EXAM: PORTABLE CHEST 1 VIEW COMPARISON:  08/04/2023 FINDINGS: Stable cardiomediastinal contours. Unchanged asymmetric elevation of the right hemidiaphragm. No pleural effusion, airspace consolidation, or pneumothorax. No signs of interstitial edema. Multiple remote healed posterior and lateral right rib fractures, no acute osseous findings noted. IMPRESSION: No acute cardiopulmonary abnormalities. Unchanged chronic asymmetric  elevation of the right hemidiaphragm. Electronically Signed   By: Signa Kell M.D.   On: 08/11/2023 11:36    Pending Labs Unresulted Labs (From admission, onward)     Start     Ordered   08/12/23 0500  Basic metabolic panel  Tomorrow morning,   R        08/11/23 1455   08/12/23 0500  CBC  Tomorrow morning,   R        08/11/23 1455   08/11/23 1453  Culture, blood (Routine X 2) w Reflex to ID Panel  BLOOD CULTURE X 2,   R (with STAT occurrences)      08/11/23 1452   08/11/23 1329  Urine Culture  Once,   R        08/11/23 1329            Vitals/Pain Today's Vitals   08/11/23 1058 08/11/23 1059 08/11/23 1200 08/11/23 1240  BP: 115/71  110/61   Pulse: 92  75   Resp: 16  20   Temp: 99.6 F (37.6 C)   98.2 F (36.8 C)  TempSrc: Oral   Oral  SpO2: 95%  94%   Weight:  80 kg    Height:  5\' 7"  (1.702 m)    PainSc:  0-No pain  Isolation Precautions No active isolations  Medications Medications  traMADol (ULTRAM) tablet 50 mg (has no administration in time range)  dapsone tablet 100 mg (has no administration in time range)  valACYclovir (VALTREX) tablet 500 mg (has no administration in time range)  midodrine (PROAMATINE) tablet 2.5 mg (has no administration in time range)  escitalopram (LEXAPRO) tablet 10 mg (has no administration in time range)  tamsulosin (FLOMAX) capsule 0.4 mg (has no administration in time range)   ferrous sulfate tablet 325 mg (has no administration in time range)  folic acid (FOLVITE) tablet 1 mg (has no administration in time range)  cyanocobalamin (VITAMIN B12) tablet 1,000 mcg (has no administration in time range)  levothyroxine (SYNTHROID) tablet 125 mcg (has no administration in time range)  acetaminophen (TYLENOL) tablet 650 mg (has no administration in time range)    Or  acetaminophen (TYLENOL) suppository 650 mg (has no administration in time range)  traZODone (DESYREL) tablet 25 mg (has no administration in time range)  albuterol (PROVENTIL) (2.5 MG/3ML) 0.083% nebulizer solution 2.5 mg (has no administration in time range)  methylphenidate (RITALIN) tablet 2.5 mg (has no administration in time range)  iohexol (OMNIPAQUE) 350 MG/ML injection 75 mL (75 mLs Intravenous Contrast Given 08/11/23 1216)    Mobility walks with person assist

## 2023-08-11 NOTE — Consult Note (Signed)
Date of Admission:  08/11/2023          Reason for Consult: FUO    Referring Provider: Teena Dunk, MD   Assessment:  FUO Hypoxia Multiple myeloma reportedly in remission sp chemotherapy, stem cell transplant x 2, CART T cell therapy in  January 2024 Pancytopenia after this on Nplate Hypogammaglobulinemia on IVIG Vascular the predated CAR-T cell therapy but is worsened since then And in August with Pseudomonas aeruginosa bacteremia, and another admission in September with septic physiology and Pseudomonas isolated from urine Admission later this month with fever and hypoxia with negative workup including CT abdomen pelvis blood cultures and MRI of the jaw due to his jaw pain that was noticed Recent thrush status post Magic mouthwash which has improved and is not present on exam   Plan:  Check CT abdomen and pelvis Will check infectious serologies such as HIV viral hepatitides CMV and EBV serologies histoplasma antigen Blastomyces antigen urine and cryptococcal antigen and serum, autoimmune labs including ANCA rheumatoid factor ANA, SPEP TTE US duplex UE and LE Given that MM can itself cause fevers, I ? Whether or not he needs to have bone marrow biopsied again? Consider repeat MRI brain though his last did not show and infectious pathology PET scan can fall in the workup of FUO but it is not something that can be done as an inpatient  Active Problems:   Fever   Scheduled Meds:  [START ON 08/12/2023] cyanocobalamin  1,000 mcg Oral QAC breakfast   dapsone  100 mg Oral Daily   escitalopram  10 mg Oral Daily   ferrous sulfate  325 mg Oral BID   [START ON 08/12/2023] folic acid  1 mg Oral QAC breakfast   [START ON 08/12/2023] levothyroxine  125 mcg Oral Q0600   [START ON 08/12/2023] methylphenidate  2.5 mg Oral Daily   midodrine  2.5 mg Oral BID WC   tamsulosin  0.4 mg Oral QHS   [START ON 08/12/2023] valACYclovir  500 mg Oral QAC breakfast   Continuous  Infusions: PRN Meds:.acetaminophen **OR** acetaminophen, albuterol, traMADol, traZODone  HPI: Andre [name] is a 72 y.o. male with history of multiple myeloma status post 2 autologous stem cell transplants more recently CAR-T cell therapy in January 2024 followed by pancytopenia, also with apparent vascular dementia, has been maintained on IVIG as well as Nplate for hypogammaglobulinemia and thrombocytopenia and who has had several admissions with fevers.  In August 2024 he had pseudomonal bacteremia which did not have a clear-cut cause and for which she received IV antibiotics followed by ciprofloxacin.  In September he was admitted with foxy and fever and was thought to have sepsis and had Pseudomonas isolated in the urine for which she was treated with appropriate antibiotics.  More recently this November 18th he presented to the hospital with fever and confusion.  Cultures were taken as were urine cultures.  CT of the chest was done which was negative for pneumonia respiratory panel was negative for COVID flu RSV, and 20 pathogen respiratory virus panel was also negative.  When his blood cultures were negative x 48 hours and he was afebrile antibiotics were stopped.  During that hospitalization he was complaining of jaw pain and underwent MRI of the jaw which did not show any pathology.  He was discharged to his skilled nursing facility on 19 September now returns again with fevers as well as hypoxia.  He had a CT angiogram performed which shows  no evidence of pulmonary embolism and no evidence of pneumonia.  He does not have any urinary symptoms he has blood cultures that were taken and urine cultures it appears as well.  He states that his jaw pain which she is to be bilateral and then was more isolated in the left side has improved since he was last seen.  He did have thrush during his recent hospitalization but has no evidence of this on exam today.  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  Currently while residing in skilled nurse facility he is largely indoors without much exposure to the outdoors where he could be bitten by ticks for example.  Admission today he is anemic and thrombocytopenic but he is not leukopenic and he does not have neutropenia either.  His absolute lymphocyte count is suppressed as it has been recently.  We have been consulted to assist in the workup for fever of unknown origin in this patient.  I am ordering a CT of the abdomen pelvis and I will order serologies for infections that are not terribly likely to be positive including HIV viral hepatitides, while checking CMV and EBV serologies, histoplasma antigen and Blastomyces antigen and urine as well as a cryptococcal antigen and serum.  I will check an ANCA rheumatoid factor ANA.  Since Multiple myeloma ITSELF can cause fevers I wonder if his bone marrow needs to be assessed again--it did not show evidence of active disease on last bone marrow biopsy and bone marrow failure was suspected.  I have personally spent 86 minutes involved in face-to-face and non-face-to-face activities for this patient on the day of the visit. Professional time spent includes the following activities: Preparing to see the patient (review of tests), Obtaining and/or reviewing separately obtained history (admission/discharge record), Performing a medically appropriate examination and/or evaluation , Ordering medications/tests/procedures, referring and communicating with other health care professionals, Documenting clinical information in the EMR, Independently interpreting results (not separately reported), Communicating results to the patient/family/caregiver, Counseling and educating the patient/family/caregiver and Care coordination (not separately reported).          Review of Systems: Review of Systems  Unable to  perform ROS: Dementia    Past Medical History:  Diagnosis Date   Anemia    Arthritis    Asthma    no treatment x 20 years   Depression    Double vision    occurs at times    Duodenal ulcer    GERD (gastroesophageal reflux disease)    Hemorrhage of rectum and anus 09/15/2013   Hyperlipidemia    Hypertension    Hypothyroidism    Leukopenia due to antineoplastic chemotherapy (HCC) 02/22/2017   Multiple myeloma 07/04/2011   Thyroid disease    TIA (transient ischemic attack) 03/27/2012   Transient diplopia 03/27/2012   Transient global amnesia 06/07/2015    Social History   Tobacco Use   Smoking status: Former    Current packs/day: 0.00    Types: Cigarettes    Start date: 03/27/1966    Quit date: 03/27/1969    Years since quitting: 54.4   Smokeless tobacco: Never  Vaping Use   Vaping status: Never Used  Substance Use Topics   Alcohol use: No   Drug use: No    Family History  Problem Relation Age of Onset   Throat cancer Mother    Hypertension Father    Stroke Father  Asthma Father    Diabetes Father    Pancreatic cancer Brother    Allergies  Allergen Reactions   Atorvastatin Other (See Comments)    (LIPITOR) "Allergic," per [facility]   Rosuvastatin Other (See Comments)    (CRESTOR) Liver Disorder (adverse effects) and "Allergic," per [facility]   Beef-Derived Products Other (See Comments)    pescatarian    Chicken Protein Other (See Comments)    pescatarian   Pork-Derived Products Other (See Comments)    pescatarian    Septra [Sulfamethoxazole-Trimethoprim] Rash and Other (See Comments)    "Allergic," per [facility]    OBJECTIVE: Blood pressure 110/61, pulse 75, temperature 98.2 F (36.8 C), temperature source Oral, resp. rate 20, height 5\' 7"  (1.702 m), weight 80 kg, SpO2 94%.  Physical Exam Constitutional:      Appearance: He is well-developed.  HENT:     Head: Normocephalic and atraumatic.     Jaw: There is normal jaw occlusion.     Mouth/Throat:     Lips:  Pink.     Mouth: Mucous membranes are moist. No injury, lacerations, oral lesions or angioedema.     Tongue: No lesions. Tongue does not deviate from midline.     Palate: No mass and lesions.     Pharynx: Oropharynx is clear. Uvula midline.     Tonsils: No tonsillar exudate or tonsillar abscesses.  Eyes:     General: No scleral icterus.       Right eye: No discharge.        Left eye: No discharge.     Extraocular Movements: Extraocular movements intact.     Conjunctiva/sclera: Conjunctivae normal.     Pupils: Pupils are equal, round, and reactive to light.  Cardiovascular:     Rate and Rhythm: Normal rate and regular rhythm.     Heart sounds: No murmur heard.    No friction rub. No gallop.  Pulmonary:     Effort: Pulmonary effort is normal. No respiratory distress.     Breath sounds: No stridor. No wheezing or rhonchi.  Abdominal:     General: Abdomen is flat. There is no distension.     Palpations: Abdomen is soft. There is no mass.     Tenderness: There is no abdominal tenderness. There is no guarding.     Hernia: No hernia is present.  Musculoskeletal:        General: No tenderness. Normal range of motion.     Cervical back: Normal range of motion and neck supple.  Skin:    General: Skin is warm and dry.     Coloration: Skin is not pale.     Findings: No erythema or rash.  Neurological:     General: No focal deficit present.     Mental Status: He is alert and oriented to person, place, and time.  Psychiatric:        Mood and Affect: Mood normal.        Behavior: Behavior normal.        Thought Content: Thought content normal.        Judgment: Judgment normal.     Lab Results Lab Results  Component Value Date   WBC 4.6 08/11/2023   HGB 8.8 (L) 08/11/2023   HCT 26.6 (L) 08/11/2023   MCV 114.2 (H) 08/11/2023   PLT 21 (LL) 08/11/2023    Lab Results  Component Value Date   CREATININE 0.87 08/11/2023   BUN 12 08/11/2023   NA 134 (L) 08/11/2023  K 3.6 08/11/2023    CL 100 08/11/2023   CO2 25 08/11/2023    Lab Results  Component Value Date   ALT 10 08/11/2023   AST 19 08/11/2023   ALKPHOS 64 08/11/2023   BILITOT 0.5 08/11/2023     Microbiology: Recent Results (from the past 240 hour(s))  Blood Culture (routine x 2)     Status: None   Collection Time: 08/04/23 11:15 AM   Specimen: BLOOD  Result Value Ref Range Status   Specimen Description   Final    BLOOD BLOOD LEFT ARM Performed at [facility], [address], [place], [place] [address]    Special Requests   Final    BOTTLES DRAWN AEROBIC AND ANAEROBIC Blood Culture adequate volume Performed at [facility], [address], [place], [place] [address]    Culture   Final    NO GROWTH 5 DAYS Performed at [facility], [address] [place], [place] [address]    Report Status 08/09/2023 FINAL  Final  Blood Culture (routine x 2)     Status: None   Collection Time: 08/04/23 11:15 AM   Specimen: BLOOD  Result Value Ref Range Status   Specimen Description   Final    BLOOD BLOOD RIGHT ARM Performed at [facility], [address], [place], [place] [address]    Special Requests   Final    BOTTLES DRAWN AEROBIC AND ANAEROBIC Blood Culture adequate volume Performed at [facility], [address] [place], [place] [address]    Culture   Final    NO GROWTH 5 DAYS Performed at [facility], [address], [place], [place] [address]    Report Status 08/09/2023 FINAL  Final  Resp panel by RT-PCR (RSV, Flu A&B, Covid) Anterior Nasal Swab     Status: None   Collection Time: 08/04/23 12:07 PM   Specimen: Anterior Nasal Swab  Result Value Ref Range Status   SARS Coronavirus 2 by RT PCR NEGATIVE NEGATIVE Final    Comment: (NOTE) SARS-CoV-2 target nucleic acids are NOT DETECTED.  The SARS-CoV-2 RNA is generally detectable in upper respiratory specimens during the acute phase of infection. The  lowest concentration of SARS-CoV-2 viral copies this assay can detect is 138 copies/mL. A negative result does not preclude SARS-Cov-2 infection and should not be used as the sole basis for treatment or other patient management decisions. A negative result may occur with  improper specimen collection/handling, submission of specimen other than nasopharyngeal swab, presence of viral mutation(s) within the areas targeted by this assay, and inadequate number of viral copies(<138 copies/mL). A negative result must be combined with clinical observations, patient history, and epidemiological information. The expected result is Negative.  Fact Sheet for Patients:  BloggerCourse.com  Fact Sheet for Healthcare Providers:  SeriousBroker.it  This test is no t yet approved or cleared by the [place] FDA and  has been authorized for detection and/or diagnosis of SARS-CoV-2 by FDA under an Emergency Use Authorization (EUA). This EUA will remain  in effect (meaning this test can be used) for the duration of the COVID-19 declaration under Section 564(b)(1) of the Act, 21 U.S.C.section 360bbb-3(b)(1), unless the authorization is terminated  or revoked sooner.       Influenza A by PCR NEGATIVE NEGATIVE Final   Influenza B by PCR NEGATIVE NEGATIVE Final    Comment: (NOTE) The Xpert Xpress SARS-CoV-2/FLU/RSV plus assay is intended as an aid  in the diagnosis of influenza from Nasopharyngeal swab specimens and should not be used as a sole basis for treatment. Nasal washings and aspirates are unacceptable for Xpert Xpress SARS-CoV-2/FLU/RSV testing.  Fact Sheet for Patients: BloggerCourse.com  Fact Sheet for Healthcare Providers: SeriousBroker.it  This test is not yet approved or cleared by the [place] FDA and has been authorized for detection and/or diagnosis of SARS-CoV-2 by FDA under  an Emergency Use Authorization (EUA). This EUA will remain in effect (meaning this test can be used) for the duration of the COVID-19 declaration under Section 564(b)(1) of the Act, 21 U.S.C. section 360bbb-3(b)(1), unless the authorization is terminated or revoked.     Resp Syncytial Virus by PCR NEGATIVE NEGATIVE Final    Comment: (NOTE) Fact Sheet for Patients: BloggerCourse.com  Fact Sheet for Healthcare Providers: SeriousBroker.it  This test is not yet approved or cleared by the [place] FDA and has been authorized for detection and/or diagnosis of SARS-CoV-2 by FDA under an Emergency Use Authorization (EUA). This EUA will remain in effect (meaning this test can be used) for the duration of the COVID-19 declaration under Section 564(b)(1) of the Act, 21 U.S.C. section 360bbb-3(b)(1), unless the authorization is terminated or revoked.  Performed at [facility], [address] [place], [place] [address]   MRSA Next Gen by PCR, Nasal     Status: None   Collection Time: 08/04/23  5:41 PM   Specimen: Nasal Mucosa; Nasal Swab  Result Value Ref Range Status   MRSA by PCR Next Gen NOT DETECTED NOT DETECTED Final    Comment: (NOTE) The GeneXpert MRSA Assay (FDA approved for NASAL specimens only), is one component of a comprehensive MRSA colonization surveillance program. It is not intended to diagnose MRSA infection nor to guide or monitor treatment for MRSA infections. Test performance is not FDA approved in patients less than 69 years old. Performed at [facility], [address], [place], [place] [address]   Respiratory (~20 pathogens) panel by PCR     Status: None   Collection Time: 08/05/23  8:51 AM   Specimen: Nasopharyngeal Swab; Respiratory  Result Value Ref Range Status   Adenovirus NOT DETECTED NOT DETECTED Final   Coronavirus 229E NOT DETECTED NOT DETECTED Final     Comment: (NOTE) The Coronavirus on the Respiratory Panel, DOES NOT test for the novel  Coronavirus (2019 nCoV)    Coronavirus HKU1 NOT DETECTED NOT DETECTED Final   Coronavirus NL63 NOT DETECTED NOT DETECTED Final   Coronavirus OC43 NOT DETECTED NOT DETECTED Final   Metapneumovirus NOT DETECTED NOT DETECTED Final   Rhinovirus / Enterovirus NOT DETECTED NOT DETECTED Final   Influenza A NOT DETECTED NOT DETECTED Final   Influenza B NOT DETECTED NOT DETECTED Final   Parainfluenza Virus 1 NOT DETECTED NOT DETECTED Final   Parainfluenza Virus 2 NOT DETECTED NOT DETECTED Final   Parainfluenza Virus 3 NOT DETECTED NOT DETECTED Final   Parainfluenza Virus 4 NOT DETECTED NOT DETECTED Final   Respiratory Syncytial Virus NOT DETECTED NOT DETECTED Final   Bordetella pertussis NOT DETECTED NOT DETECTED Final   Bordetella Parapertussis NOT DETECTED NOT DETECTED Final   Chlamydophila pneumoniae NOT DETECTED NOT DETECTED Final   Mycoplasma pneumoniae NOT DETECTED NOT DETECTED Final    Comment: Performed at [facility], [address] [place], [place] [address]  SARS Coronavirus 2 by RT PCR (hospital order, performed in [facility] hospital lab) *cepheid single result test* Anterior Nasal  Swab     Status: None   Collection Time: 08/11/23 11:18 AM   Specimen: Anterior Nasal Swab  Result Value Ref Range Status   SARS Coronavirus 2 by RT PCR NEGATIVE NEGATIVE Final    Comment: (NOTE) SARS-CoV-2 target nucleic acids are NOT DETECTED.  The SARS-CoV-2 RNA is generally detectable in upper and lower respiratory specimens during the acute phase of infection. The lowest concentration of SARS-CoV-2 viral copies this assay can detect is 250 copies / mL. A negative result does not preclude SARS-CoV-2 infection and should not be used as the sole basis for treatment or other patient management decisions.  A negative result may occur with improper specimen collection / handling, submission of specimen  other than nasopharyngeal swab, presence of viral mutation(s) within the areas targeted by this assay, and inadequate number of viral copies (<250 copies / mL). A negative result must be combined with clinical observations, patient history, and epidemiological information.  Fact Sheet for Patients:   RoadLapTop.co.za  Fact Sheet for Healthcare Providers: http://kim-miller.com/  This test is not yet approved or  cleared by the [place] FDA and has been authorized for detection and/or diagnosis of SARS-CoV-2 by FDA under an Emergency Use Authorization (EUA).  This EUA will remain in effect (meaning this test can be used) for the duration of the COVID-19 declaration under Section 564(b)(1) of the Act, 21 U.S.C. section 360bbb-3(b)(1), unless the authorization is terminated or revoked sooner.  Performed at [facility], [address] [place], [place] [address]     Acey Lav, MD [facility] [place] [facility] Infectious [facility] [phone] pager  08/11/2023, 3:26 PM

## 2023-08-11 NOTE — H&P (Signed)
History and Physical  Andre [name]:[id number] DOB: 24-Apr-1951 DOA: 08/11/2023  PCP: [name], MD   Chief Complaint: fever   HPI: Andre [name] is a 72 y.o. male with medical history significant for Ultima myeloma in remission, chronic pancytopenia, hypothyroidism, GERD being admitted to the hospital with acute hypoxic respiratory failure and recurrent fever.  Patient has had multiple recent hospitalizations, including for Pseudomonas UTI and associated bacteremia.  However he was also recently admitted to [facility] long hospital 11/16 to 11/19 for fever was discharged back to his facility after extensive workup was negative and he was fever free for 24 hours off of antibiotics.  Unfortunately it seems the day after returning to his facility, he started having fever and bodyaches.  He has not had any altered mental status, however was found to be 88% on room air which is a new finding for him.  Patient and his wife deny any chest pain, cough, dyspnea, worsening peripheral edema, abdominal pain, rashes on the skin, sick contacts.  He did vomit a couple of days ago, which she is not typical for him.  Workup in the emergency department as noted below was unremarkable and hospitalist was contacted for admission.  Review of Systems: Please see HPI for pertinent positives and negatives. A complete 10 system review of systems are otherwise negative.  Past Medical History:  Diagnosis Date   Anemia    Arthritis    Asthma    no treatment x 20 years   Depression    Double vision    occurs at times    Duodenal ulcer    GERD (gastroesophageal reflux disease)    Hemorrhage of rectum and anus 09/15/2013   Hyperlipidemia    Hypertension    Hypothyroidism    Leukopenia due to antineoplastic chemotherapy (HCC) 02/22/2017   Multiple myeloma 07/04/2011   Thyroid disease    TIA (transient ischemic attack) 03/27/2012   Transient diplopia 03/27/2012   Transient global amnesia 06/07/2015    Past Surgical History:  Procedure Laterality Date   BONE MARROW TRANSPLANT  2011   for MM   CARDIOLITE STUDY  11/25/2003   NORMAL   TONSILLECTOMY      Social History:  reports that he quit smoking about 54 years ago. His smoking use included cigarettes. He started smoking about 57 years ago. He has never used smokeless tobacco. He reports that he does not drink alcohol and does not use drugs.   Allergies  Allergen Reactions   Atorvastatin Other (See Comments)    (LIPITOR) "Allergic," per [facility]   Rosuvastatin Other (See Comments)    (CRESTOR) Liver Disorder (adverse effects) and "Allergic," per [facility]   Beef-Derived Products Other (See Comments)    pescatarian    Chicken Protein Other (See Comments)    pescatarian   Pork-Derived Products Other (See Comments)    pescatarian    Septra [Sulfamethoxazole-Trimethoprim] Rash and Other (See Comments)    "Allergic," per [facility]    Family History  Problem Relation Age of Onset   Throat cancer Mother    Hypertension Father    Stroke Father    Asthma Father    Diabetes Father    Pancreatic cancer Brother      Prior to Admission medications   Medication Sig Start Date End Date Taking? Authorizing Provider  acetaminophen (TYLENOL) 500 MG tablet Take 2 tablets (1,000 mg total) by mouth every 8 (eight) hours. Patient taking differently: Take 1,000 mg by mouth 3 (three) times  daily as needed for mild pain (pain score 1-3) or moderate pain (pain score 4-6). 06/20/23  Yes Sheikh, Omair Latif, DO  acetaminophen (TYLENOL) 650 MG suppository Place 650 mg rectally every 4 (four) hours as needed for fever (>100.6).   Yes [provider]  Calcium Carb-Cholecalciferol (CALCIUM + VITAMIN D3) 500-5 MG-MCG TABS Take 1 tablet by mouth in the morning.   Yes [provider]  calcium carbonate (TUMS EX) 750 MG chewable tablet Chew 2 tablets by mouth 3 (three) times daily as needed (for indigestion).   Yes [provider]   Cholecalciferol (VITAMIN D3) 25 MCG (1000 UT) capsule Take 1,000 Units by mouth daily.   Yes [provider]  cyanocobalamin (VITAMIN B12) 1000 MCG tablet Take 1 tablet (1,000 mcg total) by mouth daily. Patient taking differently: Take 1,000 mcg by mouth in the morning. 04/09/23  Yes Rana Snare, NP  dapsone 100 MG tablet Take 100 mg by mouth daily.   Yes Ladene Artist, MD  docusate sodium (COLACE) 100 MG capsule Take 100 mg by mouth daily as needed (for constipation).   Yes [provider]  escitalopram (LEXAPRO) 10 MG tablet Take 1 tablet (10 mg total) by mouth daily. 06/27/22  Yes [name], Georgina Quint, MD  ferrous sulfate 325 (65 FE) MG EC tablet Take 325 mg by mouth 2 (two) times daily.   Yes [provider]  folic acid (FOLVITE) 1 MG tablet Take 1 tablet (1 mg total) by mouth daily. Patient taking differently: Take 1 mg by mouth in the morning. 04/09/23  Yes Rana Snare, NP  levothyroxine (SYNTHROID) 125 MCG tablet Take 125 mcg by mouth daily before breakfast. 04/12/23  Yes [provider]  Menthol-Methyl Salicylate (MUSCLE RUB) 10-15 % CREA Apply 1 Application topically as needed for muscle pain. 06/20/23  Yes Sheikh, Omair Latif, DO  methylphenidate (RITALIN) 5 MG tablet Take 0.5 tablets (2.5 mg total) by mouth every morning. 07/26/23  Yes Wert, Trula Ore, NP  midodrine (PROAMATINE) 2.5 MG tablet Take 1 tablet (2.5 mg total) by mouth 2 (two) times daily with a meal. Patient taking differently: Take 2.5 mg by mouth 2 (two) times daily with a meal. Hold is SBP >140 07/12/23  Yes Wert, Christina, NP  ondansetron (ZOFRAN-ODT) 4 MG disintegrating tablet Take 4 mg by mouth every 6 (six) hours as needed for nausea or vomiting.   Yes [provider]  pantoprazole (PROTONIX) 40 MG tablet Take 1 tablet (40 mg total) by mouth daily. Patient taking differently: Take 40 mg by mouth daily with breakfast. 04/20/23  Yes Wert, Trula Ore, NP  Probiotic Product  (UP4 PROBIOTICS WOMENS PO) Take 1 capsule by mouth daily. Hyperbiotics pro women wit D manos plus cranberry   Yes [provider]  tamsulosin (FLOMAX) 0.4 MG CAPS capsule Take 0.4 mg by mouth at bedtime. 06/29/23  Yes [provider]  traMADol (ULTRAM) 50 MG tablet Take 50 mg by mouth every 12 (twelve) hours as needed for moderate pain (pain score 4-6) or severe pain (pain score 7-10).   Yes [provider]  valACYclovir (VALTREX) 500 MG tablet Take 500 mg by mouth in the morning.   Yes [provider]  prochlorperazine (COMPAZINE) 10 MG tablet Take 1 tablet (10 mg total) by mouth every 6 (six) hours as needed (Nausea or vomiting). 12/14/20 12/12/21  Magrinat, Valentino Hue, MD    Physical Exam: BP 110/61   Pulse 75   Temp 98.2 F (36.8 C) (Oral)  Resp 20   Ht 5\' 7"  (1.702 m)   Wt 80 kg   SpO2 94%   BMI 27.63 kg/m   General:  Alert, oriented, calm, in no acute distress, wearing nasal cannula oxygen.  His wife is at the bedside. Cardiovascular: RRR, no murmurs or rubs, no peripheral edema  Respiratory: clear to auscultation bilaterally, no wheezes, no crackles  Abdomen: soft, nontender, nondistended, normal bowel tones heard  Skin: dry, no rashes, some bruising in his extremities but nothing that looks acute Musculoskeletal: no joint effusions, normal range of motion  Psychiatric: appropriate affect, normal speech  Neurologic: extraocular muscles intact, clear speech, moving all extremities with intact sensorium         Labs on Admission:  Basic Metabolic Panel: Recent Labs  Lab 08/05/23 0532 08/11/23 1118  NA 137 134*  K 4.3 3.6  CL 104 100  CO2 25 25  GLUCOSE 94 100*  BUN 14 12  CREATININE 0.80 0.87  CALCIUM 9.1 8.9   Liver Function Tests: Recent Labs  Lab 08/05/23 0532 08/11/23 1118  AST 20 19  ALT 16 10  ALKPHOS 71 64  BILITOT 0.7 0.5  PROT 6.8 6.2*  ALBUMIN 3.3* 3.3*   No results for input(s): "LIPASE", "AMYLASE" in the last  168 hours. No results for input(s): "AMMONIA" in the last 168 hours. CBC: Recent Labs  Lab 08/05/23 0532 08/06/23 0453 08/10/23 1023 08/11/23 1118  WBC 3.6* 3.2* 4.2 4.6  NEUTROABS 2.5 2.0 2.9 3.2  HGB 9.2* 8.5* 9.6* 8.8*  HCT 29.4* 26.2* 29.0* 26.6*  MCV 121.0* 117.0* 114.2* 114.2*  PLT 24* 22* 23* 21*   Cardiac Enzymes: No results for input(s): "CKTOTAL", "CKMB", "CKMBINDEX", "TROPONINI" in the last 168 hours.  BNP (last 3 results) Recent Labs    05/05/23 1744  BNP 116.7*    ProBNP (last 3 results) No results for input(s): "PROBNP" in the last 8760 hours.  CBG: No results for input(s): "GLUCAP" in the last 168 hours.  Radiological Exams on Admission: CT Angio Chest PE W/Cm &/Or Wo Cm  Result Date: 08/11/2023 CLINICAL DATA:  72 year old male with fever and body ache. Undergoing chemotherapy for multiple myeloma. EXAM: CT ANGIOGRAPHY CHEST WITH CONTRAST TECHNIQUE: Multidetector CT imaging of the chest was performed using the standard protocol during bolus administration of intravenous contrast. Multiplanar CT image reconstructions and MIPs were obtained to evaluate the vascular anatomy. RADIATION DOSE REDUCTION: This exam was performed according to the departmental dose-optimization program which includes automated exposure control, adjustment of the mA and/or kV according to patient size and/or use of iterative reconstruction technique. CONTRAST:  75mL OMNIPAQUE IOHEXOL 350 MG/ML SOLN COMPARISON:  Portable chest 1123 hours today. Previous chest CTA 05/20/2023. Recent chest CT 08/05/2023. FINDINGS: Cardiovascular: Good contrast bolus timing in the pulmonary arterial tree. No pulmonary artery filling defect identified. Calcified aortic atherosclerosis. Calcified coronary artery atherosclerosis. Heart size within normal limits. No pericardial effusion. Mediastinum/Nodes: Moderate to large gastric hiatal hernia. No mediastinal lymphadenopathy. Lungs/Pleura: Low lung volumes  superimposed on chronic gastric hiatal hernia and elevated right hemidiaphragm. Confluent bilateral lower lobe atelectasis. Central airways remain patent. No convincing acute pneumonia, acute lung inflammation. Upper Abdomen: Stable, negative visible mostly noncontrast upper abdominal viscera. Musculoskeletal: Generalized bone mineralization heterogeneity of multiple myeloma. Chronic T12 compression fracture. Numerous chronic rib fractures. Un healed pathologic fractures of the lateral left 7th and 8th ribs, similar to recent chest CT. Unhealed posterior left 9th rib fracture is mildly more displaced from the recent CT. No brand  new osseous abnormality is identified. Review of the MIP images confirms the above findings. IMPRESSION: 1. Negative for acute pulmonary embolus. 2. Low lung volumes with confluent atelectasis, in part due to chronic hiatal hernia on the left. No pleural effusion or convincing pneumonia. 3. Multiple myeloma. Un-healed pathologic fractures of the left ribs 7 through 9, slightly more displaced since 08/05/2023. 4. Aortic Atherosclerosis (ICD10-I70.0). Coronary artery atherosclerosis. Electronically Signed   By: Odessa Fleming M.D.   On: 08/11/2023 12:55   DG Chest Port 1 View  Result Date: 08/11/2023 CLINICAL DATA:  Hypoxia and fever.  History of multiple myeloma. EXAM: PORTABLE CHEST 1 VIEW COMPARISON:  08/04/2023 FINDINGS: Stable cardiomediastinal contours. Unchanged asymmetric elevation of the right hemidiaphragm. No pleural effusion, airspace consolidation, or pneumothorax. No signs of interstitial edema. Multiple remote healed posterior and lateral right rib fractures, no acute osseous findings noted. IMPRESSION: No acute cardiopulmonary abnormalities. Unchanged chronic asymmetric  elevation of the right hemidiaphragm. Electronically Signed   By: Signa Kell M.D.   On: 08/11/2023 11:36    Assessment/Plan ALLISON CORUM is a 72 y.o. male with medical history significant for Ultima  myeloma in remission, chronic pancytopenia, hypothyroidism, GERD being admitted to the hospital with acute hypoxic respiratory failure and recurrent fever.  Recurrent fever-etiology is unclear, he previously had Pseudomonas UTI, and concern for bacteremia which was later felt to be contaminant.  Currently has no localizing symptoms though he has some new hypoxia.  Could be something as simple as atelectasis but need to rule out other causes. -Observation admission -Hold antibiotics -Peripheral blood culture x 2 -Tylenol and repeat blood cultures for recurrent fever -Urinalysis unimpressive, COVID-negative.  Negative respiratory virus panel earlier this week. -Discussed with Dr. Algis Liming of ID, who is seeing in consultation  Acute hypoxic respiratory failure-unclear etiology, CT chest without PE, obvious consolidation, etc. -Continue supplemental oxygen, and wean as tolerated  History of multiple myeloma with chronic pancytopenia -Continue dapsone, Valtrex  Hypothyroidism-Synthroid  Chronic anemia and thrombocytopenia-stable  DVT prophylaxis: SCDs    Code Status: Limited: Do not attempt resuscitation (DNR) -DNR-LIMITED -Do Not Intubate/DNI   Consults called: Infectious disease  Admission status: Observation  Time spent: 59 minutes  Corday Wyka Sharlette Dense MD [facility] Pager [phone]  If 7PM-7AM, please contact night-coverage [email or web address] Password [facility]  08/11/2023, 2:55 PM

## 2023-08-12 DIAGNOSIS — C9001 Multiple myeloma in remission: Secondary | ICD-10-CM

## 2023-08-12 DIAGNOSIS — R509 Fever, unspecified: Secondary | ICD-10-CM | POA: Diagnosis not present

## 2023-08-12 DIAGNOSIS — S2242XD Multiple fractures of ribs, left side, subsequent encounter for fracture with routine healing: Secondary | ICD-10-CM | POA: Diagnosis not present

## 2023-08-12 DIAGNOSIS — J9611 Chronic respiratory failure with hypoxia: Secondary | ICD-10-CM

## 2023-08-12 DIAGNOSIS — R062 Wheezing: Secondary | ICD-10-CM

## 2023-08-12 DIAGNOSIS — Z8579 Personal history of other malignant neoplasms of lymphoid, hematopoietic and related tissues: Secondary | ICD-10-CM

## 2023-08-12 DIAGNOSIS — R5081 Fever presenting with conditions classified elsewhere: Secondary | ICD-10-CM | POA: Diagnosis not present

## 2023-08-12 LAB — BASIC METABOLIC PANEL
Anion gap: 6 (ref 5–15)
BUN: 13 mg/dL (ref 8–23)
CO2: 26 mmol/L (ref 22–32)
Calcium: 8.8 mg/dL — ABNORMAL LOW (ref 8.9–10.3)
Chloride: 102 mmol/L (ref 98–111)
Creatinine, Ser: 0.73 mg/dL (ref 0.61–1.24)
GFR, Estimated: >60 mL/min (ref >60)
Glucose, Bld: 97 mg/dL (ref 70–99)
Potassium: 3.5 mmol/L (ref 3.5–5.1)
Sodium: 134 mmol/L — ABNORMAL LOW (ref 135–145)

## 2023-08-12 LAB — HEPATITIS PANEL, ACUTE
HCV Ab: NONREACTIVE
Hep A IgM: NONREACTIVE
Hep B C IgM: NONREACTIVE
Hepatitis B Surface Ag: NONREACTIVE

## 2023-08-12 LAB — CBC
HCT: 24.3 % — ABNORMAL LOW (ref 39.0–52.0)
Hemoglobin: 8 g/dL — ABNORMAL LOW (ref 13.0–17.0)
MCH: 37.4 pg — ABNORMAL HIGH (ref 26.0–34.0)
MCHC: 32.9 g/dL (ref 30.0–36.0)
MCV: 113.6 fL — ABNORMAL HIGH (ref 80.0–100.0)
Platelets: 17 10*3/uL — CL (ref 150–400)
RBC: 2.14 MIL/uL — ABNORMAL LOW (ref 4.22–5.81)
RDW: 15.7 % — ABNORMAL HIGH (ref 11.5–15.5)
WBC: 3.7 10*3/uL — ABNORMAL LOW (ref 4.0–10.5)
nRBC: 0.8 % — ABNORMAL HIGH (ref 0.0–0.2)

## 2023-08-12 LAB — CRYPTOCOCCAL ANTIGEN: Crypto Ag: NEGATIVE

## 2023-08-12 LAB — PROCALCITONIN: Procalcitonin: <0.1 ng/mL

## 2023-08-12 LAB — C-REACTIVE PROTEIN: CRP: 5.7 mg/dL — ABNORMAL HIGH (ref <1.0)

## 2023-08-12 LAB — RPR: RPR Ser Ql: NONREACTIVE

## 2023-08-12 LAB — HIV ANTIBODY (ROUTINE TESTING W REFLEX): HIV Screen 4th Generation wRfx: NONREACTIVE

## 2023-08-12 MED ORDER — MORPHINE SULFATE (PF) 2 MG/ML IV SOLN: 2.0000 mg | Freq: Once | INTRAVENOUS | Status: DC

## 2023-08-12 MED ORDER — IPRATROPIUM-ALBUTEROL 0.5-2.5 (3) MG/3ML IN SOLN
3.0000 mL | Freq: Three times a day (TID) | RESPIRATORY_TRACT | Status: DC
  Administered 2023-08-12 – 2023-08-13 (×3): 3 mL via RESPIRATORY_TRACT
  Filled 2023-08-12 (×4): qty 3

## 2023-08-12 NOTE — Progress Notes (Addendum)
Subjective:  Patient says he does not feel well though he did not give me much specifics and was minimally interactive today without family at the bedside  Antibiotics:  Anti-infectives (From admission, onward)    Start     Dose/Rate Route Frequency Ordered Stop   08/12/23 0800  valACYclovir (VALTREX) tablet 500 mg        500 mg Oral Daily before breakfast 08/11/23 1455     08/11/23 1600  dapsone tablet 100 mg        100 mg Oral Daily 08/11/23 1455         Medications: Scheduled Meds:  cyanocobalamin  1,000 mcg Oral QAC breakfast   dapsone  100 mg Oral Daily   escitalopram  10 mg Oral Daily   ferrous sulfate  325 mg Oral BID   folic acid  1 mg Oral QAC breakfast   ipratropium-albuterol  3 mL Nebulization TID   levothyroxine  125 mcg Oral Q0600   methylphenidate  2.5 mg Oral Daily   midodrine  2.5 mg Oral BID WC    morphine injection  2 mg Intravenous Once   tamsulosin  0.4 mg Oral QHS   valACYclovir  500 mg Oral QAC breakfast   Continuous Infusions: PRN Meds:.acetaminophen **OR** acetaminophen, albuterol, traMADol, traZODone    Objective: Weight change:   Intake/Output Summary (Last 24 hours) at 08/12/2023 1530 Last data filed at 08/12/2023 1400 Gross per 24 hour  Intake 800 ml  Output 500 ml  Net 300 ml   Blood pressure 135/76, pulse 100, temperature (!) 100.9 F (38.3 C), temperature source Oral, resp. rate 18, height 5\' 7"  (1.702 m), weight 80 kg, SpO2 93%. Temp:  [97.3 F (36.3 C)-101.6 F (38.7 C)] 100.9 F (38.3 C) (11/24 1336) Pulse Rate:  [63-104] 100 (11/24 1336) Resp:  [14-20] 18 (11/24 1336) BP: (113-150)/(53-76) 135/76 (11/24 1336) SpO2:  [91 %-95 %] 93 % (11/24 1336) FiO2 (%):  [32 %] 32 % (11/24 1122)  Physical Exam: Physical Exam Constitutional:      Appearance: He is well-developed. He is ill-appearing.  HENT:     Head: Normocephalic and atraumatic.  Eyes:     Conjunctiva/sclera: Conjunctivae normal.  Cardiovascular:      Rate and Rhythm: Normal rate and regular rhythm.     Heart sounds: No murmur heard.    No friction rub. No gallop.  Pulmonary:     Effort: Pulmonary effort is normal. No respiratory distress.     Breath sounds: Normal breath sounds. No stridor. No wheezing.  Abdominal:     General: There is no distension.     Palpations: Abdomen is soft.  Musculoskeletal:        General: Normal range of motion.     Cervical back: Normal range of motion and neck supple.  Skin:    General: Skin is warm and dry.     Findings: No erythema or rash.  Neurological:     General: No focal deficit present.     Mental Status: He is alert.  Psychiatric:        Attention and Perception: He is inattentive.        Speech: Speech is delayed.        Cognition and Memory: Cognition is impaired. Memory is impaired. He exhibits impaired recent memory and impaired remote memory.      CBC:    BMET Recent Labs    08/11/23 1118 08/12/23 0352  NA 134*  134*  K 3.6 3.5  CL 100 102  CO2 25 26  GLUCOSE 100* 97  BUN 12 13  CREATININE 0.87 0.73  CALCIUM 8.9 8.8*     Liver Panel  Recent Labs    08/11/23 1118  PROT 6.2*  ALBUMIN 3.3*  AST 19  ALT 10  ALKPHOS 64  BILITOT 0.5       Sedimentation Rate Recent Labs    08/11/23 1816  ESRSEDRATE 70*   C-Reactive Protein Recent Labs    08/11/23 1816  CRP 5.7*    Micro Results: Recent Results (from the past 720 hour(s))  Blood Culture (routine x 2)     Status: None   Collection Time: 08/04/23 11:15 AM   Specimen: BLOOD  Result Value Ref Range Status   Specimen Description   Final    BLOOD BLOOD LEFT ARM Performed at [facility], [address], [place], [place] [address]    Special Requests   Final    BOTTLES DRAWN AEROBIC AND ANAEROBIC Blood Culture adequate volume Performed at [facility], [address], [place], [place] [address]    Culture   Final    NO GROWTH 5 DAYS Performed at [facility], [address], [place], [place] [address]    Report Status 08/09/2023 FINAL  Final  Blood Culture (routine x 2)     Status: None   Collection Time: 08/04/23 11:15 AM   Specimen: BLOOD  Result Value Ref Range Status   Specimen Description   Final    BLOOD BLOOD RIGHT ARM Performed at [facility], [address], [place], [place] [address]    Special Requests   Final    BOTTLES DRAWN AEROBIC AND ANAEROBIC Blood Culture adequate volume Performed at [facility], [address] [place], [place] [address]    Culture   Final    NO GROWTH 5 DAYS Performed at [facility], [address] [place], [place] [address]    Report Status 08/09/2023 FINAL  Final  Resp panel by RT-PCR (RSV, Flu A&B, Covid) Anterior Nasal Swab     Status: None   Collection Time: 08/04/23 12:07 PM   Specimen: Anterior Nasal Swab  Result Value Ref Range Status   SARS Coronavirus 2 by RT PCR NEGATIVE NEGATIVE Final    Comment: (NOTE) SARS-CoV-2 target nucleic acids are NOT DETECTED.  The SARS-CoV-2 RNA is generally detectable in upper respiratory specimens during the acute phase of infection. The lowest concentration of SARS-CoV-2 viral copies this assay can detect is 138 copies/mL. A negative result does not preclude SARS-Cov-2 infection and should not be used as the sole basis for treatment or other patient management decisions. A negative result may occur with  improper specimen collection/handling, submission of specimen other than nasopharyngeal swab, presence of viral mutation(s) within the areas targeted by this assay, and inadequate number of viral copies(<138 copies/mL). A negative result must be combined with clinical observations, patient history, and epidemiological information. The expected result is Negative.  Fact Sheet for Patients:  BloggerCourse.com  Fact Sheet for Healthcare Providers:   SeriousBroker.it  This test is no t yet approved or cleared by the [place] FDA and  has been authorized for detection and/or diagnosis of SARS-CoV-2 by FDA under an Emergency Use Authorization (EUA). This EUA will remain  in effect (meaning this test can be used) for the duration of the COVID-19 declaration under Section 564(b)(1) of the  Act, 21 U.S.C.section 360bbb-3(b)(1), unless the authorization is terminated  or revoked sooner.       Influenza A by PCR NEGATIVE NEGATIVE Final   Influenza B by PCR NEGATIVE NEGATIVE Final    Comment: (NOTE) The Xpert Xpress SARS-CoV-2/FLU/RSV plus assay is intended as an aid in the diagnosis of influenza from Nasopharyngeal swab specimens and should not be used as a sole basis for treatment. Nasal washings and aspirates are unacceptable for Xpert Xpress SARS-CoV-2/FLU/RSV testing.  Fact Sheet for Patients: BloggerCourse.com  Fact Sheet for Healthcare Providers: SeriousBroker.it  This test is not yet approved or cleared by the [place] FDA and has been authorized for detection and/or diagnosis of SARS-CoV-2 by FDA under an Emergency Use Authorization (EUA). This EUA will remain in effect (meaning this test can be used) for the duration of the COVID-19 declaration under Section 564(b)(1) of the Act, 21 U.S.C. section 360bbb-3(b)(1), unless the authorization is terminated or revoked.     Resp Syncytial Virus by PCR NEGATIVE NEGATIVE Final    Comment: (NOTE) Fact Sheet for Patients: BloggerCourse.com  Fact Sheet for Healthcare Providers: SeriousBroker.it  This test is not yet approved or cleared by the [place] FDA and has been authorized for detection and/or diagnosis of SARS-CoV-2 by FDA under an Emergency Use Authorization (EUA). This EUA will remain in effect (meaning this test can be used) for  the duration of the COVID-19 declaration under Section 564(b)(1) of the Act, 21 U.S.C. section 360bbb-3(b)(1), unless the authorization is terminated or revoked.  Performed at [facility], [address], [place], [place] [address]   MRSA Next Gen by PCR, Nasal     Status: None   Collection Time: 08/04/23  5:41 PM   Specimen: Nasal Mucosa; Nasal Swab  Result Value Ref Range Status   MRSA by PCR Next Gen NOT DETECTED NOT DETECTED Final    Comment: (NOTE) The GeneXpert MRSA Assay (FDA approved for NASAL specimens only), is one component of a comprehensive MRSA colonization surveillance program. It is not intended to diagnose MRSA infection nor to guide or monitor treatment for MRSA infections. Test performance is not FDA approved in patients less than 73 years old. Performed at [facility], [address], [place], [place] [address]   Respiratory (~20 pathogens) panel by PCR     Status: None   Collection Time: 08/05/23  8:51 AM   Specimen: Nasopharyngeal Swab; Respiratory  Result Value Ref Range Status   Adenovirus NOT DETECTED NOT DETECTED Final   Coronavirus 229E NOT DETECTED NOT DETECTED Final    Comment: (NOTE) The Coronavirus on the Respiratory Panel, DOES NOT test for the novel  Coronavirus (2019 nCoV)    Coronavirus HKU1 NOT DETECTED NOT DETECTED Final   Coronavirus NL63 NOT DETECTED NOT DETECTED Final   Coronavirus OC43 NOT DETECTED NOT DETECTED Final   Metapneumovirus NOT DETECTED NOT DETECTED Final   Rhinovirus / Enterovirus NOT DETECTED NOT DETECTED Final   Influenza A NOT DETECTED NOT DETECTED Final   Influenza B NOT DETECTED NOT DETECTED Final   Parainfluenza Virus 1 NOT DETECTED NOT DETECTED Final   Parainfluenza Virus 2 NOT DETECTED NOT DETECTED Final   Parainfluenza Virus 3 NOT DETECTED NOT DETECTED Final   Parainfluenza Virus 4 NOT DETECTED NOT DETECTED Final   Respiratory Syncytial Virus NOT DETECTED NOT DETECTED Final    Bordetella pertussis NOT DETECTED NOT DETECTED Final   Bordetella Parapertussis NOT DETECTED NOT DETECTED Final   Chlamydophila pneumoniae NOT  DETECTED NOT DETECTED Final   Mycoplasma pneumoniae NOT DETECTED NOT DETECTED Final    Comment: Performed at [facility], [address], [place], [place] [address]  SARS Coronavirus 2 by RT PCR (hospital order, performed in [facility] hospital lab) *cepheid single result test* Anterior Nasal Swab     Status: None   Collection Time: 08/11/23 11:18 AM   Specimen: Anterior Nasal Swab  Result Value Ref Range Status   SARS Coronavirus 2 by RT PCR NEGATIVE NEGATIVE Final    Comment: (NOTE) SARS-CoV-2 target nucleic acids are NOT DETECTED.  The SARS-CoV-2 RNA is generally detectable in upper and lower respiratory specimens during the acute phase of infection. The lowest concentration of SARS-CoV-2 viral copies this assay can detect is 250 copies / mL. A negative result does not preclude SARS-CoV-2 infection and should not be used as the sole basis for treatment or other patient management decisions.  A negative result may occur with improper specimen collection / handling, submission of specimen other than nasopharyngeal swab, presence of viral mutation(s) within the areas targeted by this assay, and inadequate number of viral copies (<250 copies / mL). A negative result must be combined with clinical observations, patient history, and epidemiological information.  Fact Sheet for Patients:   RoadLapTop.co.za  Fact Sheet for Healthcare Providers: http://kim-miller.com/  This test is not yet approved or  cleared by the [place] FDA and has been authorized for detection and/or diagnosis of SARS-CoV-2 by FDA under an Emergency Use Authorization (EUA).  This EUA will remain in effect (meaning this test can be used) for the duration of the COVID-19 declaration under Section 564(b)(1) of the  Act, 21 U.S.C. section 360bbb-3(b)(1), unless the authorization is terminated or revoked sooner.  Performed at [facility], [address] [place], [place] [address]   Culture, blood (Routine X 2) w Reflex to ID Panel     Status: None (Preliminary result)   Collection Time: 08/11/23  6:16 PM   Specimen: BLOOD RIGHT HAND  Result Value Ref Range Status   Specimen Description   Final    BLOOD RIGHT HAND Performed at [facility], [address], [place], [place] [address]    Special Requests   Final    BOTTLES DRAWN AEROBIC ONLY Blood Culture adequate volume Performed at [facility], [address] [place], [place] [address]    Culture   Final    NO GROWTH < 12 HOURS Performed at [facility], [address], [place], [place] [address]    Report Status PENDING  Incomplete  Culture, blood (Routine X 2) w Reflex to ID Panel     Status: None (Preliminary result)   Collection Time: 08/11/23  6:16 PM   Specimen: BLOOD LEFT HAND  Result Value Ref Range Status   Specimen Description   Final    BLOOD LEFT HAND Performed at [facility], [address], [place], [place] [address]    Special Requests   Final    BOTTLES DRAWN AEROBIC ONLY Blood Culture adequate volume Performed at [facility], [address], [place], [place] [address]    Culture   Final    NO GROWTH < 12 HOURS Performed at [facility], [address] [place], [place] [address]    Report Status PENDING  Incomplete    Studies/Results: CT HEAD WO CONTRAST ( )  Result Date: 08/11/2023 CLINICAL DATA:  Unwitnessed fall EXAM:  CT HEAD WITHOUT CONTRAST TECHNIQUE: Contiguous axial images were obtained from the base of the skull through the vertex without intravenous contrast. RADIATION DOSE REDUCTION: This exam was performed according to the departmental dose-optimization program which includes automated exposure control, adjustment of  the mA and/or kV according to patient size and/or use of iterative reconstruction technique. COMPARISON:  05/23/2023 FINDINGS: Brain: No evidence of acute infarction, hemorrhage, mass, mass effect, or midline shift. No hydrocephalus or extra-axial fluid collection. Periventricular white matter changes, likely the sequela of chronic small vessel ischemic disease. Redemonstrated remote left cerebellar infarct. Vascular: No hyperdense vessel. Skull: Negative for fracture. Redemonstrated lucencies in the calvarium, consistent with the patient's history of multiple myeloma. Sinuses/Orbits: Mucosal thickening in the ethmoid air cells. No acute finding in the orbits. Other: The mastoid air cells are well aerated. IMPRESSION: No acute intracranial process. Electronically Signed   By: Wiliam Ke M.D.   On: 08/11/2023 22:20   CT Angio Chest PE W/Cm &/Or Wo Cm  Result Date: 08/11/2023 CLINICAL DATA:  72 year old male with fever and body ache. Undergoing chemotherapy for multiple myeloma. EXAM: CT ANGIOGRAPHY CHEST WITH CONTRAST TECHNIQUE: Multidetector CT imaging of the chest was performed using the standard protocol during bolus administration of intravenous contrast. Multiplanar CT image reconstructions and MIPs were obtained to evaluate the vascular anatomy. RADIATION DOSE REDUCTION: This exam was performed according to the departmental dose-optimization program which includes automated exposure control, adjustment of the mA and/or kV according to patient size and/or use of iterative reconstruction technique. CONTRAST:  75mL OMNIPAQUE IOHEXOL 350 MG/ML SOLN COMPARISON:  Portable chest 1123 hours today. Previous chest CTA 05/20/2023. Recent chest CT 08/05/2023. FINDINGS: Cardiovascular: Good contrast bolus timing in the pulmonary arterial tree. No pulmonary artery filling defect identified. Calcified aortic atherosclerosis. Calcified coronary artery atherosclerosis. Heart size within normal limits. No pericardial  effusion. Mediastinum/Nodes: Moderate to large gastric hiatal hernia. No mediastinal lymphadenopathy. Lungs/Pleura: Low lung volumes superimposed on chronic gastric hiatal hernia and elevated right hemidiaphragm. Confluent bilateral lower lobe atelectasis. Central airways remain patent. No convincing acute pneumonia, acute lung inflammation. Upper Abdomen: Stable, negative visible mostly noncontrast upper abdominal viscera. Musculoskeletal: Generalized bone mineralization heterogeneity of multiple myeloma. Chronic T12 compression fracture. Numerous chronic rib fractures. Un healed pathologic fractures of the lateral left 7th and 8th ribs, similar to recent chest CT. Unhealed posterior left 9th rib fracture is mildly more displaced from the recent CT. No brand new osseous abnormality is identified. Review of the MIP images confirms the above findings. IMPRESSION: 1. Negative for acute pulmonary embolus. 2. Low lung volumes with confluent atelectasis, in part due to chronic hiatal hernia on the left. No pleural effusion or convincing pneumonia. 3. Multiple myeloma. Un-healed pathologic fractures of the left ribs 7 through 9, slightly more displaced since 08/05/2023. 4. Aortic Atherosclerosis (ICD10-I70.0). Coronary artery atherosclerosis. Electronically Signed   By: Odessa Fleming M.D.   On: 08/11/2023 12:55   DG Chest Port 1 View  Result Date: 08/11/2023 CLINICAL DATA:  Hypoxia and fever.  History of multiple myeloma. EXAM: PORTABLE CHEST 1 VIEW COMPARISON:  08/04/2023 FINDINGS: Stable cardiomediastinal contours. Unchanged asymmetric elevation of the right hemidiaphragm. No pleural effusion, airspace consolidation, or pneumothorax. No signs of interstitial edema. Multiple remote healed posterior and lateral right rib fractures, no acute osseous findings noted. IMPRESSION: No acute cardiopulmonary abnormalities. Unchanged chronic asymmetric  elevation of the right hemidiaphragm. Electronically Signed   By: Signa Kell M.D.   On: 08/11/2023 11:36  Assessment/Plan:  INTERVAL HISTORY: serologies cooking, blood cultures NG, CT abdomen pending   Principal Problem:   FUO (fever of unknown origin) Active Problems:   Diaphragmatic hernia   Pancytopenia (HCC)   Hypothyroidism   Mild cognitive impairment   History of engineered cell therapy infusion   Multiple rib fractures   Chronic respiratory failure with hypoxia (HCC)   History of multiple myeloma   Wheezing    JONG NELLS is a 72 y.o. male with history of multiple myeloma status post 2 autologous stem cell transplants more recently CAR-T cell therapy in January 2024 followed by pancytopenia, also with apparent vascular dementia, has been maintained on IVIG as well as Nplate for hypogammaglobulinemia and thrombocytopenia and who has had several admissions with fevers.   In August 2024 he had pseudomonal bacteremia which did not have a clear-cut cause and for which she received IV antibiotics followed by ciprofloxacin.   In September he was admitted with foxy and fever and was thought to have sepsis and had Pseudomonas isolated in the urine for which she was treated with appropriate antibiotics.   More recently this November 18th he presented to the hospital with fever and confusion.   Cultures were taken as were urine cultures.  CT of the chest was done which was negative for pneumonia respiratory panel was negative for COVID flu RSV, and 20 pathogen respiratory virus panel was also negative.   When his blood cultures were negative x 48 hours and he was afebrile antibiotics were stopped.  During that hospitalization he was complaining of jaw pain and underwent MRI of the jaw which did not show any pathology.   He was discharged to his skilled nursing facility on 19 September now returns again with fevers as well as hypoxia.  He had a CT angiogram performed which shows no evidence of pulmonary embolism and no evidence of pneumonia.    He does not have any urinary symptoms he has blood cultures that were taken remain with NGTD  I ordered a CT abdomen and pelvis and multiple serologies, colluding HIV syphilis hepatitis panel all of which have neck been negative his inflammatory markers are nonspecifically elevated.  I have suspicion that his fevers are not due to an infectious cause and I wonder if multiple myeloma itself could be driving this.  Recommend engage with Hematology/Oncology, preferably Dr. Truett Perna in am.  Delirium + dementia: may also be good to engage with palliative.  I have personally spent 38 minutes involved in face-to-face and non-face-to-face activities for this patient on the day of the visit. Professional time spent includes the following activities: Preparing to see the patient (review of tests), Obtaining and/or reviewing separately obtained history (admission/discharge record), Performing a medically appropriate examination and/or evaluation , Ordering medications/tests/procedures, referring and communicating with other health care professionals, Documenting clinical information in the EMR, Independently interpreting results (not separately reported), Communicating results to the patient/family/caregiver, Counseling and educating the patient/family/caregiver and Care coordination (not separately reported).    Dr. Drue Second is back tomorrow.      LOS: 0 days   Acey Lav 08/12/2023, 3:30 PM

## 2023-08-12 NOTE — Assessment & Plan Note (Signed)
-   s/p "CAR T cell therapy with [employer] on 09/26/2022 to which he achieved an MRD negative very good partial response"

## 2023-08-12 NOTE — Assessment & Plan Note (Signed)
-   Some suspicion for his cognitive changes being due to chronic ischemic changes in the brain and cognitive toxicities of CAR-T cell therapy

## 2023-08-12 NOTE — Progress Notes (Signed)
Progress Note    Andre [name]   WUJ:[id number]  DOB: 1951/08/01  DOA: 08/11/2023     0 PCP: [name], MD  Initial CC: fever, recurrent  Hospital Course: Andre [name] is a 72 yo male with PMH of relapsed/refractory IgG lambda MM (s/p 8th line therapy and CAR T cell therapy Jan 2024), protracted cytopenias, fuctional/cognitive decline, multiple recurrent infections. Also noted to have chronic left hiatal hernia, chronic elevated right hemidiaphragm, multiple unhealed pathologic fractures of the left ribs (7th - 9th), and bibasilar atelectasis.  Andre [name] has been having recurrent idiopathic fevers since CAR T therapy in January 2024.  Andre [name] has also had fevers not associated with active infections despite the recurrent infections Andre [name] has had. Andre [name] again was admitted with fevers at home and ID was consulted given unclear source once again.    Interval History:  Daughter present bedside.  She states Andre [name] is shivering/shaking which is not normal for him.  No noted fever documented since admission.  Andre [name] is also having generalized increased work of breathing and wheezing coming from Andre [name] neck.  Andre [name] is bearing down when breathing out against pursed lips and having difficulty not doing this when asked to try and breathe regular out of Andre [name] mouth.  Not sure if Andre [name] underlying cognitive deficits are interfering with Andre [name] ability for the following the instructions. Regardless Andre [name] is noted to not be in Andre [name] normal state per daughter.   Assessment and Plan: * Fever - FUO given chronicity. Etiology certainly wide. Infection by default will always be high, but it should be noted these fevers have persisted since CAR T therapy in Jan 2024 (?cytokine release syndrome). Andre [name] also does have chronic bibasilar atelectasis with elevated right hemidiaphragm and left hiatal hernia. Lastly, differential will include infectious workup as already being done (ID following also).  -From an infection standpoint, nothing acute seen on  CT angio chest.  UA technically showed moderate LE, negative nitrite, 21-50 WBC (grew Pseudo in Urine in Sept 2024). Might be worth abx for treatment? Will discuss with ID also   History of multiple myeloma - closely followed by oncology - on chronic antimicrobial prophylaxis with dapsone and Valtrex - Recent serum IFE performed 03/15/2023 showing faint IgG lambda monoclonal protein; Andre [name] is considered in remission -Follow-up repeat SPEP in process  Chronic respiratory failure with hypoxia (HCC) - Has home oxygen for intermittent use so does not appear that this is completely acute -With ongoing underlying rib fractures, chronic right hemidiaphragm, and bibasilar atelectasis Andre [name] continues to be at risk for ongoing hypoxia and generalized worsening respiratory status -Continue oxygen - Continue encouraging I-S use - Needs to also ambulate  Pancytopenia (HCC) - known and chronic; followed by oncology closely -Andre [name] is on weekly Nplate with average platelet count 20-30k -Monthly IVIG  Wheezing - appears mostly in Andre [name] neck, not pulm in origin; Andre [name]'s bearing down and blowing against pursed lips also causing louder wheezing; not consistent with stridor either - trial of morphine just b/c of Andre [name] WOB doing this - trial of duonebs regardless - teach IS use  Multiple rib fractures - per CTA chest: "Un-healed pathologic fractures of the left ribs 7 through 9, slightly more displaced since 08/05/2023."  History of engineered cell therapy infusion - s/p "CAR T cell therapy with [employer] on 09/26/2022 to which Andre [name] achieved an MRD negative very good partial response"  Mild cognitive impairment - Some suspicion for Andre [name] cognitive changes being due to chronic ischemic changes in the brain  and cognitive toxicities of CAR-T cell therapy  Hypothyroidism - Continue Synthroid  Diaphragmatic hernia - Needs incentive spirometer   Old records reviewed in assessment of this patient  Antimicrobials:   DVT  prophylaxis:  SCDs Start: 08/11/23 1455   Code Status:   Code Status: Limited: Do not attempt resuscitation (DNR) -DNR-LIMITED -Do Not Intubate/DNI   Mobility Assessment (Last 72 Hours)     Mobility Assessment     Row Name 08/12/23 0753 08/11/23 1915 08/11/23 1710       Does patient have an order for bedrest or is patient medically unstable No - Continue assessment No - Continue assessment No - Continue assessment     What is the highest level of mobility based on the progressive mobility assessment? Level 4 (Walks with assist in room) - Balance while marching in place and cannot step forward and back - Complete Level 4 (Walks with assist in room) - Balance while marching in place and cannot step forward and back - Complete Level 4 (Walks with assist in room) - Balance while marching in place and cannot step forward and back - Complete              Barriers to discharge: none Disposition Plan:  Home Status is: Obs  Objective: Blood pressure (!) 113/58, pulse 83, temperature 97.8 F (36.6 C), temperature source Oral, resp. rate 18, height 5\' 7"  (1.702 m), weight 80 kg, SpO2 93%.  Examination:  Physical Exam Constitutional:      General: Andre [name] is not in acute distress.    Appearance: Normal appearance.  HENT:     Head: Normocephalic and atraumatic.     Mouth/Throat:     Mouth: Mucous membranes are moist.  Eyes:     Extraocular Movements: Extraocular movements intact.  Cardiovascular:     Rate and Rhythm: Normal rate and regular rhythm.  Pulmonary:     Effort: Pulmonary effort is normal. No respiratory distress.     Breath sounds: Normal breath sounds. No wheezing.  Abdominal:     General: Bowel sounds are normal. There is no distension.     Palpations: Abdomen is soft.     Tenderness: There is no abdominal tenderness.  Musculoskeletal:        General: Normal range of motion.     Cervical back: Normal range of motion and neck supple.  Skin:    General: Skin is warm and  dry.  Neurological:     General: No focal deficit present.     Mental Status: Andre [name] is alert.  Psychiatric:        Mood and Affect: Mood normal.        Behavior: Behavior normal.      Consultants:  ID  Procedures:    Data Reviewed: Results for orders placed or performed during the hospital encounter of 08/11/23 (from the past 24 hour(s))  Urinalysis, w/ Reflex to Culture (Infection Suspected) -Urine, Clean Catch     Status: Abnormal   Collection Time: 08/11/23  1:29 PM  Result Value Ref Range   Specimen Source URINE, CLEAN CATCH    Color, Urine YELLOW YELLOW   APPearance HAZY (A) CLEAR   Specific Gravity, Urine 1.035 (H) 1.005 - 1.030   pH 5.0 5.0 - 8.0   Glucose, UA NEGATIVE NEGATIVE mg/dL   Hgb urine dipstick SMALL (A) NEGATIVE   Bilirubin Urine NEGATIVE NEGATIVE   Ketones, ur NEGATIVE NEGATIVE mg/dL   Protein, ur NEGATIVE NEGATIVE mg/dL   Nitrite NEGATIVE NEGATIVE  Leukocytes,Ua MODERATE (A) NEGATIVE   RBC / HPF 11-20 0 - 5 RBC/hpf   WBC, UA 21-50 0 - 5 WBC/hpf   Bacteria, UA RARE (A) NONE SEEN   Squamous Epithelial / HPF 0-5 0 - 5 /HPF   WBC Clumps PRESENT    Mucus PRESENT   Culture, blood (Routine X 2) w Reflex to ID Panel     Status: None (Preliminary result)   Collection Time: 08/11/23  6:16 PM   Specimen: BLOOD RIGHT HAND  Result Value Ref Range   Specimen Description      BLOOD RIGHT HAND Performed at [facility], [address], [place], [place] [address]    Special Requests      BOTTLES DRAWN AEROBIC ONLY Blood Culture adequate volume Performed at [facility], [address], [place], [place] [address]    Culture      NO GROWTH < 12 HOURS Performed at [facility], [address], [place], [place] [address]    Report Status PENDING   Culture, blood (Routine X 2) w Reflex to ID Panel     Status: None (Preliminary result)   Collection Time: 08/11/23  6:16 PM   Specimen: BLOOD LEFT HAND  Result Value Ref Range    Specimen Description      BLOOD LEFT HAND Performed at [facility], [address] [place], [place] [address]    Special Requests      BOTTLES DRAWN AEROBIC ONLY Blood Culture adequate volume Performed at [facility], [address], [place], [place] [address]    Culture      NO GROWTH < 12 HOURS Performed at [facility], [address] [place], [place] [address]    Report Status PENDING   HIV Antibody (routine testing w rflx)     Status: None   Collection Time: 08/11/23  6:16 PM  Result Value Ref Range   HIV Screen 4th Generation wRfx Non Reactive Non Reactive  Hepatitis panel, acute     Status: None   Collection Time: 08/11/23  6:16 PM  Result Value Ref Range   Hepatitis B Surface Ag NON REACTIVE NON REACTIVE   HCV Ab NON REACTIVE NON REACTIVE   Hep A IgM NON REACTIVE NON REACTIVE   Hep B C IgM NON REACTIVE NON REACTIVE  RPR     Status: None   Collection Time: 08/11/23  6:16 PM  Result Value Ref Range   RPR Ser Ql NON REACTIVE NON REACTIVE  Cryptococcal antigen     Status: None   Collection Time: 08/11/23  6:16 PM  Result Value Ref Range   Crypto Ag NEGATIVE NEGATIVE   Cryptococcal Ag Titer NOT INDICATED NOT INDICATED  Sedimentation rate     Status: Abnormal   Collection Time: 08/11/23  6:16 PM  Result Value Ref Range   Sed Rate 70 (H) 0 - 16 mm/hr  C-reactive protein     Status: Abnormal   Collection Time: 08/11/23  6:16 PM  Result Value Ref Range   CRP 5.7 (H) <1.0 mg/dL  Ferritin     Status: None   Collection Time: 08/11/23  6:16 PM  Result Value Ref Range   Ferritin 115 24 - 336 ng/mL  Basic metabolic panel     Status: Abnormal   Collection Time: 08/12/23  3:52 AM  Result Value Ref Range   Sodium 134 (L) 135 - 145 mmol/L   Potassium 3.5  3.5 - 5.1 mmol/L   Chloride 102 98 - 111 mmol/L   CO2 26 22 - 32 mmol/L   Glucose, Bld 97 70 - 99 mg/dL   BUN 13 8 - 23 mg/dL   Creatinine, Ser 1.91 0.61 - 1.24 mg/dL   Calcium 8.8 (L)  8.9 - 10.3 mg/dL   GFR, Estimated >47 >82 mL/min   Anion gap 6 5 - 15  CBC     Status: Abnormal   Collection Time: 08/12/23  3:52 AM  Result Value Ref Range   WBC 3.7 (L) 4.0 - 10.5 K/uL   RBC 2.14 (L) 4.22 - 5.81 MIL/uL   Hemoglobin 8.0 (L) 13.0 - 17.0 g/dL   HCT 95.6 (L) 21.3 - 08.6 %   MCV 113.6 (H) 80.0 - 100.0 fL   MCH 37.4 (H) 26.0 - 34.0 pg   MCHC 32.9 30.0 - 36.0 g/dL   RDW 57.8 (H) 46.9 - 62.9 %   Platelets 17 (LL) 150 - 400 K/uL   nRBC 0.8 (H) 0.0 - 0.2 %   *Note: Due to a large number of results and/or encounters for the requested time period, some results have not been displayed. A complete set of results can be found in Results Review.    I have reviewed pertinent nursing notes, vitals, labs, and images as necessary. I have ordered labwork to follow up on as indicated.  I have reviewed the last notes from staff over past 24 hours. I have discussed patient's care plan and test results with nursing staff, CM/SW, and other staff as appropriate.  Time spent: Greater than 50% of the 55 minute visit was spent in counseling/coordination of care for the patient as laid out in the A&P.   LOS: 0 days   Lewie Chamber, MD [facility] 08/12/2023, 12:56 PM

## 2023-08-12 NOTE — Progress Notes (Addendum)
NS called this nurse around 2050 stating pt had an unwitnessed fall. Pt was found laying on his back on the right side of the bed. Vital signs obtained BP 130/74, pulse 83, temp 98.6, O2 92%, resp 20. Head to toe assessment completed. No visible injuries or bruises noted. Pt stated " I was trying to get up to go to bathroom without assistance. I forgot to use the call bell." Assisted pt back to bed with 2 person assist. MD notified and obtained CT of head as ordered. Family Ms. Cannie (wife) called and updated as requested by pt. Pt A& O * 4. Able to move all extremities. PRN tylenol was effective for headache. No c/o pain or discomfort at this time. Bed at its lowest position. Bed alarm on with fall mats on bilateral sides of the bed. Pt education provided to use call bell for assistance. Pt verbalized understanding. Call bell and belongings near to reach. Will continue to monitor.

## 2023-08-12 NOTE — Assessment & Plan Note (Signed)
-   much improved and resolved today - continue IS

## 2023-08-12 NOTE — Assessment & Plan Note (Signed)
-   Has home oxygen for intermittent use so does not appear that this is completely acute -With ongoing underlying rib fractures, chronic right hemidiaphragm, and bibasilar atelectasis he continues to be at risk for ongoing hypoxia and generalized worsening respiratory status -Continue oxygen - Continue encouraging I-S use - Needs to also ambulate

## 2023-08-12 NOTE — Assessment & Plan Note (Addendum)
-  continue incentive spirometer

## 2023-08-12 NOTE — Plan of Care (Signed)
Problem: Nutrition: Goal: Adequate nutrition will be maintained Outcome: Progressing   Problem: Coping: Goal: Level of anxiety will decrease Outcome: Progressing

## 2023-08-12 NOTE — Hospital Course (Signed)
Andre [name] is a 72 yo male with PMH of relapsed/refractory IgG lambda MM (s/p 8th line therapy and CAR T cell therapy Jan 2024), protracted cytopenias, fuctional/cognitive decline, multiple recurrent infections. Also noted to have chronic left hiatal hernia, chronic elevated right hemidiaphragm, multiple unhealed pathologic fractures of the left ribs (7th - 9th), and bibasilar atelectasis.  He has been having recurrent idiopathic fevers since CAR T therapy in January 2024.  He has also had fevers not associated with active infections despite the recurrent infections he has had. He again was admitted with fevers at home and ID was consulted given unclear source once again.

## 2023-08-12 NOTE — Assessment & Plan Note (Addendum)
-   FUO given chronicity. Etiology certainly wide. Infection by default will always be high, but it should be noted these fevers have persisted since CAR T therapy in Jan 2024 (?cytokine release syndrome). He also does have chronic bibasilar atelectasis with elevated right hemidiaphragm and left hiatal hernia. Lastly, differential will include infectious workup as already being done (ID following also).  -Given overall negative infectious workup, still suspecting this is something immune mediated from underlying prior cancer and the treatments; might have some chronic component at this time? -From an infection standpoint, nothing acute seen on CT angio chest.  UA technically showed moderate LE, negative nitrite, 21-50 WBC (grew Pseudo in Urine in Sept 2024). No urinary symptoms and he seems improved today in general without antibiotics - low grade fever over past 24 hrs, 100.4

## 2023-08-12 NOTE — Assessment & Plan Note (Signed)
-   closely followed by oncology - on chronic antimicrobial prophylaxis with dapsone and Valtrex - Recent serum IFE performed 03/15/2023 showing faint IgG lambda monoclonal protein; he is considered in remission -Follow-up repeat SPEP in process

## 2023-08-12 NOTE — Assessment & Plan Note (Signed)
-   per CTA chest: "Un-healed pathologic fractures of the left ribs 7 through 9, slightly more displaced since 08/05/2023."

## 2023-08-12 NOTE — Assessment & Plan Note (Signed)
-   known and chronic; followed by oncology closely -He is on weekly Nplate with average platelet count 20-30k -Monthly IVIG

## 2023-08-12 NOTE — Plan of Care (Signed)
Problem: Coping: Goal: Level of anxiety will decrease Outcome: Progressing   Problem: Elimination: Goal: Will not experience complications related to bowel motility Outcome: Progressing Goal: Will not experience complications related to urinary retention Outcome: Progressing   Problem: Pain Management: Goal: General experience of comfort will improve Outcome: Progressing

## 2023-08-12 NOTE — Assessment & Plan Note (Signed)
Continue Synthroid °

## 2023-08-13 ENCOUNTER — Telehealth: Payer: Self-pay | Admitting: *Deleted

## 2023-08-13 DIAGNOSIS — C9000 Multiple myeloma not having achieved remission: Secondary | ICD-10-CM

## 2023-08-13 DIAGNOSIS — R509 Fever, unspecified: Secondary | ICD-10-CM | POA: Diagnosis not present

## 2023-08-13 LAB — CBC WITH DIFFERENTIAL/PLATELET
Abs Immature Granulocytes: 0.25 10*3/uL — ABNORMAL HIGH (ref 0.00–0.07)
Basophils Absolute: 0 10*3/uL (ref 0.0–0.1)
Basophils Relative: 1 %
Eosinophils Absolute: 0 10*3/uL (ref 0.0–0.5)
Eosinophils Relative: 1 %
HCT: 25.5 % — ABNORMAL LOW (ref 39.0–52.0)
Hemoglobin: 8 g/dL — ABNORMAL LOW (ref 13.0–17.0)
Immature Granulocytes: 8 %
Lymphocytes Relative: 10 %
Lymphs Abs: 0.3 10*3/uL — ABNORMAL LOW (ref 0.7–4.0)
MCH: 36.4 pg — ABNORMAL HIGH (ref 26.0–34.0)
MCHC: 31.4 g/dL (ref 30.0–36.0)
MCV: 115.9 fL — ABNORMAL HIGH (ref 80.0–100.0)
Monocytes Absolute: 0.5 10*3/uL (ref 0.1–1.0)
Monocytes Relative: 15 %
Neutro Abs: 2.1 10*3/uL (ref 1.7–7.7)
Neutrophils Relative %: 65 %
Platelets: 19 10*3/uL — CL (ref 150–400)
RBC: 2.2 MIL/uL — ABNORMAL LOW (ref 4.22–5.81)
RDW: 16.1 % — ABNORMAL HIGH (ref 11.5–15.5)
WBC: 3.3 10*3/uL — ABNORMAL LOW (ref 4.0–10.5)
nRBC: 0.6 % — ABNORMAL HIGH (ref 0.0–0.2)

## 2023-08-13 LAB — BASIC METABOLIC PANEL
Anion gap: 7 (ref 5–15)
BUN: 15 mg/dL (ref 8–23)
CO2: 25 mmol/L (ref 22–32)
Calcium: 9.1 mg/dL (ref 8.9–10.3)
Chloride: 105 mmol/L (ref 98–111)
Creatinine, Ser: 0.74 mg/dL (ref 0.61–1.24)
GFR, Estimated: >60 mL/min (ref >60)
Glucose, Bld: 97 mg/dL (ref 70–99)
Potassium: 3.5 mmol/L (ref 3.5–5.1)
Sodium: 137 mmol/L (ref 135–145)

## 2023-08-13 LAB — EPSTEIN-BARR VIRUS (EBV) ANTIBODY PROFILE
EBV NA IgG: 414 U/mL — ABNORMAL HIGH (ref 0.0–17.9)
EBV VCA IgG: 296 U/mL — ABNORMAL HIGH (ref 0.0–17.9)
EBV VCA IgM: <36 U/mL (ref 0.0–35.9)

## 2023-08-13 LAB — ANGIOTENSIN CONVERTING ENZYME: Angiotensin-Converting Enzyme: 80 U/L (ref 14–82)

## 2023-08-13 LAB — ANCA PROFILE
Anti-MPO Antibodies: <0.2 U (ref 0.0–0.9)
Anti-PR3 Antibodies: <0.2 U (ref 0.0–0.9)
Atypical P-ANCA titer: <1:20 {titer}
C-ANCA: <1:20 {titer}
P-ANCA: <1:20 {titer}

## 2023-08-13 LAB — ANA W/REFLEX IF POSITIVE: Anti Nuclear Antibody (ANA): NEGATIVE

## 2023-08-13 LAB — MAGNESIUM: Magnesium: 2.2 mg/dL (ref 1.7–2.4)

## 2023-08-13 LAB — CMV ANTIBODY, IGG (EIA): CMV Ab - IgG: 3.3 U/mL — ABNORMAL HIGH (ref 0.00–0.59)

## 2023-08-13 LAB — CMV IGM: CMV IgM: <30 [AU]/ml (ref 0.0–29.9)

## 2023-08-13 MED ORDER — IPRATROPIUM-ALBUTEROL 0.5-2.5 (3) MG/3ML IN SOLN
3.0000 mL | Freq: Two times a day (BID) | RESPIRATORY_TRACT | Status: DC
  Administered 2023-08-13 – 2023-08-16 (×7): 3 mL via RESPIRATORY_TRACT
  Filled 2023-08-13 (×7): qty 3

## 2023-08-13 NOTE — Progress Notes (Signed)
[facility]    Date of Admission:  08/11/2023   Total days of antibiotics 0   ID: Andre [name] is a 72 y.o. male with   Principal Problem:   FUO (fever of unknown origin) Active Problems:   Diaphragmatic hernia   Pancytopenia (HCC)   Hypothyroidism   Mild cognitive impairment   History of engineered cell therapy infusion   Multiple rib fractures   Chronic respiratory failure with hypoxia (HCC)   History of multiple myeloma   Wheezing   Multiple myeloma in remission (HCC)    Subjective: Afebrile. Feels like back to baseline health   Medications:   cyanocobalamin  1,000 mcg Oral QAC breakfast   dapsone  100 mg Oral Daily   escitalopram  10 mg Oral Daily   ferrous sulfate  325 mg Oral BID   folic acid  1 mg Oral QAC breakfast   ipratropium-albuterol  3 mL Nebulization BID   levothyroxine  125 mcg Oral Q0600   methylphenidate  2.5 mg Oral Daily   midodrine  2.5 mg Oral BID WC    morphine injection  2 mg Intravenous Once   tamsulosin  0.4 mg Oral QHS   valACYclovir  500 mg Oral QAC breakfast    Objective: Vital signs in last 24 hours: Temp:  [98.1 F (36.7 C)-98.4 F (36.9 C)] 98.4 F (36.9 C) (11/25 1344) Pulse Rate:  [66-77] 77 (11/25 1344) Resp:  [15-17] 15 (11/25 1344) BP: (115-132)/(57-66) 123/66 (11/25 1344) SpO2:  [92 %-95 %] 94 % (11/25 1344)  Physical Exam  Constitutional: He is oriented to person, place, and time. He appears well-developed and well-nourished. No distress.  HENT:  Mouth/Throat: Oropharynx is clear and moist. No oropharyngeal exudate.  Cardiovascular: Normal rate, regular rhythm and normal heart sounds. Exam reveals no gallop and no friction rub.  No murmur heard.  Pulmonary/Chest: Effort normal and breath sounds normal. No respiratory distress. He has no wheezes.  Abdominal: Soft. Bowel sounds are normal. He exhibits no distension. There is no tenderness.  Lymphadenopathy:  He has no cervical  adenopathy.  Neurological: He is alert and oriented to person, place, and time.  Skin: Skin is warm and dry. No rash noted. No erythema.  Psychiatric: He has a normal mood and affect. His behavior is normal.    Lab Results Recent Labs    08/12/23 0352 08/13/23 0303  WBC 3.7* 3.3*  HGB 8.0* 8.0*  HCT 24.3* 25.5*  NA 134* 137  K 3.5 3.5  CL 102 105  CO2 26 25  BUN 13 15  CREATININE 0.73 0.74   Liver Panel Recent Labs    08/11/23 1118  PROT 6.2*  ALBUMIN 3.3*  AST 19  ALT 10  ALKPHOS 64  BILITOT 0.5   Sedimentation Rate Recent Labs    08/11/23 1816  ESRSEDRATE 70*   C-Reactive Protein Recent Labs    08/11/23 1816  CRP 5.7*    Microbiology: reviewed Studies/Results: CT HEAD WO CONTRAST ( )  Result Date: 08/11/2023 CLINICAL DATA:  Unwitnessed fall EXAM: CT HEAD WITHOUT CONTRAST TECHNIQUE: Contiguous axial images were obtained from the base of the skull through the vertex without intravenous contrast. RADIATION DOSE REDUCTION: This exam was performed according to the departmental dose-optimization program which includes automated exposure control, adjustment of the mA and/or kV according to patient size and/or use of iterative reconstruction technique. COMPARISON:  05/23/2023 FINDINGS: Brain: No evidence of acute infarction, hemorrhage, mass, mass effect, or midline  shift. No hydrocephalus or extra-axial fluid collection. Periventricular white matter changes, likely the sequela of chronic small vessel ischemic disease. Redemonstrated remote left cerebellar infarct. Vascular: No hyperdense vessel. Skull: Negative for fracture. Redemonstrated lucencies in the calvarium, consistent with the patient's history of multiple myeloma. Sinuses/Orbits: Mucosal thickening in the ethmoid air cells. No acute finding in the orbits. Other: The mastoid air cells are well aerated. IMPRESSION: No acute intracranial process. Electronically Signed   By: Wiliam Ke M.D.   On: 08/11/2023  22:20     Assessment/Plan: Fever of unknown origin = has remained off of abtx. Thus far, blood cx remain NGTD at 48hrs. Continue to monitor off of abtx for the time being.  [facility] Pager: [phone]  08/13/2023, 5:18 PM

## 2023-08-13 NOTE — Plan of Care (Signed)
  Problem: Activity: Goal: Risk for activity intolerance will decrease Outcome: Progressing   Problem: Pain Management: Goal: General experience of comfort will improve Outcome: Progressing   Problem: Safety: Goal: Ability to remain free from injury will improve Outcome: Progressing

## 2023-08-13 NOTE — Telephone Encounter (Signed)
Informed Mrs. Villa that Dr. Truett Perna doubts the fevers are due to Nplate. Has not noted this side effect. He also doubts that hiatal hernia is causing his sats to drop either.

## 2023-08-13 NOTE — Plan of Care (Signed)
  Problem: Nutrition: Goal: Adequate nutrition will be maintained Outcome: Progressing   Problem: Coping: Goal: Level of anxiety will decrease Outcome: Progressing   

## 2023-08-13 NOTE — TOC Initial Note (Signed)
Transition of Care [facility]) - Initial/Assessment Note    Patient Details  Name: Andre [name] MRN: [id number] Date of Birth: [birth year]  Transition of Care [facility]) CM/SW Contact:    Amada Jupiter, LCSW Phone Number: 08/13/2023, 1:10 PM  Clinical Narrative:                  Met with pt today to review dc planning needs.  Pt confirms that he has been in the SNF level at [facility] since 05/25/23, however, has had a few hospital admissions during this time as well.  Pt fully plans to return to this SNF once medically cleared.  Have confirmed with [facility] that they will plan to readmit when pt ready.  Have left a VM for pt's wife to touch base.  Per MD, pt may be ready in a couple of days.  Expected Discharge Plan: Skilled Nursing Facility Barriers to Discharge: Continued Medical Work up   Patient Goals and CMS Choice Patient states their goals for this hospitalization and ongoing recovery are:: return to SNF     [facility] ownership interest in [facility]provided to:: Patient    Expected Discharge Plan and Services In-house Referral: Clinical Social Work   Post Acute Care Choice: Skilled Nursing Facility Living arrangements for the past 2 months: Skilled Nursing Facility (at [facility] SNF since 05/25/23)                                      Prior Living Arrangements/Services Living arrangements for the past 2 months: Skilled Nursing Facility (at [facility] SNF since 05/25/23) Lives with:: Facility Resident Patient language and need for interpreter reviewed:: Yes Do you feel safe going back to the place where you live?: Yes      Need for Family Participation in Patient Care: No (Comment) Care giver support system in place?: Yes (comment)   Criminal Activity/Legal Involvement Pertinent to Current Situation/Hospitalization: No - Comment as needed  Activities of Daily Living   ADL Screening (condition at time of admission) Independently performs ADLs?: Yes  (appropriate for developmental age) Does the patient have a NEW difficulty with bathing/dressing/toileting/self-feeding that is expected to last >3 days?: No Does the patient have a NEW difficulty with getting in/out of bed, walking, or climbing stairs that is expected to last >3 days?: No Does the patient have a NEW difficulty with communication that is expected to last >3 days?: No Is the patient deaf or have difficulty hearing?: No Does the patient have difficulty seeing, even when wearing glasses/contacts?: No Does the patient have difficulty concentrating, remembering, or making decisions?: No  Permission Sought/Granted Permission sought to share information with : Facility Medical sales representative, Family Supports Permission granted to share information with : Yes, Verbal Permission Granted  Share Information with NAME: wife, Jawaan Lanfear @ [phone]  Permission granted to share info w AGENCY: [facility]        Emotional Assessment Appearance:: Appears stated age Attitude/Demeanor/Rapport: Engaged, Gracious Affect (typically observed): Accepting Orientation: : Oriented to Self, Oriented to Place, Oriented to  Time, Oriented to Situation Alcohol / Substance Use: Not Applicable Psych Involvement: No (comment)  Admission diagnosis:  Hypoxia [R09.02] Fever [R50.9] Fever, unspecified fever cause [R50.9] Patient Active Problem List   Diagnosis Date Noted   Chronic respiratory failure with hypoxia (HCC) 08/12/2023   History of multiple myeloma 08/12/2023   Wheezing 08/12/2023  Multiple myeloma in remission (HCC) 08/12/2023   FUO (fever of unknown origin) 08/11/2023   SIRS (systemic inflammatory response syndrome) (HCC) 08/04/2023   Oral thrush 06/19/2023   Candidiasis of mouth 06/19/2023   Multiple rib fractures 06/16/2023   Hypotension 06/15/2023   Positive blood culture 06/15/2023   Anemia associated with multiple myeloma treated with erythropoietin (HCC)  06/14/2023   DNR (do not resuscitate)/DNI(Do Not Intubate) 05/20/2023   Severe sepsis (HCC) 05/20/2023   Acute cystitis without hematuria 05/20/2023   Moderate-to-severe aortic regurgitation 05/20/2023   Macrocytic anemia 05/20/2023   Acute hypoxemic respiratory failure (HCC) 05/05/2023   Reduced mobility 10/13/2022   Self-care deficit 10/13/2022   Physical deconditioning 10/02/2022   History of engineered cell therapy infusion 09/26/2022   Meteorism 02/09/2022   Mild cognitive impairment 08/30/2021   Hemorrhoids 11/20/2019   Neuropathy due to chemotherapeutic drug (HCC) 09/01/2019   Dry skin dermatitis 08/16/2016   Peripheral edema 04/26/2016   Bone marrow transplant status (HCC) 03/14/2016   Debility 02/26/2016   Back pain 02/26/2016   Patient in clinical research study 01/10/2016   GERD (gastroesophageal reflux disease)    Weakness 12/22/2015   Hyponatremia 12/22/2015   General weakness    Multiple myeloma not having achieved remission (HCC)    Chronic anemia    Constipation    Thrombocytopenia (HCC) 11/05/2015   Actinic keratosis 07/08/2015   Raynaud's phenomenon 09/17/2013   Cough 01/10/2013   Vitamin D deficiency 09/30/2012   Hypothyroidism 09/30/2012   Hypogonadism male 09/30/2012   Fatigue 09/27/2012   Insomnia 09/27/2012   Reactive depression (situational) 09/27/2012   Pancytopenia (HCC) 03/27/2012   Hyperlipidemia 05/30/2010   Anemia of chronic disease 05/30/2010   Asthma 05/30/2010   SCHATZKI'S RING 05/30/2010   Peptic ulcer disease 05/30/2010   Diaphragmatic hernia 05/30/2010   Sleep apnea 05/30/2010   TRANSAMINASES, SERUM, ELEVATED 05/30/2010   PCP:  [name], MD Pharmacy:   [facility] [address] [place] [address] Phone: [phone] Fax: [phone]     Social Determinants of Health (SDOH) Social History: SDOH Screenings   Food Insecurity: No Food Insecurity (08/11/2023)   Housing: Low Risk  (08/11/2023)  Transportation Needs: No Transportation Needs (08/11/2023)  Utilities: Not At Risk (08/11/2023)  Depression (PHQ2-9): Low Risk  (06/08/2022)  Tobacco Use: Medium Risk (08/11/2023)   SDOH Interventions: Food Insecurity Interventions: Intervention Not Indicated Housing Interventions: Intervention Not Indicated Transportation Interventions: Intervention Not Indicated Utilities Interventions: Intervention Not Indicated   Readmission Risk Interventions    06/20/2023   12:59 PM  Readmission Risk Prevention Plan  Transportation Screening Complete  PCP or Specialist Appt within 3-5 Days Complete  HRI or Home Care Consult Complete  Social Work Consult for Recovery Care Planning/Counseling Complete  Palliative Care Screening Not Applicable  Medication Review Oceanographer) Complete

## 2023-08-13 NOTE — Progress Notes (Signed)
   08/13/23 0441  Provider Notification  Provider Name/Title Johann Capers NP  Date Provider Notified 08/13/23  Time Provider Notified [phone]  Method of Notification Page (secure chat)  Notification Reason Critical Result (19)  Date Critical Result Received 08/13/23  Time Critical Result Received 0415  Provider response Other (Comment) (await)  Date of Provider Response 08/13/23  Time of Provider Response [phone]

## 2023-08-13 NOTE — Progress Notes (Signed)
Progress Note    JAETYN [name]   OZH:[id number]  DOB: [birth year]  DOA: 08/11/2023     0 PCP: [name], MD  Initial CC: fever, recurrent  Hospital Course: [name] is a 72 yo male with PMH of relapsed/refractory IgG lambda MM (s/p 8th line therapy and CAR T cell therapy Jan 2024), protracted cytopenias, fuctional/cognitive decline, multiple recurrent infections. Also noted to have chronic left hiatal hernia, chronic elevated right hemidiaphragm, multiple unhealed pathologic fractures of the left ribs (7th - 9th), and bibasilar atelectasis.  He has been having recurrent idiopathic fevers since CAR T therapy in January 2024.  He has also had fevers not associated with active infections despite the recurrent infections he has had. He again was admitted with fevers at home and ID was consulted given unclear source once again.   Interval History:  Fever noted overnight. Breathing is much better today. No wheezing and he's breathing normally.   Assessment and Plan: * FUO (fever of unknown origin) - FUO given chronicity. Etiology certainly wide. Infection by default will always be high, but it should be noted these fevers have persisted since CAR T therapy in Jan 2024 (?cytokine release syndrome). He also does have chronic bibasilar atelectasis with elevated right hemidiaphragm and left hiatal hernia. Lastly, differential will include infectious workup as already being done (ID following also).  -From an infection standpoint, nothing acute seen on CT angio chest.  UA technically showed moderate LE, negative nitrite, 21-50 WBC (grew Pseudo in Urine in Sept 2024). No urinary symptoms and he seems improved today in general without antibiotics - however, he did have fever noted to 101.6 since yesterday   History of multiple myeloma - closely followed by oncology - on chronic antimicrobial prophylaxis with dapsone and Valtrex - Recent serum IFE performed 03/15/2023 showing faint IgG  lambda monoclonal protein; he is considered in remission -Follow-up repeat SPEP in process  Chronic respiratory failure with hypoxia (HCC) - Has home oxygen for intermittent use so does not appear that this is completely acute -With ongoing underlying rib fractures, chronic right hemidiaphragm, and bibasilar atelectasis he continues to be at risk for ongoing hypoxia and generalized worsening respiratory status -Continue oxygen - Continue encouraging I-S use - Needs to also ambulate  Pancytopenia (HCC) - known and chronic; followed by oncology closely -He is on weekly Nplate with average platelet count 20-30k -Monthly IVIG  Wheezing - much improved and resolved today - continue IS  Multiple rib fractures - per CTA chest: "Un-healed pathologic fractures of the left ribs 7 through 9, slightly more displaced since 08/05/2023."  History of engineered cell therapy infusion - s/p "CAR T cell therapy with [place] on 09/26/2022 to which he achieved an MRD negative very good partial response"  Mild cognitive impairment - Some suspicion for his cognitive changes being due to chronic ischemic changes in the brain and cognitive toxicities of CAR-T cell therapy  Hypothyroidism - Continue Synthroid  Diaphragmatic hernia - continue incentive spirometer   Old records reviewed in assessment of this patient  Antimicrobials:   DVT prophylaxis:  SCDs Start: 08/11/23 1455   Code Status:   Code Status: Limited: Do not attempt resuscitation (DNR) -DNR-LIMITED -Do Not Intubate/DNI   Mobility Assessment (Last 72 Hours)     Mobility Assessment     Row Name 08/13/23 0900 08/13/23 0742 08/12/23 2000 08/12/23 0753 08/11/23 1915   Does patient have an order for bedrest or is patient medically unstable No - Continue assessment No -  Continue assessment No - Continue assessment No - Continue assessment No - Continue assessment   What is the highest level of mobility based on the progressive  mobility assessment? Level 4 (Walks with assist in room) - Balance while marching in place and cannot step forward and back - Complete Level 4 (Walks with assist in room) - Balance while marching in place and cannot step forward and back - Complete Level 4 (Walks with assist in room) - Balance while marching in place and cannot step forward and back - Complete Level 4 (Walks with assist in room) - Balance while marching in place and cannot step forward and back - Complete Level 4 (Walks with assist in room) - Balance while marching in place and cannot step forward and back - Complete    Row Name 08/11/23 1710           Does patient have an order for bedrest or is patient medically unstable No - Continue assessment       What is the highest level of mobility based on the progressive mobility assessment? Level 4 (Walks with assist in room) - Balance while marching in place and cannot step forward and back - Complete                Barriers to discharge: none Disposition Plan:  Home Status is: Obs  Objective: Blood pressure 123/66, pulse 77, temperature 98.4 F (36.9 C), resp. rate 15, height 5\' 7"  (1.702 m), weight 80 kg, SpO2 94%.  Examination:  Physical Exam Constitutional:      General: He is not in acute distress.    Appearance: Normal appearance.  HENT:     Head: Normocephalic and atraumatic.     Mouth/Throat:     Mouth: Mucous membranes are moist.  Eyes:     Extraocular Movements: Extraocular movements intact.  Cardiovascular:     Rate and Rhythm: Normal rate and regular rhythm.  Pulmonary:     Effort: Pulmonary effort is normal. No respiratory distress.     Breath sounds: Normal breath sounds. No wheezing.  Abdominal:     General: Bowel sounds are normal. There is no distension.     Palpations: Abdomen is soft.     Tenderness: There is no abdominal tenderness.  Musculoskeletal:        General: Normal range of motion.     Cervical back: Normal range of motion and neck  supple.  Skin:    General: Skin is warm and dry.  Neurological:     General: No focal deficit present.     Mental Status: He is alert.  Psychiatric:        Mood and Affect: Mood normal.        Behavior: Behavior normal.      Consultants:  ID  Procedures:    Data Reviewed: Results for orders placed or performed during the hospital encounter of 08/11/23 (from the past 24 hour(s))  Basic metabolic panel     Status: None   Collection Time: 08/13/23  3:03 AM  Result Value Ref Range   Sodium 137 135 - 145 mmol/L   Potassium 3.5 3.5 - 5.1 mmol/L   Chloride 105 98 - 111 mmol/L   CO2 25 22 - 32 mmol/L   Glucose, Bld 97 70 - 99 mg/dL   BUN 15 8 - 23 mg/dL   Creatinine, Ser 6.29 0.61 - 1.24 mg/dL   Calcium 9.1 8.9 - 52.8 mg/dL   GFR, Estimated >41 >32  mL/min   Anion gap 7 5 - 15  CBC with Differential/Platelet     Status: Abnormal   Collection Time: 08/13/23  3:03 AM  Result Value Ref Range   WBC 3.3 (L) 4.0 - 10.5 K/uL   RBC 2.20 (L) 4.22 - 5.81 MIL/uL   Hemoglobin 8.0 (L) 13.0 - 17.0 g/dL   HCT 09.8 (L) 11.9 - 14.7 %   MCV 115.9 (H) 80.0 - 100.0 fL   MCH 36.4 (H) 26.0 - 34.0 pg   MCHC 31.4 30.0 - 36.0 g/dL   RDW 82.9 (H) 56.2 - 13.0 %   Platelets 19 (LL) 150 - 400 K/uL   nRBC 0.6 (H) 0.0 - 0.2 %   Neutrophils Relative % 65 %   Neutro Abs 2.1 1.7 - 7.7 K/uL   Lymphocytes Relative 10 %   Lymphs Abs 0.3 (L) 0.7 - 4.0 K/uL   Monocytes Relative 15 %   Monocytes Absolute 0.5 0.1 - 1.0 K/uL   Eosinophils Relative 1 %   Eosinophils Absolute 0.0 0.0 - 0.5 K/uL   Basophils Relative 1 %   Basophils Absolute 0.0 0.0 - 0.1 K/uL   Immature Granulocytes 8 %   Abs Immature Granulocytes 0.25 (H) 0.00 - 0.07 K/uL  Magnesium     Status: None   Collection Time: 08/13/23  3:03 AM  Result Value Ref Range   Magnesium 2.2 1.7 - 2.4 mg/dL   *Note: Due to a large number of results and/or encounters for the requested time period, some results have not been displayed. A complete set of  results can be found in Results Review.    I have reviewed pertinent nursing notes, vitals, labs, and images as necessary. I have ordered labwork to follow up on as indicated.  I have reviewed the last notes from staff over past 24 hours. I have discussed patient's care plan and test results with nursing staff, CM/SW, and other staff as appropriate.  Time spent: Greater than 50% of the 55 minute visit was spent in counseling/coordination of care for the patient as laid out in the A&P.   LOS: 0 days   Lewie Chamber, MD [facility] 08/13/2023, 2:44 PM

## 2023-08-13 NOTE — Telephone Encounter (Signed)
Mrs. Clinkscales called to inquire if the Nplate could be causing his fevers and arthralgias? Has happened within 1-2 days of last two injections of Nplate. Also asking if the hiatal hernia noted on his chest CT could be contributing to his oxygen sat dropping?

## 2023-08-14 DIAGNOSIS — Z881 Allergy status to other antibiotic agents status: Secondary | ICD-10-CM | POA: Diagnosis not present

## 2023-08-14 DIAGNOSIS — D696 Thrombocytopenia, unspecified: Secondary | ICD-10-CM | POA: Diagnosis not present

## 2023-08-14 DIAGNOSIS — M8458XG Pathological fracture in neoplastic disease, other specified site, subsequent encounter for fracture with delayed healing: Secondary | ICD-10-CM | POA: Diagnosis present

## 2023-08-14 DIAGNOSIS — B9620 Unspecified Escherichia coli [E. coli] as the cause of diseases classified elsewhere: Secondary | ICD-10-CM | POA: Diagnosis present

## 2023-08-14 DIAGNOSIS — D61818 Other pancytopenia: Secondary | ICD-10-CM | POA: Diagnosis present

## 2023-08-14 DIAGNOSIS — R509 Fever, unspecified: Secondary | ICD-10-CM | POA: Diagnosis present

## 2023-08-14 DIAGNOSIS — Z1152 Encounter for screening for COVID-19: Secondary | ICD-10-CM | POA: Diagnosis not present

## 2023-08-14 DIAGNOSIS — Z9481 Bone marrow transplant status: Secondary | ICD-10-CM | POA: Diagnosis not present

## 2023-08-14 DIAGNOSIS — C9000 Multiple myeloma not having achieved remission: Secondary | ICD-10-CM | POA: Diagnosis not present

## 2023-08-14 DIAGNOSIS — Z87891 Personal history of nicotine dependence: Secondary | ICD-10-CM | POA: Diagnosis not present

## 2023-08-14 DIAGNOSIS — K449 Diaphragmatic hernia without obstruction or gangrene: Secondary | ICD-10-CM | POA: Diagnosis present

## 2023-08-14 DIAGNOSIS — Z8579 Personal history of other malignant neoplasms of lymphoid, hematopoietic and related tissues: Secondary | ICD-10-CM | POA: Diagnosis not present

## 2023-08-14 DIAGNOSIS — J9811 Atelectasis: Secondary | ICD-10-CM | POA: Diagnosis present

## 2023-08-14 DIAGNOSIS — Z8249 Family history of ischemic heart disease and other diseases of the circulatory system: Secondary | ICD-10-CM | POA: Diagnosis not present

## 2023-08-14 DIAGNOSIS — J9621 Acute and chronic respiratory failure with hypoxia: Secondary | ICD-10-CM | POA: Diagnosis present

## 2023-08-14 DIAGNOSIS — J9611 Chronic respiratory failure with hypoxia: Secondary | ICD-10-CM | POA: Diagnosis not present

## 2023-08-14 DIAGNOSIS — N390 Urinary tract infection, site not specified: Secondary | ICD-10-CM | POA: Diagnosis present

## 2023-08-14 DIAGNOSIS — I10 Essential (primary) hypertension: Secondary | ICD-10-CM | POA: Diagnosis present

## 2023-08-14 DIAGNOSIS — Z66 Do not resuscitate: Secondary | ICD-10-CM | POA: Diagnosis present

## 2023-08-14 DIAGNOSIS — K219 Gastro-esophageal reflux disease without esophagitis: Secondary | ICD-10-CM | POA: Diagnosis present

## 2023-08-14 DIAGNOSIS — J690 Pneumonitis due to inhalation of food and vomit: Secondary | ICD-10-CM | POA: Diagnosis present

## 2023-08-14 DIAGNOSIS — E039 Hypothyroidism, unspecified: Secondary | ICD-10-CM | POA: Diagnosis present

## 2023-08-14 DIAGNOSIS — Z79890 Hormone replacement therapy (postmenopausal): Secondary | ICD-10-CM | POA: Diagnosis not present

## 2023-08-14 DIAGNOSIS — Z825 Family history of asthma and other chronic lower respiratory diseases: Secondary | ICD-10-CM | POA: Diagnosis not present

## 2023-08-14 DIAGNOSIS — E876 Hypokalemia: Secondary | ICD-10-CM | POA: Diagnosis present

## 2023-08-14 DIAGNOSIS — C9001 Multiple myeloma in remission: Secondary | ICD-10-CM | POA: Diagnosis present

## 2023-08-14 DIAGNOSIS — E785 Hyperlipidemia, unspecified: Secondary | ICD-10-CM | POA: Diagnosis present

## 2023-08-14 DIAGNOSIS — F0150 Vascular dementia without behavioral disturbance: Secondary | ICD-10-CM | POA: Diagnosis present

## 2023-08-14 DIAGNOSIS — D801 Nonfamilial hypogammaglobulinemia: Secondary | ICD-10-CM | POA: Diagnosis present

## 2023-08-14 LAB — CBC WITH DIFFERENTIAL/PLATELET
Abs Immature Granulocytes: 0 10*3/uL (ref 0.00–0.07)
Basophils Absolute: 0 10*3/uL (ref 0.0–0.1)
Basophils Relative: 1 %
Eosinophils Absolute: 0 10*3/uL (ref 0.0–0.5)
Eosinophils Relative: 1 %
HCT: 23.9 % — ABNORMAL LOW (ref 39.0–52.0)
Hemoglobin: 8 g/dL — ABNORMAL LOW (ref 13.0–17.0)
Immature Granulocytes: 0 %
Lymphocytes Relative: 9 %
Lymphs Abs: 0.3 10*3/uL — ABNORMAL LOW (ref 0.7–4.0)
MCH: 38.1 pg — ABNORMAL HIGH (ref 26.0–34.0)
MCHC: 33.5 g/dL (ref 30.0–36.0)
MCV: 113.8 fL — ABNORMAL HIGH (ref 80.0–100.0)
Monocytes Absolute: 0.7 10*3/uL (ref 0.1–1.0)
Monocytes Relative: 16 %
Neutro Abs: 3 10*3/uL (ref 1.7–7.7)
Neutrophils Relative %: 73 %
Platelets: 17 10*3/uL — CL (ref 150–400)
RBC: 2.1 MIL/uL — ABNORMAL LOW (ref 4.22–5.81)
RDW: 16 % — ABNORMAL HIGH (ref 11.5–15.5)
WBC: 4 10*3/uL (ref 4.0–10.5)
nRBC: 0.5 % — ABNORMAL HIGH (ref 0.0–0.2)

## 2023-08-14 LAB — BASIC METABOLIC PANEL
Anion gap: 7 (ref 5–15)
BUN: 16 mg/dL (ref 8–23)
CO2: 26 mmol/L (ref 22–32)
Calcium: 8.8 mg/dL — ABNORMAL LOW (ref 8.9–10.3)
Chloride: 102 mmol/L (ref 98–111)
Creatinine, Ser: 0.86 mg/dL (ref 0.61–1.24)
GFR, Estimated: >60 mL/min (ref >60)
Glucose, Bld: 104 mg/dL — ABNORMAL HIGH (ref 70–99)
Potassium: 3.6 mmol/L (ref 3.5–5.1)
Sodium: 135 mmol/L (ref 135–145)

## 2023-08-14 LAB — MAGNESIUM: Magnesium: 2.2 mg/dL (ref 1.7–2.4)

## 2023-08-14 LAB — RHEUMATOID FACTOR: Rheumatoid fact SerPl-aCnc: <10 [IU]/mL (ref <14.0)

## 2023-08-14 MED ORDER — CALCIUM CARBONATE ANTACID 500 MG PO CHEW
3.0000 | CHEWABLE_TABLET | Freq: Three times a day (TID) | ORAL | Status: DC | PRN
Start: 2023-08-14 — End: 2023-08-17
  Administered 2023-08-14: 600 mg via ORAL
  Filled 2023-08-14: qty 3

## 2023-08-14 MED ORDER — PANTOPRAZOLE SODIUM 40 MG PO TBEC
40.0000 mg | DELAYED_RELEASE_TABLET | Freq: Every day | ORAL | Status: DC
  Administered 2023-08-14 – 2023-08-17 (×4): 40 mg via ORAL
  Filled 2023-08-14 (×4): qty 1

## 2023-08-14 NOTE — Progress Notes (Signed)
Progress Note    Andre [name]   QMV:[id number]  DOB: 10-04-1950  DOA: 08/11/2023     0 PCP: [name], MD  Initial CC: fever, recurrent  Hospital Course: Andre [name] is a 72 yo male with PMH of relapsed/refractory IgG lambda MM (s/p 8th line therapy and CAR T cell therapy Jan 2024), protracted cytopenias, fuctional/cognitive decline, multiple recurrent infections. Also noted to have chronic left hiatal hernia, chronic elevated right hemidiaphragm, multiple unhealed pathologic fractures of the left ribs (7th - 9th), and bibasilar atelectasis.  He has been having recurrent idiopathic fevers since CAR T therapy in January 2024.  He has also had fevers not associated with active infections despite the recurrent infections he has had. He again was admitted with fevers at home and ID was consulted given unclear source once again.   Interval History:  Low-grade fever again overnight, 100.4 F.  He otherwise feels okay and seems to be at his baseline.  Assessment and Plan: * FUO (fever of unknown origin) - FUO given chronicity. Etiology certainly wide. Infection by default will always be high, but it should be noted these fevers have persisted since CAR T therapy in Jan 2024 (?cytokine release syndrome). He also does have chronic bibasilar atelectasis with elevated right hemidiaphragm and left hiatal hernia. Lastly, differential will include infectious workup as already being done (ID following also).  -Given overall negative infectious workup, still suspecting this is something immune mediated from underlying prior cancer and the treatments; might have some chronic component at this time? -From an infection standpoint, nothing acute seen on CT angio chest.  UA technically showed moderate LE, negative nitrite, 21-50 WBC (grew Pseudo in Urine in Sept 2024). No urinary symptoms and he seems improved today in general without antibiotics - low grade fever over past 24 hrs, 100.4  History  of multiple myeloma - closely followed by oncology - on chronic antimicrobial prophylaxis with dapsone and Valtrex - Recent serum IFE performed 03/15/2023 showing faint IgG lambda monoclonal protein; he is considered in remission -Follow-up repeat SPEP in process  Chronic respiratory failure with hypoxia (HCC) - Has home oxygen for intermittent use so does not appear that this is completely acute -With ongoing underlying rib fractures, chronic right hemidiaphragm, and bibasilar atelectasis he continues to be at risk for ongoing hypoxia and generalized worsening respiratory status -Continue oxygen - Continue encouraging I-S use - Needs to also ambulate  Pancytopenia (HCC) - known and chronic; followed by oncology closely -He is on weekly Nplate with average platelet count 20-30k -Monthly IVIG  Wheezing - much improved and resolved today - continue IS  Multiple rib fractures - per CTA chest: "Un-healed pathologic fractures of the left ribs 7 through 9, slightly more displaced since 08/05/2023."  History of engineered cell therapy infusion - s/p "CAR T cell therapy with [place] on 09/26/2022 to which he achieved an MRD negative very good partial response"  Mild cognitive impairment - Some suspicion for his cognitive changes being due to chronic ischemic changes in the brain and cognitive toxicities of CAR-T cell therapy  Hypothyroidism - Continue Synthroid  Diaphragmatic hernia - continue incentive spirometer   Old records reviewed in assessment of this patient  Antimicrobials:   DVT prophylaxis:  SCDs Start: 08/11/23 1455   Code Status:   Code Status: Limited: Do not attempt resuscitation (DNR) -DNR-LIMITED -Do Not Intubate/DNI   Mobility Assessment (Last 72 Hours)     Mobility Assessment     Row Name 08/14/23 0715  08/13/23 1941 08/13/23 0900 08/13/23 0742 08/12/23 2000   Does patient have an order for bedrest or is patient medically unstable No - Continue  assessment No - Continue assessment No - Continue assessment No - Continue assessment No - Continue assessment   What is the highest level of mobility based on the progressive mobility assessment? Level 4 (Walks with assist in room) - Balance while marching in place and cannot step forward and back - Complete Level 4 (Walks with assist in room) - Balance while marching in place and cannot step forward and back - Complete Level 4 (Walks with assist in room) - Balance while marching in place and cannot step forward and back - Complete Level 4 (Walks with assist in room) - Balance while marching in place and cannot step forward and back - Complete Level 4 (Walks with assist in room) - Balance while marching in place and cannot step forward and back - Complete    Row Name 08/12/23 0753 08/11/23 1915 08/11/23 1710       Does patient have an order for bedrest or is patient medically unstable No - Continue assessment No - Continue assessment No - Continue assessment     What is the highest level of mobility based on the progressive mobility assessment? Level 4 (Walks with assist in room) - Balance while marching in place and cannot step forward and back - Complete Level 4 (Walks with assist in room) - Balance while marching in place and cannot step forward and back - Complete Level 4 (Walks with assist in room) - Balance while marching in place and cannot step forward and back - Complete              Barriers to discharge: none Disposition Plan:  Home Status is: Obs  Objective: Blood pressure (!) 146/93, pulse 75, temperature 98 F (36.7 C), resp. rate 15, height 5\' 7"  (1.702 m), weight 80 kg, SpO2 (!) 85%.  Examination:  Physical Exam Constitutional:      General: He is not in acute distress.    Appearance: Normal appearance.  HENT:     Head: Normocephalic and atraumatic.     Mouth/Throat:     Mouth: Mucous membranes are moist.  Eyes:     Extraocular Movements: Extraocular movements intact.   Cardiovascular:     Rate and Rhythm: Normal rate and regular rhythm.  Pulmonary:     Effort: Pulmonary effort is normal. No respiratory distress.     Breath sounds: Normal breath sounds. No wheezing.  Abdominal:     General: Bowel sounds are normal. There is no distension.     Palpations: Abdomen is soft.     Tenderness: There is no abdominal tenderness.  Musculoskeletal:        General: Normal range of motion.     Cervical back: Normal range of motion and neck supple.  Skin:    General: Skin is warm and dry.  Neurological:     General: No focal deficit present.     Mental Status: He is alert.  Psychiatric:        Mood and Affect: Mood normal.        Behavior: Behavior normal.      Consultants:  ID Oncology  Procedures:    Data Reviewed: Results for orders placed or performed during the hospital encounter of 08/11/23 (from the past 24 hour(s))  Basic metabolic panel     Status: Abnormal   Collection Time: 08/14/23  3:43 AM  Result Value Ref Range   Sodium 135 135 - 145 mmol/L   Potassium 3.6 3.5 - 5.1 mmol/L   Chloride 102 98 - 111 mmol/L   CO2 26 22 - 32 mmol/L   Glucose, Bld 104 (H) 70 - 99 mg/dL   BUN 16 8 - 23 mg/dL   Creatinine, Ser 6.29 0.61 - 1.24 mg/dL   Calcium 8.8 (L) 8.9 - 10.3 mg/dL   GFR, Estimated >52 >84 mL/min   Anion gap 7 5 - 15  CBC with Differential/Platelet     Status: Abnormal   Collection Time: 08/14/23  3:43 AM  Result Value Ref Range   WBC 4.0 4.0 - 10.5 K/uL   RBC 2.10 (L) 4.22 - 5.81 MIL/uL   Hemoglobin 8.0 (L) 13.0 - 17.0 g/dL   HCT 13.2 (L) 44.0 - 10.2 %   MCV 113.8 (H) 80.0 - 100.0 fL   MCH 38.1 (H) 26.0 - 34.0 pg   MCHC 33.5 30.0 - 36.0 g/dL   RDW 72.5 (H) 36.6 - 44.0 %   Platelets 17 (LL) 150 - 400 K/uL   nRBC 0.5 (H) 0.0 - 0.2 %   Neutrophils Relative % 73 %   Neutro Abs 3.0 1.7 - 7.7 K/uL   Lymphocytes Relative 9 %   Lymphs Abs 0.3 (L) 0.7 - 4.0 K/uL   Monocytes Relative 16 %   Monocytes Absolute 0.7 0.1 - 1.0 K/uL    Eosinophils Relative 1 %   Eosinophils Absolute 0.0 0.0 - 0.5 K/uL   Basophils Relative 1 %   Basophils Absolute 0.0 0.0 - 0.1 K/uL   Immature Granulocytes 0 %   Abs Immature Granulocytes 0.00 0.00 - 0.07 K/uL   Schistocytes PRESENT    Ovalocytes PRESENT   Magnesium     Status: None   Collection Time: 08/14/23  3:43 AM  Result Value Ref Range   Magnesium 2.2 1.7 - 2.4 mg/dL   *Note: Due to a large number of results and/or encounters for the requested time period, some results have not been displayed. A complete set of results can be found in Results Review.    I have reviewed pertinent nursing notes, vitals, labs, and images as necessary. I have ordered labwork to follow up on as indicated.  I have reviewed the last notes from staff over past 24 hours. I have discussed patient's care plan and test results with nursing staff, CM/SW, and other staff as appropriate.    LOS: 0 days   Lewie Chamber, MD [facility] 08/14/2023, 2:05 PM

## 2023-08-14 NOTE — Progress Notes (Signed)
   08/14/23 0532  Provider Notification  Provider Name/Title Johann Capers NP  Date Provider Notified 08/14/23  Time Provider Notified 0532  Method of Notification Page (secure chat)  Notification Reason Critical Result  Date Critical Result Received 08/14/23  Time Critical Result Received 0530  Provider response Other (Comment) (NP aware)  Date of Provider Response 08/14/23  Time of Provider Response [phone]

## 2023-08-14 NOTE — Progress Notes (Signed)
I have reviewed and concur with this student's documentation. CI was present during medication pass.  Leodis Sias, RN 08/14/2023 12:02 PM

## 2023-08-14 NOTE — Progress Notes (Signed)
    [facility]    Date of Admission:  08/11/2023     ID: Andre [name] is a 72 y.o. male with   Principal Problem:   FUO (fever of unknown origin) Active Problems:   Diaphragmatic hernia   Pancytopenia (HCC)   Hypothyroidism   Mild cognitive impairment   History of engineered cell therapy infusion   Multiple rib fractures   Chronic respiratory failure with hypoxia (HCC)   History of multiple myeloma   Wheezing   Multiple myeloma in remission (HCC)    Subjective: Mild isolated fever last night of 100.21F  Denies feeling like he is febrile, no flank pain, no dysuria or frequent urination.  Medications:   cyanocobalamin  1,000 mcg Oral QAC breakfast   dapsone  100 mg Oral Daily   escitalopram  10 mg Oral Daily   ferrous sulfate  325 mg Oral BID   folic acid  1 mg Oral QAC breakfast   ipratropium-albuterol  3 mL Nebulization BID   levothyroxine  125 mcg Oral Q0600   methylphenidate  2.5 mg Oral Daily   midodrine  2.5 mg Oral BID WC    morphine injection  2 mg Intravenous Once   tamsulosin  0.4 mg Oral QHS   valACYclovir  500 mg Oral QAC breakfast    Objective: Vital signs in last 24 hours: Temp:  [98 F (36.7 C)-100.4 F (38 C)] 98 F (36.7 C) (11/26 1334) Pulse Rate:  [62-101] 75 (11/26 1334) Resp:  [15-16] 15 (11/26 1334) BP: (103-146)/(65-93) 146/93 (11/26 1334) SpO2:  [85 %-94 %] 85 % (11/26 1334)  Physical Exam  Constitutional: He is oriented to person, place, and time. He appears well-developed and well-nourished. No distress.  HENT:  Mouth/Throat: Oropharynx is clear and moist. No oropharyngeal exudate.  Cardiovascular: Normal rate, regular rhythm and normal heart sounds. Exam reveals no gallop and no friction rub.  No murmur heard.  Pulmonary/Chest: Effort normal and breath sounds normal. No respiratory distress. He has no wheezes.  Abdominal: Soft. Bowel sounds are normal. He exhibits no distension. There is no tenderness.   Lymphadenopathy:  He has no cervical adenopathy.  Neurological: He is alert and oriented to person, place, and time.  Skin: Skin is warm and dry. No rash noted. No erythema.  Psychiatric: He has a normal mood and affect. His behavior is normal.    Lab Results Recent Labs    08/13/23 0303 08/14/23 0343  WBC 3.3* 4.0  HGB 8.0* 8.0*  HCT 25.5* 23.9*  NA 137 135  K 3.5 3.6  CL 105 102  CO2 25 26  BUN 15 16  CREATININE 0.74 0.86    Sedimentation Rate Recent Labs    08/11/23 1816  ESRSEDRATE 70*   C-Reactive Protein Recent Labs    08/11/23 1816  CRP 5.7*    Microbiology: + polymicrobial ur cx  Studies/Results: No results found.   Assessment/Plan: Asymptomatic bacturia = not inclined to treating since patient doesn't have symptoms.  Intermittent fevers = appears afebrile now but no other sources found. Will continue to monitor, but will check for other less likely sources such as CMV viremia.  Multiple myeloma = Continue on prophylaxis with dapsone and valtrex  [facility] Pager: [phone]  08/14/2023, 4:17 PM

## 2023-08-14 NOTE — Plan of Care (Signed)
°  Problem: Activity: °Goal: Risk for activity intolerance will decrease °Outcome: Progressing °  °Problem: Nutrition: °Goal: Adequate nutrition will be maintained °Outcome: Progressing °  °Problem: Coping: °Goal: Level of anxiety will decrease °Outcome: Progressing °  °

## 2023-08-14 NOTE — Progress Notes (Signed)
IP PROGRESS NOTE  Subjective:   [name] is well-known to me with a history of multiple myeloma, currently in clinical remission after receiving CAR-T therapy earlier this year.  He has persistent hypogammaglobulinemia and anemia/thrombocytopenia.  He is currently maintained on weekly Nplate.  The platelet count has generally ranged between 20 and 30,000 while on Nplate since 06/20/2023.  He was recently admitted with a fever.  No source for infection was identified.  He was discharged to home 08/07/2023. He presented to the emergency room 08/11/2023 with a fever and hypoxia.  He has no complaint today. Objective: Vital signs in last 24 hours: Blood pressure 128/80, pulse 100, temperature 100 F (37.8 C), temperature source Oral, resp. rate 16, height 5\' 7"  (1.702 m), weight 176 lb 6.4 oz (80 kg), SpO2 92%.  Intake/Output from previous day: 11/25 0701 - 11/26 0700 In: 720 [P.O.:720] Out: 1600 [Urine:1600]  Physical Exam:  HEENT: No thrush or bleeding Lungs: Clear bilaterally Cardiac: Regular rate and rhythm Abdomen: Nontender, no hepatosplenomegaly Extremities: No leg edema Neurologic: Alert and oriented  Lab Results: Recent Labs    08/13/23 0303 08/14/23 0343  WBC 3.3* 4.0  HGB 8.0* 8.0*  HCT 25.5* 23.9*  PLT 19* 17*    BMET Recent Labs    08/13/23 0303 08/14/23 0343  NA 137 135  K 3.5 3.6  CL 105 102  CO2 25 26  GLUCOSE 97 104*  BUN 15 16  CREATININE 0.74 0.86  CALCIUM 9.1 8.8*    No results found for: "CEA1", "CEA", "HKV425", "CA125"  Studies/Results: No results found.  Medications: I have reviewed the patient's current medications.  Assessment/Plan:  Multiple myeloma- IgG lambda  bortezomib (subcutaneously), lenalidomide, and dexamethasone, with repeat bone marrow biopsy May of 2012 showing 10% plasmacytosis   (2) high-dose chemotherapy with BCNU and melphalan at [facility], followed by stem cell rescue July of 2012   (3) on zoledronic acid  started December of 2012, initially monthly, currently given every 3 months, most recent dose  12/07/2015   (4) low-dose lenalidomide resumed April 2013, interrupted several times.  Resumed again on 02/19/2013, ata dose of 5 mg daily, 21 days on and 7 days off, later further reduced to 7 days on, 7 days off   (5) CNS symptoms and abnormal brain MRI extensively evaluated by neurology with no definitive diagnosis established   (6) rising M spike noted June 2014 but did not meet criteria for carfilzomib study   (7) on lenalidomide 25 mg daily, 14 days on, 7 days off, starting 04/18/2013, interrupted December 2014 because of rash;             (a) resumed January 2015 at 10 mg/ day at 21 days on/ 7 days off             (b) starting 08/18/2014 decreased to 10 mg/ day 14 days on, 7 days off because of cytopenias             (c) as of February 2016 was on 5 mg daily 7 days on 7 days off             (d) lenalidomide discontinued December 2016 with evidence of disease progression   (8) transient global amnesia 05/29/2015, resolved without intervention   (9) starting PVD 10/25/2015 w ASA 325 thromboprophylaxis, valacyclovir prophylaxis, last dose 12/17/2015             (a) pomalidomide 4 mg/d days 1-14             (  b) bortezomib sQ days 2,5,9,12 of each 21 day cycle             (c) dexamethasome 20 mg two days a week             (d) dexamethasone bortezomib and pomalidomide discontinued late December 2018 with poor tolerance   (10) metapneumovirus pneumonia April 2017             (a) completing course of steroids and week of bactrim mid April 2017   (11) status post second autologous transplant at [facility] 02/04/2016(preparatory regimen melphalan 200 mg/m)             (a) received twelve-month vaccinations 03/14/2017 (DPT, Haemophilus, Pneumovax 13, polio)             (b) 14 months injections 05/04/2016 include DTaP, Hib conjugate, HepB energex B 20 mcg/ml, Prevnar 13             (c) 24 month vaccines  due at [facility] June 2019   (12) maintenance therapy started November 2017, consisting of             (a) bortezomib 1.3 mg/M2 every 14 days, first dose 07/27/2016             (b) pomalidomide 1 mg days 1-21 Q28 days, started 07/19/2016             (c) zolendronate monthly started 07/27/2016 (previously Q 3 months) however patient unable to tolerate, and changed back to q3 months in April, 2018             (d) Bortezomib changed to monthly as of June 2018 because of tolerance issues, however discontinued after 09/14/2017 dose because of a rise in his M spike             (e) pomalidomide held after 10/18/2017 in preparation for possible study at [facility] (venetoclax)--never resumed             (f) with numbers actually improved off treatment, resumed every 2-week bortezomib 12/25/2017             (g) changed to every 3-week bortezomib as of 03/17/2019             (h) briefly on weekly treatments times 22 October 2020 due to increase in M spike             (I) maintenance therapy discontinued with evidence of progression   (13) bortezomib/daratumumab/dexamethasone started 12/20/2020.             (a) Decadron dose dropped to 10 mg day of and day following treatment starting May 2022             (b) day 8 bortezomib omitted beginning with the June cycle per patient preference             (C) treatment changed to monthly daratumumab/Velcade/Decadron beginning 06/27/2021-patient decision (14) weekly Velcade/Cytoxan beginning 11/14/2021 (15) changed to Cytoxan/carfilzomib/Decadron 12/19/2021-treatment placed on hold 05/15/2022, carfilzomib 05/24/2022 and 05/31/2022 16.  Leukocyte [facility] pheres[facility] procedure [facility] 06/15/2022 17.  Treatment resumed with Cytoxan/carfilzomib/Decadron 06/28/2022, last given 09/06/2022 18.  CAR-T 09/26/2022 with Carvykti 19.  CRS and ICANS following CAR-T therapy, status post a Decadron taper completed 10/08/2022 20.  Presentation to the emergency room 10/30/2022 with failure to thrive  and a fall  21.  Pancytopenia secondary to multiple myeloma and CAR-T therapy-improved 22.  Bone marrow biopsy 11/13/2022-mildly hypocellular bone marrow with relative erythroid hyperplasia and decreased megakaryocytes; no  plasma cells identified by differential count or CD138 immunohistochemical stain; flow cytometry negative for a clonal plasma cell population.   bone marrow biopsy 05/10/2023-mildly hypercellular marrow with mild decrease in megakaryocytes, plasma cells not increased, absent iron stores, 46 XY, negative myeloma FISH panel, negative myeloid panel 23.  Cognitive impairment predating CAR-T therapy and worsened following CAR-T therapy 24.  Admission 05/05/2023 with Pseudomonas sepsis CTs chest, abdomen, and pelvis-right lung base atelectasis, no infiltrate 25.  Admission 05/20/2023 with hypoxia, altered mental status, and fever Infectious disease evaluation on hospital admission consistent with a urinary tract infection, urine culture positive for Pseudomonas aeruginosa 26.  Hypogammaglobulinemia secondary to multiple myeloma and CAR-T therapy Monthly IVIG 27.  Admission 06/14/2023 with severe anemia, hypotension, and report of a positive blood culture from 06/13/2023?  With gram-positive cocci in clusters, recent fall with a plain x-ray 06/14/2023 confirming left sixth and seventh rib fractures 28.  Thrombocytopenia-possibly bone marrow failure from extensive course of treatment, possible treatment related MDS, immune thrombocytopenia, atypical infection 06/20/2023 CMV and parvovirus negative  weekly Nplate beginning 06/20/2023 Nplate dose escalated per protocol 07/06/2023 29.  Admission 08/04/2023 with a fever 30.  Admission 08/11/2023 with a fever   [name] has a history of multiple myeloma.  He is in clinical remission.  A bone marrow biopsy in February revealed no evidence of myeloma.  A low level IgG lambda monoclonal protein has been detected.  He has been admitted with  recurrent fever.  He had a Pseudomonas UTI in September, but otherwise cultures and an infectious disease workup have been negative.  A urinalysis on 08/08/2023 is consistent with an infection and a culture has returned positive for E. coli.  I suspect the urine  is the source for fever.  H He is on Nplate for thrombocytopenia.  I doubt the Nplate is responsible for the fever, but this is possible.  I will recommend Nplate be placed on hold as his platelet count has not improved significantly on this agent.  We will repeat a myeloma panel while he is here.  We can consider a repeat bone marrow biopsy.  He is followed by Dr. Marissa Calamity at [facility] in [place] following the CAR-T therapy.    Recommendations: Transfuse platelets for a count of less than 10,000 or bleeding Hold Nplate Antibiotics for the urinary tract infection per the medical and infectious disease services Myeloma panel   LOS: 0 days   Thornton Papas, MD   08/14/2023, 7:28 AM

## 2023-08-14 NOTE — [place] FL2 (Signed)
[place] MEDICAID FL2 LEVEL OF CARE FORM     IDENTIFICATION  Patient Name: Andre [name] Birthdate: 1950-12-23 Sex: male Admission Date (Current Location): 08/11/2023  [facility] and [place] Number:  Producer, television/film/video and Address:  [facility],  [address] [place], [place] [address]      Provider Number: [id number]  Attending Physician Name and Address:  Lewie Chamber, MD  Relative Name and Phone Number:  Razi Waite, spouse @ [phone]    Current Level of Care: Hospital Recommended Level of Care: Skilled Nursing Facility Prior Approval Number:    Date Approved/Denied:   PASRR Number: [id number]  Discharge Plan: SNF    Current Diagnoses: Patient Active Problem List   Diagnosis Date Noted   Chronic respiratory failure with hypoxia (HCC) 08/12/2023   History of multiple myeloma 08/12/2023   Wheezing 08/12/2023   Multiple myeloma in remission (HCC) 08/12/2023   FUO (fever of unknown origin) 08/11/2023   SIRS (systemic inflammatory response syndrome) (HCC) 08/04/2023   Oral thrush 06/19/2023   Candidiasis of mouth 06/19/2023   Multiple rib fractures 06/16/2023   Hypotension 06/15/2023   Positive blood culture 06/15/2023   Anemia associated with multiple myeloma treated with erythropoietin (HCC) 06/14/2023   DNR (do not resuscitate)/DNI(Do Not Intubate) 05/20/2023   Severe sepsis (HCC) 05/20/2023   Acute cystitis without hematuria 05/20/2023   Moderate-to-severe aortic regurgitation 05/20/2023   Macrocytic anemia 05/20/2023   Acute hypoxemic respiratory failure (HCC) 05/05/2023   Reduced mobility 10/13/2022   Self-care deficit 10/13/2022   Physical deconditioning 10/02/2022   History of engineered cell therapy infusion 09/26/2022   Meteorism 02/09/2022   Mild cognitive impairment 08/30/2021   Hemorrhoids 11/20/2019   Neuropathy due to chemotherapeutic drug (HCC) 09/01/2019   Dry skin dermatitis 08/16/2016   Peripheral edema 04/26/2016    Bone marrow transplant status (HCC) 03/14/2016   Debility 02/26/2016   Back pain 02/26/2016   Patient in clinical research study 01/10/2016   GERD (gastroesophageal reflux disease)    Weakness 12/22/2015   Hyponatremia 12/22/2015   General weakness    Multiple myeloma not having achieved remission (HCC)    Chronic anemia    Constipation    Thrombocytopenia (HCC) 11/05/2015   Actinic keratosis 07/08/2015   Raynaud's phenomenon 09/17/2013   Cough 01/10/2013   Vitamin D deficiency 09/30/2012   Hypothyroidism 09/30/2012   Hypogonadism male 09/30/2012   Fatigue 09/27/2012   Insomnia 09/27/2012   Reactive depression (situational) 09/27/2012   Pancytopenia (HCC) 03/27/2012   Hyperlipidemia 05/30/2010   Anemia of chronic disease 05/30/2010   Asthma 05/30/2010   SCHATZKI'S RING 05/30/2010   Peptic ulcer disease 05/30/2010   Diaphragmatic hernia 05/30/2010   Sleep apnea 05/30/2010   TRANSAMINASES, SERUM, ELEVATED 05/30/2010    Orientation RESPIRATION BLADDER Height & Weight     Self, Time, Situation, Place  O2 Continent, External catheter (currently with purewick) Weight: 176 lb 6.4 oz (80 kg) Height:  5\' 7"  (170.2 cm)  BEHAVIORAL SYMPTOMS/MOOD NEUROLOGICAL BOWEL NUTRITION STATUS      Incontinent Diet  AMBULATORY STATUS COMMUNICATION OF NEEDS Skin   Limited Assist Verbally Normal                       Personal Care Assistance Level of Assistance  Bathing, Feeding, Dressing Bathing Assistance: Limited assistance Feeding assistance: Independent Dressing Assistance: Limited assistance     Functional Limitations Info  Sight, Hearing, Speech Sight Info: Impaired Hearing Info: Adequate Speech Info: Adequate  SPECIAL CARE FACTORS FREQUENCY  PT (By licensed PT), OT (By licensed OT)     PT Frequency: 5x/wk OT Frequency: 5x/wk            Contractures Contractures Info: Not present    Additional Factors Info  Code Status, Allergies, Psychotropic Code Status  Info: DNR Allergies Info: Atorvastatin, Rosuvastatin, Beef-derived Products, Chicken Protein, Pork-derived Products, Septra (Sulfamethoxazole-trimethoprim) Psychotropic Info: see MAR         Current Medications (08/14/2023):  This is the current hospital active medication list Current Facility-Administered Medications  Medication Dose Route Frequency Provider Last Rate Last Admin   acetaminophen (TYLENOL) tablet 650 mg  650 mg Oral Q6H PRN Kirby Crigler, Mir M, MD   650 mg at 08/12/23 1318   Or   acetaminophen (TYLENOL) suppository 650 mg  650 mg Rectal Q6H PRN Kirby Crigler, Mir M, MD       albuterol (PROVENTIL) (2.5 MG/3ML) 0.083% nebulizer solution 2.5 mg  2.5 mg Nebulization Q2H PRN Kirby Crigler, Mir M, MD       cyanocobalamin (VITAMIN B12) tablet 1,000 mcg  1,000 mcg Oral QAC breakfast Kirby Crigler, Mir M, MD   1,000 mcg at 08/14/23 1610   dapsone tablet 100 mg  100 mg Oral Daily Kirby Crigler, Mir M, MD   100 mg at 08/14/23 1002   escitalopram (LEXAPRO) tablet 10 mg  10 mg Oral Daily Kirby Crigler, Mir M, MD   10 mg at 08/14/23 1002   ferrous sulfate tablet 325 mg  325 mg Oral BID Kirby Crigler, Mir M, MD   325 mg at 08/14/23 1002   folic acid (FOLVITE) tablet 1 mg  1 mg Oral QAC breakfast Kirby Crigler, Mir M, MD   1 mg at 08/14/23 9604   ipratropium-albuterol (DUONEB) 0.5-2.5 (3) MG/3ML nebulizer solution 3 mL  3 mL Nebulization BID Lewie Chamber, MD   3 mL at 08/14/23 0826   levothyroxine (SYNTHROID) tablet 125 mcg  125 mcg Oral Q0600 Maryln Gottron, MD   125 mcg at 08/14/23 0536   methylphenidate (RITALIN) tablet 2.5 mg  2.5 mg Oral Daily Pricilla Riffle, RPH   2.5 mg at 08/14/23 1003   midodrine (PROAMATINE) tablet 2.5 mg  2.5 mg Oral BID WC Kirby Crigler, Mir M, MD   2.5 mg at 08/14/23 0813   morphine (PF) 2 MG/ML injection 2 mg  2 mg Intravenous Once Lewie Chamber, MD       tamsulosin [facility]) capsule 0.4 mg  0.4 mg Oral QHS Kirby Crigler, Mir M, MD   0.4 mg at 08/13/23 2146   traMADol (ULTRAM)  tablet 50 mg  50 mg Oral Q12H PRN Kirby Crigler, Mir M, MD       traZODone (DESYREL) tablet 25 mg  25 mg Oral QHS PRN Kirby Crigler, Mir M, MD       valACYclovir (VALTREX) tablet 500 mg  500 mg Oral QAC breakfast Kirby Crigler, Mir M, MD   500 mg at 08/14/23 5409     Discharge Medications: Please see discharge summary for a list of discharge medications.  Relevant Imaging Results:  Relevant Lab Results:   Additional Information SSN: [id number]  Amada Jupiter, LCSW

## 2023-08-15 ENCOUNTER — Inpatient Hospital Stay: Payer: Medicare Other

## 2023-08-15 ENCOUNTER — Inpatient Hospital Stay (HOSPITAL_COMMUNITY): Payer: Medicare Other

## 2023-08-15 DIAGNOSIS — D696 Thrombocytopenia, unspecified: Secondary | ICD-10-CM | POA: Diagnosis not present

## 2023-08-15 DIAGNOSIS — R509 Fever, unspecified: Secondary | ICD-10-CM | POA: Diagnosis not present

## 2023-08-15 DIAGNOSIS — C9000 Multiple myeloma not having achieved remission: Secondary | ICD-10-CM | POA: Diagnosis not present

## 2023-08-15 LAB — BASIC METABOLIC PANEL
Anion gap: 7 (ref 5–15)
BUN: 13 mg/dL (ref 8–23)
CO2: 28 mmol/L (ref 22–32)
Calcium: 9.3 mg/dL (ref 8.9–10.3)
Chloride: 102 mmol/L (ref 98–111)
Creatinine, Ser: 0.72 mg/dL (ref 0.61–1.24)
GFR, Estimated: >60 mL/min (ref >60)
Glucose, Bld: 94 mg/dL (ref 70–99)
Potassium: 3.5 mmol/L (ref 3.5–5.1)
Sodium: 137 mmol/L (ref 135–145)

## 2023-08-15 LAB — CBC WITH DIFFERENTIAL/PLATELET
Abs Immature Granulocytes: 0.1 10*3/uL — ABNORMAL HIGH (ref 0.00–0.07)
Band Neutrophils: 3 %
Basophils Absolute: 0 10*3/uL (ref 0.0–0.1)
Basophils Relative: 1 %
Eosinophils Absolute: 0 10*3/uL (ref 0.0–0.5)
Eosinophils Relative: 0 %
HCT: 22.9 % — ABNORMAL LOW (ref 39.0–52.0)
Hemoglobin: 7.5 g/dL — ABNORMAL LOW (ref 13.0–17.0)
Lymphocytes Relative: 1 %
Lymphs Abs: 0 10*3/uL — ABNORMAL LOW (ref 0.7–4.0)
MCH: 36.6 pg — ABNORMAL HIGH (ref 26.0–34.0)
MCHC: 32.8 g/dL (ref 30.0–36.0)
MCV: 111.7 fL — ABNORMAL HIGH (ref 80.0–100.0)
Metamyelocytes Relative: 3 %
Monocytes Absolute: 0.2 10*3/uL (ref 0.1–1.0)
Monocytes Relative: 5 %
Myelocytes: 1 %
Neutro Abs: 2.9 10*3/uL (ref 1.7–7.7)
Neutrophils Relative %: 86 %
Platelets: 17 10*3/uL — CL (ref 150–400)
RBC: 2.05 MIL/uL — ABNORMAL LOW (ref 4.22–5.81)
RDW: 15.7 % — ABNORMAL HIGH (ref 11.5–15.5)
WBC: 3.3 10*3/uL — ABNORMAL LOW (ref 4.0–10.5)
nRBC: 0 % (ref 0.0–0.2)

## 2023-08-15 LAB — URINE CULTURE

## 2023-08-15 LAB — KAPPA/LAMBDA LIGHT CHAINS
Kappa free light chain: <0.7 mg/L — ABNORMAL LOW (ref 3.3–19.4)
Kappa, lambda light chain ratio: UNDETERMINED
Lambda free light chains: <1.5 mg/L — ABNORMAL LOW (ref 5.7–26.3)

## 2023-08-15 LAB — PROTEIN ELECTROPHORESIS, SERUM
A/G Ratio: 1.2 (ref 0.7–1.7)
Albumin ELP: 3.1 g/dL (ref 2.9–4.4)
Alpha-1-Globulin: 0.3 g/dL (ref 0.0–0.4)
Alpha-2-Globulin: 0.7 g/dL (ref 0.4–1.0)
Beta Globulin: 0.9 g/dL (ref 0.7–1.3)
Gamma Globulin: 0.6 g/dL (ref 0.4–1.8)
Globulin, Total: 2.5 g/dL (ref 2.2–3.9)
Total Protein ELP: 5.6 g/dL — ABNORMAL LOW (ref 6.0–8.5)

## 2023-08-15 LAB — MAGNESIUM: Magnesium: 2 mg/dL (ref 1.7–2.4)

## 2023-08-15 MED ORDER — AMOXICILLIN-POT CLAVULANATE 875-125 MG PO TABS
1.0000 | ORAL_TABLET | Freq: Two times a day (BID) | ORAL | Status: DC
  Administered 2023-08-15 – 2023-08-17 (×4): 1 via ORAL
  Filled 2023-08-15 (×4): qty 1

## 2023-08-15 NOTE — Evaluation (Signed)
Physical Therapy Evaluation Patient Details Name: Andre [name] MRN: [id number] DOB: 04-Jan-1951 Today's Date: 08/15/2023  History of Present Illness  72 y.o. male who presented to the emergency department From [facility] SNF with fever, body aches and acute hypoxic respiratory failur. Recent admission for systemic inflammatory response syndrome. Past medical history significant of multiple myeloma not in remission, thrombocytopenia, anemia, chemotherapy-induced leukopenia, osteoarthritis, asthma, depression, diplopia, duodenal ulcer, GERD, lower GI bleed, hyperlipidemia, hypertension, hypothyroidism, transient ischemic attack, transient global amnesia  Clinical Impression  Pt admitted with above diagnosis.  Pt pleasant and agreeable to PT. Multiple recent admissions, pt dtr present for session and reporting pt seems weaker now as he has not been OOB since arriving on 08/12/23. Pt requiring assist with all functional tasks, LOB to right in sitting, standing and gait--exacerbated with fatigue  (dtr reports partially pt baseline, although currently more exaggerated) Patient will benefit from continued follow up therapy, <3 hours/day at d/c   Pt currently with functional limitations due to the deficits listed below (see PT Problem List). Pt will benefit from acute skilled PT to increase their independence and safety with mobility to allow discharge.           If plan is discharge home, recommend the following: A lot of help with walking and/or transfers;A lot of help with bathing/dressing/bathroom;Help with stairs or ramp for entrance;Assist for transportation   Can travel by private vehicle        Equipment Recommendations None recommended by PT  Recommendations for Other Services       Functional Status Assessment Patient has had a recent decline in their functional status and demonstrates the ability to make significant improvements in function in a reasonable and predictable amount  of time.     Precautions / Restrictions Precautions Precautions: Fall Precaution Comments: does not use O2 regularly, prn only Restrictions Weight Bearing Restrictions: No      Mobility  Bed Mobility               General bed mobility comments: received in chair    Transfers Overall transfer level: Needs assistance Equipment used: Rolling walker (2 wheels) Transfers: Sit to/from Stand Sit to Stand: Min assist           General transfer comment: cues for hand placement, heavy min assist to rise and transition to RW,  increased time, flexed trunk and shift to R  unless cued    Ambulation/Gait  Min to mod assist  Gait Distance (Feet): 35 Feet Assistive device: Rolling walker (2 wheels) Gait Pattern/deviations: Step-through pattern, Decreased stride length, Narrow base of support, Trunk flexed       General Gait Details: assist for balance and to maneuver RW. pt drifting and leaning to R, requiring assist and multi-modal cues  to maintain midline; slight R toe drag with incr distance. SpO2= 91-95% on RA (pt on 6L on arrival ).   Stairs            Wheelchair Mobility     Tilt Bed    Modified Rankin (Stroke Patients Only)       Balance Overall balance assessment: History of Falls, Needs assistance   Sitting balance-Leahy Scale: Poor Sitting balance - Comments: loss of balance to R when reaching, cues and assist to maintain midline   Standing balance support: Bilateral upper extremity supported, During functional activity, Reliant on assistive device for balance Standing balance-Leahy Scale: Poor Standing balance comment: R lean, daughter reports this has been typical  Pertinent Vitals/Pain Pain Assessment Pain Assessment: No/denies pain    Home Living Family/patient expects to be discharged to:: Skilled nursing facility                   Additional Comments: pt has been in and out of SNF for  rehab, 5 admissions in 6 months    Prior Function Prior Level of Function : Needs assist       Physical Assist : Mobility (physical) Mobility (physical): Gait;Transfers   Mobility Comments: Pt reported use of a RW for ambulation.  Reports he is from rehab at [facility]-- dtr confirms ADLs Comments: Pt has been self feeding and grooming, dependent in bathing, dressing and toileting at SNF.     Extremity/Trunk Assessment   Upper Extremity Assessment Upper Extremity Assessment: Defer to OT evaluation RUE Deficits / Details: 4/5    Lower Extremity Assessment Lower Extremity Assessment: Generalized weakness;RLE deficits/detail;LLE deficits/detail RLE Deficits / Details: grossly 3+/5. AROm WFL, LLE Deficits / Details: grossly 3+ to 4/5, AROM WFL    Cervical / Trunk Assessment Cervical / Trunk Assessment: Other exceptions;Kyphotic (weakness)  Communication   Communication Communication: Difficulty following commands/understanding Following commands: Follows one step commands with increased time Cueing Techniques: Verbal cues;Tactile cues;Visual cues  Cognition Arousal: Alert Behavior During Therapy: WFL for tasks assessed/performed Overall Cognitive Status: Impaired/Different from baseline Area of Impairment: Memory                     Memory: Decreased short-term memory         General Comments: pt looking to daughter to assist with PLOF        General Comments      Exercises     Assessment/Plan    PT Assessment Patient needs continued PT services  PT Problem List Decreased strength;Decreased activity tolerance;Decreased balance;Decreased mobility;Decreased knowledge of use of DME;Decreased safety awareness;Decreased cognition       PT Treatment Interventions DME instruction;Gait training;Balance training;Functional mobility training;Therapeutic activities;Therapeutic exercise;Patient/family education    PT Goals (Current goals can be found in the Care  Plan section)  Acute Rehab PT Goals Patient Stated Goal: to get strong, go back to rehab PT Goal Formulation: With patient/family Time For Goal Achievement: 08/29/23 Potential to Achieve Goals: Good    Frequency Min 1X/week     Co-evaluation               AM-PAC PT "6 Clicks" Mobility  Outcome Measure Help needed turning from your back to your side while in a flat bed without using bedrails?: A Lot Help needed moving from lying on your back to sitting on the side of a flat bed without using bedrails?: A Lot Help needed moving to and from a bed to a chair (including a wheelchair)?: A Lot   Help needed to walk in hospital room?: A Lot Help needed climbing 3-5 steps with a railing? : Total 6 Click Score: 9    End of Session Equipment Utilized During Treatment: Gait belt Activity Tolerance: Patient tolerated treatment well Patient left: in chair;with call bell/phone within reach;with chair alarm set;with family/visitor present   PT Visit Diagnosis: Other abnormalities of gait and mobility (R26.89)    Time: 1610-9604 PT Time Calculation (min) (ACUTE ONLY): 30 min   Charges:   PT Evaluation $PT Eval Low Complexity: 1 Low PT Treatments $Gait Training: 8-22 mins PT General Charges $$ ACUTE PT VISIT: 1 Visit         Delice Bison, PT  Acute Rehab Dept [facility]) [phone]  08/15/2023   [facility] 08/15/2023, 12:48 PM

## 2023-08-15 NOTE — Progress Notes (Signed)
IP PROGRESS NOTE  Subjective:   [name] denies bleeding.  He reports leg cramping.  No other complaint.   Objective: Vital signs in last 24 hours: Blood pressure (!) 110/54, pulse 84, temperature 97.9 F (36.6 C), resp. rate 18, height 5\' 7"  (1.702 m), weight 176 lb 6.4 oz (80 kg), SpO2 98%.  Intake/Output from previous day: 11/26 0701 - 11/27 0700 In: 870 [P.O.:870] Out: 2000 [Urine:2000]  Physical Exam:  HEENT: No thrush few petechiae at the buccal mucosa Lungs: Decreased breath sounds with end inspiratory rhonchi at the posterior base bilaterally, no respiratory distress Cardiac: Regular rate and rhythm Abdomen: Nontender, no hepatosplenomegaly Extremities: No leg edema Neurologic: Alert and oriented  Lab Results: Recent Labs    08/14/23 0343 08/15/23 0409  WBC 4.0 3.3*  HGB 8.0* 7.5*  HCT 23.9* 22.9*  PLT 17* 17*    BMET Recent Labs    08/14/23 0343 08/15/23 0409  NA 135 137  K 3.6 3.5  CL 102 102  CO2 26 28  GLUCOSE 104* 94  BUN 16 13  CREATININE 0.86 0.72  CALCIUM 8.8* 9.3    No results found for: "CEA1", "CEA", "ZOX096", "CA125"  Studies/Results: No results found.  Medications: I have reviewed the patient's current medications.  Assessment/Plan:  Multiple myeloma- IgG lambda  bortezomib (subcutaneously), lenalidomide, and dexamethasone, with repeat bone marrow biopsy May of 2012 showing 10% plasmacytosis   (2) high-dose chemotherapy with BCNU and melphalan at [facility], followed by stem cell rescue July of 2012   (3) on zoledronic acid started December of 2012, initially monthly, currently given every 3 months, most recent dose  12/07/2015   (4) low-dose lenalidomide resumed April 2013, interrupted several times.  Resumed again on 02/19/2013, ata dose of 5 mg daily, 21 days on and 7 days off, later further reduced to 7 days on, 7 days off   (5) CNS symptoms and abnormal brain MRI extensively evaluated by neurology with no definitive diagnosis  established   (6) rising M spike noted June 2014 but did not meet criteria for carfilzomib study   (7) on lenalidomide 25 mg daily, 14 days on, 7 days off, starting 04/18/2013, interrupted December 2014 because of rash;             (a) resumed January 2015 at 10 mg/ day at 21 days on/ 7 days off             (b) starting 08/18/2014 decreased to 10 mg/ day 14 days on, 7 days off because of cytopenias             (c) as of February 2016 was on 5 mg daily 7 days on 7 days off             (d) lenalidomide discontinued December 2016 with evidence of disease progression   (8) transient global amnesia 05/29/2015, resolved without intervention   (9) starting PVD 10/25/2015 w ASA 325 thromboprophylaxis, valacyclovir prophylaxis, last dose 12/17/2015             (a) pomalidomide 4 mg/d days 1-14             (b) bortezomib sQ days 2,5,9,12 of each 21 day cycle             (c) dexamethasome 20 mg two days a week             (d) dexamethasone bortezomib and pomalidomide discontinued late December 2018 with poor tolerance   (10) metapneumovirus pneumonia April  2017             (a) completing course of steroids and week of bactrim mid April 2017   (11) status post second autologous transplant at [facility] 02/04/2016(preparatory regimen melphalan 200 mg/m)             (a) received twelve-month vaccinations 03/14/2017 (DPT, Haemophilus, Pneumovax 13, polio)             (b) 14 months injections 05/04/2016 include DTaP, Hib conjugate, HepB energex B 20 mcg/ml, Prevnar 13             (c) 24 month vaccines due at [facility] June 2019   (12) maintenance therapy started November 2017, consisting of             (a) bortezomib 1.3 mg/M2 every 14 days, first dose 07/27/2016             (b) pomalidomide 1 mg days 1-21 Q28 days, started 07/19/2016             (c) zolendronate monthly started 07/27/2016 (previously Q 3 months) however patient unable to tolerate, and changed back to q3 months in April, 2018             (d)  Bortezomib changed to monthly as of June 2018 because of tolerance issues, however discontinued after 09/14/2017 dose because of a rise in his M spike             (e) pomalidomide held after 10/18/2017 in preparation for possible study at [facility] (venetoclax)--never resumed             (f) with numbers actually improved off treatment, resumed every 2-week bortezomib 12/25/2017             (g) changed to every 3-week bortezomib as of 03/17/2019             (h) briefly on weekly treatments times 22 October 2020 due to increase in M spike             (I) maintenance therapy discontinued with evidence of progression   (13) bortezomib/daratumumab/dexamethasone started 12/20/2020.             (a) Decadron dose dropped to 10 mg day of and day following treatment starting May 2022             (b) day 8 bortezomib omitted beginning with the June cycle per patient preference             (C) treatment changed to monthly daratumumab/Velcade/Decadron beginning 06/27/2021-patient decision (14) weekly Velcade/Cytoxan beginning 11/14/2021 (15) changed to Cytoxan/carfilzomib/Decadron 12/19/2021-treatment placed on hold 05/15/2022, carfilzomib 05/24/2022 and 05/31/2022 16.  Leukocyte a pheresis procedure at [facility] 06/15/2022 17.  Treatment resumed with Cytoxan/carfilzomib/Decadron 06/28/2022, last given 09/06/2022 18.  CAR-T 09/26/2022 with Carvykti 19.  CRS and ICANS following CAR-T therapy, status post a Decadron taper completed 10/08/2022 20.  Presentation to the emergency room 10/30/2022 with failure to thrive and a fall  21.  Pancytopenia secondary to multiple myeloma and CAR-T therapy-improved 22.  Bone marrow biopsy 11/13/2022-mildly hypocellular bone marrow with relative erythroid hyperplasia and decreased megakaryocytes; no plasma cells identified by differential count or CD138 immunohistochemical stain; flow cytometry negative for a clonal plasma cell population.   bone marrow biopsy 05/10/2023-mildly  hypercellular marrow with mild decrease in megakaryocytes, plasma cells not increased, absent iron stores, 46 XY, negative myeloma FISH panel, negative myeloid panel 23.  Cognitive impairment predating CAR-T therapy and worsened following CAR-T therapy  24.  Admission 05/05/2023 with Pseudomonas sepsis CTs chest, abdomen, and pelvis-right lung base atelectasis, no infiltrate 25.  Admission 05/20/2023 with hypoxia, altered mental status, and fever Infectious disease evaluation on hospital admission consistent with a urinary tract infection, urine culture positive for Pseudomonas aeruginosa 26.  Hypogammaglobulinemia secondary to multiple myeloma and CAR-T therapy Monthly IVIG 27.  Admission 06/14/2023 with severe anemia, hypotension, and report of a positive blood culture from 06/13/2023?  With gram-positive cocci in clusters, recent fall with a plain x-ray 06/14/2023 confirming left sixth and seventh rib fractures 28.  Thrombocytopenia-possibly bone marrow failure from extensive course of treatment, possible treatment related MDS, immune thrombocytopenia, atypical infection 06/20/2023 CMV and parvovirus negative  weekly Nplate beginning 06/20/2023 Nplate dose escalated per protocol 07/06/2023 29.  Admission 08/04/2023 with a fever 30.  Admission 08/11/2023 with a fever   Andre [name] has a history of multiple myeloma.  He is in clinical remission.  A bone marrow biopsy in February revealed no evidence of myeloma.  A low level IgG lambda monoclonal protein has been detected.  He has been admitted with recurrent fever.  He had a Pseudomonas UTI in September, but otherwise cultures and an infectious disease workup have been negative.  A urinalysis on 08/08/2023 is consistent with an infection and a culture revealed multiple bacteria species.  Infectious disease has not recommend biotics.  He is on Nplate for thrombocytopenia.  I doubt the Nplate is responsible for the fever, but this is possible.  I will the  Nplate on hold since the platelet count has not improved.  A repeat myeloma panel is pending..  We can consider a repeat bone marrow biopsy.  He is followed by Dr. Marissa Calamity at [facility] in [place] following the CAR-T therapy.  I have placed a call to Dr. Dorothea Ogle to discuss the case.  The etiology of the hypoxia is unclear.  This is likely secondary to atelectasis.  He has intermittent fever over the past several months.  I think he can be evaluated by the medical staff at [facility] for fever and not directed to the emergency room with each occurrence.  I reviewed the case with his wife by telephone.  I recommended she or the [facility] staff contact our office if he develops a fever and we can direct evaluation.  We will arrange for a follow-up visit next week.  Recommendations: Transfuse platelets for a count of less than 10,000 or bleeding Hold Nplate Follow-up myeloma panel Please call Oncology as needed.  Outpatient follow-up will be scheduled at the Cancer center next week.   LOS: 1 day   Thornton Papas, MD   08/15/2023, 1:41 PM

## 2023-08-15 NOTE — Evaluation (Addendum)
Clinical/Bedside Swallow Evaluation Patient Details  Name: Andre [name] MRN: [id number] Date of Birth: [birth year]  Today's Date: 08/15/2023 Time: SLP Start Time (ACUTE ONLY): 1422 SLP Stop Time (ACUTE ONLY): 1444 SLP Time Calculation (min) (ACUTE ONLY): 22 min  Past Medical History:  Past Medical History:  Diagnosis Date   Anemia    Arthritis    Asthma    no treatment x 20 years   Depression    Double vision    occurs at times    Duodenal ulcer    GERD (gastroesophageal reflux disease)    Hemorrhage of rectum and anus 09/15/2013   Hyperlipidemia    Hypertension    Hypothyroidism    Leukopenia due to antineoplastic chemotherapy (HCC) 02/22/2017   Multiple myeloma 07/04/2011   Thyroid disease    TIA (transient ischemic attack) 03/27/2012   Transient diplopia 03/27/2012   Transient global amnesia 06/07/2015   Past Surgical History:  Past Surgical History:  Procedure Laterality Date   BONE MARROW TRANSPLANT  2011   for MM   CARDIOLITE STUDY  11/25/2003   NORMAL   TONSILLECTOMY     HPI:  72 y.o. male who presented to the emergency department From [facility] with fever, body aches and acute hypoxic respiratory failur. Recent admission for systemic inflammatory response syndrome. Past medical history significant of multiple myeloma not in remission, thrombocytopenia, anemia, chemotherapy-induced leukopenia, osteoarthritis, asthma, depression, diplopia, duodenal ulcer, GERD, lower GI bleed, hyperlipidemia, hypertension, hypothyroidism, transient ischemic attack, transient global amnesia. Chest CT Negative for acute pulmonary embolus, low lung volumes with confluent atelectasis, in part due to chronic hiatal hernia on the left. No pleural effusion or convincing pneumonia, multiple myeloma. MBS 05/08/23 trace vallecular residue, transient penetration (PAS 2), pill retained in esophagus that cleared with puree. Recommend Dys 3/ thin liquids.    Assessment / Plan / Recommendation   Clinical Impression  Pt seen for swallow assessment with daughter at bedside. She reported he has been eating/drinking fairly well but was in the hospital a few weeks ago and got "strangled" when RN brushed his teeth. He has intact dentition and volitional cough is strong. Reviewed with pt and daughter MBS that was performed 04/2023 which revealed one episode of transient penetration and hesitancy of pill in esophagus that cleared with puree. He seemed to occasionally hold water in mouth several seconds before transferring. There was one mild cough after a larger straw sip that did not occur with smaller sips for the remainder of the assessment. He had been upright in chair since early this morning and leaning to his right slightly and tended to hold head in flexed position. Reviewed factors that may put him at higher aspiration risk and advised proper positioning, small sips and remain upright following meals. Recommend he continue regular diet, thin liquids and meds with thin (one at a time). Pt and daughter in agreement and no further follow up needed. If pt is readmitted and has a pna, an objective test may be warranted.  SLP Visit Diagnosis: Dysphagia, unspecified (R13.10)    Aspiration Risk  Mild aspiration risk    Diet Recommendation Regular;Thin liquid    Liquid Administration via: Straw;Cup Medication Administration: Whole meds with liquid Supervision: Patient able to self feed Compensations: Slow rate;Small sips/bites Postural Changes: Remain upright for at least 30 minutes after po intake;Seated upright at 90 degrees    Other  Recommendations Oral Care Recommendations: Oral care BID    Recommendations for follow up therapy are one component of  a multi-disciplinary discharge planning process, led by the attending physician.  Recommendations may be updated based on patient status, additional functional criteria and insurance authorization.  Follow up Recommendations No SLP follow up       Assistance Recommended at Discharge    Functional Status Assessment Patient has not had a recent decline in their functional status  Frequency and Duration            Prognosis        Swallow Study   General Date of Onset: 08/15/23 HPI: 72 y.o. male who presented to the emergency department From [facility] with fever, body aches and acute hypoxic respiratory failur. Recent admission for systemic inflammatory response syndrome. Past medical history significant of multiple myeloma not in remission, thrombocytopenia, anemia, chemotherapy-induced leukopenia, osteoarthritis, asthma, depression, diplopia, duodenal ulcer, GERD, lower GI bleed, hyperlipidemia, hypertension, hypothyroidism, transient ischemic attack, transient global amnesia. Chest CT Negative for acute pulmonary embolus, low lung volumes with confluent atelectasis, in part due to chronic hiatal hernia on the left. No pleural effusion or convincing pneumonia, multiple myeloma. MBS 05/08/23 trace vallecular residue, transient penetration (PAS 2), pill retained in esophagus that cleared with puree. Recommend Dys 3/ thin liquids. Type of Study: Bedside Swallow Evaluation Previous Swallow Assessment:  (see HPI) Diet Prior to this Study: Regular;Thin liquids (Level 0) Temperature Spikes Noted: No Respiratory Status: Room air History of Recent Intubation: No Behavior/Cognition: Alert;Cooperative;Pleasant mood Oral Cavity Assessment: Within Functional Limits Oral Care Completed by SLP: No Oral Cavity - Dentition: Adequate natural dentition Vision: Functional for self-feeding Self-Feeding Abilities: Able to feed self Patient Positioning: Upright in chair Baseline Vocal Quality: Normal Volitional Cough: Strong Volitional Swallow: Able to elicit    Oral/Motor/Sensory Function Overall Oral Motor/Sensory Function: Within functional limits   Ice Chips Ice chips: Not tested   Thin Liquid Thin Liquid: Impaired Presentation:  Straw Pharyngeal  Phase Impairments: Cough - Immediate (x 1 with larger sip)    Nectar Thick Nectar Thick Liquid: Not tested   Honey Thick Honey Thick Liquid: Not tested   Puree Puree: Within functional limits   Solid     Solid: Within functional limits      Royce Macadamia 08/15/2023,3:11 PM

## 2023-08-15 NOTE — Progress Notes (Signed)
PROGRESS NOTE    Andre [name]  ZDG:[id number] DOB: [birth year] DOA: 08/11/2023 PCP: [name], MD   Brief Narrative: 72 year old with past medical history significant of relapsed/refractory IgG lambda multiple myeloma status post eighth line therapy and CAR T-cell therapy January 2024) protracted cytopenias, functional cognitive decline, multiple recurrent infections, chronic left hiatal hernia, chronic elevated right hemidiaphragm, multiple unhealed pathologic fractures of the ribs bibasilar atelectasis who has had recurrent idiopathic fever since CAR T therapy in January 2024.  He again has been admitted with fevers, ID following.  Fever of unknown etiology.   Assessment & Plan:   Principal Problem:   FUO (fever of unknown origin) Active Problems:   Pancytopenia (HCC)   Chronic respiratory failure with hypoxia (HCC)   History of multiple myeloma   Wheezing   Diaphragmatic hernia   Hypothyroidism   Mild cognitive impairment   History of engineered cell therapy infusion   Multiple rib fractures   Multiple myeloma in remission (HCC)  1-Fever of unknown origin: -These fevers have persisted since CAR T therapy in Jan 2024 (?cytokine release syndrome).  -Infectious workup: CT angio chest; negative for acute PE, low volumes with confluent atelectasis, UA 21-50 white blood cell  -Blood culture: No growth to date -Urine culture: More than 100,000 colony E. coli 40,000 colonies Citrobacter -COVID PCR negative -Daughter does report patient some times cough after drinking water or eating. Repeated chest x ray raise concern for Aspiration. Discussed with ID plan to start Augmentin for 5 days.  Evaluated by speech continue with current diet. Needs to be seating up for meals.   History of multiple myeloma -Clinically in remission bone marrow biopsy in February revealed no evidence of myeloma Appreciate Dr Truett Perna assistance.   Chronic respiratory failure with hypoxia: -stable on  2 L. Required overnight 6 L, repeated chest x ray stable.  Monitor, back down on 2 L.   Pancytopenia -He is on weekly Nplate with average platelet count 20-30k -Monthly IVIG Plan to hold NPlate due to fever.   Respiratory system wheezing: resolved.  Multiple ribs fractures -incentive spirometry    History of engineered cell therapy infusion - s/p "CAR T cell therapy with [place] on 09/26/2022 to which he achieved an MRD negative very good partial response"    Mild cognitive impairment; support care.    Hypothyroidism Continue with Synthroid.   Diaphragmatic hernia - continue incentive spirometer  PPI    Estimated body mass index is 27.63 kg/m as calculated from the following:   Height as of this encounter: 5\' 7"  (1.702 m).   Weight as of this encounter: 80 kg.   DVT prophylaxis: SCD Code Status: DNR Family Communication: Daughter at bedside Disposition Plan:  Status is: Inpatient Remains inpatient appropriate because: SNF tomorrow    Consultants:  Dr Truett Perna  Procedures:  none  Antimicrobials:    Subjective: He was seen this am, sitting recliner, he was alert, denies pain.  This afternoon, wife with concern he was very weak and his head slump down when he was transfer from recliner to bed. Per Nurse T, no loss of consciousness.   Objective: Vitals:   08/14/23 1656 08/14/23 2001 08/14/23 2147 08/15/23 0513  BP: 137/70  137/72 126/66  Pulse:   85 73  Resp:   18 18  Temp:   98.4 F (36.9 C) 98.1 F (36.7 C)  TempSrc:   Oral Oral  SpO2:  (!) 88% 95% 95%  Weight:      Height:  Intake/Output Summary (Last 24 hours) at 08/15/2023 0700 Last data filed at 08/15/2023 2956 Gross per 24 hour  Intake 870 ml  Output 2000 ml  Net -1130 ml   Filed Weights   08/11/23 1059  Weight: 80 kg    Examination:  General exam: Appears calm and comfortable  Respiratory system: Clear to auscultation. Respiratory effort normal. Cardiovascular system:  S1 & S2 heard, RRR.  Gastrointestinal system: Abdomen is nondistended, soft and nontender. No organomegaly or masses felt. Normal bowel sounds heard. Central nervous system: Alert Extremities: no edema   Data Reviewed: I have personally reviewed following labs and imaging studies  CBC: Recent Labs  Lab 08/10/23 1023 08/11/23 1118 08/12/23 0352 08/13/23 0303 08/14/23 0343 08/15/23 0409  WBC 4.2 4.6 3.7* 3.3* 4.0 3.3*  NEUTROABS 2.9 3.2  --  2.1 3.0 2.9  HGB 9.6* 8.8* 8.0* 8.0* 8.0* 7.5*  HCT 29.0* 26.6* 24.3* 25.5* 23.9* 22.9*  MCV 114.2* 114.2* 113.6* 115.9* 113.8* 111.7*  PLT 23* 21* 17* 19* 17* 17*   Basic Metabolic Panel: Recent Labs  Lab 08/11/23 1118 08/12/23 0352 08/13/23 0303 08/14/23 0343 08/15/23 0409  NA 134* 134* 137 135 137  K 3.6 3.5 3.5 3.6 3.5  CL 100 102 105 102 102  CO2 25 26 25 26 28   GLUCOSE 100* 97 97 104* 94  BUN 12 13 15 16 13   CREATININE 0.87 0.73 0.74 0.86 0.72  CALCIUM 8.9 8.8* 9.1 8.8* 9.3  MG  --   --  2.2 2.2 2.0   GFR: Estimated Creatinine Clearance: 82.2 mL/min (by C-G formula based on SCr of 0.72 mg/dL). Liver Function Tests: Recent Labs  Lab 08/11/23 1118  AST 19  ALT 10  ALKPHOS 64  BILITOT 0.5  PROT 6.2*  ALBUMIN 3.3*   No results for input(s): "LIPASE", "AMYLASE" in the last 168 hours. No results for input(s): "AMMONIA" in the last 168 hours. Coagulation Profile: No results for input(s): "INR", "PROTIME" in the last 168 hours. Cardiac Enzymes: No results for input(s): "CKTOTAL", "CKMB", "CKMBINDEX", "TROPONINI" in the last 168 hours. BNP (last 3 results) No results for input(s): "PROBNP" in the last 8760 hours. HbA1C: No results for input(s): "HGBA1C" in the last 72 hours. CBG: No results for input(s): "GLUCAP" in the last 168 hours. Lipid Profile: No results for input(s): "CHOL", "HDL", "LDLCALC", "TRIG", "CHOLHDL", "LDLDIRECT" in the last 72 hours. Thyroid Function Tests: No results for input(s): "TSH",  "T4TOTAL", "FREET4", "T3FREE", "THYROIDAB" in the last 72 hours. Anemia Panel: No results for input(s): "VITAMINB12", "FOLATE", "FERRITIN", "TIBC", "IRON", "RETICCTPCT" in the last 72 hours. Sepsis Labs: Recent Labs  Lab 08/12/23 0352  PROCALCITON <0.10    Recent Results (from the past 240 hour(s))  Respiratory (~20 pathogens) panel by PCR     Status: None   Collection Time: 08/05/23  8:51 AM   Specimen: Nasopharyngeal Swab; Respiratory  Result Value Ref Range Status   Adenovirus NOT DETECTED NOT DETECTED Final   Coronavirus 229E NOT DETECTED NOT DETECTED Final    Comment: (NOTE) The Coronavirus on the Respiratory Panel, DOES NOT test for the novel  Coronavirus (2019 nCoV)    Coronavirus HKU1 NOT DETECTED NOT DETECTED Final   Coronavirus NL63 NOT DETECTED NOT DETECTED Final   Coronavirus OC43 NOT DETECTED NOT DETECTED Final   Metapneumovirus NOT DETECTED NOT DETECTED Final   Rhinovirus / Enterovirus NOT DETECTED NOT DETECTED Final   Influenza A NOT DETECTED NOT DETECTED Final   Influenza B NOT DETECTED NOT DETECTED  Final   Parainfluenza Virus 1 NOT DETECTED NOT DETECTED Final   Parainfluenza Virus 2 NOT DETECTED NOT DETECTED Final   Parainfluenza Virus 3 NOT DETECTED NOT DETECTED Final   Parainfluenza Virus 4 NOT DETECTED NOT DETECTED Final   Respiratory Syncytial Virus NOT DETECTED NOT DETECTED Final   Bordetella pertussis NOT DETECTED NOT DETECTED Final   Bordetella Parapertussis NOT DETECTED NOT DETECTED Final   Chlamydophila pneumoniae NOT DETECTED NOT DETECTED Final   Mycoplasma pneumoniae NOT DETECTED NOT DETECTED Final    Comment: Performed at [facility], [address], [place], [place] [address]  SARS Coronavirus 2 by RT PCR (hospital order, performed in [facility] hospital lab) *cepheid single result test* Anterior Nasal Swab     Status: None   Collection Time: 08/11/23 11:18 AM   Specimen: Anterior Nasal Swab  Result Value Ref Range Status   SARS  Coronavirus 2 by RT PCR NEGATIVE NEGATIVE Final    Comment: (NOTE) SARS-CoV-2 target nucleic acids are NOT DETECTED.  The SARS-CoV-2 RNA is generally detectable in upper and lower respiratory specimens during the acute phase of infection. The lowest concentration of SARS-CoV-2 viral copies this assay can detect is 250 copies / mL. A negative result does not preclude SARS-CoV-2 infection and should not be used as the sole basis for treatment or other patient management decisions.  A negative result may occur with improper specimen collection / handling, submission of specimen other than nasopharyngeal swab, presence of viral mutation(s) within the areas targeted by this assay, and inadequate number of viral copies (<250 copies / mL). A negative result must be combined with clinical observations, patient history, and epidemiological information.  Fact Sheet for Patients:   RoadLapTop.co.za  Fact Sheet for Healthcare Providers: http://kim-miller.com/  This test is not yet approved or  cleared by the [place] FDA and has been authorized for detection and/or diagnosis of SARS-CoV-2 by FDA under an Emergency Use Authorization (EUA).  This EUA will remain in effect (meaning this test can be used) for the duration of the COVID-19 declaration under Section 564(b)(1) of the Act, 21 U.S.C. section 360bbb-3(b)(1), unless the authorization is terminated or revoked sooner.  Performed at [facility], [address] [place], [place] [address]   Urine Culture     Status: Abnormal (Preliminary result)   Collection Time: 08/11/23  1:29 PM   Specimen: Urine, Random  Result Value Ref Range Status   Specimen Description   Final    URINE, RANDOM Performed at [facility], [address], [place], [place] [address]    Special Requests   Final    NONE Reflexed from [id number] Performed at [facility], [address] [place], [place] [address]    Culture (A)  Final    >=100,000 COLONIES/mL ESCHERICHIA COLI 40,000 COLONIES/mL CITROBACTER FREUNDII 20,000 COLONIES/mL ENTEROCOCCUS FAECALIS SUSCEPTIBILITIES TO FOLLOW Performed at [facility], [address], [place], [place] [address]    Report Status PENDING  Incomplete  Culture, blood (Routine X 2) w Reflex to ID Panel     Status: None (Preliminary result)   Collection Time: 08/11/23  6:16 PM   Specimen: BLOOD RIGHT HAND  Result Value Ref Range Status   Specimen Description   Final    BLOOD RIGHT HAND Performed at [facility], [address] [place], [place] [address]    Special Requests   Final    BOTTLES DRAWN AEROBIC ONLY Blood  Culture adequate volume Performed at [facility], [address], [place], [place] [address]    Culture   Final    NO GROWTH 2 DAYS Performed at [facility], [address] [place], [place] [address]    Report Status PENDING  Incomplete  Culture, blood (Routine X 2) w Reflex to ID Panel     Status: None (Preliminary result)   Collection Time: 08/11/23  6:16 PM   Specimen: BLOOD LEFT HAND  Result Value Ref Range Status   Specimen Description   Final    BLOOD LEFT HAND Performed at [facility], [address], [place], [place] [address]    Special Requests   Final    BOTTLES DRAWN AEROBIC ONLY Blood Culture adequate volume Performed at [facility], [address], [place], [place] [address]    Culture   Final    NO GROWTH 2 DAYS Performed at [facility], [address], [place], [place] [address]    Report Status PENDING  Incomplete         Radiology Studies: No results found.      Scheduled Meds:  cyanocobalamin  1,000 mcg Oral QAC breakfast   dapsone  100 mg Oral Daily   escitalopram  10 mg Oral Daily   ferrous sulfate  325 mg Oral BID   folic acid  1 mg Oral QAC breakfast    ipratropium-albuterol  3 mL Nebulization BID   levothyroxine  125 mcg Oral Q0600   methylphenidate  2.5 mg Oral Daily   midodrine  2.5 mg Oral BID WC    morphine injection  2 mg Intravenous Once   pantoprazole  40 mg Oral Daily   tamsulosin  0.4 mg Oral QHS   valACYclovir  500 mg Oral QAC breakfast   Continuous Infusions:   LOS: 1 day    Time spent: 35 minutes.     Alba Cory, MD [facility]   If 7PM-7AM, please contact night-coverage [email or web address]  08/15/2023, 7:00 AM

## 2023-08-15 NOTE — Progress Notes (Signed)
[facility]    Date of Admission:  08/11/2023      ID: Andre [name] is a 72 y.o. male  with PMH of relapsed/refractory IgG lambda MM (s/p 8th line therapy and CAR T cell therapy Jan 2024), protracted cytopenias, fuctional/cognitive decline admitted for intermittent fevers. No overt source found Principal Problem:   FUO (fever of unknown origin) Active Problems:   Diaphragmatic hernia   Pancytopenia (HCC)   Hypothyroidism   Mild cognitive impairment   History of engineered cell therapy infusion   Multiple rib fractures   Chronic respiratory failure with hypoxia (HCC)   History of multiple myeloma   Wheezing   Multiple myeloma in remission (HCC)    Subjective: Afebrile. About to be evaluated by PT to see if can ambulate independently. No cough, no dysuria.  Medications:   cyanocobalamin  1,000 mcg Oral QAC breakfast   dapsone  100 mg Oral Daily   escitalopram  10 mg Oral Daily   ferrous sulfate  325 mg Oral BID   folic acid  1 mg Oral QAC breakfast   ipratropium-albuterol  3 mL Nebulization BID   levothyroxine  125 mcg Oral Q0600   methylphenidate  2.5 mg Oral Daily   midodrine  2.5 mg Oral BID WC    morphine injection  2 mg Intravenous Once   pantoprazole  40 mg Oral Daily   tamsulosin  0.4 mg Oral QHS   valACYclovir  500 mg Oral QAC breakfast    Objective: Vital signs in last 24 hours: Temp:  [97.9 F (36.6 C)-98.4 F (36.9 C)] 97.9 F (36.6 C) (11/27 0958) Pulse Rate:  [73-85] 84 (11/27 0958) Resp:  [15-18] 18 (11/27 0958) BP: (110-146)/(54-93) 110/54 (11/27 0958) SpO2:  [85 %-98 %] 98 % (11/27 0958) Physical Exam  Constitutional: He is oriented to person, place, and time. He appears well-developed and well-nourished. No distress.  HENT:  Mouth/Throat: Oropharynx is clear and moist. No oropharyngeal exudate.  Cardiovascular: Normal rate, regular rhythm and normal heart sounds. Exam reveals no gallop and no friction rub.  No  murmur heard.  Pulmonary/Chest: Effort normal and breath sounds normal. No respiratory distress. He has no wheezes.  Neurological: He is alert and oriented to person, place, and time.  Skin: Skin is warm and dry. No rash noted. No erythema.  Psychiatric: He has a normal mood and affect. His behavior is normal.    Lab Results Recent Labs    08/14/23 0343 08/15/23 0409  WBC 4.0 3.3*  HGB 8.0* 7.5*  HCT 23.9* 22.9*  NA 135 137  K 3.6 3.5  CL 102 102  CO2 26 28  BUN 16 13  CREATININE 0.86 0.72   Lab Results  Component Value Date   ESRSEDRATE 70 (H) 08/11/2023    Microbiology: reviewed Studies/Results: No results found.   Assessment/Plan: Intermittent fevers in immunocompromised host = will check CMV viral load (negative in October), also will check fungitell - as amarker for invasive fungal disease, imaging of chest and brain by CT did not show any overt concerns for infection. If work up does not yield positive, results consider that this maybe due to underlying multiple myeloma.  Thrombocytopenia = ranging from 17-22 during this admission. Close to his baseline. Has received Nplate. Continue to monitor for signs of bleeding. Does not have petechaie on exam.  Deconditioning = to be evaluated by PT to see if needs SNF or can still continue at home  [place]  [facility] Pager: [phone]  08/15/2023, 11:19 AM

## 2023-08-15 NOTE — Evaluation (Signed)
Occupational Therapy Evaluation Patient Details Name: Andre [name] MRN: [id number] DOB: May 10, 1951 Today's Date: 08/15/2023   History of Present Illness 72 y.o. male who presented to the emergency department From [facility] with fever, body aches and acute hypoxic respiratory failur. Recent admission for systemic inflammatory response syndrome. Past medical history significant of multiple myeloma not in remission, thrombocytopenia, anemia, chemotherapy-induced leukopenia, osteoarthritis, asthma, depression, diplopia, duodenal ulcer, GERD, lower GI bleed, hyperlipidemia, hypertension, hypothyroidism, transient ischemic attack, transient global amnesia   Clinical Impression   Pt has walked with a RW and needed assistance for bathing and dressing for some time. He is typically able to self feed and groom with set up. Pt looks toward his daughter who is bedside for assistance answering questions about his recent history. Pt is pleasant and cooperative. He is a former long distance runner and enjoys exercise. Pt presents with generalized weakness, decreased activity tolerance and poor sitting and standing balance. He requires set up to moderate assistance for ADLs. Pt with tendency to lean R in sitting and standing. Patient will benefit from continued inpatient follow up therapy, <3 hours/day.       If plan is discharge home, recommend the following: A little help with walking and/or transfers;A lot of help with bathing/dressing/bathroom;Assistance with cooking/housework;Direct supervision/assist for medications management;Direct supervision/assist for financial management;Assist for transportation;Help with stairs or ramp for entrance    Functional Status Assessment  Patient has had a recent decline in their functional status and demonstrates the ability to make significant improvements in function in a reasonable and predictable amount of time.  Equipment Recommendations  Other (comment)  (defer)    Recommendations for Other Services       Precautions / Restrictions Precautions Precautions: Fall Precaution Comments: does not use O2 regularly, prn only Restrictions Weight Bearing Restrictions: No      Mobility Bed Mobility               General bed mobility comments: received in chair    Transfers Overall transfer level: Needs assistance Equipment used: Rolling walker (2 wheels) Transfers: Sit to/from Stand Sit to Stand: Contact guard assist           General transfer comment: close guard, increased time, flexed posture unless cued      Balance Overall balance assessment: History of Falls, Needs assistance   Sitting balance-Leahy Scale: Poor Sitting balance - Comments: loss of balance to R when reaching for foot to don sock   Standing balance support: Bilateral upper extremity supported, During functional activity, Reliant on assistive device for balance Standing balance-Leahy Scale: Poor Standing balance comment: R lean, daughter reports this has been typical                           ADL either performed or assessed with clinical judgement   ADL Overall ADL's : Needs assistance/impaired Eating/Feeding: Set up;Sitting   Grooming: Set up;Sitting   Upper Body Bathing: Moderate assistance;Sitting   Lower Body Bathing: Moderate assistance;Sit to/from stand   Upper Body Dressing : Minimal assistance;Sitting   Lower Body Dressing: Moderate assistance;Sit to/from stand   Toilet Transfer: Minimal assistance;Ambulation;Rolling walker (2 wheels)           Functional mobility during ADLs: Minimal assistance;Rolling walker (2 wheels)       Vision Baseline Vision/History: 1 Wears glasses Ability to See in Adequate Light: 0 Adequate Patient Visual Report: No change from baseline  Perception         Praxis         Pertinent Vitals/Pain Pain Assessment Pain Assessment: Faces Faces Pain Scale: No hurt      Extremity/Trunk Assessment Upper Extremity Assessment Upper Extremity Assessment: Right hand dominant;RUE deficits/detail RUE Deficits / Details: 4/5   Lower Extremity Assessment Lower Extremity Assessment: Defer to PT evaluation   Cervical / Trunk Assessment Cervical / Trunk Assessment: Other exceptions;Kyphotic (weakness)   Communication     Cognition Arousal: Alert Behavior During Therapy: WFL for tasks assessed/performed Overall Cognitive Status: Impaired/Different from baseline Area of Impairment: Memory                     Memory: Decreased short-term memory         General Comments: pt looking to daughter to assist with PLOF     General Comments       Exercises     Shoulder Instructions      Home Living Family/patient expects to be discharged to:: Skilled nursing facility                                 Additional Comments: pt has been in and out of SNF for rehab, 5 admissions in 6 months      Prior Functioning/Environment Prior Level of Function : Needs assist             Mobility Comments: Pt reported use of a RW for ambulation.  Reports he is from rehab at [facility]-- dtr confirms ADLs Comments: Pt has been self feeding and grooming, dependent in bathing, dressing and toileting at SNF.        OT Problem List: Decreased strength;Decreased activity tolerance;Impaired balance (sitting and/or standing);Decreased cognition;Decreased safety awareness;Decreased knowledge of use of DME or AE      OT Treatment/Interventions: Self-care/ADL training;DME and/or AE instruction;Therapeutic activities;Cognitive remediation/compensation;Balance training;Patient/family education    OT Goals(Current goals can be found in the care plan section) Acute Rehab OT Goals OT Goal Formulation: With patient/family Time For Goal Achievement: 08/29/23 Potential to Achieve Goals: Good ADL Goals Pt Will Perform Grooming: with contact guard  assist;standing Pt Will Perform Upper Body Bathing: with min assist;sitting Pt Will Perform Upper Body Dressing: with supervision;sitting Pt Will Transfer to Toilet: with contact guard assist;ambulating;bedside commode Pt Will Perform Toileting - Clothing Manipulation and hygiene: with min assist;sit to/from stand Additional ADL Goal #1: Pt will participate in ADLs in unsupported sitting without LOB.  OT Frequency: Min 1X/week    Co-evaluation              AM-PAC OT "6 Clicks" Daily Activity     Outcome Measure Help from another person eating meals?: A Little Help from another person taking care of personal grooming?: A Little Help from another person toileting, which includes using toliet, bedpan, or urinal?: A Lot Help from another person bathing (including washing, rinsing, drying)?: A Lot Help from another person to put on and taking off regular upper body clothing?: A Lot Help from another person to put on and taking off regular lower body clothing?: A Lot 6 Click Score: 14   End of Session Equipment Utilized During Treatment: Gait belt;Rolling walker (2 wheels);Oxygen Nurse Communication: Mobility status  Activity Tolerance: Patient tolerated treatment well Patient left: in chair;with call bell/phone within reach;with chair alarm set;with family/visitor present  OT Visit Diagnosis: Unsteadiness on feet (R26.81);Other abnormalities of gait and  mobility (R26.89);Muscle weakness (generalized) (M62.81);Other symptoms and signs involving cognitive function;Other (comment) (decreased activity tolerance)                Time: 8119-1478 OT Time Calculation (min): 20 min Charges:  OT General Charges $OT Visit: 1 Visit OT Evaluation $OT Eval Moderate Complexity: 1 Mod Berna Spare, OTR/L Acute Rehabilitation Services Office: [phone]  Evern Bio 08/15/2023, 11:46 AM

## 2023-08-15 NOTE — TOC Progression Note (Signed)
Transition of Care [facility]) - Progression Note    Patient Details  Name: Andre [name] MRN: [id number] Date of Birth: Jan 11, 1951  Transition of [facility]) CM/SW Contact  Amada Jupiter, LCSW Phone Number: 08/15/2023, 3:40 PM  Clinical Narrative:     Pt not yet medically cleared for dc back to [facility].  Have updated pt/family as well as [facility].    Expected Discharge Plan: Skilled Nursing Facility Barriers to Discharge: Continued Medical Work up  Expected Discharge Plan and Services In-house Referral: Clinical Social Work   Post Acute Care Choice: Skilled Nursing Facility Living arrangements for the past 2 months: Skilled Nursing Facility (at [facility] since 05/25/23)                                       Social Determinants of Health (SDOH) Interventions SDOH Screenings   Food Insecurity: No Food Insecurity (08/11/2023)  Housing: Low Risk  (08/11/2023)  Transportation Needs: No Transportation Needs (08/11/2023)  Utilities: Not At Risk (08/11/2023)  Depression (PHQ2-9): Low Risk  (06/08/2022)  Tobacco Use: Medium Risk (08/11/2023)    Readmission Risk Interventions    06/20/2023   12:59 PM  Readmission Risk Prevention Plan  Transportation Screening Complete  PCP or Specialist Appt within 3-5 Days Complete  HRI or Home Care Consult Complete  Social Work Consult for Recovery Care Planning/Counseling Complete  Palliative Care Screening Not Applicable  Medication Review Oceanographer) Complete

## 2023-08-15 NOTE — Plan of Care (Signed)
  Problem: Activity: Goal: Risk for activity intolerance will decrease Outcome: Progressing   Problem: Pain Management: Goal: General experience of comfort will improve Outcome: Progressing   Problem: Safety: Goal: Ability to remain free from injury will improve Outcome: Progressing   Problem: Pain Management: Goal: General experience of comfort will improve Outcome: Progressing

## 2023-08-16 DIAGNOSIS — R509 Fever, unspecified: Secondary | ICD-10-CM | POA: Diagnosis not present

## 2023-08-16 LAB — CBC WITH DIFFERENTIAL/PLATELET
Abs Immature Granulocytes: 0.16 10*3/uL — ABNORMAL HIGH (ref 0.00–0.07)
Basophils Absolute: 0 10*3/uL (ref 0.0–0.1)
Basophils Relative: 1 %
Eosinophils Absolute: 0.1 10*3/uL (ref 0.0–0.5)
Eosinophils Relative: 2 %
HCT: 22.6 % — ABNORMAL LOW (ref 39.0–52.0)
Hemoglobin: 7.3 g/dL — ABNORMAL LOW (ref 13.0–17.0)
Immature Granulocytes: 5 %
Lymphocytes Relative: 7 %
Lymphs Abs: 0.2 10*3/uL — ABNORMAL LOW (ref 0.7–4.0)
MCH: 36.1 pg — ABNORMAL HIGH (ref 26.0–34.0)
MCHC: 32.3 g/dL (ref 30.0–36.0)
MCV: 111.9 fL — ABNORMAL HIGH (ref 80.0–100.0)
Monocytes Absolute: 0.4 10*3/uL (ref 0.1–1.0)
Monocytes Relative: 11 %
Neutro Abs: 2.5 10*3/uL (ref 1.7–7.7)
Neutrophils Relative %: 74 %
Platelets: 16 10*3/uL — CL (ref 150–400)
RBC: 2.02 MIL/uL — ABNORMAL LOW (ref 4.22–5.81)
RDW: 16.3 % — ABNORMAL HIGH (ref 11.5–15.5)
WBC: 3.3 10*3/uL — ABNORMAL LOW (ref 4.0–10.5)
nRBC: 0 % (ref 0.0–0.2)

## 2023-08-16 LAB — CMV DNA, QUANTITATIVE, PCR
CMV DNA Quant: NEGATIVE [IU]/mL
Log10 CMV Qn DNA Pl: UNDETERMINED {Log}

## 2023-08-16 LAB — BASIC METABOLIC PANEL
Anion gap: 7 (ref 5–15)
BUN: 15 mg/dL (ref 8–23)
CO2: 27 mmol/L (ref 22–32)
Calcium: 8.8 mg/dL — ABNORMAL LOW (ref 8.9–10.3)
Chloride: 98 mmol/L (ref 98–111)
Creatinine, Ser: 0.82 mg/dL (ref 0.61–1.24)
GFR, Estimated: >60 mL/min (ref >60)
Glucose, Bld: 94 mg/dL (ref 70–99)
Potassium: 3.3 mmol/L — ABNORMAL LOW (ref 3.5–5.1)
Sodium: 132 mmol/L — ABNORMAL LOW (ref 135–145)

## 2023-08-16 LAB — MAGNESIUM: Magnesium: 2.2 mg/dL (ref 1.7–2.4)

## 2023-08-16 MED ORDER — POTASSIUM CHLORIDE CRYS ER 20 MEQ PO TBCR
40.0000 meq | EXTENDED_RELEASE_TABLET | Freq: Once | ORAL | Status: AC
  Administered 2023-08-16: 40 meq via ORAL
  Filled 2023-08-16: qty 2

## 2023-08-16 NOTE — Progress Notes (Addendum)
IP PROGRESS NOTE  Subjective:   Andre [name] denies bleeding.  No complaint.       Objective: Vital signs in last 24 hours: Blood pressure 113/60, pulse 73, temperature 98.8 F (37.1 C), temperature source Oral, resp. rate 16, height 5\' 7"  (1.702 m), weight 176 lb 6.4 oz (80 kg), SpO2 90%.  Intake/Output from previous day: 11/27 0701 - 11/28 0700 In: 240 [P.O.:240] Out: 2000 [Urine:2000]  Physical Exam:  HEENT: No thrush or bleeding Lungs: Decreased breath sounds with end inspiratory rhonchi at the posterior base bilaterally, no respiratory distress Cardiac: Regular rate and rhythm Abdomen: Nontender, no hepatosplenomegaly Extremities: No leg edema Neurologic: Alert and oriented  Lab Results: Recent Labs    08/15/23 0409 08/16/23 0402  WBC 3.3* 3.3*  HGB 7.5* 7.3*  HCT 22.9* 22.6*  PLT 17* 16*    BMET Recent Labs    08/15/23 0409 08/16/23 0402  NA 137 132*  K 3.5 3.3*  CL 102 98  CO2 28 27  GLUCOSE 94 94  BUN 13 15  CREATININE 0.72 0.82  CALCIUM 9.3 8.8*    No results found for: "CEA1", "CEA", "CAN199", "CA125"  Studies/Results: DG CHEST PORT 1 VIEW  Result Date: 08/15/2023 CLINICAL DATA:  Hypoxia EXAM: PORTABLE CHEST 1 VIEW COMPARISON:  Chest radiograph dated 08/11/2023 FINDINGS: Unchanged asymmetric elevation of the right hemidiaphragm. Normal lung volumes. Left basilar patchy and dense retrocardiac opacities. No pleural effusion or pneumothorax. The heart size and mediastinal contours are within normal limits. Similar appearance of multiple bilateral rib fractures. IMPRESSION: Left basilar patchy and dense retrocardiac opacities, which may represent combination of atelectasis and known hiatal hernia. Aspiration or pneumonia can be considered in the appropriate clinical setting. Electronically Signed   By: Agustin Cree M.D.   On: 08/15/2023 14:16    Medications: I have reviewed the patient's current medications.  Assessment/Plan:  Multiple myeloma- IgG  lambda  bortezomib (subcutaneously), lenalidomide, and dexamethasone, with repeat bone marrow biopsy May of 2012 showing 10% plasmacytosis   (2) high-dose chemotherapy with BCNU and melphalan at [facility], followed by stem cell rescue July of 2012   (3) on zoledronic acid started December of 2012, initially monthly, currently given every 3 months, most recent dose  12/07/2015   (4) low-dose lenalidomide resumed April 2013, interrupted several times.  Resumed again on 02/19/2013, ata dose of 5 mg daily, 21 days on and 7 days off, later further reduced to 7 days on, 7 days off   (5) CNS symptoms and abnormal brain MRI extensively evaluated by neurology with no definitive diagnosis established   (6) rising M spike noted June 2014 but did not meet criteria for carfilzomib study   (7) on lenalidomide 25 mg daily, 14 days on, 7 days off, starting 04/18/2013, interrupted December 2014 because of rash;             (a) resumed January 2015 at 10 mg/ day at 21 days on/ 7 days off             (b) starting 08/18/2014 decreased to 10 mg/ day 14 days on, 7 days off because of cytopenias             (c) as of February 2016 was on 5 mg daily 7 days on 7 days off             (d) lenalidomide discontinued December 2016 with evidence of disease progression   (8) transient global amnesia 05/29/2015, resolved without intervention   (9)  starting PVD 10/25/2015 w ASA 325 thromboprophylaxis, valacyclovir prophylaxis, last dose 12/17/2015             (a) pomalidomide 4 mg/d days 1-14             (b) bortezomib sQ days 2,5,9,12 of each 21 day cycle             (c) dexamethasome 20 mg two days a week             (d) dexamethasone bortezomib and pomalidomide discontinued late December 2018 with poor tolerance   (10) metapneumovirus pneumonia April 2017             (a) completing course of steroids and week of bactrim mid April 2017   (11) status post second autologous transplant at [facility] 02/04/2016(preparatory regimen  melphalan 200 mg/m)             (a) received twelve-month vaccinations 03/14/2017 (DPT, Haemophilus, Pneumovax 13, polio)             (b) 14 months injections 05/04/2016 include DTaP, Hib conjugate, HepB energex B 20 mcg/ml, Prevnar 13             (c) 24 month vaccines due at [facility] June 2019   (12) maintenance therapy started November 2017, consisting of             (a) bortezomib 1.3 mg/M2 every 14 days, first dose 07/27/2016             (b) pomalidomide 1 mg days 1-21 Q28 days, started 07/19/2016             (c) zolendronate monthly started 07/27/2016 (previously Q 3 months) however patient unable to tolerate, and changed back to q3 months in April, 2018             (d) Bortezomib changed to monthly as of June 2018 because of tolerance issues, however discontinued after 09/14/2017 dose because of a rise in his M spike             (e) pomalidomide held after 10/18/2017 in preparation for possible study at [facility] (venetoclax)--never resumed             (f) with numbers actually improved off treatment, resumed every 2-week bortezomib 12/25/2017             (g) changed to every 3-week bortezomib as of 03/17/2019             (h) briefly on weekly treatments times 22 October 2020 due to increase in M spike             (I) maintenance therapy discontinued with evidence of progression   (13) bortezomib/daratumumab/dexamethasone started 12/20/2020.             (a) Decadron dose dropped to 10 mg day of and day following treatment starting May 2022             (b) day 8 bortezomib omitted beginning with the June cycle per patient preference             (C) treatment changed to monthly daratumumab/Velcade/Decadron beginning 06/27/2021-patient decision (14) weekly Velcade/Cytoxan beginning 11/14/2021 (15) changed to Cytoxan/carfilzomib/Decadron 12/19/2021-treatment placed on hold 05/15/2022, carfilzomib 05/24/2022 and 05/31/2022 16.  Leukocyte a pheresis procedure at [facility] 06/15/2022 17.  Treatment  resumed with Cytoxan/carfilzomib/Decadron 06/28/2022, last given 09/06/2022 18.  CAR-T 09/26/2022 with Carvykti 19.  CRS and ICANS following CAR-T therapy, status post a Decadron taper completed 10/08/2022 20.  Presentation to the emergency room 10/30/2022 with failure to thrive and a fall  21.  Pancytopenia secondary to multiple myeloma and CAR-T therapy-improved 22.  Bone marrow biopsy 11/13/2022-mildly hypocellular bone marrow with relative erythroid hyperplasia and decreased megakaryocytes; no plasma cells identified by differential count or CD138 immunohistochemical stain; flow cytometry negative for a clonal plasma cell population.   bone marrow biopsy 05/10/2023-mildly hypercellular marrow with mild decrease in megakaryocytes, plasma cells not increased, absent iron stores, 46 XY, negative myeloma FISH panel, negative myeloid panel 23.  Cognitive impairment predating CAR-T therapy and worsened following CAR-T therapy 24.  Admission 05/05/2023 with Pseudomonas sepsis CTs chest, abdomen, and pelvis-right lung base atelectasis, no infiltrate 25.  Admission 05/20/2023 with hypoxia, altered mental status, and fever Infectious disease evaluation on hospital admission consistent with a urinary tract infection, urine culture positive for Pseudomonas aeruginosa 26.  Hypogammaglobulinemia secondary to multiple myeloma and CAR-T therapy Monthly IVIG 27.  Admission 06/14/2023 with severe anemia, hypotension, and report of a positive blood culture from 06/13/2023?  With gram-positive cocci in clusters, recent fall with a plain x-ray 06/14/2023 confirming left sixth and seventh rib fractures 28.  Thrombocytopenia-possibly bone marrow failure from extensive course of treatment, possible treatment related MDS, immune thrombocytopenia, atypical infection 06/20/2023 CMV and parvovirus negative  weekly Nplate beginning 06/20/2023 Nplate dose escalated per protocol 07/06/2023 29.  Admission 08/04/2023 with a fever 30.   Admission 08/11/2023 with a fever   Andre [name] has a history of multiple myeloma.  He is in clinical remission.  A bone marrow biopsy in August revealed no evidence of myeloma.  A myeloma panel 08/11/2023 revealed no serum M spike.  He has persistent hypogammaglobulinemia.  He has been admitted with recurrent fever.  He had a Pseudomonas UTI in September, but otherwise cultures and an infectious disease workup have been negative.  A urinalysis on 08/08/2023 was consistent with an infection and a culture revealed multiple bacteria species.  He is now on Augmentin for suspected aspiration.  He has been on Nplate for thrombocytopenia.  I doubt the Nplate is responsible for the fever, but this is possible.  I will the Nplate on hold since the platelet count has not improved.  I discussed the case with Dr. Dorothea Ogle from the myeloma team at [facility] yesterday.  He agrees with discontinuing Nplate.  He suspects the pancytopenia is related to treatment induced MDS.  The plan is to continue supportive care.  I have a low suspicion for progression of the myeloma given the negative bone marrow biopsy in August.  The etiology of the hypoxia is unclear.  This is likely secondary to atelectasis.  He has intermittent fever over the past several months.  I think he can be evaluated by the medical staff at [facility] for fever and not directed to the emergency room with each occurrence.  I reviewed the case with his wife by telephone.  I recommended she or the [facility] staff contact our office if he develops a fever and we can direct evaluation.  He will [facility]-up at the Cancer center for a lab visit next week and an office visit in 2 weeks.  Recommendations: Transfuse platelets for a count of less than 10,000 or bleeding Hold Nplate Transfuse red cells for symptomatic anemia, we can check his CBC in the office next week and transfuse as indicated Continue evaluation and management of the hypoxia per the medical  service Follow-up as scheduled at the Cancer center    LOS: 2 days  Thornton Papas, MD   08/16/2023, 7:50 AM

## 2023-08-16 NOTE — Progress Notes (Addendum)
PROGRESS NOTE    Andre [name]  VHQ:[id number] DOB: Mar 14, 1951 DOA: 08/11/2023 PCP: [name], MD   Brief Narrative: 72 year old with past medical history significant of relapsed/refractory IgG lambda multiple myeloma status post eighth line therapy and CAR T-cell therapy January 2024) protracted cytopenias, functional cognitive decline, multiple recurrent infections, chronic left hiatal hernia, chronic elevated right hemidiaphragm, multiple unhealed pathologic fractures of the ribs bibasilar atelectasis who has had recurrent idiopathic fever since CAR T therapy in January 2024.  He again has been admitted with fevers, ID following.  Fever of unknown etiology.   Assessment & Plan:   Principal Problem:   FUO (fever of unknown origin) Active Problems:   Pancytopenia (HCC)   Chronic respiratory failure with hypoxia (HCC)   History of multiple myeloma   Wheezing   Diaphragmatic hernia   Hypothyroidism   Mild cognitive impairment   History of engineered cell therapy infusion   Multiple rib fractures   Multiple myeloma in remission (HCC)  1-Fever of unknown origin: -These fevers have persisted since CAR T therapy in Jan 2024 (?cytokine release syndrome).  -Infectious workup: CT angio chest; negative for acute PE, low volumes with confluent atelectasis, UA 21-50 white blood cell  -Blood culture: No growth to date -Urine culture: More than 100,000 colony E. coli 40,000 colonies Citrobacter -COVID PCR negative -Daughter does report patient some times cough after drinking water or eating. Repeated chest x ray raise concern for Aspiration. Discussed with ID plan to start Augmentin for 5 days.  Evaluated by speech continue with current diet. Needs to be seating up for meals.  He has remain afebrile. He had an episode of shivering this afternoon. Tempeture was normal 98. Would avoid to check rectal tempeture.   History of multiple myeloma -Clinically in remission bone marrow biopsy  in February revealed no evidence of myeloma Appreciate Dr Truett Perna assistance.   Chronic respiratory failure with hypoxia: -stable on 2 L. Required overnight 6 L, repeated chest x ray stable.  Monitor, back down on 2 L.   Pancytopenia -He is on weekly Nplate with average platelet count 20-30k -Monthly IVIG Plan to hold NPlate due to fever.   Respiratory system wheezing: resolved.  Multiple ribs fractures -incentive spirometry    History of engineered cell therapy infusion - s/p "CAR T cell therapy with [facility] on 09/26/2022 to which he achieved an MRD negative very good partial response"    Mild cognitive impairment; support care.    Hypothyroidism Continue with Synthroid.   Diaphragmatic hernia - continue incentive spirometer  PPI   Hypokalemia; replete orally.   Estimated body mass index is 27.63 kg/m as calculated from the following:   Height as of this encounter: 5\' 7"  (1.702 m).   Weight as of this encounter: 80 kg.   DVT prophylaxis: SCD Code Status: DNR Family Communication: Daughters at bedside Disposition Plan:  Status is: Inpatient Remains inpatient appropriate because: SNF tomorrow    Consultants:  Dr Truett Perna  Procedures:  none  Antimicrobials:    Subjective: He just started to have shivering. Oxygen was normal on 2 L when recheck.    Objective: Vitals:   08/16/23 0209 08/16/23 0619 08/16/23 0628 08/16/23 1200  BP: (!) 120/59  113/60   Pulse: 81  73   Resp: 15  16   Temp: 98 F (36.7 C)  98.8 F (37.1 C) 98 F (36.7 C)  TempSrc:   Oral Oral  SpO2: 90% 93% 90% 90%  Weight:  Height:        Intake/Output Summary (Last 24 hours) at 08/16/2023 1247 Last data filed at 08/16/2023 1030 Gross per 24 hour  Intake 120 ml  Output 2050 ml  Net -1930 ml   Filed Weights   08/11/23 1059  Weight: 80 kg    Examination:  General exam: shivering.  Respiratory system: CTA Cardiovascular system: S 1, S 2 RRR Gastrointestinal  system: BS present, soft, nt Central nervous system: alert Extremities: no edema   Data Reviewed: I have personally reviewed following labs and imaging studies  CBC: Recent Labs  Lab 08/11/23 1118 08/12/23 0352 08/13/23 0303 08/14/23 0343 08/15/23 0409 08/16/23 0402  WBC 4.6 3.7* 3.3* 4.0 3.3* 3.3*  NEUTROABS 3.2  --  2.1 3.0 2.9 2.5  HGB 8.8* 8.0* 8.0* 8.0* 7.5* 7.3*  HCT 26.6* 24.3* 25.5* 23.9* 22.9* 22.6*  MCV 114.2* 113.6* 115.9* 113.8* 111.7* 111.9*  PLT 21* 17* 19* 17* 17* 16*   Basic Metabolic Panel: Recent Labs  Lab 08/12/23 0352 08/13/23 0303 08/14/23 0343 08/15/23 0409 08/16/23 0402  NA 134* 137 135 137 132*  K 3.5 3.5 3.6 3.5 3.3*  CL 102 105 102 102 98  CO2 26 25 26 28 27   GLUCOSE 97 97 104* 94 94  BUN 13 15 16 13 15   CREATININE 0.73 0.74 0.86 0.72 0.82  CALCIUM 8.8* 9.1 8.8* 9.3 8.8*  MG  --  2.2 2.2 2.0 2.2   GFR: Estimated Creatinine Clearance: 80.2 mL/min (by C-G formula based on SCr of 0.82 mg/dL). Liver Function Tests: Recent Labs  Lab 08/11/23 1118  AST 19  ALT 10  ALKPHOS 64  BILITOT 0.5  PROT 6.2*  ALBUMIN 3.3*   No results for input(s): "LIPASE", "AMYLASE" in the last 168 hours. No results for input(s): "AMMONIA" in the last 168 hours. Coagulation Profile: No results for input(s): "INR", "PROTIME" in the last 168 hours. Cardiac Enzymes: No results for input(s): "CKTOTAL", "CKMB", "CKMBINDEX", "TROPONINI" in the last 168 hours. BNP (last 3 results) No results for input(s): "PROBNP" in the last 8760 hours. HbA1C: No results for input(s): "HGBA1C" in the last 72 hours. CBG: No results for input(s): "GLUCAP" in the last 168 hours. Lipid Profile: No results for input(s): "CHOL", "HDL", "LDLCALC", "TRIG", "CHOLHDL", "LDLDIRECT" in the last 72 hours. Thyroid Function Tests: No results for input(s): "TSH", "T4TOTAL", "FREET4", "T3FREE", "THYROIDAB" in the last 72 hours. Anemia Panel: No results for input(s): "VITAMINB12", "FOLATE",  "FERRITIN", "TIBC", "IRON", "RETICCTPCT" in the last 72 hours. Sepsis Labs: Recent Labs  Lab 08/12/23 0352  PROCALCITON <0.10    Recent Results (from the past 240 hour(s))  SARS Coronavirus 2 by RT PCR (hospital order, performed in [facility] hospital lab) *cepheid single result test* Anterior Nasal Swab     Status: None   Collection Time: 08/11/23 11:18 AM   Specimen: Anterior Nasal Swab  Result Value Ref Range Status   SARS Coronavirus 2 by RT PCR NEGATIVE NEGATIVE Final    Comment: (NOTE) SARS-CoV-2 target nucleic acids are NOT DETECTED.  The SARS-CoV-2 RNA is generally detectable in upper and lower respiratory specimens during the acute phase of infection. The lowest concentration of SARS-CoV-2 viral copies this assay can detect is 250 copies / mL. A negative result does not preclude SARS-CoV-2 infection and should not be used as the sole basis for treatment or other patient management decisions.  A negative result may occur with improper specimen collection / handling, submission of specimen other than nasopharyngeal swab,  presence of viral mutation(s) within the areas targeted by this assay, and inadequate number of viral copies (<250 copies / mL). A negative result must be combined with clinical observations, patient history, and epidemiological information.  Fact Sheet for Patients:   RoadLapTop.co.za  Fact Sheet for Healthcare Providers: http://kim-miller.com/  This test is not yet approved or  cleared by the [place] FDA and has been authorized for detection and/or diagnosis of SARS-CoV-2 by FDA under an Emergency Use Authorization (EUA).  This EUA will remain in effect (meaning this test can be used) for the duration of the COVID-19 declaration under Section 564(b)(1) of the Act, 21 U.S.C. section 360bbb-3(b)(1), unless the authorization is terminated or revoked sooner.  Performed at [facility], [address] [place], [place] [address]   Urine Culture     Status: Abnormal   Collection Time: 08/11/23  1:29 PM   Specimen: Urine, Random  Result Value Ref Range Status   Specimen Description   Final    URINE, RANDOM Performed at [facility], [address] [place], [place] [address]    Special Requests   Final    NONE Reflexed from [id number] Performed at [facility], [address], [place], [place] [address]    Culture (A)  Final    >=100,000 COLONIES/mL ESCHERICHIA COLI 40,000 COLONIES/mL CITROBACTER FREUNDII 20,000 COLONIES/mL ENTEROCOCCUS FAECALIS    Report Status 08/15/2023 FINAL  Final   Organism ID, Bacteria ESCHERICHIA COLI (A)  Final   Organism ID, Bacteria CITROBACTER FREUNDII (A)  Final   Organism ID, Bacteria ENTEROCOCCUS FAECALIS (A)  Final      Susceptibility   Citrobacter freundii - MIC*    CEFEPIME 2 SENSITIVE Sensitive     CEFTRIAXONE >=64 RESISTANT Resistant     CIPROFLOXACIN <=0.25 SENSITIVE Sensitive     GENTAMICIN <=1 SENSITIVE Sensitive     IMIPENEM <=0.25 SENSITIVE Sensitive     NITROFURANTOIN <=16 SENSITIVE Sensitive     TRIMETH/SULFA <=20 SENSITIVE Sensitive     PIP/TAZO >=128 RESISTANT Resistant ug/mL    * 40,000 COLONIES/mL CITROBACTER FREUNDII   Escherichia coli - MIC*    AMPICILLIN 8 SENSITIVE Sensitive     CEFAZOLIN <=4 SENSITIVE Sensitive     CEFEPIME <=0.12 SENSITIVE Sensitive     CEFTRIAXONE <=0.25 SENSITIVE Sensitive     CIPROFLOXACIN <=0.25 SENSITIVE Sensitive     GENTAMICIN <=1 SENSITIVE Sensitive     IMIPENEM <=0.25 SENSITIVE Sensitive     NITROFURANTOIN <=16 SENSITIVE Sensitive     TRIMETH/SULFA <=20 SENSITIVE Sensitive     AMPICILLIN/SULBACTAM <=2 SENSITIVE Sensitive     PIP/TAZO <=4 SENSITIVE Sensitive ug/mL    * >=100,000 COLONIES/mL ESCHERICHIA COLI   Enterococcus faecalis - MIC*    AMPICILLIN <=2 SENSITIVE Sensitive     NITROFURANTOIN <=16 SENSITIVE Sensitive      VANCOMYCIN 1 SENSITIVE Sensitive     * 20,000 COLONIES/mL ENTEROCOCCUS FAECALIS  Culture, blood (Routine X 2) w Reflex to ID Panel     Status: None (Preliminary result)   Collection Time: 08/11/23  6:16 PM   Specimen: BLOOD RIGHT HAND  Result Value Ref Range Status   Specimen Description   Final    BLOOD RIGHT HAND Performed at [facility], [address] [place], [place] [address]    Special Requests   Final    BOTTLES DRAWN AEROBIC ONLY Blood Culture adequate volume Performed at [facility], [address] [place], [place]  [address]    Culture   Final    NO GROWTH 4 DAYS Performed at [facility], [address], [place], [place] [address]    Report Status PENDING  Incomplete  Culture, blood (Routine X 2) w Reflex to ID Panel     Status: None (Preliminary result)   Collection Time: 08/11/23  6:16 PM   Specimen: BLOOD LEFT HAND  Result Value Ref Range Status   Specimen Description   Final    BLOOD LEFT HAND Performed at [facility], [address], [place], [place] [address]    Special Requests   Final    BOTTLES DRAWN AEROBIC ONLY Blood Culture adequate volume Performed at [facility], [address], [place], [place] [address]    Culture   Final    NO GROWTH 4 DAYS Performed at [facility], [address] [place], [place] [address]    Report Status PENDING  Incomplete         Radiology Studies: DG CHEST PORT 1 VIEW  Result Date: 08/15/2023 CLINICAL DATA:  Hypoxia EXAM: PORTABLE CHEST 1 VIEW COMPARISON:  Chest radiograph dated 08/11/2023 FINDINGS: Unchanged asymmetric elevation of the right hemidiaphragm. Normal lung volumes. Left basilar patchy and dense retrocardiac opacities. No pleural effusion or pneumothorax. The heart size and mediastinal contours are within normal limits. Similar appearance of multiple bilateral rib fractures. IMPRESSION: Left basilar patchy and dense retrocardiac opacities,  which may represent combination of atelectasis and known hiatal hernia. Aspiration or pneumonia can be considered in the appropriate clinical setting. Electronically Signed   By: Agustin Cree M.D.   On: 08/15/2023 14:16        Scheduled Meds:  amoxicillin-clavulanate  1 tablet Oral Q12H   cyanocobalamin  1,000 mcg Oral QAC breakfast   dapsone  100 mg Oral Daily   escitalopram  10 mg Oral Daily   ferrous sulfate  325 mg Oral BID   folic acid  1 mg Oral QAC breakfast   ipratropium-albuterol  3 mL Nebulization BID   levothyroxine  125 mcg Oral Q0600   methylphenidate  2.5 mg Oral Daily   midodrine  2.5 mg Oral BID WC    morphine injection  2 mg Intravenous Once   pantoprazole  40 mg Oral Daily   tamsulosin  0.4 mg Oral QHS   valACYclovir  500 mg Oral QAC breakfast   Continuous Infusions:   LOS: 2 days    Time spent: 35 minutes.     Alba Cory, MD [facility]   If 7PM-7AM, please contact night-coverage [email or web address]  08/16/2023, 12:47 PM

## 2023-08-16 NOTE — Progress Notes (Signed)
[name] platelets today are at 16. They were 17 on 11/27. Messaged Anthoney Harada.

## 2023-08-17 ENCOUNTER — Other Ambulatory Visit (HOSPITAL_BASED_OUTPATIENT_CLINIC_OR_DEPARTMENT_OTHER): Payer: Self-pay

## 2023-08-17 DIAGNOSIS — R509 Fever, unspecified: Secondary | ICD-10-CM | POA: Diagnosis not present

## 2023-08-17 LAB — CULTURE, BLOOD (ROUTINE X 2)
Culture: NO GROWTH
Culture: NO GROWTH
Special Requests: ADEQUATE
Special Requests: ADEQUATE

## 2023-08-17 LAB — BASIC METABOLIC PANEL
Anion gap: 7 (ref 5–15)
BUN: 14 mg/dL (ref 8–23)
CO2: 26 mmol/L (ref 22–32)
Calcium: 8.7 mg/dL — ABNORMAL LOW (ref 8.9–10.3)
Chloride: 104 mmol/L (ref 98–111)
Creatinine, Ser: 0.81 mg/dL (ref 0.61–1.24)
GFR, Estimated: >60 mL/min (ref >60)
Glucose, Bld: 103 mg/dL — ABNORMAL HIGH (ref 70–99)
Potassium: 3.6 mmol/L (ref 3.5–5.1)
Sodium: 137 mmol/L (ref 135–145)

## 2023-08-17 LAB — CBC WITH DIFFERENTIAL/PLATELET
Abs Immature Granulocytes: 0.12 10*3/uL — ABNORMAL HIGH (ref 0.00–0.07)
Basophils Absolute: 0 10*3/uL (ref 0.0–0.1)
Basophils Relative: 1 %
Eosinophils Absolute: 0.1 10*3/uL (ref 0.0–0.5)
Eosinophils Relative: 1 %
HCT: 22.7 % — ABNORMAL LOW (ref 39.0–52.0)
Hemoglobin: 7.3 g/dL — ABNORMAL LOW (ref 13.0–17.0)
Immature Granulocytes: 3 %
Lymphocytes Relative: 8 %
Lymphs Abs: 0.3 10*3/uL — ABNORMAL LOW (ref 0.7–4.0)
MCH: 36.3 pg — ABNORMAL HIGH (ref 26.0–34.0)
MCHC: 32.2 g/dL (ref 30.0–36.0)
MCV: 112.9 fL — ABNORMAL HIGH (ref 80.0–100.0)
Monocytes Absolute: 0.4 10*3/uL (ref 0.1–1.0)
Monocytes Relative: 10 %
Neutro Abs: 2.9 10*3/uL (ref 1.7–7.7)
Neutrophils Relative %: 77 %
Platelets: 16 10*3/uL — CL (ref 150–400)
RBC: 2.01 MIL/uL — ABNORMAL LOW (ref 4.22–5.81)
RDW: 16.5 % — ABNORMAL HIGH (ref 11.5–15.5)
WBC: 3.7 10*3/uL — ABNORMAL LOW (ref 4.0–10.5)
nRBC: 0.5 % — ABNORMAL HIGH (ref 0.0–0.2)

## 2023-08-17 LAB — MAGNESIUM: Magnesium: 2.2 mg/dL (ref 1.7–2.4)

## 2023-08-17 MED ORDER — AMOXICILLIN-POT CLAVULANATE 875-125 MG PO TABS
1.0000 | ORAL_TABLET | Freq: Two times a day (BID) | ORAL | 0 refills | Status: AC
  Filled 2023-08-17: qty 8, 4d supply, fill #0

## 2023-08-17 MED ORDER — IPRATROPIUM-ALBUTEROL 0.5-2.5 (3) MG/3ML IN SOLN: 3.0000 mL | Freq: Four times a day (QID) | RESPIRATORY_TRACT | Status: DC | PRN

## 2023-08-17 NOTE — Discharge Summary (Addendum)
Physician Discharge Summary   Patient: Andre [name] MRN: [id number] DOB: [birth year]  Admit date:     08/11/2023  Discharge date: 08/17/23  Discharge Physician: Alba Cory   PCP: [name], MD   Recommendations at discharge:    Please follow Fungitel results.  Needs to follow up with Dr Truett Perna, to monitor platelet and Hb  Discharge Diagnoses: Principal Problem:   FUO (fever of unknown origin) Active Problems:   Pancytopenia (HCC)   Chronic respiratory failure with hypoxia (HCC)   History of multiple myeloma   Wheezing   Diaphragmatic hernia   Hypothyroidism   Mild cognitive impairment   History of engineered cell therapy infusion   Multiple rib fractures   Multiple myeloma in remission (HCC)  Resolved Problems:   * No resolved hospital problems. [facility] Course: 72 year old with past medical history significant of relapsed/refractory IgG lambda multiple myeloma status post eighth line therapy and CAR T-cell therapy January 2024) protracted cytopenias, functional cognitive decline, multiple recurrent infections, chronic left hiatal hernia, chronic elevated right hemidiaphragm, multiple unhealed pathologic fractures of the ribs bibasilar atelectasis who has had recurrent idiopathic fever since CAR T therapy in January 2024. He again has been admitted with fevers, ID following. Fever of unknown etiology.    Assessment and Plan: 1-Fever of unknown origin: probably related to aspiration PNA -These fevers have persisted since CAR T therapy in Jan 2024 (?cytokine release syndrome).  -Infectious workup: CT angio chest; negative for acute PE, low volumes with confluent atelectasis, UA 21-50 white blood cell  -Blood culture: No growth to date -Urine culture: More than 100,000 colony E. coli 40,000 colonies Citrobacter -COVID PCR negative -Daughter does report patient some times cough after drinking water or eating. Repeated chest x ray raise concern for  Aspiration. Discussed with ID plan to start Augmentin for 5 days.  -Evaluated by speech continue with current diet. Needs to be seating up for meals.   -He is doing well today. Plan to treat for aspiration PNA for 5 days. Augmentin should cover for E coli in urine.   History of multiple myeloma -Clinically in remission bone marrow biopsy in February revealed no evidence of myeloma Appreciate Dr Truett Perna assistance.    Chronic respiratory failure with hypoxia: -stable on 2 L. Required overnight 6 L, repeated chest x ray stable.  Monitor, back down on 2 L.    Pancytopenia -He is on weekly Nplate with average platelet count 20-30k -Monthly IVIG Plan to hold NPlate due to fever.    Respiratory system wheezing: resolved.  Multiple ribs fractures -incentive spirometry      History of engineered cell therapy infusion - s/p "CAR T cell therapy with [place] on 09/26/2022 to which he achieved an MRD negative very good partial response"      Mild cognitive impairment; support care.      Hypothyroidism Continue with Synthroid.    Diaphragmatic hernia - continue incentive spirometer  PPI    Hypokalemia;Replaced   Estimated body mass index is 27.63 kg/m as calculated from the following:   Height as of this encounter: 5\' 7"  (1.702 m).   Weight as of this encounter: 80 kg.         Consultants: ID, Oncology  Procedures performed: None Disposition: Skilled nursing facility Diet recommendation:  Discharge Diet Orders (From admission, onward)     Start     Ordered   08/17/23 0000  Diet - low sodium heart healthy  08/17/23 0942           Regular diet DISCHARGE MEDICATION: Allergies as of 08/17/2023       Reactions   Atorvastatin Other (See Comments)   (LIPITOR) "Allergic," per [facility]   Rosuvastatin Other (See Comments)   (CRESTOR) Liver Disorder (adverse effects) and "Allergic," per [facility]   Beef-derived Products Other (See Comments)   pescatarian   Chicken  Protein Other (See Comments)   pescatarian   Pork-derived Products Other (See Comments)   pescatarian   Septra [sulfamethoxazole-trimethoprim] Rash, Other (See Comments)   "Allergic," per [facility]        Medication List     TAKE these medications    acetaminophen 650 MG suppository Commonly known as: TYLENOL Place 650 mg rectally every 4 (four) hours as needed for fever (>100.6). What changed: Another medication with the same name was changed. Make sure you understand how and when to take each.   acetaminophen 500 MG tablet Commonly known as: TYLENOL Take 2 tablets (1,000 mg total) by mouth every 8 (eight) hours. What changed:  when to take this reasons to take this   amoxicillin-clavulanate 875-125 MG tablet Commonly known as: AUGMENTIN Take 1 tablet by mouth every 12 (twelve) hours for 4 days.   Calcium + Vitamin D3 500-5 MG-MCG Tabs Generic drug: Calcium Carb-Cholecalciferol Take 1 tablet by mouth in the morning.   calcium carbonate 750 MG chewable tablet Commonly known as: TUMS EX Chew 2 tablets by mouth 3 (three) times daily as needed (for indigestion).   cyanocobalamin 1000 MCG tablet Commonly known as: VITAMIN B12 Take 1 tablet (1,000 mcg total) by mouth daily. What changed: when to take this   dapsone 100 MG tablet Take 100 mg by mouth daily.   docusate sodium 100 MG capsule Commonly known as: COLACE Take 100 mg by mouth daily as needed (for constipation).   escitalopram 10 MG tablet Commonly known as: LEXAPRO Take 1 tablet (10 mg total) by mouth daily.   ferrous sulfate 325 (65 FE) MG EC tablet Take 325 mg by mouth 2 (two) times daily.   folic acid 1 MG tablet Commonly known as: FOLVITE Take 1 tablet (1 mg total) by mouth daily. What changed: when to take this   levothyroxine 125 MCG tablet Commonly known as: SYNTHROID Take 125 mcg by mouth daily before breakfast.   methylphenidate 5 MG tablet Commonly known as: Ritalin Take 0.5 tablets (2.5 mg  total) by mouth every morning.   midodrine 2.5 MG tablet Commonly known as: PROAMATINE Take 1 tablet (2.5 mg total) by mouth 2 (two) times daily with a meal. What changed: additional instructions   Muscle Rub 10-15 % Crea Apply 1 Application topically as needed for muscle pain.   ondansetron 4 MG disintegrating tablet Commonly known as: ZOFRAN-ODT Take 4 mg by mouth every 6 (six) hours as needed for nausea or vomiting.   pantoprazole 40 MG tablet Commonly known as: Protonix Take 1 tablet (40 mg total) by mouth daily. What changed: when to take this   tamsulosin 0.4 MG Caps capsule Commonly known as: FLOMAX Take 0.4 mg by mouth at bedtime.   traMADol 50 MG tablet Commonly known as: ULTRAM Take 50 mg by mouth every 12 (twelve) hours as needed for moderate pain (pain score 4-6) or severe pain (pain score 7-10).   UP4 PROBIOTICS WOMENS PO Take 1 capsule by mouth daily. Hyperbiotics pro women wit D manos plus cranberry   valACYclovir 500 MG tablet Commonly known as:  VALTREX Take 500 mg by mouth in the morning.   Vitamin D3 25 MCG (1000 UT) Caps Take 1,000 Units by mouth daily.        Discharge Exam: Filed Weights   08/11/23 1059  Weight: 80 kg   General; NAD  Condition at discharge: stable  The results of significant diagnostics from this hospitalization (including imaging, microbiology, ancillary and laboratory) are listed below for reference.   Imaging Studies: DG CHEST PORT 1 VIEW  Result Date: 08/15/2023 CLINICAL DATA:  Hypoxia EXAM: PORTABLE CHEST 1 VIEW COMPARISON:  Chest radiograph dated 08/11/2023 FINDINGS: Unchanged asymmetric elevation of the right hemidiaphragm. Normal lung volumes. Left basilar patchy and dense retrocardiac opacities. No pleural effusion or pneumothorax. The heart size and mediastinal contours are within normal limits. Similar appearance of multiple bilateral rib fractures. IMPRESSION: Left basilar patchy and dense retrocardiac  opacities, which may represent combination of atelectasis and known hiatal hernia. Aspiration or pneumonia can be considered in the appropriate clinical setting. Electronically Signed   By: Agustin Cree M.D.   On: 08/15/2023 14:16   CT HEAD WO CONTRAST ( )  Result Date: 08/11/2023 CLINICAL DATA:  Unwitnessed fall EXAM: CT HEAD WITHOUT CONTRAST TECHNIQUE: Contiguous axial images were obtained from the base of the skull through the vertex without intravenous contrast. RADIATION DOSE REDUCTION: This exam was performed according to the departmental dose-optimization program which includes automated exposure control, adjustment of the mA and/or kV according to patient size and/or use of iterative reconstruction technique. COMPARISON:  05/23/2023 FINDINGS: Brain: No evidence of acute infarction, hemorrhage, mass, mass effect, or midline shift. No hydrocephalus or extra-axial fluid collection. Periventricular white matter changes, likely the sequela of chronic small vessel ischemic disease. Redemonstrated remote left cerebellar infarct. Vascular: No hyperdense vessel. Skull: Negative for fracture. Redemonstrated lucencies in the calvarium, consistent with the patient's history of multiple myeloma. Sinuses/Orbits: Mucosal thickening in the ethmoid air cells. No acute finding in the orbits. Other: The mastoid air cells are well aerated. IMPRESSION: No acute intracranial process. Electronically Signed   By: Wiliam Ke M.D.   On: 08/11/2023 22:20   CT Angio Chest PE W/Cm &/Or Wo Cm  Result Date: 08/11/2023 CLINICAL DATA:  72 year old male with fever and body ache. Undergoing chemotherapy for multiple myeloma. EXAM: CT ANGIOGRAPHY CHEST WITH CONTRAST TECHNIQUE: Multidetector CT imaging of the chest was performed using the standard protocol during bolus administration of intravenous contrast. Multiplanar CT image reconstructions and MIPs were obtained to evaluate the vascular anatomy. RADIATION DOSE REDUCTION: This  exam was performed according to the departmental dose-optimization program which includes automated exposure control, adjustment of the mA and/or kV according to patient size and/or use of iterative reconstruction technique. CONTRAST:  75mL OMNIPAQUE IOHEXOL 350 MG/ML SOLN COMPARISON:  Portable chest 1123 hours today. Previous chest CTA 05/20/2023. Recent chest CT 08/05/2023. FINDINGS: Cardiovascular: Good contrast bolus timing in the pulmonary arterial tree. No pulmonary artery filling defect identified. Calcified aortic atherosclerosis. Calcified coronary artery atherosclerosis. Heart size within normal limits. No pericardial effusion. Mediastinum/Nodes: Moderate to large gastric hiatal hernia. No mediastinal lymphadenopathy. Lungs/Pleura: Low lung volumes superimposed on chronic gastric hiatal hernia and elevated right hemidiaphragm. Confluent bilateral lower lobe atelectasis. Central airways remain patent. No convincing acute pneumonia, acute lung inflammation. Upper Abdomen: Stable, negative visible mostly noncontrast upper abdominal viscera. Musculoskeletal: Generalized bone mineralization heterogeneity of multiple myeloma. Chronic T12 compression fracture. Numerous chronic rib fractures. Un healed pathologic fractures of the lateral left 7th and 8th ribs, similar to recent chest CT.  Unhealed posterior left 9th rib fracture is mildly more displaced from the recent CT. No brand new osseous abnormality is identified. Review of the MIP images confirms the above findings. IMPRESSION: 1. Negative for acute pulmonary embolus. 2. Low lung volumes with confluent atelectasis, in part due to chronic hiatal hernia on the left. No pleural effusion or convincing pneumonia. 3. Multiple myeloma. Un-healed pathologic fractures of the left ribs 7 through 9, slightly more displaced since 08/05/2023. 4. Aortic Atherosclerosis (ICD10-I70.0). Coronary artery atherosclerosis. Electronically Signed   By: Odessa Fleming M.D.   On:  08/11/2023 12:55   DG Chest Port 1 View  Result Date: 08/11/2023 CLINICAL DATA:  Hypoxia and fever.  History of multiple myeloma. EXAM: PORTABLE CHEST 1 VIEW COMPARISON:  08/04/2023 FINDINGS: Stable cardiomediastinal contours. Unchanged asymmetric elevation of the right hemidiaphragm. No pleural effusion, airspace consolidation, or pneumothorax. No signs of interstitial edema. Multiple remote healed posterior and lateral right rib fractures, no acute osseous findings noted. IMPRESSION: No acute cardiopulmonary abnormalities. Unchanged chronic asymmetric  elevation of the right hemidiaphragm. Electronically Signed   By: Signa Kell M.D.   On: 08/11/2023 11:36   MR TMJ  Result Date: 08/06/2023 CLINICAL DATA:  Jaw pain for 2 months EXAM: MRI OF TEMPOROMANDIBULAR JOINT WITHOUT CONTRAST TECHNIQUE: Multiplanar, multisequence MR imaging of the temporomandibular joint was performed following the standard protocol. No intravenous contrast was administered. Sequential bite blocks of 4 mm, 8 mm, 12 mm, 16 mm, and 20 mm were used for open mouth sequences. COMPARISON:  CT 05/23/2023, MRI 05/21/2023 FINDINGS: Technical Note: Despite efforts by the technologist and patient, motion artifact is present on today's exam and could not be eliminated. This reduces exam sensitivity and specificity. Right temporomandibular joint: Moderate osteoarthritis of the right temporomandibular joint with mild flattening of the mandibular condyle. Diminutive appearance of the articular disc. Disc does remain positioned between the mandibular condyle and temporal bone in both open and closed mouth positions. There is normal anterior translation of the mandibular condyle with jaw opening. No erosions or joint effusion. Left temporomandibular joint: Moderate osteoarthritis of the left temporomandibular joint with mild flattening of the mandibular condyle. Diminutive appearance of the articular disc. Disc does remain positioned between the  mandibular condyle and temporal bone in both open and closed mouth positions. There is normal anterior translation of the mandibular condyle with jaw opening. No erosions or joint effusion. Other: None. IMPRESSION: 1. 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 2. Diminutive appearance of the bilateral articular discs. Discs do remain positioned between the mandibular condyles and temporal bones in both open and closed mouth positions. Electronically Signed   By: Duanne Guess D.O.   On: 08/06/2023 15:56   CT CHEST WO CONTRAST  Result Date: 08/05/2023 CLINICAL DATA:  Respiratory illness. EXAM: CT CHEST WITHOUT CONTRAST TECHNIQUE: Multidetector CT imaging of the chest was performed following the standard protocol without IV contrast. RADIATION DOSE REDUCTION: This exam was performed according to the departmental dose-optimization program which includes automated exposure control, adjustment of the mA and/or kV according to patient size and/or use of iterative reconstruction technique. COMPARISON:  May 20, 2023 FINDINGS: Cardiovascular: Calcific atherosclerotic disease of the coronary arteries. Normal heart size. No pericardial effusion. Mediastinum/Nodes: No enlarged mediastinal or axillary lymph nodes. Thyroid gland, trachea, and esophagus demonstrate no significant findings. Lungs/Pleura: Streaky airspace opacities in the right more the left lung base with mild pleural thickening. These findings are not significantly changed from September 2024. Upper Abdomen: Cholelithiasis. Large hiatal hernia containing  most of the gastric body. Musculoskeletal: Healed bilateral rib fractures. Chronic mild T12 compression fracture. IMPRESSION: 1. Streaky airspace opacities in the right more the left lung base with mild pleural thickening. These findings are not significantly changed from September 2024. 2. Calcific atherosclerotic disease of the coronary arteries. 3. Cholelithiasis. 4. Large  hiatal hernia containing most of the gastric body. 5. Chronic mild T12 compression fracture. 6. Healed bilateral rib fractures. 7. Aortic atherosclerosis. Aortic Atherosclerosis (ICD10-I70.0). Electronically Signed   By: Ted Mcalpine M.D.   On: 08/05/2023 19:36   DG Chest Port 1 View  Result Date: 08/04/2023 CLINICAL DATA:  History of multiple myeloma with sepsis EXAM: PORTABLE CHEST 1 VIEW COMPARISON:  Chest radiograph dated 06/20/2023 FINDINGS: Unchanged elevation of the right hemidiaphragm. Normal lung volumes. Heterogeneous left retrocardiac opacity. No pleural effusion or pneumothorax. The heart size and mediastinal contours are within normal limits. No acute osseous abnormality. IMPRESSION: 1. Heterogeneous left retrocardiac opacity, likely reflecting known hernia. 2.  No new focal consolidations. Electronically Signed   By: Agustin Cree M.D.   On: 08/04/2023 12:01    Microbiology: Results for orders placed or performed during the hospital encounter of 08/11/23  SARS Coronavirus 2 by RT PCR (hospital order, performed in [facility] hospital lab) *cepheid single result test* Anterior Nasal Swab     Status: None   Collection Time: 08/11/23 11:18 AM   Specimen: Anterior Nasal Swab  Result Value Ref Range Status   SARS Coronavirus 2 by RT PCR NEGATIVE NEGATIVE Final    Comment: (NOTE) SARS-CoV-2 target nucleic acids are NOT DETECTED.  The SARS-CoV-2 RNA is generally detectable in upper and lower respiratory specimens during the acute phase of infection. The lowest concentration of SARS-CoV-2 viral copies this assay can detect is 250 copies / mL. A negative result does not preclude SARS-CoV-2 infection and should not be used as the sole basis for treatment or other patient management decisions.  A negative result may occur with improper specimen collection / handling, submission of specimen other than nasopharyngeal swab, presence of viral mutation(s) within the areas targeted by this  assay, and inadequate number of viral copies (<250 copies / mL). A negative result must be combined with clinical observations, patient history, and epidemiological information.  Fact Sheet for Patients:   RoadLapTop.co.za  Fact Sheet for Healthcare Providers: http://kim-miller.com/  This test is not yet approved or  cleared by the [place] FDA and has been authorized for detection and/or diagnosis of SARS-CoV-2 by FDA under an Emergency Use Authorization (EUA).  This EUA will remain in effect (meaning this test can be used) for the duration of the COVID-19 declaration under Section 564(b)(1) of the Act, 21 U.S.C. section 360bbb-3(b)(1), unless the authorization is terminated or revoked sooner.  Performed at [facility], [address], [place], [place] [address]   Urine Culture     Status: Abnormal   Collection Time: 08/11/23  1:29 PM   Specimen: Urine, Random  Result Value Ref Range Status   Specimen Description   Final    URINE, RANDOM Performed at [facility], [address] [place], [place] [address]    Special Requests   Final    NONE Reflexed from [id number] Performed at [facility], [address], [place], [place] [address]    Culture (A)  Final    >=100,000 COLONIES/mL ESCHERICHIA COLI 40,000 COLONIES/mL CITROBACTER FREUNDII 20,000 COLONIES/mL ENTEROCOCCUS FAECALIS    Report Status 08/15/2023 FINAL  Final  Organism ID, Bacteria ESCHERICHIA COLI (A)  Final   Organism ID, Bacteria CITROBACTER FREUNDII (A)  Final   Organism ID, Bacteria ENTEROCOCCUS FAECALIS (A)  Final      Susceptibility   Citrobacter freundii - MIC*    CEFEPIME 2 SENSITIVE Sensitive     CEFTRIAXONE >=64 RESISTANT Resistant     CIPROFLOXACIN <=0.25 SENSITIVE Sensitive     GENTAMICIN <=1 SENSITIVE Sensitive     IMIPENEM <=0.25 SENSITIVE Sensitive     NITROFURANTOIN <=16 SENSITIVE Sensitive      TRIMETH/SULFA <=20 SENSITIVE Sensitive     PIP/TAZO >=128 RESISTANT Resistant ug/mL    * 40,000 COLONIES/mL CITROBACTER FREUNDII   Escherichia coli - MIC*    AMPICILLIN 8 SENSITIVE Sensitive     CEFAZOLIN <=4 SENSITIVE Sensitive     CEFEPIME <=0.12 SENSITIVE Sensitive     CEFTRIAXONE <=0.25 SENSITIVE Sensitive     CIPROFLOXACIN <=0.25 SENSITIVE Sensitive     GENTAMICIN <=1 SENSITIVE Sensitive     IMIPENEM <=0.25 SENSITIVE Sensitive     NITROFURANTOIN <=16 SENSITIVE Sensitive     TRIMETH/SULFA <=20 SENSITIVE Sensitive     AMPICILLIN/SULBACTAM <=2 SENSITIVE Sensitive     PIP/TAZO <=4 SENSITIVE Sensitive ug/mL    * >=100,000 COLONIES/mL ESCHERICHIA COLI   Enterococcus faecalis - MIC*    AMPICILLIN <=2 SENSITIVE Sensitive     NITROFURANTOIN <=16 SENSITIVE Sensitive     VANCOMYCIN 1 SENSITIVE Sensitive     * 20,000 COLONIES/mL ENTEROCOCCUS FAECALIS  Culture, blood (Routine X 2) w Reflex to ID Panel     Status: None (Preliminary result)   Collection Time: 08/11/23  6:16 PM   Specimen: BLOOD RIGHT HAND  Result Value Ref Range Status   Specimen Description   Final    BLOOD RIGHT HAND Performed at [facility], [address], [place], [place] [address]    Special Requests   Final    BOTTLES DRAWN AEROBIC ONLY Blood Culture adequate volume Performed at [facility], [address] [place], [place] [address]    Culture   Final    NO GROWTH 4 DAYS Performed at [facility], [address] [place], [place] [address]    Report Status PENDING  Incomplete  Culture, blood (Routine X 2) w Reflex to ID Panel     Status: None (Preliminary result)   Collection Time: 08/11/23  6:16 PM   Specimen: BLOOD LEFT HAND  Result Value Ref Range Status   Specimen Description   Final    BLOOD LEFT HAND Performed at [facility], [address], [place], [place] [address]    Special Requests   Final    BOTTLES DRAWN AEROBIC ONLY Blood Culture adequate  volume Performed at [facility], [address] [place], [place] [address]    Culture   Final    NO GROWTH 4 DAYS Performed at [facility], [address], [place], [place] [address]    Report Status PENDING  Incomplete   *Note: Due to a large number of results and/or encounters for the requested time period, some results have not been displayed. A complete set of results can be found in Results Review.    Labs: CBC: Recent Labs  Lab 08/13/23 0303 08/14/23 0343 08/15/23 0409 08/16/23 0402 08/17/23 0333  WBC 3.3* 4.0 3.3* 3.3* 3.7*  NEUTROABS 2.1 3.0 2.9 2.5 2.9  HGB 8.0* 8.0* 7.5* 7.3* 7.3*  HCT 25.5* 23.9* 22.9* 22.6* 22.7*  MCV 115.9* 113.8* 111.7* 111.9* 112.9*  PLT 19* 17* 17* 16* 16*   Basic Metabolic Panel: Recent Labs  Lab 08/13/23 0303 08/14/23 0343 08/15/23 0409 08/16/23 0402 08/17/23 0333  NA 137 135 137 132* 137  K 3.5 3.6 3.5 3.3* 3.6  CL 105 102 102 98 104  CO2 25 26 28 27 26   GLUCOSE 97 104* 94 94 103*  BUN 15 16 13 15 14   CREATININE 0.74 0.86 0.72 0.82 0.81  CALCIUM 9.1 8.8* 9.3 8.8* 8.7*  MG 2.2 2.2 2.0 2.2 2.2   Liver Function Tests: Recent Labs  Lab 08/11/23 1118  AST 19  ALT 10  ALKPHOS 64  BILITOT 0.5  PROT 6.2*  ALBUMIN 3.3*   CBG: No results for input(s): "GLUCAP" in the last 168 hours.  Discharge time spent: greater than 30 minutes.  Signed: Alba Cory, MD [facility] 08/17/2023

## 2023-08-17 NOTE — TOC Transition Note (Signed)
Transition of Care [facility]) - CM/SW Discharge Note   Patient Details  Name: Andre [name] MRN: [id number] Date of Birth: 1950/10/30  Transition of Care [facility]) CM/SW Contact:  Amada Jupiter, LCSW Phone Number: 08/17/2023, 10:58 AM   Clinical Narrative:    Pt medically cleared for dc today back to SNF at [facility].  Pt and family aware and agreeable.  PTAR called at 10:55am.  RN to call report to [phone].  No further TOC needs.   Final next level of care: Skilled Nursing Facility Barriers to Discharge: Barriers Resolved   Patient Goals and CMS Choice      Discharge Placement                Patient chooses bed at: [facility] Patient to be transferred to facility by: PTAR Name of family member notified: spouse, [name] Patient and family notified of of transfer: 08/17/23  Discharge Plan and Services Additional resources added to the After Visit Summary for   In-house Referral: Clinical Social Work   Post Acute Care Choice: Skilled Nursing Facility          DME Arranged: N/A DME Agency: NA                  Social Determinants of Health (SDOH) Interventions SDOH Screenings   Food Insecurity: No Food Insecurity (08/11/2023)  Housing: Low Risk  (08/11/2023)  Transportation Needs: No Transportation Needs (08/11/2023)  Utilities: Not At Risk (08/11/2023)  Depression (PHQ2-9): Low Risk  (06/08/2022)  Tobacco Use: Medium Risk (08/11/2023)     Readmission Risk Interventions    08/17/2023   10:57 AM 06/20/2023   12:59 PM  Readmission Risk Prevention Plan  Transportation Screening Complete Complete  PCP or Specialist Appt within 3-5 Days  Complete  HRI or Home Care Consult  Complete  Social Work Consult for Recovery Care Planning/Counseling  Complete  Palliative Care Screening  Not Applicable  Medication Review Oceanographer) Complete Complete  PCP or Specialist appointment within 3-5 days of discharge Complete   HRI or Home Care Consult Complete    Palliative Care Screening Not Applicable   Skilled Nursing Facility Complete

## 2023-08-20 ENCOUNTER — Encounter: Payer: Self-pay | Admitting: Internal Medicine

## 2023-08-20 ENCOUNTER — Non-Acute Institutional Stay (SKILLED_NURSING_FACILITY): Payer: Self-pay | Admitting: Internal Medicine

## 2023-08-20 DIAGNOSIS — R195 Other fecal abnormalities: Secondary | ICD-10-CM

## 2023-08-20 DIAGNOSIS — A689 Relapsing fever, unspecified: Secondary | ICD-10-CM | POA: Diagnosis not present

## 2023-08-20 DIAGNOSIS — R4189 Other symptoms and signs involving cognitive functions and awareness: Secondary | ICD-10-CM

## 2023-08-20 DIAGNOSIS — E039 Hypothyroidism, unspecified: Secondary | ICD-10-CM | POA: Diagnosis not present

## 2023-08-20 DIAGNOSIS — D61818 Other pancytopenia: Secondary | ICD-10-CM

## 2023-08-20 DIAGNOSIS — I951 Orthostatic hypotension: Secondary | ICD-10-CM | POA: Diagnosis not present

## 2023-08-20 DIAGNOSIS — C9000 Multiple myeloma not having achieved remission: Secondary | ICD-10-CM

## 2023-08-20 DIAGNOSIS — F32A Depression, unspecified: Secondary | ICD-10-CM

## 2023-08-20 DIAGNOSIS — R0902 Hypoxemia: Secondary | ICD-10-CM

## 2023-08-20 LAB — MULTIPLE MYELOMA PANEL, SERUM
Albumin SerPl Elph-Mcnc: 3 g/dL (ref 2.9–4.4)
Albumin/Glob SerPl: 1.3 (ref 0.7–1.7)
Alpha 1: 0.4 g/dL (ref 0.0–0.4)
Alpha2 Glob SerPl Elph-Mcnc: 0.8 g/dL (ref 0.4–1.0)
B-Globulin SerPl Elph-Mcnc: 0.8 g/dL (ref 0.7–1.3)
Gamma Glob SerPl Elph-Mcnc: 0.5 g/dL (ref 0.4–1.8)
Globulin, Total: 2.5 g/dL (ref 2.2–3.9)
IgA: <5 mg/dL — ABNORMAL LOW (ref 61–437)
IgG (Immunoglobin G), Serum: 637 mg/dL (ref 603–1613)
IgM (Immunoglobulin M), Srm: <5 mg/dL — ABNORMAL LOW (ref 15–143)
Total Protein ELP: 5.5 g/dL — ABNORMAL LOW (ref 6.0–8.5)

## 2023-08-20 NOTE — Progress Notes (Signed)
Provider:   Location:  [facility] Room Number: 153A Place of Service:  SNF (31)  PCP: [name], MD Patient Care Team: [name], MD as PCP - General (Internal Medicine) Ronalee Red, Wynelle Cleveland, MD as Consulting Physician (Neurology) Eddie Candle, MD as Consulting Physician (Internal Medicine) York Spaniel, MD (Inactive) as Consulting Physician (Neurology) Sharlett Iles, DDS as Referring Physician (Oral Surgery) Ladene Artist, MD as Consulting Physician (Oncology) Fletcher Anon, NP as Nurse Practitioner (Nurse Practitioner)  Extended Emergency Contact Information Primary Emergency Contact: Batton,[name] Address: [address]          [place], [place] [address] [name] of [place] Home Phone: [phone] Relation: Spouse  Code Status: DNR Goals of Care: Advanced Directive information    08/20/2023    9:26 AM  Advanced Directives  Does Patient Have a Medical Advance Directive? Yes  Type of Advance Directive Out of facility DNR (pink MOST or yellow form)  Does patient want to make changes to medical advance directive? No - Patient declined  Would patient like information on creating a medical advance directive? No - Patient declined  Pre-existing out of facility DNR order (yellow form or pink MOST form) Yellow form placed in chart (order not valid for inpatient use)      Chief Complaint  Patient presents with   New Admit To SNF    Patient is being seen for New admission to SNF   Immunizations    Patient is due for pneumonia vaccine    Health Maintenance    Patient is due for AWV and colonoscopy after 10/05/2023     HPI: Patient is a 72 y.o. male seen today for Readmission to SNF  Patient was admitted in hospital from 11/23-11/29 for Fever and Hypoxia  Patient has h/o Multiple Myeloma diagnosed in 2012 Post CART therapy per Dr Marissa Calamity on 01/24  In Clinical Remission On IVIG monthly per  Oncology [facility] due to CART and Chronic Microvascular changes On Ritalin Recurent falls due to Unstable Gait Anemia with Occult positive stools Pancytopenia due to BM failure   Patient has had number of admissions in the hospital due to Anemia and hypertension in 06/14/23 Sepsis in 05/20/23 Pneumonia in 08/24  SIRS in 07/2023  Sent again to the hospital on 11/23 from [facility] for High Grade fever of 102 with Hypoxia CT Chest ango negative for any reason for acute for fever urine culture was positive for 100 K E. coli but per ID diagnosis asymptomatic bacteriuria.  Repeat chest x-ray done showed questionable possible aspiration and he was discharged on Augmentin for 5 days. Blood cultures negative He does continue to stay hypoxic and needs oxygen His only complaint today was a little abdominal discomfort but no pain nausea or diarrhea.    Past Medical History:  Diagnosis Date   Anemia    Arthritis    Asthma    no treatment x 20 years   Depression    Double vision    occurs at times    Duodenal ulcer    GERD (gastroesophageal reflux disease)    Hemorrhage of rectum and anus 09/15/2013   Hyperlipidemia    Hypertension    Hypothyroidism    Leukopenia due to antineoplastic chemotherapy (HCC) 02/22/2017   Multiple myeloma 07/04/2011   Thyroid disease    TIA (transient ischemic attack) 03/27/2012   Transient diplopia 03/27/2012   Transient global amnesia 06/07/2015   Past Surgical History:  Procedure Laterality Date  BONE MARROW TRANSPLANT  2011   for MM   CARDIOLITE STUDY  11/25/2003   NORMAL   TONSILLECTOMY      reports that he quit smoking about 54 years ago. His smoking use included cigarettes. He started smoking about 57 years ago. He has never used smokeless tobacco. He reports that he does not drink alcohol and does not use drugs. Social History   Socioeconomic History   Marital status: Married    Spouse name: Not on file   Number of children: 2   Years of education:  16   Highest education level: Not on file  Occupational History   Not on file  Tobacco Use   Smoking status: Former    Current packs/day: 0.00    Types: Cigarettes    Start date: 03/27/1966    Quit date: 03/27/1969    Years since quitting: 54.4   Smokeless tobacco: Never  Vaping Use   Vaping status: Never Used  Substance and Sexual Activity   Alcohol use: No   Drug use: No   Sexual activity: Not Currently  Other Topics Concern   Not on file  Social History Narrative   Right handed   Unc fan   Drinks caffeine   [facility]   Remarreid 8 years    Social Determinants of Health   Financial Resource Strain: Not on file  Food Insecurity: No Food Insecurity (08/11/2023)   Hunger Vital Sign    Worried About Running Out of Food in the Last Year: Never true    Ran Out of Food in the Last Year: Never true  Transportation Needs: No Transportation Needs (08/11/2023)   PRAPARE - Administrator, Civil Service (Medical): No    Lack of Transportation (Non-Medical): No  Physical Activity: Not on file  Stress: Not on file  Social Connections: Not on file  Intimate Partner Violence: Not At Risk (08/11/2023)   Humiliation, Afraid, Rape, and Kick questionnaire    Fear of Current or Ex-Partner: No    Emotionally Abused: No    Physically Abused: No    Sexually Abused: No    Functional Status Survey:    Family History  Problem Relation Age of Onset   Throat cancer Mother    Hypertension Father    Stroke Father    Asthma Father    Diabetes Father    Pancreatic cancer Brother     Health Maintenance  Topic Date Due   Medicare Annual Wellness (AWV)  Never done   Pneumonia Vaccine 36+ Years old (3 of 3 - PPSV23 or PCV20) 06/16/2018   Colonoscopy  09/24/2023   Zoster Vaccines- Shingrix (1 of 2) 02/18/2024 (Originally 06/06/1970)   COVID-19 Vaccine (7 - 2023-24 season) 09/04/2023   DTaP/Tdap/Td (5 - Tdap) 03/06/2028   INFLUENZA VACCINE  Completed   HPV VACCINES  Aged  Out   Hepatitis C Screening  Discontinued    Allergies  Allergen Reactions   Atorvastatin Other (See Comments)    (LIPITOR) "Allergic," per [facility]   Rosuvastatin Other (See Comments)    (CRESTOR) Liver Disorder (adverse effects) and "Allergic," per [facility]   Beef-Derived Products Other (See Comments)    pescatarian    Chicken Protein Other (See Comments)    pescatarian   Pork-Derived Products Other (See Comments)    pescatarian    Septra [Sulfamethoxazole-Trimethoprim] Rash and Other (See Comments)    "Allergic," per [facility]    Outpatient Encounter Medications as of 08/20/2023  Medication Sig  acetaminophen (TYLENOL) 500 MG tablet Take 2 tablets (1,000 mg total) by mouth every 8 (eight) hours. (Patient taking differently: Take 1,000 mg by mouth 3 (three) times daily as needed for mild pain (pain score 1-3) or moderate pain (pain score 4-6).)   acetaminophen (TYLENOL) 650 MG suppository Place 650 mg rectally every 4 (four) hours as needed for fever (>100.6).   amoxicillin-clavulanate (AUGMENTIN) 875-125 MG tablet Take 1 tablet by mouth every 12 (twelve) hours for 4 days.   Calcium Carb-Cholecalciferol (CALCIUM + VITAMIN D3) 500-5 MG-MCG TABS Take 1 tablet by mouth in the morning.   calcium carbonate (TUMS EX) 750 MG chewable tablet Chew 2 tablets by mouth 3 (three) times daily as needed (for indigestion).   Cholecalciferol (VITAMIN D3) 25 MCG (1000 UT) capsule Take 1,000 Units by mouth daily.   cyanocobalamin (VITAMIN B12) 1000 MCG tablet Take 1 tablet (1,000 mcg total) by mouth daily. (Patient taking differently: Take 1,000 mcg by mouth in the morning.)   dapsone 100 MG tablet Take 100 mg by mouth daily.   docusate sodium (COLACE) 100 MG capsule Take 100 mg by mouth daily as needed (for constipation).   escitalopram (LEXAPRO) 10 MG tablet Take 1 tablet (10 mg total) by mouth daily.   ferrous sulfate 325 (65 FE) MG EC tablet Take 325 mg by mouth 2 (two) times daily.   folic acid (FOLVITE) 1 MG  tablet Take 1 tablet (1 mg total) by mouth daily. (Patient taking differently: Take 1 mg by mouth in the morning.)   levothyroxine (SYNTHROID) 125 MCG tablet Take 125 mcg by mouth daily before breakfast.   Menthol-Methyl Salicylate (MUSCLE RUB) 10-15 % CREA Apply 1 Application topically as needed for muscle pain.   methylphenidate (RITALIN) 5 MG tablet Take 0.5 tablets (2.5 mg total) by mouth every morning.   midodrine (PROAMATINE) 2.5 MG tablet Take 1 tablet (2.5 mg total) by mouth 2 (two) times daily with a meal. (Patient taking differently: Take 2.5 mg by mouth 2 (two) times daily with a meal. Hold is SBP >140)   ondansetron (ZOFRAN-ODT) 4 MG disintegrating tablet Take 4 mg by mouth every 6 (six) hours as needed for nausea or vomiting.   pantoprazole (PROTONIX) 40 MG tablet Take 1 tablet (40 mg total) by mouth daily. (Patient taking differently: Take 40 mg by mouth daily with breakfast.)   Probiotic Product (UP4 PROBIOTICS WOMENS PO) Take 1 capsule by mouth daily. Hyperbiotics pro women wit D manos plus cranberry   tamsulosin (FLOMAX) 0.4 MG CAPS capsule Take 0.4 mg by mouth at bedtime.   traMADol (ULTRAM) 50 MG tablet Take 50 mg by mouth every 12 (twelve) hours as needed for moderate pain (pain score 4-6) or severe pain (pain score 7-10).   valACYclovir (VALTREX) 500 MG tablet Take 500 mg by mouth in the morning.   [DISCONTINUED] prochlorperazine (COMPAZINE) 10 MG tablet Take 1 tablet (10 mg total) by mouth every 6 (six) hours as needed (Nausea or vomiting).   No facility-administered encounter medications on file as of 08/20/2023.    Review of Systems  Constitutional:  Negative for activity change, appetite change and unexpected weight change.  HENT: Negative.    Respiratory:  Positive for shortness of breath. Negative for cough.   Cardiovascular:  Negative for leg swelling.  Gastrointestinal:  Negative for constipation.  Genitourinary:  Negative for frequency.  Musculoskeletal:  Positive  for gait problem. Negative for arthralgias and myalgias.  Skin: Negative.  Negative for rash.  Neurological:  Negative  for dizziness and weakness.  Psychiatric/Behavioral:  Positive for confusion. Negative for sleep disturbance.   All other systems reviewed and are negative.   Vitals:   08/20/23 0919  BP: 130/66  Pulse: 73  Resp: 18  Temp: 98.8 F (37.1 C)  TempSrc: Temporal  SpO2: 91%  Weight: 176 lb 12.8 oz (80.2 kg)  Height: 5\' 7"  (1.702 m)   Body mass index is 27.69 kg/m. Physical Exam Vitals reviewed.  Constitutional:      Appearance: Normal appearance.  HENT:     Head: Normocephalic.     Nose: Nose normal.     Mouth/Throat:     Mouth: Mucous membranes are moist.     Pharynx: Oropharynx is clear.  Eyes:     Pupils: Pupils are equal, round, and reactive to light.  Cardiovascular:     Rate and Rhythm: Normal rate and regular rhythm.     Pulses: Normal pulses.     Heart sounds: No murmur heard. Pulmonary:     Effort: Pulmonary effort is normal. No respiratory distress.     Breath sounds: Normal breath sounds. No rales.  Abdominal:     General: Abdomen is flat. Bowel sounds are normal.     Palpations: Abdomen is soft.  Musculoskeletal:        General: No swelling.     Cervical back: Neck supple.     Comments: Mild Swelling Bilateral  Skin:    General: Skin is warm.  Neurological:     General: No focal deficit present.     Mental Status: He is alert.  Psychiatric:        Mood and Affect: Mood normal.        Thought Content: Thought content normal.     Labs reviewed: Basic Metabolic Panel: Recent Labs    05/23/23 0413 06/06/23 0849 06/16/23 0536 06/17/23 0517 06/27/23 0933 08/02/23 0825 08/15/23 0409 08/16/23 0402 08/17/23 0333  NA 134*   < > 137   < > 139   < > 137 132* 137  K 3.7   < > 3.9   < > 3.7   < > 3.5 3.3* 3.6  CL 101   < > 104   < > 105   < > 102 98 104  CO2 25   < > 25   < > 25   < > 28 27 26   GLUCOSE 97   < > 92   < > 114*   < >  94 94 103*  BUN 21   < > 17   < > 16   < > 13 15 14   CREATININE 0.91   < > 0.92   < > 0.93   < > 0.72 0.82 0.81  CALCIUM 8.9   < > 8.8*   < > 9.9   < > 9.3 8.8* 8.7*  MG 2.1  --  2.3   < > 1.9   < > 2.0 2.2 2.2  PHOS 2.4*  --  3.7  --  2.8  --   --   --   --    < > = values in this interval not displayed.   Liver Function Tests: Recent Labs    08/04/23 1115 08/05/23 0532 08/11/23 1118  AST 24 20 19   ALT 18 16 10   ALKPHOS 74 71 64  BILITOT 0.6 0.7 0.5  PROT 6.9 6.8 6.2*  ALBUMIN 3.4* 3.3* 3.3*   Recent Labs    10/29/22 2042 05/05/23  1901 05/20/23 1643  LIPASE 57* 30 26   Recent Labs    05/20/23 1825  AMMONIA 26   CBC: Recent Labs    08/15/23 0409 08/16/23 0402 08/17/23 0333  WBC 3.3* 3.3* 3.7*  NEUTROABS 2.9 2.5 2.9  HGB 7.5* 7.3* 7.3*  HCT 22.9* 22.6* 22.7*  MCV 111.7* 111.9* 112.9*  PLT 17* 16* 16*   Cardiac Enzymes: Recent Labs    05/22/23 0420  CKTOTAL 28*   BNP: Invalid input(s): "POCBNP" Lab Results  Component Value Date   HGBA1C 4.3 (L) 05/06/2023   Lab Results  Component Value Date   TSH 0.173 (L) 05/20/2023   Lab Results  Component Value Date   VITAMINB12 1,163 (H) 05/20/2023   Lab Results  Component Value Date   FOLATE 27.7 05/20/2023   Lab Results  Component Value Date   IRON 199 (H) 06/06/2023   TIBC 461 (H) 06/06/2023   FERRITIN 115 08/11/2023    Imaging and Procedures obtained prior to SNF admission: CT HEAD WO CONTRAST ( )  Result Date: 08/11/2023 CLINICAL DATA:  Unwitnessed fall EXAM: CT HEAD WITHOUT CONTRAST TECHNIQUE: Contiguous axial images were obtained from the base of the skull through the vertex without intravenous contrast. RADIATION DOSE REDUCTION: This exam was performed according to the departmental dose-optimization program which includes automated exposure control, adjustment of the mA and/or kV according to patient size and/or use of iterative reconstruction technique. COMPARISON:  05/23/2023 FINDINGS:  Brain: No evidence of acute infarction, hemorrhage, mass, mass effect, or midline shift. No hydrocephalus or extra-axial fluid collection. Periventricular white matter changes, likely the sequela of chronic small vessel ischemic disease. Redemonstrated remote left cerebellar infarct. Vascular: No hyperdense vessel. Skull: Negative for fracture. Redemonstrated lucencies in the calvarium, consistent with the patient's history of multiple myeloma. Sinuses/Orbits: Mucosal thickening in the ethmoid air cells. No acute finding in the orbits. Other: The mastoid air cells are well aerated. IMPRESSION: No acute intracranial process. Electronically Signed   By: Wiliam Ke M.D.   On: 08/11/2023 22:20   CT Angio Chest PE W/Cm &/Or Wo Cm  Result Date: 08/11/2023 CLINICAL DATA:  72 year old male with fever and body ache. Undergoing chemotherapy for multiple myeloma. EXAM: CT ANGIOGRAPHY CHEST WITH CONTRAST TECHNIQUE: Multidetector CT imaging of the chest was performed using the standard protocol during bolus administration of intravenous contrast. Multiplanar CT image reconstructions and MIPs were obtained to evaluate the vascular anatomy. RADIATION DOSE REDUCTION: This exam was performed according to the departmental dose-optimization program which includes automated exposure control, adjustment of the mA and/or kV according to patient size and/or use of iterative reconstruction technique. CONTRAST:  75mL OMNIPAQUE IOHEXOL 350 MG/ML SOLN COMPARISON:  Portable chest 1123 hours today. Previous chest CTA 05/20/2023. Recent chest CT 08/05/2023. FINDINGS: Cardiovascular: Good contrast bolus timing in the pulmonary arterial tree. No pulmonary artery filling defect identified. Calcified aortic atherosclerosis. Calcified coronary artery atherosclerosis. Heart size within normal limits. No pericardial effusion. Mediastinum/Nodes: Moderate to large gastric hiatal hernia. No mediastinal lymphadenopathy. Lungs/Pleura: Low lung  volumes superimposed on chronic gastric hiatal hernia and elevated right hemidiaphragm. Confluent bilateral lower lobe atelectasis. Central airways remain patent. No convincing acute pneumonia, acute lung inflammation. Upper Abdomen: Stable, negative visible mostly noncontrast upper abdominal viscera. Musculoskeletal: Generalized bone mineralization heterogeneity of multiple myeloma. Chronic T12 compression fracture. Numerous chronic rib fractures. Un healed pathologic fractures of the lateral left 7th and 8th ribs, similar to recent chest CT. Unhealed posterior left 9th rib fracture is mildly more displaced from the  recent CT. No brand new osseous abnormality is identified. Review of the MIP images confirms the above findings. IMPRESSION: 1. Negative for acute pulmonary embolus. 2. Low lung volumes with confluent atelectasis, in part due to chronic hiatal hernia on the left. No pleural effusion or convincing pneumonia. 3. Multiple myeloma. Un-healed pathologic fractures of the left ribs 7 through 9, slightly more displaced since 08/05/2023. 4. Aortic Atherosclerosis (ICD10-I70.0). Coronary artery atherosclerosis. Electronically Signed   By: Odessa Fleming M.D.   On: 08/11/2023 12:55   DG Chest Port 1 View  Result Date: 08/11/2023 CLINICAL DATA:  Hypoxia and fever.  History of multiple myeloma. EXAM: PORTABLE CHEST 1 VIEW COMPARISON:  08/04/2023 FINDINGS: Stable cardiomediastinal contours. Unchanged asymmetric elevation of the right hemidiaphragm. No pleural effusion, airspace consolidation, or pneumothorax. No signs of interstitial edema. Multiple remote healed posterior and lateral right rib fractures, no acute osseous findings noted. IMPRESSION: No acute cardiopulmonary abnormalities. Unchanged chronic asymmetric  elevation of the right hemidiaphragm. Electronically Signed   By: Signa Kell M.D.   On: 08/11/2023 11:36    Assessment/Plan 1. Recurrent fever Seen by ID in the hospital There were no Signs of  Infection ID thinks it could be his ? Multiple Myeloma ? BM biposy  Oncology wants to wait Urince Culture Postivie ? Asymptomatic bacteriuria Chest x-ray showed possible aspiration.  Patient was treated with Augmentin for 5 days Discussed with the wife today.  We cancelled ID appointment for tomorrow. As per Dr Truett Perna he would be monitored in [facility] when he develops fever and will use as needed Tylenol.   I did discuss hospice.  Wife states that she is open to it but but not right now.  She wants to see how he does in the next few months 2. Pancytopenia (HCC) Bone mArrow Failure after CART therapy  He has stopped Nplate infusions as they were not helping her platelets and to see if it helps with her fever spikes  Transfuse Platelets PRN if Platelets less then 10000 3. Orthostatic hypotension Will continue low-dose of midodrine hold if systolic blood pressure more than 140 Patient has had fluctuating blood pressure and this is to see if it will help prevent his falls  4. Acquired hypothyroidism Lat TSH in 11/24 6.02  5. Cognitive impairment  Due to Vascular Disease and CART therapy  Continues to stay in SNF   6. Heme positive stool No work up due to Low Platelets  7. Multiple myeloma, remission status unspecified (HCC) On IVIG Per Oncology On Dapsone and Valtrex for Prophylaxis  8. Depression, unspecified depression type Doing well on Lexapro  9. Hypoxia Using oxygen as needed possibly due to atelectasis 10 BPH On Flomax   Family/ staff Communication:   Labs/tests ordered:

## 2023-08-21 ENCOUNTER — Ambulatory Visit: Payer: Medicare Other | Admitting: Infectious Diseases

## 2023-08-22 ENCOUNTER — Other Ambulatory Visit: Payer: Self-pay | Admitting: *Deleted

## 2023-08-22 DIAGNOSIS — C9000 Multiple myeloma not having achieved remission: Secondary | ICD-10-CM

## 2023-08-22 DIAGNOSIS — D696 Thrombocytopenia, unspecified: Secondary | ICD-10-CM

## 2023-08-22 LAB — FUNGITELL BETA-D-GLUCAN
Fungitell Value:: 32.722 pg/mL
Result Name:: NEGATIVE

## 2023-08-24 ENCOUNTER — Inpatient Hospital Stay: Payer: Medicare Other

## 2023-08-24 ENCOUNTER — Inpatient Hospital Stay: Payer: Medicare Other | Attending: Oncology

## 2023-08-24 ENCOUNTER — Ambulatory Visit: Payer: Medicare Other

## 2023-08-24 DIAGNOSIS — C9000 Multiple myeloma not having achieved remission: Secondary | ICD-10-CM | POA: Insufficient documentation

## 2023-08-24 DIAGNOSIS — D61818 Other pancytopenia: Secondary | ICD-10-CM | POA: Insufficient documentation

## 2023-08-24 DIAGNOSIS — D696 Thrombocytopenia, unspecified: Secondary | ICD-10-CM

## 2023-08-24 LAB — CBC WITH DIFFERENTIAL (CANCER CENTER ONLY)
Abs Immature Granulocytes: 0.13 10*3/uL — ABNORMAL HIGH (ref 0.00–0.07)
Basophils Absolute: 0 10*3/uL (ref 0.0–0.1)
Basophils Relative: 1 %
Eosinophils Absolute: 0.1 10*3/uL (ref 0.0–0.5)
Eosinophils Relative: 2 %
HCT: 24.3 % — ABNORMAL LOW (ref 39.0–52.0)
Hemoglobin: 7.6 g/dL — ABNORMAL LOW (ref 13.0–17.0)
Immature Granulocytes: 2 %
Lymphocytes Relative: 15 %
Lymphs Abs: 0.9 10*3/uL (ref 0.7–4.0)
MCH: 34.2 pg — ABNORMAL HIGH (ref 26.0–34.0)
MCHC: 31.3 g/dL (ref 30.0–36.0)
MCV: 109.5 fL — ABNORMAL HIGH (ref 80.0–100.0)
Monocytes Absolute: 0.7 10*3/uL (ref 0.1–1.0)
Monocytes Relative: 11 %
Neutro Abs: 4.2 10*3/uL (ref 1.7–7.7)
Neutrophils Relative %: 69 %
Platelet Count: 35 10*3/uL — ABNORMAL LOW (ref 150–400)
RBC: 2.22 MIL/uL — ABNORMAL LOW (ref 4.22–5.81)
RDW: 19.1 % — ABNORMAL HIGH (ref 11.5–15.5)
WBC Count: 6 10*3/uL (ref 4.0–10.5)
nRBC: 1.5 % — ABNORMAL HIGH (ref 0.0–0.2)

## 2023-08-24 LAB — SAMPLE TO BLOOD BANK

## 2023-08-27 ENCOUNTER — Telehealth: Payer: Self-pay | Admitting: Adult Health

## 2023-08-27 ENCOUNTER — Encounter: Payer: Self-pay | Admitting: Internal Medicine

## 2023-08-27 ENCOUNTER — Non-Acute Institutional Stay (SKILLED_NURSING_FACILITY): Payer: Medicare Other | Admitting: Internal Medicine

## 2023-08-27 ENCOUNTER — Other Ambulatory Visit (HOSPITAL_BASED_OUTPATIENT_CLINIC_OR_DEPARTMENT_OTHER): Payer: Self-pay

## 2023-08-27 DIAGNOSIS — R4189 Other symptoms and signs involving cognitive functions and awareness: Secondary | ICD-10-CM

## 2023-08-27 DIAGNOSIS — R0902 Hypoxemia: Secondary | ICD-10-CM | POA: Diagnosis not present

## 2023-08-27 DIAGNOSIS — D61818 Other pancytopenia: Secondary | ICD-10-CM

## 2023-08-27 DIAGNOSIS — I951 Orthostatic hypotension: Secondary | ICD-10-CM

## 2023-08-27 DIAGNOSIS — E039 Hypothyroidism, unspecified: Secondary | ICD-10-CM

## 2023-08-27 DIAGNOSIS — C9000 Multiple myeloma not having achieved remission: Secondary | ICD-10-CM

## 2023-08-27 NOTE — Progress Notes (Signed)
Location: [facility] Room Number: 153A Place of Service:  SNF [phone])  Provider:   Code Status: DNR Goals of Care:     08/27/2023    4:57 PM  Advanced Directives  Does Patient Have a Medical Advance Directive? Yes  Type of Advance Directive Out of facility DNR (pink MOST or yellow form)  Does patient want to make changes to medical advance directive? No - Patient declined  Would patient like information on creating a medical advance directive? No - Patient declined  Pre-existing out of facility DNR order (yellow form or pink MOST form) Yellow form placed in chart (order not valid for inpatient use)     Chief Complaint  Patient presents with   Acute Visit    Patient is being seen for an acute visit     HPI: Patient is a 72 y.o. male seen today for an acute visit for Episode over the weekend.   Patient has h/o Multiple Myeloma diagnosed in 2012 Post CART therapy per Dr Marissa Calamity on 01/24  In Clinical Remission On IVIG monthly per [facility] due to CART and Chronic Microvascular changes On Ritalin Recurent falls due to Unstable Gait Anemia with Occult positive stools Pancytopenia due to BM failure   Patient has had number of admissions in the hospital due to Anemia and hypertension in 06/14/23 Sepsis in 05/20/23 Pneumonia in 08/24  SIRS in 07/2023  Fever with Negative work up in 08/11/23  Over the weekend patient again became Hypoxic Had Episode of shaking With Low BP Episode of Vomiting Could not take his Midodrine due to Vomiting Confused Had one fall No Fever Wife decided to keep him in the Facility This morning he is alert no mOre vomiting Still Hypoxic But no cough or Chest pain  Some confused but other wise better but weak Needing assistance to walk   Past Medical History:  Diagnosis Date   Anemia    Arthritis    Asthma    no treatment x 20 years   Depression    Double vision    occurs at times    Duodenal ulcer     GERD (gastroesophageal reflux disease)    Hemorrhage of rectum and anus 09/15/2013   Hyperlipidemia    Hypertension    Hypothyroidism    Leukopenia due to antineoplastic chemotherapy (HCC) 02/22/2017   Multiple myeloma 07/04/2011   Thyroid disease    TIA (transient ischemic attack) 03/27/2012   Transient diplopia 03/27/2012   Transient global amnesia 06/07/2015    Past Surgical History:  Procedure Laterality Date   BONE MARROW TRANSPLANT  2011   for MM   CARDIOLITE STUDY  11/25/2003   NORMAL   TONSILLECTOMY      Allergies  Allergen Reactions   Atorvastatin Other (See Comments)    (LIPITOR) "Allergic," per [facility]   Rosuvastatin Other (See Comments)    (CRESTOR) Liver Disorder (adverse effects) and "Allergic," per [facility]   Beef-Derived Drug Products Other (See Comments)    pescatarian    Chicken Protein Other (See Comments)    pescatarian   Pork-Derived Products Other (See Comments)    pescatarian    Septra [Sulfamethoxazole-Trimethoprim] Rash and Other (See Comments)    "Allergic," per [facility]    Outpatient Encounter Medications as of 08/27/2023  Medication Sig   acetaminophen (TYLENOL) 650 MG suppository Place 650 mg rectally every 4 (four) hours as needed for fever (>100.6).   Calcium Carb-Cholecalciferol (CALCIUM + VITAMIN D3) 500-5 MG-MCG TABS  Take 1 tablet by mouth in the morning.   calcium carbonate (TUMS EX) 750 MG chewable tablet Chew 2 tablets by mouth 3 (three) times daily as needed (for indigestion).   Cholecalciferol (VITAMIN D3) 25 MCG (1000 UT) capsule Take 1,000 Units by mouth daily.   cyanocobalamin (VITAMIN B12) 1000 MCG tablet Take 1 tablet (1,000 mcg total) by mouth daily. (Patient taking differently: Take 1,000 mcg by mouth in the morning.)   dapsone 100 MG tablet Take 100 mg by mouth daily.   docusate sodium (COLACE) 100 MG capsule Take 100 mg by mouth daily as needed (for constipation).   escitalopram (LEXAPRO) 10 MG tablet Take 1 tablet (10 mg total) by mouth  daily.   ferrous sulfate 325 (65 FE) MG EC tablet Take 325 mg by mouth 2 (two) times daily.   folic acid (FOLVITE) 1 MG tablet Take 1 tablet (1 mg total) by mouth daily. (Patient taking differently: Take 1 mg by mouth in the morning.)   levothyroxine (SYNTHROID) 125 MCG tablet Take 125 mcg by mouth daily before breakfast.   Menthol-Methyl Salicylate (MUSCLE RUB) 10-15 % CREA Apply 1 Application topically as needed for muscle pain.   methylphenidate (RITALIN) 5 MG tablet Take 0.5 tablets (2.5 mg total) by mouth every morning.   midodrine (PROAMATINE) 2.5 MG tablet Take 1 tablet (2.5 mg total) by mouth 2 (two) times daily with a meal. (Patient taking differently: Take 2.5 mg by mouth 2 (two) times daily with a meal. Hold is SBP >140)   ondansetron (ZOFRAN-ODT) 4 MG disintegrating tablet Take 4 mg by mouth every 6 (six) hours as needed for nausea or vomiting.   pantoprazole (PROTONIX) 40 MG tablet Take 1 tablet (40 mg total) by mouth daily. (Patient taking differently: Take 40 mg by mouth daily with breakfast.)   Probiotic Product (UP4 PROBIOTICS WOMENS PO) Take 1 capsule by mouth daily. Hyperbiotics pro women wit D manos plus cranberry   tamsulosin (FLOMAX) 0.4 MG CAPS capsule Take 0.4 mg by mouth at bedtime.   traMADol (ULTRAM) 50 MG tablet Take 50 mg by mouth every 12 (twelve) hours as needed for moderate pain (pain score 4-6) or severe pain (pain score 7-10).   valACYclovir (VALTREX) 500 MG tablet Take 500 mg by mouth in the morning.   acetaminophen (TYLENOL) 500 MG tablet Take 2 tablets (1,000 mg total) by mouth every 8 (eight) hours. (Patient not taking: Reported on 08/27/2023)   [DISCONTINUED] prochlorperazine (COMPAZINE) 10 MG tablet Take 1 tablet (10 mg total) by mouth every 6 (six) hours as needed (Nausea or vomiting).   No facility-administered encounter medications on file as of 08/27/2023.    Review of Systems:  Review of Systems  Constitutional:  Positive for activity change. Negative  for appetite change and unexpected weight change.  HENT: Negative.    Respiratory:  Positive for shortness of breath. Negative for cough.   Cardiovascular:  Positive for leg swelling.  Gastrointestinal:  Negative for constipation.  Genitourinary:  Negative for frequency.  Musculoskeletal:  Positive for gait problem. Negative for arthralgias and myalgias.  Skin: Negative.  Negative for rash.  Neurological:  Positive for weakness. Negative for dizziness.  Psychiatric/Behavioral:  Positive for confusion. Negative for sleep disturbance.   All other systems reviewed and are negative.   Health Maintenance  Topic Date Due   Medicare Annual Wellness (AWV)  Never done   Pneumonia Vaccine 75+ Years old (3 of 3 - PPSV23 or PCV20) 06/16/2018   Colonoscopy  09/24/2023  Zoster Vaccines- Shingrix (1 of 2) 02/18/2024 (Originally 06/06/1970)   COVID-19 Vaccine (7 - 2023-24 season) 09/04/2023   DTaP/Tdap/Td (5 - Tdap) 03/06/2028   INFLUENZA VACCINE  Completed   HPV VACCINES  Aged Out   Hepatitis C Screening  Discontinued    Physical Exam: Vitals:   08/27/23 1653  BP: 101/61  Pulse: 70  Resp: 20  Temp: 98.2 F (36.8 C)  TempSrc: Temporal  SpO2: 91%  Weight: 173 lb 3.2 oz (78.6 kg)  Height: 5\' 7"  (1.702 m)   Body mass index is 27.13 kg/m. Physical Exam Vitals reviewed.  Constitutional:      Appearance: Normal appearance.  HENT:     Head: Normocephalic.     Nose: Nose normal.     Mouth/Throat:     Mouth: Mucous membranes are moist.     Pharynx: Oropharynx is clear.  Eyes:     Pupils: Pupils are equal, round, and reactive to light.  Cardiovascular:     Rate and Rhythm: Normal rate and regular rhythm.     Pulses: Normal pulses.     Heart sounds: No murmur heard. Pulmonary:     Effort: Pulmonary effort is normal. No respiratory distress.     Breath sounds: Normal breath sounds. No rales.     Comments: Few rales in the Base of the lungs Abdominal:     General: Abdomen is flat.  Bowel sounds are normal.     Palpations: Abdomen is soft.  Musculoskeletal:        General: Swelling present.     Cervical back: Neck supple.  Skin:    General: Skin is warm.  Neurological:     General: No focal deficit present.     Mental Status: He is alert.  Psychiatric:        Mood and Affect: Mood normal.        Thought Content: Thought content normal.     Labs reviewed: Basic Metabolic Panel: Recent Labs    05/20/23 2320 05/22/23 0420 05/23/23 0413 06/06/23 0849 06/16/23 0536 06/17/23 0517 06/27/23 0933 08/02/23 0825 08/09/23 0000 08/11/23 1118 08/15/23 0409 08/16/23 0402 08/17/23 0333  NA 131*   < > 134*   < > 137   < > 139   < >  --    < > 137 132* 137  K 3.7   < > 3.7   < > 3.9   < > 3.7   < >  --    < > 3.5 3.3* 3.6  CL 98   < > 101   < > 104   < > 105   < >  --    < > 102 98 104  CO2 23   < > 25   < > 25   < > 25   < >  --    < > 28 27 26   GLUCOSE 120*   < > 97   < > 92   < > 114*   < >  --    < > 94 94 103*  BUN 14   < > 21   < > 17   < > 16   < >  --    < > 13 15 14   CREATININE 1.00   < > 0.91   < > 0.92   < > 0.93   < >  --    < > 0.72 0.82 0.81  CALCIUM 8.9   < > 8.9   < >  8.8*   < > 9.9   < >  --    < > 9.3 8.8* 8.7*  MG 1.9   < > 2.1  --  2.3   < > 1.9  --   --    < > 2.0 2.2 2.2  PHOS  --    < > 2.4*  --  3.7  --  2.8  --   --   --   --   --   --   TSH 0.173*  --   --   --   --   --   --   --  6.02*  --   --   --   --    < > = values in this interval not displayed.   Liver Function Tests: Recent Labs    08/04/23 1115 08/05/23 0532 08/11/23 1118  AST 24 20 19   ALT 18 16 10   ALKPHOS 74 71 64  BILITOT 0.6 0.7 0.5  PROT 6.9 6.8 6.2*  ALBUMIN 3.4* 3.3* 3.3*   Recent Labs    10/29/22 2042 05/05/23 1901 05/20/23 1643  LIPASE 57* 30 26   Recent Labs    05/20/23 1825  AMMONIA 26   CBC: Recent Labs    08/16/23 0402 08/17/23 0333 08/24/23 1051  WBC 3.3* 3.7* 6.0  NEUTROABS 2.5 2.9 4.2  HGB 7.3* 7.3* 7.6*  HCT 22.6* 22.7* 24.3*   MCV 111.9* 112.9* 109.5*  PLT 16* 16* 35*   Lipid Panel: Recent Labs    02/13/23 0000  CHOL 178  HDL 42  LDLCALC 113  TRIG 130*   Lab Results  Component Value Date   HGBA1C 4.3 (L) 05/06/2023    Procedures since last visit: DG CHEST PORT 1 VIEW  Result Date: 08/15/2023 CLINICAL DATA:  Hypoxia EXAM: PORTABLE CHEST 1 VIEW COMPARISON:  Chest radiograph dated 08/11/2023 FINDINGS: Unchanged asymmetric elevation of the right hemidiaphragm. Normal lung volumes. Left basilar patchy and dense retrocardiac opacities. No pleural effusion or pneumothorax. The heart size and mediastinal contours are within normal limits. Similar appearance of multiple bilateral rib fractures. IMPRESSION: Left basilar patchy and dense retrocardiac opacities, which may represent combination of atelectasis and known hiatal hernia. Aspiration or pneumonia can be considered in the appropriate clinical setting. Electronically Signed   By: Agustin Cree M.D.   On: 08/15/2023 14:16   CT HEAD WO CONTRAST ( )  Result Date: 08/11/2023 CLINICAL DATA:  Unwitnessed fall EXAM: CT HEAD WITHOUT CONTRAST TECHNIQUE: Contiguous axial images were obtained from the base of the skull through the vertex without intravenous contrast. RADIATION DOSE REDUCTION: This exam was performed according to the departmental dose-optimization program which includes automated exposure control, adjustment of the mA and/or kV according to patient size and/or use of iterative reconstruction technique. COMPARISON:  05/23/2023 FINDINGS: Brain: No evidence of acute infarction, hemorrhage, mass, mass effect, or midline shift. No hydrocephalus or extra-axial fluid collection. Periventricular white matter changes, likely the sequela of chronic small vessel ischemic disease. Redemonstrated remote left cerebellar infarct. Vascular: No hyperdense vessel. Skull: Negative for fracture. Redemonstrated lucencies in the calvarium, consistent with the patient's history of  multiple myeloma. Sinuses/Orbits: Mucosal thickening in the ethmoid air cells. No acute finding in the orbits. Other: The mastoid air cells are well aerated. IMPRESSION: No acute intracranial process. Electronically Signed   By: Wiliam Ke M.D.   On: 08/11/2023 22:20   CT Angio Chest PE W/Cm &/Or Wo Cm  Result Date: 08/11/2023 CLINICAL DATA:  72 year old male with fever and body ache. Undergoing chemotherapy for multiple myeloma. EXAM: CT ANGIOGRAPHY CHEST WITH CONTRAST TECHNIQUE: Multidetector CT imaging of the chest was performed using the standard protocol during bolus administration of intravenous contrast. Multiplanar CT image reconstructions and MIPs were obtained to evaluate the vascular anatomy. RADIATION DOSE REDUCTION: This exam was performed according to the departmental dose-optimization program which includes automated exposure control, adjustment of the mA and/or kV according to patient size and/or use of iterative reconstruction technique. CONTRAST:  75mL OMNIPAQUE IOHEXOL 350 MG/ML SOLN COMPARISON:  Portable chest 1123 hours today. Previous chest CTA 05/20/2023. Recent chest CT 08/05/2023. FINDINGS: Cardiovascular: Good contrast bolus timing in the pulmonary arterial tree. No pulmonary artery filling defect identified. Calcified aortic atherosclerosis. Calcified coronary artery atherosclerosis. Heart size within normal limits. No pericardial effusion. Mediastinum/Nodes: Moderate to large gastric hiatal hernia. No mediastinal lymphadenopathy. Lungs/Pleura: Low lung volumes superimposed on chronic gastric hiatal hernia and elevated right hemidiaphragm. Confluent bilateral lower lobe atelectasis. Central airways remain patent. No convincing acute pneumonia, acute lung inflammation. Upper Abdomen: Stable, negative visible mostly noncontrast upper abdominal viscera. Musculoskeletal: Generalized bone mineralization heterogeneity of multiple myeloma. Chronic T12 compression fracture. Numerous  chronic rib fractures. Un healed pathologic fractures of the lateral left 7th and 8th ribs, similar to recent chest CT. Unhealed posterior left 9th rib fracture is mildly more displaced from the recent CT. No brand new osseous abnormality is identified. Review of the MIP images confirms the above findings. IMPRESSION: 1. Negative for acute pulmonary embolus. 2. Low lung volumes with confluent atelectasis, in part due to chronic hiatal hernia on the left. No pleural effusion or convincing pneumonia. 3. Multiple myeloma. Un-healed pathologic fractures of the left ribs 7 through 9, slightly more displaced since 08/05/2023. 4. Aortic Atherosclerosis (ICD10-I70.0). Coronary artery atherosclerosis. Electronically Signed   By: Odessa Fleming M.D.   On: 08/11/2023 12:55   DG Chest Port 1 View  Result Date: 08/11/2023 CLINICAL DATA:  Hypoxia and fever.  History of multiple myeloma. EXAM: PORTABLE CHEST 1 VIEW COMPARISON:  08/04/2023 FINDINGS: Stable cardiomediastinal contours. Unchanged asymmetric elevation of the right hemidiaphragm. No pleural effusion, airspace consolidation, or pneumothorax. No signs of interstitial edema. Multiple remote healed posterior and lateral right rib fractures, no acute osseous findings noted. IMPRESSION: No acute cardiopulmonary abnormalities. Unchanged chronic asymmetric  elevation of the right hemidiaphragm. Electronically Signed   By: Signa Kell M.D.   On: 08/11/2023 11:36   MR TMJ  Result Date: 08/06/2023 CLINICAL DATA:  Jaw pain for 2 months EXAM: MRI OF TEMPOROMANDIBULAR JOINT WITHOUT CONTRAST TECHNIQUE: Multiplanar, multisequence MR imaging of the temporomandibular joint was performed following the standard protocol. No intravenous contrast was administered. Sequential bite blocks of 4 mm, 8 mm, 12 mm, 16 mm, and 20 mm were used for open mouth sequences. COMPARISON:  CT 05/23/2023, MRI 05/21/2023 FINDINGS: Technical Note: Despite efforts by the technologist and patient, motion  artifact is present on today's exam and could not be eliminated. This reduces exam sensitivity and specificity. Right temporomandibular joint: Moderate osteoarthritis of the right temporomandibular joint with mild flattening of the mandibular condyle. Diminutive appearance of the articular disc. Disc does remain positioned between the mandibular condyle and temporal bone in both open and closed mouth positions. There is normal anterior translation of the mandibular condyle with jaw opening. No erosions or joint effusion. Left temporomandibular joint: Moderate osteoarthritis of the left temporomandibular joint with mild flattening of the mandibular condyle. Diminutive appearance of the articular disc. Disc does remain  positioned between the mandibular condyle and temporal bone in both open and closed mouth positions. There is normal anterior translation of the mandibular condyle with jaw opening. No erosions or joint effusion. Other: None. IMPRESSION: 1. 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 2. Diminutive appearance of the bilateral articular discs. Discs do remain positioned between the mandibular condyles and temporal bones in both open and closed mouth positions. Electronically Signed   By: Duanne Guess D.O.   On: 08/06/2023 15:56   CT CHEST WO CONTRAST  Result Date: 08/05/2023 CLINICAL DATA:  Respiratory illness. EXAM: CT CHEST WITHOUT CONTRAST TECHNIQUE: Multidetector CT imaging of the chest was performed following the standard protocol without IV contrast. RADIATION DOSE REDUCTION: This exam was performed according to the departmental dose-optimization program which includes automated exposure control, adjustment of the mA and/or kV according to patient size and/or use of iterative reconstruction technique. COMPARISON:  May 20, 2023 FINDINGS: Cardiovascular: Calcific atherosclerotic disease of the coronary arteries. Normal heart size. No pericardial effusion.  Mediastinum/Nodes: No enlarged mediastinal or axillary lymph nodes. Thyroid gland, trachea, and esophagus demonstrate no significant findings. Lungs/Pleura: Streaky airspace opacities in the right more the left lung base with mild pleural thickening. These findings are not significantly changed from September 2024. Upper Abdomen: Cholelithiasis. Large hiatal hernia containing most of the gastric body. Musculoskeletal: Healed bilateral rib fractures. Chronic mild T12 compression fracture. IMPRESSION: 1. Streaky airspace opacities in the right more the left lung base with mild pleural thickening. These findings are not significantly changed from September 2024. 2. Calcific atherosclerotic disease of the coronary arteries. 3. Cholelithiasis. 4. Large hiatal hernia containing most of the gastric body. 5. Chronic mild T12 compression fracture. 6. Healed bilateral rib fractures. 7. Aortic atherosclerosis. Aortic Atherosclerosis (ICD10-I70.0). Electronically Signed   By: Ted Mcalpine M.D.   On: 08/05/2023 19:36   DG Chest Port 1 View  Result Date: 08/04/2023 CLINICAL DATA:  History of multiple myeloma with sepsis EXAM: PORTABLE CHEST 1 VIEW COMPARISON:  Chest radiograph dated 06/20/2023 FINDINGS: Unchanged elevation of the right hemidiaphragm. Normal lung volumes. Heterogeneous left retrocardiac opacity. No pleural effusion or pneumothorax. The heart size and mediastinal contours are within normal limits. No acute osseous abnormality. IMPRESSION: 1. Heterogeneous left retrocardiac opacity, likely reflecting known hernia. 2.  No new focal consolidations. Electronically Signed   By: Agustin Cree M.D.   On: 08/04/2023 12:01    Assessment/Plan 1. Hypoxia weakness and Confusion Discussed with the wife again today.   She is still not open to hospice.   Wanted to talk to Dr. Truett Perna  before making any decision.   She agreed that we will just continue to watch [facility] in [facility].   Patient does not have any signs  of infection.   he did have a very detailed workup few weeks ago and infection was ruled out for his hypoxia  2. Pancytopenia (HCC) Bone mArrow Failure after CART therapy  He has stopped Nplate infusions as they were not helping her platelets and to see if it helps with her fever spikes  Transfuse Platelets PRN if Platelets less then 10000  3. Orthostatic hypotension Continue low-dose of midodrine hold if systolic blood pressure more than 140 Patient has had fluctuating blood pressure and this is to see if it will help prevent his falls    4. Cognitive impairment Due to Vascular Disease and CART therapy  Continues to stay in SNF  5. Acquired hypothyroidism TSH 6.02 in 11/24  6. Multiple myeloma, remission status  unspecified (HCC)  Dapsone and Valtrex for Prophylaxis  So far in Remission   Labs/tests ordered:   Next appt:  Visit date not found Total time spent in this patient care encounter was  45_  minutes; greater than 50% of the visit spent counseling patient wife  and staff, reviewing records , Labs and coordinating care for problems addressed at this encounter.

## 2023-08-27 NOTE — Telephone Encounter (Signed)
Late entry: Received phone call to oncall service on 08/26/23 from [facility] Nurse [employer]. She reported that Andre [name] vomited x 1 and felt weak. Sats were 83% on 3 liters of 02 (he has been 02 dependent with hx of atx and multiple myeloma). He did not have a fever or sob. He recently was discharged from the hospital for fever of uncertain origin. Goal is to try to avoid the hospital. Hospice was offered but declined. Recommend increasing his oxygen to 4 liters and encouraging fluids. If no improvement he should go to the ED unless his POA would like to reconsider comfort care.

## 2023-08-28 ENCOUNTER — Non-Acute Institutional Stay (SKILLED_NURSING_FACILITY): Payer: Self-pay | Admitting: Internal Medicine

## 2023-08-28 ENCOUNTER — Encounter: Payer: Self-pay | Admitting: Internal Medicine

## 2023-08-28 DIAGNOSIS — Z7189 Other specified counseling: Secondary | ICD-10-CM

## 2023-08-28 DIAGNOSIS — R4189 Other symptoms and signs involving cognitive functions and awareness: Secondary | ICD-10-CM

## 2023-08-28 DIAGNOSIS — D61818 Other pancytopenia: Secondary | ICD-10-CM | POA: Diagnosis not present

## 2023-08-28 DIAGNOSIS — R0902 Hypoxemia: Secondary | ICD-10-CM

## 2023-08-28 DIAGNOSIS — I951 Orthostatic hypotension: Secondary | ICD-10-CM | POA: Diagnosis not present

## 2023-08-28 DIAGNOSIS — A689 Relapsing fever, unspecified: Secondary | ICD-10-CM

## 2023-08-28 DIAGNOSIS — C9000 Multiple myeloma not having achieved remission: Secondary | ICD-10-CM

## 2023-08-28 NOTE — Progress Notes (Signed)
Location:  [facility] Room Number: 153A Place of Service:  SNF [phone]) Provider:  [name], MD   [name], MD  Patient Care Team: [name], MD as PCP - General (Internal Medicine) Shirlean Schlein, MD as Consulting Physician (Neurology) Eddie Candle, MD as Consulting Physician (Internal Medicine) York Spaniel, MD (Inactive) as Consulting Physician (Neurology) Sharlett Iles, DDS as Referring Physician (Oral Surgery) Ladene Artist, MD as Consulting Physician (Oncology) Fletcher Anon, NP as Nurse Practitioner (Nurse Practitioner)  Extended Emergency Contact Information Primary Emergency Contact: Paulick,[name] Address: [address]          [place], [place] [address] [name] of [place] Home Phone: [phone] Relation: Spouse  Code Status:  DNR Goals of care: Advanced Directive information    08/28/2023    4:31 PM  Advanced Directives  Does Patient Have a Medical Advance Directive? Yes  Type of Advance Directive Out of facility DNR (pink MOST or yellow form)  Does patient want to make changes to medical advance directive? No - Patient declined  Would patient like information on creating a medical advance directive? No - Patient declined  Pre-existing out of facility DNR order (yellow form or pink MOST form) Yellow form placed in chart (order not valid for inpatient use)     Chief Complaint  Patient presents with   Acute Visit    HPI:  Pt is a 72 y.o. male seen today for an acute visit for family meeting  He is in Rehab in [place]  Patient has h/o Multiple Myeloma diagnosed in 2012 Post CART therapy per Dr Marissa Calamity on 01/24  In Clinical Remission On IVIG monthly per [facility] due to CART and Chronic Microvascular changes On Ritalin Recurent falls due to Unstable Gait Anemia with Occult positive stools Pancytopenia due to BM failure   Patient has had number of admissions in the  hospital due to Anemia and hypertension in 06/14/23 Sepsis in 05/20/23 Pneumonia in 08/24  SIRS in 07/2023  Fever with Negative work up in 08/11/23   Over the weekend patient again became Hypoxic Had Episode of shaking With Low BP Episode of Vomiting Could not take his Midodrine due to Vomiting Confused Had one fall No Fever Wife decided to keep him in the Facility  This morning patient continues to be hypoxic.  But otherwise no vomiting.  Blood pressure is soft systolic blood pressure runs around 98.  He continues to be weak and needs help walking.  Is using mostly wheelchair to get around. Patient is wife and his daughters are here and want to talk about goals of care. Past Medical History:  Diagnosis Date   Anemia    Arthritis    Asthma    no treatment x 20 years   Depression    Double vision    occurs at times    Duodenal ulcer    GERD (gastroesophageal reflux disease)    Hemorrhage of rectum and anus 09/15/2013   Hyperlipidemia    Hypertension    Hypothyroidism    Leukopenia due to antineoplastic chemotherapy (HCC) 02/22/2017   Multiple myeloma 07/04/2011   Thyroid disease    TIA (transient ischemic attack) 03/27/2012   Transient diplopia 03/27/2012   Transient global amnesia 06/07/2015   Past Surgical History:  Procedure Laterality Date   BONE MARROW TRANSPLANT  2011   for MM   CARDIOLITE STUDY  11/25/2003   NORMAL   TONSILLECTOMY  Allergies  Allergen Reactions   Atorvastatin Other (See Comments)    (LIPITOR) "Allergic," per [facility]   Rosuvastatin Other (See Comments)    (CRESTOR) Liver Disorder (adverse effects) and "Allergic," per [facility]   Beef-Derived Drug Products Other (See Comments)    pescatarian    Chicken Protein Other (See Comments)    pescatarian   Pork-Derived Products Other (See Comments)    pescatarian    Septra [Sulfamethoxazole-Trimethoprim] Rash and Other (See Comments)    "Allergic," per [name]    Outpatient Encounter Medications as of  08/28/2023  Medication Sig   acetaminophen (TYLENOL) 500 MG tablet Take 2 tablets (1,000 mg total) by mouth every 8 (eight) hours.   acetaminophen (TYLENOL) 650 MG suppository Place 650 mg rectally every 4 (four) hours as needed for fever (>100.6).   Calcium Carb-Cholecalciferol (CALCIUM + VITAMIN D3) 500-5 MG-MCG TABS Take 1 tablet by mouth in the morning.   calcium carbonate (TUMS EX) 750 MG chewable tablet Chew 2 tablets by mouth 3 (three) times daily as needed (for indigestion).   Cholecalciferol (VITAMIN D3) 25 MCG (1000 UT) capsule Take 1,000 Units by mouth daily.   cyanocobalamin (VITAMIN B12) 1000 MCG tablet Take 1 tablet (1,000 mcg total) by mouth daily. (Patient taking differently: Take 1,000 mcg by mouth in the morning.)   dapsone 100 MG tablet Take 100 mg by mouth daily.   docusate sodium (COLACE) 100 MG capsule Take 100 mg by mouth daily as needed (for constipation).   escitalopram (LEXAPRO) 10 MG tablet Take 1 tablet (10 mg total) by mouth daily.   ferrous sulfate 325 (65 FE) MG EC tablet Take 325 mg by mouth 2 (two) times daily.   folic acid (FOLVITE) 1 MG tablet Take 1 tablet (1 mg total) by mouth daily. (Patient taking differently: Take 1 mg by mouth in the morning.)   levothyroxine (SYNTHROID) 125 MCG tablet Take 125 mcg by mouth daily before breakfast.   Menthol-Methyl Salicylate (MUSCLE RUB) 10-15 % CREA Apply 1 Application topically as needed for muscle pain.   methylphenidate (RITALIN) 5 MG tablet Take 0.5 tablets (2.5 mg total) by mouth every morning.   midodrine (PROAMATINE) 2.5 MG tablet Take 1 tablet (2.5 mg total) by mouth 2 (two) times daily with a meal. (Patient taking differently: Take 2.5 mg by mouth 2 (two) times daily with a meal. Hold is SBP >140)   ondansetron (ZOFRAN-ODT) 4 MG disintegrating tablet Take 4 mg by mouth every 6 (six) hours as needed for nausea or vomiting.   pantoprazole (PROTONIX) 40 MG tablet Take 1 tablet (40 mg total) by mouth daily. (Patient  taking differently: Take 40 mg by mouth daily with breakfast.)   Probiotic Product (UP4 PROBIOTICS WOMENS PO) Take 1 capsule by mouth daily. Hyperbiotics pro women wit D manos plus cranberry   tamsulosin (FLOMAX) 0.4 MG CAPS capsule Take 0.4 mg by mouth at bedtime.   traMADol (ULTRAM) 50 MG tablet Take 50 mg by mouth every 12 (twelve) hours as needed for moderate pain (pain score 4-6) or severe pain (pain score 7-10).   valACYclovir (VALTREX) 500 MG tablet Take 500 mg by mouth in the morning.   [DISCONTINUED] prochlorperazine (COMPAZINE) 10 MG tablet Take 1 tablet (10 mg total) by mouth every 6 (six) hours as needed (Nausea or vomiting).   No facility-administered encounter medications on file as of 08/28/2023.    Review of Systems  Constitutional:  Positive for activity change. Negative for appetite change and unexpected weight change.  HENT:  Negative.    Respiratory:  Positive for shortness of breath. Negative for cough.   Cardiovascular:  Positive for leg swelling.  Gastrointestinal:  Negative for constipation.  Genitourinary:  Negative for frequency.  Musculoskeletal:  Positive for gait problem. Negative for arthralgias and myalgias.  Skin: Negative.  Negative for rash.  Neurological:  Positive for weakness. Negative for dizziness.  Psychiatric/Behavioral:  Positive for confusion. Negative for sleep disturbance.   All other systems reviewed and are negative.   Immunization History  Administered Date(s) Administered   DTaP / Hep B / IPV 05/04/2017   DTaP / HiB / IPV 03/06/2018   Fluad Quad(high Dose 65+) 06/09/2019, 06/25/2020, 07/25/2021, 05/30/2022   Fluad Trivalent(High Dose 65+) 08/04/2012, 07/03/2014, 06/16/2023   HIB (PRP-T) 05/04/2017   Hepatitis B, ADULT 03/14/2017, 03/06/2018   IPV 01/28/1998   Influenza, High Dose Seasonal PF 06/16/2018   Influenza,inj,Quad PF,6+ Mos 06/16/2013, 07/03/2014, 06/11/2015, 05/31/2017   Influenza-Unspecified 07/29/2012   Moderna Covid-19  Vaccine Bivalent Booster 70yrs & up 07/10/2023   PFIZER(Purple Top)SARS-COV-2 Vaccination 10/26/2019, 11/20/2019, 07/22/2020   Pfizer Covid-19 Vaccine Bivalent Booster 3yrs & up 06/27/2021   Pfizer(Comirnaty)Fall Seasonal Vaccine 12 years and older 07/05/2022   Pneumococcal Conjugate-13 10/15/2013, 05/04/2017, 03/06/2018   Pneumococcal Polysaccharide-23 06/16/2013   Respiratory Syncytial Virus Vaccine,Recomb Aduvanted(Arexvy) 05/30/2022   Td 02/10/1998, 07/03/2014   Pertinent  Health Maintenance Due  Topic Date Due   Colonoscopy  09/24/2023   INFLUENZA VACCINE  Completed      08/30/2022   11:01 AM 09/06/2022   10:00 AM 02/15/2023    1:29 PM 06/11/2023   11:20 AM 06/27/2023   10:37 AM  Fall Risk  Falls in the past year?   1 0 1  Was there an injury with Fall?   1 0 1  Fall Risk Category Calculator   3 0 3  (RETIRED) Patient Fall Risk Level Low fall risk Low fall risk     Patient at Risk for Falls Due to    History of fall(s) History of fall(s);Impaired balance/gait;Impaired mobility  Fall risk Follow up   Falls evaluation completed Falls evaluation completed Falls evaluation completed;Education provided;Falls prevention discussed   Functional Status Survey:    Vitals:   08/28/23 1317  BP: 110/64  Pulse: 80  Resp: 16  Temp: 98.6 F (37 C)  TempSrc: Temporal  SpO2: 91%  Weight: 173 lb 12.8 oz (78.8 kg)  Height: 5\' 7"  (1.702 m)   Body mass index is 27.22 kg/m. Physical Exam Vitals reviewed.  Constitutional:      Appearance: Normal appearance.  HENT:     Head: Normocephalic.     Nose: Nose normal.     Mouth/Throat:     Mouth: Mucous membranes are moist.     Pharynx: Oropharynx is clear.  Eyes:     Pupils: Pupils are equal, round, and reactive to light.  Cardiovascular:     Rate and Rhythm: Normal rate and regular rhythm.     Pulses: Normal pulses.     Heart sounds: No murmur heard. Pulmonary:     Effort: Pulmonary effort is normal. No respiratory distress.      Breath sounds: Normal breath sounds. No rales.  Abdominal:     General: Abdomen is flat. Bowel sounds are normal.     Palpations: Abdomen is soft.  Musculoskeletal:        General: Swelling present.     Cervical back: Neck supple.  Skin:    General: Skin is warm.  Neurological:  General: No focal deficit present.     Mental Status: He is alert.  Psychiatric:        Mood and Affect: Mood normal.        Thought Content: Thought content normal.    Labs reviewed: Recent Labs    05/23/23 0413 06/06/23 0849 06/16/23 0536 06/17/23 0517 06/27/23 0933 08/02/23 0825 08/15/23 0409 08/16/23 0402 08/17/23 0333  NA 134*   < > 137   < > 139   < > 137 132* 137  K 3.7   < > 3.9   < > 3.7   < > 3.5 3.3* 3.6  CL 101   < > 104   < > 105   < > 102 98 104  CO2 25   < > 25   < > 25   < > 28 27 26   GLUCOSE 97   < > 92   < > 114*   < > 94 94 103*  BUN 21   < > 17   < > 16   < > 13 15 14   CREATININE 0.91   < > 0.92   < > 0.93   < > 0.72 0.82 0.81  CALCIUM 8.9   < > 8.8*   < > 9.9   < > 9.3 8.8* 8.7*  MG 2.1  --  2.3   < > 1.9   < > 2.0 2.2 2.2  PHOS 2.4*  --  3.7  --  2.8  --   --   --   --    < > = values in this interval not displayed.   Recent Labs    08/04/23 1115 08/05/23 0532 08/11/23 1118  AST 24 20 19   ALT 18 16 10   ALKPHOS 74 71 64  BILITOT 0.6 0.7 0.5  PROT 6.9 6.8 6.2*  ALBUMIN 3.4* 3.3* 3.3*   Recent Labs    08/16/23 0402 08/17/23 0333 08/24/23 1051  WBC 3.3* 3.7* 6.0  NEUTROABS 2.5 2.9 4.2  HGB 7.3* 7.3* 7.6*  HCT 22.6* 22.7* 24.3*  MCV 111.9* 112.9* 109.5*  PLT 16* 16* 35*   Lab Results  Component Value Date   TSH 6.02 (A) 08/09/2023   Lab Results  Component Value Date   HGBA1C 4.3 (L) 05/06/2023   Lab Results  Component Value Date   CHOL 178 02/13/2023   HDL 42 02/13/2023   LDLCALC 113 02/13/2023   LDLDIRECT 155.8 11/13/2012   TRIG 167 (A) 02/13/2023   CHOLHDL 4 09/06/2017    Significant Diagnostic Results in last 30 days:  DG CHEST PORT 1  VIEW  Result Date: 08/15/2023 CLINICAL DATA:  Hypoxia EXAM: PORTABLE CHEST 1 VIEW COMPARISON:  Chest radiograph dated 08/11/2023 FINDINGS: Unchanged asymmetric elevation of the right hemidiaphragm. Normal lung volumes. Left basilar patchy and dense retrocardiac opacities. No pleural effusion or pneumothorax. The heart size and mediastinal contours are within normal limits. Similar appearance of multiple bilateral rib fractures. IMPRESSION: Left basilar patchy and dense retrocardiac opacities, which may represent combination of atelectasis and known hiatal hernia. Aspiration or pneumonia can be considered in the appropriate clinical setting. Electronically Signed   By: Agustin Cree M.D.   On: 08/15/2023 14:16   CT HEAD WO CONTRAST ( )  Result Date: 08/11/2023 CLINICAL DATA:  Unwitnessed fall EXAM: CT HEAD WITHOUT CONTRAST TECHNIQUE: Contiguous axial images were obtained from the base of the skull through the vertex without intravenous contrast. RADIATION DOSE REDUCTION: This exam was performed according  to the departmental dose-optimization program which includes automated exposure control, adjustment of the mA and/or kV according to patient size and/or use of iterative reconstruction technique. COMPARISON:  05/23/2023 FINDINGS: Brain: No evidence of acute infarction, hemorrhage, mass, mass effect, or midline shift. No hydrocephalus or extra-axial fluid collection. Periventricular white matter changes, likely the sequela of chronic small vessel ischemic disease. Redemonstrated remote left cerebellar infarct. Vascular: No hyperdense vessel. Skull: Negative for fracture. Redemonstrated lucencies in the calvarium, consistent with the patient's history of multiple myeloma. Sinuses/Orbits: Mucosal thickening in the ethmoid air cells. No acute finding in the orbits. Other: The mastoid air cells are well aerated. IMPRESSION: No acute intracranial process. Electronically Signed   By: Wiliam Ke M.D.   On:  08/11/2023 22:20   CT Angio Chest PE W/Cm &/Or Wo Cm  Result Date: 08/11/2023 CLINICAL DATA:  72 year old male with fever and body ache. Undergoing chemotherapy for multiple myeloma. EXAM: CT ANGIOGRAPHY CHEST WITH CONTRAST TECHNIQUE: Multidetector CT imaging of the chest was performed using the standard protocol during bolus administration of intravenous contrast. Multiplanar CT image reconstructions and MIPs were obtained to evaluate the vascular anatomy. RADIATION DOSE REDUCTION: This exam was performed according to the departmental dose-optimization program which includes automated exposure control, adjustment of the mA and/or kV according to patient size and/or use of iterative reconstruction technique. CONTRAST:  75mL OMNIPAQUE IOHEXOL 350 MG/ML SOLN COMPARISON:  Portable chest 1123 hours today. Previous chest CTA 05/20/2023. Recent chest CT 08/05/2023. FINDINGS: Cardiovascular: Good contrast bolus timing in the pulmonary arterial tree. No pulmonary artery filling defect identified. Calcified aortic atherosclerosis. Calcified coronary artery atherosclerosis. Heart size within normal limits. No pericardial effusion. Mediastinum/Nodes: Moderate to large gastric hiatal hernia. No mediastinal lymphadenopathy. Lungs/Pleura: Low lung volumes superimposed on chronic gastric hiatal hernia and elevated right hemidiaphragm. Confluent bilateral lower lobe atelectasis. Central airways remain patent. No convincing acute pneumonia, acute lung inflammation. Upper Abdomen: Stable, negative visible mostly noncontrast upper abdominal viscera. Musculoskeletal: Generalized bone mineralization heterogeneity of multiple myeloma. Chronic T12 compression fracture. Numerous chronic rib fractures. Un healed pathologic fractures of the lateral left 7th and 8th ribs, similar to recent chest CT. Unhealed posterior left 9th rib fracture is mildly more displaced from the recent CT. No brand new osseous abnormality is identified. Review  of the MIP images confirms the above findings. IMPRESSION: 1. Negative for acute pulmonary embolus. 2. Low lung volumes with confluent atelectasis, in part due to chronic hiatal hernia on the left. No pleural effusion or convincing pneumonia. 3. Multiple myeloma. Un-healed pathologic fractures of the left ribs 7 through 9, slightly more displaced since 08/05/2023. 4. Aortic Atherosclerosis (ICD10-I70.0). Coronary artery atherosclerosis. Electronically Signed   By: Odessa Fleming M.D.   On: 08/11/2023 12:55   DG Chest Port 1 View  Result Date: 08/11/2023 CLINICAL DATA:  Hypoxia and fever.  History of multiple myeloma. EXAM: PORTABLE CHEST 1 VIEW COMPARISON:  08/04/2023 FINDINGS: Stable cardiomediastinal contours. Unchanged asymmetric elevation of the right hemidiaphragm. No pleural effusion, airspace consolidation, or pneumothorax. No signs of interstitial edema. Multiple remote healed posterior and lateral right rib fractures, no acute osseous findings noted. IMPRESSION: No acute cardiopulmonary abnormalities. Unchanged chronic asymmetric  elevation of the right hemidiaphragm. Electronically Signed   By: Signa Kell M.D.   On: 08/11/2023 11:36   MR TMJ  Result Date: 08/06/2023 CLINICAL DATA:  Jaw pain for 2 months EXAM: MRI OF TEMPOROMANDIBULAR JOINT WITHOUT CONTRAST TECHNIQUE: Multiplanar, multisequence MR imaging of the temporomandibular joint was performed following the  standard protocol. No intravenous contrast was administered. Sequential bite blocks of 4 mm, 8 mm, 12 mm, 16 mm, and 20 mm were used for open mouth sequences. COMPARISON:  CT 05/23/2023, MRI 05/21/2023 FINDINGS: Technical Note: Despite efforts by the technologist and patient, motion artifact is present on today's exam and could not be eliminated. This reduces exam sensitivity and specificity. Right temporomandibular joint: Moderate osteoarthritis of the right temporomandibular joint with mild flattening of the mandibular condyle. Diminutive  appearance of the articular disc. Disc does remain positioned between the mandibular condyle and temporal bone in both open and closed mouth positions. There is normal anterior translation of the mandibular condyle with jaw opening. No erosions or joint effusion. Left temporomandibular joint: Moderate osteoarthritis of the left temporomandibular joint with mild flattening of the mandibular condyle. Diminutive appearance of the articular disc. Disc does remain positioned between the mandibular condyle and temporal bone in both open and closed mouth positions. There is normal anterior translation of the mandibular condyle with jaw opening. No erosions or joint effusion. Other: None. IMPRESSION: 1. 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 2. Diminutive appearance of the bilateral articular discs. Discs do remain positioned between the mandibular condyles and temporal bones in both open and closed mouth positions. Electronically Signed   By: Duanne Guess D.O.   On: 08/06/2023 15:56   CT CHEST WO CONTRAST  Result Date: 08/05/2023 CLINICAL DATA:  Respiratory illness. EXAM: CT CHEST WITHOUT CONTRAST TECHNIQUE: Multidetector CT imaging of the chest was performed following the standard protocol without IV contrast. RADIATION DOSE REDUCTION: This exam was performed according to the departmental dose-optimization program which includes automated exposure control, adjustment of the mA and/or kV according to patient size and/or use of iterative reconstruction technique. COMPARISON:  May 20, 2023 FINDINGS: Cardiovascular: Calcific atherosclerotic disease of the coronary arteries. Normal heart size. No pericardial effusion. Mediastinum/Nodes: No enlarged mediastinal or axillary lymph nodes. Thyroid gland, trachea, and esophagus demonstrate no significant findings. Lungs/Pleura: Streaky airspace opacities in the right more the left lung base with mild pleural thickening. These findings are  not significantly changed from September 2024. Upper Abdomen: Cholelithiasis. Large hiatal hernia containing most of the gastric body. Musculoskeletal: Healed bilateral rib fractures. Chronic mild T12 compression fracture. IMPRESSION: 1. Streaky airspace opacities in the right more the left lung base with mild pleural thickening. These findings are not significantly changed from September 2024. 2. Calcific atherosclerotic disease of the coronary arteries. 3. Cholelithiasis. 4. Large hiatal hernia containing most of the gastric body. 5. Chronic mild T12 compression fracture. 6. Healed bilateral rib fractures. 7. Aortic atherosclerosis. Aortic Atherosclerosis (ICD10-I70.0). Electronically Signed   By: Ted Mcalpine M.D.   On: 08/05/2023 19:36   DG Chest Port 1 View  Result Date: 08/04/2023 CLINICAL DATA:  History of multiple myeloma with sepsis EXAM: PORTABLE CHEST 1 VIEW COMPARISON:  Chest radiograph dated 06/20/2023 FINDINGS: Unchanged elevation of the right hemidiaphragm. Normal lung volumes. Heterogeneous left retrocardiac opacity. No pleural effusion or pneumothorax. The heart size and mediastinal contours are within normal limits. No acute osseous abnormality. IMPRESSION: 1. Heterogeneous left retrocardiac opacity, likely reflecting known hernia. 2.  No new focal consolidations. Electronically Signed   By: Agustin Cree M.D.   On: 08/04/2023 12:01    Assessment/Plan Patient with history of multiple myeloma in remission but followed by bone marrow failure after CART therapy Patient continues to have episodes in which he becomes hypoxic.  Weak confused.  Infection has been ruled out in last admission Discussed  today with the family which is in agreement that patient quality of life is poor and is negatively getting effected  by multiple hospital admissions. At this time they have decided to keep the patient in the facility They are open to Hospice if he worsens MOST form signed. He is comfort care  with IV fluids and Antibiotics if needed  Total time spent in this patient care encounter was 45 _  minutes; greater than 50% of the visit spent counseling patient and staff, reviewing records , Labs and coordinating care for problems addressed at this encounter.   Family/ staff Communication:   Labs/tests ordered:

## 2023-08-30 ENCOUNTER — Inpatient Hospital Stay: Payer: Medicare Other

## 2023-08-30 ENCOUNTER — Ambulatory Visit: Payer: Medicare Other | Admitting: Oncology

## 2023-08-30 ENCOUNTER — Other Ambulatory Visit: Payer: Medicare Other

## 2023-08-30 ENCOUNTER — Ambulatory Visit: Payer: Medicare Other

## 2023-08-30 ENCOUNTER — Inpatient Hospital Stay: Payer: Medicare Other | Admitting: Oncology

## 2023-08-30 VITALS — BP 101/77 | HR 94 | Temp 98.2°F | Resp 18 | Wt 176.0 lb

## 2023-08-30 DIAGNOSIS — C9000 Multiple myeloma not having achieved remission: Secondary | ICD-10-CM | POA: Diagnosis not present

## 2023-08-30 DIAGNOSIS — D696 Thrombocytopenia, unspecified: Secondary | ICD-10-CM

## 2023-08-30 LAB — CBC WITH DIFFERENTIAL (CANCER CENTER ONLY)
Abs Immature Granulocytes: 0.05 10*3/uL (ref 0.00–0.07)
Basophils Absolute: 0 10*3/uL (ref 0.0–0.1)
Basophils Relative: 1 %
Eosinophils Absolute: 0.2 10*3/uL (ref 0.0–0.5)
Eosinophils Relative: 4 %
HCT: 25.5 % — ABNORMAL LOW (ref 39.0–52.0)
Hemoglobin: 7.8 g/dL — ABNORMAL LOW (ref 13.0–17.0)
Immature Granulocytes: 1 %
Lymphocytes Relative: 16 %
Lymphs Abs: 0.7 10*3/uL (ref 0.7–4.0)
MCH: 32.1 pg (ref 26.0–34.0)
MCHC: 30.6 g/dL (ref 30.0–36.0)
MCV: 104.9 fL — ABNORMAL HIGH (ref 80.0–100.0)
Monocytes Absolute: 0.4 10*3/uL (ref 0.1–1.0)
Monocytes Relative: 10 %
Neutro Abs: 2.7 10*3/uL (ref 1.7–7.7)
Neutrophils Relative %: 68 %
Platelet Count: 44 10*3/uL — ABNORMAL LOW (ref 150–400)
RBC: 2.43 MIL/uL — ABNORMAL LOW (ref 4.22–5.81)
RDW: 20.5 % — ABNORMAL HIGH (ref 11.5–15.5)
WBC Count: 4 10*3/uL (ref 4.0–10.5)
nRBC: 0.5 % — ABNORMAL HIGH (ref 0.0–0.2)

## 2023-08-30 LAB — CMP (CANCER CENTER ONLY)
ALT: 9 U/L (ref 0–44)
AST: 14 U/L — ABNORMAL LOW (ref 15–41)
Albumin: 3.7 g/dL (ref 3.5–5.0)
Alkaline Phosphatase: 71 U/L (ref 38–126)
Anion gap: 8 (ref 5–15)
BUN: 11 mg/dL (ref 8–23)
CO2: 27 mmol/L (ref 22–32)
Calcium: 9.1 mg/dL (ref 8.9–10.3)
Chloride: 105 mmol/L (ref 98–111)
Creatinine: 0.86 mg/dL (ref 0.61–1.24)
GFR, Estimated: >60 mL/min (ref >60)
Glucose, Bld: 102 mg/dL — ABNORMAL HIGH (ref 70–99)
Potassium: 3.9 mmol/L (ref 3.5–5.1)
Sodium: 140 mmol/L (ref 135–145)
Total Bilirubin: 0.5 mg/dL (ref <1.2)
Total Protein: 5.9 g/dL — ABNORMAL LOW (ref 6.5–8.1)

## 2023-08-30 NOTE — Addendum Note (Signed)
Addended by: Wandalee Ferdinand on: 08/30/2023 02:24 PM   Modules accepted: Orders

## 2023-08-30 NOTE — Progress Notes (Signed)
[facility] OFFICE PROGRESS NOTE   Diagnosis: Multiple myeloma, pancytopenia  INTERVAL HISTORY:   Mr. Byrdsong was discharged from the hospital on 08/17/2023 after admission with a fever and respiratory failure.  He remains in the rehab unit at [facility].  He has chronic confusion.  This has worsened.  He had an episode of nausea, incontinence, and a fall last week.  Mr. Brandenberger and his family have met with Dr. Chales Abrahams.  They have decided to focus on comfort care and avoid repeat trips to the hospital. He has no specific complaint today.  He is here with his wife.  Objective:  Vital signs in last 24 hours:  Blood pressure 101/77, pulse 94, temperature 98.2 F (36.8 C), temperature source Temporal, resp. rate 18, weight 176 lb (79.8 kg), SpO2 96%.    HEENT: Petechiae at the right buccal mucosa, no thrush  Resp: Decreased breath sounds with inspiratory rhonchi at the right posterior base, no respiratory distress Cardio: Regular rate and rhythm Vascular: Trace lower leg edema bilaterally   Lab Results:  Lab Results  Component Value Date   WBC 6.0 08/24/2023   HGB 7.6 (L) 08/24/2023   HCT 24.3 (L) 08/24/2023   MCV 109.5 (H) 08/24/2023   PLT 35 (L) 08/24/2023   NEUTROABS 4.2 08/24/2023    CMP  Lab Results  Component Value Date   NA 137 08/17/2023   K 3.6 08/17/2023   CL 104 08/17/2023   CO2 26 08/17/2023   GLUCOSE 103 (H) 08/17/2023   BUN 14 08/17/2023   CREATININE 0.81 08/17/2023   CALCIUM 8.7 (L) 08/17/2023   PROT 6.2 (L) 08/11/2023   ALBUMIN 3.3 (L) 08/11/2023   AST 19 08/11/2023   ALT 10 08/11/2023   ALKPHOS 64 08/11/2023   BILITOT 0.5 08/11/2023   GFRNONAA >60 08/17/2023   GFRAA >60 06/15/2020     Medications: I have reviewed the patient's current medications.   Assessment/Plan:    Disposition: Multiple myeloma- IgG lambda  bortezomib (subcutaneously), lenalidomide, and dexamethasone, with repeat bone marrow biopsy May of 2012  showing 10% plasmacytosis   (2) high-dose chemotherapy with BCNU and melphalan at [facility], followed by stem cell rescue July of 2012   (3) on zoledronic acid started December of 2012, initially monthly, currently given every 3 months, most recent dose  12/07/2015   (4) low-dose lenalidomide resumed April 2013, interrupted several times.  Resumed again on 02/19/2013, ata dose of 5 mg daily, 21 days on and 7 days off, later further reduced to 7 days on, 7 days off   (5) CNS symptoms and abnormal brain MRI extensively evaluated by neurology with no definitive diagnosis established   (6) rising M spike noted June 2014 but did not meet criteria for carfilzomib study   (7) on lenalidomide 25 mg daily, 14 days on, 7 days off, starting 04/18/2013, interrupted December 2014 because of rash;             (a) resumed January 2015 at 10 mg/ day at 21 days on/ 7 days off             (b) starting 08/18/2014 decreased to 10 mg/ day 14 days on, 7 days off because of cytopenias             (c) as of February 2016 was on 5 mg daily 7 days on 7 days off             (d) lenalidomide discontinued December 2016 with evidence of  disease progression   (8) transient global amnesia 05/29/2015, resolved without intervention   (9) starting PVD 10/25/2015 w ASA 325 thromboprophylaxis, valacyclovir prophylaxis, last dose 12/17/2015             (a) pomalidomide 4 mg/d days 1-14             (b) bortezomib sQ days 2,5,9,12 of each 21 day cycle             (c) dexamethasome 20 mg two days a week             (d) dexamethasone bortezomib and pomalidomide discontinued late December 2018 with poor tolerance   (10) metapneumovirus pneumonia April 2017             (a) completing course of steroids and week of bactrim mid April 2017   (11) status post second autologous transplant at [facility] 02/04/2016(preparatory regimen melphalan 200 mg/m)             (a) received twelve-month vaccinations 03/14/2017 (DPT, Haemophilus, Pneumovax 13,  polio)             (b) 14 months injections 05/04/2016 include DTaP, Hib conjugate, HepB energex B 20 mcg/ml, Prevnar 13             (c) 24 month vaccines due at [facility] June 2019   (12) maintenance therapy started November 2017, consisting of             (a) bortezomib 1.3 mg/M2 every 14 days, first dose 07/27/2016             (b) pomalidomide 1 mg days 1-21 Q28 days, started 07/19/2016             (c) zolendronate monthly started 07/27/2016 (previously Q 3 months) however patient unable to tolerate, and changed back to q3 months in April, 2018             (d) Bortezomib changed to monthly as of June 2018 because of tolerance issues, however discontinued after 09/14/2017 dose because of a rise in his M spike             (e) pomalidomide held after 10/18/2017 in preparation for possible study at [facility] (venetoclax)--never resumed             (f) with numbers actually improved off treatment, resumed every 2-week bortezomib 12/25/2017             (g) changed to every 3-week bortezomib as of 03/17/2019             (h) briefly on weekly treatments times 22 October 2020 due to increase in M spike             (I) maintenance therapy discontinued with evidence of progression   (13) bortezomib/daratumumab/dexamethasone started 12/20/2020.             (a) Decadron dose dropped to 10 mg day of and day following treatment starting May 2022             (b) day 8 bortezomib omitted beginning with the June cycle per patient preference             (C) treatment changed to monthly daratumumab/Velcade/Decadron beginning 06/27/2021-patient decision (14) weekly Velcade/Cytoxan beginning 11/14/2021 (15) changed to Cytoxan/carfilzomib/Decadron 12/19/2021-treatment placed on hold 05/15/2022, carfilzomib 05/24/2022 and 05/31/2022 16.  Leukocyte a pheresis procedure at [facility] 06/15/2022 17.  Treatment resumed with Cytoxan/carfilzomib/Decadron 06/28/2022, last given 09/06/2022 18.  CAR-T 09/26/2022 with Carvykti 19.  CRS  and ICANS following CAR-T therapy, status post a Decadron taper completed 10/08/2022 20.  Presentation to the emergency room 10/30/2022 with failure to thrive and a fall  21.  Pancytopenia secondary to multiple myeloma and CAR-T therapy-improved 22.  Bone marrow biopsy 11/13/2022-mildly hypocellular bone marrow with relative erythroid hyperplasia and decreased megakaryocytes; no plasma cells identified by differential count or CD138 immunohistochemical stain; flow cytometry negative for a clonal plasma cell population.   bone marrow biopsy 05/10/2023-mildly hypercellular marrow with mild decrease in megakaryocytes, plasma cells not increased, absent iron stores, 46 XY, negative myeloma FISH panel, negative myeloid panel 23.  Cognitive impairment predating CAR-T therapy and worsened following CAR-T therapy 24.  Admission 05/05/2023 with Pseudomonas sepsis CTs chest, abdomen, and pelvis-right lung base atelectasis, no infiltrate 25.  Admission 05/20/2023 with hypoxia, altered mental status, and fever Infectious disease evaluation on hospital admission consistent with a urinary tract infection, urine culture positive for Pseudomonas aeruginosa 26.  Hypogammaglobulinemia secondary to multiple myeloma and CAR-T therapy Monthly IVIG 27.  Admission 06/14/2023 with severe anemia, hypotension, and report of a positive blood culture from 06/13/2023?  With gram-positive cocci in clusters, recent fall with a plain x-ray 06/14/2023 confirming left sixth and seventh rib fractures 28.  Thrombocytopenia-possibly bone marrow failure from extensive course of treatment, possible treatment related MDS, immune thrombocytopenia, atypical infection 06/20/2023 CMV and parvovirus negative  weekly Nplate beginning 06/20/2023 Nplate dose escalated per protocol 07/06/2023 29.  Admission 08/04/2023 with a fever 30.  Admission 08/11/2023 with a fever   Mr. Engeman has a history of multiple myeloma.  He has been treated with multiple  courses of systemic therapy including stem cell therapy and CAR-T therapy.  He has persistent pancytopenia.  I suspect he has treatment related MDS.  He has progressive confusion and an overall decline in his performance status.  I discussed management options with Mr. Hariri and his wife.  I agree with the plan for comfort care.  They do not plan to take him to the hospital unless there is a more significant acute issue.  I recommend hospice care.  His wife reports a hospice team is in place at [facility].  He will continue day-to-day follow-up with Dr. Chales Abrahams.  We will discontinue Nplate and monthly IVIG.  He will return for an office visit in 4 weeks.  He will wean the oxygen as tolerated. Thornton Papas, MD  08/30/2023  8:32 AM

## 2023-08-30 NOTE — Progress Notes (Signed)
Arrived with oxygen at 6 L/min [place]. Dr. Truett Perna requested to decrease and check sats. Sat 97 % on 4 l/min Sat 98 % on 3 l/min Sat 95 % on 2 l/min MD request facility maintain 2 l/min and to wean off as able.  Faxed office note to facility.

## 2023-09-03 ENCOUNTER — Other Ambulatory Visit: Payer: Self-pay | Admitting: Adult Health

## 2023-09-03 DIAGNOSIS — F909 Attention-deficit hyperactivity disorder, unspecified type: Secondary | ICD-10-CM

## 2023-09-03 MED ORDER — METHYLPHENIDATE HCL 5 MG PO TABS: 2.5000 mg | ORAL_TABLET | Freq: Every morning | ORAL | 0 refills | Status: DC

## 2023-09-06 ENCOUNTER — Other Ambulatory Visit: Payer: Medicare Other

## 2023-09-06 ENCOUNTER — Inpatient Hospital Stay: Payer: Medicare Other

## 2023-09-13 ENCOUNTER — Inpatient Hospital Stay: Payer: Medicare Other

## 2023-09-13 ENCOUNTER — Other Ambulatory Visit: Payer: Medicare Other

## 2023-09-20 ENCOUNTER — Other Ambulatory Visit: Payer: Medicare Other

## 2023-09-20 ENCOUNTER — Ambulatory Visit: Payer: Medicare Other

## 2023-09-26 ENCOUNTER — Other Ambulatory Visit: Payer: Self-pay | Admitting: Nurse Practitioner

## 2023-09-26 ENCOUNTER — Other Ambulatory Visit: Payer: Self-pay

## 2023-09-26 DIAGNOSIS — C9000 Multiple myeloma not having achieved remission: Secondary | ICD-10-CM

## 2023-09-27 ENCOUNTER — Other Ambulatory Visit: Payer: Medicare Other

## 2023-09-27 ENCOUNTER — Inpatient Hospital Stay: Payer: Medicare Other

## 2023-09-27 ENCOUNTER — Ambulatory Visit: Payer: Medicare Other

## 2023-09-27 ENCOUNTER — Inpatient Hospital Stay: Payer: Medicare Other | Attending: Oncology | Admitting: Nurse Practitioner

## 2023-09-27 ENCOUNTER — Ambulatory Visit: Payer: Medicare Other | Admitting: Nurse Practitioner

## 2023-09-27 ENCOUNTER — Encounter: Payer: Self-pay | Admitting: Nurse Practitioner

## 2023-09-27 VITALS — BP 105/70 | HR 95 | Temp 98.1°F | Resp 18 | Ht 67.0 in | Wt 181.0 lb

## 2023-09-27 DIAGNOSIS — D649 Anemia, unspecified: Secondary | ICD-10-CM | POA: Diagnosis not present

## 2023-09-27 DIAGNOSIS — D696 Thrombocytopenia, unspecified: Secondary | ICD-10-CM | POA: Diagnosis not present

## 2023-09-27 DIAGNOSIS — C9000 Multiple myeloma not having achieved remission: Secondary | ICD-10-CM

## 2023-09-27 DIAGNOSIS — D801 Nonfamilial hypogammaglobulinemia: Secondary | ICD-10-CM | POA: Insufficient documentation

## 2023-09-27 LAB — CBC WITH DIFFERENTIAL (CANCER CENTER ONLY)
Abs Immature Granulocytes: 0.2 10*3/uL — ABNORMAL HIGH (ref 0.00–0.07)
Basophils Absolute: 0 10*3/uL (ref 0.0–0.1)
Basophils Relative: 1 %
Eosinophils Absolute: 0.1 10*3/uL (ref 0.0–0.5)
Eosinophils Relative: 3 %
HCT: 24.5 % — ABNORMAL LOW (ref 39.0–52.0)
Hemoglobin: 7.2 g/dL — ABNORMAL LOW (ref 13.0–17.0)
Lymphocytes Relative: 12 %
Lymphs Abs: 0.6 10*3/uL — ABNORMAL LOW (ref 0.7–4.0)
MCH: 27.5 pg (ref 26.0–34.0)
MCHC: 29.4 g/dL — ABNORMAL LOW (ref 30.0–36.0)
MCV: 93.5 fL (ref 80.0–100.0)
Metamyelocytes Relative: 1 %
Monocytes Absolute: 0.1 10*3/uL (ref 0.1–1.0)
Monocytes Relative: 3 %
Myelocytes: 3 %
Neutro Abs: 3.7 10*3/uL (ref 1.7–7.7)
Neutrophils Relative %: 77 %
Platelet Count: 66 10*3/uL — ABNORMAL LOW (ref 150–400)
RBC: 2.62 MIL/uL — ABNORMAL LOW (ref 4.22–5.81)
RDW: 25.2 % — ABNORMAL HIGH (ref 11.5–15.5)
WBC Count: 4.8 10*3/uL (ref 4.0–10.5)
nRBC: 0.6 % — ABNORMAL HIGH (ref 0.0–0.2)

## 2023-09-27 LAB — CMP (CANCER CENTER ONLY)
ALT: 13 U/L (ref 0–44)
AST: 14 U/L — ABNORMAL LOW (ref 15–41)
Albumin: 4.2 g/dL (ref 3.5–5.0)
Alkaline Phosphatase: 91 U/L (ref 38–126)
Anion gap: 9 (ref 5–15)
BUN: 14 mg/dL (ref 8–23)
CO2: 25 mmol/L (ref 22–32)
Calcium: 9 mg/dL (ref 8.9–10.3)
Chloride: 104 mmol/L (ref 98–111)
Creatinine: 0.89 mg/dL (ref 0.61–1.24)
GFR, Estimated: >60 mL/min (ref >60)
Glucose, Bld: 117 mg/dL — ABNORMAL HIGH (ref 70–99)
Potassium: 4.4 mmol/L (ref 3.5–5.1)
Sodium: 138 mmol/L (ref 135–145)
Total Bilirubin: 0.6 mg/dL (ref 0.0–1.2)
Total Protein: 6.2 g/dL — ABNORMAL LOW (ref 6.5–8.1)

## 2023-09-27 LAB — SAMPLE TO BLOOD BANK

## 2023-09-27 LAB — MAGNESIUM: Magnesium: 2.2 mg/dL (ref 1.7–2.4)

## 2023-09-27 NOTE — Progress Notes (Signed)
 [facility] OFFICE PROGRESS NOTE   Diagnosis: Multiple myeloma, pancytopenia  INTERVAL HISTORY:   Andre [name] returns for follow-up.  He has no complaint except fatigue.  No shortness of breath.  No bleeding.  No fever.  No nausea or diarrhea.  He has occasional night sweats.  Appetite is good.  No falls.  Objective:  Vital signs in last 24 hours:  Blood pressure 105/70, pulse 95, temperature 98.1 F (36.7 C), temperature source Temporal, resp. rate 18, height 5' 7 (1.702 m), weight 181 lb (82.1 kg), SpO2 98%.    HEENT: No thrush or ulcers. Lymphatics: No palp cervical or supraclavicular lymph nodes. Resp: Lungs clear bilaterally. Cardio: Regular rate and rhythm. GI: No hepatosplenomegaly. Vascular: Trace lower leg edema bilaterally.   Lab Results:  Lab Results  Component Value Date   WBC 4.8 09/27/2023   HGB 7.2 (L) 09/27/2023   HCT 24.5 (L) 09/27/2023   MCV 93.5 09/27/2023   PLT 66 (L) 09/27/2023   NEUTROABS 3.7 09/27/2023    Imaging:  No results found.  Medications: I have reviewed the patient's current medications.  Assessment/Plan: Multiple myeloma- IgG lambda  bortezomib  (subcutaneously), lenalidomide , and dexamethasone , with repeat bone marrow biopsy May of 2012 showing 10% plasmacytosis   (2) high-dose chemotherapy with BCNU and melphalan at [facility], followed by stem cell rescue July of 2012   (3) on zoledronic  acid started December of 2012, initially monthly, currently given every 3 months, most recent dose  12/07/2015   (4) low-dose lenalidomide  resumed April 2013, interrupted several times.  Resumed again on 02/19/2013, ata dose of 5 mg daily, 21 days on and 7 days off, later further reduced to 7 days on, 7 days off   (5) CNS symptoms and abnormal brain MRI extensively evaluated by neurology with no definitive diagnosis established   (6) rising M spike noted June 2014 but did not meet criteria for carfilzomib  study   (7) on  lenalidomide  25 mg daily, 14 days on, 7 days off, starting 04/18/2013, interrupted December 2014 because of rash;             (a) resumed January 2015 at 10 mg/ day at 21 days on/ 7 days off             (b) starting 08/18/2014 decreased to 10 mg/ day 14 days on, 7 days off because of cytopenias             (c) as of February 2016 was on 5 mg daily 7 days on 7 days off             (d) lenalidomide  discontinued December 2016 with evidence of disease progression   (8) transient global amnesia 05/29/2015, resolved without intervention   (9) starting PVD 10/25/2015 w ASA 325 thromboprophylaxis, valacyclovir  prophylaxis, last dose 12/17/2015             (a) pomalidomide  4 mg/d days 1-14             (b) bortezomib  sQ days 2,5,9,12 of each 21 day cycle             (c) dexamethasome 20 mg two days a week             (d) dexamethasone  bortezomib  and pomalidomide  discontinued late December 2018 with poor tolerance   (10) metapneumovirus pneumonia April 2017             (a) completing course of steroids and week of bactrim  mid April 2017   (  11) status post second autologous transplant at [facility] 02/04/2016(preparatory regimen melphalan 200 mg/m)             (a) received twelve-month vaccinations 03/14/2017 (DPT, Haemophilus, Pneumovax 13, polio)             (b) 14 months injections 05/04/2016 include DTaP, Hib conjugate, HepB energex B 20 mcg/ml, Prevnar 13             (c) 24 month vaccines due at [facility] June 2019   (12) maintenance therapy started November 2017, consisting of             (a) bortezomib  1.3 mg/M2 every 14 days, first dose 07/27/2016             (b) pomalidomide  1 mg days 1-21 Q28 days, started 07/19/2016             (c) zolendronate monthly started 07/27/2016 (previously Q 3 months) however patient unable to tolerate, and changed back to q3 months in April, 2018             (d) Bortezomib  changed to monthly as of June 2018 because of tolerance issues, however discontinued after 09/14/2017  dose because of a rise in his M spike             (e) pomalidomide  held after 10/18/2017 in preparation for possible study at [facility] (venetoclax)--never resumed             (f) with numbers actually improved off treatment, resumed every 2-week bortezomib  12/25/2017             (g) changed to every 3-week bortezomib  as of 03/17/2019             (h) briefly on weekly treatments times 22 October 2020 due to increase in M spike             (I) maintenance therapy discontinued with evidence of progression   (13) bortezomib /daratumumab /dexamethasone  started 12/20/2020.             (a) Decadron  dose dropped to 10 mg day of and day following treatment starting May 2022             (b) day 8 bortezomib  omitted beginning with the June cycle per patient preference             (C) treatment changed to monthly daratumumab /Velcade /Decadron  beginning 06/27/2021-patient decision (14) weekly Velcade /Cytoxan  beginning 11/14/2021 (15) changed to Cytoxan /carfilzomib /Decadron  12/19/2021-treatment placed on hold 05/15/2022, carfilzomib  05/24/2022 and 05/31/2022 16.  Leukocyte a pheresis procedure at [facility] 06/15/2022 17.  Treatment resumed with Cytoxan /carfilzomib /Decadron  06/28/2022, last given 09/06/2022 18.  CAR-T 09/26/2022 with Carvykti 19.  CRS and ICANS following CAR-T therapy, status post a Decadron  taper completed 10/08/2022 20.  Presentation to the emergency room 10/30/2022 with failure to thrive and a fall  21.  Pancytopenia secondary to multiple myeloma and CAR-T therapy-improved 22.  Bone marrow biopsy 11/13/2022-mildly hypocellular bone marrow with relative erythroid hyperplasia and decreased megakaryocytes; no plasma cells identified by differential count or CD138 immunohistochemical stain; flow cytometry negative for a clonal plasma cell population.   bone marrow biopsy 05/10/2023-mildly hypercellular marrow with mild decrease in megakaryocytes, plasma cells not increased, absent iron stores, 46 XY, negative  myeloma FISH panel, negative myeloid panel 23.  Cognitive impairment predating CAR-T therapy and worsened following CAR-T therapy 24.  Admission 05/05/2023 with Pseudomonas sepsis CTs chest, abdomen, and pelvis-right lung base atelectasis, no infiltrate 25.  Admission 05/20/2023 with hypoxia, altered mental status, and  fever Infectious disease evaluation on hospital admission consistent with a urinary tract infection, urine culture positive for Pseudomonas aeruginosa 26.  Hypogammaglobulinemia secondary to multiple myeloma and CAR-T therapy Monthly IVIG 27.  Admission 06/14/2023 with severe anemia, hypotension, and report of a positive blood culture from 06/13/2023?  With gram-positive cocci in clusters, recent fall with a plain x-ray 06/14/2023 confirming left sixth and seventh rib fractures 28.  Thrombocytopenia-possibly bone marrow failure from extensive course of treatment, possible treatment related MDS, immune thrombocytopenia, atypical infection 06/20/2023 CMV and parvovirus negative  weekly Nplate  beginning 06/20/2023 Nplate  dose escalated per protocol 07/06/2023 29.  Admission 08/04/2023 with a fever 30.  Admission 08/11/2023 with a fever    Disposition: Andre [name] appears stable.  He is being followed on a supportive care approach.  We reviewed the CBC from today.  The white count is normal and platelet count has improved.  He has stable anemia.  He does not feel he needs a blood transfusion at present.  We reviewed signs/symptoms suggestive of progressive anemia.  He will return for a CBC and follow-up visit in approximately 4 weeks.  He and his wife understand to contact the office if he feels he needs a blood transfusion.  Patient seen with Dr. Cloretta.    Olam Ned ANP/GNP-BC   09/27/2023  10:10 AM  This was a shared visit with Olam Ned.  Mr. Holcomb appears unchanged.  He appears asymptomatic from anemia.  The platelet count has improved.  The plan is to continue following  the CBC and provide transfusion support as needed for symptomatic anemia.  We discussed the plan with his wife.  I was present for greater than 50% of today's visit.  I performed medical decision making.  Arvella Cloretta, MD

## 2023-09-28 ENCOUNTER — Encounter: Payer: Self-pay | Admitting: Oncology

## 2023-10-04 ENCOUNTER — Non-Acute Institutional Stay (SKILLED_NURSING_FACILITY): Payer: Self-pay | Admitting: Adult Health

## 2023-10-04 ENCOUNTER — Encounter: Payer: Self-pay | Admitting: Adult Health

## 2023-10-04 DIAGNOSIS — I951 Orthostatic hypotension: Secondary | ICD-10-CM

## 2023-10-04 DIAGNOSIS — E039 Hypothyroidism, unspecified: Secondary | ICD-10-CM

## 2023-10-04 DIAGNOSIS — R4189 Other symptoms and signs involving cognitive functions and awareness: Secondary | ICD-10-CM

## 2023-10-04 DIAGNOSIS — D649 Anemia, unspecified: Secondary | ICD-10-CM

## 2023-10-04 DIAGNOSIS — N401 Enlarged prostate with lower urinary tract symptoms: Secondary | ICD-10-CM

## 2023-10-04 DIAGNOSIS — C9000 Multiple myeloma not having achieved remission: Secondary | ICD-10-CM

## 2023-10-04 DIAGNOSIS — R338 Other retention of urine: Secondary | ICD-10-CM

## 2023-10-04 LAB — TSH: TSH: 1.47 (ref 0.41–5.90)

## 2023-10-04 NOTE — Progress Notes (Signed)
Location:  Medical illustrator of Service:  SNF (31) Provider:  Tamsen Roers, MD  Patient Care Team: [name], MD as PCP - General (Internal Medicine) Shirlean Schlein, MD as Consulting Physician (Neurology) Eddie Candle, MD as Consulting Physician (Internal Medicine) York Spaniel, MD (Inactive) as Consulting Physician (Neurology) Sharlett Iles, DDS as Referring Physician (Oral Surgery) Ladene Artist, MD as Consulting Physician (Oncology) Fletcher Anon, NP as Nurse Practitioner (Nurse Practitioner)  Extended Emergency Contact Information Primary Emergency Contact: Gelardi,[name] Address: [address]          [place], [place] [address] [name] of [place] Home Phone: [phone] Relation: Spouse  Code Status:  DNR, Most form comfort care.  Goals of care: Advanced Directive information    09/27/2023    9:02 AM  Advanced Directives  Does Patient Have a Medical Advance Directive? Yes  Type of Advance Directive Out of facility DNR (pink MOST or yellow form)     Chief Complaint  Patient presents with   Medical Management of Chronic Issues    HPI:  Pt is a 73 y.o. male seen today for medical management   PMH of multiple myeloma (s/p chemo and CAR T therapy, currently receiving IVIG, followed by oncology), chronic pancytopenia, hypothyroidism, and mild cognitive impairment secondary to CART and chronic microvascular changes.   He is currently in skilled rehab after hospitalization. He is a skilled level of care and is waiting on a skilled care bed.   Had several hospital admissions in the past year for fever. Unknown origin. Has had pseudomonas in urine. 1 pos blood culture but was likely a contaminant. Also has hypoxia with unclear etiology. Dependent on 2 liters. Chest CT did show a hiatal hernia and atelectasis.   Multiple myeloma: in remission, off Nplate and IVIG due to concern for  fever. No further fever here at [facility] since it was stopped.   History of heme positive stool in 2024. Not worked up due to goals of care.   Urinary retention: on flomax, followed by urology. No current symptoms  Orthostatic hypotension: on midodrine. No current issues.   Has recurrent falls, poor safety awareness. Uses a walker.   Saw oncology 09/27/23 with supportive approach. Hgb 7.2 WBC 4.8 Plt 66K  Hypothyroid TSH 1.47 10/04/23  Past Medical History:  Diagnosis Date   Anemia    Arthritis    Asthma    no treatment x 20 years   Depression    Double vision    occurs at times    Duodenal ulcer    GERD (gastroesophageal reflux disease)    Hemorrhage of rectum and anus 09/15/2013   Hyperlipidemia    Hypertension    Hypothyroidism    Leukopenia due to antineoplastic chemotherapy (HCC) 02/22/2017   Multiple myeloma 07/04/2011   Thyroid disease    TIA (transient ischemic attack) 03/27/2012   Transient diplopia 03/27/2012   Transient global amnesia 06/07/2015   Past Surgical History:  Procedure Laterality Date   BONE MARROW TRANSPLANT  2011   for MM   CARDIOLITE STUDY  11/25/2003   NORMAL   TONSILLECTOMY      Allergies  Allergen Reactions   Atorvastatin Other (See Comments)    (LIPITOR) "Allergic," per [facility]   Rosuvastatin Other (See Comments)    (CRESTOR) Liver Disorder (adverse effects) and "Allergic," per [facility]   Beef-Derived Drug Products Other (See Comments)    pescatarian    Chicken  Protein Other (See Comments)    pescatarian   Pork-Derived Products Other (See Comments)    pescatarian    Septra [Sulfamethoxazole-Trimethoprim] Rash and Other (See Comments)    "Allergic," per [facility]    Outpatient Encounter Medications as of 10/04/2023  Medication Sig   acetaminophen (TYLENOL) 500 MG tablet Take 2 tablets (1,000 mg total) by mouth every 8 (eight) hours.   Calcium Carb-Cholecalciferol (CALCIUM + VITAMIN D3) 500-5 MG-MCG TABS Take 1 tablet by mouth in the morning.    calcium carbonate (TUMS EX) 750 MG chewable tablet Chew 2 tablets by mouth 3 (three) times daily as needed (for indigestion).   Cholecalciferol (VITAMIN D3) 25 MCG (1000 UT) capsule Take 1,000 Units by mouth daily.   cyanocobalamin (VITAMIN B12) 1000 MCG tablet Take 1 tablet (1,000 mcg total) by mouth daily. (Patient taking differently: Take 1,000 mcg by mouth in the morning.)   dapsone 100 MG tablet Take 100 mg by mouth daily.   docusate sodium (COLACE) 100 MG capsule Take 100 mg by mouth daily as needed (for constipation).   escitalopram (LEXAPRO) 10 MG tablet Take 1 tablet (10 mg total) by mouth daily.   ferrous sulfate 325 (65 FE) MG EC tablet Take 325 mg by mouth 2 (two) times daily.   folic acid (FOLVITE) 1 MG tablet Take 1 tablet (1 mg total) by mouth daily. (Patient taking differently: Take 1 mg by mouth in the morning.)   levothyroxine (SYNTHROID) 125 MCG tablet Take 125 mcg by mouth daily before breakfast.   Menthol-Methyl Salicylate (MUSCLE RUB) 10-15 % CREA Apply 1 Application topically as needed for muscle pain.   midodrine (PROAMATINE) 2.5 MG tablet Take 1 tablet (2.5 mg total) by mouth 2 (two) times daily with a meal. (Patient taking differently: Take 2.5 mg by mouth 2 (two) times daily with a meal. Hold is SBP >150)   ondansetron (ZOFRAN-ODT) 4 MG disintegrating tablet Take 4 mg by mouth every 6 (six) hours as needed for nausea or vomiting.   pantoprazole (PROTONIX) 40 MG tablet Take 1 tablet (40 mg total) by mouth daily. (Patient taking differently: Take 40 mg by mouth daily with breakfast.)   Probiotic Product (UP4 PROBIOTICS WOMENS PO) Take 1 capsule by mouth daily. Hyperbiotics pro women wit D manos plus cranberry   tamsulosin (FLOMAX) 0.4 MG CAPS capsule Take 0.4 mg by mouth at bedtime.   traMADol (ULTRAM) 50 MG tablet Take 25-50 mg by mouth every 12 (twelve) hours as needed for moderate pain (pain score 4-6) or severe pain (pain score 7-10).   valACYclovir (VALTREX) 500 MG  tablet Take 500 mg by mouth in the morning.   [DISCONTINUED] prochlorperazine (COMPAZINE) 10 MG tablet Take 1 tablet (10 mg total) by mouth every 6 (six) hours as needed (Nausea or vomiting).   No facility-administered encounter medications on file as of 10/04/2023.    Review of Systems  Constitutional:  Negative for activity change, appetite change, chills, diaphoresis, fatigue, fever and unexpected weight change.  Respiratory:  Negative for cough, shortness of breath, wheezing and stridor.   Cardiovascular:  Positive for leg swelling. Negative for chest pain and palpitations.  Gastrointestinal:  Negative for abdominal distention, abdominal pain, constipation and diarrhea.  Genitourinary:  Negative for difficulty urinating and dysuria.  Musculoskeletal:  Positive for gait problem. Negative for arthralgias, back pain, joint swelling and myalgias.  Neurological:  Positive for dizziness. Negative for seizures, syncope, facial asymmetry, speech difficulty, weakness and headaches.  Hematological:  Negative for adenopathy. Does not  bruise/bleed easily.  Psychiatric/Behavioral:  Positive for confusion. Negative for agitation and behavioral problems.     Immunization History  Administered Date(s) Administered   DTaP / Hep B / IPV 05/04/2017   DTaP / HiB / IPV 03/06/2018   Fluad Quad(high Dose 65+) 06/09/2019, 06/25/2020, 07/25/2021, 05/30/2022   Fluad Trivalent(High Dose 65+) 08/04/2012, 07/03/2014, 06/16/2023   HIB (PRP-T) 05/04/2017   Hepatitis B, ADULT 03/14/2017, 03/06/2018   IPV 01/28/1998   Influenza, High Dose Seasonal PF 06/16/2018   Influenza,inj,Quad PF,6+ Mos 06/16/2013, 07/03/2014, 06/11/2015, 05/31/2017   Influenza-Unspecified 07/29/2012   Moderna Covid-19 Vaccine Bivalent Booster 74yrs & up 07/10/2023   PFIZER(Purple Top)SARS-COV-2 Vaccination 10/26/2019, 11/20/2019, 07/22/2020   Pfizer Covid-19 Vaccine Bivalent Booster 11yrs & up 06/27/2021   Pfizer(Comirnaty)Fall Seasonal  Vaccine 12 years and older 07/05/2022   Pneumococcal Conjugate-13 10/15/2013, 05/04/2017, 03/06/2018   Pneumococcal Polysaccharide-23 06/16/2013   Respiratory Syncytial Virus Vaccine,Recomb Aduvanted(Arexvy) 05/30/2022   Td 02/10/1998, 07/03/2014   Pertinent  Health Maintenance Due  Topic Date Due   Colonoscopy  09/24/2023   INFLUENZA VACCINE  Completed      08/30/2022   11:01 AM 09/06/2022   10:00 AM 02/15/2023    1:29 PM 06/11/2023   11:20 AM 06/27/2023   10:37 AM  Fall Risk  Falls in the past year?   1 0 1  Was there an injury with Fall?   1 0 1  Fall Risk Category Calculator   3 0 3  (RETIRED) Patient Fall Risk Level Low fall risk Low fall risk     Patient at Risk for Falls Due to    History of fall(s) History of fall(s);Impaired balance/gait;Impaired mobility  Fall risk Follow up   Falls evaluation completed Falls evaluation completed Falls evaluation completed;Education provided;Falls prevention discussed   Functional Status Survey:    Vitals:   10/04/23 1652  BP: 109/64  Pulse: 81  Resp: 16  Temp: 98.8 F (37.1 C)  SpO2: 90%  Weight: 176 lb 12.8 oz (80.2 kg)   Body mass index is 27.69 kg/m. Physical Exam Vitals and nursing note reviewed.  Constitutional:      General: He is not in acute distress.    Appearance: He is not diaphoretic.  HENT:     Head: Normocephalic and atraumatic.  Neck:     Thyroid: No thyromegaly.     Vascular: No JVD.     Trachea: No tracheal deviation.  Cardiovascular:     Rate and Rhythm: Normal rate and regular rhythm.     Heart sounds: No murmur heard. Pulmonary:     Effort: Pulmonary effort is normal. No respiratory distress.     Breath sounds: Normal breath sounds. No wheezing.  Abdominal:     General: Bowel sounds are normal. There is no distension.     Palpations: Abdomen is soft.     Tenderness: There is no abdominal tenderness.  Musculoskeletal:     Comments: BLE edema +1  Lymphadenopathy:     Cervical: No cervical  adenopathy.  Skin:    General: Skin is warm and dry.  Neurological:     General: No focal deficit present.     Mental Status: He is alert. Mental status is at baseline.     Cranial Nerves: No cranial nerve deficit.     Labs reviewed: Recent Labs    05/23/23 0413 06/06/23 4098 06/16/23 1191 06/17/23 4782 06/27/23 0933 08/02/23 0825 08/16/23 0402 08/17/23 0333 08/30/23 0750 09/27/23 0827  NA 134*   < >  137   < > 139   < > 132* 137 140 138  K 3.7   < > 3.9   < > 3.7   < > 3.3* 3.6 3.9 4.4  CL 101   < > 104   < > 105   < > 98 104 105 104  CO2 25   < > 25   < > 25   < > 27 26 27 25   GLUCOSE 97   < > 92   < > 114*   < > 94 103* 102* 117*  BUN 21   < > 17   < > 16   < > 15 14 11 14   CREATININE 0.91   < > 0.92   < > 0.93   < > 0.82 0.81 0.86 0.89  CALCIUM 8.9   < > 8.8*   < > 9.9   < > 8.8* 8.7* 9.1 9.0  MG 2.1  --  2.3   < > 1.9   < > 2.2 2.2  --  2.2  PHOS 2.4*  --  3.7  --  2.8  --   --   --   --   --    < > = values in this interval not displayed.   Recent Labs    08/11/23 1118 08/30/23 0750 09/27/23 0827  AST 19 14* 14*  ALT 10 9 13   ALKPHOS 64 71 91  BILITOT 0.5 0.5 0.6  PROT 6.2* 5.9* 6.2*  ALBUMIN 3.3* 3.7 4.2   Recent Labs    08/24/23 1051 08/30/23 0750 09/27/23 0827  WBC 6.0 4.0 4.8  NEUTROABS 4.2 2.7 3.7  HGB 7.6* 7.8* 7.2*  HCT 24.3* 25.5* 24.5*  MCV 109.5* 104.9* 93.5  PLT 35* 44* 66*   Lab Results  Component Value Date   TSH 1.47 10/04/2023   Lab Results  Component Value Date   HGBA1C 4.3 (L) 05/06/2023   Lab Results  Component Value Date   CHOL 178 02/13/2023   HDL 42 02/13/2023   LDLCALC 113 02/13/2023   LDLDIRECT 155.8 11/13/2012   TRIG 167 (A) 02/13/2023   CHOLHDL 4 09/06/2017    Significant Diagnostic Results in last 30 days:  No results found.  Assessment/Plan  1. Multiple myeloma, remission status unspecified (HCC) (Primary) Not currently on treatment Supportive care only No further fevers off treatment He declined  hospice because he wants to work with therapy Most from indicates comfort care.   2. Chronic anemia Hgb 7.2 Oncology indicated if he became symptomatic to contact them for transfusion   3. Orthostatic hypotension Doing well with compression hose and midodrine   4. Cognitive impairment With gait issues Skilled level of care Working with therapy   5. Acquired hypothyroidism Continue synthroid  6. Urinary retention due to benign prostatic hyperplasia Continue flomax.

## 2023-10-05 ENCOUNTER — Ambulatory Visit: Payer: Self-pay

## 2023-10-05 ENCOUNTER — Institutional Professional Consult (permissible substitution): Payer: Medicare Other | Admitting: Psychology

## 2023-10-12 ENCOUNTER — Encounter: Payer: Medicare Other | Admitting: Psychology

## 2023-10-22 ENCOUNTER — Encounter: Payer: Self-pay | Admitting: Adult Health

## 2023-10-22 ENCOUNTER — Non-Acute Institutional Stay (SKILLED_NURSING_FACILITY): Payer: Self-pay | Admitting: Adult Health

## 2023-10-22 DIAGNOSIS — R531 Weakness: Secondary | ICD-10-CM

## 2023-10-22 DIAGNOSIS — C9000 Multiple myeloma not having achieved remission: Secondary | ICD-10-CM

## 2023-10-22 DIAGNOSIS — R1011 Right upper quadrant pain: Secondary | ICD-10-CM

## 2023-10-22 DIAGNOSIS — R0902 Hypoxemia: Secondary | ICD-10-CM

## 2023-10-22 DIAGNOSIS — R509 Fever, unspecified: Secondary | ICD-10-CM

## 2023-10-22 NOTE — Progress Notes (Signed)
Location:  [facility] Room Number: 153A Place of Service:  SNF [phone]) Provider:  Tamsen Roers, MD  Patient Care Team: [name], MD as PCP - General (Internal Medicine) Shirlean Schlein, MD as Consulting Physician (Neurology) Eddie Candle, MD as Consulting Physician (Internal Medicine) York Spaniel, MD (Inactive) as Consulting Physician (Neurology) Sharlett Iles, DDS as Referring Physician (Oral Surgery) Ladene Artist, MD as Consulting Physician (Oncology) Fletcher Anon, NP as Nurse Practitioner (Nurse Practitioner)  Extended Emergency Contact Information Primary Emergency Contact: Mannings,[name] Address: [address]          [place], [place] [address] [name] Home Phone: [phone] Relation: Spouse  Code Status:  DNR Goals of care: Advanced Directive information    10/22/2023    8:58 AM  Advanced Directives  Does Patient Have a Medical Advance Directive? Yes  Type of Advance Directive Out of facility DNR (pink MOST or yellow form)  Does patient want to make changes to medical advance directive? No - Patient declined  Would patient like information on creating a medical advance directive? No - Patient declined  Pre-existing out of facility DNR order (yellow form or pink MOST form) Yellow form placed in chart (order not valid for inpatient use)     Chief Complaint  Patient presents with   Acute Visit    Patient is being seen for acute fever    HPI:   He is a 73 year old male with multiple myeloma who presents with right upper quadrant and flank pain.  He has been experiencing right upper quadrant and flank pain for several days, accompanied by fever, diaphoresis, and weakness. He has a history of multiple admissions for fever of unknown origin, which may be related to his current symptoms. He also is incontinent which is new.   He has a history of multiple  myeloma, with his last oncology visit on September 27, 2023, showing stable disease but borderline low hemoglobin and platelet levels. Previous treatments with Nplate and IVIG were discontinued due to concerns about fever. He also has a history of hypothyroidism, hypertension, hyperlipidemia, GERD, hiatal hernia, anemia, low platelets, and urinary retention. Also MCI due to CART therapy and microvascular changes, hx of heme pos stools.   He is experiencing worsening hypoxia, requiring four liters of oxygen to maintain an oxygen saturation of 90%, compared to his baseline requirement of two to three liters.  He exhibits weakness and requires increased assistance with activities of daily living, which may be related to his current illness or underlying conditions such as anemia or multiple myeloma.  Physical Exam VITALS: T- 100.6, SaO2- 90%  Results LABS Platelets: 66 K (09/27/2023) WBC: 4.8 (09/27/2023)  RADIOLOGY Chest x-ray: Old right rib fractures, hiatal hernia   Past Medical History:  Diagnosis Date   Anemia    Arthritis    Asthma    no treatment x 20 years   Depression    Double vision    occurs at times    Duodenal ulcer    GERD (gastroesophageal reflux disease)    Hemorrhage of rectum and anus 09/15/2013   Hyperlipidemia    Hypertension    Hypothyroidism    Leukopenia due to antineoplastic chemotherapy (HCC) 02/22/2017   Multiple myeloma 07/04/2011   Thyroid disease    TIA (transient ischemic attack) 03/27/2012   Transient diplopia 03/27/2012   Transient global amnesia 06/07/2015   Past Surgical History:  Procedure Laterality  Date   BONE MARROW TRANSPLANT  2011   for MM   CARDIOLITE STUDY  11/25/2003   NORMAL   TONSILLECTOMY      Allergies  Allergen Reactions   Atorvastatin Other (See Comments)    (LIPITOR) "Allergic," per [facility]   Rosuvastatin Other (See Comments)    (CRESTOR) Liver Disorder (adverse effects) and "Allergic," per [facility]   Beef-Derived Drug Products  Other (See Comments)    pescatarian    Chicken Protein Other (See Comments)    pescatarian   Pork-Derived Products Other (See Comments)    pescatarian    Septra [Sulfamethoxazole-Trimethoprim] Rash and Other (See Comments)    "Allergic," per [facility]    Outpatient Encounter Medications as of 10/22/2023  Medication Sig   acetaminophen (TYLENOL) 325 MG tablet Take 650 mg by mouth 3 (three) times daily.   Calcium Carb-Cholecalciferol (CALCIUM + VITAMIN D3) 500-5 MG-MCG TABS Take 1 tablet by mouth in the morning.   calcium carbonate (TUMS EX) 750 MG chewable tablet Chew 2 tablets by mouth 3 (three) times daily as needed (for indigestion).   Cholecalciferol (VITAMIN D3) 25 MCG (1000 UT) capsule Take 1,000 Units by mouth daily.   cyanocobalamin (VITAMIN B12) 1000 MCG tablet Take 1 tablet (1,000 mcg total) by mouth daily. (Patient taking differently: Take 1,000 mcg by mouth in the morning.)   dapsone 100 MG tablet Take 100 mg by mouth daily.   docusate sodium (COLACE) 100 MG capsule Take 100 mg by mouth daily as needed (for constipation).   escitalopram (LEXAPRO) 10 MG tablet Take 1 tablet (10 mg total) by mouth daily.   ferrous sulfate 325 (65 FE) MG EC tablet Take 325 mg by mouth 2 (two) times daily.   folic acid (FOLVITE) 1 MG tablet Take 1 tablet (1 mg total) by mouth daily. (Patient taking differently: Take 1 mg by mouth in the morning.)   levothyroxine (SYNTHROID) 125 MCG tablet Take 125 mcg by mouth daily before breakfast.   Menthol-Methyl Salicylate (MUSCLE RUB) 10-15 % CREA Apply 1 Application topically as needed for muscle pain.   methylphenidate (RITALIN) 5 MG tablet Take 2.5 mg by mouth in the morning.   midodrine (PROAMATINE) 2.5 MG tablet Take 1 tablet (2.5 mg total) by mouth 2 (two) times daily with a meal. (Patient taking differently: Take 2.5 mg by mouth 2 (two) times daily with a meal. Hold is SBP >150)   ondansetron (ZOFRAN-ODT) 4 MG disintegrating tablet Take 4 mg by mouth every 6  (six) hours as needed for nausea or vomiting.   pantoprazole (PROTONIX) 40 MG tablet Take 1 tablet (40 mg total) by mouth daily. (Patient taking differently: Take 40 mg by mouth daily with breakfast.)   Probiotic Product (UP4 PROBIOTICS WOMENS PO) Take 1 capsule by mouth daily. Hyperbiotics pro women wit D manos plus cranberry   tamsulosin (FLOMAX) 0.4 MG CAPS capsule Take 0.4 mg by mouth at bedtime.   traMADol (ULTRAM) 50 MG tablet Take 25-50 mg by mouth every 12 (twelve) hours as needed for moderate pain (pain score 4-6) or severe pain (pain score 7-10).   valACYclovir (VALTREX) 500 MG tablet Take 500 mg by mouth in the morning.   acetaminophen (TYLENOL) 500 MG tablet Take 2 tablets (1,000 mg total) by mouth every 8 (eight) hours. (Patient not taking: Reported on 10/22/2023)   [DISCONTINUED] prochlorperazine (COMPAZINE) 10 MG tablet Take 1 tablet (10 mg total) by mouth every 6 (six) hours as needed (Nausea or vomiting).   No facility-administered encounter  medications on file as of 10/22/2023.    Review of Systems  Constitutional:  Positive for activity change, appetite change, diaphoresis, fatigue and fever.  HENT:  Negative for congestion.   Eyes:  Negative for visual disturbance.  Respiratory:  Negative for cough, shortness of breath and wheezing.   Cardiovascular:  Positive for leg swelling. Negative for chest pain and palpitations.  Gastrointestinal:  Positive for abdominal pain. Negative for abdominal distention, anal bleeding, blood in stool, constipation, diarrhea, nausea, rectal pain and vomiting.  Genitourinary:  Negative for difficulty urinating, dysuria, frequency, hematuria and scrotal swelling.  Musculoskeletal:  Positive for gait problem.  Neurological:  Positive for weakness. Negative for dizziness, tremors, syncope, facial asymmetry, light-headedness, numbness and headaches.  Psychiatric/Behavioral:  Positive for confusion.     Immunization History  Administered Date(s)  Administered   DTaP / Hep B / IPV 05/04/2017   DTaP / HiB / IPV 03/06/2018   Fluad Quad(high Dose 65+) 06/09/2019, 06/25/2020, 07/25/2021, 05/30/2022   Fluad Trivalent(High Dose 65+) 08/04/2012, 07/03/2014, 06/16/2023   HIB (PRP-T) 05/04/2017   Hepatitis B, ADULT 03/14/2017, 03/06/2018   IPV 01/28/1998   Influenza, High Dose Seasonal PF 06/16/2018   Influenza,inj,Quad PF,6+ Mos 06/16/2013, 07/03/2014, 06/11/2015, 05/31/2017   Influenza-Unspecified 07/29/2012   Moderna Covid-19 Vaccine Bivalent Booster 30yrs & up 07/10/2023   PFIZER(Purple Top)SARS-COV-2 Vaccination 10/26/2019, 11/20/2019, 07/22/2020   Pfizer Covid-19 Vaccine Bivalent Booster 74yrs & up 06/27/2021   Pfizer(Comirnaty)Fall Seasonal Vaccine 12 years and older 07/05/2022   Pneumococcal Conjugate-13 10/15/2013, 05/04/2017, 03/06/2018   Pneumococcal Polysaccharide-23 06/16/2013   Respiratory Syncytial Virus Vaccine,Recomb Aduvanted(Arexvy) 05/30/2022   Td 02/10/1998, 07/03/2014   Pertinent  Health Maintenance Due  Topic Date Due   Colonoscopy  09/24/2023   INFLUENZA VACCINE  Completed      08/30/2022   11:01 AM 09/06/2022   10:00 AM 02/15/2023    1:29 PM 06/11/2023   11:20 AM 06/27/2023   10:37 AM  Fall Risk  Falls in the past year?   1 0 1  Was there an injury with Fall?   1 0 1  Fall Risk Category Calculator   3 0 3  (RETIRED) Patient Fall Risk Level Low fall risk Low fall risk     Patient at Risk for Falls Due to    History of fall(s) History of fall(s);Impaired balance/gait;Impaired mobility  Fall risk Follow up   Falls evaluation completed Falls evaluation completed Falls evaluation completed;Education provided;Falls prevention discussed   Functional Status Survey:    Vitals:   10/22/23 0852  BP: 109/69  Pulse: 88  Resp: 16  Temp: 98.1 F (36.7 C)  TempSrc: Temporal  SpO2: 90%  Weight: 178 lb 12.8 oz (81.1 kg)  Height: 5\' 7"  (1.702 m)   Body mass index is 28 kg/m. Physical Exam Constitutional:       Appearance: He is ill-appearing and diaphoretic.  HENT:     Head: Normocephalic and atraumatic.     Mouth/Throat:     Mouth: Mucous membranes are moist.     Pharynx: Oropharynx is clear.  Eyes:     Extraocular Movements: Extraocular movements intact.     Conjunctiva/sclera: Conjunctivae normal.     Pupils: Pupils are equal, round, and reactive to light.  Neck:     Thyroid: No thyromegaly.     Vascular: No JVD.     Trachea: No tracheal deviation.  Cardiovascular:     Rate and Rhythm: Normal rate and regular rhythm.     Heart sounds: No  murmur heard. Pulmonary:     Effort: Pulmonary effort is normal. No respiratory distress.     Breath sounds: Normal breath sounds. No wheezing.  Abdominal:     General: Bowel sounds are normal. There is no distension.     Palpations: Abdomen is soft. There is no mass.     Tenderness: There is abdominal tenderness (RUQ). There is guarding. There is no right CVA tenderness, left CVA tenderness or rebound.     Hernia: No hernia is present.  Musculoskeletal:     Cervical back: No rigidity.     Comments: BLE +1  Lymphadenopathy:     Cervical: No cervical adenopathy.  Skin:    General: Skin is warm.  Neurological:     General: No focal deficit present.     Mental Status: He is alert.     Cranial Nerves: No cranial nerve deficit.     Comments: Oriented to self, slow to respond verbally  Psychiatric:        Mood and Affect: Mood normal.     Labs reviewed: Recent Labs    05/23/23 0413 06/06/23 0849 06/16/23 0536 06/17/23 0517 06/27/23 0933 08/02/23 0825 08/16/23 0402 08/17/23 0333 08/30/23 0750 09/27/23 0827  NA 134*   < > 137   < > 139   < > 132* 137 140 138  K 3.7   < > 3.9   < > 3.7   < > 3.3* 3.6 3.9 4.4  CL 101   < > 104   < > 105   < > 98 104 105 104  CO2 25   < > 25   < > 25   < > 27 26 27 25   GLUCOSE 97   < > 92   < > 114*   < > 94 103* 102* 117*  BUN 21   < > 17   < > 16   < > 15 14 11 14   CREATININE 0.91   < > 0.92   < >  0.93   < > 0.82 0.81 0.86 0.89  CALCIUM 8.9   < > 8.8*   < > 9.9   < > 8.8* 8.7* 9.1 9.0  MG 2.1  --  2.3   < > 1.9   < > 2.2 2.2  --  2.2  PHOS 2.4*  --  3.7  --  2.8  --   --   --   --   --    < > = values in this interval not displayed.   Recent Labs    08/11/23 1118 08/30/23 0750 09/27/23 0827  AST 19 14* 14*  ALT 10 9 13   ALKPHOS 64 71 91  BILITOT 0.5 0.5 0.6  PROT 6.2* 5.9* 6.2*  ALBUMIN 3.3* 3.7 4.2   Recent Labs    08/24/23 1051 08/30/23 0750 09/27/23 0827  WBC 6.0 4.0 4.8  NEUTROABS 4.2 2.7 3.7  HGB 7.6* 7.8* 7.2*  HCT 24.3* 25.5* 24.5*  MCV 109.5* 104.9* 93.5  PLT 35* 44* 66*   Lab Results  Component Value Date   TSH 1.47 10/04/2023   Lab Results  Component Value Date   HGBA1C 4.3 (L) 05/06/2023   Lab Results  Component Value Date   CHOL 178 02/13/2023   HDL 42 02/13/2023   LDLCALC 113 02/13/2023   LDLDIRECT 155.8 11/13/2012   TRIG 167 (A) 02/13/2023   CHOLHDL 4 09/06/2017    Significant Diagnostic Results in last 30 days:  No results found.  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 Possible differential diagnoses include gallbladder or liver pathology and/or multiple myeloma . -Order abdominal ultrasound, CMP, CBC, amylase, and lipase. Continue tramadol increase frequency to q 6 hrs prn   Fever of Unknown Origin New onset, with history of multiple admissions for similar issue. COVID and flu rapid antigen tests negative. -Order CBC, CMP, UA, and C and S.  Altered Mental Status/Weakness New onset, with increased need for assistance with ADLs. Incontinence noted. -Continue with above workup and skilled care.  Hypoxia Worsening from baseline O2 requirement of 2-3 liters to 4 liters to maintain SpO2 at 90%. Chest x-ray did not show an acute process. -Continue current oxygen therapy.  Multiple Myeloma Stable at  last oncology follow-up on 09/27/2023. Hemoglobin 7.2, platelets 66K, WBC 4.8. Nplate and IVIG therapy previously stopped due to concern for fever. -If patient unable to attend scheduled oncology follow-up this week, fax labs for possible virtual visit. -If no concerning findings with the above workup consider myeloma as cause of rib pain, reach out to oncology.  Goals of Care Comfort-based, with DNR in place. Family wishes for treatment of conditions manageable at [facility]. -Respect patient's goals of care in all treatment decisions.  Family/ staff Communication: discussed with his wife [name] and both daughters   Labs/tests ordered:  CBC CMP Abd ultrasound Amylase lipase

## 2023-10-25 ENCOUNTER — Ambulatory Visit (HOSPITAL_BASED_OUTPATIENT_CLINIC_OR_DEPARTMENT_OTHER)
Admission: RE | Admit: 2023-10-25 | Discharge: 2023-10-25 | Disposition: A | Discharge status: Home / Self Care | Payer: Medicare Other | Source: Ambulatory Visit | Attending: Nurse Practitioner | Admitting: Nurse Practitioner

## 2023-10-25 ENCOUNTER — Other Ambulatory Visit: Payer: Self-pay | Admitting: Nurse Practitioner

## 2023-10-25 ENCOUNTER — Inpatient Hospital Stay: Payer: Medicare Other | Attending: Oncology

## 2023-10-25 ENCOUNTER — Non-Acute Institutional Stay (SKILLED_NURSING_FACILITY): Payer: Medicare Other | Admitting: Adult Health

## 2023-10-25 ENCOUNTER — Inpatient Hospital Stay: Payer: Medicare Other | Admitting: Nurse Practitioner

## 2023-10-25 ENCOUNTER — Encounter: Payer: Self-pay | Admitting: Nurse Practitioner

## 2023-10-25 ENCOUNTER — Encounter: Payer: Self-pay | Admitting: Adult Health

## 2023-10-25 VITALS — BP 97/58 | HR 79 | Temp 98.2°F | Resp 18 | Ht 67.0 in

## 2023-10-25 DIAGNOSIS — N3000 Acute cystitis without hematuria: Secondary | ICD-10-CM

## 2023-10-25 DIAGNOSIS — D649 Anemia, unspecified: Secondary | ICD-10-CM

## 2023-10-25 DIAGNOSIS — C9000 Multiple myeloma not having achieved remission: Secondary | ICD-10-CM

## 2023-10-25 DIAGNOSIS — D801 Nonfamilial hypogammaglobulinemia: Secondary | ICD-10-CM | POA: Diagnosis not present

## 2023-10-25 DIAGNOSIS — J9811 Atelectasis: Secondary | ICD-10-CM | POA: Diagnosis not present

## 2023-10-25 DIAGNOSIS — D696 Thrombocytopenia, unspecified: Secondary | ICD-10-CM

## 2023-10-25 LAB — CBC WITH DIFFERENTIAL (CANCER CENTER ONLY)
Abs Immature Granulocytes: 0.14 10*3/uL — ABNORMAL HIGH (ref 0.00–0.07)
Basophils Absolute: 0 10*3/uL (ref 0.0–0.1)
Basophils Relative: 0 %
Eosinophils Absolute: 0.4 10*3/uL (ref 0.0–0.5)
Eosinophils Relative: 6 %
HCT: 28.1 % — ABNORMAL LOW (ref 39.0–52.0)
Hemoglobin: 7.9 g/dL — ABNORMAL LOW (ref 13.0–17.0)
Immature Granulocytes: 2 %
Lymphocytes Relative: 4 %
Lymphs Abs: 0.3 10*3/uL — ABNORMAL LOW (ref 0.7–4.0)
MCH: 23.7 pg — ABNORMAL LOW (ref 26.0–34.0)
MCHC: 28.1 g/dL — ABNORMAL LOW (ref 30.0–36.0)
MCV: 84.1 fL (ref 80.0–100.0)
Monocytes Absolute: 0.1 10*3/uL (ref 0.1–1.0)
Monocytes Relative: 1 %
Neutro Abs: 5.5 10*3/uL (ref 1.7–7.7)
Neutrophils Relative %: 87 %
Platelet Count: 73 10*3/uL — ABNORMAL LOW (ref 150–400)
RBC: 3.34 MIL/uL — ABNORMAL LOW (ref 4.22–5.81)
RDW: 23.5 % — ABNORMAL HIGH (ref 11.5–15.5)
WBC Count: 6.4 10*3/uL (ref 4.0–10.5)
nRBC: 0 % (ref 0.0–0.2)

## 2023-10-25 LAB — SAMPLE TO BLOOD BANK

## 2023-10-25 MED ORDER — AMOXICILLIN-POT CLAVULANATE 875-125 MG PO TABS: 1.0000 | ORAL_TABLET | Freq: Two times a day (BID) | ORAL | Status: AC

## 2023-10-25 NOTE — Progress Notes (Signed)
 Location:  Medical Illustrator of Service:  SNF (31) Provider:   Bari America, ANP [facility] [phone]   Charlanne Fredia CROME, MD  Patient Care Team: Charlanne Fredia CROME, MD as PCP - General (Internal Medicine) True Genette Jama DOUGLAS, MD as Consulting Physician (Neurology) Guinevere File, MD as Consulting Physician (Internal Medicine) Jenel Carlin POUR, MD (Inactive) as Consulting Physician (Neurology) Hessie Kemps, DDS as Referring Physician (Oral Surgery) Cloretta Arley NOVAK, MD as Consulting Physician (Oncology) America Maus, NP as Nurse Practitioner (Nurse Practitioner)  Extended Emergency Contact Information Primary Emergency Contact: Vandeven,[name] Address: [address]          [place], [place] [address] [place] Home Phone: [phone] Relation: Spouse  Code Status:  DNR Goals of care: Advanced Directive information    10/25/2023    1:26 PM  Advanced Directives  Does Patient Have a Medical Advance Directive? Yes  Type of Advance Directive Out of facility DNR (pink MOST or yellow form);Living will;Healthcare Power of Attorney  Does patient want to make changes to medical advance directive? No - Patient declined  Would patient like information on creating a medical advance directive? No - Patient declined     Chief Complaint  Patient presents with   Acute Visit    F/u right rib/RUQ pain    History of Present Illness  He is a 73 year old male with multiple myeloma who presents for f/u with right rib and right upper quadrant pain.   He has a history of multiple myeloma, with his last oncology visit on September 27, 2023, showing stable disease but borderline low hemoglobin and platelet levels. Previous treatments with Nplate  and IVIG were discontinued due to concerns about fever. He also has a history of hypothyroidism, hypertension, hyperlipidemia, GERD, hiatal hernia, anemia, low platelets, and urinary  retention. Also MCI due to CART therapy and microvascular changes, hx of heme pos stools.   He has been experiencing right rib and right upper quadrant pain for the past week. Initially, he had fever, diaphoresis, and weakness, but the fever has resolved and the weakness has improved. He continues to use tramadol  for pain management, which he finds helpful. However, there is concern from his wife that tramadol  may be contributing to his increased confusion.  A KUB performed on October 23, 2023, showed a non-specific bowel gas pattern with no definite obstruction. An abdominal ultrasound on the same day revealed fatty infiltration of the liver and a contracted gallbladder without gallstones or duct dilation. A chest and right rib x-ray indicated a large hiatal hernia, Chilaiditi syndrome, and chronic right upper rib fractures with no acute cardiopulmonary process.  A urine culture showed greater than 100,000 colony-forming units of a gram-negative rod. His CBC on October 22, 2023, showed a white blood cell count of 7.5, hemoglobin of 7.3, and platelets of 52,000. His BMP revealed sodium at 136, BUN at 20, and creatinine at 0.9. Liver function tests showed AST at 9, ALT at 7, and ALP at 76.  He requires supplemental oxygen at four liters to maintain oxygen saturation above ninety percent, whereas his baseline requirement is two to three liters.  Results LABS UACNS: >100,000 CFU gram-negative rod CBC: WBC 7.5, Hb 7.3, PLT 52K (10/22/2023) BMP: Na 136, BUN 20, Cr 0.9 (10/22/2023) LFTs: AST 9, ALT 7, ALP 76 (10/22/2023)  RADIOLOGY KUB: Non-specific bowel gas pattern with no definite obstruction (10/23/2023) Abdominal ultrasound: Fatty infiltration of the liver, contracted gallbladder, no  gallstones or duct dilation (10/23/2023) Chest x-ray: Large hiatal hernia, chronic right upper rib fractures with no acute cardiopulmonary process Right rib x-ray: Chronic right upper rib fractures with no acute  cardiopulmonary process   Past Medical History:  Diagnosis Date   Anemia    Arthritis    Asthma    no treatment x 20 years   Depression    Double vision    occurs at times    Duodenal ulcer    GERD (gastroesophageal reflux disease)    Hemorrhage of rectum and anus 09/15/2013   Hyperlipidemia    Hypertension    Hypothyroidism    Leukopenia due to antineoplastic chemotherapy (HCC) 02/22/2017   Multiple myeloma 07/04/2011   Thyroid  disease    TIA (transient ischemic attack) 03/27/2012   Transient diplopia 03/27/2012   Transient global amnesia 06/07/2015   Past Surgical History:  Procedure Laterality Date   BONE MARROW TRANSPLANT  2011   for MM   CARDIOLITE STUDY  11/25/2003   NORMAL   TONSILLECTOMY      Allergies  Allergen Reactions   Atorvastatin Other (See Comments)    (LIPITOR) Allergic, per [facility]   Rosuvastatin Other (See Comments)    (CRESTOR) Liver Disorder (adverse effects) and Allergic, per [facility]   Beef-Derived Drug Products Other (See Comments)    pescatarian    Chicken Protein Other (See Comments)    pescatarian   Pork-Derived Products Other (See Comments)    pescatarian    Septra  [Sulfamethoxazole -Trimethoprim ] Rash and Other (See Comments)    Allergic, per [facility]    Outpatient Encounter Medications as of 10/25/2023  Medication Sig   acetaminophen  (TYLENOL ) 325 MG tablet Take 650 mg by mouth 3 (three) times daily.   Calcium  Carb-Cholecalciferol  (CALCIUM  + VITAMIN D3) 500-5 MG-MCG TABS Take 1 tablet by mouth in the morning.   calcium  carbonate (TUMS EX) 750 MG chewable tablet Chew 2 tablets by mouth 3 (three) times daily as needed (for indigestion).   Cholecalciferol  (VITAMIN D3) 25 MCG (1000 UT) capsule Take 1,000 Units by mouth daily.   cyanocobalamin  (VITAMIN B12) 1000 MCG tablet Take 1 tablet (1,000 mcg total) by mouth daily. (Patient taking differently: Take 1,000 mcg by mouth in the morning.)   dapsone  100 MG tablet Take 100 mg by mouth daily.    docusate sodium  (COLACE) 100 MG capsule Take 100 mg by mouth daily as needed (for constipation).   escitalopram  (LEXAPRO ) 10 MG tablet Take 1 tablet (10 mg total) by mouth daily.   ferrous sulfate  325 (65 FE) MG EC tablet Take 325 mg by mouth 2 (two) times daily.   folic acid  (FOLVITE ) 1 MG tablet Take 1 tablet (1 mg total) by mouth daily. (Patient taking differently: Take 1 mg by mouth in the morning.)   levothyroxine  (SYNTHROID ) 125 MCG tablet Take 125 mcg by mouth daily before breakfast.   Menthol -Methyl Salicylate  (MUSCLE RUB) 10-15 % CREA Apply 1 Application topically as needed for muscle pain.   methylphenidate  (RITALIN ) 5 MG tablet Take 2.5 mg by mouth in the morning.   midodrine  (PROAMATINE ) 2.5 MG tablet Take 1 tablet (2.5 mg total) by mouth 2 (two) times daily with a meal. (Patient taking differently: Take 2.5 mg by mouth 2 (two) times daily with a meal. Hold is SBP >150)   ondansetron  (ZOFRAN -ODT) 4 MG disintegrating tablet Take 4 mg by mouth every 6 (six) hours as needed for nausea or vomiting.   pantoprazole  (PROTONIX ) 40 MG tablet Take 1 tablet (40 mg  total) by mouth daily. (Patient taking differently: Take 40 mg by mouth daily with breakfast.)   Probiotic Product (UP4 PROBIOTICS WOMENS PO) Take 1 capsule by mouth daily. Hyperbiotics pro women wit D manos plus cranberry   tamsulosin  (FLOMAX ) 0.4 MG CAPS capsule Take 0.4 mg by mouth at bedtime.   traMADol  (ULTRAM ) 50 MG tablet Take 25-50 mg by mouth every 12 (twelve) hours as needed for moderate pain (pain score 4-6) or severe pain (pain score 7-10).   valACYclovir  (VALTREX ) 500 MG tablet Take 500 mg by mouth in the morning.   [DISCONTINUED] prochlorperazine  (COMPAZINE ) 10 MG tablet Take 1 tablet (10 mg total) by mouth every 6 (six) hours as needed (Nausea or vomiting).   No facility-administered encounter medications on file as of 10/25/2023.    Review of Systems  Constitutional:  Positive for activity change. Negative for appetite  change, chills, diaphoresis, fatigue, fever and unexpected weight change.  Respiratory:  Negative for cough, shortness of breath, wheezing and stridor.   Cardiovascular:  Positive for leg swelling. Negative for chest pain and palpitations.  Gastrointestinal:  Negative for abdominal distention, constipation, diarrhea, nausea and vomiting.  Genitourinary:  Negative for difficulty urinating and dysuria.  Musculoskeletal:  Positive for arthralgias and gait problem. Negative for back pain, joint swelling and myalgias.       Right rib pain and RUQ pain  Skin:  Negative for wound.  Neurological:  Negative for dizziness, seizures, syncope, facial asymmetry, speech difficulty, weakness and headaches.  Hematological:  Negative for adenopathy. Does not bruise/bleed easily.  Psychiatric/Behavioral:  Positive for confusion. Negative for agitation and behavioral problems.     Immunization History  Administered Date(s) Administered   DTaP / Hep B / IPV 05/04/2017   DTaP / HiB / IPV 03/06/2018   Fluad Quad(high Dose 65+) 06/09/2019, 06/25/2020, 07/25/2021, 05/30/2022   Fluad Trivalent(High Dose 65+) 08/04/2012, 07/03/2014, 06/16/2023   HIB (PRP-T) 05/04/2017   Hepatitis B, ADULT 03/14/2017, 03/06/2018   IPV 01/28/1998   Influenza, High Dose Seasonal PF 06/16/2018   Influenza,inj,Quad PF,6+ Mos 06/16/2013, 07/03/2014, 06/11/2015, 05/31/2017   Influenza-Unspecified 07/29/2012   Moderna Covid-19 Vaccine Bivalent Booster 77yrs & up 07/10/2023   PFIZER(Purple Top)SARS-COV-2 Vaccination 10/26/2019, 11/20/2019, 07/22/2020   Pfizer Covid-19 Vaccine Bivalent Booster 40yrs & up 06/27/2021   Pfizer(Comirnaty )Fall Seasonal Vaccine 12 years and older 07/05/2022   Pneumococcal Conjugate-13 10/15/2013, 05/04/2017, 03/06/2018   Pneumococcal Polysaccharide-23 06/16/2013   Respiratory Syncytial Virus Vaccine ,Recomb Aduvanted(Arexvy ) 05/30/2022   Td 02/10/1998, 07/03/2014   Pertinent  Health Maintenance Due  Topic  Date Due   Colonoscopy  09/24/2023   INFLUENZA VACCINE  Completed      08/30/2022   11:01 AM 09/06/2022   10:00 AM 02/15/2023    1:29 PM 06/11/2023   11:20 AM 06/27/2023   10:37 AM  Fall Risk  Falls in the past year?   1 0 1  Was there an injury with Fall?   1 0 1  Fall Risk Category Calculator   3 0 3  (RETIRED) Patient Fall Risk Level Low fall risk Low fall risk     Patient at Risk for Falls Due to    History of fall(s) History of fall(s);Impaired balance/gait;Impaired mobility  Fall risk Follow up   Falls evaluation completed Falls evaluation completed Falls evaluation completed;Education provided;Falls prevention discussed   Functional Status Survey:    Vitals:   10/25/23 1338  BP: 101/65  Pulse: 76  Temp: 98.2 F (36.8 C)  SpO2: 94%   There  is no height or weight on file to calculate BMI. Physical Exam Vitals and nursing note reviewed.  Constitutional:      General: He is not in acute distress.    Appearance: He is not diaphoretic.  HENT:     Head: Normocephalic and atraumatic.     Mouth/Throat:     Mouth: Mucous membranes are moist.     Pharynx: Oropharynx is clear.  Neck:     Thyroid : No thyromegaly.     Vascular: No JVD.     Trachea: No tracheal deviation.  Cardiovascular:     Rate and Rhythm: Normal rate and regular rhythm.     Heart sounds: No murmur heard. Pulmonary:     Effort: Pulmonary effort is normal. No respiratory distress.     Breath sounds: No wheezing. Rales: faint in bilateral bases.    Comments: Decreased bases Chest:     Chest wall: Tenderness (right later rib area) present.  Abdominal:     General: Bowel sounds are normal. There is no distension.     Palpations: Abdomen is soft.     Tenderness: There is abdominal tenderness (RUQ). There is guarding (Right rib area).  Musculoskeletal:     Comments: BLE edema +1  Lymphadenopathy:     Cervical: No cervical adenopathy.  Skin:    General: Skin is warm and dry.  Neurological:      General: No focal deficit present.     Mental Status: He is alert.     Cranial Nerves: No cranial nerve deficit.     Comments: Slightly more confused than baseline.      Labs reviewed: Recent Labs    05/23/23 0413 06/06/23 0849 06/16/23 0536 06/17/23 0517 06/27/23 0933 08/02/23 0825 08/16/23 0402 08/17/23 0333 08/30/23 0750 09/27/23 0827  NA 134*   < > 137   < > 139   < > 132* 137 140 138  K 3.7   < > 3.9   < > 3.7   < > 3.3* 3.6 3.9 4.4  CL 101   < > 104   < > 105   < > 98 104 105 104  CO2 25   < > 25   < > 25   < > 27 26 27 25   GLUCOSE 97   < > 92   < > 114*   < > 94 103* 102* 117*  BUN 21   < > 17   < > 16   < > 15 14 11 14   CREATININE 0.91   < > 0.92   < > 0.93   < > 0.82 0.81 0.86 0.89  CALCIUM  8.9   < > 8.8*   < > 9.9   < > 8.8* 8.7* 9.1 9.0  MG 2.1  --  2.3   < > 1.9   < > 2.2 2.2  --  2.2  PHOS 2.4*  --  3.7  --  2.8  --   --   --   --   --    < > = values in this interval not displayed.   Recent Labs    08/11/23 1118 08/30/23 0750 09/27/23 0827  AST 19 14* 14*  ALT 10 9 13   ALKPHOS 64 71 91  BILITOT 0.5 0.5 0.6  PROT 6.2* 5.9* 6.2*  ALBUMIN 3.3* 3.7 4.2   Recent Labs    08/30/23 0750 09/27/23 0827 10/25/23 1301  WBC 4.0 4.8 6.4  NEUTROABS 2.7 3.7 5.5  HGB 7.8* 7.2* 7.9*  HCT 25.5* 24.5* 28.1*  MCV 104.9* 93.5 84.1  PLT 44* 66* 73*   Lab Results  Component Value Date   TSH 1.47 10/04/2023   Lab Results  Component Value Date   HGBA1C 4.3 (L) 05/06/2023   Lab Results  Component Value Date   CHOL 178 02/13/2023   HDL 42 02/13/2023   LDLCALC 113 02/13/2023   LDLDIRECT 155.8 11/13/2012   TRIG 167 (A) 02/13/2023   CHOLHDL 4 09/06/2017    Significant Diagnostic Results in last 30 days:  No results found.  Assessment/Plan   Assessment and Plan  Urinary Tract Infection Positive urine culture with gram negative rods. -Start Augmentin  1 tablet BID for 7 days. -hx of urinary retention, bladder scanned on 2/3 no significant retention  noted. On flomax .   Right Rib and Upper Quadrant Pain Unclear etiology. Chronic right rib fractures noted on imaging. Pain managed with tramadol , lidocaine , and Tylenol . -Hold tramadol  for 48 hours due to concern for worsening cognition. -F/U with oncology for evaluation of potential relation to history of multiple myeloma.  Hypoxia Unclear etiology Has been worked up extensively at the hospital with prior CT scan of the chest Does have hx of atx and hiatal hernia, extensive chemotherapy Baseline 2-3L, currently requiring 4L to maintain SpO2 above 90%. -encourage physical activity -IS -Continue current oxygen therapy.  Cognitive Impairment Worsening confusion, possibly related to tramadol  use. -Hold tramadol  for 48 hours and monitor for improvement.  Family/ staff Communication: discussed with his wife [name], goal is to avoid hospitalization. DNR in place, treat in facility for acute issues.   Labs/tests ordered:  per oncology

## 2023-10-25 NOTE — Progress Notes (Signed)
 [facility] OFFICE PROGRESS NOTE   Diagnosis: Multiple myeloma  INTERVAL HISTORY:   [name] returns as scheduled.  He is accompanied by his wife.  He reports a fall late last week, striking the right chest.  He has pain in this area with certain movements and coughing.  X-ray was negative for acute rib fracture.  He has been taking tramadol .  His wife notes some confusion which she feels is likely due to the tramadol .  He denies shortness of breath.  She thinks he had a fever 1 or 2 days after the fall.  He reports a cough.  He denies bleeding.  His wife reports he was recently found to have bacteria in his urine and will be starting an antibiotic later today.  Objective:  Vital signs in last 24 hours:  Blood pressure (!) 97/58, pulse 79, temperature 98.2 F (36.8 C), resp. rate 18, height 5' 7 (1.702 m), SpO2 98%.    HEENT: No thrush or ulcers. Resp: Breath sounds are diminished at the right lung base, faint rales. Cardio: Regular rate and rhythm. GI: No hepatosplenomegaly. Vascular: Trace lower leg edema bilaterally. Musculoskeletal: Tender over right lateral mid chest region. Neuro: Alert.  Follows commands.  Answers questions appropriately.   Lab Results:  Lab Results  Component Value Date   WBC 6.4 10/25/2023   HGB 7.9 (L) 10/25/2023   HCT 28.1 (L) 10/25/2023   MCV 84.1 10/25/2023   PLT 73 (L) 10/25/2023   NEUTROABS 5.5 10/25/2023    Imaging:  DG Chest 1 View Result Date: 10/25/2023 CLINICAL DATA:  Cough, myeloma. EXAM: CHEST  1 VIEW COMPARISON:  Chest x-ray 08/15/2023.  Chest CT 08/11/2023. FINDINGS: There stable elevation of the right hemidiaphragm. There some strandy opacities in the right lung base. Large hiatal hernia is present. There is no pleural effusion or pneumothorax. The cardiomediastinal silhouette is within normal limits. No acute fractures are seen. IMPRESSION: 1. Strandy opacities in the right lung base may represent atelectasis or  infection. 2. Large hiatal hernia. Electronically Signed   By: Greig Pique M.D.   On: 10/25/2023 15:20    Medications: I have reviewed the patient's current medications.  Assessment/Plan: Multiple myeloma- IgG lambda  bortezomib  (subcutaneously), lenalidomide , and dexamethasone , with repeat bone marrow biopsy May of 2012 showing 10% plasmacytosis   (2) high-dose chemotherapy with BCNU and melphalan at [facility], followed by stem cell rescue July of 2012   (3) on zoledronic  acid started December of 2012, initially monthly, currently given every 3 months, most recent dose  12/07/2015   (4) low-dose lenalidomide  resumed April 2013, interrupted several times.  Resumed again on 02/19/2013, ata dose of 5 mg daily, 21 days on and 7 days off, later further reduced to 7 days on, 7 days off   (5) CNS symptoms and abnormal brain MRI extensively evaluated by neurology with no definitive diagnosis established   (6) rising M spike noted June 2014 but did not meet criteria for carfilzomib  study   (7) on lenalidomide  25 mg daily, 14 days on, 7 days off, starting 04/18/2013, interrupted December 2014 because of rash;             (a) resumed January 2015 at 10 mg/ day at 21 days on/ 7 days off             (b) starting 08/18/2014 decreased to 10 mg/ day 14 days on, 7 days off because of cytopenias             (  c) as of February 2016 was on 5 mg daily 7 days on 7 days off             (d) lenalidomide  discontinued December 2016 with evidence of disease progression   (8) transient global amnesia 05/29/2015, resolved without intervention   (9) starting PVD 10/25/2015 w ASA 325 thromboprophylaxis, valacyclovir  prophylaxis, last dose 12/17/2015             (a) pomalidomide  4 mg/d days 1-14             (b) bortezomib  sQ days 2,5,9,12 of each 21 day cycle             (c) dexamethasome 20 mg two days a week             (d) dexamethasone  bortezomib  and pomalidomide  discontinued late December 2018 with poor tolerance    (10) metapneumovirus pneumonia April 2017             (a) completing course of steroids and week of bactrim  mid April 2017   (11) status post second autologous transplant at [facility] 02/04/2016(preparatory regimen melphalan 200 mg/m)             (a) received twelve-month vaccinations 03/14/2017 (DPT, Haemophilus, Pneumovax 13, polio)             (b) 14 months injections 05/04/2016 include DTaP, Hib conjugate, HepB energex B 20 mcg/ml, Prevnar 13             (c) 24 month vaccines due at [facility] June 2019   (12) maintenance therapy started November 2017, consisting of             (a) bortezomib  1.3 mg/M2 every 14 days, first dose 07/27/2016             (b) pomalidomide  1 mg days 1-21 Q28 days, started 07/19/2016             (c) zolendronate monthly started 07/27/2016 (previously Q 3 months) however patient unable to tolerate, and changed back to q3 months in April, 2018             (d) Bortezomib  changed to monthly as of June 2018 because of tolerance issues, however discontinued after 09/14/2017 dose because of a rise in his M spike             (e) pomalidomide  held after 10/18/2017 in preparation for possible study at [facility] (venetoclax)--never resumed             (f) with numbers actually improved off treatment, resumed every 2-week bortezomib  12/25/2017             (g) changed to every 3-week bortezomib  as of 03/17/2019             (h) briefly on weekly treatments times 22 October 2020 due to increase in M spike             (I) maintenance therapy discontinued with evidence of progression   (13) bortezomib /daratumumab /dexamethasone  started 12/20/2020.             (a) Decadron  dose dropped to 10 mg day of and day following treatment starting May 2022             (b) day 8 bortezomib  omitted beginning with the June cycle per patient preference             (C) treatment changed to monthly daratumumab /Velcade /Decadron  beginning 06/27/2021-patient decision (14) weekly Velcade /Cytoxan  beginning  11/14/2021 (15) changed to Cytoxan /carfilzomib /Decadron   12/19/2021-treatment placed on hold 05/15/2022, carfilzomib  05/24/2022 and 05/31/2022 16.  Leukocyte a pheresis procedure at [facility] 06/15/2022 17.  Treatment resumed with Cytoxan /carfilzomib /Decadron  06/28/2022, last given 09/06/2022 18.  CAR-T 09/26/2022 with Carvykti 19.  CRS and ICANS following CAR-T therapy, status post a Decadron  taper completed 10/08/2022 20.  Presentation to the emergency room 10/30/2022 with failure to thrive and a fall  21.  Pancytopenia secondary to multiple myeloma and CAR-T therapy-improved 22.  Bone marrow biopsy 11/13/2022-mildly hypocellular bone marrow with relative erythroid hyperplasia and decreased megakaryocytes; no plasma cells identified by differential count or CD138 immunohistochemical stain; flow cytometry negative for a clonal plasma cell population.   bone marrow biopsy 05/10/2023-mildly hypercellular marrow with mild decrease in megakaryocytes, plasma cells not increased, absent iron stores, 46 XY, negative myeloma FISH panel, negative myeloid panel 23.  Cognitive impairment predating CAR-T therapy and worsened following CAR-T therapy 24.  Admission 05/05/2023 with Pseudomonas sepsis CTs chest, abdomen, and pelvis-right lung base atelectasis, no infiltrate 25.  Admission 05/20/2023 with hypoxia, altered mental status, and fever Infectious disease evaluation on hospital admission consistent with a urinary tract infection, urine culture positive for Pseudomonas aeruginosa 26.  Hypogammaglobulinemia secondary to multiple myeloma and CAR-T therapy Monthly IVIG 27.  Admission 06/14/2023 with severe anemia, hypotension, and report of a positive blood culture from 06/13/2023?  With gram-positive cocci in clusters, recent fall with a plain x-ray 06/14/2023 confirming left sixth and seventh rib fractures 28.  Thrombocytopenia-possibly bone marrow failure from extensive course of treatment, possible treatment related  MDS, immune thrombocytopenia, atypical infection 06/20/2023 CMV and parvovirus negative  weekly Nplate  beginning 06/20/2023 Nplate  dose escalated per protocol 07/06/2023 29.  Admission 08/04/2023 with a fever 30.  Admission 08/11/2023 with a fever  Disposition: Mr. Rehberg appears stable.  We reviewed the CBC.  Hemoglobin and platelet count both improved.  Plan to continue to follow with observation with transfusion support as needed.  We will obtain a chest x-ray today due to a recent fever, cough and abnormal lung exam.  Mr. Fairbank will return for lab and follow-up in 4 weeks.  We are available to see him sooner if needed.    Andre [name] ANP/GNP-BC   10/25/2023  3:28 PM   Addendum 4:03 PM-chest x-ray with question of atelectasis or infection right lung base.  I corresponded with Andre America, NP regarding the chest x-ray.  He will begin a course of Augmentin  today.  I also updated his wife.

## 2023-11-05 ENCOUNTER — Other Ambulatory Visit: Payer: Self-pay | Admitting: Internal Medicine

## 2023-11-05 MED ORDER — METHYLPHENIDATE HCL 5 MG PO TABS: 2.5000 mg | ORAL_TABLET | Freq: Every morning | ORAL | 0 refills | Status: DC

## 2023-11-20 ENCOUNTER — Inpatient Hospital Stay: Payer: Medicare Other | Admitting: Oncology

## 2023-11-20 ENCOUNTER — Inpatient Hospital Stay: Payer: Medicare Other

## 2023-11-26 ENCOUNTER — Telehealth: Payer: Self-pay

## 2023-11-26 ENCOUNTER — Inpatient Hospital Stay (HOSPITAL_BASED_OUTPATIENT_CLINIC_OR_DEPARTMENT_OTHER): Admitting: Nurse Practitioner

## 2023-11-26 ENCOUNTER — Other Ambulatory Visit: Payer: Self-pay

## 2023-11-26 ENCOUNTER — Inpatient Hospital Stay: Attending: Oncology

## 2023-11-26 ENCOUNTER — Encounter: Payer: Self-pay | Admitting: Nurse Practitioner

## 2023-11-26 VITALS — BP 96/60 | HR 83 | Temp 98.1°F | Resp 18 | Ht 67.0 in | Wt 178.0 lb

## 2023-11-26 DIAGNOSIS — C9000 Multiple myeloma not having achieved remission: Secondary | ICD-10-CM

## 2023-11-26 DIAGNOSIS — D61818 Other pancytopenia: Secondary | ICD-10-CM | POA: Insufficient documentation

## 2023-11-26 DIAGNOSIS — D649 Anemia, unspecified: Secondary | ICD-10-CM

## 2023-11-26 DIAGNOSIS — D696 Thrombocytopenia, unspecified: Secondary | ICD-10-CM

## 2023-11-26 DIAGNOSIS — D61810 Antineoplastic chemotherapy induced pancytopenia: Secondary | ICD-10-CM | POA: Diagnosis present

## 2023-11-26 DIAGNOSIS — D801 Nonfamilial hypogammaglobulinemia: Secondary | ICD-10-CM | POA: Insufficient documentation

## 2023-11-26 LAB — CBC WITH DIFFERENTIAL (CANCER CENTER ONLY)
Abs Immature Granulocytes: 0.12 10*3/uL — ABNORMAL HIGH (ref 0.00–0.07)
Basophils Absolute: 0 10*3/uL (ref 0.0–0.1)
Basophils Relative: 1 %
Eosinophils Absolute: 0.2 10*3/uL (ref 0.0–0.5)
Eosinophils Relative: 9 %
HCT: 20.7 % — ABNORMAL LOW (ref 39.0–52.0)
Hemoglobin: 6.2 g/dL — CL (ref 13.0–17.0)
Immature Granulocytes: 6 %
Lymphocytes Relative: 18 %
Lymphs Abs: 0.4 10*3/uL — ABNORMAL LOW (ref 0.7–4.0)
MCH: 23.3 pg — ABNORMAL LOW (ref 26.0–34.0)
MCHC: 30 g/dL (ref 30.0–36.0)
MCV: 77.8 fL — ABNORMAL LOW (ref 80.0–100.0)
Monocytes Absolute: 0.1 10*3/uL (ref 0.1–1.0)
Monocytes Relative: 5 %
Neutro Abs: 1.3 10*3/uL — ABNORMAL LOW (ref 1.7–7.7)
Neutrophils Relative %: 61 %
Platelet Count: 15 10*3/uL — ABNORMAL LOW (ref 150–400)
RBC Morphology: NONE SEEN
RBC: 2.66 MIL/uL — ABNORMAL LOW (ref 4.22–5.81)
RDW: 23.7 % — ABNORMAL HIGH (ref 11.5–15.5)
WBC Count: 2.1 10*3/uL — ABNORMAL LOW (ref 4.0–10.5)
nRBC: 0.9 % — ABNORMAL HIGH (ref 0.0–0.2)

## 2023-11-26 LAB — PREPARE RBC (CROSSMATCH)

## 2023-11-26 LAB — SAMPLE TO BLOOD BANK

## 2023-11-26 NOTE — Telephone Encounter (Signed)
 I contacted [name] to confirm the order, and I have scheduled a pick-up for the RBCS unit on November 27, 2023.

## 2023-11-26 NOTE — Telephone Encounter (Signed)
 I contacted Nurse Judeth Cornfield at [facility] to remind her to ensure that the patient retains his blue wristband for the blood transfusion scheduled for Tuesday and Wednesday.

## 2023-11-26 NOTE — Progress Notes (Signed)
 [facility] OFFICE PROGRESS NOTE   Diagnosis: Multiple myeloma  INTERVAL HISTORY:   Andre [name] returns as scheduled.  He reports a fall a few days ago.  Energy level varies.  No fever.  No bleeding.  Cough is better.  He denies shortness of breath.  He continues supplemental oxygen.  Objective:  Vital signs in last 24 hours:  Blood pressure 96/60, pulse 83, temperature 98.1 F (36.7 C), temperature source Temporal, resp. rate 18, height 5\' 7"  (1.702 m), weight 178 lb (80.7 kg), SpO2 99%.    HEENT: No thrush or ulcers. Resp: Decreased breath sounds right base.  No respiratory distress. Cardio: Regular rate and rhythm. GI: No hepatosplenomegaly. Vascular: Trace lower leg edema bilaterally.   Lab Results:  Lab Results  Component Value Date   WBC 6.4 10/25/2023   HGB 7.9 (L) 10/25/2023   HCT 28.1 (L) 10/25/2023   MCV 84.1 10/25/2023   PLT 73 (L) 10/25/2023   NEUTROABS 5.5 10/25/2023    Imaging:  No results found.  Medications: I have reviewed the patient's current medications.  Assessment/Plan: Multiple myeloma- IgG lambda  bortezomib (subcutaneously), lenalidomide, and dexamethasone, with repeat bone marrow biopsy May of 2012 showing 10% plasmacytosis   (2) high-dose chemotherapy with BCNU and melphalan at [facility], followed by stem cell rescue July of 2012   (3) on zoledronic acid started December of 2012, initially monthly, currently given every 3 months, most recent dose  12/07/2015   (4) low-dose lenalidomide resumed April 2013, interrupted several times.  Resumed again on 02/19/2013, ata dose of 5 mg daily, 21 days on and 7 days off, later further reduced to 7 days on, 7 days off   (5) CNS symptoms and abnormal brain MRI extensively evaluated by neurology with no definitive diagnosis established   (6) rising M spike noted June 2014 but did not meet criteria for carfilzomib study   (7) on lenalidomide 25 mg daily, 14 days on, 7 days off, starting  04/18/2013, interrupted December 2014 because of rash;             (a) resumed January 2015 at 10 mg/ day at 21 days on/ 7 days off             (b) starting 08/18/2014 decreased to 10 mg/ day 14 days on, 7 days off because of cytopenias             (c) as of February 2016 was on 5 mg daily 7 days on 7 days off             (d) lenalidomide discontinued December 2016 with evidence of disease progression   (8) transient global amnesia 05/29/2015, resolved without intervention   (9) starting PVD 10/25/2015 w ASA 325 thromboprophylaxis, valacyclovir prophylaxis, last dose 12/17/2015             (a) pomalidomide 4 mg/d days 1-14             (b) bortezomib sQ days 2,5,9,12 of each 21 day cycle             (c) dexamethasome 20 mg two days a week             (d) dexamethasone bortezomib and pomalidomide discontinued late December 2018 with poor tolerance   (10) metapneumovirus pneumonia April 2017             (a) completing course of steroids and week of bactrim mid April 2017   (11) status post second  autologous transplant at [facility] 02/04/2016(preparatory regimen melphalan 200 mg/m)             (a) received twelve-month vaccinations 03/14/2017 (DPT, Haemophilus, Pneumovax 13, polio)             (b) 14 months injections 05/04/2016 include DTaP, Hib conjugate, HepB energex B 20 mcg/ml, Prevnar 13             (c) 24 month vaccines due at [facility] June 2019   (12) maintenance therapy started November 2017, consisting of             (a) bortezomib 1.3 mg/M2 every 14 days, first dose 07/27/2016             (b) pomalidomide 1 mg days 1-21 Q28 days, started 07/19/2016             (c) zolendronate monthly started 07/27/2016 (previously Q 3 months) however patient unable to tolerate, and changed back to q3 months in April, 2018             (d) Bortezomib changed to monthly as of June 2018 because of tolerance issues, however discontinued after 09/14/2017 dose because of a rise in his M spike             (e)  pomalidomide held after 10/18/2017 in preparation for possible study at [facility] (venetoclax)--never resumed             (f) with numbers actually improved off treatment, resumed every 2-week bortezomib 12/25/2017             (g) changed to every 3-week bortezomib as of 03/17/2019             (h) briefly on weekly treatments times 22 October 2020 due to increase in M spike             (I) maintenance therapy discontinued with evidence of progression   (13) bortezomib/daratumumab/dexamethasone started 12/20/2020.             (a) Decadron dose dropped to 10 mg day of and day following treatment starting May 2022             (b) day 8 bortezomib omitted beginning with the June cycle per patient preference             (C) treatment changed to monthly daratumumab/Velcade/Decadron beginning 06/27/2021-patient decision (14) weekly Velcade/Cytoxan beginning 11/14/2021 (15) changed to Cytoxan/carfilzomib/Decadron 12/19/2021-treatment placed on hold 05/15/2022, carfilzomib 05/24/2022 and 05/31/2022 16.  Leukocyte a pheresis procedure at [facility] 06/15/2022 17.  Treatment resumed with Cytoxan/carfilzomib/Decadron 06/28/2022, last given 09/06/2022 18.  CAR-T 09/26/2022 with Carvykti 19.  CRS and ICANS following CAR-T therapy, status post a Decadron taper completed 10/08/2022 20.  Presentation to the emergency room 10/30/2022 with failure to thrive and a fall  21.  Pancytopenia secondary to multiple myeloma and CAR-T therapy-improved 22.  Bone marrow biopsy 11/13/2022-mildly hypocellular bone marrow with relative erythroid hyperplasia and decreased megakaryocytes; no plasma cells identified by differential count or CD138 immunohistochemical stain; flow cytometry negative for a clonal plasma cell population.   bone marrow biopsy 05/10/2023-mildly hypercellular marrow with mild decrease in megakaryocytes, plasma cells not increased, absent iron stores, 46 XY, negative myeloma FISH panel, negative myeloid panel 23.   Cognitive impairment predating CAR-T therapy and worsened following CAR-T therapy 24.  Admission 05/05/2023 with Pseudomonas sepsis CTs chest, abdomen, and pelvis-right lung base atelectasis, no infiltrate 25.  Admission 05/20/2023 with hypoxia, altered mental status, and fever Infectious disease evaluation  on hospital admission consistent with a urinary tract infection, urine culture positive for Pseudomonas aeruginosa 26.  Hypogammaglobulinemia secondary to multiple myeloma and CAR-T therapy Monthly IVIG 27.  Admission 06/14/2023 with severe anemia, hypotension, and report of a positive blood culture from 06/13/2023?  With gram-positive cocci in clusters, recent fall with a plain x-ray 06/14/2023 confirming left sixth and seventh rib fractures 28.  Thrombocytopenia-possibly bone marrow failure from extensive course of treatment, possible treatment related MDS, immune thrombocytopenia, atypical infection 06/20/2023 CMV and parvovirus negative  weekly Nplate beginning 06/20/2023 Nplate dose escalated per protocol 07/06/2023 29.  Admission 08/04/2023 with a fever 30.  Admission 08/11/2023 with a fever  Disposition: Andre [name] has a history of multiple myeloma.  He is accompanied by his daughter.  He is being followed with observation, transfusion support as needed.  CBC from today shows progressive pancytopenia.  Precautions reviewed.  They understand to contact the office with fever, chills, other signs of infection, bleeding.  They would like to proceed with a blood transfusion.  He does not want to be hospitalized.  Risk/benefits of a blood transfusion discussed.  They agree to proceed.  We are scheduling him for 2 units of blood as an outpatient on 11/27/2023.  He will return for a follow-up CBC and ferritin in 1 week.  Lab and office visit in 2 weeks.  We are available to see him sooner if needed.  Plan reviewed with Dr. Truett Perna.    Lonna Cobb ANP/GNP-BC   11/26/2023  1:49 PM

## 2023-11-27 ENCOUNTER — Telehealth: Payer: Self-pay

## 2023-11-27 ENCOUNTER — Other Ambulatory Visit: Payer: Self-pay

## 2023-11-27 ENCOUNTER — Inpatient Hospital Stay

## 2023-11-27 DIAGNOSIS — D649 Anemia, unspecified: Secondary | ICD-10-CM

## 2023-11-27 DIAGNOSIS — D61810 Antineoplastic chemotherapy induced pancytopenia: Secondary | ICD-10-CM | POA: Diagnosis not present

## 2023-11-27 LAB — TYPE AND SCREEN
ABO/RH(D): A POS
Antibody Screen: NEGATIVE
Unit division: 0

## 2023-11-27 LAB — BPAM RBC
Blood Product Expiration Date: 202504052359
ISSUE DATE / TIME: 202503111115
Unit Type and Rh: 202504052359
Unit Type and Rh: 5100

## 2023-11-27 LAB — PREPARE RBC (CROSSMATCH)

## 2023-11-27 NOTE — Telephone Encounter (Signed)
 I contacted Andre [name], and they confirmed that the order has been placed and the pick-up has been scheduled.

## 2023-11-27 NOTE — Progress Notes (Signed)
 Patient came in today for 1 Unit of PRBC. Patient did not have his Blue Blood Band on and all attempts to find it was futile. Patient was retyped and crossed for 2 Units of PRBCs tomorrow 11/28/2023 at 0830. Patient's wife and care giver present at time of Lab draw and are aware to ensure this bracelet stays on patient till his appointment tomorrow. No blood product given today. Misty Stanley, NP made aware of this situation. 1 Unit of blood delivered from [facility] blood bank was picked up by DASH courier at 1330 to be returned to the blood bank.

## 2023-11-28 ENCOUNTER — Telehealth: Payer: Self-pay | Admitting: Nurse Practitioner

## 2023-11-28 ENCOUNTER — Inpatient Hospital Stay

## 2023-11-28 DIAGNOSIS — D649 Anemia, unspecified: Secondary | ICD-10-CM

## 2023-11-28 DIAGNOSIS — D61810 Antineoplastic chemotherapy induced pancytopenia: Secondary | ICD-10-CM | POA: Diagnosis not present

## 2023-11-28 MED ORDER — SODIUM CHLORIDE 0.9% IV SOLUTION
250.0000 mL | INTRAVENOUS | Status: DC
  Administered 2023-11-28: 100 mL via INTRAVENOUS

## 2023-11-28 NOTE — Patient Instructions (Signed)

## 2023-11-28 NOTE — Telephone Encounter (Signed)
 Left patient a vm regarding upcoming appointment

## 2023-11-29 ENCOUNTER — Telehealth: Payer: Self-pay | Admitting: *Deleted

## 2023-11-29 LAB — BPAM RBC
Blood Product Expiration Date: 202504022359
Blood Product Unit Number: 202504022359
ISSUE DATE / TIME: 202503120702
PRODUCT CODE: 202503120702
PRODUCT CODE: 202504022359
Unit Type and Rh: 6200
Unit Type and Rh: 202504022359
Unit Type and Rh: 6200
Unit Type and Rh: 6200

## 2023-11-29 LAB — TYPE AND SCREEN
ABO/RH(D): A POS
Antibody Screen: NEGATIVE
Unit division: 0
Unit division: 0

## 2023-11-29 NOTE — Telephone Encounter (Signed)
 Mrs. Stainback called to inquire if his 3/19 lab could be done at SNF since it is so difficult to get him here?  OK per Dr. Truett Perna: Faxed orders to SNF att: Judeth Cornfield to draw CBC/diff and ferritin on 3/19 and fax results to office. Nurse Judeth Cornfield said it would be no problem. Also faxed copy of his appointment calendar to facility.

## 2023-12-03 ENCOUNTER — Non-Acute Institutional Stay (SKILLED_NURSING_FACILITY): Payer: Self-pay | Admitting: Internal Medicine

## 2023-12-03 DIAGNOSIS — E039 Hypothyroidism, unspecified: Secondary | ICD-10-CM

## 2023-12-03 DIAGNOSIS — C9000 Multiple myeloma not having achieved remission: Secondary | ICD-10-CM

## 2023-12-03 DIAGNOSIS — R531 Weakness: Secondary | ICD-10-CM

## 2023-12-03 DIAGNOSIS — I951 Orthostatic hypotension: Secondary | ICD-10-CM

## 2023-12-03 DIAGNOSIS — R296 Repeated falls: Secondary | ICD-10-CM

## 2023-12-03 DIAGNOSIS — D61818 Other pancytopenia: Secondary | ICD-10-CM | POA: Diagnosis not present

## 2023-12-03 DIAGNOSIS — Z7189 Other specified counseling: Secondary | ICD-10-CM

## 2023-12-03 DIAGNOSIS — R4189 Other symptoms and signs involving cognitive functions and awareness: Secondary | ICD-10-CM

## 2023-12-05 ENCOUNTER — Other Ambulatory Visit

## 2023-12-05 ENCOUNTER — Other Ambulatory Visit: Payer: Self-pay | Admitting: Nurse Practitioner

## 2023-12-05 ENCOUNTER — Encounter: Payer: Self-pay | Admitting: Internal Medicine

## 2023-12-05 DIAGNOSIS — D649 Anemia, unspecified: Secondary | ICD-10-CM

## 2023-12-05 DIAGNOSIS — C9000 Multiple myeloma not having achieved remission: Secondary | ICD-10-CM

## 2023-12-05 DIAGNOSIS — D696 Thrombocytopenia, unspecified: Secondary | ICD-10-CM

## 2023-12-05 NOTE — Progress Notes (Signed)
 Location:  Medical illustrator of Service:  SNF (31)  Provider:   Code Status: DNR Goals of Care:     10/25/2023    1:26 PM  Advanced Directives  Does Patient Have a Medical Advance Directive? Yes  Type of Advance Directive Out of facility DNR (pink MOST or yellow form);Living will;Healthcare Power of Attorney  Does patient want to make changes to medical advance directive? No - Patient declined  Would patient like information on creating a medical advance directive? No - Patient declined     Chief Complaint  Patient presents with   Care Management    HPI: Patient is a 73 y.o. male seen today for medical management of chronic diseases.    Patient Lives in SNF in [place] Patient has h/o Multiple Myeloma diagnosed in 2012 Post CART therapy per Dr Marissa Calamity on 01/24  In Clinical Remission On IVIG monthly per [facility] due to CART and Chronic Microvascular changes On Ritalin Recurent falls due to Unstable Gait Anemia with Occult positive stools Pancytopenia due to BM failure   Patient has had number of admissions in the hospital due to Anemia and hypertension in 06/14/23 Sepsis in 05/20/23 Pneumonia in 08/24  SIRS in 07/2023  Fever with Negative work up in 08/11/23   Patient's wife and daughters had decided to not pursue any more treatment.  His Nplate and monthly IVIG was discontinued by Dr. Truett Perna  But recently patient had a blood work done in his office which showed a hemoglobin of 6.2 with platelet counts of 15 Patient got 1 unit of blood. He he continues to feel little weak.  Has had number of falls over the weekend as he tries to get up and walk without calling for assist.  But since his infusion patient says that he feels little bit better. Wt Readings from Last 3 Encounters:  12/03/23 180 lb (81.6 kg)  11/26/23 178 lb (80.7 kg)  10/22/23 178 lb 12.8 oz (81.1 kg)    No other issues No Behaviors Appetite is good    Past Medical History:   Diagnosis Date   Anemia    Arthritis    Asthma    no treatment x 20 years   Depression    Double vision    occurs at times    Duodenal ulcer    GERD (gastroesophageal reflux disease)    Hemorrhage of rectum and anus 09/15/2013   Hyperlipidemia    Hypertension    Hypothyroidism    Leukopenia due to antineoplastic chemotherapy (HCC) 02/22/2017   Multiple myeloma 07/04/2011   Thyroid disease    TIA (transient ischemic attack) 03/27/2012   Transient diplopia 03/27/2012   Transient global amnesia 06/07/2015    Past Surgical History:  Procedure Laterality Date   BONE MARROW TRANSPLANT  2011   for MM   CARDIOLITE STUDY  11/25/2003   NORMAL   TONSILLECTOMY      Allergies  Allergen Reactions   Atorvastatin Other (See Comments)    (LIPITOR) "Allergic," per [facility]   Rosuvastatin Other (See Comments)    (CRESTOR) Liver Disorder (adverse effects) and "Allergic," per [facility]   Beef-Derived Drug Products Other (See Comments)    pescatarian    Chicken Protein Other (See Comments)    pescatarian   Pork-Derived Products Other (See Comments)    pescatarian    Septra [Sulfamethoxazole-Trimethoprim] Rash and Other (See Comments)    "Allergic," per [facility]    Outpatient Encounter Medications as of 12/03/2023  Medication Sig   acetaminophen (TYLENOL) 325 MG tablet Take 650 mg by mouth 3 (three) times daily.   Calcium Carb-Cholecalciferol (CALCIUM + VITAMIN D3) 500-5 MG-MCG TABS Take 1 tablet by mouth in the morning.   calcium carbonate (TUMS EX) 750 MG chewable tablet Chew 2 tablets by mouth 3 (three) times daily as needed (for indigestion).   Cholecalciferol (VITAMIN D3) 25 MCG (1000 UT) capsule Take 1,000 Units by mouth daily.   cyanocobalamin (VITAMIN B12) 1000 MCG tablet Take 1 tablet (1,000 mcg total) by mouth daily. (Patient taking differently: Take 1,000 mcg by mouth in the morning.)   dapsone 100 MG tablet Take 100 mg by mouth daily.   docusate sodium (COLACE) 100 MG capsule Take 100 mg  by mouth daily as needed (for constipation).   escitalopram (LEXAPRO) 10 MG tablet Take 1 tablet (10 mg total) by mouth daily.   ferrous sulfate 325 (65 FE) MG EC tablet Take 325 mg by mouth 2 (two) times daily.   folic acid (FOLVITE) 1 MG tablet Take 1 tablet (1 mg total) by mouth daily. (Patient taking differently: Take 1 mg by mouth in the morning.)   levothyroxine (SYNTHROID) 125 MCG tablet Take 125 mcg by mouth daily before breakfast.   Menthol-Methyl Salicylate (MUSCLE RUB) 10-15 % CREA Apply 1 Application topically as needed for muscle pain.   methylphenidate (RITALIN) 5 MG tablet Take 0.5 tablets (2.5 mg total) by mouth in the morning.   midodrine (PROAMATINE) 2.5 MG tablet Take 1 tablet (2.5 mg total) by mouth 2 (two) times daily with a meal. (Patient taking differently: Take 2.5 mg by mouth 2 (two) times daily with a meal. Hold is SBP >150)   ondansetron (ZOFRAN-ODT) 4 MG disintegrating tablet Take 4 mg by mouth every 6 (six) hours as needed for nausea or vomiting.   pantoprazole (PROTONIX) 40 MG tablet Take 1 tablet (40 mg total) by mouth daily. (Patient taking differently: Take 40 mg by mouth daily with breakfast.)   Probiotic Product (UP4 PROBIOTICS WOMENS PO) Take 1 capsule by mouth daily. Hyperbiotics pro women wit D manos plus cranberry   tamsulosin (FLOMAX) 0.4 MG CAPS capsule Take 0.4 mg by mouth at bedtime.   traMADol (ULTRAM) 50 MG tablet Take 25-50 mg by mouth every 12 (twelve) hours as needed for moderate pain (pain score 4-6) or severe pain (pain score 7-10).   valACYclovir (VALTREX) 500 MG tablet Take 500 mg by mouth in the morning.   No facility-administered encounter medications on file as of 12/03/2023.    Review of Systems:  Review of Systems  Constitutional:  Positive for activity change. Negative for appetite change and unexpected weight change.  HENT: Negative.    Respiratory:  Positive for shortness of breath. Negative for cough.   Cardiovascular:  Negative for  leg swelling.  Gastrointestinal:  Negative for constipation.  Genitourinary:  Negative for frequency.  Musculoskeletal:  Positive for gait problem. Negative for arthralgias and myalgias.  Skin: Negative.  Negative for rash.  Neurological:  Positive for weakness. Negative for dizziness.  Psychiatric/Behavioral:  Positive for confusion. Negative for sleep disturbance.   All other systems reviewed and are negative.   Health Maintenance  Topic Date Due   Medicare Annual Wellness (AWV)  Never done   Pneumonia Vaccine 63+ Years old (3 of 3 - PPSV23 or PCV20) 06/16/2018   COVID-19 Vaccine (7 - 2024-25 season) 09/04/2023   Colonoscopy  09/24/2023   Zoster Vaccines- Shingrix (1 of 2) 02/18/2024 (Originally 06/06/1970)  DTaP/Tdap/Td (5 - Tdap) 03/06/2028   INFLUENZA VACCINE  Completed   HPV VACCINES  Aged Out   Hepatitis C Screening  Discontinued    Physical Exam: Vitals:   12/03/23 2128  BP: 107/65  Pulse: 69  Resp: 20  Temp: (!) 97.5 F (36.4 C)  Weight: 180 lb (81.6 kg)   Body mass index is 28.19 kg/m. Physical Exam Vitals reviewed.  Constitutional:      Appearance: Normal appearance.  HENT:     Head: Normocephalic.     Nose: Nose normal.     Mouth/Throat:     Mouth: Mucous membranes are moist.     Pharynx: Oropharynx is clear.  Eyes:     Pupils: Pupils are equal, round, and reactive to light.  Cardiovascular:     Rate and Rhythm: Normal rate and regular rhythm.     Pulses: Normal pulses.     Heart sounds: No murmur heard. Pulmonary:     Effort: Pulmonary effort is normal. No respiratory distress.     Breath sounds: Normal breath sounds. No rales.  Abdominal:     General: Abdomen is flat. Bowel sounds are normal.     Palpations: Abdomen is soft.  Musculoskeletal:        General: No swelling.     Cervical back: Neck supple.  Skin:    General: Skin is warm.  Neurological:     General: No focal deficit present.     Mental Status: He is alert.  Psychiatric:         Mood and Affect: Mood normal.        Thought Content: Thought content normal.     Labs reviewed: Basic Metabolic Panel: Recent Labs    05/20/23 2320 05/22/23 0420 05/23/23 0413 06/06/23 0849 06/16/23 0536 06/17/23 0517 06/27/23 0933 08/02/23 0825 08/09/23 0000 08/11/23 1118 08/16/23 0402 08/17/23 0333 08/30/23 0750 09/27/23 0827 10/04/23 0000  NA 131*   < > 134*   < > 137   < > 139   < >  --    < > 132* 137 140 138  --   K 3.7   < > 3.7   < > 3.9   < > 3.7   < >  --    < > 3.3* 3.6 3.9 4.4  --   CL 98   < > 101   < > 104   < > 105   < >  --    < > 98 104 105 104  --   CO2 23   < > 25   < > 25   < > 25   < >  --    < > 27 26 27 25   --   GLUCOSE 120*   < > 97   < > 92   < > 114*   < >  --    < > 94 103* 102* 117*  --   BUN 14   < > 21   < > 17   < > 16   < >  --    < > 15 14 11 14   --   CREATININE 1.00   < > 0.91   < > 0.92   < > 0.93   < >  --    < > 0.82 0.81 0.86 0.89  --   CALCIUM 8.9   < > 8.9   < > 8.8*   < > 9.9   < >  --    < >  8.8* 8.7* 9.1 9.0  --   MG 1.9   < > 2.1  --  2.3   < > 1.9  --   --    < > 2.2 2.2  --  2.2  --   PHOS  --    < > 2.4*  --  3.7  --  2.8  --   --   --   --   --   --   --   --   TSH 0.173*  --   --   --   --   --   --   --  6.02*  --   --   --   --   --  1.47   < > = values in this interval not displayed.   Liver Function Tests: Recent Labs    08/11/23 1118 08/30/23 0750 09/27/23 0827  AST 19 14* 14*  ALT 10 9 13   ALKPHOS 64 71 91  BILITOT 0.5 0.5 0.6  PROT 6.2* 5.9* 6.2*  ALBUMIN 3.3* 3.7 4.2   Recent Labs    05/05/23 1901 05/20/23 1643  LIPASE 30 26   Recent Labs    05/20/23 1825  AMMONIA 26   CBC: Recent Labs    09/27/23 0827 10/25/23 1301 11/26/23 1303  WBC 4.8 6.4 2.1*  NEUTROABS 3.7 5.5 1.3*  HGB 7.2* 7.9* 6.2*  HCT 24.5* 28.1* 20.7*  MCV 93.5 84.1 77.8*  PLT 66* 73* 15*   Lipid Panel: Recent Labs    02/13/23 0000  CHOL 178  HDL 42  LDLCALC 113  TRIG 621*   Lab Results  Component Value Date    HGBA1C 4.3 (L) 05/06/2023    Procedures since last visit: No results found.  Assessment/Plan 1. Pancytopenia (HCC) (Primary) I discussed with the wife today about his pancytopenia. Patient is going to get blood work done next few days. Does have appointment with Dr. Truett Perna Patient is comfort care.  They want to wait for  hospice at this point.  There aware of his poor prognosis and bone marrow failure.   2. Recurrent falls Discussed with the nurses.  Patient is unable to do therapy due to weakness and low hemoglobin. Falls are due to his poor cognition and Frailty  3. Multiple myeloma, remission status unspecified (HCC) On dapsone and Valtrex for prophylaxis 4 orthostatic hypotension On low-dose of midodrine 5 Cognitive impairment due to vascular disease and CART Therapy   Labs/tests ordered:  Repeat Labs Pending  Next appt:  Visit date not found

## 2023-12-06 ENCOUNTER — Encounter: Payer: Self-pay | Admitting: Nurse Practitioner

## 2023-12-06 ENCOUNTER — Institutional Professional Consult (permissible substitution): Payer: Medicare Other | Admitting: Psychology

## 2023-12-06 ENCOUNTER — Inpatient Hospital Stay (HOSPITAL_BASED_OUTPATIENT_CLINIC_OR_DEPARTMENT_OTHER): Admitting: Nurse Practitioner

## 2023-12-06 ENCOUNTER — Telehealth: Payer: Self-pay | Admitting: Nurse Practitioner

## 2023-12-06 ENCOUNTER — Inpatient Hospital Stay

## 2023-12-06 ENCOUNTER — Ambulatory Visit: Payer: Self-pay

## 2023-12-06 VITALS — BP 101/58 | HR 78 | Temp 98.2°F | Resp 18 | Ht 67.0 in | Wt 170.0 lb

## 2023-12-06 DIAGNOSIS — C9000 Multiple myeloma not having achieved remission: Secondary | ICD-10-CM | POA: Diagnosis not present

## 2023-12-06 DIAGNOSIS — D649 Anemia, unspecified: Secondary | ICD-10-CM

## 2023-12-06 DIAGNOSIS — D61810 Antineoplastic chemotherapy induced pancytopenia: Secondary | ICD-10-CM | POA: Diagnosis not present

## 2023-12-06 DIAGNOSIS — D696 Thrombocytopenia, unspecified: Secondary | ICD-10-CM

## 2023-12-06 DIAGNOSIS — D801 Nonfamilial hypogammaglobulinemia: Secondary | ICD-10-CM

## 2023-12-06 LAB — CBC WITH DIFFERENTIAL (CANCER CENTER ONLY)
Abs Immature Granulocytes: 0.14 10*3/uL — ABNORMAL HIGH (ref 0.00–0.07)
Basophils Absolute: 0 10*3/uL (ref 0.0–0.1)
Basophils Relative: 1 %
Eosinophils Absolute: 0.2 10*3/uL (ref 0.0–0.5)
Eosinophils Relative: 14 %
HCT: 27.5 % — ABNORMAL LOW (ref 39.0–52.0)
Hemoglobin: 8.4 g/dL — ABNORMAL LOW (ref 13.0–17.0)
Immature Granulocytes: 10 %
Lymphocytes Relative: 23 %
Lymphs Abs: 0.3 10*3/uL — ABNORMAL LOW (ref 0.7–4.0)
MCH: 24.3 pg — ABNORMAL LOW (ref 26.0–34.0)
MCHC: 30.5 g/dL (ref 30.0–36.0)
MCV: 79.7 fL — ABNORMAL LOW (ref 80.0–100.0)
Monocytes Absolute: 0.1 10*3/uL (ref 0.1–1.0)
Monocytes Relative: 9 %
Neutro Abs: 0.6 10*3/uL — ABNORMAL LOW (ref 1.7–7.7)
Neutrophils Relative %: 43 %
Platelet Count: 8 10*3/uL — CL (ref 150–400)
RBC: 3.45 MIL/uL — ABNORMAL LOW (ref 4.22–5.81)
RDW: 21.2 % — ABNORMAL HIGH (ref 11.5–15.5)
Smear Review: DECREASED
WBC Count: 1.4 10*3/uL — ABNORMAL LOW (ref 4.0–10.5)
nRBC: 0 % (ref 0.0–0.2)

## 2023-12-06 LAB — FERRITIN: Ferritin: 203 ng/mL (ref 24–336)

## 2023-12-06 LAB — SAMPLE TO BLOOD BANK

## 2023-12-06 NOTE — Progress Notes (Signed)
 [facility] OFFICE PROGRESS NOTE   Diagnosis: Multiple myeloma  INTERVAL HISTORY:   Andre [name] returns prior to scheduled follow-up due to progressive pancytopenia.  He denies fever.  No bleeding.  No falls this week.  He denies shortness of breath.  He felt better after the blood transfusion 11/28/2023.  Objective:  Vital signs in last 24 hours:  Blood pressure (!) 101/58, pulse 78, temperature 98.2 F (36.8 C), temperature source Temporal, resp. rate 18, height 5\' 7"  (1.702 m), weight 170 lb (77.1 kg), SpO2 100%.    HEENT: No thrush or ulcers.  Petechiae posterior palate and buccal mucosa.   Resp: Decreased breath sounds at the right base, faint rhonchi.  No respiratory distress. Cardio: Regular rate and rhythm. GI: No hepatosplenomegaly. Vascular: Trace lower leg edema bilaterally. Skin: Ecchymoses lower arms.   Lab Results:  Lab Results  Component Value Date   WBC 1.4 (L) 12/06/2023   HGB 8.4 (L) 12/06/2023   HCT 27.5 (L) 12/06/2023   MCV 79.7 (L) 12/06/2023   PLT 8 (LL) 12/06/2023   NEUTROABS 0.6 (L) 12/06/2023    Imaging:  No results found.  Medications: I have reviewed the patient's current medications.  Assessment/Plan: Multiple myeloma- IgG lambda  bortezomib (subcutaneously), lenalidomide, and dexamethasone, with repeat bone marrow biopsy May of 2012 showing 10% plasmacytosis   (2) high-dose chemotherapy with BCNU and melphalan at [facility], followed by stem cell rescue July of 2012   (3) on zoledronic acid started December of 2012, initially monthly, currently given every 3 months, most recent dose  12/07/2015   (4) low-dose lenalidomide resumed April 2013, interrupted several times.  Resumed again on 02/19/2013, ata dose of 5 mg daily, 21 days on and 7 days off, later further reduced to 7 days on, 7 days off   (5) CNS symptoms and abnormal brain MRI extensively evaluated by neurology with no definitive diagnosis established   (6) rising M  spike noted June 2014 but did not meet criteria for carfilzomib study   (7) on lenalidomide 25 mg daily, 14 days on, 7 days off, starting 04/18/2013, interrupted December 2014 because of rash;             (a) resumed January 2015 at 10 mg/ day at 21 days on/ 7 days off             (b) starting 08/18/2014 decreased to 10 mg/ day 14 days on, 7 days off because of cytopenias             (c) as of February 2016 was on 5 mg daily 7 days on 7 days off             (d) lenalidomide discontinued December 2016 with evidence of disease progression   (8) transient global amnesia 05/29/2015, resolved without intervention   (9) starting PVD 10/25/2015 w ASA 325 thromboprophylaxis, valacyclovir prophylaxis, last dose 12/17/2015             (a) pomalidomide 4 mg/d days 1-14             (b) bortezomib sQ days 2,5,9,12 of each 21 day cycle             (c) dexamethasome 20 mg two days a week             (d) dexamethasone bortezomib and pomalidomide discontinued late December 2018 with poor tolerance   (10) metapneumovirus pneumonia April 2017             (  a) completing course of steroids and week of bactrim mid April 2017   (11) status post second autologous transplant at [facility] 02/04/2016(preparatory regimen melphalan 200 mg/m)             (a) received twelve-month vaccinations 03/14/2017 (DPT, Haemophilus, Pneumovax 13, polio)             (b) 14 months injections 05/04/2016 include DTaP, Hib conjugate, HepB energex B 20 mcg/ml, Prevnar 13             (c) 24 month vaccines due at [facility] June 2019   (12) maintenance therapy started November 2017, consisting of             (a) bortezomib 1.3 mg/M2 every 14 days, first dose 07/27/2016             (b) pomalidomide 1 mg days 1-21 Q28 days, started 07/19/2016             (c) zolendronate monthly started 07/27/2016 (previously Q 3 months) however patient unable to tolerate, and changed back to q3 months in April, 2018             (d) Bortezomib changed to monthly as  of June 2018 because of tolerance issues, however discontinued after 09/14/2017 dose because of a rise in his M spike             (e) pomalidomide held after 10/18/2017 in preparation for possible study at [facility] (venetoclax)--never resumed             (f) with numbers actually improved off treatment, resumed every 2-week bortezomib 12/25/2017             (g) changed to every 3-week bortezomib as of 03/17/2019             (h) briefly on weekly treatments times 22 October 2020 due to increase in M spike             (I) maintenance therapy discontinued with evidence of progression   (13) bortezomib/daratumumab/dexamethasone started 12/20/2020.             (a) Decadron dose dropped to 10 mg day of and day following treatment starting May 2022             (b) day 8 bortezomib omitted beginning with the June cycle per patient preference             (C) treatment changed to monthly daratumumab/Velcade/Decadron beginning 06/27/2021-patient decision (14) weekly Velcade/Cytoxan beginning 11/14/2021 (15) changed to Cytoxan/carfilzomib/Decadron 12/19/2021-treatment placed on hold 05/15/2022, carfilzomib 05/24/2022 and 05/31/2022 16.  Leukocyte a pheresis procedure at [facility] 06/15/2022 17.  Treatment resumed with Cytoxan/carfilzomib/Decadron 06/28/2022, last given 09/06/2022 18.  CAR-T 09/26/2022 with Carvykti 19.  CRS and ICANS following CAR-T therapy, status post a Decadron taper completed 10/08/2022 20.  Presentation to the emergency room 10/30/2022 with failure to thrive and a fall  21.  Pancytopenia secondary to multiple myeloma and CAR-T therapy-improved 22.  Bone marrow biopsy 11/13/2022-mildly hypocellular bone marrow with relative erythroid hyperplasia and decreased megakaryocytes; no plasma cells identified by differential count or CD138 immunohistochemical stain; flow cytometry negative for a clonal plasma cell population.   bone marrow biopsy 05/10/2023-mildly hypercellular marrow with mild decrease in  megakaryocytes, plasma cells not increased, absent iron stores, 46 XY, negative myeloma FISH panel, negative myeloid panel 23.  Cognitive impairment predating CAR-T therapy and worsened following CAR-T therapy 24.  Admission 05/05/2023 with Pseudomonas sepsis CTs chest, abdomen, and pelvis-right lung  base atelectasis, no infiltrate 25.  Admission 05/20/2023 with hypoxia, altered mental status, and fever Infectious disease evaluation on hospital admission consistent with a urinary tract infection, urine culture positive for Pseudomonas aeruginosa 26.  Hypogammaglobulinemia secondary to multiple myeloma and CAR-T therapy Monthly IVIG 27.  Admission 06/14/2023 with severe anemia, hypotension, and report of a positive blood culture from 06/13/2023?  With gram-positive cocci in clusters, recent fall with a plain x-ray 06/14/2023 confirming left sixth and seventh rib fractures 28.  Thrombocytopenia-possibly bone marrow failure from extensive course of treatment, possible treatment related MDS, immune thrombocytopenia, atypical infection 06/20/2023 CMV and parvovirus negative  weekly Nplate beginning 06/20/2023 Nplate dose escalated per protocol 07/06/2023 29.  Admission 08/04/2023 with a fever 30.  Admission 08/11/2023 with a fever  Disposition: Andre [name] appears stable.  He has progressive pancytopenia.  We reviewed potential etiologies with him and his wife at today's visit including treatment related, MDS, myeloma.  We discussed a bone marrow biopsy to gather more information with the understanding this would not necessarily alter the plan going forward.  They expressed their understanding of this and had already decided to not undergo further evaluation.  We discussed the CBC from today which shows persistent pancytopenia with severe thrombocytopenia.  We reviewed neutropenic and thrombocytopenic precautions.  They will contact the office with fever, chills, other signs of infection, bleeding.  Andre [name]  does not want to receive platelets today.  He also indicates he does not want prophylactic antibiotics.  We reviewed continuation of as needed transfusion support versus hospice.  They would like to discuss these options with family over the weekend and see how his labs look when he comes back for his scheduled appointment 12/12/2023.  Patient seen with Dr. Truett Perna.   Lonna Cobb ANP/GNP-BC   12/06/2023  9:01 AM This was a shared with Lonna Cobb.  Andre [name] was interviewed and examined.  He has progressive pancytopenia.  We discussed the differential diagnosis with Andre [name] and his wife.  He understands the risk of infection and bleeding.  He decided against platelet transfusion and a diagnostic bone marrow biopsy.  He will return for an office visit next week.  He and his family are considering options of hospice care versus continuing lab testing/transfusions.  I was present for greater than 50% of today's visit.  I performed medical decision making.  Mancel Bale, MD

## 2023-12-06 NOTE — Telephone Encounter (Signed)
 Left patient a vm regarding upcoming appointment changes

## 2023-12-06 NOTE — Progress Notes (Signed)
 CRITICAL VALUE STICKER  CRITICAL VALUE: Platelets 8,000  RECEIVER (on-site recipient of call):Mishell Donalson,RN  DATE & TIME NOTIFIED: 12/06/23 @ 0840  MESSENGER (representative from lab): Marchelle Folks  MD NOTIFIED: Lonna Cobb, NP  TIME OF NOTIFICATION: [phone]  RESPONSE:

## 2023-12-12 ENCOUNTER — Encounter: Payer: Self-pay | Admitting: Nurse Practitioner

## 2023-12-12 ENCOUNTER — Inpatient Hospital Stay

## 2023-12-12 ENCOUNTER — Ambulatory Visit: Admitting: Nurse Practitioner

## 2023-12-12 ENCOUNTER — Inpatient Hospital Stay (HOSPITAL_BASED_OUTPATIENT_CLINIC_OR_DEPARTMENT_OTHER): Admitting: Nurse Practitioner

## 2023-12-12 ENCOUNTER — Other Ambulatory Visit

## 2023-12-12 VITALS — BP 90/58 | HR 80 | Temp 98.2°F | Resp 18 | Ht 67.0 in | Wt 172.6 lb

## 2023-12-12 DIAGNOSIS — D696 Thrombocytopenia, unspecified: Secondary | ICD-10-CM

## 2023-12-12 DIAGNOSIS — C9000 Multiple myeloma not having achieved remission: Secondary | ICD-10-CM

## 2023-12-12 DIAGNOSIS — D61810 Antineoplastic chemotherapy induced pancytopenia: Secondary | ICD-10-CM | POA: Diagnosis not present

## 2023-12-12 DIAGNOSIS — D649 Anemia, unspecified: Secondary | ICD-10-CM | POA: Diagnosis not present

## 2023-12-12 DIAGNOSIS — D801 Nonfamilial hypogammaglobulinemia: Secondary | ICD-10-CM

## 2023-12-12 LAB — CBC WITH DIFFERENTIAL (CANCER CENTER ONLY)
Abs Immature Granulocytes: 0.08 10*3/uL — ABNORMAL HIGH (ref 0.00–0.07)
Basophils Absolute: 0 10*3/uL (ref 0.0–0.1)
Basophils Relative: 1 %
Eosinophils Absolute: 0.1 10*3/uL (ref 0.0–0.5)
Eosinophils Relative: 9 %
HCT: 25.2 % — ABNORMAL LOW (ref 39.0–52.0)
Hemoglobin: 7.4 g/dL — ABNORMAL LOW (ref 13.0–17.0)
Immature Granulocytes: 6 %
Lymphocytes Relative: 19 %
Lymphs Abs: 0.3 10*3/uL — ABNORMAL LOW (ref 0.7–4.0)
MCH: 23.8 pg — ABNORMAL LOW (ref 26.0–34.0)
MCHC: 29.4 g/dL — ABNORMAL LOW (ref 30.0–36.0)
MCV: 81 fL (ref 80.0–100.0)
Monocytes Absolute: 0.1 10*3/uL (ref 0.1–1.0)
Monocytes Relative: 9 %
Neutro Abs: 0.8 10*3/uL — ABNORMAL LOW (ref 1.7–7.7)
Neutrophils Relative %: 56 %
Platelet Count: 11 10*3/uL — ABNORMAL LOW (ref 150–400)
RBC: 3.11 MIL/uL — ABNORMAL LOW (ref 4.22–5.81)
RDW: 22 % — ABNORMAL HIGH (ref 11.5–15.5)
WBC Count: 1.4 10*3/uL — ABNORMAL LOW (ref 4.0–10.5)
nRBC: 2.9 % — ABNORMAL HIGH (ref 0.0–0.2)

## 2023-12-12 LAB — SAMPLE TO BLOOD BANK

## 2023-12-12 NOTE — Progress Notes (Unsigned)
 [facility] OFFICE PROGRESS NOTE   Diagnosis: Multiple myeloma  INTERVAL HISTORY:   Andre [name] returns as scheduled.  He has an occasional mild nosebleed, otherwise no bleeding.  No fever.  Objective:  Vital signs in last 24 hours:  Blood pressure (!) 90/58, pulse 80, temperature 98.2 F (36.8 C), temperature source Temporal, resp. rate 18, height 5\' 7"  (1.702 m), weight 172 lb 9.6 oz (78.3 kg), SpO2 96%.    HEENT: No thrush or ulcers.  No bleeding in the mouth. Resp: Lungs clear bilaterally. Cardio: Regular rate and rhythm. GI: No hepatosplenomegaly. Vascular: Trace edema lower leg bilaterally.   Lab Results:  Lab Results  Component Value Date   WBC 1.4 (L) 12/12/2023   HGB 7.4 (L) 12/12/2023   HCT 25.2 (L) 12/12/2023   MCV 81.0 12/12/2023   PLT 11 (L) 12/12/2023   NEUTROABS 0.8 (L) 12/12/2023    Imaging:  No results found.  Medications: I have reviewed the patient's current medications.  Assessment/Plan: Multiple myeloma- IgG lambda  bortezomib (subcutaneously), lenalidomide, and dexamethasone, with repeat bone marrow biopsy May of 2012 showing 10% plasmacytosis   (2) high-dose chemotherapy with BCNU and melphalan at [facility], followed by stem cell rescue July of 2012   (3) on zoledronic acid started December of 2012, initially monthly, currently given every 3 months, most recent dose  12/07/2015   (4) low-dose lenalidomide resumed April 2013, interrupted several times.  Resumed again on 02/19/2013, ata dose of 5 mg daily, 21 days on and 7 days off, later further reduced to 7 days on, 7 days off   (5) CNS symptoms and abnormal brain MRI extensively evaluated by neurology with no definitive diagnosis established   (6) rising M spike noted June 2014 but did not meet criteria for carfilzomib study   (7) on lenalidomide 25 mg daily, 14 days on, 7 days off, starting 04/18/2013, interrupted December 2014 because of rash;             (a) resumed  January 2015 at 10 mg/ day at 21 days on/ 7 days off             (b) starting 08/18/2014 decreased to 10 mg/ day 14 days on, 7 days off because of cytopenias             (c) as of February 2016 was on 5 mg daily 7 days on 7 days off             (d) lenalidomide discontinued December 2016 with evidence of disease progression   (8) transient global amnesia 05/29/2015, resolved without intervention   (9) starting PVD 10/25/2015 w ASA 325 thromboprophylaxis, valacyclovir prophylaxis, last dose 12/17/2015             (a) pomalidomide 4 mg/d days 1-14             (b) bortezomib sQ days 2,5,9,12 of each 21 day cycle             (c) dexamethasome 20 mg two days a week             (d) dexamethasone bortezomib and pomalidomide discontinued late December 2018 with poor tolerance   (10) metapneumovirus pneumonia April 2017             (a) completing course of steroids and week of bactrim mid April 2017   (11) status post second autologous transplant at [facility] 02/04/2016(preparatory regimen melphalan 200 mg/m)             (  a) received twelve-month vaccinations 03/14/2017 (DPT, Haemophilus, Pneumovax 13, polio)             (b) 14 months injections 05/04/2016 include DTaP, Hib conjugate, HepB energex B 20 mcg/ml, Prevnar 13             (c) 24 month vaccines due at [facility] June 2019   (12) maintenance therapy started November 2017, consisting of             (a) bortezomib 1.3 mg/M2 every 14 days, first dose 07/27/2016             (b) pomalidomide 1 mg days 1-21 Q28 days, started 07/19/2016             (c) zolendronate monthly started 07/27/2016 (previously Q 3 months) however patient unable to tolerate, and changed back to q3 months in April, 2018             (d) Bortezomib changed to monthly as of June 2018 because of tolerance issues, however discontinued after 09/14/2017 dose because of a rise in his M spike             (e) pomalidomide held after 10/18/2017 in preparation for possible study at [facility]  (venetoclax)--never resumed             (f) with numbers actually improved off treatment, resumed every 2-week bortezomib 12/25/2017             (g) changed to every 3-week bortezomib as of 03/17/2019             (h) briefly on weekly treatments times 22 October 2020 due to increase in M spike             (I) maintenance therapy discontinued with evidence of progression   (13) bortezomib/daratumumab/dexamethasone started 12/20/2020.             (a) Decadron dose dropped to 10 mg day of and day following treatment starting May 2022             (b) day 8 bortezomib omitted beginning with the June cycle per patient preference             (C) treatment changed to monthly daratumumab/Velcade/Decadron beginning 06/27/2021-patient decision (14) weekly Velcade/Cytoxan beginning 11/14/2021 (15) changed to Cytoxan/carfilzomib/Decadron 12/19/2021-treatment placed on hold 05/15/2022, carfilzomib 05/24/2022 and 05/31/2022 16.  Leukocyte a pheresis procedure at [facility] 06/15/2022 17.  Treatment resumed with Cytoxan/carfilzomib/Decadron 06/28/2022, last given 09/06/2022 18.  CAR-T 09/26/2022 with Carvykti 19.  CRS and ICANS following CAR-T therapy, status post a Decadron taper completed 10/08/2022 20.  Presentation to the emergency room 10/30/2022 with failure to thrive and a fall  21.  Pancytopenia secondary to multiple myeloma and CAR-T therapy-improved 22.  Bone marrow biopsy 11/13/2022-mildly hypocellular bone marrow with relative erythroid hyperplasia and decreased megakaryocytes; no plasma cells identified by differential count or CD138 immunohistochemical stain; flow cytometry negative for a clonal plasma cell population.   bone marrow biopsy 05/10/2023-mildly hypercellular marrow with mild decrease in megakaryocytes, plasma cells not increased, absent iron stores, 46 XY, negative myeloma FISH panel, negative myeloid panel 23.  Cognitive impairment predating CAR-T therapy and worsened following CAR-T  therapy 24.  Admission 05/05/2023 with Pseudomonas sepsis CTs chest, abdomen, and pelvis-right lung base atelectasis, no infiltrate 25.  Admission 05/20/2023 with hypoxia, altered mental status, and fever Infectious disease evaluation on hospital admission consistent with a urinary tract infection, urine culture positive for Pseudomonas aeruginosa 26.  Hypogammaglobulinemia secondary to multiple  myeloma and CAR-T therapy Monthly IVIG 27.  Admission 06/14/2023 with severe anemia, hypotension, and report of a positive blood culture from 06/13/2023?  With gram-positive cocci in clusters, recent fall with a plain x-ray 06/14/2023 confirming left sixth and seventh rib fractures 28.  Thrombocytopenia-possibly bone marrow failure from extensive course of treatment, possible treatment related MDS, immune thrombocytopenia, atypical infection 06/20/2023 CMV and parvovirus negative  weekly Nplate beginning 06/20/2023 Nplate dose escalated per protocol 07/06/2023 29.  Admission 08/04/2023 with a fever 30.  Admission 08/11/2023 with a fever  Disposition: Andre [name] appears unchanged.  He has persistent severe pancytopenia.  He and his wife understand the etiology of the pancytopenia is unclear and could be related to progressive myeloma, previous treatment, MDS, other.  They are not interested in pursuing a bone marrow biopsy.  We discussed a hospice referral versus periodic labs and transfusion support.  Andre [name] feels his quality of life is fairly good and would like to continue to check labs and receive red cell transfusions for symptomatic anemia and platelet transfusions if he has bleeding.  He and his wife understand the risk of infection and bleeding based on his current blood counts.  He will have a CBC in 2 weeks at [facility].  He will return for a CBC and follow-up visit in our office in 4 weeks.  We are available to see him sooner if needed.  Patient seen with Dr. Truett Perna.   Lonna Cobb  ANP/GNP-BC   12/12/2023  12:47 PM This was a shared visit with Lonna Cobb.  Andre [name] is persistent severe pancytopenia.  We discussed the differential diagnosis again today.  He does not want to proceed with a diagnostic bone marrow biopsy.  He would like to continue CBC follow-up with transfusion support for symptomatic anemia or bleeding.  He declines hospice care at present.  I was present for greater than 50% of today's visit.  I performed medical decision making.  Mancel Bale, MD

## 2023-12-13 ENCOUNTER — Telehealth: Payer: Self-pay

## 2023-12-13 ENCOUNTER — Inpatient Hospital Stay

## 2023-12-13 ENCOUNTER — Encounter: Payer: Medicare Other | Admitting: Psychology

## 2023-12-13 ENCOUNTER — Encounter: Payer: Self-pay | Admitting: Oncology

## 2023-12-13 ENCOUNTER — Non-Acute Institutional Stay (SKILLED_NURSING_FACILITY): Payer: Self-pay | Admitting: Adult Health

## 2023-12-13 ENCOUNTER — Encounter: Payer: Self-pay | Admitting: Adult Health

## 2023-12-13 DIAGNOSIS — K922 Gastrointestinal hemorrhage, unspecified: Secondary | ICD-10-CM | POA: Diagnosis not present

## 2023-12-13 DIAGNOSIS — I951 Orthostatic hypotension: Secondary | ICD-10-CM

## 2023-12-13 DIAGNOSIS — D61818 Other pancytopenia: Secondary | ICD-10-CM

## 2023-12-13 DIAGNOSIS — C9000 Multiple myeloma not having achieved remission: Secondary | ICD-10-CM

## 2023-12-13 DIAGNOSIS — D61810 Antineoplastic chemotherapy induced pancytopenia: Secondary | ICD-10-CM | POA: Diagnosis not present

## 2023-12-13 DIAGNOSIS — R195 Other fecal abnormalities: Secondary | ICD-10-CM | POA: Diagnosis not present

## 2023-12-13 LAB — PREPARE RBC (CROSSMATCH)

## 2023-12-13 MED ORDER — PANTOPRAZOLE SODIUM 40 MG PO TBEC: 40.0000 mg | DELAYED_RELEASE_TABLET | Freq: Two times a day (BID) | ORAL | Status: DC

## 2023-12-13 MED ORDER — HYDROCORTISONE ACETATE 25 MG RE SUPP: 25.0000 mg | Freq: Every day | RECTAL | Status: AC

## 2023-12-13 NOTE — Progress Notes (Signed)
 Location:  Medical illustrator of Service:  SNF (31) Provider:   Peggye Ley, ANP [facility] [phone]   [name], MD  Patient Care Team: [name], MD as PCP - General (Internal Medicine) Shirlean Schlein, MD as Consulting Physician (Neurology) Eddie Candle, MD as Consulting Physician (Internal Medicine) York Spaniel, MD (Inactive) as Consulting Physician (Neurology) Sharlett Iles, DDS as Referring Physician (Oral Surgery) Ladene Artist, MD as Consulting Physician (Oncology) Fletcher Anon, NP as Nurse Practitioner (Nurse Practitioner)  Extended Emergency Contact Information Primary Emergency Contact: Fryman,[name] Address: [address]          [place], [place] [address] [name] of [place] Home Phone: [phone] Relation: Spouse  Code Status:  DNR no hospitalizations Goals of care: Advanced Directive information    10/25/2023    1:26 PM  Advanced Directives  Does Patient Have a Medical Advance Directive? Yes  Type of Advance Directive Out of facility DNR (pink MOST or yellow form);Living will;Healthcare Power of Attorney  Does patient want to make changes to medical advance directive? No - Patient declined  Would patient like information on creating a medical advance directive? No - Patient declined     Chief Complaint  Patient presents with   Acute Visit    Gi bleed    HPI:  The patient is a 73 year old male with multiple myeloma who presents with GI bleeding.   He resides in skilled care at [facility], where a nurse reported blood around his stool this morning. He also experienced weakness and appeared pale. He has a history of internal hemorrhoids and duodenal ulcers noted on EGD and Colonscopy in the past. He is on Protonix once a day and iron twice a day. He has a history of heme positive stools for blood, which was not recently worked up due to his underlying health  condition.  He has a history of multiple myeloma and was seen by the oncology nurse practitioner one day ago. He has persistent severe pancytopenia, which could be due to progressive myeloma, prior treatment, or MDS. Currently, he is receiving lab work and transfusion support. His recent lab results show worsening hemoglobin, platelets, and white count. On November 26, 2023, his white count was 2.1, hemoglobin 6.2, MCV 77.8, and platelets 15. He received a blood transfusion on March 12. On December 06, 2023, his white count was 1.4, hemoglobin 8.4, MCV 79.7, and platelets 8. On March 26, his hemoglobin was 7.4, white count remained at 1.4, and platelets were 11.  In his past medical history, he had a colonoscopy in 2015 which showed small internal bleeding hemorrhoids and an EGD which revealed duodenal ulcers.  His wife, Shaune Pascal, indicates that while his labs are concerning and he is declining, he understands that low numbers can persist for some time according to oncology. He does not want to pursue hospice and wishes to continue transfusions. He has a DNR and does not want to return to the hospital. Past Medical History:  Diagnosis Date   Anemia    Arthritis    Asthma    no treatment x 20 years   Depression    Double vision    occurs at times    Duodenal ulcer    GERD (gastroesophageal reflux disease)    Hemorrhage of rectum and anus 09/15/2013   Hyperlipidemia    Hypertension    Hypothyroidism    Leukopenia due to antineoplastic chemotherapy (HCC) 02/22/2017  Multiple myeloma 07/04/2011   Thyroid disease    TIA (transient ischemic attack) 03/27/2012   Transient diplopia 03/27/2012   Transient global amnesia 06/07/2015   Past Surgical History:  Procedure Laterality Date   BONE MARROW TRANSPLANT  2011   for MM   CARDIOLITE STUDY  11/25/2003   NORMAL   TONSILLECTOMY      Allergies  Allergen Reactions   Atorvastatin Other (See Comments)    (LIPITOR) "Allergic," per [facility]    Rosuvastatin Other (See Comments)    (CRESTOR) Liver Disorder (adverse effects) and "Allergic," per [facility]   Beef-Derived Drug Products Other (See Comments)    pescatarian    Chicken Protein Other (See Comments)    pescatarian   Pork-Derived Products Other (See Comments)    pescatarian    Septra [Sulfamethoxazole-Trimethoprim] Rash and Other (See Comments)    "Allergic," per [facility]    Outpatient Encounter Medications as of 12/13/2023  Medication Sig   hydrocortisone (ANUSOL-HC) 25 MG suppository Place 1 suppository (25 mg total) rectally daily for 5 days.   acetaminophen (TYLENOL) 325 MG tablet Take 650 mg by mouth 3 (three) times daily.   Calcium Carb-Cholecalciferol (CALCIUM + VITAMIN D3) 500-5 MG-MCG TABS Take 1 tablet by mouth in the morning.   calcium carbonate (TUMS EX) 750 MG chewable tablet Chew 2 tablets by mouth 3 (three) times daily as needed (for indigestion).   Cholecalciferol (VITAMIN D3) 25 MCG (1000 UT) capsule Take 1,000 Units by mouth daily.   cyanocobalamin (VITAMIN B12) 1000 MCG tablet Take 1 tablet (1,000 mcg total) by mouth daily. (Patient taking differently: Take 1,000 mcg by mouth in the morning.)   dapsone 100 MG tablet Take 100 mg by mouth daily.   docusate sodium (COLACE) 100 MG capsule Take 100 mg by mouth daily as needed (for constipation).   escitalopram (LEXAPRO) 10 MG tablet Take 1 tablet (10 mg total) by mouth daily.   ferrous sulfate 325 (65 FE) MG EC tablet Take 325 mg by mouth 2 (two) times daily.   folic acid (FOLVITE) 1 MG tablet Take 1 tablet (1 mg total) by mouth daily. (Patient taking differently: Take 1 mg by mouth in the morning.)   levothyroxine (SYNTHROID) 125 MCG tablet Take 125 mcg by mouth daily before breakfast.   Menthol-Methyl Salicylate (MUSCLE RUB) 10-15 % CREA Apply 1 Application topically as needed for muscle pain.   methylphenidate (RITALIN) 5 MG tablet Take 0.5 tablets (2.5 mg total) by mouth in the morning.   midodrine (PROAMATINE) 2.5  MG tablet Take 1 tablet (2.5 mg total) by mouth 2 (two) times daily with a meal. (Patient taking differently: Take 2.5 mg by mouth 2 (two) times daily with a meal. Hold is SBP >150)   ondansetron (ZOFRAN-ODT) 4 MG disintegrating tablet Take 4 mg by mouth every 6 (six) hours as needed for nausea or vomiting.   pantoprazole (PROTONIX) 40 MG tablet Take 1 tablet (40 mg total) by mouth 2 (two) times daily before a meal.   Probiotic Product (UP4 PROBIOTICS WOMENS PO) Take 1 capsule by mouth daily. Hyperbiotics pro women wit D manos plus cranberry   tamsulosin (FLOMAX) 0.4 MG CAPS capsule Take 0.4 mg by mouth at bedtime.   traMADol (ULTRAM) 50 MG tablet Take 25-50 mg by mouth every 12 (twelve) hours as needed for moderate pain (pain score 4-6) or severe pain (pain score 7-10).   valACYclovir (VALTREX) 500 MG tablet Take 500 mg by mouth in the morning.   [DISCONTINUED] pantoprazole (PROTONIX)  40 MG tablet Take 1 tablet (40 mg total) by mouth daily. (Patient taking differently: Take 40 mg by mouth daily with breakfast.)   [DISCONTINUED] prochlorperazine (COMPAZINE) 10 MG tablet Take 1 tablet (10 mg total) by mouth every 6 (six) hours as needed (Nausea or vomiting).   No facility-administered encounter medications on file as of 12/13/2023.    Review of Systems  Constitutional:  Positive for activity change and fatigue. Negative for appetite change, chills, diaphoresis, fever and unexpected weight change.  Respiratory:  Positive for shortness of breath (on exertion). Negative for cough, wheezing and stridor.   Cardiovascular:  Positive for leg swelling. Negative for chest pain and palpitations.  Gastrointestinal:  Positive for blood in stool. Negative for abdominal distention, abdominal pain, constipation and diarrhea.  Genitourinary:  Negative for difficulty urinating and dysuria.  Musculoskeletal:  Positive for gait problem. Negative for arthralgias, back pain, joint swelling and myalgias.  Neurological:   Positive for weakness. Negative for dizziness, seizures, syncope, facial asymmetry, speech difficulty and headaches.  Hematological:  Negative for adenopathy. Bruises/bleeds easily.  Psychiatric/Behavioral:  Positive for confusion. Negative for agitation and behavioral problems.     Immunization History  Administered Date(s) Administered   DTaP / Hep B / IPV 05/04/2017   DTaP / HiB / IPV 03/06/2018   Fluad Quad(high Dose 65+) 06/09/2019, 06/25/2020, 07/25/2021, 05/30/2022   Fluad Trivalent(High Dose 65+) 08/04/2012, 07/03/2014, 06/16/2023   HIB (PRP-T) 05/04/2017   Hepatitis B, ADULT 03/14/2017, 03/06/2018   IPV 01/28/1998   Influenza, High Dose Seasonal PF 06/16/2018   Influenza,inj,Quad PF,6+ Mos 06/16/2013, 07/03/2014, 06/11/2015, 05/31/2017   Influenza-Unspecified 07/29/2012   Moderna Covid-19 Vaccine Bivalent Booster 75yrs & up 07/10/2023   PFIZER(Purple Top)SARS-COV-2 Vaccination 10/26/2019, 11/20/2019, 07/22/2020   Pfizer Covid-19 Vaccine Bivalent Booster 25yrs & up 06/27/2021   Pfizer(Comirnaty)Fall Seasonal Vaccine 12 years and older 07/05/2022   Pneumococcal Conjugate-13 10/15/2013, 05/04/2017, 03/06/2018   Pneumococcal Polysaccharide-23 06/16/2013   Respiratory Syncytial Virus Vaccine,Recomb Aduvanted(Arexvy) 05/30/2022   Td 02/10/1998, 07/03/2014   Pertinent  Health Maintenance Due  Topic Date Due   Colonoscopy  09/24/2023   INFLUENZA VACCINE  Completed      08/30/2022   11:01 AM 09/06/2022   10:00 AM 02/15/2023    1:29 PM 06/11/2023   11:20 AM 06/27/2023   10:37 AM  Fall Risk  Falls in the past year?   1 0 1  Was there an injury with Fall?   1 0 1  Fall Risk Category Calculator   3 0 3  (RETIRED) Patient Fall Risk Level Low fall risk Low fall risk     Patient at Risk for Falls Due to    History of fall(s) History of fall(s);Impaired balance/gait;Impaired mobility  Fall risk Follow up   Falls evaluation completed Falls evaluation completed Falls evaluation  completed;Education provided;Falls prevention discussed   Functional Status Survey:    Vitals:   12/13/23 1222  BP: 92/60  Pulse: (!) 115  Resp: 20  Temp: (!) 97.3 F (36.3 C)  SpO2: 96%   There is no height or weight on file to calculate BMI. Physical Exam Vitals and nursing note reviewed.  Constitutional:      Appearance: He is ill-appearing.  HENT:     Head: Normocephalic and atraumatic.  Cardiovascular:     Rate and Rhythm: Normal rate and regular rhythm.     Heart sounds: No murmur heard. Pulmonary:     Effort: Pulmonary effort is normal. No respiratory distress.  Breath sounds: Rales (bases) present. No wheezing.  Abdominal:     General: Bowel sounds are normal. There is no distension.     Palpations: Abdomen is soft.     Tenderness: There is no abdominal tenderness.  Musculoskeletal:     Cervical back: No rigidity.     Right lower leg: No edema.     Left lower leg: No edema.  Lymphadenopathy:     Cervical: No cervical adenopathy.  Skin:    General: Skin is warm and dry.     Coloration: Skin is pale.     Findings: Bruising (to right flank, arms and legs) present.  Neurological:     General: No focal deficit present.     Mental Status: He is alert. Mental status is at baseline.  Psychiatric:        Mood and Affect: Mood normal.     Labs reviewed: Recent Labs    05/23/23 0413 06/06/23 0849 06/16/23 0536 06/17/23 0517 06/27/23 0933 08/02/23 0825 08/16/23 0402 08/17/23 0333 08/30/23 0750 09/27/23 0827  NA 134*   < > 137   < > 139   < > 132* 137 140 138  K 3.7   < > 3.9   < > 3.7   < > 3.3* 3.6 3.9 4.4  CL 101   < > 104   < > 105   < > 98 104 105 104  CO2 25   < > 25   < > 25   < > 27 26 27 25   GLUCOSE 97   < > 92   < > 114*   < > 94 103* 102* 117*  BUN 21   < > 17   < > 16   < > 15 14 11 14   CREATININE 0.91   < > 0.92   < > 0.93   < > 0.82 0.81 0.86 0.89  CALCIUM 8.9   < > 8.8*   < > 9.9   < > 8.8* 8.7* 9.1 9.0  MG 2.1  --  2.3   < > 1.9   <  > 2.2 2.2  --  2.2  PHOS 2.4*  --  3.7  --  2.8  --   --   --   --   --    < > = values in this interval not displayed.   Recent Labs    08/11/23 1118 08/30/23 0750 09/27/23 0827  AST 19 14* 14*  ALT 10 9 13   ALKPHOS 64 71 91  BILITOT 0.5 0.5 0.6  PROT 6.2* 5.9* 6.2*  ALBUMIN 3.3* 3.7 4.2   Recent Labs    11/26/23 1303 12/06/23 0802 12/12/23 1101  WBC 2.1* 1.4* 1.4*  NEUTROABS 1.3* 0.6* 0.8*  HGB 6.2* 8.4* 7.4*  HCT 20.7* 27.5* 25.2*  MCV 77.8* 79.7* 81.0  PLT 15* 8* 11*   Lab Results  Component Value Date   TSH 1.47 10/04/2023   Lab Results  Component Value Date   HGBA1C 4.3 (L) 05/06/2023   Lab Results  Component Value Date   CHOL 178 02/13/2023   HDL 42 02/13/2023   LDLCALC 113 02/13/2023   LDLDIRECT 155.8 11/13/2012   TRIG 167 (A) 02/13/2023   CHOLHDL 4 09/06/2017    Significant Diagnostic Results in last 30 days:  No results found.  Assessment/Plan  Gastrointestinal bleeding  Gastrointestinal bleeding with hematochezia, weakness, pallor, and tachycardia. Potential sources include internal hemorrhoids and duodenal ulcers. Severe pancytopenia, particularly thrombocytopenia,  increases bleeding risk. We are not able to identify the source in this setting.  - Increase Protonix to twice daily. - Prescribe hemorrhoid suppository for 5 days. - Arrange follow-up labs with the cancer center.  Multiple myeloma with pancytopenia  Worsening counts possibly due to progressive myeloma, prior treatment, or MDS. Declined further invasive diagnostics. Receiving transfusion support and not pursuing hospice care. - Coordinate with oncology for platelet and blood transfusion. - Arrange follow-up labs with the cancer center.  Orthostatic hypotension Managed with midodrine. Blood pressure is close to baseline. If worsening increase dosage.   Goals of Care Has a DNR order and prefers not to return to the hospital. Declined hospice care in favor of transfusion support.  Family understands risks and prefers continued transfusions.    Family/ staff Communication: discussed with his wife Shaune Pascal and Dr Chales Abrahams.

## 2023-12-13 NOTE — Telephone Encounter (Signed)
 [name] spouse and the facility have been informed of the appointment time and the requirement to wear the blue wristband. I contacted Tresa Endo at [facility] lab to confirm the blood order and type screening. Sharilyn Sites has been scheduled for pick-up tomorrow morning at 0700.

## 2023-12-13 NOTE — Addendum Note (Signed)
 Addended by: Dimitri Ped on: 12/13/2023 02:50 PM   Modules accepted: Orders

## 2023-12-14 ENCOUNTER — Inpatient Hospital Stay

## 2023-12-14 ENCOUNTER — Other Ambulatory Visit: Payer: Self-pay | Admitting: Adult Health

## 2023-12-14 VITALS — BP 116/74 | HR 76 | Temp 98.0°F | Resp 17

## 2023-12-14 DIAGNOSIS — D61810 Antineoplastic chemotherapy induced pancytopenia: Secondary | ICD-10-CM | POA: Diagnosis not present

## 2023-12-14 DIAGNOSIS — D649 Anemia, unspecified: Secondary | ICD-10-CM

## 2023-12-14 DIAGNOSIS — D696 Thrombocytopenia, unspecified: Secondary | ICD-10-CM

## 2023-12-14 MED ORDER — SODIUM CHLORIDE 0.9% IV SOLUTION
250.0000 mL | INTRAVENOUS | Status: DC
  Administered 2023-12-14: 250 mL via INTRAVENOUS

## 2023-12-14 MED ORDER — SODIUM CHLORIDE 0.9% IV SOLUTION
250.0000 mL | INTRAVENOUS | Status: DC
Start: 2023-12-14 — End: 2023-12-14
  Administered 2023-12-14: 250 mL via INTRAVENOUS

## 2023-12-14 MED ORDER — METHYLPHENIDATE HCL 5 MG PO TABS: 2.5000 mg | ORAL_TABLET | Freq: Every morning | ORAL | 0 refills | Status: DC

## 2023-12-14 MED ORDER — SODIUM CHLORIDE 0.9% FLUSH: 10.0000 mL | INTRAVENOUS | Status: DC | PRN

## 2023-12-14 NOTE — Patient Instructions (Addendum)
 Blood Transfusion, Adult, Care After After a blood transfusion, it is common to have: Bruising and soreness at the IV site. A headache. Follow these instructions at home: Your doctor may give you more instructions. If you have problems, contact your doctor. Insertion site care     Follow instructions from your doctor about how to take care of your insertion site. This is where an IV tube was put into your vein. Make sure you: Wash your hands with soap and water for at least 20 seconds before and after you change your bandage. If you cannot use soap and water, use hand sanitizer. Change your bandage as told by your doctor. Check your insertion site every day for signs of infection. Check for: Redness, swelling, or pain. Bleeding from the site. Warmth. Pus or a bad smell. General instructions Take over-the-counter and prescription medicines only as told by your doctor. Rest as told by your doctor. Go back to your normal activities as told by your doctor. Keep all follow-up visits. You may need to have tests at certain times to check your blood. Contact a doctor if: You have itching or red, swollen areas of skin (hives). You have a fever or chills. You have pain in the head, back, or chest. You feel worried or nervous (anxious). You feel weak after doing your normal activities. You have any of these problems at the insertion site: Redness, swelling, warmth, or pain. Bleeding that does not stop with pressure. Pus or a bad smell. If you received your blood transfusion in an outpatient setting, you will be told whom to contact to report any reactions. Get help right away if: You have signs of a serious reaction. This may be coming from an allergy or the body's defense system (immune system). Signs include: Trouble breathing or shortness of breath. Swelling of the face or feeling warm (flushed). A widespread rash. Dark pee (urine) or blood in the pee. Fast heartbeat. These symptoms  may be an emergency. Get help right away. Call 911. Do not wait to see if the symptoms will go away. Do not drive yourself to the hospital. Summary Bruising and soreness at the IV site are common. Check your insertion site every day for signs of infection. Rest as told by your doctor. Go back to your normal activities as told by your doctor. Get help right away if you have signs of a serious reaction. This information is not intended to replace advice given to you by your health care provider. Make sure you discuss any questions you have with your health care provider. Document Revised: 12/02/2021 Document Reviewed: 12/02/2021 Elsevier Patient Education  2024 Elsevier Inc[employer]   Platelet Transfusion A platelet transfusion is a procedure in which a person receives donated platelets through an IV. Platelets are parts of blood that stick together and form a clot to help the body stop bleeding after an injury. If you have too few platelets, your blood may have trouble clotting. This may cause you to bleed and bruise very easily. You may need a platelet transfusion if you have a condition that causes a low number of platelets (thrombocytopenia). A platelet transfusion may be used to stop or prevent excessive bleeding. Tell a health care provider about: Any reactions you have had during previous transfusions. Any allergies you have. All medicines you are taking, including vitamins, herbs, eye drops, creams, and over-the-counter medicines. Any bleeding problems you have. Any surgeries you have had. Any medical conditions you have. Whether you are pregnant  or may be pregnant. What are the risks? Generally, this is a safe procedure. However, problems may occur, including: Fever. Infection. Allergic reaction to the donated (donor) platelets. Your body's disease-fighting system (immune system) attacking the donor platelets (hemolytic reaction). This is rare. A rare reaction that causes lung damage  (transfusion-related acute lung injury). What happens before the procedure? Medicines Ask your health care provider about: Changing or stopping your regular medicines. This is especially important if you are taking diabetes medicines or blood thinners. Taking medicines such as aspirin and ibuprofen. These medicines can thin your blood. Do not take these medicines unless your health care provider tells you to take them. Taking over-the-counter medicines, vitamins, herbs, and supplements. General instructions You will have a blood test to determine your blood type. Your blood type determines what kind of platelets you will be given. Follow instructions from your health care provider about eating or drinking restrictions. If you have had an allergic reaction to a transfusion in the past, you may be given medicine to help prevent a reaction. Your temperature, blood pressure, pulse, and breathing will be monitored. What happens during the procedure?  An IV will be inserted into one of your veins. For your safety, two health care providers will verify your identity along with the donor platelets about to be infused. A bag of donor platelets will be connected to your IV. The platelets will flow into your bloodstream. This usually takes 30-60 minutes. Your temperature, blood pressure, pulse, and breathing will be monitored during the transfusion. This helps detect early signs of any reaction. You will also be monitored for other symptoms that may indicate a reaction, including chills, hives, or itching. If you have signs of a reaction at any time, your transfusion will be stopped, and you may be given medicine to help manage the reaction. When your transfusion is complete, your IV will be removed. Pressure may be applied to the IV site for a few minutes to stop any bleeding. The IV site will be covered with a bandage (dressing). The procedure may vary among health care providers and hospitals. What  can I expect after the procedure? Your blood pressure, temperature, pulse, and breathing will be monitored until you leave the hospital or clinic. You may have some bruising and soreness at your IV site. Follow these instructions at home: Medicines Take over-the-counter and prescription medicines only as told by your health care provider. Talk with your health care provider before you take any medicines that contain aspirin or NSAIDs, such as ibuprofen. These medicines increase your risk for dangerous bleeding. IV site care Check your IV site every day for signs of infection. Check for: Redness, swelling, or pain. Fluid or blood. If fluid or blood drains from your IV site, use your hands to press down firmly on a bandage covering the area for a minute or two. Doing this should stop the bleeding. Warmth. Pus or a bad smell. General instructions Change or remove your dressing as told by your health care provider. Return to your normal activities as told by your health care provider. Ask your health care provider what activities are safe for you. Do not take baths, swim, or use a hot tub until your health care provider approves. Ask your health care provider if you may take showers. Keep all follow-up visits. This is important. Contact a health care provider if: You have a headache that does not go away with medicine. You have hives, rash, or itchy skin. You have  nausea or vomiting. You feel unusually tired or weak. You have signs of infection at your IV site. Get help right away if: You have a fever or chills. You urinate less often than usual. Your urine is darker colored than normal. You have any of the following: Trouble breathing. Pain in your back, abdomen, or chest. Cool, clammy skin. A fast heartbeat. Summary Platelets are tiny pieces of blood cells that clump together to form a blood clot when you have an injury. If you have too few platelets, your blood may have trouble  clotting. A platelet transfusion is a procedure in which you receive donated platelets through an IV. A platelet transfusion may be used to stop or prevent excessive bleeding. After the procedure, check your IV site every day for signs of infection. This information is not intended to replace advice given to you by your health care provider. Make sure you discuss any questions you have with your health care provider. Document Revised: 04/05/2023 Document Reviewed: 03/10/2021 Elsevier Patient Education  2024 [employer].

## 2023-12-17 LAB — BPAM RBC
Blood Product Expiration Date: 202504092359
Blood Product Expiration Date: 202504092359
ISSUE DATE / TIME: 202503280710
ISSUE DATE / TIME: 202503280710
Unit Type and Rh: 6200
Unit Type and Rh: 6200

## 2023-12-17 LAB — TYPE AND SCREEN
ABO/RH(D): A POS
Antibody Screen: NEGATIVE
Unit division: 0
Unit division: 0

## 2023-12-17 LAB — PREPARE PLATELET PHERESIS: Unit division: 0

## 2023-12-17 LAB — BPAM PLATELET PHERESIS
Blood Product Expiration Date: 202503302359
ISSUE DATE / TIME: 202503280916
Unit Type and Rh: 6200

## 2023-12-21 ENCOUNTER — Encounter: Payer: Self-pay | Admitting: Internal Medicine

## 2023-12-26 ENCOUNTER — Telehealth: Payer: Self-pay

## 2023-12-26 ENCOUNTER — Non-Acute Institutional Stay (SKILLED_NURSING_FACILITY): Payer: Self-pay | Admitting: Orthopedic Surgery

## 2023-12-26 ENCOUNTER — Encounter: Payer: Self-pay | Admitting: Orthopedic Surgery

## 2023-12-26 DIAGNOSIS — D61818 Other pancytopenia: Secondary | ICD-10-CM | POA: Diagnosis not present

## 2023-12-26 DIAGNOSIS — Z7189 Other specified counseling: Secondary | ICD-10-CM

## 2023-12-26 DIAGNOSIS — R5383 Other fatigue: Secondary | ICD-10-CM

## 2023-12-26 DIAGNOSIS — R509 Fever, unspecified: Secondary | ICD-10-CM

## 2023-12-26 MED ORDER — LORAZEPAM 0.5 MG PO TABS: 0.5000 mg | ORAL_TABLET | Freq: Four times a day (QID) | ORAL | 1 refills | Status: DC | PRN

## 2023-12-26 MED ORDER — MORPHINE SULFATE (CONCENTRATE) 20 MG/ML PO SOLN: 5.0000 mg | ORAL | 0 refills | Status: DC | PRN

## 2023-12-26 NOTE — Telephone Encounter (Signed)
 Copied from CRM [id number]. Topic: Clinical - Medication Question >> Dec 26, 2023 10:50 AM Maree Krabbe H wrote: Reason for CRM: Patient fever is a 102 and he's unresponsive and patients wife wants to know about hospice, patients wife [name] callback number is [phone].  This is a SNF resident and should be handled through the facility staff.   I called patients wife and she stated patient has a pulse and is breathing, however he is not making any verbal responses, she would like for him to be on hospice, and it would be best to speak directly with the provider about this.   I called Hazle Nordmann, NP and she is aware of this situation and states " Someone called me about this patient and I advised them to take patient to the ER, however I'm on my way there now and he is on my list to evaluate."   I then called patient back and left a detailed message informing patients spouse that instructions were given to take patient to the ER and Amy will see patient today if he is not already gone to the ER by the time she gets to [facility]. I also suggested that further communication be handled through the facility staff

## 2023-12-26 NOTE — Progress Notes (Signed)
 Location:  [facility] Room Number: 140/A Place of Service:  SNF [phone]) Provider:  Octavia Heir, NP   [name], MD  Patient Care Team: [name], MD as PCP - General (Internal Medicine) Shirlean Schlein, MD as Consulting Physician (Neurology) Eddie Candle, MD as Consulting Physician (Internal Medicine) York Spaniel, MD (Inactive) as Consulting Physician (Neurology) Sharlett Iles, DDS as Referring Physician (Oral Surgery) Ladene Artist, MD as Consulting Physician (Oncology) Fletcher Anon, NP as Nurse Practitioner (Nurse Practitioner)  Extended Emergency Contact Information Primary Emergency Contact: Boesen,[name] Address: [address]          [place], [place] [address] [name] Home Phone: [phone] Relation: Spouse  Code Status:  DNR Goals of care: Advanced Directive information    12/14/2023    1:27 PM  Advanced Directives  Does Patient Have a Medical Advance Directive? Yes  Does patient want to make changes to medical advance directive? No - Patient declined     Chief Complaint  Patient presents with   Acute Visit    Fever, lethargy    HPI:  Pt is a 73 y.o. male seen today for acute visit due to fever and lethargy.   He currently resides on the skilled nursing unit at [facility]. PMH: TIA, asthma, hypoxia OSA, GERD, peptic ulcer disease, recent GI bleeding,  hypothyroidism, chemotherapy related neuropathy, multiple myeloma, memory impairment, pancytopenia, weakness and constipation.   Last night his temperature was 101.9. He had symptoms of increased weakness, fever, nausea with emesis prior to fever. He refused dinner.   Fever reduced to 97.3 after receiving tylenol 650 mg po once. This morning nursing found him lethargic, fever returned and was 101. He was unable to take po medications. He was given tylenol suppository and phenergan suppository. No reduction in fever after  tylenol suppository given. On call provider recommended ED evaluation but family declined. He was recently seen by oncology due to pancytopenia possibly related to progressive myeloma. Patient refused bone marrow treatment at that time. Hospice was also discussed but declined. Patient agreed to continue with scheduled labs and red cell transfusions for symptomatic anemia and platelet transfusions if needed. At this time he does not respond to painful stimuli. He feels warm. HR elevated on exam. Treatment options discussed with wife, she would like to proceed with hospice.     Past Medical History:  Diagnosis Date   Anemia    Arthritis    Asthma    no treatment x 20 years   Depression    Double vision    occurs at times    Duodenal ulcer    GERD (gastroesophageal reflux disease)    Hemorrhage of rectum and anus 09/15/2013   Hyperlipidemia    Hypertension    Hypothyroidism    Leukopenia due to antineoplastic chemotherapy (HCC) 02/22/2017   Multiple myeloma 07/04/2011   Thyroid disease    TIA (transient ischemic attack) 03/27/2012   Transient diplopia 03/27/2012   Transient global amnesia 06/07/2015   Past Surgical History:  Procedure Laterality Date   BONE MARROW TRANSPLANT  2011   for MM   CARDIOLITE STUDY  11/25/2003   NORMAL   TONSILLECTOMY      Allergies  Allergen Reactions   Atorvastatin Other (See Comments)    (LIPITOR) "Allergic," per [facility]   Rosuvastatin Other (See Comments)    (CRESTOR) Liver Disorder (adverse effects) and "Allergic," per [facility]   Beef-Derived Drug Products Other (See  Comments)    pescatarian    Chicken Protein Other (See Comments)    pescatarian   Pork-Derived Products Other (See Comments)    pescatarian    Septra [Sulfamethoxazole-Trimethoprim] Rash and Other (See Comments)    "Allergic," per [facility]    Outpatient Encounter Medications as of 12/26/2023  Medication Sig   LORazepam (ATIVAN) 0.5 MG tablet Take 1 tablet (0.5 mg total) by mouth every 6  (six) hours as needed for anxiety.   morphine (ROXANOL) 20 MG/ML concentrated solution Take 0.25 mLs (5 mg total) by mouth every 2 (two) hours as needed for severe pain (pain score 7-10).   acetaminophen (TYLENOL) 325 MG tablet Take 650 mg by mouth 3 (three) times daily.   Calcium Carb-Cholecalciferol (CALCIUM + VITAMIN D3) 500-5 MG-MCG TABS Take 1 tablet by mouth in the morning.   calcium carbonate (TUMS EX) 750 MG chewable tablet Chew 2 tablets by mouth 3 (three) times daily as needed (for indigestion).   Cholecalciferol (VITAMIN D3) 25 MCG (1000 UT) capsule Take 1,000 Units by mouth daily.   cyanocobalamin (VITAMIN B12) 1000 MCG tablet Take 1 tablet (1,000 mcg total) by mouth daily. (Patient taking differently: Take 1,000 mcg by mouth in the morning.)   dapsone 100 MG tablet Take 100 mg by mouth daily.   docusate sodium (COLACE) 100 MG capsule Take 100 mg by mouth daily as needed (for constipation).   escitalopram (LEXAPRO) 10 MG tablet Take 1 tablet (10 mg total) by mouth daily.   ferrous sulfate 325 (65 FE) MG EC tablet Take 325 mg by mouth 2 (two) times daily.   folic acid (FOLVITE) 1 MG tablet Take 1 tablet (1 mg total) by mouth daily. (Patient taking differently: Take 1 mg by mouth in the morning.)   levothyroxine (SYNTHROID) 125 MCG tablet Take 125 mcg by mouth daily before breakfast.   Menthol-Methyl Salicylate (MUSCLE RUB) 10-15 % CREA Apply 1 Application topically as needed for muscle pain.   methylphenidate (RITALIN) 5 MG tablet Take 0.5 tablets (2.5 mg total) by mouth in the morning.   midodrine (PROAMATINE) 2.5 MG tablet Take 1 tablet (2.5 mg total) by mouth 2 (two) times daily with a meal. (Patient taking differently: Take 2.5 mg by mouth 2 (two) times daily with a meal. Hold is SBP >150)   ondansetron (ZOFRAN-ODT) 4 MG disintegrating tablet Take 4 mg by mouth every 6 (six) hours as needed for nausea or vomiting.   pantoprazole (PROTONIX) 40 MG tablet Take 1 tablet (40 mg total) by  mouth 2 (two) times daily before a meal.   Probiotic Product (UP4 PROBIOTICS WOMENS PO) Take 1 capsule by mouth daily. Hyperbiotics pro women wit D manos plus cranberry   tamsulosin (FLOMAX) 0.4 MG CAPS capsule Take 0.4 mg by mouth at bedtime.   valACYclovir (VALTREX) 500 MG tablet Take 500 mg by mouth in the morning.   [DISCONTINUED] prochlorperazine (COMPAZINE) 10 MG tablet Take 1 tablet (10 mg total) by mouth every 6 (six) hours as needed (Nausea or vomiting).   [DISCONTINUED] traMADol (ULTRAM) 50 MG tablet Take 25-50 mg by mouth every 12 (twelve) hours as needed for moderate pain (pain score 4-6) or severe pain (pain score 7-10).   No facility-administered encounter medications on file as of 12/26/2023.    Review of Systems  Unable to perform ROS: Patient unresponsive    Immunization History  Administered Date(s) Administered   DTaP / Hep B / IPV 05/04/2017   DTaP / HiB / IPV 03/06/2018  Fluad Quad(high Dose 65+) 06/09/2019, 06/25/2020, 07/25/2021, 05/30/2022   Fluad Trivalent(High Dose 65+) 08/04/2012, 07/03/2014, 06/16/2023   HIB (PRP-T) 05/04/2017   Hepatitis B, ADULT 03/14/2017, 03/06/2018   IPV 01/28/1998   Influenza, High Dose Seasonal PF 06/16/2018   Influenza,inj,Quad PF,6+ Mos 06/16/2013, 07/03/2014, 06/11/2015, 05/31/2017   Influenza-Unspecified 07/29/2012   Moderna Covid-19 Vaccine Bivalent Booster 44yrs & up 07/10/2023   PFIZER(Purple Top)SARS-COV-2 Vaccination 10/26/2019, 11/20/2019, 07/22/2020   Pfizer Covid-19 Vaccine Bivalent Booster 20yrs & up 06/27/2021   Pfizer(Comirnaty)Fall Seasonal Vaccine 12 years and older 07/05/2022   Pneumococcal Conjugate-13 10/15/2013, 05/04/2017, 03/06/2018   Pneumococcal Polysaccharide-23 06/16/2013   Respiratory Syncytial Virus Vaccine,Recomb Aduvanted(Arexvy) 05/30/2022   Td 02/10/1998, 07/03/2014   Pertinent  Health Maintenance Due  Topic Date Due   Colonoscopy  09/24/2023   INFLUENZA VACCINE  04/18/2024      08/30/2022    11:01 AM 09/06/2022   10:00 AM 02/15/2023    1:29 PM 06/11/2023   11:20 AM 06/27/2023   10:37 AM  Fall Risk  Falls in the past year?   1 0 1  Was there an injury with Fall?   1 0 1  Fall Risk Category Calculator   3 0 3  (RETIRED) Patient Fall Risk Level Low fall risk Low fall risk     Patient at Risk for Falls Due to    History of fall(s) History of fall(s);Impaired balance/gait;Impaired mobility  Fall risk Follow up   Falls evaluation completed Falls evaluation completed Falls evaluation completed;Education provided;Falls prevention discussed   Functional Status Survey:    Vitals:   12/26/23 1518  BP: (!) 101/57  Pulse: 91  Resp: 16  Temp: (!) 101 F (38.3 C)  SpO2: 94%  Weight: 168 lb 12.8 oz (76.6 kg)  Height: 5\' 7"  (1.702 m)   Body mass index is 26.44 kg/m. Physical Exam Vitals reviewed.  Constitutional:      Appearance: He is ill-appearing and diaphoretic.     Comments: Pale complexion  HENT:     Head: Normocephalic.  Eyes:     General:        Right eye: No discharge.        Left eye: No discharge.     Comments: Pupils sluggish  Cardiovascular:     Rate and Rhythm: Tachycardia present.     Pulses: Normal pulses.     Heart sounds: Normal heart sounds.  Pulmonary:     Effort: No respiratory distress.     Breath sounds: Examination of the right-lower field reveals decreased breath sounds. Examination of the left-lower field reveals decreased breath sounds. Decreased breath sounds present. No wheezing, rhonchi or rales.     Comments: tachypnea Abdominal:     General: There is no distension.     Palpations: Abdomen is soft.     Tenderness: There is no abdominal tenderness.     Comments: Hypoactive bowel sounds x 4  Musculoskeletal:     Cervical back: Neck supple.     Right lower leg: No edema.     Left lower leg: No edema.  Skin:    General: Skin is warm.     Capillary Refill: Capillary refill takes less than 2 seconds.     Comments: Forehead hot to touch   Neurological:     Mental Status: He is lethargic.     Motor: Weakness present.     Gait: Gait abnormal.  Psychiatric:        Attention and Perception: He is inattentive.  Speech: He is noncommunicative.     Labs reviewed: Recent Labs    05/23/23 0413 06/06/23 0849 06/16/23 0536 06/17/23 0517 06/27/23 0933 08/02/23 0825 08/16/23 0402 08/17/23 0333 08/30/23 0750 09/27/23 0827  NA 134*   < > 137   < > 139   < > 132* 137 140 138  K 3.7   < > 3.9   < > 3.7   < > 3.3* 3.6 3.9 4.4  CL 101   < > 104   < > 105   < > 98 104 105 104  CO2 25   < > 25   < > 25   < > 27 26 27 25   GLUCOSE 97   < > 92   < > 114*   < > 94 103* 102* 117*  BUN 21   < > 17   < > 16   < > 15 14 11 14   CREATININE 0.91   < > 0.92   < > 0.93   < > 0.82 0.81 0.86 0.89  CALCIUM 8.9   < > 8.8*   < > 9.9   < > 8.8* 8.7* 9.1 9.0  MG 2.1  --  2.3   < > 1.9   < > 2.2 2.2  --  2.2  PHOS 2.4*  --  3.7  --  2.8  --   --   --   --   --    < > = values in this interval not displayed.   Recent Labs    08/11/23 1118 08/30/23 0750 09/27/23 0827  AST 19 14* 14*  ALT 10 9 13   ALKPHOS 64 71 91  BILITOT 0.5 0.5 0.6  PROT 6.2* 5.9* 6.2*  ALBUMIN 3.3* 3.7 4.2   Recent Labs    11/26/23 1303 12/06/23 0802 12/12/23 1101  WBC 2.1* 1.4* 1.4*  NEUTROABS 1.3* 0.6* 0.8*  HGB 6.2* 8.4* 7.4*  HCT 20.7* 27.5* 25.2*  MCV 77.8* 79.7* 81.0  PLT 15* 8* 11*   Lab Results  Component Value Date   TSH 1.47 10/04/2023   Lab Results  Component Value Date   HGBA1C 4.3 (L) 05/06/2023   Lab Results  Component Value Date   CHOL 178 02/13/2023   HDL 42 02/13/2023   LDLCALC 113 02/13/2023   LDLDIRECT 155.8 11/13/2012   TRIG 167 (A) 02/13/2023   CHOLHDL 4 09/06/2017    Significant Diagnostic Results in last 30 days:  No results found.  Assessment/Plan 1. Fever, unspecified fever cause (Primary) - neutropenic precautions due to MM  - fever 101.9 last night> improved with tylenol 650 mg po once - fever 101 this  morning> tylenol suppository> no improvement - family declined ED evaluation or imaging/lab work - diminished lung sounds, hypoactive bowel sounds - consult hospice - morphine (ROXANOL) 20 MG/ML concentrated solution; Take 0.25 mLs (5 mg total) by mouth every 2 (two) hours as needed for severe pain (pain score 7-10).  Dispense: 30 mL; Refill: 0 - LORazepam (ATIVAN) 0.5 MG tablet; Take 1 tablet (0.5 mg total) by mouth every 6 (six) hours as needed for anxiety.  Dispense: 30 tablet; Refill: 1  2. Lethargy - see above - fever 101, pupils sluggish, non responsive to painful stimuli - discontinue po medications  3. Pancytopenia (HCC) - followed by oncology - see above  4. Advanced directives, counseling/discussion - patient DNR, MOST form completed - ED evaluation recommended but declined by family - discussed hospice> family agrees to  consult    Family/ staff Communication: plan discussed with patient and nurse  Labs/tests ordered:  hospice consult

## 2024-01-11 ENCOUNTER — Non-Acute Institutional Stay (SKILLED_NURSING_FACILITY): Payer: Self-pay | Admitting: Adult Health

## 2024-01-11 ENCOUNTER — Ambulatory Visit: Admitting: Oncology

## 2024-01-11 ENCOUNTER — Encounter: Payer: Self-pay | Admitting: Adult Health

## 2024-01-11 ENCOUNTER — Other Ambulatory Visit

## 2024-01-11 DIAGNOSIS — I951 Orthostatic hypotension: Secondary | ICD-10-CM

## 2024-01-11 DIAGNOSIS — C9000 Multiple myeloma not having achieved remission: Secondary | ICD-10-CM

## 2024-01-11 DIAGNOSIS — K922 Gastrointestinal hemorrhage, unspecified: Secondary | ICD-10-CM | POA: Diagnosis not present

## 2024-01-11 DIAGNOSIS — R4189 Other symptoms and signs involving cognitive functions and awareness: Secondary | ICD-10-CM | POA: Diagnosis not present

## 2024-01-11 DIAGNOSIS — F329 Major depressive disorder, single episode, unspecified: Secondary | ICD-10-CM

## 2024-01-11 DIAGNOSIS — D61818 Other pancytopenia: Secondary | ICD-10-CM

## 2024-01-11 DIAGNOSIS — E039 Hypothyroidism, unspecified: Secondary | ICD-10-CM

## 2024-01-11 MED ORDER — ACETAMINOPHEN 325 MG PO TABS: 650.0000 mg | ORAL_TABLET | ORAL | Status: DC | PRN

## 2024-01-11 NOTE — Progress Notes (Signed)
 Location:  Medical illustrator of Service:  SNF (31) Provider:   Janace Mckusick, ANP [facility] [phone]   Marguerite Shiley, MD  Patient Care Team: Marguerite Shiley, MD as PCP - General (Internal Medicine) Lloyd Riley, MD as Consulting Physician (Neurology) Elayne Greaser, MD as Consulting Physician (Internal Medicine) Brian Campanile, MD (Inactive) as Consulting Physician (Neurology) Arna Lanes, DDS as Referring Physician (Oral Surgery) Sumner Ends, MD as Consulting Physician (Oncology) Raylene Calamity, NP as Nurse Practitioner (Nurse Practitioner)  Extended Emergency Contact Information Primary Emergency Contact: Lortz,[name] Address: [address]          [place], [place] [address] [place] Home Phone: [phone] Relation: Spouse  Code Status:  DNR hospice  Goals of care: Advanced Directive information    12/14/2023    1:27 PM  Advanced Directives  Does Patient Have a Medical Advance Directive? Yes  Does patient want to make changes to medical advance directive? No - Patient declined     Chief Complaint  Patient presents with   Medical Management of Chronic Issues    HPI:  Pt is a 73 y.o. male seen today for medical management of chronic diseases.    PMH of multiple myeloma (s/p chemo and CAR T therapy, currently receiving IVIG, followed by oncology), chronic pancytopenia, hypothyroidism, and mild cognitive impairment secondary to CART and chronic microvascular changes.   Now under hospice care due to Multiple myeloma with pancytopenia. Was admitted after fever and weakness but now he has improved and not had further fevers. He has had issues with cyclical fever and recurrent infections. Difficult to identify the exact cause possibly due to his infusion vs his disease process.   At this time he is up in the chair with no acute complaints. He is eating and drinking well. No fever  or low blood pressure.   Off midodrine  for orthostatic hypotension BP 106/65  Had GI bleeding last month not worked up due to goals of care. On protonix  with no further bleeding.   Due to his cognitive impairment he has poor safety awareness and has frequent falls. Last fall 4/20.  Did hit his head. Neuro checks were normal.   Last platelet and PRBC transfusion 12/14/23 followed by oncology Last CBC WBC 1.4, Hgb 7.4, Plt 11 on 12/12/23 prior to transfusion.   Past Medical History:  Diagnosis Date   Anemia    Arthritis    Asthma    no treatment x 20 years   Depression    Double vision    occurs at times    Duodenal ulcer    GERD (gastroesophageal reflux disease)    Hemorrhage of rectum and anus 09/15/2013   Hyperlipidemia    Hypertension    Hypothyroidism    Leukopenia due to antineoplastic chemotherapy (HCC) 02/22/2017   Multiple myeloma 07/04/2011   Thyroid  disease    TIA (transient ischemic attack) 03/27/2012   Transient diplopia 03/27/2012   Transient global amnesia 06/07/2015   Past Surgical History:  Procedure Laterality Date   BONE MARROW TRANSPLANT  2011   for MM   CARDIOLITE STUDY  11/25/2003   NORMAL   TONSILLECTOMY      Allergies  Allergen Reactions   Atorvastatin Other (See Comments)    (LIPITOR) "Allergic," per [facility]   Rosuvastatin Other (See Comments)    (CRESTOR) Liver Disorder (adverse effects) and "Allergic," per [facility]   Beef-Derived Drug Products Other (See Comments)  pescatarian    Chicken Protein Other (See Comments)    pescatarian   Pork-Derived Products Other (See Comments)    pescatarian    Septra  [Sulfamethoxazole -Trimethoprim ] Rash and Other (See Comments)    "Allergic," per [facility]    Outpatient Encounter Medications as of 01/11/2024  Medication Sig   acetaminophen  (TYLENOL ) 325 MG tablet Take 2 tablets (650 mg total) by mouth every 4 (four) hours as needed.   escitalopram  (LEXAPRO ) 10 MG tablet Take 10 mg by mouth daily.   levothyroxine   (SYNTHROID ) 125 MCG tablet Take 125 mcg by mouth daily before breakfast.   pantoprazole  (PROTONIX ) 40 MG tablet Take 40 mg by mouth 2 (two) times daily.   tamsulosin  (FLOMAX ) 0.4 MG CAPS capsule Take 0.4 mg by mouth daily.   LORazepam  (ATIVAN ) 0.5 MG tablet Take 1 tablet (0.5 mg total) by mouth every 6 (six) hours as needed for anxiety.   morphine  (ROXANOL) 20 MG/ML concentrated solution Take 0.25 mLs (5 mg total) by mouth every 2 (two) hours as needed for severe pain (pain score 7-10).   ondansetron  (ZOFRAN -ODT) 4 MG disintegrating tablet Take 4 mg by mouth every 6 (six) hours as needed for nausea or vomiting.   [DISCONTINUED] prochlorperazine  (COMPAZINE ) 10 MG tablet Take 1 tablet (10 mg total) by mouth every 6 (six) hours as needed (Nausea or vomiting).   No facility-administered encounter medications on file as of 01/11/2024.    Review of Systems  Constitutional:  Negative for activity change, appetite change, chills, diaphoresis, fatigue, fever and unexpected weight change.  Respiratory:  Negative for cough, shortness of breath, wheezing and stridor.   Cardiovascular:  Positive for leg swelling. Negative for chest pain and palpitations.  Gastrointestinal:  Negative for abdominal distention, abdominal pain, constipation and diarrhea.  Genitourinary:  Negative for difficulty urinating and dysuria.  Musculoskeletal:  Positive for gait problem. Negative for arthralgias, back pain, joint swelling and myalgias.  Neurological:  Negative for dizziness, seizures, syncope, facial asymmetry, speech difficulty, weakness and headaches.  Hematological:  Negative for adenopathy. Does not bruise/bleed easily.  Psychiatric/Behavioral:  Positive for confusion. Negative for agitation and behavioral problems.     Immunization History  Administered Date(s) Administered   DTaP / Hep B / IPV 05/04/2017   DTaP / HiB / IPV 03/06/2018   Fluad Quad(high Dose 65+) 06/09/2019, 06/25/2020, 07/25/2021, 05/30/2022    Fluad Trivalent(High Dose 65+) 08/04/2012, 07/03/2014, 06/16/2023   HIB (PRP-T) 05/04/2017   Hepatitis B, ADULT 03/14/2017, 03/06/2018   IPV 01/28/1998   Influenza, High Dose Seasonal PF 06/16/2018   Influenza,inj,Quad PF,6+ Mos 06/16/2013, 07/03/2014, 06/11/2015, 05/31/2017   Influenza-Unspecified 07/29/2012   Moderna Covid-19 Vaccine Bivalent Booster 89yrs & up 07/10/2023   PFIZER(Purple Top)SARS-COV-2 Vaccination 10/26/2019, 11/20/2019, 07/22/2020   Pfizer Covid-19 Vaccine Bivalent Booster 73yrs & up 06/27/2021   Pfizer(Comirnaty )Fall Seasonal Vaccine 12 years and older 07/05/2022   Pneumococcal Conjugate-13 10/15/2013, 05/04/2017, 03/06/2018   Pneumococcal Polysaccharide-23 06/16/2013   Respiratory Syncytial Virus Vaccine ,Recomb Aduvanted(Arexvy ) 05/30/2022   Td 02/10/1998, 07/03/2014   Pertinent  Health Maintenance Due  Topic Date Due   Colonoscopy  09/24/2023   INFLUENZA VACCINE  04/18/2024      08/30/2022   11:01 AM 09/06/2022   10:00 AM 02/15/2023    1:29 PM 06/11/2023   11:20 AM 06/27/2023   10:37 AM  Fall Risk  Falls in the past year?   1 0 1  Was there an injury with Fall?   1 0 1  Fall Risk Category Calculator  3 0 3  (RETIRED) Patient Fall Risk Level Low fall risk Low fall risk     Patient at Risk for Falls Due to    History of fall(s) History of fall(s);Impaired balance/gait;Impaired mobility  Fall risk Follow up   Falls evaluation completed Falls evaluation completed Falls evaluation completed;Education provided;Falls prevention discussed   Functional Status Survey:    Vitals:   01/11/24 1320  BP: 106/65  Pulse: 66  Resp: 18  Temp: 97.6 F (36.4 C)  SpO2: 97%  Weight: 168 lb 12.8 oz (76.6 kg)   Body mass index is 26.44 kg/m. Physical Exam Vitals and nursing note reviewed.  Constitutional:      Appearance: Normal appearance.  HENT:     Head: Normocephalic and atraumatic.  Cardiovascular:     Rate and Rhythm: Normal rate and regular rhythm.      Heart sounds: No murmur heard. Pulmonary:     Effort: Pulmonary effort is normal. No respiratory distress.     Breath sounds: Normal breath sounds. No wheezing.  Abdominal:     General: Bowel sounds are normal. There is no distension.     Palpations: Abdomen is soft.     Tenderness: There is no abdominal tenderness.  Musculoskeletal:     Cervical back: No rigidity.     Right lower leg: Edema (trace) present.     Left lower leg: Edema (trace) present.  Lymphadenopathy:     Cervical: No cervical adenopathy.  Skin:    General: Skin is warm and dry.  Neurological:     General: No focal deficit present.     Mental Status: He is alert. Mental status is at baseline.  Psychiatric:        Mood and Affect: Mood normal.     Labs reviewed: Recent Labs    05/23/23 0413 06/06/23 0849 06/16/23 0536 06/17/23 0517 06/27/23 0933 08/02/23 0825 08/16/23 0402 08/17/23 0333 08/30/23 0750 09/27/23 0827  NA 134*   < > 137   < > 139   < > 132* 137 140 138  K 3.7   < > 3.9   < > 3.7   < > 3.3* 3.6 3.9 4.4  CL 101   < > 104   < > 105   < > 98 104 105 104  CO2 25   < > 25   < > 25   < > 27 26 27 25   GLUCOSE 97   < > 92   < > 114*   < > 94 103* 102* 117*  BUN 21   < > 17   < > 16   < > 15 14 11 14   CREATININE 0.91   < > 0.92   < > 0.93   < > 0.82 0.81 0.86 0.89  CALCIUM  8.9   < > 8.8*   < > 9.9   < > 8.8* 8.7* 9.1 9.0  MG 2.1  --  2.3   < > 1.9   < > 2.2 2.2  --  2.2  PHOS 2.4*  --  3.7  --  2.8  --   --   --   --   --    < > = values in this interval not displayed.   Recent Labs    08/11/23 1118 08/30/23 0750 09/27/23 0827  AST 19 14* 14*  ALT 10 9 13   ALKPHOS 64 71 91  BILITOT 0.5 0.5 0.6  PROT 6.2* 5.9* 6.2*  ALBUMIN 3.3*  3.7 4.2   Recent Labs    11/26/23 1303 12/06/23 0802 12/12/23 1101  WBC 2.1* 1.4* 1.4*  NEUTROABS 1.3* 0.6* 0.8*  HGB 6.2* 8.4* 7.4*  HCT 20.7* 27.5* 25.2*  MCV 77.8* 79.7* 81.0  PLT 15* 8* 11*   Lab Results  Component Value Date   TSH 1.47  10/04/2023   Lab Results  Component Value Date   HGBA1C 4.3 (L) 05/06/2023   Lab Results  Component Value Date   CHOL 178 02/13/2023   HDL 42 02/13/2023   LDLCALC 113 02/13/2023   LDLDIRECT 155.8 11/13/2012   TRIG 167 (A) 02/13/2023   CHOLHDL 4 09/06/2017    Significant Diagnostic Results in last 30 days:  No results found.  Assessment/Plan  1. Multiple myeloma, remission status unspecified (HCC) (Primary) Under hospice care Doing well after a period of decline   2. Pancytopenia (HCC) Last transfusion 3/25 Followed by oncology   3. Cognitive impairment With poor safety awareness Now in skilled care  4. Acute GI bleeding No further issues on Protonix    5. Orthostatic hypotension Off midodrine  Could add back if needed.   6. Reactive depression (situational) Lexapro  recently resume Mood stable   7. Acquired hypothyroidism On levothyroxine  Lab Results  Component Value Date   TSH 1.47 10/04/2023     Family/ staff Communication: nurse  Labs/tests ordered:  done at the cancer center

## 2024-02-17 DEATH — deceased
